# Patient Record
Sex: Female | Born: 1970
Health system: Southern US, Community
[De-identification: ages and names within clinical notes are randomized; demographics above are authoritative.]

## PROBLEM LIST (undated history)

## (undated) DIAGNOSIS — Z8049 Family history of malignant neoplasm of other genital organs: Secondary | ICD-10-CM

## (undated) DIAGNOSIS — M47819 Spondylosis without myelopathy or radiculopathy, site unspecified: Secondary | ICD-10-CM

## (undated) DIAGNOSIS — Z803 Family history of malignant neoplasm of breast: Secondary | ICD-10-CM

## (undated) DIAGNOSIS — G4700 Insomnia, unspecified: Secondary | ICD-10-CM

## (undated) DIAGNOSIS — F32A Depression, unspecified: Secondary | ICD-10-CM

## (undated) DIAGNOSIS — G44209 Tension-type headache, unspecified, not intractable: Secondary | ICD-10-CM

## (undated) DIAGNOSIS — F329 Major depressive disorder, single episode, unspecified: Secondary | ICD-10-CM

## (undated) DIAGNOSIS — K219 Gastro-esophageal reflux disease without esophagitis: Secondary | ICD-10-CM

## (undated) DIAGNOSIS — G5690 Unspecified mononeuropathy of unspecified upper limb: Secondary | ICD-10-CM

## (undated) DIAGNOSIS — F419 Anxiety disorder, unspecified: Secondary | ICD-10-CM

## (undated) DIAGNOSIS — Z95828 Presence of other vascular implants and grafts: Secondary | ICD-10-CM

## (undated) DIAGNOSIS — Z8719 Personal history of other diseases of the digestive system: Secondary | ICD-10-CM

## (undated) DIAGNOSIS — D649 Anemia, unspecified: Secondary | ICD-10-CM

## (undated) DIAGNOSIS — K589 Irritable bowel syndrome without diarrhea: Secondary | ICD-10-CM

## (undated) DIAGNOSIS — M722 Plantar fascial fibromatosis: Secondary | ICD-10-CM

## (undated) DIAGNOSIS — E785 Hyperlipidemia, unspecified: Secondary | ICD-10-CM

## (undated) DIAGNOSIS — C569 Malignant neoplasm of unspecified ovary: Secondary | ICD-10-CM

## (undated) DIAGNOSIS — K5900 Constipation, unspecified: Secondary | ICD-10-CM

## (undated) DIAGNOSIS — G43909 Migraine, unspecified, not intractable, without status migrainosus: Secondary | ICD-10-CM

## (undated) DIAGNOSIS — Z87442 Personal history of urinary calculi: Secondary | ICD-10-CM

## (undated) HISTORY — DX: Anxiety disorder, unspecified: F41.9

## (undated) HISTORY — DX: Plantar fascial fibromatosis: M72.2

## (undated) HISTORY — DX: Family history of malignant neoplasm of other genital organs: Z80.49

## (undated) HISTORY — DX: Irritable bowel syndrome, unspecified: K58.9

## (undated) HISTORY — PX: TUBAL LIGATION: SHX77

## (undated) HISTORY — DX: Insomnia, unspecified: G47.00

## (undated) HISTORY — DX: Major depressive disorder, single episode, unspecified: F32.9

## (undated) HISTORY — PX: ABDOMINAL HYSTERECTOMY: SHX81

## (undated) HISTORY — DX: Family history of malignant neoplasm of breast: Z80.3

## (undated) HISTORY — DX: Hyperlipidemia, unspecified: E78.5

## (undated) HISTORY — DX: Depression, unspecified: F32.A

## (undated) HISTORY — PX: OTHER SURGICAL HISTORY: SHX169

## (undated) HISTORY — PX: ESOPHAGOGASTRODUODENOSCOPY: SHX5428

## (undated) HISTORY — DX: Spondylosis without myelopathy or radiculopathy, site unspecified: M47.819

## (undated) HISTORY — DX: Tension-type headache, unspecified, not intractable: G44.209

## (undated) HISTORY — DX: Migraine, unspecified, not intractable, without status migrainosus: G43.909

---
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
## 1998-04-21 ENCOUNTER — Other Ambulatory Visit: Admission: RE | Admit: 1998-04-21 | Discharge: 1998-04-21 | Payer: Self-pay | Admitting: Obstetrics and Gynecology

## 2003-12-01 ENCOUNTER — Ambulatory Visit (HOSPITAL_COMMUNITY): Admission: RE | Admit: 2003-12-01 | Discharge: 2003-12-01 | Payer: Self-pay | Admitting: Obstetrics & Gynecology

## 2005-01-21 ENCOUNTER — Emergency Department (HOSPITAL_COMMUNITY): Admission: EM | Admit: 2005-01-21 | Discharge: 2005-01-21 | Payer: Self-pay | Admitting: Emergency Medicine

## 2006-01-14 HISTORY — PX: CHOLECYSTECTOMY: SHX55

## 2007-12-04 ENCOUNTER — Other Ambulatory Visit: Admission: RE | Admit: 2007-12-04 | Discharge: 2007-12-04 | Payer: Self-pay | Admitting: Obstetrics & Gynecology

## 2009-01-23 ENCOUNTER — Other Ambulatory Visit: Admission: RE | Admit: 2009-01-23 | Discharge: 2009-01-23 | Payer: Self-pay | Admitting: Obstetrics & Gynecology

## 2009-10-30 ENCOUNTER — Ambulatory Visit (HOSPITAL_COMMUNITY): Admission: RE | Admit: 2009-10-30 | Discharge: 2009-10-30 | Payer: Self-pay | Admitting: Obstetrics & Gynecology

## 2010-04-02 DIAGNOSIS — R072 Precordial pain: Secondary | ICD-10-CM

## 2010-06-01 NOTE — Op Note (Signed)
Michelle Holt, Michelle Holt                  ACCOUNT NO.:  [id number]   MEDICAL RECORD NO.:  [id number]          PATIENT TYPE:  AMB   LOCATION:  DAY                           FACILITY:  [facility]   PHYSICIAN:  Lazaro Arms, M.D.   DATE OF BIRTH:  1970/10/28   DATE OF PROCEDURE:  12/01/2003  DATE OF DISCHARGE:                                 OPERATIVE REPORT   PREOPERATIVE DIAGNOSES:  1.  Menometrorrhagia.  2.  Dysmenorrhea.   POSTOPERATIVE DIAGNOSES:  1.  Menometrorrhagia.  2.  Dysmenorrhea.   PROCEDURE:  Hysteroscopy, dilatation and curettage, with endometrial  ablation.   SURGEON:  Lazaro Arms, M.D.   ANESTHESIA:  Laryngeal mask airway.   ESTIMATED BLOOD LOSS:  None.   FINDINGS:  The patient had had prolonged heavy vaginal bleeding for some  time, she responded nicely to Megace, and also painful cramping.  She is  admitted for hysteroscopy, D&C, and endometrial ablation.  At the time of  ablation, she had no abnormal findings.   DESCRIPTION OF OPERATION:  The patient was taken to the operating room and  placed in the supine position, where she underwent a laryngeal mask airway,  placed in the dorsal lithotomy position, and prepped and draped in the usual  sterile fashion.  A Graves speculum was placed.  The cervix was grasped with  a single-tooth tenaculum.  Marcaine 0.5% with 1:200,000 epinephrine was  injected as a paracervical block on either side.  The uterus sounded to 8  cm.  The cervix was dilated serially to allow passage of the hysteroscope.  Hysteroscopy was performed and normal findings were seen.  A vigorous  uterine curettage was performed, specimen was sent to the lab.  Balloon  endometrial ablation was performed.  It required 17 mL of D5W heated to 87  degrees Celsius for a total therapy time of 9 minutes 9 seconds.  All fluid  was recovered at the end of the procedure.  The patient tolerated the  procedure well.  She experienced minimal blood loss, taken to  recovery in  good, stable condition.  All counts were correct.  She received Ancef and  Toradol prophylactically.     Luth  LHE/MEDQ  D:  12/01/2003  T:  12/01/2003  Job:  [id number]

## 2010-06-01 NOTE — H&P (Signed)
NAME:  Michelle Holt                  ACCOUNT NO.:  [id number]   MEDICAL RECORD NO.:  [id number]          PATIENT TYPE:  AMB   LOCATION:  DAY                           FACILITY:  [facility]   PHYSICIAN:  Lazaro Arms, M.D.   DATE OF BIRTH:  28-Jun-1970   DATE OF ADMISSION:  DATE OF DISCHARGE:  LH                                HISTORY & PHYSICAL   HISTORY OF PRESENT ILLNESS:  Michelle Holt is a 40 year old white female, gravida 4,  para 2, AB 2 with last menstrual period August 21, 2003.  She has been on  Megace since that time for suppression of her menses.  The patient presented  initially to our office in early September complaining of a painful  menstrual period and heavy bleeding with clots, significantly disrupting her  life.  As a result. she had been placed on Megace at my request prior to  that time, and she responded nicely to that, being basically amenorrheic.  The patient is status post a tubal and wants definitive therapy for this  problem.   PAST MEDICAL HISTORY:  Negative.   PAST SURGICAL HISTORY:  Tubal ligation in 2002.   ALLERGIES:  CODEINE AND PENICILLIN.   PAST OB HISTORY:  She is a gravida 4, para 2, AB 2.   MEDICATIONS:  Megace.   PHYSICAL EXAMINATION:  HEENT:  Unremarkable.  Thyroid is normal.  LUNGS:  Clear.  HEART:  Regular rate and rhythm without murmurs, rubs or gallops.  BREASTS:  Without masses, discharge or skin changes.  ABDOMEN:  Benign. No hepatosplenomegaly or masses.  GU:  She has normal external genitalia.  The vagina is without discharge.  Cervix is parous without lesions.  The uterus is normal size, shape and  contour.  The ovaries are normal and nontender.   IMPRESSION:  1.  Hypermenorrhea.  2.  Dysmenorrhea.   PLAN:  The patient is admitted for hysterotomy, D&C and endometrial  ablation.  She understands the risks, benefits, indications and alternatives  and will proceed.     Luth   LHE/MEDQ  D:  11/30/2003  T:  11/30/2003  Job:  [id number]   cc:    [facility]  Fax: [phone]

## 2010-09-11 ENCOUNTER — Other Ambulatory Visit (HOSPITAL_COMMUNITY)
Admission: RE | Admit: 2010-09-11 | Discharge: 2010-09-11 | Disposition: A | Discharge status: Home / Self Care | Payer: BC Managed Care – PPO | Source: Ambulatory Visit | Attending: Obstetrics & Gynecology | Admitting: Obstetrics & Gynecology

## 2010-09-11 ENCOUNTER — Other Ambulatory Visit: Payer: Self-pay | Admitting: Obstetrics & Gynecology

## 2010-09-11 DIAGNOSIS — Z01419 Encounter for gynecological examination (general) (routine) without abnormal findings: Secondary | ICD-10-CM | POA: Insufficient documentation

## 2010-11-15 ENCOUNTER — Other Ambulatory Visit: Payer: Self-pay | Admitting: Obstetrics & Gynecology

## 2010-11-15 DIAGNOSIS — Z139 Encounter for screening, unspecified: Secondary | ICD-10-CM

## 2010-11-26 ENCOUNTER — Ambulatory Visit (HOSPITAL_COMMUNITY)
Admission: RE | Admit: 2010-11-26 | Discharge: 2010-11-26 | Disposition: A | Discharge status: Home / Self Care | Payer: BC Managed Care – PPO | Source: Ambulatory Visit | Attending: Obstetrics & Gynecology | Admitting: Obstetrics & Gynecology

## 2010-11-26 DIAGNOSIS — Z1231 Encounter for screening mammogram for malignant neoplasm of breast: Secondary | ICD-10-CM | POA: Insufficient documentation

## 2010-11-26 DIAGNOSIS — Z139 Encounter for screening, unspecified: Secondary | ICD-10-CM

## 2011-10-23 DIAGNOSIS — M47817 Spondylosis without myelopathy or radiculopathy, lumbosacral region: Secondary | ICD-10-CM | POA: Insufficient documentation

## 2011-10-23 DIAGNOSIS — G43019 Migraine without aura, intractable, without status migrainosus: Secondary | ICD-10-CM | POA: Insufficient documentation

## 2011-11-06 ENCOUNTER — Other Ambulatory Visit: Payer: Self-pay | Admitting: Neurology

## 2011-11-06 DIAGNOSIS — M47817 Spondylosis without myelopathy or radiculopathy, lumbosacral region: Secondary | ICD-10-CM

## 2011-11-11 ENCOUNTER — Ambulatory Visit
Admission: RE | Admit: 2011-11-11 | Discharge: 2011-11-11 | Disposition: A | Discharge status: Home / Self Care | Payer: BC Managed Care – PPO | Source: Ambulatory Visit | Attending: Neurology | Admitting: Neurology

## 2011-11-11 DIAGNOSIS — M47817 Spondylosis without myelopathy or radiculopathy, lumbosacral region: Secondary | ICD-10-CM

## 2011-11-11 MED ORDER — IOHEXOL 180 MG/ML  SOLN
1.0000 mL | Freq: Once | INTRAMUSCULAR | Status: AC | PRN
  Administered 2011-11-11: 1 mL via INTRA_ARTICULAR

## 2011-12-19 ENCOUNTER — Other Ambulatory Visit: Payer: Self-pay | Admitting: Obstetrics & Gynecology

## 2011-12-19 DIAGNOSIS — Z139 Encounter for screening, unspecified: Secondary | ICD-10-CM

## 2011-12-23 ENCOUNTER — Ambulatory Visit (HOSPITAL_COMMUNITY)
Admission: RE | Admit: 2011-12-23 | Discharge: 2011-12-23 | Disposition: A | Discharge status: Home / Self Care | Payer: BC Managed Care – PPO | Source: Ambulatory Visit | Attending: Obstetrics & Gynecology | Admitting: Obstetrics & Gynecology

## 2011-12-23 ENCOUNTER — Other Ambulatory Visit: Payer: Self-pay | Admitting: Obstetrics & Gynecology

## 2011-12-23 ENCOUNTER — Other Ambulatory Visit (HOSPITAL_COMMUNITY)
Admission: RE | Admit: 2011-12-23 | Discharge: 2011-12-23 | Disposition: A | Discharge status: Home / Self Care | Payer: BC Managed Care – PPO | Source: Ambulatory Visit | Attending: Obstetrics & Gynecology | Admitting: Obstetrics & Gynecology

## 2011-12-23 DIAGNOSIS — Z139 Encounter for screening, unspecified: Secondary | ICD-10-CM

## 2011-12-23 DIAGNOSIS — R922 Inconclusive mammogram: Secondary | ICD-10-CM | POA: Insufficient documentation

## 2011-12-23 DIAGNOSIS — Z01419 Encounter for gynecological examination (general) (routine) without abnormal findings: Secondary | ICD-10-CM | POA: Insufficient documentation

## 2011-12-25 ENCOUNTER — Other Ambulatory Visit: Payer: Self-pay | Admitting: Obstetrics & Gynecology

## 2011-12-25 DIAGNOSIS — R928 Other abnormal and inconclusive findings on diagnostic imaging of breast: Secondary | ICD-10-CM

## 2012-01-07 ENCOUNTER — Ambulatory Visit (INDEPENDENT_AMBULATORY_CARE_PROVIDER_SITE_OTHER): Payer: BC Managed Care – PPO | Admitting: Urology

## 2012-01-07 DIAGNOSIS — N281 Cyst of kidney, acquired: Secondary | ICD-10-CM

## 2012-01-29 ENCOUNTER — Ambulatory Visit (HOSPITAL_COMMUNITY)
Admission: RE | Admit: 2012-01-29 | Discharge: 2012-01-29 | Disposition: A | Discharge status: Home / Self Care | Payer: BC Managed Care – PPO | Source: Ambulatory Visit | Attending: Obstetrics & Gynecology | Admitting: Obstetrics & Gynecology

## 2012-01-29 ENCOUNTER — Other Ambulatory Visit (HOSPITAL_COMMUNITY): Payer: Self-pay | Admitting: Obstetrics & Gynecology

## 2012-01-29 DIAGNOSIS — R928 Other abnormal and inconclusive findings on diagnostic imaging of breast: Secondary | ICD-10-CM

## 2012-04-30 ENCOUNTER — Other Ambulatory Visit: Payer: Self-pay | Admitting: Neurology

## 2012-05-01 NOTE — Telephone Encounter (Signed)
Refill WID (Dr Anne Hahn out of office)

## 2012-05-07 ENCOUNTER — Encounter: Payer: Self-pay | Admitting: Neurology

## 2012-05-07 DIAGNOSIS — G43019 Migraine without aura, intractable, without status migrainosus: Secondary | ICD-10-CM

## 2012-05-07 DIAGNOSIS — M47817 Spondylosis without myelopathy or radiculopathy, lumbosacral region: Secondary | ICD-10-CM

## 2012-05-08 ENCOUNTER — Encounter: Payer: Self-pay | Admitting: Neurology

## 2012-05-08 ENCOUNTER — Ambulatory Visit (INDEPENDENT_AMBULATORY_CARE_PROVIDER_SITE_OTHER): Payer: Self-pay | Admitting: Neurology

## 2012-05-08 VITALS — BP 104/80 | HR 68 | Ht 65.5 in | Wt 162.0 lb

## 2012-05-08 DIAGNOSIS — G43019 Migraine without aura, intractable, without status migrainosus: Secondary | ICD-10-CM

## 2012-05-08 DIAGNOSIS — M47817 Spondylosis without myelopathy or radiculopathy, lumbosacral region: Secondary | ICD-10-CM

## 2012-05-08 MED ORDER — VERAPAMIL HCL ER 120 MG PO TBCR: 120.0000 mg | EXTENDED_RELEASE_TABLET | Freq: Every day | ORAL | Status: DC

## 2012-05-08 MED ORDER — DIAZEPAM 5 MG PO TABS: 5.0000 mg | ORAL_TABLET | Freq: Every evening | ORAL | Status: DC | PRN

## 2012-05-08 MED ORDER — SUMATRIPTAN 20 MG/ACT NA SOLN: 1.0000 | NASAL | Status: DC | PRN

## 2012-05-08 MED ORDER — OXYCODONE-ACETAMINOPHEN 7.5-325 MG PO TABS: 1.0000 | ORAL_TABLET | Freq: Four times a day (QID) | ORAL | Status: DC | PRN

## 2012-05-08 NOTE — Progress Notes (Signed)
Reason for visit: Migraine  Michelle Holt is an 42 y.o. female  History of present illness:  [name] is a 42 year old right-handed white female with a history of migraine headaches. The patient has been on nortriptyline, currently taking 60 mg at night. The patient indicates that she is unable to tolerate higher doses. The patient is having a lot of blurred vision and dry mouth from the medication, but she indicates that the nortriptyline helps her sleep at night, and it has reduced the frequency of her headache. The patient is having on average about 5 headaches a month, but the headaches may make her feel bad for 2 days. The patient is not missing much work at this time because of the headache. The patient had evidence of L5-S1 facet joint arthritis, worse on the left, and she received an injection into the facet joint in the fall of 2013 with good improvement in her back pain. The patient is still working long hours, and this is adding stress to her life. The patient returns for an evaluation.  Past Medical History  Diagnosis Date  . Migraine   . Muscle tension headache   . Anxiety and depression   . Dyslipidemia   . Irritable bowel syndrome   . Insomnia   . Arthritis of facet joints at multiple vertebral levels     L5-S1    Past Surgical History  Procedure Laterality Date  . Cholecystectomy    . Uterine ablation    . Tubal ligation Bilateral     Family History  Problem Relation Age of Onset  . Hypertension Mother   . Obesity Mother   . Peripheral vascular disease Father   . Cancer - Ovarian Sister   . COPD Brother   . Osteoporosis Brother     Social history:  reports that she has been smoking Cigarettes.  She has a 8.5 pack-year smoking history. She has never used smokeless tobacco. She reports that  drinks alcohol. She reports that she does not use illicit drugs.  Allergies:  Allergies  Allergen Reactions  . Zithromax (Azithromycin)   . Actifed Cold-Allergy  (Chlorpheniramine-Phenyleph Er) Rash  . Amoxicillin Rash  . Codeine Hives  . Erythromycin Rash  . Penicillins Rash  . Sudafed (Pseudoephedrine Hcl) Rash    Medications:  Current Outpatient Prescriptions on File Prior to Visit  Medication Sig Dispense Refill  . nortriptyline (PAMELOR) 25 MG capsule Take 25 mg by mouth 2 (two) times daily. TAKE TWO (2) TABLETS AT BEDTIME      . promethazine (PHENERGAN) 25 MG tablet Take 25 mg by mouth every 6 (six) hours as needed for nausea.      . SUMAtriptan (IMITREX) 100 MG tablet Take 100 mg by mouth 2 (two) times daily as needed for migraine.      . traMADol (ULTRAM) 50 MG tablet Take 50 mg by mouth every 6 (six) hours as needed for pain.       No current facility-administered medications on file prior to visit.    ROS:  Out of a complete 14 system review of symptoms, the patient complains only of the following symptoms, and all other reviewed systems are negative.  Weight gain Snoring Constipation Increased thirst, dry mouth  Blood pressure 104/80, pulse 68, height 5' 5.5" (1.664 m), weight 162 lb (73.483 kg).  Physical Exam  General: The patient is alert and cooperative at the time of the examination. The patient is minimally obese.  Skin: No significant peripheral edema  is noted.   Neurologic Exam  Cranial nerves: Facial symmetry is present. Speech is normal, no aphasia or dysarthria is noted. Extraocular movements are full. Visual fields are full.  Motor: The patient has good strength in all 4 extremities.  Coordination: The patient has good finger-nose-finger and heel-to-shin bilaterally.  Gait and station: The patient has a normal gait. Tandem gait is normal. Romberg is negative. No drift is seen.  Reflexes: Deep tendon reflexes are symmetric.   Assessment/Plan:  1. Migraine headache  2. Low back pain, L5-S1 facet joint arthritis, left greater than right  The patient is improving some with the headache, but the  headache remains a problem. The patient indicates that often times she will throw up the Imitrex tablet during the headache. The patient will be switched to the Imitrex nasal spray. In the past, she could not afford the injectable Imitrex. The patient in the past has not responded well to Botox, and she has been on multiple medications such as propranolol without benefit. The patient will be placed on verapamil taking 120 mg daily. The patient will be given the Imitrex nasal spray, and she will followup in 4 or 5 months. The patient will contact our office if there are tolerance issues with the verapamil.  Marlan Palau MD 05/11/2012 8:43 AM  [facility] [address] [place], [place] [address]  Phone [phone] Fax [phone]

## 2012-07-02 ENCOUNTER — Other Ambulatory Visit: Payer: Self-pay

## 2012-07-02 MED ORDER — VERAPAMIL HCL ER 120 MG PO TBCR: 120.0000 mg | EXTENDED_RELEASE_TABLET | Freq: Every day | ORAL | Status: DC

## 2012-07-06 ENCOUNTER — Telehealth: Payer: Self-pay

## 2012-07-06 NOTE — Telephone Encounter (Signed)
I returned this patients call and got her voice mail. I requested that patient call back and we will look at scheduling her for another appointment.

## 2012-07-06 NOTE — Telephone Encounter (Signed)
Message copied by [facility] on Mon Jul 06, 2012  4:22 PM ------      Message from: Eugenie Birks      Created: Fri Jul 03, 2012  8:29 AM      Contact: patient       Pt states she needs another cortizone shot in her back because she can barely walk and the pain is radiating all the way down to her foot. ------

## 2012-07-10 ENCOUNTER — Telehealth: Payer: Self-pay

## 2012-07-10 DIAGNOSIS — M545 Low back pain, unspecified: Secondary | ICD-10-CM

## 2012-07-10 NOTE — Telephone Encounter (Signed)
This is a patient of Dr. Anne Hahn. He referred her for Cortisone injections last year. She found that they were very effective. After her last visit with Dr. Anne Hahn where she said she was feeling good, the pain began again. She is requesting a new referral be made as she has now been in significant pain for the last two months. Thank you.

## 2012-07-10 NOTE — Telephone Encounter (Signed)
I will reorder the injection on the left L4-5 facet joint.

## 2012-07-14 ENCOUNTER — Other Ambulatory Visit: Payer: Self-pay | Admitting: Neurology

## 2012-07-14 DIAGNOSIS — M545 Low back pain, unspecified: Secondary | ICD-10-CM

## 2012-07-15 ENCOUNTER — Other Ambulatory Visit: Payer: Self-pay | Admitting: Obstetrics & Gynecology

## 2012-07-15 DIAGNOSIS — Z09 Encounter for follow-up examination after completed treatment for conditions other than malignant neoplasm: Secondary | ICD-10-CM

## 2012-07-29 ENCOUNTER — Ambulatory Visit (HOSPITAL_COMMUNITY)
Admission: RE | Admit: 2012-07-29 | Discharge: 2012-07-29 | Disposition: A | Discharge status: Home / Self Care | Payer: BC Managed Care – PPO | Source: Ambulatory Visit | Attending: Obstetrics & Gynecology | Admitting: Obstetrics & Gynecology

## 2012-07-29 ENCOUNTER — Ambulatory Visit
Admission: RE | Admit: 2012-07-29 | Discharge: 2012-07-29 | Disposition: A | Discharge status: Home / Self Care | Payer: BC Managed Care – PPO | Source: Ambulatory Visit | Attending: Neurology | Admitting: Neurology

## 2012-07-29 DIAGNOSIS — M545 Low back pain, unspecified: Secondary | ICD-10-CM

## 2012-07-29 DIAGNOSIS — Z09 Encounter for follow-up examination after completed treatment for conditions other than malignant neoplasm: Secondary | ICD-10-CM

## 2012-07-29 DIAGNOSIS — R928 Other abnormal and inconclusive findings on diagnostic imaging of breast: Secondary | ICD-10-CM | POA: Insufficient documentation

## 2012-07-29 MED ORDER — IOHEXOL 180 MG/ML  SOLN
1.0000 mL | Freq: Once | INTRAMUSCULAR | Status: AC | PRN
  Administered 2012-07-29: 1 mL via EPIDURAL

## 2012-07-29 MED ORDER — METHYLPREDNISOLONE ACETATE 40 MG/ML INJ SUSP (RADIOLOG: 120.0000 mg | Freq: Once | INTRAMUSCULAR | Status: DC

## 2012-07-30 DIAGNOSIS — Z0289 Encounter for other administrative examinations: Secondary | ICD-10-CM

## 2012-08-10 ENCOUNTER — Telehealth: Payer: Self-pay | Admitting: Neurology

## 2012-08-10 ENCOUNTER — Other Ambulatory Visit: Payer: Self-pay | Admitting: Neurology

## 2012-08-10 MED ORDER — OXYCODONE-ACETAMINOPHEN 7.5-325 MG PO TABS: 1.0000 | ORAL_TABLET | Freq: Four times a day (QID) | ORAL | Status: DC | PRN

## 2012-08-10 NOTE — Telephone Encounter (Signed)
The patient called and left a message to indicate that she is increasing her nortriptyline to 70 mg at night. The patient may have another refill on her oxycodone. I'll order this. I did not call the patient.

## 2012-08-10 NOTE — Telephone Encounter (Signed)
Pharmacy sent request for Ultram.  Would you like to fill?  Please advise.  Thank you.

## 2012-08-10 NOTE — Telephone Encounter (Signed)
needs to let Dr. Anne Hahn know her daily headaches have come back and she is increasing her nortriptyline to 70mg . she is also out of her percocet because she used some before she had her shot for her leg and back. please refill. please mail RX.

## 2012-08-28 ENCOUNTER — Encounter: Payer: Self-pay | Admitting: Neurology

## 2012-08-28 ENCOUNTER — Ambulatory Visit (INDEPENDENT_AMBULATORY_CARE_PROVIDER_SITE_OTHER): Payer: BC Managed Care – PPO | Admitting: Neurology

## 2012-08-28 VITALS — BP 102/69 | HR 85 | Ht 66.0 in | Wt 162.0 lb

## 2012-08-28 DIAGNOSIS — G43019 Migraine without aura, intractable, without status migrainosus: Secondary | ICD-10-CM

## 2012-08-28 DIAGNOSIS — M47817 Spondylosis without myelopathy or radiculopathy, lumbosacral region: Secondary | ICD-10-CM

## 2012-08-28 MED ORDER — NORTRIPTYLINE HCL 75 MG PO CAPS: 75.0000 mg | ORAL_CAPSULE | Freq: Every day | ORAL | Status: DC

## 2012-08-28 NOTE — Progress Notes (Signed)
Reason for visit: Headache  Michelle Holt is an 42 y.o. female  History of present illness:  Michelle Holt is a 42 year old right-handed white female with a history of migraine headache, muscle tension headache, and lumbosacral spondylosis. The patient has significant L5-S1 level facet joint arthritis that has responded in the past to injections. The patient recently was diagnosed with right-sided plantar fasciitis, and a heel spur. The patient received an injection for this, and she is doing somewhat better. The patient was placed on diclofenac for the plantar fasciitis. The patient has recently been under a lot of stress with an illness involving her mother. The patient had worsening of headaches, and the nortriptyline dose was increased to 70 mg at night. The patient has had an improvement in the daily headaches, but she continues to have occasional migraine. The patient takes Imitrex for migraine, with some benefit. The patient returns to this office for an evaluation.  Past Medical History  Diagnosis Date  . Migraine   . Muscle tension headache   . Anxiety and depression   . Dyslipidemia   . Irritable bowel syndrome   . Insomnia   . Arthritis of facet joints at multiple vertebral levels     L5-S1  . Plantar fasciitis of right foot     Past Surgical History  Procedure Laterality Date  . Cholecystectomy    . Uterine ablation    . Tubal ligation Bilateral     Family History  Problem Relation Age of Onset  . Hypertension Mother   . Obesity Mother   . Peripheral vascular disease Father   . Cancer - Ovarian Sister   . COPD Brother   . Osteoporosis Brother     Social history:  reports that she has been smoking Cigarettes.  She has a 8.5 pack-year smoking history. She has never used smokeless tobacco. She reports that  drinks alcohol. She reports that she does not use illicit drugs.  Allergies:  Allergies  Allergen Reactions  . Actifed Cold-Allergy [Chlorpheniramine-Phenyleph  Er] Rash  . Amoxicillin Rash  . Codeine Hives  . Erythromycin Rash  . Penicillins Rash  . Sudafed [Pseudoephedrine Hcl] Rash  . Zithromax [Azithromycin] Rash    Medications:  Current Outpatient Prescriptions on File Prior to Visit  Medication Sig Dispense Refill  . diazepam (VALIUM) 5 MG tablet Take 1 tablet (5 mg total) by mouth at bedtime as needed for anxiety.  30 tablet  5  . oxyCODONE-acetaminophen (PERCOCET) 7.5-325 MG per tablet Take 1 tablet by mouth every 6 (six) hours as needed for pain (Must last 28 days).  40 tablet  0  . promethazine (PHENERGAN) 25 MG tablet Take 25 mg by mouth every 6 (six) hours as needed for nausea.      . SUMAtriptan (IMITREX) 100 MG tablet Take 100 mg by mouth 2 (two) times daily as needed for migraine.      . SUMAtriptan (IMITREX) 20 MG/ACT nasal spray Place 1 spray (20 mg total) into the nose every 2 (two) hours as needed for migraine (No more than 2 sprays in a 24 hour period).  1 Inhaler  3  . traMADol (ULTRAM) 50 MG tablet TAKE 1 TABLET BY MOUTH EVERY 6 HOURS AS NEEDED **MUST LAST 28 DAYS  50 tablet  5  . verapamil (CALAN-SR) 120 MG CR tablet Take 1 tablet (120 mg total) by mouth at bedtime.  90 tablet  1   No current facility-administered medications on file prior to visit.  ROS:  Out of a complete 14 system review of symptoms, the patient complains only of the following symptoms, and all other reviewed systems are negative.  Constipation Headache Snoring  Blood pressure 102/69, pulse 85, height 5\' 6"  (1.676 m), weight 162 lb (73.483 kg).  Physical Exam  General: The patient is alert and cooperative at the time of the examination. The patient is minimally obese.  Neuromuscular: The patient has good range of movement of the low back.  Skin: No significant peripheral edema is noted.   Neurologic Exam  Cranial nerves: Facial symmetry is present. Speech is normal, no aphasia or dysarthria is noted. Extraocular movements are full.  Visual fields are full.  Motor: The patient has good strength in all 4 extremities.  Coordination: The patient has good finger-nose-finger and heel-to-shin bilaterally.  Gait and station: The patient has a normal gait. Tandem gait is normal. Romberg is negative. No drift is seen.  Reflexes: Deep tendon reflexes are symmetric.   Assessment/Plan:  One. Migraine headache  2. Muscle tension headache  3. Lumbosacral spondylosis, low back pain  4. Plantar fasciitis, right  The patient is doing better at this time. The patient will be placed on a 75 mg capsule of nortriptyline, and she will continue taking Imitrex if needed. The patient has oxycodone for pain if needed. The patient will followup through this office in 6 months. The patient has received good benefit from the steroid injections of the low back. The last injection was on 07/14/2012, injecting the left L5-S1 facet joint.  Marlan Palau MD 08/30/2012 2:33 PM  [facility] [address] [place], [place] [address]  Phone [phone] Fax [phone]

## 2012-09-20 ENCOUNTER — Other Ambulatory Visit: Payer: Self-pay | Admitting: Neurology

## 2012-09-22 ENCOUNTER — Other Ambulatory Visit: Payer: Self-pay

## 2012-09-22 MED ORDER — TRAMADOL HCL 50 MG PO TABS: 50.0000 mg | ORAL_TABLET | Freq: Four times a day (QID) | ORAL | Status: DC | PRN

## 2012-09-22 NOTE — Telephone Encounter (Signed)
Rx signed and faxed.

## 2012-09-22 NOTE — Telephone Encounter (Signed)
[facility] sent a fax saying they need a new Rx for Tramadol, as it is now a C-IV controlled substance (as of 08/31/2012).  The Rx they had on file had to be voided because it was dated prior to this change.

## 2012-09-29 ENCOUNTER — Ambulatory Visit: Payer: BC Managed Care – PPO | Admitting: Neurology

## 2012-10-08 ENCOUNTER — Other Ambulatory Visit: Payer: Self-pay | Admitting: Neurology

## 2012-10-22 ENCOUNTER — Other Ambulatory Visit: Payer: Self-pay | Admitting: Neurology

## 2012-11-23 ENCOUNTER — Telehealth: Payer: Self-pay | Admitting: Neurology

## 2012-11-27 ENCOUNTER — Other Ambulatory Visit: Payer: Self-pay

## 2012-11-27 MED ORDER — OXYCODONE-ACETAMINOPHEN 7.5-325 MG PO TABS: 1.0000 | ORAL_TABLET | Freq: Four times a day (QID) | ORAL | Status: DC | PRN

## 2012-11-27 NOTE — Telephone Encounter (Signed)
Patient requests that Rx be mailed.

## 2012-11-30 NOTE — Telephone Encounter (Signed)
I have not had access to Epic for over 2 weeks.  By viewing the chart, this Rx was processed on 11/14.

## 2012-12-01 ENCOUNTER — Other Ambulatory Visit: Payer: Self-pay | Admitting: Neurology

## 2012-12-01 MED ORDER — OXYCODONE-ACETAMINOPHEN 7.5-325 MG PO TABS: 1.0000 | ORAL_TABLET | Freq: Four times a day (QID) | ORAL | Status: DC | PRN

## 2012-12-01 NOTE — Progress Notes (Signed)
The oxycodone prescription was rewritten today. The patient never received the prescription from 11/27/2012.

## 2012-12-02 ENCOUNTER — Other Ambulatory Visit: Payer: Self-pay | Admitting: Obstetrics & Gynecology

## 2012-12-02 ENCOUNTER — Other Ambulatory Visit: Payer: Self-pay | Admitting: Neurology

## 2012-12-02 DIAGNOSIS — Z139 Encounter for screening, unspecified: Secondary | ICD-10-CM

## 2012-12-02 NOTE — Telephone Encounter (Signed)
Faxed to pharmacy

## 2012-12-02 NOTE — Telephone Encounter (Signed)
Diazepam faxed to pharmacy. 

## 2012-12-02 NOTE — Telephone Encounter (Signed)
Patient has received Rx

## 2012-12-28 ENCOUNTER — Ambulatory Visit (HOSPITAL_COMMUNITY): Payer: BC Managed Care – PPO

## 2013-01-04 ENCOUNTER — Ambulatory Visit (HOSPITAL_COMMUNITY)
Admission: RE | Admit: 2013-01-04 | Discharge: 2013-01-04 | Disposition: A | Discharge status: Home / Self Care | Payer: BC Managed Care – PPO | Source: Ambulatory Visit | Attending: Obstetrics & Gynecology | Admitting: Obstetrics & Gynecology

## 2013-01-04 DIAGNOSIS — Z139 Encounter for screening, unspecified: Secondary | ICD-10-CM

## 2013-01-04 DIAGNOSIS — Z1231 Encounter for screening mammogram for malignant neoplasm of breast: Secondary | ICD-10-CM | POA: Insufficient documentation

## 2013-01-19 ENCOUNTER — Encounter: Payer: Self-pay | Admitting: Obstetrics & Gynecology

## 2013-01-19 ENCOUNTER — Ambulatory Visit (INDEPENDENT_AMBULATORY_CARE_PROVIDER_SITE_OTHER): Payer: BC Managed Care – PPO | Admitting: Obstetrics & Gynecology

## 2013-01-19 ENCOUNTER — Other Ambulatory Visit (HOSPITAL_COMMUNITY)
Admission: RE | Admit: 2013-01-19 | Discharge: 2013-01-19 | Disposition: A | Discharge status: Home / Self Care | Payer: BC Managed Care – PPO | Source: Ambulatory Visit | Attending: Obstetrics & Gynecology | Admitting: Obstetrics & Gynecology

## 2013-01-19 VITALS — BP 102/80 | Ht 63.4 in | Wt 171.0 lb

## 2013-01-19 DIAGNOSIS — Z1151 Encounter for screening for human papillomavirus (HPV): Secondary | ICD-10-CM | POA: Insufficient documentation

## 2013-01-19 DIAGNOSIS — Z01419 Encounter for gynecological examination (general) (routine) without abnormal findings: Secondary | ICD-10-CM | POA: Insufficient documentation

## 2013-01-19 NOTE — Progress Notes (Signed)
Patient ID: Michelle Holt, female   DOB: 1970/07/16, 43 y.o.   MRN: [id number] Subjective:     Michelle Holt is a 43 y.o. female here for a routine exam.  No LMP recorded. Patient has had an ablation. No obstetric history on file. Birth Control Method:  Ablation  Menstrual Calendar(currently): amenorrheic  Current complaints: none.   Current acute medical issues:  migraines   Recent Gynecologic History No LMP recorded. Patient has had an ablation. Last Pap: 2013,  normal Last mammogram: 12/2012,  normal  Past Medical History  Diagnosis Date  . Migraine   . Muscle tension headache   . Anxiety and depression   . Dyslipidemia   . Irritable bowel syndrome   . Insomnia   . Arthritis of facet joints at multiple vertebral levels     L5-S1  . Plantar fasciitis of right foot     Past Surgical History  Procedure Laterality Date  . Cholecystectomy    . Uterine ablation    . Tubal ligation Bilateral     OB History   Grav Para Term Preterm Abortions TAB SAB Ect Mult Living                  History   Social History  . Marital Status: Divorced    Spouse Name: N/A    Number of Children: 2  . Years of Education: 2-College   Occupational History  .  [employer]   Social History Main Topics  . Smoking status: Current Every Day Smoker -- 0.50 packs/day for 17 years    Types: Cigarettes  . Smokeless tobacco: Never Used  . Alcohol Use: Yes     Comment: Drinks alcohol on occasion  . Drug Use: No  . Sexual Activity: None   Other Topics Concern  . None   Social History Narrative  . None    Family History  Problem Relation Age of Onset  . Hypertension Mother   . Obesity Mother   . Diabetes Mother   . Peripheral vascular disease Father   . Cancer - Ovarian Sister   . COPD Brother   . Osteoporosis Brother      Review of Systems  Review of Systems  Constitutional: Negative for fever, chills, weight loss, malaise/fatigue and diaphoresis.  HENT: Negative for  hearing loss, ear pain, nosebleeds, congestion, sore throat, neck pain, tinnitus and ear discharge.   Eyes: Negative for blurred vision, double vision, photophobia, pain, discharge and redness.  Respiratory: Negative for cough, hemoptysis, sputum production, shortness of breath, wheezing and stridor.   Cardiovascular: Negative for chest pain, palpitations, orthopnea, claudication, leg swelling and PND.  Gastrointestinal: negative for abdominal pain. Negative for heartburn, nausea, vomiting, diarrhea, constipation, blood in stool and melena.  Genitourinary: Negative for dysuria, urgency, frequency, hematuria and flank pain.  Musculoskeletal: Negative for myalgias, back pain, joint pain and falls.  Skin: Negative for itching and rash.  Neurological: Negative for dizziness, tingling, tremors, sensory change, speech change, focal weakness, seizures, loss of consciousness, weakness and headaches.  Endo/Heme/Allergies: Negative for environmental allergies and polydipsia. Does not bruise/bleed easily.  Psychiatric/Behavioral: Negative for depression, suicidal ideas, hallucinations, memory loss and substance abuse. The patient is not nervous/anxious and does not have insomnia.        Objective:    Physical Exam  Vitals reviewed. Constitutional: She is oriented to person, place, and time. She appears well-developed and well-nourished.  HENT:  Head: Normocephalic and atraumatic.  Right Ear: External ear normal.  Left Ear: External ear normal.  Nose: Nose normal.  Mouth/Throat: Oropharynx is clear and moist.  Eyes: Conjunctivae and EOM are normal. Pupils are equal, round, and reactive to light. Right eye exhibits no discharge. Left eye exhibits no discharge. No scleral icterus.  Neck: Normal range of motion. Neck supple. No tracheal deviation present. No thyromegaly present.  Cardiovascular: Normal rate, regular rhythm, normal heart sounds and intact distal pulses.  Exam reveals no gallop and  no friction rub.   No murmur heard. Respiratory: Effort normal and breath sounds normal. No respiratory distress. She has no wheezes. She has no rales. She exhibits no tenderness.  GI: Soft. Bowel sounds are normal. She exhibits no distension and no mass. There is no tenderness. There is no rebound and no guarding.  Genitourinary:  Breasts no masses skin changes or nipple changes bilaterally      Vulva is normal without lesions Vagina is pink moist without discharge Cervix normal in appearance and pap is done Uterus is normal size shape and contour Adnexa is negative with normal sized ovaries    Musculoskeletal: Normal range of motion. She exhibits no edema and no tenderness.  Neurological: She is alert and oriented to person, place, and time. She has normal reflexes. She displays normal reflexes. No cranial nerve deficit. She exhibits normal muscle tone. Coordination normal.  Skin: Skin is warm and dry. No rash noted. No erythema. No pallor.  Psychiatric: She has a normal mood and affect. Her behavior is normal. Judgment and thought content normal.       Assessment:    Healthy female exam.    Plan:    Follow up in: 1 year.

## 2013-02-03 DIAGNOSIS — Z0289 Encounter for other administrative examinations: Secondary | ICD-10-CM

## 2013-02-16 ENCOUNTER — Telehealth: Payer: Self-pay | Admitting: Neurology

## 2013-02-16 NOTE — Telephone Encounter (Signed)
LMVM for pt that FMLA sent to MD for signature.  Did receive fax # and attention to C Wright.

## 2013-02-16 NOTE — Telephone Encounter (Signed)
Patient calling to check on status of FMLA paperwork, and also is leaving the information that it needs to be to the attention of Gabriel Rung and faxed over to [phone].

## 2013-03-01 ENCOUNTER — Ambulatory Visit (INDEPENDENT_AMBULATORY_CARE_PROVIDER_SITE_OTHER): Payer: BC Managed Care – PPO | Admitting: Nurse Practitioner

## 2013-03-01 ENCOUNTER — Encounter (INDEPENDENT_AMBULATORY_CARE_PROVIDER_SITE_OTHER): Payer: Self-pay

## 2013-03-01 ENCOUNTER — Encounter: Payer: Self-pay | Admitting: Nurse Practitioner

## 2013-03-01 VITALS — BP 116/73 | HR 95 | Ht 66.0 in | Wt 176.0 lb

## 2013-03-01 DIAGNOSIS — G43019 Migraine without aura, intractable, without status migrainosus: Secondary | ICD-10-CM

## 2013-03-01 MED ORDER — TOPIRAMATE 25 MG PO TABS: ORAL_TABLET | ORAL | Status: DC

## 2013-03-01 MED ORDER — OXYCODONE-ACETAMINOPHEN 7.5-325 MG PO TABS: 1.0000 | ORAL_TABLET | Freq: Four times a day (QID) | ORAL | Status: DC | PRN

## 2013-03-01 MED ORDER — NORTRIPTYLINE HCL 75 MG PO CAPS: 75.0000 mg | ORAL_CAPSULE | Freq: Every day | ORAL | Status: DC

## 2013-03-01 MED ORDER — RIZATRIPTAN BENZOATE 10 MG PO TBDP: 10.0000 mg | ORAL_TABLET | ORAL | Status: DC | PRN

## 2013-03-01 NOTE — Progress Notes (Signed)
I have read the note, and I agree with the clinical assessment and plan.  Michelle Holt,Michelle Holt   

## 2013-03-01 NOTE — Patient Instructions (Addendum)
Topamax 25 mg at night for 1 week then increase by 25mg  weekly until dose is 100mg  Try Maxalt MLT acutely Continue Nortriptyline at current dose Continue Percocet prn  Set up for sleep study

## 2013-03-01 NOTE — Progress Notes (Signed)
[facility]  PATIENT: Michelle Holt DOB: Sep 17, 1970   REASON FOR VISIT: follow up for migraine   HISTORY OF PRESENT ILLNESS:Ms Michelle Holt, 43 year old female returns for follow up. She has a history of migraine. She was last seen by Dr. Jannifer Holt 08/28/12.  She says today her headaches have been worse in the last 4-5 months. Her mother died then her mother-in-law is currently in hospice and her husband just diagnosed with lung cancer. She says stress is  a big trigger for her. She also complains of weight gain and snoring with morning headaches.She is a shift Insurance underwriter. She does not feel the Imitrex works as well as it has in the past. She wants to try Maxalt melt again. She has stopped her Verapamil. She is not aware of any foods or other triggers except for changes in the weather. She returns for reevaluation   HISTORY:of migraine headache, muscle tension headache, and lumbosacral spondylosis. The patient has significant L5-S1 level facet joint arthritis that has responded in the past to injections. The patient recently was diagnosed with right-sided plantar fasciitis, and a heel spur. The patient received an injection for this, and she is doing somewhat better. The patient was placed on diclofenac for the plantar fasciitis. The patient has recently been under a lot of stress with an illness involving her mother. The patient had worsening of headaches, and the nortriptyline dose was increased to 70 mg at night. The patient has had an improvement in the daily headaches, but she continues to have occasional migraine. The patient takes Imitrex for migraine, with some benefit. The patient returns to this office for an evaluation.   REVIEW OF SYSTEMS: Full 14 system review of systems performed and notable only for those listed, all others are neg:  Constitutional: N/A  Cardiovascular: N/A  Ear/Nose/Throat: N/A  Skin: N/A  Eyes: N/A  Respiratory: N/A  Gastroitestinal: N/A    Hematology/Lymphatic: N/A  Endocrine: N/A Musculoskeletal:N/A  Allergy/Immunology: N/A  Neurological: Headache Psychiatric: Depression anxiety Sleep reports awakening during the night, snoring   ALLERGIES: Allergies  Allergen Reactions  . Actifed Cold-Allergy [Chlorpheniramine-Phenyleph Er] Rash  . Amoxicillin Rash  . Codeine Hives  . Erythromycin Rash  . Penicillins Rash  . Sudafed [Pseudoephedrine Hcl] Rash  . Zithromax [Azithromycin] Rash    HOME MEDICATIONS: Outpatient Prescriptions Prior to Visit  Medication Sig Dispense Refill  . diazepam (VALIUM) 5 MG tablet TAKE 1 TABLET BY MOUTH AT BEDTIME AS NEEDED FOR ANXIETY  30 tablet  5  . nortriptyline (PAMELOR) 75 MG capsule Take 1 capsule (75 mg total) by mouth at bedtime.  30 capsule  5  . oxyCODONE-acetaminophen (PERCOCET) 7.5-325 MG per tablet Take 1 tablet by mouth every 6 (six) hours as needed for pain (Must last 28 days).  40 tablet  0  . promethazine (PHENERGAN) 25 MG tablet Take 25 mg by mouth every 6 (six) hours as needed for nausea.      . SUMAtriptan (IMITREX) 100 MG tablet TAKE 1 TABLET TWICE A DAY IF NEEDED  9 tablet  5  . SUMAtriptan (IMITREX) 20 MG/ACT nasal spray Place 1 spray (20 mg total) into the nose every 2 (two) hours as needed for migraine (No more than 2 sprays in a 24 hour period).  1 Inhaler  3  . traMADol (ULTRAM) 50 MG tablet Take 1 tablet (50 mg total) by mouth every 6 (six) hours as needed (MUST LAST 28 DAYS).  50 tablet  5  .  verapamil (CALAN-SR) 120 MG CR tablet TAKE 1 TABLET (120 MG TOTAL) BY MOUTH AT BEDTIME.  90 tablet  1  . diclofenac (VOLTAREN) 75 MG EC tablet Take 75 mg by mouth 2 (two) times daily.       No facility-administered medications prior to visit.    PAST MEDICAL HISTORY: Past Medical History  Diagnosis Date  . Migraine   . Muscle tension headache   . Anxiety and depression   . Dyslipidemia   . Irritable bowel syndrome   . Insomnia   . Arthritis of facet joints at  multiple vertebral levels     L5-S1  . Plantar fasciitis of right foot     PAST SURGICAL HISTORY: Past Surgical History  Procedure Laterality Date  . Cholecystectomy    . Uterine ablation    . Tubal ligation Bilateral     FAMILY HISTORY: Family History  Problem Relation Age of Onset  . Hypertension Mother   . Obesity Mother   . Diabetes Mother   . Peripheral vascular disease Father   . Cancer - Ovarian Sister   . COPD Brother   . Osteoporosis Brother     SOCIAL HISTORY: History   Social History  . Marital Status: Divorced    Spouse Name: N/A    Number of Children: 2  . Years of Education: 2-College   Occupational History  .  [employer]   Social History Main Topics  . Smoking status: Current Every Day Smoker -- 0.50 packs/day for 17 years    Types: Cigarettes  . Smokeless tobacco: Never Used  . Alcohol Use: Yes     Comment: Drinks alcohol on occasion  . Drug Use: No  . Sexual Activity: Not on file   Other Topics Concern  . Not on file   Social History Narrative   Patient lives at home with her daughter.    Patient has 2 children.    Patient is Married.    Patient is right handed.    Patient has her Associates degree.           PHYSICAL EXAM  Filed Vitals:   03/01/13 0830  BP: 116/73  Pulse: 95  Height: 5\' 6"  (1.676 m)  Weight: 176 lb (79.833 kg)   Body mass index is 28.42 kg/(m^2).  Generalized: Well developed, in no acute distress  Head: normocephalic and atraumatic,. Bostic 3-4  Neck: Supple, no carotid bruits  Cardiac: Regular rate rhythm, no murmur  Musculoskeletal: No deformity   Neurological examination   Mentation: Alert oriented to time, place, history taking. Follows all commands speech and language fluent. ESS 3  Cranial nerve II-XII: Pupils were equal round reactive to light extraocular movements were full, visual field were full on confrontational test. Facial sensation and strength were normal. hearing was intact  to finger rubbing bilaterally. Uvula tongue midline. head turning and shoulder shrug were normal and symmetric.Tongue protrusion into cheek strength was normal. Motor: normal bulk and tone, full strength in the BUE, BLE, fine finger movements normal, no pronator drift. No focal weakness Sensory: normal and symmetric to light touch, pinprick, and  vibration  Coordination: finger-nose-finger, heel-to-shin bilaterally, no dysmetria Reflexes: Brachioradialis 2/2, biceps 2/2, triceps 2/2, patellar 2/2, Achilles 2/2, plantar responses were flexor bilaterally. Gait and Station: Rising up from seated position without assistance, normal stance,  moderate stride, good arm swing, smooth turning, able to perform tiptoe, and heel walking without difficulty. Tandem gait is steady  DIAGNOSTIC DATA (LABS, IMAGING, TESTING) -  ASSESSMENT AND PLAN  43 y.o. year old female  has a past medical history of Migraine; Muscle tension headache; Anxiety and depression; Insomnia; Arthritis of facet joints at multiple vertebral levels; here to followup. Her migraine triggers are predominantly stress. In addition she started snoring and waking up with headaches. She stopped her verapamil as she did not fill it is doing any good. She remains on nortriptyline at bedtime and Imitrex which she claims is no longer working for her she wants to go back to Maxalt melt.She is a shift Insurance underwriter.  Topamax 25 mg at night for 1 week then increase by 25mg  weekly until dose is 100mg  Try Maxalt MLT acutely Continue Nortriptyline at current dose Continue Percocet prn  Given a list and reviewed food triggers for migraines, she will eliminate one at a time Set up for sleep study Dennie Bible, [facility], [facility], [facility] [address] [place], [place] [address] [phone]

## 2013-03-05 ENCOUNTER — Telehealth: Payer: Self-pay | Admitting: Neurology

## 2013-03-05 DIAGNOSIS — R51 Headache: Secondary | ICD-10-CM

## 2013-03-05 DIAGNOSIS — R0683 Snoring: Secondary | ICD-10-CM

## 2013-03-05 DIAGNOSIS — R519 Headache, unspecified: Secondary | ICD-10-CM

## 2013-03-05 DIAGNOSIS — E669 Obesity, unspecified: Secondary | ICD-10-CM

## 2013-03-05 NOTE — Telephone Encounter (Signed)
This patient is referred for sleep study by nurse practitioner Cecille Rubin. The patient has a history of morning headaches, obesity, and snoring. After reviewing the sleep study referral, I entered a split night sleep study request on this patient, thanks.  Star Age, MD, PhD [facility])

## 2013-03-05 NOTE — Telephone Encounter (Signed)
Michelle Holt, refers patient for attended sleep study.  Height: 5'6  Weight: 176 lbs.  BMI: 28.42  Past Medical History:   Migraine  Muscle tension headache  Anxiety and depression  Dyslipidemia  Irritable bowel syndrome  Insomnia  Arthritis of facet joints at multiple vertebral levels  L5-S1  Plantar fasciitis of right foot    Sleep Symptoms:  Snoring with morning headaches.She is a shift Insurance underwriter.   Epworth Score: 3  Medications:  Diazepam (Tab) VALIUM 5 MG TAKE 1 TABLET BY MOUTH AT BEDTIME AS NEEDED FOR ANXIETY Nortriptyline HCl (Cap) PAMELOR 75 MG Take 1 capsule (75 mg total) by mouth at bedtime. Oxycodone-Acetaminophen (Tab) PERCOCET 7.5-325 MG Take 1 tablet by mouth every 6 (six) hours as needed for pain (Must last 28 days). Promethazine HCl (Tab) PHENERGAN 25 MG Take 25 mg by mouth every 6 (six) hours as needed for nausea. Rizatriptan Benzoate (Tablet Dispersible) MAXALT-MLT 10 MG Take 1 tablet (10 mg total) by mouth as needed for migraine. May repeat in 2 hours if needed SUMAtriptan Succinate (Tab) IMITREX 100 MG TAKE 1 TABLET TWICE A DAY IF NEEDED Topiramate (Tab) TOPAMAX 25 MG 1 po at bedtime for 1 week then increase by 25 mg weekly until dose is 100mg  TraMADol HCl (Tab) ULTRAM 50 MG Take 1 tablet (50 mg total) by mouth every 6 (six) hours as needed (MUST LAST 28 DAYS).    Insurance:  Proofreader and Plan: 43 y.o. year old female has a past medical history of Migraine; Muscle tension headache; Anxiety and depression; Insomnia; Arthritis of facet joints at multiple vertebral levels; here to followup. Her migraine triggers are predominantly stress. In addition she started snoring and waking up with headaches. She stopped her verapamil as she did not fill it is doing any good. She remains on nortriptyline at bedtime and Imitrex which she claims is no longer working for her she wants to go back to Maxalt melt.She is a shift Insurance underwriter.  Topamax 25 mg at night for 1  week then increase by 25mg  weekly until dose is 100mg   Try Maxalt MLT acutely  Continue Nortriptyline at current dose  Continue Percocet prn  Given a list and reviewed food triggers for migraines, she will eliminate one at a time  Set up for sleep study   Please review patient information and submit instructions for scheduling and orders for sleep technologist.  Thank you!

## 2013-03-16 ENCOUNTER — Other Ambulatory Visit: Payer: Self-pay | Admitting: Neurology

## 2013-03-31 ENCOUNTER — Other Ambulatory Visit: Payer: Self-pay | Admitting: Neurology

## 2013-03-31 NOTE — Telephone Encounter (Signed)
Rx signed and faxed.

## 2013-04-15 ENCOUNTER — Other Ambulatory Visit: Payer: Self-pay | Admitting: Oncology

## 2013-04-22 ENCOUNTER — Other Ambulatory Visit: Payer: Self-pay

## 2013-04-22 MED ORDER — TOPIRAMATE 25 MG PO TABS: ORAL_TABLET | ORAL | Status: DC

## 2013-04-22 NOTE — Telephone Encounter (Signed)
Pharmacy requests 90 day Rx  

## 2013-05-26 ENCOUNTER — Encounter: Payer: Self-pay | Admitting: Nurse Practitioner

## 2013-06-09 ENCOUNTER — Telehealth: Payer: Self-pay | Admitting: Nurse Practitioner

## 2013-06-09 ENCOUNTER — Other Ambulatory Visit: Payer: Self-pay | Admitting: Neurology

## 2013-06-09 MED ORDER — NORTRIPTYLINE HCL 50 MG PO CAPS: 50.0000 mg | ORAL_CAPSULE | Freq: Every day | ORAL | Status: DC

## 2013-06-09 MED ORDER — OXYCODONE-ACETAMINOPHEN 7.5-325 MG PO TABS: 1.0000 | ORAL_TABLET | Freq: Four times a day (QID) | ORAL | Status: DC | PRN

## 2013-06-09 NOTE — Telephone Encounter (Signed)
I called patient. The patient is having daily headaches over the last month. She has been placed on Topamax which has not helped. Nortriptyline in the past has helped. We will go up on the nortriptyline taking 100 mg at night. The patient will taper off of the Topamax about 25 mg every 2 weeks until she stopped the medication.  The patient has remarried, her name now is Michelle Holt. Her new address is [address] [place], [place] [address].

## 2013-06-09 NOTE — Telephone Encounter (Signed)
Patient called and stated she has had a headache everyday for an entire month.  Could she increase her medication nortriptyline (PAMELOR) 75 MG capsule.  Please call and advise

## 2013-06-09 NOTE — Telephone Encounter (Signed)
Called pt to inform her that her Rx was ready to be picked up at the front desk and if she has any other problems, questions or concerns to call the office. Pt verbalized understanding. °

## 2013-06-09 NOTE — Telephone Encounter (Signed)
Patient indicates she has had a headache every day for one month.  She would like to know if the Nortriptyline dose could be increased.  Please advise.  Thank you.

## 2013-06-11 ENCOUNTER — Telehealth: Payer: Self-pay | Admitting: Neurology

## 2013-06-11 MED ORDER — NORTRIPTYLINE HCL 50 MG PO CAPS: 100.0000 mg | ORAL_CAPSULE | Freq: Every day | ORAL | Status: DC

## 2013-06-11 NOTE — Telephone Encounter (Signed)
Returned patient's call and explained Pamelor was written for 50 mg. Dr. Jannifer Franklin told patient per discharge instructions 100 mg to be written. I explained to patient it was written for 50 mg and she could take 2. She has 5 additional refills. Patient verbalizes understanding.

## 2013-06-11 NOTE — Telephone Encounter (Signed)
Patient called to let Dr. Jannifer Franklin know that the increased dosage of nortriptyline was supposed to be 100 mg but when she picked it up at the pharmacy it was written for 50 mg. She wanted to make someone aware and make the correction. Please call to advise.

## 2013-06-11 NOTE — Telephone Encounter (Signed)
Per last phone note:  Kathrynn Ducking, MD at 06/09/2013 2:48 PM     Status: Signed        I called patient. The patient is having daily headaches over the last month. She has been placed on Topamax which has not helped. Nortriptyline in the past has helped. We will go up on the nortriptyline taking 100 mg at night. The patient will taper off of the Topamax about 25 mg every 2 weeks until she stopped the medication.   Rx has been updated and resent.  I called the pharmacy, spoke with Merrilee Seashore.  He is aware of the current dose and said they will save the Rx on file until patient needs it (since she just picked up the Rx).  I called the patient back as well, advised her when a refill is needed, the 100mg  nightly dose is saved on her file at the pharmacy.  She verbalized understanding.

## 2013-06-27 ENCOUNTER — Other Ambulatory Visit: Payer: Self-pay | Admitting: Neurology

## 2013-06-28 NOTE — Telephone Encounter (Signed)
Rx signed and faxed.

## 2013-06-29 ENCOUNTER — Encounter (INDEPENDENT_AMBULATORY_CARE_PROVIDER_SITE_OTHER): Payer: Self-pay | Admitting: *Deleted

## 2013-07-01 ENCOUNTER — Telehealth: Payer: Self-pay | Admitting: Neurology

## 2013-07-01 ENCOUNTER — Ambulatory Visit: Payer: BC Managed Care – PPO | Admitting: Nurse Practitioner

## 2013-07-01 NOTE — Telephone Encounter (Signed)
Patient requesting an note from Dr. Jannifer Franklin, stating that she has contacted our office and migraine medication has been changed to nortriptyline (PAMELOR) 50 MG capsule for FMLA.  Patient requesting note be faxed to [facility] @ [phone]

## 2013-07-02 ENCOUNTER — Encounter: Payer: Self-pay | Admitting: Neurology

## 2013-07-02 NOTE — Telephone Encounter (Signed)
Patient requesting a note from Dr. Jannifer Franklin, stating that she has contacted our office and migraine medication has been changed to nortriptyline (PAMELOR) 50 MG capsule for FMLA. Patient requesting note be faxed to [facility] @ [phone]

## 2013-07-02 NOTE — Telephone Encounter (Signed)
I will write a note. Not exactly sure why she needs this.

## 2013-07-15 ENCOUNTER — Telehealth: Payer: Self-pay | Admitting: Neurology

## 2013-07-15 NOTE — Telephone Encounter (Signed)
Patient needs an new letter reflecting that she was affected from 6/8-6/19 due to the medication change and that she was having more headaches to [facility] Right- fax [phone].

## 2013-07-19 NOTE — Telephone Encounter (Signed)
I called the patient. I already dictated one letter to [facility]. I need to know what information she needs. I will call her tomorrow.

## 2013-07-19 NOTE — Telephone Encounter (Signed)
Last office visit 2/16 with C.Martin. She is requesting a letter be written and faxed to [facility]. She reports being affected with migraine and medication change from 6/8-6/19. See 5/29 phone note.

## 2013-07-20 ENCOUNTER — Encounter: Payer: Self-pay | Admitting: Neurology

## 2013-07-20 NOTE — Telephone Encounter (Signed)
I called the patient again, left a message again, will call back later. 

## 2013-07-20 NOTE — Telephone Encounter (Signed)
The patient did call back, I have generated a letter for her.

## 2013-08-02 ENCOUNTER — Ambulatory Visit (INDEPENDENT_AMBULATORY_CARE_PROVIDER_SITE_OTHER): Payer: BC Managed Care – PPO | Admitting: Internal Medicine

## 2013-08-05 DIAGNOSIS — Z0289 Encounter for other administrative examinations: Secondary | ICD-10-CM

## 2013-08-10 ENCOUNTER — Encounter (INDEPENDENT_AMBULATORY_CARE_PROVIDER_SITE_OTHER): Payer: Self-pay | Admitting: *Deleted

## 2013-08-10 ENCOUNTER — Other Ambulatory Visit (INDEPENDENT_AMBULATORY_CARE_PROVIDER_SITE_OTHER): Payer: Self-pay | Admitting: *Deleted

## 2013-08-10 ENCOUNTER — Ambulatory Visit (INDEPENDENT_AMBULATORY_CARE_PROVIDER_SITE_OTHER): Payer: BC Managed Care – PPO | Admitting: Internal Medicine

## 2013-08-10 ENCOUNTER — Encounter (INDEPENDENT_AMBULATORY_CARE_PROVIDER_SITE_OTHER): Payer: Self-pay | Admitting: Internal Medicine

## 2013-08-10 VITALS — BP 106/64 | HR 76 | Temp 98.0°F | Ht 66.0 in | Wt 170.4 lb

## 2013-08-10 DIAGNOSIS — K625 Hemorrhage of anus and rectum: Secondary | ICD-10-CM | POA: Insufficient documentation

## 2013-08-10 DIAGNOSIS — K5900 Constipation, unspecified: Secondary | ICD-10-CM

## 2013-08-10 NOTE — Telephone Encounter (Signed)
This encounter was created in error - please disregard.

## 2013-08-10 NOTE — Progress Notes (Signed)
Subjective:     Patient ID: Michelle Holt, female   DOB: [birth year], 43 y.o.   MRN: [id number]  HPI Referred to our office by Dr. Woody Seller for abdominal pain/colonoscopy. She has had symptoms for 5 months. She has been under a lot of stress with her husband who has lung cancer. She lost her mother last year from end stage renal disease. Mother broke her ankle and never recuperated.  She has a BM every 7-10 days. Normally she had a BM every 5 days.  She says she sometimes sees blood when she wipes and in the stool.  She drinks Laso tea for her constipation.  She also says she see white "slime" in her stool. She tells me sometimes her stools are hard and sometimes they are soft. Sometimes she will abdominal pain with her BMs.  Her last BM was this am and she did see some blood.  06/21/2013 H and H 14.0 and 42.2, MCV 102  Appetite is good. She has gained weight since she quit smoking. No abdominal distention. Acid reflux is worse since gaining weight. She does state that it is controlled with the Nexium.  No family hx of colon cancer.   Review of Systems Past Medical History  Diagnosis Date  . Migraine   . Muscle tension headache   . Anxiety and depression   . Dyslipidemia   . Irritable bowel syndrome   . Insomnia   . Arthritis of facet joints at multiple vertebral levels     L5-S1  . Plantar fasciitis of right foot     Past Surgical History  Procedure Laterality Date  . Uterine ablation    . Tubal ligation Bilateral   . Cholecystectomy  2008    Allergies  Allergen Reactions  . Actifed Cold-Allergy [Chlorpheniramine-Phenyleph Er] Rash  . Amoxicillin Rash  . Codeine Hives  . Erythromycin Rash  . Penicillins Rash  . Sudafed [Pseudoephedrine Hcl] Rash  . Zithromax [Azithromycin] Rash    Current Outpatient Prescriptions on File Prior to Visit  Medication Sig Dispense Refill  . diazepam (VALIUM) 5 MG tablet TAKE 1 TABLET BY MOUTH AT BEDTIME AS NEEDED FOR ANXIETY  30 tablet  5  .  nortriptyline (PAMELOR) 50 MG capsule Take 2 capsules (100 mg total) by mouth at bedtime.  60 capsule  5  . oxyCODONE-acetaminophen (PERCOCET) 7.5-325 MG per tablet Take 1 tablet by mouth every 6 (six) hours as needed for pain (Must last 28 days).  40 tablet  0  . promethazine (PHENERGAN) 25 MG tablet TAKE 1 TABLET EVERY 6 HOURS IF NEEDED  30 tablet  6  . rizatriptan (MAXALT-MLT) 10 MG disintegrating tablet Take 1 tablet (10 mg total) by mouth as needed for migraine. May repeat in 2 hours if needed  10 tablet  4  . SUMAtriptan (IMITREX) 100 MG tablet TAKE 1 TABLET TWICE A DAY IF NEEDED  9 tablet  5  . traMADol (ULTRAM) 50 MG tablet TAKE 1 TABLET BY MOUTH EVERY 6 HOURS AS NEEDED.MUST LAST 28 DAYS  50 tablet  5   No current facility-administered medications on file prior to visit.        Objective:   Physical Exam  Filed Vitals:   08/10/13 1624  BP: 106/64  Pulse: 76  Temp: 98 F (36.7 C)  Height: 5\' 6"  (1.676 m)  Weight: 170 lb 6.4 oz (77.293 kg)  Alert and oriented. Skin warm and dry. Oral mucosa is moist.   . Sclera anicteric, conjunctivae  is pink. Thyroid not enlarged. No cervical lymphadenopathy. Lungs clear. Heart regular rate and rhythm.  Abdomen is soft. Bowel sounds are positive. No hepatomegaly. No abdominal masses felt. No tenderness.  No edema to lower extremities. Stool brown and guaiac negative.       Assessment:    Rectal bleeding. Colonic neoplasm needs to be ruled out.  Rectal bleeding possibly from her constipation.    Constipation Plan:     Samples of Linzess 118mcg x 3 given to patient. . Needs colonoscopy.

## 2013-08-10 NOTE — Patient Instructions (Signed)
Colonoscopy.  The risks and benefits such as perforation, bleeding, and infection were reviewed with the patient and is agreeable. 

## 2013-08-13 ENCOUNTER — Encounter (HOSPITAL_COMMUNITY)
Admission: RE | Disposition: A | Discharge status: Home / Self Care | Payer: Self-pay | Source: Ambulatory Visit | Attending: Internal Medicine

## 2013-08-13 ENCOUNTER — Ambulatory Visit (HOSPITAL_COMMUNITY)
Admission: RE | Admit: 2013-08-13 | Discharge: 2013-08-13 | Disposition: A | Discharge status: Home / Self Care | Payer: BC Managed Care – PPO | Source: Ambulatory Visit | Attending: Internal Medicine | Admitting: Internal Medicine

## 2013-08-13 ENCOUNTER — Encounter (HOSPITAL_COMMUNITY): Payer: Self-pay | Admitting: *Deleted

## 2013-08-13 DIAGNOSIS — K5900 Constipation, unspecified: Secondary | ICD-10-CM

## 2013-08-13 DIAGNOSIS — Z87891 Personal history of nicotine dependence: Secondary | ICD-10-CM | POA: Insufficient documentation

## 2013-08-13 DIAGNOSIS — K625 Hemorrhage of anus and rectum: Secondary | ICD-10-CM

## 2013-08-13 DIAGNOSIS — K649 Unspecified hemorrhoids: Secondary | ICD-10-CM | POA: Insufficient documentation

## 2013-08-13 DIAGNOSIS — F411 Generalized anxiety disorder: Secondary | ICD-10-CM | POA: Insufficient documentation

## 2013-08-13 DIAGNOSIS — R109 Unspecified abdominal pain: Secondary | ICD-10-CM | POA: Insufficient documentation

## 2013-08-13 DIAGNOSIS — F329 Major depressive disorder, single episode, unspecified: Secondary | ICD-10-CM | POA: Insufficient documentation

## 2013-08-13 DIAGNOSIS — K921 Melena: Secondary | ICD-10-CM

## 2013-08-13 DIAGNOSIS — K6389 Other specified diseases of intestine: Secondary | ICD-10-CM

## 2013-08-13 DIAGNOSIS — F3289 Other specified depressive episodes: Secondary | ICD-10-CM | POA: Insufficient documentation

## 2013-08-13 DIAGNOSIS — K644 Residual hemorrhoidal skin tags: Secondary | ICD-10-CM

## 2013-08-13 DIAGNOSIS — Z79899 Other long term (current) drug therapy: Secondary | ICD-10-CM | POA: Insufficient documentation

## 2013-08-13 HISTORY — PX: COLONOSCOPY: SHX5424

## 2013-08-13 HISTORY — DX: Constipation, unspecified: K59.00

## 2013-08-13 SURGERY — COLONOSCOPY
Anesthesia: Moderate Sedation

## 2013-08-13 MED ORDER — PSYLLIUM 28 % PO PACK: 1.0000 | PACK | Freq: Every day | ORAL | Status: DC

## 2013-08-13 MED ORDER — STERILE WATER FOR IRRIGATION IR SOLN
Status: DC | PRN
Start: 2013-08-13 — End: 2013-08-13
  Administered 2013-08-13: 14:00:00

## 2013-08-13 MED ORDER — MIDAZOLAM HCL 5 MG/5ML IJ SOLN
INTRAMUSCULAR | Status: AC
  Filled 2013-08-13: qty 5

## 2013-08-13 MED ORDER — MIDAZOLAM HCL 5 MG/5ML IJ SOLN
INTRAMUSCULAR | Status: DC | PRN
  Administered 2013-08-13 (×2): 2 mg via INTRAVENOUS
  Administered 2013-08-13: 1 mg via INTRAVENOUS
  Administered 2013-08-13: 2 mg via INTRAVENOUS
  Administered 2013-08-13: 3 mg via INTRAVENOUS
  Administered 2013-08-13: 2 mg via INTRAVENOUS

## 2013-08-13 MED ORDER — MEPERIDINE HCL 50 MG/ML IJ SOLN
INTRAMUSCULAR | Status: AC
  Filled 2013-08-13: qty 1

## 2013-08-13 MED ORDER — SODIUM CHLORIDE 0.9 % IV SOLN
INTRAVENOUS | Status: DC
  Administered 2013-08-13: 13:00:00 via INTRAVENOUS

## 2013-08-13 MED ORDER — DOCUSATE SODIUM 100 MG PO CAPS: 200.0000 mg | ORAL_CAPSULE | Freq: Every day | ORAL | Status: DC

## 2013-08-13 MED ORDER — MIDAZOLAM HCL 5 MG/5ML IJ SOLN
INTRAMUSCULAR | Status: AC
  Filled 2013-08-13: qty 10

## 2013-08-13 MED ORDER — MEPERIDINE HCL 50 MG/ML IJ SOLN
INTRAMUSCULAR | Status: DC | PRN
  Administered 2013-08-13 (×2): 25 mg via INTRAVENOUS

## 2013-08-13 NOTE — Discharge Instructions (Signed)
Resume usual medications and high fiber diet. Metamucil 4 g or one packet by mouth daily at bedtime. Colace 200 mg by mouth daily at bedtime. No driving for 24 hours.  Colonoscopy, Care After These instructions give you information on caring for yourself after your procedure. Your doctor may also give you more specific instructions. Call your doctor if you have any problems or questions after your procedure. HOME CARE  Do not drive for 24 hours.  Do not sign important papers or use machinery for 24 hours.  You may shower.  You may go back to your usual activities, but go slower for the first 24 hours.  Take rest breaks often during the first 24 hours.  Walk around or use warm packs on your belly (abdomen) if you have belly cramping or gas.  Drink enough fluids to keep your pee (urine) clear or pale yellow.  Resume your normal diet. Avoid heavy or fried foods.  Avoid drinking alcohol for 24 hours or as told by your doctor.  Only take medicines as told by your doctor. If a tissue sample (biopsy) was taken during the procedure:   Do not take aspirin or blood thinners for 7 days, or as told by your doctor.  Do not drink alcohol for 7 days, or as told by your doctor.  Eat soft foods for the first 24 hours. GET HELP IF: You still have a small amount of blood in your poop (stool) 2-3 days after the procedure. GET HELP RIGHT AWAY IF:  You have more than a small amount of blood in your poop.  You see clumps of tissue (blood clots) in your poop.  Your belly is puffy (swollen).  You feel sick to your stomach (nauseous) or throw up (vomit).  You have a fever.  You have belly pain that gets worse and medicine does not help. MAKE SURE YOU:  Understand these instructions.  Will watch your condition.  Will get help right away if you are not doing well or get worse. Document Released: 02/02/2010 Document Revised: 01/05/2013 Document Reviewed: 09/07/2012 [facility] Patient  Information 2015 [place], [place]. This information is not intended to replace advice given to you by your health care provider. Make sure you discuss any questions you have with your health care provider. High-Fiber Diet Fiber is found in fruits, vegetables, and grains. A high-fiber diet encourages the addition of more whole grains, legumes, fruits, and vegetables in your diet. The recommended amount of fiber for adult males is 38 g per day. For adult females, it is 25 g per day. Pregnant and lactating women should get 28 g of fiber per day. If you have a digestive or bowel problem, ask your caregiver for advice before adding high-fiber foods to your diet. Eat a variety of high-fiber foods instead of only a select few type of foods.  PURPOSE  To increase stool bulk.  To make bowel movements more regular to prevent constipation.  To lower cholesterol.  To prevent overeating. WHEN IS THIS DIET USED?  It may be used if you have constipation and hemorrhoids.  It may be used if you have uncomplicated diverticulosis (intestine condition) and irritable bowel syndrome.  It may be used if you need help with weight management.  It may be used if you want to add it to your diet as a protective measure against atherosclerosis, diabetes, and cancer. SOURCES OF FIBER  Whole-grain breads and cereals.  Fruits, such as apples, oranges, bananas, berries, prunes, and pears.  Vegetables, such as green peas, carrots, sweet potatoes, beets, broccoli, cabbage, spinach, and artichokes.  Legumes, such split peas, soy, lentils.  Almonds. FIBER CONTENT IN FOODS Starches and Grains / Dietary Fiber (g)  Cheerios, 1 cup / 3 g  Corn Flakes cereal, 1 cup / 0.7 g  Rice crispy treat cereal, 1 cup / 0.3 g  Instant oatmeal (cooked),  cup / 2 g  Frosted wheat cereal, 1 cup / 5.1 g  Brown, long-grain rice (cooked), 1 cup / 3.5 g  White, long-grain rice (cooked), 1 cup / 0.6 g  Enriched macaroni (cooked),  1 cup / 2.5 g Legumes / Dietary Fiber (g)  Baked beans (canned, plain, or vegetarian),  cup / 5.2 g  Kidney beans (canned),  cup / 6.8 g  Pinto beans (cooked),  cup / 5.5 g Breads and Crackers / Dietary Fiber (g)  Plain or honey graham crackers, 2 squares / 0.7 g  Saltine crackers, 3 squares / 0.3 g  Plain, salted pretzels, 10 pieces / 1.8 g  Whole-wheat bread, 1 slice / 1.9 g  White bread, 1 slice / 0.7 g  Raisin bread, 1 slice / 1.2 g  Plain bagel, 3 oz / 2 g  Flour tortilla, 1 oz / 0.9 g  Corn tortilla, 1 small / 1.5 g  Hamburger or hotdog bun, 1 small / 0.9 g Fruits / Dietary Fiber (g)  Apple with skin, 1 medium / 4.4 g  Sweetened applesauce,  cup / 1.5 g  Banana,  medium / 1.5 g  Grapes, 10 grapes / 0.4 g  Orange, 1 small / 2.3 g  Raisin, 1.5 oz / 1.6 g  Melon, 1 cup / 1.4 g Vegetables / Dietary Fiber (g)  Green beans (canned),  cup / 1.3 g  Carrots (cooked),  cup / 2.3 g  Broccoli (cooked),  cup / 2.8 g  Peas (cooked),  cup / 4.4 g  Mashed potatoes,  cup / 1.6 g  Lettuce, 1 cup / 0.5 g  Corn (canned),  cup / 1.6 g  Tomato,  cup / 1.1 g Document Released: 12/31/2004 Document Revised: 07/02/2011 Document Reviewed: 04/04/2011 [employer] Patient Information 2015 [place], [place]. This information is not intended to replace advice given to you by your health care provider. Make sure you discuss any questions you have with your health care provider.

## 2013-08-13 NOTE — Op Note (Signed)
COLONOSCOPY PROCEDURE REPORT  PATIENT:  Michelle Holt  MR#:  [id number] Birthdate:  [birth year], 44 y.o., female Endoscopist:  Dr. Rogene Houston, MD Referred By:  Dr. Dr, [name], MD Procedure Date: 08/13/2013  Procedure:   Colonoscopy  Indications:  Patient is a 43 year old Caucasian female who presents with worsening constipation and hematochezia. Patient states of people have been diagnosed with colon carcinoma at her place of employment.  Informed Consent:  The procedure and risks were reviewed with the patient and informed consent was obtained.  Medications:  Demerol 50 mg IV Versed 12 mg IV  Description of procedure:  After a digital rectal exam was performed, that colonoscope was advanced from the anus through the rectum and colon to the area of the cecum, ileocecal valve and appendiceal orifice. The cecum was deeply intubated. These structures were well-seen and photographed for the record. From the level of the cecum and ileocecal valve, the scope was slowly and cautiously withdrawn. The mucosal surfaces were carefully surveyed utilizing scope tip to flexion to facilitate fold flattening as needed. The scope was pulled down into the rectum where a thorough exam including retroflexion was performed.  Findings:   Prep satisfactory. Normal mucosa of cecum, ascending colon, hepatic flexure, transverse colon, splenic flexure, descending and sigmoid colon. Normal rectal mucosa. Small anal papillae and hemorrhoids below the dentate line.   Therapeutic/Diagnostic Maneuvers Performed:   None  Complications:  None  Cecal Withdrawal Time:  9 minutes  Impression:  Normal colonoscopy except external hemorrhoids and small anal papillae.  Recommendations:  Standard instructions given. High fiber diet. Metamucil 4 g or one packet by mouth daily at bedtime. Colace 20 mg by mouth each bedtime.   Saba Neuman U  08/13/2013 2:07 PM  CC: Dr. [name]., MD & Dr. Rayne Du ref.  provider found

## 2013-08-13 NOTE — H&P (Signed)
Michelle Holt is an 43 y.o. female.   Chief Complaint: Patient is here for colonoscopy. HPI: Patient is a 43 year old Caucasian female with history of intermittent right-sided abdominal pain felt to be due to the bowel syndrome. She also has history of constipation. For the last few months she has noted she's going to the bathroom every 7-10 days she is to once every 5 days. She also notices blood with her bowel movements. She has noted small to moderate amount of bright red blood per rectum. She denies anorexia or weight loss. She states trial he been diagnosed with cancer where she works. 5 of these or colon cancers.  Past Medical History  Diagnosis Date  . Migraine   . Muscle tension headache   . Anxiety and depression   . Dyslipidemia   . Irritable bowel syndrome   . Insomnia   . Arthritis of facet joints at multiple vertebral levels     L5-S1  . Plantar fasciitis of right foot   . Constipation     Past Surgical History  Procedure Laterality Date  . Uterine ablation    . Tubal ligation Bilateral   . Cholecystectomy  2008  . Esophagogastroduodenoscopy      Family History  Problem Relation Age of Onset  . Hypertension Mother   . Obesity Mother   . Diabetes Mother   . Peripheral vascular disease Father   . COPD Brother   . Osteoporosis Brother   . Colon cancer Neg Hx    Social History:  reports that she has quit smoking. Her smoking use included Cigarettes. She has a 8.5 pack-year smoking history. She has never used smokeless tobacco. She reports that she drinks alcohol. She reports that she does not use illicit drugs.  Allergies:  Allergies  Allergen Reactions  . Actifed Cold-Allergy [Chlorpheniramine-Phenyleph Er] Rash  . Amoxicillin Rash  . Codeine Hives  . Erythromycin Rash  . Penicillins Rash  . Sudafed [Pseudoephedrine Hcl] Rash  . Zithromax [Azithromycin] Rash    Medications Prior to Admission  Medication Sig Dispense Refill  . Ascorbic Acid (VITAMIN C)  100 MG tablet Take by mouth daily.      Marland Kitchen esomeprazole (NEXIUM) 40 MG capsule Take 40 mg by mouth daily at 12 noon.      . nortriptyline (PAMELOR) 50 MG capsule Take 2 capsules (100 mg total) by mouth at bedtime.  60 capsule  5  . oxyCODONE-acetaminophen (PERCOCET) 7.5-325 MG per tablet Take 1 tablet by mouth every 6 (six) hours as needed for pain (Must last 28 days).  40 tablet  0  . SUMAtriptan (IMITREX) 100 MG tablet TAKE 1 TABLET TWICE A DAY IF NEEDED  9 tablet  5  . diazepam (VALIUM) 5 MG tablet TAKE 1 TABLET BY MOUTH AT BEDTIME AS NEEDED FOR ANXIETY  30 tablet  5  . promethazine (PHENERGAN) 25 MG tablet TAKE 1 TABLET EVERY 6 HOURS IF NEEDED  30 tablet  6  . rizatriptan (MAXALT-MLT) 10 MG disintegrating tablet Take 1 tablet (10 mg total) by mouth as needed for migraine. May repeat in 2 hours if needed  10 tablet  4  . traMADol (ULTRAM) 50 MG tablet TAKE 1 TABLET BY MOUTH EVERY 6 HOURS AS NEEDED.MUST LAST 28 DAYS  50 tablet  5    No results found for this or any previous visit (from the past 48 hour(s)). No results found.  ROS  Blood pressure 120/82, pulse 94, temperature 98.2 F (36.8 C), temperature source  Oral, resp. rate 18, height 5\' 6"  (1.676 m), weight 170 lb (77.111 kg), SpO2 100.00%. Physical Exam  Constitutional: She appears well-developed and well-nourished.  HENT:  Mouth/Throat: Oropharynx is clear and moist.  Eyes: Conjunctivae are normal. No scleral icterus.  Neck: No thyromegaly present.  Cardiovascular: Normal rate, regular rhythm and normal heart sounds.   No murmur heard. Respiratory: Effort normal and breath sounds normal.  GI: Soft. She exhibits no distension and no mass. There is no tenderness.  Musculoskeletal: She exhibits no edema.  Lymphadenopathy:    She has no cervical adenopathy.  Neurological: She is alert.  Skin: Skin is warm and dry.     Assessment/Plan Change in bowel habits with worsening constipation. Hematochezia. Diagnostic  colonoscopy.  REHMAN,NAJEEB U 08/13/2013, 1:30 PM

## 2013-08-16 ENCOUNTER — Telehealth: Payer: Self-pay | Admitting: Neurology

## 2013-08-16 NOTE — Telephone Encounter (Signed)
Patient calling to state that Dr. Jannifer Franklin needs to fill out questions 6 and 7 in detail on her FMLA paperwork, please return call to patient and advise.

## 2013-08-19 NOTE — Telephone Encounter (Signed)
Spoke to patient and edited FMLA to follow previous forms, and patient's history.  I have faxed to [phone] and received confirmation.

## 2013-09-06 ENCOUNTER — Other Ambulatory Visit: Payer: Self-pay | Admitting: Nurse Practitioner

## 2013-09-06 MED ORDER — OXYCODONE-ACETAMINOPHEN 7.5-325 MG PO TABS: 1.0000 | ORAL_TABLET | Freq: Four times a day (QID) | ORAL | Status: DC | PRN

## 2013-09-06 NOTE — Telephone Encounter (Signed)
Patient requesting Rx refill of oxyCODONE-acetaminophen (PERCOCET) 7.5-325 MG per tablet.  Please call when ready for pick up

## 2013-09-06 NOTE — Telephone Encounter (Signed)
Request forwarded to provider for approval  

## 2013-09-07 NOTE — Telephone Encounter (Signed)
Called pt to inform her that her Rx was ready to be picked up at the front desk and if she has any other problems, questions or concerns to call the office. Pt verbalized understanding. °

## 2013-09-13 ENCOUNTER — Ambulatory Visit: Payer: BC Managed Care – PPO | Admitting: Nurse Practitioner

## 2013-09-21 ENCOUNTER — Telehealth (INDEPENDENT_AMBULATORY_CARE_PROVIDER_SITE_OTHER): Payer: Self-pay | Admitting: *Deleted

## 2013-09-21 DIAGNOSIS — K5900 Constipation, unspecified: Secondary | ICD-10-CM

## 2013-09-21 MED ORDER — LINACLOTIDE 145 MCG PO CAPS: 145.0000 ug | ORAL_CAPSULE | Freq: Every day | ORAL | Status: DC

## 2013-09-21 NOTE — Telephone Encounter (Signed)
Dr. Laural Golden told Michelle Holt to take stool softeners to help her go to the bathroom when she had her TCS.  They are not working but started back on the Linzess samples that Terri gave her. Would like to know if it would be okay for her to take the [place] and if so, please send a Rx to her pharmacy for them. The return phone number is [phone].

## 2013-10-20 ENCOUNTER — Ambulatory Visit (INDEPENDENT_AMBULATORY_CARE_PROVIDER_SITE_OTHER): Payer: BC Managed Care – PPO | Admitting: Nurse Practitioner

## 2013-10-20 ENCOUNTER — Encounter: Payer: Self-pay | Admitting: Nurse Practitioner

## 2013-10-20 ENCOUNTER — Encounter (INDEPENDENT_AMBULATORY_CARE_PROVIDER_SITE_OTHER): Payer: Self-pay

## 2013-10-20 VITALS — BP 115/67 | HR 97 | Temp 98.1°F | Ht 66.0 in | Wt 180.0 lb

## 2013-10-20 DIAGNOSIS — M47817 Spondylosis without myelopathy or radiculopathy, lumbosacral region: Secondary | ICD-10-CM

## 2013-10-20 DIAGNOSIS — G43019 Migraine without aura, intractable, without status migrainosus: Secondary | ICD-10-CM

## 2013-10-20 MED ORDER — NORTRIPTYLINE HCL 50 MG PO CAPS: 100.0000 mg | ORAL_CAPSULE | Freq: Every day | ORAL | Status: DC

## 2013-10-20 MED ORDER — OXYCODONE-ACETAMINOPHEN 7.5-325 MG PO TABS: 1.0000 | ORAL_TABLET | Freq: Four times a day (QID) | ORAL | Status: DC | PRN

## 2013-10-20 NOTE — Progress Notes (Signed)
[facility]  PATIENT: DEEM MARMOL DOB: 1970-09-08   REASON FOR VISIT: Followup for migraines    HISTORY OF PRESENT ILLNESS:Ms Leinbach,  43 year old female returns for follow up. She has a history of migraine. She was last seen 03/01/13. She says today her headaches have been worse in the last few months. Stress is a trigger for headaches.  Her mother died then her mother-in-law is currently in hospice and her husband just diagnosed with lung cancer with mets to the brain.   She also complains of weight gain and snoring with morning headaches.She is a shift Insurance underwriter. She was asked to get a sleep study at her last visit however she has put that off. She says her husband says she quits breathing frequently at night. She is using both  Imitrex and  Maxalt melt acutely. She has stopped her Verapamil. She is not aware of any foods or other triggers except for changes in the weather. She also complains of more problems with low back pain. She has had facet joint injections in the past the last being 2 years ago. She returns for reevaluation   HISTORY:of migraine headache, muscle tension headache, and lumbosacral spondylosis. The patient has significant L5-S1 level facet joint arthritis that has responded in the past to injections. The patient recently was diagnosed with right-sided plantar fasciitis, and a heel spur. The patient received an injection for this, and she is doing somewhat better. The patient was placed on diclofenac for the plantar fasciitis. The patient has recently been under a lot of stress with an illness involving her mother. The patient had worsening of headaches, and the nortriptyline dose was increased to 70 mg at night. The patient has had an improvement in the daily headaches, but she continues to have occasional migraine. The patient takes Imitrex for migraine, with some benefit. The patient returns to this office for an evaluation.      REVIEW OF SYSTEMS:  Full 14 system review of systems performed and notable only for those listed, all others are neg:  Constitutional: N/A  Cardiovascular: N/A  Ear/Nose/Throat: N/A  Skin: N/A  Eyes: N/A  Respiratory: N/A  Gastroitestinal: N/A  Hematology/Lymphatic: N/A  Endocrine: N/A Musculoskeletal:N/A  Allergy/Immunology: N/A  Neurological: Headache  Psychiatric: Depression anxiety Sleep : Snoring, daytime sleepiness   ALLERGIES: Allergies  Allergen Reactions  . Actifed Cold-Allergy [Chlorpheniramine-Phenyleph Er] Rash  . Amoxicillin Rash  . Codeine Hives  . Erythromycin Rash  . Penicillins Rash  . Sudafed [Pseudoephedrine Hcl] Rash  . Zithromax [Azithromycin] Rash    HOME MEDICATIONS: Outpatient Prescriptions Prior to Visit  Medication Sig Dispense Refill  . Ascorbic Acid (VITAMIN C) 100 MG tablet Take by mouth daily.      . diazepam (VALIUM) 5 MG tablet TAKE 1 TABLET BY MOUTH AT BEDTIME AS NEEDED FOR ANXIETY  30 tablet  5  . esomeprazole (NEXIUM) 40 MG capsule Take 40 mg by mouth daily at 12 noon.      . Linaclotide (LINZESS) 145 MCG CAPS capsule Take 1 capsule (145 mcg total) by mouth daily.  30 capsule  2  . nortriptyline (PAMELOR) 50 MG capsule Take 2 capsules (100 mg total) by mouth at bedtime.  60 capsule  5  . oxyCODONE-acetaminophen (PERCOCET) 7.5-325 MG per tablet Take 1 tablet by mouth every 6 (six) hours as needed for pain (Must last 28 days).  40 tablet  0  . promethazine (PHENERGAN) 25 MG tablet TAKE 1 TABLET EVERY 6  HOURS IF NEEDED  30 tablet  6  . rizatriptan (MAXALT-MLT) 10 MG disintegrating tablet Take 1 tablet (10 mg total) by mouth as needed for migraine. May repeat in 2 hours if needed  10 tablet  4  . SUMAtriptan (IMITREX) 100 MG tablet TAKE 1 TABLET TWICE A DAY IF NEEDED  9 tablet  5  . traMADol (ULTRAM) 50 MG tablet TAKE 1 TABLET BY MOUTH EVERY 6 HOURS AS NEEDED.MUST LAST 28 DAYS  50 tablet  5  . docusate sodium (COLACE) 100 MG capsule Take 2 capsules (200 mg total)  by mouth daily.  10 capsule  0  . psyllium (METAMUCIL SMOOTH TEXTURE) 28 % packet Take 1 packet by mouth at bedtime.       No facility-administered medications prior to visit.    PAST MEDICAL HISTORY: Past Medical History  Diagnosis Date  . Migraine   . Muscle tension headache   . Anxiety and depression   . Dyslipidemia   . Irritable bowel syndrome   . Insomnia   . Arthritis of facet joints at multiple vertebral levels     L5-S1  . Plantar fasciitis of right foot   . Constipation     PAST SURGICAL HISTORY: Past Surgical History  Procedure Laterality Date  . Uterine ablation    . Tubal ligation Bilateral   . Cholecystectomy  2008  . Esophagogastroduodenoscopy    . Colonoscopy N/A 08/13/2013    Procedure: COLONOSCOPY;  Surgeon: Rogene Houston, MD;  Location: [facility];  Service: Endoscopy;  Laterality: N/A;  230-moved to 145 Ann to notify pt    FAMILY HISTORY: Family History  Problem Relation Age of Onset  . Hypertension Mother   . Obesity Mother   . Diabetes Mother   . Peripheral vascular disease Father   . COPD Brother   . Osteoporosis Brother   . Colon cancer Neg Hx     SOCIAL HISTORY: History   Social History  . Marital Status: Married    Spouse Name: N/A    Number of Children: 2  . Years of Education: 2-College   Occupational History  .  [employer]   Social History Main Topics  . Smoking status: Current Every Day Smoker -- 0.50 packs/day for 17 years    Types: Cigarettes  . Smokeless tobacco: Never Used     Comment: resume smoking 3-4 daily  . Alcohol Use: Yes     Comment: Drinks alcohol on occasion  . Drug Use: No  . Sexual Activity: Not on file   Other Topics Concern  . Not on file   Social History Narrative   Patient lives at home with her daughter.    Patient has 2 children.    Patient is Married.    Patient is right handed.    Patient has her Associates degree.           PHYSICAL EXAM  Filed Vitals:   10/20/13 1523    BP: 115/67  Pulse: 97  Temp: 98.1 F (36.7 C)  TempSrc: Oral  Height: 5\' 6"  (1.676 m)  Weight: 180 lb (81.647 kg)   Body mass index is 29.07 kg/(m^2). Generalized: Well developed, in no acute distress  Head: normocephalic and atraumatic,. Chambers 3-4  Neck: Supple, no carotid bruits  Cardiac: Regular rate rhythm, no murmur  Musculoskeletal: No deformity  Neurological examination  Mentation: Alert oriented to time, place, history taking. Follows all commands speech and language fluent.  Cranial nerve II-XII: Pupils  were equal round reactive to light extraocular movements were full, visual field were full on confrontational test. Facial sensation and strength were normal. hearing was intact to finger rubbing bilaterally. Uvula tongue midline. head turning and shoulder shrug were normal and symmetric.Tongue protrusion into cheek strength was normal.  Motor: normal bulk and tone, full strength in the BUE, BLE, fine finger movements normal, no pronator drift. No focal weakness  Sensory: normal and symmetric to light touch, pinprick, and vibration  Coordination: finger-nose-finger, heel-to-shin bilaterally, no dysmetria  Reflexes: Brachioradialis 2/2, biceps 2/2, triceps 2/2, patellar 2/2, Achilles 2/2, plantar responses were flexor bilaterally.  Gait and Station: Rising up from seated position without assistance, normal stance, moderate stride, good arm swing, smooth turning, able to perform tiptoe, and heel walking without difficulty. Tandem gait is steady   DIAGNOSTIC DATA (LABS, IMAGING, TESTING) -   ASSESSMENT AND PLAN  43 y.o. year old female  has a past medical history of Migraine; Muscle tension headache; Anxiety and depression; Arthritis of facet joints at multiple vertebral levels;  here to followup. She is currently on nortriptyline 100 mg at night. She takes Imitrex and Maxalt acutely, alternating these drugs. She also takes Percocet. She never had her sleep study previously  ordered  Please stop by sleep lab and schedule study that is already  been ordered Continue Pamelor at current dose will refill Continue Percocet at current dose will refill Followup in 6 to 8 months Will need repeat MRI of lumbar spine before injections are done, pt wants to get sleep study out of the way first Dennie Bible, [facility], [facility], [facility] [address], [place], [place] [address] [phone]

## 2013-10-20 NOTE — Patient Instructions (Addendum)
Please stop by sleep lab and schedule study that is already  been ordered Continue Pamelor at current dose will refill Continue Percocet at current dose will refill Followup in 6 to 8 months Will need repeat MRI of lumbar spine before injections are done

## 2013-10-20 NOTE — Progress Notes (Signed)
I have read the note, and I agree with the clinical assessment and plan.  WILLIS,CHARLES KEITH   

## 2013-11-02 ENCOUNTER — Other Ambulatory Visit: Payer: Self-pay | Admitting: Neurology

## 2013-11-03 NOTE — Telephone Encounter (Signed)
Rx signed and faxed.

## 2013-11-22 ENCOUNTER — Telehealth: Payer: Self-pay | Admitting: Neurology

## 2013-11-22 NOTE — Telephone Encounter (Signed)
Patient calling to request a call back in order to explain her paperwork for her employer before she brings it in for [place] to fill out, please return call and advise.

## 2013-11-24 NOTE — Telephone Encounter (Signed)
Spoke to patient and she relayed that she already has FMLA for headaches from our office, but her employer is suggesting we complete another form for [employer] Act.  This would give her additional time that she needs for her husband's illness.  I asked her to bring it in and we would check with the physician before completing form.

## 2013-11-24 NOTE — Telephone Encounter (Signed)
We can fill out this form.

## 2013-11-25 DIAGNOSIS — Z0289 Encounter for other administrative examinations: Secondary | ICD-10-CM

## 2013-12-02 ENCOUNTER — Other Ambulatory Visit: Payer: Self-pay | Admitting: Obstetrics & Gynecology

## 2013-12-02 DIAGNOSIS — Z1231 Encounter for screening mammogram for malignant neoplasm of breast: Secondary | ICD-10-CM

## 2013-12-02 NOTE — Telephone Encounter (Signed)
I completed the patients FMLA form and placed it in NP's in-box to be signed.

## 2013-12-03 ENCOUNTER — Ambulatory Visit (INDEPENDENT_AMBULATORY_CARE_PROVIDER_SITE_OTHER): Payer: BC Managed Care – PPO | Admitting: Neurology

## 2013-12-03 VITALS — BP 134/92

## 2013-12-03 DIAGNOSIS — G4761 Periodic limb movement disorder: Secondary | ICD-10-CM

## 2013-12-03 DIAGNOSIS — G4733 Obstructive sleep apnea (adult) (pediatric): Secondary | ICD-10-CM

## 2013-12-03 DIAGNOSIS — F518 Other sleep disorders not due to a substance or known physiological condition: Secondary | ICD-10-CM

## 2013-12-03 DIAGNOSIS — G479 Sleep disorder, unspecified: Secondary | ICD-10-CM

## 2013-12-03 NOTE — Telephone Encounter (Signed)
Forms completed and signed and sent to MR for processing.

## 2013-12-04 NOTE — Sleep Study (Signed)
Please see the scanned sleep study interpretation located in the Procedure tab within the Chart Review section. 

## 2013-12-06 ENCOUNTER — Telehealth: Payer: Self-pay | Admitting: *Deleted

## 2013-12-06 NOTE — Telephone Encounter (Signed)
Form,[employer] received completed from [place] and [place] faxed 12/06/13.

## 2013-12-08 ENCOUNTER — Telehealth: Payer: Self-pay | Admitting: Neurology

## 2013-12-08 NOTE — Telephone Encounter (Signed)
Please call and notify the patient that the recent sleep study did not show any significant obstructive sleep apnea. Please inform patient that she can follow up with Dr. Jannifer Franklin or Cecille Rubin, NP as planned and we will send a copy of the sleep study report to Dr. Jannifer Franklin and pt's PCP and patient as well for her records.  Once you have spoken to patient, you can close this encounter.   Thanks,  Star Age, MD, PhD [facility])

## 2013-12-13 ENCOUNTER — Encounter: Payer: Self-pay | Admitting: Neurology

## 2013-12-13 NOTE — Telephone Encounter (Signed)
Patient was contacted and provided the results of her study that did not show OSA.  Patient instructed to follow up with Dr. Jannifer Franklin and or Cecille Rubin, NP.  The patient gave verbal permission to mail a copy of her test results.

## 2013-12-14 ENCOUNTER — Other Ambulatory Visit: Payer: Self-pay | Admitting: Neurology

## 2014-01-05 ENCOUNTER — Ambulatory Visit (HOSPITAL_COMMUNITY): Payer: BC Managed Care – PPO

## 2014-01-11 ENCOUNTER — Encounter (INDEPENDENT_AMBULATORY_CARE_PROVIDER_SITE_OTHER): Payer: Self-pay

## 2014-01-12 ENCOUNTER — Ambulatory Visit (HOSPITAL_COMMUNITY): Payer: BC Managed Care – PPO

## 2014-01-17 ENCOUNTER — Other Ambulatory Visit: Payer: Self-pay | Admitting: Nurse Practitioner

## 2014-01-17 ENCOUNTER — Other Ambulatory Visit: Payer: Self-pay | Admitting: Neurology

## 2014-01-17 MED ORDER — OXYCODONE-ACETAMINOPHEN 7.5-325 MG PO TABS: 1.0000 | ORAL_TABLET | Freq: Four times a day (QID) | ORAL | Status: DC | PRN

## 2014-01-17 NOTE — Telephone Encounter (Signed)
Patient requesting Rx refill for oxyCODONE-acetaminophen (PERCOCET) 7.5-325 MG per tablet.  Please call when ready for pick up.

## 2014-01-17 NOTE — Telephone Encounter (Signed)
Request entered, forwarded to provider for approval.  

## 2014-01-17 NOTE — Telephone Encounter (Signed)
I called the patient to let them know their Rx for Percocet was ready for pickup. Patient was instructed to bring Photo ID.

## 2014-01-17 NOTE — Telephone Encounter (Signed)
Rx signed and faxed.

## 2014-01-18 ENCOUNTER — Telehealth: Payer: Self-pay | Admitting: Nurse Practitioner

## 2014-01-18 NOTE — Telephone Encounter (Signed)
Patient stated spouse Zenya Hickam will pick up medication.  Informed patient spouse needs to bring his ID in order to pick up Rx.  Patient verbalized understanding.

## 2014-01-31 ENCOUNTER — Ambulatory Visit (HOSPITAL_COMMUNITY)
Admission: RE | Admit: 2014-01-31 | Discharge: 2014-01-31 | Disposition: A | Discharge status: Home / Self Care | Payer: Self-pay | Source: Ambulatory Visit | Attending: Obstetrics & Gynecology | Admitting: Obstetrics & Gynecology

## 2014-01-31 DIAGNOSIS — Z1231 Encounter for screening mammogram for malignant neoplasm of breast: Secondary | ICD-10-CM | POA: Insufficient documentation

## 2014-02-10 ENCOUNTER — Telehealth: Payer: Self-pay | Admitting: *Deleted

## 2014-02-10 MED ORDER — PREDNISONE 10 MG PO TABS: ORAL_TABLET | ORAL | Status: DC

## 2014-02-10 NOTE — Telephone Encounter (Signed)
Patient is calling stating that she has had a migraine since Sunday 03-09-14. Patient states that this happens twice a year and wanted to let someone know. Patient has an appt scheduled in April to see CM. Please advise

## 2014-02-10 NOTE — Telephone Encounter (Signed)
I called patient had to leave a message on voicemail. I will call in Prednisone 6 day dose pack since she has had a migraine for 5 days to see if we can break the headache. Call back for questions.

## 2014-02-15 ENCOUNTER — Telehealth: Payer: Self-pay | Admitting: *Deleted

## 2014-02-15 NOTE — Telephone Encounter (Signed)
Form,FMLA [employer] to [employer] to be completed 02-15-14.

## 2014-02-16 DIAGNOSIS — Z0289 Encounter for other administrative examinations: Secondary | ICD-10-CM

## 2014-02-21 ENCOUNTER — Telehealth: Payer: Self-pay | Admitting: *Deleted

## 2014-02-21 NOTE — Telephone Encounter (Signed)
Forms given back to Medical Records-Donna.

## 2014-02-21 NOTE — Telephone Encounter (Signed)
Given to NP-CM today.

## 2014-02-21 NOTE — Telephone Encounter (Signed)
Form,[employer] received,completed by [place] S[employer] and [place] faxed 02-21-14.

## 2014-03-18 ENCOUNTER — Telehealth: Payer: Self-pay | Admitting: Nurse Practitioner

## 2014-03-18 MED ORDER — PREDNISONE 10 MG PO TABS: ORAL_TABLET | ORAL | Status: DC

## 2014-03-18 NOTE — Telephone Encounter (Signed)
Please let patient know I called in the prednisone however this is the second time in 2 months. She needs to come to the office to evaluate the effectiveness of her current therapy. Please have her make an appointment.

## 2014-03-18 NOTE — Telephone Encounter (Signed)
Spoke to patient. Gave info per CM's previous note. She was appreciative. She has an appt on 05/02/14. She believes the change in weather is causing her migraines. Confirmed appt.

## 2014-03-18 NOTE — Telephone Encounter (Signed)
Pt is calling stating she needs for Michelle Holt to call in a Rx for predniSONE (DELTASONE) 10 MG tablet for her migraines.  She had a migraine for 4 days.  She thinks it's due to the weather. [facility] uses [facility] in [place].  Please call and advise.

## 2014-03-28 ENCOUNTER — Other Ambulatory Visit: Payer: Self-pay | Admitting: Nurse Practitioner

## 2014-03-28 ENCOUNTER — Telehealth: Payer: Self-pay

## 2014-03-28 MED ORDER — OXYCODONE-ACETAMINOPHEN 7.5-325 MG PO TABS: 1.0000 | ORAL_TABLET | Freq: Four times a day (QID) | ORAL | Status: DC | PRN

## 2014-03-28 NOTE — Telephone Encounter (Signed)
Request entered, forwarded to provider for approval.  

## 2014-03-28 NOTE — Telephone Encounter (Signed)
Patient requesting Rx refill for oxyCODONE-acetaminophen (PERCOCET) 7.5-325 MG per tablet.  Please call when ready for pick up.

## 2014-03-28 NOTE — Telephone Encounter (Signed)
Called patient and informed Rx ready for pick up at front desk. Patient verbalized understanding.  

## 2014-04-21 ENCOUNTER — Ambulatory Visit: Payer: BC Managed Care – PPO | Admitting: Nurse Practitioner

## 2014-05-02 ENCOUNTER — Ambulatory Visit (INDEPENDENT_AMBULATORY_CARE_PROVIDER_SITE_OTHER): Payer: BLUE CROSS/BLUE SHIELD | Admitting: Nurse Practitioner

## 2014-05-02 ENCOUNTER — Encounter: Payer: Self-pay | Admitting: Nurse Practitioner

## 2014-05-02 VITALS — BP 136/79 | HR 77 | Ht 66.0 in | Wt 186.0 lb

## 2014-05-02 DIAGNOSIS — M47817 Spondylosis without myelopathy or radiculopathy, lumbosacral region: Secondary | ICD-10-CM

## 2014-05-02 DIAGNOSIS — F411 Generalized anxiety disorder: Secondary | ICD-10-CM | POA: Insufficient documentation

## 2014-05-02 DIAGNOSIS — G43019 Migraine without aura, intractable, without status migrainosus: Secondary | ICD-10-CM

## 2014-05-02 MED ORDER — TRAMADOL HCL 50 MG PO TABS: ORAL_TABLET | ORAL | Status: DC

## 2014-05-02 MED ORDER — OXYCODONE-ACETAMINOPHEN 7.5-325 MG PO TABS: 1.0000 | ORAL_TABLET | Freq: Four times a day (QID) | ORAL | Status: DC | PRN

## 2014-05-02 NOTE — Patient Instructions (Addendum)
Decrease nortriptyline by 25 mg at night every week until medication is discontinued Start Topamax 25 mg at bedtime for 1 week then increase by 25 mg weekly until dose is 100 mg call when at full dose and let me know how headaches are doing. Made aware of the most common side effects of Topamax to include numbness tingling weight loss, kidney stone and importance of drinking plenty of water with this medication. Continue Maxalt and Imitrex acutely After you are  on the Topamax full dose keep a record of your headaches, time of day, stressors, etc  Follow-up in 4 months

## 2014-05-02 NOTE — Progress Notes (Signed)
[facility]  PATIENT: Michelle Holt DOB: 01-30-1970   REASON FOR VISIT: Follow-up for migraine, anxiety disorder , back pain HISTORY FROM: Patient    HISTORY OF PRESENT ILLNESS:Michelle Holt, 44 year old female returns for follow up. She has a history of migraine. She was last seen 10/20/13.  She says today her headaches have been worse in the last few months. She had a prednisone Dosepak in January and March. She was asked to come in to get a change in her therapy. Stress is a trigger for headaches. Her husband  diagnosed with lung cancer with mets to the brain. She also complains of weight gain and snoring with morning headaches.She is a shift Insurance underwriter. She does not have obstructive sleep apnea according to the sleep study.   She is using both Imitrex and Maxalt melt acutely. She has stopped her Verapamil. She is not aware of any foods or other triggers except for changes in the weather. She also complains of more problems with low back pain. She has had facet joint injections in the past the last being 2 years ago. She has generalized anxiety disorder for which Valium has been beneficial. She returns for reevaluation she needs refills on all of her medications. HISTORY:of migraine headache, muscle tension headache, and lumbosacral spondylosis. The patient has significant L5-S1 level facet joint arthritis that has responded in the past to injections. The patient recently was diagnosed with right-sided plantar fasciitis, and a heel spur. The patient received an injection for this, and she is doing somewhat better. The patient was placed on diclofenac for the plantar fasciitis. The patient has recently been under a lot of stress with an illness involving her mother. The patient had worsening of headaches, and the nortriptyline dose was increased to 70 mg at night. The patient has had an improvement in the daily headaches, but she continues to have occasional migraine. The patient  takes Imitrex for migraine, with some benefit. The patient returns to this office for an evaluation.    REVIEW OF SYSTEMS: Full 14 system review of systems performed and notable only for those listed, all others are neg:  Constitutional: Weight gain Cardiovascular: neg Ear/Nose/Throat: neg  Skin: neg Eyes: Blurred vision Respiratory: neg Gastroitestinal: Constipation Hematology/Lymphatic: neg  Endocrine: neg Musculoskeletal:neg Allergy/Immunology: neg Neurological: Migraine headaches  Psychiatric: Depression and anxiety Sleep : neg   ALLERGIES: Allergies  Allergen Reactions  . Actifed Cold-Allergy [Chlorpheniramine-Phenyleph Er] Rash  . Amoxicillin Rash  . Codeine Hives  . Erythromycin Rash  . Penicillins Rash  . Sudafed [Pseudoephedrine Hcl] Rash  . Zithromax [Azithromycin] Rash    HOME MEDICATIONS: Outpatient Prescriptions Prior to Visit  Medication Sig Dispense Refill  . Ascorbic Acid (VITAMIN C) 100 MG tablet Take by mouth daily.    . diazepam (VALIUM) 5 MG tablet TAKE 1 TABLET BY MOUTH AT BEDTIME AS NEEDED FOR ANXIETY 30 tablet 5  . esomeprazole (NEXIUM) 40 MG capsule Take 40 mg by mouth daily at 12 noon.    . nortriptyline (PAMELOR) 50 MG capsule Take 2 capsules (100 mg total) by mouth at bedtime. 60 capsule 5  . oxyCODONE-acetaminophen (PERCOCET) 7.5-325 MG per tablet Take 1 tablet by mouth every 6 (six) hours as needed for pain (Must last 28 days). 40 tablet 0  . promethazine (PHENERGAN) 25 MG tablet TAKE 1 TABLET EVERY 6 HOURS IF NEEDED 30 tablet 6  . rizatriptan (MAXALT-MLT) 10 MG disintegrating tablet Take 1 tablet (10 mg total) by mouth as needed  for migraine. May repeat in 2 hours if needed 10 tablet 4  . SUMAtriptan (IMITREX) 100 MG tablet TAKE 1 TABLET TWICE A DAY IF NEEDED 9 tablet 6  . traMADol (ULTRAM) 50 MG tablet TAKE 1 TABLET BY MOUTH EVERY 6 HOURS AS NEEDED **MUST LAST 28 DAYS** 50 tablet 5  . Linaclotide (LINZESS) 145 MCG CAPS capsule Take 1 capsule  (145 mcg total) by mouth daily. (Patient not taking: Reported on 05/02/2014) 30 capsule 2  . predniSONE (DELTASONE) 10 MG tablet 6 day dose pack take as directed (Patient not taking: Reported on 05/02/2014) 21 tablet 0   No facility-administered medications prior to visit.    PAST MEDICAL HISTORY: Past Medical History  Diagnosis Date  . Migraine   . Muscle tension headache   . Anxiety and depression   . Dyslipidemia   . Irritable bowel syndrome   . Insomnia   . Arthritis of facet joints at multiple vertebral levels     L5-S1  . Plantar fasciitis of right foot   . Constipation     PAST SURGICAL HISTORY: Past Surgical History  Procedure Laterality Date  . Uterine ablation    . Tubal ligation Bilateral   . Cholecystectomy  2008  . Esophagogastroduodenoscopy    . Colonoscopy N/A 08/13/2013    Procedure: COLONOSCOPY;  Surgeon: Rogene Houston, MD;  [facility]: [facility];  Service: Endoscopy;  Laterality: N/A;  230-moved to 145 Ann to notify pt    FAMILY HISTORY: Family History  Problem Relation Age of Onset  . Hypertension Mother   . Obesity Mother   . Diabetes Mother   . Peripheral vascular disease Father   . COPD Brother   . Osteoporosis Brother   . Colon cancer Neg Hx     SOCIAL HISTORY: History   Social History  . Marital Status: Married    Spouse Name: N/A  . Number of Children: 2  . Years of Education: 2-College   Occupational History  .  [employer]   Social History Main Topics  . Smoking status: Current Every Day Smoker -- 0.50 packs/day for 17 years    Types: Cigarettes  . Smokeless tobacco: Never Used     Comment: resume smoking 3-4 daily  . Alcohol Use: Yes     Comment: Drinks alcohol on occasion  . Drug Use: No  . Sexual Activity: Not on file   Other Topics Concern  . Not on file   Social History Narrative   Patient lives at home with her daughter.    Patient has 2 children.    Patient is Married.    Patient is right handed.     Patient has her Associates degree.           PHYSICAL EXAM  Filed Vitals:   05/02/14 0950  BP: 136/79  Pulse: 77  Height: 5\' 6"  (1.676 m)  Weight: 186 lb (84.369 kg)   Body mass index is 30.04 kg/(m^2). Generalized: Well developed, in no acute distress  Head: normocephalic and atraumatic,.  Neck: Supple, no carotid bruits  Cardiac: Regular rate rhythm, no murmur  Musculoskeletal: No deformity  Neurological examination  Mentation: Alert oriented to time, place, history taking. Follows all commands speech and language fluent.  Cranial nerve II-XII: Pupils were equal round reactive to light extraocular movements were full, visual field were full on confrontational test. Facial sensation and strength were normal. hearing was intact to finger rubbing bilaterally. Uvula tongue midline. head turning and shoulder  shrug were normal and symmetric.Tongue protrusion into cheek strength was normal.  Motor: normal bulk and tone, full strength in the BUE, BLE, fine finger movements normal, no pronator drift. No focal weakness  Sensory: normal and symmetric to light touch, pinprick, and vibration  Coordination: finger-nose-finger, heel-to-shin bilaterally, no dysmetria  Reflexes: Brachioradialis 2/2, biceps 2/2, triceps 2/2, patellar 2/2, Achilles 2/2, plantar responses were flexor bilaterally.  Gait and Station: Rising up from seated position without assistance, normal stance, moderate stride, good arm swing, smooth turning, able to perform tiptoe, and heel walking without difficulty. Tandem gait is steady    DIAGNOSTIC DATA (LABS, IMAGING, TESTING) - ASSESSMENT AND PLAN  44 y.o. year old female  has a past medical history of Migraine; Muscle tension headache; Anxiety and depression; Dyslipidemia; Irritable bowel syndrome; Insomnia; Arthritis of facet joints at multiple vertebral levels;. here to follow-up.  Decrease nortriptyline by 25 mg at night every week until medication is  discontinued Start Topamax 25 mg at bedtime for 1 week then increase by 25 mg weekly until dose is 100 mg Continue Maxalt and Imitrex acutely After your on the Topamax full dose keep a record of your headaches Continue Percocet for back pain Rx to patient Follow-up in 4-6 months Dennie Bible, [facility], [facility], [facility] [address] [place], [place] [address] [phone]

## 2014-05-04 ENCOUNTER — Telehealth: Payer: Self-pay | Admitting: Nurse Practitioner

## 2014-05-04 MED ORDER — NORTRIPTYLINE HCL 25 MG PO CAPS: ORAL_CAPSULE | ORAL | Status: DC

## 2014-05-04 MED ORDER — TOPIRAMATE 25 MG PO TABS: ORAL_TABLET | ORAL | Status: DC

## 2014-05-04 NOTE — Telephone Encounter (Signed)
Last OV note says: Decrease nortriptyline by 25 mg at night every week until medication is discontinued Start Topamax 25 mg at bedtime for 1 week then increase by 25 mg weekly until dose is 100 mg Rx's have been sent.  I called back to advise.  Got no answer.  Mailbox was full.  Unable to leave message.

## 2014-05-04 NOTE — Telephone Encounter (Signed)
Patient stated [facility] in [facility] hadn't received Rx's for topiramate (TOPAMAX) 25 MG tablet and decreased dosage for nortriptyline (PAMELOR) 25 MG capsule.  Please call and advise.

## 2014-05-27 ENCOUNTER — Other Ambulatory Visit: Payer: Self-pay

## 2014-05-27 MED ORDER — TOPIRAMATE 25 MG PO TABS: ORAL_TABLET | ORAL | Status: DC

## 2014-05-27 NOTE — Telephone Encounter (Signed)
Pharmacy requests 90 day Rx  

## 2014-06-27 ENCOUNTER — Other Ambulatory Visit: Payer: Self-pay | Admitting: Nurse Practitioner

## 2014-06-27 ENCOUNTER — Telehealth: Payer: Self-pay

## 2014-06-27 MED ORDER — OXYCODONE-ACETAMINOPHEN 7.5-325 MG PO TABS: 1.0000 | ORAL_TABLET | Freq: Four times a day (QID) | ORAL | Status: DC | PRN

## 2014-06-27 NOTE — Telephone Encounter (Signed)
Rx ready for pick up. 

## 2014-06-27 NOTE — Telephone Encounter (Signed)
Patient called and requested a refill on Rx. oxyCODONE-acetaminophen (PERCOCET) 7.5-325 MG per tablet. Advised patient it would be ready for pick up within 24 hours.

## 2014-06-27 NOTE — Telephone Encounter (Signed)
Request entered, forwarded to provider for approval.  

## 2014-07-25 ENCOUNTER — Other Ambulatory Visit: Payer: Self-pay | Admitting: Neurology

## 2014-07-26 NOTE — Telephone Encounter (Signed)
Originally prescribed at [place] on 04.25

## 2014-07-26 NOTE — Telephone Encounter (Signed)
Rx signed and faxed.

## 2014-07-28 ENCOUNTER — Other Ambulatory Visit: Payer: Self-pay | Admitting: Neurology

## 2014-08-30 ENCOUNTER — Other Ambulatory Visit: Payer: Self-pay | Admitting: Neurology

## 2014-08-30 MED ORDER — OXYCODONE-ACETAMINOPHEN 7.5-325 MG PO TABS: 1.0000 | ORAL_TABLET | Freq: Four times a day (QID) | ORAL | Status: DC | PRN

## 2014-08-30 NOTE — Telephone Encounter (Signed)
Request entered, forwarded to provider for review.  

## 2014-08-30 NOTE — Telephone Encounter (Signed)
Pt called to rs appt. Appt rs'd with NP to 9/6. Pt needs to get refill on oxyCODONE-acetaminophen (PERCOCET) 7.5-325 MG per tablet

## 2014-08-31 ENCOUNTER — Telehealth: Payer: Self-pay

## 2014-08-31 NOTE — Telephone Encounter (Signed)
Rx ready for pick up. 

## 2014-09-01 ENCOUNTER — Ambulatory Visit: Payer: BLUE CROSS/BLUE SHIELD | Admitting: Nurse Practitioner

## 2014-09-20 ENCOUNTER — Encounter: Payer: Self-pay | Admitting: Nurse Practitioner

## 2014-09-20 ENCOUNTER — Ambulatory Visit (INDEPENDENT_AMBULATORY_CARE_PROVIDER_SITE_OTHER): Payer: BLUE CROSS/BLUE SHIELD | Admitting: Nurse Practitioner

## 2014-09-20 VITALS — BP 120/70 | HR 78 | Ht 66.0 in | Wt 174.4 lb

## 2014-09-20 DIAGNOSIS — M47817 Spondylosis without myelopathy or radiculopathy, lumbosacral region: Secondary | ICD-10-CM | POA: Diagnosis not present

## 2014-09-20 DIAGNOSIS — G43019 Migraine without aura, intractable, without status migrainosus: Secondary | ICD-10-CM | POA: Diagnosis not present

## 2014-09-20 DIAGNOSIS — F411 Generalized anxiety disorder: Secondary | ICD-10-CM | POA: Diagnosis not present

## 2014-09-20 MED ORDER — TIZANIDINE HCL 4 MG PO TABS: 4.0000 mg | ORAL_TABLET | Freq: Every day | ORAL | Status: DC

## 2014-09-20 NOTE — Progress Notes (Signed)
[facility]  PATIENT: Michelle Holt DOB: [birth year]   REASON FOR VISIT: Follow-up for migraine, back pain, neck and shoulder pain HISTORY FROM: Patient    HISTORY OF PRESENT ILLNESS:Michelle Holt, 44 year old female returns for follow up. She has a history of migraine. She was last seen 05/02/2014 . She says today her headaches have been worse in the last few months. She had nausea and diarrhea on the Topamax which she was placed on at her last visit. She currently is at 50 mg daily at this is not beneficial for her headaches.  Stress is a trigger for headaches. Her husband diagnosed with lung cancer with mets to the brain. She also complains of weight gain and snoring with morning headaches.She currently is not working .She does not have obstructive sleep apnea according to the sleep study. She is using both Imitrex and Maxalt melt acutely. She has stopped her Verapamil. She is not aware of any foods or other triggers except for changes in the weather. She also complains of more problems with low back pain. She has had facet joint injections in the past the last being 2.5 years ago. She has generalized anxiety disorder for which Valium has been beneficial. She returns for reevaluation. HISTORY:of migraine headache, muscle tension headache, and lumbosacral spondylosis. The patient has significant L5-S1 level facet joint arthritis that has responded in the past to injections. The patient recently was diagnosed with right-sided plantar fasciitis, and a heel spur. The patient received an injection for this, and she is doing somewhat better. The patient was placed on diclofenac for the plantar fasciitis. The patient has recently been under a lot of stress with an illness involving her mother. The patient had worsening of headaches, and the nortriptyline dose was increased to 70 mg at night. The patient has had an improvement in the daily headaches, but she continues to have  occasional migraine. The patient takes Imitrex for migraine, with some benefit. The patient returns to this office for an evaluation.    REVIEW OF SYSTEMS: Full 14 system review of systems performed and notable only for those listed, all others are neg:  Constitutional: neg  Cardiovascular: neg Ear/Nose/Throat: neg  Skin: neg Eyes: Blurred vision Respiratory: neg Gastroitestinal: Diarrhea  Hematology/Lymphatic: neg  Endocrine: neg Musculoskeletal:neg Allergy/Immunology: neg Neurological: Headache Psychiatric: neg Sleep : Daytime sleepiness, snoring, sleep study negative   ALLERGIES: Allergies  Allergen Reactions  . Actifed Cold-Allergy [Chlorpheniramine-Phenyleph Er] Rash  . Amoxicillin Rash  . Codeine Hives  . Erythromycin Rash  . Penicillins Rash  . Sudafed [Pseudoephedrine Hcl] Rash  . Zithromax [Azithromycin] Rash    HOME MEDICATIONS: Outpatient Prescriptions Prior to Visit  Medication Sig Dispense Refill  . Ascorbic Acid (VITAMIN C) 100 MG tablet Take by mouth daily.    . diazepam (VALIUM) 5 MG tablet TAKE 1 TABLET BY MOUTH AT BEDTIME AS NEEDED FOR ANXIETY 30 tablet 5  . esomeprazole (NEXIUM) 40 MG capsule Take 40 mg by mouth daily at 12 noon.    Marland Kitchen oxyCODONE-acetaminophen (PERCOCET) 7.5-325 MG per tablet Take 1 tablet by mouth every 6 (six) hours as needed. 40 tablet 0  . promethazine (PHENERGAN) 25 MG tablet TAKE 1 TABLET BY MOUTH EVERY 6 HOURS AS NEEDED 30 tablet 6  . rizatriptan (MAXALT-MLT) 10 MG disintegrating tablet Take 1 tablet (10 mg total) by mouth as needed for migraine. May repeat in 2 hours if needed 10 tablet 4  . SUMAtriptan (IMITREX) 100 MG tablet TAKE 1  TABLET TWICE A DAY IF NEEDED 9 tablet 6  . topiramate (TOPAMAX) 25 MG tablet 1 po bedtime for 1 week then increase by 25mg  every week at bedtime Until dose is 100mg . (Patient taking differently: Take 50 mg by mouth daily. Taper by 25 mg weekly until discontinued) 360 tablet 0  . traMADol (ULTRAM) 50 MG  tablet TAKE 1 TABLET BY MOUTH EVERY 6 HOURS AS NEEDED **MUST LAST 28 DAYS** 50 tablet 5  . Linaclotide (LINZESS) 145 MCG CAPS capsule Take 1 capsule (145 mcg total) by mouth daily. (Patient not taking: Reported on 05/02/2014) 30 capsule 2  . nortriptyline (PAMELOR) 25 MG capsule 3 po bedtime for 1 week then 2 po bedtime for 1 week then 1 po bedtime for 1 week then discontinue (Patient not taking: Reported on 09/20/2014) 42 capsule 0   No facility-administered medications prior to visit.    PAST MEDICAL HISTORY: Past Medical History  Diagnosis Date  . Migraine   . Muscle tension headache   . Anxiety and depression   . Dyslipidemia   . Irritable bowel syndrome   . Insomnia   . Arthritis of facet joints at multiple vertebral levels     L5-S1  . Plantar fasciitis of right foot   . Constipation     PAST SURGICAL HISTORY: Past Surgical History  Procedure Laterality Date  . Uterine ablation    . Tubal ligation Bilateral   . Cholecystectomy  2008  . Esophagogastroduodenoscopy    . Colonoscopy N/A 08/13/2013    Procedure: COLONOSCOPY;  Surgeon: Rogene Houston, MD;  Location: [facility];  Service: Endoscopy;  Laterality: N/A;  230-moved to 145 Ann to notify pt    FAMILY HISTORY: Family History  Problem Relation Age of Onset  . Hypertension Mother   . Obesity Mother   . Diabetes Mother   . Peripheral vascular disease Father   . COPD Brother   . Osteoporosis Brother   . Colon cancer Neg Hx     SOCIAL HISTORY: Social History   Social History  . Marital Status: Married    Spouse Name: N/A  . Number of Children: 2  . Years of Education: 2-College   Occupational History  .  [employer]   Social History Main Topics  . Smoking status: Current Every Day Smoker -- 0.50 packs/day for 17 years    Types: Cigarettes  . Smokeless tobacco: Never Used     Comment: resume smoking 3-4 daily  . Alcohol Use: Yes     Comment: Drinks alcohol on occasion  . Drug Use: No  . Sexual  Activity: Not on file   Other Topics Concern  . Not on file   Social History Narrative   Patient lives at home with her daughter.    Patient has 2 children.    Patient is Married.    Patient is right handed.    Patient has her Associates degree.           PHYSICAL EXAM  Filed Vitals:   09/20/14 0928  BP: 120/70  Pulse: 78  Height: 5\' 6"  (1.676 m)  Weight: 174 lb 6.4 oz (79.107 kg)   Body mass index is 28.16 kg/(m^2). Generalized: Well developed, obese female in no acute distress  Head: normocephalic and atraumatic,.  Neck: Supple, no carotid bruits  Cardiac: Regular rate rhythm, no murmur  Musculoskeletal: No deformity  Neurological examination  Mentation: Alert oriented to time, place, history taking. Follows all commands speech and language  fluent.  Cranial nerve II-XII: Pupils were equal round reactive to light extraocular movements were full, visual field were full on confrontational test. Facial sensation and strength were normal. hearing was intact to finger rubbing bilaterally. Uvula tongue midline. head turning and shoulder shrug were normal and symmetric.Tongue protrusion into cheek strength was normal.  Motor: normal bulk and tone, full strength in the BUE, BLE, fine finger movements normal, no pronator drift. No focal weakness  Sensory: normal and symmetric to light touch, pinprick, and vibration  Coordination: finger-nose-finger, heel-to-shin bilaterally, no dysmetria  Reflexes: Brachioradialis 2/2, biceps 2/2, triceps 2/2, patellar 2/2, Achilles 2/2, plantar responses were flexor bilaterally.  Gait and Station: Rising up from seated position without assistance, normal stance, moderate stride, good arm swing, smooth turning, able to perform tiptoe, and heel walking without difficulty. Tandem gait is steady    DIAGNOSTIC DATA (LABS, IMAGING, TESTING) - ASSESSMENT AND PLAN  44 y.o. year old female  has a past medical history of Migraine; Muscle  tension headache; Anxiety and depression; Dyslipidemia; Irritable bowel syndrome; Insomnia; Arthritis of facet joints at multiple vertebral levels;  here to follow-up for her migraines and her back pain. Her migraines have not responded to Topamax. She also complains of occasional neck and shoulder pain  Taper Topamax by 25 mg weekly until discontinued Begin tizanidine 4 mg at at bedtime daily Given neck exercises to perform 2-3 times a day Continue Maxalt and Imitrex Follow-up in 4-6 months Dennie Bible, [facility], [facility], [facility] [address], [place], [place] [address] [phone]

## 2014-09-20 NOTE — Patient Instructions (Signed)
Taper Topamax 25 mg weekly until discontinued Begin tizanidine 4 mg at at bedtime daily Given neck exercises to perform 2-3 times a day Continue Maxalt and Imitrex Follow-up in 4-6 months

## 2014-09-20 NOTE — Progress Notes (Signed)
I have read the note, and I agree with the clinical assessment and plan.  WILLIS,CHARLES KEITH   

## 2014-10-03 ENCOUNTER — Other Ambulatory Visit: Payer: Self-pay

## 2014-10-03 MED ORDER — RIZATRIPTAN BENZOATE 10 MG PO TBDP: 10.0000 mg | ORAL_TABLET | ORAL | Status: DC | PRN

## 2014-10-13 ENCOUNTER — Encounter: Payer: Self-pay | Admitting: Obstetrics & Gynecology

## 2014-10-13 ENCOUNTER — Other Ambulatory Visit (HOSPITAL_COMMUNITY)
Admission: RE | Admit: 2014-10-13 | Discharge: 2014-10-13 | Disposition: A | Discharge status: Home / Self Care | Payer: BLUE CROSS/BLUE SHIELD | Source: Ambulatory Visit | Attending: Obstetrics & Gynecology | Admitting: Obstetrics & Gynecology

## 2014-10-13 ENCOUNTER — Ambulatory Visit (INDEPENDENT_AMBULATORY_CARE_PROVIDER_SITE_OTHER): Payer: BLUE CROSS/BLUE SHIELD | Admitting: Obstetrics & Gynecology

## 2014-10-13 VITALS — BP 110/80 | HR 76 | Ht 66.0 in | Wt 176.0 lb

## 2014-10-13 DIAGNOSIS — Z01411 Encounter for gynecological examination (general) (routine) with abnormal findings: Secondary | ICD-10-CM | POA: Insufficient documentation

## 2014-10-13 DIAGNOSIS — R102 Pelvic and perineal pain: Secondary | ICD-10-CM

## 2014-10-13 DIAGNOSIS — N857 Hematometra: Secondary | ICD-10-CM

## 2014-10-13 DIAGNOSIS — Z01419 Encounter for gynecological examination (general) (routine) without abnormal findings: Secondary | ICD-10-CM

## 2014-10-13 MED ORDER — KETOROLAC TROMETHAMINE 10 MG PO TABS: 10.0000 mg | ORAL_TABLET | Freq: Three times a day (TID) | ORAL | Status: DC | PRN

## 2014-10-13 NOTE — Progress Notes (Signed)
Patient ID: Michelle Holt, female   DOB: [birth year], 44 y.o.   MRN: [id number] Subjective:     Michelle Holt is a 44 y.o. female here for a routine exam.  No LMP recorded. Patient has had an ablation. No obstetric history on file. Birth Control Method:  btl+ablation Menstrual Calendar(currently): amenorrheic  Current complaints: pelvic pain cyclical stabbing.   Current acute medical issues:  none   Recent Gynecologic History No LMP recorded. Patient has had an ablation. Last Pap: years,  normal Last mammogram: 1/16,  normal  Past Medical History  Diagnosis Date  . Migraine   . Muscle tension headache   . Anxiety and depression   . Dyslipidemia   . Irritable bowel syndrome   . Insomnia   . Arthritis of facet joints at multiple vertebral levels     L5-S1  . Plantar fasciitis of right foot   . Constipation     Past Surgical History  Procedure Laterality Date  . Uterine ablation    . Tubal ligation Bilateral   . Cholecystectomy  2008  . Esophagogastroduodenoscopy    . Colonoscopy N/A 08/13/2013    Procedure: COLONOSCOPY;  Surgeon: Rogene Houston, MD;  [facility];  Service: Endoscopy;  Laterality: N/A;  230-moved to 145 Ann to notify pt    OB History    No data available      Social History   Social History  . Marital Status: Married    Spouse Name: N/A  . Number of Children: 2  . Years of Education: 2-College   Occupational History  .  [employer]   Social History Main Topics  . Smoking status: Current Every Day Smoker -- 0.50 packs/day for 17 years    Types: Cigarettes  . Smokeless tobacco: Never Used     Comment: resume smoking 3-4 daily  . Alcohol Use: Yes     Comment: Drinks alcohol on occasion  . Drug Use: No  . Sexual Activity: Not Asked   Other Topics Concern  . None   Social History Narrative   Patient lives at home with her daughter.    Patient has 2 children.    Patient is Married.    Patient is right handed.     Patient has her Associates degree.          Family History  Problem Relation Age of Onset  . Hypertension Mother   . Obesity Mother   . Diabetes Mother   . Peripheral vascular disease Father   . COPD Brother   . Osteoporosis Brother   . Colon cancer Neg Hx      Current outpatient prescriptions:  .  Ascorbic Acid (VITAMIN C) 100 MG tablet, Take by mouth daily., Disp: , Rfl:  .  diazepam (VALIUM) 5 MG tablet, TAKE 1 TABLET BY MOUTH AT BEDTIME AS NEEDED FOR ANXIETY, Disp: 30 tablet, Rfl: 5 .  esomeprazole (NEXIUM) 40 MG capsule, Take 40 mg by mouth daily at 12 noon., Disp: , Rfl:  .  oxyCODONE-acetaminophen (PERCOCET) 7.5-325 MG per tablet, Take 1 tablet by mouth every 6 (six) hours as needed., Disp: 40 tablet, Rfl: 0 .  promethazine (PHENERGAN) 25 MG tablet, TAKE 1 TABLET BY MOUTH EVERY 6 HOURS AS NEEDED, Disp: 30 tablet, Rfl: 6 .  rizatriptan (MAXALT-MLT) 10 MG disintegrating tablet, Take 1 tablet (10 mg total) by mouth as needed for migraine. May repeat in 2 hours if needed, Disp: 10 tablet, Rfl: 6 .  SUMAtriptan (IMITREX) 100 MG tablet, TAKE 1 TABLET TWICE A DAY IF NEEDED, Disp: 9 tablet, Rfl: 6 .  ketorolac (TORADOL) 10 MG tablet, Take 1 tablet (10 mg total) by mouth every 8 (eight) hours as needed., Disp: 15 tablet, Rfl: 0 .  tiZANidine (ZANAFLEX) 4 MG tablet, Take 1 tablet (4 mg total) by mouth at bedtime. (Patient not taking: Reported on 10/13/2014), Disp: 30 tablet, Rfl: 6 .  traMADol (ULTRAM) 50 MG tablet, TAKE 1 TABLET BY MOUTH EVERY 6 HOURS AS NEEDED **MUST LAST 28 DAYS** (Patient not taking: Reported on 10/13/2014), Disp: 50 tablet, Rfl: 5  Review of Systems  Review of Systems  Constitutional: Negative for fever, chills, weight loss, malaise/fatigue and diaphoresis.  HENT: Negative for hearing loss, ear pain, nosebleeds, congestion, sore throat, neck pain, tinnitus and ear discharge.   Eyes: Negative for blurred vision, double vision, photophobia, pain, discharge and  redness.  Respiratory: Negative for cough, hemoptysis, sputum production, shortness of breath, wheezing and stridor.   Cardiovascular: Negative for chest pain, palpitations, orthopnea, claudication, leg swelling and PND.  Gastrointestinal: negative for abdominal pain. Negative for heartburn, nausea, vomiting, diarrhea, constipation, blood in stool and melena.  Genitourinary: Negative for dysuria, urgency, frequency, hematuria and flank pain.  Musculoskeletal: Negative for myalgias, back pain, joint pain and falls.  Skin: Negative for itching and rash.  Neurological: Negative for dizziness, tingling, tremors, sensory change, speech change, focal weakness, seizures, loss of consciousness, weakness and headaches.  Endo/Heme/Allergies: Negative for environmental allergies and polydipsia. Does not bruise/bleed easily.  Psychiatric/Behavioral: Negative for depression, suicidal ideas, hallucinations, memory loss and substance abuse. The patient is not nervous/anxious and does not have insomnia.        Objective:  Blood pressure 110/80, pulse 76, height 5\' 6"  (1.676 m), weight 176 lb (79.833 kg).   Physical Exam  Vitals reviewed. Constitutional: She is oriented to person, place, and time. She appears well-developed and well-nourished.  HENT:  Head: Normocephalic and atraumatic.        Right Ear: External ear normal.  Left Ear: External ear normal.  Nose: Nose normal.  Mouth/Throat: Oropharynx is clear and moist.  Eyes: Conjunctivae and EOM are normal. Pupils are equal, round, and reactive to light. Right eye exhibits no discharge. Left eye exhibits no discharge. No scleral icterus.  Neck: Normal range of motion. Neck supple. No tracheal deviation present. No thyromegaly present.  Cardiovascular: Normal rate, regular rhythm, normal heart sounds and intact distal pulses.  Exam reveals no gallop and no friction rub.   No murmur heard. Respiratory: Effort normal and breath sounds normal. No  respiratory distress. She has no wheezes. She has no rales. She exhibits no tenderness.  GI: Soft. Bowel sounds are normal. She exhibits no distension and no mass. There is no tenderness. There is no rebound and no guarding.  Genitourinary:  Breasts no masses skin changes or nipple changes bilaterally      Vulva is normal without lesions Vagina is pink moist without discharge Cervix normal in appearance and pap is done Uterus is normal size shape and contour Adnexa is negative with normal sized ovaries   Musculoskeletal: Normal range of motion. She exhibits no edema and no tenderness.  Neurological: She is alert and oriented to person, place, and time. She has normal reflexes. She displays normal reflexes. No cranial nerve deficit. She exhibits normal muscle tone. Coordination normal.  Skin: Skin is warm and dry. No rash noted. No erythema. No pallor.  Psychiatric: She has a normal  mood and affect. Her behavior is normal. Judgment and thought content normal.       Assessment:    Healthy female exam.   cyclical pain Plan:    Mammogram ordered. Follow up in: 2 weeks.    Sonogram to evalute for hematometra Orders Placed This Encounter  Procedures  . US Pelvis Complete    Standing Status: Future     Number of Occurrences:      Standing Expiration Date: 12/13/2015    Order Specific Question:  Reason for Exam (SYMPTOM  OR DIAGNOSIS REQUIRED)    Answer:  s/p ablation with severe cramping.  evaluate for hematometra    Order Specific Question:  Preferred imaging location?    Answer:  Internal  . US Transvaginal Non-OB    Standing Status: Future     Number of Occurrences:      Standing Expiration Date: 12/13/2015    Order Specific Question:  Reason for Exam (SYMPTOM  OR DIAGNOSIS REQUIRED)    Answer:  s/p ablation with severe cramping.  evaluate for hematometra    Order Specific Question:  Preferred imaging location?    Answer:  Internal   Meds ordered this encounter  Medications   . ketorolac (TORADOL) 10 MG tablet    Sig: Take 1 tablet (10 mg total) by mouth every 8 (eight) hours as needed.    Dispense:  15 tablet    Refill:  0

## 2014-10-14 LAB — CYTOLOGY - PAP

## 2014-10-25 ENCOUNTER — Ambulatory Visit (INDEPENDENT_AMBULATORY_CARE_PROVIDER_SITE_OTHER): Payer: BLUE CROSS/BLUE SHIELD | Admitting: Obstetrics & Gynecology

## 2014-10-25 ENCOUNTER — Encounter: Payer: Self-pay | Admitting: Obstetrics & Gynecology

## 2014-10-25 ENCOUNTER — Ambulatory Visit (INDEPENDENT_AMBULATORY_CARE_PROVIDER_SITE_OTHER): Payer: BLUE CROSS/BLUE SHIELD

## 2014-10-25 VITALS — BP 100/80 | HR 80 | Wt 177.4 lb

## 2014-10-25 DIAGNOSIS — R102 Pelvic and perineal pain: Secondary | ICD-10-CM | POA: Diagnosis not present

## 2014-10-25 DIAGNOSIS — N857 Hematometra: Secondary | ICD-10-CM

## 2014-10-25 NOTE — Progress Notes (Signed)
Patient ID: Michelle Holt, female   DOB: 1970/05/30, 44 y.o.   MRN: [id number] US Transvaginal Non-ob  10/25/2014   GYNECOLOGIC SONOGRAM   Michelle Holt is a 44 y.o. s/p ablation for a pelvic sonogram for severe  cramping.  Uterus                      7.06 x 5.3 x 4 cm, heterogenous anteverted  uterus  Endometrium          13 mm, asymmetrical, thickened endometrium 64mm w/  two cysts within endometrium #1) 8 x 4 x 6 mm,(#2) 8 x 3 x 7 mm  Right ovary             2.2 x 2 x 1.5 cm, wnl  Left ovary                2.8 x 2.1 x 2.33 cm, wnl    Technician Comments:   PELVIC US TA/TV: heterogenous anteverted uterus,thickened endometrium 30mm  w/ two cysts within endometrium #1) 8 x 4 x 6 mm,(#2) 8 x 3 x 7 mm,normal  ov's bilat (mobile),10.8 x 8 x 61mm nabothian cyst,no free fluid seen    [employer] 10/25/2014 10:58 AM  Clinical Impression and recommendations:  I have reviewed the sonogram results above, combined with the patient's  current clinical course, below are my impressions and any appropriate  recommendations for management based on the sonographic findings.  Sonogram essentially normal, the endometrium is consistent with post  ablation changes with some very small cystic areas, all less than 10 mm The endometrium itself is quite thin again consistent with ablation, the  width is including the sub basilar cystic changes Uterus and ovaries are normal   Michelle,LUTHER H 10/25/2014 11:05 AM    US Pelvis Complete  10/25/2014   GYNECOLOGIC SONOGRAM   Michelle Holt is a 44 y.o. s/p ablation for a pelvic sonogram for severe  cramping.  Uterus                      7.06 x 5.3 x 4 cm, heterogenous anteverted  uterus  Endometrium          13 mm, asymmetrical, thickened endometrium 98mm w/  two cysts within endometrium #1) 8 x 4 x 6 mm,(#2) 8 x 3 x 7 mm  Right ovary             2.2 x 2 x 1.5 cm, wnl  Left ovary                2.8 x 2.1 x 2.33 cm, wnl    Technician Comments:   PELVIC US TA/TV: heterogenous anteverted  uterus,thickened endometrium 75mm  w/ two cysts within endometrium #1) 8 x 4 x 6 mm,(#2) 8 x 3 x 7 mm,normal  ov's bilat (mobile),10.8 x 8 x 79mm nabothian cyst,no free fluid seen    [employer] 10/25/2014 10:58 AM  Clinical Impression and recommendations:  I have reviewed the sonogram results above, combined with the patient's  current clinical course, below are my impressions and any appropriate  recommendations for management based on the sonographic findings.  Sonogram essentially normal, the endometrium is consistent with post  ablation changes with some very small cystic areas, all less than 10 mm The endometrium itself is quite thin again consistent with ablation, the  width is including the sub basilar cystic changes Uterus and ovaries are normal  Florian Buff 10/25/2014 11:05 AM     Reviewed sonogram with patient including viewing images  No anatomical abnormalities to explain pain, changes consistent with endometrial abalation  Did respond to toradol, has taken 2 Will monitor and if pain and or bleeding intensifies changes will re contact me  Til then continue toradol as needed  Follow up prn     Face to face time:  10 minutes  Greater than 50% of the visit time was spent in counseling and coordination of care with the patient.  The summary and outline of the counseling and care coordination is summarized in the note above.   All questions were answered.

## 2014-10-25 NOTE — Progress Notes (Signed)
PELVIC US TA/TV: heterogenous anteverted uterus,thickened endometrium 30mm w/ two cysts within endometrium #1) 8 x 4 x 6 mm,(#2) 8 x 3 x 7 mm,normal ov's bilat (mobile),10.8 x 8 x 66mm nabothian cyst,no free fluid seen

## 2014-11-14 ENCOUNTER — Telehealth: Payer: Self-pay

## 2014-11-14 ENCOUNTER — Telehealth: Payer: Self-pay | Admitting: Nurse Practitioner

## 2014-11-14 ENCOUNTER — Other Ambulatory Visit: Payer: Self-pay

## 2014-11-14 MED ORDER — OXYCODONE-ACETAMINOPHEN 7.5-325 MG PO TABS: 1.0000 | ORAL_TABLET | Freq: Four times a day (QID) | ORAL | Status: DC | PRN

## 2014-11-14 NOTE — Telephone Encounter (Signed)
Patient is calling to order her written Rx oxycodone-acetaminophen 7.5-325 mg per tablet.  Thanks!

## 2014-11-14 NOTE — Telephone Encounter (Signed)
Rx ready for pick up. 

## 2014-11-14 NOTE — Telephone Encounter (Signed)
Request has been entered, forwarded to provider for approval.  

## 2014-12-12 ENCOUNTER — Telehealth: Payer: Self-pay | Admitting: *Deleted

## 2014-12-12 ENCOUNTER — Telehealth: Payer: Self-pay | Admitting: Obstetrics & Gynecology

## 2014-12-12 MED ORDER — KETOROLAC TROMETHAMINE 10 MG PO TABS: 10.0000 mg | ORAL_TABLET | Freq: Three times a day (TID) | ORAL | Status: DC | PRN

## 2014-12-12 NOTE — Telephone Encounter (Signed)
Pt requesting a refill on the Toradol.

## 2015-01-05 ENCOUNTER — Other Ambulatory Visit: Payer: Self-pay | Admitting: Nurse Practitioner

## 2015-01-05 ENCOUNTER — Other Ambulatory Visit: Payer: Self-pay | Admitting: Neurology

## 2015-01-10 ENCOUNTER — Ambulatory Visit (INDEPENDENT_AMBULATORY_CARE_PROVIDER_SITE_OTHER): Payer: BLUE CROSS/BLUE SHIELD | Admitting: Nurse Practitioner

## 2015-01-10 ENCOUNTER — Encounter: Payer: Self-pay | Admitting: Nurse Practitioner

## 2015-01-10 VITALS — BP 102/64 | HR 68 | Ht 66.0 in | Wt 178.8 lb

## 2015-01-10 DIAGNOSIS — F411 Generalized anxiety disorder: Secondary | ICD-10-CM

## 2015-01-10 DIAGNOSIS — G43019 Migraine without aura, intractable, without status migrainosus: Secondary | ICD-10-CM

## 2015-01-10 MED ORDER — OXYCODONE-ACETAMINOPHEN 7.5-325 MG PO TABS: 1.0000 | ORAL_TABLET | Freq: Four times a day (QID) | ORAL | Status: DC | PRN

## 2015-01-10 MED ORDER — DIAZEPAM 5 MG PO TABS: ORAL_TABLET | ORAL | Status: DC

## 2015-01-10 NOTE — Patient Instructions (Signed)
Continue Imitrex and Maxalt acutely Continue tramadol as needed Percocet prescription given to patient Continue Valium when necessary at bedtime

## 2015-01-10 NOTE — Progress Notes (Signed)
[facility]  PATIENT: Michelle Holt DOB: 02/01/1970   REASON FOR VISIT: follow up for migraine, generalized anxiety disorder,back pain HISTORY FROM:patient    HISTORY OF PRESENT ILLNESS:Michelle Holt, 44 year old female returns for follow up. She has a history of migraine. She was last seen 09/30/14.  She says today her headaches have been better the last few months. She has been on multiple preventives in the past most recently tizanidine which she took for a month and found no real benefit.  She had nausea and diarrhea on the Topamax. Stress is a trigger for headaches. Her husband is diagnosed with lung cancer with mets to the brain. She also complains of weight gain and snoring with morning headaches.She currently is not working .She does not have obstructive sleep apnea according to the sleep study. She is using both Imitrex and Maxalt melt acutely. She has stopped her Verapamil. She is not aware of any foods or other triggers except for changes in the weather. She has been on nortriptyline in the past and had significant weight gain. She also has  low back pain. She has had facet joint injections in the past the last being 2.5 years ago. She has generalized anxiety disorder for which Valium has been beneficial. She returns for reevaluation. HISTORY:of migraine headache, muscle tension headache, and lumbosacral spondylosis. The patient has significant L5-S1 level facet joint arthritis that has responded in the past to injections. The patient recently was diagnosed with right-sided plantar fasciitis, and a heel spur. The patient received an injection for this, and she is doing somewhat better. The patient was placed on diclofenac for the plantar fasciitis. The patient has recently been under a lot of stress with an illness involving her mother. The patient had worsening of headaches, and the nortriptyline dose was increased to 70 mg at night. The patient has had an  improvement in the daily headaches, but she continues to have occasional migraine. The patient takes Imitrex for migraine, with some benefit. The patient returns to this office for an evaluation.    REVIEW OF SYSTEMS: Full 14 system review of systems performed and notable only for those listed, all others are neg:  Constitutional: neg  Cardiovascular: neg Ear/Nose/Throat: neg  Skin: neg Eyes: neg Respiratory: neg Gastroitestinal: neg  Hematology/Lymphatic: neg  Endocrine: neg Musculoskeletal:neg Allergy/Immunology: neg Neurological: headaches Psychiatric: neg Sleep :  snoring   ALLERGIES: Allergies  Allergen Reactions  . Actifed Cold-Allergy [Chlorpheniramine-Phenyleph Er] Rash  . Amoxicillin Rash  . Codeine Hives  . Erythromycin Rash  . Penicillins Rash  . Sudafed [Pseudoephedrine Hcl] Rash  . Zithromax [Azithromycin] Rash    HOME MEDICATIONS: Outpatient Prescriptions Prior to Visit  Medication Sig Dispense Refill  . diazepam (VALIUM) 5 MG tablet TAKE 1 TABLET BY MOUTH AT BEDTIME AS NEEDED FOR ANXIETY 30 tablet 5  . oxyCODONE-acetaminophen (PERCOCET) 7.5-325 MG tablet Take 1 tablet by mouth every 6 (six) hours as needed. 40 tablet 0  . promethazine (PHENERGAN) 25 MG tablet TAKE 1 TABLET BY MOUTH EVERY 6 HOURS AS NEEDED 30 tablet 6  . rizatriptan (MAXALT-MLT) 10 MG disintegrating tablet Take 1 tablet (10 mg total) by mouth as needed for migraine. May repeat in 2 hours if needed 10 tablet 6  . SUMAtriptan (IMITREX) 100 MG tablet TAKE ONE TABLET BY MOUTH TWICE DAILY AS NEEDED. 9 tablet 6  . traMADol (ULTRAM) 50 MG tablet TAKE ONE TABLET BY MOUTH EVERY 6 HOURS AS NEEDED. MUST LAST 28 DAYS. [place]  tablet 5  . Ascorbic Acid (VITAMIN C) 100 MG tablet Take by mouth daily. Reported on 01/10/2015    . esomeprazole (NEXIUM) 40 MG capsule Take 40 mg by mouth daily at 12 noon. Reported on 01/10/2015    . ketorolac (TORADOL) 10 MG tablet Take 1 tablet (10 mg total) by mouth every 8 (eight)  hours as needed. (Patient not taking: Reported on 01/10/2015) 15 tablet 0  . tiZANidine (ZANAFLEX) 4 MG tablet Take 1 tablet (4 mg total) by mouth at bedtime. (Patient not taking: Reported on 01/10/2015) 30 tablet 6   No facility-administered medications prior to visit.    PAST MEDICAL HISTORY: Past Medical History  Diagnosis Date  . Migraine   . Muscle tension headache   . Anxiety and depression   . Dyslipidemia   . Irritable bowel syndrome   . Insomnia   . Arthritis of facet joints at multiple vertebral levels (HCC)     L5-S1  . Plantar fasciitis of right foot   . Constipation     PAST SURGICAL HISTORY: Past Surgical History  Procedure Laterality Date  . Uterine ablation    . Tubal ligation Bilateral   . Cholecystectomy  2008  . Esophagogastroduodenoscopy    . Colonoscopy N/A 08/13/2013    Procedure: COLONOSCOPY;  Surgeon: Rogene Houston, MD;  Location: [facility];  Service: Endoscopy;  Laterality: N/A;  230-moved to 145 Ann to notify pt    FAMILY HISTORY: Family History  Problem Relation Age of Onset  . Hypertension Mother   . Obesity Mother   . Diabetes Mother   . Peripheral vascular disease Father   . COPD Brother   . Osteoporosis Brother   . Colon cancer Neg Hx     SOCIAL HISTORY: Social History   Social History  . Marital Status: Married    Spouse Name: N/A  . Number of Children: 2  . Years of Education: 2-College   Occupational History  .  [employer]   Social History Main Topics  . Smoking status: Former Smoker -- 0.50 packs/day for 17 years    Types: Cigarettes    Quit date: 12/11/2014  . Smokeless tobacco: Never Used     Comment: resume smoking 3-4 daily  . Alcohol Use: 0.0 oz/week    0 Standard drinks or equivalent per week     Comment: Drinks alcohol on occasion  . Drug Use: No  . Sexual Activity: Not on file   Other Topics Concern  . Not on file   Social History Narrative   Patient lives at home with her daughter.     Patient has 2 children.    Patient is Married.    Patient is right handed.    Patient has her Associates degree.           PHYSICAL EXAM  Filed Vitals:   01/10/15 0753  Height: 5\' 6"  (1.676 m)  Weight: 178 lb 12.8 oz (81.103 kg)   Body mass index is 28.87 kg/(m^2). Generalized: Well developed, obese female in no acute distress  Head: normocephalic and atraumatic,.  Neck: Supple, no carotid bruits  Cardiac: Regular rate rhythm, no murmur  Musculoskeletal: No deformity  Neurological examination  Mentation: Alert oriented to time, place, history taking. Follows all commands speech and language fluent.  Cranial nerve II-XII: Pupils were equal round reactive to light extraocular movements were full, visual field were full on confrontational test. Facial sensation and strength were normal. hearing was intact to finger  rubbing bilaterally. Uvula tongue midline. head turning and shoulder shrug were normal and symmetric.Tongue protrusion into cheek strength was normal.  Motor: normal bulk and tone, full strength in the BUE, BLE, fine finger movements normal, no pronator drift. No focal weakness  Sensory: normal and symmetric to light touch, pinprick, and vibration  Coordination: finger-nose-finger, heel-to-shin bilaterally, no dysmetria  Reflexes: Brachioradialis 2/2, biceps 2/2, triceps 2/2, patellar 2/2, Achilles 2/2, plantar responses were flexor bilaterally.  Gait and Station: Rising up from seated position without assistance, normal stance, moderate stride, good arm swing, smooth turning, able to perform tiptoe, and heel walking without difficulty. Tandem gait is steady   DIAGNOSTIC DATA (LABS, IMAGING, TESTING) - ASSESSMENT AND PLAN 44 y.o. year old female has a past medical history of Migraine; Muscle tension headache; Anxiety and depression; Dyslipidemia; Irritable bowel syndrome; Insomnia; Arthritis of facet joints at multiple vertebral levels; here to follow-up for  her migraines and her back pain. Her migraines have not responded to Topamax or to tizanidine, however she states her headaches are better because her husband who has brain cancer is actually doing better recently. The patient is a current patient of Dr. Jannifer Franklin  who is out of the office today . This note is sent to the work in doctor.     Continue Imitrex and Maxalt acutely Continue tramadol as needed Percocet prescription given to patient Continue Valium when necessary at bedtime RX to patient F/U in 6 months Vst time 25 min face to face Dennie Bible, [facility], [facility], APRN  [facility] [address] [place], [place] [address] [phone]

## 2015-01-12 NOTE — Progress Notes (Signed)
I reviewed note and agree with plan.   Penni Bombard, MD [id number], [id number] PM Certified in Neurology, Neurophysiology and Neuroimaging  [facility] [address], [place], [place] [address] [phone]

## 2015-01-23 ENCOUNTER — Ambulatory Visit: Payer: BLUE CROSS/BLUE SHIELD | Admitting: Nurse Practitioner

## 2015-03-31 ENCOUNTER — Other Ambulatory Visit: Payer: Self-pay | Admitting: Obstetrics & Gynecology

## 2015-03-31 DIAGNOSIS — Z1231 Encounter for screening mammogram for malignant neoplasm of breast: Secondary | ICD-10-CM

## 2015-04-06 ENCOUNTER — Ambulatory Visit (HOSPITAL_COMMUNITY)
Admission: RE | Admit: 2015-04-06 | Discharge: 2015-04-06 | Disposition: A | Discharge status: Home / Self Care | Payer: BLUE CROSS/BLUE SHIELD | Source: Ambulatory Visit | Attending: Obstetrics & Gynecology | Admitting: Obstetrics & Gynecology

## 2015-04-06 DIAGNOSIS — Z1231 Encounter for screening mammogram for malignant neoplasm of breast: Secondary | ICD-10-CM | POA: Diagnosis not present

## 2015-04-25 ENCOUNTER — Telehealth: Payer: Self-pay | Admitting: *Deleted

## 2015-04-25 ENCOUNTER — Other Ambulatory Visit: Payer: Self-pay | Admitting: Nurse Practitioner

## 2015-04-25 ENCOUNTER — Encounter: Payer: Self-pay | Admitting: *Deleted

## 2015-04-25 MED ORDER — OXYCODONE-ACETAMINOPHEN 7.5-325 MG PO TABS: 1.0000 | ORAL_TABLET | Freq: Four times a day (QID) | ORAL | Status: DC | PRN

## 2015-04-25 NOTE — Telephone Encounter (Signed)
Dr Jannifer Franklin signed printed rx oxycodone-acetaminophen.  Called and LVM on home number informing pt rx ready to be picked up in office. Gave [facility] office hours. Placed rx up front for pick up.

## 2015-04-25 NOTE — Telephone Encounter (Signed)
Patient is calling to order a written Rx oxycodone-acetaminophen 7.5-325 mg tablets.  Thanks! °

## 2015-04-25 NOTE — Progress Notes (Signed)
Error

## 2015-07-04 ENCOUNTER — Encounter: Payer: Self-pay | Admitting: Nurse Practitioner

## 2015-07-04 ENCOUNTER — Ambulatory Visit (INDEPENDENT_AMBULATORY_CARE_PROVIDER_SITE_OTHER): Payer: BLUE CROSS/BLUE SHIELD | Admitting: Nurse Practitioner

## 2015-07-04 VITALS — BP 115/75 | HR 85 | Ht 66.0 in | Wt 174.0 lb

## 2015-07-04 DIAGNOSIS — F411 Generalized anxiety disorder: Secondary | ICD-10-CM

## 2015-07-04 DIAGNOSIS — G43019 Migraine without aura, intractable, without status migrainosus: Secondary | ICD-10-CM | POA: Diagnosis not present

## 2015-07-04 MED ORDER — DIAZEPAM 5 MG PO TABS: ORAL_TABLET | ORAL | Status: DC

## 2015-07-04 MED ORDER — SUMATRIPTAN SUCCINATE 100 MG PO TABS: ORAL_TABLET | ORAL | Status: DC

## 2015-07-04 MED ORDER — OXYCODONE-ACETAMINOPHEN 7.5-325 MG PO TABS: 1.0000 | ORAL_TABLET | Freq: Four times a day (QID) | ORAL | Status: DC | PRN

## 2015-07-04 NOTE — Patient Instructions (Signed)
Continue Imitrex and Maxalt acutely Continue tramadol as needed Try Zonegran 100mg  2 at hs for migraine prevention Percocet prescription given to patient Continue Valium when necessary at bedtime  F/U in 6 months

## 2015-07-04 NOTE — Progress Notes (Signed)
[facility]  PATIENT: Michelle Holt DOB: [birth year]   REASON FOR VISIT: follow up for migraine, generalized anxiety disorder, back pain HISTORY FROM:patient    HISTORY OF PRESENT ILLNESS:UPDATE 06/20/2017CMMs. Michelle Holt, 45 year old female returns for follow-up. She has a history of migraines.Her triggers are stress and weather changes. She has been on multiple preventives in the past to include topamax nortriptyline and verapamil and tizanidine. She also has generalized anxiety disorder which she takes Valium. 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 Her husband who has brain cancer is actually doing better at present. She uses Percocet for back pain. Recent labs at primary care showed low B12 level otherwise normal. She returns for reevaluation   UPDATE 01/10/15 Michelle Holt, 45 year old female returns for follow up. She has a history of migraine. She was last seen 09/30/14. She says today her headaches have been better the last few months. She has been on multiple preventives in the past most recently tizanidine which she took for a month and found no real benefit. She had nausea and diarrhea on the Topamax. Stress is a trigger for headaches. Her husband is diagnosed with lung cancer with mets to the brain. She also complains of weight gain and snoring with morning headaches.She currently is not working .She does not have obstructive sleep apnea according to the sleep study. She is using both Imitrex and Maxalt melt acutely. She has stopped her Verapamil. She is not aware of any foods or other triggers except for changes in the weather. She has been on nortriptyline in the past and had significant weight gain. She also has low back pain. She has had facet joint injections in the past the last being 2.5 years ago. She has generalized anxiety disorder for which Valium has been  beneficial. She returns for reevaluation. HISTORY:of migraine headache, muscle tension headache, and lumbosacral spondylosis. The patient has significant L5-S1 level facet joint arthritis that has responded in the past to injections. The patient recently was diagnosed with right-sided plantar fasciitis, and a heel spur. The patient received an injection for this, and she is doing somewhat better. The patient was placed on diclofenac for the plantar fasciitis. The patient has recently been under a lot of stress with an illness involving her mother. The patient had worsening of headaches, and the nortriptyline dose was increased to 70 mg at night. The patient has had an improvement in the daily headaches, but she continues to have occasional migraine. The patient takes Imitrex for migraine, with some benefit. The patient returns to this office for an evaluation.    REVIEW OF SYSTEMS: Full 14 system review of systems performed and notable only for those listed, all others are neg:  Constitutional: fatigue  Cardiovascular: neg Ear/Nose/Throat: neg  Skin: neg Eyes: neg Respiratory: neg Gastroitestinal: neg  Hematology/Lymphatic: neg  Endocrine: neg Musculoskeletal:neg Allergy/Immunology: neg Neurological: headache Psychiatric: neg Sleep : frequent waking  ALLERGIES: Allergies  Allergen Reactions  . Actifed Cold-Allergy [Chlorpheniramine-Phenyleph Er] Rash  . Amoxicillin Rash  . Codeine Hives  . Erythromycin Rash  . Penicillins Rash  . Sudafed [Pseudoephedrine Hcl] Rash  . Zithromax [Azithromycin] Rash    HOME MEDICATIONS: Outpatient Prescriptions Prior to Visit  Medication Sig Dispense Refill  . diazepam (VALIUM) 5 MG tablet TAKE 1 TABLET BY MOUTH AT BEDTIME AS NEEDED FOR ANXIETY 30 tablet 5  . oxyCODONE-acetaminophen (PERCOCET) 7.5-325 MG tablet Take 1 tablet  by mouth every 6 (six) hours as needed. 40 tablet 0  . promethazine (PHENERGAN) 25 MG tablet TAKE 1 TABLET BY MOUTH EVERY 6  HOURS AS NEEDED 30 tablet 6  . rizatriptan (MAXALT-MLT) 10 MG disintegrating tablet Take 1 tablet (10 mg total) by mouth as needed for migraine. May repeat in 2 hours if needed 10 tablet 6  . SUMAtriptan (IMITREX) 100 MG tablet TAKE ONE TABLET BY MOUTH TWICE DAILY AS NEEDED. 9 tablet 6  . traMADol (ULTRAM) 50 MG tablet TAKE ONE TABLET BY MOUTH EVERY 6 HOURS AS NEEDED. MUST LAST 28 DAYS. 50 tablet 5  . ketorolac (TORADOL) 10 MG tablet Take 10 mg by mouth every 8 (eight) hours as needed. Reported on 07/04/2015  0   No facility-administered medications prior to visit.    PAST MEDICAL HISTORY: Past Medical History  Diagnosis Date  . Migraine   . Muscle tension headache   . Anxiety and depression   . Dyslipidemia   . Irritable bowel syndrome   . Insomnia   . Arthritis of facet joints at multiple vertebral levels (HCC)     L5-S1  . Plantar fasciitis of right foot   . Constipation     PAST SURGICAL HISTORY: Past Surgical History  Procedure Laterality Date  . Uterine ablation    . Tubal ligation Bilateral   . Cholecystectomy  2008  . Esophagogastroduodenoscopy    . Colonoscopy N/A 08/13/2013    Procedure: COLONOSCOPY;  Surgeon: Rogene Houston, MD;  [facility];  Service: Endoscopy;  Laterality: N/A;  230-moved to 145 Ann to notify pt    FAMILY HISTORY: Family History  Problem Relation Age of Onset  . Hypertension Mother   . Obesity Mother   . Diabetes Mother   . Peripheral vascular disease Father   . COPD Brother   . Osteoporosis Brother   . Colon cancer Neg Hx     SOCIAL HISTORY: Social History   Social History  . Marital Status: Married    Spouse Name: N/A  . Number of Children: 2  . Years of Education: 2-College   Occupational History  .  [employer]   Social History Main Topics  . Smoking status: Former Smoker -- 0.50 packs/day for 17 years    Types: Cigarettes    Quit date: 12/11/2014  . Smokeless tobacco: Never Used     Comment: resume  smoking 3-4 daily  . Alcohol Use: 0.6 oz/week    0 Standard drinks or equivalent, 1 Glasses of wine per week     Comment: Drinks alcohol on occasion  . Drug Use: No  . Sexual Activity: Not on file   Other Topics Concern  . Not on file   Social History Narrative   Patient lives at home with her daughter.    Patient has 2 children.    Patient is Married.    Patient is right handed.    Patient has her Associates degree.           PHYSICAL EXAM  Filed Vitals:   07/04/15 0904  BP: 115/75  Pulse: 85  Height: 5\' 6"  (1.676 m)  Weight: 174 lb (78.926 kg)   Body mass index is 28.1 kg/(m^2). Generalized: Well developed, obese female in no acute distress  Head: normocephalic and atraumatic,.  Neck: Supple, no carotid bruits  Cardiac: Regular rate rhythm, no murmur  Musculoskeletal: No deformity  Neurological examination  Mentation: Alert oriented to time, place, history taking. Follows all  commands speech and language fluent.  Cranial nerve II-XII: Pupils were equal round reactive to light extraocular movements were full, visual field were full on confrontational test. Facial sensation and strength were normal. hearing was intact to finger rubbing bilaterally. Uvula tongue midline. head turning and shoulder shrug were normal and symmetric.Tongue protrusion into cheek strength was normal.  Motor: normal bulk and tone, full strength in the BUE, BLE, fine finger movements normal, no pronator drift. No focal weakness  Sensory: normal and symmetric to light touch, pinprick, and vibration  Coordination: finger-nose-finger, heel-to-shin bilaterally, no dysmetria  Reflexes: Brachioradialis 2/2, biceps 2/2, triceps 2/2, patellar 2/2, Achilles 2/2, plantar responses were flexor bilaterally.  Gait and Station: Rising up from seated position without assistance, normal stance, moderate stride, good arm swing, smooth turning, able to perform tiptoe, and heel walking without difficulty.  Tandem gait is steady  DIAGNOSTIC DATA (LABS, IMAGING, TESTING) - ASSESSMENT AND PLAN 45 y.o. year old female has a past medical history of Migraine; Muscle tension headache; Anxiety and depression; Dyslipidemia; Irritable bowel syndrome; Insomnia; Arthritis of facet joints at multiple vertebral levels; here to follow-up for her migraines and her back pain. Her migraines have not responded to Topamax or to tizanidine, however she states her headaches are better because her husband who has brain cancer is actually doing better recently.    Continue Imitrex and Maxalt acutely Continue tramadol as needed Try Zonegran 100mg  2 at hs for migraine prevention Percocet prescription given to patient reviewed [place] narcotic registry Continue Valium when necessary at bedtime  F/U in 6 months  Dennie Bible, [facility], [facility], [facility] [address], [place], [place] [address] [phone]

## 2015-07-11 ENCOUNTER — Ambulatory Visit: Payer: BLUE CROSS/BLUE SHIELD | Admitting: Nurse Practitioner

## 2015-08-07 ENCOUNTER — Encounter: Payer: Self-pay | Admitting: Cardiology

## 2015-08-07 ENCOUNTER — Encounter: Payer: Self-pay | Admitting: *Deleted

## 2015-08-07 ENCOUNTER — Ambulatory Visit (INDEPENDENT_AMBULATORY_CARE_PROVIDER_SITE_OTHER): Payer: BLUE CROSS/BLUE SHIELD | Admitting: Cardiology

## 2015-08-07 VITALS — BP 122/79 | HR 69 | Ht 66.0 in | Wt 175.4 lb

## 2015-08-07 DIAGNOSIS — Z720 Tobacco use: Secondary | ICD-10-CM | POA: Diagnosis not present

## 2015-08-07 DIAGNOSIS — E785 Hyperlipidemia, unspecified: Secondary | ICD-10-CM | POA: Diagnosis not present

## 2015-08-07 DIAGNOSIS — R9431 Abnormal electrocardiogram [ECG] [EKG]: Secondary | ICD-10-CM

## 2015-08-07 DIAGNOSIS — R072 Precordial pain: Secondary | ICD-10-CM

## 2015-08-07 NOTE — Patient Instructions (Signed)
Medication Instructions:   Your physician recommends that you continue on your current medications as directed. Please refer to the Current Medication list given to you today.  Labwork: NONE  Testing/Procedures: Your physician has requested that you have a stress echocardiogram. For further information please visit [email or web address]. Please follow instruction sheet as given.  Follow-Up: We will call you with your results.  Any Other Special Instructions Will Be Listed Below (If Applicable).  If you need a refill on your cardiac medications before your next appointment, please call your pharmacy.

## 2015-08-07 NOTE — Progress Notes (Signed)
Cardiology Office Note  Date: 08/07/2015   ID: BRANA LUCHINI, DOB March 27, 1970, MRN [id number]  PCP: [name], MD  Consulting Cardiologist: Rozann Lesches, MD   Chief Complaint  Patient presents with  . Chest Pain  . Fatigue    History of Present Illness: Michelle Holt is a 45 y.o. female referred for cardiology consultation by Dr. Woody Seller.I reviewed her records. She reports a 4 month history of intermittent chest discomfort, describes a sharp pain in the left side of her chest, sometimes radiates up into the neck. This has awaken her from sleep sometimes, may occur with exertion but not consistently. She also reports indigestion symptoms, was taking Nexium regularly but not at this time. In addition she has felt fatigued with activity, reports stress at work.  She reportedly had a normal nuclear stress test at [facility] back in 2012 per review of Dr. Woody Seller chart records, I do not have the report. Patient recalls having this done.  I reviewed her medications which are outlined below. He does not report any new medications. Has frequent migraine headaches.  Past Medical History:  Diagnosis Date  . Anxiety and depression   . Arthritis of facet joints at multiple vertebral levels (HCC)    L5-S1  . Constipation   . Dyslipidemia   . Insomnia   . Irritable bowel syndrome   . Migraine   . Muscle tension headache   . Plantar fasciitis of right foot     Past Surgical History:  Procedure Laterality Date  . CHOLECYSTECTOMY  2008  . COLONOSCOPY N/A 08/13/2013   Procedure: COLONOSCOPY;  Surgeon: Rogene Houston, MD;  [facility];  Service: Endoscopy;  Laterality: N/A;  230-moved to 145 Ann to notify pt  . ESOPHAGOGASTRODUODENOSCOPY    . TUBAL LIGATION Bilateral   . UTERINE ABLATION      Current Outpatient Prescriptions  Medication Sig Dispense Refill  . diazepam (VALIUM) 5 MG tablet TAKE 1 TABLET BY MOUTH AT BEDTIME AS NEEDED FOR ANXIETY 30 tablet 5  .  oxyCODONE-acetaminophen (PERCOCET) 7.5-325 MG tablet Take 1 tablet by mouth every 6 (six) hours as needed. 40 tablet 0  . promethazine (PHENERGAN) 25 MG tablet TAKE 1 TABLET BY MOUTH EVERY 6 HOURS AS NEEDED 30 tablet 6  . rizatriptan (MAXALT-MLT) 10 MG disintegrating tablet Take 1 tablet (10 mg total) by mouth as needed for migraine. May repeat in 2 hours if needed 10 tablet 6  . SUMAtriptan (IMITREX) 100 MG tablet TAKE ONE TABLET BY MOUTH PRN UP to TWICE DAILY AS NEEDED. 9 tablet 6  . traMADol (ULTRAM) 50 MG tablet TAKE ONE TABLET BY MOUTH EVERY 6 HOURS AS NEEDED. MUST LAST 28 DAYS. 50 tablet 5  . vitamin B-12 (CYANOCOBALAMIN) 1000 MCG tablet Take 1,000 mcg by mouth daily.     No current facility-administered medications for this visit.    Allergies:  Actifed cold-allergy [chlorpheniramine-phenyleph er]; Amoxicillin; Codeine; Erythromycin; Penicillins; Sudafed [pseudoephedrine hcl]; and Zithromax [azithromycin]   Social History: The patient  reports that she has been smoking Cigarettes.  She has a 8.50 pack-year smoking history. She has never used smokeless tobacco. She reports that she drinks about 0.6 oz of alcohol per week . She reports that she does not use drugs.   Family History: The patient's family history includes COPD in her brother; Diabetes in her mother; Hypertension in her mother; Obesity in her mother; Osteoporosis in her brother; Peripheral vascular disease in her father.   ROS:  Please see the history of present illness. Otherwise, complete review of systems is positive for frequent headaches.  All other systems are reviewed and negative.   Physical Exam: VS:  BP 122/79   Pulse 69   Ht 5\' 6"  (1.676 m)   Wt 175 lb 6.4 oz (79.6 kg)   SpO2 98%   BMI 28.31 kg/m , BMI Body mass index is 28.31 kg/m.  Wt Readings from Last 3 Encounters:  08/07/15 175 lb 6.4 oz (79.6 kg)  07/04/15 174 lb (78.9 kg)  01/10/15 178 lb 12.8 oz (81.1 kg)    General: Overweight woman, appears  comfortable at rest. HEENT: Conjunctiva and lids normal, oropharynx clear. Neck: Supple, no elevated JVP or carotid bruits, no thyromegaly. Lungs: Clear to auscultation, nonlabored breathing at rest. Cardiac: Regular rate and rhythm, no S3 or significant systolic murmur, no pericardial rub. Abdomen: Soft, nontender, bowel sounds present, no guarding or rebound. Extremities: No pitting edema, distal pulses 2+. Skin: Warm and dry. Musculoskeletal: No kyphosis. Neuropsychiatric: Alert and oriented x3, affect grossly appropriate.  ECG: I personally reviewed the tracing from 07/20/2015 which showed sinus rhythm with decreased R wave progression.  Recent Labwork:  June 2015: Cholesterol 235, triglycerides 100, HDL 89, LDL 126, hemoglobin 14, platelets 345, BUN 12, creatinine 0.7, potassium 5.1, AST 44, ALT 87, TSH 0.77  Other Studies Reviewed Today:  Echocardiogram 07/24/2015 [facility]): LVEF [id number], normal diastolic function, trace mitral regurgitation, trace tricuspid regurgitation, no pericardial effusion.  Assessment and Plan:  1. Precordial pain, typical and atypical features from the perspective of ischemia. Reports recurring symptoms over the last 4 months. No family history of premature CAD. She does have a history of tobacco use. ECG shows decreased anterior R-wave progression, although echocardiography shows normal LVEF without focal wall motion abnormalities or valvular abnormalities. Plan is to obtain an exercise echocardiogram for ischemic evaluation, last assessment was in 2012.  2. History of intermittent tobacco use. Smoking cessation discussed.  3. Recurring migraine headaches. She uses Imitrex. No obvious worsening chest pain symptoms when she does this.  4. Hyperlipidemia by history, LDL 126 and HDL 89 in June 2015.  Current medicines were reviewed with the patient today.   Orders Placed This Encounter  Procedures  . ECHOCARDIOGRAM STRESS TEST     Disposition: Call with results.  Signed, Satira Sark, MD, [facility] 08/07/2015 10:39 AM    [facility] [place], [place], [place] [address] Phone: [phone]; Fax: [phone]

## 2015-08-17 ENCOUNTER — Inpatient Hospital Stay (HOSPITAL_COMMUNITY): Admission: RE | Admit: 2015-08-17 | Payer: BLUE CROSS/BLUE SHIELD | Source: Ambulatory Visit

## 2015-08-17 ENCOUNTER — Ambulatory Visit (HOSPITAL_COMMUNITY)
Admission: RE | Admit: 2015-08-17 | Discharge: 2015-08-17 | Disposition: A | Discharge status: Home / Self Care | Payer: BLUE CROSS/BLUE SHIELD | Source: Ambulatory Visit | Attending: Cardiology | Admitting: Cardiology

## 2015-08-17 ENCOUNTER — Telehealth: Payer: Self-pay | Admitting: *Deleted

## 2015-08-17 DIAGNOSIS — R072 Precordial pain: Secondary | ICD-10-CM | POA: Diagnosis not present

## 2015-08-17 DIAGNOSIS — R079 Chest pain, unspecified: Secondary | ICD-10-CM | POA: Diagnosis present

## 2015-08-17 LAB — ECHOCARDIOGRAM STRESS TEST
Estimated workload: 9.7 METS
Exercise duration (min): 9 min
Exercise duration (sec): 6 s
MPHR: 175 {beats}/min
Peak HR: 155 {beats}/min
Percent HR: 88 %
RPE: 17
Rest HR: 71 {beats}/min

## 2015-08-17 NOTE — Progress Notes (Signed)
*  PRELIMINARY RESULTS* Echocardiogram Echocardiogram Stress Test has been performed.  Samuel Germany 08/17/2015, 10:18 AM

## 2015-08-17 NOTE — Telephone Encounter (Signed)
Patient informed and copy sent to PCP. 

## 2015-08-17 NOTE — Telephone Encounter (Signed)
-----   Message from Satira Sark, MD sent at 08/17/2015 12:54 PM EDT ----- Results reviewed. Please let her know that the stress test was normal and overall reassuring. No further cardiac testing is anticipated at this time. A copy of this test should be forwarded to [name], MD.

## 2015-10-16 ENCOUNTER — Other Ambulatory Visit: Payer: Self-pay | Admitting: Nurse Practitioner

## 2015-10-16 MED ORDER — OXYCODONE-ACETAMINOPHEN 7.5-325 MG PO TABS: 1.0000 | ORAL_TABLET | Freq: Four times a day (QID) | ORAL | 0 refills | Status: DC | PRN

## 2015-10-16 NOTE — Telephone Encounter (Signed)
Patient called to request refill of oxyCODONE-acetaminophen (PERCOCET) 7.5-325 MG tablet °

## 2015-10-17 NOTE — Telephone Encounter (Signed)
Rx printed, signed, up front for pick-up. 

## 2015-10-18 ENCOUNTER — Other Ambulatory Visit: Payer: Self-pay | Admitting: Nurse Practitioner

## 2015-10-20 ENCOUNTER — Other Ambulatory Visit: Payer: Self-pay | Admitting: *Deleted

## 2015-10-20 MED ORDER — TRAMADOL HCL 50 MG PO TABS: ORAL_TABLET | ORAL | 5 refills | Status: DC

## 2015-10-20 MED ORDER — PROMETHAZINE HCL 25 MG PO TABS: 25.0000 mg | ORAL_TABLET | Freq: Four times a day (QID) | ORAL | 6 refills | Status: DC | PRN

## 2015-10-20 NOTE — Telephone Encounter (Signed)
Fax confirmation received [facility]  [phone].  Tramadol.

## 2016-01-10 ENCOUNTER — Ambulatory Visit (INDEPENDENT_AMBULATORY_CARE_PROVIDER_SITE_OTHER): Payer: BLUE CROSS/BLUE SHIELD | Admitting: Nurse Practitioner

## 2016-01-10 ENCOUNTER — Encounter: Payer: Self-pay | Admitting: Nurse Practitioner

## 2016-01-10 ENCOUNTER — Telehealth: Payer: Self-pay | Admitting: *Deleted

## 2016-01-10 VITALS — BP 92/54 | HR 78 | Resp 18 | Ht 66.0 in | Wt 181.0 lb

## 2016-01-10 DIAGNOSIS — F411 Generalized anxiety disorder: Secondary | ICD-10-CM | POA: Diagnosis not present

## 2016-01-10 DIAGNOSIS — G43019 Migraine without aura, intractable, without status migrainosus: Secondary | ICD-10-CM

## 2016-01-10 DIAGNOSIS — M47817 Spondylosis without myelopathy or radiculopathy, lumbosacral region: Secondary | ICD-10-CM | POA: Diagnosis not present

## 2016-01-10 MED ORDER — DIAZEPAM 5 MG PO TABS: ORAL_TABLET | ORAL | 5 refills | Status: DC

## 2016-01-10 MED ORDER — ZONISAMIDE 100 MG PO CAPS: 200.0000 mg | ORAL_CAPSULE | Freq: Every day | ORAL | 6 refills | Status: DC

## 2016-01-10 MED ORDER — OXYCODONE-ACETAMINOPHEN 7.5-325 MG PO TABS: 1.0000 | ORAL_TABLET | Freq: Four times a day (QID) | ORAL | 0 refills | Status: DC | PRN

## 2016-01-10 MED ORDER — SUMATRIPTAN SUCCINATE 100 MG PO TABS: ORAL_TABLET | ORAL | 6 refills | Status: DC

## 2016-01-10 NOTE — Telephone Encounter (Signed)
I called pt and she has not been to any other prescribers for her valium.  I relayed that the controlled substance registry shows a [place] prescriber.  I then called [facility] and spoke to [facility] pharmacist.  She stated that the last prescription was 07-04-15 with 6 refills by CM/NP.  Pt has received the 5th refill 12/30/15, and the 6th (last refill the prescription would be expired.  The registry is correct in when pt has received.  Amy stated that the [facility] [id number] [place] # would be a student's (and that they had CM at the [address] address). She updated the information (with Dennie Bible, and repeated her current [facility] and [place] neurology address).

## 2016-01-10 NOTE — Progress Notes (Signed)
[facility]  PATIENT: Michelle Holt DOB: 1970/12/20   REASON FOR VISIT: follow up for migraine, generalized anxiety disorder, back pain HISTORY FROM:patient    HISTORY OF PRESENT ILLNESS:UPDATE 12/27/2017CMMs. Holt, 45 year old female returns for follow-up. She has a history of migraines.Her triggers are stress and weather changes. She has been on multiple preventives in the past to include topamax nortriptyline and verapamil and tizanidine. She also has generalized anxiety disorder which she takes Valium. She uses Imitrex and Maxalt acutely She has found employment locally in [place] in [place] control at [facility]. Her husband who has brain cancer is actually doing better at present. She uses Percocet for back pain. Labs are done at PCP. She returns for reevaluation   UPDATE 01/10/15 CMMs Michelle Holt, 45 year old female returns for follow up. She has a history of migraine. She was last seen 09/30/14. She says today her headaches have been better the last few months. She has been on multiple preventives in the past most recently tizanidine which she took for a month and found no real benefit. She had nausea and diarrhea on the Topamax. Stress is a trigger for headaches. Her husband is diagnosed with lung cancer with mets to the brain. She also complains of weight gain and snoring with morning headaches.She currently is not working .She does not have obstructive sleep apnea according to the sleep study. She is using both Imitrex and Maxalt melt acutely. She has stopped her Verapamil. She is not aware of any foods or other triggers except for changes in the weather. She has been on nortriptyline in the past and had significant weight gain. She also has low back pain. She has had facet joint injections in the past the last being 2.5 years ago. She has generalized anxiety disorder for which Valium has been beneficial. She returns for reevaluation. HISTORY:of migraine  headache, muscle tension headache, and lumbosacral spondylosis. The patient has significant L5-S1 level facet joint arthritis that has responded in the past to injections. The patient recently was diagnosed with right-sided plantar fasciitis, and a heel spur. The patient received an injection for this, and she is doing somewhat better. The patient was placed on diclofenac for the plantar fasciitis. The patient has recently been under a lot of stress with an illness involving her mother. The patient had worsening of headaches, and the nortriptyline dose was increased to 70 mg at night. The patient has had an improvement in the daily headaches, but she continues to have occasional migraine. The patient takes Imitrex for migraine, with some benefit. The patient returns to this office for an evaluation.    REVIEW OF SYSTEMS: Full 14 system review of systems performed and notable only for those listed, all others are neg:  Constitutional: fatigue  Cardiovascular: neg Ear/Nose/Throat: neg  Skin: neg Eyes: neg Respiratory: neg Gastroitestinal: neg  Hematology/Lymphatic: neg  Endocrine: neg Musculoskeletal:neg Allergy/Immunology: neg Neurological: headache Psychiatric: neg Sleep : frequent waking  ALLERGIES: Allergies  Allergen Reactions  . Actifed Cold-Allergy [Chlorpheniramine-Phenyleph Er] Rash  . Amoxicillin Rash  . Codeine Hives  . Erythromycin Rash  . Penicillins Rash  . Sudafed [Pseudoephedrine Hcl] Rash  . Zithromax [Azithromycin] Rash    HOME MEDICATIONS: Outpatient Medications Prior to Visit  Medication Sig Dispense Refill  . diazepam (VALIUM) 5 MG tablet TAKE 1 TABLET BY MOUTH AT BEDTIME AS NEEDED FOR ANXIETY 30 tablet 5  . oxyCODONE-acetaminophen (PERCOCET) 7.5-325 MG tablet Take 1 tablet by mouth every 6 (six) hours as needed. [place]  tablet 0  . promethazine (PHENERGAN) 25 MG tablet Take 1 tablet (25 mg total) by mouth every 6 (six) hours as needed. 30 tablet 6  . rizatriptan  (MAXALT-MLT) 10 MG disintegrating tablet TAKE ONE TABLET BY MOUTH AS NEEDED FOR MIGRAINE, MAY REPEAT IN TWO HOURS IF NEEDED 10 tablet 5  . SUMAtriptan (IMITREX) 100 MG tablet TAKE ONE TABLET BY MOUTH PRN UP to TWICE DAILY AS NEEDED. 9 tablet 6  . traMADol (ULTRAM) 50 MG tablet TAKE ONE TABLET BY MOUTH EVERY 6 HOURS AS NEEDED. MUST LAST 28 DAYS. 50 tablet 5  . vitamin B-12 (CYANOCOBALAMIN) 1000 MCG tablet Take 1,000 mcg by mouth daily.     No facility-administered medications prior to visit.     PAST MEDICAL HISTORY: Past Medical History:  Diagnosis Date  . Anxiety and depression   . Arthritis of facet joints at multiple vertebral levels (HCC)    L5-S1  . Constipation   . Dyslipidemia   . Insomnia   . Irritable bowel syndrome   . Migraine   . Muscle tension headache   . Plantar fasciitis of right foot     PAST SURGICAL HISTORY: Past Surgical History:  Procedure Laterality Date  . CHOLECYSTECTOMY  2008  . COLONOSCOPY N/A 08/13/2013   Procedure: COLONOSCOPY;  Surgeon: Rogene Houston, MD;  [facility];  Service: Endoscopy;  Laterality: N/A;  230-moved to 145 Ann to notify pt  . ESOPHAGOGASTRODUODENOSCOPY    . TUBAL LIGATION Bilateral   . UTERINE ABLATION      FAMILY HISTORY: Family History  Problem Relation Age of Onset  . Hypertension Mother   . Obesity Mother   . Diabetes Mother   . Peripheral vascular disease Father   . COPD Brother   . Osteoporosis Brother   . Colon cancer Neg Hx     SOCIAL HISTORY: Social History   Social History  . Marital status: Married    Spouse name: N/A  . Number of children: 2  . Years of education: 2-College   Occupational History  .  [employer]   Social History Main Topics  . Smoking status: Current Every Day Smoker    Packs/day: 0.50    Years: 17.00    Types: Cigarettes    Last attempt to quit: 12/11/2014  . Smokeless tobacco: Never Used     Comment: started back September 2015   . Alcohol use 0.6 oz/week     1 Glasses of wine per week     Comment: Drinks alcohol on occasion  . Drug use: No  . Sexual activity: Not on file   Other Topics Concern  . Not on file   Social History Narrative   Patient lives at home with her daughter.    Patient has 2 children.    Patient is Married.    Patient is right handed.    Patient has her Associates degree.           PHYSICAL EXAM  Vitals:   01/10/16 0755  BP: (!) 92/54  Pulse: 78  Resp: 18  Weight: 181 lb (82.1 kg)  Height: 5\' 6"  (1.676 m)   Body mass index is 29.21 kg/m. Generalized: Well developed, obese female in no acute distress  Head: normocephalic and atraumatic,.  Neck: Supple, no carotid bruits  Cardiac: Regular rate rhythm, no murmur  Musculoskeletal: No deformity  Neurological examination  Mentation: Alert oriented to time, place, history taking. Follows all commands speech and language fluent.  Cranial nerve II-XII: Pupils were equal round reactive to light extraocular movements were full, visual field were full on confrontational test. Facial sensation and strength were normal. hearing was intact to finger rubbing bilaterally. Uvula tongue midline. head turning and shoulder shrug were normal and symmetric.Tongue protrusion into cheek strength was normal.  Motor: normal bulk and tone, full strength in the BUE, BLE, fine finger movements normal, no pronator drift. No focal weakness  Sensory: normal and symmetric to light touch, pinprick, and vibration  Coordination: finger-nose-finger, heel-to-shin bilaterally, no dysmetria  Reflexes: Brachioradialis 2/2, biceps 2/2, triceps 2/2, patellar 2/2, Achilles 2/2, plantar responses were flexor bilaterally.  Gait and Station: Rising up from seated position without assistance, normal stance, moderate stride, good arm swing, smooth turning, able to perform tiptoe, and heel walking without difficulty. Tandem gait is steady  DIAGNOSTIC DATA (LABS, IMAGING,  TESTING) - ASSESSMENT AND PLAN 45 y.o. year old female has a past medical history of Migraine; Muscle tension headache; Anxiety and depression; Dyslipidemia; Irritable bowel syndrome; Insomnia; Arthritis of facet joints at multiple vertebral levels; here to follow-up for her migraines and her back pain. Her migraines have not responded to Topamax or to tizanidine, however she states her headaches are better because her husband who has brain cancer is actually doing better recently.  The patient is a current patient of Dr. Jannifer Franklin  who is out of the office today . This note is sent to the work in doctor.     Continue Imitrex and Maxalt acutely Continue tramadol as needed Try Zonegran 100mg  2 at hs for migraine prevention Percocet prescription given to patient reviewed [place] narcotic registry Continue Valium when necessary at bedtime prescribed by [facility] hosp F/U in 6 months  Dennie Bible, [facility], [facility], [facility] [address] [place], [place] [address] [phone]

## 2016-01-10 NOTE — Patient Instructions (Signed)
Continue Imitrex and Maxalt acutely Continue tramadol as needed Try Zonegran 100mg  2 at hs for migraine prevention Percocet prescription given to patient  Continue Valium when necessary at bedtime  F/U in 6 months

## 2016-01-10 NOTE — Telephone Encounter (Signed)
Yes you already have it

## 2016-01-10 NOTE — Telephone Encounter (Signed)
Received fax confirmation for valium to [facility].  I spoke with pt and let her know, she verbalized understanding.

## 2016-02-04 NOTE — Progress Notes (Signed)
  Personally participated in and made any corrections needed to history, physical, neuro exam,assessment and plan as stated above and agree with plan.   Amrit Erck, MD 

## 2016-05-13 ENCOUNTER — Other Ambulatory Visit: Payer: Self-pay | Admitting: Obstetrics & Gynecology

## 2016-05-13 DIAGNOSIS — Z1231 Encounter for screening mammogram for malignant neoplasm of breast: Secondary | ICD-10-CM

## 2016-05-14 ENCOUNTER — Other Ambulatory Visit: Payer: Self-pay | Admitting: *Deleted

## 2016-05-14 MED ORDER — TRAMADOL HCL 50 MG PO TABS: ORAL_TABLET | ORAL | 5 refills | Status: DC

## 2016-05-14 NOTE — Telephone Encounter (Signed)
Pt has appt 07-15-16

## 2016-05-15 ENCOUNTER — Ambulatory Visit (HOSPITAL_COMMUNITY)
Admission: RE | Admit: 2016-05-15 | Discharge: 2016-05-15 | Disposition: A | Discharge status: Home / Self Care | Payer: BLUE CROSS/BLUE SHIELD | Source: Ambulatory Visit | Attending: Obstetrics & Gynecology | Admitting: Obstetrics & Gynecology

## 2016-05-15 DIAGNOSIS — Z1231 Encounter for screening mammogram for malignant neoplasm of breast: Secondary | ICD-10-CM | POA: Diagnosis not present

## 2016-05-15 NOTE — Telephone Encounter (Signed)
Received fax confirmation ultram [phone]. sy

## 2016-07-15 ENCOUNTER — Encounter (INDEPENDENT_AMBULATORY_CARE_PROVIDER_SITE_OTHER): Payer: Self-pay

## 2016-07-15 ENCOUNTER — Encounter: Payer: Self-pay | Admitting: Nurse Practitioner

## 2016-07-15 ENCOUNTER — Ambulatory Visit (INDEPENDENT_AMBULATORY_CARE_PROVIDER_SITE_OTHER): Payer: BLUE CROSS/BLUE SHIELD | Admitting: Nurse Practitioner

## 2016-07-15 VITALS — BP 117/60 | HR 88 | Ht 66.0 in | Wt 171.6 lb

## 2016-07-15 DIAGNOSIS — F411 Generalized anxiety disorder: Secondary | ICD-10-CM

## 2016-07-15 DIAGNOSIS — G43019 Migraine without aura, intractable, without status migrainosus: Secondary | ICD-10-CM

## 2016-07-15 MED ORDER — DIAZEPAM 5 MG PO TABS: ORAL_TABLET | ORAL | 5 refills | Status: DC

## 2016-07-15 MED ORDER — SUMATRIPTAN SUCCINATE 100 MG PO TABS: ORAL_TABLET | ORAL | 6 refills | Status: DC

## 2016-07-15 MED ORDER — OXYCODONE-ACETAMINOPHEN 7.5-325 MG PO TABS: 1.0000 | ORAL_TABLET | Freq: Four times a day (QID) | ORAL | 0 refills | Status: DC | PRN

## 2016-07-15 MED ORDER — RIZATRIPTAN BENZOATE 10 MG PO TBDP: ORAL_TABLET | ORAL | 5 refills | Status: DC

## 2016-07-15 NOTE — Patient Instructions (Signed)
Continue Imitrex and Maxalt acutely Continue tramadol as needed Percocet prescription given to patient Continue Valium when necessary at bedtime  F/U in 6 months

## 2016-07-15 NOTE — Progress Notes (Signed)
I have read the note, and I agree with the clinical assessment and plan.  Kendrick Haapala KEITH   

## 2016-07-15 NOTE — Progress Notes (Signed)
[facility]  PATIENT: Michelle Holt DOB: [birth year]   REASON FOR VISIT: follow up for migraine, generalized anxiety disorder, back pain HISTORY FROM:patient    HISTORY OF PRESENT ILLNESS:UPDATE 07/15/2016 CM Michelle Holt, 46 year old female returns for follow-up with history of migraine headaches generalized anxiety disorder and back pain. She is having about 3-6 Headaches per month. She was tried on Zonegran at her last visit as a preventive but she had memory loss on the drug she claims. She uses Imitrex and Maxalt acutely with relief. She continues to work full-time. Her husband has brain cancer and has been given 3-6 months at most. She has a generalized anxiety disorder which she takes Valium. Her routine labs are followed by Michelle Holt. She uses Percocet for back pain. She only uses 40 pills in 6 months. She returns for reevaluation   UPDATE 12/27/2017CMMs. Holt, 46 year old female returns for follow-up. She has a history of migraines.Her triggers are stress and weather changes. She has been on multiple preventives in the past to include topamax nortriptyline and verapamil and tizanidine. She also has generalized anxiety disorder which she takes Valium. She uses Imitrex and Maxalt acutely She has found employment locally in [place] in [place] control at [employer]. Her husband who has brain cancer is actually doing better at present. She uses Percocet for back pain. Labs are done at PCP. She returns for reevaluation   UPDATE 01/10/15 CMMs Michelle Holt, 46 year old female returns for follow up. She has a history of migraine. She was last seen 09/30/14. She says today her headaches have been better the last few months. She has been on multiple preventives in the past most recently tizanidine which she took for a month and found no real benefit. She had nausea and diarrhea on the Topamax. Stress is a trigger for headaches. Her husband is diagnosed with lung cancer with  mets to the brain. She also complains of weight gain and snoring with morning headaches.She currently is not working .She does not have obstructive sleep apnea according to the sleep study. She is using both Imitrex and Maxalt melt acutely. She has stopped her Verapamil. She is not aware of any foods or other triggers except for changes in the weather. She has been on nortriptyline in the past and had significant weight gain. She also has low back pain. She has had facet joint injections in the past the last being 2.5 years ago. She has generalized anxiety disorder for which Valium has been beneficial. She returns for reevaluation. HISTORY:of migraine headache, muscle tension headache, and lumbosacral spondylosis. The patient has significant L5-S1 level facet joint arthritis that has responded in the past to injections. The patient recently was diagnosed with right-sided plantar fasciitis, and a heel spur. The patient received an injection for this, and she is doing somewhat better. The patient was placed on diclofenac for the plantar fasciitis. The patient has recently been under a lot of stress with an illness involving her mother. The patient had worsening of headaches, and the nortriptyline dose was increased to 70 mg at night. The patient has had an improvement in the daily headaches, but she continues to have occasional migraine. The patient takes Imitrex for migraine, with some benefit. The patient returns to this office for an evaluation.    REVIEW OF SYSTEMS: Full 14 system review of systems performed and notable only for those listed, all others are neg:  Constitutional: fatigue  Cardiovascular: neg Ear/Nose/Throat: neg  Skin: neg Eyes: neg Respiratory:  neg Gastroitestinal: neg  Hematology/Lymphatic: neg  Endocrine: neg Musculoskeletal: Back pain, migraine headaches Allergy/Immunology: neg Neurological: headache Psychiatric: Anxiety disorder Sleep : frequent  waking  ALLERGIES: Allergies  Allergen Reactions  . Actifed Cold-Allergy [Chlorpheniramine-Phenyleph Er] Rash  . Amoxicillin Rash  . Codeine Hives  . Erythromycin Rash  . Penicillins Rash  . Sudafed [Pseudoephedrine Hcl] Rash    HOME MEDICATIONS: Outpatient Medications Prior to Visit  Medication Sig Dispense Refill  . diazepam (VALIUM) 5 MG tablet TAKE 1 TABLET BY MOUTH AT BEDTIME AS NEEDED FOR ANXIETY 30 tablet 5  . levofloxacin (LEVAQUIN) 750 MG tablet     . oxyCODONE-acetaminophen (PERCOCET) 7.5-325 MG tablet Take 1 tablet by mouth every 6 (six) hours as needed. 40 tablet 0  . predniSONE (STERAPRED UNI-PAK 21 TAB) 5 MG (21) TBPK tablet     . promethazine (PHENERGAN) 25 MG tablet Take 1 tablet (25 mg total) by mouth every 6 (six) hours as needed. 30 tablet 6  . rizatriptan (MAXALT-MLT) 10 MG disintegrating tablet TAKE ONE TABLET BY MOUTH AS NEEDED FOR MIGRAINE, MAY REPEAT IN TWO HOURS IF NEEDED 10 tablet 5  . SUMAtriptan (IMITREX) 100 MG tablet TAKE ONE TABLET BY MOUTH PRN UP to TWICE DAILY AS NEEDED. 9 tablet 6  . traMADol (ULTRAM) 50 MG tablet TAKE ONE TABLET BY MOUTH EVERY 6 HOURS AS NEEDED. MUST LAST 28 DAYS. 50 tablet 5  . zonisamide (ZONEGRAN) 100 MG capsule Take 2 capsules (200 mg total) by mouth at bedtime. 60 capsule 6   No facility-administered medications prior to visit.     PAST MEDICAL HISTORY: Past Medical History:  Diagnosis Date  . Anxiety and depression   . Arthritis of facet joints at multiple vertebral levels (HCC)    L5-S1  . Constipation   . Dyslipidemia   . Insomnia   . Irritable bowel syndrome   . Migraine   . Muscle tension headache   . Plantar fasciitis of right foot     PAST SURGICAL HISTORY: Past Surgical History:  Procedure Laterality Date  . CHOLECYSTECTOMY  2008  . COLONOSCOPY N/A 08/13/2013   Procedure: COLONOSCOPY;  Surgeon: Michelle Houston, MD;  [facility];  Service: Endoscopy;  Laterality: N/A;  230-moved to 145 Ann to  notify pt  . ESOPHAGOGASTRODUODENOSCOPY    . TUBAL LIGATION Bilateral   . UTERINE ABLATION      FAMILY HISTORY: Family History  Problem Relation Age of Onset  . Hypertension Mother   . Obesity Mother   . Diabetes Mother   . Peripheral vascular disease Father   . COPD Brother   . Osteoporosis Brother   . Colon cancer Neg Hx     SOCIAL HISTORY: Social History   Social History  . Marital status: Married    Spouse name: N/A  . Number of children: 2  . Years of education: 2-College   Occupational History  .  [employer]   Social History Main Topics  . Smoking status: Current Every Day Smoker    Packs/day: 0.50    Years: 17.00    Types: Cigarettes    Last attempt to quit: 12/11/2014  . Smokeless tobacco: Never Used     Comment: started back September 2015   . Alcohol use 0.6 oz/week    1 Glasses of wine per week     Comment: Drinks alcohol on occasion  . Drug use: No  . Sexual activity: Not on file   Other Topics Concern  .  Not on file   Social History Narrative   Patient lives at home with her daughter.    Patient has 2 children.    Patient is Married.    Patient is right handed.    Patient has her Associates degree.           PHYSICAL EXAM  Vitals:   07/15/16 0732  BP: 117/60  Pulse: 88  Weight: 171 lb 9.6 oz (77.8 kg)  Height: 5\' 6"  (1.676 m)   Body mass index is 27.7 kg/m. Generalized: Well developed, obese female in no acute distress  Head: normocephalic and atraumatic,.  Neck: Supple, Musculoskeletal: No deformity  Neurological examination  Mentation: Alert oriented to time, place, history taking. Follows all commands speech and language fluent.  Cranial nerve II-XII: Pupils were equal round reactive to light extraocular movements were full, visual field were full on confrontational test. Facial sensation and strength were normal. hearing was intact to finger rubbing bilaterally. Uvula tongue midline. head turning and shoulder  shrug were normal and symmetric.Tongue protrusion into cheek strength was normal.  Motor: normal bulk and tone, full strength in the BUE, BLE, fine finger movements normal, no pronator drift. No focal weakness  Sensory: normal and symmetric to light touch,  Coordination: finger-nose-finger, heel-to-shin bilaterally, no dysmetria  Reflexes: Brachioradialis 2/2, biceps 2/2, triceps 2/2, patellar 2/2, Achilles 2/2, plantar responses were flexor bilaterally.  Gait and Station: Rising up from seated position without assistance, normal stance, moderate stride, good arm swing, smooth turning, able to perform tiptoe, and heel walking without difficulty. Tandem gait is steady  DIAGNOSTIC DATA (LABS, IMAGING, TESTING) - ASSESSMENT AND PLAN 46 y.o. year old female has a past medical history of Migraine; Muscle tension headache; Anxiety and depression; Dyslipidemia; Irritable bowel syndrome; Insomnia; Arthritis of facet joints at multiple vertebral levels; here to follow-up for her migraines and her back pain. Her migraines have not responded to Topamax or to tizanidine or Zonegran, , however she states her headaches are fairly well controlled at present she does not wish to go on another  preventive medication.    Continue Imitrex and Maxalt acutely Continue tramadol as needed Percocet prescription given to patient reviewed [place] narcotic registry  Continue Valium when necessary at bedtime  F/U in 6 months  Dennie Bible, [facility], [facility], [facility] [address], [place], [place] [address] [phone]

## 2016-08-12 ENCOUNTER — Telehealth: Payer: Self-pay | Admitting: *Deleted

## 2016-08-12 NOTE — Telephone Encounter (Signed)
Attempted to reach [facility] re: fax received for refill on Diazepam. Patient received refill x 6 months, [facility] [place] on [facility]/15/16. Will call [facility] later today.

## 2016-08-12 NOTE — Telephone Encounter (Signed)
Called [facility], spoke with Rich, pharmacist and advised him this office gave patient Diazepam refill x 6 months on 07/15/16, and the [facility] [place]. Pharmacist stated he would find out what patient "was doing". He verbalized appreciation for call.

## 2016-09-03 ENCOUNTER — Other Ambulatory Visit: Payer: Self-pay | Admitting: Neurology

## 2016-09-03 MED ORDER — OXYCODONE-ACETAMINOPHEN 7.5-325 MG PO TABS: 1.0000 | ORAL_TABLET | Freq: Four times a day (QID) | ORAL | 0 refills | Status: DC | PRN

## 2016-09-03 NOTE — Telephone Encounter (Signed)
LMVM for pt that is ready for p/u at front desk.

## 2016-09-03 NOTE — Telephone Encounter (Signed)
LMVM for pt that prescription ready for pick up, placed upfront.

## 2016-09-03 NOTE — Addendum Note (Signed)
Addended by: Brandon Melnick on: 09/03/2016 01:53 PM   Modules accepted: Orders

## 2016-09-03 NOTE — Telephone Encounter (Signed)
Patient called office requesting refill for oxyCODONE-acetaminophen (PERCOCET) 7.5-325 MG tablet.

## 2016-09-03 NOTE — Telephone Encounter (Signed)
Pt last seen in 07/2016 by CM/NP.  Has upcoming appt with Dr. Jannifer Franklin in 01/2017.

## 2016-09-19 ENCOUNTER — Telehealth: Payer: Self-pay | Admitting: *Deleted

## 2016-09-19 NOTE — Telephone Encounter (Signed)
Spoke with San Jetty, pharmacist at [facility] and advised her this RN spoke to Denice Paradise, pharmacist on 08/12/16 regarding refill request for Diazepam. Advised him that [facility] for 6 month's supply on 07/15/16. Cloyde Reams stated she would make a note, verbalized understanding, appreciation of call.

## 2016-09-23 ENCOUNTER — Other Ambulatory Visit: Payer: Self-pay | Admitting: Nurse Practitioner

## 2016-09-23 MED ORDER — OXYCODONE-ACETAMINOPHEN 7.5-325 MG PO TABS: 1.0000 | ORAL_TABLET | Freq: Four times a day (QID) | ORAL | 0 refills | Status: DC | PRN

## 2016-09-23 MED ORDER — DIAZEPAM 5 MG PO TABS: ORAL_TABLET | ORAL | 5 refills | Status: DC

## 2016-09-23 NOTE — Telephone Encounter (Signed)
Diazepam refill Rx faxed to [facility], [place], [place].

## 2016-09-23 NOTE — Telephone Encounter (Addendum)
Called [facility] [place] and spoke with Tania who stated they filled diazepam on July 14 and Aug 29, 2014. They have not filled or gotten any prescriptions for patient this year. The diazepam Rx was  transferred to  [facility] drug store, to [employer] following the 08/29/14 refill. Will route patient's request to NP.

## 2016-09-23 NOTE — Telephone Encounter (Signed)
RX on your desk.

## 2016-09-23 NOTE — Telephone Encounter (Signed)
Corin[facility] [phone] called in to advise the pt is requesting RX be sent to [facility]. Corrine said it cannot be transferred as it is a controlled drug. Please send

## 2016-12-26 ENCOUNTER — Telehealth: Payer: Self-pay | Admitting: Neurology

## 2016-12-26 MED ORDER — OXYCODONE-ACETAMINOPHEN 7.5-325 MG PO TABS: 1.0000 | ORAL_TABLET | Freq: Four times a day (QID) | ORAL | 0 refills | Status: DC | PRN

## 2016-12-26 NOTE — Telephone Encounter (Signed)
Pt request refill for oxyCODONE-acetaminophen (PERCOCET) 7.5-325 MG tablet . Pt is aware the clinic closes at noon tomorrow

## 2016-12-26 NOTE — Telephone Encounter (Signed)
The prescription for the oxycodone will be refilled.

## 2016-12-26 NOTE — Telephone Encounter (Signed)
Placed printed/signed rx up front for patient pick up.  

## 2017-01-26 ENCOUNTER — Telehealth: Payer: Self-pay | Admitting: *Deleted

## 2017-01-26 NOTE — Telephone Encounter (Signed)
Called pt. R/s appt for 1/14 at 8am to same day but later time at 12pm, check in 1130am d/t weather. Pt verbalized understanding and appreciation for call.

## 2017-01-27 ENCOUNTER — Ambulatory Visit: Payer: BLUE CROSS/BLUE SHIELD | Admitting: Neurology

## 2017-01-27 ENCOUNTER — Ambulatory Visit (INDEPENDENT_AMBULATORY_CARE_PROVIDER_SITE_OTHER): Payer: BLUE CROSS/BLUE SHIELD | Admitting: Neurology

## 2017-01-27 ENCOUNTER — Encounter: Payer: Self-pay | Admitting: Neurology

## 2017-01-27 VITALS — BP 117/74 | HR 82 | Ht 66.0 in | Wt 166.5 lb

## 2017-01-27 DIAGNOSIS — G43019 Migraine without aura, intractable, without status migrainosus: Secondary | ICD-10-CM | POA: Diagnosis not present

## 2017-01-27 MED ORDER — SUMATRIPTAN SUCCINATE 100 MG PO TABS: ORAL_TABLET | ORAL | 6 refills | Status: DC

## 2017-01-27 MED ORDER — OXYCODONE-ACETAMINOPHEN 7.5-325 MG PO TABS: 1.0000 | ORAL_TABLET | Freq: Four times a day (QID) | ORAL | 0 refills | Status: DC | PRN

## 2017-01-27 MED ORDER — RIZATRIPTAN BENZOATE 10 MG PO TBDP: ORAL_TABLET | ORAL | 5 refills | Status: DC

## 2017-01-27 MED ORDER — FREMANEZUMAB-VFRM 225 MG/1.5ML ~~LOC~~ SOSY: 225.0000 mg | PREFILLED_SYRINGE | SUBCUTANEOUS | 3 refills | Status: DC

## 2017-01-27 NOTE — Patient Instructions (Signed)
   We will try Ajovy for the headache.

## 2017-01-27 NOTE — Progress Notes (Signed)
Reason for visit: Migraine headache  Michelle Holt is an 47 y.o. female  History of present illness:  Michelle Holt is a 47 year old right-handed white female with a history of intractable migraine headache.  The patient has been under a lot of stress recently as her husband passed away on 2016-12-02.  The patient is in grief counseling currently.  She is not working at this time, she plans on going back to work in February 2019.  The patient indicates that her headache frequency has increased, the patient is having 12-15 headache days a month.  Six to eight of these headache days are severe and are incapacitating to her.  The patient takes Maxalt and Imitrex for her headache if needed.  The patient has been on a multitude of medications in the past for migraine without benefit or without tolerance.  She has been on Topamax, Zonegran, propranolol, nortriptyline, verapamil, and she has had Botox injections without benefit.  She returns to this office for an evaluation.  She indicates that weather changes and strong odors may bring on headache.   Past Medical History:  Diagnosis Date  . Anxiety and depression   . Arthritis of facet joints at multiple vertebral levels (HCC)    L5-S1  . Constipation   . Dyslipidemia   . Insomnia   . Irritable bowel syndrome   . Migraine   . Muscle tension headache   . Plantar fasciitis of right foot     Past Surgical History:  Procedure Laterality Date  . CHOLECYSTECTOMY  2008  . COLONOSCOPY N/A 08/13/2013   Procedure: COLONOSCOPY;  Surgeon: Rogene Houston, MD;  [facility];  Service: Endoscopy;  Laterality: N/A;  230-moved to 145 Ann to notify pt  . ESOPHAGOGASTRODUODENOSCOPY    . TUBAL LIGATION Bilateral   . UTERINE ABLATION      Family History  Problem Relation Age of Onset  . Hypertension Mother   . Obesity Mother   . Diabetes Mother   . Peripheral vascular disease Father   . COPD Brother   . Osteoporosis Brother   .  Colon cancer Neg Hx     Social history:  reports that she has been smoking cigarettes.  She has a 8.50 pack-year smoking history. she has never used smokeless tobacco. She reports that she drinks about 0.6 oz of alcohol per week. She reports that she does not use drugs.    Allergies  Allergen Reactions  . Actifed Cold-Allergy [Chlorpheniramine-Phenyleph Er] Rash  . Amoxicillin Rash  . Codeine Hives  . Erythromycin Rash  . Penicillins Rash  . Sudafed [Pseudoephedrine Hcl] Rash    Medications:  Prior to Admission medications   Medication Sig Start Date End Date Taking? Authorizing Provider  diazepam (VALIUM) 5 MG tablet TAKE 1 TABLET BY MOUTH AT BEDTIME AS NEEDED FOR ANXIETY 09/23/16  Yes Dennie Bible, NP  oxyCODONE-acetaminophen (PERCOCET) 7.5-325 MG tablet Take 1 tablet by mouth every 6 (six) hours as needed. 12/26/16  Yes Kathrynn Ducking, MD  promethazine (PHENERGAN) 25 MG tablet Take 1 tablet (25 mg total) by mouth every 6 (six) hours as needed. 10/20/15  Yes Dennie Bible, NP  rizatriptan (MAXALT-MLT) 10 MG disintegrating tablet TAKE ONE TABLET BY MOUTH AS NEEDED FOR MIGRAINE, MAY REPEAT IN TWO HOURS IF NEEDED 07/15/16  Yes Dennie Bible, NP  SUMAtriptan (IMITREX) 100 MG tablet TAKE ONE TABLET BY MOUTH PRN UP to TWICE DAILY AS NEEDED. 07/15/16  Yes  Dennie Bible, NP  traMADol (ULTRAM) 50 MG tablet TAKE ONE TABLET BY MOUTH EVERY 6 HOURS AS NEEDED. MUST LAST 28 DAYS. 05/14/16  Yes Dennie Bible, NP    ROS:  Out of a complete 14 system review of symptoms, the patient complains only of the following symptoms, and all other reviewed systems are negative.  Headache Frequent waking Agitation, confusion, decreased concentration, depression, anxiety  Blood pressure 117/74, pulse 82, height 5\' 6"  (1.676 m), weight 166 lb 8 oz (75.5 kg).  Physical Exam  General: The patient is alert and cooperative at the time of the examination.  Skin: No  significant peripheral edema is noted.   Neurologic Exam  Mental status: The patient is alert and oriented x 3 at the time of the examination. The patient has apparent normal recent and remote memory, with an apparently normal attention span and concentration ability.   Cranial nerves: Facial symmetry is present. Speech is normal, no aphasia or dysarthria is noted. Extraocular movements are full. Visual fields are full.  Motor: The patient has good strength in all 4 extremities.  Sensory examination: Soft touch sensation is symmetric on the face, arms, and legs.  Coordination: The patient has good finger-nose-finger and heel-to-shin bilaterally.  Gait and station: The patient has a normal gait. Tandem gait is normal. Romberg is negative. No drift is seen.  Reflexes: Deep tendon reflexes are symmetric.   Assessment/Plan:  1.  Intractable migraine headache  2.  Anxiety disorder  The patient is having increased headache associated with the stress of the loss of her husband.  The patient has not responded well to various medications for migraine in the past.  She will be given a trial on Ajovy.  The patient will follow-up in 6 months, sooner if needed.  She will call for any dose adjustments of her medications.  A prescription was given for her oxycodone, and for Imitrex and Maxalt.  She takes diazepam for anxiety.  Jill Alexanders MD 01/27/2017 12:12 PM  [facility] [address] [place],  [address]  Phone [phone] Fax [phone]

## 2017-01-28 ENCOUNTER — Telehealth: Payer: Self-pay | Admitting: *Deleted

## 2017-01-28 NOTE — Telephone Encounter (Signed)
Submitted PA Ajovy to covermymeds. Key: [id number]. Waiting on determination from insurance.

## 2017-01-29 MED ORDER — ERENUMAB-AOOE 70 MG/ML ~~LOC~~ SOAJ: 140.0000 mg | SUBCUTANEOUS | 3 refills | Status: DC

## 2017-01-29 NOTE — Telephone Encounter (Signed)
Michelle Holt is not covered through insurance, we will switch to Aimovig.  I called the patient.  We will switch to Aimovig, not use Ajovy.

## 2017-01-29 NOTE — Addendum Note (Signed)
Addended by: Kathrynn Ducking on: 01/29/2017 08:24 AM   Modules accepted: Orders

## 2017-01-29 NOTE — Telephone Encounter (Signed)
Received fax notification from [facility] that Ajovy is not part of patient's prescription drug plan. It is a non preferred drug.  Coverage alternatives (with a prior auth) may include: Aimovig and Emgality.

## 2017-01-29 NOTE — Telephone Encounter (Signed)
Submitted PA Aimovig on covermymeds. Key: [id number]. Waiting on determination.

## 2017-01-31 NOTE — Telephone Encounter (Signed)
Received fax notification from [place], [place] Aimovig autoinjector (2 pack) approved effective 01/16/17-01/13/2098. Contract #: [id number], Case ID: [id number].  Pharmacy help desk #: [phone].   Faxed notice of approval to [employer]	[facility] at [phone]. Received fax confirmation.

## 2017-02-11 ENCOUNTER — Encounter: Payer: Self-pay | Admitting: Neurology

## 2017-02-25 ENCOUNTER — Telehealth: Payer: Self-pay | Admitting: *Deleted

## 2017-02-25 MED ORDER — DIAZEPAM 5 MG PO TABS: ORAL_TABLET | ORAL | 5 refills | Status: DC

## 2017-02-25 NOTE — Telephone Encounter (Signed)
Faxed printed/signed rx diazepam to [facility] at [phone]. Received fax confirmation.

## 2017-02-25 NOTE — Telephone Encounter (Signed)
Sign prescription for diazepam.

## 2017-02-25 NOTE — Telephone Encounter (Signed)
A prescription was given for diazepam today.

## 2017-03-26 ENCOUNTER — Other Ambulatory Visit: Payer: Self-pay | Admitting: Neurology

## 2017-03-26 MED ORDER — OXYCODONE-ACETAMINOPHEN 7.5-325 MG PO TABS: 1.0000 | ORAL_TABLET | Freq: Four times a day (QID) | ORAL | 0 refills | Status: DC | PRN

## 2017-03-26 NOTE — Telephone Encounter (Signed)
The oxycodone will be refilled, the [facility] registry was checked.

## 2017-03-26 NOTE — Telephone Encounter (Signed)
Called pt. Advised Dr. Jannifer Franklin refilled and can now send directly to pharmacy. No need to pick up printed rx. She verbalized understanding and appreciation for call.

## 2017-03-26 NOTE — Telephone Encounter (Signed)
Pt request refill for oxyCODONE-acetaminophen (PERCOCET) 7.5-325 MG tablet

## 2017-05-27 ENCOUNTER — Telehealth: Payer: Self-pay | Admitting: Neurology

## 2017-05-27 MED ORDER — ERENUMAB-AOOE 140 MG/ML ~~LOC~~ SOAJ: 140.0000 mg | SUBCUTANEOUS | 5 refills | Status: DC

## 2017-05-27 MED ORDER — OXYCODONE-ACETAMINOPHEN 7.5-325 MG PO TABS: 1.0000 | ORAL_TABLET | Freq: Four times a day (QID) | ORAL | 0 refills | Status: DC | PRN

## 2017-05-27 NOTE — Telephone Encounter (Signed)
I called Michelle Holt back. She stated Aimovig no longer comes in 2 pack of 70mg  to equal 140mg  dose. Requested new rx sent for 140mg  (1pen/month) to update her script on file.

## 2017-05-27 NOTE — Telephone Encounter (Signed)
Michelle Holt with [facility] has questions about dosage of Erenumab-aooe (AIMOVIG 140 DOSE) 70 MG/ML SOAJ.

## 2017-05-27 NOTE — Telephone Encounter (Signed)
The [facility] registry was checked, the prescription for oxycodone will be sent in.

## 2017-05-27 NOTE — Addendum Note (Signed)
Addended by: Kathrynn Ducking on: 05/27/2017 01:31 PM   Modules accepted: Orders

## 2017-05-27 NOTE — Telephone Encounter (Signed)
I sent in a prescription for the 140 mg Aimovig syringe.

## 2017-05-27 NOTE — Telephone Encounter (Signed)
Patient requesting refill of oxyCODONE-acetaminophen (PERCOCET) 7.5-325 MG tablet sent to [facility] in [place].

## 2017-05-27 NOTE — Addendum Note (Signed)
Addended by: Kathrynn Ducking on: 05/27/2017 01:28 PM   Modules accepted: Orders

## 2017-05-28 ENCOUNTER — Telehealth: Payer: Self-pay | Admitting: *Deleted

## 2017-05-28 ENCOUNTER — Encounter: Payer: Self-pay | Admitting: *Deleted

## 2017-05-28 MED ORDER — DIVALPROEX SODIUM 500 MG PO DR TAB: 500.0000 mg | DELAYED_RELEASE_TABLET | Freq: Two times a day (BID) | ORAL | 3 refills | Status: DC

## 2017-05-28 NOTE — Telephone Encounter (Signed)
The patient has not gotten any benefit from [place], she has been on Botox before, she has been tried on a multitude of other medications, we will give a trial on Depakote to see if this is helpful.  If this does not help, we may try Seroquel.  The patient will start on 500 mg of Depakote for 2 weeks and then go to 1 twice daily.

## 2017-05-28 NOTE — Telephone Encounter (Signed)
I called pt. Advised rx aimovig switched to 140mg  dose (1 pen/month) since they do not carry pack with 2-70mg  pens anymore and requiring another PA. Pt does not feel Aimovig helping with migraines. Migraines have increased since starting it. She states she has been under a lot of stress lately. Her husband passed 11/2016 and father recently passed 03/2017. She had a migraine today which she took imitrex and it improved.  She also was concerned after taking aimovig. She would get a red bullseye where she would do inj that was about 2 inch. She noted some swelling also. This would last the rest of the day and clear up by the next day. She is wondering what next steps should be. Advised I will message Dr. Jannifer Franklin to advise before moving forward with PA w/ her insurance. We will call her back.   Her follow up r/s from 08/22/17 to 08/11/17 for her 6 month follow up. Her original f/u on 07/28/17 was cx since Dr. Jannifer Franklin out of office then.

## 2017-05-29 NOTE — Telephone Encounter (Signed)
I called [facility] at [phone] and d/c'd rx Aimovig and advised therapy changed. They updated pt records. Nothing further needed.

## 2017-06-23 ENCOUNTER — Other Ambulatory Visit: Payer: Self-pay | Admitting: Obstetrics & Gynecology

## 2017-06-23 DIAGNOSIS — Z1231 Encounter for screening mammogram for malignant neoplasm of breast: Secondary | ICD-10-CM

## 2017-06-25 ENCOUNTER — Ambulatory Visit (HOSPITAL_COMMUNITY): Payer: BLUE CROSS/BLUE SHIELD

## 2017-07-09 ENCOUNTER — Ambulatory Visit (HOSPITAL_COMMUNITY)
Admission: RE | Admit: 2017-07-09 | Discharge: 2017-07-09 | Disposition: A | Discharge status: Home / Self Care | Payer: BLUE CROSS/BLUE SHIELD | Source: Ambulatory Visit | Attending: Obstetrics & Gynecology | Admitting: Obstetrics & Gynecology

## 2017-07-09 DIAGNOSIS — Z1231 Encounter for screening mammogram for malignant neoplasm of breast: Secondary | ICD-10-CM | POA: Diagnosis present

## 2017-07-28 ENCOUNTER — Ambulatory Visit: Payer: BLUE CROSS/BLUE SHIELD | Admitting: Neurology

## 2017-07-31 ENCOUNTER — Other Ambulatory Visit: Payer: Self-pay | Admitting: Nurse Practitioner

## 2017-08-11 ENCOUNTER — Ambulatory Visit (INDEPENDENT_AMBULATORY_CARE_PROVIDER_SITE_OTHER): Payer: BLUE CROSS/BLUE SHIELD | Admitting: Neurology

## 2017-08-11 ENCOUNTER — Encounter: Payer: Self-pay | Admitting: Neurology

## 2017-08-11 VITALS — BP 92/58 | HR 80 | Ht 66.0 in | Wt 168.0 lb

## 2017-08-11 DIAGNOSIS — G43019 Migraine without aura, intractable, without status migrainosus: Secondary | ICD-10-CM | POA: Diagnosis not present

## 2017-08-11 MED ORDER — OXYCODONE-ACETAMINOPHEN 7.5-325 MG PO TABS: 1.0000 | ORAL_TABLET | Freq: Four times a day (QID) | ORAL | 0 refills | Status: DC | PRN

## 2017-08-11 MED ORDER — VENLAFAXINE HCL ER 37.5 MG PO CP24
ORAL_CAPSULE | ORAL | 2 refills | Status: DC
Start: 2017-08-11 — End: 2018-02-11

## 2017-08-11 MED ORDER — RIZATRIPTAN BENZOATE 10 MG PO TBDP: ORAL_TABLET | ORAL | 5 refills | Status: DC

## 2017-08-11 MED ORDER — IBUPROFEN 600 MG PO TABS: 600.0000 mg | ORAL_TABLET | Freq: Four times a day (QID) | ORAL | 3 refills | Status: DC | PRN

## 2017-08-11 NOTE — Progress Notes (Signed)
Reason for visit: Migraine headache  Michelle Holt is an 47 y.o. female  History of present illness:  Ms. Maiorino is a 47 year old right-handed white female with a history of intractable migraine headaches.  The patient continues to have problems with anxiety and depression.  She lost her husband in the fall 04/10/2016, her father died in 04-10-17.  The patient has missed quite a bit of work because of this.  She is currently seeing a therapist.  She is on Depakote taking 500 mg twice daily for her migraine, she tolerates this fairly well.  She has had recent blood work done through her primary care physician that included a CBC and a liver profile.  The patient was told that these studies were unremarkable.  The patient does have a history of a fatty liver.  The patient uses both Maxalt and Imitrex.  She takes Maxalt if she gets a headache during the day and Imitrex if she wakes up with a headache.  The patient has been given a trial on Aimovig without benefit.  She has been on Botox in the past as well.  She recently has been treated for a rash of some sort on the palms of her hands.  The patient is on average missing about 2 days of work a month.  She will have 1-2 headaches a week.  She has noted that 600 mg ibuprofen tablets are helpful.  She occasionally may take an oxycodone tablet for her headache.  She returns for an evaluation.  Past Medical History:  Diagnosis Date  . Anxiety and depression   . Arthritis of facet joints at multiple vertebral levels    L5-S1  . Constipation   . Dyslipidemia   . Insomnia   . Irritable bowel syndrome   . Migraine   . Muscle tension headache   . Plantar fasciitis of right foot     Past Surgical History:  Procedure Laterality Date  . CHOLECYSTECTOMY  2006/04/11  . COLONOSCOPY N/A 08/13/2013   Procedure: COLONOSCOPY;  Surgeon: Rogene Houston, MD;  [facility];  Service: Endoscopy;  Laterality: N/A;  230-moved to 145 Ann to notify pt  .  ESOPHAGOGASTRODUODENOSCOPY    . TUBAL LIGATION Bilateral   . UTERINE ABLATION      Family History  Problem Relation Age of Onset  . Hypertension Mother   . Obesity Mother   . Diabetes Mother   . Peripheral vascular disease Father   . COPD Brother   . Osteoporosis Brother   . Colon cancer Neg Hx     Social history:  reports that she has been smoking cigarettes.  She has a 8.50 pack-year smoking history. She has never used smokeless tobacco. She reports that she drinks about 0.6 oz of alcohol per week. She reports that she does not use drugs.    Allergies  Allergen Reactions  . Nortriptyline Other (See Comments)    Significant weight gain  . Topamax [Topiramate] Diarrhea    nausea  . Actifed Cold-Allergy [Chlorpheniramine-Phenyleph Er] Rash  . Amoxicillin Rash  . Codeine Hives  . Erythromycin Rash  . Penicillins Rash  . Sudafed [Pseudoephedrine Hcl] Rash    Medications:  Prior to Admission medications   Medication Sig Start Date End Date Taking? Authorizing Provider  diazepam (VALIUM) 5 MG tablet TAKE 1 TABLET BY MOUTH AT BEDTIME AS NEEDED FOR ANXIETY 02/25/17  Yes Kathrynn Ducking, MD  divalproex (DEPAKOTE) 500 MG DR tablet Take 1  tablet (500 mg total) by mouth 2 (two) times daily. 05/28/17  Yes Kathrynn Ducking, MD  oxyCODONE-acetaminophen (PERCOCET) 7.5-325 MG tablet Take 1 tablet by mouth every 6 (six) hours as needed. 08/11/17  Yes Kathrynn Ducking, MD  promethazine (PHENERGAN) 25 MG tablet TAKE ONE TABLET BY MOUTH EVERY 6 HOURS AS NEEDED. 07/31/17  Yes Kathrynn Ducking, MD  rizatriptan (MAXALT-MLT) 10 MG disintegrating tablet TAKE ONE TABLET BY MOUTH AS NEEDED FOR MIGRAINE, MAY REPEAT IN TWO HOURS IF NEEDED 08/11/17  Yes Kathrynn Ducking, MD  SUMAtriptan (IMITREX) 100 MG tablet TAKE ONE TABLET BY MOUTH PRN UP to TWICE DAILY AS NEEDED. 01/27/17  Yes Kathrynn Ducking, MD  venlafaxine XR (EFFEXOR XR) 37.5 MG 24 hr capsule One tablet daily for 1 week, then take 2 a day  08/11/17   Kathrynn Ducking, MD    ROS:  Out of a complete 14 system review of symptoms, the patient complains only of the following symptoms, and all other reviewed systems are negative.  Fatigue Memory loss, headache Agitation, decreased concentration, depression, anxiety  Blood pressure (!) 92/58, pulse 80, height 5\' 6"  (1.676 m), weight 168 lb (76.2 kg).  Physical Exam  General: The patient is alert and cooperative at the time of the examination.  The patient is minimally obese.  Skin: No significant peripheral edema is noted.   Neurologic Exam  Mental status: The patient is alert and oriented x 3 at the time of the examination. The patient has apparent normal recent and remote memory, with an apparently normal attention span and concentration ability.   Cranial nerves: Facial symmetry is present. Speech is normal, no aphasia or dysarthria is noted. Extraocular movements are full. Visual fields are full.  Motor: The patient has good strength in all 4 extremities.  Sensory examination: Soft touch sensation is symmetric on the face, arms, and legs.  Coordination: The patient has good finger-nose-finger and heel-to-shin bilaterally.  Gait and station: The patient has a normal gait. Tandem gait is normal. Romberg is negative. No drift is seen.  Reflexes: Deep tendon reflexes are symmetric.   Assessment/Plan:  1.  Intractable migraine headache  2.  Anxiety and depression  The patient is having a lot of difficulty managing stress in her life.  She is seeing a therapist which is good.  The patient may benefit from an antidepressant medication, SSRI antidepressants in the past have worsened depression.  The patient will be started on Effexor hopefully help treat her headache as well as depression.  She will be worked up to a 75 mg dosing daily, she will call for any dose adjustments.  A prescription will be sent in for ibuprofen taking 600 mg if needed.  A prescription was  given for oxycodone and for her Maxalt.  She will follow-up in 6 months.  Jill Alexanders MD 08/11/2017 7:42 AM  [facility] [address] [place], [place] [address]  Phone [phone] Fax [phone]

## 2017-08-11 NOTE — Patient Instructions (Signed)
We will start Effexor 37.5 mg daily for 1 week, then start 75 mg daily.

## 2017-08-22 ENCOUNTER — Ambulatory Visit: Payer: BLUE CROSS/BLUE SHIELD | Admitting: Neurology

## 2017-08-30 ENCOUNTER — Other Ambulatory Visit: Payer: Self-pay | Admitting: Neurology

## 2017-10-06 ENCOUNTER — Other Ambulatory Visit: Payer: Self-pay | Admitting: Neurology

## 2017-10-06 MED ORDER — OXYCODONE-ACETAMINOPHEN 7.5-325 MG PO TABS: 1.0000 | ORAL_TABLET | Freq: Four times a day (QID) | ORAL | 0 refills | Status: DC | PRN

## 2017-10-06 NOTE — Telephone Encounter (Signed)
Pt has called for a refill on her oxyCODONE-acetaminophen (PERCOCET) 7.5-325 MG tablet please send to  [facility], [place], [place] [phone] (Phone) [phone] (Fax)

## 2017-10-06 NOTE — Addendum Note (Signed)
Addended by: Hope Pigeon on: 10/06/2017 09:08 AM   Modules accepted: Orders

## 2017-12-02 ENCOUNTER — Telehealth: Payer: Self-pay | Admitting: Neurology

## 2017-12-02 MED ORDER — OXYCODONE-ACETAMINOPHEN 7.5-325 MG PO TABS: 1.0000 | ORAL_TABLET | Freq: Four times a day (QID) | ORAL | 0 refills | Status: DC | PRN

## 2017-12-02 NOTE — Telephone Encounter (Signed)
Pts requesting refills for oxyCODONE-acetaminophen (PERCOCET) 7.5-325 MG tablet sent to [facility]

## 2017-12-02 NOTE — Telephone Encounter (Signed)
The [facility] registry was checked, the prescription for oxycodone will be refilled.

## 2018-01-05 ENCOUNTER — Other Ambulatory Visit: Payer: Self-pay | Admitting: Neurology

## 2018-01-05 MED ORDER — OXYCODONE-ACETAMINOPHEN 7.5-325 MG PO TABS: 1.0000 | ORAL_TABLET | Freq: Four times a day (QID) | ORAL | 0 refills | Status: DC | PRN

## 2018-01-05 NOTE — Telephone Encounter (Signed)
Drug registry verified. Last refill was 12/02/2017 # 40 for a 10 day supply given by our office. Last ov 08/11/2017 and next o/v is 02/11/2017

## 2018-01-05 NOTE — Telephone Encounter (Signed)
Pt requesting refills for oxyCODONE-acetaminophen (PERCOCET) 7.5-325 MG tablet sent to [facility]

## 2018-02-11 ENCOUNTER — Ambulatory Visit (INDEPENDENT_AMBULATORY_CARE_PROVIDER_SITE_OTHER): Payer: Self-pay | Admitting: Neurology

## 2018-02-11 ENCOUNTER — Other Ambulatory Visit: Payer: Self-pay

## 2018-02-11 ENCOUNTER — Encounter: Payer: Self-pay | Admitting: Neurology

## 2018-02-11 VITALS — BP 118/56 | HR 80 | Ht 66.0 in | Wt 164.0 lb

## 2018-02-11 DIAGNOSIS — G43019 Migraine without aura, intractable, without status migrainosus: Secondary | ICD-10-CM

## 2018-02-11 MED ORDER — PROMETHAZINE HCL 25 MG PO TABS: 25.0000 mg | ORAL_TABLET | Freq: Four times a day (QID) | ORAL | 5 refills | Status: DC | PRN

## 2018-02-11 MED ORDER — SUMATRIPTAN SUCCINATE 100 MG PO TABS: ORAL_TABLET | ORAL | 5 refills | Status: DC

## 2018-02-11 MED ORDER — OXYCODONE-ACETAMINOPHEN 7.5-325 MG PO TABS: 1.0000 | ORAL_TABLET | Freq: Four times a day (QID) | ORAL | 0 refills | Status: DC | PRN

## 2018-02-11 MED ORDER — DIAZEPAM 5 MG PO TABS: ORAL_TABLET | ORAL | 5 refills | Status: DC

## 2018-02-11 NOTE — Progress Notes (Signed)
Reason for visit: Migraine headache  Michelle Holt is an 48 y.o. female  History of present illness:  Michelle Holt is a 48 year old right-handed white female with a history of intractable migraine headache.  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  She has in the past not tolerated Botox due to increased headaches and facial swelling.  The patient did not gain benefit from [place].  She was recently placed on Effexor, this did not help, she has gone off of her Depakote.  The reduced stress from her work has improved her headache frequency.  The patient has had some left hip discomfort with weightbearing over the last 18 months, the pain radiates into the left groin area.  She feels better with sitting or lying down.  The patient is using Imitrex and the Phenergan if needed.  She returns to this office for an evaluation.  Past Medical History:  Diagnosis Date  . Anxiety and depression   . Arthritis of facet joints at multiple vertebral levels    L5-S1  . Constipation   . Dyslipidemia   . Insomnia   . Irritable bowel syndrome   . Migraine   . Muscle tension headache   . Plantar fasciitis of right foot     Past Surgical History:  Procedure Laterality Date  . CHOLECYSTECTOMY  2008  . COLONOSCOPY N/A 08/13/2013   Procedure: COLONOSCOPY;  Surgeon: Rogene Houston, MD;  [facility];  Service: Endoscopy;  Laterality: N/A;  230-moved to 145 Ann to notify pt  . ESOPHAGOGASTRODUODENOSCOPY    . TUBAL LIGATION Bilateral   . UTERINE ABLATION      Family History  Problem Relation Age of Onset  . Hypertension Mother   . Obesity Mother   . Diabetes Mother   . Peripheral vascular disease Father   . COPD Brother   . Osteoporosis Brother   . Colon cancer Neg Hx     Social history:  reports that she has been smoking cigarettes. She has a  8.50 pack-year smoking history. She has never used smokeless tobacco. She reports current alcohol use of about 1.0 standard drinks of alcohol per week. She reports that she does not use drugs.    Allergies  Allergen Reactions  . Nortriptyline Other (See Comments)    Significant weight gain  . Topamax [Topiramate] Diarrhea    nausea  . Actifed Cold-Allergy [Chlorpheniramine-Phenyleph Er] Rash  . Amoxicillin Rash  . Codeine Hives  . Erythromycin Rash  . Penicillins Rash  . Sudafed [Pseudoephedrine Hcl] Rash    Medications:  Prior to Admission medications   Medication Sig Start Date End Date Taking? Authorizing Provider  diazepam (VALIUM) 5 MG tablet TAKE ONE TABLET BY MOUTH AT BEDTIME AS NEEDED FOR ANXIETY 09/01/17  Yes Kathrynn Ducking, MD  ibuprofen (ADVIL,MOTRIN) 600 MG tablet Take 1 tablet (600 mg total) by mouth every 6 (six) hours as needed. 08/11/17  Yes Kathrynn Ducking, MD  oxyCODONE-acetaminophen (PERCOCET) 7.5-325 MG tablet Take 1 tablet by mouth every 6 (six) hours as needed. 01/05/18  Yes Marcial Pacas, MD  promethazine (PHENERGAN) 25 MG tablet TAKE ONE TABLET BY MOUTH EVERY 6 HOURS AS NEEDED. 07/31/17  Yes Kathrynn Ducking, MD  SUMAtriptan (IMITREX) 100 MG tablet TAKE ONE TABLET BY MOUTH PRN UP to TWICE  DAILY AS NEEDED. 01/27/17  Yes Kathrynn Ducking, MD    ROS:  Out of a complete 14 system review of symptoms, the patient complains only of the following symptoms, and all other reviewed systems are negative.  Snoring  Blood pressure (!) 118/56, pulse 80, height 5\' 6"  (1.676 m), weight 164 lb (74.4 kg).  Physical Exam  General: The patient is alert and cooperative at the time of the examination.  Neuromuscular: The patient has significant pain in the left hip with internal rotation of the femur, not with external rotation.  Skin: No significant peripheral edema is noted.   Neurologic Exam  Mental status: The patient is alert and oriented x 3 at the time of the  examination. The patient has apparent normal recent and remote memory, with an apparently normal attention span and concentration ability.   Cranial nerves: Facial symmetry is present. Speech is normal, no aphasia or dysarthria is noted. Extraocular movements are full. Visual fields are full.  Motor: The patient has good strength in all 4 extremities.  Sensory examination: Soft touch sensation is symmetric on the face, arms, and legs.  Coordination: The patient has good finger-nose-finger and heel-to-shin bilaterally.  Gait and station: The patient has a normal gait. Tandem gait is normal. Romberg is negative. No drift is seen.  Reflexes: Deep tendon reflexes are symmetric.   Assessment/Plan:  1.  Intractable migraine headache  2.  Left hip pain, likely degenerative arthritis  The patient currently is not on any daily preventative medication.  Prescriptions were given for her oxycodone, diazepam, Phenergan, and Imitrex.  She will follow-up in 6 months.  Jill Alexanders MD 02/11/2018 8:00 AM  [facility] [address] [place], [place] [address]  Phone [phone] Fax [phone]

## 2018-03-16 ENCOUNTER — Other Ambulatory Visit: Payer: Self-pay | Admitting: Neurology

## 2018-04-28 ENCOUNTER — Telehealth: Payer: Self-pay | Admitting: Neurology

## 2018-04-28 NOTE — Telephone Encounter (Signed)
Pt called in and requested her oxyCODONE-acetaminophen (PERCOCET) 7.5-325 MG tablet to be refilled and sent to [facility], [place]

## 2018-04-29 MED ORDER — OXYCODONE-ACETAMINOPHEN 7.5-325 MG PO TABS: 1.0000 | ORAL_TABLET | Freq: Four times a day (QID) | ORAL | 0 refills | Status: DC | PRN

## 2018-04-29 NOTE — Telephone Encounter (Signed)
[name] drug registry verified.  Last refill was 02/11/18 # 40 for a 10 day supply provided by Dr. Jannifer Franklin. Patient has been compliant with her f/u's.

## 2018-04-29 NOTE — Telephone Encounter (Signed)
The oxycodone prescription will be refilled.

## 2018-04-29 NOTE — Addendum Note (Signed)
Addended by: Kathrynn Ducking on: 04/29/2018 09:08 AM   Modules accepted: Orders

## 2018-05-16 ENCOUNTER — Other Ambulatory Visit: Payer: Self-pay | Admitting: Neurology

## 2018-07-06 ENCOUNTER — Other Ambulatory Visit: Payer: Self-pay

## 2018-07-06 ENCOUNTER — Emergency Department: Payer: Self-pay

## 2018-07-06 ENCOUNTER — Telehealth: Payer: Self-pay | Admitting: Neurology

## 2018-07-06 ENCOUNTER — Inpatient Hospital Stay (HOSPITAL_COMMUNITY)
Admission: EM | Admit: 2018-07-06 | Discharge: 2018-07-16 | DRG: 166 | Disposition: A | Discharge status: Home Health | Payer: Medicaid Other | Attending: Internal Medicine | Admitting: Internal Medicine

## 2018-07-06 ENCOUNTER — Encounter (HOSPITAL_COMMUNITY): Payer: Self-pay

## 2018-07-06 DIAGNOSIS — J918 Pleural effusion in other conditions classified elsewhere: Secondary | ICD-10-CM | POA: Diagnosis present

## 2018-07-06 DIAGNOSIS — G43909 Migraine, unspecified, not intractable, without status migrainosus: Secondary | ICD-10-CM | POA: Diagnosis present

## 2018-07-06 DIAGNOSIS — M549 Dorsalgia, unspecified: Secondary | ICD-10-CM | POA: Diagnosis present

## 2018-07-06 DIAGNOSIS — R079 Chest pain, unspecified: Secondary | ICD-10-CM

## 2018-07-06 DIAGNOSIS — J90 Pleural effusion, not elsewhere classified: Secondary | ICD-10-CM

## 2018-07-06 DIAGNOSIS — Z881 Allergy status to other antibiotic agents status: Secondary | ICD-10-CM

## 2018-07-06 DIAGNOSIS — J942 Hemothorax: Secondary | ICD-10-CM | POA: Diagnosis not present

## 2018-07-06 DIAGNOSIS — Z720 Tobacco use: Secondary | ICD-10-CM | POA: Diagnosis present

## 2018-07-06 DIAGNOSIS — R0602 Shortness of breath: Secondary | ICD-10-CM

## 2018-07-06 DIAGNOSIS — G8929 Other chronic pain: Secondary | ICD-10-CM | POA: Diagnosis present

## 2018-07-06 DIAGNOSIS — J9819 Other pulmonary collapse: Principal | ICD-10-CM | POA: Diagnosis present

## 2018-07-06 DIAGNOSIS — Z9689 Presence of other specified functional implants: Secondary | ICD-10-CM

## 2018-07-06 DIAGNOSIS — F411 Generalized anxiety disorder: Secondary | ICD-10-CM | POA: Diagnosis present

## 2018-07-06 DIAGNOSIS — Z9889 Other specified postprocedural states: Secondary | ICD-10-CM

## 2018-07-06 DIAGNOSIS — J939 Pneumothorax, unspecified: Secondary | ICD-10-CM

## 2018-07-06 DIAGNOSIS — C569 Malignant neoplasm of unspecified ovary: Secondary | ICD-10-CM | POA: Diagnosis present

## 2018-07-06 DIAGNOSIS — F17210 Nicotine dependence, cigarettes, uncomplicated: Secondary | ICD-10-CM | POA: Diagnosis present

## 2018-07-06 DIAGNOSIS — I959 Hypotension, unspecified: Secondary | ICD-10-CM | POA: Diagnosis not present

## 2018-07-06 DIAGNOSIS — J9601 Acute respiratory failure with hypoxia: Secondary | ICD-10-CM | POA: Diagnosis present

## 2018-07-06 DIAGNOSIS — I878 Other specified disorders of veins: Secondary | ICD-10-CM

## 2018-07-06 DIAGNOSIS — Z885 Allergy status to narcotic agent status: Secondary | ICD-10-CM

## 2018-07-06 DIAGNOSIS — Z4682 Encounter for fitting and adjustment of non-vascular catheter: Secondary | ICD-10-CM

## 2018-07-06 DIAGNOSIS — Z880 Allergy status to penicillin: Secondary | ICD-10-CM

## 2018-07-06 DIAGNOSIS — Z20828 Contact with and (suspected) exposure to other viral communicable diseases: Secondary | ICD-10-CM | POA: Diagnosis present

## 2018-07-06 DIAGNOSIS — Z888 Allergy status to other drugs, medicaments and biological substances status: Secondary | ICD-10-CM

## 2018-07-06 LAB — CBC
HCT: 42 % (ref 36.0–46.0)
Hemoglobin: 13.5 g/dL (ref 12.0–15.0)
MCH: 32.5 pg (ref 26.0–34.0)
MCHC: 32.1 g/dL (ref 30.0–36.0)
MCV: 101.2 fL — ABNORMAL HIGH (ref 80.0–100.0)
Platelets: 524 10*3/uL — ABNORMAL HIGH (ref 150–400)
RBC: 4.15 MIL/uL (ref 3.87–5.11)
RDW: 12.4 % (ref 11.5–15.5)
WBC: 15.6 10*3/uL — ABNORMAL HIGH (ref 4.0–10.5)
nRBC: 0 % (ref 0.0–0.2)

## 2018-07-06 LAB — BASIC METABOLIC PANEL
Anion gap: 12 (ref 5–15)
BUN: 11 mg/dL (ref 6–20)
CO2: 22 mmol/L (ref 22–32)
Calcium: 9 mg/dL (ref 8.9–10.3)
Chloride: 102 mmol/L (ref 98–111)
Creatinine, Ser: 0.75 mg/dL (ref 0.44–1.00)
GFR calc Af Amer: >60 mL/min (ref >60)
GFR calc non Af Amer: >60 mL/min (ref >60)
Glucose, Bld: 142 mg/dL — ABNORMAL HIGH (ref 70–99)
Potassium: 4.4 mmol/L (ref 3.5–5.1)
Sodium: 136 mmol/L (ref 135–145)

## 2018-07-06 LAB — SARS CORONAVIRUS 2 BY RT PCR (HOSPITAL ORDER, PERFORMED IN ~~LOC~~ HOSPITAL LAB): SARS Coronavirus 2: NEGATIVE

## 2018-07-06 LAB — TROPONIN I: Troponin I: <0.03 ng/mL (ref <0.03)

## 2018-07-06 MED ORDER — DM-GUAIFENESIN ER 30-600 MG PO TB12
1.0000 | ORAL_TABLET | Freq: Two times a day (BID) | ORAL | Status: DC | PRN
  Administered 2018-07-07 – 2018-07-08 (×2): 1 via ORAL
  Filled 2018-07-06 (×2): qty 1

## 2018-07-06 MED ORDER — DIPHENHYDRAMINE HCL 25 MG PO CAPS
25.0000 mg | ORAL_CAPSULE | Freq: Three times a day (TID) | ORAL | Status: DC | PRN
  Filled 2018-07-06: qty 1

## 2018-07-06 MED ORDER — ALBUTEROL SULFATE (2.5 MG/3ML) 0.083% IN NEBU: 5.0000 mg | INHALATION_SOLUTION | Freq: Once | RESPIRATORY_TRACT | Status: DC

## 2018-07-06 MED ORDER — HYDROMORPHONE HCL 1 MG/ML IJ SOLN
0.5000 mg | Freq: Once | INTRAMUSCULAR | Status: AC
  Administered 2018-07-06: 0.5 mg via INTRAVENOUS
  Filled 2018-07-06: qty 1

## 2018-07-06 MED ORDER — VANCOMYCIN HCL IN DEXTROSE 1-5 GM/200ML-% IV SOLN
1000.0000 mg | Freq: Two times a day (BID) | INTRAVENOUS | Status: DC
  Administered 2018-07-07 – 2018-07-09 (×7): 1000 mg via INTRAVENOUS
  Filled 2018-07-06 (×8): qty 200

## 2018-07-06 MED ORDER — OXYCODONE-ACETAMINOPHEN 7.5-325 MG PO TABS: 1.0000 | ORAL_TABLET | Freq: Four times a day (QID) | ORAL | 0 refills | Status: DC | PRN

## 2018-07-06 MED ORDER — MORPHINE SULFATE (PF) 4 MG/ML IV SOLN
4.0000 mg | Freq: Once | INTRAVENOUS | Status: DC
  Filled 2018-07-06: qty 1

## 2018-07-06 MED ORDER — OXYCODONE-ACETAMINOPHEN 5-325 MG PO TABS
1.0000 | ORAL_TABLET | ORAL | Status: DC | PRN
  Administered 2018-07-07 – 2018-07-13 (×32): 1 via ORAL
  Filled 2018-07-06 (×33): qty 1

## 2018-07-06 MED ORDER — ONDANSETRON HCL 4 MG/2ML IJ SOLN
4.0000 mg | Freq: Four times a day (QID) | INTRAMUSCULAR | Status: DC | PRN
  Administered 2018-07-08 – 2018-07-13 (×2): 4 mg via INTRAVENOUS
  Filled 2018-07-06: qty 2

## 2018-07-06 MED ORDER — ONDANSETRON HCL 4 MG/2ML IJ SOLN
4.0000 mg | Freq: Once | INTRAMUSCULAR | Status: AC
  Administered 2018-07-06: 4 mg via INTRAVENOUS
  Filled 2018-07-06: qty 2

## 2018-07-06 MED ORDER — SODIUM CHLORIDE 0.9 % IV SOLN
1.0000 g | Freq: Three times a day (TID) | INTRAVENOUS | Status: DC
  Administered 2018-07-07 (×2): 1 g via INTRAVENOUS
  Filled 2018-07-06 (×4): qty 1

## 2018-07-06 MED ORDER — ACETAMINOPHEN 325 MG PO TABS
650.0000 mg | ORAL_TABLET | Freq: Four times a day (QID) | ORAL | Status: DC | PRN
  Administered 2018-07-08: 650 mg via ORAL
  Filled 2018-07-06 (×2): qty 2

## 2018-07-06 MED ORDER — ACETAMINOPHEN 650 MG RE SUPP: 650.0000 mg | Freq: Four times a day (QID) | RECTAL | Status: DC | PRN

## 2018-07-06 MED ORDER — IPRATROPIUM BROMIDE HFA 17 MCG/ACT IN AERS: 2.0000 | INHALATION_SPRAY | RESPIRATORY_TRACT | Status: DC | PRN

## 2018-07-06 MED ORDER — ONDANSETRON HCL 4 MG PO TABS
4.0000 mg | ORAL_TABLET | Freq: Four times a day (QID) | ORAL | Status: DC | PRN
  Administered 2018-07-08: 4 mg via ORAL
  Filled 2018-07-06: qty 1

## 2018-07-06 MED ORDER — UMECLIDINIUM BROMIDE 62.5 MCG/INH IN AEPB
1.0000 | INHALATION_SPRAY | Freq: Every day | RESPIRATORY_TRACT | Status: DC
  Administered 2018-07-07 – 2018-07-13 (×7): 1 via RESPIRATORY_TRACT
  Filled 2018-07-06: qty 7

## 2018-07-06 NOTE — ED Triage Notes (Signed)
Pt states she inhaled some chemicals while cleaning the bathroom approximately one week ago. Pt states she began to feel SOB so she went to the doctor's office and was then diagnosed with a pleural effusion and possible pneumothorax. States she became increasingly SOB today so went to the doctor's office and they did a CT and told her to come to the ED.

## 2018-07-06 NOTE — ED Notes (Signed)
ED TO INPATIENT HANDOFF REPORT  ED Nurse Name and Phone #: William Hamburger [phone]  S Name/Age/Gender Michelle Holt 48 y.o. female Room/Bed: 057C/057C  Code Status   Code Status: Full Code  Home/SNF/Other Home Patient oriented to: situation Is this baseline? Yes   Triage Complete: Triage complete  Chief Complaint SOB  Triage Note Pt states she inhaled some chemicals while cleaning the bathroom approximately one week ago. Pt states she began to feel SOB so she went to the doctor's office and was then diagnosed with a pleural effusion and possible pneumothorax. States she became increasingly SOB today so went to the doctor's office and they did a CT and told her to come to the ED.    Allergies Allergies  Allergen Reactions  . Morphine And Related Itching  . Nortriptyline Other (See Comments)    Significant weight gain  . Topamax [Topiramate] Diarrhea    nausea  . Actifed Cold-Allergy [Chlorpheniramine-Phenyleph Er] Rash  . Amoxicillin Rash  . Codeine Hives  . Erythromycin Rash  . Penicillins Rash  . Sudafed [Pseudoephedrine Hcl] Rash    Level of Care/Admitting Diagnosis ED Disposition    ED Disposition Condition Comment   Admit  Hospital Area: [facility] [[id number]]  Level of Care: Progressive [102]  I expect the patient will be discharged within 24 hours: No (not a candidate for 5C-Observation unit)  Covid Evaluation: Screening Protocol (No Symptoms)  Diagnosis: Pleural effusion [454098]  Admitting Physician: Ivor Costa [[id number]]  Attending Physician: Ivor Costa [[id number]]  PT Class (Do Not Modify): Observation [104]  PT Acc Code (Do Not Modify): Observation [10022]       B Medical/Surgery History Past Medical History:  Diagnosis Date  . Anxiety and depression   . Arthritis of facet joints at multiple vertebral levels    L5-S1  . Constipation   . Dyslipidemia   . Insomnia   . Irritable bowel syndrome   . Migraine   . Muscle tension headache   .  Plantar fasciitis of right foot    Past Surgical History:  Procedure Laterality Date  . CHOLECYSTECTOMY  2008  . COLONOSCOPY N/A 08/13/2013   Procedure: COLONOSCOPY;  Surgeon: Rogene Houston, MD;  [facility];  Service: Endoscopy;  Laterality: N/A;  230-moved to 145 Ann to notify pt  . ESOPHAGOGASTRODUODENOSCOPY    . TUBAL LIGATION Bilateral   . UTERINE ABLATION       A IV Location/Drains/Wounds Patient Lines/Drains/Airways Status   Active Line/Drains/Airways    Name:   Placement date:   Placement time:   Site:   Days:   Peripheral IV 07/06/18 Right Hand   07/06/18    2117    Hand   less than 1          Intake/Output Last 24 hours No intake or output data in the 24 hours ending 07/06/18 2220  Labs/Imaging Results for orders placed or performed during the hospital encounter of 07/06/18 (from the past 48 hour(s))  CBC     Status: Abnormal   Collection Time: 07/06/18  5:43 PM  Result Value Ref Range   WBC 15.6 (H) 4.0 - 10.5 K/uL   RBC 4.15 3.87 - 5.11 MIL/uL   Hemoglobin 13.5 12.0 - 15.0 g/dL   HCT 42.0 36.0 - 46.0 %   MCV 101.2 (H) 80.0 - 100.0 fL   MCH 32.5 26.0 - 34.0 pg   MCHC 32.1 30.0 - 36.0 g/dL   RDW 12.4 11.5 - 15.5 %  Platelets 524 (H) 150 - 400 K/uL   nRBC 0.0 0.0 - 0.2 %    Comment: Performed at [facility], [place] [address] [place], [place] [address]  Basic metabolic panel     Status: Abnormal   Collection Time: 07/06/18  5:43 PM  Result Value Ref Range   Sodium 136 135 - 145 mmol/L   Potassium 4.4 3.5 - 5.1 mmol/L   Chloride 102 98 - 111 mmol/L   CO2 22 22 - 32 mmol/L   Glucose, Bld 142 (H) 70 - 99 mg/dL   BUN 11 6 - 20 mg/dL   Creatinine, Ser 0.75 0.44 - 1.00 mg/dL   Calcium 9.0 8.9 - 10.3 mg/dL   GFR calc non Af Amer >60 >60 mL/min   GFR calc Af Amer >60 >60 mL/min   Anion gap 12 5 - 15    Comment: Performed at [facility], [place] [address], [place], [place] [address]  Troponin I - ONCE - STAT     Status: None    Collection Time: 07/06/18  5:43 PM  Result Value Ref Range   Troponin I <0.03 <0.03 ng/mL    Comment: Performed at [facility], [place] [address], [place], [place] [address]  SARS Coronavirus 2 (CEPHEID- Performed in [facility] hospital lab), Hosp Order     Status: None   Collection Time: 07/06/18  8:37 PM   Specimen: Nasopharyngeal Swab  Result Value Ref Range   SARS Coronavirus 2 NEGATIVE NEGATIVE    Comment: (NOTE) If result is NEGATIVE SARS-CoV-2 target nucleic acids are NOT DETECTED. The SARS-CoV-2 RNA is generally detectable in upper and lower  respiratory specimens during the acute phase of infection. The lowest  concentration of SARS-CoV-2 viral copies this assay can detect is 250  copies / mL. A negative result does not preclude SARS-CoV-2 infection  and should not be used as the sole basis for treatment or other  patient management decisions.  A negative result may occur with  improper specimen collection / handling, submission of specimen other  than nasopharyngeal swab, presence of viral mutation(s) within the  areas targeted by this assay, and inadequate number of viral copies  (<250 copies / mL). A negative result must be combined with clinical  observations, patient history, and epidemiological information. If result is POSITIVE SARS-CoV-2 target nucleic acids are DETECTED. The SARS-CoV-2 RNA is generally detectable in upper and lower  respiratory specimens dur ing the acute phase of infection.  Positive  results are indicative of active infection with SARS-CoV-2.  Clinical  correlation with patient history and other diagnostic information is  necessary to determine patient infection status.  Positive results do  not rule out bacterial infection or co-infection with other viruses. If result is PRESUMPTIVE POSTIVE SARS-CoV-2 nucleic acids MAY BE PRESENT.   A presumptive positive result was obtained on the submitted specimen  and confirmed on repeat testing.   While 2019 novel coronavirus  (SARS-CoV-2) nucleic acids may be present in the submitted sample  additional confirmatory testing may be necessary for epidemiological  and / or clinical management purposes  to differentiate between  SARS-CoV-2 and other Sarbecovirus currently known to infect humans.  If clinically indicated additional testing with an alternate test  methodology [phone]) is advised. The SARS-CoV-2 RNA is generally  detectable in upper and lower respiratory sp ecimens during the acute  phase of infection. The expected result is Negative. Fact Sheet for Patients:  StrictlyIdeas.no Fact Sheet for  Healthcare Providers: BankingDealers.co.za This test is not yet approved or cleared by the [place] and has been authorized for detection and/or diagnosis of SARS-CoV-2 by FDA under an Emergency Use Authorization (EUA).  This EUA will remain in effect (meaning this test can be used) for the duration of the COVID-19 declaration under Section 564(b)(1) of the Act, 21 U.S.C. section 360bbb-3(b)(1), unless the authorization is terminated or revoked sooner. Performed at [facility], [place] [address], [place], [place] [address]    No results found.  Pending Labs Unresulted Labs (From admission, onward)    Start     Ordered   07/07/18 1027  Basic metabolic panel  Tomorrow morning,   R     07/06/18 2208   07/07/18 0500  CBC  Tomorrow morning,   R     07/06/18 2208   07/06/18 2209  Brain natriuretic peptide  ONCE - STAT,   STAT     07/06/18 2208   07/06/18 2207  HIV antibody (Routine Testing)  Once,   STAT     07/06/18 2208   07/06/18 2206  hCG, quantitative, pregnancy  Once,   STAT     07/06/18 2205   07/06/18 2204  Culture, blood (Routine X 2) w Reflex to ID Panel  BLOOD CULTURE X 2,   R (with STAT occurrences)    Comments: Please obtain prior to antibiotic administration.    07/06/18 2203   07/06/18 2204   Protime-INR  ONCE - STAT,   STAT     07/06/18 2203   07/06/18 2204  APTT  ONCE - STAT,   STAT     07/06/18 2203          Vitals/Pain Today's Vitals   07/06/18 2203 07/06/18 2204 07/06/18 2219 07/06/18 2219  BP:  (!) 108/56 108/74   Pulse:  76 (!) 51   Resp:  (!) 22 18   Temp:      TempSrc:      SpO2:  98% 98%   Weight:    74.8 kg  Height:    5\' 6"  (1.676 m)  PainSc: 4        Isolation Precautions Droplet and Contact precautions  Medications Medications  dextromethorphan-guaiFENesin (MUCINEX DM) 30-600 MG per 12 hr tablet 1 tablet (has no administration in time range)  umeclidinium bromide (INCRUSE ELLIPTA) 62.5 MCG/INH 1 puff (has no administration in time range)  acetaminophen (TYLENOL) tablet 650 mg (has no administration in time range)    Or  acetaminophen (TYLENOL) suppository 650 mg (has no administration in time range)  ondansetron (ZOFRAN) tablet 4 mg (has no administration in time range)    Or  ondansetron (ZOFRAN) injection 4 mg (has no administration in time range)  oxyCODONE-acetaminophen (PERCOCET/ROXICET) 5-325 MG per tablet 1 tablet (has no administration in time range)  diphenhydrAMINE (BENADRYL) capsule 25 mg (has no administration in time range)  aztreonam (AZACTAM) 1 g in sodium chloride 0.9 % 100 mL IVPB (has no administration in time range)  ondansetron (ZOFRAN) injection 4 mg (4 mg Intravenous Given 07/06/18 2128)  HYDROmorphone (DILAUDID) injection 0.5 mg (0.5 mg Intravenous Given 07/06/18 2128)    Mobility walks Low fall risk   Focused Assessments Pulmonary Assessment Handoff:  Lung sounds: Bilateral Breath Sounds: Clear L Breath Sounds: Clear R Breath Sounds: Clear O2 Device: Nasal Cannula O2 Flow Rate (L/min): 3 L/min      R Recommendations: See Admitting Provider Note  Report given to:   Additional Notes:  Call if you have more questions.

## 2018-07-06 NOTE — ED Notes (Signed)
Per Xray they would like the provider to see patient first to determine if he would like new imaging since she has had imaging else where.

## 2018-07-06 NOTE — Addendum Note (Signed)
Addended by: Kathrynn Ducking on: 07/06/2018 10:46 AM   Modules accepted: Orders

## 2018-07-06 NOTE — ED Provider Notes (Signed)
[facility] DEPARTMENT Provider Note   CSN: [id number] Arrival date & time: 07/06/18  1723     History   Chief Complaint Chief Complaint  Patient presents with  . Shortness of Breath    HPI Michelle Holt is a 48 y.o. female.     The history is provided by the patient and medical records.  Shortness of Breath Severity:  Moderate Onset quality:  Gradual Duration:  8 days Timing:  Constant Progression:  Worsening Chronicity:  New Context: activity and URI   Context: not known allergens and not smoke exposure   Relieved by:  Nothing Worsened by:  Activity, coughing, deep breathing, exertion, movement and stress Ineffective treatments:  Sitting up, rest, oxygen, lying down and position changes Associated symptoms: chest pain, cough and PND   Associated symptoms: no abdominal pain, no diaphoresis, no headaches, no rash, no syncope, no vomiting and no wheezing   Risk factors: tobacco use   Risk factors: no hx of PE/DVT and no obesity     Past Medical History:  Diagnosis Date  . Anxiety and depression   . Arthritis of facet joints at multiple vertebral levels    L5-S1  . Constipation   . Dyslipidemia   . Insomnia   . Irritable bowel syndrome   . Migraine   . Muscle tension headache   . Plantar fasciitis of right foot     Patient Active Problem List   Diagnosis Date Noted  . Generalized anxiety disorder 05/02/2014  . Unspecified constipation 08/10/2013  . Rectal bleeding 08/10/2013  . Lumbosacral spondylosis without myelopathy 10/23/2011  . Intractable migraine without aura 10/23/2011    Past Surgical History:  Procedure Laterality Date  . CHOLECYSTECTOMY  2008  . COLONOSCOPY N/A 08/13/2013   Procedure: COLONOSCOPY;  Surgeon: Rogene Houston, MD;  Location[facility];  Service: Endoscopy;  Laterality: N/A;  230-moved to 145 Ann to notify pt  . ESOPHAGOGASTRODUODENOSCOPY    . TUBAL LIGATION Bilateral   . UTERINE ABLATION       OB History   No obstetric history on file.      Home Medications    Prior to Admission medications   Medication Sig Start Date End Date Taking? Authorizing Provider  diazepam (VALIUM) 5 MG tablet TAKE ONE TABLET BY MOUTH AT BEDTIME AS NEEDED FOR ANXIETY 02/11/18   Kathrynn Ducking, MD  ibuprofen (ADVIL) 600 MG tablet TAKE ONE TABLET BY MOUTH EVERY 6 HOURS AS NEEDED. 05/18/18   Kathrynn Ducking, MD  oxyCODONE-acetaminophen (PERCOCET) 7.5-325 MG tablet Take 1 tablet by mouth every 6 (six) hours as needed. 07/06/18   Kathrynn Ducking, MD  promethazine (PHENERGAN) 25 MG tablet Take 1 tablet (25 mg total) by mouth every 6 (six) hours as needed. 02/11/18   Kathrynn Ducking, MD  SUMAtriptan (IMITREX) 100 MG tablet TAKE ONE TABLET BY MOUTH PRN UP to TWICE DAILY AS NEEDED. 02/11/18   Kathrynn Ducking, MD    Family History Family History  Problem Relation Age of Onset  . Hypertension Mother   . Obesity Mother   . Diabetes Mother   . Peripheral vascular disease Father   . COPD Brother   . Osteoporosis Brother   . Colon cancer Neg Hx     Social History Social History   Tobacco Use  . Smoking status: Current Every Day Smoker    Packs/day: 0.50    Years: 17.00    Pack years: 8.50  Types: Cigarettes    Last attempt to quit: 12/11/2014    Years since quitting: 3.5  . Smokeless tobacco: Never Used  . Tobacco comment: started back September 2015   Substance Use Topics  . Alcohol use: Yes    Alcohol/week: 1.0 standard drinks    Types: 1 Glasses of wine per week    Comment: Drinks alcohol on occasion  . Drug use: No     Allergies   Nortriptyline, Topamax [topiramate], Actifed cold-allergy [chlorpheniramine-phenyleph er], Amoxicillin, Codeine, Erythromycin, Penicillins, and Sudafed [pseudoephedrine hcl]   Review of Systems Review of Systems  Constitutional: Negative for diaphoresis.  Respiratory: Positive for cough and shortness of breath. Negative for wheezing.    Cardiovascular: Positive for chest pain and PND. Negative for syncope.  Gastrointestinal: Negative for abdominal pain and vomiting.  Skin: Negative for rash.  Neurological: Negative for headaches.  All other systems reviewed and are negative.    Physical Exam Updated Vital Signs BP 138/75 (BP Location: Left Arm)   Pulse 95   Temp 98.3 F (36.8 C) (Oral)   Resp 18   SpO2 91%   Physical Exam Vitals signs and nursing note reviewed.  Constitutional:      Appearance: She is well-developed. She is ill-appearing.  HENT:     Head: Normocephalic and atraumatic.  Eyes:     Extraocular Movements: Extraocular movements intact.     Conjunctiva/sclera: Conjunctivae normal.     Pupils: Pupils are equal, round, and reactive to light.  Neck:     Musculoskeletal: Neck supple.     Vascular: No JVD.  Cardiovascular:     Rate and Rhythm: Normal rate.  Pulmonary:     Effort: Tachypnea and respiratory distress present.     Breath sounds: Examination of the left-upper field reveals decreased breath sounds and rales. Examination of the left-middle field reveals decreased breath sounds and rales. Examination of the left-lower field reveals decreased breath sounds and rales. Decreased breath sounds and rales present.     Comments: O2 sat 93% on 3 L nasal cannula Abdominal:     Palpations: Abdomen is soft.     Tenderness: There is no abdominal tenderness.  Musculoskeletal:     Right lower leg: No edema.     Left lower leg: No edema.  Skin:    General: Skin is warm and dry.     Capillary Refill: Capillary refill takes less than 2 seconds.     Findings: No rash.  Neurological:     General: No focal deficit present.     Mental Status: She is alert and oriented to person, place, and time.      ED Treatments / Results  Labs (all labs ordered are listed, but only abnormal results are displayed) Labs Reviewed  CBC - Abnormal; Notable for the following components:      Result Value   WBC 15.6  (*)    MCV 101.2 (*)    Platelets 524 (*)    All other components within normal limits  BASIC METABOLIC PANEL - Abnormal; Notable for the following components:   Glucose, Bld 142 (*)    All other components within normal limits  TROPONIN I    EKG    Radiology No results found.  Procedures Procedures (including critical care time)  Medications Ordered in ED Medications - No data to display   Initial Impression / Assessment and Plan / ED Course  I have reviewed the triage vital signs and the nursing notes.  Pertinent  labs & imaging results that were available during my care of the patient were reviewed by me and considered in my medical decision making (see chart for details).         Medical Decision Making: Michelle Holt is a 48 y.o. female who presented to the ED today with shortness of breath.  Reviewed and confirmed nursing documentation for past medical history, family history, social history.  On my initial exam, the pt was ill-appearing, tachypneic, on 3 L nasal cannula, not tachycardic, not hypotensive, alert and interactive, GCS 15. Seen at outside hospital, plain film and CT concerning for new large pleural effusion.   Read from outside hospital says "very large, simple appearing left pleural effusion with mediastinal shift to the right and inversion of the left hemidiaphragm.  The left lung remains completely collapsed."  Etiology of new large pleural effusion unclear, could be secondary to neoplastic disease, new onset heart failure, patient does have a history of smoking, no recent history of trauma, less likely hemothorax, no infectious review of systems concerning for empyema, afebrile  All radiology and laboratory studies reviewed independently and with my attending physician, agree with reading provided by radiologist unless otherwise noted.  Upon reassessing patient, patient was calm, no new complaints, stable on 3 L nasal cannula Based on the above  findings, I believe patient requires admission. Patient admitted. The above care was discussed with and agreed upon by my attending physician. Emergency Department Medication Summary:  Medications  ondansetron (ZOFRAN) injection 4 mg (has no administration in time range)  HYDROmorphone (DILAUDID) injection 0.5 mg (has no administration in time range)        Final Clinical Impressions(s) / ED Diagnoses   Final diagnoses:  None    ED Discharge Orders    None       Lonzo Candy, MD 07/06/18 2113    Merrily Pew, MD 07/07/18 [phone]

## 2018-07-06 NOTE — ED Notes (Signed)
Called 2W to notify bed assignment for pt,.

## 2018-07-06 NOTE — Telephone Encounter (Signed)
The oxycodone prescription will be refilled.

## 2018-07-06 NOTE — H&P (Signed)
History and Physical    Michelle Holt UTM:[id number] DOB: 06/11/1970 DOA: 07/06/2018  Referring MD/NP/PA:   PCP: [name], MD   Patient coming from:  The patient is coming from home.  At baseline, pt is independent for most of ADL.        Chief Complaint: SOB  HPI: Michelle Holt is a 48 y.o. female with medical history significant of depression with anxiety, insomnia, IBS, migraine headache, tobacco abuse, who presents with shortness of breath.  Pt states she inhaled some chemicals while cleaning the bathroom approximately one week ago. Pt states she began to feel SOB. She went to the doctor's office and was then diagnosed with a pleural effusion and possible pneumothorax. States she became increasingly SOB today. She was seen by PCP again and had CT. It "Read from outside hospital says "very large, simple appearing left pleural effusion with mediastinal shift to the right and inversion of the left hemidiaphragm.  The left lung remains completely collapsed."    Patient has mild dry cough, no fever or chills.  Denies chest pain.  No runny nose or sore throat.  She states that she has chronic back pain.  No nausea vomiting, diarrhea, abdominal pain, symptoms of UTI or unilateral weakness. Patient ahs oxygen desaturation to 91% on room air, which improved to 98% 3 L nasal cannula oxygen in ED.  ED Course: pt was found to have pending Covid19 test, WBC 15.6, negative troponin, electrolytes renal function okay, pending the repeated CXR. Pt is placed on SDU for obs. CT surgeon, Dr. Prescott Gum was consulted by EDP.   Review of Systems:   General: no fevers, chills, no body weight gain,  has fatigue HEENT: no blurry vision, hearing changes or sore throat Respiratory: has dyspnea, coughing, no wheezing CV: no chest pain, no palpitations GI: no nausea, vomiting, abdominal pain, diarrhea, constipation GU: no dysuria, burning on urination, increased urinary frequency, hematuria  Ext: no leg  edema Neuro: no unilateral weakness, numbness, or tingling, no vision change or hearing loss Skin: no rash, no skin tear. MSK: No muscle spasm, no deformity, no limitation of range of movement in spin Heme: No easy bruising.  Travel history: No recent long distant travel.  Allergy:  Allergies  Allergen Reactions  . Morphine And Related Itching  . Nortriptyline Other (See Comments)    Significant weight gain  . Topamax [Topiramate] Diarrhea    nausea  . Actifed Cold-Allergy [Chlorpheniramine-Phenyleph Er] Rash  . Amoxicillin Rash  . Codeine Hives  . Erythromycin Rash  . Penicillins Rash  . Sudafed [Pseudoephedrine Hcl] Rash    Past Medical History:  Diagnosis Date  . Anxiety and depression   . Arthritis of facet joints at multiple vertebral levels    L5-S1  . Constipation   . Dyslipidemia   . Insomnia   . Irritable bowel syndrome   . Migraine   . Muscle tension headache   . Plantar fasciitis of right foot     Past Surgical History:  Procedure Laterality Date  . CHOLECYSTECTOMY  2008  . COLONOSCOPY N/A 08/13/2013   Procedure: COLONOSCOPY;  Surgeon: Rogene Houston, MD;  Location: [facility];  Service: Endoscopy;  Laterality: N/A;  230-moved to 145 Ann to notify pt  . ESOPHAGOGASTRODUODENOSCOPY    . TUBAL LIGATION Bilateral   . UTERINE ABLATION      Social History:  reports that she has been smoking cigarettes. She has a 8.50 pack-year smoking history. She has  never used smokeless tobacco. She reports current alcohol use of about 1.0 standard drinks of alcohol per week. She reports that she does not use drugs.  Family History:  Family History  Problem Relation Age of Onset  . Hypertension Mother   . Obesity Mother   . Diabetes Mother   . Peripheral vascular disease Father   . COPD Brother   . Osteoporosis Brother   . Colon cancer Neg Hx      Prior to Admission medications   Medication Sig Start Date End Date Taking? Authorizing Provider  diazepam (VALIUM)  5 MG tablet TAKE ONE TABLET BY MOUTH AT BEDTIME AS NEEDED FOR ANXIETY 02/11/18   Kathrynn Ducking, MD  ibuprofen (ADVIL) 600 MG tablet TAKE ONE TABLET BY MOUTH EVERY 6 HOURS AS NEEDED. 05/18/18   Kathrynn Ducking, MD  oxyCODONE-acetaminophen (PERCOCET) 7.5-325 MG tablet Take 1 tablet by mouth every 6 (six) hours as needed. 07/06/18   Kathrynn Ducking, MD  promethazine (PHENERGAN) 25 MG tablet Take 1 tablet (25 mg total) by mouth every 6 (six) hours as needed. 02/11/18   Kathrynn Ducking, MD  SUMAtriptan (IMITREX) 100 MG tablet TAKE ONE TABLET BY MOUTH PRN UP to TWICE DAILY AS NEEDED. 02/11/18   Kathrynn Ducking, MD    Physical Exam: Vitals:   07/06/18 2219 07/06/18 2343 07/06/18 2349 07/07/18 0327  BP:   102/86 98/61  Pulse:   77 75  Resp:   (!) 21 18  Temp:   (!) 97.5 F (36.4 C) (!) 97.5 F (36.4 C)  TempSrc:   Oral Oral  SpO2:   99% 98%  Weight: 74.8 kg 76.6 kg    Height: 5\' 6"  (1.676 m) 5\' 6"  (1.676 m)     General: Not in acute distress HEENT:       Eyes: PERRL, EOMI, no scleral icterus.       ENT: No discharge from the ears and nose, no pharynx injection, no tonsillar enlargement.        Neck: No JVD, no bruit, no mass felt. Heme: No neck lymph node enlargement. Cardiac: S1/S2, RRR, No murmurs, No gallops or rubs. Respiratory: decreased air movement on the left side. No rales, wheezing, rhonchi or rubs. GI: Soft, nondistended, nontender, no rebound pain, no organomegaly, BS present. GU: No hematuria Ext: No pitting leg edema bilaterally. 2+DP/PT pulse bilaterally. Musculoskeletal: No joint deformities, No joint redness or warmth, no limitation of ROM in spin. Skin: No rashes.  Neuro: Alert, oriented X3, cranial nerves II-XII grossly intact, moves all extremities normally. Psych: Patient is not psychotic, no suicidal or hemocidal ideation.  Labs on Admission: I have personally reviewed following labs and imaging studies  CBC: Recent Labs  Lab 07/06/18 1743 07/07/18 0010   WBC 15.6* 17.9*  HGB 13.5 13.9  HCT 42.0 43.1  MCV 101.2* 100.7*  PLT 524* 188*   Basic Metabolic Panel: Recent Labs  Lab 07/06/18 1743 07/07/18 0010  NA 136 137  K 4.4 4.3  CL 102 100  CO2 22 25  GLUCOSE 142* 103*  BUN 11 10  CREATININE 0.75 0.72  CALCIUM 9.0 9.1   GFR: Estimated Creatinine Clearance: 89.9 mL/min (by C-G formula based on SCr of 0.72 mg/dL). Liver Function Tests: Recent Labs  Lab 07/07/18 0010  AST 47*  ALT 81*  ALKPHOS 140*  BILITOT 0.6  PROT 7.0  6.9  ALBUMIN 3.3*  3.3*   No results for input(s): LIPASE, AMYLASE in the last 168 hours.  No results for input(s): AMMONIA in the last 168 hours. Coagulation Profile: Recent Labs  Lab 07/07/18 0010  INR 1.0   Cardiac Enzymes: Recent Labs  Lab 07/06/18 1743  TROPONINI <0.03   BNP (last 3 results) No results for input(s): PROBNP in the last 8760 hours. HbA1C: No results for input(s): HGBA1C in the last 72 hours. CBG: No results for input(s): GLUCAP in the last 168 hours. Lipid Profile: No results for input(s): CHOL, HDL, LDLCALC, TRIG, CHOLHDL, LDLDIRECT in the last 72 hours. Thyroid Function Tests: No results for input(s): TSH, T4TOTAL, FREET4, T3FREE, THYROIDAB in the last 72 hours. Anemia Panel: No results for input(s): VITAMINB12, FOLATE, FERRITIN, TIBC, IRON, RETICCTPCT in the last 72 hours. Urine analysis: No results found for: COLORURINE, APPEARANCEUR, LABSPEC, PHURINE, GLUCOSEU, HGBUR, BILIRUBINUR, KETONESUR, PROTEINUR, UROBILINOGEN, NITRITE, LEUKOCYTESUR Sepsis Labs: @LABRCNTIP (procalcitonin:4,lacticidven:4) ) Recent Results (from the past 240 hour(s))  SARS Coronavirus 2 (CEPHEID- Performed in [facility] hospital lab), Hosp Order     Status: None   Collection Time: 07/06/18  8:37 PM   Specimen: Nasopharyngeal Swab  Result Value Ref Range Status   SARS Coronavirus 2 NEGATIVE NEGATIVE Final    Comment: (NOTE) If result is NEGATIVE SARS-CoV-2 target nucleic acids are NOT  DETECTED. The SARS-CoV-2 RNA is generally detectable in upper and lower  respiratory specimens during the acute phase of infection. The lowest  concentration of SARS-CoV-2 viral copies this assay can detect is 250  copies / mL. A negative result does not preclude SARS-CoV-2 infection  and should not be used as the sole basis for treatment or other  patient management decisions.  A negative result may occur with  improper specimen collection / handling, submission of specimen other  than nasopharyngeal swab, presence of viral mutation(s) within the  areas targeted by this assay, and inadequate number of viral copies  (<250 copies / mL). A negative result must be combined with clinical  observations, patient history, and epidemiological information. If result is POSITIVE SARS-CoV-2 target nucleic acids are DETECTED. The SARS-CoV-2 RNA is generally detectable in upper and lower  respiratory specimens dur ing the acute phase of infection.  Positive  results are indicative of active infection with SARS-CoV-2.  Clinical  correlation with patient history and other diagnostic information is  necessary to determine patient infection status.  Positive results do  not rule out bacterial infection or co-infection with other viruses. If result is PRESUMPTIVE POSTIVE SARS-CoV-2 nucleic acids MAY BE PRESENT.   A presumptive positive result was obtained on the submitted specimen  and confirmed on repeat testing.  While 2019 novel coronavirus  (SARS-CoV-2) nucleic acids may be present in the submitted sample  additional confirmatory testing may be necessary for epidemiological  and / or clinical management purposes  to differentiate between  SARS-CoV-2 and other Sarbecovirus currently known to infect humans.  If clinically indicated additional testing with an alternate test  methodology [phone]) is advised. The SARS-CoV-2 RNA is generally  detectable in upper and lower respiratory sp ecimens during  the acute  phase of infection. The expected result is Negative. Fact Sheet for Patients:  StrictlyIdeas.no Fact Sheet for Healthcare Providers: BankingDealers.co.za This test is not yet approved or cleared by the [place] FDA and has been authorized for detection and/or diagnosis of SARS-CoV-2 by FDA under an Emergency Use Authorization (EUA).  This EUA will remain in effect (meaning this test can be used) for the duration of the COVID-19 declaration under Section 564(b)(1) of the Act, 21 U.S.C.  section 360bbb-3(b)(1), unless the authorization is terminated or revoked sooner. Performed at [facility], [place] [address], [place], [place] [address]   MRSA PCR Screening     Status: None   Collection Time: 07/07/18  1:42 AM   Specimen: Nasal Mucosa; Nasopharyngeal  Result Value Ref Range Status   MRSA by PCR NEGATIVE NEGATIVE Final    Comment:        The GeneXpert MRSA Assay (FDA approved for NASAL specimens only), is one component of a comprehensive MRSA colonization surveillance program. It is not intended to diagnose MRSA infection nor to guide or monitor treatment for MRSA infections. Performed at [facility], [place] [address], [place], [place] [address]      Radiological Exams on Admission: No results found.   EKG: Independently reviewed.  Sinus rhythm, QTC 423, low voltage. Assessment/Plan Principal Problem:   Pleural effusion on left Active Problems:   Generalized anxiety disorder   Migraine   Acute respiratory failure with hypoxia (HCC)   Tobacco abuse   Acute respiratory failure with hypoxia due to large pleural effusion on left: CT showed "very large, simple appearing left pleural effusion with mediastinal shift to the right and inversion of the left hemidiaphragm.  The left lung remains completely collapsed."  Etiology is not clear.  Differential diagnosis include malignancy, CHF and pleurisy. Patient  denies chest pain which is not consistent with pleurisy. No fever, but has leukocytosis with WBC 15.6.  Will start broad antibiotics. CT surgeon, Dr. Prescott Gum was consulted by EDP for possible thoracentesis.  -will place on stepdown bed for observation - Nasal cannula oxygen to maintain oxygen saturation above 93% -As needed Atrovent inhaler for shortness of breath and Mucinex for cough - vancomycin and Aztrenam - blood culture -pleural fluid test analysis orders were put in -check INR/PTT - check BNP -f/u Ct surgeon's recommendations  Generalized anxiety disorder: -continue home prn valium   Migraine: -prn Imitrex  Tobacco abuse: pt states that she has not smoked for about 1 week since she has shortness of breath.  She states she does not need nicotine patch. -Did counseling about importance of quitting smoking    DVT ppx: SCD Code Status: Full code Family Communication: None at bed side.     Disposition Plan:  Anticipate discharge back to previous home environment Consults called: CT surgeon, Dr. Prescott Gum was consult by EDP Admission status: SDU/obs  Date of Service 07/07/2018    [facility]   If 7PM-7AM, please contact night-coverage [email or web address] Password [facility] 07/07/2018, 4:23 AM

## 2018-07-06 NOTE — Progress Notes (Signed)
Pharmacy Antibiotic Note  Michelle Holt is a 48 y.o. female admitted on 07/06/2018 with pleural effusion with leukocytosis.  Pharmacy has been consulted for Vancomycin and Aztreonam dosing.   Vancomycin 1000 mg IV Q 12 hrs. Goal AUC 400-550. Expected AUC: 490 SCr used: 0.75   Plan: Aztreonam 1 gram IV q 8 hours Vancomycin 1 gram IV q12hr Monitor renal function, C&S and vanc levels as needed   Temp (24hrs), Avg:98.3 F (36.8 C), Min:98.3 F (36.8 C), Max:98.3 F (36.8 C)  Recent Labs  Lab 07/06/18 1743  WBC 15.6*  CREATININE 0.75    CrCl cannot be calculated (Unknown ideal weight.).    Allergies  Allergen Reactions  . Morphine And Related Itching  . Nortriptyline Other (See Comments)    Significant weight gain  . Topamax [Topiramate] Diarrhea    nausea  . Actifed Cold-Allergy [Chlorpheniramine-Phenyleph Er] Rash  . Amoxicillin Rash  . Codeine Hives  . Erythromycin Rash  . Penicillins Rash  . Sudafed [Pseudoephedrine Hcl] Rash    Antimicrobials this admission: Vanc 6/22 >>  Aztreonam 6/22 >>   Thank you for allowing pharmacy to be a part of this patient's care.  Alanda Slim, PharmD, [facility] Clinical Pharmacist Please see AMION for all Pharmacists' Contact Phone Numbers 07/06/2018, 10:25 PM

## 2018-07-06 NOTE — Telephone Encounter (Signed)
Pt has called for a refill on her oxyCODONE-acetaminophen (PERCOCET) 7.5-325 MG tablet  [facility]

## 2018-07-06 NOTE — Telephone Encounter (Signed)
I checked the drug registry and the last refill for Oxycodone was 04/29/18 # 40 provided by Dr. Jannifer Franklin.

## 2018-07-07 ENCOUNTER — Observation Stay (HOSPITAL_COMMUNITY): Payer: Medicaid Other

## 2018-07-07 ENCOUNTER — Inpatient Hospital Stay (HOSPITAL_COMMUNITY): Payer: Medicaid Other

## 2018-07-07 ENCOUNTER — Encounter (HOSPITAL_COMMUNITY): Payer: Self-pay | Admitting: Physician Assistant

## 2018-07-07 DIAGNOSIS — I959 Hypotension, unspecified: Secondary | ICD-10-CM | POA: Diagnosis not present

## 2018-07-07 DIAGNOSIS — J90 Pleural effusion, not elsewhere classified: Secondary | ICD-10-CM

## 2018-07-07 DIAGNOSIS — C569 Malignant neoplasm of unspecified ovary: Secondary | ICD-10-CM | POA: Diagnosis present

## 2018-07-07 DIAGNOSIS — Z885 Allergy status to narcotic agent status: Secondary | ICD-10-CM | POA: Diagnosis not present

## 2018-07-07 DIAGNOSIS — Z880 Allergy status to penicillin: Secondary | ICD-10-CM | POA: Diagnosis not present

## 2018-07-07 DIAGNOSIS — R0602 Shortness of breath: Secondary | ICD-10-CM | POA: Diagnosis present

## 2018-07-07 DIAGNOSIS — J918 Pleural effusion in other conditions classified elsewhere: Secondary | ICD-10-CM | POA: Diagnosis present

## 2018-07-07 DIAGNOSIS — Z888 Allergy status to other drugs, medicaments and biological substances status: Secondary | ICD-10-CM | POA: Diagnosis not present

## 2018-07-07 DIAGNOSIS — J910 Malignant pleural effusion: Secondary | ICD-10-CM | POA: Diagnosis not present

## 2018-07-07 DIAGNOSIS — M549 Dorsalgia, unspecified: Secondary | ICD-10-CM | POA: Diagnosis present

## 2018-07-07 DIAGNOSIS — J9819 Other pulmonary collapse: Secondary | ICD-10-CM | POA: Diagnosis present

## 2018-07-07 DIAGNOSIS — Z9689 Presence of other specified functional implants: Secondary | ICD-10-CM | POA: Diagnosis not present

## 2018-07-07 DIAGNOSIS — F17210 Nicotine dependence, cigarettes, uncomplicated: Secondary | ICD-10-CM | POA: Diagnosis present

## 2018-07-07 DIAGNOSIS — J942 Hemothorax: Secondary | ICD-10-CM | POA: Diagnosis not present

## 2018-07-07 DIAGNOSIS — F411 Generalized anxiety disorder: Secondary | ICD-10-CM | POA: Diagnosis present

## 2018-07-07 DIAGNOSIS — Z881 Allergy status to other antibiotic agents status: Secondary | ICD-10-CM | POA: Diagnosis not present

## 2018-07-07 DIAGNOSIS — G43909 Migraine, unspecified, not intractable, without status migrainosus: Secondary | ICD-10-CM | POA: Diagnosis present

## 2018-07-07 DIAGNOSIS — G8929 Other chronic pain: Secondary | ICD-10-CM | POA: Diagnosis present

## 2018-07-07 DIAGNOSIS — J9601 Acute respiratory failure with hypoxia: Secondary | ICD-10-CM | POA: Diagnosis present

## 2018-07-07 DIAGNOSIS — Z20828 Contact with and (suspected) exposure to other viral communicable diseases: Secondary | ICD-10-CM | POA: Diagnosis present

## 2018-07-07 HISTORY — PX: IR THORACENTESIS ASP PLEURAL SPACE W/IMG GUIDE: IMG5380

## 2018-07-07 LAB — BODY FLUID CELL COUNT WITH DIFFERENTIAL
Eos, Fluid: 1 %
Lymphs, Fluid: 89 %
Monocyte-Macrophage-Serous Fluid: 2 % — ABNORMAL LOW (ref 50–90)
Neutrophil Count, Fluid: 8 % (ref 0–25)
Total Nucleated Cell Count, Fluid: 970 cu mm (ref 0–1000)

## 2018-07-07 LAB — EXPECTORATED SPUTUM ASSESSMENT W GRAM STAIN, RFLX TO RESP C

## 2018-07-07 LAB — HCG, QUANTITATIVE, PREGNANCY: hCG, Beta Chain, Quant, S: 6 m[IU]/mL — ABNORMAL HIGH (ref <5)

## 2018-07-07 LAB — AMYLASE, PLEURAL OR PERITONEAL FLUID: Amylase, Fluid: 42 U/L

## 2018-07-07 LAB — GLUCOSE, PLEURAL OR PERITONEAL FLUID: Glucose, Fluid: 46 mg/dL

## 2018-07-07 LAB — ALBUMIN, PLEURAL OR PERITONEAL FLUID: Albumin, Fluid: 2.2 g/dL

## 2018-07-07 LAB — CBC
HCT: 43.1 % (ref 36.0–46.0)
Hemoglobin: 13.9 g/dL (ref 12.0–15.0)
MCH: 32.5 pg (ref 26.0–34.0)
MCHC: 32.3 g/dL (ref 30.0–36.0)
MCV: 100.7 fL — ABNORMAL HIGH (ref 80.0–100.0)
Platelets: 498 10*3/uL — ABNORMAL HIGH (ref 150–400)
RBC: 4.28 MIL/uL (ref 3.87–5.11)
RDW: 12.5 % (ref 11.5–15.5)
WBC: 17.9 10*3/uL — ABNORMAL HIGH (ref 4.0–10.5)
nRBC: 0 % (ref 0.0–0.2)

## 2018-07-07 LAB — BASIC METABOLIC PANEL
Anion gap: 12 (ref 5–15)
BUN: 10 mg/dL (ref 6–20)
CO2: 25 mmol/L (ref 22–32)
Calcium: 9.1 mg/dL (ref 8.9–10.3)
Chloride: 100 mmol/L (ref 98–111)
Creatinine, Ser: 0.72 mg/dL (ref 0.44–1.00)
GFR calc Af Amer: >60 mL/min (ref >60)
GFR calc non Af Amer: >60 mL/min (ref >60)
Glucose, Bld: 103 mg/dL — ABNORMAL HIGH (ref 70–99)
Potassium: 4.3 mmol/L (ref 3.5–5.1)
Sodium: 137 mmol/L (ref 135–145)

## 2018-07-07 LAB — HEPATIC FUNCTION PANEL
ALT: 81 U/L — ABNORMAL HIGH (ref 0–44)
AST: 47 U/L — ABNORMAL HIGH (ref 15–41)
Albumin: 3.3 g/dL — ABNORMAL LOW (ref 3.5–5.0)
Alkaline Phosphatase: 140 U/L — ABNORMAL HIGH (ref 38–126)
Bilirubin, Direct: 0.2 mg/dL (ref 0.0–0.2)
Indirect Bilirubin: 0.4 mg/dL (ref 0.3–0.9)
Total Bilirubin: 0.6 mg/dL (ref 0.3–1.2)
Total Protein: 6.9 g/dL (ref 6.5–8.1)

## 2018-07-07 LAB — GLUCOSE, CAPILLARY: Glucose-Capillary: 86 mg/dL (ref 70–99)

## 2018-07-07 LAB — LACTATE DEHYDROGENASE, PLEURAL OR PERITONEAL FLUID: LD, Fluid: 1381 U/L — ABNORMAL HIGH (ref 3–23)

## 2018-07-07 LAB — GRAM STAIN

## 2018-07-07 LAB — APTT: aPTT: 30 seconds (ref 24–36)

## 2018-07-07 LAB — HIV ANTIBODY (ROUTINE TESTING W REFLEX): HIV Screen 4th Generation wRfx: NONREACTIVE

## 2018-07-07 LAB — PROTEIN, TOTAL: Total Protein: 7 g/dL (ref 6.5–8.1)

## 2018-07-07 LAB — PROTIME-INR
INR: 1 (ref 0.8–1.2)
Prothrombin Time: 13.1 seconds (ref 11.4–15.2)

## 2018-07-07 LAB — LACTATE DEHYDROGENASE: LDH: 193 U/L — ABNORMAL HIGH (ref 98–192)

## 2018-07-07 LAB — PROTEIN, PLEURAL OR PERITONEAL FLUID: Total protein, fluid: 4.1 g/dL

## 2018-07-07 LAB — ALBUMIN: Albumin: 3.3 g/dL — ABNORMAL LOW (ref 3.5–5.0)

## 2018-07-07 LAB — BRAIN NATRIURETIC PEPTIDE: B Natriuretic Peptide: 47.4 pg/mL (ref 0.0–100.0)

## 2018-07-07 LAB — MRSA PCR SCREENING: MRSA by PCR: NEGATIVE

## 2018-07-07 MED ORDER — LIDOCAINE HCL 1 % IJ SOLN
INTRAMUSCULAR | Status: AC
  Filled 2018-07-07: qty 20

## 2018-07-07 MED ORDER — NITROGLYCERIN 0.4 MG SL SUBL
SUBLINGUAL_TABLET | SUBLINGUAL | Status: AC
  Filled 2018-07-07: qty 3

## 2018-07-07 MED ORDER — SODIUM CHLORIDE 0.9 % IV SOLN
2.0000 g | Freq: Two times a day (BID) | INTRAVENOUS | Status: DC
  Administered 2018-07-07 – 2018-07-09 (×6): 2 g via INTRAVENOUS
  Filled 2018-07-07 (×8): qty 2

## 2018-07-07 MED ORDER — SUMATRIPTAN SUCCINATE 50 MG PO TABS
50.0000 mg | ORAL_TABLET | ORAL | Status: DC | PRN
  Filled 2018-07-07: qty 1

## 2018-07-07 MED ORDER — POLYETHYLENE GLYCOL 3350 17 G PO PACK
17.0000 g | PACK | Freq: Every day | ORAL | Status: DC
  Administered 2018-07-07 – 2018-07-10 (×4): 17 g via ORAL
  Filled 2018-07-07 (×4): qty 1

## 2018-07-07 MED ORDER — DIAZEPAM 2 MG PO TABS
1.0000 mg | ORAL_TABLET | Freq: Three times a day (TID) | ORAL | Status: DC | PRN
  Administered 2018-07-07 – 2018-07-13 (×7): 1 mg via ORAL
  Filled 2018-07-07 (×8): qty 1

## 2018-07-07 MED ORDER — NITROGLYCERIN 0.4 MG SL SUBL: 0.4000 mg | SUBLINGUAL_TABLET | SUBLINGUAL | Status: DC | PRN

## 2018-07-07 NOTE — Significant Event (Addendum)
Rapid Response Event Note  Overview: Time Called: 2230 Arrival Time: 2235 Event Type: Cardiac(CP 9/10)  Initial Focused Assessment: Called by nursing staff regarding pt with intense CP. Upon arrival, Michelle Holt is having 9/10 substernal CP with radiation to her left neck. No nausea or SOB. 12 lead EKG is being performed. BBS Clear. Palpable pulses x4. Skin warm, pink and dry. SL NTG not given due to BP.  HR 88 SR, 89/63 (70), RR 17 with Sats 97% on 2LNC. CP resolved without any intervention. PCXR ordered due to Left thoracentesis earlier today. Possible pleuritic pain.   Interventions: -STAT 12 lead EKG (SR, no acute ST elevation)  Plan of Care (if not transferred): -Monitor for further CP -Notify primary svc of events and further orders  Event Summary: Name of Physician Notified: Clance Boll NP at 2250    at    Outcome: Stayed in room and stabalized     Madelynn Done

## 2018-07-07 NOTE — Progress Notes (Signed)
Pharmacy Antibiotic Note  Michelle Holt is a 48 y.o. female admitted on 07/06/2018 with pleural effusion with leukocytosis.  Pharmacy has been consulted for Vancomycin and now to transition Azactam to Cefepime.   The patient is noted to have a low-severity allergy of rash listed to penicillins. Discussed with MD and will trial cephalosporins.   Plan: - D/c Azactam - Start Cefepime 2g IV every 12 hours - Continue Vancomycin 1g IV every 8 hours - Will continue to follow renal function, culture results, LOT, and antibiotic de-escalation plans   Height: 5\' 6"  (167.6 cm) Weight: 168 lb 14 oz (76.6 kg) IBW/kg (Calculated) : 59.3  Temp (24hrs), Avg:97.8 F (36.6 C), Min:97.5 F (36.4 C), Max:98.3 F (36.8 C)  Recent Labs  Lab 07/06/18 1743 07/07/18 0010  WBC 15.6* 17.9*  CREATININE 0.75 0.72    Estimated Creatinine Clearance: 89.9 mL/min (by C-G formula based on SCr of 0.72 mg/dL).    Allergies  Allergen Reactions  . Morphine And Related Itching  . Nortriptyline Other (See Comments)    Significant weight gain  . Topamax [Topiramate] Diarrhea    nausea  . Actifed Cold-Allergy [Chlorpheniramine-Phenyleph Er] Rash  . Amoxicillin Rash  . Codeine Hives  . Erythromycin Rash  . Penicillins Rash  . Sudafed [Pseudoephedrine Hcl] Rash    Vanc 6/23 >> Azactam 6/23 >> 6/23 Cefepime 6/23 >>  6/22 COVID >> neg 6/23 BCx >> 6/23 Pleural fluid >> 6/23 MRSA PCR >>  Thank you for allowing pharmacy to be a part of this patient's care.  Alycia Rossetti, PharmD, BCPS Clinical Pharmacist Clinical phone for 07/07/2018: [phone] 07/07/2018 11:02 AM   **Pharmacist phone directory can now be found on [email or web address] ([email or web address]).  Listed under [place].

## 2018-07-07 NOTE — Progress Notes (Signed)
Daily Nursing Note  Introduced self to patient who vocalized being in pain during the morning, percocet given which aided in pain relief. Seen by CT Surgery in AM. Thoracentesis at [facility] for which 2L of fluid were removed. Patient endorses improvement in breathing post procedure.  Remains on vancomycin and cefepime. Mobilized in afternoon. All patient needs met throughout the course of the day.   DC Plan: Potential discharge tomorrow if remains stable.

## 2018-07-07 NOTE — Progress Notes (Signed)
PROGRESS NOTE    Michelle Holt  YQI:[id number] DOB: 1970-07-12 DOA: 07/06/2018 PCP: [name], MD  Outpatient Specialists:   Brief Narrative:  As per he H&P "Michelle Holt is a 48 y.o. female with medical history significant of depression with anxiety, insomnia, IBS, migraine headache, tobacco abuse, who presents with shortness of breath.  Pt states she inhaled some chemicals while cleaning the bathroom approximately one week ago. Pt states she began to feel SOB. She went to the doctor's office and was then diagnosed with a pleural effusion and possible pneumothorax. States she became increasingly SOB today. She was seen by PCP again and had CT. It "Read from outside hospital says "very large, simple appearing left pleural effusion with mediastinal shift to the right and inversion of the left hemidiaphragm.The left lung remains completely collapsed."    Patient has mild dry cough, no fever or chills.  Denies chest pain.  No runny nose or sore throat.  She states that she has chronic back pain.  No nausea vomiting, diarrhea, abdominal pain, symptoms of UTI or unilateral weakness. Patient ahs oxygen desaturation to 91% on room air, which improved to 98% 3 L nasal cannula oxygen in ED.  ED Course: pt was found to have pending Covid19 test, WBC 15.6, negative troponin, electrolytes renal function okay, pending the repeated CXR. Pt is placed on SDU for obs. CT surgeon, Dr. Prescott Gum was consulted by EDP".  07/07/2018: Patient underwent left-sided thoracentesis by the interventional radiology team.  Thoracentesis yielded 2 L of golden yellow, slightly blood-tinged fluid.  Postthoracentesis chest x-ray is noted.  Will start patient on incentive spirometry and flutter valve.  Will repeat chest x-ray tomorrow.  Patient may need repeat thoracentesis.  Will follow pleural fluid analysis.  Patient feels a lot better after the thoracentesis.  No fever or chills.  No headache or neck pain.  Reported an  episode of thick yellowish sputum earlier today.  Will culture sputum if not already done.  Patient was seen alongside patient's nurse.  Assessment & Plan:   Principal Problem:   Pleural effusion on left Active Problems:   Generalized anxiety disorder   Migraine   Acute respiratory failure with hypoxia (HCC)   Tobacco abuse  Acute respiratory failure with hypoxia due to large pleural effusion on left:  CT showed "very large, simple appearing left pleural effusion with mediastinal shift to the right and inversion of the left hemidiaphragm.The left lung remains completely collapsed."  Etiology is not clear.  Differential diagnosis include malignancy, CHF and pleurisy. Patient denies chest pain which is not consistent with pleurisy. No fever, but has leukocytosis with WBC 15.6.  Will start broad antibiotics. CT surgeon, Dr. Prescott Gum was consulted by EDP for possible thoracentesis.  -will place on stepdown bed for observation - Nasal cannula oxygen to maintain oxygen saturation above 93% -As needed Atrovent inhaler for shortness of breath and Mucinex for cough - vancomycin and Aztrenam - blood culture -pleural fluid test analysis orders were put in -check INR/PTT - check BNP -f/u Ct surgeon's recommendations 07/07/2018: See above documentation.  Patient has undergone left-sided thoracentesis.  Patient seems to be improving.  Will follow pleural fluid analysis.  Further management will depend on hospital course.  Generalized anxiety disorder: -continue home prn valium   Migraine: -prn Imitrex  Tobacco abuse: pt states that she has not smoked for about 1 week since she has shortness of breath.  She states she does not need nicotine patch. -Did  counseling about importance of quitting smoking  DVT ppx: SCD Code Status: Full code Family Communication: .     Disposition Plan:   Home Consults called: CT surgeon, Dr. Prescott Gum was consult by EDP.  Functional radiology team consulted  for thoracentesis.  Procedures:   Left-sided thoracentesis.  Antimicrobials:   IV vancomycin  IV aztreonam has been discontinued.  IV cefepime started.   Subjective: No shortness of breath. No chest pain. No fever or chills. Patient reports an episode of thick yellowish sputum.  Objective: Vitals:   07/06/18 2343 07/06/18 2349 07/07/18 0327 07/07/18 0722  BP:  102/86 98/61 105/70  Pulse:  77 75 79  Resp:  (!) 21 18 20   Temp:  (!) 97.5 F (36.4 C) (!) 97.5 F (36.4 C) 97.8 F (36.6 C)  TempSrc:  Oral Oral Oral  SpO2:  99% 98% 96%  Weight: 76.6 kg     Height: 5\' 6"  (1.676 m)       Intake/Output Summary (Last 24 hours) at 07/07/2018 0940 Last data filed at 07/07/2018 0900 Gross per 24 hour  Intake 661.97 ml  Output -  Net 661.97 ml   Filed Weights   07/06/18 2219 07/06/18 2343  Weight: 74.8 kg 76.6 kg    Examination:  General exam: Appears calm and comfortable  Respiratory system: Decreased air entry left lung field. Cardiovascular system: S1 & S2. No pedal edema. Gastrointestinal system: Abdomen is nondistended, soft and nontender. No organomegaly or masses felt. Normal bowel sounds heard. Central nervous system: Alert and oriented. No focal neurological deficits. Extremities: No leg edema.  Data Reviewed: I have personally reviewed following labs and imaging studies  CBC: Recent Labs  Lab 07/06/18 1743 07/07/18 0010  WBC 15.6* 17.9*  HGB 13.5 13.9  HCT 42.0 43.1  MCV 101.2* 100.7*  PLT 524* 952*   Basic Metabolic Panel: Recent Labs  Lab 07/06/18 1743 07/07/18 0010  NA 136 137  K 4.4 4.3  CL 102 100  CO2 22 25  GLUCOSE 142* 103*  BUN 11 10  CREATININE 0.75 0.72  CALCIUM 9.0 9.1   GFR: Estimated Creatinine Clearance: 89.9 mL/min (by C-G formula based on SCr of 0.72 mg/dL). Liver Function Tests: Recent Labs  Lab 07/07/18 0010  AST 47*  ALT 81*  ALKPHOS 140*  BILITOT 0.6  PROT 7.0  6.9  ALBUMIN 3.3*  3.3*   No results for  input(s): LIPASE, AMYLASE in the last 168 hours. No results for input(s): AMMONIA in the last 168 hours. Coagulation Profile: Recent Labs  Lab 07/07/18 0010  INR 1.0   Cardiac Enzymes: Recent Labs  Lab 07/06/18 1743  TROPONINI <0.03   BNP (last 3 results) No results for input(s): PROBNP in the last 8760 hours. HbA1C: No results for input(s): HGBA1C in the last 72 hours. CBG: Recent Labs  Lab 07/07/18 0722  GLUCAP 86   Lipid Profile: No results for input(s): CHOL, HDL, LDLCALC, TRIG, CHOLHDL, LDLDIRECT in the last 72 hours. Thyroid Function Tests: No results for input(s): TSH, T4TOTAL, FREET4, T3FREE, THYROIDAB in the last 72 hours. Anemia Panel: No results for input(s): VITAMINB12, FOLATE, FERRITIN, TIBC, IRON, RETICCTPCT in the last 72 hours. Urine analysis: No results found for: COLORURINE, APPEARANCEUR, LABSPEC, PHURINE, GLUCOSEU, HGBUR, BILIRUBINUR, KETONESUR, PROTEINUR, UROBILINOGEN, NITRITE, LEUKOCYTESUR Sepsis Labs: @LABRCNTIP (procalcitonin:4,lacticidven:4)  ) Recent Results (from the past 240 hour(s))  SARS Coronavirus 2 (CEPHEID- Performed in [facility] hospital lab), Hosp Order     Status: None   Collection Time: 07/06/18  8:37 PM   Specimen: Nasopharyngeal Swab  Result Value Ref Range Status   SARS Coronavirus 2 NEGATIVE NEGATIVE Final    Comment: (NOTE) If result is NEGATIVE SARS-CoV-2 target nucleic acids are NOT DETECTED. The SARS-CoV-2 RNA is generally detectable in upper and lower  respiratory specimens during the acute phase of infection. The lowest  concentration of SARS-CoV-2 viral copies this assay can detect is 250  copies / mL. A negative result does not preclude SARS-CoV-2 infection  and should not be used as the sole basis for treatment or other  patient management decisions.  A negative result may occur with  improper specimen collection / handling, submission of specimen other  than nasopharyngeal swab, presence of viral mutation(s)  within the  areas targeted by this assay, and inadequate number of viral copies  (<250 copies / mL). A negative result must be combined with clinical  observations, patient history, and epidemiological information. If result is POSITIVE SARS-CoV-2 target nucleic acids are DETECTED. The SARS-CoV-2 RNA is generally detectable in upper and lower  respiratory specimens dur ing the acute phase of infection.  Positive  results are indicative of active infection with SARS-CoV-2.  Clinical  correlation with patient history and other diagnostic information is  necessary to determine patient infection status.  Positive results do  not rule out bacterial infection or co-infection with other viruses. If result is PRESUMPTIVE POSTIVE SARS-CoV-2 nucleic acids MAY BE PRESENT.   A presumptive positive result was obtained on the submitted specimen  and confirmed on repeat testing.  While 2019 novel coronavirus  (SARS-CoV-2) nucleic acids may be present in the submitted sample  additional confirmatory testing may be necessary for epidemiological  and / or clinical management purposes  to differentiate between  SARS-CoV-2 and other Sarbecovirus currently known to infect humans.  If clinically indicated additional testing with an alternate test  methodology [phone]) is advised. The SARS-CoV-2 RNA is generally  detectable in upper and lower respiratory sp ecimens during the acute  phase of infection. The expected result is Negative. Fact Sheet for Patients:  StrictlyIdeas.no Fact Sheet for Healthcare Providers: BankingDealers.co.za This test is not yet approved or cleared by the [place] FDA and has been authorized for detection and/or diagnosis of SARS-CoV-2 by FDA under an Emergency Use Authorization (EUA).  This EUA will remain in effect (meaning this test can be used) for the duration of the COVID-19 declaration under Section 564(b)(1) of the Act,  21 U.S.C. section 360bbb-3(b)(1), unless the authorization is terminated or revoked sooner. Performed at [facility], [place] [address], [place], [place] [address]   MRSA PCR Screening     Status: None   Collection Time: 07/07/18  1:42 AM   Specimen: Nasal Mucosa; Nasopharyngeal  Result Value Ref Range Status   MRSA by PCR NEGATIVE NEGATIVE Final    Comment:        The GeneXpert MRSA Assay (FDA approved for NASAL specimens only), is one component of a comprehensive MRSA colonization surveillance program. It is not intended to diagnose MRSA infection nor to guide or monitor treatment for MRSA infections. Performed at [facility], [place] [address], [place], [place] [address]          Radiology Studies: No results found.      Scheduled Meds: . lidocaine      . umeclidinium bromide  1 puff Inhalation Daily   Continuous Infusions: . aztreonam 1 g (07/07/18 0646)  . vancomycin 200 mL/hr at 07/07/18 0900  LOS: 0 days    Time spent: 84 Minutes    Michelle Allan, MD  [facility] Pager #: [phone] 7PM-7AM contact night coverage as above

## 2018-07-07 NOTE — Consult Note (Addendum)
[address]       [place] [address]             [phone]        Michelle Holt [facility] Medical Record #[id number] Date of Birth: 1970-05-22  Referring: Michelle Holt: Michelle Chroman, MD Primary Cardiologist:Michelle Holt.  Chief Complaint:    Chief Complaint  Patient presents with  . Shortness of Breath    History of Present Illness:      Michelle Holt is a 48 yo white female with history of migraine headaches and nicotine abuse.  She developed symptoms of pain in her back, worse on the right side, shortness of breath, and cough.  She states this developed on Monday last week and progressed.  CXR was obtained by her PCP and showed a pleural effusion.  Further workup with CT scan at outpatient hospital showed a large left sided pleural effusion with complete collapse of the left lung.  She was instructed to report the Emergency Department at which time she re-iterated the above mentioned symptoms.  She denied fevers, nausea, and other respiratory symptoms.  Oxygen saturations were 91 on RA and improved with oxygen. She was admitted for observation and CT surgery consult was requested.  She currently states she is uncomfortable with back pain and shortness of breath.  She has a mild cough.   Current Activity/ Functional Status: Patient is independent with mobility/ambulation, transfers, ADL's, IADL's.   Zubrod Score: At the time of surgery this patient's most appropriate activity status/level should be described as: []     0    Normal activity, Michelle symptoms []     1    Restricted in physical strenuous activity but ambulatory, able to do out light work []     2    Ambulatory and capable of self Holt, unable to do work activities, up and about                 more than 50%  Of the time                            []     3    Only limited self Holt, in bed greater than 50% of waking hours []     4    Completely disabled, Michelle self Holt, confined  to bed or chair []     5    Moribund  Past Medical History:  Diagnosis Date  . Anxiety and depression   . Arthritis of facet joints at multiple vertebral levels    L5-S1  . Constipation   . Dyslipidemia   . Insomnia   . Irritable bowel syndrome   . Migraine   . Muscle tension headache   . Plantar fasciitis of right foot     Past Surgical History:  Procedure Laterality Date  . CHOLECYSTECTOMY  2008  . COLONOSCOPY N/A 08/13/2013   Procedure: COLONOSCOPY;  Surgeon: Rogene Houston, MD;  Location: [facility];  Service: Endoscopy;  Laterality: N/A;  230-moved to 145 Ann to notify pt  . ESOPHAGOGASTRODUODENOSCOPY    . TUBAL LIGATION Bilateral   . UTERINE ABLATION      Social History   Tobacco Use  Smoking Status Current Every Day Smoker  . Packs/day: 0.50  . Years: 17.00  . Pack years: 8.50  . Types: Cigarettes  . Last attempt to quit: 12/11/2014  .  Years since quitting: 3.5  Smokeless Tobacco Never Used  Tobacco Comment   started back September 2015     Social History   Substance and Sexual Activity  Alcohol Use Yes  . Alcohol/week: 1.0 standard drinks  . Types: 1 Glasses of wine per week   Comment: Drinks alcohol on occasion     Allergies  Allergen Reactions  . Morphine And Related Itching  . Nortriptyline Other (See Comments)    Significant weight gain  . Topamax [Topiramate] Diarrhea    nausea  . Actifed Cold-Allergy [Chlorpheniramine-Phenyleph Er] Rash  . Amoxicillin Rash  . Codeine Hives  . Erythromycin Rash  . Penicillins Rash  . Sudafed [Pseudoephedrine Hcl] Rash    Current Facility-Administered Medications  Medication Dose Route Frequency Provider Last Rate Last Dose  . lidocaine (XYLOCAINE) 1 % (with pres) injection           . acetaminophen (TYLENOL) tablet 650 mg  650 mg Oral Q6H PRN Ivor Costa, MD       Or  . acetaminophen (TYLENOL) suppository 650 mg  650 mg Rectal Q6H PRN Ivor Costa, MD      . aztreonam (AZACTAM) 1 g in sodium chloride  0.9 % 100 mL IVPB  1 g Intravenous Q8H Laqueta Linden A, RPH 200 mL/hr at 07/07/18 0646 1 g at 07/07/18 0646  . dextromethorphan-guaiFENesin (MUCINEX DM) 30-600 MG per 12 hr tablet 1 tablet  1 tablet Oral BID PRN Ivor Costa, MD   1 tablet at 07/07/18 [phone]  . diazepam (VALIUM) tablet 1 mg  1 mg Oral Q8H PRN Ivor Costa, MD   1 mg at 07/07/18 0443  . diphenhydrAMINE (BENADRYL) capsule 25 mg  25 mg Oral Q8H PRN Ivor Costa, MD      . ondansetron [facility]) tablet 4 mg  4 mg Oral Q6H PRN Ivor Costa, MD       Or  . ondansetron (ZOFRAN) injection 4 mg  4 mg Intravenous Q6H PRN Ivor Costa, MD      . oxyCODONE-acetaminophen (PERCOCET/ROXICET) 5-325 MG per tablet 1 tablet  1 tablet Oral Q4H PRN Ivor Costa, MD   1 tablet at 07/07/18 [phone]  . SUMAtriptan (IMITREX) tablet 50 mg  50 mg Oral Q2H PRN Ivor Costa, MD      . umeclidinium bromide (INCRUSE ELLIPTA) 62.5 MCG/INH 1 puff  1 puff Inhalation Daily Mesner, Jason, MD   1 puff at 07/07/18 0824  . vancomycin (VANCOCIN) IVPB 1000 mg/200 mL premix  1,000 mg Intravenous Q12H Corinda Gubler, RPH 200 mL/hr at 07/07/18 0847 1,000 mg at 07/07/18 0847    Medications Prior to Admission  Medication Sig Dispense Refill Last Dose  . diazepam (VALIUM) 5 MG tablet TAKE ONE TABLET BY MOUTH AT BEDTIME AS NEEDED FOR ANXIETY 30 tablet 5   . ibuprofen (ADVIL) 600 MG tablet TAKE ONE TABLET BY MOUTH EVERY 6 HOURS AS NEEDED. 60 tablet 3   . oxyCODONE-acetaminophen (PERCOCET) 7.5-325 MG tablet Take 1 tablet by mouth every 6 (six) hours as needed. 40 tablet 0   . promethazine (PHENERGAN) 25 MG tablet Take 1 tablet (25 mg total) by mouth every 6 (six) hours as needed. 30 tablet 5   . SUMAtriptan (IMITREX) 100 MG tablet TAKE ONE TABLET BY MOUTH PRN UP to TWICE DAILY AS NEEDED. 9 tablet 5     Family History  Problem Relation Age of Onset  . Hypertension Mother   . Obesity Mother   . Diabetes Mother   .  Peripheral vascular disease Father   . COPD Brother   . Osteoporosis  Brother   . Colon cancer Neg Hx     Review of Systems:   ROS Constitutional: negative for chills, fevers and night sweats Respiratory: positive for shortness of breath Cardiovascular: positive for dyspnea and fatigue Gastrointestinal: negative for abdominal pain, diarrhea, nausea and vomiting Musculoskeletal:positive for chronic back pain Neurological: negative     Cardiac Review of Systems: Y or  [    ]= Michelle  Chest Pain [    ]  Resting SOB [  Y ] Exertional SOB  [Y  ]  Orthopnea [  ]   Pedal Edema [   ]    Palpitations [  ] Syncope  [  ]   Presyncope [   ]  General Review of Systems: [Y] = yes [  ]=Michelle Constitional: recent weight change Aqua.Slicker  ]; anorexia [  ]; fatigue [  ]; nausea [  ]; night sweats [ N ]; fever Aqua.Slicker  ]; or chills [  ]                                                               Dental: Last Dentist visit:   Eye : blurred vision [  ]; diplopia [   ]; vision changes [  ];  Amaurosis fugax[  ]; Resp: cough [ Y ];  wheezing[  ];  hemoptysis[  ]; shortness of breath[ Y ]; paroxysmal nocturnal dyspnea[  ]; dyspnea on exertion[  ]; or orthopnea[  ];  GI:  gallstones[  ], vomiting[N  ];  dysphagia[  ]; melena[  ];  hematochezia [  ]; heartburn[  ];   Hx of  Colonoscopy[  ]; GU: kidney stones [  ]; hematuria[  ];   dysuria [  ];  nocturia[  ];  history of     obstruction [  ]; urinary frequency [  ]             Skin: rash, swelling[  ];, hair loss[  ];  peripheral edema[  ];  or itching[  ]; Musculosketetal: myalgias[  ];  joint swelling[  ];  joint erythema[  ];  joint pain[  ];  back pain[Y  ];  Heme/Lymph: bruising[  ];  bleeding[  ];  anemia[  ];  Neuro: TIA[  ];  Headaches[Y, migraine  ];  stroke[  ];  vertigo[  ];  seizures[  ];   paresthesias[  ];  difficulty walking[  ];    Physical Exam: BP 105/70 (BP Location: Right Arm)   Pulse 79   Temp 97.8 F (36.6 C) (Oral)   Resp 20   Ht 5\' 6"  (1.676 m)   Wt 76.6 kg   SpO2 96%   BMI 27.26 kg/m    General appearance:  alert, cooperative and Michelle distress Head: Normocephalic, without obvious abnormality, atraumatic Lymph nodes: Cervical, supraclavicular, and axillary nodes normal. Resp: diminished breath sounds on left Cardio: regular rate and rhythm GI: soft, non-tender; bowel sounds normal; Michelle masses,  Michelle organomegaly Neurologic: Grossly normal  Diagnostic Studies & Laboratory data:     Recent Radiology Findings:   Michelle results found.   I have independently reviewed the above radiologic studies and discussed with  the patient   Recent Lab Findings: Lab Results  Component Value Date   WBC 17.9 (H) 07/07/2018   HGB 13.9 07/07/2018   HCT 43.1 07/07/2018   PLT 498 (H) 07/07/2018   GLUCOSE 103 (H) 07/07/2018   ALT 81 (H) 07/07/2018   AST 47 (H) 07/07/2018   NA 137 07/07/2018   K 4.3 07/07/2018   CL 100 07/07/2018   CREATININE 0.72 07/07/2018   BUN 10 07/07/2018   CO2 25 07/07/2018   INR 1.0 07/07/2018    Assessment / Plan:      1. Large Left side Pleural effusion- IR consult placed for drainage, fluid will need sent for cytology, if turbid cultures 2. Repeat CT scan tomorrow or Wednesday after lung re-expands to re-evaluate for possible source of effusion  I  spent 55 minutes counseling the patient face to face.   Ellwood Handler, PA-C  07/07/2018 9:10 AM  Patient examined, recent chest CT images and post- L thoracentesis CXR personally reviewed.Cytology of pleural fluid pending.Will review CXR tomorrow, if there is evidence of significant  residual fluid then patient will need either VATS or Pleurx  Catheter to manage.  patient examined and medical record reviewed,agree with above note. Tharon Aquas Trigt III 07/07/2018

## 2018-07-07 NOTE — Procedures (Signed)
PROCEDURE SUMMARY:  Successful image-guided left thoracentesis. Yielded 2.0 liters of golden yellow, slightly blood tinged fluid. Patient tolerated procedure well. EBL: Zero No immediate complications.  Specimen was sent for labs. Post procedure CXR shows no pneumothorax.  Please see imaging section of Epic for full dictation.  Joaquim Nam PA-C 07/07/2018 10:16 AM

## 2018-07-08 ENCOUNTER — Encounter (HOSPITAL_COMMUNITY): Payer: Self-pay | Admitting: Interventional Radiology

## 2018-07-08 ENCOUNTER — Inpatient Hospital Stay (HOSPITAL_COMMUNITY): Payer: Medicaid Other

## 2018-07-08 DIAGNOSIS — J90 Pleural effusion, not elsewhere classified: Secondary | ICD-10-CM

## 2018-07-08 HISTORY — PX: IR PERC PLEURAL DRAIN W/INDWELL CATH W/IMG GUIDE: IMG5383

## 2018-07-08 LAB — RENAL FUNCTION PANEL
Albumin: 2.3 g/dL — ABNORMAL LOW (ref 3.5–5.0)
Anion gap: 10 (ref 5–15)
BUN: 9 mg/dL (ref 6–20)
CO2: 25 mmol/L (ref 22–32)
Calcium: 8.3 mg/dL — ABNORMAL LOW (ref 8.9–10.3)
Chloride: 103 mmol/L (ref 98–111)
Creatinine, Ser: 0.64 mg/dL (ref 0.44–1.00)
GFR calc Af Amer: >60 mL/min (ref >60)
GFR calc non Af Amer: >60 mL/min (ref >60)
Glucose, Bld: 92 mg/dL (ref 70–99)
Phosphorus: 3.6 mg/dL (ref 2.5–4.6)
Potassium: 3.7 mmol/L (ref 3.5–5.1)
Sodium: 138 mmol/L (ref 135–145)

## 2018-07-08 LAB — CBC WITH DIFFERENTIAL/PLATELET
Abs Immature Granulocytes: 0.1 10*3/uL — ABNORMAL HIGH (ref 0.00–0.07)
Basophils Absolute: 0 10*3/uL (ref 0.0–0.1)
Basophils Relative: 0 %
Eosinophils Absolute: 0.3 10*3/uL (ref 0.0–0.5)
Eosinophils Relative: 3 %
HCT: 39.1 % (ref 36.0–46.0)
Hemoglobin: 12.7 g/dL (ref 12.0–15.0)
Immature Granulocytes: 1 %
Lymphocytes Relative: 13 %
Lymphs Abs: 1.3 10*3/uL (ref 0.7–4.0)
MCH: 32.4 pg (ref 26.0–34.0)
MCHC: 32.5 g/dL (ref 30.0–36.0)
MCV: 99.7 fL (ref 80.0–100.0)
Monocytes Absolute: 1 10*3/uL (ref 0.1–1.0)
Monocytes Relative: 10 %
Neutro Abs: 7.5 10*3/uL (ref 1.7–7.7)
Neutrophils Relative %: 73 %
Platelets: 437 10*3/uL — ABNORMAL HIGH (ref 150–400)
RBC: 3.92 MIL/uL (ref 3.87–5.11)
RDW: 12.3 % (ref 11.5–15.5)
WBC: 10.4 10*3/uL (ref 4.0–10.5)
nRBC: 0 % (ref 0.0–0.2)

## 2018-07-08 LAB — GLUCOSE, CAPILLARY: Glucose-Capillary: 85 mg/dL (ref 70–99)

## 2018-07-08 LAB — TRIGLYCERIDES, BODY FLUIDS: Triglycerides, Fluid: 61 mg/dL

## 2018-07-08 LAB — PROCALCITONIN: Procalcitonin: <0.1 ng/mL

## 2018-07-08 LAB — PH, BODY FLUID: pH, Body Fluid: 7.7

## 2018-07-08 LAB — MAGNESIUM: Magnesium: 1.9 mg/dL (ref 1.7–2.4)

## 2018-07-08 LAB — TSH: TSH: 1.492 u[IU]/mL (ref 0.350–4.500)

## 2018-07-08 MED ORDER — LIDOCAINE HCL 1 % IJ SOLN
INTRAMUSCULAR | Status: AC
  Filled 2018-07-08: qty 20

## 2018-07-08 MED ORDER — MIDAZOLAM HCL 2 MG/2ML IJ SOLN
INTRAMUSCULAR | Status: AC | PRN
  Administered 2018-07-08 (×4): 1 mg via INTRAVENOUS

## 2018-07-08 MED ORDER — LIDOCAINE HCL (PF) 1 % IJ SOLN
INTRAMUSCULAR | Status: AC | PRN
  Administered 2018-07-08: 10 mL

## 2018-07-08 MED ORDER — LACTATED RINGERS IV BOLUS
500.0000 mL | Freq: Once | INTRAVENOUS | Status: AC
  Administered 2018-07-08: 500 mL via INTRAVENOUS

## 2018-07-08 MED ORDER — MIDAZOLAM HCL 2 MG/2ML IJ SOLN
INTRAMUSCULAR | Status: AC
  Filled 2018-07-08: qty 2

## 2018-07-08 MED ORDER — SODIUM CHLORIDE 0.9 % IV SOLN
INTRAVENOUS | Status: AC | PRN
  Administered 2018-07-08: 10 mL/h via INTRAVENOUS

## 2018-07-08 MED ORDER — TRAMADOL HCL 50 MG PO TABS
50.0000 mg | ORAL_TABLET | Freq: Four times a day (QID) | ORAL | Status: DC | PRN
  Administered 2018-07-08: 50 mg via ORAL
  Filled 2018-07-08: qty 1

## 2018-07-08 MED ORDER — FENTANYL CITRATE (PF) 100 MCG/2ML IJ SOLN
INTRAMUSCULAR | Status: AC | PRN
  Administered 2018-07-08: 50 ug via INTRAVENOUS
  Administered 2018-07-08 (×2): 25 ug via INTRAVENOUS

## 2018-07-08 MED ORDER — SODIUM CHLORIDE 0.9 % IV BOLUS
500.0000 mL | Freq: Once | INTRAVENOUS | Status: AC
  Administered 2018-07-08: 500 mL via INTRAVENOUS

## 2018-07-08 MED ORDER — FENTANYL CITRATE (PF) 100 MCG/2ML IJ SOLN
INTRAMUSCULAR | Status: AC
  Filled 2018-07-08: qty 2

## 2018-07-08 MED ORDER — FENTANYL CITRATE (PF) 100 MCG/2ML IJ SOLN
25.0000 ug | Freq: Once | INTRAMUSCULAR | Status: AC
  Administered 2018-07-08: 25 ug via INTRAVENOUS
  Filled 2018-07-08: qty 2

## 2018-07-08 MED ORDER — SODIUM CHLORIDE 0.9% FLUSH
5.0000 mL | Freq: Three times a day (TID) | INTRAVENOUS | Status: DC
  Administered 2018-07-09 – 2018-07-12 (×11): 5 mL

## 2018-07-08 NOTE — Progress Notes (Signed)
Patient at this time down in IR to have pigtail placed per RN

## 2018-07-08 NOTE — Sedation Documentation (Signed)
Dr Vernard Gambles will place PICC prior to chest tube. Write will begin administering sedation medications at this time. Team aware.

## 2018-07-08 NOTE — Procedures (Signed)
  Procedure: LEFT chest tube placement under [place] and fluoro 66f EBL:   minimal Complications:  none immediate  See full dictation in [facility].  Dillard Cannon MD Main # [phone] Pager  [phone]

## 2018-07-08 NOTE — Progress Notes (Addendum)
      [address]       ,[place] [address]             [phone]           Subjective: Patient had chest pain, mostly left sided last evening (likely etiology pleuritic as has large left pleural effusion).  Objective: Vital signs in last 24 hours: Temp:  [98.2 F (36.8 C)-98.7 F (37.1 C)] 98.2 F (36.8 C) (06/23 2300) Pulse Rate:  [78-90] 90 (06/23 2300) Cardiac Rhythm: Normal sinus rhythm (06/23 2153) Resp:  [14-19] 18 (06/23 2300) BP: (85-103)/(57-63) 85/63 (06/23 2300) SpO2:  [95 %-97 %] 97 % (06/23 2300)      Intake/Output from previous day: 06/23 0701 - 06/24 0700 In: 482 [P.O.:420; IV Piggyback:62] Out: -    Physical Exam:  Cardiovascular: RRR Pulmonary: Clear to auscultation on the right and very diminished breath sounds on the left Extremities: SCDs in place    Lab Results: CBC: Recent Labs    07/07/18 0010 07/08/18 0533  WBC 17.9* 10.4  HGB 13.9 12.7  HCT 43.1 39.1  PLT 498* 437*   BMET:  Recent Labs    07/07/18 0010 07/08/18 0533  NA 137 138  K 4.3 3.7  CL 100 103  CO2 25 25  GLUCOSE 103* 92  BUN 10 9  CREATININE 0.72 0.64  CALCIUM 9.1 8.3*    PT/INR:  Recent Labs    07/07/18 0010  LABPROT 13.1  INR 1.0   ABG:  INR: Will add last result for INR, ABG once components are confirmed Will add last 4 CBG results once components are confirmed  Assessment/Plan:  1. CV - SR in the 80's 2.  Pulmonary - S/p left thoracentesis with 2 liters of fluid removed yesterday. On 3 liters of oxygen via [place]. CXR this am appears to show persistent large left pleural effusion. As discussed with Dr. Prescott Gum, will ask IR to place pigtail drainage catheter. Await cytology results of fluid.  3. May benefit from a few doses of Toradol to help with pleuritic pain  Donielle M ZimmermanPA-C 07/08/2018,7:23 AM [phone]  Will ask for IR pigtail catheter- if that doesnot adequately drain the residual effusion she will need VATs   patient examined and medical record reviewed,agree with above note. Tharon Aquas Trigt III 07/08/2018

## 2018-07-08 NOTE — Progress Notes (Signed)
PROGRESS NOTE    [name]  EHU:[id number] DOB: [birth year] DOA: 07/06/2018 PCP: [name], MD  Outpatient Specialists:   Brief Narrative:  As per he H&P "Michelle Holt is a 48 y.o. female with medical history significant of depression with anxiety, insomnia, IBS, migraine headache, tobacco abuse, who presents with shortness of breath.  Pt states she inhaled some chemicals while cleaning the bathroom approximately one week ago. Pt states she began to feel SOB. She went to the doctor's office and was then diagnosed with a pleural effusion and possible pneumothorax. States she became increasingly SOB today. She was seen by PCP again and had CT. It "Read from outside hospital says "very large, simple appearing left pleural effusion with mediastinal shift to the right and inversion of the left hemidiaphragm.The left lung remains completely collapsed."    Patient has mild dry cough, no fever or chills.  Denies chest pain.  No runny nose or sore throat.  She states that she has chronic back pain.  No nausea vomiting, diarrhea, abdominal pain, symptoms of UTI or unilateral weakness. Patient ahs oxygen desaturation to 91% on room air, which improved to 98% 3 L nasal cannula oxygen in ED.  ED Course: pt was found to have pending Covid19 test, WBC 15.6, negative troponin, electrolytes renal function okay, pending the repeated CXR. Pt is placed on SDU for obs. CT surgeon, Dr. Prescott Gum was consulted by EDP".  07/07/2018: Patient underwent left-sided thoracentesis by the interventional radiology team.  Thoracentesis yielded 2 L of golden yellow, slightly blood-tinged fluid.  Postthoracentesis chest x-ray is noted.  Will start patient on incentive spirometry and flutter valve.  Will repeat chest x-ray tomorrow.  Patient may need repeat thoracentesis.  Will follow pleural fluid analysis.  Patient feels a lot better after the thoracentesis.  No fever or chills.  No headache or neck pain.  Reported an  episode of thick yellowish sputum earlier today.  Will culture sputum if not already done.  Patient was seen alongside patient's nurse.  07/08/2018: Patient seen.  Pleural fluid analysis is not completed, but exudative.  Pigtail catheter is planned as per cardiothoracic team, and if indicated, VATS.  Pulmonary input is appreciated.  Assessment & Plan:   Principal Problem:   Pleural effusion on left Active Problems:   Generalized anxiety disorder   Migraine   Acute respiratory failure with hypoxia (HCC)   Tobacco abuse   Pleural effusion  Acute respiratory failure with hypoxia due to large pleural effusion on left:  CT showed "very large, simple appearing left pleural effusion with mediastinal shift to the right and inversion of the left hemidiaphragm.The left lung remains completely collapsed."  Etiology is not clear.  Differential diagnosis include malignancy, CHF and pleurisy. Patient denies chest pain which is not consistent with pleurisy. No fever, but has leukocytosis with WBC 15.6.  Will start broad antibiotics. CT surgeon, Dr. Prescott Gum was consulted by EDP for possible thoracentesis.  -will place on stepdown bed for observation - Nasal cannula oxygen to maintain oxygen saturation above 93% -As needed Atrovent inhaler for shortness of breath and Mucinex for cough - vancomycin and Aztrenam - blood culture -pleural fluid test analysis orders were put in -check INR/PTT - check BNP -f/u Ct surgeon's recommendations 07/07/2018: See above documentation.  Patient has undergone left-sided thoracentesis.  Patient seems to be improving.  Will follow pleural fluid analysis.  Further management will depend on hospital course. 07/08/2018: See above.  For pigtail catheter placement.  Follow pleural fluid analysis.  Generalized anxiety disorder: -continue home prn valium   Migraine: -prn Imitrex  Tobacco abuse: pt states that she has not smoked for about 1 week since she has shortness  of breath.  She states she does not need nicotine patch. -Did counseling about importance of quitting smoking  DVT ppx: SCD Code Status: Full code Family Communication: .     Disposition Plan:   Home Consults called: CT surgeon, Dr. Prescott Gum was consult by EDP.  Functional radiology team consulted for thoracentesis.  Procedures:   Left-sided thoracentesis.  Antimicrobials:   IV vancomycin  IV aztreonam has been discontinued.  IV cefepime started.   Subjective: Reports shortness of breath. No chest pain. No fever or chills. Patient reports an episode of thick yellowish sputum.  Objective: Vitals:   07/08/18 0746 07/08/18 0808 07/08/18 1154 07/08/18 1200  BP: (!) 98/54  (!) 88/62 (!) 82/62  Pulse: 85  81 82  Resp: 15  18 (!) 24  Temp: (!) 97.5 F (36.4 C)  98.2 F (36.8 C)   TempSrc: Oral  Oral   SpO2: 93% 96% 91% 93%  Weight:      Height:        Intake/Output Summary (Last 24 hours) at 07/08/2018 1213 Last data filed at 07/07/2018 1300 Gross per 24 hour  Intake 360 ml  Output --  Net 360 ml   Filed Weights   07/06/18 2219 07/06/18 2343  Weight: 74.8 kg 76.6 kg    Examination:  General exam: Appears calm and comfortable  Respiratory system: Decreased air entry left lung field. Cardiovascular system: S1 & S2. No pedal edema. Gastrointestinal system: Abdomen is nondistended, soft and nontender. No organomegaly or masses felt. Normal bowel sounds heard. Central nervous system: Alert and oriented. No focal neurological deficits. Extremities: No leg edema.  Data Reviewed: I have personally reviewed following labs and imaging studies  CBC: Recent Labs  Lab 07/06/18 1743 07/07/18 0010 07/08/18 0533  WBC 15.6* 17.9* 10.4  NEUTROABS  --   --  7.5  HGB 13.5 13.9 12.7  HCT 42.0 43.1 39.1  MCV 101.2* 100.7* 99.7  PLT 524* 498* 354*   Basic Metabolic Panel: Recent Labs  Lab 07/06/18 1743 07/07/18 0010 07/08/18 0533  NA 136 137 138  K 4.4 4.3 3.7   CL 102 100 103  CO2 22 25 25   GLUCOSE 142* 103* 92  BUN 11 10 9   CREATININE 0.75 0.72 0.64  CALCIUM 9.0 9.1 8.3*  MG  --   --  1.9  PHOS  --   --  3.6   GFR: Estimated Creatinine Clearance: 89.9 mL/min (by C-G formula based on SCr of 0.64 mg/dL). Liver Function Tests: Recent Labs  Lab 07/07/18 0010 07/08/18 0533  AST 47*  --   ALT 81*  --   ALKPHOS 140*  --   BILITOT 0.6  --   PROT 7.0   6.9  --   ALBUMIN 3.3*   3.3* 2.3*   No results for input(s): LIPASE, AMYLASE in the last 168 hours. No results for input(s): AMMONIA in the last 168 hours. Coagulation Profile: Recent Labs  Lab 07/07/18 0010  INR 1.0   Cardiac Enzymes: Recent Labs  Lab 07/06/18 1743  TROPONINI <0.03   BNP (last 3 results) No results for input(s): PROBNP in the last 8760 hours. HbA1C: No results for input(s): HGBA1C in the last 72 hours. CBG: Recent Labs  Lab 07/07/18 0722 07/08/18 0745  GLUCAP  86 85   Lipid Profile: No results for input(s): CHOL, HDL, LDLCALC, TRIG, CHOLHDL, LDLDIRECT in the last 72 hours. Thyroid Function Tests: No results for input(s): TSH, T4TOTAL, FREET4, T3FREE, THYROIDAB in the last 72 hours. Anemia Panel: No results for input(s): VITAMINB12, FOLATE, FERRITIN, TIBC, IRON, RETICCTPCT in the last 72 hours. Urine analysis: No results found for: COLORURINE, APPEARANCEUR, LABSPEC, PHURINE, GLUCOSEU, HGBUR, BILIRUBINUR, KETONESUR, PROTEINUR, UROBILINOGEN, NITRITE, LEUKOCYTESUR Sepsis Labs: @LABRCNTIP (procalcitonin:4,lacticidven:4)  ) Recent Results (from the past 240 hour(s))  SARS Coronavirus 2 (CEPHEID- Performed in [place] hospital lab), Hosp Order     Status: None   Collection Time: 07/06/18  8:37 PM   Specimen: Nasopharyngeal Swab  Result Value Ref Range Status   SARS Coronavirus 2 NEGATIVE NEGATIVE Final    Comment: (NOTE) If result is NEGATIVE SARS-CoV-2 target nucleic acids are NOT DETECTED. The SARS-CoV-2 RNA is generally detectable in upper and  lower  respiratory specimens during the acute phase of infection. The lowest  concentration of SARS-CoV-2 viral copies this assay can detect is 250  copies / mL. A negative result does not preclude SARS-CoV-2 infection  and should not be used as the sole basis for treatment or other  patient management decisions.  A negative result may occur with  improper specimen collection / handling, submission of specimen other  than nasopharyngeal swab, presence of viral mutation(s) within the  areas targeted by this assay, and inadequate number of viral copies  (<250 copies / mL). A negative result must be combined with clinical  observations, patient history, and epidemiological information. If result is POSITIVE SARS-CoV-2 target nucleic acids are DETECTED. The SARS-CoV-2 RNA is generally detectable in upper and lower  respiratory specimens dur ing the acute phase of infection.  Positive  results are indicative of active infection with SARS-CoV-2.  Clinical  correlation with patient history and other diagnostic information is  necessary to determine patient infection status.  Positive results do  not rule out bacterial infection or co-infection with other viruses. If result is PRESUMPTIVE POSTIVE SARS-CoV-2 nucleic acids MAY BE PRESENT.   A presumptive positive result was obtained on the submitted specimen  and confirmed on repeat testing.  While 2019 novel coronavirus  (SARS-CoV-2) nucleic acids may be present in the submitted sample  additional confirmatory testing may be necessary for epidemiological  and / or clinical management purposes  to differentiate between  SARS-CoV-2 and other Sarbecovirus currently known to infect humans.  If clinically indicated additional testing with an alternate test  methodology [phone]) is advised. The SARS-CoV-2 RNA is generally  detectable in upper and lower respiratory sp ecimens during the acute  phase of infection. The expected result is  Negative. Fact Sheet for Patients:  StrictlyIdeas.no Fact Sheet for Healthcare Providers: BankingDealers.co.za This test is not yet approved or cleared by the [place] FDA and has been authorized for detection and/or diagnosis of SARS-CoV-2 by FDA under an Emergency Use Authorization (EUA).  This EUA will remain in effect (meaning this test can be used) for the duration of the COVID-19 declaration under Section 564(b)(1) of the Act, 21 U.S.C. section 360bbb-3(b)(1), unless the authorization is terminated or revoked sooner. Performed at [facility], [place] [address], [place], [place] [address]   MRSA PCR Screening     Status: None   Collection Time: 07/07/18  1:42 AM   Specimen: Nasal Mucosa; Nasopharyngeal  Result Value Ref Range Status   MRSA by PCR NEGATIVE NEGATIVE Final    Comment:  The GeneXpert MRSA Assay (FDA approved for NASAL specimens only), is one component of a comprehensive MRSA colonization surveillance program. It is not intended to diagnose MRSA infection nor to guide or monitor treatment for MRSA infections. Performed at [facility], [place] [address], [place], [place] [address]   Culture, body fluid-bottle     Status: None (Preliminary result)   Collection Time: 07/07/18 10:10 AM   Specimen: Pleura  Result Value Ref Range Status   Specimen Description PLEURAL LEFT  Final   Special Requests NONE  Final   Culture   Final    NO GROWTH < 24 HOURS Performed at [facility], [place] [address], [place], [place] [address]    Report Status PENDING  Incomplete  Gram stain     Status: None   Collection Time: 07/07/18 10:10 AM   Specimen: Pleura  Result Value Ref Range Status   Specimen Description PLEURAL LEFT  Final   Special Requests NONE  Final   Gram Stain   Final    ABUNDANT WBC PRESENT,BOTH PMN AND MONONUCLEAR NO ORGANISMS SEEN Performed at [facility], [address]  [place], [place] [address]    Report Status 07/07/2018 FINAL  Final  Expectorated sputum assessment w rflx to resp cult     Status: None   Collection Time: 07/07/18  5:55 PM   Specimen: Expectorated Sputum  Result Value Ref Range Status   Specimen Description EXPECTORATED SPUTUM  Final   Special Requests NONE  Final   Sputum evaluation   Final    THIS SPECIMEN IS ACCEPTABLE FOR SPUTUM CULTURE Performed at [facility], [place] [address], [place], [place] [address]    Report Status 07/07/2018 FINAL  Final  Culture, respiratory     Status: None (Preliminary result)   Collection Time: 07/07/18  5:55 PM  Result Value Ref Range Status   Specimen Description EXPECTORATED SPUTUM  Final   Special Requests NONE Reflexed from [id number]  Final   Gram Stain   Final    ABUNDANT WBC PRESENT, PREDOMINANTLY PMN FEW GRAM POSITIVE COCCI    Culture   Final    TOO YOUNG TO READ Performed at [facility], [place] [address] [place], [place] [address]    Report Status PENDING  Incomplete         Radiology Studies: Dg Chest 1 View  Result Date: 07/07/2018 CLINICAL DATA:  Post thoracentesis on the left. EXAM: CHEST  1 VIEW COMPARISON:  Radiographs 07/03/2018 and 06/24/2016.  CT 07/06/2018. FINDINGS: There has been partial re-expansion of the left lung following thoracentesis. There is still a sizable left pleural effusion with near complete whiteout of the left hemithorax. No residual mediastinal shift or pneumothorax identified. The right lung is clear. IMPRESSION: No pneumothorax following thoracentesis on the left. Residual sizable left pleural effusion and left lung collapse. Electronically Signed   By: Richardean Sale M.D.   On: 07/07/2018 10:31   Dg Chest 2 View  Result Date: 07/08/2018 CLINICAL DATA:  Thoracentesis, left-sided chest pain EXAM: CHEST - 2 VIEW COMPARISON:  07/07/2018 FINDINGS: No significant change in a large left pleural effusion, with near-total consolidation of the left lung.  There is no acute appearing airspace opacity of the aerated portion of the left pulmonary apex. No significant pneumothorax. The right lung is normally aerated. The cardiac silhouette is predominantly obscured. Disc degenerative disease of the thoracic spine IMPRESSION: No significant change in a large left pleural effusion,  with near-total consolidation of the left lung. There is no acute appearing airspace opacity of the aerated portion of the left pulmonary apex. No significant pneumothorax. The right lung is normally aerated. Electronically Signed   By: Eddie Candle M.D.   On: 07/08/2018 09:37   Dg Chest Port 1 View  Result Date: 07/07/2018 CLINICAL DATA:  Left-sided pleural effusion status post thoracentesis EXAM: PORTABLE CHEST 1 VIEW COMPARISON:  July 07, 2018 FINDINGS: There is a persistent large left-sided pleural effusion. There is no pneumothorax. The heart is difficult to fully evaluate on this exam. The right lung field is mostly clear. There is some mild volume overload. IMPRESSION: Interval decrease in size of the previously demonstrated left-sided pleural effusion. There is a persistent large left-sided pleural effusion without evidence of a pneumothorax. Electronically Signed   By: Constance Holster M.D.   On: 07/07/2018 23:33   Ir Thoracentesis Asp Pleural Space W/img Guide  Result Date: 07/07/2018 INDICATION: Patient with history of migraines, tobacco abuse and new onset dyspnea with large left pleural effusion of uncertain etiology. Request for diagnostic and therapeutic left side thoracentesis today in IR. EXAM: ULTRASOUND GUIDED LEFT THORACENTESIS MEDICATIONS: 13 mL 1% lidocaine COMPLICATIONS: None immediate. PROCEDURE: An ultrasound guided thoracentesis was thoroughly discussed with the patient and questions answered. The benefits, risks, alternatives and complications were also discussed. The patient understands and wishes to proceed with the procedure. Written consent was obtained.  Ultrasound was performed to localize and mark an adequate pocket of fluid in the left chest. The area was then prepped and draped in the normal sterile fashion. 1% Lidocaine was used for local anesthesia. Under ultrasound guidance a 6 Fr Safe-T-Centesis catheter was introduced. Thoracentesis was performed. The catheter was removed and a dressing applied. FINDINGS: A total of approximately 2.0 L of golden yellow, blood tinged fluid was removed. Samples were sent to the laboratory as requested by the clinical team. IMPRESSION: Successful ultrasound guided left thoracentesis yielding 2.0 L of pleural fluid. Read by Candiss Norse, PA-C Electronically Signed   By: Sandi Mariscal M.D.   On: 07/07/2018 10:36        Scheduled Meds:  polyethylene glycol  17 g Oral Daily   umeclidinium bromide  1 puff Inhalation Daily   Continuous Infusions:  ceFEPime (MAXIPIME) IV 2 g (07/08/18 0941)   vancomycin 1,000 mg (07/08/18 1028)     LOS: 1 day    Time spent: 25 Minutes    Dana Allan, MD  [facility] Pager #: [phone] 7PM-7AM contact night coverage as above

## 2018-07-08 NOTE — Procedures (Signed)
  Procedure: R arm DL PICC   EBL:   minimal Complications:  none immediate  See full dictation in [facility].  Dillard Cannon MD Main # [phone] Pager  [phone]

## 2018-07-08 NOTE — Consult Note (Signed)
Name: Michelle Holt MRN: [id number] DOB: May 27, 1970    ADMISSION DATE:  07/06/2018 CONSULTATION DATE: 07/08/2018  REFERRING MD : [facility]  CHIEF COMPLAINT: Shortness of breath chest pain  BRIEF PATIENT DESCRIPTION: 48 year old female no acute distress  SIGNIFICANT EVENTS  07/07/2018 left thoracentesis with 2 L of golden fluid obtained. Pleural fluid shows an LDH of 1.38 total protein of 4.1 and amylase of 42 and albumin of 2.2.  Blood LDH is 193 blood total protein is 69 but albumin is 2.3.  Cytology and culture data is pending  STUDIES:  622 CT of the chest shows large left pleural effusion with mediastinal shift to the right and inversion of the left hemidiaphragm.  Nonspecific patchy groundglass opacities in the right lung without thoracic adenopathy   HISTORY OF PRESENT ILLNESS:   48 year old female is been a smoker since age 39 with approximately half pack per day and notes approximately 1 week of increasing shortness of breath.  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  She had increasing shortness of breath raising pain on the right and sought medical attention and was found to have a large left pleural effusion.  07/07/2018 she underwent thoracentesis with 2 L removed with sizable pleural effusion remaining.  She had a cardiovascular thoracic surgery evaluation suggested possible pigtail catheter and/or VATS.  The readings of the pleural fluid analysis consistent with exudative pleural effusion.  She denies fevers chills sweats or purulent sputum.  She has developed a cough that with clear fluid.  Denies exposure to COVID patients and recover tested on 07/06/2018 was negative. Pulmonary critical care asked to evaluate on 07/08/2018.  PAST MEDICAL HISTORY :   has a past medical history of Anxiety and depression, Arthritis of facet joints at multiple  vertebral levels, Constipation, Dyslipidemia, Insomnia, Irritable bowel syndrome, Migraine, Muscle tension headache, and Plantar fasciitis of right foot.  has a past surgical history that includes UTERINE ABLATION; Tubal ligation (Bilateral); Cholecystectomy (2008); Esophagogastroduodenoscopy; Colonoscopy (N/A, 08/13/2013); and IR THORACENTESIS ASP PLEURAL SPACE W/IMG GUIDE (07/07/2018). Prior to Admission medications   Medication Sig Start Date End Date Taking? Authorizing Provider  diazepam (VALIUM) 5 MG tablet TAKE ONE TABLET BY MOUTH AT BEDTIME AS NEEDED FOR ANXIETY Patient taking differently: Take 5 mg by mouth at bedtime as needed for anxiety.  02/11/18  Yes Kathrynn Ducking, MD  ibuprofen (ADVIL) 600 MG tablet TAKE ONE TABLET BY MOUTH EVERY 6 HOURS AS NEEDED. Patient taking differently: Take 600 mg by mouth every 6 (six) hours as needed for headache.  05/18/18  Yes Kathrynn Ducking, MD  oxyCODONE-acetaminophen (PERCOCET) 7.5-325 MG tablet Take 1 tablet by mouth every 6 (six) hours as needed. Patient taking differently: Take 1 tablet by mouth every 6 (six) hours as needed (migraine).  07/06/18  Yes Kathrynn Ducking, MD  predniSONE (DELTASONE) 20 MG tablet Take 10-40 mg by mouth See admin instructions. TAKE TWO (2) TABLETS BY MOUTH EVERY MORNING FOR 5 DAYS, THEN ONE TABLET EVERY MORNING FOR FOUR DAYS, THEN ONE-HALF TABLET DAILY FOR FOUR DAYS, THEN STOP. 07/03/18  Yes [provider]  promethazine (PHENERGAN) 25 MG tablet Take 1 tablet (25 mg total) by mouth every 6 (six) hours as needed. Patient taking differently: Take 25 mg by mouth every 6 (six) hours as needed for nausea or vomiting.  02/11/18  Yes Kathrynn Ducking, MD  SUMAtriptan (IMITREX) 100 MG tablet TAKE ONE TABLET BY MOUTH PRN UP to TWICE DAILY AS NEEDED. Patient taking differently: Take 100 mg by mouth 2 (two) times daily as needed for migraine.  02/11/18  Yes Kathrynn Ducking, MD  traMADol (ULTRAM) 50 MG tablet Take 50 mg by  mouth every 8 (eight) hours as needed for pain. 07/03/18  Yes [provider]   Allergies  Allergen Reactions   Morphine And Related Itching   Nortriptyline Other (See Comments)    Significant weight gain   Topamax [Topiramate] Diarrhea    nausea   Actifed Cold-Allergy [Chlorpheniramine-Phenyleph Er] Rash   Amoxicillin Rash    Did it involve swelling of the face/tongue/throat, SOB, or low BP? Unknown Did it involve sudden or severe rash/hives, skin peeling, or any reaction on the inside of your mouth or nose? Unknown Did you need to seek medical attention at a hospital or doctor's office? Unknown When did it last happen? teenager If all above answers are "NO", may proceed with cephalosporin use.    Codeine Hives   Erythromycin Rash   Penicillins Rash    Did it involve swelling of the face/tongue/throat, SOB, or low BP? Unknown Did it involve sudden or severe rash/hives, skin peeling, or any reaction on the inside of your mouth or nose? Unknown Did you need to seek medical attention at a hospital or doctor's office? Unknown When did it last happen? teenager If all above answers are "NO", may proceed with cephalosporin use.    Sudafed [Pseudoephedrine Hcl] Rash    FAMILY HISTORY:  family history includes COPD in her brother; Diabetes in her mother; Hypertension in her mother; Obesity in her mother; Osteoporosis in her brother; Peripheral vascular disease in her father. SOCIAL HISTORY:  reports that she has been smoking cigarettes. She has a 8.50 pack-year smoking history. She has never used smokeless tobacco. She reports current alcohol use of about 1.0 standard drinks of alcohol per week. She reports that she does not use drugs.  REVIEW OF SYSTEMS:   10 point review of system taken, please see HPI for positives and negatives.  SUBJECTIVE:  No acute distress at rest.  Shortness of breath is improved status post thoracentesis.  Complains of chest pain  that is improved with thoracentesis. VITAL SIGNS: Temp:  [97.5 F (36.4 C)-98.7 F (37.1 C)] 97.5 F (36.4 C) (06/24 0746) Pulse Rate:  [78-90] 85 (06/24 0746) Resp:  [14-19] 15 (06/24 0746) BP: (85-103)/(54-63) 98/54 (06/24 0746) SpO2:  [93 %-97 %] 96 % (06/24 0808)  PHYSICAL EXAMINATION: General: Well-nourished well-developed female no acute distress Neuro: Fully intact.  Is all extremities alert and orientated HEENT: No JVD or lymphadenopathy is appreciated Cardiovascular: Sounds irregular regular rate and rhythm Lungs: Decreased breath sounds on left dull to percussion two thirds of the way down.  Band-Aid from thoracentesis on left noted Abdomen: Soft nontender positive bowel sounds Musculoskeletal: Intact no abnormalities Skin: Warm dry well tanned  Recent Labs  Lab 07/06/18 1743 07/07/18 0010 07/08/18 0533  NA 136 137 138  K 4.4 4.3 3.7  CL 102 100 103  CO2 22 25 25   BUN 11 10 9   CREATININE 0.75 0.72 0.64  GLUCOSE 142* 103* 92   Recent Labs  Lab 07/06/18 1743 07/07/18 0010 07/08/18 0533  HGB 13.5 13.9 12.7  HCT 42.0 43.1 39.1  WBC 15.6* 17.9* 10.4  PLT 524* 498* 437*   Dg Chest 1 View  Result Date:  07/07/2018 CLINICAL DATA:  Post thoracentesis on the left. EXAM: CHEST  1 VIEW COMPARISON:  Radiographs 07/03/2018 and 06/24/2016.  CT 07/06/2018. FINDINGS: There has been partial re-expansion of the left lung following thoracentesis. There is still a sizable left pleural effusion with near complete whiteout of the left hemithorax. No residual mediastinal shift or pneumothorax identified. The right lung is clear. IMPRESSION: No pneumothorax following thoracentesis on the left. Residual sizable left pleural effusion and left lung collapse. Electronically Signed   By: Richardean Sale M.D.   On: 07/07/2018 10:31   Dg Chest 2 View  Result Date: 07/08/2018 CLINICAL DATA:  Thoracentesis, left-sided chest pain EXAM: CHEST - 2 VIEW COMPARISON:  07/07/2018 FINDINGS: No  significant change in a large left pleural effusion, with near-total consolidation of the left lung. There is no acute appearing airspace opacity of the aerated portion of the left pulmonary apex. No significant pneumothorax. The right lung is normally aerated. The cardiac silhouette is predominantly obscured. Disc degenerative disease of the thoracic spine IMPRESSION: No significant change in a large left pleural effusion, with near-total consolidation of the left lung. There is no acute appearing airspace opacity of the aerated portion of the left pulmonary apex. No significant pneumothorax. The right lung is normally aerated. Electronically Signed   By: Eddie Candle M.D.   On: 07/08/2018 09:37   Dg Chest Port 1 View  Result Date: 07/07/2018 CLINICAL DATA:  Left-sided pleural effusion status post thoracentesis EXAM: PORTABLE CHEST 1 VIEW COMPARISON:  July 07, 2018 FINDINGS: There is a persistent large left-sided pleural effusion. There is no pneumothorax. The heart is difficult to fully evaluate on this exam. The right lung field is mostly clear. There is some mild volume overload. IMPRESSION: Interval decrease in size of the previously demonstrated left-sided pleural effusion. There is a persistent large left-sided pleural effusion without evidence of a pneumothorax. Electronically Signed   By: Constance Holster M.D.   On: 07/07/2018 23:33   Ir Thoracentesis Asp Pleural Space W/img Guide  Result Date: 07/07/2018 INDICATION: Patient with history of migraines, tobacco abuse and new onset dyspnea with large left pleural effusion of uncertain etiology. Request for diagnostic and therapeutic left side thoracentesis today in IR. EXAM: ULTRASOUND GUIDED LEFT THORACENTESIS MEDICATIONS: 13 mL 1% lidocaine COMPLICATIONS: None immediate. PROCEDURE: An ultrasound guided thoracentesis was thoroughly discussed with the patient and questions answered. The benefits, risks, alternatives and complications were also  discussed. The patient understands and wishes to proceed with the procedure. Written consent was obtained. Ultrasound was performed to localize and mark an adequate pocket of fluid in the left chest. The area was then prepped and draped in the normal sterile fashion. 1% Lidocaine was used for local anesthesia. Under ultrasound guidance a 6 Fr Safe-T-Centesis catheter was introduced. Thoracentesis was performed. The catheter was removed and a dressing applied. FINDINGS: A total of approximately 2.0 L of golden yellow, blood tinged fluid was removed. Samples were sent to the laboratory as requested by the clinical team. IMPRESSION: Successful ultrasound guided left thoracentesis yielding 2.0 L of pleural fluid. Read by Candiss Norse, PA-C Electronically Signed   By: Sandi Mariscal M.D.   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  Without fevers chills sweats or purulent sputum production.  No exposure to infected personnel.  Status post thoracentesis on the left 2 L removed on 07/07/2018 significant pleural fluid remaining performed per interventional radiology.  Status post CVTS evaluation the possible need for VATS again on 07/07/2018.  Further discussion for possible pigtail placement per interventional radiology. Pulmonary consulted on 07/08/2018 for further evaluation and treatment of pleural effusion.      PLAN:  Pleural fluid appears to be exudative in nature.  Cytology is pending.  Culture data is pending. Further evaluation once cytology is available and culture data. Agree with possible pigtail catheter placement and/or video-assisted thoracic surgery. Tobacco cessation   All other  issues per primary team.   Richardson Landry Fina Heizer ACNP Maryanna Shape PCCM Pager [phone] till 1 pm If no answer page [phone] 07/08/2018, 10:38 AM

## 2018-07-08 NOTE — Consult Note (Signed)
Chief Complaint: Patient was seen in consultation today for left pigtail chest tube placement Chief Complaint  Patient presents with   Shortness of Breath   at the request of Dr Darcey Nora   Supervising Physician: Corrie Mckusick  Patient Status: [facility] - In-pt  History of Present Illness: Michelle Holt is a 48 y.o. female   +smoker Back pain and SOB -- worsening x 1 week PCP had CXR performed and revealed Left pleural effusion  CT IMPRESSION: 1. No definitive signs of thoracic malignancy are identified. However, there is a very large, simple-appearing left pleural effusion with mediastinal shift to the right and inversion of the left hemidiaphragm. The left lung remains completely collapsed. A central obstructing neoplasm cannot be completely excluded. Recommend therapeutic thoracentesis and cytologic analysis of the pleural fluid. 2. Nonspecific patchy ground-glass opacities within the right lung, likely inflammatory/infectious. 3. No thoracic adenopathy.  Left thoracentesis performed yesterday : 2L yellow fluid  CXR today:  IMPRESSION: No significant change in a large left pleural effusion, with near-total consolidation of the left lung. There is no acute appearing airspace opacity of the aerated portion of the left pulmonary apex. No significant pneumothorax. The right lung is normally aerated.  Dr Darcey Nora requesting pigtail catheter chest tube placement Approved per Dr Earleen Newport Scheduled for today    Past Medical History:  Diagnosis Date   Anxiety and depression    Arthritis of facet joints at multiple vertebral levels    L5-S1   Constipation    Dyslipidemia    Insomnia    Irritable bowel syndrome    Migraine    Muscle tension headache    Plantar fasciitis of right foot     Past Surgical History:  Procedure Laterality Date   CHOLECYSTECTOMY  2008   COLONOSCOPY N/A 08/13/2013   Procedure: COLONOSCOPY;  Surgeon: Rogene Houston, MD;   Location: [facility] SUITE;  Service: Endoscopy;  Laterality: N/A;  230-moved to 145 Ann to notify pt   ESOPHAGOGASTRODUODENOSCOPY     IR THORACENTESIS ASP PLEURAL SPACE W/IMG GUIDE  07/07/2018   TUBAL LIGATION Bilateral    UTERINE ABLATION      Allergies: Morphine and related, Nortriptyline, Topamax [topiramate], Actifed cold-allergy [chlorpheniramine-phenyleph er], Amoxicillin, Codeine, Erythromycin, Penicillins, and Sudafed [pseudoephedrine hcl]  Medications: Prior to Admission medications   Medication Sig Start Date End Date Taking? Authorizing Provider  diazepam (VALIUM) 5 MG tablet TAKE ONE TABLET BY MOUTH AT BEDTIME AS NEEDED FOR ANXIETY Patient taking differently: Take 5 mg by mouth at bedtime as needed for anxiety.  02/11/18  Yes Kathrynn Ducking, MD  ibuprofen (ADVIL) 600 MG tablet TAKE ONE TABLET BY MOUTH EVERY 6 HOURS AS NEEDED. Patient taking differently: Take 600 mg by mouth every 6 (six) hours as needed for headache.  05/18/18  Yes Kathrynn Ducking, MD  oxyCODONE-acetaminophen (PERCOCET) 7.5-325 MG tablet Take 1 tablet by mouth every 6 (six) hours as needed. Patient taking differently: Take 1 tablet by mouth every 6 (six) hours as needed (migraine).  07/06/18  Yes Kathrynn Ducking, MD  predniSONE (DELTASONE) 20 MG tablet Take 10-40 mg by mouth See admin instructions. TAKE TWO (2) TABLETS BY MOUTH EVERY MORNING FOR 5 DAYS, THEN ONE TABLET EVERY MORNING FOR FOUR DAYS, THEN ONE-HALF TABLET DAILY FOR FOUR DAYS, THEN STOP. 07/03/18  Yes [provider]  promethazine (PHENERGAN) 25 MG tablet Take 1 tablet (25 mg total) by mouth every 6 (six) hours as needed. Patient taking differently: Take 25 mg by  mouth every 6 (six) hours as needed for nausea or vomiting.  02/11/18  Yes Kathrynn Ducking, MD  SUMAtriptan (IMITREX) 100 MG tablet TAKE ONE TABLET BY MOUTH PRN UP to TWICE DAILY AS NEEDED. Patient taking differently: Take 100 mg by mouth 2 (two) times daily as needed for migraine.   02/11/18  Yes Kathrynn Ducking, MD  traMADol (ULTRAM) 50 MG tablet Take 50 mg by mouth every 8 (eight) hours as needed for pain. 07/03/18  Yes [provider]     Family History  Problem Relation Age of Onset   Hypertension Mother    Obesity Mother    Diabetes Mother    Peripheral vascular disease Father    COPD Brother    Osteoporosis Brother    Colon cancer Neg Hx     Social History   Socioeconomic History   Marital status: Widowed    Spouse name: Not on file   Number of children: 2   Years of education: 2-College   Highest education level: Not on file  Occupational History    Employer: [employer]  Social Needs   Financial resource strain: Not on file   Food insecurity    Worry: Not on file    Inability: Not on file   Transportation needs    Medical: Not on file    Non-medical: Not on file  Tobacco Use   Smoking status: Current Every Day Smoker    Packs/day: 0.50    Years: 17.00    Pack years: 8.50    Types: Cigarettes    Last attempt to quit: 12/11/2014    Years since quitting: 3.5   Smokeless tobacco: Never Used   Tobacco comment: started back September 2015   Substance and Sexual Activity   Alcohol use: Yes    Alcohol/week: 1.0 standard drinks    Types: 1 Glasses of wine per week    Comment: Drinks alcohol on occasion   Drug use: No   Sexual activity: Not on file  Lifestyle   Physical activity    Days per week: Not on file    Minutes per session: Not on file   Stress: Not on file  Relationships   Social connections    Talks on phone: Not on file    Gets together: Not on file    Attends religious service: Not on file    Active member of club or organization: Not on file    Attends meetings of clubs or organizations: Not on file    Relationship status: Not on file  Other Topics Concern   Not on file  Social History Narrative   Patient lives at home with her daughter.    Patient has 2 children.    Patient is  Married.    Patient is right handed.    Patient has her Associates degree.       Review of Systems: A 12 point ROS discussed and pertinent positives are indicated in the HPI above.  All other systems are negative.  Review of Systems  Constitutional: Positive for activity change and fatigue. Negative for fever.  Respiratory: Positive for cough and shortness of breath.   Musculoskeletal: Positive for back pain.  Neurological: Positive for weakness.  Psychiatric/Behavioral: Negative for behavioral problems and confusion.    Vital Signs: BP (!) 98/54 (BP Location: Right Arm)    Pulse 85    Temp (!) 97.5 F (36.4 C) (Oral)    Resp 15    Ht  5\' 6"  (1.676 m)    Wt 168 lb 14 oz (76.6 kg)    SpO2 96%    BMI 27.26 kg/m   Physical Exam Vitals signs reviewed.  Cardiovascular:     Rate and Rhythm: Normal rate and regular rhythm.  Pulmonary:     Breath sounds: Examination of the left-upper field reveals decreased breath sounds and rales. Examination of the left-middle field reveals decreased breath sounds and rales. Examination of the left-lower field reveals decreased breath sounds and rales. Decreased breath sounds and rales present. No wheezing.  Abdominal:     Palpations: Abdomen is soft.  Musculoskeletal: Normal range of motion.  Skin:    General: Skin is warm and dry.  Neurological:     Mental Status: She is alert and oriented to person, place, and time.  Psychiatric:        Behavior: Behavior normal.     Imaging: Dg Chest 1 View  Result Date: 07/07/2018 CLINICAL DATA:  Post thoracentesis on the left. EXAM: CHEST  1 VIEW COMPARISON:  Radiographs 07/03/2018 and 06/24/2016.  CT 07/06/2018. FINDINGS: There has been partial re-expansion of the left lung following thoracentesis. There is still a sizable left pleural effusion with near complete whiteout of the left hemithorax. No residual mediastinal shift or pneumothorax identified. The right lung is clear. IMPRESSION: No pneumothorax  following thoracentesis on the left. Residual sizable left pleural effusion and left lung collapse. Electronically Signed   By: Richardean Sale M.D.   On: 07/07/2018 10:31   Dg Chest 2 View  Result Date: 07/08/2018 CLINICAL DATA:  Thoracentesis, left-sided chest pain EXAM: CHEST - 2 VIEW COMPARISON:  07/07/2018 FINDINGS: No significant change in a large left pleural effusion, with near-total consolidation of the left lung. There is no acute appearing airspace opacity of the aerated portion of the left pulmonary apex. No significant pneumothorax. The right lung is normally aerated. The cardiac silhouette is predominantly obscured. Disc degenerative disease of the thoracic spine IMPRESSION: No significant change in a large left pleural effusion, with near-total consolidation of the left lung. There is no acute appearing airspace opacity of the aerated portion of the left pulmonary apex. No significant pneumothorax. The right lung is normally aerated. Electronically Signed   By: Eddie Candle M.D.   On: 07/08/2018 09:37   Dg Chest Port 1 View  Result Date: 07/07/2018 CLINICAL DATA:  Left-sided pleural effusion status post thoracentesis EXAM: PORTABLE CHEST 1 VIEW COMPARISON:  July 07, 2018 FINDINGS: There is a persistent large left-sided pleural effusion. There is no pneumothorax. The heart is difficult to fully evaluate on this exam. The right lung field is mostly clear. There is some mild volume overload. IMPRESSION: Interval decrease in size of the previously demonstrated left-sided pleural effusion. There is a persistent large left-sided pleural effusion without evidence of a pneumothorax. Electronically Signed   By: Constance Holster M.D.   On: 07/07/2018 23:33   Ir Thoracentesis Asp Pleural Space W/img Guide  Result Date: 07/07/2018 INDICATION: Patient with history of migraines, tobacco abuse and new onset dyspnea with large left pleural effusion of uncertain etiology. Request for diagnostic and  therapeutic left side thoracentesis today in IR. EXAM: ULTRASOUND GUIDED LEFT THORACENTESIS MEDICATIONS: 13 mL 1% lidocaine COMPLICATIONS: None immediate. PROCEDURE: An ultrasound guided thoracentesis was thoroughly discussed with the patient and questions answered. The benefits, risks, alternatives and complications were also discussed. The patient understands and wishes to proceed with the procedure. Written consent was obtained. Ultrasound was performed to localize  and mark an adequate pocket of fluid in the left chest. The area was then prepped and draped in the normal sterile fashion. 1% Lidocaine was used for local anesthesia. Under ultrasound guidance a 6 Fr Safe-T-Centesis catheter was introduced. Thoracentesis was performed. The catheter was removed and a dressing applied. FINDINGS: A total of approximately 2.0 L of golden yellow, blood tinged fluid was removed. Samples were sent to the laboratory as requested by the clinical team. IMPRESSION: Successful ultrasound guided left thoracentesis yielding 2.0 L of pleural fluid. Read by Candiss Norse, PA-C Electronically Signed   By: Sandi Mariscal M.D.   On: 07/07/2018 10:36    Labs:  CBC: Recent Labs    07/06/18 1743 07/07/18 0010 07/08/18 0533  WBC 15.6* 17.9* 10.4  HGB 13.5 13.9 12.7  HCT 42.0 43.1 39.1  PLT 524* 498* 437*    COAGS: Recent Labs    07/07/18 0010  INR 1.0  APTT 30    BMP: Recent Labs    07/06/18 1743 07/07/18 0010 07/08/18 0533  NA 136 137 138  K 4.4 4.3 3.7  CL 102 100 103  CO2 22 25 25   GLUCOSE 142* 103* 92  BUN 11 10 9   CALCIUM 9.0 9.1 8.3*  CREATININE 0.75 0.72 0.64  GFRNONAA >60 >60 >60  GFRAA >60 >60 >60    LIVER FUNCTION TESTS: Recent Labs    07/07/18 0010 07/08/18 0533  BILITOT 0.6  --   AST 47*  --   ALT 81*  --   ALKPHOS 140*  --   PROT 7.0   6.9  --   ALBUMIN 3.3*   3.3* 2.3*    TUMOR MARKERS: No results for input(s): AFPTM, CEA, CA199, CHROMGRNA in the last 8760  hours.  Assessment and Plan:  Left persistent pleural effusion For chest tube drain placement in IR today Followed by TCTS Risks and benefits discussed with the patient including bleeding, infection, damage to adjacent structures, pneumothorax, and sepsis.  All of the patient's questions were answered, patient is agreeable to proceed. Consent signed and in chart.   Thank you for this interesting consult.  I greatly enjoyed meeting Michelle Holt and look forward to participating in their care.  A copy of this report was sent to the requesting provider on this date.  Electronically Signed: Lavonia Drafts, PA-C 07/08/2018, 10:04 AM   I spent a total of 40 Minutes    in face to face in clinical consultation, greater than 50% of which was counseling/coordinating care for left chest tube drain

## 2018-07-09 ENCOUNTER — Inpatient Hospital Stay (HOSPITAL_COMMUNITY): Payer: Medicaid Other

## 2018-07-09 LAB — RHEUMATOID FACTOR: Rheumatoid fact SerPl-aCnc: <10 IU/mL (ref 0.0–13.9)

## 2018-07-09 LAB — CHOLESTEROL, BODY FLUID: Cholesterol, Fluid: 126 mg/dL

## 2018-07-09 LAB — ACID FAST SMEAR (AFB, MYCOBACTERIA): Acid Fast Smear: NEGATIVE

## 2018-07-09 LAB — GLUCOSE, CAPILLARY: Glucose-Capillary: 98 mg/dL (ref 70–99)

## 2018-07-09 LAB — ANTINUCLEAR ANTIBODIES, IFA: ANA Ab, IFA: NEGATIVE

## 2018-07-09 MED ORDER — IOHEXOL 300 MG/ML  SOLN
75.0000 mL | Freq: Once | INTRAMUSCULAR | Status: AC | PRN
  Administered 2018-07-09: 75 mL via INTRAVENOUS

## 2018-07-09 MED ORDER — SODIUM CHLORIDE 0.9 % IV BOLUS
500.0000 mL | Freq: Once | INTRAVENOUS | Status: AC
  Administered 2018-07-09: 500 mL via INTRAVENOUS

## 2018-07-09 NOTE — Progress Notes (Signed)
Referring Physician(s): Dr. Prescott Gum  Supervising Physician: Markus Daft  Patient Status:  [facility] - In-pt  Chief Complaint: Follow up left pigtail chest tube placed 6/24 by Dr. Vernard Gambles  Subjective:  Patient sitting up in bed - reports severe pain at insertion site, states "it felt like I had labor pains all night." She states that her breathing as been pretty good. She states tube was clamped when she returned to the floor because of a large volume output. Per chart patient experienced hypotension requiring fluid bolus and chest tube was clamped to avoid further volume loss. She was seen by [facility] this morning who recommended removing clamp - tube unclamped during exam today. Patient states that she is nervous that she will need surgery and is hopeful that Dr. Prescott Gum will come by later to discuss this further.   Allergies: Morphine and related, Nortriptyline, Topamax [topiramate], Actifed cold-allergy [chlorpheniramine-phenyleph er], Amoxicillin, Codeine, Erythromycin, Penicillins, and Sudafed [pseudoephedrine hcl]  Medications: Prior to Admission medications   Medication Sig Start Date End Date Taking? Authorizing Provider  diazepam (VALIUM) 5 MG tablet TAKE ONE TABLET BY MOUTH AT BEDTIME AS NEEDED FOR ANXIETY Patient taking differently: Take 5 mg by mouth at bedtime as needed for anxiety.  02/11/18  Yes Kathrynn Ducking, MD  ibuprofen (ADVIL) 600 MG tablet TAKE ONE TABLET BY MOUTH EVERY 6 HOURS AS NEEDED. Patient taking differently: Take 600 mg by mouth every 6 (six) hours as needed for headache.  05/18/18  Yes Kathrynn Ducking, MD  oxyCODONE-acetaminophen (PERCOCET) 7.5-325 MG tablet Take 1 tablet by mouth every 6 (six) hours as needed. Patient taking differently: Take 1 tablet by mouth every 6 (six) hours as needed (migraine).  07/06/18  Yes Kathrynn Ducking, MD  predniSONE (DELTASONE) 20 MG tablet Take 10-40 mg by mouth See admin instructions. TAKE TWO (2) TABLETS BY MOUTH EVERY  MORNING FOR 5 DAYS, THEN ONE TABLET EVERY MORNING FOR FOUR DAYS, THEN ONE-HALF TABLET DAILY FOR FOUR DAYS, THEN STOP. 07/03/18  Yes [provider]  promethazine (PHENERGAN) 25 MG tablet Take 1 tablet (25 mg total) by mouth every 6 (six) hours as needed. Patient taking differently: Take 25 mg by mouth every 6 (six) hours as needed for nausea or vomiting.  02/11/18  Yes Kathrynn Ducking, MD  SUMAtriptan (IMITREX) 100 MG tablet TAKE ONE TABLET BY MOUTH PRN UP to TWICE DAILY AS NEEDED. Patient taking differently: Take 100 mg by mouth 2 (two) times daily as needed for migraine.  02/11/18  Yes Kathrynn Ducking, MD  traMADol (ULTRAM) 50 MG tablet Take 50 mg by mouth every 8 (eight) hours as needed for pain. 07/03/18  Yes [provider]     Vital Signs: BP 91/62 (BP Location: Left Arm)    Pulse 79    Temp 98 F (36.7 C) (Oral)    Resp (!) 21    Ht 5\' 6"  (1.676 m)    Wt 168 lb 14 oz (76.6 kg)    SpO2 96%    BMI 27.26 kg/m   Physical Exam Vitals signs and nursing note reviewed.  Constitutional:      General: She is not in acute distress. Cardiovascular:     Rate and Rhythm: Normal rate.  Pulmonary:     Effort: Pulmonary effort is normal.     Comments: Diminished breath sounds left lower, all other areas clear to auscultation. (+) left sided pigtail chest tube to pleurvac with ~160 cc serosanguineous output present.  Insertion site dressed appropriately clean, dry, intact without bleeding or discharge. Moderate pain on palpation.  Skin:    General: Skin is warm and dry.  Neurological:     Mental Status: She is alert. Mental status is at baseline.     Imaging: Dg Chest 1 View  Result Date: 07/07/2018 CLINICAL DATA:  Post thoracentesis on the left. EXAM: CHEST  1 VIEW COMPARISON:  Radiographs 07/03/2018 and 06/24/2016.  CT 07/06/2018. FINDINGS: There has been partial re-expansion of the left lung following thoracentesis. There is still a sizable left pleural effusion with near  complete whiteout of the left hemithorax. No residual mediastinal shift or pneumothorax identified. The right lung is clear. IMPRESSION: No pneumothorax following thoracentesis on the left. Residual sizable left pleural effusion and left lung collapse. Electronically Signed   By: Richardean Sale M.D.   On: 07/07/2018 10:31   Dg Chest 2 View  Result Date: 07/08/2018 CLINICAL DATA:  Thoracentesis, left-sided chest pain EXAM: CHEST - 2 VIEW COMPARISON:  07/07/2018 FINDINGS: No significant change in a large left pleural effusion, with near-total consolidation of the left lung. There is no acute appearing airspace opacity of the aerated portion of the left pulmonary apex. No significant pneumothorax. The right lung is normally aerated. The cardiac silhouette is predominantly obscured. Disc degenerative disease of the thoracic spine IMPRESSION: No significant change in a large left pleural effusion, with near-total consolidation of the left lung. There is no acute appearing airspace opacity of the aerated portion of the left pulmonary apex. No significant pneumothorax. The right lung is normally aerated. Electronically Signed   By: Eddie Candle M.D.   On: 07/08/2018 09:37   Dg Chest Port 1 View  Result Date: 07/09/2018 CLINICAL DATA:  Left pleural effusion.  Chest tube in place. EXAM: PORTABLE CHEST 1 VIEW COMPARISON:  PA and lateral chest 07/08/2018. FINDINGS: New right PICC is identified with its tip projecting at the superior cavoatrial junction. The patient also has a new pigtail catheter in the left chest. Small to moderate left pleural effusion is decreased compared to the prior examination. No pneumothorax. Left basilar airspace disease persists. Small focus of discoid atelectasis right mid lung noted. Heart size is upper normal. IMPRESSION: Decreased left pleural effusion with a new chest tube in place. Negative for pneumothorax. Electronically Signed   By: Inge Rise M.D.   On: 07/09/2018 09:14    Dg Chest Port 1 View  Result Date: 07/07/2018 CLINICAL DATA:  Left-sided pleural effusion status post thoracentesis EXAM: PORTABLE CHEST 1 VIEW COMPARISON:  July 07, 2018 FINDINGS: There is a persistent large left-sided pleural effusion. There is no pneumothorax. The heart is difficult to fully evaluate on this exam. The right lung field is mostly clear. There is some mild volume overload. IMPRESSION: Interval decrease in size of the previously demonstrated left-sided pleural effusion. There is a persistent large left-sided pleural effusion without evidence of a pneumothorax. Electronically Signed   By: Constance Holster M.D.   On: 07/07/2018 23:33   Ir Picc Placement Right >5 Yrs Inc Img Guide  Result Date: 07/08/2018 CLINICAL DATA:  Large left pleural effusion, poor IV access EXAM: PICC PLACEMENT WITH ULTRASOUND AND FLUOROSCOPY FLUOROSCOPY TIME:  18 seconds; 3 mGy TECHNIQUE: After written informed consent was obtained, patient was placed in the supine position on angiographic table. Patency of the right brachial vein was confirmed with ultrasound with image documentation. An appropriate skin site was determined. Skin site was marked. Region was prepped using maximum barrier  technique including cap and mask, sterile gown, sterile gloves, large sterile sheet, and Chlorhexidine as cutaneous antisepsis. The region was infiltrated locally with 1% lidocaine. Under real-time ultrasound guidance, the right brachial vein was accessed with a 21 gauge micropuncture needle; the needle tip within the vein was confirmed with ultrasound image documentation. Needle exchanged over a 018 guidewire for a peel-away sheath, through which a 5-French double-lumen power injectable PICC trimmed to 38cm was advanced, positioned with its tip near the cavoatrial junction. Spot chest radiograph confirms appropriate catheter position. Catheter was flushed per protocol and secured externally. The patient tolerated procedure well.  COMPLICATIONS: COMPLICATIONS none IMPRESSION: 1. Technically successful five [place] double lumen power injectable PICC placement Electronically Signed   By: Lucrezia Europe M.D.   On: 07/08/2018 16:40   Ir Thoracentesis Asp Pleural Space W/img Guide  Result Date: 07/07/2018 INDICATION: Patient with history of migraines, tobacco abuse and new onset dyspnea with large left pleural effusion of uncertain etiology. Request for diagnostic and therapeutic left side thoracentesis today in IR. EXAM: ULTRASOUND GUIDED LEFT THORACENTESIS MEDICATIONS: 13 mL 1% lidocaine COMPLICATIONS: None immediate. PROCEDURE: An ultrasound guided thoracentesis was thoroughly discussed with the patient and questions answered. The benefits, risks, alternatives and complications were also discussed. The patient understands and wishes to proceed with the procedure. Written consent was obtained. Ultrasound was performed to localize and mark an adequate pocket of fluid in the left chest. The area was then prepped and draped in the normal sterile fashion. 1% Lidocaine was used for local anesthesia. Under ultrasound guidance a 6 Fr Safe-T-Centesis catheter was introduced. Thoracentesis was performed. The catheter was removed and a dressing applied. FINDINGS: A total of approximately 2.0 L of golden yellow, blood tinged fluid was removed. Samples were sent to the laboratory as requested by the clinical team. IMPRESSION: Successful ultrasound guided left thoracentesis yielding 2.0 L of pleural fluid. Read by Candiss Norse, PA-C Electronically Signed   By: Sandi Mariscal M.D.   On: 07/07/2018 10:36   Ir Perc Pleural Drain W/[place] Cath W/img Guide  Result Date: 07/08/2018 CLINICAL DATA:  Large left pleural effusion EXAM: LEFT CHEST TUBE PLACEMENT UNDER ULTRASOUND AND FLUOROSCOPIC GUIDANCE ANESTHESIA/SEDATION: Intravenous Fentanyl 180mcg and Versed 4mg  were administered as conscious sedation during continuous monitoring of the patient's level of  consciousness and physiological / cardiorespiratory status by the radiology RN, with a total moderate sedation time of 49 minutes. PROCEDURE: The procedure, risks, benefits, and alternatives were explained to the patient. Questions regarding the procedure were encouraged and answered. The patient understands and consents to the procedure. appropriate skin entry site was determined with ultrasound and marked. The operative field was prepped with chlorhexidinein a sterile fashion, and a sterile drape was applied covering the operative field. A sterile gown and sterile gloves were used for the procedure. Local anesthesia was provided with 1% Lidocaine. Under ultrasound guidance, a 19 gauge percutaneous entry needle advanced into the left pleural space from a lateral approach. Blood-tinged fluid could be aspirated. Amplatz guidewire advanced towards the apex under fluoroscopy. Tract dilated to facilitate placement of 14 French pigtail catheter, directed towards the apex. Catheter secured externally with 0 Prolene suture and placed to Pleur-evac. 500 mL returned spontaneously into the canister without suction. Site was covered with a sterile dressing the patient returned to her room in good condition. COMPLICATIONS: None immediate FINDINGS: Large left pleural effusion was noted. 14 French pigtail catheter placed as above. IMPRESSION: 1. Technically successful placement of left 14 French pigtail chest drain,  placed to Pleur-evac water-seal. Electronically Signed   By: Lucrezia Europe M.D.   On: 07/08/2018 16:42    Labs:  CBC: Recent Labs    07/06/18 1743 07/07/18 0010 07/08/18 0533  WBC 15.6* 17.9* 10.4  HGB 13.5 13.9 12.7  HCT 42.0 43.1 39.1  PLT 524* 498* 437*    COAGS: Recent Labs    07/07/18 0010  INR 1.0  APTT 30    BMP: Recent Labs    07/06/18 1743 07/07/18 0010 07/08/18 0533  NA 136 137 138  K 4.4 4.3 3.7  CL 102 100 103  CO2 22 25 25   GLUCOSE 142* 103* 92  BUN 11 10 9   CALCIUM 9.0  9.1 8.3*  CREATININE 0.75 0.72 0.64  GFRNONAA >60 >60 >60  GFRAA >60 >60 >60    LIVER FUNCTION TESTS: Recent Labs    07/07/18 0010 07/08/18 0533  BILITOT 0.6  --   AST 47*  --   ALT 81*  --   ALKPHOS 140*  --   PROT 7.0   6.9  --   ALBUMIN 3.3*   3.3* 2.3*    Assessment and Plan:  48 y/o F s/p left pigtail chest tube placement 6/24 for recurrent pleural effusion. Patient previously underwent 2 L thoracentesis in IR on 6/23 with significant pleural fluid remaining following procedure. Labs show effusion is exudative in nature. [facility] is following closely, considering VATS and has ordered CT chest with contrast which shows small complex left hydropneumothorax, small loculated pleural component posteriorly.   Per [facility] note from this morning still considering VATS based on results of CT scan today. Continue current management. IR will continue to follow along.   Please call with questions or concerns.    Electronically Signed: Joaquim Nam, PA-C 07/09/2018, 10:55 AM   I spent a total of 15 Minutes at the the patient's bedside AND on the patient's hospital floor or unit, greater than 50% of which was counseling/coordinating care for left chest tube follow up.

## 2018-07-09 NOTE — Progress Notes (Signed)
Name: Michelle Holt MRN: [id number] DOB: Jun 12, 1970    ADMISSION DATE:  07/06/2018 CONSULTATION DATE: 07/08/2018  REFERRING MD : [facility]  CHIEF COMPLAINT: Shortness of breath chest pain  BRIEF PATIENT DESCRIPTION: 48 year old female no acute distress  SIGNIFICANT EVENTS  07/07/2018 left thoracentesis with 2 L of golden fluid obtained. Pleural fluid shows an LDH of 1381 total protein of 4.1 and amylase of 42 and albumin of 2.2.  Blood LDH is 193 blood total protein is 69 but albumin is 2.3.  Cytology and culture data is pending 07/08/2018 placement of chest tube per interventional radiology  STUDIES:  622 CT of the chest shows large left pleural effusion with mediastinal shift to the right and inversion of the left hemidiaphragm.  Nonspecific patchy groundglass opacities in the right lung without thoracic adenopathy   HISTORY OF PRESENT ILLNESS:   48 year old female is been a smoker since age 89 with approximately half pack per day and notes approximately 1 week of increasing shortness of breath.  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  She had increasing shortness of breath raising pain on the right and sought medical attention and was found to have a large left pleural effusion.  07/07/2018 she underwent thoracentesis with 2 L removed with sizable pleural effusion remaining.  She had a cardiovascular thoracic surgery evaluation suggested possible pigtail catheter and/or VATS.  The readings of the pleural fluid analysis consistent with exudative pleural effusion.  She denies fevers chills sweats or purulent sputum.  She has developed a cough that with clear fluid.  Denies exposure to COVID patients and recover tested on 07/06/2018 was negative. Pulmonary critical care asked to evaluate on 07/08/2018.   SUBJECTIVE:  Reports continued pain in his  insertion site of left chest tube. VITAL SIGNS: Temp:  [95.4 F (35.2 C)-98.4 F (36.9 C)] 98 F (36.7 C) (06/25 0759) Pulse Rate:  [54-102] 79 (06/25 0759) Resp:  [14-28] 21 (06/25 0759) BP: (82-125)/(30-71) 91/62 (06/25 0759) SpO2:  [91 %-98 %] 96 % (06/25 0759)  PHYSICAL EXAMINATION: General: Well-nourished well-developed female who complains of left pleural chest pain HEENT: No JVD or lymphadenopathy is appreciated Neuro: Dull effect somewhat lethargic but no overt deficiencies CV: s1s2 rrr, no m/r/g PULM: even/non-labored, lungs bilaterally diminished in the left chest.  Left chest tube currently clamped. HQ:PRFF, non-tender, bsx4 active  Extremities: warm/dry, negative edema  Skin: no rashes or lesions   Recent Labs  Lab 07/06/18 1743 07/07/18 0010 07/08/18 0533  NA 136 137 138  K 4.4 4.3 3.7  CL 102 100 103  CO2 22 25 25   BUN 11 10 9   CREATININE 0.75 0.72 0.64  GLUCOSE 142* 103* 92   Recent Labs  Lab 07/06/18 1743 07/07/18 0010 07/08/18 0533  HGB 13.5 13.9 12.7  HCT 42.0 43.1 39.1  WBC 15.6* 17.9* 10.4  PLT 524* 498* 437*   Dg Chest 1 View  Result Date: 07/07/2018 CLINICAL DATA:  Post thoracentesis on the left. EXAM: CHEST  1 VIEW COMPARISON:  Radiographs 07/03/2018 and 06/24/2016.  CT 07/06/2018. FINDINGS: There has been partial re-expansion of the left lung following thoracentesis. There is still a sizable left pleural effusion with near complete whiteout of the left hemithorax. No residual mediastinal shift or pneumothorax identified. The right lung is clear. IMPRESSION: No pneumothorax following thoracentesis on the left.  Residual sizable left pleural effusion and left lung collapse. Electronically Signed   By: Richardean Sale M.D.   On: 07/07/2018 10:31   Dg Chest 2 View  Result Date: 07/08/2018 CLINICAL DATA:  Thoracentesis, left-sided chest pain EXAM: CHEST - 2 VIEW COMPARISON:  07/07/2018 FINDINGS: No significant change in a large left pleural  effusion, with near-total consolidation of the left lung. There is no acute appearing airspace opacity of the aerated portion of the left pulmonary apex. No significant pneumothorax. The right lung is normally aerated. The cardiac silhouette is predominantly obscured. Disc degenerative disease of the thoracic spine IMPRESSION: No significant change in a large left pleural effusion, with near-total consolidation of the left lung. There is no acute appearing airspace opacity of the aerated portion of the left pulmonary apex. No significant pneumothorax. The right lung is normally aerated. Electronically Signed   By: Eddie Candle M.D.   On: 07/08/2018 09:37   Dg Chest Port 1 View  Result Date: 07/07/2018 CLINICAL DATA:  Left-sided pleural effusion status post thoracentesis EXAM: PORTABLE CHEST 1 VIEW COMPARISON:  July 07, 2018 FINDINGS: There is a persistent large left-sided pleural effusion. There is no pneumothorax. The heart is difficult to fully evaluate on this exam. The right lung field is mostly clear. There is some mild volume overload. IMPRESSION: Interval decrease in size of the previously demonstrated left-sided pleural effusion. There is a persistent large left-sided pleural effusion without evidence of a pneumothorax. Electronically Signed   By: Constance Holster M.D.   On: 07/07/2018 23:33   Ir Picc Placement Right <5 [place] Img Guide  Result Date: 07/08/2018 CLINICAL DATA:  Large left pleural effusion, poor IV access EXAM: PICC PLACEMENT WITH ULTRASOUND AND FLUOROSCOPY FLUOROSCOPY TIME:  18 seconds; 3 mGy TECHNIQUE: After written informed consent was obtained, patient was placed in the supine position on angiographic table. Patency of the right brachial vein was confirmed with ultrasound with image documentation. An appropriate skin site was determined. Skin site was marked. Region was prepped using maximum barrier technique including cap and mask, sterile gown, sterile gloves, large sterile  sheet, and Chlorhexidine as cutaneous antisepsis. The region was infiltrated locally with 1% lidocaine. Under real-time ultrasound guidance, the right brachial vein was accessed with a 21 gauge micropuncture needle; the needle tip within the vein was confirmed with ultrasound image documentation. Needle exchanged over a 018 guidewire for a peel-away sheath, through which a 5-French double-lumen power injectable PICC trimmed to 38cm was advanced, positioned with its tip near the cavoatrial junction. Spot chest radiograph confirms appropriate catheter position. Catheter was flushed per protocol and secured externally. The patient tolerated procedure well. COMPLICATIONS: COMPLICATIONS none IMPRESSION: 1. Technically successful five [place] double lumen power injectable PICC placement Electronically Signed   By: Lucrezia Europe M.D.   On: 07/08/2018 16:40   Ir Thoracentesis Asp Pleural Space W/img Guide  Result Date: 07/07/2018 INDICATION: Patient with history of migraines, tobacco abuse and new onset dyspnea with large left pleural effusion of uncertain etiology. Request for diagnostic and therapeutic left side thoracentesis today in IR. EXAM: ULTRASOUND GUIDED LEFT THORACENTESIS MEDICATIONS: 13 mL 1% lidocaine COMPLICATIONS: None immediate. PROCEDURE: An ultrasound guided thoracentesis was thoroughly discussed with the patient and questions answered. The benefits, risks, alternatives and complications were also discussed. The patient understands and wishes to proceed with the procedure. Written consent was obtained. Ultrasound was performed to localize and mark an adequate pocket of fluid in the left chest. The area was then prepped and  draped in the normal sterile fashion. 1% Lidocaine was used for local anesthesia. Under ultrasound guidance a 6 Fr Safe-T-Centesis catheter was introduced. Thoracentesis was performed. The catheter was removed and a dressing applied. FINDINGS: A total of approximately 2.0 L of golden  yellow, blood tinged fluid was removed. Samples were sent to the laboratory as requested by the clinical team. IMPRESSION: Successful ultrasound guided left thoracentesis yielding 2.0 L of pleural fluid. Read by Candiss Norse, PA-C Electronically Signed   By: Sandi Mariscal M.D.   On: 07/07/2018 10:36   Ir Perc Pleural Drain W/[place] Cath W/img Guide  Result Date: 07/08/2018 CLINICAL DATA:  Large left pleural effusion EXAM: LEFT CHEST TUBE PLACEMENT UNDER ULTRASOUND AND FLUOROSCOPIC GUIDANCE ANESTHESIA/SEDATION: Intravenous Fentanyl 142mcg and Versed 4mg  were administered as conscious sedation during continuous monitoring of the patient's level of consciousness and physiological / cardiorespiratory status by the radiology RN, with a total moderate sedation time of 49 minutes. PROCEDURE: The procedure, risks, benefits, and alternatives were explained to the patient. Questions regarding the procedure were encouraged and answered. The patient understands and consents to the procedure. appropriate skin entry site was determined with ultrasound and marked. The operative field was prepped with chlorhexidinein a sterile fashion, and a sterile drape was applied covering the operative field. A sterile gown and sterile gloves were used for the procedure. Local anesthesia was provided with 1% Lidocaine. Under ultrasound guidance, a 19 gauge percutaneous entry needle advanced into the left pleural space from a lateral approach. Blood-tinged fluid could be aspirated. Amplatz guidewire advanced towards the apex under fluoroscopy. Tract dilated to facilitate placement of 14 French pigtail catheter, directed towards the apex. Catheter secured externally with 0 Prolene suture and placed to Pleur-evac. 500 mL returned spontaneously into the canister without suction. Site was covered with a sterile dressing the patient returned to her room in good condition. COMPLICATIONS: None immediate FINDINGS: Large left pleural effusion was  noted. 14 French pigtail catheter placed as above. IMPRESSION: 1. Technically successful placement of left 14 French pigtail chest drain, placed to Pleur-evac water-seal. Electronically Signed   By: Lucrezia Europe M.D.   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  Without fevers chills sweats or purulent sputum production.  No exposure to infected personnel.  Status post thoracentesis on the left 2 L removed on 07/07/2018 significant pleural fluid remaining performed per interventional radiology.  Status post CVTS evaluation the possible need for VATS again on 07/07/2018.  Further discussion for possible pigtail placement per interventional radiology. Pulmonary consulted on 07/08/2018 for further evaluation and treatment of pleural effusion. 07/08/2018 placement of left pigtail catheter per interventional radiology was additional 500 cc of pleural fluid removed. 07/08/2018 hypotension post placement of chest tube chest tube clamped normal saline 500 cc given. 07/09/2018 chest tube remains clamped no documentation of drainage is been placed in chart at this time concerning chest tube drainage. 07/09/2018 portable chest x-ray reveals pigtail catheter in place decreased in pleural effusion but with significant pleural effusion left remains.     PLAN:  Continue to leave chest tube clamped at this time on 07/09/2018 500 cc of normal saline bolus x1 07/09/2018 Await results of cytology  Discussion with cardiovascular thoracic surgeon team concerning management of chest tube. Strong suspicion of underlying process creating diffusion she will need follow-up CT of the chest once drainage of effusion is complete  Pain management.   All other issues per primary team.   Richardson Landry Minor ACNP [facility] PCCM Pager [phone] till 1 pm If no answer page [phone] 07/09/2018, 8:06 AM

## 2018-07-09 NOTE — Progress Notes (Signed)
PROGRESS NOTE    TECKLA CHRISTIANSEN  KDX:[id number] DOB: 03-Jan-1971 DOA: 07/06/2018 PCP: [name], MD  Outpatient Specialists:   Brief Narrative:  As per he H&P "SACHIKO METHOT is a 48 y.o. female with medical history significant of depression with anxiety, insomnia, IBS, migraine headache, tobacco abuse, who presents with shortness of breath.  Pt states she inhaled some chemicals while cleaning the bathroom approximately one week ago. Pt states she began to feel SOB. She went to the doctor's office and was then diagnosed with a pleural effusion and possible pneumothorax. States she became increasingly SOB today. She was seen by PCP again and had CT. It "Read from outside hospital says "very large, simple appearing left pleural effusion with mediastinal shift to the right and inversion of the left hemidiaphragm.The left lung remains completely collapsed."    Patient has mild dry cough, no fever or chills.  Denies chest pain.  No runny nose or sore throat.  She states that she has chronic back pain.  No nausea vomiting, diarrhea, abdominal pain, symptoms of UTI or unilateral weakness. Patient ahs oxygen desaturation to 91% on room air, which improved to 98% 3 L nasal cannula oxygen in ED.  ED Course: pt was found to have pending Covid19 test, WBC 15.6, negative troponin, electrolytes renal function okay, pending the repeated CXR. Pt is placed on SDU for obs. CT surgeon, Dr. Prescott Gum was consulted by EDP".  07/07/2018: Patient underwent left-sided thoracentesis by the interventional radiology team.  Thoracentesis yielded 2 L of golden yellow, slightly blood-tinged fluid.  Postthoracentesis chest x-ray is noted.  Will start patient on incentive spirometry and flutter valve.  Will repeat chest x-ray tomorrow.  Patient may need repeat thoracentesis.  Will follow pleural fluid analysis.  Patient feels a lot better after the thoracentesis.  No fever or chills.  No headache or neck pain.  Reported an  episode of thick yellowish sputum earlier today.  Will culture sputum if not already done.  Patient was seen alongside patient's nurse.  07/09/2018: Patient seen.  Pleural fluid analysis is exudative.  Pigtail catheter has been placed.  Chest tube was clamped yesterday as patient was hypotensive, but responded to volume resuscitation.  Currently, the clamp has been released.  Cardiothoracic and pulmonary teams input is highly appreciated.  Cardiothoracic team is still considering possibility of VATS if pleural effusion persist.  No fever or chills.  Shortness of breath has improved significantly.    Assessment & Plan:   Principal Problem:   Pleural effusion on left Active Problems:   Generalized anxiety disorder   Migraine   Acute respiratory failure with hypoxia (HCC)   Tobacco abuse   Pleural effusion  Acute respiratory failure with hypoxia due to large pleural effusion on left:  CT showed "very large, simple appearing left pleural effusion with mediastinal shift to the right and inversion of the left hemidiaphragm.The left lung remains completely collapsed."  Etiology is not clear.  Differential diagnosis include malignancy, CHF and pleurisy. Patient denies chest pain which is not consistent with pleurisy. No fever, but has leukocytosis with WBC 15.6.  Will start broad antibiotics. CT surgeon, Dr. Prescott Gum was consulted by EDP for possible thoracentesis.  -will place on stepdown bed for observation - Nasal cannula oxygen to maintain oxygen saturation above 93% -As needed Atrovent inhaler for shortness of breath and Mucinex for cough - vancomycin and Aztrenam - blood culture -pleural fluid test analysis orders were put in -check INR/PTT - check  BNP -f/u Ct surgeon's recommendations 07/07/2018: See above documentation.  Patient has undergone left-sided thoracentesis.  Patient seems to be improving.  Will follow pleural fluid analysis.  Further management will depend on hospital  course. 07/08/2018: Pigtail catheter placed. 07/09/2018: Patient seen.  Shortness of breath has improved.  Blood pressure is more stable.  Further management will depend on hospital course.  Generalized anxiety disorder: -continue home prn valium   Migraine: -prn Imitrex  Tobacco abuse:  Counseled.    DVT ppx: SCD Code Status: Full code Family Communication: .     Disposition Plan:   Home Consults called: CT surgeon, Dr. Prescott Gum was consult by EDP.  Functional radiology team consulted for thoracentesis.  Procedures:   Left-sided thoracentesis.  Antimicrobials:   IV vancomycin  IV aztreonam has been discontinued.  IV cefepime started.   Subjective: Shortness of breath has improved significantly. Reports pain around where a pigtail catheter was placed. No fever or chills.  Objective: Vitals:   07/09/18 0529 07/09/18 0608 07/09/18 0759 07/09/18 0818  BP: 96/61 97/63 91/62    Pulse: 88 81 79   Resp: (!) 28 15 (!) 21   Temp:   98 F (36.7 C)   TempSrc:   Oral   SpO2: 95% 95% 96% 96%  Weight:      Height:        Intake/Output Summary (Last 24 hours) at 07/09/2018 1005 Last data filed at 07/08/2018 1800 Gross per 24 hour  Intake 303.05 ml  Output 1800 ml  Net -1496.95 ml   Filed Weights   07/06/18 2219 07/06/18 2343  Weight: 74.8 kg 76.6 kg    Examination:  General exam: Appears calm and comfortable  Respiratory system: Decreased air entry left lung field. Cardiovascular system: S1 & S2. No pedal edema. Gastrointestinal system: Abdomen is nondistended, soft and nontender. No organomegaly or masses felt. Normal bowel sounds heard. Central nervous system: Alert and oriented. No focal neurological deficits. Extremities: No leg edema.  Data Reviewed: I have personally reviewed following labs and imaging studies  CBC: Recent Labs  Lab 07/06/18 1743 07/07/18 0010 07/08/18 0533  WBC 15.6* 17.9* 10.4  NEUTROABS  --   --  7.5  HGB 13.5 13.9 12.7   HCT 42.0 43.1 39.1  MCV 101.2* 100.7* 99.7  PLT 524* 498* 353*   Basic Metabolic Panel: Recent Labs  Lab 07/06/18 1743 07/07/18 0010 07/08/18 0533  NA 136 137 138  K 4.4 4.3 3.7  CL 102 100 103  CO2 22 25 25   GLUCOSE 142* 103* 92  BUN 11 10 9   CREATININE 0.75 0.72 0.64  CALCIUM 9.0 9.1 8.3*  MG  --   --  1.9  PHOS  --   --  3.6   GFR: Estimated Creatinine Clearance: 89.9 mL/min (by C-G formula based on SCr of 0.64 mg/dL). Liver Function Tests: Recent Labs  Lab 07/07/18 0010 07/08/18 0533  AST 47*  --   ALT 81*  --   ALKPHOS 140*  --   BILITOT 0.6  --   PROT 7.0   6.9  --   ALBUMIN 3.3*   3.3* 2.3*   No results for input(s): LIPASE, AMYLASE in the last 168 hours. No results for input(s): AMMONIA in the last 168 hours. Coagulation Profile: Recent Labs  Lab 07/07/18 0010  INR 1.0   Cardiac Enzymes: Recent Labs  Lab 07/06/18 1743  TROPONINI <0.03   BNP (last 3 results) No results for input(s): PROBNP in the last  8760 hours. HbA1C: No results for input(s): HGBA1C in the last 72 hours. CBG: Recent Labs  Lab 07/07/18 0722 07/08/18 0745 07/09/18 0758  GLUCAP 86 85 98   Lipid Profile: No results for input(s): CHOL, HDL, LDLCALC, TRIG, CHOLHDL, LDLDIRECT in the last 72 hours. Thyroid Function Tests: Recent Labs    07/08/18 1830  TSH 1.492   Anemia Panel: No results for input(s): VITAMINB12, FOLATE, FERRITIN, TIBC, IRON, RETICCTPCT in the last 72 hours. Urine analysis: No results found for: COLORURINE, APPEARANCEUR, LABSPEC, PHURINE, GLUCOSEU, HGBUR, BILIRUBINUR, KETONESUR, PROTEINUR, UROBILINOGEN, NITRITE, LEUKOCYTESUR Sepsis Labs: @LABRCNTIP (procalcitonin:4,lacticidven:4)  ) Recent Results (from the past 240 hour(s))  SARS Coronavirus 2 (CEPHEID- Performed in [facility] hospital lab), Hosp Order     Status: None   Collection Time: 07/06/18  8:37 PM   Specimen: Nasopharyngeal Swab  Result Value Ref Range Status   SARS Coronavirus 2 NEGATIVE  NEGATIVE Final    Comment: (NOTE) If result is NEGATIVE SARS-CoV-2 target nucleic acids are NOT DETECTED. The SARS-CoV-2 RNA is generally detectable in upper and lower  respiratory specimens during the acute phase of infection. The lowest  concentration of SARS-CoV-2 viral copies this assay can detect is 250  copies / mL. A negative result does not preclude SARS-CoV-2 infection  and should not be used as the sole basis for treatment or other  patient management decisions.  A negative result may occur with  improper specimen collection / handling, submission of specimen other  than nasopharyngeal swab, presence of viral mutation(s) within the  areas targeted by this assay, and inadequate number of viral copies  (<250 copies / mL). A negative result must be combined with clinical  observations, patient history, and epidemiological information. If result is POSITIVE SARS-CoV-2 target nucleic acids are DETECTED. The SARS-CoV-2 RNA is generally detectable in upper and lower  respiratory specimens dur ing the acute phase of infection.  Positive  results are indicative of active infection with SARS-CoV-2.  Clinical  correlation with patient history and other diagnostic information is  necessary to determine patient infection status.  Positive results do  not rule out bacterial infection or co-infection with other viruses. If result is PRESUMPTIVE POSTIVE SARS-CoV-2 nucleic acids MAY BE PRESENT.   A presumptive positive result was obtained on the submitted specimen  and confirmed on repeat testing.  While 2019 novel coronavirus  (SARS-CoV-2) nucleic acids may be present in the submitted sample  additional confirmatory testing may be necessary for epidemiological  and / or clinical management purposes  to differentiate between  SARS-CoV-2 and other Sarbecovirus currently known to infect humans.  If clinically indicated additional testing with an alternate test  methodology [phone]) is  advised. The SARS-CoV-2 RNA is generally  detectable in upper and lower respiratory sp ecimens during the acute  phase of infection. The expected result is Negative. Fact Sheet for Patients:  StrictlyIdeas.no Fact Sheet for Healthcare Providers: BankingDealers.co.za This test is not yet approved or cleared by the [place] FDA and has been authorized for detection and/or diagnosis of SARS-CoV-2 by FDA under an Emergency Use Authorization (EUA).  This EUA will remain in effect (meaning this test can be used) for the duration of the COVID-19 declaration under Section 564(b)(1) of the Act, 21 U.S.C. section 360bbb-3(b)(1), unless the authorization is terminated or revoked sooner. Performed at [facility], [place] [address], [place], [place] [address]   Culture, blood (Routine X 2) w Reflex to ID Panel     Status: None (Preliminary  result)   Collection Time: 07/07/18 12:05 AM   Specimen: BLOOD  Result Value Ref Range Status   Specimen Description BLOOD RIGHT ARM  Final   Special Requests   Final    BOTTLES DRAWN AEROBIC ONLY Blood Culture adequate volume   Culture   Final    NO GROWTH 2 DAYS Performed at [facility], [address], [place], [place] [address]    Report Status PENDING  Incomplete  Culture, blood (Routine X 2) w Reflex to ID Panel     Status: None (Preliminary result)   Collection Time: 07/07/18 12:10 AM   Specimen: BLOOD  Result Value Ref Range Status   Specimen Description BLOOD RIGHT ARM  Final   Special Requests   Final    BOTTLES DRAWN AEROBIC ONLY Blood Culture adequate volume   Culture   Final    NO GROWTH 2 DAYS Performed at [facility], [place] [address] [place], [place] [address]    Report Status PENDING  Incomplete  MRSA PCR Screening     Status: None   Collection Time: 07/07/18  1:42 AM   Specimen: Nasal Mucosa; Nasopharyngeal  Result Value Ref Range Status   MRSA by PCR NEGATIVE  NEGATIVE Final    Comment:        The GeneXpert MRSA Assay (FDA approved for NASAL specimens only), is one component of a comprehensive MRSA colonization surveillance program. It is not intended to diagnose MRSA infection nor to guide or monitor treatment for MRSA infections. Performed at [facility], [place] [address], [place], [place] [address]   Culture, body fluid-bottle     Status: None (Preliminary result)   Collection Time: 07/07/18 10:10 AM   Specimen: Pleura  Result Value Ref Range Status   Specimen Description PLEURAL LEFT  Final   Special Requests NONE  Final   Culture   Final    NO GROWTH 2 DAYS Performed at [place] [address], [place], [place] [address]    Report Status PENDING  Incomplete  Gram stain     Status: None   Collection Time: 07/07/18 10:10 AM   Specimen: Pleura  Result Value Ref Range Status   Specimen Description PLEURAL LEFT  Final   Special Requests NONE  Final   Gram Stain   Final    ABUNDANT WBC PRESENT,BOTH PMN AND MONONUCLEAR NO ORGANISMS SEEN Performed at [facility], [address], [place], [place] [address]    Report Status 07/07/2018 FINAL  Final  Expectorated sputum assessment w rflx to resp cult     Status: None   Collection Time: 07/07/18  5:55 PM   Specimen: Expectorated Sputum  Result Value Ref Range Status   Specimen Description EXPECTORATED SPUTUM  Final   Special Requests NONE  Final   Sputum evaluation   Final    THIS SPECIMEN IS ACCEPTABLE FOR SPUTUM CULTURE Performed at [facility], [place] [address] [place], [place] [address]    Report Status 07/07/2018 FINAL  Final  Culture, respiratory     Status: None (Preliminary result)   Collection Time: 07/07/18  5:55 PM  Result Value Ref Range Status   Specimen Description EXPECTORATED SPUTUM  Final   Special Requests NONE Reflexed from [id number]  Final   Gram Stain   Final    ABUNDANT WBC PRESENT, PREDOMINANTLY PMN FEW GRAM POSITIVE COCCI     Culture   Final    CULTURE REINCUBATED  FOR BETTER GROWTH Performed at [facility], [place] [address] [place], [place] [address]    Report Status PENDING  Incomplete         Radiology Studies: Dg Chest 1 View  Result Date: 07/07/2018 CLINICAL DATA:  Post thoracentesis on the left. EXAM: CHEST  1 VIEW COMPARISON:  Radiographs 07/03/2018 and 06/24/2016.  CT 07/06/2018. FINDINGS: There has been partial re-expansion of the left lung following thoracentesis. There is still a sizable left pleural effusion with near complete whiteout of the left hemithorax. No residual mediastinal shift or pneumothorax identified. The right lung is clear. IMPRESSION: No pneumothorax following thoracentesis on the left. Residual sizable left pleural effusion and left lung collapse. Electronically Signed   By: Richardean Sale M.D.   On: 07/07/2018 10:31   Dg Chest 2 View  Result Date: 07/08/2018 CLINICAL DATA:  Thoracentesis, left-sided chest pain EXAM: CHEST - 2 VIEW COMPARISON:  07/07/2018 FINDINGS: No significant change in a large left pleural effusion, with near-total consolidation of the left lung. There is no acute appearing airspace opacity of the aerated portion of the left pulmonary apex. No significant pneumothorax. The right lung is normally aerated. The cardiac silhouette is predominantly obscured. Disc degenerative disease of the thoracic spine IMPRESSION: No significant change in a large left pleural effusion, with near-total consolidation of the left lung. There is no acute appearing airspace opacity of the aerated portion of the left pulmonary apex. No significant pneumothorax. The right lung is normally aerated. Electronically Signed   By: Eddie Candle M.D.   On: 07/08/2018 09:37   Dg Chest Port 1 View  Result Date: 07/09/2018 CLINICAL DATA:  Left pleural effusion.  Chest tube in place. EXAM: PORTABLE CHEST 1 VIEW COMPARISON:  PA and lateral chest 07/08/2018. FINDINGS: New right PICC is identified  with its tip projecting at the superior cavoatrial junction. The patient also has a new pigtail catheter in the left chest. Small to moderate left pleural effusion is decreased compared to the prior examination. No pneumothorax. Left basilar airspace disease persists. Small focus of discoid atelectasis right mid lung noted. Heart size is upper normal. IMPRESSION: Decreased left pleural effusion with a new chest tube in place. Negative for pneumothorax. Electronically Signed   By: Inge Rise M.D.   On: 07/09/2018 09:14   Dg Chest Port 1 View  Result Date: 07/07/2018 CLINICAL DATA:  Left-sided pleural effusion status post thoracentesis EXAM: PORTABLE CHEST 1 VIEW COMPARISON:  July 07, 2018 FINDINGS: There is a persistent large left-sided pleural effusion. There is no pneumothorax. The heart is difficult to fully evaluate on this exam. The right lung field is mostly clear. There is some mild volume overload. IMPRESSION: Interval decrease in size of the previously demonstrated left-sided pleural effusion. There is a persistent large left-sided pleural effusion without evidence of a pneumothorax. Electronically Signed   By: Constance Holster M.D.   On: 07/07/2018 23:33   Ir Picc Placement Right >5 Yrs Inc Img Guide  Result Date: 07/08/2018 CLINICAL DATA:  Large left pleural effusion, poor IV access EXAM: PICC PLACEMENT WITH ULTRASOUND AND FLUOROSCOPY FLUOROSCOPY TIME:  18 seconds; 3 mGy TECHNIQUE: After written informed consent was obtained, patient was placed in the supine position on angiographic table. Patency of the right brachial vein was confirmed with ultrasound with image documentation. An appropriate skin site was determined. Skin site was marked. Region was prepped using maximum barrier technique including cap and mask, sterile gown, sterile gloves, large sterile sheet, and Chlorhexidine  as cutaneous antisepsis. The region was infiltrated locally with 1% lidocaine. Under real-time ultrasound  guidance, the right brachial vein was accessed with a 21 gauge micropuncture needle; the needle tip within the vein was confirmed with ultrasound image documentation. Needle exchanged over a 018 guidewire for a peel-away sheath, through which a 5-French double-lumen power injectable PICC trimmed to 38cm was advanced, positioned with its tip near the cavoatrial junction. Spot chest radiograph confirms appropriate catheter position. Catheter was flushed per protocol and secured externally. The patient tolerated procedure well. COMPLICATIONS: COMPLICATIONS none IMPRESSION: 1. Technically successful five [place] double lumen power injectable PICC placement Electronically Signed   By: Lucrezia Europe M.D.   On: 07/08/2018 16:40   Ir Thoracentesis Asp Pleural Space W/img Guide  Result Date: 07/07/2018 INDICATION: Patient with history of migraines, tobacco abuse and new onset dyspnea with large left pleural effusion of uncertain etiology. Request for diagnostic and therapeutic left side thoracentesis today in IR. EXAM: ULTRASOUND GUIDED LEFT THORACENTESIS MEDICATIONS: 13 mL 1% lidocaine COMPLICATIONS: None immediate. PROCEDURE: An ultrasound guided thoracentesis was thoroughly discussed with the patient and questions answered. The benefits, risks, alternatives and complications were also discussed. The patient understands and wishes to proceed with the procedure. Written consent was obtained. Ultrasound was performed to localize and mark an adequate pocket of fluid in the left chest. The area was then prepped and draped in the normal sterile fashion. 1% Lidocaine was used for local anesthesia. Under ultrasound guidance a 6 Fr Safe-T-Centesis catheter was introduced. Thoracentesis was performed. The catheter was removed and a dressing applied. FINDINGS: A total of approximately 2.0 L of golden yellow, blood tinged fluid was removed. Samples were sent to the laboratory as requested by the clinical team. IMPRESSION: Successful  ultrasound guided left thoracentesis yielding 2.0 L of pleural fluid. Read by Candiss Norse, PA-C Electronically Signed   By: Sandi Mariscal M.D.   On: 07/07/2018 10:36   Ir Perc Pleural Drain W/[place] Cath W/img Guide  Result Date: 07/08/2018 CLINICAL DATA:  Large left pleural effusion EXAM: LEFT CHEST TUBE PLACEMENT UNDER ULTRASOUND AND FLUOROSCOPIC GUIDANCE ANESTHESIA/SEDATION: Intravenous Fentanyl 158mcg and Versed 4mg  were administered as conscious sedation during continuous monitoring of the patient's level of consciousness and physiological / cardiorespiratory status by the radiology RN, with a total moderate sedation time of 49 minutes. PROCEDURE: The procedure, risks, benefits, and alternatives were explained to the patient. Questions regarding the procedure were encouraged and answered. The patient understands and consents to the procedure. appropriate skin entry site was determined with ultrasound and marked. The operative field was prepped with chlorhexidinein a sterile fashion, and a sterile drape was applied covering the operative field. A sterile gown and sterile gloves were used for the procedure. Local anesthesia was provided with 1% Lidocaine. Under ultrasound guidance, a 19 gauge percutaneous entry needle advanced into the left pleural space from a lateral approach. Blood-tinged fluid could be aspirated. Amplatz guidewire advanced towards the apex under fluoroscopy. Tract dilated to facilitate placement of 14 French pigtail catheter, directed towards the apex. Catheter secured externally with 0 Prolene suture and placed to Pleur-evac. 500 mL returned spontaneously into the canister without suction. Site was covered with a sterile dressing the patient returned to her room in good condition. COMPLICATIONS: None immediate FINDINGS: Large left pleural effusion was noted. 14 French pigtail catheter placed as above. IMPRESSION: 1. Technically successful placement of left 14 French pigtail chest  drain, placed to Pleur-evac water-seal. Electronically Signed   By: Eden Emms.D.  On: 07/08/2018 16:42        Scheduled Meds:  polyethylene glycol  17 g Oral Daily   sodium chloride flush  5 mL Intracatheter Q8H   umeclidinium bromide  1 puff Inhalation Daily   Continuous Infusions:  ceFEPime (MAXIPIME) IV 2 g (07/09/18 0805)   sodium chloride     vancomycin 1,000 mg (07/08/18 2207)     LOS: 2 days    Time spent: 25 Minutes    Dana Allan, MD  [facility] Pager #: [phone] 7PM-7AM contact night coverage as above

## 2018-07-09 NOTE — Progress Notes (Addendum)
      [address]       ,[place] [address]             [phone]      Subjective:  Patient continues to have pain.  She feels like she is breathing a little better.  Objective: Vital signs in last 24 hours: Temp:  [95.4 F (35.2 C)-98.4 F (36.9 C)] 98.4 F (36.9 C) (06/24 2316) Pulse Rate:  [54-102] 81 (06/25 9147) Cardiac Rhythm: Normal sinus rhythm;Bundle branch block (06/25 0700) Resp:  [14-28] 15 (06/25 0608) BP: (82-125)/(30-71) 97/63 (06/25 8295) SpO2:  [91 %-98 %] 95 % (06/25 6213)  Intake/Output from previous day: 06/24 0701 - 06/25 0700 In: 303.1 [P.O.:20; IV Piggyback:283.1] Out: 1800 [Chest Tube:1800]  General appearance: alert, cooperative and no distress Heart: regular rate and rhythm Lungs: diminished breath sounds left base Abdomen: soft, non-tender; bowel sounds normal; no masses,  no organomegaly Wound: clean and dry, no air leak  Lab Results: Recent Labs    07/07/18 0010 07/08/18 0533  WBC 17.9* 10.4  HGB 13.9 12.7  HCT 43.1 39.1  PLT 498* 437*   BMET:  Recent Labs    07/07/18 0010 07/08/18 0533  NA 137 138  K 4.3 3.7  CL 100 103  CO2 25 25  GLUCOSE 103* 92  BUN 10 9  CREATININE 0.72 0.64  CALCIUM 9.1 8.3*    PT/INR:  Recent Labs    07/07/18 0010  LABPROT 13.1  INR 1.0   ABG No results found for: PHART, HCO3, TCO2, ACIDBASEDEF, O2SAT CBG (last 3)  Recent Labs    07/07/18 0722 07/08/18 0745  GLUCAP 86 85   Assessment/Plan:  1. Left Pleural Effusion- S/P Thoracentesis, S/P Chest tube placement, evidently dumped another 2L, 2. Cytology remains pending 3. Dispo- spoke with Dr. Prescott Gum, will unclamp chest tube, CT of chest ordered today, if fluid doesn't resolve and CT scan doesn't identify source, may require VATS to adequately drain fluid, care per primary   LOS: 2 days    Ellwood Handler 07/09/2018  Although the pigtail catheter has drained significant amount of pleural fluid, repeat CT scan of  the chest shows a significant loculated basilar collection of pleural fluid.  The left lung has expanded significantly and there is no evidence of mass, adenopathy, or pleural nodular disease.  Cytology of pleural fluid still remains pending-probably inflammatory.  If the current pigtail catheter is not able to drain most of the loculated fluid then the patient will be scheduled for left VATS on Monday.  I discussed this plan with the patient and she understands.  She is having some pain from just the pigtail catheter so hopefully VATS can be avoided.  We will follow over the next 3 days before making decision on VATS. Keep the pleural catheter to suction.  patient examined and medical record reviewed,agree with above note. Tharon Aquas Trigt III 07/09/2018

## 2018-07-10 ENCOUNTER — Inpatient Hospital Stay (HOSPITAL_COMMUNITY): Payer: Medicaid Other

## 2018-07-10 LAB — CBC WITH DIFFERENTIAL/PLATELET
Abs Immature Granulocytes: 0.14 10*3/uL — ABNORMAL HIGH (ref 0.00–0.07)
Basophils Absolute: 0 10*3/uL (ref 0.0–0.1)
Basophils Relative: 0 %
Eosinophils Absolute: 0.4 10*3/uL (ref 0.0–0.5)
Eosinophils Relative: 3 %
HCT: 40.5 % (ref 36.0–46.0)
Hemoglobin: 13.3 g/dL (ref 12.0–15.0)
Immature Granulocytes: 1 %
Lymphocytes Relative: 14 %
Lymphs Abs: 1.6 10*3/uL (ref 0.7–4.0)
MCH: 32.3 pg (ref 26.0–34.0)
MCHC: 32.8 g/dL (ref 30.0–36.0)
MCV: 98.3 fL (ref 80.0–100.0)
Monocytes Absolute: 1.3 10*3/uL — ABNORMAL HIGH (ref 0.1–1.0)
Monocytes Relative: 11 %
Neutro Abs: 7.8 10*3/uL — ABNORMAL HIGH (ref 1.7–7.7)
Neutrophils Relative %: 71 %
Platelets: 364 10*3/uL (ref 150–400)
RBC: 4.12 MIL/uL (ref 3.87–5.11)
RDW: 12 % (ref 11.5–15.5)
WBC: 11.3 10*3/uL — ABNORMAL HIGH (ref 4.0–10.5)
nRBC: 0 % (ref 0.0–0.2)

## 2018-07-10 LAB — BASIC METABOLIC PANEL
Anion gap: 9 (ref 5–15)
BUN: 5 mg/dL — ABNORMAL LOW (ref 6–20)
CO2: 26 mmol/L (ref 22–32)
Calcium: 8.5 mg/dL — ABNORMAL LOW (ref 8.9–10.3)
Chloride: 103 mmol/L (ref 98–111)
Creatinine, Ser: 0.55 mg/dL (ref 0.44–1.00)
GFR calc Af Amer: >60 mL/min (ref >60)
GFR calc non Af Amer: >60 mL/min (ref >60)
Glucose, Bld: 102 mg/dL — ABNORMAL HIGH (ref 70–99)
Potassium: 3.9 mmol/L (ref 3.5–5.1)
Sodium: 138 mmol/L (ref 135–145)

## 2018-07-10 LAB — CULTURE, RESPIRATORY W GRAM STAIN: Culture: NORMAL

## 2018-07-10 LAB — CYCLIC CITRUL PEPTIDE ANTIBODY, IGG/IGA: CCP Antibodies IgG/IgA: 4 units (ref 0–19)

## 2018-07-10 LAB — GLUCOSE, CAPILLARY: Glucose-Capillary: 136 mg/dL — ABNORMAL HIGH (ref 70–99)

## 2018-07-10 LAB — MAGNESIUM: Magnesium: 1.9 mg/dL (ref 1.7–2.4)

## 2018-07-10 LAB — PHOSPHORUS: Phosphorus: 3.5 mg/dL (ref 2.5–4.6)

## 2018-07-10 MED ORDER — CEFDINIR 300 MG PO CAPS
300.0000 mg | ORAL_CAPSULE | Freq: Two times a day (BID) | ORAL | Status: DC
  Administered 2018-07-10 – 2018-07-13 (×7): 300 mg via ORAL
  Filled 2018-07-10 (×8): qty 1

## 2018-07-10 MED ORDER — SENNA 8.6 MG PO TABS
1.0000 | ORAL_TABLET | Freq: Every day | ORAL | Status: DC
  Administered 2018-07-10 – 2018-07-12 (×2): 8.6 mg via ORAL
  Filled 2018-07-10 (×3): qty 1

## 2018-07-10 MED ORDER — IOHEXOL 300 MG/ML  SOLN
100.0000 mL | Freq: Once | INTRAMUSCULAR | Status: AC | PRN
  Administered 2018-07-10: 100 mL via INTRAVENOUS

## 2018-07-10 NOTE — Progress Notes (Signed)
Patient Transferred to 2C16 report called to RN.  Jorge Amparo, Tivis Ringer, RN

## 2018-07-10 NOTE — Progress Notes (Addendum)
      [address]       [place] [address]             [phone]       Procedure(s) (LRB): VIDEO ASSISTED THORACOSCOPY (Left) DRAINAGE OF LOCULATED PLEURAL EFFUSION (Left)  Subjective:  Up in chair this morning.  States she is going to try and walk some today.  Breathing is a little better.  Objective: Vital signs in last 24 hours: Temp:  [98 F (36.7 C)-98.5 F (36.9 C)] 98.4 F (36.9 C) (06/26 0530) Pulse Rate:  [79-95] 86 (06/25 2310) Cardiac Rhythm: Normal sinus rhythm (06/25 1900) Resp:  [16-23] 16 (06/26 0530) BP: (91-99)/(40-62) 91/52 (06/26 0530) SpO2:  [92 %-96 %] 94 % (06/26 0743)  Intake/Output from previous day: 06/25 0701 - 06/26 0700 In: 850 [P.O.:840; I.V.:10] Out: 1295 [Urine:1250; Chest Tube:45]  General appearance: alert, cooperative and no distress Heart: regular rate and rhythm Lungs: diminished breath sounds on left Abdomen: soft, non-tender; bowel sounds normal; no masses,  no organomegaly Extremities: extremities normal, atraumatic, no cyanosis or edema Wound: clean and dry  Lab Results: Recent Labs    07/08/18 0533 07/10/18 0445  WBC 10.4 11.3*  HGB 12.7 13.3  HCT 39.1 40.5  PLT 437* 364   BMET:  Recent Labs    07/08/18 0533 07/10/18 0445  NA 138 138  K 3.7 3.9  CL 103 103  CO2 25 26  GLUCOSE 92 102*  BUN 9 5*  CREATININE 0.64 0.55  CALCIUM 8.3* 8.5*    PT/INR: No results for input(s): LABPROT, INR in the last 72 hours. ABG No results found for: PHART, HCO3, TCO2, ACIDBASEDEF, O2SAT CBG (last 3)  Recent Labs    07/08/18 0745 07/09/18 0758 07/10/18 0726  GLUCAP 85 98 136*    Assessment/Plan: S/P Procedure(s) (LRB): VIDEO ASSISTED THORACOSCOPY (Left) DRAINAGE OF LOCULATED PLEURAL EFFUSION (Left)  1. Left Pleural Effusion- pigtail catheter in place, 150 cc output yesterday, no air leak... cytology did not show malignant cells 2. 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 CT of abdomen-pelvis ordered. With large malignant effusion patient would probably benefit from VATS, talc insufflation  pleurodesis and Pleurx catheter placement which will be planned for Monday.  Results of cytopathology and surgical plan discussed with patient. Will probably need GYN evaluation next week after further tissue  is obtained at surgery.

## 2018-07-10 NOTE — Progress Notes (Signed)
PROGRESS NOTE    VERA FURNISS  CWC:[id number] DOB: 04-Aug-1970 DOA: 07/06/2018 PCP: [name], MD  Outpatient Specialists:   Brief Narrative:  Patient is a 48 year old Caucasian female with past medical history significant for depression, anxiety, insomnia, IBS, migraine headache and tobacco abuse.  Patient was admitted with shortness of breath.  Work-up revealed massive left-sided pleural effusion with compressive atelectasis and shift of the mediastinum to the right side.  Patient underwent thoracentesis by the interventional radiology team, and has had pigtail catheter placed by the cardiothoracic surgery team.  Pleural fluid analysis is exudative.  No malignant cells noted.  No constitutional symptoms of infection.  Patient was initially put on antibiotics, but now being de-escalated.  Input from cardiothoracic surgery team and pulmonary team is highly appreciated.  CT of the chest will be repeated tomorrow, and if pleural effusion persists, cardiothoracic team is considering VATS.  As of breath has improved significantly with drainage of the effusion.  Assessment & Plan:   Principal Problem:   Pleural effusion on left Active Problems:   Generalized anxiety disorder   Migraine   Acute respiratory failure with hypoxia (HCC)   Tobacco abuse   Pleural effusion  Acute respiratory failure with hypoxia due to large pleural effusion on left:  CT showed "very large, simple appearing left pleural effusion with mediastinal shift to the right and inversion of the left hemidiaphragm.The left lung remains completely collapsed."  Etiology is not clear.  Differential diagnosis include malignancy, CHF and pleurisy. Patient denies chest pain which is not consistent with pleurisy. No fever, but has leukocytosis with WBC 15.6.  Will start broad antibiotics. CT surgeon, Dr. Prescott Gum was consulted by EDP for possible thoracentesis. -pleural fluid test analysis  result is noted.   -CT chest tomorrow  morning. -Possible VATS on Monday if pleural effusion persists. -Patient has pigtail catheter in situ.  Generalized anxiety disorder: -continue home prn valium   Migraine: -prn Imitrex  Tobacco abuse:  Counseled.    DVT ppx: SCD Code Status: Full code Family Communication: .     Disposition Plan:   Home Consults called: CT surgeon, Dr. Prescott Gum and pulmonary team.   Procedures:   Left-sided thoracentesis.  Pigtail catheter placement  Antimicrobials:   IV vancomycin discontinued 07/10/2018  IV aztreonam has been discontinued.  IV cefepime discontinued 07/10/2018.   Omnicef started 07/10/2018   Subjective: Shortness of breath has improved significantly. No fever or chills.  Objective: Vitals:   07/10/18 0530 07/10/18 0743 07/10/18 0804 07/10/18 1219  BP: (!) 91/52  (!) 102/51 (!) 104/45  Pulse:   (!) 103 96  Resp: 16  19 16   Temp: 98.4 F (36.9 C)  98.3 F (36.8 C) 98.4 F (36.9 C)  TempSrc:   Oral   SpO2: 92% 94% 96% 97%  Weight:      Height:        Intake/Output Summary (Last 24 hours) at 07/10/2018 1315 Last data filed at 07/10/2018 0525 Gross per 24 hour  Intake 250 ml  Output 495 ml  Net -245 ml   Filed Weights   07/06/18 2219 07/06/18 2343  Weight: 74.8 kg 76.6 kg    Examination:  General exam: Appears calm and comfortable  Respiratory system: Decreased air entry left lung field. Cardiovascular system: S1 & S2. No pedal edema. Gastrointestinal system: Abdomen is nondistended, soft and nontender. No organomegaly or masses felt. Normal bowel sounds heard. Central nervous system: Alert and oriented. No focal neurological deficits. Extremities:  No leg edema.  Data Reviewed: I have personally reviewed following labs and imaging studies  CBC: Recent Labs  Lab 07/06/18 1743 07/07/18 0010 07/08/18 0533 07/10/18 0445  WBC 15.6* 17.9* 10.4 11.3*  NEUTROABS  --   --  7.5 7.8*  HGB 13.5 13.9 12.7 13.3  HCT 42.0 43.1 39.1 40.5  MCV  101.2* 100.7* 99.7 98.3  PLT 524* 498* 437* 638   Basic Metabolic Panel: Recent Labs  Lab 07/06/18 1743 07/07/18 0010 07/08/18 0533 07/10/18 0445  NA 136 137 138 138  K 4.4 4.3 3.7 3.9  CL 102 100 103 103  CO2 22 25 25 26   GLUCOSE 142* 103* 92 102*  BUN 11 10 9  5*  CREATININE 0.75 0.72 0.64 0.55  CALCIUM 9.0 9.1 8.3* 8.5*  MG  --   --  1.9 1.9  PHOS  --   --  3.6 3.5   GFR: Estimated Creatinine Clearance: 89.9 mL/min (by C-G formula based on SCr of 0.55 mg/dL). Liver Function Tests: Recent Labs  Lab 07/07/18 0010 07/08/18 0533  AST 47*  --   ALT 81*  --   ALKPHOS 140*  --   BILITOT 0.6  --   PROT 7.0   6.9  --   ALBUMIN 3.3*   3.3* 2.3*   No results for input(s): LIPASE, AMYLASE in the last 168 hours. No results for input(s): AMMONIA in the last 168 hours. Coagulation Profile: Recent Labs  Lab 07/07/18 0010  INR 1.0   Cardiac Enzymes: Recent Labs  Lab 07/06/18 1743  TROPONINI <0.03   BNP (last 3 results) No results for input(s): PROBNP in the last 8760 hours. HbA1C: No results for input(s): HGBA1C in the last 72 hours. CBG: Recent Labs  Lab 07/07/18 0722 07/08/18 0745 07/09/18 0758 07/10/18 0726  GLUCAP 86 85 98 136*   Lipid Profile: No results for input(s): CHOL, HDL, LDLCALC, TRIG, CHOLHDL, LDLDIRECT in the last 72 hours. Thyroid Function Tests: Recent Labs    07/08/18 1830  TSH 1.492   Anemia Panel: No results for input(s): VITAMINB12, FOLATE, FERRITIN, TIBC, IRON, RETICCTPCT in the last 72 hours. Urine analysis: No results found for: COLORURINE, APPEARANCEUR, LABSPEC, PHURINE, GLUCOSEU, HGBUR, BILIRUBINUR, KETONESUR, PROTEINUR, UROBILINOGEN, NITRITE, LEUKOCYTESUR Sepsis Labs: @LABRCNTIP (procalcitonin:4,lacticidven:4)  ) Recent Results (from the past 240 hour(s))  SARS Coronavirus 2 (CEPHEID- Performed in [place] hospital lab), Hosp Order     Status: None   Collection Time: 07/06/18  8:37 PM   Specimen: Nasopharyngeal Swab    Result Value Ref Range Status   SARS Coronavirus 2 NEGATIVE NEGATIVE Final    Comment: (NOTE) If result is NEGATIVE SARS-CoV-2 target nucleic acids are NOT DETECTED. The SARS-CoV-2 RNA is generally detectable in upper and lower  respiratory specimens during the acute phase of infection. The lowest  concentration of SARS-CoV-2 viral copies this assay can detect is 250  copies / mL. A negative result does not preclude SARS-CoV-2 infection  and should not be used as the sole basis for treatment or other  patient management decisions.  A negative result may occur with  improper specimen collection / handling, submission of specimen other  than nasopharyngeal swab, presence of viral mutation(s) within the  areas targeted by this assay, and inadequate number of viral copies  (<250 copies / mL). A negative result must be combined with clinical  observations, patient history, and epidemiological information. If result is POSITIVE SARS-CoV-2 target nucleic acids are DETECTED. The SARS-CoV-2 RNA is generally detectable in  upper and lower  respiratory specimens dur ing the acute phase of infection.  Positive  results are indicative of active infection with SARS-CoV-2.  Clinical  correlation with patient history and other diagnostic information is  necessary to determine patient infection status.  Positive results do  not rule out bacterial infection or co-infection with other viruses. If result is PRESUMPTIVE POSTIVE SARS-CoV-2 nucleic acids MAY BE PRESENT.   A presumptive positive result was obtained on the submitted specimen  and confirmed on repeat testing.  While 2019 novel coronavirus  (SARS-CoV-2) nucleic acids may be present in the submitted sample  additional confirmatory testing may be necessary for epidemiological  and / or clinical management purposes  to differentiate between  SARS-CoV-2 and other Sarbecovirus currently known to infect humans.  If clinically indicated additional  testing with an alternate test  methodology [phone]) is advised. The SARS-CoV-2 RNA is generally  detectable in upper and lower respiratory sp ecimens during the acute  phase of infection. The expected result is Negative. Fact Sheet for Patients:  StrictlyIdeas.no Fact Sheet for Healthcare Providers: BankingDealers.co.za This test is not yet approved or cleared by the [place] FDA and has been authorized for detection and/or diagnosis of SARS-CoV-2 by FDA under an Emergency Use Authorization (EUA).  This EUA will remain in effect (meaning this test can be used) for the duration of the COVID-19 declaration under Section 564(b)(1) of the Act, 21 U.S.C. section 360bbb-3(b)(1), unless the authorization is terminated or revoked sooner. Performed at [facility], [place] [address], [place], [place] [address]   Culture, blood (Routine X 2) w Reflex to ID Panel     Status: None (Preliminary result)   Collection Time: 07/07/18 12:05 AM   Specimen: BLOOD  Result Value Ref Range Status   Specimen Description BLOOD RIGHT ARM  Final   Special Requests   Final    BOTTLES DRAWN AEROBIC ONLY Blood Culture adequate volume   Culture   Final    NO GROWTH 3 DAYS Performed at [facility], [address] [place], [place] [address]    Report Status PENDING  Incomplete  Culture, blood (Routine X 2) w Reflex to ID Panel     Status: None (Preliminary result)   Collection Time: 07/07/18 12:10 AM   Specimen: BLOOD  Result Value Ref Range Status   Specimen Description BLOOD RIGHT ARM  Final   Special Requests   Final    BOTTLES DRAWN AEROBIC ONLY Blood Culture adequate volume   Culture   Final    NO GROWTH 3 DAYS Performed at [facility], [place] [address], [place], [place] [address]    Report Status PENDING  Incomplete  MRSA PCR Screening     Status: None   Collection Time: 07/07/18  1:42 AM   Specimen: Nasal Mucosa; Nasopharyngeal    Result Value Ref Range Status   MRSA by PCR NEGATIVE NEGATIVE Final    Comment:        The GeneXpert MRSA Assay (FDA approved for NASAL specimens only), is one component of a comprehensive MRSA colonization surveillance program. It is not intended to diagnose MRSA infection nor to guide or monitor treatment for MRSA infections. Performed at [facility], [place] [address], [place], [place] [address]   Fungus Culture With Stain     Status: None (Preliminary result)   Collection Time: 07/07/18 10:10 AM   Specimen: Pleural Fluid  Result Value Ref Range Status   Fungus  Stain Final report  Final    Comment: (NOTE) Performed At: [facility] [place], [place] [id number] Rush Farmer MD OZ:[id number]    Fungus (Mycology) Culture PENDING  Incomplete   Fungal Source PLEURAL  Final    Comment: LEFT Performed at [facility], [place] [address], [place], [place] [address]   Culture, body fluid-bottle     Status: None (Preliminary result)   Collection Time: 07/07/18 10:10 AM   Specimen: Pleura  Result Value Ref Range Status   Specimen Description PLEURAL LEFT  Final   Special Requests NONE  Final   Culture   Final    NO GROWTH 3 DAYS Performed at [place] [address] [place], [place] [address]    Report Status PENDING  Incomplete  Gram stain     Status: None   Collection Time: 07/07/18 10:10 AM   Specimen: Pleura  Result Value Ref Range Status   Specimen Description PLEURAL LEFT  Final   Special Requests NONE  Final   Gram Stain   Final    ABUNDANT WBC PRESENT,BOTH PMN AND MONONUCLEAR NO ORGANISMS SEEN Performed at [facility], [address] [place], [place] [address]    Report Status 07/07/2018 FINAL  Final  Acid Fast Smear (AFB)     Status: None   Collection Time: 07/07/18 10:10 AM   Specimen: Pleural Fluid  Result Value Ref Range Status   AFB Specimen Processing Comment  Final    Comment: Tissue Grinding and  Digestion/Decontamination   Acid Fast Smear Negative  Final    Comment: (NOTE) Performed At: [facility] [address] [place], [place] [id number] Rush Farmer MD YT:[id number]    Source (AFB) PLEURAL  Final    Comment: Performed at [facility], [place] [address] [place], [place] [address]  Fungus Culture Result     Status: None   Collection Time: 07/07/18 10:10 AM  Result Value Ref Range Status   Result 1 Comment  Final    Comment: (NOTE) KOH/Calcofluor preparation:  no fungus observed. Performed At: [facility] [place], [place] [id number] Rush Farmer MD CB:[id number]   Expectorated sputum assessment w rflx to resp cult     Status: None   Collection Time: 07/07/18  5:55 PM   Specimen: Expectorated Sputum  Result Value Ref Range Status   Specimen Description EXPECTORATED SPUTUM  Final   Special Requests NONE  Final   Sputum evaluation   Final    THIS SPECIMEN IS ACCEPTABLE FOR SPUTUM CULTURE Performed at [facility], [address], [place], [place] [address]    Report Status 07/07/2018 FINAL  Final  Culture, respiratory     Status: None   Collection Time: 07/07/18  5:55 PM  Result Value Ref Range Status   Specimen Description EXPECTORATED SPUTUM  Final   Special Requests NONE Reflexed from [id number]  Final   Gram Stain   Final    ABUNDANT WBC PRESENT, PREDOMINANTLY PMN FEW GRAM POSITIVE COCCI    Culture   Final    Consistent with normal respiratory flora. Performed at [facility], [place] [address], [place], [place] [address]    Report Status 07/10/2018 FINAL  Final         Radiology Studies: Ct Chest W Contrast  Result Date: 07/09/2018 CLINICAL DATA:  Large, reaccumulating left pleural effusion EXAM: CT CHEST WITH CONTRAST TECHNIQUE: Multidetector CT imaging of the chest was  performed during intravenous contrast administration. CONTRAST:  69mL OMNIPAQUE IOHEXOL 300 MG/ML  SOLN COMPARISON:  Chest radiograph,  07/09/2018 FINDINGS: Cardiovascular: No significant vascular findings. Normal heart size. No pericardial effusion. Mediastinum/Nodes: There are prominent although not pathologically enlarged mediastinal lymph nodes. Moderate hiatal hernia. Thyroid gland, trachea, and esophagus demonstrate no significant findings. Lungs/Pleura: There is a moderate, loculated left hydropneumothorax with a small air component and moderate fluid component. The largest loculated component is located posteriorly. There is a pigtail drainage catheter about the lateral pleural space. There is a small right pleural effusion with associated atelectasis or consolidation and a subpleural consolidation of the superior segment right lower lobe (series 4, image 56). Upper Abdomen: No acute abnormality. Musculoskeletal: No chest wall mass or suspicious bone lesions identified. IMPRESSION: 1. There is a moderate, loculated left hydropneumothorax with a small air component and moderate fluid component. The largest loculated component is located posteriorly. There is a pigtail drainage catheter about the lateral pleural space. There is no obvious etiology, such as obvious mass or pleural disease. 2. There is a small right pleural effusion with associated atelectasis or consolidation and a subpleural consolidation of the superior segment right lower lobe (series 4, image 56), of uncertain significance, possibly infectious or inflammatory. Electronically Signed   By: Eddie Candle M.D.   On: 07/09/2018 11:31   Dg Chest Port 1 View  Result Date: 07/10/2018 CLINICAL DATA:  Pleural effusion EXAM: PORTABLE CHEST 1 VIEW COMPARISON:  Chest CT and chest radiograph July 09, 2018 FINDINGS: Chest tube position is unchanged. No pneumothorax is demonstrable by radiography. Central catheter tip is in the superior vena cava. Moderate pleural effusion persists on the left with compressive atelectasis in the left lower lobe. No new opacity evident on either side.  Heart is upper normal in size, stable, with pulmonary vascularity within normal limits. No adenopathy. No bone lesions. IMPRESSION: Persistent left pleural effusion with left base compressive atelectasis. No new opacity evident. Stable cardiac silhouette. Tube and catheter positions unchanged without demonstrable pneumothorax by radiography. Electronically Signed   By: Lowella Grip III M.D.   On: 07/10/2018 09:33   Dg Chest Port 1 View  Result Date: 07/09/2018 CLINICAL DATA:  Left pleural effusion.  Chest tube in place. EXAM: PORTABLE CHEST 1 VIEW COMPARISON:  PA and lateral chest 07/08/2018. FINDINGS: New right PICC is identified with its tip projecting at the superior cavoatrial junction. The patient also has a new pigtail catheter in the left chest. Small to moderate left pleural effusion is decreased compared to the prior examination. No pneumothorax. Left basilar airspace disease persists. Small focus of discoid atelectasis right mid lung noted. Heart size is upper normal. IMPRESSION: Decreased left pleural effusion with a new chest tube in place. Negative for pneumothorax. Electronically Signed   By: Inge Rise M.D.   On: 07/09/2018 09:14   Ir Picc Placement Right >5 Yrs Inc Img Guide  Result Date: 07/08/2018 CLINICAL DATA:  Large left pleural effusion, poor IV access EXAM: PICC PLACEMENT WITH ULTRASOUND AND FLUOROSCOPY FLUOROSCOPY TIME:  18 seconds; 3 mGy TECHNIQUE: After written informed consent was obtained, patient was placed in the supine position on angiographic table. Patency of the right brachial vein was confirmed with ultrasound with image documentation. An appropriate skin site was determined. Skin site was marked. Region was prepped using maximum barrier technique including cap and mask, sterile gown, sterile gloves, large sterile sheet, and Chlorhexidine as cutaneous antisepsis. The region was infiltrated locally with 1% lidocaine. Under real-time  ultrasound guidance, the right  brachial vein was accessed with a 21 gauge micropuncture needle; the needle tip within the vein was confirmed with ultrasound image documentation. Needle exchanged over a 018 guidewire for a peel-away sheath, through which a 5-French double-lumen power injectable PICC trimmed to 38cm was advanced, positioned with its tip near the cavoatrial junction. Spot chest radiograph confirms appropriate catheter position. Catheter was flushed per protocol and secured externally. The patient tolerated procedure well. COMPLICATIONS: COMPLICATIONS none IMPRESSION: 1. Technically successful five [place] double lumen power injectable PICC placement Electronically Signed   By: Lucrezia Europe M.D.   On: 07/08/2018 16:40   Ir Perc Pleural Drain W/[place] Cath W/img Guide  Result Date: 07/08/2018 CLINICAL DATA:  Large left pleural effusion EXAM: LEFT CHEST TUBE PLACEMENT UNDER ULTRASOUND AND FLUOROSCOPIC GUIDANCE ANESTHESIA/SEDATION: Intravenous Fentanyl 124mcg and Versed 4mg  were administered as conscious sedation during continuous monitoring of the patient's level of consciousness and physiological / cardiorespiratory status by the radiology RN, with a total moderate sedation time of 49 minutes. PROCEDURE: The procedure, risks, benefits, and alternatives were explained to the patient. Questions regarding the procedure were encouraged and answered. The patient understands and consents to the procedure. appropriate skin entry site was determined with ultrasound and marked. The operative field was prepped with chlorhexidinein a sterile fashion, and a sterile drape was applied covering the operative field. A sterile gown and sterile gloves were used for the procedure. Local anesthesia was provided with 1% Lidocaine. Under ultrasound guidance, a 19 gauge percutaneous entry needle advanced into the left pleural space from a lateral approach. Blood-tinged fluid could be aspirated. Amplatz guidewire advanced towards the apex under  fluoroscopy. Tract dilated to facilitate placement of 14 French pigtail catheter, directed towards the apex. Catheter secured externally with 0 Prolene suture and placed to Pleur-evac. 500 mL returned spontaneously into the canister without suction. Site was covered with a sterile dressing the patient returned to her room in good condition. COMPLICATIONS: None immediate FINDINGS: Large left pleural effusion was noted. 14 French pigtail catheter placed as above. IMPRESSION: 1. Technically successful placement of left 14 French pigtail chest drain, placed to Pleur-evac water-seal. Electronically Signed   By: Lucrezia Europe M.D.   On: 07/08/2018 16:42        Scheduled Meds:  cefdinir  300 mg Oral Q12H   polyethylene glycol  17 g Oral Daily   senna  1 tablet Oral Daily   sodium chloride flush  5 mL Intracatheter Q8H   umeclidinium bromide  1 puff Inhalation Daily   Continuous Infusions:    LOS: 3 days    Time spent: 31 Minutes    Dana Allan, MD  [facility] Pager #: [phone] 7PM-7AM contact night coverage as above

## 2018-07-11 ENCOUNTER — Inpatient Hospital Stay (HOSPITAL_COMMUNITY): Payer: Medicaid Other

## 2018-07-11 DIAGNOSIS — I878 Other specified disorders of veins: Secondary | ICD-10-CM

## 2018-07-11 DIAGNOSIS — Z9889 Other specified postprocedural states: Secondary | ICD-10-CM

## 2018-07-11 DIAGNOSIS — Z9689 Presence of other specified functional implants: Secondary | ICD-10-CM

## 2018-07-11 LAB — GLUCOSE, CAPILLARY: Glucose-Capillary: 150 mg/dL — ABNORMAL HIGH (ref 70–99)

## 2018-07-11 MED ORDER — ALUM & MAG HYDROXIDE-SIMETH 200-200-20 MG/5ML PO SUSP
30.0000 mL | ORAL | Status: DC | PRN
  Administered 2018-07-11 – 2018-07-12 (×4): 30 mL via ORAL
  Filled 2018-07-11 (×4): qty 30

## 2018-07-11 MED ORDER — POLYETHYLENE GLYCOL 3350 17 G PO PACK
34.0000 g | PACK | Freq: Every day | ORAL | Status: DC
  Administered 2018-07-11: 34 g via ORAL
  Filled 2018-07-11: qty 2

## 2018-07-11 NOTE — Progress Notes (Signed)
PROGRESS NOTE  Michelle Holt YHC:[id number] DOB: 1970-12-23 DOA: 07/06/2018 PCP: [name], MD   LOS: 4 days   Brief Narrative / Interim history: 48 year old female with history of depression, anxiety, IBS, headaches, tobacco use was admitted to the hospital with shortness of breath.  Work-up revealed left-sided pleural effusion with compressive atelectasis and mediastinal shift.  She underwent thoracentesis by IR and pigtail catheter placement.  Cardiothoracic surgery following.  Subjective: Complains of soreness at the site of the chest tube but otherwise she is feeling well.  Still has mild shortness of breath.  Denies any chest pain, no abdominal pain, no nausea or vomiting  Assessment & Plan: Principal Problem:   Pleural effusion on left Active Problems:   Generalized anxiety disorder   Migraine   Acute respiratory failure with hypoxia (HCC)   Tobacco abuse   Pleural effusion   Principal Problem Acute hypoxic respiratory failure due to large left pleural effusion, concern for malignant effusion -CT chest on 6/25 showed a loculated left hydropneumothorax -Initial pathology of the fluid analysis was mentioned to be without evidence of malignancy however cardiothoracic note dated 6/26 mentioned that pathology called because there appears to be malignant cells, adenocarcinoma -Currently has a chest tube, further management per cardiothoracic surgery.  It seems like she may undergo VATS on Monday -CT abdomen pelvis on 6/26 with ovarian findings concern for GYN primary malignancy -She was initially on broad-spectrum antibiotics but now narrowed to cefdinir for 5 days  Active Problems Generalized anxiety disorder -Continue home Valium  Migraine headaches -PRN Imitrex  Tobacco abuse -We will need to quit   Scheduled Meds:  cefdinir  300 mg Oral Q12H   polyethylene glycol  34 g Oral Daily   senna  1 tablet Oral Daily   sodium chloride flush  5 mL Intracatheter Q8H     umeclidinium bromide  1 puff Inhalation Daily   Continuous Infusions: PRN Meds:.acetaminophen **OR** acetaminophen, dextromethorphan-guaiFENesin, diazepam, diphenhydrAMINE, nitroGLYCERIN, ondansetron **OR** ondansetron (ZOFRAN) IV, oxyCODONE-acetaminophen, SUMAtriptan  DVT prophylaxis: SCDs Code Status: Full code Family Communication: d/w patient Disposition Plan: home when ready   Consultants:   Thoracic surgery   Procedures:   Pigtail catheter placement   Antimicrobials:  Cefdinir   Objective: Vitals:   07/10/18 2321 07/11/18 0418 07/11/18 0720 07/11/18 0754  BP: (!) 85/48 (!) 97/45 (!) 97/48   Pulse: 88 84  100  Resp: 18 14  (!) 22  Temp: 98.7 F (37.1 C) 98.4 F (36.9 C) 98 F (36.7 C)   TempSrc: Oral Oral Oral   SpO2: 95% 92%  94%  Weight:      Height:        Intake/Output Summary (Last 24 hours) at 07/11/2018 1000 Last data filed at 07/11/2018 0720 Gross per 24 hour  Intake 480 ml  Output 300 ml  Net 180 ml   Filed Weights   07/06/18 2219 07/06/18 2343 07/10/18 2135  Weight: 74.8 kg 76.6 kg 74.8 kg    Examination:  Constitutional: NAD Eyes: PERRL, lids and conjunctivae normal ENMT: Mucous membranes are moist. No oropharyngeal exudates Neck: normal, supple, no masses, no thyromegaly Respiratory: Diminished breath sounds at the left lung base, no wheezing or crackles, shallow respirations Cardiovascular: Regular rate and rhythm, no murmurs / rubs / gallops. No LE edema.  Abdomen: no tenderness. Bowel sounds positive.  Musculoskeletal: no clubbing / cyanosis. No joint deformity upper and lower extremities. No contractures. Normal muscle tone.  Skin: no rashes, lesions, ulcers. No induration Neurologic:  CN 2-12 grossly intact. Strength 5/5 in all 4.  Psychiatric: Normal judgment and insight. Alert and oriented x 3. Normal mood.    Data Reviewed: I have independently reviewed following labs and imaging studies   CBC: Recent Labs  Lab  2018/07/28 1743 07/07/18 0010 07/08/18 0533 07/10/18 0445  WBC 15.6* 17.9* 10.4 11.3*  NEUTROABS  --   --  7.5 7.8*  HGB 13.5 13.9 12.7 13.3  HCT 42.0 43.1 39.1 40.5  MCV 101.2* 100.7* 99.7 98.3  PLT 524* 498* 437* 378   Basic Metabolic Panel: Recent Labs  Lab July 28, 2018 1743 07/07/18 0010 07/08/18 0533 07/10/18 0445  NA 136 137 138 138  K 4.4 4.3 3.7 3.9  CL 102 100 103 103  CO2 22 25 25 26   GLUCOSE 142* 103* 92 102*  BUN 11 10 9  5*  CREATININE 0.75 0.72 0.64 0.55  CALCIUM 9.0 9.1 8.3* 8.5*  MG  --   --  1.9 1.9  PHOS  --   --  3.6 3.5   GFR: Estimated Creatinine Clearance: 88.9 mL/min (by C-G formula based on SCr of 0.55 mg/dL). Liver Function Tests: Recent Labs  Lab 07/07/18 0010 07/08/18 0533  AST 47*  --   ALT 81*  --   ALKPHOS 140*  --   BILITOT 0.6  --   PROT 7.0   6.9  --   ALBUMIN 3.3*   3.3* 2.3*   No results for input(s): LIPASE, AMYLASE in the last 168 hours. No results for input(s): AMMONIA in the last 168 hours. Coagulation Profile: Recent Labs  Lab 07/07/18 0010  INR 1.0   Cardiac Enzymes: Recent Labs  Lab 07/28/18 1743  TROPONINI <0.03   BNP (last 3 results) No results for input(s): PROBNP in the last 8760 hours. HbA1C: No results for input(s): HGBA1C in the last 72 hours. CBG: Recent Labs  Lab 07/07/18 0722 07/08/18 0745 07/09/18 0758 07/10/18 0726 07/11/18 0800  GLUCAP 86 85 98 136* 150*   Lipid Profile: No results for input(s): CHOL, HDL, LDLCALC, TRIG, CHOLHDL, LDLDIRECT in the last 72 hours. Thyroid Function Tests: Recent Labs    07/08/18 1830  TSH 1.492   Anemia Panel: No results for input(s): VITAMINB12, FOLATE, FERRITIN, TIBC, IRON, RETICCTPCT in the last 72 hours. Urine analysis: No results found for: COLORURINE, APPEARANCEUR, LABSPEC, PHURINE, GLUCOSEU, HGBUR, BILIRUBINUR, KETONESUR, PROTEINUR, UROBILINOGEN, NITRITE, LEUKOCYTESUR Sepsis Labs: Invalid input(s): PROCALCITONIN, LACTICIDVEN  Recent Results (from  the past 240 hour(s))  SARS Coronavirus 2 (CEPHEID- Performed in [place] hospital lab), Hosp Order     Status: None   Collection Time: 07-28-2018  8:37 PM   Specimen: Nasopharyngeal Swab  Result Value Ref Range Status   SARS Coronavirus 2 NEGATIVE NEGATIVE Final    Comment: (NOTE) If result is NEGATIVE SARS-CoV-2 target nucleic acids are NOT DETECTED. The SARS-CoV-2 RNA is generally detectable in upper and lower  respiratory specimens during the acute phase of infection. The lowest  concentration of SARS-CoV-2 viral copies this assay can detect is 250  copies / mL. A negative result does not preclude SARS-CoV-2 infection  and should not be used as the sole basis for treatment or other  patient management decisions.  A negative result may occur with  improper specimen collection / handling, submission of specimen other  than nasopharyngeal swab, presence of viral mutation(s) within the  areas targeted by this assay, and inadequate number of viral copies  (<250 copies / mL). A negative result must be combined  with clinical  observations, patient history, and epidemiological information. If result is POSITIVE SARS-CoV-2 target nucleic acids are DETECTED. The SARS-CoV-2 RNA is generally detectable in upper and lower  respiratory specimens dur ing the acute phase of infection.  Positive  results are indicative of active infection with SARS-CoV-2.  Clinical  correlation with patient history and other diagnostic information is  necessary to determine patient infection status.  Positive results do  not rule out bacterial infection or co-infection with other viruses. If result is PRESUMPTIVE POSTIVE SARS-CoV-2 nucleic acids MAY BE PRESENT.   A presumptive positive result was obtained on the submitted specimen  and confirmed on repeat testing.  While 2019 novel coronavirus  (SARS-CoV-2) nucleic acids may be present in the submitted sample  additional confirmatory testing may be necessary  for epidemiological  and / or clinical management purposes  to differentiate between  SARS-CoV-2 and other Sarbecovirus currently known to infect humans.  If clinically indicated additional testing with an alternate test  methodology [phone]) is advised. The SARS-CoV-2 RNA is generally  detectable in upper and lower respiratory sp ecimens during the acute  phase of infection. The expected result is Negative. Fact Sheet for Patients:  StrictlyIdeas.no Fact Sheet for Healthcare Providers: BankingDealers.co.za This test is not yet approved or cleared by the [place] FDA and has been authorized for detection and/or diagnosis of SARS-CoV-2 by FDA under an Emergency Use Authorization (EUA).  This EUA will remain in effect (meaning this test can be used) for the duration of the COVID-19 declaration under Section 564(b)(1) of the Act, 21 U.S.C. section 360bbb-3(b)(1), unless the authorization is terminated or revoked sooner. Performed at [facility], [place] [address] [place], [place] [address]   Culture, blood (Routine X 2) w Reflex to ID Panel     Status: None (Preliminary result)   Collection Time: 07/07/18 12:05 AM   Specimen: BLOOD  Result Value Ref Range Status   Specimen Description BLOOD RIGHT ARM  Final   Special Requests   Final    BOTTLES DRAWN AEROBIC ONLY Blood Culture adequate volume   Culture   Final    NO GROWTH 4 DAYS Performed at [facility], [place] [address] [place], [place] [address]    Report Status PENDING  Incomplete  Culture, blood (Routine X 2) w Reflex to ID Panel     Status: None (Preliminary result)   Collection Time: 07/07/18 12:10 AM   Specimen: BLOOD  Result Value Ref Range Status   Specimen Description BLOOD RIGHT ARM  Final   Special Requests   Final    BOTTLES DRAWN AEROBIC ONLY Blood Culture adequate volume   Culture   Final    NO GROWTH 4 DAYS Performed at [facility],  [place] [address] [place], [place] [address]    Report Status PENDING  Incomplete  MRSA PCR Screening     Status: None   Collection Time: 07/07/18  1:42 AM   Specimen: Nasal Mucosa; Nasopharyngeal  Result Value Ref Range Status   MRSA by PCR NEGATIVE NEGATIVE Final    Comment:        The GeneXpert MRSA Assay (FDA approved for NASAL specimens only), is one component of a comprehensive MRSA colonization surveillance program. It is not intended to diagnose MRSA infection nor to guide or monitor treatment for MRSA infections. Performed at [facility], [place] [address], [place], [place] [address]   Fungus Culture With Stain  Status: None (Preliminary result)   Collection Time: 07/07/18 10:10 AM   Specimen: Pleural Fluid  Result Value Ref Range Status   Fungus Stain Final report  Final    Comment: (NOTE) Performed At: [facility] [place], [place] [id number] Rush [place] MD NO:[id number]    Fungus (Mycology) Culture PENDING  Incomplete   Fungal Source PLEURAL  Final    Comment: LEFT Performed at [facility], [place] [address] [place], [place] [address]   Culture, body fluid-bottle     Status: None (Preliminary result)   Collection Time: 07/07/18 10:10 AM   Specimen: Pleura  Result Value Ref Range Status   Specimen Description PLEURAL LEFT  Final   Special Requests NONE  Final   Culture   Final    NO GROWTH 4 DAYS Performed at [place] [address] [place], [place] [address]    Report Status PENDING  Incomplete  Gram stain     Status: None   Collection Time: 07/07/18 10:10 AM   Specimen: Pleura  Result Value Ref Range Status   Specimen Description PLEURAL LEFT  Final   Special Requests NONE  Final   Gram Stain   Final    ABUNDANT WBC PRESENT,BOTH PMN AND MONONUCLEAR NO ORGANISMS SEEN Performed at [facility], [address] [place], [place] [address]    Report Status 07/07/2018 FINAL  Final  Acid Fast Smear (AFB)      Status: None   Collection Time: 07/07/18 10:10 AM   Specimen: Pleural Fluid  Result Value Ref Range Status   AFB Specimen Processing Comment  Final    Comment: Tissue Grinding and Digestion/Decontamination   Acid Fast Smear Negative  Final    Comment: (NOTE) Performed At: [facility] [address] [place], [place] [id number] Rush Farmer MD LE:[id number]    Source (AFB) PLEURAL  Final    Comment: Performed at [facility], [place] [address] [place], [place] [address]  Fungus Culture Result     Status: None   Collection Time: 07/07/18 10:10 AM  Result Value Ref Range Status   Result 1 Comment  Final    Comment: (NOTE) KOH/Calcofluor preparation:  no fungus observed. Performed At: [facility] [place], [place] [id number] Rush Farmer MD ZL:[id number]   Expectorated sputum assessment w rflx to resp cult     Status: None   Collection Time: 07/07/18  5:55 PM   Specimen: Expectorated Sputum  Result Value Ref Range Status   Specimen Description EXPECTORATED SPUTUM  Final   Special Requests NONE  Final   Sputum evaluation   Final    THIS SPECIMEN IS ACCEPTABLE FOR SPUTUM CULTURE Performed at [facility], [address], [place], [place] [address]    Report Status 07/07/2018 FINAL  Final  Culture, respiratory     Status: None   Collection Time: 07/07/18  5:55 PM  Result Value Ref Range Status   Specimen Description EXPECTORATED SPUTUM  Final   Special Requests NONE Reflexed from [id number]  Final   Gram Stain   Final    ABUNDANT WBC PRESENT, PREDOMINANTLY PMN FEW GRAM POSITIVE COCCI    Culture   Final    Consistent with normal respiratory flora. Performed at [facility], [place] [address], [place], [place] [address]    Report Status 07/10/2018 FINAL  Final      Radiology Studies: Ct Abdomen Pelvis W Contrast  Result  Date: 07/10/2018 CLINICAL DATA:  Large left pleural effusion, suspect gyn primary malignancy EXAM: CT  ABDOMEN AND PELVIS WITH CONTRAST TECHNIQUE: Multidetector CT imaging of the abdomen and pelvis was performed using the standard protocol following bolus administration of intravenous contrast. CONTRAST:  117mL OMNIPAQUE IOHEXOL 300 MG/ML  SOLN COMPARISON:  CT chest, 07/09/2018, CT abdomen pelvis, 05/10/2011 FINDINGS: Lower chest: Loculated left-sided pleural effusion with left-sided pleural drainage catheter in position. Small right pleural effusion. Hepatobiliary: No focal liver abnormality is seen. Status post cholecystectomy. No biliary dilatation. Pancreas: Unremarkable. No pancreatic ductal dilatation or surrounding inflammatory changes. Spleen: Normal in size without significant abnormality. Adrenals/Urinary Tract: Adrenal glands are unremarkable. Kidneys are normal, without renal calculi, solid lesion, or hydronephrosis. Bladder is unremarkable. Stomach/Bowel: Stomach is within normal limits. Appendix appears normal. No evidence of bowel wall thickening, distention, or inflammatory changes. Large burden of stool in the colon. Vascular/Lymphatic: No significant vascular findings are present. No enlarged abdominal or pelvic lymph nodes. Reproductive: The bilateral ovaries are enlarged by heterogeneous appearing cystic lesions, measuring 5.3 x 4.2 cm on the right (series 4, image 72) and 4.5 x 3.2 cm on the left (series 4, image 75). Other: No abdominal wall hernia or abnormality. Trace ascites. There is some suggestion of omental and peritoneal nodularity (e.g. Series 4, image 43). Musculoskeletal: No acute or significant osseous findings. IMPRESSION: 1. The bilateral ovaries are enlarged by heterogeneous appearing cystic lesions, measuring 5.3 x 4.2 cm on the right (series 4, image 72) and 4.5 x 3.2 cm on the left (series 4, image 75). Consider dedicated pelvic ultrasound and/or pelvic MRI to further evaluate for solid components given high suspicion for GYN primary malignancy. 2. No other evidence of mass and  no lymphadenopathy in the abdomen or pelvis. 3. Trace ascites. There is some suggestion of omental and peritoneal nodularity (e.g. Series 4, image 8), concerning for peritoneal metastatic disease. 4. Loculated left-sided pleural effusion with left-sided pleural drainage catheter in position. Small right pleural effusion. Electronically Signed   By: Eddie Candle M.D.   On: 07/10/2018 21:14   Dg Chest Port 1 View  Result Date: 07/11/2018 CLINICAL DATA:  47 year old female with a history of left-sided pleural effusion. EXAM: PORTABLE CHEST 1 VIEW COMPARISON:  07/10/2018, 07/09/2018, CT 07/09/2010 FINDINGS: Cardiomediastinal silhouette unchanged with the left heart border partially obscured by lung/pleural disease. Unchanged left thoracostomy tube. Contour of the medial aspect of the left upper lobe is visualized at the apex, the so-called "luftsichel sign", new from the comparison and representing partial collapse of the left upper lobe. Right lung relatively well aerated. No new confluent airspace disease Unchanged right upper extremity PICC. IMPRESSION: Unchanged left-sided thoracostomy tube, with hydropneumothorax. The fluid component is relatively unchanged at the left lung base, with new left upper lobe collapse and small gas component at the apex. Opacity at the left lung base likely a combination of pleural fluid/loculated pleural fluid and associated atelectasis/consolidation. Unchanged right upper extremity PICC. Electronically Signed   By: Corrie Mckusick D.O.   On: 07/11/2018 08:53   Dg Chest Port 1 View  Result Date: 07/10/2018 CLINICAL DATA:  Pleural effusion EXAM: PORTABLE CHEST 1 VIEW COMPARISON:  Chest CT and chest radiograph July 09, 2018 FINDINGS: Chest tube position is unchanged. No pneumothorax is demonstrable by radiography. Central catheter tip is in the superior vena cava. Moderate pleural effusion persists on the left with compressive atelectasis in the left lower lobe. No new opacity  evident on either side. Heart is upper normal  in size, stable, with pulmonary vascularity within normal limits. No adenopathy. No bone lesions. IMPRESSION: Persistent left pleural effusion with left base compressive atelectasis. No new opacity evident. Stable cardiac silhouette. Tube and catheter positions unchanged without demonstrable pneumothorax by radiography. Electronically Signed   By: Lowella Grip III M.D.   On: 07/10/2018 09:33    Marzetta Board, MD, PhD [facility]  Contact via  [email or web address]  [place] P: [phone] F: [phone]

## 2018-07-11 NOTE — Plan of Care (Signed)

## 2018-07-11 NOTE — Plan of Care (Signed)
  Problem: Education: Goal: Knowledge of General Education information will improve Description: Including pain rating scale, medication(s)/side effects and non-pharmacologic comfort measures 07/11/2018 0941 by Neil Crouch, RN Outcome: Progressing 07/11/2018 0941 by Neil Crouch, RN Outcome: Progressing   Problem: Health Behavior/Discharge Planning: Goal: Ability to manage health-related needs will improve 07/11/2018 0941 by Neil Crouch, RN Outcome: Progressing 07/11/2018 0941 by Neil Crouch, RN Outcome: Progressing   Problem: Clinical Measurements: Goal: Ability to maintain clinical measurements within normal limits will improve 07/11/2018 0941 by Neil Crouch, RN Outcome: Progressing 07/11/2018 0941 by Neil Crouch, RN Outcome: Progressing Goal: Respiratory complications will improve 07/11/2018 0941 by Neil Crouch, RN Outcome: Progressing 07/11/2018 0941 by Neil Crouch, RN Outcome: Progressing

## 2018-07-12 LAB — URINALYSIS, ROUTINE W REFLEX MICROSCOPIC
Bacteria, UA: NONE SEEN
Bilirubin Urine: NEGATIVE
Glucose, UA: NEGATIVE mg/dL
Ketones, ur: NEGATIVE mg/dL
Leukocytes,Ua: NEGATIVE
Nitrite: NEGATIVE
Protein, ur: NEGATIVE mg/dL
Specific Gravity, Urine: 1.01 (ref 1.005–1.030)
pH: 8 (ref 5.0–8.0)

## 2018-07-12 LAB — CULTURE, BLOOD (ROUTINE X 2)
Culture: NO GROWTH
Culture: NO GROWTH
Special Requests: ADEQUATE
Special Requests: ADEQUATE

## 2018-07-12 LAB — CBC
HCT: 37.2 % (ref 36.0–46.0)
HCT: 38.4 % (ref 36.0–46.0)
Hemoglobin: 12.2 g/dL (ref 12.0–15.0)
Hemoglobin: 12.8 g/dL (ref 12.0–15.0)
MCH: 32.5 pg (ref 26.0–34.0)
MCH: 32.8 pg (ref 26.0–34.0)
MCHC: 32.8 g/dL (ref 30.0–36.0)
MCHC: 33.3 g/dL (ref 30.0–36.0)
MCV: 99.2 fL (ref 80.0–100.0)
MCV: 98.5 fL (ref 80.0–100.0)
Platelets: 355 10*3/uL (ref 150–400)
Platelets: 347 10*3/uL (ref 150–400)
RBC: 3.75 MIL/uL — ABNORMAL LOW (ref 3.87–5.11)
RBC: 3.9 MIL/uL (ref 3.87–5.11)
RDW: 12.3 % (ref 11.5–15.5)
RDW: 12.3 % (ref 11.5–15.5)
WBC: 10.2 10*3/uL (ref 4.0–10.5)
WBC: 11.5 10*3/uL — ABNORMAL HIGH (ref 4.0–10.5)
nRBC: 0 % (ref 0.0–0.2)
nRBC: 0 % (ref 0.0–0.2)

## 2018-07-12 LAB — BASIC METABOLIC PANEL
Anion gap: 10 (ref 5–15)
BUN: <5 mg/dL — ABNORMAL LOW (ref 6–20)
CO2: 28 mmol/L (ref 22–32)
Calcium: 8.5 mg/dL — ABNORMAL LOW (ref 8.9–10.3)
Chloride: 100 mmol/L (ref 98–111)
Creatinine, Ser: 0.57 mg/dL (ref 0.44–1.00)
GFR calc Af Amer: >60 mL/min (ref >60)
GFR calc non Af Amer: >60 mL/min (ref >60)
Glucose, Bld: 104 mg/dL — ABNORMAL HIGH (ref 70–99)
Potassium: 4 mmol/L (ref 3.5–5.1)
Sodium: 138 mmol/L (ref 135–145)

## 2018-07-12 LAB — COMPREHENSIVE METABOLIC PANEL
ALT: 141 U/L — ABNORMAL HIGH (ref 0–44)
AST: 57 U/L — ABNORMAL HIGH (ref 15–41)
Albumin: 2.2 g/dL — ABNORMAL LOW (ref 3.5–5.0)
Alkaline Phosphatase: 322 U/L — ABNORMAL HIGH (ref 38–126)
Anion gap: 10 (ref 5–15)
BUN: <5 mg/dL — ABNORMAL LOW (ref 6–20)
CO2: 26 mmol/L (ref 22–32)
Calcium: 8.6 mg/dL — ABNORMAL LOW (ref 8.9–10.3)
Chloride: 101 mmol/L (ref 98–111)
Creatinine, Ser: 0.59 mg/dL (ref 0.44–1.00)
GFR calc Af Amer: >60 mL/min (ref >60)
GFR calc non Af Amer: >60 mL/min (ref >60)
Glucose, Bld: 147 mg/dL — ABNORMAL HIGH (ref 70–99)
Potassium: 3.7 mmol/L (ref 3.5–5.1)
Sodium: 137 mmol/L (ref 135–145)
Total Bilirubin: 0.6 mg/dL (ref 0.3–1.2)
Total Protein: 5.8 g/dL — ABNORMAL LOW (ref 6.5–8.1)

## 2018-07-12 LAB — CULTURE, BODY FLUID W GRAM STAIN -BOTTLE: Culture: NO GROWTH

## 2018-07-12 LAB — PROTIME-INR
INR: 1.1 (ref 0.8–1.2)
Prothrombin Time: 13.7 seconds (ref 11.4–15.2)

## 2018-07-12 LAB — PREPARE RBC (CROSSMATCH)

## 2018-07-12 LAB — APTT: aPTT: 28 seconds (ref 24–36)

## 2018-07-12 LAB — ABO/RH: ABO/RH(D): O POS

## 2018-07-12 MED ORDER — VANCOMYCIN HCL IN DEXTROSE 1-5 GM/200ML-% IV SOLN
1000.0000 mg | INTRAVENOUS | Status: AC
  Administered 2018-07-13: 1000 mg via INTRAVENOUS

## 2018-07-12 NOTE — Progress Notes (Signed)
PROGRESS NOTE  Michelle Holt SWH:[id number] DOB: 04-26-1970 DOA: 07/06/2018 PCP: [name], MD   LOS: 5 days   Brief Narrative / Interim history: 48 year old female with history of depression, anxiety, IBS, headaches, tobacco use was admitted to the hospital with shortness of breath.  Work-up revealed left-sided pleural effusion with compressive atelectasis and mediastinal shift.  She underwent thoracentesis by IR and pigtail catheter placement.  Cardiothoracic surgery following.  Subjective: She denies any shortness of breath this morning, denies any chest pain, no abdominal pain, nausea or vomiting.  She still has a cough which is slightly productive of yellow sputum.  Assessment & Plan: Principal Problem:   Pleural effusion on left Active Problems:   Generalized anxiety disorder   Migraine   Acute respiratory failure with hypoxia (HCC)   Tobacco abuse   Pleural effusion   Principal Problem Acute hypoxic respiratory failure due to large left pleural effusion, concern for malignant effusion -CT chest on 6/25 showed a loculated left hydropneumothorax -Initial pathology of the fluid analysis was mentioned to be without evidence of malignancy however cardiothoracic note dated 6/26 mentioned that pathology called because there appears to be malignant cells, adenocarcinoma -Continue chest tube, she appears to be on the schedule for VATS for tomorrow -CT abdomen pelvis on 6/26 with ovarian findings concern for GYN primary malignancy -She was initially on broad-spectrum antibiotics but now narrowed to cefdinir for 5 days -Discussed at length with patient regarding malignant cells in the pleural fluid as well as concern for GYN malignancy.  She is unfortunately familiar with the process as her husband just died of lung cancer.  Obtain Ca1 25 today  Active Problems Generalized anxiety disorder -Continue home Valium.  She also appears depressed today  Migraine headaches -PRN  Imitrex  Tobacco abuse -We will need to quit   Scheduled Meds:  cefdinir  300 mg Oral Q12H   polyethylene glycol  34 g Oral Daily   senna  1 tablet Oral Daily   sodium chloride flush  5 mL Intracatheter Q8H   umeclidinium bromide  1 puff Inhalation Daily   Continuous Infusions: PRN Meds:.acetaminophen **OR** acetaminophen, alum & mag hydroxide-simeth, dextromethorphan-guaiFENesin, diazepam, diphenhydrAMINE, nitroGLYCERIN, ondansetron **OR** ondansetron (ZOFRAN) IV, oxyCODONE-acetaminophen, SUMAtriptan  DVT prophylaxis: SCDs Code Status: Full code Family Communication: d/w patient Disposition Plan: home when ready   Consultants:   Thoracic surgery   Procedures:   Pigtail catheter placement   Antimicrobials:  Cefdinir   Objective: Vitals:   07/11/18 2000 07/11/18 2347 07/12/18 0600 07/12/18 0735  BP: (!) 108/51 (!) 106/39    Pulse: 97 93 76 (!) 101  Resp: (!) 22 16 17 19   Temp: 98.9 F (37.2 C) 98.6 F (37 C)    TempSrc: Oral Oral    SpO2: 91% 91% 92% 93%  Weight:      Height:        Intake/Output Summary (Last 24 hours) at 07/12/2018 0743 Last data filed at 07/12/2018 0700 Gross per 24 hour  Intake 240 ml  Output 300 ml  Net -60 ml   Filed Weights   07/06/18 2219 07/06/18 2343 07/10/18 2135  Weight: 74.8 kg 76.6 kg 74.8 kg    Examination:  Constitutional: No distress Eyes: No scleral icterus ENMT: Moist membranes Neck: normal, supple, no masses, no thyromegaly Respiratory: Diminished breath sounds at the left lung base, no wheezing, no crackles, good air movement on the right Cardiovascular: Regular rate and rhythm, no murmur.  No peripheral edema Abdomen: Soft, nontender,  nondistended, positive bowel sounds Musculoskeletal: no clubbing / cyanosis.  Skin: No rashes seen Neurologic: No focal deficits, equal strength, ambulatory Psychiatric: Normal judgment and insight. Alert and oriented x 3. Normal mood.    Data Reviewed: I have  independently reviewed following labs and imaging studies   CBC: Recent Labs  Lab 07/06/18 1743 07/07/18 0010 07/08/18 0533 07/10/18 0445  WBC 15.6* 17.9* 10.4 11.3*  NEUTROABS  --   --  7.5 7.8*  HGB 13.5 13.9 12.7 13.3  HCT 42.0 43.1 39.1 40.5  MCV 101.2* 100.7* 99.7 98.3  PLT 524* 498* 437* 469   Basic Metabolic Panel: Recent Labs  Lab 07/06/18 1743 07/07/18 0010 07/08/18 0533 07/10/18 0445  NA 136 137 138 138  K 4.4 4.3 3.7 3.9  CL 102 100 103 103  CO2 22 25 25 26   GLUCOSE 142* 103* 92 102*  BUN 11 10 9  5*  CREATININE 0.75 0.72 0.64 0.55  CALCIUM 9.0 9.1 8.3* 8.5*  MG  --   --  1.9 1.9  PHOS  --   --  3.6 3.5   GFR: Estimated Creatinine Clearance: 88.9 mL/min (by C-G formula based on SCr of 0.55 mg/dL). Liver Function Tests: Recent Labs  Lab 07/07/18 0010 07/08/18 0533  AST 47*  --   ALT 81*  --   ALKPHOS 140*  --   BILITOT 0.6  --   PROT 7.0   6.9  --   ALBUMIN 3.3*   3.3* 2.3*   No results for input(s): LIPASE, AMYLASE in the last 168 hours. No results for input(s): AMMONIA in the last 168 hours. Coagulation Profile: Recent Labs  Lab 07/07/18 0010  INR 1.0   Cardiac Enzymes: Recent Labs  Lab 07/06/18 1743  TROPONINI <0.03   BNP (last 3 results) No results for input(s): PROBNP in the last 8760 hours. HbA1C: No results for input(s): HGBA1C in the last 72 hours. CBG: Recent Labs  Lab 07/07/18 0722 07/08/18 0745 07/09/18 0758 07/10/18 0726 07/11/18 0800  GLUCAP 86 85 98 136* 150*   Lipid Profile: No results for input(s): CHOL, HDL, LDLCALC, TRIG, CHOLHDL, LDLDIRECT in the last 72 hours. Thyroid Function Tests: No results for input(s): TSH, T4TOTAL, FREET4, T3FREE, THYROIDAB in the last 72 hours. Anemia Panel: No results for input(s): VITAMINB12, FOLATE, FERRITIN, TIBC, IRON, RETICCTPCT in the last 72 hours. Urine analysis: No results found for: COLORURINE, APPEARANCEUR, LABSPEC, PHURINE, GLUCOSEU, HGBUR, BILIRUBINUR, KETONESUR,  PROTEINUR, UROBILINOGEN, NITRITE, LEUKOCYTESUR Sepsis Labs: Invalid input(s): PROCALCITONIN, LACTICIDVEN  Recent Results (from the past 240 hour(s))  SARS Coronavirus 2 (CEPHEID- Performed in [place] hospital lab), Hosp Order     Status: None   Collection Time: 07/06/18  8:37 PM   Specimen: Nasopharyngeal Swab  Result Value Ref Range Status   SARS Coronavirus 2 NEGATIVE NEGATIVE Final    Comment: (NOTE) If result is NEGATIVE SARS-CoV-2 target nucleic acids are NOT DETECTED. The SARS-CoV-2 RNA is generally detectable in upper and lower  respiratory specimens during the acute phase of infection. The lowest  concentration of SARS-CoV-2 viral copies this assay can detect is 250  copies / mL. A negative result does not preclude SARS-CoV-2 infection  and should not be used as the sole basis for treatment or other  patient management decisions.  A negative result may occur with  improper specimen collection / handling, submission of specimen other  than nasopharyngeal swab, presence of viral mutation(s) within the  areas targeted by this assay, and inadequate number of  viral copies  (<250 copies / mL). A negative result must be combined with clinical  observations, patient history, and epidemiological information. If result is POSITIVE SARS-CoV-2 target nucleic acids are DETECTED. The SARS-CoV-2 RNA is generally detectable in upper and lower  respiratory specimens dur ing the acute phase of infection.  Positive  results are indicative of active infection with SARS-CoV-2.  Clinical  correlation with patient history and other diagnostic information is  necessary to determine patient infection status.  Positive results do  not rule out bacterial infection or co-infection with other viruses. If result is PRESUMPTIVE POSTIVE SARS-CoV-2 nucleic acids MAY BE PRESENT.   A presumptive positive result was obtained on the submitted specimen  and confirmed on repeat testing.  While 2019 novel  coronavirus  (SARS-CoV-2) nucleic acids may be present in the submitted sample  additional confirmatory testing may be necessary for epidemiological  and / or clinical management purposes  to differentiate between  SARS-CoV-2 and other Sarbecovirus currently known to infect humans.  If clinically indicated additional testing with an alternate test  methodology [phone]) is advised. The SARS-CoV-2 RNA is generally  detectable in upper and lower respiratory sp ecimens during the acute  phase of infection. The expected result is Negative. Fact Sheet for Patients:  StrictlyIdeas.no Fact Sheet for Healthcare Providers: BankingDealers.co.za This test is not yet approved or cleared by the [place] FDA and has been authorized for detection and/or diagnosis of SARS-CoV-2 by FDA under an Emergency Use Authorization (EUA).  This EUA will remain in effect (meaning this test can be used) for the duration of the COVID-19 declaration under Section 564(b)(1) of the Act, 21 U.S.C. section 360bbb-3(b)(1), unless the authorization is terminated or revoked sooner. Performed at [facility], [place] [address], [place], [place] [address]   Culture, blood (Routine X 2) w Reflex to ID Panel     Status: None (Preliminary result)   Collection Time: 07/07/18 12:05 AM   Specimen: BLOOD  Result Value Ref Range Status   Specimen Description BLOOD RIGHT ARM  Final   Special Requests   Final    BOTTLES DRAWN AEROBIC ONLY Blood Culture adequate volume   Culture   Final    NO GROWTH 4 DAYS Performed at [facility], [place] [address], [place], [place] [address]    Report Status PENDING  Incomplete  Culture, blood (Routine X 2) w Reflex to ID Panel     Status: None (Preliminary result)   Collection Time: 07/07/18 12:10 AM   Specimen: BLOOD  Result Value Ref Range Status   Specimen Description BLOOD RIGHT ARM  Final   Special Requests   Final    BOTTLES  DRAWN AEROBIC ONLY Blood Culture adequate volume   Culture   Final    NO GROWTH 4 DAYS Performed at [facility], [place] [address] [place], [place] [address]    Report Status PENDING  Incomplete  MRSA PCR Screening     Status: None   Collection Time: 07/07/18  1:42 AM   Specimen: Nasal Mucosa; Nasopharyngeal  Result Value Ref Range Status   MRSA by PCR NEGATIVE NEGATIVE Final    Comment:        The GeneXpert MRSA Assay (FDA approved for NASAL specimens only), is one component of a comprehensive MRSA colonization surveillance program. It is not intended to diagnose MRSA infection nor to guide or monitor treatment for MRSA infections. Performed at [facility], [place] [address],  [place], [place] [address]   Fungus Culture With Stain     Status: None (Preliminary result)   Collection Time: 07/07/18 10:10 AM   Specimen: Pleural Fluid  Result Value Ref Range Status   Fungus Stain Final report  Final    Comment: (NOTE) Performed At: [facility] [place], [place] [id number] Rush Farmer MD [id number]    Fungus (Mycology) Culture PENDING  Incomplete   Fungal Source PLEURAL  Final    Comment: LEFT Performed at [facility], [place] [address] [place], [place] [address]   Culture, body fluid-bottle     Status: None (Preliminary result)   Collection Time: 07/07/18 10:10 AM   Specimen: Pleura  Result Value Ref Range Status   Specimen Description PLEURAL LEFT  Final   Special Requests NONE  Final   Culture   Final    NO GROWTH 4 DAYS Performed at [place] [address] [place]  [address]    Report Status PENDING  Incomplete  Gram stain     Status: None   Collection Time: 07/07/18 10:10 AM   Specimen: Pleura  Result Value Ref Range Status   Specimen Description PLEURAL LEFT  Final   Special Requests NONE  Final   Gram Stain   Final    ABUNDANT WBC PRESENT,BOTH PMN AND MONONUCLEAR NO ORGANISMS SEEN Performed at  [facility], [address] [place], [place] [address]    Report Status 07/07/2018 FINAL  Final  Acid Fast Smear (AFB)     Status: None   Collection Time: 07/07/18 10:10 AM   Specimen: Pleural Fluid  Result Value Ref Range Status   AFB Specimen Processing Comment  Final    Comment: Tissue Grinding and Digestion/Decontamination   Acid Fast Smear Negative  Final    Comment: (NOTE) Performed At: [facility] [address] [place], [place] [id number] Rush Farmer MD QI:[id number]    Source (AFB) PLEURAL  Final    Comment: Performed at [facility], [place] [address] [place], [place] [address]  Fungus Culture Result     Status: None   Collection Time: 07/07/18 10:10 AM  Result Value Ref Range Status   Result 1 Comment  Final    Comment: (NOTE) KOH/Calcofluor preparation:  no fungus observed. Performed At: [facility] [place], [place] [id number] Rush Farmer MD [id number]   Expectorated sputum assessment w rflx to resp cult     Status: None   Collection Time: 07/07/18  5:55 PM   Specimen: Expectorated Sputum  Result Value Ref Range Status   Specimen Description EXPECTORATED SPUTUM  Final   Special Requests NONE  Final   Sputum evaluation   Final    THIS SPECIMEN IS ACCEPTABLE FOR SPUTUM CULTURE Performed at [facility], [address] [place], [place] [address]    Report Status 07/07/2018 FINAL  Final  Culture, respiratory     Status: None   Collection Time: 07/07/18  5:55 PM  Result Value Ref Range Status   Specimen Description EXPECTORATED SPUTUM  Final   Special Requests NONE Reflexed from [id number]  Final   Gram Stain   Final    ABUNDANT WBC PRESENT, PREDOMINANTLY PMN FEW GRAM POSITIVE COCCI    Culture   Final    Consistent with normal respiratory flora. Performed at [facility], [place] [address], [place],  [address]    Report Status 07/10/2018 FINAL  Final  Radiology Studies: Ct Abdomen  Pelvis W Contrast  Result Date: 07/10/2018 CLINICAL DATA:  Large left pleural effusion, suspect gyn primary malignancy EXAM: CT ABDOMEN AND PELVIS WITH CONTRAST TECHNIQUE: Multidetector CT imaging of the abdomen and pelvis was performed using the standard protocol following bolus administration of intravenous contrast. CONTRAST:  163mL OMNIPAQUE IOHEXOL 300 MG/ML  SOLN COMPARISON:  CT chest, 07/09/2018, CT abdomen pelvis, 05/10/2011 FINDINGS: Lower chest: Loculated left-sided pleural effusion with left-sided pleural drainage catheter in position. Small right pleural effusion. Hepatobiliary: No focal liver abnormality is seen. Status post cholecystectomy. No biliary dilatation. Pancreas: Unremarkable. No pancreatic ductal dilatation or surrounding inflammatory changes. Spleen: Normal in size without significant abnormality. Adrenals/Urinary Tract: Adrenal glands are unremarkable. Kidneys are normal, without renal calculi, solid lesion, or hydronephrosis. Bladder is unremarkable. Stomach/Bowel: Stomach is within normal limits. Appendix appears normal. No evidence of bowel wall thickening, distention, or inflammatory changes. Large burden of stool in the colon. Vascular/Lymphatic: No significant vascular findings are present. No enlarged abdominal or pelvic lymph nodes. Reproductive: The bilateral ovaries are enlarged by heterogeneous appearing cystic lesions, measuring 5.3 x 4.2 cm on the right (series 4, image 72) and 4.5 x 3.2 cm on the left (series 4, image 75). Other: No abdominal wall hernia or abnormality. Trace ascites. There is some suggestion of omental and peritoneal nodularity (e.g. Series 4, image 43). Musculoskeletal: No acute or significant osseous findings. IMPRESSION: 1. The bilateral ovaries are enlarged by heterogeneous appearing cystic lesions, measuring 5.3 x 4.2 cm on the right (series 4, image 72) and 4.5 x 3.2 cm on the left (series 4, image 75). Consider dedicated pelvic ultrasound and/or  pelvic MRI to further evaluate for solid components given high suspicion for GYN primary malignancy. 2. No other evidence of mass and no lymphadenopathy in the abdomen or pelvis. 3. Trace ascites. There is some suggestion of omental and peritoneal nodularity (e.g. Series 4, image 66), concerning for peritoneal metastatic disease. 4. Loculated left-sided pleural effusion with left-sided pleural drainage catheter in position. Small right pleural effusion. Electronically Signed   By: Eddie Candle M.D.   On: 07/10/2018 21:14   Dg Chest Port 1 View  Result Date: 07/11/2018 CLINICAL DATA:  48 year old female with a history of left-sided pleural effusion. EXAM: PORTABLE CHEST 1 VIEW COMPARISON:  07/10/2018, 07/09/2018, CT 07/09/2010 FINDINGS: Cardiomediastinal silhouette unchanged with the left heart border partially obscured by lung/pleural disease. Unchanged left thoracostomy tube. Contour of the medial aspect of the left upper lobe is visualized at the apex, the so-called "luftsichel sign", new from the comparison and representing partial collapse of the left upper lobe. Right lung relatively well aerated. No new confluent airspace disease Unchanged right upper extremity PICC. IMPRESSION: Unchanged left-sided thoracostomy tube, with hydropneumothorax. The fluid component is relatively unchanged at the left lung base, with new left upper lobe collapse and small gas component at the apex. Opacity at the left lung base likely a combination of pleural fluid/loculated pleural fluid and associated atelectasis/consolidation. Unchanged right upper extremity PICC. Electronically Signed   By: Corrie Mckusick D.O.   On: 07/11/2018 08:53    Marzetta Board, MD, PhD [facility]  Contact via  [email or web address]  [place] P: [phone] F: [phone]

## 2018-07-12 NOTE — Progress Notes (Signed)
Patient refused ABG 

## 2018-07-12 NOTE — Progress Notes (Signed)
Patient questioned need for ABG. Paged ordering Dr. No call back. Patient does not want to do ABG until she knows why she needs it.

## 2018-07-12 NOTE — Progress Notes (Addendum)
Procedure(s) (LRB): VIDEO ASSISTED THORACOSCOPY (Left) DRAINAGE OF LOCULATED PLEURAL EFFUSION (Left) Subjective: Pathology report on pleural fluid and results of abdominal pelvic CT scan discussed with patient. The large left pleural effusion she had on presentation is probably due to ovarian malignancy. Current pigtail catheter still draining 100 cc/day There is persistent loculated pleural fluid which will need VATS for drainage at which time talc basis will be performed to help reduce risk of recurrent malignant effusion.  Also I discussed placing a left Pleurx catheter with the patient for inability to control long-term problems with the malignant effusion if it does not respond to therapy.  Objective: Vital signs in last 24 hours: Temp:  [98 F (36.7 C)-98.9 F (37.2 C)] 98.3 F (36.8 C) (06/28 1133) Pulse Rate:  [76-107] 95 (06/28 1200) Cardiac Rhythm: Normal sinus rhythm (06/28 0800) Resp:  [15-22] 17 (06/28 1200) BP: (96-108)/(39-56) 96/46 (06/28 1133) SpO2:  [91 %-98 %] 98 % (06/28 1200)  Hemodynamic parameters for last 24 hours:  Stable Intake/Output from previous day: 06/27 0701 - 06/28 0700 In: 480 [P.O.:480] Out: 600 [Urine:600] Intake/Output this shift: Total I/O In: 240 [P.O.:240] Out: 300 [Urine:300]  Exam Left pigtail catheter in place with minimal drainage Breath sounds improved on left side Heart rate regular without murmur Abdomen nontender  Lab Results: Recent Labs    07/12/18 0709 07/12/18 1219  WBC 10.2 11.5*  HGB 12.2 12.8  HCT 37.2 38.4  PLT 355 347   BMET:  Recent Labs    07/12/18 0709 07/12/18 1219  NA 138 137  K 4.0 3.7  CL 100 101  CO2 28 26  GLUCOSE 104* 147*  BUN <5* <5*  CREATININE 0.57 0.59  CALCIUM 8.5* 8.6*    PT/INR:  Recent Labs    07/12/18 1219  LABPROT 13.7  INR 1.1   ABG No results found for: PHART, HCO3, TCO2, ACIDBASEDEF, O2SAT CBG (last 3)  Recent Labs    07/10/18 0726 07/11/18 0800  GLUCAP 136*  150*    Assessment/Plan: S/P Procedure(s) (LRB): VIDEO ASSISTED THORACOSCOPY (Left) DRAINAGE OF LOCULATED PLEURAL EFFUSION (Left) Plan left VATS tomorrow for drainage of loculated effusion biopsy and talc pleurodesis with possible Pleurx catheter placement.  Patient understands that indications or surgery alternatives and risks and agrees to proceed.   LOS: 5 days    Tharon Aquas Trigt III 07/12/2018

## 2018-07-12 NOTE — Progress Notes (Signed)
Pt ambulated in a hallway almost 250 ft, pt is aware of procedure tomorrow, consent been taken for procedure.lab all drawn as ordered via her PICC line. Blood consent taken, Blood bank  has 2 unit of blood  ready for surgery tomorrow. Pt is taking pain medicine around the clock for her left side chest tube insertion site. Will continue to monitor the patient  Palma Holter, RN

## 2018-07-13 ENCOUNTER — Encounter (HOSPITAL_COMMUNITY): Payer: Self-pay | Admitting: Anesthesiology

## 2018-07-13 ENCOUNTER — Inpatient Hospital Stay (HOSPITAL_COMMUNITY): Payer: Medicaid Other

## 2018-07-13 ENCOUNTER — Encounter (HOSPITAL_COMMUNITY)
Admission: EM | Disposition: A | Discharge status: Home Health | Payer: Self-pay | Source: Home / Self Care | Attending: Internal Medicine

## 2018-07-13 ENCOUNTER — Inpatient Hospital Stay (HOSPITAL_COMMUNITY): Payer: Medicaid Other | Admitting: Certified Registered"

## 2018-07-13 DIAGNOSIS — J910 Malignant pleural effusion: Secondary | ICD-10-CM

## 2018-07-13 HISTORY — PX: PLEURAL EFFUSION DRAINAGE: SHX5099

## 2018-07-13 HISTORY — PX: VIDEO ASSISTED THORACOSCOPY: SHX5073

## 2018-07-13 HISTORY — PX: TALC PLEURODESIS: SHX2506

## 2018-07-13 LAB — CA 125: Cancer Antigen (CA) 125: 933 U/mL — ABNORMAL HIGH (ref 0.0–38.1)

## 2018-07-13 LAB — GLUCOSE, CAPILLARY
Glucose-Capillary: 118 mg/dL — ABNORMAL HIGH (ref 70–99)
Glucose-Capillary: 214 mg/dL — ABNORMAL HIGH (ref 70–99)
Glucose-Capillary: 86 mg/dL (ref 70–99)

## 2018-07-13 LAB — BASIC METABOLIC PANEL
Anion gap: 7 (ref 5–15)
BUN: 6 mg/dL (ref 6–20)
CO2: 26 mmol/L (ref 22–32)
Calcium: 8.2 mg/dL — ABNORMAL LOW (ref 8.9–10.3)
Chloride: 104 mmol/L (ref 98–111)
Creatinine, Ser: 0.48 mg/dL (ref 0.44–1.00)
GFR calc Af Amer: >60 mL/min (ref >60)
GFR calc non Af Amer: >60 mL/min (ref >60)
Glucose, Bld: 102 mg/dL — ABNORMAL HIGH (ref 70–99)
Potassium: 4 mmol/L (ref 3.5–5.1)
Sodium: 137 mmol/L (ref 135–145)

## 2018-07-13 LAB — CBC
HCT: 35.4 % — ABNORMAL LOW (ref 36.0–46.0)
Hemoglobin: 11.4 g/dL — ABNORMAL LOW (ref 12.0–15.0)
MCH: 32.3 pg (ref 26.0–34.0)
MCHC: 32.2 g/dL (ref 30.0–36.0)
MCV: 100.3 fL — ABNORMAL HIGH (ref 80.0–100.0)
Platelets: 333 10*3/uL (ref 150–400)
RBC: 3.53 MIL/uL — ABNORMAL LOW (ref 3.87–5.11)
RDW: 12.5 % (ref 11.5–15.5)
WBC: 9.5 10*3/uL (ref 4.0–10.5)
nRBC: 0 % (ref 0.0–0.2)

## 2018-07-13 LAB — MISC LABCORP TEST (SEND OUT): Labcorp test code: 9985

## 2018-07-13 LAB — PROTIME-INR
INR: 1 (ref 0.8–1.2)
Prothrombin Time: 12.9 seconds (ref 11.4–15.2)

## 2018-07-13 SURGERY — VIDEO ASSISTED THORACOSCOPY
Anesthesia: General | Site: Chest | Laterality: Left

## 2018-07-13 MED ORDER — ROCURONIUM BROMIDE 10 MG/ML (PF) SYRINGE
PREFILLED_SYRINGE | INTRAVENOUS | Status: DC | PRN
  Administered 2018-07-13: 50 mg via INTRAVENOUS
  Administered 2018-07-13: 10 mg via INTRAVENOUS

## 2018-07-13 MED ORDER — BUPIVACAINE HCL (PF) 0.5 % IJ SOLN
INTRAMUSCULAR | Status: DC | PRN
  Administered 2018-07-13: 10 mL

## 2018-07-13 MED ORDER — TALC (STERITALC) POWDER FOR INTRAPLEURAL USE
INTRAPLEURAL | Status: AC
  Filled 2018-07-13: qty 8

## 2018-07-13 MED ORDER — BUPIVACAINE HCL (PF) 0.5 % IJ SOLN
INTRAMUSCULAR | Status: AC
  Filled 2018-07-13: qty 10

## 2018-07-13 MED ORDER — OXYCODONE-ACETAMINOPHEN 5-325 MG PO TABS
1.0000 | ORAL_TABLET | ORAL | Status: DC | PRN
  Administered 2018-07-13: 1 via ORAL
  Administered 2018-07-13: 2 via ORAL
  Filled 2018-07-13: qty 2

## 2018-07-13 MED ORDER — ROCURONIUM BROMIDE 10 MG/ML (PF) SYRINGE
PREFILLED_SYRINGE | INTRAVENOUS | Status: AC
  Filled 2018-07-13: qty 10

## 2018-07-13 MED ORDER — BUPIVACAINE 0.5 % ON-Q PUMP SINGLE CATH 400 ML
400.0000 mL | INJECTION | Status: DC
  Filled 2018-07-13: qty 400

## 2018-07-13 MED ORDER — ACETAMINOPHEN 10 MG/ML IV SOLN: 1000.0000 mg | Freq: Once | INTRAVENOUS | Status: DC | PRN

## 2018-07-13 MED ORDER — TRAMADOL HCL 50 MG PO TABS: 50.0000 mg | ORAL_TABLET | Freq: Four times a day (QID) | ORAL | Status: DC | PRN

## 2018-07-13 MED ORDER — HYDROMORPHONE HCL 1 MG/ML IJ SOLN
INTRAMUSCULAR | Status: AC
  Filled 2018-07-13: qty 1

## 2018-07-13 MED ORDER — LACTATED RINGERS IV SOLN
INTRAVENOUS | Status: DC | PRN
  Administered 2018-07-13: 16:00:00 via INTRAVENOUS

## 2018-07-13 MED ORDER — VANCOMYCIN HCL IN DEXTROSE 1-5 GM/200ML-% IV SOLN
INTRAVENOUS | Status: AC
  Filled 2018-07-13: qty 200

## 2018-07-13 MED ORDER — MIDAZOLAM HCL 2 MG/2ML IJ SOLN
INTRAMUSCULAR | Status: AC
  Filled 2018-07-13: qty 2

## 2018-07-13 MED ORDER — DIPHENHYDRAMINE HCL 50 MG/ML IJ SOLN: 12.5000 mg | Freq: Four times a day (QID) | INTRAMUSCULAR | Status: DC | PRN

## 2018-07-13 MED ORDER — FENTANYL 40 MCG/ML IV SOLN
INTRAVENOUS | Status: DC
  Administered 2018-07-13: 1000 ug via INTRAVENOUS
  Administered 2018-07-14: 225 ug via INTRAVENOUS
  Administered 2018-07-14: 150 ug via INTRAVENOUS
  Administered 2018-07-14 (×2): 165 ug via INTRAVENOUS
  Administered 2018-07-14: 1000 ug via INTRAVENOUS
  Administered 2018-07-14: 225 ug via INTRAVENOUS
  Administered 2018-07-14: 300 ug via INTRAVENOUS
  Administered 2018-07-15: 120 ug via INTRAVENOUS
  Administered 2018-07-15: 195 ug via INTRAVENOUS
  Filled 2018-07-13: qty 25
  Filled 2018-07-13: qty 1000

## 2018-07-13 MED ORDER — KETOROLAC TROMETHAMINE 30 MG/ML IJ SOLN
INTRAMUSCULAR | Status: DC | PRN
  Administered 2018-07-13: 30 mg via INTRAVENOUS

## 2018-07-13 MED ORDER — BUPIVACAINE 0.25 % ON-Q PUMP SINGLE CATH 400 ML: INJECTION | Status: DC | PRN

## 2018-07-13 MED ORDER — ALBUMIN HUMAN 5 % IV SOLN
INTRAVENOUS | Status: AC
  Filled 2018-07-13: qty 250

## 2018-07-13 MED ORDER — KETOROLAC TROMETHAMINE 30 MG/ML IJ SOLN
INTRAMUSCULAR | Status: AC
  Filled 2018-07-13: qty 1

## 2018-07-13 MED ORDER — ACETAMINOPHEN 160 MG/5ML PO SOLN: 1000.0000 mg | Freq: Four times a day (QID) | ORAL | Status: DC

## 2018-07-13 MED ORDER — LACTATED RINGERS IV SOLN
INTRAVENOUS | Status: DC
  Administered 2018-07-13: 15:00:00 via INTRAVENOUS

## 2018-07-13 MED ORDER — FENTANYL CITRATE (PF) 100 MCG/2ML IJ SOLN
INTRAMUSCULAR | Status: AC
  Filled 2018-07-13: qty 2

## 2018-07-13 MED ORDER — BISACODYL 5 MG PO TBEC
10.0000 mg | DELAYED_RELEASE_TABLET | Freq: Every day | ORAL | Status: DC
  Administered 2018-07-13 – 2018-07-16 (×3): 10 mg via ORAL
  Filled 2018-07-13 (×3): qty 2

## 2018-07-13 MED ORDER — ONDANSETRON HCL 4 MG/2ML IJ SOLN
4.0000 mg | Freq: Four times a day (QID) | INTRAMUSCULAR | Status: DC | PRN
  Administered 2018-07-14 – 2018-07-16 (×2): 4 mg via INTRAVENOUS
  Filled 2018-07-13 (×2): qty 2

## 2018-07-13 MED ORDER — PROPOFOL 10 MG/ML IV BOLUS
INTRAVENOUS | Status: DC | PRN
  Administered 2018-07-13: 140 mg via INTRAVENOUS

## 2018-07-13 MED ORDER — ONDANSETRON HCL 4 MG/2ML IJ SOLN: 4.0000 mg | Freq: Four times a day (QID) | INTRAMUSCULAR | Status: DC | PRN

## 2018-07-13 MED ORDER — TALC 5 G PL SUSR
INTRAPLEURAL | Status: DC | PRN
  Administered 2018-07-13: 4 g via INTRAPLEURAL

## 2018-07-13 MED ORDER — BUPIVACAINE 0.5 % ON-Q PUMP SINGLE CATH 400 ML
INJECTION | Status: AC | PRN
  Administered 2018-07-13: 400 mL

## 2018-07-13 MED ORDER — NALOXONE HCL 0.4 MG/ML IJ SOLN
0.4000 mg | INTRAMUSCULAR | Status: DC | PRN
  Filled 2018-07-13: qty 1

## 2018-07-13 MED ORDER — BUPIVACAINE ON-Q PAIN PUMP (FOR ORDER SET NO CHG)
INJECTION | Status: DC
  Filled 2018-07-13: qty 1

## 2018-07-13 MED ORDER — DEXAMETHASONE SODIUM PHOSPHATE 10 MG/ML IJ SOLN
INTRAMUSCULAR | Status: DC | PRN
  Administered 2018-07-13: 5 mg via INTRAVENOUS

## 2018-07-13 MED ORDER — SODIUM CHLORIDE 0.9% FLUSH: 9.0000 mL | INTRAVENOUS | Status: DC | PRN

## 2018-07-13 MED ORDER — MEPERIDINE HCL 25 MG/ML IJ SOLN: 6.2500 mg | INTRAMUSCULAR | Status: DC | PRN

## 2018-07-13 MED ORDER — KETOROLAC TROMETHAMINE 15 MG/ML IJ SOLN
15.0000 mg | Freq: Three times a day (TID) | INTRAMUSCULAR | Status: AC
  Administered 2018-07-13 – 2018-07-15 (×5): 15 mg via INTRAVENOUS
  Filled 2018-07-13 (×5): qty 1

## 2018-07-13 MED ORDER — FENTANYL CITRATE (PF) 250 MCG/5ML IJ SOLN
INTRAMUSCULAR | Status: DC | PRN
  Administered 2018-07-13: 50 ug via INTRAVENOUS
  Administered 2018-07-13: 150 ug via INTRAVENOUS
  Administered 2018-07-13 (×6): 50 ug via INTRAVENOUS
  Administered 2018-07-13: 100 ug via INTRAVENOUS
  Administered 2018-07-13 (×2): 50 ug via INTRAVENOUS

## 2018-07-13 MED ORDER — SUMATRIPTAN SUCCINATE 50 MG PO TABS
50.0000 mg | ORAL_TABLET | ORAL | Status: DC | PRN
  Filled 2018-07-13: qty 1

## 2018-07-13 MED ORDER — ENOXAPARIN SODIUM 40 MG/0.4ML ~~LOC~~ SOLN
40.0000 mg | Freq: Every day | SUBCUTANEOUS | Status: DC
  Administered 2018-07-14 – 2018-07-15 (×2): 40 mg via SUBCUTANEOUS
  Filled 2018-07-13 (×2): qty 0.4

## 2018-07-13 MED ORDER — FENTANYL CITRATE (PF) 250 MCG/5ML IJ SOLN
INTRAMUSCULAR | Status: AC
  Filled 2018-07-13: qty 5

## 2018-07-13 MED ORDER — DEXTROSE-NACL 5-0.45 % IV SOLN
INTRAVENOUS | Status: DC
  Administered 2018-07-13 – 2018-07-14 (×3): via INTRAVENOUS

## 2018-07-13 MED ORDER — DIPHENHYDRAMINE HCL 12.5 MG/5ML PO ELIX: 12.5000 mg | ORAL_SOLUTION | Freq: Four times a day (QID) | ORAL | Status: DC | PRN

## 2018-07-13 MED ORDER — ACETAMINOPHEN 325 MG PO TABS: 325.0000 mg | ORAL_TABLET | Freq: Once | ORAL | Status: DC | PRN

## 2018-07-13 MED ORDER — SENNOSIDES-DOCUSATE SODIUM 8.6-50 MG PO TABS
1.0000 | ORAL_TABLET | Freq: Every day | ORAL | Status: DC
  Administered 2018-07-13 – 2018-07-15 (×3): 1 via ORAL
  Filled 2018-07-13 (×3): qty 1

## 2018-07-13 MED ORDER — ONDANSETRON HCL 4 MG/2ML IJ SOLN
INTRAMUSCULAR | Status: AC
  Filled 2018-07-13: qty 2

## 2018-07-13 MED ORDER — ACETAMINOPHEN 500 MG PO TABS
1000.0000 mg | ORAL_TABLET | Freq: Four times a day (QID) | ORAL | Status: DC
  Administered 2018-07-14 (×2): 1000 mg via ORAL
  Filled 2018-07-13 (×2): qty 2

## 2018-07-13 MED ORDER — HYDROMORPHONE HCL 1 MG/ML IJ SOLN
0.2500 mg | INTRAMUSCULAR | Status: DC | PRN
  Administered 2018-07-13: 0.5 mg via INTRAVENOUS

## 2018-07-13 MED ORDER — MIDAZOLAM HCL 2 MG/2ML IJ SOLN
INTRAMUSCULAR | Status: DC | PRN
  Administered 2018-07-13: 2 mg via INTRAVENOUS

## 2018-07-13 MED ORDER — SUGAMMADEX SODIUM 200 MG/2ML IV SOLN
INTRAVENOUS | Status: DC | PRN
  Administered 2018-07-13: 200 mg via INTRAVENOUS

## 2018-07-13 MED ORDER — DEXAMETHASONE SODIUM PHOSPHATE 10 MG/ML IJ SOLN
INTRAMUSCULAR | Status: AC
  Filled 2018-07-13: qty 1

## 2018-07-13 MED ORDER — CALCIUM CHLORIDE 10 % IV SOLN
INTRAVENOUS | Status: AC
  Filled 2018-07-13: qty 10

## 2018-07-13 MED ORDER — ACETAMINOPHEN 160 MG/5ML PO SOLN: 325.0000 mg | Freq: Once | ORAL | Status: DC | PRN

## 2018-07-13 MED ORDER — PROTAMINE SULFATE 10 MG/ML IV SOLN
INTRAVENOUS | Status: AC
  Filled 2018-07-13: qty 15

## 2018-07-13 MED ORDER — LIDOCAINE 2% (20 MG/ML) 5 ML SYRINGE
INTRAMUSCULAR | Status: DC | PRN
  Administered 2018-07-13: 60 mg via INTRAVENOUS

## 2018-07-13 MED ORDER — PROMETHAZINE HCL 25 MG/ML IJ SOLN: 6.2500 mg | INTRAMUSCULAR | Status: DC | PRN

## 2018-07-13 MED ORDER — VANCOMYCIN HCL IN DEXTROSE 1-5 GM/200ML-% IV SOLN
1000.0000 mg | Freq: Two times a day (BID) | INTRAVENOUS | Status: AC
  Administered 2018-07-14 (×2): 1000 mg via INTRAVENOUS
  Filled 2018-07-13: qty 200

## 2018-07-13 MED ORDER — FENTANYL CITRATE (PF) 100 MCG/2ML IJ SOLN
25.0000 ug | INTRAMUSCULAR | Status: DC | PRN
  Administered 2018-07-13 – 2018-07-15 (×4): 25 ug via INTRAVENOUS
  Filled 2018-07-13 (×4): qty 2

## 2018-07-13 MED ORDER — ALBUMIN HUMAN 5 % IV SOLN
12.5000 g | Freq: Once | INTRAVENOUS | Status: AC
  Administered 2018-07-13: 12.5 g via INTRAVENOUS

## 2018-07-13 MED ORDER — INSULIN ASPART 100 UNIT/ML ~~LOC~~ SOLN
0.0000 [IU] | SUBCUTANEOUS | Status: DC
  Administered 2018-07-14: 4 [IU] via SUBCUTANEOUS
  Administered 2018-07-14 (×2): 2 [IU] via SUBCUTANEOUS
  Administered 2018-07-14: 4 [IU] via SUBCUTANEOUS

## 2018-07-13 MED ORDER — LACTATED RINGERS IV SOLN: INTRAVENOUS | Status: DC

## 2018-07-13 SURGICAL SUPPLY — 75 items
ADH SKN CLS APL DERMABOND .7 (GAUZE/BANDAGES/DRESSINGS)
ADH SKN CLS LQ APL DERMABOND (GAUZE/BANDAGES/DRESSINGS) ×2
BAG DECANTER FOR FLEXI CONT (MISCELLANEOUS) IMPLANT
BLADE SURG 11 STRL SS (BLADE) ×4 IMPLANT
CANISTER SUCT 3000ML PPV (MISCELLANEOUS) ×4 IMPLANT
CATH KIT ON Q 5IN SLV (PAIN MANAGEMENT) IMPLANT
CATH KIT ON-Q SILVERSOAK 5 (CATHETERS) IMPLANT
CATH KIT ON-Q SILVERSOAK 5IN (CATHETERS) ×4 IMPLANT
CATH ROBINSON RED A/P 22FR (CATHETERS) IMPLANT
CATH THORACIC 28FR (CATHETERS) ×2 IMPLANT
CATH THORACIC 36FR (CATHETERS) IMPLANT
CATH THORACIC 36FR RT ANG (CATHETERS) IMPLANT
CONT SPEC 4OZ CLIKSEAL STRL BL (MISCELLANEOUS) ×8 IMPLANT
COVER SURGICAL LIGHT HANDLE (MISCELLANEOUS) ×8 IMPLANT
COVER WAND RF STERILE (DRAPES) ×4 IMPLANT
DERMABOND ADHESIVE PROPEN (GAUZE/BANDAGES/DRESSINGS) ×2
DERMABOND ADVANCED (GAUZE/BANDAGES/DRESSINGS)
DERMABOND ADVANCED .7 DNX12 (GAUZE/BANDAGES/DRESSINGS) IMPLANT
DERMABOND ADVANCED .7 DNX6 (GAUZE/BANDAGES/DRESSINGS) IMPLANT
DRAPE LAPAROSCOPIC ABDOMINAL (DRAPES) ×4 IMPLANT
DRAPE SLUSH/WARMER DISC (DRAPES) ×4 IMPLANT
ELECT REM PT RETURN 9FT ADLT (ELECTROSURGICAL) ×4
ELECTRODE REM PT RTRN 9FT ADLT (ELECTROSURGICAL) ×2 IMPLANT
GAUZE SPONGE 4X4 12PLY STRL (GAUZE/BANDAGES/DRESSINGS) ×4 IMPLANT
GAUZE SPONGE 4X4 12PLY STRL LF (GAUZE/BANDAGES/DRESSINGS) ×2 IMPLANT
GLOVE BIO SURGEON STRL SZ7.5 (GLOVE) ×10 IMPLANT
GLOVE BIOGEL M STRL SZ7.5 (GLOVE) ×4 IMPLANT
GLOVE BIOGEL PI IND STRL 7.5 (GLOVE) IMPLANT
GLOVE BIOGEL PI INDICATOR 7.5 (GLOVE) ×2
GOWN STRL REUS W/ TWL LRG LVL3 (GOWN DISPOSABLE) ×6 IMPLANT
GOWN STRL REUS W/TWL LRG LVL3 (GOWN DISPOSABLE) ×12
KIT BASIN OR (CUSTOM PROCEDURE TRAY) ×4 IMPLANT
KIT PLEURX DRAIN CATH 15.5FR (DRAIN) ×2 IMPLANT
KIT SUCTION CATH 14FR (SUCTIONS) ×4 IMPLANT
KIT TURNOVER KIT B (KITS) ×4 IMPLANT
NS IRRIG 1000ML POUR BTL (IV SOLUTION) ×8 IMPLANT
PACK CHEST (CUSTOM PROCEDURE TRAY) ×4 IMPLANT
PAD ARMBOARD 7.5X6 YLW CONV (MISCELLANEOUS) ×8 IMPLANT
SEALANT SURG COSEAL 4ML (VASCULAR PRODUCTS) IMPLANT
SOLUTION ANTI FOG 6CC (MISCELLANEOUS) ×4 IMPLANT
SPONGE TONSIL TAPE 1 RFD (DISPOSABLE) ×4 IMPLANT
SUT CHROMIC 3 0 SH 27 (SUTURE) IMPLANT
SUT ETHILON 3 0 FSL (SUTURE) ×4 IMPLANT
SUT ETHILON 3 0 PS 1 (SUTURE) IMPLANT
SUT PROLENE 3 0 SH DA (SUTURE) IMPLANT
SUT PROLENE 4 0 RB 1 (SUTURE)
SUT PROLENE 4-0 RB1 .5 CRCL 36 (SUTURE) IMPLANT
SUT SILK  1 MH (SUTURE) ×4
SUT SILK 1 MH (SUTURE) ×4 IMPLANT
SUT SILK 2 0 SH (SUTURE) ×2 IMPLANT
SUT SILK 2 0SH CR/8 30 (SUTURE) IMPLANT
SUT SILK 3 0SH CR/8 30 (SUTURE) IMPLANT
SUT VIC AB 1 CTX 18 (SUTURE) ×2 IMPLANT
SUT VIC AB 2 TP1 27 (SUTURE) ×2 IMPLANT
SUT VIC AB 2-0 CT1 27 (SUTURE) ×4
SUT VIC AB 2-0 CT1 TAPERPNT 27 (SUTURE) IMPLANT
SUT VIC AB 2-0 CT2 18 VCP726D (SUTURE) IMPLANT
SUT VIC AB 2-0 CTX 36 (SUTURE) ×2 IMPLANT
SUT VIC AB 3-0 SH 18 (SUTURE) ×2 IMPLANT
SUT VIC AB 3-0 X1 27 (SUTURE) ×2 IMPLANT
SUT VICRYL 0 UR6 27IN ABS (SUTURE) IMPLANT
SUT VICRYL 2 TP 1 (SUTURE) ×2 IMPLANT
SWAB COLLECTION DEVICE MRSA (MISCELLANEOUS) ×2 IMPLANT
SWAB CULTURE ESWAB REG 1ML (MISCELLANEOUS) IMPLANT
SYSTEM SAHARA CHEST DRAIN ATS (WOUND CARE) ×4 IMPLANT
SYSTEM SAHARA CHEST DRAIN RE-I (WOUND CARE) ×2 IMPLANT
TAPE CLOTH SURG 4X10 WHT LF (GAUZE/BANDAGES/DRESSINGS) ×2 IMPLANT
TIP APPLICATOR SPRAY EXTEND 16 (VASCULAR PRODUCTS) IMPLANT
TOWEL GREEN STERILE (TOWEL DISPOSABLE) ×4 IMPLANT
TOWEL GREEN STERILE FF (TOWEL DISPOSABLE) ×4 IMPLANT
TRAP SPECIMEN MUCOUS 40CC (MISCELLANEOUS) ×2 IMPLANT
TRAY FOLEY MTR SLVR 16FR STAT (SET/KITS/TRAYS/PACK) ×4 IMPLANT
TROCAR BLADELESS 5MM (ENDOMECHANICALS) ×2 IMPLANT
TUNNELER SHEATH ON-Q 11GX8 DSP (PAIN MANAGEMENT) IMPLANT
WATER STERILE IRR 1000ML POUR (IV SOLUTION) ×8 IMPLANT

## 2018-07-13 NOTE — Anesthesia Preprocedure Evaluation (Addendum)
Anesthesia Evaluation  Patient identified by MRN, date of birth, ID band Patient awake    Reviewed: Allergy & Precautions, NPO status , Patient's Chart, lab work & pertinent test results  Airway Mallampati: II  TM Distance: >3 FB Neck ROM: Full    Dental  (+) Teeth Intact, Dental Advisory Given   Pulmonary Current Smoker,     + decreased breath sounds      Cardiovascular negative cardio ROS   Rhythm:Regular Rate:Normal     Neuro/Psych  Headaches, Anxiety Depression    GI/Hepatic negative GI ROS, Neg liver ROS,   Endo/Other  negative endocrine ROS  Renal/GU negative Renal ROS     Musculoskeletal  (+) Arthritis ,   Abdominal Normal abdominal exam  (+)   Peds  Hematology negative hematology ROS (+)   Anesthesia Other Findings   Reproductive/Obstetrics                            Lab Results  Component Value Date   WBC 9.5 07/13/2018   HGB 11.4 (L) 07/13/2018   HCT 35.4 (L) 07/13/2018   MCV 100.3 (H) 07/13/2018   PLT 333 07/13/2018   Lab Results  Component Value Date   CREATININE 0.48 07/13/2018   BUN 6 07/13/2018   NA 137 07/13/2018   K 4.0 07/13/2018   CL 104 07/13/2018   CO2 26 07/13/2018   Lab Results  Component Value Date   INR 1.0 07/13/2018   INR 1.1 07/12/2018   INR 1.0 07/07/2018     Anesthesia Physical Anesthesia Plan  ASA: III  Anesthesia Plan: General   Post-op Pain Management:    Induction: Intravenous  PONV Risk Score and Plan: 3 and Ondansetron, Dexamethasone and Midazolam  Airway Management Planned: Double Lumen EBT  Additional Equipment: Arterial line  Intra-op Plan:   Post-operative Plan: Extubation in OR  Informed Consent: I have reviewed the patients History and Physical, chart, labs and discussed the procedure including the risks, benefits and alternatives for the proposed anesthesia with the patient or authorized representative who has  indicated his/her understanding and acceptance.     Dental advisory given  Plan Discussed with: CRNA  Anesthesia Plan Comments: (COVID-19 Labs  No results for input(s): DDIMER, FERRITIN, LDH, CRP in the last 72 hours.  Lab Results      Component                Value               Date                      SARSCOV2NAA              NEGATIVE            07/06/2018            )       Anesthesia Quick Evaluation

## 2018-07-13 NOTE — Anesthesia Procedure Notes (Signed)
Procedure Name: Intubation Date/Time: 07/13/2018 3:36 PM Performed by: Elayne Snare, CRNA Pre-anesthesia Checklist: Patient identified, Emergency Drugs available, Suction available and Patient being monitored Patient Re-evaluated:Patient Re-evaluated prior to induction Oxygen Delivery Method: Circle System Utilized Preoxygenation: Pre-oxygenation with 100% oxygen Induction Type: IV induction Laryngoscope Size: Mac and 3 Grade View: Grade I Endobronchial tube: Left, Double lumen EBT, EBT position confirmed by auscultation and EBT position confirmed by fiberoptic bronchoscope and 37 Fr Number of attempts: 1 Airway Equipment and Method: Stylet Placement Confirmation: ETT inserted through vocal cords under direct vision,  positive ETCO2 and breath sounds checked- equal and bilateral Tube secured with: Tape Dental Injury: Teeth and Oropharynx as per pre-operative assessment

## 2018-07-13 NOTE — Brief Op Note (Addendum)
07/06/2018 - 07/13/2018  5:16 PM  PATIENT:  Michelle Holt  48 y.o. female  PRE-OPERATIVE DIAGNOSIS:  LOCULATED EFFUSION  POST-OPERATIVE DIAGNOSIS:  LOCULATED EFFUSION  PROCEDURE:  Procedure(s): VIDEO ASSISTED THORACOSCOPY (Left) DRAINAGE OF LOCULATED PLEURAL EFFUSION (Left) Talc Pleuradesis (Left)  SURGEON:  Surgeon(s) and Role:    Ivin Poot, MD - Primary  PHYSICIAN ASSISTANT:  Nicholes Rough, PA-C   ANESTHESIA:   general  EBL:  50 mL   BLOOD ADMINISTERED:none  DRAINS: STRAIGHT CHEST TUBE AND A PLEURX DRAIN   LOCAL MEDICATIONS USED:  BUPIVICAINE  and OTHER ON-Q  SPECIMEN:  Source of Specimen:  pleural peel  DISPOSITION OF SPECIMEN:  PATHOLOGY  COUNTS:  YES  DICTATION: .Dragon Dictation  PLAN OF CARE: Admit to inpatient   PATIENT DISPOSITION:  PACU - hemodynamically stable.   Delay start of Pharmacological VTE agent (>24hrs) due to surgical blood loss or risk of bleeding: yes

## 2018-07-13 NOTE — Progress Notes (Signed)
Pt via bed to OR

## 2018-07-13 NOTE — Anesthesia Procedure Notes (Signed)
Arterial Line Insertion Start/End6/29/2020 2:30 PM Performed by: Leim Fabry T, CRNA, CRNA  Lidocaine 1% used for infiltration Right, radial was placed Catheter size: 20 G Hand hygiene performed  and maximum sterile barriers used  Allen's test indicative of satisfactory collateral circulation Attempts: 2 (attempt x1 by A. Holtzman, attempt x2 by K. Kathlen Mody, CRNA) Procedure performed without using ultrasound guided technique. Following insertion, dressing applied and Biopatch. Post procedure assessment: normal  Patient tolerated the procedure well with no immediate complications.

## 2018-07-13 NOTE — Progress Notes (Signed)
PROGRESS NOTE  Michelle Holt ZOX:[id number] DOB: Sep 01, 1970 DOA: 07/06/2018 PCP: [name], MD   LOS: 6 days   Brief Narrative / Interim history: 48 year old female with history of depression, anxiety, IBS, headaches, tobacco use was admitted to the hospital with shortness of breath.  Work-up revealed left-sided pleural effusion with compressive atelectasis and mediastinal shift.  She underwent thoracentesis by IR and pigtail catheter placement.  Cardiothoracic surgery following.  Subjective: Mild shortness of breath but not significant, awaiting surgery.  Denies any chest pain.  Assessment & Plan: Principal Problem:   Pleural effusion on left Active Problems:   Generalized anxiety disorder   Migraine   Acute respiratory failure with hypoxia (HCC)   Tobacco abuse   Pleural effusion   Principal Problem Acute hypoxic respiratory failure due to large left pleural effusion, concern for malignant effusion -CT chest on 6/25 showed a loculated left hydropneumothorax -Initial pathology of the fluid analysis was mentioned to be without evidence of malignancy however cardiothoracic note dated 6/26 mentioned that pathology called because there appears to be malignant cells, adenocarcinoma -Continue chest tube, she appears to be on the schedule for VATS for tomorrow -CT abdomen pelvis on 6/26 with ovarian findings concern for GYN primary malignancy -She was initially on broad-spectrum antibiotics but now narrowed to cefdinir for 5 days -Discussed at length with patient regarding malignant cells in the pleural fluid as well as concern for GYN malignancy.  She is unfortunately familiar with the process as her husband just died of lung cancer.  Ca1 25 pending -Plan for VATS today  Active Problems Generalized anxiety disorder -Continue Valium  Migraine headaches -PRN Imitrex  Tobacco abuse -We will need to quit   Scheduled Meds: . cefdinir  300 mg Oral Q12H  . polyethylene glycol   34 g Oral Daily  . senna  1 tablet Oral Daily  . sodium chloride flush  5 mL Intracatheter Q8H  . umeclidinium bromide  1 puff Inhalation Daily   Continuous Infusions: . vancomycin     PRN Meds:.acetaminophen **OR** acetaminophen, alum & mag hydroxide-simeth, dextromethorphan-guaiFENesin, diazepam, diphenhydrAMINE, nitroGLYCERIN, ondansetron **OR** ondansetron (ZOFRAN) IV, oxyCODONE-acetaminophen, SUMAtriptan  DVT prophylaxis: SCDs Code Status: Full code Family Communication: d/w patient Disposition Plan: home when ready   Consultants:   Thoracic surgery   Procedures:   Pigtail catheter placement   Antimicrobials:  Cefdinir   Objective: Vitals:   07/12/18 2028 07/12/18 2300 07/13/18 0300 07/13/18 0747  BP:  (!) 93/53 (!) 91/49 (!) 94/56  Pulse:  87 80 76  Resp:  14 15 15   Temp: 98.5 F (36.9 C) 98.2 F (36.8 C) 98.3 F (36.8 C) 98.3 F (36.8 C)  TempSrc: Oral Oral Oral Oral  SpO2: 95% 96% 93% 93%  Weight:      Height:        Intake/Output Summary (Last 24 hours) at 07/13/2018 1118 Last data filed at 07/12/2018 1600 Gross per 24 hour  Intake 480 ml  Output 600 ml  Net -120 ml   Filed Weights   07/06/18 2219 07/06/18 2343 07/10/18 2135  Weight: 74.8 kg 76.6 kg 74.8 kg    Examination:  Constitutional: NAD Respiratory: Diminished breath sounds at the left lung base but otherwise clear Cardiovascular: RRR  Data Reviewed: I have independently reviewed following labs and imaging studies   CBC: Recent Labs  Lab 07/08/18 0533 07/10/18 0445 07/12/18 0709 07/12/18 1219 07/13/18 0441  WBC 10.4 11.3* 10.2 11.5* 9.5  NEUTROABS 7.5 7.8*  --   --   --  HGB 12.7 13.3 12.2 12.8 11.4*  HCT 39.1 40.5 37.2 38.4 35.4*  MCV 99.7 98.3 99.2 98.5 100.3*  PLT 437* 364 355 347 401   Basic Metabolic Panel: Recent Labs  Lab 07/08/18 0533 07/10/18 0445 07/12/18 0709 07/12/18 1219 07/13/18 0441  NA 138 138 138 137 137  K 3.7 3.9 4.0 3.7 4.0  CL 103 103 100 101  104  CO2 25 26 28 26 26   GLUCOSE 92 102* 104* 147* 102*  BUN 9 5* <5* <5* 6  CREATININE 0.64 0.55 0.57 0.59 0.48  CALCIUM 8.3* 8.5* 8.5* 8.6* 8.2*  MG 1.9 1.9  --   --   --   PHOS 3.6 3.5  --   --   --    GFR: Estimated Creatinine Clearance: 88.9 mL/min (by C-G formula based on SCr of 0.48 mg/dL). Liver Function Tests: Recent Labs  Lab 07/07/18 0010 07/08/18 0533 07/12/18 1219  AST 47*  --  57*  ALT 81*  --  141*  ALKPHOS 140*  --  322*  BILITOT 0.6  --  0.6  PROT 7.0  6.9  --  5.8*  ALBUMIN 3.3*  3.3* 2.3* 2.2*   No results for input(s): LIPASE, AMYLASE in the last 168 hours. No results for input(s): AMMONIA in the last 168 hours. Coagulation Profile: Recent Labs  Lab 07/07/18 0010 07/12/18 1219 07/13/18 0441  INR 1.0 1.1 1.0   Cardiac Enzymes: Recent Labs  Lab 07/06/18 1743  TROPONINI <0.03   BNP (last 3 results) No results for input(s): PROBNP in the last 8760 hours. HbA1C: No results for input(s): HGBA1C in the last 72 hours. CBG: Recent Labs  Lab 07/08/18 0745 07/09/18 0758 07/10/18 0726 07/11/18 0800 07/13/18 0630  GLUCAP 85 98 136* 150* 86   Lipid Profile: No results for input(s): CHOL, HDL, LDLCALC, TRIG, CHOLHDL, LDLDIRECT in the last 72 hours. Thyroid Function Tests: No results for input(s): TSH, T4TOTAL, FREET4, T3FREE, THYROIDAB in the last 72 hours. Anemia Panel: No results for input(s): VITAMINB12, FOLATE, FERRITIN, TIBC, IRON, RETICCTPCT in the last 72 hours. Urine analysis:    Component Value Date/Time   COLORURINE YELLOW 07/12/2018 [place] 07/12/2018 1548   LABSPEC 1.010 07/12/2018 1548   PHURINE 8.0 07/12/2018 1548   GLUCOSEU NEGATIVE 07/12/2018 1548   HGBUR SMALL (A) 07/12/2018 1548   BILIRUBINUR NEGATIVE 07/12/2018 1548   KETONESUR NEGATIVE 07/12/2018 1548   PROTEINUR NEGATIVE 07/12/2018 1548   NITRITE NEGATIVE 07/12/2018 1548   LEUKOCYTESUR NEGATIVE 07/12/2018 1548   Sepsis Labs: Invalid input(s):  PROCALCITONIN, LACTICIDVEN  Recent Results (from the past 240 hour(s))  SARS Coronavirus 2 (CEPHEID- Performed in [place] hospital lab), Hosp Order     Status: None   Collection Time: 07/06/18  8:37 PM   Specimen: Nasopharyngeal Swab  Result Value Ref Range Status   SARS Coronavirus 2 NEGATIVE NEGATIVE Final    Comment: (NOTE) If result is NEGATIVE SARS-CoV-2 target nucleic acids are NOT DETECTED. The SARS-CoV-2 RNA is generally detectable in upper and lower  respiratory specimens during the acute phase of infection. The lowest  concentration of SARS-CoV-2 viral copies this assay can detect is 250  copies / mL. A negative result does not preclude SARS-CoV-2 infection  and should not be used as the sole basis for treatment or other  patient management decisions.  A negative result may occur with  improper specimen collection / handling, submission of specimen other  than nasopharyngeal swab, presence of viral mutation(s)  within the  areas targeted by this assay, and inadequate number of viral copies  (<250 copies / mL). A negative result must be combined with clinical  observations, patient history, and epidemiological information. If result is POSITIVE SARS-CoV-2 target nucleic acids are DETECTED. The SARS-CoV-2 RNA is generally detectable in upper and lower  respiratory specimens dur ing the acute phase of infection.  Positive  results are indicative of active infection with SARS-CoV-2.  Clinical  correlation with patient history and other diagnostic information is  necessary to determine patient infection status.  Positive results do  not rule out bacterial infection or co-infection with other viruses. If result is PRESUMPTIVE POSTIVE SARS-CoV-2 nucleic acids MAY BE PRESENT.   A presumptive positive result was obtained on the submitted specimen  and confirmed on repeat testing.  While 2019 novel coronavirus  (SARS-CoV-2) nucleic acids may be present in the submitted sample   additional confirmatory testing may be necessary for epidemiological  and / or clinical management purposes  to differentiate between  SARS-CoV-2 and other Sarbecovirus currently known to infect humans.  If clinically indicated additional testing with an alternate test  methodology [phone]) is advised. The SARS-CoV-2 RNA is generally  detectable in upper and lower respiratory sp ecimens during the acute  phase of infection. The expected result is Negative. Fact Sheet for Patients:  StrictlyIdeas.no Fact Sheet for Healthcare Providers: BankingDealers.co.za This test is not yet approved or cleared by the [place] FDA and has been authorized for detection and/or diagnosis of SARS-CoV-2 by FDA under an Emergency Use Authorization (EUA).  This EUA will remain in effect (meaning this test can be used) for the duration of the COVID-19 declaration under Section 564(b)(1) of the Act, 21 U.S.C. section 360bbb-3(b)(1), unless the authorization is terminated or revoked sooner. Performed at [facility], [place] [address] [place], [place] [address]   Culture, blood (Routine X 2) w Reflex to ID Panel     Status: None   Collection Time: 07/07/18 12:05 AM   Specimen: BLOOD  Result Value Ref Range Status   Specimen Description BLOOD RIGHT ARM  Final   Special Requests   Final    BOTTLES DRAWN AEROBIC ONLY Blood Culture adequate volume   Culture   Final    NO GROWTH 5 DAYS Performed at [facility], [place] [address] [place], [place] [address]    Report Status 07/12/2018 FINAL  Final  Culture, blood (Routine X 2) w Reflex to ID Panel     Status: None   Collection Time: 07/07/18 12:10 AM   Specimen: BLOOD  Result Value Ref Range Status   Specimen Description BLOOD RIGHT ARM  Final   Special Requests   Final    BOTTLES DRAWN AEROBIC ONLY Blood Culture adequate volume   Culture   Final    NO GROWTH 5 DAYS Performed at [facility], [place] [address] [place], [place] [address]    Report Status 07/12/2018 FINAL  Final  MRSA PCR Screening     Status: None   Collection Time: 07/07/18  1:42 AM   Specimen: Nasal Mucosa; Nasopharyngeal  Result Value Ref Range Status   MRSA by PCR NEGATIVE NEGATIVE Final    Comment:        The GeneXpert MRSA Assay (FDA approved for NASAL specimens only), is one component of a comprehensive MRSA colonization surveillance program. It is not intended to diagnose MRSA infection nor to guide or monitor treatment for MRSA infections.  Performed at [facility], [place] [address] [place], [place] [address]   Fungus Culture With Stain     Status: None (Preliminary result)   Collection Time: 07/07/18 10:10 AM   Specimen: Pleural Fluid  Result Value Ref Range Status   Fungus Stain Final report  Final    Comment: (NOTE) Performed At: [facility] [place], [place] [id number] Rush Farmer MD YM:[id number]    Fungus (Mycology) Culture PENDING  Incomplete   Fungal Source PLEURAL  Final    Comment: LEFT Performed at [facility], [place] [address], [place], [place] [address]   Culture, body fluid-bottle     Status: None   Collection Time: 07/07/18 10:10 AM   Specimen: Pleura  Result Value Ref Range Status   Specimen Description PLEURAL LEFT  Final   Special Requests NONE  Final   Culture   Final    NO GROWTH 5 DAYS Performed at [place] [address] [place], [place] [address]    Report Status 07/12/2018 FINAL  Final  Gram stain     Status: None   Collection Time: 07/07/18 10:10 AM   Specimen: Pleura  Result Value Ref Range Status   Specimen Description PLEURAL LEFT  Final   Special Requests NONE  Final   Gram Stain   Final    ABUNDANT WBC PRESENT,BOTH PMN AND MONONUCLEAR NO ORGANISMS SEEN Performed at [facility], [place] [address], [place], [place] [address]    Report Status 07/07/2018 FINAL  Final  Acid Fast Smear (AFB)      Status: None   Collection Time: 07/07/18 10:10 AM   Specimen: Pleural Fluid  Result Value Ref Range Status   AFB Specimen Processing Comment  Final    Comment: Tissue Grinding and Digestion/Decontamination   Acid Fast Smear Negative  Final    Comment: (NOTE) Performed At: [facility] [address] [place], [place] [id number] Rush Farmer MD [id number]    Source (AFB) PLEURAL  Final    Comment: Performed at [facility], [place] [address] [place], [place] [address]  Fungus Culture Result     Status: None   Collection Time: 07/07/18 10:10 AM  Result Value Ref Range Status   Result 1 Comment  Final    Comment: (NOTE) KOH/Calcofluor preparation:  no fungus observed. Performed At: [facility] [place], [place] [id number] Rush Farmer MD YO:[id number]   Expectorated sputum assessment w rflx to resp cult     Status: None   Collection Time: 07/07/18  5:55 PM   Specimen: Expectorated Sputum  Result Value Ref Range Status   Specimen Description EXPECTORATED SPUTUM  Final   Special Requests NONE  Final   Sputum evaluation   Final    THIS SPECIMEN IS ACCEPTABLE FOR SPUTUM CULTURE Performed at [facility], [address], [place], [place] [address]    Report Status 07/07/2018 FINAL  Final  Culture, respiratory     Status: None   Collection Time: 07/07/18  5:55 PM  Result Value Ref Range Status   Specimen Description EXPECTORATED SPUTUM  Final   Special Requests NONE Reflexed from [id number]  Final   Gram Stain   Final    ABUNDANT WBC PRESENT, PREDOMINANTLY PMN FEW GRAM POSITIVE COCCI    Culture   Final    Consistent with normal respiratory flora. Performed at [facility], [place] [address], [place], [place] [address]  Report Status 07/10/2018 FINAL  Final      Radiology Studies: No results found.  Marzetta Board, MD, PhD [facility]  Contact via  [email or web address]  [place] P: [phone] F:  [phone]

## 2018-07-13 NOTE — Progress Notes (Signed)
Pre Procedure note for inpatients:   Michelle Holt has been scheduled for Procedure(s): VIDEO ASSISTED THORACOSCOPY (Left) DRAINAGE OF LOCULATED PLEURAL EFFUSION (Left) today. The various methods of treatment have been discussed with the patient. After consideration of the risks, benefits and treatment options the patient has consented to the planned procedure.   The patient has been seen and labs reviewed. There are no changes in the patient's condition to prevent proceeding with the planned procedure today.  Recent labs:  Lab Results  Component Value Date   WBC 9.5 07/13/2018   HGB 11.4 (L) 07/13/2018   HCT 35.4 (L) 07/13/2018   PLT 333 07/13/2018   GLUCOSE 102 (H) 07/13/2018   ALT 141 (H) 07/12/2018   AST 57 (H) 07/12/2018   NA 137 07/13/2018   K 4.0 07/13/2018   CL 104 07/13/2018   CREATININE 0.48 07/13/2018   BUN 6 07/13/2018   CO2 26 07/13/2018   TSH 1.492 07/08/2018   INR 1.0 07/13/2018    Len Childs, MD 07/13/2018 2:42 PM

## 2018-07-13 NOTE — Transfer of Care (Signed)
Immediate Anesthesia Transfer of Care Note  Patient: Michelle Holt  Procedure(s) Performed: VIDEO ASSISTED THORACOSCOPY (Left Chest) DRAINAGE OF LOCULATED PLEURAL EFFUSION (Left ) Talc Pleuradesis (Left Chest)  Patient Location: PACU  Anesthesia Type:General  Level of Consciousness: awake, alert  and patient cooperative  Airway & Oxygen Therapy: Patient Spontanous Breathing  Post-op Assessment: Report given to RN and Post -op Vital signs reviewed and stable  Post vital signs: Reviewed and stable  Last Vitals:  Vitals Value Taken Time  BP 102/61 07/13/18 1726  Temp    Pulse 100 07/13/18 1729  Resp 15 07/13/18 1729  SpO2 94 % 07/13/18 1729  Vitals shown include unvalidated device data.  Last Pain:  Vitals:   07/13/18 1203  TempSrc: Oral  PainSc:       Patients Stated Pain Goal: 0 (01/23/01 4961)  Complications: No apparent anesthesia complications

## 2018-07-14 ENCOUNTER — Inpatient Hospital Stay (HOSPITAL_COMMUNITY): Payer: Medicaid Other

## 2018-07-14 ENCOUNTER — Encounter (HOSPITAL_COMMUNITY): Payer: Self-pay | Admitting: Cardiothoracic Surgery

## 2018-07-14 LAB — TYPE AND SCREEN
ABO/RH(D): O POS
Antibody Screen: NEGATIVE
Unit division: 0
Unit division: 0

## 2018-07-14 LAB — POCT I-STAT 7, (LYTES, BLD GAS, ICA,H+H)
Acid-Base Excess: 2 mmol/L (ref 0.0–2.0)
Bicarbonate: 26.9 mmol/L (ref 20.0–28.0)
Calcium, Ion: 1.29 mmol/L (ref 1.15–1.40)
HCT: 36 % (ref 36.0–46.0)
Hemoglobin: 12.2 g/dL (ref 12.0–15.0)
O2 Saturation: 94 %
Potassium: 4.3 mmol/L (ref 3.5–5.1)
Sodium: 138 mmol/L (ref 135–145)
TCO2: 28 mmol/L (ref 22–32)
pCO2 arterial: 43.4 mmHg (ref 32.0–48.0)
pH, Arterial: 7.4 (ref 7.350–7.450)
pO2, Arterial: 70 mmHg — ABNORMAL LOW (ref 83.0–108.0)

## 2018-07-14 LAB — BASIC METABOLIC PANEL
Anion gap: 10 (ref 5–15)
BUN: <5 mg/dL — ABNORMAL LOW (ref 6–20)
CO2: 25 mmol/L (ref 22–32)
Calcium: 9.2 mg/dL (ref 8.9–10.3)
Chloride: 104 mmol/L (ref 98–111)
Creatinine, Ser: 0.58 mg/dL (ref 0.44–1.00)
GFR calc Af Amer: >60 mL/min (ref >60)
GFR calc non Af Amer: >60 mL/min (ref >60)
Glucose, Bld: 156 mg/dL — ABNORMAL HIGH (ref 70–99)
Potassium: 4.2 mmol/L (ref 3.5–5.1)
Sodium: 139 mmol/L (ref 135–145)

## 2018-07-14 LAB — CBC
HCT: 35.9 % — ABNORMAL LOW (ref 36.0–46.0)
Hemoglobin: 11.8 g/dL — ABNORMAL LOW (ref 12.0–15.0)
MCH: 32.4 pg (ref 26.0–34.0)
MCHC: 32.9 g/dL (ref 30.0–36.0)
MCV: 98.6 fL (ref 80.0–100.0)
Platelets: 397 10*3/uL (ref 150–400)
RBC: 3.64 MIL/uL — ABNORMAL LOW (ref 3.87–5.11)
RDW: 11.9 % (ref 11.5–15.5)
WBC: 18 10*3/uL — ABNORMAL HIGH (ref 4.0–10.5)
nRBC: 0 % (ref 0.0–0.2)

## 2018-07-14 LAB — GLUCOSE, CAPILLARY
Glucose-Capillary: 118 mg/dL — ABNORMAL HIGH (ref 70–99)
Glucose-Capillary: 124 mg/dL — ABNORMAL HIGH (ref 70–99)
Glucose-Capillary: 141 mg/dL — ABNORMAL HIGH (ref 70–99)
Glucose-Capillary: 144 mg/dL — ABNORMAL HIGH (ref 70–99)
Glucose-Capillary: 169 mg/dL — ABNORMAL HIGH (ref 70–99)
Glucose-Capillary: 180 mg/dL — ABNORMAL HIGH (ref 70–99)

## 2018-07-14 LAB — BPAM RBC
Blood Product Expiration Date: 202008012359
Blood Product Expiration Date: 202008012359
Unit Type and Rh: 5100
Unit Type and Rh: 5100

## 2018-07-14 MED ORDER — OXYCODONE-ACETAMINOPHEN 5-325 MG PO TABS
1.0000 | ORAL_TABLET | ORAL | Status: DC | PRN
  Administered 2018-07-14 – 2018-07-15 (×7): 2 via ORAL
  Filled 2018-07-14 (×7): qty 2

## 2018-07-14 MED ORDER — INSULIN ASPART 100 UNIT/ML ~~LOC~~ SOLN
0.0000 [IU] | Freq: Three times a day (TID) | SUBCUTANEOUS | Status: DC
  Administered 2018-07-14 – 2018-07-15 (×2): 2 [IU] via SUBCUTANEOUS

## 2018-07-14 MED ORDER — INSULIN ASPART 100 UNIT/ML ~~LOC~~ SOLN: 0.0000 [IU] | SUBCUTANEOUS | Status: DC

## 2018-07-14 MED ORDER — ACETAMINOPHEN 325 MG PO TABS
650.0000 mg | ORAL_TABLET | Freq: Four times a day (QID) | ORAL | Status: DC | PRN
  Administered 2018-07-14 – 2018-07-15 (×2): 650 mg via ORAL
  Filled 2018-07-14 (×2): qty 2

## 2018-07-14 MED ORDER — ALUM & MAG HYDROXIDE-SIMETH 200-200-20 MG/5ML PO SUSP
15.0000 mL | ORAL | Status: DC | PRN
  Administered 2018-07-14 – 2018-07-16 (×5): 15 mL via ORAL
  Filled 2018-07-14 (×5): qty 30

## 2018-07-14 NOTE — Progress Notes (Signed)
PROGRESS NOTE  Michelle Holt CMK:[id number] DOB: [birth year] DOA: 07/06/2018 PCP: [name], MD   LOS: 7 days   Brief Narrative / Interim history: 48 year old female with history of depression, anxiety, IBS, headaches, tobacco use was admitted to the hospital with shortness of breath.  Work-up revealed left-sided pleural effusion with compressive atelectasis and mediastinal shift.  She underwent thoracentesis by IR and pigtail catheter placement.  Cardiothoracic surgery following and she is status post VATS on 6/29  Subjective: Underwent VATS yesterday, her postop pain was severe but now much better.  She denies any shortness of breath.  She denies any chest pain.  No abdominal pain, no nausea or vomiting  Assessment & Plan: Principal Problem:   Pleural effusion on left Active Problems:   Generalized anxiety disorder   Migraine   Acute respiratory failure with hypoxia (HCC)   Tobacco abuse   Pleural effusion   Principal Problem Acute hypoxic respiratory failure due to large left pleural effusion, concern for malignant effusion -CT chest on 6/25 showed a loculated left hydropneumothorax -Fluid pathology shows malignant cells concerning for adenocarcinoma, possibly GYN source.  Ca 125 elevated.  Surgical pathology was sent from VATS and pending -Chest tube to be removed tomorrow per thoracic surgery and potentially she will go home on Thursday with a Pleurx -Patient asking to discuss with oncology however at this point without clear-cut pathology and not sure if she is best served by GYN oncology versus regular oncology, once pathology is back --even if preliminary--will call oncology.  I told patient that they may not see her in the hospital and may instead expedite an outpatient visit -She was initially on broad-spectrum antibiotics but now narrowed to cefdinir for 5 days  Active Problems Generalized anxiety disorder -Continue Valium.  She recently lost her husband to lung  cancer  Migraine headaches -PRN Imitrex  Tobacco abuse -will need to quit   Scheduled Meds:  bisacodyl  10 mg Oral Daily   enoxaparin (LOVENOX) injection  40 mg Subcutaneous Daily   fentaNYL   Intravenous Q4H   insulin aspart  0-24 Units Subcutaneous Q4H   ketorolac  15 mg Intravenous Q8H   senna-docusate  1 tablet Oral QHS   Continuous Infusions:  bupivacaine 0.5 % ON-Q pump SINGLE CATH 400 mL     bupivacaine ON-Q pain pump     dextrose 5 % and 0.45% NaCl 10 mL/hr at 07/14/18 0949   vancomycin 200 mL/hr at 07/14/18 0600   PRN Meds:.acetaminophen, fentaNYL (SUBLIMAZE) injection, naloxone **AND** sodium chloride flush, ondansetron (ZOFRAN) IV, oxyCODONE-acetaminophen, SUMAtriptan, traMADol  DVT prophylaxis: SCDs Code Status: Full code Family Communication: d/w patient Disposition Plan: home when ready   Consultants:   Thoracic surgery   Procedures:   Pigtail catheter placement   Antimicrobials:  Cefdinir   Objective: Vitals:   07/14/18 0600 07/14/18 0700 07/14/18 0756 07/14/18 0800  BP: (!) 102/50 (!) 99/58    Pulse: 79 69    Resp: 16 11  18   Temp:   98 F (36.7 C)   TempSrc:   Oral   SpO2: 96% 96%  95%  Weight: 74.7 kg     Height:        Intake/Output Summary (Last 24 hours) at 07/14/2018 1124 Last data filed at 07/14/2018 0900 Gross per 24 hour  Intake 1181.67 ml  Output 2450 ml  Net -1268.33 ml   Filed Weights   07/06/18 2343 07/10/18 2135 07/14/18 0600  Weight: 76.6 kg 74.8 kg 74.7 kg  Examination: Constitutional: NAD, calm, comfortable Eyes: PERRL, lids and conjunctivae normal ENMT: Mucous membranes are moist.  Neck: normal, supple Respiratory: Diminished on the left lung field, no wheezing, no crackles.  Normal respiratory effort Cardiovascular: Regular rate and rhythm, no murmurs / rubs / gallops. No LE edema. 2+ pedal pulses.  Abdomen: no tenderness. Bowel sounds positive.  Skin: no rashes, lesions, ulcers. No  induration Neurologic: non focal   Data Reviewed: I have independently reviewed following labs and imaging studies   CBC: Recent Labs  Lab 07/08/18 0533 07/10/18 0445 07/12/18 0709 07/12/18 1219 07/13/18 0441 07/14/18 0424 07/14/18 0438  WBC 10.4 11.3* 10.2 11.5* 9.5 18.0*  --   NEUTROABS 7.5 7.8*  --   --   --   --   --   HGB 12.7 13.3 12.2 12.8 11.4* 11.8* 12.2  HCT 39.1 40.5 37.2 38.4 35.4* 35.9* 36.0  MCV 99.7 98.3 99.2 98.5 100.3* 98.6  --   PLT 437* 364 355 347 333 397  --    Basic Metabolic Panel: Recent Labs  Lab 07/08/18 0533 07/10/18 0445 07/12/18 0709 07/12/18 1219 07/13/18 0441 07/14/18 0424 07/14/18 0438  NA 138 138 138 137 137 139 138  K 3.7 3.9 4.0 3.7 4.0 4.2 4.3  CL 103 103 100 101 104 104  --   CO2 25 26 28 26 26 25   --   GLUCOSE 92 102* 104* 147* 102* 156*  --   BUN 9 5* <5* <5* 6 <5*  --   CREATININE 0.64 0.55 0.57 0.59 0.48 0.58  --   CALCIUM 8.3* 8.5* 8.5* 8.6* 8.2* 9.2  --   MG 1.9 1.9  --   --   --   --   --   PHOS 3.6 3.5  --   --   --   --   --    GFR: Estimated Creatinine Clearance: 88.9 mL/min (by C-G formula based on SCr of 0.58 mg/dL). Liver Function Tests: Recent Labs  Lab 07/08/18 0533 07/12/18 1219  AST  --  57*  ALT  --  141*  ALKPHOS  --  322*  BILITOT  --  0.6  PROT  --  5.8*  ALBUMIN 2.3* 2.2*   No results for input(s): LIPASE, AMYLASE in the last 168 hours. No results for input(s): AMMONIA in the last 168 hours. Coagulation Profile: Recent Labs  Lab 07/12/18 1219 07/13/18 0441  INR 1.1 1.0   Cardiac Enzymes: No results for input(s): CKTOTAL, CKMB, CKMBINDEX, TROPONINI in the last 168 hours. BNP (last 3 results) No results for input(s): PROBNP in the last 8760 hours. HbA1C: No results for input(s): HGBA1C in the last 72 hours. CBG: Recent Labs  Lab 07/13/18 2052 07/13/18 2335 07/14/18 0006 07/14/18 0437 07/14/18 0746  GLUCAP 118* 214* 180* 169* 141*   Lipid Profile: No results for input(s): CHOL,  HDL, LDLCALC, TRIG, CHOLHDL, LDLDIRECT in the last 72 hours. Thyroid Function Tests: No results for input(s): TSH, T4TOTAL, FREET4, T3FREE, THYROIDAB in the last 72 hours. Anemia Panel: No results for input(s): VITAMINB12, FOLATE, FERRITIN, TIBC, IRON, RETICCTPCT in the last 72 hours. Urine analysis:    Component Value Date/Time   COLORURINE YELLOW 07/12/2018 [place] 07/12/2018 1548   LABSPEC 1.010 07/12/2018 1548   PHURINE 8.0 07/12/2018 1548   GLUCOSEU NEGATIVE 07/12/2018 1548   HGBUR SMALL (A) 07/12/2018 1548   BILIRUBINUR NEGATIVE 07/12/2018 [place] 07/12/2018 [place] 07/12/2018 1548  NITRITE NEGATIVE 07/12/2018 [place] 07/12/2018 1548   Sepsis Labs: Invalid input(s): PROCALCITONIN, LACTICIDVEN  Recent Results (from the past 240 hour(s))  SARS Coronavirus 2 (CEPHEID- Performed in [place] hospital lab), Hosp Order     Status: None   Collection Time: 07/06/18  8:37 PM   Specimen: Nasopharyngeal Swab  Result Value Ref Range Status   SARS Coronavirus 2 NEGATIVE NEGATIVE Final    Comment: (NOTE) If result is NEGATIVE SARS-CoV-2 target nucleic acids are NOT DETECTED. The SARS-CoV-2 RNA is generally detectable in upper and lower  respiratory specimens during the acute phase of infection. The lowest  concentration of SARS-CoV-2 viral copies this assay can detect is 250  copies / mL. A negative result does not preclude SARS-CoV-2 infection  and should not be used as the sole basis for treatment or other  patient management decisions.  A negative result may occur with  improper specimen collection / handling, submission of specimen other  than nasopharyngeal swab, presence of viral mutation(s) within the  areas targeted by this assay, and inadequate number of viral copies  (<250 copies / mL). A negative result must be combined with clinical  observations, patient history, and epidemiological  information. If result is POSITIVE SARS-CoV-2 target nucleic acids are DETECTED. The SARS-CoV-2 RNA is generally detectable in upper and lower  respiratory specimens dur ing the acute phase of infection.  Positive  results are indicative of active infection with SARS-CoV-2.  Clinical  correlation with patient history and other diagnostic information is  necessary to determine patient infection status.  Positive results do  not rule out bacterial infection or co-infection with other viruses. If result is PRESUMPTIVE POSTIVE SARS-CoV-2 nucleic acids MAY BE PRESENT.   A presumptive positive result was obtained on the submitted specimen  and confirmed on repeat testing.  While 2019 novel coronavirus  (SARS-CoV-2) nucleic acids may be present in the submitted sample  additional confirmatory testing may be necessary for epidemiological  and / or clinical management purposes  to differentiate between  SARS-CoV-2 and other Sarbecovirus currently known to infect humans.  If clinically indicated additional testing with an alternate test  methodology [phone]) is advised. The SARS-CoV-2 RNA is generally  detectable in upper and lower respiratory sp ecimens during the acute  phase of infection. The expected result is Negative. Fact Sheet for Patients:  StrictlyIdeas.no Fact Sheet for Healthcare Providers: BankingDealers.co.za This test is not yet approved or cleared by the [place] FDA and has been authorized for detection and/or diagnosis of SARS-CoV-2 by FDA under an Emergency Use Authorization (EUA).  This EUA will remain in effect (meaning this test can be used) for the duration of the COVID-19 declaration under Section 564(b)(1) of the Act, 21 U.S.C. section 360bbb-3(b)(1), unless the authorization is terminated or revoked sooner. Performed at [facility], [place] [address], [place], [place] [address]   Culture, blood (Routine X 2)  w Reflex to ID Panel     Status: None   Collection Time: 07/07/18 12:05 AM   Specimen: BLOOD  Result Value Ref Range Status   Specimen Description BLOOD RIGHT ARM  Final   Special Requests   Final    BOTTLES DRAWN AEROBIC ONLY Blood Culture adequate volume   Culture   Final    NO GROWTH 5 DAYS Performed at [facility], [place] [address] [place],  [address]    Report Status 07/12/2018 FINAL  Final  Culture, blood (Routine X 2)  w Reflex to ID Panel     Status: None   Collection Time: 07/07/18 12:10 AM   Specimen: BLOOD  Result Value Ref Range Status   Specimen Description BLOOD RIGHT ARM  Final   Special Requests   Final    BOTTLES DRAWN AEROBIC ONLY Blood Culture adequate volume   Culture   Final    NO GROWTH 5 DAYS Performed at [facility], [address] [place], [place] [address]    Report Status 07/12/2018 FINAL  Final  MRSA PCR Screening     Status: None   Collection Time: 07/07/18  1:42 AM   Specimen: Nasal Mucosa; Nasopharyngeal  Result Value Ref Range Status   MRSA by PCR NEGATIVE NEGATIVE Final    Comment:        The GeneXpert MRSA Assay (FDA approved for NASAL specimens only), is one component of a comprehensive MRSA colonization surveillance program. It is not intended to diagnose MRSA infection nor to guide or monitor treatment for MRSA infections. Performed at [facility], [place] [address], [place], [place] [address]   Fungus Culture With Stain     Status: None (Preliminary result)   Collection Time: 07/07/18 10:10 AM   Specimen: Pleural Fluid  Result Value Ref Range Status   Fungus Stain Final report  Final    Comment: (NOTE) Performed At: [facility] [place], [place] [id number] Rush Farmer MD YB:[id number]    Fungus (Mycology) Culture PENDING  Incomplete   Fungal Source PLEURAL  Final    Comment: LEFT Performed at [facility], [place] [address] [place], [place] [address]   Culture, body  fluid-bottle     Status: None   Collection Time: 07/07/18 10:10 AM   Specimen: Pleura  Result Value Ref Range Status   Specimen Description PLEURAL LEFT  Final   Special Requests NONE  Final   Culture   Final    NO GROWTH 5 DAYS Performed at [place] [address], [place], [place] [address]    Report Status 07/12/2018 FINAL  Final  Gram stain     Status: None   Collection Time: 07/07/18 10:10 AM   Specimen: Pleura  Result Value Ref Range Status   Specimen Description PLEURAL LEFT  Final   Special Requests NONE  Final   Gram Stain   Final    ABUNDANT WBC PRESENT,BOTH PMN AND MONONUCLEAR NO ORGANISMS SEEN Performed at [facility], [place] [address] [place], [place] [address]    Report Status 07/07/2018 FINAL  Final  Acid Fast Smear (AFB)     Status: None   Collection Time: 07/07/18 10:10 AM   Specimen: Pleural Fluid  Result Value Ref Range Status   AFB Specimen Processing Comment  Final    Comment: Tissue Grinding and Digestion/Decontamination   Acid Fast Smear Negative  Final    Comment: (NOTE) Performed At: [facility] [address] [place], [place] [id number] Rush Farmer MD MI:[id number]    Source (AFB) PLEURAL  Final    Comment: Performed at [facility], [place] [address], [place], [place] [address]  Fungus Culture Result     Status: None   Collection Time: 07/07/18 10:10 AM  Result Value Ref Range Status   Result 1 Comment  Final    Comment: (NOTE) KOH/Calcofluor preparation:  no fungus observed. Performed At: [facility] [place], [place] [id number] Rush Farmer MD XI:[id number]  Expectorated sputum assessment w rflx to resp cult     Status: None   Collection Time: 07/07/18  5:55 PM   Specimen: Expectorated Sputum  Result Value Ref Range Status   Specimen Description EXPECTORATED SPUTUM  Final   Special Requests NONE  Final   Sputum evaluation   Final    THIS SPECIMEN IS ACCEPTABLE FOR SPUTUM  CULTURE Performed at [facility], [address], [place], [place] [address]    Report Status 07/07/2018 FINAL  Final  Culture, respiratory     Status: None   Collection Time: 07/07/18  5:55 PM  Result Value Ref Range Status   Specimen Description EXPECTORATED SPUTUM  Final   Special Requests NONE Reflexed from [id number]  Final   Gram Stain   Final    ABUNDANT WBC PRESENT, PREDOMINANTLY PMN FEW GRAM POSITIVE COCCI    Culture   Final    Consistent with normal respiratory flora. Performed at [facility], [place] [address] [place], [place] [address]    Report Status 07/10/2018 FINAL  Final      Radiology Studies: Dg Chest Port 1 View  Result Date: 07/14/2018 CLINICAL DATA:  Follow-up pleural effusion EXAM: PORTABLE CHEST 1 VIEW COMPARISON:  07/13/2010 FINDINGS: Cardiac shadow is stable. Two chest tubes are again identified on the left. No definitive pneumothorax is identified. Mild haziness is noted at the left base consistent with small residual effusion. Left retrocardiac atelectasis is again noted. Right-sided PICC line is seen in satisfactory position. IMPRESSION: Stable chest tubes on the left. No pneumothorax is noted. Persistent basilar density is noted consistent with atelectasis and small effusion. Electronically Signed   By: Inez Catalina M.D.   On: 07/14/2018 07:06   Dg Chest Port 1 View  Result Date: 07/13/2018 CLINICAL DATA:  Status post left-sided VATS. EXAM: PORTABLE CHEST 1 VIEW COMPARISON:  July 13, 2018 FINDINGS: The patient has been extubated. The right-sided PICC line is well position. Two left-sided chest tubes are in place. There is no large pneumothorax. Subcutaneous gas is noted along the patient's left flank. A dense retrocardiac opacity is noted. There are small bilateral pleural effusions. No right-sided pneumothorax. No acute osseous abnormality. IMPRESSION: 1. Status post extubation.  Remaining lines and tubes as above. 2. Persistent dense retrocardiac  opacity, stable from prior study. 3. Subcutaneous gas along the patient's left flank, slightly increased from prior study. Electronically Signed   By: Constance Holster M.D.   On: 07/13/2018 21:45   Dg Chest Port 1 View  Addendum Date: 07/13/2018   ADDENDUM REPORT: 07/13/2018 18:14 ADDENDUM: The patient's RN reports that the patient has been extubated at this time. Electronically Signed   By: Genevie Ann M.D.   On: 07/13/2018 18:14   Result Date: 07/13/2018 CLINICAL DATA:  48 year old female status post left VATS today for loculated effusion. Postoperative day zero. EXAM: PORTABLE CHEST 1 VIEW COMPARISON:  07/11/2018 and earlier. FINDINGS: Portable AP supine view at 1801 hours. Two left chest tubes are in place. Both tubes course toward the medial left apex. No pneumothorax identified. Endotracheal tube in place with evidence of a left mainstem bronchus intubation. Stable right IJ PICC line. Lower lung volumes. Stable cardiac size and mediastinal contours. Regressed but not fully resolved dense left lung base opacity. Negative visible bowel gas pattern. Stable cholecystectomy clips. IMPRESSION: 1. Left mainstem bronchus intubation suspected. Withdrawal the ET tube 2 cm and repeat a portable chest. 2. Two left chest tubes in place. No pneumothorax  identified. 3. Lower lung volumes.  Mildly improved left lung base ventilation. Electronically Signed: By: Genevie Ann M.D. On: 07/13/2018 18:01    Marzetta Board, MD, PhD [facility]  Contact via  [email or web address]  [place] P: [phone] F: [phone]

## 2018-07-14 NOTE — Progress Notes (Signed)
Patient ID: Michelle Holt, female   DOB: [birth year], 48 y.o.   MRN: [id number] TCTS Evening Rounds:  Hemodynamically stable in sinus rhythm.  sats 96% 2L  Chest tube output low.

## 2018-07-14 NOTE — Progress Notes (Addendum)
TCTS DAILY ICU PROGRESS NOTE                   [place]            [place] [address]          [phone]   1 Day Post-Op Procedure(s) (LRB): VIDEO ASSISTED THORACOSCOPY (Left) DRAINAGE OF LOCULATED PLEURAL EFFUSION (Left) Talc Pleuradesis (Left)  Total Length of Stay:  LOS: 7 days   Subjective:  Up in chair.  Pain well controlled, denies nausea.  Uses percocet at home for pain relief  Objective: Vital signs in last 24 hours: Temp:  [97 F (36.1 C)-98.2 F (36.8 C)] 98 F (36.7 C) (06/30 0756) Pulse Rate:  [69-107] 69 (06/30 0700) Cardiac Rhythm: Normal sinus rhythm (06/30 0730) Resp:  [11-24] 11 (06/30 0700) BP: (98-122)/(44-67) 99/58 (06/30 0700) SpO2:  [86 %-99 %] 96 % (06/30 0700) Arterial Line BP: (103-134)/(60-96) 103/60 (06/30 0700) FiO2 (%):  [0 %] 0 % (06/30 0421) Weight:  [74.7 kg] 74.7 kg (06/30 0600)  Filed Weights   07/06/18 2343 07/10/18 2135 07/14/18 0600  Weight: 76.6 kg 74.8 kg 74.7 kg    Weight change:    Intake/Output from previous day: 06/29 0701 - 06/30 0700 In: 1171.7 [I.V.:1108.2; IV Piggyback:63.5] Out: 2015 [Urine:1845; Blood:50; Chest Tube:120]  Current Meds: Scheduled Meds: . acetaminophen  1,000 mg Oral Q6H   Or  . acetaminophen (TYLENOL) oral liquid 160 mg/5 mL  1,000 mg Oral Q6H  . bisacodyl  10 mg Oral Daily  . enoxaparin (LOVENOX) injection  40 mg Subcutaneous Daily  . fentaNYL   Intravenous Q4H  . insulin aspart  0-24 Units Subcutaneous Q4H  . ketorolac  15 mg Intravenous Q8H  . senna-docusate  1 tablet Oral QHS   Continuous Infusions: . bupivacaine 0.5 % ON-Q pump SINGLE CATH 400 mL    . bupivacaine ON-Q pain pump    . dextrose 5 % and 0.45% NaCl 100 mL/hr at 07/14/18 0600  . vancomycin 200 mL/hr at 07/14/18 0600   PRN Meds:.fentaNYL (SUBLIMAZE) injection, naloxone **AND** sodium chloride flush, ondansetron (ZOFRAN) IV, SUMAtriptan, traMADol  General appearance: alert, cooperative and no distress  Heart: regular rate and rhythm Lungs: diminished breath sounds on left Abdomen: soft, non-tender; bowel sounds normal; no masses,  no organomegaly Extremities: extremities normal, atraumatic, no cyanosis or edema Wound: clean and ddry  Lab Results: CBC: Recent Labs    07/13/18 0441 07/14/18 0424 07/14/18 0438  WBC 9.5 18.0*  --   HGB 11.4* 11.8* 12.2  HCT 35.4* 35.9* 36.0  PLT 333 397  --    BMET:  Recent Labs    07/13/18 0441 07/14/18 0424 07/14/18 0438  NA 137 139 138  K 4.0 4.2 4.3  CL 104 104  --   CO2 26 25  --   GLUCOSE 102* 156*  --   BUN 6 <5*  --   CREATININE 0.48 0.58  --   CALCIUM 8.2* 9.2  --     CMET: Lab Results  Component Value Date   WBC 18.0 (H) 07/14/2018   HGB 12.2 07/14/2018   HCT 36.0 07/14/2018   PLT 397 07/14/2018   GLUCOSE 156 (H) 07/14/2018   ALT 141 (H) 07/12/2018   AST 57 (H) 07/12/2018   NA 138 07/14/2018   K 4.3 07/14/2018   CL 104 07/14/2018   CREATININE 0.58 07/14/2018   BUN <5 (L) 07/14/2018   CO2 25 07/14/2018  TSH 1.492 07/08/2018   INR 1.0 07/13/2018      PT/INR:  Recent Labs    07/13/18 0441  LABPROT 12.9  INR 1.0   Radiology: Dg Chest Port 1 View  Result Date: 07/14/2018 CLINICAL DATA:  Follow-up pleural effusion EXAM: PORTABLE CHEST 1 VIEW COMPARISON:  07/13/2010 FINDINGS: Cardiac shadow is stable. Two chest tubes are again identified on the left. No definitive pneumothorax is identified. Mild haziness is noted at the left base consistent with small residual effusion. Left retrocardiac atelectasis is again noted. Right-sided PICC line is seen in satisfactory position. IMPRESSION: Stable chest tubes on the left. No pneumothorax is noted. Persistent basilar density is noted consistent with atelectasis and small effusion. Electronically Signed   By: Inez Catalina M.D.   On: 07/14/2018 07:06   Dg Chest Port 1 View  Result Date: 07/13/2018 CLINICAL DATA:  Status post left-sided VATS. EXAM: PORTABLE CHEST 1 VIEW  COMPARISON:  July 13, 2018 FINDINGS: The patient has been extubated. The right-sided PICC line is well position. Two left-sided chest tubes are in place. There is no large pneumothorax. Subcutaneous gas is noted along the patient's left flank. A dense retrocardiac opacity is noted. There are small bilateral pleural effusions. No right-sided pneumothorax. No acute osseous abnormality. IMPRESSION: 1. Status post extubation.  Remaining lines and tubes as above. 2. Persistent dense retrocardiac opacity, stable from prior study. 3. Subcutaneous gas along the patient's left flank, slightly increased from prior study. Electronically Signed   By: Constance Holster M.D.   On: 07/13/2018 21:45   Dg Chest Port 1 View  Addendum Date: 07/13/2018   ADDENDUM REPORT: 07/13/2018 18:14 ADDENDUM: The patient's RN reports that the patient has been extubated at this time. Electronically Signed   By: Genevie Ann M.D.   On: 07/13/2018 18:14   Result Date: 07/13/2018 CLINICAL DATA:  48 year old female status post left VATS today for loculated effusion. Postoperative day zero. EXAM: PORTABLE CHEST 1 VIEW COMPARISON:  07/11/2018 and earlier. FINDINGS: Portable AP supine view at 1801 hours. Two left chest tubes are in place. Both tubes course toward the medial left apex. No pneumothorax identified. Endotracheal tube in place with evidence of a left mainstem bronchus intubation. Stable right IJ PICC line. Lower lung volumes. Stable cardiac size and mediastinal contours. Regressed but not fully resolved dense left lung base opacity. Negative visible bowel gas pattern. Stable cholecystectomy clips. IMPRESSION: 1. Left mainstem bronchus intubation suspected. Withdrawal the ET tube 2 cm and repeat a portable chest. 2. Two left chest tubes in place. No pneumothorax identified. 3. Lower lung volumes.  Mildly improved left lung base ventilation. Electronically Signed: By: Genevie Ann M.D. On: 07/13/2018 18:01     Assessment/Plan: S/P Procedure(s)  (LRB): VIDEO ASSISTED THORACOSCOPY (Left) DRAINAGE OF LOCULATED PLEURAL EFFUSION (Left) Talc Pleuradesis (Left)  1. CV- hemodynamically stable, BP remains on the low side 2. Pulm- Chest tube with 120 cc output since surgery, no air leak will place on water seal, likely d/c CT tomorrow and start drainage of Pleurx catheter 3. D/C Arterial line 4. Lovenox for DVT prophylaxis 5. D/C foley per patient request 6. IV fluids to KVO 7. Dispo- patient stable, chest tube to water seal, repeat CXR in AM, H/H orders placed for pleurx, transfer to 2c     Erin Barrett 07/14/2018 8:01 AM   Chest tube to water seal and prob DC tomorrow Ready for tx to 2 C Path on pleural nodules pending patient examined and medical record reviewed,agree  with above note. Tharon Aquas Trigt III 07/14/2018

## 2018-07-14 NOTE — Progress Notes (Signed)
Ms. Drone transferred to [phone] via wheelchair on tele monitoring with all belongings. Pt's VSS and tolerated transfer well. Pt assisted to new bed and received by RN and NT.

## 2018-07-14 NOTE — Op Note (Signed)
NAME: Michelle Holt, Michelle Holt MEDICAL RECORD [id number] ACCOUNT [id number] DATE OF BIRTH:[birth year] FACILITY: [facility] LOCATION: [facility] PHYSICIAN:Dallan Schonberg VAN TRIGT III, MD  OPERATIVE REPORT  DATE OF PROCEDURE:  07/13/2018  OPERATION: 1.  Left VATS (video-assisted thoracoscopic surgery) for drainage of loculated pleural effusion. 2.  Talc pleurodesis for malignant pleural effusion. 3.  Placement of PleurX catheter for management of malignant pleural effusion. 4.  Placement of On-Q analgesia catheter system.  SURGEON:  Ivin Poot, MD  ASSISTANT:  Shaaron Adler PA-C.  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  ANESTHESIA:  General.  DESCRIPTION OF PROCEDURE:  After informed consent and proper site marking were performed in preop holding and all questions regarding the procedure were addressed, the patient was brought to the operating room.  General anesthesia was induced.  She  remained stable.  A double lumen endotracheal technique was used.  The patient was rolled left side up, and the previously placed pigtail catheter was removed.  The left chest was prepped and draped as a sterile field.  A proper time-out was performed.   A small incision was made and the VATS camera was inserted.  The pleural space was obliterated with adhesions and visibility was poor.  A small incision was made in the 4th interspace without spreading the ribs.  The pleural space was entered and a  loculated pleural effusion was drained.  The surface of the pericardium and pleura had some nodular densities and these were excised and sent for pathology.  Five grams of talc was insufflated into the pleural space through a red rubber Robinson  catheter.  Next, the PleurX catheter was placed into the pleural space and brought out through  separate incision and secured.  A chest tube,  28-French was then placed and secured to the skin.  The lung was then reexpanded under direct vision.  The ribs  were reapproximated with a single pericostal suture.  The muscle layer was closed with interrupted layers using Vicryl.  The subcutaneous and skin layers were closed with running Vicryl.  The chest tube was connected to an underwater seal Pleur-Evac  system.  An On-Q catheter was placed in the chest wall muscle between the chest tube and the main incision and secured to the skin and connected to a Marcaine reservoir of 0.5% Marcaine.  The patient was then turned on her back, extubated.   A chest x-ray in the operating room showed the drains in good position and no pneumothorax.  AN/NUANCE  D:07/13/2018 T:07/14/2018 JOB:[id number]

## 2018-07-14 NOTE — Anesthesia Postprocedure Evaluation (Signed)
Anesthesia Post Note  Patient: AMBERA FEDELE  Procedure(s) Performed: VIDEO ASSISTED THORACOSCOPY (Left Chest) DRAINAGE OF LOCULATED PLEURAL EFFUSION (Left ) Talc Pleuradesis (Left Chest)     Patient location during evaluation: PACU Anesthesia Type: General Level of consciousness: awake and alert Pain management: pain level controlled Vital Signs Assessment: post-procedure vital signs reviewed and stable Respiratory status: spontaneous breathing, nonlabored ventilation, respiratory function stable and patient connected to nasal cannula oxygen Cardiovascular status: blood pressure returned to baseline and stable Postop Assessment: no apparent nausea or vomiting Anesthetic complications: no    Last Vitals:  Vitals:   07/14/18 1600 07/14/18 1620  BP: (!) 116/55   Pulse: 91   Resp: 17   Temp:  36.7 C  SpO2: 97%     Last Pain:  Vitals:   07/14/18 1728  TempSrc:   PainSc: [place]

## 2018-07-15 ENCOUNTER — Inpatient Hospital Stay (HOSPITAL_COMMUNITY): Payer: Medicaid Other

## 2018-07-15 LAB — CBC
HCT: 33.7 % — ABNORMAL LOW (ref 36.0–46.0)
Hemoglobin: 10.6 g/dL — ABNORMAL LOW (ref 12.0–15.0)
MCH: 31.9 pg (ref 26.0–34.0)
MCHC: 31.5 g/dL (ref 30.0–36.0)
MCV: 101.5 fL — ABNORMAL HIGH (ref 80.0–100.0)
Platelets: 386 10*3/uL (ref 150–400)
RBC: 3.32 MIL/uL — ABNORMAL LOW (ref 3.87–5.11)
RDW: 12.3 % (ref 11.5–15.5)
WBC: 15.7 10*3/uL — ABNORMAL HIGH (ref 4.0–10.5)
nRBC: 0 % (ref 0.0–0.2)

## 2018-07-15 LAB — COMPREHENSIVE METABOLIC PANEL
ALT: 53 U/L — ABNORMAL HIGH (ref 0–44)
AST: 19 U/L (ref 15–41)
Albumin: 2.2 g/dL — ABNORMAL LOW (ref 3.5–5.0)
Alkaline Phosphatase: 203 U/L — ABNORMAL HIGH (ref 38–126)
Anion gap: 10 (ref 5–15)
BUN: 12 mg/dL (ref 6–20)
CO2: 27 mmol/L (ref 22–32)
Calcium: 8.8 mg/dL — ABNORMAL LOW (ref 8.9–10.3)
Chloride: 101 mmol/L (ref 98–111)
Creatinine, Ser: 0.66 mg/dL (ref 0.44–1.00)
GFR calc Af Amer: >60 mL/min (ref >60)
GFR calc non Af Amer: >60 mL/min (ref >60)
Glucose, Bld: 119 mg/dL — ABNORMAL HIGH (ref 70–99)
Potassium: 4.5 mmol/L (ref 3.5–5.1)
Sodium: 138 mmol/L (ref 135–145)
Total Bilirubin: 0.5 mg/dL (ref 0.3–1.2)
Total Protein: 5.5 g/dL — ABNORMAL LOW (ref 6.5–8.1)

## 2018-07-15 LAB — GLUCOSE, CAPILLARY
Glucose-Capillary: 88 mg/dL (ref 70–99)
Glucose-Capillary: 127 mg/dL — ABNORMAL HIGH (ref 70–99)
Glucose-Capillary: 73 mg/dL (ref 70–99)

## 2018-07-15 MED ORDER — OXYCODONE HCL 5 MG PO TABS
5.0000 mg | ORAL_TABLET | ORAL | Status: DC | PRN
  Administered 2018-07-15 – 2018-07-16 (×3): 10 mg via ORAL
  Administered 2018-07-16: 08:00:00 5 mg via ORAL
  Administered 2018-07-16 (×2): 10 mg via ORAL
  Filled 2018-07-15 (×6): qty 2

## 2018-07-15 MED ORDER — DIAZEPAM 2 MG PO TABS
1.0000 mg | ORAL_TABLET | Freq: Three times a day (TID) | ORAL | Status: DC | PRN
  Administered 2018-07-15 (×2): 1 mg via ORAL
  Filled 2018-07-15 (×2): qty 1

## 2018-07-15 NOTE — Progress Notes (Addendum)
      [address]       [place] [address]             [phone]      2 Days Post-Op Procedure(s) (LRB): VIDEO ASSISTED THORACOSCOPY (Left) DRAINAGE OF LOCULATED PLEURAL EFFUSION (Left) Talc Pleuradesis (Left)   Subjective:  Patient has cough.  She states her chest tube is making a strange sound.  Objective: Vital signs in last 24 hours: Temp:  [97.5 F (36.4 C)-98.1 F (36.7 C)] 97.8 F (36.6 C) (07/01 0300) Pulse Rate:  [72-91] 76 (07/01 0300) Cardiac Rhythm: Normal sinus rhythm (07/01 0400) Resp:  [15-22] 17 (07/01 0406) BP: (92-116)/(52-67) 95/58 (07/01 0300) SpO2:  [95 %-99 %] 98 % (07/01 0406)  Intake/Output from previous day: 06/30 0701 - 07/01 0700 In: 883.7 [P.O.:595; I.V.:88.7; IV Piggyback:200] Out: 760 [Urine:750; Chest Tube:10]  General appearance: alert, cooperative and no distress Heart: regular rate and rhythm Lungs: diminished breath sounds left base Abdomen: soft, non-tender; bowel sounds normal; no masses,  no organomegaly Extremities: extremities normal, atraumatic, no cyanosis or edema Wound: clean and dry  Lab Results: Recent Labs    07/14/18 0424 07/14/18 0438 07/15/18 0424  WBC 18.0*  --  15.7*  HGB 11.8* 12.2 10.6*  HCT 35.9* 36.0 33.7*  PLT 397  --  386   BMET:  Recent Labs    07/14/18 0424 07/14/18 0438 07/15/18 0424  NA 139 138 138  K 4.2 4.3 4.5  CL 104  --  101  CO2 25  --  27  GLUCOSE 156*  --  119*  BUN <5*  --  12  CREATININE 0.58  --  0.66  CALCIUM 9.2  --  8.8*    PT/INR:  Recent Labs    07/13/18 0441  LABPROT 12.9  INR 1.0   ABG    Component Value Date/Time   PHART 7.400 07/14/2018 0438   HCO3 26.9 07/14/2018 0438   TCO2 28 07/14/2018 0438   O2SAT 94.0 07/14/2018 0438   CBG (last 3)  Recent Labs    07/14/18 1617 07/14/18 2113 07/15/18 0620  GLUCAP 118* 124* 88    Assessment/Plan: S/P Procedure(s) (LRB): VIDEO ASSISTED THORACOSCOPY (Left) DRAINAGE OF LOCULATED PLEURAL  EFFUSION (Left) Talc Pleuradesis (Left)  1. Chest tube- no air leak, there is chatter from chest tube with cough- will d/c chest tube today, repeat CXR in AM 2. Pulm- left sided pleural effusion, Pleur-x catheter in place will drain as needed 3. D/C PCA 4. Pathology remains pending 5. Dispo- patient stable, d/c chest tube today, will start draining pleurx in AM.. if paitent remains clinically stable, she can possibly be d/c tomorrow... patient is requesting referral to [facility] for Oncology   LOS: 8 days    Ellwood Handler 07/15/2018  Path shows no pleural tumor- solid tumor probably intra-peritoneal Will need HHN to assist with Pleurx care Leave sutures in and remove in office in 10 days with cxr patient examined and medical record reviewed,agree with above note. Tharon Aquas Trigt III 07/15/2018

## 2018-07-15 NOTE — Progress Notes (Signed)
PROGRESS NOTE    Michelle Holt  GQB:[id number] DOB: 09-Jul-1970 DOA: 07/06/2018 PCP: [name], MD    Brief Narrative:  48 year old female with history of depression, anxiety, IBS, headaches, tobacco use was admitted to the hospital with shortness of breath.  Work-up revealed left-sided pleural effusion with compressive atelectasis and mediastinal shift.  She underwent thoracentesis by IR and pigtail catheter placement.  Cardiothoracic surgery following and she is status post VATS on 6/29  Assessment & Plan:   Principal Problem:   Pleural effusion on left Active Problems:   Generalized anxiety disorder   Migraine   Acute respiratory failure with hypoxia (HCC)   Tobacco abuse   Pleural effusion  Acute hypoxic respiratory failure due to large left pleural effusion, concern for malignant effusion -CT chest on 6/25 showed a loculated left hydropneumothorax -Fluid pathology shows malignant cells concerning for adenocarcinoma, possibly GYN source.  Ca 125 elevated.  Surgical pathology was sent from VATS, remains pending -Chest tube removed per thoracic surgery with repeat CXR in the AM. Possibility for d/c home in 24hrs per Surgery -Have discussed case with Oncology who will also follow up on final pathology and refer pt to appropriate oncology specialty -She was initially on broad-spectrum antibiotics but now narrowed to cefdinir for 5 days  Generalized anxiety disorder -Continue Valium as needed -Appears anxious  Migraine headaches -PRN Imitrex as tolerated  Tobacco abuse -Cessation to be done at bedside  DVT prophylaxis: Lovenox subQ Code Status: Full Family Communication: Pt in room, family not at bedside Disposition Plan: Possible d/c in 24hrs  Consultants:   CT surgery  Discussed case with Oncology  Procedures:   Pigtail cath placement   Antimicrobials: Anti-infectives (From admission, onward)   Start     Dose/Rate Route Frequency Ordered Stop   07/14/18 0400  vancomycin (VANCOCIN) IVPB 1000 mg/200 mL premix     1,000 mg 200 mL/hr over 60 Minutes Intravenous Every 12 hours 07/13/18 2037 07/14/18 1743   07/13/18 1300  vancomycin (VANCOCIN) IVPB 1000 mg/200 mL premix     1,000 mg 200 mL/hr over 60 Minutes Intravenous To Surgery 07/12/18 0842 07/13/18 1700   07/10/18 1000  cefdinir (OMNICEF) capsule 300 mg  Status:  Discontinued     300 mg Oral Every 12 hours 07/10/18 0805 07/13/18 2037   07/07/18 1400  ceFEPIme (MAXIPIME) 2 g in sodium chloride 0.9 % 100 mL IVPB  Status:  Discontinued     2 g 200 mL/hr over 30 Minutes Intravenous 2 times daily 07/07/18 1104 07/10/18 0805   07/06/18 2230  aztreonam (AZACTAM) 1 g in sodium chloride 0.9 % 100 mL IVPB  Status:  Discontinued     1 g 200 mL/hr over 30 Minutes Intravenous Every 8 hours 07/06/18 2215 07/07/18 1058   07/06/18 2230  vancomycin (VANCOCIN) IVPB 1000 mg/200 mL premix  Status:  Discontinued     1,000 mg 200 mL/hr over 60 Minutes Intravenous Every 12 hours 07/06/18 2223 07/10/18 0805       Subjective: Anxious about likely cancer diagnosis  Objective: Vitals:   07/15/18 0300 07/15/18 0406 07/15/18 0823 07/15/18 1029  BP: (!) 95/58  (!) 94/54 (!) 102/43  Pulse: 76  81 84  Resp: 15 17 19 18   Temp: 97.8 F (36.6 C)  98.7 F (37.1 C) 97.7 F (36.5 C)  TempSrc: Oral  Oral Oral  SpO2: 98% 98% 100% 92%  Weight:      Height:        Intake/Output  Summary (Last 24 hours) at 07/15/2018 1435 Last data filed at 07/15/2018 0824 Gross per 24 hour  Intake 1113.68 ml  Output -  Net 1113.68 ml   Filed Weights   07/06/18 2343 07/10/18 2135 07/14/18 0600  Weight: 76.6 kg 74.8 kg 74.7 kg    Examination:  General exam: Appears calm and comfortable  Respiratory system: Clear to auscultation. Respiratory effort normal. Cardiovascular system: S1 & S2 heard, RRR Gastrointestinal system: Abdomen is nondistended, soft and nontender. No organomegaly or masses felt. Normal bowel sounds  heard. Central nervous system: Alert and oriented. No focal neurological deficits. Extremities: Symmetric 5 x 5 power. Skin: No rashes, lesions Psychiatry: Judgement and insight appear normal. Appears anxious  Data Reviewed: I have personally reviewed following labs and imaging studies  CBC: Recent Labs  Lab 07/10/18 0445 07/12/18 0709 07/12/18 1219 07/13/18 0441 07/14/18 0424 07/14/18 0438 07/15/18 0424  WBC 11.3* 10.2 11.5* 9.5 18.0*  --  15.7*  NEUTROABS 7.8*  --   --   --   --   --   --   HGB 13.3 12.2 12.8 11.4* 11.8* 12.2 10.6*  HCT 40.5 37.2 38.4 35.4* 35.9* 36.0 33.7*  MCV 98.3 99.2 98.5 100.3* 98.6  --  101.5*  PLT 364 355 347 333 397  --  161   Basic Metabolic Panel: Recent Labs  Lab 07/10/18 0445 07/12/18 0709 07/12/18 1219 07/13/18 0441 07/14/18 0424 07/14/18 0438 07/15/18 0424  NA 138 138 137 137 139 138 138  K 3.9 4.0 3.7 4.0 4.2 4.3 4.5  CL 103 100 101 104 104  --  101  CO2 26 28 26 26 25   --  27  GLUCOSE 102* 104* 147* 102* 156*  --  119*  BUN 5* <5* <5* 6 <5*  --  12  CREATININE 0.55 0.57 0.59 0.48 0.58  --  0.66  CALCIUM 8.5* 8.5* 8.6* 8.2* 9.2  --  8.8*  MG 1.9  --   --   --   --   --   --   PHOS 3.5  --   --   --   --   --   --    GFR: Estimated Creatinine Clearance: 88.9 mL/min (by C-G formula based on SCr of 0.66 mg/dL). Liver Function Tests: Recent Labs  Lab 07/12/18 1219 07/15/18 0424  AST 57* 19  ALT 141* 53*  ALKPHOS 322* 203*  BILITOT 0.6 0.5  PROT 5.8* 5.5*  ALBUMIN 2.2* 2.2*   No results for input(s): LIPASE, AMYLASE in the last 168 hours. No results for input(s): AMMONIA in the last 168 hours. Coagulation Profile: Recent Labs  Lab 07/12/18 1219 07/13/18 0441  INR 1.1 1.0   Cardiac Enzymes: No results for input(s): CKTOTAL, CKMB, CKMBINDEX, TROPONINI in the last 168 hours. BNP (last 3 results) No results for input(s): PROBNP in the last 8760 hours. HbA1C: No results for input(s): HGBA1C in the last 72 hours. CBG:  Recent Labs  Lab 07/14/18 0746 07/14/18 1203 07/14/18 1617 07/14/18 2113 07/15/18 0620  GLUCAP 141* 144* 118* 124* 88   Lipid Profile: No results for input(s): CHOL, HDL, LDLCALC, TRIG, CHOLHDL, LDLDIRECT in the last 72 hours. Thyroid Function Tests: No results for input(s): TSH, T4TOTAL, FREET4, T3FREE, THYROIDAB in the last 72 hours. Anemia Panel: No results for input(s): VITAMINB12, FOLATE, FERRITIN, TIBC, IRON, RETICCTPCT in the last 72 hours. Sepsis Labs: Recent Labs  Lab 07/08/18 1908  PROCALCITON <0.10    Recent Results (from the past  240 hour(s))  SARS Coronavirus 2 (CEPHEID- Performed in [facility] hospital lab), Hosp Order     Status: None   Collection Time: 07/06/18  8:37 PM   Specimen: Nasopharyngeal Swab  Result Value Ref Range Status   SARS Coronavirus 2 NEGATIVE NEGATIVE Final    Comment: (NOTE) If result is NEGATIVE SARS-CoV-2 target nucleic acids are NOT DETECTED. The SARS-CoV-2 RNA is generally detectable in upper and lower  respiratory specimens during the acute phase of infection. The lowest  concentration of SARS-CoV-2 viral copies this assay can detect is 250  copies / mL. A negative result does not preclude SARS-CoV-2 infection  and should not be used as the sole basis for treatment or other  patient management decisions.  A negative result may occur with  improper specimen collection / handling, submission of specimen other  than nasopharyngeal swab, presence of viral mutation(s) within the  areas targeted by this assay, and inadequate number of viral copies  (<250 copies / mL). A negative result must be combined with clinical  observations, patient history, and epidemiological information. If result is POSITIVE SARS-CoV-2 target nucleic acids are DETECTED. The SARS-CoV-2 RNA is generally detectable in upper and lower  respiratory specimens dur ing the acute phase of infection.  Positive  results are indicative of active infection with  SARS-CoV-2.  Clinical  correlation with patient history and other diagnostic information is  necessary to determine patient infection status.  Positive results do  not rule out bacterial infection or co-infection with other viruses. If result is PRESUMPTIVE POSTIVE SARS-CoV-2 nucleic acids MAY BE PRESENT.   A presumptive positive result was obtained on the submitted specimen  and confirmed on repeat testing.  While 2019 novel coronavirus  (SARS-CoV-2) nucleic acids may be present in the submitted sample  additional confirmatory testing may be necessary for epidemiological  and / or clinical management purposes  to differentiate between  SARS-CoV-2 and other Sarbecovirus currently known to infect humans.  If clinically indicated additional testing with an alternate test  methodology [phone]) is advised. The SARS-CoV-2 RNA is generally  detectable in upper and lower respiratory sp ecimens during the acute  phase of infection. The expected result is Negative. Fact Sheet for Patients:  StrictlyIdeas.no Fact Sheet for Healthcare Providers: BankingDealers.co.za This test is not yet approved or cleared by the [place] FDA and has been authorized for detection and/or diagnosis of SARS-CoV-2 by FDA under an Emergency Use Authorization (EUA).  This EUA will remain in effect (meaning this test can be used) for the duration of the COVID-19 declaration under Section 564(b)(1) of the Act, 21 U.S.C. section 360bbb-3(b)(1), unless the authorization is terminated or revoked sooner. Performed at [facility], [place] [address], [place], [place] [address]   Culture, blood (Routine X 2) w Reflex to ID Panel     Status: None   Collection Time: 07/07/18 12:05 AM   Specimen: BLOOD  Result Value Ref Range Status   Specimen Description BLOOD RIGHT ARM  Final   Special Requests   Final    BOTTLES DRAWN AEROBIC ONLY Blood Culture adequate volume    Culture   Final    NO GROWTH 5 DAYS Performed at [facility], [place] [address] [place], [place] [address]    Report Status 07/12/2018 FINAL  Final  Culture, blood (Routine X 2) w Reflex to ID Panel     Status: None   Collection Time: 07/07/18 12:10 AM   Specimen: BLOOD  Result  Value Ref Range Status   Specimen Description BLOOD RIGHT ARM  Final   Special Requests   Final    BOTTLES DRAWN AEROBIC ONLY Blood Culture adequate volume   Culture   Final    NO GROWTH 5 DAYS Performed at [facility], [address], [place], [place] [address]    Report Status 07/12/2018 FINAL  Final  MRSA PCR Screening     Status: None   Collection Time: 07/07/18  1:42 AM   Specimen: Nasal Mucosa; Nasopharyngeal  Result Value Ref Range Status   MRSA by PCR NEGATIVE NEGATIVE Final    Comment:        The GeneXpert MRSA Assay (FDA approved for NASAL specimens only), is one component of a comprehensive MRSA colonization surveillance program. It is not intended to diagnose MRSA infection nor to guide or monitor treatment for MRSA infections. Performed at [facility], [place] [address], [place], [place] [address]   Fungus Culture With Stain     Status: None (Preliminary result)   Collection Time: 07/07/18 10:10 AM   Specimen: Nasal Mucosa; Pleural Fluid  Result Value Ref Range Status   Fungus Stain Final report  Final    Comment: (NOTE) Performed At: [facility] [place], [place] [id number] Rush Farmer MD ZP:[id number]    Fungus (Mycology) Culture PENDING  Incomplete   Fungal Source PLEURAL  Final    Comment: LEFT Performed at [facility], [place] [address], [place], [place] [address]   Culture, body fluid-bottle     Status: None   Collection Time: 07/07/18 10:10 AM   Specimen: Pleura  Result Value Ref Range Status   Specimen Description PLEURAL LEFT  Final   Special Requests NONE  Final   Culture   Final    NO GROWTH 5 DAYS Performed at [place] [address], [place], [place] [address]    Report Status 07/12/2018 FINAL  Final  Gram stain     Status: None   Collection Time: 07/07/18 10:10 AM   Specimen: Pleura  Result Value Ref Range Status   Specimen Description PLEURAL LEFT  Final   Special Requests NONE  Final   Gram Stain   Final    ABUNDANT WBC PRESENT,BOTH PMN AND MONONUCLEAR NO ORGANISMS SEEN Performed at [facility], [place] [address], [place], [place] [address]    Report Status 07/07/2018 FINAL  Final  Acid Fast Smear (AFB)     Status: None   Collection Time: 07/07/18 10:10 AM   Specimen: Nasal Mucosa; Pleural Fluid  Result Value Ref Range Status   AFB Specimen Processing Comment  Final    Comment: Tissue Grinding and Digestion/Decontamination   Acid Fast Smear Negative  Final    Comment: (NOTE) Performed At: [facility] [address] [place], [place] [id number] Rush Farmer MD EO:[id number]    Source (AFB) PLEURAL  Final    Comment: Performed at [facility], [place] [address] [place], [place] [address]  Fungus Culture Result     Status: None   Collection Time: 07/07/18 10:10 AM  Result Value Ref Range Status   Result 1 Comment  Final    Comment: (NOTE) KOH/Calcofluor preparation:  no fungus observed. Performed At: [facility] [place], [place] [id number] Rush Farmer MD [id number]   Expectorated sputum assessment w rflx to resp cult     Status: None   Collection Time: 07/07/18  5:55 PM   Specimen: Expectorated Sputum  Result Value Ref Range Status   Specimen Description EXPECTORATED SPUTUM  Final   Special Requests NONE  Final   Sputum evaluation   Final    THIS SPECIMEN IS ACCEPTABLE FOR SPUTUM CULTURE Performed at [facility], [address], [place], [place] [address]    Report Status 07/07/2018 FINAL  Final  Culture, respiratory     Status: None   Collection Time: 07/07/18  5:55 PM  Result Value Ref Range Status    Specimen Description EXPECTORATED SPUTUM  Final   Special Requests NONE Reflexed from [id number]  Final   Gram Stain   Final    ABUNDANT WBC PRESENT, PREDOMINANTLY PMN FEW GRAM POSITIVE COCCI    Culture   Final    Consistent with normal respiratory flora. Performed at [facility], [place] [address], [place], [place] [address]    Report Status 07/10/2018 FINAL  Final     Radiology Studies: Dg Chest Port 1 View  Result Date: 07/15/2018 CLINICAL DATA:  Chest 2 EXAM: PORTABLE CHEST 1 VIEW COMPARISON:  Portable exam 1448 hours compared to 07/14/2018 FINDINGS: Pair of LEFT thoracostomy tubes are again identified. RIGHT arm PICC line tip projects over SVC. Upper normal heart size. Mediastinal contours and pulmonary vascularity normal. Minimal pleural effusion at RIGHT base. Tiny LEFT apex pneumothorax. Decreased atelectasis LEFT lower lobe. Remaining lungs clear. IMPRESSION: Minimal LEFT pleural effusion and tiny LEFT pneumothorax despite thoracostomy tubes. Decreased atelectasis LEFT lower lobe. Electronically Signed   By: Lavonia Dana M.D.   On: 07/15/2018 08:50   Dg Chest Port 1 View  Result Date: 07/14/2018 CLINICAL DATA:  Follow-up pleural effusion EXAM: PORTABLE CHEST 1 VIEW COMPARISON:  07/13/2010 FINDINGS: Cardiac shadow is stable. Two chest tubes are again identified on the left. No definitive pneumothorax is identified. Mild haziness is noted at the left base consistent with small residual effusion. Left retrocardiac atelectasis is again noted. Right-sided PICC line is seen in satisfactory position. IMPRESSION: Stable chest tubes on the left. No pneumothorax is noted. Persistent basilar density is noted consistent with atelectasis and small effusion. Electronically Signed   By: Inez Catalina M.D.   On: 07/14/2018 07:06   Dg Chest Port 1 View  Result Date: 07/13/2018 CLINICAL DATA:  Status post left-sided VATS. EXAM: PORTABLE CHEST 1 VIEW COMPARISON:  July 13, 2018 FINDINGS: The patient has  been extubated. The right-sided PICC line is well position. Two left-sided chest tubes are in place. There is no large pneumothorax. Subcutaneous gas is noted along the patient's left flank. A dense retrocardiac opacity is noted. There are small bilateral pleural effusions. No right-sided pneumothorax. No acute osseous abnormality. IMPRESSION: 1. Status post extubation.  Remaining lines and tubes as above. 2. Persistent dense retrocardiac opacity, stable from prior study. 3. Subcutaneous gas along the patient's left flank, slightly increased from prior study. Electronically Signed   By: Constance Holster M.D.   On: 07/13/2018 21:45   Dg Chest Port 1 View  Addendum Date: 07/13/2018   ADDENDUM REPORT: 07/13/2018 18:14 ADDENDUM: The patient's RN reports that the patient has been extubated at this time. Electronically Signed   By: Genevie Ann M.D.   On: 07/13/2018 18:14   Result Date: 07/13/2018 CLINICAL DATA:  48 year old female status post left VATS today for loculated effusion. Postoperative day zero. EXAM: PORTABLE CHEST 1 VIEW COMPARISON:  07/11/2018 and earlier. FINDINGS: Portable AP supine view at 1801 hours. Two left chest tubes  are in place. Both tubes course toward the medial left apex. No pneumothorax identified. Endotracheal tube in place with evidence of a left mainstem bronchus intubation. Stable right IJ PICC line. Lower lung volumes. Stable cardiac size and mediastinal contours. Regressed but not fully resolved dense left lung base opacity. Negative visible bowel gas pattern. Stable cholecystectomy clips. IMPRESSION: 1. Left mainstem bronchus intubation suspected. Withdrawal the ET tube 2 cm and repeat a portable chest. 2. Two left chest tubes in place. No pneumothorax identified. 3. Lower lung volumes.  Mildly improved left lung base ventilation. Electronically Signed: By: Genevie Ann M.D. On: 07/13/2018 18:01    Scheduled Meds: . bisacodyl  10 mg Oral Daily  . enoxaparin (LOVENOX) injection  40 mg  Subcutaneous Daily  . insulin aspart  0-24 Units Subcutaneous TID AC & HS  . senna-docusate  1 tablet Oral QHS   Continuous Infusions: . bupivacaine 0.5 % ON-Q pump SINGLE CATH 400 mL    . bupivacaine ON-Q pain pump       LOS: 8 days   Marylu Lund, MD [facility] Pager On Amion  If 7PM-7AM, please contact night-coverage 07/15/2018, 2:35 PM

## 2018-07-15 NOTE — Progress Notes (Signed)
Patient is quite tearful this morning.  States everything is hitting her.  She anticipates biopsy results possibly today.  States she is worried about telling her children about her diagnosis/prognosis.  Patient asks for her PRN Valium.  Order discontinued 06/29.  Dr. Wyline Copas made aware of patient request.  Await orders.

## 2018-07-15 NOTE — Progress Notes (Signed)
Left lateral CT discontinued per protocol/order.  Patient tolerated well.  Site C/D/I w/gauze and medipore tape for security.  Patient pre-medicated w/PRN IVP Fentanyl.  Will continue to monitor closely.  Resps easy. Sats 98% on RA.

## 2018-07-16 ENCOUNTER — Inpatient Hospital Stay (HOSPITAL_COMMUNITY): Payer: Medicaid Other

## 2018-07-16 ENCOUNTER — Telehealth: Payer: Self-pay

## 2018-07-16 LAB — CBC
HCT: 31.8 % — ABNORMAL LOW (ref 36.0–46.0)
Hemoglobin: 10.1 g/dL — ABNORMAL LOW (ref 12.0–15.0)
MCH: 32.3 pg (ref 26.0–34.0)
MCHC: 31.8 g/dL (ref 30.0–36.0)
MCV: 101.6 fL — ABNORMAL HIGH (ref 80.0–100.0)
Platelets: 377 10*3/uL (ref 150–400)
RBC: 3.13 MIL/uL — ABNORMAL LOW (ref 3.87–5.11)
RDW: 12.6 % (ref 11.5–15.5)
WBC: 11.3 10*3/uL — ABNORMAL HIGH (ref 4.0–10.5)
nRBC: 0 % (ref 0.0–0.2)

## 2018-07-16 LAB — BASIC METABOLIC PANEL
Anion gap: 9 (ref 5–15)
BUN: 8 mg/dL (ref 6–20)
CO2: 28 mmol/L (ref 22–32)
Calcium: 8.6 mg/dL — ABNORMAL LOW (ref 8.9–10.3)
Chloride: 100 mmol/L (ref 98–111)
Creatinine, Ser: 0.69 mg/dL (ref 0.44–1.00)
GFR calc Af Amer: >60 mL/min (ref >60)
GFR calc non Af Amer: >60 mL/min (ref >60)
Glucose, Bld: 90 mg/dL (ref 70–99)
Potassium: 4.1 mmol/L (ref 3.5–5.1)
Sodium: 137 mmol/L (ref 135–145)

## 2018-07-16 LAB — GLUCOSE, CAPILLARY
Glucose-Capillary: 68 mg/dL — ABNORMAL LOW (ref 70–99)
Glucose-Capillary: 67 mg/dL — ABNORMAL LOW (ref 70–99)

## 2018-07-16 MED ORDER — PROMETHAZINE HCL 25 MG PO TABS: 25.0000 mg | ORAL_TABLET | Freq: Four times a day (QID) | ORAL | 0 refills | Status: DC | PRN

## 2018-07-16 MED ORDER — BENZONATATE 100 MG PO CAPS: 100.0000 mg | ORAL_CAPSULE | Freq: Three times a day (TID) | ORAL | 0 refills | Status: DC | PRN

## 2018-07-16 MED ORDER — PROMETHAZINE HCL 25 MG PO TABS
25.0000 mg | ORAL_TABLET | Freq: Four times a day (QID) | ORAL | Status: DC | PRN
  Administered 2018-07-16: 16:00:00 25 mg via ORAL
  Filled 2018-07-16: qty 1

## 2018-07-16 MED ORDER — SENNOSIDES-DOCUSATE SODIUM 8.6-50 MG PO TABS: 1.0000 | ORAL_TABLET | Freq: Every day | ORAL | 0 refills | Status: AC

## 2018-07-16 MED ORDER — DOCUSATE SODIUM 100 MG PO CAPS: 100.0000 mg | ORAL_CAPSULE | Freq: Two times a day (BID) | ORAL | 2 refills | Status: AC

## 2018-07-16 MED ORDER — OXYCODONE HCL 5 MG PO TABS: 5.0000 mg | ORAL_TABLET | ORAL | 0 refills | Status: DC | PRN

## 2018-07-16 MED FILL — PROMETHAZINE 25 MG TABLET: 25 | 8 days supply | Qty: 30 | Fill #0

## 2018-07-16 MED FILL — DOK 100 MG CAPS: 100 | 30 days supply | Qty: 60 | Fill #0

## 2018-07-16 MED FILL — BENZONATATE 100 MG CAP: 100 | 10 days supply | Qty: 30 | Fill #0

## 2018-07-16 MED FILL — SENNA S 8.6-50 MG TABS: 8.6-50 | 30 days supply | Qty: 30 | Fill #0

## 2018-07-16 MED FILL — oxyCODONE HCL 5 MG TABS: 5 | 3 days supply | Qty: 20 | Fill #0

## 2018-07-16 NOTE — Progress Notes (Signed)
      [address]       [place] [address]             [phone]      3 Days Post-Op Procedure(s) (LRB): VIDEO ASSISTED THORACOSCOPY (Left) DRAINAGE OF LOCULATED PLEURAL EFFUSION (Left) Talc Pleuradesis (Left)   Subjective:  No new complaints.  Continues to have pain along left side.  Hoping to get answers soon regarding condition.  Objective: Vital signs in last 24 hours: Temp:  [97.6 F (36.4 C)-98.7 F (37.1 C)] 98.6 F (37 C) (07/02 0409) Pulse Rate:  [81-89] 86 (07/01 2319) Cardiac Rhythm: Normal sinus rhythm (07/02 0409) Resp:  [15-24] 15 (07/02 0409) BP: (94-107)/(43-59) 99/52 (07/02 0409) SpO2:  [92 %-100 %] 92 % (07/02 0409)  Intake/Output from previous day: 07/01 0701 - 07/02 0700 In: 840 [P.O.:840] Out: 1 [Stool:1]  General appearance: alert, cooperative and no distress Heart: regular rate and rhythm Lungs: diminished breath sounds left base Abdomen: soft, non-tender; bowel sounds normal; no masses,  no organomegaly Extremities: extremities normal, atraumatic, no cyanosis or edema Wound: clean and dry  Lab Results: Recent Labs    07/15/18 0424 07/16/18 0425  WBC 15.7* 11.3*  HGB 10.6* 10.1*  HCT 33.7* 31.8*  PLT 386 377   BMET:  Recent Labs    07/15/18 0424 07/16/18 0425  NA 138 137  K 4.5 4.1  CL 101 100  CO2 27 28  GLUCOSE 119* 90  BUN 12 8  CREATININE 0.66 0.69  CALCIUM 8.8* 8.6*    PT/INR: No results for input(s): LABPROT, INR in the last 72 hours. ABG    Component Value Date/Time   PHART 7.400 07/14/2018 0438   HCO3 26.9 07/14/2018 0438   TCO2 28 07/14/2018 0438   O2SAT 94.0 07/14/2018 0438   CBG (last 3)  Recent Labs    07/15/18 1718 07/15/18 2118 07/16/18 0630  GLUCAP 127* 73 68*    Assessment/Plan: S/P Procedure(s) (LRB): VIDEO ASSISTED THORACOSCOPY (Left) DRAINAGE OF LOCULATED PLEURAL EFFUSION (Left) Talc Pleuradesis (Left)  1. CV- hemodynamically stable 2. Pulm- CXR without pneumothorax  post chest tube removal... I personally drained Pleur-x with removal of about 5 cc of blood tinged fluid 3. Dispo- patient okay to d/c home from our standpoint, follow up appointment has been made, instructed patient on starting Pleurx catheter drainage again on Monday   LOS: 9 days    Ellwood Handler 07/16/2018

## 2018-07-16 NOTE — TOC Initial Note (Addendum)
Transition of Care [facility]) - Initial/Assessment Note    Patient Details  Name: Michelle Holt MRN: [id number] Date of Birth: Dec 14, 1970  [facility] CM/SW Contact:    Maryclare Labrador, RN Phone Number: 07/16/2018, 9:42 AM  Clinical Narrative:     PTA independent from home, recently lost husband to [place].  Pt confirms she doesn't have current insurance but she does however have a PCP that she wants to remain with.  Pt will need HHRN for plurex at discharge.  CM contacted agency providing charity this week [facility] and provided referral.  [facility] does not provide plurex services but will locate an agency that will - [facility] to follow back up with CM             Update:  [facility] informed CM that no [facility] agency will accept pt.  CM reached out to [facility] for solution regarding [place] as pt is unable to get to [facility] office on regular basis for drain care and drainage.  CVTS to provide plurex packet to ensure pt continues to get supplies post discharge.  CM will continue to follow   [facility] will accept pt with LOG for 3 visits only starting Monday 7/6 - LOG completed and sent to [place].  Pt will be sent home with 19 drainage catheters (bedside nurse to ensure) - CSW completed Pleurx form and faxed out to [facility].  Attending made aware that pt will only receive 3 visits by RN next week/will discharge home with on 3 RN visits- attending group to reach out to pt and will handle drainage care from that point forward.  Pt denied needing any other assistance Expected Discharge Plan: [place]     Patient Goals and CMS Choice Patient states their goals for this hospitalization and ongoing recovery are:: Pt states she is ready to get home to deal with this new dx, pt is eager to see oncologist      Expected Discharge Plan and Services Expected Discharge Plan: [place]                                              Prior Living  Arrangements/Services   Lives with:: Self                   Activities of Daily Living Home Assistive Devices/Equipment: None ADL Screening (condition at time of admission) Patient's cognitive ability adequate to safely complete daily activities?: Yes Is the patient deaf or have difficulty hearing?: No Does the patient have difficulty seeing, even when wearing glasses/contacts?: No Does the patient have difficulty concentrating, remembering, or making decisions?: No Patient able to express need for assistance with ADLs?: Yes Does the patient have difficulty dressing or bathing?: No Independently performs ADLs?: Yes (appropriate for developmental age) Does the patient have difficulty walking or climbing stairs?: No Weakness of Legs: None Weakness of Arms/Hands: None  Permission Sought/Granted                  Emotional Assessment              Admission diagnosis:  Pleural effusion [J90] Patient Active Problem List   Diagnosis Date Noted  . Pleural effusion 07/07/2018  . Migraine 07/06/2018  . Pleural effusion on left 07/06/2018  . Acute respiratory failure with hypoxia (  [place]) 07/06/2018  . Tobacco abuse 07/06/2018  . Generalized anxiety disorder 05/02/2014  . Unspecified constipation 08/10/2013  . Rectal bleeding 08/10/2013  . Lumbosacral spondylosis without myelopathy 10/23/2011  . Intractable migraine without aura 10/23/2011   PCP:  [name], MD Pharmacy:   [place], [place] - [address] [place] [address] Phone: [phone] Fax: [phone]     Social Determinants of Health (SDOH) Interventions    Readmission Risk Interventions No flowsheet data found.

## 2018-07-16 NOTE — Progress Notes (Signed)
Discharge instructions reviewed and patient questions were answered.  Assited with reinforcing left sided chest dressing.  Assisted with getting dressed and via wheelchair to her daughter's waiting care

## 2018-07-16 NOTE — Telephone Encounter (Signed)
-----   Message from Heath Lark, MD sent at 07/16/2018 10:21 AM EDT ----- Regarding: new GYN patient I can see her Monday at 1145 am, 45 mins

## 2018-07-16 NOTE — Telephone Encounter (Signed)
Called and given below message. She verbalized understanding. She is still in the hospital appt details given to nurse.

## 2018-07-16 NOTE — Progress Notes (Signed)
Chart reviewed; B Macklyn Glandon RN,MHA,BSN  Advanced Care Supervisor [phone] 

## 2018-07-16 NOTE — Discharge Summary (Signed)
Physician Discharge Summary  Michelle Holt RCV:[id number] DOB: 1970-02-03 DOA: 07/06/2018  PCP: [name], MD  Admit date: 07/06/2018 Discharge date: 07/16/2018  Admitted From: Home Disposition:  Home  Recommendations for Outpatient Follow-up:  1. Follow up with PCP in 2-3 weeks 2. Follow up with Oncology as scheduled 3. Follow up with CT Surgery as scheduled  Discharge Condition:Stable CODE STATUS:Full Diet recommendation: Regular   Brief/Interim Summary: 48 year old female with history of depression, anxiety, IBS, headaches, tobacco use was admitted to the hospital with shortness of breath. Work-up revealed left-sided pleural effusion with compressive atelectasis and mediastinal shift. She underwent thoracentesis by IR and pigtail catheter placement. Cardiothoracic surgery following and she is status post VATS on 6/29   Discharge Diagnoses:  Principal Problem:   Pleural effusion on left Active Problems:   Generalized anxiety disorder   Migraine   Acute respiratory failure with hypoxia (HCC)   Tobacco abuse   Pleural effusion  Acute hypoxic respiratory failure due to large left pleural effusion, concern for malignant effusion -CT chest on 6/25 showed a loculated left hydropneumothorax -Fluid pathology shows malignant cells concerning for adenocarcinoma, possibly GYN source. Ca 125 elevated. Surgical pathology was sent from VATS, reviewed, and is neg for malignancy -Chest tube removed per thoracic surgery -Have discussed case with Oncology who will arrange close follow up -She was initially on broad-spectrum antibiotics but now narrowed to cefdinir for 5 days  Generalized anxiety disorder -Continue with Valium as needed -stable  Migraine headaches -PRN Imitrex continued as tolerated  Tobacco abuse -recommend cessation   Discharge Instructions   Allergies as of 07/16/2018      Reactions   Morphine And Related Itching   Nortriptyline Other (See  Comments)   Significant weight gain   Topamax [topiramate] Diarrhea   nausea   Actifed Cold-allergy [chlorpheniramine-phenyleph Er] Rash   Amoxicillin Rash   Did it involve swelling of the face/tongue/throat, SOB, or low BP? Unknown Did it involve sudden or severe rash/hives, skin peeling, or any reaction on the inside of your mouth or nose? Unknown Did you need to seek medical attention at a hospital or doctor's office? Unknown When did it last happen? teenager If all above answers are "NO", may proceed with cephalosporin use.   Codeine Hives   Erythromycin Rash   Penicillins Rash   Did it involve swelling of the face/tongue/throat, SOB, or low BP? Unknown Did it involve sudden or severe rash/hives, skin peeling, or any reaction on the inside of your mouth or nose? Unknown Did you need to seek medical attention at a hospital or doctor's office? Unknown When did it last happen? teenager If all above answers are "NO", may proceed with cephalosporin use.   Sudafed [pseudoephedrine Hcl] Rash      Medication List    STOP taking these medications   oxyCODONE-acetaminophen 7.5-325 MG tablet Commonly known as: PERCOCET   predniSONE 20 MG tablet Commonly known as: DELTASONE   promethazine 25 MG tablet Commonly known as: PHENERGAN     TAKE these medications   benzonatate 100 MG capsule Commonly known as: Tessalon Perles Take 1 capsule (100 mg total) by mouth 3 (three) times daily as needed for cough.   diazepam 5 MG tablet Commonly known as: VALIUM TAKE ONE TABLET BY MOUTH AT BEDTIME AS NEEDED FOR ANXIETY What changed:   how much to take  how to take this  when to take this  reasons to take this  additional instructions   docusate sodium 100  MG capsule Commonly known as: Colace Take 1 capsule (100 mg total) by mouth 2 (two) times daily.   ibuprofen 600 MG tablet Commonly known as: ADVIL TAKE ONE TABLET BY MOUTH EVERY 6 HOURS AS NEEDED. What changed:  reasons to take this   oxyCODONE 5 MG immediate release tablet Commonly known as: Oxy IR/ROXICODONE Take 1-2 tablets (5-10 mg total) by mouth every 4 (four) hours as needed for severe pain.   senna-docusate 8.6-50 MG tablet Commonly known as: Senokot-S Take 1 tablet by mouth at bedtime.   SUMAtriptan 100 MG tablet Commonly known as: IMITREX TAKE ONE TABLET BY MOUTH PRN UP to TWICE DAILY AS NEEDED. What changed:   how much to take  how to take this  when to take this  reasons to take this  additional instructions   traMADol 50 MG tablet Commonly known as: ULTRAM Take 50 mg by mouth every 8 (eight) hours as needed for pain.      Follow-up Information    Ivin Poot, MD Follow up on 07/29/2018.   Specialty: Cardiothoracic Surgery Why: Appointment is at 11:15, please get CXR at 10:45 at [facility] located on first floor of our office building Contact information: [address] [place] [address] [phone]          Allergies  Allergen Reactions  . Morphine And Related Itching  . Nortriptyline Other (See Comments)    Significant weight gain  . Topamax [Topiramate] Diarrhea    nausea  . Actifed Cold-Allergy [Chlorpheniramine-Phenyleph Er] Rash  . Amoxicillin Rash    Did it involve swelling of the face/tongue/throat, SOB, or low BP? Unknown Did it involve sudden or severe rash/hives, skin peeling, or any reaction on the inside of your mouth or nose? Unknown Did you need to seek medical attention at a hospital or doctor's office? Unknown When did it last happen? teenager If all above answers are "NO", may proceed with cephalosporin use.   . Codeine Hives  . Erythromycin Rash  . Penicillins Rash    Did it involve swelling of the face/tongue/throat, SOB, or low BP? Unknown Did it involve sudden or severe rash/hives, skin peeling, or any reaction on the inside of your mouth or nose? Unknown Did you need to seek medical  attention at a hospital or doctor's office? Unknown When did it last happen? teenager If all above answers are "NO", may proceed with cephalosporin use.   [name] [Pseudoephedrine Hcl] Rash    Consultations:  CT surgery  Oncology  Procedures/Studies: Dg Chest 1 View  Result Date: 07/07/2018 CLINICAL DATA:  Post thoracentesis on the left. EXAM: CHEST  1 VIEW COMPARISON:  Radiographs 07/03/2018 and 06/24/2016.  CT 07/06/2018. FINDINGS: There has been partial re-expansion of the left lung following thoracentesis. There is still a sizable left pleural effusion with near complete whiteout of the left hemithorax. No residual mediastinal shift or pneumothorax identified. The right lung is clear. IMPRESSION: No pneumothorax following thoracentesis on the left. Residual sizable left pleural effusion and left lung collapse. Electronically Signed   By: Richardean Sale M.D.   On: 07/07/2018 10:31   Dg Chest 2 View  Result Date: 07/16/2018 CLINICAL DATA:  Follow-up chest tube removal EXAM: CHEST - 2 VIEW COMPARISON:  07/15/2018 FINDINGS: Cardiac shadow is stable. Right-sided PICC line is again seen and stable. One of the 2 left-sided chest tubes has been removed in the interval. No pneumothorax is identified. Persistent left basilar atelectasis and effusion is seen.  IMPRESSION: Interval chest tube removal on the left without recurrent pneumothorax. Stable atelectatic changes and effusion in the left base. Electronically Signed   By: Inez Catalina M.D.   On: 07/16/2018 08:29   Dg Chest 2 View  Result Date: 07/08/2018 CLINICAL DATA:  Thoracentesis, left-sided chest pain EXAM: CHEST - 2 VIEW COMPARISON:  07/07/2018 FINDINGS: No significant change in a large left pleural effusion, with near-total consolidation of the left lung. There is no acute appearing airspace opacity of the aerated portion of the left pulmonary apex. No significant pneumothorax. The right lung is normally aerated. The cardiac  silhouette is predominantly obscured. Disc degenerative disease of the thoracic spine IMPRESSION: No significant change in a large left pleural effusion, with near-total consolidation of the left lung. There is no acute appearing airspace opacity of the aerated portion of the left pulmonary apex. No significant pneumothorax. The right lung is normally aerated. Electronically Signed   By: Eddie Candle M.D.   On: 07/08/2018 09:37   Ct Chest W Contrast  Result Date: 07/09/2018 CLINICAL DATA:  Large, reaccumulating left pleural effusion EXAM: CT CHEST WITH CONTRAST TECHNIQUE: Multidetector CT imaging of the chest was performed during intravenous contrast administration. CONTRAST:  71mL OMNIPAQUE IOHEXOL 300 MG/ML  SOLN COMPARISON:  Chest radiograph, 07/09/2018 FINDINGS: Cardiovascular: No significant vascular findings. Normal heart size. No pericardial effusion. Mediastinum/Nodes: There are prominent although not pathologically enlarged mediastinal lymph nodes. Moderate hiatal hernia. Thyroid gland, trachea, and esophagus demonstrate no significant findings. Lungs/Pleura: There is a moderate, loculated left hydropneumothorax with a small air component and moderate fluid component. The largest loculated component is located posteriorly. There is a pigtail drainage catheter about the lateral pleural space. There is a small right pleural effusion with associated atelectasis or consolidation and a subpleural consolidation of the superior segment right lower lobe (series 4, image 56). Upper Abdomen: No acute abnormality. Musculoskeletal: No chest wall mass or suspicious bone lesions identified. IMPRESSION: 1. There is a moderate, loculated left hydropneumothorax with a small air component and moderate fluid component. The largest loculated component is located posteriorly. There is a pigtail drainage catheter about the lateral pleural space. There is no obvious etiology, such as obvious mass or pleural disease. 2. There  is a small right pleural effusion with associated atelectasis or consolidation and a subpleural consolidation of the superior segment right lower lobe (series 4, image 56), of uncertain significance, possibly infectious or inflammatory. Electronically Signed   By: Eddie Candle M.D.   On: 07/09/2018 11:31   Ct Abdomen Pelvis W Contrast  Result Date: 07/10/2018 CLINICAL DATA:  Large left pleural effusion, suspect gyn primary malignancy EXAM: CT ABDOMEN AND PELVIS WITH CONTRAST TECHNIQUE: Multidetector CT imaging of the abdomen and pelvis was performed using the standard protocol following bolus administration of intravenous contrast. CONTRAST:  196mL OMNIPAQUE IOHEXOL 300 MG/ML  SOLN COMPARISON:  CT chest, 07/09/2018, CT abdomen pelvis, 05/10/2011 FINDINGS: Lower chest: Loculated left-sided pleural effusion with left-sided pleural drainage catheter in position. Small right pleural effusion. Hepatobiliary: No focal liver abnormality is seen. Status post cholecystectomy. No biliary dilatation. Pancreas: Unremarkable. No pancreatic ductal dilatation or surrounding inflammatory changes. Spleen: Normal in size without significant abnormality. Adrenals/Urinary Tract: Adrenal glands are unremarkable. Kidneys are normal, without renal calculi, solid lesion, or hydronephrosis. Bladder is unremarkable. Stomach/Bowel: Stomach is within normal limits. Appendix appears normal. No evidence of bowel wall thickening, distention, or inflammatory changes. Large burden of stool in the colon. Vascular/Lymphatic: No significant vascular findings are present. No  enlarged abdominal or pelvic lymph nodes. Reproductive: The bilateral ovaries are enlarged by heterogeneous appearing cystic lesions, measuring 5.3 x 4.2 cm on the right (series 4, image 72) and 4.5 x 3.2 cm on the left (series 4, image 75). Other: No abdominal wall hernia or abnormality. Trace ascites. There is some suggestion of omental and peritoneal nodularity (e.g. Series  4, image 43). Musculoskeletal: No acute or significant osseous findings. IMPRESSION: 1. The bilateral ovaries are enlarged by heterogeneous appearing cystic lesions, measuring 5.3 x 4.2 cm on the right (series 4, image 72) and 4.5 x 3.2 cm on the left (series 4, image 75). Consider dedicated pelvic ultrasound and/or pelvic MRI to further evaluate for solid components given high suspicion for GYN primary malignancy. 2. No other evidence of mass and no lymphadenopathy in the abdomen or pelvis. 3. Trace ascites. There is some suggestion of omental and peritoneal nodularity (e.g. Series 4, image 70), concerning for peritoneal metastatic disease. 4. Loculated left-sided pleural effusion with left-sided pleural drainage catheter in position. Small right pleural effusion. Electronically Signed   By: Eddie Candle M.D.   On: 07/10/2018 21:14   Dg Chest Port 1 View  Result Date: 07/15/2018 CLINICAL DATA:  Chest 2 EXAM: PORTABLE CHEST 1 VIEW COMPARISON:  Portable exam 6734 hours compared to 07/14/2018 FINDINGS: Pair of LEFT thoracostomy tubes are again identified. RIGHT arm PICC line tip projects over SVC. Upper normal heart size. Mediastinal contours and pulmonary vascularity normal. Minimal pleural effusion at RIGHT base. Tiny LEFT apex pneumothorax. Decreased atelectasis LEFT lower lobe. Remaining lungs clear. IMPRESSION: Minimal LEFT pleural effusion and tiny LEFT pneumothorax despite thoracostomy tubes. Decreased atelectasis LEFT lower lobe. Electronically Signed   By: Lavonia Dana M.D.   On: 07/15/2018 08:50   Dg Chest Port 1 View  Result Date: 07/14/2018 CLINICAL DATA:  Follow-up pleural effusion EXAM: PORTABLE CHEST 1 VIEW COMPARISON:  07/13/2010 FINDINGS: Cardiac shadow is stable. Two chest tubes are again identified on the left. No definitive pneumothorax is identified. Mild haziness is noted at the left base consistent with small residual effusion. Left retrocardiac atelectasis is again noted. Right-sided PICC  line is seen in satisfactory position. IMPRESSION: Stable chest tubes on the left. No pneumothorax is noted. Persistent basilar density is noted consistent with atelectasis and small effusion. Electronically Signed   By: Inez Catalina M.D.   On: 07/14/2018 07:06   Dg Chest Port 1 View  Result Date: 07/13/2018 CLINICAL DATA:  Status post left-sided VATS. EXAM: PORTABLE CHEST 1 VIEW COMPARISON:  July 13, 2018 FINDINGS: The patient has been extubated. The right-sided PICC line is well position. Two left-sided chest tubes are in place. There is no large pneumothorax. Subcutaneous gas is noted along the patient's left flank. A dense retrocardiac opacity is noted. There are small bilateral pleural effusions. No right-sided pneumothorax. No acute osseous abnormality. IMPRESSION: 1. Status post extubation.  Remaining lines and tubes as above. 2. Persistent dense retrocardiac opacity, stable from prior study. 3. Subcutaneous gas along the patient's left flank, slightly increased from prior study. Electronically Signed   By: Constance Holster M.D.   On: 07/13/2018 21:45   Dg Chest Port 1 View  Addendum Date: 07/13/2018   ADDENDUM REPORT: 07/13/2018 18:14 ADDENDUM: The patient's RN reports that the patient has been extubated at this time. Electronically Signed   By: Genevie Ann M.D.   On: 07/13/2018 18:14   Result Date: 07/13/2018 CLINICAL DATA:  48 year old female status post left VATS today for loculated effusion.  Postoperative day zero. EXAM: PORTABLE CHEST 1 VIEW COMPARISON:  07/11/2018 and earlier. FINDINGS: Portable AP supine view at 1801 hours. Two left chest tubes are in place. Both tubes course toward the medial left apex. No pneumothorax identified. Endotracheal tube in place with evidence of a left mainstem bronchus intubation. Stable right IJ PICC line. Lower lung volumes. Stable cardiac size and mediastinal contours. Regressed but not fully resolved dense left lung base opacity. Negative visible bowel gas  pattern. Stable cholecystectomy clips. IMPRESSION: 1. Left mainstem bronchus intubation suspected. Withdrawal the ET tube 2 cm and repeat a portable chest. 2. Two left chest tubes in place. No pneumothorax identified. 3. Lower lung volumes.  Mildly improved left lung base ventilation. Electronically Signed: By: Genevie Ann M.D. On: 07/13/2018 18:01   Dg Chest Port 1 View  Result Date: 07/11/2018 CLINICAL DATA:  48 year old female with a history of left-sided pleural effusion. EXAM: PORTABLE CHEST 1 VIEW COMPARISON:  07/10/2018, 07/09/2018, CT 07/09/2010 FINDINGS: Cardiomediastinal silhouette unchanged with the left heart border partially obscured by lung/pleural disease. Unchanged left thoracostomy tube. Contour of the medial aspect of the left upper lobe is visualized at the apex, the so-called "luftsichel sign", new from the comparison and representing partial collapse of the left upper lobe. Right lung relatively well aerated. No new confluent airspace disease Unchanged right upper extremity PICC. IMPRESSION: Unchanged left-sided thoracostomy tube, with hydropneumothorax. The fluid component is relatively unchanged at the left lung base, with new left upper lobe collapse and small gas component at the apex. Opacity at the left lung base likely a combination of pleural fluid/loculated pleural fluid and associated atelectasis/consolidation. Unchanged right upper extremity PICC. Electronically Signed   By: Corrie Mckusick D.O.   On: 07/11/2018 08:53   Dg Chest Port 1 View  Result Date: 07/10/2018 CLINICAL DATA:  Pleural effusion EXAM: PORTABLE CHEST 1 VIEW COMPARISON:  Chest CT and chest radiograph July 09, 2018 FINDINGS: Chest tube position is unchanged. No pneumothorax is demonstrable by radiography. Central catheter tip is in the superior vena cava. Moderate pleural effusion persists on the left with compressive atelectasis in the left lower lobe. No new opacity evident on either side. Heart is upper normal in  size, stable, with pulmonary vascularity within normal limits. No adenopathy. No bone lesions. IMPRESSION: Persistent left pleural effusion with left base compressive atelectasis. No new opacity evident. Stable cardiac silhouette. Tube and catheter positions unchanged without demonstrable pneumothorax by radiography. Electronically Signed   By: Lowella Grip III M.D.   On: 07/10/2018 09:33   Dg Chest Port 1 View  Result Date: 07/09/2018 CLINICAL DATA:  Left pleural effusion.  Chest tube in place. EXAM: PORTABLE CHEST 1 VIEW COMPARISON:  PA and lateral chest 07/08/2018. FINDINGS: New right PICC is identified with its tip projecting at the superior cavoatrial junction. The patient also has a new pigtail catheter in the left chest. Small to moderate left pleural effusion is decreased compared to the prior examination. No pneumothorax. Left basilar airspace disease persists. Small focus of discoid atelectasis right mid lung noted. Heart size is upper normal. IMPRESSION: Decreased left pleural effusion with a new chest tube in place. Negative for pneumothorax. Electronically Signed   By: Inge Rise M.D.   On: 07/09/2018 09:14   Dg Chest Port 1 View  Result Date: 07/07/2018 CLINICAL DATA:  Left-sided pleural effusion status post thoracentesis EXAM: PORTABLE CHEST 1 VIEW COMPARISON:  July 07, 2018 FINDINGS: There is a persistent large left-sided pleural effusion. There is no  pneumothorax. The heart is difficult to fully evaluate on this exam. The right lung field is mostly clear. There is some mild volume overload. IMPRESSION: Interval decrease in size of the previously demonstrated left-sided pleural effusion. There is a persistent large left-sided pleural effusion without evidence of a pneumothorax. Electronically Signed   By: Constance Holster M.D.   On: 07/07/2018 23:33   Ir Picc Placement Right >5 Yrs Inc Img Guide  Result Date: 07/08/2018 CLINICAL DATA:  Large left pleural effusion, poor IV  access EXAM: PICC PLACEMENT WITH ULTRASOUND AND FLUOROSCOPY FLUOROSCOPY TIME:  18 seconds; 3 mGy TECHNIQUE: After written informed consent was obtained, patient was placed in the supine position on angiographic table. Patency of the right brachial vein was confirmed with ultrasound with image documentation. An appropriate skin site was determined. Skin site was marked. Region was prepped using maximum barrier technique including cap and mask, sterile gown, sterile gloves, large sterile sheet, and Chlorhexidine as cutaneous antisepsis. The region was infiltrated locally with 1% lidocaine. Under real-time ultrasound guidance, the right brachial vein was accessed with a 21 gauge micropuncture needle; the needle tip within the vein was confirmed with ultrasound image documentation. Needle exchanged over a 018 guidewire for a peel-away sheath, through which a 5-French double-lumen power injectable PICC trimmed to 38cm was advanced, positioned with its tip near the cavoatrial junction. Spot chest radiograph confirms appropriate catheter position. Catheter was flushed per protocol and secured externally. The patient tolerated procedure well. COMPLICATIONS: COMPLICATIONS none IMPRESSION: 1. Technically successful five [place] double lumen power injectable PICC placement Electronically Signed   By: Lucrezia Europe M.D.   On: 07/08/2018 16:40   Ir Thoracentesis Asp Pleural Space W/img Guide  Result Date: 07/07/2018 INDICATION: Patient with history of migraines, tobacco abuse and new onset dyspnea with large left pleural effusion of uncertain etiology. Request for diagnostic and therapeutic left side thoracentesis today in IR. EXAM: ULTRASOUND GUIDED LEFT THORACENTESIS MEDICATIONS: 13 mL 1% lidocaine COMPLICATIONS: None immediate. PROCEDURE: An ultrasound guided thoracentesis was thoroughly discussed with the patient and questions answered. The benefits, risks, alternatives and complications were also discussed. The patient  understands and wishes to proceed with the procedure. Written consent was obtained. Ultrasound was performed to localize and mark an adequate pocket of fluid in the left chest. The area was then prepped and draped in the normal sterile fashion. 1% Lidocaine was used for local anesthesia. Under ultrasound guidance a 6 Fr Safe-T-Centesis catheter was introduced. Thoracentesis was performed. The catheter was removed and a dressing applied. FINDINGS: A total of approximately 2.0 L of golden yellow, blood tinged fluid was removed. Samples were sent to the laboratory as requested by the clinical team. IMPRESSION: Successful ultrasound guided left thoracentesis yielding 2.0 L of pleural fluid. Read by Candiss Norse, PA-C Electronically Signed   By: Sandi Mariscal M.D.   On: 07/07/2018 10:36   Ir Perc Pleural Drain W/[place] Cath W/img Guide  Result Date: 07/08/2018 CLINICAL DATA:  Large left pleural effusion EXAM: LEFT CHEST TUBE PLACEMENT UNDER ULTRASOUND AND FLUOROSCOPIC GUIDANCE ANESTHESIA/SEDATION: Intravenous Fentanyl 166mcg and Versed 4mg  were administered as conscious sedation during continuous monitoring of the patient's level of consciousness and physiological / cardiorespiratory status by the radiology RN, with a total moderate sedation time of 49 minutes. PROCEDURE: The procedure, risks, benefits, and alternatives were explained to the patient. Questions regarding the procedure were encouraged and answered. The patient understands and consents to the procedure. appropriate skin entry site was determined with ultrasound and marked. The  operative field was prepped with chlorhexidinein a sterile fashion, and a sterile drape was applied covering the operative field. A sterile gown and sterile gloves were used for the procedure. Local anesthesia was provided with 1% Lidocaine. Under ultrasound guidance, a 19 gauge percutaneous entry needle advanced into the left pleural space from a lateral approach.  Blood-tinged fluid could be aspirated. Amplatz guidewire advanced towards the apex under fluoroscopy. Tract dilated to facilitate placement of 14 French pigtail catheter, directed towards the apex. Catheter secured externally with 0 Prolene suture and placed to Pleur-evac. 500 mL returned spontaneously into the canister without suction. Site was covered with a sterile dressing the patient returned to her room in good condition. COMPLICATIONS: None immediate FINDINGS: Large left pleural effusion was noted. 14 French pigtail catheter placed as above. IMPRESSION: 1. Technically successful placement of left 14 French pigtail chest drain, placed to Pleur-evac water-seal. Electronically Signed   By: Lucrezia Europe M.D.   On: 07/08/2018 16:42     Subjective: Eager to go home  Discharge Exam: Vitals:   07/16/18 0409 07/16/18 0805  BP: (!) 99/52 (!) 99/56  Pulse:    Resp: 15 19  Temp: 98.6 F (37 C) 98.2 F (36.8 C)  SpO2: 92%    Vitals:   07/15/18 2319 07/16/18 0404 07/16/18 0409 07/16/18 0805  BP: (!) 97/47  (!) 99/52 (!) 99/56  Pulse: 86     Resp: 16 16 15 19   Temp: 97.6 F (36.4 C)  98.6 F (37 C) 98.2 F (36.8 C)  TempSrc: Oral  Oral Oral  SpO2: 94% 92% 92%   Weight:      Height:        General: Pt is alert, awake, not in acute distress Cardiovascular: RRR, S1/S2 +, no rubs, no gallops Respiratory: CTA bilaterally, no wheezing, no rhonchi Abdominal: Soft, NT, ND, bowel sounds + Extremities: no edema, no cyanosis   The results of significant diagnostics from this hospitalization (including imaging, microbiology, ancillary and laboratory) are listed below for reference.     Microbiology: Recent Results (from the past 240 hour(s))  SARS Coronavirus 2 (CEPHEID- Performed in [place] hospital lab), Hosp Order     Status: None   Collection Time: 07/06/18  8:37 PM   Specimen: Nasopharyngeal Swab  Result Value Ref Range Status   SARS Coronavirus 2 NEGATIVE NEGATIVE Final     Comment: (NOTE) If result is NEGATIVE SARS-CoV-2 target nucleic acids are NOT DETECTED. The SARS-CoV-2 RNA is generally detectable in upper and lower  respiratory specimens during the acute phase of infection. The lowest  concentration of SARS-CoV-2 viral copies this assay can detect is 250  copies / mL. A negative result does not preclude SARS-CoV-2 infection  and should not be used as the sole basis for treatment or other  patient management decisions.  A negative result may occur with  improper specimen collection / handling, submission of specimen other  than nasopharyngeal swab, presence of viral mutation(s) within the  areas targeted by this assay, and inadequate number of viral copies  (<250 copies / mL). A negative result must be combined with clinical  observations, patient history, and epidemiological information. If result is POSITIVE SARS-CoV-2 target nucleic acids are DETECTED. The SARS-CoV-2 RNA is generally detectable in upper and lower  respiratory specimens dur ing the acute phase of infection.  Positive  results are indicative of active infection with SARS-CoV-2.  Clinical  correlation with patient history and other diagnostic information is  necessary to  determine patient infection status.  Positive results do  not rule out bacterial infection or co-infection with other viruses. If result is PRESUMPTIVE POSTIVE SARS-CoV-2 nucleic acids MAY BE PRESENT.   A presumptive positive result was obtained on the submitted specimen  and confirmed on repeat testing.  While 2019 novel coronavirus  (SARS-CoV-2) nucleic acids may be present in the submitted sample  additional confirmatory testing may be necessary for epidemiological  and / or clinical management purposes  to differentiate between  SARS-CoV-2 and other Sarbecovirus currently known to infect humans.  If clinically indicated additional testing with an alternate test  methodology [phone]) is advised. The SARS-CoV-2  RNA is generally  detectable in upper and lower respiratory sp ecimens during the acute  phase of infection. The expected result is Negative. Fact Sheet for Patients:  StrictlyIdeas.no Fact Sheet for Healthcare Providers: BankingDealers.co.za This test is not yet approved or cleared by the [place] FDA and has been authorized for detection and/or diagnosis of SARS-CoV-2 by FDA under an Emergency Use Authorization (EUA).  This EUA will remain in effect (meaning this test can be used) for the duration of the COVID-19 declaration under Section 564(b)(1) of the Act, 21 U.S.C. section 360bbb-3(b)(1), unless the authorization is terminated or revoked sooner. Performed at [facility], [place] [address] [place], [place] [address]   Culture, blood (Routine X 2) w Reflex to ID Panel     Status: None   Collection Time: 07/07/18 12:05 AM   Specimen: BLOOD  Result Value Ref Range Status   Specimen Description BLOOD RIGHT ARM  Final   Special Requests   Final    BOTTLES DRAWN AEROBIC ONLY Blood Culture adequate volume   Culture   Final    NO GROWTH 5 DAYS Performed at [facility], [place] [address], [place], [place] [address]    Report Status 07/12/2018 FINAL  Final  Culture, blood (Routine X 2) w Reflex to ID Panel     Status: None   Collection Time: 07/07/18 12:10 AM   Specimen: BLOOD  Result Value Ref Range Status   Specimen Description BLOOD RIGHT ARM  Final   Special Requests   Final    BOTTLES DRAWN AEROBIC ONLY Blood Culture adequate volume   Culture   Final    NO GROWTH 5 DAYS Performed at [facility], [place] [address], [place], [place] [address]    Report Status 07/12/2018 FINAL  Final  MRSA PCR Screening     Status: None   Collection Time: 07/07/18  1:42 AM   Specimen: Nasal Mucosa; Nasopharyngeal  Result Value Ref Range Status   MRSA by PCR NEGATIVE NEGATIVE Final    Comment:        The GeneXpert MRSA Assay  (FDA approved for NASAL specimens only), is one component of a comprehensive MRSA colonization surveillance program. It is not intended to diagnose MRSA infection nor to guide or monitor treatment for MRSA infections. Performed at [facility], [place] [address] [place], [place] [address]   Fungus Culture With Stain     Status: None (Preliminary result)   Collection Time: 07/07/18 10:10 AM   Specimen: Nasal Mucosa; Pleural Fluid  Result Value Ref Range Status   Fungus Stain Final report  Final    Comment: (NOTE) Performed At: [facility] [place], [place] [id number] Rush Farmer MD JM:[id number]    Fungus (Mycology) Culture PENDING  Incomplete   Fungal Source PLEURAL  Final    Comment: LEFT Performed at [facility], [place] [address], [place], [place] [address]   Culture, body fluid-bottle     Status: None   Collection Time: 07/07/18 10:10 AM   Specimen: Pleura  Result Value Ref Range Status   Specimen Description PLEURAL LEFT  Final   Special Requests NONE  Final   Culture   Final    NO GROWTH 5 DAYS Performed at [place] [address], [place], [place] [address]    Report Status 07/12/2018 FINAL  Final  Gram stain     Status: None   Collection Time: 07/07/18 10:10 AM   Specimen: Pleura  Result Value Ref Range Status   Specimen Description PLEURAL LEFT  Final   Special Requests NONE  Final   Gram Stain   Final    ABUNDANT WBC PRESENT,BOTH PMN AND MONONUCLEAR NO ORGANISMS SEEN Performed at [facility], [place] [address] [place], [place] [address]    Report Status 07/07/2018 FINAL  Final  Acid Fast Smear (AFB)     Status: None   Collection Time: 07/07/18 10:10 AM   Specimen: Nasal Mucosa; Pleural Fluid  Result Value Ref Range Status   AFB Specimen Processing Comment  Final    Comment: Tissue Grinding and Digestion/Decontamination   Acid Fast Smear Negative  Final    Comment: (NOTE) Performed At: [facility] [address] [place], [place] [id number] Rush Farmer MD [id number]    Source (AFB) PLEURAL  Final    Comment: Performed at [facility], [place] [address], [place], [place] [address]  Fungus Culture Result     Status: None   Collection Time: 07/07/18 10:10 AM  Result Value Ref Range Status   Result 1 Comment  Final    Comment: (NOTE) KOH/Calcofluor preparation:  no fungus observed. Performed At: [facility] [place], [place] [id number] Rush Farmer MD DX:[id number]   Expectorated sputum assessment w rflx to resp cult     Status: None   Collection Time: 07/07/18  5:55 PM   Specimen: Expectorated Sputum  Result Value Ref Range Status   Specimen Description EXPECTORATED SPUTUM  Final   Special Requests NONE  Final   Sputum evaluation   Final    THIS SPECIMEN IS ACCEPTABLE FOR SPUTUM CULTURE Performed at [facility], [address] [place], [place] [address]    Report Status 07/07/2018 FINAL  Final  Culture, respiratory     Status: None   Collection Time: 07/07/18  5:55 PM  Result Value Ref Range Status   Specimen Description EXPECTORATED SPUTUM  Final   Special Requests NONE Reflexed from [id number]  Final   Gram Stain   Final    ABUNDANT WBC PRESENT, PREDOMINANTLY PMN FEW GRAM POSITIVE COCCI    Culture   Final    Consistent with normal respiratory flora. Performed at [facility], [place] [address] [place],  [address]    Report Status 07/10/2018 FINAL  Final     Labs: BNP (last 3 results) Recent Labs    07/07/18 0010  BNP 76.5   Basic Metabolic Panel: Recent Labs  Lab 07/10/18 0445  07/12/18 1219 07/13/18 0441 07/14/18 0424 07/14/18 0438 07/15/18 0424 07/16/18 0425  NA 138   < > 137 137 139 138 138 137  K 3.9   < > 3.7 4.0 4.2 4.3 4.5 4.1  CL 103   < > 101 104  104  --  101 100  CO2 26   < > 26 26 25   --  27 28  GLUCOSE 102*   < > 147* 102* 156*  --  119* 90  BUN 5*   < > <5* 6 <5*  --  12 8   CREATININE 0.55   < > 0.59 0.48 0.58  --  0.66 0.69  CALCIUM 8.5*   < > 8.6* 8.2* 9.2  --  8.8* 8.6*  MG 1.9  --   --   --   --   --   --   --   PHOS 3.5  --   --   --   --   --   --   --    < > = values in this interval not displayed.   Liver Function Tests: Recent Labs  Lab 07/12/18 1219 07/15/18 0424  AST 57* 19  ALT 141* 53*  ALKPHOS 322* 203*  BILITOT 0.6 0.5  PROT 5.8* 5.5*  ALBUMIN 2.2* 2.2*   No results for input(s): LIPASE, AMYLASE in the last 168 hours. No results for input(s): AMMONIA in the last 168 hours. CBC: Recent Labs  Lab 07/10/18 0445  07/12/18 1219 07/13/18 0441 07/14/18 0424 07/14/18 0438 07/15/18 0424 07/16/18 0425  WBC 11.3*   < > 11.5* 9.5 18.0*  --  15.7* 11.3*  NEUTROABS 7.8*  --   --   --   --   --   --   --   HGB 13.3   < > 12.8 11.4* 11.8* 12.2 10.6* 10.1*  HCT 40.5   < > 38.4 35.4* 35.9* 36.0 33.7* 31.8*  MCV 98.3   < > 98.5 100.3* 98.6  --  101.5* 101.6*  PLT 364   < > 347 333 397  --  386 377   < > = values in this interval not displayed.   Cardiac Enzymes: No results for input(s): CKTOTAL, CKMB, CKMBINDEX, TROPONINI in the last 168 hours. BNP: Invalid input(s): POCBNP CBG: Recent Labs  Lab 07/14/18 2113 07/15/18 0620 07/15/18 1718 07/15/18 2118 07/16/18 0630  GLUCAP 124* 88 127* 73 68*   D-Dimer No results for input(s): DDIMER in the last 72 hours. Hgb A1c No results for input(s): HGBA1C in the last 72 hours. Lipid Profile No results for input(s): CHOL, HDL, LDLCALC, TRIG, CHOLHDL, LDLDIRECT in the last 72 hours. Thyroid function studies No results for input(s): TSH, T4TOTAL, T3FREE, THYROIDAB in the last 72 hours.  Invalid input(s): FREET3 Anemia work up No results for input(s): VITAMINB12, FOLATE, FERRITIN, TIBC, IRON, RETICCTPCT in the last 72 hours. Urinalysis    Component Value Date/Time   COLORURINE YELLOW 07/12/2018 [place] 07/12/2018 1548   LABSPEC 1.010 07/12/2018 1548   PHURINE 8.0  07/12/2018 1548   GLUCOSEU NEGATIVE 07/12/2018 1548   HGBUR SMALL (A) 07/12/2018 1548   BILIRUBINUR NEGATIVE 07/12/2018 1548   KETONESUR NEGATIVE 07/12/2018 1548   PROTEINUR NEGATIVE 07/12/2018 1548   NITRITE NEGATIVE 07/12/2018 1548   LEUKOCYTESUR NEGATIVE 07/12/2018 1548   Sepsis Labs Invalid input(s): PROCALCITONIN,  WBC,  LACTICIDVEN Microbiology Recent Results (from the past 240 hour(s))  SARS Coronavirus 2 (CEPHEID- Performed in [place] hospital lab), Hosp Order     Status: None   Collection Time: 07/06/18  8:37 PM   Specimen: Nasopharyngeal Swab  Result Value Ref Range Status   SARS Coronavirus 2 NEGATIVE NEGATIVE Final    Comment: (NOTE) If result is NEGATIVE SARS-CoV-2  target nucleic acids are NOT DETECTED. The SARS-CoV-2 RNA is generally detectable in upper and lower  respiratory specimens during the acute phase of infection. The lowest  concentration of SARS-CoV-2 viral copies this assay can detect is 250  copies / mL. A negative result does not preclude SARS-CoV-2 infection  and should not be used as the sole basis for treatment or other  patient management decisions.  A negative result may occur with  improper specimen collection / handling, submission of specimen other  than nasopharyngeal swab, presence of viral mutation(s) within the  areas targeted by this assay, and inadequate number of viral copies  (<250 copies / mL). A negative result must be combined with clinical  observations, patient history, and epidemiological information. If result is POSITIVE SARS-CoV-2 target nucleic acids are DETECTED. The SARS-CoV-2 RNA is generally detectable in upper and lower  respiratory specimens dur ing the acute phase of infection.  Positive  results are indicative of active infection with SARS-CoV-2.  Clinical  correlation with patient history and other diagnostic information is  necessary to determine patient infection status.  Positive results do  not rule out  bacterial infection or co-infection with other viruses. If result is PRESUMPTIVE POSTIVE SARS-CoV-2 nucleic acids MAY BE PRESENT.   A presumptive positive result was obtained on the submitted specimen  and confirmed on repeat testing.  While 2019 novel coronavirus  (SARS-CoV-2) nucleic acids may be present in the submitted sample  additional confirmatory testing may be necessary for epidemiological  and / or clinical management purposes  to differentiate between  SARS-CoV-2 and other Sarbecovirus currently known to infect humans.  If clinically indicated additional testing with an alternate test  methodology [phone]) is advised. The SARS-CoV-2 RNA is generally  detectable in upper and lower respiratory sp ecimens during the acute  phase of infection. The expected result is Negative. Fact Sheet for Patients:  StrictlyIdeas.no Fact Sheet for Healthcare Providers: BankingDealers.co.za This test is not yet approved or cleared by the [place] FDA and has been authorized for detection and/or diagnosis of SARS-CoV-2 by FDA under an Emergency Use Authorization (EUA).  This EUA will remain in effect (meaning this test can be used) for the duration of the COVID-19 declaration under Section 564(b)(1) of the Act, 21 U.S.C. section 360bbb-3(b)(1), unless the authorization is terminated or revoked sooner. Performed at [facility], [place] [address], [place], [place] [address]   Culture, blood (Routine X 2) w Reflex to ID Panel     Status: None   Collection Time: 07/07/18 12:05 AM   Specimen: BLOOD  Result Value Ref Range Status   Specimen Description BLOOD RIGHT ARM  Final   Special Requests   Final    BOTTLES DRAWN AEROBIC ONLY Blood Culture adequate volume   Culture   Final    NO GROWTH 5 DAYS Performed at [facility], [place] [address] [place], [place] [address]    Report Status 07/12/2018 FINAL  Final  Culture, blood (Routine  X 2) w Reflex to ID Panel     Status: None   Collection Time: 07/07/18 12:10 AM   Specimen: BLOOD  Result Value Ref Range Status   Specimen Description BLOOD RIGHT ARM  Final   Special Requests   Final    BOTTLES DRAWN AEROBIC ONLY Blood Culture adequate volume   Culture   Final    NO GROWTH 5 DAYS Performed at [facility], [place] [address], [place], [place] [address]  Report Status 07/12/2018 FINAL  Final  MRSA PCR Screening     Status: None   Collection Time: 07/07/18  1:42 AM   Specimen: Nasal Mucosa; Nasopharyngeal  Result Value Ref Range Status   MRSA by PCR NEGATIVE NEGATIVE Final    Comment:        The GeneXpert MRSA Assay (FDA approved for NASAL specimens only), is one component of a comprehensive MRSA colonization surveillance program. It is not intended to diagnose MRSA infection nor to guide or monitor treatment for MRSA infections. Performed at [facility], [place] [address] [place], [place] [address]   Fungus Culture With Stain     Status: None (Preliminary result)   Collection Time: 07/07/18 10:10 AM   Specimen: Nasal Mucosa; Pleural Fluid  Result Value Ref Range Status   Fungus Stain Final report  Final    Comment: (NOTE) Performed At: [facility] [place], [place] [id number] Rush Farmer MD [id number]    Fungus (Mycology) Culture PENDING  Incomplete   Fungal Source PLEURAL  Final    Comment: LEFT Performed at [facility], [place] [address] [place], [place] [address]   Culture, body fluid-bottle     Status: None   Collection Time: 07/07/18 10:10 AM   Specimen: Pleura  Result Value Ref Range Status   Specimen Description PLEURAL LEFT  Final   Special Requests NONE  Final   Culture   Final    NO GROWTH 5 DAYS Performed at [place] [address], [place], [place] [address]    Report Status 07/12/2018 FINAL  Final  Gram stain     Status: None   Collection Time: 07/07/18 10:10 AM   Specimen:  Pleura  Result Value Ref Range Status   Specimen Description PLEURAL LEFT  Final   Special Requests NONE  Final   Gram Stain   Final    ABUNDANT WBC PRESENT,BOTH PMN AND MONONUCLEAR NO ORGANISMS SEEN Performed at [facility], [place] [address] [place], [place] [address]    Report Status 07/07/2018 FINAL  Final  Acid Fast Smear (AFB)     Status: None   Collection Time: 07/07/18 10:10 AM   Specimen: Nasal Mucosa; Pleural Fluid  Result Value Ref Range Status   AFB Specimen Processing Comment  Final    Comment: Tissue Grinding and Digestion/Decontamination   Acid Fast Smear Negative  Final    Comment: (NOTE) Performed At: [facility] [address] [place], [place] [id number] Rush Farmer MD [id number]    Source (AFB) PLEURAL  Final    Comment: Performed at [facility], [place] [address], [place],  [address]  Fungus Culture Result     Status: None   Collection Time: 07/07/18 10:10 AM  Result Value Ref Range Status   Result 1 Comment  Final    Comment: (NOTE) KOH/Calcofluor preparation:  no fungus observed. Performed At: [facility] [place], [place] [id number] Rush Farmer MD OI:[id number]   Expectorated sputum assessment w rflx to resp cult     Status: None   Collection Time: 07/07/18  5:55 PM   Specimen: Expectorated Sputum  Result Value Ref Range Status   Specimen Description EXPECTORATED SPUTUM  Final   Special Requests NONE  Final   Sputum evaluation   Final    THIS SPECIMEN IS ACCEPTABLE FOR SPUTUM CULTURE Performed at [facility], [address] [place],  [address]  Report Status 07/07/2018 FINAL  Final  Culture, respiratory     Status: None   Collection Time: 07/07/18  5:55 PM  Result Value Ref Range Status   Specimen Description EXPECTORATED SPUTUM  Final   Special Requests NONE Reflexed from [id number]  Final   Gram Stain   Final    ABUNDANT WBC PRESENT, PREDOMINANTLY PMN FEW GRAM POSITIVE  COCCI    Culture   Final    Consistent with normal respiratory flora. Performed at [facility], [place] [address] [place], [place] [address]    Report Status 07/10/2018 FINAL  Final   Time spent: 30 min  SIGNED:   Marylu Lund, MD  [facility] 07/16/2018, 9:54 AM  If 7PM-7AM, please contact night-coverage

## 2018-07-16 NOTE — Discharge Instructions (Signed)
Pleur-x catheter Care  Please drain MWF, record output    Video-Assisted Thoracic Surgery, Care After This sheet gives you information about how to care for yourself after your procedure. Your health care provider may also give you more specific instructions. If you have problems or questions, contact your health care provider. What can I expect after the procedure? After the procedure, it is common to have:  Some pain and soreness in your chest.  Pain when breathing in (inhaling) and coughing.  Constipation.  Fatigue.  Difficulty sleeping. Follow these instructions at home: Preventing pneumonia  Take deep breaths or do breathing exercises as instructed by your health care provider. Doing this helps prevent lung infection (pneumonia).  Cough frequently. Coughing may cause discomfort, but it is important to clear mucus (phlegm) and expand your lungs. If it hurts to cough, hold a pillow against your chest or place the palms of both hands on top of the incision (use splinting) when you cough. This may help relieve discomfort.  If you were given an incentive spirometer, use it as directed. An incentive spirometer is a tool that measures how well you are filling your lungs with each breath.  Participate in pulmonary rehabilitation as directed by your health care provider. This is a program that combines education, exercise, and support from a team of specialists. The goal is to help you heal and get back to your normal activities as soon as possible. Medicines  Take over-the-counter or prescription medicines only as told by your health care provider.  If you have pain, take pain-relieving medicine before your pain becomes severe. This is important because if your pain is under control, you will be able to breathe and cough more comfortably.  If you were prescribed an antibiotic medicine, take it as told by your health care provider. Do not stop taking the antibiotic even if you start  to feel better. Activity  Ask your health care provider what activities are safe for you.  Avoid activities that use your chest muscles for at least 3-4 weeks.  Do not lift anything that is heavier than 10 lb (4.5 kg), or the limit that your health care provider tells you, until he or she says that it is safe. Incision care  Follow instructions from your health care provider about how to take care of your incision(s). Make sure you: ? Wash your hands with soap and water before you change your bandage (dressing). If soap and water are not available, use hand sanitizer. ? Change your dressing as told by your health care provider. ? Leave stitches (sutures), skin glue, or adhesive strips in place. These skin closures may need to stay in place for 2 weeks or longer. If adhesive strip edges start to loosen and curl up, you may trim the loose edges. Do not remove adhesive strips completely unless your health care provider tells you to do that.  Keep your dressing dry until it has been removed.  Check your incision area every day for signs of infection. Check for: ? Redness, swelling, or pain. ? Fluid or blood. ? Warmth. ? Pus or a bad smell. Bathing  Do not take baths, swim, or use a hot tub until your health care provider approves. You may take showers.  After your dressing has been removed, use soap and water to gently wash your incision area. Do not use anything else to clean your incision(s) unless your health care provider tells you to do this. Driving   Do not  drive until your health care provider approves.  Do not drive or use heavy machinery while taking prescription pain medicine. Eating and drinking  Eat a healthy, balanced diet as instructed by your health care provider. A healthy diet includes plenty of fresh fruits and vegetables, whole grains, and low-fat (lean) proteins.  Limit foods that are high in fat and processed sugars, such as fried and sweet foods.  Drink  enough fluid to keep your urine clear or pale yellow. General instructions   To prevent or treat constipation while you are taking prescription pain medicine, your health care provider may recommend that you: ? Take over-the-counter or prescription medicines. ? Eat foods that are high in fiber, such as beans, fresh fruits and vegetables, and whole grains.  Do not use any products that contain nicotine or tobacco, such as cigarettes and e-cigarettes. If you need help quitting, ask your health care provider.  Avoid secondhand smoke.  Wear compression stockings as told by your health care provider. These stockings help to prevent blood clots and reduce swelling in your legs.  If you have a chest tube, care for it as instructed by your health care provider. Do not travel by airplane during the 2 weeks after your chest tube is removed, or until your health care provider says that this is safe.  Keep all follow-up visits as told by your health care provider. This is important. Contact a health care provider if:  You have redness, swelling, or pain around an incision.  You have fluid or blood coming from an incision.  Your incision area feels warm to the touch.  You have pus or a bad smell coming from an incision.  You have a fever or chills.  You have nausea or vomiting.  You have pain that does not get better with medicine. Get help right away if:  You have chest pain.  Your heart is fluttering or beating rapidly.  You develop a rash.  You have shortness of breath or trouble breathing.  You are confused.  You have trouble speaking.  You feel weak, light-headed, or dizzy.  You faint. Summary  To help prevent lung infection (pneumonia), take deep breaths or do breathing exercises as instructed by your health care provider.  Cough frequently to clear mucus (phlegm) and expand your lungs. If it hurts to cough, hold a pillow against your chest or place the palms of both  hands on top of the incision (use splinting) when you cough.  If you have pain, take pain-relieving medicine before your pain becomes severe. This is important because if your pain is under control, you will be able to breathe and cough more comfortably.  Ask your health care provider what activities are safe for you. This information is not intended to replace advice given to you by your health care provider. Make sure you discuss any questions you have with your health care provider. Document Released: 04/27/2012 Document Revised: 12/13/2016 Document Reviewed: 12/11/2015 Elsevier Patient Education  2020 [employer].

## 2018-07-18 ENCOUNTER — Other Ambulatory Visit: Payer: Self-pay

## 2018-07-18 ENCOUNTER — Encounter (HOSPITAL_COMMUNITY): Payer: Self-pay | Admitting: Emergency Medicine

## 2018-07-18 ENCOUNTER — Emergency Department (HOSPITAL_COMMUNITY): Payer: Medicaid Other

## 2018-07-18 ENCOUNTER — Inpatient Hospital Stay (HOSPITAL_COMMUNITY)
Admission: EM | Admit: 2018-07-18 | Discharge: 2018-07-21 | DRG: 391 | Disposition: A | Discharge status: Home / Self Care | Payer: Medicaid Other | Attending: Internal Medicine | Admitting: Internal Medicine

## 2018-07-18 DIAGNOSIS — K2970 Gastritis, unspecified, without bleeding: Secondary | ICD-10-CM | POA: Diagnosis present

## 2018-07-18 DIAGNOSIS — K210 Gastro-esophageal reflux disease with esophagitis: Secondary | ICD-10-CM | POA: Diagnosis present

## 2018-07-18 DIAGNOSIS — F17210 Nicotine dependence, cigarettes, uncomplicated: Secondary | ICD-10-CM | POA: Diagnosis present

## 2018-07-18 DIAGNOSIS — K222 Esophageal obstruction: Secondary | ICD-10-CM | POA: Diagnosis not present

## 2018-07-18 DIAGNOSIS — C801 Malignant (primary) neoplasm, unspecified: Secondary | ICD-10-CM | POA: Diagnosis not present

## 2018-07-18 DIAGNOSIS — K922 Gastrointestinal hemorrhage, unspecified: Secondary | ICD-10-CM | POA: Diagnosis present

## 2018-07-18 DIAGNOSIS — D62 Acute posthemorrhagic anemia: Secondary | ICD-10-CM | POA: Diagnosis present

## 2018-07-18 DIAGNOSIS — C561 Malignant neoplasm of right ovary: Secondary | ICD-10-CM | POA: Diagnosis present

## 2018-07-18 DIAGNOSIS — E785 Hyperlipidemia, unspecified: Secondary | ICD-10-CM | POA: Diagnosis present

## 2018-07-18 DIAGNOSIS — J90 Pleural effusion, not elsewhere classified: Secondary | ICD-10-CM | POA: Diagnosis present

## 2018-07-18 DIAGNOSIS — K259 Gastric ulcer, unspecified as acute or chronic, without hemorrhage or perforation: Secondary | ICD-10-CM | POA: Diagnosis present

## 2018-07-18 DIAGNOSIS — Z9049 Acquired absence of other specified parts of digestive tract: Secondary | ICD-10-CM | POA: Diagnosis not present

## 2018-07-18 DIAGNOSIS — J189 Pneumonia, unspecified organism: Secondary | ICD-10-CM

## 2018-07-18 DIAGNOSIS — D649 Anemia, unspecified: Secondary | ICD-10-CM

## 2018-07-18 DIAGNOSIS — K269 Duodenal ulcer, unspecified as acute or chronic, without hemorrhage or perforation: Secondary | ICD-10-CM | POA: Diagnosis present

## 2018-07-18 DIAGNOSIS — Z1159 Encounter for screening for other viral diseases: Secondary | ICD-10-CM

## 2018-07-18 DIAGNOSIS — K449 Diaphragmatic hernia without obstruction or gangrene: Secondary | ICD-10-CM | POA: Diagnosis present

## 2018-07-18 DIAGNOSIS — Z79899 Other long term (current) drug therapy: Secondary | ICD-10-CM

## 2018-07-18 DIAGNOSIS — C562 Malignant neoplasm of left ovary: Secondary | ICD-10-CM | POA: Diagnosis present

## 2018-07-18 DIAGNOSIS — K228 Other specified diseases of esophagus: Secondary | ICD-10-CM | POA: Diagnosis present

## 2018-07-18 DIAGNOSIS — K920 Hematemesis: Secondary | ICD-10-CM | POA: Diagnosis present

## 2018-07-18 DIAGNOSIS — Z791 Long term (current) use of non-steroidal anti-inflammatories (NSAID): Secondary | ICD-10-CM | POA: Diagnosis not present

## 2018-07-18 DIAGNOSIS — C563 Malignant neoplasm of bilateral ovaries: Secondary | ICD-10-CM | POA: Diagnosis present

## 2018-07-18 DIAGNOSIS — K921 Melena: Secondary | ICD-10-CM | POA: Diagnosis not present

## 2018-07-18 DIAGNOSIS — Y95 Nosocomial condition: Secondary | ICD-10-CM | POA: Diagnosis present

## 2018-07-18 DIAGNOSIS — D539 Nutritional anemia, unspecified: Secondary | ICD-10-CM | POA: Diagnosis present

## 2018-07-18 LAB — URINALYSIS, ROUTINE W REFLEX MICROSCOPIC
Bilirubin Urine: NEGATIVE
Glucose, UA: NEGATIVE mg/dL
Hgb urine dipstick: NEGATIVE
Ketones, ur: NEGATIVE mg/dL
Leukocytes,Ua: NEGATIVE
Nitrite: NEGATIVE
Protein, ur: NEGATIVE mg/dL
Specific Gravity, Urine: 1.011 (ref 1.005–1.030)
pH: 5 (ref 5.0–8.0)

## 2018-07-18 LAB — CBC WITH DIFFERENTIAL/PLATELET
Abs Immature Granulocytes: 0.93 10*3/uL — ABNORMAL HIGH (ref 0.00–0.07)
Basophils Absolute: 0 10*3/uL (ref 0.0–0.1)
Basophils Relative: 0 %
Eosinophils Absolute: 0.2 10*3/uL (ref 0.0–0.5)
Eosinophils Relative: 1 %
HCT: 12.8 % — ABNORMAL LOW (ref 36.0–46.0)
Hemoglobin: 4.1 g/dL — CL (ref 12.0–15.0)
Immature Granulocytes: 3 %
Lymphocytes Relative: 8 %
Lymphs Abs: 2.2 10*3/uL (ref 0.7–4.0)
MCH: 32.8 pg (ref 26.0–34.0)
MCHC: 32 g/dL (ref 30.0–36.0)
MCV: 102.4 fL — ABNORMAL HIGH (ref 80.0–100.0)
Monocytes Absolute: 2.1 10*3/uL — ABNORMAL HIGH (ref 0.1–1.0)
Monocytes Relative: 7 %
Neutro Abs: 23.3 10*3/uL — ABNORMAL HIGH (ref 1.7–7.7)
Neutrophils Relative %: 81 %
Platelets: 520 10*3/uL — ABNORMAL HIGH (ref 150–400)
RBC: 1.25 MIL/uL — ABNORMAL LOW (ref 3.87–5.11)
RDW: 13.2 % (ref 11.5–15.5)
WBC: 28.7 10*3/uL — ABNORMAL HIGH (ref 4.0–10.5)
nRBC: 0.2 % (ref 0.0–0.2)

## 2018-07-18 LAB — COMPREHENSIVE METABOLIC PANEL
ALT: 48 U/L — ABNORMAL HIGH (ref 0–44)
AST: 32 U/L (ref 15–41)
Albumin: 2.6 g/dL — ABNORMAL LOW (ref 3.5–5.0)
Alkaline Phosphatase: 242 U/L — ABNORMAL HIGH (ref 38–126)
Anion gap: 14 (ref 5–15)
BUN: 25 mg/dL — ABNORMAL HIGH (ref 6–20)
CO2: 27 mmol/L (ref 22–32)
Calcium: 8.3 mg/dL — ABNORMAL LOW (ref 8.9–10.3)
Chloride: 92 mmol/L — ABNORMAL LOW (ref 98–111)
Creatinine, Ser: 0.91 mg/dL (ref 0.44–1.00)
GFR calc Af Amer: >60 mL/min (ref >60)
GFR calc non Af Amer: >60 mL/min (ref >60)
Glucose, Bld: 103 mg/dL — ABNORMAL HIGH (ref 70–99)
Potassium: 3.9 mmol/L (ref 3.5–5.1)
Sodium: 133 mmol/L — ABNORMAL LOW (ref 135–145)
Total Bilirubin: 0.7 mg/dL (ref 0.3–1.2)
Total Protein: 6 g/dL — ABNORMAL LOW (ref 6.5–8.1)

## 2018-07-18 LAB — POC URINE PREG, ED: Preg Test, Ur: NEGATIVE

## 2018-07-18 LAB — LACTIC ACID, PLASMA
Lactic Acid, Venous: 1.2 mmol/L (ref 0.5–1.9)
Lactic Acid, Venous: 0.8 mmol/L (ref 0.5–1.9)

## 2018-07-18 LAB — LACTATE DEHYDROGENASE: LDH: 277 U/L — ABNORMAL HIGH (ref 98–192)

## 2018-07-18 LAB — PREPARE RBC (CROSSMATCH)

## 2018-07-18 LAB — CBG MONITORING, ED: Glucose-Capillary: 99 mg/dL (ref 70–99)

## 2018-07-18 LAB — PROTIME-INR
INR: 1.1 (ref 0.8–1.2)
Prothrombin Time: 14.3 seconds (ref 11.4–15.2)

## 2018-07-18 LAB — ABO/RH: ABO/RH(D): O POS

## 2018-07-18 LAB — POC OCCULT BLOOD, ED: Fecal Occult Bld: NEGATIVE

## 2018-07-18 LAB — SARS CORONAVIRUS 2 BY RT PCR (HOSPITAL ORDER, PERFORMED IN ~~LOC~~ HOSPITAL LAB): SARS Coronavirus 2: NEGATIVE

## 2018-07-18 MED ORDER — DIAZEPAM 5 MG PO TABS
5.0000 mg | ORAL_TABLET | Freq: Every evening | ORAL | Status: DC | PRN
  Administered 2018-07-19 – 2018-07-20 (×2): 5 mg via ORAL
  Filled 2018-07-18 (×2): qty 1

## 2018-07-18 MED ORDER — SODIUM CHLORIDE 0.9 % IV SOLN: 10.0000 mL/h | Freq: Once | INTRAVENOUS | Status: DC

## 2018-07-18 MED ORDER — SODIUM CHLORIDE 0.9% IV SOLUTION: Freq: Once | INTRAVENOUS | Status: DC

## 2018-07-18 MED ORDER — DOCUSATE SODIUM 100 MG PO CAPS
100.0000 mg | ORAL_CAPSULE | Freq: Two times a day (BID) | ORAL | Status: DC
  Administered 2018-07-19 – 2018-07-21 (×5): 100 mg via ORAL
  Filled 2018-07-18 (×6): qty 1

## 2018-07-18 MED ORDER — VANCOMYCIN HCL IN DEXTROSE 1-5 GM/200ML-% IV SOLN
1000.0000 mg | Freq: Two times a day (BID) | INTRAVENOUS | Status: DC
  Administered 2018-07-19 – 2018-07-20 (×3): 1000 mg via INTRAVENOUS
  Filled 2018-07-18 (×4): qty 200

## 2018-07-18 MED ORDER — PANTOPRAZOLE SODIUM 40 MG IV SOLR
40.0000 mg | Freq: Two times a day (BID) | INTRAVENOUS | Status: DC
  Administered 2018-07-18 – 2018-07-20 (×5): 40 mg via INTRAVENOUS
  Filled 2018-07-18 (×6): qty 40

## 2018-07-18 MED ORDER — LEVOFLOXACIN IN D5W 750 MG/150ML IV SOLN
750.0000 mg | INTRAVENOUS | Status: DC
  Administered 2018-07-19: 750 mg via INTRAVENOUS
  Filled 2018-07-18: qty 150

## 2018-07-18 MED ORDER — VANCOMYCIN HCL 10 G IV SOLR
INTRAVENOUS | Status: AC
  Filled 2018-07-18: qty 1500

## 2018-07-18 MED ORDER — SODIUM CHLORIDE 0.9 % IV SOLN
1.0000 g | INTRAVENOUS | Status: DC
  Administered 2018-07-18: 1 g via INTRAVENOUS
  Filled 2018-07-18: qty 10

## 2018-07-18 MED ORDER — SODIUM CHLORIDE 0.9% IV SOLUTION
Freq: Once | INTRAVENOUS | Status: AC
  Administered 2018-07-18: 16:00:00 via INTRAVENOUS

## 2018-07-18 MED ORDER — OXYCODONE HCL 5 MG PO TABS
5.0000 mg | ORAL_TABLET | ORAL | Status: DC | PRN
  Administered 2018-07-18: 5 mg via ORAL
  Administered 2018-07-18 – 2018-07-21 (×14): 10 mg via ORAL
  Filled 2018-07-18 (×15): qty 2

## 2018-07-18 MED ORDER — PROMETHAZINE HCL 12.5 MG PO TABS
12.5000 mg | ORAL_TABLET | Freq: Once | ORAL | Status: AC
  Administered 2018-07-18: 12.5 mg via ORAL
  Filled 2018-07-18: qty 1

## 2018-07-18 MED ORDER — SODIUM CHLORIDE 0.9 % IV SOLN
2.0000 g | Freq: Once | INTRAVENOUS | Status: AC
  Administered 2018-07-18: 2 g via INTRAVENOUS
  Filled 2018-07-18: qty 2

## 2018-07-18 MED ORDER — SODIUM CHLORIDE 0.9% IV SOLUTION
Freq: Once | INTRAVENOUS | Status: AC
  Administered 2018-07-18: 17:00:00 via INTRAVENOUS

## 2018-07-18 MED ORDER — PROMETHAZINE HCL 12.5 MG PO TABS
25.0000 mg | ORAL_TABLET | Freq: Four times a day (QID) | ORAL | Status: DC | PRN
  Administered 2018-07-18: 25 mg via ORAL
  Filled 2018-07-18: qty 2

## 2018-07-18 MED ORDER — SENNOSIDES-DOCUSATE SODIUM 8.6-50 MG PO TABS
1.0000 | ORAL_TABLET | Freq: Every day | ORAL | Status: DC
  Administered 2018-07-19 – 2018-07-20 (×2): 1 via ORAL
  Filled 2018-07-18 (×2): qty 1

## 2018-07-18 MED ORDER — SODIUM CHLORIDE 0.9 % IV SOLN
500.0000 mg | INTRAVENOUS | Status: DC
  Administered 2018-07-18: 500 mg via INTRAVENOUS
  Filled 2018-07-18: qty 500

## 2018-07-18 MED ORDER — VANCOMYCIN HCL 1.5 G IV SOLR
1500.0000 mg | Freq: Once | INTRAVENOUS | Status: AC
  Administered 2018-07-18: 1500 mg via INTRAVENOUS
  Filled 2018-07-18: qty 1500

## 2018-07-18 MED ORDER — OXYCODONE-ACETAMINOPHEN 5-325 MG PO TABS
2.0000 | ORAL_TABLET | Freq: Once | ORAL | Status: AC
  Administered 2018-07-18: 2 via ORAL
  Filled 2018-07-18: qty 2

## 2018-07-18 NOTE — H&P (Signed)
History and Physical  TOVA VATER JOI:[id number] DOB: 15-Jan-1970 DOA: 07/18/2018  Referring physician: Marcene Brawn, PA-C, ED provider PCP: [name], MD  Outpatient Specialists:   Patient Coming From: home  Chief Complaint: weak, lightheaded, SOB  HPI: Michelle Holt is a 48 y.o. female with a history of anxiety depression, and insomnia.  Patient recently hospitalized due to collapsed lung with large pleural effusion on the left side.  She was admitted to the hospital and had a thoracentesis, with L VATS procedure for loculated pleural effusion.  At the time, she was being treated for pneumonia as well.  The patient began to have some abdominal pain on the day of discharge (7/2) and had a large black stool prior to leaving the hospital.  Upon leaving, the patient felt very weak tired and had difficulty getting into the house upon getting home.  Her symptoms continued to get worse over the next couple of days.  She reported another couple of small black stools but none today.  Her symptoms are worsening.  No palliating or provoking factors.  She came to the emergency department for evaluation.  Emergency Department Course: Open 4.1 (down from 10.1 on day of discharge).  White count 28.7 which is increased from 11.3.  X-ray shows moderate pleural effusion on the left side with underlying consolidation.  COVID negative.  Lactic acid normal.  Crossmatched for 2 units and transfused.  Started on aztreonam and vancomycin.  Blood cultures obtained.  Review of Systems:   Pt denies any fevers, chills, nausea, vomiting, diarrhea, constipation, abdominal pain, shortness of breath, dyspnea on exertion, orthopnea, cough, wheezing, palpitations, headache, vision changes, lightheadedness, dizziness, rectal bleeding.  Review of systems are otherwise negative  Past Medical History:  Diagnosis Date  . Anxiety and depression   . Arthritis of facet joints at multiple vertebral levels    L5-S1  .  Constipation   . Dyslipidemia   . Insomnia   . Irritable bowel syndrome   . Migraine   . Muscle tension headache   . Plantar fasciitis of right foot    Past Surgical History:  Procedure Laterality Date  . CHOLECYSTECTOMY  2008  . COLONOSCOPY N/A 08/13/2013   Procedure: COLONOSCOPY;  Surgeon: Rogene Houston, MD;  [facility];  Service: Endoscopy;  Laterality: N/A;  230-moved to 145 Ann to notify pt  . ESOPHAGOGASTRODUODENOSCOPY    . IR PERC PLEURAL DRAIN W/INDWELL CATH W/IMG GUIDE  07/08/2018  . IR THORACENTESIS ASP PLEURAL SPACE W/IMG GUIDE  07/07/2018  . PLEURAL EFFUSION DRAINAGE Left 07/13/2018   Procedure: DRAINAGE OF LOCULATED PLEURAL EFFUSION;  Surgeon: Ivin Poot, MD;  Location: [facility];  Service: Thoracic;  Laterality: Left;  . TALC PLEURODESIS Left 07/13/2018   Procedure: Talc Pleuradesis;  Surgeon: Prescott Gum, Collier Salina, MD;  Location: [facility];  Service: Thoracic;  Laterality: Left;  . TUBAL LIGATION Bilateral   . UTERINE ABLATION    . VIDEO ASSISTED THORACOSCOPY Left 07/13/2018   Procedure: VIDEO ASSISTED THORACOSCOPY;  Surgeon: Ivin Poot, MD;  Location: [facility];  Service: Thoracic;  Laterality: Left;   Social History:  reports that she has been smoking cigarettes. She has a 8.50 pack-year smoking history. She has never used smokeless tobacco. She reports current alcohol use of about 1.0 standard drinks of alcohol per week. She reports that she does not use drugs. Patient lives at home  Allergies  Allergen Reactions  . Morphine And Related Itching  . Nortriptyline  Other (See Comments)    Significant weight gain  . Topamax [Topiramate] Diarrhea    nausea  . Actifed Cold-Allergy [Chlorpheniramine-Phenyleph Er] Rash  . Amoxicillin Rash    Did it involve swelling of the face/tongue/throat, SOB, or low BP? Unknown Did it involve sudden or severe rash/hives, skin peeling, or any reaction on the inside of your mouth or nose? Unknown Did you need to seek medical  attention at a hospital or doctor's office? Unknown When did it last happen? teenager If all above answers are "NO", may proceed with cephalosporin use.   . Codeine Hives  . Erythromycin Rash  . Penicillins Rash    Did it involve swelling of the face/tongue/throat, SOB, or low BP? Unknown Did it involve sudden or severe rash/hives, skin peeling, or any reaction on the inside of your mouth or nose? Unknown Did you need to seek medical attention at a hospital or doctor's office? Unknown When did it last happen? teenager If all above answers are "NO", may proceed with cephalosporin use.   Michelle Holt [Pseudoephedrine Hcl] Rash    Family History  Problem Relation Age of Onset  . Hypertension Mother   . Obesity Mother   . Diabetes Mother   . Peripheral vascular disease Father   . COPD Brother   . Osteoporosis Brother   . Colon cancer Neg Hx       Prior to Admission medications   Medication Sig Start Date End Date Taking? Authorizing Provider  benzonatate (TESSALON PERLES) 100 MG capsule Take 1 capsule (100 mg total) by mouth 3 (three) times daily as needed for cough. 07/16/18 07/16/19 Yes Donne Hazel, MD  diazepam (VALIUM) 5 MG tablet TAKE ONE TABLET BY MOUTH AT BEDTIME AS NEEDED FOR ANXIETY Patient taking differently: Take 5 mg by mouth at bedtime as needed for anxiety.  02/11/18  Yes Kathrynn Ducking, MD  docusate sodium (COLACE) 100 MG capsule Take 1 capsule (100 mg total) by mouth 2 (two) times daily. 07/16/18 10/14/18 Yes Donne Hazel, MD  ibuprofen (ADVIL) 600 MG tablet TAKE ONE TABLET BY MOUTH EVERY 6 HOURS AS NEEDED. Patient taking differently: Take 600 mg by mouth every 6 (six) hours as needed for headache.  05/18/18  Yes Kathrynn Ducking, MD  oxyCODONE (OXY IR/ROXICODONE) 5 MG immediate release tablet Take 1-2 tablets (5-10 mg total) by mouth every 4 (four) hours as needed for severe pain. 07/16/18  Yes Donne Hazel, MD  promethazine (PHENERGAN) 25 MG tablet Take 1  tablet (25 mg total) by mouth every 6 (six) hours as needed for nausea, vomiting or refractory nausea / vomiting. 07/16/18  Yes Donne Hazel, MD  senna-docusate (SENOKOT-S) 8.6-50 MG tablet Take 1 tablet by mouth at bedtime. 07/16/18 08/15/18 Yes Donne Hazel, MD  SUMAtriptan (IMITREX) 100 MG tablet TAKE ONE TABLET BY MOUTH PRN UP to TWICE DAILY AS NEEDED. Patient taking differently: Take 100 mg by mouth 2 (two) times daily as needed for migraine.  02/11/18  Yes Kathrynn Ducking, MD  traMADol (ULTRAM) 50 MG tablet Take 50 mg by mouth every 8 (eight) hours as needed for pain. 07/03/18  Yes [provider]    Physical Exam: BP 100/60   Pulse 85   Temp 97.9 F (36.6 C) (Oral)   Resp 13   Ht 5\' 6"  (1.676 m)   Wt 75.8 kg   SpO2 97%   BMI 26.95 kg/m   . General: Middle-aged Caucasian female. Awake and alert and  oriented x3. No acute cardiopulmonary distress.  Marland Kitchen HEENT: Normocephalic atraumatic.  Right and left ears normal in appearance.  Pupils equal, round, reactive to light. Extraocular muscles are intact. Sclerae anicteric and noninjected.  Dry mucosal membranes.  Pale conjunctiva.  No mucosal lesions.  . Neck: Neck supple without lymphadenopathy. No carotid bruits. No masses palpated.  . Cardiovascular: Regular rate with normal S1-S2 sounds. No murmurs, rubs, gallops auscultated. No JVD.  Marland Kitchen Respiratory: Diminished breath sounds on left.  Possible Rales on the left base. No accessory muscle use. . Abdomen: Soft, nontender, nondistended. Active bowel sounds. No masses or hepatosplenomegaly  . Skin: No rashes, lesions, or ulcerations.  Dry, warm to touch. 2+ dorsalis pedis and radial pulses. . Musculoskeletal: No calf or leg pain. All major joints not erythematous nontender.  No upper or lower joint deformation.  Good ROM.  No contractures  . Psychiatric: Intact judgment and insight. Pleasant and cooperative. . Neurologic: No focal neurological deficits. Strength is 5/5 and symmetric in  upper and lower extremities.  Cranial nerves II through XII are grossly intact.           Labs on Admission: I have personally reviewed following labs and imaging studies  CBC: Recent Labs  Lab 07/13/18 0441 07/14/18 0424 07/14/18 0438 07/15/18 0424 07/16/18 0425 07/18/18 1425  WBC 9.5 18.0*  --  15.7* 11.3* 28.7*  NEUTROABS  --   --   --   --   --  23.3*  HGB 11.4* 11.8* 12.2 10.6* 10.1* 4.1*  HCT 35.4* 35.9* 36.0 33.7* 31.8* 12.8*  MCV 100.3* 98.6  --  101.5* 101.6* 102.4*  PLT 333 397  --  386 377 470*   Basic Metabolic Panel: Recent Labs  Lab 07/13/18 0441 07/14/18 0424 07/14/18 0438 07/15/18 0424 07/16/18 0425 07/18/18 1425  NA 137 139 138 138 137 133*  K 4.0 4.2 4.3 4.5 4.1 3.9  CL 104 104  --  101 100 92*  CO2 26 25  --  27 28 27   GLUCOSE 102* 156*  --  119* 90 103*  BUN 6 <5*  --  12 8 25*  CREATININE 0.48 0.58  --  0.66 0.69 0.91  CALCIUM 8.2* 9.2  --  8.8* 8.6* 8.3*   GFR: Estimated Creatinine Clearance: 78.7 mL/min (by C-G formula based on SCr of 0.91 mg/dL). Liver Function Tests: Recent Labs  Lab 07/12/18 1219 07/15/18 0424 07/18/18 1425  AST 57* 19 32  ALT 141* 53* 48*  ALKPHOS 322* 203* 242*  BILITOT 0.6 0.5 0.7  PROT 5.8* 5.5* 6.0*  ALBUMIN 2.2* 2.2* 2.6*   No results for input(s): LIPASE, AMYLASE in the last 168 hours. No results for input(s): AMMONIA in the last 168 hours. Coagulation Profile: Recent Labs  Lab 07/12/18 1219 07/13/18 0441 07/18/18 1425  INR 1.1 1.0 1.1   Cardiac Enzymes: No results for input(s): CKTOTAL, CKMB, CKMBINDEX, TROPONINI in the last 168 hours. BNP (last 3 results) No results for input(s): PROBNP in the last 8760 hours. HbA1C: No results for input(s): HGBA1C in the last 72 hours. CBG: Recent Labs  Lab 07/15/18 1718 07/15/18 2118 07/16/18 0630 07/16/18 1107 07/18/18 1553  GLUCAP 127* 73 68* 67* 99   Lipid Profile: No results for input(s): CHOL, HDL, LDLCALC, TRIG, CHOLHDL, LDLDIRECT in the last  72 hours. Thyroid Function Tests: No results for input(s): TSH, T4TOTAL, FREET4, T3FREE, THYROIDAB in the last 72 hours. Anemia Panel: No results for input(s): VITAMINB12, FOLATE, FERRITIN, TIBC, IRON, RETICCTPCT  in the last 72 hours. Urine analysis:    Component Value Date/Time   COLORURINE YELLOW 07/18/2018 1803   APPEARANCEUR CLEAR 07/18/2018 1803   LABSPEC 1.011 07/18/2018 1803   PHURINE 5.0 07/18/2018 1803   GLUCOSEU NEGATIVE 07/18/2018 1803   HGBUR NEGATIVE 07/18/2018 1803   BILIRUBINUR NEGATIVE 07/18/2018 1803   KETONESUR NEGATIVE 07/18/2018 1803   PROTEINUR NEGATIVE 07/18/2018 1803   NITRITE NEGATIVE 07/18/2018 1803   LEUKOCYTESUR NEGATIVE 07/18/2018 1803   Sepsis Labs: @LABRCNTIP (procalcitonin:4,lacticidven:4) ) Recent Results (from the past 240 hour(s))  Culture, blood (Routine x 2)     Status: None (Preliminary result)   Collection Time: 07/18/18  2:25 PM   Specimen: BLOOD RIGHT HAND  Result Value Ref Range Status   Specimen Description   Final    BLOOD RIGHT HAND BOTTLES DRAWN AEROBIC AND ANAEROBIC   Special Requests   Final    Blood Culture adequate volume Performed at [facility], [address] [place], [place] [address]    Culture PENDING  Incomplete   Report Status PENDING  Incomplete  Culture, blood (Routine x 2)     Status: None (Preliminary result)   Collection Time: 07/18/18  2:38 PM   Specimen: Left Antecubital; Blood  Result Value Ref Range Status   Specimen Description   Final    LEFT ANTECUBITAL BOTTLES DRAWN AEROBIC AND ANAEROBIC   Special Requests   Final    Blood Culture results may not be optimal due to an excessive volume of blood received in culture bottles Performed at [facility], [address], [place], [place] [address]    Culture PENDING  Incomplete   Report Status PENDING  Incomplete  SARS Coronavirus 2 (CEPHEID - Performed in [facility] hospital lab), Hosp Order     Status: None   Collection Time: 07/18/18  5:02 PM   Specimen:  Nasopharyngeal Swab  Result Value Ref Range Status   SARS Coronavirus 2 NEGATIVE NEGATIVE Final    Comment: (NOTE) If result is NEGATIVE SARS-CoV-2 target nucleic acids are NOT DETECTED. The SARS-CoV-2 RNA is generally detectable in upper and lower  respiratory specimens during the acute phase of infection. The lowest  concentration of SARS-CoV-2 viral copies this assay can detect is 250  copies / mL. A negative result does not preclude SARS-CoV-2 infection  and should not be used as the sole basis for treatment or other  patient management decisions.  A negative result may occur with  improper specimen collection / handling, submission of specimen other  than nasopharyngeal swab, presence of viral mutation(s) within the  areas targeted by this assay, and inadequate number of viral copies  (<250 copies / mL). A negative result must be combined with clinical  observations, patient history, and epidemiological information. If result is POSITIVE SARS-CoV-2 target nucleic acids are DETECTED. The SARS-CoV-2 RNA is generally detectable in upper and lower  respiratory specimens dur ing the acute phase of infection.  Positive  results are indicative of active infection with SARS-CoV-2.  Clinical  correlation with patient history and other diagnostic information is  necessary to determine patient infection status.  Positive results do  not rule out bacterial infection or co-infection with other viruses. If result is PRESUMPTIVE POSTIVE SARS-CoV-2 nucleic acids MAY BE PRESENT.   A presumptive positive result was obtained on the submitted specimen  and confirmed on repeat testing.  While 2019 novel coronavirus  (SARS-CoV-2) nucleic acids may be present in the submitted sample  additional confirmatory testing may be necessary  for epidemiological  and / or clinical management purposes  to differentiate between  SARS-CoV-2 and other Sarbecovirus currently known to infect humans.  If clinically  indicated additional testing with an alternate test  methodology [phone]) is advised. The SARS-CoV-2 RNA is generally  detectable in upper and lower respiratory sp ecimens during the acute  phase of infection. The expected result is Negative. Fact Sheet for Patients:  StrictlyIdeas.no Fact Sheet for Healthcare Providers: BankingDealers.co.za This test is not yet approved or cleared by the [place] FDA and has been authorized for detection and/or diagnosis of SARS-CoV-2 by FDA under an Emergency Use Authorization (EUA).  This EUA will remain in effect (meaning this test can be used) for the duration of the COVID-19 declaration under Section 564(b)(1) of the Act, 21 U.S.C. section 360bbb-3(b)(1), unless the authorization is terminated or revoked sooner. Performed at [facility], [address] [place], [place] [address]      Radiological Exams on Admission: Dg Chest 2 View  Result Date: 07/18/2018 CLINICAL DATA:  Patient with generalized weakness and fever. History of ovarian carcinoma. EXAM: CHEST - 2 VIEW COMPARISON:  Chest radiograph 07/16/2018 FINDINGS: Stable enlarged cardiac and mediastinal contours. Left chest tube remains in position. Persistent moderate left pleural effusion. Similar-appearing patchy consolidation to the left mid and upper lung. No definite pneumothorax. IMPRESSION: Left chest tube remains in position. No definite pneumothorax. Persistent moderate left pleural effusion with underlying consolidation. Electronically Signed   By: Lovey Newcomer M.D.   On: 07/18/2018 15:03    Assessment/Plan: Principal Problem:   Symptomatic anemia Active Problems:   Pleural effusion on left   Upper GI bleeding   Adenocarcinoma (HCC)   HCAP (healthcare-associated pneumonia)    This patient was discussed with the ED physician, including pertinent vitals, physical exam findings, labs, and imaging.  We also discussed care given by  the ED provider.  1. Symptomatic anemia a. Admit to stepdown b. Due to report of large melanotic stool, this is likely due to upper GI bleed. c. Although hemolytic anemia can be associated with ovarian cancer, patient's LDH is only 200, which is a little lower than what would be expected for hemolytic anemia.  Additionally, the patient's bilirubin is normal, which we would expect to be elevated if she were having hemolytic anemia. d. We will transfuse a total of 4 units of blood with 1 unit of FFP e. Check CBC following transfusion 2. Upper GI bleed a. Protonix b. Consult GI 3. Adenocarcinoma a. Likely ovarian in etiology based on abdominal and pelvis CT -patient has enlarged ovaries with cystic lesions. b. Add Ca125 blood work.   c. Patient ready has an appointment with Dr. Alvy Bimler (medical oncology) d. She will also need a work-up for ovarian cancer, including pelvic ultrasound or MRI. e. I have notified her primary OB/GYN 4. H CAP a. Will screen for MRSA b. Continue vancomycin and will change aztreonam to Levaquin c. Blood cultures d. Sputum cultures 5. Pleural effusion on the left a. Pleurx catheter in place  DVT prophylaxis: CDs as patient currently bleeding Consultants: GI Code Status: Full code Family Communication: None Disposition Plan: Patient to return home following improvement of anemia   Truett Mainland, DO

## 2018-07-18 NOTE — ED Triage Notes (Signed)
Pt brought in by ems for generalized weakness and fever. She was recently diagnosed with pleural effusion with pigtail placement in left lung for drainage. States she was recently dx with ovarian cancer and is scheduled for workup related to this.

## 2018-07-18 NOTE — ED Notes (Signed)
Patient transported to X-ray 

## 2018-07-18 NOTE — ED Provider Notes (Signed)
[facility] EMERGENCY DEPARTMENT Provider Note   CSN: [id number] Arrival date & time: 07/18/18  1406     History   Chief Complaint Chief Complaint  Patient presents with  . Fever    HPI Michelle Holt is a 48 y.o. female.     Patient is a 48 year old female who presents to the emergency department by EMS because of generalized weakness and fever.  The patient states that approximately 2 weeks ago she began having problems with shortness of breath she was seen by her primary physician and on x-ray there was noted a pleural effusion.  She went to have a CT scan and was found to have collapsed lung and fluid present.  She was evaluated by cardiothoracic surgery and IR.  She underwent video-assisted thoracostomy.  She was discharged from the hospital on Thursday, July 2.  The patient states that upon her arrival home she felt sick.  She had weakness of her legs.  She reports diarrhea most of the day on both July 2 and July 3.  On July 3 she had a temperature of 99.7.  Today she has a temperature of 100.3 at home.  She used ibuprofen and then came to the emergency department for additional evaluation because of her weakness, and her fever.  The patient says that she has been nauseated but has not been vomiting.  She says that the diarrhea has looked black and tarry at times.  She has not had any new shortness of breath, but states she just feels tired.  She says that the pain seems to be getting worse.  She presents now for assistance with this issue.  The history is provided by the patient.  Fever Associated symptoms: diarrhea and nausea   Associated symptoms: no chest pain, no confusion and no dysuria     Past Medical History:  Diagnosis Date  . Anxiety and depression   . Arthritis of facet joints at multiple vertebral levels    L5-S1  . Constipation   . Dyslipidemia   . Insomnia   . Irritable bowel syndrome   . Migraine   . Muscle tension headache   . Plantar fasciitis of right  foot     Patient Active Problem List   Diagnosis Date Noted  . Pleural effusion 07/07/2018  . Migraine 07/06/2018  . Pleural effusion on left 07/06/2018  . Acute respiratory failure with hypoxia ([place]) 07/06/2018  . Tobacco abuse 07/06/2018  . Generalized anxiety disorder 05/02/2014  . Unspecified constipation 08/10/2013  . Rectal bleeding 08/10/2013  . Lumbosacral spondylosis without myelopathy 10/23/2011  . Intractable migraine without aura 10/23/2011    Past Surgical History:  Procedure Laterality Date  . CHOLECYSTECTOMY  2008  . COLONOSCOPY N/A 08/13/2013   Procedure: COLONOSCOPY;  Surgeon: Rogene Houston, MD;  [facility]: [facility];  Service: Endoscopy;  Laterality: N/A;  230-moved to 145 Ann to notify pt  . ESOPHAGOGASTRODUODENOSCOPY    . IR PERC PLEURAL DRAIN W/INDWELL CATH W/IMG GUIDE  07/08/2018  . IR THORACENTESIS ASP PLEURAL SPACE W/IMG GUIDE  07/07/2018  . PLEURAL EFFUSION DRAINAGE Left 07/13/2018   Procedure: DRAINAGE OF LOCULATED PLEURAL EFFUSION;  Surgeon: Ivin Poot, MD;  Location: [place];  Service: Thoracic;  Laterality: Left;  . TALC PLEURODESIS Left 07/13/2018   Procedure: Talc Pleuradesis;  Surgeon: Prescott Gum, Collier Salina, MD;  Location: [place];  Service: Thoracic;  Laterality: Left;  . TUBAL LIGATION Bilateral   . UTERINE ABLATION    . VIDEO  ASSISTED THORACOSCOPY Left 07/13/2018   Procedure: VIDEO ASSISTED THORACOSCOPY;  Surgeon: Ivin Poot, MD;  Location: [facility] OR;  Service: Thoracic;  Laterality: Left;     OB History   No obstetric history on file.      Home Medications    Prior to Admission medications   Medication Sig Start Date End Date Taking? Authorizing Provider  benzonatate (TESSALON PERLES) 100 MG capsule Take 1 capsule (100 mg total) by mouth 3 (three) times daily as needed for cough. 07/16/18 07/16/19  Donne Hazel, MD  diazepam (VALIUM) 5 MG tablet TAKE ONE TABLET BY MOUTH AT BEDTIME AS NEEDED FOR ANXIETY Patient taking differently: Take 5  mg by mouth at bedtime as needed for anxiety.  02/11/18   Kathrynn Ducking, MD  docusate sodium (COLACE) 100 MG capsule Take 1 capsule (100 mg total) by mouth 2 (two) times daily. 07/16/18 10/14/18  Donne Hazel, MD  ibuprofen (ADVIL) 600 MG tablet TAKE ONE TABLET BY MOUTH EVERY 6 HOURS AS NEEDED. Patient taking differently: Take 600 mg by mouth every 6 (six) hours as needed for headache.  05/18/18   Kathrynn Ducking, MD  oxyCODONE (OXY IR/ROXICODONE) 5 MG immediate release tablet Take 1-2 tablets (5-10 mg total) by mouth every 4 (four) hours as needed for severe pain. 07/16/18   Donne Hazel, MD  promethazine (PHENERGAN) 25 MG tablet Take 1 tablet (25 mg total) by mouth every 6 (six) hours as needed for nausea, vomiting or refractory nausea / vomiting. 07/16/18   Donne Hazel, MD  senna-docusate (SENOKOT-S) 8.6-50 MG tablet Take 1 tablet by mouth at bedtime. 07/16/18 08/15/18  Donne Hazel, MD  SUMAtriptan (IMITREX) 100 MG tablet TAKE ONE TABLET BY MOUTH PRN UP to TWICE DAILY AS NEEDED. Patient taking differently: Take 100 mg by mouth 2 (two) times daily as needed for migraine.  02/11/18   Kathrynn Ducking, MD  traMADol (ULTRAM) 50 MG tablet Take 50 mg by mouth every 8 (eight) hours as needed for pain. 07/03/18   [provider]    Family History Family History  Problem Relation Age of Onset  . Hypertension Mother   . Obesity Mother   . Diabetes Mother   . Peripheral vascular disease Father   . COPD Brother   . Osteoporosis Brother   . Colon cancer Neg Hx     Social History Social History   Tobacco Use  . Smoking status: Current Every Day Smoker    Packs/day: 0.50    Years: 17.00    Pack years: 8.50    Types: Cigarettes    Last attempt to quit: 12/11/2014    Years since quitting: 3.6  . Smokeless tobacco: Never Used  Substance Use Topics  . Alcohol use: Yes    Alcohol/week: 1.0 standard drinks    Types: 1 Glasses of wine per week    Comment: Drinks alcohol on occasion   . Drug use: No     Allergies   Morphine and related, Nortriptyline, Topamax [topiramate], Actifed cold-allergy [chlorpheniramine-phenyleph er], Amoxicillin, Codeine, Erythromycin, Penicillins, and Sudafed [pseudoephedrine hcl]   Review of Systems Review of Systems  Constitutional: Positive for activity change, appetite change, fatigue and fever.       All ROS Neg except as noted in HPI  HENT: Negative for nosebleeds.   Eyes: Negative for photophobia and discharge.  Respiratory: Positive for shortness of breath.   Cardiovascular: Negative for chest pain and palpitations.  Gastrointestinal: Positive for diarrhea  and nausea. Negative for abdominal pain and blood in stool.  Genitourinary: Negative for dysuria, frequency and hematuria.  Musculoskeletal: Negative for arthralgias, back pain and neck pain.  Skin: Negative.   Neurological: Positive for weakness. Negative for dizziness, seizures and speech difficulty.  Psychiatric/Behavioral: Negative for confusion and hallucinations.     Physical Exam Updated Vital Signs BP (!) 108/58   Pulse (!) 112   Temp 99.1 F (37.3 C) (Oral)   Resp 20   Ht 5\' 6"  (1.676 m)   Wt 75.8 kg   SpO2 100%   BMI 26.95 kg/m   Physical Exam Vitals signs and nursing note reviewed.  Constitutional:      Appearance: She is well-developed. She is not toxic-appearing.  HENT:     Head: Normocephalic.     Right Ear: Tympanic membrane and external ear normal.     Left Ear: Tympanic membrane and external ear normal.     Mouth/Throat:     Comments: The soft palate area is pale Eyes:     General: Lids are normal.     Pupils: Pupils are equal, round, and reactive to light.     Comments: The bulbar conjunctiva is pale.  Neck:     Musculoskeletal: Normal range of motion and neck supple.     Vascular: No carotid bruit.  Cardiovascular:     Rate and Rhythm: Regular rhythm. Tachycardia present.     Pulses: Normal pulses.     Heart sounds: Normal heart  sounds. No friction rub.  Pulmonary:     Effort: No respiratory distress.     Comments: Patient has a chest tube in place on the left.  There are decreased breath sounds on the left.  Patient has pain with attempting to take a deep breath left greater than right.  Increase effort of breathing. The sutured areas from the video-assisted thoracotomy are clean without redness or drainage.  There is no redness or drainage at the chest tube site. Chest:     Chest wall: Tenderness present.  Abdominal:     General: Bowel sounds are normal.     Palpations: Abdomen is soft.     Tenderness: There is no abdominal tenderness. There is no guarding.  Musculoskeletal: Normal range of motion.     Right lower leg: No edema.     Left lower leg: No edema.  Lymphadenopathy:     Head:     Right side of head: No submandibular adenopathy.     Left side of head: No submandibular adenopathy.     Cervical: No cervical adenopathy.  Skin:    General: Skin is warm and dry.  Neurological:     Mental Status: She is alert and oriented to person, place, and time.     Cranial Nerves: No cranial nerve deficit.     Sensory: No sensory deficit.  Psychiatric:        Speech: Speech normal.      ED Treatments / Results  Labs (all labs ordered are listed, but only abnormal results are displayed) Labs Reviewed  COMPREHENSIVE METABOLIC PANEL - Abnormal; Notable for the following components:      Result Value   Sodium 133 (*)    Chloride 92 (*)    Glucose, Bld 103 (*)    BUN 25 (*)    Calcium 8.3 (*)    Total Protein 6.0 (*)    Albumin 2.6 (*)    ALT 48 (*)    Alkaline Phosphatase 242 (*)  All other components within normal limits  CBC WITH DIFFERENTIAL/PLATELET - Abnormal; Notable for the following components:   WBC 28.7 (*)    RBC 1.25 (*)    Hemoglobin 4.1 (*)    HCT 12.8 (*)    MCV 102.4 (*)    Platelets 520 (*)    Neutro Abs 23.3 (*)    Monocytes Absolute 2.1 (*)    Abs Immature Granulocytes 0.93  (*)    All other components within normal limits  CULTURE, BLOOD (ROUTINE X 2)  CULTURE, BLOOD (ROUTINE X 2)  LACTIC ACID, PLASMA  PROTIME-INR  LACTIC ACID, PLASMA  URINALYSIS, ROUTINE W REFLEX MICROSCOPIC  POC URINE PREG, ED  POC OCCULT BLOOD, ED  TYPE AND SCREEN  ABO/RH  ANTIBODY SCREEN  SAMPLE TO BLOOD BANK  PREPARE RBC (CROSSMATCH)    EKG None  Radiology Dg Chest 2 View  Result Date: 07/18/2018 CLINICAL DATA:  Patient with generalized weakness and fever. History of ovarian carcinoma. EXAM: CHEST - 2 VIEW COMPARISON:  Chest radiograph 07/16/2018 FINDINGS: Stable enlarged cardiac and mediastinal contours. Left chest tube remains in position. Persistent moderate left pleural effusion. Similar-appearing patchy consolidation to the left mid and upper lung. No definite pneumothorax. IMPRESSION: Left chest tube remains in position. No definite pneumothorax. Persistent moderate left pleural effusion with underlying consolidation. Electronically Signed   By: Lovey Newcomer M.D.   On: 07/18/2018 15:03    Procedures Procedures (including critical care time) CRITICAL CARE Performed by: Lily Kocher Total critical care time: **30* minutes Critical care time was exclusive of separately billable procedures and treating other patients. Critical care was necessary to treat or prevent imminent or life-threatening deterioration. Critical care was time spent personally by me on the following activities: development of treatment plan with patient and/or surrogate as well as nursing, discussions with consultants, evaluation of patient's response to treatment, examination of patient, obtaining history from patient or surrogate, ordering and performing treatments and interventions, ordering and review of laboratory studies, ordering and review of radiographic studies, pulse oximetry and re-evaluation of patient's condition.  Medications Ordered in ED Medications  0.9 %  sodium chloride infusion  (Manually program via Guardrails IV Fluids) (has no administration in time range)  aztreonam (AZACTAM) 2 g in sodium chloride 0.9 % 100 mL IVPB (has no administration in time range)     Initial Impression / Assessment and Plan / ED Course  I have reviewed the triage vital signs and the nursing notes.  Pertinent labs & imaging results that were available during my care of the patient were reviewed by me and considered in my medical decision making (see chart for details).          Final Clinical Impressions(s) / ED Diagnoses MDM  Temperature on admission is 99.1, heart rate is 120, respiratory rate is 22 both elevated.  Pulse oximetry initially was 88% on room air.  Patient was placed on 2 L of oxygen by nasal cannula. Pt awake and alert but ill appearing.  Received a critical result that the hemoglobin today is down to 4.1, the hematocrit is low at 12.8, the platelets are also low at 520. The white blood cell count is 28,700, there is a shift to the left with bands present. Case discussed with Dr Thurnell Garbe.  Blood transfusion requested.  Stool for occult blood to be obtained.  Chest x-ray shows a persistent moderate left pleural effusion.  There is patchy consolidation to the left mid and upper lobe that is similar to  what was seen on the July 2 film.  Vancomycin and Azactam ordered.  Case discussed with triad hospitalist.  Patient to be admitted to the hospital.     Final diagnoses:  None    ED Discharge Orders    None       Lily Kocher, PA-C 07/18/18 [place], [place], DO 07/20/18 [phone]

## 2018-07-18 NOTE — ED Notes (Signed)
Date and time results received: 07/18/18 1455 (use smartphrase ".now" to insert current time)  Test: HEMO Critical Value: 4.1  Name of Provider Notified: bryant  Orders Received? Or Actions Taken?: see chart

## 2018-07-18 NOTE — Progress Notes (Addendum)
Pharmacy Antibiotic Note  Michelle Holt is a 48 y.o. female admitted on 07/18/2018 with pneumonia.  Pharmacy has been consulted for Vancomycin dosing.  Plan: Vancomycin 1000 mg IV every 12 hours.  Goal trough 15-20 mcg/mL.  Levaquin 750 mg IV every 24 hours. Monitor labs, c/s, and patient improvement.   Height: 5\' 6"  (167.6 cm) Weight: 167 lb (75.8 kg) IBW/kg (Calculated) : 59.3  Temp (24hrs), Avg:98.8 F (37.1 C), Min:97.9 F (36.6 C), Max:99.8 F (37.7 C)  Recent Labs  Lab 07/13/18 0441 07/14/18 0424 07/15/18 0424 07/16/18 0425 07/18/18 1425 07/18/18 1539  WBC 9.5 18.0* 15.7* 11.3* 28.7*  --   CREATININE 0.48 0.58 0.66 0.69 0.91  --   LATICACIDVEN  --   --   --   --  1.2 0.8    Estimated Creatinine Clearance: 78.7 mL/min (by C-G formula based on SCr of 0.91 mg/dL).    Allergies  Allergen Reactions  . Morphine And Related Itching  . Nortriptyline Other (See Comments)    Significant weight gain  . Topamax [Topiramate] Diarrhea    nausea  . Actifed Cold-Allergy [Chlorpheniramine-Phenyleph Er] Rash  . Amoxicillin Rash    Did it involve swelling of the face/tongue/throat, SOB, or low BP? Unknown Did it involve sudden or severe rash/hives, skin peeling, or any reaction on the inside of your mouth or nose? Unknown Did you need to seek medical attention at a hospital or doctor's office? Unknown When did it last happen? teenager If all above answers are "NO", may proceed with cephalosporin use.   . Codeine Hives  . Erythromycin Rash  . Penicillins Rash    Did it involve swelling of the face/tongue/throat, SOB, or low BP? Unknown Did it involve sudden or severe rash/hives, skin peeling, or any reaction on the inside of your mouth or nose? Unknown Did you need to seek medical attention at a hospital or doctor's office? Unknown When did it last happen? teenager If all above answers are "NO", may proceed with cephalosporin use.   Michelle Holt [Pseudoephedrine  Hcl] Rash    Antimicrobials this admission: Rocephin 7/4 >>7/4 Vanco 7/4 >>  Levaquin 7/5 >> Azithromycin 7/4 >> 7/4  Dose adjustments this admission: N/A  Microbiology results: 7/4 BCx: ngtd 7//4 UCx: pending  7/4 MRSA PCR: pending  Thank you for allowing pharmacy to be a part of this patient's care.  Michelle Holt 07/18/2018 8:41 PM

## 2018-07-19 DIAGNOSIS — D649 Anemia, unspecified: Secondary | ICD-10-CM

## 2018-07-19 LAB — PREPARE FRESH FROZEN PLASMA: Unit division: 0

## 2018-07-19 LAB — BASIC METABOLIC PANEL
Anion gap: 11 (ref 5–15)
BUN: 14 mg/dL (ref 6–20)
CO2: 26 mmol/L (ref 22–32)
Calcium: 8.1 mg/dL — ABNORMAL LOW (ref 8.9–10.3)
Chloride: 97 mmol/L — ABNORMAL LOW (ref 98–111)
Creatinine, Ser: 0.67 mg/dL (ref 0.44–1.00)
GFR calc Af Amer: >60 mL/min (ref >60)
GFR calc non Af Amer: >60 mL/min (ref >60)
Glucose, Bld: 88 mg/dL (ref 70–99)
Potassium: 3.9 mmol/L (ref 3.5–5.1)
Sodium: 134 mmol/L — ABNORMAL LOW (ref 135–145)

## 2018-07-19 LAB — CBC
HCT: 29.8 % — ABNORMAL LOW (ref 36.0–46.0)
Hemoglobin: 9.9 g/dL — ABNORMAL LOW (ref 12.0–15.0)
MCH: 30.5 pg (ref 26.0–34.0)
MCHC: 33.2 g/dL (ref 30.0–36.0)
MCV: 91.7 fL (ref 80.0–100.0)
Platelets: 357 10*3/uL (ref 150–400)
RBC: 3.25 MIL/uL — ABNORMAL LOW (ref 3.87–5.11)
RDW: 15.7 % — ABNORMAL HIGH (ref 11.5–15.5)
WBC: 18.7 10*3/uL — ABNORMAL HIGH (ref 4.0–10.5)
nRBC: 1.1 % — ABNORMAL HIGH (ref 0.0–0.2)

## 2018-07-19 LAB — BPAM FFP
Blood Product Expiration Date: 202007092359
ISSUE DATE / TIME: 202007041829
Unit Type and Rh: 5100

## 2018-07-19 LAB — PROCALCITONIN: Procalcitonin: 0.14 ng/mL

## 2018-07-19 MED ORDER — HYDROMORPHONE HCL 1 MG/ML IJ SOLN
0.5000 mg | Freq: Once | INTRAMUSCULAR | Status: AC
  Administered 2018-07-19: 0.5 mg via INTRAVENOUS
  Filled 2018-07-19: qty 0.5

## 2018-07-19 NOTE — Consult Note (Signed)
Referring Provider: No ref. provider found Primary Care Physician:  [name], MD Primary Gastroenterologist:  Dr.Rehman  Reason for Consultation: Profound anemia; melena; new diagnosis ovarian cancer   HPI: Michelle Holt 48 year old lady recently diagnosed with likely metastatic ovarian cancer with malignant loculated pleural effusion/pneumonia (cytology adenocarcinoma-likely ovarian etiology).  Recent VATS, thoracentesis and talc pleurodesis with indwelling pleural drainage catheter markedly abnormal ovaries on recent CT. On 7/2/ 2020, the day of discharge in [place], she had a large black tarry bowel movement and progressive weakness.  She presented to our facility yesterday -  was found to have an H&H of 4 and 12.  Status post 4 units of packed RBCs; this morning H&H 9.9/29.8.  Admission white count 28,400; 18,700 this morning.  Alkaline phosphatase 242, ALT 48, total bilirubin 0.7.  On Levaquin and vancomycin, PPI therapy.  Takes OTC NSAIDs on a regular basis for migraine headaches. She has not had any hematochezia or hematemesis. She reports a history of peptic ulcer disease found at EGD in 2008. Colonoscopy for rectal bleeding in 2015 demonstrated only hemorrhoids.   Patient states at baseline she has reflux symptoms almost every day -  this is been a chronic symptom.  She takes a variety of OTC agents on demand.  Also, notes esophageal dysphagia to solids for months. Recently has had vague diffuse abdominal pain.  GB out. CT of the abdomen and pelvis 6/26 demonstrated a loculated pleural effusion, trace ascites and markedly abnormal ovaries bilaterally.  Liver appeared normal.  She was given a regular diet today and has consumed breakfast and lunch.     Past Medical History:  Diagnosis Date  . Anxiety and depression   . Arthritis of facet joints at multiple vertebral levels    L5-S1  . Constipation   . Dyslipidemia   . Insomnia   . Irritable bowel syndrome   .  Migraine   . Muscle tension headache   . Plantar fasciitis of right foot     Past Surgical History:  Procedure Laterality Date  . CHOLECYSTECTOMY  2008  . COLONOSCOPY N/A 08/13/2013   Procedure: COLONOSCOPY;  Surgeon: Rogene Houston, MD;  [facility];  Service: Endoscopy;  Laterality: N/A;  230-moved to 145 Ann to notify pt  . ESOPHAGOGASTRODUODENOSCOPY    . IR PERC PLEURAL DRAIN W/INDWELL CATH W/IMG GUIDE  07/08/2018  . IR THORACENTESIS ASP PLEURAL SPACE W/IMG GUIDE  07/07/2018  . PLEURAL EFFUSION DRAINAGE Left 07/13/2018   Procedure: DRAINAGE OF LOCULATED PLEURAL EFFUSION;  Surgeon: Ivin Poot, MD;  [facility];  Service: Thoracic;  Laterality: Left;  . TALC PLEURODESIS Left 07/13/2018   Procedure: Talc Pleuradesis;  Surgeon: Prescott Gum, Collier Salina, MD;  Location: [facility];  Service: Thoracic;  Laterality: Left;  . TUBAL LIGATION Bilateral   . UTERINE ABLATION    . VIDEO ASSISTED THORACOSCOPY Left 07/13/2018   Procedure: VIDEO ASSISTED THORACOSCOPY;  Surgeon: Ivin Poot, MD;  Location: [facility];  Service: Thoracic;  Laterality: Left;    Prior to Admission medications   Medication Sig Start Date End Date Taking? Authorizing Provider  benzonatate (TESSALON PERLES) 100 MG capsule Take 1 capsule (100 mg total) by mouth 3 (three) times daily as needed for cough. 07/16/18 07/16/19 Yes Donne Hazel, MD  diazepam (VALIUM) 5 MG tablet TAKE ONE TABLET BY MOUTH AT BEDTIME AS NEEDED FOR ANXIETY Patient taking differently: Take 5 mg by mouth at bedtime as needed for anxiety.  02/11/18  Yes  Kathrynn Ducking, MD  docusate sodium (COLACE) 100 MG capsule Take 1 capsule (100 mg total) by mouth 2 (two) times daily. 07/16/18 10/14/18 Yes Donne Hazel, MD  ibuprofen (ADVIL) 600 MG tablet TAKE ONE TABLET BY MOUTH EVERY 6 HOURS AS NEEDED. Patient taking differently: Take 600 mg by mouth every 6 (six) hours as needed for headache.  05/18/18  Yes Kathrynn Ducking, MD  oxyCODONE (OXY IR/ROXICODONE) 5  MG immediate release tablet Take 1-2 tablets (5-10 mg total) by mouth every 4 (four) hours as needed for severe pain. 07/16/18  Yes Donne Hazel, MD  promethazine (PHENERGAN) 25 MG tablet Take 1 tablet (25 mg total) by mouth every 6 (six) hours as needed for nausea, vomiting or refractory nausea / vomiting. 07/16/18  Yes Donne Hazel, MD  senna-docusate (SENOKOT-S) 8.6-50 MG tablet Take 1 tablet by mouth at bedtime. 07/16/18 08/15/18 Yes Donne Hazel, MD  SUMAtriptan (IMITREX) 100 MG tablet TAKE ONE TABLET BY MOUTH PRN UP to TWICE DAILY AS NEEDED. Patient taking differently: Take 100 mg by mouth 2 (two) times daily as needed for migraine.  02/11/18  Yes Kathrynn Ducking, MD  traMADol (ULTRAM) 50 MG tablet Take 50 mg by mouth every 8 (eight) hours as needed for pain. 07/03/18  Yes [provider]    Current Facility-Administered Medications  Medication Dose Route Frequency Provider Last Rate Last Dose  . 0.9 %  sodium chloride infusion (Manually program via Guardrails IV Fluids)   Intravenous Once Truett Mainland, DO      . 0.9 %  sodium chloride infusion  10 mL/hr Intravenous Once Stinson, Jacob J, DO      . diazepam (VALIUM) tablet 5 mg  5 mg Oral QHS PRN Truett Mainland, DO      . docusate sodium (COLACE) capsule 100 mg  100 mg Oral BID Truett Mainland, DO   100 mg at 07/19/18 7846  . levofloxacin (LEVAQUIN) IVPB 750 mg  750 mg Intravenous Q24H Stinson, Jacob J, DO      . oxyCODONE (Oxy IR/ROXICODONE) immediate release tablet 5-10 mg  5-10 mg Oral Q4H PRN Truett Mainland, DO   10 mg at 07/19/18 9629  . pantoprazole (PROTONIX) injection 40 mg  40 mg Intravenous Q12H Truett Mainland, DO   40 mg at 07/19/18 5284  . promethazine (PHENERGAN) tablet 25 mg  25 mg Oral Q6H PRN Truett Mainland, DO   25 mg at 07/18/18 2047  . senna-docusate (Senokot-S) tablet 1 tablet  1 tablet Oral QHS Stinson, Jacob J, DO      . vancomycin (VANCOCIN) IVPB 1000 mg/200 mL premix  1,000 mg Intravenous Q12H  Truett Mainland, DO 200 mL/hr at 07/19/18 0606 1,000 mg at 07/19/18 0606    Allergies as of 07/18/2018 - Review Complete 07/18/2018  Allergen Reaction Noted  . Morphine and related Itching 07/06/2018  . Nortriptyline Other (See Comments) 07/31/2017  . Topamax [topiramate] Diarrhea 07/31/2017  . Actifed cold-allergy [chlorpheniramine-phenyleph er] Rash 11/11/2011  . Amoxicillin Rash 11/11/2011  . Codeine Hives 11/11/2011  . Erythromycin Rash 11/11/2011  . Penicillins Rash 11/11/2011  . Sudafed [pseudoephedrine hcl] Rash 11/11/2011    Family History  Problem Relation Age of Onset  . Hypertension Mother   . Obesity Mother   . Diabetes Mother   . Peripheral vascular disease Father   . COPD Brother   . Osteoporosis Brother   . Colon cancer Neg Hx  Social History   Socioeconomic History  . Marital status: Widowed    Spouse name: Not on file  . Number of children: 2  . Years of education: 2-College  . Highest education level: Not on file  Occupational History    Employer: [employer]  Social Needs  . Financial resource strain: Not on file  . Food insecurity    Worry: Not on file    Inability: Not on file  . Transportation needs    Medical: Not on file    Non-medical: Not on file  Tobacco Use  . Smoking status: Current Every Day Smoker    Packs/day: 0.50    Years: 17.00    Pack years: 8.50    Types: Cigarettes    Last attempt to quit: 12/11/2014    Years since quitting: 3.6  . Smokeless tobacco: Never Used  Substance and Sexual Activity  . Alcohol use: Yes    Alcohol/week: 1.0 standard drinks    Types: 1 Glasses of wine per week    Comment: Drinks alcohol on occasion  . Drug use: No  . Sexual activity: Not on file  Lifestyle  . Physical activity    Days per week: Not on file    Minutes per session: Not on file  . Stress: Not on file  Relationships  . Social Herbalist on phone: Not on file    Gets together: Not on file    Attends  religious service: Not on file    Active member of club or organization: Not on file    Attends meetings of clubs or organizations: Not on file    Relationship status: Not on file  . Intimate partner violence    Fear of current or ex partner: Not on file    Emotionally abused: Not on file    Physically abused: Not on file    Forced sexual activity: Not on file  Other Topics Concern  . Not on file  Social History Narrative   Patient lives at home with her daughter.    Patient has 2 children.    Patient is Married.    Patient is right handed.    Patient has her Associates degree.       Review of Systems:  As in history of present illness  Physical Exam: Vital signs in last 24 hours: Temp:  [97.9 F (36.6 C)-99.8 F (37.7 C)] 99.4 F (37.4 C) (07/05 1115) Pulse Rate:  [83-120] 91 (07/05 1115) Resp:  [11-25] 17 (07/05 1115) BP: (91-117)/(46-66) 110/63 (07/05 0800) SpO2:  [88 %-100 %] 94 % (07/05 1115) FiO2 (%):  [28 %] 28 % (07/05 0312) Weight:  [75.8 kg] 75.8 kg (07/04 1409)   General:   Chronically ill-appearing lady who is otherwise Michelle Holt cooperative and conversant Eyes:  Sclera clear, no icterus.   Conjunctiva pink. Mouth:  No deformity or lesions, dentition normal. Heart:  Regular rate and rhythm; no murmurs, clicks, rubs,  or gallops. Abdomen: Bulky dressing left upper abdomen lateral costal margin securing pleural drainage catheter Abdomen is full.  Positive bowel sounds.  Mild diffuse tenderness.  Intake/Output from previous day: 07/04 0701 - 07/05 0700 In: 4988.1 [I.V.:10.2; Blood:2291.5; IV Piggyback:2686.5] Out: 1 [Urine:1] Intake/Output this shift: No intake/output data recorded.  Lab Results: Recent Labs    07/18/18 1425 07/19/18 0824  WBC 28.7* 18.7*  HGB 4.1* 9.9*  HCT 12.8* 29.8*  PLT 520* 357   BMET Recent Labs    07/18/18  1425 07/19/18 0824  NA 133* 134*  K 3.9 3.9  CL 92* 97*  CO2 27 26  GLUCOSE 103* 88  BUN 25* 14  CREATININE  0.91 0.67  CALCIUM 8.3* 8.1*   LFT Recent Labs    07/18/18 1425  PROT 6.0*  ALBUMIN 2.6*  AST 32  ALT 48*  ALKPHOS 242*  BILITOT 0.7   PT/INR Recent Labs    07/18/18 1425  LABPROT 14.3  INR 1.1    Studies/Results: Dg Chest 2 View  Result Date: 07/18/2018 CLINICAL DATA:  Patient with generalized weakness and fever. History of ovarian carcinoma. EXAM: CHEST - 2 VIEW COMPARISON:  Chest radiograph 07/16/2018 FINDINGS: Stable enlarged cardiac and mediastinal contours. Left chest tube remains in position. Persistent moderate left pleural effusion. Similar-appearing patchy consolidation to the left mid and upper lung. No definite pneumothorax. IMPRESSION: Left chest tube remains in position. No definite pneumothorax. Persistent moderate left pleural effusion with underlying consolidation. Electronically Signed   By: Lovey Newcomer M.D.   On: 07/18/2018 15:03    Impression: Michelle Holt, unfortunate 48 year old lady who appears to have a new diagnosis of metastatic ovarian cancer complicated by loculated left pleural effusion/pneumonia requiring VATS, thoracentesis, pleurodesis and indwelling drainage catheter placement. Now admitted with profound anemia, history of melena/Hemoccult positive stool.  History of peptic ulcer disease, NSAID use and daily GERD and dysphagia symptoms.  She needs evaluation of her upper GI tract via EGD.   She may have multiple upper GI tract issues including reflux esophagitis with partial obstruction secondary to stricture, ring, etc.  NSAID gastropathy including peptic ulcer disease and/ or metastatic foci may also be a contributing factor. Bump in LFTs nonspecific at this time.  I have offered the patient an EGD.  However, this will have to be postponed until tomorrow because she is not n.p.o.  Given pulmonary compromise and polypharmacy, this would be best done with the assistance of anesthesia.  I am making arrangements for her primary GI physician, Dr. Laural Golden, to  perform.  The risks, benefits, limitations, alternatives and imponderables have been reviewed with the patient. Potential for esophageal dilation, biopsy, etc. have also been reviewed.  Questions have been answered.  Patient is agreeable.  Continue PPI empirically.  Further recommendations to follow.            Notice:  This dictation was prepared with Dragon dictation along with smaller phrase technology. Any transcriptional errors that result from this process are unintentional and may not be corrected upon review.

## 2018-07-19 NOTE — Progress Notes (Signed)
PROGRESS NOTE    Michelle Holt  ZOX:[id number] DOB: 1970/07/29 DOA: 07/18/2018 PCP: [name], MD   Brief Narrative:  Per HPI: Michelle Holt is a 48 y.o. female with a history of anxiety depression, and insomnia.  Patient recently hospitalized due to collapsed lung with large pleural effusion on the left side.  She was admitted to the hospital and had a thoracentesis, with L VATS procedure for loculated pleural effusion.  At the time, she was being treated for pneumonia as well.  The patient began to have some abdominal pain on the day of discharge (7/2) and had a large black stool prior to leaving the hospital.  Upon leaving, the patient felt very weak tired and had difficulty getting into the house upon getting home.  Her symptoms continued to get worse over the next couple of days.  She reported another couple of small black stools but none today.  Her symptoms are worsening.  No palliating or provoking factors.  She came to the emergency department for evaluation.  Patient was admitted with symptomatic anemia with hemoglobin 4.1 and has received 4 units of PRBCs and a unit of FFP.  This was thought to be secondary to upper GI bleed and she has been started on PPI IV twice daily and GI has been consulted for evaluation.  She is also noted to have likely ovarian adenocarcinoma which will need further work-up outpatient.  There is also suspected H CAP with her recent admission to her left lung where she has pleural effusion and Pleurx catheter.  Assessment & Plan:   Principal Problem:   Symptomatic anemia Active Problems:   Pleural effusion on left   Upper GI bleeding   Adenocarcinoma (HCC)   HCAP (healthcare-associated pneumonia)   Symptomatic, severe anemia likely secondary to acute GI bleed -Continue PPI IV twice daily -Recheck hemoglobin this a.m. pending after 4 unit PRBC transfusion and 1 unit FFP -Transfuse if hemoglobin less than 7 -GI consultation for endoscopy  appreciated -We will consider transfer to telemetry if vital signs remained stable -Keep n.p.o. for now with diet advancement per GI  H CAP, left-sided with pleural effusion and Pleurx catheter present -Continue vancomycin and Levaquin -COVID testing negative -Monitor repeat CBC -Procalcitonin pending -Sputum cultures and blood cultures pending -MRSA pending -We will try to obtain culture of pleural fluid for further analysis  Likely ovarian adenocarcinoma with cystic lesions to ovaries -Ca1 25 pending -Patient has appointment in the outpatient setting with Dr. Alvy Bimler medical oncology -She will need further work-up in the outpatient setting to include pelvic ultrasound or MRI and primary OB/GYN notified   DVT prophylaxis: SCDs Code Status: Full Family Communication: None currently at bedside Disposition Plan: Admit for GI work-up of acute GI bleed.  Manage symptomatic anemia.  Treatment of H CAP.   Consultants:   GI  Procedures:   None  Antimicrobials:  Anti-infectives (From admission, onward)   Start     Dose/Rate Route Frequency Ordered Stop   07/19/18 2100  levofloxacin (LEVAQUIN) IVPB 750 mg     750 mg 100 mL/hr over 90 Minutes Intravenous Every 24 hours 07/18/18 2247 07/24/18 2059   07/19/18 0600  vancomycin (VANCOCIN) IVPB 1000 mg/200 mL premix     1,000 mg 200 mL/hr over 60 Minutes Intravenous Every 12 hours 07/18/18 1552     07/18/18 2100  azithromycin (ZITHROMAX) 500 mg in sodium chloride 0.9 % 250 mL IVPB  Status:  Discontinued     500 mg  250 mL/hr over 60 Minutes Intravenous Every 24 hours 07/18/18 2031 07/18/18 2247   07/18/18 2100  cefTRIAXone (ROCEPHIN) 1 g in sodium chloride 0.9 % 100 mL IVPB  Status:  Discontinued     1 g 200 mL/hr over 30 Minutes Intravenous Every 24 hours 07/18/18 2031 07/18/18 2247   07/18/18 1600  vancomycin (VANCOCIN) 1,500 mg in sodium chloride 0.9 % 500 mL IVPB     1,500 mg 250 mL/hr over 120 Minutes Intravenous  Once 07/18/18  1548 07/18/18 1924   07/18/18 1530  aztreonam (AZACTAM) 2 g in sodium chloride 0.9 % 100 mL IVPB     2 g 200 mL/hr over 30 Minutes Intravenous  Once 07/18/18 1527 07/18/18 1645        Subjective: Patient seen and evaluated today with no new acute complaints or concerns. No acute concerns or events noted overnight.  She is feeling very tired.  No further bowel movements noted overnight.  She has received 4 units of PRBCs and 1 unit FFP with repeat hemoglobin pending.  She did have one episode of nausea, but no vomiting.  Objective: Vitals:   07/19/18 0400 07/19/18 0500 07/19/18 0537 07/19/18 0600  BP: 92/60 103/66  109/62  Pulse: 87 98  89  Resp: 17 19  18   Temp:   99.5 F (37.5 C)   TempSrc:   Oral   SpO2: 94% 95%  94%  Weight:      Height:        Intake/Output Summary (Last 24 hours) at 07/19/2018 0701 Last data filed at 07/19/2018 0313 Gross per 24 hour  Intake 4788.13 ml  Output 1 ml  Net 4787.13 ml   Filed Weights   07/18/18 1409  Weight: 75.8 kg    Examination:  General exam: Appears calm and comfortable, appears tired Respiratory system: Clear to auscultation. Respiratory effort normal.  Currently on 2 L nasal cannula. Cardiovascular system: S1 & S2 heard, RRR. No JVD, murmurs, rubs, gallops or clicks. No pedal edema. Gastrointestinal system: Abdomen is nondistended, soft and nontender. No organomegaly or masses felt. Normal bowel sounds heard. Central nervous system: Alert and oriented. No focal neurological deficits. Extremities: Symmetric 5 x 5 power. Skin: No rashes, lesions or ulcers Psychiatry: Judgement and insight appear normal. Mood & affect appropriate.     Data Reviewed: I have personally reviewed following labs and imaging studies  CBC: Recent Labs  Lab 07/13/18 0441 07/14/18 0424 07/14/18 0438 07/15/18 0424 07/16/18 0425 07/18/18 1425  WBC 9.5 18.0*  --  15.7* 11.3* 28.7*  NEUTROABS  --   --   --   --   --  23.3*  HGB 11.4* 11.8* 12.2  10.6* 10.1* 4.1*  HCT 35.4* 35.9* 36.0 33.7* 31.8* 12.8*  MCV 100.3* 98.6  --  101.5* 101.6* 102.4*  PLT 333 397  --  386 377 629*   Basic Metabolic Panel: Recent Labs  Lab 07/13/18 0441 07/14/18 0424 07/14/18 0438 07/15/18 0424 07/16/18 0425 07/18/18 1425  NA 137 139 138 138 137 133*  K 4.0 4.2 4.3 4.5 4.1 3.9  CL 104 104  --  101 100 92*  CO2 26 25  --  27 28 27   GLUCOSE 102* 156*  --  119* 90 103*  BUN 6 <5*  --  12 8 25*  CREATININE 0.48 0.58  --  0.66 0.69 0.91  CALCIUM 8.2* 9.2  --  8.8* 8.6* 8.3*   GFR: Estimated Creatinine Clearance: 78.7 mL/min (by C-G formula  based on SCr of 0.91 mg/dL). Liver Function Tests: Recent Labs  Lab 07/12/18 1219 07/15/18 0424 07/18/18 1425  AST 57* 19 32  ALT 141* 53* 48*  ALKPHOS 322* 203* 242*  BILITOT 0.6 0.5 0.7  PROT 5.8* 5.5* 6.0*  ALBUMIN 2.2* 2.2* 2.6*   No results for input(s): LIPASE, AMYLASE in the last 168 hours. No results for input(s): AMMONIA in the last 168 hours. Coagulation Profile: Recent Labs  Lab 07/12/18 1219 07/13/18 0441 07/18/18 1425  INR 1.1 1.0 1.1   Cardiac Enzymes: No results for input(s): CKTOTAL, CKMB, CKMBINDEX, TROPONINI in the last 168 hours. BNP (last 3 results) No results for input(s): PROBNP in the last 8760 hours. HbA1C: No results for input(s): HGBA1C in the last 72 hours. CBG: Recent Labs  Lab 07/15/18 1718 07/15/18 2118 07/16/18 0630 07/16/18 1107 07/18/18 1553  GLUCAP 127* 73 68* 67* 99   Lipid Profile: No results for input(s): CHOL, HDL, LDLCALC, TRIG, CHOLHDL, LDLDIRECT in the last 72 hours. Thyroid Function Tests: No results for input(s): TSH, T4TOTAL, FREET4, T3FREE, THYROIDAB in the last 72 hours. Anemia Panel: No results for input(s): VITAMINB12, FOLATE, FERRITIN, TIBC, IRON, RETICCTPCT in the last 72 hours. Sepsis Labs: Recent Labs  Lab 07/18/18 1425 07/18/18 1539  LATICACIDVEN 1.2 0.8    Recent Results (from the past 240 hour(s))  Culture, blood  (Routine x 2)     Status: None (Preliminary result)   Collection Time: 07/18/18  2:25 PM   Specimen: BLOOD RIGHT HAND  Result Value Ref Range Status   Specimen Description   Final    BLOOD RIGHT HAND BOTTLES DRAWN AEROBIC AND ANAEROBIC   Special Requests Blood Culture adequate volume  Final   Culture   Final    NO GROWTH < 24 HOURS Performed at [facility], [address], [place], [place] [address]    Report Status PENDING  Incomplete  Culture, blood (Routine x 2)     Status: None (Preliminary result)   Collection Time: 07/18/18  2:38 PM   Specimen: Left Antecubital; Blood  Result Value Ref Range Status   Specimen Description   Final    LEFT ANTECUBITAL BOTTLES DRAWN AEROBIC AND ANAEROBIC   Special Requests   Final    Blood Culture results may not be optimal due to an excessive volume of blood received in culture bottles   Culture   Final    NO GROWTH < 24 HOURS Performed at [facility], [address] [place], [place] [address]    Report Status PENDING  Incomplete  SARS Coronavirus 2 (CEPHEID - Performed in [facility] hospital lab), Hosp Order     Status: None   Collection Time: 07/18/18  5:02 PM   Specimen: Nasopharyngeal Swab  Result Value Ref Range Status   SARS Coronavirus 2 NEGATIVE NEGATIVE Final    Comment: (NOTE) If result is NEGATIVE SARS-CoV-2 target nucleic acids are NOT DETECTED. The SARS-CoV-2 RNA is generally detectable in upper and lower  respiratory specimens during the acute phase of infection. The lowest  concentration of SARS-CoV-2 viral copies this assay can detect is 250  copies / mL. A negative result does not preclude SARS-CoV-2 infection  and should not be used as the sole basis for treatment or other  patient management decisions.  A negative result may occur with  improper specimen collection / handling, submission of specimen other  than nasopharyngeal swab, presence of viral mutation(s) within the  areas targeted by this assay, and inadequate  number of viral copies  (<250 copies / mL). A negative result must be combined with clinical  observations, patient history, and epidemiological information. If result is POSITIVE SARS-CoV-2 target nucleic acids are DETECTED. The SARS-CoV-2 RNA is generally detectable in upper and lower  respiratory specimens dur ing the acute phase of infection.  Positive  results are indicative of active infection with SARS-CoV-2.  Clinical  correlation with patient history and other diagnostic information is  necessary to determine patient infection status.  Positive results do  not rule out bacterial infection or co-infection with other viruses. If result is PRESUMPTIVE POSTIVE SARS-CoV-2 nucleic acids MAY BE PRESENT.   A presumptive positive result was obtained on the submitted specimen  and confirmed on repeat testing.  While 2019 novel coronavirus  (SARS-CoV-2) nucleic acids may be present in the submitted sample  additional confirmatory testing may be necessary for epidemiological  and / or clinical management purposes  to differentiate between  SARS-CoV-2 and other Sarbecovirus currently known to infect humans.  If clinically indicated additional testing with an alternate test  methodology [phone]) is advised. The SARS-CoV-2 RNA is generally  detectable in upper and lower respiratory sp ecimens during the acute  phase of infection. The expected result is Negative. Fact Sheet for Patients:  StrictlyIdeas.no Fact Sheet for Healthcare Providers: BankingDealers.co.za This test is not yet approved or cleared by the [place] FDA and has been authorized for detection and/or diagnosis of SARS-CoV-2 by FDA under an Emergency Use Authorization (EUA).  This EUA will remain in effect (meaning this test can be used) for the duration of the COVID-19 declaration under Section 564(b)(1) of the Act, 21 U.S.C. section 360bbb-3(b)(1), unless the  authorization is terminated or revoked sooner. Performed at [facility], [address] [place], [place] [address]   Urine Culture     Status: None (Preliminary result)   Collection Time: 07/18/18  6:03 PM   Specimen: Urine, Clean Catch  Result Value Ref Range Status   Specimen Description   Final    URINE, CLEAN CATCH Performed at [facility], [address], [place], [place] [address]    Special Requests   Final    NONE Performed at [facility], [place] [address], [place], [place] [address]    Culture PENDING  Incomplete   Report Status PENDING  Incomplete         Radiology Studies: Dg Chest 2 View  Result Date: 07/18/2018 CLINICAL DATA:  Patient with generalized weakness and fever. History of ovarian carcinoma. EXAM: CHEST - 2 VIEW COMPARISON:  Chest radiograph 07/16/2018 FINDINGS: Stable enlarged cardiac and mediastinal contours. Left chest tube remains in position. Persistent moderate left pleural effusion. Similar-appearing patchy consolidation to the left mid and upper lung. No definite pneumothorax. IMPRESSION: Left chest tube remains in position. No definite pneumothorax. Persistent moderate left pleural effusion with underlying consolidation. Electronically Signed   By: Lovey Newcomer M.D.   On: 07/18/2018 15:03        Scheduled Meds: . sodium chloride   Intravenous Once  . docusate sodium  100 mg Oral BID  . pantoprazole (PROTONIX) IV  40 mg Intravenous Q12H  . senna-docusate  1 tablet Oral QHS   Continuous Infusions: . sodium chloride    . levofloxacin (LEVAQUIN) IV    . vancomycin 1,000 mg (07/19/18 0606)     LOS: 1 day    Time spent: 30 minutes    Celine Dishman Darleen Crocker, DO [facility] Pager [phone]  If  7PM-7AM, please contact night-coverage [email or web address] Password [email or web address] 07/19/2018, 7:01 AM

## 2018-07-20 ENCOUNTER — Encounter (HOSPITAL_COMMUNITY)
Admission: EM | Disposition: A | Discharge status: Home / Self Care | Payer: Self-pay | Source: Home / Self Care | Attending: Internal Medicine

## 2018-07-20 ENCOUNTER — Encounter (HOSPITAL_COMMUNITY): Payer: Self-pay

## 2018-07-20 ENCOUNTER — Inpatient Hospital Stay (HOSPITAL_COMMUNITY): Payer: Medicaid Other | Admitting: Anesthesiology

## 2018-07-20 ENCOUNTER — Other Ambulatory Visit: Payer: Self-pay

## 2018-07-20 ENCOUNTER — Encounter: Payer: Self-pay | Admitting: Hematology and Oncology

## 2018-07-20 ENCOUNTER — Ambulatory Visit: Payer: Self-pay | Admitting: Hematology and Oncology

## 2018-07-20 DIAGNOSIS — K259 Gastric ulcer, unspecified as acute or chronic, without hemorrhage or perforation: Secondary | ICD-10-CM

## 2018-07-20 DIAGNOSIS — K222 Esophageal obstruction: Secondary | ICD-10-CM

## 2018-07-20 DIAGNOSIS — K269 Duodenal ulcer, unspecified as acute or chronic, without hemorrhage or perforation: Secondary | ICD-10-CM

## 2018-07-20 DIAGNOSIS — K210 Gastro-esophageal reflux disease with esophagitis: Secondary | ICD-10-CM

## 2018-07-20 DIAGNOSIS — K2970 Gastritis, unspecified, without bleeding: Secondary | ICD-10-CM

## 2018-07-20 DIAGNOSIS — D649 Anemia, unspecified: Secondary | ICD-10-CM

## 2018-07-20 DIAGNOSIS — K921 Melena: Secondary | ICD-10-CM

## 2018-07-20 DIAGNOSIS — K449 Diaphragmatic hernia without obstruction or gangrene: Secondary | ICD-10-CM

## 2018-07-20 HISTORY — PX: ESOPHAGOGASTRODUODENOSCOPY (EGD) WITH PROPOFOL: SHX5813

## 2018-07-20 LAB — CBC
HCT: 32.2 % — ABNORMAL LOW (ref 36.0–46.0)
Hemoglobin: 10.3 g/dL — ABNORMAL LOW (ref 12.0–15.0)
MCH: 30.5 pg (ref 26.0–34.0)
MCHC: 32 g/dL (ref 30.0–36.0)
MCV: 95.3 fL (ref 80.0–100.0)
Platelets: 346 10*3/uL (ref 150–400)
RBC: 3.38 MIL/uL — ABNORMAL LOW (ref 3.87–5.11)
RDW: 16 % — ABNORMAL HIGH (ref 11.5–15.5)
WBC: 16.3 10*3/uL — ABNORMAL HIGH (ref 4.0–10.5)
nRBC: 0.1 % (ref 0.0–0.2)

## 2018-07-20 LAB — TYPE AND SCREEN
ABO/RH(D): O POS
Antibody Screen: NEGATIVE
Unit division: 0
Unit division: 0
Unit division: 0
Unit division: 0

## 2018-07-20 LAB — BASIC METABOLIC PANEL
Anion gap: 11 (ref 5–15)
BUN: 8 mg/dL (ref 6–20)
CO2: 28 mmol/L (ref 22–32)
Calcium: 8.5 mg/dL — ABNORMAL LOW (ref 8.9–10.3)
Chloride: 95 mmol/L — ABNORMAL LOW (ref 98–111)
Creatinine, Ser: 0.65 mg/dL (ref 0.44–1.00)
GFR calc Af Amer: >60 mL/min (ref >60)
GFR calc non Af Amer: >60 mL/min (ref >60)
Glucose, Bld: 91 mg/dL (ref 70–99)
Potassium: 4 mmol/L (ref 3.5–5.1)
Sodium: 134 mmol/L — ABNORMAL LOW (ref 135–145)

## 2018-07-20 LAB — BPAM RBC
Blood Product Expiration Date: 202007172359
Blood Product Expiration Date: 202007282359
Blood Product Expiration Date: 202008052359
Blood Product Expiration Date: 202008052359
ISSUE DATE / TIME: 202007041556
ISSUE DATE / TIME: 202007041724
ISSUE DATE / TIME: 202007042348
ISSUE DATE / TIME: 202007050307
Unit Type and Rh: 5100
Unit Type and Rh: 5100
Unit Type and Rh: 5100
Unit Type and Rh: 9500

## 2018-07-20 LAB — PROCALCITONIN: Procalcitonin: <0.1 ng/mL

## 2018-07-20 LAB — URINE CULTURE

## 2018-07-20 LAB — CA 125: Cancer Antigen (CA) 125: 581 U/mL — ABNORMAL HIGH (ref 0.0–38.1)

## 2018-07-20 SURGERY — ESOPHAGOGASTRODUODENOSCOPY (EGD) WITH PROPOFOL
Anesthesia: General

## 2018-07-20 MED ORDER — SODIUM CHLORIDE 0.9 % IV SOLN: INTRAVENOUS | Status: DC

## 2018-07-20 MED ORDER — KETAMINE HCL 50 MG/5ML IJ SOSY
PREFILLED_SYRINGE | INTRAMUSCULAR | Status: AC
  Filled 2018-07-20: qty 5

## 2018-07-20 MED ORDER — LEVOFLOXACIN 750 MG PO TABS
750.0000 mg | ORAL_TABLET | Freq: Every day | ORAL | Status: DC
  Administered 2018-07-21: 750 mg via ORAL
  Filled 2018-07-20: qty 1

## 2018-07-20 MED ORDER — HYDROMORPHONE HCL 1 MG/ML IJ SOLN
0.5000 mg | Freq: Once | INTRAMUSCULAR | Status: AC
  Administered 2018-07-20: 0.5 mg via INTRAVENOUS
  Filled 2018-07-20: qty 0.5

## 2018-07-20 MED ORDER — PROMETHAZINE HCL 25 MG/ML IJ SOLN: 6.2500 mg | INTRAMUSCULAR | Status: DC | PRN

## 2018-07-20 MED ORDER — PROPOFOL 10 MG/ML IV BOLUS
INTRAVENOUS | Status: DC | PRN
  Administered 2018-07-20: 20 mg via INTRAVENOUS

## 2018-07-20 MED ORDER — KETAMINE HCL 10 MG/ML IJ SOLN
INTRAMUSCULAR | Status: DC | PRN
  Administered 2018-07-20 (×2): 10 mg via INTRAVENOUS

## 2018-07-20 MED ORDER — LACTATED RINGERS IV SOLN
INTRAVENOUS | Status: DC
  Administered 2018-07-20: 08:00:00 via INTRAVENOUS

## 2018-07-20 MED ORDER — PROPOFOL 500 MG/50ML IV EMUL
INTRAVENOUS | Status: DC | PRN
  Administered 2018-07-20: 150 ug/kg/min via INTRAVENOUS

## 2018-07-20 NOTE — Op Note (Addendum)
[facility] Patient Name: Michelle Holt Procedure Date: 07/20/2018 7:56 AM MRN: [id number] Date of Birth: 1970/06/23 Attending MD: Hildred Laser , MD CSN: [id number] Age: 48 Admit Type: Inpatient Procedure:                Upper GI endoscopy Indications:              Melena Providers:                Hildred Laser, MD, Charlsie Quest. Theda Sers RN, RN, Hinton Rao, RN Referring MD:             Heath Lark, DO Medicines:                Propofol per Anesthesia Complications:            No immediate complications. Estimated Blood Loss:     Estimated blood loss: none. Procedure:                Pre-Anesthesia Assessment:                           - Prior to the procedure, a History and Physical                            was performed, and patient medications and                            allergies were reviewed. The patient's tolerance of                            previous anesthesia was also reviewed. The risks                            and benefits of the procedure and the sedation                            options and risks were discussed with the patient.                            All questions were answered, and informed consent                            was obtained. Prior Anticoagulants: The patient has                            taken no previous anticoagulant or antiplatelet                            agents except for NSAID medication. ASA Grade                            Assessment: IV - A patient with severe systemic  disease that is a constant threat to life. After                            reviewing the risks and benefits, the patient was                            deemed in satisfactory condition to undergo the                            procedure.                           After obtaining informed consent, the endoscope was                            passed under direct vision. Throughout the      procedure, the patient's blood pressure, pulse, and                            oxygen saturations were monitored continuously. The                            [id number]-H190 ([id number]) scope was introduced through the                            mouth, and advanced to the second part of duodenum.                            The upper GI endoscopy was accomplished without                            difficulty. The patient tolerated the procedure                            well. Scope In: 8:23:07 AM Scope Out: 8:30:53 AM Total Procedure Duration: 0 hours 7 minutes 46 seconds  Findings:      The proximal esophagus and mid esophagus were normal.      Multiple mild rings were found in the distal esophagus and at the       gastroesophageal junction.      LA Grade C (one or more mucosal breaks continuous between tops of 2 or       more mucosal folds, less than 75% circumference) esophagitis with no       bleeding was found 25 to 30 cm from the incisors.      The Z-line was regular and was found 30 cm from the incisors.      A 5 cm hiatal hernia was present.      One non-bleeding cratered gastric ulcer with no stigmata of bleeding was       found in the prepyloric region of the stomach. The lesion was 3 mm in       largest dimension.      Scattered mild inflammation characterized by erosions and erythema was       found in the gastric antrum.      A few erosions without bleeding were found  in the duodenal bulb.      The second portion of the duodenum was normal. Impression:               - Normal proximal esophagus and mid esophagus.                           - Mild distal esophageal rings; dilation not                            performed because of esophagitis.                           - LA Grade C reflux esophagitis.                           - Z-line regular, 30 cm from the incisors.                           - 5 cm hiatal hernia.                           - Non-bleeding gastric ulcer with no  stigmata of                            bleeding.                           - Gastritis.                           - Duodenal erosions without bleeding.                           - Normal second portion of the duodenum.                           - No specimens collected. Moderate Sedation:      Per Anesthesia Care Recommendation:           - Return patient to hospital ward for ongoing care.                           - Mechanical soft diet today.                           - Continue present medications.                           - No aspirin, ibuprofen, naproxen, or other                            non-steroidal anti-inflammatory drugs.                           - H. Pylori serology.                           - Repeat upper endoscopy in  8 weeks. Procedure Code(s):        --- Professional ---                           [id number], Esophagogastroduodenoscopy, flexible,                            transoral; diagnostic, including collection of                            specimen(s) by brushing or washing, when performed                            (separate procedure) Diagnosis Code(s):        --- Professional ---                           K22.2, Esophageal obstruction                           K21.0, Gastro-esophageal reflux disease with                            esophagitis                           K44.9, Diaphragmatic hernia without obstruction or                            gangrene                           K25.9, Gastric ulcer, unspecified as acute or                            chronic, without hemorrhage or perforation                           K29.70, Gastritis, unspecified, without bleeding                           K26.9, Duodenal ulcer, unspecified as acute or                            chronic, without hemorrhage or perforation                           K92.1, Melena (includes Hematochezia) CPT copyright 2019 American [employer] Association. All rights reserved. The codes documented in  this report are preliminary and upon coder review may  be revised to meet current compliance requirements. Hildred Laser, MD Hildred Laser, MD 07/20/2018 8:51:55 AM This report has been signed electronically. Number of Addenda: 0

## 2018-07-20 NOTE — Progress Notes (Signed)
PROGRESS NOTE    Michelle Holt  CHE:[id number] DOB: [birth year] DOA: 07/18/2018 PCP: [name], MD   Brief Narrative:  Per HPI: Michelle Pellicano Thorntonis a 48 y.o.femalewith a history of anxiety depression, and insomnia. Patient recently hospitalized due to collapsed lung with large pleural effusion on the left side. She was admitted to the hospital and had a thoracentesis, with L VATS procedure for loculated pleural effusion. At the time, she was being treated for pneumonia as well. The patient began to have some abdominal pain on the day of discharge (7/2) and had a large black stool prior to leaving the hospital. Upon leaving, the patient felt very weak tired and had difficulty getting into the house upon getting home. Her symptoms continued to get worse over the next couple of days. She reported another couple of small black stools but none today. Her symptoms are worsening. No palliating or provoking factors. She came to the emergency department for evaluation.  Patient was admitted with symptomatic anemia with hemoglobin 4.1 and has received 4 units of PRBCs and a unit of FFP.  This was thought to be secondary to upper GI bleed and she has been started on PPI IV twice daily and GI has been consulted for evaluation.  She is also noted to have likely ovarian adenocarcinoma which will need further work-up outpatient.  There is also suspected H CAP with her recent admission to her left lung where she has pleural effusion and Pleurx catheter.   Assessment & Plan:   Principal Problem:   Symptomatic anemia Active Problems:   Pleural effusion on left   Upper GI bleeding   Ovarian cancer, bilateral (HCC)   HCAP (healthcare-associated pneumonia)  Symptomatic, severe anemia likely secondary to acute GI bleed from ulcerative esophagitis -Continue PPI IV twice daily -Recheck CBC in a.m. which has remained stable -Transfuse if hemoglobin less than 7 -GI consultation appreciated and  patient started on diet   H CAP, left-sided with pleural effusion and Pleurx catheter present -Discontinue IV vancomycin and Levaquin and start oral Levaquin -COVID testing negative -Monitor repeat CBC -Procalcitonin noted to be low -Sputum cultures and blood cultures pending with no growth in last 2 days -MRSA pending -We will try to obtain culture of pleural fluid for further analysis, unfortunately this was collected last night and discarded  Likely ovarian adenocarcinoma with cystic lesions to ovaries -Ca1 25 pending -Discussed with Dr. Alvy Bimler and it was determined that since patient lives in need and it would be more convenient for her to follow-up with Dr. Delton Coombes at [facility] and this will be scheduled for her. -She will need further work-up in the outpatient setting to include pelvic ultrasound or MRI and primary OB/GYN notified on admission, Dr. Elonda Husky   DVT prophylaxis: SCDs Code Status: Full Family Communication: None currently at bedside Disposition Plan:  Continue to follow GI bleed and appreciate GI recommendations.  Anticipate discharge in the next 24 to 48 hours once stable.  Start oral Levaquin   Consultants:   GI  Procedures:   None  Antimicrobials:  Anti-infectives (From admission, onward)   Start     Dose/Rate Route Frequency Ordered Stop   07/19/18 2100  levofloxacin (LEVAQUIN) IVPB 750 mg     750 mg 100 mL/hr over 90 Minutes Intravenous Every 24 hours 07/18/18 2247 07/24/18 2059   07/19/18 0600  vancomycin (VANCOCIN) IVPB 1000 mg/200 mL premix     1,000 mg 200 mL/hr over 60 Minutes Intravenous Every 12 hours  07/18/18 1552     07/18/18 2100  azithromycin (ZITHROMAX) 500 mg in sodium chloride 0.9 % 250 mL IVPB  Status:  Discontinued     500 mg 250 mL/hr over 60 Minutes Intravenous Every 24 hours 07/18/18 2031 07/18/18 2247   07/18/18 2100  cefTRIAXone (ROCEPHIN) 1 g in sodium chloride 0.9 % 100 mL IVPB  Status:  Discontinued     1 g 200  mL/hr over 30 Minutes Intravenous Every 24 hours 07/18/18 2031 07/18/18 2247   07/18/18 1600  vancomycin (VANCOCIN) 1,500 mg in sodium chloride 0.9 % 500 mL IVPB     1,500 mg 250 mL/hr over 120 Minutes Intravenous  Once 07/18/18 1548 07/18/18 1924   07/18/18 1530  aztreonam (AZACTAM) 2 g in sodium chloride 0.9 % 100 mL IVPB     2 g 200 mL/hr over 30 Minutes Intravenous  Once 07/18/18 1527 07/18/18 1645       Subjective: Patient seen and evaluated today with no new acute complaints or concerns. No acute concerns or events noted overnight.  She is feeling somewhat somnolent after her EGD this morning and denies any new complaints.  Objective: Vitals:   07/20/18 0900 07/20/18 0915 07/20/18 1037 07/20/18 1432  BP: (!) 100/49 99/67 (!) 100/54 (!) 100/52  Pulse: 87 92 87 93  Resp: 15 17 19 18   Temp:    99.5 F (37.5 C)  TempSrc:    Oral  SpO2: 94% 95% (!) 86% 90%  Weight:      Height:        Intake/Output Summary (Last 24 hours) at 07/20/2018 1437 Last data filed at 07/20/2018 0933 Gross per 24 hour  Intake 320 ml  Output --  Net 320 ml   Filed Weights   07/18/18 1409  Weight: 75.8 kg    Examination:  General exam: Appears calm and comfortable, somnolent Respiratory system: Clear to auscultation. Respiratory effort normal. Cardiovascular system: S1 & S2 heard, RRR. No JVD, murmurs, rubs, gallops or clicks. No pedal edema. Gastrointestinal system: Abdomen is nondistended, soft and nontender. No organomegaly or masses felt. Normal bowel sounds heard. Central nervous system: Alert and oriented. No focal neurological deficits. Extremities: Symmetric 5 x 5 power. Skin: No rashes, lesions or ulcers Psychiatry: Judgement and insight appear normal. Mood & affect appropriate.     Data Reviewed: I have personally reviewed following labs and imaging studies  CBC: Recent Labs  Lab 07/15/18 0424 07/16/18 0425 07/18/18 1425 07/19/18 0824 07/20/18 0529  WBC 15.7* 11.3* 28.7*  18.7* 16.3*  NEUTROABS  --   --  23.3*  --   --   HGB 10.6* 10.1* 4.1* 9.9* 10.3*  HCT 33.7* 31.8* 12.8* 29.8* 32.2*  MCV 101.5* 101.6* 102.4* 91.7 95.3  PLT 386 377 520* 357 272   Basic Metabolic Panel: Recent Labs  Lab 07/15/18 0424 07/16/18 0425 07/18/18 1425 07/19/18 0824 07/20/18 0529  NA 138 137 133* 134* 134*  K 4.5 4.1 3.9 3.9 4.0  CL 101 100 92* 97* 95*  CO2 27 28 27 26 28   GLUCOSE 119* 90 103* 88 91  BUN 12 8 25* 14 8  CREATININE 0.66 0.69 0.91 0.67 0.65  CALCIUM 8.8* 8.6* 8.3* 8.1* 8.5*   GFR: Estimated Creatinine Clearance: 89.5 mL/min (by C-G formula based on SCr of 0.65 mg/dL). Liver Function Tests: Recent Labs  Lab 07/15/18 0424 07/18/18 1425  AST 19 32  ALT 53* 48*  ALKPHOS 203* 242*  BILITOT 0.5 0.7  PROT 5.5* 6.0*  ALBUMIN 2.2* 2.6*   No results for input(s): LIPASE, AMYLASE in the last 168 hours. No results for input(s): AMMONIA in the last 168 hours. Coagulation Profile: Recent Labs  Lab 07/18/18 1425  INR 1.1   Cardiac Enzymes: No results for input(s): CKTOTAL, CKMB, CKMBINDEX, TROPONINI in the last 168 hours. BNP (last 3 results) No results for input(s): PROBNP in the last 8760 hours. HbA1C: No results for input(s): HGBA1C in the last 72 hours. CBG: Recent Labs  Lab 07/15/18 1718 07/15/18 2118 07/16/18 0630 07/16/18 1107 07/18/18 1553  GLUCAP 127* 73 68* 67* 99   Lipid Profile: No results for input(s): CHOL, HDL, LDLCALC, TRIG, CHOLHDL, LDLDIRECT in the last 72 hours. Thyroid Function Tests: No results for input(s): TSH, T4TOTAL, FREET4, T3FREE, THYROIDAB in the last 72 hours. Anemia Panel: No results for input(s): VITAMINB12, FOLATE, FERRITIN, TIBC, IRON, RETICCTPCT in the last 72 hours. Sepsis Labs: Recent Labs  Lab 07/18/18 1425 07/18/18 1539 07/19/18 0824 07/20/18 0529  PROCALCITON  --   --  0.14 <0.10  LATICACIDVEN 1.2 0.8  --   --     Recent Results (from the past 240 hour(s))  Culture, blood (Routine x 2)      Status: None (Preliminary result)   Collection Time: 07/18/18  2:25 PM   Specimen: BLOOD RIGHT HAND  Result Value Ref Range Status   Specimen Description   Final    BLOOD RIGHT HAND BOTTLES DRAWN AEROBIC AND ANAEROBIC   Special Requests Blood Culture adequate volume  Final   Culture   Final    NO GROWTH 2 DAYS Performed at [facility], [address] [place], [place] [address]    Report Status PENDING  Incomplete  Culture, blood (Routine x 2)     Status: None (Preliminary result)   Collection Time: 07/18/18  2:38 PM   Specimen: Left Antecubital; Blood  Result Value Ref Range Status   Specimen Description   Final    LEFT ANTECUBITAL BOTTLES DRAWN AEROBIC AND ANAEROBIC   Special Requests   Final    Blood Culture results may not be optimal due to an excessive volume of blood received in culture bottles   Culture   Final    NO GROWTH 2 DAYS Performed at [facility], [place], [address] [place], [place] [address]    Report Status PENDING  Incomplete  SARS Coronavirus 2 (CEPHEID - Performed in [place] hospital lab), Hosp Order     Status: None   Collection Time: 07/18/18  5:02 PM   Specimen: Nasopharyngeal Swab  Result Value Ref Range Status   SARS Coronavirus 2 NEGATIVE NEGATIVE Final    Comment: (NOTE) If result is NEGATIVE SARS-CoV-2 target nucleic acids are NOT DETECTED. The SARS-CoV-2 RNA is generally detectable in upper and lower  respiratory specimens during the acute phase of infection. The lowest  concentration of SARS-CoV-2 viral copies this assay can detect is 250  copies / mL. A negative result does not preclude SARS-CoV-2 infection  and should not be used as the sole basis for treatment or other  patient management decisions.  A negative result may occur with  improper specimen collection / handling, submission of specimen other  than nasopharyngeal swab, presence of viral mutation(s) within the  areas targeted by this assay, and inadequate number of viral copies    (<250 copies / mL). A negative result must be combined with clinical  observations, patient history, and epidemiological information. If result is POSITIVE SARS-CoV-2 target nucleic acids are  DETECTED. The SARS-CoV-2 RNA is generally detectable in upper and lower  respiratory specimens dur ing the acute phase of infection.  Positive  results are indicative of active infection with SARS-CoV-2.  Clinical  correlation with patient history and other diagnostic information is  necessary to determine patient infection status.  Positive results do  not rule out bacterial infection or co-infection with other viruses. If result is PRESUMPTIVE POSTIVE SARS-CoV-2 nucleic acids MAY BE PRESENT.   A presumptive positive result was obtained on the submitted specimen  and confirmed on repeat testing.  While 2019 novel coronavirus  (SARS-CoV-2) nucleic acids may be present in the submitted sample  additional confirmatory testing may be necessary for epidemiological  and / or clinical management purposes  to differentiate between  SARS-CoV-2 and other Sarbecovirus currently known to infect humans.  If clinically indicated additional testing with an alternate test  methodology [phone]) is advised. The SARS-CoV-2 RNA is generally  detectable in upper and lower respiratory sp ecimens during the acute  phase of infection. The expected result is Negative. Fact Sheet for Patients:  StrictlyIdeas.no Fact Sheet for Healthcare Providers: BankingDealers.co.za This test is not yet approved or cleared by the [place] FDA and has been authorized for detection and/or diagnosis of SARS-CoV-2 by FDA under an Emergency Use Authorization (EUA).  This EUA will remain in effect (meaning this test can be used) for the duration of the COVID-19 declaration under Section 564(b)(1) of the Act, 21 U.S.C. section 360bbb-3(b)(1), unless the authorization is terminated  or revoked sooner. Performed at [facility], [address] [place], [place] [address]   Urine Culture     Status: None   Collection Time: 07/18/18  6:03 PM   Specimen: Urine, Clean Catch  Result Value Ref Range Status   Specimen Description   Final    URINE, CLEAN CATCH Performed at [facility], [address], [place], [place] [address]    Special Requests   Final    NONE Performed at [facility], [place] [address], [place],  [address]    Culture   Final    Multiple bacterial morphotypes present, none predominant. Suggest appropriate recollection if clinically indicated.   Report Status 07/20/2018 FINAL  Final         Radiology Studies: Dg Chest 2 View  Result Date: 07/18/2018 CLINICAL DATA:  Patient with generalized weakness and fever. History of ovarian carcinoma. EXAM: CHEST - 2 VIEW COMPARISON:  Chest radiograph 07/16/2018 FINDINGS: Stable enlarged cardiac and mediastinal contours. Left chest tube remains in position. Persistent moderate left pleural effusion. Similar-appearing patchy consolidation to the left mid and upper lung. No definite pneumothorax. IMPRESSION: Left chest tube remains in position. No definite pneumothorax. Persistent moderate left pleural effusion with underlying consolidation. Electronically Signed   By: Lovey Newcomer M.D.   On: 07/18/2018 15:03        Scheduled Meds:  sodium chloride   Intravenous Once   docusate sodium  100 mg Oral BID   pantoprazole (PROTONIX) IV  40 mg Intravenous Q12H   senna-docusate  1 tablet Oral QHS   Continuous Infusions:  sodium chloride     levofloxacin (LEVAQUIN) IV 750 mg (07/19/18 2246)   vancomycin 1,000 mg (07/20/18 0545)     LOS: 2 days    Time spent: 30 minutes    Lener Ventresca Darleen Crocker, DO [facility] Pager [phone]  If 7PM-7AM, please contact night-coverage [email or web address] Password [email or web address] 07/20/2018, 2:37 PM

## 2018-07-20 NOTE — Progress Notes (Signed)
  Subjective:  Patient states she has not had a bowel movement in 24 hours.  She complains of epigastric pain that she also has been experiencing intermittent dysphagia to solids for the last few weeks.  She has had few episodes of food impaction.  No history of hematemesis or rectal bleeding.  She also complains of left shoulder pain but denies shortness of breath.  Objective: Blood pressure 105/61, pulse 83, temperature 98.8 F (37.1 C), temperature source Oral, resp. rate 16, height _0  (1.676 m), weight 75.8 kg, SpO2 90 %. Patient is alert and in no acute distress. She does not appear to be pale. She has left pleural drainage catheter in place. Breath sounds are vesicular bilaterally. Cardiac exam with regular rhythm normal S1 and S2. Abdomen is full but soft with mild midepigastric tenderness.  No organomegaly or masses.  Labs/studies Results:  CBC Latest Ref Rng & Units 07/20/2018 07/19/2018 07/18/2018  WBC 4.0 - 10.5 K/uL 16.3(H) 18.7(H) 28.7(H)  Hemoglobin 12.0 - 15.0 g/dL 10.3(L) 9.9(L) 4.1(LL)  Hematocrit 36.0 - 46.0 % 32.2(L) 29.8(L) 12.8(L)  Platelets 150 - 400 K/uL 346 357 520(H)    CMP Latest Ref Rng & Units 07/20/2018 07/19/2018 07/18/2018  Glucose 70 - 99 mg/dL 91 88 103(H)  BUN 6 - 20 mg/dL 8 14 25(H)  Creatinine 0.44 - 1.00 mg/dL 0.65 0.67 0.91  Sodium 135 - 145 mmol/L 134(L) 134(L) 133(L)  Potassium 3.5 - 5.1 mmol/L 4.0 3.9 3.9  Chloride 98 - 111 mmol/L 95(L) 97(L) 92(L)  CO2 22 - 32 mmol/L _1 Calcium 8.9 - 10.3 mg/dL 8.5(L) 8.1(L) 8.3(L)  Total Protein 6.5 - 8.1 g/dL - - 6.0(L)  Total Bilirubin 0.3 - 1.2 mg/dL - - 0.7  Alkaline Phos 38 - 126 U/L - - 242(H)  AST 15 - 41 U/L - - 32  ALT 0 - 44 U/L - - 48(H)    Hepatic Function Latest Ref Rng & Units 07/18/2018 07/15/2018 07/12/2018  Total Protein 6.5 - 8.1 g/dL 6.0(L) 5.5(L) 5.8(L)  Albumin 3.5 - 5.0 g/dL 2.6(L) 2.2(L) 2.2(L)  AST 15 - 41 U/L 32 19 57(H)  ALT 0 - 44 U/L 48(H) 53(H) 141(H)  Alk Phosphatase 38 - 126  U/L 242(H) 203(H) 322(H)  Total Bilirubin 0.3 - 1.2 mg/dL 0.7 0.5 0.6  Bilirubin, Direct 0.0 - 0.2 mg/dL - - -     Assessment:  #1.  Four unit upper GI bleed.  Patient presented with melena epigastric pain and hemoglobin of 4 g.  She has received 4 units of PRBCs.  Hemoglobin is up to 1.3 g.  She also has dysphagia.  Differential diagnosis includes peptic ulcer disease or reflux esophagitis.  Unless there is obvious contraindication esophageal dilation would be undertaken if indicated.  #2.  Anemia secondary to GI bleed.  #3.  Bilateral pleural effusions felt to be due to metastatic disease.  Patient is status post recent VATS and talc pleurodesis on the left side and still has pleural drainage catheter.   Plan:  Esophagogastroduodenoscopy with therapeutic intervention under MAC. Patient's questions answered.  She is agreeable to proceed with procedure.

## 2018-07-20 NOTE — Anesthesia Preprocedure Evaluation (Signed)
Anesthesia Evaluation  Patient identified by MRN, date of birth, ID band Patient awake    Reviewed: Allergy & Precautions, NPO status , Patient's Chart, lab work & pertinent test results  Airway Mallampati: II  TM Distance: >3 FB Neck ROM: Full    Dental no notable dental hx. (+) Teeth Intact   Pulmonary pneumonia, resolved, Current Smoker,    Pulmonary exam normal breath sounds clear to auscultation       Cardiovascular Exercise Tolerance: Good negative cardio ROS Normal cardiovascular examI Rhythm:Regular Rate:Normal     Neuro/Psych  Headaches, PSYCHIATRIC DISORDERS Anxiety Depression    GI/Hepatic negative GI ROS, Neg liver ROS, Probable UGI bleed -here for EGD   Endo/Other  negative endocrine ROS  Renal/GU negative Renal ROS  negative genitourinary   Musculoskeletal  (+) Arthritis , Osteoarthritis,    Abdominal   Peds negative pediatric ROS (+)  Hematology negative hematology ROS (+) anemia , Received 4 units - last ~10/30  Has L sided Chest Pigtail cath -states drained yesterday -Malignant pleural eff ?-S/P VATS -07/13/18   Anesthesia Other Findings   Reproductive/Obstetrics negative OB ROS                             Anesthesia Physical Anesthesia Plan  ASA: IV  Anesthesia Plan: General   Post-op Pain Management:    Induction: Intravenous  PONV Risk Score and Plan: 2 and Ondansetron, Treatment may vary due to age or medical condition, TIVA and Propofol infusion  Airway Management Planned: Nasal Cannula and Simple Face Mask  Additional Equipment:   Intra-op Plan:   Post-operative Plan: Extubation in OR  Informed Consent: I have reviewed the patients History and Physical, chart, labs and discussed the procedure including the risks, benefits and alternatives for the proposed anesthesia with the patient or authorized representative who has indicated his/her understanding  and acceptance.     Dental advisory given  Plan Discussed with: CRNA  Anesthesia Plan Comments: (Plan Full PPE Plan GA as tolerated -poss GETA D/w pt unlikely postop ventilation -voiced understanding WTP)        Anesthesia Quick Evaluation

## 2018-07-20 NOTE — Anesthesia Procedure Notes (Addendum)
Procedure Name: General with mask airway Performed by: Andree Elk Rik Wadel A, CRNA Pre-anesthesia Checklist: Patient identified, Emergency Drugs available, Suction available, Timeout performed and Patient being monitored Patient Re-evaluated:Patient Re-evaluated prior to induction Oxygen Delivery Method: Non-rebreather mask

## 2018-07-20 NOTE — Anesthesia Postprocedure Evaluation (Signed)
Anesthesia Post Note  Patient: Michelle Holt  Procedure(s) Performed: ESOPHAGOGASTRODUODENOSCOPY (EGD) WITH PROPOFOL (N/A )  Patient location during evaluation: PACU Anesthesia Type: General Level of consciousness: awake, oriented and patient cooperative Pain management: pain level controlled Vital Signs Assessment: post-procedure vital signs reviewed and stable Respiratory status: spontaneous breathing Cardiovascular status: stable Postop Assessment: no apparent nausea or vomiting Anesthetic complications: no     Last Vitals:  Vitals:   07/20/18 0747 07/20/18 0839  BP: 105/61   Pulse: 83   Resp: 16   Temp: 37.1 C (P) 37.2 C  SpO2: 90% (P) 95%    Last Pain:  Vitals:   07/20/18 0817  TempSrc:   PainSc: 6                  ADAMS, AMY A

## 2018-07-20 NOTE — Progress Notes (Signed)
Patient c/o severe abdominal/mid sternal chest pain earlier in shift.  Attempted to drain pleur-vac.  No drainage returned. Pain still c/o pain.  Pain medication given and heat packs applied. Will continue to monitor patient.

## 2018-07-20 NOTE — Progress Notes (Signed)
Endoscopy staff present to transport patient for EGD.

## 2018-07-20 NOTE — Progress Notes (Signed)
Brief EGD note  Grade C esophagitis; long linear erosions involving distal 5 cm of esophageal mucosa. Noncritical distal esophageal rings including one at GE junction. 5 cm size sliding hiatal hernia. Erosive gastroduodenitis. 3 mm deep prepyloric ulcer with clean base.  Esophagus not dilated because of esophagitis.

## 2018-07-20 NOTE — Transfer of Care (Signed)
Immediate Anesthesia Transfer of Care Note  Patient: Michelle Holt  Procedure(s) Performed: ESOPHAGOGASTRODUODENOSCOPY (EGD) WITH PROPOFOL (N/A )  Patient Location: PACU  Anesthesia Type:General  Level of Consciousness: awake, oriented and patient cooperative  Airway & Oxygen Therapy: Patient Spontanous Breathing and Patient connected to nasal cannula oxygen  Post-op Assessment: Report given to RN and Post -op Vital signs reviewed and stable  Post vital signs: Reviewed and stable  Last Vitals:  Vitals Value Taken Time  BP 93/60 07/20/18 0839  Temp    Pulse 94 07/20/18 0842  Resp 14 07/20/18 0842  SpO2 95 % 07/20/18 0842  Vitals shown include unvalidated device data.  Last Pain:  Vitals:   07/20/18 0817  TempSrc:   PainSc: 6       Patients Stated Pain Goal: 0 (16/10/96 0454)  Complications: No apparent anesthesia complications

## 2018-07-21 LAB — HIV ANTIBODY (ROUTINE TESTING W REFLEX): HIV Screen 4th Generation wRfx: NONREACTIVE

## 2018-07-21 LAB — BASIC METABOLIC PANEL
Anion gap: 10 (ref 5–15)
BUN: 8 mg/dL (ref 6–20)
CO2: 26 mmol/L (ref 22–32)
Calcium: 8.3 mg/dL — ABNORMAL LOW (ref 8.9–10.3)
Chloride: 99 mmol/L (ref 98–111)
Creatinine, Ser: 0.65 mg/dL (ref 0.44–1.00)
GFR calc Af Amer: >60 mL/min (ref >60)
GFR calc non Af Amer: >60 mL/min (ref >60)
Glucose, Bld: 116 mg/dL — ABNORMAL HIGH (ref 70–99)
Potassium: 3.6 mmol/L (ref 3.5–5.1)
Sodium: 135 mmol/L (ref 135–145)

## 2018-07-21 LAB — H. PYLORI ANTIBODY, IGG: H Pylori IgG: 0.3 Index Value (ref 0.00–0.79)

## 2018-07-21 LAB — CBC
HCT: 32.3 % — ABNORMAL LOW (ref 36.0–46.0)
Hemoglobin: 10.4 g/dL — ABNORMAL LOW (ref 12.0–15.0)
MCH: 31 pg (ref 26.0–34.0)
MCHC: 32.2 g/dL (ref 30.0–36.0)
MCV: 96.1 fL (ref 80.0–100.0)
Platelets: 312 10*3/uL (ref 150–400)
RBC: 3.36 MIL/uL — ABNORMAL LOW (ref 3.87–5.11)
RDW: 15.8 % — ABNORMAL HIGH (ref 11.5–15.5)
WBC: 14.1 10*3/uL — ABNORMAL HIGH (ref 4.0–10.5)
nRBC: 0 % (ref 0.0–0.2)

## 2018-07-21 MED ORDER — PANTOPRAZOLE SODIUM 40 MG PO TBEC: 40.0000 mg | DELAYED_RELEASE_TABLET | Freq: Two times a day (BID) | ORAL | 2 refills | Status: DC

## 2018-07-21 MED ORDER — PANTOPRAZOLE SODIUM 40 MG PO TBEC: 40.0000 mg | DELAYED_RELEASE_TABLET | Freq: Two times a day (BID) | ORAL | Status: DC

## 2018-07-21 MED ORDER — OXYCODONE HCL 5 MG PO TABS: 5.0000 mg | ORAL_TABLET | ORAL | 0 refills | Status: DC | PRN

## 2018-07-21 MED ORDER — LEVOFLOXACIN 750 MG PO TABS: 750.0000 mg | ORAL_TABLET | Freq: Every day | ORAL | 0 refills | Status: DC

## 2018-07-21 NOTE — Progress Notes (Signed)
Patient taken to outpatient entrance at discharge via wheelchair.  Discharged to home POV with daughter.

## 2018-07-21 NOTE — Clinical Social Work Note (Signed)
Patient's RN, [place], provided with a box of drain supplies for patient to take home.  Patient advised that she would be given the supplies. [facility] aware of discharge.    Parminder Cupples, Clydene Pugh, LCSW

## 2018-07-21 NOTE — Progress Notes (Signed)
Subjective:  Patient feels better.  She is having less epigastric pain.  She has not had a bowel movement in over 24 hours.  She is passing flatus.  She is still having some difficulty swallowing but she did not have prolonged episode.  She continues to complain of pain at left shoulder and left pectoral region.  Pain is more pronounced when she takes a deep breath.  Objective: Blood pressure (!) 104/59, pulse 88, temperature 98.7 F (37.1 C), temperature source Oral, resp. rate 15, height '5\' 6"'  (1.676 m), weight 75.8 kg, SpO2 92 %. Patient is alert and in no acute distress. Conjunctiva is pink. Sclera is nonicteric Oropharyngeal mucosa is normal. No neck masses or thyromegaly noted. He has left pleural catheter in place. Cardiac exam with regular rhythm normal S1 and S2. No murmur or gallop noted. Lungs are clear to auscultation. Abdomen.  Bowel sounds are normal.  She has mild midepigastric tenderness. No LE edema or clubbing noted.  Labs/studies Results:  CBC Latest Ref Rng & Units 07/21/2018 07/20/2018 07/19/2018  WBC 4.0 - 10.5 K/uL 14.1(H) 16.3(H) 18.7(H)  Hemoglobin 12.0 - 15.0 g/dL 10.4(L) 10.3(L) 9.9(L)  Hematocrit 36.0 - 46.0 % 32.3(L) 32.2(L) 29.8(L)  Platelets 150 - 400 K/uL 312 346 357    CMP Latest Ref Rng & Units 07/21/2018 07/20/2018 07/19/2018  Glucose 70 - 99 mg/dL 116(H) 91 88  BUN 6 - 20 mg/dL '8 8 14  ' Creatinine 0.44 - 1.00 mg/dL 0.65 0.65 0.67  Sodium 135 - 145 mmol/L 135 134(L) 134(L)  Potassium 3.5 - 5.1 mmol/L 3.6 4.0 3.9  Chloride 98 - 111 mmol/L 99 95(L) 97(L)  CO2 22 - 32 mmol/L '26 28 26  ' Calcium 8.9 - 10.3 mg/dL 8.3(L) 8.5(L) 8.1(L)  Total Protein 6.5 - 8.1 g/dL - - -  Total Bilirubin 0.3 - 1.2 mg/dL - - -  Alkaline Phos 38 - 126 U/L - - -  AST 15 - 41 U/L - - -  ALT 0 - 44 U/L - - -    Hepatic Function Latest Ref Rng & Units 07/18/2018 07/15/2018 07/12/2018  Total Protein 6.5 - 8.1 g/dL 6.0(L) 5.5(L) 5.8(L)  Albumin 3.5 - 5.0 g/dL 2.6(L) 2.2(L) 2.2(L)  AST 15  - 41 U/L 32 19 57(H)  ALT 0 - 44 U/L 48(H) 53(H) 141(H)  Alk Phosphatase 38 - 126 U/L 242(H) 203(H) 322(H)  Total Bilirubin 0.3 - 1.2 mg/dL 0.7 0.5 0.6  Bilirubin, Direct 0.0 - 0.2 mg/dL - - -    H pylori serology pending.  Assessment:  #1.  Upper GI bleed secondary to grade C reflux esophagitis.  She has received 4 units of PRBCs.  She underwent EGD yesterday and did not require therapeutic intervention.  No evidence of recurrent bleed.  #2.  Peptic ulcer disease.  EGD yesterday revealed 3 mm prepyloric ulcer with clean base as well as gastroduodenitis without stigmata of bleeding.  H. pylori serology is pending.  #3.  Anemia.  She presented with profound anemia with hemoglobin of 4 g.  He has received 4 units of PRBCs and hemoglobin is 10.4 g.  #4.  Facial dysphagia secondary to esophagitis and distal esophageal rings.  Esophageal dilation deferred due to ongoing inflammation.  #5.  Left shoulder and chest pain appears to be recent chemical pleurodesis and indwelling pleural catheter.  #6.  Bilateral ovarian masses concerning for malignancy.  Oncology and gynecology consultations are pending.  Patient is hoping to be seen by Dr. Delton Coombes during this  hospitalization.   Recommendations:  Change pantoprazole to oral route at 40 mg p.o. twice daily. Patient advised not to use OTC NSAIDs. She will need to remain on mechanical soft diet until her esophagus can be dilated. We will plan EGD with dilation and out 6 weeks.

## 2018-07-21 NOTE — Discharge Summary (Signed)
Physician Discharge Summary  Michelle Holt KDT:[id number] DOB: 1971/01/12 DOA: 07/18/2018  PCP: Michelle Chroman, MD  Admit date: 07/18/2018  Discharge date: 07/21/2018  Admitted From:Home  Disposition:  Home  Recommendations for Outpatient Follow-up:  1. Follow up with PCP in 1-2 weeks 2. Patient given refill on oxycodone IR as prescribed below for 20 tablets and will need further refills per CT surgery and/or PCP 3. Follow-up with Dr. Delton Holt as noted on 7/8 at 3:35 PM for assessment of bilateral ovarian masses concerning for malignancy 4. Continue Protonix 40 mg twice daily as prescribed and avoid NSAIDs 5. Finish course of Levaquin with 1 more day prescribed 6. Follow-up with GI MichelleRehman in 6 weeks for EGD with dilation  Home Health: Yes with RN  Equipment/Devices: None  Discharge Condition: Stable  CODE STATUS: Full  Diet recommendation: Heart Healthy  Brief/Interim Summary: Per HPI: Michelle Holt Thorntonis a 48 y.o.femalewith a history of anxiety depression, and insomnia. Patient recently hospitalized due to collapsed lung with large pleural effusion on the left side. She was admitted to the hospital and had a thoracentesis, with L VATS procedure for loculated pleural effusion. At the time, she was being treated for pneumonia as well. The patient began to have some abdominal pain on the day of discharge (7/2) and had a large black stool prior to leaving the hospital. Upon leaving, the patient felt very weak tired and had difficulty getting into the house upon getting home. Her symptoms continued to get worse over the next couple of days. She reported another couple of small black stools but none today. Her symptoms are worsening. No palliating or provoking factors. She came to the emergency department for evaluation.  Patient was admitted with symptomatic anemia with hemoglobin 4.1 and has received 4 units of PRBCs and a unit of FFP.  Her hemoglobin and hematocrit have  stabilized and she has had no further symptomatology related to this.  She underwent upper endoscopy on 7/6 with noted upper GI bleed that was likely secondary to grade C reflux esophagitis.  She was also noted to have peptic ulcer disease with H. pylori serology pending.  Additionally, she has prepyloric ulcer as well as gastroduodenitis and there was no noted stigmata of bleeding at that time.  She will remain on Protonix twice daily and follow-up with GI in the next 6 weeks for repeat EGD with dilation at that time.  She is to avoid NSAIDs.  She is also noted to have bilateral ovarian masses that are very concerning for malignancy and Ca1 25 is elevated.  Have spoken with Dr. Delton Holt who will follow up with this patient as noted above.  Patient will also reschedule her appointment with Michelle Holt for evaluation of her Pleurx catheter.  She continues to have some pain in this area related to the catheter as well as recent pleurodesis for which she will be given some refills on her oxycodone.  During her stay, she was noted to have possible pneumonia to this area which was difficult to assess given her fluid accumulation there.  She was started on Levaquin with improvement in her leukocytosis and will require 1 more day to finish the course of treatment.  Discharge Diagnoses:  Principal Problem:   Symptomatic anemia Active Problems:   Pleural effusion on left   Upper GI bleeding   Ovarian cancer, bilateral ([place])   HCAP (healthcare-associated pneumonia)  Principal discharge diagnosis: Acute symptomatic blood loss anemia secondary to upper GI bleed related to  reflux esophagitis.  Discharge Instructions  Discharge Instructions    Diet - low sodium heart healthy   Complete by: As directed    Increase activity slowly   Complete by: As directed      Allergies as of 07/21/2018      Reactions   Morphine And Related Itching   Nortriptyline Other (See Comments)   Significant weight gain    Topamax [topiramate] Diarrhea   nausea   Actifed Cold-allergy [chlorpheniramine-phenyleph Er] Rash   Amoxicillin Rash   Did it involve swelling of the face/tongue/throat, SOB, or low BP? Unknown Did it involve sudden or severe rash/hives, skin peeling, or any reaction on the inside of your mouth or nose? Unknown Did you need to seek medical attention at a hospital or doctor's office? Unknown When did it last happen? teenager If all above answers are "NO", may proceed with cephalosporin use.   Codeine Hives   Erythromycin Rash   Penicillins Rash   Did it involve swelling of the face/tongue/throat, SOB, or low BP? Unknown Did it involve sudden or severe rash/hives, skin peeling, or any reaction on the inside of your mouth or nose? Unknown Did you need to seek medical attention at a hospital or doctor's office? Unknown When did it last happen? teenager If all above answers are "NO", may proceed with cephalosporin use.   Sudafed [pseudoephedrine Hcl] Rash      Medication List    STOP taking these medications   ibuprofen 600 MG tablet Commonly known as: ADVIL     TAKE these medications   benzonatate 100 MG capsule Commonly known as: Tessalon Perles Take 1 capsule (100 mg total) by mouth 3 (three) times daily as needed for cough.   diazepam 5 MG tablet Commonly known as: VALIUM TAKE ONE TABLET BY MOUTH AT BEDTIME AS NEEDED FOR ANXIETY What changed:   how much to take  how to take this  when to take this  reasons to take this  additional instructions   docusate sodium 100 MG capsule Commonly known as: Colace Take 1 capsule (100 mg total) by mouth 2 (two) times daily.   levofloxacin 750 MG tablet Commonly known as: LEVAQUIN Take 1 tablet (750 mg total) by mouth daily for 1 day. Start taking on: July 22, 2018   oxyCODONE 5 MG immediate release tablet Commonly known as: Oxy IR/ROXICODONE Take 1-2 tablets (5-10 mg total) by mouth every 4 (four) hours as  needed for severe pain or breakthrough pain. What changed: reasons to take this   pantoprazole 40 MG tablet Commonly known as: PROTONIX Take 1 tablet (40 mg total) by mouth 2 (two) times daily before a meal.   promethazine 25 MG tablet Commonly known as: PHENERGAN Take 1 tablet (25 mg total) by mouth every 6 (six) hours as needed for nausea, vomiting or refractory nausea / vomiting.   senna-docusate 8.6-50 MG tablet Commonly known as: Senokot-S Take 1 tablet by mouth at bedtime.   SUMAtriptan 100 MG tablet Commonly known as: IMITREX TAKE ONE TABLET BY MOUTH PRN UP to TWICE DAILY AS NEEDED. What changed:   how much to take  how to take this  when to take this  reasons to take this  additional instructions   traMADol 50 MG tablet Commonly known as: ULTRAM Take 50 mg by mouth every 8 (eight) hours as needed for pain.      Follow-up Information    Derek Jack, MD On 07/22/2018.   Specialty: Hematology Why: at 3:30  pm Contact information: [address] [place] [address] [phone]        Michelle Chroman, MD Follow up in 1 week(s).   Specialty: Internal Medicine Contact information: [place] [address] [phone]          Allergies  Allergen Reactions  . Morphine And Related Itching  . Nortriptyline Other (See Comments)    Significant weight gain  . Topamax [Topiramate] Diarrhea    nausea  . Actifed Cold-Allergy [Chlorpheniramine-Phenyleph Er] Rash  . Amoxicillin Rash    Did it involve swelling of the face/tongue/throat, SOB, or low BP? Unknown Did it involve sudden or severe rash/hives, skin peeling, or any reaction on the inside of your mouth or nose? Unknown Did you need to seek medical attention at a hospital or doctor's office? Unknown When did it last happen? teenager If all above answers are "NO", may proceed with cephalosporin use.   . Codeine Hives  . Erythromycin Rash  . Penicillins Rash    Did it involve  swelling of the face/tongue/throat, SOB, or low BP? Unknown Did it involve sudden or severe rash/hives, skin peeling, or any reaction on the inside of your mouth or nose? Unknown Did you need to seek medical attention at a hospital or doctor's office? Unknown When did it last happen? teenager If all above answers are "NO", may proceed with cephalosporin use.   [name] [Pseudoephedrine Hcl] Rash    Consultations:  GI   Procedures/Studies: Dg Chest 1 View  Result Date: 07/07/2018 CLINICAL DATA:  Post thoracentesis on the left. EXAM: CHEST  1 VIEW COMPARISON:  Radiographs 07/03/2018 and 06/24/2016.  CT 07/06/2018. FINDINGS: There has been partial re-expansion of the left lung following thoracentesis. There is still a sizable left pleural effusion with near complete whiteout of the left hemithorax. No residual mediastinal shift or pneumothorax identified. The right lung is clear. IMPRESSION: No pneumothorax following thoracentesis on the left. Residual sizable left pleural effusion and left lung collapse. Electronically Signed   By: Richardean Sale M.D.   On: 07/07/2018 10:31   Dg Chest 2 View  Result Date: 07/18/2018 CLINICAL DATA:  Patient with generalized weakness and fever. History of ovarian carcinoma. EXAM: CHEST - 2 VIEW COMPARISON:  Chest radiograph 07/16/2018 FINDINGS: Stable enlarged cardiac and mediastinal contours. Left chest tube remains in position. Persistent moderate left pleural effusion. Similar-appearing patchy consolidation to the left mid and upper lung. No definite pneumothorax. IMPRESSION: Left chest tube remains in position. No definite pneumothorax. Persistent moderate left pleural effusion with underlying consolidation. Electronically Signed   By: Lovey Newcomer M.D.   On: 07/18/2018 15:03   Dg Chest 2 View  Result Date: 07/16/2018 CLINICAL DATA:  Follow-up chest tube removal EXAM: CHEST - 2 VIEW COMPARISON:  07/15/2018 FINDINGS: Cardiac shadow is stable. Right-sided  PICC line is again seen and stable. One of the 2 left-sided chest tubes has been removed in the interval. No pneumothorax is identified. Persistent left basilar atelectasis and effusion is seen. IMPRESSION: Interval chest tube removal on the left without recurrent pneumothorax. Stable atelectatic changes and effusion in the left base. Electronically Signed   By: Inez Catalina M.D.   On: 07/16/2018 08:29   Dg Chest 2 View  Result Date: 07/08/2018 CLINICAL DATA:  Thoracentesis, left-sided chest pain EXAM: CHEST - 2 VIEW COMPARISON:  07/07/2018 FINDINGS: No significant change in a large left pleural effusion, with near-total consolidation of the left lung. There is no acute appearing airspace  opacity of the aerated portion of the left pulmonary apex. No significant pneumothorax. The right lung is normally aerated. The cardiac silhouette is predominantly obscured. Disc degenerative disease of the thoracic spine IMPRESSION: No significant change in a large left pleural effusion, with near-total consolidation of the left lung. There is no acute appearing airspace opacity of the aerated portion of the left pulmonary apex. No significant pneumothorax. The right lung is normally aerated. Electronically Signed   By: Eddie Candle M.D.   On: 07/08/2018 09:37   Ct Chest W Contrast  Result Date: 07/09/2018 CLINICAL DATA:  Large, reaccumulating left pleural effusion EXAM: CT CHEST WITH CONTRAST TECHNIQUE: Multidetector CT imaging of the chest was performed during intravenous contrast administration. CONTRAST:  79mL OMNIPAQUE IOHEXOL 300 MG/ML  SOLN COMPARISON:  Chest radiograph, 07/09/2018 FINDINGS: Cardiovascular: No significant vascular findings. Normal heart size. No pericardial effusion. Mediastinum/Nodes: There are prominent although not pathologically enlarged mediastinal lymph nodes. Moderate hiatal hernia. Thyroid gland, trachea, and esophagus demonstrate no significant findings. Lungs/Pleura: There is a moderate,  loculated left hydropneumothorax with a small air component and moderate fluid component. The largest loculated component is located posteriorly. There is a pigtail drainage catheter about the lateral pleural space. There is a small right pleural effusion with associated atelectasis or consolidation and a subpleural consolidation of the superior segment right lower lobe (series 4, image 56). Upper Abdomen: No acute abnormality. Musculoskeletal: No chest wall mass or suspicious bone lesions identified. IMPRESSION: 1. There is a moderate, loculated left hydropneumothorax with a small air component and moderate fluid component. The largest loculated component is located posteriorly. There is a pigtail drainage catheter about the lateral pleural space. There is no obvious etiology, such as obvious mass or pleural disease. 2. There is a small right pleural effusion with associated atelectasis or consolidation and a subpleural consolidation of the superior segment right lower lobe (series 4, image 56), of uncertain significance, possibly infectious or inflammatory. Electronically Signed   By: Eddie Candle M.D.   On: 07/09/2018 11:31   Ct Abdomen Pelvis W Contrast  Result Date: 07/10/2018 CLINICAL DATA:  Large left pleural effusion, suspect gyn primary malignancy EXAM: CT ABDOMEN AND PELVIS WITH CONTRAST TECHNIQUE: Multidetector CT imaging of the abdomen and pelvis was performed using the standard protocol following bolus administration of intravenous contrast. CONTRAST:  141mL OMNIPAQUE IOHEXOL 300 MG/ML  SOLN COMPARISON:  CT chest, 07/09/2018, CT abdomen pelvis, 05/10/2011 FINDINGS: Lower chest: Loculated left-sided pleural effusion with left-sided pleural drainage catheter in position. Small right pleural effusion. Hepatobiliary: No focal liver abnormality is seen. Status post cholecystectomy. No biliary dilatation. Pancreas: Unremarkable. No pancreatic ductal dilatation or surrounding inflammatory changes. Spleen:  Normal in size without significant abnormality. Adrenals/Urinary Tract: Adrenal glands are unremarkable. Kidneys are normal, without renal calculi, solid lesion, or hydronephrosis. Bladder is unremarkable. Stomach/Bowel: Stomach is within normal limits. Appendix appears normal. No evidence of bowel wall thickening, distention, or inflammatory changes. Large burden of stool in the colon. Vascular/Lymphatic: No significant vascular findings are present. No enlarged abdominal or pelvic lymph nodes. Reproductive: The bilateral ovaries are enlarged by heterogeneous appearing cystic lesions, measuring 5.3 x 4.2 cm on the right (series 4, image 72) and 4.5 x 3.2 cm on the left (series 4, image 75). Other: No abdominal wall hernia or abnormality. Trace ascites. There is some suggestion of omental and peritoneal nodularity (e.g. Series 4, image 43). Musculoskeletal: No acute or significant osseous findings. IMPRESSION: 1. The bilateral ovaries are enlarged by heterogeneous appearing cystic  lesions, measuring 5.3 x 4.2 cm on the right (series 4, image 72) and 4.5 x 3.2 cm on the left (series 4, image 75). Consider dedicated pelvic ultrasound and/or pelvic MRI to further evaluate for solid components given high suspicion for GYN primary malignancy. 2. No other evidence of mass and no lymphadenopathy in the abdomen or pelvis. 3. Trace ascites. There is some suggestion of omental and peritoneal nodularity (e.g. Series 4, image 43), concerning for peritoneal metastatic disease. 4. Loculated left-sided pleural effusion with left-sided pleural drainage catheter in position. Small right pleural effusion. Electronically Signed   By: Eddie Candle M.D.   On: 07/10/2018 21:14   Dg Chest Port 1 View  Result Date: 07/15/2018 CLINICAL DATA:  Chest 2 EXAM: PORTABLE CHEST 1 VIEW COMPARISON:  Portable exam 1610 hours compared to 07/14/2018 FINDINGS: Pair of LEFT thoracostomy tubes are again identified. RIGHT arm PICC line tip projects over  SVC. Upper normal heart size. Mediastinal contours and pulmonary vascularity normal. Minimal pleural effusion at RIGHT base. Tiny LEFT apex pneumothorax. Decreased atelectasis LEFT lower lobe. Remaining lungs clear. IMPRESSION: Minimal LEFT pleural effusion and tiny LEFT pneumothorax despite thoracostomy tubes. Decreased atelectasis LEFT lower lobe. Electronically Signed   By: Lavonia Dana M.D.   On: 07/15/2018 08:50   Dg Chest Port 1 View  Result Date: 07/14/2018 CLINICAL DATA:  Follow-up pleural effusion EXAM: PORTABLE CHEST 1 VIEW COMPARISON:  07/13/2010 FINDINGS: Cardiac shadow is stable. Two chest tubes are again identified on the left. No definitive pneumothorax is identified. Mild haziness is noted at the left base consistent with small residual effusion. Left retrocardiac atelectasis is again noted. Right-sided PICC line is seen in satisfactory position. IMPRESSION: Stable chest tubes on the left. No pneumothorax is noted. Persistent basilar density is noted consistent with atelectasis and small effusion. Electronically Signed   By: Inez Catalina M.D.   On: 07/14/2018 07:06   Dg Chest Port 1 View  Result Date: 07/13/2018 CLINICAL DATA:  Status post left-sided VATS. EXAM: PORTABLE CHEST 1 VIEW COMPARISON:  July 13, 2018 FINDINGS: The patient has been extubated. The right-sided PICC line is well position. Two left-sided chest tubes are in place. There is no large pneumothorax. Subcutaneous gas is noted along the patient's left flank. A dense retrocardiac opacity is noted. There are small bilateral pleural effusions. No right-sided pneumothorax. No acute osseous abnormality. IMPRESSION: 1. Status post extubation.  Remaining lines and tubes as above. 2. Persistent dense retrocardiac opacity, stable from prior study. 3. Subcutaneous gas along the patient's left flank, slightly increased from prior study. Electronically Signed   By: Constance Holster M.D.   On: 07/13/2018 21:45   Dg Chest Port 1  View  Addendum Date: 07/13/2018   ADDENDUM REPORT: 07/13/2018 18:14 ADDENDUM: The patient's RN reports that the patient has been extubated at this time. Electronically Signed   By: Genevie Ann M.D.   On: 07/13/2018 18:14   Result Date: 07/13/2018 CLINICAL DATA:  48 year old female status post left VATS today for loculated effusion. Postoperative day zero. EXAM: PORTABLE CHEST 1 VIEW COMPARISON:  07/11/2018 and earlier. FINDINGS: Portable AP supine view at 1801 hours. Two left chest tubes are in place. Both tubes course toward the medial left apex. No pneumothorax identified. Endotracheal tube in place with evidence of a left mainstem bronchus intubation. Stable right IJ PICC line. Lower lung volumes. Stable cardiac size and mediastinal contours. Regressed but not fully resolved dense left lung base opacity. Negative visible bowel gas pattern. Stable cholecystectomy  clips. IMPRESSION: 1. Left mainstem bronchus intubation suspected. Withdrawal the ET tube 2 cm and repeat a portable chest. 2. Two left chest tubes in place. No pneumothorax identified. 3. Lower lung volumes.  Mildly improved left lung base ventilation. Electronically Signed: By: Genevie Ann M.D. On: 07/13/2018 18:01   Dg Chest Port 1 View  Result Date: 07/11/2018 CLINICAL DATA:  48 year old female with a history of left-sided pleural effusion. EXAM: PORTABLE CHEST 1 VIEW COMPARISON:  07/10/2018, 07/09/2018, CT 07/09/2010 FINDINGS: Cardiomediastinal silhouette unchanged with the left heart border partially obscured by lung/pleural disease. Unchanged left thoracostomy tube. Contour of the medial aspect of the left upper lobe is visualized at the apex, the so-called "luftsichel sign", new from the comparison and representing partial collapse of the left upper lobe. Right lung relatively well aerated. No new confluent airspace disease Unchanged right upper extremity PICC. IMPRESSION: Unchanged left-sided thoracostomy tube, with hydropneumothorax. The fluid  component is relatively unchanged at the left lung base, with new left upper lobe collapse and small gas component at the apex. Opacity at the left lung base likely a combination of pleural fluid/loculated pleural fluid and associated atelectasis/consolidation. Unchanged right upper extremity PICC. Electronically Signed   By: Corrie Mckusick D.O.   On: 07/11/2018 08:53   Dg Chest Port 1 View  Result Date: 07/10/2018 CLINICAL DATA:  Pleural effusion EXAM: PORTABLE CHEST 1 VIEW COMPARISON:  Chest CT and chest radiograph July 09, 2018 FINDINGS: Chest tube position is unchanged. No pneumothorax is demonstrable by radiography. Central catheter tip is in the superior vena cava. Moderate pleural effusion persists on the left with compressive atelectasis in the left lower lobe. No new opacity evident on either side. Heart is upper normal in size, stable, with pulmonary vascularity within normal limits. No adenopathy. No bone lesions. IMPRESSION: Persistent left pleural effusion with left base compressive atelectasis. No new opacity evident. Stable cardiac silhouette. Tube and catheter positions unchanged without demonstrable pneumothorax by radiography. Electronically Signed   By: Lowella Grip III M.D.   On: 07/10/2018 09:33   Dg Chest Port 1 View  Result Date: 07/09/2018 CLINICAL DATA:  Left pleural effusion.  Chest tube in place. EXAM: PORTABLE CHEST 1 VIEW COMPARISON:  PA and lateral chest 07/08/2018. FINDINGS: New right PICC is identified with its tip projecting at the superior cavoatrial junction. The patient also has a new pigtail catheter in the left chest. Small to moderate left pleural effusion is decreased compared to the prior examination. No pneumothorax. Left basilar airspace disease persists. Small focus of discoid atelectasis right mid lung noted. Heart size is upper normal. IMPRESSION: Decreased left pleural effusion with a new chest tube in place. Negative for pneumothorax. Electronically Signed    By: Inge Rise M.D.   On: 07/09/2018 09:14   Dg Chest Port 1 View  Result Date: 07/07/2018 CLINICAL DATA:  Left-sided pleural effusion status post thoracentesis EXAM: PORTABLE CHEST 1 VIEW COMPARISON:  July 07, 2018 FINDINGS: There is a persistent large left-sided pleural effusion. There is no pneumothorax. The heart is difficult to fully evaluate on this exam. The right lung field is mostly clear. There is some mild volume overload. IMPRESSION: Interval decrease in size of the previously demonstrated left-sided pleural effusion. There is a persistent large left-sided pleural effusion without evidence of a pneumothorax. Electronically Signed   By: Constance Holster M.D.   On: 07/07/2018 23:33   Ir Picc Placement Right >5 Yrs Inc Img Guide  Result Date: 07/08/2018 CLINICAL DATA:  Large  left pleural effusion, poor IV access EXAM: PICC PLACEMENT WITH ULTRASOUND AND FLUOROSCOPY FLUOROSCOPY TIME:  18 seconds; 3 mGy TECHNIQUE: After written informed consent was obtained, patient was placed in the supine position on angiographic table. Patency of the right brachial vein was confirmed with ultrasound with image documentation. An appropriate skin site was determined. Skin site was marked. Region was prepped using maximum barrier technique including cap and mask, sterile gown, sterile gloves, large sterile sheet, and Chlorhexidine as cutaneous antisepsis. The region was infiltrated locally with 1% lidocaine. Under real-time ultrasound guidance, the right brachial vein was accessed with a 21 gauge micropuncture needle; the needle tip within the vein was confirmed with ultrasound image documentation. Needle exchanged over a 018 guidewire for a peel-away sheath, through which a 5-French double-lumen power injectable PICC trimmed to 38cm was advanced, positioned with its tip near the cavoatrial junction. Spot chest radiograph confirms appropriate catheter position. Catheter was flushed per protocol and secured  externally. The patient tolerated procedure well. COMPLICATIONS: COMPLICATIONS none IMPRESSION: 1. Technically successful five [place] double lumen power injectable PICC placement Electronically Signed   By: Lucrezia Europe M.D.   On: 07/08/2018 16:40   Ir Thoracentesis Asp Pleural Space W/img Guide  Result Date: 07/07/2018 INDICATION: Patient with history of migraines, tobacco abuse and new onset dyspnea with large left pleural effusion of uncertain etiology. Request for diagnostic and therapeutic left side thoracentesis today in IR. EXAM: ULTRASOUND GUIDED LEFT THORACENTESIS MEDICATIONS: 13 mL 1% lidocaine COMPLICATIONS: None immediate. PROCEDURE: An ultrasound guided thoracentesis was thoroughly discussed with the patient and questions answered. The benefits, risks, alternatives and complications were also discussed. The patient understands and wishes to proceed with the procedure. Written consent was obtained. Ultrasound was performed to localize and mark an adequate pocket of fluid in the left chest. The area was then prepped and draped in the normal sterile fashion. 1% Lidocaine was used for local anesthesia. Under ultrasound guidance a 6 Fr Safe-T-Centesis catheter was introduced. Thoracentesis was performed. The catheter was removed and a dressing applied. FINDINGS: A total of approximately 2.0 L of golden yellow, blood tinged fluid was removed. Samples were sent to the laboratory as requested by the clinical team. IMPRESSION: Successful ultrasound guided left thoracentesis yielding 2.0 L of pleural fluid. Read by Candiss Norse, PA-C Electronically Signed   By: Sandi Mariscal M.D.   On: 07/07/2018 10:36   Ir Perc Pleural Drain W/indwell Cath W/img Guide  Result Date: 07/08/2018 CLINICAL DATA:  Large left pleural effusion EXAM: LEFT CHEST TUBE PLACEMENT UNDER ULTRASOUND AND FLUOROSCOPIC GUIDANCE ANESTHESIA/SEDATION: Intravenous Fentanyl 1108mcg and Versed 4mg  were administered as conscious sedation during  continuous monitoring of the patient's level of consciousness and physiological / cardiorespiratory status by the radiology RN, with a total moderate sedation time of 49 minutes. PROCEDURE: The procedure, risks, benefits, and alternatives were explained to the patient. Questions regarding the procedure were encouraged and answered. The patient understands and consents to the procedure. appropriate skin entry site was determined with ultrasound and marked. The operative field was prepped with chlorhexidinein a sterile fashion, and a sterile drape was applied covering the operative field. A sterile gown and sterile gloves were used for the procedure. Local anesthesia was provided with 1% Lidocaine. Under ultrasound guidance, a 19 gauge percutaneous entry needle advanced into the left pleural space from a lateral approach. Blood-tinged fluid could be aspirated. Amplatz guidewire advanced towards the apex under fluoroscopy. Tract dilated to facilitate placement of 14 French pigtail catheter, directed towards the  apex. Catheter secured externally with 0 Prolene suture and placed to Pleur-evac. 500 mL returned spontaneously into the canister without suction. Site was covered with a sterile dressing the patient returned to her room in good condition. COMPLICATIONS: None immediate FINDINGS: Large left pleural effusion was noted. 14 French pigtail catheter placed as above. IMPRESSION: 1. Technically successful placement of left 14 French pigtail chest drain, placed to Pleur-evac water-seal. Electronically Signed   By: Lucrezia Europe M.D.   On: 07/08/2018 16:42     Discharge Exam: Vitals:   07/20/18 2124 07/21/18 0652  BP: 106/61 (!) 104/59  Pulse: 97 88  Resp: 15 15  Temp: (!) 100.7 F (38.2 C) 98.7 F (37.1 C)  SpO2: 90% 92%   Vitals:   07/20/18 1037 07/20/18 1432 07/20/18 2124 07/21/18 0652  BP: (!) 100/54 (!) 100/52 106/61 (!) 104/59  Pulse: 87 93 97 88  Resp: 19 18 15 15   Temp:  99.5 F (37.5 C) (!)  100.7 F (38.2 C) 98.7 F (37.1 C)  TempSrc:  Oral Oral Oral  SpO2: (!) 86% 90% 90% 92%  Weight:      Height:        General: Pt is alert, awake, not in acute distress Cardiovascular: RRR, S1/S2 +, no rubs, no gallops Respiratory: CTA bilaterally, no wheezing, no rhonchi Abdominal: Soft, NT, ND, bowel sounds + Extremities: no edema, no cyanosis    The results of significant diagnostics from this hospitalization (including imaging, microbiology, ancillary and laboratory) are listed below for reference.     Microbiology: Recent Results (from the past 240 hour(s))  Culture, blood (Routine x 2)     Status: None (Preliminary result)   Collection Time: 07/18/18  2:25 PM   Specimen: BLOOD RIGHT HAND  Result Value Ref Range Status   Specimen Description   Final    BLOOD RIGHT HAND BOTTLES DRAWN AEROBIC AND ANAEROBIC   Special Requests Blood Culture adequate volume  Final   Culture   Final    NO GROWTH 3 DAYS Performed at [facility], [address], [place], [place] [address]    Report Status PENDING  Incomplete  Culture, blood (Routine x 2)     Status: None (Preliminary result)   Collection Time: 07/18/18  2:38 PM   Specimen: Left Antecubital; Blood  Result Value Ref Range Status   Specimen Description   Final    LEFT ANTECUBITAL BOTTLES DRAWN AEROBIC AND ANAEROBIC   Special Requests   Final    Blood Culture results may not be optimal due to an excessive volume of blood received in culture bottles   Culture   Final    NO GROWTH 3 DAYS Performed at [facility], [address], [place], [place] [address]    Report Status PENDING  Incomplete  SARS Coronavirus 2 (CEPHEID - Performed in [place] hospital lab), Hosp Order     Status: None   Collection Time: 07/18/18  5:02 PM   Specimen: Nasopharyngeal Swab  Result Value Ref Range Status   SARS Coronavirus 2 NEGATIVE NEGATIVE Final    Comment: (NOTE) If result is NEGATIVE SARS-CoV-2 target nucleic acids are NOT  DETECTED. The SARS-CoV-2 RNA is generally detectable in upper and lower  respiratory specimens during the acute phase of infection. The lowest  concentration of SARS-CoV-2 viral copies this assay can detect is 250  copies / mL. A negative result does not preclude SARS-CoV-2 infection  and should not be used as the sole basis for treatment  or other  patient management decisions.  A negative result may occur with  improper specimen collection / handling, submission of specimen other  than nasopharyngeal swab, presence of viral mutation(s) within the  areas targeted by this assay, and inadequate number of viral copies  (<250 copies / mL). A negative result must be combined with clinical  observations, patient history, and epidemiological information. If result is POSITIVE SARS-CoV-2 target nucleic acids are DETECTED. The SARS-CoV-2 RNA is generally detectable in upper and lower  respiratory specimens dur ing the acute phase of infection.  Positive  results are indicative of active infection with SARS-CoV-2.  Clinical  correlation with patient history and other diagnostic information is  necessary to determine patient infection status.  Positive results do  not rule out bacterial infection or co-infection with other viruses. If result is PRESUMPTIVE POSTIVE SARS-CoV-2 nucleic acids MAY BE PRESENT.   A presumptive positive result was obtained on the submitted specimen  and confirmed on repeat testing.  While 2019 novel coronavirus  (SARS-CoV-2) nucleic acids may be present in the submitted sample  additional confirmatory testing may be necessary for epidemiological  and / or clinical management purposes  to differentiate between  SARS-CoV-2 and other Sarbecovirus currently known to infect humans.  If clinically indicated additional testing with an alternate test  methodology [phone]) is advised. The SARS-CoV-2 RNA is generally  detectable in upper and lower respiratory sp ecimens during  the acute  phase of infection. The expected result is Negative. Fact Sheet for Patients:  StrictlyIdeas.no Fact Sheet for Healthcare Providers: BankingDealers.co.za This test is not yet approved or cleared by the [place] FDA and has been authorized for detection and/or diagnosis of SARS-CoV-2 by FDA under an Emergency Use Authorization (EUA).  This EUA will remain in effect (meaning this test can be used) for the duration of the COVID-19 declaration under Section 564(b)(1) of the Act, 21 U.S.C. section 360bbb-3(b)(1), unless the authorization is terminated or revoked sooner. Performed at [facility], [address], [place], [place] [address]   Urine Culture     Status: None   Collection Time: 07/18/18  6:03 PM   Specimen: Urine, Clean Catch  Result Value Ref Range Status   Specimen Description   Final    URINE, CLEAN CATCH Performed at [facility], [address] [place], [place] [address]    Special Requests   Final    NONE Performed at [facility], [place] [address] [place], [place] [address]    Culture   Final    Multiple bacterial morphotypes present, none predominant. Suggest appropriate recollection if clinically indicated.   Report Status 07/20/2018 FINAL  Final     Labs: BNP (last 3 results) Recent Labs    07/07/18 0010  BNP 75.6   Basic Metabolic Panel: Recent Labs  Lab 07/16/18 0425 07/18/18 1425 07/19/18 0824 07/20/18 0529 07/21/18 0434  NA 137 133* 134* 134* 135  K 4.1 3.9 3.9 4.0 3.6  CL 100 92* 97* 95* 99  CO2 28 27 26 28 26   GLUCOSE 90 103* 88 91 116*  BUN 8 25* 14 8 8   CREATININE 0.69 0.91 0.67 0.65 0.65  CALCIUM 8.6* 8.3* 8.1* 8.5* 8.3*   Liver Function Tests: Recent Labs  Lab 07/15/18 0424 07/18/18 1425  AST 19 32  ALT 53* 48*  ALKPHOS 203* 242*  BILITOT 0.5 0.7  PROT 5.5* 6.0*  ALBUMIN 2.2* 2.6*   No results for input(s): LIPASE, AMYLASE in the last  168 hours. No results  for input(s): AMMONIA in the last 168 hours. CBC: Recent Labs  Lab 07/16/18 0425 07/18/18 1425 07/19/18 0824 07/20/18 0529 07/21/18 0434  WBC 11.3* 28.7* 18.7* 16.3* 14.1*  NEUTROABS  --  23.3*  --   --   --   HGB 10.1* 4.1* 9.9* 10.3* 10.4*  HCT 31.8* 12.8* 29.8* 32.2* 32.3*  MCV 101.6* 102.4* 91.7 95.3 96.1  PLT 377 520* 357 346 312   Cardiac Enzymes: No results for input(s): CKTOTAL, CKMB, CKMBINDEX, TROPONINI in the last 168 hours. BNP: Invalid input(s): POCBNP CBG: Recent Labs  Lab 07/15/18 1718 07/15/18 2118 07/16/18 0630 07/16/18 1107 07/18/18 1553  GLUCAP 127* 73 68* 67* 99   D-Dimer No results for input(s): DDIMER in the last 72 hours. Hgb A1c No results for input(s): HGBA1C in the last 72 hours. Lipid Profile No results for input(s): CHOL, HDL, LDLCALC, TRIG, CHOLHDL, LDLDIRECT in the last 72 hours. Thyroid function studies No results for input(s): TSH, T4TOTAL, T3FREE, THYROIDAB in the last 72 hours.  Invalid input(s): FREET3 Anemia work up No results for input(s): VITAMINB12, FOLATE, FERRITIN, TIBC, IRON, RETICCTPCT in the last 72 hours. Urinalysis    Component Value Date/Time   COLORURINE YELLOW 07/18/2018 1803   APPEARANCEUR CLEAR 07/18/2018 1803   LABSPEC 1.011 07/18/2018 1803   PHURINE 5.0 07/18/2018 1803   GLUCOSEU NEGATIVE 07/18/2018 1803   HGBUR NEGATIVE 07/18/2018 1803   BILIRUBINUR NEGATIVE 07/18/2018 1803   KETONESUR NEGATIVE 07/18/2018 1803   PROTEINUR NEGATIVE 07/18/2018 1803   NITRITE NEGATIVE 07/18/2018 1803   LEUKOCYTESUR NEGATIVE 07/18/2018 1803   Sepsis Labs Invalid input(s): PROCALCITONIN,  WBC,  LACTICIDVEN Microbiology Recent Results (from the past 240 hour(s))  Culture, blood (Routine x 2)     Status: None (Preliminary result)   Collection Time: 07/18/18  2:25 PM   Specimen: BLOOD RIGHT HAND  Result Value Ref Range Status   Specimen Description   Final    BLOOD RIGHT HAND BOTTLES DRAWN AEROBIC AND ANAEROBIC   Special  Requests Blood Culture adequate volume  Final   Culture   Final    NO GROWTH 3 DAYS Performed at [facility], [address], [place], [place] [address]    Report Status PENDING  Incomplete  Culture, blood (Routine x 2)     Status: None (Preliminary result)   Collection Time: 07/18/18  2:38 PM   Specimen: Left Antecubital; Blood  Result Value Ref Range Status   Specimen Description   Final    LEFT ANTECUBITAL BOTTLES DRAWN AEROBIC AND ANAEROBIC   Special Requests   Final    Blood Culture results may not be optimal due to an excessive volume of blood received in culture bottles   Culture   Final    NO GROWTH 3 DAYS Performed at [facility], [address] [place], [place] [address]    Report Status PENDING  Incomplete  SARS Coronavirus 2 (CEPHEID - Performed in [place] hospital lab), Hosp Order     Status: None   Collection Time: 07/18/18  5:02 PM   Specimen: Nasopharyngeal Swab  Result Value Ref Range Status   SARS Coronavirus 2 NEGATIVE NEGATIVE Final    Comment: (NOTE) If result is NEGATIVE SARS-CoV-2 target nucleic acids are NOT DETECTED. The SARS-CoV-2 RNA is generally detectable in upper and lower  respiratory specimens during the acute phase of infection. The lowest  concentration of SARS-CoV-2 viral copies this assay can detect is 250  copies / mL. A  negative result does not preclude SARS-CoV-2 infection  and should not be used as the sole basis for treatment or other  patient management decisions.  A negative result may occur with  improper specimen collection / handling, submission of specimen other  than nasopharyngeal swab, presence of viral mutation(s) within the  areas targeted by this assay, and inadequate number of viral copies  (<250 copies / mL). A negative result must be combined with clinical  observations, patient history, and epidemiological information. If result is POSITIVE SARS-CoV-2 target nucleic acids are DETECTED. The SARS-CoV-2 RNA is  generally detectable in upper and lower  respiratory specimens dur ing the acute phase of infection.  Positive  results are indicative of active infection with SARS-CoV-2.  Clinical  correlation with patient history and other diagnostic information is  necessary to determine patient infection status.  Positive results do  not rule out bacterial infection or co-infection with other viruses. If result is PRESUMPTIVE POSTIVE SARS-CoV-2 nucleic acids MAY BE PRESENT.   A presumptive positive result was obtained on the submitted specimen  and confirmed on repeat testing.  While 2019 novel coronavirus  (SARS-CoV-2) nucleic acids may be present in the submitted sample  additional confirmatory testing may be necessary for epidemiological  and / or clinical management purposes  to differentiate between  SARS-CoV-2 and other Sarbecovirus currently known to infect humans.  If clinically indicated additional testing with an alternate test  methodology [id number]) is advised. The SARS-CoV-2 RNA is generally  detectable in upper and lower respiratory sp ecimens during the acute  phase of infection. The expected result is Negative. Fact Sheet for Patients:  StrictlyIdeas.no Fact Sheet for Healthcare Providers: BankingDealers.co.za This test is not yet approved or cleared by the [place] FDA and has been authorized for detection and/or diagnosis of SARS-CoV-2 by FDA under an Emergency Use Authorization (EUA).  This EUA will remain in effect (meaning this test can be used) for the duration of the COVID-19 declaration under Section 564(b)(1) of the Act, 21 U.S.C. section 360bbb-3(b)(1), unless the authorization is terminated or revoked sooner. Performed at [facility], [address] [place], [place] [address]   Urine Culture     Status: None   Collection Time: 07/18/18  6:03 PM   Specimen: Urine, Clean Catch  Result Value Ref Range Status    Specimen Description   Final    URINE, CLEAN CATCH Performed at [facility], [address], [place], [place] [address]    Special Requests   Final    NONE Performed at [facility], [place] [address] [place], [place] [address]    Culture   Final    Multiple bacterial morphotypes present, none predominant. Suggest appropriate recollection if clinically indicated.   Report Status 07/20/2018 FINAL  Final     Time coordinating discharge: 35 minutes  SIGNED:   Rodena Goldmann, DO [facility] 07/21/2018, 1:43 PM  If 7PM-7AM, please contact night-coverage [email or web address] Password [email or web address]

## 2018-07-21 NOTE — Plan of Care (Signed)
  Problem: Education: Goal: Knowledge of General Education information will improve Description: Including pain rating scale, medication(s)/side effects and non-pharmacologic comfort measures Outcome: Progressing   Problem: Health Behavior/Discharge Planning: Goal: Ability to manage health-related needs will improve Outcome: Progressing   Problem: Clinical Measurements: Goal: Ability to maintain clinical measurements within normal limits will improve Outcome: Progressing Goal: Will remain free from infection Outcome: Progressing Goal: Diagnostic test results will improve Outcome: Progressing Goal: Respiratory complications will improve Outcome: Progressing Goal: Cardiovascular complication will be avoided Outcome: Progressing   Problem: Activity: Goal: Risk for activity intolerance will decrease Outcome: Progressing   Problem: Nutrition: Goal: Adequate nutrition will be maintained Outcome: Progressing   Problem: Coping: Goal: Level of anxiety will decrease Outcome: Progressing   Problem: Elimination: Goal: Will not experience complications related to bowel motility Outcome: Progressing Goal: Will not experience complications related to urinary retention Outcome: Progressing   Problem: Pain Managment: Goal: General experience of comfort will improve Outcome: Progressing   Problem: Safety: Goal: Ability to remain free from injury will improve Outcome: Progressing   Problem: Skin Integrity: Goal: Risk for impaired skin integrity will decrease Outcome: Progressing   Problem: Bowel/Gastric: Goal: Will show no signs and symptoms of gastrointestinal bleeding Outcome: Progressing   Problem: Fluid Volume: Goal: Will show no signs and symptoms of excessive bleeding Outcome: Progressing   Problem: Clinical Measurements: Goal: Complications related to the disease process, condition or treatment will be avoided or minimized Outcome: Progressing

## 2018-07-21 NOTE — Evaluation (Signed)
Physical Therapy Evaluation Patient Details Name: Michelle Holt MRN: [id number] DOB: 1971-01-05 Today's Date: 07/21/2018   History of Present Illness  Michelle Holt is a 48 y.o. female with a history of anxiety depression, and insomnia.  Patient recently hospitalized due to collapsed lung with large pleural effusion on the left side.  She was admitted to the hospital and had a thoracentesis, with L VATS procedure for loculated pleural effusion.  At the time, she was being treated for pneumonia as well.  The patient began to have some abdominal pain on the day of discharge (7/2) and had a large black stool prior to leaving the hospital.  Upon leaving, the patient felt very weak tired and had difficulty getting into the house upon getting home.  Her symptoms continued to get worse over the next couple of days.  She reported another couple of small black stools but none today.  Her symptoms are worsening.  No palliating or provoking factors.  She came to the emergency department for evaluation.    Clinical Impression  Patient functioning near baseline for functional mobility and gait, demonstrates good return for getting into/out of bed, transferring to commode in bathroom, walking in hallway and on stairs in stairwell without assistance or loss of balance.  Patient limited mostly due to left sided pain/discomfort and mild fatigue, states she can and will do BLE ROM exercises on her own at home and does not want HHPT.  Plan:  Patient discharged from physical therapy to care of nursing for ambulation daily as tolerated for length of stay.     Follow Up Recommendations No PT follow up    Equipment Recommendations  None recommended by PT    Recommendations for Other Services       Precautions / Restrictions Precautions Precautions: None Restrictions Weight Bearing Restrictions: No      Mobility  Bed Mobility Overal bed mobility: Independent                Transfers Overall  transfer level: Independent                  Ambulation/Gait Ambulation/Gait assistance: Modified independent (Device/Increase time) Gait Distance (Feet): 250 Feet Assistive device: None Gait Pattern/deviations: WFL(Within Functional Limits) Gait velocity: decreased   General Gait Details: slightly labored cadence without loss of balance  Stairs Stairs: Yes Stairs assistance: Modified independent (Device/Increase time) Stair Management: Step to pattern;One rail Left Number of Stairs: 6 General stair comments: demonstrates good return for going up/down stairs without loss of balance using 1 siderail  Wheelchair Mobility    Modified Rankin (Stroke Patients Only)       Balance Overall balance assessment: No apparent balance deficits (not formally assessed)                                           Pertinent Vitals/Pain Pain Assessment: 0-10 Pain Score: 6  Pain Location: 5-6/10 left flank Pain Descriptors / Indicators: Sore Pain Intervention(s): Limited activity within patient's tolerance;Monitored during session    Home Living Family/patient expects to be discharged to:: Private residence   Available Help at Discharge: Family;Available PRN/intermittently Type of Home: Apartment Home Access: Stairs to enter Entrance Stairs-Rails: Right Entrance Stairs-Number of Steps: 4 Home Layout: One level Home Equipment: Bedside commode      Prior Function Level of Independence: Independent  Comments: Hydrographic surveyor, drives     Journalist, newspaper        Extremity/Trunk Assessment   Upper Extremity Assessment Upper Extremity Assessment: Overall WFL for tasks assessed    Lower Extremity Assessment Lower Extremity Assessment: Overall WFL for tasks assessed    Cervical / Trunk Assessment Cervical / Trunk Assessment: Normal  Communication   Communication: No difficulties  Cognition Arousal/Alertness: Awake/alert Behavior During  Therapy: WFL for tasks assessed/performed Overall Cognitive Status: Within Functional Limits for tasks assessed                                        General Comments      Exercises     Assessment/Plan    PT Assessment Patent does not need any further PT services  PT Problem List         PT Treatment Interventions      PT Goals (Current goals can be found in the Care Plan section)  Acute Rehab PT Goals Patient Stated Goal: Return home with family to assist PT Goal Formulation: With patient Time For Goal Achievement: 07/21/18 Potential to Achieve Goals: Good    Frequency     Barriers to discharge        Co-evaluation               AM-PAC PT "6 Clicks" Mobility  Outcome Measure Help needed turning from your back to your side while in a flat bed without using bedrails?: None Help needed moving from lying on your back to sitting on the side of a flat bed without using bedrails?: None Help needed moving to and from a bed to a chair (including a wheelchair)?: None Help needed standing up from a chair using your arms (e.g., wheelchair or bedside chair)?: None Help needed to walk in hospital room?: None Help needed climbing 3-5 steps with a railing? : None 6 Click Score: 24    End of Session   Activity Tolerance: Patient tolerated treatment well;Patient limited by fatigue Patient left: in chair;with call bell/phone within reach Nurse Communication: Mobility status PT Visit Diagnosis: Unsteadiness on feet (R26.81);Other abnormalities of gait and mobility (R26.89);Muscle weakness (generalized) (M62.81)    Time: 0981-1914 PT Time Calculation (min) (ACUTE ONLY): 23 min   Charges:   PT Evaluation $PT Eval Moderate Complexity: 1 Mod PT Treatments $Gait Training: 23-37 mins        10:16 AM, 07/21/18 Lonell Grandchild, MPT Physical Therapist with [facility] [phone] office [phone] mobile phone

## 2018-07-22 ENCOUNTER — Encounter (HOSPITAL_COMMUNITY): Payer: Self-pay | Admitting: *Deleted

## 2018-07-22 ENCOUNTER — Encounter (HOSPITAL_COMMUNITY): Payer: Self-pay | Admitting: Surgery

## 2018-07-22 ENCOUNTER — Other Ambulatory Visit: Payer: Self-pay

## 2018-07-22 ENCOUNTER — Encounter (HOSPITAL_COMMUNITY): Payer: Self-pay | Admitting: Hematology

## 2018-07-22 ENCOUNTER — Inpatient Hospital Stay (HOSPITAL_COMMUNITY): Payer: Medicaid Other | Attending: Hematology | Admitting: Hematology

## 2018-07-22 VITALS — BP 96/44 | HR 96 | Temp 97.1°F | Resp 20 | Wt 163.7 lb

## 2018-07-22 DIAGNOSIS — C563 Malignant neoplasm of bilateral ovaries: Secondary | ICD-10-CM

## 2018-07-22 DIAGNOSIS — R630 Anorexia: Secondary | ICD-10-CM

## 2018-07-22 DIAGNOSIS — D735 Infarction of spleen: Secondary | ICD-10-CM | POA: Diagnosis not present

## 2018-07-22 DIAGNOSIS — J90 Pleural effusion, not elsewhere classified: Secondary | ICD-10-CM | POA: Diagnosis not present

## 2018-07-22 DIAGNOSIS — K279 Peptic ulcer, site unspecified, unspecified as acute or chronic, without hemorrhage or perforation: Secondary | ICD-10-CM | POA: Diagnosis not present

## 2018-07-22 DIAGNOSIS — R0609 Other forms of dyspnea: Secondary | ICD-10-CM | POA: Diagnosis not present

## 2018-07-22 DIAGNOSIS — C561 Malignant neoplasm of right ovary: Secondary | ICD-10-CM | POA: Insufficient documentation

## 2018-07-22 DIAGNOSIS — Z720 Tobacco use: Secondary | ICD-10-CM | POA: Diagnosis not present

## 2018-07-22 DIAGNOSIS — R110 Nausea: Secondary | ICD-10-CM

## 2018-07-22 DIAGNOSIS — C562 Malignant neoplasm of left ovary: Secondary | ICD-10-CM | POA: Diagnosis present

## 2018-07-22 DIAGNOSIS — K5900 Constipation, unspecified: Secondary | ICD-10-CM

## 2018-07-22 DIAGNOSIS — N838 Other noninflammatory disorders of ovary, fallopian tube and broad ligament: Secondary | ICD-10-CM | POA: Diagnosis not present

## 2018-07-22 DIAGNOSIS — R079 Chest pain, unspecified: Secondary | ICD-10-CM | POA: Diagnosis not present

## 2018-07-22 DIAGNOSIS — Z803 Family history of malignant neoplasm of breast: Secondary | ICD-10-CM | POA: Diagnosis not present

## 2018-07-22 MED ORDER — OXYCODONE HCL 10 MG PO TABS: 10.0000 mg | ORAL_TABLET | ORAL | 0 refills | Status: DC | PRN

## 2018-07-22 NOTE — Patient Instructions (Addendum)
[place] at [facility] Discharge Instructions  You were seen today by Dr. Delton Coombes. He went over your history, family history and how you've been feeling lately.  He will send you to a GYN Oncologist. He will schedule a PET scan as well as a genetic consult. He will see you back after your scan for follow up.   Thank you for choosing [place] at [facility] to provide your oncology and hematology care.  To afford each patient quality time with our provider, please arrive at least 15 minutes before your scheduled appointment time.   If you have a lab appointment with the [facility] please come in thru the  Main Entrance and check in at the main information desk  You need to re-schedule your appointment should you arrive 10 or more minutes late.  We strive to give you quality time with our providers, and arriving late affects you and other patients whose appointments are after yours.  Also, if you no show three or more times for appointments you may be dismissed from the clinic at the providers discretion.     Again, thank you for choosing [facility].  Our hope is that these requests will decrease the amount of time that you wait before being seen by our physicians.       _____________________________________________________________  Should you have questions after your visit to [facility], please contact our office at [phone] between the hours of 8:00 a.m. and 4:30 p.m.  Voicemails left after 4:00 p.m. will not be returned until the following business day.  For prescription refill requests, have your pharmacy contact our office and allow 72 hours.    Cancer Center Support Programs:   > Cancer Support Group  2nd Tuesday of the month 1pm-2pm, Journey Room

## 2018-07-22 NOTE — Assessment & Plan Note (Addendum)
1.  Presumed to ovarian cancer, clinical stage IVa: - She developed shortness of breath on exertion in mid June.  She was seen by PMD Dr. Woody Seller who ordered a chest x-ray followed by CT scan done at [facility]. -She was admitted to [facility] from 07/06/2018 through 07/16/2018 for left pleural effusion.  She underwent left VATS on 07/13/2018 with talc pleurodesis and placement of Pleurx catheter. - Cytology of the left pleural fluid on 07/07/2018 shows malignant cells consistent with adenocarcinoma.  The cells are positive for CK7, p53, WT 1, PAX 8, ER (weak) and EMA but negative for CK20, TTF-1, Gata 3, CDX 2 and D2-40.  Overall phenotype was consistent with gynecological primary.  Left pleural biopsy on 07/13/2018 shows fibrinous pleuritis, negative for malignancy. - She was admitted to [facility] with severe tiredness, found to have a hemoglobin of 4.1 and received 4 units of PRBC and 1 unit of FFP on 07/18/2018.  She was discharged on 07/21/2018. -EGD on 07/20/2018 showed grade C reflux esophagitis, peptic ulcer disease and prepyloric ulcer with no active bleeding. - CTAP on 07/10/2018 shows bilateral ovarian masses, trace ascites, some nodularity in the omentum and peritoneum.  No adenopathy was seen.  Ca1 25 was elevated at 581. - I have recommended GYN oncology evaluation.  We will make a referral to Dr. Denman George. -Patient is adamant that she is not going to have any chemotherapy.  This is based on her husband's experience with metastatic lung cancer. -I have recommended germline mutation testing.  Only other family members with cancer was maternal aunt who died of breast cancer.  2.  Left chest wall pain: - She is currently taking 2 tablets of oxycodone every 4 hours which is helping. -I have given a prescription for oxycodone 10 mg every 4 hours as needed.

## 2018-07-22 NOTE — Progress Notes (Signed)
Oncology Navigator Note:  Patient was referred to our office after inpatient admission.  I, unfortunately, will not be able to meet with patient during the visit today.  I called patient to introduce myself and provide information in how I will be involved with her care.  I provided information on her first visit and what to expect.  Patient expresses concerns over finances as she doesn't have insurance coverage. I explained to her that we will not turn her away for lack of insurance.  I offered to her our financial advocates information and she accepted.   I made sure patient was aware of appointment time and directions to the cancer center.  My phone number was given so that she can call me with any questions or concerns.  She voices appreciation and understanding.

## 2018-07-22 NOTE — Progress Notes (Signed)
[facility] CONSULT NOTE  Patient Care Team: [name], MD as PCP - General (Internal Medicine)  CHIEF COMPLAINTS/PURPOSE OF CONSULTATION:  Ovarian cancer.  HISTORY OF PRESENTING ILLNESS:  Michelle Holt 48 y.o. female is seen for further work-up and management of ovarian cancer.  She started developing shortness of breath on exertion in mid June.  She was evaluated by Dr. Woody Seller with a chest x-ray.  This showed left pleural effusion.  She was sent to [facility] for a CT scan which showed abnormalities.  She was then directed to [facility] where she was admitted from 07/06/2018 through 07/16/2018.  She had a left thoracentesis on 07/07/2018 which showed adenocarcinoma consistent with gynecological primary.  She underwent left VATS on 07/13/2018 with left pleural biopsy, talc pleurodesis and placement of Pleurx catheter.  After she was discharged from the hospital, she was feeling very weak.  She came to [facility] and was admitted from 07/18/2018 through 07/21/2018 with a hemoglobin of 4.1 and received 4 units of PRBC and 1 unit of FFP.  She underwent EGD on 07/20/2018 which showed grade C reflux esophagitis, peptic ulcer disease and prepyloric ulcer.  She denies any abdominal pains.  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  Prior to that she worked at [employer].  She is a current active smoker, smoked half pack per day for 30 years.  She lives at home by herself, son lives with her 2 days a week.  Daughter lives down the street.  Family history significant for maternal aunt who died of breast cancer.  Patient denies any fevers, night sweats or weight loss.  She reports decreasing appetite and decreased energy levels.  She does have shortness of breath on exertion.  She also reported constipation and occasional nausea.  She is currently taking oxycodone 5 mg 2 tablets every 4 hours for her left chest wall pain.  Pleurx catheter will reportedly be drained  tomorrow by her home health nurse.  MEDICAL HISTORY:  Past Medical History:  Diagnosis Date  . Anxiety and depression   . Arthritis of facet joints at multiple vertebral levels    L5-S1  . Constipation   . Dyslipidemia   . Insomnia   . Irritable bowel syndrome   . Migraine   . Muscle tension headache   . Plantar fasciitis of right foot     SURGICAL HISTORY: Past Surgical History:  Procedure Laterality Date  . CHOLECYSTECTOMY  2008  . COLONOSCOPY N/A 08/13/2013   Procedure: COLONOSCOPY;  Surgeon: Rogene Houston, MD;  [facility];  Service: Endoscopy;  Laterality: N/A;  230-moved to 145 Ann to notify pt  . ESOPHAGOGASTRODUODENOSCOPY    . IR PERC PLEURAL DRAIN W/INDWELL CATH W/IMG GUIDE  07/08/2018  . IR THORACENTESIS ASP PLEURAL SPACE W/IMG GUIDE  07/07/2018  . PLEURAL EFFUSION DRAINAGE Left 07/13/2018   Procedure: DRAINAGE OF LOCULATED PLEURAL EFFUSION;  Surgeon: Ivin Poot, MD;  Location: [place];  Service: Thoracic;  Laterality: Left;  . TALC PLEURODESIS Left 07/13/2018   Procedure: Talc Pleuradesis;  Surgeon: Prescott Gum, Collier Salina, MD;  Location: [place];  Service: Thoracic;  Laterality: Left;  . TUBAL LIGATION Bilateral   . UTERINE ABLATION    . VIDEO ASSISTED THORACOSCOPY Left 07/13/2018   Procedure: VIDEO ASSISTED THORACOSCOPY;  Surgeon: Ivin Poot, MD;  Location: [place];  Service: Thoracic;  Laterality: Left;    SOCIAL HISTORY: Social History  Socioeconomic History  . Marital status: Widowed    Spouse name: Not on file  . Number of children: 2  . Years of education: 2-College  . Highest education level: Not on file  Occupational History    Employer: [employer]  . Financial resource strain: Not hard at all  . Food insecurity    Worry: Never true    Inability: Never true  . Transportation needs    Medical: No    Non-medical: No  Tobacco Use  . Smoking status: Former Smoker    Packs/day: 0.50    Years: 17.00    Pack years: 8.50     Types: Cigarettes    Quit date: 06/22/2018    Years since quitting: 0.0  . Smokeless tobacco: Never Used  Substance and Sexual Activity  . Alcohol use: Yes    Alcohol/week: 1.0 standard drinks    Types: 1 Glasses of wine per week    Comment: Drinks alcohol on occasion  . Drug use: No  . Sexual activity: Not on file  Lifestyle  . Physical activity    Days per week: 0 days    Minutes per session: 0 min  . Stress: Very much  Relationships  . Social connections    Talks on phone: More than three times a week    Gets together: More than three times a week    Attends religious service: 1 to 4 times per year    Active member of club or organization: No    Attends meetings of clubs or organizations: Never    Relationship status: Widowed  . Intimate partner violence    Fear of current or ex partner: No    Emotionally abused: No    Physically abused: No    Forced sexual activity: No  Other Topics Concern  . Not on file  Social History Narrative   Patient lives at home with her daughter.    Patient has 2 children.    Patient is widowed.    Patient is right handed.    Patient has her Associates degree.       FAMILY HISTORY: Family History  Problem Relation Age of Onset  . Hypertension Mother   . Obesity Mother   . Diabetes Mother   . Kidney disease Mother   . Peripheral vascular disease Father   . Atrial fibrillation Father   . COPD Brother   . Osteoporosis Brother   . Crohn's disease Sister   . Cancer Sister   . Colon cancer Neg Hx     ALLERGIES:  is allergic to morphine and related; nortriptyline; topamax [topiramate]; actifed cold-allergy [chlorpheniramine-phenyleph er]; amoxicillin; codeine; erythromycin; penicillins; and sudafed [pseudoephedrine hcl].  MEDICATIONS:  Current Outpatient Medications  Medication Sig Dispense Refill  . benzonatate (TESSALON PERLES) 100 MG capsule Take 1 capsule (100 mg total) by mouth 3 (three) times daily as needed for cough. 30  capsule 0  . diazepam (VALIUM) 5 MG tablet TAKE ONE TABLET BY MOUTH AT BEDTIME AS NEEDED FOR ANXIETY (Patient taking differently: Take 5 mg by mouth at bedtime as needed for anxiety. ) 30 tablet 5  . docusate sodium (COLACE) 100 MG capsule Take 1 capsule (100 mg total) by mouth 2 (two) times daily. 60 capsule 2  . oxyCODONE 10 MG TABS Take 1 tablet (10 mg total) by mouth every 4 (four) hours as needed for severe pain or breakthrough pain. 84 tablet 0  . pantoprazole (PROTONIX) 40 MG tablet Take 1  tablet (40 mg total) by mouth 2 (two) times daily before a meal. 60 tablet 2  . promethazine (PHENERGAN) 25 MG tablet Take 1 tablet (25 mg total) by mouth every 6 (six) hours as needed for nausea, vomiting or refractory nausea / vomiting. 30 tablet 0  . senna-docusate (SENOKOT-S) 8.6-50 MG tablet Take 1 tablet by mouth at bedtime. 30 tablet 0  . SUMAtriptan (IMITREX) 100 MG tablet TAKE ONE TABLET BY MOUTH PRN UP to TWICE DAILY AS NEEDED. (Patient taking differently: Take 100 mg by mouth 2 (two) times daily as needed for migraine. ) 9 tablet 5  . traMADol (ULTRAM) 50 MG tablet Take 50 mg by mouth every 8 (eight) hours as needed for pain.     No current facility-administered medications for this visit.     REVIEW OF SYSTEMS:   Constitutional: Denies fevers, chills or abnormal night sweats Eyes: Denies blurriness of vision, double vision or watery eyes Ears, nose, mouth, throat, and face: Denies mucositis or sore throat Respiratory: Denies cough, dyspnea or wheezes Cardiovascular: Denies palpitation, chest discomfort or lower extremity swelling Gastrointestinal:  Denies nausea, heartburn or change in bowel habits Skin: Denies abnormal skin rashes Lymphatics: Denies new lymphadenopathy or easy bruising Neurological:Denies numbness, tingling or new weaknesses Behavioral/Psych: Mood is stable, no new changes  All other systems were reviewed with the patient and are negative.  PHYSICAL EXAMINATION: ECOG  PERFORMANCE STATUS: 1 - Symptomatic but completely ambulatory  Vitals:   07/22/18 1540  BP: (!) 96/44  Pulse: 96  Resp: 20  Temp: (!) 97.1 F (36.2 C)  SpO2: 98%   Filed Weights   07/22/18 1540  Weight: 163 lb 11.2 oz (74.3 kg)    GENERAL:alert, no distress and comfortable SKIN: skin color, texture, turgor are normal, no rashes or significant lesions EYES: normal, conjunctiva are pink and non-injected, sclera clear OROPHARYNX:no exudate, no erythema and lips, buccal mucosa, and tongue normal  NECK: supple, thyroid normal size, non-tender, without nodularity LYMPH:  no palpable lymphadenopathy in the cervical, axillary or inguinal LUNGS: Decreased breath sounds at the left lung base.  Right lung is clear.  Left Pleurx catheter present. HEART: regular rate & rhythm and no murmurs and no lower extremity edema ABDOMEN:abdomen soft, non-tender and normal bowel sounds Musculoskeletal:no cyanosis of digits and no clubbing  PSYCH: alert & oriented x 3 with fluent speech NEURO: no focal motor/sensory deficits  LABORATORY DATA:  I have reviewed the data as listed Lab Results  Component Value Date   WBC 14.1 (H) 07/21/2018   HGB 10.4 (L) 07/21/2018   HCT 32.3 (L) 07/21/2018   MCV 96.1 07/21/2018   PLT 312 07/21/2018     Chemistry      Component Value Date/Time   NA 135 07/21/2018 0434   K 3.6 07/21/2018 0434   CL 99 07/21/2018 0434   CO2 26 07/21/2018 0434   BUN 8 07/21/2018 0434   CREATININE 0.65 07/21/2018 0434      Component Value Date/Time   CALCIUM 8.3 (L) 07/21/2018 0434   ALKPHOS 242 (H) 07/18/2018 1425   AST 32 07/18/2018 1425   ALT 48 (H) 07/18/2018 1425   BILITOT 0.7 07/18/2018 1425       RADIOGRAPHIC STUDIES: I have personally reviewed the radiological images as listed and agreed with the findings in the report. Dg Chest 1 View  Result Date: 07/07/2018 CLINICAL DATA:  Post thoracentesis on the left. EXAM: CHEST  1 VIEW COMPARISON:  Radiographs 07/03/2018  and 06/24/2016.  CT 07/06/2018. FINDINGS: There has been partial re-expansion of the left lung following thoracentesis. There is still a sizable left pleural effusion with near complete whiteout of the left hemithorax. No residual mediastinal shift or pneumothorax identified. The right lung is clear. IMPRESSION: No pneumothorax following thoracentesis on the left. Residual sizable left pleural effusion and left lung collapse. Electronically Signed   By: Richardean Sale M.D.   On: 07/07/2018 10:31   Dg Chest 2 View  Result Date: 07/18/2018 CLINICAL DATA:  Patient with generalized weakness and fever. History of ovarian carcinoma. EXAM: CHEST - 2 VIEW COMPARISON:  Chest radiograph 07/16/2018 FINDINGS: Stable enlarged cardiac and mediastinal contours. Left chest tube remains in position. Persistent moderate left pleural effusion. Similar-appearing patchy consolidation to the left mid and upper lung. No definite pneumothorax. IMPRESSION: Left chest tube remains in position. No definite pneumothorax. Persistent moderate left pleural effusion with underlying consolidation. Electronically Signed   By: Lovey Newcomer M.D.   On: 07/18/2018 15:03   Dg Chest 2 View  Result Date: 07/16/2018 CLINICAL DATA:  Follow-up chest tube removal EXAM: CHEST - 2 VIEW COMPARISON:  07/15/2018 FINDINGS: Cardiac shadow is stable. Right-sided PICC line is again seen and stable. One of the 2 left-sided chest tubes has been removed in the interval. No pneumothorax is identified. Persistent left basilar atelectasis and effusion is seen. IMPRESSION: Interval chest tube removal on the left without recurrent pneumothorax. Stable atelectatic changes and effusion in the left base. Electronically Signed   By: Inez Catalina M.D.   On: 07/16/2018 08:29   Dg Chest 2 View  Result Date: 07/08/2018 CLINICAL DATA:  Thoracentesis, left-sided chest pain EXAM: CHEST - 2 VIEW COMPARISON:  07/07/2018 FINDINGS: No significant change in a large left pleural  effusion, with near-total consolidation of the left lung. There is no acute appearing airspace opacity of the aerated portion of the left pulmonary apex. No significant pneumothorax. The right lung is normally aerated. The cardiac silhouette is predominantly obscured. Disc degenerative disease of the thoracic spine IMPRESSION: No significant change in a large left pleural effusion, with near-total consolidation of the left lung. There is no acute appearing airspace opacity of the aerated portion of the left pulmonary apex. No significant pneumothorax. The right lung is normally aerated. Electronically Signed   By: Eddie Candle M.D.   On: 07/08/2018 09:37   Ct Chest W Contrast  Result Date: 07/09/2018 CLINICAL DATA:  Large, reaccumulating left pleural effusion EXAM: CT CHEST WITH CONTRAST TECHNIQUE: Multidetector CT imaging of the chest was performed during intravenous contrast administration. CONTRAST:  45m OMNIPAQUE IOHEXOL 300 MG/ML  SOLN COMPARISON:  Chest radiograph, 07/09/2018 FINDINGS: Cardiovascular: No significant vascular findings. Normal heart size. No pericardial effusion. Mediastinum/Nodes: There are prominent although not pathologically enlarged mediastinal lymph nodes. Moderate hiatal hernia. Thyroid gland, trachea, and esophagus demonstrate no significant findings. Lungs/Pleura: There is a moderate, loculated left hydropneumothorax with a small air component and moderate fluid component. The largest loculated component is located posteriorly. There is a pigtail drainage catheter about the lateral pleural space. There is a small right pleural effusion with associated atelectasis or consolidation and a subpleural consolidation of the superior segment right lower lobe (series 4, image 56). Upper Abdomen: No acute abnormality. Musculoskeletal: No chest wall mass or suspicious bone lesions identified. IMPRESSION: 1. There is a moderate, loculated left hydropneumothorax with a small air component and  moderate fluid component. The largest loculated component is located posteriorly. There is a pigtail drainage catheter about the lateral  pleural space. There is no obvious etiology, such as obvious mass or pleural disease. 2. There is a small right pleural effusion with associated atelectasis or consolidation and a subpleural consolidation of the superior segment right lower lobe (series 4, image 56), of uncertain significance, possibly infectious or inflammatory. Electronically Signed   By: Eddie Candle M.D.   On: 07/09/2018 11:31   Ct Abdomen Pelvis W Contrast  Result Date: 07/10/2018 CLINICAL DATA:  Large left pleural effusion, suspect gyn primary malignancy EXAM: CT ABDOMEN AND PELVIS WITH CONTRAST TECHNIQUE: Multidetector CT imaging of the abdomen and pelvis was performed using the standard protocol following bolus administration of intravenous contrast. CONTRAST:  174m OMNIPAQUE IOHEXOL 300 MG/ML  SOLN COMPARISON:  CT chest, 07/09/2018, CT abdomen pelvis, 05/10/2011 FINDINGS: Lower chest: Loculated left-sided pleural effusion with left-sided pleural drainage catheter in position. Small right pleural effusion. Hepatobiliary: No focal liver abnormality is seen. Status post cholecystectomy. No biliary dilatation. Pancreas: Unremarkable. No pancreatic ductal dilatation or surrounding inflammatory changes. Spleen: Normal in size without significant abnormality. Adrenals/Urinary Tract: Adrenal glands are unremarkable. Kidneys are normal, without renal calculi, solid lesion, or hydronephrosis. Bladder is unremarkable. Stomach/Bowel: Stomach is within normal limits. Appendix appears normal. No evidence of bowel wall thickening, distention, or inflammatory changes. Large burden of stool in the colon. Vascular/Lymphatic: No significant vascular findings are present. No enlarged abdominal or pelvic lymph nodes. Reproductive: The bilateral ovaries are enlarged by heterogeneous appearing cystic lesions, measuring 5.3  x 4.2 cm on the right (series 4, image 72) and 4.5 x 3.2 cm on the left (series 4, image 75). Other: No abdominal wall hernia or abnormality. Trace ascites. There is some suggestion of omental and peritoneal nodularity (e.g. Series 4, image 43). Musculoskeletal: No acute or significant osseous findings. IMPRESSION: 1. The bilateral ovaries are enlarged by heterogeneous appearing cystic lesions, measuring 5.3 x 4.2 cm on the right (series 4, image 72) and 4.5 x 3.2 cm on the left (series 4, image 75). Consider dedicated pelvic ultrasound and/or pelvic MRI to further evaluate for solid components given high suspicion for GYN primary malignancy. 2. No other evidence of mass and no lymphadenopathy in the abdomen or pelvis. 3. Trace ascites. There is some suggestion of omental and peritoneal nodularity (e.g. Series 4, image 475, concerning for peritoneal metastatic disease. 4. Loculated left-sided pleural effusion with left-sided pleural drainage catheter in position. Small right pleural effusion. Electronically Signed   By: AEddie CandleM.D.   On: 07/10/2018 21:14   Dg Chest Port 1 View  Result Date: 07/15/2018 CLINICAL DATA:  Chest 2 EXAM: PORTABLE CHEST 1 VIEW COMPARISON:  Portable exam [id number] compared to 07/14/2018 FINDINGS: Pair of LEFT thoracostomy tubes are again identified. RIGHT arm PICC line tip projects over SVC. Upper normal heart size. Mediastinal contours and pulmonary vascularity normal. Minimal pleural effusion at RIGHT base. Tiny LEFT apex pneumothorax. Decreased atelectasis LEFT lower lobe. Remaining lungs clear. IMPRESSION: Minimal LEFT pleural effusion and tiny LEFT pneumothorax despite thoracostomy tubes. Decreased atelectasis LEFT lower lobe. Electronically Signed   By: MLavonia DanaM.D.   On: 07/15/2018 08:50   Dg Chest Port 1 View  Result Date: 07/14/2018 CLINICAL DATA:  Follow-up pleural effusion EXAM: PORTABLE CHEST 1 VIEW COMPARISON:  07/13/2010 FINDINGS: Cardiac shadow is stable. Two  chest tubes are again identified on the left. No definitive pneumothorax is identified. Mild haziness is noted at the left base consistent with small residual effusion. Left retrocardiac atelectasis is again noted. Right-sided PICC line is  seen in satisfactory position. IMPRESSION: Stable chest tubes on the left. No pneumothorax is noted. Persistent basilar density is noted consistent with atelectasis and small effusion. Electronically Signed   By: Inez Catalina M.D.   On: 07/14/2018 07:06   Dg Chest Port 1 View  Result Date: 07/13/2018 CLINICAL DATA:  Status post left-sided VATS. EXAM: PORTABLE CHEST 1 VIEW COMPARISON:  July 13, 2018 FINDINGS: The patient has been extubated. The right-sided PICC line is well position. Two left-sided chest tubes are in place. There is no large pneumothorax. Subcutaneous gas is noted along the patient's left flank. A dense retrocardiac opacity is noted. There are small bilateral pleural effusions. No right-sided pneumothorax. No acute osseous abnormality. IMPRESSION: 1. Status post extubation.  Remaining lines and tubes as above. 2. Persistent dense retrocardiac opacity, stable from prior study. 3. Subcutaneous gas along the patient's left flank, slightly increased from prior study. Electronically Signed   By: Constance Holster M.D.   On: 07/13/2018 21:45   Dg Chest Port 1 View  Addendum Date: 07/13/2018   ADDENDUM REPORT: 07/13/2018 18:14 ADDENDUM: The patient's RN reports that the patient has been extubated at this time. Electronically Signed   By: Genevie Ann M.D.   On: 07/13/2018 18:14   Result Date: 07/13/2018 CLINICAL DATA:  48 year old female status post left VATS today for loculated effusion. Postoperative day zero. EXAM: PORTABLE CHEST 1 VIEW COMPARISON:  07/11/2018 and earlier. FINDINGS: Portable AP supine view at 1801 hours. Two left chest tubes are in place. Both tubes course toward the medial left apex. No pneumothorax identified. Endotracheal tube in place with  evidence of a left mainstem bronchus intubation. Stable right IJ PICC line. Lower lung volumes. Stable cardiac size and mediastinal contours. Regressed but not fully resolved dense left lung base opacity. Negative visible bowel gas pattern. Stable cholecystectomy clips. IMPRESSION: 1. Left mainstem bronchus intubation suspected. Withdrawal the ET tube 2 cm and repeat a portable chest. 2. Two left chest tubes in place. No pneumothorax identified. 3. Lower lung volumes.  Mildly improved left lung base ventilation. Electronically Signed: By: Genevie Ann M.D. On: 07/13/2018 18:01   Dg Chest Port 1 View  Result Date: 07/11/2018 CLINICAL DATA:  48 year old female with a history of left-sided pleural effusion. EXAM: PORTABLE CHEST 1 VIEW COMPARISON:  07/10/2018, 07/09/2018, CT 07/09/2010 FINDINGS: Cardiomediastinal silhouette unchanged with the left heart border partially obscured by lung/pleural disease. Unchanged left thoracostomy tube. Contour of the medial aspect of the left upper lobe is visualized at the apex, the so-called "luftsichel sign", new from the comparison and representing partial collapse of the left upper lobe. Right lung relatively well aerated. No new confluent airspace disease Unchanged right upper extremity PICC. IMPRESSION: Unchanged left-sided thoracostomy tube, with hydropneumothorax. The fluid component is relatively unchanged at the left lung base, with new left upper lobe collapse and small gas component at the apex. Opacity at the left lung base likely a combination of pleural fluid/loculated pleural fluid and associated atelectasis/consolidation. Unchanged right upper extremity PICC. Electronically Signed   By: Corrie Mckusick D.O.   On: 07/11/2018 08:53   Dg Chest Port 1 View  Result Date: 07/10/2018 CLINICAL DATA:  Pleural effusion EXAM: PORTABLE CHEST 1 VIEW COMPARISON:  Chest CT and chest radiograph July 09, 2018 FINDINGS: Chest tube position is unchanged. No pneumothorax is demonstrable  by radiography. Central catheter tip is in the superior vena cava. Moderate pleural effusion persists on the left with compressive atelectasis in the left lower lobe. No  new opacity evident on either side. Heart is upper normal in size, stable, with pulmonary vascularity within normal limits. No adenopathy. No bone lesions. IMPRESSION: Persistent left pleural effusion with left base compressive atelectasis. No new opacity evident. Stable cardiac silhouette. Tube and catheter positions unchanged without demonstrable pneumothorax by radiography. Electronically Signed   By: Lowella Grip III M.D.   On: 07/10/2018 09:33   Dg Chest Port 1 View  Result Date: 07/09/2018 CLINICAL DATA:  Left pleural effusion.  Chest tube in place. EXAM: PORTABLE CHEST 1 VIEW COMPARISON:  PA and lateral chest 07/08/2018. FINDINGS: New right PICC is identified with its tip projecting at the superior cavoatrial junction. The patient also has a new pigtail catheter in the left chest. Small to moderate left pleural effusion is decreased compared to the prior examination. No pneumothorax. Left basilar airspace disease persists. Small focus of discoid atelectasis right mid lung noted. Heart size is upper normal. IMPRESSION: Decreased left pleural effusion with a new chest tube in place. Negative for pneumothorax. Electronically Signed   By: Inge Rise M.D.   On: 07/09/2018 09:14   Dg Chest Port 1 View  Result Date: 07/07/2018 CLINICAL DATA:  Left-sided pleural effusion status post thoracentesis EXAM: PORTABLE CHEST 1 VIEW COMPARISON:  July 07, 2018 FINDINGS: There is a persistent large left-sided pleural effusion. There is no pneumothorax. The heart is difficult to fully evaluate on this exam. The right lung field is mostly clear. There is some mild volume overload. IMPRESSION: Interval decrease in size of the previously demonstrated left-sided pleural effusion. There is a persistent large left-sided pleural effusion without  evidence of a pneumothorax. Electronically Signed   By: Constance Holster M.D.   On: 07/07/2018 23:33   Ir Picc Placement Right >5 Yrs Inc Img Guide  Result Date: 07/08/2018 CLINICAL DATA:  Large left pleural effusion, poor IV access EXAM: PICC PLACEMENT WITH ULTRASOUND AND FLUOROSCOPY FLUOROSCOPY TIME:  18 seconds; 3 mGy TECHNIQUE: After written informed consent was obtained, patient was placed in the supine position on angiographic table. Patency of the right brachial vein was confirmed with ultrasound with image documentation. An appropriate skin site was determined. Skin site was marked. Region was prepped using maximum barrier technique including cap and mask, sterile gown, sterile gloves, large sterile sheet, and Chlorhexidine as cutaneous antisepsis. The region was infiltrated locally with 1% lidocaine. Under real-time ultrasound guidance, the right brachial vein was accessed with a 21 gauge micropuncture needle; the needle tip within the vein was confirmed with ultrasound image documentation. Needle exchanged over a 018 guidewire for a peel-away sheath, through which a 5-French double-lumen power injectable PICC trimmed to 38cm was advanced, positioned with its tip near the cavoatrial junction. Spot chest radiograph confirms appropriate catheter position. Catheter was flushed per protocol and secured externally. The patient tolerated procedure well. COMPLICATIONS: COMPLICATIONS none IMPRESSION: 1. Technically successful five [place] double lumen power injectable PICC placement Electronically Signed   By: Lucrezia Europe M.D.   On: 07/08/2018 16:40   Ir Thoracentesis Asp Pleural Space W/img Guide  Result Date: 07/07/2018 INDICATION: Patient with history of migraines, tobacco abuse and new onset dyspnea with large left pleural effusion of uncertain etiology. Request for diagnostic and therapeutic left side thoracentesis today in IR. EXAM: ULTRASOUND GUIDED LEFT THORACENTESIS MEDICATIONS: 13 mL 1% lidocaine  COMPLICATIONS: None immediate. PROCEDURE: An ultrasound guided thoracentesis was thoroughly discussed with the patient and questions answered. The benefits, risks, alternatives and complications were also discussed. The patient understands and wishes  to proceed with the procedure. Written consent was obtained. Ultrasound was performed to localize and mark an adequate pocket of fluid in the left chest. The area was then prepped and draped in the normal sterile fashion. 1% Lidocaine was used for local anesthesia. Under ultrasound guidance a 6 Fr Safe-T-Centesis catheter was introduced. Thoracentesis was performed. The catheter was removed and a dressing applied. FINDINGS: A total of approximately 2.0 L of golden yellow, blood tinged fluid was removed. Samples were sent to the laboratory as requested by the clinical team. IMPRESSION: Successful ultrasound guided left thoracentesis yielding 2.0 L of pleural fluid. Read by Candiss Norse, PA-C Electronically Signed   By: Sandi Mariscal M.D.   On: 07/07/2018 10:36   Ir Perc Pleural Drain W/indwell Cath W/img Guide  Result Date: 07/08/2018 CLINICAL DATA:  Large left pleural effusion EXAM: LEFT CHEST TUBE PLACEMENT UNDER ULTRASOUND AND FLUOROSCOPIC GUIDANCE ANESTHESIA/SEDATION: Intravenous Fentanyl 139mg and Versed 466mwere administered as conscious sedation during continuous monitoring of the patient's level of consciousness and physiological / cardiorespiratory status by the radiology RN, with a total moderate sedation time of 49 minutes. PROCEDURE: The procedure, risks, benefits, and alternatives were explained to the patient. Questions regarding the procedure were encouraged and answered. The patient understands and consents to the procedure. appropriate skin entry site was determined with ultrasound and marked. The operative field was prepped with chlorhexidinein a sterile fashion, and a sterile drape was applied covering the operative field. A sterile gown and  sterile gloves were used for the procedure. Local anesthesia was provided with 1% Lidocaine. Under ultrasound guidance, a 19 gauge percutaneous entry needle advanced into the left pleural space from a lateral approach. Blood-tinged fluid could be aspirated. Amplatz guidewire advanced towards the apex under fluoroscopy. Tract dilated to facilitate placement of 14 French pigtail catheter, directed towards the apex. Catheter secured externally with 0 Prolene suture and placed to Pleur-evac. 500 mL returned spontaneously into the canister without suction. Site was covered with a sterile dressing the patient returned to her room in good condition. COMPLICATIONS: None immediate FINDINGS: Large left pleural effusion was noted. 14 French pigtail catheter placed as above. IMPRESSION: 1. Technically successful placement of left 14 French pigtail chest drain, placed to Pleur-evac water-seal. Electronically Signed   By: D Lucrezia Europe.D.   On: 07/08/2018 16:42    ASSESSMENT & PLAN:  Ovarian cancer, bilateral ([place].  Presumed to ovarian cancer, clinical stage IVa: - She developed shortness of breath on exertion in mid June.  She was seen by PMD Dr. VyWoody Sellerho ordered a chest x-ray followed by CT [facility] for left pleural effusion.  She underwent left VATS on 07/13/2018 with talc pleurodesis and placement of Pleurx catheter. - Cytology of the left pleural fluid on 07/07/2018 shows malignant cells consistent with adenocarcinoma.  The cells are positive for CK7, p53, WT 1, PAX 8, ER (weak) and EMA but negative for CK20, TTF-1, Gata 3, CDX 2 and D2-40.  Overall phenotype was consistent with gynecological primary.  Left pleural biopsy on 07/13/2018 shows fibrinous pleuritis, negative for malignancy. - [facility] found to have a hemoglobin of 4.1 and received 4 units of PRBC and 1 unit of  FFP on 07/18/2018.  She was discharged on 07/21/2018. -EGD on 07/20/2018 showed grade C reflux esophagitis, peptic ulcer disease and prepyloric ulcer with no active bleeding. - CTAP  on 07/10/2018 shows bilateral ovarian masses, trace ascites, some nodularity in the omentum and peritoneum.  No adenopathy was seen.  Ca1 25 was elevated at 581. - I have recommended GYN oncology evaluation.  We will make a referral to Dr. Denman George. -Patient is adamant that she is not going to have any chemotherapy.  This is based on her husband's experience with metastatic lung cancer. -I have recommended germline mutation testing.  Only other family members with cancer was maternal aunt who died of breast cancer.  2.  Left chest wall pain: - She is currently taking 2 tablets of oxycodone every 4 hours which is helping. -I have given a prescription for oxycodone 10 mg every 4 hours as needed.  Orders Placed This Encounter  Procedures  . NM PET Image Initial (PI) Skull Base To Thigh    Standing Status:   Future    Standing Expiration Date:   07/22/2019    Order Specific Question:   ** REASON FOR EXAM (FREE TEXT)    Answer:   ovarian cancer    Order Specific Question:   If indicated for the ordered procedure, I authorize the administration of a radiopharmaceutical per Radiology protocol    Answer:   Yes    Order Specific Question:   Is the patient pregnant?    Answer:   No    Order Specific Question:   Preferred imaging location?    Answer:   [facility]    Order Specific Question:   Radiology Contrast Protocol - do NOT remove file path    Answer:   \\[email or web address]    All questions were answered. The patient knows to call the clinic with any problems, questions or concerns.      Derek Jack, MD 07/22/2018 6:36 PM

## 2018-07-23 ENCOUNTER — Telehealth (HOSPITAL_COMMUNITY): Payer: Self-pay | Admitting: Hematology

## 2018-07-23 ENCOUNTER — Encounter (HOSPITAL_COMMUNITY): Payer: Self-pay | Admitting: Internal Medicine

## 2018-07-23 DIAGNOSIS — Z48813 Encounter for surgical aftercare following surgery on the respiratory system: Secondary | ICD-10-CM | POA: Diagnosis not present

## 2018-07-23 LAB — CULTURE, BLOOD (ROUTINE X 2)
Culture: NO GROWTH
Culture: NO GROWTH
Special Requests: ADEQUATE

## 2018-07-23 NOTE — Telephone Encounter (Signed)
pc to pt to discuss financial app, Alight found and etc. Pt stated she just got out of the hospital and is tired etc. Asked if she would like to talk on her next visit. She stated she would.

## 2018-07-25 ENCOUNTER — Other Ambulatory Visit: Payer: Self-pay | Admitting: Neurology

## 2018-07-27 NOTE — Telephone Encounter (Signed)
[facility] Verified LR: 06-16-2018  Qty:30 Pending appointment: 7-29-2020Butler Denmark, NP)

## 2018-07-29 ENCOUNTER — Ambulatory Visit: Payer: Self-pay | Admitting: Cardiothoracic Surgery

## 2018-07-29 ENCOUNTER — Other Ambulatory Visit (HOSPITAL_COMMUNITY): Payer: Self-pay | Admitting: Emergency Medicine

## 2018-07-29 DIAGNOSIS — C561 Malignant neoplasm of right ovary: Secondary | ICD-10-CM

## 2018-07-29 DIAGNOSIS — C563 Malignant neoplasm of bilateral ovaries: Secondary | ICD-10-CM

## 2018-07-30 ENCOUNTER — Other Ambulatory Visit (HOSPITAL_COMMUNITY): Payer: Self-pay

## 2018-07-30 ENCOUNTER — Inpatient Hospital Stay (HOSPITAL_BASED_OUTPATIENT_CLINIC_OR_DEPARTMENT_OTHER): Payer: Medicaid Other | Admitting: Genetic Counselor

## 2018-07-30 ENCOUNTER — Encounter (HOSPITAL_COMMUNITY): Payer: Self-pay | Admitting: Genetic Counselor

## 2018-07-30 ENCOUNTER — Other Ambulatory Visit: Payer: Self-pay

## 2018-07-30 DIAGNOSIS — C563 Malignant neoplasm of bilateral ovaries: Secondary | ICD-10-CM

## 2018-07-30 DIAGNOSIS — Z8049 Family history of malignant neoplasm of other genital organs: Secondary | ICD-10-CM | POA: Insufficient documentation

## 2018-07-30 DIAGNOSIS — Z803 Family history of malignant neoplasm of breast: Secondary | ICD-10-CM | POA: Diagnosis not present

## 2018-07-30 DIAGNOSIS — C561 Malignant neoplasm of right ovary: Secondary | ICD-10-CM

## 2018-07-30 DIAGNOSIS — C562 Malignant neoplasm of left ovary: Secondary | ICD-10-CM | POA: Diagnosis not present

## 2018-07-30 NOTE — Progress Notes (Signed)
REFERRING PROVIDER: Derek Jack, MD [address] [place],   [address]  PRIMARY PROVIDER:  [name], MD  PRIMARY REASON FOR VISIT:  1. Ovarian cancer, bilateral (Michelle Holt)   2. Family history of uterine cancer   3. Family history of breast cancer      HISTORY OF PRESENT ILLNESS:   Michelle Holt, a 48 y.o. female, was seen for a [facility] cancer genetics consultation at the request of Dr. Delton Coombes due to a personal and family history of cancer.  Michelle Holt presents to clinic today to discuss the possibility of a hereditary predisposition to cancer, genetic testing, and to further clarify her future cancer risks, as well as potential cancer risks for family members.   In June 2020, at the age of 24, Michelle Holt was diagnosed with bilateral cancer of the ovary.  The treatment plan includes a PET scan next week, and she will talk with Dr. Raliegh Ip about her plan after that.     CANCER HISTORY:  Oncology History  Ovarian cancer, bilateral ([place])  07/07/2018 Pathology Results   PLEURAL FLUID, LEFT (SPECIMEN 1 OF 1, COLLECTED 07/07/18): - MALIGNANT CELLS CONSISTENT WITH ADENOCARCINOMA - SEE COMMENT  Source Pleural Fluid, (Specimen 1 of 1, collected on 07/07/2018) Gross Specimen: Received is/are 1000cc of bloody red fluid with tissue. (TC:tc) Prepared: # Smears: 0 # Concentration Technique Slides (i.e. ThinPrep): 1 # Cell Block: 1 Conventional Additional Studies: Two Hematology slides labeled [id number] Comment The malignant cells are positive for cytokeratin 7, p53, WT-1, Pax-8, Moc31, ER (weak) and EMA but negative for cytokeratin 20, TTF-1, GATA-3, CDX-2 and D2-40. Overall, the phenotype is consistent with a gynecologic primary; clinical correlation recommended.   07/07/2018 Procedure   Successful ultrasound guided left thoracentesis yielding 2.0 L of pleural fluid   07/08/2018 Procedure   1. Technically successful placement of left 14 French pigtail chest drain, placed to  Pleur-evac water-seal.   07/08/2018 Procedure   1. Technically successful five [place] double lumen power injectable PICC placement   07/09/2018 Imaging   Ct chest 1. There is a moderate, loculated left hydropneumothorax with a small air component and moderate fluid component. The largest loculated component is located posteriorly. There is a pigtail drainage catheter about the lateral pleural space. There is no obvious etiology, such as obvious mass or pleural disease.   2. There is a small right pleural effusion with associated atelectasis or consolidation and a subpleural consolidation of the superior segment right lower lobe (series 4, image 56), of uncertain significance, possibly infectious or inflammatory   07/10/2018 Imaging   Ct abdomen and pelvis: 1. The bilateral ovaries are enlarged by heterogeneous appearing cystic lesions, measuring 5.3 x 4.2 cm on the right (series 4, image 72) and 4.5 x 3.2 cm on the left (series 4, image 75). Consider dedicated pelvic ultrasound and/or pelvic MRI to further evaluate for solid components given high suspicion for GYN primary malignancy.   2. No other evidence of mass and no lymphadenopathy in the abdomen or pelvis.   3. Trace ascites. There is some suggestion of omental and peritoneal nodularity (e.g. Series 4, image 80), concerning for peritoneal metastatic disease.    4. Loculated left-sided pleural effusion with left-sided pleural drainage catheter in position. Small right pleural effusion   07/13/2018 Surgery   OPERATION: 1.  Left VATS (video-assisted thoracoscopic surgery) for drainage of loculated pleural effusion. 2.  Talc pleurodesis for malignant pleural effusion. 3.  Placement of PleurX catheter for management of malignant  pleural effusion. 4.  Placement of On-Q analgesia catheter system.    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   POSTOPERATIVE DIAGNOSIS:   Large malignant left pleural effusion, probable adenocarcinoma of the ovary by cytology.   07/13/2018 Pathology Results   Pleura, peel, Left Pleural - FIBRO-FIBRINOUS PLEURITIS - NEGATIVE FOR MALIGNANCY   07/18/2018 Initial Diagnosis   Ovarian cancer, bilateral ([place])   07/20/2018 Procedure   EGD impression: Normal proximal esophagus and mid esophagus. Mild distal esophageal rings; dilation not performed because of esophagitis. LA Grade C reflux esophagitis. Z-line regular, 30 cm from the incisors. 5 cm hiatal hernia. Non-bleeding gastric ulcer with no stigmata of bleeding. Gastritis. Duodenal erosions without bleeding. Normal second portion of the duodenum. No specimens collected.      RISK FACTORS:  Menarche was at age 62.  First live birth at age 56.  Ovaries intact: yes.  Hysterectomy: no.  Menopausal status: perimenopausal.  HRT use: 0 years. Colonoscopy: yes; normal. Mammogram within the last year: yes. Number of breast biopsies: 0. Up to date with pelvic exams: no. Any excessive radiation exposure in the past: no  Past Medical History:  Diagnosis Date   Anxiety and depression    Arthritis of facet joints at multiple vertebral levels    L5-S1   Constipation    Dyslipidemia    Family history of breast cancer    Family history of uterine cancer    Insomnia    Irritable bowel syndrome    Migraine    Muscle tension headache    Plantar fasciitis of right foot     Past Surgical History:  Procedure Laterality Date   CHOLECYSTECTOMY  2008   COLONOSCOPY N/A 08/13/2013   Procedure: COLONOSCOPY;  Surgeon: Rogene Houston, MD;  [facility];  Service: Endoscopy;  Laterality: N/A;  230-moved to 145 Ann to notify pt   ESOPHAGOGASTRODUODENOSCOPY     ESOPHAGOGASTRODUODENOSCOPY (EGD) WITH PROPOFOL N/A 07/20/2018   Procedure: ESOPHAGOGASTRODUODENOSCOPY (EGD) WITH PROPOFOL;  Surgeon: Rogene Houston, MD;  [facility];  Service: Endoscopy;   Laterality: N/A;  Possible esophageal dilation.   IR PERC PLEURAL DRAIN W/INDWELL CATH W/IMG GUIDE  07/08/2018   IR THORACENTESIS ASP PLEURAL SPACE W/IMG GUIDE  07/07/2018   PLEURAL EFFUSION DRAINAGE Left 07/13/2018   Procedure: DRAINAGE OF LOCULATED PLEURAL EFFUSION;  Surgeon: Ivin Poot, MD;  Location: [place];  Service: Thoracic;  Laterality: Left;   TALC PLEURODESIS Left 07/13/2018   Procedure: Talc Pleuradesis;  Surgeon: Prescott Gum, Collier Salina, MD;  Location: [place];  Service: Thoracic;  Laterality: Left;   TUBAL LIGATION Bilateral    UTERINE ABLATION     VIDEO ASSISTED THORACOSCOPY Left 07/13/2018   Procedure: VIDEO ASSISTED THORACOSCOPY;  Surgeon: Ivin Poot, MD;  Location: [facility] OR;  Service: Thoracic;  Laterality: Left;    Social History   Socioeconomic History   Marital status: Widowed    Spouse name: Not on file   Number of children: 2   Years of education: 2-College   Highest education level: Not on file  Occupational History    Employer: [employer]  Social Needs   Financial resource strain: Not hard at all   Food insecurity    Worry: Never true    Inability: Never true   Transportation needs    Medical: No    Non-medical: No  Tobacco Use   Smoking status: Former Smoker    Packs/day: 0.50  Years: 17.00    Pack years: 8.50    Types: Cigarettes    Quit date: 06/22/2018    Years since quitting: 0.1   Smokeless tobacco: Never Used  Substance and Sexual Activity   Alcohol use: Yes    Alcohol/week: 1.0 standard drinks    Types: 1 Glasses of wine per week    Comment: Drinks alcohol on occasion   Drug use: No   Sexual activity: Not on file  Lifestyle   Physical activity    Days per week: 0 days    Minutes per session: 0 min   Stress: Very much  Relationships   Social connections    Talks on phone: More than three times a week    Gets together: More than three times a week    Attends religious service: 1 to 4 times per year    Active member of  club or organization: No    Attends meetings of clubs or organizations: Never    Relationship status: Widowed  Other Topics Concern   Not on file  Social History Narrative   Patient lives at home with her daughter.    Patient has 2 children.    Patient is widowed.    Patient is right handed.    Patient has her Associates degree.        FAMILY HISTORY:  We obtained a detailed, 4-generation family history.  Significant diagnoses are listed below: Family History  Problem Relation Age of Onset   Hypertension Mother    Obesity Mother    Diabetes Mother    Kidney disease Mother    Peripheral vascular disease Father    Atrial fibrillation Father    COPD Brother    Osteoporosis Brother    Crohn's disease Sister    Uterine cancer Sister 33       maternal half sister   Breast cancer Maternal Aunt 32   Colon cancer Neg Hx     The patient has a son and daughter who are cancer free. She has two full brothers and one full sister who are cancer free.  She has three paternal half sisters who are cancer free.  Additionally, there are four maternal half sisters and a maternal half brother, one sister had uterine cancer at 24.  The patient's parents are deceased.  The patient's mother died of renal failure at 26.  She had two brothers and a sister.  Her sister had breast cancer around age 83.  The maternal grandparents are deceased.  The patient's father died of congestive heart failure at 40.  The paternal grandparents are deceased from non cancer related issues.  Michelle Holt is unaware of previous family history of genetic testing for hereditary cancer risks. Patient's maternal ancestors are of Caucasian and [place] descent, and paternal ancestors are of Caucasian descent. There is no reported Ashkenazi Jewish ancestry. There is no known consanguinity.   GENETIC COUNSELING ASSESSMENT: Michelle Holt is a 48 y.o. female with a personal and family history of cancer which is  somewhat suggestive of a hereditary cancer panel and predisposition to cancer. We, therefore, discussed and recommended the following at today's visit.   DISCUSSION: We discussed that 15 - 20% of ovarian cancer is hereditary, with most cases associated with BRCA mutations.  There are other genes that can be associated with hereditary ovarian cancer syndromes.  We discussed that testing is beneficial for several reasons including knowing how to follow individuals after completing their treatment, identifying whether potential  treatment options such as PARP inhibitors would be beneficial, and understand if other family members could be at risk for cancer and allow them to undergo genetic testing.   We discussed that we can test for germline and somatic mutations, and typically do this at the same time.  It is unknown when she will have surgery, and therefore we can offer germline testing through [facility] and if she undergoes surgery in the future can send it to [facility], or we can wait until after surgery and send both at the same time.  Somatic testing will also help determine if systemic therapy is needed.  We will wait until next week to see what her treatment plan will be in order to determine how to proceed.  We reviewed the characteristics, features and inheritance patterns of hereditary cancer syndromes. We also discussed genetic testing, including the appropriate family members to test, the process of testing, insurance coverage and turn-around-time for results. We discussed the implications of a negative, positive and/or variant of uncertain significant result. We recommended Michelle Holt pursue genetic testing for the TumorNext-HRD with CancerNext gene panel. The CancerNext gene panel offered by [employer] includes sequencing and rearrangement analysis for the following 34 genes:   APC, ATM, BARD1, BMPR1A, BRCA1, BRCA2, BRIP1, CDH1, CDK4, CDKN2A, CHEK2, DICER1, HOXB13, EPCAM, GREM1, MLH1,  MRE11A, MSH2, MSH6, MUTYH, NBN, NF1, PALB2, PMS2, POLD1, POLE, PTEN, RAD50, RAD51C, RAD51D, SMAD4, SMARCA4, STK11, and TP53.    Based on Michelle Holt's family history of cancer, she meets medical criteria for genetic testing. Despite that she meets criteria, she may still have an out of pocket cost. We discussed that if her out of pocket cost for testing is over $100, the laboratory will call and confirm whether she wants to proceed with testing.  If the out of pocket cost of testing is less than $100 she will be billed by the genetic testing laboratory.   PLAN: Michelle Holt decided to wait until after next week when her PET scan will be completed.  Once complete, she will have more information on her treatment plan.  Michelle Holt did not wish to pursue genetic testing at today's visit.    Lastly, we encouraged Michelle Holt to remain in contact with cancer genetics annually so that we can continuously update the family history and inform her of any changes in cancer genetics and testing that may be of benefit for this family.   Michelle Holt's questions were answered to her satisfaction today. Our contact information was provided should additional questions or concerns arise. Thank you for the referral and allowing [place] to share in the care of your patient.   Harbor Paster P. Florene Glen, [place], [facility] Certified Genetic Counselor [email or web address] phone: [phone]  The patient was seen for a total of 45 minutes in face-to-face genetic counseling.  This patient was discussed with Drs. Magrinat, Lindi Adie and/or Burr Medico who agrees with the above.    _______________________________________________________________________ For Office Staff:  Number of people involved in session: 1 Was an Intern/ student involved with case: no

## 2018-07-31 ENCOUNTER — Emergency Department (HOSPITAL_COMMUNITY): Payer: Medicaid Other

## 2018-07-31 ENCOUNTER — Encounter (HOSPITAL_COMMUNITY): Payer: Self-pay | Admitting: *Deleted

## 2018-07-31 ENCOUNTER — Inpatient Hospital Stay (HOSPITAL_COMMUNITY)
Admission: EM | Admit: 2018-07-31 | Discharge: 2018-08-04 | DRG: 981 | Disposition: A | Discharge status: Home Health | Payer: Medicaid Other | Attending: Student | Admitting: Student

## 2018-07-31 ENCOUNTER — Other Ambulatory Visit: Payer: Self-pay

## 2018-07-31 ENCOUNTER — Ambulatory Visit (HOSPITAL_COMMUNITY)
Admission: RE | Admit: 2018-07-31 | Discharge: 2018-07-31 | Disposition: A | Discharge status: Home / Self Care | Payer: Medicaid Other | Source: Ambulatory Visit | Attending: Hematology | Admitting: Hematology

## 2018-07-31 DIAGNOSIS — F4320 Adjustment disorder, unspecified: Secondary | ICD-10-CM | POA: Diagnosis not present

## 2018-07-31 DIAGNOSIS — Z23 Encounter for immunization: Secondary | ICD-10-CM

## 2018-07-31 DIAGNOSIS — G479 Sleep disorder, unspecified: Secondary | ICD-10-CM | POA: Diagnosis present

## 2018-07-31 DIAGNOSIS — Z803 Family history of malignant neoplasm of breast: Secondary | ICD-10-CM

## 2018-07-31 DIAGNOSIS — G8912 Acute post-thoracotomy pain: Secondary | ICD-10-CM | POA: Diagnosis not present

## 2018-07-31 DIAGNOSIS — Z1159 Encounter for screening for other viral diseases: Secondary | ICD-10-CM

## 2018-07-31 DIAGNOSIS — Z720 Tobacco use: Secondary | ICD-10-CM

## 2018-07-31 DIAGNOSIS — K5900 Constipation, unspecified: Secondary | ICD-10-CM | POA: Diagnosis present

## 2018-07-31 DIAGNOSIS — Z888 Allergy status to other drugs, medicaments and biological substances status: Secondary | ICD-10-CM

## 2018-07-31 DIAGNOSIS — Z79891 Long term (current) use of opiate analgesic: Secondary | ICD-10-CM

## 2018-07-31 DIAGNOSIS — K264 Chronic or unspecified duodenal ulcer with hemorrhage: Secondary | ICD-10-CM | POA: Diagnosis present

## 2018-07-31 DIAGNOSIS — K254 Chronic or unspecified gastric ulcer with hemorrhage: Secondary | ICD-10-CM | POA: Diagnosis present

## 2018-07-31 DIAGNOSIS — F419 Anxiety disorder, unspecified: Secondary | ICD-10-CM | POA: Diagnosis not present

## 2018-07-31 DIAGNOSIS — D62 Acute posthemorrhagic anemia: Secondary | ICD-10-CM | POA: Diagnosis present

## 2018-07-31 DIAGNOSIS — E785 Hyperlipidemia, unspecified: Secondary | ICD-10-CM | POA: Diagnosis present

## 2018-07-31 DIAGNOSIS — Z79899 Other long term (current) drug therapy: Secondary | ICD-10-CM | POA: Diagnosis not present

## 2018-07-31 DIAGNOSIS — K649 Unspecified hemorrhoids: Secondary | ICD-10-CM | POA: Diagnosis present

## 2018-07-31 DIAGNOSIS — S3609XA Other injury of spleen, initial encounter: Principal | ICD-10-CM | POA: Diagnosis present

## 2018-07-31 DIAGNOSIS — C561 Malignant neoplasm of right ovary: Secondary | ICD-10-CM | POA: Diagnosis not present

## 2018-07-31 DIAGNOSIS — Z880 Allergy status to penicillin: Secondary | ICD-10-CM

## 2018-07-31 DIAGNOSIS — K589 Irritable bowel syndrome without diarrhea: Secondary | ICD-10-CM | POA: Diagnosis present

## 2018-07-31 DIAGNOSIS — Z833 Family history of diabetes mellitus: Secondary | ICD-10-CM

## 2018-07-31 DIAGNOSIS — S36039D Unspecified laceration of spleen, subsequent encounter: Secondary | ICD-10-CM | POA: Diagnosis not present

## 2018-07-31 DIAGNOSIS — Q8901 Asplenia (congenital): Secondary | ICD-10-CM | POA: Diagnosis not present

## 2018-07-31 DIAGNOSIS — F4322 Adjustment disorder with anxiety: Secondary | ICD-10-CM | POA: Diagnosis present

## 2018-07-31 DIAGNOSIS — I454 Nonspecific intraventricular block: Secondary | ICD-10-CM | POA: Diagnosis not present

## 2018-07-31 DIAGNOSIS — S36039A Unspecified laceration of spleen, initial encounter: Secondary | ICD-10-CM | POA: Diagnosis not present

## 2018-07-31 DIAGNOSIS — F418 Other specified anxiety disorders: Secondary | ICD-10-CM | POA: Diagnosis present

## 2018-07-31 DIAGNOSIS — C563 Malignant neoplasm of bilateral ovaries: Secondary | ICD-10-CM | POA: Diagnosis present

## 2018-07-31 DIAGNOSIS — G43909 Migraine, unspecified, not intractable, without status migrainosus: Secondary | ICD-10-CM | POA: Diagnosis present

## 2018-07-31 DIAGNOSIS — Z9689 Presence of other specified functional implants: Secondary | ICD-10-CM

## 2018-07-31 DIAGNOSIS — Z8262 Family history of osteoporosis: Secondary | ICD-10-CM

## 2018-07-31 DIAGNOSIS — Z841 Family history of disorders of kidney and ureter: Secondary | ICD-10-CM

## 2018-07-31 DIAGNOSIS — Z9049 Acquired absence of other specified parts of digestive tract: Secondary | ICD-10-CM

## 2018-07-31 DIAGNOSIS — K210 Gastro-esophageal reflux disease with esophagitis: Secondary | ICD-10-CM | POA: Diagnosis present

## 2018-07-31 DIAGNOSIS — G4700 Insomnia, unspecified: Secondary | ICD-10-CM | POA: Diagnosis present

## 2018-07-31 DIAGNOSIS — R079 Chest pain, unspecified: Secondary | ICD-10-CM | POA: Diagnosis present

## 2018-07-31 DIAGNOSIS — J910 Malignant pleural effusion: Secondary | ICD-10-CM | POA: Diagnosis not present

## 2018-07-31 DIAGNOSIS — Z8249 Family history of ischemic heart disease and other diseases of the circulatory system: Secondary | ICD-10-CM

## 2018-07-31 DIAGNOSIS — C799 Secondary malignant neoplasm of unspecified site: Secondary | ICD-10-CM

## 2018-07-31 DIAGNOSIS — Z87891 Personal history of nicotine dependence: Secondary | ICD-10-CM | POA: Diagnosis not present

## 2018-07-31 DIAGNOSIS — C786 Secondary malignant neoplasm of retroperitoneum and peritoneum: Secondary | ICD-10-CM | POA: Diagnosis present

## 2018-07-31 DIAGNOSIS — C562 Malignant neoplasm of left ovary: Secondary | ICD-10-CM

## 2018-07-31 DIAGNOSIS — Z8711 Personal history of peptic ulcer disease: Secondary | ICD-10-CM

## 2018-07-31 DIAGNOSIS — Z885 Allergy status to narcotic agent status: Secondary | ICD-10-CM

## 2018-07-31 DIAGNOSIS — Z8049 Family history of malignant neoplasm of other genital organs: Secondary | ICD-10-CM

## 2018-07-31 DIAGNOSIS — Z825 Family history of asthma and other chronic lower respiratory diseases: Secondary | ICD-10-CM

## 2018-07-31 HISTORY — DX: Malignant neoplasm of unspecified ovary: C56.9

## 2018-07-31 LAB — COMPREHENSIVE METABOLIC PANEL
ALT: 27 U/L (ref 0–44)
AST: 25 U/L (ref 15–41)
Albumin: 3.2 g/dL — ABNORMAL LOW (ref 3.5–5.0)
Alkaline Phosphatase: 138 U/L — ABNORMAL HIGH (ref 38–126)
Anion gap: 10 (ref 5–15)
BUN: 9 mg/dL (ref 6–20)
CO2: 25 mmol/L (ref 22–32)
Calcium: 9 mg/dL (ref 8.9–10.3)
Chloride: 101 mmol/L (ref 98–111)
Creatinine, Ser: 0.63 mg/dL (ref 0.44–1.00)
GFR calc Af Amer: >60 mL/min (ref >60)
GFR calc non Af Amer: >60 mL/min (ref >60)
Glucose, Bld: 95 mg/dL (ref 70–99)
Potassium: 3.8 mmol/L (ref 3.5–5.1)
Sodium: 136 mmol/L (ref 135–145)
Total Bilirubin: 0.9 mg/dL (ref 0.3–1.2)
Total Protein: 7.1 g/dL (ref 6.5–8.1)

## 2018-07-31 LAB — CBC WITH DIFFERENTIAL/PLATELET
Abs Immature Granulocytes: 0.05 10*3/uL (ref 0.00–0.07)
Basophils Absolute: 0.1 10*3/uL (ref 0.0–0.1)
Basophils Relative: 1 %
Eosinophils Absolute: 0.4 10*3/uL (ref 0.0–0.5)
Eosinophils Relative: 5 %
HCT: 38.5 % (ref 36.0–46.0)
Hemoglobin: 12 g/dL (ref 12.0–15.0)
Immature Granulocytes: 1 %
Lymphocytes Relative: 18 %
Lymphs Abs: 1.3 10*3/uL (ref 0.7–4.0)
MCH: 29.7 pg (ref 26.0–34.0)
MCHC: 31.2 g/dL (ref 30.0–36.0)
MCV: 95.3 fL (ref 80.0–100.0)
Monocytes Absolute: 0.7 10*3/uL (ref 0.1–1.0)
Monocytes Relative: 10 %
Neutro Abs: 4.7 10*3/uL (ref 1.7–7.7)
Neutrophils Relative %: 65 %
Platelets: 699 10*3/uL — ABNORMAL HIGH (ref 150–400)
RBC: 4.04 MIL/uL (ref 3.87–5.11)
RDW: 14.4 % (ref 11.5–15.5)
WBC: 7.2 10*3/uL (ref 4.0–10.5)
nRBC: 0 % (ref 0.0–0.2)

## 2018-07-31 LAB — GLUCOSE, CAPILLARY: Glucose-Capillary: 102 mg/dL — ABNORMAL HIGH (ref 70–99)

## 2018-07-31 LAB — TYPE AND SCREEN
ABO/RH(D): O POS
Antibody Screen: NEGATIVE

## 2018-07-31 LAB — PROTIME-INR
INR: 1.1 (ref 0.8–1.2)
Prothrombin Time: 13.7 seconds (ref 11.4–15.2)

## 2018-07-31 LAB — SARS CORONAVIRUS 2 BY RT PCR (HOSPITAL ORDER, PERFORMED IN ~~LOC~~ HOSPITAL LAB): SARS Coronavirus 2: NEGATIVE

## 2018-07-31 MED ORDER — ONDANSETRON HCL 4 MG/2ML IJ SOLN
4.0000 mg | Freq: Four times a day (QID) | INTRAMUSCULAR | Status: DC | PRN
  Administered 2018-08-01: 4 mg via INTRAVENOUS
  Filled 2018-07-31: qty 2

## 2018-07-31 MED ORDER — FLUDEOXYGLUCOSE F - 18 (FDG) INJECTION
8.1600 | Freq: Once | INTRAVENOUS | Status: AC | PRN
  Administered 2018-07-31: 8.16 via INTRAVENOUS

## 2018-07-31 MED ORDER — DIAZEPAM 5 MG PO TABS
5.0000 mg | ORAL_TABLET | Freq: Every evening | ORAL | Status: DC | PRN
  Administered 2018-08-02 – 2018-08-03 (×3): 5 mg via ORAL
  Filled 2018-07-31 (×4): qty 1

## 2018-07-31 MED ORDER — IOHEXOL 350 MG/ML SOLN
100.0000 mL | Freq: Once | INTRAVENOUS | Status: AC | PRN
  Administered 2018-07-31: 100 mL via INTRAVENOUS

## 2018-07-31 MED ORDER — DOCUSATE SODIUM 100 MG PO CAPS
100.0000 mg | ORAL_CAPSULE | Freq: Two times a day (BID) | ORAL | Status: DC
  Administered 2018-08-01 – 2018-08-04 (×4): 100 mg via ORAL
  Filled 2018-07-31 (×5): qty 1

## 2018-07-31 MED ORDER — SODIUM CHLORIDE 0.9 % IV SOLN
INTRAVENOUS | Status: AC
  Administered 2018-07-31 (×2): via INTRAVENOUS

## 2018-07-31 MED ORDER — OXYCODONE HCL 5 MG PO TABS
10.0000 mg | ORAL_TABLET | ORAL | Status: DC | PRN
  Administered 2018-07-31 – 2018-08-04 (×19): 10 mg via ORAL
  Filled 2018-07-31 (×19): qty 2

## 2018-07-31 MED ORDER — SODIUM CHLORIDE 0.9 % IV SOLN: INTRAVENOUS | Status: DC

## 2018-07-31 MED ORDER — PANTOPRAZOLE SODIUM 40 MG PO TBEC
40.0000 mg | DELAYED_RELEASE_TABLET | Freq: Two times a day (BID) | ORAL | Status: DC
  Administered 2018-08-01 – 2018-08-04 (×8): 40 mg via ORAL
  Filled 2018-07-31 (×8): qty 1

## 2018-07-31 MED ORDER — FENTANYL CITRATE (PF) 100 MCG/2ML IJ SOLN
50.0000 ug | INTRAMUSCULAR | Status: AC | PRN
  Administered 2018-07-31 (×2): 50 ug via INTRAVENOUS
  Filled 2018-07-31 (×2): qty 2

## 2018-07-31 MED ORDER — SENNOSIDES-DOCUSATE SODIUM 8.6-50 MG PO TABS
1.0000 | ORAL_TABLET | Freq: Every day | ORAL | Status: DC
  Administered 2018-08-02: 1 via ORAL
  Filled 2018-07-31 (×2): qty 1

## 2018-07-31 MED ORDER — SODIUM CHLORIDE 0.9 % IV SOLN
INTRAVENOUS | Status: DC
  Administered 2018-07-31: 16:00:00 via INTRAVENOUS

## 2018-07-31 NOTE — Progress Notes (Signed)
Received report from RN at [facility] [place] on pt coming to [id number]. Lajoyce Corners, RN

## 2018-07-31 NOTE — ED Triage Notes (Signed)
Patient sent over by her oncologist for possible internal bleeding as seen by a "PET" scan per patient.  Patient complains of left side pain.  Patient recently diagnosed with pleural effusion.

## 2018-07-31 NOTE — ED Provider Notes (Signed)
[facility] EMERGENCY DEPARTMENT Provider Note   CSN: [id number] Arrival date & time: 07/31/18  1417     History   Chief Complaint Chief Complaint  Patient presents with   Abnormal Lab    hitory of pleural effusion    HPI Michelle Holt is a 48 y.o. female.     HPI  Pt was seen at 1515. Per pt, c/o gradual onset and persistence of constant left sided lower chest "pains" for the past 2 to 3 weeks. Pt states her pains began after she was admitted to the hospital several weeks ago and a Pleurx catheter was placed for recurrent pleural effusions. Pt states she needed a blood transfusion that admission for Hgb 4. Pt states her pain has remained constant since onset several weeks ago. Pt states she was evaluated by her Onc MD with f/u PET scan, where a small hematoma under her left diaphragm/above spleen was noted. Pt was then sent to the ED for further evaluation. Pt denies abd pain, no back pain, no N/V/D, no fevers, no rash, no palpitations, no SOB/cough, no injury.      Past Medical History:  Diagnosis Date   Anxiety and depression    Arthritis of facet joints at multiple vertebral levels    L5-S1   Constipation    Dyslipidemia    Family history of breast cancer    Family history of uterine cancer    Insomnia    Irritable bowel syndrome    Migraine    Muscle tension headache    Plantar fasciitis of right foot     Patient Active Problem List   Diagnosis Date Noted   Family history of uterine cancer    Family history of breast cancer    Symptomatic anemia 07/18/2018   Upper GI bleeding 07/18/2018   Ovarian cancer, bilateral ([place]) 07/18/2018   HCAP (healthcare-associated pneumonia) 07/18/2018   Pleural effusion 07/07/2018   Migraine 07/06/2018   Pleural effusion on left 07/06/2018   Tobacco abuse 07/06/2018   Generalized anxiety disorder 05/02/2014   Unspecified constipation 08/10/2013   Rectal bleeding 08/10/2013   Lumbosacral  spondylosis without myelopathy 10/23/2011   Intractable migraine without aura 10/23/2011    Past Surgical History:  Procedure Laterality Date   CHOLECYSTECTOMY  2008   COLONOSCOPY N/A 08/13/2013   Procedure: COLONOSCOPY;  Surgeon: Rogene Houston, MD;  [facility];  Service: Endoscopy;  Laterality: N/A;  230-moved to 145 Ann to notify pt   ESOPHAGOGASTRODUODENOSCOPY     ESOPHAGOGASTRODUODENOSCOPY (EGD) WITH PROPOFOL N/A 07/20/2018   Procedure: ESOPHAGOGASTRODUODENOSCOPY (EGD) WITH PROPOFOL;  Surgeon: Rogene Houston, MD;  Location: [facility];  Service: Endoscopy;  Laterality: N/A;  Possible esophageal dilation.   IR PERC PLEURAL DRAIN W/INDWELL CATH W/IMG GUIDE  07/08/2018   IR THORACENTESIS ASP PLEURAL SPACE W/IMG GUIDE  07/07/2018   PLEURAL EFFUSION DRAINAGE Left 07/13/2018   Procedure: DRAINAGE OF LOCULATED PLEURAL EFFUSION;  Surgeon: Ivin Poot, MD;  Location: [place];  Service: Thoracic;  Laterality: Left;   TALC PLEURODESIS Left 07/13/2018   Procedure: Talc Pleuradesis;  Surgeon: Prescott Gum, Collier Salina, MD;  Location: [place];  Service: Thoracic;  Laterality: Left;   TUBAL LIGATION Bilateral    UTERINE ABLATION     VIDEO ASSISTED THORACOSCOPY Left 07/13/2018   Procedure: VIDEO ASSISTED THORACOSCOPY;  Surgeon: Ivin Poot, MD;  Location: [place];  Service: Thoracic;  Laterality: Left;     OB History   No obstetric history on  file.      Home Medications    Prior to Admission medications   Medication Sig Start Date End Date Taking? Authorizing Provider  benzonatate (TESSALON PERLES) 100 MG capsule Take 1 capsule (100 mg total) by mouth 3 (three) times daily as needed for cough. 07/16/18 07/16/19  Donne Hazel, MD  diazepam (VALIUM) 5 MG tablet TAKE ONE TABLET BY MOUTH AT BEDTIME AS NEEDED FOR ANXIETY 07/27/18   Kathrynn Ducking, MD  docusate sodium (COLACE) 100 MG capsule Take 1 capsule (100 mg total) by mouth 2 (two) times daily. 07/16/18 10/14/18  Donne Hazel, MD  oxyCODONE 10 MG TABS Take 1 tablet (10 mg total) by mouth every 4 (four) hours as needed for severe pain or breakthrough pain. 07/22/18   Derek Jack, MD  pantoprazole (PROTONIX) 40 MG tablet Take 1 tablet (40 mg total) by mouth 2 (two) times daily before a meal. 07/21/18 08/20/18  Manuella Ghazi, Pratik D, DO  promethazine (PHENERGAN) 25 MG tablet Take 1 tablet (25 mg total) by mouth every 6 (six) hours as needed for nausea, vomiting or refractory nausea / vomiting. 07/16/18   Donne Hazel, MD  senna-docusate (SENOKOT-S) 8.6-50 MG tablet Take 1 tablet by mouth at bedtime. 07/16/18 08/15/18  Donne Hazel, MD  SUMAtriptan (IMITREX) 100 MG tablet TAKE ONE TABLET BY MOUTH PRN UP to TWICE DAILY AS NEEDED. Patient taking differently: Take 100 mg by mouth 2 (two) times daily as needed for migraine.  02/11/18   Kathrynn Ducking, MD  traMADol (ULTRAM) 50 MG tablet Take 50 mg by mouth every 8 (eight) hours as needed for pain. 07/03/18   [provider]    Family History Family History  Problem Relation Age of Onset   Hypertension Mother    Obesity Mother    Diabetes Mother    Kidney disease Mother    Peripheral vascular disease Father    Atrial fibrillation Father    COPD Brother    Osteoporosis Brother    Crohn's disease Sister    Uterine cancer Sister 41       maternal half sister   Breast cancer Maternal Aunt 44   Colon cancer Neg Hx     Social History Social History   Tobacco Use   Smoking status: Former Smoker    Packs/day: 0.50    Years: 17.00    Pack years: 8.50    Types: Cigarettes    Quit date: 06/22/2018    Years since quitting: 0.1   Smokeless tobacco: Never Used  Substance Use Topics   Alcohol use: Yes    Alcohol/week: 1.0 standard drinks    Types: 1 Glasses of wine per week    Comment: Drinks alcohol on occasion   Drug use: No     Allergies   Morphine and related, Nortriptyline, Topamax [topiramate], Actifed cold-allergy  [chlorpheniramine-phenyleph er], Amoxicillin, Codeine, Erythromycin, Penicillins, and Sudafed [pseudoephedrine hcl]   Review of Systems Review of Systems ROS: Statement: All systems negative except as marked or noted in the HPI; Constitutional: Negative for fever and chills. ; ; Eyes: Negative for eye pain, redness and discharge. ; ; ENMT: Negative for ear pain, hoarseness, nasal congestion, sinus pressure and sore throat. ; ; Cardiovascular: +ongoing left lower chest pain. Negative for palpitations, diaphoresis, dyspnea and peripheral edema. ; ; Respiratory: Negative for cough, wheezing and stridor. ; ; Gastrointestinal: Negative for nausea, vomiting, diarrhea, abdominal pain, blood in stool, hematemesis, jaundice and rectal bleeding. . ; ;  Genitourinary: Negative for dysuria, flank pain and hematuria. ; ; Musculoskeletal: Negative for back pain and neck pain. Negative for swelling and trauma.; ; Skin: Negative for pruritus, rash, abrasions, blisters, bruising and skin lesion.; ; Neuro: Negative for headache, lightheadedness and neck stiffness. Negative for weakness, altered level of consciousness, altered mental status, extremity weakness, paresthesias, involuntary movement, seizure and syncope.       Physical Exam Updated Vital Signs BP (!) 110/46    Pulse 83    Temp (!) 97.4 F (36.3 C) (Oral)    Resp 19    Ht 5\' 6"  (1.676 m)    Wt 72.6 kg    SpO2 94%    BMI 25.82 kg/m    Patient Vitals for the past 24 hrs:  BP Temp Temp src Pulse Resp SpO2 Height Weight  07/31/18 1830 115/86 -- -- 91 15 94 % -- --  07/31/18 1800 (!) 104/44 -- -- 86 20 92 % -- --  07/31/18 1700 (!) 104/48 -- -- 85 (!) 24 92 % -- --  07/31/18 1630 112/67 -- -- 88 (!) 26 97 % -- --  07/31/18 1600 108/60 -- -- 84 18 93 % -- --  07/31/18 1530 (!) 110/46 -- -- 83 19 94 % -- --  07/31/18 1504 105/65 -- -- 82 17 94 % -- --  07/31/18 1429 -- -- -- -- -- -- 5\' 6"  (1.676 m) 72.6 kg  07/31/18 1428 127/68 (!) 97.4 F (36.3 C) Oral  88 15 96 % -- --     Physical Exam 1520: Physical examination:  Nursing notes reviewed; Vital signs and O2 SAT reviewed;  Constitutional: Well developed, Well nourished, Well hydrated, In no acute distress; Head:  Normocephalic, atraumatic; Eyes: EOMI, PERRL, No scleral icterus; ENMT: Mouth and pharynx normal, Mucous membranes moist; Neck: Supple, Full range of motion, No lymphadenopathy; Cardiovascular: Regular rate and rhythm, No gallop; Respiratory: Breath sounds clear & equal bilaterally, No wheezes.  Speaking full sentences with ease, Normal respiratory effort/excursion; Chest: +DSD left chest wall intact. No rash. Nontender, Movement normal; Abdomen: Soft, Nontender, Nondistended, Normal bowel sounds; Genitourinary: No CVA tenderness; Extremities: Peripheral pulses normal, No tenderness, No edema, No calf edema or asymmetry.; Neuro: AA&Ox3, Major CN grossly intact.  Speech clear. No gross focal motor or sensory deficits in extremities.; Skin: Color normal, Warm, Dry.    ED Treatments / Results  Labs (all labs ordered are listed, but only abnormal results are displayed)   EKG None  Radiology   Procedures Procedures (including critical care time)  Medications Ordered in ED Medications  0.9 %  sodium chloride infusion ( Intravenous Rate/Dose Verify 07/31/18 1920)  fentaNYL (SUBLIMAZE) injection 50 mcg (50 mcg Intravenous Given 07/31/18 1730)  0.9 %  sodium chloride infusion (has no administration in time range)  iohexol (OMNIPAQUE) 350 MG/ML injection 100 mL (100 mLs Intravenous Contrast Given 07/31/18 1711)     Initial Impression / Assessment and Plan / ED Course  I have reviewed the triage vital signs and the nursing notes.  Pertinent labs & imaging results that were available during my care of the patient were reviewed by me and considered in my medical decision making (see chart for details).    MDM Reviewed: nursing note, previous chart and vitals Reviewed previous: labs  and x-ray Interpretation: labs, x-ray and CT scan Total time providing critical care: 30-74 minutes. This excludes time spent performing separately reportable procedures and services. Consults: General Surgery, IR MD, Admitting MD.   CRITICAL  CARE Performed by: Francine Graven Total critical care time: 45 minutes Critical care time was exclusive of separately billable procedures and treating other patients. Critical care was necessary to treat or prevent imminent or life-threatening deterioration. Critical care was time spent personally by me on the following activities: development of treatment plan with patient and/or surrogate as well as nursing, discussions with consultants, evaluation of patient's response to treatment, examination of patient, obtaining history from patient or surrogate, ordering and performing treatments and interventions, ordering and review of laboratory studies, ordering and review of radiographic studies, pulse oximetry and re-evaluation of patient's condition.   Results for orders placed or performed during the hospital encounter of 07/31/18  Protime-INR  Result Value Ref Range   Prothrombin Time 13.7 11.4 - 15.2 seconds   INR 1.1 0.8 - 1.2  CBC with Differential/Platelet  Result Value Ref Range   WBC 7.2 4.0 - 10.5 K/uL   RBC 4.04 3.87 - 5.11 MIL/uL   Hemoglobin 12.0 12.0 - 15.0 g/dL   HCT 38.5 36.0 - 46.0 %   MCV 95.3 80.0 - 100.0 fL   MCH 29.7 26.0 - 34.0 pg   MCHC 31.2 30.0 - 36.0 g/dL   RDW 14.4 11.5 - 15.5 %   Platelets 699 (H) 150 - 400 K/uL   nRBC 0.0 0.0 - 0.2 %   Neutrophils Relative % 65 %   Neutro Abs 4.7 1.7 - 7.7 K/uL   Lymphocytes Relative 18 %   Lymphs Abs 1.3 0.7 - 4.0 K/uL   Monocytes Relative 10 %   Monocytes Absolute 0.7 0.1 - 1.0 K/uL   Eosinophils Relative 5 %   Eosinophils Absolute 0.4 0.0 - 0.5 K/uL   Basophils Relative 1 %   Basophils Absolute 0.1 0.0 - 0.1 K/uL   Immature Granulocytes 1 %   Abs Immature Granulocytes 0.05  0.00 - 0.07 K/uL  Comprehensive metabolic panel  Result Value Ref Range   Sodium 136 135 - 145 mmol/L   Potassium 3.8 3.5 - 5.1 mmol/L   Chloride 101 98 - 111 mmol/L   CO2 25 22 - 32 mmol/L   Glucose, Bld 95 70 - 99 mg/dL   BUN 9 6 - 20 mg/dL   Creatinine, Ser 0.63 0.44 - 1.00 mg/dL   Calcium 9.0 8.9 - 10.3 mg/dL   Total Protein 7.1 6.5 - 8.1 g/dL   Albumin 3.2 (L) 3.5 - 5.0 g/dL   AST 25 15 - 41 U/L   ALT 27 0 - 44 U/L   Alkaline Phosphatase 138 (H) 38 - 126 U/L   Total Bilirubin 0.9 0.3 - 1.2 mg/dL   GFR calc non Af Amer >60 >60 mL/min   GFR calc Af Amer >60 >60 mL/min   Anion gap 10 5 - 15  Type and screen  Result Value Ref Range   ABO/RH(D) O POS    Antibody Screen NEG    Sample Expiration      08/03/2018,2359 Performed at [facility], [address] [place], [place] [address]    Nm Pet Image Initial (pi) Skull Base To Thigh Result Date: 07/31/2018 CLINICAL DATA:  Initial treatment strategy for ovarian carcinoma. EXAM: NUCLEAR MEDICINE PET SKULL BASE TO THIGH TECHNIQUE: 8.16 mCi F-18 FDG was injected intravenously. Full-ring PET imaging was performed from the skull base to thigh after the radiotracer. CT data was obtained and used for attenuation correction and anatomic localization. Fasting blood glucose: 102 mg/dl COMPARISON:  CT 07/10/2018 FINDINGS: Mediastinal blood pool activity:  SUV max 2.94 Liver activity: SUV max 4 NECK: No hypermetabolic lymph nodes in the neck. Incidental CT findings: none CHEST: Rind of hypermetabolic activity along the medial mediastinum pleural surface of the LEFT lung consistent with talc pleurodesis. There is loculated lung at the LEFT lung base with a rim of hypermetabolic activity also related to pleurodesis. No hypermetabolic mediastinal lymph nodes. Mild metabolic activity associated the mid esophagus (image 71) Incidental CT findings: No suspicious pulmonary nodules. PleurX catheter in place. ABDOMEN/PELVIS: There is hypermetabolic tissue  associated with the enlarged ovaries. The hypermetabolic portion corresponds to the thickened soft tissue component along the walls of the cystic ovarian masses. The RIGHT ovarian mass measures 5.8 Cm with SUV max equal 6.7 along the soft tissue wall thickening portion. The cystic portion is non metabolic. The LEFT ovary is smaller measuring 4.3 cm with hypermetabolic tissue medially similar to the RIGHT. There is a rim of hypermetabolic activity along the inferior margin of the RIGHT hepatic lobe which corresponds to subtle peritoneal thickening. This metabolic activity is above background liver activity with SUV max equal 5.0 compared to 3.0 in the background liver. A similar hypermetabolic peritoneal thickening in the LEFT ventral peritoneal surface with SUV max equal 3.4 on image 112. Mild hypermetabolic nodularity in the greater omentum with SUV max equal 3.0 on image 113. Incidental CT findings: There is a large splenic hematoma superior to the spleen confined superiorly by the LEFT hemidiaphragm. This large round hematoma measures 10.5 x 11.3 by 9.0 cm. Within the hematoma is bounded inferiorly by the spleen superiorly by the diaphragm as seen on coronal image 67/126 and PET-CT image 131. SKELETON: No focal hypermetabolic activity to suggest skeletal metastasis. Incidental CT findings: none IMPRESSION: 1. Hypermetabolic tissue associated with cystic masses of the LEFT RIGHT ovary consistent with ovarian carcinoma. 2. Evidence of carcinoma metastasis with rind of hypermetabolic tissue along the inferior margin of liver RIGHT hepatic lobe as well as on along the ventral LEFT peritoneal space and greater omentum. 3. Hypermetabolic tissue in the LEFT pleural space is related to talc pleurodesis. Cannot exclude underlying malignancy. 4. Incidental finding of a large hematoma in LEFT upper quadrant bound superiorly by the LEFT hemidiaphragm and inferiorly by the spleen. Recommend CTA of the abdomen to evaluate for  persistent hemorrhage from the spleen and surgical consultation. Findings conveyed toSREEDHAR KATRAGADDA on 07/31/2018  at13:20. Electronically Signed   By: Suzy Bouchard M.D.   On: 07/31/2018 13:31    Ct Angio Chest Pe W And/or Wo Contrast Result Date: 07/31/2018 CLINICAL DATA:  Operative complication of a Pleurx catheter placement. EXAM: CT ANGIOGRAPHY CHEST, ABDOMEN AND PELVIS TECHNIQUE: Multidetector CT imaging through the chest, abdomen and pelvis was performed using the standard protocol during bolus administration of intravenous contrast. Multiplanar reconstructed images and MIPs were obtained and reviewed to evaluate the vascular anatomy. CONTRAST:  138mL OMNIPAQUE IOHEXOL 350 MG/ML SOLN COMPARISON:  Same day PET-CT FINDINGS: CTA CHEST FINDINGS Cardiovascular: Preferential opacification of the thoracic aorta. Evaluation of the aortic root limited due to cardiac pulsation artifact. No convincing evidence of thoracic aortic aneurysm or dissection. Normal heart size. No pericardial effusion. Mediastinum/Nodes: Several reactive but not pathologically enlarged mediastinal and left hilar nodes are present, likely reactive to the adjacent inflammatory features given absence of uptake on PET-CT. Similarly a subcentimeter hypoattenuating nodule in the left lobe thyroid without uptake on comparison PET images is likely benign. There is nonspecific thickening of the mid esophagus and a moderate hiatal hernia. The airways  are diffusely thickened with scattered secretions. There is extension of a large perisplenic hematoma through the diaphragmatic hiatus and into the lower posterior mediastinum. Lungs/Pleura: Hypoventilatory changes are present along with dependent atelectasis posteriorly. Nodular, rind-like pleural thickening throughout the left hemithorax and within the fissures is similar to PET-CT. No suspicious nodules. A small, loculated left pleural effusion is again seen. Left Pleurx catheter in place  terminating along the left mediastinal border directed apically. Musculoskeletal: No chest wall mass or suspicious bone lesions identified. Review of the MIP images confirms the above findings. CTA ABDOMEN AND PELVIS FINDINGS VASCULAR Aorta: The aorta is normal caliber. No intramural hematoma, dissection flap or other luminal abnormality of the aorta is seen. No periaortic stranding or hemorrhage. Minimal atheromatous plaque near the aortic bifurcation. No aneurysm or ectasia. Celiac: Patent without evidence of aneurysm, dissection, vasculitis or significant stenosis. SMA: Patent without evidence of aneurysm, dissection, vasculitis or significant stenosis. Renals: Both renal arteries are patent without evidence of aneurysm, dissection, vasculitis, fibromuscular dysplasia or significant stenosis. IMA: Patent without evidence of aneurysm, dissection, vasculitis or significant stenosis. Inflow: Patent without evidence of aneurysm, dissection, vasculitis or significant stenosis. Veins: No obvious venous abnormality within the limitations of this arterial phase study. Review of the MIP images confirms the above findings. NON-VASCULAR Hepatobiliary: No focal liver abnormality or hepatic injury is seen. Small amount of perihepatic hemorrhage likely tracking from the site of splenic injury. Patient is post cholecystectomy. Slight prominence of the biliary tree likely related to reservoir effect. No calcified intraductal gallstones. Pancreas: Unremarkable. No pancreatic ductal dilatation or surrounding inflammatory changes. Spleen: There is a 3.9 cm splenic laceration with a faint central hyperattenuating focus which remains stable on precontrast images from PET-CT as well as post-contrast venous phase and delayed images. There is surrounding mixed attenuation hemorrhage. This capsular hemorrhage partially protrudes into the diaphragmatic hiatus (axial series 12, image 7). Extracapsular hemorrhage is present as well,  partially decompressing into the lesser sac along the greater curvature of the stomach. Additional hemoperitoneum is present in the right perihepatic space, both pericolic gutters and layering within the pelvis. Adrenals/Urinary Tract: No adrenal hemorrhage or renal injury identified. Bladder is unremarkable. Stomach/Bowel: Moderate hiatal hernia. Fluid collection along the greater curvature of the Lymphatic: No enlarged abdominopelvic lymph nodes. Reproductive: Bilateral thick-walled cystic lesions within both enlarged ovaries compatible with patient's known ovarian neoplasms. Anteverted uterus. Other: Hemoperitoneum, as detailed splenic section above. No free gas. Extensive omental caking and thick walled enhancement of the anterior peritoneal surfaces. Musculoskeletal: Diffuse body wall edema. No acute osseous abnormality or suspicious osseous lesion. Postsurgical changes from PleurX catheter placement with stranding in the left anterior flank. Catheter enters at the anterior left sixth interspace. Review of the MIP images confirms the above findings. IMPRESSION: 3.9 cm splenic laceration with central hyperattenuation concerning for contained vascular injury with capsular hematoma extending over greater than 80% of the splenic surface and with intraperitoneal extension of hemorrhage. Findings are compatible with a AAST grade V splenic injury. Urgent surgical consultation is warranted. PleurX catheter in the left 6th interspace, anterior axillary line and terminates along the mediastinal border directed apically. Redemonstration of the complex thick-walled cystic lesions in both adnexa, compatible with known ovarian neoplasm. Extensive omental caking is present as well as thickening along the anterior liver. Rind like thickening of the pleural surfaces within the left hemithorax and associated volume loss with trace effusion. Could reflect postprocedural changes from prior pleurodesis though and malignancy is not  excluded. These results were  called by telephone at the time of interpretation on 07/31/2018 at 6:15 pm to Dr. Francine Graven, who verbally acknowledged these results. Electronically Signed   By: Lovena Le M.D.   On: 07/31/2018 18:15    Dg Chest Port 1 View Result Date: 07/31/2018 CLINICAL DATA:  PleurX, effusion, suspected intra-abdominal hemorrhage on PET-CT EXAM: PORTABLE CHEST 1 VIEW COMPARISON:  Same day PET-CT, chest radiograph, 07/18/2018 FINDINGS: No significant change in chest radiograph with a moderate left pleural effusion and associated atelectasis or consolidation, with a tunneled left-sided chest catheter in position about the posterior pleural space. The right lung is normally aerated. Cardiomegaly. IMPRESSION: No significant change in chest radiograph with a moderate left pleural effusion and associated atelectasis or consolidation, with a tunneled left-sided chest catheter in position about the posterior pleural space. The right lung is normally aerated. Cardiomegaly. Electronically Signed   By: Eddie Candle M.D.   On: 07/31/2018 15:37    Ct Angio Abd/pel W And/or Wo Contrast Result Date: 07/31/2018 CLINICAL DATA:  Operative complication of a Pleurx catheter placement. EXAM: CT ANGIOGRAPHY CHEST, ABDOMEN AND PELVIS TECHNIQUE: Multidetector CT imaging through the chest, abdomen and pelvis was performed using the standard protocol during bolus administration of intravenous contrast. Multiplanar reconstructed images and MIPs were obtained and reviewed to evaluate the vascular anatomy. CONTRAST:  133mL OMNIPAQUE IOHEXOL 350 MG/ML SOLN COMPARISON:  Same day PET-CT FINDINGS: CTA CHEST FINDINGS Cardiovascular: Preferential opacification of the thoracic aorta. Evaluation of the aortic root limited due to cardiac pulsation artifact. No convincing evidence of thoracic aortic aneurysm or dissection. Normal heart size. No pericardial effusion. Mediastinum/Nodes: Several reactive but not  pathologically enlarged mediastinal and left hilar nodes are present, likely reactive to the adjacent inflammatory features given absence of uptake on PET-CT. Similarly a subcentimeter hypoattenuating nodule in the left lobe thyroid without uptake on comparison PET images is likely benign. There is nonspecific thickening of the mid esophagus and a moderate hiatal hernia. The airways are diffusely thickened with scattered secretions. There is extension of a large perisplenic hematoma through the diaphragmatic hiatus and into the lower posterior mediastinum. Lungs/Pleura: Hypoventilatory changes are present along with dependent atelectasis posteriorly. Nodular, rind-like pleural thickening throughout the left hemithorax and within the fissures is similar to PET-CT. No suspicious nodules. A small, loculated left pleural effusion is again seen. Left Pleurx catheter in place terminating along the left mediastinal border directed apically. Musculoskeletal: No chest wall mass or suspicious bone lesions identified. Review of the MIP images confirms the above findings. CTA ABDOMEN AND PELVIS FINDINGS VASCULAR Aorta: The aorta is normal caliber. No intramural hematoma, dissection flap or other luminal abnormality of the aorta is seen. No periaortic stranding or hemorrhage. Minimal atheromatous plaque near the aortic bifurcation. No aneurysm or ectasia. Celiac: Patent without evidence of aneurysm, dissection, vasculitis or significant stenosis. SMA: Patent without evidence of aneurysm, dissection, vasculitis or significant stenosis. Renals: Both renal arteries are patent without evidence of aneurysm, dissection, vasculitis, fibromuscular dysplasia or significant stenosis. IMA: Patent without evidence of aneurysm, dissection, vasculitis or significant stenosis. Inflow: Patent without evidence of aneurysm, dissection, vasculitis or significant stenosis. Veins: No obvious venous abnormality within the limitations of this arterial  phase study. Review of the MIP images confirms the above findings. NON-VASCULAR Hepatobiliary: No focal liver abnormality or hepatic injury is seen. Small amount of perihepatic hemorrhage likely tracking from the site of splenic injury. Patient is post cholecystectomy. Slight prominence of the biliary tree likely related to reservoir effect. No calcified intraductal  gallstones. Pancreas: Unremarkable. No pancreatic ductal dilatation or surrounding inflammatory changes. Spleen: There is a 3.9 cm splenic laceration with a faint central hyperattenuating focus which remains stable on precontrast images from PET-CT as well as post-contrast venous phase and delayed images. There is surrounding mixed attenuation hemorrhage. This capsular hemorrhage partially protrudes into the diaphragmatic hiatus (axial series 12, image 7). Extracapsular hemorrhage is present as well, partially decompressing into the lesser sac along the greater curvature of the stomach. Additional hemoperitoneum is present in the right perihepatic space, both pericolic gutters and layering within the pelvis. Adrenals/Urinary Tract: No adrenal hemorrhage or renal injury identified. Bladder is unremarkable. Stomach/Bowel: Moderate hiatal hernia. Fluid collection along the greater curvature of the Lymphatic: No enlarged abdominopelvic lymph nodes. Reproductive: Bilateral thick-walled cystic lesions within both enlarged ovaries compatible with patient's known ovarian neoplasms. Anteverted uterus. Other: Hemoperitoneum, as detailed splenic section above. No free gas. Extensive omental caking and thick walled enhancement of the anterior peritoneal surfaces. Musculoskeletal: Diffuse body wall edema. No acute osseous abnormality or suspicious osseous lesion. Postsurgical changes from PleurX catheter placement with stranding in the left anterior flank. Catheter enters at the anterior left sixth interspace. Review of the MIP images confirms the above findings.  IMPRESSION: 3.9 cm splenic laceration with central hyperattenuation concerning for contained vascular injury with capsular hematoma extending over greater than 80% of the splenic surface and with intraperitoneal extension of hemorrhage. Findings are compatible with a AAST grade V splenic injury. Urgent surgical consultation is warranted. PleurX catheter in the left 6th interspace, anterior axillary line and terminates along the mediastinal border directed apically. Redemonstration of the complex thick-walled cystic lesions in both adnexa, compatible with known ovarian neoplasm. Extensive omental caking is present as well as thickening along the anterior liver. Rind like thickening of the pleural surfaces within the left hemithorax and associated volume loss with trace effusion. Could reflect postprocedural changes from prior pleurodesis though and malignancy is not excluded. These results were called by telephone at the time of interpretation on 07/31/2018 at 6:15 pm to Dr. Francine Graven, who verbally acknowledged these results. Electronically Signed   By: Lovena Le M.D.   On: 07/31/2018 18:15    SHELLY SPENSER was evaluated in Emergency Department on 07/31/2018 for the symptoms described in the history of present illness. She was evaluated in the context of the global COVID-19 pandemic, which necessitated consideration that the patient might be at risk for infection with the SARS-CoV-2 virus that causes COVID-19. Institutional protocols and algorithms that pertain to the evaluation of patients at risk for COVID-19 are in a state of rapid change based on information released by regulatory bodies including the CDC and federal and state organizations. These policies and algorithms were followed during the patient's care in the ED.    1815:  H/H stable. BP per baseline. Pt remains comfortable with IV pain meds. CT as above. T/C returned from [facility] Dr. Dema Severin, case discussed, including:  HPI,  pertinent PM/SHx, VS/PE, dx testing, ED course and treatment:  States no acute surgical issue at this time, agreeable to consult, pt will also need IR consult, admit to [facility] ICU.   1830:  T/C returned from [facility] IR Dr. Vernard Gambles, case discussed, including:  HPI, pertinent PM/SHx, VS/PE, dx testing, ED course and treatment:  States he will view with CTA images, no acute surgical issue at this time, agreeable to consult, requests to admit to [facility] ICU.  1855:  T/C returned from [place] Dr. Lynetta Mare,  case discussed, including:  HPI, pertinent PM/SHx, VS/PE, dx testing, ED course and treatment:  States pt does not need admit to ICU at this time, but will need progressive care unit monitoring, requests to admit to [facility] service.   1900:  T/C returned from [facility] Dr. Maudie Mercury, case discussed, including:  HPI, pertinent PM/SHx, VS/PE, dx testing, ED course and treatment, as well as d/w [facility], IR MD, and PCCM MD:  Agreeable to facilitate transfer/admit to [facility].     Final Clinical Impressions(s) / ED Diagnoses   Final diagnoses:  None    ED Discharge Orders    None       Francine Graven, DO 08/02/18 2018

## 2018-07-31 NOTE — ED Notes (Signed)
Patient ambulated to restroom with minimal assistance. ?

## 2018-07-31 NOTE — Progress Notes (Addendum)
Pt arrived via [facility] from [place] to room 4East-27. Pt ambulatory and able to stand for weight. CHG performed and telemetry applied. Pt has PleurX catheter taped to left chest below breast. Pt has low BP (82/69 with MAP (75)). She says this is normal for her. Will continue to monitor her pressures. Pt oriented to room. Bed in lowest position and call bell within reach. Will continue to monitor. Lajoyce Corners, RN

## 2018-07-31 NOTE — H&P (Addendum)
TRH H&P    Patient Demographics:    Michelle Holt, is a 48 y.o. female  MRN: [id number]  DOB - 1971-01-03  Admit Date - 07/31/2018  Referring MD/NP/PA: Francine Graven  Outpatient Primary MD for the patient is [name], MD Tharon Aquas Trigt - CT surgery Patient coming from: home  Chief complaint- splenic laceration   HPI:    Michelle Holt  is a 48 y.o. female, 48 y.o. female with a history of anxiety depression, and insomnia. Pt admitted on 07/06/2018 for dyspnea and found to have left sided pleural effusion with compressive atelectasis and mediastinal shift and underwent IR thoracentesis and pigtail cathter placement 07/08/2018 and then subsequently VATS on 07/13/2018 w PleurX catheter.  Pt admitted on 07/18/2018 for symptomatic anemia with Hgb 4. EGD 07/20/2018-> multiple distal esophageal rings and a non-bleeding cratering gastric ulcer near the prepyloric area. Pt received transfusion and Hgb improved to 10.4 upon discharge.  Pt apparently had PET scan for evaluation of bilateral ovarian mass (CA 125 [id number]) and sent to ER for splenic laceration.  Hgb 12.0  .  Pt notes has had LUQ pain but this has not increased.  Pt notes nausea. Slight constipation.  Denies fever, chills, cp, palp, sob, emesis, diarrhea, brbpr, black stool, dysuria, hematuria.   In ED.  T 97.4, P 88 R 15, Bp 127/68  Pox 96% on RA Wt 72.6 kg  Wbc 7.2, Hgb 12.0, Plt 699 Na 136, K 3.8, Bun 9, Creatinine 0.63 Ast 25, Alt 27 Alk phos 138 (high), T. Bili 0.9 Alb 3.2  INR 1.1   ED spoke with Dr. Dema Severin from surgery who recommended IR consultation and admission.  ED spoke with critical care who recommended medicine admission to a progressive bed for now.   ED also spoke with IR Arne Cleveland) -> no urgent intervention tonight, unless decompensating.   Pt will be admitted for splenic laceration w stable Hgb.    Review of systems:    In  addition to the HPI above,  No Fever-chills, No Headache, No changes with Vision or hearing, No problems swallowing food or Liquids, No Chest pain, Cough or Shortness of Breath, No Blood in stool or Urine, No dysuria, No new skin rashes or bruises, No new joints pains-aches,  No new weakness, tingling, numbness in any extremity, No recent weight gain or loss, No polyuria, polydypsia or polyphagia, No significant Mental Stressors.  All other systems reviewed and are negative.    Past History of the following :    Past Medical History:  Diagnosis Date   Anxiety and depression    Arthritis of facet joints at multiple vertebral levels    L5-S1   Constipation    Dyslipidemia    Family history of breast cancer    Family history of uterine cancer    Insomnia    Irritable bowel syndrome    Migraine    Muscle tension headache    Ovarian carcinoma (HCC)    Plantar fasciitis of right foot  Past Surgical History:  Procedure Laterality Date   CHOLECYSTECTOMY  2008   COLONOSCOPY N/A 08/13/2013   Procedure: COLONOSCOPY;  Surgeon: Rogene Houston, MD;  [facility];  Service: Endoscopy;  Laterality: N/A;  230-moved to 145 Ann to notify pt   ESOPHAGOGASTRODUODENOSCOPY     ESOPHAGOGASTRODUODENOSCOPY (EGD) WITH PROPOFOL N/A 07/20/2018   Procedure: ESOPHAGOGASTRODUODENOSCOPY (EGD) WITH PROPOFOL;  Surgeon: Rogene Houston, MD;  [facility];  Service: Endoscopy;  Laterality: N/A;  Possible esophageal dilation.   IR PERC PLEURAL DRAIN W/INDWELL CATH W/IMG GUIDE  07/08/2018   IR THORACENTESIS ASP PLEURAL SPACE W/IMG GUIDE  07/07/2018   PLEURAL EFFUSION DRAINAGE Left 07/13/2018   Procedure: DRAINAGE OF LOCULATED PLEURAL EFFUSION;  Surgeon: Ivin Poot, MD;  Location: [place];  Service: Thoracic;  Laterality: Left;   TALC PLEURODESIS Left 07/13/2018   Procedure: Talc Pleuradesis;  Surgeon: Prescott Gum, Collier Salina, MD;  Location: [facility] OR;  Service: Thoracic;   Laterality: Left;   TUBAL LIGATION Bilateral    UTERINE ABLATION     VIDEO ASSISTED THORACOSCOPY Left 07/13/2018   Procedure: VIDEO ASSISTED THORACOSCOPY;  Surgeon: Ivin Poot, MD;  Location: [facility] OR;  Service: Thoracic;  Laterality: Left;      Social History:      Social History   Tobacco Use   Smoking status: Former Smoker    Packs/day: 0.50    Years: 17.00    Pack years: 8.50    Types: Cigarettes    Quit date: 06/22/2018    Years since quitting: 0.1   Smokeless tobacco: Never Used  Substance Use Topics   Alcohol use: Yes    Alcohol/week: 1.0 standard drinks    Types: 1 Glasses of wine per week    Comment: Drinks alcohol on occasion       Family History :     Family History  Problem Relation Age of Onset   Hypertension Mother    Obesity Mother    Diabetes Mother    Kidney disease Mother    Peripheral vascular disease Father    Atrial fibrillation Father    COPD Brother    Osteoporosis Brother    Crohn's disease Sister    Uterine cancer Sister 72       maternal half sister   Breast cancer Maternal Aunt 1   Colon cancer Neg Hx       Home Medications:   Prior to Admission medications   Medication Sig Start Date End Date Taking? Authorizing Provider  diazepam (VALIUM) 5 MG tablet TAKE ONE TABLET BY MOUTH AT BEDTIME AS NEEDED FOR ANXIETY 07/27/18  Yes Kathrynn Ducking, MD  docusate sodium (COLACE) 100 MG capsule Take 1 capsule (100 mg total) by mouth 2 (two) times daily. 07/16/18 10/14/18 Yes Donne Hazel, MD  oxyCODONE 10 MG TABS Take 1 tablet (10 mg total) by mouth every 4 (four) hours as needed for severe pain or breakthrough pain. 07/22/18  Yes Derek Jack, MD  pantoprazole (PROTONIX) 40 MG tablet Take 1 tablet (40 mg total) by mouth 2 (two) times daily before a meal. 07/21/18 08/20/18 Yes Shah, Pratik D, DO  promethazine (PHENERGAN) 25 MG tablet Take 1 tablet (25 mg total) by mouth every 6 (six) hours as needed for nausea, vomiting  or refractory nausea / vomiting. 07/16/18  Yes Donne Hazel, MD  senna-docusate (SENOKOT-S) 8.6-50 MG tablet Take 1 tablet by mouth at bedtime. 07/16/18 08/15/18 Yes Donne Hazel, MD  SUMAtriptan Dellis Filbert)  100 MG tablet TAKE ONE TABLET BY MOUTH PRN UP to TWICE DAILY AS NEEDED. Patient taking differently: Take 100 mg by mouth 2 (two) times daily as needed for migraine.  02/11/18  Yes Kathrynn Ducking, MD  benzonatate (TESSALON PERLES) 100 MG capsule Take 1 capsule (100 mg total) by mouth 3 (three) times daily as needed for cough. Patient not taking: Reported on 07/31/2018 07/16/18 07/16/19  Donne Hazel, MD     Allergies:     Allergies  Allergen Reactions   Morphine And Related Itching   Nortriptyline Other (See Comments)    Significant weight gain   Topamax [Topiramate] Diarrhea    nausea   Actifed Cold-Allergy [Chlorpheniramine-Phenyleph Er] Rash   Amoxicillin Rash    Did it involve swelling of the face/tongue/throat, SOB, or low BP? Unknown Did it involve sudden or severe rash/hives, skin peeling, or any reaction on the inside of your mouth or nose? Unknown Did you need to seek medical attention at a hospital or doctor's office? Unknown When did it last happen? teenager If all above answers are "NO", may proceed with cephalosporin use.    Codeine Hives   Erythromycin Rash   Penicillins Rash    Did it involve swelling of the face/tongue/throat, SOB, or low BP? Unknown Did it involve sudden or severe rash/hives, skin peeling, or any reaction on the inside of your mouth or nose? Unknown Did you need to seek medical attention at a hospital or doctor's office? Unknown When did it last happen? teenager If all above answers are "NO", may proceed with cephalosporin use.    Sudafed [Pseudoephedrine Hcl] Rash     Physical Exam:   Vitals  Blood pressure (!) 118/58, pulse 86, temperature (!) 97.4 F (36.3 C), temperature source Oral, resp. rate (!) 22, height '5\' 6"'   (1.676 m), weight 72.6 kg, SpO2 91 %.  1.  General: axox3  2. Psychiatric: euthymic  3. Neurologic: cn2-12 intact, reflexes 2+ symmetric, diffuse with no clonus, motor 5/5 in alll 4ext  4. HEENMT:  Anicteric, pupils 1.19m symmetric, direct, consensual , near intact Neck: no jvd, no bruit,   5. Respiratory : CTAB  6. Cardiovascular : rrr s1, s2, no m/g/r  7. Gastrointestinal:  Abd: soft, nt, nd, +bs,  Bandage over the VATS area LUQ  8. Skin:  Ext: no c/c/e,  No rash   9.Musculoskeletal:  Good ROM,   No adenopathy     Data Review:    CBC Recent Labs  Lab 07/31/18 1500  WBC 7.2  HGB 12.0  HCT 38.5  PLT 699*  MCV 95.3  MCH 29.7  MCHC 31.2  RDW 14.4  LYMPHSABS 1.3  MONOABS 0.7  EOSABS 0.4  BASOSABS 0.1   ------------------------------------------------------------------------------------------------------------------  Results for orders placed or performed during the hospital encounter of 07/31/18 (from the past 48 hour(s))  Type and screen     Status: None   Collection Time: 07/31/18  3:00 PM  Result Value Ref Range   ABO/RH(D) O POS    Antibody Screen NEG    Sample Expiration      08/03/2018,2359 Performed at [facility] [address] [place] [id number]  Protime-INR     Status: None   Collection Time: 07/31/18  3:00 PM  Result Value Ref Range   Prothrombin Time 13.7 11.4 - 15.2 seconds   INR 1.1 0.8 - 1.2    Comment: (NOTE) INR goal varies based on device and disease states. Performed at [facility], [address], [place], [place] [address]   CBC with Differential/Platelet     Status: Abnormal   Collection Time: 07/31/18  3:00 PM  Result Value Ref Range   WBC 7.2 4.0 - 10.5 K/uL   RBC 4.04 3.87 - 5.11 MIL/uL   Hemoglobin 12.0 12.0 - 15.0 g/dL   HCT 38.5 36.0 - 46.0 %   MCV 95.3 80.0 - 100.0 fL   MCH 29.7 26.0 - 34.0 pg   MCHC 31.2 30.0 - 36.0 g/dL   RDW 14.4 11.5 - 15.5 %   Platelets 699 (H) 150 - 400 K/uL   nRBC 0.0 0.0 -  0.2 %   Neutrophils Relative % 65 %   Neutro Abs 4.7 1.7 - 7.7 K/uL   Lymphocytes Relative 18 %   Lymphs Abs 1.3 0.7 - 4.0 K/uL   Monocytes Relative 10 %   Monocytes Absolute 0.7 0.1 - 1.0 K/uL   Eosinophils Relative 5 %   Eosinophils Absolute 0.4 0.0 - 0.5 K/uL   Basophils Relative 1 %   Basophils Absolute 0.1 0.0 - 0.1 K/uL   Immature Granulocytes 1 %   Abs Immature Granulocytes 0.05 0.00 - 0.07 K/uL    Comment: Performed at [facility], [address] [place], [place] [address]  Comprehensive metabolic panel     Status: Abnormal   Collection Time: 07/31/18  3:00 PM  Result Value Ref Range   Sodium 136 135 - 145 mmol/L   Potassium 3.8 3.5 - 5.1 mmol/L   Chloride 101 98 - 111 mmol/L   CO2 25 22 - 32 mmol/L   Glucose, Bld 95 70 - 99 mg/dL   BUN 9 6 - 20 mg/dL   Creatinine, Ser 0.63 0.44 - 1.00 mg/dL   Calcium 9.0 8.9 - 10.3 mg/dL   Total Protein 7.1 6.5 - 8.1 g/dL   Albumin 3.2 (L) 3.5 - 5.0 g/dL   AST 25 15 - 41 U/L   ALT 27 0 - 44 U/L   Alkaline Phosphatase 138 (H) 38 - 126 U/L   Total Bilirubin 0.9 0.3 - 1.2 mg/dL   GFR calc non Af Amer >60 >60 mL/min   GFR calc Af Amer >60 >60 mL/min   Anion gap 10 5 - 15    Comment: Performed at [facility], [address], [place], [place] [address]    Chemistries  Recent Labs  Lab 07/31/18 1500  NA 136  K 3.8  CL 101  CO2 25  GLUCOSE 95  BUN 9  CREATININE 0.63  CALCIUM 9.0  AST 25  ALT 27  ALKPHOS 138*  BILITOT 0.9   ------------------------------------------------------------------------------------------------------------------  ------------------------------------------------------------------------------------------------------------------ GFR: Estimated Creatinine Clearance: 87.7 mL/min (by C-G formula based on SCr of 0.63 mg/dL). Liver Function Tests: Recent Labs  Lab 07/31/18 1500  AST 25  ALT 27  ALKPHOS 138*  BILITOT 0.9  PROT 7.1  ALBUMIN 3.2*   No results for input(s): LIPASE, AMYLASE in the  last 168 hours. No results for input(s): AMMONIA in the last 168 hours. Coagulation Profile: Recent Labs  Lab 07/31/18 1500  INR 1.1   Cardiac Enzymes: No results for input(s): CKTOTAL, CKMB, CKMBINDEX, TROPONINI in the last 168 hours. BNP (last 3 results) No results for input(s): PROBNP in the last 8760 hours. HbA1C: No results for input(s): HGBA1C in the last 72 hours. CBG: Recent Labs  Lab 07/31/18 0849  GLUCAP 102*   Lipid Profile: No results for input(s): CHOL, HDL, LDLCALC, TRIG, CHOLHDL, LDLDIRECT  in the last 72 hours. Thyroid Function Tests: No results for input(s): TSH, T4TOTAL, FREET4, T3FREE, THYROIDAB in the last 72 hours. Anemia Panel: No results for input(s): VITAMINB12, FOLATE, FERRITIN, TIBC, IRON, RETICCTPCT in the last 72 hours.  --------------------------------------------------------------------------------------------------------------- Urine analysis:    Component Value Date/Time   COLORURINE YELLOW 07/18/2018 1803   APPEARANCEUR CLEAR 07/18/2018 1803   LABSPEC 1.011 07/18/2018 1803   PHURINE 5.0 07/18/2018 1803   GLUCOSEU NEGATIVE 07/18/2018 1803   HGBUR NEGATIVE 07/18/2018 1803   BILIRUBINUR NEGATIVE 07/18/2018 1803   KETONESUR NEGATIVE 07/18/2018 1803   PROTEINUR NEGATIVE 07/18/2018 1803   NITRITE NEGATIVE 07/18/2018 1803   LEUKOCYTESUR NEGATIVE 07/18/2018 1803      Imaging Results:    Ct Angio Chest Pe W And/or Wo Contrast  Result Date: 07/31/2018 CLINICAL DATA:  Operative complication of a Pleurx catheter placement. EXAM: CT ANGIOGRAPHY CHEST, ABDOMEN AND PELVIS TECHNIQUE: Multidetector CT imaging through the chest, abdomen and pelvis was performed using the standard protocol during bolus administration of intravenous contrast. Multiplanar reconstructed images and MIPs were obtained and reviewed to evaluate the vascular anatomy. CONTRAST:  143m OMNIPAQUE IOHEXOL 350 MG/ML SOLN COMPARISON:  Same day PET-CT FINDINGS: CTA CHEST FINDINGS  Cardiovascular: Preferential opacification of the thoracic aorta. Evaluation of the aortic root limited due to cardiac pulsation artifact. No convincing evidence of thoracic aortic aneurysm or dissection. Normal heart size. No pericardial effusion. Mediastinum/Nodes: Several reactive but not pathologically enlarged mediastinal and left hilar nodes are present, likely reactive to the adjacent inflammatory features given absence of uptake on PET-CT. Similarly a subcentimeter hypoattenuating nodule in the left lobe thyroid without uptake on comparison PET images is likely benign. There is nonspecific thickening of the mid esophagus and a moderate hiatal hernia. The airways are diffusely thickened with scattered secretions. There is extension of a large perisplenic hematoma through the diaphragmatic hiatus and into the lower posterior mediastinum. Lungs/Pleura: Hypoventilatory changes are present along with dependent atelectasis posteriorly. Nodular, rind-like pleural thickening throughout the left hemithorax and within the fissures is similar to PET-CT. No suspicious nodules. A small, loculated left pleural effusion is again seen. Left Pleurx catheter in place terminating along the left mediastinal border directed apically. Musculoskeletal: No chest wall mass or suspicious bone lesions identified. Review of the MIP images confirms the above findings. CTA ABDOMEN AND PELVIS FINDINGS VASCULAR Aorta: The aorta is normal caliber. No intramural hematoma, dissection flap or other luminal abnormality of the aorta is seen. No periaortic stranding or hemorrhage. Minimal atheromatous plaque near the aortic bifurcation. No aneurysm or ectasia. Celiac: Patent without evidence of aneurysm, dissection, vasculitis or significant stenosis. SMA: Patent without evidence of aneurysm, dissection, vasculitis or significant stenosis. Renals: Both renal arteries are patent without evidence of aneurysm, dissection, vasculitis, fibromuscular  dysplasia or significant stenosis. IMA: Patent without evidence of aneurysm, dissection, vasculitis or significant stenosis. Inflow: Patent without evidence of aneurysm, dissection, vasculitis or significant stenosis. Veins: No obvious venous abnormality within the limitations of this arterial phase study. Review of the MIP images confirms the above findings. NON-VASCULAR Hepatobiliary: No focal liver abnormality or hepatic injury is seen. Small amount of perihepatic hemorrhage likely tracking from the site of splenic injury. Patient is post cholecystectomy. Slight prominence of the biliary tree likely related to reservoir effect. No calcified intraductal gallstones. Pancreas: Unremarkable. No pancreatic ductal dilatation or surrounding inflammatory changes. Spleen: There is a 3.9 cm splenic laceration with a faint central hyperattenuating focus which remains stable on precontrast images from PET-CT as well as  post-contrast venous phase and delayed images. There is surrounding mixed attenuation hemorrhage. This capsular hemorrhage partially protrudes into the diaphragmatic hiatus (axial series 12, image 7). Extracapsular hemorrhage is present as well, partially decompressing into the lesser sac along the greater curvature of the stomach. Additional hemoperitoneum is present in the right perihepatic space, both pericolic gutters and layering within the pelvis. Adrenals/Urinary Tract: No adrenal hemorrhage or renal injury identified. Bladder is unremarkable. Stomach/Bowel: Moderate hiatal hernia. Fluid collection along the greater curvature of the Lymphatic: No enlarged abdominopelvic lymph nodes. Reproductive: Bilateral thick-walled cystic lesions within both enlarged ovaries compatible with patient's known ovarian neoplasms. Anteverted uterus. Other: Hemoperitoneum, as detailed splenic section above. No free gas. Extensive omental caking and thick walled enhancement of the anterior peritoneal surfaces.  Musculoskeletal: Diffuse body wall edema. No acute osseous abnormality or suspicious osseous lesion. Postsurgical changes from PleurX catheter placement with stranding in the left anterior flank. Catheter enters at the anterior left sixth interspace. Review of the MIP images confirms the above findings. IMPRESSION: 3.9 cm splenic laceration with central hyperattenuation concerning for contained vascular injury with capsular hematoma extending over greater than 80% of the splenic surface and with intraperitoneal extension of hemorrhage. Findings are compatible with a AAST grade V splenic injury. Urgent surgical consultation is warranted. PleurX catheter in the left 6th interspace, anterior axillary line and terminates along the mediastinal border directed apically. Redemonstration of the complex thick-walled cystic lesions in both adnexa, compatible with known ovarian neoplasm. Extensive omental caking is present as well as thickening along the anterior liver. Rind like thickening of the pleural surfaces within the left hemithorax and associated volume loss with trace effusion. Could reflect postprocedural changes from prior pleurodesis though and malignancy is not excluded. These results were called by telephone at the time of interpretation on 07/31/2018 at 6:15 pm to Dr. Francine Graven, who verbally acknowledged these results. Electronically Signed   By: Lovena Le M.D.   On: 07/31/2018 18:15   Nm Pet Image Initial (pi) Skull Base To Thigh  Result Date: 07/31/2018 CLINICAL DATA:  Initial treatment strategy for ovarian carcinoma. EXAM: NUCLEAR MEDICINE PET SKULL BASE TO THIGH TECHNIQUE: 8.16 mCi F-18 FDG was injected intravenously. Full-ring PET imaging was performed from the skull base to thigh after the radiotracer. CT data was obtained and used for attenuation correction and anatomic localization. Fasting blood glucose: 102 mg/dl COMPARISON:  CT 07/10/2018 FINDINGS: Mediastinal blood pool activity: SUV  max 2.94 Liver activity: SUV max 4 NECK: No hypermetabolic lymph nodes in the neck. Incidental CT findings: none CHEST: Rind of hypermetabolic activity along the medial mediastinum pleural surface of the LEFT lung consistent with talc pleurodesis. There is loculated lung at the LEFT lung base with a rim of hypermetabolic activity also related to pleurodesis. No hypermetabolic mediastinal lymph nodes. Mild metabolic activity associated the mid esophagus (image 71) Incidental CT findings: No suspicious pulmonary nodules. PleurX catheter in place. ABDOMEN/PELVIS: There is hypermetabolic tissue associated with the enlarged ovaries. The hypermetabolic portion corresponds to the thickened soft tissue component along the walls of the cystic ovarian masses. The RIGHT ovarian mass measures 5.8 Cm with SUV max equal 6.7 along the soft tissue wall thickening portion. The cystic portion is non metabolic. The LEFT ovary is smaller measuring 4.3 cm with hypermetabolic tissue medially similar to the RIGHT. There is a rim of hypermetabolic activity along the inferior margin of the RIGHT hepatic lobe which corresponds to subtle peritoneal thickening. This metabolic activity is above background liver activity  with SUV max equal 5.0 compared to 3.0 in the background liver. A similar hypermetabolic peritoneal thickening in the LEFT ventral peritoneal surface with SUV max equal 3.4 on image 112. Mild hypermetabolic nodularity in the greater omentum with SUV max equal 3.0 on image 113. Incidental CT findings: There is a large splenic hematoma superior to the spleen confined superiorly by the LEFT hemidiaphragm. This large round hematoma measures 10.5 x 11.3 by 9.0 cm. Within the hematoma is bounded inferiorly by the spleen superiorly by the diaphragm as seen on coronal image 67/126 and PET-CT image 131. SKELETON: No focal hypermetabolic activity to suggest skeletal metastasis. Incidental CT findings: none IMPRESSION: 1. Hypermetabolic  tissue associated with cystic masses of the LEFT RIGHT ovary consistent with ovarian carcinoma. 2. Evidence of carcinoma metastasis with rind of hypermetabolic tissue along the inferior margin of liver RIGHT hepatic lobe as well as on along the ventral LEFT peritoneal space and greater omentum. 3. Hypermetabolic tissue in the LEFT pleural space is related to talc pleurodesis. Cannot exclude underlying malignancy. 4. Incidental finding of a large hematoma in LEFT upper quadrant bound superiorly by the LEFT hemidiaphragm and inferiorly by the spleen. Recommend CTA of the abdomen to evaluate for persistent hemorrhage from the spleen and surgical consultation. Findings conveyed toSREEDHAR KATRAGADDA on 07/31/2018  at13:20. Electronically Signed   By: Suzy Bouchard M.D.   On: 07/31/2018 13:31   Dg Chest Port 1 View  Result Date: 07/31/2018 CLINICAL DATA:  PleurX, effusion, suspected intra-abdominal hemorrhage on PET-CT EXAM: PORTABLE CHEST 1 VIEW COMPARISON:  Same day PET-CT, chest radiograph, 07/18/2018 FINDINGS: No significant change in chest radiograph with a moderate left pleural effusion and associated atelectasis or consolidation, with a tunneled left-sided chest catheter in position about the posterior pleural space. The right lung is normally aerated. Cardiomegaly. IMPRESSION: No significant change in chest radiograph with a moderate left pleural effusion and associated atelectasis or consolidation, with a tunneled left-sided chest catheter in position about the posterior pleural space. The right lung is normally aerated. Cardiomegaly. Electronically Signed   By: Eddie Candle M.D.   On: 07/31/2018 15:37   Ct Angio Abd/pel W And/or Wo Contrast  Result Date: 07/31/2018 CLINICAL DATA:  Operative complication of a Pleurx catheter placement. EXAM: CT ANGIOGRAPHY CHEST, ABDOMEN AND PELVIS TECHNIQUE: Multidetector CT imaging through the chest, abdomen and pelvis was performed using the standard protocol during  bolus administration of intravenous contrast. Multiplanar reconstructed images and MIPs were obtained and reviewed to evaluate the vascular anatomy. CONTRAST:  154m OMNIPAQUE IOHEXOL 350 MG/ML SOLN COMPARISON:  Same day PET-CT FINDINGS: CTA CHEST FINDINGS Cardiovascular: Preferential opacification of the thoracic aorta. Evaluation of the aortic root limited due to cardiac pulsation artifact. No convincing evidence of thoracic aortic aneurysm or dissection. Normal heart size. No pericardial effusion. Mediastinum/Nodes: Several reactive but not pathologically enlarged mediastinal and left hilar nodes are present, likely reactive to the adjacent inflammatory features given absence of uptake on PET-CT. Similarly a subcentimeter hypoattenuating nodule in the left lobe thyroid without uptake on comparison PET images is likely benign. There is nonspecific thickening of the mid esophagus and a moderate hiatal hernia. The airways are diffusely thickened with scattered secretions. There is extension of a large perisplenic hematoma through the diaphragmatic hiatus and into the lower posterior mediastinum. Lungs/Pleura: Hypoventilatory changes are present along with dependent atelectasis posteriorly. Nodular, rind-like pleural thickening throughout the left hemithorax and within the fissures is similar to PET-CT. No suspicious nodules. A small, loculated left pleural effusion  is again seen. Left Pleurx catheter in place terminating along the left mediastinal border directed apically. Musculoskeletal: No chest wall mass or suspicious bone lesions identified. Review of the MIP images confirms the above findings. CTA ABDOMEN AND PELVIS FINDINGS VASCULAR Aorta: The aorta is normal caliber. No intramural hematoma, dissection flap or other luminal abnormality of the aorta is seen. No periaortic stranding or hemorrhage. Minimal atheromatous plaque near the aortic bifurcation. No aneurysm or ectasia. Celiac: Patent without evidence of  aneurysm, dissection, vasculitis or significant stenosis. SMA: Patent without evidence of aneurysm, dissection, vasculitis or significant stenosis. Renals: Both renal arteries are patent without evidence of aneurysm, dissection, vasculitis, fibromuscular dysplasia or significant stenosis. IMA: Patent without evidence of aneurysm, dissection, vasculitis or significant stenosis. Inflow: Patent without evidence of aneurysm, dissection, vasculitis or significant stenosis. Veins: No obvious venous abnormality within the limitations of this arterial phase study. Review of the MIP images confirms the above findings. NON-VASCULAR Hepatobiliary: No focal liver abnormality or hepatic injury is seen. Small amount of perihepatic hemorrhage likely tracking from the site of splenic injury. Patient is post cholecystectomy. Slight prominence of the biliary tree likely related to reservoir effect. No calcified intraductal gallstones. Pancreas: Unremarkable. No pancreatic ductal dilatation or surrounding inflammatory changes. Spleen: There is a 3.9 cm splenic laceration with a faint central hyperattenuating focus which remains stable on precontrast images from PET-CT as well as post-contrast venous phase and delayed images. There is surrounding mixed attenuation hemorrhage. This capsular hemorrhage partially protrudes into the diaphragmatic hiatus (axial series 12, image 7). Extracapsular hemorrhage is present as well, partially decompressing into the lesser sac along the greater curvature of the stomach. Additional hemoperitoneum is present in the right perihepatic space, both pericolic gutters and layering within the pelvis. Adrenals/Urinary Tract: No adrenal hemorrhage or renal injury identified. Bladder is unremarkable. Stomach/Bowel: Moderate hiatal hernia. Fluid collection along the greater curvature of the Lymphatic: No enlarged abdominopelvic lymph nodes. Reproductive: Bilateral thick-walled cystic lesions within both  enlarged ovaries compatible with patient's known ovarian neoplasms. Anteverted uterus. Other: Hemoperitoneum, as detailed splenic section above. No free gas. Extensive omental caking and thick walled enhancement of the anterior peritoneal surfaces. Musculoskeletal: Diffuse body wall edema. No acute osseous abnormality or suspicious osseous lesion. Postsurgical changes from PleurX catheter placement with stranding in the left anterior flank. Catheter enters at the anterior left sixth interspace. Review of the MIP images confirms the above findings. IMPRESSION: 3.9 cm splenic laceration with central hyperattenuation concerning for contained vascular injury with capsular hematoma extending over greater than 80% of the splenic surface and with intraperitoneal extension of hemorrhage. Findings are compatible with a AAST grade V splenic injury. Urgent surgical consultation is warranted. PleurX catheter in the left 6th interspace, anterior axillary line and terminates along the mediastinal border directed apically. Redemonstration of the complex thick-walled cystic lesions in both adnexa, compatible with known ovarian neoplasm. Extensive omental caking is present as well as thickening along the anterior liver. Rind like thickening of the pleural surfaces within the left hemithorax and associated volume loss with trace effusion. Could reflect postprocedural changes from prior pleurodesis though and malignancy is not excluded. These results were called by telephone at the time of interpretation on 07/31/2018 at 6:15 pm to Dr. Francine Graven, who verbally acknowledged these results. Electronically Signed   By: Lovena Le M.D.   On: 07/31/2018 18:15     Assessment & Plan:    Principal Problem:   Splenic laceration Active Problems:   Tobacco abuse  Ovarian cancer, bilateral ([place])  Splenic laceration NPO after [facility] Surgery consult by ED, appreciate input IR consult ordered in computer as recommended by  surgery Type and screen Check cbc at 11pm and 5 am in morning  Nausea zofran 25m iv q6h prn   Gerd Cont PPI  Abnormal liver function (elevation in Alk phos) Likely secondary to ovarian cancer Check cmp in am  H/o VATS Cont oxycodone prn   Ovarian cancer (presumed) Please f/u with [facility]  Tobacco use Pt states she has quit, congratulated her.   Anxiety Cont Valium 567mpo qhs, prn  Migraines Hold imitrex for now    DVT Prophylaxis-  - SCDs  AM Labs Ordered, also please review Full Orders  Family Communication: Admission, patients condition and plan of care including tests being ordered have been discussed with the patient  who indicate understanding and agree with the plan and Code Status.  Code Status:  FULL CODE, [facility] message with [facility]:  Inpatient: Based on patients clinical presentation and evaluation of above clinical data, I have made determination that patient meets Inpatient criteria at this time.  Pt has high risk of clinical decline from splenic laceration. Will require close monitoring and possible IR intervention or surgical intervention.   Time spent in minutes : 70 minutes   JaJani Gravel.D on 07/31/2018 at 7:33 PM

## 2018-07-31 NOTE — ED Provider Notes (Signed)
MSE was initiated and I personally evaluated the patient and placed orders (if any) at  2:54 PM on July 31, 2018.  The patient appears stable so that the remainder of the MSE may be completed by another provider.  The patient is a very pleasant 48 year old female who unfortunately has recently been diagnosed with metastatic ovarian cancer, she had recurrent pleural effusions that ultimately required talc pleurodesis and a Pleurx catheter, she presented recently with a hemoglobin of 4 and required a transfusion, on a follow-up PET scan she was seen to have a possible small hematoma underneath the left diaphragm above the spleen.  The oncologist has called me and requested a more broad work-up including a CT angiogram of the abdomen and pelvis to look for ongoing bleeding and to see if there is worsening anemia.  These test have been ordered, the patient is stable for further evaluation by the oncoming physician who will be here shortly.  It was recommended that if there was nothing significantly changed that she could continue her work-up as an outpatient.   Noemi Chapel, MD 07/31/18 1455

## 2018-08-01 ENCOUNTER — Inpatient Hospital Stay (HOSPITAL_COMMUNITY): Payer: Medicaid Other

## 2018-08-01 ENCOUNTER — Encounter (HOSPITAL_COMMUNITY): Payer: Self-pay | Admitting: Interventional Radiology

## 2018-08-01 DIAGNOSIS — F419 Anxiety disorder, unspecified: Secondary | ICD-10-CM

## 2018-08-01 HISTORY — PX: IR ANGIOGRAM SELECTIVE EACH ADDITIONAL VESSEL: IMG667

## 2018-08-01 HISTORY — PX: IR US GUIDE VASC ACCESS RIGHT: IMG2390

## 2018-08-01 HISTORY — PX: IR EMBO ART  VEN HEMORR LYMPH EXTRAV  INC GUIDE ROADMAPPING: IMG5450

## 2018-08-01 HISTORY — PX: IR ANGIOGRAM VISCERAL SELECTIVE: IMG657

## 2018-08-01 LAB — CBC WITH DIFFERENTIAL/PLATELET
Abs Immature Granulocytes: 0.04 10*3/uL (ref 0.00–0.07)
Basophils Absolute: 0.1 10*3/uL (ref 0.0–0.1)
Basophils Relative: 1 %
Eosinophils Absolute: 0.3 10*3/uL (ref 0.0–0.5)
Eosinophils Relative: 3 %
HCT: 36.7 % (ref 36.0–46.0)
Hemoglobin: 11.6 g/dL — ABNORMAL LOW (ref 12.0–15.0)
Immature Granulocytes: 1 %
Lymphocytes Relative: 19 %
Lymphs Abs: 1.7 10*3/uL (ref 0.7–4.0)
MCH: 30.1 pg (ref 26.0–34.0)
MCHC: 31.6 g/dL (ref 30.0–36.0)
MCV: 95.3 fL (ref 80.0–100.0)
Monocytes Absolute: 1 10*3/uL (ref 0.1–1.0)
Monocytes Relative: 11 %
Neutro Abs: 5.8 10*3/uL (ref 1.7–7.7)
Neutrophils Relative %: 65 %
Platelets: 667 10*3/uL — ABNORMAL HIGH (ref 150–400)
RBC: 3.85 MIL/uL — ABNORMAL LOW (ref 3.87–5.11)
RDW: 14.3 % (ref 11.5–15.5)
WBC: 8.8 10*3/uL (ref 4.0–10.5)
nRBC: 0 % (ref 0.0–0.2)

## 2018-08-01 LAB — TYPE AND SCREEN
ABO/RH(D): O POS
Antibody Screen: NEGATIVE

## 2018-08-01 MED ORDER — IOHEXOL 300 MG/ML  SOLN
100.0000 mL | Freq: Once | INTRAMUSCULAR | Status: AC | PRN
  Administered 2018-08-01: 40 mL via INTRA_ARTERIAL

## 2018-08-01 MED ORDER — ACETAMINOPHEN 325 MG PO TABS
650.0000 mg | ORAL_TABLET | Freq: Four times a day (QID) | ORAL | Status: DC | PRN
  Administered 2018-08-01 – 2018-08-04 (×2): 650 mg via ORAL
  Filled 2018-08-01 (×2): qty 2

## 2018-08-01 MED ORDER — LIDOCAINE HCL 1 % IJ SOLN
INTRAMUSCULAR | Status: AC | PRN
  Administered 2018-08-01: 10 mL

## 2018-08-01 MED ORDER — HYDROCORTISONE (PERIANAL) 2.5 % EX CREA
TOPICAL_CREAM | Freq: Two times a day (BID) | CUTANEOUS | Status: DC
  Administered 2018-08-01 – 2018-08-04 (×7): via RECTAL
  Filled 2018-08-01: qty 28.35

## 2018-08-01 MED ORDER — HYDROMORPHONE HCL 1 MG/ML IJ SOLN
0.5000 mg | INTRAMUSCULAR | Status: DC | PRN
  Administered 2018-08-01: 0.5 mg via INTRAVENOUS
  Filled 2018-08-01: qty 1

## 2018-08-01 MED ORDER — FENTANYL CITRATE (PF) 100 MCG/2ML IJ SOLN
INTRAMUSCULAR | Status: AC
  Filled 2018-08-01: qty 2

## 2018-08-01 MED ORDER — POLYETHYLENE GLYCOL 3350 17 G PO PACK
17.0000 g | PACK | Freq: Two times a day (BID) | ORAL | Status: DC
  Administered 2018-08-01 – 2018-08-04 (×5): 17 g via ORAL
  Filled 2018-08-01 (×6): qty 1

## 2018-08-01 MED ORDER — MIDAZOLAM HCL 2 MG/2ML IJ SOLN
INTRAMUSCULAR | Status: AC | PRN
  Administered 2018-08-01 (×3): 1 mg via INTRAVENOUS

## 2018-08-01 MED ORDER — LIDOCAINE HCL 1 % IJ SOLN
INTRAMUSCULAR | Status: AC
  Filled 2018-08-01: qty 20

## 2018-08-01 MED ORDER — FENTANYL CITRATE (PF) 100 MCG/2ML IJ SOLN
INTRAMUSCULAR | Status: AC | PRN
  Administered 2018-08-01: 50 ug via INTRAVENOUS
  Administered 2018-08-01 (×2): 25 ug via INTRAVENOUS

## 2018-08-01 MED ORDER — MIDAZOLAM HCL 2 MG/2ML IJ SOLN
INTRAMUSCULAR | Status: AC
  Filled 2018-08-01: qty 4

## 2018-08-01 NOTE — Sedation Documentation (Signed)
Gauze/tegaderm bandage applied to R fem art puncture. Groin level 0, 3+RDP, 3+RPT.

## 2018-08-01 NOTE — Consult Note (Addendum)
Chief Complaint: Patient was seen in consultation today for  Chief Complaint  Patient presents with   Abnormal Lab    hitory of pleural effusion   at the request of Annye English MD  Referring Physician(s): Annye English MD  Supervising Physician: Arne Cleveland  Patient Status: [facility] - In-pt  History of Present Illness: Michelle Holt is a 48 y.o. female recently diagnosed with met ovarian carcinoma, presented initially with dyspnea, now s/p L pleurx chest drain placement 6/26  CT A/P 6.26 showed: 1. bilateral ovaries are enlarged by heterogeneous appearing cystic lesions, measuring 5.3 x 4.2 cm on the right (series 4, image 72) and 4.5 x 3.2 cm on the left (series 4, image 75). Consider dedicated pelvic ultrasound and/or pelvic MRI to further evaluate for solid components given high suspicion for GYN primary malignancy.  2. No other evidence of mass and no lymphadenopathy in the abdomen or pelvis.  3. Trace ascites. There is some suggestion of omental and peritoneal nodularity (e.g. Series 4, image 56), concerning for peritoneal metastatic disease.  4. Loculated left-sided pleural effusion with left-sided pleural drainage catheter in position. Small right pleural effusion.  Underwent  thoracoscopy/ pleurodesis for effusion 07/13/2018, had post procedure pain . EGD 07/20/2018 showed  LA Grade C reflux esophagitis,- Non-bleeding gastric ulcer with no stigmata of bleeding, - Gastritis, Duodenal erosions without bleeding. Cpntinued LUQ pain and anemia, with response to transfusion.  Underwent diagnostic CT PET for staging of ovarian CA yesterday: 1. Hypermetabolic tissue associated with cystic masses of the LEFT RIGHT ovary consistent with ovarian carcinoma. 2. Evidence of carcinoma metastasis with rind of hypermetabolic tissue along the inferior margin of liver RIGHT hepatic lobe as well as on along the ventral LEFT peritoneal space and greater omentum. 3.  Hypermetabolic tissue in the LEFT pleural space is related to talc pleurodesis. Cannot exclude underlying malignancy. 4. Incidental finding of a large hematoma in LEFT upper quadrant bound superiorly by the LEFT hemidiaphragm and inferiorly by the spleen. Recommend CTA of the abdomen to evaluate for persistent hemorrhage from the spleen and surgical consultation.    Then underwent CTA C/A/P today: 3.9 cm splenic laceration with central hyperattenuation concerning for contained vascular injury with capsular hematoma extending over greater than 80% of the splenic surface and with intraperitoneal extension of hemorrhage. Findings are compatible with a AAST grade V splenic injury. Urgent surgical consultation is warranted.  PleurX catheter in the left 6th interspace, anterior axillary line and terminates along the mediastinal border directed apically.  Redemonstration of the complex thick-walled cystic lesions in both adnexa, compatible with known ovarian neoplasm. Extensive omental caking is present as well as thickening along the anterior liver.  Rind like thickening of the pleural surfaces within the left hemithorax and associated volume loss with trace effusion. Could reflect postprocedural changes from prior pleurodesis though and malignancy is not excluded.   She was sent to [facility] [facility] whom requested transfer to [facility]  She denies any changes in her abdominal pain in the last week. Reports stable discomfort since her last hospitalization. Denies weakness/fatigue currently. Denies n/v. H/H stable.   Past Medical History:  Diagnosis Date   Anxiety and depression    Arthritis of facet joints at multiple vertebral levels    L5-S1   Constipation    Dyslipidemia    Family history of breast cancer    Family history of uterine cancer    Insomnia    Irritable bowel syndrome  Migraine    Muscle tension headache    Ovarian carcinoma (HCC)    Plantar fasciitis  of right foot     Past Surgical History:  Procedure Laterality Date   CHOLECYSTECTOMY  2008   COLONOSCOPY N/A 08/13/2013   Procedure: COLONOSCOPY;  Surgeon: Rogene Houston, MD;  [facility];  Service: Endoscopy;  Laterality: N/A;  230-moved to 145 Ann to notify pt   ESOPHAGOGASTRODUODENOSCOPY     ESOPHAGOGASTRODUODENOSCOPY (EGD) WITH PROPOFOL N/A 07/20/2018   Procedure: ESOPHAGOGASTRODUODENOSCOPY (EGD) WITH PROPOFOL;  Surgeon: Rogene Houston, MD;  Location[facility];  Service: Endoscopy;  Laterality: N/A;  Possible esophageal dilation.   IR PERC PLEURAL DRAIN W/INDWELL CATH W/IMG GUIDE  07/08/2018   IR THORACENTESIS ASP PLEURAL SPACE W/IMG GUIDE  07/07/2018   PLEURAL EFFUSION DRAINAGE Left 07/13/2018   Procedure: DRAINAGE OF LOCULATED PLEURAL EFFUSION;  Surgeon: Ivin Poot, MD;  Location: [place];  Service: Thoracic;  Laterality: Left;   TALC PLEURODESIS Left 07/13/2018   Procedure: Talc Pleuradesis;  Surgeon: Prescott Gum, Collier Salina, MD;  Location: [place];  Service: Thoracic;  Laterality: Left;   TUBAL LIGATION Bilateral    UTERINE ABLATION     VIDEO ASSISTED THORACOSCOPY Left 07/13/2018   Procedure: VIDEO ASSISTED THORACOSCOPY;  Surgeon: Ivin Poot, MD;  Location: [place];  Service: Thoracic;  Laterality: Left;    Allergies: Morphine and related, Nortriptyline, Topamax [topiramate], Actifed cold-allergy [chlorpheniramine-phenyleph er], Amoxicillin, Codeine, Erythromycin, Penicillins, and Sudafed [pseudoephedrine hcl]  Medications: Prior to Admission medications   Medication Sig Start Date End Date Taking? Authorizing Provider  diazepam (VALIUM) 5 MG tablet TAKE ONE TABLET BY MOUTH AT BEDTIME AS NEEDED FOR ANXIETY 07/27/18  Yes Kathrynn Ducking, MD  docusate sodium (COLACE) 100 MG capsule Take 1 capsule (100 mg total) by mouth 2 (two) times daily. 07/16/18 10/14/18 Yes Donne Hazel, MD  oxyCODONE 10 MG TABS Take 1 tablet (10 mg total) by mouth every 4 (four)  hours as needed for severe pain or breakthrough pain. 07/22/18  Yes Derek Jack, MD  pantoprazole (PROTONIX) 40 MG tablet Take 1 tablet (40 mg total) by mouth 2 (two) times daily before a meal. 07/21/18 08/20/18 Yes Shah, Pratik D, DO  promethazine (PHENERGAN) 25 MG tablet Take 1 tablet (25 mg total) by mouth every 6 (six) hours as needed for nausea, vomiting or refractory nausea / vomiting. 07/16/18  Yes Donne Hazel, MD  senna-docusate (SENOKOT-S) 8.6-50 MG tablet Take 1 tablet by mouth at bedtime. 07/16/18 08/15/18 Yes Donne Hazel, MD  SUMAtriptan (IMITREX) 100 MG tablet TAKE ONE TABLET BY MOUTH PRN UP to TWICE DAILY AS NEEDED. Patient taking differently: Take 100 mg by mouth 2 (two) times daily as needed for migraine.  02/11/18  Yes Kathrynn Ducking, MD  benzonatate (TESSALON PERLES) 100 MG capsule Take 1 capsule (100 mg total) by mouth 3 (three) times daily as needed for cough. Patient not taking: Reported on 07/31/2018 07/16/18 07/16/19  Donne Hazel, MD     Family History  Problem Relation Age of Onset   Hypertension Mother    Obesity Mother    Diabetes Mother    Kidney disease Mother    Peripheral vascular disease Father    Atrial fibrillation Father    COPD Brother    Osteoporosis Brother    Crohn's disease Sister    Uterine cancer Sister 15       maternal half sister   Breast cancer Maternal Aunt  50   Colon cancer Neg Hx     Social History   Socioeconomic History   Marital status: Widowed    Spouse name: Not on file   Number of children: 2   Years of education: 2-College   Highest education level: Not on file  Occupational History    Employer: [employer]  Social Needs   Financial resource strain: Not hard at all   Food insecurity    Worry: Never true    Inability: Never true   Transportation needs    Medical: No    Non-medical: No  Tobacco Use   Smoking status: Former Smoker    Packs/day: 0.50    Years: 17.00    Pack years: 8.50    Types:  Cigarettes    Quit date: 06/22/2018    Years since quitting: 0.1   Smokeless tobacco: Never Used  Substance and Sexual Activity   Alcohol use: Yes    Alcohol/week: 1.0 standard drinks    Types: 1 Glasses of wine per week    Comment: Drinks alcohol on occasion   Drug use: No   Sexual activity: Not on file  Lifestyle   Physical activity    Days per week: 0 days    Minutes per session: 0 min   Stress: Very much  Relationships   Social connections    Talks on phone: More than three times a week    Gets together: More than three times a week    Attends religious service: 1 to 4 times per year    Active member of club or organization: No    Attends meetings of clubs or organizations: Never    Relationship status: Widowed  Other Topics Concern   Not on file  Social History Narrative   Patient lives at home with her daughter.    Patient has 2 children.    Patient is widowed.    Patient is right handed.    Patient has her Associates degree.       ECOG Status: 1 - Symptomatic but completely ambulatory  Review of Systems: A 12 point ROS discussed and pertinent positives are indicated in the HPI above.  All other systems are negative.  Review of Systems  Vital Signs: BP (!) 82/69 (BP Location: Right Arm)    Pulse 89    Temp 98.6 F (37 C) (Oral)    Resp 19    Ht _0  (1.676 m)    Wt 72.5 kg    SpO2 95%    BMI 25.81 kg/m   Physical Exam Constitutional: Oriented to person, place, and time. Well-developed and well-nourished. No distress.  Last Weight  Most recent update: 07/31/2018 10:51 PM   Weight  72.5 kg (159 lb 14.4 oz)           HENT:  Head: Normocephalic and atraumatic.  Eyes: Conjunctivae and EOM are normal. Right eye exhibits no discharge. Left eye exhibits no discharge. No scleral icterus.  Neck: No JVD present.  Pulmonary/Chest: Effort normal. No stridor. No respiratory distress. Left pleurx catheter.  Abdomen: soft, non distended Neurological:  alert  and oriented to person, place, and time.  Skin: Skin is warm and dry.  not diaphoretic.  Psychiatric:   normal mood and affect.   behavior is normal. Judgment and thought content normal.   Imaging: Dg Chest 1 View  Result Date: 07/07/2018 CLINICAL DATA:  Post thoracentesis on the left. EXAM: CHEST  1 VIEW COMPARISON:  Radiographs 07/03/2018 and 06/24/2016.  CT 07/06/2018. FINDINGS: There has been partial re-expansion of the left lung following thoracentesis. There is still a sizable left pleural effusion with near complete whiteout of the left hemithorax. No residual mediastinal shift or pneumothorax identified. The right lung is clear. IMPRESSION: No pneumothorax following thoracentesis on the left. Residual sizable left pleural effusion and left lung collapse. Electronically Signed   By: Richardean Sale M.D.   On: 07/07/2018 10:31   Dg Chest 2 View  Result Date: 07/18/2018 CLINICAL DATA:  Patient with generalized weakness and fever. History of ovarian carcinoma. EXAM: CHEST - 2 VIEW COMPARISON:  Chest radiograph 07/16/2018 FINDINGS: Stable enlarged cardiac and mediastinal contours. Left chest tube remains in position. Persistent moderate left pleural effusion. Similar-appearing patchy consolidation to the left mid and upper lung. No definite pneumothorax. IMPRESSION: Left chest tube remains in position. No definite pneumothorax. Persistent moderate left pleural effusion with underlying consolidation. Electronically Signed   By: Lovey Newcomer M.D.   On: 07/18/2018 15:03   Dg Chest 2 View  Result Date: 07/16/2018 CLINICAL DATA:  Follow-up chest tube removal EXAM: CHEST - 2 VIEW COMPARISON:  07/15/2018 FINDINGS: Cardiac shadow is stable. Right-sided PICC line is again seen and stable. One of the 2 left-sided chest tubes has been removed in the interval. No pneumothorax is identified. Persistent left basilar atelectasis and effusion is seen. IMPRESSION: Interval chest tube removal on the left without  recurrent pneumothorax. Stable atelectatic changes and effusion in the left base. Electronically Signed   By: Inez Catalina M.D.   On: 07/16/2018 08:29   Dg Chest 2 View  Result Date: 07/08/2018 CLINICAL DATA:  Thoracentesis, left-sided chest pain EXAM: CHEST - 2 VIEW COMPARISON:  07/07/2018 FINDINGS: No significant change in a large left pleural effusion, with near-total consolidation of the left lung. There is no acute appearing airspace opacity of the aerated portion of the left pulmonary apex. No significant pneumothorax. The right lung is normally aerated. The cardiac silhouette is predominantly obscured. Disc degenerative disease of the thoracic spine IMPRESSION: No significant change in a large left pleural effusion, with near-total consolidation of the left lung. There is no acute appearing airspace opacity of the aerated portion of the left pulmonary apex. No significant pneumothorax. The right lung is normally aerated. Electronically Signed   By: Eddie Candle M.D.   On: 07/08/2018 09:37   Ct Chest W Contrast  Result Date: 07/09/2018 CLINICAL DATA:  Large, reaccumulating left pleural effusion EXAM: CT CHEST WITH CONTRAST TECHNIQUE: Multidetector CT imaging of the chest was performed during intravenous contrast administration. CONTRAST:  84m OMNIPAQUE IOHEXOL 300 MG/ML  SOLN COMPARISON:  Chest radiograph, 07/09/2018 FINDINGS: Cardiovascular: No significant vascular findings. Normal heart size. No pericardial effusion. Mediastinum/Nodes: There are prominent although not pathologically enlarged mediastinal lymph nodes. Moderate hiatal hernia. Thyroid gland, trachea, and esophagus demonstrate no significant findings. Lungs/Pleura: There is a moderate, loculated left hydropneumothorax with a small air component and moderate fluid component. The largest loculated component is located posteriorly. There is a pigtail drainage catheter about the lateral pleural space. There is a small right pleural effusion  with associated atelectasis or consolidation and a subpleural consolidation of the superior segment right lower lobe (series 4, image 56). Upper Abdomen: No acute abnormality. Musculoskeletal: No chest wall mass or suspicious bone lesions identified. IMPRESSION: 1. There is a moderate, loculated left hydropneumothorax with a small air component and moderate fluid component. The largest loculated component is located posteriorly. There is a pigtail drainage catheter about the lateral  pleural space. There is no obvious etiology, such as obvious mass or pleural disease. 2. There is a small right pleural effusion with associated atelectasis or consolidation and a subpleural consolidation of the superior segment right lower lobe (series 4, image 56), of uncertain significance, possibly infectious or inflammatory. Electronically Signed   By: Eddie Candle M.D.   On: 07/09/2018 11:31   Ct Angio Chest Pe W And/or Wo Contrast  Result Date: 07/31/2018 CLINICAL DATA:  Operative complication of a Pleurx catheter placement. EXAM: CT ANGIOGRAPHY CHEST, ABDOMEN AND PELVIS TECHNIQUE: Multidetector CT imaging through the chest, abdomen and pelvis was performed using the standard protocol during bolus administration of intravenous contrast. Multiplanar reconstructed images and MIPs were obtained and reviewed to evaluate the vascular anatomy. CONTRAST:  140m OMNIPAQUE IOHEXOL 350 MG/ML SOLN COMPARISON:  Same day PET-CT FINDINGS: CTA CHEST FINDINGS Cardiovascular: Preferential opacification of the thoracic aorta. Evaluation of the aortic root limited due to cardiac pulsation artifact. No convincing evidence of thoracic aortic aneurysm or dissection. Normal heart size. No pericardial effusion. Mediastinum/Nodes: Several reactive but not pathologically enlarged mediastinal and left hilar nodes are present, likely reactive to the adjacent inflammatory features given absence of uptake on PET-CT. Similarly a subcentimeter  hypoattenuating nodule in the left lobe thyroid without uptake on comparison PET images is likely benign. There is nonspecific thickening of the mid esophagus and a moderate hiatal hernia. The airways are diffusely thickened with scattered secretions. There is extension of a large perisplenic hematoma through the diaphragmatic hiatus and into the lower posterior mediastinum. Lungs/Pleura: Hypoventilatory changes are present along with dependent atelectasis posteriorly. Nodular, rind-like pleural thickening throughout the left hemithorax and within the fissures is similar to PET-CT. No suspicious nodules. A small, loculated left pleural effusion is again seen. Left Pleurx catheter in place terminating along the left mediastinal border directed apically. Musculoskeletal: No chest wall mass or suspicious bone lesions identified. Review of the MIP images confirms the above findings. CTA ABDOMEN AND PELVIS FINDINGS VASCULAR Aorta: The aorta is normal caliber. No intramural hematoma, dissection flap or other luminal abnormality of the aorta is seen. No periaortic stranding or hemorrhage. Minimal atheromatous plaque near the aortic bifurcation. No aneurysm or ectasia. Celiac: Patent without evidence of aneurysm, dissection, vasculitis or significant stenosis. SMA: Patent without evidence of aneurysm, dissection, vasculitis or significant stenosis. Renals: Both renal arteries are patent without evidence of aneurysm, dissection, vasculitis, fibromuscular dysplasia or significant stenosis. IMA: Patent without evidence of aneurysm, dissection, vasculitis or significant stenosis. Inflow: Patent without evidence of aneurysm, dissection, vasculitis or significant stenosis. Veins: No obvious venous abnormality within the limitations of this arterial phase study. Review of the MIP images confirms the above findings. NON-VASCULAR Hepatobiliary: No focal liver abnormality or hepatic injury is seen. Small amount of perihepatic  hemorrhage likely tracking from the site of splenic injury. Patient is post cholecystectomy. Slight prominence of the biliary tree likely related to reservoir effect. No calcified intraductal gallstones. Pancreas: Unremarkable. No pancreatic ductal dilatation or surrounding inflammatory changes. Spleen: There is a 3.9 cm splenic laceration with a faint central hyperattenuating focus which remains stable on precontrast images from PET-CT as well as post-contrast venous phase and delayed images. There is surrounding mixed attenuation hemorrhage. This capsular hemorrhage partially protrudes into the diaphragmatic hiatus (axial series 12, image 7). Extracapsular hemorrhage is present as well, partially decompressing into the lesser sac along the greater curvature of the stomach. Additional hemoperitoneum is present in the right perihepatic space, both pericolic gutters and layering within  the pelvis. Adrenals/Urinary Tract: No adrenal hemorrhage or renal injury identified. Bladder is unremarkable. Stomach/Bowel: Moderate hiatal hernia. Fluid collection along the greater curvature of the Lymphatic: No enlarged abdominopelvic lymph nodes. Reproductive: Bilateral thick-walled cystic lesions within both enlarged ovaries compatible with patient's known ovarian neoplasms. Anteverted uterus. Other: Hemoperitoneum, as detailed splenic section above. No free gas. Extensive omental caking and thick walled enhancement of the anterior peritoneal surfaces. Musculoskeletal: Diffuse body wall edema. No acute osseous abnormality or suspicious osseous lesion. Postsurgical changes from PleurX catheter placement with stranding in the left anterior flank. Catheter enters at the anterior left sixth interspace. Review of the MIP images confirms the above findings. IMPRESSION: 3.9 cm splenic laceration with central hyperattenuation concerning for contained vascular injury with capsular hematoma extending over greater than 80% of the splenic  surface and with intraperitoneal extension of hemorrhage. Findings are compatible with a AAST grade V splenic injury. Urgent surgical consultation is warranted. PleurX catheter in the left 6th interspace, anterior axillary line and terminates along the mediastinal border directed apically. Redemonstration of the complex thick-walled cystic lesions in both adnexa, compatible with known ovarian neoplasm. Extensive omental caking is present as well as thickening along the anterior liver. Rind like thickening of the pleural surfaces within the left hemithorax and associated volume loss with trace effusion. Could reflect postprocedural changes from prior pleurodesis though and malignancy is not excluded. These results were called by telephone at the time of interpretation on 07/31/2018 at 6:15 pm to Dr. Francine Graven, who verbally acknowledged these results. Electronically Signed   By: Lovena Le M.D.   On: 07/31/2018 18:15   Ct Abdomen Pelvis W Contrast  Result Date: 07/10/2018 CLINICAL DATA:  Large left pleural effusion, suspect gyn primary malignancy EXAM: CT ABDOMEN AND PELVIS WITH CONTRAST TECHNIQUE: Multidetector CT imaging of the abdomen and pelvis was performed using the standard protocol following bolus administration of intravenous contrast. CONTRAST:  164m OMNIPAQUE IOHEXOL 300 MG/ML  SOLN COMPARISON:  CT chest, 07/09/2018, CT abdomen pelvis, 05/10/2011 FINDINGS: Lower chest: Loculated left-sided pleural effusion with left-sided pleural drainage catheter in position. Small right pleural effusion. Hepatobiliary: No focal liver abnormality is seen. Status post cholecystectomy. No biliary dilatation. Pancreas: Unremarkable. No pancreatic ductal dilatation or surrounding inflammatory changes. Spleen: Normal in size without significant abnormality. Adrenals/Urinary Tract: Adrenal glands are unremarkable. Kidneys are normal, without renal calculi, solid lesion, or hydronephrosis. Bladder is unremarkable.  Stomach/Bowel: Stomach is within normal limits. Appendix appears normal. No evidence of bowel wall thickening, distention, or inflammatory changes. Large burden of stool in the colon. Vascular/Lymphatic: No significant vascular findings are present. No enlarged abdominal or pelvic lymph nodes. Reproductive: The bilateral ovaries are enlarged by heterogeneous appearing cystic lesions, measuring 5.3 x 4.2 cm on the right (series 4, image 72) and 4.5 x 3.2 cm on the left (series 4, image 75). Other: No abdominal wall hernia or abnormality. Trace ascites. There is some suggestion of omental and peritoneal nodularity (e.g. Series 4, image 43). Musculoskeletal: No acute or significant osseous findings. IMPRESSION: 1. The bilateral ovaries are enlarged by heterogeneous appearing cystic lesions, measuring 5.3 x 4.2 cm on the right (series 4, image 72) and 4.5 x 3.2 cm on the left (series 4, image 75). Consider dedicated pelvic ultrasound and/or pelvic MRI to further evaluate for solid components given high suspicion for GYN primary malignancy. 2. No other evidence of mass and no lymphadenopathy in the abdomen or pelvis. 3. Trace ascites. There is some suggestion of omental and peritoneal nodularity (  e.g. Series 4, image 58), concerning for peritoneal metastatic disease. 4. Loculated left-sided pleural effusion with left-sided pleural drainage catheter in position. Small right pleural effusion. Electronically Signed   By: Eddie Candle M.D.   On: 07/10/2018 21:14   Nm Pet Image Initial (pi) Skull Base To Thigh  Result Date: 07/31/2018 CLINICAL DATA:  Initial treatment strategy for ovarian carcinoma. EXAM: NUCLEAR MEDICINE PET SKULL BASE TO THIGH TECHNIQUE: 8.16 mCi F-18 FDG was injected intravenously. Full-ring PET imaging was performed from the skull base to thigh after the radiotracer. CT data was obtained and used for attenuation correction and anatomic localization. Fasting blood glucose: 102 mg/dl COMPARISON:  CT  07/10/2018 FINDINGS: Mediastinal blood pool activity: SUV max 2.94 Liver activity: SUV max 4 NECK: No hypermetabolic lymph nodes in the neck. Incidental CT findings: none CHEST: Rind of hypermetabolic activity along the medial mediastinum pleural surface of the LEFT lung consistent with talc pleurodesis. There is loculated lung at the LEFT lung base with a rim of hypermetabolic activity also related to pleurodesis. No hypermetabolic mediastinal lymph nodes. Mild metabolic activity associated the mid esophagus (image 71) Incidental CT findings: No suspicious pulmonary nodules. PleurX catheter in place. ABDOMEN/PELVIS: There is hypermetabolic tissue associated with the enlarged ovaries. The hypermetabolic portion corresponds to the thickened soft tissue component along the walls of the cystic ovarian masses. The RIGHT ovarian mass measures 5.8 Cm with SUV max equal 6.7 along the soft tissue wall thickening portion. The cystic portion is non metabolic. The LEFT ovary is smaller measuring 4.3 cm with hypermetabolic tissue medially similar to the RIGHT. There is a rim of hypermetabolic activity along the inferior margin of the RIGHT hepatic lobe which corresponds to subtle peritoneal thickening. This metabolic activity is above background liver activity with SUV max equal 5.0 compared to 3.0 in the background liver. A similar hypermetabolic peritoneal thickening in the LEFT ventral peritoneal surface with SUV max equal 3.4 on image 112. Mild hypermetabolic nodularity in the greater omentum with SUV max equal 3.0 on image 113. Incidental CT findings: There is a large splenic hematoma superior to the spleen confined superiorly by the LEFT hemidiaphragm. This large round hematoma measures 10.5 x 11.3 by 9.0 cm. Within the hematoma is bounded inferiorly by the spleen superiorly by the diaphragm as seen on coronal image 67/126 and PET-CT image 131. SKELETON: No focal hypermetabolic activity to suggest skeletal metastasis.  Incidental CT findings: none IMPRESSION: 1. Hypermetabolic tissue associated with cystic masses of the LEFT RIGHT ovary consistent with ovarian carcinoma. 2. Evidence of carcinoma metastasis with rind of hypermetabolic tissue along the inferior margin of liver RIGHT hepatic lobe as well as on along the ventral LEFT peritoneal space and greater omentum. 3. Hypermetabolic tissue in the LEFT pleural space is related to talc pleurodesis. Cannot exclude underlying malignancy. 4. Incidental finding of a large hematoma in LEFT upper quadrant bound superiorly by the LEFT hemidiaphragm and inferiorly by the spleen. Recommend CTA of the abdomen to evaluate for persistent hemorrhage from the spleen and surgical consultation. Findings conveyed toSREEDHAR KATRAGADDA on 07/31/2018  at13:20. Electronically Signed   By: Suzy Bouchard M.D.   On: 07/31/2018 13:31   Dg Chest Port 1 View  Result Date: 07/31/2018 CLINICAL DATA:  PleurX, effusion, suspected intra-abdominal hemorrhage on PET-CT EXAM: PORTABLE CHEST 1 VIEW COMPARISON:  Same day PET-CT, chest radiograph, 07/18/2018 FINDINGS: No significant change in chest radiograph with a moderate left pleural effusion and associated atelectasis or consolidation, with a tunneled left-sided chest catheter  in position about the posterior pleural space. The right lung is normally aerated. Cardiomegaly. IMPRESSION: No significant change in chest radiograph with a moderate left pleural effusion and associated atelectasis or consolidation, with a tunneled left-sided chest catheter in position about the posterior pleural space. The right lung is normally aerated. Cardiomegaly. Electronically Signed   By: Eddie Candle M.D.   On: 07/31/2018 15:37   Dg Chest Port 1 View  Result Date: 07/15/2018 CLINICAL DATA:  Chest 2 EXAM: PORTABLE CHEST 1 VIEW COMPARISON:  Portable exam 9166 hours compared to 07/14/2018 FINDINGS: Pair of LEFT thoracostomy tubes are again identified. RIGHT arm PICC line  tip projects over SVC. Upper normal heart size. Mediastinal contours and pulmonary vascularity normal. Minimal pleural effusion at RIGHT base. Tiny LEFT apex pneumothorax. Decreased atelectasis LEFT lower lobe. Remaining lungs clear. IMPRESSION: Minimal LEFT pleural effusion and tiny LEFT pneumothorax despite thoracostomy tubes. Decreased atelectasis LEFT lower lobe. Electronically Signed   By: Lavonia Dana M.D.   On: 07/15/2018 08:50   Dg Chest Port 1 View  Result Date: 07/14/2018 CLINICAL DATA:  Follow-up pleural effusion EXAM: PORTABLE CHEST 1 VIEW COMPARISON:  07/13/2010 FINDINGS: Cardiac shadow is stable. Two chest tubes are again identified on the left. No definitive pneumothorax is identified. Mild haziness is noted at the left base consistent with small residual effusion. Left retrocardiac atelectasis is again noted. Right-sided PICC line is seen in satisfactory position. IMPRESSION: Stable chest tubes on the left. No pneumothorax is noted. Persistent basilar density is noted consistent with atelectasis and small effusion. Electronically Signed   By: Inez Catalina M.D.   On: 07/14/2018 07:06   Dg Chest Port 1 View  Result Date: 07/13/2018 CLINICAL DATA:  Status post left-sided VATS. EXAM: PORTABLE CHEST 1 VIEW COMPARISON:  July 13, 2018 FINDINGS: The patient has been extubated. The right-sided PICC line is well position. Two left-sided chest tubes are in place. There is no large pneumothorax. Subcutaneous gas is noted along the patient's left flank. A dense retrocardiac opacity is noted. There are small bilateral pleural effusions. No right-sided pneumothorax. No acute osseous abnormality. IMPRESSION: 1. Status post extubation.  Remaining lines and tubes as above. 2. Persistent dense retrocardiac opacity, stable from prior study. 3. Subcutaneous gas along the patient's left flank, slightly increased from prior study. Electronically Signed   By: Constance Holster M.D.   On: 07/13/2018 21:45   Dg  Chest Port 1 View  Addendum Date: 07/13/2018   ADDENDUM REPORT: 07/13/2018 18:14 ADDENDUM: The patient's RN reports that the patient has been extubated at this time. Electronically Signed   By: Genevie Ann M.D.   On: 07/13/2018 18:14   Result Date: 07/13/2018 CLINICAL DATA:  48 year old female status post left VATS today for loculated effusion. Postoperative day zero. EXAM: PORTABLE CHEST 1 VIEW COMPARISON:  07/11/2018 and earlier. FINDINGS: Portable AP supine view at 1801 hours. Two left chest tubes are in place. Both tubes course toward the medial left apex. No pneumothorax identified. Endotracheal tube in place with evidence of a left mainstem bronchus intubation. Stable right IJ PICC line. Lower lung volumes. Stable cardiac size and mediastinal contours. Regressed but not fully resolved dense left lung base opacity. Negative visible bowel gas pattern. Stable cholecystectomy clips. IMPRESSION: 1. Left mainstem bronchus intubation suspected. Withdrawal the ET tube 2 cm and repeat a portable chest. 2. Two left chest tubes in place. No pneumothorax identified. 3. Lower lung volumes.  Mildly improved left lung base ventilation. Electronically Signed: By: Lemmie Evens  [place] Crane M.D. On: 07/13/2018 18:01   Dg Chest Port 1 View  Result Date: 07/11/2018 CLINICAL DATA:  48 year old female with a history of left-sided pleural effusion. EXAM: PORTABLE CHEST 1 VIEW COMPARISON:  07/10/2018, 07/09/2018, CT 07/09/2010 FINDINGS: Cardiomediastinal silhouette unchanged with the left heart border partially obscured by lung/pleural disease. Unchanged left thoracostomy tube. Contour of the medial aspect of the left upper lobe is visualized at the apex, the so-called "luftsichel sign", new from the comparison and representing partial collapse of the left upper lobe. Right lung relatively well aerated. No new confluent airspace disease Unchanged right upper extremity PICC. IMPRESSION: Unchanged left-sided thoracostomy tube, with  hydropneumothorax. The fluid component is relatively unchanged at the left lung base, with new left upper lobe collapse and small gas component at the apex. Opacity at the left lung base likely a combination of pleural fluid/loculated pleural fluid and associated atelectasis/consolidation. Unchanged right upper extremity PICC. Electronically Signed   By: Corrie Mckusick D.O.   On: 07/11/2018 08:53   Dg Chest Port 1 View  Result Date: 07/10/2018 CLINICAL DATA:  Pleural effusion EXAM: PORTABLE CHEST 1 VIEW COMPARISON:  Chest CT and chest radiograph July 09, 2018 FINDINGS: Chest tube position is unchanged. No pneumothorax is demonstrable by radiography. Central catheter tip is in the superior vena cava. Moderate pleural effusion persists on the left with compressive atelectasis in the left lower lobe. No new opacity evident on either side. Heart is upper normal in size, stable, with pulmonary vascularity within normal limits. No adenopathy. No bone lesions. IMPRESSION: Persistent left pleural effusion with left base compressive atelectasis. No new opacity evident. Stable cardiac silhouette. Tube and catheter positions unchanged without demonstrable pneumothorax by radiography. Electronically Signed   By: Lowella Grip III M.D.   On: 07/10/2018 09:33   Dg Chest Port 1 View  Result Date: 07/09/2018 CLINICAL DATA:  Left pleural effusion.  Chest tube in place. EXAM: PORTABLE CHEST 1 VIEW COMPARISON:  PA and lateral chest 07/08/2018. FINDINGS: New right PICC is identified with its tip projecting at the superior cavoatrial junction. The patient also has a new pigtail catheter in the left chest. Small to moderate left pleural effusion is decreased compared to the prior examination. No pneumothorax. Left basilar airspace disease persists. Small focus of discoid atelectasis right mid lung noted. Heart size is upper normal. IMPRESSION: Decreased left pleural effusion with a new chest tube in place. Negative for  pneumothorax. Electronically Signed   By: Inge Rise M.D.   On: 07/09/2018 09:14   Dg Chest Port 1 View  Result Date: 07/07/2018 CLINICAL DATA:  Left-sided pleural effusion status post thoracentesis EXAM: PORTABLE CHEST 1 VIEW COMPARISON:  July 07, 2018 FINDINGS: There is a persistent large left-sided pleural effusion. There is no pneumothorax. The heart is difficult to fully evaluate on this exam. The right lung field is mostly clear. There is some mild volume overload. IMPRESSION: Interval decrease in size of the previously demonstrated left-sided pleural effusion. There is a persistent large left-sided pleural effusion without evidence of a pneumothorax. Electronically Signed   By: Constance Holster M.D.   On: 07/07/2018 23:33   Ir Picc Placement Right >5 Yrs Inc Img Guide  Result Date: 07/08/2018 CLINICAL DATA:  Large left pleural effusion, poor IV access EXAM: PICC PLACEMENT WITH ULTRASOUND AND FLUOROSCOPY FLUOROSCOPY TIME:  18 seconds; 3 mGy TECHNIQUE: After written informed consent was obtained, patient was placed in the supine position on angiographic table. Patency of the right brachial vein was confirmed  with ultrasound with image documentation. An appropriate skin site was determined. Skin site was marked. Region was prepped using maximum barrier technique including cap and mask, sterile gown, sterile gloves, large sterile sheet, and Chlorhexidine as cutaneous antisepsis. The region was infiltrated locally with 1% lidocaine. Under real-time ultrasound guidance, the right brachial vein was accessed with a 21 gauge micropuncture needle; the needle tip within the vein was confirmed with ultrasound image documentation. Needle exchanged over a 018 guidewire for a peel-away sheath, through which a 5-French double-lumen power injectable PICC trimmed to 38cm was advanced, positioned with its tip near the cavoatrial junction. Spot chest radiograph confirms appropriate catheter position. Catheter was  flushed per protocol and secured externally. The patient tolerated procedure well. COMPLICATIONS: COMPLICATIONS none IMPRESSION: 1. Technically successful five [place] double lumen power injectable PICC placement Electronically Signed   By: Lucrezia Europe M.D.   On: 07/08/2018 16:40   Ct Angio Abd/pel W And/or Wo Contrast  Result Date: 07/31/2018 CLINICAL DATA:  Operative complication of a Pleurx catheter placement. EXAM: CT ANGIOGRAPHY CHEST, ABDOMEN AND PELVIS TECHNIQUE: Multidetector CT imaging through the chest, abdomen and pelvis was performed using the standard protocol during bolus administration of intravenous contrast. Multiplanar reconstructed images and MIPs were obtained and reviewed to evaluate the vascular anatomy. CONTRAST:  15m OMNIPAQUE IOHEXOL 350 MG/ML SOLN COMPARISON:  Same day PET-CT FINDINGS: CTA CHEST FINDINGS Cardiovascular: Preferential opacification of the thoracic aorta. Evaluation of the aortic root limited due to cardiac pulsation artifact. No convincing evidence of thoracic aortic aneurysm or dissection. Normal heart size. No pericardial effusion. Mediastinum/Nodes: Several reactive but not pathologically enlarged mediastinal and left hilar nodes are present, likely reactive to the adjacent inflammatory features given absence of uptake on PET-CT. Similarly a subcentimeter hypoattenuating nodule in the left lobe thyroid without uptake on comparison PET images is likely benign. There is nonspecific thickening of the mid esophagus and a moderate hiatal hernia. The airways are diffusely thickened with scattered secretions. There is extension of a large perisplenic hematoma through the diaphragmatic hiatus and into the lower posterior mediastinum. Lungs/Pleura: Hypoventilatory changes are present along with dependent atelectasis posteriorly. Nodular, rind-like pleural thickening throughout the left hemithorax and within the fissures is similar to PET-CT. No suspicious nodules. A small,  loculated left pleural effusion is again seen. Left Pleurx catheter in place terminating along the left mediastinal border directed apically. Musculoskeletal: No chest wall mass or suspicious bone lesions identified. Review of the MIP images confirms the above findings. CTA ABDOMEN AND PELVIS FINDINGS VASCULAR Aorta: The aorta is normal caliber. No intramural hematoma, dissection flap or other luminal abnormality of the aorta is seen. No periaortic stranding or hemorrhage. Minimal atheromatous plaque near the aortic bifurcation. No aneurysm or ectasia. Celiac: Patent without evidence of aneurysm, dissection, vasculitis or significant stenosis. SMA: Patent without evidence of aneurysm, dissection, vasculitis or significant stenosis. Renals: Both renal arteries are patent without evidence of aneurysm, dissection, vasculitis, fibromuscular dysplasia or significant stenosis. IMA: Patent without evidence of aneurysm, dissection, vasculitis or significant stenosis. Inflow: Patent without evidence of aneurysm, dissection, vasculitis or significant stenosis. Veins: No obvious venous abnormality within the limitations of this arterial phase study. Review of the MIP images confirms the above findings. NON-VASCULAR Hepatobiliary: No focal liver abnormality or hepatic injury is seen. Small amount of perihepatic hemorrhage likely tracking from the site of splenic injury. Patient is post cholecystectomy. Slight prominence of the biliary tree likely related to reservoir effect. No calcified intraductal gallstones. Pancreas: Unremarkable. No pancreatic ductal  dilatation or surrounding inflammatory changes. Spleen: There is a 3.9 cm splenic laceration with a faint central hyperattenuating focus which remains stable on precontrast images from PET-CT as well as post-contrast venous phase and delayed images. There is surrounding mixed attenuation hemorrhage. This capsular hemorrhage partially protrudes into the diaphragmatic hiatus  (axial series 12, image 7). Extracapsular hemorrhage is present as well, partially decompressing into the lesser sac along the greater curvature of the stomach. Additional hemoperitoneum is present in the right perihepatic space, both pericolic gutters and layering within the pelvis. Adrenals/Urinary Tract: No adrenal hemorrhage or renal injury identified. Bladder is unremarkable. Stomach/Bowel: Moderate hiatal hernia. Fluid collection along the greater curvature of the Lymphatic: No enlarged abdominopelvic lymph nodes. Reproductive: Bilateral thick-walled cystic lesions within both enlarged ovaries compatible with patient's known ovarian neoplasms. Anteverted uterus. Other: Hemoperitoneum, as detailed splenic section above. No free gas. Extensive omental caking and thick walled enhancement of the anterior peritoneal surfaces. Musculoskeletal: Diffuse body wall edema. No acute osseous abnormality or suspicious osseous lesion. Postsurgical changes from PleurX catheter placement with stranding in the left anterior flank. Catheter enters at the anterior left sixth interspace. Review of the MIP images confirms the above findings. IMPRESSION: 3.9 cm splenic laceration with central hyperattenuation concerning for contained vascular injury with capsular hematoma extending over greater than 80% of the splenic surface and with intraperitoneal extension of hemorrhage. Findings are compatible with a AAST grade V splenic injury. Urgent surgical consultation is warranted. PleurX catheter in the left 6th interspace, anterior axillary line and terminates along the mediastinal border directed apically. Redemonstration of the complex thick-walled cystic lesions in both adnexa, compatible with known ovarian neoplasm. Extensive omental caking is present as well as thickening along the anterior liver. Rind like thickening of the pleural surfaces within the left hemithorax and associated volume loss with trace effusion. Could reflect  postprocedural changes from prior pleurodesis though and malignancy is not excluded. These results were called by telephone at the time of interpretation on 07/31/2018 at 6:15 pm to Dr. Francine Graven, who verbally acknowledged these results. Electronically Signed   By: Lovena Le M.D.   On: 07/31/2018 18:15   Ir Thoracentesis Asp Pleural Space W/img Guide  Result Date: 07/07/2018 INDICATION: Patient with history of migraines, tobacco abuse and new onset dyspnea with large left pleural effusion of uncertain etiology. Request for diagnostic and therapeutic left side thoracentesis today in IR. EXAM: ULTRASOUND GUIDED LEFT THORACENTESIS MEDICATIONS: 13 mL 1% lidocaine COMPLICATIONS: None immediate. PROCEDURE: An ultrasound guided thoracentesis was thoroughly discussed with the patient and questions answered. The benefits, risks, alternatives and complications were also discussed. The patient understands and wishes to proceed with the procedure. Written consent was obtained. Ultrasound was performed to localize and mark an adequate pocket of fluid in the left chest. The area was then prepped and draped in the normal sterile fashion. 1% Lidocaine was used for local anesthesia. Under ultrasound guidance a 6 Fr Safe-T-Centesis catheter was introduced. Thoracentesis was performed. The catheter was removed and a dressing applied. FINDINGS: A total of approximately 2.0 L of golden yellow, blood tinged fluid was removed. Samples were sent to the laboratory as requested by the clinical team. IMPRESSION: Successful ultrasound guided left thoracentesis yielding 2.0 L of pleural fluid. Read by Candiss Norse, PA-C Electronically Signed   By: Sandi Mariscal M.D.   On: 07/07/2018 10:36   Ir Perc Pleural Drain W/indwell Cath W/img Guide  Result Date: 07/08/2018 CLINICAL DATA:  Large left pleural effusion EXAM: LEFT CHEST  TUBE PLACEMENT UNDER ULTRASOUND AND FLUOROSCOPIC GUIDANCE ANESTHESIA/SEDATION: Intravenous Fentanyl  153mg and Versed 468mwere administered as conscious sedation during continuous monitoring of the patient's level of consciousness and physiological / cardiorespiratory status by the radiology RN, with a total moderate sedation time of 49 minutes. PROCEDURE: The procedure, risks, benefits, and alternatives were explained to the patient. Questions regarding the procedure were encouraged and answered. The patient understands and consents to the procedure. appropriate skin entry site was determined with ultrasound and marked. The operative field was prepped with chlorhexidinein a sterile fashion, and a sterile drape was applied covering the operative field. A sterile gown and sterile gloves were used for the procedure. Local anesthesia was provided with 1% Lidocaine. Under ultrasound guidance, a 19 gauge percutaneous entry needle advanced into the left pleural space from a lateral approach. Blood-tinged fluid could be aspirated. Amplatz guidewire advanced towards the apex under fluoroscopy. Tract dilated to facilitate placement of 14 French pigtail catheter, directed towards the apex. Catheter secured externally with 0 Prolene suture and placed to Pleur-evac. 500 mL returned spontaneously into the canister without suction. Site was covered with a sterile dressing the patient returned to her room in good condition. COMPLICATIONS: None immediate FINDINGS: Large left pleural effusion was noted. 14 French pigtail catheter placed as above. IMPRESSION: 1. Technically successful placement of left 14 French pigtail chest drain, placed to Pleur-evac water-seal. Electronically Signed   By: D Lucrezia Europe.D.   On: 07/08/2018 16:42    Labs:  CBC: Recent Labs    07/19/18 0824 07/20/18 0529 07/21/18 0434 07/31/18 1500  WBC 18.7* 16.3* 14.1* 7.2  HGB 9.9* 10.3* 10.4* 12.0  HCT 29.8* 32.2* 32.3* 38.5  PLT 357 346 312 699*    COAGS: Recent Labs    07/07/18 0010 07/12/18 1219 07/13/18 0441 07/18/18 1425  07/31/18 1500  INR 1.0 1.1 1.0 1.1 1.1  APTT 30 28  --   --   --     BMP: Recent Labs    07/19/18 0824 07/20/18 0529 07/21/18 0434 07/31/18 1500  NA 134* 134* 135 136  K 3.9 4.0 3.6 3.8  CL 97* 95* 99 101  CO2 _0 GLUCOSE 88 91 116* 95  BUN _1 CALCIUM 8.1* 8.5* 8.3* 9.0  CREATININE 0.67 0.65 0.65 0.63  GFRNONAA >60 >60 >60 >60  GFRAA >60 >60 >60 >60    LIVER FUNCTION TESTS: Recent Labs    07/12/18 1219 07/15/18 0424 07/18/18 1425 07/31/18 1500  BILITOT 0.6 0.5 0.7 0.9  AST 57* 19 32 25  ALT 141* 53* 48* 27  ALKPHOS 322* 203* 242* 138*  PROT 5.8* 5.5* 6.0* 7.1  ALBUMIN 2.2* 2.2* 2.6* 3.2*    TUMOR MARKERS: No results for input(s): AFPTM, CEA, CA199, CHROMGRNA in the last 8760 hours.  Assessment and Plan:  My impression is that this [age]emale with met ovarian carcinoma has a large splenic hematoma currently without active extravasation, of uncertain etiology. Discussed with gen surg and patient the various treatment pathways including  --Watchful waiting with emergent angio/embo if rebleed, with potential for life threatening blood loss --surgical splenectomy with attendant risks of major surgical procedure --urgent splenic embo to pre-empt additional bleeding, with risk of post embo syndrome or infection of infarcted spleen We have elected to proceed with splenic embo urgently. Procedures, technique, possible risks/complications including but not limited to bleeding, infection, organ damage, death discussed with patient who consents to proceed.  Thank you for  this interesting consult.  I greatly enjoyed meeting KERRINGTON SOVA and look forward to participating in their care.  A copy of this report was sent to the requesting provider on this date.  Electronically Signed: Rickard Rhymes, MD 08/01/2018, 1:14 AM   I spent a total of 40 Minutes    in face to face in clinical consultation, greater than 50% of which was counseling/coordinating  care for splenic laceration/hemorrhage.

## 2018-08-01 NOTE — Progress Notes (Signed)
PROGRESS NOTE    JANAYAH ZAVADA  BJS:[id number] DOB: Oct 30, 1970 DOA: 07/31/2018 PCP: [name], MD    Brief Narrative:   48 year old female who presented with a splenic laceration.  She does have significant past medical history for anxiety, depression and insomnia.  Likely advanced stage ovarian cancer including peritoneal carcinomatosis, and possible malignant effusion.   Patient had recent hospitalizations for pleural effusion, symptomatic anemia with peptic ulcer disease requiring PRBC transfusions.  She underwent outpatient work-up with a PET scan for bilateral ovarian masses, she was found to have a splenic laceration.  On her initial physical examination blood pressure 118/58, pulse rate 86, temperature 97.4, respirate 22, her lungs are clear to auscultation bilaterally, heart S1-S2 present and rhythmic, the abdomen was soft nontender, no lower extremity edema.  Sodium 136, potassium 3.8, chloride 101, bicarb 25, glucose 95, BUN 9, creatinine 0.63, white count 7.2, hemoglobin 12.0, hematocrit 38.5, platelets 699.  SARS COVID-19 negative.  Her chest radiograph had a moderate left pleural effusion, pleural catheter in place.    CT chest and abdomen with 3.9 cm splenic laceration with central hypoattenuation concerning for continued vascular injury with capsular hematoma extending over greater than 80% of the splenic surface with intraperitoneal extension of hemorrhage consistent with grade 5 splenic injury.   Patient was admitted to the hospital working diagnosis of splenic injury grade 5, with large splenic hematoma.  Assessment & Plan:   Principal Problem:   Splenic laceration Active Problems:   Tobacco abuse   Ovarian cancer, bilateral ([place])   1. Splenic injury grade 5 and large splenic hematoma.  Patient is sp spleen coil embolization, her Hgb and Hct has remained stable at 11.6 and 36.7. Will continue close monitoring cell count, follow surgery and IR recommendations.    2. Left pleural effusion. Sp chest tube. No further drainage, patient has scheduled appointment next week with CT surgery. To consider inpatient consultation.   3. Constipation. Will add bid miralax and rectal hydrocortisone for hemorrhoids.   4. Anxiety. Continue as needed diazepam.  5. Suspected ovarian cancer. Will need to continue outpatient workup.      DVT prophylaxis: scd  Code Status:  full Family Communication: no family at the bedside  Disposition Plan/ discharge barriers: pending clinical improvement.   Patient hemodynamic stable, will transfer to medical telemetry   Body mass index is 25.8 kg/m. Malnutrition Type:      Malnutrition Characteristics:      Nutrition Interventions:     RN Pressure Injury Documentation:     Consultants:     Procedures:   Spleen, coil embolization   Antimicrobials:       Subjective: Patient is having left lower abdominal pain, moderate in intensity, associated with constipation, no nausea or vomiting, no radiation, no improving factors. Her chest tube has been not draining.   Objective: Vitals:   08/01/18 0430 08/01/18 0500 08/01/18 0515 08/01/18 0800  BP: 103/63 (!) 103/59    Pulse: 81 79  94  Resp: 15 15    Temp:   98.7 F (37.1 C) 98.7 F (37.1 C)  TempSrc:   Oral Oral  SpO2: 93% 92%  94%  Weight:      Height:        Intake/Output Summary (Last 24 hours) at 08/01/2018 1120 Last data filed at 08/01/2018 0645 Gross per 24 hour  Intake 2143.85 ml  Output 300 ml  Net 1843.85 ml   Filed Weights   07/31/18 1429 07/31/18 2250 08/01/18 0353  Weight: 72.6 kg 72.5 kg 72.5 kg    Examination:   General: deconditioned  Neurology: Awake and alert, non focal  E ENT: no pallor, no icterus, oral mucosa moist Cardiovascular: No JVD. S1-S2 present, rhythmic, no gallops, rubs, or murmurs. No lower extremity edema. Pulmonary: positive breath sounds bilaterally, adequate air movement, no wheezing, rhonchi or  rales. Left chest tube in place.  Gastrointestinal. Abdomen with no organomegaly, non tender, no rebound or guarding. Mild distention. Mild tender to deep palpation at the lower quadrants. Skin. No rashes Musculoskeletal: no joint deformities     Data Reviewed: I have personally reviewed following labs and imaging studies  CBC: Recent Labs  Lab 07/31/18 1500  WBC 7.2  NEUTROABS 4.7  HGB 12.0  HCT 38.5  MCV 95.3  PLT 203*   Basic Metabolic Panel: Recent Labs  Lab 07/31/18 1500  NA 136  K 3.8  CL 101  CO2 25  GLUCOSE 95  BUN 9  CREATININE 0.63  CALCIUM 9.0   GFR: Estimated Creatinine Clearance: 87.7 mL/min (by C-G formula based on SCr of 0.63 mg/dL). Liver Function Tests: Recent Labs  Lab 07/31/18 1500  AST 25  ALT 27  ALKPHOS 138*  BILITOT 0.9  PROT 7.1  ALBUMIN 3.2*   No results for input(s): LIPASE, AMYLASE in the last 168 hours. No results for input(s): AMMONIA in the last 168 hours. Coagulation Profile: Recent Labs  Lab 07/31/18 1500  INR 1.1   Cardiac Enzymes: No results for input(s): CKTOTAL, CKMB, CKMBINDEX, TROPONINI in the last 168 hours. BNP (last 3 results) No results for input(s): PROBNP in the last 8760 hours. HbA1C: No results for input(s): HGBA1C in the last 72 hours. CBG: Recent Labs  Lab 07/31/18 0849  GLUCAP 102*   Lipid Profile: No results for input(s): CHOL, HDL, LDLCALC, TRIG, CHOLHDL, LDLDIRECT in the last 72 hours. Thyroid Function Tests: No results for input(s): TSH, T4TOTAL, FREET4, T3FREE, THYROIDAB in the last 72 hours. Anemia Panel: No results for input(s): VITAMINB12, FOLATE, FERRITIN, TIBC, IRON, RETICCTPCT in the last 72 hours.    Radiology Studies: I have reviewed all of the imaging during this hospital visit personally     Scheduled Meds: . docusate sodium  100 mg Oral BID  . lidocaine      . pantoprazole  40 mg Oral BID AC  . senna-docusate  1 tablet Oral QHS   Continuous Infusions: . sodium  chloride 20 mL/hr at 07/31/18 2048  . sodium chloride       LOS: 1 day        Laquanna Veazey Gerome Apley, MD

## 2018-08-01 NOTE — Progress Notes (Addendum)
Received report from IR on patient in room [facility]. Pt has puncture in R groin that is level 0. R femoral sheath pulled at 0218. Pt will need 4 hours bedrest post procedure. Splenic embolization was performed and pt VSS. Will continue to monitor. Lajoyce Corners, RN

## 2018-08-01 NOTE — Progress Notes (Signed)
Just received call from Radiology that they are coming to get pt in [facility] for splenic embolization procedure. Pt has been NPO since midnight but was on clear liquids before then. Pt A&O and able to sign a consent form. Will continue to monitor. Lajoyce Corners, RN

## 2018-08-01 NOTE — Consult Note (Signed)
CC: Consult by Dr. Maudie Mercury for large splenic lac/hematoma  HPI: Michelle Holt is an 48 y.o. female with hx of HLD and newly diagnosed metastatic ovarian cancer - she is currently in the workup phase of her diagnosis. She was admitted to [facility] 6/22 with workup for dyspnea and was found to have large left sided pleural effusion with compressive atelectasis and mediastinal shift and underwent IR thoracentesis and pigtail cathter placement 07/08/2018 and then subsequently VATS on 07/13/2018 with PleurX catheter placed, pleurodesis - noted to have pleural surfaces coated in nodularity and loculated effusion; path on pleural peel did return benign. Since her procedures she notes LUQ pain. She was admitted 07/18/2018 with significant anemia - hgb 4 and sxs related to this. She improved with transfusion. She underwent eval with GI - EGD as she also had ?bloody stool - found to have tiny gastric ulcer in prepyloric area.   CT A/P 6.26 showed: 1. bilateral ovaries are enlarged by heterogeneous appearing cystic lesions, measuring 5.3 x 4.2 cm on the right (series 4, image 72) and 4.5 x 3.2 cm on the left (series 4, image 75). Consider dedicated pelvic ultrasound and/or pelvic MRI to further evaluate for solid components given high suspicion for GYN primary malignancy.  2. No other evidence of mass and no lymphadenopathy in the abdomen or pelvis.  3. Trace ascites. There is some suggestion of omental and peritoneal nodularity (e.g. Series 4, image 43), concerning for peritoneal metastatic disease.  4. Loculated left-sided pleural effusion with left-sided pleural drainage catheter in position. Small right pleural effusion.  Underwent diagnostic CT PET for staging of ovarian CA yesterday: 1. Hypermetabolic tissue associated with cystic masses of the LEFT RIGHT ovary consistent with ovarian carcinoma. 2. Evidence of carcinoma metastasis with rind of hypermetabolic tissue along the inferior margin of liver  RIGHT hepatic lobe as well as on along the ventral LEFT peritoneal space and greater omentum. 3. Hypermetabolic tissue in the LEFT pleural space is related to talc pleurodesis. Cannot exclude underlying malignancy. 4. Incidental finding of a large hematoma in LEFT upper quadrant bound superiorly by the LEFT hemidiaphragm and inferiorly by the spleen. Recommend CTA of the abdomen to evaluate for persistent hemorrhage from the spleen and surgical consultation.  Then underwent CTA C/A/P today: 3.9 cm splenic laceration with central hyperattenuation concerning for contained vascular injury with capsular hematoma extending over greater than 80% of the splenic surface and with intraperitoneal extension of hemorrhage. Findings are compatible with a AAST grade V splenic injury. Urgent surgical consultation is warranted.  PleurX catheter in the left 6th interspace, anterior axillary line and terminates along the mediastinal border directed apically.  Redemonstration of the complex thick-walled cystic lesions in both adnexa, compatible with known ovarian neoplasm. Extensive omental caking is present as well as thickening along the anterior liver.  Rind like thickening of the pleural surfaces within the left hemithorax and associated volume loss with trace effusion. Could reflect postprocedural changes from prior pleurodesis though and malignancy is not excluded.   She was sent to [facility] ED whom requested transfer to [facility]  She denies any changes in her abdominal pain in the last week. Reports stable discomfort since her last hospitalization. Denies weakness/fatigue currently. Denies n/v.   Past Medical History:  Diagnosis Date   Anxiety and depression    Arthritis of facet joints at multiple vertebral levels    L5-S1   Constipation    Dyslipidemia    Family history of breast cancer  Family history of uterine cancer    Insomnia    Irritable bowel syndrome    Migraine      Muscle tension headache    Ovarian carcinoma (HCC)    Plantar fasciitis of right foot     Past Surgical History:  Procedure Laterality Date   CHOLECYSTECTOMY  2008   COLONOSCOPY N/A 08/13/2013   Procedure: COLONOSCOPY;  Surgeon: Rogene Houston, MD;  [facility];  Service: Endoscopy;  Laterality: N/A;  230-moved to 145 Ann to notify pt   ESOPHAGOGASTRODUODENOSCOPY     ESOPHAGOGASTRODUODENOSCOPY (EGD) WITH PROPOFOL N/A 07/20/2018   Procedure: ESOPHAGOGASTRODUODENOSCOPY (EGD) WITH PROPOFOL;  Surgeon: Rogene Houston, MD;  [facility];  Service: Endoscopy;  Laterality: N/A;  Possible esophageal dilation.   IR PERC PLEURAL DRAIN W/INDWELL CATH W/IMG GUIDE  07/08/2018   IR THORACENTESIS ASP PLEURAL SPACE W/IMG GUIDE  07/07/2018   PLEURAL EFFUSION DRAINAGE Left 07/13/2018   Procedure: DRAINAGE OF LOCULATED PLEURAL EFFUSION;  Surgeon: Ivin Poot, MD;  Location: [place];  Service: Thoracic;  Laterality: Left;   TALC PLEURODESIS Left 07/13/2018   Procedure: Talc Pleuradesis;  Surgeon: Prescott Gum, Collier Salina, MD;  Location: [facility] OR;  Service: Thoracic;  Laterality: Left;   TUBAL LIGATION Bilateral    UTERINE ABLATION     VIDEO ASSISTED THORACOSCOPY Left 07/13/2018   Procedure: VIDEO ASSISTED THORACOSCOPY;  Surgeon: Ivin Poot, MD;  Location: [facility] OR;  Service: Thoracic;  Laterality: Left;    Family History  Problem Relation Age of Onset   Hypertension Mother    Obesity Mother    Diabetes Mother    Kidney disease Mother    Peripheral vascular disease Father    Atrial fibrillation Father    COPD Brother    Osteoporosis Brother    Crohn's disease Sister    Uterine cancer Sister 17       maternal half sister   Breast cancer Maternal Aunt 3   Colon cancer Neg Hx     Social:  reports that she quit smoking about 5 weeks ago. Her smoking use included cigarettes. She has a 8.50 pack-year smoking history. She has never used smokeless tobacco. She  reports current alcohol use of about 1.0 standard drinks of alcohol per week. She reports that she does not use drugs.  Allergies:  Allergies  Allergen Reactions   Morphine And Related Itching   Nortriptyline Other (See Comments)    Significant weight gain   Topamax [Topiramate] Diarrhea    nausea   Actifed Cold-Allergy [Chlorpheniramine-Phenyleph Er] Rash   Amoxicillin Rash    Did it involve swelling of the face/tongue/throat, SOB, or low BP? Unknown Did it involve sudden or severe rash/hives, skin peeling, or any reaction on the inside of your mouth or nose? Unknown Did you need to seek medical attention at a hospital or doctor's office? Unknown When did it last happen? teenager If all above answers are "NO", may proceed with cephalosporin use.    Codeine Hives   Erythromycin Rash   Penicillins Rash    Did it involve swelling of the face/tongue/throat, SOB, or low BP? Unknown Did it involve sudden or severe rash/hives, skin peeling, or any reaction on the inside of your mouth or nose? Unknown Did you need to seek medical attention at a hospital or doctor's office? Unknown When did it last happen? teenager If all above answers are "NO", may proceed with cephalosporin use.    Sudafed [Pseudoephedrine Hcl] Rash  Medications: I have reviewed the patient's current medications.  Results for orders placed or performed during the hospital encounter of 07/31/18 (from the past 48 hour(s))  Type and screen     Status: None   Collection Time: 07/31/18  3:00 PM  Result Value Ref Range   ABO/RH(D) O POS    Antibody Screen NEG    Sample Expiration      08/03/2018,2359 Performed at [facility], [address] [place], [place] [address]   Protime-INR     Status: None   Collection Time: 07/31/18  3:00 PM  Result Value Ref Range   Prothrombin Time 13.7 11.4 - 15.2 seconds   INR 1.1 0.8 - 1.2    Comment: (NOTE) INR goal varies based on device and disease  states. Performed at [facility], [address], [place], [place] [address]   CBC with Differential/Platelet     Status: Abnormal   Collection Time: 07/31/18  3:00 PM  Result Value Ref Range   WBC 7.2 4.0 - 10.5 K/uL   RBC 4.04 3.87 - 5.11 MIL/uL   Hemoglobin 12.0 12.0 - 15.0 g/dL   HCT 38.5 36.0 - 46.0 %   MCV 95.3 80.0 - 100.0 fL   MCH 29.7 26.0 - 34.0 pg   MCHC 31.2 30.0 - 36.0 g/dL   RDW 14.4 11.5 - 15.5 %   Platelets 699 (H) 150 - 400 K/uL   nRBC 0.0 0.0 - 0.2 %   Neutrophils Relative % 65 %   Neutro Abs 4.7 1.7 - 7.7 K/uL   Lymphocytes Relative 18 %   Lymphs Abs 1.3 0.7 - 4.0 K/uL   Monocytes Relative 10 %   Monocytes Absolute 0.7 0.1 - 1.0 K/uL   Eosinophils Relative 5 %   Eosinophils Absolute 0.4 0.0 - 0.5 K/uL   Basophils Relative 1 %   Basophils Absolute 0.1 0.0 - 0.1 K/uL   Immature Granulocytes 1 %   Abs Immature Granulocytes 0.05 0.00 - 0.07 K/uL    Comment: Performed at [facility], [address] [place], [place] [address]  Comprehensive metabolic panel     Status: Abnormal   Collection Time: 07/31/18  3:00 PM  Result Value Ref Range   Sodium 136 135 - 145 mmol/L   Potassium 3.8 3.5 - 5.1 mmol/L   Chloride 101 98 - 111 mmol/L   CO2 25 22 - 32 mmol/L   Glucose, Bld 95 70 - 99 mg/dL   BUN 9 6 - 20 mg/dL   Creatinine, Ser 0.63 0.44 - 1.00 mg/dL   Calcium 9.0 8.9 - 10.3 mg/dL   Total Protein 7.1 6.5 - 8.1 g/dL   Albumin 3.2 (L) 3.5 - 5.0 g/dL   AST 25 15 - 41 U/L   ALT 27 0 - 44 U/L   Alkaline Phosphatase 138 (H) 38 - 126 U/L   Total Bilirubin 0.9 0.3 - 1.2 mg/dL   GFR calc non Af Amer >60 >60 mL/min   GFR calc Af Amer >60 >60 mL/min   Anion gap 10 5 - 15    Comment: Performed at [facility], [address], [place], [place] [address]  SARS Coronavirus 2 (CEPHEID - Performed in [facility] hospital lab), Hosp Order     Status: None   Collection Time: 07/31/18  6:45 PM   Specimen: Nasopharyngeal Swab  Result Value Ref Range   SARS Coronavirus 2  NEGATIVE NEGATIVE    Comment: (NOTE) If result is NEGATIVE SARS-CoV-2 target nucleic  acids are NOT DETECTED. The SARS-CoV-2 RNA is generally detectable in upper and lower  respiratory specimens during the acute phase of infection. The lowest  concentration of SARS-CoV-2 viral copies this assay can detect is 250  copies / mL. A negative result does not preclude SARS-CoV-2 infection  and should not be used as the sole basis for treatment or other  patient management decisions.  A negative result may occur with  improper specimen collection / handling, submission of specimen other  than nasopharyngeal swab, presence of viral mutation(s) within the  areas targeted by this assay, and inadequate number of viral copies  (<250 copies / mL). A negative result must be combined with clinical  observations, patient history, and epidemiological information. If result is POSITIVE SARS-CoV-2 target nucleic acids are DETECTED. The SARS-CoV-2 RNA is generally detectable in upper and lower  respiratory specimens dur ing the acute phase of infection.  Positive  results are indicative of active infection with SARS-CoV-2.  Clinical  correlation with patient history and other diagnostic information is  necessary to determine patient infection status.  Positive results do  not rule out bacterial infection or co-infection with other viruses. If result is PRESUMPTIVE POSTIVE SARS-CoV-2 nucleic acids MAY BE PRESENT.   A presumptive positive result was obtained on the submitted specimen  and confirmed on repeat testing.  While 2019 novel coronavirus  (SARS-CoV-2) nucleic acids may be present in the submitted sample  additional confirmatory testing may be necessary for epidemiological  and / or clinical management purposes  to differentiate between  SARS-CoV-2 and other Sarbecovirus currently known to infect humans.  If clinically indicated additional testing with an alternate test  methodology [phone]) is  advised. The SARS-CoV-2 RNA is generally  detectable in upper and lower respiratory sp ecimens during the acute  phase of infection. The expected result is Negative. Fact Sheet for Patients:  StrictlyIdeas.no Fact Sheet for Healthcare Providers: BankingDealers.co.za This test is not yet approved or cleared by the [place] FDA and has been authorized for detection and/or diagnosis of SARS-CoV-2 by FDA under an Emergency Use Authorization (EUA).  This EUA will remain in effect (meaning this test can be used) for the duration of the COVID-19 declaration under Section 564(b)(1) of the Act, 21 U.S.C. section 360bbb-3(b)(1), unless the authorization is terminated or revoked sooner. Performed at [facility], [address], [place], [place] [address]     Ct Angio Chest Pe W And/or Wo Contrast  Result Date: 07/31/2018 CLINICAL DATA:  Operative complication of a Pleurx catheter placement. EXAM: CT ANGIOGRAPHY CHEST, ABDOMEN AND PELVIS TECHNIQUE: Multidetector CT imaging through the chest, abdomen and pelvis was performed using the standard protocol during bolus administration of intravenous contrast. Multiplanar reconstructed images and MIPs were obtained and reviewed to evaluate the vascular anatomy. CONTRAST:  188mL OMNIPAQUE IOHEXOL 350 MG/ML SOLN COMPARISON:  Same day PET-CT FINDINGS: CTA CHEST FINDINGS Cardiovascular: Preferential opacification of the thoracic aorta. Evaluation of the aortic root limited due to cardiac pulsation artifact. No convincing evidence of thoracic aortic aneurysm or dissection. Normal heart size. No pericardial effusion. Mediastinum/Nodes: Several reactive but not pathologically enlarged mediastinal and left hilar nodes are present, likely reactive to the adjacent inflammatory features given absence of uptake on PET-CT. Similarly a subcentimeter hypoattenuating nodule in the left lobe thyroid without uptake on comparison  PET images is likely benign. There is nonspecific thickening of the mid esophagus and a moderate hiatal hernia. The airways are diffusely thickened with scattered secretions. There is extension  of a large perisplenic hematoma through the diaphragmatic hiatus and into the lower posterior mediastinum. Lungs/Pleura: Hypoventilatory changes are present along with dependent atelectasis posteriorly. Nodular, rind-like pleural thickening throughout the left hemithorax and within the fissures is similar to PET-CT. No suspicious nodules. A small, loculated left pleural effusion is again seen. Left Pleurx catheter in place terminating along the left mediastinal border directed apically. Musculoskeletal: No chest wall mass or suspicious bone lesions identified. Review of the MIP images confirms the above findings. CTA ABDOMEN AND PELVIS FINDINGS VASCULAR Aorta: The aorta is normal caliber. No intramural hematoma, dissection flap or other luminal abnormality of the aorta is seen. No periaortic stranding or hemorrhage. Minimal atheromatous plaque near the aortic bifurcation. No aneurysm or ectasia. Celiac: Patent without evidence of aneurysm, dissection, vasculitis or significant stenosis. SMA: Patent without evidence of aneurysm, dissection, vasculitis or significant stenosis. Renals: Both renal arteries are patent without evidence of aneurysm, dissection, vasculitis, fibromuscular dysplasia or significant stenosis. IMA: Patent without evidence of aneurysm, dissection, vasculitis or significant stenosis. Inflow: Patent without evidence of aneurysm, dissection, vasculitis or significant stenosis. Veins: No obvious venous abnormality within the limitations of this arterial phase study. Review of the MIP images confirms the above findings. NON-VASCULAR Hepatobiliary: No focal liver abnormality or hepatic injury is seen. Small amount of perihepatic hemorrhage likely tracking from the site of splenic injury. Patient is post  cholecystectomy. Slight prominence of the biliary tree likely related to reservoir effect. No calcified intraductal gallstones. Pancreas: Unremarkable. No pancreatic ductal dilatation or surrounding inflammatory changes. Spleen: There is a 3.9 cm splenic laceration with a faint central hyperattenuating focus which remains stable on precontrast images from PET-CT as well as post-contrast venous phase and delayed images. There is surrounding mixed attenuation hemorrhage. This capsular hemorrhage partially protrudes into the diaphragmatic hiatus (axial series 12, image 7). Extracapsular hemorrhage is present as well, partially decompressing into the lesser sac along the greater curvature of the stomach. Additional hemoperitoneum is present in the right perihepatic space, both pericolic gutters and layering within the pelvis. Adrenals/Urinary Tract: No adrenal hemorrhage or renal injury identified. Bladder is unremarkable. Stomach/Bowel: Moderate hiatal hernia. Fluid collection along the greater curvature of the Lymphatic: No enlarged abdominopelvic lymph nodes. Reproductive: Bilateral thick-walled cystic lesions within both enlarged ovaries compatible with patient's known ovarian neoplasms. Anteverted uterus. Other: Hemoperitoneum, as detailed splenic section above. No free gas. Extensive omental caking and thick walled enhancement of the anterior peritoneal surfaces. Musculoskeletal: Diffuse body wall edema. No acute osseous abnormality or suspicious osseous lesion. Postsurgical changes from PleurX catheter placement with stranding in the left anterior flank. Catheter enters at the anterior left sixth interspace. Review of the MIP images confirms the above findings. IMPRESSION: 3.9 cm splenic laceration with central hyperattenuation concerning for contained vascular injury with capsular hematoma extending over greater than 80% of the splenic surface and with intraperitoneal extension of hemorrhage. Findings are  compatible with a AAST grade V splenic injury. Urgent surgical consultation is warranted. PleurX catheter in the left 6th interspace, anterior axillary line and terminates along the mediastinal border directed apically. Redemonstration of the complex thick-walled cystic lesions in both adnexa, compatible with known ovarian neoplasm. Extensive omental caking is present as well as thickening along the anterior liver. Rind like thickening of the pleural surfaces within the left hemithorax and associated volume loss with trace effusion. Could reflect postprocedural changes from prior pleurodesis though and malignancy is not excluded. These results were called by telephone at the time of interpretation on 07/31/2018  at 6:15 pm to Dr. Francine Graven, who verbally acknowledged these results. Electronically Signed   By: Lovena Le M.D.   On: 07/31/2018 18:15   Nm Pet Image Initial (pi) Skull Base To Thigh  Result Date: 07/31/2018 CLINICAL DATA:  Initial treatment strategy for ovarian carcinoma. EXAM: NUCLEAR MEDICINE PET SKULL BASE TO THIGH TECHNIQUE: 8.16 mCi F-18 FDG was injected intravenously. Full-ring PET imaging was performed from the skull base to thigh after the radiotracer. CT data was obtained and used for attenuation correction and anatomic localization. Fasting blood glucose: 102 mg/dl COMPARISON:  CT 07/10/2018 FINDINGS: Mediastinal blood pool activity: SUV max 2.94 Liver activity: SUV max 4 NECK: No hypermetabolic lymph nodes in the neck. Incidental CT findings: none CHEST: Rind of hypermetabolic activity along the medial mediastinum pleural surface of the LEFT lung consistent with talc pleurodesis. There is loculated lung at the LEFT lung base with a rim of hypermetabolic activity also related to pleurodesis. No hypermetabolic mediastinal lymph nodes. Mild metabolic activity associated the mid esophagus (image 71) Incidental CT findings: No suspicious pulmonary nodules. PleurX catheter in place.  ABDOMEN/PELVIS: There is hypermetabolic tissue associated with the enlarged ovaries. The hypermetabolic portion corresponds to the thickened soft tissue component along the walls of the cystic ovarian masses. The RIGHT ovarian mass measures 5.8 Cm with SUV max equal 6.7 along the soft tissue wall thickening portion. The cystic portion is non metabolic. The LEFT ovary is smaller measuring 4.3 cm with hypermetabolic tissue medially similar to the RIGHT. There is a rim of hypermetabolic activity along the inferior margin of the RIGHT hepatic lobe which corresponds to subtle peritoneal thickening. This metabolic activity is above background liver activity with SUV max equal 5.0 compared to 3.0 in the background liver. A similar hypermetabolic peritoneal thickening in the LEFT ventral peritoneal surface with SUV max equal 3.4 on image 112. Mild hypermetabolic nodularity in the greater omentum with SUV max equal 3.0 on image 113. Incidental CT findings: There is a large splenic hematoma superior to the spleen confined superiorly by the LEFT hemidiaphragm. This large round hematoma measures 10.5 x 11.3 by 9.0 cm. Within the hematoma is bounded inferiorly by the spleen superiorly by the diaphragm as seen on coronal image 67/126 and PET-CT image 131. SKELETON: No focal hypermetabolic activity to suggest skeletal metastasis. Incidental CT findings: none IMPRESSION: 1. Hypermetabolic tissue associated with cystic masses of the LEFT RIGHT ovary consistent with ovarian carcinoma. 2. Evidence of carcinoma metastasis with rind of hypermetabolic tissue along the inferior margin of liver RIGHT hepatic lobe as well as on along the ventral LEFT peritoneal space and greater omentum. 3. Hypermetabolic tissue in the LEFT pleural space is related to talc pleurodesis. Cannot exclude underlying malignancy. 4. Incidental finding of a large hematoma in LEFT upper quadrant bound superiorly by the LEFT hemidiaphragm and inferiorly by the  spleen. Recommend CTA of the abdomen to evaluate for persistent hemorrhage from the spleen and surgical consultation. Findings conveyed toSREEDHAR Holt on 07/31/2018  at13:20. Electronically Signed   By: Suzy Bouchard M.D.   On: 07/31/2018 13:31   Dg Chest Port 1 View  Result Date: 07/31/2018 CLINICAL DATA:  PleurX, effusion, suspected intra-abdominal hemorrhage on PET-CT EXAM: PORTABLE CHEST 1 VIEW COMPARISON:  Same day PET-CT, chest radiograph, 07/18/2018 FINDINGS: No significant change in chest radiograph with a moderate left pleural effusion and associated atelectasis or consolidation, with a tunneled left-sided chest catheter in position about the posterior pleural space. The right lung is normally aerated. Cardiomegaly.  IMPRESSION: No significant change in chest radiograph with a moderate left pleural effusion and associated atelectasis or consolidation, with a tunneled left-sided chest catheter in position about the posterior pleural space. The right lung is normally aerated. Cardiomegaly. Electronically Signed   By: Eddie Candle M.D.   On: 07/31/2018 15:37   Ct Angio Abd/pel W And/or Wo Contrast  Result Date: 07/31/2018 CLINICAL DATA:  Operative complication of a Pleurx catheter placement. EXAM: CT ANGIOGRAPHY CHEST, ABDOMEN AND PELVIS TECHNIQUE: Multidetector CT imaging through the chest, abdomen and pelvis was performed using the standard protocol during bolus administration of intravenous contrast. Multiplanar reconstructed images and MIPs were obtained and reviewed to evaluate the vascular anatomy. CONTRAST:  135mL OMNIPAQUE IOHEXOL 350 MG/ML SOLN COMPARISON:  Same day PET-CT FINDINGS: CTA CHEST FINDINGS Cardiovascular: Preferential opacification of the thoracic aorta. Evaluation of the aortic root limited due to cardiac pulsation artifact. No convincing evidence of thoracic aortic aneurysm or dissection. Normal heart size. No pericardial effusion. Mediastinum/Nodes: Several reactive but  not pathologically enlarged mediastinal and left hilar nodes are present, likely reactive to the adjacent inflammatory features given absence of uptake on PET-CT. Similarly a subcentimeter hypoattenuating nodule in the left lobe thyroid without uptake on comparison PET images is likely benign. There is nonspecific thickening of the mid esophagus and a moderate hiatal hernia. The airways are diffusely thickened with scattered secretions. There is extension of a large perisplenic hematoma through the diaphragmatic hiatus and into the lower posterior mediastinum. Lungs/Pleura: Hypoventilatory changes are present along with dependent atelectasis posteriorly. Nodular, rind-like pleural thickening throughout the left hemithorax and within the fissures is similar to PET-CT. No suspicious nodules. A small, loculated left pleural effusion is again seen. Left Pleurx catheter in place terminating along the left mediastinal border directed apically. Musculoskeletal: No chest wall mass or suspicious bone lesions identified. Review of the MIP images confirms the above findings. CTA ABDOMEN AND PELVIS FINDINGS VASCULAR Aorta: The aorta is normal caliber. No intramural hematoma, dissection flap or other luminal abnormality of the aorta is seen. No periaortic stranding or hemorrhage. Minimal atheromatous plaque near the aortic bifurcation. No aneurysm or ectasia. Celiac: Patent without evidence of aneurysm, dissection, vasculitis or significant stenosis. SMA: Patent without evidence of aneurysm, dissection, vasculitis or significant stenosis. Renals: Both renal arteries are patent without evidence of aneurysm, dissection, vasculitis, fibromuscular dysplasia or significant stenosis. IMA: Patent without evidence of aneurysm, dissection, vasculitis or significant stenosis. Inflow: Patent without evidence of aneurysm, dissection, vasculitis or significant stenosis. Veins: No obvious venous abnormality within the limitations of this  arterial phase study. Review of the MIP images confirms the above findings. NON-VASCULAR Hepatobiliary: No focal liver abnormality or hepatic injury is seen. Small amount of perihepatic hemorrhage likely tracking from the site of splenic injury. Patient is post cholecystectomy. Slight prominence of the biliary tree likely related to reservoir effect. No calcified intraductal gallstones. Pancreas: Unremarkable. No pancreatic ductal dilatation or surrounding inflammatory changes. Spleen: There is a 3.9 cm splenic laceration with a faint central hyperattenuating focus which remains stable on precontrast images from PET-CT as well as post-contrast venous phase and delayed images. There is surrounding mixed attenuation hemorrhage. This capsular hemorrhage partially protrudes into the diaphragmatic hiatus (axial series 12, image 7). Extracapsular hemorrhage is present as well, partially decompressing into the lesser sac along the greater curvature of the stomach. Additional hemoperitoneum is present in the right perihepatic space, both pericolic gutters and layering within the pelvis. Adrenals/Urinary Tract: No adrenal hemorrhage or renal injury identified. Bladder  is unremarkable. Stomach/Bowel: Moderate hiatal hernia. Fluid collection along the greater curvature of the Lymphatic: No enlarged abdominopelvic lymph nodes. Reproductive: Bilateral thick-walled cystic lesions within both enlarged ovaries compatible with patient's known ovarian neoplasms. Anteverted uterus. Other: Hemoperitoneum, as detailed splenic section above. No free gas. Extensive omental caking and thick walled enhancement of the anterior peritoneal surfaces. Musculoskeletal: Diffuse body wall edema. No acute osseous abnormality or suspicious osseous lesion. Postsurgical changes from PleurX catheter placement with stranding in the left anterior flank. Catheter enters at the anterior left sixth interspace. Review of the MIP images confirms the above  findings. IMPRESSION: 3.9 cm splenic laceration with central hyperattenuation concerning for contained vascular injury with capsular hematoma extending over greater than 80% of the splenic surface and with intraperitoneal extension of hemorrhage. Findings are compatible with a AAST grade V splenic injury. Urgent surgical consultation is warranted. PleurX catheter in the left 6th interspace, anterior axillary line and terminates along the mediastinal border directed apically. Redemonstration of the complex thick-walled cystic lesions in both adnexa, compatible with known ovarian neoplasm. Extensive omental caking is present as well as thickening along the anterior liver. Rind like thickening of the pleural surfaces within the left hemithorax and associated volume loss with trace effusion. Could reflect postprocedural changes from prior pleurodesis though and malignancy is not excluded. These results were called by telephone at the time of interpretation on 07/31/2018 at 6:15 pm to Dr. Francine Graven, who verbally acknowledged these results. Electronically Signed   By: Lovena Le M.D.   On: 07/31/2018 18:15    ROS - all of the below systems have been reviewed with the patient and positives are indicated with bold text General: chills, fever or night sweats Eyes: blurry vision or double vision ENT: epistaxis or sore throat Allergy/Immunology: itchy/watery eyes or nasal congestion Hematologic/Lymphatic: bleeding problems, blood clots or swollen lymph nodes Endocrine: temperature intolerance or unexpected weight changes Breast: new or changing breast lumps or nipple discharge Resp: cough, shortness of breath, or wheezing CV: chest pain or dyspnea on exertion GI: as per HPI GU: dysuria, trouble voiding, or hematuria MSK: joint pain or joint stiffness Neuro: TIA or stroke symptoms Derm: pruritus and skin lesion changes Psych: anxiety and depression  PE Blood pressure (!) 82/69, pulse 89, temperature  98.6 F (37 C), temperature source Oral, resp. rate (!) 23, height 5\' 6"  (1.676 m), weight 72.5 kg, SpO2 95 %. Constitutional: NAD; conversant; wearing surgical mask Eyes: Moist conjunctiva; no lid lag; anicteric; PERRL Neck: Trachea midline; no thyromegaly Lungs: Normal respiratory effort; no tactile fremitus CV: RRR; no palpable thrills; no pitting edema GI: Abd soft, focally ttp in LUQ; nondistended; no rebound/guarding; no right sided tenderness MSK: No deformities; no clubbing/cyanosis Psychiatric: Appropriate affect; alert and oriented x3 Lymphatic: No palpable cervical or axillary lymphadenopathy  Results for orders placed or performed during the hospital encounter of 07/31/18 (from the past 48 hour(s))  Type and screen     Status: None   Collection Time: 07/31/18  3:00 PM  Result Value Ref Range   ABO/RH(D) O POS    Antibody Screen NEG    Sample Expiration      08/03/2018,2359 Performed at [facility], [address], [place], [place] [address]   Protime-INR     Status: None   Collection Time: 07/31/18  3:00 PM  Result Value Ref Range   Prothrombin Time 13.7 11.4 - 15.2 seconds   INR 1.1 0.8 - 1.2    Comment: (NOTE) INR goal varies  based on device and disease states. Performed at [facility], [address], [place], [place] [address]   CBC with Differential/Platelet     Status: Abnormal   Collection Time: 07/31/18  3:00 PM  Result Value Ref Range   WBC 7.2 4.0 - 10.5 K/uL   RBC 4.04 3.87 - 5.11 MIL/uL   Hemoglobin 12.0 12.0 - 15.0 g/dL   HCT 38.5 36.0 - 46.0 %   MCV 95.3 80.0 - 100.0 fL   MCH 29.7 26.0 - 34.0 pg   MCHC 31.2 30.0 - 36.0 g/dL   RDW 14.4 11.5 - 15.5 %   Platelets 699 (H) 150 - 400 K/uL   nRBC 0.0 0.0 - 0.2 %   Neutrophils Relative % 65 %   Neutro Abs 4.7 1.7 - 7.7 K/uL   Lymphocytes Relative 18 %   Lymphs Abs 1.3 0.7 - 4.0 K/uL   Monocytes Relative 10 %   Monocytes Absolute 0.7 0.1 - 1.0 K/uL   Eosinophils Relative 5 %   Eosinophils Absolute  0.4 0.0 - 0.5 K/uL   Basophils Relative 1 %   Basophils Absolute 0.1 0.0 - 0.1 K/uL   Immature Granulocytes 1 %   Abs Immature Granulocytes 0.05 0.00 - 0.07 K/uL    Comment: Performed at [facility], [address], [place], [place] [address]  Comprehensive metabolic panel     Status: Abnormal   Collection Time: 07/31/18  3:00 PM  Result Value Ref Range   Sodium 136 135 - 145 mmol/L   Potassium 3.8 3.5 - 5.1 mmol/L   Chloride 101 98 - 111 mmol/L   CO2 25 22 - 32 mmol/L   Glucose, Bld 95 70 - 99 mg/dL   BUN 9 6 - 20 mg/dL   Creatinine, Ser 0.63 0.44 - 1.00 mg/dL   Calcium 9.0 8.9 - 10.3 mg/dL   Total Protein 7.1 6.5 - 8.1 g/dL   Albumin 3.2 (L) 3.5 - 5.0 g/dL   AST 25 15 - 41 U/L   ALT 27 0 - 44 U/L   Alkaline Phosphatase 138 (H) 38 - 126 U/L   Total Bilirubin 0.9 0.3 - 1.2 mg/dL   GFR calc non Af Amer >60 >60 mL/min   GFR calc Af Amer >60 >60 mL/min   Anion gap 10 5 - 15    Comment: Performed at [facility], [address], [place], [place] [address]  SARS Coronavirus 2 (CEPHEID - Performed in [place] hospital lab), Hosp Order     Status: None   Collection Time: 07/31/18  6:45 PM   Specimen: Nasopharyngeal Swab  Result Value Ref Range   SARS Coronavirus 2 NEGATIVE NEGATIVE    Comment: (NOTE) If result is NEGATIVE SARS-CoV-2 target nucleic acids are NOT DETECTED. The SARS-CoV-2 RNA is generally detectable in upper and lower  respiratory specimens during the acute phase of infection. The lowest  concentration of SARS-CoV-2 viral copies this assay can detect is 250  copies / mL. A negative result does not preclude SARS-CoV-2 infection  and should not be used as the sole basis for treatment or other  patient management decisions.  A negative result may occur with  improper specimen collection / handling, submission of specimen other  than nasopharyngeal swab, presence of viral mutation(s) within the  areas targeted by this assay, and inadequate number of viral copies   (<250 copies / mL). A negative result must be combined with clinical  observations, patient history, and epidemiological information. If result  is POSITIVE SARS-CoV-2 target nucleic acids are DETECTED. The SARS-CoV-2 RNA is generally detectable in upper and lower  respiratory specimens dur ing the acute phase of infection.  Positive  results are indicative of active infection with SARS-CoV-2.  Clinical  correlation with patient history and other diagnostic information is  necessary to determine patient infection status.  Positive results do  not rule out bacterial infection or co-infection with other viruses. If result is PRESUMPTIVE POSTIVE SARS-CoV-2 nucleic acids MAY BE PRESENT.   A presumptive positive result was obtained on the submitted specimen  and confirmed on repeat testing.  While 2019 novel coronavirus  (SARS-CoV-2) nucleic acids may be present in the submitted sample  additional confirmatory testing may be necessary for epidemiological  and / or clinical management purposes  to differentiate between  SARS-CoV-2 and other Sarbecovirus currently known to infect humans.  If clinically indicated additional testing with an alternate test  methodology [phone]) is advised. The SARS-CoV-2 RNA is generally  detectable in upper and lower respiratory sp ecimens during the acute  phase of infection. The expected result is Negative. Fact Sheet for Patients:  StrictlyIdeas.no Fact Sheet for Healthcare Providers: BankingDealers.co.za This test is not yet approved or cleared by the [place] FDA and has been authorized for detection and/or diagnosis of SARS-CoV-2 by FDA under an Emergency Use Authorization (EUA).  This EUA will remain in effect (meaning this test can be used) for the duration of the COVID-19 declaration under Section 564(b)(1) of the Act, 21 U.S.C. section 360bbb-3(b)(1), unless the authorization is terminated  or revoked sooner. Performed at [facility], [address] [place], [place] [address]     Ct Angio Chest Pe W And/or Wo Contrast  Result Date: 07/31/2018 CLINICAL DATA:  Operative complication of a Pleurx catheter placement. EXAM: CT ANGIOGRAPHY CHEST, ABDOMEN AND PELVIS TECHNIQUE: Multidetector CT imaging through the chest, abdomen and pelvis was performed using the standard protocol during bolus administration of intravenous contrast. Multiplanar reconstructed images and MIPs were obtained and reviewed to evaluate the vascular anatomy. CONTRAST:  113mL OMNIPAQUE IOHEXOL 350 MG/ML SOLN COMPARISON:  Same day PET-CT FINDINGS: CTA CHEST FINDINGS Cardiovascular: Preferential opacification of the thoracic aorta. Evaluation of the aortic root limited due to cardiac pulsation artifact. No convincing evidence of thoracic aortic aneurysm or dissection. Normal heart size. No pericardial effusion. Mediastinum/Nodes: Several reactive but not pathologically enlarged mediastinal and left hilar nodes are present, likely reactive to the adjacent inflammatory features given absence of uptake on PET-CT. Similarly a subcentimeter hypoattenuating nodule in the left lobe thyroid without uptake on comparison PET images is likely benign. There is nonspecific thickening of the mid esophagus and a moderate hiatal hernia. The airways are diffusely thickened with scattered secretions. There is extension of a large perisplenic hematoma through the diaphragmatic hiatus and into the lower posterior mediastinum. Lungs/Pleura: Hypoventilatory changes are present along with dependent atelectasis posteriorly. Nodular, rind-like pleural thickening throughout the left hemithorax and within the fissures is similar to PET-CT. No suspicious nodules. A small, loculated left pleural effusion is again seen. Left Pleurx catheter in place terminating along the left mediastinal border directed apically. Musculoskeletal: No chest wall mass or  suspicious bone lesions identified. Review of the MIP images confirms the above findings. CTA ABDOMEN AND PELVIS FINDINGS VASCULAR Aorta: The aorta is normal caliber. No intramural hematoma, dissection flap or other luminal abnormality of the aorta is seen. No periaortic stranding or hemorrhage. Minimal atheromatous plaque near the aortic bifurcation. No aneurysm or ectasia. Celiac:  Patent without evidence of aneurysm, dissection, vasculitis or significant stenosis. SMA: Patent without evidence of aneurysm, dissection, vasculitis or significant stenosis. Renals: Both renal arteries are patent without evidence of aneurysm, dissection, vasculitis, fibromuscular dysplasia or significant stenosis. IMA: Patent without evidence of aneurysm, dissection, vasculitis or significant stenosis. Inflow: Patent without evidence of aneurysm, dissection, vasculitis or significant stenosis. Veins: No obvious venous abnormality within the limitations of this arterial phase study. Review of the MIP images confirms the above findings. NON-VASCULAR Hepatobiliary: No focal liver abnormality or hepatic injury is seen. Small amount of perihepatic hemorrhage likely tracking from the site of splenic injury. Patient is post cholecystectomy. Slight prominence of the biliary tree likely related to reservoir effect. No calcified intraductal gallstones. Pancreas: Unremarkable. No pancreatic ductal dilatation or surrounding inflammatory changes. Spleen: There is a 3.9 cm splenic laceration with a faint central hyperattenuating focus which remains stable on precontrast images from PET-CT as well as post-contrast venous phase and delayed images. There is surrounding mixed attenuation hemorrhage. This capsular hemorrhage partially protrudes into the diaphragmatic hiatus (axial series 12, image 7). Extracapsular hemorrhage is present as well, partially decompressing into the lesser sac along the greater curvature of the stomach. Additional  hemoperitoneum is present in the right perihepatic space, both pericolic gutters and layering within the pelvis. Adrenals/Urinary Tract: No adrenal hemorrhage or renal injury identified. Bladder is unremarkable. Stomach/Bowel: Moderate hiatal hernia. Fluid collection along the greater curvature of the Lymphatic: No enlarged abdominopelvic lymph nodes. Reproductive: Bilateral thick-walled cystic lesions within both enlarged ovaries compatible with patient's known ovarian neoplasms. Anteverted uterus. Other: Hemoperitoneum, as detailed splenic section above. No free gas. Extensive omental caking and thick walled enhancement of the anterior peritoneal surfaces. Musculoskeletal: Diffuse body wall edema. No acute osseous abnormality or suspicious osseous lesion. Postsurgical changes from PleurX catheter placement with stranding in the left anterior flank. Catheter enters at the anterior left sixth interspace. Review of the MIP images confirms the above findings. IMPRESSION: 3.9 cm splenic laceration with central hyperattenuation concerning for contained vascular injury with capsular hematoma extending over greater than 80% of the splenic surface and with intraperitoneal extension of hemorrhage. Findings are compatible with a AAST grade V splenic injury. Urgent surgical consultation is warranted. PleurX catheter in the left 6th interspace, anterior axillary line and terminates along the mediastinal border directed apically. Redemonstration of the complex thick-walled cystic lesions in both adnexa, compatible with known ovarian neoplasm. Extensive omental caking is present as well as thickening along the anterior liver. Rind like thickening of the pleural surfaces within the left hemithorax and associated volume loss with trace effusion. Could reflect postprocedural changes from prior pleurodesis though and malignancy is not excluded. These results were called by telephone at the time of interpretation on 07/31/2018 at 6:15  pm to Dr. Francine Graven, who verbally acknowledged these results. Electronically Signed   By: Lovena Le M.D.   On: 07/31/2018 18:15   Nm Pet Image Initial (pi) Skull Base To Thigh  Result Date: 07/31/2018 CLINICAL DATA:  Initial treatment strategy for ovarian carcinoma. EXAM: NUCLEAR MEDICINE PET SKULL BASE TO THIGH TECHNIQUE: 8.16 mCi F-18 FDG was injected intravenously. Full-ring PET imaging was performed from the skull base to thigh after the radiotracer. CT data was obtained and used for attenuation correction and anatomic localization. Fasting blood glucose: 102 mg/dl COMPARISON:  CT 07/10/2018 FINDINGS: Mediastinal blood pool activity: SUV max 2.94 Liver activity: SUV max 4 NECK: No hypermetabolic lymph nodes in the neck. Incidental CT findings: none CHEST: Ronney Asters  of hypermetabolic activity along the medial mediastinum pleural surface of the LEFT lung consistent with talc pleurodesis. There is loculated lung at the LEFT lung base with a rim of hypermetabolic activity also related to pleurodesis. No hypermetabolic mediastinal lymph nodes. Mild metabolic activity associated the mid esophagus (image 71) Incidental CT findings: No suspicious pulmonary nodules. PleurX catheter in place. ABDOMEN/PELVIS: There is hypermetabolic tissue associated with the enlarged ovaries. The hypermetabolic portion corresponds to the thickened soft tissue component along the walls of the cystic ovarian masses. The RIGHT ovarian mass measures 5.8 Cm with SUV max equal 6.7 along the soft tissue wall thickening portion. The cystic portion is non metabolic. The LEFT ovary is smaller measuring 4.3 cm with hypermetabolic tissue medially similar to the RIGHT. There is a rim of hypermetabolic activity along the inferior margin of the RIGHT hepatic lobe which corresponds to subtle peritoneal thickening. This metabolic activity is above background liver activity with SUV max equal 5.0 compared to 3.0 in the background liver. A similar  hypermetabolic peritoneal thickening in the LEFT ventral peritoneal surface with SUV max equal 3.4 on image 112. Mild hypermetabolic nodularity in the greater omentum with SUV max equal 3.0 on image 113. Incidental CT findings: There is a large splenic hematoma superior to the spleen confined superiorly by the LEFT hemidiaphragm. This large round hematoma measures 10.5 x 11.3 by 9.0 cm. Within the hematoma is bounded inferiorly by the spleen superiorly by the diaphragm as seen on coronal image 67/126 and PET-CT image 131. SKELETON: No focal hypermetabolic activity to suggest skeletal metastasis. Incidental CT findings: none IMPRESSION: 1. Hypermetabolic tissue associated with cystic masses of the LEFT RIGHT ovary consistent with ovarian carcinoma. 2. Evidence of carcinoma metastasis with rind of hypermetabolic tissue along the inferior margin of liver RIGHT hepatic lobe as well as on along the ventral LEFT peritoneal space and greater omentum. 3. Hypermetabolic tissue in the LEFT pleural space is related to talc pleurodesis. Cannot exclude underlying malignancy. 4. Incidental finding of a large hematoma in LEFT upper quadrant bound superiorly by the LEFT hemidiaphragm and inferiorly by the spleen. Recommend CTA of the abdomen to evaluate for persistent hemorrhage from the spleen and surgical consultation. Findings conveyed toSREEDHAR Holt on 07/31/2018  at13:20. Electronically Signed   By: Suzy Bouchard M.D.   On: 07/31/2018 13:31   Dg Chest Port 1 View  Result Date: 07/31/2018 CLINICAL DATA:  PleurX, effusion, suspected intra-abdominal hemorrhage on PET-CT EXAM: PORTABLE CHEST 1 VIEW COMPARISON:  Same day PET-CT, chest radiograph, 07/18/2018 FINDINGS: No significant change in chest radiograph with a moderate left pleural effusion and associated atelectasis or consolidation, with a tunneled left-sided chest catheter in position about the posterior pleural space. The right lung is normally aerated.  Cardiomegaly. IMPRESSION: No significant change in chest radiograph with a moderate left pleural effusion and associated atelectasis or consolidation, with a tunneled left-sided chest catheter in position about the posterior pleural space. The right lung is normally aerated. Cardiomegaly. Electronically Signed   By: Eddie Candle M.D.   On: 07/31/2018 15:37   Ct Angio Abd/pel W And/or Wo Contrast  Result Date: 07/31/2018 CLINICAL DATA:  Operative complication of a Pleurx catheter placement. EXAM: CT ANGIOGRAPHY CHEST, ABDOMEN AND PELVIS TECHNIQUE: Multidetector CT imaging through the chest, abdomen and pelvis was performed using the standard protocol during bolus administration of intravenous contrast. Multiplanar reconstructed images and MIPs were obtained and reviewed to evaluate the vascular anatomy. CONTRAST:  162mL OMNIPAQUE IOHEXOL 350 MG/ML SOLN COMPARISON:  Same day PET-CT FINDINGS: CTA CHEST FINDINGS Cardiovascular: Preferential opacification of the thoracic aorta. Evaluation of the aortic root limited due to cardiac pulsation artifact. No convincing evidence of thoracic aortic aneurysm or dissection. Normal heart size. No pericardial effusion. Mediastinum/Nodes: Several reactive but not pathologically enlarged mediastinal and left hilar nodes are present, likely reactive to the adjacent inflammatory features given absence of uptake on PET-CT. Similarly a subcentimeter hypoattenuating nodule in the left lobe thyroid without uptake on comparison PET images is likely benign. There is nonspecific thickening of the mid esophagus and a moderate hiatal hernia. The airways are diffusely thickened with scattered secretions. There is extension of a large perisplenic hematoma through the diaphragmatic hiatus and into the lower posterior mediastinum. Lungs/Pleura: Hypoventilatory changes are present along with dependent atelectasis posteriorly. Nodular, rind-like pleural thickening throughout the left hemithorax  and within the fissures is similar to PET-CT. No suspicious nodules. A small, loculated left pleural effusion is again seen. Left Pleurx catheter in place terminating along the left mediastinal border directed apically. Musculoskeletal: No chest wall mass or suspicious bone lesions identified. Review of the MIP images confirms the above findings. CTA ABDOMEN AND PELVIS FINDINGS VASCULAR Aorta: The aorta is normal caliber. No intramural hematoma, dissection flap or other luminal abnormality of the aorta is seen. No periaortic stranding or hemorrhage. Minimal atheromatous plaque near the aortic bifurcation. No aneurysm or ectasia. Celiac: Patent without evidence of aneurysm, dissection, vasculitis or significant stenosis. SMA: Patent without evidence of aneurysm, dissection, vasculitis or significant stenosis. Renals: Both renal arteries are patent without evidence of aneurysm, dissection, vasculitis, fibromuscular dysplasia or significant stenosis. IMA: Patent without evidence of aneurysm, dissection, vasculitis or significant stenosis. Inflow: Patent without evidence of aneurysm, dissection, vasculitis or significant stenosis. Veins: No obvious venous abnormality within the limitations of this arterial phase study. Review of the MIP images confirms the above findings. NON-VASCULAR Hepatobiliary: No focal liver abnormality or hepatic injury is seen. Small amount of perihepatic hemorrhage likely tracking from the site of splenic injury. Patient is post cholecystectomy. Slight prominence of the biliary tree likely related to reservoir effect. No calcified intraductal gallstones. Pancreas: Unremarkable. No pancreatic ductal dilatation or surrounding inflammatory changes. Spleen: There is a 3.9 cm splenic laceration with a faint central hyperattenuating focus which remains stable on precontrast images from PET-CT as well as post-contrast venous phase and delayed images. There is surrounding mixed attenuation hemorrhage.  This capsular hemorrhage partially protrudes into the diaphragmatic hiatus (axial series 12, image 7). Extracapsular hemorrhage is present as well, partially decompressing into the lesser sac along the greater curvature of the stomach. Additional hemoperitoneum is present in the right perihepatic space, both pericolic gutters and layering within the pelvis. Adrenals/Urinary Tract: No adrenal hemorrhage or renal injury identified. Bladder is unremarkable. Stomach/Bowel: Moderate hiatal hernia. Fluid collection along the greater curvature of the Lymphatic: No enlarged abdominopelvic lymph nodes. Reproductive: Bilateral thick-walled cystic lesions within both enlarged ovaries compatible with patient's known ovarian neoplasms. Anteverted uterus. Other: Hemoperitoneum, as detailed splenic section above. No free gas. Extensive omental caking and thick walled enhancement of the anterior peritoneal surfaces. Musculoskeletal: Diffuse body wall edema. No acute osseous abnormality or suspicious osseous lesion. Postsurgical changes from PleurX catheter placement with stranding in the left anterior flank. Catheter enters at the anterior left sixth interspace. Review of the MIP images confirms the above findings. IMPRESSION: 3.9 cm splenic laceration with central hyperattenuation concerning for contained vascular injury with capsular hematoma extending over greater than 80% of the splenic surface and  with intraperitoneal extension of hemorrhage. Findings are compatible with a AAST grade V splenic injury. Urgent surgical consultation is warranted. PleurX catheter in the left 6th interspace, anterior axillary line and terminates along the mediastinal border directed apically. Redemonstration of the complex thick-walled cystic lesions in both adnexa, compatible with known ovarian neoplasm. Extensive omental caking is present as well as thickening along the anterior liver. Rind like thickening of the pleural surfaces within the left  hemithorax and associated volume loss with trace effusion. Could reflect postprocedural changes from prior pleurodesis though and malignancy is not excluded. These results were called by telephone at the time of interpretation on 07/31/2018 at 6:15 pm to Dr. Francine Graven, who verbally acknowledged these results. Electronically Signed   By: Lovena Le M.D.   On: 07/31/2018 18:15   A/P: CORIANNE BUCCELLATO is an 48 y.o. 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 Vernard Gambles whom is coming to see -We discussed potential for asplenia following this, need for vaccinations and potential for OPSI although quite uncommon following vaccination.  Sharon Mt. Dema Severin, M.D. [facility]

## 2018-08-01 NOTE — Progress Notes (Signed)
Subjective/Chief Complaint: Complains of abd pain. No vomiting   Objective: Vital signs in last 24 hours: Temp:  [97.4 F (36.3 C)-98.7 F (37.1 C)] 98.7 F (37.1 C) (07/18 0800) Pulse Rate:  [78-100] 94 (07/18 0800) Resp:  [13-26] 15 (07/18 0500) BP: (82-128)/(44-86) 103/59 (07/18 0500) SpO2:  [90 %-100 %] 94 % (07/18 0800) Weight:  [72.5 kg-72.6 kg] 72.5 kg (07/18 0353) Last BM Date: 07/30/18  Intake/Output from previous day: 07/17 0701 - 07/18 0700 In: 2143.9 [P.O.:717; I.V.:1426.9] Out: 300 [Urine:300] Intake/Output this shift: No intake/output data recorded.  General appearance: alert and cooperative Resp: clear to auscultation bilaterally Cardio: regular rate and rhythm GI: soft, moderate left sided tenderness. no peritonitis  Lab Results:  Recent Labs    07/31/18 1500  WBC 7.2  HGB 12.0  HCT 38.5  PLT 699*   BMET Recent Labs    07/31/18 1500  NA 136  K 3.8  CL 101  CO2 25  GLUCOSE 95  BUN 9  CREATININE 0.63  CALCIUM 9.0   PT/INR Recent Labs    07/31/18 1500  LABPROT 13.7  INR 1.1   ABG No results for input(s): PHART, HCO3 in the last 72 hours.  Invalid input(s): PCO2, PO2  Studies/Results: Ct Angio Chest Pe W And/or Wo Contrast  Result Date: 07/31/2018 CLINICAL DATA:  Operative complication of a Pleurx catheter placement. EXAM: CT ANGIOGRAPHY CHEST, ABDOMEN AND PELVIS TECHNIQUE: Multidetector CT imaging through the chest, abdomen and pelvis was performed using the standard protocol during bolus administration of intravenous contrast. Multiplanar reconstructed images and MIPs were obtained and reviewed to evaluate the vascular anatomy. CONTRAST:  174mL OMNIPAQUE IOHEXOL 350 MG/ML SOLN COMPARISON:  Same day PET-CT FINDINGS: CTA CHEST FINDINGS Cardiovascular: Preferential opacification of the thoracic aorta. Evaluation of the aortic root limited due to cardiac pulsation artifact. No convincing evidence of thoracic aortic aneurysm or  dissection. Normal heart size. No pericardial effusion. Mediastinum/Nodes: Several reactive but not pathologically enlarged mediastinal and left hilar nodes are present, likely reactive to the adjacent inflammatory features given absence of uptake on PET-CT. Similarly a subcentimeter hypoattenuating nodule in the left lobe thyroid without uptake on comparison PET images is likely benign. There is nonspecific thickening of the mid esophagus and a moderate hiatal hernia. The airways are diffusely thickened with scattered secretions. There is extension of a large perisplenic hematoma through the diaphragmatic hiatus and into the lower posterior mediastinum. Lungs/Pleura: Hypoventilatory changes are present along with dependent atelectasis posteriorly. Nodular, rind-like pleural thickening throughout the left hemithorax and within the fissures is similar to PET-CT. No suspicious nodules. A small, loculated left pleural effusion is again seen. Left Pleurx catheter in place terminating along the left mediastinal border directed apically. Musculoskeletal: No chest wall mass or suspicious bone lesions identified. Review of the MIP images confirms the above findings. CTA ABDOMEN AND PELVIS FINDINGS VASCULAR Aorta: The aorta is normal caliber. No intramural hematoma, dissection flap or other luminal abnormality of the aorta is seen. No periaortic stranding or hemorrhage. Minimal atheromatous plaque near the aortic bifurcation. No aneurysm or ectasia. Celiac: Patent without evidence of aneurysm, dissection, vasculitis or significant stenosis. SMA: Patent without evidence of aneurysm, dissection, vasculitis or significant stenosis. Renals: Both renal arteries are patent without evidence of aneurysm, dissection, vasculitis, fibromuscular dysplasia or significant stenosis. IMA: Patent without evidence of aneurysm, dissection, vasculitis or significant stenosis. Inflow: Patent without evidence of aneurysm, dissection, vasculitis  or significant stenosis. Veins: No obvious venous abnormality within the limitations of this  arterial phase study. Review of the MIP images confirms the above findings. NON-VASCULAR Hepatobiliary: No focal liver abnormality or hepatic injury is seen. Small amount of perihepatic hemorrhage likely tracking from the site of splenic injury. Patient is post cholecystectomy. Slight prominence of the biliary tree likely related to reservoir effect. No calcified intraductal gallstones. Pancreas: Unremarkable. No pancreatic ductal dilatation or surrounding inflammatory changes. Spleen: There is a 3.9 cm splenic laceration with a faint central hyperattenuating focus which remains stable on precontrast images from PET-CT as well as post-contrast venous phase and delayed images. There is surrounding mixed attenuation hemorrhage. This capsular hemorrhage partially protrudes into the diaphragmatic hiatus (axial series 12, image 7). Extracapsular hemorrhage is present as well, partially decompressing into the lesser sac along the greater curvature of the stomach. Additional hemoperitoneum is present in the right perihepatic space, both pericolic gutters and layering within the pelvis. Adrenals/Urinary Tract: No adrenal hemorrhage or renal injury identified. Bladder is unremarkable. Stomach/Bowel: Moderate hiatal hernia. Fluid collection along the greater curvature of the Lymphatic: No enlarged abdominopelvic lymph nodes. Reproductive: Bilateral thick-walled cystic lesions within both enlarged ovaries compatible with patient's known ovarian neoplasms. Anteverted uterus. Other: Hemoperitoneum, as detailed splenic section above. No free gas. Extensive omental caking and thick walled enhancement of the anterior peritoneal surfaces. Musculoskeletal: Diffuse body wall edema. No acute osseous abnormality or suspicious osseous lesion. Postsurgical changes from PleurX catheter placement with stranding in the left anterior flank. Catheter  enters at the anterior left sixth interspace. Review of the MIP images confirms the above findings. IMPRESSION: 3.9 cm splenic laceration with central hyperattenuation concerning for contained vascular injury with capsular hematoma extending over greater than 80% of the splenic surface and with intraperitoneal extension of hemorrhage. Findings are compatible with a AAST grade V splenic injury. Urgent surgical consultation is warranted. PleurX catheter in the left 6th interspace, anterior axillary line and terminates along the mediastinal border directed apically. Redemonstration of the complex thick-walled cystic lesions in both adnexa, compatible with known ovarian neoplasm. Extensive omental caking is present as well as thickening along the anterior liver. Rind like thickening of the pleural surfaces within the left hemithorax and associated volume loss with trace effusion. Could reflect postprocedural changes from prior pleurodesis though and malignancy is not excluded. These results were called by telephone at the time of interpretation on 07/31/2018 at 6:15 pm to Dr. Francine Graven, who verbally acknowledged these results. Electronically Signed   By: Lovena Le M.D.   On: 07/31/2018 18:15   Ir Angiogram Visceral Selective  Result Date: 08/01/2018 INDICATION: Splenic rupture with large subcapsular hematoma. Prophylactic embolization requested. EXAM: ULTRASOUND GUIDANCE FOR VASCULAR ACCESS SELECTIVE VISCERAL ARTERIOGRAM COIL EMBOLIZATION SPLENIC ARTERY MEDICATIONS: none indicated ANESTHESIA/SEDATION: Intravenous Fentanyl 123mcg and Versed 3mg  were administered as conscious sedation during continuous monitoring of the patient's level of consciousness and physiological / cardiorespiratory status by the radiology RN, with a total moderate sedation time of 29 minutes. CONTRAST:  40 mL Omnipaque 300 IA PROCEDURE: Informed consent was obtained from the patient following explanation of the procedure, risks,  benefits and alternatives. The patient understands, agrees and consents for the procedure. All questions were addressed. A time out was performed prior to the initiation of the procedure. Maximal barrier sterile technique utilized including caps, mask, sterile gowns, sterile gloves, large sterile drape, hand hygiene, and chlorhexidine prep. Patency of right common femoral artery was confirmed with ultrasound and documentation stored. Skin entry site infiltrated with 1% lidocaine. Under real-time ultrasound guidance, the vessel was accessed  with a 21-gauge micropuncture needle, exchanged over a 018 guidewire for a transitional dilator, through which a 035 guidewire was advanced. Over this, a 5 [place] vascular sheath placed. Through this, a 5 French C2 catheter advanced in the celiac axis selected. Celiac arteriography was performed. Tortuous widely patent splenic artery identified. Using an angled Glidewire, the catheter was advanced into the mid splenic artery. Confirmatory selective arteriography was performed. There is distortion of splenic intraparenchymal branches secondary to large subcapsular hematoma. No active extravasation. Through the angiographic catheter, a Renegade microcatheter was advanced into the distal splenic artery. After confirmatory arteriography, the vessel was occluded with two 8 mm interlock coils. Follow-up arteriography demonstrates stasis of flow in the main splenic artery. Catheters removed. After confirmatory femoral arteriogram, sheath removed and hemostasis achieved using Exoseal device. The patient tolerated the procedure well. FLUOROSCOPY TIME:  7 minutes 18 seconds; 614 mGy COMPLICATIONS: None immediate. IMPRESSION: 1. No overt active extravasation from splenic artery branches. 2. Technically successful coil embolization of  splenic artery. Electronically Signed   By: Lucrezia Europe M.D.   On: 08/01/2018 08:04   Ir Angiogram Selective Each Additional Vessel  Result Date:  08/01/2018 INDICATION: Splenic rupture with large subcapsular hematoma. Prophylactic embolization requested. EXAM: ULTRASOUND GUIDANCE FOR VASCULAR ACCESS SELECTIVE VISCERAL ARTERIOGRAM COIL EMBOLIZATION SPLENIC ARTERY MEDICATIONS: none indicated ANESTHESIA/SEDATION: Intravenous Fentanyl 138mcg and Versed 3mg  were administered as conscious sedation during continuous monitoring of the patient's level of consciousness and physiological / cardiorespiratory status by the radiology RN, with a total moderate sedation time of 29 minutes. CONTRAST:  40 mL Omnipaque 300 IA PROCEDURE: Informed consent was obtained from the patient following explanation of the procedure, risks, benefits and alternatives. The patient understands, agrees and consents for the procedure. All questions were addressed. A time out was performed prior to the initiation of the procedure. Maximal barrier sterile technique utilized including caps, mask, sterile gowns, sterile gloves, large sterile drape, hand hygiene, and chlorhexidine prep. Patency of right common femoral artery was confirmed with ultrasound and documentation stored. Skin entry site infiltrated with 1% lidocaine. Under real-time ultrasound guidance, the vessel was accessed with a 21-gauge micropuncture needle, exchanged over a 018 guidewire for a transitional dilator, through which a 035 guidewire was advanced. Over this, a 5 [place] vascular sheath placed. Through this, a 5 French C2 catheter advanced in the celiac axis selected. Celiac arteriography was performed. Tortuous widely patent splenic artery identified. Using an angled Glidewire, the catheter was advanced into the mid splenic artery. Confirmatory selective arteriography was performed. There is distortion of splenic intraparenchymal branches secondary to large subcapsular hematoma. No active extravasation. Through the angiographic catheter, a Renegade microcatheter was advanced into the distal splenic artery. After  confirmatory arteriography, the vessel was occluded with two 8 mm interlock coils. Follow-up arteriography demonstrates stasis of flow in the main splenic artery. Catheters removed. After confirmatory femoral arteriogram, sheath removed and hemostasis achieved using Exoseal device. The patient tolerated the procedure well. FLUOROSCOPY TIME:  7 minutes 18 seconds; 431 mGy COMPLICATIONS: None immediate. IMPRESSION: 1. No overt active extravasation from splenic artery branches. 2. Technically successful coil embolization of  splenic artery. Electronically Signed   By: Lucrezia Europe M.D.   On: 08/01/2018 08:04   Nm Pet Image Initial (pi) Skull Base To Thigh  Result Date: 07/31/2018 CLINICAL DATA:  Initial treatment strategy for ovarian carcinoma. EXAM: NUCLEAR MEDICINE PET SKULL BASE TO THIGH TECHNIQUE: 8.16 mCi F-18 FDG was injected intravenously. Full-ring PET imaging was performed from the skull  base to thigh after the radiotracer. CT data was obtained and used for attenuation correction and anatomic localization. Fasting blood glucose: 102 mg/dl COMPARISON:  CT 07/10/2018 FINDINGS: Mediastinal blood pool activity: SUV max 2.94 Liver activity: SUV max 4 NECK: No hypermetabolic lymph nodes in the neck. Incidental CT findings: none CHEST: Rind of hypermetabolic activity along the medial mediastinum pleural surface of the LEFT lung consistent with talc pleurodesis. There is loculated lung at the LEFT lung base with a rim of hypermetabolic activity also related to pleurodesis. No hypermetabolic mediastinal lymph nodes. Mild metabolic activity associated the mid esophagus (image 71) Incidental CT findings: No suspicious pulmonary nodules. PleurX catheter in place. ABDOMEN/PELVIS: There is hypermetabolic tissue associated with the enlarged ovaries. The hypermetabolic portion corresponds to the thickened soft tissue component along the walls of the cystic ovarian masses. The RIGHT ovarian mass measures 5.8 Cm with SUV max  equal 6.7 along the soft tissue wall thickening portion. The cystic portion is non metabolic. The LEFT ovary is smaller measuring 4.3 cm with hypermetabolic tissue medially similar to the RIGHT. There is a rim of hypermetabolic activity along the inferior margin of the RIGHT hepatic lobe which corresponds to subtle peritoneal thickening. This metabolic activity is above background liver activity with SUV max equal 5.0 compared to 3.0 in the background liver. A similar hypermetabolic peritoneal thickening in the LEFT ventral peritoneal surface with SUV max equal 3.4 on image 112. Mild hypermetabolic nodularity in the greater omentum with SUV max equal 3.0 on image 113. Incidental CT findings: There is a large splenic hematoma superior to the spleen confined superiorly by the LEFT hemidiaphragm. This large round hematoma measures 10.5 x 11.3 by 9.0 cm. Within the hematoma is bounded inferiorly by the spleen superiorly by the diaphragm as seen on coronal image 67/126 and PET-CT image 131. SKELETON: No focal hypermetabolic activity to suggest skeletal metastasis. Incidental CT findings: none IMPRESSION: 1. Hypermetabolic tissue associated with cystic masses of the LEFT RIGHT ovary consistent with ovarian carcinoma. 2. Evidence of carcinoma metastasis with rind of hypermetabolic tissue along the inferior margin of liver RIGHT hepatic lobe as well as on along the ventral LEFT peritoneal space and greater omentum. 3. Hypermetabolic tissue in the LEFT pleural space is related to talc pleurodesis. Cannot exclude underlying malignancy. 4. Incidental finding of a large hematoma in LEFT upper quadrant bound superiorly by the LEFT hemidiaphragm and inferiorly by the spleen. Recommend CTA of the abdomen to evaluate for persistent hemorrhage from the spleen and surgical consultation. Findings conveyed toSREEDHAR KATRAGADDA on 07/31/2018  at13:20. Electronically Signed   By: Suzy Bouchard M.D.   On: 07/31/2018 13:31   Ir US  Guide Vasc Access Right  Result Date: 08/01/2018 INDICATION: Splenic rupture with large subcapsular hematoma. Prophylactic embolization requested. EXAM: ULTRASOUND GUIDANCE FOR VASCULAR ACCESS SELECTIVE VISCERAL ARTERIOGRAM COIL EMBOLIZATION SPLENIC ARTERY MEDICATIONS: none indicated ANESTHESIA/SEDATION: Intravenous Fentanyl 131mcg and Versed 3mg  were administered as conscious sedation during continuous monitoring of the patient's level of consciousness and physiological / cardiorespiratory status by the radiology RN, with a total moderate sedation time of 29 minutes. CONTRAST:  40 mL Omnipaque 300 IA PROCEDURE: Informed consent was obtained from the patient following explanation of the procedure, risks, benefits and alternatives. The patient understands, agrees and consents for the procedure. All questions were addressed. A time out was performed prior to the initiation of the procedure. Maximal barrier sterile technique utilized including caps, mask, sterile gowns, sterile gloves, large sterile drape, hand hygiene, and chlorhexidine prep.  Patency of right common femoral artery was confirmed with ultrasound and documentation stored. Skin entry site infiltrated with 1% lidocaine. Under real-time ultrasound guidance, the vessel was accessed with a 21-gauge micropuncture needle, exchanged over a 018 guidewire for a transitional dilator, through which a 035 guidewire was advanced. Over this, a 5 [place] vascular sheath placed. Through this, a 5 French C2 catheter advanced in the celiac axis selected. Celiac arteriography was performed. Tortuous widely patent splenic artery identified. Using an angled Glidewire, the catheter was advanced into the mid splenic artery. Confirmatory selective arteriography was performed. There is distortion of splenic intraparenchymal branches secondary to large subcapsular hematoma. No active extravasation. Through the angiographic catheter, a Renegade microcatheter was advanced into the  distal splenic artery. After confirmatory arteriography, the vessel was occluded with two 8 mm interlock coils. Follow-up arteriography demonstrates stasis of flow in the main splenic artery. Catheters removed. After confirmatory femoral arteriogram, sheath removed and hemostasis achieved using Exoseal device. The patient tolerated the procedure well. FLUOROSCOPY TIME:  7 minutes 18 seconds; 517 mGy COMPLICATIONS: None immediate. IMPRESSION: 1. No overt active extravasation from splenic artery branches. 2. Technically successful coil embolization of  splenic artery. Electronically Signed   By: Lucrezia Europe M.D.   On: 08/01/2018 08:04   Dg Chest Port 1 View  Result Date: 07/31/2018 CLINICAL DATA:  PleurX, effusion, suspected intra-abdominal hemorrhage on PET-CT EXAM: PORTABLE CHEST 1 VIEW COMPARISON:  Same day PET-CT, chest radiograph, 07/18/2018 FINDINGS: No significant change in chest radiograph with a moderate left pleural effusion and associated atelectasis or consolidation, with a tunneled left-sided chest catheter in position about the posterior pleural space. The right lung is normally aerated. Cardiomegaly. IMPRESSION: No significant change in chest radiograph with a moderate left pleural effusion and associated atelectasis or consolidation, with a tunneled left-sided chest catheter in position about the posterior pleural space. The right lung is normally aerated. Cardiomegaly. Electronically Signed   By: Eddie Candle M.D.   On: 07/31/2018 15:37   [place] Guide Roadmapping  Result Date: 08/01/2018 INDICATION: Splenic rupture with large subcapsular hematoma. Prophylactic embolization requested. EXAM: ULTRASOUND GUIDANCE FOR VASCULAR ACCESS SELECTIVE VISCERAL ARTERIOGRAM COIL EMBOLIZATION SPLENIC ARTERY MEDICATIONS: none indicated ANESTHESIA/SEDATION: Intravenous Fentanyl 175mcg and Versed 3mg  were administered as conscious sedation during continuous monitoring of the  patient's level of consciousness and physiological / cardiorespiratory status by the radiology RN, with a total moderate sedation time of 29 minutes. CONTRAST:  40 mL Omnipaque 300 IA PROCEDURE: Informed consent was obtained from the patient following explanation of the procedure, risks, benefits and alternatives. The patient understands, agrees and consents for the procedure. All questions were addressed. A time out was performed prior to the initiation of the procedure. Maximal barrier sterile technique utilized including caps, mask, sterile gowns, sterile gloves, large sterile drape, hand hygiene, and chlorhexidine prep. Patency of right common femoral artery was confirmed with ultrasound and documentation stored. Skin entry site infiltrated with 1% lidocaine. Under real-time ultrasound guidance, the vessel was accessed with a 21-gauge micropuncture needle, exchanged over a 018 guidewire for a transitional dilator, through which a 035 guidewire was advanced. Over this, a 5 [place] vascular sheath placed. Through this, a 5 French C2 catheter advanced in the celiac axis selected. Celiac arteriography was performed. Tortuous widely patent splenic artery identified. Using an angled Glidewire, the catheter was advanced into the mid splenic artery. Confirmatory selective arteriography was performed. There is distortion of splenic intraparenchymal branches secondary  to large subcapsular hematoma. No active extravasation. Through the angiographic catheter, a Renegade microcatheter was advanced into the distal splenic artery. After confirmatory arteriography, the vessel was occluded with two 8 mm interlock coils. Follow-up arteriography demonstrates stasis of flow in the main splenic artery. Catheters removed. After confirmatory femoral arteriogram, sheath removed and hemostasis achieved using Exoseal device. The patient tolerated the procedure well. FLUOROSCOPY TIME:  7 minutes 18 seconds; 627 mGy COMPLICATIONS: None  immediate. IMPRESSION: 1. No overt active extravasation from splenic artery branches. 2. Technically successful coil embolization of  splenic artery. Electronically Signed   By: Lucrezia Europe M.D.   On: 08/01/2018 08:04   Ct Angio Abd/pel W And/or Wo Contrast  Result Date: 07/31/2018 CLINICAL DATA:  Operative complication of a Pleurx catheter placement. EXAM: CT ANGIOGRAPHY CHEST, ABDOMEN AND PELVIS TECHNIQUE: Multidetector CT imaging through the chest, abdomen and pelvis was performed using the standard protocol during bolus administration of intravenous contrast. Multiplanar reconstructed images and MIPs were obtained and reviewed to evaluate the vascular anatomy. CONTRAST:  137mL OMNIPAQUE IOHEXOL 350 MG/ML SOLN COMPARISON:  Same day PET-CT FINDINGS: CTA CHEST FINDINGS Cardiovascular: Preferential opacification of the thoracic aorta. Evaluation of the aortic root limited due to cardiac pulsation artifact. No convincing evidence of thoracic aortic aneurysm or dissection. Normal heart size. No pericardial effusion. Mediastinum/Nodes: Several reactive but not pathologically enlarged mediastinal and left hilar nodes are present, likely reactive to the adjacent inflammatory features given absence of uptake on PET-CT. Similarly a subcentimeter hypoattenuating nodule in the left lobe thyroid without uptake on comparison PET images is likely benign. There is nonspecific thickening of the mid esophagus and a moderate hiatal hernia. The airways are diffusely thickened with scattered secretions. There is extension of a large perisplenic hematoma through the diaphragmatic hiatus and into the lower posterior mediastinum. Lungs/Pleura: Hypoventilatory changes are present along with dependent atelectasis posteriorly. Nodular, rind-like pleural thickening throughout the left hemithorax and within the fissures is similar to PET-CT. No suspicious nodules. A small, loculated left pleural effusion is again seen. Left Pleurx  catheter in place terminating along the left mediastinal border directed apically. Musculoskeletal: No chest wall mass or suspicious bone lesions identified. Review of the MIP images confirms the above findings. CTA ABDOMEN AND PELVIS FINDINGS VASCULAR Aorta: The aorta is normal caliber. No intramural hematoma, dissection flap or other luminal abnormality of the aorta is seen. No periaortic stranding or hemorrhage. Minimal atheromatous plaque near the aortic bifurcation. No aneurysm or ectasia. Celiac: Patent without evidence of aneurysm, dissection, vasculitis or significant stenosis. SMA: Patent without evidence of aneurysm, dissection, vasculitis or significant stenosis. Renals: Both renal arteries are patent without evidence of aneurysm, dissection, vasculitis, fibromuscular dysplasia or significant stenosis. IMA: Patent without evidence of aneurysm, dissection, vasculitis or significant stenosis. Inflow: Patent without evidence of aneurysm, dissection, vasculitis or significant stenosis. Veins: No obvious venous abnormality within the limitations of this arterial phase study. Review of the MIP images confirms the above findings. NON-VASCULAR Hepatobiliary: No focal liver abnormality or hepatic injury is seen. Small amount of perihepatic hemorrhage likely tracking from the site of splenic injury. Patient is post cholecystectomy. Slight prominence of the biliary tree likely related to reservoir effect. No calcified intraductal gallstones. Pancreas: Unremarkable. No pancreatic ductal dilatation or surrounding inflammatory changes. Spleen: There is a 3.9 cm splenic laceration with a faint central hyperattenuating focus which remains stable on precontrast images from PET-CT as well as post-contrast venous phase and delayed images. There is surrounding mixed attenuation hemorrhage. This capsular  hemorrhage partially protrudes into the diaphragmatic hiatus (axial series 12, image 7). Extracapsular hemorrhage is  present as well, partially decompressing into the lesser sac along the greater curvature of the stomach. Additional hemoperitoneum is present in the right perihepatic space, both pericolic gutters and layering within the pelvis. Adrenals/Urinary Tract: No adrenal hemorrhage or renal injury identified. Bladder is unremarkable. Stomach/Bowel: Moderate hiatal hernia. Fluid collection along the greater curvature of the Lymphatic: No enlarged abdominopelvic lymph nodes. Reproductive: Bilateral thick-walled cystic lesions within both enlarged ovaries compatible with patient's known ovarian neoplasms. Anteverted uterus. Other: Hemoperitoneum, as detailed splenic section above. No free gas. Extensive omental caking and thick walled enhancement of the anterior peritoneal surfaces. Musculoskeletal: Diffuse body wall edema. No acute osseous abnormality or suspicious osseous lesion. Postsurgical changes from PleurX catheter placement with stranding in the left anterior flank. Catheter enters at the anterior left sixth interspace. Review of the MIP images confirms the above findings. IMPRESSION: 3.9 cm splenic laceration with central hyperattenuation concerning for contained vascular injury with capsular hematoma extending over greater than 80% of the splenic surface and with intraperitoneal extension of hemorrhage. Findings are compatible with a AAST grade V splenic injury. Urgent surgical consultation is warranted. PleurX catheter in the left 6th interspace, anterior axillary line and terminates along the mediastinal border directed apically. Redemonstration of the complex thick-walled cystic lesions in both adnexa, compatible with known ovarian neoplasm. Extensive omental caking is present as well as thickening along the anterior liver. Rind like thickening of the pleural surfaces within the left hemithorax and associated volume loss with trace effusion. Could reflect postprocedural changes from prior pleurodesis though and  malignancy is not excluded. These results were called by telephone at the time of interpretation on 07/31/2018 at 6:15 pm to Dr. Francine Graven, who verbally acknowledged these results. Electronically Signed   By: Lovena Le M.D.   On: 07/31/2018 18:15    Anti-infectives: Anti-infectives (From admission, onward)   None      Assessment/Plan: s/p * No surgery found * Check serial hg. embolization of spleen last night. Hemodynamically stable Continue w/u for metastatic ovarian cancer.  Will follow  LOS: 1 day    Michelle Holt 08/01/2018

## 2018-08-01 NOTE — Sedation Documentation (Signed)
5Fr sheath removed from R femoral artery by Armando Reichert, RT-R. Hemostasis achieved using Exoseal closure device.

## 2018-08-01 NOTE — Procedures (Signed)
  Procedure: Splenic arteriogram and coil embolization   EBL:   minimal Complications:  none immediate  See full dictation in [facility].  Dillard Cannon MD Main # [phone] Pager  [phone]

## 2018-08-01 NOTE — Progress Notes (Signed)
Referring Physician(s): Dr Deland Pretty  Supervising Physician: Arne Cleveland  Patient Status:  [facility] - In-pt  Chief Complaint:  7/18 early am Procedure: Splenic arteriogram and coil embolization    Subjective:  Resting Soreness at left PleurX site Otherwise good Has ambulated in room Urinated well  Allergies: Morphine and related, Nortriptyline, Topamax [topiramate], Actifed cold-allergy [chlorpheniramine-phenyleph er], Amoxicillin, Codeine, Erythromycin, Penicillins, and Sudafed [pseudoephedrine hcl]  Medications: Prior to Admission medications   Medication Sig Start Date End Date Taking? Authorizing Provider  diazepam (VALIUM) 5 MG tablet TAKE ONE TABLET BY MOUTH AT BEDTIME AS NEEDED FOR ANXIETY 07/27/18  Yes Kathrynn Ducking, MD  docusate sodium (COLACE) 100 MG capsule Take 1 capsule (100 mg total) by mouth 2 (two) times daily. 07/16/18 10/14/18 Yes Donne Hazel, MD  oxyCODONE 10 MG TABS Take 1 tablet (10 mg total) by mouth every 4 (four) hours as needed for severe pain or breakthrough pain. 07/22/18  Yes Derek Jack, MD  pantoprazole (PROTONIX) 40 MG tablet Take 1 tablet (40 mg total) by mouth 2 (two) times daily before a meal. 07/21/18 08/20/18 Yes Shah, Pratik D, DO  promethazine (PHENERGAN) 25 MG tablet Take 1 tablet (25 mg total) by mouth every 6 (six) hours as needed for nausea, vomiting or refractory nausea / vomiting. 07/16/18  Yes Donne Hazel, MD  senna-docusate (SENOKOT-S) 8.6-50 MG tablet Take 1 tablet by mouth at bedtime. 07/16/18 08/15/18 Yes Donne Hazel, MD  SUMAtriptan (IMITREX) 100 MG tablet TAKE ONE TABLET BY MOUTH PRN UP to TWICE DAILY AS NEEDED. Patient taking differently: Take 100 mg by mouth 2 (two) times daily as needed for migraine.  02/11/18  Yes Kathrynn Ducking, MD  benzonatate (TESSALON PERLES) 100 MG capsule Take 1 capsule (100 mg total) by mouth 3 (three) times daily as needed for cough. Patient not taking: Reported on 07/31/2018 07/16/18 07/16/19   Donne Hazel, MD     Vital Signs: BP (!) 103/59 (BP Location: Left Arm)    Pulse 94    Temp 98.7 F (37.1 C) (Oral)    Resp 15    Ht 5\' 6"  (1.676 m)    Wt 159 lb 13.3 oz (72.5 kg)    SpO2 94%    BMI 25.80 kg/m   Physical Exam Skin:    General: Skin is warm and dry.     Comments: Right groin site is clean and dry NT no bleeding Rt foot 2+ pulses     Imaging: Ct Angio Chest Pe W And/or Wo Contrast  Result Date: 07/31/2018 CLINICAL DATA:  Operative complication of a Pleurx catheter placement. EXAM: CT ANGIOGRAPHY CHEST, ABDOMEN AND PELVIS TECHNIQUE: Multidetector CT imaging through the chest, abdomen and pelvis was performed using the standard protocol during bolus administration of intravenous contrast. Multiplanar reconstructed images and MIPs were obtained and reviewed to evaluate the vascular anatomy. CONTRAST:  170mL OMNIPAQUE IOHEXOL 350 MG/ML SOLN COMPARISON:  Same day PET-CT FINDINGS: CTA CHEST FINDINGS Cardiovascular: Preferential opacification of the thoracic aorta. Evaluation of the aortic root limited due to cardiac pulsation artifact. No convincing evidence of thoracic aortic aneurysm or dissection. Normal heart size. No pericardial effusion. Mediastinum/Nodes: Several reactive but not pathologically enlarged mediastinal and left hilar nodes are present, likely reactive to the adjacent inflammatory features given absence of uptake on PET-CT. Similarly a subcentimeter hypoattenuating nodule in the left lobe thyroid without uptake on comparison PET images is likely benign. There is nonspecific thickening of the mid esophagus and  a moderate hiatal hernia. The airways are diffusely thickened with scattered secretions. There is extension of a large perisplenic hematoma through the diaphragmatic hiatus and into the lower posterior mediastinum. Lungs/Pleura: Hypoventilatory changes are present along with dependent atelectasis posteriorly. Nodular, rind-like pleural thickening throughout  the left hemithorax and within the fissures is similar to PET-CT. No suspicious nodules. A small, loculated left pleural effusion is again seen. Left Pleurx catheter in place terminating along the left mediastinal border directed apically. Musculoskeletal: No chest wall mass or suspicious bone lesions identified. Review of the MIP images confirms the above findings. CTA ABDOMEN AND PELVIS FINDINGS VASCULAR Aorta: The aorta is normal caliber. No intramural hematoma, dissection flap or other luminal abnormality of the aorta is seen. No periaortic stranding or hemorrhage. Minimal atheromatous plaque near the aortic bifurcation. No aneurysm or ectasia. Celiac: Patent without evidence of aneurysm, dissection, vasculitis or significant stenosis. SMA: Patent without evidence of aneurysm, dissection, vasculitis or significant stenosis. Renals: Both renal arteries are patent without evidence of aneurysm, dissection, vasculitis, fibromuscular dysplasia or significant stenosis. IMA: Patent without evidence of aneurysm, dissection, vasculitis or significant stenosis. Inflow: Patent without evidence of aneurysm, dissection, vasculitis or significant stenosis. Veins: No obvious venous abnormality within the limitations of this arterial phase study. Review of the MIP images confirms the above findings. NON-VASCULAR Hepatobiliary: No focal liver abnormality or hepatic injury is seen. Small amount of perihepatic hemorrhage likely tracking from the site of splenic injury. Patient is post cholecystectomy. Slight prominence of the biliary tree likely related to reservoir effect. No calcified intraductal gallstones. Pancreas: Unremarkable. No pancreatic ductal dilatation or surrounding inflammatory changes. Spleen: There is a 3.9 cm splenic laceration with a faint central hyperattenuating focus which remains stable on precontrast images from PET-CT as well as post-contrast venous phase and delayed images. There is surrounding mixed  attenuation hemorrhage. This capsular hemorrhage partially protrudes into the diaphragmatic hiatus (axial series 12, image 7). Extracapsular hemorrhage is present as well, partially decompressing into the lesser sac along the greater curvature of the stomach. Additional hemoperitoneum is present in the right perihepatic space, both pericolic gutters and layering within the pelvis. Adrenals/Urinary Tract: No adrenal hemorrhage or renal injury identified. Bladder is unremarkable. Stomach/Bowel: Moderate hiatal hernia. Fluid collection along the greater curvature of the Lymphatic: No enlarged abdominopelvic lymph nodes. Reproductive: Bilateral thick-walled cystic lesions within both enlarged ovaries compatible with patient's known ovarian neoplasms. Anteverted uterus. Other: Hemoperitoneum, as detailed splenic section above. No free gas. Extensive omental caking and thick walled enhancement of the anterior peritoneal surfaces. Musculoskeletal: Diffuse body wall edema. No acute osseous abnormality or suspicious osseous lesion. Postsurgical changes from PleurX catheter placement with stranding in the left anterior flank. Catheter enters at the anterior left sixth interspace. Review of the MIP images confirms the above findings. IMPRESSION: 3.9 cm splenic laceration with central hyperattenuation concerning for contained vascular injury with capsular hematoma extending over greater than 80% of the splenic surface and with intraperitoneal extension of hemorrhage. Findings are compatible with a AAST grade V splenic injury. Urgent surgical consultation is warranted. PleurX catheter in the left 6th interspace, anterior axillary line and terminates along the mediastinal border directed apically. Redemonstration of the complex thick-walled cystic lesions in both adnexa, compatible with known ovarian neoplasm. Extensive omental caking is present as well as thickening along the anterior liver. Rind like thickening of the pleural  surfaces within the left hemithorax and associated volume loss with trace effusion. Could reflect postprocedural changes from prior pleurodesis though and malignancy  is not excluded. These results were called by telephone at the time of interpretation on 07/31/2018 at 6:15 pm to Dr. Francine Graven, who verbally acknowledged these results. Electronically Signed   By: Lovena Le M.D.   On: 07/31/2018 18:15   Ir Angiogram Visceral Selective  Result Date: 08/01/2018 INDICATION: Splenic rupture with large subcapsular hematoma. Prophylactic embolization requested. EXAM: ULTRASOUND GUIDANCE FOR VASCULAR ACCESS SELECTIVE VISCERAL ARTERIOGRAM COIL EMBOLIZATION SPLENIC ARTERY MEDICATIONS: none indicated ANESTHESIA/SEDATION: Intravenous Fentanyl 120mcg and Versed 3mg  were administered as conscious sedation during continuous monitoring of the patient's level of consciousness and physiological / cardiorespiratory status by the radiology RN, with a total moderate sedation time of 29 minutes. CONTRAST:  40 mL Omnipaque 300 IA PROCEDURE: Informed consent was obtained from the patient following explanation of the procedure, risks, benefits and alternatives. The patient understands, agrees and consents for the procedure. All questions were addressed. A time out was performed prior to the initiation of the procedure. Maximal barrier sterile technique utilized including caps, mask, sterile gowns, sterile gloves, large sterile drape, hand hygiene, and chlorhexidine prep. Patency of right common femoral artery was confirmed with ultrasound and documentation stored. Skin entry site infiltrated with 1% lidocaine. Under real-time ultrasound guidance, the vessel was accessed with a 21-gauge micropuncture needle, exchanged over a 018 guidewire for a transitional dilator, through which a 035 guidewire was advanced. Over this, a 5 [place] vascular sheath placed. Through this, a 5 French C2 catheter advanced in the celiac axis selected.  Celiac arteriography was performed. Tortuous widely patent splenic artery identified. Using an angled Glidewire, the catheter was advanced into the mid splenic artery. Confirmatory selective arteriography was performed. There is distortion of splenic intraparenchymal branches secondary to large subcapsular hematoma. No active extravasation. Through the angiographic catheter, a Renegade microcatheter was advanced into the distal splenic artery. After confirmatory arteriography, the vessel was occluded with two 8 mm interlock coils. Follow-up arteriography demonstrates stasis of flow in the main splenic artery. Catheters removed. After confirmatory femoral arteriogram, sheath removed and hemostasis achieved using Exoseal device. The patient tolerated the procedure well. FLUOROSCOPY TIME:  7 minutes 18 seconds; 361 mGy COMPLICATIONS: None immediate. IMPRESSION: 1. No overt active extravasation from splenic artery branches. 2. Technically successful coil embolization of  splenic artery. Electronically Signed   By: Lucrezia Europe M.D.   On: 08/01/2018 08:04   Ir Angiogram Selective Each Additional Vessel  Result Date: 08/01/2018 INDICATION: Splenic rupture with large subcapsular hematoma. Prophylactic embolization requested. EXAM: ULTRASOUND GUIDANCE FOR VASCULAR ACCESS SELECTIVE VISCERAL ARTERIOGRAM COIL EMBOLIZATION SPLENIC ARTERY MEDICATIONS: none indicated ANESTHESIA/SEDATION: Intravenous Fentanyl 132mcg and Versed 3mg  were administered as conscious sedation during continuous monitoring of the patient's level of consciousness and physiological / cardiorespiratory status by the radiology RN, with a total moderate sedation time of 29 minutes. CONTRAST:  40 mL Omnipaque 300 IA PROCEDURE: Informed consent was obtained from the patient following explanation of the procedure, risks, benefits and alternatives. The patient understands, agrees and consents for the procedure. All questions were addressed. A time out was  performed prior to the initiation of the procedure. Maximal barrier sterile technique utilized including caps, mask, sterile gowns, sterile gloves, large sterile drape, hand hygiene, and chlorhexidine prep. Patency of right common femoral artery was confirmed with ultrasound and documentation stored. Skin entry site infiltrated with 1% lidocaine. Under real-time ultrasound guidance, the vessel was accessed with a 21-gauge micropuncture needle, exchanged over a 018 guidewire for a transitional dilator, through which a 035 guidewire was advanced. Over  this, a 5 [place] vascular sheath placed. Through this, a 5 French C2 catheter advanced in the celiac axis selected. Celiac arteriography was performed. Tortuous widely patent splenic artery identified. Using an angled Glidewire, the catheter was advanced into the mid splenic artery. Confirmatory selective arteriography was performed. There is distortion of splenic intraparenchymal branches secondary to large subcapsular hematoma. No active extravasation. Through the angiographic catheter, a Renegade microcatheter was advanced into the distal splenic artery. After confirmatory arteriography, the vessel was occluded with two 8 mm interlock coils. Follow-up arteriography demonstrates stasis of flow in the main splenic artery. Catheters removed. After confirmatory femoral arteriogram, sheath removed and hemostasis achieved using Exoseal device. The patient tolerated the procedure well. FLUOROSCOPY TIME:  7 minutes 18 seconds; 166 mGy COMPLICATIONS: None immediate. IMPRESSION: 1. No overt active extravasation from splenic artery branches. 2. Technically successful coil embolization of  splenic artery. Electronically Signed   By: Lucrezia Europe M.D.   On: 08/01/2018 08:04   Nm Pet Image Initial (pi) Skull Base To Thigh  Result Date: 07/31/2018 CLINICAL DATA:  Initial treatment strategy for ovarian carcinoma. EXAM: NUCLEAR MEDICINE PET SKULL BASE TO THIGH TECHNIQUE: 8.16 mCi  F-18 FDG was injected intravenously. Full-ring PET imaging was performed from the skull base to thigh after the radiotracer. CT data was obtained and used for attenuation correction and anatomic localization. Fasting blood glucose: 102 mg/dl COMPARISON:  CT 07/10/2018 FINDINGS: Mediastinal blood pool activity: SUV max 2.94 Liver activity: SUV max 4 NECK: No hypermetabolic lymph nodes in the neck. Incidental CT findings: none CHEST: Rind of hypermetabolic activity along the medial mediastinum pleural surface of the LEFT lung consistent with talc pleurodesis. There is loculated lung at the LEFT lung base with a rim of hypermetabolic activity also related to pleurodesis. No hypermetabolic mediastinal lymph nodes. Mild metabolic activity associated the mid esophagus (image 71) Incidental CT findings: No suspicious pulmonary nodules. PleurX catheter in place. ABDOMEN/PELVIS: There is hypermetabolic tissue associated with the enlarged ovaries. The hypermetabolic portion corresponds to the thickened soft tissue component along the walls of the cystic ovarian masses. The RIGHT ovarian mass measures 5.8 Cm with SUV max equal 6.7 along the soft tissue wall thickening portion. The cystic portion is non metabolic. The LEFT ovary is smaller measuring 4.3 cm with hypermetabolic tissue medially similar to the RIGHT. There is a rim of hypermetabolic activity along the inferior margin of the RIGHT hepatic lobe which corresponds to subtle peritoneal thickening. This metabolic activity is above background liver activity with SUV max equal 5.0 compared to 3.0 in the background liver. A similar hypermetabolic peritoneal thickening in the LEFT ventral peritoneal surface with SUV max equal 3.4 on image 112. Mild hypermetabolic nodularity in the greater omentum with SUV max equal 3.0 on image 113. Incidental CT findings: There is a large splenic hematoma superior to the spleen confined superiorly by the LEFT hemidiaphragm. This large round  hematoma measures 10.5 x 11.3 by 9.0 cm. Within the hematoma is bounded inferiorly by the spleen superiorly by the diaphragm as seen on coronal image 67/126 and PET-CT image 131. SKELETON: No focal hypermetabolic activity to suggest skeletal metastasis. Incidental CT findings: none IMPRESSION: 1. Hypermetabolic tissue associated with cystic masses of the LEFT RIGHT ovary consistent with ovarian carcinoma. 2. Evidence of carcinoma metastasis with rind of hypermetabolic tissue along the inferior margin of liver RIGHT hepatic lobe as well as on along the ventral LEFT peritoneal space and greater omentum. 3. Hypermetabolic tissue in the LEFT pleural space is  related to talc pleurodesis. Cannot exclude underlying malignancy. 4. Incidental finding of a large hematoma in LEFT upper quadrant bound superiorly by the LEFT hemidiaphragm and inferiorly by the spleen. Recommend CTA of the abdomen to evaluate for persistent hemorrhage from the spleen and surgical consultation. Findings conveyed toSREEDHAR KATRAGADDA on 07/31/2018  at13:20. Electronically Signed   By: Suzy Bouchard M.D.   On: 07/31/2018 13:31   Ir US Guide Vasc Access Right  Result Date: 08/01/2018 INDICATION: Splenic rupture with large subcapsular hematoma. Prophylactic embolization requested. EXAM: ULTRASOUND GUIDANCE FOR VASCULAR ACCESS SELECTIVE VISCERAL ARTERIOGRAM COIL EMBOLIZATION SPLENIC ARTERY MEDICATIONS: none indicated ANESTHESIA/SEDATION: Intravenous Fentanyl 154mcg and Versed 3mg  were administered as conscious sedation during continuous monitoring of the patient's level of consciousness and physiological / cardiorespiratory status by the radiology RN, with a total moderate sedation time of 29 minutes. CONTRAST:  40 mL Omnipaque 300 IA PROCEDURE: Informed consent was obtained from the patient following explanation of the procedure, risks, benefits and alternatives. The patient understands, agrees and consents for the procedure. All questions were  addressed. A time out was performed prior to the initiation of the procedure. Maximal barrier sterile technique utilized including caps, mask, sterile gowns, sterile gloves, large sterile drape, hand hygiene, and chlorhexidine prep. Patency of right common femoral artery was confirmed with ultrasound and documentation stored. Skin entry site infiltrated with 1% lidocaine. Under real-time ultrasound guidance, the vessel was accessed with a 21-gauge micropuncture needle, exchanged over a 018 guidewire for a transitional dilator, through which a 035 guidewire was advanced. Over this, a 5 [place] vascular sheath placed. Through this, a 5 French C2 catheter advanced in the celiac axis selected. Celiac arteriography was performed. Tortuous widely patent splenic artery identified. Using an angled Glidewire, the catheter was advanced into the mid splenic artery. Confirmatory selective arteriography was performed. There is distortion of splenic intraparenchymal branches secondary to large subcapsular hematoma. No active extravasation. Through the angiographic catheter, a Renegade microcatheter was advanced into the distal splenic artery. After confirmatory arteriography, the vessel was occluded with two 8 mm interlock coils. Follow-up arteriography demonstrates stasis of flow in the main splenic artery. Catheters removed. After confirmatory femoral arteriogram, sheath removed and hemostasis achieved using Exoseal device. The patient tolerated the procedure well. FLUOROSCOPY TIME:  7 minutes 18 seconds; 161 mGy COMPLICATIONS: None immediate. IMPRESSION: 1. No overt active extravasation from splenic artery branches. 2. Technically successful coil embolization of  splenic artery. Electronically Signed   By: Lucrezia Europe M.D.   On: 08/01/2018 08:04   Dg Chest Port 1 View  Result Date: 07/31/2018 CLINICAL DATA:  PleurX, effusion, suspected intra-abdominal hemorrhage on PET-CT EXAM: PORTABLE CHEST 1 VIEW COMPARISON:  Same day  PET-CT, chest radiograph, 07/18/2018 FINDINGS: No significant change in chest radiograph with a moderate left pleural effusion and associated atelectasis or consolidation, with a tunneled left-sided chest catheter in position about the posterior pleural space. The right lung is normally aerated. Cardiomegaly. IMPRESSION: No significant change in chest radiograph with a moderate left pleural effusion and associated atelectasis or consolidation, with a tunneled left-sided chest catheter in position about the posterior pleural space. The right lung is normally aerated. Cardiomegaly. Electronically Signed   By: Eddie Candle M.D.   On: 07/31/2018 15:37   [place] Guide Roadmapping  Result Date: 08/01/2018 INDICATION: Splenic rupture with large subcapsular hematoma. Prophylactic embolization requested. EXAM: ULTRASOUND GUIDANCE FOR VASCULAR ACCESS SELECTIVE VISCERAL ARTERIOGRAM COIL EMBOLIZATION SPLENIC ARTERY MEDICATIONS: none indicated  ANESTHESIA/SEDATION: Intravenous Fentanyl 153mcg and Versed 3mg  were administered as conscious sedation during continuous monitoring of the patient's level of consciousness and physiological / cardiorespiratory status by the radiology RN, with a total moderate sedation time of 29 minutes. CONTRAST:  40 mL Omnipaque 300 IA PROCEDURE: Informed consent was obtained from the patient following explanation of the procedure, risks, benefits and alternatives. The patient understands, agrees and consents for the procedure. All questions were addressed. A time out was performed prior to the initiation of the procedure. Maximal barrier sterile technique utilized including caps, mask, sterile gowns, sterile gloves, large sterile drape, hand hygiene, and chlorhexidine prep. Patency of right common femoral artery was confirmed with ultrasound and documentation stored. Skin entry site infiltrated with 1% lidocaine. Under real-time ultrasound guidance, the vessel was  accessed with a 21-gauge micropuncture needle, exchanged over a 018 guidewire for a transitional dilator, through which a 035 guidewire was advanced. Over this, a 5 [place] vascular sheath placed. Through this, a 5 French C2 catheter advanced in the celiac axis selected. Celiac arteriography was performed. Tortuous widely patent splenic artery identified. Using an angled Glidewire, the catheter was advanced into the mid splenic artery. Confirmatory selective arteriography was performed. There is distortion of splenic intraparenchymal branches secondary to large subcapsular hematoma. No active extravasation. Through the angiographic catheter, a Renegade microcatheter was advanced into the distal splenic artery. After confirmatory arteriography, the vessel was occluded with two 8 mm interlock coils. Follow-up arteriography demonstrates stasis of flow in the main splenic artery. Catheters removed. After confirmatory femoral arteriogram, sheath removed and hemostasis achieved using Exoseal device. The patient tolerated the procedure well. FLUOROSCOPY TIME:  7 minutes 18 seconds; 417 mGy COMPLICATIONS: None immediate. IMPRESSION: 1. No overt active extravasation from splenic artery branches. 2. Technically successful coil embolization of  splenic artery. Electronically Signed   By: Lucrezia Europe M.D.   On: 08/01/2018 08:04   Ct Angio Abd/pel W And/or Wo Contrast  Result Date: 07/31/2018 CLINICAL DATA:  Operative complication of a Pleurx catheter placement. EXAM: CT ANGIOGRAPHY CHEST, ABDOMEN AND PELVIS TECHNIQUE: Multidetector CT imaging through the chest, abdomen and pelvis was performed using the standard protocol during bolus administration of intravenous contrast. Multiplanar reconstructed images and MIPs were obtained and reviewed to evaluate the vascular anatomy. CONTRAST:  133mL OMNIPAQUE IOHEXOL 350 MG/ML SOLN COMPARISON:  Same day PET-CT FINDINGS: CTA CHEST FINDINGS Cardiovascular: Preferential opacification of  the thoracic aorta. Evaluation of the aortic root limited due to cardiac pulsation artifact. No convincing evidence of thoracic aortic aneurysm or dissection. Normal heart size. No pericardial effusion. Mediastinum/Nodes: Several reactive but not pathologically enlarged mediastinal and left hilar nodes are present, likely reactive to the adjacent inflammatory features given absence of uptake on PET-CT. Similarly a subcentimeter hypoattenuating nodule in the left lobe thyroid without uptake on comparison PET images is likely benign. There is nonspecific thickening of the mid esophagus and a moderate hiatal hernia. The airways are diffusely thickened with scattered secretions. There is extension of a large perisplenic hematoma through the diaphragmatic hiatus and into the lower posterior mediastinum. Lungs/Pleura: Hypoventilatory changes are present along with dependent atelectasis posteriorly. Nodular, rind-like pleural thickening throughout the left hemithorax and within the fissures is similar to PET-CT. No suspicious nodules. A small, loculated left pleural effusion is again seen. Left Pleurx catheter in place terminating along the left mediastinal border directed apically. Musculoskeletal: No chest wall mass or suspicious bone lesions identified. Review of the MIP images confirms the above findings. CTA ABDOMEN AND  PELVIS FINDINGS VASCULAR Aorta: The aorta is normal caliber. No intramural hematoma, dissection flap or other luminal abnormality of the aorta is seen. No periaortic stranding or hemorrhage. Minimal atheromatous plaque near the aortic bifurcation. No aneurysm or ectasia. Celiac: Patent without evidence of aneurysm, dissection, vasculitis or significant stenosis. SMA: Patent without evidence of aneurysm, dissection, vasculitis or significant stenosis. Renals: Both renal arteries are patent without evidence of aneurysm, dissection, vasculitis, fibromuscular dysplasia or significant stenosis. IMA: Patent  without evidence of aneurysm, dissection, vasculitis or significant stenosis. Inflow: Patent without evidence of aneurysm, dissection, vasculitis or significant stenosis. Veins: No obvious venous abnormality within the limitations of this arterial phase study. Review of the MIP images confirms the above findings. NON-VASCULAR Hepatobiliary: No focal liver abnormality or hepatic injury is seen. Small amount of perihepatic hemorrhage likely tracking from the site of splenic injury. Patient is post cholecystectomy. Slight prominence of the biliary tree likely related to reservoir effect. No calcified intraductal gallstones. Pancreas: Unremarkable. No pancreatic ductal dilatation or surrounding inflammatory changes. Spleen: There is a 3.9 cm splenic laceration with a faint central hyperattenuating focus which remains stable on precontrast images from PET-CT as well as post-contrast venous phase and delayed images. There is surrounding mixed attenuation hemorrhage. This capsular hemorrhage partially protrudes into the diaphragmatic hiatus (axial series 12, image 7). Extracapsular hemorrhage is present as well, partially decompressing into the lesser sac along the greater curvature of the stomach. Additional hemoperitoneum is present in the right perihepatic space, both pericolic gutters and layering within the pelvis. Adrenals/Urinary Tract: No adrenal hemorrhage or renal injury identified. Bladder is unremarkable. Stomach/Bowel: Moderate hiatal hernia. Fluid collection along the greater curvature of the Lymphatic: No enlarged abdominopelvic lymph nodes. Reproductive: Bilateral thick-walled cystic lesions within both enlarged ovaries compatible with patient's known ovarian neoplasms. Anteverted uterus. Other: Hemoperitoneum, as detailed splenic section above. No free gas. Extensive omental caking and thick walled enhancement of the anterior peritoneal surfaces. Musculoskeletal: Diffuse body wall edema. No acute osseous  abnormality or suspicious osseous lesion. Postsurgical changes from PleurX catheter placement with stranding in the left anterior flank. Catheter enters at the anterior left sixth interspace. Review of the MIP images confirms the above findings. IMPRESSION: 3.9 cm splenic laceration with central hyperattenuation concerning for contained vascular injury with capsular hematoma extending over greater than 80% of the splenic surface and with intraperitoneal extension of hemorrhage. Findings are compatible with a AAST grade V splenic injury. Urgent surgical consultation is warranted. PleurX catheter in the left 6th interspace, anterior axillary line and terminates along the mediastinal border directed apically. Redemonstration of the complex thick-walled cystic lesions in both adnexa, compatible with known ovarian neoplasm. Extensive omental caking is present as well as thickening along the anterior liver. Rind like thickening of the pleural surfaces within the left hemithorax and associated volume loss with trace effusion. Could reflect postprocedural changes from prior pleurodesis though and malignancy is not excluded. These results were called by telephone at the time of interpretation on 07/31/2018 at 6:15 pm to Dr. Francine Graven, who verbally acknowledged these results. Electronically Signed   By: Lovena Le M.D.   On: 07/31/2018 18:15    Labs:  CBC: Recent Labs    07/19/18 0824 07/20/18 0529 07/21/18 0434 07/31/18 1500  WBC 18.7* 16.3* 14.1* 7.2  HGB 9.9* 10.3* 10.4* 12.0  HCT 29.8* 32.2* 32.3* 38.5  PLT 357 346 312 699*    COAGS: Recent Labs    07/07/18 0010 07/12/18 1219 07/13/18 0441 07/18/18 1425 07/31/18 1500  INR 1.0 1.1 1.0 1.1 1.1  APTT 30 28  --   --   --     BMP: Recent Labs    07/19/18 0824 07/20/18 0529 07/21/18 0434 07/31/18 1500  NA 134* 134* 135 136  K 3.9 4.0 3.6 3.8  CL 97* 95* 99 101  CO2 26 28 26 25   GLUCOSE 88 91 116* 95  BUN 14 8 8 9   CALCIUM 8.1*  8.5* 8.3* 9.0  CREATININE 0.67 0.65 0.65 0.63  GFRNONAA >60 >60 >60 >60  GFRAA >60 >60 >60 >60    LIVER FUNCTION TESTS: Recent Labs    07/12/18 1219 07/15/18 0424 07/18/18 1425 07/31/18 1500  BILITOT 0.6 0.5 0.7 0.9  AST 57* 19 32 25  ALT 141* 53* 48* 27  ALKPHOS 322* 203* 242* 138*  PROT 5.8* 5.5* 6.0* 7.1  ALBUMIN 2.2* 2.2* 2.6* 3.2*    Assessment and Plan:  Splenic embolization in IR 7/18 early am Doing well Will follow few days  Electronically Signed: Lavonia Drafts, PA-C 08/01/2018, 9:28 AM   I spent a total of 15 Minutes at the the patient's bedside AND on the patient's hospital floor or unit, greater than 50% of which was counseling/coordinating care for splenic artery embolization

## 2018-08-01 NOTE — Progress Notes (Signed)
Pt ambulated to the bathroom after bedrest completed. R groin still level 0-soft but sore. Pt tolerated well. Denies numbness or tingling in BLE. Will continue to monitor. Lajoyce Corners, RN

## 2018-08-01 NOTE — Progress Notes (Signed)
Pt returned from procedure in IR. Pt vital signs assessed and stable. R groin is soft without evidence of hematoma- level 0. Pt denies pain and is drowsy d/t medications given during procedure. Pulses +3 BLE. Pt moving all extremities equally. Will continue to monitor. Lajoyce Corners, RN

## 2018-08-02 LAB — CBC WITH DIFFERENTIAL/PLATELET
Abs Immature Granulocytes: 0.06 10*3/uL (ref 0.00–0.07)
Basophils Absolute: 0.1 10*3/uL (ref 0.0–0.1)
Basophils Relative: 1 %
Eosinophils Absolute: 0.2 10*3/uL (ref 0.0–0.5)
Eosinophils Relative: 2 %
HCT: 38.8 % (ref 36.0–46.0)
Hemoglobin: 12.6 g/dL (ref 12.0–15.0)
Immature Granulocytes: 1 %
Lymphocytes Relative: 14 %
Lymphs Abs: 1.4 10*3/uL (ref 0.7–4.0)
MCH: 30.4 pg (ref 26.0–34.0)
MCHC: 32.5 g/dL (ref 30.0–36.0)
MCV: 93.7 fL (ref 80.0–100.0)
Monocytes Absolute: 1.2 10*3/uL — ABNORMAL HIGH (ref 0.1–1.0)
Monocytes Relative: 12 %
Neutro Abs: 7.1 10*3/uL (ref 1.7–7.7)
Neutrophils Relative %: 70 %
Platelets: 384 10*3/uL (ref 150–400)
RBC: 4.14 MIL/uL (ref 3.87–5.11)
RDW: 14.1 % (ref 11.5–15.5)
WBC: 10 10*3/uL (ref 4.0–10.5)
nRBC: 0 % (ref 0.0–0.2)

## 2018-08-02 LAB — RENAL FUNCTION PANEL
Albumin: 2.7 g/dL — ABNORMAL LOW (ref 3.5–5.0)
Anion gap: 12 (ref 5–15)
BUN: <5 mg/dL — ABNORMAL LOW (ref 6–20)
CO2: 22 mmol/L (ref 22–32)
Calcium: 9.1 mg/dL (ref 8.9–10.3)
Chloride: 100 mmol/L (ref 98–111)
Creatinine, Ser: 0.75 mg/dL (ref 0.44–1.00)
GFR calc Af Amer: >60 mL/min (ref >60)
GFR calc non Af Amer: >60 mL/min (ref >60)
Glucose, Bld: 97 mg/dL (ref 70–99)
Phosphorus: 4.3 mg/dL (ref 2.5–4.6)
Potassium: 4.8 mmol/L (ref 3.5–5.1)
Sodium: 134 mmol/L — ABNORMAL LOW (ref 135–145)

## 2018-08-02 NOTE — Progress Notes (Signed)
Subjective/Chief Complaint: Complains of stable left upper quadrant abd pain - unchanged for last 2 weeks - comes/goes. No vomiting   Objective: Vital signs in last 24 hours: Temp:  [98.8 F (37.1 C)-99.7 F (37.6 C)] 98.8 F (37.1 C) (07/19 0446) Pulse Rate:  [85-86] 85 (07/19 0446) Resp:  [15-28] 15 (07/19 0446) BP: (97-110)/(50-69) 97/50 (07/19 0446) SpO2:  [90 %-98 %] 94 % (07/19 0446) Weight:  [72.4 kg] 72.4 kg (07/19 0449) Last BM Date: 07/30/18  Intake/Output from previous day: 07/18 0701 - 07/19 0700 In: 360 [P.O.:360] Out: -  Intake/Output this shift: No intake/output data recorded.  General appearance: alert and cooperative Resp: clear to auscultation bilaterally Cardio: regular rate and rhythm GI: soft, moderate left sided tenderness. No rebound, no guarding  Lab Results:  Recent Labs    07/31/18 1500 08/01/18 1212  WBC 7.2 8.8  HGB 12.0 11.6*  HCT 38.5 36.7  PLT 699* 667*   BMET Recent Labs    07/31/18 1500  NA 136  K 3.8  CL 101  CO2 25  GLUCOSE 95  BUN 9  CREATININE 0.63  CALCIUM 9.0   PT/INR Recent Labs    07/31/18 1500  LABPROT 13.7  INR 1.1   ABG No results for input(s): PHART, HCO3 in the last 72 hours.  Invalid input(s): PCO2, PO2  Studies/Results: Ct Angio Chest Pe W And/or Wo Contrast  Result Date: 07/31/2018 CLINICAL DATA:  Operative complication of a Pleurx catheter placement. EXAM: CT ANGIOGRAPHY CHEST, ABDOMEN AND PELVIS TECHNIQUE: Multidetector CT imaging through the chest, abdomen and pelvis was performed using the standard protocol during bolus administration of intravenous contrast. Multiplanar reconstructed images and MIPs were obtained and reviewed to evaluate the vascular anatomy. CONTRAST:  134mL OMNIPAQUE IOHEXOL 350 MG/ML SOLN COMPARISON:  Same day PET-CT FINDINGS: CTA CHEST FINDINGS Cardiovascular: Preferential opacification of the thoracic aorta. Evaluation of the aortic root limited due to cardiac pulsation  artifact. No convincing evidence of thoracic aortic aneurysm or dissection. Normal heart size. No pericardial effusion. Mediastinum/Nodes: Several reactive but not pathologically enlarged mediastinal and left hilar nodes are present, likely reactive to the adjacent inflammatory features given absence of uptake on PET-CT. Similarly a subcentimeter hypoattenuating nodule in the left lobe thyroid without uptake on comparison PET images is likely benign. There is nonspecific thickening of the mid esophagus and a moderate hiatal hernia. The airways are diffusely thickened with scattered secretions. There is extension of a large perisplenic hematoma through the diaphragmatic hiatus and into the lower posterior mediastinum. Lungs/Pleura: Hypoventilatory changes are present along with dependent atelectasis posteriorly. Nodular, rind-like pleural thickening throughout the left hemithorax and within the fissures is similar to PET-CT. No suspicious nodules. A small, loculated left pleural effusion is again seen. Left Pleurx catheter in place terminating along the left mediastinal border directed apically. Musculoskeletal: No chest wall mass or suspicious bone lesions identified. Review of the MIP images confirms the above findings. CTA ABDOMEN AND PELVIS FINDINGS VASCULAR Aorta: The aorta is normal caliber. No intramural hematoma, dissection flap or other luminal abnormality of the aorta is seen. No periaortic stranding or hemorrhage. Minimal atheromatous plaque near the aortic bifurcation. No aneurysm or ectasia. Celiac: Patent without evidence of aneurysm, dissection, vasculitis or significant stenosis. SMA: Patent without evidence of aneurysm, dissection, vasculitis or significant stenosis. Renals: Both renal arteries are patent without evidence of aneurysm, dissection, vasculitis, fibromuscular dysplasia or significant stenosis. IMA: Patent without evidence of aneurysm, dissection, vasculitis or significant stenosis.  Inflow: Patent without  evidence of aneurysm, dissection, vasculitis or significant stenosis. Veins: No obvious venous abnormality within the limitations of this arterial phase study. Review of the MIP images confirms the above findings. NON-VASCULAR Hepatobiliary: No focal liver abnormality or hepatic injury is seen. Small amount of perihepatic hemorrhage likely tracking from the site of splenic injury. Patient is post cholecystectomy. Slight prominence of the biliary tree likely related to reservoir effect. No calcified intraductal gallstones. Pancreas: Unremarkable. No pancreatic ductal dilatation or surrounding inflammatory changes. Spleen: There is a 3.9 cm splenic laceration with a faint central hyperattenuating focus which remains stable on precontrast images from PET-CT as well as post-contrast venous phase and delayed images. There is surrounding mixed attenuation hemorrhage. This capsular hemorrhage partially protrudes into the diaphragmatic hiatus (axial series 12, image 7). Extracapsular hemorrhage is present as well, partially decompressing into the lesser sac along the greater curvature of the stomach. Additional hemoperitoneum is present in the right perihepatic space, both pericolic gutters and layering within the pelvis. Adrenals/Urinary Tract: No adrenal hemorrhage or renal injury identified. Bladder is unremarkable. Stomach/Bowel: Moderate hiatal hernia. Fluid collection along the greater curvature of the Lymphatic: No enlarged abdominopelvic lymph nodes. Reproductive: Bilateral thick-walled cystic lesions within both enlarged ovaries compatible with patient's known ovarian neoplasms. Anteverted uterus. Other: Hemoperitoneum, as detailed splenic section above. No free gas. Extensive omental caking and thick walled enhancement of the anterior peritoneal surfaces. Musculoskeletal: Diffuse body wall edema. No acute osseous abnormality or suspicious osseous lesion. Postsurgical changes from PleurX  catheter placement with stranding in the left anterior flank. Catheter enters at the anterior left sixth interspace. Review of the MIP images confirms the above findings. IMPRESSION: 3.9 cm splenic laceration with central hyperattenuation concerning for contained vascular injury with capsular hematoma extending over greater than 80% of the splenic surface and with intraperitoneal extension of hemorrhage. Findings are compatible with a AAST grade V splenic injury. Urgent surgical consultation is warranted. PleurX catheter in the left 6th interspace, anterior axillary line and terminates along the mediastinal border directed apically. Redemonstration of the complex thick-walled cystic lesions in both adnexa, compatible with known ovarian neoplasm. Extensive omental caking is present as well as thickening along the anterior liver. Rind like thickening of the pleural surfaces within the left hemithorax and associated volume loss with trace effusion. Could reflect postprocedural changes from prior pleurodesis though and malignancy is not excluded. These results were called by telephone at the time of interpretation on 07/31/2018 at 6:15 pm to Dr. Francine Graven, who verbally acknowledged these results. Electronically Signed   By: Lovena Le M.D.   On: 07/31/2018 18:15   Ir Angiogram Visceral Selective  Result Date: 08/01/2018 INDICATION: Splenic rupture with large subcapsular hematoma. Prophylactic embolization requested. EXAM: ULTRASOUND GUIDANCE FOR VASCULAR ACCESS SELECTIVE VISCERAL ARTERIOGRAM COIL EMBOLIZATION SPLENIC ARTERY MEDICATIONS: none indicated ANESTHESIA/SEDATION: Intravenous Fentanyl 126mcg and Versed 3mg  were administered as conscious sedation during continuous monitoring of the patient's level of consciousness and physiological / cardiorespiratory status by the radiology RN, with a total moderate sedation time of 29 minutes. CONTRAST:  40 mL Omnipaque 300 IA PROCEDURE: Informed consent was  obtained from the patient following explanation of the procedure, risks, benefits and alternatives. The patient understands, agrees and consents for the procedure. All questions were addressed. A time out was performed prior to the initiation of the procedure. Maximal barrier sterile technique utilized including caps, mask, sterile gowns, sterile gloves, large sterile drape, hand hygiene, and chlorhexidine prep. Patency of right common femoral artery was confirmed with ultrasound  and documentation stored. Skin entry site infiltrated with 1% lidocaine. Under real-time ultrasound guidance, the vessel was accessed with a 21-gauge micropuncture needle, exchanged over a 018 guidewire for a transitional dilator, through which a 035 guidewire was advanced. Over this, a 5 [place] vascular sheath placed. Through this, a 5 French C2 catheter advanced in the celiac axis selected. Celiac arteriography was performed. Tortuous widely patent splenic artery identified. Using an angled Glidewire, the catheter was advanced into the mid splenic artery. Confirmatory selective arteriography was performed. There is distortion of splenic intraparenchymal branches secondary to large subcapsular hematoma. No active extravasation. Through the angiographic catheter, a Renegade microcatheter was advanced into the distal splenic artery. After confirmatory arteriography, the vessel was occluded with two 8 mm interlock coils. Follow-up arteriography demonstrates stasis of flow in the main splenic artery. Catheters removed. After confirmatory femoral arteriogram, sheath removed and hemostasis achieved using Exoseal device. The patient tolerated the procedure well. FLUOROSCOPY TIME:  7 minutes 18 seconds; 397 mGy COMPLICATIONS: None immediate. IMPRESSION: 1. No overt active extravasation from splenic artery branches. 2. Technically successful coil embolization of  splenic artery. Electronically Signed   By: Lucrezia Europe M.D.   On: 08/01/2018 08:04    Ir Angiogram Selective Each Additional Vessel  Result Date: 08/01/2018 INDICATION: Splenic rupture with large subcapsular hematoma. Prophylactic embolization requested. EXAM: ULTRASOUND GUIDANCE FOR VASCULAR ACCESS SELECTIVE VISCERAL ARTERIOGRAM COIL EMBOLIZATION SPLENIC ARTERY MEDICATIONS: none indicated ANESTHESIA/SEDATION: Intravenous Fentanyl 163mcg and Versed 3mg  were administered as conscious sedation during continuous monitoring of the patient's level of consciousness and physiological / cardiorespiratory status by the radiology RN, with a total moderate sedation time of 29 minutes. CONTRAST:  40 mL Omnipaque 300 IA PROCEDURE: Informed consent was obtained from the patient following explanation of the procedure, risks, benefits and alternatives. The patient understands, agrees and consents for the procedure. All questions were addressed. A time out was performed prior to the initiation of the procedure. Maximal barrier sterile technique utilized including caps, mask, sterile gowns, sterile gloves, large sterile drape, hand hygiene, and chlorhexidine prep. Patency of right common femoral artery was confirmed with ultrasound and documentation stored. Skin entry site infiltrated with 1% lidocaine. Under real-time ultrasound guidance, the vessel was accessed with a 21-gauge micropuncture needle, exchanged over a 018 guidewire for a transitional dilator, through which a 035 guidewire was advanced. Over this, a 5 [place] vascular sheath placed. Through this, a 5 French C2 catheter advanced in the celiac axis selected. Celiac arteriography was performed. Tortuous widely patent splenic artery identified. Using an angled Glidewire, the catheter was advanced into the mid splenic artery. Confirmatory selective arteriography was performed. There is distortion of splenic intraparenchymal branches secondary to large subcapsular hematoma. No active extravasation. Through the angiographic catheter, a Renegade  microcatheter was advanced into the distal splenic artery. After confirmatory arteriography, the vessel was occluded with two 8 mm interlock coils. Follow-up arteriography demonstrates stasis of flow in the main splenic artery. Catheters removed. After confirmatory femoral arteriogram, sheath removed and hemostasis achieved using Exoseal device. The patient tolerated the procedure well. FLUOROSCOPY TIME:  7 minutes 18 seconds; 673 mGy COMPLICATIONS: None immediate. IMPRESSION: 1. No overt active extravasation from splenic artery branches. 2. Technically successful coil embolization of  splenic artery. Electronically Signed   By: Lucrezia Europe M.D.   On: 08/01/2018 08:04   Nm Pet Image Initial (pi) Skull Base To Thigh  Result Date: 07/31/2018 CLINICAL DATA:  Initial treatment strategy for ovarian carcinoma. EXAM: NUCLEAR MEDICINE PET SKULL BASE  TO THIGH TECHNIQUE: 8.16 mCi F-18 FDG was injected intravenously. Full-ring PET imaging was performed from the skull base to thigh after the radiotracer. CT data was obtained and used for attenuation correction and anatomic localization. Fasting blood glucose: 102 mg/dl COMPARISON:  CT 07/10/2018 FINDINGS: Mediastinal blood pool activity: SUV max 2.94 Liver activity: SUV max 4 NECK: No hypermetabolic lymph nodes in the neck. Incidental CT findings: none CHEST: Rind of hypermetabolic activity along the medial mediastinum pleural surface of the LEFT lung consistent with talc pleurodesis. There is loculated lung at the LEFT lung base with a rim of hypermetabolic activity also related to pleurodesis. No hypermetabolic mediastinal lymph nodes. Mild metabolic activity associated the mid esophagus (image 71) Incidental CT findings: No suspicious pulmonary nodules. PleurX catheter in place. ABDOMEN/PELVIS: There is hypermetabolic tissue associated with the enlarged ovaries. The hypermetabolic portion corresponds to the thickened soft tissue component along the walls of the cystic  ovarian masses. The RIGHT ovarian mass measures 5.8 Cm with SUV max equal 6.7 along the soft tissue wall thickening portion. The cystic portion is non metabolic. The LEFT ovary is smaller measuring 4.3 cm with hypermetabolic tissue medially similar to the RIGHT. There is a rim of hypermetabolic activity along the inferior margin of the RIGHT hepatic lobe which corresponds to subtle peritoneal thickening. This metabolic activity is above background liver activity with SUV max equal 5.0 compared to 3.0 in the background liver. A similar hypermetabolic peritoneal thickening in the LEFT ventral peritoneal surface with SUV max equal 3.4 on image 112. Mild hypermetabolic nodularity in the greater omentum with SUV max equal 3.0 on image 113. Incidental CT findings: There is a large splenic hematoma superior to the spleen confined superiorly by the LEFT hemidiaphragm. This large round hematoma measures 10.5 x 11.3 by 9.0 cm. Within the hematoma is bounded inferiorly by the spleen superiorly by the diaphragm as seen on coronal image 67/126 and PET-CT image 131. SKELETON: No focal hypermetabolic activity to suggest skeletal metastasis. Incidental CT findings: none IMPRESSION: 1. Hypermetabolic tissue associated with cystic masses of the LEFT RIGHT ovary consistent with ovarian carcinoma. 2. Evidence of carcinoma metastasis with rind of hypermetabolic tissue along the inferior margin of liver RIGHT hepatic lobe as well as on along the ventral LEFT peritoneal space and greater omentum. 3. Hypermetabolic tissue in the LEFT pleural space is related to talc pleurodesis. Cannot exclude underlying malignancy. 4. Incidental finding of a large hematoma in LEFT upper quadrant bound superiorly by the LEFT hemidiaphragm and inferiorly by the spleen. Recommend CTA of the abdomen to evaluate for persistent hemorrhage from the spleen and surgical consultation. Findings conveyed toSREEDHAR KATRAGADDA on 07/31/2018  at13:20. Electronically  Signed   By: Suzy Bouchard M.D.   On: 07/31/2018 13:31   Ir US Guide Vasc Access Right  Result Date: 08/01/2018 INDICATION: Splenic rupture with large subcapsular hematoma. Prophylactic embolization requested. EXAM: ULTRASOUND GUIDANCE FOR VASCULAR ACCESS SELECTIVE VISCERAL ARTERIOGRAM COIL EMBOLIZATION SPLENIC ARTERY MEDICATIONS: none indicated ANESTHESIA/SEDATION: Intravenous Fentanyl 122mcg and Versed 3mg  were administered as conscious sedation during continuous monitoring of the patient's level of consciousness and physiological / cardiorespiratory status by the radiology RN, with a total moderate sedation time of 29 minutes. CONTRAST:  40 mL Omnipaque 300 IA PROCEDURE: Informed consent was obtained from the patient following explanation of the procedure, risks, benefits and alternatives. The patient understands, agrees and consents for the procedure. All questions were addressed. A time out was performed prior to the initiation of the procedure. Maximal barrier  sterile technique utilized including caps, mask, sterile gowns, sterile gloves, large sterile drape, hand hygiene, and chlorhexidine prep. Patency of right common femoral artery was confirmed with ultrasound and documentation stored. Skin entry site infiltrated with 1% lidocaine. Under real-time ultrasound guidance, the vessel was accessed with a 21-gauge micropuncture needle, exchanged over a 018 guidewire for a transitional dilator, through which a 035 guidewire was advanced. Over this, a 5 [place] vascular sheath placed. Through this, a 5 French C2 catheter advanced in the celiac axis selected. Celiac arteriography was performed. Tortuous widely patent splenic artery identified. Using an angled Glidewire, the catheter was advanced into the mid splenic artery. Confirmatory selective arteriography was performed. There is distortion of splenic intraparenchymal branches secondary to large subcapsular hematoma. No active extravasation. Through the  angiographic catheter, a Renegade microcatheter was advanced into the distal splenic artery. After confirmatory arteriography, the vessel was occluded with two 8 mm interlock coils. Follow-up arteriography demonstrates stasis of flow in the main splenic artery. Catheters removed. After confirmatory femoral arteriogram, sheath removed and hemostasis achieved using Exoseal device. The patient tolerated the procedure well. FLUOROSCOPY TIME:  7 minutes 18 seconds; 779 mGy COMPLICATIONS: None immediate. IMPRESSION: 1. No overt active extravasation from splenic artery branches. 2. Technically successful coil embolization of  splenic artery. Electronically Signed   By: Lucrezia Europe M.D.   On: 08/01/2018 08:04   Dg Chest Port 1 View  Result Date: 07/31/2018 CLINICAL DATA:  PleurX, effusion, suspected intra-abdominal hemorrhage on PET-CT EXAM: PORTABLE CHEST 1 VIEW COMPARISON:  Same day PET-CT, chest radiograph, 07/18/2018 FINDINGS: No significant change in chest radiograph with a moderate left pleural effusion and associated atelectasis or consolidation, with a tunneled left-sided chest catheter in position about the posterior pleural space. The right lung is normally aerated. Cardiomegaly. IMPRESSION: No significant change in chest radiograph with a moderate left pleural effusion and associated atelectasis or consolidation, with a tunneled left-sided chest catheter in position about the posterior pleural space. The right lung is normally aerated. Cardiomegaly. Electronically Signed   By: Eddie Candle M.D.   On: 07/31/2018 15:37   [place] Guide Roadmapping  Result Date: 08/01/2018 INDICATION: Splenic rupture with large subcapsular hematoma. Prophylactic embolization requested. EXAM: ULTRASOUND GUIDANCE FOR VASCULAR ACCESS SELECTIVE VISCERAL ARTERIOGRAM COIL EMBOLIZATION SPLENIC ARTERY MEDICATIONS: none indicated ANESTHESIA/SEDATION: Intravenous Fentanyl 145mcg and Versed 3mg  were  administered as conscious sedation during continuous monitoring of the patient's level of consciousness and physiological / cardiorespiratory status by the radiology RN, with a total moderate sedation time of 29 minutes. CONTRAST:  40 mL Omnipaque 300 IA PROCEDURE: Informed consent was obtained from the patient following explanation of the procedure, risks, benefits and alternatives. The patient understands, agrees and consents for the procedure. All questions were addressed. A time out was performed prior to the initiation of the procedure. Maximal barrier sterile technique utilized including caps, mask, sterile gowns, sterile gloves, large sterile drape, hand hygiene, and chlorhexidine prep. Patency of right common femoral artery was confirmed with ultrasound and documentation stored. Skin entry site infiltrated with 1% lidocaine. Under real-time ultrasound guidance, the vessel was accessed with a 21-gauge micropuncture needle, exchanged over a 018 guidewire for a transitional dilator, through which a 035 guidewire was advanced. Over this, a 5 [place] vascular sheath placed. Through this, a 5 French C2 catheter advanced in the celiac axis selected. Celiac arteriography was performed. Tortuous widely patent splenic artery identified. Using an angled Glidewire, the catheter was advanced  into the mid splenic artery. Confirmatory selective arteriography was performed. There is distortion of splenic intraparenchymal branches secondary to large subcapsular hematoma. No active extravasation. Through the angiographic catheter, a Renegade microcatheter was advanced into the distal splenic artery. After confirmatory arteriography, the vessel was occluded with two 8 mm interlock coils. Follow-up arteriography demonstrates stasis of flow in the main splenic artery. Catheters removed. After confirmatory femoral arteriogram, sheath removed and hemostasis achieved using Exoseal device. The patient tolerated the procedure well.  FLUOROSCOPY TIME:  7 minutes 18 seconds; 101 mGy COMPLICATIONS: None immediate. IMPRESSION: 1. No overt active extravasation from splenic artery branches. 2. Technically successful coil embolization of  splenic artery. Electronically Signed   By: Lucrezia Europe M.D.   On: 08/01/2018 08:04   Ct Angio Abd/pel W And/or Wo Contrast  Result Date: 07/31/2018 CLINICAL DATA:  Operative complication of a Pleurx catheter placement. EXAM: CT ANGIOGRAPHY CHEST, ABDOMEN AND PELVIS TECHNIQUE: Multidetector CT imaging through the chest, abdomen and pelvis was performed using the standard protocol during bolus administration of intravenous contrast. Multiplanar reconstructed images and MIPs were obtained and reviewed to evaluate the vascular anatomy. CONTRAST:  169mL OMNIPAQUE IOHEXOL 350 MG/ML SOLN COMPARISON:  Same day PET-CT FINDINGS: CTA CHEST FINDINGS Cardiovascular: Preferential opacification of the thoracic aorta. Evaluation of the aortic root limited due to cardiac pulsation artifact. No convincing evidence of thoracic aortic aneurysm or dissection. Normal heart size. No pericardial effusion. Mediastinum/Nodes: Several reactive but not pathologically enlarged mediastinal and left hilar nodes are present, likely reactive to the adjacent inflammatory features given absence of uptake on PET-CT. Similarly a subcentimeter hypoattenuating nodule in the left lobe thyroid without uptake on comparison PET images is likely benign. There is nonspecific thickening of the mid esophagus and a moderate hiatal hernia. The airways are diffusely thickened with scattered secretions. There is extension of a large perisplenic hematoma through the diaphragmatic hiatus and into the lower posterior mediastinum. Lungs/Pleura: Hypoventilatory changes are present along with dependent atelectasis posteriorly. Nodular, rind-like pleural thickening throughout the left hemithorax and within the fissures is similar to PET-CT. No suspicious nodules. A  small, loculated left pleural effusion is again seen. Left Pleurx catheter in place terminating along the left mediastinal border directed apically. Musculoskeletal: No chest wall mass or suspicious bone lesions identified. Review of the MIP images confirms the above findings. CTA ABDOMEN AND PELVIS FINDINGS VASCULAR Aorta: The aorta is normal caliber. No intramural hematoma, dissection flap or other luminal abnormality of the aorta is seen. No periaortic stranding or hemorrhage. Minimal atheromatous plaque near the aortic bifurcation. No aneurysm or ectasia. Celiac: Patent without evidence of aneurysm, dissection, vasculitis or significant stenosis. SMA: Patent without evidence of aneurysm, dissection, vasculitis or significant stenosis. Renals: Both renal arteries are patent without evidence of aneurysm, dissection, vasculitis, fibromuscular dysplasia or significant stenosis. IMA: Patent without evidence of aneurysm, dissection, vasculitis or significant stenosis. Inflow: Patent without evidence of aneurysm, dissection, vasculitis or significant stenosis. Veins: No obvious venous abnormality within the limitations of this arterial phase study. Review of the MIP images confirms the above findings. NON-VASCULAR Hepatobiliary: No focal liver abnormality or hepatic injury is seen. Small amount of perihepatic hemorrhage likely tracking from the site of splenic injury. Patient is post cholecystectomy. Slight prominence of the biliary tree likely related to reservoir effect. No calcified intraductal gallstones. Pancreas: Unremarkable. No pancreatic ductal dilatation or surrounding inflammatory changes. Spleen: There is a 3.9 cm splenic laceration with a faint central hyperattenuating focus which remains stable on precontrast images from  PET-CT as well as post-contrast venous phase and delayed images. There is surrounding mixed attenuation hemorrhage. This capsular hemorrhage partially protrudes into the diaphragmatic  hiatus (axial series 12, image 7). Extracapsular hemorrhage is present as well, partially decompressing into the lesser sac along the greater curvature of the stomach. Additional hemoperitoneum is present in the right perihepatic space, both pericolic gutters and layering within the pelvis. Adrenals/Urinary Tract: No adrenal hemorrhage or renal injury identified. Bladder is unremarkable. Stomach/Bowel: Moderate hiatal hernia. Fluid collection along the greater curvature of the Lymphatic: No enlarged abdominopelvic lymph nodes. Reproductive: Bilateral thick-walled cystic lesions within both enlarged ovaries compatible with patient's known ovarian neoplasms. Anteverted uterus. Other: Hemoperitoneum, as detailed splenic section above. No free gas. Extensive omental caking and thick walled enhancement of the anterior peritoneal surfaces. Musculoskeletal: Diffuse body wall edema. No acute osseous abnormality or suspicious osseous lesion. Postsurgical changes from PleurX catheter placement with stranding in the left anterior flank. Catheter enters at the anterior left sixth interspace. Review of the MIP images confirms the above findings. IMPRESSION: 3.9 cm splenic laceration with central hyperattenuation concerning for contained vascular injury with capsular hematoma extending over greater than 80% of the splenic surface and with intraperitoneal extension of hemorrhage. Findings are compatible with a AAST grade V splenic injury. Urgent surgical consultation is warranted. PleurX catheter in the left 6th interspace, anterior axillary line and terminates along the mediastinal border directed apically. Redemonstration of the complex thick-walled cystic lesions in both adnexa, compatible with known ovarian neoplasm. Extensive omental caking is present as well as thickening along the anterior liver. Rind like thickening of the pleural surfaces within the left hemithorax and associated volume loss with trace effusion. Could  reflect postprocedural changes from prior pleurodesis though and malignancy is not excluded. These results were called by telephone at the time of interpretation on 07/31/2018 at 6:15 pm to Dr. Francine Graven, who verbally acknowledged these results. Electronically Signed   By: Lovena Le M.D.   On: 07/31/2018 18:15    Anti-infectives: Anti-infectives (From admission, onward)   None      Assessment/Plan: s/p * No surgery found * Embolization 7/18. Hemodynamically stable, hgb stable Will need post-splenectomy vaccines prior to discharge Continue w/u for metastatic ovarian cancer Will follow - would not discharge her yet  LOS: 2 days   Ileana Roup 08/02/2018

## 2018-08-02 NOTE — Evaluation (Addendum)
Physical Therapy Evaluation Patient Details Name: Michelle Holt MRN: [id number] DOB: December 09, 1970 Today's Date: 08/02/2018   History of Present Illness  48 y.o. female with a history of anxiety depression, and insomnia. Pt admitted on 07/06/2018 for dyspnea and found to have left sided pleural effusion with compressive atelectasis and mediastinal shift and underwent IR thoracentesis and pigtail cathter placement 07/08/2018 and then subsequently VATS on 07/13/2018 w PleurX catheter.  Pt admitted on 07/18/2018 for symptomatic anemia with Hgb 4. EGD 07/20/2018-> multiple distal esophageal rings and a non-bleeding cratering gastric ulcer near the prepyloric area. Pt received transfusion and Hgb improved to 10.4 upon discharge.  Pt apparently had PET scan for evaluation of bilateral ovarian mass (CA 125 581) and sent to ER for splenic laceration.  Hgb 12.0  .  Pt notes has had LUQ pain but this has not increased. Admitted for splenic laceration.   Clinical Impression  PTA pt was ambulating in home without AD, and was independent in ADLs. Pt friends and family were assisting with iADLs. Pt is currently limited in safe mobility by L UQ pain and decreased strength and endurance. Pt is independent for bed mobility, mod I for tranfers and supervision for ambulation without RW. Pt is not quite at her baseline level of function so PT will continue to follow acutely. Pt will not need any further PT services or equipment at d/c.    Follow Up Recommendations No PT follow up    Equipment Recommendations  None recommended by PT    Recommendations for Other Services       Precautions / Restrictions Precautions Precautions: None Restrictions Weight Bearing Restrictions: No      Mobility  Bed Mobility Overal bed mobility: Independent                Transfers Overall transfer level: Needs assistance Equipment used: None Transfers: Sit to/from Stand Sit to Stand: Modified independent (Device/Increase  time)         General transfer comment: increased effort, holding L flank  Ambulation/Gait Ambulation/Gait assistance: Supervision Gait Distance (Feet): 600 Feet Assistive device: None Gait Pattern/deviations: WFL(Within Functional Limits) Gait velocity: decreased Gait velocity interpretation: <1.8 ft/sec, indicate of risk for recurrent falls General Gait Details: increased effort as ambulation progression with 2/4 DoE         Balance Overall balance assessment: Mild deficits observed, not formally tested                                           Pertinent Vitals/Pain Pain Assessment: Faces Faces Pain Scale: Hurts little more Pain Location: left LQ/flank Pain Descriptors / Indicators: Sore;Aching Pain Intervention(s): Limited activity within patient's tolerance;Monitored during session;Repositioned    Home Living Family/patient expects to be discharged to:: Private residence Living Arrangements: Spouse/significant other Available Help at Discharge: Family;Available PRN/intermittently Type of Home: Apartment Home Access: Stairs to enter Entrance Stairs-Rails: Right Entrance Stairs-Number of Steps: 4 Home Layout: One level Home Equipment: Bedside commode      Prior Function Level of Independence: Independent         Comments: community ambulator      Journalist, newspaper        Extremity/Trunk Assessment   Upper Extremity Assessment Upper Extremity Assessment: Overall WFL for tasks assessed    Lower Extremity Assessment Lower Extremity Assessment: Overall WFL for tasks assessed    Cervical / Trunk Assessment  Cervical / Trunk Assessment: Normal  Communication   Communication: No difficulties  Cognition Arousal/Alertness: Awake/alert Behavior During Therapy: WFL for tasks assessed/performed Overall Cognitive Status: Within Functional Limits for tasks assessed                                        General Comments  General comments (skin integrity, edema, etc.): Pt with pleurX catheter in L lung and wants it removed before she discharges from hospital         Assessment/Plan    PT Assessment Patient needs continued PT services  PT Problem List Decreased activity tolerance;Pain       PT Treatment Interventions Gait training;Functional mobility training;Therapeutic activities;Therapeutic exercise;Balance training;Patient/family education    PT Goals (Current goals can be found in the Care Plan section)  Acute Rehab PT Goals Patient Stated Goal: go home without catheter PT Goal Formulation: With patient Time For Goal Achievement: 08/16/18 Potential to Achieve Goals: Good    Frequency Min 3X/week    AM-PAC PT "6 Clicks" Mobility  Outcome Measure Help needed turning from your back to your side while in a flat bed without using bedrails?: None Help needed moving from lying on your back to sitting on the side of a flat bed without using bedrails?: None Help needed moving to and from a bed to a chair (including a wheelchair)?: None Help needed standing up from a chair using your arms (e.g., wheelchair or bedside chair)?: None Help needed to walk in hospital room?: None Help needed climbing 3-5 steps with a railing? : A Little 6 Click Score: 23    End of Session Equipment Utilized During Treatment: Gait belt Activity Tolerance: Patient tolerated treatment well;Patient limited by fatigue Patient left: in bed;with call bell/phone within reach Nurse Communication: Mobility status PT Visit Diagnosis: Unsteadiness on feet (R26.81);Other abnormalities of gait and mobility (R26.89);Difficulty in walking, not elsewhere classified (R26.2)    Time: 7482-7078 PT Time Calculation (min) (ACUTE ONLY): 17 min   Charges:   PT Evaluation $PT Eval Moderate Complexity: 1 Mod          Aadarsh Cozort B. Migdalia Dk PT, DPT Acute Rehabilitation Services Pager [phone] Office [phone]   [place] 08/02/2018, 4:49 PM

## 2018-08-02 NOTE — Progress Notes (Signed)
PROGRESS NOTE    Michelle Holt  QMV:[id number] DOB: Dec 27, 1970 DOA: 07/31/2018 PCP: [name], MD    Brief Narrative:  Patient is a 48 year old Caucasian female with history of recent malignant left pleural effusion revealing adenocarcinoma of gynecological origin, status post VATS, pleurodesis and chest tube placement.  Patient has also been found to have significant anemia, with hemoglobin of around 4 g/dL, duodenal ulcer and esophagitis.  Patient was admitted with splenic laceration.  Patient is currently being worked up for advanced stage ovarian cancer including peritoneal carcinomatosis, and possible malignant effusion.  Apparently, patient underwent outpatient work-up with a PET scan for bilateral ovarian masses, she was found to have a splenic laceration.  On her initial physical examination blood pressure 118/58, pulse rate 86, temperature 97.4, respirate 22, her lungs are clear to auscultation bilaterally, heart S1-S2 present and rhythmic, the abdomen was soft nontender, no lower extremity edema.  Sodium 136, potassium 3.8, chloride 101, bicarb 25, glucose 95, BUN 9, creatinine 0.63, white count 7.2, hemoglobin 12.0, hematocrit 38.5, platelets 699.  SARS COVID-19 negative.  Her chest radiograph had a moderate left pleural effusion, pleural catheter in place.    CT chest and abdomen revealed 3.9 cm splenic laceration with central hypoattenuation concerning for continued vascular injury with capsular hematoma extending over greater than 80% of the splenic surface with intraperitoneal extension of hemorrhage consistent with grade 5 splenic injury. Patient was admitted to the hospital working diagnosis of splenic injury grade 5, with large splenic hematoma.  Patient underwent splenic arteriogram and coil embolization on 08/01/2018.  08/02/2018: Patient seen.  No new complaints.  Patient was the left Pleurx catheter out.  Kindly liaise with the cardiothoracic team in the morning.  Abdominal pain  has improved significantly.  Hemoglobin is stable.  Assessment & Plan:   Principal Problem:   Splenic laceration Active Problems:   Tobacco abuse   Ovarian cancer, bilateral (HCC)   1. Splenic injury grade 5 and large splenic hematoma: Patient is status post splenic arteriogram and coil embolization.   Hemoglobin is stable.   Input from general surgery and interventional radiology team is appreciated.   Likely, patient will eventually become asplenic.   Consider immunization for asplenia as per protocol.   Further management will depend on hospital course.    2. Left pleural effusion:  Status post chest tube placement.   No further drainage from the chest tube as per patient. Consider cardiothoracic inpatient consultation in a.m.   3. Constipation. Will add bid miralax and rectal hydrocortisone for hemorrhoids.   4. Anxiety. Continue as needed diazepam.  5. Suspected ovarian cancer. Will need to continue outpatient workup.   DVT prophylaxis: scd Code Status:  full Family Communication:  Disposition Plan/ discharge barriers: This will depend on hospital course.   Body mass index is 25.76 kg/m. Malnutrition Type:  Consultants:   General surgery  Interventional radiology  Consider cardiothoracic team consultation in a.m. (08/03/2018)  Procedures:   Spleen, coil embolization   Antimicrobials:   None   Subjective: Abdominal pain has improved. No fever or chills No shortness of breath No chest pain  Objective: Vitals:   08/01/18 1953 08/02/18 0446 08/02/18 0449 08/02/18 1238  BP: (!) 105/59 (!) 97/50    Pulse:  85    Resp: 17 15    Temp: 99.7 F (37.6 C) 98.8 F (37.1 C)  98.7 F (37.1 C)  TempSrc: Oral Oral  Oral  SpO2: 90% 94%    Weight:  72.4 kg   Height:        Intake/Output Summary (Last 24 hours) at 08/02/2018 1306 Last data filed at 08/02/2018 0840 Gross per 24 hour  Intake 240 ml  Output 1800 ml  Net -1560 ml   Filed Weights    07/31/18 2250 08/01/18 0353 08/02/18 0449  Weight: 72.5 kg 72.5 kg 72.4 kg    Examination:   General: Not in any distress. Neurology: Awake and alert, patient moves all extremities. E ENT: Mild pallor.  No jaundice. Cardiovascular: S1-S2. Pulmonary: Clear to auscultation.  Left chest tube in place.  Gastrointestinal. Abdomen soft, with very minimal discomfort left abdominal area.  Organs are difficult to assess.   Musculoskeletal: No leg edema.    Data Reviewed: I have personally reviewed following labs and imaging studies  CBC: Recent Labs  Lab 07/31/18 1500 08/01/18 1212 08/02/18 1019  WBC 7.2 8.8 10.0  NEUTROABS 4.7 5.8 7.1  HGB 12.0 11.6* 12.6  HCT 38.5 36.7 38.8  MCV 95.3 95.3 93.7  PLT 699* 667* 979   Basic Metabolic Panel: Recent Labs  Lab 07/31/18 1500 08/02/18 1019  NA 136 134*  K 3.8 4.8  CL 101 100  CO2 25 22  GLUCOSE 95 97  BUN 9 <5*  CREATININE 0.63 0.75  CALCIUM 9.0 9.1  PHOS  --  4.3   GFR: Estimated Creatinine Clearance: 87.6 mL/min (by C-G formula based on SCr of 0.75 mg/dL). Liver Function Tests: Recent Labs  Lab 07/31/18 1500 08/02/18 1019  AST 25  --   ALT 27  --   ALKPHOS 138*  --   BILITOT 0.9  --   PROT 7.1  --   ALBUMIN 3.2* 2.7*   No results for input(s): LIPASE, AMYLASE in the last 168 hours. No results for input(s): AMMONIA in the last 168 hours. Coagulation Profile: Recent Labs  Lab 07/31/18 1500  INR 1.1   Cardiac Enzymes: No results for input(s): CKTOTAL, CKMB, CKMBINDEX, TROPONINI in the last 168 hours. BNP (last 3 results) No results for input(s): PROBNP in the last 8760 hours. HbA1C: No results for input(s): HGBA1C in the last 72 hours. CBG: Recent Labs  Lab 07/31/18 0849  GLUCAP 102*   Lipid Profile: No results for input(s): CHOL, HDL, LDLCALC, TRIG, CHOLHDL, LDLDIRECT in the last 72 hours. Thyroid Function Tests: No results for input(s): TSH, T4TOTAL, FREET4, T3FREE, THYROIDAB in the last 72 hours.  Anemia Panel: No results for input(s): VITAMINB12, FOLATE, FERRITIN, TIBC, IRON, RETICCTPCT in the last 72 hours.    Radiology Studies: I have reviewed all of the imaging during this hospital visit personally     Scheduled Meds: . docusate sodium  100 mg Oral BID  . hydrocortisone   Rectal BID  . pantoprazole  40 mg Oral BID AC  . polyethylene glycol  17 g Oral BID  . senna-docusate  1 tablet Oral QHS   Continuous Infusions:    LOS: 2 days    Bonnell Public, MD

## 2018-08-03 ENCOUNTER — Ambulatory Visit (HOSPITAL_COMMUNITY): Payer: Self-pay

## 2018-08-03 ENCOUNTER — Encounter (HOSPITAL_COMMUNITY): Payer: Self-pay | Admitting: *Deleted

## 2018-08-03 ENCOUNTER — Ambulatory Visit (HOSPITAL_COMMUNITY): Payer: Self-pay | Admitting: Hematology

## 2018-08-03 DIAGNOSIS — J910 Malignant pleural effusion: Secondary | ICD-10-CM

## 2018-08-03 DIAGNOSIS — S36039D Unspecified laceration of spleen, subsequent encounter: Secondary | ICD-10-CM

## 2018-08-03 DIAGNOSIS — D62 Acute posthemorrhagic anemia: Secondary | ICD-10-CM

## 2018-08-03 DIAGNOSIS — F4320 Adjustment disorder, unspecified: Secondary | ICD-10-CM

## 2018-08-03 MED ORDER — PNEUMOCOCCAL 13-VAL CONJ VACC IM SUSP
0.5000 mL | INTRAMUSCULAR | Status: AC
  Administered 2018-08-04: 0.5 mL via INTRAMUSCULAR
  Filled 2018-08-03: qty 0.5

## 2018-08-03 MED ORDER — MENINGOCOCCAL A C Y&W-135 OLIG IM SOLR
0.5000 mL | Freq: Once | INTRAMUSCULAR | Status: AC
  Administered 2018-08-04: 0.5 mL via INTRAMUSCULAR
  Filled 2018-08-03: qty 0.5

## 2018-08-03 MED ORDER — HAEMOPHILUS B POLYSAC CONJ VAC 10 MCG IJ SOLR
0.5000 mL | Freq: Once | INTRAMUSCULAR | Status: AC
  Administered 2018-08-04: 0.5 mL via INTRAMUSCULAR
  Filled 2018-08-03: qty 0.5

## 2018-08-03 MED ORDER — GABAPENTIN 300 MG PO CAPS
300.0000 mg | ORAL_CAPSULE | Freq: Three times a day (TID) | ORAL | Status: DC
  Administered 2018-08-03 – 2018-08-04 (×5): 300 mg via ORAL
  Filled 2018-08-03 (×5): qty 1

## 2018-08-03 NOTE — Progress Notes (Signed)
  Subjective: Blood pressure stable, hemoglobin stable Patient complaining of postthoracotomy pain radiating under left breast line-will start on oral Neurontin Last chest x-ray shows evidence of left pleural effusion- drainage of left Pleurx catheter ordered. We will continue to follow the patient in the office for left Pleurx catheter, postthoracotomy pain.  Appointment set up later this week.  Objective: Vital signs in last 24 hours: Temp:  [98 F (36.7 C)-98.8 F (37.1 C)] 98 F (36.7 C) (07/20 0629) Pulse Rate:  [81-95] 81 (07/20 0629) Cardiac Rhythm: Normal sinus rhythm;Bundle branch block (07/20 0726) Resp:  [12-17] 12 (07/20 0629) BP: (94-114)/(53-60) 114/60 (07/20 0629) SpO2:  [89 %-94 %] 89 % (07/20 0629) Weight:  [71.9 kg] 71.9 kg (07/20 0629)  Hemodynamic parameters for last 24 hours:    Intake/Output from previous day: 07/19 0701 - 07/20 0700 In: 480 [P.O.:480] Out: 2400 [Urine:2400] Intake/Output this shift: No intake/output data recorded.  Left VATS incision clean and dry Pleurx catheter dressing intact Breath sounds slightly diminished on the left side Tolerating regular diet without nausea  Lab Results: Recent Labs    08/01/18 1212 08/02/18 1019  WBC 8.8 10.0  HGB 11.6* 12.6  HCT 36.7 38.8  PLT 667* 384   BMET:  Recent Labs    07/31/18 1500 08/02/18 1019  NA 136 134*  K 3.8 4.8  CL 101 100  CO2 25 22  GLUCOSE 95 97  BUN 9 <5*  CREATININE 0.63 0.75  CALCIUM 9.0 9.1    PT/INR:  Recent Labs    07/31/18 1500  LABPROT 13.7  INR 1.1   ABG    Component Value Date/Time   PHART 7.400 07/14/2018 0438   HCO3 26.9 07/14/2018 0438   TCO2 28 07/14/2018 0438   O2SAT 94.0 07/14/2018 0438   CBG (last 3)  No results for input(s): GLUCAP in the last 72 hours.  Assessment/Plan: S/P Left VATS, biopsy, placement of Pleurx catheter Drain Pleurx catheter today while in hospital Office follow-up for thoracic surgical issues is been  arranged. Discharge on oral Neurontin 300 mg p.o. 3 times daily for postthoracotomy pain.    LOS: 3 days    Michelle Holt 08/03/2018

## 2018-08-03 NOTE — Progress Notes (Signed)
Patient called me today from her inpatient room.  She was told that she had some bleeding that was noted on her PET scan but she was not given all the results.  She was supposed to come today for those results but due to admission it was cancelled.  I reviewed the written impression with her. I explained that Dr. Delton Coombes would go over the results in more detail with her and present treatment options to her.  We will follow her admission and will reschedule her once she is discharged.  She verbalizes understanding.

## 2018-08-03 NOTE — Progress Notes (Signed)
PROGRESS NOTE  Michelle Holt NGE:[id number] DOB: Jul 11, 1970   PCP: [name], MD  Patient is from: Home  DOA: 07/31/2018 LOS: 3  Brief Narrative / Interim history: 48 year old female with history of malignant left pleural effusion from adenocarcinoma of gynecologic origin s/p VATS, pleurodesis and chest tube placement presenting with anemia with hemoglobin to 4 g and found to have splenic laceration as noted on CT chest/abdomen.  She underwent splenic arteriogram and coil embolization on 08/01/2018. She received about 5 units of packed red blood cell with appropriate response.  Hemoglobin remained stable.  Subjective: No major events overnight of this morning.  Complains about difficulty sleeping and pain over left upper quadrant.  Likes to have the chest tube and stitch removed.  Denies shortness of breath, nausea, vomiting.  Last bowel movement yesterday.  Denies GU symptoms.  Objective: Vitals:   08/02/18 1238 08/02/18 2024 08/03/18 0629 08/03/18 1256  BP:  (!) 94/53 114/60 (!) 96/55  Pulse:  95 81   Resp:  17 12 19   Temp: 98.7 F (37.1 C) 98.8 F (37.1 C) 98 F (36.7 C) 98.6 F (37 C)  TempSrc: Oral Oral Oral Oral  SpO2:  94% (!) 89% 92%  Weight:   71.9 kg   Height:        Intake/Output Summary (Last 24 hours) at 08/03/2018 1335 Last data filed at 08/03/2018 1001 Gross per 24 hour  Intake 180 ml  Output 601 ml  Net -421 ml   Filed Weights   08/01/18 0353 08/02/18 0449 08/03/18 0629  Weight: 72.5 kg 72.4 kg 71.9 kg    Examination:  GENERAL: No acute distress.  Appears well.  HEENT: MMM.  Vision and hearing grossly intact.  NECK: Supple.  No apparent JVD.  RESP:  No IWOB. Good air movement bilaterally. CVS:  RRR. Heart sounds normal.  ABD/GI/GU: Bowel sounds present. Soft.  Tenderness to palpation over LUQ.  Chest tube over the left chest laterally. MSK/EXT:  Moves extremities. No apparent deformity or edema.  SKIN: no apparent skin lesion or wound NEURO:  Awake, alert and oriented appropriately.  No gross deficit.  PSYCH: Flat affect.  I have personally reviewed the following labs and images:  Radiology Studies: No results found.  Microbiology: Recent Results (from the past 240 hour(s))  SARS Coronavirus 2 (CEPHEID - Performed in [place] hospital lab), Hosp Order     Status: None   Collection Time: 07/31/18  6:45 PM   Specimen: Nasopharyngeal Swab  Result Value Ref Range Status   SARS Coronavirus 2 NEGATIVE NEGATIVE Final    Comment: (NOTE) If result is NEGATIVE SARS-CoV-2 target nucleic acids are NOT DETECTED. The SARS-CoV-2 RNA is generally detectable in upper and lower  respiratory specimens during the acute phase of infection. The lowest  concentration of SARS-CoV-2 viral copies this assay can detect is 250  copies / mL. A negative result does not preclude SARS-CoV-2 infection  and should not be used as the sole basis for treatment or other  patient management decisions.  A negative result may occur with  improper specimen collection / handling, submission of specimen other  than nasopharyngeal swab, presence of viral mutation(s) within the  areas targeted by this assay, and inadequate number of viral copies  (<250 copies / mL). A negative result must be combined with clinical  observations, patient history, and epidemiological information. If result is POSITIVE SARS-CoV-2 target nucleic acids are DETECTED. The SARS-CoV-2 RNA is generally detectable in upper and lower  respiratory specimens dur ing the acute phase of infection.  Positive  results are indicative of active infection with SARS-CoV-2.  Clinical  correlation with patient history and other diagnostic information is  necessary to determine patient infection status.  Positive results do  not rule out bacterial infection or co-infection with other viruses. If result is PRESUMPTIVE POSTIVE SARS-CoV-2 nucleic acids MAY BE PRESENT.   A presumptive positive result  was obtained on the submitted specimen  and confirmed on repeat testing.  While 2019 novel coronavirus  (SARS-CoV-2) nucleic acids may be present in the submitted sample  additional confirmatory testing may be necessary for epidemiological  and / or clinical management purposes  to differentiate between  SARS-CoV-2 and other Sarbecovirus currently known to infect humans.  If clinically indicated additional testing with an alternate test  methodology [phone]) is advised. The SARS-CoV-2 RNA is generally  detectable in upper and lower respiratory sp ecimens during the acute  phase of infection. The expected result is Negative. Fact Sheet for Patients:  StrictlyIdeas.no Fact Sheet for Healthcare Providers: BankingDealers.co.za This test is not yet approved or cleared by the [place] FDA and has been authorized for detection and/or diagnosis of SARS-CoV-2 by FDA under an Emergency Use Authorization (EUA).  This EUA will remain in effect (meaning this test can be used) for the duration of the COVID-19 declaration under Section 564(b)(1) of the Act, 21 U.S.C. section 360bbb-3(b)(1), unless the authorization is terminated or revoked sooner. Performed at [facility], [address] [place], [place] [address]     Sepsis Labs: Invalid input(s): PROCALCITONIN, LACTICIDVEN  Urine analysis:    Component Value Date/Time   COLORURINE YELLOW 07/18/2018 1803   APPEARANCEUR CLEAR 07/18/2018 1803   LABSPEC 1.011 07/18/2018 1803   PHURINE 5.0 07/18/2018 1803   GLUCOSEU NEGATIVE 07/18/2018 1803   HGBUR NEGATIVE 07/18/2018 1803   BILIRUBINUR NEGATIVE 07/18/2018 1803   KETONESUR NEGATIVE 07/18/2018 1803   PROTEINUR NEGATIVE 07/18/2018 1803   NITRITE NEGATIVE 07/18/2018 1803   LEUKOCYTESUR NEGATIVE 07/18/2018 1803    Anemia Panel: No results for input(s): VITAMINB12, FOLATE, FERRITIN, TIBC, IRON, RETICCTPCT in the last 72 hours.  Thyroid  Function Tests: No results for input(s): TSH, T4TOTAL, FREET4, T3FREE, THYROIDAB in the last 72 hours.  Lipid Profile: No results for input(s): CHOL, HDL, LDLCALC, TRIG, CHOLHDL, LDLDIRECT in the last 72 hours.  CBG: Recent Labs  Lab 07/31/18 0849  GLUCAP 102*    HbA1C: No results for input(s): HGBA1C in the last 72 hours.  BNP (last 3 results): No results for input(s): PROBNP in the last 8760 hours.  Cardiac Enzymes: No results for input(s): CKTOTAL, CKMB, CKMBINDEX, TROPONINI in the last 168 hours.  Coagulation Profile: Recent Labs  Lab 07/31/18 1500  INR 1.1    Liver Function Tests: Recent Labs  Lab 07/31/18 1500 08/02/18 1019  AST 25  --   ALT 27  --   ALKPHOS 138*  --   BILITOT 0.9  --   PROT 7.1  --   ALBUMIN 3.2* 2.7*   No results for input(s): LIPASE, AMYLASE in the last 168 hours. No results for input(s): AMMONIA in the last 168 hours.  Basic Metabolic Panel: Recent Labs  Lab 07/31/18 1500 08/02/18 1019  NA 136 134*  K 3.8 4.8  CL 101 100  CO2 25 22  GLUCOSE 95 97  BUN 9 <5*  CREATININE 0.63 0.75  CALCIUM 9.0 9.1  PHOS  --  4.3   GFR: Estimated Creatinine Clearance:  87.3 mL/min (by C-G formula based on SCr of 0.75 mg/dL).  CBC: Recent Labs  Lab 07/31/18 1500 08/01/18 1212 08/02/18 1019  WBC 7.2 8.8 10.0  NEUTROABS 4.7 5.8 7.1  HGB 12.0 11.6* 12.6  HCT 38.5 36.7 38.8  MCV 95.3 95.3 93.7  PLT 699* 667* 384    Procedures:  Splenic arteriogram and coil embolization on 7/18  Microbiology summarized: COVID-19 negative.  Assessment & Plan: Splenic laceration with large splenic hematoma -Status post splenic arteriogram and coil embolization on 08/01/2018. -H&H stable -Gave appropriate immunization for asplenia -Needs follow-up immunization after 8 weeks  Acute blood loss anemia due to splenic laceration/hematoma -Hemoglobin 4 g on admission.  Received 5 units of blood with appropriate response.  Hemoglobin stable now.  -Supplemental iron  Left malignant pleural effusion/adenocarcinoma of gynecologic origin -Appreciate CTS input -Pleural cath placement today -Outpatient follow-up with CTS and oncology.  Constipation -Continue bowel regimen  Anxiety/adjustment disorder -Continue home Valium  DVT prophylaxis: SCD Code Status: Full code Family Communication: Patient and/or RN. Available if any question.  Disposition Plan: Anticipate discharge home in the next 24 to 48 hours after pleural cath placement. Consultants: General surgery (signed off), CTS   Antimicrobials: Anti-infectives (From admission, onward)   None      Sch Meds:  Scheduled Meds: . docusate sodium  100 mg Oral BID  . gabapentin  300 mg Oral TID  . [START ON 08/04/2018] haemophilus B polysaccharide conjugate vaccine  0.5 mL Intramuscular Once  . hydrocortisone   Rectal BID  . [START ON 08/04/2018] meningococcal oligosaccharide  0.5 mL Intramuscular Once  . pantoprazole  40 mg Oral BID AC  . [START ON 08/04/2018] pneumococcal 13-valent conjugate vaccine  0.5 mL Intramuscular Tomorrow-1000  . polyethylene glycol  17 g Oral BID  . senna-docusate  1 tablet Oral QHS   Continuous Infusions: PRN Meds:.acetaminophen, diazepam, HYDROmorphone (DILAUDID) injection, ondansetron (ZOFRAN) IV, oxyCODONE   Lilley Hubble T. [place]  If 7PM-7AM, please contact night-coverage [email or web address] Password [email or web address] 08/03/2018, 1:35 PM

## 2018-08-03 NOTE — Progress Notes (Signed)
Drained pleurex catheter per order. 90ml returned. Pt states that the catheter has not draining anything for a while now. Redressed site.  Clyde Canterbury, RN

## 2018-08-03 NOTE — Plan of Care (Signed)

## 2018-08-03 NOTE — Progress Notes (Signed)
Patient ID: Michelle Holt, female   DOB: 1970/12/02, 48 y.o.   MRN: [id number]       Subjective: Mostly complains of pain from her pleur-X catheter.  Not a great appetite yet.    Objective: Vital signs in last 24 hours: Temp:  [98 F (36.7 C)-98.8 F (37.1 C)] 98 F (36.7 C) (07/20 0629) Pulse Rate:  [81-95] 81 (07/20 0629) Resp:  [12-17] 12 (07/20 0629) BP: (94-114)/(53-60) 114/60 (07/20 0629) SpO2:  [89 %-94 %] 89 % (07/20 0629) Weight:  [71.9 kg] 71.9 kg (07/20 0629) Last BM Date: 08/02/18  Intake/Output from previous day: 07/19 0701 - 07/20 0700 In: 480 [P.O.:480] Out: 2400 [Urine:2400] Intake/Output this shift: No intake/output data recorded.  PE: Heart: regular Lungs: CTAB, pleur-x catheter in place in left chest Abd: soft, appropriately tender in LUQ and some around drain, +BS, ND  Lab Results:  Recent Labs    08/01/18 1212 08/02/18 1019  WBC 8.8 10.0  HGB 11.6* 12.6  HCT 36.7 38.8  PLT 667* 384   BMET Recent Labs    07/31/18 1500 08/02/18 1019  NA 136 134*  K 3.8 4.8  CL 101 100  CO2 25 22  GLUCOSE 95 97  BUN 9 <5*  CREATININE 0.63 0.75  CALCIUM 9.0 9.1   PT/INR Recent Labs    07/31/18 1500  LABPROT 13.7  INR 1.1   CMP     Component Value Date/Time   NA 134 (L) 08/02/2018 1019   K 4.8 08/02/2018 1019   CL 100 08/02/2018 1019   CO2 22 08/02/2018 1019   GLUCOSE 97 08/02/2018 1019   BUN <5 (L) 08/02/2018 1019   CREATININE 0.75 08/02/2018 1019   CALCIUM 9.1 08/02/2018 1019   PROT 7.1 07/31/2018 1500   ALBUMIN 2.7 (L) 08/02/2018 1019   AST 25 07/31/2018 1500   ALT 27 07/31/2018 1500   ALKPHOS 138 (H) 07/31/2018 1500   BILITOT 0.9 07/31/2018 1500   GFRNONAA >60 08/02/2018 1019   GFRAA >60 08/02/2018 1019   Lipase  No results found for: LIPASE     Studies/Results: No results found.  Anti-infectives: Anti-infectives (From admission, onward)   None       Assessment/Plan  Ovarian cancer S/p pleurX catheter placement  by Dr. Darcey Nora  Splenic hematoma, s/p splenic embo by IR -hgb stable around 12 today -pain as expected -patient will need splenic vaccines prior to discharge home -no further surgical needs or interventions -patient would like to see TCTS to discuss removal of her pleurX catheter as it is causing her pain.  Will defer to medical team.  We will sign off.  FEN - regular diet VTE - on hold due to anemia ID - none   LOS: 3 days    Henreitta Cea , [facility] Surgery 08/03/2018, 8:27 AM Pager: [phone]

## 2018-08-03 NOTE — Progress Notes (Signed)
Nursing staff went in to give AM meds, pt resting in bed tearful. Pt encouraged to express emotions. Pt spoke about recent medical diagnosis. Pt given time to voice concerns. Pt wants to be left alone at this time, wants to get pleurx cath and remove suture at time. Sign placed on door to see nursing staff before entering per pt's request.

## 2018-08-04 ENCOUNTER — Inpatient Hospital Stay (HOSPITAL_COMMUNITY): Payer: Medicaid Other

## 2018-08-04 ENCOUNTER — Other Ambulatory Visit: Payer: Self-pay

## 2018-08-04 ENCOUNTER — Encounter: Payer: Self-pay | Admitting: *Deleted

## 2018-08-04 MED ORDER — GABAPENTIN 300 MG PO CAPS: 300.0000 mg | ORAL_CAPSULE | Freq: Three times a day (TID) | ORAL | 0 refills | Status: DC

## 2018-08-04 MED ORDER — LORAZEPAM 1 MG PO TABS
1.0000 mg | ORAL_TABLET | Freq: Once | ORAL | Status: AC
  Administered 2018-08-04: 1 mg via ORAL
  Filled 2018-08-04: qty 1

## 2018-08-04 MED FILL — GABAPENTIN 300 MG CAPSULE: 300 | 30 days supply | Qty: 90 | Fill #0

## 2018-08-04 NOTE — TOC Transition Note (Signed)
Transition of Care [facility]) - CM/SW Discharge Note Marvetta Gibbons RN, BSN Transitions of Care Unit 4E- RN Case Manager [phone]   Patient Details  Name: Michelle Holt MRN: [id number] Date of Birth: 10/01/1970  [facility]) CM/SW Contact:  Dawayne Patricia, RN Phone Number: 08/04/2018, 12:52 PM   Clinical Narrative:    Pt stable for transition home, pt has pleurX cath that was in place PTA. On last discharge pt was set up with [facility] for [facility] needs regarding pleurX cath - resumption order placed for f/u if needed. Pt reports that she wants cath removed and is to see Dr. Darcey Nora 7/21 regarding cath removal. Spoke with Butch Penny at [facility] regarding [place] visit if cath remains they will f/u with pt for any further needs.    Final next level of care: [place] Barriers to Discharge: No Barriers Identified   Patient Goals and CMS Choice  Home      Discharge Placement  Home with [facility]                     Discharge Plan and Services                DME Arranged: N/A DME Agency: NA       HH Arranged: RN [place] Agency: [place] ([place]) Date [place]: 08/04/18 Time [place]: 1252 Representative spoke with at [place]: Janae Sauce  Social Determinants of Health (SDOH) Interventions     Readmission Risk Interventions Readmission Risk Prevention Plan 08/04/2018  Transportation Screening Complete  PCP or Specialist Appt within 5-7 Days Complete  Home Care Screening Complete  Medication Review (RN CM) Complete  Some recent data might be hidden

## 2018-08-04 NOTE — Plan of Care (Signed)
  Problem: Clinical Measurements: Goal: Will remain free from infection Outcome: Progressing   Problem: Activity: Goal: Risk for activity intolerance will decrease Outcome: Progressing   Problem: Health Behavior/Discharge Planning: Goal: Ability to manage health-related needs will improve Outcome: Not Progressing   Problem: Clinical Measurements: Goal: Diagnostic test results will improve Outcome: Not Progressing

## 2018-08-04 NOTE — Discharge Summary (Signed)
Physician Discharge Summary  DAMA [name] DOB: [birth year] DOA: 07/31/2018  PCP: Michelle Chroman, MD  Admit date: 07/31/2018 Discharge date: 08/04/2018  Admitted From: Home Disposition: Home  Recommendations for Outpatient Follow-up:  1. Follow up with PCP in 1-2 weeks 2. Please obtain CBC/BMP/Mag at follow up 3. Please follow up on the following pending results: None  Home Health: RN Equipment/Devices: None  Discharge Condition: Stable CODE STATUS: Full code  Hospital Course: 48 year old female with history of malignant left pleural effusion from adenocarcinoma of gynecologic origin s/p VATS, pleurodesis and chest tube placement presenting with anemia with hemoglobin to 4 g and found to have splenic laceration as noted on CT chest/abdomen.  She underwent splenic arteriogram and coil embolization on 08/01/2018. She received about 5 units of packed red blood cell with appropriate response.  Hemoglobin remained stable. Received appropriate vaccinations for asplenia.  Patient with left-sided pleural effusion with Pleurx cath in place.  Reportedly not draining.  Pleural effusion stable on chest x-ray.  Attempt was made to drain the Pleurx cath but no return.  Evaluated by her cardiothoracic surgeon.  Gabapentin added and outpatient follow-up recommended.   Patient was evaluated by physical therapy, and no need was identified.  See individual problems below for more.  Discharge Diagnoses:  Splenic laceration with large splenic hematoma -Status post splenic arteriogram and coil embolization on 08/01/2018. -H&H stable -Gave appropriate immunization for asplenia -Needs follow-up immunization after 8 weeks  Acute blood loss anemia due to splenic laceration/hematoma -Hemoglobin 4 g on admission.  Received 5 units of blood with appropriate response.  Hemoglobin stable now. -Supplemental iron  Left malignant pleural effusion/adenocarcinoma of gynecologic  origin -Appreciate CTS input-gabapentin and outpatient follow-up -No return when RN attempted to drain Pleurx cath -Outpatient follow-up with CTS and oncology.  Constipation -Continue bowel regimen  Anxiety/adjustment disorder -Continue home Valium  Discharge Instructions  Discharge Instructions    Call MD for:  difficulty breathing, headache or visual disturbances   Complete by: As directed    Call MD for:  extreme fatigue   Complete by: As directed    Call MD for:  persistant dizziness or light-headedness   Complete by: As directed    Call MD for:  persistant nausea and vomiting   Complete by: As directed    Call MD for:  redness, tenderness, or signs of infection (pain, swelling, redness, odor or green/yellow discharge around incision site)   Complete by: As directed    Call MD for:  severe uncontrolled pain   Complete by: As directed    Call MD for:  temperature >100.4   Complete by: As directed    Diet general   Complete by: As directed    Discharge instructions   Complete by: As directed    It has been a pleasure taking care of you! You were hospitalized with splenic laceration/bleeding that was treated with embolization.  Your bleeding has stopped.  You hemoglobin remained stable.  Please avoid any extraneous physical activity or contact sport until you are cleared by your primary care doctor.  Regards to your Pleurx cath, please follow-up with your cardiothoracic surgeon.  Please review your new medication list and the directions before you take your medications. Please call your primary care office as soon as possible to schedule hospital follow-up visit in 1 to 2 weeks.  Take care,   Increase activity slowly   Complete by: As directed      Allergies as of 08/04/2018  Reactions   Morphine And Related Itching   Nortriptyline Other (See Comments)   Significant weight gain   Topamax [topiramate] Diarrhea   nausea   Actifed Cold-allergy  [chlorpheniramine-phenyleph Er] Rash   Amoxicillin Rash   Did it involve swelling of the face/tongue/throat, SOB, or low BP? Unknown Did it involve sudden or severe rash/hives, skin peeling, or any reaction on the inside of your mouth or nose? Unknown Did you need to seek medical attention at a hospital or doctor's office? Unknown When did it last happen? teenager If all above answers are "NO", may proceed with cephalosporin use.   Codeine Hives   Erythromycin Rash   Penicillins Rash   Did it involve swelling of the face/tongue/throat, SOB, or low BP? Unknown Did it involve sudden or severe rash/hives, skin peeling, or any reaction on the inside of your mouth or nose? Unknown Did you need to seek medical attention at a hospital or doctor's office? Unknown When did it last happen? teenager If all above answers are "NO", may proceed with cephalosporin use.   Sudafed [pseudoephedrine Hcl] Rash      Medication List    TAKE these medications   benzonatate 100 MG capsule Commonly known as: Tessalon Perles Take 1 capsule (100 mg total) by mouth 3 (three) times daily as needed for cough.   diazepam 5 MG tablet Commonly known as: VALIUM TAKE ONE TABLET BY MOUTH AT BEDTIME AS NEEDED FOR ANXIETY   docusate sodium 100 MG capsule Commonly known as: Colace Take 1 capsule (100 mg total) by mouth 2 (two) times daily.   gabapentin 300 MG capsule Commonly known as: NEURONTIN Take 1 capsule (300 mg total) by mouth 3 (three) times daily.   Oxycodone HCl 10 MG Tabs Take 1 tablet (10 mg total) by mouth every 4 (four) hours as needed for severe pain or breakthrough pain.   pantoprazole 40 MG tablet Commonly known as: PROTONIX Take 1 tablet (40 mg total) by mouth 2 (two) times daily before a meal.   promethazine 25 MG tablet Commonly known as: PHENERGAN Take 1 tablet (25 mg total) by mouth every 6 (six) hours as needed for nausea, vomiting or refractory nausea / vomiting.     senna-docusate 8.6-50 MG tablet Commonly known as: Senokot-S Take 1 tablet by mouth at bedtime.   SUMAtriptan 100 MG tablet Commonly known as: IMITREX TAKE ONE TABLET BY MOUTH PRN UP to TWICE DAILY AS NEEDED. What changed:   how much to take  how to take this  when to take this  reasons to take this  additional instructions      Follow-up Information    Vyas, Dhruv B, MD. Schedule an appointment as soon as possible for a visit in 2 week(s).   Specialty: Internal Medicine Contact information: [address] [place] [address] [phone]           Consultations:  General surgery  IR  Cardiothoracic surgery  Procedures/Studies:  2D Echo: None  Dg Chest 1 View  Result Date: 07/07/2018 CLINICAL DATA:  Post thoracentesis on the left. EXAM: CHEST  1 VIEW COMPARISON:  Radiographs 07/03/2018 and 06/24/2016.  CT 07/06/2018. FINDINGS: There has been partial re-expansion of the left lung following thoracentesis. There is still a sizable left pleural effusion with near complete whiteout of the left hemithorax. No residual mediastinal shift or pneumothorax identified. The right lung is clear. IMPRESSION: No pneumothorax following thoracentesis on the left. Residual sizable left pleural effusion and left lung collapse.  Electronically Signed   By: Richardean Sale M.D.   On: 07/07/2018 10:31   Dg Chest 2 View  Result Date: 08/04/2018 CLINICAL DATA:  Chest tube in place EXAM: CHEST - 2 VIEW COMPARISON:  Chest x-ray dated 07/31/2018. FINDINGS: LEFT-sided chest tube is stable in position with tip directed towards the medial aspects of the LEFT lung apex. Moderate-sized LEFT pleural effusion. No pneumothorax seen. RIGHT lung is clear. Heart size and mediastinal contours are grossly stable. No acute appearing osseous abnormality. IMPRESSION: Stable chest tube position. Moderate-sized LEFT pleural effusion. No pneumothorax seen. Electronically Signed   By: Franki Cabot M.D.   On:  08/04/2018 10:13   Dg Chest 2 View  Result Date: 07/18/2018 CLINICAL DATA:  Patient with generalized weakness and fever. History of ovarian carcinoma. EXAM: CHEST - 2 VIEW COMPARISON:  Chest radiograph 07/16/2018 FINDINGS: Stable enlarged cardiac and mediastinal contours. Left chest tube remains in position. Persistent moderate left pleural effusion. Similar-appearing patchy consolidation to the left mid and upper lung. No definite pneumothorax. IMPRESSION: Left chest tube remains in position. No definite pneumothorax. Persistent moderate left pleural effusion with underlying consolidation. Electronically Signed   By: Lovey Newcomer M.D.   On: 07/18/2018 15:03   Dg Chest 2 View  Result Date: 07/16/2018 CLINICAL DATA:  Follow-up chest tube removal EXAM: CHEST - 2 VIEW COMPARISON:  07/15/2018 FINDINGS: Cardiac shadow is stable. Right-sided PICC line is again seen and stable. One of the 2 left-sided chest tubes has been removed in the interval. No pneumothorax is identified. Persistent left basilar atelectasis and effusion is seen. IMPRESSION: Interval chest tube removal on the left without recurrent pneumothorax. Stable atelectatic changes and effusion in the left base. Electronically Signed   By: Inez Catalina M.D.   On: 07/16/2018 08:29   Dg Chest 2 View  Result Date: 07/08/2018 CLINICAL DATA:  Thoracentesis, left-sided chest pain EXAM: CHEST - 2 VIEW COMPARISON:  07/07/2018 FINDINGS: No significant change in a large left pleural effusion, with near-total consolidation of the left lung. There is no acute appearing airspace opacity of the aerated portion of the left pulmonary apex. No significant pneumothorax. The right lung is normally aerated. The cardiac silhouette is predominantly obscured. Disc degenerative disease of the thoracic spine IMPRESSION: No significant change in a large left pleural effusion, with near-total consolidation of the left lung. There is no acute appearing airspace opacity of the  aerated portion of the left pulmonary apex. No significant pneumothorax. The right lung is normally aerated. Electronically Signed   By: Eddie Candle M.D.   On: 07/08/2018 09:37   Ct Chest W Contrast  Result Date: 07/09/2018 CLINICAL DATA:  Large, reaccumulating left pleural effusion EXAM: CT CHEST WITH CONTRAST TECHNIQUE: Multidetector CT imaging of the chest was performed during intravenous contrast administration. CONTRAST:  19mL OMNIPAQUE IOHEXOL 300 MG/ML  SOLN COMPARISON:  Chest radiograph, 07/09/2018 FINDINGS: Cardiovascular: No significant vascular findings. Normal heart size. No pericardial effusion. Mediastinum/Nodes: There are prominent although not pathologically enlarged mediastinal lymph nodes. Moderate hiatal hernia. Thyroid gland, trachea, and esophagus demonstrate no significant findings. Lungs/Pleura: There is a moderate, loculated left hydropneumothorax with a small air component and moderate fluid component. The largest loculated component is located posteriorly. There is a pigtail drainage catheter about the lateral pleural space. There is a small right pleural effusion with associated atelectasis or consolidation and a subpleural consolidation of the superior segment right lower lobe (series 4, image 56). Upper Abdomen: No acute abnormality. Musculoskeletal: No chest wall mass  or suspicious bone lesions identified. IMPRESSION: 1. There is a moderate, loculated left hydropneumothorax with a small air component and moderate fluid component. The largest loculated component is located posteriorly. There is a pigtail drainage catheter about the lateral pleural space. There is no obvious etiology, such as obvious mass or pleural disease. 2. There is a small right pleural effusion with associated atelectasis or consolidation and a subpleural consolidation of the superior segment right lower lobe (series 4, image 56), of uncertain significance, possibly infectious or inflammatory. Electronically  Signed   By: Eddie Candle M.D.   On: 07/09/2018 11:31   Ct Angio Chest Pe W And/or Wo Contrast  Result Date: 07/31/2018 CLINICAL DATA:  Operative complication of a Pleurx catheter placement. EXAM: CT ANGIOGRAPHY CHEST, ABDOMEN AND PELVIS TECHNIQUE: Multidetector CT imaging through the chest, abdomen and pelvis was performed using the standard protocol during bolus administration of intravenous contrast. Multiplanar reconstructed images and MIPs were obtained and reviewed to evaluate the vascular anatomy. CONTRAST:  1107mL OMNIPAQUE IOHEXOL 350 MG/ML SOLN COMPARISON:  Same day PET-CT FINDINGS: CTA CHEST FINDINGS Cardiovascular: Preferential opacification of the thoracic aorta. Evaluation of the aortic root limited due to cardiac pulsation artifact. No convincing evidence of thoracic aortic aneurysm or dissection. Normal heart size. No pericardial effusion. Mediastinum/Nodes: Several reactive but not pathologically enlarged mediastinal and left hilar nodes are present, likely reactive to the adjacent inflammatory features given absence of uptake on PET-CT. Similarly a subcentimeter hypoattenuating nodule in the left lobe thyroid without uptake on comparison PET images is likely benign. There is nonspecific thickening of the mid esophagus and a moderate hiatal hernia. The airways are diffusely thickened with scattered secretions. There is extension of a large perisplenic hematoma through the diaphragmatic hiatus and into the lower posterior mediastinum. Lungs/Pleura: Hypoventilatory changes are present along with dependent atelectasis posteriorly. Nodular, rind-like pleural thickening throughout the left hemithorax and within the fissures is similar to PET-CT. No suspicious nodules. A small, loculated left pleural effusion is again seen. Left Pleurx catheter in place terminating along the left mediastinal border directed apically. Musculoskeletal: No chest wall mass or suspicious bone lesions identified. Review of  the MIP images confirms the above findings. CTA ABDOMEN AND PELVIS FINDINGS VASCULAR Aorta: The aorta is normal caliber. No intramural hematoma, dissection flap or other luminal abnormality of the aorta is seen. No periaortic stranding or hemorrhage. Minimal atheromatous plaque near the aortic bifurcation. No aneurysm or ectasia. Celiac: Patent without evidence of aneurysm, dissection, vasculitis or significant stenosis. SMA: Patent without evidence of aneurysm, dissection, vasculitis or significant stenosis. Renals: Both renal arteries are patent without evidence of aneurysm, dissection, vasculitis, fibromuscular dysplasia or significant stenosis. IMA: Patent without evidence of aneurysm, dissection, vasculitis or significant stenosis. Inflow: Patent without evidence of aneurysm, dissection, vasculitis or significant stenosis. Veins: No obvious venous abnormality within the limitations of this arterial phase study. Review of the MIP images confirms the above findings. NON-VASCULAR Hepatobiliary: No focal liver abnormality or hepatic injury is seen. Small amount of perihepatic hemorrhage likely tracking from the site of splenic injury. Patient is post cholecystectomy. Slight prominence of the biliary tree likely related to reservoir effect. No calcified intraductal gallstones. Pancreas: Unremarkable. No pancreatic ductal dilatation or surrounding inflammatory changes. Spleen: There is a 3.9 cm splenic laceration with a faint central hyperattenuating focus which remains stable on precontrast images from PET-CT as well as post-contrast venous phase and delayed images. There is surrounding mixed attenuation hemorrhage. This capsular hemorrhage partially protrudes into the diaphragmatic hiatus (  axial series 12, image 7). Extracapsular hemorrhage is present as well, partially decompressing into the lesser sac along the greater curvature of the stomach. Additional hemoperitoneum is present in the right perihepatic space,  both pericolic gutters and layering within the pelvis. Adrenals/Urinary Tract: No adrenal hemorrhage or renal injury identified. Bladder is unremarkable. Stomach/Bowel: Moderate hiatal hernia. Fluid collection along the greater curvature of the Lymphatic: No enlarged abdominopelvic lymph nodes. Reproductive: Bilateral thick-walled cystic lesions within both enlarged ovaries compatible with patient's known ovarian neoplasms. Anteverted uterus. Other: Hemoperitoneum, as detailed splenic section above. No free gas. Extensive omental caking and thick walled enhancement of the anterior peritoneal surfaces. Musculoskeletal: Diffuse body wall edema. No acute osseous abnormality or suspicious osseous lesion. Postsurgical changes from PleurX catheter placement with stranding in the left anterior flank. Catheter enters at the anterior left sixth interspace. Review of the MIP images confirms the above findings. IMPRESSION: 3.9 cm splenic laceration with central hyperattenuation concerning for contained vascular injury with capsular hematoma extending over greater than 80% of the splenic surface and with intraperitoneal extension of hemorrhage. Findings are compatible with a AAST grade V splenic injury. Urgent surgical consultation is warranted. PleurX catheter in the left 6th interspace, anterior axillary line and terminates along the mediastinal border directed apically. Redemonstration of the complex thick-walled cystic lesions in both adnexa, compatible with known ovarian neoplasm. Extensive omental caking is present as well as thickening along the anterior liver. Rind like thickening of the pleural surfaces within the left hemithorax and associated volume loss with trace effusion. Could reflect postprocedural changes from prior pleurodesis though and malignancy is not excluded. These results were called by telephone at the time of interpretation on 07/31/2018 at 6:15 pm to Dr. Francine Graven, who verbally acknowledged  these results. Electronically Signed   By: Lovena Le M.D.   On: 07/31/2018 18:15   Ct Abdomen Pelvis W Contrast  Result Date: 07/10/2018 CLINICAL DATA:  Large left pleural effusion, suspect gyn primary malignancy EXAM: CT ABDOMEN AND PELVIS WITH CONTRAST TECHNIQUE: Multidetector CT imaging of the abdomen and pelvis was performed using the standard protocol following bolus administration of intravenous contrast. CONTRAST:  134mL OMNIPAQUE IOHEXOL 300 MG/ML  SOLN COMPARISON:  CT chest, 07/09/2018, CT abdomen pelvis, 05/10/2011 FINDINGS: Lower chest: Loculated left-sided pleural effusion with left-sided pleural drainage catheter in position. Small right pleural effusion. Hepatobiliary: No focal liver abnormality is seen. Status post cholecystectomy. No biliary dilatation. Pancreas: Unremarkable. No pancreatic ductal dilatation or surrounding inflammatory changes. Spleen: Normal in size without significant abnormality. Adrenals/Urinary Tract: Adrenal glands are unremarkable. Kidneys are normal, without renal calculi, solid lesion, or hydronephrosis. Bladder is unremarkable. Stomach/Bowel: Stomach is within normal limits. Appendix appears normal. No evidence of bowel wall thickening, distention, or inflammatory changes. Large burden of stool in the colon. Vascular/Lymphatic: No significant vascular findings are present. No enlarged abdominal or pelvic lymph nodes. Reproductive: The bilateral ovaries are enlarged by heterogeneous appearing cystic lesions, measuring 5.3 x 4.2 cm on the right (series 4, image 72) and 4.5 x 3.2 cm on the left (series 4, image 75). Other: No abdominal wall hernia or abnormality. Trace ascites. There is some suggestion of omental and peritoneal nodularity (e.g. Series 4, image 43). Musculoskeletal: No acute or significant osseous findings. IMPRESSION: 1. The bilateral ovaries are enlarged by heterogeneous appearing cystic lesions, measuring 5.3 x 4.2 cm on the right (series 4, image 72)  and 4.5 x 3.2 cm on the left (series 4, image 75). Consider dedicated pelvic ultrasound and/or pelvic  MRI to further evaluate for solid components given high suspicion for GYN primary malignancy. 2. No other evidence of mass and no lymphadenopathy in the abdomen or pelvis. 3. Trace ascites. There is some suggestion of omental and peritoneal nodularity (e.g. Series 4, image 47), concerning for peritoneal metastatic disease. 4. Loculated left-sided pleural effusion with left-sided pleural drainage catheter in position. Small right pleural effusion. Electronically Signed   By: Eddie Candle M.D.   On: 07/10/2018 21:14   Ir Angiogram Visceral Selective  Result Date: 08/01/2018 INDICATION: Splenic rupture with large subcapsular hematoma. Prophylactic embolization requested. EXAM: ULTRASOUND GUIDANCE FOR VASCULAR ACCESS SELECTIVE VISCERAL ARTERIOGRAM COIL EMBOLIZATION SPLENIC ARTERY MEDICATIONS: none indicated ANESTHESIA/SEDATION: Intravenous Fentanyl 116mcg and Versed 3mg  were administered as conscious sedation during continuous monitoring of the patient's level of consciousness and physiological / cardiorespiratory status by the radiology RN, with a total moderate sedation time of 29 minutes. CONTRAST:  40 mL Omnipaque 300 IA PROCEDURE: Informed consent was obtained from the patient following explanation of the procedure, risks, benefits and alternatives. The patient understands, agrees and consents for the procedure. All questions were addressed. A time out was performed prior to the initiation of the procedure. Maximal barrier sterile technique utilized including caps, mask, sterile gowns, sterile gloves, large sterile drape, hand hygiene, and chlorhexidine prep. Patency of right common femoral artery was confirmed with ultrasound and documentation stored. Skin entry site infiltrated with 1% lidocaine. Under real-time ultrasound guidance, the vessel was accessed with a 21-gauge micropuncture needle, exchanged over  a 018 guidewire for a transitional dilator, through which a 035 guidewire was advanced. Over this, a 5 [place] vascular sheath placed. Through this, a 5 French C2 catheter advanced in the celiac axis selected. Celiac arteriography was performed. Tortuous widely patent splenic artery identified. Using an angled Glidewire, the catheter was advanced into the mid splenic artery. Confirmatory selective arteriography was performed. There is distortion of splenic intraparenchymal branches secondary to large subcapsular hematoma. No active extravasation. Through the angiographic catheter, a Renegade microcatheter was advanced into the distal splenic artery. After confirmatory arteriography, the vessel was occluded with two 8 mm interlock coils. Follow-up arteriography demonstrates stasis of flow in the main splenic artery. Catheters removed. After confirmatory femoral arteriogram, sheath removed and hemostasis achieved using Exoseal device. The patient tolerated the procedure well. FLUOROSCOPY TIME:  7 minutes 18 seconds; 540 mGy COMPLICATIONS: None immediate. IMPRESSION: 1. No overt active extravasation from splenic artery branches. 2. Technically successful coil embolization of  splenic artery. Electronically Signed   By: Lucrezia Europe M.D.   On: 08/01/2018 08:04   Ir Angiogram Selective Each Additional Vessel  Result Date: 08/01/2018 INDICATION: Splenic rupture with large subcapsular hematoma. Prophylactic embolization requested. EXAM: ULTRASOUND GUIDANCE FOR VASCULAR ACCESS SELECTIVE VISCERAL ARTERIOGRAM COIL EMBOLIZATION SPLENIC ARTERY MEDICATIONS: none indicated ANESTHESIA/SEDATION: Intravenous Fentanyl 18mcg and Versed 3mg  were administered as conscious sedation during continuous monitoring of the patient's level of consciousness and physiological / cardiorespiratory status by the radiology RN, with a total moderate sedation time of 29 minutes. CONTRAST:  40 mL Omnipaque 300 IA PROCEDURE: Informed consent was  obtained from the patient following explanation of the procedure, risks, benefits and alternatives. The patient understands, agrees and consents for the procedure. All questions were addressed. A time out was performed prior to the initiation of the procedure. Maximal barrier sterile technique utilized including caps, mask, sterile gowns, sterile gloves, large sterile drape, hand hygiene, and chlorhexidine prep. Patency of right common femoral artery was confirmed with ultrasound and documentation  stored. Skin entry site infiltrated with 1% lidocaine. Under real-time ultrasound guidance, the vessel was accessed with a 21-gauge micropuncture needle, exchanged over a 018 guidewire for a transitional dilator, through which a 035 guidewire was advanced. Over this, a 5 [place] vascular sheath placed. Through this, a 5 French C2 catheter advanced in the celiac axis selected. Celiac arteriography was performed. Tortuous widely patent splenic artery identified. Using an angled Glidewire, the catheter was advanced into the mid splenic artery. Confirmatory selective arteriography was performed. There is distortion of splenic intraparenchymal branches secondary to large subcapsular hematoma. No active extravasation. Through the angiographic catheter, a Renegade microcatheter was advanced into the distal splenic artery. After confirmatory arteriography, the vessel was occluded with two 8 mm interlock coils. Follow-up arteriography demonstrates stasis of flow in the main splenic artery. Catheters removed. After confirmatory femoral arteriogram, sheath removed and hemostasis achieved using Exoseal device. The patient tolerated the procedure well. FLUOROSCOPY TIME:  7 minutes 18 seconds; 676 mGy COMPLICATIONS: None immediate. IMPRESSION: 1. No overt active extravasation from splenic artery branches. 2. Technically successful coil embolization of  splenic artery. Electronically Signed   By: Lucrezia Europe M.D.   On: 08/01/2018 08:04    Nm Pet Image Initial (pi) Skull Base To Thigh  Result Date: 07/31/2018 CLINICAL DATA:  Initial treatment strategy for ovarian carcinoma. EXAM: NUCLEAR MEDICINE PET SKULL BASE TO THIGH TECHNIQUE: 8.16 mCi F-18 FDG was injected intravenously. Full-ring PET imaging was performed from the skull base to thigh after the radiotracer. CT data was obtained and used for attenuation correction and anatomic localization. Fasting blood glucose: 102 mg/dl COMPARISON:  CT 07/10/2018 FINDINGS: Mediastinal blood pool activity: SUV max 2.94 Liver activity: SUV max 4 NECK: No hypermetabolic lymph nodes in the neck. Incidental CT findings: none CHEST: Rind of hypermetabolic activity along the medial mediastinum pleural surface of the LEFT lung consistent with talc pleurodesis. There is loculated lung at the LEFT lung base with a rim of hypermetabolic activity also related to pleurodesis. No hypermetabolic mediastinal lymph nodes. Mild metabolic activity associated the mid esophagus (image 71) Incidental CT findings: No suspicious pulmonary nodules. PleurX catheter in place. ABDOMEN/PELVIS: There is hypermetabolic tissue associated with the enlarged ovaries. The hypermetabolic portion corresponds to the thickened soft tissue component along the walls of the cystic ovarian masses. The RIGHT ovarian mass measures 5.8 Cm with SUV max equal 6.7 along the soft tissue wall thickening portion. The cystic portion is non metabolic. The LEFT ovary is smaller measuring 4.3 cm with hypermetabolic tissue medially similar to the RIGHT. There is a rim of hypermetabolic activity along the inferior margin of the RIGHT hepatic lobe which corresponds to subtle peritoneal thickening. This metabolic activity is above background liver activity with SUV max equal 5.0 compared to 3.0 in the background liver. A similar hypermetabolic peritoneal thickening in the LEFT ventral peritoneal surface with SUV max equal 3.4 on image 112. Mild hypermetabolic  nodularity in the greater omentum with SUV max equal 3.0 on image 113. Incidental CT findings: There is a large splenic hematoma superior to the spleen confined superiorly by the LEFT hemidiaphragm. This large round hematoma measures 10.5 x 11.3 by 9.0 cm. Within the hematoma is bounded inferiorly by the spleen superiorly by the diaphragm as seen on coronal image 67/126 and PET-CT image 131. SKELETON: No focal hypermetabolic activity to suggest skeletal metastasis. Incidental CT findings: none IMPRESSION: 1. Hypermetabolic tissue associated with cystic masses of the LEFT RIGHT ovary consistent with ovarian carcinoma. 2. Evidence of  carcinoma metastasis with rind of hypermetabolic tissue along the inferior margin of liver RIGHT hepatic lobe as well as on along the ventral LEFT peritoneal space and greater omentum. 3. Hypermetabolic tissue in the LEFT pleural space is related to talc pleurodesis. Cannot exclude underlying malignancy. 4. Incidental finding of a large hematoma in LEFT upper quadrant bound superiorly by the LEFT hemidiaphragm and inferiorly by the spleen. Recommend CTA of the abdomen to evaluate for persistent hemorrhage from the spleen and surgical consultation. Findings conveyed toSREEDHAR KATRAGADDA on 07/31/2018  at13:20. Electronically Signed   By: Suzy Bouchard M.D.   On: 07/31/2018 13:31   Ir US Guide Vasc Access Right  Result Date: 08/01/2018 INDICATION: Splenic rupture with large subcapsular hematoma. Prophylactic embolization requested. EXAM: ULTRASOUND GUIDANCE FOR VASCULAR ACCESS SELECTIVE VISCERAL ARTERIOGRAM COIL EMBOLIZATION SPLENIC ARTERY MEDICATIONS: none indicated ANESTHESIA/SEDATION: Intravenous Fentanyl 133mcg and Versed 3mg  were administered as conscious sedation during continuous monitoring of the patient's level of consciousness and physiological / cardiorespiratory status by the radiology RN, with a total moderate sedation time of 29 minutes. CONTRAST:  40 mL Omnipaque 300  IA PROCEDURE: Informed consent was obtained from the patient following explanation of the procedure, risks, benefits and alternatives. The patient understands, agrees and consents for the procedure. All questions were addressed. A time out was performed prior to the initiation of the procedure. Maximal barrier sterile technique utilized including caps, mask, sterile gowns, sterile gloves, large sterile drape, hand hygiene, and chlorhexidine prep. Patency of right common femoral artery was confirmed with ultrasound and documentation stored. Skin entry site infiltrated with 1% lidocaine. Under real-time ultrasound guidance, the vessel was accessed with a 21-gauge micropuncture needle, exchanged over a 018 guidewire for a transitional dilator, through which a 035 guidewire was advanced. Over this, a 5 [place] vascular sheath placed. Through this, a 5 French C2 catheter advanced in the celiac axis selected. Celiac arteriography was performed. Tortuous widely patent splenic artery identified. Using an angled Glidewire, the catheter was advanced into the mid splenic artery. Confirmatory selective arteriography was performed. There is distortion of splenic intraparenchymal branches secondary to large subcapsular hematoma. No active extravasation. Through the angiographic catheter, a Renegade microcatheter was advanced into the distal splenic artery. After confirmatory arteriography, the vessel was occluded with two 8 mm interlock coils. Follow-up arteriography demonstrates stasis of flow in the main splenic artery. Catheters removed. After confirmatory femoral arteriogram, sheath removed and hemostasis achieved using Exoseal device. The patient tolerated the procedure well. FLUOROSCOPY TIME:  7 minutes 18 seconds; 010 mGy COMPLICATIONS: None immediate. IMPRESSION: 1. No overt active extravasation from splenic artery branches. 2. Technically successful coil embolization of  splenic artery. Electronically Signed   By: Lucrezia Europe M.D.   On: 08/01/2018 08:04   Dg Chest Port 1 View  Result Date: 07/31/2018 CLINICAL DATA:  PleurX, effusion, suspected intra-abdominal hemorrhage on PET-CT EXAM: PORTABLE CHEST 1 VIEW COMPARISON:  Same day PET-CT, chest radiograph, 07/18/2018 FINDINGS: No significant change in chest radiograph with a moderate left pleural effusion and associated atelectasis or consolidation, with a tunneled left-sided chest catheter in position about the posterior pleural space. The right lung is normally aerated. Cardiomegaly. IMPRESSION: No significant change in chest radiograph with a moderate left pleural effusion and associated atelectasis or consolidation, with a tunneled left-sided chest catheter in position about the posterior pleural space. The right lung is normally aerated. Cardiomegaly. Electronically Signed   By: Eddie Candle M.D.   On: 07/31/2018 15:37   Dg Chest Port 1  View  Result Date: 07/15/2018 CLINICAL DATA:  Chest 2 EXAM: PORTABLE CHEST 1 VIEW COMPARISON:  Portable exam 4010 hours compared to 07/14/2018 FINDINGS: Pair of LEFT thoracostomy tubes are again identified. RIGHT arm PICC line tip projects over SVC. Upper normal heart size. Mediastinal contours and pulmonary vascularity normal. Minimal pleural effusion at RIGHT base. Tiny LEFT apex pneumothorax. Decreased atelectasis LEFT lower lobe. Remaining lungs clear. IMPRESSION: Minimal LEFT pleural effusion and tiny LEFT pneumothorax despite thoracostomy tubes. Decreased atelectasis LEFT lower lobe. Electronically Signed   By: Lavonia Dana M.D.   On: 07/15/2018 08:50   Dg Chest Port 1 View  Result Date: 07/14/2018 CLINICAL DATA:  Follow-up pleural effusion EXAM: PORTABLE CHEST 1 VIEW COMPARISON:  07/13/2010 FINDINGS: Cardiac shadow is stable. Two chest tubes are again identified on the left. No definitive pneumothorax is identified. Mild haziness is noted at the left base consistent with small residual effusion. Left retrocardiac atelectasis is  again noted. Right-sided PICC line is seen in satisfactory position. IMPRESSION: Stable chest tubes on the left. No pneumothorax is noted. Persistent basilar density is noted consistent with atelectasis and small effusion. Electronically Signed   By: Inez Catalina M.D.   On: 07/14/2018 07:06   Dg Chest Port 1 View  Result Date: 07/13/2018 CLINICAL DATA:  Status post left-sided VATS. EXAM: PORTABLE CHEST 1 VIEW COMPARISON:  July 13, 2018 FINDINGS: The patient has been extubated. The right-sided PICC line is well position. Two left-sided chest tubes are in place. There is no large pneumothorax. Subcutaneous gas is noted along the patient's left flank. A dense retrocardiac opacity is noted. There are small bilateral pleural effusions. No right-sided pneumothorax. No acute osseous abnormality. IMPRESSION: 1. Status post extubation.  Remaining lines and tubes as above. 2. Persistent dense retrocardiac opacity, stable from prior study. 3. Subcutaneous gas along the patient's left flank, slightly increased from prior study. Electronically Signed   By: Constance Holster M.D.   On: 07/13/2018 21:45   Dg Chest Port 1 View  Addendum Date: 07/13/2018   ADDENDUM REPORT: 07/13/2018 18:14 ADDENDUM: The patient's RN reports that the patient has been extubated at this time. Electronically Signed   By: Genevie Ann M.D.   On: 07/13/2018 18:14   Result Date: 07/13/2018 CLINICAL DATA:  48 year old female status post left VATS today for loculated effusion. Postoperative day zero. EXAM: PORTABLE CHEST 1 VIEW COMPARISON:  07/11/2018 and earlier. FINDINGS: Portable AP supine view at 1801 hours. Two left chest tubes are in place. Both tubes course toward the medial left apex. No pneumothorax identified. Endotracheal tube in place with evidence of a left mainstem bronchus intubation. Stable right IJ PICC line. Lower lung volumes. Stable cardiac size and mediastinal contours. Regressed but not fully resolved dense left lung base opacity.  Negative visible bowel gas pattern. Stable cholecystectomy clips. IMPRESSION: 1. Left mainstem bronchus intubation suspected. Withdrawal the ET tube 2 cm and repeat a portable chest. 2. Two left chest tubes in place. No pneumothorax identified. 3. Lower lung volumes.  Mildly improved left lung base ventilation. Electronically Signed: By: Genevie Ann M.D. On: 07/13/2018 18:01   Dg Chest Port 1 View  Result Date: 07/11/2018 CLINICAL DATA:  48 year old female with a history of left-sided pleural effusion. EXAM: PORTABLE CHEST 1 VIEW COMPARISON:  07/10/2018, 07/09/2018, CT 07/09/2010 FINDINGS: Cardiomediastinal silhouette unchanged with the left heart border partially obscured by lung/pleural disease. Unchanged left thoracostomy tube. Contour of the medial aspect of the left upper lobe is visualized at the apex,  the so-called "luftsichel sign", new from the comparison and representing partial collapse of the left upper lobe. Right lung relatively well aerated. No new confluent airspace disease Unchanged right upper extremity PICC. IMPRESSION: Unchanged left-sided thoracostomy tube, with hydropneumothorax. The fluid component is relatively unchanged at the left lung base, with new left upper lobe collapse and small gas component at the apex. Opacity at the left lung base likely a combination of pleural fluid/loculated pleural fluid and associated atelectasis/consolidation. Unchanged right upper extremity PICC. Electronically Signed   By: Corrie Mckusick D.O.   On: 07/11/2018 08:53   Dg Chest Port 1 View  Result Date: 07/10/2018 CLINICAL DATA:  Pleural effusion EXAM: PORTABLE CHEST 1 VIEW COMPARISON:  Chest CT and chest radiograph July 09, 2018 FINDINGS: Chest tube position is unchanged. No pneumothorax is demonstrable by radiography. Central catheter tip is in the superior vena cava. Moderate pleural effusion persists on the left with compressive atelectasis in the left lower lobe. No new opacity evident on either  side. Heart is upper normal in size, stable, with pulmonary vascularity within normal limits. No adenopathy. No bone lesions. IMPRESSION: Persistent left pleural effusion with left base compressive atelectasis. No new opacity evident. Stable cardiac silhouette. Tube and catheter positions unchanged without demonstrable pneumothorax by radiography. Electronically Signed   By: Lowella Grip III M.D.   On: 07/10/2018 09:33   Dg Chest Port 1 View  Result Date: 07/09/2018 CLINICAL DATA:  Left pleural effusion.  Chest tube in place. EXAM: PORTABLE CHEST 1 VIEW COMPARISON:  PA and lateral chest 07/08/2018. FINDINGS: New right PICC is identified with its tip projecting at the superior cavoatrial junction. The patient also has a new pigtail catheter in the left chest. Small to moderate left pleural effusion is decreased compared to the prior examination. No pneumothorax. Left basilar airspace disease persists. Small focus of discoid atelectasis right mid lung noted. Heart size is upper normal. IMPRESSION: Decreased left pleural effusion with a new chest tube in place. Negative for pneumothorax. Electronically Signed   By: Inge Rise M.D.   On: 07/09/2018 09:14   Dg Chest Port 1 View  Result Date: 07/07/2018 CLINICAL DATA:  Left-sided pleural effusion status post thoracentesis EXAM: PORTABLE CHEST 1 VIEW COMPARISON:  July 07, 2018 FINDINGS: There is a persistent large left-sided pleural effusion. There is no pneumothorax. The heart is difficult to fully evaluate on this exam. The right lung field is mostly clear. There is some mild volume overload. IMPRESSION: Interval decrease in size of the previously demonstrated left-sided pleural effusion. There is a persistent large left-sided pleural effusion without evidence of a pneumothorax. Electronically Signed   By: Constance Holster M.D.   On: 07/07/2018 23:33   Dierks Guide Roadmapping  Result Date:  08/01/2018 INDICATION: Splenic rupture with large subcapsular hematoma. Prophylactic embolization requested. EXAM: ULTRASOUND GUIDANCE FOR VASCULAR ACCESS SELECTIVE VISCERAL ARTERIOGRAM COIL EMBOLIZATION SPLENIC ARTERY MEDICATIONS: none indicated ANESTHESIA/SEDATION: Intravenous Fentanyl 173mcg and Versed 3mg  were administered as conscious sedation during continuous monitoring of the patient's level of consciousness and physiological / cardiorespiratory status by the radiology RN, with a total moderate sedation time of 29 minutes. CONTRAST:  40 mL Omnipaque 300 IA PROCEDURE: Informed consent was obtained from the patient following explanation of the procedure, risks, benefits and alternatives. The patient understands, agrees and consents for the procedure. All questions were addressed. A time out was performed prior to the initiation of the procedure. Maximal barrier sterile technique  utilized including caps, mask, sterile gowns, sterile gloves, large sterile drape, hand hygiene, and chlorhexidine prep. Patency of right common femoral artery was confirmed with ultrasound and documentation stored. Skin entry site infiltrated with 1% lidocaine. Under real-time ultrasound guidance, the vessel was accessed with a 21-gauge micropuncture needle, exchanged over a 018 guidewire for a transitional dilator, through which a 035 guidewire was advanced. Over this, a 5 [place] vascular sheath placed. Through this, a 5 French C2 catheter advanced in the celiac axis selected. Celiac arteriography was performed. Tortuous widely patent splenic artery identified. Using an angled Glidewire, the catheter was advanced into the mid splenic artery. Confirmatory selective arteriography was performed. There is distortion of splenic intraparenchymal branches secondary to large subcapsular hematoma. No active extravasation. Through the angiographic catheter, a Renegade microcatheter was advanced into the distal splenic artery. After  confirmatory arteriography, the vessel was occluded with two 8 mm interlock coils. Follow-up arteriography demonstrates stasis of flow in the main splenic artery. Catheters removed. After confirmatory femoral arteriogram, sheath removed and hemostasis achieved using Exoseal device. The patient tolerated the procedure well. FLUOROSCOPY TIME:  7 minutes 18 seconds; 989 mGy COMPLICATIONS: None immediate. IMPRESSION: 1. No overt active extravasation from splenic artery branches. 2. Technically successful coil embolization of  splenic artery. Electronically Signed   By: Lucrezia Europe M.D.   On: 08/01/2018 08:04   Ir Picc Placement Right >5 Yrs Inc Img Guide  Result Date: 07/08/2018 CLINICAL DATA:  Large left pleural effusion, poor IV access EXAM: PICC PLACEMENT WITH ULTRASOUND AND FLUOROSCOPY FLUOROSCOPY TIME:  18 seconds; 3 mGy TECHNIQUE: After written informed consent was obtained, patient was placed in the supine position on angiographic table. Patency of the right brachial vein was confirmed with ultrasound with image documentation. An appropriate skin site was determined. Skin site was marked. Region was prepped using maximum barrier technique including cap and mask, sterile gown, sterile gloves, large sterile sheet, and Chlorhexidine as cutaneous antisepsis. The region was infiltrated locally with 1% lidocaine. Under real-time ultrasound guidance, the right brachial vein was accessed with a 21 gauge micropuncture needle; the needle tip within the vein was confirmed with ultrasound image documentation. Needle exchanged over a 018 guidewire for a peel-away sheath, through which a 5-French double-lumen power injectable PICC trimmed to 38cm was advanced, positioned with its tip near the cavoatrial junction. Spot chest radiograph confirms appropriate catheter position. Catheter was flushed per protocol and secured externally. The patient tolerated procedure well. COMPLICATIONS: COMPLICATIONS none IMPRESSION: 1.  Technically successful five [place] double lumen power injectable PICC placement Electronically Signed   By: Lucrezia Europe M.D.   On: 07/08/2018 16:40   Ct Angio Abd/pel W And/or Wo Contrast  Result Date: 07/31/2018 CLINICAL DATA:  Operative complication of a Pleurx catheter placement. EXAM: CT ANGIOGRAPHY CHEST, ABDOMEN AND PELVIS TECHNIQUE: Multidetector CT imaging through the chest, abdomen and pelvis was performed using the standard protocol during bolus administration of intravenous contrast. Multiplanar reconstructed images and MIPs were obtained and reviewed to evaluate the vascular anatomy. CONTRAST:  168mL OMNIPAQUE IOHEXOL 350 MG/ML SOLN COMPARISON:  Same day PET-CT FINDINGS: CTA CHEST FINDINGS Cardiovascular: Preferential opacification of the thoracic aorta. Evaluation of the aortic root limited due to cardiac pulsation artifact. No convincing evidence of thoracic aortic aneurysm or dissection. Normal heart size. No pericardial effusion. Mediastinum/Nodes: Several reactive but not pathologically enlarged mediastinal and left hilar nodes are present, likely reactive to the adjacent inflammatory features given absence of uptake on PET-CT. Similarly a subcentimeter hypoattenuating nodule  in the left lobe thyroid without uptake on comparison PET images is likely benign. There is nonspecific thickening of the mid esophagus and a moderate hiatal hernia. The airways are diffusely thickened with scattered secretions. There is extension of a large perisplenic hematoma through the diaphragmatic hiatus and into the lower posterior mediastinum. Lungs/Pleura: Hypoventilatory changes are present along with dependent atelectasis posteriorly. Nodular, rind-like pleural thickening throughout the left hemithorax and within the fissures is similar to PET-CT. No suspicious nodules. A small, loculated left pleural effusion is again seen. Left Pleurx catheter in place terminating along the left mediastinal border directed  apically. Musculoskeletal: No chest wall mass or suspicious bone lesions identified. Review of the MIP images confirms the above findings. CTA ABDOMEN AND PELVIS FINDINGS VASCULAR Aorta: The aorta is normal caliber. No intramural hematoma, dissection flap or other luminal abnormality of the aorta is seen. No periaortic stranding or hemorrhage. Minimal atheromatous plaque near the aortic bifurcation. No aneurysm or ectasia. Celiac: Patent without evidence of aneurysm, dissection, vasculitis or significant stenosis. SMA: Patent without evidence of aneurysm, dissection, vasculitis or significant stenosis. Renals: Both renal arteries are patent without evidence of aneurysm, dissection, vasculitis, fibromuscular dysplasia or significant stenosis. IMA: Patent without evidence of aneurysm, dissection, vasculitis or significant stenosis. Inflow: Patent without evidence of aneurysm, dissection, vasculitis or significant stenosis. Veins: No obvious venous abnormality within the limitations of this arterial phase study. Review of the MIP images confirms the above findings. NON-VASCULAR Hepatobiliary: No focal liver abnormality or hepatic injury is seen. Small amount of perihepatic hemorrhage likely tracking from the site of splenic injury. Patient is post cholecystectomy. Slight prominence of the biliary tree likely related to reservoir effect. No calcified intraductal gallstones. Pancreas: Unremarkable. No pancreatic ductal dilatation or surrounding inflammatory changes. Spleen: There is a 3.9 cm splenic laceration with a faint central hyperattenuating focus which remains stable on precontrast images from PET-CT as well as post-contrast venous phase and delayed images. There is surrounding mixed attenuation hemorrhage. This capsular hemorrhage partially protrudes into the diaphragmatic hiatus (axial series 12, image 7). Extracapsular hemorrhage is present as well, partially decompressing into the lesser sac along the greater  curvature of the stomach. Additional hemoperitoneum is present in the right perihepatic space, both pericolic gutters and layering within the pelvis. Adrenals/Urinary Tract: No adrenal hemorrhage or renal injury identified. Bladder is unremarkable. Stomach/Bowel: Moderate hiatal hernia. Fluid collection along the greater curvature of the Lymphatic: No enlarged abdominopelvic lymph nodes. Reproductive: Bilateral thick-walled cystic lesions within both enlarged ovaries compatible with patient's known ovarian neoplasms. Anteverted uterus. Other: Hemoperitoneum, as detailed splenic section above. No free gas. Extensive omental caking and thick walled enhancement of the anterior peritoneal surfaces. Musculoskeletal: Diffuse body wall edema. No acute osseous abnormality or suspicious osseous lesion. Postsurgical changes from PleurX catheter placement with stranding in the left anterior flank. Catheter enters at the anterior left sixth interspace. Review of the MIP images confirms the above findings. IMPRESSION: 3.9 cm splenic laceration with central hyperattenuation concerning for contained vascular injury with capsular hematoma extending over greater than 80% of the splenic surface and with intraperitoneal extension of hemorrhage. Findings are compatible with a AAST grade V splenic injury. Urgent surgical consultation is warranted. PleurX catheter in the left 6th interspace, anterior axillary line and terminates along the mediastinal border directed apically. Redemonstration of the complex thick-walled cystic lesions in both adnexa, compatible with known ovarian neoplasm. Extensive omental caking is present as well as thickening along the anterior liver. Rind like thickening of the pleural  surfaces within the left hemithorax and associated volume loss with trace effusion. Could reflect postprocedural changes from prior pleurodesis though and malignancy is not excluded. These results were called by telephone at the time  of interpretation on 07/31/2018 at 6:15 pm to Dr. Francine Graven, who verbally acknowledged these results. Electronically Signed   By: Lovena Le M.D.   On: 07/31/2018 18:15   Ir Thoracentesis Asp Pleural Space W/img Guide  Result Date: 07/07/2018 INDICATION: Patient with history of migraines, tobacco abuse and new onset dyspnea with large left pleural effusion of uncertain etiology. Request for diagnostic and therapeutic left side thoracentesis today in IR. EXAM: ULTRASOUND GUIDED LEFT THORACENTESIS MEDICATIONS: 13 mL 1% lidocaine COMPLICATIONS: None immediate. PROCEDURE: An ultrasound guided thoracentesis was thoroughly discussed with the patient and questions answered. The benefits, risks, alternatives and complications were also discussed. The patient understands and wishes to proceed with the procedure. Written consent was obtained. Ultrasound was performed to localize and mark an adequate pocket of fluid in the left chest. The area was then prepped and draped in the normal sterile fashion. 1% Lidocaine was used for local anesthesia. Under ultrasound guidance a 6 Fr Safe-T-Centesis catheter was introduced. Thoracentesis was performed. The catheter was removed and a dressing applied. FINDINGS: A total of approximately 2.0 L of golden yellow, blood tinged fluid was removed. Samples were sent to the laboratory as requested by the clinical team. IMPRESSION: Successful ultrasound guided left thoracentesis yielding 2.0 L of pleural fluid. Read by Candiss Norse, PA-C Electronically Signed   By: Sandi Mariscal M.D.   On: 07/07/2018 10:36   Ir Perc Pleural Drain W/indwell Cath W/img Guide  Result Date: 07/08/2018 CLINICAL DATA:  Large left pleural effusion EXAM: LEFT CHEST TUBE PLACEMENT UNDER ULTRASOUND AND FLUOROSCOPIC GUIDANCE ANESTHESIA/SEDATION: Intravenous Fentanyl 13mcg and Versed 4mg  were administered as conscious sedation during continuous monitoring of the patient's level of consciousness and  physiological / cardiorespiratory status by the radiology RN, with a total moderate sedation time of 49 minutes. PROCEDURE: The procedure, risks, benefits, and alternatives were explained to the patient. Questions regarding the procedure were encouraged and answered. The patient understands and consents to the procedure. appropriate skin entry site was determined with ultrasound and marked. The operative field was prepped with chlorhexidinein a sterile fashion, and a sterile drape was applied covering the operative field. A sterile gown and sterile gloves were used for the procedure. Local anesthesia was provided with 1% Lidocaine. Under ultrasound guidance, a 19 gauge percutaneous entry needle advanced into the left pleural space from a lateral approach. Blood-tinged fluid could be aspirated. Amplatz guidewire advanced towards the apex under fluoroscopy. Tract dilated to facilitate placement of 14 French pigtail catheter, directed towards the apex. Catheter secured externally with 0 Prolene suture and placed to Pleur-evac. 500 mL returned spontaneously into the canister without suction. Site was covered with a sterile dressing the patient returned to her room in good condition. COMPLICATIONS: None immediate FINDINGS: Large left pleural effusion was noted. 14 French pigtail catheter placed as above. IMPRESSION: 1. Technically successful placement of left 14 French pigtail chest drain, placed to Pleur-evac water-seal. Electronically Signed   By: Lucrezia Europe M.D.   On: 07/08/2018 16:42     Subjective: No major events overnight of this morning.  Has good sleep overnight.  Frustrated about going home with a Pleurx cath in her left chest.     Discharge Exam: Vitals:   08/04/18 0453 08/04/18 0829  BP: (!) 99/59 (!) 98/56  Pulse:  91   Resp: 14 20  Temp: 98.5 F (36.9 C)   SpO2: 92% 95%    GENERAL: No acute distress.  Appears well.  HEENT: MMM.  Vision and hearing grossly intact.  NECK: Supple.  No JVD.    LUNGS:  No IWOB. Good air movement bilaterally. HEART:  RRR. Heart sounds normal.  ABD: Bowel sounds present. Soft.  Tenderness to palpation of RUQ and adjacent chest wall around Pleurx cath. MSK/EXT:  Moves all extremities. No apparent deformity. No edema bilaterally.  SKIN: no apparent skin lesion or wound NEURO: Awake, alert and oriented appropriately.  No gross deficit.  PSYCH: Calm. Normal affect.    The results of significant diagnostics from this hospitalization (including imaging, microbiology, ancillary and laboratory) are listed below for reference.     Microbiology: Recent Results (from the past 240 hour(s))  SARS Coronavirus 2 (CEPHEID - Performed in [place] hospital lab), Hosp Order     Status: None   Collection Time: 07/31/18  6:45 PM   Specimen: Nasopharyngeal Swab  Result Value Ref Range Status   SARS Coronavirus 2 NEGATIVE NEGATIVE Final    Comment: (NOTE) If result is NEGATIVE SARS-CoV-2 target nucleic acids are NOT DETECTED. The SARS-CoV-2 RNA is generally detectable in upper and lower  respiratory specimens during the acute phase of infection. The lowest  concentration of SARS-CoV-2 viral copies this assay can detect is 250  copies / mL. A negative result does not preclude SARS-CoV-2 infection  and should not be used as the sole basis for treatment or other  patient management decisions.  A negative result may occur with  improper specimen collection / handling, submission of specimen other  than nasopharyngeal swab, presence of viral mutation(s) within the  areas targeted by this assay, and inadequate number of viral copies  (<250 copies / mL). A negative result must be combined with clinical  observations, patient history, and epidemiological information. If result is POSITIVE SARS-CoV-2 target nucleic acids are DETECTED. The SARS-CoV-2 RNA is generally detectable in upper and lower  respiratory specimens dur ing the acute phase of infection.  Positive   results are indicative of active infection with SARS-CoV-2.  Clinical  correlation with patient history and other diagnostic information is  necessary to determine patient infection status.  Positive results do  not rule out bacterial infection or co-infection with other viruses. If result is PRESUMPTIVE POSTIVE SARS-CoV-2 nucleic acids MAY BE PRESENT.   A presumptive positive result was obtained on the submitted specimen  and confirmed on repeat testing.  While 2019 novel coronavirus  (SARS-CoV-2) nucleic acids may be present in the submitted sample  additional confirmatory testing may be necessary for epidemiological  and / or clinical management purposes  to differentiate between  SARS-CoV-2 and other Sarbecovirus currently known to infect humans.  If clinically indicated additional testing with an alternate test  methodology [phone]) is advised. The SARS-CoV-2 RNA is generally  detectable in upper and lower respiratory sp ecimens during the acute  phase of infection. The expected result is Negative. Fact Sheet for Patients:  StrictlyIdeas.no Fact Sheet for Healthcare Providers: BankingDealers.co.za This test is not yet approved or cleared by the [place] FDA and has been authorized for detection and/or diagnosis of SARS-CoV-2 by FDA under an Emergency Use Authorization (EUA).  This EUA will remain in effect (meaning this test can be used) for the duration of the COVID-19 declaration under Section 564(b)(1) of the Act, 21 U.S.C. section 360bbb-3(b)(1), unless the authorization is  terminated or revoked sooner. Performed at [facility], [address], [place], [place] [address]      Labs: BNP (last 3 results) Recent Labs    07/07/18 0010  BNP 38.8   Basic Metabolic Panel: Recent Labs  Lab 07/31/18 1500 08/02/18 1019  NA 136 134*  K 3.8 4.8  CL 101 100  CO2 25 22  GLUCOSE 95 97  BUN 9 <5*  CREATININE 0.63 0.75   CALCIUM 9.0 9.1  PHOS  --  4.3   Liver Function Tests: Recent Labs  Lab 07/31/18 1500 08/02/18 1019  AST 25  --   ALT 27  --   ALKPHOS 138*  --   BILITOT 0.9  --   PROT 7.1  --   ALBUMIN 3.2* 2.7*   No results for input(s): LIPASE, AMYLASE in the last 168 hours. No results for input(s): AMMONIA in the last 168 hours. CBC: Recent Labs  Lab 07/31/18 1500 08/01/18 1212 08/02/18 1019  WBC 7.2 8.8 10.0  NEUTROABS 4.7 5.8 7.1  HGB 12.0 11.6* 12.6  HCT 38.5 36.7 38.8  MCV 95.3 95.3 93.7  PLT 699* 667* 384   Cardiac Enzymes: No results for input(s): CKTOTAL, CKMB, CKMBINDEX, TROPONINI in the last 168 hours. BNP: Invalid input(s): POCBNP CBG: Recent Labs  Lab 07/31/18 0849  GLUCAP 102*   D-Dimer No results for input(s): DDIMER in the last 72 hours. Hgb A1c No results for input(s): HGBA1C in the last 72 hours. Lipid Profile No results for input(s): CHOL, HDL, LDLCALC, TRIG, CHOLHDL, LDLDIRECT in the last 72 hours. Thyroid function studies No results for input(s): TSH, T4TOTAL, T3FREE, THYROIDAB in the last 72 hours.  Invalid input(s): FREET3 Anemia work up No results for input(s): VITAMINB12, FOLATE, FERRITIN, TIBC, IRON, RETICCTPCT in the last 72 hours. Urinalysis    Component Value Date/Time   COLORURINE YELLOW 07/18/2018 1803   APPEARANCEUR CLEAR 07/18/2018 1803   LABSPEC 1.011 07/18/2018 1803   PHURINE 5.0 07/18/2018 1803   GLUCOSEU NEGATIVE 07/18/2018 1803   HGBUR NEGATIVE 07/18/2018 1803   BILIRUBINUR NEGATIVE 07/18/2018 1803   KETONESUR NEGATIVE 07/18/2018 1803   PROTEINUR NEGATIVE 07/18/2018 1803   NITRITE NEGATIVE 07/18/2018 1803   LEUKOCYTESUR NEGATIVE 07/18/2018 1803   Sepsis Labs Invalid input(s): PROCALCITONIN,  WBC,  LACTICIDVEN   Time coordinating discharge: 35 minutes  SIGNED:  Mercy Riding, MD  [facility] 08/04/2018, 10:27 AM  If 7PM-7AM, please contact night-coverage [email or web address] Password [email or web address]

## 2018-08-04 NOTE — Progress Notes (Signed)
Michelle Holt was discharged today from [facility] hospital with her L pleurX catheter intact.  Dr. Prescott Gum has ordered her to be seen in our office in two weeks for follow up with a CXR.  Also, he wants [facility] to only drain the catheter on  Mondays.  I called the [place] office and relayed this message to the case manager this afternoon.  She noted understanding.  I said we would see her in 2 weeks.

## 2018-08-04 NOTE — Progress Notes (Signed)
Patient given discharge instructions medication list and prescriptions from [facility]. Follow up appointments given. Dr. Prescott Gum stated directly to patient that patients appointment with his office would be changed an they would call her at home. Patient verbalized understanding. Written on discharge paper work as a reminder. IV and tele were dcd. Will discharge home as ordered. Transported to exit with wheel chair and nursing staff. Taryne Kiger, [name] RN

## 2018-08-04 NOTE — Progress Notes (Signed)
Pleurex catheter drained per order. 31mL returned. Catheter redressed.  Clyde Canterbury, RN

## 2018-08-04 NOTE — Progress Notes (Signed)
Patient very tearful this AM and requesting medication for anxiety. Patient also requesting to shower. Dr. Cyndia Skeeters made aware through [facility] system. Will monitor patient. [name], [name] rN

## 2018-08-04 NOTE — Progress Notes (Signed)
Physical Therapy Treatment Patient Details Name: Michelle Holt MRN: [id number] DOB: 23-Jun-1970 Today's Date: 08/04/2018    History of Present Illness 48 y.o. female with a history of anxiety depression, and insomnia. Pt admitted on 07/06/2018 for dyspnea and found to have left sided pleural effusion with compressive atelectasis and mediastinal shift and underwent IR thoracentesis and pigtail cathter placement 07/08/2018 and then subsequently VATS on 07/13/2018 w PleurX catheter.  Pt admitted on 07/18/2018 for symptomatic anemia with Hgb 4. EGD 07/20/2018-> multiple distal esophageal rings and a non-bleeding cratering gastric ulcer near the prepyloric area. Pt received transfusion and Hgb improved to 10.4 upon discharge.  Pt apparently had PET scan for evaluation of bilateral ovarian mass (CA [phone]) and sent to ER for splenic laceration.  Hgb 12.0  .  Pt notes has had LUQ pain but this has not increased. Admitted for splenic laceration.     PT Comments    Pt doing well.  Up walking - focus on safety, posture and decreasing abdominal strain with activity.  Talked about how to safely increase activity level at home.  All PT questions answered. Will follow while on acute.   Follow Up Recommendations  No PT follow up     Equipment Recommendations  None recommended by PT    Recommendations for Other Services       Precautions / Restrictions Precautions Precautions: None Precaution Comments: pleurx dressed by nursing prior to my visit - intact Restrictions Weight Bearing Restrictions: No    Mobility  Bed Mobility Overal bed mobility: Needs Assistance Bed Mobility: Supine to Sit     Supine to sit: Supervision     General bed mobility comments: pt given cues for technque - to decrease abdominal stress.  we talked about her adding a rail at home to help with up and down as she heals. she feels like this straining has limited her healing in the past  Transfers Overall transfer level: Needs  assistance Equipment used: None   Sit to Stand: Supervision         General transfer comment: increased effort, holding L side  Ambulation/Gait Ambulation/Gait assistance: Supervision Gait Distance (Feet): 300 Feet Assistive device: None Gait Pattern/deviations: WFL(Within Functional Limits)     General Gait Details: talked to pt about posture - standing erect.  we talked about ways to avoid straining when walking and doing ADLs at home   Stairs             Wheelchair Mobility    Modified Rankin (Stroke Patients Only)       Balance                                            Cognition Arousal/Alertness: Awake/alert Behavior During Therapy: WFL for tasks assessed/performed Overall Cognitive Status: Within Functional Limits for tasks assessed                                 General Comments: Focus on education of reducing abdominal strain - esp with bed mobitiy and discussing household ADLs/housekeeping.  pt educated on avoiding straining      Exercises      General Comments General comments (skin integrity, edema, etc.): pt irritated as she is going home with catheter and has to come back tomorrow to get removed.  focus  on reducing strain of abdomen for optimal healing      Pertinent Vitals/Pain Pain Assessment: 0-10 Pain Score: 6  Pain Location: left abdomen Pain Descriptors / Indicators: Sore;Aching;Guarding Pain Intervention(s): Limited activity within patient's tolerance;Repositioned;Monitored during session    Home Living                      Prior Function            PT Goals (current goals can now be found in the care plan section) Progress towards PT goals: Progressing toward goals    Frequency    Min 3X/week      PT Plan Current plan remains appropriate    Co-evaluation              AM-PAC PT "6 Clicks" Mobility   Outcome Measure  Help needed turning from your back to your side  while in a flat bed without using bedrails?: A Little Help needed moving from lying on your back to sitting on the side of a flat bed without using bedrails?: A Little Help needed moving to and from a bed to a chair (including a wheelchair)?: A Little Help needed standing up from a chair using your arms (e.g., wheelchair or bedside chair)?: A Little Help needed to walk in hospital room?: A Little Help needed climbing 3-5 steps with a railing? : A Little 6 Click Score: 18    End of Session   Activity Tolerance: Patient tolerated treatment well Patient left: in bed Nurse Communication: Mobility status PT Visit Diagnosis: Unsteadiness on feet (R26.81);Difficulty in walking, not elsewhere classified (R26.2);Pain     Time: 1130-1150 PT Time Calculation (min) (ACUTE ONLY): 20 min  Charges:  $Gait Training: 8-22 mins                     08/04/2018   Rande Lawman, PT    Loyal Buba 08/04/2018, 12:51 PM

## 2018-08-05 ENCOUNTER — Ambulatory Visit: Payer: Self-pay | Admitting: Cardiothoracic Surgery

## 2018-08-05 LAB — FUNGUS CULTURE WITH STAIN

## 2018-08-05 LAB — FUNGAL ORGANISM REFLEX

## 2018-08-05 LAB — FUNGUS CULTURE RESULT

## 2018-08-07 ENCOUNTER — Telehealth: Payer: Self-pay

## 2018-08-07 NOTE — Telephone Encounter (Signed)
Verdis Frederickson, RN with [facility] [phone] contacted the office requesting verbal orders for frequency of PleurX drainage.  Advised Dr. Prescott Gum requesting drainage scheduled of once weekly on Monday's.  She acknowledged receipt.  Will await faxed hard copy for physician signature.

## 2018-08-11 ENCOUNTER — Other Ambulatory Visit: Payer: Self-pay

## 2018-08-11 ENCOUNTER — Inpatient Hospital Stay (HOSPITAL_BASED_OUTPATIENT_CLINIC_OR_DEPARTMENT_OTHER): Payer: Medicaid Other | Admitting: Hematology

## 2018-08-11 ENCOUNTER — Encounter (HOSPITAL_COMMUNITY): Payer: Self-pay | Admitting: Hematology

## 2018-08-11 DIAGNOSIS — K5900 Constipation, unspecified: Secondary | ICD-10-CM | POA: Diagnosis not present

## 2018-08-11 DIAGNOSIS — C562 Malignant neoplasm of left ovary: Secondary | ICD-10-CM

## 2018-08-11 DIAGNOSIS — R5383 Other fatigue: Secondary | ICD-10-CM

## 2018-08-11 DIAGNOSIS — R0789 Other chest pain: Secondary | ICD-10-CM | POA: Diagnosis not present

## 2018-08-11 DIAGNOSIS — R0609 Other forms of dyspnea: Secondary | ICD-10-CM

## 2018-08-11 DIAGNOSIS — D735 Infarction of spleen: Secondary | ICD-10-CM

## 2018-08-11 DIAGNOSIS — C563 Malignant neoplasm of bilateral ovaries: Secondary | ICD-10-CM

## 2018-08-11 DIAGNOSIS — J90 Pleural effusion, not elsewhere classified: Secondary | ICD-10-CM | POA: Diagnosis not present

## 2018-08-11 DIAGNOSIS — R079 Chest pain, unspecified: Secondary | ICD-10-CM | POA: Diagnosis not present

## 2018-08-11 DIAGNOSIS — Z720 Tobacco use: Secondary | ICD-10-CM | POA: Diagnosis not present

## 2018-08-11 DIAGNOSIS — R110 Nausea: Secondary | ICD-10-CM

## 2018-08-11 DIAGNOSIS — C561 Malignant neoplasm of right ovary: Secondary | ICD-10-CM | POA: Diagnosis not present

## 2018-08-11 DIAGNOSIS — Z803 Family history of malignant neoplasm of breast: Secondary | ICD-10-CM | POA: Diagnosis not present

## 2018-08-11 DIAGNOSIS — Z87891 Personal history of nicotine dependence: Secondary | ICD-10-CM

## 2018-08-11 MED ORDER — OXYCODONE HCL 10 MG PO TABS: 10.0000 mg | ORAL_TABLET | ORAL | 0 refills | Status: DC | PRN

## 2018-08-11 NOTE — Progress Notes (Signed)
PATIENT: Michelle Holt DOB: October 13, 1970  REASON FOR VISIT: follow up HISTORY FROM: patient  HISTORY OF PRESENT ILLNESS: Today 08/12/18   Michelle Holt is a 48 year old female with history of intractable migraine headache.  In the past she has tried Botox, Aimovig, Effexor, Depakote, nortriptyline, Topamax.  She remains on oxycodone, diazepam, Phenergan, and Imitrex for headaches.  As of recent, she has been diagnosed with stage IV ovarian cancer.  She has decided to undergo chemotherapy.  She has been in the hospital for several weeks earlier this month, with hemoglobin of 4, she had Pleurx catheter for pleural effusion consistent with adenocarcinoma, and she had an incidental splenic hematoma that was embolized.  She is likely going to need a hysterectomy.  She says she has continued to have migraines.  She says on average she may have 7-10 headaches a month.  When she feels a headache coming on, she will take Imitrex and that is helpful to abate the headache.  She no longer has health insurance.  Her oncologist recently prescribed oxycodone 10 mg for pain.  In the past, Dr. Jannifer Franklin has prescribed Percocet 7.5/325 mg for migraines.  Our office is prescribing diazepam for anxiety.  She is no longer working, prior to her recent cancer diagnosis she had just started working at [employer].  She is currently trying to get disability.  She presents today for follow-up unaccompanied.   HISTORY 02/11/2018 Dr. Jannifer Franklin: Michelle Holt is a 48 year old right-handed white female with a history of intractable migraine headache.  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  She has in the past not tolerated Botox due to increased headaches and facial swelling.  The patient did not gain benefit from [place].  She was recently placed on Effexor, this did not help, she has gone off of her  Depakote.  The reduced stress from her work has improved her headache frequency.  The patient has had some left hip discomfort with weightbearing over the last 18 months, the pain radiates into the left groin area.  She feels better with sitting or lying down.  The patient is using Imitrex and the Phenergan if needed.  She returns to this office for an evaluation.  REVIEW OF SYSTEMS: Out of a complete 14 system review of symptoms, the patient complains only of the following symptoms, and all other reviewed systems are negative.  Headache  ALLERGIES: Allergies  Allergen Reactions  . Morphine And Related Itching  . Nortriptyline Other (See Comments)    Significant weight gain  . Topamax [Topiramate] Diarrhea    nausea  . Actifed Cold-Allergy [Chlorpheniramine-Phenyleph Er] Rash  . Amoxicillin Rash    Did it involve swelling of the face/tongue/throat, SOB, or low BP? Unknown Did it involve sudden or severe rash/hives, skin peeling, or any reaction on the inside of your mouth or nose? Unknown Did you need to seek medical attention at a hospital or doctor's office? Unknown When did it last happen? teenager If all above answers are "NO", may proceed with cephalosporin use.   . Codeine Hives  . Erythromycin Rash  . Penicillins Rash    Did it involve swelling of the face/tongue/throat, SOB, or low BP? Unknown Did it involve sudden or severe rash/hives, skin peeling, or any reaction on the inside of your mouth or nose? Unknown Did  you need to seek medical attention at a hospital or doctor's office? Unknown When did it last happen? teenager If all above answers are "NO", may proceed with cephalosporin use.   [name] [Pseudoephedrine Hcl] Rash    HOME MEDICATIONS: Outpatient Medications Prior to Visit  Medication Sig Dispense Refill  . benzonatate (TESSALON PERLES) 100 MG capsule Take 1 capsule (100 mg total) by mouth 3 (three) times daily as needed for cough. 30 capsule 0  .  diazepam (VALIUM) 5 MG tablet TAKE ONE TABLET BY MOUTH AT BEDTIME AS NEEDED FOR ANXIETY 30 tablet 5  . docusate sodium (COLACE) 100 MG capsule Take 1 capsule (100 mg total) by mouth 2 (two) times daily. 60 capsule 2  . gabapentin (NEURONTIN) 300 MG capsule Take 1 capsule (300 mg total) by mouth 3 (three) times daily. 90 capsule 0  . Oxycodone HCl 10 MG TABS Take 1 tablet (10 mg total) by mouth every 4 (four) hours as needed. 84 tablet 0  . pantoprazole (PROTONIX) 40 MG tablet Take 1 tablet (40 mg total) by mouth 2 (two) times daily before a meal. 60 tablet 2  . polyethylene glycol (MIRALAX / GLYCOLAX) 17 g packet Take 17 g by mouth as needed.    . promethazine (PHENERGAN) 25 MG tablet Take 1 tablet (25 mg total) by mouth every 6 (six) hours as needed for nausea, vomiting or refractory nausea / vomiting. 30 tablet 0  . senna-docusate (SENOKOT-S) 8.6-50 MG tablet Take 1 tablet by mouth at bedtime. 30 tablet 0  . SUMAtriptan (IMITREX) 100 MG tablet TAKE ONE TABLET BY MOUTH PRN UP to TWICE DAILY AS NEEDED. (Patient taking differently: Take 100 mg by mouth 2 (two) times daily as needed for migraine. ) 9 tablet 5   No facility-administered medications prior to visit.     PAST MEDICAL HISTORY: Past Medical History:  Diagnosis Date  . Anxiety and depression   . Arthritis of facet joints at multiple vertebral levels    L5-S1  . Constipation   . Dyslipidemia   . Family history of breast cancer   . Family history of uterine cancer   . Insomnia   . Irritable bowel syndrome   . Migraine   . Muscle tension headache   . Ovarian carcinoma ([place])   . Plantar fasciitis of right foot     PAST SURGICAL HISTORY: Past Surgical History:  Procedure Laterality Date  . CHOLECYSTECTOMY  2008  . COLONOSCOPY N/A 08/13/2013   Procedure: COLONOSCOPY;  Surgeon: Rogene Houston, MD;  [facility];  Service: Endoscopy;  Laterality: N/A;  230-moved to 145 Ann to notify pt  . ESOPHAGOGASTRODUODENOSCOPY     . ESOPHAGOGASTRODUODENOSCOPY (EGD) WITH PROPOFOL N/A 07/20/2018   Procedure: ESOPHAGOGASTRODUODENOSCOPY (EGD) WITH PROPOFOL;  Surgeon: Rogene Houston, MD;  [facility];  Service: Endoscopy;  Laterality: N/A;  Possible esophageal dilation.  . IR ANGIOGRAM SELECTIVE EACH ADDITIONAL VESSEL  08/01/2018  . IR ANGIOGRAM VISCERAL SELECTIVE  08/01/2018  . IR EMBO ART  VEN HEMORR LYMPH EXTRAV  INC GUIDE ROADMAPPING  08/01/2018  . IR PERC PLEURAL DRAIN W/INDWELL CATH W/IMG GUIDE  07/08/2018  . IR THORACENTESIS ASP PLEURAL SPACE W/IMG GUIDE  07/07/2018  . IR US GUIDE VASC ACCESS RIGHT  08/01/2018  . PLEURAL EFFUSION DRAINAGE Left 07/13/2018   Procedure: DRAINAGE OF LOCULATED PLEURAL EFFUSION;  Surgeon: Ivin Poot, MD;  Location: [facility];  Service: Thoracic;  Laterality: Left;  . TALC PLEURODESIS Left 07/13/2018  Procedure: [place] Pleuradesis;  Surgeon: Prescott Gum, Collier Salina, MD;  Location: [facility];  Service: Thoracic;  Laterality: Left;  . TUBAL LIGATION Bilateral   . UTERINE ABLATION    . VIDEO ASSISTED THORACOSCOPY Left 07/13/2018   Procedure: VIDEO ASSISTED THORACOSCOPY;  Surgeon: Ivin Poot, MD;  Location: [facility];  Service: Thoracic;  Laterality: Left;    FAMILY HISTORY: Family History  Problem Relation Age of Onset  . Hypertension Mother   . Obesity Mother   . Diabetes Mother   . Kidney disease Mother   . Peripheral vascular disease Father   . Atrial fibrillation Father   . COPD Brother   . Osteoporosis Brother   . Crohn's disease Sister   . Uterine cancer Sister 73       maternal half sister  . Breast cancer Maternal Aunt 13  . Colon cancer Neg Hx     SOCIAL HISTORY: Social History   Socioeconomic History  . Marital status: Widowed    Spouse name: Not on file  . Number of children: 2  . Years of education: 2-College  . Highest education level: Not on file  Occupational History    Employer: [employer]  . Financial resource strain: Not hard at all  . Food  insecurity    Worry: Never true    Inability: Never true  . Transportation needs    Medical: No    Non-medical: No  Tobacco Use  . Smoking status: Former Smoker    Packs/day: 0.50    Years: 17.00    Pack years: 8.50    Types: Cigarettes    Quit date: 06/22/2018    Years since quitting: 0.1  . Smokeless tobacco: Never Used  Substance and Sexual Activity  . Alcohol use: Yes    Alcohol/week: 1.0 standard drinks    Types: 1 Glasses of wine per week    Comment: Drinks alcohol on occasion  . Drug use: No  . Sexual activity: Not on file  Lifestyle  . Physical activity    Days per week: 0 days    Minutes per session: 0 min  . Stress: Very much  Relationships  . Social connections    Talks on phone: More than three times a week    Gets together: More than three times a week    Attends religious service: 1 to 4 times per year    Active member of club or organization: No    Attends meetings of clubs or organizations: Never    Relationship status: Widowed  . Intimate partner violence    Fear of current or ex partner: No    Emotionally abused: No    Physically abused: No    Forced sexual activity: No  Other Topics Concern  . Not on file  Social History Narrative   Patient lives at home with her daughter.    Patient has 2 children.    Patient is widowed.    Patient is right handed.    Patient has her Associates degree.       PHYSICAL EXAM  Vitals:   08/12/18 0745  BP: 110/68  Pulse: 90  Temp: 98.2 F (36.8 C)  Weight: 160 lb 6.4 oz (72.8 kg)  Height: 5\' 6"  (1.676 m)   Body mass index is 25.89 kg/m.  Generalized: Well developed, in no acute distress   Neurological examination  Mentation: Alert oriented to time, place, history taking. Follows all commands speech and language fluent Cranial nerve II-XII:  Pupils were equal round reactive to light. Extraocular movements were full, visual field were full on confrontational test. Facial sensation and strength were  normal. Head turning and shoulder shrug  were normal and symmetric. Motor: The motor testing reveals 5 over 5 strength of all 4 extremities. Good symmetric motor tone is noted throughout.  Sensory: Sensory testing is intact to soft touch on all 4 extremities. No evidence of extinction is noted.  Coordination: Cerebellar testing reveals good finger-nose-finger and heel-to-shin bilaterally.  Gait and station: Gait is normal. Tandem gait is normal. Romberg is negative. No drift is seen.  Reflexes: Deep tendon reflexes are symmetric and normal bilaterally.   DIAGNOSTIC DATA (LABS, IMAGING, TESTING) - I reviewed patient records, labs, notes, testing and imaging myself where available.  Lab Results  Component Value Date   WBC 10.0 08/02/2018   HGB 12.6 08/02/2018   HCT 38.8 08/02/2018   MCV 93.7 08/02/2018   PLT 384 08/02/2018      Component Value Date/Time   NA 134 (L) 08/02/2018 1019   K 4.8 08/02/2018 1019   CL 100 08/02/2018 1019   CO2 22 08/02/2018 1019   GLUCOSE 97 08/02/2018 1019   BUN <5 (L) 08/02/2018 1019   CREATININE 0.75 08/02/2018 1019   CALCIUM 9.1 08/02/2018 1019   PROT 7.1 07/31/2018 1500   ALBUMIN 2.7 (L) 08/02/2018 1019   AST 25 07/31/2018 1500   ALT 27 07/31/2018 1500   ALKPHOS 138 (H) 07/31/2018 1500   BILITOT 0.9 07/31/2018 1500   GFRNONAA >60 08/02/2018 1019   GFRAA >60 08/02/2018 1019   No results found for: CHOL, HDL, LDLCALC, LDLDIRECT, TRIG, CHOLHDL No results found for: HGBA1C No results found for: VITAMINB12 Lab Results  Component Value Date   TSH 1.492 07/08/2018      ASSESSMENT AND PLAN 48 y.o. year old female  has a past medical history of Anxiety and depression, Arthritis of facet joints at multiple vertebral levels, Constipation, Dyslipidemia, Family history of breast cancer, Family history of uterine cancer, Insomnia, Irritable bowel syndrome, Migraine, Muscle tension headache, Ovarian carcinoma ([place]), and Plantar fasciitis of right foot.  here with:  1.  Migraine headache  Unfortunately, she has recently been diagnosed with stage IV ovarian cancer. She will be undergoing chemotherapy in the near future. She has continued to have her typical migraine headaches, she reports she has about 7-10 a month.  She will take Imitrex with good relief.  Her oncologist has recently prescribed oxycodone 10 mg for pain.  She does have a Pleurx catheter at current.  In the past Dr. Jannifer Franklin has prescribed Percocet 7.5/325 mg for headaches.  In the past she has tried Botox, Aimovig, Effexor, Depakote, nortriptyline, Topamax, verapamil, and propanolol.  She does not have insurance at this time, she is pending disability.  We are rather limited on options for headache prevention, but in the future we may try Ajovy or Emgality.  Also, we may try baclofen for prevention.  She will follow-up in 6 months or sooner if needed.  I advised her that if her symptoms worsen or she if she develops any new symptoms she should let us know. I have sent a refill of Imitrex.   I spent 15 minutes with the patient. 50% of this time was spent discussing her plan of care.    Butler Denmark, AGNP-C, DNP 08/12/2018, 8:20 AM [facility] [address], [place], [place] [address] [phone]

## 2018-08-11 NOTE — Patient Instructions (Addendum)
[place] at [facility] Discharge Instructions  You were seen today by Dr. Delton Coombes. He went over your recent scan results. He will refer you to a GYN oncologist in [place] for further evaluation.  He will see you back in 3 weeks for follow up.   Thank you for choosing [facility] at [facility] to provide your oncology and hematology care.  To afford each patient quality time with our provider, please arrive at least 15 minutes before your scheduled appointment time.   If you have a lab appointment with the [facility] please come in thru the  Main Entrance and check in at the main information desk  You need to re-schedule your appointment should you arrive 10 or more minutes late.  We strive to give you quality time with our providers, and arriving late affects you and other patients whose appointments are after yours.  Also, if you no show three or more times for appointments you may be dismissed from the clinic at the providers discretion.     Again, thank you for choosing [facility].  Our hope is that these requests will decrease the amount of time that you wait before being seen by our physicians.       _____________________________________________________________  Should you have questions after your visit to [facility], please contact our office at [phone] between the hours of 8:00 a.m. and 4:30 p.m.  Voicemails left after 4:00 p.m. will not be returned until the following business day.  For prescription refill requests, have your pharmacy contact our office and allow 72 hours.    Cancer Center Support Programs:   > Cancer Support Group  2nd Tuesday of the month 1pm-2pm, Journey Room

## 2018-08-11 NOTE — Progress Notes (Signed)
I met with patient and her daughter [place] during the visit with Dr. Delton Coombes. I was able to assist getting her in to see Dr. Denman George next week.  She will see her and then come back to Dr. Delton Coombes to talk about her decision on chemotherapy.  At this time patient wants to get all her options before making a decision.  She has my contact information and will call me should she have any questions or concerns.  All questions were answer to her satisfaction.

## 2018-08-11 NOTE — Progress Notes (Signed)
Michelle Holt, [place] [address]   CLINIC:  Medical Oncology/Hematology  PCP:  [name], MD [place] [address] [phone]   REASON FOR VISIT:  Follow-up for ovarian cancer, splenic hematoma.   BRIEF ONCOLOGIC HISTORY:  Oncology History  Ovarian cancer, bilateral ([place])  07/07/2018 Pathology Results   PLEURAL FLUID, LEFT (SPECIMEN 1 OF 1, COLLECTED 07/07/18): - MALIGNANT CELLS CONSISTENT WITH ADENOCARCINOMA - SEE COMMENT  Source Pleural Fluid, (Specimen 1 of 1, collected on 07/07/2018) Gross Specimen: Received is/are 1000cc of bloody red fluid with tissue. (TC:tc) Prepared: # Smears: 0 # Concentration Technique Slides (i.e. ThinPrep): 1 # Cell Block: 1 Conventional Additional Studies: Two Hematology slides labeled [id number] Comment The malignant cells are positive for cytokeratin 7, p53, WT-1, Pax-8, Moc31, ER (weak) and EMA but negative for cytokeratin 20, TTF-1, GATA-3, CDX-2 and D2-40. Overall, the phenotype is consistent with a gynecologic primary; clinical correlation recommended.   07/07/2018 Procedure   Successful ultrasound guided left thoracentesis yielding 2.0 L of pleural fluid   07/08/2018 Procedure   1. Technically successful placement of left 14 French pigtail chest drain, placed to Pleur-evac water-seal.   07/08/2018 Procedure   1. Technically successful five [place] double lumen power injectable PICC placement   07/09/2018 Imaging   Ct chest 1. There is a moderate, loculated left hydropneumothorax with a small air component and moderate fluid component. The largest loculated component is located posteriorly. There is a pigtail drainage catheter about the lateral pleural space. There is no obvious etiology, such as obvious mass or pleural disease.   2. There is a small right pleural effusion with associated atelectasis or consolidation and a subpleural consolidation of the superior segment right lower lobe (series  4, image 56), of uncertain significance, possibly infectious or inflammatory   07/10/2018 Imaging   Ct abdomen and pelvis: 1. The bilateral ovaries are enlarged by heterogeneous appearing cystic lesions, measuring 5.3 x 4.2 cm on the right (series 4, image 72) and 4.5 x 3.2 cm on the left (series 4, image 75). Consider dedicated pelvic ultrasound and/or pelvic MRI to further evaluate for solid components given high suspicion for GYN primary malignancy.   2. No other evidence of mass and no lymphadenopathy in the abdomen or pelvis.   3. Trace ascites. There is some suggestion of omental and peritoneal nodularity (e.g. Series 4, image 40), concerning for peritoneal metastatic disease.    4. Loculated left-sided pleural effusion with left-sided pleural drainage catheter in position. Small right pleural effusion   07/13/2018 Surgery   OPERATION: 1.  Left VATS (video-assisted thoracoscopic surgery) for drainage of loculated pleural effusion. 2.  Talc pleurodesis for malignant pleural effusion. 3.  Placement of PleurX catheter for management of malignant pleural effusion. 4.  Placement of On-Q analgesia catheter system.    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   07/13/2018 Pathology Results   Pleura, peel, Left Pleural - FIBRO-FIBRINOUS PLEURITIS - NEGATIVE FOR MALIGNANCY   07/18/2018 Initial Diagnosis   Ovarian cancer, bilateral ([place])   07/20/2018 Procedure   EGD impression: Normal proximal esophagus and mid esophagus. Mild distal esophageal rings; dilation not performed because of esophagitis. LA Grade C reflux esophagitis. Z-line regular, 30 cm from the incisors. 5 cm hiatal hernia. Non-bleeding gastric ulcer with no stigmata of bleeding. Gastritis.  Duodenal erosions without bleeding. Normal  second portion of the duodenum. No specimens collected.      CANCER STAGING: Cancer Staging No matching staging information was found for the patient.   INTERVAL HISTORY:  Michelle Holt 48 y.o. female seen for follow-up of her ovarian cancer and splenic hematoma.  She was admitted to [facility] from 07/31/2018 through 08/04/2018 with splenic hematoma.  Coil obliteration of the splenic artery was done on 08/01/2018.  She still continuing to have left chest wall pain.  She has a Pleurx catheter which is not putting out any fluid.  She is taking oxycodone 10 mg every 4 hours and requests refill for it.  Shortness of breath on exertion is stable.  Fatigue is also stable.  She does report some nausea after eating.  Appetite is 50%.  Energy levels are low.  Pain is reported as 7/10.  No fevers or chills reported.    REVIEW OF SYSTEMS:  Review of Systems  Constitutional: Positive for fatigue.  Respiratory: Positive for shortness of breath.   Cardiovascular: Positive for chest pain.  Gastrointestinal: Positive for nausea.  All other systems reviewed and are negative.    PAST MEDICAL/SURGICAL HISTORY:  Past Medical History:  Diagnosis Date  . Anxiety and depression   . Arthritis of facet joints at multiple vertebral levels    L5-S1  . Constipation   . Dyslipidemia   . Family history of breast cancer   . Family history of uterine cancer   . Insomnia   . Irritable bowel syndrome   . Migraine   . Muscle tension headache   . Ovarian carcinoma ([place])   . Plantar fasciitis of right foot    Past Surgical History:  Procedure Laterality Date  . CHOLECYSTECTOMY  2008  . COLONOSCOPY N/A 08/13/2013   Procedure: COLONOSCOPY;  Surgeon: Rogene Houston, MD;  [facility];  Service: Endoscopy;  Laterality: N/A;  230-moved to 145 Ann to notify pt  . ESOPHAGOGASTRODUODENOSCOPY    . ESOPHAGOGASTRODUODENOSCOPY (EGD) WITH PROPOFOL N/A 07/20/2018   Procedure: ESOPHAGOGASTRODUODENOSCOPY (EGD)  WITH PROPOFOL;  Surgeon: Rogene Houston, MD;  [facility];  Service: Endoscopy;  Laterality: N/A;  Possible esophageal dilation.  . IR ANGIOGRAM SELECTIVE EACH ADDITIONAL VESSEL  08/01/2018  . IR ANGIOGRAM VISCERAL SELECTIVE  08/01/2018  . IR EMBO ART  VEN HEMORR LYMPH EXTRAV  INC GUIDE ROADMAPPING  08/01/2018  . IR PERC PLEURAL DRAIN W/INDWELL CATH W/IMG GUIDE  07/08/2018  . IR THORACENTESIS ASP PLEURAL SPACE W/IMG GUIDE  07/07/2018  . IR US GUIDE VASC ACCESS RIGHT  08/01/2018  . PLEURAL EFFUSION DRAINAGE Left 07/13/2018   Procedure: DRAINAGE OF LOCULATED PLEURAL EFFUSION;  Surgeon: Ivin Poot, MD;  Location: [place];  Service: Thoracic;  Laterality: Left;  . TALC PLEURODESIS Left 07/13/2018   Procedure: Talc Pleuradesis;  Surgeon: Prescott Gum, Collier Salina, MD;  Location: [place];  Service: Thoracic;  Laterality: Left;  . TUBAL LIGATION Bilateral   . UTERINE ABLATION    . VIDEO ASSISTED THORACOSCOPY Left 07/13/2018   Procedure: VIDEO ASSISTED THORACOSCOPY;  Surgeon: Ivin Poot, MD;  Location: [facility] OR;  Service: Thoracic;  Laterality: Left;     SOCIAL HISTORY:  Social History   Socioeconomic History  . Marital status: Widowed    Spouse name: Not on file  . Number of children: 2  . Years of education: 2-College  . Highest education level: Not on file  Occupational History    Employer: [employer]  Social Needs  . Financial resource strain: Not hard at all  . Food insecurity    Worry: Never true    Inability: Never true  . Transportation needs    Medical: No    Non-medical: No  Tobacco Use  . Smoking status: Former Smoker    Packs/day: 0.50    Years: 17.00    Pack years: 8.50    Types: Cigarettes    Quit date: 06/22/2018    Years since quitting: 0.1  . Smokeless tobacco: Never Used  Substance and Sexual Activity  . Alcohol use: Yes    Alcohol/week: 1.0 standard drinks    Types: 1 Glasses of wine per week    Comment: Drinks alcohol on occasion  . Drug use: No  . Sexual  activity: Not on file  Lifestyle  . Physical activity    Days per week: 0 days    Minutes per session: 0 min  . Stress: Very much  Relationships  . Social connections    Talks on phone: More than three times a week    Gets together: More than three times a week    Attends religious service: 1 to 4 times per year    Active member of club or organization: No    Attends meetings of clubs or organizations: Never    Relationship status: Widowed  . Intimate partner violence    Fear of current or ex partner: No    Emotionally abused: No    Physically abused: No    Forced sexual activity: No  Other Topics Concern  . Not on file  Social History Narrative   Patient lives at home with her daughter.    Patient has 2 children.    Patient is widowed.    Patient is right handed.    Patient has her Associates degree.       FAMILY HISTORY:  Family History  Problem Relation Age of Onset  . Hypertension Mother   . Obesity Mother   . Diabetes Mother   . Kidney disease Mother   . Peripheral vascular disease Father   . Atrial fibrillation Father   . COPD Brother   . Osteoporosis Brother   . Crohn's disease Sister   . Uterine cancer Sister 69       maternal half sister  . Breast cancer Maternal Aunt 69  . Colon cancer Neg Hx     CURRENT MEDICATIONS:  Outpatient Encounter Medications as of 08/11/2018  Medication Sig  . diazepam (VALIUM) 5 MG tablet TAKE ONE TABLET BY MOUTH AT BEDTIME AS NEEDED FOR ANXIETY  . docusate sodium (COLACE) 100 MG capsule Take 1 capsule (100 mg total) by mouth 2 (two) times daily.  Marland Kitchen gabapentin (NEURONTIN) 300 MG capsule Take 1 capsule (300 mg total) by mouth 3 (three) times daily.  . Oxycodone HCl 10 MG TABS Take 1 tablet (10 mg total) by mouth every 4 (four) hours as needed.  . pantoprazole (PROTONIX) 40 MG tablet Take 1 tablet (40 mg total) by mouth 2 (two) times daily before a meal.  . polyethylene glycol (MIRALAX / GLYCOLAX) 17 g packet Take 17 g by mouth  as needed.  . promethazine (PHENERGAN) 25 MG tablet Take 1 tablet (25 mg total) by mouth every 6 (six) hours as needed for nausea, vomiting or refractory nausea / vomiting.  . SUMAtriptan (IMITREX) 100 MG tablet TAKE ONE TABLET BY MOUTH PRN UP to TWICE DAILY AS NEEDED. (Patient taking differently: Take 100 mg by  mouth 2 (two) times daily as needed for migraine. )  . [DISCONTINUED] oxyCODONE 10 MG TABS Take 1 tablet (10 mg total) by mouth every 4 (four) hours as needed for severe pain or breakthrough pain.  . benzonatate (TESSALON PERLES) 100 MG capsule Take 1 capsule (100 mg total) by mouth 3 (three) times daily as needed for cough. (Patient not taking: Reported on 07/31/2018)  . senna-docusate (SENOKOT-S) 8.6-50 MG tablet Take 1 tablet by mouth at bedtime. (Patient not taking: Reported on 08/11/2018)   No facility-administered encounter medications on file as of 08/11/2018.     ALLERGIES:  Allergies  Allergen Reactions  . Morphine And Related Itching  . Nortriptyline Other (See Comments)    Significant weight gain  . Topamax [Topiramate] Diarrhea    nausea  . Actifed Cold-Allergy [Chlorpheniramine-Phenyleph Er] Rash  . Amoxicillin Rash    Did it involve swelling of the face/tongue/throat, SOB, or low BP? Unknown Did it involve sudden or severe rash/hives, skin peeling, or any reaction on the inside of your mouth or nose? Unknown Did you need to seek medical attention at a hospital or doctor's office? Unknown When did it last happen? teenager If all above answers are "NO", may proceed with cephalosporin use.   . Codeine Hives  . Erythromycin Rash  . Penicillins Rash    Did it involve swelling of the face/tongue/throat, SOB, or low BP? Unknown Did it involve sudden or severe rash/hives, skin peeling, or any reaction on the inside of your mouth or nose? Unknown Did you need to seek medical attention at a hospital or doctor's office? Unknown When did it last happen? teenager If  all above answers are "NO", may proceed with cephalosporin use.   [name] [Pseudoephedrine Hcl] Rash     PHYSICAL EXAM:  ECOG Performance status: 1  Vitals:   08/11/18 0949  BP: (!) 115/53  Pulse: (!) 104  Resp: 18  Temp: (!) 97.3 F (36.3 C)  SpO2: 100%   Filed Weights   08/11/18 0949  Weight: 158 lb (71.7 kg)    Physical Exam Vitals signs reviewed.  Constitutional:      Appearance: Normal appearance.  Cardiovascular:     Rate and Rhythm: Normal rate and regular rhythm.     Heart sounds: Normal heart sounds.  Pulmonary:     Effort: Pulmonary effort is normal.     Breath sounds: Normal breath sounds.     Comments: Decreased breath sounds at left lung base.  Left Pleurx catheter present. Abdominal:     General: There is no distension.     Palpations: Abdomen is soft. There is no mass.  Skin:    General: Skin is warm.  Neurological:     General: No focal deficit present.     Mental Status: She is alert and oriented to person, place, and time.  Psychiatric:        Mood and Affect: Mood normal.        Behavior: Behavior normal.      LABORATORY DATA:  I have reviewed the labs as listed.  CBC    Component Value Date/Time   WBC 10.0 08/02/2018 1019   RBC 4.14 08/02/2018 1019   HGB 12.6 08/02/2018 1019   HCT 38.8 08/02/2018 1019   PLT 384 08/02/2018 1019   MCV 93.7 08/02/2018 1019   MCH 30.4 08/02/2018 1019   MCHC 32.5 08/02/2018 1019   RDW 14.1 08/02/2018 1019   LYMPHSABS 1.4 08/02/2018 1019   MONOABS 1.2 (  H) 08/02/2018 1019   EOSABS 0.2 08/02/2018 1019   BASOSABS 0.1 08/02/2018 1019   CMP Latest Ref Rng & Units 08/02/2018 07/31/2018 07/21/2018  Glucose 70 - 99 mg/dL 97 95 116(H)  BUN 6 - 20 mg/dL <5(L) 9 8  Creatinine 0.44 - 1.00 mg/dL 0.75 0.63 0.65  Sodium 135 - 145 mmol/L 134(L) 136 135  Potassium 3.5 - 5.1 mmol/L 4.8 3.8 3.6  Chloride 98 - 111 mmol/L 100 101 99  CO2 22 - 32 mmol/L '22 25 26  '$ Calcium 8.9 - 10.3 mg/dL 9.1 9.0 8.3(L)  Total Protein  6.5 - 8.1 g/dL - 7.1 -  Total Bilirubin 0.3 - 1.2 mg/dL - 0.9 -  Alkaline Phos 38 - 126 U/L - 138(H) -  AST 15 - 41 U/L - 25 -  ALT 0 - 44 U/L - 27 -       DIAGNOSTIC IMAGING:  I have independently reviewed the scans and discussed with the patient.   I have reviewed Venita Lick LPN's note and agree with the documentation.  I personally performed a face-to-face visit, made revisions and my assessment and plan is as follows.    ASSESSMENT & PLAN:   Ovarian cancer, bilateral ([place]) 1.  Ovarian cancer, clinical stage IVa: - Presentation with shortness of breath on exertion in mid June, chest x-ray showed left pleural effusion. -Admitted to [facility] from 07/06/2018 through 07/16/2018, underwent left VATS, talc pleurodesis and placement of Pleurx catheter on 07/13/2018. - Cytology of the left pleural fluid consistent with adenocarcinoma.  Overall phenotype consistent with gynecological primary.  Left pleural biopsy shows fibrinous pleuritis. - Admitted to hospital with severe tiredness, found to have hemoglobin 4.1, received 4 units PRBC on 07/18/2018, discharged on 07/21/2018. -EGD on 07/20/2018 shows grade C reflux esophagitis, peptic ulcer disease, prepyloric ulcer with no active bleeding. - I reviewed PET scan results with the patient and her daughter in detail. -I discussed the normal treatment plan of her ovarian cancer with neoadjuvant chemotherapy followed by scanning after 3 cycles.  She was reluctant to consider chemotherapy at last visit.  But now she is slightly more receptive. -I will make a referral to Dr. Denman George. -I have also recommended germline mutation testing.  Maternal aunt had breast cancer. - When she is agreeable to chemotherapy, we will consider port placement.  2.  Left chest wall pain: -She has Pleurx catheter which is not putting out any fluid.  She will follow-up with Dr. Darcey Nora for Pleurx catheter removal. -She is taking oxycodone 10 mg every 4 hours as needed  which is controlling the pain.  Will send refill.  3.  Splenic hematoma: - This was picked up on the PET scan as incidental finding. -She was admitted to [facility] from 7/17 through 08/04/2018.  She underwent coil embolization of splenic artery on 08/01/2018.  Hemoglobin has been stable.   Total time spent is 40 minutes with more than 50% of the time spent face-to-face discussing scan results, treatment plan, counseling and coordination of care.  Orders placed this encounter:  No orders of the defined types were placed in this encounter.     Derek Jack, MD [place] [phone]  \

## 2018-08-11 NOTE — Assessment & Plan Note (Signed)
1.  Ovarian cancer, clinical stage IVa: - Presentation with shortness of breath on exertion in mid June, chest x-ray showed left pleural effusion. -Admitted to [facility] from 07/06/2018 through 07/16/2018, underwent left VATS, talc pleurodesis and placement of Pleurx catheter on 07/13/2018. - Cytology of the left pleural fluid consistent with adenocarcinoma.  Overall phenotype consistent with gynecological primary.  Left pleural biopsy shows fibrinous pleuritis. - Admitted to hospital with severe tiredness, found to have hemoglobin 4.1, received 4 units PRBC on 07/18/2018, discharged on 07/21/2018. -EGD on 07/20/2018 shows grade C reflux esophagitis, peptic ulcer disease, prepyloric ulcer with no active bleeding. - I reviewed PET scan results with the patient and her daughter in detail. -I discussed the normal treatment plan of her ovarian cancer with neoadjuvant chemotherapy followed by scanning after 3 cycles.  She was reluctant to consider chemotherapy at last visit.  But now she is slightly more receptive. -I will make a referral to Dr. Denman George. -I have also recommended germline mutation testing.  Maternal aunt had breast cancer. - When she is agreeable to chemotherapy, we will consider port placement.  2.  Left chest wall pain: -She has Pleurx catheter which is not putting out any fluid.  She will follow-up with Dr. Darcey Nora for Pleurx catheter removal. -She is taking oxycodone 10 mg every 4 hours as needed which is controlling the pain.  Will send refill.  3.  Splenic hematoma: - This was picked up on the PET scan as incidental finding. -She was admitted to [facility] from 7/17 through 08/04/2018.  She underwent coil embolization of splenic artery on 08/01/2018.  Hemoglobin has been stable.

## 2018-08-12 ENCOUNTER — Ambulatory Visit (INDEPENDENT_AMBULATORY_CARE_PROVIDER_SITE_OTHER): Payer: Medicaid Other | Admitting: Neurology

## 2018-08-12 ENCOUNTER — Encounter: Payer: Self-pay | Admitting: Neurology

## 2018-08-12 ENCOUNTER — Other Ambulatory Visit: Payer: Self-pay

## 2018-08-12 ENCOUNTER — Inpatient Hospital Stay (HOSPITAL_COMMUNITY): Payer: Medicaid Other

## 2018-08-12 VITALS — BP 110/68 | HR 90 | Temp 98.2°F | Ht 66.0 in | Wt 160.4 lb

## 2018-08-12 DIAGNOSIS — G43019 Migraine without aura, intractable, without status migrainosus: Secondary | ICD-10-CM

## 2018-08-12 MED ORDER — SUMATRIPTAN SUCCINATE 100 MG PO TABS: ORAL_TABLET | ORAL | 5 refills | Status: DC

## 2018-08-12 NOTE — Progress Notes (Signed)
I have read the note, and I agree with the clinical assessment and plan.  Charles K Willis   

## 2018-08-12 NOTE — Patient Instructions (Signed)
It was wonderful to meet you today. I wish you the best as you navigate the next few months. Please continue taking Imitrex as needed. Let me know when you establish insurance if you would like to try anything else.

## 2018-08-14 ENCOUNTER — Emergency Department (HOSPITAL_COMMUNITY): Payer: Medicaid Other

## 2018-08-14 ENCOUNTER — Emergency Department (HOSPITAL_COMMUNITY)
Admission: EM | Admit: 2018-08-14 | Discharge: 2018-08-14 | Disposition: A | Discharge status: Home / Self Care | Payer: Medicaid Other | Attending: Emergency Medicine | Admitting: Emergency Medicine

## 2018-08-14 ENCOUNTER — Other Ambulatory Visit: Payer: Self-pay

## 2018-08-14 ENCOUNTER — Telehealth (HOSPITAL_COMMUNITY): Payer: Self-pay | Admitting: *Deleted

## 2018-08-14 ENCOUNTER — Encounter (HOSPITAL_COMMUNITY): Payer: Self-pay | Admitting: *Deleted

## 2018-08-14 ENCOUNTER — Other Ambulatory Visit (HOSPITAL_COMMUNITY): Payer: Self-pay | Admitting: Nurse Practitioner

## 2018-08-14 DIAGNOSIS — C562 Malignant neoplasm of left ovary: Secondary | ICD-10-CM | POA: Insufficient documentation

## 2018-08-14 DIAGNOSIS — R102 Pelvic and perineal pain: Secondary | ICD-10-CM | POA: Diagnosis not present

## 2018-08-14 DIAGNOSIS — Z79899 Other long term (current) drug therapy: Secondary | ICD-10-CM | POA: Insufficient documentation

## 2018-08-14 DIAGNOSIS — Z87891 Personal history of nicotine dependence: Secondary | ICD-10-CM | POA: Insufficient documentation

## 2018-08-14 DIAGNOSIS — R1032 Left lower quadrant pain: Secondary | ICD-10-CM | POA: Diagnosis present

## 2018-08-14 LAB — URINALYSIS, ROUTINE W REFLEX MICROSCOPIC
Bilirubin Urine: NEGATIVE
Glucose, UA: NEGATIVE mg/dL
Ketones, ur: NEGATIVE mg/dL
Leukocytes,Ua: NEGATIVE
Nitrite: NEGATIVE
Protein, ur: NEGATIVE mg/dL
Specific Gravity, Urine: 1.008 (ref 1.005–1.030)
pH: 6 (ref 5.0–8.0)

## 2018-08-14 LAB — CBC WITH DIFFERENTIAL/PLATELET
Abs Immature Granulocytes: 0.05 10*3/uL (ref 0.00–0.07)
Basophils Absolute: 0.1 10*3/uL (ref 0.0–0.1)
Basophils Relative: 1 %
Eosinophils Absolute: 0.4 10*3/uL (ref 0.0–0.5)
Eosinophils Relative: 4 %
HCT: 44.8 % (ref 36.0–46.0)
Hemoglobin: 13.7 g/dL (ref 12.0–15.0)
Immature Granulocytes: 1 %
Lymphocytes Relative: 24 %
Lymphs Abs: 2.3 10*3/uL (ref 0.7–4.0)
MCH: 29 pg (ref 26.0–34.0)
MCHC: 30.6 g/dL (ref 30.0–36.0)
MCV: 94.9 fL (ref 80.0–100.0)
Monocytes Absolute: 0.9 10*3/uL (ref 0.1–1.0)
Monocytes Relative: 10 %
Neutro Abs: 5.7 10*3/uL (ref 1.7–7.7)
Neutrophils Relative %: 60 %
Platelets: 581 10*3/uL — ABNORMAL HIGH (ref 150–400)
RBC: 4.72 MIL/uL (ref 3.87–5.11)
RDW: 14.5 % (ref 11.5–15.5)
WBC: 9.3 10*3/uL (ref 4.0–10.5)
nRBC: 0 % (ref 0.0–0.2)

## 2018-08-14 LAB — COMPREHENSIVE METABOLIC PANEL
ALT: 27 U/L (ref 0–44)
AST: 36 U/L (ref 15–41)
Albumin: 3.2 g/dL — ABNORMAL LOW (ref 3.5–5.0)
Alkaline Phosphatase: 214 U/L — ABNORMAL HIGH (ref 38–126)
Anion gap: 9 (ref 5–15)
BUN: 8 mg/dL (ref 6–20)
CO2: 28 mmol/L (ref 22–32)
Calcium: 9.4 mg/dL (ref 8.9–10.3)
Chloride: 102 mmol/L (ref 98–111)
Creatinine, Ser: 0.7 mg/dL (ref 0.44–1.00)
GFR calc Af Amer: >60 mL/min (ref >60)
GFR calc non Af Amer: >60 mL/min (ref >60)
Glucose, Bld: 107 mg/dL — ABNORMAL HIGH (ref 70–99)
Potassium: 4.2 mmol/L (ref 3.5–5.1)
Sodium: 139 mmol/L (ref 135–145)
Total Bilirubin: 0.6 mg/dL (ref 0.3–1.2)
Total Protein: 7 g/dL (ref 6.5–8.1)

## 2018-08-14 LAB — POC URINE PREG, ED: Preg Test, Ur: NEGATIVE

## 2018-08-14 LAB — LIPASE, BLOOD: Lipase: 20 U/L (ref 11–51)

## 2018-08-14 MED ORDER — HYDROMORPHONE HCL 4 MG PO TABS: 2.0000 mg | ORAL_TABLET | ORAL | 0 refills | Status: DC | PRN

## 2018-08-14 MED ORDER — ONDANSETRON HCL 4 MG/2ML IJ SOLN
4.0000 mg | Freq: Once | INTRAMUSCULAR | Status: AC
  Administered 2018-08-14: 4 mg via INTRAVENOUS
  Filled 2018-08-14: qty 2

## 2018-08-14 MED ORDER — HYDROMORPHONE HCL 1 MG/ML IJ SOLN
1.0000 mg | Freq: Once | INTRAMUSCULAR | Status: AC
  Administered 2018-08-14: 1 mg via INTRAVENOUS
  Filled 2018-08-14: qty 1

## 2018-08-14 MED ORDER — HYDROMORPHONE HCL 1 MG/ML IJ SOLN
1.0000 mg | Freq: Once | INTRAMUSCULAR | Status: AC
  Administered 2018-08-14: 17:00:00 1 mg via INTRAVENOUS
  Filled 2018-08-14: qty 1

## 2018-08-14 NOTE — Discharge Instructions (Addendum)
Your labs and ultrasound are reassuring today - there appears to be no worsening of the ovarian tumor in size and no rupture.  You may take the dilaudid tablets in place of the oxycodone for better pain relief. Keep your appointment on Monday as planned, but get rechecked for any worsened symptoms should they occur over the weekend.  Do not drive within 4 hours of taking dilaudid as this will make you drowsy.

## 2018-08-14 NOTE — Telephone Encounter (Signed)
Received call from pt approximately 1100 - she states she had onset yesterday of new LLQ pain.  Reports pain is in her left ovary.  Describes this pain is sharp, and that it is severe when she stands or tries to walk, and that the pain is actually inhibiting her ability to ambulate.  States, "whenever I stand or try to walk it feels like something is ripping inside of me".  Notified K. Milinda Hirschfeld, NP.  She advises that pt should report to the ED for further evaluation and work-up as there could be multiple things, some serious, causing her pain.  Pt notified of this and agrees to go to [facility] ED for evaluation.

## 2018-08-14 NOTE — ED Triage Notes (Signed)
Pain in left lower quadrant, recently diagnosed with ovarian cancer, advised by cancer center to come in for evaluation of ovary

## 2018-08-14 NOTE — ED Provider Notes (Signed)
[facility] EMERGENCY DEPARTMENT Provider Note   CSN: [id number] Arrival date & time: 08/14/18  1238    History   Chief Complaint Chief Complaint  Patient presents with  . Abdominal Pain    HPI Michelle Holt is a 48 y.o. female with a history of anxiety and depression, dyslipidemia, IBS and recently diagnosed metastatic ovarian cancer presenting for evaluation of left pelvic pain which developed yesterday.  She states she was feeling well and perhaps overdid housework when she started to feel discomfort in her left lower pelvic region which has become severe over the course of yesterday evening.  She describes a sharp tearing sensation with movement which improves at rest but is constantly present.  She does take oxycodone which improves her pain but not completely since this new symptom began.  She denies fevers or chills, she has had nausea without emesis, denies bowel or bladder changes.  She contacted the cancer center this morning who advised she come here imaging to rule out ovarian rupture which can occur at this advanced stage.     The history is provided by the patient.  Abdominal Pain Associated symptoms: nausea   Associated symptoms: no chest pain, no chills, no fever, no hematuria, no shortness of breath, no sore throat and no vomiting     Past Medical History:  Diagnosis Date  . Anxiety and depression   . Arthritis of facet joints at multiple vertebral levels    L5-S1  . Constipation   . Dyslipidemia   . Family history of breast cancer   . Family history of uterine cancer   . Insomnia   . Irritable bowel syndrome   . Migraine   . Muscle tension headache   . Ovarian carcinoma ([place])   . Plantar fasciitis of right foot     Patient Active Problem List   Diagnosis Date Noted  . Splenic laceration 07/31/2018  . Family history of uterine cancer   . Family history of breast cancer   . Symptomatic anemia 07/18/2018  . Upper GI bleeding 07/18/2018  . Ovarian  cancer, bilateral ([place]) 07/18/2018  . HCAP (healthcare-associated pneumonia) 07/18/2018  . Pleural effusion 07/07/2018  . Migraine 07/06/2018  . Pleural effusion on left 07/06/2018  . Tobacco abuse 07/06/2018  . Generalized anxiety disorder 05/02/2014  . Unspecified constipation 08/10/2013  . Rectal bleeding 08/10/2013  . Lumbosacral spondylosis without myelopathy 10/23/2011  . Intractable migraine without aura 10/23/2011    Past Surgical History:  Procedure Laterality Date  . CHOLECYSTECTOMY  2008  . COLONOSCOPY N/A 08/13/2013   Procedure: COLONOSCOPY;  Surgeon: Rogene Houston, MD;  Location: [facility];  Service: Endoscopy;  Laterality: N/A;  230-moved to 145 Ann to notify pt  . ESOPHAGOGASTRODUODENOSCOPY    . ESOPHAGOGASTRODUODENOSCOPY (EGD) WITH PROPOFOL N/A 07/20/2018   Procedure: ESOPHAGOGASTRODUODENOSCOPY (EGD) WITH PROPOFOL;  Surgeon: Rogene Houston, MD;  Location: [facility];  Service: Endoscopy;  Laterality: N/A;  Possible esophageal dilation.  . IR ANGIOGRAM SELECTIVE EACH ADDITIONAL VESSEL  08/01/2018  . IR ANGIOGRAM VISCERAL SELECTIVE  08/01/2018  . IR EMBO ART  VEN HEMORR LYMPH EXTRAV  INC GUIDE ROADMAPPING  08/01/2018  . IR PERC PLEURAL DRAIN W/INDWELL CATH W/IMG GUIDE  07/08/2018  . IR THORACENTESIS ASP PLEURAL SPACE W/IMG GUIDE  07/07/2018  . IR US GUIDE VASC ACCESS RIGHT  08/01/2018  . PLEURAL EFFUSION DRAINAGE Left 07/13/2018   Procedure: DRAINAGE OF LOCULATED PLEURAL EFFUSION;  Surgeon: Ivin Poot, MD;  Location: [facility]  OR;  Service: Thoracic;  Laterality: Left;  . TALC PLEURODESIS Left 07/13/2018   Procedure: [place] Pleuradesis;  Surgeon: Prescott Gum, Collier Salina, MD;  Location: [place];  Service: Thoracic;  Laterality: Left;  . TUBAL LIGATION Bilateral   . UTERINE ABLATION    . VIDEO ASSISTED THORACOSCOPY Left 07/13/2018   Procedure: VIDEO ASSISTED THORACOSCOPY;  Surgeon: Ivin Poot, MD;  Location: [place];  Service: Thoracic;  Laterality: Left;     OB History   No  obstetric history on file.      Home Medications    Prior to Admission medications   Medication Sig Start Date End Date Taking? Authorizing Provider  diazepam (VALIUM) 5 MG tablet TAKE ONE TABLET BY MOUTH AT BEDTIME AS NEEDED FOR ANXIETY 07/27/18  Yes Kathrynn Ducking, MD  docusate sodium (COLACE) 100 MG capsule Take 1 capsule (100 mg total) by mouth 2 (two) times daily. 07/16/18 10/14/18 Yes Donne Hazel, MD  gabapentin (NEURONTIN) 300 MG capsule Take 1 capsule (300 mg total) by mouth 3 (three) times daily. 08/04/18  Yes Mercy Riding, MD  Oxycodone HCl 10 MG TABS Take 1 tablet (10 mg total) by mouth every 4 (four) hours as needed. 08/11/18  Yes Derek Jack, MD  pantoprazole (PROTONIX) 40 MG tablet Take 1 tablet (40 mg total) by mouth 2 (two) times daily before a meal. 07/21/18 08/20/18 Yes Shah, Pratik D, DO  polyethylene glycol (MIRALAX / GLYCOLAX) 17 g packet Take 17 g by mouth as needed.   Yes [provider]  promethazine (PHENERGAN) 25 MG tablet Take 1 tablet (25 mg total) by mouth every 6 (six) hours as needed for nausea, vomiting or refractory nausea / vomiting. 07/16/18  Yes Donne Hazel, MD  senna-docusate (SENOKOT-S) 8.6-50 MG tablet Take 1 tablet by mouth at bedtime. 07/16/18 08/15/18 Yes Donne Hazel, MD  SUMAtriptan (IMITREX) 100 MG tablet TAKE ONE TABLET BY MOUTH PRN UP to TWICE DAILY AS NEEDED. 08/12/18  Yes Suzzanne Cloud, NP  benzonatate (TESSALON PERLES) 100 MG capsule Take 1 capsule (100 mg total) by mouth 3 (three) times daily as needed for cough. Patient not taking: Reported on 08/14/2018 07/16/18 07/16/19  Donne Hazel, MD  HYDROmorphone (DILAUDID) 4 MG tablet Take 0.5-1 tablets (2-4 mg total) by mouth every 4 (four) hours as needed for severe pain. 08/14/18   Evalee Jefferson, PA-C    Family History Family History  Problem Relation Age of Onset  . Hypertension Mother   . Obesity Mother   . Diabetes Mother   . Kidney disease Mother   . Peripheral vascular  disease Father   . Atrial fibrillation Father   . COPD Brother   . Osteoporosis Brother   . Crohn's disease Sister   . Uterine cancer Sister 86       maternal half sister  . Breast cancer Maternal Aunt 24  . Colon cancer Neg Hx     Social History Social History   Tobacco Use  . Smoking status: Former Smoker    Packs/day: 0.50    Years: 17.00    Pack years: 8.50    Types: Cigarettes    Quit date: 06/22/2018    Years since quitting: 0.1  . Smokeless tobacco: Never Used  Substance Use Topics  . Alcohol use: Yes    Alcohol/week: 1.0 standard drinks    Types: 1 Glasses of wine per week    Comment: Drinks alcohol on occasion  . Drug use: No  Allergies   Morphine and related, Nortriptyline, Topamax [topiramate], Actifed cold-allergy [chlorpheniramine-phenyleph er], Amoxicillin, Codeine, Erythromycin, Penicillins, and Sudafed [pseudoephedrine hcl]   Review of Systems Review of Systems  Constitutional: Negative for chills and fever.  HENT: Negative for congestion and sore throat.   Eyes: Negative.   Respiratory: Negative for chest tightness and shortness of breath.   Cardiovascular: Negative for chest pain.  Gastrointestinal: Positive for nausea. Negative for abdominal pain and vomiting.  Genitourinary: Positive for pelvic pain. Negative for flank pain, frequency and hematuria.  Musculoskeletal: Negative for arthralgias, joint swelling and neck pain.  Skin: Negative.  Negative for rash and wound.  Neurological: Negative for dizziness, weakness, light-headedness, numbness and headaches.  Psychiatric/Behavioral: Negative.      Physical Exam Updated Vital Signs BP 107/85 (BP Location: Left Arm)   Pulse 88   Temp 98.1 F (36.7 C) (Oral)   Resp 20   Ht 5\' 6"  (1.676 m)   Wt 71.7 kg   SpO2 91%   BMI 25.50 kg/m   Physical Exam   ED Treatments / Results  Labs (all labs ordered are listed, but only abnormal results are displayed) Labs Reviewed  COMPREHENSIVE  METABOLIC PANEL - Abnormal; Notable for the following components:      Result Value   Glucose, Bld 107 (*)    Albumin 3.2 (*)    Alkaline Phosphatase 214 (*)    All other components within normal limits  CBC WITH DIFFERENTIAL/PLATELET - Abnormal; Notable for the following components:   Platelets 581 (*)    All other components within normal limits  URINALYSIS, ROUTINE W REFLEX MICROSCOPIC - Abnormal; Notable for the following components:   Hgb urine dipstick SMALL (*)    Bacteria, UA RARE (*)    All other components within normal limits  LIPASE, BLOOD  POC URINE PREG, ED    EKG None  Radiology US Pelvic Complete W Transvaginal And Torsion R/o  Result Date: 08/14/2018 CLINICAL DATA:  Left lower quadrant abdominal pain. Ovarian cancer. Recent PleurX catheter placement with resulting splenic laceration and splenic artery embolization. EXAM: TRANSABDOMINAL AND TRANSVAGINAL ULTRASOUND OF PELVIS DOPPLER ULTRASOUND OF OVARIES TECHNIQUE: Both transabdominal and transvaginal ultrasound examinations of the pelvis were performed. Transabdominal technique was performed for global imaging of the pelvis including uterus, ovaries, adnexal regions, and pelvic cul-de-sac. It was necessary to proceed with endovaginal exam following the transabdominal exam to visualize the endometrium and ovaries to better advantage. Color and duplex Doppler ultrasound was utilized to evaluate blood flow to the ovaries. COMPARISON:  PET/CT and abdominal CTA 07/31/2018. pelvic ultrasound 10/25/2014. FINDINGS: Uterus Measurements: 6.4 x 2.8 x 4.4 cm = volume: 40.6 mL. No myometrial abnormalities are identified. There are several small cervical nabothian cysts. Endometrium Thickness: 2 mm. There is a complex subendometrial cystic lesion posteriorly measuring 7 mm maximally. Right ovary Measurements: 5.3 x 4.7 x 4.8 cm = volume: 62.7 mL. Mildly complex cystic area measuring up to 4.2 x 3.9 x 4.1 cm. A small amount of surrounding  soft tissue with vascularity. Left ovary Measurements: 6.2 x 4.3 x 5.8 cm = volume: 79.9 mL. There is a large complex solid and cystic mass with associated vascularity. Pulsed Doppler evaluation of both ovaries demonstrates normal low-resistance arterial and venous waveforms. Other findings Small amount of free pelvic fluid. IMPRESSION: 1. Similar appearance of complex bilateral adnexal masses compared with previous imaging of 07/31/2018 and consistent with known ovarian cancer. 2. No evidence of ovarian torsion. 3. No significant uterine findings.  Small  subendometrial cyst. Electronically Signed   By: Richardean Sale M.D.   On: 08/14/2018 16:03    Procedures Procedures (including critical care time)  Medications Ordered in ED Medications  HYDROmorphone (DILAUDID) injection 1 mg (1 mg Intravenous Given 08/14/18 1501)  ondansetron (ZOFRAN) injection 4 mg (4 mg Intravenous Given 08/14/18 1501)  HYDROmorphone (DILAUDID) injection 1 mg (1 mg Intravenous Given 08/14/18 1714)     Initial Impression / Assessment and Plan / ED Course  I have reviewed the triage vital signs and the nursing notes.  Pertinent labs & imaging results that were available during my care of the patient were reviewed by me and considered in my medical decision making (see chart for details).        Pt with known metastatic ovarian cancer with increased LLQ pelvic pain.  [place] and labs reassuring with no signs of ovarian rupture or torsion.  Labs stable including hemoglobin.  WBC count normal range.  She was prescribed dilaudid po in place of her oxycodone, caution re sedation.  She has appt with gyn/onc at [facility] in 3 days.  Return precaution in the interim discussed.    Final Clinical Impressions(s) / ED Diagnoses   Final diagnoses:  Pelvic pain  Malignant neoplasm of left ovary [facility])    ED Discharge Orders         Ordered    HYDROmorphone (DILAUDID) 4 MG tablet  Every 4 hours PRN     08/14/18 1645           Evalee Jefferson, PA-C 08/14/18 1722    Maudie Flakes, MD 08/16/18 [phone]

## 2018-08-17 ENCOUNTER — Encounter: Payer: Self-pay | Admitting: Gynecologic Oncology

## 2018-08-17 ENCOUNTER — Encounter: Payer: Self-pay | Admitting: Oncology

## 2018-08-17 ENCOUNTER — Other Ambulatory Visit: Payer: Self-pay

## 2018-08-17 ENCOUNTER — Telehealth: Payer: Self-pay | Admitting: Oncology

## 2018-08-17 ENCOUNTER — Inpatient Hospital Stay: Payer: Medicaid Other | Attending: Gynecologic Oncology | Admitting: Gynecologic Oncology

## 2018-08-17 VITALS — BP 119/67 | HR 100 | Temp 97.7°F | Resp 18 | Ht 66.0 in | Wt 160.1 lb

## 2018-08-17 DIAGNOSIS — E785 Hyperlipidemia, unspecified: Secondary | ICD-10-CM | POA: Insufficient documentation

## 2018-08-17 DIAGNOSIS — C562 Malignant neoplasm of left ovary: Secondary | ICD-10-CM | POA: Diagnosis present

## 2018-08-17 DIAGNOSIS — Z803 Family history of malignant neoplasm of breast: Secondary | ICD-10-CM | POA: Insufficient documentation

## 2018-08-17 DIAGNOSIS — F419 Anxiety disorder, unspecified: Secondary | ICD-10-CM | POA: Insufficient documentation

## 2018-08-17 DIAGNOSIS — C782 Secondary malignant neoplasm of pleura: Secondary | ICD-10-CM | POA: Insufficient documentation

## 2018-08-17 DIAGNOSIS — M1990 Unspecified osteoarthritis, unspecified site: Secondary | ICD-10-CM | POA: Diagnosis not present

## 2018-08-17 DIAGNOSIS — F329 Major depressive disorder, single episode, unspecified: Secondary | ICD-10-CM | POA: Diagnosis not present

## 2018-08-17 DIAGNOSIS — K589 Irritable bowel syndrome without diarrhea: Secondary | ICD-10-CM | POA: Diagnosis not present

## 2018-08-17 DIAGNOSIS — C563 Malignant neoplasm of bilateral ovaries: Secondary | ICD-10-CM

## 2018-08-17 DIAGNOSIS — C561 Malignant neoplasm of right ovary: Secondary | ICD-10-CM | POA: Insufficient documentation

## 2018-08-17 NOTE — Progress Notes (Signed)
Met with Michelle Holt after her appointment with Dr. Denman George.  Discussed plan going forward per Dr. Denman George for baseline CT, 3 cycles of chemo followed by repeat imaging and possible debulking surgery followed by 3 more cycles of chemo.  She was given the [place] folder and was encouraged to call with any questions.

## 2018-08-17 NOTE — Progress Notes (Signed)
Consult Note: Gyn-Onc  Consult was requested by Dr. Delton Coombes for the evaluation of Michelle Holt 48 y.o. female  CC:  Chief Complaint  Patient presents with  . Ovarian cancer, bilateral [facility])    Assessment/Plan:  Michelle Holt  is a 48 y.o.  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 BRCA status pending.  I performed a history, physical examination, and personally reviewed the patient's imaging films including the CT scan from 07/10/18.  I discussed that I believe she likely has stage IIIC ovarian cancer. I discussed that the treatment approach for this disease is typically combination of cytoreductive surgery and chemotherapy. I discussed that sequencing of this can be either with upfront debulking followed by adjuvant chemotherapy sequentially or neoadjuvant chemotherapy followed by an interval cytoreductive attempt, then additional chemotherapy. This latter approach is associated with a reduced perioperative morbidity at the time of surgery. I discussed that the goal of optimal sequencing is to optimise the likelihood that cytoreductive effect can be optimal to less than 1 cm of residual disease, and would not induce morbidity for the patient that would result in a delay of adjuvant chemotherapy. I discussed that it is an individual decision process that takes into account individual patient health, and preference factors, in addition to the apparent tumor distribution on imaging. I discussed that the overall survival observed in patients is equivalent for both approaches provided that there is an optimal cytoreductive effort at the time of surgery (regardless of the timing of that surgery).  I am recommending neoadjuvant chemotherapy followed by interval cytoreduction as she has stage IV disease and there has been a delay in commencing therapy due to complications of thoracentesis.  We will obtain pretreatment baseline CT scans of the chest abdomen pelvis as there is  been significant interval delay since her prior scans.  I recommend 3 cycles of carboplatin paclitaxel chemotherapy q. 21 days.  Following the third dose we will obtain repeat CT scan of the chest abdomen and pelvis.  If there is been significant response and surgical debulking is appropriate with anticipated minimal morbidity or delay in restarting chemotherapy, we would take an intervening break for surgical cytoreduction.  I explained to the patient that following interval start cytoreductive surgery she would require at least an additional 3 cycles of chemotherapy.  She discussed with me the contemplation of no therapy as she is recently buried her husband from cancer.  I explained to her that while her disease is very extensive, typically we observe 80 to 90% response rates (complete response rates) 2 primary therapy for epithelial ovarian cancer.  I explained that despite these promising early responses, approximately three quarters of patients recur, and at that time treatment is typically palliative.  I explained that if she harbors a BRCA gene mutation, her prognosis may be even more favorable than the statistics suggest.  I explained given her young age and the high likelihood of a complete clinical response, I recommend aggressive therapy with carboplatin paclitaxel and surgery.  However, if she recurs, that might be a time in which she elects for a more palliative approach and avoid subsequent chemotherapy.  After considering her options she is agreeable to proceeding with treatment.  We will ensure that she has timely initiation of treatment.  I will see her back after cycle 3.  She is having repeat CT imaging as it has been >6 weeks since her initial CT imaging with no intervening therapy. This will serve as  baseline comparison.   HPI: Michelle Holt is a 48 year old P2 who is seen in consultation at the request of Dr.Katragadda for evaluation of stage IV ovarian cancer.  The patient's  history began in June 2020 when she experienced symptoms of shortness of breath.  She was seen and evaluated with a chest x-ray which showed bilateral pleural effusions and she underwent thoracentesis on July 07, 2018.  This revealed pleural fluid which was positive for adenocarcinoma favor GYN primary due to cytokeratin 7+ p53 positive WT 1+ PAX 8+ and weak ER positive.  CK20 was negative.  Post drainage she developed back pain.  A CT scan of the chest on July 09, 2018 which showed a moderate loculated left hydropneumothorax with a small air component and moderate fluid component.  The largest loculated component was located posteriorly.  There is a pigtail drainage catheter in the lateral space.  No obvious etiology was appreciated such as obvious mass or pleural disease.  There was also a small right pleural effusion with associated atelectasis.  It was of uncertain significance.  Due to the epigastric pain that she was continuing to experience, she underwent a CT scan of the abdomen and pelvis on July 10, 2018.  This revealed bilateral ovaries enlarged by heterogeneous appearing cystic lesions measuring 5.3 x 4.2 cm on the right, and 4.5 x 3.2 cm on the left.  No other masses, lymphadenopathy was seen.  There was trace ascites.  There was some suggestion of omental and peritoneal nodularity concerning for peritoneal metastatic disease.  She underwent VATS procedure and left pleural peeling.  This showed fibrinous pleuritis negative for malignancy.  This was performed on July 13, 2018.  CA 125 on 07/19/18 was elevated at 581.   An EGD was performed on July 20, 2018.  This showed a nonbleeding gastric ulcer, duodenal erosions without bleeding, and a normal esophagus.  It was not felt to be the cause of a worsening anemia that was diagnosed at that time.  A PET on 07/31/18 showed hypermetabolic tissue associated with cystic masses of the LEFT and RIGHT ovary consistent with ovarian carcinoma. Evidence of  carcinoma metastasis with rind of hypermetabolic tissue along the inferior margin of liver RIGHT hepatic lobe as well as on along the ventral LEFT peritoneal space and greater omentum. Hypermetabolic tissue in the LEFT pleural space is related to talc pleurodesis. Cannot exclude underlying malignancy. Incidental finding of a large hematoma in LEFT upper quadrant bound superiorly by the LEFT hemidiaphragm and inferiorly by the spleen.   She had persistent left upper quadrant and epigastric discomfort and was readmitted.  She was eventually diagnosed with a splenic laceration thought to be secondary to her thoracentesis or VATS procedure.  She underwent splenic artery embolization in July 2020.  Since that time she has had worsening left upper quadrant pains.  Of note a thoracentesis had been placed in June on the left.  It is remained in place since that time but stopped draining fluid in late July 2020.  She met with Dr.Katragadda who counseled her regarding the options of neoadjuvant chemotherapy followed by interval cytoreduction and additional chemotherapy.  The patient was initially cautious about undergoing aggressive chemotherapy as her husband died in 2016-04-13 from advanced malignancy and she was concerned about quality of life diminution.  Her past medical history is scanty as she has had infrequent medical care.  She does report a history of hypercholesterolemia.  She had 2 prior vaginal births.  She denies uterine procedures  other than an endometrial ablation for benign disease in 2005 and has been amenorrheic since that time.  She has had a cholecystectomy.  She had no other abdominal surgeries.  Her family history is significant for her being 1 of 12 children.  She has a maternal aunt who has a history of breast cancer, and a sister who was diagnosed with uterine cancer at age 35, she was overweight, it was a stage I disease.  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  She is not walking  while receiving treatment for her cancer.  Current Meds:  Outpatient Encounter Medications as of 08/17/2018  Medication Sig  . benzonatate (TESSALON PERLES) 100 MG capsule Take 1 capsule (100 mg total) by mouth 3 (three) times daily as needed for cough.  . diazepam (VALIUM) 5 MG tablet TAKE ONE TABLET BY MOUTH AT BEDTIME AS NEEDED FOR ANXIETY  . docusate sodium (COLACE) 100 MG capsule Take 1 capsule (100 mg total) by mouth 2 (two) times daily.  Marland Kitchen gabapentin (NEURONTIN) 300 MG capsule Take 1 capsule (300 mg total) by mouth 3 (three) times daily.  Marland Kitchen HYDROmorphone (DILAUDID) 4 MG tablet Take 0.5-1 tablets (2-4 mg total) by mouth every 4 (four) hours as needed for severe pain.  . pantoprazole (PROTONIX) 40 MG tablet Take 1 tablet (40 mg total) by mouth 2 (two) times daily before a meal.  . polyethylene glycol (MIRALAX / GLYCOLAX) 17 g packet Take 17 g by mouth as needed.  . promethazine (PHENERGAN) 25 MG tablet Take 1 tablet (25 mg total) by mouth every 6 (six) hours as needed for nausea, vomiting or refractory nausea / vomiting.  . SUMAtriptan (IMITREX) 100 MG tablet TAKE ONE TABLET BY MOUTH PRN UP to TWICE DAILY AS NEEDED.  Marland Kitchen Oxycodone HCl 10 MG TABS Take 1 tablet (10 mg total) by mouth every 4 (four) hours as needed. (Patient not taking: Reported on 08/17/2018)   No facility-administered encounter medications on file as of 08/17/2018.     Allergy:  Allergies  Allergen Reactions  . Morphine And Related Itching  . Nortriptyline Other (See Comments)    Significant weight gain  . Topamax [Topiramate] Diarrhea    nausea  . Actifed Cold-Allergy [Chlorpheniramine-Phenyleph Er] Rash  . Amoxicillin Rash    Did it involve swelling of the face/tongue/throat, SOB, or low BP? Unknown Did it involve sudden or severe rash/hives, skin peeling, or any reaction on the inside of your mouth or nose? Unknown Did you need to seek medical attention at a hospital or doctor's office? Unknown When did it last happen?  teenager If all above answers are "NO", may proceed with cephalosporin use.   . Codeine Hives  . Erythromycin Rash  . Penicillins Rash    Did it involve swelling of the face/tongue/throat, SOB, or low BP? Unknown Did it involve sudden or severe rash/hives, skin peeling, or any reaction on the inside of your mouth or nose? Unknown Did you need to seek medical attention at a hospital or doctor's office? Unknown When did it last happen? teenager If all above answers are "NO", may proceed with cephalosporin use.   [name] [Pseudoephedrine Hcl] Rash    Social Hx:   Social History   Socioeconomic History  . Marital status: Widowed    Spouse name: Not on file  . Number of children: 2  . Years of education: 2-College  . Highest education level: Not on file  Occupational History    Employer: [employer]  Social Needs  . Financial resource strain: Not hard at all  . Food insecurity    Worry: Never true    Inability: Never true  . Transportation needs    Medical: No    Non-medical: No  Tobacco Use  . Smoking status: Former Smoker    Packs/day: 0.50    Years: 17.00    Pack years: 8.50    Types: Cigarettes    Quit date: 06/22/2018    Years since quitting: 0.1  . Smokeless tobacco: Never Used  Substance and Sexual Activity  . Alcohol use: Yes    Alcohol/week: 1.0 standard drinks    Types: 1 Glasses of wine per week    Comment: Drinks alcohol on occasion  . Drug use: No  . Sexual activity: Not on file  Lifestyle  . Physical activity    Days per week: 0 days    Minutes per session: 0 min  . Stress: Very much  Relationships  . Social connections    Talks on phone: More than three times a week    Gets together: More than three times a week    Attends religious service: 1 to 4 times per year    Active member of club or organization: No    Attends meetings of clubs or organizations: Never    Relationship status: Widowed  . Intimate partner violence    Fear of  current or ex partner: No    Emotionally abused: No    Physically abused: No    Forced sexual activity: No  Other Topics Concern  . Not on file  Social History Narrative   Patient lives at home with her daughter.    Patient has 2 children.    Patient is widowed.    Patient is right handed.    Patient has her Associates degree.       Past Surgical Hx:  Past Surgical History:  Procedure Laterality Date  . CHOLECYSTECTOMY  2008  . COLONOSCOPY N/A 08/13/2013   Procedure: COLONOSCOPY;  Surgeon: Rogene Houston, MD;  [facility];  Service: Endoscopy;  Laterality: N/A;  230-moved to 145 Ann to notify pt  . ESOPHAGOGASTRODUODENOSCOPY    . ESOPHAGOGASTRODUODENOSCOPY (EGD) WITH PROPOFOL N/A 07/20/2018   Procedure: ESOPHAGOGASTRODUODENOSCOPY (EGD) WITH PROPOFOL;  Surgeon: Rogene Houston, MD;  [facility];  Service: Endoscopy;  Laterality: N/A;  Possible esophageal dilation.  . IR ANGIOGRAM SELECTIVE EACH ADDITIONAL VESSEL  08/01/2018  . IR ANGIOGRAM VISCERAL SELECTIVE  08/01/2018  . IR EMBO ART  VEN HEMORR LYMPH EXTRAV  INC GUIDE ROADMAPPING  08/01/2018  . IR PERC PLEURAL DRAIN W/INDWELL CATH W/IMG GUIDE  07/08/2018  . IR THORACENTESIS ASP PLEURAL SPACE W/IMG GUIDE  07/07/2018  . IR US GUIDE VASC ACCESS RIGHT  08/01/2018  . PLEURAL EFFUSION DRAINAGE Left 07/13/2018   Procedure: DRAINAGE OF LOCULATED PLEURAL EFFUSION;  Surgeon: Ivin Poot, MD;  Location: [place];  Service: Thoracic;  Laterality: Left;  . TALC PLEURODESIS Left 07/13/2018   Procedure: Talc Pleuradesis;  Surgeon: Prescott Gum, Collier Salina, MD;  Location: [place];  Service: Thoracic;  Laterality: Left;  . TUBAL LIGATION Bilateral   . UTERINE ABLATION    . VIDEO ASSISTED THORACOSCOPY Left 07/13/2018   Procedure: VIDEO ASSISTED THORACOSCOPY;  Surgeon: Ivin Poot, MD;  Location: [place];  Service: Thoracic;  Laterality: Left;    Past Medical Hx:  Past Medical History:  Diagnosis Date  . Anxiety and depression   .  Arthritis of facet joints at multiple vertebral levels    L5-S1  . Constipation   . Dyslipidemia   . Family history of breast cancer   . Family history of uterine cancer   . Insomnia   . Irritable bowel syndrome   . Migraine   . Muscle tension headache   . Ovarian carcinoma ([place])   . Plantar fasciitis of right foot     Past Gynecological History:  See HPI No LMP recorded. Patient has had an ablation.  Family Hx:  Family History  Problem Relation Age of Onset  . Hypertension Mother   . Obesity Mother   . Diabetes Mother   . Kidney disease Mother   . Peripheral vascular disease Father   . Atrial fibrillation Father   . COPD Brother   . Osteoporosis Brother   . Crohn's disease Sister   . Uterine cancer Sister 46       maternal half sister  . Breast cancer Maternal Aunt 17  . Colon cancer Neg Hx     Review of Systems:  Constitutional  Feels well,    ENT Normal appearing ears and nares bilaterally Skin/Breast  No rash, sores, jaundice, itching, dryness Cardiovascular  No chest pain, shortness of breath, or edema  Pulmonary  No cough or wheeze.  Gastro Intestinal  No nausea, vomitting, or diarrhoea. No bright red blood per rectum, no abdominal pain, change in bowel movement, or constipation.  Genito Urinary  No frequency, urgency, dysuria, no abnormal bleeding Musculo Skeletal  No myalgia, arthralgia, joint swelling or pain  Neurologic  No weakness, numbness, change in gait,  Psychology  No depression, anxiety, insomnia.   Vitals:  Blood pressure 119/67, pulse 100, temperature 97.7 F (36.5 C), temperature source Oral, resp. rate 18, height '5\' 6"'  (1.676 m), weight 160 lb 1.6 oz (72.6 kg), SpO2 96 %.  Physical Exam: WD in NAD Neck  Supple NROM, without any enlargements.  Lymph Node Survey No cervical supraclavicular or inguinal adenopathy Cardiovascular  Pulse normal rate, regularity and rhythm. S1 and S2 normal.  Lungs  Clear to auscultation bilateraly,  without wheezes/crackles/rhonchi. Good air movement.  Skin  No rash/lesions/breakdown  Psychiatry  Alert and oriented to person, place, and time  Abdomen  Normoactive bowel sounds, abdomen soft, non-tender and non without evidence of hernia.  Back No CVA tenderness Genito Urinary  Vulva/vagina: Normal external female genitalia.  No lesions. No discharge or bleeding.  Bladder/urethra:  No lesions or masses, well supported bladder  Vagina: normal  Cervix: Normal appearing, no lesions.  Uterus:  Small, mobile, no parametrial involvement or nodularity.  Adnexa: no palpable masses. Rectal  Good tone, no masses no cul de sac nodularity.  Extremities  No bilateral cyanosis, clubbing or edema.   Thereasa Solo, MD  08/17/2018, 9:57 AM

## 2018-08-17 NOTE — Telephone Encounter (Signed)
Called Michelle Holt and advised her that she can drink the water based contrast before her CT scan on 08/21/18.  She will just need to arrive at 12:15.  She verbalized understanding and agreement.

## 2018-08-17 NOTE — Patient Instructions (Signed)
Plan to have a CT of the chest, abdomen, and pelvis as a baseline before starting chemo. The recommendation is for three cycles of chemotherapy followed by imaging with consideration for surgery at that time if there has been a significant response from therapy.  Please call for any questions or concerns.

## 2018-08-18 ENCOUNTER — Other Ambulatory Visit (HOSPITAL_COMMUNITY): Payer: Self-pay | Admitting: *Deleted

## 2018-08-18 DIAGNOSIS — C561 Malignant neoplasm of right ovary: Secondary | ICD-10-CM

## 2018-08-18 DIAGNOSIS — C563 Malignant neoplasm of bilateral ovaries: Secondary | ICD-10-CM

## 2018-08-18 MED ORDER — HYDROMORPHONE HCL 4 MG PO TABS: 4.0000 mg | ORAL_TABLET | ORAL | 0 refills | Status: DC | PRN

## 2018-08-18 NOTE — Progress Notes (Signed)
Orders placed for IR port a cath placement.

## 2018-08-19 ENCOUNTER — Ambulatory Visit (INDEPENDENT_AMBULATORY_CARE_PROVIDER_SITE_OTHER): Payer: Self-pay | Admitting: Cardiothoracic Surgery

## 2018-08-19 ENCOUNTER — Encounter (HOSPITAL_COMMUNITY): Payer: Self-pay | Admitting: Hematology

## 2018-08-19 ENCOUNTER — Encounter: Payer: Self-pay | Admitting: Cardiothoracic Surgery

## 2018-08-19 ENCOUNTER — Inpatient Hospital Stay (HOSPITAL_COMMUNITY): Payer: Medicaid Other | Attending: Hematology | Admitting: Hematology

## 2018-08-19 ENCOUNTER — Other Ambulatory Visit: Payer: Self-pay | Admitting: Cardiothoracic Surgery

## 2018-08-19 ENCOUNTER — Other Ambulatory Visit: Payer: Self-pay | Admitting: *Deleted

## 2018-08-19 ENCOUNTER — Ambulatory Visit
Admission: RE | Admit: 2018-08-19 | Discharge: 2018-08-19 | Disposition: A | Discharge status: Home / Self Care | Payer: Self-pay | Source: Ambulatory Visit | Attending: Cardiothoracic Surgery | Admitting: Cardiothoracic Surgery

## 2018-08-19 ENCOUNTER — Other Ambulatory Visit: Payer: Self-pay | Admitting: Radiology

## 2018-08-19 ENCOUNTER — Other Ambulatory Visit: Payer: Self-pay

## 2018-08-19 VITALS — BP 124/77 | HR 96 | Temp 97.6°F | Resp 20 | Ht 66.0 in | Wt 160.0 lb

## 2018-08-19 DIAGNOSIS — C562 Malignant neoplasm of left ovary: Secondary | ICD-10-CM | POA: Insufficient documentation

## 2018-08-19 DIAGNOSIS — C561 Malignant neoplasm of right ovary: Secondary | ICD-10-CM | POA: Insufficient documentation

## 2018-08-19 DIAGNOSIS — Z09 Encounter for follow-up examination after completed treatment for conditions other than malignant neoplasm: Secondary | ICD-10-CM

## 2018-08-19 DIAGNOSIS — J90 Pleural effusion, not elsewhere classified: Secondary | ICD-10-CM

## 2018-08-19 DIAGNOSIS — R197 Diarrhea, unspecified: Secondary | ICD-10-CM | POA: Diagnosis not present

## 2018-08-19 DIAGNOSIS — Z79899 Other long term (current) drug therapy: Secondary | ICD-10-CM | POA: Insufficient documentation

## 2018-08-19 DIAGNOSIS — F418 Other specified anxiety disorders: Secondary | ICD-10-CM | POA: Diagnosis not present

## 2018-08-19 DIAGNOSIS — Z5111 Encounter for antineoplastic chemotherapy: Secondary | ICD-10-CM | POA: Insufficient documentation

## 2018-08-19 DIAGNOSIS — R0789 Other chest pain: Secondary | ICD-10-CM | POA: Diagnosis not present

## 2018-08-19 DIAGNOSIS — R110 Nausea: Secondary | ICD-10-CM | POA: Insufficient documentation

## 2018-08-19 DIAGNOSIS — Z803 Family history of malignant neoplasm of breast: Secondary | ICD-10-CM | POA: Insufficient documentation

## 2018-08-19 DIAGNOSIS — C563 Malignant neoplasm of bilateral ovaries: Secondary | ICD-10-CM

## 2018-08-19 DIAGNOSIS — Z87891 Personal history of nicotine dependence: Secondary | ICD-10-CM | POA: Insufficient documentation

## 2018-08-19 DIAGNOSIS — Z8049 Family history of malignant neoplasm of other genital organs: Secondary | ICD-10-CM | POA: Diagnosis not present

## 2018-08-19 NOTE — Progress Notes (Signed)
START ON PATHWAY REGIMEN - Ovarian     A cycle is every 21 days:     Paclitaxel      Carboplatin   **Always confirm dose/schedule in your pharmacy ordering system**  Patient Characteristics: Preoperative or Nonsurgical Candidate (Clinical Staging), Newly Diagnosed, Neoadjuvant Therapy followed by Surgery Therapeutic Status: Preoperative or Nonsurgical Candidate (Clinical Staging) BRCA Mutation Status: Awaiting Test Results AJCC T Category: cTX AJCC 8 Stage Grouping: IVA AJCC N Category: cNX AJCC M Category: pM1a Therapy Plan: Neoadjuvant Therapy followed by Surgery Intent of Therapy: Non-Curative / Palliative Intent, Discussed with Patient 

## 2018-08-19 NOTE — Patient Instructions (Addendum)
[place] at [facility] Discharge Instructions  You were seen today by Dr. Delton Coombes. He talked with you about starting treatment next week.  The plan is to have 3 cycles of treatment and then another scan.  If the response is good then you will go back to see Dr. Denman George for surgery and will come back for 3 more cycles of chemo.  The chemotherapy medications he talked to you about were Carboplatin and Taxol.  You will get your port placed tomorrow and then we will start chemo on Monday.     Thank you for choosing [place] at [facility] to provide your oncology and hematology care.  To afford each patient quality time with our provider, please arrive at least 15 minutes before your scheduled appointment time.   If you have a lab appointment with the Branch please come in thru the  Main Entrance and check in at the main information desk  You need to re-schedule your appointment should you arrive 10 or more minutes late.  We strive to give you quality time with our providers, and arriving late affects you and other patients whose appointments are after yours.  Also, if you no show three or more times for appointments you may be dismissed from the clinic at the providers discretion.     Again, thank you for choosing [facility].  Our hope is that these requests will decrease the amount of time that you wait before being seen by our physicians.       _____________________________________________________________  Should you have questions after your visit to [facility], please contact our office at [phone] between the hours of 8:00 a.m. and 4:30 p.m.  Voicemails left after 4:00 p.m. will not be returned until the following business day.  For prescription refill requests, have your pharmacy contact our office and allow 72 hours.    Cancer Center Support Programs:   > Cancer Support Group  2nd Tuesday of the month  1pm-2pm, Journey Room

## 2018-08-19 NOTE — Assessment & Plan Note (Signed)
1.  Clinical stage IVa ovarian cancer, with positive cytology of left pleural effusion: - Cytology of the left pleural fluid consistent with adenocarcinoma, likely gynecological primary. - PET scan on 07/31/2018 shows hypermetabolic tissue associated with cystic masses of the left and right ovary consistent with ovarian carcinoma.  Uptake in the left pleural space present. - She met with Dr. Denman George, who recommended neoadjuvant chemotherapy. - She is scheduled for CT abdomen and pelvis on 08/21/2018.  She is scheduled for port placement tomorrow. -We talked about neoadjuvant chemotherapy with carboplatin and paclitaxel every 3 weeks for 3 cycles followed by rescanning for possible debulking surgery.  She will receive another 3 cycles after debulking surgery. -We talked about various side effects including but not limited to allergic reactions, myelosuppression, peripheral neuropathy, life-threatening infections among others. - She will come back next Monday for initiation of chemotherapy.  2.  Left chest wall pain: - She has pain at the site of Pleurx catheter.  She is following up with Dr. Darcey Nora today for possible removal. -She is taking Dilaudid 4 mg every 4-5 hours during the daytime which is helping with the pain.  We have sent a refill.  3.  Splenic hematoma: -This was incidental finding on PET scan.  She underwent coil embolization of splenic artery on 08/01/2018.  Hemoglobin has been stable.

## 2018-08-19 NOTE — Progress Notes (Signed)
PCP is [name], MD Referring Provider is Derek Jack, MD  Chief Complaint  Patient presents with  . Pleural Effusion    f/u with CXR s/ p Lt VATS, for drainage of loculated pleural effusion, currently draining once a week, average amounts less than 20cc's    HPI: Patient returns for routine follow-up 1 month after left VATS for large left pleural effusion which was found to be secondary to ovarian cancer.  12 pleurodesis and a Pleurx catheter placed at the same time.  For the past 2 weeks there is been scant drainage from the catheter.  Her chest x-ray is stable with a mild loculated basilar pleural effusion.  She is not short of breath and her oxygen saturation room air is 94%.  The patient be scheduled for removal of the left Pleurx catheter as an outpatient at the [facility] short stay tomorrow.  I discussed the procedure of left Pleurx catheter removal including the use of local anesthesia, the incision, and the suture closure of the exit site.  She agrees to proceed.   Past Medical History:  Diagnosis Date  . Anxiety and depression   . Arthritis of facet joints at multiple vertebral levels    L5-S1  . Constipation   . Dyslipidemia   . Family history of breast cancer   . Family history of uterine cancer   . Insomnia   . Irritable bowel syndrome   . Migraine   . Muscle tension headache   . Ovarian carcinoma ([place])   . Plantar fasciitis of right foot     Past Surgical History:  Procedure Laterality Date  . CHOLECYSTECTOMY  2008  . COLONOSCOPY N/A 08/13/2013   Procedure: COLONOSCOPY;  Surgeon: Rogene Houston, MD;  [facility];  Service: Endoscopy;  Laterality: N/A;  230-moved to 145 Ann to notify pt  . ESOPHAGOGASTRODUODENOSCOPY    . ESOPHAGOGASTRODUODENOSCOPY (EGD) WITH PROPOFOL N/A 07/20/2018   Procedure: ESOPHAGOGASTRODUODENOSCOPY (EGD) WITH PROPOFOL;  Surgeon: Rogene Houston, MD;  [facility];  Service: Endoscopy;  Laterality: N/A;  Possible  esophageal dilation.  . IR ANGIOGRAM SELECTIVE EACH ADDITIONAL VESSEL  08/01/2018  . IR ANGIOGRAM VISCERAL SELECTIVE  08/01/2018  . IR EMBO ART  VEN HEMORR LYMPH EXTRAV  INC GUIDE ROADMAPPING  08/01/2018  . IR PERC PLEURAL DRAIN W/INDWELL CATH W/IMG GUIDE  07/08/2018  . IR THORACENTESIS ASP PLEURAL SPACE W/IMG GUIDE  07/07/2018  . IR US GUIDE VASC ACCESS RIGHT  08/01/2018  . PLEURAL EFFUSION DRAINAGE Left 07/13/2018   Procedure: DRAINAGE OF LOCULATED PLEURAL EFFUSION;  Surgeon: Ivin Poot, MD;  Location: [place];  Service: Thoracic;  Laterality: Left;  . TALC PLEURODESIS Left 07/13/2018   Procedure: Talc Pleuradesis;  Surgeon: Prescott Gum, Collier Salina, MD;  Location: [place];  Service: Thoracic;  Laterality: Left;  . TUBAL LIGATION Bilateral   . UTERINE ABLATION    . VIDEO ASSISTED THORACOSCOPY Left 07/13/2018   Procedure: VIDEO ASSISTED THORACOSCOPY;  Surgeon: Ivin Poot, MD;  Location: [facility] OR;  Service: Thoracic;  Laterality: Left;    Family History  Problem Relation Age of Onset  . Hypertension Mother   . Obesity Mother   . Diabetes Mother   . Kidney disease Mother   . Peripheral vascular disease Father   . Atrial fibrillation Father   . COPD Brother   . Osteoporosis Brother   . Crohn's disease Sister   . Uterine cancer Sister 44       maternal  half sister  . Breast cancer Maternal Aunt 60  . Colon cancer Neg Hx     Social History Social History   Tobacco Use  . Smoking status: Former Smoker    Packs/day: 0.50    Years: 17.00    Pack years: 8.50    Types: Cigarettes    Quit date: 06/22/2018    Years since quitting: 0.1  . Smokeless tobacco: Never Used  Substance Use Topics  . Alcohol use: Yes    Alcohol/week: 1.0 standard drinks    Types: 1 Glasses of wine per week    Comment: Drinks alcohol on occasion  . Drug use: No    Current Outpatient Medications  Medication Sig Dispense Refill  . diazepam (VALIUM) 5 MG tablet TAKE ONE TABLET BY MOUTH AT BEDTIME AS NEEDED FOR  ANXIETY 30 tablet 5  . docusate sodium (COLACE) 100 MG capsule Take 1 capsule (100 mg total) by mouth 2 (two) times daily. 60 capsule 2  . gabapentin (NEURONTIN) 300 MG capsule Take 1 capsule (300 mg total) by mouth 3 (three) times daily. 90 capsule 0  . HYDROmorphone (DILAUDID) 4 MG tablet Take 1 tablet (4 mg total) by mouth every 4 (four) hours as needed for severe pain. 84 tablet 0  . pantoprazole (PROTONIX) 40 MG tablet Take 1 tablet (40 mg total) by mouth 2 (two) times daily before a meal. 60 tablet 2  . polyethylene glycol (MIRALAX / GLYCOLAX) 17 g packet Take 17 g by mouth as needed.    . promethazine (PHENERGAN) 25 MG tablet Take 1 tablet (25 mg total) by mouth every 6 (six) hours as needed for nausea, vomiting or refractory nausea / vomiting. 30 tablet 0  . SUMAtriptan (IMITREX) 100 MG tablet TAKE ONE TABLET BY MOUTH PRN UP to TWICE DAILY AS NEEDED. 9 tablet 5  . benzonatate (TESSALON PERLES) 100 MG capsule Take 1 capsule (100 mg total) by mouth 3 (three) times daily as needed for cough. (Patient not taking: Reported on 08/19/2018) 30 capsule 0  . Oxycodone HCl 10 MG TABS Take 1 tablet (10 mg total) by mouth every 4 (four) hours as needed. (Patient not taking: Reported on 08/19/2018) 84 tablet 0   No current facility-administered medications for this visit.     Allergies  Allergen Reactions  . Morphine And Related Itching  . Nortriptyline Other (See Comments)    Significant weight gain  . Topamax [Topiramate] Diarrhea    nausea  . Actifed Cold-Allergy [Chlorpheniramine-Phenyleph Er] Rash  . Amoxicillin Rash    Did it involve swelling of the face/tongue/throat, SOB, or low BP? Unknown Did it involve sudden or severe rash/hives, skin peeling, or any reaction on the inside of your mouth or nose? Unknown Did you need to seek medical attention at a hospital or doctor's office? Unknown When did it last happen? teenager If all above answers are "NO", may proceed with cephalosporin  use.   . Codeine Hives  . Erythromycin Rash  . Penicillins Rash    Did it involve swelling of the face/tongue/throat, SOB, or low BP? Unknown Did it involve sudden or severe rash/hives, skin peeling, or any reaction on the inside of your mouth or nose? Unknown Did you need to seek medical attention at a hospital or doctor's office? Unknown When did it last happen? teenager If all above answers are "NO", may proceed with cephalosporin use.   [name] [Pseudoephedrine Hcl] Rash    Review of Systems  Patient is taking Neurontin for  her post left VATS neuropathic pain. The patient is taking Dilaudid for abdominal pain from her ovarian cancer Patient is not taking any blood thinners. BP 124/77   Pulse 96   Temp 97.6 F (36.4 C) (Skin)   Resp 20   Ht 5\' 6"  (1.676 m)   Wt 160 lb (72.6 kg)   SpO2 93% Comment: RA  BMI 25.82 kg/m  Physical Exam Patient comfortable without shortness of breath Slightly diminished breath sounds left base Left VATS incision well-healed Left Pleurx catheter dressing dry and intact No peripheral edema Heart rhythm regular without murmur  Diagnostic Tests: Chest x-ray today personally reviewed showing the catheter in good position but with a loculated small effusion and pleural thickening  Impression: Scant drainage from Pleurx catheter which is not providing any benefit to the patient.  We will plan on scheduling removal of the left catheter: Outpatient area treatment room tomorrow under local anesthesia.  Plan: Patient will be in the short stay area to get a Port-A-Cath placed by interventional radiology the left Pleurx catheter will be removed in the short stay treatment room after the Port-A-Cath procedure is completed.   Len Childs, MD [facility] [phone]

## 2018-08-19 NOTE — Progress Notes (Signed)
[name], [place] [address]   CLINIC:  Medical Oncology/Hematology  PCP:  [name], MD [place] [address] [phone]   REASON FOR VISIT:  Follow-up for ovarian cancer, splenic hematoma.   BRIEF ONCOLOGIC HISTORY:  Oncology History  Ovarian cancer, bilateral ([place])  07/07/2018 Pathology Results   PLEURAL FLUID, LEFT (SPECIMEN 1 OF 1, COLLECTED 07/07/18): - MALIGNANT CELLS CONSISTENT WITH ADENOCARCINOMA - SEE COMMENT  Source Pleural Fluid, (Specimen 1 of 1, collected on 07/07/2018) Gross Specimen: Received is/are 1000cc of bloody red fluid with tissue. (TC:tc) Prepared: # Smears: 0 # Concentration Technique Slides (i.e. ThinPrep): 1 # Cell Block: 1 Conventional Additional Studies: Two Hematology slides labeled [id number] Comment The malignant cells are positive for cytokeratin 7, p53, WT-1, Pax-8, Moc31, ER (weak) and EMA but negative for cytokeratin 20, TTF-1, GATA-3, CDX-2 and D2-40. Overall, the phenotype is consistent with a gynecologic primary; clinical correlation recommended.   07/07/2018 Procedure   Successful ultrasound guided left thoracentesis yielding 2.0 L of pleural fluid   07/08/2018 Procedure   1. Technically successful placement of left 14 French pigtail chest drain, placed to Pleur-evac water-seal.   07/08/2018 Procedure   1. Technically successful five [place] double lumen power injectable PICC placement   07/09/2018 Imaging   Ct chest 1. There is a moderate, loculated left hydropneumothorax with a small air component and moderate fluid component. The largest loculated component is located posteriorly. There is a pigtail drainage catheter about the lateral pleural space. There is no obvious etiology, such as obvious mass or pleural disease.   2. There is a small right pleural effusion with associated atelectasis or consolidation and a subpleural consolidation of the superior segment right lower lobe (series  4, image 56), of uncertain significance, possibly infectious or inflammatory   07/10/2018 Imaging   Ct abdomen and pelvis: 1. The bilateral ovaries are enlarged by heterogeneous appearing cystic lesions, measuring 5.3 x 4.2 cm on the right (series 4, image 72) and 4.5 x 3.2 cm on the left (series 4, image 75). Consider dedicated pelvic ultrasound and/or pelvic MRI to further evaluate for solid components given high suspicion for GYN primary malignancy.   2. No other evidence of mass and no lymphadenopathy in the abdomen or pelvis.   3. Trace ascites. There is some suggestion of omental and peritoneal nodularity (e.g. Series 4, image 40), concerning for peritoneal metastatic disease.    4. Loculated left-sided pleural effusion with left-sided pleural drainage catheter in position. Small right pleural effusion   07/13/2018 Surgery   OPERATION: 1.  Left VATS (video-assisted thoracoscopic surgery) for drainage of loculated pleural effusion. 2.  Talc pleurodesis for malignant pleural effusion. 3.  Placement of PleurX catheter for management of malignant pleural effusion. 4.  Placement of On-Q analgesia catheter system.    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   07/13/2018 Pathology Results   Pleura, peel, Left Pleural - FIBRO-FIBRINOUS PLEURITIS - NEGATIVE FOR MALIGNANCY   07/18/2018 Initial Diagnosis   Ovarian cancer, bilateral ([place])   07/20/2018 Procedure   EGD impression: Normal proximal esophagus and mid esophagus. Mild distal esophageal rings; dilation not performed because of esophagitis. LA Grade C reflux esophagitis. Z-line regular, 30 cm from the incisors. 5 cm hiatal hernia. Non-bleeding gastric ulcer with no stigmata of bleeding. Gastritis.  Duodenal erosions without bleeding. Normal  second portion of the duodenum. No specimens collected.   08/24/2018 -  Chemotherapy   The patient had palonosetron (ALOXI) injection 0.25 mg, 0.25 mg, Intravenous,  Once, 0 of 6 cycles CARBOplatin (PARAPLATIN) in sodium chloride 0.9 % 100 mL chemo infusion, , Intravenous,  Once, 0 of 6 cycles PACLitaxel (TAXOL) 324 mg in sodium chloride 0.9 % 500 mL chemo infusion (> '80mg'$ /m2), 175 mg/m2, Intravenous,  Once, 0 of 6 cycles fosaprepitant (EMEND) 150 mg, dexamethasone (DECADRON) 12 mg in sodium chloride 0.9 % 145 mL IVPB, , Intravenous,  Once, 0 of 6 cycles  for chemotherapy treatment.       CANCER STAGING: Cancer Staging No matching staging information was found for the patient.   INTERVAL HISTORY:  Ms. Veazie 48 y.o. female seen for follow-up of her advanced ovarian cancer.  She reports that her pain is better controlled with Dilaudid 4 mg every 4-5 hours during the daytime.  She had to wake up one time in the last week to take the pain medication.  Occasional nausea associated with it.  Appetite has been good.  Energy levels are 50%.  Pain is rated as 5 out of 10.  She has an appointment to see Dr. Darcey Nora later today.  Denies any fevers or chills.  No vomiting, diarrhea or constipation reported.  Denies any headaches or vision changes.   REVIEW OF SYSTEMS:  Review of Systems  Constitutional: Negative for fatigue.  Respiratory: Negative for shortness of breath.   Cardiovascular: Positive for chest pain.  Gastrointestinal: Positive for nausea.  All other systems reviewed and are negative.    PAST MEDICAL/SURGICAL HISTORY:  Past Medical History:  Diagnosis Date  . Anxiety and depression   . Arthritis of facet joints at multiple vertebral levels    L5-S1  . Constipation   . Dyslipidemia   . Family history of breast cancer   . Family history of uterine cancer   . Insomnia   . Irritable bowel syndrome   . Migraine   . Muscle tension headache   . Ovarian carcinoma ([place])   .  Plantar fasciitis of right foot    Past Surgical History:  Procedure Laterality Date  . CHOLECYSTECTOMY  2008  . COLONOSCOPY N/A 08/13/2013   Procedure: COLONOSCOPY;  Surgeon: Rogene Houston, MD;  [facility];  Service: Endoscopy;  Laterality: N/A;  230-moved to 145 Ann to notify pt  . ESOPHAGOGASTRODUODENOSCOPY    . ESOPHAGOGASTRODUODENOSCOPY (EGD) WITH PROPOFOL N/A 07/20/2018   Procedure: ESOPHAGOGASTRODUODENOSCOPY (EGD) WITH PROPOFOL;  Surgeon: Rogene Houston, MD;  [facility];  Service: Endoscopy;  Laterality: N/A;  Possible esophageal dilation.  . IR ANGIOGRAM SELECTIVE EACH ADDITIONAL VESSEL  08/01/2018  . IR ANGIOGRAM VISCERAL SELECTIVE  08/01/2018  . IR EMBO ART  VEN HEMORR LYMPH EXTRAV  INC GUIDE ROADMAPPING  08/01/2018  . IR PERC PLEURAL DRAIN W/INDWELL CATH W/IMG GUIDE  07/08/2018  . IR THORACENTESIS ASP PLEURAL SPACE W/IMG GUIDE  07/07/2018  . IR US GUIDE VASC ACCESS RIGHT  08/01/2018  . PLEURAL EFFUSION DRAINAGE Left 07/13/2018   Procedure: DRAINAGE OF LOCULATED PLEURAL EFFUSION;  Surgeon: Ivin Poot, MD;  Location: [place];  Service: Thoracic;  Laterality: Left;  . TALC PLEURODESIS Left 07/13/2018   Procedure: Talc Pleuradesis;  Surgeon: Prescott Gum, Collier Salina, MD;  Location: [place];  Service: Thoracic;  Laterality: Left;  . TUBAL LIGATION Bilateral   . UTERINE ABLATION    .  VIDEO ASSISTED THORACOSCOPY Left 07/13/2018   Procedure: VIDEO ASSISTED THORACOSCOPY;  Surgeon: Ivin Poot, MD;  Location: [facility] OR;  Service: Thoracic;  Laterality: Left;     SOCIAL HISTORY:  Social History   Socioeconomic History  . Marital status: Widowed    Spouse name: Not on file  . Number of children: 2  . Years of education: 2-College  . Highest education level: Not on file  Occupational History    Employer: [employer]  . Financial resource strain: Not hard at all  . Food insecurity    Worry: Never true    Inability: Never true  . Transportation needs     Medical: No    Non-medical: No  Tobacco Use  . Smoking status: Former Smoker    Packs/day: 0.50    Years: 17.00    Pack years: 8.50    Types: Cigarettes    Quit date: 06/22/2018    Years since quitting: 0.1  . Smokeless tobacco: Never Used  Substance and Sexual Activity  . Alcohol use: Yes    Alcohol/week: 1.0 standard drinks    Types: 1 Glasses of wine per week    Comment: Drinks alcohol on occasion  . Drug use: No  . Sexual activity: Not on file  Lifestyle  . Physical activity    Days per week: 0 days    Minutes per session: 0 min  . Stress: Very much  Relationships  . Social connections    Talks on phone: More than three times a week    Gets together: More than three times a week    Attends religious service: 1 to 4 times per year    Active member of club or organization: No    Attends meetings of clubs or organizations: Never    Relationship status: Widowed  . Intimate partner violence    Fear of current or ex partner: No    Emotionally abused: No    Physically abused: No    Forced sexual activity: No  Other Topics Concern  . Not on file  Social History Narrative   Patient lives at home with her daughter.    Patient has 2 children.    Patient is widowed.    Patient is right handed.    Patient has her Associates degree.       FAMILY HISTORY:  Family History  Problem Relation Age of Onset  . Hypertension Mother   . Obesity Mother   . Diabetes Mother   . Kidney disease Mother   . Peripheral vascular disease Father   . Atrial fibrillation Father   . COPD Brother   . Osteoporosis Brother   . Crohn's disease Sister   . Uterine cancer Sister 82       maternal half sister  . Breast cancer Maternal Aunt 46  . Colon cancer Neg Hx     CURRENT MEDICATIONS:  Outpatient Encounter Medications as of 08/19/2018  Medication Sig  . benzonatate (TESSALON PERLES) 100 MG capsule Take 1 capsule (100 mg total) by mouth 3 (three) times daily as needed for cough.  .  diazepam (VALIUM) 5 MG tablet TAKE ONE TABLET BY MOUTH AT BEDTIME AS NEEDED FOR ANXIETY  . docusate sodium (COLACE) 100 MG capsule Take 1 capsule (100 mg total) by mouth 2 (two) times daily.  Marland Kitchen gabapentin (NEURONTIN) 300 MG capsule Take 1 capsule (300 mg total) by mouth 3 (three) times daily.  Marland Kitchen HYDROmorphone (DILAUDID) 4 MG tablet Take 1  tablet (4 mg total) by mouth every 4 (four) hours as needed for severe pain.  . pantoprazole (PROTONIX) 40 MG tablet Take 1 tablet (40 mg total) by mouth 2 (two) times daily before a meal.  . polyethylene glycol (MIRALAX / GLYCOLAX) 17 g packet Take 17 g by mouth as needed.  . promethazine (PHENERGAN) 25 MG tablet Take 1 tablet (25 mg total) by mouth every 6 (six) hours as needed for nausea, vomiting or refractory nausea / vomiting.  . SUMAtriptan (IMITREX) 100 MG tablet TAKE ONE TABLET BY MOUTH PRN UP to TWICE DAILY AS NEEDED.  Marland Kitchen Oxycodone HCl 10 MG TABS Take 1 tablet (10 mg total) by mouth every 4 (four) hours as needed. (Patient not taking: Reported on 08/19/2018)   No facility-administered encounter medications on file as of 08/19/2018.     ALLERGIES:  Allergies  Allergen Reactions  . Morphine And Related Itching  . Nortriptyline Other (See Comments)    Significant weight gain  . Topamax [Topiramate] Diarrhea    nausea  . Actifed Cold-Allergy [Chlorpheniramine-Phenyleph Er] Rash  . Amoxicillin Rash    Did it involve swelling of the face/tongue/throat, SOB, or low BP? Unknown Did it involve sudden or severe rash/hives, skin peeling, or any reaction on the inside of your mouth or nose? Unknown Did you need to seek medical attention at a hospital or doctor's office? Unknown When did it last happen? teenager If all above answers are "NO", may proceed with cephalosporin use.   . Codeine Hives  . Erythromycin Rash  . Penicillins Rash    Did it involve swelling of the face/tongue/throat, SOB, or low BP? Unknown Did it involve sudden or severe  rash/hives, skin peeling, or any reaction on the inside of your mouth or nose? Unknown Did you need to seek medical attention at a hospital or doctor's office? Unknown When did it last happen? teenager If all above answers are "NO", may proceed with cephalosporin use.   [name] [Pseudoephedrine Hcl] Rash     PHYSICAL EXAM:  ECOG Performance status: 1  Vitals:   08/19/18 1059  BP: 113/78  Pulse: (!) 103  Resp: 14  Temp: (!) 97.1 F (36.2 C)  SpO2: 98%   Filed Weights   08/19/18 1059  Weight: 160 lb 6.4 oz (72.8 kg)    Physical Exam Vitals signs reviewed.  Constitutional:      Appearance: Normal appearance.  Cardiovascular:     Rate and Rhythm: Normal rate and regular rhythm.     Heart sounds: Normal heart sounds.  Pulmonary:     Effort: Pulmonary effort is normal.     Breath sounds: Normal breath sounds.     Comments: Decreased breath sounds at left lung base.  Left Pleurx catheter present. Abdominal:     General: There is no distension.     Palpations: Abdomen is soft. There is no mass.  Skin:    General: Skin is warm.  Neurological:     General: No focal deficit present.     Mental Status: She is alert and oriented to person, place, and time.  Psychiatric:        Mood and Affect: Mood normal.        Behavior: Behavior normal.      LABORATORY DATA:  I have reviewed the labs as listed.  CBC    Component Value Date/Time   WBC 9.3 08/14/2018 1429   RBC 4.72 08/14/2018 1429   HGB 13.7 08/14/2018 1429   HCT  44.8 08/14/2018 1429   PLT 581 (H) 08/14/2018 1429   MCV 94.9 08/14/2018 1429   MCH 29.0 08/14/2018 1429   MCHC 30.6 08/14/2018 1429   RDW 14.5 08/14/2018 1429   LYMPHSABS 2.3 08/14/2018 1429   MONOABS 0.9 08/14/2018 1429   EOSABS 0.4 08/14/2018 1429   BASOSABS 0.1 08/14/2018 1429   CMP Latest Ref Rng & Units 08/14/2018 08/02/2018 07/31/2018  Glucose 70 - 99 mg/dL 107(H) 97 95  BUN 6 - 20 mg/dL 8 <5(L) 9  Creatinine 0.44 - 1.00 mg/dL 0.70  0.75 0.63  Sodium 135 - 145 mmol/L 139 134(L) 136  Potassium 3.5 - 5.1 mmol/L 4.2 4.8 3.8  Chloride 98 - 111 mmol/L 102 100 101  CO2 22 - 32 mmol/L '28 22 25  '$ Calcium 8.9 - 10.3 mg/dL 9.4 9.1 9.0  Total Protein 6.5 - 8.1 g/dL 7.0 - 7.1  Total Bilirubin 0.3 - 1.2 mg/dL 0.6 - 0.9  Alkaline Phos 38 - 126 U/L 214(H) - 138(H)  AST 15 - 41 U/L 36 - 25  ALT 0 - 44 U/L 27 - 27       DIAGNOSTIC IMAGING:  I have independently reviewed the scans and discussed with the patient.   I have reviewed Venita Lick LPN's note and agree with the documentation.  I personally performed a face-to-face visit, made revisions and my assessment and plan is as follows.    ASSESSMENT & PLAN:   Ovarian cancer, bilateral ([place]) 1.  Clinical stage IVa ovarian cancer, with positive cytology of left pleural effusion: - Cytology of the left pleural fluid consistent with adenocarcinoma, likely gynecological primary. - PET scan on 07/31/2018 shows hypermetabolic tissue associated with cystic masses of the left and right ovary consistent with ovarian carcinoma.  Uptake in the left pleural space present. - She met with Dr. Denman George, who recommended neoadjuvant chemotherapy. - She is scheduled for CT abdomen and pelvis on 08/21/2018.  She is scheduled for port placement tomorrow. -We talked about neoadjuvant chemotherapy with carboplatin and paclitaxel every 3 weeks for 3 cycles followed by rescanning for possible debulking surgery.  She will receive another 3 cycles after debulking surgery. -We talked about various side effects including but not limited to allergic reactions, myelosuppression, peripheral neuropathy, life-threatening infections among others. - She will come back next Monday for initiation of chemotherapy.  2.  Left chest wall pain: - She has pain at the site of Pleurx catheter.  She is following up with Dr. Darcey Nora today for possible removal. -She is taking Dilaudid 4 mg every 4-5 hours during the daytime  which is helping with the pain.  We have sent a refill.  3.  Splenic hematoma: -This was incidental finding on PET scan.  She underwent coil embolization of splenic artery on 08/01/2018.  Hemoglobin has been stable.   Total time spent is 40 minutes with more than 50% of the time spent face-to-face discussing treatment plan, counseling and coordination of care.  Orders placed this encounter:  No orders of the defined types were placed in this encounter.     Derek Jack, MD [place] [phone]  \

## 2018-08-20 ENCOUNTER — Encounter (HOSPITAL_COMMUNITY)
Admission: RE | Disposition: A | Discharge status: Home / Self Care | Payer: Self-pay | Source: Home / Self Care | Attending: Cardiothoracic Surgery

## 2018-08-20 ENCOUNTER — Encounter: Payer: Self-pay | Admitting: Genetic Counselor

## 2018-08-20 ENCOUNTER — Ambulatory Visit (HOSPITAL_COMMUNITY)
Admission: RE | Admit: 2018-08-20 | Discharge: 2018-08-20 | Disposition: A | Discharge status: Home / Self Care | Payer: Medicaid Other | Source: Ambulatory Visit | Attending: Hematology | Admitting: Hematology

## 2018-08-20 ENCOUNTER — Ambulatory Visit (HOSPITAL_COMMUNITY)
Admission: RE | Admit: 2018-08-20 | Discharge: 2018-08-20 | Disposition: A | Discharge status: Home / Self Care | Payer: Medicaid Other | Attending: Cardiothoracic Surgery | Admitting: Cardiothoracic Surgery

## 2018-08-20 ENCOUNTER — Ambulatory Visit (HOSPITAL_COMMUNITY): Payer: Medicaid Other | Admitting: Certified Registered Nurse Anesthetist

## 2018-08-20 ENCOUNTER — Ambulatory Visit (HOSPITAL_COMMUNITY): Payer: Self-pay | Admitting: Hematology

## 2018-08-20 ENCOUNTER — Encounter (HOSPITAL_COMMUNITY): Payer: Self-pay | Admitting: *Deleted

## 2018-08-20 ENCOUNTER — Telehealth: Payer: Self-pay | Admitting: Genetic Counselor

## 2018-08-20 ENCOUNTER — Encounter (HOSPITAL_COMMUNITY): Payer: Self-pay

## 2018-08-20 DIAGNOSIS — M47817 Spondylosis without myelopathy or radiculopathy, lumbosacral region: Secondary | ICD-10-CM | POA: Diagnosis not present

## 2018-08-20 DIAGNOSIS — Z808 Family history of malignant neoplasm of other organs or systems: Secondary | ICD-10-CM | POA: Insufficient documentation

## 2018-08-20 DIAGNOSIS — Z95828 Presence of other vascular implants and grafts: Secondary | ICD-10-CM

## 2018-08-20 DIAGNOSIS — Z803 Family history of malignant neoplasm of breast: Secondary | ICD-10-CM | POA: Insufficient documentation

## 2018-08-20 DIAGNOSIS — Z87891 Personal history of nicotine dependence: Secondary | ICD-10-CM | POA: Insufficient documentation

## 2018-08-20 DIAGNOSIS — J918 Pleural effusion in other conditions classified elsewhere: Secondary | ICD-10-CM | POA: Diagnosis not present

## 2018-08-20 DIAGNOSIS — J90 Pleural effusion, not elsewhere classified: Secondary | ICD-10-CM

## 2018-08-20 DIAGNOSIS — F419 Anxiety disorder, unspecified: Secondary | ICD-10-CM | POA: Insufficient documentation

## 2018-08-20 DIAGNOSIS — J910 Malignant pleural effusion: Secondary | ICD-10-CM

## 2018-08-20 DIAGNOSIS — Z4589 Encounter for adjustment and management of other implanted devices: Secondary | ICD-10-CM | POA: Insufficient documentation

## 2018-08-20 DIAGNOSIS — G43909 Migraine, unspecified, not intractable, without status migrainosus: Secondary | ICD-10-CM | POA: Insufficient documentation

## 2018-08-20 DIAGNOSIS — E785 Hyperlipidemia, unspecified: Secondary | ICD-10-CM | POA: Insufficient documentation

## 2018-08-20 DIAGNOSIS — F329 Major depressive disorder, single episode, unspecified: Secondary | ICD-10-CM | POA: Insufficient documentation

## 2018-08-20 DIAGNOSIS — C563 Malignant neoplasm of bilateral ovaries: Secondary | ICD-10-CM

## 2018-08-20 DIAGNOSIS — Z79899 Other long term (current) drug therapy: Secondary | ICD-10-CM | POA: Insufficient documentation

## 2018-08-20 DIAGNOSIS — K589 Irritable bowel syndrome without diarrhea: Secondary | ICD-10-CM | POA: Diagnosis not present

## 2018-08-20 DIAGNOSIS — C569 Malignant neoplasm of unspecified ovary: Secondary | ICD-10-CM | POA: Insufficient documentation

## 2018-08-20 DIAGNOSIS — Z885 Allergy status to narcotic agent status: Secondary | ICD-10-CM | POA: Insufficient documentation

## 2018-08-20 DIAGNOSIS — C562 Malignant neoplasm of left ovary: Secondary | ICD-10-CM | POA: Insufficient documentation

## 2018-08-20 DIAGNOSIS — C561 Malignant neoplasm of right ovary: Secondary | ICD-10-CM | POA: Insufficient documentation

## 2018-08-20 DIAGNOSIS — Z881 Allergy status to other antibiotic agents status: Secondary | ICD-10-CM | POA: Insufficient documentation

## 2018-08-20 DIAGNOSIS — Z880 Allergy status to penicillin: Secondary | ICD-10-CM | POA: Insufficient documentation

## 2018-08-20 DIAGNOSIS — Z1379 Encounter for other screening for genetic and chromosomal anomalies: Secondary | ICD-10-CM | POA: Insufficient documentation

## 2018-08-20 HISTORY — PX: REMOVAL OF PLEURAL DRAINAGE CATHETER: SHX5080

## 2018-08-20 HISTORY — DX: Presence of other vascular implants and grafts: Z95.828

## 2018-08-20 HISTORY — PX: IR IMAGING GUIDED PORT INSERTION: IMG5740

## 2018-08-20 LAB — BASIC METABOLIC PANEL
Anion gap: 11 (ref 5–15)
BUN: 7 mg/dL (ref 6–20)
CO2: 25 mmol/L (ref 22–32)
Calcium: 9.4 mg/dL (ref 8.9–10.3)
Chloride: 101 mmol/L (ref 98–111)
Creatinine, Ser: 0.66 mg/dL (ref 0.44–1.00)
GFR calc Af Amer: >60 mL/min (ref >60)
GFR calc non Af Amer: >60 mL/min (ref >60)
Glucose, Bld: 94 mg/dL (ref 70–99)
Potassium: 4.1 mmol/L (ref 3.5–5.1)
Sodium: 137 mmol/L (ref 135–145)

## 2018-08-20 LAB — CBC
HCT: 41.9 % (ref 36.0–46.0)
Hemoglobin: 13.1 g/dL (ref 12.0–15.0)
MCH: 30 pg (ref 26.0–34.0)
MCHC: 31.3 g/dL (ref 30.0–36.0)
MCV: 95.9 fL (ref 80.0–100.0)
Platelets: 585 10*3/uL — ABNORMAL HIGH (ref 150–400)
RBC: 4.37 MIL/uL (ref 3.87–5.11)
RDW: 14.9 % (ref 11.5–15.5)
WBC: 9.7 10*3/uL (ref 4.0–10.5)
nRBC: 0 % (ref 0.0–0.2)

## 2018-08-20 LAB — PROTIME-INR
INR: 1.1 (ref 0.8–1.2)
Prothrombin Time: 13.6 seconds (ref 11.4–15.2)

## 2018-08-20 LAB — ACID FAST CULTURE WITH REFLEXED SENSITIVITIES (MYCOBACTERIA): Acid Fast Culture: NEGATIVE

## 2018-08-20 LAB — POCT PREGNANCY, URINE: Preg Test, Ur: NEGATIVE

## 2018-08-20 SURGERY — REMOVAL, CLOSED DRAINAGE CATHETER SYSTEM, PLEURAL
Anesthesia: Monitor Anesthesia Care | Laterality: Left

## 2018-08-20 SURGERY — REMOVAL, CLOSED DRAINAGE CATHETER SYSTEM, PLEURAL
Anesthesia: General | Site: Chest | Laterality: Left

## 2018-08-20 MED ORDER — LIDOCAINE HCL (CARDIAC) PF 100 MG/5ML IV SOSY
PREFILLED_SYRINGE | INTRAVENOUS | Status: DC | PRN
  Administered 2018-08-20: 60 mg via INTRATRACHEAL

## 2018-08-20 MED ORDER — OXYCODONE HCL 5 MG/5ML PO SOLN: 5.0000 mg | Freq: Once | ORAL | Status: DC | PRN

## 2018-08-20 MED ORDER — SODIUM CHLORIDE 0.9 % IV SOLN: 250.0000 mL | INTRAVENOUS | Status: DC | PRN

## 2018-08-20 MED ORDER — VANCOMYCIN HCL IN DEXTROSE 1-5 GM/200ML-% IV SOLN
INTRAVENOUS | Status: AC
  Filled 2018-08-20: qty 200

## 2018-08-20 MED ORDER — DEXAMETHASONE SODIUM PHOSPHATE 10 MG/ML IJ SOLN
INTRAMUSCULAR | Status: DC | PRN
  Administered 2018-08-20: 10 mg via INTRAVENOUS

## 2018-08-20 MED ORDER — FENTANYL CITRATE (PF) 100 MCG/2ML IJ SOLN
INTRAMUSCULAR | Status: AC
  Filled 2018-08-20: qty 2

## 2018-08-20 MED ORDER — MIDAZOLAM HCL 2 MG/2ML IJ SOLN
INTRAMUSCULAR | Status: AC
  Filled 2018-08-20: qty 2

## 2018-08-20 MED ORDER — PROPOFOL 10 MG/ML IV BOLUS
INTRAVENOUS | Status: DC | PRN
  Administered 2018-08-20: 200 mg via INTRAVENOUS

## 2018-08-20 MED ORDER — LACTATED RINGERS IV SOLN
INTRAVENOUS | Status: DC | PRN
  Administered 2018-08-20: 15:00:00 via INTRAVENOUS

## 2018-08-20 MED ORDER — VANCOMYCIN HCL IN DEXTROSE 1-5 GM/200ML-% IV SOLN
INTRAVENOUS | Status: AC
  Administered 2018-08-20: 1000 mg via INTRAVENOUS
  Filled 2018-08-20: qty 200

## 2018-08-20 MED ORDER — ONDANSETRON HCL 4 MG/2ML IJ SOLN
INTRAMUSCULAR | Status: DC | PRN
  Administered 2018-08-20: 4 mg via INTRAVENOUS

## 2018-08-20 MED ORDER — PHENYLEPHRINE 40 MCG/ML (10ML) SYRINGE FOR IV PUSH (FOR BLOOD PRESSURE SUPPORT)
PREFILLED_SYRINGE | INTRAVENOUS | Status: AC
  Filled 2018-08-20: qty 10

## 2018-08-20 MED ORDER — LIDOCAINE HCL 1 % IJ SOLN
INTRAMUSCULAR | Status: AC
  Filled 2018-08-20: qty 20

## 2018-08-20 MED ORDER — ACETAMINOPHEN 500 MG PO TABS: 1000.0000 mg | ORAL_TABLET | Freq: Once | ORAL | Status: DC | PRN

## 2018-08-20 MED ORDER — FENTANYL CITRATE (PF) 250 MCG/5ML IJ SOLN
INTRAMUSCULAR | Status: AC
  Filled 2018-08-20: qty 5

## 2018-08-20 MED ORDER — OXYCODONE HCL 5 MG PO TABS: 5.0000 mg | ORAL_TABLET | Freq: Once | ORAL | Status: DC | PRN

## 2018-08-20 MED ORDER — FENTANYL CITRATE (PF) 100 MCG/2ML IJ SOLN
INTRAMUSCULAR | Status: DC | PRN
  Administered 2018-08-20: 50 ug via INTRAVENOUS

## 2018-08-20 MED ORDER — KETOROLAC TROMETHAMINE 15 MG/ML IJ SOLN
15.0000 mg | Freq: Four times a day (QID) | INTRAMUSCULAR | Status: DC
  Administered 2018-08-20: 16:00:00 15 mg via INTRAVENOUS

## 2018-08-20 MED ORDER — LIDOCAINE HCL (PF) 1 % IJ SOLN
INTRAMUSCULAR | Status: AC
  Filled 2018-08-20: qty 30

## 2018-08-20 MED ORDER — ACETAMINOPHEN 325 MG PO TABS: 650.0000 mg | ORAL_TABLET | ORAL | Status: DC | PRN

## 2018-08-20 MED ORDER — SODIUM CHLORIDE 0.9 % IV SOLN: INTRAVENOUS | Status: DC

## 2018-08-20 MED ORDER — HEPARIN SOD (PORK) LOCK FLUSH 100 UNIT/ML IV SOLN
INTRAVENOUS | Status: DC | PRN
  Administered 2018-08-20: 500 [IU] via INTRAVENOUS

## 2018-08-20 MED ORDER — VANCOMYCIN HCL IN DEXTROSE 1-5 GM/200ML-% IV SOLN
1000.0000 mg | INTRAVENOUS | Status: AC
  Administered 2018-08-20: 1000 mg via INTRAVENOUS

## 2018-08-20 MED ORDER — 0.9 % SODIUM CHLORIDE (POUR BTL) OPTIME
TOPICAL | Status: DC | PRN
  Administered 2018-08-20: 1000 mL

## 2018-08-20 MED ORDER — MIDAZOLAM HCL 2 MG/2ML IJ SOLN
INTRAMUSCULAR | Status: AC | PRN
  Administered 2018-08-20 (×2): 1 mg via INTRAVENOUS

## 2018-08-20 MED ORDER — ACETAMINOPHEN 10 MG/ML IV SOLN
1000.0000 mg | Freq: Once | INTRAVENOUS | Status: DC | PRN
  Administered 2018-08-20: 1000 mg via INTRAVENOUS

## 2018-08-20 MED ORDER — FENTANYL CITRATE (PF) 100 MCG/2ML IJ SOLN
INTRAMUSCULAR | Status: AC | PRN
  Administered 2018-08-20 (×2): 50 ug via INTRAVENOUS

## 2018-08-20 MED ORDER — LIDOCAINE-PRILOCAINE 2.5-2.5 % EX CREA: TOPICAL_CREAM | CUTANEOUS | 3 refills | Status: DC

## 2018-08-20 MED ORDER — MIDAZOLAM HCL 5 MG/5ML IJ SOLN
INTRAMUSCULAR | Status: DC | PRN
  Administered 2018-08-20: 2 mg via INTRAVENOUS

## 2018-08-20 MED ORDER — ACETAMINOPHEN 650 MG RE SUPP: 650.0000 mg | RECTAL | Status: DC | PRN

## 2018-08-20 MED ORDER — ACETAMINOPHEN 160 MG/5ML PO SOLN: 1000.0000 mg | Freq: Once | ORAL | Status: DC | PRN

## 2018-08-20 MED ORDER — DEXAMETHASONE SODIUM PHOSPHATE 10 MG/ML IJ SOLN
INTRAMUSCULAR | Status: AC
  Filled 2018-08-20: qty 1

## 2018-08-20 MED ORDER — LIDOCAINE HCL 1 % IJ SOLN
INTRAMUSCULAR | Status: DC | PRN
  Administered 2018-08-20: 30 mL

## 2018-08-20 MED ORDER — ACETAMINOPHEN 10 MG/ML IV SOLN
INTRAVENOUS | Status: AC
  Filled 2018-08-20: qty 100

## 2018-08-20 MED ORDER — PROCHLORPERAZINE MALEATE 10 MG PO TABS: 10.0000 mg | ORAL_TABLET | Freq: Four times a day (QID) | ORAL | 1 refills | Status: DC | PRN

## 2018-08-20 MED ORDER — KETOROLAC TROMETHAMINE 15 MG/ML IJ SOLN
INTRAMUSCULAR | Status: AC
  Filled 2018-08-20: qty 1

## 2018-08-20 MED ORDER — HEPARIN SOD (PORK) LOCK FLUSH 100 UNIT/ML IV SOLN
INTRAVENOUS | Status: AC
  Filled 2018-08-20: qty 5

## 2018-08-20 MED ORDER — SODIUM CHLORIDE 0.9% FLUSH: 3.0000 mL | INTRAVENOUS | Status: DC | PRN

## 2018-08-20 MED ORDER — ONDANSETRON HCL 4 MG/2ML IJ SOLN
INTRAMUSCULAR | Status: AC
  Filled 2018-08-20: qty 2

## 2018-08-20 MED ORDER — LIDOCAINE HCL (PF) 1 % IJ SOLN
INTRAMUSCULAR | Status: DC | PRN
  Administered 2018-08-20: 10 mL

## 2018-08-20 MED ORDER — SODIUM CHLORIDE 0.9% FLUSH: 3.0000 mL | Freq: Two times a day (BID) | INTRAVENOUS | Status: DC

## 2018-08-20 MED ORDER — FENTANYL CITRATE (PF) 100 MCG/2ML IJ SOLN
25.0000 ug | INTRAMUSCULAR | Status: DC | PRN
  Administered 2018-08-20 (×2): 50 ug via INTRAVENOUS

## 2018-08-20 MED ORDER — FENTANYL CITRATE (PF) 100 MCG/2ML IJ SOLN: 25.0000 ug | INTRAMUSCULAR | Status: DC | PRN

## 2018-08-20 SURGICAL SUPPLY — 35 items
ADH SKN CLS APL DERMABOND .7 (GAUZE/BANDAGES/DRESSINGS) ×1
CANISTER SUCT 3000ML PPV (MISCELLANEOUS) ×3 IMPLANT
COVER SURGICAL LIGHT HANDLE (MISCELLANEOUS) ×3 IMPLANT
COVER WAND RF STERILE (DRAPES) ×3 IMPLANT
DERMABOND ADVANCED (GAUZE/BANDAGES/DRESSINGS) ×2
DERMABOND ADVANCED .7 DNX12 (GAUZE/BANDAGES/DRESSINGS) ×1 IMPLANT
DRAPE C-ARM 42X72 X-RAY (DRAPES) ×1 IMPLANT
DRAPE LAPAROSCOPIC ABDOMINAL (DRAPES) ×3 IMPLANT
DRSG EMULSION OIL 3X3 NADH (GAUZE/BANDAGES/DRESSINGS) ×2 IMPLANT
DRSG TEGADERM 2-3/8X2-3/4 SM (GAUZE/BANDAGES/DRESSINGS) ×2 IMPLANT
ELECT REM PT RETURN 9FT ADLT (ELECTROSURGICAL) ×3
ELECTRODE REM PT RTRN 9FT ADLT (ELECTROSURGICAL) ×1 IMPLANT
GAUZE SPONGE 2X2 8PLY STRL LF (GAUZE/BANDAGES/DRESSINGS) IMPLANT
GLOVE BIO SURGEON STRL SZ7.5 (GLOVE) ×4 IMPLANT
GOWN STRL REUS W/ TWL LRG LVL3 (GOWN DISPOSABLE) ×2 IMPLANT
GOWN STRL REUS W/TWL LRG LVL3 (GOWN DISPOSABLE) ×6
KIT BASIN OR (CUSTOM PROCEDURE TRAY) ×3 IMPLANT
KIT PLEURX DRAIN CATH 1000ML (MISCELLANEOUS) ×1 IMPLANT
KIT PLEURX DRAIN CATH 15.5FR (DRAIN) ×1 IMPLANT
KIT TURNOVER KIT B (KITS) ×3 IMPLANT
NDL HYPO 25GX1X1/2 BEV (NEEDLE) ×1 IMPLANT
NEEDLE HYPO 25GX1X1/2 BEV (NEEDLE) ×3 IMPLANT
NS IRRIG 1000ML POUR BTL (IV SOLUTION) ×3 IMPLANT
PACK GENERAL/GYN (CUSTOM PROCEDURE TRAY) ×3 IMPLANT
PAD ARMBOARD 7.5X6 YLW CONV (MISCELLANEOUS) ×2 IMPLANT
SET DRAINAGE LINE (MISCELLANEOUS) IMPLANT
SPONGE GAUZE 2X2 STER 10/PKG (GAUZE/BANDAGES/DRESSINGS) ×2
SUT ETHILON 3 0 PS 1 (SUTURE) ×3 IMPLANT
SUT SILK 2 0 SH (SUTURE) ×1 IMPLANT
SUT VIC AB 3-0 SH 8-18 (SUTURE) ×3 IMPLANT
SYR CONTROL 10ML LL (SYRINGE) ×3 IMPLANT
TOWEL GREEN STERILE (TOWEL DISPOSABLE) ×3 IMPLANT
TOWEL GREEN STERILE FF (TOWEL DISPOSABLE) ×3 IMPLANT
VALVE REPLACEMENT CAP (MISCELLANEOUS) IMPLANT
WATER STERILE IRR 1000ML POUR (IV SOLUTION) ×3 IMPLANT

## 2018-08-20 NOTE — Anesthesia Preprocedure Evaluation (Signed)
Anesthesia Evaluation  Patient identified by MRN, date of birth, ID band Patient awake    Reviewed: Allergy & Precautions, NPO status , Patient's Chart, lab work & pertinent test results  History of Anesthesia Complications Negative for: history of anesthetic complications  Airway Mallampati: II  TM Distance: >3 FB Neck ROM: Full    Dental  (+) Teeth Intact, Dental Advisory Given   Pulmonary pneumonia, resolved, Current Smoker and Patient abstained from smoking., former smoker,    breath sounds clear to auscultation       Cardiovascular Exercise Tolerance: Good negative cardio ROS  I Rhythm:Regular Rate:Normal     Neuro/Psych  Headaches, PSYCHIATRIC DISORDERS Anxiety Depression    GI/Hepatic negative GI ROS, Neg liver ROS, Probable UGI bleed -here for EGD   Endo/Other  negative endocrine ROS  Renal/GU negative Renal ROS  negative genitourinary   Musculoskeletal  (+) Arthritis , Osteoarthritis,    Abdominal   Peds negative pediatric ROS (+)  Hematology negative hematology ROS (+) anemia , Received 4 units - last ~10/30  Has L sided Chest Pigtail cath -states drained yesterday -Malignant pleural eff ?-S/P VATS -07/13/18   Anesthesia Other Findings   Reproductive/Obstetrics negative OB ROS                             Anesthesia Physical Anesthesia Plan  ASA: III  Anesthesia Plan: General   Post-op Pain Management:    Induction: Intravenous  PONV Risk Score and Plan: 3 and Treatment may vary due to age or medical condition, Ondansetron and Dexamethasone  Airway Management Planned: LMA  Additional Equipment: None  Intra-op Plan:   Post-operative Plan: Extubation in OR  Informed Consent: I have reviewed the patients History and Physical, chart, labs and discussed the procedure including the risks, benefits and alternatives for the proposed anesthesia with the patient or  authorized representative who has indicated his/her understanding and acceptance.     Dental advisory given  Plan Discussed with: CRNA and Surgeon  Anesthesia Plan Comments: (Patient is post sedation from IR procedure. Awake and oriented. Discussed anesthesia risks and benefits with patient and her sister (via phone) Danford Bad [phone])        Anesthesia Quick Evaluation

## 2018-08-20 NOTE — Anesthesia Procedure Notes (Signed)
Procedure Name: Intubation Date/Time: 08/20/2018 2:56 PM Performed by: Inda Coke, CRNA Pre-anesthesia Checklist: Patient identified, Emergency Drugs available, Suction available and Patient being monitored Patient Re-evaluated:Patient Re-evaluated prior to induction Oxygen Delivery Method: Circle System Utilized Preoxygenation: Pre-oxygenation with 100% oxygen Induction Type: IV induction Ventilation: Mask ventilation without difficulty LMA: LMA inserted LMA Size: 4.0 Tube type: Oral Number of attempts: 1 Airway Equipment and Method: Stylet and Oral airway Placement Confirmation: positive ETCO2 and breath sounds checked- equal and bilateral Tube secured with: Tape Dental Injury: Teeth and Oropharynx as per pre-operative assessment

## 2018-08-20 NOTE — H&P (Signed)
Referring Physician(s): Katragadda,Sreedhar  Supervising Physician: Corrie Mckusick  Patient Status:  [facility] OP  Chief Complaint:  "I'm getting a port a cath"  Subjective Patient familiar to IR service from left thoracentesis on 07/07/2018, PICC placement on 07/08/2018, left chest drain placement on 07/08/2018, splenic artery coil embolization on 08/01/2018 following splenic rupture and subcapsular hematoma.  She has a history of stage IV ovarian cancer.  She is status post left VATS with left Pleurx catheter placement and talc pleurodesis on 07/13/2018 by Dr. Darcey Nora.  She presents again today for Port-A-Cath placement for chemotherapy.  She was scheduled to undergo left Pleurx catheter removal today as well by TCTS , however patient could not tolerate procedure under local anesthetic and is tentatively scheduled for removal in OR after port placement today.  She denies fever, worsening dyspnea, cough, back pain, vomiting or bleeding.  She does have a headache, pain at left Pleurx catheter entry site left upper abdominal region and intermittent nausea.  Past Medical History:  Diagnosis Date  . Anxiety and depression   . Arthritis of facet joints at multiple vertebral levels    L5-S1  . Constipation   . Dyslipidemia   . Family history of breast cancer   . Family history of uterine cancer   . Insomnia   . Irritable bowel syndrome   . Migraine   . Muscle tension headache   . Ovarian carcinoma ([place])   . Plantar fasciitis of right foot   . Port-A-Cath in place 08/20/2018   Past Surgical History:  Procedure Laterality Date  . CHOLECYSTECTOMY  2008  . COLONOSCOPY N/A 08/13/2013   Procedure: COLONOSCOPY;  Surgeon: Rogene Houston, MD;  [facility];  Service: Endoscopy;  Laterality: N/A;  230-moved to 145 Ann to notify pt  . ESOPHAGOGASTRODUODENOSCOPY    . ESOPHAGOGASTRODUODENOSCOPY (EGD) WITH PROPOFOL N/A 07/20/2018   Procedure: ESOPHAGOGASTRODUODENOSCOPY (EGD) WITH PROPOFOL;   Surgeon: Rogene Houston, MD;  [facility];  Service: Endoscopy;  Laterality: N/A;  Possible esophageal dilation.  . IR ANGIOGRAM SELECTIVE EACH ADDITIONAL VESSEL  08/01/2018  . IR ANGIOGRAM VISCERAL SELECTIVE  08/01/2018  . IR EMBO ART  VEN HEMORR LYMPH EXTRAV  INC GUIDE ROADMAPPING  08/01/2018  . IR PERC PLEURAL DRAIN W/INDWELL CATH W/IMG GUIDE  07/08/2018  . IR THORACENTESIS ASP PLEURAL SPACE W/IMG GUIDE  07/07/2018  . IR US GUIDE VASC ACCESS RIGHT  08/01/2018  . PLEURAL EFFUSION DRAINAGE Left 07/13/2018   Procedure: DRAINAGE OF LOCULATED PLEURAL EFFUSION;  Surgeon: Ivin Poot, MD;  Location: [place];  Service: Thoracic;  Laterality: Left;  . TALC PLEURODESIS Left 07/13/2018   Procedure: Talc Pleuradesis;  Surgeon: Prescott Gum, Collier Salina, MD;  Location: [place];  Service: Thoracic;  Laterality: Left;  . TUBAL LIGATION Bilateral   . UTERINE ABLATION    . VIDEO ASSISTED THORACOSCOPY Left 07/13/2018   Procedure: VIDEO ASSISTED THORACOSCOPY;  Surgeon: Ivin Poot, MD;  Location: [place];  Service: Thoracic;  Laterality: Left;      Allergies: Morphine and related, Nortriptyline, Topamax [topiramate], Actifed cold-allergy [chlorpheniramine-phenyleph er], Amoxicillin, Codeine, Erythromycin, Penicillins, and Sudafed [pseudoephedrine hcl]  Medications: Prior to Admission medications   Medication Sig Start Date End Date Taking? Authorizing Provider  benzonatate (TESSALON PERLES) 100 MG capsule Take 1 capsule (100 mg total) by mouth 3 (three) times daily as needed for cough. Patient not taking: Reported on 08/19/2018 07/16/18 07/16/19  Donne Hazel, MD  CARBOPLATIN IV Inject into the vein  every 21 ( twenty-one) days. 08/24/18   [provider]  diazepam (VALIUM) 5 MG tablet TAKE ONE TABLET BY MOUTH AT BEDTIME AS NEEDED FOR ANXIETY 07/27/18   Kathrynn Ducking, MD  docusate sodium (COLACE) 100 MG capsule Take 1 capsule (100 mg total) by mouth 2 (two) times daily. 07/16/18 10/14/18  Donne Hazel, MD  gabapentin (NEURONTIN) 300 MG capsule Take 1 capsule (300 mg total) by mouth 3 (three) times daily. 08/04/18   Mercy Riding, MD  HYDROmorphone (DILAUDID) 4 MG tablet Take 1 tablet (4 mg total) by mouth every 4 (four) hours as needed for severe pain. 08/18/18   Derek Jack, MD  lidocaine-prilocaine (EMLA) cream Apply small amount to port a cath site and cover with plastic wrap one hour prior to chemotherapy appointments 08/20/18   Derek Jack, MD  Oxycodone HCl 10 MG TABS Take 1 tablet (10 mg total) by mouth every 4 (four) hours as needed. Patient not taking: Reported on 08/19/2018 08/11/18   Derek Jack, MD  PACLITAXEL IV Inject into the vein every 21 ( twenty-one) days. 08/24/18   [provider]  pantoprazole (PROTONIX) 40 MG tablet Take 1 tablet (40 mg total) by mouth 2 (two) times daily before a meal. 07/21/18 08/20/18  Manuella Ghazi, Pratik D, DO  polyethylene glycol (MIRALAX / GLYCOLAX) 17 g packet Take 17 g by mouth as needed.    [provider]  prochlorperazine (COMPAZINE) 10 MG tablet Take 1 tablet (10 mg total) by mouth every 6 (six) hours as needed (Nausea or vomiting). 08/20/18   Derek Jack, MD  promethazine (PHENERGAN) 25 MG tablet Take 1 tablet (25 mg total) by mouth every 6 (six) hours as needed for nausea, vomiting or refractory nausea / vomiting. 07/16/18   Donne Hazel, MD  SUMAtriptan (IMITREX) 100 MG tablet TAKE ONE TABLET BY MOUTH PRN UP to TWICE DAILY AS NEEDED. 08/12/18   Suzzanne Cloud, NP     Vital Signs: Blood pressure 114/63, heart rate 84, respirations 18, O2 sat 95% room air, temp 98.5   Physical Exam awake, alert.  Chest with diminished breath sounds left base, right clear.  Heart with regular rate/ rhythm.  Abdomen soft, positive bowel sounds, some tenderness at left Pleurx catheter entry site left upper abdominal region.  Clean bandage in place; no lower extremity edema.  Imaging: Dg Chest 2 View  Result Date:  08/19/2018 CLINICAL DATA:  Left pleural effusion EXAM: CHEST - 2 VIEW COMPARISON:  08/04/2018 FINDINGS: No significant change in a moderate left-sided pleural effusion and associated atelectasis or consolidation with tunneled pleural catheter. The right lung is normally aerated. The heart is predominantly obscured by effusion. Disc degenerative disease of the thoracic spine. IMPRESSION: No significant change in a moderate left-sided pleural effusion and associated atelectasis or consolidation with tunneled pleural catheter. The right lung is normally aerated. The heart is predominantly obscured by effusion. Disc degenerative disease of the thoracic spine. Electronically Signed   By: Eddie Candle M.D.   On: 08/19/2018 12:56    Labs:  CBC: Recent Labs    08/01/18 1212 08/02/18 1019 08/14/18 1429 08/20/18 1022  WBC 8.8 10.0 9.3 9.7  HGB 11.6* 12.6 13.7 13.1  HCT 36.7 38.8 44.8 41.9  PLT 667* 384 581* 585*    COAGS: Recent Labs    07/07/18 0010 07/12/18 1219 07/13/18 0441 07/18/18 1425 07/31/18 1500 08/20/18 1022  INR 1.0 1.1 1.0 1.1 1.1 1.1  APTT 30 28  --   --   --   --  BMP: Recent Labs    07/31/18 1500 08/02/18 1019 08/14/18 1429 08/20/18 1022  NA 136 134* 139 137  K 3.8 4.8 4.2 4.1  CL 101 100 102 101  CO2 25 22 28 25   GLUCOSE 95 97 107* 94  BUN 9 <5* 8 7  CALCIUM 9.0 9.1 9.4 9.4  CREATININE 0.63 0.75 0.70 0.66  GFRNONAA >60 >60 >60 >60  GFRAA >60 >60 >60 >60    LIVER FUNCTION TESTS: Recent Labs    07/15/18 0424 07/18/18 1425 07/31/18 1500 08/02/18 1019 08/14/18 1429  BILITOT 0.5 0.7 0.9  --  0.6  AST 19 32 25  --  36  ALT 53* 48* 27  --  27  ALKPHOS 203* 242* 138*  --  214*  PROT 5.5* 6.0* 7.1  --  7.0  ALBUMIN 2.2* 2.6* 3.2* 2.7* 3.2*    Assessment and Plan: Pt with history of stage IV ovarian cancer.  Also with prior history of splenic artery coil embolization by IR on 08/01/2018 following splenic rupture and subcapsular hematoma.  She is status  post left VATS with left Pleurx catheter placement and talc pleurodesis on 07/13/2018 by Dr. Darcey Nora.  She presents  today for Port-A-Cath placement for chemotherapy.  She was scheduled to undergo left Pleurx catheter removal today as well by TCTS , however patient could not tolerate procedure under local anesthetic and is tentatively scheduled for removal in OR after port placement today. Risks and benefits of image guided port-a-catheter placement was discussed with the patient including, but not limited to bleeding, infection, pneumothorax, or fibrin sheath development and need for additional procedures.  All of the patient's questions were answered, patient is agreeable to proceed. Consent signed and in chart.     Electronically Signed: D. Rowe Robert, PA-C 08/20/2018, 11:58 AM   I spent a total of 25 minutes at the the patient's bedside AND on the patient's hospital floor or unit, greater than 50% of which was counseling/coordinating care for Port-A-Cath placement

## 2018-08-20 NOTE — Op Note (Signed)
  CT surgery procedure note  Procedure-attempted removal of left Pleurx catheter Preop diagnosis history of ovarian cancer with malignant left pleural effusion Surgeon Dahlia Byes MD  Anesthesia local 1% lidocaine  Procedure- After informed consent was documented in the short stay treatment room and proper site Mark placed the left chest was prepped and draped around the Pleurx catheter as a sterile field.  A proper timeout was performed.  Lidocaine 1% 10 mL was infiltrated into the exit site of the Pleurx catheter after the suture had been removed.  The subcutaneous tunnel was freed from the Silastic catheter and tension was applied.  The patient at this point had severe pain and was unable to hold still and the procedure was stopped.  The patient be scheduled to have the Pleurx catheter removed under general anesthesia in the operating room hopefully later today.  She understands this plan and agrees.

## 2018-08-20 NOTE — H&P (Signed)
PCP is [name], MD Referring Provider is No ref. provider found  No chief complaint on file.   HPI: Patient returns for routine follow-up 1 month after left VATS for large left pleural effusion which was found to be secondary to ovarian cancer.  Talc  pleurodesis and a  LeftPleurx catheter placed at the same time.  For the past 2 weeks there is been scant drainage from the catheter.  Her chest x-ray is stable with a mild loculated basilar pleural effusion.  She is not short of breath and her oxygen saturation room air is 94%.  The patient was scheduled for removal of the left Pleurx catheter as an outpatient at the [facility] short stay tomorrow.  I discussed the procedure of left Pleurx catheter removal including the use of local anesthesia, the incision, and the suture closure of the exit site.  She agrees to proceed.   The attempt to remove the Pleurx catheter in the short stay treatment room was unsuccessful because of patient pain and movement.  For safety and patient comfort the patient will be later scheduled for Pleurx catheter removal in the operating room under MAC anesthesia.  She will remain n.p.o.  The catheter was covered with a sterile dressing.  Blood loss was minimal. Past Medical History:  Diagnosis Date  . Anxiety and depression   . Arthritis of facet joints at multiple vertebral levels    L5-S1  . Constipation   . Dyslipidemia   . Family history of breast cancer   . Family history of uterine cancer   . Insomnia   . Irritable bowel syndrome   . Migraine   . Muscle tension headache   . Ovarian carcinoma ([place])   . Plantar fasciitis of right foot   . Port-A-Cath in place 08/20/2018    Past Surgical History:  Procedure Laterality Date  . CHOLECYSTECTOMY  2008  . COLONOSCOPY N/A 08/13/2013   Procedure: COLONOSCOPY;  Surgeon: Rogene Houston, MD;  [facility];  Service: Endoscopy;  Laterality: N/A;  230-moved to 145 Ann to notify pt  .  ESOPHAGOGASTRODUODENOSCOPY    . ESOPHAGOGASTRODUODENOSCOPY (EGD) WITH PROPOFOL N/A 07/20/2018   Procedure: ESOPHAGOGASTRODUODENOSCOPY (EGD) WITH PROPOFOL;  Surgeon: Rogene Houston, MD;  [facility];  Service: Endoscopy;  Laterality: N/A;  Possible esophageal dilation.  . IR ANGIOGRAM SELECTIVE EACH ADDITIONAL VESSEL  08/01/2018  . IR ANGIOGRAM VISCERAL SELECTIVE  08/01/2018  . IR EMBO ART  VEN HEMORR LYMPH EXTRAV  INC GUIDE ROADMAPPING  08/01/2018  . IR PERC PLEURAL DRAIN W/INDWELL CATH W/IMG GUIDE  07/08/2018  . IR THORACENTESIS ASP PLEURAL SPACE W/IMG GUIDE  07/07/2018  . IR US GUIDE VASC ACCESS RIGHT  08/01/2018  . PLEURAL EFFUSION DRAINAGE Left 07/13/2018   Procedure: DRAINAGE OF LOCULATED PLEURAL EFFUSION;  Surgeon: Ivin Poot, MD;  Location: [place];  Service: Thoracic;  Laterality: Left;  . TALC PLEURODESIS Left 07/13/2018   Procedure: Talc Pleuradesis;  Surgeon: Prescott Gum, Collier Salina, MD;  Location: [place];  Service: Thoracic;  Laterality: Left;  . TUBAL LIGATION Bilateral   . UTERINE ABLATION    . VIDEO ASSISTED THORACOSCOPY Left 07/13/2018   Procedure: VIDEO ASSISTED THORACOSCOPY;  Surgeon: Ivin Poot, MD;  Location: [facility] OR;  Service: Thoracic;  Laterality: Left;    Family History  Problem Relation Age of Onset  . Hypertension Mother   . Obesity Mother   . Diabetes Mother   . Kidney disease Mother   .  Peripheral vascular disease Father   . Atrial fibrillation Father   . COPD Brother   . Osteoporosis Brother   . Crohn's disease Sister   . Uterine cancer Sister 36       maternal half sister  . Breast cancer Maternal Aunt 41  . Colon cancer Neg Hx     Social History Social History   Tobacco Use  . Smoking status: Former Smoker    Packs/day: 0.50    Years: 17.00    Pack years: 8.50    Types: Cigarettes    Quit date: 06/22/2018    Years since quitting: 0.1  . Smokeless tobacco: Never Used  Substance Use Topics  . Alcohol use: Yes    Alcohol/week: 1.0  standard drinks    Types: 1 Glasses of wine per week    Comment: Drinks alcohol on occasion  . Drug use: No    Current Facility-Administered Medications  Medication Dose Route Frequency Provider Last Rate Last Dose  . 0.9 %  sodium chloride infusion   Intravenous Continuous Rourk, Cristopher Estimable, MD      . 0.9 %  sodium chloride infusion   Intravenous Continuous Monia Sabal, PA-C      . lidocaine (PF) (XYLOCAINE) 1 % injection           . vancomycin (VANCOCIN) IVPB 1000 mg/200 mL premix  1,000 mg Intravenous to Edward Jolly, PA-C        Allergies  Allergen Reactions  . Morphine And Related Itching  . Nortriptyline Other (See Comments)    Significant weight gain  . Topamax [Topiramate] Diarrhea    nausea  . Actifed Cold-Allergy [Chlorpheniramine-Phenyleph Er] Rash  . Amoxicillin Rash    Did it involve swelling of the face/tongue/throat, SOB, or low BP? Unknown Did it involve sudden or severe rash/hives, skin peeling, or any reaction on the inside of your mouth or nose? Unknown Did you need to seek medical attention at a hospital or doctor's office? Unknown When did it last happen? teenager If all above answers are "NO", may proceed with cephalosporin use.   . Codeine Hives  . Erythromycin Rash  . Penicillins Rash    Did it involve swelling of the face/tongue/throat, SOB, or low BP? Unknown Did it involve sudden or severe rash/hives, skin peeling, or any reaction on the inside of your mouth or nose? Unknown Did you need to seek medical attention at a hospital or doctor's office? Unknown When did it last happen? teenager If all above answers are "NO", may proceed with cephalosporin use.   [name] [Pseudoephedrine Hcl] Rash    Review of Systems  Patient is taking Neurontin for her post left VATS neuropathic pain. The patient is taking Dilaudid for abdominal pain from her ovarian cancer Patient is not taking any blood thinners. BP 114/63   Pulse 86   Temp  98.5 F (36.9 C)   Resp 18   SpO2 95%  Physical Exam Patient comfortable without shortness of breath Slightly diminished breath sounds left base Left VATS incision well-healed Left Pleurx catheter dressing dry and intact No peripheral edema Heart rhythm regular without murmur  Diagnostic Tests: Chest x-ray today personally reviewed showing the catheter in good position but with a loculated small effusion and pleural thickening  Impression: Scant drainage from Pleurx catheter which is not providing any benefit to the patient.  We will plan on scheduling removal of the left catheter: Outpatient area treatment room tomorrow under local anesthesia.  Plan: Patient  will be in the short stay area to get a  Port-A-Cath placed by interventional radiology.  Following that procedure the patient be taken to the operating room for removal of left Pleurx catheter under MAC anesthesia. Len Childs, MD [facility] [phone]

## 2018-08-20 NOTE — Op Note (Signed)
Procedure-removal of left Pleurx catheter Surgeon-Peter Vantrigt MD Anesthesia-General Preoperative diagnosis- previously placed Pleurx catheter for malignant left pleural effusion and history of ovarian cancer  Procedure  After informed consent was documented in the preop holding and proper site of surgery marked and all questions addressed the patient was brought to the operating room.  She is placed on the operating table.  General anesthesia was induced.  She remained stable.  The left chest and Pleurx catheter site were prepped and draped as a sterile field.  A proper timeout was performed.  1% lidocaine was infiltrated in the area of the catheter exit incision. The incision was extended 1 cm. The cuff on the catheter was dissected from the subcutaneous fat which freed up the catheter to allow its removal in its entirety.  Hemostasis was achieved.  The incision was closed in layers with interrupted 3-0 Vicryl for the subcutaneous layer and interrupted 3-0 nylon for the skin.  A sterile dressing was applied.  The patient reversed from anesthesia and returned to recovery room speak breathing spontaneously in stable condition.  Signed Dahlia Byes MD

## 2018-08-20 NOTE — Telephone Encounter (Signed)
LM on VM that results are back and to please call.  Also mentioned that I will be out of the office starting 8/7 and to please call [phone] and ask for another GC to give results.

## 2018-08-20 NOTE — Brief Op Note (Signed)
08/20/2018  3:21 PM  PATIENT:  Michelle Holt  48 y.o. female  PRE-OPERATIVE DIAGNOSIS:  RESOLVED LEFT EFFUSION  POST-OPERATIVE DIAGNOSIS:  RESOLVED LEFT EFFUSION  PROCEDURE:  Procedure(s): REMOVAL OF PLEURAL DRAINAGE CATHETER (Left)  SURGEON:  Surgeon(s) and Role:    Ivin Poot, MD - Primary  PHYSICIAN ASSISTANT:   ASSISTANTS: none   ANESTHESIA:   general  EBL:  1 mL   BLOOD ADMINISTERED:none  DRAINS: none   LOCAL MEDICATIONS USED:  LIDOCAINE  and Amount: 5 ml  SPECIMEN:  No Specimen  DISPOSITION OF SPECIMEN:  N/A  COUNTS:  YES  TOURNIQUET:  * No tourniquets in log *  DICTATION: .Dragon Dictation  PLAN OF CARE: home from PACU  PATIENT DISPOSITION:  PACU - hemodynamically stable.   Delay start of Pharmacological VTE agent (>24hrs) due to surgical blood loss or risk of bleeding: yes

## 2018-08-20 NOTE — Progress Notes (Signed)
MD unsuccessful at removing pleurix drainage catheter in short stay. Plans to take patient to OR this afternoon to remove tube under anesthesia. Site and tube intact. VSS

## 2018-08-20 NOTE — Patient Instructions (Signed)
[facility] Chemotherapy Teaching    You have been diagnosed with metastatic (Stage IV) ovarian cancer.  We will treat you every 21 days with a combination of the chemotherapy drugs paclitaxel (Taxol) and carboplatin.  The intent of treatment is palliative, meaning the purpose is to shrink and control your cancer and prevent it from spreading further.  Treatment may also help alleviate any symptoms you are having as a result of your cancer.  You will see the doctor regularly throughout treatment.  We monitor your lab work prior to every treatment. The doctor monitors your response to treatment by the way you are feeling, your blood work, and scans periodically.  There will be wait times while you are here for treatment.  It will take about 30 minutes to 1 hour for your lab work to result.  Then there will be wait times while pharmacy mixes your medications.   Medications you will receive in the clinic prior to your chemotherapy medications:  Aloxi:  ALOXI is used in adults to help prevent the nausea and vomiting that happens with certain anti-cancer medicines (chemotherapy).  Aloxi is a long acting medication, and will remain in your system for 24-36 hours.   Emend:  This is an anti-nausea medication that is used with Aloxi to help prevent nausea and vomiting caused by chemotherapy.  Dexamethasone:  This is a steroid given prior to chemotherapy to help prevent allergic reactions; it may also help prevent and control nausea and diarrhea.   Pepcid:  This medication is a histamine blocker that helps prevent and allergic reaction to your chemotherapy.   Benadryl:  This is a histamine blocker (different from the Pepcid) that helps prevent allergic/infusion reactions to your chemotherapy. This medication may cause dizziness/drowsiness.    Paclitaxel (Taxol)  About This Drug Paclitaxel is a drug used to treat cancer. It is given in the vein (IV).  This will take 1 hour to infuse.  This  first infusion will take longer to infuse because it is increased slowly to monitor for reactions.  The nurse will be in the room with you for the first 15 minutes of the first infusion.  Possible Side Effects  . Hair loss. Hair loss is often temporary, although with certain medicine, hair loss can sometimes be permanent. Hair loss may happen suddenly or gradually. If you lose hair, you may lose it from your head, face, armpits, pubic area, chest, and/or legs. You may also notice your hair getting thin.  . Swelling of your legs, ankles and/or feet (edema)  . Flushing  . Nausea and throwing up (vomiting)  . Loose bowel movements (diarrhea)  . Bone marrow depression. This is a decrease in the number of white blood cells, red blood cells, and platelets. This may raise your risk of infection, make you tired and weak (fatigue), and raise your risk of bleeding.  . Effects on the nerves are called peripheral neuropathy. You may feel numbness, tingling, or pain in your hands and feet. It may be hard for you to button your clothes, open jars, or walk as usual. The effect on the nerves may get worse with more doses of the drug. These effects get better in some people after the drug is stopped but it does not get better in all people.  . Changes in your liver function  . Bone, joint and muscle pain  . Abnormal EKG  . Allergic reaction: Allergic reactions, including anaphylaxis are rare but may happen in some  patients. Signs of allergic reaction to this drug may be swelling of the face, feeling like your tongue or throat are swelling, trouble breathing, rash, itching, fever, chills, feeling dizzy, and/or feeling that your heart is beating in a fast or not normal way. If this happens, do not take another dose of this drug. You should get urgent medical treatment.  . Infection  . Changes in your kidney function.  Note: Each of the side effects above was reported in 20% or greater of patients treated  with paclitaxel. Not all possible side effects are included above.  Warnings and Precautions  . Severe allergic reactions  . Severe bone marrow depression  Treating Side Effects  . To help with hair loss, wash with a mild shampoo and avoid washing your hair every day.  . Avoid rubbing your scalp, instead, pat your hair or scalp dry  . Avoid coloring your hair  . Limit your use of hair spray, electric curlers, blow dryers, and curling irons.  . If you are interested in getting a wig, talk to your nurse. You can also call the [place] at [phone] to find out information about the "Look Good, Feel Better" program close to where you live. It is a free program where women getting chemotherapy can learn about wigs, turbans and scarves as well as makeup techniques and skin and nail care.  . Ask your doctor or nurse about medicines that are available to help stop or lessen diarrhea and/or nausea.  . To help with nausea and vomiting, eat small, frequent meals instead of three large meals a day. Choose foods and drinks that are at room temperature. Ask your nurse or doctor about other helpful tips and medicine that is available to help or stop lessen these symptoms.  . If you get diarrhea, eat low-fiber foods that are high in protein and calories and avoid foods that can irritate your digestive tracts or lead to cramping. Ask your nurse or doctor about medicine that can lessen or stop your diarrhea.  . Mouth care is very important. Your mouth care should consist of routine, gentle cleaning of your teeth or dentures and rinsing your mouth with a mixture of 1/2 teaspoon of salt in 8 ounces of water or  teaspoon of baking soda in 8 ounces of water. This should be done at least after each meal and at bedtime.  . If you have mouth sores, avoid mouthwash that has alcohol. Also avoid alcohol and smoking because they can bother your mouth and throat.  . Drink plenty of fluids (a  minimum of eight glasses per day is recommended).  . Take your temperature as your doctor or nurse tells you, and whenever you feel like you may have a fever.  . Talk to your doctor or nurse about precautions you can take to avoid infections and bleeding.  . Be careful when cooking, walking, and handling sharp objects and hot liquids.  Food and Drug Interactions  . There are no known interactions of paclitaxel with food.  . This drug may interact with other medicines. Tell your doctor and pharmacist about all the medicines and dietary supplements (vitamins, minerals, herbs and others) that you are taking at this time.  . The safety and use of dietary supplements and alternative diets are often not known. Using these might affect your cancer or interfere with your treatment. Until more is known, you should not use dietary supplements or alternative diets without your cancer doctor's help.  When to Call the Doctor  Call your doctor or nurse if you have any of the following symptoms and/or any new or unusual symptoms:  . Fever of 100.5 F (38 C) or above  . Chills  . Redness, pain, warmth, or swelling at the IV site during the infusion  . Signs of allergic reaction: swelling of the face, feeling like your tongue or throat are swelling, trouble breathing, rash, itching, fever, chills, feeling dizzy, and/or feeling that your heart is beating in a fast or not normal way  . Feeling that your heart is beating in a fast or not normal way (palpitations)  . Weight gain of 5 pounds in one week (fluid retention)  . Decreased urine or very dark urine  . Signs of liver problems: dark urine, pale bowel movements, bad stomach pain, feeling very tired and weak, unusual  itching, or yellowing of the eyes or skin  . Heavy menstrual period that lasts longer than normal  . Easy bruising or bleeding  . Nausea that stops you from eating or drinking, and/or that is not relieved by prescribed  medicines.  . Loose bowel movements (diarrhea) more than 4 times a day or diarrhea with weakness or lightheadedness  . Pain in your mouth or throat that makes it hard to eat or drink  . Lasting loss of appetite or rapid weight loss of five pounds in a week  . Signs of peripheral neuropathy: numbness, tingling, or decreased feeling in fingers or toes; trouble walking or changes in the way you walk; or feeling clumsy when buttoning clothes, opening jars, or other routine activities  . Joint and muscle pain that is not relieved by prescribed medicines  . Extreme fatigue that interferes with normal activities  . While you are getting this drug, please tell your nurse right away if you have any pain, redness, or swelling at the site of the IV infusion.  . If you think you are pregnant.  Reproduction Warnings  . Pregnancy warning: This drug may have harmful effects on the unborn child, it is recommended that effective methods of birth control should be used during your cancer treatment. Let your doctor know right away if you think you may be pregnant.  . Breast feeding warning: Women should not breast feed during treatment because this drug could enter the breastmilk and cause harm to a breast feeding baby.   Carboplatin (Paraplatin, CBDCA)  About This Drug Carboplatin is used to treat cancer. It is given in the vein through your port a cath.  It will take 30 minutes to infuse. You will receive this medication on Days 1-4 (Monday-Thursday) of each cycle.    Possible Side Effects . Bone marrow suppression. This is a decrease in the number of white blood cells, red blood cells, and platelets. This may raise your risk of infection, make you tired and weak (fatigue), and raise your risk of bleeding. . Nausea and vomiting (throwing up) . Weakness . Changes in your liver function . Changes in your kidney function . Electrolyte changes . Pain  Note: Each of the side effects above was  reported in 20% or greater of patients treated with carboplatin. Not all possible side effects are included above.  Warnings and Precautions . Severe bone marrow suppression . Allergic reactions, including anaphylaxis are rare but may happen in some patients. Signs of allergic reaction to this drug may be swelling of the face, feeling like your tongue or throat are swelling, trouble breathing, rash,  itching, fever, chills, feeling dizzy, and/or feeling that your heart is beating in a fast or not normal way. If this happens, do not take another dose of this drug. You should get urgent medical treatment. . Severe nausea and vomiting . Effects on the nerves are called peripheral neuropathy. This risk is increased if you are over the age of 58 or if you have received other medicine with risk of peripheral neuropathy. You may feel numbness, tingling, or pain in your hands and feet. It may be hard for you to button your clothes, open jars, or walk as usual. The effect on the nerves may get worse with more doses of the drug. These effects get better in some people after the drug is stopped but it does not get better in all people. Marland Kitchen Blurred vision, loss of vision or other changes in eyesight . Decreased hearing . Skin and tissue irritation including redness, pain, warmth, or swelling at the IV site if the drug leaks out of the vein and into nearby tissue. . Severe changes in your kidney function, which can cause kidney failure . Severe changes in your liver function, which can cause liver failure  Note: Some of the side effects above are very rare. If you have concerns and/or questions, please discuss them with your medical team.  Important Information . This drug may be present in the saliva, tears, sweat, urine, stool, vomit, semen, and vaginal secretions. Talk to your doctor and/or your nurse about the necessary precautions to take during this time.  Treating Side Effects . Manage  tiredness by pacing your activities for the day. . Be sure to include periods of rest between energy-draining activities. . To decrease the risk of infection, wash your hands regularly. . Avoid close contact with people who have a cold, the flu, or other infections. . Take your temperature as your doctor or nurse tells you, and whenever you feel like you may have a fever. . To help decrease the risk of bleeding, use a soft toothbrush. Check with your nurse before using dental floss. . Be very careful when using knives or tools. . Use an electric shaver instead of a razor. . Drink plenty of fluids (a minimum of eight glasses per day is recommended). . If you throw up or have loose bowel movements, you should drink more fluids so that you do not become dehydrated (lack of water in the body from losing too much fluid). . To help with nausea and vomiting, eat small, frequent meals instead of three large meals a day. Choose foods and drinks that are at room temperature. Ask your nurse or doctor about other helpful tips and medicine that is available to help stop or lessen these symptoms. . If you have numbness and tingling in your hands and feet, be careful when cooking, walking, and handling sharp objects and hot liquids. Marland Kitchen Keeping your pain under control is important to your well-being. Please tell your doctor or nurse if you are experiencing pain.  Food and Drug Interactions . There are no known interactions of carboplatin with food. . This drug may interact with other medicines. Tell your doctor and pharmacist about all the prescription and over-the-counter medicines and dietary supplements (vitamins, minerals, herbs and others) that you are taking at this time. Also, check with your doctor or pharmacist before starting any new prescription or over-the-counter medicines, or dietary supplements to make sure that there are no interactions.  When to Call the Doctor Call your doctor  or nurse  if you have any of these symptoms and/or any new or unusual symptoms: . Fever of 100.4 F (38 C) or higher . Chills . Tiredness that interferes with your daily activities . Feeling dizzy or lightheaded . Easy bleeding or bruising . Nausea that stops you from eating or drinking and/or is not relieved by prescribed medicines . Throwing up more than 3 times a day . Blurred vision or other changes in eyesight . Decrease in hearing or ringing in the ear . Signs of allergic reaction: swelling of the face, feeling like your tongue or throat are swelling, trouble breathing, rash, itching, fever, chills, feeling dizzy, and/or feeling that your heart is beating in a fast or not normal way. If this happens, call 911 for emergency care. . While you are getting this drug, please tell your nurse right away if you have any pain, redness, or swelling at the site of the IV infusion . Signs of possible liver problems: dark urine, pale bowel movements, bad stomach pain, feeling very tired and weak, unusual itching, or yellowing of the eyes or skin . Decreased urine, or very dark urine . Numbness, tingling, or pain in your hands and feet . Pain that does not go away or is not relieved by prescribed medicine . If you think you may be pregnant  Reproduction Warnings . Pregnancy warning: This drug may have harmful effects on the unborn baby. Women of child bearing potential should use effective methods of birth control during your cancer treatment. Let your doctor know right away if you think you may be pregnant. . Breastfeeding warning: It is not known if this drug passes into breast milk. For this reason, women should not breastfeed during treatment because this drug could enter the breast milk and cause harm to a breastfeeding baby. . Fertility warning: Human fertility studies have not been done with this drug. Talk with your doctor or nurse if you plan to have children. Ask for information on sperm or  egg banking.   SELF CARE ACTIVITIES WHILE ON CHEMOTHERAPY:  Hydration Increase your fluid intake 48 hours prior to treatment and drink at least 8 to 12 cups (64 ounces) of water/decaffeinated beverages per day after treatment. You can still have your cup of coffee or soda but these beverages do not count as part of your 8 to 12 cups that you need to drink daily. No alcohol intake.  Medications Continue taking your normal prescription medication as prescribed.  If you start any new herbal or new supplements please let us know first to make sure it is safe.  Mouth Care Have teeth cleaned professionally before starting treatment. Keep dentures and partial plates clean. Use soft toothbrush and do not use mouthwashes that contain alcohol. Biotene is a good mouthwash that is available at most pharmacies or may be ordered by calling [phone]. Use warm salt water gargles (1 teaspoon salt per 1 quart warm water) before and after meals and at bedtime. If you need dental work, please let the doctor know before you go for your appointment so that we can coordinate the best possible time for you in regards to your chemo regimen. You need to also let your dentist know that you are actively taking chemo. We may need to do labs prior to your dental appointment.  Skin Care Always use sunscreen that has not expired and with SPF (Sun Protection Factor) of 50 or higher. Wear hats to protect your head from the sun. Remember  to use sunscreen on your hands, ears, face, & feet.  Use good moisturizing lotions such as udder cream, eucerin, or even Vaseline. Some chemotherapies can cause dry skin, color changes in your skin and nails.    . Avoid long, hot showers or baths. . Use gentle, fragrance-free soaps and laundry detergent. . Use moisturizers, preferably creams or ointments rather than lotions because the thicker consistency is better at preventing skin dehydration. Apply the cream or ointment within 15 minutes  of showering. Reapply moisturizer at night, and moisturize your hands every time after you wash them.  Hair Loss (if your doctor says your hair will fall out)  . If your doctor says that your hair is likely to fall out, decide before you begin chemo whether you want to wear a wig. You may want to shop before treatment to match your hair color. . Hats, turbans, and scarves can also camouflage hair loss, although some people prefer to leave their heads uncovered. If you go bare-headed outdoors, be sure to use sunscreen on your scalp. . Cut your hair short. It eases the inconvenience of shedding lots of hair, but it also can reduce the emotional impact of watching your hair fall out. . Don't perm or color your hair during chemotherapy. Those chemical treatments are already damaging to hair and can enhance hair loss. Once your chemo treatments are done and your hair has grown back, it's OK to resume dyeing or perming hair.  With chemotherapy, hair loss is almost always temporary. But when it grows back, it may be a different color or texture. In older adults who still had hair color before chemotherapy, the new growth may be completely gray.  Often, new hair is very fine and soft.  Infection Prevention Please wash your hands for at least 30 seconds using warm soapy water. Handwashing is the #1 way to prevent the spread of germs. Stay away from sick people or people who are getting over a cold. If you develop respiratory systems such as green/yellow mucus production or productive cough or persistent cough let us know and we will see if you need an antibiotic. It is a good idea to keep a pair of gloves on when going into grocery stores/[employer] to decrease your risk of coming into contact with germs on the carts, etc. Carry alcohol hand gel with you at all times and use it frequently if out in public. If your temperature reaches 100.5 or higher please call the clinic and let us know.  If it is after hours or on  the weekend please go to the ER if your temperature is over 100.5.  Please have your own personal thermometer at home to use.    Sex and bodily fluids If you are going to have sex, a condom must be used to protect the person that isn't taking chemotherapy. Chemo can decrease your libido (sex drive). For a few days after chemotherapy, chemotherapy can be excreted through your bodily fluids.  When using the toilet please close the lid and flush the toilet twice.  Do this for a few day after you have had chemotherapy.   Effects of chemotherapy on your sex life Some changes are simple and won't last long. They won't affect your sex life permanently.  Sometimes you may feel: . too tired . not strong enough to be very active . sick or sore  . not in the mood . anxious or low Your anxiety might not seem related to sex. For  example, you may be worried about the cancer and how your treatment is going. Or you may be worried about money, or about how you family are coping with your illness. These things can cause stress, which can affect your interest in sex. It's important to talk to your partner about how you feel. Remember - the changes to your sex life don't usually last long. There's usually no medical reason to stop having sex during chemo. The drugs won't have any long term physical effects on your performance or enjoyment of sex. Cancer can't be passed on to your partner during sex  Contraception It's important to use reliable contraception during treatment. Avoid getting pregnant while you or your partner are having chemotherapy. This is because the drugs may harm the baby. Sometimes chemotherapy drugs can leave a man or woman infertile.  This means you would not be able to have children in the future. You might want to talk to someone about permanent infertility. It can be very difficult to learn that you may no longer be able to have children. Some people find counselling helpful. There might be  ways to preserve your fertility, although this is easier for men than for women. You may want to speak to a fertility expert. You can talk about sperm banking or harvesting your eggs. You can also ask about other fertility options, such as donor eggs. If you have or have had breast cancer, your doctor might advise you not to take the contraceptive pill. This is because the hormones in it might affect the cancer.  It is not known for sure whether or not chemotherapy drugs can be passed on through semen or secretions from the vagina. Because of this some doctors advise people to use a barrier method if you have sex during treatment. This applies to vaginal, anal or oral sex. Generally, doctors advise a barrier method only for the time you are actually having the treatment and for about a week after your treatment. Advice like this can be worrying, but this does not mean that you have to avoid being intimate with your partner. You can still have close contact with your partner and continue to enjoy sex.  Animals If you have cats or birds we just ask that you not change the litter or change the cage.  Please have someone else do this for you while you are on chemotherapy.   Food Safety During and After Cancer Treatment Food safety is important for people both during and after cancer treatment. Cancer and cancer treatments, such as chemotherapy, radiation therapy, and stem cell/bone marrow transplantation, often weaken the immune system. This makes it harder for your body to protect itself from foodborne illness, also called food poisoning. Foodborne illness is caused by eating food that contains harmful bacteria, parasites, or viruses.  Foods to avoid Some foods have a higher risk of becoming tainted with bacteria. These include: Marland Kitchen Unwashed fresh fruit and vegetables, especially leafy vegetables that can hide dirt and other contaminants . Raw sprouts, such as alfalfa sprouts . Raw or undercooked beef,  especially ground beef, or other raw or undercooked meat and poultry . Fatty, fried, or spicy foods immediately before or after treatment.  These can sit heavy on your stomach and make you feel nauseous. . Raw or undercooked shellfish, such as oysters. . Sushi and sashimi, which often contain raw fish.  . Unpasteurized beverages, such as unpasteurized fruit juices, raw milk, raw yogurt, or cider . Undercooked eggs, such as  soft boiled, over easy, and poached; raw, unpasteurized eggs; or foods made with raw egg, such as homemade raw cookie dough and homemade mayonnaise  Simple steps for food safety  Shop smart. . Do not buy food stored or displayed in an unclean area. . Do not buy bruised or damaged fruits or vegetables. . Do not buy cans that have cracks, dents, or bulges. . Pick up foods that can spoil at the end of your shopping trip and store them in a cooler on the way home.  Prepare and clean up foods carefully. . Rinse all fresh fruits and vegetables under running water, and dry them with a clean towel or paper towel. . Clean the top of cans before opening them. . After preparing food, wash your hands for 20 seconds with hot water and soap. Pay special attention to areas between fingers and under nails. . Clean your utensils and dishes with hot water and soap. Marland Kitchen Disinfect your kitchen and cutting boards using 1 teaspoon of liquid, unscented bleach mixed into 1 quart of water.    Dispose of old food. . Eat canned and packaged food before its expiration date (the "use by" or "best before" date). . Consume refrigerated leftovers within 3 to 4 days. After that time, throw out the food. Even if the food does not smell or look spoiled, it still may be unsafe. Some bacteria, such as Listeria, can grow even on foods stored in the refrigerator if they are kept for too long.  Take precautions when eating out. . At restaurants, avoid buffets and salad bars where food sits out for a long time  and comes in contact with many people. Food can become contaminated when someone with a virus, often a norovirus, or another "bug" handles it. . Put any leftover food in a "to-go" container yourself, rather than having the server do it. And, refrigerate leftovers as soon as you get home. . Choose restaurants that are clean and that are willing to prepare your food as you order it cooked.   MEDICATIONS:                                                                                                                                                                     Compazine/Prochlorperazine 10mg  tablet. Take 1 tablet every 6 hours as needed for nausea/vomiting. (This can make you sleepy)         EMLA cream. Apply a quarter size amount to port site 1 hour prior to chemo. Do not rub in. Cover with plastic wrap.      Diarrhea Sheet   If you are having loose stools/diarrhea, please purchase Imodium and begin taking as outlined:  At the first sign of poorly formed or loose stools  you should begin taking Imodium (loperamide) 2 mg capsules.  Take two tablets (4mg ) followed by one tablet (2mg ) every 2 hours - DO NOT EXCEED 8 tablets in 24 hours.  If it is bedtime and you are having loose stools, take 2 tablets at bedtime, then 2 tablets every 4 hours until morning.   Always call the [facility] if you are having loose stools/diarrhea that you can't get under control.  Loose stools/diarrhea leads to dehydration (loss of water) in your body.  We have other options of trying to get the loose stools/diarrhea to stop but you must let us know!   Constipation Sheet  Colace - 100 mg capsules - take 2 capsules daily.  If this doesn't help then you can increase to 2 capsules twice daily.  Please call if the above does not work for you.   Do not go more than 2 days without a bowel movement.  It is very important that you do not become constipated.  It will make you feel sick to your stomach (nausea) and  can cause abdominal pain and vomiting.   Nausea Sheet   Compazine/Prochlorperazine 10mg  tablet. Take 1 tablet every 6 hours as needed for nausea/vomiting. (This can make you sleepy)  If you are having persistent nausea (nausea that does not stop) please call the [facility] and let us know the amount of nausea that you are experiencing.  If you begin to vomit, you need to call the [facility] and if it is the weekend and you have vomited more than one time and can't get it to stop-go to the Emergency Room.  Persistent nausea/vomiting can lead to dehydration (loss of fluid in your body) and will make you feel terrible.   Ice chips, sips of clear liquids, foods that are @ room temperature, crackers, and toast tend to be better tolerated.    SYMPTOMS TO REPORT AS SOON AS POSSIBLE AFTER TREATMENT:   FEVER GREATER THAN 100.5 F  CHILLS WITH OR WITHOUT FEVER  NAUSEA AND VOMITING THAT IS NOT CONTROLLED WITH YOUR NAUSEA MEDICATION  UNUSUAL SHORTNESS OF BREATH  UNUSUAL BRUISING OR BLEEDING  TENDERNESS IN MOUTH AND THROAT WITH OR WITHOUT PRESENCE OF ULCERS  URINARY PROBLEMS  BOWEL PROBLEMS  UNUSUAL RASH      Wear comfortable clothing and clothing appropriate for easy access to any Portacath or PICC line. Let us know if there is anything that we can do to make your therapy better!    What to do if you need assistance after hours or on the weekends: CALL [phone].  HOLD on the line, do not hang up.  You will hear multiple messages but at the end you will be connected with a nurse triage line.  They will contact the doctor if necessary.  Most of the time they will be able to assist you.  Do not call the hospital operator.      I have been informed and understand all of the instructions given to me and have received a copy. I have been instructed to call the clinic [phone] or my family physician as soon as possible for continued medical care, if indicated. I do not have  any more questions at this time but understand that I may call the [facility] or the Patient Navigator at [phone] during office hours should I have questions or need assistance in obtaining follow-up care.

## 2018-08-20 NOTE — Transfer of Care (Signed)
Immediate Anesthesia Transfer of Care Note  Patient: Michelle Holt  Procedure(s) Performed: REMOVAL OF PLEURAL DRAINAGE CATHETER (Left Chest)  Patient Location: PACU  Anesthesia Type:General  Level of Consciousness: awake and alert   Airway & Oxygen Therapy: Patient Spontanous Breathing  Post-op Assessment: Report given to RN and Post -op Vital signs reviewed and stable  Post vital signs: Reviewed and stable  Last Vitals:  Vitals Value Taken Time  BP 107/53 08/20/18 1524  Temp    Pulse 86 08/20/18 1527  Resp 14 08/20/18 1527  SpO2 94 % 08/20/18 1527  Vitals shown include unvalidated device data.  Last Pain:  Vitals:   08/20/18 1425  PainSc: 0-No pain      Patients Stated Pain Goal: 3 (61/95/09 [id number])  Complications: No apparent anesthesia complications

## 2018-08-20 NOTE — Procedures (Signed)
Interventional Radiology Procedure Note  Procedure: Placement of a right IJ approach single lumen PowerPort.  Tip is positioned at the superior cavoatrial junction and catheter is ready for immediate use.  Complications: None Recommendations:  - Ok to shower tomorrow - Do not submerge for 7 days - Routine line care   Signed,  Cinzia Devos S. Loden Laurent, DO   

## 2018-08-20 NOTE — Discharge Instructions (Signed)
No driving until tomorrow Leave dry dressing in place for 24 hours then you may shower Office will call for follow-up appointment for suture removal

## 2018-08-21 ENCOUNTER — Ambulatory Visit (HOSPITAL_COMMUNITY)
Admission: RE | Admit: 2018-08-21 | Discharge: 2018-08-21 | Disposition: A | Discharge status: Home / Self Care | Payer: Medicaid Other | Source: Ambulatory Visit | Attending: Gynecologic Oncology | Admitting: Gynecologic Oncology

## 2018-08-21 ENCOUNTER — Inpatient Hospital Stay (HOSPITAL_COMMUNITY): Payer: Medicaid Other

## 2018-08-21 ENCOUNTER — Other Ambulatory Visit: Payer: Self-pay

## 2018-08-21 ENCOUNTER — Encounter (HOSPITAL_COMMUNITY): Payer: Self-pay | Admitting: Cardiothoracic Surgery

## 2018-08-21 DIAGNOSIS — C562 Malignant neoplasm of left ovary: Secondary | ICD-10-CM | POA: Diagnosis present

## 2018-08-21 DIAGNOSIS — Z95828 Presence of other vascular implants and grafts: Secondary | ICD-10-CM

## 2018-08-21 DIAGNOSIS — C563 Malignant neoplasm of bilateral ovaries: Secondary | ICD-10-CM

## 2018-08-21 DIAGNOSIS — C782 Secondary malignant neoplasm of pleura: Secondary | ICD-10-CM | POA: Insufficient documentation

## 2018-08-21 DIAGNOSIS — C561 Malignant neoplasm of right ovary: Secondary | ICD-10-CM | POA: Diagnosis present

## 2018-08-21 MED ORDER — IOHEXOL 300 MG/ML  SOLN
30.0000 mL | Freq: Once | INTRAMUSCULAR | Status: AC | PRN
  Administered 2018-08-21: 30 mL via ORAL

## 2018-08-21 MED ORDER — IOHEXOL 300 MG/ML  SOLN
100.0000 mL | Freq: Once | INTRAMUSCULAR | Status: AC | PRN
  Administered 2018-08-21: 100 mL via INTRAVENOUS

## 2018-08-21 NOTE — Progress Notes (Signed)
Nutrition Assessment   Reason for Assessment:   Starting chemotherapy for stage IV ovarian cancer   ASSESSMENT:  48 year old female with stage IV ovarian cancer with left pleural effusion.  Past medical history of IBS.  Planning to start neoadjuvant chemotherapy.    Met with patient and significant other today in clinic.  Patient reports that she is hungry especially today as unable to eat until after CT scan this afternoon.  Patient reports that she is hungry generally but is only able to eat small volume at a time.  Reports that she drinks a smoothie (fruit chia seeds or hemp, spinach and green tea or water) for breakfast or eats yogurt.  Lunch is banana sandwich or tuna and supper is meat and vegetables.  Does drink a [employer] shake with 25 g of protein (not daily).  Reports that she has a bowel movement daily with the help of stool softners, miralax.      Nutrition Focused Physical Exam: deferred   Medications: colace, miralax, compazine   Labs: reviewed   Anthropometrics:   Height: 66 inches Weight: 159 lb 13.3 oz 8/6 UBW: stable weight BMI: 25  Stable weight   Estimated Energy Needs  Kcals: 2100-2500 calories Protein: 105-125 g Fluid: 2.5 L   NUTRITION DIAGNOSIS: Inadequate oral intake related to cancer as evidenced by pt reporting eating few bites then full.    INTERVENTION:  Discussed importance of good nutrition during treatment.  Encouraged small frequent meals. Discussed importance of having regular bowel movement Made suggestions of protein sources to add to smoothie as lacking protein currently.   Contact information provided  MONITORING, EVALUATION, GOAL: Patient will consume adequate calories and protein to maintain lean muscle mass during treamtent   Next Visit: phone f/u August 28  Anshi Jalloh B. Zenia Resides, [place], [place] Registered Dietitian [phone] (pager)

## 2018-08-21 NOTE — Progress Notes (Signed)
Chemotherapy education packet given and discussed with pt and family in detail.  Discussed diagnosis and staging, and tx regimen.  Reviewed chemotherapy medications and side effects, as well as pre-medications.  Instructed on how to manage side effects at home, and when to call the clinic.  Importance of fever/chills discussed with pt and family. Discussed precautions to implement at home after receiving tx, as well as self care strategies. Phone numbers provided for clinic during regular working hours, also how to reach the clinic after hours and on weekends. Pt and family provided the opportunity to ask questions - all questions answered to pt's and family satisfaction.  

## 2018-08-24 ENCOUNTER — Encounter (HOSPITAL_COMMUNITY): Payer: Self-pay | Admitting: Hematology

## 2018-08-24 ENCOUNTER — Other Ambulatory Visit: Payer: Self-pay

## 2018-08-24 ENCOUNTER — Encounter (HOSPITAL_COMMUNITY): Payer: Self-pay

## 2018-08-24 ENCOUNTER — Inpatient Hospital Stay (HOSPITAL_COMMUNITY): Payer: Medicaid Other

## 2018-08-24 ENCOUNTER — Inpatient Hospital Stay (HOSPITAL_BASED_OUTPATIENT_CLINIC_OR_DEPARTMENT_OTHER): Payer: Medicaid Other | Admitting: Hematology

## 2018-08-24 VITALS — BP 99/55 | HR 84 | Temp 97.3°F | Resp 18

## 2018-08-24 DIAGNOSIS — C563 Malignant neoplasm of bilateral ovaries: Secondary | ICD-10-CM

## 2018-08-24 DIAGNOSIS — C561 Malignant neoplasm of right ovary: Secondary | ICD-10-CM

## 2018-08-24 DIAGNOSIS — C562 Malignant neoplasm of left ovary: Secondary | ICD-10-CM | POA: Diagnosis not present

## 2018-08-24 DIAGNOSIS — Z95828 Presence of other vascular implants and grafts: Secondary | ICD-10-CM

## 2018-08-24 DIAGNOSIS — Z5111 Encounter for antineoplastic chemotherapy: Secondary | ICD-10-CM | POA: Diagnosis not present

## 2018-08-24 LAB — COMPREHENSIVE METABOLIC PANEL
ALT: 23 U/L (ref 0–44)
AST: 33 U/L (ref 15–41)
Albumin: 3 g/dL — ABNORMAL LOW (ref 3.5–5.0)
Alkaline Phosphatase: 139 U/L — ABNORMAL HIGH (ref 38–126)
Anion gap: 11 (ref 5–15)
BUN: 8 mg/dL (ref 6–20)
CO2: 25 mmol/L (ref 22–32)
Calcium: 9.1 mg/dL (ref 8.9–10.3)
Chloride: 102 mmol/L (ref 98–111)
Creatinine, Ser: 0.69 mg/dL (ref 0.44–1.00)
GFR calc Af Amer: >60 mL/min (ref >60)
GFR calc non Af Amer: >60 mL/min (ref >60)
Glucose, Bld: 150 mg/dL — ABNORMAL HIGH (ref 70–99)
Potassium: 3.5 mmol/L (ref 3.5–5.1)
Sodium: 138 mmol/L (ref 135–145)
Total Bilirubin: 0.4 mg/dL (ref 0.3–1.2)
Total Protein: 6.4 g/dL — ABNORMAL LOW (ref 6.5–8.1)

## 2018-08-24 LAB — CBC WITH DIFFERENTIAL/PLATELET
Abs Immature Granulocytes: 0.07 10*3/uL (ref 0.00–0.07)
Basophils Absolute: 0.1 10*3/uL (ref 0.0–0.1)
Basophils Relative: 1 %
Eosinophils Absolute: 0.7 10*3/uL — ABNORMAL HIGH (ref 0.0–0.5)
Eosinophils Relative: 7 %
HCT: 42.7 % (ref 36.0–46.0)
Hemoglobin: 13.1 g/dL (ref 12.0–15.0)
Immature Granulocytes: 1 %
Lymphocytes Relative: 23 %
Lymphs Abs: 2.2 10*3/uL (ref 0.7–4.0)
MCH: 29.4 pg (ref 26.0–34.0)
MCHC: 30.7 g/dL (ref 30.0–36.0)
MCV: 95.7 fL (ref 80.0–100.0)
Monocytes Absolute: 1 10*3/uL (ref 0.1–1.0)
Monocytes Relative: 11 %
Neutro Abs: 5.3 10*3/uL (ref 1.7–7.7)
Neutrophils Relative %: 57 %
Platelets: 457 10*3/uL — ABNORMAL HIGH (ref 150–400)
RBC: 4.46 MIL/uL (ref 3.87–5.11)
RDW: 15.3 % (ref 11.5–15.5)
WBC: 9.3 10*3/uL (ref 4.0–10.5)
nRBC: 0 % (ref 0.0–0.2)

## 2018-08-24 MED ORDER — SODIUM CHLORIDE 0.9 % IV SOLN
Freq: Once | INTRAVENOUS | Status: AC
  Administered 2018-08-24: 10:00:00 via INTRAVENOUS
  Filled 2018-08-24: qty 5

## 2018-08-24 MED ORDER — PALONOSETRON HCL INJECTION 0.25 MG/5ML
INTRAVENOUS | Status: AC
  Filled 2018-08-24: qty 5

## 2018-08-24 MED ORDER — ACETAMINOPHEN 325 MG PO TABS
ORAL_TABLET | ORAL | Status: AC
  Filled 2018-08-24: qty 2

## 2018-08-24 MED ORDER — FAMOTIDINE IN NACL 20-0.9 MG/50ML-% IV SOLN
INTRAVENOUS | Status: AC
  Filled 2018-08-24: qty 50

## 2018-08-24 MED ORDER — HEPARIN SOD (PORK) LOCK FLUSH 100 UNIT/ML IV SOLN
500.0000 [IU] | Freq: Once | INTRAVENOUS | Status: AC | PRN
  Administered 2018-08-24: 500 [IU]

## 2018-08-24 MED ORDER — FAMOTIDINE IN NACL 20-0.9 MG/50ML-% IV SOLN
20.0000 mg | Freq: Once | INTRAVENOUS | Status: AC
  Administered 2018-08-24: 20 mg via INTRAVENOUS

## 2018-08-24 MED ORDER — SODIUM CHLORIDE 0.9 % IV SOLN
742.8000 mg | Freq: Once | INTRAVENOUS | Status: AC
  Administered 2018-08-24: 740 mg via INTRAVENOUS
  Filled 2018-08-24: qty 74

## 2018-08-24 MED ORDER — PROMETHAZINE HCL 25 MG/ML IJ SOLN
25.0000 mg | Freq: Once | INTRAMUSCULAR | Status: AC
  Administered 2018-08-24: 25 mg via INTRAVENOUS

## 2018-08-24 MED ORDER — PALONOSETRON HCL INJECTION 0.25 MG/5ML
0.2500 mg | Freq: Once | INTRAVENOUS | Status: AC
  Administered 2018-08-24: 0.25 mg via INTRAVENOUS

## 2018-08-24 MED ORDER — PROMETHAZINE HCL 25 MG/ML IJ SOLN
INTRAMUSCULAR | Status: AC
  Filled 2018-08-24: qty 1

## 2018-08-24 MED ORDER — SODIUM CHLORIDE 0.9 % IV SOLN
175.0000 mg/m2 | Freq: Once | INTRAVENOUS | Status: AC
  Administered 2018-08-24: 324 mg via INTRAVENOUS
  Filled 2018-08-24: qty 54

## 2018-08-24 MED ORDER — SODIUM CHLORIDE 0.9 % IV SOLN
Freq: Once | INTRAVENOUS | Status: AC
  Administered 2018-08-24: 09:00:00 via INTRAVENOUS

## 2018-08-24 MED ORDER — DIPHENHYDRAMINE HCL 50 MG/ML IJ SOLN
50.0000 mg | Freq: Once | INTRAMUSCULAR | Status: AC
  Administered 2018-08-24: 50 mg via INTRAVENOUS

## 2018-08-24 MED ORDER — SODIUM CHLORIDE 0.9% FLUSH
10.0000 mL | INTRAVENOUS | Status: DC | PRN
  Administered 2018-08-24: 10 mL
  Filled 2018-08-24: qty 10

## 2018-08-24 MED ORDER — DIPHENHYDRAMINE HCL 50 MG/ML IJ SOLN
INTRAMUSCULAR | Status: AC
  Filled 2018-08-24: qty 1

## 2018-08-24 NOTE — Patient Instructions (Signed)
[facility] Discharge Instructions for Patients Receiving Chemotherapy  Today you received the following chemotherapy agents   To help prevent nausea and vomiting after your treatment, we encourage you to take your nausea medication   If you develop nausea and vomiting that is not controlled by your nausea medication, call the clinic.   BELOW ARE SYMPTOMS THAT SHOULD BE REPORTED IMMEDIATELY:  *FEVER GREATER THAN 100.5 F  *CHILLS WITH OR WITHOUT FEVER  NAUSEA AND VOMITING THAT IS NOT CONTROLLED WITH YOUR NAUSEA MEDICATION  *UNUSUAL SHORTNESS OF BREATH  *UNUSUAL BRUISING OR BLEEDING  TENDERNESS IN MOUTH AND THROAT WITH OR WITHOUT PRESENCE OF ULCERS  *URINARY PROBLEMS  *BOWEL PROBLEMS  UNUSUAL RASH Items with * indicate a potential emergency and should be followed up as soon as possible.  Feel free to call the clinic should you have any questions or concerns. The clinic phone number is [phone].  Please show the CHEMO ALERT CARD at check-in to the Emergency Department and triage nurse.   

## 2018-08-24 NOTE — Progress Notes (Signed)
Labs reviewed with MD today at office visit. Proceed with day one of treatment today.    1515-patient complaining of some nausea, MD aware. Will give phenergan per orders. Patient states that nausea is better.   Treatment given per orders. Patient tolerated it well without problems. Vitals stable and discharged home from clinic via wheelchair. Follow up as scheduled.

## 2018-08-24 NOTE — Assessment & Plan Note (Signed)
1.  Clinical stage IVa ovarian cancer, positive cytology of left pleural effusion: - Cytology of the left pleural fluid consistent with adenocarcinoma, likely gynecological primary. -PET scan on 07/31/2018 shows hypermetabolic tissue associated with cystic masses of the left and right ovary consistent with ovarian carcinoma.  Uptake in the left pleural space present. -She met with Dr. Denman George who recommended neoadjuvant chemotherapy. - CT CAP on 08/21/2018 shows bilateral solid cystic ovarian masses.  Scattered peritoneal and omental disease present.  Small left pleural effusion with stable talc pleurodesis on the left side.  Stable large intrasplenic hematoma. - We reviewed her blood work. - We talked extensively about chemotherapy regimen containing carboplatin and paclitaxel.  We talked the side effects in detail. -Plan is to do 3 cycles of chemotherapy followed by restaging CT scans.  We will also monitor Ca1 25 levels. - She may proceed with cycle 1 today.  We will see her back in 1 week for follow-up. -We will also follow-up on her germline mutation testing.  2.  Left chest wall pain: - She had left Pleurx catheter discontinued on 08/20/2018. -She is taking Dilaudid 4 mg every 4-5 hours which is helping with the pain.  3.  Splenic hematoma: - Her hemoglobin has been staying stable since she underwent coil embolization of splenic artery on 08/01/2018. - CT CAP on 08/21/2018 showed stable hematoma.

## 2018-08-24 NOTE — Progress Notes (Signed)
Michelle Holt, [address] [id number]   CLINIC:  Medical Oncology/Hematology  PCP:  [name], MD [place] [address] [phone]   REASON FOR VISIT:  Follow-up for ovarian cancer, splenic hematoma.   BRIEF ONCOLOGIC HISTORY:  Oncology History  Ovarian cancer, bilateral ([place])  07/07/2018 Pathology Results   PLEURAL FLUID, LEFT (SPECIMEN 1 OF 1, COLLECTED 07/07/18): - MALIGNANT CELLS CONSISTENT WITH ADENOCARCINOMA - SEE COMMENT  Source Pleural Fluid, (Specimen 1 of 1, collected on 07/07/2018) Gross Specimen: Received is/are 1000cc of bloody red fluid with tissue. (TC:tc) Prepared: # Smears: 0 # Concentration Technique Slides (i.e. ThinPrep): 1 # Cell Block: 1 Conventional Additional Studies: Two Hematology slides labeled [id number] Comment The malignant cells are positive for cytokeratin 7, p53, WT-1, Pax-8, Moc31, ER (weak) and EMA but negative for cytokeratin 20, TTF-1, GATA-3, CDX-2 and D2-40. Overall, the phenotype is consistent with a gynecologic primary; clinical correlation recommended.   07/07/2018 Procedure   Successful ultrasound guided left thoracentesis yielding 2.0 L of pleural fluid   07/08/2018 Procedure   1. Technically successful placement of left 14 French pigtail chest drain, placed to Pleur-evac water-seal.   07/08/2018 Procedure   1. Technically successful five [place] double lumen power injectable PICC placement   07/09/2018 Imaging   Ct chest 1. There is a moderate, loculated left hydropneumothorax with a small air component and moderate fluid component. The largest loculated component is located posteriorly. There is a pigtail drainage catheter about the lateral pleural space. There is no obvious etiology, such as obvious mass or pleural disease.   2. There is a small right pleural effusion with associated atelectasis or consolidation and a subpleural consolidation of the superior segment right lower lobe (series  4, image 56), of uncertain significance, possibly infectious or inflammatory   07/10/2018 Imaging   Ct abdomen and pelvis: 1. The bilateral ovaries are enlarged by heterogeneous appearing cystic lesions, measuring 5.3 x 4.2 cm on the right (series 4, image 72) and 4.5 x 3.2 cm on the left (series 4, image 75). Consider dedicated pelvic ultrasound and/or pelvic MRI to further evaluate for solid components given high suspicion for GYN primary malignancy.   2. No other evidence of mass and no lymphadenopathy in the abdomen or pelvis.   3. Trace ascites. There is some suggestion of omental and peritoneal nodularity (e.g. Series 4, image 40), concerning for peritoneal metastatic disease.    4. Loculated left-sided pleural effusion with left-sided pleural drainage catheter in position. Small right pleural effusion   07/13/2018 Surgery   OPERATION: 1.  Left VATS (video-assisted thoracoscopic surgery) for drainage of loculated pleural effusion. 2.  Talc pleurodesis for malignant pleural effusion. 3.  Placement of PleurX catheter for management of malignant pleural effusion. 4.  Placement of On-Q analgesia catheter system.    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   07/13/2018 Pathology Results   Pleura, peel, Left Pleural - FIBRO-FIBRINOUS PLEURITIS - NEGATIVE FOR MALIGNANCY   07/18/2018 Initial Diagnosis   Ovarian cancer, bilateral ([place])   07/20/2018 Procedure   EGD impression: Normal proximal esophagus and mid esophagus. Mild distal esophageal rings; dilation not performed because of esophagitis. LA Grade C reflux esophagitis. Z-line regular, 30 cm from the incisors. 5 cm hiatal hernia. Non-bleeding gastric ulcer with no stigmata of bleeding. Gastritis.  Duodenal erosions without bleeding. Normal  second portion of the duodenum. No specimens collected.   08/19/2018 Genetic Testing   Negative genetic testing on the common hereditary cancer panel.  The Common Hereditary Gene Panel offered by [facility] includes sequencing and/or deletion duplication testing of the following 48 genes: APC, ATM, AXIN2, BARD1, BMPR1A, BRCA1, BRCA2, BRIP1, CDH1, CDK4, CDKN2A (p14ARF), CDKN2A (p16INK4a), CHEK2, CTNNA1, DICER1, EPCAM (Deletion/duplication testing only), GREM1 (promoter region deletion/duplication testing only), KIT, MEN1, MLH1, MSH2, MSH3, MSH6, MUTYH, NBN, NF1, NHTL1, PALB2, PDGFRA, PMS2, POLD1, POLE, PTEN, RAD50, RAD51C, RAD51D, RNF43, SDHB, SDHC, SDHD, SMAD4, SMARCA4. STK11, TP53, TSC1, TSC2, and VHL.  The following genes were evaluated for sequence changes only: SDHA and HOXB13 c.251G>A variant only. The report date is August 19, 2018.   08/24/2018 -  Chemotherapy   The patient had palonosetron (ALOXI) injection 0.25 mg, 0.25 mg, Intravenous,  Once, 1 of 6 cycles CARBOplatin (PARAPLATIN) 740 mg in sodium chloride 0.9 % 250 mL chemo infusion, 740 mg (100 % of original dose 742.8 mg), Intravenous,  Once, 1 of 6 cycles Dose modification:   (original dose 742.8 mg, Cycle 1) PACLitaxel (TAXOL) 324 mg in sodium chloride 0.9 % 500 mL chemo infusion (> '80mg'$ /m2), 175 mg/m2 = 324 mg, Intravenous,  Once, 1 of 6 cycles fosaprepitant (EMEND) 150 mg, dexamethasone (DECADRON) 12 mg in sodium chloride 0.9 % 145 mL IVPB, , Intravenous,  Once, 1 of 6 cycles  for chemotherapy treatment.       CANCER STAGING: Cancer Staging No matching staging information was found for the patient.   INTERVAL HISTORY:  Michelle Holt 48 y.o. female seen for follow-up of her advanced ovarian cancer.  She is continuing to take Dilaudid 4 mg every 4-5 hours as needed for left chest wall pain.  She had left Pleurx catheter removed on 08/20/2018.  She also had a port placement by interventional radiology.  Appetite levels are 75%.  Energy  levels are also 75%.  Pain is noted as 2 out of 10 on the left chest wall.  Denies any fevers or chills.  Denies any nausea, vomiting or diarrhea.  No bleeding per rectum or melena.  She had experienced bilateral shoulder pains on Friday night which have improved.  She is accompanied by her daughter today and is eager to proceed with her first treatment.   REVIEW OF SYSTEMS:  Review of Systems  Cardiovascular: Positive for chest pain.  All other systems reviewed and are negative.    PAST MEDICAL/SURGICAL HISTORY:  Past Medical History:  Diagnosis Date  . Anxiety and depression   . Arthritis of facet joints at multiple vertebral levels    L5-S1  . Constipation   . Dyslipidemia   . Family history of breast cancer   . Family history of uterine cancer   . Insomnia   . Irritable bowel syndrome   . Migraine   . Muscle tension headache   . Ovarian carcinoma ([place])   . Plantar fasciitis of right foot   . Port-A-Cath in place 08/20/2018   Past Surgical History:  Procedure Laterality Date  . CHOLECYSTECTOMY  2008  . COLONOSCOPY N/A 08/13/2013   Procedure: COLONOSCOPY;  Surgeon: Rogene Houston, MD;  [facility];  Service: Endoscopy;  Laterality: N/A;  230-moved to 145 Ann to notify pt  . ESOPHAGOGASTRODUODENOSCOPY    . ESOPHAGOGASTRODUODENOSCOPY (EGD) WITH PROPOFOL N/A 07/20/2018   Procedure: ESOPHAGOGASTRODUODENOSCOPY (EGD) WITH PROPOFOL;  Surgeon: Rogene Houston, MD;  [facility];  Service: Endoscopy;  Laterality: N/A;  Possible esophageal dilation.  . IR ANGIOGRAM SELECTIVE EACH ADDITIONAL VESSEL  08/01/2018  . IR ANGIOGRAM VISCERAL SELECTIVE  08/01/2018  . IR EMBO ART  VEN HEMORR LYMPH EXTRAV  INC GUIDE ROADMAPPING  08/01/2018  . IR IMAGING GUIDED PORT INSERTION  08/20/2018  . IR PERC PLEURAL DRAIN W/INDWELL CATH W/IMG GUIDE  07/08/2018  . IR THORACENTESIS ASP PLEURAL SPACE W/IMG GUIDE  07/07/2018  . IR US GUIDE VASC ACCESS RIGHT  08/01/2018  . PLEURAL EFFUSION  DRAINAGE Left 07/13/2018   Procedure: DRAINAGE OF LOCULATED PLEURAL EFFUSION;  Surgeon: Ivin Poot, MD;  Location: [facility];  Service: Thoracic;  Laterality: Left;  . REMOVAL OF PLEURAL DRAINAGE CATHETER Left 08/20/2018   Procedure: REMOVAL OF PLEURAL DRAINAGE CATHETER;  Surgeon: Ivin Poot, MD;  Location: [facility];  Service: Thoracic;  Laterality: Left;  . REMOVAL OF PLEURAL DRAINAGE CATHETER Left 08/20/2018   Procedure: REMOVAL OF PLEURAL DRAINAGE CATHETER;  Surgeon: Ivin Poot, MD;  Location: [facility];  Service: Thoracic;  Laterality: Left;  . TALC PLEURODESIS Left 07/13/2018   Procedure: Talc Pleuradesis;  Surgeon: Prescott Gum, Collier Salina, MD;  Location: [facility];  Service: Thoracic;  Laterality: Left;  . TUBAL LIGATION Bilateral   . UTERINE ABLATION    . VIDEO ASSISTED THORACOSCOPY Left 07/13/2018   Procedure: VIDEO ASSISTED THORACOSCOPY;  Surgeon: Ivin Poot, MD;  Location: [facility] OR;  Service: Thoracic;  Laterality: Left;     SOCIAL HISTORY:  Social History   Socioeconomic History  . Marital status: Widowed    Spouse name: Not on file  . Number of children: 2  . Years of education: 2-College  . Highest education level: Not on file  Occupational History    Employer: [employer]  . Financial resource strain: Not hard at all  . Food insecurity    Worry: Never true    Inability: Never true  . Transportation needs    Medical: No    Non-medical: No  Tobacco Use  . Smoking status: Former Smoker    Packs/day: 0.50    Years: 17.00    Pack years: 8.50    Types: Cigarettes    Quit date: 06/22/2018    Years since quitting: 0.1  . Smokeless tobacco: Never Used  Substance and Sexual Activity  . Alcohol use: Yes    Alcohol/week: 1.0 standard drinks    Types: 1 Glasses of wine per week    Comment: Drinks alcohol on occasion  . Drug use: No  . Sexual activity: Not on file  Lifestyle  . Physical activity    Days per week: 0 days    Minutes per session: 0 min  . Stress:  Very much  Relationships  . Social connections    Talks on phone: More than three times a week    Gets together: More than three times a week    Attends religious service: 1 to 4 times per year    Active member of club or organization: No    Attends meetings of clubs or organizations: Never    Relationship status: Widowed  . Intimate partner violence    Fear of current or ex partner: No    Emotionally abused: No    Physically abused: No    Forced sexual activity: No  Other Topics Concern  . Not on file  Social History Narrative   Patient lives at home with her daughter.    Patient has 2  children.    Patient is widowed.    Patient is right handed.    Patient has her Associates degree.       FAMILY HISTORY:  Family History  Problem Relation Age of Onset  . Hypertension Mother   . Obesity Mother   . Diabetes Mother   . Kidney disease Mother   . Peripheral vascular disease Father   . Atrial fibrillation Father   . COPD Brother   . Osteoporosis Brother   . Crohn's disease Sister   . Uterine cancer Sister 40       maternal half sister  . Breast cancer Maternal Aunt 46  . Colon cancer Neg Hx     CURRENT MEDICATIONS:  Outpatient Encounter Medications as of 08/24/2018  Medication Sig  . benzonatate (TESSALON PERLES) 100 MG capsule Take 1 capsule (100 mg total) by mouth 3 (three) times daily as needed for cough. (Patient not taking: Reported on 08/19/2018)  . CARBOPLATIN IV Inject into the vein every 21 ( twenty-one) days.  . diazepam (VALIUM) 5 MG tablet TAKE ONE TABLET BY MOUTH AT BEDTIME AS NEEDED FOR ANXIETY  . docusate sodium (COLACE) 100 MG capsule Take 1 capsule (100 mg total) by mouth 2 (two) times daily.  Marland Kitchen gabapentin (NEURONTIN) 300 MG capsule Take 1 capsule (300 mg total) by mouth 3 (three) times daily.  Marland Kitchen HYDROmorphone (DILAUDID) 4 MG tablet Take 1 tablet (4 mg total) by mouth every 4 (four) hours as needed for severe pain.  Marland Kitchen lidocaine-prilocaine (EMLA) cream Apply  small amount to port a cath site and cover with plastic wrap one hour prior to chemotherapy appointments  . Oxycodone HCl 10 MG TABS Take 1 tablet (10 mg total) by mouth every 4 (four) hours as needed. (Patient not taking: Reported on 08/19/2018)  . PACLITAXEL IV Inject into the vein every 21 ( twenty-one) days.  . pantoprazole (PROTONIX) 40 MG tablet Take 1 tablet (40 mg total) by mouth 2 (two) times daily before a meal.  . polyethylene glycol (MIRALAX / GLYCOLAX) 17 g packet Take 17 g by mouth as needed.  . prochlorperazine (COMPAZINE) 10 MG tablet Take 1 tablet (10 mg total) by mouth every 6 (six) hours as needed (Nausea or vomiting).  . promethazine (PHENERGAN) 25 MG tablet Take 1 tablet (25 mg total) by mouth every 6 (six) hours as needed for nausea, vomiting or refractory nausea / vomiting.  . SUMAtriptan (IMITREX) 100 MG tablet TAKE ONE TABLET BY MOUTH PRN UP to TWICE DAILY AS NEEDED.   No facility-administered encounter medications on file as of 08/24/2018.     ALLERGIES:  Allergies  Allergen Reactions  . Morphine And Related Itching  . Nortriptyline Other (See Comments)    Significant weight gain  . Topamax [Topiramate] Diarrhea    nausea  . Actifed Cold-Allergy [Chlorpheniramine-Phenyleph Er] Rash  . Amoxicillin Rash    Did it involve swelling of the face/tongue/throat, SOB, or low BP? Unknown Did it involve sudden or severe rash/hives, skin peeling, or any reaction on the inside of your mouth or nose? Unknown Did you need to seek medical attention at a hospital or doctor's office? Unknown When did it last happen? teenager If all above answers are "NO", may proceed with cephalosporin use.   . Codeine Hives  . Erythromycin Rash  . Penicillins Rash    Did it involve swelling of the face/tongue/throat, SOB, or low BP? Unknown Did it involve sudden or severe rash/hives, skin peeling, or any reaction on  the inside of your mouth or nose? Unknown Did you need to seek medical  attention at a hospital or doctor's office? Unknown When did it last happen? teenager If all above answers are "NO", may proceed with cephalosporin use.   [name] [Pseudoephedrine Hcl] Rash     PHYSICAL EXAM:  ECOG Performance status: 1  Vitals:   08/24/18 0811 08/24/18 0814  BP:  106/67  Pulse:  (!) 103  Resp: 18 18  Temp: 97.9 F (36.6 C) 97.9 F (36.6 C)  SpO2:  97%   Filed Weights   08/24/18 0811 08/24/18 0814  Weight: 160 lb 8 oz (72.8 kg) 160 lb 8 oz (72.8 kg)    Physical Exam Vitals signs reviewed.  Constitutional:      Appearance: Normal appearance.  Cardiovascular:     Rate and Rhythm: Normal rate and regular rhythm.     Heart sounds: Normal heart sounds.  Pulmonary:     Effort: Pulmonary effort is normal.     Breath sounds: Normal breath sounds.     Comments: Decreased breath sounds at left lung base.  Left Pleurx catheter present. Abdominal:     General: There is no distension.     Palpations: Abdomen is soft. There is no mass.  Skin:    General: Skin is warm.  Neurological:     General: No focal deficit present.     Mental Status: She is alert and oriented to person, place, and time.  Psychiatric:        Mood and Affect: Mood normal.        Behavior: Behavior normal.      LABORATORY DATA:  I have reviewed the labs as listed.  CBC    Component Value Date/Time   WBC 9.3 08/24/2018 0823   RBC 4.46 08/24/2018 0823   HGB 13.1 08/24/2018 0823   HCT 42.7 08/24/2018 0823   PLT 457 (H) 08/24/2018 0823   MCV 95.7 08/24/2018 0823   MCH 29.4 08/24/2018 0823   MCHC 30.7 08/24/2018 0823   RDW 15.3 08/24/2018 0823   LYMPHSABS 2.2 08/24/2018 0823   MONOABS 1.0 08/24/2018 0823   EOSABS 0.7 (H) 08/24/2018 0823   BASOSABS 0.1 08/24/2018 0823   CMP Latest Ref Rng & Units 08/24/2018 08/20/2018 08/14/2018  Glucose 70 - 99 mg/dL 150(H) 94 107(H)  BUN 6 - 20 mg/dL '8 7 8  '$ Creatinine 0.44 - 1.00 mg/dL 0.69 0.66 0.70  Sodium 135 - 145 mmol/L 138 137 139   Potassium 3.5 - 5.1 mmol/L 3.5 4.1 4.2  Chloride 98 - 111 mmol/L 102 101 102  CO2 22 - 32 mmol/L '25 25 28  '$ Calcium 8.9 - 10.3 mg/dL 9.1 9.4 9.4  Total Protein 6.5 - 8.1 g/dL 6.4(L) - 7.0  Total Bilirubin 0.3 - 1.2 mg/dL 0.4 - 0.6  Alkaline Phos 38 - 126 U/L 139(H) - 214(H)  AST 15 - 41 U/L 33 - 36  ALT 0 - 44 U/L 23 - 27       DIAGNOSTIC IMAGING:  I have independently reviewed the scans and discussed with the patient.   I have reviewed Venita Lick LPN's note and agree with the documentation.  I personally performed a face-to-face visit, made revisions and my assessment and plan is as follows.    ASSESSMENT & PLAN:   Ovarian cancer, bilateral ([place]) 1.  Clinical stage IVa ovarian cancer, positive cytology of left pleural effusion: - Cytology of the left pleural fluid consistent with adenocarcinoma, likely gynecological  primary. -PET scan on 07/31/2018 shows hypermetabolic tissue associated with cystic masses of the left and right ovary consistent with ovarian carcinoma.  Uptake in the left pleural space present. -She met with Dr. Denman George who recommended neoadjuvant chemotherapy. - CT CAP on 08/21/2018 shows bilateral solid cystic ovarian masses.  Scattered peritoneal and omental disease present.  Small left pleural effusion with stable talc pleurodesis on the left side.  Stable large intrasplenic hematoma. - We reviewed her blood work. - We talked extensively about chemotherapy regimen containing carboplatin and paclitaxel.  We talked the side effects in detail. -Plan is to do 3 cycles of chemotherapy followed by restaging CT scans.  We will also monitor Ca1 25 levels. - She may proceed with cycle 1 today.  We will see her back in 1 week for follow-up. -We will also follow-up on her germline mutation testing.  2.  Left chest wall pain: - She had left Pleurx catheter discontinued on 08/20/2018. -She is taking Dilaudid 4 mg every 4-5 hours which is helping with the pain.  3.  Splenic  hematoma: - Her hemoglobin has been staying stable since she underwent coil embolization of splenic artery on 08/01/2018. - CT CAP on 08/21/2018 showed stable hematoma.   Total time spent is 40 minutes with more than 50% of the time spent face-to-face discussing treatment plan, counseling and coordination of care.  Orders placed this encounter:  No orders of the defined types were placed in this encounter.     Derek Jack, MD [place] [phone]  \

## 2018-08-24 NOTE — Patient Instructions (Addendum)
[facility] at [facility] Discharge Instructions  You were seen today by Dr. Katragadda. He went over your recent lab results. He will see you back in 1 week for labs and follow up.   Thank you for choosing [facility] at [facility] to provide your oncology and hematology care.  To afford each patient quality time with our provider, please arrive at least 15 minutes before your scheduled appointment time.   If you have a lab appointment with the Cancer Center please come in thru the  Main Entrance and check in at the main information desk  You need to re-schedule your appointment should you arrive 10 or more minutes late.  We strive to give you quality time with our providers, and arriving late affects you and other patients whose appointments are after yours.  Also, if you no show three or more times for appointments you may be dismissed from the clinic at the providers discretion.     Again, thank you for choosing [facility].  Our hope is that these requests will decrease the amount of time that you wait before being seen by our physicians.       _____________________________________________________________  Should you have questions after your visit to  Cancer Center, please contact our office at [phone] between the hours of 8:00 a.m. and 4:30 p.m.  Voicemails left after 4:00 p.m. will not be returned until the following business day.  For prescription refill requests, have your pharmacy contact our office and allow 72 hours.    Cancer Center Support Programs:   > Cancer Support Group  2nd Tuesday of the month 1pm-2pm, Journey Room    

## 2018-08-25 ENCOUNTER — Ambulatory Visit: Payer: Self-pay | Admitting: General Surgery

## 2018-08-25 ENCOUNTER — Ambulatory Visit (HOSPITAL_COMMUNITY): Payer: Self-pay | Admitting: Hematology

## 2018-08-25 ENCOUNTER — Telehealth (HOSPITAL_COMMUNITY): Payer: Self-pay

## 2018-08-25 ENCOUNTER — Inpatient Hospital Stay (HOSPITAL_COMMUNITY): Payer: Medicaid Other | Admitting: General Practice

## 2018-08-25 ENCOUNTER — Encounter: Payer: Self-pay | Admitting: General Practice

## 2018-08-25 LAB — CA 125: Cancer Antigen (CA) 125: 1778 U/mL — ABNORMAL HIGH (ref 0.0–38.1)

## 2018-08-25 NOTE — Telephone Encounter (Signed)
24 hour call back- spoke with patient, states she is having some nausea but is taking her phenergan. She stated she is feeling pretty good today.
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
## 2018-08-26 NOTE — Anesthesia Postprocedure Evaluation (Signed)
Anesthesia Post Note  Patient: Michelle Holt  Procedure(s) Performed: REMOVAL OF PLEURAL DRAINAGE CATHETER (Left Chest)     Patient location during evaluation: PACU Anesthesia Type: General Level of consciousness: awake and alert Pain management: pain level controlled Vital Signs Assessment: post-procedure vital signs reviewed and stable Respiratory status: spontaneous breathing, nonlabored ventilation, respiratory function stable and patient connected to nasal cannula oxygen Cardiovascular status: blood pressure returned to baseline and stable Postop Assessment: no apparent nausea or vomiting Anesthetic complications: no    Last Vitals:  Vitals:   08/20/18 1620 08/20/18 1626  BP:  105/67  Pulse: 85 79  Resp: 16 15  Temp: 36.8 C   SpO2:  92%    Last Pain:  Vitals:   08/20/18 1626  PainSc: 5                  Elisabetta Mishra

## 2018-08-31 ENCOUNTER — Inpatient Hospital Stay (HOSPITAL_BASED_OUTPATIENT_CLINIC_OR_DEPARTMENT_OTHER): Payer: Medicaid Other | Admitting: Hematology

## 2018-08-31 ENCOUNTER — Inpatient Hospital Stay (HOSPITAL_COMMUNITY): Payer: Medicaid Other

## 2018-08-31 ENCOUNTER — Ambulatory Visit (INDEPENDENT_AMBULATORY_CARE_PROVIDER_SITE_OTHER): Payer: Self-pay

## 2018-08-31 ENCOUNTER — Encounter (HOSPITAL_COMMUNITY): Payer: Self-pay | Admitting: Hematology

## 2018-08-31 ENCOUNTER — Other Ambulatory Visit: Payer: Self-pay

## 2018-08-31 VITALS — BP 112/70 | HR 89 | Temp 97.7°F | Resp 18

## 2018-08-31 VITALS — BP 126/88 | HR 111 | Temp 97.5°F | Resp 18 | Wt 150.1 lb

## 2018-08-31 DIAGNOSIS — C561 Malignant neoplasm of right ovary: Secondary | ICD-10-CM

## 2018-08-31 DIAGNOSIS — Z4802 Encounter for removal of sutures: Secondary | ICD-10-CM

## 2018-08-31 DIAGNOSIS — C562 Malignant neoplasm of left ovary: Secondary | ICD-10-CM

## 2018-08-31 DIAGNOSIS — C563 Malignant neoplasm of bilateral ovaries: Secondary | ICD-10-CM

## 2018-08-31 DIAGNOSIS — K30 Functional dyspepsia: Secondary | ICD-10-CM

## 2018-08-31 DIAGNOSIS — Z5111 Encounter for antineoplastic chemotherapy: Secondary | ICD-10-CM | POA: Diagnosis not present

## 2018-08-31 LAB — CBC WITH DIFFERENTIAL/PLATELET
Abs Immature Granulocytes: 0.04 10*3/uL (ref 0.00–0.07)
Basophils Absolute: 0.1 10*3/uL (ref 0.0–0.1)
Basophils Relative: 1 %
Eosinophils Absolute: 0.3 10*3/uL (ref 0.0–0.5)
Eosinophils Relative: 5 %
HCT: 47.4 % — ABNORMAL HIGH (ref 36.0–46.0)
Hemoglobin: 15.5 g/dL — ABNORMAL HIGH (ref 12.0–15.0)
Immature Granulocytes: 1 %
Lymphocytes Relative: 35 %
Lymphs Abs: 1.8 10*3/uL (ref 0.7–4.0)
MCH: 29.8 pg (ref 26.0–34.0)
MCHC: 32.7 g/dL (ref 30.0–36.0)
MCV: 91.2 fL (ref 80.0–100.0)
Monocytes Absolute: 0.2 10*3/uL (ref 0.1–1.0)
Monocytes Relative: 4 %
Neutro Abs: 2.9 10*3/uL (ref 1.7–7.7)
Neutrophils Relative %: 54 %
Platelets: 320 10*3/uL (ref 150–400)
RBC: 5.2 MIL/uL — ABNORMAL HIGH (ref 3.87–5.11)
RDW: 14.8 % (ref 11.5–15.5)
WBC: 5.3 10*3/uL (ref 4.0–10.5)
nRBC: 0 % (ref 0.0–0.2)

## 2018-08-31 LAB — COMPREHENSIVE METABOLIC PANEL
ALT: 34 U/L (ref 0–44)
AST: 33 U/L (ref 15–41)
Albumin: 4.3 g/dL (ref 3.5–5.0)
Alkaline Phosphatase: 106 U/L (ref 38–126)
Anion gap: 12 (ref 5–15)
BUN: 14 mg/dL (ref 6–20)
CO2: 25 mmol/L (ref 22–32)
Calcium: 10 mg/dL (ref 8.9–10.3)
Chloride: 99 mmol/L (ref 98–111)
Creatinine, Ser: 0.66 mg/dL (ref 0.44–1.00)
GFR calc Af Amer: >60 mL/min (ref >60)
GFR calc non Af Amer: >60 mL/min (ref >60)
Glucose, Bld: 113 mg/dL — ABNORMAL HIGH (ref 70–99)
Potassium: 4.2 mmol/L (ref 3.5–5.1)
Sodium: 136 mmol/L (ref 135–145)
Total Bilirubin: 0.9 mg/dL (ref 0.3–1.2)
Total Protein: 8.1 g/dL (ref 6.5–8.1)

## 2018-08-31 MED ORDER — FAMOTIDINE IN NACL 20-0.9 MG/50ML-% IV SOLN
20.0000 mg | Freq: Once | INTRAVENOUS | Status: AC
  Administered 2018-08-31: 20 mg via INTRAVENOUS
  Filled 2018-08-31: qty 50

## 2018-08-31 MED ORDER — ONDANSETRON 8 MG PO TBDP: 8.0000 mg | ORAL_TABLET | Freq: Three times a day (TID) | ORAL | 2 refills | Status: DC | PRN

## 2018-08-31 MED ORDER — DEXAMETHASONE 4 MG PO TABS: 4.0000 mg | ORAL_TABLET | Freq: Every day | ORAL | 1 refills | Status: DC

## 2018-08-31 MED ORDER — HEPARIN SOD (PORK) LOCK FLUSH 100 UNIT/ML IV SOLN
500.0000 [IU] | Freq: Once | INTRAVENOUS | Status: AC
  Administered 2018-08-31: 500 [IU] via INTRAVENOUS

## 2018-08-31 MED ORDER — SODIUM CHLORIDE 0.9% FLUSH
10.0000 mL | Freq: Once | INTRAVENOUS | Status: AC
  Administered 2018-08-31: 10 mL via INTRAVENOUS

## 2018-08-31 MED ORDER — SODIUM CHLORIDE 0.9 % IV SOLN
Freq: Once | INTRAVENOUS | Status: AC
  Administered 2018-08-31: 12:00:00 via INTRAVENOUS
  Filled 2018-08-31: qty 4

## 2018-08-31 MED ORDER — DICYCLOMINE HCL 10 MG PO CAPS: 10.0000 mg | ORAL_CAPSULE | Freq: Three times a day (TID) | ORAL | 2 refills | Status: DC

## 2018-08-31 MED ORDER — SODIUM CHLORIDE 0.9 % IV SOLN
Freq: Once | INTRAVENOUS | Status: AC
  Administered 2018-08-31: 11:00:00 via INTRAVENOUS

## 2018-08-31 MED ORDER — SODIUM CHLORIDE 0.9 % IV SOLN
Freq: Once | INTRAVENOUS | Status: AC
  Administered 2018-08-31: 12:00:00 via INTRAVENOUS
  Filled 2018-08-31: qty 10

## 2018-08-31 NOTE — Progress Notes (Signed)
Patient to treatment area for hydration and nausea medications.  No s/s of distress noted. Patient complaints of nausea over the weekend.  Reviewed nausea medications and gave numbers to call if the patient needed [place] any other time during the week.  Patient and caregiver verbalized understanding.    1230-patient complains of indigestion.  Pepcid 20mg  IV verbal order Dr. Delton Coombes.    1300-patient denied indigestion.  No s/s of distress noted with family at side.   Patient tolerated hydration with no complaints voiced.  Port site clean and dry with good blood return noted.  Dressing changed for biodisc to be applied for tomorrows hydration.  Dressing clean and dry.  Patient stated she felt better and left ambulatory with VSs and no s/s of distress noted.

## 2018-08-31 NOTE — Progress Notes (Signed)
Patient arrived for nurse visit to remove suture/staples post- procedure removal of PlureX catheter 08/20/2018 with Dr.Van Trigt.  Suture/staples removed with no signs/ symptoms of infection noted.  Patient tolerated procedure well.  Patient/ family instructed to keep the incision sites clean and dry.  Patient/ family acknowledged instructions given.

## 2018-08-31 NOTE — Progress Notes (Signed)
08/31/18  Received orders for 8/17 and 8/18 for:  IV fliuids of NS 1 liter + Potassium chloride 20 meq IV + Magnesium sulfate 2 gm IV over 2 hours x 1 each day.  Plus:  Zofran 8 mg IVPB + Dexamethasone 10 mg IVPB x 1 each day.  Thank you for allowing me to participate in Michelle Holt's care.  T.O. Dr Beckey Downing LPN/Lenore Moyano Ronnald Ramp, PharmD

## 2018-08-31 NOTE — Assessment & Plan Note (Signed)
1.  Clinical stage IVa ovarian cancer, positive cytology of left pleural effusion: - CT CAP on 08/21/2018 shows bilateral solid cystic ovarian masses.  Scattered peritoneal and omental disease present.  Small left pleural effusion with stable talc pleurodesis on the left side.  Stable large intrasplenic hematoma. - She met with Dr. Denman George, 3 cycles of neoadjuvant chemotherapy followed by restaging CT scans was recommended. -We will follow-up on the results of germline mutation testing. - Cycle 1 of carboplatin and paclitaxel on 08/24/2018. - She reported nausea 2 to 3 days after chemotherapy.  Compazine did not help.  Phenergan also did not work.  She denied any vomiting.  She reported decreased appetite.  She lost 10 pounds. -She also developed diarrhea with stomach cramps on 08/29/2018.  She had diarrhea more than 5 times per day.  She was taking Imodium 1 tablet every 6 hours. - She will be given IV fluids with electrolytes.  We will start her on Zofran ODT 8 mg every 8 hours and dexamethasone in the mornings for nausea.  She will be also started on Bentyl for abdominal cramping. -We will set her up for fluids tomorrow along with Zofran. -I will reevaluate her in 1 week.  2.  Left chest wall pain: - Left Pleurx catheter was discontinued on 08/20/2018. -She is taking Dilaudid 4 mg every 6 hours as needed for pain.  3.  Splenic hematoma: -: Ligation of splenic artery on 08/01/2018.  CT CAP on 08/21/2018 showed stable hematoma. -Hemoglobin today stable.

## 2018-08-31 NOTE — Progress Notes (Signed)
[name], [place] [address]   CLINIC:  Medical Oncology/Hematology  PCP:  [name], MD [place] [address] [phone]   REASON FOR VISIT:  Follow-up for ovarian cancer, splenic hematoma.   BRIEF ONCOLOGIC HISTORY:  Oncology History  Ovarian cancer, bilateral ([place])  07/07/2018 Pathology Results   PLEURAL FLUID, LEFT (SPECIMEN 1 OF 1, COLLECTED 07/07/18): - MALIGNANT CELLS CONSISTENT WITH ADENOCARCINOMA - SEE COMMENT  Source Pleural Fluid, (Specimen 1 of 1, collected on 07/07/2018) Gross Specimen: Received is/are 1000cc of bloody red fluid with tissue. (TC:tc) Prepared: # Smears: 0 # Concentration Technique Slides (i.e. ThinPrep): 1 # Cell Block: 1 Conventional Additional Studies: Two Hematology slides labeled [id number] Comment The malignant cells are positive for cytokeratin 7, p53, WT-1, Pax-8, Moc31, ER (weak) and EMA but negative for cytokeratin 20, TTF-1, GATA-3, CDX-2 and D2-40. Overall, the phenotype is consistent with a gynecologic primary; clinical correlation recommended.   07/07/2018 Procedure   Successful ultrasound guided left thoracentesis yielding 2.0 L of pleural fluid   07/08/2018 Procedure   1. Technically successful placement of left 14 French pigtail chest drain, placed to Pleur-evac water-seal.   07/08/2018 Procedure   1. Technically successful five [place] double lumen power injectable PICC placement   07/09/2018 Imaging   Ct chest 1. There is a moderate, loculated left hydropneumothorax with a small air component and moderate fluid component. The largest loculated component is located posteriorly. There is a pigtail drainage catheter about the lateral pleural space. There is no obvious etiology, such as obvious mass or pleural disease.   2. There is a small right pleural effusion with associated atelectasis or consolidation and a subpleural consolidation of the superior segment right lower lobe (series  4, image 56), of uncertain significance, possibly infectious or inflammatory   07/10/2018 Imaging   Ct abdomen and pelvis: 1. The bilateral ovaries are enlarged by heterogeneous appearing cystic lesions, measuring 5.3 x 4.2 cm on the right (series 4, image 72) and 4.5 x 3.2 cm on the left (series 4, image 75). Consider dedicated pelvic ultrasound and/or pelvic MRI to further evaluate for solid components given high suspicion for GYN primary malignancy.   2. No other evidence of mass and no lymphadenopathy in the abdomen or pelvis.   3. Trace ascites. There is some suggestion of omental and peritoneal nodularity (e.g. Series 4, image 40), concerning for peritoneal metastatic disease.    4. Loculated left-sided pleural effusion with left-sided pleural drainage catheter in position. Small right pleural effusion   07/13/2018 Surgery   OPERATION: 1.  Left VATS (video-assisted thoracoscopic surgery) for drainage of loculated pleural effusion. 2.  Talc pleurodesis for malignant pleural effusion. 3.  Placement of PleurX catheter for management of malignant pleural effusion. 4.  Placement of On-Q analgesia catheter system.    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   07/13/2018 Pathology Results   Pleura, peel, Left Pleural - FIBRO-FIBRINOUS PLEURITIS - NEGATIVE FOR MALIGNANCY   07/18/2018 Initial Diagnosis   Ovarian cancer, bilateral ([place])   07/20/2018 Procedure   EGD impression: Normal proximal esophagus and mid esophagus. Mild distal esophageal rings; dilation not performed because of esophagitis. LA Grade C reflux esophagitis. Z-line regular, 30 cm from the incisors. 5 cm hiatal hernia. Non-bleeding gastric ulcer with no stigmata of bleeding. Gastritis.  Duodenal erosions without bleeding. Normal  second portion of the duodenum. No specimens collected.   08/19/2018 Genetic Testing   Negative genetic testing on the common hereditary cancer panel.  The Common Hereditary Gene Panel offered by [facility] includes sequencing and/or deletion duplication testing of the following 48 genes: APC, ATM, AXIN2, BARD1, BMPR1A, BRCA1, BRCA2, BRIP1, CDH1, CDK4, CDKN2A (p14ARF), CDKN2A (p16INK4a), CHEK2, CTNNA1, DICER1, EPCAM (Deletion/duplication testing only), GREM1 (promoter region deletion/duplication testing only), KIT, MEN1, MLH1, MSH2, MSH3, MSH6, MUTYH, NBN, NF1, NHTL1, PALB2, PDGFRA, PMS2, POLD1, POLE, PTEN, RAD50, RAD51C, RAD51D, RNF43, SDHB, SDHC, SDHD, SMAD4, SMARCA4. STK11, TP53, TSC1, TSC2, and VHL.  The following genes were evaluated for sequence changes only: SDHA and HOXB13 c.251G>A variant only. The report date is August 19, 2018.   08/24/2018 -  Chemotherapy   The patient had palonosetron (ALOXI) injection 0.25 mg, 0.25 mg, Intravenous,  Once, 1 of 6 cycles Administration: 0.25 mg (08/24/2018) CARBOplatin (PARAPLATIN) 740 mg in sodium chloride 0.9 % 250 mL chemo infusion, 740 mg (100 % of original dose 742.8 mg), Intravenous,  Once, 1 of 6 cycles Dose modification:   (original dose 742.8 mg, Cycle 1) Administration: 740 mg (08/24/2018) PACLitaxel (TAXOL) 324 mg in sodium chloride 0.9 % 500 mL chemo infusion (> '80mg'$ /m2), 175 mg/m2 = 324 mg, Intravenous,  Once, 1 of 6 cycles Administration: 324 mg (08/24/2018) fosaprepitant (EMEND) 150 mg, dexamethasone (DECADRON) 12 mg in sodium chloride 0.9 % 145 mL IVPB, , Intravenous,  Once, 1 of 6 cycles Administration:  (08/24/2018)  for chemotherapy treatment.       CANCER STAGING: Cancer Staging No matching staging information was found for the patient.   INTERVAL HISTORY:  Ms. Fulgham 48 y.o. female seen for follow-up of advanced ovarian cancer.  She was given chemotherapy on 08/24/2018.  2 to 3 days later she developed nausea which gradually  worsened.  She denied any vomiting.  However she was not able to eat any.  She lost 10 pounds.  She also developed abdominal cramping and diarrhea on 08/29/2018.  Diarrhea was watery with more than 5 stools per day.  She was taking Imodium right ear 1 tablet every 6 hours as needed.  She took Compazine and Phenergan for nausea which did not help.  Denied any tingling or numbness in the extremities.  Left chest wall pain is stable.  Cough and shortness of breath on exertion is present.   REVIEW OF SYSTEMS:  Review of Systems  Respiratory: Positive for cough and shortness of breath.   Cardiovascular: Positive for chest pain.  Gastrointestinal: Positive for diarrhea and nausea.  All other systems reviewed and are negative.    PAST MEDICAL/SURGICAL HISTORY:  Past Medical History:  Diagnosis Date  . Anxiety and depression   . Arthritis of facet joints at multiple vertebral levels    L5-S1  . Constipation   . Dyslipidemia   . Family history of breast cancer   . Family history of uterine cancer   . Insomnia   . Irritable bowel syndrome   . Migraine   . Muscle tension headache   . Ovarian carcinoma ([place])   . Plantar fasciitis of right foot   . Port-A-Cath in place 08/20/2018   Past Surgical History:  Procedure Laterality Date  . CHOLECYSTECTOMY  2008  . COLONOSCOPY N/A 08/13/2013   Procedure: COLONOSCOPY;  Surgeon: Rogene Houston, MD;  [facility];  Service: Endoscopy;  Laterality: N/A;  230-moved to 145 Ann to notify pt  .  ESOPHAGOGASTRODUODENOSCOPY    . ESOPHAGOGASTRODUODENOSCOPY (EGD) WITH PROPOFOL N/A 07/20/2018   Procedure: ESOPHAGOGASTRODUODENOSCOPY (EGD) WITH PROPOFOL;  Surgeon: Rogene Houston, MD;  [facility];  Service: Endoscopy;  Laterality: N/A;  Possible esophageal dilation.  . IR ANGIOGRAM SELECTIVE EACH ADDITIONAL VESSEL  08/01/2018  . IR ANGIOGRAM VISCERAL SELECTIVE  08/01/2018  . IR EMBO ART  VEN HEMORR LYMPH EXTRAV  INC GUIDE ROADMAPPING  08/01/2018   . IR IMAGING GUIDED PORT INSERTION  08/20/2018  . IR PERC PLEURAL DRAIN W/INDWELL CATH W/IMG GUIDE  07/08/2018  . IR THORACENTESIS ASP PLEURAL SPACE W/IMG GUIDE  07/07/2018  . IR US GUIDE VASC ACCESS RIGHT  08/01/2018  . PLEURAL EFFUSION DRAINAGE Left 07/13/2018   Procedure: DRAINAGE OF LOCULATED PLEURAL EFFUSION;  Surgeon: Ivin Poot, MD;  Location: [facility];  Service: Thoracic;  Laterality: Left;  . REMOVAL OF PLEURAL DRAINAGE CATHETER Left 08/20/2018   Procedure: REMOVAL OF PLEURAL DRAINAGE CATHETER;  Surgeon: Ivin Poot, MD;  Location: [facility];  Service: Thoracic;  Laterality: Left;  . REMOVAL OF PLEURAL DRAINAGE CATHETER Left 08/20/2018   Procedure: REMOVAL OF PLEURAL DRAINAGE CATHETER;  Surgeon: Ivin Poot, MD;  Location: [facility];  Service: Thoracic;  Laterality: Left;  . TALC PLEURODESIS Left 07/13/2018   Procedure: Talc Pleuradesis;  Surgeon: Prescott Gum, Collier Salina, MD;  Location: [facility];  Service: Thoracic;  Laterality: Left;  . TUBAL LIGATION Bilateral   . UTERINE ABLATION    . VIDEO ASSISTED THORACOSCOPY Left 07/13/2018   Procedure: VIDEO ASSISTED THORACOSCOPY;  Surgeon: Ivin Poot, MD;  Location: [facility] OR;  Service: Thoracic;  Laterality: Left;     SOCIAL HISTORY:  Social History   Socioeconomic History  . Marital status: Widowed    Spouse name: Not on file  . Number of children: 2  . Years of education: 2-College  . Highest education level: Not on file  Occupational History    Employer: [employer]  . Financial resource strain: Not hard at all  . Food insecurity    Worry: Never true    Inability: Never true  . Transportation needs    Medical: No    Non-medical: No  Tobacco Use  . Smoking status: Former Smoker    Packs/day: 0.50    Years: 17.00    Pack years: 8.50    Types: Cigarettes    Quit date: 06/22/2018    Years since quitting: 0.1  . Smokeless tobacco: Never Used  Substance and Sexual Activity  . Alcohol use: Yes    Alcohol/week: 1.0 standard  drinks    Types: 1 Glasses of wine per week    Comment: Drinks alcohol on occasion  . Drug use: No  . Sexual activity: Not on file  Lifestyle  . Physical activity    Days per week: 0 days    Minutes per session: 0 min  . Stress: Very much  Relationships  . Social connections    Talks on phone: More than three times a week    Gets together: More than three times a week    Attends religious service: 1 to 4 times per year    Active member of club or organization: No    Attends meetings of clubs or organizations: Never    Relationship status: Widowed  . Intimate partner violence    Fear of current or ex partner: No    Emotionally abused: No    Physically abused: No    Forced sexual  activity: No  Other Topics Concern  . Not on file  Social History Narrative   Patient lives at home with her daughter.    Patient has 2 children.    Patient is widowed.    Patient is right handed.    Patient has her Associates degree.       FAMILY HISTORY:  Family History  Problem Relation Age of Onset  . Hypertension Mother   . Obesity Mother   . Diabetes Mother   . Kidney disease Mother   . Peripheral vascular disease Father   . Atrial fibrillation Father   . COPD Brother   . Osteoporosis Brother   . Crohn's disease Sister   . Uterine cancer Sister 35       maternal half sister  . Breast cancer Maternal Aunt 72  . Colon cancer Neg Hx     CURRENT MEDICATIONS:  Outpatient Encounter Medications as of 08/31/2018  Medication Sig  . CARBOPLATIN IV Inject into the vein every 21 ( twenty-one) days.  . diazepam (VALIUM) 5 MG tablet TAKE ONE TABLET BY MOUTH AT BEDTIME AS NEEDED FOR ANXIETY  . docusate sodium (COLACE) 100 MG capsule Take 1 capsule (100 mg total) by mouth 2 (two) times daily.  Marland Kitchen gabapentin (NEURONTIN) 300 MG capsule Take 1 capsule (300 mg total) by mouth 3 (three) times daily.  Marland Kitchen HYDROmorphone (DILAUDID) 4 MG tablet Take 1 tablet (4 mg total) by mouth every 4 (four) hours as  needed for severe pain.  Marland Kitchen lidocaine-prilocaine (EMLA) cream Apply small amount to port a cath site and cover with plastic wrap one hour prior to chemotherapy appointments  . PACLITAXEL IV Inject into the vein every 21 ( twenty-one) days.  . polyethylene glycol (MIRALAX / GLYCOLAX) 17 g packet Take 17 g by mouth as needed.  . prochlorperazine (COMPAZINE) 10 MG tablet Take 1 tablet (10 mg total) by mouth every 6 (six) hours as needed (Nausea or vomiting).  . promethazine (PHENERGAN) 25 MG tablet Take 1 tablet (25 mg total) by mouth every 6 (six) hours as needed for nausea, vomiting or refractory nausea / vomiting.  . SUMAtriptan (IMITREX) 100 MG tablet TAKE ONE TABLET BY MOUTH PRN UP to TWICE DAILY AS NEEDED.  . benzonatate (TESSALON PERLES) 100 MG capsule Take 1 capsule (100 mg total) by mouth 3 (three) times daily as needed for cough.  . dexamethasone (DECADRON) 4 MG tablet Take 1 tablet (4 mg total) by mouth daily.  Marland Kitchen dicyclomine (BENTYL) 10 MG capsule Take 1 capsule (10 mg total) by mouth 4 (four) times daily -  before meals and at bedtime.  . ondansetron (ZOFRAN ODT) 8 MG disintegrating tablet Take 1 tablet (8 mg total) by mouth every 8 (eight) hours as needed for nausea or vomiting.  . Oxycodone HCl 10 MG TABS Take 1 tablet (10 mg total) by mouth every 4 (four) hours as needed.  . pantoprazole (PROTONIX) 40 MG tablet Take 1 tablet (40 mg total) by mouth 2 (two) times daily before a meal.   No facility-administered encounter medications on file as of 08/31/2018.     ALLERGIES:  Allergies  Allergen Reactions  . Morphine And Related Itching  . Nortriptyline Other (See Comments)    Significant weight gain  . Topamax [Topiramate] Diarrhea    nausea  . Actifed Cold-Allergy [Chlorpheniramine-Phenyleph Er] Rash  . Amoxicillin Rash    Did it involve swelling of the face/tongue/throat, SOB, or low BP? Unknown Did it involve sudden or severe  rash/hives, skin peeling, or any reaction on the  inside of your mouth or nose? Unknown Did you need to seek medical attention at a hospital or doctor's office? Unknown When did it last happen? teenager If all above answers are "NO", may proceed with cephalosporin use.   . Codeine Hives  . Erythromycin Rash  . Penicillins Rash    Did it involve swelling of the face/tongue/throat, SOB, or low BP? Unknown Did it involve sudden or severe rash/hives, skin peeling, or any reaction on the inside of your mouth or nose? Unknown Did you need to seek medical attention at a hospital or doctor's office? Unknown When did it last happen? teenager If all above answers are "NO", may proceed with cephalosporin use.   [name] [Pseudoephedrine Hcl] Rash     PHYSICAL EXAM:  ECOG Performance status: 1  Vitals:   08/31/18 1017  BP: 126/88  Pulse: (!) 111  Resp: 18  Temp: (!) 97.5 F (36.4 C)  SpO2: 99%   Filed Weights   08/31/18 1017  Weight: 150 lb 1.6 oz (68.1 kg)    Physical Exam Vitals signs reviewed.  Constitutional:      Appearance: Normal appearance.  Cardiovascular:     Rate and Rhythm: Normal rate and regular rhythm.     Heart sounds: Normal heart sounds.  Pulmonary:     Effort: Pulmonary effort is normal.     Breath sounds: Normal breath sounds.     Comments: Decreased breath sounds at left lung base.  Left Pleurx catheter present. Abdominal:     General: There is no distension.     Palpations: Abdomen is soft. There is no mass.  Skin:    General: Skin is warm.  Neurological:     General: No focal deficit present.     Mental Status: She is alert and oriented to person, place, and time.  Psychiatric:        Mood and Affect: Mood normal.        Behavior: Behavior normal.      LABORATORY DATA:  I have reviewed the labs as listed.  CBC    Component Value Date/Time   WBC 5.3 08/31/2018 0950   RBC 5.20 (H) 08/31/2018 0950   HGB 15.5 (H) 08/31/2018 0950   HCT 47.4 (H) 08/31/2018 0950   PLT 320  08/31/2018 0950   MCV 91.2 08/31/2018 0950   MCH 29.8 08/31/2018 0950   MCHC 32.7 08/31/2018 0950   RDW 14.8 08/31/2018 0950   LYMPHSABS 1.8 08/31/2018 0950   MONOABS 0.2 08/31/2018 0950   EOSABS 0.3 08/31/2018 0950   BASOSABS 0.1 08/31/2018 0950   CMP Latest Ref Rng & Units 08/31/2018 08/24/2018 08/20/2018  Glucose 70 - 99 mg/dL 113(H) 150(H) 94  BUN 6 - 20 mg/dL '14 8 7  '$ Creatinine 0.44 - 1.00 mg/dL 0.66 0.69 0.66  Sodium 135 - 145 mmol/L 136 138 137  Potassium 3.5 - 5.1 mmol/L 4.2 3.5 4.1  Chloride 98 - 111 mmol/L 99 102 101  CO2 22 - 32 mmol/L '25 25 25  '$ Calcium 8.9 - 10.3 mg/dL 10.0 9.1 9.4  Total Protein 6.5 - 8.1 g/dL 8.1 6.4(L) -  Total Bilirubin 0.3 - 1.2 mg/dL 0.9 0.4 -  Alkaline Phos 38 - 126 U/L 106 139(H) -  AST 15 - 41 U/L 33 33 -  ALT 0 - 44 U/L 34 23 -       DIAGNOSTIC IMAGING:  I have independently reviewed the scans and discussed  with the patient.   I have reviewed Venita Lick LPN's note and agree with the documentation.  I personally performed a face-to-face visit, made revisions and my assessment and plan is as follows.    ASSESSMENT & PLAN:   Ovarian cancer, bilateral ([place]) 1.  Clinical stage IVa ovarian cancer, positive cytology of left pleural effusion: - CT CAP on 08/21/2018 shows bilateral solid cystic ovarian masses.  Scattered peritoneal and omental disease present.  Small left pleural effusion with stable talc pleurodesis on the left side.  Stable large intrasplenic hematoma. - She met with Dr. Denman George, 3 cycles of neoadjuvant chemotherapy followed by restaging CT scans was recommended. -We will follow-up on the results of germline mutation testing. - Cycle 1 of carboplatin and paclitaxel on 08/24/2018. - She reported nausea 2 to 3 days after chemotherapy.  Compazine did not help.  Phenergan also did not work.  She denied any vomiting.  She reported decreased appetite.  She lost 10 pounds. -She also developed diarrhea with stomach cramps on 08/29/2018.   She had diarrhea more than 5 times per day.  She was taking Imodium 1 tablet every 6 hours. - She will be given IV fluids with electrolytes.  We will start her on Zofran ODT 8 mg every 8 hours and dexamethasone in the mornings for nausea.  She will be also started on Bentyl for abdominal cramping. -We will set her up for fluids tomorrow along with Zofran. -I will reevaluate her in 1 week.  2.  Left chest wall pain: - Left Pleurx catheter was discontinued on 08/20/2018. -She is taking Dilaudid 4 mg every 6 hours as needed for pain.  3.  Splenic hematoma: -: Ligation of splenic artery on 08/01/2018.  CT CAP on 08/21/2018 showed stable hematoma. -Hemoglobin today stable.   Total time spent is 25 minutes with more than 50% of the time spent face-to-face discussing treatment plan, counseling and coordination of care.  Orders placed this encounter:  No orders of the defined types were placed in this encounter.     Derek Jack, MD [place] [phone]  \

## 2018-08-31 NOTE — Patient Instructions (Addendum)
[facility] at [facility] Discharge Instructions  You were seen today by Dr. Katragadda. He went over your recent lab results. He will see you back in 1 week for labs and follow up.   Thank you for choosing [facility] at [facility] to provide your oncology and hematology care.  To afford each patient quality time with our provider, please arrive at least 15 minutes before your scheduled appointment time.   If you have a lab appointment with the Cancer Center please come in thru the  Main Entrance and check in at the main information desk  You need to re-schedule your appointment should you arrive 10 or more minutes late.  We strive to give you quality time with our providers, and arriving late affects you and other patients whose appointments are after yours.  Also, if you no show three or more times for appointments you may be dismissed from the clinic at the providers discretion.     Again, thank you for choosing [facility].  Our hope is that these requests will decrease the amount of time that you wait before being seen by our physicians.       _____________________________________________________________  Should you have questions after your visit to [facility], please contact our office at [phone] between the hours of 8:00 a.m. and 4:30 p.m.  Voicemails left after 4:00 p.m. will not be returned until the following business day.  For prescription refill requests, have your pharmacy contact our office and allow 72 hours.    Cancer Center Support Programs:   > Cancer Support Group  2nd Tuesday of the month 1pm-2pm, Journey Room    

## 2018-09-01 ENCOUNTER — Telehealth: Payer: Self-pay | Admitting: Genetic Counselor

## 2018-09-01 ENCOUNTER — Encounter (HOSPITAL_COMMUNITY): Payer: Self-pay

## 2018-09-01 ENCOUNTER — Other Ambulatory Visit (HOSPITAL_COMMUNITY): Payer: Self-pay | Admitting: Obstetrics & Gynecology

## 2018-09-01 ENCOUNTER — Inpatient Hospital Stay (HOSPITAL_COMMUNITY): Payer: Medicaid Other

## 2018-09-01 VITALS — BP 112/72 | HR 89 | Temp 97.4°F | Resp 18 | Wt 152.8 lb

## 2018-09-01 DIAGNOSIS — C563 Malignant neoplasm of bilateral ovaries: Secondary | ICD-10-CM

## 2018-09-01 DIAGNOSIS — Z5111 Encounter for antineoplastic chemotherapy: Secondary | ICD-10-CM | POA: Diagnosis not present

## 2018-09-01 DIAGNOSIS — C561 Malignant neoplasm of right ovary: Secondary | ICD-10-CM

## 2018-09-01 DIAGNOSIS — Z1231 Encounter for screening mammogram for malignant neoplasm of breast: Secondary | ICD-10-CM

## 2018-09-01 MED ORDER — SODIUM CHLORIDE 0.9% FLUSH
10.0000 mL | Freq: Once | INTRAVENOUS | Status: AC | PRN
  Administered 2018-09-01: 10 mL

## 2018-09-01 MED ORDER — SODIUM CHLORIDE 0.9 % IV SOLN
Freq: Once | INTRAVENOUS | Status: AC
  Administered 2018-09-01: 09:00:00 via INTRAVENOUS
  Filled 2018-09-01: qty 10

## 2018-09-01 MED ORDER — HEPARIN SOD (PORK) LOCK FLUSH 100 UNIT/ML IV SOLN
500.0000 [IU] | Freq: Once | INTRAVENOUS | Status: AC | PRN
  Administered 2018-09-01: 500 [IU]

## 2018-09-01 NOTE — Telephone Encounter (Signed)
Revealed negative genetic testing.  Discussed that we do not know why she has ovarian cancer or why there is cancer in the family. It could be due to a different gene that we are not testing, or maybe our current technology may not be able to pick something up.  It will be important for her to keep in contact with genetics to keep up with whether additional testing may be needed.      

## 2018-09-01 NOTE — Progress Notes (Signed)
Michelle Holt tolerated IV hydration with magnesium and potassium well without complaints or incident. Pt discharged self ambulatory in satisfactory condition accompanied by a friend

## 2018-09-01 NOTE — Patient Instructions (Signed)
[facility] at [facility] Discharge Instructions  Received IV hydration with magnesium and potassium today. Follow-up as scheduled. Call clinic for any questions or concerns   Thank you for choosing [facility] at [facility] to provide your oncology and hematology care.  To afford each patient quality time with our provider, please arrive at least 15 minutes before your scheduled appointment time.   If you have a lab appointment with the Cancer Center please come in thru the  Main Entrance and check in at the main information desk  You need to re-schedule your appointment should you arrive 10 or more minutes late.  We strive to give you quality time with our providers, and arriving late affects you and other patients whose appointments are after yours.  Also, if you no show three or more times for appointments you may be dismissed from the clinic at the providers discretion.     Again, thank you for choosing [facility].  Our hope is that these requests will decrease the amount of time that you wait before being seen by our physicians.       _____________________________________________________________  Should you have questions after your visit to [facility], please contact our office at [phone] between the hours of 8:00 a.m. and 4:30 p.m.  Voicemails left after 4:00 p.m. will not be returned until the following business day.  For prescription refill requests, have your pharmacy contact our office and allow 72 hours.    Cancer Center Support Programs:   > Cancer Support Group  2nd Tuesday of the month 1pm-2pm, Journey Room   

## 2018-09-02 ENCOUNTER — Ambulatory Visit: Payer: Self-pay | Admitting: Cardiothoracic Surgery

## 2018-09-04 ENCOUNTER — Telehealth (HOSPITAL_COMMUNITY): Payer: Self-pay | Admitting: Hematology

## 2018-09-04 ENCOUNTER — Encounter (HOSPITAL_COMMUNITY): Payer: Self-pay

## 2018-09-04 NOTE — Telephone Encounter (Signed)
Contacted pt and advised her to bring in [place] on 8/25. Pt will also bring in a letter of support from her sister.

## 2018-09-08 ENCOUNTER — Inpatient Hospital Stay (HOSPITAL_COMMUNITY): Payer: Medicaid Other

## 2018-09-08 ENCOUNTER — Inpatient Hospital Stay (HOSPITAL_BASED_OUTPATIENT_CLINIC_OR_DEPARTMENT_OTHER): Payer: Medicaid Other | Admitting: Hematology

## 2018-09-08 ENCOUNTER — Encounter (HOSPITAL_COMMUNITY): Payer: Self-pay | Admitting: Hematology

## 2018-09-08 ENCOUNTER — Telehealth (HOSPITAL_COMMUNITY): Payer: Self-pay

## 2018-09-08 ENCOUNTER — Other Ambulatory Visit: Payer: Self-pay

## 2018-09-08 ENCOUNTER — Telehealth (HOSPITAL_COMMUNITY): Payer: Self-pay | Admitting: Hematology

## 2018-09-08 DIAGNOSIS — C562 Malignant neoplasm of left ovary: Secondary | ICD-10-CM | POA: Diagnosis not present

## 2018-09-08 DIAGNOSIS — C561 Malignant neoplasm of right ovary: Secondary | ICD-10-CM | POA: Diagnosis not present

## 2018-09-08 DIAGNOSIS — C563 Malignant neoplasm of bilateral ovaries: Secondary | ICD-10-CM

## 2018-09-08 DIAGNOSIS — Z5111 Encounter for antineoplastic chemotherapy: Secondary | ICD-10-CM | POA: Diagnosis not present

## 2018-09-08 LAB — CBC WITH DIFFERENTIAL/PLATELET
Abs Immature Granulocytes: 0.03 10*3/uL (ref 0.00–0.07)
Basophils Absolute: 0 10*3/uL (ref 0.0–0.1)
Basophils Relative: 1 %
Eosinophils Absolute: 0.1 10*3/uL (ref 0.0–0.5)
Eosinophils Relative: 1 %
HCT: 44.7 % (ref 36.0–46.0)
Hemoglobin: 14 g/dL (ref 12.0–15.0)
Immature Granulocytes: 1 %
Lymphocytes Relative: 46 %
Lymphs Abs: 2.3 10*3/uL (ref 0.7–4.0)
MCH: 29.9 pg (ref 26.0–34.0)
MCHC: 31.3 g/dL (ref 30.0–36.0)
MCV: 95.3 fL (ref 80.0–100.0)
Monocytes Absolute: 0.9 10*3/uL (ref 0.1–1.0)
Monocytes Relative: 19 %
Neutro Abs: 1.5 10*3/uL — ABNORMAL LOW (ref 1.7–7.7)
Neutrophils Relative %: 32 %
Platelets: 244 10*3/uL (ref 150–400)
RBC: 4.69 MIL/uL (ref 3.87–5.11)
RDW: 17 % — ABNORMAL HIGH (ref 11.5–15.5)
WBC: 4.8 10*3/uL (ref 4.0–10.5)
nRBC: 0 % (ref 0.0–0.2)

## 2018-09-08 LAB — COMPREHENSIVE METABOLIC PANEL
ALT: 25 U/L (ref 0–44)
AST: 21 U/L (ref 15–41)
Albumin: 3.9 g/dL (ref 3.5–5.0)
Alkaline Phosphatase: 77 U/L (ref 38–126)
Anion gap: 8 (ref 5–15)
BUN: 14 mg/dL (ref 6–20)
CO2: 29 mmol/L (ref 22–32)
Calcium: 9.4 mg/dL (ref 8.9–10.3)
Chloride: 100 mmol/L (ref 98–111)
Creatinine, Ser: 0.72 mg/dL (ref 0.44–1.00)
GFR calc Af Amer: >60 mL/min (ref >60)
GFR calc non Af Amer: >60 mL/min (ref >60)
Glucose, Bld: 99 mg/dL (ref 70–99)
Potassium: 3.9 mmol/L (ref 3.5–5.1)
Sodium: 137 mmol/L (ref 135–145)
Total Bilirubin: 0.4 mg/dL (ref 0.3–1.2)
Total Protein: 7 g/dL (ref 6.5–8.1)

## 2018-09-08 MED ORDER — OXYCODONE HCL 10 MG PO TABS: 10.0000 mg | ORAL_TABLET | Freq: Four times a day (QID) | ORAL | 0 refills | Status: DC | PRN

## 2018-09-08 NOTE — Patient Instructions (Addendum)
[facility] at [facility] Discharge Instructions  You were seen today by Dr. Katragadda. He went over your recent lab results. He will see you back in 1 week for labs and follow up.   Thank you for choosing [facility] at [facility] to provide your oncology and hematology care.  To afford each patient quality time with our provider, please arrive at least 15 minutes before your scheduled appointment time.   If you have a lab appointment with the Cancer Center please come in thru the  Main Entrance and check in at the main information desk  You need to re-schedule your appointment should you arrive 10 or more minutes late.  We strive to give you quality time with our providers, and arriving late affects you and other patients whose appointments are after yours.  Also, if you no show three or more times for appointments you may be dismissed from the clinic at the providers discretion.     Again, thank you for choosing [facility].  Our hope is that these requests will decrease the amount of time that you wait before being seen by our physicians.       _____________________________________________________________  Should you have questions after your visit to [facility], please contact our office at [phone] between the hours of 8:00 a.m. and 4:30 p.m.  Voicemails left after 4:00 p.m. will not be returned until the following business day.  For prescription refill requests, have your pharmacy contact our office and allow 72 hours.    Cancer Center Support Programs:   > Cancer Support Group  2nd Tuesday of the month 1pm-2pm, Journey Room    

## 2018-09-08 NOTE — Telephone Encounter (Signed)
Pt bought in Letter of support and [employer]. Pt also stated her disability claim is pending.

## 2018-09-08 NOTE — Assessment & Plan Note (Addendum)
1.  Clinical stage IVa ovarian cancer, positive cytology of left pleural effusion: -CT CAP on 08/21/2018 shows bilateral solid cystic ovarian masses.  Scattered peritoneal and omental disease present.  Small left pleural effusion with stable talc pleurodesis on the left side.  Stable large intrasplenic hematoma. -She met with Dr. Sherrell Puller, 3 cycles of neoadjuvant chemotherapy followed by restaging CT scans was recommended. - Germline mutation testing was sent but not resulted yet. -Cycle 1 of carboplatin and paclitaxel on 08/24/2018. - She had felt much better during the last week after treatment.  She did have problems with nausea, diarrhea, stomach cramps during the first week after treatment. -We reviewed her blood work which is within normal limits.  She will be reevaluated next week for treatment.  2.  Left chest wall pain: -Left Pleurx catheter was discontinued on 08/20/2018. - She was taking Dilaudid 4 mg every 6 hours which is not helping that much.  We will switch her medicine to oxycodone 10 mg every 6 hours.  Patient took this in the past with help.  3.  Nausea/vomiting: - She is taking Zofran ODT 8 mg every 8 hours.  She is also taking dexamethasone 4 mg in the morning as needed. -She is taking Bentyl as needed for abdominal cramping.  4.  Splenic hematoma: -Ligation of the splenic artery on 08/01/2018.  CT CAP on 08/21/2018 showed stable hematoma. -Hemoglobin today stable.

## 2018-09-08 NOTE — Telephone Encounter (Signed)
Contacted Dr. Lucianne Lei Trigt's office regarding returning patient's Plurex catheters.  Left message for the nurse to call clinic back.

## 2018-09-08 NOTE — Progress Notes (Signed)
Michelle Holt,  [id number]   CLINIC:  Medical Oncology/Hematology  PCP:  Michelle Chroman, MD [place] [address] [phone]   REASON FOR VISIT:  Follow-up for ovarian cancer, splenic hematoma.   BRIEF ONCOLOGIC HISTORY:  Oncology History  Ovarian cancer, bilateral ([place])  07/07/2018 Pathology Results   PLEURAL FLUID, LEFT (SPECIMEN 1 OF 1, COLLECTED 07/07/18): - MALIGNANT CELLS CONSISTENT WITH ADENOCARCINOMA - SEE COMMENT  Source Pleural Fluid, (Specimen 1 of 1, collected on 07/07/2018) Gross Specimen: Received is/are 1000cc of bloody red fluid with tissue. (TC:tc) Prepared: # Smears: 0 # Concentration Technique Slides (i.e. ThinPrep): 1 # Cell Block: 1 Conventional Additional Studies: Two Hematology slides labeled [id number] Comment The malignant cells are positive for cytokeratin 7, p53, WT-1, Pax-8, Moc31, ER (weak) and EMA but negative for cytokeratin 20, TTF-1, GATA-3, CDX-2 and D2-40. Overall, the phenotype is consistent with a gynecologic primary; clinical correlation recommended.   07/07/2018 Procedure   Successful ultrasound guided left thoracentesis yielding 2.0 L of pleural fluid   07/08/2018 Procedure   1. Technically successful placement of left 14 French pigtail chest drain, placed to Pleur-evac water-seal.   07/08/2018 Procedure   1. Technically successful five [place] double lumen power injectable PICC placement   07/09/2018 Imaging   Ct chest 1. There is a moderate, loculated left hydropneumothorax with a small air component and moderate fluid component. The largest loculated component is located posteriorly. There is a pigtail drainage catheter about the lateral pleural space. There is no obvious etiology, such as obvious mass or pleural disease.   2. There is a small right pleural effusion with associated atelectasis or consolidation and a subpleural consolidation of the superior segment right lower lobe (series  4, image 56), of uncertain significance, possibly infectious or inflammatory   07/10/2018 Imaging   Ct abdomen and pelvis: 1. The bilateral ovaries are enlarged by heterogeneous appearing cystic lesions, measuring 5.3 x 4.2 cm on the right (series 4, image 72) and 4.5 x 3.2 cm on the left (series 4, image 75). Consider dedicated pelvic ultrasound and/or pelvic MRI to further evaluate for solid components given high suspicion for GYN primary malignancy.   2. No other evidence of mass and no lymphadenopathy in the abdomen or pelvis.   3. Trace ascites. There is some suggestion of omental and peritoneal nodularity (e.g. Series 4, image 41), concerning for peritoneal metastatic disease.    4. Loculated left-sided pleural effusion with left-sided pleural drainage catheter in position. Small right pleural effusion   07/13/2018 Surgery   OPERATION: 1.  Left VATS (video-assisted thoracoscopic surgery) for drainage of loculated pleural effusion. 2.  Talc pleurodesis for malignant pleural effusion. 3.  Placement of PleurX catheter for management of malignant pleural effusion. 4.  Placement of On-Q analgesia catheter system.    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   07/13/2018 Pathology Results   Pleura, peel, Left Pleural - FIBRO-FIBRINOUS PLEURITIS - NEGATIVE FOR MALIGNANCY   07/18/2018 Initial Diagnosis   Ovarian cancer, bilateral ([place])   07/20/2018 Procedure   EGD impression: Normal proximal esophagus and mid esophagus. Mild distal esophageal rings; dilation not performed because of esophagitis. LA Grade C reflux esophagitis. Z-line regular, 30 cm from the incisors. 5 cm hiatal hernia. Non-bleeding gastric ulcer with no stigmata of bleeding. Gastritis.  Duodenal erosions without bleeding. Normal  second portion of the duodenum. No specimens collected.   08/19/2018 Genetic Testing   Negative genetic testing on the common hereditary cancer panel.  The Common Hereditary Gene Panel offered by [facility] includes sequencing and/or deletion duplication testing of the following 48 genes: APC, ATM, AXIN2, BARD1, BMPR1A, BRCA1, BRCA2, BRIP1, CDH1, CDK4, CDKN2A (p14ARF), CDKN2A (p16INK4a), CHEK2, CTNNA1, DICER1, EPCAM (Deletion/duplication testing only), GREM1 (promoter region deletion/duplication testing only), KIT, MEN1, MLH1, MSH2, MSH3, MSH6, MUTYH, NBN, NF1, NHTL1, PALB2, PDGFRA, PMS2, POLD1, POLE, PTEN, RAD50, RAD51C, RAD51D, RNF43, SDHB, SDHC, SDHD, SMAD4, SMARCA4. STK11, TP53, TSC1, TSC2, and VHL.  The following genes were evaluated for sequence changes only: SDHA and HOXB13 c.251G>A variant only. The report date is August 19, 2018.   08/24/2018 -  Chemotherapy   The patient had palonosetron (ALOXI) injection 0.25 mg, 0.25 mg, Intravenous,  Once, 1 of 6 cycles Administration: 0.25 mg (08/24/2018) CARBOplatin (PARAPLATIN) 740 mg in sodium chloride 0.9 % 250 mL chemo infusion, 740 mg (100 % of original dose 742.8 mg), Intravenous,  Once, 1 of 6 cycles Dose modification:   (original dose 742.8 mg, Cycle 1) Administration: 740 mg (08/24/2018) PACLitaxel (TAXOL) 324 mg in sodium chloride 0.9 % 500 mL chemo infusion (> 76m/m2), 175 mg/m2 = 324 mg, Intravenous,  Once, 1 of 6 cycles Administration: 324 mg (08/24/2018) fosaprepitant (EMEND) 150 mg, dexamethasone (DECADRON) 12 mg in sodium chloride 0.9 % 145 mL IVPB, , Intravenous,  Once, 1 of 6 cycles Administration:  (08/24/2018)  for chemotherapy treatment.       CANCER STAGING: Cancer Staging No matching staging information was found for the patient.   INTERVAL HISTORY:  [name]. female seen for follow-up of ovarian cancer.  Last cycle of chemotherapy was on 08/24/2018.  Last week has been mostly uneventful although she had some nausea  after eating.  Denied any vomiting.  She reported some constipation associated with Zofran.  She also reported some shortness of breath with activity.  Left-sided chest pain is not so well controlled with Dilaudid.  She requests to go back on oxycodone which she used previously.  Activity 75%.  Energy levels are 50%.  No headaches reported.  REVIEW OF SYSTEMS:  Review of Systems  Respiratory: Positive for shortness of breath.   Gastrointestinal: Positive for constipation and nausea.  All other systems reviewed and are negative.    PAST MEDICAL/SURGICAL HISTORY:  Past Medical History:  Diagnosis Date  . Anxiety and depression   . Arthritis of facet joints at multiple vertebral levels    L5-S1  . Constipation   . Dyslipidemia   . Family history of breast cancer   . Family history of uterine cancer   . Insomnia   . Irritable bowel syndrome   . Migraine   . Muscle tension headache   . [place]ian [place] ([place]   . Plantar fasciitis of right foot   . Port-A-Cath in place 08/20/2018   Past Surgical History:  Procedure Laterality Date  . CHOLECYSTECTOMY  2008  . COLONOSCOPY N/A 08/13/2013   Procedure: COLONOSCOPY;  Surgeon: NRogene Houston MD;  [facility];  Service: Endoscopy;  Laterality: N/A;  230-moved to 145 Ann to notify pt  . ESOPHAGOGASTRODUODENOSCOPY    . ESOPHAGOGASTRODUODENOSCOPY (EGD) WITH PROPOFOL N/A 07/20/2018   Procedure: ESOPHAGOGASTRODUODENOSCOPY (EGD) WITH PROPOFOL;  Surgeon: RRogene Houston MD;  [facility];  Service: Endoscopy;  Laterality: N/A;  Possible esophageal dilation.  . IR  ANGIOGRAM SELECTIVE EACH ADDITIONAL VESSEL  08/01/2018  . IR ANGIOGRAM VISCERAL SELECTIVE  08/01/2018  . IR EMBO ART  VEN HEMORR LYMPH EXTRAV  INC GUIDE ROADMAPPING  08/01/2018  . IR IMAGING GUIDED PORT INSERTION  08/20/2018  . IR PERC PLEURAL DRAIN W/INDWELL CATH W/IMG GUIDE  07/08/2018  . IR THORACENTESIS ASP PLEURAL SPACE W/IMG GUIDE  07/07/2018  . IR US GUIDE VASC  ACCESS RIGHT  08/01/2018  . PLEURAL EFFUSION DRAINAGE Left 07/13/2018   Procedure: DRAINAGE OF LOCULATED PLEURAL EFFUSION;  Surgeon: Ivin Poot, MD;  Location: [facility];  Service: Thoracic;  Laterality: Left;  . REMOVAL OF PLEURAL DRAINAGE CATHETER Left 08/20/2018   Procedure: REMOVAL OF PLEURAL DRAINAGE CATHETER;  Surgeon: Ivin Poot, MD;  Location: [facility];  Service: Thoracic;  Laterality: Left;  . REMOVAL OF PLEURAL DRAINAGE CATHETER Left 08/20/2018   Procedure: REMOVAL OF PLEURAL DRAINAGE CATHETER;  Surgeon: Ivin Poot, MD;  Location: [facility];  Service: Thoracic;  Laterality: Left;  . TALC PLEURODESIS Left 07/13/2018   Procedure: Talc Pleuradesis;  Surgeon: Prescott Gum, Collier Salina, MD;  Location: [facility];  Service: Thoracic;  Laterality: Left;  . TUBAL LIGATION Bilateral   . UTERINE ABLATION    . VIDEO ASSISTED THORACOSCOPY Left 07/13/2018   Procedure: VIDEO ASSISTED THORACOSCOPY;  Surgeon: Ivin Poot, MD;  Location: [facility];  Service: Thoracic;  Laterality: Left;     SOCIAL HISTORY:  Social History   Socioeconomic History  . Marital status: Widowed    Spouse name: Not on file  . Number of children: 2  . Years of education: 2-College  . Highest education level: Not on file  Occupational History    Employer: [employer]  . Financial resource strain: Not hard at all  . Food insecurity    Worry: Never true    Inability: Never true  . Transportation needs    Medical: No    Non-medical: No  Tobacco Use  . Smoking status: Former Smoker    Packs/day: 0.50    Years: 17.00    Pack years: 8.50    Types: Cigarettes    Quit date: 06/22/2018    Years since quitting: 0.2  . Smokeless tobacco: Never Used  Substance and Sexual Activity  . Alcohol use: Yes    Alcohol/week: 1.0 standard drinks    Types: 1 Glasses of wine per week    Comment: Drinks alcohol on occasion  . Drug use: No  . Sexual activity: Not on file  Lifestyle  . Physical activity    Days per week: 0 days     Minutes per session: 0 min  . Stress: Very much  Relationships  . Social connections    Talks on phone: More than three times a week    Gets together: More than three times a week    Attends religious service: 1 to 4 times per year    Active member of club or organization: No    Attends meetings of clubs or organizations: Never    Relationship status: Widowed  . Intimate partner violence    Fear of current or ex partner: No    Emotionally abused: No    Physically abused: No    Forced sexual activity: No  Other Topics Concern  . Not on file  Social History Narrative   Patient lives at home with her daughter.    Patient has 2 children.    Patient is widowed.    Patient is  right handed.    Patient has her Associates degree.       FAMILY HISTORY:  Family History  Problem Relation Age of Onset  . Hypertension Mother   . Obesity Mother   . Diabetes Mother   . Kidney disease Mother   . Peripheral vascular disease Father   . Atrial fibrillation Father   . COPD Brother   . Osteoporosis Brother   . Crohn's disease Sister   . Uterine cancer Sister 75       maternal half sister  . Breast cancer Maternal Aunt 49  . Colon cancer Neg Hx     CURRENT MEDICATIONS:  Outpatient Encounter Medications as of 09/08/2018  Medication Sig  . benzonatate (TESSALON PERLES) 100 MG capsule Take 1 capsule (100 mg total) by mouth 3 (three) times daily as needed for cough.  . CARBOPLATIN IV Inject into the vein every 21 ( twenty-one) days.  Marland Kitchen dexamethasone (DECADRON) 4 MG tablet Take 1 tablet (4 mg total) by mouth daily.  . diazepam (VALIUM) 5 MG tablet TAKE ONE TABLET BY MOUTH AT BEDTIME AS NEEDED FOR ANXIETY  . dicyclomine (BENTYL) 10 MG capsule Take 1 capsule (10 mg total) by mouth 4 (four) times daily -  before meals and at bedtime.  . docusate sodium (COLACE) 100 MG capsule Take 1 capsule (100 mg total) by mouth 2 (two) times daily.  Marland Kitchen gabapentin (NEURONTIN) 300 MG capsule Take 1 capsule  (300 mg total) by mouth 3 (three) times daily.  Marland Kitchen HYDROmorphone (DILAUDID) 4 MG tablet Take 1 tablet (4 mg total) by mouth every 4 (four) hours as needed for severe pain.  Marland Kitchen ibuprofen (ADVIL) 600 MG tablet Take 600 mg by mouth every 6 (six) hours as needed.  . lidocaine-prilocaine (EMLA) cream Apply small amount to port a cath site and cover with plastic wrap one hour prior to chemotherapy appointments  . ondansetron (ZOFRAN ODT) 8 MG disintegrating tablet Take 1 tablet (8 mg total) by mouth every 8 (eight) hours as needed for nausea or vomiting.  Marland Kitchen PACLITAXEL IV Inject into the vein every 21 ( twenty-one) days.  . polyethylene glycol (MIRALAX / GLYCOLAX) 17 g packet Take 17 g by mouth as needed.  . prochlorperazine (COMPAZINE) 10 MG tablet Take 1 tablet (10 mg total) by mouth every 6 (six) hours as needed (Nausea or vomiting).  . promethazine (PHENERGAN) 25 MG tablet Take 1 tablet (25 mg total) by mouth every 6 (six) hours as needed for nausea, vomiting or refractory nausea / vomiting.  . SUMAtriptan (IMITREX) 100 MG tablet TAKE ONE TABLET BY MOUTH PRN UP to TWICE DAILY AS NEEDED.  . [DISCONTINUED] Oxycodone HCl 10 MG TABS Take 1 tablet (10 mg total) by mouth every 4 (four) hours as needed.  . Oxycodone HCl 10 MG TABS Take 1 tablet (10 mg total) by mouth every 6 (six) hours as needed.  . pantoprazole (PROTONIX) 40 MG tablet Take 1 tablet (40 mg total) by mouth 2 (two) times daily before a meal.   No facility-administered encounter medications on file as of 09/08/2018.     ALLERGIES:  Allergies  Allergen Reactions  . Morphine And Related Itching  . Nortriptyline Other (See Comments)    Significant weight gain  . Topamax [Topiramate] Diarrhea    nausea  . Actifed Cold-Allergy [Chlorpheniramine-Phenyleph Er] Rash  . Amoxicillin Rash    Did it involve swelling of the face/tongue/throat, SOB, or low BP? Unknown Did it involve sudden or severe rash/hives, skin  peeling, or any reaction on the  inside of your mouth or nose? Unknown Did you need to seek medical attention at a hospital or doctor's office? Unknown When did it last happen? teenager If all above answers are "NO", may proceed with cephalosporin use.   . Codeine Hives  . Erythromycin Rash  . Penicillins Rash    Did it involve swelling of the face/tongue/throat, SOB, or low BP? Unknown Did it involve sudden or severe rash/hives, skin peeling, or any reaction on the inside of your mouth or nose? Unknown Did you need to seek medical attention at a hospital or doctor's office? Unknown When did it last happen? teenager If all above answers are "NO", may proceed with cephalosporin use.   [name] [Pseudoephedrine Hcl] Rash     PHYSICAL EXAM:  ECOG Performance status: 1  Vitals:   09/08/18 1027  BP: 123/74  Pulse: 77  Resp: 18  Temp: (!) 97.3 F (36.3 C)  SpO2: 99%   Filed Weights   09/08/18 1027  Weight: 158 lb 11.2 oz (72 kg)    Physical Exam Vitals signs reviewed.  Constitutional:      Appearance: Normal appearance.  Cardiovascular:     Rate and Rhythm: Normal rate and regular rhythm.     Heart sounds: Normal heart sounds.  Pulmonary:     Effort: Pulmonary effort is normal.     Breath sounds: Normal breath sounds.     Comments: Decreased breath sounds at left lung base.  Left Pleurx catheter present. Abdominal:     General: There is no distension.     Palpations: Abdomen is soft. There is no mass.  Skin:    General: Skin is warm.  Neurological:     General: No focal deficit present.     Mental Status: She is alert and oriented to person, place, and time.  Psychiatric:        Mood and Affect: Mood normal.        Behavior: Behavior normal.      LABORATORY DATA:  I have reviewed the labs as listed.  CBC    Component Value Date/Time   WBC 4.8 09/08/2018 0825   RBC 4.69 09/08/2018 0825   HGB 14.0 09/08/2018 0825   HCT 44.7 09/08/2018 0825   PLT 244 09/08/2018 0825   MCV  95.3 09/08/2018 0825   MCH 29.9 09/08/2018 0825   MCHC 31.3 09/08/2018 0825   RDW 17.0 (H) 09/08/2018 0825   LYMPHSABS 2.3 09/08/2018 0825   MONOABS 0.9 09/08/2018 0825   EOSABS 0.1 09/08/2018 0825   BASOSABS 0.0 09/08/2018 0825   CMP Latest Ref Rng & Units 09/08/2018 08/31/2018 08/24/2018  Glucose 70 - 99 mg/dL 99 113(H) 150(H)  BUN 6 - 20 mg/dL _0 Creatinine 0.44 - 1.00 mg/dL 0.72 0.66 0.69  Sodium 135 - 145 mmol/L 137 136 138  Potassium 3.5 - 5.1 mmol/L 3.9 4.2 3.5  Chloride 98 - 111 mmol/L 100 99 102  CO2 22 - 32 mmol/L _1 Calcium 8.9 - 10.3 mg/dL 9.4 10.0 9.1  Total Protein 6.5 - 8.1 g/dL 7.0 8.1 6.4(L)  Total Bilirubin 0.3 - 1.2 mg/dL 0.4 0.9 0.4  Alkaline Phos 38 - 126 U/L 77 106 139(H)  AST 15 - 41 U/L 21 33 33  ALT 0 - 44 U/L 25 34 23       DIAGNOSTIC IMAGING:  I have independently reviewed the scans and discussed with the patient.  I have reviewed Venita Lick LPN's note and agree with the documentation.  I personally performed a face-to-face visit, made revisions and my assessment and plan is as follows.    ASSESSMENT & PLAN:   Ovarian cancer, bilateral ([place]) 1.  Clinical stage IVa ovarian cancer, positive cytology of left pleural effusion: -CT CAP on 08/21/2018 shows bilateral solid cystic ovarian masses.  Scattered peritoneal and omental disease present.  Small left pleural effusion with stable talc pleurodesis on the left side.  Stable large intrasplenic hematoma. -She met with Dr. Sherrell Puller, 3 cycles of neoadjuvant chemotherapy followed by restaging CT scans was recommended. - Germline mutation testing was sent but not resulted yet. -Cycle 1 of carboplatin and paclitaxel on 08/24/2018. - She had felt much better during the last week after treatment.  She did have problems with nausea, diarrhea, stomach cramps during the first week after treatment. -We reviewed her blood work which is within normal limits.  She will be reevaluated next week for  treatment.  2.  Left chest wall pain: -Left Pleurx catheter was discontinued on 08/20/2018. - She was taking Dilaudid 4 mg every 6 hours which is not helping that much.  We will switch her medicine to oxycodone 10 mg every 6 hours.  Patient took this in the past with help.  3.  Nausea/vomiting: - She is taking Zofran ODT 8 mg every 8 hours.  She is also taking dexamethasone 4 mg in the morning as needed. -She is taking Bentyl as needed for abdominal cramping.  4.  Splenic hematoma: -Ligation of the splenic artery on 08/01/2018.  CT CAP on 08/21/2018 showed stable hematoma. -Hemoglobin today stable.   Total time spent is 25 minutes with more than 50% of the time spent face-to-face discussing treatment plan, counseling and coordination of care.  Orders placed this encounter:  No orders of the defined types were placed in this encounter.     Derek Jack, MD [place] [phone]  \

## 2018-09-11 ENCOUNTER — Ambulatory Visit (HOSPITAL_COMMUNITY): Payer: Self-pay

## 2018-09-11 NOTE — Progress Notes (Signed)
Nutrition Follow-up:  Patient with stage IV ovarian cancer with left pleural effusion.  Patient undergoing neoadjuvant chemotherapy.    Spoke with patient via phone for nutrition follow-up.  Patient reports that for about 3 days following first treatment could not eat or drink water without having diarrhea and nausea.  Patient reports that she is feeling much better after getting fluids and currently on steriods.  "I am eating everything in the house."  Eating potato salad during visit.  Reports that she is eating vegetables, chicken and drinking [employer] shake.    Medications: reviewed  Labs: reviewed  Anthropometrics:   Weight decreased to 150 lb but now back up to 158 lb (starting weight)  NUTRITION DIAGNOSIS: Inadequate oral intake stable   INTERVENTION:  Discussed briefly foods to eat during episodes of nausea and diarrhea Encouraged good sources of protein     MONITORING, EVALUATION, GOAL: Patient will consume adequate calorie and protein to maintain lean muscle mass during treatment   NEXT VISIT: phone f/u Sept 18  Eidan Muellner B. Zenia Resides, [place], [place] Registered Dietitian [phone] (pager)

## 2018-09-14 ENCOUNTER — Inpatient Hospital Stay (HOSPITAL_COMMUNITY): Payer: Medicaid Other

## 2018-09-14 ENCOUNTER — Telehealth (HOSPITAL_COMMUNITY): Payer: Self-pay | Admitting: Hematology

## 2018-09-14 ENCOUNTER — Other Ambulatory Visit: Payer: Self-pay

## 2018-09-14 ENCOUNTER — Encounter (HOSPITAL_COMMUNITY): Payer: Self-pay | Admitting: Hematology

## 2018-09-14 ENCOUNTER — Inpatient Hospital Stay (HOSPITAL_BASED_OUTPATIENT_CLINIC_OR_DEPARTMENT_OTHER): Payer: Medicaid Other | Admitting: Hematology

## 2018-09-14 VITALS — BP 111/83 | HR 97 | Temp 97.7°F | Resp 16 | Wt 158.0 lb

## 2018-09-14 VITALS — BP 101/57 | HR 83 | Temp 97.1°F | Resp 18

## 2018-09-14 DIAGNOSIS — C561 Malignant neoplasm of right ovary: Secondary | ICD-10-CM | POA: Diagnosis not present

## 2018-09-14 DIAGNOSIS — Z5111 Encounter for antineoplastic chemotherapy: Secondary | ICD-10-CM | POA: Diagnosis not present

## 2018-09-14 DIAGNOSIS — C562 Malignant neoplasm of left ovary: Secondary | ICD-10-CM | POA: Diagnosis not present

## 2018-09-14 DIAGNOSIS — Z95828 Presence of other vascular implants and grafts: Secondary | ICD-10-CM

## 2018-09-14 DIAGNOSIS — C563 Malignant neoplasm of bilateral ovaries: Secondary | ICD-10-CM

## 2018-09-14 DIAGNOSIS — K5900 Constipation, unspecified: Secondary | ICD-10-CM

## 2018-09-14 LAB — COMPREHENSIVE METABOLIC PANEL
ALT: 193 U/L — ABNORMAL HIGH (ref 0–44)
AST: 113 U/L — ABNORMAL HIGH (ref 15–41)
Albumin: 3.8 g/dL (ref 3.5–5.0)
Alkaline Phosphatase: 180 U/L — ABNORMAL HIGH (ref 38–126)
Anion gap: 8 (ref 5–15)
BUN: 14 mg/dL (ref 6–20)
CO2: 25 mmol/L (ref 22–32)
Calcium: 9.5 mg/dL (ref 8.9–10.3)
Chloride: 101 mmol/L (ref 98–111)
Creatinine, Ser: 0.57 mg/dL (ref 0.44–1.00)
GFR calc Af Amer: >60 mL/min (ref >60)
GFR calc non Af Amer: >60 mL/min (ref >60)
Glucose, Bld: 111 mg/dL — ABNORMAL HIGH (ref 70–99)
Potassium: 4.1 mmol/L (ref 3.5–5.1)
Sodium: 134 mmol/L — ABNORMAL LOW (ref 135–145)
Total Bilirubin: 0.7 mg/dL (ref 0.3–1.2)
Total Protein: 7.4 g/dL (ref 6.5–8.1)

## 2018-09-14 LAB — CBC WITH DIFFERENTIAL/PLATELET
Abs Immature Granulocytes: 0.07 10*3/uL (ref 0.00–0.07)
Basophils Absolute: 0.1 10*3/uL (ref 0.0–0.1)
Basophils Relative: 1 %
Eosinophils Absolute: 0.1 10*3/uL (ref 0.0–0.5)
Eosinophils Relative: 1 %
HCT: 45.3 % (ref 36.0–46.0)
Hemoglobin: 14.3 g/dL (ref 12.0–15.0)
Immature Granulocytes: 1 %
Lymphocytes Relative: 25 %
Lymphs Abs: 2.2 10*3/uL (ref 0.7–4.0)
MCH: 30.1 pg (ref 26.0–34.0)
MCHC: 31.6 g/dL (ref 30.0–36.0)
MCV: 95.4 fL (ref 80.0–100.0)
Monocytes Absolute: 0.8 10*3/uL (ref 0.1–1.0)
Monocytes Relative: 9 %
Neutro Abs: 5.6 10*3/uL (ref 1.7–7.7)
Neutrophils Relative %: 63 %
Platelets: 223 10*3/uL (ref 150–400)
RBC: 4.75 MIL/uL (ref 3.87–5.11)
RDW: 18.6 % — ABNORMAL HIGH (ref 11.5–15.5)
WBC: 8.8 10*3/uL (ref 4.0–10.5)
nRBC: 0 % (ref 0.0–0.2)

## 2018-09-14 MED ORDER — HEPARIN SOD (PORK) LOCK FLUSH 100 UNIT/ML IV SOLN
500.0000 [IU] | Freq: Once | INTRAVENOUS | Status: AC | PRN
  Administered 2018-09-14: 500 [IU]

## 2018-09-14 MED ORDER — SODIUM CHLORIDE 0.9% FLUSH
10.0000 mL | INTRAVENOUS | Status: DC | PRN
  Administered 2018-09-14: 10 mL
  Filled 2018-09-14: qty 10

## 2018-09-14 MED ORDER — SODIUM CHLORIDE 0.9 % IV SOLN
Freq: Once | INTRAVENOUS | Status: AC
  Administered 2018-09-14: 09:00:00 via INTRAVENOUS

## 2018-09-14 MED ORDER — SODIUM CHLORIDE 0.9 % IV SOLN
175.0000 mg/m2 | Freq: Once | INTRAVENOUS | Status: AC
  Administered 2018-09-14: 324 mg via INTRAVENOUS
  Filled 2018-09-14: qty 54

## 2018-09-14 MED ORDER — PALONOSETRON HCL INJECTION 0.25 MG/5ML
0.2500 mg | Freq: Once | INTRAVENOUS | Status: AC
  Administered 2018-09-14: 0.25 mg via INTRAVENOUS
  Filled 2018-09-14: qty 5

## 2018-09-14 MED ORDER — DIPHENHYDRAMINE HCL 50 MG/ML IJ SOLN
50.0000 mg | Freq: Once | INTRAMUSCULAR | Status: AC
  Administered 2018-09-14: 50 mg via INTRAVENOUS
  Filled 2018-09-14: qty 1

## 2018-09-14 MED ORDER — SODIUM CHLORIDE 0.9 % IV SOLN
Freq: Once | INTRAVENOUS | Status: AC
  Administered 2018-09-14: 10:00:00 via INTRAVENOUS
  Filled 2018-09-14: qty 5

## 2018-09-14 MED ORDER — SODIUM CHLORIDE 0.9 % IV SOLN
736.8000 mg | Freq: Once | INTRAVENOUS | Status: AC
  Administered 2018-09-14: 740 mg via INTRAVENOUS
  Filled 2018-09-14: qty 74

## 2018-09-14 MED ORDER — FAMOTIDINE IN NACL 20-0.9 MG/50ML-% IV SOLN
20.0000 mg | Freq: Once | INTRAVENOUS | Status: AC
  Administered 2018-09-14: 20 mg via INTRAVENOUS
  Filled 2018-09-14: qty 50

## 2018-09-14 MED ORDER — POLYETHYLENE GLYCOL 1000 POWD: 17.0000 g | Freq: Every day | 1 refills | Status: DC

## 2018-09-14 NOTE — Progress Notes (Signed)
09/14/18  Confirmed OK to treat with LFT's - AST 113 and ALT 193 with current doses of Taxol and Carboplatin.  T.O. Dr Beckey Downing LPN/Akemi Overholser Ronnald Ramp, PharmD

## 2018-09-14 NOTE — Patient Instructions (Signed)
[facility] at [facility] Discharge Instructions  You were seen today by Dr. Katragadda. He went over your recent lab results. He will see you back in 3 weeks for labs and follow up.   Thank you for choosing [facility] at [facility] to provide your oncology and hematology care.  To afford each patient quality time with our provider, please arrive at least 15 minutes before your scheduled appointment time.   If you have a lab appointment with the Cancer Center please come in thru the  Main Entrance and check in at the main information desk  You need to re-schedule your appointment should you arrive 10 or more minutes late.  We strive to give you quality time with our providers, and arriving late affects you and other patients whose appointments are after yours.  Also, if you no show three or more times for appointments you may be dismissed from the clinic at the providers discretion.     Again, thank you for choosing [facility].  Our hope is that these requests will decrease the amount of time that you wait before being seen by our physicians.       _____________________________________________________________  Should you have questions after your visit to [facility], please contact our office at [phone] between the hours of 8:00 a.m. and 4:30 p.m.  Voicemails left after 4:00 p.m. will not be returned until the following business day.  For prescription refill requests, have your pharmacy contact our office and allow 72 hours.    Cancer Center Support Programs:   > Cancer Support Group  2nd Tuesday of the month 1pm-2pm, Journey Room    

## 2018-09-14 NOTE — Progress Notes (Signed)
Michelle Holt, Michelle Holt [id number]   CLINIC:  Medical Oncology/Hematology  PCP:  Michelle Chroman, MD [place] [address] [phone]   REASON FOR VISIT:  Follow-up for ovarian cancer, splenic hematoma.   BRIEF ONCOLOGIC HISTORY:  Oncology History  Ovarian cancer, bilateral ([place])  07/07/2018 Pathology Results   PLEURAL FLUID, LEFT (SPECIMEN 1 OF 1, COLLECTED 07/07/18): - MALIGNANT CELLS CONSISTENT WITH ADENOCARCINOMA - SEE COMMENT  Source Pleural Fluid, (Specimen 1 of 1, collected on 07/07/2018) Gross Specimen: Received is/are 1000cc of bloody red fluid with tissue. (TC:tc) Prepared: # Smears: 0 # Concentration Technique Slides (i.e. ThinPrep): 1 # Cell Block: 1 Conventional Additional Studies: Two Hematology slides labeled [id number] Comment The malignant cells are positive for cytokeratin 7, p53, WT-1, Pax-8, Moc31, ER (weak) and EMA but negative for cytokeratin 20, TTF-1, GATA-3, CDX-2 and D2-40. Overall, the phenotype is consistent with a gynecologic primary; clinical correlation recommended.   07/07/2018 Procedure   Successful ultrasound guided left thoracentesis yielding 2.0 L of pleural fluid   07/08/2018 Procedure   1. Technically successful placement of left 14 French pigtail chest drain, placed to Pleur-evac water-seal.   07/08/2018 Procedure   1. Technically successful five [place] double lumen power injectable PICC placement   07/09/2018 Imaging   Ct chest 1. There is a moderate, loculated left hydropneumothorax with a small air component and moderate fluid component. The largest loculated component is located posteriorly. There is a pigtail drainage catheter about the lateral pleural space. There is no obvious etiology, such as obvious mass or pleural disease.   2. There is a small right pleural effusion with associated atelectasis or consolidation and a subpleural consolidation of the superior segment right lower lobe (series  4, image 56), of uncertain significance, possibly infectious or inflammatory   07/10/2018 Imaging   Ct abdomen and pelvis: 1. The bilateral ovaries are enlarged by heterogeneous appearing cystic lesions, measuring 5.3 x 4.2 cm on the right (series 4, image 72) and 4.5 x 3.2 cm on the left (series 4, image 75). Consider dedicated pelvic ultrasound and/or pelvic MRI to further evaluate for solid components given high suspicion for GYN primary malignancy.   2. No other evidence of mass and no lymphadenopathy in the abdomen or pelvis.   3. Trace ascites. There is some suggestion of omental and peritoneal nodularity (e.g. Series 4, image 40), concerning for peritoneal metastatic disease.    4. Loculated left-sided pleural effusion with left-sided pleural drainage catheter in position. Small right pleural effusion   07/13/2018 Surgery   OPERATION: 1.  Left VATS (video-assisted thoracoscopic surgery) for drainage of loculated pleural effusion. 2.  Talc pleurodesis for malignant pleural effusion. 3.  Placement of PleurX catheter for management of malignant pleural effusion. 4.  Placement of On-Q analgesia catheter system.    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   07/13/2018 Pathology Results   Pleura, peel, Left Pleural - FIBRO-FIBRINOUS PLEURITIS - NEGATIVE FOR MALIGNANCY   07/18/2018 Initial Diagnosis   Ovarian cancer, bilateral ([place])   07/20/2018 Procedure   EGD impression: Normal proximal esophagus and mid esophagus. Mild distal esophageal rings; dilation not performed because of esophagitis. LA Grade C reflux esophagitis. Z-line regular, 30 cm from the incisors. 5 cm hiatal hernia. Non-bleeding gastric ulcer with no stigmata of bleeding. Gastritis.  Duodenal erosions without bleeding. Normal  second portion of the duodenum. No specimens collected.   08/19/2018 Genetic Testing   Negative genetic testing on the common hereditary cancer panel.  The Common Hereditary Gene Panel offered by [facility] includes sequencing and/or deletion duplication testing of the following 48 genes: APC, ATM, AXIN2, BARD1, BMPR1A, BRCA1, BRCA2, BRIP1, CDH1, CDK4, CDKN2A (p14ARF), CDKN2A (p16INK4a), CHEK2, CTNNA1, DICER1, EPCAM (Deletion/duplication testing only), GREM1 (promoter region deletion/duplication testing only), KIT, MEN1, MLH1, MSH2, MSH3, MSH6, MUTYH, NBN, NF1, NHTL1, PALB2, PDGFRA, PMS2, POLD1, POLE, PTEN, RAD50, RAD51C, RAD51D, RNF43, SDHB, SDHC, SDHD, SMAD4, SMARCA4. STK11, TP53, TSC1, TSC2, and VHL.  The following genes were evaluated for sequence changes only: SDHA and HOXB13 c.251G>A variant only. The report date is August 19, 2018.   08/24/2018 -  Chemotherapy   The patient had palonosetron (ALOXI) injection 0.25 mg, 0.25 mg, Intravenous,  Once, 2 of 6 cycles Administration: 0.25 mg (08/24/2018), 0.25 mg (09/14/2018) CARBOplatin (PARAPLATIN) 740 mg in sodium chloride 0.9 % 250 mL chemo infusion, 740 mg (100 % of original dose 742.8 mg), Intravenous,  Once, 2 of 6 cycles Dose modification:   (original dose 742.8 mg, Cycle 1),   (original dose 736.8 mg, Cycle 2) Administration: 740 mg (08/24/2018), 740 mg (09/14/2018) PACLitaxel (TAXOL) 324 mg in sodium chloride 0.9 % 500 mL chemo infusion (> '80mg'$ /m2), 175 mg/m2 = 324 mg, Intravenous,  Once, 2 of 6 cycles Administration: 324 mg (08/24/2018), 324 mg (09/14/2018) fosaprepitant (EMEND) 150 mg, dexamethasone (DECADRON) 12 mg in sodium chloride 0.9 % 145 mL IVPB, , Intravenous,  Once, 2 of 6 cycles Administration:  (08/24/2018),  (09/14/2018)  for chemotherapy treatment.       CANCER STAGING: Cancer Staging No matching staging information was found for the patient.   INTERVAL HISTORY:  [name] 48 y.o. female seen for follow-up of ovarian cancer.   She received cycle 1 3 weeks ago.  She tolerated it very well.  She did have nausea yesterday.  Zofran helps.  She has some constipation and requests refill for MiraLAX.  Appetite is 100%.  Energy level 75%.  Pain in the upper abdomen reported as 6 out of 10.  She is taking oxycodone for the left chest wall pain which is helping.  Denies any fevers or infections.  REVIEW OF SYSTEMS:  Review of Systems  Gastrointestinal: Positive for constipation and nausea.  Neurological: Positive for headaches.  All other systems reviewed and are negative.    PAST MEDICAL/SURGICAL HISTORY:  Past Medical History:  Diagnosis Date  . Anxiety and depression   . Arthritis of facet joints at multiple vertebral levels    L5-S1  . Constipation   . Dyslipidemia   . Family history of breast cancer   . Family history of uterine cancer   . Insomnia   . Irritable bowel syndrome   . Migraine   . Muscle tension headache   . Ovarian carcinoma ([place])   . Plantar fasciitis of right foot   . Port-A-Cath in place 08/20/2018   Past Surgical History:  Procedure Laterality Date  . CHOLECYSTECTOMY  2008  . COLONOSCOPY N/A 08/13/2013   Procedure: COLONOSCOPY;  Surgeon: Rogene Houston, MD;  [facility];  Service: Endoscopy;  Laterality: N/A;  230-moved to 145 Ann to notify pt  . ESOPHAGOGASTRODUODENOSCOPY    . ESOPHAGOGASTRODUODENOSCOPY (EGD) WITH PROPOFOL N/A 07/20/2018   Procedure: ESOPHAGOGASTRODUODENOSCOPY (EGD) WITH PROPOFOL;  Surgeon: Rogene Houston, MD;  [facility];  Service: Endoscopy;  Laterality: N/A;  Possible esophageal dilation.  . IR ANGIOGRAM SELECTIVE EACH ADDITIONAL VESSEL  08/01/2018  . IR ANGIOGRAM VISCERAL SELECTIVE  08/01/2018  . IR EMBO ART  VEN HEMORR LYMPH EXTRAV  INC GUIDE ROADMAPPING  08/01/2018  . IR IMAGING GUIDED PORT INSERTION  08/20/2018  . IR PERC PLEURAL DRAIN W/INDWELL CATH W/IMG GUIDE  07/08/2018  . IR THORACENTESIS ASP PLEURAL SPACE W/IMG GUIDE  07/07/2018  . IR US  GUIDE VASC ACCESS RIGHT  08/01/2018  . PLEURAL EFFUSION DRAINAGE Left 07/13/2018   Procedure: DRAINAGE OF LOCULATED PLEURAL EFFUSION;  Surgeon: Ivin Poot, MD;  Location: [place];  Service: Thoracic;  Laterality: Left;  . REMOVAL OF PLEURAL DRAINAGE CATHETER Left 08/20/2018   Procedure: REMOVAL OF PLEURAL DRAINAGE CATHETER;  Surgeon: Ivin Poot, MD;  Location: [place];  Service: Thoracic;  Laterality: Left;  . REMOVAL OF PLEURAL DRAINAGE CATHETER Left 08/20/2018   Procedure: REMOVAL OF PLEURAL DRAINAGE CATHETER;  Surgeon: Ivin Poot, MD;  Location: [place];  Service: Thoracic;  Laterality: Left;  . TALC PLEURODESIS Left 07/13/2018   Procedure: Talc Pleuradesis;  Surgeon: Prescott Gum, Collier Salina, MD;  Location: [place];  Service: Thoracic;  Laterality: Left;  . TUBAL LIGATION Bilateral   . UTERINE ABLATION    . VIDEO ASSISTED THORACOSCOPY Left 07/13/2018   Procedure: VIDEO ASSISTED THORACOSCOPY;  Surgeon: Ivin Poot, MD;  Location: [facility] OR;  Service: Thoracic;  Laterality: Left;     SOCIAL HISTORY:  Social History   Socioeconomic History  . Marital status: Widowed    Spouse name: Not on file  . Number of children: 2  . Years of education: 2-College  . Highest education level: Not on file  Occupational History    Employer: [employer]  . Financial resource strain: Not hard at all  . Food insecurity    Worry: Never true    Inability: Never true  . Transportation needs    Medical: No    Non-medical: No  Tobacco Use  . Smoking status: Former Smoker    Packs/day: 0.50    Years: 17.00    Pack years: 8.50    Types: Cigarettes    Quit date: 06/22/2018    Years since quitting: 0.2  . Smokeless tobacco: Never Used  Substance and Sexual Activity  . Alcohol use: Yes    Alcohol/week: 1.0 standard drinks    Types: 1 Glasses of wine per week    Comment: Drinks alcohol on occasion  . Drug use: No  . Sexual activity: Not on file  Lifestyle  . Physical activity    Days per  week: 0 days    Minutes per session: 0 min  . Stress: Very much  Relationships  . Social connections    Talks on phone: More than three times a week    Gets together: More than three times a week    Attends religious service: 1 to 4 times per year    Active member of club or organization: No    Attends meetings of clubs or organizations: Never    Relationship status: Widowed  . Intimate partner violence    Fear of current or ex partner: No    Emotionally abused: No    Physically abused: No    Forced sexual activity: No  Other Topics Concern  . Not on file  Social History Narrative   Patient lives at home with her daughter.    Patient has 2 children.  Patient is widowed.    Patient is right handed.    Patient has her Associates degree.       FAMILY HISTORY:  Family History  Problem Relation Age of Onset  . Hypertension Mother   . Obesity Mother   . Diabetes Mother   . Kidney disease Mother   . Peripheral vascular disease Father   . Atrial fibrillation Father   . COPD Brother   . Osteoporosis Brother   . Crohn's disease Sister   . Uterine cancer Sister 17       maternal half sister  . Breast cancer Maternal Aunt 3  . Colon cancer Neg Hx     CURRENT MEDICATIONS:  Outpatient Encounter Medications as of 09/14/2018  Medication Sig  . CARBOPLATIN IV Inject into the vein every 21 ( twenty-one) days.  Marland Kitchen dexamethasone (DECADRON) 4 MG tablet Take 1 tablet (4 mg total) by mouth daily.  . diazepam (VALIUM) 5 MG tablet TAKE ONE TABLET BY MOUTH AT BEDTIME AS NEEDED FOR ANXIETY  . dicyclomine (BENTYL) 10 MG capsule Take 1 capsule (10 mg total) by mouth 4 (four) times daily -  before meals and at bedtime.  . docusate sodium (COLACE) 100 MG capsule Take 1 capsule (100 mg total) by mouth 2 (two) times daily.  Marland Kitchen lidocaine-prilocaine (EMLA) cream Apply small amount to port a cath site and cover with plastic wrap one hour prior to chemotherapy appointments  . ondansetron (ZOFRAN  ODT) 8 MG disintegrating tablet Take 1 tablet (8 mg total) by mouth every 8 (eight) hours as needed for nausea or vomiting.  . Oxycodone HCl 10 MG TABS Take 1 tablet (10 mg total) by mouth every 6 (six) hours as needed.  Marland Kitchen PACLITAXEL IV Inject into the vein every 21 ( twenty-one) days.  . polyethylene glycol (MIRALAX / GLYCOLAX) 17 g packet Take 17 g by mouth as needed.  . SUMAtriptan (IMITREX) 100 MG tablet TAKE ONE TABLET BY MOUTH PRN UP to TWICE DAILY AS NEEDED.  . benzonatate (TESSALON PERLES) 100 MG capsule Take 1 capsule (100 mg total) by mouth 3 (three) times daily as needed for cough. (Patient not taking: Reported on 09/14/2018)  . gabapentin (NEURONTIN) 300 MG capsule Take 1 capsule (300 mg total) by mouth 3 (three) times daily. (Patient not taking: Reported on 09/14/2018)  . HYDROmorphone (DILAUDID) 4 MG tablet Take 1 tablet (4 mg total) by mouth every 4 (four) hours as needed for severe pain. (Patient not taking: Reported on 09/14/2018)  . ibuprofen (ADVIL) 600 MG tablet Take 600 mg by mouth every 6 (six) hours as needed.  . pantoprazole (PROTONIX) 40 MG tablet Take 1 tablet (40 mg total) by mouth 2 (two) times daily before a meal.  . Polyethylene Glycol 1000 POWD Take 17 g by mouth daily.  . prochlorperazine (COMPAZINE) 10 MG tablet Take 1 tablet (10 mg total) by mouth every 6 (six) hours as needed (Nausea or vomiting). (Patient not taking: Reported on 09/14/2018)  . promethazine (PHENERGAN) 25 MG tablet Take 1 tablet (25 mg total) by mouth every 6 (six) hours as needed for nausea, vomiting or refractory nausea / vomiting. (Patient not taking: Reported on 09/14/2018)   No facility-administered encounter medications on file as of 09/14/2018.     ALLERGIES:  Allergies  Allergen Reactions  . Morphine And Related Itching  . Nortriptyline Other (See Comments)    Significant weight gain  . Topamax [Topiramate] Diarrhea    nausea  . Actifed Cold-Allergy [Chlorpheniramine-Phenyleph Er]  Rash   . Amoxicillin Rash    Did it involve swelling of the face/tongue/throat, SOB, or low BP? Unknown Did it involve sudden or severe rash/hives, skin peeling, or any reaction on the inside of your mouth or nose? Unknown Did you need to seek medical attention at a hospital or doctor's office? Unknown When did it last happen? teenager If all above answers are "NO", may proceed with cephalosporin use.   . Codeine Hives  . Erythromycin Rash  . Penicillins Rash    Did it involve swelling of the face/tongue/throat, SOB, or low BP? Unknown Did it involve sudden or severe rash/hives, skin peeling, or any reaction on the inside of your mouth or nose? Unknown Did you need to seek medical attention at a hospital or doctor's office? Unknown When did it last happen? teenager If all above answers are "NO", may proceed with cephalosporin use.   [name] [Pseudoephedrine Hcl] Rash     PHYSICAL EXAM:  ECOG Performance status: 1  Vitals:   09/14/18 0826  BP: 111/83  Pulse: 97  Resp: 16  Temp: 97.7 F (36.5 C)  SpO2: 100%   Filed Weights   09/14/18 0826  Weight: 158 lb (71.7 kg)    Physical Exam Vitals signs reviewed.  Constitutional:      Appearance: Normal appearance.  Cardiovascular:     Rate and Rhythm: Normal rate and regular rhythm.     Heart sounds: Normal heart sounds.  Pulmonary:     Effort: Pulmonary effort is normal.     Breath sounds: Normal breath sounds.     Comments: Decreased breath sounds at left lung base.  Left Pleurx catheter present. Abdominal:     General: There is no distension.     Palpations: Abdomen is soft. There is no mass.  Skin:    General: Skin is warm.  Neurological:     General: No focal deficit present.     Mental Status: She is alert and oriented to person, place, and time.  Psychiatric:        Mood and Affect: Mood normal.        Behavior: Behavior normal.      LABORATORY DATA:  I have reviewed the labs as listed.  CBC     Component Value Date/Time   WBC 8.8 09/14/2018 0829   RBC 4.75 09/14/2018 0829   HGB 14.3 09/14/2018 0829   HCT 45.3 09/14/2018 0829   PLT 223 09/14/2018 0829   MCV 95.4 09/14/2018 0829   MCH 30.1 09/14/2018 0829   MCHC 31.6 09/14/2018 0829   RDW 18.6 (H) 09/14/2018 0829   LYMPHSABS 2.2 09/14/2018 0829   MONOABS 0.8 09/14/2018 0829   EOSABS 0.1 09/14/2018 0829   BASOSABS 0.1 09/14/2018 0829   CMP Latest Ref Rng & Units 09/14/2018 09/08/2018 08/31/2018  Glucose 70 - 99 mg/dL 111(H) 99 113(H)  BUN 6 - 20 mg/dL '14 14 14  '$ Creatinine 0.44 - 1.00 mg/dL 0.57 0.72 0.66  Sodium 135 - 145 mmol/L 134(L) 137 136  Potassium 3.5 - 5.1 mmol/L 4.1 3.9 4.2  Chloride 98 - 111 mmol/L 101 100 99  CO2 22 - 32 mmol/L '25 29 25  '$ Calcium 8.9 - 10.3 mg/dL 9.5 9.4 10.0  Total Protein 6.5 - 8.1 g/dL 7.4 7.0 8.1  Total Bilirubin 0.3 - 1.2 mg/dL 0.7 0.4 0.9  Alkaline Phos 38 - 126 U/L 180(H) 77 106  AST 15 - 41 U/L 113(H) 21 33  ALT 0 - 44 U/L  193(H) 25 34       DIAGNOSTIC IMAGING:  I have independently reviewed the scans and discussed with the patient.   I have reviewed Venita Lick LPN's note and agree with the documentation.  I personally performed a face-to-face visit, made revisions and my assessment and plan is as follows.    ASSESSMENT & PLAN:   Ovarian cancer, bilateral ([place]) 1.  Clinical stage IVa ovarian cancer, positive cytology of left pleural effusion: - Cycle 1 of carboplatin and paclitaxel on 08/24/2018. -Germline mutation testing is negative. - She had one episode of nausea yesterday.  Otherwise she tolerated first cycle very well. - We reviewed her blood work today.  She will proceed with cycle 2 without any dose changes. - Her LFTs including AST and ALT are elevated.  She reported some epigastric discomfort.  She does not have a gallbladder.  We will follow them next week. -Otherwise she will see [place] back in 3 weeks for follow-up.  2.  Left chest wall pain: -Left Pleurx  catheter discontinued on 08/20/2018. - She is taking oxycodone 10 mg every 6 hours as needed.  3.  Nausea/vomiting: -She takes Zofran ODT 8 mg every 8 hours as needed. -He also takes dexamethasone 4 mg for 1 week after each treatment.  4.  Splenic hematoma: -Ligation of the splenic artery on 08/01/2018.  CT CAP on 08/21/2018 showed stable hematoma. -Hemoglobin is stable.   Total time spent is 25 minutes with more than 50% of the time spent face-to-face discussing treatment plan, counseling and coordination of care.  Orders placed this encounter:  No orders of the defined types were placed in this encounter.     Derek Jack, MD [place] [phone]  \

## 2018-09-14 NOTE — Assessment & Plan Note (Signed)
1.  Clinical stage IVa ovarian cancer, positive cytology of left pleural effusion: - Cycle 1 of carboplatin and paclitaxel on 08/24/2018. -Germline mutation testing is negative. - She had one episode of nausea yesterday.  Otherwise she tolerated first cycle very well. - We reviewed her blood work today.  She will proceed with cycle 2 without any dose changes. - Her LFTs including AST and ALT are elevated.  She reported some epigastric discomfort.  She does not have a gallbladder.  We will follow them next week. -Otherwise she will see [place] back in 3 weeks for follow-up.  2.  Left chest wall pain: -Left Pleurx catheter discontinued on 08/20/2018. - She is taking oxycodone 10 mg every 6 hours as needed.  3.  Nausea/vomiting: -She takes Zofran ODT 8 mg every 8 hours as needed. -He also takes dexamethasone 4 mg for 1 week after each treatment.  4.  Splenic hematoma: -Ligation of the splenic artery on 08/01/2018.  CT CAP on 08/21/2018 showed stable hematoma. -Hemoglobin is stable.

## 2018-09-14 NOTE — Progress Notes (Signed)
Labs reviewed with MD today at office visit, proceed with treatment.   Treatment given per orders. Patient tolerated it well without problems. Vitals stable and discharged home from clinic ambulatory. Follow up as scheduled.

## 2018-09-14 NOTE — Telephone Encounter (Signed)
PT APPROVED FOR [employer] GRANT IN THE AMT OF $ 1000.00  EFFT DATE 09/08/18

## 2018-09-14 NOTE — Patient Instructions (Signed)
[facility] Discharge Instructions for Patients Receiving Chemotherapy  Today you received the following chemotherapy agents   To help prevent nausea and vomiting after your treatment, we encourage you to take your nausea medication   If you develop nausea and vomiting that is not controlled by your nausea medication, call the clinic.   BELOW ARE SYMPTOMS THAT SHOULD BE REPORTED IMMEDIATELY:  *FEVER GREATER THAN 100.5 F  *CHILLS WITH OR WITHOUT FEVER  NAUSEA AND VOMITING THAT IS NOT CONTROLLED WITH YOUR NAUSEA MEDICATION  *UNUSUAL SHORTNESS OF BREATH  *UNUSUAL BRUISING OR BLEEDING  TENDERNESS IN MOUTH AND THROAT WITH OR WITHOUT PRESENCE OF ULCERS  *URINARY PROBLEMS  *BOWEL PROBLEMS  UNUSUAL RASH Items with * indicate a potential emergency and should be followed up as soon as possible.  Feel free to call the clinic should you have any questions or concerns. The clinic phone number is [phone].  Please show the CHEMO ALERT CARD at check-in to the Emergency Department and triage nurse.   

## 2018-09-17 ENCOUNTER — Telehealth: Payer: Self-pay | Admitting: Oncology

## 2018-09-17 NOTE — Telephone Encounter (Signed)
Michelle Holt with appointment to see Dr. Denman George on 10/19/18 at 2:30.  She verbalized understanding and agreement.

## 2018-09-18 ENCOUNTER — Encounter: Payer: Self-pay | Admitting: General Practice

## 2018-09-18 ENCOUNTER — Telehealth (HOSPITAL_COMMUNITY): Payer: Self-pay | Admitting: *Deleted

## 2018-09-18 ENCOUNTER — Other Ambulatory Visit (HOSPITAL_COMMUNITY): Payer: Self-pay | Admitting: Hematology

## 2018-09-18 DIAGNOSIS — Z515 Encounter for palliative care: Secondary | ICD-10-CM | POA: Insufficient documentation

## 2018-09-18 MED ORDER — HYDROMORPHONE HCL 2 MG PO TABS: 2.0000 mg | ORAL_TABLET | ORAL | 0 refills | Status: DC | PRN

## 2018-09-18 NOTE — Progress Notes (Signed)
[place] CSW Progress Notes  Call from [facility], patients application for [place] was filed on 08/17/18.  Call from [facility] [phone]), they are working w [place] on patient's Medicaid application, this is in process.  Edwyna Shell, LCSW Clinical Social Worker Phone:  [phone]

## 2018-09-18 NOTE — Progress Notes (Signed)
[facility] CSW Progress Notes  Request from genetics counselor Roma Kayser to determine status of patient's Medicaid and disability applications.  Left VMs left for Tito Dine at [employer] and for [employer] (agency assisting w [place] application).    Edwyna Shell, LCSW Clinical Social Worker Phone:  [phone]

## 2018-09-18 NOTE — Telephone Encounter (Signed)
Pt called the clinic reporting severe, diffuse abdominal cramping - per pt it feels like "really bad menstrual cramps".  She states she is taking her Bentyl 10 mg qid and oxycodone 10 mg every 4 hours (prescribed for every 6 hours) with little relief.  States she has also been sleeping in the fetal position with a heating pad to try to help ease the pain.  She denies any N/V/D or constipation. She was on Dilaudid for pain at one point but says she told Dr. Delton Coombes she didn't think she needed that much pain medication so she was switched back to oxycodone and is not currently taking the Dilaudid. The above was discussed with Krystal Eaton, NP.  She will call in a prescription for Dilaudid 4 mg every 4 hours as needed.  Per NP, pt may alternate between oxycodone 10 mg and Dilaudid 4 mg every two hours.  NP also advises that if pt's pain is not significantly better 2 hours after taking her first Dilaudid dose that she should report to the ED for further evaluation and work up.  Pt notified of all of the above - she verbalizes understanding of how to take the pain medication and agrees to report to the ED if her pain is not improved after 2 hours of her first dose of Dilaudid.

## 2018-09-22 ENCOUNTER — Other Ambulatory Visit: Payer: Self-pay

## 2018-09-22 ENCOUNTER — Other Ambulatory Visit (HOSPITAL_COMMUNITY): Payer: Self-pay | Admitting: Hematology

## 2018-09-22 ENCOUNTER — Inpatient Hospital Stay (HOSPITAL_COMMUNITY): Payer: Medicaid Other | Attending: Hematology

## 2018-09-22 ENCOUNTER — Other Ambulatory Visit (HOSPITAL_COMMUNITY): Payer: Self-pay | Admitting: *Deleted

## 2018-09-22 ENCOUNTER — Encounter (HOSPITAL_COMMUNITY): Payer: Self-pay | Admitting: *Deleted

## 2018-09-22 DIAGNOSIS — Z87891 Personal history of nicotine dependence: Secondary | ICD-10-CM | POA: Insufficient documentation

## 2018-09-22 DIAGNOSIS — Z8049 Family history of malignant neoplasm of other genital organs: Secondary | ICD-10-CM | POA: Diagnosis not present

## 2018-09-22 DIAGNOSIS — C561 Malignant neoplasm of right ovary: Secondary | ICD-10-CM | POA: Insufficient documentation

## 2018-09-22 DIAGNOSIS — C563 Malignant neoplasm of bilateral ovaries: Secondary | ICD-10-CM

## 2018-09-22 DIAGNOSIS — Z803 Family history of malignant neoplasm of breast: Secondary | ICD-10-CM | POA: Insufficient documentation

## 2018-09-22 DIAGNOSIS — C562 Malignant neoplasm of left ovary: Secondary | ICD-10-CM | POA: Diagnosis present

## 2018-09-22 DIAGNOSIS — F419 Anxiety disorder, unspecified: Secondary | ICD-10-CM | POA: Insufficient documentation

## 2018-09-22 DIAGNOSIS — E785 Hyperlipidemia, unspecified: Secondary | ICD-10-CM | POA: Diagnosis not present

## 2018-09-22 DIAGNOSIS — D735 Infarction of spleen: Secondary | ICD-10-CM | POA: Diagnosis not present

## 2018-09-22 DIAGNOSIS — Z79899 Other long term (current) drug therapy: Secondary | ICD-10-CM | POA: Insufficient documentation

## 2018-09-22 DIAGNOSIS — Z5111 Encounter for antineoplastic chemotherapy: Secondary | ICD-10-CM | POA: Diagnosis not present

## 2018-09-22 LAB — CBC WITH DIFFERENTIAL/PLATELET
Abs Immature Granulocytes: 0.01 10*3/uL (ref 0.00–0.07)
Basophils Absolute: 0 10*3/uL (ref 0.0–0.1)
Basophils Relative: 1 %
Eosinophils Absolute: 0 10*3/uL (ref 0.0–0.5)
Eosinophils Relative: 1 %
HCT: 44 % (ref 36.0–46.0)
Hemoglobin: 13.8 g/dL (ref 12.0–15.0)
Immature Granulocytes: 0 %
Lymphocytes Relative: 53 %
Lymphs Abs: 2.1 10*3/uL (ref 0.7–4.0)
MCH: 30.4 pg (ref 26.0–34.0)
MCHC: 31.4 g/dL (ref 30.0–36.0)
MCV: 96.9 fL (ref 80.0–100.0)
Monocytes Absolute: 0.2 10*3/uL (ref 0.1–1.0)
Monocytes Relative: 5 %
Neutro Abs: 1.6 10*3/uL — ABNORMAL LOW (ref 1.7–7.7)
Neutrophils Relative %: 40 %
Platelets: 158 10*3/uL (ref 150–400)
RBC: 4.54 MIL/uL (ref 3.87–5.11)
RDW: 18.3 % — ABNORMAL HIGH (ref 11.5–15.5)
WBC: 3.9 10*3/uL — ABNORMAL LOW (ref 4.0–10.5)
nRBC: 0 % (ref 0.0–0.2)

## 2018-09-22 LAB — COMPREHENSIVE METABOLIC PANEL
ALT: 59 U/L — ABNORMAL HIGH (ref 0–44)
AST: 34 U/L (ref 15–41)
Albumin: 3.9 g/dL (ref 3.5–5.0)
Alkaline Phosphatase: 105 U/L (ref 38–126)
Anion gap: 11 (ref 5–15)
BUN: 17 mg/dL (ref 6–20)
CO2: 27 mmol/L (ref 22–32)
Calcium: 9.3 mg/dL (ref 8.9–10.3)
Chloride: 96 mmol/L — ABNORMAL LOW (ref 98–111)
Creatinine, Ser: 0.65 mg/dL (ref 0.44–1.00)
GFR calc Af Amer: >60 mL/min (ref >60)
GFR calc non Af Amer: >60 mL/min (ref >60)
Glucose, Bld: 91 mg/dL (ref 70–99)
Potassium: 4.4 mmol/L (ref 3.5–5.1)
Sodium: 134 mmol/L — ABNORMAL LOW (ref 135–145)
Total Bilirubin: 0.8 mg/dL (ref 0.3–1.2)
Total Protein: 7.1 g/dL (ref 6.5–8.1)

## 2018-09-22 NOTE — Progress Notes (Signed)
Patient called the clinic reporting pain control with the dilaudid and the oxycodone that was called in late last week.  She reports till having some abdominal cramping but nothing as severe.  She is requesting refills on both of the pain medications.  She also requested that we refill her valium and maybe increase it to twice daily instead of once daily QHS.  I talked to Harriet Pho, NP and she doesn't want to increase her valium at this time. She wants to get and keep the patient's pain under control and then will reassess her anxiety at her next visit.    I called and updated the patient on the above

## 2018-09-23 DIAGNOSIS — Z736 Limitation of activities due to disability: Secondary | ICD-10-CM

## 2018-09-23 MED ORDER — HYDROMORPHONE HCL 2 MG PO TABS: 2.0000 mg | ORAL_TABLET | ORAL | 0 refills | Status: DC | PRN

## 2018-09-23 MED ORDER — DIAZEPAM 5 MG PO TABS: 5.0000 mg | ORAL_TABLET | Freq: Every day | ORAL | 5 refills | Status: DC

## 2018-09-24 ENCOUNTER — Other Ambulatory Visit (HOSPITAL_COMMUNITY): Payer: Self-pay | Admitting: *Deleted

## 2018-09-24 ENCOUNTER — Other Ambulatory Visit (HOSPITAL_COMMUNITY): Payer: Self-pay | Admitting: Hematology

## 2018-09-24 DIAGNOSIS — C561 Malignant neoplasm of right ovary: Secondary | ICD-10-CM

## 2018-09-24 DIAGNOSIS — C563 Malignant neoplasm of bilateral ovaries: Secondary | ICD-10-CM

## 2018-10-02 ENCOUNTER — Ambulatory Visit (HOSPITAL_COMMUNITY): Payer: Medicaid Other

## 2018-10-02 NOTE — Progress Notes (Signed)
Nutrition Follow-up:  Patient with stage IV ovarian cancer with left pleural effusion.  Patient under going neoadjuvant chemotherapy.  Planning to meet with surgeon on 10/5 for consultation.   Spoke with patient via phone.  Patient reports that she is eating well.  Reports that abdominal pain, cramping is better.  She is eating good sources of protein (chicken, beef, fish, eggs, protein shake, diary products).  Reports regular bowel movement, no diarrhea except for 1 episode this cycle.    Denies any nutrition impact symptoms.   Medications: bentyl, dilaudid, zofran  Labs: reviewed  Anthropometrics:   /weight 158 lb on 8/31 stable.    Patient confirms stable weight.    NUTRITION DIAGNOSIS: Inadequate oral intake stable   INTERVENTION:  Encouraged continued good nutrition especially good sources of protein.  Patient has contact information.    MONITORING, EVALUATION, GOAL: Patient will consume adequate calories and protein to maintain lean muscle mass during treatment.   NEXT VISIT: phone f/u October 16   Michelle Holt, [place], [place] Registered Dietitian [phone] (pager)

## 2018-10-05 ENCOUNTER — Encounter (HOSPITAL_COMMUNITY): Payer: Self-pay | Admitting: Hematology

## 2018-10-05 ENCOUNTER — Other Ambulatory Visit (HOSPITAL_COMMUNITY): Payer: Self-pay

## 2018-10-05 ENCOUNTER — Inpatient Hospital Stay (HOSPITAL_COMMUNITY): Payer: Medicaid Other

## 2018-10-05 ENCOUNTER — Inpatient Hospital Stay (HOSPITAL_BASED_OUTPATIENT_CLINIC_OR_DEPARTMENT_OTHER): Payer: Medicaid Other | Admitting: Hematology

## 2018-10-05 ENCOUNTER — Other Ambulatory Visit: Payer: Self-pay

## 2018-10-05 VITALS — BP 100/64 | Resp 86

## 2018-10-05 VITALS — BP 117/75 | HR 101 | Temp 97.5°F | Resp 18 | Wt 163.8 lb

## 2018-10-05 DIAGNOSIS — Z95828 Presence of other vascular implants and grafts: Secondary | ICD-10-CM

## 2018-10-05 DIAGNOSIS — C561 Malignant neoplasm of right ovary: Secondary | ICD-10-CM

## 2018-10-05 DIAGNOSIS — C563 Malignant neoplasm of bilateral ovaries: Secondary | ICD-10-CM

## 2018-10-05 DIAGNOSIS — C562 Malignant neoplasm of left ovary: Secondary | ICD-10-CM | POA: Diagnosis not present

## 2018-10-05 DIAGNOSIS — Z5111 Encounter for antineoplastic chemotherapy: Secondary | ICD-10-CM | POA: Diagnosis not present

## 2018-10-05 LAB — CBC WITH DIFFERENTIAL/PLATELET
Abs Immature Granulocytes: 0.02 10*3/uL (ref 0.00–0.07)
Basophils Absolute: 0 10*3/uL (ref 0.0–0.1)
Basophils Relative: 1 %
Eosinophils Absolute: 0 10*3/uL (ref 0.0–0.5)
Eosinophils Relative: 0 %
HCT: 42.2 % (ref 36.0–46.0)
Hemoglobin: 13.5 g/dL (ref 12.0–15.0)
Immature Granulocytes: 0 %
Lymphocytes Relative: 40 %
Lymphs Abs: 2.4 10*3/uL (ref 0.7–4.0)
MCH: 31.4 pg (ref 26.0–34.0)
MCHC: 32 g/dL (ref 30.0–36.0)
MCV: 98.1 fL (ref 80.0–100.0)
Monocytes Absolute: 0.6 10*3/uL (ref 0.1–1.0)
Monocytes Relative: 10 %
Neutro Abs: 2.9 10*3/uL (ref 1.7–7.7)
Neutrophils Relative %: 49 %
Platelets: 322 10*3/uL (ref 150–400)
RBC: 4.3 MIL/uL (ref 3.87–5.11)
RDW: 20 % — ABNORMAL HIGH (ref 11.5–15.5)
WBC: 5.9 10*3/uL (ref 4.0–10.5)
nRBC: 0 % (ref 0.0–0.2)

## 2018-10-05 LAB — COMPREHENSIVE METABOLIC PANEL
ALT: 24 U/L (ref 0–44)
AST: 18 U/L (ref 15–41)
Albumin: 3.7 g/dL (ref 3.5–5.0)
Alkaline Phosphatase: 122 U/L (ref 38–126)
Anion gap: 9 (ref 5–15)
BUN: 11 mg/dL (ref 6–20)
CO2: 25 mmol/L (ref 22–32)
Calcium: 9.2 mg/dL (ref 8.9–10.3)
Chloride: 105 mmol/L (ref 98–111)
Creatinine, Ser: 0.53 mg/dL (ref 0.44–1.00)
GFR calc Af Amer: >60 mL/min (ref >60)
GFR calc non Af Amer: >60 mL/min (ref >60)
Glucose, Bld: 112 mg/dL — ABNORMAL HIGH (ref 70–99)
Potassium: 3.7 mmol/L (ref 3.5–5.1)
Sodium: 139 mmol/L (ref 135–145)
Total Bilirubin: 0.8 mg/dL (ref 0.3–1.2)
Total Protein: 7.2 g/dL (ref 6.5–8.1)

## 2018-10-05 MED ORDER — HEPARIN SOD (PORK) LOCK FLUSH 100 UNIT/ML IV SOLN
500.0000 [IU] | Freq: Once | INTRAVENOUS | Status: AC | PRN
  Administered 2018-10-05: 15:00:00 500 [IU]

## 2018-10-05 MED ORDER — PALONOSETRON HCL INJECTION 0.25 MG/5ML
0.2500 mg | Freq: Once | INTRAVENOUS | Status: AC
  Administered 2018-10-05: 0.25 mg via INTRAVENOUS
  Filled 2018-10-05: qty 5

## 2018-10-05 MED ORDER — OXYCODONE HCL 10 MG PO TABS: 10.0000 mg | ORAL_TABLET | ORAL | 0 refills | Status: DC | PRN

## 2018-10-05 MED ORDER — SODIUM CHLORIDE 0.9 % IV SOLN
736.8000 mg | Freq: Once | INTRAVENOUS | Status: AC
  Administered 2018-10-05: 740 mg via INTRAVENOUS
  Filled 2018-10-05: qty 74

## 2018-10-05 MED ORDER — SODIUM CHLORIDE 0.9 % IV SOLN
Freq: Once | INTRAVENOUS | Status: AC
  Administered 2018-10-05: 10:00:00 via INTRAVENOUS
  Filled 2018-10-05: qty 5

## 2018-10-05 MED ORDER — SODIUM CHLORIDE 0.9% FLUSH
10.0000 mL | INTRAVENOUS | Status: DC | PRN
  Administered 2018-10-05: 08:00:00 10 mL
  Filled 2018-10-05: qty 10

## 2018-10-05 MED ORDER — DIPHENHYDRAMINE HCL 50 MG/ML IJ SOLN
50.0000 mg | Freq: Once | INTRAMUSCULAR | Status: AC
  Administered 2018-10-05: 50 mg via INTRAVENOUS
  Filled 2018-10-05: qty 1

## 2018-10-05 MED ORDER — FAMOTIDINE IN NACL 20-0.9 MG/50ML-% IV SOLN
20.0000 mg | Freq: Once | INTRAVENOUS | Status: AC
  Administered 2018-10-05: 20 mg via INTRAVENOUS
  Filled 2018-10-05: qty 50

## 2018-10-05 MED ORDER — SODIUM CHLORIDE 0.9 % IV SOLN
175.0000 mg/m2 | Freq: Once | INTRAVENOUS | Status: AC
  Administered 2018-10-05: 11:00:00 324 mg via INTRAVENOUS
  Filled 2018-10-05: qty 54

## 2018-10-05 MED ORDER — SODIUM CHLORIDE 0.9 % IV SOLN
Freq: Once | INTRAVENOUS | Status: AC
  Administered 2018-10-05: 10:00:00 via INTRAVENOUS

## 2018-10-05 NOTE — Progress Notes (Signed)
Labs reviewed with MD today. Proceed with treatment today per MD.   Treatment given per orders. Patient tolerated it well without problems. Vitals stable and discharged home from clinic ambulatory. Follow up as scheduled.  

## 2018-10-05 NOTE — Patient Instructions (Addendum)
[facility] at [facility] Discharge Instructions  You were seen today by Dr. Katragadda. He went over your recent lab results. He will see you back in 3 weeks for labs and follow up.   Thank you for choosing [facility] at [facility] to provide your oncology and hematology care.  To afford each patient quality time with our provider, please arrive at least 15 minutes before your scheduled appointment time.   If you have a lab appointment with the Cancer Center please come in thru the  Main Entrance and check in at the main information desk  You need to re-schedule your appointment should you arrive 10 or more minutes late.  We strive to give you quality time with our providers, and arriving late affects you and other patients whose appointments are after yours.  Also, if you no show three or more times for appointments you may be dismissed from the clinic at the providers discretion.     Again, thank you for choosing [facility].  Our hope is that these requests will decrease the amount of time that you wait before being seen by our physicians.       _____________________________________________________________  Should you have questions after your visit to [facility], please contact our office at [phone] between the hours of 8:00 a.m. and 4:30 p.m.  Voicemails left after 4:00 p.m. will not be returned until the following business day.  For prescription refill requests, have your pharmacy contact our office and allow 72 hours.    Cancer Center Support Programs:   > Cancer Support Group  2nd Tuesday of the month 1pm-2pm, Journey Room    

## 2018-10-05 NOTE — Assessment & Plan Note (Signed)
1.  Clinical stage IVa ovarian cancer, positive cytology of left pleural effusion: -2 cycles of carboplatin and paclitaxel on 08/24/2018 and 09/14/2018. - Germline mutation testing is negative. - She has tolerated her cycle 2 fairly well.  She does report some abdominal cramping mainly in the lower abdomen.  She is taking Bentyl as needed. - We reviewed her blood work.  She will proceed with cycle 3 at the same dose levels. -I plan to repeat CT CAP prior to next visit in 3 weeks.  She has an appointment to see Dr. Denman George in the second week of October.  2.  Left chest wall and abdominal pain: - She is taking oxycodone 10 mg every 6 hours.  She is also taking Dilaudid.  We will discontinue Dilaudid.  She will take oxycodone 10 mg every 4 hours as needed.  I sent a prescription.  3.  Nausea/vomiting: - She is taking Zofran ODT 8 mg every 8 hours.  She also takes dexamethasone 4 mg for 1 week after each treatment.  4.  Splenic hematoma: -Ligation of splenic artery on 08/01/2018.  CT CAP on 08/21/2018 showed stable hematoma. -Hemoglobin has been stable.

## 2018-10-05 NOTE — Progress Notes (Signed)
Michelle Holt, [place] [address]   CLINIC:  Medical Oncology/Hematology  PCP:  [name], MD [place] [address] [phone]   REASON FOR VISIT:  Follow-up for ovarian cancer, splenic hematoma.   BRIEF ONCOLOGIC HISTORY:  Oncology History  Ovarian cancer, bilateral ([place])  07/07/2018 Pathology Results   PLEURAL FLUID, LEFT (SPECIMEN 1 OF 1, COLLECTED 07/07/18): - MALIGNANT CELLS CONSISTENT WITH ADENOCARCINOMA - SEE COMMENT  Source Pleural Fluid, (Specimen 1 of 1, collected on 07/07/2018) Gross Specimen: Received is/are 1000cc of bloody red fluid with tissue. (TC:tc) Prepared: # Smears: 0 # Concentration Technique Slides (i.e. ThinPrep): 1 # Cell Block: 1 Conventional Additional Studies: Two Hematology slides labeled [id number] Comment The malignant cells are positive for cytokeratin 7, p53, WT-1, Pax-8, Moc31, ER (weak) and EMA but negative for cytokeratin 20, TTF-1, GATA-3, CDX-2 and D2-40. Overall, the phenotype is consistent with a gynecologic primary; clinical correlation recommended.   07/07/2018 Procedure   Successful ultrasound guided left thoracentesis yielding 2.0 L of pleural fluid   07/08/2018 Procedure   1. Technically successful placement of left 14 French pigtail chest drain, placed to Pleur-evac water-seal.   07/08/2018 Procedure   1. Technically successful five [place] double lumen power injectable PICC placement   07/09/2018 Imaging   Ct chest 1. There is a moderate, loculated left hydropneumothorax with a small air component and moderate fluid component. The largest loculated component is located posteriorly. There is a pigtail drainage catheter about the lateral pleural space. There is no obvious etiology, such as obvious mass or pleural disease.   2. There is a small right pleural effusion with associated atelectasis or consolidation and a subpleural consolidation of the superior segment right lower lobe (series  4, image 56), of uncertain significance, possibly infectious or inflammatory   07/10/2018 Imaging   Ct abdomen and pelvis: 1. The bilateral ovaries are enlarged by heterogeneous appearing cystic lesions, measuring 5.3 x 4.2 cm on the right (series 4, image 72) and 4.5 x 3.2 cm on the left (series 4, image 75). Consider dedicated pelvic ultrasound and/or pelvic MRI to further evaluate for solid components given high suspicion for GYN primary malignancy.   2. No other evidence of mass and no lymphadenopathy in the abdomen or pelvis.   3. Trace ascites. There is some suggestion of omental and peritoneal nodularity (e.g. Series 4, image 81), concerning for peritoneal metastatic disease.    4. Loculated left-sided pleural effusion with left-sided pleural drainage catheter in position. Small right pleural effusion   07/13/2018 Surgery   OPERATION: 1.  Left VATS (video-assisted thoracoscopic surgery) for drainage of loculated pleural effusion. 2.  Talc pleurodesis for malignant pleural effusion. 3.  Placement of PleurX catheter for management of malignant pleural effusion. 4.  Placement of On-Q analgesia catheter system.    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   07/13/2018 Pathology Results   Pleura, peel, Left Pleural - FIBRO-FIBRINOUS PLEURITIS - NEGATIVE FOR MALIGNANCY   07/18/2018 Initial Diagnosis   Ovarian cancer, bilateral ([place])   07/20/2018 Procedure   EGD impression: Normal proximal esophagus and mid esophagus. Mild distal esophageal rings; dilation not performed because of esophagitis. LA Grade C reflux esophagitis. Z-line regular, 30 cm from the incisors. 5 cm hiatal hernia. Non-bleeding gastric ulcer with no stigmata of bleeding. Gastritis.  Duodenal erosions without bleeding. Normal  second portion of the duodenum. No specimens collected.   08/19/2018 Genetic Testing   Negative genetic testing on the common hereditary cancer panel.  The Common Hereditary Gene Panel offered by [facility] includes sequencing and/or deletion duplication testing of the following 48 genes: APC, ATM, AXIN2, BARD1, BMPR1A, BRCA1, BRCA2, BRIP1, CDH1, CDK4, CDKN2A (p14ARF), CDKN2A (p16INK4a), CHEK2, CTNNA1, DICER1, EPCAM (Deletion/duplication testing only), GREM1 (promoter region deletion/duplication testing only), KIT, MEN1, MLH1, MSH2, MSH3, MSH6, MUTYH, NBN, NF1, NHTL1, PALB2, PDGFRA, PMS2, POLD1, POLE, PTEN, RAD50, RAD51C, RAD51D, RNF43, SDHB, SDHC, SDHD, SMAD4, SMARCA4. STK11, TP53, TSC1, TSC2, and VHL.  The following genes were evaluated for sequence changes only: SDHA and HOXB13 c.251G>A variant only. The report date is August 19, 2018.   08/24/2018 -  Chemotherapy   The patient had palonosetron (ALOXI) injection 0.25 mg, 0.25 mg, Intravenous,  Once, 3 of 6 cycles Administration: 0.25 mg (08/24/2018), 0.25 mg (09/14/2018), 0.25 mg (10/05/2018) CARBOplatin (PARAPLATIN) 740 mg in sodium chloride 0.9 % 250 mL chemo infusion, 740 mg (100 % of original dose 742.8 mg), Intravenous,  Once, 3 of 6 cycles Dose modification:   (original dose 742.8 mg, Cycle 1),   (original dose 736.8 mg, Cycle 2),   (original dose 736.8 mg, Cycle 3) Administration: 740 mg (08/24/2018), 740 mg (09/14/2018), 740 mg (10/05/2018) PACLitaxel (TAXOL) 324 mg in sodium chloride 0.9 % 500 mL chemo infusion (> '80mg'$ /m2), 175 mg/m2 = 324 mg, Intravenous,  Once, 3 of 6 cycles Administration: 324 mg (08/24/2018), 324 mg (09/14/2018), 324 mg (10/05/2018) fosaprepitant (EMEND) 150 mg, dexamethasone (DECADRON) 12 mg in sodium chloride 0.9 % 145 mL IVPB, , Intravenous,  Once, 3 of 6 cycles Administration:  (08/24/2018),  (09/14/2018),  (10/05/2018)  for chemotherapy treatment.       CANCER STAGING: Cancer Staging No matching staging information was found  for the patient.   INTERVAL HISTORY:  Michelle Holt 48 y.o. female seen for cycle 3 of chemotherapy for ovarian cancer.  She tolerated cycle 2 reasonably well.  She did have occasional nausea which was helped with Zofran.  She did report abdominal cramping, particularly in the lower abdomen.  This is not associated with eating or bowel movements.  She has been taking Dilaudid 2-3 times a day.  She thinks is not effectively controlling it.  She also takes oxycodone 10 mg every 6 hours.  Denies any excessive drowsiness.  Appetite is 75%.  Energy levels are 50%.  REVIEW OF SYSTEMS:  Review of Systems  Gastrointestinal: Positive for abdominal pain and nausea.  Neurological: Positive for headaches.  Psychiatric/Behavioral: Positive for sleep disturbance.  All other systems reviewed and are negative.    PAST MEDICAL/SURGICAL HISTORY:  Past Medical History:  Diagnosis Date   Anxiety and depression    Arthritis of facet joints at multiple vertebral levels    L5-S1   Constipation    Dyslipidemia    Family history of breast cancer    Family history of uterine cancer    Insomnia    Irritable bowel syndrome    Migraine    Muscle tension headache    Ovarian carcinoma (HCC)    Plantar fasciitis of right foot    Port-A-Cath in place 08/20/2018   Past Surgical History:  Procedure Laterality Date   CHOLECYSTECTOMY  2008   COLONOSCOPY N/A 08/13/2013   Procedure: COLONOSCOPY;  Surgeon: Rogene Houston, MD;  [facility];  Service: Endoscopy;  Laterality: N/A;  230-moved to 145 Ann  to notify pt   ESOPHAGOGASTRODUODENOSCOPY     ESOPHAGOGASTRODUODENOSCOPY (EGD) WITH PROPOFOL N/A 07/20/2018   Procedure: ESOPHAGOGASTRODUODENOSCOPY (EGD) WITH PROPOFOL;  Surgeon: Rogene Houston, MD;  [facility];  Service: Endoscopy;  Laterality: N/A;  Possible esophageal dilation.   IR ANGIOGRAM SELECTIVE EACH ADDITIONAL VESSEL  08/01/2018   IR ANGIOGRAM VISCERAL SELECTIVE   08/01/2018   IR EMBO ART  VEN HEMORR LYMPH EXTRAV  INC GUIDE ROADMAPPING  08/01/2018   IR IMAGING GUIDED PORT INSERTION  08/20/2018   IR PERC PLEURAL DRAIN W/INDWELL CATH W/IMG GUIDE  07/08/2018   IR THORACENTESIS ASP PLEURAL SPACE W/IMG GUIDE  07/07/2018   IR US GUIDE VASC ACCESS RIGHT  08/01/2018   PLEURAL EFFUSION DRAINAGE Left 07/13/2018   Procedure: DRAINAGE OF LOCULATED PLEURAL EFFUSION;  Surgeon: Ivin Poot, MD;  Location: [place];  Service: Thoracic;  Laterality: Left;   REMOVAL OF PLEURAL DRAINAGE CATHETER Left 08/20/2018   Procedure: REMOVAL OF PLEURAL DRAINAGE CATHETER;  Surgeon: Ivin Poot, MD;  Location: [facility];  Service: Thoracic;  Laterality: Left;   REMOVAL OF PLEURAL DRAINAGE CATHETER Left 08/20/2018   Procedure: REMOVAL OF PLEURAL DRAINAGE CATHETER;  Surgeon: Ivin Poot, MD;  Location: [facility];  Service: Thoracic;  Laterality: Left;   TALC PLEURODESIS Left 07/13/2018   Procedure: Talc Pleuradesis;  Surgeon: Prescott Gum, Collier Salina, MD;  Location: [facility];  Service: Thoracic;  Laterality: Left;   TUBAL LIGATION Bilateral    UTERINE ABLATION     VIDEO ASSISTED THORACOSCOPY Left 07/13/2018   Procedure: VIDEO ASSISTED THORACOSCOPY;  Surgeon: Ivin Poot, MD;  Location: [place];  Service: Thoracic;  Laterality: Left;     SOCIAL HISTORY:  Social History   Socioeconomic History   Marital status: Widowed    Spouse name: Not on file   Number of children: 2   Years of education: 2-College   Highest education level: Not on file  Occupational History    Employer: [employer]  Social Needs   Financial resource strain: Not hard at all   Food insecurity    Worry: Never true    Inability: Never true   Transportation needs    Medical: No    Non-medical: No  Tobacco Use   Smoking status: Former Smoker    Packs/day: 0.50    Years: 17.00    Pack years: 8.50    Types: Cigarettes    Quit date: 06/22/2018    Years since quitting: 0.2   Smokeless tobacco: Never Used    Substance and Sexual Activity   Alcohol use: Yes    Alcohol/week: 1.0 standard drinks    Types: 1 Glasses of wine per week    Comment: Drinks alcohol on occasion   Drug use: No   Sexual activity: Not on file  Lifestyle   Physical activity    Days per week: 0 days    Minutes per session: 0 min   Stress: Very much  Relationships   Social connections    Talks on phone: More than three times a week    Gets together: More than three times a week    Attends religious service: 1 to 4 times per year    Active member of club or organization: No    Attends meetings of clubs or organizations: Never    Relationship status: Widowed   Intimate partner violence    Fear of current or ex partner: No    Emotionally abused: No    Physically abused:  No    Forced sexual activity: No  Other Topics Concern   Not on file  Social History Narrative   Patient lives at home with her daughter.    Patient has 2 children.    Patient is widowed.    Patient is right handed.    Patient has her Associates degree.       FAMILY HISTORY:  Family History  Problem Relation Age of Onset   Hypertension Mother    Obesity Mother    Diabetes Mother    Kidney disease Mother    Peripheral vascular disease Father    Atrial fibrillation Father    COPD Brother    Osteoporosis Brother    Crohn's disease Sister    Uterine cancer Sister 10       maternal half sister   Breast cancer Maternal Aunt 56   Colon cancer Neg Hx     CURRENT MEDICATIONS:  Outpatient Encounter Medications as of 10/05/2018  Medication Sig   benzonatate (TESSALON PERLES) 100 MG capsule Take 1 capsule (100 mg total) by mouth 3 (three) times daily as needed for cough. (Patient not taking: Reported on 09/14/2018)   CARBOPLATIN IV Inject into the vein every 21 ( twenty-one) days.   dexamethasone (DECADRON) 4 MG tablet TAKE ONE TABLET BY MOUTH DAILY.   diazepam (VALIUM) 5 MG tablet Take 1 tablet (5 mg total) by mouth at  bedtime.   dicyclomine (BENTYL) 10 MG capsule TAKE ONE CAPSULE BY MOUTH FOUR TIMES DAILY. TAKE BEFORE MEALS AND AT BEDTIME.   docusate sodium (COLACE) 100 MG capsule Take 1 capsule (100 mg total) by mouth 2 (two) times daily.   gabapentin (NEURONTIN) 300 MG capsule Take 1 capsule (300 mg total) by mouth 3 (three) times daily. (Patient not taking: Reported on 09/14/2018)   ibuprofen (ADVIL) 600 MG tablet Take 600 mg by mouth every 6 (six) hours as needed.   lidocaine-prilocaine (EMLA) cream Apply small amount to port a cath site and cover with plastic wrap one hour prior to chemotherapy appointments   ondansetron (ZOFRAN ODT) 8 MG disintegrating tablet Take 1 tablet (8 mg total) by mouth every 8 (eight) hours as needed for nausea or vomiting.   Oxycodone HCl 10 MG TABS Take 1 tablet (10 mg total) by mouth every 4 (four) hours as needed.   PACLITAXEL IV Inject into the vein every 21 ( twenty-one) days.   pantoprazole (PROTONIX) 40 MG tablet Take 1 tablet (40 mg total) by mouth 2 (two) times daily before a meal.   polyethylene glycol (MIRALAX / GLYCOLAX) 17 g packet Take 17 g by mouth as needed.   Polyethylene Glycol 1000 POWD Take 17 g by mouth daily.   prochlorperazine (COMPAZINE) 10 MG tablet Take 1 tablet (10 mg total) by mouth every 6 (six) hours as needed (Nausea or vomiting). (Patient not taking: Reported on 09/14/2018)   promethazine (PHENERGAN) 25 MG tablet Take 1 tablet (25 mg total) by mouth every 6 (six) hours as needed for nausea, vomiting or refractory nausea / vomiting. (Patient not taking: Reported on 09/14/2018)   SUMAtriptan (IMITREX) 100 MG tablet TAKE ONE TABLET BY MOUTH PRN UP to TWICE DAILY AS NEEDED.   [DISCONTINUED] HYDROmorphone (DILAUDID) 2 MG tablet Take 1 tablet (2 mg total) by mouth every 4 (four) hours as needed for severe pain.   [DISCONTINUED] HYDROmorphone (DILAUDID) 2 MG tablet TAKE ONE TABLET BY MOUTH EVERY 4 HOURS AS NEEDED FOR SEVERE PAIN.    [DISCONTINUED] HYDROmorphone (DILAUDID) 4  MG tablet Take 1 tablet (4 mg total) by mouth every 4 (four) hours as needed for severe pain. (Patient not taking: Reported on 09/14/2018)   [DISCONTINUED] Oxycodone HCl 10 MG TABS TAKE ONE TABLET BY MOUTH EVERY 6 HOURS AS NEEDED   [DISCONTINUED] polyethylene glycol powder (GLYCOLAX/MIRALAX) 17 GM/SCOOP powder TAKE 17 GRAMS (1 CAPFUL) IN 8 OZ OF FLUID ONCE DAILY   No facility-administered encounter medications on file as of 10/05/2018.     ALLERGIES:  Allergies  Allergen Reactions   Morphine And Related Itching   Nortriptyline Other (See Comments)    Significant weight gain   Topamax [Topiramate] Diarrhea    nausea   Actifed Cold-Allergy [Chlorpheniramine-Phenyleph Er] Rash   Amoxicillin Rash    Did it involve swelling of the face/tongue/throat, SOB, or low BP? Unknown Did it involve sudden or severe rash/hives, skin peeling, or any reaction on the inside of your mouth or nose? Unknown Did you need to seek medical attention at a hospital or doctor's office? Unknown When did it last happen? teenager If all above answers are "NO", may proceed with cephalosporin use.    Codeine Hives   Erythromycin Rash   Penicillins Rash    Did it involve swelling of the face/tongue/throat, SOB, or low BP? Unknown Did it involve sudden or severe rash/hives, skin peeling, or any reaction on the inside of your mouth or nose? Unknown Did you need to seek medical attention at a hospital or doctor's office? Unknown When did it last happen? teenager If all above answers are "NO", may proceed with cephalosporin use.    Sudafed [Pseudoephedrine Hcl] Rash     PHYSICAL EXAM:  ECOG Performance status: 1  Vitals:   10/05/18 0815  BP: 117/75  Pulse: (!) 101  Resp: 18  Temp: (!) 97.5 F (36.4 C)  SpO2: 100%   Filed Weights   10/05/18 0815  Weight: 163 lb 12.8 oz (74.3 kg)    Physical Exam Vitals signs reviewed.  Constitutional:       Appearance: Normal appearance.  Cardiovascular:     Rate and Rhythm: Normal rate and regular rhythm.     Heart sounds: Normal heart sounds.  Pulmonary:     Effort: Pulmonary effort is normal.     Breath sounds: Normal breath sounds.     Comments: Decreased breath sounds at left lung base.  Left Pleurx catheter present. Abdominal:     General: There is no distension.     Palpations: Abdomen is soft. There is no mass.  Skin:    General: Skin is warm.  Neurological:     General: No focal deficit present.     Mental Status: She is alert and oriented to person, place, and time.  Psychiatric:        Mood and Affect: Mood normal.        Behavior: Behavior normal.      LABORATORY DATA:  I have reviewed the labs as listed.  CBC    Component Value Date/Time   WBC 5.9 10/05/2018 0833   RBC 4.30 10/05/2018 0833   HGB 13.5 10/05/2018 0833   HCT 42.2 10/05/2018 0833   PLT 322 10/05/2018 0833   MCV 98.1 10/05/2018 0833   MCH 31.4 10/05/2018 0833   MCHC 32.0 10/05/2018 0833   RDW 20.0 (H) 10/05/2018 0833   LYMPHSABS 2.4 10/05/2018 0833   MONOABS 0.6 10/05/2018 0833   EOSABS 0.0 10/05/2018 0833   BASOSABS 0.0 10/05/2018 0833   CMP Latest Ref Rng &  Units 10/05/2018 09/22/2018 09/14/2018  Glucose 70 - 99 mg/dL 112(H) 91 111(H)  BUN 6 - 20 mg/dL '11 17 14  '$ Creatinine 0.44 - 1.00 mg/dL 0.53 0.65 0.57  Sodium 135 - 145 mmol/L 139 134(L) 134(L)  Potassium 3.5 - 5.1 mmol/L 3.7 4.4 4.1  Chloride 98 - 111 mmol/L 105 96(L) 101  CO2 22 - 32 mmol/L '25 27 25  '$ Calcium 8.9 - 10.3 mg/dL 9.2 9.3 9.5  Total Protein 6.5 - 8.1 g/dL 7.2 7.1 7.4  Total Bilirubin 0.3 - 1.2 mg/dL 0.8 0.8 0.7  Alkaline Phos 38 - 126 U/L 122 105 180(H)  AST 15 - 41 U/L 18 34 113(H)  ALT 0 - 44 U/L 24 59(H) 193(H)       DIAGNOSTIC IMAGING:  I have independently reviewed the scans and discussed with the patient.   I have reviewed Venita Lick LPN's note and agree with the documentation.  I personally performed a  face-to-face visit, made revisions and my assessment and plan is as follows.    ASSESSMENT & PLAN:   Ovarian cancer, bilateral ([place]) 1.  Clinical stage IVa ovarian cancer, positive cytology of left pleural effusion: -2 cycles of carboplatin and paclitaxel on 08/24/2018 and 09/14/2018. - Germline mutation testing is negative. - She has tolerated her cycle 2 fairly well.  She does report some abdominal cramping mainly in the lower abdomen.  She is taking Bentyl as needed. - We reviewed her blood work.  She will proceed with cycle 3 at the same dose levels. -I plan to repeat CT CAP prior to next visit in 3 weeks.  She has an appointment to see Dr. Denman George in the second week of October.  2.  Left chest wall and abdominal pain: - She is taking oxycodone 10 mg every 6 hours.  She is also taking Dilaudid.  We will discontinue Dilaudid.  She will take oxycodone 10 mg every 4 hours as needed.  I sent a prescription.  3.  Nausea/vomiting: - She is taking Zofran ODT 8 mg every 8 hours.  She also takes dexamethasone 4 mg for 1 week after each treatment.  4.  Splenic hematoma: -Ligation of splenic artery on 08/01/2018.  CT CAP on 08/21/2018 showed stable hematoma. -Hemoglobin has been stable.   Total time spent is 25 minutes with more than 50% of the time spent face-to-face discussing treatment plan, counseling and coordination of care.  Orders placed this encounter:  Orders Placed This Encounter  Procedures   CBC with Differential/Platelet   Comprehensive metabolic panel   CA 725      Maelie Chriswell, MD [place] [phone]  \

## 2018-10-05 NOTE — Patient Instructions (Signed)
[facility] Discharge Instructions for Patients Receiving Chemotherapy  Today you received the following chemotherapy agents   To help prevent nausea and vomiting after your treatment, we encourage you to take your nausea medication   If you develop nausea and vomiting that is not controlled by your nausea medication, call the clinic.   BELOW ARE SYMPTOMS THAT SHOULD BE REPORTED IMMEDIATELY:  *FEVER GREATER THAN 100.5 F  *CHILLS WITH OR WITHOUT FEVER  NAUSEA AND VOMITING THAT IS NOT CONTROLLED WITH YOUR NAUSEA MEDICATION  *UNUSUAL SHORTNESS OF BREATH  *UNUSUAL BRUISING OR BLEEDING  TENDERNESS IN MOUTH AND THROAT WITH OR WITHOUT PRESENCE OF ULCERS  *URINARY PROBLEMS  *BOWEL PROBLEMS  UNUSUAL RASH Items with * indicate a potential emergency and should be followed up as soon as possible.  Feel free to call the clinic should you have any questions or concerns. The clinic phone number is [phone].  Please show the CHEMO ALERT CARD at check-in to the Emergency Department and triage nurse.   

## 2018-10-13 ENCOUNTER — Other Ambulatory Visit: Payer: Self-pay

## 2018-10-13 ENCOUNTER — Ambulatory Visit (HOSPITAL_COMMUNITY)
Admission: RE | Admit: 2018-10-13 | Discharge: 2018-10-13 | Disposition: A | Discharge status: Home / Self Care | Payer: Medicaid Other | Source: Ambulatory Visit | Attending: Hematology | Admitting: Hematology

## 2018-10-13 DIAGNOSIS — C562 Malignant neoplasm of left ovary: Secondary | ICD-10-CM | POA: Insufficient documentation

## 2018-10-13 DIAGNOSIS — C561 Malignant neoplasm of right ovary: Secondary | ICD-10-CM | POA: Insufficient documentation

## 2018-10-13 DIAGNOSIS — C563 Malignant neoplasm of bilateral ovaries: Secondary | ICD-10-CM

## 2018-10-13 MED ORDER — IOHEXOL 300 MG/ML  SOLN
100.0000 mL | Freq: Once | INTRAMUSCULAR | Status: AC | PRN
  Administered 2018-10-13: 100 mL via INTRAVENOUS

## 2018-10-13 MED ORDER — IOHEXOL 300 MG/ML  SOLN
30.0000 mL | Freq: Once | INTRAMUSCULAR | Status: AC | PRN
  Administered 2018-10-13: 30 mL via ORAL

## 2018-10-19 ENCOUNTER — Telehealth: Payer: Self-pay | Admitting: *Deleted

## 2018-10-19 ENCOUNTER — Ambulatory Visit: Payer: Self-pay | Admitting: Gynecologic Oncology

## 2018-10-19 ENCOUNTER — Inpatient Hospital Stay: Payer: Medicaid Other | Admitting: Gynecologic Oncology

## 2018-10-19 NOTE — Telephone Encounter (Signed)
Received a message to cancel the appt for the patient, due to her son being in the hospital. Appt canceled

## 2018-10-20 ENCOUNTER — Other Ambulatory Visit (HOSPITAL_COMMUNITY): Payer: Self-pay | Admitting: *Deleted

## 2018-10-20 DIAGNOSIS — C561 Malignant neoplasm of right ovary: Secondary | ICD-10-CM

## 2018-10-20 DIAGNOSIS — C563 Malignant neoplasm of bilateral ovaries: Secondary | ICD-10-CM

## 2018-10-20 MED ORDER — OXYCODONE HCL 10 MG PO TABS: 10.0000 mg | ORAL_TABLET | ORAL | 0 refills | Status: DC | PRN

## 2018-10-26 ENCOUNTER — Other Ambulatory Visit (HOSPITAL_COMMUNITY): Payer: Medicaid Other

## 2018-10-26 ENCOUNTER — Ambulatory Visit (HOSPITAL_COMMUNITY): Payer: Medicaid Other | Admitting: Hematology

## 2018-10-26 ENCOUNTER — Ambulatory Visit (HOSPITAL_COMMUNITY): Payer: Medicaid Other

## 2018-10-29 ENCOUNTER — Other Ambulatory Visit (HOSPITAL_COMMUNITY): Payer: Self-pay | Admitting: *Deleted

## 2018-10-29 MED ORDER — PANTOPRAZOLE SODIUM 40 MG PO TBEC: 40.0000 mg | DELAYED_RELEASE_TABLET | Freq: Two times a day (BID) | ORAL | 2 refills | Status: DC

## 2018-10-30 ENCOUNTER — Encounter (HOSPITAL_COMMUNITY): Payer: Medicaid Other

## 2018-11-04 ENCOUNTER — Other Ambulatory Visit (HOSPITAL_COMMUNITY): Payer: Self-pay | Admitting: *Deleted

## 2018-11-04 DIAGNOSIS — C561 Malignant neoplasm of right ovary: Secondary | ICD-10-CM

## 2018-11-04 DIAGNOSIS — C563 Malignant neoplasm of bilateral ovaries: Secondary | ICD-10-CM

## 2018-11-04 MED ORDER — OXYCODONE HCL 10 MG PO TABS: 10.0000 mg | ORAL_TABLET | ORAL | 0 refills | Status: DC | PRN

## 2018-11-06 ENCOUNTER — Inpatient Hospital Stay (HOSPITAL_COMMUNITY): Payer: Medicaid Other

## 2018-11-06 ENCOUNTER — Other Ambulatory Visit: Payer: Self-pay

## 2018-11-06 ENCOUNTER — Inpatient Hospital Stay (HOSPITAL_BASED_OUTPATIENT_CLINIC_OR_DEPARTMENT_OTHER): Payer: Medicaid Other | Admitting: Hematology

## 2018-11-06 ENCOUNTER — Inpatient Hospital Stay (HOSPITAL_COMMUNITY): Payer: Medicaid Other | Attending: Hematology

## 2018-11-06 VITALS — BP 109/65 | HR 87 | Temp 97.1°F | Resp 18 | Wt 166.0 lb

## 2018-11-06 DIAGNOSIS — T451X5A Adverse effect of antineoplastic and immunosuppressive drugs, initial encounter: Secondary | ICD-10-CM | POA: Diagnosis not present

## 2018-11-06 DIAGNOSIS — D735 Infarction of spleen: Secondary | ICD-10-CM | POA: Insufficient documentation

## 2018-11-06 DIAGNOSIS — C563 Malignant neoplasm of bilateral ovaries: Secondary | ICD-10-CM

## 2018-11-06 DIAGNOSIS — C561 Malignant neoplasm of right ovary: Secondary | ICD-10-CM

## 2018-11-06 DIAGNOSIS — Z87891 Personal history of nicotine dependence: Secondary | ICD-10-CM | POA: Insufficient documentation

## 2018-11-06 DIAGNOSIS — Z79899 Other long term (current) drug therapy: Secondary | ICD-10-CM | POA: Insufficient documentation

## 2018-11-06 DIAGNOSIS — Z8049 Family history of malignant neoplasm of other genital organs: Secondary | ICD-10-CM | POA: Diagnosis not present

## 2018-11-06 DIAGNOSIS — Z803 Family history of malignant neoplasm of breast: Secondary | ICD-10-CM | POA: Diagnosis not present

## 2018-11-06 DIAGNOSIS — Z9221 Personal history of antineoplastic chemotherapy: Secondary | ICD-10-CM | POA: Diagnosis not present

## 2018-11-06 DIAGNOSIS — Z791 Long term (current) use of non-steroidal anti-inflammatories (NSAID): Secondary | ICD-10-CM | POA: Diagnosis not present

## 2018-11-06 DIAGNOSIS — G629 Polyneuropathy, unspecified: Secondary | ICD-10-CM | POA: Diagnosis not present

## 2018-11-06 DIAGNOSIS — C562 Malignant neoplasm of left ovary: Secondary | ICD-10-CM | POA: Insufficient documentation

## 2018-11-06 DIAGNOSIS — I10 Essential (primary) hypertension: Secondary | ICD-10-CM | POA: Insufficient documentation

## 2018-11-06 DIAGNOSIS — M545 Low back pain: Secondary | ICD-10-CM | POA: Diagnosis not present

## 2018-11-06 LAB — CBC WITH DIFFERENTIAL/PLATELET
Abs Immature Granulocytes: 0.01 10*3/uL (ref 0.00–0.07)
Basophils Absolute: 0 10*3/uL (ref 0.0–0.1)
Basophils Relative: 0 %
Eosinophils Absolute: 0.3 10*3/uL (ref 0.0–0.5)
Eosinophils Relative: 5 %
HCT: 36.6 % (ref 36.0–46.0)
Hemoglobin: 11.9 g/dL — ABNORMAL LOW (ref 12.0–15.0)
Immature Granulocytes: 0 %
Lymphocytes Relative: 31 %
Lymphs Abs: 1.9 10*3/uL (ref 0.7–4.0)
MCH: 34.1 pg — ABNORMAL HIGH (ref 26.0–34.0)
MCHC: 32.5 g/dL (ref 30.0–36.0)
MCV: 104.9 fL — ABNORMAL HIGH (ref 80.0–100.0)
Monocytes Absolute: 0.7 10*3/uL (ref 0.1–1.0)
Monocytes Relative: 11 %
Neutro Abs: 3.3 10*3/uL (ref 1.7–7.7)
Neutrophils Relative %: 53 %
Platelets: 326 10*3/uL (ref 150–400)
RBC: 3.49 MIL/uL — ABNORMAL LOW (ref 3.87–5.11)
RDW: 23 % — ABNORMAL HIGH (ref 11.5–15.5)
WBC: 6.2 10*3/uL (ref 4.0–10.5)
nRBC: 0 % (ref 0.0–0.2)

## 2018-11-06 LAB — COMPREHENSIVE METABOLIC PANEL
ALT: 17 U/L (ref 0–44)
AST: 21 U/L (ref 15–41)
Albumin: 3.7 g/dL (ref 3.5–5.0)
Alkaline Phosphatase: 84 U/L (ref 38–126)
Anion gap: 9 (ref 5–15)
BUN: 11 mg/dL (ref 6–20)
CO2: 24 mmol/L (ref 22–32)
Calcium: 9.4 mg/dL (ref 8.9–10.3)
Chloride: 105 mmol/L (ref 98–111)
Creatinine, Ser: 0.58 mg/dL (ref 0.44–1.00)
GFR calc Af Amer: >60 mL/min (ref >60)
GFR calc non Af Amer: >60 mL/min (ref >60)
Glucose, Bld: 101 mg/dL — ABNORMAL HIGH (ref 70–99)
Potassium: 4 mmol/L (ref 3.5–5.1)
Sodium: 138 mmol/L (ref 135–145)
Total Bilirubin: 1 mg/dL (ref 0.3–1.2)
Total Protein: 6.6 g/dL (ref 6.5–8.1)

## 2018-11-06 NOTE — Progress Notes (Signed)
Michelle Holt presented for Portacath access and flush. Portacath located right chest wall accessed with  H 20 needle. Good blood return present. Portacath flushed with 31ml NS and 500U/77ml Heparin and needle removed intact. Procedure without incident. Patient tolerated procedure well.

## 2018-11-06 NOTE — Patient Instructions (Signed)
[facility] at [facility] Discharge Instructions  You were seen today by Dr. Katragadda. He went over your recent lab results. He will see you back in 3 weeks for labs, treatment and follow up.   Thank you for choosing [facility] at [facility] to provide your oncology and hematology care.  To afford each patient quality time with our provider, please arrive at least 15 minutes before your scheduled appointment time.   If you have a lab appointment with the Cancer Center please come in thru the  Main Entrance and check in at the main information desk  You need to re-schedule your appointment should you arrive 10 or more minutes late.  We strive to give you quality time with our providers, and arriving late affects you and other patients whose appointments are after yours.  Also, if you no show three or more times for appointments you may be dismissed from the clinic at the providers discretion.     Again, thank you for choosing [facility].  Our hope is that these requests will decrease the amount of time that you wait before being seen by our physicians.       _____________________________________________________________  Should you have questions after your visit to  Cancer Center, please contact our office at [phone] between the hours of 8:00 a.m. and 4:30 p.m.  Voicemails left after 4:00 p.m. will not be returned until the following business day.  For prescription refill requests, have your pharmacy contact our office and allow 72 hours.    Cancer Center Support Programs:   > Cancer Support Group  2nd Tuesday of the month 1pm-2pm, Journey Room    

## 2018-11-06 NOTE — Progress Notes (Signed)
Nutrition Follow-up:  Patient with stage IV ovarian cancer with left pleural effusion.  Patient has completed 3 cycles of chemotherapy.    Met with patient prior to meeting with MD today in clinic.  Patient reports that she is doing good.  Reports appetite is good. Continues to eat good sources of protein and drinks protein shake.  Denies nutrition impact symptoms at this time.      Medications: dexamethasone, bentyl, zofran, protonix, phenergan, miralax  Labs: no back yet at time of documentation  Anthropometrics:   Weight 166 lb increased from 158 lb on 8/31  Starting weight 158 lb   NUTRITION DIAGNOSIS: Inadequate oral intake stable   INTERVENTION:  Encouraged continued good sources of protein and well balanced diet.  Patient instructed to call RD if appetite changes and/or has weight loss.  Patient has contact information    MONITORING, EVALUATION, GOAL: Patient will consume adequate calories and protein to maintain lean muscle mass during treatment   NEXT VISIT: no follow-up at this time as appetite is good, weight has increased.  Patient to contact RD if something changes  Sayre Mazor B. Zenia Resides, [place], [place] Registered Dietitian [phone] (pager)

## 2018-11-07 LAB — CA 125: Cancer Antigen (CA) 125: 144 U/mL — ABNORMAL HIGH (ref 0.0–38.1)

## 2018-11-07 NOTE — Progress Notes (Signed)
[facility] [address], [place] [address]   CLINIC:  Medical Oncology/Hematology  PCP:  Michelle Chroman, MD [place] [phone]   REASON FOR VISIT:  Follow-up for ovarian cancer, splenic hematoma.   BRIEF ONCOLOGIC HISTORY:  Oncology History  Ovarian cancer, bilateral ([place])  07/07/2018 Pathology Results   PLEURAL FLUID, LEFT (SPECIMEN 1 OF 1, COLLECTED 07/07/18): - MALIGNANT CELLS CONSISTENT WITH ADENOCARCINOMA - SEE COMMENT  Source Pleural Fluid, (Specimen 1 of 1, collected on 07/07/2018) Gross Specimen: Received is/are 1000cc of bloody red fluid with tissue. (TC:tc) Prepared: # Smears: 0 # Concentration Technique Slides (i.e. ThinPrep): 1 # Cell Block: 1 Conventional Additional Studies: Two Hematology slides labeled [id number] Comment The malignant cells are positive for cytokeratin 7, p53, WT-1, Pax-8, Moc31, ER (weak) and EMA but negative for cytokeratin 20, TTF-1, GATA-3, CDX-2 and D2-40. Overall, the phenotype is consistent with a gynecologic primary; clinical correlation recommended.   07/07/2018 Procedure   Successful ultrasound guided left thoracentesis yielding 2.0 L of pleural fluid   07/08/2018 Procedure   1. Technically successful placement of left 14 French pigtail chest drain, placed to Pleur-evac water-seal.   07/08/2018 Procedure   1. Technically successful five [place] double lumen power injectable PICC placement   07/09/2018 Imaging   Ct chest 1. There is a moderate, loculated left hydropneumothorax with a small air component and moderate fluid component. The largest loculated component is located posteriorly. There is a pigtail drainage catheter about the lateral pleural space. There is no obvious etiology, such as obvious mass or pleural disease.   2. There is a small right pleural effusion with associated atelectasis or consolidation and a subpleural consolidation of the superior segment right lower lobe (series  4, image 56), of uncertain significance, possibly infectious or inflammatory   07/10/2018 Imaging   Ct abdomen and pelvis: 1. The bilateral ovaries are enlarged by heterogeneous appearing cystic lesions, measuring 5.3 x 4.2 cm on the right (series 4, image 72) and 4.5 x 3.2 cm on the left (series 4, image 75). Consider dedicated pelvic ultrasound and/or pelvic MRI to further evaluate for solid components given high suspicion for GYN primary malignancy.   2. No other evidence of mass and no lymphadenopathy in the abdomen or pelvis.   3. Trace ascites. There is some suggestion of omental and peritoneal nodularity (e.g. Series 4, image 28), concerning for peritoneal metastatic disease.    4. Loculated left-sided pleural effusion with left-sided pleural drainage catheter in position. Small right pleural effusion   07/13/2018 Surgery   OPERATION: 1.  Left VATS (video-assisted thoracoscopic surgery) for drainage of loculated pleural effusion. 2.  Talc pleurodesis for malignant pleural effusion. 3.  Placement of PleurX catheter for management of malignant pleural effusion. 4.  Placement of On-Q analgesia catheter system.    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   07/13/2018 Pathology Results   Pleura, peel, Left Pleural - FIBRO-FIBRINOUS PLEURITIS - NEGATIVE FOR MALIGNANCY   07/18/2018 Initial Diagnosis   Ovarian cancer, bilateral ([place])   07/20/2018 Procedure   EGD impression: Normal proximal esophagus and mid esophagus. Mild distal esophageal rings; dilation not performed because of esophagitis. LA Grade C reflux esophagitis. Z-line regular, 30 cm from the incisors. 5 cm hiatal hernia. Non-bleeding gastric ulcer with no stigmata of bleeding. Gastritis.  Duodenal erosions without bleeding. Normal  second portion of the duodenum. No specimens collected.   08/19/2018 Genetic Testing   Negative genetic testing on the common hereditary cancer panel.  The Common Hereditary Gene Panel offered by [facility] includes sequencing and/or deletion duplication testing of the following 48 genes: APC, ATM, AXIN2, BARD1, BMPR1A, BRCA1, BRCA2, BRIP1, CDH1, CDK4, CDKN2A (p14ARF), CDKN2A (p16INK4a), CHEK2, CTNNA1, DICER1, EPCAM (Deletion/duplication testing only), GREM1 (promoter region deletion/duplication testing only), KIT, MEN1, MLH1, MSH2, MSH3, MSH6, MUTYH, NBN, NF1, NHTL1, PALB2, PDGFRA, PMS2, POLD1, POLE, PTEN, RAD50, RAD51C, RAD51D, RNF43, SDHB, SDHC, SDHD, SMAD4, SMARCA4. STK11, TP53, TSC1, TSC2, and VHL.  The following genes were evaluated for sequence changes only: SDHA and HOXB13 c.251G>A variant only. The report date is August 19, 2018.   08/24/2018 -  Chemotherapy   The patient had palonosetron (ALOXI) injection 0.25 mg, 0.25 mg, Intravenous,  Once, 3 of 6 cycles Administration: 0.25 mg (08/24/2018), 0.25 mg (09/14/2018), 0.25 mg (10/05/2018) CARBOplatin (PARAPLATIN) 740 mg in sodium chloride 0.9 % 250 mL chemo infusion, 740 mg (100 % of original dose 742.8 mg), Intravenous,  Once, 3 of 6 cycles Dose modification:   (original dose 742.8 mg, Cycle 1),   (original dose 736.8 mg, Cycle 2),   (original dose 736.8 mg, Cycle 3) Administration: 740 mg (08/24/2018), 740 mg (09/14/2018), 740 mg (10/05/2018) PACLitaxel (TAXOL) 324 mg in sodium chloride 0.9 % 500 mL chemo infusion (> 73m/m2), 175 mg/m2 = 324 mg, Intravenous,  Once, 3 of 6 cycles Administration: 324 mg (08/24/2018), 324 mg (09/14/2018), 324 mg (10/05/2018) fosaprepitant (EMEND) 150 mg, dexamethasone (DECADRON) 12 mg in sodium chloride 0.9 % 145 mL IVPB, , Intravenous,  Once, 3 of 6 cycles Administration:  (08/24/2018),  (09/14/2018),  (10/05/2018)  for chemotherapy treatment.       CANCER STAGING: Cancer Staging No matching staging information was found  for the patient.   INTERVAL HISTORY:  [name].[id number]. female seen for follow-up of ovarian cancer.  She received cycle 3 on 10/05/2018.  She had CT scan done on 10/13/2018.  She missed appointment with Michelle Holt  She reports that her [facility] had a fatal [facility] and [facility]  She is having to go back and forth to visit him.  She reported low back pain and left lateral rib pain which is well controlled with oxycodone.  She is taking 10 mg tablets 4 to 5/day.  She reports some numbness in the hands at nighttime.  No pins-and-needles sensation.  Denies any nausea, vomiting, diarrhea or constipation.  No abdominal pain reported.  REVIEW OF SYSTEMS:  Review of Systems  Constitutional: Positive for fatigue.  Musculoskeletal: Positive for back pain.  Psychiatric/Behavioral: Positive for sleep disturbance.  All other systems reviewed and are negative.    PAST MEDICAL/SURGICAL HISTORY:  Past Medical History:  Diagnosis Date  . Anxiety and depression   . Arthritis of facet joints at multiple vertebral levels    L5-S1  . Constipation   . Dyslipidemia   . Family history of breast cancer   . Family history of uterine cancer   . Insomnia   . Irritable bowel syndrome   . Migraine   . Muscle tension headache   . [place]varian carcinoma ([place]   . Plantar fasciitis of right foot   . Port-A-Cath in place 08/20/2018   Past Surgical History:  Procedure Laterality Date  . CHOLECYSTECTOMY  2008  . COLONOSCOPY N/A 08/13/2013   Procedure: COLONOSCOPY;  Surgeon: NBernadene Person  Gloriann Loan, MD;  [facility];  Service: Endoscopy;  Laterality: N/A;  230-moved to 145 Ann to notify pt  . ESOPHAGOGASTRODUODENOSCOPY    . ESOPHAGOGASTRODUODENOSCOPY (EGD) WITH PROPOFOL N/A 07/20/2018   Procedure: ESOPHAGOGASTRODUODENOSCOPY (EGD) WITH PROPOFOL;  Surgeon: Rogene Houston, MD;  [facility];  Service: Endoscopy;  Laterality: N/A;  Possible esophageal dilation.  . IR ANGIOGRAM  SELECTIVE EACH ADDITIONAL VESSEL  08/01/2018  . IR ANGIOGRAM VISCERAL SELECTIVE  08/01/2018  . IR EMBO ART  VEN HEMORR LYMPH EXTRAV  INC GUIDE ROADMAPPING  08/01/2018  . IR IMAGING GUIDED PORT INSERTION  08/20/2018  . IR PERC PLEURAL DRAIN W/INDWELL CATH W/IMG GUIDE  07/08/2018  . IR THORACENTESIS ASP PLEURAL SPACE W/IMG GUIDE  07/07/2018  . IR US GUIDE VASC ACCESS RIGHT  08/01/2018  . PLEURAL EFFUSION DRAINAGE Left 07/13/2018   Procedure: DRAINAGE OF LOCULATED PLEURAL EFFUSION;  Surgeon: Ivin Poot, MD;  Location: [facility];  Service: Thoracic;  Laterality: Left;  . REMOVAL OF PLEURAL DRAINAGE CATHETER Left 08/20/2018   Procedure: REMOVAL OF PLEURAL DRAINAGE CATHETER;  Surgeon: Ivin Poot, MD;  Location: [facility];  Service: Thoracic;  Laterality: Left;  . REMOVAL OF PLEURAL DRAINAGE CATHETER Left 08/20/2018   Procedure: REMOVAL OF PLEURAL DRAINAGE CATHETER;  Surgeon: Ivin Poot, MD;  Location: [facility];  Service: Thoracic;  Laterality: Left;  . TALC PLEURODESIS Left 07/13/2018   Procedure: Talc Pleuradesis;  Surgeon: Prescott Gum, Collier Salina, MD;  Location: [facility];  Service: Thoracic;  Laterality: Left;  . TUBAL LIGATION Bilateral   . UTERINE ABLATION    . VIDEO ASSISTED THORACOSCOPY Left 07/13/2018   Procedure: VIDEO ASSISTED THORACOSCOPY;  Surgeon: Ivin Poot, MD;  Location: [facility] OR;  Service: Thoracic;  Laterality: Left;     SOCIAL HISTORY:  Social History   Socioeconomic History  . Marital status: Widowed    Spouse name: Not on file  . Number of children: 2  . Years of education: 2-College  . Highest education level: Not on file  Occupational History    Employer: [employer]  . Financial resource strain: Not hard at all  . Food insecurity    Worry: Never true    Inability: Never true  . Transportation needs    Medical: No    Non-medical: No  Tobacco Use  . Smoking status: Former Smoker    Packs/day: 0.50    Years: 17.00    Pack years: 8.50    Types: Cigarettes    Quit  date: 06/22/2018    Years since quitting: 0.3  . Smokeless tobacco: Never Used  Substance and Sexual Activity  . Alcohol use: Yes    Alcohol/week: 1.0 standard drinks    Types: 1 Glasses of wine per week    Comment: Drinks alcohol on occasion  . Drug use: No  . Sexual activity: Not on file  Lifestyle  . Physical activity    Days per week: 0 days    Minutes per session: 0 min  . Stress: Very much  Relationships  . Social connections    Talks on phone: More than three times a week    Gets together: More than three times a week    Attends religious service: 1 to 4 times per year    Active member of club or organization: No    Attends meetings of clubs or organizations: Never    Relationship status: Widowed  . Intimate partner violence  Fear of current or ex partner: No    Emotionally abused: No    Physically abused: No    Forced sexual activity: No  Other Topics Concern  . Not on file  Social History Narrative   Patient lives at home with her daughter.    Patient has 2 children.    Patient is widowed.    Patient is right handed.    Patient has her Associates degree.       FAMILY HISTORY:  Family History  Problem Relation Age of Onset  . Hypertension Mother   . Obesity Mother   . Diabetes Mother   . Kidney disease Mother   . Peripheral vascular disease Father   . Atrial fibrillation Father   . COPD Brother   . Osteoporosis Brother   . Crohn's disease Sister   . Uterine cancer Sister 30       maternal half sister  . Breast cancer Maternal Aunt 82  . Colon cancer Neg Hx     CURRENT MEDICATIONS:  Outpatient Encounter Medications as of 11/06/2018  Medication Sig  . CARBOPLATIN IV Inject into the vein every 21 ( twenty-one) days.  . diazepam (VALIUM) 5 MG tablet Take 1 tablet (5 mg total) by mouth at bedtime.  . Oxycodone HCl 10 MG TABS Take 1 tablet (10 mg total) by mouth every 4 (four) hours as needed.  Marland Kitchen PACLITAXEL IV Inject into the vein every 21 (  twenty-one) days.  . pantoprazole (PROTONIX) 40 MG tablet Take 1 tablet (40 mg total) by mouth 2 (two) times daily before a meal.  . dexamethasone (DECADRON) 4 MG tablet TAKE ONE TABLET BY MOUTH DAILY. (Patient not taking: Reported on 11/06/2018)  . dicyclomine (BENTYL) 10 MG capsule TAKE ONE CAPSULE BY MOUTH FOUR TIMES DAILY. TAKE BEFORE MEALS AND AT BEDTIME. (Patient not taking: Reported on 11/06/2018)  . lidocaine-prilocaine (EMLA) cream Apply small amount to port a cath site and cover with plastic wrap one hour prior to chemotherapy appointments (Patient not taking: Reported on 11/06/2018)  . ondansetron (ZOFRAN ODT) 8 MG disintegrating tablet Take 1 tablet (8 mg total) by mouth every 8 (eight) hours as needed for nausea or vomiting. (Patient not taking: Reported on 11/06/2018)  . polyethylene glycol (MIRALAX / GLYCOLAX) 17 g packet Take 17 g by mouth as needed.  . promethazine (PHENERGAN) 25 MG tablet Take 1 tablet (25 mg total) by mouth every 6 (six) hours as needed for nausea, vomiting or refractory nausea / vomiting. (Patient not taking: Reported on 09/14/2018)  . SUMAtriptan (IMITREX) 100 MG tablet TAKE ONE TABLET BY MOUTH PRN UP to TWICE DAILY AS NEEDED. (Patient not taking: Reported on 11/06/2018)  . [DISCONTINUED] benzonatate (TESSALON PERLES) 100 MG capsule Take 1 capsule (100 mg total) by mouth 3 (three) times daily as needed for cough. (Patient not taking: Reported on 09/14/2018)  . [DISCONTINUED] gabapentin (NEURONTIN) 300 MG capsule Take 1 capsule (300 mg total) by mouth 3 (three) times daily. (Patient not taking: Reported on 09/14/2018)  . [DISCONTINUED] ibuprofen (ADVIL) 600 MG tablet Take 600 mg by mouth every 6 (six) hours as needed.  . [DISCONTINUED] Polyethylene Glycol 1000 POWD Take 17 g by mouth daily.  . [DISCONTINUED] prochlorperazine (COMPAZINE) 10 MG tablet Take 1 tablet (10 mg total) by mouth every 6 (six) hours as needed (Nausea or vomiting). (Patient not taking: Reported on  09/14/2018)   No facility-administered encounter medications on file as of 11/06/2018.     ALLERGIES:  Allergies  Allergen Reactions  . Morphine And Related Itching  . Nortriptyline Other (See Comments)    Significant weight gain  . Topamax [Topiramate] Diarrhea    nausea  . Actifed Cold-Allergy [Chlorpheniramine-Phenyleph Er] Rash  . Amoxicillin Rash    Did it involve swelling of the face/tongue/throat, SOB, or low BP? Unknown Did it involve sudden or severe rash/hives, skin peeling, or any reaction on the inside of your mouth or nose? Unknown Did you need to seek medical attention at a hospital or doctor's office? Unknown When did it last happen? teenager If all above answers are "NO", may proceed with cephalosporin use.   . Codeine Hives  . Erythromycin Rash  . Penicillins Rash    Did it involve swelling of the face/tongue/throat, SOB, or low BP? Unknown Did it involve sudden or severe rash/hives, skin peeling, or any reaction on the inside of your mouth or nose? Unknown Did you need to seek medical attention at a hospital or doctor's office? Unknown When did it last happen? teenager If all above answers are "NO", may proceed with cephalosporin use.   [name] [Pseudoephedrine Hcl] Rash     PHYSICAL EXAM:  ECOG Performance status: 1  Vitals:   11/06/18 0836  BP: 109/65  Pulse: 87  Resp: 18  Temp: (!) 97.1 F (36.2 C)  SpO2: 99%   Filed Weights   11/06/18 0836  Weight: 166 lb (75.3 kg)    Physical Exam Vitals signs reviewed.  Constitutional:      Appearance: Normal appearance.  Cardiovascular:     Rate and Rhythm: Normal rate and regular rhythm.     Heart sounds: Normal heart sounds.  Pulmonary:     Effort: Pulmonary effort is normal.     Breath sounds: Normal breath sounds.  Abdominal:     General: There is no distension.     Palpations: Abdomen is soft. There is no mass.  Skin:    General: Skin is warm.  Neurological:     General: No  focal deficit present.     Mental Status: She is alert and oriented to person, place, and time.  Psychiatric:        Mood and Affect: Mood normal.        Behavior: Behavior normal.      LABORATORY DATA:  I have reviewed the labs as listed.  CBC    Component Value Date/Time   WBC 6.2 11/06/2018 0825   RBC 3.49 (L) 11/06/2018 0825   HGB 11.9 (L) 11/06/2018 0825   HCT 36.6 11/06/2018 0825   PLT 326 11/06/2018 0825   MCV 104.9 (H) 11/06/2018 0825   MCH 34.1 (H) 11/06/2018 0825   MCHC 32.5 11/06/2018 0825   RDW 23.0 (H) 11/06/2018 0825   LYMPHSABS 1.9 11/06/2018 0825   MONOABS 0.7 11/06/2018 0825   EOSABS 0.3 11/06/2018 0825   BASOSABS 0.0 11/06/2018 0825   CMP Latest Ref Rng & Units 11/06/2018 10/05/2018 09/22/2018  Glucose 70 - 99 mg/dL 101(H) 112(H) 91  BUN 6 - 20 mg/dL _0 Creatinine 0.44 - 1.00 mg/dL 0.58 0.53 0.65  Sodium 135 - 145 mmol/L 138 139 134(L)  Potassium 3.5 - 5.1 mmol/L 4.0 3.7 4.4  Chloride 98 - 111 mmol/L 105 105 96(L)  CO2 22 - 32 mmol/L _1 Calcium 8.9 - 10.3 mg/dL 9.4 9.2 9.3  Total Protein 6.5 - 8.1 g/dL 6.6 7.2 7.1  Total Bilirubin 0.3 - 1.2 mg/dL 1.0 0.8 0.8  Alkaline  Phos 38 - 126 U/L 84 122 105  AST 15 - 41 U/L 21 18 34  ALT 0 - 44 U/L 17 24 59(H)       DIAGNOSTIC IMAGING:  I have independently reviewed the scans and discussed with the patient.   I have reviewed Venita Lick LPN's note and agree with the documentation.  I personally performed a face-to-face visit, made revisions and my assessment and plan is as follows.    ASSESSMENT & PLAN:   Ovarian cancer, bilateral ([place]) 1.  Clinical stage IVa ovarian cancer, positive cytology of left pleural effusion: -3 cycles of carboplatin and paclitaxel from 08/24/2018 through 10/05/2018. -Germline mutation testing is negative. -Ca1 25 improved to 144 from 1778. -We reviewed results of the CT CAP dated 10/13/2018 which showed decrease in size of bilateral ovarian masses.  Complete  resolution of left pleural effusion.  Significant improvement in the peritoneal and omental disease. -She missed her appointment with Dr. Denman Holt as her son had fatal accident and is recovering. -She wants to hold off on any therapy at this time.  I have asked her to follow-up with Dr. Denman Holt as soon as possible. -We will see her back in 3 weeks for follow-up.  2.  Left lateral chest wall and lower back pain: -She is taking oxycodone 10 mg, 45 tablets daily.  This is well controlled.  3.  Splenic hematoma: -Ligation of splenic artery on 08/01/2018.  Subsequent scan showed stable hematoma.  Hemoglobin has been stable.  4.  Peripheral neuropathy: -Chemotherapy-induced.  She has numbness in the hands at nighttime.  Requires no medical intervention.   Total time spent is 25 minutes with more than 50% of the time spent face-to-face discussing scan results, counseling and coordination of care.  Orders placed this encounter:  Orders Placed This Encounter  Procedures  . CBC with Differential/Platelet  . Comprehensive metabolic panel      Derek Jack, MD [place] [phone]  \

## 2018-11-07 NOTE — Assessment & Plan Note (Signed)
1.  Clinical stage IVa ovarian cancer, positive cytology of left pleural effusion: -3 cycles of carboplatin and paclitaxel from 08/24/2018 through 10/05/2018. -Germline mutation testing is negative. -Ca1 25 improved to 144 from 1778. -We reviewed results of the CT CAP dated 10/13/2018 which showed decrease in size of bilateral ovarian masses.  Complete resolution of left pleural effusion.  Significant improvement in the peritoneal and omental disease. -She missed her appointment with Dr. Denman George as her son had fatal accident and is recovering. -She wants to hold off on any therapy at this time.  I have asked her to follow-up with Dr. Denman George as soon as possible. -We will see her back in 3 weeks for follow-up.  2.  Left lateral chest wall and lower back pain: -She is taking oxycodone 10 mg, 45 tablets daily.  This is well controlled.  3.  Splenic hematoma: -Ligation of splenic artery on 08/01/2018.  Subsequent scan showed stable hematoma.  Hemoglobin has been stable.  4.  Peripheral neuropathy: -Chemotherapy-induced.  She has numbness in the hands at nighttime.  Requires no medical intervention.

## 2018-11-09 ENCOUNTER — Telehealth: Payer: Self-pay | Admitting: *Deleted

## 2018-11-09 NOTE — Telephone Encounter (Signed)
Patient called and rescheduled her appt from October

## 2018-11-13 ENCOUNTER — Telehealth: Payer: Self-pay | Admitting: Oncology

## 2018-11-13 NOTE — Telephone Encounter (Signed)
Called Michelle Holt and discussed that she will be seeing Dr. Denman George on 11/18/18 to talk about having more cycles of chemotherapy before surgery.  Michelle Holt said that she is not able to have any treatment now because her son is in the hospital (step down unit).  He is getting ready to move to rehab soon and she should be able to resume treatment then. She has an appointment to see Dr. Delton Coombes and for infusion on 11/30/18.

## 2018-11-17 ENCOUNTER — Telehealth (HOSPITAL_COMMUNITY): Payer: Self-pay

## 2018-11-17 ENCOUNTER — Other Ambulatory Visit (HOSPITAL_COMMUNITY): Payer: Self-pay | Admitting: Nurse Practitioner

## 2018-11-17 DIAGNOSIS — C563 Malignant neoplasm of bilateral ovaries: Secondary | ICD-10-CM

## 2018-11-17 DIAGNOSIS — C561 Malignant neoplasm of right ovary: Secondary | ICD-10-CM

## 2018-11-17 MED ORDER — OXYCODONE HCL 10 MG PO TABS: 10.0000 mg | ORAL_TABLET | ORAL | 0 refills | Status: DC | PRN

## 2018-11-17 NOTE — Telephone Encounter (Signed)
1024 Pt called for refill on her Oxycodone 10 mg every 4 hours. RLockamy NP notified and script sent in for pt to [facility]. Pt will also call us tomorrow to give [place] updated information on surgery dates after appt with Dr. Denman George tomorrow

## 2018-11-18 ENCOUNTER — Encounter: Payer: Self-pay | Admitting: Gynecologic Oncology

## 2018-11-18 ENCOUNTER — Other Ambulatory Visit: Payer: Self-pay

## 2018-11-18 ENCOUNTER — Inpatient Hospital Stay: Payer: Medicaid Other | Attending: Gynecologic Oncology | Admitting: Gynecologic Oncology

## 2018-11-18 ENCOUNTER — Telehealth: Payer: Self-pay | Admitting: Oncology

## 2018-11-18 VITALS — BP 117/61 | HR 77 | Temp 98.2°F | Resp 17 | Ht 66.0 in | Wt 166.0 lb

## 2018-11-18 DIAGNOSIS — Z79899 Other long term (current) drug therapy: Secondary | ICD-10-CM | POA: Diagnosis not present

## 2018-11-18 DIAGNOSIS — M722 Plantar fascial fibromatosis: Secondary | ICD-10-CM | POA: Insufficient documentation

## 2018-11-18 DIAGNOSIS — M1990 Unspecified osteoarthritis, unspecified site: Secondary | ICD-10-CM | POA: Insufficient documentation

## 2018-11-18 DIAGNOSIS — E7800 Pure hypercholesterolemia, unspecified: Secondary | ICD-10-CM | POA: Diagnosis not present

## 2018-11-18 DIAGNOSIS — Z90722 Acquired absence of ovaries, bilateral: Secondary | ICD-10-CM | POA: Diagnosis not present

## 2018-11-18 DIAGNOSIS — C562 Malignant neoplasm of left ovary: Secondary | ICD-10-CM | POA: Insufficient documentation

## 2018-11-18 DIAGNOSIS — C563 Malignant neoplasm of bilateral ovaries: Secondary | ICD-10-CM

## 2018-11-18 DIAGNOSIS — K589 Irritable bowel syndrome without diarrhea: Secondary | ICD-10-CM | POA: Insufficient documentation

## 2018-11-18 DIAGNOSIS — Z803 Family history of malignant neoplasm of breast: Secondary | ICD-10-CM | POA: Diagnosis not present

## 2018-11-18 DIAGNOSIS — J910 Malignant pleural effusion: Secondary | ICD-10-CM | POA: Diagnosis not present

## 2018-11-18 DIAGNOSIS — G4700 Insomnia, unspecified: Secondary | ICD-10-CM | POA: Insufficient documentation

## 2018-11-18 DIAGNOSIS — Z87891 Personal history of nicotine dependence: Secondary | ICD-10-CM | POA: Diagnosis not present

## 2018-11-18 DIAGNOSIS — C561 Malignant neoplasm of right ovary: Secondary | ICD-10-CM | POA: Insufficient documentation

## 2018-11-18 DIAGNOSIS — Z90710 Acquired absence of both cervix and uterus: Secondary | ICD-10-CM | POA: Diagnosis not present

## 2018-11-18 DIAGNOSIS — Z9221 Personal history of antineoplastic chemotherapy: Secondary | ICD-10-CM | POA: Insufficient documentation

## 2018-11-18 DIAGNOSIS — Z8049 Family history of malignant neoplasm of other genital organs: Secondary | ICD-10-CM | POA: Diagnosis not present

## 2018-11-18 NOTE — Telephone Encounter (Signed)
Michelle Mo, RN with [facility] to reschedule appointments on 11/30/18 to later that week due to it being the anniversary of her husband's death.  [name] said she will have Amy, Caroline call the patient.  Notified [name] and she verbalized understanding.

## 2018-11-18 NOTE — Patient Instructions (Addendum)
Preparing for your Surgery  Plan for surgery on December 29, 2018 with Dr. Everitt Amber at [facility] will be scheduled for a robotic assisted total laparoscopic hysterectomy, bilateral salpingo-oophorectomy, omentectomy, debulking.   Pre-operative Testing -You will receive a phone call from presurgical testing at [facility] if you have not received a call already to arrange for a pre-operative testing appointment before your surgery.  This appointment normally occurs one to two weeks before your scheduled surgery.   -Bring your insurance card, copy of an advanced directive if applicable, medication list  -At that visit, you will be asked to sign a consent for a possible blood transfusion in case a transfusion becomes necessary during surgery.  The need for a blood transfusion is rare but having consent is a necessary part of your care.     -You should not be taking blood thinners or aspirin at least ten days prior to surgery unless instructed by your surgeon.  -Do not take supplements such as fish oil (omega 3), red yeast rice, tumeric before your surgery.  Day Before Surgery at [facility] will be asked to take in a light diet the day before surgery.  Avoid carbonated beverages.  You will be advised to have nothing to eat or drink after midnight the evening before.    Eat a light diet the day before surgery.  Examples including soups, broths, toast, yogurt, mashed potatoes.  Things to avoid include carbonated beverages (fizzy beverages), raw fruits and raw vegetables, or beans.   If your bowels are filled with gas, your surgeon will have difficulty visualizing your pelvic organs which increases your surgical risks.  Your role in recovery Your role is to become active as soon as directed by your doctor, while still giving yourself time to heal.  Rest when you feel tired. You will be asked to do the following in order to speed your recovery:  - Cough and breathe deeply.  This helps toclear and expand your lungs and can prevent pneumonia.  - Do mild physical activity. Walking or moving your legs help your circulation and body functions return to normal. A staff member will help you when you try to walk and will provide you with simple exercises. Do not try to get up or walk alone the first time. - Actively manage your pain. Managing your pain lets you move in comfort. We will ask you to rate your pain on a scale of zero to 10. It is your responsibility to tell your doctor or nurse where and how much you hurt so your pain can be treated.  Special Considerations -If you are diabetic, you may be placed on insulin after surgery to have closer control over your blood sugars to promote healing and recovery.  This does not mean that you will be discharged on insulin.  If applicable, your oral antidiabetics will be resumed when you are tolerating a solid diet.  -Your final pathology results from surgery should be available around one week after surgery and the results will be relayed to you when available.  -Dr. Lahoma Crocker is the surgeon that assists your GYN Oncologist with surgery.  If you end up staying the night, the next day after your surgery you will either see Dr. Denman George or Dr. Lahoma Crocker.  -FMLA forms can be faxed to [phone] and please allow 5-7 business days for completion.  Pain Management After Surgery -Make sure that you have Tylenol at home to use on a  regular basis after surgery for pain control.   -Review the attached handout on narcotic use and their risks and side effects.   Bowel Regimen -You will need to take Sennakot-S to take nightly to prevent constipation especially if you are taking the narcotic pain medication intermittently.  It is important to prevent constipation and drink adequate amounts of liquids.  Blood Transfusion Information WHAT IS A BLOOD TRANSFUSION? A transfusion is the replacement of blood or some of its  parts. Blood is made up of multiple cells which provide different functions.  Red blood cells carry oxygen and are used for blood loss replacement.  White blood cells fight against infection.  Platelets control bleeding.  Plasma helps clot blood.  Other blood products are available for specialized needs, such as hemophilia or other clotting disorders. BEFORE THE TRANSFUSION  Who gives blood for transfusions?   You may be able to donate blood to be used at a later date on yourself (autologous donation).  Relatives can be asked to donate blood. This is generally not any safer than if you have received blood from a stranger. The same precautions are taken to ensure safety when a relative's blood is donated.  Healthy volunteers who are fully evaluated to make sure their blood is safe. This is blood bank blood. Transfusion therapy is the safest it has ever been in the practice of medicine. Before blood is taken from a donor, a complete history is taken to make sure that person has no history of diseases nor engages in risky social behavior (examples are intravenous drug use or sexual activity with multiple partners). The donor's travel history is screened to minimize risk of transmitting infections, such as malaria. The donated blood is tested for signs of infectious diseases, such as HIV and hepatitis. The blood is then tested to be sure it is compatible with you in order to minimize the chance of a transfusion reaction. If you or a relative donates blood, this is often done in anticipation of surgery and is not appropriate for emergency situations. It takes many days to process the donated blood. RISKS AND COMPLICATIONS Although transfusion therapy is very safe and saves many lives, the main dangers of transfusion include:   Getting an infectious disease.  Developing a transfusion reaction. This is an allergic reaction to something in the blood you were given. Every precaution is taken to  prevent this. The decision to have a blood transfusion has been considered carefully by your caregiver before blood is given. Blood is not given unless the benefits outweigh the risks.  AFTER SURGERY INSTRUCTIONS 11/18/2018  Return to work: 4-6 weeks if applicable  Activity: 1. Be up and out of the bed during the day.  Take a nap if needed.  You may walk up steps but be careful and use the hand rail.  Stair climbing will tire you more than you think, you may need to stop part way and rest.   2. No lifting or straining for 6 weeks.  3. No driving for 1 week(s).  Do not drive if you are taking narcotic pain medicine.  4. Shower daily.  Use soap and water on your incision and pat dry; don't rub.  No tub baths until cleared by your surgeon.   5. No sexual activity and nothing in the vagina for 8 weeks.  6. You may experience a small amount of clear drainage from your incisions, which is normal.  If the drainage persists or increases, please call the  office.  7. You may experience vaginal spotting after surgery or around the 6-8 week mark from surgery when the stitches at the top of the vagina begin to dissolve.  The spotting is normal but if you experience heavy bleeding, call our office.  8. Take Tylenol or ibuprofen first for pain and only use narcotic pain medication for severe pain not relieved by the Tylenol or Ibuprofen.  Monitor your Tylenol intake to a max of 4,000 mg.  Diet: 1. Low sodium Heart Healthy Diet is recommended.  2. It is safe to use a laxative, such as Miralax or Colace, if you have difficulty moving your bowels. You can take Sennakot at bedtime every evening to keep bowel movements regular and to prevent constipation.    Wound Care: 1. Keep clean and dry.  Shower daily.  Reasons to call the Doctor:  Fever - Oral temperature greater than 100.4 degrees Fahrenheit  Foul-smelling vaginal discharge  Difficulty urinating  Nausea and vomiting  Increased pain at  the site of the incision that is unrelieved with pain medicine.  Difficulty breathing with or without chest pain  New calf pain especially if only on one side  Sudden, continuing increased vaginal bleeding with or without clots.   Contacts: For questions or concerns you should contact:  Dr. Everitt Amber at [phone]  Joylene John, NP at [phone]  After Hours: call [phone] and have the GYN Oncologist paged/contacted  Enoxaparin injection What is this medicine? ENOXAPARIN (ee nox a PA rin) is used after knee, hip, or abdominal surgeries to prevent blood clotting. It is also used to treat existing blood clots in the lungs or in the veins. This medicine may be used for other purposes; ask your health care provider or pharmacist if you have questions. COMMON BRAND NAME(S): Lovenox What should I tell my health care provider before I take this medicine? They need to know if you have any of these conditions:  bleeding disorders, hemorrhage, or hemophilia  infection of the heart or heart valves  kidney or liver disease  previous stroke  prosthetic heart valve  recent surgery or delivery of a baby  ulcer in the stomach or intestine, diverticulitis, or other bowel disease  an unusual or allergic reaction to enoxaparin, heparin, pork or pork products, other medicines, foods, dyes, or preservatives  pregnant or trying to get pregnant  breast-feeding How should I use this medicine? This medicine is for injection under the skin. It is usually given by a health-care professional. You or a family member may be trained on how to give the injections. If you are to give yourself injections, make sure you understand how to use the syringe, measure the dose if necessary, and give the injection. To avoid bruising, do not rub the site where this medicine has been injected. Do not take your medicine more often than directed. Do not stop taking except on the advice of your doctor or  health care professional. Make sure you receive a puncture-resistant container to dispose of the needles and syringes once you have finished with them. Do not reuse these items. Return the container to your doctor or health care professional for proper disposal. Talk to your pediatrician regarding the use of this medicine in children. Special care may be needed. Overdosage: If you think you have taken too much of this medicine contact a poison control center or emergency room at once. NOTE: This medicine is only for you. Do not share this medicine with others. What if  I miss a dose? If you miss a dose, take it as soon as you can. If it is almost time for your next dose, take only that dose. Do not take double or extra doses. What may interact with this medicine?  aspirin and aspirin-like medicines  certain medicines that treat or prevent blood clots  dipyridamole  NSAIDs, medicines for pain and inflammation, like ibuprofen or naproxen This list may not describe all possible interactions. Give your health care provider a list of all the medicines, herbs, non-prescription drugs, or dietary supplements you use. Also tell them if you smoke, drink alcohol, or use illegal drugs. Some items may interact with your medicine. What should I watch for while using this medicine? Visit your healthcare professional for regular checks on your progress. You may need blood work done while you are taking this medicine. Your condition will be monitored carefully while you are receiving this medicine. It is important not to miss any appointments. If you are going to need surgery or other procedure, tell your healthcare professional that you are using this medicine. Using this medicine for a long time may weaken your bones and increase the risk of bone fractures. Avoid sports and activities that might cause injury while you are using this medicine. Severe falls or injuries can cause unseen bleeding. Be careful when  using sharp tools or knives. Consider using an Copy. Take special care brushing or flossing your teeth. Report any injuries, bruising, or red spots on the skin to your healthcare professional. Wear a medical ID bracelet or chain. Carry a card that describes your disease and details of your medicine and dosage times. What side effects may I notice from receiving this medicine? Side effects that you should report to your doctor or health care professional as soon as possible:  allergic reactions like skin rash, itching or hives, swelling of the face, lips, or tongue  bone pain  signs and symptoms of bleeding such as bloody or black, tarry stools; red or dark-brown urine; spitting up blood or brown material that looks like coffee grounds; red spots on the skin; unusual bruising or bleeding from the eye, gums, or nose  signs and symptoms of a blood clot such as chest pain; shortness of breath; pain, swelling, or warmth in the leg  signs and symptoms of a stroke such as changes in vision; confusion; trouble speaking or understanding; severe headaches; sudden numbness or weakness of the face, arm or leg; trouble walking; dizziness; loss of coordination Side effects that usually do not require medical attention (report to your doctor or health care professional if they continue or are bothersome):  hair loss  pain, redness, or irritation at site where injected This list may not describe all possible side effects. Call your doctor for medical advice about side effects. You may report side effects to FDA at 1-800-FDA-1088. Where should I keep my medicine? Keep out of the reach of children. Store at room temperature between 15 and 30 degrees C (59 and 86 degrees F). Do not freeze. If your injections have been specially prepared, you may need to store them in the refrigerator. Ask your pharmacist. Throw away any unused medicine after the expiration date. NOTE: This sheet is a summary. It may  not cover all possible information. If you have questions about this medicine, talk to your doctor, pharmacist, or health care provider.  2020 [employer] (2016-12-26 11:25:34)

## 2018-11-19 ENCOUNTER — Encounter: Payer: Self-pay | Admitting: Gynecologic Oncology

## 2018-11-19 MED ORDER — ENOXAPARIN SODIUM 40 MG/0.4ML ~~LOC~~ SOLN: 40.0000 mg | SUBCUTANEOUS | 0 refills | Status: DC

## 2018-11-19 NOTE — Addendum Note (Signed)
Addended by: Joylene John D on: 11/19/2018 04:31 PM   Modules accepted: Orders

## 2018-11-19 NOTE — Progress Notes (Signed)
Follow-up Note: Gyn-Onc  Consult was requested by Dr. Delton Coombes for the evaluation of Michelle Holt 48 y.o. female  CC:  Chief Complaint  Patient presents with  . Ovarian cancer, bilateral [facility])    Assessment/Plan:  Michelle Holt  is a 48 y.o.  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 BRCA negative (germline).   Status post 3 cycles of neoadjuvant carboplatin paclitaxel.  Significant treatment delays due to personal issues mid therapy.  I had extensive discussion with Michelle Holt about her prognosis and ideal treatment course.  Her priority is to be able to be available for her son in this next 3 to 4-week.  While he is transitioning from the ICU to rehab.  She did not tolerate chemotherapy particularly well and is worried about side effects, however I explained to her that with more significant delays, we are effectively foregoing an opportunity for cure or enduring remission.  Therefore she is agreeable to having 1 additional cycle of chemotherapy at this time.  Following that we will repeat a CT scan and plan on a surgery in mid December for interval debulking with robotic assisted total hysterectomy BSO omentectomy possible laparotomy.  Following that she will require 3 additional cycles of carboplatin paclitaxel.  I explained the surgery to her including the potential complications and the anticipated recovery.  Based on my exam and review of her last CT scan is a high probability this will be up to be accomplished a minimally invasive route which will optimize her return to both assisting with her son's rehab as well as resuming adjuvant chemotherapy.  HPI: Michelle Holt is a 48 year old P2 who is seen in consultation at the request of Dr.Katragadda for evaluation of stage IV ovarian cancer.  The patient's history began in June 2020 when she experienced symptoms of shortness of breath.  She was seen and evaluated with a chest x-ray which showed bilateral  pleural effusions and she underwent thoracentesis on July 07, 2018.  This revealed pleural fluid which was positive for adenocarcinoma favor GYN primary due to cytokeratin 7+ p53 positive WT 1+ PAX 8+ and weak ER positive.  CK20 was negative.  Post drainage she developed back pain.  A CT scan of the chest on July 09, 2018 which showed a moderate loculated left hydropneumothorax with a small air component and moderate fluid component.  The largest loculated component was located posteriorly.  There is a pigtail drainage catheter in the lateral space.  No obvious etiology was appreciated such as obvious mass or pleural disease.  There was also a small right pleural effusion with associated atelectasis.  It was of uncertain significance.  Due to the epigastric pain that she was continuing to experience, she underwent a CT scan of the abdomen and pelvis on July 10, 2018.  This revealed bilateral ovaries enlarged by heterogeneous appearing cystic lesions measuring 5.3 x 4.2 cm on the right, and 4.5 x 3.2 cm on the left.  No other masses, lymphadenopathy was seen.  There was trace ascites.  There was some suggestion of omental and peritoneal nodularity concerning for peritoneal metastatic disease.  She underwent VATS procedure and left pleural peeling.  This showed fibrinous pleuritis negative for malignancy.  This was performed on July 13, 2018.  CA 125 on 07/19/18 was elevated at 581.   An EGD was performed on July 20, 2018.  This showed a nonbleeding gastric ulcer, duodenal erosions without bleeding, and a normal esophagus.  It  was not felt to be the cause of a worsening anemia that was diagnosed at that time.  A PET on 07/31/18 showed hypermetabolic tissue associated with cystic masses of the LEFT and RIGHT ovary consistent with ovarian carcinoma. Evidence of carcinoma metastasis with rind of hypermetabolic tissue along the inferior margin of liver RIGHT hepatic lobe as well as on along the ventral LEFT  peritoneal space and greater omentum. Hypermetabolic tissue in the LEFT pleural space is related to talc pleurodesis. Cannot exclude underlying malignancy. Incidental finding of a large hematoma in LEFT upper quadrant bound superiorly by the LEFT hemidiaphragm and inferiorly by the spleen.   She had persistent left upper quadrant and epigastric discomfort and was readmitted.  She was eventually diagnosed with a splenic laceration thought to be secondary to her thoracentesis or VATS procedure.  She underwent splenic artery embolization in July 2020.  Since that time she has had worsening left upper quadrant pains.  Of note a thoracentesis had been placed in June on the left.  It is remained in place since that time but stopped draining fluid in late July 2020.  She met with Dr.Katragadda who counseled her regarding the options of neoadjuvant chemotherapy followed by interval cytoreduction and additional chemotherapy.  The patient was initially cautious about undergoing aggressive chemotherapy as her husband died in 2016-03-15 from advanced malignancy and she was concerned about quality of life diminution.  Her past medical history is scanty as she has had infrequent medical care.  She does report a history of hypercholesterolemia.  She had 2 prior vaginal births.  She denies uterine procedures other than an endometrial ablation for benign disease in Mar 16, 2003 and has been amenorrheic since that time.  She has had a cholecystectomy.  She had no other abdominal surgeries.  Her family history is significant for her being 1 of 12 children.  She has a maternal aunt who has a history of breast cancer, and a sister who was diagnosed with uterine cancer at age 69, she was overweight, it was a stage I disease.  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  She is not walking while receiving treatment for her cancer.  Interval Hx:   CA 125 on 08/24/18 prior to commencing treatment was [birth year].   She commenced neoadjuvant  chemotherapy on 08/24/18. She received 3 cycles with cycle 3 on 10/05/18. She found chemotherapy very difficult with nausea and profound fatigue. Immediately following cycle 3, her son was involved in an ATV accident and suffered a severe brain injury and was in the ICU for 2 months. During that time the patient cancelled her planned interval debulking date and elected for a hold on chemotherapy so that she could be available to travel regularly to [place] to be with him.  CT scan on 10/13/18 following cycle 3 showed near complete interval resolution of a previously seen left pleural effusion, now trace, with residual pleural thickening. Interval decrease in size of complex cystic lesions of the  ovaries, a right-sided lesion measuring 4.6 x 3.6 cm, previously 5.8 x 4.5 cm when measured similarly and a left-sided lesion measuring 4.6 x 3.8 cm,previously 4.7 x 4.7 cm when measured similarly.There is very faint residual peritoneal and omental thickening and stranding, which is improved when compared to baseline PET-CT examination dated 07/31/2018.  CA 125 on 11/06/18 was 144.   Genetic testing was negative for germline BRCA mutations.   Current Meds:  Outpatient Encounter Medications as of 11/18/2018  Medication Sig  . dexamethasone (  DECADRON) 4 MG tablet TAKE ONE TABLET BY MOUTH DAILY.  . diazepam (VALIUM) 5 MG tablet Take 1 tablet (5 mg total) by mouth at bedtime.  . dicyclomine (BENTYL) 10 MG capsule TAKE ONE CAPSULE BY MOUTH FOUR TIMES DAILY. TAKE BEFORE MEALS AND AT BEDTIME.  Marland Kitchen lidocaine-prilocaine (EMLA) cream Apply small amount to port a cath site and cover with plastic wrap one hour prior to chemotherapy appointments  . ondansetron (ZOFRAN ODT) 8 MG disintegrating tablet Take 1 tablet (8 mg total) by mouth every 8 (eight) hours as needed for nausea or vomiting.  . Oxycodone HCl 10 MG TABS Take 1 tablet (10 mg total) by mouth every 4 (four) hours as needed.  . pantoprazole (PROTONIX) 40 MG tablet  Take 1 tablet (40 mg total) by mouth 2 (two) times daily before a meal.  . polyethylene glycol (MIRALAX / GLYCOLAX) 17 g packet Take 17 g by mouth as needed.  . promethazine (PHENERGAN) 25 MG tablet Take 1 tablet (25 mg total) by mouth every 6 (six) hours as needed for nausea, vomiting or refractory nausea / vomiting.  . SUMAtriptan (IMITREX) 100 MG tablet TAKE ONE TABLET BY MOUTH PRN UP to TWICE DAILY AS NEEDED.  Marland Kitchen CARBOPLATIN IV Inject into the vein every 21 ( twenty-one) days.  Marland Kitchen PACLITAXEL IV Inject into the vein every 21 ( twenty-one) days.   No facility-administered encounter medications on file as of 11/18/2018.     Allergy:  Allergies  Allergen Reactions  . Morphine And Related Itching  . Nortriptyline Other (See Comments)    Significant weight gain  . Topamax [Topiramate] Diarrhea    nausea  . Actifed Cold-Allergy [Chlorpheniramine-Phenyleph Er] Rash  . Amoxicillin Rash    Did it involve swelling of the face/tongue/throat, SOB, or low BP? Unknown Did it involve sudden or severe rash/hives, skin peeling, or any reaction on the inside of your mouth or nose? Unknown Did you need to seek medical attention at a hospital or doctor's office? Unknown When did it last happen? teenager If all above answers are "NO", may proceed with cephalosporin use.   . Codeine Hives  . Erythromycin Rash  . Penicillins Rash    Did it involve swelling of the face/tongue/throat, SOB, or low BP? Unknown Did it involve sudden or severe rash/hives, skin peeling, or any reaction on the inside of your mouth or nose? Unknown Did you need to seek medical attention at a hospital or doctor's office? Unknown When did it last happen? teenager If all above answers are "NO", may proceed with cephalosporin use.   Michelle Holt [Pseudoephedrine Hcl] Rash    Social Hx:   Social History   Socioeconomic History  . Marital status: Widowed    Spouse name: Not on file  . Number of children: 2  . Years  of education: 2-College  . Highest education level: Not on file  Occupational History    Employer: [employer]  . Financial resource strain: Not hard at all  . Food insecurity    Worry: Never true    Inability: Never true  . Transportation needs    Medical: No    Non-medical: No  Tobacco Use  . Smoking status: Former Smoker    Packs/day: 0.50    Years: 17.00    Pack years: 8.50    Types: Cigarettes    Quit date: 06/22/2018    Years since quitting: 0.4  . Smokeless tobacco: Never Used  Substance and Sexual Activity  .  Alcohol use: Yes    Alcohol/week: 1.0 standard drinks    Types: 1 Glasses of wine per week    Comment: Drinks alcohol on occasion  . Drug use: No  . Sexual activity: Not on file  Lifestyle  . Physical activity    Days per week: 0 days    Minutes per session: 0 min  . Stress: Very much  Relationships  . Social connections    Talks on phone: More than three times a week    Gets together: More than three times a week    Attends religious service: 1 to 4 times per year    Active member of club or organization: No    Attends meetings of clubs or organizations: Never    Relationship status: Widowed  . Intimate partner violence    Fear of current or ex partner: No    Emotionally abused: No    Physically abused: No    Forced sexual activity: No  Other Topics Concern  . Not on file  Social History Narrative   Patient lives at home with her daughter.    Patient has 2 children.    Patient is widowed.    Patient is right handed.    Patient has her Associates degree.       Past Surgical Hx:  Past Surgical History:  Procedure Laterality Date  . CHOLECYSTECTOMY  2008  . COLONOSCOPY N/A 08/13/2013   Procedure: COLONOSCOPY;  Surgeon: Rogene Houston, MD;  [facility];  Service: Endoscopy;  Laterality: N/A;  230-moved to 145 Ann to notify pt  . ESOPHAGOGASTRODUODENOSCOPY    . ESOPHAGOGASTRODUODENOSCOPY (EGD) WITH PROPOFOL N/A 07/20/2018    Procedure: ESOPHAGOGASTRODUODENOSCOPY (EGD) WITH PROPOFOL;  Surgeon: Rogene Houston, MD;  [facility];  Service: Endoscopy;  Laterality: N/A;  Possible esophageal dilation.  . IR ANGIOGRAM SELECTIVE EACH ADDITIONAL VESSEL  08/01/2018  . IR ANGIOGRAM VISCERAL SELECTIVE  08/01/2018  . IR EMBO ART  VEN HEMORR LYMPH EXTRAV  INC GUIDE ROADMAPPING  08/01/2018  . IR IMAGING GUIDED PORT INSERTION  08/20/2018  . IR PERC PLEURAL DRAIN W/INDWELL CATH W/IMG GUIDE  07/08/2018  . IR THORACENTESIS ASP PLEURAL SPACE W/IMG GUIDE  07/07/2018  . IR US GUIDE VASC ACCESS RIGHT  08/01/2018  . PLEURAL EFFUSION DRAINAGE Left 07/13/2018   Procedure: DRAINAGE OF LOCULATED PLEURAL EFFUSION;  Surgeon: Ivin Poot, MD;  Location: [place];  Service: Thoracic;  Laterality: Left;  . REMOVAL OF PLEURAL DRAINAGE CATHETER Left 08/20/2018   Procedure: REMOVAL OF PLEURAL DRAINAGE CATHETER;  Surgeon: Ivin Poot, MD;  Location: [place];  Service: Thoracic;  Laterality: Left;  . REMOVAL OF PLEURAL DRAINAGE CATHETER Left 08/20/2018   Procedure: REMOVAL OF PLEURAL DRAINAGE CATHETER;  Surgeon: Ivin Poot, MD;  Location: [place];  Service: Thoracic;  Laterality: Left;  . TALC PLEURODESIS Left 07/13/2018   Procedure: Talc Pleuradesis;  Surgeon: Prescott Gum, Collier Salina, MD;  Location: [place];  Service: Thoracic;  Laterality: Left;  . TUBAL LIGATION Bilateral   . UTERINE ABLATION    . VIDEO ASSISTED THORACOSCOPY Left 07/13/2018   Procedure: VIDEO ASSISTED THORACOSCOPY;  Surgeon: Ivin Poot, MD;  Location: [place];  Service: Thoracic;  Laterality: Left;    Past Medical Hx:  Past Medical History:  Diagnosis Date  . Anxiety and depression   . Arthritis of facet joints at multiple vertebral levels    L5-S1  . Constipation   . Dyslipidemia   .  Family history of breast cancer   . Family history of uterine cancer   . Insomnia   . Irritable bowel syndrome   . Migraine   . Muscle tension headache   . Ovarian carcinoma ([place])   .  Plantar fasciitis of right foot   . Port-A-Cath in place 08/20/2018    Past Gynecological History:  See HPI No LMP recorded. Patient has had an ablation.  Family Hx:  Family History  Problem Relation Age of Onset  . Hypertension Mother   . Obesity Mother   . Diabetes Mother   . Kidney disease Mother   . Peripheral vascular disease Father   . Atrial fibrillation Father   . COPD Brother   . Osteoporosis Brother   . Crohn's disease Sister   . Uterine cancer Sister 14       maternal half sister  . Breast cancer Maternal Aunt 71  . Colon cancer Neg Hx     Review of Systems:  Constitutional  Feels well,    ENT Normal appearing ears and nares bilaterally Skin/Breast  No rash, sores, jaundice, itching, dryness Cardiovascular  No chest pain, shortness of breath, or edema  Pulmonary  No cough or wheeze.  Gastro Intestinal  No nausea, vomitting, or diarrhoea. No bright red blood per rectum, no abdominal pain, change in bowel movement, or constipation.  Genito Urinary  No frequency, urgency, dysuria, no abnormal bleeding Musculo Skeletal  No myalgia, arthralgia, joint swelling or pain  Neurologic  No weakness, numbness, change in gait,  Psychology  No depression, anxiety, insomnia.   Vitals:  Blood pressure 117/61, pulse 77, temperature 98.2 F (36.8 C), temperature source Temporal, resp. rate 17, height 5' 6" (1.676 m), weight 166 lb (75.3 kg), SpO2 100 %.  Physical Exam: WD in NAD Neck  Supple NROM, without any enlargements.  Lymph Node Survey No cervical supraclavicular or inguinal adenopathy Cardiovascular  Pulse normal rate, regularity and rhythm. S1 and S2 normal.  Lungs  Clear to auscultation bilateraly, without wheezes/crackles/rhonchi. Good air movement.  Skin  No rash/lesions/breakdown  Psychiatry  Alert and oriented to person, place, and time  Abdomen  Normoactive bowel sounds, abdomen soft, non-tender and non without evidence of hernia.  Back No CVA  tenderness Genito Urinary  Vulva/vagina: Normal external female genitalia.  No lesions. No discharge or bleeding.  Bladder/urethra:  No lesions or masses, well supported bladder  Vagina: normal  Cervix: Normal appearing, no lesions.  Uterus:  Small, mobile, no parametrial involvement or nodularity.  Adnexa: no palpable masses. Rectal  Good tone, no masses no cul de sac nodularity.  Extremities  No bilateral cyanosis, clubbing or edema.   Thereasa Solo, MD  11/19/2018, 7:18 AM

## 2018-11-20 ENCOUNTER — Other Ambulatory Visit: Payer: Self-pay | Admitting: Gynecologic Oncology

## 2018-11-20 DIAGNOSIS — C563 Malignant neoplasm of bilateral ovaries: Secondary | ICD-10-CM

## 2018-11-20 DIAGNOSIS — C561 Malignant neoplasm of right ovary: Secondary | ICD-10-CM

## 2018-11-23 ENCOUNTER — Other Ambulatory Visit (HOSPITAL_COMMUNITY): Payer: Self-pay | Admitting: *Deleted

## 2018-11-23 MED ORDER — SENNA 8.6 MG PO TABS: 1.0000 | ORAL_TABLET | Freq: Every day | ORAL | 0 refills | Status: DC | PRN

## 2018-11-23 NOTE — Progress Notes (Signed)
Rx Senokot daily prn for constipation.

## 2018-11-30 ENCOUNTER — Other Ambulatory Visit (HOSPITAL_COMMUNITY): Payer: Self-pay | Admitting: Hematology

## 2018-11-30 ENCOUNTER — Telehealth (HOSPITAL_COMMUNITY): Payer: Self-pay | Admitting: Hematology

## 2018-11-30 ENCOUNTER — Other Ambulatory Visit (HOSPITAL_COMMUNITY): Payer: Medicaid Other

## 2018-11-30 ENCOUNTER — Ambulatory Visit (HOSPITAL_COMMUNITY): Payer: Medicaid Other | Admitting: Hematology

## 2018-11-30 ENCOUNTER — Ambulatory Visit (HOSPITAL_COMMUNITY): Payer: Medicaid Other

## 2018-11-30 DIAGNOSIS — C561 Malignant neoplasm of right ovary: Secondary | ICD-10-CM

## 2018-11-30 DIAGNOSIS — C563 Malignant neoplasm of bilateral ovaries: Secondary | ICD-10-CM

## 2018-12-01 ENCOUNTER — Other Ambulatory Visit: Payer: Self-pay

## 2018-12-01 ENCOUNTER — Encounter (HOSPITAL_COMMUNITY): Payer: Self-pay

## 2018-12-01 ENCOUNTER — Encounter (HOSPITAL_COMMUNITY): Payer: Self-pay | Admitting: Hematology

## 2018-12-01 ENCOUNTER — Inpatient Hospital Stay (HOSPITAL_COMMUNITY): Payer: Medicaid Other | Attending: Hematology | Admitting: Hematology

## 2018-12-01 ENCOUNTER — Inpatient Hospital Stay (HOSPITAL_COMMUNITY): Payer: Medicaid Other

## 2018-12-01 VITALS — BP 110/57 | HR 83 | Temp 96.8°F | Resp 18

## 2018-12-01 DIAGNOSIS — Z5111 Encounter for antineoplastic chemotherapy: Secondary | ICD-10-CM | POA: Diagnosis present

## 2018-12-01 DIAGNOSIS — Z8249 Family history of ischemic heart disease and other diseases of the circulatory system: Secondary | ICD-10-CM | POA: Insufficient documentation

## 2018-12-01 DIAGNOSIS — Z87891 Personal history of nicotine dependence: Secondary | ICD-10-CM | POA: Insufficient documentation

## 2018-12-01 DIAGNOSIS — C563 Malignant neoplasm of bilateral ovaries: Secondary | ICD-10-CM

## 2018-12-01 DIAGNOSIS — G620 Drug-induced polyneuropathy: Secondary | ICD-10-CM | POA: Diagnosis not present

## 2018-12-01 DIAGNOSIS — Z833 Family history of diabetes mellitus: Secondary | ICD-10-CM | POA: Diagnosis not present

## 2018-12-01 DIAGNOSIS — T451X5A Adverse effect of antineoplastic and immunosuppressive drugs, initial encounter: Secondary | ICD-10-CM | POA: Diagnosis not present

## 2018-12-01 DIAGNOSIS — K581 Irritable bowel syndrome with constipation: Secondary | ICD-10-CM | POA: Diagnosis not present

## 2018-12-01 DIAGNOSIS — Z8049 Family history of malignant neoplasm of other genital organs: Secondary | ICD-10-CM | POA: Diagnosis not present

## 2018-12-01 DIAGNOSIS — Z79899 Other long term (current) drug therapy: Secondary | ICD-10-CM | POA: Insufficient documentation

## 2018-12-01 DIAGNOSIS — C561 Malignant neoplasm of right ovary: Secondary | ICD-10-CM | POA: Diagnosis not present

## 2018-12-01 DIAGNOSIS — F418 Other specified anxiety disorders: Secondary | ICD-10-CM | POA: Insufficient documentation

## 2018-12-01 DIAGNOSIS — R109 Unspecified abdominal pain: Secondary | ICD-10-CM | POA: Insufficient documentation

## 2018-12-01 DIAGNOSIS — C562 Malignant neoplasm of left ovary: Secondary | ICD-10-CM | POA: Diagnosis not present

## 2018-12-01 DIAGNOSIS — Z803 Family history of malignant neoplasm of breast: Secondary | ICD-10-CM | POA: Diagnosis not present

## 2018-12-01 DIAGNOSIS — Z95828 Presence of other vascular implants and grafts: Secondary | ICD-10-CM

## 2018-12-01 LAB — CBC WITH DIFFERENTIAL/PLATELET
Abs Immature Granulocytes: 0.03 10*3/uL (ref 0.00–0.07)
Basophils Absolute: 0 10*3/uL (ref 0.0–0.1)
Basophils Relative: 1 %
Eosinophils Absolute: 0.3 10*3/uL (ref 0.0–0.5)
Eosinophils Relative: 4 %
HCT: 40.9 % (ref 36.0–46.0)
Hemoglobin: 13.4 g/dL (ref 12.0–15.0)
Immature Granulocytes: 1 %
Lymphocytes Relative: 29 %
Lymphs Abs: 1.8 10*3/uL (ref 0.7–4.0)
MCH: 36.2 pg — ABNORMAL HIGH (ref 26.0–34.0)
MCHC: 32.8 g/dL (ref 30.0–36.0)
MCV: 110.5 fL — ABNORMAL HIGH (ref 80.0–100.0)
Monocytes Absolute: 0.6 10*3/uL (ref 0.1–1.0)
Monocytes Relative: 10 %
Neutro Abs: 3.4 10*3/uL (ref 1.7–7.7)
Neutrophils Relative %: 55 %
Platelets: 385 10*3/uL (ref 150–400)
RBC: 3.7 MIL/uL — ABNORMAL LOW (ref 3.87–5.11)
RDW: 15.9 % — ABNORMAL HIGH (ref 11.5–15.5)
WBC: 6.2 10*3/uL (ref 4.0–10.5)
nRBC: 0 % (ref 0.0–0.2)

## 2018-12-01 LAB — COMPREHENSIVE METABOLIC PANEL
ALT: 26 U/L (ref 0–44)
AST: 30 U/L (ref 15–41)
Albumin: 3.8 g/dL (ref 3.5–5.0)
Alkaline Phosphatase: 91 U/L (ref 38–126)
Anion gap: 9 (ref 5–15)
BUN: 17 mg/dL (ref 6–20)
CO2: 25 mmol/L (ref 22–32)
Calcium: 9.5 mg/dL (ref 8.9–10.3)
Chloride: 104 mmol/L (ref 98–111)
Creatinine, Ser: 0.58 mg/dL (ref 0.44–1.00)
GFR calc Af Amer: >60 mL/min (ref >60)
GFR calc non Af Amer: >60 mL/min (ref >60)
Glucose, Bld: 104 mg/dL — ABNORMAL HIGH (ref 70–99)
Potassium: 3.5 mmol/L (ref 3.5–5.1)
Sodium: 138 mmol/L (ref 135–145)
Total Bilirubin: 0.7 mg/dL (ref 0.3–1.2)
Total Protein: 6.7 g/dL (ref 6.5–8.1)

## 2018-12-01 MED ORDER — DICYCLOMINE HCL 10 MG PO CAPS: 10.0000 mg | ORAL_CAPSULE | Freq: Three times a day (TID) | ORAL | 2 refills | Status: DC

## 2018-12-01 MED ORDER — SODIUM CHLORIDE 0.9 % IV SOLN
736.8000 mg | Freq: Once | INTRAVENOUS | Status: AC
  Administered 2018-12-01: 740 mg via INTRAVENOUS
  Filled 2018-12-01: qty 74

## 2018-12-01 MED ORDER — SODIUM CHLORIDE 0.9 % IV SOLN
Freq: Once | INTRAVENOUS | Status: AC
  Administered 2018-12-01: 10:00:00 via INTRAVENOUS

## 2018-12-01 MED ORDER — FAMOTIDINE IN NACL 20-0.9 MG/50ML-% IV SOLN
20.0000 mg | Freq: Once | INTRAVENOUS | Status: AC
  Administered 2018-12-01: 20 mg via INTRAVENOUS
  Filled 2018-12-01: qty 50

## 2018-12-01 MED ORDER — PALONOSETRON HCL INJECTION 0.25 MG/5ML
0.2500 mg | Freq: Once | INTRAVENOUS | Status: AC
  Administered 2018-12-01: 0.25 mg via INTRAVENOUS
  Filled 2018-12-01: qty 5

## 2018-12-01 MED ORDER — SODIUM CHLORIDE 0.9 % IV SOLN
Freq: Once | INTRAVENOUS | Status: AC
  Administered 2018-12-01: 10:00:00 via INTRAVENOUS
  Filled 2018-12-01: qty 5

## 2018-12-01 MED ORDER — DIPHENHYDRAMINE HCL 50 MG/ML IJ SOLN
50.0000 mg | Freq: Once | INTRAMUSCULAR | Status: AC
  Administered 2018-12-01: 50 mg via INTRAVENOUS
  Filled 2018-12-01: qty 1

## 2018-12-01 MED ORDER — HEPARIN SOD (PORK) LOCK FLUSH 100 UNIT/ML IV SOLN
500.0000 [IU] | Freq: Once | INTRAVENOUS | Status: AC | PRN
  Administered 2018-12-01: 500 [IU]

## 2018-12-01 MED ORDER — SODIUM CHLORIDE 0.9 % IV SOLN
175.0000 mg/m2 | Freq: Once | INTRAVENOUS | Status: AC
  Administered 2018-12-01: 324 mg via INTRAVENOUS
  Filled 2018-12-01: qty 54

## 2018-12-01 MED ORDER — SODIUM CHLORIDE 0.9% FLUSH
10.0000 mL | INTRAVENOUS | Status: DC | PRN
  Administered 2018-12-01: 10 mL
  Filled 2018-12-01: qty 10

## 2018-12-01 NOTE — Patient Instructions (Signed)
[facility] at [facility] Discharge Instructions  You were seen today by Dr. Katragadda. He went over your recent lab results. He will see you back in 3 weeks for labs and follow up.   Thank you for choosing [facility] at [facility] to provide your oncology and hematology care.  To afford each patient quality time with our provider, please arrive at least 15 minutes before your scheduled appointment time.   If you have a lab appointment with the Cancer Center please come in thru the  Main Entrance and check in at the main information desk  You need to re-schedule your appointment should you arrive 10 or more minutes late.  We strive to give you quality time with our providers, and arriving late affects you and other patients whose appointments are after yours.  Also, if you no show three or more times for appointments you may be dismissed from the clinic at the providers discretion.     Again, thank you for choosing [facility].  Our hope is that these requests will decrease the amount of time that you wait before being seen by our physicians.       _____________________________________________________________  Should you have questions after your visit to [facility], please contact our office at [phone] between the hours of 8:00 a.m. and 4:30 p.m.  Voicemails left after 4:00 p.m. will not be returned until the following business day.  For prescription refill requests, have your pharmacy contact our office and allow 72 hours.    Cancer Center Support Programs:   > Cancer Support Group  2nd Tuesday of the month 1pm-2pm, Journey Room    

## 2018-12-01 NOTE — Assessment & Plan Note (Signed)
1.  Clinical stage IVa ovarian cancer, positive cytology of left pleural effusion: -3 cycles of carboplatin and paclitaxel from 08/24/2018 through 10/05/2018. -Germline mutation testing is negative. -Ca1 25 improved to 144 from 1778. -CT CAP on 10/13/2018 showed decrease in size of bilateral ovarian masses.  Complete resolution of left pleural effusion.  Significant improvement in the peritoneal and omental disease. -Further treatments were delayed secondary to her son involved in a fatal accident. -She met with Dr. Denman George on 11/28/2018.  She was told to proceed with 1 more cycle of chemotherapy followed by CT scan and surgical debulking in mid December.  She will have 3 more cycles after surgery. -Today she is feeling well.  We reviewed her labs.  We will proceed with cycle 4 of chemotherapy. -We will see her back in 3 weeks for follow-up.  We will schedule for CT scans 1 day prior to next visit.  2.  Left lateral chest wall and abdominal pain: -She is taking oxycodone 10 mg, 4 to 5 tablets daily.  3.  Splenic hematoma: -Ligation of splenic artery on 08/01/2018.  Subsequent scan showed stable hematoma.  Hemoglobin has been stable.  4.  Peripheral neuropathy: -She has mild numbness in the hands at nighttime, chemotherapy-induced.  Requires no medical intervention.

## 2018-12-01 NOTE — Patient Instructions (Signed)
[facility] Discharge Instructions for Patients Receiving Chemotherapy  Today you received the following chemotherapy agents   To help prevent nausea and vomiting after your treatment, we encourage you to take your nausea medication   If you develop nausea and vomiting that is not controlled by your nausea medication, call the clinic.   BELOW ARE SYMPTOMS THAT SHOULD BE REPORTED IMMEDIATELY:  *FEVER GREATER THAN 100.5 F  *CHILLS WITH OR WITHOUT FEVER  NAUSEA AND VOMITING THAT IS NOT CONTROLLED WITH YOUR NAUSEA MEDICATION  *UNUSUAL SHORTNESS OF BREATH  *UNUSUAL BRUISING OR BLEEDING  TENDERNESS IN MOUTH AND THROAT WITH OR WITHOUT PRESENCE OF ULCERS  *URINARY PROBLEMS  *BOWEL PROBLEMS  UNUSUAL RASH Items with * indicate a potential emergency and should be followed up as soon as possible.  Feel free to call the clinic should you have any questions or concerns. The clinic phone number is [phone].  Please show the CHEMO ALERT CARD at check-in to the Emergency Department and triage nurse.   

## 2018-12-01 NOTE — Progress Notes (Signed)
Michelle Holt, Michelle Holt [id number]   CLINIC:  Medical Oncology/Hematology  PCP:  Michelle Chroman, MD [place] [address] [phone]   REASON FOR VISIT:  Follow-up for ovarian cancer, splenic hematoma.   BRIEF ONCOLOGIC HISTORY:  Oncology History  Ovarian cancer, bilateral ([place])  07/07/2018 Pathology Results   PLEURAL FLUID, LEFT (SPECIMEN 1 OF 1, COLLECTED 07/07/18): - MALIGNANT CELLS CONSISTENT WITH ADENOCARCINOMA - SEE COMMENT  Source Pleural Fluid, (Specimen 1 of 1, collected on 07/07/2018) Gross Specimen: Received is/are 1000cc of bloody red fluid with tissue. (TC:tc) Prepared: # Smears: 0 # Concentration Technique Slides (i.e. ThinPrep): 1 # Cell Block: 1 Conventional Additional Studies: Two Hematology slides labeled [id number] Comment The malignant cells are positive for cytokeratin 7, p53, WT-1, Pax-8, Moc31, ER (weak) and EMA but negative for cytokeratin 20, TTF-1, GATA-3, CDX-2 and D2-40. Overall, the phenotype is consistent with a gynecologic primary; clinical correlation recommended.   07/07/2018 Procedure   Successful ultrasound guided left thoracentesis yielding 2.0 L of pleural fluid   07/08/2018 Procedure   1. Technically successful placement of left 14 French pigtail chest drain, placed to Pleur-evac water-seal.   07/08/2018 Procedure   1. Technically successful five [place] double lumen power injectable PICC placement   07/09/2018 Imaging   Ct chest 1. There is a moderate, loculated left hydropneumothorax with a small air component and moderate fluid component. The largest loculated component is located posteriorly. There is a pigtail drainage catheter about the lateral pleural space. There is no obvious etiology, such as obvious mass or pleural disease.   2. There is a small right pleural effusion with associated atelectasis or consolidation and a subpleural consolidation of the superior segment right lower lobe (series  4, image 56), of uncertain significance, possibly infectious or inflammatory   07/10/2018 Imaging   Ct abdomen and pelvis: 1. The bilateral ovaries are enlarged by heterogeneous appearing cystic lesions, measuring 5.3 x 4.2 cm on the right (series 4, image 72) and 4.5 x 3.2 cm on the left (series 4, image 75). Consider dedicated pelvic ultrasound and/or pelvic MRI to further evaluate for solid components given high suspicion for GYN primary malignancy.   2. No other evidence of mass and no lymphadenopathy in the abdomen or pelvis.   3. Trace ascites. There is some suggestion of omental and peritoneal nodularity (e.g. Series 4, image 40), concerning for peritoneal metastatic disease.    4. Loculated left-sided pleural effusion with left-sided pleural drainage catheter in position. Small right pleural effusion   07/13/2018 Surgery   OPERATION: 1.  Left VATS (video-assisted thoracoscopic surgery) for drainage of loculated pleural effusion. 2.  Talc pleurodesis for malignant pleural effusion. 3.  Placement of PleurX catheter for management of malignant pleural effusion. 4.  Placement of On-Q analgesia catheter system.    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   07/13/2018 Pathology Results   Pleura, peel, Left Pleural - FIBRO-FIBRINOUS PLEURITIS - NEGATIVE FOR MALIGNANCY   07/18/2018 Initial Diagnosis   Ovarian cancer, bilateral ([place])   07/20/2018 Procedure   EGD impression: Normal proximal esophagus and mid esophagus. Mild distal esophageal rings; dilation not performed because of esophagitis. LA Grade C reflux esophagitis. Z-line regular, 30 cm from the incisors. 5 cm hiatal hernia. Non-bleeding gastric ulcer with no stigmata of bleeding. Gastritis.  Duodenal erosions without bleeding. Normal  second portion of the duodenum. No specimens collected.   08/19/2018 Genetic Testing   Negative genetic testing on the common hereditary cancer panel.  The Common Hereditary Gene Panel offered by [facility] includes sequencing and/or deletion duplication testing of the following 48 genes: APC, ATM, AXIN2, BARD1, BMPR1A, BRCA1, BRCA2, BRIP1, CDH1, CDK4, CDKN2A (p14ARF), CDKN2A (p16INK4a), CHEK2, CTNNA1, DICER1, EPCAM (Deletion/duplication testing only), GREM1 (promoter region deletion/duplication testing only), KIT, MEN1, MLH1, MSH2, MSH3, MSH6, MUTYH, NBN, NF1, NHTL1, PALB2, PDGFRA, PMS2, POLD1, POLE, PTEN, RAD50, RAD51C, RAD51D, RNF43, SDHB, SDHC, SDHD, SMAD4, SMARCA4. STK11, TP53, TSC1, TSC2, and VHL.  The following genes were evaluated for sequence changes only: SDHA and HOXB13 c.251G>A variant only. The report date is August 19, 2018.   08/24/2018 -  Chemotherapy   The patient had palonosetron (ALOXI) injection 0.25 mg, 0.25 mg, Intravenous,  Once, 4 of 6 cycles Administration: 0.25 mg (08/24/2018), 0.25 mg (09/14/2018), 0.25 mg (10/05/2018), 0.25 mg (12/01/2018) CARBOplatin (PARAPLATIN) 740 mg in sodium chloride 0.9 % 250 mL chemo infusion, 740 mg (100 % of original dose 742.8 mg), Intravenous,  Once, 4 of 6 cycles Dose modification:   (original dose 742.8 mg, Cycle 1),   (original dose 736.8 mg, Cycle 2),   (original dose 736.8 mg, Cycle 3),   (original dose 736.8 mg, Cycle 4) Administration: 740 mg (08/24/2018), 740 mg (09/14/2018), 740 mg (10/05/2018), 740 mg (12/01/2018) PACLitaxel (TAXOL) 324 mg in sodium chloride 0.9 % 500 mL chemo infusion (> 47m/m2), 175 mg/m2 = 324 mg, Intravenous,  Once, 4 of 6 cycles Administration: 324 mg (08/24/2018), 324 mg (09/14/2018), 324 mg (10/05/2018), 324 mg (12/01/2018) fosaprepitant (EMEND) 150 mg, dexamethasone (DECADRON) 12 mg in sodium chloride 0.9 % 145 mL IVPB, , Intravenous,  Once, 4 of 6 cycles Administration:  (08/24/2018),  (09/14/2018),  (10/05/2018),  (12/01/2018)   for chemotherapy treatment.       CANCER STAGING: Cancer Staging No matching staging information was found for the patient.   INTERVAL HISTORY:  [name]89y.o. female seen for follow-up of ovarian cancer.  Her chemotherapy was held in the past few months secondary to her son involved in motor vehicle accident.  She is busy attending to him at this time.  She was evaluated by Dr. RDenman Georgeon the 14th of this month.  She was recommended to proceed with 1 more cycle of chemotherapy prior to restaging CT scan and debulking surgery mid December.  She reports some numbness in the hands at nighttime which is stable.  She has no difficulty picking up things or dropping things.  She is able to tie her shoes.  Appetite is 75%.  Energy levels are 25%.  Aching pain in the left upper quadrant and abdomen which is controlled with pain medication.  REVIEW OF SYSTEMS:  Review of Systems  Neurological: Positive for numbness.  All other systems reviewed and are negative.    PAST MEDICAL/SURGICAL HISTORY:  Past Medical History:  Diagnosis Date  . Anxiety and depression   . Arthritis of facet joints at multiple vertebral levels    L5-S1  . Constipation   . Dyslipidemia   . Family history of breast cancer   . Family history of uterine cancer   . Insomnia   . Irritable bowel syndrome   . Migraine   . Muscle tension headache   . Ovarian carcinoma ([place]   . Plantar fasciitis of right foot   . Port-A-Cath in place 08/20/2018   Past  Surgical History:  Procedure Laterality Date  . CHOLECYSTECTOMY  2008  . COLONOSCOPY N/A 08/13/2013   Procedure: COLONOSCOPY;  Surgeon: Rogene Houston, MD;  [facility];  Service: Endoscopy;  Laterality: N/A;  230-moved to 145 Ann to notify pt  . ESOPHAGOGASTRODUODENOSCOPY    . ESOPHAGOGASTRODUODENOSCOPY (EGD) WITH PROPOFOL N/A 07/20/2018   Procedure: ESOPHAGOGASTRODUODENOSCOPY (EGD) WITH PROPOFOL;  Surgeon: Rogene Houston, MD;  [facility];   Service: Endoscopy;  Laterality: N/A;  Possible esophageal dilation.  . IR ANGIOGRAM SELECTIVE EACH ADDITIONAL VESSEL  08/01/2018  . IR ANGIOGRAM VISCERAL SELECTIVE  08/01/2018  . IR EMBO ART  VEN HEMORR LYMPH EXTRAV  INC GUIDE ROADMAPPING  08/01/2018  . IR IMAGING GUIDED PORT INSERTION  08/20/2018  . IR PERC PLEURAL DRAIN W/INDWELL CATH W/IMG GUIDE  07/08/2018  . IR THORACENTESIS ASP PLEURAL SPACE W/IMG GUIDE  07/07/2018  . IR US GUIDE VASC ACCESS RIGHT  08/01/2018  . PLEURAL EFFUSION DRAINAGE Left 07/13/2018   Procedure: DRAINAGE OF LOCULATED PLEURAL EFFUSION;  Surgeon: Ivin Poot, MD;  Location: [place];  Service: Thoracic;  Laterality: Left;  . REMOVAL OF PLEURAL DRAINAGE CATHETER Left 08/20/2018   Procedure: REMOVAL OF PLEURAL DRAINAGE CATHETER;  Surgeon: Ivin Poot, MD;  Location: [place];  Service: Thoracic;  Laterality: Left;  . REMOVAL OF PLEURAL DRAINAGE CATHETER Left 08/20/2018   Procedure: REMOVAL OF PLEURAL DRAINAGE CATHETER;  Surgeon: Ivin Poot, MD;  Location: [place];  Service: Thoracic;  Laterality: Left;  . TALC PLEURODESIS Left 07/13/2018   Procedure: Talc Pleuradesis;  Surgeon: Prescott Gum, Collier [place], MD;  Location: [facility];  Service: Thoracic;  Laterality: Left;  . TUBAL LIGATION Bilateral   . UTERINE ABLATION    . VIDEO ASSISTED THORACOSCOPY Left 07/13/2018   Procedure: VIDEO ASSISTED THORACOSCOPY;  Surgeon: Ivin Poot, MD;  Location[facility];  Service: Thoracic;  Laterality: Left;     SOCIAL HISTORY:  Social History   Socioeconomic History  . Marital status: Widowed    Spouse name: Not on file  . Number of children: 2  . Years of education: 2-College  . Highest education level: Not on file  Occupational History    Employer: [employer]  . Financial resource strain: Not hard at all  . Food insecurity    Worry: Never true    Inability: Never true  . Transportation needs    Medical: No    Non-medical: No  Tobacco Use  . Smoking status: Former Smoker     Packs/day: 0.50    Years: 17.00    Pack years: 8.50    Types: Cigarettes    Quit date: 06/22/2018    Years since quitting: 0.4  . Smokeless tobacco: Never Used  Substance and Sexual Activity  . Alcohol use: Yes    Alcohol/week: 1.0 standard drinks    Types: 1 Glasses of wine per week    Comment: Drinks alcohol on occasion  . Drug use: No  . Sexual activity: Not on file  Lifestyle  . Physical activity    Days per week: 0 days    Minutes per session: 0 min  . Stress: Very much  Relationships  . Social connections    Talks on phone: More than three times a week    Gets together: More than three times a week    Attends religious service: 1 to 4 times per year    Active member of club or organization: No  Attends meetings of clubs or organizations: Never    Relationship status: Widowed  . Intimate partner violence    Fear of current or ex partner: No    Emotionally abused: No    Physically abused: No    Forced sexual activity: No  Other Topics Concern  . Not on file  Social History Narrative   Patient lives at home with her daughter.    Patient has 2 children.    Patient is widowed.    Patient is right handed.    Patient has her Associates degree.       FAMILY HISTORY:  Family History  Problem Relation Age of Onset  . Hypertension Mother   . Obesity Mother   . Diabetes Mother   . Kidney disease Mother   . Peripheral vascular disease Father   . Atrial fibrillation Father   . COPD Brother   . Osteoporosis Brother   . Crohn's disease Sister   . Uterine cancer Sister 66       maternal half sister  . Breast cancer Maternal Aunt 84  . Colon cancer Neg Hx     CURRENT MEDICATIONS:  Outpatient Encounter Medications as of 12/01/2018  Medication Sig  . CARBOPLATIN IV Inject into the vein every 21 ( twenty-one) days.  Marland Kitchen dexamethasone (DECADRON) 4 MG tablet TAKE ONE TABLET BY MOUTH DAILY.  . diazepam (VALIUM) 5 MG tablet Take 1 tablet (5 mg total) by mouth at bedtime.   . dicyclomine (BENTYL) 10 MG capsule Take 1 capsule (10 mg total) by mouth 4 (four) times daily -  before meals and at bedtime.  Derrill Memo ON 12/30/2018] enoxaparin (LOVENOX) 40 MG/0.4ML injection Inject 0.4 mLs (40 mg total) into the skin daily for 14 doses. For AFTER SURGERY only (Patient not taking: Reported on 12/01/2018)  . lidocaine-prilocaine (EMLA) cream Apply small amount to port a cath site and cover with plastic wrap one hour prior to chemotherapy appointments  . ondansetron (ZOFRAN-ODT) 8 MG disintegrating tablet TAKE ONE TABLET BY MOUTH EVERY 8 HOURS AS NEEDED FOR NAUSEA AND VOMITING.  Marland Kitchen Oxycodone HCl 10 MG TABS Take 1 tablet (10 mg total) by mouth every 4 (four) hours as needed.  Marland Kitchen PACLITAXEL IV Inject into the vein every 21 ( twenty-one) days.  . pantoprazole (PROTONIX) 40 MG tablet Take 1 tablet (40 mg total) by mouth 2 (two) times daily before a meal.  . polyethylene glycol (MIRALAX / GLYCOLAX) 17 g packet Take 17 g by mouth as needed.  . promethazine (PHENERGAN) 25 MG tablet Take 1 tablet (25 mg total) by mouth every 6 (six) hours as needed for nausea, vomiting or refractory nausea / vomiting. (Patient not taking: Reported on 12/01/2018)  . senna (SENOKOT) 8.6 MG TABS tablet Take 1 tablet (8.6 mg total) by mouth daily as needed for mild constipation or moderate constipation.  . SUMAtriptan (IMITREX) 100 MG tablet TAKE ONE TABLET BY MOUTH PRN UP to TWICE DAILY AS NEEDED.  . [DISCONTINUED] dicyclomine (BENTYL) 10 MG capsule TAKE ONE CAPSULE BY MOUTH FOUR TIMES DAILY. TAKE BEFORE MEALS AND AT BEDTIME.   No facility-administered encounter medications on file as of 12/01/2018.     ALLERGIES:  Allergies  Allergen Reactions  . Morphine And Related Itching  . Nortriptyline Other (See Comments)    Significant weight gain  . Topamax [Topiramate] Diarrhea    nausea  . Actifed Cold-Allergy [Chlorpheniramine-Phenyleph Er] Rash  . Amoxicillin Rash    Did it involve swelling of the  face/tongue/throat, SOB, or low BP? Unknown Did it involve sudden or severe rash/hives, skin peeling, or any reaction on the inside of your mouth or nose? Unknown Did you need to seek medical attention at a hospital or doctor's office? Unknown When did it last happen? teenager If all above answers are "NO", may proceed with cephalosporin use.   . Codeine Hives  . Erythromycin Rash  . Penicillins Rash    Did it involve swelling of the face/tongue/throat, SOB, or low BP? Unknown Did it involve sudden or severe rash/hives, skin peeling, or any reaction on the inside of your mouth or nose? Unknown Did you need to seek medical attention at a hospital or doctor's office? Unknown When did it last happen? teenager If all above answers are "NO", may proceed with cephalosporin use.   [name] [Pseudoephedrine Hcl] Rash     PHYSICAL EXAM:  ECOG Performance status: 1  Vitals:   12/01/18 0815  BP: (!) 113/53  Pulse: 87  Resp: 16  Temp: (!) 97 F (36.1 C)  SpO2: 100%   Filed Weights   12/01/18 0815  Weight: 164 lb (74.4 kg)    Physical Exam Vitals signs reviewed.  Constitutional:      Appearance: Normal appearance.  Cardiovascular:     Rate and Rhythm: Normal rate and regular rhythm.     Heart sounds: Normal heart sounds.  Pulmonary:     Effort: Pulmonary effort is normal.     Breath sounds: Normal breath sounds.  Abdominal:     General: There is no distension.     Palpations: Abdomen is soft. There is no mass.  Skin:    General: Skin is warm.  Neurological:     General: No focal deficit present.     Mental Status: She is alert and oriented to person, place, and time.  Psychiatric:        Mood and Affect: Mood normal.        Behavior: Behavior normal.      LABORATORY DATA:  I have reviewed the labs as listed.  CBC    Component Value Date/Time   WBC 6.2 12/01/2018 0819   RBC 3.70 (L) 12/01/2018 0819   HGB 13.4 12/01/2018 0819   HCT 40.9 12/01/2018  0819   PLT 385 12/01/2018 0819   MCV 110.5 (H) 12/01/2018 0819   MCH 36.2 (H) 12/01/2018 0819   MCHC 32.8 12/01/2018 0819   RDW 15.9 (H) 12/01/2018 0819   LYMPHSABS 1.8 12/01/2018 0819   MONOABS 0.6 12/01/2018 0819   EOSABS 0.3 12/01/2018 0819   BASOSABS 0.0 12/01/2018 0819   CMP Latest Ref Rng & Units 12/01/2018 11/06/2018 10/05/2018  Glucose 70 - 99 mg/dL 104(H) 101(H) 112(H)  BUN 6 - 20 mg/dL _0 Creatinine 0.44 - 1.00 mg/dL 0.58 0.58 0.53  Sodium 135 - 145 mmol/L 138 138 139  Potassium 3.5 - 5.1 mmol/L 3.5 4.0 3.7  Chloride 98 - 111 mmol/L 104 105 105  CO2 22 - 32 mmol/L _1 Calcium 8.9 - 10.3 mg/dL 9.5 9.4 9.2  Total Protein 6.5 - 8.1 g/dL 6.7 6.6 7.2  Total Bilirubin 0.3 - 1.2 mg/dL 0.7 1.0 0.8  Alkaline Phos 38 - 126 U/L 91 84 122  AST 15 - 41 U/L _2 ALT 0 - 44 U/L _3 DIAGNOSTIC IMAGING:  I have independently reviewed the scans and discussed with the patient.   I  have reviewed Venita Lick LPN's note and agree with the documentation.  I personally performed a face-to-face visit, made revisions and my assessment and plan is as follows.    ASSESSMENT & PLAN:   Ovarian cancer, bilateral ([place]) 1.  Clinical stage IVa ovarian cancer, positive cytology of left pleural effusion: -3 cycles of carboplatin and paclitaxel from 08/24/2018 through 10/05/2018. -Germline mutation testing is negative. -Ca1 25 improved to 144 from 1778. -CT CAP on 10/13/2018 showed decrease in size of bilateral ovarian masses.  Complete resolution of left pleural effusion.  Significant improvement in the peritoneal and omental disease. -Further treatments were delayed secondary to her son involved in a fatal accident. -She met with Dr. Denman George on 11/28/2018.  She was told to proceed with 1 more cycle of chemotherapy followed by CT scan and surgical debulking in mid December.  She will have 3 more cycles after surgery. -Today she is feeling well.  We reviewed her labs.   We will proceed with cycle 4 of chemotherapy. -We will see her back in 3 weeks for follow-up.  We will schedule for CT scans 1 day prior to next visit.  2.  Left lateral chest wall and abdominal pain: -She is taking oxycodone 10 mg, 4 to 5 tablets daily.  3.  Splenic hematoma: -Ligation of splenic artery on 08/01/2018.  Subsequent scan showed stable hematoma.  Hemoglobin has been stable.  4.  Peripheral neuropathy: -She has mild numbness in the hands at nighttime, chemotherapy-induced.  Requires no medical intervention.   Total time spent is 25 minutes with more than 50% of the time spent face-to-face discussing scan results, counseling and coordination of care.  Orders placed this encounter:  Orders Placed This Encounter  Procedures  . CBC with Differential/Platelet  . Comprehensive metabolic panel  . CA [place], MD [place] [phone]  \

## 2018-12-01 NOTE — Progress Notes (Signed)
Labs reviewed with MD today. Proceed as planned.   Treatment given per orders. Patient tolerated it well without problems. Vitals stable and discharged home from clinic ambulatory. Follow up as scheduled.

## 2018-12-14 ENCOUNTER — Other Ambulatory Visit (HOSPITAL_COMMUNITY): Payer: Self-pay | Admitting: *Deleted

## 2018-12-14 DIAGNOSIS — C561 Malignant neoplasm of right ovary: Secondary | ICD-10-CM

## 2018-12-14 DIAGNOSIS — C563 Malignant neoplasm of bilateral ovaries: Secondary | ICD-10-CM

## 2018-12-14 MED ORDER — OXYCODONE HCL 10 MG PO TABS: 10.0000 mg | ORAL_TABLET | ORAL | 0 refills | Status: DC | PRN

## 2018-12-21 ENCOUNTER — Ambulatory Visit (HOSPITAL_COMMUNITY)
Admission: RE | Admit: 2018-12-21 | Discharge: 2018-12-21 | Disposition: A | Discharge status: Home / Self Care | Payer: Medicaid Other | Source: Ambulatory Visit | Attending: Gynecologic Oncology | Admitting: Gynecologic Oncology

## 2018-12-21 ENCOUNTER — Other Ambulatory Visit: Payer: Self-pay

## 2018-12-21 DIAGNOSIS — C561 Malignant neoplasm of right ovary: Secondary | ICD-10-CM | POA: Insufficient documentation

## 2018-12-21 DIAGNOSIS — C563 Malignant neoplasm of bilateral ovaries: Secondary | ICD-10-CM

## 2018-12-21 DIAGNOSIS — C562 Malignant neoplasm of left ovary: Secondary | ICD-10-CM | POA: Diagnosis present

## 2018-12-21 MED ORDER — IOHEXOL 9 MG/ML PO SOLN
ORAL | Status: AC
  Filled 2018-12-21: qty 1000

## 2018-12-21 MED ORDER — IOHEXOL 300 MG/ML  SOLN
100.0000 mL | Freq: Once | INTRAMUSCULAR | Status: AC | PRN
  Administered 2018-12-21: 100 mL via INTRAVENOUS

## 2018-12-22 ENCOUNTER — Other Ambulatory Visit (HOSPITAL_COMMUNITY): Payer: Medicaid Other

## 2018-12-22 ENCOUNTER — Ambulatory Visit (HOSPITAL_COMMUNITY): Payer: Medicaid Other

## 2018-12-22 ENCOUNTER — Ambulatory Visit (HOSPITAL_COMMUNITY): Payer: Medicaid Other | Admitting: Hematology

## 2018-12-23 ENCOUNTER — Telehealth: Payer: Self-pay | Admitting: Oncology

## 2018-12-23 ENCOUNTER — Inpatient Hospital Stay (HOSPITAL_COMMUNITY): Payer: Medicaid Other

## 2018-12-23 ENCOUNTER — Other Ambulatory Visit: Payer: Self-pay

## 2018-12-23 ENCOUNTER — Encounter (HOSPITAL_COMMUNITY): Payer: Self-pay | Admitting: Hematology

## 2018-12-23 ENCOUNTER — Inpatient Hospital Stay (HOSPITAL_COMMUNITY): Payer: Medicaid Other | Attending: Hematology | Admitting: Hematology

## 2018-12-23 ENCOUNTER — Telehealth: Payer: Self-pay | Admitting: *Deleted

## 2018-12-23 VITALS — BP 125/68 | HR 88 | Temp 97.5°F | Resp 18 | Wt 167.0 lb

## 2018-12-23 DIAGNOSIS — G620 Drug-induced polyneuropathy: Secondary | ICD-10-CM | POA: Diagnosis not present

## 2018-12-23 DIAGNOSIS — Z9221 Personal history of antineoplastic chemotherapy: Secondary | ICD-10-CM | POA: Insufficient documentation

## 2018-12-23 DIAGNOSIS — R079 Chest pain, unspecified: Secondary | ICD-10-CM | POA: Insufficient documentation

## 2018-12-23 DIAGNOSIS — T451X5A Adverse effect of antineoplastic and immunosuppressive drugs, initial encounter: Secondary | ICD-10-CM

## 2018-12-23 DIAGNOSIS — C563 Malignant neoplasm of bilateral ovaries: Secondary | ICD-10-CM

## 2018-12-23 DIAGNOSIS — C561 Malignant neoplasm of right ovary: Secondary | ICD-10-CM | POA: Diagnosis present

## 2018-12-23 DIAGNOSIS — C562 Malignant neoplasm of left ovary: Secondary | ICD-10-CM | POA: Insufficient documentation

## 2018-12-23 DIAGNOSIS — R109 Unspecified abdominal pain: Secondary | ICD-10-CM | POA: Insufficient documentation

## 2018-12-23 DIAGNOSIS — G629 Polyneuropathy, unspecified: Secondary | ICD-10-CM | POA: Diagnosis not present

## 2018-12-23 DIAGNOSIS — Z87891 Personal history of nicotine dependence: Secondary | ICD-10-CM | POA: Diagnosis not present

## 2018-12-23 DIAGNOSIS — Z79899 Other long term (current) drug therapy: Secondary | ICD-10-CM | POA: Insufficient documentation

## 2018-12-23 LAB — COMPREHENSIVE METABOLIC PANEL
ALT: 78 U/L — ABNORMAL HIGH (ref 0–44)
AST: 52 U/L — ABNORMAL HIGH (ref 15–41)
Albumin: 3.9 g/dL (ref 3.5–5.0)
Alkaline Phosphatase: 152 U/L — ABNORMAL HIGH (ref 38–126)
Anion gap: 11 (ref 5–15)
BUN: 14 mg/dL (ref 6–20)
CO2: 27 mmol/L (ref 22–32)
Calcium: 9.9 mg/dL (ref 8.9–10.3)
Chloride: 101 mmol/L (ref 98–111)
Creatinine, Ser: 0.6 mg/dL (ref 0.44–1.00)
GFR calc Af Amer: >60 mL/min (ref >60)
GFR calc non Af Amer: >60 mL/min (ref >60)
Glucose, Bld: 115 mg/dL — ABNORMAL HIGH (ref 70–99)
Potassium: 4.4 mmol/L (ref 3.5–5.1)
Sodium: 139 mmol/L (ref 135–145)
Total Bilirubin: 0.7 mg/dL (ref 0.3–1.2)
Total Protein: 7 g/dL (ref 6.5–8.1)

## 2018-12-23 LAB — CBC WITH DIFFERENTIAL/PLATELET
Abs Immature Granulocytes: 0.01 10*3/uL (ref 0.00–0.07)
Basophils Absolute: 0 10*3/uL (ref 0.0–0.1)
Basophils Relative: 1 %
Eosinophils Absolute: 0.1 10*3/uL (ref 0.0–0.5)
Eosinophils Relative: 1 %
HCT: 40.7 % (ref 36.0–46.0)
Hemoglobin: 13.5 g/dL (ref 12.0–15.0)
Immature Granulocytes: 0 %
Lymphocytes Relative: 34 %
Lymphs Abs: 1.8 10*3/uL (ref 0.7–4.0)
MCH: 37.9 pg — ABNORMAL HIGH (ref 26.0–34.0)
MCHC: 33.2 g/dL (ref 30.0–36.0)
MCV: 114.3 fL — ABNORMAL HIGH (ref 80.0–100.0)
Monocytes Absolute: 0.5 10*3/uL (ref 0.1–1.0)
Monocytes Relative: 10 %
Neutro Abs: 2.9 10*3/uL (ref 1.7–7.7)
Neutrophils Relative %: 54 %
Platelets: 239 10*3/uL (ref 150–400)
RBC: 3.56 MIL/uL — ABNORMAL LOW (ref 3.87–5.11)
RDW: 13.2 % (ref 11.5–15.5)
WBC: 5.3 10*3/uL (ref 4.0–10.5)
nRBC: 0 % (ref 0.0–0.2)

## 2018-12-23 MED ORDER — GABAPENTIN 300 MG PO CAPS: 300.0000 mg | ORAL_CAPSULE | Freq: Three times a day (TID) | ORAL | 0 refills | Status: DC

## 2018-12-23 NOTE — Telephone Encounter (Signed)
Michelle Holt asked if she can have Covid testing after her pre op appointment tomorrow.  She is not able to have testing on Friday.  Advised her that I will check with pre op testing and call her back.  She also asked about post op pain medication.  She is currently taking oxycodone 10 mg q 4 hours prn.  She said she has been using it for about 6 months and is concerned it won't help for post op pain.  She has also started taking gabapentin for nerve pain.  Advised her that I will notify Joylene John, NP.

## 2018-12-23 NOTE — Patient Instructions (Addendum)
[place] at [facility] Discharge Instructions  You were seen today by Dr. Delton Coombes. He went over your recent lab results. He will send in a new prescription for gabapentin 300mg . He will see you back in 5 weeks for labs and follow up.   Thank you for choosing [place] at [facility] to provide your oncology and hematology care.  To afford each patient quality time with our provider, please arrive at least 15 minutes before your scheduled appointment time.   If you have a lab appointment with the [place] please come in thru the  Main Entrance and check in at the main information desk  You need to re-schedule your appointment should you arrive 10 or more minutes late.  We strive to give you quality time with our providers, and arriving late affects you and other patients whose appointments are after yours.  Also, if you no show three or more times for appointments you may be dismissed from the clinic at the providers discretion.     Again, thank you for choosing [facility].  Our hope is that these requests will decrease the amount of time that you wait before being seen by our physicians.       _____________________________________________________________  Should you have questions after your visit to [facility], please contact our office at [phone] between the hours of 8:00 a.m. and 4:30 p.m.  Voicemails left after 4:00 p.m. will not be returned until the following business day.  For prescription refill requests, have your pharmacy contact our office and allow 72 hours.    Cancer Center Support Programs:   > Cancer Support Group  2nd Tuesday of the month 1pm-2pm, Journey Room

## 2018-12-23 NOTE — Progress Notes (Signed)
[place], [place] [address]   CLINIC:  Medical Oncology/Hematology  PCP:  [name], MD [place] [address] [phone]   REASON FOR VISIT:  Follow-up for ovarian cancer, splenic hematoma.   BRIEF ONCOLOGIC HISTORY:  Oncology History  Ovarian cancer, bilateral ([place])  07/07/2018 Pathology Results   PLEURAL FLUID, LEFT (SPECIMEN 1 OF 1, COLLECTED 07/07/18): - MALIGNANT CELLS CONSISTENT WITH ADENOCARCINOMA - SEE COMMENT  Source Pleural Fluid, (Specimen 1 of 1, collected on 07/07/2018) Gross Specimen: Received is/are 1000cc of bloody red fluid with tissue. (TC:tc) Prepared: # Smears: 0 # Concentration Technique Slides (i.e. ThinPrep): 1 # Cell Block: 1 Conventional Additional Studies: Two Hematology slides labeled [id number] Comment The malignant cells are positive for cytokeratin 7, p53, WT-1, Pax-8, Moc31, ER (weak) and EMA but negative for cytokeratin 20, TTF-1, GATA-3, CDX-2 and D2-40. Overall, the phenotype is consistent with a gynecologic primary; clinical correlation recommended.   07/07/2018 Procedure   Successful ultrasound guided left thoracentesis yielding 2.0 L of pleural fluid   07/08/2018 Procedure   1. Technically successful placement of left 14 French pigtail chest drain, placed to Pleur-evac water-seal.   07/08/2018 Procedure   1. Technically successful five [place] double lumen power injectable PICC placement   07/09/2018 Imaging   Ct chest 1. There is a moderate, loculated left hydropneumothorax with a small air component and moderate fluid component. The largest loculated component is located posteriorly. There is a pigtail drainage catheter about the lateral pleural space. There is no obvious etiology, such as obvious mass or pleural disease.   2. There is a small right pleural effusion with associated atelectasis or consolidation and a subpleural consolidation of the superior segment right lower lobe (series  4, image 56), of uncertain significance, possibly infectious or inflammatory   07/10/2018 Imaging   Ct abdomen and pelvis: 1. The bilateral ovaries are enlarged by heterogeneous appearing cystic lesions, measuring 5.3 x 4.2 cm on the right (series 4, image 72) and 4.5 x 3.2 cm on the left (series 4, image 75). Consider dedicated pelvic ultrasound and/or pelvic MRI to further evaluate for solid components given high suspicion for GYN primary malignancy.   2. No other evidence of mass and no lymphadenopathy in the abdomen or pelvis.   3. Trace ascites. There is some suggestion of omental and peritoneal nodularity (e.g. Series 4, image 81), concerning for peritoneal metastatic disease.    4. Loculated left-sided pleural effusion with left-sided pleural drainage catheter in position. Small right pleural effusion   07/13/2018 Surgery   OPERATION: 1.  Left VATS (video-assisted thoracoscopic surgery) for drainage of loculated pleural effusion. 2.  Talc pleurodesis for malignant pleural effusion. 3.  Placement of PleurX catheter for management of malignant pleural effusion. 4.  Placement of On-Q analgesia catheter system.    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   07/13/2018 Pathology Results   Pleura, peel, Left Pleural - FIBRO-FIBRINOUS PLEURITIS - NEGATIVE FOR MALIGNANCY   07/18/2018 Initial Diagnosis   Ovarian cancer, bilateral ([place])   07/20/2018 Procedure   EGD impression: Normal proximal esophagus and mid esophagus. Mild distal esophageal rings; dilation not performed because of esophagitis. LA Grade C reflux esophagitis. Z-line regular, 30 cm from the incisors. 5 cm hiatal hernia. Non-bleeding gastric ulcer with no stigmata of bleeding. Gastritis.  Duodenal erosions without bleeding. Normal  second portion of the duodenum. No specimens collected.   08/19/2018 Genetic Testing   Negative genetic testing on the common hereditary cancer panel.  The Common Hereditary Gene Panel offered by [facility] includes sequencing and/or deletion duplication testing of the following 48 genes: APC, ATM, AXIN2, BARD1, BMPR1A, BRCA1, BRCA2, BRIP1, CDH1, CDK4, CDKN2A (p14ARF), CDKN2A (p16INK4a), CHEK2, CTNNA1, DICER1, EPCAM (Deletion/duplication testing only), GREM1 (promoter region deletion/duplication testing only), KIT, MEN1, MLH1, MSH2, MSH3, MSH6, MUTYH, NBN, NF1, NHTL1, PALB2, PDGFRA, PMS2, POLD1, POLE, PTEN, RAD50, RAD51C, RAD51D, RNF43, SDHB, SDHC, SDHD, SMAD4, SMARCA4. STK11, TP53, TSC1, TSC2, and VHL.  The following genes were evaluated for sequence changes only: SDHA and HOXB13 c.251G>A variant only. The report date is August 19, 2018.   08/24/2018 -  Chemotherapy   The patient had palonosetron (ALOXI) injection 0.25 mg, 0.25 mg, Intravenous,  Once, 4 of 6 cycles Administration: 0.25 mg (08/24/2018), 0.25 mg (09/14/2018), 0.25 mg (10/05/2018), 0.25 mg (12/01/2018) CARBOplatin (PARAPLATIN) 740 mg in sodium chloride 0.9 % 250 mL chemo infusion, 740 mg (100 % of original dose 742.8 mg), Intravenous,  Once, 4 of 6 cycles Dose modification:   (original dose 742.8 mg, Cycle 1),   (original dose 736.8 mg, Cycle 2),   (original dose 736.8 mg, Cycle 3),   (original dose 736.8 mg, Cycle 4) Administration: 740 mg (08/24/2018), 740 mg (09/14/2018), 740 mg (10/05/2018), 740 mg (12/01/2018) PACLitaxel (TAXOL) 324 mg in sodium chloride 0.9 % 500 mL chemo infusion (> 13m/m2), 175 mg/m2 = 324 mg, Intravenous,  Once, 4 of 6 cycles Administration: 324 mg (08/24/2018), 324 mg (09/14/2018), 324 mg (10/05/2018), 324 mg (12/01/2018) fosaprepitant (EMEND) 150 mg, dexamethasone (DECADRON) 12 mg in sodium chloride 0.9 % 145 mL IVPB, , Intravenous,  Once, 4 of 6 cycles Administration:  (08/24/2018),  (09/14/2018),  (10/05/2018),   (12/01/2018)  for chemotherapy treatment.       CANCER STAGING: Cancer Staging No matching staging information was found for the patient.   INTERVAL HISTORY:  Ms. [id number]. female seen for follow-up of ovarian cancer.  She has been treatment free since November 2020.  She had CT of the abdomen and pelvis done.  She reported hurting in the bottom of the feet in the last few weeks.  Her symptoms also hurt.  Appetite is 100%.  Energy levels are 50%.  Also reported pain all over the body.  REVIEW OF SYSTEMS:  Review of Systems  Neurological: Positive for numbness.  All other systems reviewed and are negative.    PAST MEDICAL/SURGICAL HISTORY:  Past Medical History:  Diagnosis Date   Anxiety and depression    Arthritis of facet joints at multiple vertebral levels    L5-S1   Constipation    Dyslipidemia    Family history of breast cancer    Family history of uterine cancer    History of kidney stones    Insomnia    Irritable bowel syndrome    Migraine    Muscle tension headache    Neuropathy of finger    Ovarian carcinoma (HCC)    Plantar fasciitis of right foot    Port-A-Cath in place 08/20/2018   Past Surgical History:  Procedure Laterality Date   CHOLECYSTECTOMY  2008   COLONOSCOPY N/A 08/13/2013   Procedure: COLONOSCOPY;  Surgeon: NRogene Houston MD;  [facility];  Service: Endoscopy;  Laterality: N/A;  230-moved to 145 Ann to notify pt   ESOPHAGOGASTRODUODENOSCOPY  ESOPHAGOGASTRODUODENOSCOPY (EGD) WITH PROPOFOL N/A 07/20/2018   Procedure: ESOPHAGOGASTRODUODENOSCOPY (EGD) WITH PROPOFOL;  Surgeon: Rogene Houston, MD;  [facility];  Service: Endoscopy;  Laterality: N/A;  Possible esophageal dilation.   IR ANGIOGRAM SELECTIVE EACH ADDITIONAL VESSEL  08/01/2018   IR ANGIOGRAM VISCERAL SELECTIVE  08/01/2018   IR EMBO ART  VEN HEMORR LYMPH EXTRAV  INC GUIDE ROADMAPPING  08/01/2018   IR IMAGING GUIDED PORT INSERTION   08/20/2018   IR PERC PLEURAL DRAIN W/INDWELL CATH W/IMG GUIDE  07/08/2018   IR THORACENTESIS ASP PLEURAL SPACE W/IMG GUIDE  07/07/2018   IR US GUIDE VASC ACCESS RIGHT  08/01/2018   PLEURAL EFFUSION DRAINAGE Left 07/13/2018   Procedure: DRAINAGE OF LOCULATED PLEURAL EFFUSION;  Surgeon: Ivin Poot, MD;  Location: [facility];  Service: Thoracic;  Laterality: Left;   REMOVAL OF PLEURAL DRAINAGE CATHETER Left 08/20/2018   Procedure: REMOVAL OF PLEURAL DRAINAGE CATHETER;  Surgeon: Ivin Poot, MD;  Location: [facility];  Service: Thoracic;  Laterality: Left;   REMOVAL OF PLEURAL DRAINAGE CATHETER Left 08/20/2018   Procedure: REMOVAL OF PLEURAL DRAINAGE CATHETER;  Surgeon: Ivin Poot, MD;  Location: [place] [facility];  Service: Thoracic;  Laterality: Left;   TALC PLEURODESIS Left 07/13/2018   Procedure: Talc Pleuradesis;  Surgeon: Prescott Gum, Collier Salina, MD;  Location: [facility];  Service: Thoracic;  Laterality: Left;   TUBAL LIGATION Bilateral    UTERINE ABLATION     VIDEO ASSISTED THORACOSCOPY Left 07/13/2018   Procedure: VIDEO ASSISTED THORACOSCOPY;  Surgeon: Ivin Poot, MD;  Location: [facility];  Service: Thoracic;  Laterality: Left;     SOCIAL HISTORY:  Social History   Socioeconomic History   Marital status: Widowed    Spouse name: Not on file   Number of children: 2   Years of education: 2-College   Highest education level: Not on file  Occupational History    Employer: [employer]  Tobacco Use   Smoking status: Former Smoker    Packs/day: 0.50    Years: 17.00    Pack years: 8.50    Types: Cigarettes    Quit date: 06/22/2018    Years since quitting: 0.5   Smokeless tobacco: Never Used  Substance and Sexual Activity   Alcohol use: Yes    Alcohol/week: 1.0 standard drinks    Types: 1 Glasses of wine per week    Comment: Drinks alcohol on occasion   Drug use: No   Sexual activity: Not on file  Other Topics Concern   Not on file  Social History Narrative   Patient lives at home with  her daughter.    Patient has 2 children.    Patient is widowed.    Patient is right handed.    Patient has her Associates degree.      Social Determinants of Health   Financial Resource Strain: Low Risk    Difficulty of Paying Living Expenses: Not hard at all  Food Insecurity: No Food Insecurity   Worried About Charity fundraiser in the Last Year: Never true   [place] in the Last Year: Never true  Transportation Needs: No Transportation Needs   Lack of Transportation (Medical): No   Lack of Transportation (Non-Medical): No  Physical Activity: Inactive   Days of Exercise per Week: 0 days   Minutes of Exercise per Session: 0 min  Stress: Stress Concern Present   Feeling of Stress : Very much  Social Connections: Somewhat Isolated   Frequency  of Communication with Friends and Family: More than three times a week   Frequency of Social Gatherings with Friends and Family: More than three times a week   Attends Religious Services: 1 to 4 times per year   Active Member of [employer] or Organizations: No   Attends Archivist Meetings: Never   Marital Status: Widowed  Human resources officer Violence: Not At Risk   Fear of Current or Ex-Partner: No   Emotionally Abused: No   Physically Abused: No   Sexually Abused: No    FAMILY HISTORY:  Family History  Problem Relation Age of Onset   Hypertension Mother    Obesity Mother    Diabetes Mother    Kidney disease Mother    Peripheral vascular disease Father    Atrial fibrillation Father    COPD Brother    Osteoporosis Brother    Crohn's disease Sister    Uterine cancer Sister 32       maternal half sister   Breast cancer Maternal Aunt 50   Colon cancer Neg Hx     CURRENT MEDICATIONS:  Outpatient Encounter Medications as of 12/23/2018  Medication Sig Note   Ascorbic Acid (VITAMIN C) 1000 MG tablet Take 1,000 mg by mouth every other day.    bisacodyl (DULCOLAX) 5 MG EC tablet Take 5 mg by  mouth 2 (two) times daily.    CARBOPLATIN IV Inject into the vein every 21 ( twenty-one) days.    Cholecalciferol (VITAMIN D) 50 MCG (2000 UT) CAPS Take 2,000 Units by mouth daily.    diazepam (VALIUM) 5 MG tablet Take 1 tablet (5 mg total) by mouth at bedtime.    magnesium oxide (MAG-OX) 400 MG tablet Take 400 mg by mouth every other day.    PACLITAXEL IV Inject into the vein every 21 ( twenty-one) days.    senna (SENOKOT) 8.6 MG TABS tablet Take 1 tablet (8.6 mg total) by mouth daily as needed for mild constipation or moderate constipation. (Patient taking differently: Take 1 tablet by mouth 2 (two) times daily. )    SUMAtriptan (IMITREX) 100 MG tablet TAKE ONE TABLET BY MOUTH PRN UP to TWICE DAILY AS NEEDED. (Patient taking differently: Take 100 mg by mouth every 2 (two) hours as needed for migraine. TAKE ONE TABLET BY MOUTH PRN UP to TWICE DAILY AS NEEDED.)    [DISCONTINUED] Oxycodone HCl 10 MG TABS Take 1 tablet (10 mg total) by mouth every 4 (four) hours as needed.    [DISCONTINUED] pantoprazole (PROTONIX) 40 MG tablet Take 1 tablet (40 mg total) by mouth 2 (two) times daily before a meal.    acetaminophen (TYLENOL) 500 MG tablet Take 1,000 mg by mouth every 6 (six) hours as needed for moderate pain.    dexamethasone (DECADRON) 4 MG tablet TAKE ONE TABLET BY MOUTH DAILY. (Patient not taking: No sig reported)    dicyclomine (BENTYL) 10 MG capsule Take 1 capsule (10 mg total) by mouth 4 (four) times daily -  before meals and at bedtime. (Patient not taking: Reported on 12/23/2018) 12/22/2018: Typically during chemo   enoxaparin (LOVENOX) 40 MG/0.4ML injection Inject 0.4 mLs (40 mg total) into the skin daily for 14 doses. For AFTER SURGERY only (Patient not taking: Reported on 12/23/2018) 12/22/2018: For use after procedure   gabapentin (NEURONTIN) 300 MG capsule Take 1 capsule (300 mg total) by mouth 3 (three) times daily. (Patient not taking: Reported on 12/24/2018) 12/29/2018: Stopped  in August   lidocaine-prilocaine (EMLA) cream Apply  small amount to port a cath site and cover with plastic wrap one hour prior to chemotherapy appointments (Patient not taking: Reported on 12/23/2018)    ondansetron (ZOFRAN-ODT) 8 MG disintegrating tablet TAKE ONE TABLET BY MOUTH EVERY 8 HOURS AS NEEDED FOR NAUSEA AND VOMITING. (Patient not taking: No sig reported)    polyethylene glycol (MIRALAX / GLYCOLAX) 17 g packet Take 17 g by mouth daily as needed for moderate constipation.     [DISCONTINUED] promethazine (PHENERGAN) 25 MG tablet Take 1 tablet (25 mg total) by mouth every 6 (six) hours as needed for nausea, vomiting or refractory nausea / vomiting. (Patient not taking: Reported on 12/01/2018)    No facility-administered encounter medications on file as of 12/23/2018.    ALLERGIES:  Allergies  Allergen Reactions   Morphine And Related Itching   Nortriptyline Other (See Comments)    Significant weight gain   Topamax [Topiramate] Diarrhea and Nausea Only   Actifed Cold-Allergy [Chlorpheniramine-Phenyleph Er] Rash   Amoxicillin Rash    Did it involve swelling of the face/tongue/throat, SOB, or low BP? Unknown Did it involve sudden or severe rash/hives, skin peeling, or any reaction on the inside of your mouth or nose? Unknown Did you need to seek medical attention at a hospital or doctor's office? Unknown When did it last happen? teenager If all above answers are "NO", may proceed with cephalosporin use.    Codeine Hives   Erythromycin Rash   Penicillins Rash    Did it involve swelling of the face/tongue/throat, SOB, or low BP? Unknown Did it involve sudden or severe rash/hives, skin peeling, or any reaction on the inside of your mouth or nose? Unknown Did you need to seek medical attention at a hospital or doctor's office? Unknown When did it last happen? teenager If all above answers are "NO", may proceed with cephalosporin use.    Sudafed  [Pseudoephedrine Hcl] Rash     PHYSICAL EXAM:  ECOG Performance status: 1  Vitals:   12/23/18 1048  BP: 125/68  Pulse: 88  Resp: 18  Temp: (!) 97.5 F (36.4 C)  SpO2: 99%   Filed Weights   12/23/18 1048  Weight: 167 lb (75.8 kg)    Physical Exam Vitals signs reviewed.  Constitutional:      Appearance: Normal appearance.  Cardiovascular:     Rate and Rhythm: Normal rate and regular rhythm.     Heart sounds: Normal heart sounds.  Pulmonary:     Effort: Pulmonary effort is normal.     Breath sounds: Normal breath sounds.  Abdominal:     General: There is no distension.     Palpations: Abdomen is soft. There is no mass.  Skin:    General: Skin is warm.  Neurological:     General: No focal deficit present.     Mental Status: She is alert and oriented to person, place, and time.  Psychiatric:        Mood and Affect: Mood normal.        Behavior: Behavior normal.      LABORATORY DATA:  I have reviewed the labs as listed.  CBC    Component Value Date/Time   WBC 5.3 12/23/2018 1004   RBC 3.56 (L) 12/23/2018 1004   HGB 13.5 12/23/2018 1004   HCT 40.7 12/23/2018 1004   PLT 239 12/23/2018 1004   MCV 114.3 (H) 12/23/2018 1004   MCH 37.9 (H) 12/23/2018 1004   MCHC 33.2 12/23/2018 1004   RDW 13.2 12/23/2018 1004  LYMPHSABS 1.8 12/23/2018 1004   MONOABS 0.5 12/23/2018 1004   EOSABS 0.1 12/23/2018 1004   BASOSABS 0.0 12/23/2018 1004   CMP Latest Ref Rng & Units 12/23/2018 12/01/2018 11/06/2018  Glucose 70 - 99 mg/dL 115(H) 104(H) 101(H)  BUN 6 - 20 mg/dL _0 Creatinine 0.44 - 1.00 mg/dL 0.60 0.58 0.58  Sodium 135 - 145 mmol/L 139 138 138  Potassium 3.5 - 5.1 mmol/L 4.4 3.5 4.0  Chloride 98 - 111 mmol/L 101 104 105  CO2 22 - 32 mmol/L _1 Calcium 8.9 - 10.3 mg/dL 9.9 9.5 9.4  Total Protein 6.5 - 8.1 g/dL 7.0 6.7 6.6  Total Bilirubin 0.3 - 1.2 mg/dL 0.7 0.7 1.0  Alkaline Phos 38 - 126 U/L 152(H) 91 84  AST 15 - 41 U/L 52(H) 30 21  ALT 0 - 44 U/L  78(H) 26 17       DIAGNOSTIC IMAGING:  I have independently reviewed the scans and discussed with the patient.   I have reviewed Venita Lick LPN's note and agree with the documentation.  I personally performed a face-to-face visit, made revisions and my assessment and plan is as follows.    ASSESSMENT & PLAN:   Ovarian cancer, bilateral ([place]) 1.  Clinical stage IVa ovarian cancer, positive cytology of left pleural effusion: -4 cycles of carboplatin and paclitaxel from 08/24/2018 through 12/01/2018. -CT CAP on 10/13/2018 showed decrease in size of bilateral ovarian masses. -We reviewed results of CT scan of the abdomen and pelvis from 12/21/2018 which showed continuous decrease with no new areas. -Germline mutation testing is negative. -We have reviewed labs today.  She will proceed with debulking surgery with Dr. Denman George on 12/29/2018. -We will see her back after surgery to discuss pathology and further plan.  2.  Left lateral chest wall and abdominal pain: -She is taking oxycodone 10 mg, 4 to 5 tablets daily.  3.  Splenic hematoma: -Ligation of splenic artery on 08/01/2018.  Subsequent scan showed stable hematoma.  Hemoglobin has been stable.  4.  Peripheral neuropathy: -She reported numbness in the feet which are hurting in the last few weeks.  Her shins also ache very badly. -We will start her on gabapentin 300 mg 3 times a day.   Total time spent is 25 minutes with more than 50% of the time spent face-to-face discussing scan results, counseling and coordination of care.  Orders placed this encounter:  No orders of the defined types were placed in this encounter.     Derek Jack, MD [place] [phone]  \

## 2018-12-23 NOTE — Telephone Encounter (Signed)
Pt notified of her CT results and that Dr. Denman George would like to procede with surgery as planned. Pt is agreeable with plan of care.

## 2018-12-23 NOTE — Patient Instructions (Signed)
DUE TO COVID-19 ONLY ONE VISITOR IS ALLOWED TO COME WITH YOU AND STAY IN THE WAITING ROOM ONLY DURING PRE OP AND PROCEDURE DAY OF SURGERY. THE 1 VISITOR MAY VISIT WITH YOU AFTER SURGERY IN YOUR PRIVATE ROOM DURING VISITING HOURS ONLY!  YOU NEED TO HAVE A COVID 19 TEST ON_12/11_____ @_______ , THIS TEST MUST BE DONE BEFORE SURGERY, COME  [name] , [id number].  ([place]) ONCE YOUR COVID TEST IS COMPLETED, PLEASE BEGIN THE QUARANTINE INSTRUCTIONS AS OUTLINED IN YOUR HANDOUT.                Michelle Holt    Your procedure is scheduled on: 12/29/18   Report to [facility] Main  Entrance   Report to Short Stay at 5:30AM     Call this number if you have problems the morning of surgery [phone]    Remember: Do not eat food or drink liquids :After Midnight.   BRUSH YOUR TEETH MORNING OF SURGERY AND RINSE YOUR MOUTH OUT, NO CHEWING GUM CANDY OR MINTS.     Take these medicines the morning of surgery with A SIP OF WATER:  Protonix                                 You may not have any metal on your body including hair pins and              piercings  Do not wear jewelry, make-up, lotions, powders or perfumes, deodorant             Do not wear nail polish on your fingernails.  Do not shave  48 hours prior to surgery.     Do not bring valuables to the hospital. Michelle Holt.  Contacts, dentures or bridgework may not be worn into surgery.      Patients discharged the day of surgery will not be allowed to drive home.   IF YOU ARE HAVING SURGERY AND GOING HOME THE SAME DAY, YOU MUST HAVE AN ADULT TO DRIVE YOU HOME AND BE WITH YOU FOR 24 HOURS.   YOU MAY GO HOME BY TAXI OR UBER OR ORTHERWISE, BUT AN ADULT MUST ACCOMPANY YOU HOME AND STAY WITH YOU FOR 24 HOURS.  Name and phone number of your driver:  Special Instructions: N/A              Please read over the following fact sheets you were  given: _____________________________________________________________________             [facility] - Preparing for Surgery  Before surgery, you can play an important role.   Because skin is not sterile, your skin needs to be as free of germs as possible.   You can reduce the number of germs on your skin by washing with CHG (chlorahexidine gluconate) soap before surgery.   CHG is an antiseptic cleaner which kills germs and bonds with the skin to continue killing germs even after washing. Please DO NOT use if you have an allergy to CHG or antibacterial soaps.   If your skin becomes reddened/irritated stop using the CHG and inform your nurse when you arrive at Short Stay. Do not shave (including legs and underarms) for at least 48 hours prior to the first CHG shower.  Please follow these instructions carefully:  1.  Shower with CHG Soap the night before surgery and the  morning of Surgery.  2.  If you choose to wash your hair, wash your hair first as usual with your  normal  shampoo.  3.  After you shampoo, rinse your hair and body thoroughly to remove the  shampoo.                                        4.  Use CHG as you would any other liquid soap.  You can apply chg directly  to the skin and wash                       Gently with a scrungie or clean washcloth.  5.  Apply the CHG Soap to your body ONLY FROM THE NECK DOWN.   Do not use on face/ open                           Wound or open sores. Avoid contact with eyes, ears mouth and genitals (private parts).                       Wash face,  Genitals (private parts) with your normal soap.             6.  Wash thoroughly, paying special attention to the area where your surgery  will be performed.  7.  Thoroughly rinse your body with warm water from the neck down.  8.  DO NOT shower/wash with your normal soap after using and rinsing off  the CHG Soap.             9.  Pat yourself dry with a clean towel.            10.  Wear clean  pajamas.            11.  Place clean sheets on your bed the night of your first shower and do not  sleep with pets. Day of Surgery : Do not apply any lotions/deodorants the morning of surgery.  Please wear clean clothes to the hospital/surgery center.  FAILURE TO FOLLOW THESE INSTRUCTIONS MAY RESULT IN THE CANCELLATION OF YOUR SURGERY PATIENT SIGNATURE_________________________________  NURSE SIGNATURE__________________________________  ________________________________________________________________________   Michelle Holt  An incentive spirometer is a tool that can help keep your lungs clear and active. This tool measures how well you are filling your lungs with each breath. Taking long deep breaths may help reverse or decrease the chance of developing breathing (pulmonary) problems (especially infection) following:  A long period of time when you are unable to move or be active. BEFORE THE PROCEDURE   If the spirometer includes an indicator to show your best effort, your nurse or respiratory therapist will set it to a desired goal.  If possible, sit up straight or lean slightly forward. Try not to slouch.  Hold the incentive spirometer in an upright position. INSTRUCTIONS FOR USE  1. Sit on the edge of your bed if possible, or sit up as far as you can in bed or on a chair. 2. Hold the incentive spirometer in an upright position. 3. Breathe out normally. 4. Place the mouthpiece in your mouth and seal your lips tightly around it. 5. Breathe in slowly and  as deeply as possible, raising the piston or the ball toward the top of the column. 6. Hold your breath for 3-5 seconds or for as long as possible. Allow the piston or ball to fall to the bottom of the column. 7. Remove the mouthpiece from your mouth and breathe out normally. 8. Rest for a few seconds and repeat Steps 1 through 7 at least 10 times every 1-2 hours when you are awake. Take your time and take a few normal breaths  between deep breaths. 9. The spirometer may include an indicator to show your best effort. Use the indicator as a goal to work toward during each repetition. 10. After each set of 10 deep breaths, practice coughing to be sure your lungs are clear. If you have an incision (the cut made at the time of surgery), support your incision when coughing by placing a pillow or rolled up towels firmly against it. Once you are able to get out of bed, walk around indoors and cough well. You may stop using the incentive spirometer when instructed by your caregiver.  RISKS AND COMPLICATIONS  Take your time so you do not get dizzy or light-headed.  If you are in pain, you may need to take or ask for pain medication before doing incentive spirometry. It is harder to take a deep breath if you are having pain. AFTER USE  Rest and breathe slowly and easily.  It can be helpful to keep track of a log of your progress. Your caregiver can provide you with a simple table to help with this. If you are using the spirometer at home, follow these instructions: [place] IF:   You are having difficultly using the spirometer.  You have trouble using the spirometer as often as instructed.  Your pain medication is not giving enough relief while using the spirometer.  You develop fever of 100.5 F (38.1 C) or higher. SEEK IMMEDIATE MEDICAL CARE IF:   You cough up bloody sputum that had not been present before.  You develop fever of 102 F (38.9 C) or greater.  You develop worsening pain at or near the incision site. MAKE SURE YOU:   Understand these instructions.  Will watch your condition.  Will get help right away if you are not doing well or get worse. Document Released: 05/13/2006 Document Revised: 03/25/2011 Document Reviewed: 07/14/2006 [employer] Patient Information 2014 [employer], [place].   ________________________________________________________________________  WHAT IS A BLOOD TRANSFUSION?  Blood Transfusion Information  A transfusion is the replacement of blood or some of its parts. Blood is made up of multiple cells which provide different functions.  Red blood cells carry oxygen and are used for blood loss replacement.  White blood cells fight against infection.  Platelets control bleeding.  Plasma helps clot blood.  Other blood products are available for specialized needs, such as hemophilia or other clotting disorders. BEFORE THE TRANSFUSION  Who gives blood for transfusions?   Healthy volunteers who are fully evaluated to make sure their blood is safe. This is blood bank blood. Transfusion therapy is the safest it has ever been in the practice of medicine. Before blood is taken from a donor, a complete history is taken to make sure that person has no history of diseases nor engages in risky social behavior (examples are intravenous drug use or sexual activity with multiple partners). The donor's travel history is screened to minimize risk of transmitting infections, such as malaria. The donated blood is tested for signs of  infectious diseases, such as HIV and hepatitis. The blood is then tested to be sure it is compatible with you in order to minimize the chance of a transfusion reaction. If you or a relative donates blood, this is often done in anticipation of surgery and is not appropriate for emergency situations. It takes many days to process the donated blood. RISKS AND COMPLICATIONS Although transfusion therapy is very safe and saves many lives, the main dangers of transfusion include:   Getting an infectious disease.  Developing a transfusion reaction. This is an allergic reaction to something in the blood you were given. Every precaution is taken to prevent this. The decision to have a blood transfusion has been considered carefully by your caregiver before blood is given. Blood is not given unless the benefits outweigh the risks. AFTER THE TRANSFUSION  Right  after receiving a blood transfusion, you will usually feel much better and more energetic. This is especially true if your red blood cells have gotten low (anemic). The transfusion raises the level of the red blood cells which carry oxygen, and this usually causes an energy increase.  The nurse administering the transfusion will monitor you carefully for complications. HOME CARE INSTRUCTIONS  No special instructions are needed after a transfusion. You may find your energy is better. Speak with your caregiver about any limitations on activity for underlying diseases you may have. SEEK MEDICAL CARE IF:   Your condition is not improving after your transfusion.  You develop redness or irritation at the intravenous (IV) site. SEEK IMMEDIATE MEDICAL CARE IF:  Any of the following symptoms occur over the next 12 hours:  Shaking chills.  You have a temperature by mouth above 102 F (38.9 C), not controlled by medicine.  Chest, back, or muscle pain.  People around you feel you are not acting correctly or are confused.  Shortness of breath or difficulty breathing.  Dizziness and fainting.  You get a rash or develop hives.  You have a decrease in urine output.  Your urine turns a dark color or changes to pink, red, or brown. Any of the following symptoms occur over the next 10 days:  You have a temperature by mouth above 102 F (38.9 C), not controlled by medicine.  Shortness of breath.  Weakness after normal activity.  The white part of the eye turns yellow (jaundice).  You have a decrease in the amount of urine or are urinating less often.  Your urine turns a dark color or changes to pink, red, or brown. Document Released: 12/29/1999 Document Revised: 03/25/2011 Document Reviewed: 08/17/2007 [facility] Patient Information 2014 [place], [place].  _______________________________________________________________________

## 2018-12-23 NOTE — Telephone Encounter (Signed)
Left a message for Michelle Holt advising her that Covid testing will need to be on Friday, 12/25/18 which is 3 days before her surgery.  Also that the pre op testing nurse will discuss this with her tomorrow and that she can schedule it at [facility] if that is easier.

## 2018-12-24 ENCOUNTER — Encounter (HOSPITAL_COMMUNITY): Payer: Self-pay

## 2018-12-24 ENCOUNTER — Encounter (HOSPITAL_COMMUNITY)
Admission: RE | Admit: 2018-12-24 | Discharge: 2018-12-24 | Disposition: A | Discharge status: Home / Self Care | Payer: Medicaid Other | Source: Ambulatory Visit | Attending: Gynecologic Oncology | Admitting: Gynecologic Oncology

## 2018-12-24 ENCOUNTER — Other Ambulatory Visit: Payer: Self-pay

## 2018-12-24 DIAGNOSIS — C562 Malignant neoplasm of left ovary: Secondary | ICD-10-CM | POA: Insufficient documentation

## 2018-12-24 DIAGNOSIS — Z01818 Encounter for other preprocedural examination: Secondary | ICD-10-CM | POA: Insufficient documentation

## 2018-12-24 DIAGNOSIS — C561 Malignant neoplasm of right ovary: Secondary | ICD-10-CM | POA: Diagnosis not present

## 2018-12-24 HISTORY — DX: Personal history of urinary calculi: Z87.442

## 2018-12-24 HISTORY — DX: Unspecified mononeuropathy of unspecified upper limb: G56.90

## 2018-12-24 LAB — URINALYSIS, ROUTINE W REFLEX MICROSCOPIC
Bilirubin Urine: NEGATIVE
Glucose, UA: NEGATIVE mg/dL
Hgb urine dipstick: NEGATIVE
Ketones, ur: NEGATIVE mg/dL
Leukocytes,Ua: NEGATIVE
Nitrite: NEGATIVE
Protein, ur: NEGATIVE mg/dL
Specific Gravity, Urine: 1.005 (ref 1.005–1.030)
pH: 6 (ref 5.0–8.0)

## 2018-12-24 LAB — CA 125: Cancer Antigen (CA) 125: 72.4 U/mL — ABNORMAL HIGH (ref 0.0–38.1)

## 2018-12-24 NOTE — Telephone Encounter (Signed)
Called Ketia back and advised her that Joylene John, NP will address her post op pain medication before she goes home from her surgery.  She verbalized understanding and agreement.

## 2018-12-24 NOTE — Progress Notes (Signed)
PCP - Dr. Francesco Runner Cardiologist - no  Chest x-ray - 10/13/18 EKG - 07/08/18 Stress Test - 2017 ECHO - 2017 Cardiac Cath - no  Sleep Study - yes. Neg. results CPAP -   Fasting Blood Sugar - NA Checks Blood Sugar _____ times a day  Blood Thinner Instructions:NA Aspirin Instructions: Last Dose:  Anesthesia review:   Patient denies shortness of breath, fever, cough and chest pain at PAT appointment yes  Patient verbalized understanding of instructions that were given to them at the PAT appointment. Patient was also instructed that they will need to review over the PAT instructions again at home before surgery. yes

## 2018-12-25 ENCOUNTER — Other Ambulatory Visit (HOSPITAL_COMMUNITY)
Admission: RE | Admit: 2018-12-25 | Discharge: 2018-12-25 | Disposition: A | Discharge status: Home / Self Care | Payer: Medicaid Other | Source: Ambulatory Visit | Attending: Gynecologic Oncology | Admitting: Gynecologic Oncology

## 2018-12-25 DIAGNOSIS — Z20828 Contact with and (suspected) exposure to other viral communicable diseases: Secondary | ICD-10-CM | POA: Diagnosis not present

## 2018-12-25 DIAGNOSIS — Z01812 Encounter for preprocedural laboratory examination: Secondary | ICD-10-CM | POA: Diagnosis present

## 2018-12-25 LAB — SARS CORONAVIRUS 2 (TAT 6-24 HRS): SARS Coronavirus 2: NEGATIVE

## 2018-12-28 ENCOUNTER — Telehealth: Payer: Self-pay

## 2018-12-28 NOTE — Anesthesia Preprocedure Evaluation (Addendum)
Anesthesia Evaluation  Patient identified by MRN, date of birth, ID band Patient awake    Reviewed: Allergy & Precautions, NPO status , Patient's Chart, lab work & pertinent test results  Airway Mallampati: II  TM Distance: >3 FB Neck ROM: Full    Dental no notable dental hx.    Pulmonary former smoker,  Left pleural effusion   Pulmonary exam normal breath sounds clear to auscultation       Cardiovascular negative cardio ROS Normal cardiovascular exam Rhythm:Regular Rate:Normal  ECG: NSR, rate 91   Neuro/Psych  Headaches, PSYCHIATRIC DISORDERS Anxiety Depression    GI/Hepatic Neg liver ROS, Irritable bowel syndrome   Endo/Other  On chemotherapy  Renal/GU negative Renal ROS     Musculoskeletal negative musculoskeletal ROS (+)   Abdominal   Peds  Hematology HLD   Anesthesia Other Findings OVARIAN CANCER  Reproductive/Obstetrics                            Anesthesia Physical Anesthesia Plan  ASA: III  Anesthesia Plan: General   Post-op Pain Management:    Induction: Intravenous  PONV Risk Score and Plan: 4 or greater and Scopolamine patch - Pre-op, Midazolam, Dexamethasone, Ondansetron and Treatment may vary due to age or medical condition  Airway Management Planned: Oral ETT  Additional Equipment:   Intra-op Plan:   Post-operative Plan: Extubation in OR  Informed Consent: I have reviewed the patients History and Physical, chart, labs and discussed the procedure including the risks, benefits and alternatives for the proposed anesthesia with the patient or authorized representative who has indicated his/her understanding and acceptance.     Dental advisory given  Plan Discussed with: CRNA  Anesthesia Plan Comments:        Anesthesia Quick Evaluation

## 2018-12-28 NOTE — Telephone Encounter (Signed)
Pt stated that she has no questions regarding pre op instructions.

## 2018-12-29 ENCOUNTER — Ambulatory Visit (HOSPITAL_COMMUNITY): Payer: Medicaid Other | Admitting: Physician Assistant

## 2018-12-29 ENCOUNTER — Encounter (HOSPITAL_COMMUNITY): Payer: Self-pay | Admitting: Gynecologic Oncology

## 2018-12-29 ENCOUNTER — Ambulatory Visit (HOSPITAL_COMMUNITY): Payer: Medicaid Other | Admitting: Anesthesiology

## 2018-12-29 ENCOUNTER — Encounter (HOSPITAL_COMMUNITY)
Admission: RE | Disposition: A | Discharge status: Home / Self Care | Payer: Self-pay | Source: Home / Self Care | Attending: Gynecologic Oncology

## 2018-12-29 ENCOUNTER — Ambulatory Visit (HOSPITAL_COMMUNITY)
Admission: RE | Admit: 2018-12-29 | Discharge: 2018-12-29 | Disposition: A | Discharge status: Home / Self Care | Payer: Medicaid Other | Attending: Gynecologic Oncology | Admitting: Gynecologic Oncology

## 2018-12-29 DIAGNOSIS — C786 Secondary malignant neoplasm of retroperitoneum and peritoneum: Secondary | ICD-10-CM

## 2018-12-29 DIAGNOSIS — C782 Secondary malignant neoplasm of pleura: Secondary | ICD-10-CM | POA: Diagnosis not present

## 2018-12-29 DIAGNOSIS — C561 Malignant neoplasm of right ovary: Secondary | ICD-10-CM

## 2018-12-29 DIAGNOSIS — Z803 Family history of malignant neoplasm of breast: Secondary | ICD-10-CM | POA: Diagnosis not present

## 2018-12-29 DIAGNOSIS — F419 Anxiety disorder, unspecified: Secondary | ICD-10-CM | POA: Insufficient documentation

## 2018-12-29 DIAGNOSIS — Z79899 Other long term (current) drug therapy: Secondary | ICD-10-CM | POA: Insufficient documentation

## 2018-12-29 DIAGNOSIS — C563 Malignant neoplasm of bilateral ovaries: Secondary | ICD-10-CM | POA: Diagnosis present

## 2018-12-29 DIAGNOSIS — Z880 Allergy status to penicillin: Secondary | ICD-10-CM | POA: Insufficient documentation

## 2018-12-29 DIAGNOSIS — Z87891 Personal history of nicotine dependence: Secondary | ICD-10-CM | POA: Insufficient documentation

## 2018-12-29 DIAGNOSIS — Z888 Allergy status to other drugs, medicaments and biological substances status: Secondary | ICD-10-CM | POA: Insufficient documentation

## 2018-12-29 DIAGNOSIS — Z881 Allergy status to other antibiotic agents status: Secondary | ICD-10-CM | POA: Diagnosis not present

## 2018-12-29 DIAGNOSIS — C7982 Secondary malignant neoplasm of genital organs: Secondary | ICD-10-CM

## 2018-12-29 DIAGNOSIS — Z808 Family history of malignant neoplasm of other organs or systems: Secondary | ICD-10-CM | POA: Diagnosis not present

## 2018-12-29 DIAGNOSIS — C562 Malignant neoplasm of left ovary: Secondary | ICD-10-CM | POA: Diagnosis not present

## 2018-12-29 DIAGNOSIS — Z885 Allergy status to narcotic agent status: Secondary | ICD-10-CM | POA: Diagnosis not present

## 2018-12-29 DIAGNOSIS — C5701 Malignant neoplasm of right fallopian tube: Secondary | ICD-10-CM | POA: Diagnosis not present

## 2018-12-29 DIAGNOSIS — G43909 Migraine, unspecified, not intractable, without status migrainosus: Secondary | ICD-10-CM | POA: Diagnosis not present

## 2018-12-29 DIAGNOSIS — C569 Malignant neoplasm of unspecified ovary: Secondary | ICD-10-CM | POA: Diagnosis present

## 2018-12-29 DIAGNOSIS — F329 Major depressive disorder, single episode, unspecified: Secondary | ICD-10-CM | POA: Diagnosis not present

## 2018-12-29 DIAGNOSIS — Z9221 Personal history of antineoplastic chemotherapy: Secondary | ICD-10-CM | POA: Diagnosis not present

## 2018-12-29 LAB — TYPE AND SCREEN
ABO/RH(D): O POS
Antibody Screen: NEGATIVE

## 2018-12-29 LAB — PREGNANCY, URINE: Preg Test, Ur: NEGATIVE

## 2018-12-29 LAB — ABO/RH: ABO/RH(D): O POS

## 2018-12-29 SURGERY — XI ROBOTIC ASSISTED TOTAL HYSTERECTOMY BILATERAL SALPINGO OOPHORECTOMY WITH OMENTECTOMY AND DEBULKING
Anesthesia: General

## 2018-12-29 MED ORDER — KETOROLAC TROMETHAMINE 30 MG/ML IJ SOLN
INTRAMUSCULAR | Status: AC
  Administered 2018-12-29: 30 mg via INTRAVENOUS
  Filled 2018-12-29: qty 1

## 2018-12-29 MED ORDER — OXYCODONE HCL 5 MG PO TABS
ORAL_TABLET | ORAL | Status: AC
  Administered 2018-12-29: 10 mg via ORAL
  Filled 2018-12-29: qty 2

## 2018-12-29 MED ORDER — MIDAZOLAM HCL 2 MG/2ML IJ SOLN
INTRAMUSCULAR | Status: AC
  Filled 2018-12-29: qty 2

## 2018-12-29 MED ORDER — ENOXAPARIN SODIUM 40 MG/0.4ML ~~LOC~~ SOLN
40.0000 mg | SUBCUTANEOUS | Status: AC
  Administered 2018-12-29: 40 mg via SUBCUTANEOUS
  Filled 2018-12-29: qty 0.4

## 2018-12-29 MED ORDER — LIDOCAINE HCL 2 % IJ SOLN
INTRAMUSCULAR | Status: AC
  Filled 2018-12-29: qty 20

## 2018-12-29 MED ORDER — PHENYLEPHRINE 40 MCG/ML (10ML) SYRINGE FOR IV PUSH (FOR BLOOD PRESSURE SUPPORT)
PREFILLED_SYRINGE | INTRAVENOUS | Status: DC | PRN
  Administered 2018-12-29: 40 ug via INTRAVENOUS
  Administered 2018-12-29: 120 ug via INTRAVENOUS
  Administered 2018-12-29: 80 ug via INTRAVENOUS

## 2018-12-29 MED ORDER — LACTATED RINGERS IR SOLN
Status: DC | PRN
  Administered 2018-12-29: 1000 mL

## 2018-12-29 MED ORDER — ACETAMINOPHEN 500 MG PO TABS
1000.0000 mg | ORAL_TABLET | ORAL | Status: AC
  Administered 2018-12-29: 1000 mg via ORAL
  Filled 2018-12-29: qty 2

## 2018-12-29 MED ORDER — ONDANSETRON HCL 4 MG/2ML IJ SOLN
INTRAMUSCULAR | Status: DC | PRN
  Administered 2018-12-29: 4 mg via INTRAVENOUS

## 2018-12-29 MED ORDER — PROPOFOL 10 MG/ML IV BOLUS
INTRAVENOUS | Status: DC | PRN
  Administered 2018-12-29: 100 mg via INTRAVENOUS
  Administered 2018-12-29: 50 mg via INTRAVENOUS

## 2018-12-29 MED ORDER — DEXAMETHASONE SODIUM PHOSPHATE 4 MG/ML IJ SOLN: 4.0000 mg | INTRAMUSCULAR | Status: DC

## 2018-12-29 MED ORDER — PROPOFOL 10 MG/ML IV BOLUS
INTRAVENOUS | Status: AC
  Filled 2018-12-29: qty 20

## 2018-12-29 MED ORDER — SODIUM CHLORIDE 0.9% FLUSH: 3.0000 mL | INTRAVENOUS | Status: DC | PRN

## 2018-12-29 MED ORDER — HYDROMORPHONE HCL 1 MG/ML IJ SOLN
INTRAMUSCULAR | Status: AC
  Filled 2018-12-29: qty 1

## 2018-12-29 MED ORDER — ACETAMINOPHEN 325 MG PO TABS: 650.0000 mg | ORAL_TABLET | ORAL | Status: DC | PRN

## 2018-12-29 MED ORDER — PROMETHAZINE HCL 25 MG/ML IJ SOLN
INTRAMUSCULAR | Status: AC
  Filled 2018-12-29: qty 1

## 2018-12-29 MED ORDER — ROCURONIUM BROMIDE 10 MG/ML (PF) SYRINGE
PREFILLED_SYRINGE | INTRAVENOUS | Status: DC | PRN
  Administered 2018-12-29: 20 mg via INTRAVENOUS
  Administered 2018-12-29: 60 mg via INTRAVENOUS

## 2018-12-29 MED ORDER — ONDANSETRON HCL 4 MG/2ML IJ SOLN
INTRAMUSCULAR | Status: AC
  Filled 2018-12-29: qty 2

## 2018-12-29 MED ORDER — LIDOCAINE IN D5W 4-5 MG/ML-% IV SOLN
INTRAVENOUS | Status: DC | PRN
  Administered 2018-12-29: 1 ug/kg/min via INTRAVENOUS

## 2018-12-29 MED ORDER — PROMETHAZINE HCL 25 MG/ML IJ SOLN
6.2500 mg | INTRAMUSCULAR | Status: DC | PRN
  Administered 2018-12-29: 12.5 mg via INTRAVENOUS

## 2018-12-29 MED ORDER — DEXAMETHASONE SODIUM PHOSPHATE 10 MG/ML IJ SOLN
INTRAMUSCULAR | Status: AC
  Filled 2018-12-29: qty 1

## 2018-12-29 MED ORDER — SUGAMMADEX SODIUM 200 MG/2ML IV SOLN
INTRAVENOUS | Status: DC | PRN
  Administered 2018-12-29: 200 mg via INTRAVENOUS

## 2018-12-29 MED ORDER — DEXAMETHASONE SODIUM PHOSPHATE 10 MG/ML IJ SOLN
INTRAMUSCULAR | Status: DC | PRN
  Administered 2018-12-29: 8 mg via INTRAVENOUS

## 2018-12-29 MED ORDER — CELECOXIB 200 MG PO CAPS
400.0000 mg | ORAL_CAPSULE | ORAL | Status: AC
  Administered 2018-12-29: 400 mg via ORAL
  Filled 2018-12-29: qty 2

## 2018-12-29 MED ORDER — BUPIVACAINE HCL (PF) 0.25 % IJ SOLN
INTRAMUSCULAR | Status: AC
  Filled 2018-12-29: qty 30

## 2018-12-29 MED ORDER — LACTATED RINGERS IV SOLN: INTRAVENOUS | Status: DC

## 2018-12-29 MED ORDER — FENTANYL CITRATE (PF) 100 MCG/2ML IJ SOLN
INTRAMUSCULAR | Status: AC
  Filled 2018-12-29: qty 2

## 2018-12-29 MED ORDER — MIDAZOLAM HCL 5 MG/5ML IJ SOLN
INTRAMUSCULAR | Status: DC | PRN
  Administered 2018-12-29: 2 mg via INTRAVENOUS

## 2018-12-29 MED ORDER — ACETAMINOPHEN 650 MG RE SUPP
650.0000 mg | RECTAL | Status: DC | PRN
  Filled 2018-12-29: qty 1

## 2018-12-29 MED ORDER — SCOPOLAMINE 1 MG/3DAYS TD PT72
1.0000 | MEDICATED_PATCH | TRANSDERMAL | Status: DC
  Administered 2018-12-29: 1.5 mg via TRANSDERMAL
  Filled 2018-12-29: qty 1

## 2018-12-29 MED ORDER — KETOROLAC TROMETHAMINE 30 MG/ML IJ SOLN: 30.0000 mg | Freq: Once | INTRAMUSCULAR | Status: AC | PRN

## 2018-12-29 MED ORDER — OXYCODONE HCL 5 MG PO TABS: 5.0000 mg | ORAL_TABLET | ORAL | 0 refills | Status: DC | PRN

## 2018-12-29 MED ORDER — OXYCODONE HCL 5 MG PO TABS: 5.0000 mg | ORAL_TABLET | ORAL | Status: DC | PRN

## 2018-12-29 MED ORDER — KETAMINE HCL 10 MG/ML IJ SOLN
INTRAMUSCULAR | Status: DC | PRN
  Administered 2018-12-29: 20 mg via INTRAVENOUS
  Administered 2018-12-29: 30 mg via INTRAVENOUS

## 2018-12-29 MED ORDER — FENTANYL CITRATE (PF) 100 MCG/2ML IJ SOLN
INTRAMUSCULAR | Status: DC | PRN
  Administered 2018-12-29: 100 ug via INTRAVENOUS

## 2018-12-29 MED ORDER — STERILE WATER FOR IRRIGATION IR SOLN
Status: DC | PRN
  Administered 2018-12-29: 1000 mL

## 2018-12-29 MED ORDER — SODIUM CHLORIDE 0.9 % IV SOLN: 250.0000 mL | INTRAVENOUS | Status: DC | PRN

## 2018-12-29 MED ORDER — CLINDAMYCIN PHOSPHATE 900 MG/50ML IV SOLN
900.0000 mg | INTRAVENOUS | Status: AC
  Administered 2018-12-29: 900 mg via INTRAVENOUS
  Filled 2018-12-29: qty 50

## 2018-12-29 MED ORDER — GABAPENTIN 300 MG PO CAPS
300.0000 mg | ORAL_CAPSULE | ORAL | Status: AC
  Administered 2018-12-29: 300 mg via ORAL
  Filled 2018-12-29: qty 1

## 2018-12-29 MED ORDER — BUPIVACAINE HCL 0.25 % IJ SOLN
INTRAMUSCULAR | Status: DC | PRN
  Administered 2018-12-29: 20 mL

## 2018-12-29 MED ORDER — HYDROMORPHONE HCL 1 MG/ML IJ SOLN
0.2500 mg | INTRAMUSCULAR | Status: DC | PRN
  Administered 2018-12-29 (×3): 0.5 mg via INTRAVENOUS

## 2018-12-29 MED ORDER — LIDOCAINE 2% (20 MG/ML) 5 ML SYRINGE
INTRAMUSCULAR | Status: AC
  Filled 2018-12-29: qty 5

## 2018-12-29 MED ORDER — KETAMINE HCL 10 MG/ML IJ SOLN
INTRAMUSCULAR | Status: AC
  Filled 2018-12-29: qty 1

## 2018-12-29 MED ORDER — SODIUM CHLORIDE 0.9% FLUSH: 3.0000 mL | Freq: Two times a day (BID) | INTRAVENOUS | Status: DC

## 2018-12-29 MED ORDER — CIPROFLOXACIN IN D5W 400 MG/200ML IV SOLN
400.0000 mg | INTRAVENOUS | Status: AC
  Administered 2018-12-29: 400 mg via INTRAVENOUS
  Filled 2018-12-29: qty 200

## 2018-12-29 MED ORDER — MORPHINE SULFATE (PF) 4 MG/ML IV SOLN: 2.0000 mg | INTRAVENOUS | Status: DC | PRN

## 2018-12-29 MED ORDER — LIDOCAINE 2% (20 MG/ML) 5 ML SYRINGE
INTRAMUSCULAR | Status: DC | PRN
  Administered 2018-12-29: 60 mg via INTRAVENOUS

## 2018-12-29 MED ORDER — ROCURONIUM BROMIDE 10 MG/ML (PF) SYRINGE
PREFILLED_SYRINGE | INTRAVENOUS | Status: AC
  Filled 2018-12-29: qty 10

## 2018-12-29 SURGICAL SUPPLY — 70 items
ADH SKN CLS APL DERMABOND .7 (GAUZE/BANDAGES/DRESSINGS) ×1
AGENT HMST KT MTR STRL THRMB (HEMOSTASIS)
APL ESCP 34 STRL LF DISP (HEMOSTASIS)
APPLICATOR SURGIFLO ENDO (HEMOSTASIS) IMPLANT
BAG LAPAROSCOPIC 12 15 PORT 16 (BASKET) IMPLANT
BAG RETRIEVAL 12/15 (BASKET) ×2
BAG SPEC RTRVL LRG 6X4 10 (ENDOMECHANICALS)
BLADE SURG SZ10 CARB STEEL (BLADE) IMPLANT
COVER BACK TABLE 60X90IN (DRAPES) ×2 IMPLANT
COVER TIP SHEARS 8 DVNC (MISCELLANEOUS) ×1 IMPLANT
COVER TIP SHEARS 8MM DA VINCI (MISCELLANEOUS) ×1
COVER WAND RF STERILE (DRAPES) IMPLANT
DECANTER SPIKE VIAL GLASS SM (MISCELLANEOUS) IMPLANT
DERMABOND ADVANCED (GAUZE/BANDAGES/DRESSINGS) ×1
DERMABOND ADVANCED .7 DNX12 (GAUZE/BANDAGES/DRESSINGS) ×1 IMPLANT
DRAPE ARM DVNC X/XI (DISPOSABLE) ×4 IMPLANT
DRAPE COLUMN DVNC XI (DISPOSABLE) ×1 IMPLANT
DRAPE DA VINCI XI ARM (DISPOSABLE) ×4
DRAPE DA VINCI XI COLUMN (DISPOSABLE) ×1
DRAPE SHEET LG 3/4 BI-LAMINATE (DRAPES) ×2 IMPLANT
DRAPE SURG IRRIG POUCH 19X23 (DRAPES) ×2 IMPLANT
DRSG OPSITE POSTOP 4X6 (GAUZE/BANDAGES/DRESSINGS) IMPLANT
DRSG OPSITE POSTOP 4X8 (GAUZE/BANDAGES/DRESSINGS) IMPLANT
ELECT REM PT RETURN 15FT ADLT (MISCELLANEOUS) ×2 IMPLANT
GLOVE BIO SURGEON STRL SZ 6 (GLOVE) ×8 IMPLANT
GLOVE BIO SURGEON STRL SZ 6.5 (GLOVE) ×2 IMPLANT
GOWN STRL REUS W/ TWL LRG LVL3 (GOWN DISPOSABLE) ×4 IMPLANT
GOWN STRL REUS W/TWL LRG LVL3 (GOWN DISPOSABLE) ×8
HOLDER FOLEY CATH W/STRAP (MISCELLANEOUS) ×1 IMPLANT
IRRIG SUCT STRYKERFLOW 2 WTIP (MISCELLANEOUS) ×2
IRRIGATION SUCT STRKRFLW 2 WTP (MISCELLANEOUS) ×1 IMPLANT
KIT PROCEDURE DA VINCI SI (MISCELLANEOUS)
KIT PROCEDURE DVNC SI (MISCELLANEOUS) IMPLANT
KIT TURNOVER KIT A (KITS) ×1 IMPLANT
MANIPULATOR UTERINE 4.5 ZUMI (MISCELLANEOUS) ×2 IMPLANT
NDL SPNL 18GX3.5 QUINCKE PK (NEEDLE) IMPLANT
NEEDLE HYPO 22GX1.5 SAFETY (NEEDLE) ×1 IMPLANT
NEEDLE SPNL 18GX3.5 QUINCKE PK (NEEDLE) IMPLANT
OBTURATOR OPTICAL STANDARD 8MM (TROCAR) ×1
OBTURATOR OPTICAL STND 8 DVNC (TROCAR) ×1
OBTURATOR OPTICALSTD 8 DVNC (TROCAR) ×1 IMPLANT
PACK ROBOT GYN CUSTOM WL (TRAY / TRAY PROCEDURE) ×2 IMPLANT
PAD POSITIONING PINK XL (MISCELLANEOUS) ×2 IMPLANT
PENCIL SMOKE EVACUATOR (MISCELLANEOUS) IMPLANT
PORT ACCESS TROCAR AIRSEAL 12 (TROCAR) ×1 IMPLANT
PORT ACCESS TROCAR AIRSEAL 5M (TROCAR) ×1
POUCH SPECIMEN RETRIEVAL 10MM (ENDOMECHANICALS) IMPLANT
SCRUB CHG 4% DYNA-HEX 4OZ (MISCELLANEOUS) ×2 IMPLANT
SEAL CANN UNIV 5-8 DVNC XI (MISCELLANEOUS) ×3 IMPLANT
SEAL XI 5MM-8MM UNIVERSAL (MISCELLANEOUS) ×4
SEALER VESSEL DA VINCI XI (MISCELLANEOUS) ×1
SEALER VESSEL EXT DVNC XI (MISCELLANEOUS) IMPLANT
SET TRI-LUMEN FLTR TB AIRSEAL (TUBING) ×2 IMPLANT
SPONGE LAP 18X18 X RAY DECT (DISPOSABLE) IMPLANT
SURGIFLO W/THROMBIN 8M KIT (HEMOSTASIS) IMPLANT
SUT MNCRL AB 4-0 PS2 18 (SUTURE) IMPLANT
SUT PDS AB 1 TP1 96 (SUTURE) IMPLANT
SUT VIC AB 0 CT1 27 (SUTURE)
SUT VIC AB 0 CT1 27XBRD ANTBC (SUTURE) IMPLANT
SUT VIC AB 2-0 CT1 27 (SUTURE)
SUT VIC AB 2-0 CT1 TAPERPNT 27 (SUTURE) IMPLANT
SUT VICRYL 4-0 PS2 18IN ABS (SUTURE) ×4 IMPLANT
SYR 10ML LL (SYRINGE) IMPLANT
TOWEL OR NON WOVEN STRL DISP B (DISPOSABLE) ×2 IMPLANT
TRAP SPECIMEN MUCOUS 40CC (MISCELLANEOUS) IMPLANT
TRAY FOLEY MTR SLVR 16FR STAT (SET/KITS/TRAYS/PACK) ×2 IMPLANT
TROCAR XCEL NON-BLD 5MMX100MML (ENDOMECHANICALS) IMPLANT
UNDERPAD 30X36 HEAVY ABSORB (UNDERPADS AND DIAPERS) ×2 IMPLANT
WATER STERILE IRR 1000ML POUR (IV SOLUTION) ×2 IMPLANT
YANKAUER SUCT BULB TIP 10FT TU (MISCELLANEOUS) IMPLANT

## 2018-12-29 NOTE — H&P (Signed)
H&P Note: Gyn-Onc  Consult was initially requested by Dr. Delton Coombes for the evaluation of Michelle Holt 48 y.o. female  CC:  Stage IV ovarian cancer.  Assessment/Plan:  Michelle Holt  is a 48 y.o.  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 BRCA negative (germline).   Status post 3 cycles of neoadjuvant carboplatin paclitaxel.  Significant treatment delays due to personal issues mid therapy.  We plan on a surgery for interval debulking with robotic assisted total hysterectomy BSO omentectomy possible laparotomy.  Following that she will require 3 additional cycles of carboplatin paclitaxel.  I explained the surgery to her including the potential complications and the anticipated recovery.  Based on my exam and review of her last CT scan is a high probability this will be up to be accomplished a minimally invasive route which will optimize her return to both assisting with her son's rehab as well as resuming adjuvant chemotherapy.  HPI: Michelle Holt is a 48 year old P2 who is seen in consultation at the request of Dr.Katragadda for evaluation of stage IV ovarian cancer.  The patient's history began in June 2020 when she experienced symptoms of shortness of breath.  She was seen and evaluated with a chest x-ray which showed bilateral pleural effusions and she underwent thoracentesis on July 07, 2018.  This revealed pleural fluid which was positive for adenocarcinoma favor GYN primary due to cytokeratin 7+ p53 positive WT 1+ PAX 8+ and weak ER positive.  CK20 was negative.  Post drainage she developed back pain.  A CT scan of the chest on July 09, 2018 which showed a moderate loculated left hydropneumothorax with a small air component and moderate fluid component.  The largest loculated component was located posteriorly.  There is a pigtail drainage catheter in the lateral space.  No obvious etiology was appreciated such as obvious mass or pleural disease.  There was  also a small right pleural effusion with associated atelectasis.  It was of uncertain significance.  Due to the epigastric pain that she was continuing to experience, she underwent a CT scan of the abdomen and pelvis on July 10, 2018.  This revealed bilateral ovaries enlarged by heterogeneous appearing cystic lesions measuring 5.3 x 4.2 cm on the right, and 4.5 x 3.2 cm on the left.  No other masses, lymphadenopathy was seen.  There was trace ascites.  There was some suggestion of omental and peritoneal nodularity concerning for peritoneal metastatic disease.  She underwent VATS procedure and left pleural peeling.  This showed fibrinous pleuritis negative for malignancy.  This was performed on July 13, 2018.  CA 125 on 07/19/18 was elevated at 581.   An EGD was performed on July 20, 2018.  This showed a nonbleeding gastric ulcer, duodenal erosions without bleeding, and a normal esophagus.  It was not felt to be the cause of a worsening anemia that was diagnosed at that time.  A PET on 07/31/18 showed hypermetabolic tissue associated with cystic masses of the LEFT and RIGHT ovary consistent with ovarian carcinoma. Evidence of carcinoma metastasis with rind of hypermetabolic tissue along the inferior margin of liver RIGHT hepatic lobe as well as on along the ventral LEFT peritoneal space and greater omentum. Hypermetabolic tissue in the LEFT pleural space is related to talc pleurodesis. Cannot exclude underlying malignancy. Incidental finding of a large hematoma in LEFT upper quadrant bound superiorly by the LEFT hemidiaphragm and inferiorly by the spleen.   She had persistent left upper  quadrant and epigastric discomfort and was readmitted.  She was eventually diagnosed with a splenic laceration thought to be secondary to her thoracentesis or VATS procedure.  She underwent splenic artery embolization in July 2020.  Since that time she has had worsening left upper quadrant pains.  Of note a thoracentesis had  been placed in June on the left.  It is remained in place since that time but stopped draining fluid in late July 2020.  She met with Dr.Katragadda who counseled her regarding the options of neoadjuvant chemotherapy followed by interval cytoreduction and additional chemotherapy.  The patient was initially cautious about undergoing aggressive chemotherapy as her husband died in 16-Mar-2016 from advanced malignancy and she was concerned about quality of life diminution.  Her past medical history is scanty as she has had infrequent medical care.  She does report a history of hypercholesterolemia.  She had 2 prior vaginal births.  She denies uterine procedures other than an endometrial ablation for benign disease in 17-Mar-2003 and has been amenorrheic since that time.  She has had a cholecystectomy.  She had no other abdominal surgeries.  Her family history is significant for her being 1 of 12 children.  She has a maternal aunt who has a history of breast cancer, and a sister who was diagnosed with uterine cancer at age 26, she was overweight, it was a stage I disease.  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  She is not walking while receiving treatment for her cancer.  CA 125 on 08/24/18 prior to commencing treatment was 1776-03-16.   She commenced neoadjuvant chemotherapy on 08/24/18. She received 3 cycles with cycle 3 on 10/05/18. She found chemotherapy very difficult with nausea and profound fatigue. Immediately following cycle 3, her son was involved in an ATV accident and suffered a severe brain injury and was in the ICU for 2 months. During that time the patient cancelled her planned interval debulking date and elected for a hold on chemotherapy so that she could be available to travel regularly to [place] to be with him.  CT scan on 10/13/18 following cycle 3 showed near complete interval resolution of a previously seen left pleural effusion, now trace, with residual pleural thickening. Interval decrease in  size of complex cystic lesions of the  ovaries, a right-sided lesion measuring 4.6 x 3.6 cm, previously 5.8 x 4.5 cm when measured similarly and a left-sided lesion measuring 4.6 x 3.8 cm,previously 4.7 x 4.7 cm when measured similarly.There is very faint residual peritoneal and omental thickening and stranding, which is improved when compared to baseline PET-CT examination dated 07/31/2018.  CA 125 on 11/06/18 was 144.   Genetic testing was negative for germline BRCA mutations.   Interval Hx:  She was seen in November and due to treatment delays, it was determined that she should have another cycle of neoadjuvant chemotherapy (cycle 4) followed by repeat imaging and labs then interval debulking if she was continuing to show response.  On 12/21/18 CT scan showed continued interval decrease in size of bilateral complex ovarian lesions, right ovarian lesion measuring 3.2 x 2.2 cm, previously 4.6 x 3.6 cm, left ovarian lesion measuring 4.2 x 3.4 cm, previously 4.6 x 3.8 cm. Findings are consistent with ongoing treatment response. There remains essentially no residual metastatic peritoneal or omental thickening/nodularity. No evidence of new metastatic disease in the abdomen or pelvis. No change in residual appearance of a previous splenic hemorrhage, with oral material in the distal splenic artery  On 12/22/18 CA 125 was decreased to 72  Current Meds:  Outpatient Encounter Medications as of 11/18/2018  Medication Sig  . dexamethasone (DECADRON) 4 MG tablet TAKE ONE TABLET BY MOUTH DAILY.  . diazepam (VALIUM) 5 MG tablet Take 1 tablet (5 mg total) by mouth at bedtime.  . dicyclomine (BENTYL) 10 MG capsule TAKE ONE CAPSULE BY MOUTH FOUR TIMES DAILY. TAKE BEFORE MEALS AND AT BEDTIME.  Marland Kitchen lidocaine-prilocaine (EMLA) cream Apply small amount to port a cath site and cover with plastic wrap one hour prior to chemotherapy appointments  . ondansetron (ZOFRAN ODT) 8 MG disintegrating tablet Take 1 tablet (8 mg  total) by mouth every 8 (eight) hours as needed for nausea or vomiting.  . Oxycodone HCl 10 MG TABS Take 1 tablet (10 mg total) by mouth every 4 (four) hours as needed.  . pantoprazole (PROTONIX) 40 MG tablet Take 1 tablet (40 mg total) by mouth 2 (two) times daily before a meal.  . polyethylene glycol (MIRALAX / GLYCOLAX) 17 g packet Take 17 g by mouth as needed.  . promethazine (PHENERGAN) 25 MG tablet Take 1 tablet (25 mg total) by mouth every 6 (six) hours as needed for nausea, vomiting or refractory nausea / vomiting.  . SUMAtriptan (IMITREX) 100 MG tablet TAKE ONE TABLET BY MOUTH PRN UP to TWICE DAILY AS NEEDED.  Marland Kitchen CARBOPLATIN IV Inject into the vein every 21 ( twenty-one) days.  Marland Kitchen PACLITAXEL IV Inject into the vein every 21 ( twenty-one) days.   No facility-administered encounter medications on file as of 11/18/2018.     Allergy:  Allergies  Allergen Reactions  . Morphine And Related Itching  . Nortriptyline Other (See Comments)    Significant weight gain  . Topamax [Topiramate] Diarrhea and Nausea Only  . Actifed Cold-Allergy [Chlorpheniramine-Phenyleph Er] Rash  . Amoxicillin Rash    Did it involve swelling of the face/tongue/throat, SOB, or low BP? Unknown Did it involve sudden or severe rash/hives, skin peeling, or any reaction on the inside of your mouth or nose? Unknown Did you need to seek medical attention at a hospital or doctor's office? Unknown When did it last happen? teenager If all above answers are "NO", may proceed with cephalosporin use.   . Codeine Hives  . Erythromycin Rash  . Penicillins Rash    Did it involve swelling of the face/tongue/throat, SOB, or low BP? Unknown Did it involve sudden or severe rash/hives, skin peeling, or any reaction on the inside of your mouth or nose? Unknown Did you need to seek medical attention at a hospital or doctor's office? Unknown When did it last happen? teenager If all above answers are "NO", may proceed with  cephalosporin use.   [name] [Pseudoephedrine Hcl] Rash    Social Hx:   Social History   Socioeconomic History  . Marital status: Widowed    Spouse name: Not on file  . Number of children: 2  . Years of education: 2-College  . Highest education level: Not on file  Occupational History    Employer: [employer]  Tobacco Use  . Smoking status: Former Smoker    Packs/day: 0.50    Years: 17.00    Pack years: 8.50    Types: Cigarettes    Quit date: 06/22/2018    Years since quitting: 0.5  . Smokeless tobacco: Never Used  Substance and Sexual Activity  . Alcohol use: Yes    Alcohol/week: 1.0 standard drinks    Types: 1 Glasses of wine per  week    Comment: Drinks alcohol on occasion  . Drug use: No  . Sexual activity: Not on file  Other Topics Concern  . Not on file  Social History Narrative   Patient lives at home with her daughter.    Patient has 2 children.    Patient is widowed.    Patient is right handed.    Patient has her Associates degree.      Social Determinants of Health   Financial Resource Strain: Low Risk   . Difficulty of Paying Living Expenses: Not hard at all  Food Insecurity: No Food Insecurity  . Worried About Charity fundraiser in the Last Year: Never true  . Ran Out of Food in the Last Year: Never true  Transportation Needs: No Transportation Needs  . Lack of Transportation (Medical): No  . Lack of Transportation (Non-Medical): No  Physical Activity: Inactive  . Days of Exercise per Week: 0 days  . Minutes of Exercise per Session: 0 min  Stress: Stress Concern Present  . Feeling of Stress : Very much  Social Connections: Somewhat Isolated  . Frequency of Communication with Friends and Family: More than three times a week  . Frequency of Social Gatherings with Friends and Family: More than three times a week  . Attends Religious Services: 1 to 4 times per year  . Active Member of Clubs or Organizations: No  . Attends Archivist Meetings:  Never  . Marital Status: Widowed  Intimate Partner Violence: Not At Risk  . Fear of Current or Ex-Partner: No  . Emotionally Abused: No  . Physically Abused: No  . Sexually Abused: No    Past Surgical Hx:  Past Surgical History:  Procedure Laterality Date  . CHOLECYSTECTOMY  2008  . COLONOSCOPY N/A 08/13/2013   Procedure: COLONOSCOPY;  Surgeon: Rogene Houston, MD;  [facility];  Service: Endoscopy;  Laterality: N/A;  230-moved to 145 Ann to notify pt  . ESOPHAGOGASTRODUODENOSCOPY    . ESOPHAGOGASTRODUODENOSCOPY (EGD) WITH PROPOFOL N/A 07/20/2018   Procedure: ESOPHAGOGASTRODUODENOSCOPY (EGD) WITH PROPOFOL;  Surgeon: Rogene Houston, MD;  [facility];  Service: Endoscopy;  Laterality: N/A;  Possible esophageal dilation.  . IR ANGIOGRAM SELECTIVE EACH ADDITIONAL VESSEL  08/01/2018  . IR ANGIOGRAM VISCERAL SELECTIVE  08/01/2018  . IR EMBO ART  VEN HEMORR LYMPH EXTRAV  INC GUIDE ROADMAPPING  08/01/2018  . IR IMAGING GUIDED PORT INSERTION  08/20/2018  . IR PERC PLEURAL DRAIN W/INDWELL CATH W/IMG GUIDE  07/08/2018  . IR THORACENTESIS ASP PLEURAL SPACE W/IMG GUIDE  07/07/2018  . IR US GUIDE VASC ACCESS RIGHT  08/01/2018  . PLEURAL EFFUSION DRAINAGE Left 07/13/2018   Procedure: DRAINAGE OF LOCULATED PLEURAL EFFUSION;  Surgeon: Ivin Poot, MD;  Location: [facility];  Service: Thoracic;  Laterality: Left;  . REMOVAL OF PLEURAL DRAINAGE CATHETER Left 08/20/2018   Procedure: REMOVAL OF PLEURAL DRAINAGE CATHETER;  Surgeon: Ivin Poot, MD;  Location: [facility];  Service: Thoracic;  Laterality: Left;  . REMOVAL OF PLEURAL DRAINAGE CATHETER Left 08/20/2018   Procedure: REMOVAL OF PLEURAL DRAINAGE CATHETER;  Surgeon: Ivin Poot, MD;  Location: [facility];  Service: Thoracic;  Laterality: Left;  . TALC PLEURODESIS Left 07/13/2018   Procedure: Talc Pleuradesis;  Surgeon: Prescott Gum, Collier Salina, MD;  Location: [facility];  Service: Thoracic;  Laterality: Left;  . TUBAL LIGATION Bilateral   . UTERINE  ABLATION    . VIDEO ASSISTED THORACOSCOPY Left 07/13/2018  Procedure: VIDEO ASSISTED THORACOSCOPY;  Surgeon: Prescott Gum, Collier Salina, MD;  Location: [facility];  Service: Thoracic;  Laterality: Left;    Past Medical Hx:  Past Medical History:  Diagnosis Date  . Anxiety and depression   . Arthritis of facet joints at multiple vertebral levels    L5-S1  . Constipation   . Dyslipidemia   . Family history of breast cancer   . Family history of uterine cancer   . History of kidney stones   . Insomnia   . Irritable bowel syndrome   . Migraine   . Muscle tension headache   . Neuropathy of finger   . Ovarian carcinoma ([place])   . Plantar fasciitis of right foot   . Port-A-Cath in place 08/20/2018    Past Gynecological History:  See HPI No LMP recorded. Patient has had an ablation.  Family Hx:  Family History  Problem Relation Age of Onset  . Hypertension Mother   . Obesity Mother   . Diabetes Mother   . Kidney disease Mother   . Peripheral vascular disease Father   . Atrial fibrillation Father   . COPD Brother   . Osteoporosis Brother   . Crohn's disease Sister   . Uterine cancer Sister 10       maternal half sister  . Breast cancer Maternal Aunt 62  . Colon cancer Neg Hx     Review of Systems:  Constitutional  Feels well,    ENT Normal appearing ears and nares bilaterally Skin/Breast  No rash, sores, jaundice, itching, dryness Cardiovascular  No chest pain, shortness of breath, or edema  Pulmonary  No cough or wheeze.  Gastro Intestinal  No nausea, vomitting, or diarrhoea. No bright red blood per rectum, no abdominal pain, change in bowel movement, or constipation.  Genito Urinary  No frequency, urgency, dysuria, no abnormal bleeding Musculo Skeletal  No myalgia, arthralgia, joint swelling or pain  Neurologic  No weakness, numbness, change in gait,  Psychology  No depression, anxiety, insomnia.   Vitals:  Height '5\' 6"'  (1.676 m), weight 167 lb (75.8 kg).  Physical  Exam: WD in NAD Neck  Supple NROM, without any enlargements.  Lymph Node Survey No cervical supraclavicular or inguinal adenopathy Cardiovascular  Pulse normal rate, regularity and rhythm. S1 and S2 normal.  Lungs  Clear to auscultation bilateraly, without wheezes/crackles/rhonchi. Good air movement.  Skin  No rash/lesions/breakdown  Psychiatry  Alert and oriented to person, place, and time  Abdomen  Normoactive bowel sounds, abdomen soft, non-tender and non without evidence of hernia.  Back No CVA tenderness Genito Urinary  Vulva/vagina: Normal external female genitalia.  No lesions. No discharge or bleeding.  Bladder/urethra:  No lesions or masses, well supported bladder  Vagina: normal  Cervix: Normal appearing, no lesions.  Uterus:  Small, mobile, no parametrial involvement or nodularity.  Adnexa: no palpable masses. Rectal  Good tone, no masses no cul de sac nodularity.  Extremities  No bilateral cyanosis, clubbing or edema.   Thereasa Solo, MD  12/29/2018, 6:37 AM

## 2018-12-29 NOTE — Op Note (Signed)
OPERATIVE NOTE 12/29/18  Surgeon: Donaciano Eva   Assistants: Dr Lahoma Crocker (an MD assistant was necessary for tissue manipulation, management of robotic instrumentation, retraction and positioning due to the complexity of the case and hospital policies).   Anesthesia: General endotracheal anesthesia  ASA Class: 3   Pre-operative Diagnosis: stage IV ovarian cancer, s/p neoadjuvant chemotherapy  Post-operative Diagnosis: same  Operation: Robotic-assisted laparoscopic total hysterectomy with bilateral salpingoophorectomy, omentectomy, radical tumor debulking   Surgeon: Donaciano Eva  Assistant Surgeon: Lahoma Crocker MD  Anesthesia: GET  Urine Output: 150cc  Operative Findings:  : bilateral 4cm ovarian cystic masses densely adherent to the sigmoid colon posteriorally and to the ovarian fossa peritoneum. Omentum with some nodularity in the infracolic omentum, but normal gastrocolic omentum.  Liver adherent to diaphragm consistent with tumor scaring.  No gross residual tumor at the completion of the procedure.   Estimated Blood Loss:  20cc      Total IV Fluids: 1,000 ml         Specimens: uterus with bilateral tubes and ovaries and omentum         Complications:  None; patient tolerated the procedure well.         Disposition: PACU - hemodynamically stable.  Procedure Details  The patient was seen in the Holding Room. The risks, benefits, complications, treatment options, and expected outcomes were discussed with the patient.  The patient concurred with the proposed plan, giving informed consent.  The site of surgery properly noted/marked. The patient was identified as Michelle Holt and the procedure verified as a Robotic-assisted hysterectomy with bilateral salpingo oophorectomy. A Time Out was held and the above information confirmed.  After induction of anesthesia, the patient was draped and prepped in the usual sterile manner. Pt was placed in  supine position after anesthesia and draped and prepped in the usual sterile manner. The abdominal drape was placed after the CholoraPrep had been allowed to dry for 3 minutes.  Her arms were tucked to her side with all appropriate precautions.  The shoulders were stabilized with padded shoulder blocks applied to the acromium processes.  The patient was placed in the semi-lithotomy position in [place].  The perineum was prepped with Betadine. The patient was then prepped. Foley catheter was placed.  A sterile speculum was placed in the vagina.  The cervix was grasped with a single-tooth tenaculum and dilated with Kennon Rounds dilators.  The ZUMI uterine manipulator with a medium colpotomizer ring was placed without difficulty.  A pneum occluder balloon was placed over the manipulator.  OG tube placement was confirmed and to suction.   Next, a 5 mm skin incision was made 5cm below the right subcostal margin in the midclavicular line. The right side was chosen for entry due to her history of splenic rupture and hematoma.  After entry and insufflation, a 59mm port was placed in the left upper quadrant under direct visualization in an atraumatic fashion. The 5 mm Optiview port and scope was used for direct entry.  Opening pressure was under 10 mm CO2.  The abdomen was insufflated and the findings were noted as above.   At this point and all points during the procedure, the patient's intra-abdominal pressure did not exceed 15 mmHg. Next, a 10 mm skin incision was made in the umbilicus and a right and left port was placed about 10 cm lateral to the robot port on the right and left side.  A fourth arm was placed in the left  lower quadrant 2 cm above and superior and medial to the anterior superior iliac spine.  All ports were placed under direct visualization.  The patient was placed in steep Trendelenburg.  Bowel was folded away into the upper abdomen.  The robot was docked in the normal manner.  The hysterectomy was  started after the round ligament on the right side was incised and the retroperitoneum was entered and the pararectal space was developed.  The ureter was noted to be on the medial leaf of the broad ligament.  The peritoneum above the ureter was incised and stretched and the infundibulopelvic ligament was skeletonized, cauterized and cut.  The posterior peritoneum was taken down to the level of the KOH ring.  The anterior peritoneum was also taken down.  The bladder flap was created to the level of the KOH ring.  The uterine artery on the right side was skeletonized, cauterized and cut in the normal manner.  A similar procedure was performed on the left.  The colpotomy was made and the uterus, cervix, bilateral ovaries and tubes were amputated and delivered through the vagina.  Pedicles were inspected and excellent hemostasis was achieved.    The robotic camera was rotated and the omentectomy was then performed. The omentum was delivered into the mid abdomen and elevated. The parietal peritonum that attaches the omentum to the transverse colon was incised facilitating separation of the mid portion of the omentum from the transverse colon. The right sided omentum with its vascular pedicles was then skeletonized in its attachments to the right transverse colon. These vascular pedicles were bipolar sealed and transected. Observation of the colonic wall occurred throughout. The dissection then moved progressively along the colon the the left splenic flexure of the transverse colon. The bipolar sealing forceps and the scissors were used to skeletonize vascular omental pedicles, seal them and transect them until the entire infracolic omentum had been freed from its transverse colonic attachments to the splenic flexure. The omentum was then placed in an endocatch bag and retrieved from the vagina.  The colpotomy at the vaginal cuff was closed with Vicryl on a CT1 needle in a running manner.  Irrigation was used and  excellent hemostasis was achieved.  At this point in the procedure was completed.  Robotic instruments were removed under direct visulaization.  The robot was undocked. The 10 mm ports were closed with Vicryl on a UR-5 needle and the fascia was closed with 0 Vicryl on a UR-5 needle.  The skin was closed with 4-0 Vicryl in a subcuticular manner.  Dermabond was applied.  Sponge, lap and needle counts correct x 2.  The patient was taken to the recovery room in stable condition.  The vagina was swabbed with  minimal bleeding noted.   All instrument and needle counts were correct x  3.   The patient was transferred to the recovery room in a stable condition.  Donaciano Eva, MD

## 2018-12-29 NOTE — Anesthesia Postprocedure Evaluation (Signed)
Anesthesia Post Note  Patient: RINNAH BEILSTEIN  Procedure(s) Performed: XI ROBOTIC ASSISTED TOTAL HYSTERECTOMY BILATERAL SALPINGO OOPHORECTOMY WITH OMENTECTOMY AND DEBULKING (N/A )     Patient location during evaluation: PACU Anesthesia Type: General Level of consciousness: awake and alert Pain management: pain level controlled Vital Signs Assessment: post-procedure vital signs reviewed and stable Respiratory status: spontaneous breathing, nonlabored ventilation, respiratory function stable and patient connected to nasal cannula oxygen Cardiovascular status: blood pressure returned to baseline and stable Postop Assessment: no apparent nausea or vomiting Anesthetic complications: no    Last Vitals:  Vitals:   12/29/18 1045 12/29/18 1100  BP: (!) 141/81   Pulse: 84 75  Resp: 18 11  Temp:  36.9 C  SpO2: 99% 100%    Last Pain:  Vitals:   12/29/18 1325  TempSrc:   PainSc: 6                  Afifa Truax P Shaquille Janes

## 2018-12-29 NOTE — Transfer of Care (Signed)
Immediate Anesthesia Transfer of Care Note  Patient: Michelle Holt  Procedure(s) Performed: Procedure(s): XI ROBOTIC ASSISTED TOTAL HYSTERECTOMY BILATERAL SALPINGO OOPHORECTOMY WITH OMENTECTOMY AND DEBULKING (N/A)  Patient Location: PACU  Anesthesia Type:General  Level of Consciousness: Alert, Awake, Oriented  Airway & Oxygen Therapy: Patient Spontanous Breathing  Post-op Assessment: Report given to RN  Post vital signs: Reviewed and stable  Last Vitals:  Vitals:   12/29/18 0605  BP: (!) 109/52  Pulse: 87  Resp: 16  Temp: 37 C  SpO2: 123XX123    Complications: No apparent anesthesia complications

## 2018-12-29 NOTE — Anesthesia Procedure Notes (Signed)
Procedure Name: Intubation Date/Time: 12/29/2018 7:49 AM Performed by: Gerald Leitz, CRNA Pre-anesthesia Checklist: Patient identified, Patient being monitored, Timeout performed, Emergency Drugs available and Suction available Patient Re-evaluated:Patient Re-evaluated prior to induction Oxygen Delivery Method: Circle system utilized Preoxygenation: Pre-oxygenation with 100% oxygen Induction Type: IV induction Ventilation: Mask ventilation without difficulty Laryngoscope Size: Mac and 3 Grade View: Grade I Tube type: Oral Tube size: 7.0 mm Number of attempts: 1 Placement Confirmation: ETT inserted through vocal cords under direct vision,  positive ETCO2 and breath sounds checked- equal and bilateral Secured at: 21 cm Tube secured with: Tape Dental Injury: Teeth and Oropharynx as per pre-operative assessment

## 2018-12-29 NOTE — Discharge Instructions (Signed)
12/29/2018  Return to work: 4 weeks  Activity: 1. Be up and out of the bed during the day.  Take a nap if needed.  You may walk up steps but be careful and use the hand rail.  Stair climbing will tire you more than you think, you may need to stop part way and rest.   2. No lifting or straining for 4 weeks.  3. No driving for 1 weeks.  Do Not drive if you are taking narcotic pain medicine.  4. Shower daily.  Use soap and water on your incision and pat dry; don't rub.   5. No sexual activity and nothing in the vagina for 8 weeks.  Medications:  - Take ibuprofen and tylenol first line for pain control. Take these regularly (every 6 hours) to decrease the build up of pain.  - If necessary, for severe pain not relieved by ibuprofen, take percocet.  - While taking percocet you should take sennakot every night to reduce the likelihood of constipation. If this causes diarrhea, stop its use.  Diet: 1. Low sodium Heart Healthy Diet is recommended.  2. It is safe to use a laxative if you have difficulty moving your bowels.   Wound Care: 1. Keep clean and dry.  Shower daily.  Reasons to call the Doctor:   Fever - Oral temperature greater than 100.4 degrees Fahrenheit  Foul-smelling vaginal discharge  Difficulty urinating  Nausea and vomiting  Increased pain at the site of the incision that is unrelieved with pain medicine.  Difficulty breathing with or without chest pain  New calf pain especially if only on one side  Sudden, continuing increased vaginal bleeding with or without clots.   Follow-up: 1. See Everitt Amber in 3 weeks. You will then start chemotherapy soon after that visit.  Contacts: For questions or concerns you should contact:  Dr. Everitt Amber at [phone] After hours and on week-ends call [phone] and ask to speak to the physician on call for Gynecologic Oncology

## 2018-12-30 ENCOUNTER — Telehealth: Payer: Self-pay | Admitting: Oncology

## 2018-12-30 ENCOUNTER — Telehealth: Payer: Self-pay

## 2018-12-30 LAB — SURGICAL PATHOLOGY

## 2018-12-30 NOTE — Telephone Encounter (Signed)
Michelle Holt called and said she was not able to pick up the oxycodone 5 mg prescription yesterday because the pharmacist at [facility] needs clarification that she can take the 5 mg oxycodone with the 10 mg oxycodone.  Called [facility] and spoke to the pharmacist.  Notified him that patient has the 10 mg oxycodone for chronic pain and was given the 5 mg oxycodone to take with the 10 mg for post op pain per Michelle John, NP.   Called Michelle Holt and notified her that she should be able to pick up the prescription now.

## 2018-12-30 NOTE — Telephone Encounter (Signed)
I spoke with MS Hauer this am.  She is doing well.  She is voiding without difficulty.   She is eating with no problems. She is passing gas but has not moved her bowels yet.  She is taking Senokot-s.  She c/o abd cramping.  I recommend using a heating pad and alternate tylenol with the oxycodone. She states she is unable to take ibuprofen.  She is afebrile and there is no drainage from the incisions.  I told her to call if she has any questions or concerns.  She verbalized understanding.

## 2019-01-01 ENCOUNTER — Other Ambulatory Visit (HOSPITAL_COMMUNITY): Payer: Self-pay | Admitting: Hematology

## 2019-01-04 ENCOUNTER — Encounter (HOSPITAL_COMMUNITY): Payer: Self-pay | Admitting: *Deleted

## 2019-01-04 NOTE — Progress Notes (Signed)
Patient called reporting feeling dizzy, loopy, not answering questions appropriately and some nausea with taking the gabapentin.  She is requesting to decrease the dose.  Per Reynolds Bowl, NP it is okay for patient to try taking just one 300 mg gabapentin at bedtime.  She was advised to call back to the clinic and let us know if it does not help her.  She verbalizes understanding.

## 2019-01-11 ENCOUNTER — Other Ambulatory Visit (HOSPITAL_COMMUNITY): Payer: Self-pay | Admitting: *Deleted

## 2019-01-11 DIAGNOSIS — C563 Malignant neoplasm of bilateral ovaries: Secondary | ICD-10-CM

## 2019-01-11 DIAGNOSIS — C561 Malignant neoplasm of right ovary: Secondary | ICD-10-CM

## 2019-01-11 MED ORDER — OXYCODONE HCL 10 MG PO TABS: 10.0000 mg | ORAL_TABLET | ORAL | 0 refills | Status: DC | PRN

## 2019-01-17 ENCOUNTER — Encounter (HOSPITAL_COMMUNITY): Payer: Self-pay | Admitting: Hematology

## 2019-01-17 NOTE — Assessment & Plan Note (Addendum)
1.  Clinical stage IVa ovarian cancer, positive cytology of left pleural effusion: -4 cycles of carboplatin and paclitaxel from 08/24/2018 through 12/01/2018. -CT CAP on 10/13/2018 showed decrease in size of bilateral ovarian masses. -We reviewed results of CT scan of the abdomen and pelvis from 12/21/2018 which showed continuous decrease with no new areas. -Germline mutation testing is negative. -We have reviewed labs today.  She will proceed with debulking surgery with Dr. Denman George on 12/29/2018. -We will see her back after surgery to discuss pathology and further plan.  2.  Left lateral chest wall and abdominal pain: -She is taking oxycodone 10 mg, 4 to 5 tablets daily.  3.  Splenic hematoma: -Ligation of splenic artery on 08/01/2018.  Subsequent scan showed stable hematoma.  Hemoglobin has been stable.  4.  Peripheral neuropathy: -She reported numbness in the feet which are hurting in the last few weeks.  Her shins also ache very badly. -We will start her on gabapentin 300 mg 3 times a day.

## 2019-01-18 ENCOUNTER — Inpatient Hospital Stay (HOSPITAL_BASED_OUTPATIENT_CLINIC_OR_DEPARTMENT_OTHER): Payer: Medicaid Other | Admitting: Hematology

## 2019-01-18 ENCOUNTER — Inpatient Hospital Stay (HOSPITAL_COMMUNITY): Payer: Medicaid Other | Attending: Hematology

## 2019-01-18 ENCOUNTER — Encounter (HOSPITAL_COMMUNITY): Payer: Self-pay | Admitting: Hematology

## 2019-01-18 ENCOUNTER — Other Ambulatory Visit: Payer: Self-pay

## 2019-01-18 VITALS — BP 99/48 | HR 81 | Temp 97.7°F | Resp 18 | Wt 165.9 lb

## 2019-01-18 DIAGNOSIS — Z803 Family history of malignant neoplasm of breast: Secondary | ICD-10-CM | POA: Insufficient documentation

## 2019-01-18 DIAGNOSIS — Z8349 Family history of other endocrine, nutritional and metabolic diseases: Secondary | ICD-10-CM | POA: Insufficient documentation

## 2019-01-18 DIAGNOSIS — C561 Malignant neoplasm of right ovary: Secondary | ICD-10-CM | POA: Insufficient documentation

## 2019-01-18 DIAGNOSIS — R0781 Pleurodynia: Secondary | ICD-10-CM | POA: Diagnosis not present

## 2019-01-18 DIAGNOSIS — Z8249 Family history of ischemic heart disease and other diseases of the circulatory system: Secondary | ICD-10-CM | POA: Diagnosis not present

## 2019-01-18 DIAGNOSIS — Z79899 Other long term (current) drug therapy: Secondary | ICD-10-CM | POA: Insufficient documentation

## 2019-01-18 DIAGNOSIS — R410 Disorientation, unspecified: Secondary | ICD-10-CM | POA: Diagnosis not present

## 2019-01-18 DIAGNOSIS — E785 Hyperlipidemia, unspecified: Secondary | ICD-10-CM | POA: Insufficient documentation

## 2019-01-18 DIAGNOSIS — Z8262 Family history of osteoporosis: Secondary | ICD-10-CM | POA: Diagnosis not present

## 2019-01-18 DIAGNOSIS — Z9079 Acquired absence of other genital organ(s): Secondary | ICD-10-CM | POA: Diagnosis not present

## 2019-01-18 DIAGNOSIS — C562 Malignant neoplasm of left ovary: Secondary | ICD-10-CM

## 2019-01-18 DIAGNOSIS — G629 Polyneuropathy, unspecified: Secondary | ICD-10-CM | POA: Insufficient documentation

## 2019-01-18 DIAGNOSIS — Z8049 Family history of malignant neoplasm of other genital organs: Secondary | ICD-10-CM | POA: Diagnosis not present

## 2019-01-18 DIAGNOSIS — Z833 Family history of diabetes mellitus: Secondary | ICD-10-CM | POA: Diagnosis not present

## 2019-01-18 DIAGNOSIS — D735 Infarction of spleen: Secondary | ICD-10-CM | POA: Insufficient documentation

## 2019-01-18 DIAGNOSIS — F419 Anxiety disorder, unspecified: Secondary | ICD-10-CM | POA: Diagnosis not present

## 2019-01-18 DIAGNOSIS — Z90722 Acquired absence of ovaries, bilateral: Secondary | ICD-10-CM | POA: Insufficient documentation

## 2019-01-18 DIAGNOSIS — Z5111 Encounter for antineoplastic chemotherapy: Secondary | ICD-10-CM | POA: Diagnosis not present

## 2019-01-18 DIAGNOSIS — Z7952 Long term (current) use of systemic steroids: Secondary | ICD-10-CM | POA: Diagnosis not present

## 2019-01-18 DIAGNOSIS — J910 Malignant pleural effusion: Secondary | ICD-10-CM | POA: Insufficient documentation

## 2019-01-18 DIAGNOSIS — Z87891 Personal history of nicotine dependence: Secondary | ICD-10-CM | POA: Diagnosis not present

## 2019-01-18 DIAGNOSIS — C563 Malignant neoplasm of bilateral ovaries: Secondary | ICD-10-CM

## 2019-01-18 DIAGNOSIS — Z90710 Acquired absence of both cervix and uterus: Secondary | ICD-10-CM | POA: Diagnosis not present

## 2019-01-18 LAB — COMPREHENSIVE METABOLIC PANEL
ALT: 25 U/L (ref 0–44)
AST: 25 U/L (ref 15–41)
Albumin: 4 g/dL (ref 3.5–5.0)
Alkaline Phosphatase: 98 U/L (ref 38–126)
Anion gap: 10 (ref 5–15)
BUN: 15 mg/dL (ref 6–20)
CO2: 26 mmol/L (ref 22–32)
Calcium: 9.8 mg/dL (ref 8.9–10.3)
Chloride: 101 mmol/L (ref 98–111)
Creatinine, Ser: 0.64 mg/dL (ref 0.44–1.00)
GFR calc Af Amer: >60 mL/min (ref >60)
GFR calc non Af Amer: >60 mL/min (ref >60)
Glucose, Bld: 99 mg/dL (ref 70–99)
Potassium: 4 mmol/L (ref 3.5–5.1)
Sodium: 137 mmol/L (ref 135–145)
Total Bilirubin: 0.7 mg/dL (ref 0.3–1.2)
Total Protein: 6.9 g/dL (ref 6.5–8.1)

## 2019-01-18 LAB — CBC WITH DIFFERENTIAL/PLATELET
Abs Immature Granulocytes: 0.01 10*3/uL (ref 0.00–0.07)
Basophils Absolute: 0 10*3/uL (ref 0.0–0.1)
Basophils Relative: 1 %
Eosinophils Absolute: 0.5 10*3/uL (ref 0.0–0.5)
Eosinophils Relative: 6 %
HCT: 40.6 % (ref 36.0–46.0)
Hemoglobin: 13.6 g/dL (ref 12.0–15.0)
Immature Granulocytes: 0 %
Lymphocytes Relative: 30 %
Lymphs Abs: 2.5 10*3/uL (ref 0.7–4.0)
MCH: 37.9 pg — ABNORMAL HIGH (ref 26.0–34.0)
MCHC: 33.5 g/dL (ref 30.0–36.0)
MCV: 113.1 fL — ABNORMAL HIGH (ref 80.0–100.0)
Monocytes Absolute: 0.7 10*3/uL (ref 0.1–1.0)
Monocytes Relative: 9 %
Neutro Abs: 4.4 10*3/uL (ref 1.7–7.7)
Neutrophils Relative %: 54 %
Platelets: 373 10*3/uL (ref 150–400)
RBC: 3.59 MIL/uL — ABNORMAL LOW (ref 3.87–5.11)
RDW: 11.9 % (ref 11.5–15.5)
WBC: 8.2 10*3/uL (ref 4.0–10.5)
nRBC: 0 % (ref 0.0–0.2)

## 2019-01-18 NOTE — Patient Instructions (Addendum)
[place] at [facility] Discharge Instructions  You were seen today by Dr. Delton Coombes. He went over your recent lab results. He is going to get you scheduled for a CT. He will see you back in 1 week for labs, treatment and follow up.   Thank you for choosing [place] at [facility] to provide your oncology and hematology care.  To afford each patient quality time with our provider, please arrive at least 15 minutes before your scheduled appointment time.   If you have a lab appointment with the [facility] please come in thru the  Main Entrance and check in at the main information desk  You need to re-schedule your appointment should you arrive 10 or more minutes late.  We strive to give you quality time with our providers, and arriving late affects you and other patients whose appointments are after yours.  Also, if you no show three or more times for appointments you may be dismissed from the clinic at the providers discretion.     Again, thank you for choosing [facility].  Our hope is that these requests will decrease the amount of time that you wait before being seen by our physicians.       _____________________________________________________________  Should you have questions after your visit to [facility], please contact our office at [phone] between the hours of 8:00 a.m. and 4:30 p.m.  Voicemails left after 4:00 p.m. will not be returned until the following business day.  For prescription refill requests, have your pharmacy contact our office and allow 72 hours.    Cancer Center Support Programs:   > Cancer Support Group  2nd Tuesday of the month 1pm-2pm, Journey Room

## 2019-01-18 NOTE — Progress Notes (Signed)
[name], [place] [address]   CLINIC:  Medical Oncology/Hematology  PCP:  [name], MD [place] [address] [phone]   REASON FOR VISIT:  Follow-up for ovarian cancer, splenic hematoma.   BRIEF ONCOLOGIC HISTORY:  Oncology History  Ovarian cancer, bilateral ([place])  07/07/2018 Pathology Results   PLEURAL FLUID, LEFT (SPECIMEN 1 OF 1, COLLECTED 07/07/18): - MALIGNANT CELLS CONSISTENT WITH ADENOCARCINOMA - SEE COMMENT  Source Pleural Fluid, (Specimen 1 of 1, collected on 07/07/2018) Gross Specimen: Received is/are 1000cc of bloody red fluid with tissue. (TC:tc) Prepared: # Smears: 0 # Concentration Technique Slides (i.e. ThinPrep): 1 # Cell Block: 1 Conventional Additional Studies: Two Hematology slides labeled [id number] Comment The malignant cells are positive for cytokeratin 7, p53, WT-1, Pax-8, Moc31, ER (weak) and EMA but negative for cytokeratin 20, TTF-1, GATA-3, CDX-2 and D2-40. Overall, the phenotype is consistent with a gynecologic primary; clinical correlation recommended.   07/07/2018 Procedure   Successful ultrasound guided left thoracentesis yielding 2.0 L of pleural fluid   07/08/2018 Procedure   1. Technically successful placement of left 14 French pigtail chest drain, placed to Pleur-evac water-seal.   07/08/2018 Procedure   1. Technically successful five [place] double lumen power injectable PICC placement   07/09/2018 Imaging   Ct chest 1. There is a moderate, loculated left hydropneumothorax with a small air component and moderate fluid component. The largest loculated component is located posteriorly. There is a pigtail drainage catheter about the lateral pleural space. There is no obvious etiology, such as obvious mass or pleural disease.   2. There is a small right pleural effusion with associated atelectasis or consolidation and a subpleural consolidation of the superior segment right lower lobe (series  4, image 56), of uncertain significance, possibly infectious or inflammatory   07/10/2018 Imaging   Ct abdomen and pelvis: 1. The bilateral ovaries are enlarged by heterogeneous appearing cystic lesions, measuring 5.3 x 4.2 cm on the right (series 4, image 72) and 4.5 x 3.2 cm on the left (series 4, image 75). Consider dedicated pelvic ultrasound and/or pelvic MRI to further evaluate for solid components given high suspicion for GYN primary malignancy.   2. No other evidence of mass and no lymphadenopathy in the abdomen or pelvis.   3. Trace ascites. There is some suggestion of omental and peritoneal nodularity (e.g. Series 4, image 40), concerning for peritoneal metastatic disease.    4. Loculated left-sided pleural effusion with left-sided pleural drainage catheter in position. Small right pleural effusion   07/13/2018 Surgery   OPERATION: 1.  Left VATS (video-assisted thoracoscopic surgery) for drainage of loculated pleural effusion. 2.  Talc pleurodesis for malignant pleural effusion. 3.  Placement of PleurX catheter for management of malignant pleural effusion. 4.  Placement of On-Q analgesia catheter system.    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   07/13/2018 Pathology Results   Pleura, peel, Left Pleural - FIBRO-FIBRINOUS PLEURITIS - NEGATIVE FOR MALIGNANCY   07/18/2018 Initial Diagnosis   Ovarian cancer, bilateral ([place])   07/20/2018 Procedure   EGD impression: Normal proximal esophagus and mid esophagus. Mild distal esophageal rings; dilation not performed because of esophagitis. LA Grade C reflux esophagitis. Z-line regular, 30 cm from the incisors. 5 cm hiatal hernia. Non-bleeding gastric ulcer with no stigmata of bleeding. Gastritis.  Duodenal erosions without bleeding. Normal  second portion of the duodenum. No specimens collected.   08/19/2018 Genetic Testing   Negative genetic testing on the common hereditary cancer panel.  The Common Hereditary Gene Panel offered by [facility] includes sequencing and/or deletion duplication testing of the following 48 genes: APC, ATM, AXIN2, BARD1, BMPR1A, BRCA1, BRCA2, BRIP1, CDH1, CDK4, CDKN2A (p14ARF), CDKN2A (p16INK4a), CHEK2, CTNNA1, DICER1, EPCAM (Deletion/duplication testing only), GREM1 (promoter region deletion/duplication testing only), KIT, MEN1, MLH1, MSH2, MSH3, MSH6, MUTYH, NBN, NF1, NHTL1, PALB2, PDGFRA, PMS2, POLD1, POLE, PTEN, RAD50, RAD51C, RAD51D, RNF43, SDHB, SDHC, SDHD, SMAD4, SMARCA4. STK11, TP53, TSC1, TSC2, and VHL.  The following genes were evaluated for sequence changes only: SDHA and HOXB13 c.251G>A variant only. The report date is August 19, 2018.   08/24/2018 -  Chemotherapy   The patient had palonosetron (ALOXI) injection 0.25 mg, 0.25 mg, Intravenous,  Once, 4 of 6 cycles Administration: 0.25 mg (08/24/2018), 0.25 mg (09/14/2018), 0.25 mg (10/05/2018), 0.25 mg (12/01/2018) CARBOplatin (PARAPLATIN) 740 mg in sodium chloride 0.9 % 250 mL chemo infusion, 740 mg (100 % of original dose 742.8 mg), Intravenous,  Once, 4 of 6 cycles Dose modification:   (original dose 742.8 mg, Cycle 1),   (original dose 736.8 mg, Cycle 2),   (original dose 736.8 mg, Cycle 3),   (original dose 736.8 mg, Cycle 4) Administration: 740 mg (08/24/2018), 740 mg (09/14/2018), 740 mg (10/05/2018), 740 mg (12/01/2018) PACLitaxel (TAXOL) 324 mg in sodium chloride 0.9 % 500 mL chemo infusion (> '80mg'$ /m2), 175 mg/m2 = 324 mg, Intravenous,  Once, 4 of 6 cycles Administration: 324 mg (08/24/2018), 324 mg (09/14/2018), 324 mg (10/05/2018), 324 mg (12/01/2018) fosaprepitant (EMEND) 150 mg, dexamethasone (DECADRON) 12 mg in sodium chloride 0.9 % 145 mL IVPB, , Intravenous,  Once, 4 of 6 cycles Administration:  (08/24/2018),  (09/14/2018),  (10/05/2018),   (12/01/2018)  for chemotherapy treatment.       CANCER STAGING: Cancer Staging No matching staging information was found for the patient.   INTERVAL HISTORY:  Ms. Mcdade 49 y.o. female seen for follow-up of ovarian cancer.  She underwent radical debulking surgery on 12/29/2018.  Abdominal pain has improved.  She reportedly stopped Lovenox last Tuesday.  She is fairly mobile.  She reported pleuritic pain in the left lower chest posteriorly started 2 days ago.  She also reports pains in all her joints particularly in the lower extremities.  She is taking oxycodone 10 mg 5-6 times a day.  She reports his gabapentin 3 times a day made her loopy.  REVIEW OF SYSTEMS:  Review of Systems  Cardiovascular: Positive for chest pain.  Neurological: Positive for numbness.  All other systems reviewed and are negative.    PAST MEDICAL/SURGICAL HISTORY:  Past Medical History:  Diagnosis Date  . Anxiety and depression   . Arthritis of facet joints at multiple vertebral levels    L5-S1  . Constipation   . Dyslipidemia   . Family history of breast cancer   . Family history of uterine cancer   . History of kidney stones   . Insomnia   . Irritable bowel syndrome   . Migraine   . Muscle tension headache   . Neuropathy of finger   . Ovarian carcinoma ([place])   . Plantar fasciitis of right foot   . Port-A-Cath in place 08/20/2018   Past Surgical History:  Procedure Laterality Date  . CHOLECYSTECTOMY  2008  . COLONOSCOPY N/A 08/13/2013   Procedure: COLONOSCOPY;  Surgeon: Rogene Houston,  MD;  [facility];  Service: Endoscopy;  Laterality: N/A;  230-moved to 145 Ann to notify pt  . ESOPHAGOGASTRODUODENOSCOPY    . ESOPHAGOGASTRODUODENOSCOPY (EGD) WITH PROPOFOL N/A 07/20/2018   Procedure: ESOPHAGOGASTRODUODENOSCOPY (EGD) WITH PROPOFOL;  Surgeon: Rogene Houston, MD;  [facility];  Service: Endoscopy;  Laterality: N/A;  Possible esophageal dilation.  . IR ANGIOGRAM SELECTIVE  EACH ADDITIONAL VESSEL  08/01/2018  . IR ANGIOGRAM VISCERAL SELECTIVE  08/01/2018  . IR EMBO ART  VEN HEMORR LYMPH EXTRAV  INC GUIDE ROADMAPPING  08/01/2018  . IR IMAGING GUIDED PORT INSERTION  08/20/2018  . IR PERC PLEURAL DRAIN W/INDWELL CATH W/IMG GUIDE  07/08/2018  . IR THORACENTESIS ASP PLEURAL SPACE W/IMG GUIDE  07/07/2018  . IR US GUIDE VASC ACCESS RIGHT  08/01/2018  . PLEURAL EFFUSION DRAINAGE Left 07/13/2018   Procedure: DRAINAGE OF LOCULATED PLEURAL EFFUSION;  Surgeon: Ivin Poot, MD;  Location: [facility];  Service: Thoracic;  Laterality: Left;  . REMOVAL OF PLEURAL DRAINAGE CATHETER Left 08/20/2018   Procedure: REMOVAL OF PLEURAL DRAINAGE CATHETER;  Surgeon: Ivin Poot, MD;  Location: [facility];  Service: Thoracic;  Laterality: Left;  . REMOVAL OF PLEURAL DRAINAGE CATHETER Left 08/20/2018   Procedure: REMOVAL OF PLEURAL DRAINAGE CATHETER;  Surgeon: Ivin Poot, MD;  Location: [facility];  Service: Thoracic;  Laterality: Left;  . TALC PLEURODESIS Left 07/13/2018   Procedure: Talc Pleuradesis;  Surgeon: Prescott Gum, Collier Salina, MD;  Location: [facility];  Service: Thoracic;  Laterality: Left;  . TUBAL LIGATION Bilateral   . UTERINE ABLATION    . VIDEO ASSISTED THORACOSCOPY Left 07/13/2018   Procedure: VIDEO ASSISTED THORACOSCOPY;  Surgeon: Ivin Poot, MD;  Location: [facility];  Service: Thoracic;  Laterality: Left;     SOCIAL HISTORY:  Social History   Socioeconomic History  . Marital status: Widowed    Spouse name: Not on file  . Number of children: 2  . Years of education: 2-College  . Highest education level: Not on file  Occupational History    Employer: [employer]  Tobacco Use  . Smoking status: Former Smoker    Packs/day: 0.50    Years: 17.00    Pack years: 8.50    Types: Cigarettes    Quit date: 06/22/2018    Years since quitting: 0.5  . Smokeless tobacco: Never Used  Substance and Sexual Activity  . Alcohol use: Yes    Alcohol/week: 1.0 standard drinks    Types: 1 Glasses of wine  per week    Comment: Drinks alcohol on occasion  . Drug use: No  . Sexual activity: Not on file  Other Topics Concern  . Not on file  Social History Narrative   Patient lives at home with her daughter.    Patient has 2 children.    Patient is widowed.    Patient is right handed.    Patient has her Associates degree.      Social Determinants of Health   Financial Resource Strain: Low Risk   . Difficulty of Paying Living Expenses: Not hard at all  Food Insecurity: No Food Insecurity  . Worried About Charity fundraiser in the Last Year: Never true  . Ran Out of Food in the Last Year: Never true  Transportation Needs: No Transportation Needs  . Lack of Transportation (Medical): No  . Lack of Transportation (Non-Medical): No  Physical Activity: Inactive  . Days of Exercise per Week: 0 days  . Minutes  of Exercise per Session: 0 min  Stress: Stress Concern Present  . Feeling of Stress : Very much  Social Connections: Somewhat Isolated  . Frequency of Communication with Friends and Family: More than three times a week  . Frequency of Social Gatherings with Friends and Family: More than three times a week  . Attends Religious Services: 1 to 4 times per year  . Active Member of Clubs or Organizations: No  . Attends Archivist Meetings: Never  . Marital Status: Widowed  Intimate Partner Violence: Not At Risk  . Fear of Current or Ex-Partner: No  . Emotionally Abused: No  . Physically Abused: No  . Sexually Abused: No    FAMILY HISTORY:  Family History  Problem Relation Age of Onset  . Hypertension Mother   . Obesity Mother   . Diabetes Mother   . Kidney disease Mother   . Peripheral vascular disease Father   . Atrial fibrillation Father   . COPD Brother   . Osteoporosis Brother   . Crohn's disease Sister   . Uterine cancer Sister 14       maternal half sister  . Breast cancer Maternal Aunt 83  . Colon cancer Neg Hx     CURRENT MEDICATIONS:  Outpatient  Encounter Medications as of 01/18/2019  Medication Sig Note  . Ascorbic Acid (VITAMIN C) 1000 MG tablet Take 1,000 mg by mouth every other day.   . bisacodyl (DULCOLAX) 5 MG EC tablet Take 5 mg by mouth 2 (two) times daily.   Marland Kitchen CARBOPLATIN IV Inject into the vein every 21 ( twenty-one) days.   . Cholecalciferol (VITAMIN D) 50 MCG (2000 UT) CAPS Take 2,000 Units by mouth daily.   . diazepam (VALIUM) 5 MG tablet Take 1 tablet (5 mg total) by mouth at bedtime.   . gabapentin (NEURONTIN) 300 MG capsule Take 300 mg by mouth every evening.   . magnesium oxide (MAG-OX) 400 MG tablet Take 400 mg by mouth every other day.   . Oxycodone HCl 10 MG TABS Take 1 tablet (10 mg total) by mouth every 4 (four) hours as needed.   Marland Kitchen PACLITAXEL IV Inject into the vein every 21 ( twenty-one) days.   . pantoprazole (PROTONIX) 40 MG tablet TAKE ONE TABLET BY MOUTH TWICE DAILY. TAKE BEFORE A MEAL.   Marland Kitchen senna (SENOKOT) 8.6 MG TABS tablet Take 1 tablet (8.6 mg total) by mouth daily as needed for mild constipation or moderate constipation. (Patient taking differently: Take 1 tablet by mouth 2 (two) times daily. )   . [DISCONTINUED] gabapentin (NEURONTIN) 100 MG capsule Take 100 mg by mouth 3 (three) times daily.   Marland Kitchen acetaminophen (TYLENOL) 500 MG tablet Take 1,000 mg by mouth every 6 (six) hours as needed for moderate pain.   Marland Kitchen dexamethasone (DECADRON) 4 MG tablet TAKE ONE TABLET BY MOUTH DAILY. (Patient not taking: No sig reported)   . dicyclomine (BENTYL) 10 MG capsule Take 1 capsule (10 mg total) by mouth 4 (four) times daily -  before meals and at bedtime. (Patient not taking: Reported on 12/23/2018) 12/22/2018: Typically during chemo  . lidocaine-prilocaine (EMLA) cream Apply small amount to port a cath site and cover with plastic wrap one hour prior to chemotherapy appointments (Patient not taking: Reported on 12/23/2018)   . ondansetron (ZOFRAN-ODT) 8 MG disintegrating tablet TAKE ONE TABLET BY MOUTH EVERY 8 HOURS AS NEEDED  FOR NAUSEA AND VOMITING. (Patient not taking: No sig reported)   . polyethylene glycol (  MIRALAX / GLYCOLAX) 17 g packet Take 17 g by mouth daily as needed for moderate constipation.    . SUMAtriptan (IMITREX) 100 MG tablet TAKE ONE TABLET BY MOUTH PRN UP to TWICE DAILY AS NEEDED. (Patient not taking: Reported on 01/18/2019)   . [DISCONTINUED] enoxaparin (LOVENOX) 40 MG/0.4ML injection Inject 0.4 mLs (40 mg total) into the skin daily for 14 doses. For AFTER SURGERY only (Patient not taking: Reported on 12/23/2018) 12/22/2018: For use after procedure  . [DISCONTINUED] gabapentin (NEURONTIN) 300 MG capsule Take 1 capsule (300 mg total) by mouth 3 (three) times daily. (Patient not taking: Reported on 12/24/2018) 12/29/2018: Stopped in August  . [DISCONTINUED] oxyCODONE (OXY IR/ROXICODONE) 5 MG immediate release tablet Take 1 tablet (5 mg total) by mouth every 4 (four) hours as needed for severe pain.    No facility-administered encounter medications on file as of 01/18/2019.    ALLERGIES:  Allergies  Allergen Reactions  . Morphine And Related Itching  . Nortriptyline Other (See Comments)    Significant weight gain  . Topamax [Topiramate] Diarrhea and Nausea Only  . Actifed Cold-Allergy [Chlorpheniramine-Phenyleph Er] Rash  . Amoxicillin Rash    Did it involve swelling of the face/tongue/throat, SOB, or low BP? Unknown Did it involve sudden or severe rash/hives, skin peeling, or any reaction on the inside of your mouth or nose? Unknown Did you need to seek medical attention at a hospital or doctor's office? Unknown When did it last happen? teenager If all above answers are "NO", may proceed with cephalosporin use.   . Codeine Hives  . Erythromycin Rash  . Penicillins Rash    Did it involve swelling of the face/tongue/throat, SOB, or low BP? Unknown Did it involve sudden or severe rash/hives, skin peeling, or any reaction on the inside of your mouth or nose? Unknown Did you need to seek  medical attention at a hospital or doctor's office? Unknown When did it last happen? teenager If all above answers are "NO", may proceed with cephalosporin use.   [name] [Pseudoephedrine Hcl] Rash     PHYSICAL EXAM:  ECOG Performance status: 1  Vitals:   01/18/19 1147  BP: (!) 99/48  Pulse: 81  Resp: 18  Temp: 97.7 F (36.5 C)  SpO2: 99%   Filed Weights   01/18/19 1147  Weight: 165 lb 14.4 oz (75.3 kg)    Physical Exam Vitals reviewed.  Constitutional:      Appearance: Normal appearance.  Cardiovascular:     Rate and Rhythm: Normal rate and regular rhythm.     Heart sounds: Normal heart sounds.  Pulmonary:     Effort: Pulmonary effort is normal.     Breath sounds: Normal breath sounds.  Abdominal:     General: There is no distension.     Palpations: Abdomen is soft. There is no mass.  Skin:    General: Skin is warm.  Neurological:     General: No focal deficit present.     Mental Status: She is alert and oriented to person, place, and time.  Psychiatric:        Mood and Affect: Mood normal.        Behavior: Behavior normal.    Left posterior chest wall tenderness present.  LABORATORY DATA:  I have reviewed the labs as listed.  CBC    Component Value Date/Time   WBC 8.2 01/18/2019 1046   RBC 3.59 (L) 01/18/2019 1046   HGB 13.6 01/18/2019 1046   HCT 40.6 01/18/2019 1046  PLT 373 01/18/2019 1046   MCV 113.1 (H) 01/18/2019 1046   MCH 37.9 (H) 01/18/2019 1046   MCHC 33.5 01/18/2019 1046   RDW 11.9 01/18/2019 1046   LYMPHSABS 2.5 01/18/2019 1046   MONOABS 0.7 01/18/2019 1046   EOSABS 0.5 01/18/2019 1046   BASOSABS 0.0 01/18/2019 1046   CMP Latest Ref Rng & Units 01/18/2019 12/23/2018 12/01/2018  Glucose 70 - 99 mg/dL 99 115(H) 104(H)  BUN 6 - 20 mg/dL '15 14 17  '$ Creatinine 0.44 - 1.00 mg/dL 0.64 0.60 0.58  Sodium 135 - 145 mmol/L 137 139 138  Potassium 3.5 - 5.1 mmol/L 4.0 4.4 3.5  Chloride 98 - 111 mmol/L 101 101 104  CO2 22 - 32 mmol/L '26  27 25  '$ Calcium 8.9 - 10.3 mg/dL 9.8 9.9 9.5  Total Protein 6.5 - 8.1 g/dL 6.9 7.0 6.7  Total Bilirubin 0.3 - 1.2 mg/dL 0.7 0.7 0.7  Alkaline Phos 38 - 126 U/L 98 152(H) 91  AST 15 - 41 U/L 25 52(H) 30  ALT 0 - 44 U/L 25 78(H) 26       DIAGNOSTIC IMAGING:  I have independently reviewed the scans and discussed with the patient.   I have reviewed Venita Lick LPN's note and agree with the documentation.  I personally performed a face-to-face visit, made revisions and my assessment and plan is as follows.    ASSESSMENT & PLAN:   Ovarian cancer, bilateral ([place]) 1.  Clinical stage IVa ovarian cancer, positive cytology of left pleural effusion: -4 cycles of carboplatin and paclitaxel from 08/24/2018 through 12/01/2018. -CT CAP on 10/13/2018 showed decrease in size of bilateral ovarian masses. -We reviewed results of CT scan of the abdomen and pelvis from 12/21/2018 which showed continuous decrease with no new areas. -Germline mutation testing is negative. -She underwent robotic assisted laparoscopic total abdominal hysterectomy and bilateral salpingo-oophorectomy and omentectomy on 12/29/2018. -We reviewed pathology which showed high-grade serous carcinoma, P T3a P NX. -She was recommended to have 3 more cycles of chemotherapy by Dr. Denman George. -She reported left posterior chest wall pain started 2 days ago.  Pain is worse on deep inspiration.  There is also tender area present with no palpable mass.  She used the Lovenox prophylaxis until a week ago. -I have recommended a CT chest PE protocol to be done stat.  We have reached out to her insurance but could not get immediate authorization.  I have recommended her to be seen in the ER.  However patient wanted to visit her son in [place].  She was instructed to go to the ER if the pain worsens. -We will tentatively start her chemotherapy next week.  I have also recommended foundation 1 testing for somatic mutations on her pathology.  2.  Left  lateral chest wall and abdominal pain: -She also reports pains in all joints particularly in the lower extremities. -She is taking oxycodone 10 mg 5 to 6 tablets daily on most days.  3.  Splenic hematoma: -Ligation of splenic artery on 08/01/2018.  Subsequent scan showed stable hematoma.  Hemoglobin has been stable.  4.  Peripheral neuropathy: -She has numbness in the feet with hurting.  I started her on gabapentin 300 mg 3 times a day at last visit. -She reported episodes of confusion.  We will cut back on gabapentin to 300 mg at bedtime.   Orders placed this encounter:  Orders Placed This Encounter  Procedures  . CT ANGIO CHEST PE W OR WO CONTRAST  Derek Jack, MD [place] [phone]  \

## 2019-01-18 NOTE — Assessment & Plan Note (Addendum)
1.  Clinical stage IVa ovarian cancer, positive cytology of left pleural effusion: -4 cycles of carboplatin and paclitaxel from 08/24/2018 through 12/01/2018. -CT CAP on 10/13/2018 showed decrease in size of bilateral ovarian masses. -We reviewed results of CT scan of the abdomen and pelvis from 12/21/2018 which showed continuous decrease with no new areas. -Germline mutation testing is negative. -She underwent robotic assisted laparoscopic total abdominal hysterectomy and bilateral salpingo-oophorectomy and omentectomy on 12/29/2018. -I reviewed pathology reports independently which showed high-grade serous carcinoma, P T3a P NX. -She was recommended to have 3 more cycles of chemotherapy by Dr. Denman George. -She reported left posterior chest wall pain started 2 days ago.  Pain is worse on deep inspiration.  There is also tender area present with no palpable mass.  She used the Lovenox prophylaxis until a week ago. -I have recommended a CT chest PE protocol to be done stat.  We have reached out to her insurance but could not get immediate authorization.  I have recommended her to be seen in the ER.  However patient wanted to visit her son in [place].  She was instructed to go to the ER if the pain worsens. -We will tentatively start her chemotherapy next week.  I have also recommended foundation 1 testing for somatic mutations on her pathology.  2.  Left lateral chest wall and abdominal pain: -She also reports pains in all joints particularly in the lower extremities. -She is taking oxycodone 10 mg 5 to 6 tablets daily on most days.  3.  Splenic hematoma: -Ligation of splenic artery on 08/01/2018.  Subsequent scan showed stable hematoma.  Hemoglobin has been stable.  4.  Peripheral neuropathy: -She has numbness in the feet with hurting.  I started her on gabapentin 300 mg 3 times a day at last visit. -She reported episodes of confusion.  We will cut back on gabapentin to 300 mg at bedtime.

## 2019-01-19 ENCOUNTER — Other Ambulatory Visit (HOSPITAL_COMMUNITY): Payer: Self-pay | Admitting: Nurse Practitioner

## 2019-01-20 ENCOUNTER — Telehealth: Payer: Self-pay | Admitting: Hematology

## 2019-01-20 ENCOUNTER — Encounter (HOSPITAL_COMMUNITY): Payer: Self-pay | Admitting: *Deleted

## 2019-01-20 ENCOUNTER — Ambulatory Visit (HOSPITAL_COMMUNITY): Payer: Medicaid Other

## 2019-01-20 NOTE — Progress Notes (Signed)
I emailed Michelle Holt and Michelle Holt in pathology and ordered foundation one on accession # [id number] Stage IVa Dx: C56.1, C56.2.

## 2019-01-21 ENCOUNTER — Encounter: Payer: Self-pay | Admitting: Gynecologic Oncology

## 2019-01-21 ENCOUNTER — Other Ambulatory Visit: Payer: Self-pay

## 2019-01-21 ENCOUNTER — Inpatient Hospital Stay: Payer: Medicaid Other | Attending: Gynecologic Oncology | Admitting: Gynecologic Oncology

## 2019-01-21 ENCOUNTER — Ambulatory Visit (HOSPITAL_COMMUNITY): Admission: RE | Admit: 2019-01-21 | Payer: Medicaid Other | Source: Ambulatory Visit

## 2019-01-21 VITALS — BP 105/74 | HR 93 | Temp 97.9°F | Resp 16 | Ht 66.0 in | Wt 163.8 lb

## 2019-01-21 DIAGNOSIS — F419 Anxiety disorder, unspecified: Secondary | ICD-10-CM | POA: Diagnosis not present

## 2019-01-21 DIAGNOSIS — C562 Malignant neoplasm of left ovary: Secondary | ICD-10-CM

## 2019-01-21 DIAGNOSIS — Z90722 Acquired absence of ovaries, bilateral: Secondary | ICD-10-CM | POA: Diagnosis not present

## 2019-01-21 DIAGNOSIS — C561 Malignant neoplasm of right ovary: Secondary | ICD-10-CM | POA: Diagnosis not present

## 2019-01-21 DIAGNOSIS — E7800 Pure hypercholesterolemia, unspecified: Secondary | ICD-10-CM | POA: Insufficient documentation

## 2019-01-21 DIAGNOSIS — Z90710 Acquired absence of both cervix and uterus: Secondary | ICD-10-CM | POA: Diagnosis not present

## 2019-01-21 DIAGNOSIS — Z9221 Personal history of antineoplastic chemotherapy: Secondary | ICD-10-CM | POA: Diagnosis not present

## 2019-01-21 DIAGNOSIS — C563 Malignant neoplasm of bilateral ovaries: Secondary | ICD-10-CM

## 2019-01-21 DIAGNOSIS — Z9049 Acquired absence of other specified parts of digestive tract: Secondary | ICD-10-CM | POA: Diagnosis not present

## 2019-01-21 DIAGNOSIS — F329 Major depressive disorder, single episode, unspecified: Secondary | ICD-10-CM | POA: Diagnosis not present

## 2019-01-21 DIAGNOSIS — Z7189 Other specified counseling: Secondary | ICD-10-CM

## 2019-01-21 DIAGNOSIS — Z79899 Other long term (current) drug therapy: Secondary | ICD-10-CM | POA: Insufficient documentation

## 2019-01-21 NOTE — Progress Notes (Signed)
Follow-up Note: Gyn-Onc  Consult was requested by Dr. Delton Coombes for the evaluation of Michelle Holt 49 y.o. female  CC:  Chief Complaint  Patient presents with  . Ovarian cancer, bilateral [facility])    Assessment/Plan:  [name]  is a 49 y.o.  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  BRCA negative (germline).   Status post 3 cycles of neoadjuvant carboplatin paclitaxel.  Significant treatment delays due to personal issues mid therapy. S/p optimal debulking via robotic approach on 12/29/18.  I am recommending that she resume chemotherapy with 3 cycles of carb/taxol. She will then be eligible for ongoing 3 monthly surveillance visits.  She has surgical menopause symptoms - for this I recommend either estrogen replacement transdermal therapy (increased risk for VTE) or venlafaxine treatment.   She will see me again after she is completed therapy  HPI: [name] is a 49 year old P2 who is seen in consultation at the request of Dr.Katragadda for evaluation of stage IV ovarian cancer.  The patient's history began in June 2020 when she experienced symptoms of shortness of breath.  She was seen and evaluated with a chest x-ray which showed bilateral pleural effusions and she underwent thoracentesis on July 07, 2018.  This revealed pleural fluid which was positive for adenocarcinoma favor GYN primary due to cytokeratin 7+ p53 positive WT 1+ PAX 8+ and weak ER positive.  CK20 was negative.  Post drainage she developed back pain.  A CT scan of the chest on July 09, 2018 which showed a moderate loculated left hydropneumothorax with a small air component and moderate fluid component.  The largest loculated component was located posteriorly.  There is a pigtail drainage catheter in the lateral space.  No obvious etiology was appreciated such as obvious mass or pleural disease.  There was also a small right pleural effusion with associated atelectasis.  It was of  uncertain significance.  Due to the epigastric pain that she was continuing to experience, she underwent a CT scan of the abdomen and pelvis on July 10, 2018.  This revealed bilateral ovaries enlarged by heterogeneous appearing cystic lesions measuring 5.3 x 4.2 cm on the right, and 4.5 x 3.2 cm on the left.  No other masses, lymphadenopathy was seen.  There was trace ascites.  There was some suggestion of omental and peritoneal nodularity concerning for peritoneal metastatic disease.  She underwent VATS procedure and left pleural peeling.  This showed fibrinous pleuritis negative for malignancy.  This was performed on July 13, 2018.  CA 125 on 07/19/18 was elevated at 581.   An EGD was performed on July 20, 2018.  This showed a nonbleeding gastric ulcer, duodenal erosions without bleeding, and a normal esophagus.  It was not felt to be the cause of a worsening anemia that was diagnosed at that time.  A PET on 07/31/18 showed hypermetabolic tissue associated with cystic masses of the LEFT and RIGHT ovary consistent with ovarian carcinoma. Evidence of carcinoma metastasis with rind of hypermetabolic tissue along the inferior margin of liver RIGHT hepatic lobe as well as on along the ventral LEFT peritoneal space and greater omentum. Hypermetabolic tissue in the LEFT pleural space is related to talc pleurodesis. Cannot exclude underlying malignancy. Incidental finding of a large hematoma in LEFT upper quadrant bound superiorly by the LEFT hemidiaphragm and inferiorly by the spleen.   She had persistent left upper quadrant and epigastric discomfort and was readmitted.  She was eventually diagnosed with  a splenic laceration thought to be secondary to her thoracentesis or VATS procedure.  She underwent splenic artery embolization in July 2020.  Since that time she has had worsening left upper quadrant pains.  Of note a thoracentesis had been placed in June on the left.  It is remained in place since that time  but stopped draining fluid in late July 2020.  She met with Dr.Katragadda who counseled her regarding the options of neoadjuvant chemotherapy followed by interval cytoreduction and additional chemotherapy.  The patient was initially cautious about undergoing aggressive chemotherapy as her husband died in 03-18-16 from advanced malignancy and she was concerned about quality of life diminution.  Her past medical history is scanty as she has had infrequent medical care.  She does report a history of hypercholesterolemia.  She had 2 prior vaginal births.  She denies uterine procedures other than an endometrial ablation for benign disease in 2003/03/19 and has been amenorrheic since that time.  She has had a cholecystectomy.  She had no other abdominal surgeries.  Her family history is significant for her being 1 of 12 children.  She has a maternal aunt who has a history of breast cancer, and a sister who was diagnosed with uterine cancer at age 55, she was overweight, it was a stage I disease.  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  She is not walking while receiving treatment for her cancer.  CA 125 on 08/24/18 prior to commencing treatment was [birth year].   She commenced neoadjuvant chemotherapy on 08/24/18. She received 3 cycles with cycle 3 on 10/05/18. She found chemotherapy very difficult with nausea and profound fatigue. Immediately following cycle 3, her son was involved in an ATV accident and suffered a severe brain injury and was in the ICU for 2 months. During that time the patient cancelled her planned interval debulking date and elected for a hold on chemotherapy so that she could be available to travel regularly to [place] to be with him.  CT scan on 10/13/18 following cycle 3 showed near complete interval resolution of a previously seen left pleural effusion, now trace, with residual pleural thickening. Interval decrease in size of complex cystic lesions of the  ovaries, a right-sided lesion  measuring 4.6 x 3.6 cm, previously 5.8 x 4.5 cm when measured similarly and a left-sided lesion measuring 4.6 x 3.8 cm,previously 4.7 x 4.7 cm when measured similarly.There is very faint residual peritoneal and omental thickening and stranding, which is improved when compared to baseline PET-CT examination dated 07/31/2018.  CA 125 on 11/06/18 was 144.   Genetic testing was negative for germline BRCA mutations.    Interval Hx:   On December 29, 2018 she underwent robotic assisted laparoscopic total hysterectomy with BSO omentectomy and radical tumor debulking for interval debulking for ovarian cancer. Intraoperative findings were significant for bilateral 4 cm ovarian cystic masses densely adherent to the sigmoid colon posteriorly into the ovarian fossa peritoneum.  Omentum with some nodularity in the infracolic omentum but normal gastrocolic omentum.  Liver adherent to the diaphragm consistent with tumor scarring.  No gross residual tumor at the completion of the procedure. Surgery was uncomplicated.  Final pathology revealed high-grade serous carcinoma of the ovaries (unable to determine site of origin) metastatic involvement in the omentum.  Stage IV based on prior clinical information from time of diagnosis.  Since surgery she has done well with no specific complaints other than the new onset of left thoracic pain that is somewhat improved  over 48-hour period.  She is waiting for a CT scan to better evaluate this.  She reports significant hot flash symptoms consistent with surgical menopause that are quality of life limiting as she cannot sleep.  Current Meds:  Outpatient Encounter Medications as of 01/21/2019  Medication Sig  . acetaminophen (TYLENOL) 500 MG tablet Take 1,000 mg by mouth every 6 (six) hours as needed for moderate pain.  . Ascorbic Acid (VITAMIN C) 1000 MG tablet Take 1,000 mg by mouth every other day.  . bisacodyl (DULCOLAX) 5 MG EC tablet Take 5 mg by mouth 2 (two) times  daily.  Marland Kitchen CARBOPLATIN IV Inject into the vein every 21 ( twenty-one) days.  . Cholecalciferol (VITAMIN D) 50 MCG (2000 UT) CAPS Take 2,000 Units by mouth daily.  Marland Kitchen dexamethasone (DECADRON) 4 MG tablet TAKE ONE TABLET BY MOUTH DAILY.  . diazepam (VALIUM) 5 MG tablet Take 1 tablet (5 mg total) by mouth at bedtime.  . dicyclomine (BENTYL) 10 MG capsule Take 1 capsule (10 mg total) by mouth 4 (four) times daily -  before meals and at bedtime.  . gabapentin (NEURONTIN) 300 MG capsule Take 300 mg by mouth every evening.  . lidocaine-prilocaine (EMLA) cream Apply small amount to port a cath site and cover with plastic wrap one hour prior to chemotherapy appointments  . magnesium oxide (MAG-OX) 400 MG tablet Take 400 mg by mouth every other day.  . ondansetron (ZOFRAN-ODT) 8 MG disintegrating tablet TAKE ONE TABLET BY MOUTH EVERY 8 HOURS AS NEEDED FOR NAUSEA AND VOMITING.  Marland Kitchen Oxycodone HCl 10 MG TABS Take 1 tablet (10 mg total) by mouth every 4 (four) hours as needed.  Marland Kitchen PACLITAXEL IV Inject into the vein every 21 ( twenty-one) days.  . pantoprazole (PROTONIX) 40 MG tablet TAKE ONE TABLET BY MOUTH TWICE DAILY. TAKE BEFORE A MEAL.  Marland Kitchen polyethylene glycol (MIRALAX / GLYCOLAX) 17 g packet Take 17 g by mouth daily as needed for moderate constipation.   . senna (SENOKOT) 8.6 MG TABS tablet Take 1 tablet (8.6 mg total) by mouth daily as needed for mild constipation or moderate constipation. (Patient taking differently: Take 1 tablet by mouth 2 (two) times daily. )  . SUMAtriptan (IMITREX) 100 MG tablet TAKE ONE TABLET BY MOUTH PRN UP to TWICE DAILY AS NEEDED.   No facility-administered encounter medications on file as of 01/21/2019.    Allergy:  Allergies  Allergen Reactions  . Morphine And Related Itching  . Nortriptyline Other (See Comments)    Significant weight gain  . Topamax [Topiramate] Diarrhea and Nausea Only  . Actifed Cold-Allergy [Chlorpheniramine-Phenyleph Er] Rash  . Amoxicillin Rash    Did  it involve swelling of the face/tongue/throat, SOB, or low BP? Unknown Did it involve sudden or severe rash/hives, skin peeling, or any reaction on the inside of your mouth or nose? Unknown Did you need to seek medical attention at a hospital or doctor's office? Unknown When did it last happen? teenager If all above answers are "NO", may proceed with cephalosporin use.   . Codeine Hives  . Erythromycin Rash  . Penicillins Rash    Did it involve swelling of the face/tongue/throat, SOB, or low BP? Unknown Did it involve sudden or severe rash/hives, skin peeling, or any reaction on the inside of your mouth or nose? Unknown Did you need to seek medical attention at a hospital or doctor's office? Unknown When did it last happen? teenager If all above answers are "NO", may proceed with  cephalosporin use.   [name] [Pseudoephedrine Hcl] Rash    Social Hx:   Social History   Socioeconomic History  . Marital status: Widowed    Spouse name: Not on file  . Number of children: 2  . Years of education: 2-College  . Highest education level: Not on file  Occupational History    Employer: [employer]  Tobacco Use  . Smoking status: Former Smoker    Packs/day: 0.50    Years: 17.00    Pack years: 8.50    Types: Cigarettes    Quit date: 06/22/2018    Years since quitting: 0.5  . Smokeless tobacco: Never Used  Substance and Sexual Activity  . Alcohol use: Yes    Alcohol/week: 1.0 standard drinks    Types: 1 Glasses of wine per week    Comment: Drinks alcohol on occasion  . Drug use: No  . Sexual activity: Not on file  Other Topics Concern  . Not on file  Social History Narrative   Patient lives at home with her daughter.    Patient has 2 children.    Patient is widowed.    Patient is right handed.    Patient has her Associates degree.      Social Determinants of Health   Financial Resource Strain: Low Risk   . Difficulty of Paying Living Expenses: Not hard at all  Food  Insecurity: No Food Insecurity  . Worried About Charity fundraiser in the Last Year: Never true  . Ran Out of Food in the Last Year: Never true  Transportation Needs: No Transportation Needs  . Lack of Transportation (Medical): No  . Lack of Transportation (Non-Medical): No  Physical Activity: Inactive  . Days of Exercise per Week: 0 days  . Minutes of Exercise per Session: 0 min  Stress: Stress Concern Present  . Feeling of Stress : Very much  Social Connections: Somewhat Isolated  . Frequency of Communication with Friends and Family: More than three times a week  . Frequency of Social Gatherings with Friends and Family: More than three times a week  . Attends Religious Services: 1 to 4 times per year  . Active Member of Clubs or Organizations: No  . Attends Archivist Meetings: Never  . Marital Status: Widowed  Intimate Partner Violence: Not At Risk  . Fear of Current or Ex-Partner: No  . Emotionally Abused: No  . Physically Abused: No  . Sexually Abused: No    Past Surgical Hx:  Past Surgical History:  Procedure Laterality Date  . CHOLECYSTECTOMY  2008  . COLONOSCOPY N/A 08/13/2013   Procedure: COLONOSCOPY;  Surgeon: Rogene Houston, MD;  [facility];  Service: Endoscopy;  Laterality: N/A;  230-moved to 145 Ann to notify pt  . ESOPHAGOGASTRODUODENOSCOPY    . ESOPHAGOGASTRODUODENOSCOPY (EGD) WITH PROPOFOL N/A 07/20/2018   Procedure: ESOPHAGOGASTRODUODENOSCOPY (EGD) WITH PROPOFOL;  Surgeon: Rogene Houston, MD;  [facility];  Service: Endoscopy;  Laterality: N/A;  Possible esophageal dilation.  . IR ANGIOGRAM SELECTIVE EACH ADDITIONAL VESSEL  08/01/2018  . IR ANGIOGRAM VISCERAL SELECTIVE  08/01/2018  . IR EMBO ART  VEN HEMORR LYMPH EXTRAV  INC GUIDE ROADMAPPING  08/01/2018  . IR IMAGING GUIDED PORT INSERTION  08/20/2018  . IR PERC PLEURAL DRAIN W/INDWELL CATH W/IMG GUIDE  07/08/2018  . IR THORACENTESIS ASP PLEURAL SPACE W/IMG GUIDE  07/07/2018  . IR  US GUIDE VASC ACCESS RIGHT  08/01/2018  . PLEURAL EFFUSION DRAINAGE  Left 07/13/2018   Procedure: DRAINAGE OF LOCULATED PLEURAL EFFUSION;  Surgeon: Ivin Poot, MD;  Location: [place];  Service: Thoracic;  Laterality: Left;  . REMOVAL OF PLEURAL DRAINAGE CATHETER Left 08/20/2018   Procedure: REMOVAL OF PLEURAL DRAINAGE CATHETER;  Surgeon: Ivin Poot, MD;  Location: [place];  Service: Thoracic;  Laterality: Left;  . REMOVAL OF PLEURAL DRAINAGE CATHETER Left 08/20/2018   Procedure: REMOVAL OF PLEURAL DRAINAGE CATHETER;  Surgeon: Ivin Poot, MD;  Location: [place];  Service: Thoracic;  Laterality: Left;  . TALC PLEURODESIS Left 07/13/2018   Procedure: Talc Pleuradesis;  Surgeon: Prescott Gum, Collier Salina, MD;  Location: [place];  Service: Thoracic;  Laterality: Left;  . TUBAL LIGATION Bilateral   . UTERINE ABLATION    . VIDEO ASSISTED THORACOSCOPY Left 07/13/2018   Procedure: VIDEO ASSISTED THORACOSCOPY;  Surgeon: Ivin Poot, MD;  Location: [place];  Service: Thoracic;  Laterality: Left;    Past Medical Hx:  Past Medical History:  Diagnosis Date  . Anxiety and depression   . Arthritis of facet joints at multiple vertebral levels    L5-S1  . Constipation   . Dyslipidemia   . Family history of breast cancer   . Family history of uterine cancer   . History of kidney stones   . Insomnia   . Irritable bowel syndrome   . Migraine   . Muscle tension headache   . Neuropathy of finger   . Ovarian carcinoma ([place])   . Plantar fasciitis of right foot   . Port-A-Cath in place 08/20/2018    Past Gynecological History:  See HPI No LMP recorded. Patient has had an ablation.  Family Hx:  Family History  Problem Relation Age of Onset  . Hypertension Mother   . Obesity Mother   . Diabetes Mother   . Kidney disease Mother   . Peripheral vascular disease Father   . Atrial fibrillation Father   . COPD Brother   . Osteoporosis Brother   . Crohn's disease Sister   . Uterine cancer Sister 40        maternal half sister  . Breast cancer Maternal Aunt 73  . Colon cancer Neg Hx     Review of Systems:  Constitutional  Feels well,    ENT Normal appearing ears and nares bilaterally Skin/Breast  No rash, sores, jaundice, itching, dryness Cardiovascular  No chest pain, shortness of breath, or edema  Pulmonary  No cough or wheeze.  Gastro Intestinal  No nausea, vomitting, or diarrhoea. No bright red blood per rectum, no abdominal pain, change in bowel movement, or constipation.  Genito Urinary  No frequency, urgency, dysuria, no abnormal bleeding Musculo Skeletal  No myalgia, arthralgia, joint swelling or pain  Neurologic  No weakness, numbness, change in gait,  Psychology  No depression, anxiety, insomnia.   Vitals:  Blood pressure 105/74, pulse 93, temperature 97.9 F (36.6 C), temperature source Temporal, resp. rate 16, height _0  (1.676 m), weight 163 lb 12.8 oz (74.3 kg), SpO2 100 %.  Physical Exam: WD in NAD Neck  Supple NROM, without any enlargements.  Lymph Node Survey No cervical supraclavicular or inguinal adenopathy Cardiovascular  Pulse normal rate, regularity and rhythm. S1 and S2 normal.  Lungs  Clear to auscultation bilateraly, without wheezes/crackles/rhonchi. Good air movement.  Skin  No rash/lesions/breakdown  Psychiatry  Alert and oriented to person, place, and time  Abdomen  Normoactive bowel sounds, abdomen soft, non-tender and non without evidence of hernia. Well  healed incisions.  Back No CVA tenderness Genito Urinary  Vaginal cuff: in tact. No masses or lesions Rectal  Good tone, no masses no cul de sac nodularity.  Extremities  No bilateral cyanosis, clubbing or edema.   30 minutes of direct face to face counseling time was spent with the patient. This included discussion about prognosis, therapy recommendations and postoperative side effects and are beyond the scope of routine postoperative care.   Thereasa Solo, MD  01/21/2019, 4:23  PM

## 2019-01-21 NOTE — Patient Instructions (Signed)
Dr Denman George recommends either estrogen or venlafaxine for your hot flashes. She will discuss with Dr Raliegh Ip.  Dr Denman George recommends resuming chemotherapy for 3 cycles.  After chemotherapy Dr Denman George will see you back every 3 months for checkups.

## 2019-01-22 ENCOUNTER — Ambulatory Visit (HOSPITAL_COMMUNITY): Payer: Medicaid Other

## 2019-01-26 ENCOUNTER — Ambulatory Visit (HOSPITAL_COMMUNITY): Payer: Medicaid Other

## 2019-01-27 ENCOUNTER — Inpatient Hospital Stay (HOSPITAL_COMMUNITY): Payer: Medicaid Other

## 2019-01-27 ENCOUNTER — Encounter (HOSPITAL_COMMUNITY): Payer: Self-pay | Admitting: Hematology

## 2019-01-27 ENCOUNTER — Other Ambulatory Visit: Payer: Self-pay

## 2019-01-27 ENCOUNTER — Inpatient Hospital Stay (HOSPITAL_BASED_OUTPATIENT_CLINIC_OR_DEPARTMENT_OTHER): Payer: Medicaid Other | Admitting: Hematology

## 2019-01-27 VITALS — BP 100/44 | HR 80 | Temp 97.0°F | Resp 18 | Wt 162.8 lb

## 2019-01-27 VITALS — BP 103/60 | HR 74 | Temp 97.1°F | Resp 18

## 2019-01-27 DIAGNOSIS — C562 Malignant neoplasm of left ovary: Secondary | ICD-10-CM

## 2019-01-27 DIAGNOSIS — C561 Malignant neoplasm of right ovary: Secondary | ICD-10-CM

## 2019-01-27 DIAGNOSIS — C563 Malignant neoplasm of bilateral ovaries: Secondary | ICD-10-CM

## 2019-01-27 DIAGNOSIS — Z95828 Presence of other vascular implants and grafts: Secondary | ICD-10-CM

## 2019-01-27 DIAGNOSIS — Z5111 Encounter for antineoplastic chemotherapy: Secondary | ICD-10-CM | POA: Diagnosis not present

## 2019-01-27 DIAGNOSIS — R232 Flushing: Secondary | ICD-10-CM | POA: Diagnosis not present

## 2019-01-27 LAB — CBC WITH DIFFERENTIAL/PLATELET
Abs Immature Granulocytes: 0.02 10*3/uL (ref 0.00–0.07)
Basophils Absolute: 0 10*3/uL (ref 0.0–0.1)
Basophils Relative: 1 %
Eosinophils Absolute: 0.5 10*3/uL (ref 0.0–0.5)
Eosinophils Relative: 7 %
HCT: 40.7 % (ref 36.0–46.0)
Hemoglobin: 13.5 g/dL (ref 12.0–15.0)
Immature Granulocytes: 0 %
Lymphocytes Relative: 28 %
Lymphs Abs: 2.1 10*3/uL (ref 0.7–4.0)
MCH: 37.2 pg — ABNORMAL HIGH (ref 26.0–34.0)
MCHC: 33.2 g/dL (ref 30.0–36.0)
MCV: 112.1 fL — ABNORMAL HIGH (ref 80.0–100.0)
Monocytes Absolute: 0.7 10*3/uL (ref 0.1–1.0)
Monocytes Relative: 9 %
Neutro Abs: 4.1 10*3/uL (ref 1.7–7.7)
Neutrophils Relative %: 55 %
Platelets: 340 10*3/uL (ref 150–400)
RBC: 3.63 MIL/uL — ABNORMAL LOW (ref 3.87–5.11)
RDW: 11.7 % (ref 11.5–15.5)
WBC: 7.4 10*3/uL (ref 4.0–10.5)
nRBC: 0 % (ref 0.0–0.2)

## 2019-01-27 LAB — COMPREHENSIVE METABOLIC PANEL
ALT: 25 U/L (ref 0–44)
AST: 27 U/L (ref 15–41)
Albumin: 3.7 g/dL (ref 3.5–5.0)
Alkaline Phosphatase: 89 U/L (ref 38–126)
Anion gap: 10 (ref 5–15)
BUN: 13 mg/dL (ref 6–20)
CO2: 27 mmol/L (ref 22–32)
Calcium: 9.7 mg/dL (ref 8.9–10.3)
Chloride: 103 mmol/L (ref 98–111)
Creatinine, Ser: 0.59 mg/dL (ref 0.44–1.00)
GFR calc Af Amer: >60 mL/min (ref >60)
GFR calc non Af Amer: >60 mL/min (ref >60)
Glucose, Bld: 105 mg/dL — ABNORMAL HIGH (ref 70–99)
Potassium: 3.7 mmol/L (ref 3.5–5.1)
Sodium: 140 mmol/L (ref 135–145)
Total Bilirubin: 0.7 mg/dL (ref 0.3–1.2)
Total Protein: 6.6 g/dL (ref 6.5–8.1)

## 2019-01-27 MED ORDER — HEPARIN SOD (PORK) LOCK FLUSH 100 UNIT/ML IV SOLN
500.0000 [IU] | Freq: Once | INTRAVENOUS | Status: AC | PRN
  Administered 2019-01-27: 500 [IU]

## 2019-01-27 MED ORDER — DIPHENHYDRAMINE HCL 50 MG/ML IJ SOLN
50.0000 mg | Freq: Once | INTRAMUSCULAR | Status: AC
  Administered 2019-01-27: 50 mg via INTRAVENOUS

## 2019-01-27 MED ORDER — FAMOTIDINE IN NACL 20-0.9 MG/50ML-% IV SOLN
20.0000 mg | Freq: Once | INTRAVENOUS | Status: AC
  Administered 2019-01-27: 20 mg via INTRAVENOUS

## 2019-01-27 MED ORDER — FAMOTIDINE IN NACL 20-0.9 MG/50ML-% IV SOLN
INTRAVENOUS | Status: AC
  Filled 2019-01-27: qty 50

## 2019-01-27 MED ORDER — SODIUM CHLORIDE 0.9 % IV SOLN: Freq: Once | INTRAVENOUS | Status: AC

## 2019-01-27 MED ORDER — VENLAFAXINE HCL ER 37.5 MG PO CP24: 37.5000 mg | ORAL_CAPSULE | Freq: Every day | ORAL | 0 refills | Status: DC

## 2019-01-27 MED ORDER — PALONOSETRON HCL INJECTION 0.25 MG/5ML
0.2500 mg | Freq: Once | INTRAVENOUS | Status: AC
  Administered 2019-01-27: 0.25 mg via INTRAVENOUS

## 2019-01-27 MED ORDER — SODIUM CHLORIDE 0.9 % IV SOLN
Freq: Once | INTRAVENOUS | Status: AC
  Filled 2019-01-27: qty 5

## 2019-01-27 MED ORDER — PALONOSETRON HCL INJECTION 0.25 MG/5ML
INTRAVENOUS | Status: AC
  Filled 2019-01-27: qty 5

## 2019-01-27 MED ORDER — DIPHENHYDRAMINE HCL 50 MG/ML IJ SOLN
INTRAMUSCULAR | Status: AC
  Filled 2019-01-27: qty 1

## 2019-01-27 MED ORDER — SODIUM CHLORIDE 0.9 % IV SOLN
175.0000 mg/m2 | Freq: Once | INTRAVENOUS | Status: AC
  Administered 2019-01-27: 324 mg via INTRAVENOUS
  Filled 2019-01-27: qty 54

## 2019-01-27 MED ORDER — SODIUM CHLORIDE 0.9 % IV SOLN
736.8000 mg | Freq: Once | INTRAVENOUS | Status: AC
  Administered 2019-01-27: 15:00:00 740 mg via INTRAVENOUS
  Filled 2019-01-27: qty 74

## 2019-01-27 MED ORDER — SODIUM CHLORIDE 0.9% FLUSH
10.0000 mL | INTRAVENOUS | Status: DC | PRN
  Administered 2019-01-27: 10 mL

## 2019-01-27 NOTE — Assessment & Plan Note (Addendum)
1.  Clinical stage IVa ovarian cancer, positive cytology of left pleural effusion: -4 cycles of carboplatin and paclitaxel from 08/24/2018 through 12/01/2018. -CT CAP on 10/13/2018 showed decrease in size of bilateral ovarian masses. -CTAP from 12/21/2018 independently reviewed by me showed continuous decrease with no new areas. -Germline mutation testing is negative. -Robotic assisted laparoscopic total abdominal hysterectomy and bilateral salpingo-oophorectomy and omentectomy on 12/29/2018, pathology showed high-grade serous carcinoma, [id number]. -She met with Dr. Denman George on 01/21/2019 and was recommended 3 more cycles of chemotherapy followed by surveillance visits every 3 months. -We have sent foundation 1 testing. -I have reviewed her labs today.  I have talked to her about chemotherapy and side effects. -She may proceed with her treatment today.  We will reevaluate her in 3 weeks.  2.  Left lateral chest wall pain: -She is taking oxycodone 10 mg 4 to 5 tablets daily which is helping.  3.  Hot flashes: -She reported hot flashes worsening day and night.  She is getting hot flashes every 1-2 hours. -We talked about starting her on venlafaxine at 37.5 mg for 1 week and titrate the dose to 75 mg daily.  We talked about side effects in detail.  I have sent a prescription.  4.  Peripheral neuropathy: -She will continue gabapentin 300 mg at bedtime.  If it does not help, will increase it to 600 mg at bedtime. -She tried 3 times a day which caused excessive drowsiness.

## 2019-01-27 NOTE — Progress Notes (Signed)
Michelle Holt, [place] [address]   CLINIC:  Medical Oncology/Hematology  PCP:  [name], MD [place] [address] [phone]   REASON FOR VISIT:  Follow-up for ovarian cancer, splenic hematoma.   BRIEF ONCOLOGIC HISTORY:  Oncology History  Ovarian cancer, bilateral ([place])  07/07/2018 Pathology Results   PLEURAL FLUID, LEFT (SPECIMEN 1 OF 1, COLLECTED 07/07/18): - MALIGNANT CELLS CONSISTENT WITH ADENOCARCINOMA - SEE COMMENT  Source Pleural Fluid, (Specimen 1 of 1, collected on 07/07/2018) Gross Specimen: Received is/are 1000cc of bloody red fluid with tissue. (TC:tc) Prepared: # Smears: 0 # Concentration Technique Slides (i.e. ThinPrep): 1 # Cell Block: 1 Conventional Additional Studies: Two Hematology slides labeled [id number] Comment The malignant cells are positive for cytokeratin 7, p53, WT-1, Pax-8, Moc31, ER (weak) and EMA but negative for cytokeratin 20, TTF-1, GATA-3, CDX-2 and D2-40. Overall, the phenotype is consistent with a gynecologic primary; clinical correlation recommended.   07/07/2018 Procedure   Successful ultrasound guided left thoracentesis yielding 2.0 L of pleural fluid   07/08/2018 Procedure   1. Technically successful placement of left 14 French pigtail chest drain, placed to Pleur-evac water-seal.   07/08/2018 Procedure   1. Technically successful five [place] double lumen power injectable PICC placement   07/09/2018 Imaging   Ct chest 1. There is a moderate, loculated left hydropneumothorax with a small air component and moderate fluid component. The largest loculated component is located posteriorly. There is a pigtail drainage catheter about the lateral pleural space. There is no obvious etiology, such as obvious mass or pleural disease.   2. There is a small right pleural effusion with associated atelectasis or consolidation and a subpleural consolidation of the superior segment right lower lobe (series  4, image 56), of uncertain significance, possibly infectious or inflammatory   07/10/2018 Imaging   Ct abdomen and pelvis: 1. The bilateral ovaries are enlarged by heterogeneous appearing cystic lesions, measuring 5.3 x 4.2 cm on the right (series 4, image 72) and 4.5 x 3.2 cm on the left (series 4, image 75). Consider dedicated pelvic ultrasound and/or pelvic MRI to further evaluate for solid components given high suspicion for GYN primary malignancy.   2. No other evidence of mass and no lymphadenopathy in the abdomen or pelvis.   3. Trace ascites. There is some suggestion of omental and peritoneal nodularity (e.g. Series 4, image 40), concerning for peritoneal metastatic disease.    4. Loculated left-sided pleural effusion with left-sided pleural drainage catheter in position. Small right pleural effusion   07/13/2018 Surgery   OPERATION: 1.  Left VATS (video-assisted thoracoscopic surgery) for drainage of loculated pleural effusion. 2.  Talc pleurodesis for malignant pleural effusion. 3.  Placement of PleurX catheter for management of malignant pleural effusion. 4.  Placement of On-Q analgesia catheter system.    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   07/13/2018 Pathology Results   Pleura, peel, Left Pleural - FIBRO-FIBRINOUS PLEURITIS - NEGATIVE FOR MALIGNANCY   07/18/2018 Initial Diagnosis   Ovarian cancer, bilateral ([place])   07/20/2018 Procedure   EGD impression: Normal proximal esophagus and mid esophagus. Mild distal esophageal rings; dilation not performed because of esophagitis. LA Grade C reflux esophagitis. Z-line regular, 30 cm from the incisors. 5 cm hiatal hernia. Non-bleeding gastric ulcer with no stigmata of bleeding. Gastritis.  Duodenal erosions without bleeding. Normal  second portion of the duodenum. No specimens collected.   08/19/2018 Genetic Testing   Negative genetic testing on the common hereditary cancer panel.  The Common Hereditary Gene Panel offered by [facility] includes sequencing and/or deletion duplication testing of the following 48 genes: APC, ATM, AXIN2, BARD1, BMPR1A, BRCA1, BRCA2, BRIP1, CDH1, CDK4, CDKN2A (p14ARF), CDKN2A (p16INK4a), CHEK2, CTNNA1, DICER1, EPCAM (Deletion/duplication testing only), GREM1 (promoter region deletion/duplication testing only), KIT, MEN1, MLH1, MSH2, MSH3, MSH6, MUTYH, NBN, NF1, NHTL1, PALB2, PDGFRA, PMS2, POLD1, POLE, PTEN, RAD50, RAD51C, RAD51D, RNF43, SDHB, SDHC, SDHD, SMAD4, SMARCA4. STK11, TP53, TSC1, TSC2, and VHL.  The following genes were evaluated for sequence changes only: SDHA and HOXB13 c.251G>A variant only. The report date is August 19, 2018.   08/24/2018 -  Chemotherapy   The patient had palonosetron (ALOXI) injection 0.25 mg, 0.25 mg, Intravenous,  Once, 4 of 6 cycles Administration: 0.25 mg (08/24/2018), 0.25 mg (09/14/2018), 0.25 mg (10/05/2018), 0.25 mg (12/01/2018) CARBOplatin (PARAPLATIN) 740 mg in sodium chloride 0.9 % 250 mL chemo infusion, 740 mg (100 % of original dose 742.8 mg), Intravenous,  Once, 4 of 6 cycles Dose modification:   (original dose 742.8 mg, Cycle 1),   (original dose 736.8 mg, Cycle 2),   (original dose 736.8 mg, Cycle 3),   (original dose 736.8 mg, Cycle 4) Administration: 740 mg (08/24/2018), 740 mg (09/14/2018), 740 mg (10/05/2018), 740 mg (12/01/2018) PACLitaxel (TAXOL) 324 mg in sodium chloride 0.9 % 500 mL chemo infusion (> 36m/m2), 175 mg/m2 = 324 mg, Intravenous,  Once, 4 of 6 cycles Administration: 324 mg (08/24/2018), 324 mg (09/14/2018), 324 mg (10/05/2018), 324 mg (12/01/2018) fosaprepitant (EMEND) 150 mg, dexamethasone (DECADRON) 12 mg in sodium chloride 0.9 % 145 mL IVPB, , Intravenous,  Once, 4 of 6 cycles Administration:  (08/24/2018),  (09/14/2018),  (10/05/2018),   (12/01/2018)  for chemotherapy treatment.       CANCER STAGING: Cancer Staging No matching staging information was found for the patient.   INTERVAL HISTORY:  [name]89y.o. female seen for follow-up and initiation of chemotherapy with carboplatin and paclitaxel.  Appetite is 100%.  Energy levels are 50%.  Reports pain all over the body 6 out of 10.  Pain is predominantly in the left lateral chest wall.  Numbness in the hands and feet has been stable.  Reports hot flashes day and night, every 1-2 hours.  REVIEW OF SYSTEMS:  Review of Systems  Cardiovascular: Positive for chest pain.  Neurological: Positive for numbness.  All other systems reviewed and are negative.    PAST MEDICAL/SURGICAL HISTORY:  Past Medical History:  Diagnosis Date  . Anxiety and depression   . Arthritis of facet joints at multiple vertebral levels    L5-S1  . Constipation   . Dyslipidemia   . Family history of breast cancer   . Family history of uterine cancer   . History of kidney stones   . Insomnia   . Irritable bowel syndrome   . Migraine   . Muscle tension headache   . Neuropathy of finger   . [place]varian carcinoma ([place]   . Plantar fasciitis of right foot   . Port-A-Cath in place 08/20/2018   Past Surgical History:  Procedure Laterality Date  . CHOLECYSTECTOMY  2008  . COLONOSCOPY N/A 08/13/2013   Procedure: COLONOSCOPY;  Surgeon: NRogene Houston MD;  [facility];  Service: Endoscopy;  Laterality: N/A;  230-moved to 145 Ann to notify pt  .  ESOPHAGOGASTRODUODENOSCOPY    . ESOPHAGOGASTRODUODENOSCOPY (EGD) WITH PROPOFOL N/A 07/20/2018   Procedure: ESOPHAGOGASTRODUODENOSCOPY (EGD) WITH PROPOFOL;  Surgeon: Rogene Houston, MD;  [facility];  Service: Endoscopy;  Laterality: N/A;  Possible esophageal dilation.  . IR ANGIOGRAM SELECTIVE EACH ADDITIONAL VESSEL  08/01/2018  . IR ANGIOGRAM VISCERAL SELECTIVE  08/01/2018  . IR EMBO ART  VEN HEMORR LYMPH EXTRAV  INC GUIDE  ROADMAPPING  08/01/2018  . IR IMAGING GUIDED PORT INSERTION  08/20/2018  . IR PERC PLEURAL DRAIN W/INDWELL CATH W/IMG GUIDE  07/08/2018  . IR THORACENTESIS ASP PLEURAL SPACE W/IMG GUIDE  07/07/2018  . IR US GUIDE VASC ACCESS RIGHT  08/01/2018  . PLEURAL EFFUSION DRAINAGE Left 07/13/2018   Procedure: DRAINAGE OF LOCULATED PLEURAL EFFUSION;  Surgeon: Ivin Poot, MD;  Location: [place];  Service: Thoracic;  Laterality: Left;  . REMOVAL OF PLEURAL DRAINAGE CATHETER Left 08/20/2018   Procedure: REMOVAL OF PLEURAL DRAINAGE CATHETER;  Surgeon: Ivin Poot, MD;  Location: [place];  Service: Thoracic;  Laterality: Left;  . REMOVAL OF PLEURAL DRAINAGE CATHETER Left 08/20/2018   Procedure: REMOVAL OF PLEURAL DRAINAGE CATHETER;  Surgeon: Ivin Poot, MD;  Location: [place];  Service: Thoracic;  Laterality: Left;  . TALC PLEURODESIS Left 07/13/2018   Procedure: Talc Pleuradesis;  Surgeon: Prescott Gum, Collier Salina, MD;  Location: [place];  Service: Thoracic;  Laterality: Left;  . TUBAL LIGATION Bilateral   . UTERINE ABLATION    . VIDEO ASSISTED THORACOSCOPY Left 07/13/2018   Procedure: VIDEO ASSISTED THORACOSCOPY;  Surgeon: Ivin Poot, MD;  [facility];  Service: Thoracic;  Laterality: Left;     SOCIAL HISTORY:  Social History   Socioeconomic History  . Marital status: Widowed    Spouse name: Not on file  . Number of children: 2  . Years of education: 2-College  . Highest education level: Not on file  Occupational History    Employer: [employer]  Tobacco Use  . Smoking status: Former Smoker    Packs/day: 0.50    Years: 17.00    Pack years: 8.50    Types: Cigarettes    Quit date: 06/22/2018    Years since quitting: 0.6  . Smokeless tobacco: Never Used  Substance and Sexual Activity  . Alcohol use: Yes    Alcohol/week: 1.0 standard drinks    Types: 1 Glasses of wine per week    Comment: Drinks alcohol on occasion  . Drug use: No  . Sexual activity: Not on file  Other Topics Concern  . Not on  file  Social History Narrative   Patient lives at home with her daughter.    Patient has 2 children.    Patient is widowed.    Patient is right handed.    Patient has her Associates degree.      Social Determinants of Health   Financial Resource Strain: Low Risk   . Difficulty of Paying Living Expenses: Not hard at all  Food Insecurity: No Food Insecurity  . Worried About Charity fundraiser in the Last Year: Never true  . Ran Out of Food in the Last Year: Never true  Transportation Needs: No Transportation Needs  . Lack of Transportation (Medical): No  . Lack of Transportation (Non-Medical): No  Physical Activity: Inactive  . Days of Exercise per Week: 0 days  . Minutes of Exercise per Session: 0 min  Stress: Stress Concern Present  . Feeling of Stress : Very much  Social Connections:  Somewhat Isolated  . Frequency of Communication with Friends and Family: More than three times a week  . Frequency of Social Gatherings with Friends and Family: More than three times a week  . Attends Religious Services: 1 to 4 times per year  . Active Member of Clubs or Organizations: No  . Attends Archivist Meetings: Never  . Marital Status: Widowed  Intimate Partner Violence: Not At Risk  . Fear of Current or Ex-Partner: No  . Emotionally Abused: No  . Physically Abused: No  . Sexually Abused: No    FAMILY HISTORY:  Family History  Problem Relation Age of Onset  . Hypertension Mother   . Obesity Mother   . Diabetes Mother   . Kidney disease Mother   . Peripheral vascular disease Father   . Atrial fibrillation Father   . COPD Brother   . Osteoporosis Brother   . Crohn's disease Sister   . Uterine cancer Sister 69       maternal half sister  . Breast cancer Maternal Aunt 29  . Colon cancer Neg Hx     CURRENT MEDICATIONS:  Outpatient Encounter Medications as of 01/27/2019  Medication Sig Note  . Ascorbic Acid (VITAMIN C) 1000 MG tablet Take 1,000 mg by mouth every  other day.   . bisacodyl (DULCOLAX) 5 MG EC tablet Take 5 mg by mouth 2 (two) times daily.   Marland Kitchen CARBOPLATIN IV Inject into the vein every 21 ( twenty-one) days.   . Cholecalciferol (VITAMIN D) 50 MCG (2000 UT) CAPS Take 2,000 Units by mouth daily.   Marland Kitchen dexamethasone (DECADRON) 4 MG tablet TAKE ONE TABLET BY MOUTH DAILY.   . diazepam (VALIUM) 5 MG tablet Take 1 tablet (5 mg total) by mouth at bedtime.   . gabapentin (NEURONTIN) 300 MG capsule Take 300 mg by mouth every evening.   . magnesium oxide (MAG-OX) 400 MG tablet Take 400 mg by mouth every other day.   . Oxycodone HCl 10 MG TABS Take 1 tablet (10 mg total) by mouth every 4 (four) hours as needed.   Marland Kitchen PACLITAXEL IV Inject into the vein every 21 ( twenty-one) days.   . pantoprazole (PROTONIX) 40 MG tablet TAKE ONE TABLET BY MOUTH TWICE DAILY. TAKE BEFORE A MEAL.   Marland Kitchen senna (SENOKOT) 8.6 MG TABS tablet Take 1 tablet (8.6 mg total) by mouth daily as needed for mild constipation or moderate constipation. (Patient taking differently: Take 1 tablet by mouth 2 (two) times daily. )   . acetaminophen (TYLENOL) 500 MG tablet Take 1,000 mg by mouth every 6 (six) hours as needed for moderate pain.   Marland Kitchen dicyclomine (BENTYL) 10 MG capsule Take 1 capsule (10 mg total) by mouth 4 (four) times daily -  before meals and at bedtime. (Patient not taking: Reported on 01/27/2019) 12/22/2018: Typically during chemo  . lidocaine-prilocaine (EMLA) cream Apply small amount to port a cath site and cover with plastic wrap one hour prior to chemotherapy appointments (Patient not taking: Reported on 01/27/2019)   . ondansetron (ZOFRAN-ODT) 8 MG disintegrating tablet TAKE ONE TABLET BY MOUTH EVERY 8 HOURS AS NEEDED FOR NAUSEA AND VOMITING. (Patient not taking: Reported on 01/27/2019)   . polyethylene glycol (MIRALAX / GLYCOLAX) 17 g packet Take 17 g by mouth daily as needed for moderate constipation.    . SUMAtriptan (IMITREX) 100 MG tablet TAKE ONE TABLET BY MOUTH PRN UP to TWICE  DAILY AS NEEDED. (Patient not taking: Reported on 01/27/2019)  No facility-administered encounter medications on file as of 01/27/2019.    ALLERGIES:  Allergies  Allergen Reactions  . Morphine And Related Itching  . Nortriptyline Other (See Comments)    Significant weight gain  . Topamax [Topiramate] Diarrhea and Nausea Only  . Actifed Cold-Allergy [Chlorpheniramine-Phenyleph Er] Rash  . Amoxicillin Rash    Did it involve swelling of the face/tongue/throat, SOB, or low BP? Unknown Did it involve sudden or severe rash/hives, skin peeling, or any reaction on the inside of your mouth or nose? Unknown Did you need to seek medical attention at a hospital or doctor's office? Unknown When did it last happen? teenager If all above answers are "NO", may proceed with cephalosporin use.   . Codeine Hives  . Erythromycin Rash  . Penicillins Rash    Did it involve swelling of the face/tongue/throat, SOB, or low BP? Unknown Did it involve sudden or severe rash/hives, skin peeling, or any reaction on the inside of your mouth or nose? Unknown Did you need to seek medical attention at a hospital or doctor's office? Unknown When did it last happen? teenager If all above answers are "NO", may proceed with cephalosporin use.   Michelle Holt [Pseudoephedrine Hcl] Rash     PHYSICAL EXAM:  ECOG Performance status: 1  Vitals:   01/27/19 0758 01/27/19 0813  BP: (!) 100/44 (!) 100/44  Pulse: 80 80  Resp: 18 18  Temp: (!) 97 F (36.1 C) (!) 97 F (36.1 C)  SpO2: 100% 100%   Filed Weights   01/27/19 0813  Weight: 162 lb 12.8 oz (73.8 kg)    Physical Exam Vitals reviewed.  Constitutional:      Appearance: Normal appearance.  Cardiovascular:     Rate and Rhythm: Normal rate and regular rhythm.     Heart sounds: Normal heart sounds.  Pulmonary:     Effort: Pulmonary effort is normal.     Breath sounds: Normal breath sounds.  Abdominal:     General: There is no distension.      Palpations: Abdomen is soft. There is no mass.  Skin:    General: Skin is warm.  Neurological:     General: No focal deficit present.     Mental Status: She is alert and oriented to person, place, and time.  Psychiatric:        Mood and Affect: Mood normal.        Behavior: Behavior normal.    Left posterior chest wall tenderness present.  LABORATORY DATA:  I have reviewed the labs as listed.  CBC    Component Value Date/Time   WBC 8.2 01/18/2019 1046   RBC 3.59 (L) 01/18/2019 1046   HGB 13.6 01/18/2019 1046   HCT 40.6 01/18/2019 1046   PLT 373 01/18/2019 1046   MCV 113.1 (H) 01/18/2019 1046   MCH 37.9 (H) 01/18/2019 1046   MCHC 33.5 01/18/2019 1046   RDW 11.9 01/18/2019 1046   LYMPHSABS 2.5 01/18/2019 1046   MONOABS 0.7 01/18/2019 1046   EOSABS 0.5 01/18/2019 1046   BASOSABS 0.0 01/18/2019 1046   CMP Latest Ref Rng & Units 01/18/2019 12/23/2018 12/01/2018  Glucose 70 - 99 mg/dL 99 115(H) 104(H)  BUN 6 - 20 mg/dL _0 Creatinine 0.44 - 1.00 mg/dL 0.64 0.60 0.58  Sodium 135 - 145 mmol/L 137 139 138  Potassium 3.5 - 5.1 mmol/L 4.0 4.4 3.5  Chloride 98 - 111 mmol/L 101 101 104  CO2 22 - 32 mmol/L 26 27  25  Calcium 8.9 - 10.3 mg/dL 9.8 9.9 9.5  Total Protein 6.5 - 8.1 g/dL 6.9 7.0 6.7  Total Bilirubin 0.3 - 1.2 mg/dL 0.7 0.7 0.7  Alkaline Phos 38 - 126 U/L 98 152(H) 91  AST 15 - 41 U/L 25 52(H) 30  ALT 0 - 44 U/L 25 78(H) 26       DIAGNOSTIC IMAGING:  I have independently reviewed the scans and discussed with the patient.   I have reviewed Venita Lick LPN's note and agree with the documentation.  I personally performed a face-to-face visit, made revisions and my assessment and plan is as follows.    ASSESSMENT & PLAN:   Ovarian cancer, bilateral ([place]) 1.  Clinical stage IVa ovarian cancer, positive cytology of left pleural effusion: -4 cycles of carboplatin and paclitaxel from 08/24/2018 through 12/01/2018. -CT CAP on 10/13/2018 showed decrease in size of  bilateral ovarian masses. -CTAP from 12/21/2018 independently reviewed by me showed continuous decrease with no new areas. -Germline mutation testing is negative. -Robotic assisted laparoscopic total abdominal hysterectomy and bilateral salpingo-oophorectomy and omentectomy on 12/29/2018, pathology showed high-grade serous carcinoma, [id number]. -She met with Dr. Denman George on 01/21/2019 and was recommended 3 more cycles of chemotherapy followed by surveillance visits every 3 months. -We have sent foundation 1 testing.  2.  Left lateral chest wall and abdominal pain: -She also reports pains in all joints particularly in the lower extremities. -She is taking oxycodone 10 mg 5 to 6 tablets daily on most days.  3.  Splenic hematoma: -Ligation of splenic artery on 08/01/2018.  Subsequent scan showed stable hematoma.  Hemoglobin has been stable.  4.  Peripheral neuropathy: -She has numbness in the feet with hurting.  I started her on gabapentin 300 mg 3 times a day at last visit. -She reported episodes of confusion.  We will cut back on gabapentin to 300 mg at bedtime.   Orders placed this encounter:  No orders of the defined types were placed in this encounter.     Derek Jack, MD [place] [phone]  \

## 2019-01-27 NOTE — Progress Notes (Signed)
Patient presents today for treatment and follow up visit with Dr. Delton Coombes. Labs within parameters for treatment. Vital signs stable. Patient has complaints of pain that she rates a 6/10. Patient denies any significant changes since her last visit.

## 2019-01-27 NOTE — Patient Instructions (Addendum)
[place] at [facility] Discharge Instructions  You were seen today by Dr. Delton Coombes. He went over your recent lab results. You will have treatment today. He will send in a new prescription for Effexor to help with the hot flashes. He will see you back in 3 weeks for labs, treatment and follow up.   Thank you for choosing [place] at [facility] to provide your oncology and hematology care.  To afford each patient quality time with our provider, please arrive at least 15 minutes before your scheduled appointment time.   If you have a lab appointment with the [facility] please come in thru the  Main Entrance and check in at the main information desk  You need to re-schedule your appointment should you arrive 10 or more minutes late.  We strive to give you quality time with our providers, and arriving late affects you and other patients whose appointments are after yours.  Also, if you no show three or more times for appointments you may be dismissed from the clinic at the providers discretion.     Again, thank you for choosing [facility].  Our hope is that these requests will decrease the amount of time that you wait before being seen by our physicians.       _____________________________________________________________  Should you have questions after your visit to [facility], please contact our office at [phone] between the hours of 8:00 a.m. and 4:30 p.m.  Voicemails left after 4:00 p.m. will not be returned until the following business day.  For prescription refill requests, have your pharmacy contact our office and allow 72 hours.    Cancer Center Support Programs:   > Cancer Support Group  2nd Tuesday of the month 1pm-2pm, Journey Room

## 2019-01-27 NOTE — Patient Instructions (Signed)
[facility] Discharge Instructions for Patients Receiving Chemotherapy  Today you received the following chemotherapy agents   To help prevent nausea and vomiting after your treatment, we encourage you to take your nausea medication   If you develop nausea and vomiting that is not controlled by your nausea medication, call the clinic.   BELOW ARE SYMPTOMS THAT SHOULD BE REPORTED IMMEDIATELY:  *FEVER GREATER THAN 100.5 F  *CHILLS WITH OR WITHOUT FEVER  NAUSEA AND VOMITING THAT IS NOT CONTROLLED WITH YOUR NAUSEA MEDICATION  *UNUSUAL SHORTNESS OF BREATH  *UNUSUAL BRUISING OR BLEEDING  TENDERNESS IN MOUTH AND THROAT WITH OR WITHOUT PRESENCE OF ULCERS  *URINARY PROBLEMS  *BOWEL PROBLEMS  UNUSUAL RASH Items with * indicate a potential emergency and should be followed up as soon as possible.  Feel free to call the clinic should you have any questions or concerns. The clinic phone number is [phone].  Please show the CHEMO ALERT CARD at check-in to the Emergency Department and triage nurse.   

## 2019-01-27 NOTE — Progress Notes (Signed)
Message received form ATravis LPN . Proceed with treatment. Labs reviewed by Dr. Delton Coombes.   Treatment given today per MD orders. Tolerated infusion without adverse affects. Vital signs stable. No complaints at this time. Discharged from clinic ambulatory. F/U with [facility] as scheduled.

## 2019-01-27 NOTE — Progress Notes (Signed)
Patient assessed and labs reviewed by Dr. Katragadda. Pt is good for Tx.   

## 2019-01-28 ENCOUNTER — Ambulatory Visit (HOSPITAL_COMMUNITY): Payer: Medicaid Other

## 2019-02-02 ENCOUNTER — Encounter (HOSPITAL_COMMUNITY): Payer: Self-pay | Admitting: Hematology

## 2019-02-04 ENCOUNTER — Ambulatory Visit (HOSPITAL_COMMUNITY): Payer: Medicaid Other

## 2019-02-08 ENCOUNTER — Other Ambulatory Visit (HOSPITAL_COMMUNITY): Payer: Self-pay | Admitting: *Deleted

## 2019-02-08 DIAGNOSIS — C561 Malignant neoplasm of right ovary: Secondary | ICD-10-CM

## 2019-02-08 DIAGNOSIS — C563 Malignant neoplasm of bilateral ovaries: Secondary | ICD-10-CM

## 2019-02-08 MED ORDER — OXYCODONE HCL 10 MG PO TABS: 10.0000 mg | ORAL_TABLET | ORAL | 0 refills | Status: DC | PRN

## 2019-02-12 ENCOUNTER — Telehealth: Payer: Self-pay | Admitting: *Deleted

## 2019-02-12 NOTE — Telephone Encounter (Signed)
Per request from [facility] [place] fax last office note and medicine list

## 2019-02-15 ENCOUNTER — Ambulatory Visit: Payer: MEDICAID | Admitting: Neurology

## 2019-02-15 ENCOUNTER — Other Ambulatory Visit (HOSPITAL_COMMUNITY): Payer: Self-pay | Admitting: *Deleted

## 2019-02-15 DIAGNOSIS — C561 Malignant neoplasm of right ovary: Secondary | ICD-10-CM

## 2019-02-15 DIAGNOSIS — C563 Malignant neoplasm of bilateral ovaries: Secondary | ICD-10-CM

## 2019-02-15 NOTE — Progress Notes (Signed)
Patient called today and she advised that she was exposed to [place].  She denies any symptoms and the exposure patient was asymptomatic.  She is scheduled for treatment this week on Wednesday.  Dr. Delton Coombes wants patient tested for COVID on Wednesday and move her treatment out to Monday. Patient is aware of new appointment dates and times.

## 2019-02-17 ENCOUNTER — Other Ambulatory Visit (HOSPITAL_COMMUNITY)
Admission: RE | Admit: 2019-02-17 | Discharge: 2019-02-17 | Disposition: A | Discharge status: Home / Self Care | Payer: Medicaid Other | Source: Ambulatory Visit | Attending: Hematology | Admitting: Hematology

## 2019-02-17 ENCOUNTER — Ambulatory Visit (HOSPITAL_COMMUNITY): Payer: Medicaid Other

## 2019-02-17 ENCOUNTER — Other Ambulatory Visit (HOSPITAL_COMMUNITY): Payer: Medicaid Other

## 2019-02-17 ENCOUNTER — Other Ambulatory Visit: Payer: Self-pay

## 2019-02-17 ENCOUNTER — Ambulatory Visit (HOSPITAL_COMMUNITY): Payer: Medicaid Other | Admitting: Hematology

## 2019-02-17 DIAGNOSIS — Z20822 Contact with and (suspected) exposure to covid-19: Secondary | ICD-10-CM | POA: Diagnosis not present

## 2019-02-17 DIAGNOSIS — Z01812 Encounter for preprocedural laboratory examination: Secondary | ICD-10-CM | POA: Diagnosis not present

## 2019-02-17 LAB — SARS CORONAVIRUS 2 (TAT 6-24 HRS): SARS Coronavirus 2: NEGATIVE

## 2019-02-22 ENCOUNTER — Other Ambulatory Visit: Payer: Self-pay

## 2019-02-22 ENCOUNTER — Inpatient Hospital Stay (HOSPITAL_BASED_OUTPATIENT_CLINIC_OR_DEPARTMENT_OTHER): Payer: Medicaid Other | Admitting: Hematology

## 2019-02-22 ENCOUNTER — Inpatient Hospital Stay (HOSPITAL_COMMUNITY): Payer: Medicaid Other | Attending: Hematology

## 2019-02-22 ENCOUNTER — Encounter (HOSPITAL_COMMUNITY): Payer: Self-pay | Admitting: Hematology

## 2019-02-22 ENCOUNTER — Inpatient Hospital Stay (HOSPITAL_COMMUNITY): Payer: Medicaid Other

## 2019-02-22 VITALS — BP 99/55 | HR 81 | Temp 97.3°F | Resp 18

## 2019-02-22 DIAGNOSIS — Z7952 Long term (current) use of systemic steroids: Secondary | ICD-10-CM | POA: Insufficient documentation

## 2019-02-22 DIAGNOSIS — Z5111 Encounter for antineoplastic chemotherapy: Secondary | ICD-10-CM | POA: Insufficient documentation

## 2019-02-22 DIAGNOSIS — C562 Malignant neoplasm of left ovary: Secondary | ICD-10-CM

## 2019-02-22 DIAGNOSIS — Z8049 Family history of malignant neoplasm of other genital organs: Secondary | ICD-10-CM | POA: Diagnosis not present

## 2019-02-22 DIAGNOSIS — C561 Malignant neoplasm of right ovary: Secondary | ICD-10-CM | POA: Insufficient documentation

## 2019-02-22 DIAGNOSIS — F329 Major depressive disorder, single episode, unspecified: Secondary | ICD-10-CM | POA: Diagnosis not present

## 2019-02-22 DIAGNOSIS — G629 Polyneuropathy, unspecified: Secondary | ICD-10-CM | POA: Insufficient documentation

## 2019-02-22 DIAGNOSIS — N951 Menopausal and female climacteric states: Secondary | ICD-10-CM | POA: Insufficient documentation

## 2019-02-22 DIAGNOSIS — Z803 Family history of malignant neoplasm of breast: Secondary | ICD-10-CM | POA: Diagnosis not present

## 2019-02-22 DIAGNOSIS — F419 Anxiety disorder, unspecified: Secondary | ICD-10-CM | POA: Diagnosis not present

## 2019-02-22 DIAGNOSIS — E785 Hyperlipidemia, unspecified: Secondary | ICD-10-CM | POA: Diagnosis not present

## 2019-02-22 DIAGNOSIS — Z8262 Family history of osteoporosis: Secondary | ICD-10-CM | POA: Insufficient documentation

## 2019-02-22 DIAGNOSIS — Z95828 Presence of other vascular implants and grafts: Secondary | ICD-10-CM

## 2019-02-22 DIAGNOSIS — Z79899 Other long term (current) drug therapy: Secondary | ICD-10-CM | POA: Diagnosis not present

## 2019-02-22 DIAGNOSIS — Z87891 Personal history of nicotine dependence: Secondary | ICD-10-CM | POA: Insufficient documentation

## 2019-02-22 DIAGNOSIS — Z90722 Acquired absence of ovaries, bilateral: Secondary | ICD-10-CM | POA: Diagnosis not present

## 2019-02-22 DIAGNOSIS — Z8349 Family history of other endocrine, nutritional and metabolic diseases: Secondary | ICD-10-CM | POA: Insufficient documentation

## 2019-02-22 DIAGNOSIS — C563 Malignant neoplasm of bilateral ovaries: Secondary | ICD-10-CM

## 2019-02-22 DIAGNOSIS — Z9079 Acquired absence of other genital organ(s): Secondary | ICD-10-CM | POA: Diagnosis not present

## 2019-02-22 DIAGNOSIS — Z8249 Family history of ischemic heart disease and other diseases of the circulatory system: Secondary | ICD-10-CM | POA: Diagnosis not present

## 2019-02-22 DIAGNOSIS — J910 Malignant pleural effusion: Secondary | ICD-10-CM | POA: Insufficient documentation

## 2019-02-22 DIAGNOSIS — Z90710 Acquired absence of both cervix and uterus: Secondary | ICD-10-CM | POA: Insufficient documentation

## 2019-02-22 DIAGNOSIS — Z833 Family history of diabetes mellitus: Secondary | ICD-10-CM | POA: Diagnosis not present

## 2019-02-22 LAB — CBC WITH DIFFERENTIAL/PLATELET
Abs Immature Granulocytes: 0.02 10*3/uL (ref 0.00–0.07)
Basophils Absolute: 0 10*3/uL (ref 0.0–0.1)
Basophils Relative: 1 %
Eosinophils Absolute: 0.2 10*3/uL (ref 0.0–0.5)
Eosinophils Relative: 3 %
HCT: 39.5 % (ref 36.0–46.0)
Hemoglobin: 13.1 g/dL (ref 12.0–15.0)
Immature Granulocytes: 0 %
Lymphocytes Relative: 32 %
Lymphs Abs: 1.9 10*3/uL (ref 0.7–4.0)
MCH: 37 pg — ABNORMAL HIGH (ref 26.0–34.0)
MCHC: 33.2 g/dL (ref 30.0–36.0)
MCV: 111.6 fL — ABNORMAL HIGH (ref 80.0–100.0)
Monocytes Absolute: 0.5 10*3/uL (ref 0.1–1.0)
Monocytes Relative: 9 %
Neutro Abs: 3.3 10*3/uL (ref 1.7–7.7)
Neutrophils Relative %: 55 %
Platelets: 319 10*3/uL (ref 150–400)
RBC: 3.54 MIL/uL — ABNORMAL LOW (ref 3.87–5.11)
RDW: 12.9 % (ref 11.5–15.5)
WBC: 6 10*3/uL (ref 4.0–10.5)
nRBC: 0 % (ref 0.0–0.2)

## 2019-02-22 LAB — COMPREHENSIVE METABOLIC PANEL
ALT: 27 U/L (ref 0–44)
AST: 21 U/L (ref 15–41)
Albumin: 4 g/dL (ref 3.5–5.0)
Alkaline Phosphatase: 101 U/L (ref 38–126)
Anion gap: 11 (ref 5–15)
BUN: 19 mg/dL (ref 6–20)
CO2: 26 mmol/L (ref 22–32)
Calcium: 9.6 mg/dL (ref 8.9–10.3)
Chloride: 103 mmol/L (ref 98–111)
Creatinine, Ser: 0.65 mg/dL (ref 0.44–1.00)
GFR calc Af Amer: >60 mL/min (ref >60)
GFR calc non Af Amer: >60 mL/min (ref >60)
Glucose, Bld: 105 mg/dL — ABNORMAL HIGH (ref 70–99)
Potassium: 4.6 mmol/L (ref 3.5–5.1)
Sodium: 140 mmol/L (ref 135–145)
Total Bilirubin: 0.8 mg/dL (ref 0.3–1.2)
Total Protein: 6.8 g/dL (ref 6.5–8.1)

## 2019-02-22 LAB — MAGNESIUM: Magnesium: 1.8 mg/dL (ref 1.7–2.4)

## 2019-02-22 MED ORDER — ACETAMINOPHEN 325 MG PO TABS
ORAL_TABLET | ORAL | Status: AC
  Filled 2019-02-22: qty 2

## 2019-02-22 MED ORDER — ALTEPLASE 2 MG IJ SOLR
2.0000 mg | Freq: Once | INTRAMUSCULAR | Status: AC
  Administered 2019-02-22: 2 mg

## 2019-02-22 MED ORDER — SODIUM CHLORIDE 0.9 % IV SOLN
Freq: Once | INTRAVENOUS | Status: AC
  Filled 2019-02-22: qty 5

## 2019-02-22 MED ORDER — HEPARIN SOD (PORK) LOCK FLUSH 100 UNIT/ML IV SOLN
500.0000 [IU] | Freq: Once | INTRAVENOUS | Status: AC | PRN
  Administered 2019-02-22: 500 [IU]

## 2019-02-22 MED ORDER — ACETAMINOPHEN 325 MG PO TABS
650.0000 mg | ORAL_TABLET | Freq: Once | ORAL | Status: AC
  Administered 2019-02-22: 650 mg via ORAL

## 2019-02-22 MED ORDER — PALONOSETRON HCL INJECTION 0.25 MG/5ML
0.2500 mg | Freq: Once | INTRAVENOUS | Status: AC
  Administered 2019-02-22: 0.25 mg via INTRAVENOUS
  Filled 2019-02-22: qty 5

## 2019-02-22 MED ORDER — FAMOTIDINE IN NACL 20-0.9 MG/50ML-% IV SOLN
20.0000 mg | Freq: Once | INTRAVENOUS | Status: AC
  Administered 2019-02-22: 20 mg via INTRAVENOUS
  Filled 2019-02-22: qty 50

## 2019-02-22 MED ORDER — STERILE WATER FOR INJECTION IJ SOLN
INTRAMUSCULAR | Status: AC
  Filled 2019-02-22: qty 10

## 2019-02-22 MED ORDER — SODIUM CHLORIDE 0.9 % IV SOLN
736.8000 mg | Freq: Once | INTRAVENOUS | Status: AC
  Administered 2019-02-22: 740 mg via INTRAVENOUS
  Filled 2019-02-22: qty 74

## 2019-02-22 MED ORDER — SODIUM CHLORIDE 0.9 % IV SOLN
175.0000 mg/m2 | Freq: Once | INTRAVENOUS | Status: AC
  Administered 2019-02-22: 324 mg via INTRAVENOUS
  Filled 2019-02-22: qty 54

## 2019-02-22 MED ORDER — SODIUM CHLORIDE 0.9 % IV SOLN: Freq: Once | INTRAVENOUS | Status: AC

## 2019-02-22 MED ORDER — ALTEPLASE 2 MG IJ SOLR
INTRAMUSCULAR | Status: AC
  Filled 2019-02-22: qty 2

## 2019-02-22 MED ORDER — SODIUM CHLORIDE 0.9% FLUSH
10.0000 mL | INTRAVENOUS | Status: DC | PRN
  Administered 2019-02-22: 10 mL

## 2019-02-22 MED ORDER — DIPHENHYDRAMINE HCL 50 MG/ML IJ SOLN
50.0000 mg | Freq: Once | INTRAMUSCULAR | Status: AC
  Administered 2019-02-22: 50 mg via INTRAVENOUS
  Filled 2019-02-22: qty 1

## 2019-02-22 NOTE — Progress Notes (Signed)
[place], [place] [address]   CLINIC:  Medical Oncology/Hematology  PCP:  [name], MD [place] [address] [phone]   REASON FOR VISIT:  Follow-up of high-grade serous ovarian cancer.   BRIEF ONCOLOGIC HISTORY:  Oncology History  Ovarian cancer, bilateral ([place])  07/07/2018 Pathology Results   PLEURAL FLUID, LEFT (SPECIMEN 1 OF 1, COLLECTED 07/07/18): - MALIGNANT CELLS CONSISTENT WITH ADENOCARCINOMA - SEE COMMENT  Source Pleural Fluid, (Specimen 1 of 1, collected on 07/07/2018) Gross Specimen: Received is/are 1000cc of bloody red fluid with tissue. (TC:tc) Prepared: # Smears: 0 # Concentration Technique Slides (i.e. ThinPrep): 1 # Cell Block: 1 Conventional Additional Studies: Two Hematology slides labeled [id number] Comment The malignant cells are positive for cytokeratin 7, p53, WT-1, Pax-8, Moc31, ER (weak) and EMA but negative for cytokeratin 20, TTF-1, GATA-3, CDX-2 and D2-40. Overall, the phenotype is consistent with a gynecologic primary; clinical correlation recommended.   07/07/2018 Procedure   Successful ultrasound guided left thoracentesis yielding 2.0 L of pleural fluid   07/08/2018 Procedure   1. Technically successful placement of left 14 French pigtail chest drain, placed to Pleur-evac water-seal.   07/08/2018 Procedure   1. Technically successful five [place] double lumen power injectable PICC placement   07/09/2018 Imaging   Ct chest 1. There is a moderate, loculated left hydropneumothorax with a small air component and moderate fluid component. The largest loculated component is located posteriorly. There is a pigtail drainage catheter about the lateral pleural space. There is no obvious etiology, such as obvious mass or pleural disease.   2. There is a small right pleural effusion with associated atelectasis or consolidation and a subpleural consolidation of the superior segment right lower lobe (series  4, image 56), of uncertain significance, possibly infectious or inflammatory   07/10/2018 Imaging   Ct abdomen and pelvis: 1. The bilateral ovaries are enlarged by heterogeneous appearing cystic lesions, measuring 5.3 x 4.2 cm on the right (series 4, image 72) and 4.5 x 3.2 cm on the left (series 4, image 75). Consider dedicated pelvic ultrasound and/or pelvic MRI to further evaluate for solid components given high suspicion for GYN primary malignancy.   2. No other evidence of mass and no lymphadenopathy in the abdomen or pelvis.   3. Trace ascites. There is some suggestion of omental and peritoneal nodularity (e.g. Series 4, image 2), concerning for peritoneal metastatic disease.    4. Loculated left-sided pleural effusion with left-sided pleural drainage catheter in position. Small right pleural effusion   07/13/2018 Surgery   OPERATION: 1.  Left VATS (video-assisted thoracoscopic surgery) for drainage of loculated pleural effusion. 2.  Talc pleurodesis for malignant pleural effusion. 3.  Placement of PleurX catheter for management of malignant pleural effusion. 4.  Placement of On-Q analgesia catheter system.    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   07/13/2018 Pathology Results   Pleura, peel, Left Pleural - FIBRO-FIBRINOUS PLEURITIS - NEGATIVE FOR MALIGNANCY   07/18/2018 Initial Diagnosis   Ovarian cancer, bilateral ([place])   07/20/2018 Procedure   EGD impression: Normal proximal esophagus and mid esophagus. Mild distal esophageal rings; dilation not performed because of esophagitis. LA Grade C reflux esophagitis. Z-line regular, 30 cm from the incisors. 5 cm hiatal hernia. Non-bleeding gastric ulcer with no stigmata of bleeding. Gastritis.  Duodenal erosions without bleeding. Normal  second portion of the duodenum. No specimens collected.   08/19/2018 Genetic Testing   Negative genetic testing on the common hereditary cancer panel.  The Common Hereditary Gene Panel offered by [facility] includes sequencing and/or deletion duplication testing of the following 48 genes: APC, ATM, AXIN2, BARD1, BMPR1A, BRCA1, BRCA2, BRIP1, CDH1, CDK4, CDKN2A (p14ARF), CDKN2A (p16INK4a), CHEK2, CTNNA1, DICER1, EPCAM (Deletion/duplication testing only), GREM1 (promoter region deletion/duplication testing only), KIT, MEN1, MLH1, MSH2, MSH3, MSH6, MUTYH, NBN, NF1, NHTL1, PALB2, PDGFRA, PMS2, POLD1, POLE, PTEN, RAD50, RAD51C, RAD51D, RNF43, SDHB, SDHC, SDHD, SMAD4, SMARCA4. STK11, TP53, TSC1, TSC2, and VHL.  The following genes were evaluated for sequence changes only: SDHA and HOXB13 c.251G>A variant only. The report date is August 19, 2018.   08/24/2018 -  Chemotherapy   The patient had palonosetron (ALOXI) injection 0.25 mg, 0.25 mg, Intravenous,  Once, 6 of 7 cycles Administration: 0.25 mg (08/24/2018), 0.25 mg (09/14/2018), 0.25 mg (10/05/2018), 0.25 mg (12/01/2018), 0.25 mg (01/27/2019) CARBOplatin (PARAPLATIN) 740 mg in sodium chloride 0.9 % 250 mL chemo infusion, 740 mg (100 % of original dose 742.8 mg), Intravenous,  Once, 6 of 7 cycles Dose modification:   (original dose 742.8 mg, Cycle 1),   (original dose 736.8 mg, Cycle 2),   (original dose 736.8 mg, Cycle 3),   (original dose 736.8 mg, Cycle 4),   (original dose 736.8 mg, Cycle 5),   (original dose 736.8 mg, Cycle 6) Administration: 740 mg (08/24/2018), 740 mg (09/14/2018), 740 mg (10/05/2018), 740 mg (12/01/2018), 740 mg (01/27/2019) PACLitaxel (TAXOL) 324 mg in sodium chloride 0.9 % 500 mL chemo infusion (> 30m/m2), 175 mg/m2 = 324 mg, Intravenous,  Once, 6 of 7 cycles Administration: 324 mg (08/24/2018), 324 mg (09/14/2018), 324 mg (10/05/2018), 324 mg (12/01/2018), 324 mg (01/27/2019) fosaprepitant (EMEND) 150 mg, dexamethasone (DECADRON) 12 mg in sodium  chloride 0.9 % 145 mL IVPB, , Intravenous,  Once, 6 of 7 cycles Administration:  (08/24/2018),  (09/14/2018),  (10/05/2018),  (12/01/2018),  (01/27/2019)  for chemotherapy treatment.       CANCER STAGING: Cancer Staging No matching staging information was found for the patient.   INTERVAL HISTORY:  Ms. [id number]. female seen for follow-up and toxicity assessment prior to next cycle of carboplatin and paclitaxel.  After last treatment, she felt tired for 7 to 10 days.  She reports numbness in the medial 3 fingers of both hands predominantly at nighttime.  She is taking gabapentin.  Her pain is well controlled with 4 tablets of oxycodone daily.  Appetite is 100%.  Energy levels are 75%.  REVIEW OF SYSTEMS:  Review of Systems  Neurological: Positive for numbness.  Psychiatric/Behavioral: Positive for sleep disturbance.  All other systems reviewed and are negative.    PAST MEDICAL/SURGICAL HISTORY:  Past Medical History:  Diagnosis Date  . Anxiety and depression   . Arthritis of facet joints at multiple vertebral levels    L5-S1  . Constipation   . Dyslipidemia   . Family history of breast cancer   . Family history of uterine cancer   . History of kidney stones   . Insomnia   . Irritable bowel syndrome   . Migraine   . Muscle tension headache   . Neuropathy of finger   . Ovarian carcinoma ([place]   . Plantar fasciitis of right foot   . Port-A-Cath in place 08/20/2018   Past Surgical History:  Procedure Laterality Date  . CHOLECYSTECTOMY  2008  . COLONOSCOPY  N/A 08/13/2013   Procedure: COLONOSCOPY;  Surgeon: Rogene Houston, MD;  [facility];  Service: Endoscopy;  Laterality: N/A;  230-moved to 145 Ann to notify pt  . ESOPHAGOGASTRODUODENOSCOPY    . ESOPHAGOGASTRODUODENOSCOPY (EGD) WITH PROPOFOL N/A 07/20/2018   Procedure: ESOPHAGOGASTRODUODENOSCOPY (EGD) WITH PROPOFOL;  Surgeon: Rogene Houston, MD;  [facility];  Service: Endoscopy;  Laterality: N/A;   Possible esophageal dilation.  . IR ANGIOGRAM SELECTIVE EACH ADDITIONAL VESSEL  08/01/2018  . IR ANGIOGRAM VISCERAL SELECTIVE  08/01/2018  . IR EMBO ART  VEN HEMORR LYMPH EXTRAV  INC GUIDE ROADMAPPING  08/01/2018  . IR IMAGING GUIDED PORT INSERTION  08/20/2018  . IR PERC PLEURAL DRAIN W/INDWELL CATH W/IMG GUIDE  07/08/2018  . IR THORACENTESIS ASP PLEURAL SPACE W/IMG GUIDE  07/07/2018  . IR US GUIDE VASC ACCESS RIGHT  08/01/2018  . PLEURAL EFFUSION DRAINAGE Left 07/13/2018   Procedure: DRAINAGE OF LOCULATED PLEURAL EFFUSION;  Surgeon: Ivin Poot, MD;  Location: [facility];  Service: Thoracic;  Laterality: Left;  . REMOVAL OF PLEURAL DRAINAGE CATHETER Left 08/20/2018   Procedure: REMOVAL OF PLEURAL DRAINAGE CATHETER;  Surgeon: Ivin Poot, MD;  Location: [facility];  Service: Thoracic;  Laterality: Left;  . REMOVAL OF PLEURAL DRAINAGE CATHETER Left 08/20/2018   Procedure: REMOVAL OF PLEURAL DRAINAGE CATHETER;  Surgeon: Ivin Poot, MD;  Location: [facility];  Service: Thoracic;  Laterality: Left;  . TALC PLEURODESIS Left 07/13/2018   Procedure: Talc Pleuradesis;  Surgeon: Prescott Gum, Collier Salina, MD;  Location: [facility];  Service: Thoracic;  Laterality: Left;  . TUBAL LIGATION Bilateral   . UTERINE ABLATION    . VIDEO ASSISTED THORACOSCOPY Left 07/13/2018   Procedure: VIDEO ASSISTED THORACOSCOPY;  Surgeon: Ivin Poot, MD;  Location[facility];  Service: Thoracic;  Laterality: Left;     SOCIAL HISTORY:  Social History   Socioeconomic History  . Marital status: Widowed    Spouse name: Not on file  . Number of children: 2  . Years of education: 2-College  . Highest education level: Not on file  Occupational History    Employer: [employer]  Tobacco Use  . Smoking status: Former Smoker    Packs/day: 0.50    Years: 17.00    Pack years: 8.50    Types: Cigarettes    Quit date: 06/22/2018    Years since quitting: 0.6  . Smokeless tobacco: Never Used  Substance and Sexual Activity  . Alcohol use: Yes     Alcohol/week: 1.0 standard drinks    Types: 1 Glasses of wine per week    Comment: Drinks alcohol on occasion  . Drug use: No  . Sexual activity: Not on file  Other Topics Concern  . Not on file  Social History Narrative   Patient lives at home with her daughter.    Patient has 2 children.    Patient is widowed.    Patient is right handed.    Patient has her Associates degree.      Social Determinants of Health   Financial Resource Strain: Low Risk   . Difficulty of Paying Living Expenses: Not hard at all  Food Insecurity: No Food Insecurity  . Worried About Charity fundraiser in the Last Year: Never true  . Ran Out of Food in the Last Year: Never true  Transportation Needs: No Transportation Needs  . Lack of Transportation (Medical): No  . Lack of Transportation (Non-Medical): No  Physical Activity: Inactive  .  Days of Exercise per Week: 0 days  . Minutes of Exercise per Session: 0 min  Stress: Stress Concern Present  . Feeling of Stress : Very much  Social Connections: Somewhat Isolated  . Frequency of Communication with Friends and Family: More than three times a week  . Frequency of Social Gatherings with Friends and Family: More than three times a week  . Attends Religious Services: 1 to 4 times per year  . Active Member of Clubs or Organizations: No  . Attends Archivist Meetings: Never  . Marital Status: Widowed  Intimate Partner Violence: Not At Risk  . Fear of Current or Ex-Partner: No  . Emotionally Abused: No  . Physically Abused: No  . Sexually Abused: No    FAMILY HISTORY:  Family History  Problem Relation Age of Onset  . Hypertension Mother   . Obesity Mother   . Diabetes Mother   . Kidney disease Mother   . Peripheral vascular disease Father   . Atrial fibrillation Father   . COPD Brother   . Osteoporosis Brother   . Crohn's disease Sister   . Uterine cancer Sister 70       maternal half sister  . Breast cancer Maternal Aunt 63  .  Colon cancer Neg Hx     CURRENT MEDICATIONS:  Outpatient Encounter Medications as of 02/22/2019  Medication Sig Note  . Ascorbic Acid (VITAMIN C) 1000 MG tablet Take 1,000 mg by mouth every other day.   . bisacodyl (DULCOLAX) 5 MG EC tablet Take 5 mg by mouth 2 (two) times daily.   Marland Kitchen CARBOPLATIN IV Inject into the vein every 21 ( twenty-one) days.   . Cholecalciferol (VITAMIN D) 50 MCG (2000 UT) CAPS Take 2,000 Units by mouth daily.   . diazepam (VALIUM) 5 MG tablet Take 1 tablet (5 mg total) by mouth at bedtime.   . gabapentin (NEURONTIN) 300 MG capsule Take 300 mg by mouth every evening.   . magnesium oxide (MAG-OX) 400 MG tablet Take 400 mg by mouth every other day.   Marland Kitchen PACLITAXEL IV Inject into the vein every 21 ( twenty-one) days.   . pantoprazole (PROTONIX) 40 MG tablet TAKE ONE TABLET BY MOUTH TWICE DAILY. TAKE BEFORE A MEAL.   Marland Kitchen senna (SENOKOT) 8.6 MG TABS tablet Take 1 tablet (8.6 mg total) by mouth daily as needed for mild constipation or moderate constipation. (Patient taking differently: Take 1 tablet by mouth 2 (two) times daily. )   . venlafaxine XR (EFFEXOR-XR) 37.5 MG 24 hr capsule Take 1 capsule (37.5 mg total) by mouth daily with breakfast. Take 1 capsule daily for for 7 days.  Take 2 capsules daily after 1 week.   Marland Kitchen acetaminophen (TYLENOL) 500 MG tablet Take 1,000 mg by mouth every 6 (six) hours as needed for moderate pain.   Marland Kitchen dexamethasone (DECADRON) 4 MG tablet TAKE ONE TABLET BY MOUTH DAILY. (Patient not taking: Reported on 02/22/2019)   . dicyclomine (BENTYL) 10 MG capsule Take 1 capsule (10 mg total) by mouth 4 (four) times daily -  before meals and at bedtime. (Patient not taking: Reported on 01/27/2019) 12/22/2018: Typically during chemo  . lidocaine-prilocaine (EMLA) cream Apply small amount to port a cath site and cover with plastic wrap one hour prior to chemotherapy appointments (Patient not taking: Reported on 01/27/2019)   . ondansetron (ZOFRAN-ODT) 8 MG disintegrating  tablet TAKE ONE TABLET BY MOUTH EVERY 8 HOURS AS NEEDED FOR NAUSEA AND VOMITING. (Patient not taking:  Reported on 01/27/2019)   . Oxycodone HCl 10 MG TABS Take 1 tablet (10 mg total) by mouth every 4 (four) hours as needed. (Patient not taking: Reported on 02/22/2019)   . polyethylene glycol (MIRALAX / GLYCOLAX) 17 g packet Take 17 g by mouth daily as needed for moderate constipation.    . SUMAtriptan (IMITREX) 100 MG tablet TAKE ONE TABLET BY MOUTH PRN UP to TWICE DAILY AS NEEDED. (Patient not taking: Reported on 01/27/2019)    No facility-administered encounter medications on file as of 02/22/2019.    ALLERGIES:  Allergies  Allergen Reactions  . Morphine And Related Itching  . Nortriptyline Other (See Comments)    Significant weight gain  . Topamax [Topiramate] Diarrhea and Nausea Only  . Actifed Cold-Allergy [Chlorpheniramine-Phenyleph Er] Rash  . Amoxicillin Rash    Did it involve swelling of the face/tongue/throat, SOB, or low BP? Unknown Did it involve sudden or severe rash/hives, skin peeling, or any reaction on the inside of your mouth or nose? Unknown Did you need to seek medical attention at a hospital or doctor's office? Unknown When did it last happen? teenager If all above answers are "NO", may proceed with cephalosporin use.   . Codeine Hives  . Erythromycin Rash  . Penicillins Rash    Did it involve swelling of the face/tongue/throat, SOB, or low BP? Unknown Did it involve sudden or severe rash/hives, skin peeling, or any reaction on the inside of your mouth or nose? Unknown Did you need to seek medical attention at a hospital or doctor's office? Unknown When did it last happen? teenager If all above answers are "NO", may proceed with cephalosporin use.   [name] [Pseudoephedrine Hcl] Rash     PHYSICAL EXAM:  ECOG Performance status: 1  Vitals:   02/22/19 0810  BP: (!) 105/54  Pulse: 88  Resp: 19  Temp: (!) 97.3 F (36.3 C)  SpO2: 97%   Filed  Weights   02/22/19 0810  Weight: 163 lb 6.4 oz (74.1 kg)    Physical Exam Vitals reviewed.  Constitutional:      Appearance: Normal appearance.  Cardiovascular:     Rate and Rhythm: Normal rate and regular rhythm.     Heart sounds: Normal heart sounds.  Pulmonary:     Effort: Pulmonary effort is normal.     Breath sounds: Normal breath sounds.  Abdominal:     General: There is no distension.     Palpations: Abdomen is soft. There is no mass.  Skin:    General: Skin is warm.  Neurological:     General: No focal deficit present.     Mental Status: She is alert and oriented to person, place, and time.  Psychiatric:        Mood and Affect: Mood normal.        Behavior: Behavior normal.      LABORATORY DATA:  I have reviewed the labs as listed.  CBC    Component Value Date/Time   WBC 6.0 02/22/2019 0759   RBC 3.54 (L) 02/22/2019 0759   HGB 13.1 02/22/2019 0759   HCT 39.5 02/22/2019 0759   PLT 319 02/22/2019 0759   MCV 111.6 (H) 02/22/2019 0759   MCH 37.0 (H) 02/22/2019 0759   MCHC 33.2 02/22/2019 0759   RDW 12.9 02/22/2019 0759   LYMPHSABS 1.9 02/22/2019 0759   MONOABS 0.5 02/22/2019 0759   EOSABS 0.2 02/22/2019 0759   BASOSABS 0.0 02/22/2019 0759   CMP Latest Ref Rng &  Units 02/22/2019 01/27/2019 01/18/2019  Glucose 70 - 99 mg/dL 105(H) 105(H) 99  BUN 6 - 20 mg/dL _0 Creatinine 0.44 - 1.00 mg/dL 0.65 0.59 0.64  Sodium 135 - 145 mmol/L 140 140 137  Potassium 3.5 - 5.1 mmol/L 4.6 3.7 4.0  Chloride 98 - 111 mmol/L 103 103 101  CO2 22 - 32 mmol/L _1 Calcium 8.9 - 10.3 mg/dL 9.6 9.7 9.8  Total Protein 6.5 - 8.1 g/dL 6.8 6.6 6.9  Total Bilirubin 0.3 - 1.2 mg/dL 0.8 0.7 0.7  Alkaline Phos 38 - 126 U/L 101 89 98  AST 15 - 41 U/L _2 ALT 0 - 44 U/L _3 DIAGNOSTIC IMAGING:  I have independently reviewed the scans and discussed with the patient.   I have reviewed Venita Lick LPN's note and agree with the documentation.  I personally  performed a face-to-face visit, made revisions and my assessment and plan is as follows.    ASSESSMENT & PLAN:   Ovarian cancer, bilateral ([place]) 1.  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 -CTAP on 12/21/2018 reviewed by me showed continuous decrease in the tumor with no new areas. -Germline mutation testing is negative. -Robotic assisted laparoscopic TAH, BSO, omentectomy on 12/29/2018, pathology showing high-grade serous carcinoma, PT3P NX. -She was recommended 3 more cycles of adjuvant chemotherapy followed by surveillance every 3 months. -Cycle 5 of chemotherapy on 01/27/2019.  She felt tired for 7 to 10 days after last treatment. -We have reviewed her labs including white count and liver enzymes which are in the normal range.  She will proceed with her cycle 6 today.  She will come back in 3 weeks for follow-up. -Her port is not giving blood return, however it flushes very easily.  We will place Cathflo in the port.  2.  Left lateral chest wall pain and posterior rib pain: -She is taking oxycodone 10 mg 4 times daily.  Occasionally she needs it 5 times a day.  3.  Peripheral neuropathy: -She will continue gabapentin 300 mg at bedtime.  She could not tolerate 3 times a day dose because of drowsiness.  4.  Hot flashes: -She will continue venlafaxine 75 mg daily.   Orders placed this encounter:  No orders of the defined types were placed in this encounter.     Derek Jack, MD [place] [phone]  \

## 2019-02-22 NOTE — Progress Notes (Signed)
Patient has been assessed, vital signs and labs have been reviewed by Dr. Katragadda. ANC, Creatinine, LFTs, and Platelets are within treatment parameters per Dr. Katragadda. The patient is good to proceed with treatment at this time.  

## 2019-02-22 NOTE — Patient Instructions (Addendum)
[facility] at [facility] Discharge Instructions  You were seen today by Dr. Katragadda. He went over your recent lab results. He will see you back in 3 weeks for labs, treatment and follow up.   Thank you for choosing [facility] at [facility] to provide your oncology and hematology care.  To afford each patient quality time with our provider, please arrive at least 15 minutes before your scheduled appointment time.   If you have a lab appointment with the Cancer Center please come in thru the  Main Entrance and check in at the main information desk  You need to re-schedule your appointment should you arrive 10 or more minutes late.  We strive to give you quality time with our providers, and arriving late affects you and other patients whose appointments are after yours.  Also, if you no show three or more times for appointments you may be dismissed from the clinic at the providers discretion.     Again, thank you for choosing [facility].  Our hope is that these requests will decrease the amount of time that you wait before being seen by our physicians.       _____________________________________________________________  Should you have questions after your visit to [facility], please contact our office at [phone] between the hours of 8:00 a.m. and 4:30 p.m.  Voicemails left after 4:00 p.m. will not be returned until the following business day.  For prescription refill requests, have your pharmacy contact our office and allow 72 hours.    Cancer Center Support Programs:   > Cancer Support Group  2nd Tuesday of the month 1pm-2pm, Journey Room    

## 2019-02-22 NOTE — Assessment & Plan Note (Signed)
1.  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 -CTAP on 12/21/2018 reviewed by me showed continuous decrease in the tumor with no new areas. -Germline mutation testing is negative. -Robotic assisted laparoscopic TAH, BSO, omentectomy on 12/29/2018, pathology showing high-grade serous carcinoma, PT3P NX. -She was recommended 3 more cycles of adjuvant chemotherapy followed by surveillance every 3 months. -Cycle 5 of chemotherapy on 01/27/2019.  She felt tired for 7 to 10 days after last treatment. -We have reviewed her labs including white count and liver enzymes which are in the normal range.  She will proceed with her cycle 6 today.  She will come back in 3 weeks for follow-up. -Her port is not giving blood return, however it flushes very easily.  We will place Cathflo in the port.  2.  Left lateral chest wall pain and posterior rib pain: -She is taking oxycodone 10 mg 4 times daily.  Occasionally she needs it 5 times a day.  3.  Peripheral neuropathy: -She will continue gabapentin 300 mg at bedtime.  She could not tolerate 3 times a day dose because of drowsiness.  4.  Hot flashes: -She will continue venlafaxine 75 mg daily.

## 2019-02-22 NOTE — Progress Notes (Signed)
Peripheral lab work this morning.

## 2019-02-22 NOTE — Progress Notes (Signed)
To treatment room for oncology follow up and chemotherapy.  Patient stated tingling in three fingers bilaterally and on the balls of her feet.  Able to pick up change and use zippers and open jar lids.  Denied falls or stumbles.  Generalized body aches and fatigue.  No s/s of distress noted.    Alteplase at 0841 for port.  Patient stated she can taste the saline flushes and denied pain. No bruising or swelling noted with port.  Dr. Delton Coombes aware.    No blood return noted with port after alteplase.  Dr. Delton Coombes notified.  Recheck in one hour verbal order Dr. Delton Coombes.   0950-blood return noted with port.  Waste of 10 ml before starting normal saline KVO.    Patient complains of headache like a migraine.  Requesting tylenol.  Ok to give tylenol 650 mg verbal order Dr. Delton Coombes.  Pain rated an 8 on scale.    Patient tolerated chemotherapy with no complaints voiced.  Port site clean and dry with no bruising or swelling noted at site.  Good blood return noted before and after administration of chemotherapy.  Band aid applied.  Patient left ambulatory with VSS and no s/s of distress noted.

## 2019-03-01 ENCOUNTER — Ambulatory Visit (HOSPITAL_COMMUNITY): Payer: Medicaid Other | Admitting: Hematology

## 2019-03-01 ENCOUNTER — Other Ambulatory Visit (HOSPITAL_COMMUNITY): Payer: Medicaid Other

## 2019-03-03 ENCOUNTER — Other Ambulatory Visit (HOSPITAL_COMMUNITY): Payer: Self-pay | Admitting: Hematology

## 2019-03-03 DIAGNOSIS — R232 Flushing: Secondary | ICD-10-CM

## 2019-03-15 ENCOUNTER — Other Ambulatory Visit: Payer: Self-pay

## 2019-03-15 ENCOUNTER — Inpatient Hospital Stay (HOSPITAL_COMMUNITY): Payer: Medicaid Other | Attending: Hematology

## 2019-03-15 ENCOUNTER — Encounter (HOSPITAL_COMMUNITY): Payer: Self-pay | Admitting: Hematology

## 2019-03-15 ENCOUNTER — Inpatient Hospital Stay (HOSPITAL_BASED_OUTPATIENT_CLINIC_OR_DEPARTMENT_OTHER): Payer: Medicaid Other | Admitting: Hematology

## 2019-03-15 ENCOUNTER — Other Ambulatory Visit (HOSPITAL_COMMUNITY): Payer: Self-pay | Admitting: Hematology

## 2019-03-15 ENCOUNTER — Inpatient Hospital Stay (HOSPITAL_COMMUNITY): Payer: Medicaid Other

## 2019-03-15 VITALS — BP 109/60 | HR 82 | Temp 97.2°F | Resp 18

## 2019-03-15 VITALS — BP 113/68 | HR 86 | Temp 97.1°F | Resp 17 | Wt 172.2 lb

## 2019-03-15 DIAGNOSIS — M545 Low back pain: Secondary | ICD-10-CM | POA: Diagnosis not present

## 2019-03-15 DIAGNOSIS — Z79899 Other long term (current) drug therapy: Secondary | ICD-10-CM | POA: Diagnosis not present

## 2019-03-15 DIAGNOSIS — Z8049 Family history of malignant neoplasm of other genital organs: Secondary | ICD-10-CM | POA: Insufficient documentation

## 2019-03-15 DIAGNOSIS — F419 Anxiety disorder, unspecified: Secondary | ICD-10-CM | POA: Insufficient documentation

## 2019-03-15 DIAGNOSIS — E785 Hyperlipidemia, unspecified: Secondary | ICD-10-CM | POA: Diagnosis not present

## 2019-03-15 DIAGNOSIS — C561 Malignant neoplasm of right ovary: Secondary | ICD-10-CM | POA: Diagnosis present

## 2019-03-15 DIAGNOSIS — Z95828 Presence of other vascular implants and grafts: Secondary | ICD-10-CM

## 2019-03-15 DIAGNOSIS — Z833 Family history of diabetes mellitus: Secondary | ICD-10-CM | POA: Insufficient documentation

## 2019-03-15 DIAGNOSIS — G629 Polyneuropathy, unspecified: Secondary | ICD-10-CM | POA: Insufficient documentation

## 2019-03-15 DIAGNOSIS — C563 Malignant neoplasm of bilateral ovaries: Secondary | ICD-10-CM

## 2019-03-15 DIAGNOSIS — Z803 Family history of malignant neoplasm of breast: Secondary | ICD-10-CM | POA: Diagnosis not present

## 2019-03-15 DIAGNOSIS — R232 Flushing: Secondary | ICD-10-CM

## 2019-03-15 DIAGNOSIS — Z90710 Acquired absence of both cervix and uterus: Secondary | ICD-10-CM | POA: Insufficient documentation

## 2019-03-15 DIAGNOSIS — Z5111 Encounter for antineoplastic chemotherapy: Secondary | ICD-10-CM | POA: Insufficient documentation

## 2019-03-15 DIAGNOSIS — C562 Malignant neoplasm of left ovary: Secondary | ICD-10-CM | POA: Insufficient documentation

## 2019-03-15 DIAGNOSIS — Z8262 Family history of osteoporosis: Secondary | ICD-10-CM | POA: Insufficient documentation

## 2019-03-15 DIAGNOSIS — F329 Major depressive disorder, single episode, unspecified: Secondary | ICD-10-CM | POA: Insufficient documentation

## 2019-03-15 DIAGNOSIS — N951 Menopausal and female climacteric states: Secondary | ICD-10-CM | POA: Insufficient documentation

## 2019-03-15 DIAGNOSIS — Z90722 Acquired absence of ovaries, bilateral: Secondary | ICD-10-CM | POA: Insufficient documentation

## 2019-03-15 DIAGNOSIS — Z8249 Family history of ischemic heart disease and other diseases of the circulatory system: Secondary | ICD-10-CM | POA: Insufficient documentation

## 2019-03-15 DIAGNOSIS — R1012 Left upper quadrant pain: Secondary | ICD-10-CM | POA: Diagnosis not present

## 2019-03-15 DIAGNOSIS — Z7952 Long term (current) use of systemic steroids: Secondary | ICD-10-CM | POA: Diagnosis not present

## 2019-03-15 DIAGNOSIS — Z9079 Acquired absence of other genital organ(s): Secondary | ICD-10-CM | POA: Insufficient documentation

## 2019-03-15 DIAGNOSIS — Z8349 Family history of other endocrine, nutritional and metabolic diseases: Secondary | ICD-10-CM | POA: Diagnosis not present

## 2019-03-15 DIAGNOSIS — Z87891 Personal history of nicotine dependence: Secondary | ICD-10-CM | POA: Insufficient documentation

## 2019-03-15 LAB — CBC WITH DIFFERENTIAL/PLATELET
Abs Immature Granulocytes: 0.03 10*3/uL (ref 0.00–0.07)
Basophils Absolute: 0 10*3/uL (ref 0.0–0.1)
Basophils Relative: 0 %
Eosinophils Absolute: 0.1 10*3/uL (ref 0.0–0.5)
Eosinophils Relative: 1 %
HCT: 35.9 % — ABNORMAL LOW (ref 36.0–46.0)
Hemoglobin: 11.6 g/dL — ABNORMAL LOW (ref 12.0–15.0)
Immature Granulocytes: 0 %
Lymphocytes Relative: 30 %
Lymphs Abs: 2.2 10*3/uL (ref 0.7–4.0)
MCH: 37.1 pg — ABNORMAL HIGH (ref 26.0–34.0)
MCHC: 32.3 g/dL (ref 30.0–36.0)
MCV: 114.7 fL — ABNORMAL HIGH (ref 80.0–100.0)
Monocytes Absolute: 0.5 10*3/uL (ref 0.1–1.0)
Monocytes Relative: 7 %
Neutro Abs: 4.4 10*3/uL (ref 1.7–7.7)
Neutrophils Relative %: 62 %
Platelets: 148 10*3/uL — ABNORMAL LOW (ref 150–400)
RBC: 3.13 MIL/uL — ABNORMAL LOW (ref 3.87–5.11)
RDW: 14.1 % (ref 11.5–15.5)
WBC: 7.2 10*3/uL (ref 4.0–10.5)
nRBC: 0 % (ref 0.0–0.2)

## 2019-03-15 LAB — COMPREHENSIVE METABOLIC PANEL
ALT: 25 U/L (ref 0–44)
AST: 27 U/L (ref 15–41)
Albumin: 3.6 g/dL (ref 3.5–5.0)
Alkaline Phosphatase: 97 U/L (ref 38–126)
Anion gap: 9 (ref 5–15)
BUN: 13 mg/dL (ref 6–20)
CO2: 23 mmol/L (ref 22–32)
Calcium: 9 mg/dL (ref 8.9–10.3)
Chloride: 102 mmol/L (ref 98–111)
Creatinine, Ser: 0.65 mg/dL (ref 0.44–1.00)
GFR calc Af Amer: >60 mL/min (ref >60)
GFR calc non Af Amer: >60 mL/min (ref >60)
Glucose, Bld: 134 mg/dL — ABNORMAL HIGH (ref 70–99)
Potassium: 4 mmol/L (ref 3.5–5.1)
Sodium: 134 mmol/L — ABNORMAL LOW (ref 135–145)
Total Bilirubin: 0.4 mg/dL (ref 0.3–1.2)
Total Protein: 6.8 g/dL (ref 6.5–8.1)

## 2019-03-15 MED ORDER — SODIUM CHLORIDE 0.9 % IV SOLN
175.0000 mg/m2 | Freq: Once | INTRAVENOUS | Status: AC
  Administered 2019-03-15: 324 mg via INTRAVENOUS
  Filled 2019-03-15: qty 54

## 2019-03-15 MED ORDER — SODIUM CHLORIDE 0.9 % IV SOLN
Freq: Once | INTRAVENOUS | Status: AC
  Filled 2019-03-15: qty 5

## 2019-03-15 MED ORDER — PALONOSETRON HCL INJECTION 0.25 MG/5ML
0.2500 mg | Freq: Once | INTRAVENOUS | Status: AC
  Administered 2019-03-15: 0.25 mg via INTRAVENOUS
  Filled 2019-03-15: qty 5

## 2019-03-15 MED ORDER — SODIUM CHLORIDE 0.9% FLUSH
10.0000 mL | INTRAVENOUS | Status: DC | PRN
  Administered 2019-03-15: 10 mL

## 2019-03-15 MED ORDER — SODIUM CHLORIDE 0.9 % IV SOLN
736.8000 mg | Freq: Once | INTRAVENOUS | Status: AC
  Administered 2019-03-15: 740 mg via INTRAVENOUS
  Filled 2019-03-15: qty 74

## 2019-03-15 MED ORDER — FAMOTIDINE IN NACL 20-0.9 MG/50ML-% IV SOLN
20.0000 mg | Freq: Once | INTRAVENOUS | Status: AC
  Administered 2019-03-15: 20 mg via INTRAVENOUS
  Filled 2019-03-15: qty 50

## 2019-03-15 MED ORDER — VENLAFAXINE HCL ER 150 MG PO CP24: 150.0000 mg | ORAL_CAPSULE | Freq: Every day | ORAL | 3 refills | Status: DC

## 2019-03-15 MED ORDER — SODIUM CHLORIDE 0.9 % IV SOLN: Freq: Once | INTRAVENOUS | Status: AC

## 2019-03-15 MED ORDER — HEPARIN SOD (PORK) LOCK FLUSH 100 UNIT/ML IV SOLN
500.0000 [IU] | Freq: Once | INTRAVENOUS | Status: AC | PRN
  Administered 2019-03-15: 500 [IU]

## 2019-03-15 MED ORDER — DIPHENHYDRAMINE HCL 50 MG/ML IJ SOLN
50.0000 mg | Freq: Once | INTRAMUSCULAR | Status: AC
  Administered 2019-03-15: 50 mg via INTRAVENOUS
  Filled 2019-03-15: qty 1

## 2019-03-15 MED ORDER — OXYCODONE HCL 10 MG PO TABS: 10.0000 mg | ORAL_TABLET | ORAL | 0 refills | Status: DC | PRN

## 2019-03-15 NOTE — Progress Notes (Signed)
Michelle Holt, [place] [address]   CLINIC:  Medical Oncology/Hematology  PCP:  [name], MD [place] [address] [phone]   REASON FOR VISIT:  Follow-up of high-grade serous ovarian cancer.   BRIEF ONCOLOGIC HISTORY:  Oncology History  Ovarian cancer, bilateral ([place])  07/07/2018 Pathology Results   PLEURAL FLUID, LEFT (SPECIMEN 1 OF 1, COLLECTED 07/07/18): - MALIGNANT CELLS CONSISTENT WITH ADENOCARCINOMA - SEE COMMENT  Source Pleural Fluid, (Specimen 1 of 1, collected on 07/07/2018) Gross Specimen: Received is/are 1000cc of bloody red fluid with tissue. (TC:tc) Prepared: # Smears: 0 # Concentration Technique Slides (i.e. ThinPrep): 1 # Cell Block: 1 Conventional Additional Studies: Two Hematology slides labeled [id number] Comment The malignant cells are positive for cytokeratin 7, p53, WT-1, Pax-8, Moc31, ER (weak) and EMA but negative for cytokeratin 20, TTF-1, GATA-3, CDX-2 and D2-40. Overall, the phenotype is consistent with a gynecologic primary; clinical correlation recommended.   07/07/2018 Procedure   Successful ultrasound guided left thoracentesis yielding 2.0 L of pleural fluid   07/08/2018 Procedure   1. Technically successful placement of left 14 French pigtail chest drain, placed to Pleur-evac water-seal.   07/08/2018 Procedure   1. Technically successful five [place] double lumen power injectable PICC placement   07/09/2018 Imaging   Ct chest 1. There is a moderate, loculated left hydropneumothorax with a small air component and moderate fluid component. The largest loculated component is located posteriorly. There is a pigtail drainage catheter about the lateral pleural space. There is no obvious etiology, such as obvious mass or pleural disease.   2. There is a small right pleural effusion with associated atelectasis or consolidation and a subpleural consolidation of the superior segment right lower lobe (series  4, image 56), of uncertain significance, possibly infectious or inflammatory   07/10/2018 Imaging   Ct abdomen and pelvis: 1. The bilateral ovaries are enlarged by heterogeneous appearing cystic lesions, measuring 5.3 x 4.2 cm on the right (series 4, image 72) and 4.5 x 3.2 cm on the left (series 4, image 75). Consider dedicated pelvic ultrasound and/or pelvic MRI to further evaluate for solid components given high suspicion for GYN primary malignancy.   2. No other evidence of mass and no lymphadenopathy in the abdomen or pelvis.   3. Trace ascites. There is some suggestion of omental and peritoneal nodularity (e.g. Series 4, image 37), concerning for peritoneal metastatic disease.    4. Loculated left-sided pleural effusion with left-sided pleural drainage catheter in position. Small right pleural effusion   07/13/2018 Surgery   OPERATION: 1.  Left VATS (video-assisted thoracoscopic surgery) for drainage of loculated pleural effusion. 2.  Talc pleurodesis for malignant pleural effusion. 3.  Placement of PleurX catheter for management of malignant pleural effusion. 4.  Placement of On-Q analgesia catheter system.    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   07/13/2018 Pathology Results   Pleura, peel, Left Pleural - FIBRO-FIBRINOUS PLEURITIS - NEGATIVE FOR MALIGNANCY   07/18/2018 Initial Diagnosis   Ovarian cancer, bilateral ([place])   07/20/2018 Procedure   EGD impression: Normal proximal esophagus and mid esophagus. Mild distal esophageal rings; dilation not performed because of esophagitis. LA Grade C reflux esophagitis. Z-line regular, 30 cm from the incisors. 5 cm hiatal hernia. Non-bleeding gastric ulcer with no stigmata of bleeding. Gastritis.  Duodenal erosions without bleeding. Normal  second portion of the duodenum. No specimens collected.   08/19/2018 Genetic Testing   Negative genetic testing on the common hereditary cancer panel.  The Common Hereditary Gene Panel offered by [facility] includes sequencing and/or deletion duplication testing of the following 48 genes: APC, ATM, AXIN2, BARD1, BMPR1A, BRCA1, BRCA2, BRIP1, CDH1, CDK4, CDKN2A (p14ARF), CDKN2A (p16INK4a), CHEK2, CTNNA1, DICER1, EPCAM (Deletion/duplication testing only), GREM1 (promoter region deletion/duplication testing only), KIT, MEN1, MLH1, MSH2, MSH3, MSH6, MUTYH, NBN, NF1, NHTL1, PALB2, PDGFRA, PMS2, POLD1, POLE, PTEN, RAD50, RAD51C, RAD51D, RNF43, SDHB, SDHC, SDHD, SMAD4, SMARCA4. STK11, TP53, TSC1, TSC2, and VHL.  The following genes were evaluated for sequence changes only: SDHA and HOXB13 c.251G>A variant only. The report date is August 19, 2018.   08/24/2018 -  Chemotherapy   The patient had palonosetron (ALOXI) injection 0.25 mg, 0.25 mg, Intravenous,  Once, 7 of 7 cycles Administration: 0.25 mg (08/24/2018), 0.25 mg (09/14/2018), 0.25 mg (10/05/2018), 0.25 mg (12/01/2018), 0.25 mg (01/27/2019), 0.25 mg (02/22/2019) CARBOplatin (PARAPLATIN) 740 mg in sodium chloride 0.9 % 250 mL chemo infusion, 740 mg (100 % of original dose 742.8 mg), Intravenous,  Once, 7 of 7 cycles Dose modification:   (original dose 742.8 mg, Cycle 1),   (original dose 736.8 mg, Cycle 2),   (original dose 736.8 mg, Cycle 3),   (original dose 736.8 mg, Cycle 4),   (original dose 736.8 mg, Cycle 5),   (original dose 736.8 mg, Cycle 6),   (original dose 736.8 mg, Cycle 7) Administration: 740 mg (08/24/2018), 740 mg (09/14/2018), 740 mg (10/05/2018), 740 mg (12/01/2018), 740 mg (01/27/2019), 740 mg (02/22/2019) PACLitaxel (TAXOL) 324 mg in sodium chloride 0.9 % 500 mL chemo infusion (> '80mg'$ /m2), 175 mg/m2 = 324 mg, Intravenous,  Once, 7 of 7 cycles Administration: 324 mg (08/24/2018), 324 mg (09/14/2018), 324 mg (10/05/2018), 324 mg (12/01/2018), 324 mg  (01/27/2019), 324 mg (02/22/2019) fosaprepitant (EMEND) 150 mg, dexamethasone (DECADRON) 12 mg in sodium chloride 0.9 % 145 mL IVPB, , Intravenous,  Once, 7 of 7 cycles Administration:  (08/24/2018),  (09/14/2018),  (10/05/2018),  (12/01/2018),  (01/27/2019),  (02/22/2019)  for chemotherapy treatment.       CANCER STAGING: Cancer Staging No matching staging information was found for the patient.   INTERVAL HISTORY:  Michelle Holt 49 y.o. female seen for follow-up and toxicity assessment prior to last cycle of chemotherapy.  She has experienced extreme tiredness for 9 days after last treatment.  She also felt like bee sting-like sensation in the legs which also lasted the whole 9 days.  She reported that she requires pain medication to 5 to 6 tablets a day in the first week after treatment.  After that he usually requires about 4 pills/day.  She requests refill.  She is continuing to have hot flashes which are not completely controlled with Effexor 75 mg daily.  REVIEW OF SYSTEMS:  Review of Systems  Endocrine: Positive for hot flashes.  Neurological: Positive for numbness.  Psychiatric/Behavioral: Positive for sleep disturbance. The patient is nervous/anxious.   All other systems reviewed and are negative.    PAST MEDICAL/SURGICAL HISTORY:  Past Medical History:  Diagnosis Date  . Anxiety and depression   . Arthritis of facet joints at multiple vertebral levels    L5-S1  . Constipation   . Dyslipidemia   . Family history of breast cancer   . Family history of uterine cancer   . History of kidney stones   . Insomnia   .  Irritable bowel syndrome   . Migraine   . Muscle tension headache   . Neuropathy of finger   . Ovarian carcinoma ([place])   . Plantar fasciitis of right foot   . Port-A-Cath in place 08/20/2018   Past Surgical History:  Procedure Laterality Date  . CHOLECYSTECTOMY  2008  . COLONOSCOPY N/A 08/13/2013   Procedure: COLONOSCOPY;  Surgeon: Rogene Houston, MD;  [facility];  Service: Endoscopy;  Laterality: N/A;  230-moved to 145 Ann to notify pt  . ESOPHAGOGASTRODUODENOSCOPY    . ESOPHAGOGASTRODUODENOSCOPY (EGD) WITH PROPOFOL N/A 07/20/2018   Procedure: ESOPHAGOGASTRODUODENOSCOPY (EGD) WITH PROPOFOL;  Surgeon: Rogene Houston, MD;  [facility];  Service: Endoscopy;  Laterality: N/A;  Possible esophageal dilation.  . IR ANGIOGRAM SELECTIVE EACH ADDITIONAL VESSEL  08/01/2018  . IR ANGIOGRAM VISCERAL SELECTIVE  08/01/2018  . IR EMBO ART  VEN HEMORR LYMPH EXTRAV  INC GUIDE ROADMAPPING  08/01/2018  . IR IMAGING GUIDED PORT INSERTION  08/20/2018  . IR PERC PLEURAL DRAIN W/INDWELL CATH W/IMG GUIDE  07/08/2018  . IR THORACENTESIS ASP PLEURAL SPACE W/IMG GUIDE  07/07/2018  . IR US GUIDE VASC ACCESS RIGHT  08/01/2018  . PLEURAL EFFUSION DRAINAGE Left 07/13/2018   Procedure: DRAINAGE OF LOCULATED PLEURAL EFFUSION;  Surgeon: Ivin Poot, MD;  Location: [facility];  Service: Thoracic;  Laterality: Left;  . REMOVAL OF PLEURAL DRAINAGE CATHETER Left 08/20/2018   Procedure: REMOVAL OF PLEURAL DRAINAGE CATHETER;  Surgeon: Ivin Poot, MD;  Location: [facility];  Service: Thoracic;  Laterality: Left;  . REMOVAL OF PLEURAL DRAINAGE CATHETER Left 08/20/2018   Procedure: REMOVAL OF PLEURAL DRAINAGE CATHETER;  Surgeon: Ivin Poot, MD;  Location: [place];  Service: Thoracic;  Laterality: Left;  . TALC PLEURODESIS Left 07/13/2018   Procedure: Talc Pleuradesis;  Surgeon: Prescott Gum, Collier Salina, MD;  Location: [facility];  Service: Thoracic;  Laterality: Left;  . TUBAL LIGATION Bilateral   . UTERINE ABLATION    . VIDEO ASSISTED THORACOSCOPY Left 07/13/2018   Procedure: VIDEO ASSISTED THORACOSCOPY;  Surgeon: Ivin Poot, MD;  Location: [facility] OR;  Service: Thoracic;  Laterality: Left;     SOCIAL HISTORY:  Social History   Socioeconomic History  . Marital status: Widowed    Spouse name: Not on file  . Number of children: 2  . Years of education: 2-College  . Highest education  level: Not on file  Occupational History    Employer: [employer]  Tobacco Use  . Smoking status: Former Smoker    Packs/day: 0.50    Years: 17.00    Pack years: 8.50    Types: Cigarettes    Quit date: 06/22/2018    Years since quitting: 0.7  . Smokeless tobacco: Never Used  Substance and Sexual Activity  . Alcohol use: Yes    Alcohol/week: 1.0 standard drinks    Types: 1 Glasses of wine per week    Comment: Drinks alcohol on occasion  . Drug use: No  . Sexual activity: Not on file  Other Topics Concern  . Not on file  Social History Narrative   Patient lives at home with her daughter.    Patient has 2 children.    Patient is widowed.    Patient is right handed.    Patient has her Associates degree.      Social Determinants of Health   Financial Resource Strain: Low Risk   . Difficulty of Paying Living Expenses: Not hard at all  Food Insecurity: No Food Insecurity  . Worried About Charity fundraiser in the Last Year: Never true  . Ran Out of Food in the Last Year: Never true  Transportation Needs: No Transportation Needs  . Lack of Transportation (Medical): No  . Lack of Transportation (Non-Medical): No  Physical Activity: Inactive  . Days of Exercise per Week: 0 days  . Minutes of Exercise per Session: 0 min  Stress: Stress Concern Present  . Feeling of Stress : Very much  Social Connections: Somewhat Isolated  . Frequency of Communication with Friends and Family: More than three times a week  . Frequency of Social Gatherings with Friends and Family: More than three times a week  . Attends Religious Services: 1 to 4 times per year  . Active Member of Clubs or Organizations: No  . Attends Archivist Meetings: Never  . Marital Status: Widowed  Intimate Partner Violence: Not At Risk  . Fear of Current or Ex-Partner: No  . Emotionally Abused: No  . Physically Abused: No  . Sexually Abused: No    FAMILY HISTORY:  Family History  Problem Relation Age of  Onset  . Hypertension Mother   . Obesity Mother   . Diabetes Mother   . Kidney disease Mother   . Peripheral vascular disease Father   . Atrial fibrillation Father   . COPD Brother   . Osteoporosis Brother   . Crohn's disease Sister   . Uterine cancer Sister 48       maternal half sister  . Breast cancer Maternal Aunt 78  . Colon cancer Neg Hx     CURRENT MEDICATIONS:  Outpatient Encounter Medications as of 03/15/2019  Medication Sig Note  . Ascorbic Acid (VITAMIN C) 1000 MG tablet Take 1,000 mg by mouth every other day.   . bisacodyl (DULCOLAX) 5 MG EC tablet Take 5 mg by mouth 2 (two) times daily.   Marland Kitchen CARBOPLATIN IV Inject into the vein every 21 ( twenty-one) days.   . Cholecalciferol (VITAMIN D) 50 MCG (2000 UT) CAPS Take 2,000 Units by mouth daily.   . diazepam (VALIUM) 5 MG tablet Take 1 tablet (5 mg total) by mouth at bedtime.   . gabapentin (NEURONTIN) 300 MG capsule Take 300 mg by mouth every evening.   . magnesium oxide (MAG-OX) 400 MG tablet Take 400 mg by mouth every other day.   Marland Kitchen PACLITAXEL IV Inject into the vein every 21 ( twenty-one) days.   . pantoprazole (PROTONIX) 40 MG tablet TAKE ONE TABLET BY MOUTH TWICE DAILY. TAKE BEFORE A MEAL.   Marland Kitchen venlafaxine XR (EFFEXOR-XR) 150 MG 24 hr capsule Take 1 capsule (150 mg total) by mouth daily with breakfast.   . [DISCONTINUED] venlafaxine XR (EFFEXOR-XR) 75 MG 24 hr capsule Take 1 capsule (75 mg total) by mouth daily with breakfast.   . acetaminophen (TYLENOL) 500 MG tablet Take 1,000 mg by mouth every 6 (six) hours as needed for moderate pain.   Marland Kitchen dexamethasone (DECADRON) 4 MG tablet TAKE ONE TABLET BY MOUTH DAILY. (Patient not taking: Reported on 02/22/2019)   . dicyclomine (BENTYL) 10 MG capsule Take 1 capsule (10 mg total) by mouth 4 (four) times daily -  before meals and at bedtime. (Patient not taking: Reported on 01/27/2019) 12/22/2018: Typically during chemo  . lidocaine-prilocaine (EMLA) cream Apply small amount to port a  cath site and cover with plastic wrap one hour prior to chemotherapy appointments (Patient not taking: Reported on 01/27/2019)   .  ondansetron (ZOFRAN-ODT) 8 MG disintegrating tablet TAKE ONE TABLET BY MOUTH EVERY 8 HOURS AS NEEDED FOR NAUSEA AND VOMITING. (Patient not taking: Reported on 01/27/2019)   . Oxycodone HCl 10 MG TABS Take 1 tablet (10 mg total) by mouth every 4 (four) hours as needed.   . polyethylene glycol (MIRALAX / GLYCOLAX) 17 g packet Take 17 g by mouth daily as needed for moderate constipation.    . senna (SENOKOT) 8.6 MG TABS tablet Take 1 tablet (8.6 mg total) by mouth daily as needed for mild constipation or moderate constipation. (Patient not taking: Reported on 03/15/2019)   . SUMAtriptan (IMITREX) 100 MG tablet TAKE ONE TABLET BY MOUTH PRN UP to TWICE DAILY AS NEEDED. (Patient not taking: Reported on 01/27/2019)   . [DISCONTINUED] Oxycodone HCl 10 MG TABS Take 1 tablet (10 mg total) by mouth every 4 (four) hours as needed. (Patient not taking: Reported on 02/22/2019)    No facility-administered encounter medications on file as of 03/15/2019.    ALLERGIES:  Allergies  Allergen Reactions  . Morphine And Related Itching  . Nortriptyline Other (See Comments)    Significant weight gain  . Topamax [Topiramate] Diarrhea and Nausea Only  . Actifed Cold-Allergy [Chlorpheniramine-Phenyleph Er] Rash  . Amoxicillin Rash    Did it involve swelling of the face/tongue/throat, SOB, or low BP? Unknown Did it involve sudden or severe rash/hives, skin peeling, or any reaction on the inside of your mouth or nose? Unknown Did you need to seek medical attention at a hospital or doctor's office? Unknown When did it last happen? teenager If all above answers are "NO", may proceed with cephalosporin use.   . Codeine Hives  . Erythromycin Rash  . Penicillins Rash    Did it involve swelling of the face/tongue/throat, SOB, or low BP? Unknown Did it involve sudden or severe rash/hives, skin  peeling, or any reaction on the inside of your mouth or nose? Unknown Did you need to seek medical attention at a hospital or doctor's office? Unknown When did it last happen? teenager If all above answers are "NO", may proceed with cephalosporin use.   [name] [Pseudoephedrine Hcl] Rash     PHYSICAL EXAM:  ECOG Performance status: 1  Vitals:   03/15/19 0824  BP: 113/68  Pulse: 86  Resp: 17  Temp: (!) 97.1 F (36.2 C)  SpO2: 99%   Filed Weights   03/15/19 0824  Weight: 172 lb 3.2 oz (78.1 kg)    Physical Exam Vitals reviewed.  Constitutional:      Appearance: Normal appearance.  Cardiovascular:     Rate and Rhythm: Normal rate and regular rhythm.     Heart sounds: Normal heart sounds.  Pulmonary:     Effort: Pulmonary effort is normal.     Breath sounds: Normal breath sounds.  Abdominal:     General: There is no distension.     Palpations: Abdomen is soft. There is no mass.  Skin:    General: Skin is warm.  Neurological:     General: No focal deficit present.     Mental Status: She is alert and oriented to person, place, and time.  Psychiatric:        Mood and Affect: Mood normal.        Behavior: Behavior normal.      LABORATORY DATA:  I have reviewed the labs as listed.  CBC    Component Value Date/Time   WBC 7.2 03/15/2019 0822   RBC 3.13 (L)  03/15/2019 0822   HGB 11.6 (L) 03/15/2019 0822   HCT 35.9 (L) 03/15/2019 0822   PLT 148 (L) 03/15/2019 0822   MCV 114.7 (H) 03/15/2019 0822   MCH 37.1 (H) 03/15/2019 0822   MCHC 32.3 03/15/2019 0822   RDW 14.1 03/15/2019 0822   LYMPHSABS 2.2 03/15/2019 0822   MONOABS 0.5 03/15/2019 0822   EOSABS 0.1 03/15/2019 0822   BASOSABS 0.0 03/15/2019 0822   CMP Latest Ref Rng & Units 03/15/2019 02/22/2019 01/27/2019  Glucose 70 - 99 mg/dL 134(H) 105(H) 105(H)  BUN 6 - 20 mg/dL _0 Creatinine 0.44 - 1.00 mg/dL 0.65 0.65 0.59  Sodium 135 - 145 mmol/L 134(L) 140 140  Potassium 3.5 - 5.1 mmol/L 4.0 4.6 3.7   Chloride 98 - 111 mmol/L 102 103 103  CO2 22 - 32 mmol/L _1 Calcium 8.9 - 10.3 mg/dL 9.0 9.6 9.7  Total Protein 6.5 - 8.1 g/dL 6.8 6.8 6.6  Total Bilirubin 0.3 - 1.2 mg/dL 0.4 0.8 0.7  Alkaline Phos 38 - 126 U/L 97 101 89  AST 15 - 41 U/L _2 ALT 0 - 44 U/L _3 DIAGNOSTIC IMAGING:  I have independently reviewed the scans and discussed with patient.   I have reviewed Venita Lick LPN's note and agree with the documentation.  I personally performed a face-to-face visit, made revisions and my assessment and plan is as follows.    ASSESSMENT & PLAN:   Ovarian cancer, bilateral ([place]) 1.  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 -Germline mutation testing is negative. -Last CT abdomen and pelvis on 12/21/2018 showed continuous decrease in the tumor with no new areas. -She underwent robotic assisted laparoscopic TAH, BSO, omentectomy on 12/29/2018, pathology showing high-grade serous carcinoma, PT3P NX. -Last cycle of chemotherapy was on 02/22/2019.  She felt tired for 9 days. -I have reviewed her labs today.  White 27.2, platelet count is 148.  Creatinine is normal.  LFTs are normal. -She may proceed with her last cycle of chemotherapy today. -I will reevaluate her in 1 month.  I plan to repeat CT of the abdomen and pelvis and CA-125 level prior to next visit.  2.  Low back pain: -She is taking oxycodone up to 6 tablets during the first week after treatment.  After that she will take 4 times a day. -She also has occasional epigastric and left upper quadrant pain.  3.  Peripheral neuropathy: -She is taking gabapentin 300 mg at bedtime.  She could not tolerate 3 times a day dose due to drowsiness. -She has experienced some pins-and-needles sensation in the legs which lasted about 9 days after last cycle of chemotherapy.  4.  Hot  flashes: -She is taking venlafaxine 75 mg daily. -She is still having some hot flashes.  I will increase venlafaxine to 150 mg daily.   Orders placed this encounter:  Orders Placed This Encounter  Procedures  . CT Abdomen Pelvis W Contrast  . CBC with Differential/Platelet  . Comprehensive metabolic panel  . Magnesium  . CA [place], MD [place] [phone]  \

## 2019-03-15 NOTE — Patient Instructions (Signed)
[facility] Discharge Instructions for Patients Receiving Chemotherapy  Today you received the following chemotherapy agents   To help prevent nausea and vomiting after your treatment, we encourage you to take your nausea medication   If you develop nausea and vomiting that is not controlled by your nausea medication, call the clinic.   BELOW ARE SYMPTOMS THAT SHOULD BE REPORTED IMMEDIATELY:  *FEVER GREATER THAN 100.5 F  *CHILLS WITH OR WITHOUT FEVER  NAUSEA AND VOMITING THAT IS NOT CONTROLLED WITH YOUR NAUSEA MEDICATION  *UNUSUAL SHORTNESS OF BREATH  *UNUSUAL BRUISING OR BLEEDING  TENDERNESS IN MOUTH AND THROAT WITH OR WITHOUT PRESENCE OF ULCERS  *URINARY PROBLEMS  *BOWEL PROBLEMS  UNUSUAL RASH Items with * indicate a potential emergency and should be followed up as soon as possible.  Feel free to call the clinic should you have any questions or concerns. The clinic phone number is [phone].  Please show the CHEMO ALERT CARD at check-in to the Emergency Department and triage nurse.   

## 2019-03-15 NOTE — Progress Notes (Signed)
Labs reviewed at office visit today. Proceed with treatment as planned.   Treatment given per orders. Patient tolerated it well without problems. Vitals stable and discharged home from clinic ambulatory. Follow up as scheduled.

## 2019-03-15 NOTE — Progress Notes (Signed)
Patient has been assessed, vital signs and labs have been reviewed by Dr. Katragadda. ANC, Creatinine, LFTs, and Platelets are within treatment parameters per Dr. Katragadda. The patient is good to proceed with treatment at this time.  

## 2019-03-15 NOTE — Patient Instructions (Addendum)
[place] at [facility] Discharge Instructions  You were seen today by Dr. Delton Coombes. He went over your recent lab results. He will see you back in 1 month for labs and follow up.  START taking 2 tablets of Effexor daily.  Thank you for choosing [place] at [facility] to provide your oncology and hematology care.  To afford each patient quality time with our provider, please arrive at least 15 minutes before your scheduled appointment time.   If you have a lab appointment with the [facility] please come in thru the  Main Entrance and check in at the main information desk  You need to re-schedule your appointment should you arrive 10 or more minutes late.  We strive to give you quality time with our providers, and arriving late affects you and other patients whose appointments are after yours.  Also, if you no show three or more times for appointments you may be dismissed from the clinic at the providers discretion.     Again, thank you for choosing [facility].  Our hope is that these requests will decrease the amount of time that you wait before being seen by our physicians.       _____________________________________________________________  Should you have questions after your visit to [facility], please contact our office at [phone] between the hours of 8:00 a.m. and 4:30 p.m.  Voicemails left after 4:00 p.m. will not be returned until the following business day.  For prescription refill requests, have your pharmacy contact our office and allow 72 hours.    Cancer Center Support Programs:   > Cancer Support Group  2nd Tuesday of the month 1pm-2pm, Journey Room

## 2019-03-15 NOTE — Assessment & Plan Note (Signed)
1.  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 -Germline mutation testing is negative. -Last CT abdomen and pelvis on 12/21/2018 showed continuous decrease in the tumor with no new areas. -She underwent robotic assisted laparoscopic TAH, BSO, omentectomy on 12/29/2018, pathology showing high-grade serous carcinoma, PT3P NX. -Last cycle of chemotherapy was on 02/22/2019.  She felt tired for 9 days. -I have reviewed her labs today.  White 27.2, platelet count is 148.  Creatinine is normal.  LFTs are normal. -She may proceed with her last cycle of chemotherapy today. -I will reevaluate her in 1 month.  I plan to repeat CT of the abdomen and pelvis and CA-125 level prior to next visit.  2.  Low back pain: -She is taking oxycodone up to 6 tablets during the first week after treatment.  After that she will take 4 times a day. -She also has occasional epigastric and left upper quadrant pain.  3.  Peripheral neuropathy: -She is taking gabapentin 300 mg at bedtime.  She could not tolerate 3 times a day dose due to drowsiness. -She has experienced some pins-and-needles sensation in the legs which lasted about 9 days after last cycle of chemotherapy.  4.  Hot flashes: -She is taking venlafaxine 75 mg daily. -She is still having some hot flashes.  I will increase venlafaxine to 150 mg daily.

## 2019-03-16 MED ORDER — ALPRAZOLAM 0.5 MG PO TABS: 0.5000 mg | ORAL_TABLET | Freq: Every evening | ORAL | 0 refills | Status: DC | PRN

## 2019-03-16 NOTE — Addendum Note (Signed)
Addended by: Derek Jack on: 03/16/2019 07:56 AM   Modules accepted: Orders

## 2019-03-24 ENCOUNTER — Other Ambulatory Visit (HOSPITAL_COMMUNITY): Payer: Self-pay | Admitting: Hematology

## 2019-03-26 ENCOUNTER — Other Ambulatory Visit (HOSPITAL_COMMUNITY): Payer: Self-pay | Admitting: Hematology

## 2019-04-08 ENCOUNTER — Other Ambulatory Visit (HOSPITAL_COMMUNITY): Payer: Self-pay | Admitting: *Deleted

## 2019-04-08 ENCOUNTER — Other Ambulatory Visit (HOSPITAL_COMMUNITY): Payer: Self-pay | Admitting: Hematology

## 2019-04-08 DIAGNOSIS — C563 Malignant neoplasm of bilateral ovaries: Secondary | ICD-10-CM

## 2019-04-08 DIAGNOSIS — C561 Malignant neoplasm of right ovary: Secondary | ICD-10-CM

## 2019-04-08 MED ORDER — OXYCODONE HCL 10 MG PO TABS: 10.0000 mg | ORAL_TABLET | ORAL | 0 refills | Status: DC | PRN

## 2019-04-12 ENCOUNTER — Other Ambulatory Visit (HOSPITAL_COMMUNITY): Payer: Self-pay | Admitting: *Deleted

## 2019-04-12 DIAGNOSIS — C563 Malignant neoplasm of bilateral ovaries: Secondary | ICD-10-CM

## 2019-04-12 DIAGNOSIS — C561 Malignant neoplasm of right ovary: Secondary | ICD-10-CM

## 2019-04-13 ENCOUNTER — Inpatient Hospital Stay (HOSPITAL_COMMUNITY): Payer: Medicaid Other

## 2019-04-13 ENCOUNTER — Other Ambulatory Visit: Payer: Self-pay

## 2019-04-13 ENCOUNTER — Ambulatory Visit (HOSPITAL_COMMUNITY)
Admission: RE | Admit: 2019-04-13 | Discharge: 2019-04-13 | Disposition: A | Discharge status: Home / Self Care | Payer: Medicaid Other | Source: Ambulatory Visit | Attending: Hematology | Admitting: Hematology

## 2019-04-13 DIAGNOSIS — C561 Malignant neoplasm of right ovary: Secondary | ICD-10-CM | POA: Insufficient documentation

## 2019-04-13 DIAGNOSIS — C563 Malignant neoplasm of bilateral ovaries: Secondary | ICD-10-CM

## 2019-04-13 DIAGNOSIS — C562 Malignant neoplasm of left ovary: Secondary | ICD-10-CM | POA: Diagnosis present

## 2019-04-13 DIAGNOSIS — Z5111 Encounter for antineoplastic chemotherapy: Secondary | ICD-10-CM | POA: Diagnosis not present

## 2019-04-13 LAB — CBC WITH DIFFERENTIAL/PLATELET
Abs Immature Granulocytes: 0.01 10*3/uL (ref 0.00–0.07)
Basophils Absolute: 0 10*3/uL (ref 0.0–0.1)
Basophils Relative: 0 %
Eosinophils Absolute: 0.1 10*3/uL (ref 0.0–0.5)
Eosinophils Relative: 2 %
HCT: 32.6 % — ABNORMAL LOW (ref 36.0–46.0)
Hemoglobin: 10.6 g/dL — ABNORMAL LOW (ref 12.0–15.0)
Immature Granulocytes: 0 %
Lymphocytes Relative: 27 %
Lymphs Abs: 1.7 10*3/uL (ref 0.7–4.0)
MCH: 38.8 pg — ABNORMAL HIGH (ref 26.0–34.0)
MCHC: 32.5 g/dL (ref 30.0–36.0)
MCV: 119.4 fL — ABNORMAL HIGH (ref 80.0–100.0)
Monocytes Absolute: 0.5 10*3/uL (ref 0.1–1.0)
Monocytes Relative: 8 %
Neutro Abs: 4.1 10*3/uL (ref 1.7–7.7)
Neutrophils Relative %: 63 %
Platelets: 258 10*3/uL (ref 150–400)
RBC: 2.73 MIL/uL — ABNORMAL LOW (ref 3.87–5.11)
RDW: 18.1 % — ABNORMAL HIGH (ref 11.5–15.5)
WBC: 6.4 10*3/uL (ref 4.0–10.5)
nRBC: 0 % (ref 0.0–0.2)

## 2019-04-13 LAB — COMPREHENSIVE METABOLIC PANEL
ALT: 25 U/L (ref 0–44)
AST: 24 U/L (ref 15–41)
Albumin: 4 g/dL (ref 3.5–5.0)
Alkaline Phosphatase: 79 U/L (ref 38–126)
Anion gap: 9 (ref 5–15)
BUN: 11 mg/dL (ref 6–20)
CO2: 26 mmol/L (ref 22–32)
Calcium: 9.5 mg/dL (ref 8.9–10.3)
Chloride: 102 mmol/L (ref 98–111)
Creatinine, Ser: 0.63 mg/dL (ref 0.44–1.00)
GFR calc Af Amer: >60 mL/min (ref >60)
GFR calc non Af Amer: >60 mL/min (ref >60)
Glucose, Bld: 103 mg/dL — ABNORMAL HIGH (ref 70–99)
Potassium: 4 mmol/L (ref 3.5–5.1)
Sodium: 137 mmol/L (ref 135–145)
Total Bilirubin: 0.6 mg/dL (ref 0.3–1.2)
Total Protein: 6.7 g/dL (ref 6.5–8.1)

## 2019-04-13 LAB — MAGNESIUM: Magnesium: 1.6 mg/dL — ABNORMAL LOW (ref 1.7–2.4)

## 2019-04-13 MED ORDER — HEPARIN SOD (PORK) LOCK FLUSH 100 UNIT/ML IV SOLN
INTRAVENOUS | Status: AC
  Administered 2019-04-13: 500 [IU]
  Filled 2019-04-13: qty 5

## 2019-04-13 MED ORDER — IOHEXOL 300 MG/ML  SOLN
100.0000 mL | Freq: Once | INTRAMUSCULAR | Status: AC | PRN
  Administered 2019-04-13: 100 mL via INTRAVENOUS

## 2019-04-13 MED ORDER — IOHEXOL 9 MG/ML PO SOLN
ORAL | Status: AC
  Filled 2019-04-13: qty 1000

## 2019-04-14 LAB — CA 125: Cancer Antigen (CA) 125: 7.8 U/mL (ref 0.0–38.1)

## 2019-04-15 ENCOUNTER — Ambulatory Visit (HOSPITAL_COMMUNITY): Payer: Medicaid Other

## 2019-04-15 ENCOUNTER — Other Ambulatory Visit (HOSPITAL_COMMUNITY): Payer: Medicaid Other

## 2019-04-16 ENCOUNTER — Other Ambulatory Visit (HOSPITAL_COMMUNITY): Payer: Self-pay | Admitting: Hematology

## 2019-04-19 ENCOUNTER — Encounter (HOSPITAL_COMMUNITY): Payer: Self-pay | Admitting: Hematology

## 2019-04-19 ENCOUNTER — Inpatient Hospital Stay (HOSPITAL_COMMUNITY): Payer: Medicaid Other | Attending: Hematology | Admitting: Hematology

## 2019-04-19 VITALS — BP 115/50 | HR 80 | Temp 97.3°F | Resp 16 | Wt 166.0 lb

## 2019-04-19 DIAGNOSIS — F329 Major depressive disorder, single episode, unspecified: Secondary | ICD-10-CM | POA: Insufficient documentation

## 2019-04-19 DIAGNOSIS — Z79899 Other long term (current) drug therapy: Secondary | ICD-10-CM | POA: Insufficient documentation

## 2019-04-19 DIAGNOSIS — Z87891 Personal history of nicotine dependence: Secondary | ICD-10-CM | POA: Insufficient documentation

## 2019-04-19 DIAGNOSIS — Z833 Family history of diabetes mellitus: Secondary | ICD-10-CM | POA: Diagnosis not present

## 2019-04-19 DIAGNOSIS — Z90710 Acquired absence of both cervix and uterus: Secondary | ICD-10-CM | POA: Diagnosis not present

## 2019-04-19 DIAGNOSIS — C562 Malignant neoplasm of left ovary: Secondary | ICD-10-CM | POA: Insufficient documentation

## 2019-04-19 DIAGNOSIS — E785 Hyperlipidemia, unspecified: Secondary | ICD-10-CM | POA: Insufficient documentation

## 2019-04-19 DIAGNOSIS — Z7952 Long term (current) use of systemic steroids: Secondary | ICD-10-CM | POA: Diagnosis not present

## 2019-04-19 DIAGNOSIS — R1012 Left upper quadrant pain: Secondary | ICD-10-CM | POA: Insufficient documentation

## 2019-04-19 DIAGNOSIS — Z8262 Family history of osteoporosis: Secondary | ICD-10-CM | POA: Diagnosis not present

## 2019-04-19 DIAGNOSIS — F419 Anxiety disorder, unspecified: Secondary | ICD-10-CM | POA: Diagnosis not present

## 2019-04-19 DIAGNOSIS — Z9221 Personal history of antineoplastic chemotherapy: Secondary | ICD-10-CM | POA: Diagnosis not present

## 2019-04-19 DIAGNOSIS — Z9079 Acquired absence of other genital organ(s): Secondary | ICD-10-CM | POA: Insufficient documentation

## 2019-04-19 DIAGNOSIS — Z803 Family history of malignant neoplasm of breast: Secondary | ICD-10-CM | POA: Insufficient documentation

## 2019-04-19 DIAGNOSIS — Z8049 Family history of malignant neoplasm of other genital organs: Secondary | ICD-10-CM | POA: Diagnosis not present

## 2019-04-19 DIAGNOSIS — C561 Malignant neoplasm of right ovary: Secondary | ICD-10-CM | POA: Insufficient documentation

## 2019-04-19 DIAGNOSIS — G629 Polyneuropathy, unspecified: Secondary | ICD-10-CM | POA: Diagnosis not present

## 2019-04-19 DIAGNOSIS — Z8249 Family history of ischemic heart disease and other diseases of the circulatory system: Secondary | ICD-10-CM | POA: Diagnosis not present

## 2019-04-19 DIAGNOSIS — C563 Malignant neoplasm of bilateral ovaries: Secondary | ICD-10-CM

## 2019-04-19 DIAGNOSIS — Z90722 Acquired absence of ovaries, bilateral: Secondary | ICD-10-CM | POA: Diagnosis not present

## 2019-04-19 DIAGNOSIS — Z8349 Family history of other endocrine, nutritional and metabolic diseases: Secondary | ICD-10-CM | POA: Insufficient documentation

## 2019-04-19 NOTE — Assessment & Plan Note (Addendum)
1.  Clinical stage IVa high-grade serous ovarian cancer, positive cytology of left pleural effusion: -4 cycles of carboplatin and paclitaxel from 08/24/2018 through 12/01/2018 -Robotic assisted laparoscopic TAH and BSO and omentectomy on 12/29/2018, pathology showing high-grade serous carcinoma, PT3P NX -Germline mutation testing was negative. -3 more cycles of chemotherapy completed on 03/15/2019. -We reviewed CT abdomen and pelvis dated 04/13/2019.  This showed no findings of active malignancy.  28% reduction in volume of the presumed chronic hematoma/chronic fluid collection in the spleen. -CA-125 level was normal. -We discussed the surveillance plan.  I plan to see her back in 2 months for follow-up.  We'll repeat CA-125 level at that time.  I plan to repeat CT scan in 3 to 4 months.  2.  Generalized pain/left upper quadrant pain: -She is taking oxycodone up to 6 tablets/day which is helping control the pain. -She had slight worsening of pain as she moved into a new house.  3.  Peripheral neuropathy: -She is taking gabapentin 300 mg at bedtime.  She could not tolerate 3 times a day dose due to drowsiness.  4.  Hot flashes: -She is taking venlafaxine 150 mg daily.

## 2019-04-19 NOTE — Patient Instructions (Addendum)
[facility] at [facility] Discharge Instructions  You were seen today by Dr. Katragadda. He went over your recent lab and scan results. He will see you back in 2 months for labs and follow up.   Thank you for choosing  [facility] Center at [facility] to provide your oncology and hematology care.  To afford each patient quality time with our provider, please arrive at least 15 minutes before your scheduled appointment time.   If you have a lab appointment with the Cancer Center please come in thru the  Main Entrance and check in at the main information desk  You need to re-schedule your appointment should you arrive 10 or more minutes late.  We strive to give you quality time with our providers, and arriving late affects you and other patients whose appointments are after yours.  Also, if you no show three or more times for appointments you may be dismissed from the clinic at the providers discretion.     Again, thank you for choosing [facility].  Our hope is that these requests will decrease the amount of time that you wait before being seen by our physicians.       _____________________________________________________________  Should you have questions after your visit to [facility], please contact our office at [phone] between the hours of 8:00 a.m. and 4:30 p.m.  Voicemails left after 4:00 p.m. will not be returned until the following business day.  For prescription refill requests, have your pharmacy contact our office and allow 72 hours.    Cancer Center Support Programs:   > Cancer Support Group  2nd Tuesday of the month 1pm-2pm, Journey Room    

## 2019-04-19 NOTE — Progress Notes (Signed)
[place] [place], [place] [address]   CLINIC:  Medical Oncology/Hematology  PCP:  [name], MD [place] [address] [phone]   REASON FOR VISIT:  Follow-up of high-grade serous ovarian cancer.   BRIEF ONCOLOGIC HISTORY:  Oncology History  Ovarian cancer, bilateral ([place])  07/07/2018 Pathology Results   PLEURAL FLUID, LEFT (SPECIMEN 1 OF 1, COLLECTED 07/07/18): - MALIGNANT CELLS CONSISTENT WITH ADENOCARCINOMA - SEE COMMENT  Source Pleural Fluid, (Specimen 1 of 1, collected on 07/07/2018) Gross Specimen: Received is/are 1000cc of bloody red fluid with tissue. (TC:tc) Prepared: # Smears: 0 # Concentration Technique Slides (i.e. ThinPrep): 1 # Cell Block: 1 Conventional Additional Studies: Two Hematology slides labeled [id number] Comment The malignant cells are positive for cytokeratin 7, p53, WT-1, Pax-8, Moc31, ER (weak) and EMA but negative for cytokeratin 20, TTF-1, GATA-3, CDX-2 and D2-40. Overall, the phenotype is consistent with a gynecologic primary; clinical correlation recommended.   07/07/2018 Procedure   Successful ultrasound guided left thoracentesis yielding 2.0 L of pleural fluid   07/08/2018 Procedure   1. Technically successful placement of left 14 French pigtail chest drain, placed to Pleur-evac water-seal.   07/08/2018 Procedure   1. Technically successful five [place] double lumen power injectable PICC placement   07/09/2018 Imaging   Ct chest 1. There is a moderate, loculated left hydropneumothorax with a small air component and moderate fluid component. The largest loculated component is located posteriorly. There is a pigtail drainage catheter about the lateral pleural space. There is no obvious etiology, such as obvious mass or pleural disease.   2. There is a small right pleural effusion with associated atelectasis or consolidation and a subpleural consolidation of the superior segment right lower lobe (series  4, image 56), of uncertain significance, possibly infectious or inflammatory   07/10/2018 Imaging   Ct abdomen and pelvis: 1. The bilateral ovaries are enlarged by heterogeneous appearing cystic lesions, measuring 5.3 x 4.2 cm on the right (series 4, image 72) and 4.5 x 3.2 cm on the left (series 4, image 75). Consider dedicated pelvic ultrasound and/or pelvic MRI to further evaluate for solid components given high suspicion for GYN primary malignancy.   2. No other evidence of mass and no lymphadenopathy in the abdomen or pelvis.   3. Trace ascites. There is some suggestion of omental and peritoneal nodularity (e.g. Series 4, image 2), concerning for peritoneal metastatic disease.    4. Loculated left-sided pleural effusion with left-sided pleural drainage catheter in position. Small right pleural effusion   07/13/2018 Surgery   OPERATION: 1.  Left VATS (video-assisted thoracoscopic surgery) for drainage of loculated pleural effusion. 2.  Talc pleurodesis for malignant pleural effusion. 3.  Placement of PleurX catheter for management of malignant pleural effusion. 4.  Placement of On-Q analgesia catheter system.    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   07/13/2018 Pathology Results   Pleura, peel, Left Pleural - FIBRO-FIBRINOUS PLEURITIS - NEGATIVE FOR MALIGNANCY   07/18/2018 Initial Diagnosis   Ovarian cancer, bilateral ([place])   07/20/2018 Procedure   EGD impression: Normal proximal esophagus and mid esophagus. Mild distal esophageal rings; dilation not performed because of esophagitis. LA Grade C reflux esophagitis. Z-line regular, 30 cm from the incisors. 5 cm hiatal hernia. Non-bleeding gastric ulcer with no stigmata of bleeding. Gastritis.  Duodenal erosions without bleeding. Normal  second portion of the duodenum. No specimens collected.   08/19/2018 Genetic Testing   Negative genetic testing on the common hereditary cancer panel.  The Common Hereditary Gene Panel offered by [facility] includes sequencing and/or deletion duplication testing of the following 48 genes: APC, ATM, AXIN2, BARD1, BMPR1A, BRCA1, BRCA2, BRIP1, CDH1, CDK4, CDKN2A (p14ARF), CDKN2A (p16INK4a), CHEK2, CTNNA1, DICER1, EPCAM (Deletion/duplication testing only), GREM1 (promoter region deletion/duplication testing only), KIT, MEN1, MLH1, MSH2, MSH3, MSH6, MUTYH, NBN, NF1, NHTL1, PALB2, PDGFRA, PMS2, POLD1, POLE, PTEN, RAD50, RAD51C, RAD51D, RNF43, SDHB, SDHC, SDHD, SMAD4, SMARCA4. STK11, TP53, TSC1, TSC2, and VHL.  The following genes were evaluated for sequence changes only: SDHA and HOXB13 c.251G>A variant only. The report date is August 19, 2018.   08/24/2018 -  Chemotherapy   The patient had palonosetron (ALOXI) injection 0.25 mg, 0.25 mg, Intravenous,  Once, 7 of 7 cycles Administration: 0.25 mg (08/24/2018), 0.25 mg (09/14/2018), 0.25 mg (10/05/2018), 0.25 mg (12/01/2018), 0.25 mg (01/27/2019), 0.25 mg (02/22/2019), 0.25 mg (03/15/2019) CARBOplatin (PARAPLATIN) 740 mg in sodium chloride 0.9 % 250 mL chemo infusion, 740 mg (100 % of original dose 742.8 mg), Intravenous,  Once, 7 of 7 cycles Dose modification:   (original dose 742.8 mg, Cycle 1),   (original dose 736.8 mg, Cycle 2),   (original dose 736.8 mg, Cycle 3),   (original dose 736.8 mg, Cycle 4),   (original dose 736.8 mg, Cycle 5),   (original dose 736.8 mg, Cycle 6),   (original dose 736.8 mg, Cycle 7) Administration: 740 mg (08/24/2018), 740 mg (09/14/2018), 740 mg (10/05/2018), 740 mg (12/01/2018), 740 mg (01/27/2019), 740 mg (02/22/2019), 740 mg (03/15/2019) PACLitaxel (TAXOL) 324 mg in sodium chloride 0.9 % 500 mL chemo infusion (> '80mg'$ /m2), 175 mg/m2 = 324 mg, Intravenous,  Once, 7 of 7 cycles Administration: 324 mg (08/24/2018), 324 mg (09/14/2018), 324 mg  (10/05/2018), 324 mg (12/01/2018), 324 mg (01/27/2019), 324 mg (02/22/2019), 324 mg (03/15/2019) fosaprepitant (EMEND) 150 mg, dexamethasone (DECADRON) 12 mg in sodium chloride 0.9 % 145 mL IVPB, , Intravenous,  Once, 7 of 7 cycles Administration:  (08/24/2018),  (09/14/2018),  (10/05/2018),  (12/01/2018),  (01/27/2019),  (02/22/2019),  (03/15/2019)  for chemotherapy treatment.       CANCER STAGING: Cancer Staging No matching staging information was found for the patient.   INTERVAL HISTORY:  Ms. Beining 49 y.o. female seen for follow-up of ovarian cancer.  She has completed last cycle of chemotherapy on 03/15/2019.  She reports improvement in her energy levels.  Appetite is 100%.  Energy levels are 75%.  Reports soreness all over the body.  Also has some pain in the left upper quadrant.  Numbness in the hands has been stable.  REVIEW OF SYSTEMS:  Review of Systems  Endocrine: Positive for hot flashes.  Neurological: Positive for numbness.  All other systems reviewed and are negative.    PAST MEDICAL/SURGICAL HISTORY:  Past Medical History:  Diagnosis Date  . Anxiety and depression   . Arthritis of facet joints at multiple vertebral levels    L5-S1  . Constipation   . Dyslipidemia   . Family history of breast cancer   . Family history of uterine cancer   . History of kidney stones   . Insomnia   . Irritable bowel syndrome   . Migraine   . Muscle tension headache   . Neuropathy of finger   . Ovarian carcinoma ([place])   . Plantar fasciitis of right foot   .  Port-A-Cath in place 08/20/2018   Past Surgical History:  Procedure Laterality Date  . CHOLECYSTECTOMY  2008  . COLONOSCOPY N/A 08/13/2013   Procedure: COLONOSCOPY;  Surgeon: Rogene Houston, MD;  [facility];  Service: Endoscopy;  Laterality: N/A;  230-moved to 145 Ann to notify pt  . ESOPHAGOGASTRODUODENOSCOPY    . ESOPHAGOGASTRODUODENOSCOPY (EGD) WITH PROPOFOL N/A 07/20/2018   Procedure: ESOPHAGOGASTRODUODENOSCOPY (EGD)  WITH PROPOFOL;  Surgeon: Rogene Houston, MD;  [facility];  Service: Endoscopy;  Laterality: N/A;  Possible esophageal dilation.  . IR ANGIOGRAM SELECTIVE EACH ADDITIONAL VESSEL  08/01/2018  . IR ANGIOGRAM VISCERAL SELECTIVE  08/01/2018  . IR EMBO ART  VEN HEMORR LYMPH EXTRAV  INC GUIDE ROADMAPPING  08/01/2018  . IR IMAGING GUIDED PORT INSERTION  08/20/2018  . IR PERC PLEURAL DRAIN W/INDWELL CATH W/IMG GUIDE  07/08/2018  . IR THORACENTESIS ASP PLEURAL SPACE W/IMG GUIDE  07/07/2018  . IR US GUIDE VASC ACCESS RIGHT  08/01/2018  . PLEURAL EFFUSION DRAINAGE Left 07/13/2018   Procedure: DRAINAGE OF LOCULATED PLEURAL EFFUSION;  Surgeon: Ivin Poot, MD;  Location: [facility];  Service: Thoracic;  Laterality: Left;  . REMOVAL OF PLEURAL DRAINAGE CATHETER Left 08/20/2018   Procedure: REMOVAL OF PLEURAL DRAINAGE CATHETER;  Surgeon: Ivin Poot, MD;  Location: [facility];  Service: Thoracic;  Laterality: Left;  . REMOVAL OF PLEURAL DRAINAGE CATHETER Left 08/20/2018   Procedure: REMOVAL OF PLEURAL DRAINAGE CATHETER;  Surgeon: Ivin Poot, MD;  Location: [facility];  Service: Thoracic;  Laterality: Left;  . TALC PLEURODESIS Left 07/13/2018   Procedure: Talc Pleuradesis;  Surgeon: Prescott Gum, Collier Salina, MD;  Location: [facility];  Service: Thoracic;  Laterality: Left;  . TUBAL LIGATION Bilateral   . UTERINE ABLATION    . VIDEO ASSISTED THORACOSCOPY Left 07/13/2018   Procedure: VIDEO ASSISTED THORACOSCOPY;  Surgeon: Ivin Poot, MD;  Location: [facility] OR;  Service: Thoracic;  Laterality: Left;     SOCIAL HISTORY:  Social History   Socioeconomic History  . Marital status: Widowed    Spouse name: Not on file  . Number of children: 2  . Years of education: 2-College  . Highest education level: Not on file  Occupational History    Employer: [employer]  Tobacco Use  . Smoking status: Former Smoker    Packs/day: 0.50    Years: 17.00    Pack years: 8.50    Types: Cigarettes    Quit date: 06/22/2018    Years since  quitting: 0.8  . Smokeless tobacco: Never Used  Substance and Sexual Activity  . Alcohol use: Yes    Alcohol/week: 1.0 standard drinks    Types: 1 Glasses of wine per week    Comment: Drinks alcohol on occasion  . Drug use: No  . Sexual activity: Not on file  Other Topics Concern  . Not on file  Social History Narrative   Patient lives at home with her daughter.    Patient has 2 children.    Patient is widowed.    Patient is right handed.    Patient has her Associates degree.      Social Determinants of Health   Financial Resource Strain: Low Risk   . Difficulty of Paying Living Expenses: Not hard at all  Food Insecurity: No Food Insecurity  . Worried About Charity fundraiser in the Last Year: Never true  . Ran Out of Food in the Last Year: Never true  Transportation Needs: No  Transportation Needs  . Lack of Transportation (Medical): No  . Lack of Transportation (Non-Medical): No  Physical Activity: Inactive  . Days of Exercise per Week: 0 days  . Minutes of Exercise per Session: 0 min  Stress: Stress Concern Present  . Feeling of Stress : Very much  Social Connections: Somewhat Isolated  . Frequency of Communication with Friends and Family: More than three times a week  . Frequency of Social Gatherings with Friends and Family: More than three times a week  . Attends Religious Services: 1 to 4 times per year  . Active Member of Clubs or Organizations: No  . Attends Archivist Meetings: Never  . Marital Status: Widowed  Intimate Partner Violence: Not At Risk  . Fear of Current or Ex-Partner: No  . Emotionally Abused: No  . Physically Abused: No  . Sexually Abused: No    FAMILY HISTORY:  Family History  Problem Relation Age of Onset  . Hypertension Mother   . Obesity Mother   . Diabetes Mother   . Kidney disease Mother   . Peripheral vascular disease Father   . Atrial fibrillation Father   . COPD Brother   . Osteoporosis Brother   . Crohn's disease  Sister   . Uterine cancer Sister 42       maternal half sister  . Breast cancer Maternal Aunt 12  . Colon cancer Neg Hx     CURRENT MEDICATIONS:  Outpatient Encounter Medications as of 04/19/2019  Medication Sig Note  . Ascorbic Acid (VITAMIN C) 1000 MG tablet Take 1,000 mg by mouth every other day.   . bisacodyl (DULCOLAX) 5 MG EC tablet Take 5 mg by mouth 2 (two) times daily.   . Cholecalciferol (VITAMIN D) 50 MCG (2000 UT) CAPS Take 2,000 Units by mouth daily.   Marland Kitchen dexamethasone (DECADRON) 4 MG tablet TAKE ONE TABLET BY MOUTH DAILY.   Marland Kitchen dicyclomine (BENTYL) 10 MG capsule Take 1 capsule (10 mg total) by mouth 4 (four) times daily -  before meals and at bedtime. 12/22/2018: Typically during chemo  . gabapentin (NEURONTIN) 300 MG capsule TAKE ONE CAPSULE BY MOUTH THREE TIMES DAILY.   . magnesium oxide (MAG-OX) 400 MG tablet Take 400 mg by mouth every other day.   . pantoprazole (PROTONIX) 40 MG tablet TAKE ONE TABLET BY MOUTH TWICE DAILY. TAKE BEFORE A MEAL.   Marland Kitchen venlafaxine XR (EFFEXOR-XR) 150 MG 24 hr capsule Take 1 capsule (150 mg total) by mouth daily with breakfast.   . acetaminophen (TYLENOL) 500 MG tablet Take 1,000 mg by mouth every 6 (six) hours as needed for moderate pain.   Marland Kitchen ALPRAZolam (XANAX) 0.5 MG tablet TAKE ONE TABLET BY MOUTH AT BEDTIME AS NEEDED FOR ANXIETY. (Patient not taking: Reported on 04/19/2019)   . CARBOPLATIN IV Inject into the vein every 21 ( twenty-one) days.   Marland Kitchen lidocaine-prilocaine (EMLA) cream Apply small amount to port a cath site and cover with plastic wrap one hour prior to chemotherapy appointments (Patient not taking: Reported on 01/27/2019)   . ondansetron (ZOFRAN-ODT) 8 MG disintegrating tablet TAKE ONE TABLET BY MOUTH EVERY 8 HOURS AS NEEDED FOR NAUSEA AND VOMITING. (Patient not taking: Reported on 01/27/2019)   . Oxycodone HCl 10 MG TABS Take 1 tablet (10 mg total) by mouth every 4 (four) hours as needed. (Patient not taking: Reported on 04/19/2019)   .  PACLITAXEL IV Inject into the vein every 21 ( twenty-one) days.   . polyethylene glycol (MIRALAX /  GLYCOLAX) 17 g packet Take 17 g by mouth daily as needed for moderate constipation.    . senna (SENOKOT) 8.6 MG TABS tablet Take 1 tablet (8.6 mg total) by mouth daily as needed for mild constipation or moderate constipation. (Patient not taking: Reported on 03/15/2019)   . SUMAtriptan (IMITREX) 100 MG tablet TAKE ONE TABLET BY MOUTH PRN UP to TWICE DAILY AS NEEDED. (Patient not taking: Reported on 01/27/2019)    No facility-administered encounter medications on file as of 04/19/2019.    ALLERGIES:  Allergies  Allergen Reactions  . Morphine And Related Itching  . Nortriptyline Other (See Comments)    Significant weight gain  . Topamax [Topiramate] Diarrhea and Nausea Only  . Actifed Cold-Allergy [Chlorpheniramine-Phenyleph Er] Rash  . Amoxicillin Rash    Did it involve swelling of the face/tongue/throat, SOB, or low BP? Unknown Did it involve sudden or severe rash/hives, skin peeling, or any reaction on the inside of your mouth or nose? Unknown Did you need to seek medical attention at a hospital or doctor's office? Unknown When did it last happen? teenager If all above answers are "NO", may proceed with cephalosporin use.   . Codeine Hives  . Erythromycin Rash  . Penicillins Rash    Did it involve swelling of the face/tongue/throat, SOB, or low BP? Unknown Did it involve sudden or severe rash/hives, skin peeling, or any reaction on the inside of your mouth or nose? Unknown Did you need to seek medical attention at a hospital or doctor's office? Unknown When did it last happen? teenager If all above answers are "NO", may proceed with cephalosporin use.   [name] [Pseudoephedrine Hcl] Rash     PHYSICAL EXAM:  ECOG Performance status: 1  Vitals:   04/19/19 1124  BP: (!) 115/50  Pulse: 80  Resp: 16  Temp: (!) 97.3 F (36.3 C)  SpO2: 98%   Filed Weights   04/19/19  1124  Weight: 166 lb (75.3 kg)    Physical Exam Vitals reviewed.  Constitutional:      Appearance: Normal appearance.  Cardiovascular:     Rate and Rhythm: Normal rate and regular rhythm.     Heart sounds: Normal heart sounds.  Pulmonary:     Effort: Pulmonary effort is normal.     Breath sounds: Normal breath sounds.  Abdominal:     General: There is no distension.     Palpations: Abdomen is soft. There is no mass.  Skin:    General: Skin is warm.  Neurological:     General: No focal deficit present.     Mental Status: She is alert and oriented to person, place, and time.  Psychiatric:        Mood and Affect: Mood normal.        Behavior: Behavior normal.      LABORATORY DATA:  I have reviewed the labs as listed.  CBC    Component Value Date/Time   WBC 6.4 04/13/2019 1011   RBC 2.73 (L) 04/13/2019 1011   HGB 10.6 (L) 04/13/2019 1011   HCT 32.6 (L) 04/13/2019 1011   PLT 258 04/13/2019 1011   MCV 119.4 (H) 04/13/2019 1011   MCH 38.8 (H) 04/13/2019 1011   MCHC 32.5 04/13/2019 1011   RDW 18.1 (H) 04/13/2019 1011   LYMPHSABS 1.7 04/13/2019 1011   MONOABS 0.5 04/13/2019 1011   EOSABS 0.1 04/13/2019 1011   BASOSABS 0.0 04/13/2019 1011   CMP Latest Ref Rng & Units 04/13/2019 03/15/2019 02/22/2019  Glucose 70 - 99 mg/dL 103(H) 134(H) 105(H)  BUN 6 - 20 mg/dL '11 13 19  '$ Creatinine 0.44 - 1.00 mg/dL 0.63 0.65 0.65  Sodium 135 - 145 mmol/L 137 134(L) 140  Potassium 3.5 - 5.1 mmol/L 4.0 4.0 4.6  Chloride 98 - 111 mmol/L 102 102 103  CO2 22 - 32 mmol/L '26 23 26  '$ Calcium 8.9 - 10.3 mg/dL 9.5 9.0 9.6  Total Protein 6.5 - 8.1 g/dL 6.7 6.8 6.8  Total Bilirubin 0.3 - 1.2 mg/dL 0.6 0.4 0.8  Alkaline Phos 38 - 126 U/L 79 97 101  AST 15 - 41 U/L '24 27 21  '$ ALT 0 - 44 U/L '25 25 27       '$ DIAGNOSTIC IMAGING:  I have independently reviewed the scans and discussed with patient.   I have reviewed Venita Lick LPN's note and agree with the documentation.  I personally performed a  face-to-face visit, made revisions and my assessment and plan is as follows.    ASSESSMENT & PLAN:   Ovarian cancer, bilateral ([place]) 1.  Clinical stage IVa high-grade serous ovarian cancer, positive cytology of left pleural effusion: -4 cycles of carboplatin and paclitaxel from 08/24/2018 through 12/01/2018 -Robotic assisted laparoscopic TAH and BSO and omentectomy on 12/29/2018, pathology showing high-grade serous carcinoma, PT3P NX -Germline mutation testing was negative. -3 more cycles of chemotherapy completed on 03/15/2019. -We reviewed CT abdomen and pelvis dated 04/13/2019.  This showed no findings of active malignancy.  28% reduction in volume of the presumed chronic hematoma/chronic fluid collection in the spleen. -CA-125 level was normal. -We discussed the surveillance plan.  I plan to see her back in 2 months for follow-up.  We'll repeat CA-125 level at that time.  I plan to repeat CT scan in 3 to 4 months.  2.  Generalized pain/left upper quadrant pain: -She is taking oxycodone up to 6 tablets/day which is helping control the pain. -She had slight worsening of pain as she moved into a new house.  3.  Peripheral neuropathy: -She is taking gabapentin 300 mg at bedtime.  She could not tolerate 3 times a day dose due to drowsiness.  4.  Hot flashes: -She is taking venlafaxine 150 mg daily.   Orders placed this encounter:  Orders Placed This Encounter  Procedures  . CBC with Differential  . Comprehensive metabolic panel  . Magnesium  . CA [place], MD [place] [phone]  \

## 2019-04-30 ENCOUNTER — Other Ambulatory Visit: Payer: Self-pay | Admitting: Neurology

## 2019-05-07 ENCOUNTER — Other Ambulatory Visit (HOSPITAL_COMMUNITY): Payer: Self-pay | Admitting: *Deleted

## 2019-05-07 DIAGNOSIS — C563 Malignant neoplasm of bilateral ovaries: Secondary | ICD-10-CM

## 2019-05-07 DIAGNOSIS — C561 Malignant neoplasm of right ovary: Secondary | ICD-10-CM

## 2019-05-07 MED ORDER — OXYCODONE HCL 10 MG PO TABS: 10.0000 mg | ORAL_TABLET | ORAL | 0 refills | Status: DC | PRN

## 2019-05-26 ENCOUNTER — Encounter (HOSPITAL_COMMUNITY): Payer: Self-pay | Admitting: *Deleted

## 2019-05-26 NOTE — Progress Notes (Signed)
Patient called yesterday and said her PCP deferred to Med/Onc to make the decision. She is having pain that she has had since 2018 and put off due to her husbands death and then her cancer, however, the pain is like sciatic pain but runs down the front of her groin, thigh to knee.    Also, she has always taken valiums and for whatever reason she was switched to Xanax to see if it would help her. She is reporting excessive "zombie" like feeling the next day, she is even quartering the pills and it still makes her have that hang over feeling. She wants to know if she can switch back to her valiums?   I have talked with Dr. Delton Coombes about the above and he wants to refer her to neuro for evaluation of leg pain.  He gave orders to switch her back to Valiums.    I have notified patient and she verbalizes understanding.

## 2019-05-28 ENCOUNTER — Other Ambulatory Visit (HOSPITAL_COMMUNITY): Payer: Self-pay | Admitting: *Deleted

## 2019-05-29 MED ORDER — DIAZEPAM 5 MG PO TABS: 5.0000 mg | ORAL_TABLET | Freq: Every day | ORAL | 5 refills | Status: DC

## 2019-06-03 ENCOUNTER — Other Ambulatory Visit (HOSPITAL_COMMUNITY): Payer: Self-pay | Admitting: *Deleted

## 2019-06-03 DIAGNOSIS — C563 Malignant neoplasm of bilateral ovaries: Secondary | ICD-10-CM

## 2019-06-03 MED ORDER — OXYCODONE HCL 10 MG PO TABS: 10.0000 mg | ORAL_TABLET | ORAL | 0 refills | Status: DC | PRN

## 2019-06-05 ENCOUNTER — Other Ambulatory Visit (HOSPITAL_COMMUNITY): Payer: Self-pay | Admitting: Hematology

## 2019-06-16 ENCOUNTER — Inpatient Hospital Stay (HOSPITAL_COMMUNITY): Payer: Medicaid Other | Attending: Hematology

## 2019-06-16 ENCOUNTER — Encounter (HOSPITAL_COMMUNITY): Payer: Self-pay

## 2019-06-16 ENCOUNTER — Other Ambulatory Visit: Payer: Self-pay

## 2019-06-16 DIAGNOSIS — Z90710 Acquired absence of both cervix and uterus: Secondary | ICD-10-CM | POA: Insufficient documentation

## 2019-06-16 DIAGNOSIS — G629 Polyneuropathy, unspecified: Secondary | ICD-10-CM | POA: Diagnosis not present

## 2019-06-16 DIAGNOSIS — Z9079 Acquired absence of other genital organ(s): Secondary | ICD-10-CM | POA: Diagnosis not present

## 2019-06-16 DIAGNOSIS — Z87891 Personal history of nicotine dependence: Secondary | ICD-10-CM | POA: Diagnosis not present

## 2019-06-16 DIAGNOSIS — C563 Malignant neoplasm of bilateral ovaries: Secondary | ICD-10-CM

## 2019-06-16 DIAGNOSIS — Z9221 Personal history of antineoplastic chemotherapy: Secondary | ICD-10-CM | POA: Insufficient documentation

## 2019-06-16 DIAGNOSIS — Z452 Encounter for adjustment and management of vascular access device: Secondary | ICD-10-CM | POA: Insufficient documentation

## 2019-06-16 DIAGNOSIS — Z79899 Other long term (current) drug therapy: Secondary | ICD-10-CM | POA: Insufficient documentation

## 2019-06-16 DIAGNOSIS — N951 Menopausal and female climacteric states: Secondary | ICD-10-CM | POA: Insufficient documentation

## 2019-06-16 DIAGNOSIS — C562 Malignant neoplasm of left ovary: Secondary | ICD-10-CM | POA: Diagnosis present

## 2019-06-16 DIAGNOSIS — Z90722 Acquired absence of ovaries, bilateral: Secondary | ICD-10-CM | POA: Diagnosis not present

## 2019-06-16 DIAGNOSIS — C561 Malignant neoplasm of right ovary: Secondary | ICD-10-CM | POA: Insufficient documentation

## 2019-06-16 DIAGNOSIS — R1012 Left upper quadrant pain: Secondary | ICD-10-CM | POA: Insufficient documentation

## 2019-06-16 LAB — CBC WITH DIFFERENTIAL/PLATELET
Abs Immature Granulocytes: 0.03 10*3/uL (ref 0.00–0.07)
Basophils Absolute: 0 10*3/uL (ref 0.0–0.1)
Basophils Relative: 0 %
Eosinophils Absolute: 0.2 10*3/uL (ref 0.0–0.5)
Eosinophils Relative: 2 %
HCT: 38.7 % (ref 36.0–46.0)
Hemoglobin: 12.9 g/dL (ref 12.0–15.0)
Immature Granulocytes: 0 %
Lymphocytes Relative: 25 %
Lymphs Abs: 2.1 10*3/uL (ref 0.7–4.0)
MCH: 38.3 pg — ABNORMAL HIGH (ref 26.0–34.0)
MCHC: 33.3 g/dL (ref 30.0–36.0)
MCV: 114.8 fL — ABNORMAL HIGH (ref 80.0–100.0)
Monocytes Absolute: 0.5 10*3/uL (ref 0.1–1.0)
Monocytes Relative: 6 %
Neutro Abs: 5.4 10*3/uL (ref 1.7–7.7)
Neutrophils Relative %: 67 %
Platelets: 333 10*3/uL (ref 150–400)
RBC: 3.37 MIL/uL — ABNORMAL LOW (ref 3.87–5.11)
RDW: 11.7 % (ref 11.5–15.5)
WBC: 8.2 10*3/uL (ref 4.0–10.5)
nRBC: 0 % (ref 0.0–0.2)

## 2019-06-16 LAB — COMPREHENSIVE METABOLIC PANEL
ALT: 25 U/L (ref 0–44)
AST: 22 U/L (ref 15–41)
Albumin: 4 g/dL (ref 3.5–5.0)
Alkaline Phosphatase: 79 U/L (ref 38–126)
Anion gap: 9 (ref 5–15)
BUN: 13 mg/dL (ref 6–20)
CO2: 27 mmol/L (ref 22–32)
Calcium: 9.7 mg/dL (ref 8.9–10.3)
Chloride: 100 mmol/L (ref 98–111)
Creatinine, Ser: 0.56 mg/dL (ref 0.44–1.00)
GFR calc Af Amer: >60 mL/min (ref >60)
GFR calc non Af Amer: >60 mL/min (ref >60)
Glucose, Bld: 101 mg/dL — ABNORMAL HIGH (ref 70–99)
Potassium: 3.8 mmol/L (ref 3.5–5.1)
Sodium: 136 mmol/L (ref 135–145)
Total Bilirubin: 0.6 mg/dL (ref 0.3–1.2)
Total Protein: 7.1 g/dL (ref 6.5–8.1)

## 2019-06-16 LAB — MAGNESIUM: Magnesium: 1.7 mg/dL (ref 1.7–2.4)

## 2019-06-16 MED ORDER — HEPARIN SOD (PORK) LOCK FLUSH 100 UNIT/ML IV SOLN
500.0000 [IU] | Freq: Once | INTRAVENOUS | Status: AC
  Administered 2019-06-16: 500 [IU] via INTRAVENOUS

## 2019-06-16 MED ORDER — SODIUM CHLORIDE 0.9% FLUSH
10.0000 mL | Freq: Once | INTRAVENOUS | Status: AC
  Administered 2019-06-16: 10 mL via INTRAVENOUS

## 2019-06-16 NOTE — Progress Notes (Signed)
Patients port flushed without difficulty.  Good blood return noted with no bruising or swelling noted at site.  Band aid applied.  VSS with discharge and left ambulatory with no s/s of distress noted.  

## 2019-06-17 LAB — CA 125: Cancer Antigen (CA) 125: 5.9 U/mL (ref 0.0–38.1)

## 2019-06-23 ENCOUNTER — Inpatient Hospital Stay (HOSPITAL_BASED_OUTPATIENT_CLINIC_OR_DEPARTMENT_OTHER): Payer: Medicaid Other | Admitting: Hematology

## 2019-06-23 VITALS — BP 108/55 | HR 87 | Temp 98.1°F | Resp 18 | Wt 166.3 lb

## 2019-06-23 DIAGNOSIS — C563 Malignant neoplasm of bilateral ovaries: Secondary | ICD-10-CM

## 2019-06-23 DIAGNOSIS — Z452 Encounter for adjustment and management of vascular access device: Secondary | ICD-10-CM | POA: Diagnosis not present

## 2019-06-23 DIAGNOSIS — C561 Malignant neoplasm of right ovary: Secondary | ICD-10-CM | POA: Diagnosis not present

## 2019-06-23 DIAGNOSIS — C562 Malignant neoplasm of left ovary: Secondary | ICD-10-CM

## 2019-06-23 NOTE — Progress Notes (Signed)
[place], [place] [address]   CLINIC:  Medical Oncology/Hematology  PCP:  [name], MD [address] / [place] [address] [phone]   REASON FOR VISIT:  Follow-up for high-grade serous ovarian cancer.  PRIOR THERAPY:  1. Aloxi from 08/24/2018 through 03/15/2019  CURRENT THERAPY: Carboplatin & paclitaxel  BRIEF ONCOLOGIC HISTORY:  Oncology History  Ovarian cancer, bilateral ([place])  07/07/2018 Pathology Results   PLEURAL FLUID, LEFT (SPECIMEN 1 OF 1, COLLECTED 07/07/18): - MALIGNANT CELLS CONSISTENT WITH ADENOCARCINOMA - SEE COMMENT  Source Pleural Fluid, (Specimen 1 of 1, collected on 07/07/2018) Gross Specimen: Received is/are 1000cc of bloody red fluid with tissue. (TC:tc) Prepared: # Smears: 0 # Concentration Technique Slides (i.e. ThinPrep): 1 # Cell Block: 1 Conventional Additional Studies: Two Hematology slides labeled [id number] Comment The malignant cells are positive for cytokeratin 7, p53, WT-1, Pax-8, Moc31, ER (weak) and EMA but negative for cytokeratin 20, TTF-1, GATA-3, CDX-2 and D2-40. Overall, the phenotype is consistent with a gynecologic primary; clinical correlation recommended.   07/07/2018 Procedure   Successful ultrasound guided left thoracentesis yielding 2.0 L of pleural fluid   07/08/2018 Procedure   1. Technically successful placement of left 14 French pigtail chest drain, placed to Pleur-evac water-seal.   07/08/2018 Procedure   1. Technically successful five [place] double lumen power injectable PICC placement   07/09/2018 Imaging   Ct chest 1. There is a moderate, loculated left hydropneumothorax with a small air component and moderate fluid component. The largest loculated component is located posteriorly. There is a pigtail drainage catheter about the lateral pleural space. There is no obvious etiology, such as obvious mass or pleural disease.   2. There is a small right pleural effusion with associated  atelectasis or consolidation and a subpleural consolidation of the superior segment right lower lobe (series 4, image 56), of uncertain significance, possibly infectious or inflammatory   07/10/2018 Imaging   Ct abdomen and pelvis: 1. The bilateral ovaries are enlarged by heterogeneous appearing cystic lesions, measuring 5.3 x 4.2 cm on the right (series 4, image 72) and 4.5 x 3.2 cm on the left (series 4, image 75). Consider dedicated pelvic ultrasound and/or pelvic MRI to further evaluate for solid components given high suspicion for GYN primary malignancy.   2. No other evidence of mass and no lymphadenopathy in the abdomen or pelvis.   3. Trace ascites. There is some suggestion of omental and peritoneal nodularity (e.g. Series 4, image 100), concerning for peritoneal metastatic disease.    4. Loculated left-sided pleural effusion with left-sided pleural drainage catheter in position. Small right pleural effusion   07/13/2018 Surgery   OPERATION: 1.  Left VATS (video-assisted thoracoscopic surgery) for drainage of loculated pleural effusion. 2.  Talc pleurodesis for malignant pleural effusion. 3.  Placement of PleurX catheter for management of malignant pleural effusion. 4.  Placement of On-Q analgesia catheter system.    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   07/13/2018 Pathology Results   Pleura, peel, Left Pleural - FIBRO-FIBRINOUS PLEURITIS - NEGATIVE FOR MALIGNANCY   07/18/2018 Initial Diagnosis   Ovarian cancer, bilateral ([place])   07/20/2018 Procedure   EGD impression: Normal proximal esophagus and mid esophagus. Mild distal esophageal rings; dilation not performed because of esophagitis. LA Grade C reflux esophagitis. Z-line regular, 30 cm  from the incisors. 5 cm hiatal  hernia. Non-bleeding gastric ulcer with no stigmata of bleeding. Gastritis. Duodenal erosions without bleeding. Normal second portion of the duodenum. No specimens collected.   08/19/2018 Genetic Testing   Negative genetic testing on the common hereditary cancer panel.  The Common Hereditary Gene Panel offered by [facility] includes sequencing and/or deletion duplication testing of the following 48 genes: APC, ATM, AXIN2, BARD1, BMPR1A, BRCA1, BRCA2, BRIP1, CDH1, CDK4, CDKN2A (p14ARF), CDKN2A (p16INK4a), CHEK2, CTNNA1, DICER1, EPCAM (Deletion/duplication testing only), GREM1 (promoter region deletion/duplication testing only), KIT, MEN1, MLH1, MSH2, MSH3, MSH6, MUTYH, NBN, NF1, NHTL1, PALB2, PDGFRA, PMS2, POLD1, POLE, PTEN, RAD50, RAD51C, RAD51D, RNF43, SDHB, SDHC, SDHD, SMAD4, SMARCA4. STK11, TP53, TSC1, TSC2, and VHL.  The following genes were evaluated for sequence changes only: SDHA and HOXB13 c.251G>A variant only. The report date is August 19, 2018.   08/24/2018 -  Chemotherapy   The patient had palonosetron (ALOXI) injection 0.25 mg, 0.25 mg, Intravenous,  Once, 7 of 7 cycles Administration: 0.25 mg (08/24/2018), 0.25 mg (09/14/2018), 0.25 mg (10/05/2018), 0.25 mg (12/01/2018), 0.25 mg (01/27/2019), 0.25 mg (02/22/2019), 0.25 mg (03/15/2019) CARBOplatin (PARAPLATIN) 740 mg in sodium chloride 0.9 % 250 mL chemo infusion, 740 mg (100 % of original dose 742.8 mg), Intravenous,  Once, 7 of 7 cycles Dose modification:   (original dose 742.8 mg, Cycle 1),   (original dose 736.8 mg, Cycle 2),   (original dose 736.8 mg, Cycle 3),   (original dose 736.8 mg, Cycle 4),   (original dose 736.8 mg, Cycle 5),   (original dose 736.8 mg, Cycle 6),   (original dose 736.8 mg, Cycle 7) Administration: 740 mg (08/24/2018), 740 mg (09/14/2018), 740 mg (10/05/2018), 740 mg (12/01/2018), 740 mg (01/27/2019), 740 mg (02/22/2019), 740 mg (03/15/2019) PACLitaxel (TAXOL) 324 mg in sodium chloride 0.9 % 500 mL chemo infusion (> 74m/m2), 175  mg/m2 = 324 mg, Intravenous,  Once, 7 of 7 cycles Administration: 324 mg (08/24/2018), 324 mg (09/14/2018), 324 mg (10/05/2018), 324 mg (12/01/2018), 324 mg (01/27/2019), 324 mg (02/22/2019), 324 mg (03/15/2019) fosaprepitant (EMEND) 150 mg, dexamethasone (DECADRON) 12 mg in sodium chloride 0.9 % 145 mL IVPB, , Intravenous,  Once, 7 of 7 cycles Administration:  (08/24/2018),  (09/14/2018),  (10/05/2018),  (12/01/2018),  (01/27/2019),  (02/22/2019),  (03/15/2019)  for chemotherapy treatment.      CANCER STAGING: Cancer Staging No matching staging information was found for the patient.  INTERVAL HISTORY:  Michelle Holt a 49y.o. female, returns for routine follow-up of her high-grade serous ovarian cancer..Michelle Maleswas last seen on 04/19/2019.  Today Michelle Holt reports feeling well, except for 8/10 pain in her L hip and leg. Michelle Holt takes Aleve x5 tablets and oxycodone for the leg pain which helps just a little. Michelle Holt will be seeing a neurologist on 07/20/2019. Michelle Holt has a little abdominal pain, which Michelle Holt attributes to her hernia.   REVIEW OF SYSTEMS:  Review of Systems  Constitutional: Positive for fatigue (mild). Negative for appetite change.  Gastrointestinal: Positive for abdominal pain.  Musculoskeletal: Positive for arthralgias (8/10 L hip and leg pain).  All other systems reviewed and are negative.   PAST MEDICAL/SURGICAL HISTORY:  Past Medical History:  Diagnosis Date  . Anxiety and depression   . Arthritis of facet joints at multiple vertebral levels    L5-S1  . Constipation   . Dyslipidemia   . Family history of breast cancer   . Family history of uterine cancer   . History of kidney stones   .  Insomnia   . Irritable bowel syndrome   . Migraine   . Muscle tension headache   . Neuropathy of finger   . Ovarian carcinoma ([place])   . Plantar fasciitis of right foot   . Port-A-Cath in place 08/20/2018   Past Surgical History:  Procedure Laterality Date  . CHOLECYSTECTOMY  2008  . COLONOSCOPY N/A  08/13/2013   Procedure: COLONOSCOPY;  Surgeon: Rogene Houston, MD;  [facility];  Service: Endoscopy;  Laterality: N/A;  230-moved to 145 Ann to notify pt  . ESOPHAGOGASTRODUODENOSCOPY    . ESOPHAGOGASTRODUODENOSCOPY (EGD) WITH PROPOFOL N/A 07/20/2018   Procedure: ESOPHAGOGASTRODUODENOSCOPY (EGD) WITH PROPOFOL;  Surgeon: Rogene Houston, MD;  [facility];  Service: Endoscopy;  Laterality: N/A;  Possible esophageal dilation.  . IR ANGIOGRAM SELECTIVE EACH ADDITIONAL VESSEL  08/01/2018  . IR ANGIOGRAM VISCERAL SELECTIVE  08/01/2018  . IR EMBO ART  VEN HEMORR LYMPH EXTRAV  INC GUIDE ROADMAPPING  08/01/2018  . IR IMAGING GUIDED PORT INSERTION  08/20/2018  . IR PERC PLEURAL DRAIN W/INDWELL CATH W/IMG GUIDE  07/08/2018  . IR THORACENTESIS ASP PLEURAL SPACE W/IMG GUIDE  07/07/2018  . IR US GUIDE VASC ACCESS RIGHT  08/01/2018  . PLEURAL EFFUSION DRAINAGE Left 07/13/2018   Procedure: DRAINAGE OF LOCULATED PLEURAL EFFUSION;  Surgeon: Ivin Poot, MD;  Location: [facility];  Service: Thoracic;  Laterality: Left;  . REMOVAL OF PLEURAL DRAINAGE CATHETER Left 08/20/2018   Procedure: REMOVAL OF PLEURAL DRAINAGE CATHETER;  Surgeon: Ivin Poot, MD;  Location: [facility];  Service: Thoracic;  Laterality: Left;  . REMOVAL OF PLEURAL DRAINAGE CATHETER Left 08/20/2018   Procedure: REMOVAL OF PLEURAL DRAINAGE CATHETER;  Surgeon: Ivin Poot, MD;  Location: [facility];  Service: Thoracic;  Laterality: Left;  . TALC PLEURODESIS Left 07/13/2018   Procedure: Talc Pleuradesis;  Surgeon: Prescott Gum, Collier Salina, MD;  Location: [facility];  Service: Thoracic;  Laterality: Left;  . TUBAL LIGATION Bilateral   . UTERINE ABLATION    . VIDEO ASSISTED THORACOSCOPY Left 07/13/2018   Procedure: VIDEO ASSISTED THORACOSCOPY;  Surgeon: Ivin Poot, MD;  [facility];  Service: Thoracic;  Laterality: Left;    SOCIAL HISTORY:  Social History   Socioeconomic History  . Marital status: Widowed    Spouse name: Not on file    . Number of children: 2  . Years of education: 2-College  . Highest education level: Not on file  Occupational History    Employer: [employer]  Tobacco Use  . Smoking status: Former Smoker    Packs/day: 0.50    Years: 17.00    Pack years: 8.50    Types: Cigarettes    Quit date: 06/22/2018    Years since quitting: 1.0  . Smokeless tobacco: Never Used  Substance and Sexual Activity  . Alcohol use: Yes    Alcohol/week: 1.0 standard drinks    Types: 1 Glasses of wine per week    Comment: Drinks alcohol on occasion  . Drug use: No  . Sexual activity: Not on file  Other Topics Concern  . Not on file  Social History Narrative   Patient lives at home with her daughter.    Patient has 2 children.    Patient is widowed.    Patient is right handed.    Patient has her Associates degree.      Social Determinants of Health   Financial Resource Strain: Low Risk   . Difficulty of Paying Living Expenses:  Not hard at all  Food Insecurity: No Food Insecurity  . Worried About Charity fundraiser in the Last Year: Never true  . Ran Out of Food in the Last Year: Never true  Transportation Needs: No Transportation Needs  . Lack of Transportation (Medical): No  . Lack of Transportation (Non-Medical): No  Physical Activity: Inactive  . Days of Exercise per Week: 0 days  . Minutes of Exercise per Session: 0 min  Stress: Stress Concern Present  . Feeling of Stress : Very much  Social Connections: Somewhat Isolated  . Frequency of Communication with Friends and Family: More than three times a week  . Frequency of Social Gatherings with Friends and Family: More than three times a week  . Attends Religious Services: 1 to 4 times per year  . Active Member of Clubs or Organizations: No  . Attends Archivist Meetings: Never  . Marital Status: Widowed  Intimate Partner Violence: Not At Risk  . Fear of Current or Ex-Partner: No  . Emotionally Abused: No  . Physically Abused: No  .  Sexually Abused: No    FAMILY HISTORY:  Family History  Problem Relation Age of Onset  . Hypertension Mother   . Obesity Mother   . Diabetes Mother   . Kidney disease Mother   . Peripheral vascular disease Father   . Atrial fibrillation Father   . COPD Brother   . Osteoporosis Brother   . Crohn's disease Sister   . Uterine cancer Sister 77       maternal half sister  . Breast cancer Maternal Aunt 58  . Colon cancer Neg Hx     CURRENT MEDICATIONS:  Current Outpatient Medications  Medication Sig Dispense Refill  . acetaminophen (TYLENOL) 500 MG tablet Take 1,000 mg by mouth every 6 (six) hours as needed for moderate pain.    . Ascorbic Acid (VITAMIN C) 1000 MG tablet Take 1,000 mg by mouth every other day.    . bisacodyl (DULCOLAX) 5 MG EC tablet Take 5 mg by mouth 2 (two) times daily.    Marland Kitchen CARBOPLATIN IV Inject into the vein every 21 ( twenty-one) days.    . Cholecalciferol (VITAMIN D) 50 MCG (2000 UT) CAPS Take 2,000 Units by mouth daily.    Marland Kitchen dexamethasone (DECADRON) 4 MG tablet TAKE ONE TABLET BY MOUTH DAILY. 30 tablet 1  . diazepam (VALIUM) 5 MG tablet Take 1 tablet (5 mg total) by mouth at bedtime. 30 tablet 5  . dicyclomine (BENTYL) 10 MG capsule Take 1 capsule (10 mg total) by mouth 4 (four) times daily -  before meals and at bedtime. 60 capsule 2  . gabapentin (NEURONTIN) 300 MG capsule TAKE ONE CAPSULE BY MOUTH THREE TIMES DAILY. 90 capsule 6  . lidocaine-prilocaine (EMLA) cream Apply small amount to port a cath site and cover with plastic wrap one hour prior to chemotherapy appointments 30 g 3  . magnesium oxide (MAG-OX) 400 MG tablet Take 400 mg by mouth every other day.    . ondansetron (ZOFRAN-ODT) 8 MG disintegrating tablet TAKE ONE TABLET BY MOUTH EVERY 8 HOURS AS NEEDED FOR NAUSEA AND VOMITING. (Patient not taking: Reported on 01/27/2019) 30 tablet 2  . Oxycodone HCl 10 MG TABS Take 1 tablet (10 mg total) by mouth every 4 (four) hours as needed. 168 tablet 0  .  PACLITAXEL IV Inject into the vein every 21 ( twenty-one) days.    . pantoprazole (PROTONIX) 40 MG tablet TAKE ONE  TABLET BY MOUTH TWICE DAILY. TAKE BEFORE A MEAL. 60 tablet 2  . polyethylene glycol (MIRALAX / GLYCOLAX) 17 g packet Take 17 g by mouth daily as needed for moderate constipation.     . senna (SENOKOT) 8.6 MG TABS tablet Take 1 tablet (8.6 mg total) by mouth daily as needed for mild constipation or moderate constipation. 120 tablet 0  . SUMAtriptan (IMITREX) 100 MG tablet TAKE ONE TABLET BY MOUTH PRN UP to TWICE DAILY AS NEEDED. 9 tablet 5  . venlafaxine XR (EFFEXOR-XR) 150 MG 24 hr capsule Take 1 capsule (150 mg total) by mouth daily with breakfast. 30 capsule 3   No current facility-administered medications for this visit.    ALLERGIES:  Allergies  Allergen Reactions  . Morphine And Related Itching  . Nortriptyline Other (See Comments)    Significant weight gain  . Topamax [Topiramate] Diarrhea and Nausea Only  . Actifed Cold-Allergy [Chlorpheniramine-Phenyleph Er] Rash  . Amoxicillin Rash    Did it involve swelling of the face/tongue/throat, SOB, or low BP? Unknown Did it involve sudden or severe rash/hives, skin peeling, or any reaction on the inside of your mouth or nose? Unknown Did you need to seek medical attention at a hospital or doctor's office? Unknown When did it last happen? teenager If all above answers are "NO", may proceed with cephalosporin use.   . Codeine Hives  . Erythromycin Rash  . Penicillins Rash    Did it involve swelling of the face/tongue/throat, SOB, or low BP? Unknown Did it involve sudden or severe rash/hives, skin peeling, or any reaction on the inside of your mouth or nose? Unknown Did you need to seek medical attention at a hospital or doctor's office? Unknown When did it last happen? teenager If all above answers are "NO", may proceed with cephalosporin use.   [name] [Pseudoephedrine Hcl] Rash    PHYSICAL EXAM:   Performance status (ECOG): 1 - Symptomatic but completely ambulatory  Vitals:   06/23/19 1146  BP: (!) 108/55  Pulse: 87  Resp: 18  Temp: 98.1 F (36.7 C)  SpO2: 98%   Wt Readings from Last 3 Encounters:  06/23/19 166 lb 4.8 oz (75.4 kg)  04/19/19 166 lb (75.3 kg)  03/15/19 172 lb 3.2 oz (78.1 kg)   Physical Exam Vitals reviewed.  Constitutional:      Appearance: Normal appearance.  Cardiovascular:     Rate and Rhythm: Normal rate and regular rhythm.     Pulses: Normal pulses.     Heart sounds: Normal heart sounds.  Pulmonary:     Effort: Pulmonary effort is normal.     Breath sounds: Normal breath sounds.  Abdominal:     Palpations: Abdomen is soft. There is no mass.     Tenderness: There is no abdominal tenderness.  Musculoskeletal:     Right lower leg: No edema.     Left lower leg: No edema.  Neurological:     General: No focal deficit present.     Mental Status: Michelle Holt is alert and oriented to person, place, and time.  Psychiatric:        Mood and Affect: Mood normal.        Behavior: Behavior normal.      LABORATORY DATA:  I have reviewed the labs as listed.  CBC Latest Ref Rng & Units 06/16/2019 04/13/2019 03/15/2019  WBC 4.0 - 10.5 K/uL 8.2 6.4 7.2  Hemoglobin 12.0 - 15.0 g/dL 12.9 10.6(L) 11.6(L)  Hematocrit 36.0 - 46.0 %  38.7 32.6(L) 35.9(L)  Platelets 150 - 400 K/uL 333 258 148(L)   CMP Latest Ref Rng & Units 06/16/2019 04/13/2019 03/15/2019  Glucose 70 - 99 mg/dL 101(H) 103(H) 134(H)  BUN 6 - 20 mg/dL _0 Creatinine 0.44 - 1.00 mg/dL 0.56 0.63 0.65  Sodium 135 - 145 mmol/L 136 137 134(L)  Potassium 3.5 - 5.1 mmol/L 3.8 4.0 4.0  Chloride 98 - 111 mmol/L 100 102 102  CO2 22 - 32 mmol/L _1 Calcium 8.9 - 10.3 mg/dL 9.7 9.5 9.0  Total Protein 6.5 - 8.1 g/dL 7.1 6.7 6.8  Total Bilirubin 0.3 - 1.2 mg/dL 0.6 0.6 0.4  Alkaline Phos 38 - 126 U/L 79 79 97  AST 15 - 41 U/L _2 ALT 0 - 44 U/L _3 DIAGNOSTIC IMAGING:  I have  independently reviewed the scans and discussed with the patient.   ASSESSMENT: 1.  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 -Robotic assisted laparoscopic TAH and BSO and omentectomy on 12/24/2018, pathology showing high-grade serous carcinoma, PT3P NX. -Germline mutation testing was negative. -3 more cycles of adjuvant chemotherapy completed on 03/15/2019. -CTAP on 04/13/2019 showed no findings of active malignancy.  28% reduction in the volume of presumed chronic hematoma/chronic fluid collection splaying the upper margin of the spleen.  Large type III hiatal hernia.   PLAN:  1.  Clinical stage IVa high-grade serous ovarian cancer, positive cytology of left pleural effusion: -I have reviewed her labs which did not show any evidence of abnormalities.  Tumor marker is within normal limits. -I will see her back in 2 months with labs and CT scan.  2.  Generalized pain/left upper quadrant pain: -Continue oxycodone, Michelle Holt is requiring up to 5 tablets/day.  Overall this pain has improved in the left upper quadrant.  However Michelle Holt is having left hip pain likely from sciatica and is having to take more pain medication.  Michelle Holt has follow-up with neurology.  3.  Peripheral neuropathy: -Continue gabapentin 300 mg at bedtime.  4.  Hot flashes: -Continue venlafaxine 150 mg daily.    Orders placed this encounter:  No orders of the defined types were placed in this encounter.    Derek Jack, MD [place] [phone]   I, Milinda Antis, am acting as a scribe for Dr. Sanda Linger.  I, Derek Jack MD, have reviewed the above documentation for accuracy and completeness, and I agree with the above.

## 2019-06-23 NOTE — Patient Instructions (Addendum)
[place] at [facility] Discharge Instructions  You were seen today by Dr. Delton Coombes. He went over your recent results and scans. Please follow up with your neurologist. You will be scheduled for a CT scan of your abdomen. Dr. Delton Coombes will see you back in 2 months for labs and follow up.   Thank you for choosing [place] at [facility] to provide your oncology and hematology care.  To afford each patient quality time with our provider, please arrive at least 15 minutes before your scheduled appointment time.   If you have a lab appointment with the [facility] please come in thru the Main Entrance and check in at the main information desk  You need to re-schedule your appointment should you arrive 10 or more minutes late.  We strive to give you quality time with our providers, and arriving late affects you and other patients whose appointments are after yours.  Also, if you no show three or more times for appointments you may be dismissed from the clinic at the providers discretion.     Again, thank you for choosing [facility].  Our hope is that these requests will decrease the amount of time that you wait before being seen by our physicians.       _____________________________________________________________  Should you have questions after your visit to [facility], please contact our office at [phone] between the hours of 8:00 a.m. and 4:30 p.m.  Voicemails left after 4:00 p.m. will not be returned until the following business day.  For prescription refill requests, have your pharmacy contact our office and allow 72 hours.    Cancer Center Support Programs:   > Cancer Support Group  2nd Tuesday of the month 1pm-2pm, Journey Room

## 2019-07-01 ENCOUNTER — Other Ambulatory Visit (HOSPITAL_COMMUNITY): Payer: Self-pay | Admitting: *Deleted

## 2019-07-01 DIAGNOSIS — C563 Malignant neoplasm of bilateral ovaries: Secondary | ICD-10-CM

## 2019-07-01 MED ORDER — GABAPENTIN 100 MG PO CAPS: 200.0000 mg | ORAL_CAPSULE | Freq: Every day | ORAL | 2 refills | Status: DC

## 2019-07-01 MED ORDER — OXYCODONE HCL 10 MG PO TABS: 10.0000 mg | ORAL_TABLET | ORAL | 0 refills | Status: DC | PRN

## 2019-07-01 NOTE — Telephone Encounter (Signed)
Patient reports that she has been taking gabapentin 200mg  only at bedtime.  She had an old prescription for 100mg .  She wants a refill only on the 100mg  and she is taking 2 capsules at bedtime.  Refill request sent to Dr. Delton Coombes.

## 2019-07-02 ENCOUNTER — Other Ambulatory Visit (HOSPITAL_COMMUNITY): Payer: Self-pay | Admitting: Hematology

## 2019-07-02 DIAGNOSIS — R232 Flushing: Secondary | ICD-10-CM

## 2019-07-06 ENCOUNTER — Other Ambulatory Visit (HOSPITAL_COMMUNITY): Payer: Self-pay | Admitting: *Deleted

## 2019-07-06 MED ORDER — TRIAMCINOLONE ACETONIDE 0.1 % EX CREA: 1.0000 "application " | TOPICAL_CREAM | Freq: Two times a day (BID) | CUTANEOUS | 2 refills | Status: DC

## 2019-07-06 NOTE — Telephone Encounter (Signed)
Patient called clinic reporting that her neuropathy in hands and feet was extreme.  She does not want to take 300mg  gabapentin QHS because it makes her groggy the next day. She is currently taking 200 mg QHS.  She reports that she also has rash on her hands that is burning.  I spoke with Francene Finders, NP and she wants patient to try spacing out her dose 100 mg three times daily and see if that helps her neuropathy.  We also sent refills in to her pharmacy for her triamcinolone cream to put on her hands.    Patient verbalizes understanding.

## 2019-07-20 ENCOUNTER — Encounter: Payer: Self-pay | Admitting: Neurology

## 2019-07-20 ENCOUNTER — Ambulatory Visit: Payer: Medicaid Other | Admitting: Neurology

## 2019-07-20 VITALS — BP 118/72 | HR 65 | Ht 66.0 in | Wt 166.0 lb

## 2019-07-20 DIAGNOSIS — G43019 Migraine without aura, intractable, without status migrainosus: Secondary | ICD-10-CM

## 2019-07-20 DIAGNOSIS — M25552 Pain in left hip: Secondary | ICD-10-CM

## 2019-07-20 NOTE — Progress Notes (Signed)
Reason for visit: Left hip pain  Referring physician: Dr. Merdis Delay is a 49 y.o. female  History of present illness:  Ms. Logsdon is a 49 year old right handed white female with a history of migraine headaches and a history of stage IV ovarian cancer.  The patient returns to the office today with a new issue at this time.  She claims that since May 2018 she has had left hip discomfort that involves the groin area and goes down to the knee on the anterior thigh that is worse with weightbearing, better with sitting or lying down.  Occasionally, she will have some hip problems at night while sleeping.  The patient was placed on gabapentin but could not tolerate a dose greater than 100 mg at night and she went off the medication.  The patient is having increasing problems with pain, she will limp with her walking if she is upright for longer periods of time.  The patient denies any problems controlling the bowels or the bladder.  Bending does increase pain to some degree.  She denies any numbness in her legs or true weakness but it hurts to flex at the hip significantly.  She does have some numbness in the hands and feet following chemotherapy.  The left hip pain predated the diagnosis of ovarian cancer by several years.  She is sent to this office for further evaluation.  Past Medical History:  Diagnosis Date  . Anxiety and depression   . Arthritis of facet joints at multiple vertebral levels    L5-S1  . Constipation   . Dyslipidemia   . Family history of breast cancer   . Family history of uterine cancer   . History of kidney stones   . Insomnia   . Irritable bowel syndrome   . Migraine   . Muscle tension headache   . Neuropathy of finger   . Ovarian carcinoma ([place])   . Plantar fasciitis of right foot   . Port-A-Cath in place 08/20/2018    Past Surgical History:  Procedure Laterality Date  . CHOLECYSTECTOMY  2008  . COLONOSCOPY N/A 08/13/2013   Procedure: COLONOSCOPY;   Surgeon: Rogene Houston, MD;  [facility];  Service: Endoscopy;  Laterality: N/A;  230-moved to 145 Ann to notify pt  . ESOPHAGOGASTRODUODENOSCOPY    . ESOPHAGOGASTRODUODENOSCOPY (EGD) WITH PROPOFOL N/A 07/20/2018   Procedure: ESOPHAGOGASTRODUODENOSCOPY (EGD) WITH PROPOFOL;  Surgeon: Rogene Houston, MD;  [facility];  Service: Endoscopy;  Laterality: N/A;  Possible esophageal dilation.  . IR ANGIOGRAM SELECTIVE EACH ADDITIONAL VESSEL  08/01/2018  . IR ANGIOGRAM VISCERAL SELECTIVE  08/01/2018  . IR EMBO ART  VEN HEMORR LYMPH EXTRAV  INC GUIDE ROADMAPPING  08/01/2018  . IR IMAGING GUIDED PORT INSERTION  08/20/2018  . IR PERC PLEURAL DRAIN W/INDWELL CATH W/IMG GUIDE  07/08/2018  . IR THORACENTESIS ASP PLEURAL SPACE W/IMG GUIDE  07/07/2018  . IR US GUIDE VASC ACCESS RIGHT  08/01/2018  . PLEURAL EFFUSION DRAINAGE Left 07/13/2018   Procedure: DRAINAGE OF LOCULATED PLEURAL EFFUSION;  Surgeon: Ivin Poot, MD;  Location: [place];  Service: Thoracic;  Laterality: Left;  . REMOVAL OF PLEURAL DRAINAGE CATHETER Left 08/20/2018   Procedure: REMOVAL OF PLEURAL DRAINAGE CATHETER;  Surgeon: Ivin Poot, MD;  Location: [place];  Service: Thoracic;  Laterality: Left;  . REMOVAL OF PLEURAL DRAINAGE CATHETER Left 08/20/2018   Procedure: REMOVAL OF PLEURAL DRAINAGE CATHETER;  Surgeon: Ivin Poot,  MD;  Location: [place];  Service: Thoracic;  Laterality: Left;  . TALC PLEURODESIS Left 07/13/2018   Procedure: Talc Pleuradesis;  Surgeon: Prescott Gum, Collier Salina, MD;  Location: [facility];  Service: Thoracic;  Laterality: Left;  . TUBAL LIGATION Bilateral   . UTERINE ABLATION    . VIDEO ASSISTED THORACOSCOPY Left 07/13/2018   Procedure: VIDEO ASSISTED THORACOSCOPY;  Surgeon: Ivin Poot, MD;  Location: [facility] OR;  Service: Thoracic;  Laterality: Left;    Family History  Problem Relation Age of Onset  . Hypertension Mother   . Obesity Mother   . Diabetes Mother   . Kidney disease Mother   . Peripheral  vascular disease Father   . Atrial fibrillation Father   . COPD Brother   . Osteoporosis Brother   . Crohn's disease Sister   . Uterine cancer Sister 32       maternal half sister  . Breast cancer Maternal Aunt 98  . Colon cancer Neg Hx     Social history:  reports that she quit smoking about 12 months ago. Her smoking use included cigarettes. She has a 8.50 pack-year smoking history. She has never used smokeless tobacco. She reports current alcohol use of about 1.0 standard drink of alcohol per week. She reports that she does not use drugs.  Medications:  Prior to Admission medications   Medication Sig Start Date End Date Taking? Authorizing Provider  acetaminophen (TYLENOL) 500 MG tablet Take 1,000 mg by mouth every 6 (six) hours as needed for moderate pain.   Yes [provider]  Ascorbic Acid (VITAMIN C) 1000 MG tablet Take 1,000 mg by mouth every other day.   Yes [provider]  bisacodyl (DULCOLAX) 5 MG EC tablet Take 5 mg by mouth 2 (two) times daily.   Yes [provider]  Cholecalciferol (VITAMIN D) 50 MCG (2000 UT) CAPS Take 2,000 Units by mouth daily.   Yes [provider]  dexamethasone (DECADRON) 4 MG tablet TAKE ONE TABLET BY MOUTH DAILY. Patient taking differently: as needed.  09/24/18  Yes Lockamy, Randi L, NP-C  diazepam (VALIUM) 5 MG tablet Take 1 tablet (5 mg total) by mouth at bedtime. 05/29/19  Yes Derek Jack, MD  dicyclomine (BENTYL) 10 MG capsule Take 1 capsule (10 mg total) by mouth 4 (four) times daily -  before meals and at bedtime. 12/01/18  Yes Derek Jack, MD  lidocaine-prilocaine (EMLA) cream Apply small amount to port a cath site and cover with plastic wrap one hour prior to chemotherapy appointments 08/20/18  Yes Derek Jack, MD  magnesium oxide (MAG-OX) 400 MG tablet Take 400 mg by mouth every other day.   Yes [provider]  naproxen sodium (ALEVE) 220 MG tablet Take 400 mg by mouth  daily as needed.   Yes [provider]  ondansetron (ZOFRAN-ODT) 8 MG disintegrating tablet TAKE ONE TABLET BY MOUTH EVERY 8 HOURS AS NEEDED FOR NAUSEA AND VOMITING. 11/30/18  Yes Derek Jack, MD  Oxycodone HCl 10 MG TABS Take 1 tablet (10 mg total) by mouth every 4 (four) hours as needed. 07/01/19  Yes Derek Jack, MD  pantoprazole (PROTONIX) 40 MG tablet TAKE ONE TABLET BY MOUTH TWICE DAILY BEFORE A MEAL. 07/02/19  Yes Lockamy, Randi L, NP-C  polyethylene glycol (MIRALAX / GLYCOLAX) 17 g packet Take 17 g by mouth daily as needed for moderate constipation.    Yes [provider]  senna (SENOKOT) 8.6 MG TABS tablet Take 1 tablet (8.6 mg total)  by mouth daily as needed for mild constipation or moderate constipation. 11/23/18  Yes Derek Jack, MD  SUMAtriptan (IMITREX) 100 MG tablet TAKE ONE TABLET BY MOUTH PRN UP to TWICE DAILY AS NEEDED. 08/12/18  Yes Suzzanne Cloud, NP  triamcinolone cream (KENALOG) 0.1 % Apply 1 application topically 2 (two) times daily. 07/06/19  Yes Lockamy, Randi L, NP-C  venlafaxine XR (EFFEXOR-XR) 150 MG 24 hr capsule TAKE ONE CAPSULE BY MOUTH DAILY WITH BREAKFAST Patient taking differently: at bedtime.  07/02/19  Yes Lockamy, Randi L, NP-C      Allergies  Allergen Reactions  . Morphine And Related Itching  . Nortriptyline Other (See Comments)    Significant weight gain  . Topamax [Topiramate] Diarrhea and Nausea Only  . Xanax [Alprazolam]   . Actifed Cold-Allergy [Chlorpheniramine-Phenyleph Er] Rash  . Amoxicillin Rash    Did it involve swelling of the face/tongue/throat, SOB, or low BP? Unknown Did it involve sudden or severe rash/hives, skin peeling, or any reaction on the inside of your mouth or nose? Unknown Did you need to seek medical attention at a hospital or doctor's office? Unknown When did it last happen? teenager If all above answers are "NO", may proceed with cephalosporin use.   . Codeine Hives  .  Erythromycin Rash  . Penicillins Rash    Did it involve swelling of the face/tongue/throat, SOB, or low BP? Unknown Did it involve sudden or severe rash/hives, skin peeling, or any reaction on the inside of your mouth or nose? Unknown Did you need to seek medical attention at a hospital or doctor's office? Unknown When did it last happen? teenager If all above answers are "NO", may proceed with cephalosporin use.   [name] [Pseudoephedrine Hcl] Rash    ROS:  Out of a complete 14 system review of symptoms, the patient complains only of the following symptoms, and all other reviewed systems are negative.  Left hip pain Numbness in the feet  Blood pressure 118/72, pulse 65, height 5\' 6"  (1.676 m), weight 166 lb (75.3 kg).  Physical Exam  General: The patient is alert and cooperative at the time of the examination.  Eyes: Pupils are equal, round, and reactive to light. Discs are flat bilaterally.  Neck: The neck is supple, no carotid bruits are noted.  Respiratory: The respiratory examination is clear.  Cardiovascular: The cardiovascular examination reveals a regular rate and rhythm, no obvious murmurs or rubs are noted.  Neuromuscular: Internal and external rotation of the left hip elicits severe pain, not present on the right.  Skin: Extremities are without significant edema.  Neurologic Exam  Mental status: The patient is alert and oriented x 3 at the time of the examination. The patient has apparent normal recent and remote memory, with an apparently normal attention span and concentration ability.  Cranial nerves: Facial symmetry is present. There is good sensation of the face to pinprick and soft touch bilaterally. The strength of the facial muscles and the muscles to head turning and shoulder shrug are normal bilaterally. Speech is well enunciated, no aphasia or dysarthria is noted. Extraocular movements are full. Visual fields are full. The tongue is midline, and  the patient has symmetric elevation of the soft palate. No obvious hearing deficits are noted.  Motor: The motor testing reveals 5 over 5 strength of all 4 extremities. Good symmetric motor tone is noted throughout.  Sensory: Sensory testing is intact to pinprick, soft touch, vibration sensation, and position sense on all 4 extremities. No  evidence of extinction is noted.  Coordination: Cerebellar testing reveals good finger-nose-finger and heel-to-shin bilaterally.  Gait and station: Gait is associated with a slight limping on the left leg. Tandem gait is normal. Romberg is negative. No drift is seen.  Reflexes: Deep tendon reflexes are symmetric and normal bilaterally, with exception of depression of the ankle jerk reflexes bilaterally. Toes are downgoing bilaterally.   Assessment/Plan:  1.  Chronic left hip pain  2.  Ovarian cancer  3.  Migraine headache  The patient is doing somewhat better with her migraine, she is having only 1-3 headaches a month.  The patient has had a chronic issue with left hip pain that likely is related to intrinsic left hip disease.  I do not believe that the patient has a radiculopathy.  The patient will be referred for orthopedic evaluation.  Jill Alexanders MD 07/20/2019 10:45 AM  [facility] [address] [place], [place] [address]  Phone [phone] Fax [phone]

## 2019-07-27 ENCOUNTER — Ambulatory Visit: Payer: Self-pay

## 2019-07-27 ENCOUNTER — Ambulatory Visit (INDEPENDENT_AMBULATORY_CARE_PROVIDER_SITE_OTHER): Payer: Medicaid Other | Admitting: Orthopaedic Surgery

## 2019-07-27 ENCOUNTER — Encounter: Payer: Self-pay | Admitting: Orthopaedic Surgery

## 2019-07-27 ENCOUNTER — Ambulatory Visit (INDEPENDENT_AMBULATORY_CARE_PROVIDER_SITE_OTHER): Payer: Medicaid Other

## 2019-07-27 VITALS — Ht 63.5 in | Wt 164.0 lb

## 2019-07-27 DIAGNOSIS — M25552 Pain in left hip: Secondary | ICD-10-CM

## 2019-07-27 NOTE — Progress Notes (Signed)
Office Visit Note   Patient: Michelle Holt           Date of Birth: 1970/12/11           MRN: [id number] Visit Date: 07/27/2019              Requested by: Kathrynn Ducking, MD [address] [place],  [place] [address] PCP: [name], MD   Assessment & Plan: Visit Diagnoses:  1. Pain in left hip     Plan: Impression is left hip moderately severe DJD.  Based on our discussion of treatment options patient would like to try cortisone injection today.  She will continue to take over-the-counter NSAIDs as needed.  Follow-up as needed.  Follow-Up Instructions: Return if symptoms worsen or fail to improve.   Orders:  Orders Placed This Encounter  Procedures  . XR HIP UNILAT W OR W/O PELVIS 2-3 VIEWS LEFT  . US Guided Needle Placement - No Linked Charges   No orders of the defined types were placed in this encounter.     Procedures: No procedures performed   Clinical Data: No additional findings.   Subjective: Chief Complaint  Patient presents with  . Left Hip - Pain    Michelle Holt is a 49 year old female here for evaluation of chronic left hip and groin pain for 3 years.  Pain wakes her up at night.  She has trouble squatting on the floor.  She denies any radicular symptoms.  She is currently in remission from ovarian cancer.  She had a CT scan of her chest abdomen pelvis in March.   Review of Systems  Constitutional: Negative.   HENT: Negative.   Eyes: Negative.   Respiratory: Negative.   Cardiovascular: Negative.   Endocrine: Negative.   Musculoskeletal: Negative.   Neurological: Negative.   Hematological: Negative.   Psychiatric/Behavioral: Negative.   All other systems reviewed and are negative.    Objective: Vital Signs: Ht 5' 3.5" (1.613 m)   Wt 164 lb (74.4 kg)   BMI 28.60 kg/m   Physical Exam Vitals and nursing note reviewed.  Constitutional:      Appearance: She is well-developed.  HENT:     Head: Normocephalic and atraumatic.    Pulmonary:     Effort: Pulmonary effort is normal.  Abdominal:     Palpations: Abdomen is soft.  Musculoskeletal:     Cervical back: Neck supple.  Skin:    General: Skin is warm.     Capillary Refill: Capillary refill takes less than 2 seconds.  Neurological:     Mental Status: She is alert and oriented to person, place, and time.  Psychiatric:        Behavior: Behavior normal.        Thought Content: Thought content normal.        Judgment: Judgment normal.     Ortho Exam Left hip shows a painful internal rotation.  Positive Stinchfield.  Negative straight leg.  Greater trochanter is minimally tender. Specialty Comments:  No specialty comments available.  Imaging: XR HIP UNILAT W OR W/O PELVIS 2-3 VIEWS LEFT  Result Date: 07/27/2019 Moderately severe left hip DJD    PMFS History: Patient Active Problem List   Diagnosis Date Noted  . Palliative care patient 09/18/2018  . Port-A-Cath in place 08/20/2018  . Genetic testing 08/20/2018  . 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 Splenic laceration 07/31/2018  . Family history of uterine cancer   .  Family history of breast cancer   . Symptomatic anemia 07/18/2018  . Upper GI bleeding 07/18/2018  . Ovarian cancer, bilateral ([place]) 07/18/2018  . HCAP (healthcare-associated pneumonia) 07/18/2018  . Pleural effusion 07/07/2018  . Migraine 07/06/2018  . Pleural effusion on left 07/06/2018  . Tobacco abuse 07/06/2018  . Generalized anxiety disorder 05/02/2014  . Unspecified constipation 08/10/2013  . Rectal bleeding 08/10/2013  . Lumbosacral spondylosis without myelopathy 10/23/2011  . Intractable migraine without aura 10/23/2011   Past Medical History:  Diagnosis Date  . Anxiety and depression   . Arthritis of facet joints at multiple vertebral levels    L5-S1  . Constipation   . Dyslipidemia   . Family history of breast cancer   . Family history of uterine cancer   . History of kidney  stones   . Insomnia   . Irritable bowel syndrome   . Migraine   . Muscle tension headache   . Neuropathy of finger   . Ovarian carcinoma ([place])   . Plantar fasciitis of right foot   . Port-A-Cath in place 08/20/2018    Family History  Problem Relation Age of Onset  . Hypertension Mother   . Obesity Mother   . Diabetes Mother   . Kidney disease Mother   . Peripheral vascular disease Father   . Atrial fibrillation Father   . COPD Brother   . Osteoporosis Brother   . Crohn's disease Sister   . Uterine cancer Sister 26       maternal half sister  . Breast cancer Maternal Aunt 10  . Colon cancer Neg Hx     Past Surgical History:  Procedure Laterality Date  . CHOLECYSTECTOMY  2008  . COLONOSCOPY N/A 08/13/2013   Procedure: COLONOSCOPY;  Surgeon: Rogene Houston, MD;  [facility];  Service: Endoscopy;  Laterality: N/A;  230-moved to 145 Ann to notify pt  . ESOPHAGOGASTRODUODENOSCOPY    . ESOPHAGOGASTRODUODENOSCOPY (EGD) WITH PROPOFOL N/A 07/20/2018   Procedure: ESOPHAGOGASTRODUODENOSCOPY (EGD) WITH PROPOFOL;  Surgeon: Rogene Houston, MD;  [facility];  Service: Endoscopy;  Laterality: N/A;  Possible esophageal dilation.  . IR ANGIOGRAM SELECTIVE EACH ADDITIONAL VESSEL  08/01/2018  . IR ANGIOGRAM VISCERAL SELECTIVE  08/01/2018  . IR EMBO ART  VEN HEMORR LYMPH EXTRAV  INC GUIDE ROADMAPPING  08/01/2018  . IR IMAGING GUIDED PORT INSERTION  08/20/2018  . IR PERC PLEURAL DRAIN W/INDWELL CATH W/IMG GUIDE  07/08/2018  . IR THORACENTESIS ASP PLEURAL SPACE W/IMG GUIDE  07/07/2018  . IR US GUIDE VASC ACCESS RIGHT  08/01/2018  . PLEURAL EFFUSION DRAINAGE Left 07/13/2018   Procedure: DRAINAGE OF LOCULATED PLEURAL EFFUSION;  Surgeon: Ivin Poot, MD;  Location: [place];  Service: Thoracic;  Laterality: Left;  . REMOVAL OF PLEURAL DRAINAGE CATHETER Left 08/20/2018   Procedure: REMOVAL OF PLEURAL DRAINAGE CATHETER;  Surgeon: Ivin Poot, MD;  Location: [place];  Service: Thoracic;   Laterality: Left;  . REMOVAL OF PLEURAL DRAINAGE CATHETER Left 08/20/2018   Procedure: REMOVAL OF PLEURAL DRAINAGE CATHETER;  Surgeon: Ivin Poot, MD;  Location: [place];  Service: Thoracic;  Laterality: Left;  . TALC PLEURODESIS Left 07/13/2018   Procedure: Talc Pleuradesis;  Surgeon: Prescott Gum, Collier Salina, MD;  Location: [place];  Service: Thoracic;  Laterality: Left;  . TUBAL LIGATION Bilateral   . UTERINE ABLATION    . VIDEO ASSISTED THORACOSCOPY Left 07/13/2018   Procedure: VIDEO ASSISTED THORACOSCOPY;  Surgeon: Ivin Poot, MD;  [facility]: [facility];  Service: Thoracic;  Laterality: Left;   Social History   Occupational History    Employer: [employer]  Tobacco Use  . Smoking status: Former Smoker    Packs/day: 0.50    Years: 17.00    Pack years: 8.50    Types: Cigarettes    Quit date: 06/22/2018    Years since quitting: 1.0  . Smokeless tobacco: Never Used  Vaping Use  . Vaping Use: Never used  Substance and Sexual Activity  . Alcohol use: Yes    Alcohol/week: 1.0 standard drink    Types: 1 Glasses of wine per week    Comment: Drinks alcohol on occasion  . Drug use: No  . Sexual activity: Not on file

## 2019-07-27 NOTE — Progress Notes (Signed)
Subjective: Patient is here for ultrasound-guided intra-articular left hip injection.     Objective:  Pain with passive IR.  Procedure: Ultrasound-guided left hip injection: After sterile prep with Betadine, injected 8 cc 1% lidocaine without epinephrine and 40 mg methylprednisolone using a 22-gauge spinal needle, passing the needle through the iliofemoral ligament into the femoral head/neck junction.  Injectate seen filling joint capsule.  Good immediate relief.

## 2019-07-30 ENCOUNTER — Other Ambulatory Visit (HOSPITAL_COMMUNITY): Payer: Self-pay | Admitting: Hematology

## 2019-07-30 ENCOUNTER — Other Ambulatory Visit (HOSPITAL_COMMUNITY): Payer: Self-pay | Admitting: *Deleted

## 2019-07-30 DIAGNOSIS — C563 Malignant neoplasm of bilateral ovaries: Secondary | ICD-10-CM

## 2019-08-02 ENCOUNTER — Other Ambulatory Visit (HOSPITAL_COMMUNITY): Payer: Self-pay | Admitting: *Deleted

## 2019-08-02 DIAGNOSIS — C563 Malignant neoplasm of bilateral ovaries: Secondary | ICD-10-CM

## 2019-08-02 MED ORDER — OXYCODONE HCL 10 MG PO TABS: 10.0000 mg | ORAL_TABLET | ORAL | 0 refills | Status: DC | PRN

## 2019-08-25 ENCOUNTER — Other Ambulatory Visit (HOSPITAL_COMMUNITY): Payer: Self-pay

## 2019-08-25 DIAGNOSIS — C563 Malignant neoplasm of bilateral ovaries: Secondary | ICD-10-CM

## 2019-08-25 MED ORDER — OXYCODONE HCL 10 MG PO TABS: 10.0000 mg | ORAL_TABLET | ORAL | 0 refills | Status: DC | PRN

## 2019-09-05 ENCOUNTER — Ambulatory Visit
Admission: EM | Admit: 2019-09-05 | Discharge: 2019-09-05 | Disposition: A | Discharge status: Home / Self Care | Payer: Medicaid Other | Attending: Family Medicine | Admitting: Family Medicine

## 2019-09-05 ENCOUNTER — Other Ambulatory Visit: Payer: Self-pay

## 2019-09-05 DIAGNOSIS — R21 Rash and other nonspecific skin eruption: Secondary | ICD-10-CM

## 2019-09-05 DIAGNOSIS — B029 Zoster without complications: Secondary | ICD-10-CM

## 2019-09-05 MED ORDER — VALACYCLOVIR HCL 1 G PO TABS: 1000.0000 mg | ORAL_TABLET | Freq: Three times a day (TID) | ORAL | 0 refills | Status: DC

## 2019-09-05 MED ORDER — PREDNISONE 10 MG (21) PO TBPK: ORAL_TABLET | Freq: Every day | ORAL | 0 refills | Status: AC

## 2019-09-05 NOTE — Discharge Instructions (Addendum)
I have sent in valtrex for you three times daily for 7 days  I have sent in a prednisone taper for you to take for 6 days. 6 tablets on day one, 5 tablets on day two, 4 tablets on day three, 3 tablets on day four, 2 tablets on day five, and 1 tablet on day six.  Follow up as needed

## 2019-09-05 NOTE — ED Triage Notes (Signed)
Pt presents with rash to left buttock that is painfull for past week

## 2019-09-07 ENCOUNTER — Ambulatory Visit: Payer: Medicaid Other | Admitting: Orthopaedic Surgery

## 2019-09-08 NOTE — ED Provider Notes (Signed)
[place]   [id number] 09/05/19 Arrival Time: 0240  CC: RASH  SUBJECTIVE:  Michelle Holt is a 49 y.o. female who presents with a skin complaint that began about 4 days ago. Reports that the area is red, itchy, and had blisters. Reports that the rash is located to the right buttock.  Denies precipitating event or trauma.  Denies changes in soaps, detergents, close contacts with similar rash, known trigger or environmental trigger, allergy. Denies medications change or starting a new medication recently. Has not tried OTC medications for this.  There are no aggravating or alleviating factors. Denies similar symptoms. Denies ever having shingles. Has not had shingles vaccine. Denies fever, chills, nausea, vomiting, erythema, swelling, discharge, oral lesions, SOB, chest pain, abdominal pain, changes in bowel or bladder function.    ROS: As per HPI.  All other pertinent ROS negative.     Past Medical History:  Diagnosis Date   Anxiety and depression    Arthritis of facet joints at multiple vertebral levels    L5-S1   Constipation    Dyslipidemia    Family history of breast cancer    Family history of uterine cancer    History of kidney stones    Insomnia    Irritable bowel syndrome    Migraine    Muscle tension headache    Neuropathy of finger    Ovarian carcinoma (HCC)    Plantar fasciitis of right foot    Port-A-Cath in place 08/20/2018   Past Surgical History:  Procedure Laterality Date   CHOLECYSTECTOMY  2008   COLONOSCOPY N/A 08/13/2013   Procedure: COLONOSCOPY;  Surgeon: Rogene Houston, MD;  [facility];  Service: Endoscopy;  Laterality: N/A;  230-moved to 145 Ann to notify pt   ESOPHAGOGASTRODUODENOSCOPY     ESOPHAGOGASTRODUODENOSCOPY (EGD) WITH PROPOFOL N/A 07/20/2018   Procedure: ESOPHAGOGASTRODUODENOSCOPY (EGD) WITH PROPOFOL;  Surgeon: Rogene Houston, MD;  [facility];  Service: Endoscopy;  Laterality: N/A;  Possible  esophageal dilation.   IR ANGIOGRAM SELECTIVE EACH ADDITIONAL VESSEL  08/01/2018   IR ANGIOGRAM VISCERAL SELECTIVE  08/01/2018   IR EMBO ART  VEN HEMORR LYMPH EXTRAV  INC GUIDE ROADMAPPING  08/01/2018   IR IMAGING GUIDED PORT INSERTION  08/20/2018   IR PERC PLEURAL DRAIN W/INDWELL CATH W/IMG GUIDE  07/08/2018   IR THORACENTESIS ASP PLEURAL SPACE W/IMG GUIDE  07/07/2018   IR US GUIDE VASC ACCESS RIGHT  08/01/2018   PLEURAL EFFUSION DRAINAGE Left 07/13/2018   Procedure: DRAINAGE OF LOCULATED PLEURAL EFFUSION;  Surgeon: Ivin Poot, MD;  Location: [place];  Service: Thoracic;  Laterality: Left;   REMOVAL OF PLEURAL DRAINAGE CATHETER Left 08/20/2018   Procedure: REMOVAL OF PLEURAL DRAINAGE CATHETER;  Surgeon: Ivin Poot, MD;  Location: [place];  Service: Thoracic;  Laterality: Left;   REMOVAL OF PLEURAL DRAINAGE CATHETER Left 08/20/2018   Procedure: REMOVAL OF PLEURAL DRAINAGE CATHETER;  Surgeon: Ivin Poot, MD;  Location: [place];  Service: Thoracic;  Laterality: Left;   TALC PLEURODESIS Left 07/13/2018   Procedure: Talc Pleuradesis;  Surgeon: Prescott Gum, Collier Salina, MD;  Location: [place];  Service: Thoracic;  Laterality: Left;   TUBAL LIGATION Bilateral    UTERINE ABLATION     VIDEO ASSISTED THORACOSCOPY Left 07/13/2018   Procedure: VIDEO ASSISTED THORACOSCOPY;  Surgeon: Ivin Poot, MD;  Location: [place];  Service: Thoracic;  Laterality: Left;   Allergies  Allergen Reactions   Morphine And Related Itching  Nortriptyline Other (See Comments)    Significant weight gain   Topamax [Topiramate] Diarrhea and Nausea Only   Xanax [Alprazolam]    Actifed Cold-Allergy [Chlorpheniramine-Phenyleph Er] Rash   Amoxicillin Rash    Did it involve swelling of the face/tongue/throat, SOB, or low BP? Unknown Did it involve sudden or severe rash/hives, skin peeling, or any reaction on the inside of your mouth or nose? Unknown Did you need to seek medical attention at a hospital or doctor's  office? Unknown When did it last happen? teenager If all above answers are "NO", may proceed with cephalosporin use.    Codeine Hives   Erythromycin Rash   Penicillins Rash    Did it involve swelling of the face/tongue/throat, SOB, or low BP? Unknown Did it involve sudden or severe rash/hives, skin peeling, or any reaction on the inside of your mouth or nose? Unknown Did you need to seek medical attention at a hospital or doctor's office? Unknown When did it last happen? teenager If all above answers are "NO", may proceed with cephalosporin use.    Sudafed [Pseudoephedrine Hcl] Rash   No current facility-administered medications on file prior to encounter.   Current Outpatient Medications on File Prior to Encounter  Medication Sig Dispense Refill   acetaminophen (TYLENOL) 500 MG tablet Take 1,000 mg by mouth every 6 (six) hours as needed for moderate pain.     Ascorbic Acid (VITAMIN C) 1000 MG tablet Take 1,000 mg by mouth every other day.     bisacodyl (DULCOLAX) 5 MG EC tablet Take 5 mg by mouth 2 (two) times daily.     Cholecalciferol (VITAMIN D) 50 MCG (2000 UT) CAPS Take 2,000 Units by mouth daily.     dexamethasone (DECADRON) 4 MG tablet TAKE ONE TABLET BY MOUTH DAILY. (Patient taking differently: as needed. ) 30 tablet 1   diazepam (VALIUM) 5 MG tablet Take 1 tablet (5 mg total) by mouth at bedtime. 30 tablet 5   dicyclomine (BENTYL) 10 MG capsule Take 1 capsule (10 mg total) by mouth 4 (four) times daily -  before meals and at bedtime. 60 capsule 2   lidocaine-prilocaine (EMLA) cream Apply small amount to port a cath site and cover with plastic wrap one hour prior to chemotherapy appointments 30 g 3   magnesium oxide (MAG-OX) 400 MG tablet Take 400 mg by mouth every other day.     naproxen sodium (ALEVE) 220 MG tablet Take 400 mg by mouth daily as needed.     ondansetron (ZOFRAN-ODT) 8 MG disintegrating tablet TAKE ONE TABLET BY MOUTH EVERY 8 HOURS AS  NEEDED FOR NAUSEA AND VOMITING. 30 tablet 2   Oxycodone HCl 10 MG TABS Take 1 tablet (10 mg total) by mouth every 4 (four) hours as needed. 168 tablet 0   pantoprazole (PROTONIX) 40 MG tablet TAKE ONE TABLET BY MOUTH TWICE DAILY BEFORE A MEAL. 60 tablet 2   polyethylene glycol (MIRALAX / GLYCOLAX) 17 g packet Take 17 g by mouth daily as needed for moderate constipation.      senna (SENOKOT) 8.6 MG TABS tablet Take 1 tablet (8.6 mg total) by mouth daily as needed for mild constipation or moderate constipation. 120 tablet 0   SUMAtriptan (IMITREX) 100 MG tablet TAKE ONE TABLET BY MOUTH PRN UP to TWICE DAILY AS NEEDED. 9 tablet 5   triamcinolone cream (KENALOG) 0.1 % Apply 1 application topically 2 (two) times daily. 15 g 2   venlafaxine XR (EFFEXOR-XR) 150 MG 24 hr capsule  TAKE ONE CAPSULE BY MOUTH DAILY WITH BREAKFAST (Patient taking differently: at bedtime. ) 30 capsule 3   Social History   Socioeconomic History   Marital status: Widowed    Spouse name: Not on file   Number of children: 2   Years of education: 2-College   Highest education level: Not on file  Occupational History    Employer: [employer]  Tobacco Use   Smoking status: Former Smoker    Packs/day: 0.50    Years: 17.00    Pack years: 8.50    Types: Cigarettes    Quit date: 06/22/2018    Years since quitting: 1.2   Smokeless tobacco: Never Used  Vaping Use   Vaping Use: Never used  Substance and Sexual Activity   Alcohol use: Yes    Alcohol/week: 1.0 standard drink    Types: 1 Glasses of wine per week    Comment: Drinks alcohol on occasion   Drug use: No   Sexual activity: Not on file  Other Topics Concern   Not on file  Social History Narrative   Patient lives at home with her daughter.    Patient has 2 children.    Patient is widowed.    Patient is right handed.    Patient has her Associates degree.      Social Determinants of Health   Financial Resource Strain:    Difficulty of Paying  Living Expenses: Not on file  Food Insecurity:    Worried About Charity fundraiser in the Last Year: Not on file   [employer] of Food in the Last Year: Not on file  Transportation Needs:    Lack of Transportation (Medical): Not on file   Lack of Transportation (Non-Medical): Not on file  Physical Activity:    Days of Exercise per Week: Not on file   Minutes of Exercise per Session: Not on file  Stress:    Feeling of Stress : Not on file  Social Connections:    Frequency of Communication with Friends and Family: Not on file   Frequency of Social Gatherings with Friends and Family: Not on file   Attends Religious Services: Not on file   Active Member of Clubs or Organizations: Not on file   Attends Archivist Meetings: Not on file   Marital Status: Not on file  Intimate Partner Violence:    Fear of Current or Ex-Partner: Not on file   Emotionally Abused: Not on file   Physically Abused: Not on file   Sexually Abused: Not on file   Family History  Problem Relation Age of Onset   Hypertension Mother    Obesity Mother    Diabetes Mother    Kidney disease Mother    Peripheral vascular disease Father    Atrial fibrillation Father    COPD Brother    Osteoporosis Brother    Crohn's disease Sister    Uterine cancer Sister 4       maternal half sister   Breast cancer Maternal Aunt 50   Colon cancer Neg Hx     OBJECTIVE: Vitals:   09/05/19 1417  BP: 113/76  Pulse: 81  Resp: 20  Temp: 98.5 F (36.9 C)  SpO2: 96%    General appearance: alert; no distress Head: NCAT Lungs: clear to auscultation bilaterally Heart: regular rate and rhythm.  Radial pulse 2+ bilaterally Extremities: no edema Skin: warm and dry; erythematous vesicular rash to the right buttock, appears to follow dermatome, consistent with shingles  Psychological: alert and cooperative; normal mood and affect  ASSESSMENT & PLAN:  1. Herpes zoster without complication   2.  Rash and nonspecific skin eruption     Meds ordered this encounter  Medications   valACYclovir (VALTREX) 1000 MG tablet    Sig: Take 1 tablet (1,000 mg total) by mouth 3 (three) times daily.    Dispense:  21 tablet    Refill:  0    Order Specific Question:   Supervising Provider    Answer:   Chase Picket [[id number]]   predniSONE (STERAPRED UNI-PAK 21 TAB) 10 MG (21) TBPK tablet    Sig: Take by mouth daily for 6 days. Take 6 tablets on day 1, 5 tablets on day 2, 4 tablets on day 3, 3 tablets on day 4, 2 tablets on day 5, 1 tablet on day 6    Dispense:  21 tablet    Refill:  0    Order Specific Question:   Supervising Provider    Answer:   Chase Picket [[id number]]   Prescribed valacyclovir Prescribed prednisone taper Take as prescribed and to completion Avoid hot showers/ baths Moisturize skin daily  Follow up with PCP if symptoms persists Return or go to the ER if you have any new or worsening symptoms such as fever, chills, nausea, vomiting, redness, swelling, discharge, if symptoms do not improve with medications  Reviewed expectations re: course of current medical issues. Questions answered. Outlined signs and symptoms indicating need for more acute intervention. Patient verbalized understanding. After Visit Summary given.   Faustino Congress, NP 09/08/19 1057

## 2019-09-22 ENCOUNTER — Other Ambulatory Visit (HOSPITAL_COMMUNITY): Payer: Self-pay

## 2019-09-22 DIAGNOSIS — C563 Malignant neoplasm of bilateral ovaries: Secondary | ICD-10-CM

## 2019-09-22 MED ORDER — OXYCODONE HCL 10 MG PO TABS: 10.0000 mg | ORAL_TABLET | ORAL | 0 refills | Status: DC | PRN

## 2019-09-23 ENCOUNTER — Other Ambulatory Visit (HOSPITAL_COMMUNITY): Payer: Medicaid Other

## 2019-09-23 ENCOUNTER — Ambulatory Visit (HOSPITAL_COMMUNITY): Payer: Medicaid Other

## 2019-09-27 ENCOUNTER — Ambulatory Visit (HOSPITAL_COMMUNITY): Payer: Medicaid Other | Admitting: Hematology

## 2019-09-30 ENCOUNTER — Other Ambulatory Visit (HOSPITAL_COMMUNITY): Payer: Self-pay | Admitting: Nurse Practitioner

## 2019-10-07 ENCOUNTER — Encounter (HOSPITAL_COMMUNITY): Payer: Self-pay

## 2019-10-07 ENCOUNTER — Other Ambulatory Visit: Payer: Self-pay

## 2019-10-07 ENCOUNTER — Inpatient Hospital Stay (HOSPITAL_COMMUNITY): Payer: Medicaid Other | Attending: Hematology

## 2019-10-07 DIAGNOSIS — Z8049 Family history of malignant neoplasm of other genital organs: Secondary | ICD-10-CM | POA: Insufficient documentation

## 2019-10-07 DIAGNOSIS — Z79899 Other long term (current) drug therapy: Secondary | ICD-10-CM | POA: Insufficient documentation

## 2019-10-07 DIAGNOSIS — Z9079 Acquired absence of other genital organ(s): Secondary | ICD-10-CM | POA: Insufficient documentation

## 2019-10-07 DIAGNOSIS — Z8543 Personal history of malignant neoplasm of ovary: Secondary | ICD-10-CM | POA: Diagnosis present

## 2019-10-07 DIAGNOSIS — Z90710 Acquired absence of both cervix and uterus: Secondary | ICD-10-CM | POA: Diagnosis not present

## 2019-10-07 DIAGNOSIS — Z90722 Acquired absence of ovaries, bilateral: Secondary | ICD-10-CM | POA: Insufficient documentation

## 2019-10-07 DIAGNOSIS — Z9221 Personal history of antineoplastic chemotherapy: Secondary | ICD-10-CM | POA: Diagnosis not present

## 2019-10-07 DIAGNOSIS — Z87891 Personal history of nicotine dependence: Secondary | ICD-10-CM | POA: Diagnosis not present

## 2019-10-07 DIAGNOSIS — F418 Other specified anxiety disorders: Secondary | ICD-10-CM | POA: Diagnosis not present

## 2019-10-07 DIAGNOSIS — Z803 Family history of malignant neoplasm of breast: Secondary | ICD-10-CM | POA: Diagnosis not present

## 2019-10-07 DIAGNOSIS — C563 Malignant neoplasm of bilateral ovaries: Secondary | ICD-10-CM

## 2019-10-07 DIAGNOSIS — G629 Polyneuropathy, unspecified: Secondary | ICD-10-CM | POA: Insufficient documentation

## 2019-10-07 LAB — COMPREHENSIVE METABOLIC PANEL
ALT: 18 U/L (ref 0–44)
AST: 17 U/L (ref 15–41)
Albumin: 3.9 g/dL (ref 3.5–5.0)
Alkaline Phosphatase: 70 U/L (ref 38–126)
Anion gap: 7 (ref 5–15)
BUN: 16 mg/dL (ref 6–20)
CO2: 27 mmol/L (ref 22–32)
Calcium: 9.4 mg/dL (ref 8.9–10.3)
Chloride: 104 mmol/L (ref 98–111)
Creatinine, Ser: 0.56 mg/dL (ref 0.44–1.00)
GFR calc Af Amer: >60 mL/min (ref >60)
GFR calc non Af Amer: >60 mL/min (ref >60)
Glucose, Bld: 107 mg/dL — ABNORMAL HIGH (ref 70–99)
Potassium: 3.9 mmol/L (ref 3.5–5.1)
Sodium: 138 mmol/L (ref 135–145)
Total Bilirubin: 0.6 mg/dL (ref 0.3–1.2)
Total Protein: 6.8 g/dL (ref 6.5–8.1)

## 2019-10-07 LAB — CBC WITH DIFFERENTIAL/PLATELET
Abs Immature Granulocytes: 0.02 10*3/uL (ref 0.00–0.07)
Basophils Absolute: 0 10*3/uL (ref 0.0–0.1)
Basophils Relative: 0 %
Eosinophils Absolute: 0.2 10*3/uL (ref 0.0–0.5)
Eosinophils Relative: 3 %
HCT: 39.2 % (ref 36.0–46.0)
Hemoglobin: 12.9 g/dL (ref 12.0–15.0)
Immature Granulocytes: 0 %
Lymphocytes Relative: 28 %
Lymphs Abs: 1.9 10*3/uL (ref 0.7–4.0)
MCH: 35.9 pg — ABNORMAL HIGH (ref 26.0–34.0)
MCHC: 32.9 g/dL (ref 30.0–36.0)
MCV: 109.2 fL — ABNORMAL HIGH (ref 80.0–100.0)
Monocytes Absolute: 0.5 10*3/uL (ref 0.1–1.0)
Monocytes Relative: 7 %
Neutro Abs: 4.2 10*3/uL (ref 1.7–7.7)
Neutrophils Relative %: 62 %
Platelets: 347 10*3/uL (ref 150–400)
RBC: 3.59 MIL/uL — ABNORMAL LOW (ref 3.87–5.11)
RDW: 12.7 % (ref 11.5–15.5)
WBC: 6.7 10*3/uL (ref 4.0–10.5)
nRBC: 0 % (ref 0.0–0.2)

## 2019-10-07 LAB — LIPID PANEL
Cholesterol: 255 mg/dL — ABNORMAL HIGH (ref 0–200)
HDL: 79 mg/dL (ref >40)
LDL Cholesterol: 156 mg/dL — ABNORMAL HIGH (ref 0–99)
Total CHOL/HDL Ratio: 3.2 RATIO
Triglycerides: 102 mg/dL (ref <150)
VLDL: 20 mg/dL (ref 0–40)

## 2019-10-07 MED ORDER — HEPARIN SOD (PORK) LOCK FLUSH 100 UNIT/ML IV SOLN
500.0000 [IU] | Freq: Once | INTRAVENOUS | Status: AC
  Administered 2019-10-07: 500 [IU] via INTRAVENOUS

## 2019-10-07 MED ORDER — SODIUM CHLORIDE 0.9% FLUSH
20.0000 mL | INTRAVENOUS | Status: DC | PRN
  Administered 2019-10-07: 20 mL via INTRAVENOUS

## 2019-10-07 NOTE — Patient Instructions (Signed)
[facility] at [facility] Discharge Instructions  Labs drawn from portacath today. Follow-up as scheduled   Thank you for choosing  [facility] Center at [facility] to provide your oncology and hematology care.  To afford each patient quality time with our provider, please arrive at least 15 minutes before your scheduled appointment time.   If you have a lab appointment with the Cancer Center please come in thru the Main Entrance and check in at the main information desk.  You need to re-schedule your appointment should you arrive 10 or more minutes late.  We strive to give you quality time with our providers, and arriving late affects you and other patients whose appointments are after yours.  Also, if you no show three or more times for appointments you may be dismissed from the clinic at the providers discretion.     Again, thank you for choosing [facility].  Our hope is that these requests will decrease the amount of time that you wait before being seen by our physicians.       _____________________________________________________________  Should you have questions after your visit to [facility], please contact our office at [phone] and follow the prompts.  Our office hours are 8:00 a.m. and 4:30 p.m. Monday - Friday.  Please note that voicemails left after 4:00 p.m. may not be returned until the following business day.  We are closed weekends and major holidays.  You do have access to a nurse 24-7, just call the main number to the clinic [phone] and do not press any options, hold on the line and a nurse will answer the phone.    For prescription refill requests, have your pharmacy contact our office and allow 72 hours.    Due to Covid, you will need to wear a mask upon entering the hospital. If you do not have a mask, a mask will be given to you at the Main Entrance upon arrival. For doctor visits, patients may have  1 support person age 18 or older with them. For treatment visits, patients can not have anyone with them due to social distancing guidelines and our immunocompromised population.     

## 2019-10-07 NOTE — Progress Notes (Signed)
Michelle Holt tolerated portacath lab draw with flush well without complaints or incident. Port accessed with 20 gauge needle with blood drawn for labs ordered then flushed easiy per protocol and de-accessed. VSS Pt discharged self ambulatory in satisfactory condition

## 2019-10-08 LAB — RHEUMATOID FACTOR: Rheumatoid fact SerPl-aCnc: <10 IU/mL (ref 0.0–13.9)

## 2019-10-08 LAB — CA 125: Cancer Antigen (CA) 125: 5 U/mL (ref 0.0–38.1)

## 2019-10-13 ENCOUNTER — Other Ambulatory Visit (HOSPITAL_COMMUNITY): Payer: Self-pay | Admitting: Obstetrics & Gynecology

## 2019-10-13 ENCOUNTER — Ambulatory Visit (HOSPITAL_COMMUNITY)
Admission: RE | Admit: 2019-10-13 | Discharge: 2019-10-13 | Disposition: A | Discharge status: Home / Self Care | Payer: Medicaid Other | Source: Ambulatory Visit | Attending: Hematology | Admitting: Hematology

## 2019-10-13 ENCOUNTER — Other Ambulatory Visit: Payer: Self-pay

## 2019-10-13 DIAGNOSIS — C562 Malignant neoplasm of left ovary: Secondary | ICD-10-CM | POA: Insufficient documentation

## 2019-10-13 DIAGNOSIS — C561 Malignant neoplasm of right ovary: Secondary | ICD-10-CM | POA: Diagnosis not present

## 2019-10-13 DIAGNOSIS — Z1231 Encounter for screening mammogram for malignant neoplasm of breast: Secondary | ICD-10-CM

## 2019-10-13 DIAGNOSIS — C563 Malignant neoplasm of bilateral ovaries: Secondary | ICD-10-CM

## 2019-10-13 MED ORDER — IOHEXOL 300 MG/ML  SOLN
100.0000 mL | Freq: Once | INTRAMUSCULAR | Status: AC | PRN
  Administered 2019-10-13: 100 mL via INTRAVENOUS

## 2019-10-18 ENCOUNTER — Inpatient Hospital Stay (HOSPITAL_COMMUNITY): Payer: Medicaid Other | Attending: Hematology | Admitting: Hematology

## 2019-10-18 ENCOUNTER — Other Ambulatory Visit: Payer: Self-pay

## 2019-10-18 VITALS — BP 104/69 | HR 78 | Temp 97.1°F | Resp 17 | Wt 173.4 lb

## 2019-10-18 DIAGNOSIS — N951 Menopausal and female climacteric states: Secondary | ICD-10-CM | POA: Diagnosis not present

## 2019-10-18 DIAGNOSIS — J449 Chronic obstructive pulmonary disease, unspecified: Secondary | ICD-10-CM | POA: Insufficient documentation

## 2019-10-18 DIAGNOSIS — I4891 Unspecified atrial fibrillation: Secondary | ICD-10-CM | POA: Insufficient documentation

## 2019-10-18 DIAGNOSIS — Z8049 Family history of malignant neoplasm of other genital organs: Secondary | ICD-10-CM | POA: Diagnosis not present

## 2019-10-18 DIAGNOSIS — Z8249 Family history of ischemic heart disease and other diseases of the circulatory system: Secondary | ICD-10-CM | POA: Diagnosis not present

## 2019-10-18 DIAGNOSIS — Z90722 Acquired absence of ovaries, bilateral: Secondary | ICD-10-CM | POA: Diagnosis not present

## 2019-10-18 DIAGNOSIS — Z9079 Acquired absence of other genital organ(s): Secondary | ICD-10-CM | POA: Insufficient documentation

## 2019-10-18 DIAGNOSIS — Z7901 Long term (current) use of anticoagulants: Secondary | ICD-10-CM | POA: Insufficient documentation

## 2019-10-18 DIAGNOSIS — Z803 Family history of malignant neoplasm of breast: Secondary | ICD-10-CM | POA: Insufficient documentation

## 2019-10-18 DIAGNOSIS — Z79899 Other long term (current) drug therapy: Secondary | ICD-10-CM | POA: Insufficient documentation

## 2019-10-18 DIAGNOSIS — I10 Essential (primary) hypertension: Secondary | ICD-10-CM | POA: Insufficient documentation

## 2019-10-18 DIAGNOSIS — Z8543 Personal history of malignant neoplasm of ovary: Secondary | ICD-10-CM | POA: Insufficient documentation

## 2019-10-18 DIAGNOSIS — R1012 Left upper quadrant pain: Secondary | ICD-10-CM | POA: Insufficient documentation

## 2019-10-18 DIAGNOSIS — C563 Malignant neoplasm of bilateral ovaries: Secondary | ICD-10-CM

## 2019-10-18 DIAGNOSIS — G629 Polyneuropathy, unspecified: Secondary | ICD-10-CM | POA: Insufficient documentation

## 2019-10-18 DIAGNOSIS — Z90710 Acquired absence of both cervix and uterus: Secondary | ICD-10-CM | POA: Diagnosis not present

## 2019-10-18 DIAGNOSIS — Z833 Family history of diabetes mellitus: Secondary | ICD-10-CM | POA: Diagnosis not present

## 2019-10-18 NOTE — Patient Instructions (Addendum)
[place] at [facility] Discharge Instructions  You were seen today by Dr. Delton Coombes. He went over your recent results and scans. Continue taking gabapentin for the burning sensation in your hands. Dr. Delton Coombes will see you back in 3 months for labs and follow up.   Thank you for choosing [place] at [facility] to provide your oncology and hematology care.  To afford each patient quality time with our provider, please arrive at least 15 minutes before your scheduled appointment time.   If you have a lab appointment with the [facility] please come in thru the Main Entrance and check in at the main information desk  You need to re-schedule your appointment should you arrive 10 or more minutes late.  We strive to give you quality time with our providers, and arriving late affects you and other patients whose appointments are after yours.  Also, if you no show three or more times for appointments you may be dismissed from the clinic at the providers discretion.     Again, thank you for choosing [facility].  Our hope is that these requests will decrease the amount of time that you wait before being seen by our physicians.       _____________________________________________________________  Should you have questions after your visit to [facility], please contact our office at [phone] between the hours of 8:00 a.m. and 4:30 p.m.  Voicemails left after 4:00 p.m. will not be returned until the following business day.  For prescription refill requests, have your pharmacy contact our office and allow 72 hours.    Cancer Center Support Programs:   > Cancer Support Group  2nd Tuesday of the month 1pm-2pm, Journey Room

## 2019-10-18 NOTE — Progress Notes (Signed)
Michelle Holt, [place] [address]   CLINIC:  Medical Oncology/Hematology  PCP:  [name], MD [address] / [place] [address] [phone]   REASON FOR VISIT:  Follow-up for high-grade serous ovarian cancer  PRIOR THERAPY:  1. Carboplatin and paclitaxel x 7 cycles from 08/24/2018 to 03/15/2019. 2. Laparoscopic TAH & BSO & omenectomy on 12/24/2018.  NGS Results: Not done  CURRENT THERAPY: Observation  BRIEF ONCOLOGIC HISTORY:  Oncology History  Ovarian cancer, bilateral  07/07/2018 Pathology Results   PLEURAL FLUID, LEFT (SPECIMEN 1 OF 1, COLLECTED 07/07/18): - MALIGNANT CELLS CONSISTENT WITH ADENOCARCINOMA - SEE COMMENT  Source Pleural Fluid, (Specimen 1 of 1, collected on 07/07/2018) Gross Specimen: Received is/are 1000cc of bloody red fluid with tissue. (TC:tc) Prepared: # Smears: 0 # Concentration Technique Slides (i.e. ThinPrep): 1 # Cell Block: 1 Conventional Additional Studies: Two Hematology slides labeled [id number] Comment The malignant cells are positive for cytokeratin 7, p53, WT-1, Pax-8, Moc31, ER (weak) and EMA but negative for cytokeratin 20, TTF-1, GATA-3, CDX-2 and D2-40. Overall, the phenotype is consistent with a gynecologic primary; clinical correlation recommended.   07/07/2018 Procedure   Successful ultrasound guided left thoracentesis yielding 2.0 L of pleural fluid   07/08/2018 Procedure   1. Technically successful placement of left 14 French pigtail chest drain, placed to Pleur-evac water-seal.   07/08/2018 Procedure   1. Technically successful five [place] double lumen power injectable PICC placement   07/09/2018 Imaging   Ct chest 1. There is a moderate, loculated left hydropneumothorax with a small air component and moderate fluid component. The largest loculated component is located posteriorly. There is a pigtail drainage catheter about the lateral pleural space. There is no obvious etiology, such as obvious  mass or pleural disease.   2. There is a small right pleural effusion with associated atelectasis or consolidation and a subpleural consolidation of the superior segment right lower lobe (series 4, image 56), of uncertain significance, possibly infectious or inflammatory   07/10/2018 Imaging   Ct abdomen and pelvis: 1. The bilateral ovaries are enlarged by heterogeneous appearing cystic lesions, measuring 5.3 x 4.2 cm on the right (series 4, image 72) and 4.5 x 3.2 cm on the left (series 4, image 75). Consider dedicated pelvic ultrasound and/or pelvic MRI to further evaluate for solid components given high suspicion for GYN primary malignancy.   2. No other evidence of mass and no lymphadenopathy in the abdomen or pelvis.   3. Trace ascites. There is some suggestion of omental and peritoneal nodularity (e.g. Series 4, image 82), concerning for peritoneal metastatic disease.    4. Loculated left-sided pleural effusion with left-sided pleural drainage catheter in position. Small right pleural effusion   07/13/2018 Surgery   OPERATION: 1.  Left VATS (video-assisted thoracoscopic surgery) for drainage of loculated pleural effusion. 2.  Talc pleurodesis for malignant pleural effusion. 3.  Placement of PleurX catheter for management of malignant pleural effusion. 4.  Placement of On-Q analgesia catheter system.    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   07/13/2018 Pathology Results   Pleura, peel, Left Pleural - FIBRO-FIBRINOUS PLEURITIS - NEGATIVE FOR MALIGNANCY   07/18/2018 Initial Diagnosis   Ovarian cancer, bilateral ([place])   07/20/2018 Procedure   EGD impression: Normal proximal esophagus and mid esophagus. Mild distal esophageal  rings; dilation not performed because of esophagitis. LA Grade C  reflux esophagitis. Z-line regular, 30 cm from the incisors. 5 cm hiatal hernia. Non-bleeding gastric ulcer with no stigmata of bleeding. Gastritis. Duodenal erosions without bleeding. Normal second portion of the duodenum. No specimens collected.   08/19/2018 Genetic Testing   Negative genetic testing on the common hereditary cancer panel.  The Common Hereditary Gene Panel offered by [facility] includes sequencing and/or deletion duplication testing of the following 48 genes: APC, ATM, AXIN2, BARD1, BMPR1A, BRCA1, BRCA2, BRIP1, CDH1, CDK4, CDKN2A (p14ARF), CDKN2A (p16INK4a), CHEK2, CTNNA1, DICER1, EPCAM (Deletion/duplication testing only), GREM1 (promoter region deletion/duplication testing only), KIT, MEN1, MLH1, MSH2, MSH3, MSH6, MUTYH, NBN, NF1, NHTL1, PALB2, PDGFRA, PMS2, POLD1, POLE, PTEN, RAD50, RAD51C, RAD51D, RNF43, SDHB, SDHC, SDHD, SMAD4, SMARCA4. STK11, TP53, TSC1, TSC2, and VHL.  The following genes were evaluated for sequence changes only: SDHA and HOXB13 c.251G>A variant only. The report date is August 19, 2018.   08/24/2018 -  Chemotherapy   The patient had palonosetron (ALOXI) injection 0.25 mg, 0.25 mg, Intravenous,  Once, 7 of 7 cycles Administration: 0.25 mg (08/24/2018), 0.25 mg (09/14/2018), 0.25 mg (10/05/2018), 0.25 mg (12/01/2018), 0.25 mg (01/27/2019), 0.25 mg (02/22/2019), 0.25 mg (03/15/2019) CARBOplatin (PARAPLATIN) 740 mg in sodium chloride 0.9 % 250 mL chemo infusion, 740 mg (100 % of original dose 742.8 mg), Intravenous,  Once, 7 of 7 cycles Dose modification:   (original dose 742.8 mg, Cycle 1),   (original dose 736.8 mg, Cycle 2),   (original dose 736.8 mg, Cycle 3),   (original dose 736.8 mg, Cycle 4),   (original dose 736.8 mg, Cycle 5),   (original dose 736.8 mg, Cycle 6),   (original dose 736.8 mg, Cycle 7) Administration: 740 mg (08/24/2018), 740 mg (09/14/2018), 740 mg (10/05/2018), 740 mg (12/01/2018), 740 mg (01/27/2019), 740 mg (02/22/2019), 740 mg (03/15/2019) PACLitaxel  (TAXOL) 324 mg in sodium chloride 0.9 % 500 mL chemo infusion (> 26m/m2), 175 mg/m2 = 324 mg, Intravenous,  Once, 7 of 7 cycles Administration: 324 mg (08/24/2018), 324 mg (09/14/2018), 324 mg (10/05/2018), 324 mg (12/01/2018), 324 mg (01/27/2019), 324 mg (02/22/2019), 324 mg (03/15/2019) fosaprepitant (EMEND) 150 mg, dexamethasone (DECADRON) 12 mg in sodium chloride 0.9 % 145 mL IVPB, , Intravenous,  Once, 7 of 7 cycles Administration:  (08/24/2018),  (09/14/2018),  (10/05/2018),  (12/01/2018),  (01/27/2019),  (02/22/2019),  (03/15/2019)  for chemotherapy treatment.      CANCER STAGING: Cancer Staging No matching staging information was found for the patient.  INTERVAL HISTORY:  [name] a 49y.o. female, returns for routine follow-up of her high-grade serous ovarian cancer. A[place]aminawas last seen on 06/23/2019.  Today she reports feeling well. She denies having abdominal pain, though she gets occasional abdominal cramping in certain positions. She continues having left hip pain. She continues taking oxycodone every 4 hours. She has numbness and burning in her hands when she sleeps; she stopped taking gabapentin. She continues having hot flashes, though they are better controlled with Effexor.   REVIEW OF SYSTEMS:  Review of Systems  Constitutional: Positive for fatigue (80%). Negative for appetite change.  Gastrointestinal: Negative for abdominal pain.  Endocrine: Positive for hot flashes (improving).  Musculoskeletal: Positive for arthralgias (8/10 L hip pain).  Neurological: Positive for numbness (& burning in hands at night).    PAST MEDICAL/SURGICAL HISTORY:  Past Medical History:  Diagnosis Date  . Anxiety and depression   . Arthritis of facet joints at multiple vertebral levels  L5-S1  . Constipation   . Dyslipidemia   . Family history of breast cancer   . Family history of uterine cancer   . History of kidney stones   . Insomnia   . Irritable bowel syndrome   . Migraine     . Muscle tension headache   . Neuropathy of finger   . Ovarian carcinoma ([place])   . Plantar fasciitis of right foot   . Port-A-Cath in place 08/20/2018   Past Surgical History:  Procedure Laterality Date  . CHOLECYSTECTOMY  2008  . COLONOSCOPY N/A 08/13/2013   Procedure: COLONOSCOPY;  Surgeon: Rogene Houston, MD;  [facility];  Service: Endoscopy;  Laterality: N/A;  230-moved to 145 Ann to notify pt  . ESOPHAGOGASTRODUODENOSCOPY    . ESOPHAGOGASTRODUODENOSCOPY (EGD) WITH PROPOFOL N/A 07/20/2018   Procedure: ESOPHAGOGASTRODUODENOSCOPY (EGD) WITH PROPOFOL;  Surgeon: Rogene Houston, MD;  [facility];  Service: Endoscopy;  Laterality: N/A;  Possible esophageal dilation.  . IR ANGIOGRAM SELECTIVE EACH ADDITIONAL VESSEL  08/01/2018  . IR ANGIOGRAM VISCERAL SELECTIVE  08/01/2018  . IR EMBO ART  VEN HEMORR LYMPH EXTRAV  INC GUIDE ROADMAPPING  08/01/2018  . IR IMAGING GUIDED PORT INSERTION  08/20/2018  . IR PERC PLEURAL DRAIN W/INDWELL CATH W/IMG GUIDE  07/08/2018  . IR THORACENTESIS ASP PLEURAL SPACE W/IMG GUIDE  07/07/2018  . IR US GUIDE VASC ACCESS RIGHT  08/01/2018  . PLEURAL EFFUSION DRAINAGE Left 07/13/2018   Procedure: DRAINAGE OF LOCULATED PLEURAL EFFUSION;  Surgeon: Ivin Poot, MD;  Location: [facility];  Service: Thoracic;  Laterality: Left;  . REMOVAL OF PLEURAL DRAINAGE CATHETER Left 08/20/2018   Procedure: REMOVAL OF PLEURAL DRAINAGE CATHETER;  Surgeon: Ivin Poot, MD;  Location: [facility];  Service: Thoracic;  Laterality: Left;  . REMOVAL OF PLEURAL DRAINAGE CATHETER Left 08/20/2018   Procedure: REMOVAL OF PLEURAL DRAINAGE CATHETER;  Surgeon: Ivin Poot, MD;  Location: [facility];  Service: Thoracic;  Laterality: Left;  . TALC PLEURODESIS Left 07/13/2018   Procedure: Talc Pleuradesis;  Surgeon: Prescott Gum, Collier Salina, MD;  Location: [facility];  Service: Thoracic;  Laterality: Left;  . TUBAL LIGATION Bilateral   . UTERINE ABLATION    . VIDEO ASSISTED THORACOSCOPY Left 07/13/2018    Procedure: VIDEO ASSISTED THORACOSCOPY;  Surgeon: Ivin Poot, MD;  Location: [facility] OR;  Service: Thoracic;  Laterality: Left;    SOCIAL HISTORY:  Social History   Socioeconomic History  . Marital status: Widowed    Spouse name: Not on file  . Number of children: 2  . Years of education: 2-College  . Highest education level: Not on file  Occupational History    Employer: [employer]  Tobacco Use  . Smoking status: Former Smoker    Packs/day: 0.50    Years: 17.00    Pack years: 8.50    Types: Cigarettes    Quit date: 06/22/2018    Years since quitting: 1.3  . Smokeless tobacco: Never Used  Vaping Use  . Vaping Use: Never used  Substance and Sexual Activity  . Alcohol use: Yes    Alcohol/week: 1.0 standard drink    Types: 1 Glasses of wine per week    Comment: Drinks alcohol on occasion  . Drug use: No  . Sexual activity: Not on file  Other Topics Concern  . Not on file  Social History Narrative   Patient lives at home with her daughter.    Patient has 2 children.  Patient is widowed.    Patient is right handed.    Patient has her Associates degree.      Social Determinants of Health   Financial Resource Strain:   . Difficulty of Paying Living Expenses: Not on file  Food Insecurity:   . Worried About Charity fundraiser in the Last Year: Not on file  . Ran Out of Food in the Last Year: Not on file  Transportation Needs:   . Lack of Transportation (Medical): Not on file  . Lack of Transportation (Non-Medical): Not on file  Physical Activity:   . Days of Exercise per Week: Not on file  . Minutes of Exercise per Session: Not on file  Stress:   . Feeling of Stress : Not on file  Social Connections:   . Frequency of Communication with Friends and Family: Not on file  . Frequency of Social Gatherings with Friends and Family: Not on file  . Attends Religious Services: Not on file  . Active Member of Clubs or Organizations: Not on file  . Attends Theatre manager Meetings: Not on file  . Marital Status: Not on file  Intimate Partner Violence:   . Fear of Current or Ex-Partner: Not on file  . Emotionally Abused: Not on file  . Physically Abused: Not on file  . Sexually Abused: Not on file    FAMILY HISTORY:  Family History  Problem Relation Age of Onset  . Hypertension Mother   . Obesity Mother   . Diabetes Mother   . Kidney disease Mother   . Peripheral vascular disease Father   . Atrial fibrillation Father   . COPD Brother   . Osteoporosis Brother   . Crohn's disease Sister   . Uterine cancer Sister 70       maternal half sister  . Breast cancer Maternal Aunt 75  . Colon cancer Neg Hx     CURRENT MEDICATIONS:  Current Outpatient Medications  Medication Sig Dispense Refill  . acetaminophen (TYLENOL) 500 MG tablet Take 1,000 mg by mouth every 6 (six) hours as needed for moderate pain.    . Ascorbic Acid (VITAMIN C) 1000 MG tablet Take 1,000 mg by mouth every other day.    . bisacodyl (DULCOLAX) 5 MG EC tablet Take 5 mg by mouth 2 (two) times daily.    . Cholecalciferol (VITAMIN D) 50 MCG (2000 UT) CAPS Take 2,000 Units by mouth daily.    Marland Kitchen dexamethasone (DECADRON) 4 MG tablet TAKE ONE TABLET BY MOUTH DAILY. (Patient taking differently: as needed. ) 30 tablet 1  . diazepam (VALIUM) 5 MG tablet Take 1 tablet (5 mg total) by mouth at bedtime. 30 tablet 5  . dicyclomine (BENTYL) 10 MG capsule Take 1 capsule (10 mg total) by mouth 4 (four) times daily -  before meals and at bedtime. 60 capsule 2  . lidocaine-prilocaine (EMLA) cream Apply small amount to port a cath site and cover with plastic wrap one hour prior to chemotherapy appointments 30 g 3  . magnesium oxide (MAG-OX) 400 MG tablet Take 400 mg by mouth every other day.    . naproxen sodium (ALEVE) 220 MG tablet Take 400 mg by mouth daily as needed.    . Oxycodone HCl 10 MG TABS Take 1 tablet (10 mg total) by mouth every 4 (four) hours as needed. 168 tablet 0  .  pantoprazole (PROTONIX) 40 MG tablet TAKE ONE TABLET BY MOUTH TWICE DAILY BEFORE A MEAL 60 tablet 2  .  polyethylene glycol (MIRALAX / GLYCOLAX) 17 g packet Take 17 g by mouth daily as needed for moderate constipation.     . senna (SENOKOT) 8.6 MG TABS tablet Take 1 tablet (8.6 mg total) by mouth daily as needed for mild constipation or moderate constipation. 120 tablet 0  . SUMAtriptan (IMITREX) 100 MG tablet TAKE ONE TABLET BY MOUTH PRN UP to TWICE DAILY AS NEEDED. 9 tablet 5  . triamcinolone cream (KENALOG) 0.1 % Apply 1 application topically 2 (two) times daily. 15 g 2  . valACYclovir (VALTREX) 1000 MG tablet Take 1 tablet (1,000 mg total) by mouth 3 (three) times daily. 21 tablet 0  . venlafaxine XR (EFFEXOR-XR) 150 MG 24 hr capsule TAKE ONE CAPSULE BY MOUTH DAILY WITH BREAKFAST (Patient taking differently: at bedtime. ) 30 capsule 3  . ondansetron (ZOFRAN-ODT) 8 MG disintegrating tablet TAKE ONE TABLET BY MOUTH EVERY 8 HOURS AS NEEDED FOR NAUSEA AND VOMITING. (Patient not taking: Reported on 10/18/2019) 30 tablet 2   No current facility-administered medications for this visit.    ALLERGIES:  Allergies  Allergen Reactions  . Morphine And Related Itching  . Nortriptyline Other (See Comments)    Significant weight gain  . Topamax [Topiramate] Diarrhea and Nausea Only  . Xanax [Alprazolam]   . Actifed Cold-Allergy [Chlorpheniramine-Phenyleph Er] Rash  . Amoxicillin Rash    Did it involve swelling of the face/tongue/throat, SOB, or low BP? Unknown Did it involve sudden or severe rash/hives, skin peeling, or any reaction on the inside of your mouth or nose? Unknown Did you need to seek medical attention at a hospital or doctor's office? Unknown When did it last happen? teenager If all above answers are "NO", may proceed with cephalosporin use.   . Codeine Hives  . Erythromycin Rash  . Penicillins Rash    Did it involve swelling of the face/tongue/throat, SOB, or low BP?  Unknown Did it involve sudden or severe rash/hives, skin peeling, or any reaction on the inside of your mouth or nose? Unknown Did you need to seek medical attention at a hospital or doctor's office? Unknown When did it last happen? teenager If all above answers are "NO", may proceed with cephalosporin use.   Michelle Holt [Pseudoephedrine Hcl] Rash    PHYSICAL EXAM:  Performance status (ECOG): 1 - Symptomatic but completely ambulatory  Vitals:   10/18/19 1134  BP: 104/69  Pulse: 78  Resp: 17  Temp: (!) 97.1 F (36.2 C)  SpO2: 98%   Wt Readings from Last 3 Encounters:  10/18/19 173 lb 6.4 oz (78.7 kg)  10/07/19 171 lb 15.3 oz (78 kg)  07/27/19 164 lb (74.4 kg)   Physical Exam Vitals reviewed.  Constitutional:      Appearance: Normal appearance. She is obese.  Cardiovascular:     Rate and Rhythm: Normal rate and regular rhythm.     Pulses: Normal pulses.     Heart sounds: Normal heart sounds.  Pulmonary:     Effort: Pulmonary effort is normal.     Breath sounds: Normal breath sounds.  Abdominal:     Palpations: Abdomen is soft.     Tenderness: There is abdominal tenderness in the epigastric area.  Neurological:     General: No focal deficit present.     Mental Status: She is alert and oriented to person, place, and time.  Psychiatric:        Mood and Affect: Mood normal.        Behavior: Behavior normal.  LABORATORY DATA:  I have reviewed the labs as listed.  CBC Latest Ref Rng & Units 10/07/2019 06/16/2019 04/13/2019  WBC 4.0 - 10.5 K/uL 6.7 8.2 6.4  Hemoglobin 12.0 - 15.0 g/dL 12.9 12.9 10.6(L)  Hematocrit 36 - 46 % 39.2 38.7 32.6(L)  Platelets 150 - 400 K/uL 347 333 258   CMP Latest Ref Rng & Units 10/07/2019 06/16/2019 04/13/2019  Glucose 70 - 99 mg/dL 107(H) 101(H) 103(H)  BUN 6 - 20 mg/dL '16 13 11  ' Creatinine 0.44 - 1.00 mg/dL 0.56 0.56 0.63  Sodium 135 - 145 mmol/L 138 136 137  Potassium 3.5 - 5.1 mmol/L 3.9 3.8 4.0  Chloride 98 - 111 mmol/L 104 100  102  CO2 22 - 32 mmol/L '27 27 26  ' Calcium 8.9 - 10.3 mg/dL 9.4 9.7 9.5  Total Protein 6.5 - 8.1 g/dL 6.8 7.1 6.7  Total Bilirubin 0.3 - 1.2 mg/dL 0.6 0.6 0.6  Alkaline Phos 38 - 126 U/L 70 79 79  AST 15 - 41 U/L '17 22 24  ' ALT 0 - 44 U/L '18 25 25   ' CA-125 5.0 10/07/2019  CA-125 5.9 06/16/2019  CA-125 7.8 04/13/2019    DIAGNOSTIC IMAGING:  I have independently reviewed the scans and discussed with the patient. CT Abdomen Pelvis W Contrast  Result Date: 10/13/2019 CLINICAL DATA:  Stage IV ovarian cancer diagnosed 07/18/2018. Status post chemotherapy. Splenic laceration also in July of 2020. Hysterectomy and bilateral oophorectomy. Cholecystectomy. Irritable bowel syndrome. EXAM: CT ABDOMEN AND PELVIS WITH CONTRAST TECHNIQUE: Multidetector CT imaging of the abdomen and pelvis was performed using the standard protocol following bolus administration of intravenous contrast. CONTRAST:  173m OMNIPAQUE IOHEXOL 300 MG/ML  SOLN COMPARISON:  04/13/2019 FINDINGS: Lower chest: Clear lung bases. Normal heart size without pericardial or pleural effusion. Incompletely imaged. Moderate hiatal hernia central line in the right atrium. The herniated stomach is underdistended, but appears thick walled. Hepatobiliary: Normal liver. Cholecystectomy, without biliary ductal dilatation. Pancreas: Normal, without mass or ductal dilatation. Presumed splenic artery embolization coils in the region the pancreatic tail. Spleen: Fluid density along the superior aspect of the spleen measures 7.8 cm maximally on 19/2 today versus 8.4 cm at the same level on the prior. Slightly less well-defined today. Subtle foci of developing peripheral complexity/calcification. Adrenals/Urinary Tract: Normal adrenal glands. Too small to characterize interpolar left renal lesion is similar at 9 mm. Normal right kidney. No hydronephrosis. Normal urinary bladder. Stomach/Bowel: Normal distal stomach. Normal terminal ileum and appendix. Colonic stool  burden suggests constipation. Normal small bowel. Vascular/Lymphatic: Aortic atherosclerosis. No abdominopelvic adenopathy. Reproductive: Hysterectomy.  No adnexal mass. Other: No significant free fluid. Mild pelvic floor laxity. No evidence of omental or peritoneal disease. Tiny fat containing bilateral inguinal hernias. Musculoskeletal: Slowly enlarging left hip effusion, including on 86/2. Underlying moderate left hip joint space narrowing and osteophyte formation. IMPRESSION: 1. Status post hysterectomy and bilateral oophorectomy. No findings of recurrent or metastatic disease. 2. Evolution of slightly decreased perisplenic fluid collection, likely chronic hematoma. 3. Moderate hiatal hernia. Apparent wall thickening within the herniated stomach is similar and most likely due to underdistention. Gastritis could look similar. 4.  Possible constipation. 5. Enlarging left hip joint effusion, most likely secondary to osteoarthritis. Electronically Signed   By: KAbigail MiyamotoM.D.   On: 10/13/2019 14:38     ASSESSMENT:  1. 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 -Robotic assisted laparoscopic TAH  and BSO and omentectomy on 12/24/2018, pathology showing high-grade serous carcinoma, PT3P NX. -Germline mutation testing was negative. -3 more cycles of adjuvant chemotherapy completed on 03/15/2019. -CTAP on 04/13/2019 showed no findings of active malignancy.  28% reduction in the volume of presumed chronic hematoma/chronic fluid collection splaying the upper margin of the spleen.  Large type III hiatal hernia. -CTAP on 10/13/2019 shows no findings of recurrence or metastatic disease.   PLAN:  1. Clinical stage IVa high-grade serous ovarian cancer, positive cytology of left pleural effusion: -We reviewed CT scan findings. -We reviewed labs which showed normal CA-125. -Recommend  follow-up in 3 months with repeat tumor marker and labs.  2. Generalized pain/left upper quadrant pain: -Continue oxycodone as needed.  3. Peripheral neuropathy: -Continue 300 mg gabapentin at bedtime.  4. Hot flashes: -These are well controlled with venlafaxine 150 mg daily.   Orders placed this encounter:  No orders of the defined types were placed in this encounter.    Derek Jack, MD [place] [phone]   I, Milinda Antis, am acting as a scribe for Dr. Sanda Linger.  I, Derek Jack MD, have reviewed the above documentation for accuracy and completeness, and I agree with the above.

## 2019-10-20 ENCOUNTER — Ambulatory Visit (HOSPITAL_COMMUNITY): Payer: Medicaid Other

## 2019-10-21 ENCOUNTER — Other Ambulatory Visit (HOSPITAL_COMMUNITY): Payer: Self-pay

## 2019-10-21 DIAGNOSIS — C563 Malignant neoplasm of bilateral ovaries: Secondary | ICD-10-CM

## 2019-10-21 MED ORDER — OXYCODONE HCL 10 MG PO TABS: 10.0000 mg | ORAL_TABLET | ORAL | 0 refills | Status: DC | PRN

## 2019-11-01 ENCOUNTER — Other Ambulatory Visit (HOSPITAL_COMMUNITY): Payer: Self-pay | Admitting: Hematology

## 2019-11-18 ENCOUNTER — Other Ambulatory Visit (HOSPITAL_COMMUNITY): Payer: Self-pay

## 2019-11-18 DIAGNOSIS — C563 Malignant neoplasm of bilateral ovaries: Secondary | ICD-10-CM

## 2019-11-18 MED ORDER — OXYCODONE HCL 10 MG PO TABS: 10.0000 mg | ORAL_TABLET | ORAL | 0 refills | Status: DC | PRN

## 2019-11-18 MED ORDER — DIAZEPAM 5 MG PO TABS
5.0000 mg | ORAL_TABLET | Freq: Every day | ORAL | 5 refills | Status: DC
Start: 2019-11-18 — End: 2020-05-11

## 2019-12-13 ENCOUNTER — Other Ambulatory Visit (HOSPITAL_COMMUNITY): Payer: Self-pay | Admitting: Surgery

## 2019-12-13 DIAGNOSIS — R232 Flushing: Secondary | ICD-10-CM

## 2019-12-13 MED ORDER — VENLAFAXINE HCL ER 150 MG PO CP24: ORAL_CAPSULE | ORAL | 6 refills | Status: DC

## 2019-12-16 ENCOUNTER — Other Ambulatory Visit (HOSPITAL_COMMUNITY): Payer: Self-pay

## 2019-12-16 DIAGNOSIS — C563 Malignant neoplasm of bilateral ovaries: Secondary | ICD-10-CM

## 2019-12-16 MED ORDER — OXYCODONE HCL 10 MG PO TABS: 10.0000 mg | ORAL_TABLET | ORAL | 0 refills | Status: DC | PRN

## 2020-01-03 ENCOUNTER — Other Ambulatory Visit (HOSPITAL_COMMUNITY): Payer: Self-pay

## 2020-01-03 MED ORDER — PANTOPRAZOLE SODIUM 40 MG PO TBEC: DELAYED_RELEASE_TABLET | ORAL | 2 refills | Status: DC

## 2020-01-12 ENCOUNTER — Other Ambulatory Visit (HOSPITAL_COMMUNITY): Payer: Self-pay

## 2020-01-12 DIAGNOSIS — C563 Malignant neoplasm of bilateral ovaries: Secondary | ICD-10-CM

## 2020-01-12 MED ORDER — OXYCODONE HCL 10 MG PO TABS: 10.0000 mg | ORAL_TABLET | ORAL | 0 refills | Status: DC | PRN

## 2020-01-26 ENCOUNTER — Other Ambulatory Visit: Payer: Self-pay

## 2020-01-26 ENCOUNTER — Inpatient Hospital Stay (HOSPITAL_COMMUNITY): Payer: Medicaid Other | Attending: Hematology | Admitting: Hematology

## 2020-01-26 ENCOUNTER — Inpatient Hospital Stay (HOSPITAL_COMMUNITY): Payer: Medicaid Other

## 2020-01-26 VITALS — BP 130/80 | HR 85 | Temp 97.2°F | Resp 18 | Wt 174.8 lb

## 2020-01-26 DIAGNOSIS — C563 Malignant neoplasm of bilateral ovaries: Secondary | ICD-10-CM | POA: Diagnosis not present

## 2020-01-26 DIAGNOSIS — F418 Other specified anxiety disorders: Secondary | ICD-10-CM | POA: Diagnosis not present

## 2020-01-26 DIAGNOSIS — Z8249 Family history of ischemic heart disease and other diseases of the circulatory system: Secondary | ICD-10-CM | POA: Diagnosis not present

## 2020-01-26 DIAGNOSIS — Z90722 Acquired absence of ovaries, bilateral: Secondary | ICD-10-CM | POA: Insufficient documentation

## 2020-01-26 DIAGNOSIS — Z8543 Personal history of malignant neoplasm of ovary: Secondary | ICD-10-CM | POA: Diagnosis not present

## 2020-01-26 DIAGNOSIS — G629 Polyneuropathy, unspecified: Secondary | ICD-10-CM | POA: Diagnosis not present

## 2020-01-26 DIAGNOSIS — K589 Irritable bowel syndrome without diarrhea: Secondary | ICD-10-CM | POA: Insufficient documentation

## 2020-01-26 DIAGNOSIS — Z9221 Personal history of antineoplastic chemotherapy: Secondary | ICD-10-CM | POA: Diagnosis not present

## 2020-01-26 DIAGNOSIS — Z803 Family history of malignant neoplasm of breast: Secondary | ICD-10-CM | POA: Insufficient documentation

## 2020-01-26 DIAGNOSIS — Z79899 Other long term (current) drug therapy: Secondary | ICD-10-CM | POA: Insufficient documentation

## 2020-01-26 DIAGNOSIS — R1012 Left upper quadrant pain: Secondary | ICD-10-CM | POA: Insufficient documentation

## 2020-01-26 DIAGNOSIS — Z87891 Personal history of nicotine dependence: Secondary | ICD-10-CM | POA: Insufficient documentation

## 2020-01-26 DIAGNOSIS — N951 Menopausal and female climacteric states: Secondary | ICD-10-CM | POA: Diagnosis not present

## 2020-01-26 DIAGNOSIS — Z8049 Family history of malignant neoplasm of other genital organs: Secondary | ICD-10-CM | POA: Diagnosis not present

## 2020-01-26 DIAGNOSIS — Z9079 Acquired absence of other genital organ(s): Secondary | ICD-10-CM | POA: Diagnosis not present

## 2020-01-26 DIAGNOSIS — Z90710 Acquired absence of both cervix and uterus: Secondary | ICD-10-CM | POA: Diagnosis not present

## 2020-01-26 DIAGNOSIS — M25461 Effusion, right knee: Secondary | ICD-10-CM | POA: Diagnosis not present

## 2020-01-26 DIAGNOSIS — Z833 Family history of diabetes mellitus: Secondary | ICD-10-CM | POA: Insufficient documentation

## 2020-01-26 LAB — COMPREHENSIVE METABOLIC PANEL
ALT: 16 U/L (ref 0–44)
AST: 19 U/L (ref 15–41)
Albumin: 3.9 g/dL (ref 3.5–5.0)
Alkaline Phosphatase: 59 U/L (ref 38–126)
Anion gap: 11 (ref 5–15)
BUN: 19 mg/dL (ref 6–20)
CO2: 26 mmol/L (ref 22–32)
Calcium: 9.1 mg/dL (ref 8.9–10.3)
Chloride: 100 mmol/L (ref 98–111)
Creatinine, Ser: 0.71 mg/dL (ref 0.44–1.00)
GFR, Estimated: >60 mL/min (ref >60)
Glucose, Bld: 127 mg/dL — ABNORMAL HIGH (ref 70–99)
Potassium: 3.8 mmol/L (ref 3.5–5.1)
Sodium: 137 mmol/L (ref 135–145)
Total Bilirubin: 0.6 mg/dL (ref 0.3–1.2)
Total Protein: 6.9 g/dL (ref 6.5–8.1)

## 2020-01-26 LAB — CBC WITH DIFFERENTIAL/PLATELET
Abs Immature Granulocytes: 0.05 10*3/uL (ref 0.00–0.07)
Basophils Absolute: 0 10*3/uL (ref 0.0–0.1)
Basophils Relative: 0 %
Eosinophils Absolute: 0 10*3/uL (ref 0.0–0.5)
Eosinophils Relative: 0 %
HCT: 35 % — ABNORMAL LOW (ref 36.0–46.0)
Hemoglobin: 11.2 g/dL — ABNORMAL LOW (ref 12.0–15.0)
Immature Granulocytes: 0 %
Lymphocytes Relative: 8 %
Lymphs Abs: 1 10*3/uL (ref 0.7–4.0)
MCH: 34.8 pg — ABNORMAL HIGH (ref 26.0–34.0)
MCHC: 32 g/dL (ref 30.0–36.0)
MCV: 108.7 fL — ABNORMAL HIGH (ref 80.0–100.0)
Monocytes Absolute: 0.2 10*3/uL (ref 0.1–1.0)
Monocytes Relative: 1 %
Neutro Abs: 10.9 10*3/uL — ABNORMAL HIGH (ref 1.7–7.7)
Neutrophils Relative %: 91 %
Platelets: 455 10*3/uL — ABNORMAL HIGH (ref 150–400)
RBC: 3.22 MIL/uL — ABNORMAL LOW (ref 3.87–5.11)
RDW: 13.2 % (ref 11.5–15.5)
WBC: 12.2 10*3/uL — ABNORMAL HIGH (ref 4.0–10.5)
nRBC: 0 % (ref 0.0–0.2)

## 2020-01-26 LAB — MAGNESIUM: Magnesium: 1.9 mg/dL (ref 1.7–2.4)

## 2020-01-26 NOTE — Patient Instructions (Signed)
[facility] at [facility] °Discharge Instructions ° °You were seen today by Dr. Katragadda. He went over your recent results. You will be scheduled for a CT scan of your abdomen before your next visit. Dr. Katragadda will see you back in 3 months for labs and follow up. ° ° °Thank you for choosing [facility] at [facility] to provide your oncology and hematology care.  To afford each patient quality time with our provider, please arrive at least 15 minutes before your scheduled appointment time.  ° °If you have a lab appointment with the Cancer Center please come in thru the Main Entrance and check in at the main information desk ° °You need to re-schedule your appointment should you arrive 10 or more minutes late.  We strive to give you quality time with our providers, and arriving late affects you and other patients whose appointments are after yours.  Also, if you no show three or more times for appointments you may be dismissed from the clinic at the providers discretion.     °Again, thank you for choosing [facility].  Our hope is that these requests will decrease the amount of time that you wait before being seen by our physicians.       °_____________________________________________________________ ° °Should you have questions after your visit to [facility], please contact our office at [phone] between the hours of 8:00 a.m. and 4:30 p.m.  Voicemails left after 4:00 p.m. will not be returned until the following business day.  For prescription refill requests, have your pharmacy contact our office and allow 72 hours.   ° °Cancer Center Support Programs:  ° °> Cancer Support Group  °2nd Tuesday of the month 1pm-2pm, Journey Room  ° ° °

## 2020-01-26 NOTE — Progress Notes (Signed)
Patients port flushed without difficulty.  Good blood return noted with no bruising or swelling noted at site.  Band aid applied.  VSS with discharge and left in satisfactory condition with no s/s of distress noted.   

## 2020-01-26 NOTE — Progress Notes (Signed)
Michelle Holt, [place] [address]   CLINIC:  Medical Oncology/Hematology  PCP:  [name], MD [address] / [place] [address] [phone]   REASON FOR VISIT:  Follow-up for high-grade serous ovarian cancer  PRIOR THERAPY:  1. Carboplatin and paclitaxel x 7 cycles from 08/24/2018 to 03/15/2019. 2. Laparoscopic TAH & BSO & omenectomy on 12/24/2018.  NGS Results: Not done  CURRENT THERAPY: Observation  BRIEF ONCOLOGIC HISTORY:  Oncology History  Ovarian cancer, bilateral  07/07/2018 Pathology Results   PLEURAL FLUID, LEFT (SPECIMEN 1 OF 1, COLLECTED 07/07/18): - MALIGNANT CELLS CONSISTENT WITH ADENOCARCINOMA - SEE COMMENT  Source Pleural Fluid, (Specimen 1 of 1, collected on 07/07/2018) Gross Specimen: Received is/are 1000cc of bloody red fluid with tissue. (TC:tc) Prepared: # Smears: 0 # Concentration Technique Slides (i.e. ThinPrep): 1 # Cell Block: 1 Conventional Additional Studies: Two Hematology slides labeled [id number] Comment The malignant cells are positive for cytokeratin 7, p53, WT-1, Pax-8, Moc31, ER (weak) and EMA but negative for cytokeratin 20, TTF-1, GATA-3, CDX-2 and D2-40. Overall, the phenotype is consistent with a gynecologic primary; clinical correlation recommended.   07/07/2018 Procedure   Successful ultrasound guided left thoracentesis yielding 2.0 L of pleural fluid   07/08/2018 Procedure   1. Technically successful placement of left 14 French pigtail chest drain, placed to Pleur-evac water-seal.   07/08/2018 Procedure   1. Technically successful five [place] double lumen power injectable PICC placement   07/09/2018 Imaging   Ct chest 1. There is a moderate, loculated left hydropneumothorax with a small air component and moderate fluid component. The largest loculated component is located posteriorly. There is a pigtail drainage catheter about the lateral pleural space. There is no obvious etiology, such as obvious  mass or pleural disease.   2. There is a small right pleural effusion with associated atelectasis or consolidation and a subpleural consolidation of the superior segment right lower lobe (series 4, image 56), of uncertain significance, possibly infectious or inflammatory   07/10/2018 Imaging   Ct abdomen and pelvis: 1. The bilateral ovaries are enlarged by heterogeneous appearing cystic lesions, measuring 5.3 x 4.2 cm on the right (series 4, image 72) and 4.5 x 3.2 cm on the left (series 4, image 75). Consider dedicated pelvic ultrasound and/or pelvic MRI to further evaluate for solid components given high suspicion for GYN primary malignancy.   2. No other evidence of mass and no lymphadenopathy in the abdomen or pelvis.   3. Trace ascites. There is some suggestion of omental and peritoneal nodularity (e.g. Series 4, image 82), concerning for peritoneal metastatic disease.    4. Loculated left-sided pleural effusion with left-sided pleural drainage catheter in position. Small right pleural effusion   07/13/2018 Surgery   OPERATION: 1.  Left VATS (video-assisted thoracoscopic surgery) for drainage of loculated pleural effusion. 2.  Talc pleurodesis for malignant pleural effusion. 3.  Placement of PleurX catheter for management of malignant pleural effusion. 4.  Placement of On-Q analgesia catheter system.    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   07/13/2018 Pathology Results   Pleura, peel, Left Pleural - FIBRO-FIBRINOUS PLEURITIS - NEGATIVE FOR MALIGNANCY   07/18/2018 Initial Diagnosis   Ovarian cancer, bilateral ([place])   07/20/2018 Procedure   EGD impression: Normal proximal esophagus and mid esophagus. Mild distal esophageal  rings; dilation not performed because of esophagitis. LA Grade C  reflux esophagitis. Z-line regular, 30 cm from the incisors. 5 cm hiatal hernia. Non-bleeding gastric ulcer with no stigmata of bleeding. Gastritis. Duodenal erosions without bleeding. Normal second portion of the duodenum. No specimens collected.   08/19/2018 Genetic Testing   Negative genetic testing on the common hereditary cancer panel.  The Common Hereditary Gene Panel offered by [facility] includes sequencing and/or deletion duplication testing of the following 48 genes: APC, ATM, AXIN2, BARD1, BMPR1A, BRCA1, BRCA2, BRIP1, CDH1, CDK4, CDKN2A (p14ARF), CDKN2A (p16INK4a), CHEK2, CTNNA1, DICER1, EPCAM (Deletion/duplication testing only), GREM1 (promoter region deletion/duplication testing only), KIT, MEN1, MLH1, MSH2, MSH3, MSH6, MUTYH, NBN, NF1, NHTL1, PALB2, PDGFRA, PMS2, POLD1, POLE, PTEN, RAD50, RAD51C, RAD51D, RNF43, SDHB, SDHC, SDHD, SMAD4, SMARCA4. STK11, TP53, TSC1, TSC2, and VHL.  The following genes were evaluated for sequence changes only: SDHA and HOXB13 c.251G>A variant only. The report date is August 19, 2018.   08/24/2018 -  Chemotherapy   The patient had palonosetron (ALOXI) injection 0.25 mg, 0.25 mg, Intravenous,  Once, 7 of 7 cycles Administration: 0.25 mg (08/24/2018), 0.25 mg (09/14/2018), 0.25 mg (10/05/2018), 0.25 mg (12/01/2018), 0.25 mg (01/27/2019), 0.25 mg (02/22/2019), 0.25 mg (03/15/2019) CARBOplatin (PARAPLATIN) 740 mg in sodium chloride 0.9 % 250 mL chemo infusion, 740 mg (100 % of original dose 742.8 mg), Intravenous,  Once, 7 of 7 cycles Dose modification:   (original dose 742.8 mg, Cycle 1),   (original dose 736.8 mg, Cycle 2),   (original dose 736.8 mg, Cycle 3),   (original dose 736.8 mg, Cycle 4),   (original dose 736.8 mg, Cycle 5),   (original dose 736.8 mg, Cycle 6),   (original dose 736.8 mg, Cycle 7) Administration: 740 mg (08/24/2018), 740 mg (09/14/2018), 740 mg (10/05/2018), 740 mg (12/01/2018), 740 mg (01/27/2019), 740 mg (02/22/2019), 740 mg (03/15/2019) PACLitaxel  (TAXOL) 324 mg in sodium chloride 0.9 % 500 mL chemo infusion (> $RemoveBef'80mg'tvCFYVglea$ /m2), 175 mg/m2 = 324 mg, Intravenous,  Once, 7 of 7 cycles Administration: 324 mg (08/24/2018), 324 mg (09/14/2018), 324 mg (10/05/2018), 324 mg (12/01/2018), 324 mg (01/27/2019), 324 mg (02/22/2019), 324 mg (03/15/2019) fosaprepitant (EMEND) 150 mg, dexamethasone (DECADRON) 12 mg in sodium chloride 0.9 % 145 mL IVPB, , Intravenous,  Once, 7 of 7 cycles Administration:  (08/24/2018),  (09/14/2018),  (10/05/2018),  (12/01/2018),  (01/27/2019),  (02/22/2019),  (03/15/2019)  for chemotherapy treatment.      CANCER STAGING: Cancer Staging No matching staging information was found for the patient.  INTERVAL HISTORY:  [name], a 50 y.o. female, returns for routine follow-up of her high-grade serous ovarian cancer. [place] was last seen on 10/18/2019.   Today she reports feeling okay. She complains of having right knee swelling which hurts and interferes with her mobility to the point that she is not able to get up in the mornings; she also has left hip pain and lower back pain. She reports having 1 major fall and buckles often when she walks. She tried to see her ortho surgeon (Dr. Erlinda Hong) to have the swelling and pain addressed, but is only able to see them in 3 weeks; her last Medrol injection into her left hip was in 07/27/2019. She is taking prednisone 10 mg, Advil Arthritis 400 mg BID, and 2 tablets of oxycodone PRN. She continues taking Effexor for her hot flashes and 100 mg gabapentin at bedtime due to drowsiness; she continues having numbness in her hands at night. She also gets abdominal pain  when she turns her torso in an abnormal position or arches back. She reports that she started experiencing what feel like menstrual cramps for the past 2 months, but denies vaginal bleeding.  She is taking care of her son who had a TBI after an ATV accident.   REVIEW OF SYSTEMS:  Review of Systems  Constitutional: Positive for fatigue (depleted).  Negative for appetite change.  Cardiovascular: Positive for leg swelling (knee swelling).  Gastrointestinal: Positive for abdominal pain (in abdominal position; menstrual-like cramping) and nausea.  Endocrine: Positive for hot flashes.  Genitourinary: Negative for vaginal bleeding.   Musculoskeletal: Positive for arthralgias (10/10 L knee, R hip pain) and back pain.  Neurological: Positive for numbness (in hands at bedtime).  All other systems reviewed and are negative.   PAST MEDICAL/SURGICAL HISTORY:  Past Medical History:  Diagnosis Date  . Anxiety and depression   . Arthritis of facet joints at multiple vertebral levels    L5-S1  . Constipation   . Dyslipidemia   . Family history of breast cancer   . Family history of uterine cancer   . History of kidney stones   . Insomnia   . Irritable bowel syndrome   . Migraine   . Muscle tension headache   . Neuropathy of finger   . Ovarian carcinoma ([place])   . Plantar fasciitis of right foot   . Port-A-Cath in place 08/20/2018   Past Surgical History:  Procedure Laterality Date  . CHOLECYSTECTOMY  2008  . COLONOSCOPY N/A 08/13/2013   Procedure: COLONOSCOPY;  Surgeon: Rogene Houston, MD;  [facility];  Service: Endoscopy;  Laterality: N/A;  230-moved to 145 Ann to notify pt  . ESOPHAGOGASTRODUODENOSCOPY    . ESOPHAGOGASTRODUODENOSCOPY (EGD) WITH PROPOFOL N/A 07/20/2018   Procedure: ESOPHAGOGASTRODUODENOSCOPY (EGD) WITH PROPOFOL;  Surgeon: Rogene Houston, MD;  [facility];  Service: Endoscopy;  Laterality: N/A;  Possible esophageal dilation.  . IR ANGIOGRAM SELECTIVE EACH ADDITIONAL VESSEL  08/01/2018  . IR ANGIOGRAM VISCERAL SELECTIVE  08/01/2018  . IR EMBO ART  VEN HEMORR LYMPH EXTRAV  INC GUIDE ROADMAPPING  08/01/2018  . IR IMAGING GUIDED PORT INSERTION  08/20/2018  . IR PERC PLEURAL DRAIN W/INDWELL CATH W/IMG GUIDE  07/08/2018  . IR THORACENTESIS ASP PLEURAL SPACE W/IMG GUIDE  07/07/2018  . IR US GUIDE VASC ACCESS  RIGHT  08/01/2018  . PLEURAL EFFUSION DRAINAGE Left 07/13/2018   Procedure: DRAINAGE OF LOCULATED PLEURAL EFFUSION;  Surgeon: Ivin Poot, MD;  Location: [facility];  Service: Thoracic;  Laterality: Left;  . REMOVAL OF PLEURAL DRAINAGE CATHETER Left 08/20/2018   Procedure: REMOVAL OF PLEURAL DRAINAGE CATHETER;  Surgeon: Ivin Poot, MD;  Location: [facility];  Service: Thoracic;  Laterality: Left;  . REMOVAL OF PLEURAL DRAINAGE CATHETER Left 08/20/2018   Procedure: REMOVAL OF PLEURAL DRAINAGE CATHETER;  Surgeon: Ivin Poot, MD;  Location: [facility];  Service: Thoracic;  Laterality: Left;  . TALC PLEURODESIS Left 07/13/2018   Procedure: Talc Pleuradesis;  Surgeon: Prescott Gum, Collier Salina, MD;  Location: [facility];  Service: Thoracic;  Laterality: Left;  . TUBAL LIGATION Bilateral   . UTERINE ABLATION    . VIDEO ASSISTED THORACOSCOPY Left 07/13/2018   Procedure: VIDEO ASSISTED THORACOSCOPY;  Surgeon: Ivin Poot, MD;  Location[facility];  Service: Thoracic;  Laterality: Left;    SOCIAL HISTORY:  Social History   Socioeconomic History  . Marital status: Widowed    Spouse name: Not on file  .  Number of children: 2  . Years of education: 2-College  . Highest education level: Not on file  Occupational History    Employer: [employer]  Tobacco Use  . Smoking status: Former Smoker    Packs/day: 0.50    Years: 17.00    Pack years: 8.50    Types: Cigarettes    Quit date: 06/22/2018    Years since quitting: 1.5  . Smokeless tobacco: Never Used  Vaping Use  . Vaping Use: Never used  Substance and Sexual Activity  . Alcohol use: Yes    Alcohol/week: 1.0 standard drink    Types: 1 Glasses of wine per week    Comment: Drinks alcohol on occasion  . Drug use: No  . Sexual activity: Not on file  Other Topics Concern  . Not on file  Social History Narrative   Patient lives at home with her daughter.    Patient has 2 children.    Patient is widowed.    Patient is right handed.    Patient has her Associates  degree.      Social Determinants of Health   Financial Resource Strain: Not on file  Food Insecurity: Not on file  Transportation Needs: Not on file  Physical Activity: Not on file  Stress: Not on file  Social Connections: Not on file  Intimate Partner Violence: Not on file    FAMILY HISTORY:  Family History  Problem Relation Age of Onset  . Hypertension Mother   . Obesity Mother   . Diabetes Mother   . Kidney disease Mother   . Peripheral vascular disease Father   . Atrial fibrillation Father   . COPD Brother   . Osteoporosis Brother   . Crohn's disease Sister   . Uterine cancer Sister 59       maternal half sister  . Breast cancer Maternal Aunt 63  . Colon cancer Neg Hx     CURRENT MEDICATIONS:  Current Outpatient Medications  Medication Sig Dispense Refill  . Ascorbic Acid (VITAMIN C) 1000 MG tablet Take 1,000 mg by mouth every other day.    . bisacodyl (DULCOLAX) 5 MG EC tablet Take 5 mg by mouth 2 (two) times daily.    . Cholecalciferol (VITAMIN D) 50 MCG (2000 UT) CAPS Take 2,000 Units by mouth daily.    Marland Kitchen dexamethasone (DECADRON) 4 MG tablet TAKE ONE TABLET BY MOUTH DAILY. (Patient taking differently: as needed.) 30 tablet 1  . diazepam (VALIUM) 5 MG tablet Take 1 tablet (5 mg total) by mouth at bedtime. 30 tablet 5  . dicyclomine (BENTYL) 10 MG capsule Take 1 capsule (10 mg total) by mouth 4 (four) times daily -  before meals and at bedtime. 60 capsule 2  . lidocaine-prilocaine (EMLA) cream Apply small amount to port a cath site and cover with plastic wrap one hour prior to chemotherapy appointments 30 g 3  . magnesium oxide (MAG-OX) 400 MG tablet Take 400 mg by mouth every other day.    . naproxen sodium (ALEVE) 220 MG tablet Take 400 mg by mouth daily as needed.    . ondansetron (ZOFRAN-ODT) 8 MG disintegrating tablet TAKE ONE TABLET BY MOUTH EVERY 8 HOURS AS NEEDED FOR NAUSEA AND VOMITING. 30 tablet 2  . Oxycodone HCl 10 MG TABS Take 1 tablet (10 mg total) by  mouth every 4 (four) hours as needed. 168 tablet 0  . pantoprazole (PROTONIX) 40 MG tablet TAKE ONE TABLET BY MOUTH TWICE DAILY BEFORE A MEAL 60 tablet  2  . predniSONE (DELTASONE) 10 MG tablet Take 10 mg by mouth 2 (two) times daily.    Marland Kitchen senna (SENOKOT) 8.6 MG TABS tablet Take 1 tablet (8.6 mg total) by mouth daily as needed for mild constipation or moderate constipation. 120 tablet 0  . SUMAtriptan (IMITREX) 100 MG tablet TAKE ONE TABLET BY MOUTH PRN UP to TWICE DAILY AS NEEDED. 9 tablet 5  . triamcinolone cream (KENALOG) 0.1 % Apply 1 application topically 2 (two) times daily. 15 g 2  . valACYclovir (VALTREX) 1000 MG tablet Take 1 tablet (1,000 mg total) by mouth 3 (three) times daily. 21 tablet 0  . venlafaxine XR (EFFEXOR-XR) 150 MG 24 hr capsule TAKE ONE CAPSULE BY MOUTH DAILY WITH BREAKFAST 30 capsule 6  . acetaminophen (TYLENOL) 500 MG tablet Take 1,000 mg by mouth every 6 (six) hours as needed for moderate pain. (Patient not taking: Reported on 01/26/2020)    . polyethylene glycol (MIRALAX / GLYCOLAX) 17 g packet Take 17 g by mouth daily as needed for moderate constipation.  (Patient not taking: Reported on 01/26/2020)    . PROAIR HFA 108 (90 Base) MCG/ACT inhaler SMARTSIG:2 Puff(s) Via Inhaler 4 Times Daily PRN (Patient not taking: Reported on 01/26/2020)     No current facility-administered medications for this visit.    ALLERGIES:  Allergies  Allergen Reactions  . Morphine And Related Itching  . Nortriptyline Other (See Comments)    Significant weight gain  . Topamax [Topiramate] Diarrhea and Nausea Only  . Xanax [Alprazolam]   . Actifed Cold-Allergy [Chlorpheniramine-Phenyleph Er] Rash  . Amoxicillin Rash    Did it involve swelling of the face/tongue/throat, SOB, or low BP? Unknown Did it involve sudden or severe rash/hives, skin peeling, or any reaction on the inside of your mouth or nose? Unknown Did you need to seek medical attention at a hospital or doctor's office?  Unknown When did it last happen? teenager If all above answers are "NO", may proceed with cephalosporin use.   . Codeine Hives  . Erythromycin Rash  . Penicillins Rash    Did it involve swelling of the face/tongue/throat, SOB, or low BP? Unknown Did it involve sudden or severe rash/hives, skin peeling, or any reaction on the inside of your mouth or nose? Unknown Did you need to seek medical attention at a hospital or doctor's office? Unknown When did it last happen? teenager If all above answers are "NO", may proceed with cephalosporin use.   [name] [Pseudoephedrine Hcl] Rash    PHYSICAL EXAM:  Performance status (ECOG): 1 - Symptomatic but completely ambulatory  Vitals:   01/26/20 1048  BP: 130/80  Pulse: 85  Resp: 18  Temp: (!) 97.2 F (36.2 C)  SpO2: 97%   Wt Readings from Last 3 Encounters:  01/26/20 174 lb 12.8 oz (79.3 kg)  10/18/19 173 lb 6.4 oz (78.7 kg)  10/07/19 171 lb 15.3 oz (78 kg)   Physical Exam Vitals reviewed.  Constitutional:      Appearance: Normal appearance. She is obese.  Cardiovascular:     Rate and Rhythm: Normal rate and regular rhythm.     Pulses: Normal pulses.     Heart sounds: Normal heart sounds.  Pulmonary:     Effort: Pulmonary effort is normal.     Breath sounds: Normal breath sounds.  Abdominal:     Palpations: Abdomen is soft. There is no hepatomegaly, splenomegaly or mass.     Tenderness: There is no abdominal tenderness.  Hernia: No hernia is present.  Musculoskeletal:     Right knee: Effusion present.     Left knee: No effusion.     Right lower leg: No edema.     Left lower leg: No edema.  Neurological:     General: No focal deficit present.     Mental Status: She is alert and oriented to person, place, and time.  Psychiatric:        Mood and Affect: Mood normal.        Behavior: Behavior normal.      LABORATORY DATA:  I have reviewed the labs as listed.  CBC Latest Ref Rng & Units 01/26/2020  10/07/2019 06/16/2019  WBC 4.0 - 10.5 K/uL 12.2(H) 6.7 8.2  Hemoglobin 12.0 - 15.0 g/dL 11.2(L) 12.9 12.9  Hematocrit 36.0 - 46.0 % 35.0(L) 39.2 38.7  Platelets 150 - 400 K/uL 455(H) 347 333   CMP Latest Ref Rng & Units 10/07/2019 06/16/2019 04/13/2019  Glucose 70 - 99 mg/dL 107(H) 101(H) 103(H)  BUN 6 - 20 mg/dL $Remove'16 13 11  'SqYGdab$ Creatinine 0.44 - 1.00 mg/dL 0.56 0.56 0.63  Sodium 135 - 145 mmol/L 138 136 137  Potassium 3.5 - 5.1 mmol/L 3.9 3.8 4.0  Chloride 98 - 111 mmol/L 104 100 102  CO2 22 - 32 mmol/L $RemoveB'27 27 26  'faHTROeE$ Calcium 8.9 - 10.3 mg/dL 9.4 9.7 9.5  Total Protein 6.5 - 8.1 g/dL 6.8 7.1 6.7  Total Bilirubin 0.3 - 1.2 mg/dL 0.6 0.6 0.6  Alkaline Phos 38 - 126 U/L 70 79 79  AST 15 - 41 U/L $Remo'17 22 24  'xgSRE$ ALT 0 - 44 U/L $Remo'18 25 25    'OOhAM$ DIAGNOSTIC IMAGING:  I have independently reviewed the scans and discussed with the patient. No results found.   ASSESSMENT:  1. 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 -Robotic assisted laparoscopic TAH and BSO and omentectomy on 12/24/2018, pathology showing high-grade serous carcinoma, PT3P NX. -Germline mutation testing was negative. -3 more cycles of adjuvant chemotherapy completed on 03/15/2019. -CTAP on 04/13/2019 showed no findings of active malignancy. 28% reduction in the volume of presumed chronic hematoma/chronic fluid collection splaying the upper margin of the spleen. Large type III hiatal hernia. -CTAP on 10/13/2019 shows no findings of recurrence or metastatic disease.   PLAN:  1. Clinical stage IVa high-grade serous ovarian cancer, positive cytology of left pleural effusion: -She does not report any major abdominal pains.  She did report some cramping occasionally which started in the last few days. - Reviewed labs from 01/26/2020.  LFTs are normal.  Electrolytes are normal. - White count is elevated at 12.2, likely from  prednisone.  CA125 level is pending. - RTC 3 months with labs and CT scan of the abdomen and pelvis.  2. Generalized pain/left upper quadrant pain: -Continue oxycodone as needed.  3. Peripheral neuropathy: As she has tingling and numbness in the hands at nighttime. -She is only able to tolerate 100 mg of gabapentin at bedtime as it causes drowsiness.  4. Hot flashes: -Continue Effexor 150 mg daily.  5.  Left hip and right knee arthritic pains: - She reports worsening pains in her right knee and left hip.  There is slight effusion in the right knee. - She will follow-up with Dr. Erlinda Hong. - She is currently taking oxycodone, ibuprofen and prednisone.   Orders placed this encounter:  No orders of the defined  types were placed in this encounter.    Derek Jack, MD [place] [phone]   I, Milinda Antis, am acting as a scribe for Dr. Sanda Linger.  I, Derek Jack MD, have reviewed the above documentation for accuracy and completeness, and I agree with the above.

## 2020-01-27 LAB — CA 125: Cancer Antigen (CA) 125: 6 U/mL (ref 0.0–38.1)

## 2020-02-04 ENCOUNTER — Encounter (HOSPITAL_COMMUNITY): Payer: Self-pay | Admitting: *Deleted

## 2020-02-04 NOTE — Progress Notes (Signed)
Patient notified that her tumor marker labs are normal.  She was told that her WBC is slightly elevated but more than likely due to her prednisone use.    She verbalizes understanding.

## 2020-02-09 ENCOUNTER — Encounter: Payer: Self-pay | Admitting: Orthopaedic Surgery

## 2020-02-09 ENCOUNTER — Other Ambulatory Visit (HOSPITAL_COMMUNITY): Payer: Self-pay

## 2020-02-09 ENCOUNTER — Ambulatory Visit (INDEPENDENT_AMBULATORY_CARE_PROVIDER_SITE_OTHER): Payer: Medicaid Other

## 2020-02-09 ENCOUNTER — Other Ambulatory Visit: Payer: Self-pay

## 2020-02-09 ENCOUNTER — Ambulatory Visit (INDEPENDENT_AMBULATORY_CARE_PROVIDER_SITE_OTHER): Payer: Medicaid Other | Admitting: Orthopaedic Surgery

## 2020-02-09 VITALS — BP 125/86 | HR 96 | Ht 66.0 in | Wt 174.0 lb

## 2020-02-09 DIAGNOSIS — G8929 Other chronic pain: Secondary | ICD-10-CM | POA: Diagnosis not present

## 2020-02-09 DIAGNOSIS — M25561 Pain in right knee: Secondary | ICD-10-CM | POA: Insufficient documentation

## 2020-02-09 DIAGNOSIS — M25552 Pain in left hip: Secondary | ICD-10-CM | POA: Diagnosis not present

## 2020-02-09 DIAGNOSIS — M1612 Unilateral primary osteoarthritis, left hip: Secondary | ICD-10-CM | POA: Insufficient documentation

## 2020-02-09 DIAGNOSIS — C563 Malignant neoplasm of bilateral ovaries: Secondary | ICD-10-CM

## 2020-02-09 MED ORDER — OXYCODONE HCL 10 MG PO TABS: 10.0000 mg | ORAL_TABLET | ORAL | 0 refills | Status: DC | PRN

## 2020-02-09 MED ORDER — BUPIVACAINE HCL 0.5 % IJ SOLN
2.0000 mL | INTRAMUSCULAR | Status: AC | PRN
  Administered 2020-02-09: 2 mL via INTRA_ARTICULAR

## 2020-02-09 MED ORDER — LIDOCAINE HCL 1 % IJ SOLN
2.0000 mL | INTRAMUSCULAR | Status: AC | PRN
  Administered 2020-02-09: 2 mL

## 2020-02-09 NOTE — Progress Notes (Signed)
Office Visit Note   Patient: Michelle Holt           Date of Birth: [birth year]           MRN: [id number] Visit Date: 02/09/2020              Requested by: Ignatius Specking, MD [address] [place],  [place] [address] PCP: Ignatius Specking, MD   Assessment & Plan: Visit Diagnoses:  1. Acute pain of right knee   2. Pain in left hip   3. Unilateral primary osteoarthritis, left hip   4. Chronic pain of right knee     Plan: osteoarthritis left hip with poor response to prior injection. Patient wishes to try another as she is not ready for THR. Will schedule at [facility]. Also experiencing right knee pain with films noting OA-will inject with betamethasone and monitor response.  Follow-Up Instructions: Return if symptoms worsen or fail to improve.   Orders:  Orders Placed This Encounter  Procedures  . Large Joint Inj: R knee  . XR KNEE 3 VIEW RIGHT  . DL FLUORO GUIDED NEEDLE PLC ASPIRATION / INJECTTION/LOC   No orders of the defined types were placed in this encounter.     Procedures: Large Joint Inj: R knee on 02/09/2020 9:59 AM Indications: pain and diagnostic evaluation Details: 25 G 1.5 in needle, anteromedial approach  Arthrogram: No  Medications: 2 mL lidocaine 1 %; 2 mL bupivacaine 0.5 %  2 mL betamethasone (12 mg) ejected into right knee Procedure, treatment alternatives, risks and benefits explained, specific risks discussed. Consent was given by the patient. Immediately prior to procedure a time out was called to verify the correct patient, procedure, equipment, support staff and site/side marked as required. Patient was prepped and draped in the usual sterile fashion.       Clinical Data: No additional findings.   Subjective: Chief Complaint  Patient presents with  . Right Knee - Pain  . Left Hip - Pain  Patient presents with ongoing chronic left hip pain and acute pain of her right knee. She states that her left hip pain continues to get worse. She had an  ultrasound guided left hip injection on 07/27/2019 by Dr. Prince Rome that provided no relief.  She is also now having acute right knee pain x one month. She denies any injury. She is taking Prednisone, Oxycodone and Aleve which have helped with swelling, but not with pain.    HPI  Review of Systems   Objective: Vital Signs: BP 125/86   Pulse 96   Ht 5\' 6"  (1.676 m)   Wt 174 lb (78.9 kg)   LMP  (LMP Unknown)   BMI 28.08 kg/m   Physical Exam Constitutional:      Appearance: She is well-developed and well-nourished.  HENT:     Mouth/Throat:     Mouth: Oropharynx is clear and moist.  Eyes:     Extraocular Movements: EOM normal.     Pupils: Pupils are equal, round, and reactive to light.  Pulmonary:     Effort: Pulmonary effort is normal.  Skin:    General: Skin is warm and dry.  Neurological:     Mental Status: She is alert and oriented to person, place, and time.  Psychiatric:        Mood and Affect: Mood and affect normal.        Behavior: Behavior normal.     Ortho Exam pain with IR and ER.  Has a slight limp with weightbearing.  Prior films demonstrated significant osteoarthritis.  Right knee with medial joint pain.  Full extension and flexed over 100 degrees.  No instability.  Might have a small effusion.  Does have large knees.  Full extension actively no distal edema or popliteal pain  Specialty Comments:  No specialty comments available.  Imaging: XR KNEE 3 VIEW RIGHT  Result Date: 02/09/2020 Films of the right knee were obtained in 3 projections standing.  Normal alignment of the knee.  There is very minimal osteophyte formation in the lateral compartment with some subchondral sclerosis.  No ectopic calcification or acute change.  There are some degenerative changes about the patellofemoral joint as well with osteophytes but the patella tracks in the midline and the joint spaces are well-maintained.  Films are consistent with osteoarthritis    PMFS History: Patient  Active Problem List   Diagnosis Date Noted  . Unilateral primary osteoarthritis, left hip 02/09/2020  . Pain in right knee 02/09/2020  . Palliative care patient 09/18/2018  . Port-A-Cath in place 08/20/2018  . Genetic testing 08/20/2018  . 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 Splenic laceration 07/31/2018  . Family history of uterine cancer   . Family history of breast cancer   . Symptomatic anemia 07/18/2018  . Upper GI bleeding 07/18/2018  . Ovarian cancer, bilateral 07/18/2018  . HCAP (healthcare-associated pneumonia) 07/18/2018  . Pleural effusion 07/07/2018  . Migraine 07/06/2018  . Pleural effusion on left 07/06/2018  . Tobacco abuse 07/06/2018  . Generalized anxiety disorder 05/02/2014  . Unspecified constipation 08/10/2013  . Rectal bleeding 08/10/2013  . Lumbosacral spondylosis without myelopathy 10/23/2011  . Intractable migraine without aura 10/23/2011   Past Medical History:  Diagnosis Date  . Anxiety and depression   . Arthritis of facet joints at multiple vertebral levels    L5-S1  . Constipation   . Dyslipidemia   . Family history of breast cancer   . Family history of uterine cancer   . History of kidney stones   . Insomnia   . Irritable bowel syndrome   . Migraine   . Muscle tension headache   . Neuropathy of finger   . Ovarian carcinoma ([place])   . Plantar fasciitis of right foot   . Port-A-Cath in place 08/20/2018    Family History  Problem Relation Age of Onset  . Hypertension Mother   . Obesity Mother   . Diabetes Mother   . Kidney disease Mother   . Peripheral vascular disease Father   . Atrial fibrillation Father   . COPD Brother   . Osteoporosis Brother   . Crohn's disease Sister   . Uterine cancer Sister 56       maternal half sister  . Breast cancer Maternal Aunt 64  . Colon cancer Neg Hx     Past Surgical History:  Procedure Laterality Date  . CHOLECYSTECTOMY  2008  . COLONOSCOPY N/A 08/13/2013    Procedure: COLONOSCOPY;  Surgeon: Rogene Houston, MD;  [facility];  Service: Endoscopy;  Laterality: N/A;  230-moved to 145 Ann to notify pt  . ESOPHAGOGASTRODUODENOSCOPY    . ESOPHAGOGASTRODUODENOSCOPY (EGD) WITH PROPOFOL N/A 07/20/2018   Procedure: ESOPHAGOGASTRODUODENOSCOPY (EGD) WITH PROPOFOL;  Surgeon: Rogene Houston, MD;  [facility];  Service: Endoscopy;  Laterality: N/A;  Possible esophageal dilation.  . IR ANGIOGRAM SELECTIVE EACH ADDITIONAL VESSEL  08/01/2018  . IR ANGIOGRAM VISCERAL SELECTIVE  08/01/2018  .  IR EMBO ART  VEN HEMORR LYMPH EXTRAV  INC GUIDE ROADMAPPING  08/01/2018  . IR IMAGING GUIDED PORT INSERTION  08/20/2018  . IR PERC PLEURAL DRAIN W/INDWELL CATH W/IMG GUIDE  07/08/2018  . IR THORACENTESIS ASP PLEURAL SPACE W/IMG GUIDE  07/07/2018  . IR US GUIDE VASC ACCESS RIGHT  08/01/2018  . PLEURAL EFFUSION DRAINAGE Left 07/13/2018   Procedure: DRAINAGE OF LOCULATED PLEURAL EFFUSION;  Surgeon: Ivin Poot, MD;  Location: [facility];  Service: Thoracic;  Laterality: Left;  . REMOVAL OF PLEURAL DRAINAGE CATHETER Left 08/20/2018   Procedure: REMOVAL OF PLEURAL DRAINAGE CATHETER;  Surgeon: Ivin Poot, MD;  Location: [facility];  Service: Thoracic;  Laterality: Left;  . REMOVAL OF PLEURAL DRAINAGE CATHETER Left 08/20/2018   Procedure: REMOVAL OF PLEURAL DRAINAGE CATHETER;  Surgeon: Ivin Poot, MD;  Location: [place];  Service: Thoracic;  Laterality: Left;  . TALC PLEURODESIS Left 07/13/2018   Procedure: Talc Pleuradesis;  Surgeon: Prescott Gum, Collier Salina, MD;  Location: [facility];  Service: Thoracic;  Laterality: Left;  . TUBAL LIGATION Bilateral   . UTERINE ABLATION    . VIDEO ASSISTED THORACOSCOPY Left 07/13/2018   Procedure: VIDEO ASSISTED THORACOSCOPY;  Surgeon: Ivin Poot, MD;  Location: [facility];  Service: Thoracic;  Laterality: Left;   Social History   Occupational History    Employer: [employer]  Tobacco Use  . Smoking status: Former Smoker    Packs/day: 0.50     Years: 17.00    Pack years: 8.50    Types: Cigarettes    Quit date: 06/22/2018    Years since quitting: 1.6  . Smokeless tobacco: Never Used  Vaping Use  . Vaping Use: Never used  Substance and Sexual Activity  . Alcohol use: Yes    Alcohol/week: 1.0 standard drink    Types: 1 Glasses of wine per week    Comment: Drinks alcohol on occasion  . Drug use: No  . Sexual activity: Not on file

## 2020-02-10 ENCOUNTER — Telehealth: Payer: Self-pay | Admitting: Radiology

## 2020-02-10 NOTE — Telephone Encounter (Signed)
Dr Erlinda Hong would be performing her hip replacement-I would defer to him re pain meds

## 2020-02-10 NOTE — Telephone Encounter (Signed)
Received call from [place] with [facility] in regards to patient and pain management. She has been being treated for bilateral ovarian cancer since mid 2020 and Dr. Delton Coombes has been managing the pain she has from cancer with Oxycodone 10mg . Instructions have been for no more than 4 tablets/day.  Up until this point, Sion has been able to get by with only 2 tablets, 3 at most, per day.  She called the [facility] this morning stating that she is having to sometimes take 2 tablets at a time now due to her orthopedic pain. Since this pain is separate from the cancer pain that she has, Dr. Delton Coombes would like to know if you could manage her pain from here forward.  The plan would be that he would resume pain management once patient's orthopedic conditions are under control, if patient continues to have pain in regards to cancer. They feel this is acute pain from [place] and that she is not one who wants to take pain medication. She expressed to them that she is the primary caregiver for her son who was in a tragic accident last year and he will be undergoing surgery in March. Kushi plans to hopefully have her hip replaced in April or May.    I did explain to Diane that Dr. Durward Fortes does not usually treat chronic pain, however, I would be glad to get this message to you to see what you could offer patient. She was not sure if there was something that you had in mind that would better help [place].  Please advise. If you would like to discuss with Dr. Vickey Huger, you can reach him by calling Diane at [phone].

## 2020-02-10 NOTE — Telephone Encounter (Signed)
Please see below. Could you call Dr. Delton Coombes or advise?

## 2020-02-10 NOTE — Telephone Encounter (Signed)
Sorry to hear that her hip is causing her more problems which is probably an indication that she needs to have surgery sooner than later.  In terms of the chronic pain management I definitely did not feel comfortable prescribing the amounts of oxycodone that she requires to keep her pain controlled.  We will prescribe her pain medications in addition to what she takes at baseline after surgery for up to 6 weeks if indicated.  Thank you.

## 2020-02-11 ENCOUNTER — Encounter (HOSPITAL_COMMUNITY): Payer: Self-pay

## 2020-02-11 ENCOUNTER — Ambulatory Visit (HOSPITAL_COMMUNITY)
Admission: RE | Admit: 2020-02-11 | Discharge: 2020-02-11 | Disposition: A | Discharge status: Home / Self Care | Payer: Medicaid Other | Source: Ambulatory Visit | Attending: Orthopaedic Surgery | Admitting: Orthopaedic Surgery

## 2020-02-11 ENCOUNTER — Other Ambulatory Visit: Payer: Self-pay

## 2020-02-11 DIAGNOSIS — M25552 Pain in left hip: Secondary | ICD-10-CM | POA: Insufficient documentation

## 2020-02-11 MED ORDER — IOHEXOL 180 MG/ML  SOLN
20.0000 mL | Freq: Once | INTRAMUSCULAR | Status: AC | PRN
  Administered 2020-02-11: 5 mL via INTRA_ARTICULAR

## 2020-02-11 MED ORDER — BUPIVACAINE HCL (PF) 0.5 % IJ SOLN: 10.0000 mL | Freq: Once | INTRAMUSCULAR | Status: DC

## 2020-02-11 MED ORDER — LIDOCAINE HCL (PF) 1 % IJ SOLN
INTRAMUSCULAR | Status: AC
  Administered 2020-02-11: 3 mL
  Filled 2020-02-11: qty 5

## 2020-02-11 MED ORDER — POVIDONE-IODINE 10 % EX SOLN
CUTANEOUS | Status: AC
  Administered 2020-02-11: 1
  Filled 2020-02-11: qty 15

## 2020-02-11 MED ORDER — BUPIVACAINE HCL (PF) 0.5 % IJ SOLN
INTRAMUSCULAR | Status: AC
  Administered 2020-02-11: 4 mL
  Filled 2020-02-11: qty 30

## 2020-02-11 MED ORDER — TRIAMCINOLONE ACETONIDE 40 MG/ML IJ SUSP
INTRAMUSCULAR | Status: AC
  Administered 2020-02-11: 40 mg
  Filled 2020-02-11: qty 1

## 2020-02-11 NOTE — Telephone Encounter (Signed)
[place], Carlon Grier Mitts, RT [place] from [place] called. I relayed the messages and they will initiate a Pain Management Referral

## 2020-02-11 NOTE — Procedures (Signed)
Preprocedure Dx: Chronic LEFT hip pain Postprocedure Dx: Chronic LEFT hip pain Procedure  Fluoroscopically guided LEFT hip joint injection, therapeutic Radiologist:  Dubuc Papas Anesthesia:  3 ml of 1% lidocaine Injectate:  4 ml  Of 0.5%marcaine, 40 mg kenalog Fluoro time:  0 minutes 6 seconds EBL:   None Complications: None

## 2020-03-06 ENCOUNTER — Other Ambulatory Visit (HOSPITAL_COMMUNITY): Payer: Self-pay

## 2020-03-06 DIAGNOSIS — C563 Malignant neoplasm of bilateral ovaries: Secondary | ICD-10-CM

## 2020-03-06 MED ORDER — OXYCODONE HCL 10 MG PO TABS: 10.0000 mg | ORAL_TABLET | ORAL | 0 refills | Status: DC | PRN

## 2020-03-08 ENCOUNTER — Encounter: Payer: Self-pay | Admitting: Orthopaedic Surgery

## 2020-03-08 ENCOUNTER — Ambulatory Visit (INDEPENDENT_AMBULATORY_CARE_PROVIDER_SITE_OTHER): Payer: Medicaid Other | Admitting: Orthopaedic Surgery

## 2020-03-08 ENCOUNTER — Ambulatory Visit (INDEPENDENT_AMBULATORY_CARE_PROVIDER_SITE_OTHER): Payer: Medicaid Other

## 2020-03-08 DIAGNOSIS — M1612 Unilateral primary osteoarthritis, left hip: Secondary | ICD-10-CM | POA: Diagnosis not present

## 2020-03-08 MED ORDER — METHOCARBAMOL 500 MG PO TABS: 500.0000 mg | ORAL_TABLET | Freq: Two times a day (BID) | ORAL | 0 refills | Status: DC | PRN

## 2020-03-08 MED ORDER — PREDNISONE 10 MG (21) PO TBPK: ORAL_TABLET | ORAL | 0 refills | Status: DC

## 2020-03-08 NOTE — Progress Notes (Addendum)
Office Visit Note   Patient: Michelle Holt           Date of Birth: 1970-08-17           MRN: [id number] Visit Date: 03/08/2020              Requested by: [name], MD [place],  [place] [address] PCP: [name], MD   Assessment & Plan: Visit Diagnoses:  1. Unilateral primary osteoarthritis, left hip     Plan: Impression is left hip advanced degenerative joint disease.  We did discuss various treatment options to include repeat cortisone injection in the near future, total hip replacement and oral medications.  She has tried unsuccessfully to alleviate her pain with the home exercise program.  She is unable to perform ADLs at this point without severe pain.  She is at risk for falling due to the hip pain.  She notes that her son has had a TBI and is scheduled for surgery to replace half of the school this coming March.  She will need to wait until June before proceeding with surgery.  In the meantime, I have agreed to call in a steroid pack and muscle relaxer.  She may also call Dr. Junius Roads for repeat cortisone injection as long it has been 3 months from previous injection.  She is also aware that she will need to wait approximately 2 months after injection before proceeding with surgery.  She will follow up with [place] this coming spring to further discuss surgical intervention.  Follow-Up Instructions: Return if symptoms worsen or fail to improve.   Orders:  Orders Placed This Encounter  Procedures  . XR HIP UNILAT W OR W/O PELVIS 2-3 VIEWS LEFT   Meds ordered this encounter  Medications  . predniSONE (STERAPRED UNI-PAK 21 TAB) 10 MG (21) TBPK tablet    Sig: Take as directed    Dispense:  21 tablet    Refill:  0  . methocarbamol (ROBAXIN) 500 MG tablet    Sig: Take 1 tablet (500 mg total) by mouth 2 (two) times daily as needed.    Dispense:  20 tablet    Refill:  0      Procedures: No procedures performed   Clinical Data: No additional  findings.   Subjective: Chief Complaint  Patient presents with  . Left Hip - Pain    HPI patient is a pleasant 50 year old female who comes in today with recurrent left hip pain.  She does have a history of advanced degenerative joint disease to the left hip.  She was seen in our office this past summer where she was referred to Dr. Junius Roads for ultrasound-guided cortisone injection.  She had great relief but this only lasted for a day.  Her symptoms returned when she was seen at [facility] this past January where repeat cortisone injection was performed.  She had significant relief but only lasted for a few weeks.  The pain she is currently having is primarily to the groin and anterior thigh but does radiate down the entire leg.  Worse with hip flexion as well as with ambulation.  She is on chronic oxycodone for stage IV ovarian cancer which is not helping her hip pain.  She denies any numbness, tingling or burning.  Review of Systems as detailed in HPI.  All others reviewed and are negative.   Objective: Vital Signs: LMP  (LMP Unknown)   Physical Exam well-developed well-nourished female no acute distress.  Alert oriented x3.  Ortho Exam left hip exam shows a markedly positive FADIR logroll.  Positive straight leg raise.  She is neurovascularly intact distally.  Specialty Comments:  No specialty comments available.  Imaging: XR HIP UNILAT W OR W/O PELVIS 2-3 VIEWS LEFT  Result Date: 03/08/2020 Advanced degenerative changes to the left hip    PMFS History: Patient Active Problem List   Diagnosis Date Noted  . Unilateral primary osteoarthritis, left hip 02/09/2020  . Pain in right knee 02/09/2020  . Palliative care patient 09/18/2018  . Port-A-Cath in place 08/20/2018  . Genetic testing 08/20/2018  . 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 Splenic laceration 07/31/2018  . Family history of uterine cancer   . Family history of breast cancer   .  Symptomatic anemia 07/18/2018  . Upper GI bleeding 07/18/2018  . Ovarian cancer, bilateral 07/18/2018  . HCAP (healthcare-associated pneumonia) 07/18/2018  . Pleural effusion 07/07/2018  . Migraine 07/06/2018  . Pleural effusion on left 07/06/2018  . Tobacco abuse 07/06/2018  . Generalized anxiety disorder 05/02/2014  . Unspecified constipation 08/10/2013  . Rectal bleeding 08/10/2013  . Lumbosacral spondylosis without myelopathy 10/23/2011  . Intractable migraine without aura 10/23/2011   Past Medical History:  Diagnosis Date  . Anxiety and depression   . Arthritis of facet joints at multiple vertebral levels    L5-S1  . Constipation   . Dyslipidemia   . Family history of breast cancer   . Family history of uterine cancer   . History of kidney stones   . Insomnia   . Irritable bowel syndrome   . Migraine   . Muscle tension headache   . Neuropathy of finger   . Ovarian carcinoma ([place])   . Plantar fasciitis of right foot   . Port-A-Cath in place 08/20/2018    Family History  Problem Relation Age of Onset  . Hypertension Mother   . Obesity Mother   . Diabetes Mother   . Kidney disease Mother   . Peripheral vascular disease Father   . Atrial fibrillation Father   . COPD Brother   . Osteoporosis Brother   . Crohn's disease Sister   . Uterine cancer Sister 68       maternal half sister  . Breast cancer Maternal Aunt 38  . Colon cancer Neg Hx     Past Surgical History:  Procedure Laterality Date  . CHOLECYSTECTOMY  2008  . COLONOSCOPY N/A 08/13/2013   Procedure: COLONOSCOPY;  Surgeon: Rogene Houston, MD;  [facility];  Service: Endoscopy;  Laterality: N/A;  230-moved to 145 Ann to notify pt  . ESOPHAGOGASTRODUODENOSCOPY    . ESOPHAGOGASTRODUODENOSCOPY (EGD) WITH PROPOFOL N/A 07/20/2018   Procedure: ESOPHAGOGASTRODUODENOSCOPY (EGD) WITH PROPOFOL;  Surgeon: Rogene Houston, MD;  [facility];  Service: Endoscopy;  Laterality: N/A;  Possible  esophageal dilation.  . IR ANGIOGRAM SELECTIVE EACH ADDITIONAL VESSEL  08/01/2018  . IR ANGIOGRAM VISCERAL SELECTIVE  08/01/2018  . IR EMBO ART  VEN HEMORR LYMPH EXTRAV  INC GUIDE ROADMAPPING  08/01/2018  . IR IMAGING GUIDED PORT INSERTION  08/20/2018  . IR PERC PLEURAL DRAIN W/INDWELL CATH W/IMG GUIDE  07/08/2018  . IR THORACENTESIS ASP PLEURAL SPACE W/IMG GUIDE  07/07/2018  . IR US GUIDE VASC ACCESS RIGHT  08/01/2018  . PLEURAL EFFUSION DRAINAGE Left 07/13/2018   Procedure: DRAINAGE OF LOCULATED PLEURAL EFFUSION;  Surgeon: Ivin Poot, MD;  Location: [facility];  Service: Thoracic;  Laterality: Left;  . REMOVAL OF PLEURAL DRAINAGE CATHETER Left 08/20/2018   Procedure: REMOVAL OF PLEURAL DRAINAGE CATHETER;  Surgeon: Ivin Poot, MD;  Location: [facility];  Service: Thoracic;  Laterality: Left;  . REMOVAL OF PLEURAL DRAINAGE CATHETER Left 08/20/2018   Procedure: REMOVAL OF PLEURAL DRAINAGE CATHETER;  Surgeon: Ivin Poot, MD;  Location: [facility];  Service: Thoracic;  Laterality: Left;  . TALC PLEURODESIS Left 07/13/2018   Procedure: Talc Pleuradesis;  Surgeon: Prescott Gum, Collier Salina, MD;  Location: [facility];  Service: Thoracic;  Laterality: Left;  . TUBAL LIGATION Bilateral   . UTERINE ABLATION    . VIDEO ASSISTED THORACOSCOPY Left 07/13/2018   Procedure: VIDEO ASSISTED THORACOSCOPY;  Surgeon: Ivin Poot, MD;  [facility];  Service: Thoracic;  Laterality: Left;   Social History   Occupational History    Employer: [employer]  Tobacco Use  . Smoking status: Former Smoker    Packs/day: 0.50    Years: 17.00    Pack years: 8.50    Types: Cigarettes    Quit date: 06/22/2018    Years since quitting: 1.7  . Smokeless tobacco: Never Used  Vaping Use  . Vaping Use: Never used  Substance and Sexual Activity  . Alcohol use: Yes    Alcohol/week: 1.0 standard drink    Types: 1 Glasses of wine per week    Comment: Drinks alcohol on occasion  . Drug use: No  . Sexual activity: Not on file

## 2020-03-13 NOTE — Addendum Note (Signed)
Encounter addended by: Annie Paras on: 03/13/2020 11:50 AM  Actions taken: Imaging Exam ended, Charge Capture section accepted

## 2020-03-14 ENCOUNTER — Telehealth: Payer: Self-pay | Admitting: Orthopaedic Surgery

## 2020-03-14 NOTE — Telephone Encounter (Signed)
Patient called requesting a call back from [place]. Patient states she has medication problems. Please call patient at [phone].

## 2020-03-15 ENCOUNTER — Other Ambulatory Visit (HOSPITAL_COMMUNITY): Payer: Self-pay

## 2020-03-15 ENCOUNTER — Telehealth: Payer: Self-pay | Admitting: Orthopaedic Surgery

## 2020-03-15 MED ORDER — LIDOCAINE 5 % EX PTCH: 1.0000 | MEDICATED_PATCH | CUTANEOUS | 0 refills | Status: DC

## 2020-03-15 NOTE — Telephone Encounter (Signed)
See other message

## 2020-03-15 NOTE — Telephone Encounter (Signed)
Takes Robaxin and is not helping. States she has pain in Knee down into ankle and is numb. States it hurts to WB.  Takes Oxycodone 10mg - for other issues.     Would like another muscle relaxer sent into pharm.    Pharmacy [facility] in [place].

## 2020-03-15 NOTE — Telephone Encounter (Signed)
Patient called requesting a call back. Patient called yesterday and has medical questions. Please call patient at [phone].

## 2020-03-16 ENCOUNTER — Other Ambulatory Visit: Payer: Self-pay | Admitting: Physician Assistant

## 2020-03-16 MED ORDER — TIZANIDINE HCL 4 MG PO TABS: 4.0000 mg | ORAL_TABLET | Freq: Two times a day (BID) | ORAL | 0 refills | Status: DC | PRN

## 2020-03-16 NOTE — Telephone Encounter (Signed)
Called patient no answer  LMOM with Details. See message below.

## 2020-03-16 NOTE — Telephone Encounter (Signed)
Sent in a different muscle relaxer.  May also refer to dr hilts for repeat cortisone injection once it has been 3 months from last inj

## 2020-03-17 NOTE — Telephone Encounter (Signed)
Sent mychart msg.

## 2020-03-27 ENCOUNTER — Other Ambulatory Visit: Payer: Self-pay | Admitting: Physician Assistant

## 2020-03-27 MED ORDER — TIZANIDINE HCL 4 MG PO TABS: 4.0000 mg | ORAL_TABLET | Freq: Three times a day (TID) | ORAL | 0 refills | Status: DC | PRN

## 2020-03-27 MED ORDER — METHOCARBAMOL 500 MG PO TABS: 500.0000 mg | ORAL_TABLET | Freq: Three times a day (TID) | ORAL | 0 refills | Status: DC | PRN

## 2020-03-27 NOTE — Telephone Encounter (Signed)
I sent in

## 2020-03-27 NOTE — Telephone Encounter (Signed)
Sent in.  Make sure she knows not to also pick up the robaxin

## 2020-04-05 ENCOUNTER — Other Ambulatory Visit (HOSPITAL_COMMUNITY): Payer: Self-pay

## 2020-04-05 DIAGNOSIS — C563 Malignant neoplasm of bilateral ovaries: Secondary | ICD-10-CM

## 2020-04-05 MED ORDER — OXYCODONE HCL 10 MG PO TABS: 10.0000 mg | ORAL_TABLET | ORAL | 0 refills | Status: DC | PRN

## 2020-04-12 ENCOUNTER — Other Ambulatory Visit: Payer: Self-pay | Admitting: Physician Assistant

## 2020-04-12 MED ORDER — PREDNISONE 5 MG (21) PO TBPK: ORAL_TABLET | ORAL | 0 refills | Status: DC

## 2020-04-12 NOTE — Telephone Encounter (Signed)
Sent in

## 2020-04-13 NOTE — Telephone Encounter (Signed)
Hi Debbie.  I spoke to Michelle Holt tonight and she would like to have surgery as soon as possible so I looked at the calendar and told her that may second may be a possibility but I wanted to check with you first.  When you get a chance can you reach out to her to schedule surgery please.  Thank you.  1.  Left hip DJD M16.12 2.  Left THA 27130 3.  Depuy 4.  Spinal 5.  [facility] 6.  23 hr obs

## 2020-04-14 NOTE — Telephone Encounter (Signed)
Michelle Holt, she changed her mind and she would like to do it on April 11th if it's possible.  Thank you.

## 2020-04-19 NOTE — Progress Notes (Signed)
Surgical Instructions    Your procedure is scheduled on 04/24/20.  Report to [facility] Main Entrance "A" at 8:00 A.M., then check in with the Admitting office.  Call this number if you have problems the morning of surgery:  [phone]   If you have any questions prior to your surgery date call [phone]: Open Monday-Friday 8am-4pm    Remember:  Do not eat after midnight the night before your surgery  You may drink clear liquids until 7:00 the morning of your surgery.   Clear liquids allowed are: Water, Non-Citrus Juices (without pulp), Carbonated Beverages, Clear Tea, Black Coffee Only, and Gatorade  Please complete your PRE-SURGERY ENSURE that was provided to you by 7:00 the morning of surgery.  Please, if able, drink it in one setting. DO NOT SIP.    Take these medicines the morning of surgery with A SIP OF WATER: pantoprazole (PROTONIX) ondansetron (ZOFRAN-ODT) - as needed Oxycodone - as needed SUMAtriptan (IMITREX) - as needed tiZANidine (ZANAFLEX) - as needed   As of today, STOP taking any Aspirin (unless otherwise instructed by your surgeon) Aleve, Naproxen, Ibuprofen, Motrin, Advil, Goody's, BC's, all herbal medications, fish oil, and all vitamins.                     Do not wear jewelry, make up, or nail polish            Do not wear lotions, powders, perfumes or deodorant.            Do not shave 48 hours prior to surgery.              Do not bring valuables to the hospital.            [facility] is not responsible for any belongings or valuables.  Do NOT Smoke (Tobacco/Vaping) or drink Alcohol 24 hours prior to your procedure If you use a CPAP at night, you may bring all equipment for your overnight stay.   Contacts, glasses, dentures or bridgework may not be worn into surgery, please bring cases for these belongings   For patients admitted to the hospital, discharge time will be determined by your treatment team.   Patients discharged the day of surgery will  not be allowed to drive home, and someone needs to stay with them for 24 hours.    Special instructions:   [place]- Preparing For Surgery  Before surgery, you can play an important role. Because skin is not sterile, your skin needs to be as free of germs as possible. You can reduce the number of germs on your skin by washing with CHG (chlorahexidine gluconate) Soap before surgery.  CHG is an antiseptic cleaner which kills germs and bonds with the skin to continue killing germs even after washing.    Oral Hygiene is also important to reduce your risk of infection.  Remember - BRUSH YOUR TEETH THE MORNING OF SURGERY WITH YOUR REGULAR TOOTHPASTE  Please do not use if you have an allergy to CHG or antibacterial soaps. If your skin becomes reddened/irritated stop using the CHG.  Do not shave (including legs and underarms) for at least 48 hours prior to first CHG shower. It is OK to shave your face.  Please follow these instructions carefully.   1. Shower the NIGHT BEFORE SURGERY and the MORNING OF SURGERY  2. If you chose to wash your hair, wash your hair first as usual with your normal shampoo.  3. After you shampoo,  rinse your hair and body thoroughly to remove the shampoo.  4. Wash Face and genitals (private parts) with your normal soap.   5.  Shower the NIGHT BEFORE SURGERY and the MORNING OF SURGERY with CHG Soap.   6. Use CHG Soap as you would any other liquid soap. You can apply CHG directly to the skin and wash gently with a scrungie or a clean washcloth.   7. Apply the CHG Soap to your body ONLY FROM THE NECK DOWN.  Do not use on open wounds or open sores. Avoid contact with your eyes, ears, mouth and genitals (private parts). Wash Face and genitals (private parts)  with your normal soap.   8. Wash thoroughly, paying special attention to the area where your surgery will be performed.  9. Thoroughly rinse your body with warm water from the neck down.  10. DO NOT shower/wash  with your normal soap after using and rinsing off the CHG Soap.  11. Pat yourself dry with a CLEAN TOWEL.  12. Wear CLEAN PAJAMAS to bed the night before surgery  13. Place CLEAN SHEETS on your bed the night before your surgery  14. DO NOT SLEEP WITH PETS.   Day of Surgery: Wear Clean/Comfortable clothing the morning of surgery Do not apply any deodorants/lotions.   Remember to brush your teeth WITH YOUR REGULAR TOOTHPASTE.   Please read over the following fact sheets that you were given.

## 2020-04-20 ENCOUNTER — Encounter (HOSPITAL_COMMUNITY): Payer: Self-pay | Admitting: Physician Assistant

## 2020-04-20 ENCOUNTER — Encounter (HOSPITAL_COMMUNITY): Payer: Self-pay

## 2020-04-20 ENCOUNTER — Encounter (HOSPITAL_COMMUNITY): Payer: Self-pay | Admitting: Anesthesiology

## 2020-04-20 ENCOUNTER — Other Ambulatory Visit: Payer: Self-pay

## 2020-04-20 ENCOUNTER — Telehealth: Payer: Self-pay | Admitting: Orthopaedic Surgery

## 2020-04-20 ENCOUNTER — Other Ambulatory Visit (HOSPITAL_COMMUNITY): Payer: Medicaid Other

## 2020-04-20 ENCOUNTER — Other Ambulatory Visit: Payer: Self-pay | Admitting: Physician Assistant

## 2020-04-20 ENCOUNTER — Encounter (HOSPITAL_COMMUNITY)
Admission: RE | Admit: 2020-04-20 | Discharge: 2020-04-20 | Disposition: A | Discharge status: Home / Self Care | Payer: Medicaid Other | Source: Ambulatory Visit | Attending: Orthopaedic Surgery | Admitting: Orthopaedic Surgery

## 2020-04-20 ENCOUNTER — Encounter (HOSPITAL_COMMUNITY)
Admission: RE | Admit: 2020-04-20 | Discharge: 2020-04-20 | Disposition: A | Discharge status: Home / Self Care | Payer: Medicaid Other | Source: Ambulatory Visit | Attending: Physician Assistant | Admitting: Physician Assistant

## 2020-04-20 DIAGNOSIS — Z20822 Contact with and (suspected) exposure to covid-19: Secondary | ICD-10-CM | POA: Diagnosis not present

## 2020-04-20 DIAGNOSIS — M1612 Unilateral primary osteoarthritis, left hip: Secondary | ICD-10-CM

## 2020-04-20 DIAGNOSIS — Z01818 Encounter for other preprocedural examination: Secondary | ICD-10-CM | POA: Insufficient documentation

## 2020-04-20 LAB — CBC WITH DIFFERENTIAL/PLATELET
Abs Immature Granulocytes: 0.2 10*3/uL — ABNORMAL HIGH (ref 0.00–0.07)
Basophils Absolute: 0.1 10*3/uL (ref 0.0–0.1)
Basophils Relative: 1 %
Eosinophils Absolute: 0.3 10*3/uL (ref 0.0–0.5)
Eosinophils Relative: 2 %
HCT: 24.7 % — ABNORMAL LOW (ref 36.0–46.0)
Hemoglobin: 6.9 g/dL — CL (ref 12.0–15.0)
Immature Granulocytes: 1 %
Lymphocytes Relative: 21 %
Lymphs Abs: 3 10*3/uL (ref 0.7–4.0)
MCH: 24 pg — ABNORMAL LOW (ref 26.0–34.0)
MCHC: 27.9 g/dL — ABNORMAL LOW (ref 30.0–36.0)
MCV: 86.1 fL (ref 80.0–100.0)
Monocytes Absolute: 1.1 10*3/uL — ABNORMAL HIGH (ref 0.1–1.0)
Monocytes Relative: 8 %
Neutro Abs: 9.6 10*3/uL — ABNORMAL HIGH (ref 1.7–7.7)
Neutrophils Relative %: 67 %
Platelets: 611 10*3/uL — ABNORMAL HIGH (ref 150–400)
RBC: 2.87 MIL/uL — ABNORMAL LOW (ref 3.87–5.11)
RDW: 17.5 % — ABNORMAL HIGH (ref 11.5–15.5)
WBC: 14.3 10*3/uL — ABNORMAL HIGH (ref 4.0–10.5)
nRBC: 0.2 % (ref 0.0–0.2)

## 2020-04-20 LAB — URINALYSIS, ROUTINE W REFLEX MICROSCOPIC
Bilirubin Urine: NEGATIVE
Glucose, UA: NEGATIVE mg/dL
Hgb urine dipstick: NEGATIVE
Ketones, ur: 5 mg/dL — AB
Leukocytes,Ua: NEGATIVE
Nitrite: NEGATIVE
Protein, ur: 30 mg/dL — AB
Specific Gravity, Urine: 1.027 (ref 1.005–1.030)
pH: 5 (ref 5.0–8.0)

## 2020-04-20 LAB — APTT: aPTT: 27 seconds (ref 24–36)

## 2020-04-20 LAB — TYPE AND SCREEN
ABO/RH(D): O POS
Antibody Screen: NEGATIVE

## 2020-04-20 LAB — COMPREHENSIVE METABOLIC PANEL
ALT: 18 U/L (ref 0–44)
AST: 24 U/L (ref 15–41)
Albumin: 3.6 g/dL (ref 3.5–5.0)
Alkaline Phosphatase: 96 U/L (ref 38–126)
Anion gap: 9 (ref 5–15)
BUN: 18 mg/dL (ref 6–20)
CO2: 26 mmol/L (ref 22–32)
Calcium: 9.6 mg/dL (ref 8.9–10.3)
Chloride: 101 mmol/L (ref 98–111)
Creatinine, Ser: 0.84 mg/dL (ref 0.44–1.00)
GFR, Estimated: >60 mL/min (ref >60)
Glucose, Bld: 90 mg/dL (ref 70–99)
Potassium: 3.8 mmol/L (ref 3.5–5.1)
Sodium: 136 mmol/L (ref 135–145)
Total Bilirubin: 0.5 mg/dL (ref 0.3–1.2)
Total Protein: 6.5 g/dL (ref 6.5–8.1)

## 2020-04-20 LAB — SURGICAL PCR SCREEN
MRSA, PCR: NEGATIVE
Staphylococcus aureus: NEGATIVE

## 2020-04-20 LAB — PROTIME-INR
INR: 1 (ref 0.8–1.2)
Prothrombin Time: 12.7 seconds (ref 11.4–15.2)

## 2020-04-20 LAB — SARS CORONAVIRUS 2 (TAT 6-24 HRS): SARS Coronavirus 2: NEGATIVE

## 2020-04-20 MED ORDER — METHOCARBAMOL 500 MG PO TABS: 500.0000 mg | ORAL_TABLET | Freq: Two times a day (BID) | ORAL | 0 refills | Status: DC | PRN

## 2020-04-20 MED ORDER — OXYCODONE-ACETAMINOPHEN 7.5-325 MG PO TABS: ORAL_TABLET | ORAL | 0 refills | Status: DC

## 2020-04-20 MED ORDER — DOCUSATE SODIUM 100 MG PO CAPS: 100.0000 mg | ORAL_CAPSULE | Freq: Every day | ORAL | 2 refills | Status: AC | PRN

## 2020-04-20 MED ORDER — RIVAROXABAN 10 MG PO TABS: 10.0000 mg | ORAL_TABLET | Freq: Every day | ORAL | 0 refills | Status: DC

## 2020-04-20 MED ORDER — ONDANSETRON HCL 4 MG PO TABS: 4.0000 mg | ORAL_TABLET | Freq: Three times a day (TID) | ORAL | 0 refills | Status: DC | PRN

## 2020-04-20 NOTE — Telephone Encounter (Signed)
Matt from [employer] called requesting a call back concerning patient's oxycodone dosage. Please call Matt at [phone].

## 2020-04-20 NOTE — Progress Notes (Addendum)
Anesthesia Chart Review:  I spoke with the patient at her preadmission testing appointment regarding her report of recent symptoms of fatigue.  Specifically, she reports fatigue and sensation of racing heart when taking a shower.  She does state that showering is currently quite difficult due to severe hip pain and deconditioning from lack of activity.  She also reports that she feels similarly fatigued after ambulating around her house with her walker and reports a "heaviness" in her arms.  These symptoms resolve quickly with rest.  She states for the past 2 weeks she has essentially been immobilized by the severe pain in her hip.  She denies any chest pain, nausea/vomiting.  She does have some DOE, but feels it is from deconditioning and weight gain. She did undergo cardiac work-up in 2017 for atypical chest pain.  Stress echocardiogram 08/17/2015 was normal, no echocardiographic evidence for stress-induced ischemia.  No further cardiac work-up was recommended. On my exam she is in a wheelchair due to hip pain.  She is in no acute distress, breathing is unlabored.  She is in some discomfort due to her hip.  Other than hip pain she reports that she is feeling well, no other complaints.  Auscultation reveals heart with regular rate and rhythm, lungs CTAB.  EKG shows normal sinus rhythm, rate 80.  Discussed with patient that her symptoms are likely explained by her severe hip pain and progressive deconditioning due to lack of activity.  She does understand that if she develops any chest pain or other cardiopulmonary complaints she is to contact her PCP and/or seek urgent medical attention.    Addendum 04/20/20 @ 14:42: Pt's preop labs resulted with critical low for Hgb, 6.9, which is down from 11.2 on 01/26/20. Likely contributing to pt's recent fatigue. This result was immediately called to Dr. Phoebe Sharps office.  EKG 04/20/2020: Normal sinus rhythm.  Rate 80.  Stress echo 08/17/2015: - Stress ECG conclusions: The  stress ECG was negative for ischemia.  [facility] scoring: exercise time of 7 min; maximum ST deviation of 0  mm; no angina; resulting score is 7. This score predicts a low  risk of cardiac events.  - Staged echo: There was no echocardiographic evidence for  stress-induced ischemia.    Wynonia Musty [facility] Short Stay Center/Anesthesiology Phone [phone] 04/20/2020 1:01 PM

## 2020-04-20 NOTE — Telephone Encounter (Signed)
Yes correct.  Thanks.

## 2020-04-20 NOTE — Telephone Encounter (Signed)
Actually surgery for Monday is going to be cancelled.  She can get her normal oxycodone

## 2020-04-20 NOTE — Telephone Encounter (Signed)
So do not fill Oxycodone that was prescribed today. Only fill Oxycodone from 3/23 from Dr. Derek Jack correct?

## 2020-04-20 NOTE — Progress Notes (Signed)
CRITICAL VALUE STICKER  CRITICAL VALUE: hbg 6.9  RECEIVER (on-site recipient of call): Bobbie Stack, RN, short stay.  Message relayed to this RN  DATE & TIME NOTIFIED: 04-20-20 @1421   MESSENGER (representative from lab):   MD NOTIFIED: Baldwin Crown with Anesthesia notified  TIME OF NOTIFICATION:1430  RESPONSE: Jeneen Rinks will call Dr. Phoebe Sharps office

## 2020-04-20 NOTE — Telephone Encounter (Signed)
Looks like Michelle Jack, MD prescribed Oxycodone on 04/05/2020. Today Lindsey prescribed Oxycodone. Patient will be having surgery Monday. Okay to fill today's Rx?

## 2020-04-20 NOTE — Progress Notes (Signed)
PCP: Dr Woody Seller Cardiologist: Denies  EKG: Today CXR: Today ECHO:08/17/2015 Stress Test: Denies Cardiac Cath: Denies  Covid tested during appt, aware to quarantine until surgery  ERAS Ensure provided  Patient denies fever, cough, and chest pain at PAT appointment.  Pt reports SOB, fast heart rate, bilateral arm "heaviness" for the past two days.  Reports these symptoms only occur during ambulation with walker in the home or when showering.  Reports 8/10 pain in hip the majority of the time.  Anesthesia made aware.  Baldwin Crown to come see pt.  Patient verbalized understanding of instructions provided today at the PAT appointment.  Patient asked to review instructions at home and day of surgery.

## 2020-04-20 NOTE — Telephone Encounter (Signed)
Called Matt to advise to cx rx for oxy prescribed by  [employer]

## 2020-04-24 ENCOUNTER — Other Ambulatory Visit (HOSPITAL_COMMUNITY): Payer: Medicaid Other

## 2020-04-24 ENCOUNTER — Encounter (HOSPITAL_COMMUNITY): Payer: Self-pay

## 2020-04-24 ENCOUNTER — Encounter (HOSPITAL_COMMUNITY): Admission: RE | Payer: Self-pay | Source: Home / Self Care

## 2020-04-24 ENCOUNTER — Other Ambulatory Visit: Payer: Self-pay

## 2020-04-24 ENCOUNTER — Ambulatory Visit (HOSPITAL_COMMUNITY): Admission: RE | Admit: 2020-04-24 | Payer: Medicaid Other | Source: Home / Self Care | Admitting: Orthopaedic Surgery

## 2020-04-24 ENCOUNTER — Inpatient Hospital Stay (HOSPITAL_COMMUNITY): Payer: Medicaid Other | Attending: Hematology

## 2020-04-24 VITALS — BP 113/75 | HR 104 | Temp 97.2°F | Resp 19

## 2020-04-24 DIAGNOSIS — D649 Anemia, unspecified: Secondary | ICD-10-CM | POA: Insufficient documentation

## 2020-04-24 DIAGNOSIS — Z452 Encounter for adjustment and management of vascular access device: Secondary | ICD-10-CM | POA: Insufficient documentation

## 2020-04-24 DIAGNOSIS — Z8543 Personal history of malignant neoplasm of ovary: Secondary | ICD-10-CM | POA: Diagnosis present

## 2020-04-24 DIAGNOSIS — C563 Malignant neoplasm of bilateral ovaries: Secondary | ICD-10-CM

## 2020-04-24 LAB — VITAMIN B12: Vitamin B-12: 267 pg/mL (ref 180–914)

## 2020-04-24 LAB — CBC WITH DIFFERENTIAL/PLATELET
Abs Immature Granulocytes: 0.04 10*3/uL (ref 0.00–0.07)
Basophils Absolute: 0.1 10*3/uL (ref 0.0–0.1)
Basophils Relative: 1 %
Eosinophils Absolute: 0.2 10*3/uL (ref 0.0–0.5)
Eosinophils Relative: 2 %
HCT: 24.6 % — ABNORMAL LOW (ref 36.0–46.0)
Hemoglobin: 6.9 g/dL — CL (ref 12.0–15.0)
Immature Granulocytes: 1 %
Lymphocytes Relative: 21 %
Lymphs Abs: 1.7 10*3/uL (ref 0.7–4.0)
MCH: 23.5 pg — ABNORMAL LOW (ref 26.0–34.0)
MCHC: 28 g/dL — ABNORMAL LOW (ref 30.0–36.0)
MCV: 84 fL (ref 80.0–100.0)
Monocytes Absolute: 0.8 10*3/uL (ref 0.1–1.0)
Monocytes Relative: 10 %
Neutro Abs: 5.4 10*3/uL (ref 1.7–7.7)
Neutrophils Relative %: 65 %
Platelets: 483 10*3/uL — ABNORMAL HIGH (ref 150–400)
RBC: 2.93 MIL/uL — ABNORMAL LOW (ref 3.87–5.11)
RDW: 17.5 % — ABNORMAL HIGH (ref 11.5–15.5)
WBC: 8.1 10*3/uL (ref 4.0–10.5)
nRBC: 0.4 % — ABNORMAL HIGH (ref 0.0–0.2)

## 2020-04-24 LAB — COMPREHENSIVE METABOLIC PANEL
ALT: 22 U/L (ref 0–44)
AST: 22 U/L (ref 15–41)
Albumin: 3.6 g/dL (ref 3.5–5.0)
Alkaline Phosphatase: 113 U/L (ref 38–126)
Anion gap: 10 (ref 5–15)
BUN: 14 mg/dL (ref 6–20)
CO2: 27 mmol/L (ref 22–32)
Calcium: 9.4 mg/dL (ref 8.9–10.3)
Chloride: 101 mmol/L (ref 98–111)
Creatinine, Ser: 0.81 mg/dL (ref 0.44–1.00)
GFR, Estimated: >60 mL/min (ref >60)
Glucose, Bld: 101 mg/dL — ABNORMAL HIGH (ref 70–99)
Potassium: 4 mmol/L (ref 3.5–5.1)
Sodium: 138 mmol/L (ref 135–145)
Total Bilirubin: 0.5 mg/dL (ref 0.3–1.2)
Total Protein: 7.1 g/dL (ref 6.5–8.1)

## 2020-04-24 LAB — FOLATE: Folate: 27.8 ng/mL (ref >5.9)

## 2020-04-24 LAB — IRON AND TIBC
Iron: 9 ug/dL — ABNORMAL LOW (ref 28–170)
Saturation Ratios: 1 % — ABNORMAL LOW (ref 10.4–31.8)
TIBC: 644 ug/dL — ABNORMAL HIGH (ref 250–450)
UIBC: 635 ug/dL

## 2020-04-24 LAB — FERRITIN: Ferritin: 10 ng/mL — ABNORMAL LOW (ref 11–307)

## 2020-04-24 LAB — PREPARE RBC (CROSSMATCH)

## 2020-04-24 SURGERY — ARTHROPLASTY, HIP, TOTAL, ANTERIOR APPROACH
Anesthesia: Spinal | Site: Hip | Laterality: Left

## 2020-04-24 MED ORDER — SODIUM CHLORIDE 0.9% FLUSH
10.0000 mL | Freq: Once | INTRAVENOUS | Status: AC
  Administered 2020-04-24: 10 mL via INTRAVENOUS

## 2020-04-24 MED ORDER — HEPARIN SOD (PORK) LOCK FLUSH 100 UNIT/ML IV SOLN
500.0000 [IU] | Freq: Once | INTRAVENOUS | Status: AC
  Administered 2020-04-24: 500 [IU] via INTRAVENOUS

## 2020-04-24 NOTE — Addendum Note (Signed)
Addended by: Benjiman Core D on: 04/24/2020 01:50 PM   Modules accepted: Orders

## 2020-04-24 NOTE — Addendum Note (Signed)
Addended by: Benjiman Core D on: 04/24/2020 12:30 PM   Modules accepted: Orders, SmartSet

## 2020-04-24 NOTE — Progress Notes (Signed)
Patients port flushed without difficulty.  No blood return noted, but patient had no pain with flushing and no bruising or swelling noted at site.  Band aid applied.  VSS with discharge and left in a wheelchair with no s/s of distress noted.

## 2020-04-25 ENCOUNTER — Inpatient Hospital Stay (HOSPITAL_BASED_OUTPATIENT_CLINIC_OR_DEPARTMENT_OTHER): Payer: Medicaid Other

## 2020-04-25 ENCOUNTER — Encounter (HOSPITAL_COMMUNITY): Payer: Self-pay

## 2020-04-25 VITALS — BP 109/50 | HR 87 | Temp 97.5°F | Resp 18

## 2020-04-25 DIAGNOSIS — Z452 Encounter for adjustment and management of vascular access device: Secondary | ICD-10-CM | POA: Diagnosis not present

## 2020-04-25 DIAGNOSIS — C563 Malignant neoplasm of bilateral ovaries: Secondary | ICD-10-CM

## 2020-04-25 DIAGNOSIS — D649 Anemia, unspecified: Secondary | ICD-10-CM

## 2020-04-25 LAB — CA 125: Cancer Antigen (CA) 125: 7.4 U/mL (ref 0.0–38.1)

## 2020-04-25 MED ORDER — SODIUM CHLORIDE 0.9 % IV SOLN: Freq: Once | INTRAVENOUS | Status: AC

## 2020-04-25 MED ORDER — SODIUM CHLORIDE 0.9% FLUSH
10.0000 mL | INTRAVENOUS | Status: AC | PRN
  Administered 2020-04-25: 10 mL

## 2020-04-25 MED ORDER — SODIUM CHLORIDE 0.9% FLUSH
10.0000 mL | Freq: Once | INTRAVENOUS | Status: AC
  Administered 2020-04-25: 10 mL via INTRAVENOUS

## 2020-04-25 MED ORDER — DIPHENHYDRAMINE HCL 25 MG PO CAPS
25.0000 mg | ORAL_CAPSULE | Freq: Once | ORAL | Status: AC
  Administered 2020-04-25: 25 mg via ORAL

## 2020-04-25 MED ORDER — HEPARIN SOD (PORK) LOCK FLUSH 100 UNIT/ML IV SOLN
500.0000 [IU] | Freq: Once | INTRAVENOUS | Status: AC
Start: 2020-04-25 — End: 2020-04-25
  Administered 2020-04-25: 500 [IU] via INTRAVENOUS

## 2020-04-25 MED ORDER — FAMOTIDINE 20 MG PO TABS
20.0000 mg | ORAL_TABLET | Freq: Once | ORAL | Status: AC
  Administered 2020-04-25: 20 mg via ORAL
  Filled 2020-04-25: qty 1

## 2020-04-25 MED ORDER — SODIUM CHLORIDE 0.9 % IV SOLN
510.0000 mg | Freq: Once | INTRAVENOUS | Status: AC
  Administered 2020-04-25: 510 mg via INTRAVENOUS
  Filled 2020-04-25: qty 510

## 2020-04-25 MED ORDER — DIPHENHYDRAMINE HCL 25 MG PO CAPS
ORAL_CAPSULE | ORAL | Status: AC
  Filled 2020-04-25: qty 1

## 2020-04-25 MED ORDER — SODIUM CHLORIDE 0.9% IV SOLUTION
250.0000 mL | Freq: Once | INTRAVENOUS | Status: AC
  Administered 2020-04-25: 250 mL via INTRAVENOUS

## 2020-04-25 NOTE — Progress Notes (Signed)
Patient took own Tylenol from home for today's blood transfusion.

## 2020-04-25 NOTE — Patient Instructions (Addendum)
Ferumoxytol injection What is this medicine? FERUMOXYTOL is an iron complex. Iron is used to make healthy red blood cells, which carry oxygen and nutrients throughout the body. This medicine is used to treat iron deficiency anemia. This medicine may be used for other purposes; ask your health care provider or pharmacist if you have questions. COMMON BRAND NAME(S): Feraheme What should I tell my health care provider before I take this medicine? They need to know if you have any of these conditions:  anemia not caused by low iron levels  high levels of iron in the blood  magnetic resonance imaging (MRI) test scheduled  an unusual or allergic reaction to iron, other medicines, foods, dyes, or preservatives  pregnant or trying to get pregnant  breast-feeding How should I use this medicine? This medicine is for injection into a vein. It is given by a health care professional in a hospital or clinic setting. Talk to your pediatrician regarding the use of this medicine in children. Special care may be needed. Overdosage: If you think you have taken too much of this medicine contact a poison control center or emergency room at once. NOTE: This medicine is only for you. Do not share this medicine with others. What if I miss a dose? It is important not to miss your dose. Call your doctor or health care professional if you are unable to keep an appointment. What may interact with this medicine? This medicine may interact with the following medications:  other iron products This list may not describe all possible interactions. Give your health care provider a list of all the medicines, herbs, non-prescription drugs, or dietary supplements you use. Also tell them if you smoke, drink alcohol, or use illegal drugs. Some items may interact with your medicine. What should I watch for while using this medicine? Visit your doctor or healthcare professional regularly. Tell your doctor or healthcare  professional if your symptoms do not start to get better or if they get worse. You may need blood work done while you are taking this medicine. You may need to follow a special diet. Talk to your doctor. Foods that contain iron include: whole grains/cereals, dried fruits, beans, or peas, leafy green vegetables, and organ meats (liver, kidney). What side effects may I notice from receiving this medicine? Side effects that you should report to your doctor or health care professional as soon as possible:  allergic reactions like skin rash, itching or hives, swelling of the face, lips, or tongue  breathing problems  changes in blood pressure  feeling faint or lightheaded, falls  fever or chills  flushing, sweating, or hot feelings  swelling of the ankles or feet Side effects that usually do not require medical attention (report to your doctor or health care professional if they continue or are bothersome):  diarrhea  headache  nausea, vomiting  stomach pain This list may not describe all possible side effects. Call your doctor for medical advice about side effects. You may report side effects to FDA at 1-800-FDA-1088. Where should I keep my medicine? This drug is given in a hospital or clinic and will not be stored at home. NOTE: This sheet is a summary. It may not cover all possible information. If you have questions about this medicine, talk to your doctor, pharmacist, or health care provider.  2021 Elsevier/Gold Standard (2016-02-19 20:21:10) [place] at [facility]  Discharge Instructions:  You received 1 unit of blood and an iron infusion today, Fecal  occult cards (x3) were given. Return as scheduled. _______________________________________________________________  Thank you for choosing [place] at [facility] to provide your oncology and hematology care.  To afford each patient quality time with our providers, please arrive  at least 15 minutes before your scheduled appointment.  You need to re-schedule your appointment if you arrive 10 or more minutes late.  We strive to give you quality time with our providers, and arriving late affects you and other patients whose appointments are after yours.  Also, if you no show three or more times for appointments you may be dismissed from the clinic.  Again, thank you for choosing [place] at [facility] hope is that these requests will allow you access to exceptional care and in a timely manner. _______________________________________________________________  If you have questions after your visit, please contact our office at [phone] between the hours of 8:30 a.m. and 5:00 p.m. Voicemails left after 4:30 p.m. will not be returned until the following business day. _______________________________________________________________  For prescription refill requests, have your pharmacy contact our office. _______________________________________________________________  Recommendations made by the consultant and any test results will be sent to your referring physician. _______________________________________________________________

## 2020-04-25 NOTE — Progress Notes (Signed)
Patient tolerated blood transfusion with no complaints. Blood consent signed. Port flushed with good blood return noted.   Patient tolerated iron infusion with no complaints voiced. Port site clean and dry with good blood return noted before and after infusion. Band aid applied. VSS with discharge and left in satisfactory condition with no s/s of distress noted.   Fecal Occult cards x3 given to patient to take home, reviewed instructions, all questions verbalized and answered.

## 2020-04-26 LAB — TYPE AND SCREEN
ABO/RH(D): O POS
Antibody Screen: NEGATIVE
Unit division: 0

## 2020-04-26 LAB — BPAM RBC
Blood Product Expiration Date: 202205102359
ISSUE DATE / TIME: 202204121000
Unit Type and Rh: 5100

## 2020-04-28 ENCOUNTER — Ambulatory Visit (HOSPITAL_COMMUNITY): Payer: Medicaid Other

## 2020-05-02 ENCOUNTER — Other Ambulatory Visit (HOSPITAL_COMMUNITY): Payer: Self-pay | Admitting: *Deleted

## 2020-05-02 ENCOUNTER — Inpatient Hospital Stay (HOSPITAL_COMMUNITY): Payer: Medicaid Other

## 2020-05-02 ENCOUNTER — Other Ambulatory Visit (HOSPITAL_COMMUNITY): Payer: Self-pay | Admitting: Surgery

## 2020-05-02 ENCOUNTER — Other Ambulatory Visit: Payer: Self-pay

## 2020-05-02 ENCOUNTER — Encounter (HOSPITAL_COMMUNITY): Payer: Self-pay

## 2020-05-02 VITALS — BP 121/64 | HR 74 | Temp 96.6°F | Resp 16

## 2020-05-02 DIAGNOSIS — C563 Malignant neoplasm of bilateral ovaries: Secondary | ICD-10-CM

## 2020-05-02 DIAGNOSIS — Z95828 Presence of other vascular implants and grafts: Secondary | ICD-10-CM

## 2020-05-02 DIAGNOSIS — D649 Anemia, unspecified: Secondary | ICD-10-CM

## 2020-05-02 DIAGNOSIS — Z452 Encounter for adjustment and management of vascular access device: Secondary | ICD-10-CM | POA: Diagnosis not present

## 2020-05-02 LAB — OCCULT BLOOD X 1 CARD TO LAB, STOOL
Fecal Occult Bld: NEGATIVE
Fecal Occult Bld: NEGATIVE
Fecal Occult Bld: NEGATIVE

## 2020-05-02 MED ORDER — HEPARIN SOD (PORK) LOCK FLUSH 100 UNIT/ML IV SOLN
500.0000 [IU] | Freq: Once | INTRAVENOUS | Status: AC | PRN
  Administered 2020-05-02: 500 [IU]

## 2020-05-02 MED ORDER — SODIUM CHLORIDE 0.9 % IV SOLN: Freq: Once | INTRAVENOUS | Status: AC

## 2020-05-02 MED ORDER — OXYCODONE HCL 10 MG PO TABS: 10.0000 mg | ORAL_TABLET | ORAL | 0 refills | Status: DC | PRN

## 2020-05-02 MED ORDER — SODIUM CHLORIDE 0.9 % IV SOLN
510.0000 mg | Freq: Once | INTRAVENOUS | Status: AC
  Administered 2020-05-02: 510 mg via INTRAVENOUS
  Filled 2020-05-02: qty 510

## 2020-05-02 MED ORDER — SODIUM CHLORIDE 0.9% FLUSH
10.0000 mL | Freq: Once | INTRAVENOUS | Status: AC | PRN
  Administered 2020-05-02: 10 mL

## 2020-05-02 MED ORDER — FAMOTIDINE 20 MG PO TABS
20.0000 mg | ORAL_TABLET | Freq: Once | ORAL | Status: AC
  Administered 2020-05-02: 20 mg via ORAL
  Filled 2020-05-02: qty 1

## 2020-05-02 MED ORDER — LIDOCAINE-PRILOCAINE 2.5-2.5 % EX CREA: TOPICAL_CREAM | CUTANEOUS | 3 refills | Status: DC

## 2020-05-02 MED ORDER — ACETAMINOPHEN 325 MG PO TABS: 650.0000 mg | ORAL_TABLET | Freq: Once | ORAL | Status: DC

## 2020-05-02 MED ORDER — LORATADINE 10 MG PO TABS
10.0000 mg | ORAL_TABLET | Freq: Once | ORAL | Status: AC
  Administered 2020-05-02: 10 mg via ORAL
  Filled 2020-05-02: qty 1

## 2020-05-02 NOTE — Progress Notes (Signed)
Patient took own tylenol from home before today's iron infusion.  Patient tolerated iron infusion with no complaints voiced. Peripheral IV site clean and dry with good blood return noted before and after infusion. Band aid applied. VSS with discharge and left in satisfactory condition with no s/s of distress noted.

## 2020-05-02 NOTE — Patient Instructions (Signed)
[place] at [facility]  Discharge Instructions:  You received your iron infusion today, return as scheduled. _______________________________________________________________  Thank you for choosing [place] at [facility] to provide your oncology and hematology care.  To afford each patient quality time with our providers, please arrive at least 15 minutes before your scheduled appointment.  You need to re-schedule your appointment if you arrive 10 or more minutes late.  We strive to give you quality time with our providers, and arriving late affects you and other patients whose appointments are after yours.  Also, if you no show three or more times for appointments you may be dismissed from the clinic.  Again, thank you for choosing [place] at [place] hope is that these requests will allow you access to exceptional care and in a timely manner. _______________________________________________________________  If you have questions after your visit, please contact our office at [phone] between the hours of 8:30 a.m. and 5:00 p.m. Voicemails left after 4:30 p.m. will not be returned until the following business day. _______________________________________________________________  For prescription refill requests, have your pharmacy contact our office. _______________________________________________________________  Recommendations made by the consultant and any test results will be sent to your referring physician. _______________________________________________________________

## 2020-05-03 ENCOUNTER — Telehealth: Payer: Self-pay | Admitting: Orthopaedic Surgery

## 2020-05-03 ENCOUNTER — Ambulatory Visit (HOSPITAL_COMMUNITY): Payer: Medicaid Other | Admitting: Hematology

## 2020-05-03 NOTE — Telephone Encounter (Signed)
Is she is supposed to get follow-up CBC to check hemoglobin post infusion?

## 2020-05-03 NOTE — Telephone Encounter (Signed)
Patient would like to move forward with left total hip surgery ASAP.  Surgery was scheduled April 11th but was cancelled due to 6.9 hemoglobin.  Patient has been to see Dr. Debe Coder  and had 2 infusions of iron and 1 pint of blood.  She states they did not schedule her for any follow up appointments.  Please advise regarding lab work.  Ok to schedule?  We are holding a date of 06-05-20 for the total hip.      [phone]

## 2020-05-03 NOTE — Telephone Encounter (Signed)
Please advise 

## 2020-05-04 ENCOUNTER — Other Ambulatory Visit (HOSPITAL_COMMUNITY): Payer: Self-pay | Admitting: *Deleted

## 2020-05-08 NOTE — Telephone Encounter (Signed)
Request for surgical clearance sent to Dr. Delton Coombes on 05-08-20.

## 2020-05-09 ENCOUNTER — Inpatient Hospital Stay: Payer: Medicaid Other | Admitting: Orthopaedic Surgery

## 2020-05-11 ENCOUNTER — Encounter: Payer: Self-pay | Admitting: Orthopaedic Surgery

## 2020-05-11 ENCOUNTER — Other Ambulatory Visit (HOSPITAL_COMMUNITY): Payer: Self-pay | Admitting: Hematology

## 2020-05-16 ENCOUNTER — Other Ambulatory Visit (HOSPITAL_COMMUNITY): Payer: Self-pay

## 2020-05-16 DIAGNOSIS — C563 Malignant neoplasm of bilateral ovaries: Secondary | ICD-10-CM

## 2020-05-17 ENCOUNTER — Other Ambulatory Visit: Payer: Self-pay

## 2020-05-17 ENCOUNTER — Inpatient Hospital Stay (HOSPITAL_COMMUNITY): Payer: Medicaid Other | Attending: Hematology

## 2020-05-17 DIAGNOSIS — D649 Anemia, unspecified: Secondary | ICD-10-CM | POA: Insufficient documentation

## 2020-05-17 DIAGNOSIS — Z79899 Other long term (current) drug therapy: Secondary | ICD-10-CM | POA: Diagnosis not present

## 2020-05-17 DIAGNOSIS — Z8543 Personal history of malignant neoplasm of ovary: Secondary | ICD-10-CM | POA: Insufficient documentation

## 2020-05-17 DIAGNOSIS — Z87891 Personal history of nicotine dependence: Secondary | ICD-10-CM | POA: Diagnosis not present

## 2020-05-17 DIAGNOSIS — C563 Malignant neoplasm of bilateral ovaries: Secondary | ICD-10-CM

## 2020-05-17 LAB — COMPREHENSIVE METABOLIC PANEL
ALT: 16 U/L (ref 0–44)
AST: 17 U/L (ref 15–41)
Albumin: 3.6 g/dL (ref 3.5–5.0)
Alkaline Phosphatase: 96 U/L (ref 38–126)
Anion gap: 7 (ref 5–15)
BUN: 11 mg/dL (ref 6–20)
CO2: 29 mmol/L (ref 22–32)
Calcium: 10 mg/dL (ref 8.9–10.3)
Chloride: 102 mmol/L (ref 98–111)
Creatinine, Ser: 0.67 mg/dL (ref 0.44–1.00)
GFR, Estimated: >60 mL/min (ref >60)
Glucose, Bld: 91 mg/dL (ref 70–99)
Potassium: 3.9 mmol/L (ref 3.5–5.1)
Sodium: 138 mmol/L (ref 135–145)
Total Bilirubin: 0.4 mg/dL (ref 0.3–1.2)
Total Protein: 6.9 g/dL (ref 6.5–8.1)

## 2020-05-17 LAB — CBC WITH DIFFERENTIAL/PLATELET
Abs Immature Granulocytes: 0.02 10*3/uL (ref 0.00–0.07)
Basophils Absolute: 0 10*3/uL (ref 0.0–0.1)
Basophils Relative: 1 %
Eosinophils Absolute: 0.1 10*3/uL (ref 0.0–0.5)
Eosinophils Relative: 2 %
HCT: 40.9 % (ref 36.0–46.0)
Hemoglobin: 12.4 g/dL (ref 12.0–15.0)
Immature Granulocytes: 0 %
Lymphocytes Relative: 28 %
Lymphs Abs: 1.7 10*3/uL (ref 0.7–4.0)
MCH: 28.8 pg (ref 26.0–34.0)
MCHC: 30.3 g/dL (ref 30.0–36.0)
MCV: 94.9 fL (ref 80.0–100.0)
Monocytes Absolute: 0.5 10*3/uL (ref 0.1–1.0)
Monocytes Relative: 8 %
Neutro Abs: 3.9 10*3/uL (ref 1.7–7.7)
Neutrophils Relative %: 61 %
Platelets: 370 10*3/uL (ref 150–400)
RBC: 4.31 MIL/uL (ref 3.87–5.11)
RDW: 26.5 % — ABNORMAL HIGH (ref 11.5–15.5)
WBC: 6.3 10*3/uL (ref 4.0–10.5)
nRBC: 0 % (ref 0.0–0.2)

## 2020-05-17 LAB — IRON AND TIBC
Iron: 91 ug/dL (ref 28–170)
Saturation Ratios: 26 % (ref 10.4–31.8)
TIBC: 356 ug/dL (ref 250–450)
UIBC: 265 ug/dL

## 2020-05-17 LAB — FERRITIN: Ferritin: 178 ng/mL (ref 11–307)

## 2020-05-18 ENCOUNTER — Other Ambulatory Visit (HOSPITAL_COMMUNITY): Payer: Self-pay | Admitting: Medical

## 2020-05-19 ENCOUNTER — Other Ambulatory Visit: Payer: Self-pay

## 2020-05-20 ENCOUNTER — Encounter (HOSPITAL_COMMUNITY): Payer: Self-pay

## 2020-05-22 ENCOUNTER — Telehealth (HOSPITAL_COMMUNITY): Payer: Self-pay | Admitting: *Deleted

## 2020-05-22 NOTE — Telephone Encounter (Signed)
Pls advise.  

## 2020-05-29 ENCOUNTER — Other Ambulatory Visit (HOSPITAL_COMMUNITY): Payer: Self-pay

## 2020-05-29 ENCOUNTER — Encounter (HOSPITAL_COMMUNITY): Payer: Self-pay

## 2020-05-29 DIAGNOSIS — C563 Malignant neoplasm of bilateral ovaries: Secondary | ICD-10-CM

## 2020-05-29 MED ORDER — OXYCODONE HCL 10 MG PO TABS: 10.0000 mg | ORAL_TABLET | ORAL | 0 refills | Status: DC | PRN

## 2020-05-30 ENCOUNTER — Other Ambulatory Visit (HOSPITAL_COMMUNITY): Payer: Self-pay

## 2020-05-30 NOTE — Telephone Encounter (Signed)
I will be glad to call him if you need me to

## 2020-05-31 ENCOUNTER — Other Ambulatory Visit: Payer: Self-pay | Admitting: Physician Assistant

## 2020-05-31 ENCOUNTER — Telehealth: Payer: Self-pay | Admitting: Physician Assistant

## 2020-05-31 NOTE — Telephone Encounter (Signed)
Can you call patient and see if she still has all post-op meds that were called in last month when originally scheduled for surgery?

## 2020-05-31 NOTE — Telephone Encounter (Signed)
Ok, thnx

## 2020-05-31 NOTE — Progress Notes (Signed)
Surgical Instructions    Your procedure is scheduled on 06/05/20.  Report to [facility] Main Entrance "A" at 1:00 P.M., then check in with the Admitting office.  Call this number if you have problems the morning of surgery:  [phone]   If you have any questions prior to your surgery date call [phone]: Open Monday-Friday 8am-4pm    Remember:  Do not eat after midnight the night before your surgery  You may drink clear liquids until 12:00 PM the morning of your surgery.   Clear liquids allowed are: Water, Non-Citrus Juices (without pulp), Carbonated Beverages, Clear Tea, Black Coffee Only, and Gatorade  Patient Instructions  . The night before surgery:  o No food after midnight. ONLY clear liquids after midnight  . The day of surgery (if you do NOT have diabetes):  o Drink ONE (1) Pre-Surgery Clear Ensure by 12:00 PM the morning of surgery. Drink in one sitting. Do not sip.  o This drink was given to you during your hospital  pre-op appointment visit. o Nothing else to drink after completing the  Pre-Surgery Clear Ensure.     Take these medicines the morning of surgery with A SIP OF WATER  albuterol (VENTOLIN HFA) if needed for wheezing or shortness of breath ondansetron (ZOFRAN) as needed for nausea and vomiting Oxycodone if needed pantoprazole (PROTONIX)  SUMAtriptan (IMITREX) if needed for migraines   As of today, STOP taking any Aspirin (unless otherwise instructed by your surgeon) Aleve, Naproxen, Ibuprofen, Motrin, Advil, Goody's, BC's, all herbal medications, fish oil, and all vitamins.                     Do not wear jewelry, make up, or nail polish            Do not wear lotions, powders, perfumes, or deodorant.            Do not shave 48 hours prior to surgery.              Do not bring valuables to the hospital.            [facility] is not responsible for any belongings or valuables.  Do NOT Smoke (Tobacco/Vaping) or drink Alcohol 24 hours prior to  your procedure If you use a CPAP at night, you may bring all equipment for your overnight stay.   Contacts, glasses, dentures or bridgework may not be worn into surgery, please bring cases for these belongings   For patients admitted to the hospital, discharge time will be determined by your treatment team.   Patients discharged the day of surgery will not be allowed to drive home, and someone needs to stay with them for 24 hours.    Special instructions:   [place]- Preparing For Surgery  Before surgery, you can play an important role. Because skin is not sterile, your skin needs to be as free of germs as possible. You can reduce the number of germs on your skin by washing with CHG (chlorahexidine gluconate) Soap before surgery.  CHG is an antiseptic cleaner which kills germs and bonds with the skin to continue killing germs even after washing.    Oral Hygiene is also important to reduce your risk of infection.  Remember - BRUSH YOUR TEETH THE MORNING OF SURGERY WITH YOUR REGULAR TOOTHPASTE  Please do not use if you have an allergy to CHG or antibacterial soaps. If your skin becomes reddened/irritated stop using the CHG.  Do not  shave (including legs and underarms) for at least 48 hours prior to first CHG shower. It is OK to shave your face.  Please follow these instructions carefully.   1. Shower the NIGHT BEFORE SURGERY and the MORNING OF SURGERY  2. If you chose to wash your hair, wash your hair first as usual with your normal shampoo.  3. After you shampoo, rinse your hair and body thoroughly to remove the shampoo.  4. Wash Face and genitals (private parts) with your normal soap.   5.  Shower the NIGHT BEFORE SURGERY and the MORNING OF SURGERY with CHG Soap.   6. Use CHG Soap as you would any other liquid soap. You can apply CHG directly to the skin and wash gently with a scrungie or a clean washcloth.   7. Apply the CHG Soap to your body ONLY FROM THE NECK DOWN.  Do not  use on open wounds or open sores. Avoid contact with your eyes, ears, mouth and genitals (private parts). Wash Face and genitals (private parts)  with your normal soap.   8. Wash thoroughly, paying special attention to the area where your surgery will be performed.  9. Thoroughly rinse your body with warm water from the neck down.  10. DO NOT shower/wash with your normal soap after using and rinsing off the CHG Soap.  11. Pat yourself dry with a CLEAN TOWEL.  12. Wear CLEAN PAJAMAS to bed the night before surgery  13. Place CLEAN SHEETS on your bed the night before your surgery  14. DO NOT SLEEP WITH PETS.   Day of Surgery: Take a shower with CHG soap. Wear Clean/Comfortable clothing the morning of surgery Do not apply any deodorants/lotions.   Remember to brush your teeth WITH YOUR REGULAR TOOTHPASTE.   Please read over the following fact sheets that you were given.

## 2020-05-31 NOTE — Telephone Encounter (Signed)
Called patient . She states she does have meds.

## 2020-06-01 ENCOUNTER — Encounter (HOSPITAL_COMMUNITY): Payer: Self-pay

## 2020-06-01 ENCOUNTER — Other Ambulatory Visit: Payer: Self-pay

## 2020-06-01 ENCOUNTER — Encounter (HOSPITAL_COMMUNITY)
Admission: RE | Admit: 2020-06-01 | Discharge: 2020-06-01 | Disposition: A | Discharge status: Home / Self Care | Payer: Medicaid Other | Source: Ambulatory Visit | Attending: Orthopaedic Surgery | Admitting: Orthopaedic Surgery

## 2020-06-01 DIAGNOSIS — Z20822 Contact with and (suspected) exposure to covid-19: Secondary | ICD-10-CM | POA: Diagnosis not present

## 2020-06-01 DIAGNOSIS — Z01818 Encounter for other preprocedural examination: Secondary | ICD-10-CM | POA: Insufficient documentation

## 2020-06-01 HISTORY — DX: Personal history of other diseases of the digestive system: Z87.19

## 2020-06-01 HISTORY — DX: Anemia, unspecified: D64.9

## 2020-06-01 HISTORY — DX: Gastro-esophageal reflux disease without esophagitis: K21.9

## 2020-06-01 LAB — APTT: aPTT: 30 seconds (ref 24–36)

## 2020-06-01 LAB — URINALYSIS, ROUTINE W REFLEX MICROSCOPIC
Bacteria, UA: NONE SEEN
Bilirubin Urine: NEGATIVE
Glucose, UA: NEGATIVE mg/dL
Ketones, ur: NEGATIVE mg/dL
Leukocytes,Ua: NEGATIVE
Nitrite: NEGATIVE
Protein, ur: NEGATIVE mg/dL
Specific Gravity, Urine: 1.005 (ref 1.005–1.030)
pH: 6 (ref 5.0–8.0)

## 2020-06-01 LAB — COMPREHENSIVE METABOLIC PANEL
ALT: 15 U/L (ref 0–44)
AST: 27 U/L (ref 15–41)
Albumin: 3.7 g/dL (ref 3.5–5.0)
Alkaline Phosphatase: 94 U/L (ref 38–126)
Anion gap: 7 (ref 5–15)
BUN: 9 mg/dL (ref 6–20)
CO2: 27 mmol/L (ref 22–32)
Calcium: 9.7 mg/dL (ref 8.9–10.3)
Chloride: 103 mmol/L (ref 98–111)
Creatinine, Ser: 0.66 mg/dL (ref 0.44–1.00)
GFR, Estimated: >60 mL/min (ref >60)
Glucose, Bld: 82 mg/dL (ref 70–99)
Potassium: 3.9 mmol/L (ref 3.5–5.1)
Sodium: 137 mmol/L (ref 135–145)
Total Bilirubin: 0.5 mg/dL (ref 0.3–1.2)
Total Protein: 6.9 g/dL (ref 6.5–8.1)

## 2020-06-01 LAB — CBC WITH DIFFERENTIAL/PLATELET
Abs Immature Granulocytes: 0.02 10*3/uL (ref 0.00–0.07)
Basophils Absolute: 0.1 10*3/uL (ref 0.0–0.1)
Basophils Relative: 1 %
Eosinophils Absolute: 0.2 10*3/uL (ref 0.0–0.5)
Eosinophils Relative: 3 %
HCT: 43.6 % (ref 36.0–46.0)
Hemoglobin: 13.7 g/dL (ref 12.0–15.0)
Immature Granulocytes: 0 %
Lymphocytes Relative: 36 %
Lymphs Abs: 2.7 10*3/uL (ref 0.7–4.0)
MCH: 29.8 pg (ref 26.0–34.0)
MCHC: 31.4 g/dL (ref 30.0–36.0)
MCV: 95 fL (ref 80.0–100.0)
Monocytes Absolute: 0.6 10*3/uL (ref 0.1–1.0)
Monocytes Relative: 8 %
Neutro Abs: 4 10*3/uL (ref 1.7–7.7)
Neutrophils Relative %: 52 %
Platelets: 405 10*3/uL — ABNORMAL HIGH (ref 150–400)
RBC: 4.59 MIL/uL (ref 3.87–5.11)
RDW: 25.1 % — ABNORMAL HIGH (ref 11.5–15.5)
Smear Review: NORMAL
WBC: 7.6 10*3/uL (ref 4.0–10.5)
nRBC: 0 % (ref 0.0–0.2)

## 2020-06-01 LAB — SURGICAL PCR SCREEN
MRSA, PCR: NEGATIVE
Staphylococcus aureus: NEGATIVE

## 2020-06-01 LAB — SARS CORONAVIRUS 2 (TAT 6-24 HRS): SARS Coronavirus 2: NEGATIVE

## 2020-06-01 LAB — PROTIME-INR
INR: 1 (ref 0.8–1.2)
Prothrombin Time: 12.8 seconds (ref 11.4–15.2)

## 2020-06-01 NOTE — Progress Notes (Signed)
PCP - Rennis Petty Vyas Cardiologist - denies Neurologist: Lenor Coffin at [facility] neurologist Oncologist: Derek Jack   PPM/ICD - denies   Chest x-ray - n/a EKG - 06/01/20 Stress Test - 08/17/15 ECHO - 07/24/15 Cardiac Cath - denies  Sleep Study - 02/07/2013 CPAP - no, found to not have sleep apnea   No diabetes  As of today, STOP taking any Aspirin (unless otherwise instructed by your surgeon) Aleve, Naproxen, Ibuprofen, Motrin, Advil, Goody's, BC's, all herbal medications, fish oil, and all vitamins.  ERAS Protcol -yes PRE-SURGERY Ensure or G2- ensure given  COVID TEST- 06/01/20 in PAT   Anesthesia review: no  Patient denies shortness of breath, fever, cough and chest pain at PAT appointment   All instructions explained to the patient, with a verbal understanding of the material. Patient agrees to go over the instructions while at home for a better understanding. Patient also instructed to self quarantine after being tested for COVID-19. The opportunity to ask questions was provided.

## 2020-06-02 MED ORDER — TRANEXAMIC ACID 1000 MG/10ML IV SOLN
2000.0000 mg | INTRAVENOUS | Status: AC
  Administered 2020-06-05: 2000 mg via TOPICAL
  Filled 2020-06-02: qty 20

## 2020-06-02 NOTE — Telephone Encounter (Signed)
Refill request

## 2020-06-04 NOTE — Progress Notes (Signed)
Michelle Holt instructed on surgery change to 1222. Instructed  To arrive at 1022. Instructed clear liquids until 0922 and eras ensure at 0922. Pt stated she had nickel allergy. Dr. Erlinda Hong made aware . Stated not using components with nickel in surgery

## 2020-06-05 ENCOUNTER — Encounter (HOSPITAL_COMMUNITY): Payer: Self-pay | Admitting: Orthopaedic Surgery

## 2020-06-05 ENCOUNTER — Ambulatory Visit (HOSPITAL_COMMUNITY): Payer: Medicaid Other | Admitting: Anesthesiology

## 2020-06-05 ENCOUNTER — Ambulatory Visit (HOSPITAL_COMMUNITY): Payer: Medicaid Other

## 2020-06-05 ENCOUNTER — Observation Stay (HOSPITAL_COMMUNITY): Payer: Medicaid Other

## 2020-06-05 ENCOUNTER — Observation Stay (HOSPITAL_COMMUNITY)
Admission: RE | Admit: 2020-06-05 | Discharge: 2020-06-06 | Disposition: A | Discharge status: Home / Self Care | Payer: Medicaid Other | Attending: Orthopaedic Surgery | Admitting: Orthopaedic Surgery

## 2020-06-05 ENCOUNTER — Other Ambulatory Visit: Payer: Self-pay | Admitting: Physician Assistant

## 2020-06-05 ENCOUNTER — Other Ambulatory Visit: Payer: Self-pay

## 2020-06-05 ENCOUNTER — Encounter (HOSPITAL_COMMUNITY)
Admission: RE | Disposition: A | Discharge status: Home / Self Care | Payer: Self-pay | Source: Home / Self Care | Attending: Orthopaedic Surgery

## 2020-06-05 DIAGNOSIS — Z96642 Presence of left artificial hip joint: Secondary | ICD-10-CM

## 2020-06-05 DIAGNOSIS — Z87891 Personal history of nicotine dependence: Secondary | ICD-10-CM | POA: Diagnosis not present

## 2020-06-05 DIAGNOSIS — Z79899 Other long term (current) drug therapy: Secondary | ICD-10-CM | POA: Insufficient documentation

## 2020-06-05 DIAGNOSIS — M1612 Unilateral primary osteoarthritis, left hip: Principal | ICD-10-CM | POA: Insufficient documentation

## 2020-06-05 DIAGNOSIS — Z96649 Presence of unspecified artificial hip joint: Secondary | ICD-10-CM

## 2020-06-05 DIAGNOSIS — Z419 Encounter for procedure for purposes other than remedying health state, unspecified: Secondary | ICD-10-CM

## 2020-06-05 HISTORY — PX: TOTAL HIP ARTHROPLASTY: SHX124

## 2020-06-05 LAB — TYPE AND SCREEN
ABO/RH(D): O POS
Antibody Screen: NEGATIVE

## 2020-06-05 SURGERY — ARTHROPLASTY, HIP, TOTAL, ANTERIOR APPROACH
Anesthesia: Spinal | Site: Hip | Laterality: Left

## 2020-06-05 MED ORDER — METOCLOPRAMIDE HCL 5 MG PO TABS: 5.0000 mg | ORAL_TABLET | Freq: Three times a day (TID) | ORAL | Status: DC | PRN

## 2020-06-05 MED ORDER — LACTATED RINGERS IV SOLN: INTRAVENOUS | Status: DC

## 2020-06-05 MED ORDER — TRANEXAMIC ACID-NACL 1000-0.7 MG/100ML-% IV SOLN
1000.0000 mg | Freq: Once | INTRAVENOUS | Status: AC
Start: 2020-06-05 — End: 2020-06-05
  Administered 2020-06-05: 1000 mg via INTRAVENOUS
  Filled 2020-06-05: qty 100

## 2020-06-05 MED ORDER — DEXAMETHASONE SODIUM PHOSPHATE 10 MG/ML IJ SOLN
INTRAMUSCULAR | Status: AC
  Filled 2020-06-05: qty 1

## 2020-06-05 MED ORDER — CEFAZOLIN SODIUM-DEXTROSE 2-4 GM/100ML-% IV SOLN
2.0000 g | INTRAVENOUS | Status: AC
  Administered 2020-06-05: 2 g via INTRAVENOUS
  Filled 2020-06-05: qty 100

## 2020-06-05 MED ORDER — ONDANSETRON HCL 4 MG PO TABS: 4.0000 mg | ORAL_TABLET | Freq: Four times a day (QID) | ORAL | Status: DC | PRN

## 2020-06-05 MED ORDER — METHOCARBAMOL 500 MG PO TABS
500.0000 mg | ORAL_TABLET | Freq: Four times a day (QID) | ORAL | Status: DC | PRN
  Administered 2020-06-05 – 2020-06-06 (×3): 500 mg via ORAL
  Filled 2020-06-05 (×2): qty 1

## 2020-06-05 MED ORDER — FENTANYL CITRATE (PF) 100 MCG/2ML IJ SOLN
25.0000 ug | INTRAMUSCULAR | Status: DC | PRN
  Administered 2020-06-05 (×2): 50 ug via INTRAVENOUS

## 2020-06-05 MED ORDER — ACETAMINOPHEN 500 MG PO TABS
1000.0000 mg | ORAL_TABLET | Freq: Four times a day (QID) | ORAL | Status: DC
  Administered 2020-06-05 – 2020-06-06 (×3): 1000 mg via ORAL
  Filled 2020-06-05 (×3): qty 2

## 2020-06-05 MED ORDER — CEFAZOLIN SODIUM-DEXTROSE 2-4 GM/100ML-% IV SOLN
2.0000 g | Freq: Four times a day (QID) | INTRAVENOUS | Status: AC
  Administered 2020-06-05 (×2): 2 g via INTRAVENOUS
  Filled 2020-06-05 (×2): qty 100

## 2020-06-05 MED ORDER — FENTANYL CITRATE (PF) 100 MCG/2ML IJ SOLN
INTRAMUSCULAR | Status: DC | PRN
  Administered 2020-06-05: 100 ug via INTRAVENOUS

## 2020-06-05 MED ORDER — VANCOMYCIN HCL 1000 MG IV SOLR
INTRAVENOUS | Status: AC
  Filled 2020-06-05: qty 1000

## 2020-06-05 MED ORDER — OXYCODONE HCL ER 10 MG PO T12A
10.0000 mg | EXTENDED_RELEASE_TABLET | Freq: Two times a day (BID) | ORAL | Status: DC
  Administered 2020-06-05 – 2020-06-06 (×2): 10 mg via ORAL
  Filled 2020-06-05 (×2): qty 1

## 2020-06-05 MED ORDER — ONDANSETRON HCL 4 MG/2ML IJ SOLN: 4.0000 mg | Freq: Four times a day (QID) | INTRAMUSCULAR | Status: DC | PRN

## 2020-06-05 MED ORDER — ONDANSETRON HCL 4 MG/2ML IJ SOLN
INTRAMUSCULAR | Status: AC
  Filled 2020-06-05: qty 2

## 2020-06-05 MED ORDER — POVIDONE-IODINE 10 % EX SWAB
2.0000 "application " | Freq: Once | CUTANEOUS | Status: AC
  Administered 2020-06-05: 2 via TOPICAL

## 2020-06-05 MED ORDER — BUPIVACAINE IN DEXTROSE 0.75-8.25 % IT SOLN
INTRATHECAL | Status: DC | PRN
  Administered 2020-06-05: 1.8 mL via INTRATHECAL

## 2020-06-05 MED ORDER — ORAL CARE MOUTH RINSE: 15.0000 mL | Freq: Once | OROMUCOSAL | Status: AC

## 2020-06-05 MED ORDER — VENLAFAXINE HCL ER 75 MG PO CP24
150.0000 mg | ORAL_CAPSULE | Freq: Every day | ORAL | Status: DC
  Administered 2020-06-05: 150 mg via ORAL
  Filled 2020-06-05: qty 2

## 2020-06-05 MED ORDER — ACETAMINOPHEN 500 MG PO TABS
1000.0000 mg | ORAL_TABLET | Freq: Once | ORAL | Status: AC
  Administered 2020-06-05: 1000 mg via ORAL
  Filled 2020-06-05: qty 2

## 2020-06-05 MED ORDER — DIPHENHYDRAMINE HCL 12.5 MG/5ML PO ELIX
25.0000 mg | ORAL_SOLUTION | ORAL | Status: DC | PRN
  Filled 2020-06-05: qty 10

## 2020-06-05 MED ORDER — DEXAMETHASONE SODIUM PHOSPHATE 10 MG/ML IJ SOLN: 10.0000 mg | Freq: Once | INTRAMUSCULAR | Status: DC

## 2020-06-05 MED ORDER — OXYCODONE HCL 5 MG PO TABS
10.0000 mg | ORAL_TABLET | ORAL | Status: DC | PRN
  Administered 2020-06-05 – 2020-06-06 (×3): 15 mg via ORAL
  Filled 2020-06-05 (×3): qty 3

## 2020-06-05 MED ORDER — BUPIVACAINE-MELOXICAM ER 400-12 MG/14ML IJ SOLN
INTRAMUSCULAR | Status: AC
  Filled 2020-06-05: qty 1

## 2020-06-05 MED ORDER — MIDAZOLAM HCL 2 MG/2ML IJ SOLN
INTRAMUSCULAR | Status: AC
  Filled 2020-06-05: qty 2

## 2020-06-05 MED ORDER — METHOCARBAMOL 1000 MG/10ML IJ SOLN
500.0000 mg | Freq: Four times a day (QID) | INTRAVENOUS | Status: DC | PRN
  Filled 2020-06-05: qty 5

## 2020-06-05 MED ORDER — IRRISEPT - 450ML BOTTLE WITH 0.05% CHG IN STERILE WATER, USP 99.95% OPTIME
TOPICAL | Status: DC | PRN
  Administered 2020-06-05: 450 mL via TOPICAL

## 2020-06-05 MED ORDER — PROPOFOL 500 MG/50ML IV EMUL
INTRAVENOUS | Status: DC | PRN
  Administered 2020-06-05: 75 ug/kg/min via INTRAVENOUS

## 2020-06-05 MED ORDER — PHENYLEPHRINE HCL-NACL 10-0.9 MG/250ML-% IV SOLN
INTRAVENOUS | Status: DC | PRN
  Administered 2020-06-05: 25 ug/min via INTRAVENOUS

## 2020-06-05 MED ORDER — OXYCODONE HCL 5 MG PO TABS: 5.0000 mg | ORAL_TABLET | ORAL | Status: DC | PRN

## 2020-06-05 MED ORDER — SODIUM CHLORIDE 0.9 % IV SOLN: INTRAVENOUS | Status: DC

## 2020-06-05 MED ORDER — ASPIRIN 81 MG PO CHEW
81.0000 mg | CHEWABLE_TABLET | Freq: Two times a day (BID) | ORAL | Status: DC
  Administered 2020-06-05 – 2020-06-06 (×2): 81 mg via ORAL
  Filled 2020-06-05 (×2): qty 1

## 2020-06-05 MED ORDER — DEXAMETHASONE SODIUM PHOSPHATE 10 MG/ML IJ SOLN
INTRAMUSCULAR | Status: DC | PRN
  Administered 2020-06-05: 10 mg via INTRAVENOUS

## 2020-06-05 MED ORDER — PROPOFOL 10 MG/ML IV BOLUS
INTRAVENOUS | Status: DC | PRN
  Administered 2020-06-05: 30 mg via INTRAVENOUS
  Administered 2020-06-05 (×2): 20 mg via INTRAVENOUS

## 2020-06-05 MED ORDER — METOCLOPRAMIDE HCL 5 MG/ML IJ SOLN: 5.0000 mg | Freq: Three times a day (TID) | INTRAMUSCULAR | Status: DC | PRN

## 2020-06-05 MED ORDER — CHLORHEXIDINE GLUCONATE 0.12 % MT SOLN
15.0000 mL | Freq: Once | OROMUCOSAL | Status: AC
  Administered 2020-06-05: 15 mL via OROMUCOSAL
  Filled 2020-06-05: qty 15

## 2020-06-05 MED ORDER — GABAPENTIN 100 MG PO CAPS: 100.0000 mg | ORAL_CAPSULE | Freq: Every evening | ORAL | Status: DC | PRN

## 2020-06-05 MED ORDER — MENTHOL 3 MG MT LOZG: 1.0000 | LOZENGE | OROMUCOSAL | Status: DC | PRN

## 2020-06-05 MED ORDER — LIDOCAINE 2% (20 MG/ML) 5 ML SYRINGE
INTRAMUSCULAR | Status: DC | PRN
  Administered 2020-06-05: 100 mg via INTRAVENOUS

## 2020-06-05 MED ORDER — SORBITOL 70 % SOLN
30.0000 mL | Freq: Every day | Status: DC | PRN
  Filled 2020-06-05: qty 30

## 2020-06-05 MED ORDER — FENTANYL CITRATE (PF) 250 MCG/5ML IJ SOLN
INTRAMUSCULAR | Status: AC
  Filled 2020-06-05: qty 5

## 2020-06-05 MED ORDER — DOCUSATE SODIUM 100 MG PO CAPS
100.0000 mg | ORAL_CAPSULE | Freq: Two times a day (BID) | ORAL | Status: DC
  Administered 2020-06-05 – 2020-06-06 (×2): 100 mg via ORAL
  Filled 2020-06-05 (×2): qty 1

## 2020-06-05 MED ORDER — SODIUM CHLORIDE 0.9 % IR SOLN
Status: DC | PRN
  Administered 2020-06-05: 1000 mL

## 2020-06-05 MED ORDER — METHOCARBAMOL 500 MG PO TABS
ORAL_TABLET | ORAL | Status: AC
  Filled 2020-06-05: qty 1

## 2020-06-05 MED ORDER — ASPIRIN EC 81 MG PO TBEC: 81.0000 mg | DELAYED_RELEASE_TABLET | Freq: Two times a day (BID) | ORAL | 0 refills | Status: DC

## 2020-06-05 MED ORDER — FENTANYL CITRATE (PF) 100 MCG/2ML IJ SOLN
INTRAMUSCULAR | Status: AC
  Filled 2020-06-05: qty 2

## 2020-06-05 MED ORDER — ALUM & MAG HYDROXIDE-SIMETH 200-200-20 MG/5ML PO SUSP: 30.0000 mL | ORAL | Status: DC | PRN

## 2020-06-05 MED ORDER — POLYETHYLENE GLYCOL 3350 17 G PO PACK
17.0000 g | PACK | Freq: Every day | ORAL | Status: DC
  Administered 2020-06-05 – 2020-06-06 (×2): 17 g via ORAL
  Filled 2020-06-05: qty 1

## 2020-06-05 MED ORDER — MAGNESIUM CITRATE PO SOLN: 1.0000 | Freq: Once | ORAL | Status: DC | PRN

## 2020-06-05 MED ORDER — ONDANSETRON HCL 4 MG/2ML IJ SOLN
INTRAMUSCULAR | Status: DC | PRN
  Administered 2020-06-05: 4 mg via INTRAVENOUS

## 2020-06-05 MED ORDER — ACETAMINOPHEN 325 MG PO TABS: 325.0000 mg | ORAL_TABLET | Freq: Four times a day (QID) | ORAL | Status: DC | PRN

## 2020-06-05 MED ORDER — PHENOL 1.4 % MT LIQD: 1.0000 | OROMUCOSAL | Status: DC | PRN

## 2020-06-05 MED ORDER — BUPIVACAINE-MELOXICAM ER 400-12 MG/14ML IJ SOLN
INTRAMUSCULAR | Status: DC | PRN
  Administered 2020-06-05: 400 mg

## 2020-06-05 MED ORDER — TRANEXAMIC ACID-NACL 1000-0.7 MG/100ML-% IV SOLN
1000.0000 mg | INTRAVENOUS | Status: AC
  Administered 2020-06-05: 1000 mg via INTRAVENOUS
  Filled 2020-06-05: qty 100

## 2020-06-05 MED ORDER — VANCOMYCIN HCL 1 G IV SOLR
INTRAVENOUS | Status: DC | PRN
  Administered 2020-06-05: 1000 mg via TOPICAL

## 2020-06-05 MED ORDER — MIDAZOLAM HCL 2 MG/2ML IJ SOLN
INTRAMUSCULAR | Status: DC | PRN
  Administered 2020-06-05: 2 mg via INTRAVENOUS

## 2020-06-05 MED ORDER — HYDROMORPHONE HCL 1 MG/ML IJ SOLN
0.5000 mg | INTRAMUSCULAR | Status: DC | PRN
Start: 2020-06-05 — End: 2020-06-06
  Administered 2020-06-05: 1 mg via INTRAVENOUS
  Filled 2020-06-05: qty 1

## 2020-06-05 MED ORDER — SUMATRIPTAN SUCCINATE 100 MG PO TABS
100.0000 mg | ORAL_TABLET | ORAL | Status: DC | PRN
  Filled 2020-06-05: qty 1

## 2020-06-05 MED ORDER — 0.9 % SODIUM CHLORIDE (POUR BTL) OPTIME
TOPICAL | Status: DC | PRN
  Administered 2020-06-05: 1000 mL

## 2020-06-05 SURGICAL SUPPLY — 61 items
BAG DECANTER FOR FLEXI CONT (MISCELLANEOUS) ×2 IMPLANT
CELLS DAT CNTRL 66122 CELL SVR (MISCELLANEOUS) IMPLANT
COVER PERINEAL POST (MISCELLANEOUS) ×2 IMPLANT
COVER SURGICAL LIGHT HANDLE (MISCELLANEOUS) ×2 IMPLANT
COVER WAND RF STERILE (DRAPES) ×2 IMPLANT
DRAPE C-ARM 42X72 X-RAY (DRAPES) ×2 IMPLANT
DRAPE POUCH INSTRU U-SHP 10X18 (DRAPES) ×2 IMPLANT
DRAPE STERI IOBAN 125X83 (DRAPES) ×2 IMPLANT
DRAPE U-SHAPE 47X51 STRL (DRAPES) ×4 IMPLANT
DRSG AQUACEL AG ADV 3.5X10 (GAUZE/BANDAGES/DRESSINGS) ×2 IMPLANT
DURAPREP 26ML APPLICATOR (WOUND CARE) ×4 IMPLANT
ELECT BLADE 4.0 EZ CLEAN MEGAD (MISCELLANEOUS) ×2 IMPLANT
ELECT REM PT RETURN 9FT ADLT (ELECTROSURGICAL) ×2 IMPLANT
ELECTRODE BLDE 4.0 EZ CLN MEGD (MISCELLANEOUS) ×1 IMPLANT
ELECTRODE REM PT RTRN 9FT ADLT (ELECTROSURGICAL) ×1 IMPLANT
GLOVE ECLIPSE 7.0 STRL STRAW (GLOVE) ×4 IMPLANT
GLOVE SKINSENSE NS SZ7.5 (GLOVE) ×1
GLOVE SKINSENSE STRL SZ7.5 (GLOVE) ×1 IMPLANT
GLOVE SURG SYN 7.5  E (GLOVE) ×8
GLOVE SURG SYN 7.5 E (GLOVE) ×4 IMPLANT
GLOVE SURG SYN 7.5 PF PI (GLOVE) ×4 IMPLANT
GLOVE SURG UNDER POLY LF SZ7 (GLOVE) ×2 IMPLANT
GOWN STRL REIN XL XLG (GOWN DISPOSABLE) ×2 IMPLANT
GOWN STRL REUS W/ TWL LRG LVL3 (GOWN DISPOSABLE) IMPLANT
GOWN STRL REUS W/ TWL XL LVL3 (GOWN DISPOSABLE) ×1 IMPLANT
GOWN STRL REUS W/TWL LRG LVL3 (GOWN DISPOSABLE)
GOWN STRL REUS W/TWL XL LVL3 (GOWN DISPOSABLE) ×2
HANDPIECE INTERPULSE COAX TIP (DISPOSABLE) ×2
HEAD CERAMIC 36 PLUS 8.5 12 14 (Hips) ×1 IMPLANT
HOOD PEEL AWAY FLYTE STAYCOOL (MISCELLANEOUS) ×4 IMPLANT
IV NS IRRIG 3000ML ARTHROMATIC (IV SOLUTION) ×2 IMPLANT
JET LAVAGE IRRISEPT WOUND (IRRIGATION / IRRIGATOR) ×2 IMPLANT
KIT BASIN OR (CUSTOM PROCEDURE TRAY) ×2 IMPLANT
LAVAGE JET IRRISEPT WOUND (IRRIGATION / IRRIGATOR) ×1 IMPLANT
LINER NEUTRAL 52X36MM PLUS 4 (Liner) ×1 IMPLANT
MARKER SKIN DUAL TIP RULER LAB (MISCELLANEOUS) ×2 IMPLANT
NDL SPNL 18GX3.5 QUINCKE PK (NEEDLE) ×1 IMPLANT
NEEDLE SPNL 18GX3.5 QUINCKE PK (NEEDLE) ×2 IMPLANT
PACK TOTAL JOINT (CUSTOM PROCEDURE TRAY) ×2 IMPLANT
PACK UNIVERSAL I (CUSTOM PROCEDURE TRAY) ×2 IMPLANT
PIN SECTOR W/GRIP ACE CUP 52MM (Hips) ×1 IMPLANT
RETRACTOR WND ALEXIS 18 MED (MISCELLANEOUS) IMPLANT
RTRCTR WOUND ALEXIS 18CM MED (MISCELLANEOUS) IMPLANT
SAW OSC TIP CART 19.5X105X1.3 (SAW) ×2 IMPLANT
SCREW 6.5MMX25MM (Screw) ×1 IMPLANT
SET HNDPC FAN SPRY TIP SCT (DISPOSABLE) ×1 IMPLANT
STAPLER VISISTAT 35W (STAPLE) IMPLANT
STEM FEM SZ3 STD ACTIS (Stem) ×1 IMPLANT
SUT ETHIBOND 2 V 37 (SUTURE) ×2 IMPLANT
SUT VIC AB 0 CT1 27 (SUTURE) ×2
SUT VIC AB 0 CT1 27XBRD ANBCTR (SUTURE) ×1 IMPLANT
SUT VIC AB 1 CTX 36 (SUTURE) ×2
SUT VIC AB 1 CTX36XBRD ANBCTR (SUTURE) ×1 IMPLANT
SUT VIC AB 2-0 CT1 27 (SUTURE) ×4
SUT VIC AB 2-0 CT1 TAPERPNT 27 (SUTURE) ×2 IMPLANT
SYR 50ML LL SCALE MARK (SYRINGE) ×2 IMPLANT
TOWEL GREEN STERILE (TOWEL DISPOSABLE) ×2 IMPLANT
TRAY CATH 16FR W/PLASTIC CATH (SET/KITS/TRAYS/PACK) IMPLANT
TRAY FOLEY W/BAG SLVR 16FR (SET/KITS/TRAYS/PACK) ×2
TRAY FOLEY W/BAG SLVR 16FR ST (SET/KITS/TRAYS/PACK) ×1 IMPLANT
YANKAUER SUCT BULB TIP NO VENT (SUCTIONS) ×2 IMPLANT

## 2020-06-05 NOTE — Transfer of Care (Signed)
Immediate Anesthesia Transfer of Care Note  Patient: ALFRETTA PINCH  Procedure(s) Performed: LEFT TOTAL HIP ARTHROPLASTY ANTERIOR APPROACH (Left Hip)  Patient Location: PACU  Anesthesia Type:Spinal  Level of Consciousness: awake, alert  and oriented  Airway & Oxygen Therapy: Patient Spontanous Breathing and Patient connected to face mask oxygen  Post-op Assessment: Report given to RN and Post -op Vital signs reviewed and stable  Post vital signs: Reviewed and stable  Last Vitals:  Vitals Value Taken Time  BP 118/68 06/05/20 1445  Temp 36.1 C 06/05/20 1430  Pulse 67 06/05/20 1451  Resp 13 06/05/20 1451  SpO2 94 % 06/05/20 1451  Vitals shown include unvalidated device data.  Last Pain:  Vitals:   06/05/20 1430  TempSrc:   PainSc: 0-No pain      Patients Stated Pain Goal: 4 (40/97/35 3299)  Complications: No complications documented.

## 2020-06-05 NOTE — Discharge Instructions (Signed)
INSTRUCTIONS AFTER JOINT REPLACEMENT   o Remove items at home which could result in a fall. This includes throw rugs or furniture in walking pathways o ICE to the affected joint every three hours while awake for 30 minutes at a time, for at least the first 3-5 days, and then as needed for pain and swelling.  Continue to use ice for pain and swelling. You may notice swelling that will progress down to the foot and ankle.  This is normal after surgery.  Elevate your leg when you are not up walking on it.   o Continue to use the breathing machine you got in the hospital (incentive spirometer) which will help keep your temperature down.  It is common for your temperature to cycle up and down following surgery, especially at night when you are not up moving around and exerting yourself.  The breathing machine keeps your lungs expanded and your temperature down.   DIET:  As you were doing prior to hospitalization, we recommend a well-balanced diet.  DRESSING / WOUND CARE / SHOWERING  Keep the surgical dressing until follow up.  The dressing is water proof, so you can shower without any extra covering.  IF THE DRESSING FALLS OFF or the wound gets wet inside, change the dressing with sterile gauze.  Please use good hand washing techniques before changing the dressing.  Do not use any lotions or creams on the incision until instructed by your surgeon.    ACTIVITY  o Increase activity slowly as tolerated, but follow the weight bearing instructions below.   o No driving for 6 weeks or until further direction given by your physician.  You cannot drive while taking narcotics.  o No lifting or carrying greater than 10 lbs. until further directed by your surgeon. o Avoid periods of inactivity such as sitting longer than an hour when not asleep. This helps prevent blood clots.  o You may return to work once you are authorized by your doctor.     WEIGHT BEARING   Weight bearing as tolerated with assist  device (walker, cane, etc) as directed, use it as long as suggested by your surgeon or therapist, typically at least 4-6 weeks.   EXERCISES  Results after joint replacement surgery are often greatly improved when you follow the exercise, range of motion and muscle strengthening exercises prescribed by your doctor. Safety measures are also important to protect the joint from further injury. Any time any of these exercises cause you to have increased pain or swelling, decrease what you are doing until you are comfortable again and then slowly increase them. If you have problems or questions, call your caregiver or physical therapist for advice.   Rehabilitation is important following a joint replacement. After just a few days of immobilization, the muscles of the leg can become weakened and shrink (atrophy).  These exercises are designed to build up the tone and strength of the thigh and leg muscles and to improve motion. Often times heat used for twenty to thirty minutes before working out will loosen up your tissues and help with improving the range of motion but do not use heat for the first two weeks following surgery (sometimes heat can increase post-operative swelling).   These exercises can be done on a training (exercise) mat, on the floor, on a table or on a bed. Use whatever works the best and is most comfortable for you.    Use music or television while you are exercising so that   the exercises are a pleasant break in your day. This will make your life better with the exercises acting as a break in your routine that you can look forward to.   Perform all exercises about fifteen times, three times per day or as directed.  You should exercise both the operative leg and the other leg as well.  Exercises include:   . Quad Sets - Tighten up the muscle on the front of the thigh (Quad) and hold for 5-10 seconds.   . Straight Leg Raises - With your knee straight (if you were given a brace, keep it on),  lift the leg to 60 degrees, hold for 3 seconds, and slowly lower the leg.  Perform this exercise against resistance later as your leg gets stronger.  . Leg Slides: Lying on your back, slowly slide your foot toward your buttocks, bending your knee up off the floor (only go as far as is comfortable). Then slowly slide your foot back down until your leg is flat on the floor again.  . Angel Wings: Lying on your back spread your legs to the side as far apart as you can without causing discomfort.  . Hamstring Strength:  Lying on your back, push your heel against the floor with your leg straight by tightening up the muscles of your buttocks.  Repeat, but this time bend your knee to a comfortable angle, and push your heel against the floor.  You may put a pillow under the heel to make it more comfortable if necessary.   A rehabilitation program following joint replacement surgery can speed recovery and prevent re-injury in the future due to weakened muscles. Contact your doctor or a physical therapist for more information on knee rehabilitation.    CONSTIPATION  Constipation is defined medically as fewer than three stools per week and severe constipation as less than one stool per week.  Even if you have a regular bowel pattern at home, your normal regimen is likely to be disrupted due to multiple reasons following surgery.  Combination of anesthesia, postoperative narcotics, change in appetite and fluid intake all can affect your bowels.   YOU MUST use at least one of the following options; they are listed in order of increasing strength to get the job done.  They are all available over the counter, and you may need to use some, POSSIBLY even all of these options:    Drink plenty of fluids (prune juice may be helpful) and high fiber foods Colace 100 mg by mouth twice a day  Senokot for constipation as directed and as needed Dulcolax (bisacodyl), take with full glass of water  Miralax (polyethylene glycol)  once or twice a day as needed.  If you have tried all these things and are unable to have a bowel movement in the first 3-4 days after surgery call either your surgeon or your primary doctor.    If you experience loose stools or diarrhea, hold the medications until you stool forms back up.  If your symptoms do not get better within 1 week or if they get worse, check with your doctor.  If you experience "the worst abdominal pain ever" or develop nausea or vomiting, please contact the office immediately for further recommendations for treatment.   ITCHING:  If you experience itching with your medications, try taking only a single pain pill, or even half a pain pill at a time.  You can also use Benadryl over the counter for itching or also to   help with sleep.   [name] HOSE STOCKINGS:  Use stockings on both legs until for at least 2 weeks or as directed by physician office. They may be removed at night for sleeping.  MEDICATIONS:  See your medication summary on the "After Visit Summary" that nursing will review with you.  You may have some home medications which will be placed on hold until you complete the course of blood thinner medication.  It is important for you to complete the blood thinner medication as prescribed.  PRECAUTIONS:  If you experience chest pain or shortness of breath - call 911 immediately for transfer to the hospital emergency department.   If you develop a fever greater that 101 F, purulent drainage from wound, increased redness or drainage from wound, foul odor from the wound/dressing, or calf pain - CONTACT YOUR SURGEON.                                                   FOLLOW-UP APPOINTMENTS:  If you do not already have a post-op appointment, please call the office for an appointment to be seen by your surgeon.  Guidelines for how soon to be seen are listed in your "After Visit Summary", but are typically between 1-4 weeks after surgery.  OTHER INSTRUCTIONS:   Knee  Replacement:  Do not place pillow under knee, focus on keeping the knee straight while resting. CPM instructions: 0-90 degrees, 2 hours in the morning, 2 hours in the afternoon, and 2 hours in the evening. Place foam block, curve side up under heel at all times except when in CPM or when walking.  DO NOT modify, tear, cut, or change the foam block in any way.  POST-OPERATIVE OPIOID TAPER INSTRUCTIONS: . It is important to wean off of your opioid medication as soon as possible. If you do not need pain medication after your surgery it is ok to stop day one. . Opioids include: o Codeine, Hydrocodone(Norco, Vicodin), Oxycodone(Percocet, oxycontin) and hydromorphone amongst others.  . Long term and even short term use of opiods can cause: o Increased pain response o Dependence o Constipation o Depression o Respiratory depression o And more.  . Withdrawal symptoms can include o Flu like symptoms o Nausea, vomiting o And more . Techniques to manage these symptoms o Hydrate well o Eat regular healthy meals o Stay active o Use relaxation techniques(deep breathing, meditating, yoga) . Do Not substitute Alcohol to help with tapering . If you have been on opioids for less than two weeks and do not have pain than it is ok to stop all together.  . Plan to wean off of opioids o This plan should start within one week post op of your joint replacement. o Maintain the same interval or time between taking each dose and first decrease the dose.  o Cut the total daily intake of opioids by one tablet each day o Next start to increase the time between doses. o The last dose that should be eliminated is the evening dose.     MAKE SURE YOU:  . Understand these instructions.  . Get help right away if you are not doing well or get worse.    Thank you for letting us be a part of your medical care team.  It is a privilege we respect greatly.  We hope these instructions will   help you stay on track for a fast  and full recovery!       

## 2020-06-05 NOTE — Anesthesia Procedure Notes (Signed)
Spinal  Patient location during procedure: OR Start time: 06/05/2020 12:30 PM End time: 06/05/2020 12:34 PM Reason for block: surgical anesthesia Staffing Performed: anesthesiologist  Anesthesiologist: Freddrick March, MD Preanesthetic Checklist Completed: patient identified, IV checked, risks and benefits discussed, surgical consent, monitors and equipment checked, pre-op evaluation and timeout performed Spinal Block Patient position: sitting Prep: DuraPrep and site prepped and draped Patient monitoring: cardiac monitor, continuous pulse ox and blood pressure Approach: midline Location: L3-4 Injection technique: single-shot Needle Needle type: Pencan  Needle gauge: 24 G Needle length: 9 cm Assessment Sensory level: T6 Events: CSF return Additional Notes Functioning IV was confirmed and monitors were applied. Sterile prep and drape, including hand hygiene and sterile gloves were used. The patient was positioned and the spine was prepped. The skin was anesthetized with lidocaine.  Free flow of clear CSF was obtained prior to injecting local anesthetic into the CSF.  The spinal needle aspirated freely following injection.  The needle was carefully withdrawn.  The patient tolerated the procedure well.

## 2020-06-05 NOTE — H&P (Signed)
PREOPERATIVE H&P  Chief Complaint: left hip degenerative joing disease  HPI: Michelle Holt is a 50 y.o. female who presents for surgical treatment of left hip degenerative joing disease.  She denies any changes in medical history.  Past Medical History:  Diagnosis Date  . Anemia   . Anxiety and depression   . Arthritis of facet joints at multiple vertebral levels    L5-S1  . Constipation   . Dyslipidemia   . Family history of breast cancer   . Family history of uterine cancer   . GERD (gastroesophageal reflux disease)   . History of hiatal hernia   . History of kidney stones   . Insomnia   . Irritable bowel syndrome   . Migraine   . Muscle tension headache   . Neuropathy of finger   . Ovarian carcinoma (HCC)    ovarian  . Plantar fasciitis of right foot   . Port-A-Cath in place 08/20/2018   Past Surgical History:  Procedure Laterality Date  . ABDOMINAL HYSTERECTOMY    . CHOLECYSTECTOMY  2008  . COLONOSCOPY N/A 08/13/2013   Procedure: COLONOSCOPY;  Surgeon: Rogene Houston, MD;  [facility];  Service: Endoscopy;  Laterality: N/A;  230-moved to 145 Ann to notify pt  . ESOPHAGOGASTRODUODENOSCOPY    . ESOPHAGOGASTRODUODENOSCOPY (EGD) WITH PROPOFOL N/A 07/20/2018   Procedure: ESOPHAGOGASTRODUODENOSCOPY (EGD) WITH PROPOFOL;  Surgeon: Rogene Houston, MD;  [facility]: [facility];  Service: Endoscopy;  Laterality: N/A;  Possible esophageal dilation.  . IR ANGIOGRAM SELECTIVE EACH ADDITIONAL VESSEL  08/01/2018  . IR ANGIOGRAM VISCERAL SELECTIVE  08/01/2018  . IR EMBO ART  VEN HEMORR LYMPH EXTRAV  INC GUIDE ROADMAPPING  08/01/2018  . IR IMAGING GUIDED PORT INSERTION  08/20/2018  . IR PERC PLEURAL DRAIN W/INDWELL CATH W/IMG GUIDE  07/08/2018  . IR THORACENTESIS ASP PLEURAL SPACE W/IMG GUIDE  07/07/2018  . IR US GUIDE VASC ACCESS RIGHT  08/01/2018  . PLEURAL EFFUSION DRAINAGE Left 07/13/2018   Procedure: DRAINAGE OF LOCULATED PLEURAL EFFUSION;  Surgeon: Ivin Poot,  MD;  Location: [place];  Service: Thoracic;  Laterality: Left;  . REMOVAL OF PLEURAL DRAINAGE CATHETER Left 08/20/2018   Procedure: REMOVAL OF PLEURAL DRAINAGE CATHETER;  Surgeon: Ivin Poot, MD;  Location: [place];  Service: Thoracic;  Laterality: Left;  . REMOVAL OF PLEURAL DRAINAGE CATHETER Left 08/20/2018   Procedure: REMOVAL OF PLEURAL DRAINAGE CATHETER;  Surgeon: Ivin Poot, MD;  Location: [place];  Service: Thoracic;  Laterality: Left;  . TALC PLEURODESIS Left 07/13/2018   Procedure: Talc Pleuradesis;  Surgeon: Prescott Gum, Collier Salina, MD;  Location: [place];  Service: Thoracic;  Laterality: Left;  . TUBAL LIGATION Bilateral   . UTERINE ABLATION    . VIDEO ASSISTED THORACOSCOPY Left 07/13/2018   Procedure: VIDEO ASSISTED THORACOSCOPY;  Surgeon: Ivin Poot, MD;  Location: [place];  Service: Thoracic;  Laterality: Left;   Social History   Socioeconomic History  . Marital status: Widowed    Spouse name: Not on file  . Number of children: 2  . Years of education: 2-College  . Highest education level: Not on file  Occupational History    Employer: [employer]  Tobacco Use  . Smoking status: Former Smoker    Packs/day: 0.50    Years: 17.00    Pack years: 8.50    Types: Cigarettes    Quit date: 06/22/2018    Years since quitting: 1.9  . Smokeless tobacco: Never  Used  Vaping Use  . Vaping Use: Never used  Substance and Sexual Activity  . Alcohol use: Yes    Alcohol/week: 1.0 standard drink    Types: 1 Glasses of wine per week    Comment: Drinks alcohol on occasion  . Drug use: No  . Sexual activity: Not on file  Other Topics Concern  . Not on file  Social History Narrative   Patient lives at home with her daughter.    Patient has 2 children.    Patient is widowed.    Patient is right handed.    Patient has her Associates degree.      Social Determinants of Health   Financial Resource Strain: Not on file  Food Insecurity: Not on file  Transportation Needs: Not on file   Physical Activity: Not on file  Stress: Not on file  Social Connections: Not on file   Family History  Problem Relation Age of Onset  . Hypertension Mother   . Obesity Mother   . Diabetes Mother   . Kidney disease Mother   . Peripheral vascular disease Father   . Atrial fibrillation Father   . COPD Brother   . Osteoporosis Brother   . Crohn's disease Sister   . Uterine cancer Sister 106       maternal half sister  . Breast cancer Maternal Aunt 53  . Colon cancer Neg Hx    Allergies  Allergen Reactions  . Morphine And Related Itching  . Nortriptyline Other (See Comments)    Significant weight gain  . Topamax [Topiramate] Diarrhea and Nausea Only  . Xanax [Alprazolam]     Can't wake up   . Actifed Cold-Allergy [Chlorpheniramine-Phenyleph Er] Rash  . Amoxicillin Rash    Did it involve swelling of the face/tongue/throat, SOB, or low BP? Unknown Did it involve sudden or severe rash/hives, skin peeling, or any reaction on the inside of your mouth or nose? Unknown Did you need to seek medical attention at a hospital or doctor's office? Unknown When did it last happen? teenager If all above answers are "NO", may proceed with cephalosporin use.   . Codeine Hives  . Erythromycin Rash  . Penicillins Rash    Did it involve swelling of the face/tongue/throat, SOB, or low BP? Unknown Did it involve sudden or severe rash/hives, skin peeling, or any reaction on the inside of your mouth or nose? Unknown Did you need to seek medical attention at a hospital or doctor's office? Unknown When did it last happen? teenager If all above answers are "NO", may proceed with cephalosporin use.   [name] [Pseudoephedrine Hcl] Rash   Prior to Admission medications   Medication Sig Start Date End Date Taking? Authorizing Provider  Ascorbic Acid (VITAMIN C) 1000 MG tablet Take 2,000 mg by mouth daily.   Yes [provider]  B Complex-C (B-COMPLEX WITH VITAMIN C) tablet Take 1  tablet by mouth daily.   Yes [provider]  Cholecalciferol (VITAMIN D) 50 MCG (2000 UT) CAPS Take 2,000 Units by mouth daily.   Yes [provider]  diazepam (VALIUM) 5 MG tablet TAKE ONE TABLET BY MOUTH AT BEDTIME Patient taking differently: Take 5 mg by mouth at bedtime. 05/11/20  Yes Derek Jack, MD  docusate sodium (COLACE) 100 MG capsule Take 100 mg by mouth 2 (two) times daily.   Yes [provider]  gabapentin (NEURONTIN) 100 MG capsule Take 100 mg by mouth at bedtime as needed (pain). 03/08/20  Yes [provider]  lidocaine-prilocaine (EMLA) cream Apply small amount to port a cath site and cover with plastic wrap one hour prior to chemotherapy appointments 05/02/20  Yes Derek Jack, MD  Magnesium 500 MG TABS Take 500 mg by mouth in the morning.   Yes [provider]  naproxen sodium (ALEVE) 220 MG tablet Take 440 mg by mouth in the morning and at bedtime.   Yes [provider]  Oxycodone HCl 10 MG TABS Take 1 tablet (10 mg total) by mouth every 4 (four) hours as needed. 05/29/20  Yes Derek Jack, MD  pantoprazole (PROTONIX) 40 MG tablet TAKE ONE TABLET BY MOUTH TWICE DAILY BEFORE A MEAL 06/02/20  Yes Derek Jack, MD  sennosides-docusate sodium (SENOKOT-S) 8.6-50 MG tablet Take 1 tablet by mouth in the morning and at bedtime.   Yes [provider]  venlafaxine XR (EFFEXOR-XR) 150 MG 24 hr capsule TAKE ONE CAPSULE BY MOUTH DAILY WITH BREAKFAST Patient taking differently: Take 150 mg by mouth at bedtime. 12/13/19  Yes Derek Jack, MD  albuterol (VENTOLIN HFA) 108 (90 Base) MCG/ACT inhaler Inhale 2 puffs into the lungs every 6 (six) hours as needed for wheezing or shortness of breath.    [provider]  docusate sodium (COLACE) 100 MG capsule Take 1 capsule (100 mg total) by mouth daily as needed. To be taken after surgery Patient not taking: No sig reported 04/20/20 04/20/21   Aundra Dubin, PA-C  lidocaine (LIDODERM) 5 % Place 1 patch onto the skin daily. Remove & Discard patch within 12 hours or as directed by MD Patient not taking: Reported on 05/24/2020 03/15/20   Derek Jack, MD  methocarbamol (ROBAXIN) 500 MG tablet Take 1 tablet (500 mg total) by mouth 2 (two) times daily as needed. To be taken after surgery Patient not taking: Reported on 05/24/2020 04/20/20   Aundra Dubin, PA-C  ondansetron (ZOFRAN) 4 MG tablet Take 1 tablet (4 mg total) by mouth every 8 (eight) hours as needed for nausea or vomiting. 04/20/20   Aundra Dubin, PA-C  ondansetron (ZOFRAN-ODT) 8 MG disintegrating tablet TAKE ONE TABLET BY MOUTH EVERY 8 HOURS AS NEEDED FOR NAUSEA AND VOMITING. Patient taking differently: Take 8 mg by mouth every 8 (eight) hours as needed for nausea or vomiting. 11/30/18   Derek Jack, MD  oxyCODONE-acetaminophen (PERCOCET) 7.5-325 MG tablet Take 1-2 tabs every 6 hours prn pain.  To be started as needed after surgery Patient not taking: Reported on 05/24/2020 04/20/20   Aundra Dubin, PA-C  rivaroxaban (XARELTO) 10 MG TABS tablet Take 1 tablet (10 mg total) by mouth daily. To be taken after surgery Patient not taking: Reported on 05/24/2020 04/20/20   Aundra Dubin, PA-C  SUMAtriptan (IMITREX) 100 MG tablet TAKE ONE TABLET BY MOUTH PRN UP to TWICE DAILY AS NEEDED. Patient taking differently: Take 100 mg by mouth every 2 (two) hours as needed for migraine. 08/12/18   Suzzanne Cloud, NP  tiZANidine (ZANAFLEX) 4 MG tablet Take 1 tablet (4 mg total) by mouth 3 (three) times daily as needed for muscle spasms. Patient not taking: Reported on 05/24/2020 03/27/20   Aundra Dubin, PA-C  triamcinolone cream (KENALOG) 0.1 % Apply 1 application topically 2 (two) times daily. Patient taking differently: Apply 1 application topically 2 (two) times daily as needed (rash). 07/06/19   Lockamy, Randi L, NP-C     Positive ROS: All other systems have been reviewed  and were otherwise negative with  the exception of those mentioned in the HPI and as above.  Physical Exam: General: Alert, no acute distress Cardiovascular: No pedal edema Respiratory: No cyanosis, no use of accessory musculature GI: abdomen soft Skin: No lesions in the area of chief complaint Neurologic: Sensation intact distally Psychiatric: Patient is competent for consent with normal mood and affect Lymphatic: no lymphedema  MUSCULOSKELETAL: exam stable  Assessment: left hip degenerative joing disease  Plan: Plan for Procedure(s): LEFT TOTAL HIP ARTHROPLASTY ANTERIOR APPROACH  The risks benefits and alternatives were discussed with the patient including but not limited to the risks of nonoperative treatment, versus surgical intervention including infection, bleeding, nerve injury,  blood clots, cardiopulmonary complications, morbidity, mortality, among others, and they were willing to proceed.   Preoperative templating of the joint replacement has been completed, documented, and submitted to the Operating Room personnel in order to optimize intra-operative equipment management.   Eduard Roux, MD 06/05/2020 11:26 AM

## 2020-06-05 NOTE — Evaluation (Signed)
Physical Therapy Evaluation Patient Details Name: Michelle Holt MRN: [id number] DOB: 1970-02-01 Today's Date: 06/05/2020   History of Present Illness  Pt adm 5/23 for Lt THR direct anterior approach. PMH - ovarian CA, arthritis, anxiety, depression, VATS, splenic laceration  Clinical Impression  Pt doing well s/p lt THR. Pt motivated to return to independence and expect she will make excellent progress. Pt plans to return home with family.     Follow Up Recommendations Follow surgeon's recommendation for DC plan and follow-up therapies;Supervision - Intermittent    Equipment Recommendations  None recommended by PT    Recommendations for Other Services       Precautions / Restrictions Precautions Precautions: None Restrictions Weight Bearing Restrictions: Yes LLE Weight Bearing: Weight bearing as tolerated      Mobility  Bed Mobility Overal bed mobility: Needs Assistance Bed Mobility: Supine to Sit     Supine to sit: Supervision     General bed mobility comments: Pt using UE's to assist bringing LLE off of bed    Transfers Overall transfer level: Needs assistance Equipment used: Rolling walker (2 wheeled) Transfers: Sit to/from Stand Sit to Stand: Supervision         General transfer comment: Assist for safety and lines  Ambulation/Gait Ambulation/Gait assistance: Supervision Gait Distance (Feet): 150 Feet Assistive device: Rolling walker (2 wheeled) Gait Pattern/deviations: Step-through pattern;Decreased stance time - left;Antalgic Gait velocity: decr Gait velocity interpretation: 1.31 - 2.62 ft/sec, indicative of limited community ambulator General Gait Details: Pt with steady gait with walker  Stairs            Wheelchair Mobility    Modified Rankin (Stroke Patients Only)       Balance Overall balance assessment: No apparent balance deficits (not formally assessed)                                           Pertinent  Vitals/Pain Pain Assessment: 0-10 Pain Score: 5  Pain Location: lt hip Pain Descriptors / Indicators: Throbbing Pain Intervention(s): Limited activity within patient's tolerance;Premedicated before session    [place] expects to be discharged to:: Private residence Living Arrangements: Children (Pt is caregiver for son who has TBI. Daughter can care for son as well as be available for pt) Available Help at Discharge: Family;Available 24 hours/day Type of Home: House Home Access: Ramped entrance     Home Layout: One level Home Equipment: Walker - 2 wheels;Walker - 4 wheels      Prior Function Level of Independence: Independent with assistive device(s)         Comments: Has been using walker due to hip pain     Hand Dominance        Extremity/Trunk Assessment   Upper Extremity Assessment Upper Extremity Assessment: Overall WFL for tasks assessed    Lower Extremity Assessment Lower Extremity Assessment: LLE deficits/detail LLE Deficits / Details: Knee/ankle >3/5. Hip limited by pain. Good quad set       Communication   Communication: No difficulties  Cognition Arousal/Alertness: Awake/alert Behavior During Therapy: WFL for tasks assessed/performed Overall Cognitive Status: Within Functional Limits for tasks assessed                                        General Comments  Exercises Total Joint Exercises Ankle Circles/Pumps: AROM;Both;15 reps;Seated Quad Sets: AROM;Left;5 reps;Seated Long Arc Quad: AROM;Both;10 reps;Seated   Assessment/Plan    PT Assessment Patient needs continued PT services  PT Problem Holt Decreased strength;Decreased mobility;Pain       PT Treatment Interventions DME instruction;Gait training;Stair training;Functional mobility training;Therapeutic activities;Therapeutic exercise;Patient/family education    PT Goals (Current goals can be found in the Care Plan section)  Acute Rehab PT  Goals Patient Stated Goal: return home PT Goal Formulation: With patient Time For Goal Achievement: 06/09/20 Potential to Achieve Goals: Good    Frequency 7X/week   Barriers to discharge        Co-evaluation               AM-PAC PT "6 Clicks" Mobility  Outcome Measure Help needed turning from your back to your side while in a flat bed without using bedrails?: A Little Help needed moving from lying on your back to sitting on the side of a flat bed without using bedrails?: A Little Help needed moving to and from a bed to a chair (including a wheelchair)?: A Little Help needed standing up from a chair using your arms (e.g., wheelchair or bedside chair)?: A Little Help needed to walk in hospital room?: A Little Help needed climbing 3-5 steps with a railing? : A Little 6 Click Score: 18    End of Session Equipment Utilized During Treatment: Gait belt Activity Tolerance: Patient tolerated treatment well Patient left: in chair;with call bell/phone within reach Nurse Communication: Mobility status PT Visit Diagnosis: Other abnormalities of gait and mobility (R26.89);Pain Pain - Right/Left: Left Pain - part of body: Hip    Time: 1731-1758 PT Time Calculation (min) (ACUTE ONLY): 27 min   Charges:   PT Evaluation $PT Eval Low Complexity: 1 Low PT Treatments $Gait Training: 8-22 mins        [place] Pager [phone] Office [place] 06/05/2020, 6:08 PM

## 2020-06-05 NOTE — Op Note (Signed)
LEFT TOTAL HIP ARTHROPLASTY ANTERIOR APPROACH  Procedure Note Michelle Holt   [id number]  Pre-op Diagnosis: left hip degenerative joint disease, AVN     Post-op Diagnosis: same   Operative Procedures  1. Total hip replacement; Left hip; uncemented [id number]   Surgeon: Frankey Shown, M.D.  Assist: Madalyn Rob, PA-C   Anesthesia: spinal  Prosthesis: Depuy Acetabulum: Pinnacle 52 mm Femur: Actis 3 STD Head: 36 mm size: +8.5 Liner: +4 Bearing Type: ceramic/poly  Total Hip Arthroplasty (Anterior Approach) Op Note:  After informed consent was obtained and the operative extremity marked in the holding area, the patient was brought back to the operating room and placed supine on the HANA table. Next, the operative extremity was prepped and draped in normal sterile fashion. Surgical timeout occurred verifying patient identification, surgical site, surgical procedure and administration of antibiotics.  A modified anterior Smith-Peterson approach to the hip was performed, using the interval between tensor fascia lata and sartorius.  Dissection was carried bluntly down onto the anterior hip capsule. The lateral femoral circumflex vessels were identified and coagulated. A capsulotomy was performed and the capsular flaps tagged for later repair.  The neck osteotomy was performed. The femoral head was removed which showed erosion of the femoral head consistent with AVN, the acetabular rim was cleared of soft tissue and attention was turned to reaming the acetabulum.  Sequential reaming was performed under fluoroscopic guidance. We reamed to a size 51 mm, and then impacted the acetabular shell. A 25 mm cancellous screw was placed through the shell for added fixation.  The liner was then placed after irrigation and attention turned to the femur.  After placing the femoral hook, the leg was taken to externally rotated, extended and adducted position taking care to perform soft tissue releases to  allow for adequate mobilization of the femur. Soft tissue was cleared from the shoulder of the greater trochanter and the hook elevator used to improve exposure of the proximal femur. Sequential broaching performed up to a size 3. Trial neck and head were placed. The leg was brought back up to neutral and the construct reduced.  Antibiotic irrigation was placed in the surgical wound and kept for at least 1 minute.  The position and sizing of components, offset and leg lengths were checked using fluoroscopy. Stability of the construct was checked in extension and external rotation without any subluxation or impingement of prosthesis. We dislocated the prosthesis, dropped the leg back into position, removed trial components, and irrigated copiously. The final stem and head was then placed, the leg brought back up, the system reduced and fluoroscopy used to verify positioning.  We irrigated, obtained hemostasis and closed the capsule using #2 ethibond suture.  One gram of vancomycin powder was placed in the surgical bed.   One gram of topical tranexamic acid was injected into the joint.  The fascia was closed with #1 vicryl plus, the deep fat layer was closed with 0 vicryl, the subcutaneous layers closed with 2.0 Vicryl Plus and the skin closed with 2.0 nylon and dermabond. A sterile dressing was applied. The patient was awakened in the operating room and taken to recovery in stable condition.  All sponge, needle, and instrument counts were correct at the end of the case.   Tawanna Cooler, my PA, was a medical necessity for opening, closing, limb positioning, retracting, exposing, and overall facilitation and timely completion of the surgery.  Position: supine  Complications: see description of procedure.  Time Out: performed  Drains/Packing: none  Estimated blood loss: see anesthesia record  Returned to Recovery Room: in good condition.   Antibiotics: yes   Mechanical VTE (DVT) Prophylaxis:  sequential compression devices, [name] thigh-high  Chemical VTE (DVT) Prophylaxis: aspirin   Fluid Replacement: see anesthesia record  Specimens Removed: 1 to pathology   Sponge and Instrument Count Correct? yes   PACU: portable radiograph - low AP   Plan/RTC: Return in 2 weeks for staple removal. Weight Bearing/Load Lower Extremity: full  Hip precautions: none Suture Removal: 2 weeks   N. Eduard Roux, MD [facility] 1:53 PM   Implant Name Type Inv. Item Serial No. Manufacturer Lot No. LRB No. Used Action  PIN SECTOR W/GRIP ACE CUP 52MM - [id number] Hips PIN SECTOR W/GRIP ACE CUP 52MM  [employer] [id number] Left 1 Implanted  LINER NEUTRAL 52X36MM PLUS 4 - [id number] Liner LINER NEUTRAL 52X36MM PLUS 4  [employer] [id number] Left 1 Implanted  HEAD CERAMIC 36 PLUS 8.5 12 14  - [id number] Hips HEAD CERAMIC 36 PLUS 8.5 12 14   [employer] [id number] Left 1 Implanted  SCREW 6.5MMX25MM - [id number] Screw SCREW 6.5MMX25MM  [employer] [id number] Left 1 Implanted  STEM FEM SZ3 STD ACTIS - [id number] Stem STEM FEM SZ3 STD ACTIS  [employer] [id number] Left 1 Implanted

## 2020-06-05 NOTE — Anesthesia Preprocedure Evaluation (Addendum)
Anesthesia Evaluation  Patient identified by MRN, date of birth, ID band Patient awake    Reviewed: Allergy & Precautions, NPO status , Patient's Chart, lab work & pertinent test results  Airway Mallampati: I  TM Distance: >3 FB Neck ROM: Full    Dental no notable dental hx. (+) Teeth Intact, Dental Advisory Given   Pulmonary neg pulmonary ROS, former smoker,    Pulmonary exam normal breath sounds clear to auscultation       Cardiovascular Normal cardiovascular exam Rhythm:Regular Rate:Normal  HLD   EKG 04/20/2020: Normal sinus rhythm.  Rate 80.  Stress echo 08/17/2015: - Stress ECG conclusions: The stress ECG was negative for ischemia.  [facility] scoring: exercise time of 7 min; maximum ST deviation of 0  mm; no angina; resulting score is 7. This score predicts a low  risk of cardiac events.  - Staged echo: There was no echocardiographic evidence for  stress-induced ischemia.    Neuro/Psych  Headaches, PSYCHIATRIC DISORDERS Anxiety Depression    GI/Hepatic Neg liver ROS, hiatal hernia, GERD  Controlled and Medicated,  Endo/Other  negative endocrine ROS  Renal/GU negative Renal ROS  negative genitourinary   Musculoskeletal  (+) Arthritis ,   Abdominal   Peds  Hematology negative hematology ROS (+)   Anesthesia Other Findings   Reproductive/Obstetrics                            Anesthesia Physical Anesthesia Plan  ASA: II  Anesthesia Plan: Spinal   Post-op Pain Management:    Induction:   PONV Risk Score and Plan: 2 and Treatment may vary due to age or medical condition, Midazolam, Propofol infusion, Ondansetron and Dexamethasone  Airway Management Planned: Natural Airway  Additional Equipment:   Intra-op Plan:   Post-operative Plan:   Informed Consent: I have reviewed the patients History and Physical, chart, labs and discussed the procedure including the risks, benefits  and alternatives for the proposed anesthesia with the patient or authorized representative who has indicated his/her understanding and acceptance.     Dental advisory given  Plan Discussed with: CRNA  Anesthesia Plan Comments:         Anesthesia Quick Evaluation

## 2020-06-06 ENCOUNTER — Other Ambulatory Visit: Payer: Self-pay | Admitting: Physician Assistant

## 2020-06-06 ENCOUNTER — Encounter: Payer: Self-pay | Admitting: Orthopaedic Surgery

## 2020-06-06 DIAGNOSIS — M1612 Unilateral primary osteoarthritis, left hip: Secondary | ICD-10-CM | POA: Diagnosis not present

## 2020-06-06 LAB — CBC
HCT: 37.2 % (ref 36.0–46.0)
Hemoglobin: 11.7 g/dL — ABNORMAL LOW (ref 12.0–15.0)
MCH: 29.5 pg (ref 26.0–34.0)
MCHC: 31.5 g/dL (ref 30.0–36.0)
MCV: 93.9 fL (ref 80.0–100.0)
Platelets: 387 10*3/uL (ref 150–400)
RBC: 3.96 MIL/uL (ref 3.87–5.11)
RDW: 24 % — ABNORMAL HIGH (ref 11.5–15.5)
WBC: 15.1 10*3/uL — ABNORMAL HIGH (ref 4.0–10.5)
nRBC: 0 % (ref 0.0–0.2)

## 2020-06-06 LAB — BASIC METABOLIC PANEL
Anion gap: 7 (ref 5–15)
BUN: 8 mg/dL (ref 6–20)
CO2: 25 mmol/L (ref 22–32)
Calcium: 9.2 mg/dL (ref 8.9–10.3)
Chloride: 105 mmol/L (ref 98–111)
Creatinine, Ser: 0.64 mg/dL (ref 0.44–1.00)
GFR, Estimated: >60 mL/min (ref >60)
Glucose, Bld: 135 mg/dL — ABNORMAL HIGH (ref 70–99)
Potassium: 3.9 mmol/L (ref 3.5–5.1)
Sodium: 137 mmol/L (ref 135–145)

## 2020-06-06 MED ORDER — TIZANIDINE HCL 4 MG PO TABS: 4.0000 mg | ORAL_TABLET | Freq: Three times a day (TID) | ORAL | 0 refills | Status: DC | PRN

## 2020-06-06 MED ORDER — DEXAMETHASONE 4 MG PO TABS
10.0000 mg | ORAL_TABLET | Freq: Once | ORAL | Status: AC
  Administered 2020-06-06: 10 mg via ORAL
  Filled 2020-06-06: qty 3

## 2020-06-06 NOTE — Progress Notes (Signed)
Patient is discharged from room 3C07 at this time. Alert and in stable condition. IV site d/c'd and instructions read to patient with understanding verbalized and all questions answered. Left unit via wheelchair with all belongings at side. 

## 2020-06-06 NOTE — Anesthesia Postprocedure Evaluation (Signed)
Anesthesia Post Note  Patient: Michelle Holt  Procedure(s) Performed: LEFT TOTAL HIP ARTHROPLASTY ANTERIOR APPROACH (Left Hip)     Patient location during evaluation: PACU Anesthesia Type: Spinal Level of consciousness: oriented and awake and alert Pain management: pain level controlled Vital Signs Assessment: post-procedure vital signs reviewed and stable Respiratory status: spontaneous breathing, respiratory function stable and patient connected to nasal cannula oxygen Cardiovascular status: blood pressure returned to baseline and stable Postop Assessment: no headache, no backache and no apparent nausea or vomiting Anesthetic complications: no   No complications documented.  Last Vitals:  Vitals:   06/06/20 0409 06/06/20 0724  BP: 106/60 (!) 92/58  Pulse: 72 83  Resp: 20 16  Temp: 36.9 C 36.9 C  SpO2: 94% 96%    Last Pain:  Vitals:   06/06/20 0724  TempSrc: Oral  PainSc:                  Loyalty Brashier L Ifrah Vest

## 2020-06-06 NOTE — Discharge Summary (Signed)
Patient ID: Michelle Holt MRN: [id number] DOB/AGE: [birth year] 50 y.o.  Admit date: 06/05/2020 Discharge date: 06/06/2020  Admission Diagnoses:  Principal Problem:   Primary osteoarthritis of left hip Active Problems:   Status post total replacement of left hip   Discharge Diagnoses:  Same  Past Medical History:  Diagnosis Date  . Anemia   . Anxiety and depression   . Arthritis of facet joints at multiple vertebral levels    L5-S1  . Constipation   . Dyslipidemia   . Family history of breast cancer   . Family history of uterine cancer   . GERD (gastroesophageal reflux disease)   . History of hiatal hernia   . History of kidney stones   . Insomnia   . Irritable bowel syndrome   . Migraine   . Muscle tension headache   . Neuropathy of finger   . Ovarian carcinoma (HCC)    ovarian  . Plantar fasciitis of right foot   . Port-A-Cath in place 08/20/2018    Surgeries: Procedure(s): LEFT TOTAL HIP ARTHROPLASTY ANTERIOR APPROACH on 06/05/2020   Consultants:   Discharged Condition: Improved  Hospital Course: Michelle Holt is an 50 y.o. female who was admitted 06/05/2020 for operative treatment ofPrimary osteoarthritis of left hip. Patient has severe unremitting pain that affects sleep, daily activities, and work/hobbies. After pre-op clearance the patient was taken to the operating room on 06/05/2020 and underwent  Procedure(s): LEFT TOTAL HIP ARTHROPLASTY ANTERIOR APPROACH.    Patient was given perioperative antibiotics:  Anti-infectives (From admission, onward)   Start     Dose/Rate Route Frequency Ordered Stop   06/05/20 1830  ceFAZolin (ANCEF) IVPB 2g/100 mL premix        2 g 200 mL/hr over 30 Minutes Intravenous Every 6 hours 06/05/20 1607 06/06/20 0740   06/05/20 1305  vancomycin (VANCOCIN) powder  Status:  Discontinued          As needed 06/05/20 1305 06/05/20 1424   06/05/20 1030  ceFAZolin (ANCEF) IVPB 2g/100 mL premix        2 g 200 mL/hr over 30 Minutes  Intravenous On call to O.R. 06/05/20 1020 06/05/20 1315       Patient was given sequential compression devices, early ambulation, and chemoprophylaxis to prevent DVT.  Patient benefited maximally from hospital stay and there were no complications.    Recent vital signs:  Patient Vitals for the past 24 hrs:  BP Temp Temp src Pulse Resp SpO2 Height Weight  06/06/20 0724 (!) 92/58 98.4 F (36.9 C) Oral 83 16 96 % -- --  06/06/20 0409 106/60 98.5 F (36.9 C) Oral 72 20 94 % -- --  06/05/20 2300 100/61 98.7 F (37.1 C) Oral 72 20 96 % -- --  06/05/20 1948 (!) 114/56 98.1 F (36.7 C) Oral 83 20 95 % -- --  06/05/20 1613 116/76 98.1 F (36.7 C) Oral 69 20 98 % -- --  06/05/20 1613 116/76 98.1 F (36.7 C) Oral 68 20 98 % -- --  06/05/20 1600 104/61 98 F (36.7 C) -- 69 14 94 % -- --  06/05/20 1530 109/62 98 F (36.7 C) -- 68 13 95 % -- --  06/05/20 1515 113/74 -- -- 73 14 98 % -- --  06/05/20 1500 97/85 -- -- 71 15 96 % -- --  06/05/20 1445 118/68 -- -- 70 16 100 % -- --  06/05/20 1430 (!) 107/95 (!) 97 F (36.1 C) -- 67 15 100 % -- --  06/05/20 1013 (!) 108/59 97.9 F (36.6 C) Oral 83 17 96 % 5\' 6"  (1.676 m) 81.6 kg     Recent laboratory studies:  Recent Labs    06/06/20 0503  WBC 15.1*  HGB 11.7*  HCT 37.2  PLT 387  NA 137  K 3.9  CL 105  CO2 25  BUN 8  CREATININE 0.64  GLUCOSE 135*  CALCIUM 9.2     Discharge Medications:   Allergies as of 06/06/2020      Reactions   Morphine And Related Itching   Nickel Itching   Nortriptyline Other (See Comments)   Significant weight gain   Topamax [topiramate] Diarrhea, Nausea Only   Xanax [alprazolam]    Can't wake up    Actifed Cold-allergy [chlorpheniramine-phenyleph Er] Rash   Amoxicillin Rash   Did it involve swelling of the face/tongue/throat, SOB, or low BP? Unknown Did it involve sudden or severe rash/hives, skin peeling, or any reaction on the inside of your mouth or nose? Unknown Did you need to seek medical  attention at a hospital or doctor's office? Unknown When did it last happen? teenager If all above answers are "NO", may proceed with cephalosporin use.   Codeine Hives   Erythromycin Rash   Penicillins Rash   Did it involve swelling of the face/tongue/throat, SOB, or low BP? Unknown Did it involve sudden or severe rash/hives, skin peeling, or any reaction on the inside of your mouth or nose? Unknown Did you need to seek medical attention at a hospital or doctor's office? Unknown When did it last happen? teenager If all above answers are "NO", may proceed with cephalosporin use.   Sudafed [pseudoephedrine Hcl] Rash      Medication List    STOP taking these medications   naproxen sodium 220 MG tablet Commonly known as: ALEVE   ondansetron 8 MG disintegrating tablet Commonly known as: ZOFRAN-ODT   Oxycodone HCl 10 MG Tabs     TAKE these medications   albuterol 108 (90 Base) MCG/ACT inhaler Commonly known as: VENTOLIN HFA Inhale 2 puffs into the lungs every 6 (six) hours as needed for wheezing or shortness of breath.   aspirin EC 81 MG tablet Take 1 tablet (81 mg total) by mouth 2 (two) times daily.   B-complex with vitamin C tablet Take 1 tablet by mouth daily.   diazepam 5 MG tablet Commonly known as: VALIUM TAKE ONE TABLET BY MOUTH AT BEDTIME   docusate sodium 100 MG capsule Commonly known as: Colace Take 1 capsule (100 mg total) by mouth daily as needed. To be taken after surgery What changed: Another medication with the same name was removed. Continue taking this medication, and follow the directions you see here.   gabapentin 100 MG capsule Commonly known as: NEURONTIN Take 100 mg by mouth at bedtime as needed (pain).   lidocaine 5 % Commonly known as: LIDODERM Place 1 patch onto the skin daily. Remove & Discard patch within 12 hours or as directed by MD   lidocaine-prilocaine cream Commonly known as: EMLA Apply small amount to port a cath site  and cover with plastic wrap one hour prior to chemotherapy appointments   Magnesium 500 MG Tabs Take 500 mg by mouth in the morning.   ondansetron 4 MG tablet Commonly known as: Zofran Take 1 tablet (4 mg total) by mouth every 8 (eight) hours as needed for nausea or vomiting.   oxyCODONE-acetaminophen 7.5-325 MG tablet Commonly known as: Percocet Take 1-2 tabs every 6  hours prn pain.  To be started as needed after surgery   pantoprazole 40 MG tablet Commonly known as: PROTONIX TAKE ONE TABLET BY MOUTH TWICE DAILY BEFORE A MEAL   sennosides-docusate sodium 8.6-50 MG tablet Commonly known as: SENOKOT-S Take 1 tablet by mouth in the morning and at bedtime.   SUMAtriptan 100 MG tablet Commonly known as: IMITREX TAKE ONE TABLET BY MOUTH PRN UP to TWICE DAILY AS NEEDED. What changed:   how much to take  how to take this  when to take this  reasons to take this  additional instructions   tiZANidine 4 MG tablet Commonly known as: Zanaflex Take 1 tablet (4 mg total) by mouth 3 (three) times daily as needed for muscle spasms.   triamcinolone cream 0.1 % Commonly known as: KENALOG Apply 1 application topically 2 (two) times daily. What changed:   when to take this  reasons to take this   venlafaxine XR 150 MG 24 hr capsule Commonly known as: EFFEXOR-XR TAKE ONE CAPSULE BY MOUTH DAILY WITH BREAKFAST What changed:   how much to take  how to take this  when to take this  additional instructions   vitamin C 1000 MG tablet Take 2,000 mg by mouth daily.   Vitamin D 50 MCG (2000 UT) Caps Take 2,000 Units by mouth daily.            Durable Medical Equipment  (From admission, onward)         Start     Ordered   06/05/20 1608  DME Walker rolling  Once       Question:  Patient needs a walker to treat with the following condition  Answer:  History of hip replacement   06/05/20 1607   06/05/20 1608  DME 3 n 1  Once        06/05/20 1607   06/05/20 1608  DME  Bedside commode  Once       Question:  Patient needs a bedside commode to treat with the following condition  Answer:  History of hip replacement   06/05/20 1607          Diagnostic Studies: DG Pelvis Portable  Result Date: 06/05/2020 CLINICAL DATA:  Status post left hip replacement. EXAM: PORTABLE PELVIS 1-2 VIEWS COMPARISON:  Radiograph 03/08/2020 FINDINGS: Left hip arthroplasty in expected alignment. No periprosthetic lucency or fracture. Left lateral acetabular fracture fragment was present on preoperative exam. Recent postsurgical change includes air and edema in the soft tissues. IMPRESSION: Left hip arthroplasty without immediate postoperative complication. Electronically Signed   By: Keith Rake M.D.   On: 06/05/2020 15:01   DG C-Arm 1-60 Min  Result Date: 06/05/2020 CLINICAL DATA:  Surgery, elective. Additional history provided: Left anterior hip. EXAM: OPERATIVE left HIP (WITH PELVIS IF PERFORMED) 6 VIEWS TECHNIQUE: Fluoroscopic spot image(s) were submitted for interpretation post-operatively. COMPARISON:  Radiographs of the left hip 03/08/2020. FINDINGS: Six intraoperative fluoroscopic images of the left hip are submitted. On the provided images, there are findings of interval left total hip arthroplasty. The femoral and acetabular components appear well seated. IMPRESSION: Six intraoperative fluoroscopic images of the left hip from left total hip arthroplasty, as described. Electronically Signed   By: Kellie Simmering DO   On: 06/05/2020 14:05   DG HIP OPERATIVE UNILAT WITH PELVIS LEFT  Result Date: 06/05/2020 CLINICAL DATA:  Surgery, elective. Additional history provided: Left anterior hip. EXAM: OPERATIVE left HIP (WITH PELVIS IF PERFORMED) 6 VIEWS TECHNIQUE: Fluoroscopic spot image(s) were  submitted for interpretation post-operatively. COMPARISON:  Radiographs of the left hip 03/08/2020. FINDINGS: Six intraoperative fluoroscopic images of the left hip are submitted. On the provided  images, there are findings of interval left total hip arthroplasty. The femoral and acetabular components appear well seated. IMPRESSION: Six intraoperative fluoroscopic images of the left hip from left total hip arthroplasty, as described. Electronically Signed   By: Kellie Simmering DO   On: 06/05/2020 14:05    Disposition: Discharge disposition: 01-Home or Self Care          Follow-up Information    Leandrew Koyanagi, MD. Schedule an appointment as soon as possible for a visit in 2 weeks.   Specialty: Orthopedic Surgery Contact information: [address] [place] [address] [phone]                Signed: Aundra Dubin 06/06/2020, 7:55 AM

## 2020-06-06 NOTE — Progress Notes (Signed)
Subjective: 1 Day Post-Op Procedure(s) (LRB): LEFT TOTAL HIP ARTHROPLASTY ANTERIOR APPROACH (Left) Patient reports pain as mild.    Objective: Vital signs in last 24 hours: Temp:  [97 F (36.1 C)-98.7 F (37.1 C)] 98.4 F (36.9 C) (05/24 0724) Pulse Rate:  [67-83] 83 (05/24 0724) Resp:  [13-20] 16 (05/24 0724) BP: (92-118)/(56-95) 92/58 (05/24 0724) SpO2:  [94 %-100 %] 96 % (05/24 0724) Weight:  [81.6 kg] 81.6 kg (05/23 1013)  Intake/Output from previous day: 05/23 0701 - 05/24 0700 In: 2060 [P.O.:460; I.V.:1200; IV Piggyback:400] Out: 1150 [Urine:900; Blood:250] Intake/Output this shift: No intake/output data recorded.  Recent Labs    06/06/20 0503  HGB 11.7*   Recent Labs    06/06/20 0503  WBC 15.1*  RBC 3.96  HCT 37.2  PLT 387   Recent Labs    06/06/20 0503  NA 137  K 3.9  CL 105  CO2 25  BUN 8  CREATININE 0.64  GLUCOSE 135*  CALCIUM 9.2   No results for input(s): LABPT, INR in the last 72 hours.  Neurologically intact Neurovascular intact Sensation intact distally Intact pulses distally Dorsiflexion/Plantar flexion intact Incision: dressing C/D/I No cellulitis present Compartment soft   Assessment/Plan: 1 Day Post-Op Procedure(s) (LRB): LEFT TOTAL HIP ARTHROPLASTY ANTERIOR APPROACH (Left) Advance diet Up with therapy D/C IV fluids Discharge home with home health after first PT session WBAT LLE ABLA- mild and stable       Michelle Holt 06/06/2020, 7:53 AM

## 2020-06-06 NOTE — Progress Notes (Signed)
Physical Therapy Treatment Patient Details Name: Michelle Holt MRN: [id number] DOB: 08/01/1970 Today's Date: 06/06/2020    History of Present Illness Pt adm 5/23 for Lt THR direct anterior approach. PMH - ovarian CA, arthritis, anxiety, depression, VATS, splenic laceration    PT Comments    Pt is making good progress with mobility, ambulating up to ~200 ft with UE support and supervision this date without LOB. Pt is capable of performing household mobility and entering/exiting her home safely with supervision at this time. Educated pt on THA exercises for d/c home, with pt performing several reps of each to facilitate learning. Educated pt on car transfers. Will continue to follow acutely. Current recommendations remain appropriate.     Follow Up Recommendations  Follow surgeon's recommendation for DC plan and follow-up therapies;Supervision - Intermittent     Equipment Recommendations  None recommended by PT    Recommendations for Other Services       Precautions / Restrictions Precautions Precautions: None Restrictions Weight Bearing Restrictions: Yes LLE Weight Bearing: Weight bearing as tolerated    Mobility  Bed Mobility Overal bed mobility: Needs Assistance Bed Mobility: Supine to Sit;Sit to Supine     Supine to sit: Supervision Sit to supine: Supervision   General bed mobility comments: Pt using UE's to assist bringing L leg off of/on bed. Extra time due to pain.    Transfers Overall transfer level: Needs assistance Equipment used: Rolling walker (2 wheeled) Transfers: Sit to/from Stand Sit to Stand: Supervision         General transfer comment: Supervision for safety, extra time due to pain, no overt LOB.  Ambulation/Gait Ambulation/Gait assistance: Supervision Gait Distance (Feet): 200 Feet Assistive device: Rolling walker (2 wheeled) Gait Pattern/deviations: Step-through pattern;Decreased stance time - left;Antalgic;Decreased dorsiflexion -  left;Trunk flexed Gait velocity: decr Gait velocity interpretation: 1.31 - 2.62 ft/sec, indicative of limited community ambulator General Gait Details: Pt with slight trunk flexion, cuing to correct. Pt with decreased L ankle dorsiflexion and eversion with stance, needing cues to get foot flat with heel-to-toe sequence to discourage possible ankle inversion injury, success noted. Cued pt to decrease L hip internal rotation with gait also. Supervision for safety, no overt LOB.   Stairs             Wheelchair Mobility    Modified Rankin (Stroke Patients Only)       Balance Overall balance assessment: No apparent balance deficits (not formally assessed)                                          Cognition Arousal/Alertness: Awake/alert Behavior During Therapy: WFL for tasks assessed/performed Overall Cognitive Status: Within Functional Limits for tasks assessed                                        Exercises Total Joint Exercises Ankle Circles/Pumps: AROM;Left;Other reps (comment);Supine (3 reps) Quad Sets: AROM;Left;Other reps (comment);Supine (4 reps) Heel Slides: Left;Other reps (comment);Supine (3 reps) Hip ABduction/ADduction: Left;Other reps (comment);Supine;Standing (3 reps in supine and in standing with RW) Long Arc Quad: Seated;Left;Other reps (comment) (3 reps) Knee Flexion: Left;Other reps (comment);Standing (2 reps with RW) Marching in Standing: Left;Other reps (comment);Standing (2 reps with RW) Standing Hip Extension: Left;Other reps (comment);Standing (2 reps with RW)    General Comments  General comments (skin integrity, edema, etc.): Educated pt on car transfers. Educated pt and provided handout on THA exercises.      Pertinent Vitals/Pain Pain Assessment: Faces Faces Pain Scale: Hurts even more Pain Location: lt hip Pain Descriptors / Indicators: Discomfort;Grimacing;Guarding;Operative site guarding Pain  Intervention(s): Limited activity within patient's tolerance;Monitored during session;Repositioned    Home Living                      Prior Function            PT Goals (current goals can now be found in the care plan section) Acute Rehab PT Goals Patient Stated Goal: to improve and go home PT Goal Formulation: With patient Time For Goal Achievement: 06/09/20 Potential to Achieve Goals: Good Progress towards PT goals: Progressing toward goals    Frequency    7X/week      PT Plan Current plan remains appropriate    Co-evaluation              AM-PAC PT "6 Clicks" Mobility   Outcome Measure  Help needed turning from your back to your side while in a flat bed without using bedrails?: A Little Help needed moving from lying on your back to sitting on the side of a flat bed without using bedrails?: A Little Help needed moving to and from a bed to a chair (including a wheelchair)?: A Little Help needed standing up from a chair using your arms (e.g., wheelchair or bedside chair)?: A Little Help needed to walk in hospital room?: A Little Help needed climbing 3-5 steps with a railing? : A Little 6 Click Score: 18    End of Session   Activity Tolerance: Patient tolerated treatment well Patient left: in chair;with call bell/phone within reach Nurse Communication: Mobility status PT Visit Diagnosis: Other abnormalities of gait and mobility (R26.89);Pain;Difficulty in walking, not elsewhere classified (R26.2) Pain - Right/Left: Left Pain - part of body: Hip     Time: 4627-0350 PT Time Calculation (min) (ACUTE ONLY): 23 min  Charges:  $Gait Training: 8-22 mins $Therapeutic Exercise: 8-22 mins                     Moishe Spice, PT, DPT Acute Rehabilitation Services  Pager: [phone] Office: [place] 06/06/2020, 9:10 AM

## 2020-06-08 ENCOUNTER — Encounter (HOSPITAL_COMMUNITY): Payer: Self-pay | Admitting: Orthopaedic Surgery

## 2020-06-14 ENCOUNTER — Other Ambulatory Visit (HOSPITAL_COMMUNITY): Payer: Self-pay | Admitting: Hematology

## 2020-06-14 DIAGNOSIS — R232 Flushing: Secondary | ICD-10-CM

## 2020-06-20 ENCOUNTER — Ambulatory Visit (INDEPENDENT_AMBULATORY_CARE_PROVIDER_SITE_OTHER): Payer: Medicaid Other | Admitting: Orthopaedic Surgery

## 2020-06-20 DIAGNOSIS — Z96642 Presence of left artificial hip joint: Secondary | ICD-10-CM

## 2020-06-20 NOTE — Progress Notes (Signed)
Post-Op Visit Note   Patient: Michelle Holt           Date of Birth: 1970/08/18           MRN: [id number] Visit Date: 06/20/2020 PCP: [name], MD   Assessment & Plan:  Chief Complaint:  Chief Complaint  Patient presents with  . Left Hip - Routine Post Op, Pain   Visit Diagnoses:  1. Status post total replacement of left hip     Plan:   Mateja is 2-week status post left total hip replacement.  Doing well overall.  She only had 1 session of home health PT.  She is currently walking with a cane and she has been doing home exercises.  She has been progressing well.  No real complaints.  Left hip and thigh show mild swelling.  No seroma.  No signs of infection or drainage.  Range of motion is decent without pain.  Sutures removed and Steri-Strips applied.  Wound care instructions reviewed.  Implant card was provided today.  She already has a handicap placard.  Continue aspirin for DVT prophylaxis.  Follow-up in 4 weeks with standing [facility] pelvis x-rays.  Follow-Up Instructions: Return in about 4 weeks (around 07/18/2020).   Orders:  No orders of the defined types were placed in this encounter.  No orders of the defined types were placed in this encounter.   Imaging: No results found.  PMFS History: Patient Active Problem List   Diagnosis Date Noted  . Status post total replacement of left hip 06/05/2020  . Primary osteoarthritis of left hip 02/09/2020  . Pain in right knee 02/09/2020  . Palliative care patient 09/18/2018  . Port-A-Cath in place 08/20/2018  . Genetic testing 08/20/2018  . 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[facility]) 08/17/2018  . Splenic laceration 07/31/2018  . Family history of uterine cancer   . Family history of breast cancer   . Symptomatic anemia 07/18/2018  . Upper GI bleeding 07/18/2018  . Ovarian cancer, bilateral 07/18/2018  . HCAP (healthcare-associated pneumonia) 07/18/2018  . Pleural effusion 07/07/2018  . Migraine  07/06/2018  . Pleural effusion on left 07/06/2018  . Tobacco abuse 07/06/2018  . Generalized anxiety disorder 05/02/2014  . Unspecified constipation 08/10/2013  . Rectal bleeding 08/10/2013  . Lumbosacral spondylosis without myelopathy 10/23/2011  . Intractable migraine without aura 10/23/2011   Past Medical History:  Diagnosis Date  . Anemia   . Anxiety and depression   . Arthritis of facet joints at multiple vertebral levels    L5-S1  . Constipation   . Dyslipidemia   . Family history of breast cancer   . Family history of uterine cancer   . GERD (gastroesophageal reflux disease)   . History of hiatal hernia   . History of kidney stones   . Insomnia   . Irritable bowel syndrome   . Migraine   . Muscle tension headache   . Neuropathy of finger   . Ovarian carcinoma (HCC)    ovarian  . Plantar fasciitis of right foot   . Port-A-Cath in place 08/20/2018    Family History  Problem Relation Age of Onset  . Hypertension Mother   . Obesity Mother   . Diabetes Mother   . Kidney disease Mother   . Peripheral vascular disease Father   . Atrial fibrillation Father   . COPD Brother   . Osteoporosis Brother   . Crohn's disease Sister   . Uterine cancer Sister 74  maternal half sister  . Breast cancer Maternal Aunt 70  . Colon cancer Neg Hx     Past Surgical History:  Procedure Laterality Date  . ABDOMINAL HYSTERECTOMY    . CHOLECYSTECTOMY  2008  . COLONOSCOPY N/A 08/13/2013   Procedure: COLONOSCOPY;  Surgeon: Rogene Houston, MD;  [facility];  Service: Endoscopy;  Laterality: N/A;  230-moved to 145 Ann to notify pt  . ESOPHAGOGASTRODUODENOSCOPY    . ESOPHAGOGASTRODUODENOSCOPY (EGD) WITH PROPOFOL N/A 07/20/2018   Procedure: ESOPHAGOGASTRODUODENOSCOPY (EGD) WITH PROPOFOL;  Surgeon: Rogene Houston, MD;  [facility];  Service: Endoscopy;  Laterality: N/A;  Possible esophageal dilation.  . IR ANGIOGRAM SELECTIVE EACH ADDITIONAL VESSEL  08/01/2018  .  IR ANGIOGRAM VISCERAL SELECTIVE  08/01/2018  . IR EMBO ART  VEN HEMORR LYMPH EXTRAV  INC GUIDE ROADMAPPING  08/01/2018  . IR IMAGING GUIDED PORT INSERTION  08/20/2018  . IR PERC PLEURAL DRAIN W/INDWELL CATH W/IMG GUIDE  07/08/2018  . IR THORACENTESIS ASP PLEURAL SPACE W/IMG GUIDE  07/07/2018  . IR US GUIDE VASC ACCESS RIGHT  08/01/2018  . PLEURAL EFFUSION DRAINAGE Left 07/13/2018   Procedure: DRAINAGE OF LOCULATED PLEURAL EFFUSION;  Surgeon: Ivin Poot, MD;  Location: [place];  Service: Thoracic;  Laterality: Left;  . REMOVAL OF PLEURAL DRAINAGE CATHETER Left 08/20/2018   Procedure: REMOVAL OF PLEURAL DRAINAGE CATHETER;  Surgeon: Ivin Poot, MD;  Location: [facility];  Service: Thoracic;  Laterality: Left;  . REMOVAL OF PLEURAL DRAINAGE CATHETER Left 08/20/2018   Procedure: REMOVAL OF PLEURAL DRAINAGE CATHETER;  Surgeon: Ivin Poot, MD;  Location: [place];  Service: Thoracic;  Laterality: Left;  . TALC PLEURODESIS Left 07/13/2018   Procedure: Talc Pleuradesis;  Surgeon: Prescott Gum, Collier Salina, MD;  Location: [facility];  Service: Thoracic;  Laterality: Left;  . TOTAL HIP ARTHROPLASTY Left 06/05/2020   Procedure: LEFT TOTAL HIP ARTHROPLASTY ANTERIOR APPROACH;  Surgeon: Leandrew Koyanagi, MD;  Location: [place];  Service: Orthopedics;  Laterality: Left;  3-C  . TUBAL LIGATION Bilateral   . UTERINE ABLATION    . VIDEO ASSISTED THORACOSCOPY Left 07/13/2018   Procedure: VIDEO ASSISTED THORACOSCOPY;  Surgeon: Ivin Poot, MD;  Location: [facility];  Service: Thoracic;  Laterality: Left;   Social History   Occupational History    Employer: [employer]  Tobacco Use  . Smoking status: Former Smoker    Packs/day: 0.50    Years: 17.00    Pack years: 8.50    Types: Cigarettes    Quit date: 06/22/2018    Years since quitting: 1.9  . Smokeless tobacco: Never Used  Vaping Use  . Vaping Use: Never used  Substance and Sexual Activity  . Alcohol use: Yes    Alcohol/week: 1.0 standard drink    Types: 1 Glasses of wine per  week    Comment: Drinks alcohol on occasion  . Drug use: No  . Sexual activity: Not on file

## 2020-06-26 ENCOUNTER — Other Ambulatory Visit (HOSPITAL_COMMUNITY): Payer: Self-pay

## 2020-06-26 ENCOUNTER — Encounter: Payer: Self-pay | Admitting: Orthopaedic Surgery

## 2020-06-26 DIAGNOSIS — C563 Malignant neoplasm of bilateral ovaries: Secondary | ICD-10-CM

## 2020-06-26 MED ORDER — OXYCODONE HCL 10 MG PO TABS: 10.0000 mg | ORAL_TABLET | ORAL | 0 refills | Status: DC | PRN

## 2020-06-27 ENCOUNTER — Other Ambulatory Visit (HOSPITAL_COMMUNITY): Payer: Self-pay | Admitting: *Deleted

## 2020-06-28 ENCOUNTER — Other Ambulatory Visit (HOSPITAL_COMMUNITY): Payer: Self-pay | Admitting: *Deleted

## 2020-06-28 MED ORDER — TRIAMCINOLONE ACETONIDE 0.1 % EX CREA: 1.0000 "application " | TOPICAL_CREAM | Freq: Two times a day (BID) | CUTANEOUS | 11 refills | Status: DC | PRN

## 2020-07-03 ENCOUNTER — Encounter: Payer: Self-pay | Admitting: Orthopaedic Surgery

## 2020-07-07 ENCOUNTER — Other Ambulatory Visit: Payer: Self-pay

## 2020-07-07 ENCOUNTER — Inpatient Hospital Stay (HOSPITAL_COMMUNITY): Payer: Medicaid Other | Attending: Hematology

## 2020-07-07 DIAGNOSIS — Z452 Encounter for adjustment and management of vascular access device: Secondary | ICD-10-CM | POA: Diagnosis present

## 2020-07-07 DIAGNOSIS — D649 Anemia, unspecified: Secondary | ICD-10-CM | POA: Diagnosis present

## 2020-07-07 DIAGNOSIS — Z8543 Personal history of malignant neoplasm of ovary: Secondary | ICD-10-CM | POA: Insufficient documentation

## 2020-07-07 DIAGNOSIS — C563 Malignant neoplasm of bilateral ovaries: Secondary | ICD-10-CM

## 2020-07-07 DIAGNOSIS — Z9221 Personal history of antineoplastic chemotherapy: Secondary | ICD-10-CM | POA: Diagnosis not present

## 2020-07-07 LAB — COMPREHENSIVE METABOLIC PANEL
ALT: 20 U/L (ref 0–44)
AST: 28 U/L (ref 15–41)
Albumin: 3.9 g/dL (ref 3.5–5.0)
Alkaline Phosphatase: 130 U/L — ABNORMAL HIGH (ref 38–126)
Anion gap: 8 (ref 5–15)
BUN: 12 mg/dL (ref 6–20)
CO2: 28 mmol/L (ref 22–32)
Calcium: 9.4 mg/dL (ref 8.9–10.3)
Chloride: 103 mmol/L (ref 98–111)
Creatinine, Ser: 0.61 mg/dL (ref 0.44–1.00)
GFR, Estimated: >60 mL/min (ref >60)
Glucose, Bld: 91 mg/dL (ref 70–99)
Potassium: 3.7 mmol/L (ref 3.5–5.1)
Sodium: 139 mmol/L (ref 135–145)
Total Bilirubin: 0.5 mg/dL (ref 0.3–1.2)
Total Protein: 6.9 g/dL (ref 6.5–8.1)

## 2020-07-07 LAB — CBC WITH DIFFERENTIAL/PLATELET
Abs Immature Granulocytes: 0.03 10*3/uL (ref 0.00–0.07)
Basophils Absolute: 0 10*3/uL (ref 0.0–0.1)
Basophils Relative: 1 %
Eosinophils Absolute: 0.5 10*3/uL (ref 0.0–0.5)
Eosinophils Relative: 7 %
HCT: 39.9 % (ref 36.0–46.0)
Hemoglobin: 12.7 g/dL (ref 12.0–15.0)
Immature Granulocytes: 0 %
Lymphocytes Relative: 33 %
Lymphs Abs: 2.4 10*3/uL (ref 0.7–4.0)
MCH: 32.5 pg (ref 26.0–34.0)
MCHC: 31.8 g/dL (ref 30.0–36.0)
MCV: 102 fL — ABNORMAL HIGH (ref 80.0–100.0)
Monocytes Absolute: 0.5 10*3/uL (ref 0.1–1.0)
Monocytes Relative: 6 %
Neutro Abs: 3.7 10*3/uL (ref 1.7–7.7)
Neutrophils Relative %: 53 %
Platelets: 432 10*3/uL — ABNORMAL HIGH (ref 150–400)
RBC: 3.91 MIL/uL (ref 3.87–5.11)
RDW: 19.9 % — ABNORMAL HIGH (ref 11.5–15.5)
WBC: 7.2 10*3/uL (ref 4.0–10.5)
nRBC: 0 % (ref 0.0–0.2)

## 2020-07-07 LAB — MAGNESIUM: Magnesium: 1.8 mg/dL (ref 1.7–2.4)

## 2020-07-07 MED ORDER — SODIUM CHLORIDE 0.9% FLUSH
10.0000 mL | Freq: Once | INTRAVENOUS | Status: AC
  Administered 2020-07-07: 10 mL via INTRAVENOUS

## 2020-07-07 MED ORDER — HEPARIN SOD (PORK) LOCK FLUSH 100 UNIT/ML IV SOLN
500.0000 [IU] | Freq: Once | INTRAVENOUS | Status: AC
  Administered 2020-07-07: 500 [IU] via INTRAVENOUS

## 2020-07-07 NOTE — Progress Notes (Signed)
Patients port flushed without difficulty.  No blood return noted with no bruising or swelling noted at site.  Band aid applied.  Lab work drawn peripherally.  Patient stable during access and blood draw.  VSS with discharge and left in satisfactory condition with no s/s of distress noted.

## 2020-07-07 NOTE — Patient Instructions (Signed)
[facility]  Discharge Instructions: Thank you for choosing [facility] to provide your oncology and hematology care.  If you have a lab appointment with the [facility], please come in thru the Main Entrance and check in at the main information desk.  Wear comfortable clothing and clothing appropriate for easy access to any Portacath or PICC line.   We strive to give you quality time with your provider. You may need to reschedule your appointment if you arrive late (15 or more minutes).  Arriving late affects you and other patients whose appointments are after yours.  Also, if you miss three or more appointments without notifying the office, you may be dismissed from the clinic at the provider's discretion.      For prescription refill requests, have your pharmacy contact our office and allow 72 hours for refills to be completed.    Today you received PORT flush lab draw      To help prevent nausea and vomiting after your treatment, we encourage you to take your nausea medication as directed.  BELOW ARE SYMPTOMS THAT SHOULD BE REPORTED IMMEDIATELY: *FEVER GREATER THAN 100.4 F (38 C) OR HIGHER *CHILLS OR SWEATING *NAUSEA AND VOMITING THAT IS NOT CONTROLLED WITH YOUR NAUSEA MEDICATION *UNUSUAL SHORTNESS OF BREATH *UNUSUAL BRUISING OR BLEEDING *URINARY PROBLEMS (pain or burning when urinating, or frequent urination) *BOWEL PROBLEMS (unusual diarrhea, constipation, pain near the anus) TENDERNESS IN MOUTH AND THROAT WITH OR WITHOUT PRESENCE OF ULCERS (sore throat, sores in mouth, or a toothache) UNUSUAL RASH, SWELLING OR PAIN  UNUSUAL VAGINAL DISCHARGE OR ITCHING   Items with * indicate a potential emergency and should be followed up as soon as possible or go to the Emergency Department if any problems should occur.  Please show the CHEMOTHERAPY ALERT CARD or IMMUNOTHERAPY ALERT CARD at check-in to the Emergency Department and triage nurse.  Should you have  questions after your visit or need to cancel or reschedule your appointment, please contact [facility] [phone]  and follow the prompts.  Office hours are 8:00 a.m. to 4:30 p.m. Monday - Friday. Please note that voicemails left after 4:00 p.m. may not be returned until the following business day.  We are closed weekends and major holidays. You have access to a nurse at all times for urgent questions. Please call the main number to the clinic [phone] and follow the prompts.  For any non-urgent questions, you may also contact your provider using MyChart. We now offer e-Visits for anyone 4 and older to request care online for non-urgent symptoms. For details visit [email or web address].   Also download the MyChart app! Go to the app store, search "MyChart", open the app, select [facility], and log in with your MyChart username and password.  Due to Covid, a mask is required upon entering the hospital/clinic. If you do not have a mask, one will be given to you upon arrival. For doctor visits, patients may have 1 support person aged 60 or older with them. For treatment visits, patients cannot have anyone with them due to current Covid guidelines and our immunocompromised population.

## 2020-07-08 LAB — CA 125: Cancer Antigen (CA) 125: 12.6 U/mL (ref 0.0–38.1)

## 2020-07-10 ENCOUNTER — Ambulatory Visit (HOSPITAL_COMMUNITY)
Admission: RE | Admit: 2020-07-10 | Discharge: 2020-07-10 | Disposition: A | Discharge status: Home / Self Care | Payer: Medicaid Other | Source: Ambulatory Visit | Attending: Hematology | Admitting: Hematology

## 2020-07-10 ENCOUNTER — Other Ambulatory Visit: Payer: Self-pay

## 2020-07-10 DIAGNOSIS — C563 Malignant neoplasm of bilateral ovaries: Secondary | ICD-10-CM | POA: Insufficient documentation

## 2020-07-10 MED ORDER — IOHEXOL 350 MG/ML SOLN: 100.0000 mL | Freq: Once | INTRAVENOUS | Status: DC | PRN

## 2020-07-10 MED ORDER — IOHEXOL 300 MG/ML  SOLN
100.0000 mL | Freq: Once | INTRAMUSCULAR | Status: AC | PRN
  Administered 2020-07-10: 100 mL via INTRAVENOUS

## 2020-07-12 ENCOUNTER — Encounter (HOSPITAL_COMMUNITY): Payer: Self-pay | Admitting: Hematology and Oncology

## 2020-07-12 ENCOUNTER — Inpatient Hospital Stay (HOSPITAL_BASED_OUTPATIENT_CLINIC_OR_DEPARTMENT_OTHER): Payer: Medicaid Other | Admitting: Hematology and Oncology

## 2020-07-12 ENCOUNTER — Other Ambulatory Visit: Payer: Self-pay

## 2020-07-12 DIAGNOSIS — Z452 Encounter for adjustment and management of vascular access device: Secondary | ICD-10-CM | POA: Diagnosis not present

## 2020-07-12 DIAGNOSIS — G8918 Other acute postprocedural pain: Secondary | ICD-10-CM | POA: Diagnosis not present

## 2020-07-12 DIAGNOSIS — D7389 Other diseases of spleen: Secondary | ICD-10-CM

## 2020-07-12 DIAGNOSIS — C563 Malignant neoplasm of bilateral ovaries: Secondary | ICD-10-CM

## 2020-07-12 NOTE — Assessment & Plan Note (Signed)
I have reviewed her blood work and CT imaging with the patient She has no signs of cancer recurrence She will return in 3 months for further follow-up

## 2020-07-12 NOTE — Assessment & Plan Note (Signed)
Her hip pain is likely due to postop hematoma I would defer to her orthopedic surgeon for further evaluation and management

## 2020-07-12 NOTE — Progress Notes (Signed)
[facility] FOLLOW-UP progress notes  Patient Care Team: [name], MD as PCP - General (Internal Medicine) Donetta Potts, RN as Oncology Nurse Navigator (Oncology) Derek Jack, MD as Medical Oncologist (Oncology)  CHIEF COMPLAINTS/PURPOSE OF VISIT:  Ovarian cancer, for further evaluation  HISTORY OF PRESENTING ILLNESS:  Michelle Holt 50 y.o. female was seen because her primary oncologist is not available She had completed chemotherapy last March She returns to review test results She had hip surgery last month.  Initially, she is doing well but then over the last 1 to 2 weeks, have increasing pain and difficulties with walking From the cancer standpoint, she denies abdominal pain, nausea or changes in bowel habits  I reviewed the patient's records extensive and collaborated the history with the patient. Summary of her history is as follows: Oncology History  Ovarian cancer, bilateral  07/07/2018 Pathology Results   PLEURAL FLUID, LEFT (SPECIMEN 1 OF 1, COLLECTED 07/07/18): - MALIGNANT CELLS CONSISTENT WITH ADENOCARCINOMA - SEE COMMENT  Source Pleural Fluid, (Specimen 1 of 1, collected on 07/07/2018) Gross Specimen: Received is/are 1000cc of bloody red fluid with tissue. (TC:tc) Prepared: # Smears: 0 # Concentration Technique Slides (i.e. ThinPrep): 1 # Cell Block: 1 Conventional Additional Studies: Two Hematology slides labeled [id number] Comment The malignant cells are positive for cytokeratin 7, p53, WT-1, Pax-8, Moc31, ER (weak) and EMA but negative for cytokeratin 20, TTF-1, GATA-3, CDX-2 and D2-40. Overall, the phenotype is consistent with a gynecologic primary; clinical correlation recommended.   07/07/2018 Procedure   Successful ultrasound guided left thoracentesis yielding 2.0 L of pleural fluid   07/08/2018 Procedure   1. Technically successful placement of left 14 French pigtail chest drain, placed to Pleur-evac water-seal.   07/08/2018  Procedure   1. Technically successful five [place] double lumen power injectable PICC placement   07/09/2018 Imaging   Ct chest 1. There is a moderate, loculated left hydropneumothorax with a small air component and moderate fluid component. The largest loculated component is located posteriorly. There is a pigtail drainage catheter about the lateral pleural space. There is no obvious etiology, such as obvious mass or pleural disease.   2. There is a small right pleural effusion with associated atelectasis or consolidation and a subpleural consolidation of the superior segment right lower lobe (series 4, image 56), of uncertain significance, possibly infectious or inflammatory   07/10/2018 Imaging   Ct abdomen and pelvis: 1. The bilateral ovaries are enlarged by heterogeneous appearing cystic lesions, measuring 5.3 x 4.2 cm on the right (series 4, image 72) and 4.5 x 3.2 cm on the left (series 4, image 75). Consider dedicated pelvic ultrasound and/or pelvic MRI to further evaluate for solid components given high suspicion for GYN primary malignancy.   2. No other evidence of mass and no lymphadenopathy in the abdomen or pelvis.   3. Trace ascites. There is some suggestion of omental and peritoneal nodularity (e.g. Series 4, image 55), concerning for peritoneal metastatic disease.    4. Loculated left-sided pleural effusion with left-sided pleural drainage catheter in position. Small right pleural effusion   07/13/2018 Surgery   OPERATION: 1.  Left VATS (video-assisted thoracoscopic surgery) for drainage of loculated pleural effusion. 2.  Talc pleurodesis for malignant pleural effusion. 3.  Placement of PleurX catheter for management of malignant pleural effusion. 4.  Placement of On-Q analgesia catheter system.    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   POSTOPERATIVE  DIAGNOSIS:  Large malignant left pleural effusion,  probable adenocarcinoma of the ovary by cytology.   07/13/2018 Pathology Results   Pleura, peel, Left Pleural - FIBRO-FIBRINOUS PLEURITIS - NEGATIVE FOR MALIGNANCY   07/18/2018 Initial Diagnosis   Ovarian cancer, bilateral (HCC)   07/20/2018 Procedure   EGD impression: Normal proximal esophagus and mid esophagus. Mild distal esophageal rings; dilation not performed because of esophagitis. LA Grade C reflux esophagitis. Z-line regular, 30 cm from the incisors. 5 cm hiatal hernia. Non-bleeding gastric ulcer with no stigmata of bleeding. Gastritis. Duodenal erosions without bleeding. Normal second portion of the duodenum. No specimens collected.   08/19/2018 Genetic Testing   Negative genetic testing on the common hereditary cancer panel.  The Common Hereditary Gene Panel offered by [facility] includes sequencing and/or deletion duplication testing of the following 48 genes: APC, ATM, AXIN2, BARD1, BMPR1A, BRCA1, BRCA2, BRIP1, CDH1, CDK4, CDKN2A (p14ARF), CDKN2A (p16INK4a), CHEK2, CTNNA1, DICER1, EPCAM (Deletion/duplication testing only), GREM1 (promoter region deletion/duplication testing only), KIT, MEN1, MLH1, MSH2, MSH3, MSH6, MUTYH, NBN, NF1, NHTL1, PALB2, PDGFRA, PMS2, POLD1, POLE, PTEN, RAD50, RAD51C, RAD51D, RNF43, SDHB, SDHC, SDHD, SMAD4, SMARCA4. STK11, TP53, TSC1, TSC2, and VHL.  The following genes were evaluated for sequence changes only: SDHA and HOXB13 c.251G>A variant only. The report date is August 19, 2018.   08/24/2018 -  Chemotherapy   The patient had palonosetron (ALOXI) injection 0.25 mg, 0.25 mg, Intravenous,  Once, 7 of 7 cycles Administration: 0.25 mg (08/24/2018), 0.25 mg (09/14/2018), 0.25 mg (10/05/2018), 0.25 mg (12/01/2018), 0.25 mg (01/27/2019), 0.25 mg (02/22/2019), 0.25 mg (03/15/2019) CARBOplatin (PARAPLATIN) 740 mg in sodium chloride 0.9 % 250 mL chemo infusion, 740 mg (100 % of original dose 742.8 mg), Intravenous,  Once, 7 of 7 cycles Dose modification:   (original dose  742.8 mg, Cycle 1),   (original dose 736.8 mg, Cycle 2),   (original dose 736.8 mg, Cycle 3),   (original dose 736.8 mg, Cycle 4),   (original dose 736.8 mg, Cycle 5),   (original dose 736.8 mg, Cycle 6),   (original dose 736.8 mg, Cycle 7) Administration: 740 mg (08/24/2018), 740 mg (09/14/2018), 740 mg (10/05/2018), 740 mg (12/01/2018), 740 mg (01/27/2019), 740 mg (02/22/2019), 740 mg (03/15/2019) PACLitaxel (TAXOL) 324 mg in sodium chloride 0.9 % 500 mL chemo infusion (> 80mg/m2), 175 mg/m2 = 324 mg, Intravenous,  Once, 7 of 7 cycles Administration: 324 mg (08/24/2018), 324 mg (09/14/2018), 324 mg (10/05/2018), 324 mg (12/01/2018), 324 mg (01/27/2019), 324 mg (02/22/2019), 324 mg (03/15/2019) fosaprepitant (EMEND) 150 mg, dexamethasone (DECADRON) 12 mg in sodium chloride 0.9 % 145 mL IVPB, , Intravenous,  Once, 7 of 7 cycles Administration:  (08/24/2018),  (09/14/2018),  (10/05/2018),  (12/01/2018),  (01/27/2019),  (02/22/2019),  (03/15/2019)   for chemotherapy treatment.       MEDICAL HISTORY:  Past Medical History:  Diagnosis Date   Anemia    Anxiety and depression    Arthritis of facet joints at multiple vertebral levels    L5-S1   Constipation    Dyslipidemia    Family history of breast cancer    Family history of uterine cancer    GERD (gastroesophageal reflux disease)    History of hiatal hernia    History of kidney stones    Insomnia    Irritable bowel syndrome    Migraine    Muscle tension headache    Neuropathy of finger    Ovarian carcinoma (HCC)    ovarian   Plantar fasciitis of right foot      Port-A-Cath in place 08/20/2018    SURGICAL HISTORY: Past Surgical History:  Procedure Laterality Date   ABDOMINAL HYSTERECTOMY     CHOLECYSTECTOMY  2008   COLONOSCOPY N/A 08/13/2013   Procedure: COLONOSCOPY;  Surgeon: Najeeb U Rehman, MD;  Location: [facility];  Service: Endoscopy;  Laterality: N/A;  230-moved to 145 Ann to notify pt   ESOPHAGOGASTRODUODENOSCOPY     ESOPHAGOGASTRODUODENOSCOPY  (EGD) WITH PROPOFOL N/A 07/20/2018   Procedure: ESOPHAGOGASTRODUODENOSCOPY (EGD) WITH PROPOFOL;  Surgeon: Rehman, Najeeb U, MD;  Location: [facility];  Service: Endoscopy;  Laterality: N/A;  Possible esophageal dilation.   IR ANGIOGRAM SELECTIVE EACH ADDITIONAL VESSEL  08/01/2018   IR ANGIOGRAM VISCERAL SELECTIVE  08/01/2018   IR EMBO ART  VEN HEMORR LYMPH EXTRAV  INC GUIDE ROADMAPPING  08/01/2018   IR IMAGING GUIDED PORT INSERTION  08/20/2018   IR PERC PLEURAL DRAIN W/INDWELL CATH W/IMG GUIDE  07/08/2018   IR THORACENTESIS ASP PLEURAL SPACE W/IMG GUIDE  07/07/2018   IR US GUIDE VASC ACCESS RIGHT  08/01/2018   PLEURAL EFFUSION DRAINAGE Left 07/13/2018   Procedure: DRAINAGE OF LOCULATED PLEURAL EFFUSION;  Surgeon: Van Trigt, Peter, MD;  Location: [facility];  Service: Thoracic;  Laterality: Left;   REMOVAL OF PLEURAL DRAINAGE CATHETER Left 08/20/2018   Procedure: REMOVAL OF PLEURAL DRAINAGE CATHETER;  Surgeon: Van Trigt, Peter, MD;  Location: [facility];  Service: Thoracic;  Laterality: Left;   REMOVAL OF PLEURAL DRAINAGE CATHETER Left 08/20/2018   Procedure: REMOVAL OF PLEURAL DRAINAGE CATHETER;  Surgeon: Van Trigt, Peter, MD;  Location: [facility];  Service: Thoracic;  Laterality: Left;   TALC PLEURODESIS Left 07/13/2018   Procedure: Talc Pleuradesis;  Surgeon: Van Trigt, Peter, MD;  Location: [facility];  Service: Thoracic;  Laterality: Left;   TOTAL HIP ARTHROPLASTY Left 06/05/2020   Procedure: LEFT TOTAL HIP ARTHROPLASTY ANTERIOR APPROACH;  Surgeon: Xu, Naiping M, MD;  Location: [facility];  Service: Orthopedics;  Laterality: Left;  3-C   TUBAL LIGATION Bilateral    UTERINE ABLATION     VIDEO ASSISTED THORACOSCOPY Left 07/13/2018   Procedure: VIDEO ASSISTED THORACOSCOPY;  Surgeon: Van Trigt, Peter, MD;  Location: [facility];  Service: Thoracic;  Laterality: Left;    SOCIAL HISTORY: Social History   Socioeconomic History   Marital status: Widowed    Spouse name: Not on file   Number of children: 2   Years of education:  2-College   Highest education level: Not on file  Occupational History    Employer: [employer]  Tobacco Use   Smoking status: Former    Packs/day: 0.50    Years: 17.00    Pack years: 8.50    Types: Cigarettes    Quit date: 06/22/2018    Years since quitting: 2.0   Smokeless tobacco: Never  Vaping Use   Vaping Use: Never used  Substance and Sexual Activity   Alcohol use: Yes    Alcohol/week: 1.0 standard drink    Types: 1 Glasses of wine per week    Comment: Drinks alcohol on occasion   Drug use: No   Sexual activity: Not on file  Other Topics Concern   Not on file  Social History Narrative   Patient lives at home with her daughter.    Patient has 2 children.    Patient is widowed.    Patient is right handed.    Patient has her Associates degree.      Social Determinants of Health   Financial Resource Strain: Not on   file  Food Insecurity: Not on file  Transportation Needs: Not on file  Physical Activity: Not on file  Stress: Not on file  Social Connections: Not on file  Intimate Partner Violence: Not on file    FAMILY HISTORY: Family History  Problem Relation Age of Onset   Hypertension Mother    Obesity Mother    Diabetes Mother    Kidney disease Mother    Peripheral vascular disease Father    Atrial fibrillation Father    COPD Brother    Osteoporosis Brother    Crohn's disease Sister    Uterine cancer Sister 61       maternal half sister   Breast cancer Maternal Aunt 50   Colon cancer Neg Hx     ALLERGIES:  is allergic to morphine and related, nickel, nortriptyline, topamax [topiramate], xanax [alprazolam], actifed cold-allergy [chlorpheniramine-phenyleph er], amoxicillin, codeine, erythromycin, penicillins, and sudafed [pseudoephedrine hcl].  MEDICATIONS:  Current Outpatient Medications  Medication Sig Dispense Refill   albuterol (VENTOLIN HFA) 108 (90 Base) MCG/ACT inhaler Inhale 2 puffs into the lungs every 6 (six) hours as needed for wheezing or  shortness of breath.     Ascorbic Acid (VITAMIN C) 1000 MG tablet Take 2,000 mg by mouth daily.     aspirin EC 81 MG tablet Take 1 tablet (81 mg total) by mouth 2 (two) times daily. 84 tablet 0   B Complex-C (B-COMPLEX WITH VITAMIN C) tablet Take 1 tablet by mouth daily.     Cholecalciferol (VITAMIN D) 50 MCG (2000 UT) CAPS Take 2,000 Units by mouth daily.     diazepam (VALIUM) 5 MG tablet TAKE ONE TABLET BY MOUTH AT BEDTIME (Patient taking differently: Take 5 mg by mouth at bedtime.) 30 tablet 5   gabapentin (NEURONTIN) 100 MG capsule Take 200 mg by mouth at bedtime as needed (pain).     lidocaine (LIDODERM) 5 % Place 1 patch onto the skin daily. Remove & Discard patch within 12 hours or as directed by MD 30 patch 0   lidocaine-prilocaine (EMLA) cream Apply small amount to port a cath site and cover with plastic wrap one hour prior to chemotherapy appointments 30 g 3   Magnesium 500 MG TABS Take 500 mg by mouth in the morning.     ondansetron (ZOFRAN) 4 MG tablet Take 1 tablet (4 mg total) by mouth every 8 (eight) hours as needed for nausea or vomiting. 40 tablet 0   Oxycodone HCl 10 MG TABS Take 1 tablet (10 mg total) by mouth every 4 (four) hours as needed. 168 tablet 0   oxyCODONE-acetaminophen (PERCOCET) 7.5-325 MG tablet Take 1-2 tabs every 6 hours prn pain.  To be started as needed after surgery (Patient taking differently: Take 1-2 tabs every 6 hours prn pain.  To be started as needed after surgery) 40 tablet 0   pantoprazole (PROTONIX) 40 MG tablet TAKE ONE TABLET BY MOUTH TWICE DAILY BEFORE A MEAL 60 tablet 2   sennosides-docusate sodium (SENOKOT-S) 8.6-50 MG tablet Take 1 tablet by mouth in the morning and at bedtime.     tiZANidine (ZANAFLEX) 4 MG tablet Take 1 tablet (4 mg total) by mouth 3 (three) times daily as needed for muscle spasms. 60 tablet 0   venlafaxine XR (EFFEXOR-XR) 150 MG 24 hr capsule TAKE ONE CAPSULE BY MOUTH DAILY WITH BREAKFAST 30 capsule 6   docusate sodium (COLACE)  100 MG capsule Take 1 capsule (100 mg total) by mouth daily as needed. To be taken   after surgery (Patient not taking: Reported on 07/12/2020) 30 capsule 2   SUMAtriptan (IMITREX) 100 MG tablet TAKE ONE TABLET BY MOUTH PRN UP to TWICE DAILY AS NEEDED. (Patient not taking: Reported on 07/12/2020) 9 tablet 5   triamcinolone cream (KENALOG) 0.1 % Apply 1 application topically 2 (two) times daily as needed (rash). (Patient not taking: Reported on 07/12/2020) 80 g 11   No current facility-administered medications for this visit.    REVIEW OF SYSTEMS:   Constitutional: Denies fevers, chills or abnormal night sweats Eyes: Denies blurriness of vision, double vision or watery eyes Ears, nose, mouth, throat, and face: Denies mucositis or sore throat Respiratory: Denies cough, dyspnea or wheezes Cardiovascular: Denies palpitation, chest discomfort or lower extremity swelling Gastrointestinal:  Denies nausea, heartburn or change in bowel habits Skin: Denies abnormal skin rashes Lymphatics: Denies new lymphadenopathy or easy bruising Neurological:Denies numbness, tingling or new weaknesses Behavioral/Psych: Mood is stable, no new changes  All other systems were reviewed with the patient and are negative.  PHYSICAL EXAMINATION: ECOG PERFORMANCE STATUS: 1 - Symptomatic but completely ambulatory GENERAL:alert, no distress and comfortable PSYCH: alert & oriented x 3 with fluent speech NEURO: no focal motor/sensory deficits  LABORATORY DATA:  I have reviewed the data as listed Lab Results  Component Value Date   WBC 7.2 07/07/2020   HGB 12.7 07/07/2020   HCT 39.9 07/07/2020   MCV 102.0 (H) 07/07/2020   PLT 432 (H) 07/07/2020   Recent Labs    10/07/19 1038 01/26/20 1051 05/17/20 1056 06/01/20 1430 06/06/20 0503 07/07/20 0757  NA 138   < > 138 137 137 139  K 3.9   < > 3.9 3.9 3.9 3.7  CL 104   < > 102 103 105 103  CO2 27   < > 29 27 25 28  GLUCOSE 107*   < > 91 82 135* 91  BUN 16   < > 11 9  8 12  CREATININE 0.56   < > 0.67 0.66 0.64 0.61  CALCIUM 9.4   < > 10.0 9.7 9.2 9.4  GFRNONAA >60   < > >60 >60 >60 >60  GFRAA >60  --   --   --   --   --   PROT 6.8   < > 6.9 6.9  --  6.9  ALBUMIN 3.9   < > 3.6 3.7  --  3.9  AST 17   < > 17 27  --  28  ALT 18   < > 16 15  --  20  ALKPHOS 70   < > 96 94  --  130*  BILITOT 0.6   < > 0.4 0.5  --  0.5   < > = values in this interval not displayed.    RADIOGRAPHIC STUDIES: I have reviewed imaging studies with the patient I have personally reviewed the radiological images as listed and agreed with the findings in the report. CT Abdomen Pelvis W Contrast  Result Date: 07/10/2020 CLINICAL DATA:  Ovarian cancer surveillance, status post total hysterectomy. EXAM: CT ABDOMEN AND PELVIS WITH CONTRAST TECHNIQUE: Multidetector CT imaging of the abdomen and pelvis was performed using the standard protocol following bolus administration of intravenous contrast. CONTRAST:  100mL OMNIPAQUE IOHEXOL 300 MG/ML  SOLN COMPARISON:  Multiple priors including CT October 13, 2019 and hip radiograph Jun 05, 2020 FINDINGS: Lower chest: No acute abnormality.  Moderate hiatal hernia. Hepatobiliary: No suspicious hepatic lesion. Gallbladder surgically absent. Mild prominence of the biliary tree,   likely reservoir effect post cholecystectomy. Pancreas: Within normal limits. Probable splenic artery aneurysm coils in the region the pancreatic tail. Spleen: Decreased size of the fluid density along the superior aspect of the spleen measuring 7.1 cm in maximum axial dimension on image 16/2 previously 7.8 cm. Adrenals/Urinary Tract: Bilateral adrenal glands are unremarkable. No hydronephrosis. Unchanged size of the hypodense 9 mm interpolar renal lesion which is too small to accurately characterize on image 34/2. Right kidneys unremarkable. Urinary bladder is grossly unremarkable for degree of distension. Stomach/Bowel: Moderate hiatal hernia. No pathologic dilation of small bowel.  The appendix and terminal ileum are grossly unremarkable. Radiopaque enteric contrast traverses the hepatic flexure. No suspicious colonic wall thickening or mass like lesions. Vascular/Lymphatic: Aortic atherosclerosis without aneurysmal dilation. Reproductive: Prior hysterectomy, no enhancing soft tissue nodularity along the vaginal cuff. No suspicious adnexal lesions. Other: Tiny fat containing bilateral inguinal hernias. Musculoskeletal: Postsurgical change of left total hip arthroplasty with a 14.7 x 4.2 x 2.1 cm fluid collection in the subcutaneous soft tissues overlying the hip subjacent to the incision site on image 79/2 and 123/5. Multilevel degenerative changes spine. No aggressive lytic or blastic lesion of bone. IMPRESSION: 1. Status post hysterectomy and bilateral salpingo oophorectomy. No findings of local recurrence or metastatic disease in the abdomen or pelvis. 2. Postsurgical change of interval left total hip arthroplasty with a 14.7 X 4.2 x 2.1 cm fluid collection in the subcutaneous soft tissues overlying the hip subjacent to the incision site, which may represent a postoperative hematoma or seroma. 3. Decreased size of the evolving perisplenic fluid collection, likely representing a chronic hematoma. 4. Moderate hiatal hernia. 5.  Aortic Atherosclerosis (ICD10-I70.0). Electronically Signed   By: Jeffrey  Waltz MD   On: 07/10/2020 16:56    ASSESSMENT & PLAN:  Ovarian cancer, bilateral (HCC) I have reviewed her blood work and CT imaging with the patient She has no signs of cancer recurrence She will return in 3 months for further follow-up  Post-op pain Her hip pain is likely due to postop hematoma I would defer to her orthopedic surgeon for further evaluation and management  Splenic lesion To splenic lesion is smaller I reassured the patient I recommend future follow-up 6 months to a year  No orders of the defined types were placed in this encounter.   All questions were  answered. The patient knows to call the clinic with any problems, questions or concerns. The total time spent in the appointment was 20 minutes encounter with patients including review of chart and various tests results, discussions about plan of care and coordination of care plan   Ni Gorsuch, MD 07/12/2020 12:55 PM    

## 2020-07-12 NOTE — Assessment & Plan Note (Signed)
To splenic lesion is smaller I reassured the patient I recommend future follow-up 6 months to a year

## 2020-07-14 ENCOUNTER — Other Ambulatory Visit (HOSPITAL_COMMUNITY): Payer: Self-pay

## 2020-07-14 DIAGNOSIS — C563 Malignant neoplasm of bilateral ovaries: Secondary | ICD-10-CM

## 2020-07-18 ENCOUNTER — Other Ambulatory Visit (HOSPITAL_COMMUNITY): Payer: Self-pay | Admitting: Hematology

## 2020-07-23 IMAGING — DX PORTABLE CHEST - 1 VIEW
1 series · 1 of 1 positions shown · non-contrast
Comparison: July 07, 2018

CLINICAL DATA: Left-sided pleural effusion status post
thoracentesis

EXAM:
PORTABLE CHEST 1 VIEW

[chest ap]
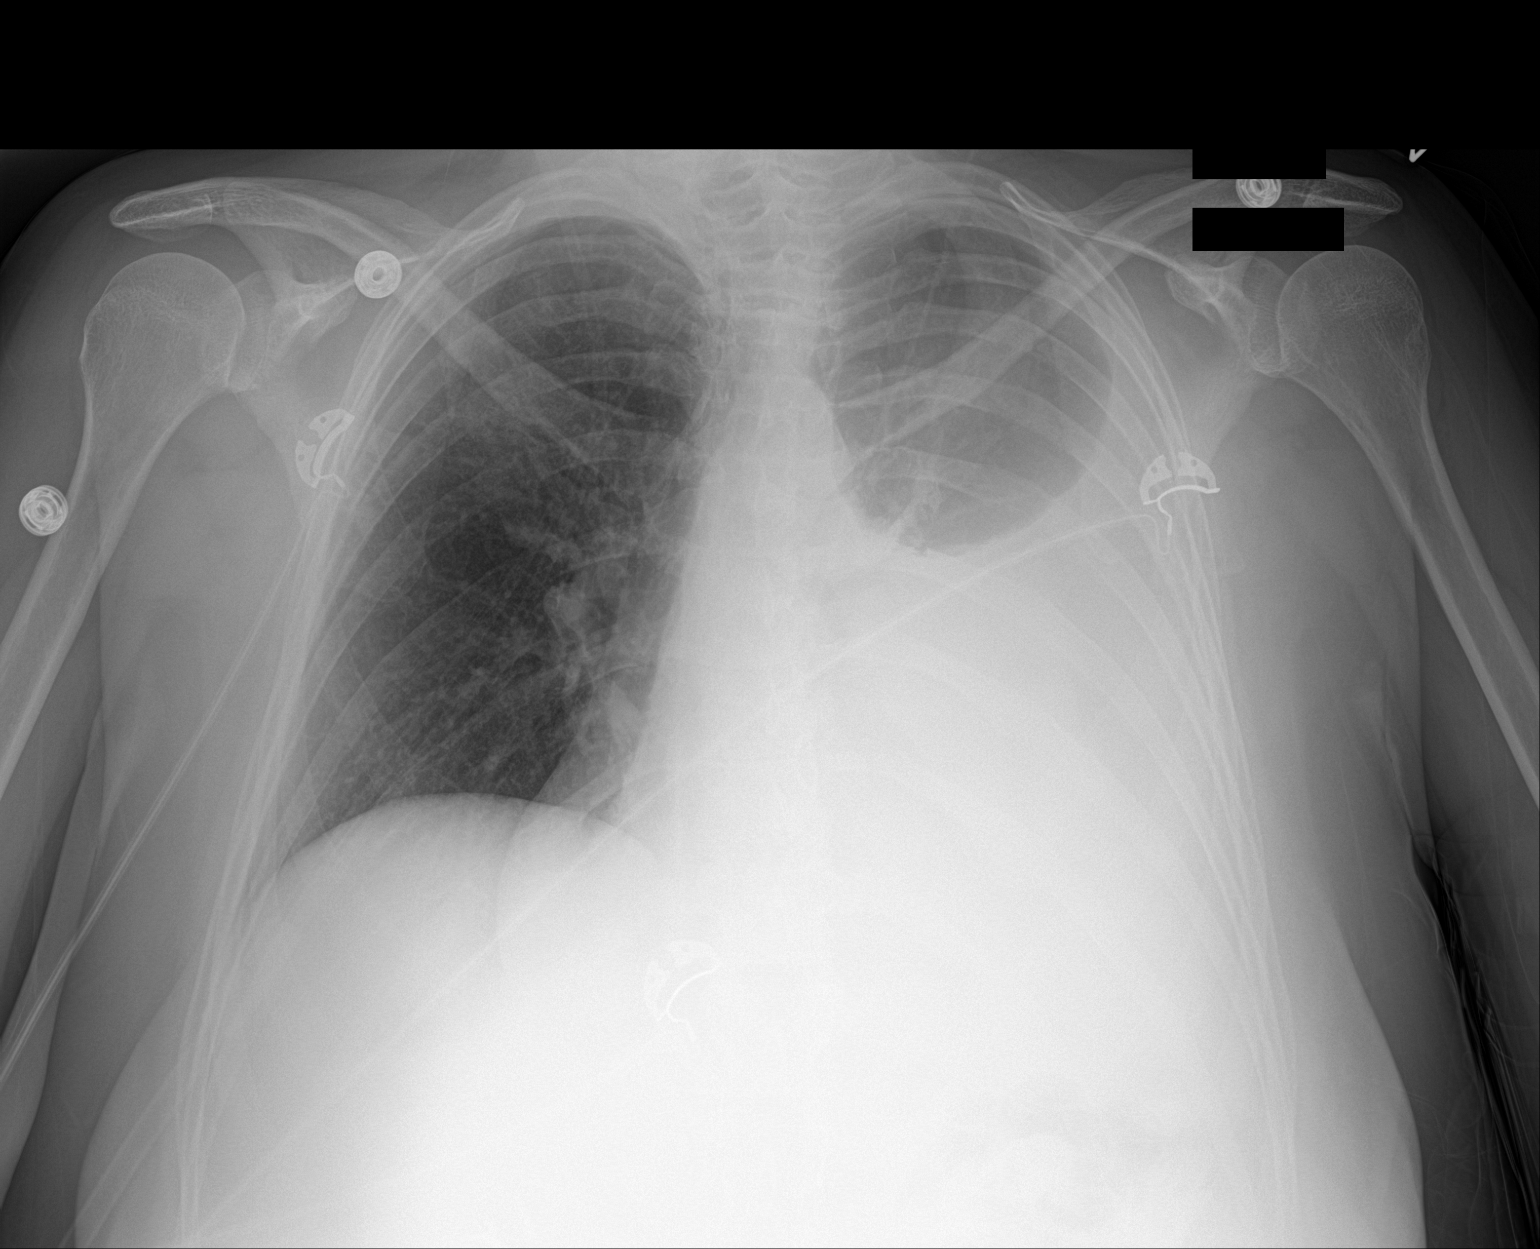

[1 of 1 positions shown; findings below may reference images not displayed]

FINDINGS: There is a persistent large left-sided pleural effusion. There is no
pneumothorax. The heart is difficult to fully evaluate on this exam.
The right lung field is mostly clear. There is some mild volume
overload.
IMPRESSION: Interval decrease in size of the previously demonstrated left-sided
pleural effusion. There is a persistent large left-sided pleural
effusion without evidence of a pneumothorax.

## 2020-07-23 IMAGING — DX CHEST  1 VIEW
1 series · 1 of 1 positions shown · non-contrast
Comparison: Radiographs 07/03/2018 and 06/24/2016.  CT 07/06/2018.

CLINICAL DATA: Post thoracentesis on the left.

EXAM:
CHEST  1 VIEW

[x chest ap]
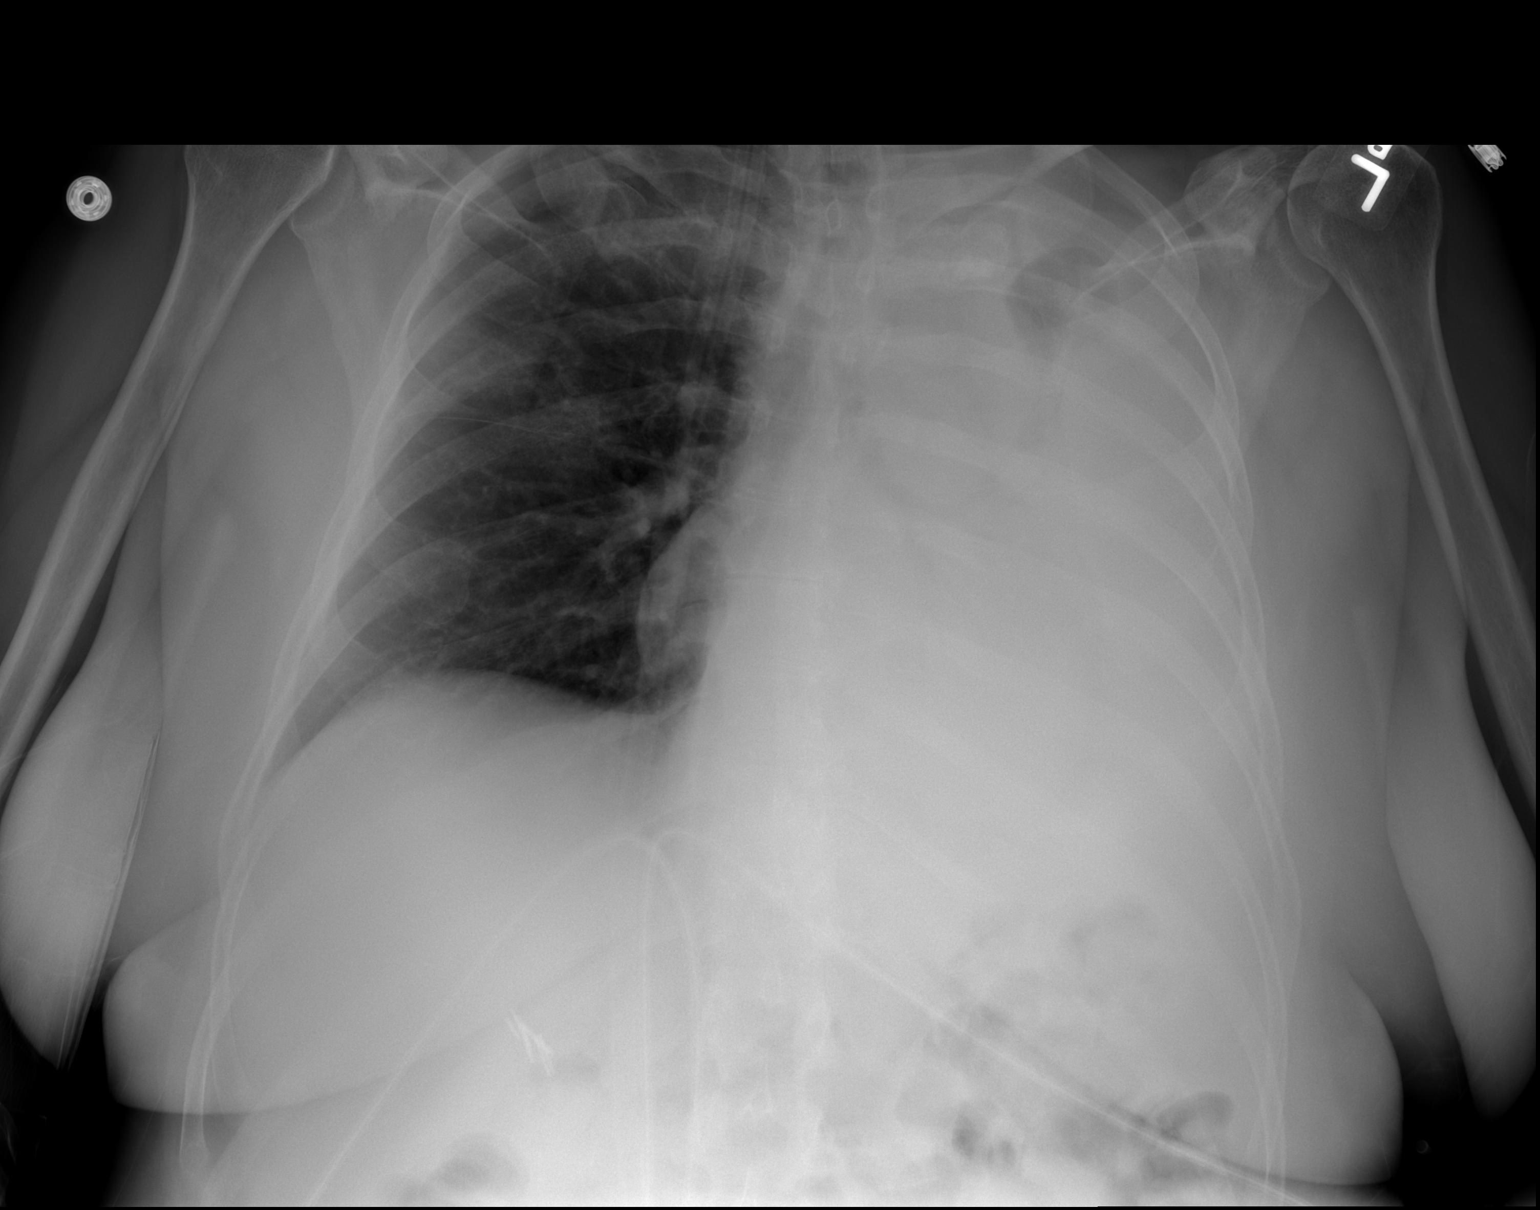

[1 of 1 positions shown; findings below may reference images not displayed]

FINDINGS: There has been partial re-expansion of the left lung following
thoracentesis. There is still a sizable left pleural effusion with
near complete whiteout of the left hemithorax. No residual
mediastinal shift or pneumothorax identified. The right lung is
clear.
IMPRESSION: No pneumothorax following thoracentesis on the left. Residual
sizable left pleural effusion and left lung collapse.

## 2020-07-23 IMAGING — US IR THORACENTESIS ASP PLEURAL SPACE W/IMG GUIDE
1 series · 2 of 2 positions shown · non-contrast
Comparison: none

INDICATION: Patient with history of migraines, tobacco abuse and new onset
dyspnea with large left pleural effusion of uncertain etiology.
Request for diagnostic and therapeutic left side thoracentesis today
in IR.

[Series 1: ir (id) (id)/(id)/(id) ir · 2 of 2 slices shown]
[im 1/2]
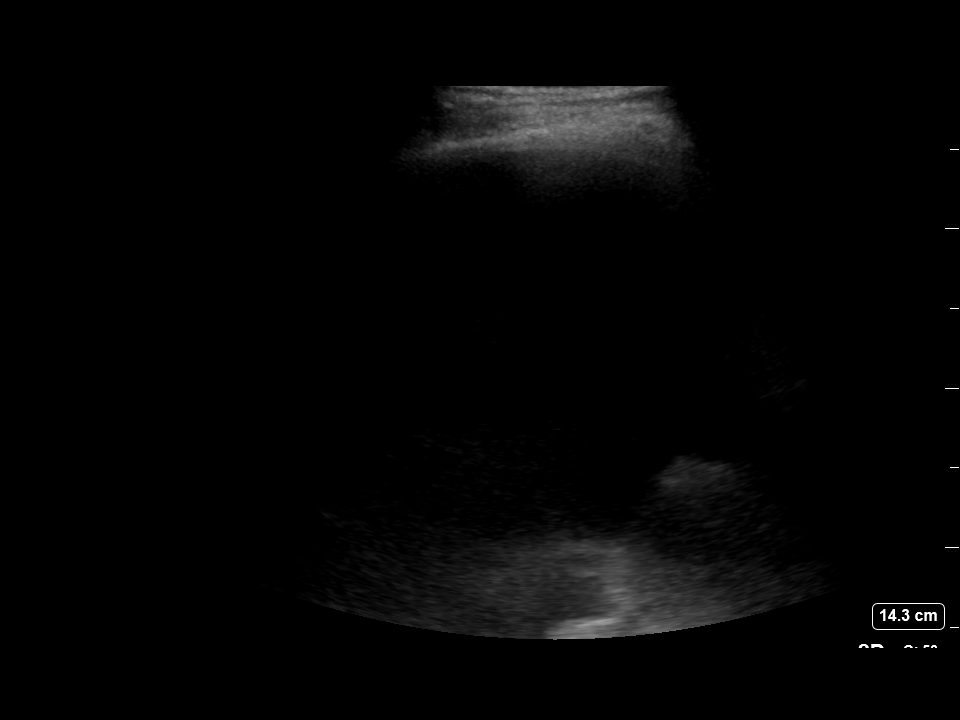
[im 2/2]
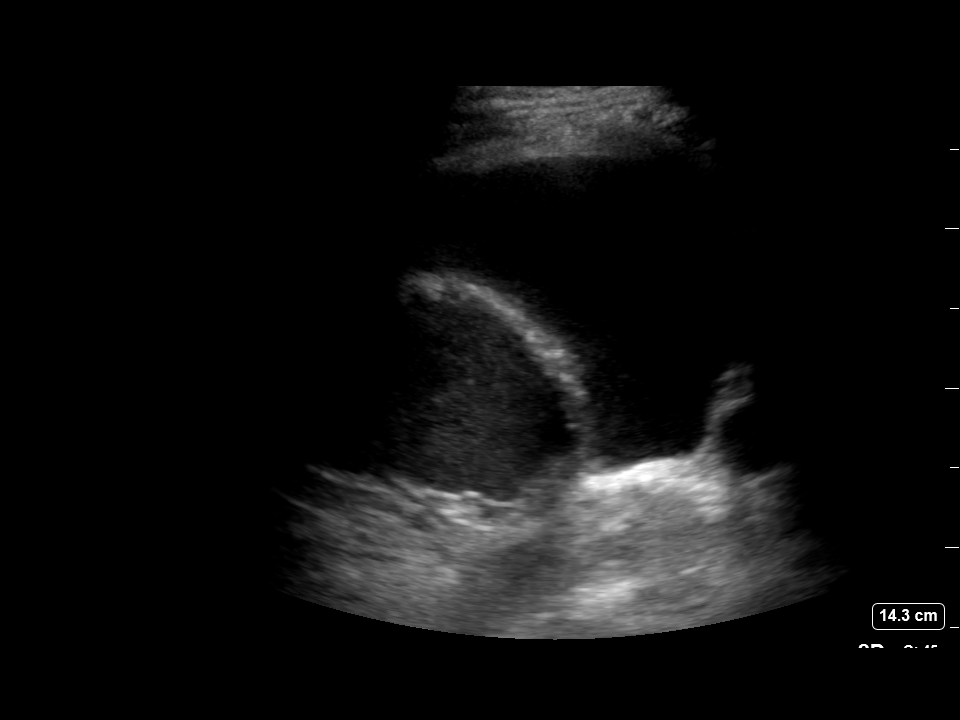

[2 of 2 positions shown; findings below may reference images not displayed]

EXAM:
ULTRASOUND GUIDED LEFT THORACENTESIS

MEDICATIONS:
13 mL 1% lidocaine

COMPLICATIONS:
None immediate.

PROCEDURE:
An ultrasound guided thoracentesis was thoroughly discussed with the
patient and questions answered. The benefits, risks, alternatives
and complications were also discussed. The patient understands and
wishes to proceed with the procedure. Written consent was obtained.

Ultrasound was performed to localize and mark an adequate pocket of
fluid in the left chest. The area was then prepped and draped in the
normal sterile fashion. 1% Lidocaine was used for local anesthesia.
Under ultrasound guidance a 6 Fr Safe-T-Centesis catheter was
introduced. Thoracentesis was performed. The catheter was removed
and a dressing applied.
FINDINGS: A total of approximately 2.0 L of golden yellow, blood tinged fluid
was removed. Samples were sent to the laboratory as requested by the
clinical team.
IMPRESSION: Successful ultrasound guided left thoracentesis yielding 2.0 L of
pleural fluid.

Read by Mulliez, Cukrowy

## 2020-07-24 ENCOUNTER — Other Ambulatory Visit (HOSPITAL_COMMUNITY): Payer: Self-pay

## 2020-07-24 DIAGNOSIS — C563 Malignant neoplasm of bilateral ovaries: Secondary | ICD-10-CM

## 2020-07-24 IMAGING — XA IR PICC <5YO
1 series · 1 of 1 positions shown · non-contrast
Comparison: none

CLINICAL DATA: Large left pleural effusion, poor IV access

EXAM:
PICC PLACEMENT WITH ULTRASOUND AND FLUOROSCOPY
FLUOROSCOPY TIME:  18 seconds; 3 mGy
TECHNIQUE: After written informed consent was obtained, patient was placed in
the supine position on angiographic table. Patency of the right
brachial vein was confirmed with ultrasound with image
documentation. An appropriate skin site was determined. Skin site
was marked. Region was prepped using maximum barrier technique
including cap and mask, sterile gown, sterile gloves, large sterile
sheet, and Chlorhexidine as cutaneous antisepsis. The region was
infiltrated locally with 1% lidocaine. Under real-time ultrasound
guidance, the right brachial vein was accessed with a 21 gauge
micropuncture needle; the needle tip within the vein was confirmed
with ultrasound image documentation. Needle exchanged over a 018
guidewire for a peel-away sheath, through which a 5-French
double-lumen power injectable PICC trimmed to 38cm was advanced,
positioned with its tip near the cavoatrial junction. Spot chest
radiograph confirms appropriate catheter position. Catheter was
flushed per protocol and secured externally. The patient tolerated
procedure well.
COMPLICATIONS:
COMPLICATIONS
none

[Series 1: fl angio · 1 of 1 slices shown]
[im 1/1]
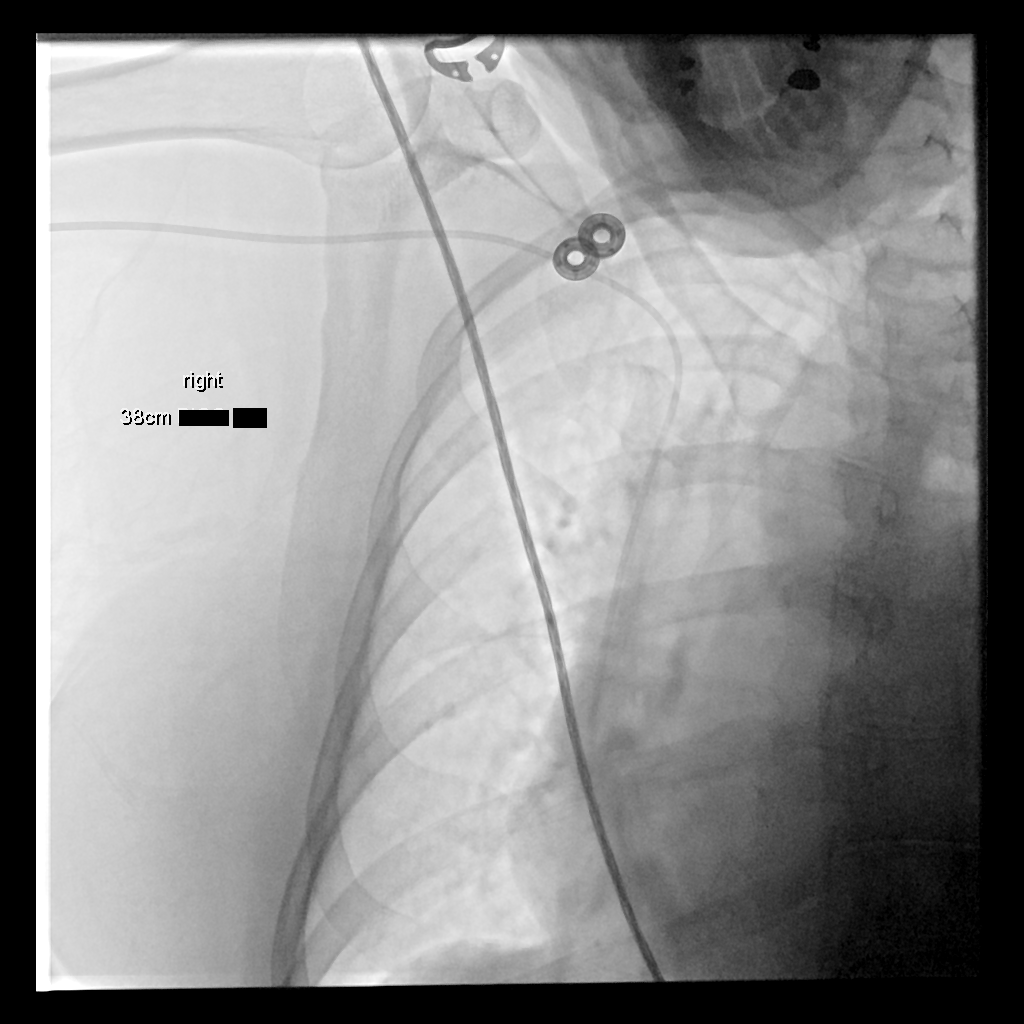

[1 of 1 positions shown; findings below may reference images not displayed]

IMPRESSION: 1. Technically successful five French double lumen power injectable
PICC placement

## 2020-07-24 MED ORDER — OXYCODONE HCL 10 MG PO TABS: 10.0000 mg | ORAL_TABLET | ORAL | 0 refills | Status: DC | PRN

## 2020-07-25 ENCOUNTER — Ambulatory Visit (INDEPENDENT_AMBULATORY_CARE_PROVIDER_SITE_OTHER): Payer: Medicaid Other | Admitting: Orthopaedic Surgery

## 2020-07-25 ENCOUNTER — Ambulatory Visit (INDEPENDENT_AMBULATORY_CARE_PROVIDER_SITE_OTHER): Payer: Medicaid Other

## 2020-07-25 ENCOUNTER — Other Ambulatory Visit: Payer: Self-pay

## 2020-07-25 DIAGNOSIS — Z96642 Presence of left artificial hip joint: Secondary | ICD-10-CM

## 2020-07-25 MED ORDER — TIZANIDINE HCL 4 MG PO TABS: 4.0000 mg | ORAL_TABLET | Freq: Three times a day (TID) | ORAL | 0 refills | Status: DC | PRN

## 2020-07-25 NOTE — Progress Notes (Signed)
Post-Op Visit Note   Patient: Michelle Holt           Date of Birth: [birth year]           MRN: [id number] Visit Date: 07/25/2020 PCP: [name], MD   Assessment & Plan:  Chief Complaint:  Chief Complaint  Patient presents with   Left Hip - Follow-up   Visit Diagnoses:  1. Status post total replacement of left hip     Plan: Patient is a pleasant 50 year old female who comes in today 6 weeks out left anterior total hip replacement 06/05/2020.  She was doing well the first week of surgery but then began having increased pain throughout the left hip including the groin and anterior thigh.  This is gradually worsened over time.  No fevers or chills.  She had a CT scan which showed a small seroma.  She has not been to any formal physical therapy.  Examination of her left hip reveals a fully healed surgical scar without complication.  No evidence of seroma.  No periincisional tenderness.  No pain with logroll or FADIR.  She does have a fair amount of pain and weakness with resisted hip flexion.  She is neurovascular intact distally.  At this point, believe her symptoms are primarily coming from her hip flexors and her lack of strength.  I have discussed starting her in formal physical therapy for which she is agreeable to.  She will follow-up with [place] in 6 weeks time for recheck.  I refilled her muscle relaxer.  Dental prophylaxis reinforced.  Call with any concerns or questions in the meantime.  Follow-Up Instructions: Return in about 6 weeks (around 09/05/2020).   Orders:  Orders Placed This Encounter  Procedures   XR Pelvis 1-2 Views   Ambulatory referral to Physical Therapy   Meds ordered this encounter  Medications   tiZANidine (ZANAFLEX) 4 MG tablet    Sig: Take 1 tablet (4 mg total) by mouth 3 (three) times daily as needed for muscle spasms.    Dispense:  60 tablet    Refill:  0    Imaging: XR Pelvis 1-2 Views  Result Date: 07/25/2020 Well-seated prosthesis without  complication   PMFS History: Patient Active Problem List   Diagnosis Date Noted   Post-op pain 07/12/2020   Splenic lesion 07/12/2020   Status post total replacement of left hip 06/05/2020   Primary osteoarthritis of left hip 02/09/2020   Pain in right knee 02/09/2020   Palliative care patient 09/18/2018   Port-A-Cath in place 08/20/2018   Genetic testing 08/20/2018   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[facility]) 08/17/2018   Splenic laceration 07/31/2018   Family history of uterine cancer    Family history of breast cancer    Symptomatic anemia 07/18/2018   Upper GI bleeding 07/18/2018   Ovarian cancer, bilateral 07/18/2018   HCAP (healthcare-associated pneumonia) 07/18/2018   Pleural effusion 07/07/2018   Migraine 07/06/2018   Pleural effusion on left 07/06/2018   Tobacco abuse 07/06/2018   Generalized anxiety disorder 05/02/2014   Unspecified constipation 08/10/2013   Rectal bleeding 08/10/2013   Lumbosacral spondylosis without myelopathy 10/23/2011   Intractable migraine without aura 10/23/2011   Past Medical History:  Diagnosis Date   Anemia    Anxiety and depression    Arthritis of facet joints at multiple vertebral levels    L5-S1   Constipation    Dyslipidemia    Family history of breast cancer    Family history  of uterine cancer    GERD (gastroesophageal reflux disease)    History of hiatal hernia    History of kidney stones    Insomnia    Irritable bowel syndrome    Migraine    Muscle tension headache    Neuropathy of finger    Ovarian carcinoma (HCC)    ovarian   Plantar fasciitis of right foot    Port-A-Cath in place 08/20/2018    Family History  Problem Relation Age of Onset   Hypertension Mother    Obesity Mother    Diabetes Mother    Kidney disease Mother    Peripheral vascular disease Father    Atrial fibrillation Father    COPD Brother    Osteoporosis Brother    Crohn's disease Sister    Uterine cancer Sister 47       maternal  half sister   Breast cancer Maternal Aunt 42   Colon cancer Neg Hx     Past Surgical History:  Procedure Laterality Date   ABDOMINAL HYSTERECTOMY     CHOLECYSTECTOMY  2008   COLONOSCOPY N/A 08/13/2013   Procedure: COLONOSCOPY;  Surgeon: Rogene Houston, MD;  [facility];  Service: Endoscopy;  Laterality: N/A;  230-moved to 145 Ann to notify pt   ESOPHAGOGASTRODUODENOSCOPY     ESOPHAGOGASTRODUODENOSCOPY (EGD) WITH PROPOFOL N/A 07/20/2018   Procedure: ESOPHAGOGASTRODUODENOSCOPY (EGD) WITH PROPOFOL;  Surgeon: Rogene Houston, MD;  [facility];  Service: Endoscopy;  Laterality: N/A;  Possible esophageal dilation.   IR ANGIOGRAM SELECTIVE EACH ADDITIONAL VESSEL  08/01/2018   IR ANGIOGRAM VISCERAL SELECTIVE  08/01/2018   IR EMBO ART  VEN HEMORR LYMPH EXTRAV  INC GUIDE ROADMAPPING  08/01/2018   IR IMAGING GUIDED PORT INSERTION  08/20/2018   IR PERC PLEURAL DRAIN W/INDWELL CATH W/IMG GUIDE  07/08/2018   IR THORACENTESIS ASP PLEURAL SPACE W/IMG GUIDE  07/07/2018   IR US GUIDE VASC ACCESS RIGHT  08/01/2018   PLEURAL EFFUSION DRAINAGE Left 07/13/2018   Procedure: DRAINAGE OF LOCULATED PLEURAL EFFUSION;  Surgeon: Ivin Poot, MD;  Location: [place];  Service: Thoracic;  Laterality: Left;   REMOVAL OF PLEURAL DRAINAGE CATHETER Left 08/20/2018   Procedure: REMOVAL OF PLEURAL DRAINAGE CATHETER;  Surgeon: Ivin Poot, MD;  Location: [place];  Service: Thoracic;  Laterality: Left;   REMOVAL OF PLEURAL DRAINAGE CATHETER Left 08/20/2018   Procedure: REMOVAL OF PLEURAL DRAINAGE CATHETER;  Surgeon: Ivin Poot, MD;  Location: [place];  Service: Thoracic;  Laterality: Left;   TALC PLEURODESIS Left 07/13/2018   Procedure: Talc Pleuradesis;  Surgeon: Prescott Gum, Collier Salina, MD;  Location: [place];  Service: Thoracic;  Laterality: Left;   TOTAL HIP ARTHROPLASTY Left 06/05/2020   Procedure: LEFT TOTAL HIP ARTHROPLASTY ANTERIOR APPROACH;  Surgeon: Leandrew Koyanagi, MD;  Location: [place];  Service: Orthopedics;   Laterality: Left;  3-C   TUBAL LIGATION Bilateral    UTERINE ABLATION     VIDEO ASSISTED THORACOSCOPY Left 07/13/2018   Procedure: VIDEO ASSISTED THORACOSCOPY;  Surgeon: Ivin Poot, MD;  [facility]: [facility];  Service: Thoracic;  Laterality: Left;   Social History   Occupational History    Employer: [employer]  Tobacco Use   Smoking status: Former    Packs/day: 0.50    Years: 17.00    Pack years: 8.50    Types: Cigarettes    Quit date: 06/22/2018    Years since quitting: 2.0   Smokeless tobacco: Never  Vaping Use  Vaping Use: Never used  Substance and Sexual Activity   Alcohol use: Yes    Alcohol/week: 1.0 standard drink    Types: 1 Glasses of wine per week    Comment: Drinks alcohol on occasion   Drug use: No   Sexual activity: Not on file

## 2020-07-29 IMAGING — CR PORTABLE CHEST - 1 VIEW
1 series · 1 of 1 positions shown · non-contrast
Comparison: 07/11/2018 and earlier.
COMPARISON: 07/11/2018 and earlier.

Addendum:
CLINICAL DATA: 47-year-old female status post left VATS today for
loculated effusion. Postoperative day zero.

EXAM:
PORTABLE CHEST 1 VIEW

[AP]
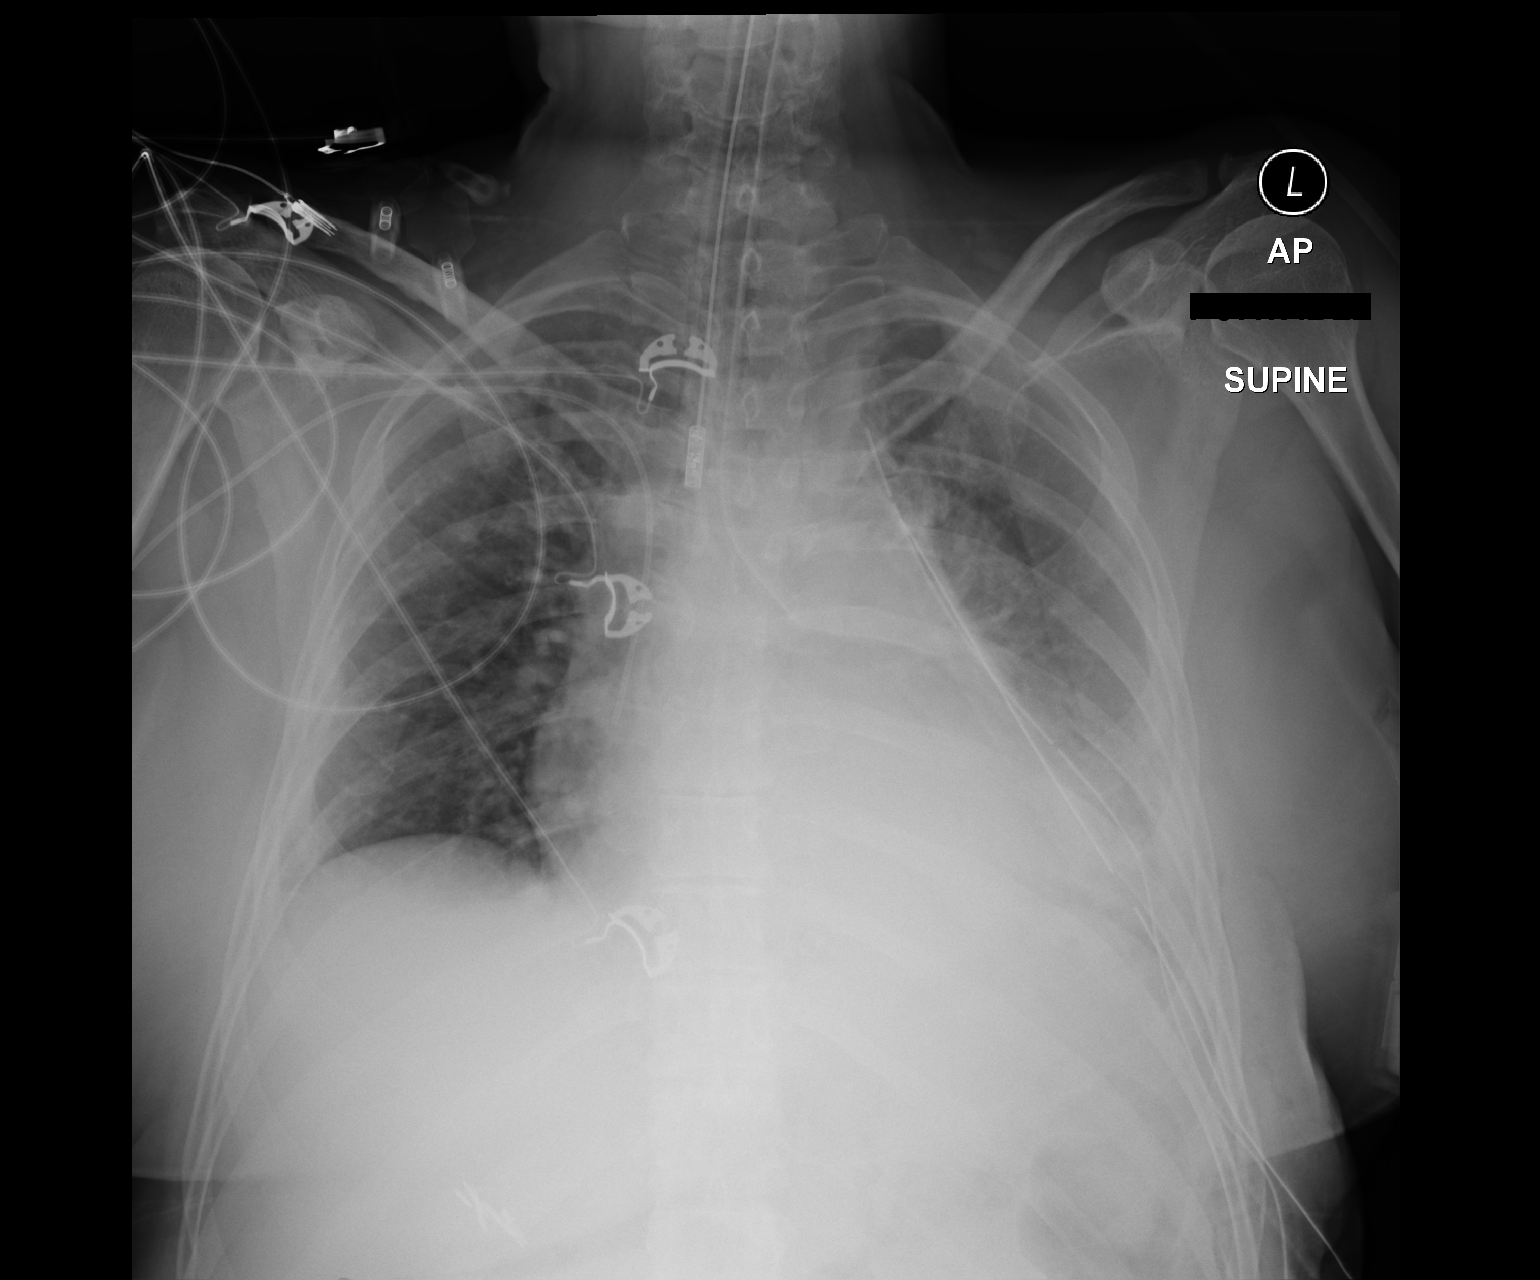

[1 of 1 positions shown; findings below may reference images not displayed]

FINDINGS: Portable AP supine view at 5955 hours. Two left chest tubes are in
place. Both tubes course toward the medial left apex. No
pneumothorax identified.

Endotracheal tube in place with evidence of a left mainstem bronchus
intubation.

Stable right IJ PICC line.

Lower lung volumes. Stable cardiac size and mediastinal contours.
Regressed but not fully resolved dense left lung base opacity.
Negative visible bowel gas pattern. Stable cholecystectomy clips.
IMPRESSION: 1. Left mainstem bronchus intubation suspected. Withdrawal the ET
tube 2 cm and repeat a portable chest.
2. Two left chest tubes in place. No pneumothorax identified.
3. Lower lung volumes.  Mildly improved left lung base ventilation.

ADDENDUM:
The patient's RN reports that the patient has been extubated at this
time.

*** End of Addendum ***
FINDINGS: Portable AP supine view at 5955 hours. Two left chest tubes are in
place. Both tubes course toward the medial left apex. No
pneumothorax identified.

Endotracheal tube in place with evidence of a left mainstem bronchus
intubation.

Stable right IJ PICC line.

Lower lung volumes. Stable cardiac size and mediastinal contours.
Regressed but not fully resolved dense left lung base opacity.
Negative visible bowel gas pattern. Stable cholecystectomy clips.
IMPRESSION: 1. Left mainstem bronchus intubation suspected. Withdrawal the ET
tube 2 cm and repeat a portable chest.
2. Two left chest tubes in place. No pneumothorax identified.
3. Lower lung volumes.  Mildly improved left lung base ventilation.

## 2020-08-10 ENCOUNTER — Other Ambulatory Visit (HOSPITAL_COMMUNITY): Payer: Self-pay | Admitting: Hematology

## 2020-08-14 ENCOUNTER — Ambulatory Visit (HOSPITAL_COMMUNITY): Payer: Medicaid Other | Attending: Physician Assistant

## 2020-08-14 ENCOUNTER — Encounter (HOSPITAL_COMMUNITY): Payer: Self-pay

## 2020-08-14 ENCOUNTER — Other Ambulatory Visit: Payer: Self-pay

## 2020-08-14 DIAGNOSIS — R2681 Unsteadiness on feet: Secondary | ICD-10-CM | POA: Diagnosis present

## 2020-08-14 DIAGNOSIS — M6281 Muscle weakness (generalized): Secondary | ICD-10-CM | POA: Diagnosis present

## 2020-08-14 DIAGNOSIS — R262 Difficulty in walking, not elsewhere classified: Secondary | ICD-10-CM | POA: Insufficient documentation

## 2020-08-14 NOTE — Therapy (Signed)
[place], [place], [address] Phone: [phone]   Fax:  [phone]  Physical Therapy Evaluation  Patient Details  Name: Michelle Holt MRN: [id number] Date of Birth: [birth year] Referring Provider (PT): Erlinda Hong   Encounter Date: 08/14/2020   PT End of Session - 08/14/20 1107     Visit Number 1    Number of Visits 12    Date for PT Re-Evaluation 09/25/20    Authorization Type [place] Medicaid [employer], auth required    Progress Note Due on Visit 10    PT Start Time 1115    PT Stop Time 1200    PT Time Calculation (min) 45 min    Equipment Utilized During Treatment Gait belt    Activity Tolerance Patient tolerated treatment well             Past Medical History:  Diagnosis Date   Anemia    Anxiety and depression    Arthritis of facet joints at multiple vertebral levels    L5-S1   Constipation    Dyslipidemia    Family history of breast cancer    Family history of uterine cancer    GERD (gastroesophageal reflux disease)    History of hiatal hernia    History of kidney stones    Insomnia    Irritable bowel syndrome    Migraine    Muscle tension headache    Neuropathy of finger    Ovarian carcinoma ([place])    ovarian   Plantar fasciitis of right foot    Port-A-Cath in place 08/20/2018    Past Surgical History:  Procedure Laterality Date   ABDOMINAL HYSTERECTOMY     CHOLECYSTECTOMY  2008   COLONOSCOPY N/A 08/13/2013   Procedure: COLONOSCOPY;  Surgeon: Rogene Houston, MD;  [facility];  Service: Endoscopy;  Laterality: N/A;  230-moved to 145 Ann to notify pt   ESOPHAGOGASTRODUODENOSCOPY     ESOPHAGOGASTRODUODENOSCOPY (EGD) WITH PROPOFOL N/A 07/20/2018   Procedure: ESOPHAGOGASTRODUODENOSCOPY (EGD) WITH PROPOFOL;  Surgeon: Rogene Houston, MD;  [facility];  Service: Endoscopy;  Laterality: N/A;  Possible esophageal dilation.   IR ANGIOGRAM SELECTIVE EACH ADDITIONAL VESSEL   08/01/2018   IR ANGIOGRAM VISCERAL SELECTIVE  08/01/2018   IR EMBO ART  VEN HEMORR LYMPH EXTRAV  INC GUIDE ROADMAPPING  08/01/2018   IR IMAGING GUIDED PORT INSERTION  08/20/2018   IR PERC PLEURAL DRAIN W/INDWELL CATH W/IMG GUIDE  07/08/2018   IR THORACENTESIS ASP PLEURAL SPACE W/IMG GUIDE  07/07/2018   IR US GUIDE VASC ACCESS RIGHT  08/01/2018   PLEURAL EFFUSION DRAINAGE Left 07/13/2018   Procedure: DRAINAGE OF LOCULATED PLEURAL EFFUSION;  Surgeon: Ivin Poot, MD;  Location: [place];  Service: Thoracic;  Laterality: Left;   REMOVAL OF PLEURAL DRAINAGE CATHETER Left 08/20/2018   Procedure: REMOVAL OF PLEURAL DRAINAGE CATHETER;  Surgeon: Ivin Poot, MD;  Location: [place];  Service: Thoracic;  Laterality: Left;   REMOVAL OF PLEURAL DRAINAGE CATHETER Left 08/20/2018   Procedure: REMOVAL OF PLEURAL DRAINAGE CATHETER;  Surgeon: Ivin Poot, MD;  Location: [place];  Service: Thoracic;  Laterality: Left;   TALC PLEURODESIS Left 07/13/2018   Procedure: Talc Pleuradesis;  Surgeon: Prescott Gum, Collier Salina, MD;  Location: [place];  Service: Thoracic;  Laterality: Left;   TOTAL HIP ARTHROPLASTY Left 06/05/2020   Procedure: LEFT TOTAL HIP ARTHROPLASTY ANTERIOR APPROACH;  Surgeon: Leandrew Koyanagi, MD;  Location: [place];  Service:  Orthopedics;  Laterality: Left;  3-C   TUBAL LIGATION Bilateral    UTERINE ABLATION     VIDEO ASSISTED THORACOSCOPY Left 07/13/2018   Procedure: VIDEO ASSISTED THORACOSCOPY;  Surgeon: Ivin Poot, MD;  Location: [place];  Service: Thoracic;  Laterality: Left;    There were no vitals filed for this visit.    Subjective Assessment - 08/14/20 1113     Subjective Left THR on 06/05/20 and unfortunately only had one PT session via [facility]. Pt notes continued weakness in LLE with difficulty lifting LE into bed. Pt notes pain in her left buttocks and knees which cause difficulty with walking/sleeping. Chief complaint is difficulty walking and increased difficulty with negotiating stairs    How long  can you walk comfortably? 5-10 minutes    Patient Stated Goals "Get my legs back"    Currently in Pain? No/denies                [facility] PT Assessment - 08/14/20 0001       Assessment   Medical Diagnosis Lt THR, anterior approach    Referring Provider (PT) Xu      Balance Screen   Has the patient fallen in the past 6 months No    Has the patient had a decrease in activity level because of a fear of falling?  No    Is the patient reluctant to leave their home because of a fear of falling?  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      ROM / Strength   AROM / PROM / Strength AROM;Strength      Strength   Strength Assessment Site Hip    Right/Left Hip Left    Left Hip Flexion 3-/5    Left Hip Extension 2+/5    Left Hip External Rotation 3-/5    Left Hip Internal Rotation 3-/5    Left Hip ABduction 3-/5    Left Hip ADduction 3/5      Bed Mobility   Bed Mobility Supine to Sit;Sit to Supine    Supine to Sit Independent    Sit to Supine Independent      Transfers   Transfers Sit to Stand    Five time sit to stand comments  19 sec      Ambulation/Gait   Ambulation/Gait Yes    Ambulation/Gait Assistance 7: Independent    Ambulation Distance (Feet) 250 Feet    Assistive device None    Gait Pattern Step-through pattern;Decreased stride length;Decreased stance time - left    Ambulation Surface Level;Indoor    Gait velocity decreased    Stairs Yes    Stairs Assistance 6: Modified independent (Device/Increase time)    Stair Management Technique Two rails;Step to pattern    Gait Comments 2MWT                        Objective measurements completed  on examination: See above findings.       [facility] Adult PT Treatment/Exercise - 08/14/20 0001       Exercises  Exercises Knee/Hip      Knee/Hip Exercises: Supine   Quad Sets Strengthening;Left;2 sets;10 reps    Hip Adduction Isometric Strengthening;2 sets;10 reps    Other Supine Knee/Hip Exercises clamshell isometric 2x10    Other Supine Knee/Hip Exercises hip flexion isometric 2x10                    PT Education - 08/14/20 1146     Education Details education on assessment findings and HEP initiation    Person(s) Educated Patient    Methods Explanation;Handout    Comprehension Verbalized understanding              PT Short Term Goals - 08/14/20 1153       PT SHORT TERM GOAL #1   Title Patient will be independent with HEP in order to improve functional outcomes.    Time 3    Period Weeks    Status New    Target Date 09/04/20      PT SHORT TERM GOAL #2   Title Patient will report at least 25% improvement in symptoms for improved quality of life.    Time 3    Period Weeks    Status New    Target Date 09/04/20      PT SHORT TERM GOAL #3   Title Manifest improved BLE strength and balance as evidenced by time of 14 sec 5xSTS    Baseline 19 sec    Time 3    Period Weeks    Status New    Target Date 09/04/20               PT Long Term Goals - 08/14/20 1154       PT LONG TERM GOAL #1   Title Demonstrate improved gait velocity as evidenced by distance of 350 ft during 2MWT    Baseline 250 ft    Time 6    Period Weeks    Status New    Target Date 09/25/20      PT LONG TERM GOAL #2   Title Demo left hip abduction strength 4/5 to improve gait stability    Baseline 3-/5 left hip abduction    Time 6    Period Weeks    Status New    Target Date 09/25/20                    Plan - 08/14/20 1147     Clinical Impression Statement Pt is 50 yo lady s/p left hip THR via anterior approach. Demonstrates pronounced LLE residual weakness  with accompanying gait/balance deficits with high risk for falls as evidenced by balance and mobility assessments coupled with decrease in gait velocity. 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    Personal Factors and Comorbidities Past/Current Experience    Examination-Activity Limitations Carry;Lift;Stand;Stairs;Squat;Locomotion Level;Transfers;Bed Mobility;Bend    Examination-Participation Restrictions Cleaning;Community Activity;Occupation;Meal Prep;Shop    Stability/Clinical Decision Making Stable/Uncomplicated    Clinical Decision Making Low    Rehab Potential Excellent    PT Frequency 2x / week    PT Duration 6 weeks    PT Treatment/Interventions ADLs/Self Care Home Management;Aquatic Therapy;Electrical Stimulation;DME Instruction;Gait training;Stair training;Functional mobility training;Therapeutic activities;Therapeutic exercise;Balance training;Patient/family education;Neuromuscular re-education;Manual techniques;Taping;Dry needling;Spinal Manipulations;Joint Manipulations    PT Next Visit Plan progress HEP to dynamic exercises    PT Home Exercise Plan  LE isometrics    Consulted and Agree with Plan of Care Patient             Patient will benefit from skilled therapeutic intervention in order to improve the following deficits and impairments:  Abnormal gait, Decreased activity tolerance, Decreased balance, Decreased mobility, Decreased endurance, Decreased strength, Difficulty walking, Improper body mechanics, Pain  Visit Diagnosis: Muscle weakness (generalized)  Difficulty in walking, not elsewhere classified  Unsteadiness on feet     Problem List Patient Active Problem List   Diagnosis Date Noted   Post-op pain 07/12/2020   Splenic lesion 07/12/2020   Status post total replacement of left hip 06/05/2020   Primary osteoarthritis of left hip  02/09/2020   Pain in right knee 02/09/2020   Palliative care patient 09/18/2018   Port-A-Cath in place 08/20/2018   Genetic testing 08/20/2018   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[place]) 08/17/2018   Splenic laceration 07/31/2018   Family history of uterine cancer    Family history of breast cancer    Symptomatic anemia 07/18/2018   Upper GI bleeding 07/18/2018   Ovarian cancer, bilateral 07/18/2018   HCAP (healthcare-associated pneumonia) 07/18/2018   Pleural effusion 07/07/2018   Migraine 07/06/2018   Pleural effusion on left 07/06/2018   Tobacco abuse 07/06/2018   Generalized anxiety disorder 05/02/2014   Unspecified constipation 08/10/2013   Rectal bleeding 08/10/2013   Lumbosacral spondylosis without myelopathy 10/23/2011   Intractable migraine without aura 10/23/2011   11:56 AM, 08/14/20 M. Sherlyn Lees, PT, DPT Physical Therapist- [place] Office Number: [phone]   [facility] [address] [place], [place], [address] Phone: [phone]   Fax:  [phone]  Name: Michelle Holt MRN: [id number] Date of Birth: [birth year]

## 2020-08-14 NOTE — Patient Instructions (Signed)
Access Code: [email or web address] URL: [email or web address] Date: 08/14/2020 Prepared by: Sherlyn Lees  Exercises Supine Quad Set - 1 x daily - 7 x weekly - 3 sets - 10 reps - 2 sec hold Hooklying Isometric Hip Abduction Adduction with Belt and Ball - 1 x daily - 7 x weekly - 3 sets - 10 reps - 2 sec hold Hooklying Isometric Hip Flexion - 1 x daily - 7 x weekly - 3 sets - 10 reps - 2 sec hold

## 2020-08-16 ENCOUNTER — Ambulatory Visit (HOSPITAL_COMMUNITY): Payer: Medicaid Other

## 2020-08-16 ENCOUNTER — Encounter (HOSPITAL_COMMUNITY): Payer: Self-pay

## 2020-08-16 ENCOUNTER — Other Ambulatory Visit: Payer: Self-pay

## 2020-08-16 DIAGNOSIS — M6281 Muscle weakness (generalized): Secondary | ICD-10-CM | POA: Diagnosis not present

## 2020-08-16 DIAGNOSIS — R2681 Unsteadiness on feet: Secondary | ICD-10-CM

## 2020-08-16 DIAGNOSIS — R262 Difficulty in walking, not elsewhere classified: Secondary | ICD-10-CM

## 2020-08-16 NOTE — Therapy (Signed)
[facility] [place], [place], [address] Phone: [phone]   Fax:  [phone]  Physical Therapy Treatment  Patient Details  Name: Michelle Holt MRN: [id number] Date of Birth: 05/02/1970 Referring Provider (PT): Erlinda Hong   Encounter Date: 08/16/2020   PT End of Session - 08/16/20 1058     Visit Number 2    Number of Visits 12    Date for PT Re-Evaluation 09/25/20    Authorization Type [place] Medicaid [employer], auth required    Progress Note Due on Visit 10    PT Start Time 1033    PT Stop Time 1115    PT Time Calculation (min) 42 min    Equipment Utilized During Treatment Gait belt    Activity Tolerance Patient tolerated treatment well    Behavior During Therapy WFL for tasks assessed/performed             Past Medical History:  Diagnosis Date   Anemia    Anxiety and depression    Arthritis of facet joints at multiple vertebral levels    L5-S1   Constipation    Dyslipidemia    Family history of breast cancer    Family history of uterine cancer    GERD (gastroesophageal reflux disease)    History of hiatal hernia    History of kidney stones    Insomnia    Irritable bowel syndrome    Migraine    Muscle tension headache    Neuropathy of finger    Ovarian carcinoma ([place])    ovarian   Plantar fasciitis of right foot    Port-A-Cath in place 08/20/2018    Past Surgical History:  Procedure Laterality Date   ABDOMINAL HYSTERECTOMY     CHOLECYSTECTOMY  2008   COLONOSCOPY N/A 08/13/2013   Procedure: COLONOSCOPY;  Surgeon: Rogene Houston, MD;  [facility];  Service: Endoscopy;  Laterality: N/A;  230-moved to 145 Ann to notify pt   ESOPHAGOGASTRODUODENOSCOPY     ESOPHAGOGASTRODUODENOSCOPY (EGD) WITH PROPOFOL N/A 07/20/2018   Procedure: ESOPHAGOGASTRODUODENOSCOPY (EGD) WITH PROPOFOL;  Surgeon: Rogene Houston, MD;  [facility];  Service: Endoscopy;  Laterality: N/A;  Possible esophageal  dilation.   IR ANGIOGRAM SELECTIVE EACH ADDITIONAL VESSEL  08/01/2018   IR ANGIOGRAM VISCERAL SELECTIVE  08/01/2018   IR EMBO ART  VEN HEMORR LYMPH EXTRAV  INC GUIDE ROADMAPPING  08/01/2018   IR IMAGING GUIDED PORT INSERTION  08/20/2018   IR PERC PLEURAL DRAIN W/INDWELL CATH W/IMG GUIDE  07/08/2018   IR THORACENTESIS ASP PLEURAL SPACE W/IMG GUIDE  07/07/2018   IR US GUIDE VASC ACCESS RIGHT  08/01/2018   PLEURAL EFFUSION DRAINAGE Left 07/13/2018   Procedure: DRAINAGE OF LOCULATED PLEURAL EFFUSION;  Surgeon: Ivin Poot, MD;  Location: [facility];  Service: Thoracic;  Laterality: Left;   REMOVAL OF PLEURAL DRAINAGE CATHETER Left 08/20/2018   Procedure: REMOVAL OF PLEURAL DRAINAGE CATHETER;  Surgeon: Ivin Poot, MD;  Location: [facility];  Service: Thoracic;  Laterality: Left;   REMOVAL OF PLEURAL DRAINAGE CATHETER Left 08/20/2018   Procedure: REMOVAL OF PLEURAL DRAINAGE CATHETER;  Surgeon: Ivin Poot, MD;  Location: [facility];  Service: Thoracic;  Laterality: Left;   TALC PLEURODESIS Left 07/13/2018   Procedure: Talc Pleuradesis;  Surgeon: Prescott Gum, Collier Salina, MD;  Location: [facility];  Service: Thoracic;  Laterality: Left;   TOTAL HIP ARTHROPLASTY Left 06/05/2020   Procedure: LEFT TOTAL HIP ARTHROPLASTY ANTERIOR APPROACH;  Surgeon:  Leandrew Koyanagi, MD;  Location: [facility];  Service: Orthopedics;  Laterality: Left;  3-C   TUBAL LIGATION Bilateral    UTERINE ABLATION     VIDEO ASSISTED THORACOSCOPY Left 07/13/2018   Procedure: VIDEO ASSISTED THORACOSCOPY;  Surgeon: Ivin Poot, MD;  Location: [facility];  Service: Thoracic;  Laterality: Left;    There were no vitals filed for this visit.   Subjective Assessment - 08/16/20 1055     Subjective Returns to therapy with increased pain in left buttock (indicates spot at PSIS and sciatic notch) which she attributes to her HEP initiation    How long can you walk comfortably? 5-10 minutes    Patient Stated Goals "Get my legs back"    Currently in Pain? Yes    Pain Score  6     Pain Location Buttocks    Pain Orientation Left    Pain Descriptors / Indicators Sharp    Pain Type Acute pain                [facility] PT Assessment - 08/16/20 0001       Assessment   Medical Diagnosis Lt THR, anterior approach    Referring Provider (PT) Xu    Next MD Visit 09/05/20                           [facility] Adult PT Treatment/Exercise - 08/16/20 0001       Knee/Hip Exercises: Supine   Quad Sets Strengthening;Left;3 sets;10 reps    Hip Adduction Isometric Strengthening;Both;3 sets;10 reps    Other Supine Knee/Hip Exercises clamshell isometric 3x10      Knee/Hip Exercises: Prone   Other Prone Exercises prone heel squeeze 3x10 2 sec hold      Manual Therapy   Manual Therapy Soft tissue mobilization    Manual therapy comments completed separate from all other interventions    Soft tissue mobilization percussion massage to left glute/piriformis coupled with contract-relax                      PT Short Term Goals - 08/14/20 1153       PT SHORT TERM GOAL #1   Title Patient will be independent with HEP in order to improve functional outcomes.    Time 3    Period Weeks    Status New    Target Date 09/04/20      PT SHORT TERM GOAL #2   Title Patient will report at least 25% improvement in symptoms for improved quality of life.    Time 3    Period Weeks    Status New    Target Date 09/04/20      PT SHORT TERM GOAL #3   Title Manifest improved BLE strength and balance as evidenced by time of 14 sec 5xSTS    Baseline 19 sec    Time 3    Period Weeks    Status New    Target Date 09/04/20               PT Long Term Goals - 08/14/20 1154       PT LONG TERM GOAL #1   Title Demonstrate improved gait velocity as evidenced by distance of 350 ft during 2MWT    Baseline 250 ft    Time 6    Period Weeks    Status New    Target Date 09/25/20  PT LONG TERM GOAL #2   Title Demo left hip abduction strength 4/5 to  improve gait stability    Baseline 3-/5 left hip abduction    Time 6    Period Weeks    Status New    Target Date 09/25/20                   Plan - 08/16/20 1110     Clinical Impression Statement Decrease in sharp pain in left buttocks by end of session via way of gentle glute isometrics. Pt education on posture/positioning to minimize pain/dysfunction to area of PSIS and piriformis. Continued sessions indicated to improve LLE strength and normalize gait pattern to increase functional activity tolerance.    Personal Factors and Comorbidities Past/Current Experience    Examination-Activity Limitations Carry;Lift;Stand;Stairs;Squat;Locomotion Level;Transfers;Bed Mobility;Bend    Examination-Participation Restrictions Cleaning;Community Activity;Occupation;Meal Prep;Shop    Stability/Clinical Decision Making Stable/Uncomplicated    Rehab Potential Excellent    PT Frequency 2x / week    PT Duration 6 weeks    PT Treatment/Interventions ADLs/Self Care Home Management;Aquatic Therapy;Electrical Stimulation;DME Instruction;Gait training;Stair training;Functional mobility training;Therapeutic activities;Therapeutic exercise;Balance training;Patient/family education;Neuromuscular re-education;Manual techniques;Taping;Dry needling;Spinal Manipulations;Joint Manipulations    PT Next Visit Plan progress HEP to dynamic exercises    PT Home Exercise Plan LE isometrics    Consulted and Agree with Plan of Care Patient             Patient will benefit from skilled therapeutic intervention in order to improve the following deficits and impairments:  Abnormal gait, Decreased activity tolerance, Decreased balance, Decreased mobility, Decreased endurance, Decreased strength, Difficulty walking, Improper body mechanics, Pain  Visit Diagnosis: Muscle weakness (generalized)  Difficulty in walking, not elsewhere classified  Unsteadiness on feet     Problem List Patient Active Problem List    Diagnosis Date Noted   Post-op pain 07/12/2020   Splenic lesion 07/12/2020   Status post total replacement of left hip 06/05/2020   Primary osteoarthritis of left hip 02/09/2020   Pain in right knee 02/09/2020   Palliative care patient 09/18/2018   Port-A-Cath in place 08/20/2018   Genetic testing 08/20/2018   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[place]) 08/17/2018   Splenic laceration 07/31/2018   Family history of uterine cancer    Family history of breast cancer    Symptomatic anemia 07/18/2018   Upper GI bleeding 07/18/2018   Ovarian cancer, bilateral 07/18/2018   HCAP (healthcare-associated pneumonia) 07/18/2018   Pleural effusion 07/07/2018   Migraine 07/06/2018   Pleural effusion on left 07/06/2018   Tobacco abuse 07/06/2018   Generalized anxiety disorder 05/02/2014   Unspecified constipation 08/10/2013   Rectal bleeding 08/10/2013   Lumbosacral spondylosis without myelopathy 10/23/2011   Intractable migraine without aura 10/23/2011   11:15 AM, 08/16/20 M. Sherlyn Lees, PT, DPT Physical Therapist- [facility] Office Number: [phone]   [facility] [address] [place], [place], [address] Phone: [phone]   Fax:  [phone]  Name: Michelle Holt MRN: [id number] Date of Birth: [birth year]

## 2020-08-21 ENCOUNTER — Other Ambulatory Visit (HOSPITAL_COMMUNITY): Payer: Self-pay

## 2020-08-21 DIAGNOSIS — C563 Malignant neoplasm of bilateral ovaries: Secondary | ICD-10-CM

## 2020-08-21 MED ORDER — OXYCODONE HCL 10 MG PO TABS: 10.0000 mg | ORAL_TABLET | ORAL | 0 refills | Status: DC | PRN

## 2020-08-22 ENCOUNTER — Ambulatory Visit (HOSPITAL_COMMUNITY): Payer: Medicaid Other | Admitting: Physical Therapy

## 2020-08-24 ENCOUNTER — Ambulatory Visit (HOSPITAL_COMMUNITY): Payer: Medicaid Other

## 2020-08-28 ENCOUNTER — Encounter (HOSPITAL_COMMUNITY): Payer: Medicaid Other

## 2020-08-30 ENCOUNTER — Ambulatory Visit (HOSPITAL_COMMUNITY): Payer: Medicaid Other

## 2020-09-04 ENCOUNTER — Ambulatory Visit (HOSPITAL_COMMUNITY): Payer: Medicaid Other

## 2020-09-05 ENCOUNTER — Ambulatory Visit: Payer: Medicaid Other | Admitting: Orthopaedic Surgery

## 2020-09-06 ENCOUNTER — Encounter (HOSPITAL_COMMUNITY): Payer: Medicaid Other | Admitting: Physical Therapy

## 2020-09-11 ENCOUNTER — Ambulatory Visit (HOSPITAL_COMMUNITY): Payer: Medicaid Other | Admitting: Physical Therapy

## 2020-09-12 ENCOUNTER — Ambulatory Visit: Payer: Medicaid Other | Admitting: Orthopaedic Surgery

## 2020-09-13 ENCOUNTER — Ambulatory Visit (HOSPITAL_COMMUNITY): Payer: Medicaid Other | Admitting: Physical Therapy

## 2020-09-13 ENCOUNTER — Telehealth (HOSPITAL_COMMUNITY): Payer: Self-pay | Admitting: Physical Therapy

## 2020-09-13 NOTE — Telephone Encounter (Signed)
Lack of tranpsortation - having car trouble and r/s for Friday with CR

## 2020-09-15 ENCOUNTER — Other Ambulatory Visit: Payer: Self-pay

## 2020-09-15 ENCOUNTER — Encounter (HOSPITAL_COMMUNITY): Payer: Self-pay | Admitting: Hematology

## 2020-09-15 ENCOUNTER — Ambulatory Visit (HOSPITAL_COMMUNITY): Payer: Medicare Other | Attending: Physician Assistant | Admitting: Physical Therapy

## 2020-09-15 ENCOUNTER — Encounter (HOSPITAL_COMMUNITY): Payer: Self-pay | Admitting: Physical Therapy

## 2020-09-15 DIAGNOSIS — R2681 Unsteadiness on feet: Secondary | ICD-10-CM

## 2020-09-15 DIAGNOSIS — M6281 Muscle weakness (generalized): Secondary | ICD-10-CM

## 2020-09-15 DIAGNOSIS — R262 Difficulty in walking, not elsewhere classified: Secondary | ICD-10-CM | POA: Diagnosis present

## 2020-09-15 NOTE — Therapy (Signed)
[facility] [place], [place], [address] Phone: [phone]   Fax:  [phone]  Physical Therapy Treatment  Patient Details  Name: Michelle Holt MRN: [id number] Date of Birth: 1970/04/17 Referring Provider (PT): Erlinda Hong   Encounter Date: 09/15/2020   PT End of Session - 09/15/20 0845     Visit Number 3    Number of Visits 12    Date for PT Re-Evaluation 09/25/20    Authorization Type [place] Medicaid [employer], auth required    Authorization - Visit Number 3    Authorization - Number of Visits 12    Progress Note Due on Visit 10    PT Start Time 0830    PT Stop Time 0910    PT Time Calculation (min) 40 min    Activity Tolerance Patient tolerated treatment well    Behavior During Therapy [facility] for tasks assessed/performed             Past Medical History:  Diagnosis Date   Anemia    Anxiety and depression    Arthritis of facet joints at multiple vertebral levels    L5-S1   Constipation    Dyslipidemia    Family history of breast cancer    Family history of uterine cancer    GERD (gastroesophageal reflux disease)    History of hiatal hernia    History of kidney stones    Insomnia    Irritable bowel syndrome    Migraine    Muscle tension headache    Neuropathy of finger    Ovarian carcinoma ([place])    ovarian   Plantar fasciitis of right foot    Port-A-Cath in place 08/20/2018    Past Surgical History:  Procedure Laterality Date   ABDOMINAL HYSTERECTOMY     CHOLECYSTECTOMY  2008   COLONOSCOPY N/A 08/13/2013   Procedure: COLONOSCOPY;  Surgeon: Rogene Houston, MD;  [facility];  Service: Endoscopy;  Laterality: N/A;  230-moved to 145 Ann to notify pt   ESOPHAGOGASTRODUODENOSCOPY     ESOPHAGOGASTRODUODENOSCOPY (EGD) WITH PROPOFOL N/A 07/20/2018   Procedure: ESOPHAGOGASTRODUODENOSCOPY (EGD) WITH PROPOFOL;  Surgeon: Rogene Houston, MD;  [facility];  Service: Endoscopy;  Laterality: N/A;   Possible esophageal dilation.   IR ANGIOGRAM SELECTIVE EACH ADDITIONAL VESSEL  08/01/2018   IR ANGIOGRAM VISCERAL SELECTIVE  08/01/2018   IR EMBO ART  VEN HEMORR LYMPH EXTRAV  INC GUIDE ROADMAPPING  08/01/2018   IR IMAGING GUIDED PORT INSERTION  08/20/2018   IR PERC PLEURAL DRAIN W/INDWELL CATH W/IMG GUIDE  07/08/2018   IR THORACENTESIS ASP PLEURAL SPACE W/IMG GUIDE  07/07/2018   IR US GUIDE VASC ACCESS RIGHT  08/01/2018   PLEURAL EFFUSION DRAINAGE Left 07/13/2018   Procedure: DRAINAGE OF LOCULATED PLEURAL EFFUSION;  Surgeon: Ivin Poot, MD;  Location: [facility];  Service: Thoracic;  Laterality: Left;   REMOVAL OF PLEURAL DRAINAGE CATHETER Left 08/20/2018   Procedure: REMOVAL OF PLEURAL DRAINAGE CATHETER;  Surgeon: Ivin Poot, MD;  Location: [facility] [place];  Service: Thoracic;  Laterality: Left;   REMOVAL OF PLEURAL DRAINAGE CATHETER Left 08/20/2018   Procedure: REMOVAL OF PLEURAL DRAINAGE CATHETER;  Surgeon: Ivin Poot, MD;  Location: [facility];  Service: Thoracic;  Laterality: Left;   TALC PLEURODESIS Left 07/13/2018   Procedure: Talc Pleuradesis;  Surgeon: Prescott Gum, Collier Salina, MD;  Location: [place];  Service: Thoracic;  Laterality: Left;   TOTAL HIP ARTHROPLASTY Left 06/05/2020   Procedure:  LEFT TOTAL HIP ARTHROPLASTY ANTERIOR APPROACH;  Surgeon: Leandrew Koyanagi, MD;  Location: [place];  Service: Orthopedics;  Laterality: Left;  3-C   TUBAL LIGATION Bilateral    UTERINE ABLATION     VIDEO ASSISTED THORACOSCOPY Left 07/13/2018   Procedure: VIDEO ASSISTED THORACOSCOPY;  Surgeon: Ivin Poot, MD;  Location: [facility];  Service: Thoracic;  Laterality: Left;    There were no vitals filed for this visit.   Subjective Assessment - 09/15/20 0831     Subjective Pt has not been to therapy due to her son having to have emergency surgery.  She is doing her exercises.    How long can you walk comfortably? 5-10 minutes    Patient Stated Goals "Get my legs back"    Currently in Pain? Yes    Pain Score 3     Pain  Location Buttocks    Pain Orientation Left    Pain Descriptors / Indicators Sharp    Pain Type Acute pain    Pain Onset More than a month ago    Pain Frequency Constant    Aggravating Factors  if she turns the wrong way    Pain Relieving Factors sitting    Effect of Pain on Daily Activities limits                               [facility] Adult PT Treatment/Exercise - 09/15/20 0001       Exercises   Exercises Knee/Hip      Knee/Hip Exercises: Standing   Heel Raises Both;10 reps    Hip Abduction Stengthening;Left;10 reps    Abduction Limitations 3#    Hip Extension Stengthening;Left;10 reps    Extension Limitations 3#    Forward Step Up Left;10 reps;Hand Hold: 2;Step Height: 4"    Functional Squat 10 reps    SLS B x 3; RT:  10'; Lt  6"    Other Standing Knee Exercises marching hold x 3 seconds one hand x 10 ; side step x 2 RT      Knee/Hip Exercises: Supine   Bridges 10 reps      Knee/Hip Exercises: Sidelying   Hip ABduction Left;10 reps                      PT Short Term Goals - 09/15/20 0853       PT SHORT TERM GOAL #1   Title Patient will be independent with HEP in order to improve functional outcomes.    Time 3    Period Weeks    Status On-going    Target Date 09/04/20      PT SHORT TERM GOAL #2   Title Patient will report at least 25% improvement in symptoms for improved quality of life.    Time 3    Period Weeks    Status On-going    Target Date 09/04/20      PT SHORT TERM GOAL #3   Title Manifest improved BLE strength and balance as evidenced by time of 14 sec 5xSTS    Baseline 19 sec    Time 3    Period Weeks    Status On-going    Target Date 09/04/20               PT Long Term Goals - 09/15/20 0854       PT LONG TERM GOAL #1   Status On-going  PT LONG TERM GOAL #2   Status On-going                   Plan - 09/15/20 0845     Clinical Impression Statement Pt has not been back for a month due  to her son being in an accident.  Continues to have weakness in Lt LE and buttock pain.  Pt HEP updated and exercises progressed to WB status.    Personal Factors and Comorbidities Past/Current Experience    Examination-Activity Limitations Carry;Lift;Stand;Stairs;Squat;Locomotion Level;Transfers;Bed Mobility;Bend    Examination-Participation Restrictions Cleaning;Community Activity;Occupation;Meal Prep;Shop    Stability/Clinical Decision Making Stable/Uncomplicated    Rehab Potential Excellent    PT Frequency 2x / week    PT Duration 6 weeks    PT Treatment/Interventions ADLs/Self Care Home Management;Aquatic Therapy;Electrical Stimulation;DME Instruction;Gait training;Stair training;Functional mobility training;Therapeutic activities;Therapeutic exercise;Balance training;Patient/family education;Neuromuscular re-education;Manual techniques;Taping;Dry needling;Spinal Manipulations;Joint Manipulations    PT Next Visit Plan progress HEP to dynamic exercises    PT Home Exercise Plan LE isometrics; 9/2:  heel raises, squats, bridge and sidelying abduction.    Consulted and Agree with Plan of Care Patient             Patient will benefit from skilled therapeutic intervention in order to improve the following deficits and impairments:  Abnormal gait, Decreased activity tolerance, Decreased balance, Decreased mobility, Decreased endurance, Decreased strength, Difficulty walking, Improper body mechanics, Pain  Visit Diagnosis: Muscle weakness (generalized)  Difficulty in walking, not elsewhere classified  Unsteadiness on feet     Problem List Patient Active Problem List   Diagnosis Date Noted   Post-op pain 07/12/2020   Splenic lesion 07/12/2020   Status post total replacement of left hip 06/05/2020   Primary osteoarthritis of left hip 02/09/2020   Pain in right knee 02/09/2020   Palliative care patient 09/18/2018   Port-A-Cath in place 08/20/2018   Genetic testing 08/20/2018    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[place]) 08/17/2018   Splenic laceration 07/31/2018   Family history of uterine cancer    Family history of breast cancer    Symptomatic anemia 07/18/2018   Upper GI bleeding 07/18/2018   Ovarian cancer, bilateral 07/18/2018   HCAP (healthcare-associated pneumonia) 07/18/2018   Pleural effusion 07/07/2018   Migraine 07/06/2018   Pleural effusion on left 07/06/2018   Tobacco abuse 07/06/2018   Generalized anxiety disorder 05/02/2014   Unspecified constipation 08/10/2013   Rectal bleeding 08/10/2013   Lumbosacral spondylosis without myelopathy 10/23/2011   Intractable migraine without aura 10/23/2011    Rayetta Humphrey, PT CLT [phone]  09/15/2020, 9:15 AM  [facility] [address] [place], [place], [address] Phone: [phone]   Fax:  [phone]  Name: Michelle Holt MRN: [id number] Date of Birth: 08-31-1970

## 2020-09-19 ENCOUNTER — Other Ambulatory Visit (HOSPITAL_COMMUNITY): Payer: Self-pay

## 2020-09-19 ENCOUNTER — Ambulatory Visit (INDEPENDENT_AMBULATORY_CARE_PROVIDER_SITE_OTHER): Payer: Medicare Other | Admitting: Orthopaedic Surgery

## 2020-09-19 ENCOUNTER — Encounter: Payer: Self-pay | Admitting: Orthopaedic Surgery

## 2020-09-19 ENCOUNTER — Other Ambulatory Visit: Payer: Self-pay

## 2020-09-19 ENCOUNTER — Other Ambulatory Visit (HOSPITAL_COMMUNITY): Payer: Self-pay | Admitting: *Deleted

## 2020-09-19 DIAGNOSIS — Z96642 Presence of left artificial hip joint: Secondary | ICD-10-CM

## 2020-09-19 DIAGNOSIS — C563 Malignant neoplasm of bilateral ovaries: Secondary | ICD-10-CM

## 2020-09-19 MED ORDER — OXYCODONE HCL 10 MG PO TABS: 10.0000 mg | ORAL_TABLET | ORAL | 0 refills | Status: DC | PRN

## 2020-09-19 MED ORDER — TIZANIDINE HCL 4 MG PO TABS: 4.0000 mg | ORAL_TABLET | Freq: Three times a day (TID) | ORAL | 0 refills | Status: DC | PRN

## 2020-09-19 NOTE — Progress Notes (Signed)
Post-Op Visit Note   Patient: Michelle Holt           Date of Birth: [birth year]           MRN: [id number] Visit Date: 09/19/2020 PCP: [name], MD   Assessment & Plan:  Chief Complaint:  Chief Complaint  Patient presents with   Left Hip - Follow-up    06/05/2020 Left THA   Visit Diagnoses:  1. Status post total replacement of left hip     Plan: Shannin is status post left total hip replacement on 06/05/2020.  Her main complaint is that she feels residual weakness in her hip muscles.  She does feel a benefit to going to physical therapy.  Requesting refill of Zanaflex to help with nighttime pain.  Left hip scar is fully healed.  No signs of infection.  No significant tenderness.  She is walking better today and her hip flexors are improving in terms of strength.  Daliah will continue to work on strengthening through physical therapy.  Zanaflex refilled.  Dental prophylaxis..  Recheck in 3 months with standing AP pelvis x-rays.  Follow-Up Instructions: Return in about 3 months (around 12/19/2020).   Orders:  No orders of the defined types were placed in this encounter.  Meds ordered this encounter  Medications   tiZANidine (ZANAFLEX) 4 MG tablet    Sig: Take 1 tablet (4 mg total) by mouth 3 (three) times daily as needed for muscle spasms.    Dispense:  60 tablet    Refill:  0    Imaging: No results found.  PMFS History: Patient Active Problem List   Diagnosis Date Noted   Post-op pain 07/12/2020   Splenic lesion 07/12/2020   Status post total replacement of left hip 06/05/2020   Primary osteoarthritis of left hip 02/09/2020   Pain in right knee 02/09/2020   Palliative care patient 09/18/2018   Port-A-Cath in place 08/20/2018   Genetic testing 08/20/2018   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[place]) 08/17/2018   Splenic laceration 07/31/2018   Family history of uterine cancer    Family history of breast cancer    Symptomatic anemia 07/18/2018   Upper  GI bleeding 07/18/2018   Ovarian cancer, bilateral 07/18/2018   HCAP (healthcare-associated pneumonia) 07/18/2018   Pleural effusion 07/07/2018   Migraine 07/06/2018   Pleural effusion on left 07/06/2018   Tobacco abuse 07/06/2018   Generalized anxiety disorder 05/02/2014   Unspecified constipation 08/10/2013   Rectal bleeding 08/10/2013   Lumbosacral spondylosis without myelopathy 10/23/2011   Intractable migraine without aura 10/23/2011   Past Medical History:  Diagnosis Date   Anemia    Anxiety and depression    Arthritis of facet joints at multiple vertebral levels    L5-S1   Constipation    Dyslipidemia    Family history of breast cancer    Family history of uterine cancer    GERD (gastroesophageal reflux disease)    History of hiatal hernia    History of kidney stones    Insomnia    Irritable bowel syndrome    Migraine    Muscle tension headache    Neuropathy of finger    Ovarian carcinoma ([facility])    ovarian   Plantar fasciitis of right foot    Port-A-Cath in place 08/20/2018    Family History  Problem Relation Age of Onset   Hypertension Mother    Obesity Mother    Diabetes Mother    Kidney disease Mother  Peripheral vascular disease Father    Atrial fibrillation Father    COPD Brother    Osteoporosis Brother    Crohn's disease Sister    Uterine cancer Sister 21       maternal half sister   Breast cancer Maternal Aunt 71   Colon cancer Neg Hx     Past Surgical History:  Procedure Laterality Date   ABDOMINAL HYSTERECTOMY     CHOLECYSTECTOMY  2008   COLONOSCOPY N/A 08/13/2013   Procedure: COLONOSCOPY;  Surgeon: Rogene Houston, MD;  [facility];  Service: Endoscopy;  Laterality: N/A;  230-moved to 145 Ann to notify pt   ESOPHAGOGASTRODUODENOSCOPY     ESOPHAGOGASTRODUODENOSCOPY (EGD) WITH PROPOFOL N/A 07/20/2018   Procedure: ESOPHAGOGASTRODUODENOSCOPY (EGD) WITH PROPOFOL;  Surgeon: Rogene Houston, MD;  [facility];  Service:  Endoscopy;  Laterality: N/A;  Possible esophageal dilation.   IR ANGIOGRAM SELECTIVE EACH ADDITIONAL VESSEL  08/01/2018   IR ANGIOGRAM VISCERAL SELECTIVE  08/01/2018   IR EMBO ART  VEN HEMORR LYMPH EXTRAV  INC GUIDE ROADMAPPING  08/01/2018   IR IMAGING GUIDED PORT INSERTION  08/20/2018   IR PERC PLEURAL DRAIN W/INDWELL CATH W/IMG GUIDE  07/08/2018   IR THORACENTESIS ASP PLEURAL SPACE W/IMG GUIDE  07/07/2018   IR US GUIDE VASC ACCESS RIGHT  08/01/2018   PLEURAL EFFUSION DRAINAGE Left 07/13/2018   Procedure: DRAINAGE OF LOCULATED PLEURAL EFFUSION;  Surgeon: Ivin Poot, MD;  Location: [place];  Service: Thoracic;  Laterality: Left;   REMOVAL OF PLEURAL DRAINAGE CATHETER Left 08/20/2018   Procedure: REMOVAL OF PLEURAL DRAINAGE CATHETER;  Surgeon: Ivin Poot, MD;  Location: [place];  Service: Thoracic;  Laterality: Left;   REMOVAL OF PLEURAL DRAINAGE CATHETER Left 08/20/2018   Procedure: REMOVAL OF PLEURAL DRAINAGE CATHETER;  Surgeon: Ivin Poot, MD;  Location: [place];  Service: Thoracic;  Laterality: Left;   TALC PLEURODESIS Left 07/13/2018   Procedure: Talc Pleuradesis;  Surgeon: Prescott Gum, Collier Salina, MD;  Location: [place];  Service: Thoracic;  Laterality: Left;   TOTAL HIP ARTHROPLASTY Left 06/05/2020   Procedure: LEFT TOTAL HIP ARTHROPLASTY ANTERIOR APPROACH;  Surgeon: Leandrew Koyanagi, MD;  Location: [place];  Service: Orthopedics;  Laterality: Left;  3-C   TUBAL LIGATION Bilateral    UTERINE ABLATION     VIDEO ASSISTED THORACOSCOPY Left 07/13/2018   Procedure: VIDEO ASSISTED THORACOSCOPY;  Surgeon: Ivin Poot, MD;  Location: [facility];  Service: Thoracic;  Laterality: Left;   Social History   Occupational History    Employer: [employer]  Tobacco Use   Smoking status: Former    Packs/day: 0.50    Years: 17.00    Pack years: 8.50    Types: Cigarettes    Quit date: 06/22/2018    Years since quitting: 2.2   Smokeless tobacco: Never  Vaping Use   Vaping Use: Never used  Substance and Sexual Activity    Alcohol use: Yes    Alcohol/week: 1.0 standard drink    Types: 1 Glasses of wine per week    Comment: Drinks alcohol on occasion   Drug use: No   Sexual activity: Not on file

## 2020-09-20 ENCOUNTER — Encounter (HOSPITAL_COMMUNITY): Payer: Medicaid Other | Admitting: Physical Therapy

## 2020-09-22 ENCOUNTER — Encounter (HOSPITAL_COMMUNITY): Payer: Self-pay | Admitting: Hematology

## 2020-09-22 ENCOUNTER — Ambulatory Visit (HOSPITAL_COMMUNITY): Payer: Medicare Other | Admitting: Physical Therapy

## 2020-09-22 ENCOUNTER — Other Ambulatory Visit: Payer: Self-pay

## 2020-09-22 DIAGNOSIS — M6281 Muscle weakness (generalized): Secondary | ICD-10-CM | POA: Diagnosis not present

## 2020-09-22 DIAGNOSIS — R2681 Unsteadiness on feet: Secondary | ICD-10-CM

## 2020-09-22 DIAGNOSIS — R262 Difficulty in walking, not elsewhere classified: Secondary | ICD-10-CM

## 2020-09-22 NOTE — Therapy (Signed)
[place], [place], [address] Phone: [phone]   Fax:  [phone]  Physical Therapy Treatment  Patient Details  Name: Michelle Holt MRN: [id number] Date of Birth: Feb 16, 1970 Referring Provider (PT): Erlinda Hong   Encounter Date: 09/22/2020   PT End of Session - 09/22/20 1245     Visit Number 4    Number of Visits 12    Date for PT Re-Evaluation 09/25/20    Authorization Type [place] [employer], auth required    Authorization - Visit Number 4    Authorization - Number of Visits 12    Progress Note Due on Visit 10    PT Start Time 1134    PT Stop Time 1219    PT Time Calculation (min) 45 min    Activity Tolerance Patient tolerated treatment well    Behavior During Therapy [facility] for tasks assessed/performed             Past Medical History:  Diagnosis Date   Anemia    Anxiety and depression    Arthritis of facet joints at multiple vertebral levels    L5-S1   Constipation    Dyslipidemia    Family history of breast cancer    Family history of uterine cancer    GERD (gastroesophageal reflux disease)    History of hiatal hernia    History of kidney stones    Insomnia    Irritable bowel syndrome    Migraine    Muscle tension headache    Neuropathy of finger    Ovarian carcinoma ([place])    ovarian   Plantar fasciitis of right foot    Port-A-Cath in place 08/20/2018    Past Surgical History:  Procedure Laterality Date   ABDOMINAL HYSTERECTOMY     CHOLECYSTECTOMY  2008   COLONOSCOPY N/A 08/13/2013   Procedure: COLONOSCOPY;  Surgeon: Rogene Houston, MD;  [facility];  Service: Endoscopy;  Laterality: N/A;  230-moved to 145 Ann to notify pt   ESOPHAGOGASTRODUODENOSCOPY     ESOPHAGOGASTRODUODENOSCOPY (EGD) WITH PROPOFOL N/A 07/20/2018   Procedure: ESOPHAGOGASTRODUODENOSCOPY (EGD) WITH PROPOFOL;  Surgeon: Rogene Houston, MD;  [facility];  Service: Endoscopy;  Laterality: N/A;   Possible esophageal dilation.   IR ANGIOGRAM SELECTIVE EACH ADDITIONAL VESSEL  08/01/2018   IR ANGIOGRAM VISCERAL SELECTIVE  08/01/2018   IR EMBO ART  VEN HEMORR LYMPH EXTRAV  INC GUIDE ROADMAPPING  08/01/2018   IR IMAGING GUIDED PORT INSERTION  08/20/2018   IR PERC PLEURAL DRAIN W/INDWELL CATH W/IMG GUIDE  07/08/2018   IR THORACENTESIS ASP PLEURAL SPACE W/IMG GUIDE  07/07/2018   IR US GUIDE VASC ACCESS RIGHT  08/01/2018   PLEURAL EFFUSION DRAINAGE Left 07/13/2018   Procedure: DRAINAGE OF LOCULATED PLEURAL EFFUSION;  Surgeon: Ivin Poot, MD;  Location: [place];  Service: Thoracic;  Laterality: Left;   REMOVAL OF PLEURAL DRAINAGE CATHETER Left 08/20/2018   Procedure: REMOVAL OF PLEURAL DRAINAGE CATHETER;  Surgeon: Ivin Poot, MD;  Location: [place];  Service: Thoracic;  Laterality: Left;   REMOVAL OF PLEURAL DRAINAGE CATHETER Left 08/20/2018   Procedure: REMOVAL OF PLEURAL DRAINAGE CATHETER;  Surgeon: Ivin Poot, MD;  Location: [place];  Service: Thoracic;  Laterality: Left;   TALC PLEURODESIS Left 07/13/2018   Procedure: Talc Pleuradesis;  Surgeon: Prescott Gum, Collier Salina, MD;  Location: [place];  Service: Thoracic;  Laterality: Left;   TOTAL HIP ARTHROPLASTY Left 06/05/2020   Procedure:  LEFT TOTAL HIP ARTHROPLASTY ANTERIOR APPROACH;  Surgeon: Leandrew Koyanagi, MD;  Location: [place];  Service: Orthopedics;  Laterality: Left;  3-C   TUBAL LIGATION Bilateral    UTERINE ABLATION     VIDEO ASSISTED THORACOSCOPY Left 07/13/2018   Procedure: VIDEO ASSISTED THORACOSCOPY;  Surgeon: Ivin Poot, MD;  Location: [place];  Service: Thoracic;  Laterality: Left;    There were no vitals filed for this visit.   Subjective Assessment - 09/22/20 1135     Subjective pt reports no pain just soreness in her hip.    Currently in Pain? Yes    Pain Score 0-No pain                               [facility] Adult PT Treatment/Exercise - 09/22/20 0001       Knee/Hip Exercises: Standing   Heel Raises 20  reps    Hip Abduction Stengthening;Left;10 reps;2 sets    Abduction Limitations 3#    Hip Extension Stengthening;Left;10 reps;2 sets    Extension Limitations 3#    Lateral Step Up Left;10 reps;Hand Hold: 1;Step Height: 4"    Forward Step Up Left;10 reps;Hand Hold: 2;Step Height: 4"    Step Down Left;10 reps;Hand Hold: 1;Step Height: 4"    Functional Squat 15 reps    SLS with Vectors 5X5" holds with 1 HHA on Lt LE    Other Standing Knee Exercises marching hold x 3 seconds one hand x 10 ; side step with squat x 2 RT                       PT Short Term Goals - 09/15/20 [id number]       PT SHORT TERM GOAL #1   Title Patient will be independent with HEP in order to improve functional outcomes.    Time 3    Period Weeks    Status On-going    Target Date 09/04/20      PT SHORT TERM GOAL #2   Title Patient will report at least 25% improvement in symptoms for improved quality of life.    Time 3    Period Weeks    Status On-going    Target Date 09/04/20      PT SHORT TERM GOAL #3   Title Manifest improved BLE strength and balance as evidenced by time of 14 sec 5xSTS    Baseline 19 sec    Time 3    Period Weeks    Status On-going    Target Date 09/04/20               PT Long Term Goals - 09/15/20 0854       PT LONG TERM GOAL #1   Status On-going      PT LONG TERM GOAL #2   Status On-going                   Plan - 09/22/20 1246     Clinical Impression Statement Pt with only soreness today, no real pain in hip.  Continued with exercise progression adding additional set to hip exercises.  Began lateral step ups as well as step downs to work on eccentric strengthening.  Vectors added with noted challenge for Lt gluteal mm.  Pt without any pain or need for rest break during session today.    Personal Factors and Comorbidities Past/Current Experience  Examination-Activity Limitations Carry;Lift;Stand;Stairs;Squat;Locomotion Level;Transfers;Bed  Mobility;Bend    Examination-Participation Restrictions Cleaning;Community Activity;Occupation;Meal Prep;Shop    Stability/Clinical Decision Making Stable/Uncomplicated    Rehab Potential Excellent    PT Frequency 2x / week    PT Duration 6 weeks    PT Treatment/Interventions ADLs/Self Care Home Management;Aquatic Therapy;Electrical Stimulation;DME Instruction;Gait training;Stair training;Functional mobility training;Therapeutic activities;Therapeutic exercise;Balance training;Patient/family education;Neuromuscular re-education;Manual techniques;Taping;Dry needling;Spinal Manipulations;Joint Manipulations    PT Next Visit Plan progress HEP to dynamic exercises    PT Home Exercise Plan LE isometrics; 9/2:  heel raises, squats, bridge and sidelying abduction.    Consulted and Agree with Plan of Care Patient             Patient will benefit from skilled therapeutic intervention in order to improve the following deficits and impairments:  Abnormal gait, Decreased activity tolerance, Decreased balance, Decreased mobility, Decreased endurance, Decreased strength, Difficulty walking, Improper body mechanics, Pain  Visit Diagnosis: Muscle weakness (generalized)  Unsteadiness on feet  Difficulty in walking, not elsewhere classified     Problem List Patient Active Problem List   Diagnosis Date Noted   Post-op pain 07/12/2020   Splenic lesion 07/12/2020   Status post total replacement of left hip 06/05/2020   Primary osteoarthritis of left hip 02/09/2020   Pain in right knee 02/09/2020   Palliative care patient 09/18/2018   Port-A-Cath in place 08/20/2018   Genetic testing 08/20/2018   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[place]) 08/17/2018   Splenic laceration 07/31/2018   Family history of uterine cancer    Family history of breast cancer    Symptomatic anemia 07/18/2018   Upper GI bleeding 07/18/2018   Ovarian cancer, bilateral 07/18/2018   HCAP (healthcare-associated  pneumonia) 07/18/2018   Pleural effusion 07/07/2018   Migraine 07/06/2018   Pleural effusion on left 07/06/2018   Tobacco abuse 07/06/2018   Generalized anxiety disorder 05/02/2014   Unspecified constipation 08/10/2013   Rectal bleeding 08/10/2013   Lumbosacral spondylosis without myelopathy 10/23/2011   Intractable migraine without aura 10/23/2011    Teena Irani, PTA 09/22/2020, 12:46 PM  [place] [address] [place], [place], [address] Phone: [phone]   Fax:  [phone]  Name: Michelle Holt MRN: [id number] Date of Birth: 08/07/1970

## 2020-09-25 ENCOUNTER — Ambulatory Visit (HOSPITAL_COMMUNITY): Payer: Medicare Other

## 2020-09-25 ENCOUNTER — Other Ambulatory Visit: Payer: Self-pay

## 2020-09-25 ENCOUNTER — Encounter (HOSPITAL_COMMUNITY): Payer: Self-pay

## 2020-09-25 DIAGNOSIS — M6281 Muscle weakness (generalized): Secondary | ICD-10-CM | POA: Diagnosis not present

## 2020-09-25 DIAGNOSIS — R262 Difficulty in walking, not elsewhere classified: Secondary | ICD-10-CM

## 2020-09-25 DIAGNOSIS — R2681 Unsteadiness on feet: Secondary | ICD-10-CM

## 2020-09-25 NOTE — Therapy (Signed)
[place], [place], [address] Phone: [phone]   Fax:  [phone]  Physical Therapy Treatment  Patient Details  Name: Michelle Holt MRN: [id number] Date of Birth: 02/02/1970 Referring Provider (PT): Erlinda Hong   Encounter Date: 09/25/2020   PT End of Session - 09/25/20 1139     Visit Number 5    Number of Visits 12    Date for PT Re-Evaluation 09/25/20    Authorization Type [place] Medicaid [employer], auth required    Authorization - Visit Number 5    Authorization - Number of Visits 12    Progress Note Due on Visit 10    PT Start Time [id number]    PT Stop Time 1225    PT Time Calculation (min) 43 min    Activity Tolerance Patient tolerated treatment well    Behavior During Therapy WFL for tasks assessed/performed             Past Medical History:  Diagnosis Date   Anemia    Anxiety and depression    Arthritis of facet joints at multiple vertebral levels    L5-S1   Constipation    Dyslipidemia    Family history of breast cancer    Family history of uterine cancer    GERD (gastroesophageal reflux disease)    History of hiatal hernia    History of kidney stones    Insomnia    Irritable bowel syndrome    Migraine    Muscle tension headache    Neuropathy of finger    Ovarian carcinoma ([place])    ovarian   Plantar fasciitis of right foot    Port-A-Cath in place 08/20/2018    Past Surgical History:  Procedure Laterality Date   ABDOMINAL HYSTERECTOMY     CHOLECYSTECTOMY  2008   COLONOSCOPY N/A 08/13/2013   Procedure: COLONOSCOPY;  Surgeon: Rogene Houston, MD;  [facility];  Service: Endoscopy;  Laterality: N/A;  230-moved to 145 Ann to notify pt   ESOPHAGOGASTRODUODENOSCOPY     ESOPHAGOGASTRODUODENOSCOPY (EGD) WITH PROPOFOL N/A 07/20/2018   Procedure: ESOPHAGOGASTRODUODENOSCOPY (EGD) WITH PROPOFOL;  Surgeon: Rogene Houston, MD;  [facility];  Service: Endoscopy;  Laterality: N/A;   Possible esophageal dilation.   IR ANGIOGRAM SELECTIVE EACH ADDITIONAL VESSEL  08/01/2018   IR ANGIOGRAM VISCERAL SELECTIVE  08/01/2018   IR EMBO ART  VEN HEMORR LYMPH EXTRAV  INC GUIDE ROADMAPPING  08/01/2018   IR IMAGING GUIDED PORT INSERTION  08/20/2018   IR PERC PLEURAL DRAIN W/INDWELL CATH W/IMG GUIDE  07/08/2018   IR THORACENTESIS ASP PLEURAL SPACE W/IMG GUIDE  07/07/2018   IR US GUIDE VASC ACCESS RIGHT  08/01/2018   PLEURAL EFFUSION DRAINAGE Left 07/13/2018   Procedure: DRAINAGE OF LOCULATED PLEURAL EFFUSION;  Surgeon: Ivin Poot, MD;  Location: [place];  Service: Thoracic;  Laterality: Left;   REMOVAL OF PLEURAL DRAINAGE CATHETER Left 08/20/2018   Procedure: REMOVAL OF PLEURAL DRAINAGE CATHETER;  Surgeon: Ivin Poot, MD;  Location: [place];  Service: Thoracic;  Laterality: Left;   REMOVAL OF PLEURAL DRAINAGE CATHETER Left 08/20/2018   Procedure: REMOVAL OF PLEURAL DRAINAGE CATHETER;  Surgeon: Ivin Poot, MD;  Location: [place];  Service: Thoracic;  Laterality: Left;   TALC PLEURODESIS Left 07/13/2018   Procedure: Talc Pleuradesis;  Surgeon: Prescott Gum, Collier Salina, MD;  Location: [place];  Service: Thoracic;  Laterality: Left;   TOTAL HIP ARTHROPLASTY Left 06/05/2020   Procedure:  LEFT TOTAL HIP ARTHROPLASTY ANTERIOR APPROACH;  Surgeon: Leandrew Koyanagi, MD;  Location: [place];  Service: Orthopedics;  Laterality: Left;  3-C   TUBAL LIGATION Bilateral    UTERINE ABLATION     VIDEO ASSISTED THORACOSCOPY Left 07/13/2018   Procedure: VIDEO ASSISTED THORACOSCOPY;  Surgeon: Ivin Poot, MD;  Location: [place];  Service: Thoracic;  Laterality: Left;    There were no vitals filed for this visit.   Subjective Assessment - 09/25/20 1139     Subjective Was sore after last session. In more pain today but doesn't think it is connected to exercises last session. Has been performing HEP.    Currently in Pain? Yes    Pain Score 4     Pain Location Buttocks    Pain Orientation Left    Pain Descriptors /  Indicators Burning;Michelle Holt Adult PT Treatment/Exercise - 09/25/20 0001       Knee/Hip Exercises: Standing   Heel Raises 20 reps    Hip Abduction Stengthening;Left;10 reps;2 sets    Abduction Limitations 3#    Hip Extension Stengthening;Left;10 reps;2 sets    Extension Limitations 3#    Lateral Step Up Left;10 reps;Hand Hold: 1;Step Height: 4"    Forward Step Up Left;10 reps;Hand Hold: 2;Step Height: 4"    Step Down Left;10 reps;Hand Hold: 1;Step Height: 4"    Functional Squat 15 reps    SLS with Vectors 5X5" holds with 1 HHA on Lt LE    Other Standing Knee Exercises side step w/ squat 2RT    Other Standing Knee Exercises Pallof press GTB semi-tandem x10 bilateral each direction                     PT Education - 09/25/20 1153     Education Details Discussed purpose and technique of skilled interventions throughout session. Discussed self soft tissue mobilzations with tennis ball to decrease pain.    Person(s) Educated Patient    Methods Explanation    Comprehension Verbalized understanding              PT Short Term Goals - 09/25/20 1140       PT SHORT TERM GOAL #1   Title Patient will be independent with HEP in order to improve functional outcomes.    Time 3    Period Weeks    Status On-going    Target Date 09/04/20      PT SHORT TERM GOAL #2   Title Patient will report at least 25% improvement in symptoms for improved quality of life.    Time 3    Period Weeks    Status On-going    Target Date 09/04/20      PT SHORT TERM GOAL #3   Title Manifest improved BLE strength and balance as evidenced by time of 14 sec 5xSTS    Baseline 19 sec    Time 3    Period Weeks    Status On-going    Target Date 09/04/20               PT Long Term Goals - 09/25/20 1140       PT LONG TERM GOAL #1   Title Demonstrate improved gait velocity as evidenced by distance of 350 ft during 2MWT    Baseline 250 ft    Time 6    Period Weeks     Status On-going  PT LONG TERM GOAL #2   Title Demo left hip abduction strength 4/5 to improve gait stability    Baseline 3-/5 left hip abduction    Time 6    Period Weeks    Status On-going                   Plan - 09/25/20 1140     Clinical Impression Statement Session focused on core and lower extremity strengthening. Patient educated on self soft tissue mobilizations with tennis ball for pain management. Patient has tennis ball and ankle weights at home and has been performing HEP. Discussed proper form of exercises for focused strengthening in hip without compensations. Added Pallof press to therapeutic exercises for core strengthening and balance challenge. Patient reports moderate muscle fatigue at end of session but no pain. Patient will continue to benefit from skilled therapy services to reduce deficits and improve functional ability.    Personal Factors and Comorbidities Past/Current Experience    Examination-Activity Limitations Carry;Lift;Stand;Stairs;Squat;Locomotion Level;Transfers;Bed Mobility;Bend    Examination-Participation Restrictions Cleaning;Community Activity;Occupation;Meal Prep;Shop    Stability/Clinical Decision Making Stable/Uncomplicated    Rehab Potential Excellent    PT Frequency 2x / week    PT Duration 6 weeks    PT Treatment/Interventions ADLs/Self Care Home Management;Aquatic Therapy;Electrical Stimulation;DME Instruction;Gait training;Stair training;Functional mobility training;Therapeutic activities;Therapeutic exercise;Balance training;Patient/family education;Neuromuscular re-education;Manual techniques;Taping;Dry needling;Spinal Manipulations;Joint Manipulations    PT Next Visit Plan progress HEP to dynamic exercises; glut and piriformis stretches for HEP next session.    PT Home Exercise Plan LE isometrics; 9/2:  heel raises, squats, bridge and sidelying abduction; 9/12 tennis ball soft tissue mobs    Consulted and Agree with Plan of  Care Patient             Patient will benefit from skilled therapeutic intervention in order to improve the following deficits and impairments:  Abnormal gait, Decreased activity tolerance, Decreased balance, Decreased mobility, Decreased endurance, Decreased strength, Difficulty walking, Improper body mechanics, Pain  Visit Diagnosis: Muscle weakness (generalized)  Difficulty in walking, not elsewhere classified  Unsteadiness on feet     Problem List Patient Active Problem List   Diagnosis Date Noted   Post-op pain 07/12/2020   Splenic lesion 07/12/2020   Status post total replacement of left hip 06/05/2020   Primary osteoarthritis of left hip 02/09/2020   Pain in right knee 02/09/2020   Palliative care patient 09/18/2018   Port-A-Cath in place 08/20/2018   Genetic testing 08/20/2018   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[place]) 08/17/2018   Splenic laceration 07/31/2018   Family history of uterine cancer    Family history of breast cancer    Symptomatic anemia 07/18/2018   Upper GI bleeding 07/18/2018   Ovarian cancer, bilateral 07/18/2018   HCAP (healthcare-associated pneumonia) 07/18/2018   Pleural effusion 07/07/2018   Migraine 07/06/2018   Pleural effusion on left 07/06/2018   Tobacco abuse 07/06/2018   Generalized anxiety disorder 05/02/2014   Unspecified constipation 08/10/2013   Rectal bleeding 08/10/2013   Lumbosacral spondylosis without myelopathy 10/23/2011   Intractable migraine without aura 10/23/2011   Michelle Hurry D. Hartnett-Rands, MS, PT Per [place] [phone]  Michelle Holt, PT 09/25/2020, 12:41 PM  [facility] [address] [place], [place], [address] Phone: [phone]   Fax:  [phone]  Name: Michelle Holt MRN: [id number] Date of Birth: 02/17/1970

## 2020-09-27 ENCOUNTER — Other Ambulatory Visit: Payer: Self-pay

## 2020-09-27 ENCOUNTER — Ambulatory Visit (HOSPITAL_COMMUNITY): Payer: Medicare Other | Admitting: Physical Therapy

## 2020-09-27 DIAGNOSIS — R262 Difficulty in walking, not elsewhere classified: Secondary | ICD-10-CM

## 2020-09-27 DIAGNOSIS — M6281 Muscle weakness (generalized): Secondary | ICD-10-CM

## 2020-09-27 DIAGNOSIS — R2681 Unsteadiness on feet: Secondary | ICD-10-CM

## 2020-09-27 NOTE — Therapy (Signed)
[facility], [place], [address] Phone: [phone]   Fax:  [phone]  Physical Therapy Treatment  Patient Details  Name: Michelle Holt MRN: [id number] Date of Birth: Feb 16, 1970 Referring Provider (PT): Erlinda Hong   Encounter Date: 09/27/2020   PT End of Session - 09/27/20 1140     Visit Number 6    Number of Visits 12    Date for PT Re-Evaluation 09/25/20    Authorization Type [place] [employer], auth required    Authorization - Visit Number 6    Authorization - Number of Visits 12    Progress Note Due on Visit 10    PT Start Time 1132    PT Stop Time 1212    PT Time Calculation (min) 40 min    Activity Tolerance Patient tolerated treatment well    Behavior During Therapy [facility] for tasks assessed/performed             Past Medical History:  Diagnosis Date   Anemia    Anxiety and depression    Arthritis of facet joints at multiple vertebral levels    L5-S1   Constipation    Dyslipidemia    Family history of breast cancer    Family history of uterine cancer    GERD (gastroesophageal reflux disease)    History of hiatal hernia    History of kidney stones    Insomnia    Irritable bowel syndrome    Migraine    Muscle tension headache    Neuropathy of finger    Ovarian carcinoma ([place])    ovarian   Plantar fasciitis of right foot    Port-A-Cath in place 08/20/2018    Past Surgical History:  Procedure Laterality Date   ABDOMINAL HYSTERECTOMY     CHOLECYSTECTOMY  2008   COLONOSCOPY N/A 08/13/2013   Procedure: COLONOSCOPY;  Surgeon: Rogene Houston, MD;  [facility];  Service: Endoscopy;  Laterality: N/A;  230-moved to 145 Ann to notify pt   ESOPHAGOGASTRODUODENOSCOPY     ESOPHAGOGASTRODUODENOSCOPY (EGD) WITH PROPOFOL N/A 07/20/2018   Procedure: ESOPHAGOGASTRODUODENOSCOPY (EGD) WITH PROPOFOL;  Surgeon: Rogene Houston, MD;  [facility];  Service: Endoscopy;  Laterality: N/A;   Possible esophageal dilation.   IR ANGIOGRAM SELECTIVE EACH ADDITIONAL VESSEL  08/01/2018   IR ANGIOGRAM VISCERAL SELECTIVE  08/01/2018   IR EMBO ART  VEN HEMORR LYMPH EXTRAV  INC GUIDE ROADMAPPING  08/01/2018   IR IMAGING GUIDED PORT INSERTION  08/20/2018   IR PERC PLEURAL DRAIN W/INDWELL CATH W/IMG GUIDE  07/08/2018   IR THORACENTESIS ASP PLEURAL SPACE W/IMG GUIDE  07/07/2018   IR US GUIDE VASC ACCESS RIGHT  08/01/2018   PLEURAL EFFUSION DRAINAGE Left 07/13/2018   Procedure: DRAINAGE OF LOCULATED PLEURAL EFFUSION;  Surgeon: Ivin Poot, MD;  Location: [facility];  Service: Thoracic;  Laterality: Left;   REMOVAL OF PLEURAL DRAINAGE CATHETER Left 08/20/2018   Procedure: REMOVAL OF PLEURAL DRAINAGE CATHETER;  Surgeon: Ivin Poot, MD;  Location: [facility];  Service: Thoracic;  Laterality: Left;   REMOVAL OF PLEURAL DRAINAGE CATHETER Left 08/20/2018   Procedure: REMOVAL OF PLEURAL DRAINAGE CATHETER;  Surgeon: Ivin Poot, MD;  Location: [facility];  Service: Thoracic;  Laterality: Left;   TALC PLEURODESIS Left 07/13/2018   Procedure: [place] Pleuradesis;  Surgeon: Prescott Gum, Collier Salina, MD;  Location: [place];  Service: Thoracic;  Laterality: Left;   TOTAL HIP ARTHROPLASTY Left 06/05/2020   Procedure:  LEFT TOTAL HIP ARTHROPLASTY ANTERIOR APPROACH;  Surgeon: Leandrew Koyanagi, MD;  Location: [facility];  Service: Orthopedics;  Laterality: Left;  3-C   TUBAL LIGATION Bilateral    UTERINE ABLATION     VIDEO ASSISTED THORACOSCOPY Left 07/13/2018   Procedure: VIDEO ASSISTED THORACOSCOPY;  Surgeon: Ivin Poot, MD;  Location: [facility];  Service: Thoracic;  Laterality: Left;    There were no vitals filed for this visit.   Subjective Assessment - 09/27/20 1131     Subjective Pt continues to be sore feels like she is working hard in therapy but she can tell a big difference.  She is still having problems coming sit to stand on soft or real low chairs.    How long can you walk comfortably? an hour was 5-10 minutes    Currently in  Pain? Yes    Pain Score 5     Pain Location Hip    Pain Orientation Left    Pain Descriptors / Indicators Sore    Pain Type Acute pain    Pain Onset More than a month ago    Pain Frequency Constant                               [facility] Adult PT Treatment/Exercise - 09/27/20 0001       Exercises   Exercises Knee/Hip      Knee/Hip Exercises: Stretches   Passive Hamstring Stretch Both;3 reps;30 seconds      Knee/Hip Exercises: Standing   Heel Raises 15 reps    Heel Raises Limitations one hand hold    Hip Abduction Stengthening;Left;20 reps    Abduction Limitations 3#    Hip Extension Stengthening;Left;20 reps    Extension Limitations 3 #    Lateral Step Up Left;10 reps    Forward Step Up Left;10 reps    Step Down Left;10 reps    Functional Squat 20 reps    Functional Squat Limitations 3# goblet squat    SLS on foam    Other Standing Knee Exercises marching x 10; side step w/ squat 2RT    Other Standing Knee Exercises Pallof press GTB semi-tandem x10 bilateral each direction                       PT Short Term Goals - 09/27/20 1208       PT SHORT TERM GOAL #1   Title Patient will be independent with HEP in order to improve functional outcomes.    Time 3    Period Weeks    Status Achieved    Target Date 09/04/20      PT SHORT TERM GOAL #2   Title Patient will report at least 25% improvement in symptoms for improved quality of life.    Time 3    Period Weeks    Status Achieved    Target Date 09/04/20      PT SHORT TERM GOAL #3   Title Manifest improved BLE strength and balance as evidenced by time of 14 sec 5xSTS    Baseline 19 sec    Time 3    Period Weeks    Status On-going    Target Date 09/04/20               PT Long Term Goals - 09/27/20 1209       PT LONG TERM GOAL #1   Title Demonstrate improved  gait velocity as evidenced by distance of 350 ft during 2MWT    Baseline 250 ft    Time 6    Period Weeks     Status On-going      PT LONG TERM GOAL #2   Title Demo left hip abduction strength 4/5 to improve gait stability    Baseline 3-/5 left hip abduction    Time 6    Period Weeks    Status On-going                   Plan - 09/27/20 1143     Clinical Impression Statement Session continued to focus on core and LE strength. Therapist did not add anything as pt continues to complain of soreness in her Lt hip; did add foam to single leg stance to challenge balance.    Personal Factors and Comorbidities Past/Current Experience    Examination-Activity Limitations Carry;Lift;Stand;Stairs;Squat;Locomotion Level;Transfers;Bed Mobility;Bend    Examination-Participation Restrictions Cleaning;Community Activity;Occupation;Meal Prep;Shop    Stability/Clinical Decision Making Stable/Uncomplicated    Clinical Decision Making Low    Rehab Potential Excellent    PT Frequency 2x / week    PT Duration 6 weeks    PT Treatment/Interventions ADLs/Self Care Home Management;Aquatic Therapy;Electrical Stimulation;DME Instruction;Gait training;Stair training;Functional mobility training;Therapeutic activities;Therapeutic exercise;Balance training;Patient/family education;Neuromuscular re-education;Manual techniques;Taping;Dry needling;Spinal Manipulations;Joint Manipulations    PT Next Visit Plan progress HEP to dynamic exercises;    PT Home Exercise Plan LE isometrics; 9/2:  heel raises, squats, bridge and sidelying abduction; 9/12 tennis ball soft tissue mobs; 9/14; sit to stand ,             Patient will benefit from skilled therapeutic intervention in order to improve the following deficits and impairments:  Abnormal gait, Decreased activity tolerance, Decreased balance, Decreased mobility, Decreased endurance, Decreased strength, Difficulty walking, Improper body mechanics, Pain  Visit Diagnosis: Muscle weakness (generalized)  Difficulty in walking, not elsewhere classified  Unsteadiness on  feet     Problem List Patient Active Problem List   Diagnosis Date Noted   Post-op pain 07/12/2020   Splenic lesion 07/12/2020   Status post total replacement of left hip 06/05/2020   Primary osteoarthritis of left hip 02/09/2020   Pain in right knee 02/09/2020   Palliative care patient 09/18/2018   Port-A-Cath in place 08/20/2018   Genetic testing 08/20/2018   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[place]) 08/17/2018   Splenic laceration 07/31/2018   Family history of uterine cancer    Family history of breast cancer    Symptomatic anemia 07/18/2018   Upper GI bleeding 07/18/2018   Ovarian cancer, bilateral 07/18/2018   HCAP (healthcare-associated pneumonia) 07/18/2018   Pleural effusion 07/07/2018   Migraine 07/06/2018   Pleural effusion on left 07/06/2018   Tobacco abuse 07/06/2018   Generalized anxiety disorder 05/02/2014   Unspecified constipation 08/10/2013   Rectal bleeding 08/10/2013   Lumbosacral spondylosis without myelopathy 10/23/2011   Intractable migraine without aura 10/23/2011   Rayetta Humphrey, PT CLT [phone] , PT 09/27/2020, 12:13 PM  [facility] [address] [place], [place], [address] Phone: [phone]   Fax:  [phone]  Name: Michelle Holt MRN: [id number] Date of Birth: 19-Feb-1970

## 2020-10-03 ENCOUNTER — Other Ambulatory Visit (HOSPITAL_COMMUNITY): Payer: Self-pay

## 2020-10-03 DIAGNOSIS — D649 Anemia, unspecified: Secondary | ICD-10-CM

## 2020-10-03 DIAGNOSIS — C563 Malignant neoplasm of bilateral ovaries: Secondary | ICD-10-CM

## 2020-10-05 ENCOUNTER — Other Ambulatory Visit: Payer: Self-pay

## 2020-10-05 ENCOUNTER — Inpatient Hospital Stay (HOSPITAL_COMMUNITY): Payer: Medicare Other | Attending: Hematology

## 2020-10-05 ENCOUNTER — Encounter (HOSPITAL_COMMUNITY): Payer: Self-pay | Admitting: *Deleted

## 2020-10-05 ENCOUNTER — Encounter (HOSPITAL_COMMUNITY): Payer: Self-pay

## 2020-10-05 DIAGNOSIS — R1012 Left upper quadrant pain: Secondary | ICD-10-CM | POA: Diagnosis not present

## 2020-10-05 DIAGNOSIS — R232 Flushing: Secondary | ICD-10-CM | POA: Insufficient documentation

## 2020-10-05 DIAGNOSIS — D649 Anemia, unspecified: Secondary | ICD-10-CM | POA: Insufficient documentation

## 2020-10-05 DIAGNOSIS — Z7982 Long term (current) use of aspirin: Secondary | ICD-10-CM | POA: Insufficient documentation

## 2020-10-05 DIAGNOSIS — K921 Melena: Secondary | ICD-10-CM | POA: Insufficient documentation

## 2020-10-05 DIAGNOSIS — Z90722 Acquired absence of ovaries, bilateral: Secondary | ICD-10-CM | POA: Diagnosis not present

## 2020-10-05 DIAGNOSIS — Z8543 Personal history of malignant neoplasm of ovary: Secondary | ICD-10-CM | POA: Insufficient documentation

## 2020-10-05 DIAGNOSIS — Z79899 Other long term (current) drug therapy: Secondary | ICD-10-CM | POA: Insufficient documentation

## 2020-10-05 DIAGNOSIS — C563 Malignant neoplasm of bilateral ovaries: Secondary | ICD-10-CM

## 2020-10-05 DIAGNOSIS — Z9079 Acquired absence of other genital organ(s): Secondary | ICD-10-CM | POA: Insufficient documentation

## 2020-10-05 DIAGNOSIS — Z90710 Acquired absence of both cervix and uterus: Secondary | ICD-10-CM | POA: Insufficient documentation

## 2020-10-05 DIAGNOSIS — G629 Polyneuropathy, unspecified: Secondary | ICD-10-CM | POA: Diagnosis not present

## 2020-10-05 DIAGNOSIS — Z9221 Personal history of antineoplastic chemotherapy: Secondary | ICD-10-CM | POA: Insufficient documentation

## 2020-10-05 LAB — COMPREHENSIVE METABOLIC PANEL
ALT: 10 U/L (ref 0–44)
AST: 13 U/L — ABNORMAL LOW (ref 15–41)
Albumin: 3.3 g/dL — ABNORMAL LOW (ref 3.5–5.0)
Alkaline Phosphatase: 88 U/L (ref 38–126)
Anion gap: 6 (ref 5–15)
BUN: 13 mg/dL (ref 6–20)
CO2: 27 mmol/L (ref 22–32)
Calcium: 8.8 mg/dL — ABNORMAL LOW (ref 8.9–10.3)
Chloride: 105 mmol/L (ref 98–111)
Creatinine, Ser: 0.55 mg/dL (ref 0.44–1.00)
GFR, Estimated: >60 mL/min (ref >60)
Glucose, Bld: 99 mg/dL (ref 70–99)
Potassium: 4.2 mmol/L (ref 3.5–5.1)
Sodium: 138 mmol/L (ref 135–145)
Total Bilirubin: 0.2 mg/dL — ABNORMAL LOW (ref 0.3–1.2)
Total Protein: 6.7 g/dL (ref 6.5–8.1)

## 2020-10-05 LAB — CBC WITH DIFFERENTIAL/PLATELET
Abs Immature Granulocytes: 0.03 10*3/uL (ref 0.00–0.07)
Basophils Absolute: 0 10*3/uL (ref 0.0–0.1)
Basophils Relative: 1 %
Eosinophils Absolute: 0.2 10*3/uL (ref 0.0–0.5)
Eosinophils Relative: 3 %
HCT: 22.4 % — ABNORMAL LOW (ref 36.0–46.0)
Hemoglobin: 6.7 g/dL — CL (ref 12.0–15.0)
Immature Granulocytes: 0 %
Lymphocytes Relative: 23 %
Lymphs Abs: 1.5 10*3/uL (ref 0.7–4.0)
MCH: 28.3 pg (ref 26.0–34.0)
MCHC: 29.9 g/dL — ABNORMAL LOW (ref 30.0–36.0)
MCV: 94.5 fL (ref 80.0–100.0)
Monocytes Absolute: 0.5 10*3/uL (ref 0.1–1.0)
Monocytes Relative: 7 %
Neutro Abs: 4.4 10*3/uL (ref 1.7–7.7)
Neutrophils Relative %: 66 %
Platelets: 520 10*3/uL — ABNORMAL HIGH (ref 150–400)
RBC: 2.37 MIL/uL — ABNORMAL LOW (ref 3.87–5.11)
RDW: 16.6 % — ABNORMAL HIGH (ref 11.5–15.5)
WBC: 6.8 10*3/uL (ref 4.0–10.5)
nRBC: 0 % (ref 0.0–0.2)

## 2020-10-05 LAB — IRON AND TIBC
Iron: 10 ug/dL — ABNORMAL LOW (ref 28–170)
Saturation Ratios: 2 % — ABNORMAL LOW (ref 10.4–31.8)
TIBC: 516 ug/dL — ABNORMAL HIGH (ref 250–450)
UIBC: 506 ug/dL

## 2020-10-05 LAB — SAMPLE TO BLOOD BANK

## 2020-10-05 LAB — FERRITIN: Ferritin: 10 ng/mL — ABNORMAL LOW (ref 11–307)

## 2020-10-05 LAB — MAGNESIUM: Magnesium: 2.2 mg/dL (ref 1.7–2.4)

## 2020-10-05 LAB — PREPARE RBC (CROSSMATCH)

## 2020-10-05 MED ORDER — SODIUM CHLORIDE 0.9% FLUSH
10.0000 mL | Freq: Once | INTRAVENOUS | Status: AC
  Administered 2020-10-05: 10 mL via INTRAVENOUS

## 2020-10-05 MED ORDER — HEPARIN SOD (PORK) LOCK FLUSH 100 UNIT/ML IV SOLN
500.0000 [IU] | Freq: Once | INTRAVENOUS | Status: AC
  Administered 2020-10-05: 500 [IU] via INTRAVENOUS

## 2020-10-05 NOTE — Patient Instructions (Signed)
[facility]  Discharge Instructions: Thank you for choosing [facility] to provide your oncology and hematology care.  If you have a lab appointment with the [facility], please come in thru the Main Entrance and check in at the main information desk.  Wear comfortable clothing and clothing appropriate for easy access to any Portacath or PICC line.   We strive to give you quality time with your provider. You may need to reschedule your appointment if you arrive late (15 or more minutes).  Arriving late affects you and other patients whose appointments are after yours.  Also, if you miss three or more appointments without notifying the office, you may be dismissed from the clinic at the provider's discretion.      For prescription refill requests, have your pharmacy contact our office and allow 72 hours for refills to be completed.    Today your port was flush and labs were drawn. 3 Occult blood cards were given to you with instructions printed. Return as scheduled.   To help prevent nausea and vomiting after your treatment, we encourage you to take your nausea medication as directed.  BELOW ARE SYMPTOMS THAT SHOULD BE REPORTED IMMEDIATELY: *FEVER GREATER THAN 100.4 F (38 C) OR HIGHER *CHILLS OR SWEATING *NAUSEA AND VOMITING THAT IS NOT CONTROLLED WITH YOUR NAUSEA MEDICATION *UNUSUAL SHORTNESS OF BREATH *UNUSUAL BRUISING OR BLEEDING *URINARY PROBLEMS (pain or burning when urinating, or frequent urination) *BOWEL PROBLEMS (unusual diarrhea, constipation, pain near the anus) TENDERNESS IN MOUTH AND THROAT WITH OR WITHOUT PRESENCE OF ULCERS (sore throat, sores in mouth, or a toothache) UNUSUAL RASH, SWELLING OR PAIN  UNUSUAL VAGINAL DISCHARGE OR ITCHING   Items with * indicate a potential emergency and should be followed up as soon as possible or go to the Emergency Department if any problems should occur.  Please show the CHEMOTHERAPY ALERT CARD or IMMUNOTHERAPY  ALERT CARD at check-in to the Emergency Department and triage nurse.  Should you have questions after your visit or need to cancel or reschedule your appointment, please contact [facility] [phone]  and follow the prompts.  Office hours are 8:00 a.m. to 4:30 p.m. Monday - Friday. Please note that voicemails left after 4:00 p.m. may not be returned until the following business day.  We are closed weekends and major holidays. You have access to a nurse at all times for urgent questions. Please call the main number to the clinic [phone] and follow the prompts.  For any non-urgent questions, you may also contact your provider using MyChart. We now offer e-Visits for anyone 14 and older to request care online for non-urgent symptoms. For details visit [email or web address].   Also download the MyChart app! Go to the app store, search "MyChart", open the app, select [facility], and log in with your MyChart username and password.  Due to Covid, a mask is required upon entering the hospital/clinic. If you do not have a mask, one will be given to you upon arrival. For doctor visits, patients may have 1 support person aged 37 or older with them. For treatment visits, patients cannot have anyone with them due to current Covid guidelines and our immunocompromised population.

## 2020-10-05 NOTE — Progress Notes (Signed)
Patient presented today for port flush and lab draw. Three occult blood samples were given to patient with printed patient instructions on how to collect sample. Patient verbalized understanding. Port flushed with good blood return noted. No bruising or swelling at site. Bandaid applied and patient discharged in satisfactory condition. VVS stable with no signs or symptoms of distressed noted.

## 2020-10-05 NOTE — Addendum Note (Signed)
Addended by: Joie Bimler on: 10/05/2020 01:09 PM   Modules accepted: Orders, SmartSet

## 2020-10-06 ENCOUNTER — Other Ambulatory Visit (HOSPITAL_COMMUNITY): Payer: Self-pay | Admitting: Physician Assistant

## 2020-10-06 ENCOUNTER — Inpatient Hospital Stay (HOSPITAL_COMMUNITY): Payer: Medicare Other

## 2020-10-06 VITALS — BP 97/41 | HR 75 | Temp 97.3°F | Resp 17 | Wt 183.4 lb

## 2020-10-06 DIAGNOSIS — D649 Anemia, unspecified: Secondary | ICD-10-CM

## 2020-10-06 DIAGNOSIS — C563 Malignant neoplasm of bilateral ovaries: Secondary | ICD-10-CM

## 2020-10-06 LAB — CA 125: Cancer Antigen (CA) 125: 36.5 U/mL (ref 0.0–38.1)

## 2020-10-06 MED ORDER — HEPARIN SOD (PORK) LOCK FLUSH 100 UNIT/ML IV SOLN
500.0000 [IU] | Freq: Every day | INTRAVENOUS | Status: AC | PRN
  Administered 2020-10-06: 500 [IU]

## 2020-10-06 MED ORDER — SODIUM CHLORIDE 0.9% IV SOLUTION
250.0000 mL | Freq: Once | INTRAVENOUS | Status: AC
  Administered 2020-10-06: 250 mL via INTRAVENOUS

## 2020-10-06 MED ORDER — LORATADINE 10 MG PO TABS: 10.0000 mg | ORAL_TABLET | Freq: Once | ORAL | Status: DC

## 2020-10-06 MED ORDER — SODIUM CHLORIDE 0.9 % IV SOLN: INTRAVENOUS | Status: DC

## 2020-10-06 MED ORDER — ACETAMINOPHEN 325 MG PO TABS
650.0000 mg | ORAL_TABLET | Freq: Once | ORAL | Status: AC
  Administered 2020-10-06: 650 mg via ORAL
  Filled 2020-10-06: qty 2

## 2020-10-06 MED ORDER — FAMOTIDINE 20 MG PO TABS
20.0000 mg | ORAL_TABLET | Freq: Once | ORAL | Status: AC
  Administered 2020-10-06: 20 mg via ORAL
  Filled 2020-10-06: qty 1

## 2020-10-06 MED ORDER — DIPHENHYDRAMINE HCL 25 MG PO CAPS
25.0000 mg | ORAL_CAPSULE | Freq: Once | ORAL | Status: AC
  Administered 2020-10-06: 25 mg via ORAL
  Filled 2020-10-06: qty 1

## 2020-10-06 MED ORDER — SODIUM CHLORIDE 0.9 % IV SOLN
510.0000 mg | Freq: Once | INTRAVENOUS | Status: AC
  Administered 2020-10-06: 510 mg via INTRAVENOUS
  Filled 2020-10-06: qty 510

## 2020-10-06 MED ORDER — SODIUM CHLORIDE 0.9% FLUSH
10.0000 mL | INTRAVENOUS | Status: AC | PRN
  Administered 2020-10-06: 10 mL

## 2020-10-06 NOTE — Patient Instructions (Signed)
[facility]  Discharge Instructions: Thank you for choosing [facility] to provide your oncology and hematology care.  If you have a lab appointment with the Cancer Center, please come in thru the Main Entrance and check in at the main information desk.  Wear comfortable clothing and clothing appropriate for easy access to any Portacath or PICC line.   We strive to give you quality time with your provider. You may need to reschedule your appointment if you arrive late (15 or more minutes).  Arriving late affects you and other patients whose appointments are after yours.  Also, if you miss three or more appointments without notifying the office, you may be dismissed from the clinic at the provider's discretion.      For prescription refill requests, have your pharmacy contact our office and allow 72 hours for refills to be completed.        To help prevent nausea and vomiting after your treatment, we encourage you to take your nausea medication as directed.  BELOW ARE SYMPTOMS THAT SHOULD BE REPORTED IMMEDIATELY: *FEVER GREATER THAN 100.4 F (38 C) OR HIGHER *CHILLS OR SWEATING *NAUSEA AND VOMITING THAT IS NOT CONTROLLED WITH YOUR NAUSEA MEDICATION *UNUSUAL SHORTNESS OF BREATH *UNUSUAL BRUISING OR BLEEDING *URINARY PROBLEMS (pain or burning when urinating, or frequent urination) *BOWEL PROBLEMS (unusual diarrhea, constipation, pain near the anus) TENDERNESS IN MOUTH AND THROAT WITH OR WITHOUT PRESENCE OF ULCERS (sore throat, sores in mouth, or a toothache) UNUSUAL RASH, SWELLING OR PAIN  UNUSUAL VAGINAL DISCHARGE OR ITCHING   Items with * indicate a potential emergency and should be followed up as soon as possible or go to the Emergency Department if any problems should occur.  Please show the CHEMOTHERAPY ALERT CARD or IMMUNOTHERAPY ALERT CARD at check-in to the Emergency Department and triage nurse.  Should you have questions after your visit or need to cancel  or reschedule your appointment, please contact [facility] [phone]  and follow the prompts.  Office hours are 8:00 a.m. to 4:30 p.m. Monday - Friday. Please note that voicemails left after 4:00 p.m. may not be returned until the following business day.  We are closed weekends and major holidays. You have access to a nurse at all times for urgent questions. Please call the main number to the clinic [phone] and follow the prompts.  For any non-urgent questions, you may also contact your provider using MyChart. We now offer e-Visits for anyone 18 and older to request care online for non-urgent symptoms. For details visit [email or web address].   Also download the MyChart app! Go to the app store, search "MyChart", open the app, select [facility], and log in with your MyChart username and password.  Due to Covid, a mask is required upon entering the hospital/clinic. If you do not have a mask, one will be given to you upon arrival. For doctor visits, patients may have 1 support person aged 18 or older with them. For treatment visits, patients cannot have anyone with them due to current Covid guidelines and our immunocompromised population.  

## 2020-10-06 NOTE — Progress Notes (Signed)
Patient presents today for blood transfusion and iron infusion.  Patient is in satisfactory condition with no new complaints voiced.  Vital signs are stable.  We will proceed with treatment per MD orders.   Blood transfusion administered first.  Patient tolerated transfusion well with no symptoms of reaction.  After a wait time and flushing, Feraheme was started.  Patient also tolerated iron infusion well with no symptoms of reaction.  Patient left ambulatory in stable condition.  Vital signs stable at discharge.  Follow up as scheduled.

## 2020-10-07 DIAGNOSIS — D649 Anemia, unspecified: Secondary | ICD-10-CM | POA: Diagnosis not present

## 2020-10-08 DIAGNOSIS — D649 Anemia, unspecified: Secondary | ICD-10-CM | POA: Diagnosis not present

## 2020-10-08 LAB — TYPE AND SCREEN
ABO/RH(D): O POS
Antibody Screen: NEGATIVE
Unit division: 0

## 2020-10-08 LAB — BPAM RBC
Blood Product Expiration Date: 202210242359
ISSUE DATE / TIME: 202209230908
Unit Type and Rh: 5100

## 2020-10-09 DIAGNOSIS — D649 Anemia, unspecified: Secondary | ICD-10-CM | POA: Diagnosis not present

## 2020-10-11 ENCOUNTER — Inpatient Hospital Stay (HOSPITAL_COMMUNITY): Payer: Medicare Other

## 2020-10-11 ENCOUNTER — Ambulatory Visit (HOSPITAL_COMMUNITY): Payer: Medicare Other | Admitting: Physical Therapy

## 2020-10-11 ENCOUNTER — Encounter (HOSPITAL_COMMUNITY): Payer: Self-pay

## 2020-10-11 ENCOUNTER — Other Ambulatory Visit: Payer: Self-pay

## 2020-10-11 DIAGNOSIS — D649 Anemia, unspecified: Secondary | ICD-10-CM | POA: Diagnosis not present

## 2020-10-11 DIAGNOSIS — C563 Malignant neoplasm of bilateral ovaries: Secondary | ICD-10-CM

## 2020-10-11 LAB — CBC WITH DIFFERENTIAL/PLATELET
Abs Immature Granulocytes: 0.05 10*3/uL (ref 0.00–0.07)
Basophils Absolute: 0.1 10*3/uL (ref 0.0–0.1)
Basophils Relative: 1 %
Eosinophils Absolute: 0.3 10*3/uL (ref 0.0–0.5)
Eosinophils Relative: 4 %
HCT: 31.7 % — ABNORMAL LOW (ref 36.0–46.0)
Hemoglobin: 9.5 g/dL — ABNORMAL LOW (ref 12.0–15.0)
Immature Granulocytes: 1 %
Lymphocytes Relative: 20 %
Lymphs Abs: 1.6 10*3/uL (ref 0.7–4.0)
MCH: 29.2 pg (ref 26.0–34.0)
MCHC: 30 g/dL (ref 30.0–36.0)
MCV: 97.5 fL (ref 80.0–100.0)
Monocytes Absolute: 0.6 10*3/uL (ref 0.1–1.0)
Monocytes Relative: 8 %
Neutro Abs: 5.4 10*3/uL (ref 1.7–7.7)
Neutrophils Relative %: 66 %
Platelets: 466 10*3/uL — ABNORMAL HIGH (ref 150–400)
RBC: 3.25 MIL/uL — ABNORMAL LOW (ref 3.87–5.11)
RDW: 21.1 % — ABNORMAL HIGH (ref 11.5–15.5)
WBC: 8 10*3/uL (ref 4.0–10.5)
nRBC: 0 % (ref 0.0–0.2)

## 2020-10-11 LAB — SAMPLE TO BLOOD BANK

## 2020-10-11 LAB — OCCULT BLOOD X 1 CARD TO LAB, STOOL
Fecal Occult Bld: POSITIVE — AB
Fecal Occult Bld: NEGATIVE
Fecal Occult Bld: POSITIVE — AB

## 2020-10-11 MED ORDER — HEPARIN SOD (PORK) LOCK FLUSH 100 UNIT/ML IV SOLN
500.0000 [IU] | Freq: Once | INTRAVENOUS | Status: AC
  Administered 2020-10-11: 500 [IU] via INTRAVENOUS

## 2020-10-11 MED ORDER — SODIUM CHLORIDE 0.9% FLUSH
10.0000 mL | Freq: Once | INTRAVENOUS | Status: AC
  Administered 2020-10-11: 10 mL via INTRAVENOUS

## 2020-10-11 NOTE — Progress Notes (Signed)
Port flushed with good blood return noted. No bruising or swelling at site. Bandaid applied and patient discharged in satisfactory condition. VVS stable with no signs or symptoms of distressed noted. 

## 2020-10-11 NOTE — Progress Notes (Signed)
Michelle Holt, [place] [address]   CLINIC:  Medical Oncology/Hematology  PCP:  [name], MD [address] / [place] [address] [phone]   REASON FOR VISIT:  Follow-up for high-grade serous ovarian cancer  PRIOR THERAPY:  1. Carboplatin and paclitaxel x 7 cycles from 08/24/2018 to 03/15/2019. 2. Laparoscopic TAH & BSO & omenectomy on 12/24/2018.  NGS Results: not done  CURRENT THERAPY: observation  BRIEF ONCOLOGIC HISTORY:  Oncology History  Ovarian cancer, bilateral  07/07/2018 Pathology Results   PLEURAL FLUID, LEFT (SPECIMEN 1 OF 1, COLLECTED 07/07/18): - MALIGNANT CELLS CONSISTENT WITH ADENOCARCINOMA - SEE COMMENT  Source Pleural Fluid, (Specimen 1 of 1, collected on 07/07/2018) Gross Specimen: Received is/are 1000cc of bloody red fluid with tissue. (TC:tc) Prepared: # Smears: 0 # Concentration Technique Slides (i.e. ThinPrep): 1 # Cell Block: 1 Conventional Additional Studies: Two Hematology slides labeled [id number] Comment The malignant cells are positive for cytokeratin 7, p53, WT-1, Pax-8, Moc31, ER (weak) and EMA but negative for cytokeratin 20, TTF-1, GATA-3, CDX-2 and D2-40. Overall, the phenotype is consistent with a gynecologic primary; clinical correlation recommended.   07/07/2018 Procedure   Successful ultrasound guided left thoracentesis yielding 2.0 L of pleural fluid   07/08/2018 Procedure   1. Technically successful placement of left 14 French pigtail chest drain, placed to Pleur-evac water-seal.   07/08/2018 Procedure   1. Technically successful five [place] double lumen power injectable PICC placement   07/09/2018 Imaging   Ct chest 1. There is a moderate, loculated left hydropneumothorax with a small air component and moderate fluid component. The largest loculated component is located posteriorly. There is a pigtail drainage catheter about the lateral pleural space. There is no obvious etiology, such as obvious  mass or pleural disease.   2. There is a small right pleural effusion with associated atelectasis or consolidation and a subpleural consolidation of the superior segment right lower lobe (series 4, image 56), of uncertain significance, possibly infectious or inflammatory   07/10/2018 Imaging   Ct abdomen and pelvis: 1. The bilateral ovaries are enlarged by heterogeneous appearing cystic lesions, measuring 5.3 x 4.2 cm on the right (series 4, image 72) and 4.5 x 3.2 cm on the left (series 4, image 75). Consider dedicated pelvic ultrasound and/or pelvic MRI to further evaluate for solid components given high suspicion for GYN primary malignancy.   2. No other evidence of mass and no lymphadenopathy in the abdomen or pelvis.   3. Trace ascites. There is some suggestion of omental and peritoneal nodularity (e.g. Series 4, image 39), concerning for peritoneal metastatic disease.    4. Loculated left-sided pleural effusion with left-sided pleural drainage catheter in position. Small right pleural effusion   07/13/2018 Surgery   OPERATION: 1.  Left VATS (video-assisted thoracoscopic surgery) for drainage of loculated pleural effusion. 2.  Talc pleurodesis for malignant pleural effusion. 3.  Placement of PleurX catheter for management of malignant pleural effusion. 4.  Placement of On-Q analgesia catheter system.    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   07/13/2018 Pathology Results   Pleura, peel, Left Pleural - FIBRO-FIBRINOUS PLEURITIS - NEGATIVE FOR MALIGNANCY   07/18/2018 Initial Diagnosis   Ovarian cancer, bilateral ([facility])   07/20/2018 Procedure   EGD impression: Normal proximal esophagus and mid esophagus. Mild distal esophageal  rings; dilation not performed because of esophagitis. LA Grade C  reflux esophagitis. Z-line regular, 30 cm from the incisors. 5 cm hiatal hernia. Non-bleeding gastric ulcer with no stigmata of bleeding. Gastritis. Duodenal erosions without bleeding. Normal second portion of the duodenum. No specimens collected.   08/19/2018 Genetic Testing   Negative genetic testing on the common hereditary cancer panel.  The Common Hereditary Gene Panel offered by [facility] includes sequencing and/or deletion duplication testing of the following 48 genes: APC, ATM, AXIN2, BARD1, BMPR1A, BRCA1, BRCA2, BRIP1, CDH1, CDK4, CDKN2A (p14ARF), CDKN2A (p16INK4a), CHEK2, CTNNA1, DICER1, EPCAM (Deletion/duplication testing only), GREM1 (promoter region deletion/duplication testing only), KIT, MEN1, MLH1, MSH2, MSH3, MSH6, MUTYH, NBN, NF1, NHTL1, PALB2, PDGFRA, PMS2, POLD1, POLE, PTEN, RAD50, RAD51C, RAD51D, RNF43, SDHB, SDHC, SDHD, SMAD4, SMARCA4. STK11, TP53, TSC1, TSC2, and VHL.  The following genes were evaluated for sequence changes only: SDHA and HOXB13 c.251G>A variant only. The report date is August 19, 2018.   08/24/2018 -  Chemotherapy   The patient had palonosetron (ALOXI) injection 0.25 mg, 0.25 mg, Intravenous,  Once, 7 of 7 cycles Administration: 0.25 mg (08/24/2018), 0.25 mg (09/14/2018), 0.25 mg (10/05/2018), 0.25 mg (12/01/2018), 0.25 mg (01/27/2019), 0.25 mg (02/22/2019), 0.25 mg (03/15/2019) CARBOplatin (PARAPLATIN) 740 mg in sodium chloride 0.9 % 250 mL chemo infusion, 740 mg (100 % of original dose 742.8 mg), Intravenous,  Once, 7 of 7 cycles Dose modification:   (original dose 742.8 mg, Cycle 1),   (original dose 736.8 mg, Cycle 2),   (original dose 736.8 mg, Cycle 3),   (original dose 736.8 mg, Cycle 4),   (original dose 736.8 mg, Cycle 5),   (original dose 736.8 mg, Cycle 6),   (original dose 736.8 mg, Cycle 7) Administration: 740 mg (08/24/2018), 740 mg (09/14/2018), 740 mg (10/05/2018), 740 mg (12/01/2018), 740 mg (01/27/2019), 740 mg (02/22/2019), 740 mg (03/15/2019) PACLitaxel  (TAXOL) 324 mg in sodium chloride 0.9 % 500 mL chemo infusion (> $RemoveBef'80mg'gxzvYsbram$ /m2), 175 mg/m2 = 324 mg, Intravenous,  Once, 7 of 7 cycles Administration: 324 mg (08/24/2018), 324 mg (09/14/2018), 324 mg (10/05/2018), 324 mg (12/01/2018), 324 mg (01/27/2019), 324 mg (02/22/2019), 324 mg (03/15/2019) fosaprepitant (EMEND) 150 mg, dexamethasone (DECADRON) 12 mg in sodium chloride 0.9 % 145 mL IVPB, , Intravenous,  Once, 7 of 7 cycles Administration:  (08/24/2018),  (09/14/2018),  (10/05/2018),  (12/01/2018),  (01/27/2019),  (02/22/2019),  (03/15/2019)   for chemotherapy treatment.       CANCER STAGING: Cancer Staging No matching staging information was found for the patient.  INTERVAL HISTORY:  [name], a 50 y.o. female, returns for routine follow-up of her high-grade serous ovarian cancer. Michelle Holt was last seen on 01/26/2020.   Today she reports feeling good. She reports severe fatigue over the past which worsened over the past 3 weeks, and she denies any current bleeding. She reports dark and black stools for the past 3 weeks. She did not feel any improvement following her last infusion of feraheme. She reports continued tingling in her hands and feet as well as pain in her legs and back. She reports occasional abdominal cramping that she reports feels like menstrual cramping.   REVIEW OF SYSTEMS:  Review of Systems  Constitutional:  Positive for fatigue (depleted). Negative for appetite change (60%).  HENT:   Negative for nosebleeds.   Respiratory:  Negative for hemoptysis.   Gastrointestinal:  Positive for abdominal pain (cramping). Negative for blood in stool.  Genitourinary:  Negative for hematuria.   Musculoskeletal:  Positive  for back pain and myalgias (6/10).  Neurological:  Positive for headaches and numbness.  Hematological:  Does not bruise/bleed easily.  Psychiatric/Behavioral:  Positive for sleep disturbance.   All other systems reviewed and are negative.  PAST MEDICAL/SURGICAL HISTORY:   Past Medical History:  Diagnosis Date   Anemia    Anxiety and depression    Arthritis of facet joints at multiple vertebral levels    L5-S1   Constipation    Dyslipidemia    Family history of breast cancer    Family history of uterine cancer    GERD (gastroesophageal reflux disease)    History of hiatal hernia    History of kidney stones    Insomnia    Irritable bowel syndrome    Migraine    Muscle tension headache    Neuropathy of finger    Ovarian carcinoma (HCC)    ovarian   Plantar fasciitis of right foot    Port-A-Cath in place 08/20/2018   Past Surgical History:  Procedure Laterality Date   ABDOMINAL HYSTERECTOMY     CHOLECYSTECTOMY  2008   COLONOSCOPY N/A 08/13/2013   Procedure: COLONOSCOPY;  Surgeon: Rogene Houston, MD;  [facility];  Service: Endoscopy;  Laterality: N/A;  230-moved to 145 Ann to notify pt   ESOPHAGOGASTRODUODENOSCOPY     ESOPHAGOGASTRODUODENOSCOPY (EGD) WITH PROPOFOL N/A 07/20/2018   Procedure: ESOPHAGOGASTRODUODENOSCOPY (EGD) WITH PROPOFOL;  Surgeon: Rogene Houston, MD;  [facility];  Service: Endoscopy;  Laterality: N/A;  Possible esophageal dilation.   IR ANGIOGRAM SELECTIVE EACH ADDITIONAL VESSEL  08/01/2018   IR ANGIOGRAM VISCERAL SELECTIVE  08/01/2018   IR EMBO ART  VEN HEMORR LYMPH EXTRAV  INC GUIDE ROADMAPPING  08/01/2018   IR IMAGING GUIDED PORT INSERTION  08/20/2018   IR PERC PLEURAL DRAIN W/INDWELL CATH W/IMG GUIDE  07/08/2018   IR THORACENTESIS ASP PLEURAL SPACE W/IMG GUIDE  07/07/2018   IR US GUIDE VASC ACCESS RIGHT  08/01/2018   PLEURAL EFFUSION DRAINAGE Left 07/13/2018   Procedure: DRAINAGE OF LOCULATED PLEURAL EFFUSION;  Surgeon: Ivin Poot, MD;  Location: [facility];  Service: Thoracic;  Laterality: Left;   REMOVAL OF PLEURAL DRAINAGE CATHETER Left 08/20/2018   Procedure: REMOVAL OF PLEURAL DRAINAGE CATHETER;  Surgeon: Ivin Poot, MD;  Location: [facility];  Service: Thoracic;  Laterality: Left;   REMOVAL OF PLEURAL  DRAINAGE CATHETER Left 08/20/2018   Procedure: REMOVAL OF PLEURAL DRAINAGE CATHETER;  Surgeon: Ivin Poot, MD;  Location: [facility];  Service: Thoracic;  Laterality: Left;   TALC PLEURODESIS Left 07/13/2018   Procedure: Talc Pleuradesis;  Surgeon: Prescott Gum, Collier Salina, MD;  Location: [facility];  Service: Thoracic;  Laterality: Left;   TOTAL HIP ARTHROPLASTY Left 06/05/2020   Procedure: LEFT TOTAL HIP ARTHROPLASTY ANTERIOR APPROACH;  Surgeon: Leandrew Koyanagi, MD;  Location: [facility];  Service: Orthopedics;  Laterality: Left;  3-C   TUBAL LIGATION Bilateral    UTERINE ABLATION     VIDEO ASSISTED THORACOSCOPY Left 07/13/2018   Procedure: VIDEO ASSISTED THORACOSCOPY;  Surgeon: Ivin Poot, MD;  Location[facility];  Service: Thoracic;  Laterality: Left;    SOCIAL HISTORY:  Social History   Socioeconomic History   Marital status: Widowed    Spouse name: Not on file   Number of children: 2   Years of education: 2-College   Highest education level: Not on file  Occupational History    Employer: [employer]  Tobacco Use   Smoking status: Former  Packs/day: 0.50    Years: 17.00    Pack years: 8.50    Types: Cigarettes    Quit date: 06/22/2018    Years since quitting: 2.3   Smokeless tobacco: Never  Vaping Use   Vaping Use: Never used  Substance and Sexual Activity   Alcohol use: Yes    Alcohol/week: 1.0 standard drink    Types: 1 Glasses of wine per week    Comment: Drinks alcohol on occasion   Drug use: No   Sexual activity: Not on file  Other Topics Concern   Not on file  Social History Narrative   Patient lives at home with her daughter.    Patient has 2 children.    Patient is widowed.    Patient is right handed.    Patient has her Associates degree.      Social Determinants of Health   Financial Resource Strain: Not on file  Food Insecurity: Not on file  Transportation Needs: Not on file  Physical Activity: Not on file  Stress: Not on file  Social Connections: Not on file  Intimate  Partner Violence: Not on file    FAMILY HISTORY:  Family History  Problem Relation Age of Onset   Hypertension Mother    Obesity Mother    Diabetes Mother    Kidney disease Mother    Peripheral vascular disease Father    Atrial fibrillation Father    COPD Brother    Osteoporosis Brother    Crohn's disease Sister    Uterine cancer Sister 75       maternal half sister   Breast cancer Maternal Aunt 33   Colon cancer Neg Hx     CURRENT MEDICATIONS:  Current Outpatient Medications  Medication Sig Dispense Refill   albuterol (VENTOLIN HFA) 108 (90 Base) MCG/ACT inhaler Inhale 2 puffs into the lungs every 6 (six) hours as needed for wheezing or shortness of breath.     Ascorbic Acid (VITAMIN C) 1000 MG tablet Take 2,000 mg by mouth daily.     aspirin EC 81 MG tablet Take 1 tablet (81 mg total) by mouth 2 (two) times daily. 84 tablet 0   B Complex-C (B-COMPLEX WITH VITAMIN C) tablet Take 1 tablet by mouth daily.     Cholecalciferol (VITAMIN D) 50 MCG (2000 UT) CAPS Take 2,000 Units by mouth daily.     diazepam (VALIUM) 5 MG tablet TAKE ONE TABLET BY MOUTH AT BEDTIME (Patient taking differently: Take 5 mg by mouth at bedtime.) 30 tablet 5   docusate sodium (COLACE) 100 MG capsule Take 1 capsule (100 mg total) by mouth daily as needed. To be taken after surgery 30 capsule 2   gabapentin (NEURONTIN) 100 MG capsule TAKE TWO (2) CAPSULES BY MOUTH AT BEDTIME 60 capsule 2   lidocaine (LIDODERM) 5 % Place 1 patch onto the skin daily. Remove & Discard patch within 12 hours or as directed by MD 30 patch 0   lidocaine-prilocaine (EMLA) cream Apply small amount to port a cath site and cover with plastic wrap one hour prior to chemotherapy appointments 30 g 3   Magnesium 500 MG TABS Take 500 mg by mouth in the morning.     ondansetron (ZOFRAN) 4 MG tablet Take 1 tablet (4 mg total) by mouth every 8 (eight) hours as needed for nausea or vomiting. 40 tablet 0   Oxycodone HCl 10 MG TABS Take 1 tablet (10  mg total) by mouth every 4 (four) hours as needed.  168 tablet 0   oxyCODONE-acetaminophen (PERCOCET) 7.5-325 MG tablet Take 1-2 tabs every 6 hours prn pain.  To be started as needed after surgery (Patient taking differently: Take 1-2 tabs every 6 hours prn pain.  To be started as needed after surgery) 40 tablet 0   pantoprazole (PROTONIX) 40 MG tablet TAKE ONE TABLET BY MOUTH TWICE DAILY BEFORE A MEAL 60 tablet 2   sennosides-docusate sodium (SENOKOT-S) 8.6-50 MG tablet Take 1 tablet by mouth in the morning and at bedtime.     SUMAtriptan (IMITREX) 100 MG tablet TAKE ONE TABLET BY MOUTH PRN UP to TWICE DAILY AS NEEDED. 9 tablet 5   tiZANidine (ZANAFLEX) 4 MG tablet Take 1 tablet (4 mg total) by mouth 3 (three) times daily as needed for muscle spasms. 60 tablet 0   triamcinolone cream (KENALOG) 0.1 % Apply 1 application topically 2 (two) times daily as needed (rash). 80 g 11   venlafaxine XR (EFFEXOR-XR) 150 MG 24 hr capsule TAKE ONE CAPSULE BY MOUTH DAILY WITH BREAKFAST 30 capsule 6   No current facility-administered medications for this visit.    ALLERGIES:  Allergies  Allergen Reactions   Morphine And Related Itching   Nickel Itching   Nortriptyline Other (See Comments)    Significant weight gain   Topamax [Topiramate] Diarrhea and Nausea Only   Xanax [Alprazolam]     Can't wake up    Actifed Cold-Allergy [Chlorpheniramine-Phenyleph Er] Rash   Amoxicillin Rash    Did it involve swelling of the face/tongue/throat, SOB, or low BP? Unknown Did it involve sudden or severe rash/hives, skin peeling, or any reaction on the inside of your mouth or nose? Unknown Did you need to seek medical attention at a hospital or doctor's office? Unknown When did it last happen? teenager       If all above answers are "NO", may proceed with cephalosporin use.    Codeine Hives   Erythromycin Rash   Penicillins Rash    Did it involve swelling of the face/tongue/throat, SOB, or low BP? Unknown Did it  involve sudden or severe rash/hives, skin peeling, or any reaction on the inside of your mouth or nose? Unknown Did you need to seek medical attention at a hospital or doctor's office? Unknown When did it last happen? teenager       If all above answers are "NO", may proceed with cephalosporin use.    Sudafed [Pseudoephedrine Hcl] Rash    PHYSICAL EXAM:  Performance status (ECOG): 1 - Symptomatic but completely ambulatory  There were no vitals filed for this visit. Wt Readings from Last 3 Encounters:  10/06/20 183 lb 6.4 oz (83.2 kg)  06/05/20 180 lb (81.6 kg)  06/01/20 180 lb (81.6 kg)   Physical Exam Vitals reviewed.  Constitutional:      Appearance: Normal appearance.  Cardiovascular:     Rate and Rhythm: Normal rate and regular rhythm.     Pulses: Normal pulses.     Heart sounds: Normal heart sounds.  Pulmonary:     Effort: Pulmonary effort is normal.     Breath sounds: Normal breath sounds.  Abdominal:     Palpations: Abdomen is soft. There is no hepatomegaly, splenomegaly or mass.     Tenderness: There is no abdominal tenderness.  Neurological:     General: No focal deficit present.     Mental Status: She is alert and oriented to person, place, and time.  Psychiatric:        Mood and Affect:  Mood normal.        Behavior: Behavior normal.     LABORATORY DATA:  I have reviewed the labs as listed.  CBC Latest Ref Rng & Units 10/11/2020 10/05/2020 07/07/2020  WBC 4.0 - 10.5 K/uL 8.0 6.8 7.2  Hemoglobin 12.0 - 15.0 g/dL 9.5(L) 6.7(LL) 12.7  Hematocrit 36.0 - 46.0 % 31.7(L) 22.4(L) 39.9  Platelets 150 - 400 K/uL 466(H) 520(H) 432(H)   CMP Latest Ref Rng & Units 10/05/2020 07/07/2020 06/06/2020  Glucose 70 - 99 mg/dL 99 91 135(H)  BUN 6 - 20 mg/dL _0 Creatinine 0.44 - 1.00 mg/dL 0.55 0.61 0.64  Sodium 135 - 145 mmol/L 138 139 137  Potassium 3.5 - 5.1 mmol/L 4.2 3.7 3.9  Chloride 98 - 111 mmol/L 105 103 105  CO2 22 - 32 mmol/L _1 Calcium 8.9 - 10.3 mg/dL  8.8(L) 9.4 9.2  Total Protein 6.5 - 8.1 g/dL 6.7 6.9 -  Total Bilirubin 0.3 - 1.2 mg/dL 0.2(L) 0.5 -  Alkaline Phos 38 - 126 U/L 88 130(H) -  AST 15 - 41 U/L 13(L) 28 -  ALT 0 - 44 U/L 10 20 -    DIAGNOSTIC IMAGING:  I have independently reviewed the scans and discussed with the patient. No results found.   ASSESSMENT:  1.  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 -Robotic assisted laparoscopic TAH and BSO and omentectomy on 12/24/2018, pathology showing high-grade serous carcinoma, PT3P NX. -Germline mutation testing was negative. -3 more cycles of adjuvant chemotherapy completed on 03/15/2019. -CTAP on 04/13/2019 showed no findings of active malignancy.  28% reduction in the volume of presumed chronic hematoma/chronic fluid collection splaying the upper margin of the spleen.  Large type III hiatal hernia. -CTAP on 10/13/2019 shows no findings of recurrence or metastatic disease.   PLAN:  1.  Clinical stage IVa high-grade serous ovarian cancer, positive cytology of left pleural effusion: - She does not report any major abdominal pains.  Left upper quadrant pain is stable. - Last CT abdomen and pelvis on 07/10/2020 with no findings of recurrence or metastatic disease. - CA125 on 10/05/2020 was 36.5, still in the normal range although trending up last 2 times.  LFTs are within normal limits. - We will plan to repeat another tumor marker in 6 to 8 weeks.  If it continues to go high, will consider imaging.   2.  Generalized pain/left upper quadrant pain: - Continue oxycodone as needed.   3.  Peripheral neuropathy: -She has tingling and numbness in the hands and feet. - Continue gabapentin at bedtime.   4.  Hot flashes: - Continue Effexor 150 mg daily.   5.  Severe normocytic anemia: - CBC on 10/05/2020 showed hemoglobin 6.7 with MCV 94.5.  Ferritin was 10  and percent saturation was 2. - She received 1 unit of blood and 1 infusion of iron on 10/05/2020. - We have done stool cards on 3 different days, 2 of which were positive for occult blood. - He also reported dark stools coinciding with onset of severe fatigue since last 3 weeks.  She reported abdominal cramping during that time.  She denies any bright red blood per rectum. - Previous EGD in July 2020 done by Dr. Laural Golden showed gastric ulcer. - We will give her 2 more infusions of Feraheme. - We will ask Dr. Laural Golden for his opinion on repeating  EGD/colonoscopy. - We will plan to repeat ferritin, iron panel and CBC in 6 weeks.   Orders placed this encounter:  No orders of the defined types were placed in this encounter.    Derek Jack, MD [place] [phone]   I, Michelle Holt, am acting as a scribe for Dr. Derek Jack.  I, Derek Jack MD, have reviewed the above documentation for accuracy and completeness, and I agree with the above.

## 2020-10-11 NOTE — Patient Instructions (Signed)
[facility]  Discharge Instructions: Thank you for choosing [facility] to provide your oncology and hematology care.  If you have a lab appointment with the Cancer Center, please come in thru the Main Entrance and check in at the main information desk.  Wear comfortable clothing and clothing appropriate for easy access to any Portacath or PICC line.   We strive to give you quality time with your provider. You may need to reschedule your appointment if you arrive late (15 or more minutes).  Arriving late affects you and other patients whose appointments are after yours.  Also, if you miss three or more appointments without notifying the office, you may be dismissed from the clinic at the provider's discretion.      For prescription refill requests, have your pharmacy contact our office and allow 72 hours for refills to be completed.    Today your port was flushed and labs were drawn, return as scheduled.   To help prevent nausea and vomiting after your treatment, we encourage you to take your nausea medication as directed.  BELOW ARE SYMPTOMS THAT SHOULD BE REPORTED IMMEDIATELY: *FEVER GREATER THAN 100.4 F (38 C) OR HIGHER *CHILLS OR SWEATING *NAUSEA AND VOMITING THAT IS NOT CONTROLLED WITH YOUR NAUSEA MEDICATION *UNUSUAL SHORTNESS OF BREATH *UNUSUAL BRUISING OR BLEEDING *URINARY PROBLEMS (pain or burning when urinating, or frequent urination) *BOWEL PROBLEMS (unusual diarrhea, constipation, pain near the anus) TENDERNESS IN MOUTH AND THROAT WITH OR WITHOUT PRESENCE OF ULCERS (sore throat, sores in mouth, or a toothache) UNUSUAL RASH, SWELLING OR PAIN  UNUSUAL VAGINAL DISCHARGE OR ITCHING   Items with * indicate a potential emergency and should be followed up as soon as possible or go to the Emergency Department if any problems should occur.  Please show the CHEMOTHERAPY ALERT CARD or IMMUNOTHERAPY ALERT CARD at check-in to the Emergency Department and triage  nurse.  Should you have questions after your visit or need to cancel or reschedule your appointment, please contact [facility] [phone]  and follow the prompts.  Office hours are 8:00 a.m. to 4:30 p.m. Monday - Friday. Please note that voicemails left after 4:00 p.m. may not be returned until the following business day.  We are closed weekends and major holidays. You have access to a nurse at all times for urgent questions. Please call the main number to the clinic [phone] and follow the prompts.  For any non-urgent questions, you may also contact your provider using MyChart. We now offer e-Visits for anyone 18 and older to request care online for non-urgent symptoms. For details visit [email or web address].   Also download the MyChart app! Go to the app store, search "MyChart", open the app, select [facility], and log in with your MyChart username and password.  Due to Covid, a mask is required upon entering the hospital/clinic. If you do not have a mask, one will be given to you upon arrival. For doctor visits, patients may have 1 support person aged 18 or older with them. For treatment visits, patients cannot have anyone with them due to current Covid guidelines and our immunocompromised population.  

## 2020-10-12 ENCOUNTER — Inpatient Hospital Stay (HOSPITAL_BASED_OUTPATIENT_CLINIC_OR_DEPARTMENT_OTHER): Payer: Medicare Other | Admitting: Hematology

## 2020-10-12 VITALS — BP 111/85 | HR 95 | Temp 98.8°F | Resp 18 | Wt 182.7 lb

## 2020-10-12 DIAGNOSIS — C563 Malignant neoplasm of bilateral ovaries: Secondary | ICD-10-CM | POA: Diagnosis not present

## 2020-10-12 DIAGNOSIS — D649 Anemia, unspecified: Secondary | ICD-10-CM | POA: Diagnosis not present

## 2020-10-12 NOTE — Patient Instructions (Addendum)
[place] [facility] Discharge Instructions  You were seen today by Dr. Delton Coombes. He went over your recent results. You will be scheduled for 2 infusions of IV Iron (Feraheme). You will be referred to Dr. Laural Golden for a colonoscopy and endoscopy. Dr. Delton Coombes will see you back in 6 weeks for labs and follow up.   Thank you for choosing [place] at [facility] to provide your oncology and hematology care.  To afford each patient quality time with our provider, please arrive at least 15 minutes before your scheduled appointment time.   If you have a lab appointment with the [facility] please come in thru the Main Entrance and check in at the main information desk  You need to re-schedule your appointment should you arrive 10 or more minutes late.  We strive to give you quality time with our providers, and arriving late affects you and other patients whose appointments are after yours.  Also, if you no show three or more times for appointments you may be dismissed from the clinic at the providers discretion.     Again, thank you for choosing [facility].  Our hope is that these requests will decrease the amount of time that you wait before being seen by our physicians.       _____________________________________________________________  Should you have questions after your visit to [facility], please contact our office at [phone] between the hours of 8:00 a.m. and 4:30 p.m.  Voicemails left after 4:00 p.m. will not be returned until the following business day.  For prescription refill requests, have your pharmacy contact our office and allow 72 hours.    Cancer Center Support Programs:   > Cancer Support Group  2nd Tuesday of the month 1pm-2pm, Journey Room

## 2020-10-16 ENCOUNTER — Encounter (INDEPENDENT_AMBULATORY_CARE_PROVIDER_SITE_OTHER): Payer: Self-pay

## 2020-10-16 ENCOUNTER — Encounter (INDEPENDENT_AMBULATORY_CARE_PROVIDER_SITE_OTHER): Payer: Self-pay | Admitting: *Deleted

## 2020-10-16 ENCOUNTER — Other Ambulatory Visit (HOSPITAL_COMMUNITY): Payer: Self-pay

## 2020-10-16 ENCOUNTER — Other Ambulatory Visit: Payer: Self-pay

## 2020-10-16 ENCOUNTER — Inpatient Hospital Stay (HOSPITAL_COMMUNITY): Payer: Medicare Other | Attending: Hematology

## 2020-10-16 ENCOUNTER — Other Ambulatory Visit (INDEPENDENT_AMBULATORY_CARE_PROVIDER_SITE_OTHER): Payer: Self-pay

## 2020-10-16 VITALS — BP 116/61 | HR 73 | Temp 96.8°F | Resp 17

## 2020-10-16 DIAGNOSIS — C563 Malignant neoplasm of bilateral ovaries: Secondary | ICD-10-CM

## 2020-10-16 DIAGNOSIS — K921 Melena: Secondary | ICD-10-CM

## 2020-10-16 DIAGNOSIS — D649 Anemia, unspecified: Secondary | ICD-10-CM | POA: Diagnosis present

## 2020-10-16 MED ORDER — OXYCODONE HCL 10 MG PO TABS: 10.0000 mg | ORAL_TABLET | ORAL | 0 refills | Status: DC | PRN

## 2020-10-16 MED ORDER — SODIUM CHLORIDE 0.9 % IV SOLN
510.0000 mg | Freq: Once | INTRAVENOUS | Status: AC
  Administered 2020-10-16: 510 mg via INTRAVENOUS
  Filled 2020-10-16: qty 17

## 2020-10-16 MED ORDER — SODIUM CHLORIDE 0.9% FLUSH
10.0000 mL | INTRAVENOUS | Status: DC | PRN
  Administered 2020-10-16: 10 mL via INTRAVENOUS

## 2020-10-16 MED ORDER — ACETAMINOPHEN 325 MG PO TABS
650.0000 mg | ORAL_TABLET | Freq: Once | ORAL | Status: AC
  Administered 2020-10-16: 650 mg via ORAL
  Filled 2020-10-16: qty 2

## 2020-10-16 MED ORDER — FAMOTIDINE 20 MG PO TABS
20.0000 mg | ORAL_TABLET | Freq: Once | ORAL | Status: AC
  Administered 2020-10-16: 20 mg via ORAL
  Filled 2020-10-16: qty 1

## 2020-10-16 MED ORDER — HEPARIN SOD (PORK) LOCK FLUSH 100 UNIT/ML IV SOLN
250.0000 [IU] | Freq: Once | INTRAVENOUS | Status: AC | PRN
  Administered 2020-10-16: 250 [IU]

## 2020-10-16 MED ORDER — LORATADINE 10 MG PO TABS
10.0000 mg | ORAL_TABLET | Freq: Once | ORAL | Status: AC
  Administered 2020-10-16: 10 mg via ORAL
  Filled 2020-10-16: qty 1

## 2020-10-16 MED ORDER — SODIUM CHLORIDE 0.9 % IV SOLN: INTRAVENOUS | Status: DC

## 2020-10-16 MED ORDER — SODIUM CHLORIDE 0.9% FLUSH
10.0000 mL | Freq: Once | INTRAVENOUS | Status: AC | PRN
  Administered 2020-10-16: 10 mL

## 2020-10-16 NOTE — Progress Notes (Signed)
Patient presents today for iron infusion.  Patient is in satisfactory condition and vital signs are stable.  We will proceed with treatment per MD orders.   Patient tolerated treatment well with no complaints voiced.  Patient left ambulatory in stable condition.  Vital signs stable at discharge.  Follow up as scheduled.

## 2020-10-16 NOTE — Patient Instructions (Signed)
[facility]  Discharge Instructions: Thank you for choosing [facility] to provide your oncology and hematology care.  If you have a lab appointment with the Cancer Center, please come in thru the Main Entrance and check in at the main information desk.  Wear comfortable clothing and clothing appropriate for easy access to any Portacath or PICC line.   We strive to give you quality time with your provider. You may need to reschedule your appointment if you arrive late (15 or more minutes).  Arriving late affects you and other patients whose appointments are after yours.  Also, if you miss three or more appointments without notifying the office, you may be dismissed from the clinic at the provider's discretion.      For prescription refill requests, have your pharmacy contact our office and allow 72 hours for refills to be completed.        To help prevent nausea and vomiting after your treatment, we encourage you to take your nausea medication as directed.  BELOW ARE SYMPTOMS THAT SHOULD BE REPORTED IMMEDIATELY: *FEVER GREATER THAN 100.4 F (38 C) OR HIGHER *CHILLS OR SWEATING *NAUSEA AND VOMITING THAT IS NOT CONTROLLED WITH YOUR NAUSEA MEDICATION *UNUSUAL SHORTNESS OF BREATH *UNUSUAL BRUISING OR BLEEDING *URINARY PROBLEMS (pain or burning when urinating, or frequent urination) *BOWEL PROBLEMS (unusual diarrhea, constipation, pain near the anus) TENDERNESS IN MOUTH AND THROAT WITH OR WITHOUT PRESENCE OF ULCERS (sore throat, sores in mouth, or a toothache) UNUSUAL RASH, SWELLING OR PAIN  UNUSUAL VAGINAL DISCHARGE OR ITCHING   Items with * indicate a potential emergency and should be followed up as soon as possible or go to the Emergency Department if any problems should occur.  Please show the CHEMOTHERAPY ALERT CARD or IMMUNOTHERAPY ALERT CARD at check-in to the Emergency Department and triage nurse.  Should you have questions after your visit or need to cancel  or reschedule your appointment, please contact [facility] [phone]  and follow the prompts.  Office hours are 8:00 a.m. to 4:30 p.m. Monday - Friday. Please note that voicemails left after 4:00 p.m. may not be returned until the following business day.  We are closed weekends and major holidays. You have access to a nurse at all times for urgent questions. Please call the main number to the clinic [phone] and follow the prompts.  For any non-urgent questions, you may also contact your provider using MyChart. We now offer e-Visits for anyone 18 and older to request care online for non-urgent symptoms. For details visit [email or web address].   Also download the MyChart app! Go to the app store, search "MyChart", open the app, select [facility], and log in with your MyChart username and password.  Due to Covid, a mask is required upon entering the hospital/clinic. If you do not have a mask, one will be given to you upon arrival. For doctor visits, patients may have 1 support person aged 18 or older with them. For treatment visits, patients cannot have anyone with them due to current Covid guidelines and our immunocompromised population.  

## 2020-10-17 ENCOUNTER — Encounter (HOSPITAL_COMMUNITY): Payer: Self-pay | Admitting: Hematology

## 2020-10-18 ENCOUNTER — Encounter (HOSPITAL_COMMUNITY): Payer: Self-pay

## 2020-10-18 ENCOUNTER — Ambulatory Visit (HOSPITAL_COMMUNITY): Admit: 2020-10-18 | Payer: Medicare Other | Admitting: Gastroenterology

## 2020-10-18 SURGERY — ESOPHAGOGASTRODUODENOSCOPY (EGD) WITH PROPOFOL
Anesthesia: Monitor Anesthesia Care

## 2020-10-19 ENCOUNTER — Other Ambulatory Visit (INDEPENDENT_AMBULATORY_CARE_PROVIDER_SITE_OTHER): Payer: Self-pay

## 2020-10-19 ENCOUNTER — Ambulatory Visit (HOSPITAL_COMMUNITY): Admission: RE | Admit: 2020-10-19 | Payer: Medicare Other | Source: Home / Self Care | Admitting: Internal Medicine

## 2020-10-19 ENCOUNTER — Encounter (HOSPITAL_COMMUNITY): Admission: RE | Payer: Self-pay | Source: Home / Self Care

## 2020-10-19 ENCOUNTER — Encounter (HOSPITAL_COMMUNITY): Payer: Self-pay | Admitting: Anesthesiology

## 2020-10-19 DIAGNOSIS — K921 Melena: Secondary | ICD-10-CM

## 2020-10-19 SURGERY — EGD (ESOPHAGOGASTRODUODENOSCOPY)
Anesthesia: Moderate Sedation

## 2020-10-20 NOTE — Patient Instructions (Signed)
20    Your procedure is scheduled on: 10/27/2020  Report to [facility] entrance at     12:15 PM.  Call this number if you have problems the morning of surgery: [phone]   Remember:   Follow instructions on letter from office regarding when to stop eating and drinking        No Smoking the day of procedure      Take these medicines the morning of surgery with A SIP OF WATER: Pantoprazole     (Zofran, Oxycodone, zanaflex,  imitrex and /or use inhaler if needed)   Do not wear jewelry, make-up or nail polish.  Do not wear lotions, powders, or perfumes. You may wear deodorant.                Do not bring valuables to the hospital.  Contacts, dentures or bridgework may not be worn into surgery.  Leave suitcase in the car. After surgery it may be brought to your room.  For patients admitted to the hospital, checkout time is 11:00 AM the day of discharge.   Patients discharged the day of surgery will not be allowed to drive home. Upper Endoscopy, Adult Upper endoscopy is a procedure to look inside the upper GI (gastrointestinal) tract. The upper GI tract is made up of: The part of the body that moves food from your mouth to your stomach (esophagus). The stomach. The first part of your small intestine (duodenum). This procedure is also called esophagogastroduodenoscopy (EGD) or gastroscopy. In this procedure, your health care provider passes a thin, flexible tube (endoscope) through your mouth and down your esophagus into your stomach. A small camera is attached to the end of the tube. Images from the camera appear on a monitor in the exam room. During this procedure, your health care provider may also remove a small piece of tissue to be sent to a lab and examined under a microscope (biopsy). Your health care provider may do an upper endoscopy to diagnose cancers of the upper GI tract. You may also have this procedure to find the cause of other conditions, such as: Stomach  pain. Heartburn. Pain or problems when swallowing. Nausea and vomiting. Stomach bleeding. Stomach ulcers. Tell a health care provider about: Any allergies you have. All medicines you are taking, including vitamins, herbs, eye drops, creams, and over-the-counter medicines. Any problems you or family members have had with anesthetic medicines. Any blood disorders you have. Any surgeries you have had. Any medical conditions you have. Whether you are pregnant or may be pregnant. What are the risks? Generally, this is a safe procedure. However, problems may occur, including: Infection. Bleeding. Allergic reactions to medicines. A tear or hole (perforation) in the esophagus, stomach, or duodenum. What happens before the procedure? Staying hydrated Follow instructions from your health care provider about hydration, which may include: Up to 4 hours before the procedure - you may continue to drink clear liquids, such as water, clear fruit juice, black coffee, and plain tea.   Medicines Ask your health care provider about: Changing or stopping your regular medicines. This is especially important if you are taking diabetes medicines or blood thinners. Taking medicines such as aspirin and ibuprofen. These medicines can thin your blood. Do not take these medicines unless your health care provider tells you to take them. Taking over-the-counter medicines, vitamins, herbs, and supplements. General instructions Plan to have someone take you home from the hospital or clinic. If you will be going home right  after the procedure, plan to have someone with you for 24 hours. Ask your health care provider what steps will be taken to help prevent infection. What happens during the procedure?  An IV will be inserted into one of your veins. You may be given one or more of the following: A medicine to help you relax (sedative). A medicine to numb the throat (local anesthetic). You will lie on your left  side on an exam table. Your health care provider will pass the endoscope through your mouth and down your esophagus. Your health care provider will use the scope to check the inside of your esophagus, stomach, and duodenum. Biopsies may be taken. The endoscope will be removed. The procedure may vary among health care providers and hospitals. What happens after the procedure? Your blood pressure, heart rate, breathing rate, and blood oxygen level will be monitored until you leave the hospital or clinic. Do not drive for 24 hours if you were given a sedative during your procedure. When your throat is no longer numb, you may be given some fluids to drink. It is up to you to get the results of your procedure. Ask your health care provider, or the department that is doing the procedure, when your results will be ready. Summary Upper endoscopy is a procedure to look inside the upper GI tract. During the procedure, an IV will be inserted into one of your veins. You may be given a medicine to help you relax. A medicine will be used to numb your throat. The endoscope will be passed through your mouth and down your esophagus. This information is not intended to replace advice given to you by your health care provider. Make sure you discuss any questions you have with your health care provider. Document Revised: 06/25/2017 Document Reviewed: 06/02/2017 Elsevier Patient Education  [place] [facility]  Please read the instructions outlined below and refer to this sheet in the next few weeks. These discharge instructions provide you with general information on caring for yourself after you leave the hospital. Your doctor may also give you specific instructions. While your treatment has been planned according to the most current medical practices available,  unavoidable complications occasionally occur. If you have any problems or questions after discharge, please call your doctor. HOME CARE INSTRUCTIONS Activity You may resume your regular activity but move at a slower pace for the next 24 hours.  Take frequent rest periods for the next 24 hours.  Walking will help expel (get rid of) the air and reduce the bloated feeling in your abdomen.  No driving for 24 hours (because of the anesthesia (medicine) used during the test).  You may shower.  Do not sign any important  legal documents or operate any machinery for 24 hours (because of the anesthesia used during the test).  Nutrition Drink plenty of fluids.  You may resume your normal diet.  Begin with a light meal and progress to your normal diet.  Avoid alcoholic beverages for 24 hours or as instructed by your caregiver.  Medications You may resume your normal medications unless your caregiver tells you otherwise. What you can expect today You may experience abdominal discomfort such as a feeling of fullness or "gas" pains.  You may experience a sore throat for 2 to 3 days. This is normal. Gargling with salt water may help this.  Follow-up Your doctor will discuss the results of your test with you. SEEK IMMEDIATE MEDICAL CARE IF: You have excessive nausea (feeling sick to your stomach) and/or vomiting.  You have severe abdominal pain and distention (swelling).  You have trouble swallowing.  You have a temperature over 100 F (37.8 C).  You have rectal bleeding or vomiting of blood.  Document Released: 08/15/2003 Document Revised: 12/20/2010 Document Reviewed: 02/25/2007

## 2020-10-23 ENCOUNTER — Other Ambulatory Visit (HOSPITAL_COMMUNITY): Payer: Self-pay | Admitting: *Deleted

## 2020-10-23 ENCOUNTER — Other Ambulatory Visit: Payer: Self-pay

## 2020-10-23 ENCOUNTER — Inpatient Hospital Stay (HOSPITAL_COMMUNITY): Payer: Medicare Other

## 2020-10-23 VITALS — BP 124/66 | HR 72 | Temp 97.0°F | Resp 18

## 2020-10-23 DIAGNOSIS — C563 Malignant neoplasm of bilateral ovaries: Secondary | ICD-10-CM

## 2020-10-23 DIAGNOSIS — D649 Anemia, unspecified: Secondary | ICD-10-CM | POA: Diagnosis not present

## 2020-10-23 MED ORDER — GABAPENTIN 100 MG PO CAPS: ORAL_CAPSULE | ORAL | 2 refills | Status: DC

## 2020-10-23 MED ORDER — FAMOTIDINE 20 MG PO TABS
20.0000 mg | ORAL_TABLET | Freq: Once | ORAL | Status: AC
  Administered 2020-10-23: 20 mg via ORAL
  Filled 2020-10-23: qty 1

## 2020-10-23 MED ORDER — HEPARIN SOD (PORK) LOCK FLUSH 100 UNIT/ML IV SOLN
500.0000 [IU] | Freq: Once | INTRAVENOUS | Status: AC | PRN
  Administered 2020-10-23: 500 [IU]

## 2020-10-23 MED ORDER — LORATADINE 10 MG PO TABS
10.0000 mg | ORAL_TABLET | Freq: Once | ORAL | Status: AC
  Administered 2020-10-23: 10 mg via ORAL
  Filled 2020-10-23: qty 1

## 2020-10-23 MED ORDER — SODIUM CHLORIDE 0.9 % IV SOLN
510.0000 mg | Freq: Once | INTRAVENOUS | Status: AC
  Administered 2020-10-23: 510 mg via INTRAVENOUS
  Filled 2020-10-23: qty 510

## 2020-10-23 MED ORDER — SODIUM CHLORIDE 0.9 % IV SOLN: INTRAVENOUS | Status: DC

## 2020-10-23 MED ORDER — SODIUM CHLORIDE 0.9% FLUSH
10.0000 mL | INTRAVENOUS | Status: AC | PRN
  Administered 2020-10-23: 10 mL via INTRAVENOUS

## 2020-10-23 NOTE — Progress Notes (Unsigned)
Pt presents today for Feraheme IV iron infusion per provider's order. Vital signs stable and pt voiced no new complaints at this time.  Feraheme given today per MD orders. Tolerated infusion without adverse affects. Vital signs stable. No complaints at this time. Discharged from clinic ambulatory in stable condition. Alert and oriented x 3. F/U with [facility] as scheduled.   

## 2020-10-23 NOTE — Patient Instructions (Signed)
[facility]  Discharge Instructions: Thank you for choosing [facility] to provide your oncology and hematology care.  If you have a lab appointment with the [facility], please come in thru the Main Entrance and check in at the main information desk.  Wear comfortable clothing and clothing appropriate for easy access to any Portacath or PICC line.   We strive to give you quality time with your provider. You may need to reschedule your appointment if you arrive late (15 or more minutes).  Arriving late affects you and other patients whose appointments are after yours.  Also, if you miss three or more appointments without notifying the office, you may be dismissed from the clinic at the provider's discretion.      For prescription refill requests, have your pharmacy contact our office and allow 72 hours for refills to be completed.    Today you received Feraheme IV iron per provider's order.     BELOW ARE SYMPTOMS THAT SHOULD BE REPORTED IMMEDIATELY: *FEVER GREATER THAN 100.4 F (38 C) OR HIGHER *CHILLS OR SWEATING *NAUSEA AND VOMITING THAT IS NOT CONTROLLED WITH YOUR NAUSEA MEDICATION *UNUSUAL SHORTNESS OF BREATH *UNUSUAL BRUISING OR BLEEDING *URINARY PROBLEMS (pain or burning when urinating, or frequent urination) *BOWEL PROBLEMS (unusual diarrhea, constipation, pain near the anus) TENDERNESS IN MOUTH AND THROAT WITH OR WITHOUT PRESENCE OF ULCERS (sore throat, sores in mouth, or a toothache) UNUSUAL RASH, SWELLING OR PAIN  UNUSUAL VAGINAL DISCHARGE OR ITCHING   Items with * indicate a potential emergency and should be followed up as soon as possible or go to the Emergency Department if any problems should occur.  Please show the CHEMOTHERAPY ALERT CARD or IMMUNOTHERAPY ALERT CARD at check-in to the Emergency Department and triage nurse.  Should you have questions after your visit or need to cancel or reschedule your appointment, please contact [facility] [phone]  and follow the prompts.  Office hours are 8:00 a.m. to 4:30 p.m. Monday - Friday. Please note that voicemails left after 4:00 p.m. may not be returned until the following business day.  We are closed weekends and major holidays. You have access to a nurse at all times for urgent questions. Please call the main number to the clinic [phone] and follow the prompts.  For any non-urgent questions, you may also contact your provider using MyChart. We now offer e-Visits for anyone 55 and older to request care online for non-urgent symptoms. For details visit [email or web address].   Also download the MyChart app! Go to the app store, search "MyChart", open the app, select [place], and log in with your MyChart username and password.  Due to Covid, a mask is required upon entering the hospital/clinic. If you do not have a mask, one will be given to you upon arrival. For doctor visits, patients may have 1 support person aged 71 or older with them. For treatment visits, patients cannot have anyone with them due to current Covid guidelines and our immunocompromised population.

## 2020-10-24 ENCOUNTER — Encounter (HOSPITAL_COMMUNITY)
Admission: RE | Admit: 2020-10-24 | Discharge: 2020-10-24 | Disposition: A | Discharge status: Home / Self Care | Payer: Medicare Other | Source: Ambulatory Visit | Attending: Gastroenterology | Admitting: Gastroenterology

## 2020-10-24 ENCOUNTER — Encounter (HOSPITAL_COMMUNITY): Payer: Self-pay

## 2020-10-24 DIAGNOSIS — K921 Melena: Secondary | ICD-10-CM

## 2020-10-27 ENCOUNTER — Ambulatory Visit (HOSPITAL_COMMUNITY): Payer: Medicare Other | Admitting: Anesthesiology

## 2020-10-27 ENCOUNTER — Encounter (HOSPITAL_COMMUNITY)
Admission: RE | Disposition: A | Discharge status: Home / Self Care | Payer: Self-pay | Source: Home / Self Care | Attending: Gastroenterology

## 2020-10-27 ENCOUNTER — Encounter (HOSPITAL_COMMUNITY): Payer: Self-pay | Admitting: Gastroenterology

## 2020-10-27 ENCOUNTER — Ambulatory Visit (HOSPITAL_COMMUNITY)
Admission: RE | Admit: 2020-10-27 | Discharge: 2020-10-27 | Disposition: A | Discharge status: Home / Self Care | Payer: Medicare Other | Attending: Gastroenterology | Admitting: Gastroenterology

## 2020-10-27 DIAGNOSIS — Z7982 Long term (current) use of aspirin: Secondary | ICD-10-CM | POA: Insufficient documentation

## 2020-10-27 DIAGNOSIS — F419 Anxiety disorder, unspecified: Secondary | ICD-10-CM | POA: Diagnosis not present

## 2020-10-27 DIAGNOSIS — Z885 Allergy status to narcotic agent status: Secondary | ICD-10-CM | POA: Diagnosis not present

## 2020-10-27 DIAGNOSIS — K2951 Unspecified chronic gastritis with bleeding: Secondary | ICD-10-CM | POA: Insufficient documentation

## 2020-10-27 DIAGNOSIS — K921 Melena: Secondary | ICD-10-CM

## 2020-10-27 DIAGNOSIS — Z96642 Presence of left artificial hip joint: Secondary | ICD-10-CM | POA: Insufficient documentation

## 2020-10-27 DIAGNOSIS — Z8379 Family history of other diseases of the digestive system: Secondary | ICD-10-CM | POA: Insufficient documentation

## 2020-10-27 DIAGNOSIS — F32A Depression, unspecified: Secondary | ICD-10-CM | POA: Diagnosis not present

## 2020-10-27 DIAGNOSIS — K259 Gastric ulcer, unspecified as acute or chronic, without hemorrhage or perforation: Secondary | ICD-10-CM | POA: Diagnosis not present

## 2020-10-27 DIAGNOSIS — Z853 Personal history of malignant neoplasm of breast: Secondary | ICD-10-CM | POA: Diagnosis not present

## 2020-10-27 DIAGNOSIS — Z8543 Personal history of malignant neoplasm of ovary: Secondary | ICD-10-CM | POA: Insufficient documentation

## 2020-10-27 DIAGNOSIS — K449 Diaphragmatic hernia without obstruction or gangrene: Secondary | ICD-10-CM | POA: Diagnosis not present

## 2020-10-27 DIAGNOSIS — Z881 Allergy status to other antibiotic agents status: Secondary | ICD-10-CM | POA: Diagnosis not present

## 2020-10-27 DIAGNOSIS — Z79899 Other long term (current) drug therapy: Secondary | ICD-10-CM | POA: Insufficient documentation

## 2020-10-27 DIAGNOSIS — K31A11 Gastric intestinal metaplasia without dysplasia, involving the antrum: Secondary | ICD-10-CM | POA: Insufficient documentation

## 2020-10-27 DIAGNOSIS — Z880 Allergy status to penicillin: Secondary | ICD-10-CM | POA: Diagnosis not present

## 2020-10-27 DIAGNOSIS — Z888 Allergy status to other drugs, medicaments and biological substances status: Secondary | ICD-10-CM | POA: Insufficient documentation

## 2020-10-27 DIAGNOSIS — K254 Chronic or unspecified gastric ulcer with hemorrhage: Secondary | ICD-10-CM | POA: Diagnosis not present

## 2020-10-27 DIAGNOSIS — K3189 Other diseases of stomach and duodenum: Secondary | ICD-10-CM | POA: Diagnosis not present

## 2020-10-27 DIAGNOSIS — G43909 Migraine, unspecified, not intractable, without status migrainosus: Secondary | ICD-10-CM | POA: Insufficient documentation

## 2020-10-27 DIAGNOSIS — D509 Iron deficiency anemia, unspecified: Secondary | ICD-10-CM | POA: Diagnosis present

## 2020-10-27 DIAGNOSIS — Z8542 Personal history of malignant neoplasm of other parts of uterus: Secondary | ICD-10-CM | POA: Diagnosis not present

## 2020-10-27 DIAGNOSIS — Z87891 Personal history of nicotine dependence: Secondary | ICD-10-CM | POA: Insufficient documentation

## 2020-10-27 DIAGNOSIS — K219 Gastro-esophageal reflux disease without esophagitis: Secondary | ICD-10-CM | POA: Diagnosis not present

## 2020-10-27 HISTORY — PX: ESOPHAGOGASTRODUODENOSCOPY (EGD) WITH PROPOFOL: SHX5813

## 2020-10-27 HISTORY — PX: BIOPSY: SHX5522

## 2020-10-27 SURGERY — ESOPHAGOGASTRODUODENOSCOPY (EGD) WITH PROPOFOL
Anesthesia: General

## 2020-10-27 MED ORDER — STERILE WATER FOR IRRIGATION IR SOLN
Status: DC | PRN
  Administered 2020-10-27: 100 mL

## 2020-10-27 MED ORDER — LIDOCAINE HCL (CARDIAC) PF 100 MG/5ML IV SOSY
PREFILLED_SYRINGE | INTRAVENOUS | Status: DC | PRN
  Administered 2020-10-27: 50 mg via INTRAVENOUS

## 2020-10-27 MED ORDER — PROPOFOL 10 MG/ML IV BOLUS
INTRAVENOUS | Status: DC | PRN
  Administered 2020-10-27: 100 mg via INTRAVENOUS
  Administered 2020-10-27 (×2): 50 mg via INTRAVENOUS

## 2020-10-27 MED ORDER — SODIUM CHLORIDE 0.9 % IV SOLN: INTRAVENOUS | Status: DC

## 2020-10-27 MED ORDER — LACTATED RINGERS IV SOLN: INTRAVENOUS | Status: DC

## 2020-10-27 MED ORDER — OMEPRAZOLE 40 MG PO CPDR
40.0000 mg | DELAYED_RELEASE_CAPSULE | Freq: Two times a day (BID) | ORAL | 3 refills | Status: DC
Start: 2020-10-27 — End: 2021-02-20

## 2020-10-27 NOTE — H&P (Signed)
Michelle Holt is an 50 y.o. female.   Chief Complaint: Melena and anemia HPI: 50 year old female with past medical history of breast cancer, uterine cancer, ovarian carcinoma, GERD, hyperlipidemia, urinary stones, anxiety, depression, migraine, who came to the hospital a for evaluation of melena.  Patient believes that for the last 2 months she has presented recurrent episode of melena almost on a daily basis.  Due to this she has felt weak but denies having any frequent abdominal pain, but she always has some discomfort in her left lower quadrant.  She was seen by Dr. Delton Coombes who found her to be anemic with a hemoglobin of 11.7, with most recent hemoglobin of 9.5.  In fact she had to have a blood transfusion as she had low value of 6.7.  She denies taking any NSAIDs or any anticoagulants.  Past Medical History:  Diagnosis Date   Anemia    Anxiety and depression    Arthritis of facet joints at multiple vertebral levels    L5-S1   Constipation    Dyslipidemia    Family history of breast cancer    Family history of uterine cancer    GERD (gastroesophageal reflux disease)    History of hiatal hernia    History of kidney stones    Insomnia    Irritable bowel syndrome    Migraine    Muscle tension headache    Neuropathy of finger    Ovarian carcinoma (HCC)    ovarian   Plantar fasciitis of right foot    Port-A-Cath in place 08/20/2018    Past Surgical History:  Procedure Laterality Date   ABDOMINAL HYSTERECTOMY     CHOLECYSTECTOMY  2008   COLONOSCOPY N/A 08/13/2013   Procedure: COLONOSCOPY;  Surgeon: Rogene Houston, MD;  [facility]: [facility];  Service: Endoscopy;  Laterality: N/A;  230-moved to 145 Ann to notify pt   ESOPHAGOGASTRODUODENOSCOPY     ESOPHAGOGASTRODUODENOSCOPY (EGD) WITH PROPOFOL N/A 07/20/2018   Procedure: ESOPHAGOGASTRODUODENOSCOPY (EGD) WITH PROPOFOL;  Surgeon: Rogene Houston, MD;  Location: [facility];  Service: Endoscopy;  Laterality: N/A;  Possible  esophageal dilation.   IR ANGIOGRAM SELECTIVE EACH ADDITIONAL VESSEL  08/01/2018   IR ANGIOGRAM VISCERAL SELECTIVE  08/01/2018   IR EMBO ART  VEN HEMORR LYMPH EXTRAV  INC GUIDE ROADMAPPING  08/01/2018   IR IMAGING GUIDED PORT INSERTION  08/20/2018   IR PERC PLEURAL DRAIN W/INDWELL CATH W/IMG GUIDE  07/08/2018   IR THORACENTESIS ASP PLEURAL SPACE W/IMG GUIDE  07/07/2018   IR US GUIDE VASC ACCESS RIGHT  08/01/2018   PLEURAL EFFUSION DRAINAGE Left 07/13/2018   Procedure: DRAINAGE OF LOCULATED PLEURAL EFFUSION;  Surgeon: Ivin Poot, MD;  Location: [place];  Service: Thoracic;  Laterality: Left;   REMOVAL OF PLEURAL DRAINAGE CATHETER Left 08/20/2018   Procedure: REMOVAL OF PLEURAL DRAINAGE CATHETER;  Surgeon: Ivin Poot, MD;  Location: [place];  Service: Thoracic;  Laterality: Left;   REMOVAL OF PLEURAL DRAINAGE CATHETER Left 08/20/2018   Procedure: REMOVAL OF PLEURAL DRAINAGE CATHETER;  Surgeon: Ivin Poot, MD;  Location: [place];  Service: Thoracic;  Laterality: Left;   TALC PLEURODESIS Left 07/13/2018   Procedure: Talc Pleuradesis;  Surgeon: Prescott Gum, Collier Salina, MD;  Location: [place];  Service: Thoracic;  Laterality: Left;   TOTAL HIP ARTHROPLASTY Left 06/05/2020   Procedure: LEFT TOTAL HIP ARTHROPLASTY ANTERIOR APPROACH;  Surgeon: Leandrew Koyanagi, MD;  Location: [place];  Service: Orthopedics;  Laterality: Left;  3-C  TUBAL LIGATION Bilateral    UTERINE ABLATION     VIDEO ASSISTED THORACOSCOPY Left 07/13/2018   Procedure: VIDEO ASSISTED THORACOSCOPY;  Surgeon: Prescott Gum, Collier Salina, MD;  Location: [facility] OR;  Service: Thoracic;  Laterality: Left;    Family History  Problem Relation Age of Onset   Hypertension Mother    Obesity Mother    Diabetes Mother    Kidney disease Mother    Peripheral vascular disease Father    Atrial fibrillation Father    COPD Brother    Osteoporosis Brother    Crohn's disease Sister    Uterine cancer Sister 15       maternal half sister   Breast cancer Maternal Aunt 78    Colon cancer Neg Hx    Social History:  reports that she quit smoking about 2 years ago. Her smoking use included cigarettes. She has a 8.50 pack-year smoking history. She has never used smokeless tobacco. She reports current alcohol use of about 1.0 standard drink per week. She reports that she does not use drugs.  Allergies:  Allergies  Allergen Reactions   Morphine And Related Itching   Nickel Itching   Nortriptyline Other (See Comments)    Significant weight gain   Topamax [Topiramate] Diarrhea and Nausea Only   Xanax [Alprazolam]     Can't wake up    Actifed Cold-Allergy [Chlorpheniramine-Phenyleph Er] Rash   Amoxicillin Rash    Did it involve swelling of the face/tongue/throat, SOB, or low BP? Unknown Did it involve sudden or severe rash/hives, skin peeling, or any reaction on the inside of your mouth or nose? Unknown Did you need to seek medical attention at a hospital or doctor's office? Unknown When did it last happen? teenager       If all above answers are "NO", may proceed with cephalosporin use.    Codeine Hives   Erythromycin Rash   Penicillins Rash    Did it involve swelling of the face/tongue/throat, SOB, or low BP? Unknown Did it involve sudden or severe rash/hives, skin peeling, or any reaction on the inside of your mouth or nose? Unknown Did you need to seek medical attention at a hospital or doctor's office? Unknown When did it last happen? teenager       If all above answers are "NO", may proceed with cephalosporin use.    Sudafed [Pseudoephedrine Hcl] Rash    Medications Prior to Admission  Medication Sig Dispense Refill   diazepam (VALIUM) 5 MG tablet TAKE ONE TABLET BY MOUTH AT BEDTIME (Patient taking differently: Take 5 mg by mouth at bedtime.) 30 tablet 5   docusate sodium (COLACE) 100 MG capsule Take 1 capsule (100 mg total) by mouth daily as needed. To be taken after surgery 30 capsule 2   gabapentin (NEURONTIN) 100 MG capsule TAKE TWO (2) CAPSULES BY  MOUTH AT BEDTIME 60 capsule 2   oxyCODONE-acetaminophen (PERCOCET) 7.5-325 MG tablet Take 1-2 tabs every 6 hours prn pain.  To be started as needed after surgery (Patient taking differently: Take 1-2 tabs every 6 hours prn pain.  To be started as needed after surgery) 40 tablet 0   pantoprazole (PROTONIX) 40 MG tablet TAKE ONE TABLET BY MOUTH TWICE DAILY BEFORE A MEAL 60 tablet 2   tiZANidine (ZANAFLEX) 4 MG tablet Take 1 tablet (4 mg total) by mouth 3 (three) times daily as needed for muscle spasms. (Patient taking differently: Take 4 mg by mouth at bedtime.) 60 tablet 0   venlafaxine XR (EFFEXOR-XR)  150 MG 24 hr capsule TAKE ONE CAPSULE BY MOUTH DAILY WITH BREAKFAST (Patient taking differently: Take 150 mg by mouth at bedtime.) 30 capsule 6   albuterol (VENTOLIN HFA) 108 (90 Base) MCG/ACT inhaler Inhale 2 puffs into the lungs every 6 (six) hours as needed for wheezing or shortness of breath.     Ascorbic Acid (VITAMIN C) 1000 MG tablet Take 2,000 mg by mouth daily.     aspirin EC 81 MG tablet Take 1 tablet (81 mg total) by mouth 2 (two) times daily. (Patient not taking: No sig reported) 84 tablet 0   B Complex-C (B-COMPLEX WITH VITAMIN C) tablet Take 1 tablet by mouth daily.     Cholecalciferol (VITAMIN D) 50 MCG (2000 UT) CAPS Take 2,000 Units by mouth daily.     lidocaine (LIDODERM) 5 % Place 1 patch onto the skin daily. Remove & Discard patch within 12 hours or as directed by MD (Patient not taking: Reported on 10/18/2020) 30 patch 0   lidocaine-prilocaine (EMLA) cream Apply small amount to port a cath site and cover with plastic wrap one hour prior to chemotherapy appointments 30 g 3   Magnesium 500 MG TABS Take 500 mg by mouth in the morning.     ondansetron (ZOFRAN) 4 MG tablet Take 1 tablet (4 mg total) by mouth every 8 (eight) hours as needed for nausea or vomiting. 40 tablet 0   Oxycodone HCl 10 MG TABS Take 1 tablet (10 mg total) by mouth every 4 (four) hours as needed. 168 tablet 0    sennosides-docusate sodium (SENOKOT-S) 8.6-50 MG tablet Take 1 tablet by mouth daily as needed for constipation.     SUMAtriptan (IMITREX) 100 MG tablet TAKE ONE TABLET BY MOUTH PRN UP to TWICE DAILY AS NEEDED. 9 tablet 5   triamcinolone cream (KENALOG) 0.1 % Apply 1 application topically 2 (two) times daily as needed (rash). 80 g 11    No results found for this or any previous visit (from the past 48 hour(s)). No results found.  Review of Systems  Constitutional: Negative.   HENT: Negative.    Eyes: Negative.   Respiratory: Negative.    Cardiovascular: Negative.   Gastrointestinal:  Positive for blood in stool.  Endocrine: Negative.   Genitourinary: Negative.   Musculoskeletal: Negative.   Skin: Negative.   Allergic/Immunologic: Negative.   Neurological: Negative.   Hematological: Negative.   Psychiatric/Behavioral: Negative.     Blood pressure (!) 115/52, pulse 71, temperature 98.4 F (36.9 C), temperature source Oral, resp. rate 16, SpO2 100 %. Physical Exam  GENERAL: The patient is AO x3, in no acute distress. HEENT: Head is normocephalic and atraumatic. EOMI are intact. Mouth is well hydrated and without lesions. NECK: Supple. No masses LUNGS: Clear to auscultation. No presence of rhonchi/wheezing/rales. Adequate chest expansion HEART: RRR, normal s1 and s2. ABDOMEN mildly tender in LLQ, no guarding, no peritoneal signs, and nondistended. BS +. No masses. EXTREMITIES: Without any cyanosis, clubbing, rash, lesions or edema. NEUROLOGIC: AOx3, no focal motor deficit. SKIN: no jaundice, no rashes  Assessment/Plan 50 year old female with past medical history of breast cancer, uterine cancer, ovarian carcinoma, GERD, hyperlipidemia, urinary stones, anxiety, depression, migraine, who came to the hospital a for evaluation of melena.  We will proceed with EGD.   Harvel Quale, MD 10/27/2020, 11:55 AM

## 2020-10-27 NOTE — Anesthesia Preprocedure Evaluation (Signed)
Anesthesia Evaluation  Patient identified by MRN, date of birth, ID band Patient awake    Reviewed: Allergy & Precautions, NPO status , Patient's Chart, lab work & pertinent test results  History of Anesthesia Complications Negative for: history of anesthetic complications  Airway Mallampati: II  TM Distance: >3 FB Neck ROM: Full    Dental  (+) Dental Advisory Given   Pulmonary shortness of breath and with exertion, pneumonia, former smoker,  Malignant neoplasm of parietal pleura   Pulmonary exam normal breath sounds clear to auscultation       Cardiovascular Exercise Tolerance: Good Normal cardiovascular exam Rhythm:Regular Rate:Normal     Neuro/Psych  Headaches, PSYCHIATRIC DISORDERS Anxiety Depression  Neuromuscular disease    GI/Hepatic Neg liver ROS, hiatal hernia, GERD  Medicated and Controlled,  Endo/Other  negative endocrine ROS  Renal/GU negative Renal ROS  Female GU complaint (ovarian cancer)     Musculoskeletal  (+) Arthritis , narcotic dependentLumbosacral spondylosis    Abdominal   Peds  Hematology  (+) anemia ,   Anesthesia Other Findings   Reproductive/Obstetrics                           Anesthesia Physical Anesthesia Plan  ASA: 3  Anesthesia Plan: General   Post-op Pain Management:    Induction: Intravenous  PONV Risk Score and Plan: TIVA  Airway Management Planned: Natural Airway and Nasal Cannula  Additional Equipment:   Intra-op Plan:   Post-operative Plan:   Informed Consent: I have reviewed the patients History and Physical, chart, labs and discussed the procedure including the risks, benefits and alternatives for the proposed anesthesia with the patient or authorized representative who has indicated his/her understanding and acceptance.     Dental advisory given  Plan Discussed with: CRNA and Surgeon  Anesthesia Plan Comments:          Anesthesia Quick Evaluation

## 2020-10-27 NOTE — Op Note (Signed)
[facility] Patient Name: Michelle Holt Procedure Date: 10/27/2020 11:53 AM MRN: [id number] Date of Birth: 1970-11-12 Attending MD: Maylon Peppers ,  CSN: [id number] Age: 50 Admit Type: Outpatient Procedure:                Upper GI endoscopy Indications:              Iron deficiency anemia, Melena Providers:                Maylon Peppers, Caprice Kluver, Casimer Bilis,                            Technician Referring MD:              Medicines:                Monitored Anesthesia Care Complications:            No immediate complications. Estimated Blood Loss:     Estimated blood loss: none. Procedure:                Pre-Anesthesia Assessment:                           - Prior to the procedure, a History and Physical                            was performed, and patient medications, allergies                            and sensitivities were reviewed. The patient's                            tolerance of previous anesthesia was reviewed.                           - The risks and benefits of the procedure and the                            sedation options and risks were discussed with the                            patient. All questions were answered and informed                            consent was obtained.                           - ASA Grade Assessment: III - A patient with severe                            systemic disease.                           After obtaining informed consent, the endoscope was                            passed under direct vision. Throughout the  procedure, the patient's blood pressure, pulse, and                            oxygen saturations were monitored continuously. The                            [id number]-H190 ([id number]) scope was introduced through the                            mouth, and advanced to the second part of duodenum.                            The upper GI endoscopy was accomplished without                             difficulty. The patient tolerated the procedure                            well. Scope In: 12:07:59 PM Scope Out: 12:13:00 PM Total Procedure Duration: 0 hours 5 minutes 1 second  Findings:      A 6 cm hiatal hernia with a few Cameron ulcers was found. The hiatal       narrowing was 38 cm from the incisors. The Z-line was 32 cm from the       incisors.      A few localized small erosions with no stigmata of recent bleeding were       found in the gastric body. Biopsies were taken with a cold forceps for       histology.      The examined duodenum was normal. Impression:               - 6 cm hiatal hernia with a few Cameron ulcers.                           - Erosive gastropathy with no stigmata of recent                            bleeding. Biopsied.                           - Normal examined duodenum. Moderate Sedation:      Per Anesthesia Care Recommendation:           - Discharge patient to home (ambulatory).                           - Resume previous diet.                           - Use Nexium (esomeprazole) 40 mg PO BID. Stop                            pantoprazole                           - Follow up in GI clinic in next available to  discuss colonoscopy +/- capsule endoscopy versus                            referral for hernia repair.                           - Await pathology results. Procedure Code(s):        --- Professional ---                           [phone], Esophagogastroduodenoscopy, flexible,                            transoral; with biopsy, single or multiple Diagnosis Code(s):        --- Professional ---                           K44.9, Diaphragmatic hernia without obstruction or                            gangrene                           K25.9, Gastric ulcer, unspecified as acute or                            chronic, without hemorrhage or perforation                           K31.89, Other diseases of stomach  and duodenum                           D50.9, Iron deficiency anemia, unspecified                           K92.1, Melena (includes Hematochezia) CPT copyright 2019 American Medical Association. All rights reserved. The codes documented in this report are preliminary and upon coder review may  be revised to meet current compliance requirements. Maylon Peppers, MD Maylon Peppers,  10/27/2020 12:20:39 PM This report has been signed electronically. Number of Addenda: 0

## 2020-10-27 NOTE — Discharge Instructions (Signed)
You are being discharged to home.  Resume your previous diet.  Take Nexium (esomeprazole) 40 mg by mouth twice a day.  Stop pantoprazole Follow up in GI clinic in next available to discuss colonoscopy +/- capsule endoscopy versus referral for hernia repair.

## 2020-10-27 NOTE — Transfer of Care (Signed)
Immediate Anesthesia Transfer of Care Note  Patient: Michelle Holt  Procedure(s) Performed: ESOPHAGOGASTRODUODENOSCOPY (EGD) WITH PROPOFOL BIOPSY  Patient Location: Short Stay  Anesthesia Type:General  Level of Consciousness: awake  Airway & Oxygen Therapy: Patient Spontanous Breathing and Patient connected to nasal cannula oxygen  Post-op Assessment: Report given to RN and Post -op Vital signs reviewed and stable  Post vital signs: Reviewed and stable  Last Vitals:  Vitals Value Taken Time  BP    Temp    Pulse    Resp    SpO2 95%     Last Pain:  Vitals:   10/27/20 1206  TempSrc:   PainSc: 0-No pain         Complications: No notable events documented.

## 2020-10-27 NOTE — Anesthesia Postprocedure Evaluation (Signed)
Anesthesia Post Note  Patient: Michelle Holt  Procedure(s) Performed: ESOPHAGOGASTRODUODENOSCOPY (EGD) WITH PROPOFOL BIOPSY  Patient location during evaluation: Phase II Anesthesia Type: General Level of consciousness: awake and alert and oriented Pain management: pain level controlled Vital Signs Assessment: post-procedure vital signs reviewed and stable Respiratory status: spontaneous breathing, nonlabored ventilation and respiratory function stable Cardiovascular status: blood pressure returned to baseline and stable Postop Assessment: no apparent nausea or vomiting Anesthetic complications: no   No notable events documented.   Last Vitals:  Vitals:   10/27/20 1031 10/27/20 1231  BP: (!) 115/52 (!) 102/47  Pulse: 71 72  Resp: 16 16  Temp: 36.9 C 36.7 C  SpO2: 100% 97%    Last Pain:  Vitals:   10/27/20 1231  TempSrc: Oral  PainSc: 0-No pain                 Kairo Laubacher C Kyung Muto

## 2020-10-30 LAB — SURGICAL PATHOLOGY

## 2020-11-01 ENCOUNTER — Encounter (HOSPITAL_COMMUNITY): Payer: Self-pay | Admitting: Gastroenterology

## 2020-11-07 ENCOUNTER — Ambulatory Visit (INDEPENDENT_AMBULATORY_CARE_PROVIDER_SITE_OTHER): Payer: Medicare Other | Admitting: Gastroenterology

## 2020-11-07 ENCOUNTER — Encounter (INDEPENDENT_AMBULATORY_CARE_PROVIDER_SITE_OTHER): Payer: Self-pay | Admitting: Gastroenterology

## 2020-11-07 ENCOUNTER — Other Ambulatory Visit: Payer: Self-pay

## 2020-11-07 VITALS — BP 117/77 | HR 91 | Temp 98.1°F | Ht 66.0 in | Wt 181.1 lb

## 2020-11-07 DIAGNOSIS — D509 Iron deficiency anemia, unspecified: Secondary | ICD-10-CM

## 2020-11-07 DIAGNOSIS — K921 Melena: Secondary | ICD-10-CM | POA: Insufficient documentation

## 2020-11-07 LAB — CBC
HCT: 39 % (ref 35.0–45.0)
Hemoglobin: 12.9 g/dL (ref 11.7–15.5)
MCH: 32.8 pg (ref 27.0–33.0)
MCHC: 33.1 g/dL (ref 32.0–36.0)
MCV: 99.2 fL (ref 80.0–100.0)
MPV: 9.6 fL (ref 7.5–12.5)
Platelets: 444 10*3/uL — ABNORMAL HIGH (ref 140–400)
RBC: 3.93 10*6/uL (ref 3.80–5.10)
RDW: 19.1 % — ABNORMAL HIGH (ref 11.0–15.0)
WBC: 7.5 10*3/uL (ref 3.8–10.8)

## 2020-11-07 NOTE — Patient Instructions (Signed)
We will check your blood count today to ensure your hemoglobin is remaining stable  We will get you scheduled for colonoscopy for further evaluation of your anemia, as discussed we may eventually need to proceed with a capsule study, if colonoscopy does not reveal source of anemia.  We will send referral to general surgery for further evaluation of hiatal hernia repair  Follow up 3 months

## 2020-11-07 NOTE — Progress Notes (Signed)
Referring Provider: Derek Jack, MD Primary Care Physician:  [name], MD Primary GI Physician: Rehman  Chief Complaint  Patient presents with   Abdominal Pain    Patient here today due to LUQ pain. She has a history of a hiatal hernia and needs this repaired. She has had three positive hemoccult stool cards. She has dizziness, fatigue,and shortness of breath. She has had three Fe infusions and a Unit of PRBC.   HPI:   Michelle Holt is a 50 y.o. female with past medical history of anemia, anxiety, depression, dyslipidemia, GERD, HH, IBS, ovarian carcinoma, uterine cancer, plantar fascitis of R foot.   Patient presenting today for follow up of IDA.   Patient has had ongoing anemia since April 2022.  She was initially found to have hgb 6.9 in April during pre op labs for a hysterectomy related to ovarian and uterine cancer. She had a blood transfusion at that time and hgb remained stable until May with slight drop to 11.7,  In September, she was found to be anemic with hgb as low as 6.7 requiring another transfusion.  Her last hgb 10/11/20 was 9.5. she had 2/3 positive occult blood cards 9/24-9/26. She has had one PRBC transfusion as well as three iron infusions since September.   She presented for endoscopic evaluation of melena 10/27/20. EGD performed at that time with presence of 6 cm hh, cameron ulcers and erosive gastropathy with no stigmata of recent bleeding. Started on omeprazole 40mg  BID, consideration of colonoscopy with possible capsule study as well as referral to gen surgery for evaluation of hiatal hernia repair was recommended.   She denies any hematochezia, does report black stools 2-3 x per week. She denies any nausea or vomiting. She sometimes has dizziness and lightheadedness at times, she had her last iron infusion about 3 weeks ago. She does report some worsening fatigue today but states she can typically tell when her hemoglobin has dropped significantly due  to how she feels and does not think it has gotten low enough to require a transfusion, based on how she feels.  She does endorse that she has some LUQ pain that is ongoing since her splenic laceration in 2020, pain occurs anytime she bends over. Notably, last CT A/P with contrast in June for oncology follow up shows chronic splenic hematoma present.   She does endorse some issues with dysphagia at times. She occasionally will have to cough the food back up. She sometimes feels like she is unable to use her reflex to swallow. She does endorse acid regurgitation if she eats late in the day, however, she tries to avoid this, and is taking omeprazole 40mg  BID.  Notably, she had significant anemia in July 2020 that was initially evaluated with EGD, later found to be related to spleen being nicked during chest tube she previously had placed.   No hematuria or nose bleeds. She does not take blood thinners   NSAID use: does not use Social WJ:XBJYNWGNFA ETOH, no drugs or tobacco Fam OZ:HYQMVH has crohn's Last Colonoscopy:7/28/15Normal colonoscopy except external hemorrhoids and small anal papillae. Last Endoscopy:- 10/27/20 6 cm hiatal hernia with a few Cameron ulcers.- Erosive gastropathy with no stigmata of recent bleeding. Neg for intestinal metaplasia or h pylori - Normal examined duodenum.  Recommendations:  Repeat EGD with gastric mapping in oct 2025  Past Medical History:  Diagnosis Date   Anemia    Anxiety and depression    Arthritis of facet joints at multiple  vertebral levels    L5-S1   Constipation    Dyslipidemia    Family history of breast cancer    Family history of uterine cancer    GERD (gastroesophageal reflux disease)    History of hiatal hernia    History of kidney stones    Insomnia    Irritable bowel syndrome    Migraine    Muscle tension headache    Neuropathy of finger    Ovarian carcinoma (HCC)    ovarian   Plantar fasciitis of right foot    Port-A-Cath in  place 08/20/2018    Past Surgical History:  Procedure Laterality Date   ABDOMINAL HYSTERECTOMY     BIOPSY  10/27/2020   Procedure: BIOPSY;  Surgeon: Harvel Quale, MD;  [facility];  Service: Gastroenterology;;   CHOLECYSTECTOMY  2008   COLONOSCOPY N/A 08/13/2013   Procedure: COLONOSCOPY;  Surgeon: Rogene Houston, MD;  [facility];  Service: Endoscopy;  Laterality: N/A;  230-moved to 145 Ann to notify pt   ESOPHAGOGASTRODUODENOSCOPY     ESOPHAGOGASTRODUODENOSCOPY (EGD) WITH PROPOFOL N/A 07/20/2018   Procedure: ESOPHAGOGASTRODUODENOSCOPY (EGD) WITH PROPOFOL;  Surgeon: Rogene Houston, MD;  [facility];  Service: Endoscopy;  Laterality: N/A;  Possible esophageal dilation.   ESOPHAGOGASTRODUODENOSCOPY (EGD) WITH PROPOFOL N/A 10/27/2020   Procedure: ESOPHAGOGASTRODUODENOSCOPY (EGD) WITH PROPOFOL;  Surgeon: Harvel Quale, MD;  [facility];  Service: Gastroenterology;  Laterality: N/A;  2:10, pt knows to arrive at 10:15   IR [place]  08/01/2018   IR ANGIOGRAM VISCERAL SELECTIVE  08/01/2018   IR EMBO ART  VEN HEMORR LYMPH EXTRAV  INC GUIDE ROADMAPPING  08/01/2018   IR IMAGING GUIDED PORT INSERTION  08/20/2018   IR PERC PLEURAL DRAIN W/INDWELL CATH W/IMG GUIDE  07/08/2018   IR THORACENTESIS ASP PLEURAL SPACE W/IMG GUIDE  07/07/2018   IR US GUIDE VASC ACCESS RIGHT  08/01/2018   PLEURAL EFFUSION DRAINAGE Left 07/13/2018   Procedure: DRAINAGE OF LOCULATED PLEURAL EFFUSION;  Surgeon: Ivin Poot, MD;  Location: [place];  Service: Thoracic;  Laterality: Left;   REMOVAL OF PLEURAL DRAINAGE CATHETER Left 08/20/2018   Procedure: REMOVAL OF PLEURAL DRAINAGE CATHETER;  Surgeon: Ivin Poot, MD;  Location: [place];  Service: Thoracic;  Laterality: Left;   REMOVAL OF PLEURAL DRAINAGE CATHETER Left 08/20/2018   Procedure: REMOVAL OF PLEURAL DRAINAGE CATHETER;  Surgeon: Ivin Poot, MD;  Location: [place];   Service: Thoracic;  Laterality: Left;   TALC PLEURODESIS Left 07/13/2018   Procedure: Talc Pleuradesis;  Surgeon: Prescott Gum, Collier Salina, MD;  Location: [place];  Service: Thoracic;  Laterality: Left;   TOTAL HIP ARTHROPLASTY Left 06/05/2020   Procedure: LEFT TOTAL HIP ARTHROPLASTY ANTERIOR APPROACH;  Surgeon: Leandrew Koyanagi, MD;  Location: [place];  Service: Orthopedics;  Laterality: Left;  3-C   TUBAL LIGATION Bilateral    UTERINE ABLATION     VIDEO ASSISTED THORACOSCOPY Left 07/13/2018   Procedure: VIDEO ASSISTED THORACOSCOPY;  Surgeon: Prescott Gum, Collier Salina, MD;  Location: [place];  Service: Thoracic;  Laterality: Left;    Current Outpatient Medications  Medication Sig Dispense Refill   albuterol (VENTOLIN HFA) 108 (90 Base) MCG/ACT inhaler Inhale 2 puffs into the lungs every 6 (six) hours as needed for wheezing or shortness of breath.     Ascorbic Acid (VITAMIN C) 1000 MG tablet Take 2,000 mg by mouth daily.     B Complex-C (B-COMPLEX WITH  VITAMIN C) tablet Take 1 tablet by mouth daily.     Cholecalciferol (VITAMIN D) 50 MCG (2000 UT) CAPS Take 2,000 Units by mouth daily.     diazepam (VALIUM) 5 MG tablet TAKE ONE TABLET BY MOUTH AT BEDTIME (Patient taking differently: Take 5 mg by mouth at bedtime.) 30 tablet 5   docusate sodium (COLACE) 100 MG capsule Take 1 capsule (100 mg total) by mouth daily as needed. To be taken after surgery 30 capsule 2   gabapentin (NEURONTIN) 100 MG capsule TAKE TWO (2) CAPSULES BY MOUTH AT BEDTIME 60 capsule 2   lidocaine-prilocaine (EMLA) cream Apply small amount to port a cath site and cover with plastic wrap one hour prior to chemotherapy appointments 30 g 3   Magnesium 500 MG TABS Take 500 mg by mouth in the morning.     omeprazole (PRILOSEC) 40 MG capsule Take 1 capsule (40 mg total) by mouth 2 (two) times daily. 180 capsule 3   ondansetron (ZOFRAN) 4 MG tablet Take 1 tablet (4 mg total) by mouth every 8 (eight) hours as needed for nausea or vomiting. 40 tablet 0    Oxycodone HCl 10 MG TABS Take 1 tablet (10 mg total) by mouth every 4 (four) hours as needed. 168 tablet 0   sennosides-docusate sodium (SENOKOT-S) 8.6-50 MG tablet Take 1 tablet by mouth daily as needed for constipation.     SUMAtriptan (IMITREX) 100 MG tablet TAKE ONE TABLET BY MOUTH PRN UP to TWICE DAILY AS NEEDED. 9 tablet 5   tiZANidine (ZANAFLEX) 4 MG tablet Take 1 tablet (4 mg total) by mouth 3 (three) times daily as needed for muscle spasms. (Patient taking differently: Take 4 mg by mouth at bedtime.) 60 tablet 0   triamcinolone cream (KENALOG) 0.1 % Apply 1 application topically 2 (two) times daily as needed (rash). 80 g 11   venlafaxine XR (EFFEXOR-XR) 150 MG 24 hr capsule TAKE ONE CAPSULE BY MOUTH DAILY WITH BREAKFAST (Patient taking differently: Take 150 mg by mouth at bedtime.) 30 capsule 6   aspirin EC 81 MG tablet Take 1 tablet (81 mg total) by mouth 2 (two) times daily. (Patient not taking: No sig reported) 84 tablet 0   No current facility-administered medications for this visit.   Facility-Administered Medications Ordered in Other Visits  Medication Dose Route Frequency Provider Last Rate Last Admin   0.9 %  sodium chloride infusion   Intravenous Continuous Derek Jack, MD   Stopped at 10/23/20 1547   sodium chloride flush (NS) 0.9 % injection 10 mL  10 mL Intravenous PRN Derek Jack, MD   10 mL at 10/23/20 1544    Allergies as of 11/07/2020 - Review Complete 11/07/2020  Allergen Reaction Noted   Morphine and related Itching 07/06/2018   Nickel Itching 06/05/2020   Nortriptyline Other (See Comments) 07/31/2017   Topamax [topiramate] Diarrhea and Nausea Only 07/31/2017   Xanax [alprazolam]  06/23/2019   Actifed cold-allergy [chlorpheniramine-phenyleph er] Rash 11/11/2011   Amoxicillin Rash 11/11/2011   Codeine Hives 11/11/2011   Erythromycin Rash 11/11/2011   Penicillins Rash 11/11/2011   Sudafed [pseudoephedrine hcl] Rash 11/11/2011    Family History   Problem Relation Age of Onset   Hypertension Mother    Obesity Mother    Diabetes Mother    Kidney disease Mother    Peripheral vascular disease Father    Atrial fibrillation Father    COPD Brother    Osteoporosis Brother    Crohn's disease Sister  Uterine cancer Sister 50       maternal half sister   Breast cancer Maternal Aunt 31   Colon cancer Neg Hx     Social History   Socioeconomic History   Marital status: Widowed    Spouse name: Not on file   Number of children: 2   Years of education: 2-College   Highest education level: Not on file  Occupational History    Employer: [employer]  Tobacco Use   Smoking status: Former    Packs/day: 0.50    Years: 17.00    Pack years: 8.50    Types: Cigarettes    Quit date: 06/22/2018    Years since quitting: 2.3   Smokeless tobacco: Never  Vaping Use   Vaping Use: Never used  Substance and Sexual Activity   Alcohol use: Yes    Alcohol/week: 1.0 standard drink    Types: 1 Glasses of wine per week    Comment: Drinks alcohol on occasion   Drug use: No   Sexual activity: Not on file  Other Topics Concern   Not on file  Social History Narrative   Patient lives at home with her daughter.    Patient has 2 children.    Patient is widowed.    Patient is right handed.    Patient has her Associates degree.      Social Determinants of Health   Financial Resource Strain: Not on file  Food Insecurity: Not on file  Transportation Needs: Not on file  Physical Activity: Not on file  Stress: Not on file  Social Connections: Not on file   Review of systems General: negative for malaise, night sweats, fever, chills, weight los Neck: Negative for lumps, goiter, pain and significant neck swelling Resp: Negative for cough, wheezing, dyspnea at rest CV: Negative for chest pain, leg swelling, palpitations, orthopnea GI: denies hematochezia, nausea, vomiting, diarrhea, constipation, dysphagia, odyonophagia, early satiety or unintentional  weight loss. +melena MSK: Negative for joint pain or swelling, back pain, and muscle pain. Derm: Negative for itching or rash Psych: Denies depression, anxiety, memory loss, confusion. No homicidal or suicidal ideation.  Heme: Negative for prolonged bleeding, bruising easily, and swollen nodes. Endocrine: Negative for cold or heat intolerance, polyuria, polydipsia and goiter. Neuro: negative for tremor, gait imbalance, syncope and seizures. The remainder of the review of systems is noncontributory.  Physical Exam: BP 117/77 (BP Location: Left Arm, Patient Position: Sitting, Cuff Size: Small)   Pulse 91   Temp 98.1 F (36.7 C) (Oral)   Ht 5\' 6"  (1.676 m)   Wt 181 lb 1.6 oz (82.1 kg)   LMP  (LMP Unknown)   BMI 29.23 kg/m  General:   Alert and oriented. No distress noted. Pleasant and cooperative.  Head:  Normocephalic and atraumatic. Eyes:  Conjuctiva clear without scleral icterus. Mouth:  Oral mucosa pink and moist. Good dentition. No lesions. Heart: Normal rate and rhythm, s1 and s2 heart sounds present.  Lungs: Clear lung sounds in all lobes. Respirations equal and unlabored. Abdomen:  +BS, soft, and non-distended. No rebound or guarding. No HSM or masses noted. TTP LUQ-reports chronic pain here since splenic laceration Derm: No palmar erythema or jaundice Msk:  Symmetrical without gross deformities. Normal posture. Extremities:  Without edema. Neurologic:  Alert and  oriented x4 Psych:  Alert and cooperative. Normal mood and affect.  Invalid input(s): 6 MONTHS   ASSESSMENT: Michelle Holt is a 50 y.o. female presenting today for follow up of ongoing anemia,  present since April 2022.  Ongoing anemia since April, requiring a blood transfusion and 3 iron infusions since september. EGD 10/14 r/t evaluation of ongoing anemia and melena EGD with presence of 6cm hiatal hernia with presence of cameron lesions without stigmata of bleeding as well as gastritis. She was started on  omeprazole 40mg  BID and further evaluation of melena/anemia recommended consideration of colonoscopy +/- capsule study, as well as referral to general surgery for possible hiatal hernia repair, as cameron lesions present could be source of her ongoing anemia. We will schedule her for colonoscopy as well as send referral to [place] surgery for evaluation of hiatal hernia repair. We will check CBC today as she is feeling more fatigued and last hgb was 9.5 at the end of September. She continues to note melena 2-3x/week.   Notably, she has chronic splenic hematoma on CT A/P in June 2022, while this is unlikely the source of her anemia, given its' chronic presence, we will have general surgery evaluate splenic hematoma as well.   PLAN:  Schedule colonoscopy 2. Referal to surgery for hh repair/splenic hematoma 3. Continue PPI bid 4.  check CBC today   Follow Up: 3 months  Affan Callow L. Alver Sorrow, MSN, APRN, AGNP-C Adult-Gerontology Nurse Practitioner [facility]

## 2020-11-08 ENCOUNTER — Other Ambulatory Visit (INDEPENDENT_AMBULATORY_CARE_PROVIDER_SITE_OTHER): Payer: Self-pay

## 2020-11-08 ENCOUNTER — Telehealth (INDEPENDENT_AMBULATORY_CARE_PROVIDER_SITE_OTHER): Payer: Self-pay

## 2020-11-08 ENCOUNTER — Encounter (INDEPENDENT_AMBULATORY_CARE_PROVIDER_SITE_OTHER): Payer: Self-pay

## 2020-11-08 DIAGNOSIS — D509 Iron deficiency anemia, unspecified: Secondary | ICD-10-CM

## 2020-11-08 MED ORDER — PEG 3350-KCL-NA BICARB-NACL 420 G PO SOLR: 4000.0000 mL | ORAL | 0 refills | Status: DC

## 2020-11-08 NOTE — Telephone Encounter (Signed)
Michelle Holt, CMA  

## 2020-11-10 NOTE — Patient Instructions (Signed)
FAJR FIFE  11/10/2020     @PREFPERIOPPHARMACY @   Your procedure is scheduled on  11/15/2020.   Report to [facility] at  1200  P.M.   Call this number if you have problems the morning of surgery:  [phone]   Remember:  Follow the diet and prep instructions given to you by the office.      Use your inhaler before you come and bring your rescue inhaler with you.    Take these medicines the morning of surgery with A SIP OF WATER                   Gabapentin, prilosec, zofran (if needed), oxycodone (if needed), imitrex (if needed).    Do not wear jewelry, make-up or nail polish.  Do not wear lotions, powders, or perfumes, or deodorant.  Do not shave 48 hours prior to surgery.  Men may shave face and neck.  Do not bring valuables to the hospital.  [facility] is not responsible for any belongings or valuables.  Contacts, dentures or bridgework may not be worn into surgery.  Leave your suitcase in the car.  After surgery it may be brought to your room.  For patients admitted to the hospital, discharge time will be determined by your treatment team.  Patients discharged the day of surgery will not be allowed to drive home and must have someone with them for 24 hours.    Special instructions:   DO NOT smoke tobacco or vape for 24 hours before your procedure.  Please read over the following fact sheets that you were given. Anesthesia Post-op Instructions and Care and Recovery After Surgery      Colonoscopy, Adult, Care After This sheet gives you information about how to care for yourself after your procedure. Your health care provider may also give you more specific instructions. If you have problems or questions, contact your health care provider. What can I expect after the procedure? After the procedure, it is common to have: A small amount of blood in your stool for 24 hours after the procedure. Some gas. Mild cramping or bloating of your  abdomen. Follow these instructions at home: Eating and drinking  Drink enough fluid to keep your urine pale yellow. Follow instructions from your health care provider about eating or drinking restrictions. Resume your normal diet as instructed by your health care provider. Avoid heavy or fried foods that are hard to digest. Activity Rest as told by your health care provider. Avoid sitting for a long time without moving. Get up to take short walks every 1-2 hours. This is important to improve blood flow and breathing. Ask for help if you feel weak or unsteady. Return to your normal activities as told by your health care provider. Ask your health care provider what activities are safe for you. Managing cramping and bloating  Try walking around when you have cramps or feel bloated. Apply heat to your abdomen as told by your health care provider. Use the heat source that your health care provider recommends, such as a moist heat pack or a heating pad. Place a towel between your skin and the heat source. Leave the heat on for 20-30 minutes. Remove the heat if your skin turns bright red. This is especially important if you are unable to feel pain, heat, or cold. You may have a greater risk of getting burned. General instructions If you were given a sedative during the  procedure, it can affect you for several hours. Do not drive or operate machinery until your health care provider says that it is safe. For the first 24 hours after the procedure: Do not sign important documents. Do not drink alcohol. Do your regular daily activities at a slower pace than normal. Eat soft foods that are easy to digest. Take over-the-counter and prescription medicines only as told by your health care provider. Keep all follow-up visits as told by your health care provider. This is important. Contact a health care provider if: You have blood in your stool 2-3 days after the procedure. Get help right away if you  have: More than a small spotting of blood in your stool. Large blood clots in your stool. Swelling of your abdomen. Nausea or vomiting. A fever. Increasing pain in your abdomen that is not relieved with medicine. Summary After the procedure, it is common to have a small amount of blood in your stool. You may also have mild cramping and bloating of your abdomen. If you were given a sedative during the procedure, it can affect you for several hours. Do not drive or operate machinery until your health care provider says that it is safe. Get help right away if you have a lot of blood in your stool, nausea or vomiting, a fever, or increased pain in your abdomen. This information is not intended to replace advice given to you by your health care provider. Make sure you discuss any questions you have with your health care provider. Document Revised: 12/25/2018 Document Reviewed: 07/27/2018 Elsevier Patient Education  [place] After This sheet gives you information about how to care for yourself after your procedure. Your health care provider may also give you more specific instructions. If you have problems or questions, contact your health care provider. What can I expect after the procedure? After the procedure, it is common to have: Tiredness. Forgetfulness about what happened after the procedure. Impaired judgment for important decisions. Nausea or vomiting. Some difficulty with balance. Follow these instructions at home: For the time period you were told by your health care provider:   Rest as needed. Do not participate in activities where you could fall or become injured. Do not drive or use machinery. Do not drink alcohol. Do not take sleeping pills or medicines that cause drowsiness. Do not make important decisions or sign legal documents. Do not take care of children on your own. Eating and drinking Follow the diet that is recommended by  your health care provider. Drink enough fluid to keep your urine pale yellow. If you vomit: Drink water, juice, or soup when you can drink without vomiting. Make sure you have little or no nausea before eating solid foods. General instructions Have a responsible adult stay with you for the time you are told. It is important to have someone help care for you until you are awake and alert. Take over-the-counter and prescription medicines only as told by your health care provider. If you have sleep apnea, surgery and certain medicines can increase your risk for breathing problems. Follow instructions from your health care provider about wearing your sleep device: Anytime you are sleeping, including during daytime naps. While taking prescription pain medicines, sleeping medicines, or medicines that make you drowsy. Avoid smoking. Keep all follow-up visits as told by your health care provider. This is important. Contact a health care provider if: You keep feeling nauseous or you keep vomiting. You feel light-headed. You  are still sleepy or having trouble with balance after 24 hours. You develop a rash. You have a fever. You have redness or swelling around the IV site. Get help right away if: You have trouble breathing. You have new-onset confusion at home. Summary For several hours after your procedure, you may feel tired. You may also be forgetful and have poor judgment. Have a responsible adult stay with you for the time you are told. It is important to have someone help care for you until you are awake and alert. Rest as told. Do not drive or operate machinery. Do not drink alcohol or take sleeping pills. Get help right away if you have trouble breathing, or if you suddenly become confused. This information is not intended to replace advice given to you by your health care provider. Make sure you discuss any questions you have with your health care provider. Document Revised: 09/16/2019  Document Reviewed: 12/03/2018 Elsevier Patient Education  2022 [employer].

## 2020-11-13 ENCOUNTER — Other Ambulatory Visit: Payer: Self-pay

## 2020-11-13 ENCOUNTER — Other Ambulatory Visit (HOSPITAL_COMMUNITY): Payer: Self-pay

## 2020-11-13 ENCOUNTER — Encounter (HOSPITAL_COMMUNITY)
Admission: RE | Admit: 2020-11-13 | Discharge: 2020-11-13 | Disposition: A | Discharge status: Home / Self Care | Payer: Medicare Other | Source: Ambulatory Visit | Attending: Internal Medicine | Admitting: Internal Medicine

## 2020-11-13 DIAGNOSIS — C563 Malignant neoplasm of bilateral ovaries: Secondary | ICD-10-CM

## 2020-11-13 MED ORDER — OXYCODONE HCL 10 MG PO TABS: 10.0000 mg | ORAL_TABLET | ORAL | 0 refills | Status: DC | PRN

## 2020-11-15 ENCOUNTER — Ambulatory Visit (HOSPITAL_COMMUNITY): Payer: Medicare Other | Admitting: Anesthesiology

## 2020-11-15 ENCOUNTER — Encounter (HOSPITAL_COMMUNITY)
Admission: RE | Disposition: A | Discharge status: Home / Self Care | Payer: Self-pay | Source: Home / Self Care | Attending: Internal Medicine

## 2020-11-15 ENCOUNTER — Ambulatory Visit (HOSPITAL_COMMUNITY)
Admission: RE | Admit: 2020-11-15 | Discharge: 2020-11-15 | Disposition: A | Discharge status: Home / Self Care | Payer: Medicare Other | Attending: Internal Medicine | Admitting: Internal Medicine

## 2020-11-15 ENCOUNTER — Encounter (HOSPITAL_COMMUNITY): Payer: Self-pay | Admitting: Internal Medicine

## 2020-11-15 ENCOUNTER — Other Ambulatory Visit: Payer: Self-pay

## 2020-11-15 DIAGNOSIS — Z8543 Personal history of malignant neoplasm of ovary: Secondary | ICD-10-CM | POA: Diagnosis not present

## 2020-11-15 DIAGNOSIS — Z87891 Personal history of nicotine dependence: Secondary | ICD-10-CM | POA: Insufficient documentation

## 2020-11-15 DIAGNOSIS — K589 Irritable bowel syndrome without diarrhea: Secondary | ICD-10-CM | POA: Insufficient documentation

## 2020-11-15 DIAGNOSIS — D500 Iron deficiency anemia secondary to blood loss (chronic): Secondary | ICD-10-CM | POA: Insufficient documentation

## 2020-11-15 DIAGNOSIS — K644 Residual hemorrhoidal skin tags: Secondary | ICD-10-CM | POA: Diagnosis not present

## 2020-11-15 DIAGNOSIS — K449 Diaphragmatic hernia without obstruction or gangrene: Secondary | ICD-10-CM | POA: Insufficient documentation

## 2020-11-15 DIAGNOSIS — D122 Benign neoplasm of ascending colon: Secondary | ICD-10-CM | POA: Diagnosis not present

## 2020-11-15 DIAGNOSIS — Z881 Allergy status to other antibiotic agents status: Secondary | ICD-10-CM | POA: Insufficient documentation

## 2020-11-15 DIAGNOSIS — Z880 Allergy status to penicillin: Secondary | ICD-10-CM | POA: Diagnosis not present

## 2020-11-15 DIAGNOSIS — K2100 Gastro-esophageal reflux disease with esophagitis, without bleeding: Secondary | ICD-10-CM | POA: Insufficient documentation

## 2020-11-15 DIAGNOSIS — E785 Hyperlipidemia, unspecified: Secondary | ICD-10-CM | POA: Insufficient documentation

## 2020-11-15 DIAGNOSIS — D120 Benign neoplasm of cecum: Secondary | ICD-10-CM | POA: Insufficient documentation

## 2020-11-15 DIAGNOSIS — Z7982 Long term (current) use of aspirin: Secondary | ICD-10-CM | POA: Diagnosis not present

## 2020-11-15 DIAGNOSIS — Z8379 Family history of other diseases of the digestive system: Secondary | ICD-10-CM | POA: Diagnosis not present

## 2020-11-15 DIAGNOSIS — K6289 Other specified diseases of anus and rectum: Secondary | ICD-10-CM | POA: Diagnosis not present

## 2020-11-15 DIAGNOSIS — Z79899 Other long term (current) drug therapy: Secondary | ICD-10-CM | POA: Diagnosis not present

## 2020-11-15 DIAGNOSIS — Z885 Allergy status to narcotic agent status: Secondary | ICD-10-CM | POA: Diagnosis not present

## 2020-11-15 DIAGNOSIS — Z8711 Personal history of peptic ulcer disease: Secondary | ICD-10-CM | POA: Diagnosis not present

## 2020-11-15 HISTORY — PX: POLYPECTOMY: SHX5525

## 2020-11-15 HISTORY — PX: COLONOSCOPY WITH PROPOFOL: SHX5780

## 2020-11-15 LAB — HM COLONOSCOPY

## 2020-11-15 SURGERY — COLONOSCOPY WITH PROPOFOL
Anesthesia: General

## 2020-11-15 MED ORDER — LACTATED RINGERS IV SOLN: INTRAVENOUS | Status: DC | PRN

## 2020-11-15 MED ORDER — PROPOFOL 10 MG/ML IV BOLUS
INTRAVENOUS | Status: DC | PRN
  Administered 2020-11-15: 100 mg via INTRAVENOUS

## 2020-11-15 MED ORDER — PROPOFOL 500 MG/50ML IV EMUL
INTRAVENOUS | Status: DC | PRN
  Administered 2020-11-15: 200 ug/kg/min via INTRAVENOUS

## 2020-11-15 NOTE — Discharge Instructions (Addendum)
No aspirin or NSAIDs for 24 hours. Resume usual medications and diet as before. No driving for 24 hours. Physician will call with biopsy results. Small bowel given capsule study to be scheduled.  Office will contact you.

## 2020-11-15 NOTE — H&P (Addendum)
Michelle Holt is an 50 y.o. female.   Chief Complaint: Patient is here for colonoscopy. HPI: Patient is 50 year old Caucasian female who has a history of ovarian carcinoma and under care of Dr. Delton Coombes.  She has a history of gastric ulcer erosive reflux esophagitis as well as moderate size sliding hiatal hernia.  She presented to Dr. Delton Coombes with melena and drop in her H&H.  Iron studies confirm iron deficiency anemia.  She underwent esophagogastroduodenoscopy about 16 days ago and she was noted to have moderate size hiatal hernia with few Lysbeth Galas lesions without stigmata of bleed.  We therefore recommended colonoscopy.  Last exam was normal in 2015. Patient denies nausea vomiting or hematemesis.  She says her stools at times is dark she denies rectal bleeding.  She has intermittent pain in left upper quadrant.  Her appetite is good and her weight has been stable.  She does not take OTC NSAIDs. She received a unit of PRBCs as well as iron infusion.  Her hemoglobin 1 week ago was 12.9. Family history is negative for colorectal carcinoma.  Past Medical History:  Diagnosis Date   Anemia    Anxiety and depression    Arthritis of facet joints at multiple vertebral levels    L5-S1   Constipation    Dyslipidemia    Family history of breast cancer    Family history of uterine cancer    GERD (gastroesophageal reflux disease)    History of hiatal hernia    History of kidney stones    Insomnia    Irritable bowel syndrome    Migraine    Muscle tension headache    Neuropathy of finger    Ovarian carcinoma (HCC)    ovarian   Plantar fasciitis of right foot    Port-A-Cath in place 08/20/2018    Past Surgical History:  Procedure Laterality Date   ABDOMINAL HYSTERECTOMY     BIOPSY  10/27/2020   Procedure: BIOPSY;  Surgeon: Harvel Quale, MD;  [facility];  Service: Gastroenterology;;   CHOLECYSTECTOMY  2008   COLONOSCOPY N/A 08/13/2013   Procedure: COLONOSCOPY;   Surgeon: Rogene Houston, MD;  [facility];  Service: Endoscopy;  Laterality: N/A;  230-moved to 145 Ann to notify pt   ESOPHAGOGASTRODUODENOSCOPY     ESOPHAGOGASTRODUODENOSCOPY (EGD) WITH PROPOFOL N/A 07/20/2018   Procedure: ESOPHAGOGASTRODUODENOSCOPY (EGD) WITH PROPOFOL;  Surgeon: Rogene Houston, MD;  [facility];  Service: Endoscopy;  Laterality: N/A;  Possible esophageal dilation.   ESOPHAGOGASTRODUODENOSCOPY (EGD) WITH PROPOFOL N/A 10/27/2020   Procedure: ESOPHAGOGASTRODUODENOSCOPY (EGD) WITH PROPOFOL;  Surgeon: Harvel Quale, MD;  [facility];  Service: Gastroenterology;  Laterality: N/A;  2:10, pt knows to arrive at 10:15   IR [place]  08/01/2018   IR ANGIOGRAM VISCERAL SELECTIVE  08/01/2018   IR EMBO ART  VEN HEMORR LYMPH EXTRAV  INC GUIDE ROADMAPPING  08/01/2018   IR IMAGING GUIDED PORT INSERTION  08/20/2018   IR PERC PLEURAL DRAIN W/INDWELL CATH W/IMG GUIDE  07/08/2018   IR THORACENTESIS ASP PLEURAL SPACE W/IMG GUIDE  07/07/2018   IR US GUIDE VASC ACCESS RIGHT  08/01/2018   PLEURAL EFFUSION DRAINAGE Left 07/13/2018   Procedure: DRAINAGE OF LOCULATED PLEURAL EFFUSION;  Surgeon: Ivin Poot, MD;  Location: [place];  Service: Thoracic;  Laterality: Left;   REMOVAL OF PLEURAL DRAINAGE CATHETER Left 08/20/2018   Procedure: REMOVAL OF PLEURAL DRAINAGE CATHETER;  Surgeon: Ivin Poot, MD;  Location: [facility];  Service: Thoracic;  Laterality: Left;   REMOVAL OF PLEURAL DRAINAGE CATHETER Left 08/20/2018   Procedure: REMOVAL OF PLEURAL DRAINAGE CATHETER;  Surgeon: Ivin Poot, MD;  Location: [place];  Service: Thoracic;  Laterality: Left;   TALC PLEURODESIS Left 07/13/2018   Procedure: Talc Pleuradesis;  Surgeon: Prescott Gum, Collier Salina, MD;  Location: [place];  Service: Thoracic;  Laterality: Left;   TOTAL HIP ARTHROPLASTY Left 06/05/2020   Procedure: LEFT TOTAL HIP ARTHROPLASTY ANTERIOR APPROACH;  Surgeon: Leandrew Koyanagi, MD;   Location: [place];  Service: Orthopedics;  Laterality: Left;  3-C   TUBAL LIGATION Bilateral    UTERINE ABLATION     VIDEO ASSISTED THORACOSCOPY Left 07/13/2018   Procedure: VIDEO ASSISTED THORACOSCOPY;  Surgeon: Prescott Gum, Collier Salina, MD;  Location: [facility];  Service: Thoracic;  Laterality: Left;    Family History  Problem Relation Age of Onset   Hypertension Mother    Obesity Mother    Diabetes Mother    Kidney disease Mother    Peripheral vascular disease Father    Atrial fibrillation Father    COPD Brother    Osteoporosis Brother    Crohn's disease Sister    Uterine cancer Sister 70       maternal half sister   Breast cancer Maternal Aunt 79   Colon cancer Neg Hx    Social History:  reports that she quit smoking about 2 years ago. Her smoking use included cigarettes. She has a 8.50 pack-year smoking history. She has never used smokeless tobacco. She reports current alcohol use of about 1.0 standard drink per week. She reports that she does not use drugs.  Allergies:  Allergies  Allergen Reactions   Morphine And Related Itching   Nickel Itching   Nortriptyline Other (See Comments)    Significant weight gain   Topamax [Topiramate] Diarrhea and Nausea Only   Xanax [Alprazolam]     Can't wake up    Actifed Cold-Allergy [Chlorpheniramine-Phenyleph Er] Rash   Amoxicillin Rash    Did it involve swelling of the face/tongue/throat, SOB, or low BP? Unknown Did it involve sudden or severe rash/hives, skin peeling, or any reaction on the inside of your mouth or nose? Unknown Did you need to seek medical attention at a hospital or doctor's office? Unknown When did it last happen? teenager       If all above answers are "NO", may proceed with cephalosporin use.    Codeine Hives   Erythromycin Rash   Penicillins Rash    Did it involve swelling of the face/tongue/throat, SOB, or low BP? Unknown Did it involve sudden or severe rash/hives, skin peeling, or any reaction on the inside of your  mouth or nose? Unknown Did you need to seek medical attention at a hospital or doctor's office? Unknown When did it last happen? teenager       If all above answers are "NO", may proceed with cephalosporin use.    Sudafed [Pseudoephedrine Hcl] Rash    Medications Prior to Admission  Medication Sig Dispense Refill   omeprazole (PRILOSEC) 40 MG capsule Take 1 capsule (40 mg total) by mouth 2 (two) times daily. 180 capsule 3   Oxycodone HCl 10 MG TABS Take 1 tablet (10 mg total) by mouth every 4 (four) hours as needed. 168 tablet 0   albuterol (VENTOLIN HFA) 108 (90 Base) MCG/ACT inhaler Inhale 2 puffs into the lungs every 6 (six) hours as needed for wheezing or shortness of breath.  Ascorbic Acid (VITAMIN C) 1000 MG tablet Take 2,000 mg by mouth daily.     aspirin EC 81 MG tablet Take 1 tablet (81 mg total) by mouth 2 (two) times daily. (Patient not taking: No sig reported) 84 tablet 0   B Complex-C (B-COMPLEX WITH VITAMIN C) tablet Take 1 tablet by mouth daily.     Cholecalciferol (VITAMIN D) 50 MCG (2000 UT) CAPS Take 2,000 Units by mouth daily.     diazepam (VALIUM) 5 MG tablet TAKE ONE TABLET BY MOUTH AT BEDTIME (Patient taking differently: Take 5 mg by mouth at bedtime.) 30 tablet 5   docusate sodium (COLACE) 100 MG capsule Take 1 capsule (100 mg total) by mouth daily as needed. To be taken after surgery 30 capsule 2   gabapentin (NEURONTIN) 100 MG capsule TAKE TWO (2) CAPSULES BY MOUTH AT BEDTIME 60 capsule 2   lidocaine-prilocaine (EMLA) cream Apply small amount to port a cath site and cover with plastic wrap one hour prior to chemotherapy appointments 30 g 3   Magnesium 500 MG TABS Take 500 mg by mouth in the morning.     ondansetron (ZOFRAN) 4 MG tablet Take 1 tablet (4 mg total) by mouth every 8 (eight) hours as needed for nausea or vomiting. 40 tablet 0   polyethylene glycol-electrolytes (TRILYTE) 420 g solution Take 4,000 mLs by mouth as directed. 4000 mL 0   sennosides-docusate  sodium (SENOKOT-S) 8.6-50 MG tablet Take 1 tablet by mouth daily as needed for constipation.     SUMAtriptan (IMITREX) 100 MG tablet TAKE ONE TABLET BY MOUTH PRN UP to TWICE DAILY AS NEEDED. 9 tablet 5   tiZANidine (ZANAFLEX) 4 MG tablet Take 1 tablet (4 mg total) by mouth 3 (three) times daily as needed for muscle spasms. (Patient taking differently: Take 4 mg by mouth at bedtime.) 60 tablet 0   triamcinolone cream (KENALOG) 0.1 % Apply 1 application topically 2 (two) times daily as needed (rash). 80 g 11   venlafaxine XR (EFFEXOR-XR) 150 MG 24 hr capsule TAKE ONE CAPSULE BY MOUTH DAILY WITH BREAKFAST (Patient taking differently: Take 150 mg by mouth at bedtime.) 30 capsule 6    No results found for this or any previous visit (from the past 48 hour(s)). No results found.  Review of Systems  Blood pressure 110/77, pulse 76, temperature 98.2 F (36.8 C), temperature source Oral, resp. rate 16, SpO2 98 %. Physical Exam HENT:     Mouth/Throat:     Mouth: Mucous membranes are moist.     Pharynx: Oropharynx is clear.  Eyes:     General: No scleral icterus.    Conjunctiva/sclera: Conjunctivae normal.  Cardiovascular:     Rate and Rhythm: Normal rate and regular rhythm.     Heart sounds: Normal heart sounds. No murmur heard. Pulmonary:     Effort: Pulmonary effort is normal.     Breath sounds: Normal breath sounds.  Abdominal:     Comments: Abdomen is symmetrical with laparoscopy scars.  Xiphisternum is tender.  Abdomen is soft with mild tenderness below left costal margin.  No organomegaly or masses.  Musculoskeletal:        General: No swelling.     Cervical back: Neck supple.  Lymphadenopathy:     Cervical: No cervical adenopathy.  Skin:    General: Skin is warm and dry.  Neurological:     Mental Status: She is alert.     Assessment/Plan  History of melena and iron deficiency anemia. EGD  did not reveal evidence of GI blood loss. Diagnostic colonoscopy.  Hildred Laser,  MD 11/15/2020, 12:37 PM

## 2020-11-15 NOTE — Anesthesia Postprocedure Evaluation (Signed)
Anesthesia Post Note  Patient: Michelle Holt  Procedure(s) Performed: COLONOSCOPY WITH PROPOFOL POLYPECTOMY  Patient location during evaluation: Phase II Anesthesia Type: General Level of consciousness: awake Pain management: pain level controlled Vital Signs Assessment: post-procedure vital signs reviewed and stable Respiratory status: spontaneous breathing and respiratory function stable Cardiovascular status: blood pressure returned to baseline and stable Postop Assessment: no headache and no apparent nausea or vomiting Anesthetic complications: no Comments: Late entry   No notable events documented.   Last Vitals:  Vitals:   11/15/20 1118 11/15/20 1320  BP: 110/77 (!) 99/47  Pulse:  81  Resp:  17  Temp:  36.5 C  SpO2:  98%    Last Pain:  Vitals:   11/15/20 1320  TempSrc: Oral  PainSc: 0-No pain                 Louann Sjogren

## 2020-11-15 NOTE — Op Note (Signed)
[facility] Patient Name: Michelle Holt Procedure Date: 11/15/2020 12:42 PM MRN: [id number] Date of Birth: 1970-06-16 Attending MD: Hildred Laser , MD CSN: [id number] Age: 50 Admit Type: Outpatient Procedure:                Colonoscopy Indications:              Iron deficiency anemia secondary to chronic blood                            loss Providers:                Hildred Laser, MD, Caprice Kluver, Nelma Rothman, Technician Referring MD:             Derek Jack, MD and Jerene Bears, MD Medicines:                Propofol per Anesthesia Complications:            No immediate complications. Estimated Blood Loss:     Estimated blood loss was minimal. Procedure:                Pre-Anesthesia Assessment:                           - Prior to the procedure, a History and Physical                            was performed, and patient medications and                            allergies were reviewed. The patient's tolerance of                            previous anesthesia was also reviewed. The risks                            and benefits of the procedure and the sedation                            options and risks were discussed with the patient.                            All questions were answered, and informed consent                            was obtained. Prior Anticoagulants: The patient has                            taken no previous anticoagulant or antiplatelet                            agents. ASA Grade Assessment: II - A patient with                            mild systemic disease. After reviewing the risks  and benefits, the patient was deemed in                            satisfactory condition to undergo the procedure.                           After obtaining informed consent, the colonoscope                            was passed under direct vision. Throughout the                            procedure, the patient's blood pressure, pulse,  and                            oxygen saturations were monitored continuously. The                            [id number]-HQ190L ([id number]) scope was introduced through                            the anus and advanced to the the terminal ileum,                            with identification of the appendiceal orifice and                            IC valve. The colonoscopy was performed without                            difficulty. The patient tolerated the procedure                            well. The quality of the bowel preparation was                            good. The terminal ileum, ileocecal valve,                            appendiceal orifice, and rectum were photographed. Scope In: 12:48:59 PM Scope Out: 1:10:47 PM Scope Withdrawal Time: 0 hours 14 minutes 42 seconds  Total Procedure Duration: 0 hours 21 minutes 48 seconds  Findings:      The perianal and digital rectal examinations were normal.      The terminal ileum appeared normal.      Two flat polyps were found in the ascending colon and cecum. The polyps       were 3 to 5 mm in size. These polyps were removed with a cold snare.       Resection and retrieval were complete. The pathology specimen was placed       into Bottle Number 1.      The exam was otherwise normal throughout the examined colon.      External hemorrhoids were found during retroflexion. The hemorrhoids       were small.  Anal papilla(e) were hypertrophied. Impression:               - The examined portion of the ileum was normal.                           - Two 3 to 5 mm polyps in the ascending colon and                            in the cecum, removed with a cold snare. Resected                            and retrieved.                           - External hemorrhoids.                           - Anal papilla(e) were hypertrophied. Moderate Sedation:      Per Anesthesia Care Recommendation:           - Patient has a contact number available for                             emergencies. The signs and symptoms of potential                            delayed complications were discussed with the                            patient. Return to normal activities tomorrow.                            Written discharge instructions were provided to the                            patient.                           - Resume previous diet today.                           - Continue present medications.                           - Await pathology results.                           - Repeat colonoscopy for surveillance based on                            pathology results.                           - To visualize the small bowel, perform video                            capsule endoscopy at  appointment to be scheduled. Procedure Code(s):        --- Professional ---                           [phone], Colonoscopy, flexible; with removal of                            tumor(s), polyp(s), or other lesion(s) by snare                            technique Diagnosis Code(s):        --- Professional ---                           K64.4, Residual hemorrhoidal skin tags                           K63.5, Polyp of colon                           K62.89, Other specified diseases of anus and rectum                           D50.0, Iron deficiency anemia secondary to blood                            loss (chronic) CPT copyright 2019 American Medical Association. All rights reserved. The codes documented in this report are preliminary and upon coder review may  be revised to meet current compliance requirements. Hildred Laser, MD Hildred Laser, MD 11/15/2020 1:19:16 PM This report has been signed electronically. Number of Addenda: 0

## 2020-11-15 NOTE — Transfer of Care (Signed)
Immediate Anesthesia Transfer of Care Note  Patient: Michelle Holt  Procedure(s) Performed: COLONOSCOPY WITH PROPOFOL POLYPECTOMY  Patient Location: PACU  Anesthesia Type:General  Level of Consciousness: awake, alert , oriented and patient cooperative  Airway & Oxygen Therapy: Patient Spontanous Breathing  Post-op Assessment: Report given to RN, Post -op Vital signs reviewed and stable and Patient moving all extremities X 4  Post vital signs: Reviewed and stable  Last Vitals:  Vitals Value Taken Time  BP    Temp    Pulse    Resp    SpO2      Last Pain:  Vitals:   11/15/20 1243  TempSrc:   PainSc: 4       Patients Stated Pain Goal: 6 (21/97/58 8325)  Complications: No notable events documented.

## 2020-11-15 NOTE — Anesthesia Preprocedure Evaluation (Signed)
Anesthesia Evaluation  Patient identified by MRN, date of birth, ID band Patient awake    Reviewed: Allergy & Precautions, H&P , NPO status , Patient's Chart, lab work & pertinent test results, reviewed documented beta blocker date and time   Airway Mallampati: II  TM Distance: >3 FB Neck ROM: full    Dental no notable dental hx. (+) Teeth Intact   Pulmonary neg pulmonary ROS, former smoker,    Pulmonary exam normal breath sounds clear to auscultation       Cardiovascular Exercise Tolerance: Good negative cardio ROS   Rhythm:regular Rate:Normal     Neuro/Psych  Headaches, PSYCHIATRIC DISORDERS Anxiety Depression  Neuromuscular disease    GI/Hepatic Neg liver ROS, hiatal hernia, GERD  Medicated,  Endo/Other  negative endocrine ROS  Renal/GU negative Renal ROS  negative genitourinary   Musculoskeletal   Abdominal   Peds  Hematology  (+) Blood dyscrasia, anemia ,   Anesthesia Other Findings   Reproductive/Obstetrics negative OB ROS                             Anesthesia Physical Anesthesia Plan  ASA: 2  Anesthesia Plan: General   Post-op Pain Management:    Induction:   PONV Risk Score and Plan: Propofol infusion  Airway Management Planned:   Additional Equipment:   Intra-op Plan:   Post-operative Plan:   Informed Consent: I have reviewed the patients History and Physical, chart, labs and discussed the procedure including the risks, benefits and alternatives for the proposed anesthesia with the patient or authorized representative who has indicated his/her understanding and acceptance.     Dental Advisory Given  Plan Discussed with: CRNA  Anesthesia Plan Comments:         Anesthesia Quick Evaluation

## 2020-11-16 ENCOUNTER — Inpatient Hospital Stay (HOSPITAL_COMMUNITY): Payer: Medicare Other | Attending: Hematology

## 2020-11-16 ENCOUNTER — Inpatient Hospital Stay (HOSPITAL_COMMUNITY): Payer: Medicare Other

## 2020-11-16 ENCOUNTER — Encounter (HOSPITAL_COMMUNITY): Payer: Self-pay

## 2020-11-16 VITALS — BP 110/74 | HR 94 | Temp 97.6°F | Resp 18

## 2020-11-16 DIAGNOSIS — Z90722 Acquired absence of ovaries, bilateral: Secondary | ICD-10-CM | POA: Insufficient documentation

## 2020-11-16 DIAGNOSIS — Z79899 Other long term (current) drug therapy: Secondary | ICD-10-CM | POA: Insufficient documentation

## 2020-11-16 DIAGNOSIS — Z95828 Presence of other vascular implants and grafts: Secondary | ICD-10-CM

## 2020-11-16 DIAGNOSIS — Z452 Encounter for adjustment and management of vascular access device: Secondary | ICD-10-CM | POA: Insufficient documentation

## 2020-11-16 DIAGNOSIS — Z9079 Acquired absence of other genital organ(s): Secondary | ICD-10-CM | POA: Insufficient documentation

## 2020-11-16 DIAGNOSIS — R1012 Left upper quadrant pain: Secondary | ICD-10-CM | POA: Insufficient documentation

## 2020-11-16 DIAGNOSIS — Z90710 Acquired absence of both cervix and uterus: Secondary | ICD-10-CM | POA: Diagnosis not present

## 2020-11-16 DIAGNOSIS — Z87891 Personal history of nicotine dependence: Secondary | ICD-10-CM | POA: Diagnosis not present

## 2020-11-16 DIAGNOSIS — R971 Elevated cancer antigen 125 [CA 125]: Secondary | ICD-10-CM | POA: Diagnosis present

## 2020-11-16 DIAGNOSIS — Z8543 Personal history of malignant neoplasm of ovary: Secondary | ICD-10-CM | POA: Insufficient documentation

## 2020-11-16 DIAGNOSIS — R232 Flushing: Secondary | ICD-10-CM | POA: Diagnosis not present

## 2020-11-16 DIAGNOSIS — G629 Polyneuropathy, unspecified: Secondary | ICD-10-CM | POA: Insufficient documentation

## 2020-11-16 DIAGNOSIS — D649 Anemia, unspecified: Secondary | ICD-10-CM | POA: Diagnosis not present

## 2020-11-16 DIAGNOSIS — C563 Malignant neoplasm of bilateral ovaries: Secondary | ICD-10-CM

## 2020-11-16 LAB — COMPREHENSIVE METABOLIC PANEL
ALT: 16 U/L (ref 0–44)
AST: 21 U/L (ref 15–41)
Albumin: 3.5 g/dL (ref 3.5–5.0)
Alkaline Phosphatase: 102 U/L (ref 38–126)
Anion gap: 7 (ref 5–15)
BUN: 8 mg/dL (ref 6–20)
CO2: 27 mmol/L (ref 22–32)
Calcium: 8.9 mg/dL (ref 8.9–10.3)
Chloride: 107 mmol/L (ref 98–111)
Creatinine, Ser: 0.7 mg/dL (ref 0.44–1.00)
GFR, Estimated: >60 mL/min (ref >60)
Glucose, Bld: 138 mg/dL — ABNORMAL HIGH (ref 70–99)
Potassium: 3.7 mmol/L (ref 3.5–5.1)
Sodium: 141 mmol/L (ref 135–145)
Total Bilirubin: 0.5 mg/dL (ref 0.3–1.2)
Total Protein: 6.1 g/dL — ABNORMAL LOW (ref 6.5–8.1)

## 2020-11-16 LAB — CBC WITH DIFFERENTIAL/PLATELET
Abs Immature Granulocytes: 0.01 10*3/uL (ref 0.00–0.07)
Basophils Absolute: 0 10*3/uL (ref 0.0–0.1)
Basophils Relative: 1 %
Eosinophils Absolute: 0.2 10*3/uL (ref 0.0–0.5)
Eosinophils Relative: 3 %
HCT: 37.9 % (ref 36.0–46.0)
Hemoglobin: 12.3 g/dL (ref 12.0–15.0)
Immature Granulocytes: 0 %
Lymphocytes Relative: 33 %
Lymphs Abs: 2.4 10*3/uL (ref 0.7–4.0)
MCH: 34.2 pg — ABNORMAL HIGH (ref 26.0–34.0)
MCHC: 32.5 g/dL (ref 30.0–36.0)
MCV: 105.3 fL — ABNORMAL HIGH (ref 80.0–100.0)
Monocytes Absolute: 0.5 10*3/uL (ref 0.1–1.0)
Monocytes Relative: 7 %
Neutro Abs: 4 10*3/uL (ref 1.7–7.7)
Neutrophils Relative %: 56 %
Platelets: 244 10*3/uL (ref 150–400)
RBC: 3.6 MIL/uL — ABNORMAL LOW (ref 3.87–5.11)
RDW: 20.2 % — ABNORMAL HIGH (ref 11.5–15.5)
WBC: 7.2 10*3/uL (ref 4.0–10.5)
nRBC: 0 % (ref 0.0–0.2)

## 2020-11-16 LAB — MAGNESIUM: Magnesium: 1.8 mg/dL (ref 1.7–2.4)

## 2020-11-16 MED ORDER — SODIUM CHLORIDE 0.9% FLUSH
10.0000 mL | Freq: Once | INTRAVENOUS | Status: AC
  Administered 2020-11-16: 10 mL via INTRAVENOUS

## 2020-11-16 MED ORDER — HEPARIN SOD (PORK) LOCK FLUSH 100 UNIT/ML IV SOLN
500.0000 [IU] | Freq: Once | INTRAVENOUS | Status: AC
  Administered 2020-11-16: 500 [IU] via INTRAVENOUS

## 2020-11-16 NOTE — Patient Instructions (Signed)
[facility]  Discharge Instructions: °Thank you for choosing [facility] to provide your oncology and hematology care.  °If you have a lab appointment with the Cancer Center, please come in thru the Main Entrance and check in at the main information desk. ° °Wear comfortable clothing and clothing appropriate for easy access to any Portacath or PICC line.  ° °We strive to give you quality time with your provider. You may need to reschedule your appointment if you arrive late (15 or more minutes).  Arriving late affects you and other patients whose appointments are after yours.  Also, if you miss three or more appointments without notifying the office, you may be dismissed from the clinic at the provider’s discretion.    °  °For prescription refill requests, have your pharmacy contact our office and allow 72 hours for refills to be completed.   ° °Today your port was flushed and labs were drawn. Return as scheduled. °  °To help prevent nausea and vomiting after your treatment, we encourage you to take your nausea medication as directed. ° °BELOW ARE SYMPTOMS THAT SHOULD BE REPORTED IMMEDIATELY: °*FEVER GREATER THAN 100.4 F (38 °C) OR HIGHER °*CHILLS OR SWEATING °*NAUSEA AND VOMITING THAT IS NOT CONTROLLED WITH YOUR NAUSEA MEDICATION °*UNUSUAL SHORTNESS OF BREATH °*UNUSUAL BRUISING OR BLEEDING °*URINARY PROBLEMS (pain or burning when urinating, or frequent urination) °*BOWEL PROBLEMS (unusual diarrhea, constipation, pain near the anus) °TENDERNESS IN MOUTH AND THROAT WITH OR WITHOUT PRESENCE OF ULCERS (sore throat, sores in mouth, or a toothache) °UNUSUAL RASH, SWELLING OR PAIN  °UNUSUAL VAGINAL DISCHARGE OR ITCHING  ° °Items with * indicate a potential emergency and should be followed up as soon as possible or go to the Emergency Department if any problems should occur. ° °Please show the CHEMOTHERAPY ALERT CARD or IMMUNOTHERAPY ALERT CARD at check-in to the Emergency Department and triage  nurse. ° °Should you have questions after your visit or need to cancel or reschedule your appointment, please contact [facility] [phone]  and follow the prompts.  Office hours are 8:00 a.m. to 4:30 p.m. Monday - Friday. Please note that voicemails left after 4:00 p.m. may not be returned until the following business day.  We are closed weekends and major holidays. You have access to a nurse at all times for urgent questions. Please call the main number to the clinic [phone] and follow the prompts. ° °For any non-urgent questions, you may also contact your provider using MyChart. We now offer e-Visits for anyone 18 and older to request care online for non-urgent symptoms. For details visit [email or web address]. °  °Also download the MyChart app! Go to the app store, search "MyChart", open the app, select [facility], and log in with your MyChart username and password. ° °Due to Covid, a mask is required upon entering the hospital/clinic. If you do not have a mask, one will be given to you upon arrival. For doctor visits, patients may have 1 support person aged 18 or older with them. For treatment visits, patients cannot have anyone with them due to current Covid guidelines and our immunocompromised population.  °

## 2020-11-17 LAB — SURGICAL PATHOLOGY

## 2020-11-17 LAB — CA 125: Cancer Antigen (CA) 125: 59.2 U/mL — ABNORMAL HIGH (ref 0.0–38.1)

## 2020-11-20 ENCOUNTER — Encounter (INDEPENDENT_AMBULATORY_CARE_PROVIDER_SITE_OTHER): Payer: Self-pay

## 2020-11-20 ENCOUNTER — Encounter (HOSPITAL_COMMUNITY): Payer: Self-pay | Admitting: Internal Medicine

## 2020-11-20 NOTE — Progress Notes (Signed)
Michelle Holt, [place] [address]   CLINIC:  Medical Oncology/Hematology  PCP:  [name], MD [address] / [place] [address] [phone]   REASON FOR VISIT:  Follow-up for high-grade serous ovarian cancer  PRIOR THERAPY:  1. Carboplatin and paclitaxel x 7 cycles from 08/24/2018 to 03/15/2019. 2. Laparoscopic TAH & BSO & omenectomy on 12/24/2018.  NGS Results: not done  CURRENT THERAPY: surveillance  BRIEF ONCOLOGIC HISTORY:  Oncology History  Ovarian cancer, bilateral ([place])  07/07/2018 Pathology Results   PLEURAL FLUID, LEFT (SPECIMEN 1 OF 1, COLLECTED 07/07/18): - MALIGNANT CELLS CONSISTENT WITH ADENOCARCINOMA - SEE COMMENT  Source Pleural Fluid, (Specimen 1 of 1, collected on 07/07/2018) Gross Specimen: Received is/are 1000cc of bloody red fluid with tissue. (TC:tc) Prepared: # Smears: 0 # Concentration Technique Slides (i.e. ThinPrep): 1 # Cell Block: 1 Conventional Additional Studies: Two Hematology slides labeled [id number] Comment The malignant cells are positive for cytokeratin 7, p53, WT-1, Pax-8, Moc31, ER (weak) and EMA but negative for cytokeratin 20, TTF-1, GATA-3, CDX-2 and D2-40. Overall, the phenotype is consistent with a gynecologic primary; clinical correlation recommended.   07/07/2018 Procedure   Successful ultrasound guided left thoracentesis yielding 2.0 L of pleural fluid   07/08/2018 Procedure   1. Technically successful placement of left 14 French pigtail chest drain, placed to Pleur-evac water-seal.   07/08/2018 Procedure   1. Technically successful five [place] double lumen power injectable PICC placement   07/09/2018 Imaging   Ct chest 1. There is a moderate, loculated left hydropneumothorax with a small air component and moderate fluid component. The largest loculated component is located posteriorly. There is a pigtail drainage catheter about the lateral pleural space. There is no obvious etiology, such as  obvious mass or pleural disease.   2. There is a small right pleural effusion with associated atelectasis or consolidation and a subpleural consolidation of the superior segment right lower lobe (series 4, image 56), of uncertain significance, possibly infectious or inflammatory   07/10/2018 Imaging   Ct abdomen and pelvis: 1. The bilateral ovaries are enlarged by heterogeneous appearing cystic lesions, measuring 5.3 x 4.2 cm on the right (series 4, image 72) and 4.5 x 3.2 cm on the left (series 4, image 75). Consider dedicated pelvic ultrasound and/or pelvic MRI to further evaluate for solid components given high suspicion for GYN primary malignancy.   2. No other evidence of mass and no lymphadenopathy in the abdomen or pelvis.   3. Trace ascites. There is some suggestion of omental and peritoneal nodularity (e.g. Series 4, image 74), concerning for peritoneal metastatic disease.    4. Loculated left-sided pleural effusion with left-sided pleural drainage catheter in position. Small right pleural effusion   07/13/2018 Surgery   OPERATION: 1.  Left VATS (video-assisted thoracoscopic surgery) for drainage of loculated pleural effusion. 2.  Talc pleurodesis for malignant pleural effusion. 3.  Placement of PleurX catheter for management of malignant pleural effusion. 4.  Placement of On-Q analgesia catheter system.    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   07/13/2018 Pathology Results   Pleura, peel, Left Pleural - FIBRO-FIBRINOUS PLEURITIS - NEGATIVE FOR MALIGNANCY   07/18/2018 Initial Diagnosis   Ovarian cancer, bilateral ([place])   07/20/2018 Procedure   EGD impression: Normal proximal esophagus and mid esophagus. Mild distal  esophageal rings; dilation not performed because of esophagitis. LA Grade  C reflux esophagitis. Z-line regular, 30 cm from the incisors. 5 cm hiatal hernia. Non-bleeding gastric ulcer with no stigmata of bleeding. Gastritis. Duodenal erosions without bleeding. Normal second portion of the duodenum. No specimens collected.   08/19/2018 Genetic Testing   Negative genetic testing on the common hereditary cancer panel.  The Common Hereditary Gene Panel offered by [facility] includes sequencing and/or deletion duplication testing of the following 48 genes: APC, ATM, AXIN2, BARD1, BMPR1A, BRCA1, BRCA2, BRIP1, CDH1, CDK4, CDKN2A (p14ARF), CDKN2A (p16INK4a), CHEK2, CTNNA1, DICER1, EPCAM (Deletion/duplication testing only), GREM1 (promoter region deletion/duplication testing only), KIT, MEN1, MLH1, MSH2, MSH3, MSH6, MUTYH, NBN, NF1, NHTL1, PALB2, PDGFRA, PMS2, POLD1, POLE, PTEN, RAD50, RAD51C, RAD51D, RNF43, SDHB, SDHC, SDHD, SMAD4, SMARCA4. STK11, TP53, TSC1, TSC2, and VHL.  The following genes were evaluated for sequence changes only: SDHA and HOXB13 c.251G>A variant only. The report date is August 19, 2018.   08/24/2018 -  Chemotherapy   The patient had palonosetron (ALOXI) injection 0.25 mg, 0.25 mg, Intravenous,  Once, 7 of 7 cycles Administration: 0.25 mg (08/24/2018), 0.25 mg (09/14/2018), 0.25 mg (10/05/2018), 0.25 mg (12/01/2018), 0.25 mg (01/27/2019), 0.25 mg (02/22/2019), 0.25 mg (03/15/2019) CARBOplatin (PARAPLATIN) 740 mg in sodium chloride 0.9 % 250 mL chemo infusion, 740 mg (100 % of original dose 742.8 mg), Intravenous,  Once, 7 of 7 cycles Dose modification:   (original dose 742.8 mg, Cycle 1),   (original dose 736.8 mg, Cycle 2),   (original dose 736.8 mg, Cycle 3),   (original dose 736.8 mg, Cycle 4),   (original dose 736.8 mg, Cycle 5),   (original dose 736.8 mg, Cycle 6),   (original dose 736.8 mg, Cycle 7) Administration: 740 mg (08/24/2018), 740 mg (09/14/2018), 740 mg (10/05/2018), 740 mg (12/01/2018), 740 mg (01/27/2019), 740 mg (02/22/2019), 740 mg (03/15/2019) PACLitaxel  (TAXOL) 324 mg in sodium chloride 0.9 % 500 mL chemo infusion (> $RemoveBef'80mg'gFAgRHWjck$ /m2), 175 mg/m2 = 324 mg, Intravenous,  Once, 7 of 7 cycles Administration: 324 mg (08/24/2018), 324 mg (09/14/2018), 324 mg (10/05/2018), 324 mg (12/01/2018), 324 mg (01/27/2019), 324 mg (02/22/2019), 324 mg (03/15/2019) fosaprepitant (EMEND) 150 mg, dexamethasone (DECADRON) 12 mg in sodium chloride 0.9 % 145 mL IVPB, , Intravenous,  Once, 7 of 7 cycles Administration:  (08/24/2018),  (09/14/2018),  (10/05/2018),  (12/01/2018),  (01/27/2019),  (02/22/2019),  (03/15/2019)   for chemotherapy treatment.       CANCER STAGING: Cancer Staging No matching staging information was found for the patient.  INTERVAL HISTORY:  Michelle Holt, a 50 y.o. female, returns for routine follow-up of her high-grade serous ovarian cancer. Wladyslawa was last seen on 10/12/2020.   Today she reports feeling good. She reports abdominal cramping, and she is taking oxycodone about 4 times a day. She reports improved energy and SOB. She denies black and dark stools.   REVIEW OF SYSTEMS:  Review of Systems  Constitutional:  Negative for appetite change (75%) and fatigue (60%).  Respiratory:  Negative for shortness of breath (improved).   Gastrointestinal:  Positive for abdominal pain (cramping) and nausea.  Musculoskeletal:  Positive for arthralgias (7/10 legs).  Neurological:  Positive for numbness (fingers of R hand).  All other systems reviewed and are negative.  PAST MEDICAL/SURGICAL HISTORY:  Past Medical History:  Diagnosis Date   Anemia    Anxiety and depression    Arthritis of facet joints at multiple vertebral levels    L5-S1   Constipation  Dyslipidemia    Family history of breast cancer    Family history of uterine cancer    GERD (gastroesophageal reflux disease)    History of hiatal hernia    History of kidney stones    Insomnia    Irritable bowel syndrome    Migraine    Muscle tension headache    Neuropathy of finger    Ovarian  carcinoma ([facility])    ovarian   Plantar fasciitis of right foot    Port-A-Cath in place 08/20/2018   Past Surgical History:  Procedure Laterality Date   ABDOMINAL HYSTERECTOMY     BIOPSY  10/27/2020   Procedure: BIOPSY;  Surgeon: Harvel Quale, MD;  [facility];  Service: Gastroenterology;;   CHOLECYSTECTOMY  2008   COLONOSCOPY N/A 08/13/2013   Procedure: COLONOSCOPY;  Surgeon: Rogene Houston, MD;  [facility];  Service: Endoscopy;  Laterality: N/A;  230-moved to 145 Ann to notify pt   COLONOSCOPY WITH PROPOFOL N/A 11/15/2020   Procedure: COLONOSCOPY WITH PROPOFOL;  Surgeon: Rogene Houston, MD;  [facility];  Service: Endoscopy;  Laterality: N/A;  1:40   ESOPHAGOGASTRODUODENOSCOPY     ESOPHAGOGASTRODUODENOSCOPY (EGD) WITH PROPOFOL N/A 07/20/2018   Procedure: ESOPHAGOGASTRODUODENOSCOPY (EGD) WITH PROPOFOL;  Surgeon: Rogene Houston, MD;  [facility];  Service: Endoscopy;  Laterality: N/A;  Possible esophageal dilation.   ESOPHAGOGASTRODUODENOSCOPY (EGD) WITH PROPOFOL N/A 10/27/2020   Procedure: ESOPHAGOGASTRODUODENOSCOPY (EGD) WITH PROPOFOL;  Surgeon: Harvel Quale, MD;  [facility];  Service: Gastroenterology;  Laterality: N/A;  2:10, pt knows to arrive at 10:15   IR [place]  08/01/2018   IR ANGIOGRAM VISCERAL SELECTIVE  08/01/2018   IR EMBO ART  VEN HEMORR LYMPH EXTRAV  INC GUIDE ROADMAPPING  08/01/2018   IR IMAGING GUIDED PORT INSERTION  08/20/2018   IR PERC PLEURAL DRAIN W/INDWELL CATH W/IMG GUIDE  07/08/2018   IR THORACENTESIS ASP PLEURAL SPACE W/IMG GUIDE  07/07/2018   IR US GUIDE VASC ACCESS RIGHT  08/01/2018   PLEURAL EFFUSION DRAINAGE Left 07/13/2018   Procedure: DRAINAGE OF LOCULATED PLEURAL EFFUSION;  Surgeon: Ivin Poot, MD;  Location: [facility];  Service: Thoracic;  Laterality: Left;   POLYPECTOMY  11/15/2020   Procedure: POLYPECTOMY;  Surgeon: Rogene Houston, MD;   [facility];  Service: Endoscopy;;   REMOVAL OF PLEURAL DRAINAGE CATHETER Left 08/20/2018   Procedure: REMOVAL OF PLEURAL DRAINAGE CATHETER;  Surgeon: Ivin Poot, MD;  Location: [facility];  Service: Thoracic;  Laterality: Left;   REMOVAL OF PLEURAL DRAINAGE CATHETER Left 08/20/2018   Procedure: REMOVAL OF PLEURAL DRAINAGE CATHETER;  Surgeon: Ivin Poot, MD;  Location: [facility];  Service: Thoracic;  Laterality: Left;   TALC PLEURODESIS Left 07/13/2018   Procedure: Talc Pleuradesis;  Surgeon: Prescott Gum, Collier Salina, MD;  Location: [facility];  Service: Thoracic;  Laterality: Left;   TOTAL HIP ARTHROPLASTY Left 06/05/2020   Procedure: LEFT TOTAL HIP ARTHROPLASTY ANTERIOR APPROACH;  Surgeon: Leandrew Koyanagi, MD;  Location: [facility];  Service: Orthopedics;  Laterality: Left;  3-C   TUBAL LIGATION Bilateral    UTERINE ABLATION     VIDEO ASSISTED THORACOSCOPY Left 07/13/2018   Procedure: VIDEO ASSISTED THORACOSCOPY;  Surgeon: Ivin Poot, MD;  Location: [facility];  Service: Thoracic;  Laterality: Left;    SOCIAL HISTORY:  Social History   Socioeconomic History   Marital status: Widowed    Spouse name:  Not on file   Number of children: 2   Years of education: 2-College   Highest education level: Not on file  Occupational History    Employer: [employer]  Tobacco Use   Smoking status: Former    Packs/day: 0.50    Years: 17.00    Pack years: 8.50    Types: Cigarettes    Quit date: 06/22/2018    Years since quitting: 2.4   Smokeless tobacco: Never  Vaping Use   Vaping Use: Never used  Substance and Sexual Activity   Alcohol use: Yes    Alcohol/week: 1.0 standard drink    Types: 1 Glasses of wine per week    Comment: Drinks alcohol on occasion   Drug use: No   Sexual activity: Not on file  Other Topics Concern   Not on file  Social History Narrative   Patient lives at home with her daughter.    Patient has 2 children.    Patient is widowed.    Patient is right handed.    Patient has her  Associates degree.      Social Determinants of Health   Financial Resource Strain: Not on file  Food Insecurity: Not on file  Transportation Needs: Not on file  Physical Activity: Not on file  Stress: Not on file  Social Connections: Not on file  Intimate Partner Violence: Not on file    FAMILY HISTORY:  Family History  Problem Relation Age of Onset   Hypertension Mother    Obesity Mother    Diabetes Mother    Kidney disease Mother    Peripheral vascular disease Father    Atrial fibrillation Father    COPD Brother    Osteoporosis Brother    Crohn's disease Sister    Uterine cancer Sister 19       maternal half sister   Breast cancer Maternal Aunt 50   Colon cancer Neg Hx     CURRENT MEDICATIONS:  Current Outpatient Medications  Medication Sig Dispense Refill   albuterol (VENTOLIN HFA) 108 (90 Base) MCG/ACT inhaler Inhale 2 puffs into the lungs every 6 (six) hours as needed for wheezing or shortness of breath.     Ascorbic Acid (VITAMIN C) 1000 MG tablet Take 2,000 mg by mouth daily.     B Complex-C (B-COMPLEX WITH VITAMIN C) tablet Take 1 tablet by mouth daily.     Cholecalciferol (VITAMIN D) 50 MCG (2000 UT) CAPS Take 2,000 Units by mouth daily.     diazepam (VALIUM) 5 MG tablet TAKE ONE TABLET BY MOUTH AT BEDTIME (Patient taking differently: Take 5 mg by mouth at bedtime.) 30 tablet 5   docusate sodium (COLACE) 100 MG capsule Take 1 capsule (100 mg total) by mouth daily as needed. To be taken after surgery 30 capsule 2   gabapentin (NEURONTIN) 100 MG capsule TAKE TWO (2) CAPSULES BY MOUTH AT BEDTIME 60 capsule 2   lidocaine-prilocaine (EMLA) cream Apply small amount to port a cath site and cover with plastic wrap one hour prior to chemotherapy appointments 30 g 3   Magnesium 500 MG TABS Take 500 mg by mouth in the morning.     omeprazole (PRILOSEC) 40 MG capsule Take 1 capsule (40 mg total) by mouth 2 (two) times daily. 180 capsule 3   ondansetron (ZOFRAN) 4 MG tablet  Take 1 tablet (4 mg total) by mouth every 8 (eight) hours as needed for nausea or vomiting. 40 tablet 0   Oxycodone HCl 10 MG TABS  Take 1 tablet (10 mg total) by mouth every 4 (four) hours as needed. 168 tablet 0   sennosides-docusate sodium (SENOKOT-S) 8.6-50 MG tablet Take 1 tablet by mouth daily as needed for constipation.     SUMAtriptan (IMITREX) 100 MG tablet TAKE ONE TABLET BY MOUTH PRN UP to TWICE DAILY AS NEEDED. 9 tablet 5   tiZANidine (ZANAFLEX) 4 MG tablet Take 1 tablet (4 mg total) by mouth 3 (three) times daily as needed for muscle spasms. (Patient taking differently: Take 4 mg by mouth at bedtime.) 60 tablet 0   triamcinolone cream (KENALOG) 0.1 % Apply 1 application topically 2 (two) times daily as needed (rash). 80 g 11   venlafaxine XR (EFFEXOR-XR) 150 MG 24 hr capsule TAKE ONE CAPSULE BY MOUTH DAILY WITH BREAKFAST (Patient taking differently: Take 150 mg by mouth at bedtime.) 30 capsule 6   No current facility-administered medications for this visit.   Facility-Administered Medications Ordered in Other Visits  Medication Dose Route Frequency Provider Last Rate Last Admin   0.9 %  sodium chloride infusion   Intravenous Continuous Derek Jack, MD   Stopped at 10/23/20 1547   sodium chloride flush (NS) 0.9 % injection 10 mL  10 mL Intravenous PRN Derek Jack, MD   10 mL at 10/23/20 1544    ALLERGIES:  Allergies  Allergen Reactions   Morphine And Related Itching   Nickel Itching   Nortriptyline Other (See Comments)    Significant weight gain   Topamax [Topiramate] Diarrhea and Nausea Only   Xanax [Alprazolam]     Can't wake up    Actifed Cold-Allergy [Chlorpheniramine-Phenyleph Er] Rash   Amoxicillin Rash    Did it involve swelling of the face/tongue/throat, SOB, or low BP? Unknown Did it involve sudden or severe rash/hives, skin peeling, or any reaction on the inside of your mouth or nose? Unknown Did you need to seek medical attention at a hospital or  doctor's office? Unknown When did it last happen? teenager       If all above answers are "NO", may proceed with cephalosporin use.    Codeine Hives   Erythromycin Rash   Penicillins Rash    Did it involve swelling of the face/tongue/throat, SOB, or low BP? Unknown Did it involve sudden or severe rash/hives, skin peeling, or any reaction on the inside of your mouth or nose? Unknown Did you need to seek medical attention at a hospital or doctor's office? Unknown When did it last happen? teenager       If all above answers are "NO", may proceed with cephalosporin use.    Sudafed [Pseudoephedrine Hcl] Rash    PHYSICAL EXAM:  Performance status (ECOG): 1 - Symptomatic but completely ambulatory  There were no vitals filed for this visit. Wt Readings from Last 3 Encounters:  11/13/20 181 lb 1.6 oz (82.1 kg)  11/07/20 181 lb 1.6 oz (82.1 kg)  10/24/20 (P) 182 lb 11.2 oz (82.9 kg)   Physical Exam Vitals reviewed.  Constitutional:      Appearance: Normal appearance.  Cardiovascular:     Rate and Rhythm: Normal rate and regular rhythm.     Pulses: Normal pulses.     Heart sounds: Normal heart sounds.  Pulmonary:     Effort: Pulmonary effort is normal.     Breath sounds: Normal breath sounds.  Abdominal:     Palpations: Abdomen is soft. There is no hepatomegaly, splenomegaly or mass.     Tenderness: There is abdominal tenderness in the  left upper quadrant.  Neurological:     General: No focal deficit present.     Mental Status: She is alert and oriented to person, place, and time.  Psychiatric:        Mood and Affect: Mood normal.        Behavior: Behavior normal.     LABORATORY DATA:  I have reviewed the labs as listed.  CBC Latest Ref Rng & Units 11/16/2020 11/07/2020 10/11/2020  WBC 4.0 - 10.5 K/uL 7.2 7.5 8.0  Hemoglobin 12.0 - 15.0 g/dL 12.3 12.9 9.5(L)  Hematocrit 36.0 - 46.0 % 37.9 39.0 31.7(L)  Platelets 150 - 400 K/uL 244 444(H) 466(H)   CMP Latest Ref Rng & Units  11/16/2020 10/05/2020 07/07/2020  Glucose 70 - 99 mg/dL 138(H) 99 91  BUN 6 - 20 mg/dL $Remove'8 13 12  'XCILutk$ Creatinine 0.44 - 1.00 mg/dL 0.70 0.55 0.61  Sodium 135 - 145 mmol/L 141 138 139  Potassium 3.5 - 5.1 mmol/L 3.7 4.2 3.7  Chloride 98 - 111 mmol/L 107 105 103  CO2 22 - 32 mmol/L $RemoveB'27 27 28  'KXlxVISj$ Calcium 8.9 - 10.3 mg/dL 8.9 8.8(L) 9.4  Total Protein 6.5 - 8.1 g/dL 6.1(L) 6.7 6.9  Total Bilirubin 0.3 - 1.2 mg/dL 0.5 0.2(L) 0.5  Alkaline Phos 38 - 126 U/L 102 88 130(H)  AST 15 - 41 U/L 21 13(L) 28  ALT 0 - 44 U/L $Remo'16 10 20    'uBwSt$ DIAGNOSTIC IMAGING:  I have independently reviewed the scans and discussed with the patient. No results found.   ASSESSMENT:  1.  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 -Robotic assisted laparoscopic TAH and BSO and omentectomy on 12/24/2018, pathology showing high-grade serous carcinoma, PT3P NX. -Germline mutation testing was negative. -3 more cycles of adjuvant chemotherapy completed on 03/15/2019. -CTAP on 04/13/2019 showed no findings of active malignancy.  28% reduction in the volume of presumed chronic hematoma/chronic fluid collection splaying the upper margin of the spleen.  Large type III hiatal hernia. -CTAP on 10/13/2019 shows no findings of recurrence or metastatic disease.   PLAN:  1.  Clinical stage IVa high-grade serous ovarian cancer, positive cytology of left pleural effusion: - She does not report any major abdominal pains.  However she has occasional cramping in the lower quadrants. - Reviewed labs from 11/16/2020.  CA125 has increased to 59.2. - Other labs indicate normal LFTs. - Recommend CT CAP with contrast and follow-up in 4 weeks.   2.  Generalized pain/left upper quadrant pain: - She has left upper quadrant pain for which she is taking oxycodone 10 mg up to 4 tablets daily.   3.  Peripheral neuropathy: - Continue  gabapentin at bedtime for numbness in the hands and feet.   4.  Hot flashes: - Continue Effexor 150 mg daily.   5.  Severe normocytic anemia: - She had last iron infusion on 10/23/2020. - Hemoglobin improved to 12.3. - She had EGD and colonoscopy done which did not show any major sources of bleeding. - She is reportedly planning to have capsule study done. - Recommend checking ferritin and iron panel in 4 weeks.   Orders placed this encounter:  No orders of the defined types were placed in this encounter.    Derek Jack, MD [place] [phone]   I, Thana Ates, am acting as a scribe for Dr. Derek Jack.  Kinnie Scales  MD, have reviewed the above documentation for accuracy and completeness, and I agree with the above.

## 2020-11-21 ENCOUNTER — Inpatient Hospital Stay (HOSPITAL_BASED_OUTPATIENT_CLINIC_OR_DEPARTMENT_OTHER): Payer: Medicare Other | Admitting: Hematology

## 2020-11-21 ENCOUNTER — Other Ambulatory Visit: Payer: Self-pay

## 2020-11-21 VITALS — BP 121/72 | HR 86 | Temp 98.3°F | Resp 18 | Wt 184.3 lb

## 2020-11-21 DIAGNOSIS — C563 Malignant neoplasm of bilateral ovaries: Secondary | ICD-10-CM

## 2020-11-21 DIAGNOSIS — Z452 Encounter for adjustment and management of vascular access device: Secondary | ICD-10-CM | POA: Diagnosis not present

## 2020-11-21 NOTE — Patient Instructions (Signed)
[place] at [facility] Discharge Instructions  You were seen and examined today by Dr. Delton Coombes. He is going to get you scheduled for CT Chest abdomen and pelvis. Please follow up as scheduled.   Thank you for choosing [place] at [facility] to provide your oncology and hematology care.  To afford each patient quality time with our provider, please arrive at least 15 minutes before your scheduled appointment time.   If you have a lab appointment with the [facility] please come in thru the Main Entrance and check in at the main information desk.  You need to re-schedule your appointment should you arrive 10 or more minutes late.  We strive to give you quality time with our providers, and arriving late affects you and other patients whose appointments are after yours.  Also, if you no show three or more times for appointments you may be dismissed from the clinic at the providers discretion.     Again, thank you for choosing [facility].  Our hope is that these requests will decrease the amount of time that you wait before being seen by our physicians.       _____________________________________________________________  Should you have questions after your visit to [facility], please contact our office at [phone] and follow the prompts.  Our office hours are 8:00 a.m. and 4:30 p.m. Monday - Friday.  Please note that voicemails left after 4:00 p.m. may not be returned until the following business day.  We are closed weekends and major holidays.  You do have access to a nurse 24-7, just call the main number to the clinic [phone] and do not press any options, hold on the line and a nurse will answer the phone.    For prescription refill requests, have your pharmacy contact our office and allow 72 hours.    Due to Covid, you will need to wear a mask upon entering the hospital. If you do not have a mask, a mask  will be given to you at the Main Entrance upon arrival. For doctor visits, patients may have 1 support person age 81 or older with them. For treatment visits, patients can not have anyone with them due to social distancing guidelines and our immunocompromised population.

## 2020-11-23 ENCOUNTER — Encounter (HOSPITAL_COMMUNITY): Payer: Self-pay

## 2020-11-24 ENCOUNTER — Encounter (INDEPENDENT_AMBULATORY_CARE_PROVIDER_SITE_OTHER): Payer: Self-pay | Admitting: *Deleted

## 2020-11-24 ENCOUNTER — Encounter (HOSPITAL_COMMUNITY)
Admission: RE | Disposition: A | Discharge status: Home / Self Care | Payer: Self-pay | Source: Ambulatory Visit | Attending: Internal Medicine

## 2020-11-24 ENCOUNTER — Ambulatory Visit (HOSPITAL_COMMUNITY)
Admission: RE | Admit: 2020-11-24 | Discharge: 2020-11-24 | Disposition: A | Discharge status: Home / Self Care | Payer: Medicare Other | Source: Ambulatory Visit | Attending: Internal Medicine | Admitting: Internal Medicine

## 2020-11-24 ENCOUNTER — Encounter (HOSPITAL_COMMUNITY): Payer: Self-pay | Admitting: Internal Medicine

## 2020-11-24 DIAGNOSIS — K449 Diaphragmatic hernia without obstruction or gangrene: Secondary | ICD-10-CM | POA: Diagnosis not present

## 2020-11-24 DIAGNOSIS — Q2739 Arteriovenous malformation, other site: Secondary | ICD-10-CM | POA: Insufficient documentation

## 2020-11-24 DIAGNOSIS — D500 Iron deficiency anemia secondary to blood loss (chronic): Secondary | ICD-10-CM | POA: Insufficient documentation

## 2020-11-24 HISTORY — PX: GIVENS CAPSULE STUDY: SHX5432

## 2020-11-24 SURGERY — IMAGING PROCEDURE, GI TRACT, INTRALUMINAL, VIA CAPSULE

## 2020-11-24 NOTE — H&P (Signed)
Michelle Holt is an 50 y.o. female.   Chief Complaint: Patient is here for small bowel given capsule study. HPI: Patient is 50 year old Caucasian female who is found to have iron deficiency anemia by Dr. Delton Coombes patient's oncologist.  She underwent esophagogastroduodenoscopy over 2 weeks ago followed by colonoscopy last week.  She was noted to have moderate size hiatal hernia with few Lysbeth Galas lesions but no bleeding was identified.  We therefore recommended small bowel given capsule study to complete her work-up.  Past Medical History:  Diagnosis Date   Anemia    Anxiety and depression    Arthritis of facet joints at multiple vertebral levels    L5-S1   Constipation    Dyslipidemia    Family history of breast cancer    Family history of uterine cancer    GERD (gastroesophageal reflux disease)    History of hiatal hernia    History of kidney stones    Insomnia    Irritable bowel syndrome    Migraine    Muscle tension headache    Neuropathy of finger    Ovarian carcinoma (HCC)    ovarian   Plantar fasciitis of right foot    Port-A-Cath in place 08/20/2018    Past Surgical History:  Procedure Laterality Date   ABDOMINAL HYSTERECTOMY     BIOPSY  10/27/2020   Procedure: BIOPSY;  Surgeon: Harvel Quale, MD;  [facility];  Service: Gastroenterology;;   CHOLECYSTECTOMY  2008   COLONOSCOPY N/A 08/13/2013   Procedure: COLONOSCOPY;  Surgeon: Rogene Houston, MD;  [facility];  Service: Endoscopy;  Laterality: N/A;  230-moved to 145 Ann to notify pt   COLONOSCOPY WITH PROPOFOL N/A 11/15/2020   Procedure: COLONOSCOPY WITH PROPOFOL;  Surgeon: Rogene Houston, MD;  [facility];  Service: Endoscopy;  Laterality: N/A;  1:40   ESOPHAGOGASTRODUODENOSCOPY     ESOPHAGOGASTRODUODENOSCOPY (EGD) WITH PROPOFOL N/A 07/20/2018   Procedure: ESOPHAGOGASTRODUODENOSCOPY (EGD) WITH PROPOFOL;  Surgeon: Rogene Houston, MD;  [facility];  Service:  Endoscopy;  Laterality: N/A;  Possible esophageal dilation.   ESOPHAGOGASTRODUODENOSCOPY (EGD) WITH PROPOFOL N/A 10/27/2020   Procedure: ESOPHAGOGASTRODUODENOSCOPY (EGD) WITH PROPOFOL;  Surgeon: Harvel Quale, MD;  [facility];  Service: Gastroenterology;  Laterality: N/A;  2:10, pt knows to arrive at 10:15   IR [place]  08/01/2018   IR ANGIOGRAM VISCERAL SELECTIVE  08/01/2018   IR EMBO ART  VEN HEMORR LYMPH EXTRAV  INC GUIDE ROADMAPPING  08/01/2018   IR IMAGING GUIDED PORT INSERTION  08/20/2018   IR PERC PLEURAL DRAIN W/INDWELL CATH W/IMG GUIDE  07/08/2018   IR THORACENTESIS ASP PLEURAL SPACE W/IMG GUIDE  07/07/2018   IR US GUIDE VASC ACCESS RIGHT  08/01/2018   PLEURAL EFFUSION DRAINAGE Left 07/13/2018   Procedure: DRAINAGE OF LOCULATED PLEURAL EFFUSION;  Surgeon: Ivin Poot, MD;  Location: [place];  Service: Thoracic;  Laterality: Left;   POLYPECTOMY  11/15/2020   Procedure: POLYPECTOMY;  Surgeon: Rogene Houston, MD;  [facility];  Service: Endoscopy;;   REMOVAL OF PLEURAL DRAINAGE CATHETER Left 08/20/2018   Procedure: REMOVAL OF PLEURAL DRAINAGE CATHETER;  Surgeon: Ivin Poot, MD;  Location: [facility];  Service: Thoracic;  Laterality: Left;   REMOVAL OF PLEURAL DRAINAGE CATHETER Left 08/20/2018   Procedure: REMOVAL OF PLEURAL DRAINAGE CATHETER;  Surgeon: Ivin Poot, MD;  Location: [place];  Service: Thoracic;  Laterality: Left;  TALC PLEURODESIS Left 07/13/2018   Procedure: Talc Pleuradesis;  Surgeon: Prescott Gum, Collier Salina, MD;  Location: [place];  Service: Thoracic;  Laterality: Left;   TOTAL HIP ARTHROPLASTY Left 06/05/2020   Procedure: LEFT TOTAL HIP ARTHROPLASTY ANTERIOR APPROACH;  Surgeon: Leandrew Koyanagi, MD;  Location: [place];  Service: Orthopedics;  Laterality: Left;  3-C   TUBAL LIGATION Bilateral    UTERINE ABLATION     VIDEO ASSISTED THORACOSCOPY Left 07/13/2018   Procedure: VIDEO ASSISTED THORACOSCOPY;  Surgeon: Prescott Gum, Collier Salina, MD;  Location: [facility];  Service: Thoracic;  Laterality: Left;    Family History  Problem Relation Age of Onset   Hypertension Mother    Obesity Mother    Diabetes Mother    Kidney disease Mother    Peripheral vascular disease Father    Atrial fibrillation Father    COPD Brother    Osteoporosis Brother    Crohn's disease Sister    Uterine cancer Sister 32       maternal half sister   Breast cancer Maternal Aunt 86   Colon cancer Neg Hx    Social History:  reports that she quit smoking about 2 years ago. Her smoking use included cigarettes. She has a 8.50 pack-year smoking history. She has never used smokeless tobacco. She reports current alcohol use of about 1.0 standard drink per week. She reports that she does not use drugs.  Allergies:  Allergies  Allergen Reactions   Morphine And Related Itching   Nickel Itching   Nortriptyline Other (See Comments)    Significant weight gain   Topamax [Topiramate] Diarrhea and Nausea Only   Xanax [Alprazolam]     Can't wake up    Actifed Cold-Allergy [Chlorpheniramine-Phenyleph Er] Rash   Amoxicillin Rash    Did it involve swelling of the face/tongue/throat, SOB, or low BP? Unknown Did it involve sudden or severe rash/hives, skin peeling, or any reaction on the inside of your mouth or nose? Unknown Did you need to seek medical attention at a hospital or doctor's office? Unknown When did it last happen? teenager       If all above answers are "NO", may proceed with cephalosporin use.    Codeine Hives   Erythromycin Rash   Penicillins Rash    Did it involve swelling of the face/tongue/throat, SOB, or low BP? Unknown Did it involve sudden or severe rash/hives, skin peeling, or any reaction on the inside of your mouth or nose? Unknown Did you need to seek medical attention at a hospital or doctor's office? Unknown When did it last happen? teenager       If all above answers are "NO", may proceed with cephalosporin use.     Sudafed [Pseudoephedrine Hcl] Rash    Medications Prior to Admission  Medication Sig Dispense Refill   albuterol (VENTOLIN HFA) 108 (90 Base) MCG/ACT inhaler Inhale 2 puffs into the lungs every 6 (six) hours as needed for wheezing or shortness of breath. (Patient not taking: Reported on 11/21/2020)     Ascorbic Acid (VITAMIN C) 1000 MG tablet Take 2,000 mg by mouth daily.     B Complex-C (B-COMPLEX WITH VITAMIN C) tablet Take 1 tablet by mouth daily.     Cholecalciferol (VITAMIN D) 50 MCG (2000 UT) CAPS Take 2,000 Units by mouth daily.     diazepam (VALIUM) 5 MG tablet TAKE ONE TABLET BY MOUTH AT BEDTIME (Patient taking differently: Take 5 mg by mouth at bedtime.) 30 tablet 5   docusate  sodium (COLACE) 100 MG capsule Take 1 capsule (100 mg total) by mouth daily as needed. To be taken after surgery (Patient not taking: Reported on 11/21/2020) 30 capsule 2   gabapentin (NEURONTIN) 100 MG capsule TAKE TWO (2) CAPSULES BY MOUTH AT BEDTIME 60 capsule 2   lidocaine-prilocaine (EMLA) cream Apply small amount to port a cath site and cover with plastic wrap one hour prior to chemotherapy appointments 30 g 3   Magnesium 500 MG TABS Take 500 mg by mouth in the morning.     omeprazole (PRILOSEC) 40 MG capsule Take 1 capsule (40 mg total) by mouth 2 (two) times daily. 180 capsule 3   ondansetron (ZOFRAN) 4 MG tablet Take 1 tablet (4 mg total) by mouth every 8 (eight) hours as needed for nausea or vomiting. (Patient not taking: Reported on 11/21/2020) 40 tablet 0   Oxycodone HCl 10 MG TABS Take 1 tablet (10 mg total) by mouth every 4 (four) hours as needed. (Patient not taking: Reported on 11/21/2020) 168 tablet 0   sennosides-docusate sodium (SENOKOT-S) 8.6-50 MG tablet Take 1 tablet by mouth daily as needed for constipation.     SUMAtriptan (IMITREX) 100 MG tablet TAKE ONE TABLET BY MOUTH PRN UP to TWICE DAILY AS NEEDED. (Patient not taking: Reported on 11/21/2020) 9 tablet 5   tiZANidine (ZANAFLEX) 4 MG tablet  Take 1 tablet (4 mg total) by mouth 3 (three) times daily as needed for muscle spasms. (Patient not taking: Reported on 11/21/2020) 60 tablet 0   triamcinolone cream (KENALOG) 0.1 % Apply 1 application topically 2 (two) times daily as needed (rash). (Patient not taking: Reported on 11/21/2020) 80 g 11   venlafaxine XR (EFFEXOR-XR) 150 MG 24 hr capsule TAKE ONE CAPSULE BY MOUTH DAILY WITH BREAKFAST (Patient taking differently: Take 150 mg by mouth at bedtime.) 30 capsule 6    No results found for this or any previous visit (from the past 48 hour(s)). No results found.  Review of Systems  There were no vitals taken for this visit. Physical Exam   Assessment/Plan  Iron deficiency anemia secondary to GI bleed. No source identified on esophagogastroduodenoscopy and colonoscopy. Small bowel given study to evaluate small bowel.  Hildred Laser, MD 11/24/2020, 5:45 PM

## 2020-11-25 ENCOUNTER — Other Ambulatory Visit (HOSPITAL_COMMUNITY): Payer: Self-pay | Admitting: Hematology

## 2020-11-27 ENCOUNTER — Telehealth (INDEPENDENT_AMBULATORY_CARE_PROVIDER_SITE_OTHER): Payer: Self-pay | Admitting: *Deleted

## 2020-11-27 ENCOUNTER — Encounter: Payer: Self-pay | Admitting: Orthopaedic Surgery

## 2020-11-27 ENCOUNTER — Encounter (HOSPITAL_COMMUNITY): Payer: Self-pay | Admitting: Hematology

## 2020-11-27 NOTE — Telephone Encounter (Signed)
Asking for results of given's capsule study. Done on 11/24/20.

## 2020-11-28 ENCOUNTER — Telehealth (INDEPENDENT_AMBULATORY_CARE_PROVIDER_SITE_OTHER): Payer: Self-pay | Admitting: Internal Medicine

## 2020-11-28 NOTE — Telephone Encounter (Signed)
Per dr Laural Golden - call pt and let her know he will call this evening. Pt notified.

## 2020-11-28 NOTE — Telephone Encounter (Signed)
Results were given capsule study reviewed with patient.  Few punctate AV malformations in duodenum without stigmata of bleeding.  No other abnormalities noted in rest of the small bowel. Patient says she had melanic stool yesterday.  She feels her hemoglobin has dropped.  She had another day where she passed burgundy blood and some clots.  Patient will go to the lab for H&H tomorrow We will also give her 3 Hemoccult cards.  She will try to do 1 tomorrow and others when she has tarry stool.

## 2020-11-29 ENCOUNTER — Encounter (HOSPITAL_COMMUNITY): Payer: Self-pay | Admitting: Internal Medicine

## 2020-11-29 ENCOUNTER — Other Ambulatory Visit (INDEPENDENT_AMBULATORY_CARE_PROVIDER_SITE_OTHER): Payer: Self-pay | Admitting: *Deleted

## 2020-11-29 DIAGNOSIS — D649 Anemia, unspecified: Secondary | ICD-10-CM

## 2020-11-29 NOTE — Telephone Encounter (Signed)
Bw orders and hemoccult x 3 placed up front for pickup.

## 2020-11-30 DIAGNOSIS — Z9889 Other specified postprocedural states: Secondary | ICD-10-CM | POA: Diagnosis not present

## 2020-11-30 DIAGNOSIS — D500 Iron deficiency anemia secondary to blood loss (chronic): Secondary | ICD-10-CM | POA: Diagnosis not present

## 2020-11-30 DIAGNOSIS — K31819 Angiodysplasia of stomach and duodenum without bleeding: Secondary | ICD-10-CM | POA: Diagnosis not present

## 2020-11-30 LAB — HEMOGLOBIN AND HEMATOCRIT, BLOOD
HCT: 38.7 % (ref 35.0–45.0)
Hemoglobin: 13 g/dL (ref 11.7–15.5)

## 2020-11-30 NOTE — Op Note (Signed)
Small Bowel [place]ens Capsule Study Procedure date: 11/24/2020   Referring Provider: Dr. Delton Coombes PCP:  Dr. [name], MD  Indication for procedure:    Patient is 50 year old Caucasian female with history of iron deficiency anemia secondary to GI bleed.  She underwent esophagogastroduodenoscopy followed by colonoscopy and no bleeding site was identified.  She is therefore undergoing small bowel given capsule study looking for source of bleeding and also to complete her work-up.    Findings:    Patient was able to swallow given capsule without any difficulty. Study is complete as capsule reached colon. Small punctate AV malformation noted in duodenum best seen on image at 00:14:46 without stigmata of bleeding. Rest of the examination was normal.  First Gastric image: 29 sec First Duodenal image: 13 min and 48 sec First Ileo-Cecal Valve image: 2 hrs and 43 min First Cecal image: 2 hrs 43 min and 26 sec Gastric Passage time: 13 min and 19 sec Small Bowel Passage time: 2 hours and 29 minutes  Summary & Recommendations:  Punctate AV malformation noted involving duodenal mucosa without stigmata of bleeding. No other abnormality noted. Findings reviewed with patient over the phone. She reports having another episode of melena. Will check H&H and Hemoccults. Further recommendations to follow.

## 2020-12-05 ENCOUNTER — Telehealth (INDEPENDENT_AMBULATORY_CARE_PROVIDER_SITE_OTHER): Payer: Self-pay

## 2020-12-05 NOTE — Telephone Encounter (Signed)
   Diagnosis: Iron Def Anemia D 50.9.   Result(s)   12/01/20 - Card 1: Negative:     12/02/20 - Card 2: Negative:   12/03/20 - Card 3: Negative:    Completed by: Donette Larry, CMA   HEMOCCULT SENSA DEVELOPER:  ORJ#:[id number] EXPIRATION DATE: 06/2022   HEMOCCULT SENSA CARD: LOT#: [id number] EXPIRATION DATE: 02/2022   CARD CONTROL RESULTS: POSITIVE: + NEGATIVE:-    ADDITIONAL COMMENTS:  Patient made aware all cards being negative.

## 2020-12-06 ENCOUNTER — Ambulatory Visit (HOSPITAL_COMMUNITY)
Admission: RE | Admit: 2020-12-06 | Discharge: 2020-12-06 | Disposition: A | Discharge status: Home / Self Care | Payer: Medicare Other | Source: Ambulatory Visit | Attending: Hematology | Admitting: Hematology

## 2020-12-06 ENCOUNTER — Other Ambulatory Visit: Payer: Self-pay

## 2020-12-06 DIAGNOSIS — C563 Malignant neoplasm of bilateral ovaries: Secondary | ICD-10-CM | POA: Diagnosis present

## 2020-12-06 MED ORDER — HEPARIN SOD (PORK) LOCK FLUSH 100 UNIT/ML IV SOLN
500.0000 [IU] | Freq: Once | INTRAVENOUS | Status: AC
  Administered 2020-12-06: 500 [IU] via INTRAVENOUS

## 2020-12-06 MED ORDER — HEPARIN SOD (PORK) LOCK FLUSH 100 UNIT/ML IV SOLN
INTRAVENOUS | Status: AC
  Filled 2020-12-06: qty 5

## 2020-12-06 MED ORDER — SODIUM CHLORIDE (PF) 0.9 % IJ SOLN
INTRAMUSCULAR | Status: AC
  Filled 2020-12-06: qty 50

## 2020-12-06 MED ORDER — IOHEXOL 350 MG/ML SOLN
80.0000 mL | Freq: Once | INTRAVENOUS | Status: AC | PRN
  Administered 2020-12-06: 80 mL via INTRAVENOUS

## 2020-12-11 ENCOUNTER — Other Ambulatory Visit (HOSPITAL_COMMUNITY): Payer: Self-pay

## 2020-12-11 ENCOUNTER — Inpatient Hospital Stay (HOSPITAL_COMMUNITY): Payer: Medicare Other

## 2020-12-11 DIAGNOSIS — C563 Malignant neoplasm of bilateral ovaries: Secondary | ICD-10-CM

## 2020-12-11 MED ORDER — OXYCODONE HCL 10 MG PO TABS: 10.0000 mg | ORAL_TABLET | ORAL | 0 refills | Status: DC | PRN

## 2020-12-11 NOTE — Telephone Encounter (Signed)
3 out of 3 Hemoccults are negative. Patient is aware of the results. She is to have blood work by Dr. Delton Coombes tomorrow. She will get Hemoccults from his office in case stools turned dark or black again.

## 2020-12-12 ENCOUNTER — Other Ambulatory Visit: Payer: Self-pay

## 2020-12-12 ENCOUNTER — Encounter (HOSPITAL_COMMUNITY): Payer: Self-pay

## 2020-12-12 ENCOUNTER — Inpatient Hospital Stay (HOSPITAL_COMMUNITY): Payer: Medicare Other

## 2020-12-12 DIAGNOSIS — Z452 Encounter for adjustment and management of vascular access device: Secondary | ICD-10-CM | POA: Diagnosis not present

## 2020-12-12 DIAGNOSIS — C563 Malignant neoplasm of bilateral ovaries: Secondary | ICD-10-CM

## 2020-12-12 LAB — CBC WITH DIFFERENTIAL/PLATELET
Abs Immature Granulocytes: 0.01 10*3/uL (ref 0.00–0.07)
Basophils Absolute: 0 10*3/uL (ref 0.0–0.1)
Basophils Relative: 1 %
Eosinophils Absolute: 0.1 10*3/uL (ref 0.0–0.5)
Eosinophils Relative: 2 %
HCT: 41.4 % (ref 36.0–46.0)
Hemoglobin: 13.5 g/dL (ref 12.0–15.0)
Immature Granulocytes: 0 %
Lymphocytes Relative: 38 %
Lymphs Abs: 2.4 10*3/uL (ref 0.7–4.0)
MCH: 34.4 pg — ABNORMAL HIGH (ref 26.0–34.0)
MCHC: 32.6 g/dL (ref 30.0–36.0)
MCV: 105.3 fL — ABNORMAL HIGH (ref 80.0–100.0)
Monocytes Absolute: 0.6 10*3/uL (ref 0.1–1.0)
Monocytes Relative: 10 %
Neutro Abs: 3.2 10*3/uL (ref 1.7–7.7)
Neutrophils Relative %: 49 %
Platelets: 327 10*3/uL (ref 150–400)
RBC: 3.93 MIL/uL (ref 3.87–5.11)
RDW: 16.9 % — ABNORMAL HIGH (ref 11.5–15.5)
WBC: 6.3 10*3/uL (ref 4.0–10.5)
nRBC: 0 % (ref 0.0–0.2)

## 2020-12-12 LAB — IRON AND TIBC
Iron: 31 ug/dL (ref 28–170)
Saturation Ratios: 9 % — ABNORMAL LOW (ref 10.4–31.8)
TIBC: 338 ug/dL (ref 250–450)
UIBC: 307 ug/dL

## 2020-12-12 LAB — COMPREHENSIVE METABOLIC PANEL
ALT: 28 U/L (ref 0–44)
AST: 34 U/L (ref 15–41)
Albumin: 3.8 g/dL (ref 3.5–5.0)
Alkaline Phosphatase: 116 U/L (ref 38–126)
Anion gap: 9 (ref 5–15)
BUN: 10 mg/dL (ref 6–20)
CO2: 26 mmol/L (ref 22–32)
Calcium: 8.9 mg/dL (ref 8.9–10.3)
Chloride: 102 mmol/L (ref 98–111)
Creatinine, Ser: 0.68 mg/dL (ref 0.44–1.00)
GFR, Estimated: >60 mL/min (ref >60)
Glucose, Bld: 115 mg/dL — ABNORMAL HIGH (ref 70–99)
Potassium: 3.4 mmol/L — ABNORMAL LOW (ref 3.5–5.1)
Sodium: 137 mmol/L (ref 135–145)
Total Bilirubin: 0.5 mg/dL (ref 0.3–1.2)
Total Protein: 7.3 g/dL (ref 6.5–8.1)

## 2020-12-12 LAB — FERRITIN: Ferritin: 68 ng/mL (ref 11–307)

## 2020-12-12 LAB — MAGNESIUM: Magnesium: 2 mg/dL (ref 1.7–2.4)

## 2020-12-12 MED ORDER — SODIUM CHLORIDE 0.9% FLUSH
10.0000 mL | INTRAVENOUS | Status: DC | PRN
  Administered 2020-12-12: 10 mL via INTRAVENOUS

## 2020-12-12 MED ORDER — HEPARIN SOD (PORK) LOCK FLUSH 100 UNIT/ML IV SOLN
500.0000 [IU] | Freq: Once | INTRAVENOUS | Status: AC
  Administered 2020-12-12: 500 [IU] via INTRAVENOUS

## 2020-12-12 NOTE — Patient Instructions (Signed)
[facility]  Discharge Instructions: Thank you for choosing [facility] to provide your oncology and hematology care.  If you have a lab appointment with the Cancer Center, please come in thru the Main Entrance and check in at the main information desk.  Wear comfortable clothing and clothing appropriate for easy access to any Portacath or PICC line.   We strive to give you quality time with your provider. You may need to reschedule your appointment if you arrive late (15 or more minutes).  Arriving late affects you and other patients whose appointments are after yours.  Also, if you miss three or more appointments without notifying the office, you may be dismissed from the clinic at the provider's discretion.      For prescription refill requests, have your pharmacy contact our office and allow 72 hours for refills to be completed.    Today you received the following chemotherapy and/or immunotherapy agents port flush with labs today      To help prevent nausea and vomiting after your treatment, we encourage you to take your nausea medication as directed.  BELOW ARE SYMPTOMS THAT SHOULD BE REPORTED IMMEDIATELY: *FEVER GREATER THAN 100.4 F (38 C) OR HIGHER *CHILLS OR SWEATING *NAUSEA AND VOMITING THAT IS NOT CONTROLLED WITH YOUR NAUSEA MEDICATION *UNUSUAL SHORTNESS OF BREATH *UNUSUAL BRUISING OR BLEEDING *URINARY PROBLEMS (pain or burning when urinating, or frequent urination) *BOWEL PROBLEMS (unusual diarrhea, constipation, pain near the anus) TENDERNESS IN MOUTH AND THROAT WITH OR WITHOUT PRESENCE OF ULCERS (sore throat, sores in mouth, or a toothache) UNUSUAL RASH, SWELLING OR PAIN  UNUSUAL VAGINAL DISCHARGE OR ITCHING   Items with * indicate a potential emergency and should be followed up as soon as possible or go to the Emergency Department if any problems should occur.  Please show the CHEMOTHERAPY ALERT CARD or IMMUNOTHERAPY ALERT CARD at check-in to the  Emergency Department and triage nurse.  Should you have questions after your visit or need to cancel or reschedule your appointment, please contact  [facility] [phone]  and follow the prompts.  Office hours are 8:00 a.m. to 4:30 p.m. Monday - Friday. Please note that voicemails left after 4:00 p.m. may not be returned until the following business day.  We are closed weekends and major holidays. You have access to a nurse at all times for urgent questions. Please call the main number to the clinic [phone] and follow the prompts.  For any non-urgent questions, you may also contact your provider using MyChart. We now offer e-Visits for anyone 18 and older to request care online for non-urgent symptoms. For details visit [email or web address].   Also download the MyChart app! Go to the app store, search "MyChart", open the app, select [facility], and log in with your MyChart username and password.  Due to Covid, a mask is required upon entering the hospital/clinic. If you do not have a mask, one will be given to you upon arrival. For doctor visits, patients may have 1 support person aged 18 or older with them. For treatment visits, patients cannot have anyone with them due to current Covid guidelines and our immunocompromised population.  

## 2020-12-12 NOTE — Progress Notes (Signed)
Michelle Holt presented for Portacath access and flush.  Portacath located right chest wall accessed with  H 20 needle.  Good blood return present. Portacath flushed with 8ml NS and 500U/72ml Heparin and needle removed intact.  Procedure tolerated well and without incident.   Stable during and after procedure.  AVS reviewed.  Discharged in stable condition ambulatory.

## 2020-12-13 LAB — CA 125: Cancer Antigen (CA) 125: 87.2 U/mL — ABNORMAL HIGH (ref 0.0–38.1)

## 2020-12-13 NOTE — Progress Notes (Signed)
Michelle Holt, [place] [address]   CLINIC:  Medical Oncology/Hematology  PCP:  [name], MD [address] / [place] [address] [phone]   REASON FOR VISIT:  Follow-up for high-grade serous ovarian cancer  PRIOR THERAPY:  1. Carboplatin and paclitaxel x 7 cycles from 08/24/2018 to 03/15/2019. 2. Laparoscopic TAH & BSO & omenectomy on 12/24/2018.  NGS Results: not done  CURRENT THERAPY: surveillance  BRIEF ONCOLOGIC HISTORY:  Oncology History  Ovarian cancer, bilateral ([place])  07/07/2018 Pathology Results   PLEURAL FLUID, LEFT (SPECIMEN 1 OF 1, COLLECTED 07/07/18): - MALIGNANT CELLS CONSISTENT WITH ADENOCARCINOMA - SEE COMMENT  Source Pleural Fluid, (Specimen 1 of 1, collected on 07/07/2018) Gross Specimen: Received is/are 1000cc of bloody red fluid with tissue. (TC:tc) Prepared: # Smears: 0 # Concentration Technique Slides (i.e. ThinPrep): 1 # Cell Block: 1 Conventional Additional Studies: Two Hematology slides labeled [id number] Comment The malignant cells are positive for cytokeratin 7, p53, WT-1, Pax-8, Moc31, ER (weak) and EMA but negative for cytokeratin 20, TTF-1, GATA-3, CDX-2 and D2-40. Overall, the phenotype is consistent with a gynecologic primary; clinical correlation recommended.   07/07/2018 Procedure   Successful ultrasound guided left thoracentesis yielding 2.0 L of pleural fluid   07/08/2018 Procedure   1. Technically successful placement of left 14 French pigtail chest drain, placed to Pleur-evac water-seal.   07/08/2018 Procedure   1. Technically successful five [place] double lumen power injectable PICC placement   07/09/2018 Imaging   Ct chest 1. There is a moderate, loculated left hydropneumothorax with a small air component and moderate fluid component. The largest loculated component is located posteriorly. There is a pigtail drainage catheter about the lateral pleural space. There is no obvious etiology, such as  obvious mass or pleural disease.   2. There is a small right pleural effusion with associated atelectasis or consolidation and a subpleural consolidation of the superior segment right lower lobe (series 4, image 56), of uncertain significance, possibly infectious or inflammatory   07/10/2018 Imaging   Ct abdomen and pelvis: 1. The bilateral ovaries are enlarged by heterogeneous appearing cystic lesions, measuring 5.3 x 4.2 cm on the right (series 4, image 72) and 4.5 x 3.2 cm on the left (series 4, image 75). Consider dedicated pelvic ultrasound and/or pelvic MRI to further evaluate for solid components given high suspicion for GYN primary malignancy.   2. No other evidence of mass and no lymphadenopathy in the abdomen or pelvis.   3. Trace ascites. There is some suggestion of omental and peritoneal nodularity (e.g. Series 4, image 74), concerning for peritoneal metastatic disease.    4. Loculated left-sided pleural effusion with left-sided pleural drainage catheter in position. Small right pleural effusion   07/13/2018 Surgery   OPERATION: 1.  Left VATS (video-assisted thoracoscopic surgery) for drainage of loculated pleural effusion. 2.  Talc pleurodesis for malignant pleural effusion. 3.  Placement of PleurX catheter for management of malignant pleural effusion. 4.  Placement of On-Q analgesia catheter system.    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   07/13/2018 Pathology Results   Pleura, peel, Left Pleural - FIBRO-FIBRINOUS PLEURITIS - NEGATIVE FOR MALIGNANCY   07/18/2018 Initial Diagnosis   Ovarian cancer, bilateral ([facility])   07/20/2018 Procedure   EGD impression: Normal proximal esophagus and mid esophagus. Mild distal  esophageal rings; dilation not performed because of esophagitis. LA Grade  C reflux esophagitis. Z-line regular, 30 cm from the incisors. 5 cm hiatal hernia. Non-bleeding gastric ulcer with no stigmata of bleeding. Gastritis. Duodenal erosions without bleeding. Normal second portion of the duodenum. No specimens collected.   08/19/2018 Genetic Testing   Negative genetic testing on the common hereditary cancer panel.  The Common Hereditary Gene Panel offered by [facility] includes sequencing and/or deletion duplication testing of the following 48 genes: APC, ATM, AXIN2, BARD1, BMPR1A, BRCA1, BRCA2, BRIP1, CDH1, CDK4, CDKN2A (p14ARF), CDKN2A (p16INK4a), CHEK2, CTNNA1, DICER1, EPCAM (Deletion/duplication testing only), GREM1 (promoter region deletion/duplication testing only), KIT, MEN1, MLH1, MSH2, MSH3, MSH6, MUTYH, NBN, NF1, NHTL1, PALB2, PDGFRA, PMS2, POLD1, POLE, PTEN, RAD50, RAD51C, RAD51D, RNF43, SDHB, SDHC, SDHD, SMAD4, SMARCA4. STK11, TP53, TSC1, TSC2, and VHL.  The following genes were evaluated for sequence changes only: SDHA and HOXB13 c.251G>A variant only. The report date is August 19, 2018.   08/24/2018 -  Chemotherapy   The patient had palonosetron (ALOXI) injection 0.25 mg, 0.25 mg, Intravenous,  Once, 7 of 7 cycles Administration: 0.25 mg (08/24/2018), 0.25 mg (09/14/2018), 0.25 mg (10/05/2018), 0.25 mg (12/01/2018), 0.25 mg (01/27/2019), 0.25 mg (02/22/2019), 0.25 mg (03/15/2019) CARBOplatin (PARAPLATIN) 740 mg in sodium chloride 0.9 % 250 mL chemo infusion, 740 mg (100 % of original dose 742.8 mg), Intravenous,  Once, 7 of 7 cycles Dose modification:   (original dose 742.8 mg, Cycle 1),   (original dose 736.8 mg, Cycle 2),   (original dose 736.8 mg, Cycle 3),   (original dose 736.8 mg, Cycle 4),   (original dose 736.8 mg, Cycle 5),   (original dose 736.8 mg, Cycle 6),   (original dose 736.8 mg, Cycle 7) Administration: 740 mg (08/24/2018), 740 mg (09/14/2018), 740 mg (10/05/2018), 740 mg (12/01/2018), 740 mg (01/27/2019), 740 mg (02/22/2019), 740 mg (03/15/2019) PACLitaxel  (TAXOL) 324 mg in sodium chloride 0.9 % 500 mL chemo infusion (> $RemoveBef'80mg'pSkdpHZLaQ$ /m2), 175 mg/m2 = 324 mg, Intravenous,  Once, 7 of 7 cycles Administration: 324 mg (08/24/2018), 324 mg (09/14/2018), 324 mg (10/05/2018), 324 mg (12/01/2018), 324 mg (01/27/2019), 324 mg (02/22/2019), 324 mg (03/15/2019) fosaprepitant (EMEND) 150 mg, dexamethasone (DECADRON) 12 mg in sodium chloride 0.9 % 145 mL IVPB, , Intravenous,  Once, 7 of 7 cycles Administration:  (08/24/2018),  (09/14/2018),  (10/05/2018),  (12/01/2018),  (01/27/2019),  (02/22/2019),  (03/15/2019)   for chemotherapy treatment.       CANCER STAGING: Cancer Staging  No matching staging information was found for the patient.  INTERVAL HISTORY:  [name], a 50 y.o. female, returns for routine follow-up of her high-grade serous ovarian cancer. Michelle Holt was last seen on 11/21/2020.   Today she reports feeling fair. She reports lower back pain, continued abdominal cramping, and sharp right sided abdominal pain which is worsened when coughing. She denies any current bleeding. She reports drenching night sweats and hot flashes starting 1 week ago, occurring every night. She reports she has had a cold over the past week.   REVIEW OF SYSTEMS:  Review of Systems  Constitutional:  Positive for fatigue (25%). Negative for appetite change (50%).  HENT:   Negative for nosebleeds.   Respiratory:  Positive for cough and shortness of breath. Negative for hemoptysis.   Gastrointestinal:  Positive for abdominal pain (8/10 R lower) and nausea. Negative for blood in stool.  Endocrine: Positive for hot flashes.  Genitourinary:  Negative for hematuria.   Musculoskeletal:  Positive for back pain (lower).  Neurological:  Positive for headaches and numbness.  Hematological:  Does not bruise/bleed easily.  Psychiatric/Behavioral:  Positive for depression and sleep disturbance.   All other systems reviewed and are negative.  PAST MEDICAL/SURGICAL HISTORY:  Past Medical History:   Diagnosis Date   Anemia    Anxiety and depression    Arthritis of facet joints at multiple vertebral levels    L5-S1   Constipation    Dyslipidemia    Family history of breast cancer    Family history of uterine cancer    GERD (gastroesophageal reflux disease)    History of hiatal hernia    History of kidney stones    Insomnia    Irritable bowel syndrome    Migraine    Muscle tension headache    Neuropathy of finger    Ovarian carcinoma ([facility])    ovarian   Plantar fasciitis of right foot    Port-A-Cath in place 08/20/2018   Past Surgical History:  Procedure Laterality Date   ABDOMINAL HYSTERECTOMY     BIOPSY  10/27/2020   Procedure: BIOPSY;  Surgeon: Harvel Quale, MD;  [facility];  Service: Gastroenterology;;   CHOLECYSTECTOMY  2008   COLONOSCOPY N/A 08/13/2013   Procedure: COLONOSCOPY;  Surgeon: Rogene Houston, MD;  [facility];  Service: Endoscopy;  Laterality: N/A;  230-moved to 145 Ann to notify pt   COLONOSCOPY WITH PROPOFOL N/A 11/15/2020   Procedure: COLONOSCOPY WITH PROPOFOL;  Surgeon: Rogene Houston, MD;  [facility];  Service: Endoscopy;  Laterality: N/A;  1:40   ESOPHAGOGASTRODUODENOSCOPY     ESOPHAGOGASTRODUODENOSCOPY (EGD) WITH PROPOFOL N/A 07/20/2018   Procedure: ESOPHAGOGASTRODUODENOSCOPY (EGD) WITH PROPOFOL;  Surgeon: Rogene Houston, MD;  [facility];  Service: Endoscopy;  Laterality: N/A;  Possible esophageal dilation.   ESOPHAGOGASTRODUODENOSCOPY (EGD) WITH PROPOFOL N/A 10/27/2020   Procedure: ESOPHAGOGASTRODUODENOSCOPY (EGD) WITH PROPOFOL;  Surgeon: Harvel Quale, MD;  [facility];  Service: Gastroenterology;  Laterality: N/A;  2:10, pt knows to arrive at 10:15   [place] 11/24/2020   Procedure: [place];  Surgeon: Rogene Houston, MD;  [facility];  Service: Endoscopy;  Laterality: N/A;  7:30   IR ANGIOGRAM SELECTIVE EACH  ADDITIONAL VESSEL  08/01/2018   IR ANGIOGRAM VISCERAL SELECTIVE  08/01/2018   IR EMBO ART  VEN HEMORR LYMPH EXTRAV  INC GUIDE ROADMAPPING  08/01/2018   IR IMAGING GUIDED PORT INSERTION  08/20/2018   IR PERC PLEURAL DRAIN W/INDWELL CATH W/IMG GUIDE  07/08/2018   IR THORACENTESIS ASP PLEURAL SPACE W/IMG GUIDE  07/07/2018   IR US GUIDE VASC ACCESS RIGHT  08/01/2018   PLEURAL EFFUSION DRAINAGE Left 07/13/2018   Procedure: DRAINAGE OF LOCULATED PLEURAL EFFUSION;  Surgeon: Ivin Poot, MD;  Location: [facility];  Service: Thoracic;  Laterality: Left;   POLYPECTOMY  11/15/2020   Procedure: POLYPECTOMY;  Surgeon: Rogene Houston, MD;  [facility];  Service: Endoscopy;;   REMOVAL OF PLEURAL DRAINAGE CATHETER Left 08/20/2018   Procedure: REMOVAL OF PLEURAL DRAINAGE CATHETER;  Surgeon: Ivin Poot, MD;  Location: [facility];  Service: Thoracic;  Laterality: Left;   REMOVAL OF PLEURAL DRAINAGE CATHETER Left 08/20/2018   Procedure: REMOVAL OF PLEURAL DRAINAGE CATHETER;  Surgeon: Ivin Poot, MD;  Location: [facility];  Service: Thoracic;  Laterality: Left;   TALC PLEURODESIS Left 07/13/2018   Procedure: Talc Pleuradesis;  Surgeon: Ivin Poot, MD;  Location:  [facility];  Service: Thoracic;  Laterality: Left;   TOTAL HIP ARTHROPLASTY Left 06/05/2020   Procedure: LEFT TOTAL HIP ARTHROPLASTY ANTERIOR APPROACH;  Surgeon: Leandrew Koyanagi, MD;  Location: [facility];  Service: Orthopedics;  Laterality: Left;  3-C   TUBAL LIGATION Bilateral    UTERINE ABLATION     VIDEO ASSISTED THORACOSCOPY Left 07/13/2018   Procedure: VIDEO ASSISTED THORACOSCOPY;  Surgeon: Ivin Poot, MD;  [facility]: [facility];  Service: Thoracic;  Laterality: Left;    SOCIAL HISTORY:  Social History   Socioeconomic History   Marital status: Widowed    Spouse name: Not on file   Number of children: 2   Years of education: 2-College   Highest education level: Not on file  Occupational History    Employer: [employer]  Tobacco Use   Smoking status:  Former    Packs/day: 0.50    Years: 17.00    Pack years: 8.50    Types: Cigarettes    Quit date: 06/22/2018    Years since quitting: 2.4   Smokeless tobacco: Never  Vaping Use   Vaping Use: Never used  Substance and Sexual Activity   Alcohol use: Yes    Alcohol/week: 1.0 standard drink    Types: 1 Glasses of wine per week    Comment: Drinks alcohol on occasion   Drug use: No   Sexual activity: Not on file  Other Topics Concern   Not on file  Social History Narrative   Patient lives at home with her daughter.    Patient has 2 children.    Patient is widowed.    Patient is right handed.    Patient has her Associates degree.      Social Determinants of Health   Financial Resource Strain: Not on file  Food Insecurity: Not on file  Transportation Needs: Not on file  Physical Activity: Not on file  Stress: Not on file  Social Connections: Not on file  Intimate Partner Violence: Not on file    FAMILY HISTORY:  Family History  Problem Relation Age of Onset   Hypertension Mother    Obesity Mother    Diabetes Mother    Kidney disease Mother    Peripheral vascular disease Father    Atrial fibrillation Father    COPD Brother    Osteoporosis Brother    Crohn's disease Sister    Uterine cancer Sister 71       maternal half sister   Breast cancer Maternal Aunt 82   Colon cancer Neg Hx     CURRENT MEDICATIONS:  Current Outpatient Medications  Medication Sig Dispense Refill   albuterol (VENTOLIN HFA) 108 (90 Base) MCG/ACT inhaler Inhale 2 puffs into the lungs every 6 (six) hours as needed for wheezing or shortness of breath. (Patient not taking: Reported on 12/12/2020)     Ascorbic Acid (VITAMIN C) 1000 MG tablet Take 2,000 mg by mouth daily.     B Complex-C (B-COMPLEX WITH VITAMIN C) tablet Take 1 tablet by mouth daily.     Cholecalciferol (VITAMIN D) 50 MCG (2000 UT) CAPS Take 2,000 Units by mouth daily.     diazepam (VALIUM) 5 MG tablet TAKE ONE TABLET BY MOUTH AT  BEDTIME 30 tablet 5   docusate sodium (COLACE) 100 MG capsule Take 1 capsule (100 mg total) by mouth daily as needed. To be taken after surgery 30 capsule 2   gabapentin (NEURONTIN) 100 MG capsule TAKE TWO (2) CAPSULES BY MOUTH AT BEDTIME 60 capsule  2   lidocaine-prilocaine (EMLA) cream Apply small amount to port a cath site and cover with plastic wrap one hour prior to chemotherapy appointments 30 g 3   Magnesium 500 MG TABS Take 500 mg by mouth in the morning.     omeprazole (PRILOSEC) 40 MG capsule Take 1 capsule (40 mg total) by mouth 2 (two) times daily. 180 capsule 3   ondansetron (ZOFRAN) 4 MG tablet Take 1 tablet (4 mg total) by mouth every 8 (eight) hours as needed for nausea or vomiting. 40 tablet 0   Oxycodone HCl 10 MG TABS Take 1 tablet (10 mg total) by mouth every 4 (four) hours as needed. 168 tablet 0   sennosides-docusate sodium (SENOKOT-S) 8.6-50 MG tablet Take 1 tablet by mouth daily as needed for constipation.     SUMAtriptan (IMITREX) 100 MG tablet TAKE ONE TABLET BY MOUTH PRN UP to TWICE DAILY AS NEEDED. 9 tablet 5   tiZANidine (ZANAFLEX) 4 MG tablet Take 1 tablet (4 mg total) by mouth 3 (three) times daily as needed for muscle spasms. 60 tablet 0   triamcinolone cream (KENALOG) 0.1 % Apply 1 application topically 2 (two) times daily as needed (rash). 80 g 11   venlafaxine XR (EFFEXOR-XR) 150 MG 24 hr capsule TAKE ONE CAPSULE BY MOUTH DAILY WITH BREAKFAST (Patient taking differently: Take 150 mg by mouth at bedtime.) 30 capsule 6   No current facility-administered medications for this visit.   Facility-Administered Medications Ordered in Other Visits  Medication Dose Route Frequency Provider Last Rate Last Admin   0.9 %  sodium chloride infusion   Intravenous Continuous Derek Jack, MD   Stopped at 10/23/20 1547   sodium chloride flush (NS) 0.9 % injection 10 mL  10 mL Intravenous PRN Derek Jack, MD   10 mL at 10/23/20 1544    ALLERGIES:  Allergies   Allergen Reactions   Morphine And Related Itching   Nickel Itching   Nortriptyline Other (See Comments)    Significant weight gain   Topamax [Topiramate] Diarrhea and Nausea Only   Xanax [Alprazolam]     Can't wake up    Actifed Cold-Allergy [Chlorpheniramine-Phenyleph Er] Rash   Amoxicillin Rash    Did it involve swelling of the face/tongue/throat, SOB, or low BP? Unknown Did it involve sudden or severe rash/hives, skin peeling, or any reaction on the inside of your mouth or nose? Unknown Did you need to seek medical attention at a hospital or doctor's office? Unknown When did it last happen? teenager       If all above answers are "NO", may proceed with cephalosporin use.    Codeine Hives   Erythromycin Rash   Penicillins Rash    Did it involve swelling of the face/tongue/throat, SOB, or low BP? Unknown Did it involve sudden or severe rash/hives, skin peeling, or any reaction on the inside of your mouth or nose? Unknown Did you need to seek medical attention at a hospital or doctor's office? Unknown When did it last happen? teenager       If all above answers are "NO", may proceed with cephalosporin use.    Sudafed [Pseudoephedrine Hcl] Rash    PHYSICAL EXAM:  Performance status (ECOG): 1 - Symptomatic but completely ambulatory  There were no vitals filed for this visit. Wt Readings from Last 3 Encounters:  11/21/20 184 lb 4.8 oz (83.6 kg)  11/13/20 181 lb 1.6 oz (82.1 kg)  11/07/20 181 lb 1.6 oz (82.1 kg)   Physical Exam  Vitals reviewed.  Constitutional:      Appearance: Normal appearance.  Cardiovascular:     Rate and Rhythm: Normal rate and regular rhythm.     Pulses: Normal pulses.     Heart sounds: Normal heart sounds.  Pulmonary:     Effort: Pulmonary effort is normal.     Breath sounds: Normal breath sounds.  Neurological:     General: No focal deficit present.     Mental Status: She is alert and oriented to person, place, and time.  Psychiatric:         Mood and Affect: Mood normal.        Behavior: Behavior normal.     LABORATORY DATA:  I have reviewed the labs as listed.  CBC Latest Ref Rng & Units 12/12/2020 11/30/2020 11/16/2020  WBC 4.0 - 10.5 K/uL 6.3 - 7.2  Hemoglobin 12.0 - 15.0 g/dL 13.5 13.0 12.3  Hematocrit 36.0 - 46.0 % 41.4 38.7 37.9  Platelets 150 - 400 K/uL 327 - 244   CMP Latest Ref Rng & Units 12/12/2020 11/16/2020 10/05/2020  Glucose 70 - 99 mg/dL 115(H) 138(H) 99  BUN 6 - 20 mg/dL _0 Creatinine 0.44 - 1.00 mg/dL 0.68 0.70 0.55  Sodium 135 - 145 mmol/L 137 141 138  Potassium 3.5 - 5.1 mmol/L 3.4(L) 3.7 4.2  Chloride 98 - 111 mmol/L 102 107 105  CO2 22 - 32 mmol/L _1 Calcium 8.9 - 10.3 mg/dL 8.9 8.9 8.8(L)  Total Protein 6.5 - 8.1 g/dL 7.3 6.1(L) 6.7  Total Bilirubin 0.3 - 1.2 mg/dL 0.5 0.5 0.2(L)  Alkaline Phos 38 - 126 U/L 116 102 88  AST 15 - 41 U/L 34 21 13(L)  ALT 0 - 44 U/L _2 DIAGNOSTIC IMAGING:  I have independently reviewed the scans and discussed with the patient. CT CHEST ABDOMEN PELVIS W CONTRAST  Result Date: 12/08/2020 CLINICAL DATA:  Ovarian cancer, assess treatment response, status post hysterectomy and chemotherapy EXAM: CT CHEST, ABDOMEN, AND PELVIS WITH CONTRAST TECHNIQUE: Multidetector CT imaging of the chest, abdomen and pelvis was performed following the standard protocol during bolus administration of intravenous contrast. CONTRAST:  33m OMNIPAQUE IOHEXOL 350 MG/ML SOLN, additional oral enteric contrast COMPARISON:  CT abdomen pelvis, 07/10/2020, CT chest, 10/13/2018 FINDINGS: CT CHEST FINDINGS Cardiovascular: Right chest port catheter. Normal heart size. No pericardial effusion. Mediastinum/Nodes: No enlarged mediastinal, hilar, or axillary lymph nodes. Thymic remnant in the anterior mediastinum. Large hiatal hernia with intrathoracic position of the gastric body and fundus. Thyroid gland, trachea, and esophagus demonstrate no significant findings. Lungs/Pleura: Lungs are  clear. Unchanged high attenuation pleural thickening about the left lung likely related to prior pleurodesis. No pleural effusion or pneumothorax. Musculoskeletal: No chest wall mass or suspicious bone lesions identified. CT ABDOMEN PELVIS FINDINGS Hepatobiliary: No focal liver abnormality is seen. Hepatic steatosis. Status post cholecystectomy. No biliary dilatation. Pancreas: Unremarkable. No pancreatic ductal dilatation or surrounding inflammatory changes. Spleen: Normal in size. Unchanged predominantly fluid attenuation collection at the superior aspect of the spleen measuring 7.2 cm (series 3, image 46). Coil material in the vicinity of the distal splenic artery. Adrenals/Urinary Tract: Adrenal glands are unremarkable. Kidneys are normal, without renal calculi, solid lesion, or hydronephrosis. Bladder is unremarkable. Stomach/Bowel: Stomach is within normal limits. Appendix appears normal. No evidence of bowel wall thickening, distention, or inflammatory changes. Vascular/Lymphatic: Aortic atherosclerosis. Newly enlarged left iliac lymph node measuring 1.3 x 1.1 cm (series 2, image 100). Reproductive:  Status post hysterectomy. There is new soft tissue and or peritoneal thickening in the low pelvis about the hysterectomy site, distal sigmoid colon, rectum, and bladder (series 2, image 103). There is no discrete mass or nodularity appreciated. Other: No abdominal wall hernia or abnormality. No abdominopelvic ascites. Musculoskeletal: No acute or significant osseous findings. Status post left hip total arthroplasty. IMPRESSION: 1. Status post hysterectomy. 2. There is new soft tissue and or peritoneal thickening in the low pelvis about the hysterectomy site, distal sigmoid colon, rectum, and bladder. There is no discrete mass or nodularity appreciated. Findings are concerning for locally recurrent/peritoneal metastatic disease. 3. Newly enlarged left iliac lymph node measuring 1.3 x 1.1 cm, concerning for nodal  metastatic disease. 4. Unchanged predominantly fluid attenuation collection at the superior aspect of the spleen measuring 7.2 cm, consistent with chronic hematoma/seroma. 5. Large hiatal hernia. 6. Hepatic steatosis. These results will be called to the ordering clinician or representative by the Radiologist Assistant, and communication documented in the PACS or [employer]. Aortic Atherosclerosis (ICD10-I70.0). Electronically Signed   By: Delanna Ahmadi M.D.   On: 12/08/2020 08:27     ASSESSMENT:  1.  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 -Robotic assisted laparoscopic TAH and BSO and omentectomy on 12/24/2018, pathology showing high-grade serous carcinoma, PT3P NX. -Germline mutation testing was negative. -3 more cycles of adjuvant chemotherapy completed on 03/15/2019. -CTAP on 04/13/2019 showed no findings of active malignancy.  28% reduction in the volume of presumed chronic hematoma/chronic fluid collection splaying the upper margin of the spleen.  Large type III hiatal hernia. -CTAP on 10/13/2019 shows no findings of recurrence or metastatic disease.    PLAN:  1.  Clinical stage IVa high-grade serous ovarian cancer, positive cytology of left pleural effusion: -She reports occasional cramping in the left lower quadrant, feels like menstrual cramping. - Reviewed labs from 12/12/2020.  CA125 has increased to 87.2 from 59 on 11/16/2020. - Reviewed CT CAP from 12/06/2020.  There is some peritoneal thickening in the lower pelvis without any discrete mass or nodularity.  There is enlarged left iliac lymph node measuring 1.3 cm.  Splenic fluid collection is unchanged.  Hepatic steatosis. - She reported night sweats for the last 1 week but she had upper respiratory infection and has been taking over-the-counter medications. - She has worsening CA125 level.  No gross  evidence of recurrence on the CT scans.  She is not symptomatic at this time other than cramping. - We have discussed the various options including watchful waiting versus systemic therapy. - I would like to get an opinion from GYN oncology.  We will make a referral to Dr. Berline Lopes. - RTC 4 to 6 weeks.   2.  Generalized pain/left upper quadrant pain: - Continue oxycodone 10 mg up to 4 to 6 tablets daily.   3.  Peripheral neuropathy: - Continue gabapentin at bedtime.   4.  Hot flashes: - Continue Effexor 150 mg daily which is controlling.   5.  Severe normocytic anemia: - She had EGD, colonoscopy and recent capsule study which did not show any evidence of for source of bleeding. - Last iron infusion on 10/23/2020. - Today ferritin is 68 and hemoglobin 13.5.  We will check it again in 4 to 6 weeks.   Orders placed this encounter:  No orders of the defined types were placed in this encounter.    Derek Jack,  MD [place] [phone]   I, Thana Ates, am acting as a scribe for Dr. Derek Jack.  I, Derek Jack MD, have reviewed the above documentation for accuracy and completeness, and I agree with the above.

## 2020-12-14 ENCOUNTER — Encounter (HOSPITAL_COMMUNITY): Payer: Self-pay | Admitting: Hematology

## 2020-12-14 ENCOUNTER — Ambulatory Visit (HOSPITAL_COMMUNITY): Payer: Medicare Other | Admitting: Hematology

## 2020-12-14 ENCOUNTER — Telehealth: Payer: Self-pay | Admitting: *Deleted

## 2020-12-14 ENCOUNTER — Other Ambulatory Visit: Payer: Self-pay

## 2020-12-14 ENCOUNTER — Inpatient Hospital Stay (HOSPITAL_COMMUNITY): Payer: Medicare Other | Attending: Hematology | Admitting: Hematology

## 2020-12-14 VITALS — BP 144/90 | HR 71 | Temp 97.9°F | Resp 18 | Ht 64.96 in | Wt 181.9 lb

## 2020-12-14 DIAGNOSIS — R61 Generalized hyperhidrosis: Secondary | ICD-10-CM | POA: Insufficient documentation

## 2020-12-14 DIAGNOSIS — G629 Polyneuropathy, unspecified: Secondary | ICD-10-CM | POA: Insufficient documentation

## 2020-12-14 DIAGNOSIS — R1012 Left upper quadrant pain: Secondary | ICD-10-CM | POA: Diagnosis not present

## 2020-12-14 DIAGNOSIS — Z79899 Other long term (current) drug therapy: Secondary | ICD-10-CM | POA: Insufficient documentation

## 2020-12-14 DIAGNOSIS — Z90722 Acquired absence of ovaries, bilateral: Secondary | ICD-10-CM | POA: Insufficient documentation

## 2020-12-14 DIAGNOSIS — D649 Anemia, unspecified: Secondary | ICD-10-CM | POA: Insufficient documentation

## 2020-12-14 DIAGNOSIS — C563 Malignant neoplasm of bilateral ovaries: Secondary | ICD-10-CM | POA: Diagnosis present

## 2020-12-14 DIAGNOSIS — Z87891 Personal history of nicotine dependence: Secondary | ICD-10-CM | POA: Diagnosis not present

## 2020-12-14 DIAGNOSIS — Z90710 Acquired absence of both cervix and uterus: Secondary | ICD-10-CM | POA: Insufficient documentation

## 2020-12-14 DIAGNOSIS — M545 Low back pain, unspecified: Secondary | ICD-10-CM | POA: Insufficient documentation

## 2020-12-14 DIAGNOSIS — Z9079 Acquired absence of other genital organ(s): Secondary | ICD-10-CM | POA: Insufficient documentation

## 2020-12-14 DIAGNOSIS — R232 Flushing: Secondary | ICD-10-CM | POA: Diagnosis not present

## 2020-12-14 DIAGNOSIS — R971 Elevated cancer antigen 125 [CA 125]: Secondary | ICD-10-CM | POA: Diagnosis not present

## 2020-12-14 MED ORDER — LEVOFLOXACIN 750 MG PO TABS: 750.0000 mg | ORAL_TABLET | Freq: Every day | ORAL | 0 refills | Status: DC

## 2020-12-14 NOTE — Patient Instructions (Signed)
[place] at [facility] Discharge Instructions  You were seen and examined today by Dr. Delton Coombes. He reviewed your most recent labs and everything looks okay except your tumor marker. He wants you to consult with Dr. Berline Lopes gynecology oncologist. Please keep follow up appointment as scheduled.   Thank you for choosing [place] at [facility] to provide your oncology and hematology care.  To afford each patient quality time with our provider, please arrive at least 15 minutes before your scheduled appointment time.   If you have a lab appointment with the [facility] please come in thru the Main Entrance and check in at the main information desk.  You need to re-schedule your appointment should you arrive 10 or more minutes late.  We strive to give you quality time with our providers, and arriving late affects you and other patients whose appointments are after yours.  Also, if you no show three or more times for appointments you may be dismissed from the clinic at the providers discretion.     Again, thank you for choosing [facility].  Our hope is that these requests will decrease the amount of time that you wait before being seen by our physicians.       _____________________________________________________________  Should you have questions after your visit to [facility], please contact our office at [phone] and follow the prompts.  Our office hours are 8:00 a.m. and 4:30 p.m. Monday - Friday.  Please note that voicemails left after 4:00 p.m. may not be returned until the following business day.  We are closed weekends and major holidays.  You do have access to a nurse 24-7, just call the main number to the clinic [phone] and do not press any options, hold on the line and a nurse will answer the phone.    For prescription refill requests, have your pharmacy contact our office and allow 72 hours.    Due  to Covid, you will need to wear a mask upon entering the hospital. If you do not have a mask, a mask will be given to you at the Main Entrance upon arrival. For doctor visits, patients may have 1 support person age 7 or older with them. For treatment visits, patients can not have anyone with them due to social distancing guidelines and our immunocompromised population.

## 2020-12-14 NOTE — Telephone Encounter (Signed)
Per Dr Tomie China office scheduled the patient to see Dr Berline Lopes on 12/28 at 3:30 pm

## 2020-12-19 ENCOUNTER — Encounter: Payer: Self-pay | Admitting: Orthopaedic Surgery

## 2020-12-19 ENCOUNTER — Telehealth (INDEPENDENT_AMBULATORY_CARE_PROVIDER_SITE_OTHER): Payer: Self-pay | Admitting: *Deleted

## 2020-12-19 ENCOUNTER — Ambulatory Visit (INDEPENDENT_AMBULATORY_CARE_PROVIDER_SITE_OTHER): Payer: Medicare Other | Admitting: Orthopaedic Surgery

## 2020-12-19 ENCOUNTER — Other Ambulatory Visit: Payer: Self-pay

## 2020-12-19 ENCOUNTER — Ambulatory Visit: Payer: Self-pay

## 2020-12-19 DIAGNOSIS — Z96642 Presence of left artificial hip joint: Secondary | ICD-10-CM | POA: Diagnosis not present

## 2020-12-19 NOTE — Progress Notes (Signed)
Post-Op Visit Note   Patient: Michelle Holt           Date of Birth: 09/09/1970           MRN: [id number] Visit Date: 12/19/2020 PCP: [name], MD   Assessment & Plan:  Chief Complaint:  Chief Complaint  Patient presents with   Left Hip - Pain   Visit Diagnoses:  1. Status post total replacement of left hip     Plan: Patient is a pleasant 50 year old female who comes in today 25-month status post left total hip replacement 06/05/2020.  She has been doing fairly well.  She still notes some fatigue to the left hip which had started to improve working with physical therapy but she notes she had to stop therapy due to drop in hemoglobin.  She is currently being worked up for this.  She also complains of slight numbness to the lateral hip.  Examination left hip reveals a well-healed surgical incision without complication.  Painless logroll.  She is neurovascular intact distally.  At this point, she will continue with a home exercise program but will resume formal physical therapy when able.  Dental prophylaxis reinforced.  Follow-up with [place] in 6 months time when she is 1 year out from surgery with [facility] pelvis x-rays.  Call with concerns or questions in meantime.  Follow-Up Instructions: Return in about 6 months (around 06/19/2021).   Orders:  Orders Placed This Encounter  Procedures   XR Pelvis 1-2 Views   No orders of the defined types were placed in this encounter.   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   Splenic laceration 07/31/2018   Family history of uterine cancer    Family history of breast cancer    Symptomatic anemia 07/18/2018   Upper GI bleeding 07/18/2018   Ovarian cancer, bilateral ([place]) 07/18/2018   HCAP (healthcare-associated pneumonia) 07/18/2018   Pleural effusion 07/07/2018   Migraine 07/06/2018   Pleural effusion on left 07/06/2018   Tobacco abuse 07/06/2018   Generalized anxiety disorder 05/02/2014   Unspecified constipation 08/10/2013   Rectal bleeding 08/10/2013   Lumbosacral spondylosis without myelopathy 10/23/2011   Intractable migraine without aura 10/23/2011   Past Medical History:  Diagnosis Date   Anemia    Anxiety and depression    Arthritis of facet joints at multiple vertebral levels    L5-S1   Constipation    Dyslipidemia    Family history of breast cancer    Family history of uterine cancer    GERD (gastroesophageal reflux disease)    History of hiatal hernia    History of kidney stones    Insomnia    Irritable bowel syndrome    Migraine    Muscle tension headache    Neuropathy of finger    Ovarian carcinoma (HCC)    ovarian   Plantar fasciitis of right foot    Port-A-Cath in place 08/20/2018    Family History  Problem Relation Age of Onset   Hypertension Mother    Obesity Mother    Diabetes Mother    Kidney disease Mother    Peripheral vascular disease Father    Atrial fibrillation Father    COPD Brother    Osteoporosis Brother    Crohn's disease Sister    Uterine cancer Sister 90       maternal half sister   Breast cancer Maternal Aunt 25   Colon cancer Neg Hx     Past Surgical History:  Procedure Laterality Date   ABDOMINAL HYSTERECTOMY     BIOPSY  10/27/2020   Procedure: BIOPSY;  Surgeon: Harvel Quale, MD;  [facility];  Service: Gastroenterology;;   CHOLECYSTECTOMY  2008   COLONOSCOPY N/A 08/13/2013   Procedure: COLONOSCOPY;   Surgeon: Rogene Houston, MD;  [facility];  Service: Endoscopy;  Laterality: N/A;  230-moved to 145 Ann to notify pt   COLONOSCOPY WITH PROPOFOL N/A 11/15/2020   Procedure: COLONOSCOPY WITH PROPOFOL;  Surgeon: Rogene Houston, MD;  [facility];  Service: Endoscopy;  Laterality: N/A;  1:40   ESOPHAGOGASTRODUODENOSCOPY     ESOPHAGOGASTRODUODENOSCOPY (EGD) WITH PROPOFOL N/A 07/20/2018   Procedure: ESOPHAGOGASTRODUODENOSCOPY (EGD) WITH PROPOFOL;  Surgeon: Rogene Houston, MD;  [facility];  Service: Endoscopy;  Laterality: N/A;  Possible esophageal dilation.   ESOPHAGOGASTRODUODENOSCOPY (EGD) WITH PROPOFOL N/A 10/27/2020   Procedure: ESOPHAGOGASTRODUODENOSCOPY (EGD) WITH PROPOFOL;  Surgeon: Harvel Quale, MD;  [facility];  Service: Gastroenterology;  Laterality: N/A;  2:10, pt knows to arrive at 10:15   [place] 11/24/2020   Procedure: [place];  Surgeon: Rogene Houston, MD;  [facility];  Service: Endoscopy;  Laterality: N/A;  7:30   IR ANGIOGRAM SELECTIVE EACH ADDITIONAL VESSEL  08/01/2018   IR ANGIOGRAM VISCERAL SELECTIVE  08/01/2018   IR EMBO ART  VEN HEMORR LYMPH EXTRAV  INC GUIDE ROADMAPPING  08/01/2018   IR IMAGING GUIDED PORT INSERTION  08/20/2018   IR PERC PLEURAL DRAIN W/INDWELL CATH W/IMG GUIDE  07/08/2018   IR THORACENTESIS ASP PLEURAL SPACE W/IMG GUIDE  07/07/2018   IR US GUIDE VASC ACCESS RIGHT  08/01/2018   PLEURAL EFFUSION DRAINAGE Left 07/13/2018   Procedure: DRAINAGE OF LOCULATED PLEURAL EFFUSION;  Surgeon: Ivin Poot, MD;  Location: [place];  Service: Thoracic;  Laterality: Left;   POLYPECTOMY  11/15/2020   Procedure: POLYPECTOMY;  Surgeon: Rogene Houston, MD;  [facility];  Service: Endoscopy;;   REMOVAL OF PLEURAL DRAINAGE CATHETER Left 08/20/2018   Procedure: REMOVAL OF PLEURAL DRAINAGE CATHETER;  Surgeon: Ivin Poot, MD;  Location: [place];  Service: Thoracic;   Laterality: Left;   REMOVAL OF PLEURAL DRAINAGE CATHETER Left 08/20/2018   Procedure: REMOVAL OF PLEURAL DRAINAGE CATHETER;  Surgeon: Ivin Poot, MD;  Location: [place];  Service: Thoracic;  Laterality: Left;   TALC PLEURODESIS Left 07/13/2018   Procedure: [place] Pleuradesis;  Surgeon: Prescott Gum, Collier Salina, MD;  Location: [place];  Service: Thoracic;  Laterality: Left;   TOTAL HIP ARTHROPLASTY Left 06/05/2020   Procedure: LEFT TOTAL HIP ARTHROPLASTY ANTERIOR APPROACH;  Surgeon: Leandrew Koyanagi, MD;  Location: [place];  Service: Orthopedics;  Laterality: Left;  3-C   TUBAL LIGATION Bilateral    UTERINE ABLATION     VIDEO ASSISTED THORACOSCOPY Left 07/13/2018   Procedure: VIDEO ASSISTED THORACOSCOPY;  Surgeon: Ivin Poot, MD;  Location: [facility]  OR;  Service: Thoracic;  Laterality: Left;   Social History   Occupational History    Employer: [employer]  Tobacco Use   Smoking status: Former    Packs/day: 0.50    Years: 17.00    Pack years: 8.50    Types: Cigarettes    Quit date: 06/22/2018    Years since quitting: 2.4   Smokeless tobacco: Never  Vaping Use   Vaping Use: Never used  Substance and Sexual Activity   Alcohol use: Yes    Alcohol/week: 1.0 standard drink    Types: 1 Glasses of wine per week    Comment: Drinks alcohol on occasion   Drug use: No   Sexual activity: Not on file

## 2020-12-19 NOTE — Telephone Encounter (Signed)
Patient was called on 11/24/20 and message was left for her to contact [facility] to schedule apt as of 12/19/20 patient has not called them

## 2021-01-09 ENCOUNTER — Other Ambulatory Visit (HOSPITAL_COMMUNITY): Payer: Self-pay

## 2021-01-09 DIAGNOSIS — C563 Malignant neoplasm of bilateral ovaries: Secondary | ICD-10-CM

## 2021-01-09 MED ORDER — OXYCODONE HCL 10 MG PO TABS: 10.0000 mg | ORAL_TABLET | ORAL | 0 refills | Status: DC | PRN

## 2021-01-09 NOTE — Progress Notes (Signed)
Gynecologic Oncology Return Clinic Visit  01/10/21  Reason for Visit: follow-up in the setting of ovarian cancer  Treatment History: Oncology History  Ovarian cancer, bilateral ([place])  07/07/2018 Pathology Results   PLEURAL FLUID, LEFT (SPECIMEN 1 OF 1, COLLECTED 07/07/18): - MALIGNANT CELLS CONSISTENT WITH ADENOCARCINOMA - SEE COMMENT  Source Pleural Fluid, (Specimen 1 of 1, collected on 07/07/2018) Gross Specimen: Received is/are 1000cc of bloody red fluid with tissue. (TC:tc) Prepared: # Smears: 0 # Concentration Technique Slides (i.e. ThinPrep): 1 # Cell Block: 1 Conventional Additional Studies: Two Hematology slides labeled [id number] Comment The malignant cells are positive for cytokeratin 7, p53, WT-1, Pax-8, Moc31, ER (weak) and EMA but negative for cytokeratin 20, TTF-1, GATA-3, CDX-2 and D2-40. Overall, the phenotype is consistent with a gynecologic primary; clinical correlation recommended.   07/07/2018 Procedure   Successful ultrasound guided left thoracentesis yielding 2.0 L of pleural fluid   07/08/2018 Procedure   1. Technically successful placement of left 14 French pigtail chest drain, placed to Pleur-evac water-seal.   07/08/2018 Procedure   1. Technically successful five [place] double lumen power injectable PICC placement   07/09/2018 Imaging   Ct chest 1. There is a moderate, loculated left hydropneumothorax with a small air component and moderate fluid component. The largest loculated component is located posteriorly. There is a pigtail drainage catheter about the lateral pleural space. There is no obvious etiology, such as obvious mass or pleural disease.   2. There is a small right pleural effusion with associated atelectasis or consolidation and a subpleural consolidation of the superior segment right lower lobe (series 4, image 56), of uncertain significance, possibly infectious or inflammatory   07/10/2018 Imaging   Ct abdomen and pelvis: 1. The bilateral  ovaries are enlarged by heterogeneous appearing cystic lesions, measuring 5.3 x 4.2 cm on the right (series 4, image 72) and 4.5 x 3.2 cm on the left (series 4, image 75). Consider dedicated pelvic ultrasound and/or pelvic MRI to further evaluate for solid components given high suspicion for GYN primary malignancy.   2. No other evidence of mass and no lymphadenopathy in the abdomen or pelvis.   3. Trace ascites. There is some suggestion of omental and peritoneal nodularity (e.g. Series 4, image 79), concerning for peritoneal metastatic disease.    4. Loculated left-sided pleural effusion with left-sided pleural drainage catheter in position. Small right pleural effusion   07/13/2018 Surgery   OPERATION: 1.  Left VATS (video-assisted thoracoscopic surgery) for drainage of loculated pleural effusion. 2.  Talc pleurodesis for malignant pleural effusion. 3.  Placement of PleurX catheter for management of malignant pleural effusion. 4.  Placement of On-Q analgesia catheter system.    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   07/13/2018 Pathology Results   Pleura, peel, Left Pleural - FIBRO-FIBRINOUS PLEURITIS - NEGATIVE FOR MALIGNANCY   07/18/2018 Initial Diagnosis   Ovarian cancer, bilateral ([place])   07/20/2018 Procedure   EGD impression: Normal proximal esophagus and mid esophagus. Mild distal esophageal rings; dilation not performed because of esophagitis. LA Grade C reflux esophagitis. Z-line regular, 30 cm from the incisors. 5 cm hiatal hernia. Non-bleeding gastric ulcer with no stigmata of bleeding. Gastritis. Duodenal erosions without bleeding. Normal second portion of the duodenum. No specimens collected.   08/19/2018 Genetic Testing   Negative genetic testing on the common hereditary cancer  panel.  The Common Hereditary Gene Panel offered by [facility] includes sequencing and/or deletion duplication testing of the following 48 genes: APC, ATM, AXIN2, BARD1, BMPR1A, BRCA1, BRCA2, BRIP1, CDH1, CDK4, CDKN2A (p14ARF), CDKN2A (p16INK4a), CHEK2, CTNNA1, DICER1, EPCAM (Deletion/duplication testing only), GREM1 (promoter region deletion/duplication testing only), KIT, MEN1, MLH1, MSH2, MSH3, MSH6, MUTYH, NBN, NF1, NHTL1, PALB2, PDGFRA, PMS2, POLD1, POLE, PTEN, RAD50, RAD51C, RAD51D, RNF43, SDHB, SDHC, SDHD, SMAD4, SMARCA4. STK11, TP53, TSC1, TSC2, and VHL.  The following genes were evaluated for sequence changes only: SDHA and HOXB13 c.251G>A variant only. The report date is August 19, 2018.   08/24/2018 -  Chemotherapy   The patient had palonosetron (ALOXI) injection 0.25 mg, 0.25 mg, Intravenous,  Once, 7 of 7 cycles Administration: 0.25 mg (08/24/2018), 0.25 mg (09/14/2018), 0.25 mg (10/05/2018), 0.25 mg (12/01/2018), 0.25 mg (01/27/2019), 0.25 mg (02/22/2019), 0.25 mg (03/15/2019) CARBOplatin (PARAPLATIN) 740 mg in sodium chloride 0.9 % 250 mL chemo infusion, 740 mg (100 % of original dose 742.8 mg), Intravenous,  Once, 7 of 7 cycles Dose modification:   (original dose 742.8 mg, Cycle 1),   (original dose 736.8 mg, Cycle 2),   (original dose 736.8 mg, Cycle 3),   (original dose 736.8 mg, Cycle 4),   (original dose 736.8 mg, Cycle 5),   (original dose 736.8 mg, Cycle 6),   (original dose 736.8 mg, Cycle 7) Administration: 740 mg (08/24/2018), 740 mg (09/14/2018), 740 mg (10/05/2018), 740 mg (12/01/2018), 740 mg (01/27/2019), 740 mg (02/22/2019), 740 mg (03/15/2019) PACLitaxel (TAXOL) 324 mg in sodium chloride 0.9 % 500 mL chemo infusion (> 3m/m2), 175 mg/m2 = 324 mg, Intravenous,  Once, 7 of 7 cycles Administration: 324 mg (08/24/2018), 324 mg (09/14/2018), 324 mg (10/05/2018), 324 mg (12/01/2018), 324 mg (01/27/2019), 324 mg (02/22/2019), 324 mg (03/15/2019) fosaprepitant (EMEND) 150 mg, dexamethasone (DECADRON) 12 mg in  sodium chloride 0.9 % 145 mL IVPB, , Intravenous,  Once, 7 of 7 cycles Administration:  (08/24/2018),  (09/14/2018),  (10/05/2018),  (12/01/2018),  (01/27/2019),  (02/22/2019),  (03/15/2019)   for chemotherapy treatment.      HRD+ on tumor testing with [facility]  Interval History: Patient reports about 2 to 3 months of multiple symptoms including pelvic cramping, decreased appetite, early satiety, abdominal bloating.  She notes regular and normal bowel and bladder function.  Denies any vaginal bleeding or discharge.  Has continued to struggle with iron deficiency anemia, most recent hemoglobin a month ago was 13.5.  Has undergone work-up including EGD, colonoscopy, small bowel Givens capsule study, and CT scan.  Past Medical/Surgical History: Past Medical History:  Diagnosis Date   Anemia    Anxiety and depression    Arthritis of facet joints at multiple vertebral levels    L5-S1   Constipation    Dyslipidemia    Family history of breast cancer    Family history of uterine cancer    GERD (gastroesophageal reflux disease)    History of hiatal hernia    History of kidney stones    Insomnia    Irritable bowel syndrome    Migraine    Muscle tension headache    Neuropathy of finger    Ovarian carcinoma ([facility])    ovarian   Plantar fasciitis of right foot    Port-A-Cath in place 08/20/2018    Past Surgical History:  Procedure Laterality Date   ABDOMINAL HYSTERECTOMY     BIOPSY  10/27/2020   Procedure: BIOPSY;  Surgeon: CHarvel Quale MD;  [facility];  Service: Gastroenterology;;  CHOLECYSTECTOMY  2008   COLONOSCOPY N/A 08/13/2013   Procedure: COLONOSCOPY;  Surgeon: Rogene Houston, MD;  [facility];  Service: Endoscopy;  Laterality: N/A;  230-moved to 145 Ann to notify pt   COLONOSCOPY WITH PROPOFOL N/A 11/15/2020   Procedure: COLONOSCOPY WITH PROPOFOL;  Surgeon: Rogene Houston, MD;  [facility];  Service: Endoscopy;  Laterality:  N/A;  1:40   ESOPHAGOGASTRODUODENOSCOPY     ESOPHAGOGASTRODUODENOSCOPY (EGD) WITH PROPOFOL N/A 07/20/2018   Procedure: ESOPHAGOGASTRODUODENOSCOPY (EGD) WITH PROPOFOL;  Surgeon: Rogene Houston, MD;  [facility];  Service: Endoscopy;  Laterality: N/A;  Possible esophageal dilation.   ESOPHAGOGASTRODUODENOSCOPY (EGD) WITH PROPOFOL N/A 10/27/2020   Procedure: ESOPHAGOGASTRODUODENOSCOPY (EGD) WITH PROPOFOL;  Surgeon: Harvel Quale, MD;  [facility];  Service: Gastroenterology;  Laterality: N/A;  2:10, pt knows to arrive at 10:15   [place] 11/24/2020   Procedure: [place];  Surgeon: Rogene Houston, MD;  [facility];  Service: Endoscopy;  Laterality: N/A;  7:30   IR ANGIOGRAM SELECTIVE EACH ADDITIONAL VESSEL  08/01/2018   IR ANGIOGRAM VISCERAL SELECTIVE  08/01/2018   IR EMBO ART  VEN HEMORR LYMPH EXTRAV  INC GUIDE ROADMAPPING  08/01/2018   IR IMAGING GUIDED PORT INSERTION  08/20/2018   IR PERC PLEURAL DRAIN W/INDWELL CATH W/IMG GUIDE  07/08/2018   IR THORACENTESIS ASP PLEURAL SPACE W/IMG GUIDE  07/07/2018   IR US GUIDE VASC ACCESS RIGHT  08/01/2018   PLEURAL EFFUSION DRAINAGE Left 07/13/2018   Procedure: DRAINAGE OF LOCULATED PLEURAL EFFUSION;  Surgeon: Ivin Poot, MD;  Location: [facility];  Service: Thoracic;  Laterality: Left;   POLYPECTOMY  11/15/2020   Procedure: POLYPECTOMY;  Surgeon: Rogene Houston, MD;  [facility];  Service: Endoscopy;;   REMOVAL OF PLEURAL DRAINAGE CATHETER Left 08/20/2018   Procedure: REMOVAL OF PLEURAL DRAINAGE CATHETER;  Surgeon: Ivin Poot, MD;  Location: [facility] [place];  Service: Thoracic;  Laterality: Left;   REMOVAL OF PLEURAL DRAINAGE CATHETER Left 08/20/2018   Procedure: REMOVAL OF PLEURAL DRAINAGE CATHETER;  Surgeon: Ivin Poot, MD;  Location: [place] [facility];  Service: Thoracic;  Laterality: Left;   TALC PLEURODESIS Left 07/13/2018   Procedure: Talc Pleuradesis;  Surgeon: Prescott Gum, Collier Salina,  MD;  Location: [place];  Service: Thoracic;  Laterality: Left;   TOTAL HIP ARTHROPLASTY Left 06/05/2020   Procedure: LEFT TOTAL HIP ARTHROPLASTY ANTERIOR APPROACH;  Surgeon: Leandrew Koyanagi, MD;  Location: [place];  Service: Orthopedics;  Laterality: Left;  3-C   TUBAL LIGATION Bilateral    UTERINE ABLATION     VIDEO ASSISTED THORACOSCOPY Left 07/13/2018   Procedure: VIDEO ASSISTED THORACOSCOPY;  Surgeon: Prescott Gum, Collier Salina, MD;  Location: [facility];  Service: Thoracic;  Laterality: Left;    Family History  Problem Relation Age of Onset   Hypertension Mother    Obesity Mother    Diabetes Mother    Kidney disease Mother    Peripheral vascular disease Father    Atrial fibrillation Father    COPD Brother    Osteoporosis Brother    Crohn's disease Sister    Uterine cancer Sister 62       maternal half sister   Breast cancer Maternal Aunt 52   Colon cancer Neg Hx     Social History   Socioeconomic History   Marital status: Widowed    Spouse name: Not on file   Number of children: 2  Years of education: 2-College   Highest education level: Not on file  Occupational History    Employer: [employer]  Tobacco Use   Smoking status: Former    Packs/day: 0.50    Years: 17.00    Pack years: 8.50    Types: Cigarettes    Quit date: 06/22/2018    Years since quitting: 2.5   Smokeless tobacco: Never  Vaping Use   Vaping Use: Never used  Substance and Sexual Activity   Alcohol use: Yes    Alcohol/week: 1.0 standard drink    Types: 1 Glasses of wine per week    Comment: Drinks alcohol on occasion   Drug use: No   Sexual activity: Not on file  Other Topics Concern   Not on file  Social History Narrative   Patient lives at home with her daughter.    Patient has 2 children.    Patient is widowed.    Patient is right handed.    Patient has her Associates degree.      Social Determinants of Health   Financial Resource Strain: Not on file  Food Insecurity: Not on file  Transportation Needs:  Not on file  Physical Activity: Not on file  Stress: Not on file  Social Connections: Not on file    Current Medications:  Current Outpatient Medications:    albuterol (VENTOLIN HFA) 108 (90 Base) MCG/ACT inhaler, Inhale 2 puffs into the lungs every 6 (six) hours as needed for wheezing or shortness of breath., Disp: , Rfl:    Ascorbic Acid (VITAMIN C) 1000 MG tablet, Take 2,000 mg by mouth daily., Disp: , Rfl:    B Complex-C (B-COMPLEX WITH VITAMIN C) tablet, Take 1 tablet by mouth daily., Disp: , Rfl:    Cholecalciferol (VITAMIN D) 50 MCG (2000 UT) CAPS, Take 2,000 Units by mouth daily., Disp: , Rfl:    diazepam (VALIUM) 5 MG tablet, TAKE ONE TABLET BY MOUTH AT BEDTIME, Disp: 30 tablet, Rfl: 5   docusate sodium (COLACE) 100 MG capsule, Take 1 capsule (100 mg total) by mouth daily as needed. To be taken after surgery, Disp: 30 capsule, Rfl: 2   gabapentin (NEURONTIN) 100 MG capsule, TAKE TWO (2) CAPSULES BY MOUTH AT BEDTIME, Disp: 60 capsule, Rfl: 2   lidocaine-prilocaine (EMLA) cream, Apply small amount to port a cath site and cover with plastic wrap one hour prior to chemotherapy appointments, Disp: 30 g, Rfl: 3   Magnesium 500 MG TABS, Take 500 mg by mouth in the morning., Disp: , Rfl:    omeprazole (PRILOSEC) 40 MG capsule, Take 1 capsule (40 mg total) by mouth 2 (two) times daily., Disp: 180 capsule, Rfl: 3   ondansetron (ZOFRAN) 4 MG tablet, Take 1 tablet (4 mg total) by mouth every 8 (eight) hours as needed for nausea or vomiting., Disp: 40 tablet, Rfl: 0   Oxycodone HCl 10 MG TABS, Take 1 tablet (10 mg total) by mouth every 4 (four) hours as needed., Disp: 168 tablet, Rfl: 0   sennosides-docusate sodium (SENOKOT-S) 8.6-50 MG tablet, Take 1 tablet by mouth daily as needed for constipation., Disp: , Rfl:    SUMAtriptan (IMITREX) 100 MG tablet, TAKE ONE TABLET BY MOUTH PRN UP to TWICE DAILY AS NEEDED., Disp: 9 tablet, Rfl: 5   tiZANidine (ZANAFLEX) 4 MG tablet, Take 1 tablet (4 mg total)  by mouth 3 (three) times daily as needed for muscle spasms., Disp: 60 tablet, Rfl: 0   venlafaxine XR (EFFEXOR-XR) 150 MG 24 hr  capsule, TAKE ONE CAPSULE BY MOUTH DAILY WITH BREAKFAST (Patient taking differently: Take 150 mg by mouth at bedtime.), Disp: 30 capsule, Rfl: 6   levofloxacin (LEVAQUIN) 750 MG tablet, Take 1 tablet (750 mg total) by mouth daily. (Patient not taking: Reported on 01/10/2021), Disp: 5 tablet, Rfl: 0   triamcinolone cream (KENALOG) 0.1 %, Apply 1 application topically 2 (two) times daily as needed (rash). (Patient not taking: Reported on 01/10/2021), Disp: 80 g, Rfl: 11 No current facility-administered medications for this visit.  Facility-Administered Medications Ordered in Other Visits:    0.9 %  sodium chloride infusion, , Intravenous, Continuous, Derek Jack, MD, Stopped at 10/23/20 1547   sodium chloride flush (NS) 0.9 % injection 10 mL, 10 mL, Intravenous, PRN, Derek Jack, MD, 10 mL at 10/23/20 1544  Review of Systems: Pertinent positives include fatigue, abdominal distention and pain, hot flashes, joint pain, back pain, muscle pain/cramps, problems with walking, numbness, decreased concentration. Denies appetite changes, fevers, chills, unexplained weight changes. Denies hearing loss, neck lumps or masses, mouth sores, ringing in ears or voice changes. Denies cough or wheezing.  Denies shortness of breath. Denies chest pain or palpitations. Denies leg swelling. Denies blood in stools, constipation, diarrhea, nausea, vomiting, or early satiety. Denies pain with intercourse, dysuria, frequency, hematuria or incontinence. Denies pelvic pain, vaginal bleeding or vaginal discharge.  . Denies itching, rash, or wounds. Denies dizziness, headaches, or seizures. Denies swollen lymph nodes or glands, denies easy bruising or bleeding. Denies anxiety, depression, confusion.  Physical Exam: BP (!) 119/57 (BP Location: Left Arm, Patient Position: Sitting)     Pulse 87    Temp (!) 97.1 F (36.2 C) (Tympanic)    Resp 18    Ht 5' 4" (1.626 m)    Wt 185 lb 6.4 oz (84.1 kg)    LMP  (LMP Unknown)    SpO2 99%    BMI 31.82 kg/m  General: Alert, oriented, no acute distress. HEENT: Normocephalic, atraumatic, sclera anicteric. Chest: Clear to auscultation bilaterally.  No wheezes or rhonchi. Cardiovascular: Regular rate and rhythm, no murmurs. Abdomen: soft, nontender.  Normoactive bowel sounds.  No masses or hepatosplenomegaly appreciated.  Well-healed incisions. Extremities: Grossly normal range of motion.  Warm, well perfused.  No edema bilaterally. Skin: No rashes or lesions noted. Lymphatics: No cervical, supraclavicular, or inguinal adenopathy. GU: Normal appearing external genitalia without erythema, excoriation, or lesions.  Speculum exam reveals mildly atrophic vaginal mucosa, no lesions or masses.  Bimanual exam reveals cuff intact, mild nodularity noted within the cul-de-sac, better appreciated on rectovaginal exam.  Laboratory & Radiologic Studies: Component Ref Range & Units 4 wk ago 1 mo ago 3 mo ago 6 mo ago 8 mo ago 11 mo ago 1 yr ago  Cancer Antigen (CA) 125 0.0 - 38.1 U/mL 87.2 High   59.2 High  CM  36.5 CM  12.6 CM  7.4 CM  6.0 CM  5.0 CM    CT C/A/P 11/25: IMPRESSION: 1. Status post hysterectomy. 2. There is new soft tissue and or peritoneal thickening in the low pelvis about the hysterectomy site, distal sigmoid colon, rectum, and bladder. There is no discrete mass or nodularity appreciated. Findings are concerning for locally recurrent/peritoneal metastatic disease. 3. Newly enlarged left iliac lymph node measuring 1.3 x 1.1 cm, concerning for nodal metastatic disease. 4. Unchanged predominantly fluid attenuation collection at the superior aspect of the spleen measuring 7.2 cm, consistent with chronic hematoma/seroma. 5. Large hiatal hernia. 6. Hepatic steatosis.  Assessment & Plan:  Michelle Holt is a 50 y.o. woman with  findings suspicious for recurrent ovarian cancer who presents for treatment discussion.  I reviewed with the patient and her best friend, who accompanied her to visit today, increasing CA-125, imaging findings, and symptoms, which I think all support recurrence of her ovarian cancer.  From a treatment standpoint, we discussed 3 possibilities.  The first would be continued close surveillance, with repeat CT scan in 3 months to evaluate interval change.  I am a little concerned that her symptoms will continue to worsen, and have encouraged her that if we elect to proceed with surveillance, that she call her medical oncologist if her symptoms become increasingly bothersome.  Second option in the setting of recurrent disease is surgery.  All the patient would be a good candidate from 1 standpoint (low CEA-125, no/minimal ascites, and long disease-free interval), I am concerned that imaging findings represent miliary or peritoneal disease and that I may not be able to resect everything.  I reviewed that the data we have regarding secondary cytoreduction shows improve survival if R0 resection can be achieved.  Otherwise, survival seems to be worsened by an incomplete resection compared to chemotherapy only.  The third option would be to proceed with reinitiation of treatment in the setting of platinum sensitive disease.  I discussed that there are multiple options from a treatment standpoint depending on which toxicities we would like to limit.  Given disease-free interval, I would recommend a platinum containing regimen.  We discussed the use of maintenance therapy either with bevacizumab or PARP inhibitor.  There has been recent publication of overall survival data from some of the trials using PARP inhibitors in the platinum sensitive recurrent setting.  While there is significant issue with the recent data (lack of some of the long-term data including crossover data, studies not powered to assess overall  survival), the FDA has rescinded approval for 1 PARP inhibitor in the non-BRCA mutated recurrence setting.  It is likely that another of the PARP inhibitors approval will be rescinded and this arena.  Olaparib still has approval and I think it may be worth having a discussion with the patient.  While she did not have a BRCA mutation, her tumor was found to be HRD positive.  After discussing these treatment options, the patient voiced that she would like to proceed with surveillance with repeat CT scan in February.  50 minutes of total time was spent for this patient encounter, including preparation, face-to-face counseling with the patient and coordination of care, and documentation of the encounter.  Jeral Pinch, MD  Division of Gynecologic Oncology  Department of Obstetrics and Gynecology  [facility]

## 2021-01-10 ENCOUNTER — Other Ambulatory Visit: Payer: Self-pay | Admitting: Orthopaedic Surgery

## 2021-01-10 ENCOUNTER — Inpatient Hospital Stay: Payer: Medicare Other | Attending: Gynecologic Oncology | Admitting: Gynecologic Oncology

## 2021-01-10 ENCOUNTER — Other Ambulatory Visit: Payer: Self-pay

## 2021-01-10 ENCOUNTER — Encounter: Payer: Self-pay | Admitting: Gynecologic Oncology

## 2021-01-10 VITALS — BP 119/57 | HR 87 | Temp 97.1°F | Resp 18 | Ht 64.0 in | Wt 185.4 lb

## 2021-01-10 DIAGNOSIS — C786 Secondary malignant neoplasm of retroperitoneum and peritoneum: Secondary | ICD-10-CM | POA: Diagnosis not present

## 2021-01-10 DIAGNOSIS — C563 Malignant neoplasm of bilateral ovaries: Secondary | ICD-10-CM

## 2021-01-10 DIAGNOSIS — K760 Fatty (change of) liver, not elsewhere classified: Secondary | ICD-10-CM | POA: Diagnosis not present

## 2021-01-10 DIAGNOSIS — R978 Other abnormal tumor markers: Secondary | ICD-10-CM

## 2021-01-10 DIAGNOSIS — K449 Diaphragmatic hernia without obstruction or gangrene: Secondary | ICD-10-CM | POA: Insufficient documentation

## 2021-01-10 DIAGNOSIS — Z90710 Acquired absence of both cervix and uterus: Secondary | ICD-10-CM | POA: Insufficient documentation

## 2021-01-10 DIAGNOSIS — R198 Other specified symptoms and signs involving the digestive system and abdomen: Secondary | ICD-10-CM

## 2021-01-10 MED ORDER — TIZANIDINE HCL 4 MG PO TABS
4.0000 mg | ORAL_TABLET | Freq: Three times a day (TID) | ORAL | 0 refills | Status: DC | PRN
Start: 2021-01-10 — End: 2021-11-12

## 2021-01-10 NOTE — Patient Instructions (Signed)
It was nice to meet you today.  I will send our discussion summary to Dr. Raliegh Ip.  Based on what we discussed, your preference is to repeat a CT scan 3 months after your last.  If there is evidence of progression of cancer, then you will consider restarting treatment at that time. I would just asked that if you become more symptomatic, and this is significantly impacting her quality of life, please reach out to Dr. Raliegh Ip to discuss starting treatment sooner.

## 2021-01-11 ENCOUNTER — Encounter (HOSPITAL_COMMUNITY): Payer: Self-pay | Admitting: Hematology

## 2021-01-16 ENCOUNTER — Encounter (HOSPITAL_COMMUNITY): Payer: Self-pay | Admitting: Hematology

## 2021-01-16 ENCOUNTER — Other Ambulatory Visit (HOSPITAL_COMMUNITY): Payer: Medicare Other

## 2021-01-19 ENCOUNTER — Other Ambulatory Visit (HOSPITAL_COMMUNITY): Payer: Medicare Other

## 2021-01-20 ENCOUNTER — Other Ambulatory Visit (HOSPITAL_COMMUNITY): Payer: Self-pay | Admitting: Hematology

## 2021-01-20 DIAGNOSIS — R232 Flushing: Secondary | ICD-10-CM

## 2021-01-22 ENCOUNTER — Other Ambulatory Visit (HOSPITAL_COMMUNITY): Payer: Commercial Managed Care - HMO

## 2021-01-22 ENCOUNTER — Encounter (HOSPITAL_COMMUNITY): Payer: Self-pay | Admitting: Hematology

## 2021-01-23 ENCOUNTER — Ambulatory Visit (HOSPITAL_COMMUNITY): Payer: Medicare Other | Admitting: Hematology

## 2021-01-27 ENCOUNTER — Other Ambulatory Visit (HOSPITAL_COMMUNITY): Payer: Self-pay | Admitting: Hematology

## 2021-01-29 ENCOUNTER — Encounter (HOSPITAL_COMMUNITY): Payer: Self-pay | Admitting: Hematology

## 2021-02-05 ENCOUNTER — Inpatient Hospital Stay (HOSPITAL_COMMUNITY): Payer: Medicare Other | Attending: Hematology

## 2021-02-05 ENCOUNTER — Other Ambulatory Visit (HOSPITAL_COMMUNITY): Payer: Self-pay

## 2021-02-05 ENCOUNTER — Encounter (HOSPITAL_COMMUNITY): Payer: Self-pay

## 2021-02-05 ENCOUNTER — Other Ambulatory Visit: Payer: Self-pay

## 2021-02-05 DIAGNOSIS — Z90710 Acquired absence of both cervix and uterus: Secondary | ICD-10-CM | POA: Diagnosis not present

## 2021-02-05 DIAGNOSIS — C563 Malignant neoplasm of bilateral ovaries: Secondary | ICD-10-CM

## 2021-02-05 DIAGNOSIS — E611 Iron deficiency: Secondary | ICD-10-CM | POA: Insufficient documentation

## 2021-02-05 DIAGNOSIS — Z9079 Acquired absence of other genital organ(s): Secondary | ICD-10-CM | POA: Diagnosis not present

## 2021-02-05 DIAGNOSIS — Z9221 Personal history of antineoplastic chemotherapy: Secondary | ICD-10-CM | POA: Diagnosis not present

## 2021-02-05 DIAGNOSIS — G629 Polyneuropathy, unspecified: Secondary | ICD-10-CM | POA: Insufficient documentation

## 2021-02-05 DIAGNOSIS — Z90722 Acquired absence of ovaries, bilateral: Secondary | ICD-10-CM | POA: Diagnosis not present

## 2021-02-05 DIAGNOSIS — R1012 Left upper quadrant pain: Secondary | ICD-10-CM | POA: Insufficient documentation

## 2021-02-05 DIAGNOSIS — Z95828 Presence of other vascular implants and grafts: Secondary | ICD-10-CM

## 2021-02-05 LAB — COMPREHENSIVE METABOLIC PANEL
ALT: 29 U/L (ref 0–44)
AST: 32 U/L (ref 15–41)
Albumin: 3.6 g/dL (ref 3.5–5.0)
Alkaline Phosphatase: 137 U/L — ABNORMAL HIGH (ref 38–126)
Anion gap: 8 (ref 5–15)
BUN: 12 mg/dL (ref 6–20)
CO2: 26 mmol/L (ref 22–32)
Calcium: 8.7 mg/dL — ABNORMAL LOW (ref 8.9–10.3)
Chloride: 99 mmol/L (ref 98–111)
Creatinine, Ser: 0.6 mg/dL (ref 0.44–1.00)
GFR, Estimated: >60 mL/min (ref >60)
Glucose, Bld: 97 mg/dL (ref 70–99)
Potassium: 3.9 mmol/L (ref 3.5–5.1)
Sodium: 133 mmol/L — ABNORMAL LOW (ref 135–145)
Total Bilirubin: 0.5 mg/dL (ref 0.3–1.2)
Total Protein: 7 g/dL (ref 6.5–8.1)

## 2021-02-05 LAB — CBC WITH DIFFERENTIAL/PLATELET
Abs Immature Granulocytes: 0.03 10*3/uL (ref 0.00–0.07)
Basophils Absolute: 0 10*3/uL (ref 0.0–0.1)
Basophils Relative: 0 %
Eosinophils Absolute: 0.3 10*3/uL (ref 0.0–0.5)
Eosinophils Relative: 2 %
HCT: 41.6 % (ref 36.0–46.0)
Hemoglobin: 13.9 g/dL (ref 12.0–15.0)
Immature Granulocytes: 0 %
Lymphocytes Relative: 20 %
Lymphs Abs: 2.3 10*3/uL (ref 0.7–4.0)
MCH: 34.8 pg — ABNORMAL HIGH (ref 26.0–34.0)
MCHC: 33.4 g/dL (ref 30.0–36.0)
MCV: 104 fL — ABNORMAL HIGH (ref 80.0–100.0)
Monocytes Absolute: 0.8 10*3/uL (ref 0.1–1.0)
Monocytes Relative: 7 %
Neutro Abs: 8 10*3/uL — ABNORMAL HIGH (ref 1.7–7.7)
Neutrophils Relative %: 71 %
Platelets: 386 10*3/uL (ref 150–400)
RBC: 4 MIL/uL (ref 3.87–5.11)
RDW: 12.6 % (ref 11.5–15.5)
WBC: 11.4 10*3/uL — ABNORMAL HIGH (ref 4.0–10.5)
nRBC: 0 % (ref 0.0–0.2)

## 2021-02-05 LAB — MAGNESIUM: Magnesium: 1.7 mg/dL (ref 1.7–2.4)

## 2021-02-05 LAB — IRON AND TIBC
Iron: 113 ug/dL (ref 28–170)
Saturation Ratios: 29 % (ref 10.4–31.8)
TIBC: 389 ug/dL (ref 250–450)
UIBC: 276 ug/dL

## 2021-02-05 LAB — FERRITIN: Ferritin: 43 ng/mL (ref 11–307)

## 2021-02-05 MED ORDER — HEPARIN SOD (PORK) LOCK FLUSH 100 UNIT/ML IV SOLN
500.0000 [IU] | Freq: Once | INTRAVENOUS | Status: AC
  Administered 2021-02-05: 500 [IU] via INTRAVENOUS

## 2021-02-05 MED ORDER — SODIUM CHLORIDE 0.9% FLUSH
10.0000 mL | Freq: Once | INTRAVENOUS | Status: AC
  Administered 2021-02-05: 10 mL via INTRAVENOUS

## 2021-02-05 MED ORDER — OXYCODONE HCL 10 MG PO TABS: 10.0000 mg | ORAL_TABLET | ORAL | 0 refills | Status: DC | PRN

## 2021-02-05 MED ORDER — ONDANSETRON HCL 4 MG PO TABS: 4.0000 mg | ORAL_TABLET | Freq: Three times a day (TID) | ORAL | 3 refills | Status: DC | PRN

## 2021-02-05 NOTE — Patient Instructions (Signed)
[facility]  Discharge Instructions: Thank you for choosing [facility] to provide your oncology and hematology care.  If you have a lab appointment with the Cancer Center, please come in thru the Main Entrance and check in at the main information desk.  Wear comfortable clothing and clothing appropriate for easy access to any Portacath or PICC line.   We strive to give you quality time with your provider. You may need to reschedule your appointment if you arrive late (15 or more minutes).  Arriving late affects you and other patients whose appointments are after yours.  Also, if you miss three or more appointments without notifying the office, you may be dismissed from the clinic at the provider's discretion.      For prescription refill requests, have your pharmacy contact our office and allow 72 hours for refills to be completed.        To help prevent nausea and vomiting after your treatment, we encourage you to take your nausea medication as directed.  BELOW ARE SYMPTOMS THAT SHOULD BE REPORTED IMMEDIATELY: *FEVER GREATER THAN 100.4 F (38 C) OR HIGHER *CHILLS OR SWEATING *NAUSEA AND VOMITING THAT IS NOT CONTROLLED WITH YOUR NAUSEA MEDICATION *UNUSUAL SHORTNESS OF BREATH *UNUSUAL BRUISING OR BLEEDING *URINARY PROBLEMS (pain or burning when urinating, or frequent urination) *BOWEL PROBLEMS (unusual diarrhea, constipation, pain near the anus) TENDERNESS IN MOUTH AND THROAT WITH OR WITHOUT PRESENCE OF ULCERS (sore throat, sores in mouth, or a toothache) UNUSUAL RASH, SWELLING OR PAIN  UNUSUAL VAGINAL DISCHARGE OR ITCHING   Items with * indicate a potential emergency and should be followed up as soon as possible or go to the Emergency Department if any problems should occur.  Please show the CHEMOTHERAPY ALERT CARD or IMMUNOTHERAPY ALERT CARD at check-in to the Emergency Department and triage nurse.  Should you have questions after your visit or need to cancel  or reschedule your appointment, please contact [facility] [phone]  and follow the prompts.  Office hours are 8:00 a.m. to 4:30 p.m. Monday - Friday. Please note that voicemails left after 4:00 p.m. may not be returned until the following business day.  We are closed weekends and major holidays. You have access to a nurse at all times for urgent questions. Please call the main number to the clinic [phone] and follow the prompts.  For any non-urgent questions, you may also contact your provider using MyChart. We now offer e-Visits for anyone 18 and older to request care online for non-urgent symptoms. For details visit [email or web address].   Also download the MyChart app! Go to the app store, search "MyChart", open the app, select [facility], and log in with your MyChart username and password.  Due to Covid, a mask is required upon entering the hospital/clinic. If you do not have a mask, one will be given to you upon arrival. For doctor visits, patients may have 1 support person aged 18 or older with them. For treatment visits, patients cannot have anyone with them due to current Covid guidelines and our immunocompromised population.  

## 2021-02-05 NOTE — Progress Notes (Signed)
Labs drawn from port for oncology follow up visit.  Patients port flushed without difficulty.  Good blood return noted with no bruising or swelling noted at site.  Band aid applied.  VSS with discharge and left in satisfactory condition with no s/s of distress noted.

## 2021-02-06 ENCOUNTER — Encounter (HOSPITAL_COMMUNITY): Payer: Self-pay | Admitting: Hematology

## 2021-02-06 ENCOUNTER — Inpatient Hospital Stay (HOSPITAL_BASED_OUTPATIENT_CLINIC_OR_DEPARTMENT_OTHER): Payer: Medicare Other | Admitting: Hematology

## 2021-02-06 VITALS — BP 139/79 | HR 91 | Temp 98.3°F | Ht 64.0 in | Wt 180.0 lb

## 2021-02-06 DIAGNOSIS — C563 Malignant neoplasm of bilateral ovaries: Secondary | ICD-10-CM | POA: Diagnosis not present

## 2021-02-06 LAB — CA 125: Cancer Antigen (CA) 125: 288 U/mL — ABNORMAL HIGH (ref 0.0–38.1)

## 2021-02-06 NOTE — Progress Notes (Signed)
DISCONTINUE ON PATHWAY REGIMEN - Ovarian ° ° °  A cycle is every 21 days: °    Paclitaxel  °    Carboplatin  ° °**Always confirm dose/schedule in your pharmacy ordering system** ° °REASON: Disease Progression °PRIOR TREATMENT: OVOS44: Carboplatin AUC=6 + Paclitaxel 175 mg/m2 q21 Days x 2-4 Cycles °TREATMENT RESPONSE: Progressive Disease (PD) ° °START ON PATHWAY REGIMEN - Ovarian ° ° °  A cycle is every 21 days: °    Paclitaxel  °    Carboplatin  ° °**Always confirm dose/schedule in your pharmacy ordering system** ° °Patient Characteristics: °Recurrent or Progressive Disease, Second Line, Platinum Sensitive and ? 6 Months Since Last Therapy, Not a Candidate for Secondary Debulking Surgery °BRCA Mutation Status: Absent °Therapeutic Status: Recurrent or Progressive Disease °Line of Therapy: Second Line ° °Intent of Therapy: °Non-Curative / Palliative Intent, Discussed with Patient °

## 2021-02-06 NOTE — Patient Instructions (Signed)
[place] at [facility] Discharge Instructions  You were seen and examined today by Dr. Delton Coombes.  Your iron is low and your tumor marker is elevated. Dr. Delton Coombes has recommended iron infusions and a repeat CT scan. Following the CT scan, it is recommended that you resume chemotherapy treatment.  It will be the same chemotherapy treatments that you previously have. You will get 4 to 6 cycles of treatment followed by maintenance therapy in the form of a pill.  Follow-up as scheduled for iron.   Thank you for choosing [place] at [facility] to provide your oncology and hematology care.  To afford each patient quality time with our provider, please arrive at least 15 minutes before your scheduled appointment time.   If you have a lab appointment with the [place] please come in thru the Main Entrance and check in at the main information desk.  You need to re-schedule your appointment should you arrive 10 or more minutes late.  We strive to give you quality time with our providers, and arriving late affects you and other patients whose appointments are after yours.  Also, if you no show three or more times for appointments you may be dismissed from the clinic at the providers discretion.     Again, thank you for choosing [facility].  Our hope is that these requests will decrease the amount of time that you wait before being seen by our physicians.       _____________________________________________________________  Should you have questions after your visit to [facility], please contact our office at [phone] and follow the prompts.  Our office hours are 8:00 a.m. and 4:30 p.m. Monday - Friday.  Please note that voicemails left after 4:00 p.m. may not be returned until the following business day.  We are closed weekends and major holidays.  You do have access to a nurse 24-7, just call the main number to  the clinic [phone] and do not press any options, hold on the line and a nurse will answer the phone.    For prescription refill requests, have your pharmacy contact our office and allow 72 hours.    Due to Covid, you will need to wear a mask upon entering the hospital. If you do not have a mask, a mask will be given to you at the Main Entrance upon arrival. For doctor visits, patients may have 1 support person age 45 or older with them. For treatment visits, patients can not have anyone with them due to social distancing guidelines and our immunocompromised population.

## 2021-02-06 NOTE — Progress Notes (Signed)
[name],  [id number]   CLINIC:  Medical Oncology/Hematology  PCP:  [name], MD [address] / [place] [address] [phone]   REASON FOR VISIT:  Follow-up for high-grade serous ovarian cancer  PRIOR THERAPY:  1. Carboplatin and paclitaxel x 7 cycles from 08/24/2018 to 03/15/2019. 2. Laparoscopic TAH & BSO & omenectomy on 12/24/2018.  NGS Results: not done  CURRENT THERAPY: surveillance  BRIEF ONCOLOGIC HISTORY:  Oncology History  Ovarian cancer, bilateral ([place])  07/07/2018 Pathology Results   PLEURAL FLUID, LEFT (SPECIMEN 1 OF 1, COLLECTED 07/07/18): - MALIGNANT CELLS CONSISTENT WITH ADENOCARCINOMA - SEE COMMENT  Source Pleural Fluid, (Specimen 1 of 1, collected on 07/07/2018) Gross Specimen: Received is/are 1000cc of bloody red fluid with tissue. (TC:tc) Prepared: # Smears: 0 # Concentration Technique Slides (i.e. ThinPrep): 1 # Cell Block: 1 Conventional Additional Studies: Two Hematology slides labeled [id number] Comment The malignant cells are positive for cytokeratin 7, p53, WT-1, Pax-8, Moc31, ER (weak) and EMA but negative for cytokeratin 20, TTF-1, GATA-3, CDX-2 and D2-40. Overall, the phenotype is consistent with a gynecologic primary; clinical correlation recommended.   07/07/2018 Procedure   Successful ultrasound guided left thoracentesis yielding 2.0 L of pleural fluid   07/08/2018 Procedure   1. Technically successful placement of left 14 French pigtail chest drain, placed to Pleur-evac water-seal.   07/08/2018 Procedure   1. Technically successful five [place] double lumen power injectable PICC placement   07/09/2018 Imaging   Ct chest 1. There is a moderate, loculated left hydropneumothorax with a small air component and moderate fluid component. The largest loculated component is located posteriorly. There is a pigtail drainage catheter about the lateral pleural space. There is no obvious etiology, such as  obvious mass or pleural disease.   2. There is a small right pleural effusion with associated atelectasis or consolidation and a subpleural consolidation of the superior segment right lower lobe (series 4, image 56), of uncertain significance, possibly infectious or inflammatory   07/10/2018 Imaging   Ct abdomen and pelvis: 1. The bilateral ovaries are enlarged by heterogeneous appearing cystic lesions, measuring 5.3 x 4.2 cm on the right (series 4, image 72) and 4.5 x 3.2 cm on the left (series 4, image 75). Consider dedicated pelvic ultrasound and/or pelvic MRI to further evaluate for solid components given high suspicion for GYN primary malignancy.   2. No other evidence of mass and no lymphadenopathy in the abdomen or pelvis.   3. Trace ascites. There is some suggestion of omental and peritoneal nodularity (e.g. Series 4, image 61), concerning for peritoneal metastatic disease.    4. Loculated left-sided pleural effusion with left-sided pleural drainage catheter in position. Small right pleural effusion   07/13/2018 Surgery   OPERATION: 1.  Left VATS (video-assisted thoracoscopic surgery) for drainage of loculated pleural effusion. 2.  Talc pleurodesis for malignant pleural effusion. 3.  Placement of PleurX catheter for management of malignant pleural effusion. 4.  Placement of On-Q analgesia catheter system.    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   07/13/2018 Pathology Results   Pleura, peel, Left Pleural - FIBRO-FIBRINOUS PLEURITIS - NEGATIVE FOR MALIGNANCY   07/18/2018 Initial Diagnosis   Ovarian cancer, bilateral ([facility])   07/20/2018 Procedure   EGD impression: Normal proximal esophagus and mid esophagus. Mild distal  esophageal rings; dilation not performed because of esophagitis. LA Grade  C reflux esophagitis. Z-line regular, 30 cm from the incisors. 5 cm hiatal hernia. Non-bleeding gastric ulcer with no stigmata of bleeding. Gastritis. Duodenal erosions without bleeding. Normal second portion of the duodenum. No specimens collected.   08/19/2018 Genetic Testing   Negative genetic testing on the common hereditary cancer panel.  The Common Hereditary Gene Panel offered by [facility] includes sequencing and/or deletion duplication testing of the following 48 genes: APC, ATM, AXIN2, BARD1, BMPR1A, BRCA1, BRCA2, BRIP1, CDH1, CDK4, CDKN2A (p14ARF), CDKN2A (p16INK4a), CHEK2, CTNNA1, DICER1, EPCAM (Deletion/duplication testing only), GREM1 (promoter region deletion/duplication testing only), KIT, MEN1, MLH1, MSH2, MSH3, MSH6, MUTYH, NBN, NF1, NHTL1, PALB2, PDGFRA, PMS2, POLD1, POLE, PTEN, RAD50, RAD51C, RAD51D, RNF43, SDHB, SDHC, SDHD, SMAD4, SMARCA4. STK11, TP53, TSC1, TSC2, and VHL.  The following genes were evaluated for sequence changes only: SDHA and HOXB13 c.251G>A variant only. The report date is August 19, 2018.   08/24/2018 -  Chemotherapy   The patient had palonosetron (ALOXI) injection 0.25 mg, 0.25 mg, Intravenous,  Once, 7 of 7 cycles Administration: 0.25 mg (08/24/2018), 0.25 mg (09/14/2018), 0.25 mg (10/05/2018), 0.25 mg (12/01/2018), 0.25 mg (01/27/2019), 0.25 mg (02/22/2019), 0.25 mg (03/15/2019) CARBOplatin (PARAPLATIN) 740 mg in sodium chloride 0.9 % 250 mL chemo infusion, 740 mg (100 % of original dose 742.8 mg), Intravenous,  Once, 7 of 7 cycles Dose modification:   (original dose 742.8 mg, Cycle 1),   (original dose 736.8 mg, Cycle 2),   (original dose 736.8 mg, Cycle 3),   (original dose 736.8 mg, Cycle 4),   (original dose 736.8 mg, Cycle 5),   (original dose 736.8 mg, Cycle 6),   (original dose 736.8 mg, Cycle 7) Administration: 740 mg (08/24/2018), 740 mg (09/14/2018), 740 mg (10/05/2018), 740 mg (12/01/2018), 740 mg (01/27/2019), 740 mg (02/22/2019), 740 mg (03/15/2019) PACLitaxel  (TAXOL) 324 mg in sodium chloride 0.9 % 500 mL chemo infusion (> 99m/m2), 175 mg/m2 = 324 mg, Intravenous,  Once, 7 of 7 cycles Administration: 324 mg (08/24/2018), 324 mg (09/14/2018), 324 mg (10/05/2018), 324 mg (12/01/2018), 324 mg (01/27/2019), 324 mg (02/22/2019), 324 mg (03/15/2019) fosaprepitant (EMEND) 150 mg, dexamethasone (DECADRON) 12 mg in sodium chloride 0.9 % 145 mL IVPB, , Intravenous,  Once, 7 of 7 cycles Administration:  (08/24/2018),  (09/14/2018),  (10/05/2018),  (12/01/2018),  (01/27/2019),  (02/22/2019),  (03/15/2019)   for chemotherapy treatment.       CANCER STAGING:  Cancer Staging  No matching staging information was found for the patient.  INTERVAL HISTORY:  [name] a 51y.o. female, returns for routine follow-up of her high-grade serous ovarian cancer. AHamdiwas last seen on 12/14/2020.   Today she reports feeling fair. She reports pain in her lower back and in the left side of her abdomen. She also reports worsening abdominal cramping throughout the day and night. She reports constant fatigue. She reports her hiatal hernia feels irritated following eating; she feels as if food "gets stuck". She reports neuropathy bilaterally in her hands. Her nausea has worsened after eating.   REVIEW OF SYSTEMS:  Review of Systems  Constitutional:  Positive for appetite change and fatigue.  HENT:   Positive for trouble swallowing.   Gastrointestinal:  Positive for abdominal pain (L side), diarrhea and nausea.  Musculoskeletal:  Positive for back pain (6/10 lower).  Neurological:  Positive for headaches and numbness (neuropathy in hands).  All other systems reviewed and are negative.  PAST MEDICAL/SURGICAL HISTORY:  Past Medical History:  Diagnosis Date   Anemia    Anxiety and depression    Arthritis of facet joints at multiple vertebral levels    L5-S1   Constipation    Dyslipidemia    Family history of breast cancer    Family history of uterine cancer    GERD  (gastroesophageal reflux disease)    History of hiatal hernia    History of kidney stones    Insomnia    Irritable bowel syndrome    Migraine    Muscle tension headache    Neuropathy of finger    Ovarian carcinoma ([facility])    ovarian   Plantar fasciitis of right foot    Port-A-Cath in place 08/20/2018   Past Surgical History:  Procedure Laterality Date   ABDOMINAL HYSTERECTOMY     BIOPSY  10/27/2020   Procedure: BIOPSY;  Surgeon: Harvel Quale, MD;  [facility];  Service: Gastroenterology;;   CHOLECYSTECTOMY  2008   COLONOSCOPY N/A 08/13/2013   Procedure: COLONOSCOPY;  Surgeon: Rogene Houston, MD;  [facility];  Service: Endoscopy;  Laterality: N/A;  230-moved to 145 Ann to notify pt   COLONOSCOPY WITH PROPOFOL N/A 11/15/2020   Procedure: COLONOSCOPY WITH PROPOFOL;  Surgeon: Rogene Houston, MD;  [facility];  Service: Endoscopy;  Laterality: N/A;  1:40   ESOPHAGOGASTRODUODENOSCOPY     ESOPHAGOGASTRODUODENOSCOPY (EGD) WITH PROPOFOL N/A 07/20/2018   Procedure: ESOPHAGOGASTRODUODENOSCOPY (EGD) WITH PROPOFOL;  Surgeon: Rogene Houston, MD;  [facility];  Service: Endoscopy;  Laterality: N/A;  Possible esophageal dilation.   ESOPHAGOGASTRODUODENOSCOPY (EGD) WITH PROPOFOL N/A 10/27/2020   Procedure: ESOPHAGOGASTRODUODENOSCOPY (EGD) WITH PROPOFOL;  Surgeon: Harvel Quale, MD;  [facility];  Service: Gastroenterology;  Laterality: N/A;  2:10, pt knows to arrive at 10:15   [place] 11/24/2020   Procedure: [place];  Surgeon: Rogene Houston, MD;  [facility];  Service: Endoscopy;  Laterality: N/A;  7:30   IR ANGIOGRAM SELECTIVE EACH ADDITIONAL VESSEL  08/01/2018   IR ANGIOGRAM VISCERAL SELECTIVE  08/01/2018   IR EMBO ART  VEN HEMORR LYMPH EXTRAV  INC GUIDE ROADMAPPING  08/01/2018   IR IMAGING GUIDED PORT INSERTION  08/20/2018   IR PERC PLEURAL DRAIN W/INDWELL CATH W/IMG GUIDE   07/08/2018   IR THORACENTESIS ASP PLEURAL SPACE W/IMG GUIDE  07/07/2018   IR US GUIDE VASC ACCESS RIGHT  08/01/2018   PLEURAL EFFUSION DRAINAGE Left 07/13/2018   Procedure: DRAINAGE OF LOCULATED PLEURAL EFFUSION;  Surgeon: Ivin Poot, MD;  Location: [facility];  Service: Thoracic;  Laterality: Left;   POLYPECTOMY  11/15/2020   Procedure: POLYPECTOMY;  Surgeon: Rogene Houston, MD;  [facility];  Service: Endoscopy;;   REMOVAL OF PLEURAL DRAINAGE CATHETER Left 08/20/2018   Procedure: REMOVAL OF PLEURAL DRAINAGE CATHETER;  Surgeon: Ivin Poot, MD;  Location: [facility];  Service: Thoracic;  Laterality: Left;   REMOVAL OF PLEURAL DRAINAGE CATHETER Left 08/20/2018   Procedure: REMOVAL OF PLEURAL DRAINAGE CATHETER;  Surgeon: Ivin Poot, MD;  Location: [facility];  Service: Thoracic;  Laterality: Left;   TALC PLEURODESIS Left 07/13/2018   Procedure: Talc Pleuradesis;  Surgeon: Prescott Gum, Collier Salina, MD;  Location: [facility];  Service: Thoracic;  Laterality: Left;   TOTAL HIP ARTHROPLASTY Left 06/05/2020   Procedure: LEFT TOTAL HIP ARTHROPLASTY ANTERIOR APPROACH;  Surgeon: Leandrew Koyanagi, MD;  Location: [facility];  Service: Orthopedics;  Laterality: Left;  3-C   TUBAL LIGATION Bilateral    UTERINE ABLATION     VIDEO ASSISTED THORACOSCOPY Left 07/13/2018   Procedure: VIDEO ASSISTED THORACOSCOPY;  Surgeon: Ivin Poot, MD;  Location: [facility];  Service: Thoracic;  Laterality: Left;    SOCIAL HISTORY:  Social History   Socioeconomic History   Marital status: Widowed    Spouse name: Not on file   Number of children: 2   Years of education: 2-College   Highest education level: Not on file  Occupational History    Employer: [employer]  Tobacco Use   Smoking status: Former    Packs/day: 0.50    Years: 17.00    Pack years: 8.50    Types: Cigarettes    Quit date: 06/22/2018    Years since quitting: 2.6   Smokeless tobacco: Never  Vaping Use   Vaping Use: Never used  Substance and Sexual Activity    Alcohol use: Yes    Alcohol/week: 1.0 standard drink    Types: 1 Glasses of wine per week    Comment: Drinks alcohol on occasion   Drug use: No   Sexual activity: Not on file  Other Topics Concern   Not on file  Social History Narrative   Patient lives at home with her daughter.    Patient has 2 children.    Patient is widowed.    Patient is right handed.    Patient has her Associates degree.      Social Determinants of Health   Financial Resource Strain: Not on file  Food Insecurity: Not on file  Transportation Needs: Not on file  Physical Activity: Not on file  Stress: Not on file  Social Connections: Not on file  Intimate Partner Violence: Not on file    FAMILY HISTORY:  Family History  Problem Relation Age of Onset   Hypertension Mother    Obesity Mother    Diabetes Mother    Kidney disease Mother    Peripheral vascular disease Father    Atrial fibrillation Father    COPD Brother    Osteoporosis Brother    Crohn's disease Sister    Uterine cancer Sister 77       maternal half sister   Breast cancer Maternal Aunt 90   Colon cancer Neg Hx     CURRENT MEDICATIONS:  Current Outpatient Medications  Medication Sig Dispense Refill   Ascorbic Acid (VITAMIN C) 1000 MG tablet Take 2,000 mg by mouth daily.     B Complex-C (B-COMPLEX WITH VITAMIN C) tablet Take 1 tablet by mouth daily.     Cholecalciferol (VITAMIN D) 50 MCG (2000 UT) CAPS Take 2,000 Units by mouth daily.     diazepam (VALIUM) 5 MG tablet TAKE ONE TABLET BY MOUTH AT BEDTIME 30 tablet 5   gabapentin (NEURONTIN) 100 MG capsule TAKE TWO (2) CAPSULES BY MOUTH AT BEDTIME 60 capsule 2   levofloxacin (LEVAQUIN) 750 MG tablet Take 1 tablet (750 mg total) by mouth daily. 5 tablet 0   lidocaine-prilocaine (EMLA) cream Apply small amount to port a cath site and cover with plastic wrap one hour prior to chemotherapy appointments 30 g 3   Magnesium 500 MG TABS Take 500 mg by mouth in the morning.     omeprazole  (PRILOSEC) 40 MG capsule Take 1 capsule (40 mg total) by mouth 2 (two) times daily. 180 capsule 3   SUMAtriptan (IMITREX) 100 MG tablet TAKE ONE TABLET BY MOUTH PRN UP to TWICE  DAILY AS NEEDED. 9 tablet 5   venlafaxine XR (EFFEXOR-XR) 150 MG 24 hr capsule TAKE ONE CAPSULE BY MOUTH DAILY WITH BREAKFAST 30 capsule 6   albuterol (VENTOLIN HFA) 108 (90 Base) MCG/ACT inhaler Inhale 2 puffs into the lungs every 6 (six) hours as needed for wheezing or shortness of breath. (Patient not taking: Reported on 02/06/2021)     docusate sodium (COLACE) 100 MG capsule Take 1 capsule (100 mg total) by mouth daily as needed. To be taken after surgery (Patient not taking: Reported on 02/06/2021) 30 capsule 2   ondansetron (ZOFRAN) 4 MG tablet Take 1 tablet (4 mg total) by mouth every 8 (eight) hours as needed for nausea or vomiting. (Patient not taking: Reported on 02/06/2021) 40 tablet 3   Oxycodone HCl 10 MG TABS Take 1 tablet (10 mg total) by mouth every 4 (four) hours as needed. (Patient not taking: Reported on 02/06/2021) 168 tablet 0   sennosides-docusate sodium (SENOKOT-S) 8.6-50 MG tablet Take 1 tablet by mouth daily as needed for constipation. (Patient not taking: Reported on 02/06/2021)     tiZANidine (ZANAFLEX) 4 MG tablet Take 1 tablet (4 mg total) by mouth 3 (three) times daily as needed for muscle spasms. (Patient not taking: Reported on 02/06/2021) 60 tablet 0   triamcinolone cream (KENALOG) 0.1 % Apply 1 application topically 2 (two) times daily as needed (rash). (Patient not taking: Reported on 02/06/2021) 80 g 11   No current facility-administered medications for this visit.   Facility-Administered Medications Ordered in Other Visits  Medication Dose Route Frequency Provider Last Rate Last Admin   0.9 %  sodium chloride infusion   Intravenous Continuous Derek Jack, MD   Stopped at 10/23/20 1547   sodium chloride flush (NS) 0.9 % injection 10 mL  10 mL Intravenous PRN Derek Jack, MD   10  mL at 10/23/20 1544    ALLERGIES:  Allergies  Allergen Reactions   Morphine And Related Itching   Nickel Itching   Nortriptyline Other (See Comments)    Significant weight gain   Topamax [Topiramate] Diarrhea and Nausea Only   Xanax [Alprazolam]     Can't wake up    Actifed Cold-Allergy [Chlorpheniramine-Phenyleph Er] Rash   Amoxicillin Rash    Did it involve swelling of the face/tongue/throat, SOB, or low BP? Unknown Did it involve sudden or severe rash/hives, skin peeling, or any reaction on the inside of your mouth or nose? Unknown Did you need to seek medical attention at a hospital or doctor's office? Unknown When did it last happen? teenager       If all above answers are "NO", may proceed with cephalosporin use.    Codeine Hives   Erythromycin Rash   Penicillins Rash    Did it involve swelling of the face/tongue/throat, SOB, or low BP? Unknown Did it involve sudden or severe rash/hives, skin peeling, or any reaction on the inside of your mouth or nose? Unknown Did you need to seek medical attention at a hospital or doctor's office? Unknown When did it last happen? teenager       If all above answers are "NO", may proceed with cephalosporin use.    Sudafed [Pseudoephedrine Hcl] Rash    PHYSICAL EXAM:  Performance status (ECOG): 1 - Symptomatic but completely ambulatory  Vitals:   02/06/21 1520  BP: 139/79  Pulse: 91  Temp: 98.3 F (36.8 C)  SpO2: 97%   Wt Readings from Last 3 Encounters:  02/06/21 180 lb (81.6 kg)  01/10/21 185 lb 6.4 oz (84.1 kg)  12/14/20 181 lb 14.1 oz (82.5 kg)   Physical Exam Vitals reviewed.  Constitutional:      Appearance: Normal appearance.  Cardiovascular:     Rate and Rhythm: Normal rate and regular rhythm.     Pulses: Normal pulses.     Heart sounds: Normal heart sounds.  Pulmonary:     Effort: Pulmonary effort is normal.     Breath sounds: Normal breath sounds.  Abdominal:     Palpations: Abdomen is soft. There is no  hepatomegaly, splenomegaly or mass.     Tenderness: There is abdominal tenderness in the left upper quadrant and left lower quadrant.  Neurological:     General: No focal deficit present.     Mental Status: She is alert and oriented to person, place, and time.  Psychiatric:        Mood and Affect: Mood normal.        Behavior: Behavior normal.     LABORATORY DATA:  I have reviewed the labs as listed.  CBC Latest Ref Rng & Units 02/05/2021 12/12/2020 11/30/2020  WBC 4.0 - 10.5 K/uL 11.4(H) 6.3 -  Hemoglobin 12.0 - 15.0 g/dL 13.9 13.5 13.0  Hematocrit 36.0 - 46.0 % 41.6 41.4 38.7  Platelets 150 - 400 K/uL 386 327 -   CMP Latest Ref Rng & Units 02/05/2021 12/12/2020 11/16/2020  Glucose 70 - 99 mg/dL 97 115(H) 138(H)  BUN 6 - 20 mg/dL _0 Creatinine 0.44 - 1.00 mg/dL 0.60 0.68 0.70  Sodium 135 - 145 mmol/L 133(L) 137 141  Potassium 3.5 - 5.1 mmol/L 3.9 3.4(L) 3.7  Chloride 98 - 111 mmol/L 99 102 107  CO2 22 - 32 mmol/L _1 Calcium 8.9 - 10.3 mg/dL 8.7(L) 8.9 8.9  Total Protein 6.5 - 8.1 g/dL 7.0 7.3 6.1(L)  Total Bilirubin 0.3 - 1.2 mg/dL 0.5 0.5 0.5  Alkaline Phos 38 - 126 U/L 137(H) 116 102  AST 15 - 41 U/L 32 34 21  ALT 0 - 44 U/L _2 DIAGNOSTIC IMAGING:  I have independently reviewed the scans and discussed with the patient. No results found.   ASSESSMENT:  1.  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 -Robotic assisted laparoscopic TAH and BSO and omentectomy on 12/24/2018, pathology showing high-grade serous carcinoma, PT3P NX. -Germline mutation testing was negative. -3 more cycles of adjuvant chemotherapy completed on 03/15/2019. -CTAP on 04/13/2019 showed no findings of active malignancy.  28% reduction in the volume of presumed chronic hematoma/chronic fluid collection splaying the upper margin of the spleen.  Large type III  hiatal hernia. -CTAP on 10/13/2019 shows no findings of recurrence or metastatic disease. - Foundation 1 shows HRD+, LOH score>16%.  MSI-stable.  MYC amplification.  T p53 mutation.   PLAN:  1.  Clinical stage IVa high-grade serous ovarian cancer, positive cytology of left pleural effusion: - CTAP from 12/06/2020 showed recurrence of her cancer. - She also reports worsening cramping mainly in the lower quadrants since last visit.  Low back pain has also been worse in the last 2 months. - She reports nausea mostly in the mornings and after eating. - She met with Dr. Berline Lopes on 01/10/2021 and discussed various options.  She was felt to be not an optimal candidate for surgery at this time.  We discussed observation versus chemotherapy  initiation.  As her symptoms are worsening, we decided to proceed with restarting carboplatin and paclitaxel.  We will consider olaparib maintenance after 6 cycles. - Recommend baseline CT AP with contrast. - We have reviewed labs from 02/05/2021.  CA125 has increased to 288 from 87 in November.  LFTs are within normal limits except elevated alk phos of 137.   2.  Lower back/left upper quadrant pain: - Continue oxycodone 10 mg up to 4 to 6 tablets/day.   3.  Peripheral neuropathy: - She reports numbness in the first 3 fingers of both hands predominantly at nighttime. - Continue gabapentin at bedtime which is helping.  4.  Iron deficiency state: - She previously had EGD, colonoscopy and capsule study which were negative. - Her ferritin is 43 and percent saturation is 29.  Ferritin is trending down even though hemoglobin is normal.  She complains of severe fatigue.  I have recommended Feraheme weekly x2.   Orders placed this encounter:  Orders Placed This Encounter  Procedures   CT Abdomen Pelvis W Contrast   CBC with Differential   Comprehensive metabolic panel   Magnesium   [place], MD [place] [phone]   I,  Thana Ates, am acting as a scribe for Dr. Derek Jack.  I, Derek Jack MD, have reviewed the above documentation for accuracy and completeness, and I agree with the above.

## 2021-02-06 NOTE — Progress Notes (Signed)
ON PATHWAY REGIMEN - Ovarian  No Change  Continue With Treatment as Ordered.  Original Decision Date/Time: 08/19/2018 12:26     A cycle is every 21 days:     Paclitaxel      Carboplatin   **Always confirm dose/schedule in your pharmacy ordering system**  Patient Characteristics: Preoperative or Nonsurgical Candidate (Clinical Staging), Newly Diagnosed, Neoadjuvant Therapy followed by Surgery Therapeutic Status: Preoperative or Nonsurgical Candidate (Clinical Staging) BRCA Mutation Status: Awaiting Test Results AJCC T Category: cTX AJCC 8 Stage Grouping: IVA AJCC N Category: cNX AJCC M Category: pM1a Therapy Plan: Neoadjuvant Therapy followed by Surgery Intent of Therapy: Non-Curative / Palliative Intent, Discussed with Patient

## 2021-02-12 ENCOUNTER — Inpatient Hospital Stay (HOSPITAL_COMMUNITY): Payer: Medicare Other

## 2021-02-12 ENCOUNTER — Other Ambulatory Visit: Payer: Self-pay

## 2021-02-12 VITALS — BP 96/65 | HR 77 | Temp 97.6°F | Resp 18 | Ht 64.0 in

## 2021-02-12 DIAGNOSIS — K921 Melena: Secondary | ICD-10-CM

## 2021-02-12 DIAGNOSIS — C563 Malignant neoplasm of bilateral ovaries: Secondary | ICD-10-CM

## 2021-02-12 MED ORDER — HEPARIN SOD (PORK) LOCK FLUSH 100 UNIT/ML IV SOLN
250.0000 [IU] | Freq: Once | INTRAVENOUS | Status: AC | PRN
  Administered 2021-02-12: 250 [IU]

## 2021-02-12 MED ORDER — FAMOTIDINE 20 MG PO TABS
20.0000 mg | ORAL_TABLET | Freq: Once | ORAL | Status: AC
  Administered 2021-02-12: 20 mg via ORAL
  Filled 2021-02-12: qty 1

## 2021-02-12 MED ORDER — SODIUM CHLORIDE 0.9 % IV SOLN: Freq: Once | INTRAVENOUS | Status: AC

## 2021-02-12 MED ORDER — SODIUM CHLORIDE 0.9% FLUSH
3.0000 mL | Freq: Once | INTRAVENOUS | Status: AC | PRN
  Administered 2021-02-12: 3 mL

## 2021-02-12 MED ORDER — LORATADINE 10 MG PO TABS
10.0000 mg | ORAL_TABLET | Freq: Once | ORAL | Status: AC
  Administered 2021-02-12: 10 mg via ORAL
  Filled 2021-02-12: qty 1

## 2021-02-12 MED ORDER — SODIUM CHLORIDE 0.9 % IV SOLN
510.0000 mg | Freq: Once | INTRAVENOUS | Status: AC
  Administered 2021-02-12: 510 mg via INTRAVENOUS
  Filled 2021-02-12: qty 510

## 2021-02-12 NOTE — Patient Instructions (Signed)
[facility]  Discharge Instructions: Thank you for choosing [facility] to provide your oncology and hematology care.  If you have a lab appointment with the Cancer Center, please come in thru the Main Entrance and check in at the main information desk.  Wear comfortable clothing and clothing appropriate for easy access to any Portacath or PICC line.   We strive to give you quality time with your provider. You may need to reschedule your appointment if you arrive late (15 or more minutes).  Arriving late affects you and other patients whose appointments are after yours.  Also, if you miss three or more appointments without notifying the office, you may be dismissed from the clinic at the provider's discretion.      For prescription refill requests, have your pharmacy contact our office and allow 72 hours for refills to be completed.        To help prevent nausea and vomiting after your treatment, we encourage you to take your nausea medication as directed.  BELOW ARE SYMPTOMS THAT SHOULD BE REPORTED IMMEDIATELY: *FEVER GREATER THAN 100.4 F (38 C) OR HIGHER *CHILLS OR SWEATING *NAUSEA AND VOMITING THAT IS NOT CONTROLLED WITH YOUR NAUSEA MEDICATION *UNUSUAL SHORTNESS OF BREATH *UNUSUAL BRUISING OR BLEEDING *URINARY PROBLEMS (pain or burning when urinating, or frequent urination) *BOWEL PROBLEMS (unusual diarrhea, constipation, pain near the anus) TENDERNESS IN MOUTH AND THROAT WITH OR WITHOUT PRESENCE OF ULCERS (sore throat, sores in mouth, or a toothache) UNUSUAL RASH, SWELLING OR PAIN  UNUSUAL VAGINAL DISCHARGE OR ITCHING   Items with * indicate a potential emergency and should be followed up as soon as possible or go to the Emergency Department if any problems should occur.  Please show the CHEMOTHERAPY ALERT CARD or IMMUNOTHERAPY ALERT CARD at check-in to the Emergency Department and triage nurse.  Should you have questions after your visit or need to cancel  or reschedule your appointment, please contact [facility] [phone]  and follow the prompts.  Office hours are 8:00 a.m. to 4:30 p.m. Monday - Friday. Please note that voicemails left after 4:00 p.m. may not be returned until the following business day.  We are closed weekends and major holidays. You have access to a nurse at all times for urgent questions. Please call the main number to the clinic [phone] and follow the prompts.  For any non-urgent questions, you may also contact your provider using MyChart. We now offer e-Visits for anyone 18 and older to request care online for non-urgent symptoms. For details visit [email or web address].   Also download the MyChart app! Go to the app store, search "MyChart", open the app, select [facility], and log in with your MyChart username and password.  Due to Covid, a mask is required upon entering the hospital/clinic. If you do not have a mask, one will be given to you upon arrival. For doctor visits, patients may have 1 support person aged 18 or older with them. For treatment visits, patients cannot have anyone with them due to current Covid guidelines and our immunocompromised population.  

## 2021-02-12 NOTE — Progress Notes (Signed)
Patient presents today for iron infusion.  Patient is in satisfactory condition with only complaints of dark stools.  MD notified.  Verbal order from Dr. Delton Coombes to give patient stool cards and have her return them as soon as possible.  No other new complaints from patient.  Vital signs are stable. We will proceed with infusion per MD orders.  Patient tolerated treatment well with no complaints voiced.  Patient left ambulatory in stable condition.  Vital signs stable at discharge.  Follow up as scheduled.

## 2021-02-15 ENCOUNTER — Encounter (HOSPITAL_COMMUNITY): Payer: Self-pay

## 2021-02-15 NOTE — Progress Notes (Signed)
VM received from patient requesting a call back. Attempted to call patient back. Unable to reach patient, VM box full at this time.

## 2021-02-19 ENCOUNTER — Other Ambulatory Visit: Payer: Self-pay

## 2021-02-19 ENCOUNTER — Encounter (HOSPITAL_COMMUNITY): Payer: Self-pay

## 2021-02-19 ENCOUNTER — Inpatient Hospital Stay (HOSPITAL_COMMUNITY): Payer: Medicare Other | Attending: Hematology

## 2021-02-19 VITALS — BP 110/69 | HR 90 | Temp 97.6°F | Resp 18

## 2021-02-19 DIAGNOSIS — K3189 Other diseases of stomach and duodenum: Secondary | ICD-10-CM | POA: Diagnosis not present

## 2021-02-19 DIAGNOSIS — Z5111 Encounter for antineoplastic chemotherapy: Secondary | ICD-10-CM | POA: Insufficient documentation

## 2021-02-19 DIAGNOSIS — R112 Nausea with vomiting, unspecified: Secondary | ICD-10-CM | POA: Insufficient documentation

## 2021-02-19 DIAGNOSIS — E611 Iron deficiency: Secondary | ICD-10-CM | POA: Insufficient documentation

## 2021-02-19 DIAGNOSIS — C563 Malignant neoplasm of bilateral ovaries: Secondary | ICD-10-CM | POA: Insufficient documentation

## 2021-02-19 DIAGNOSIS — K921 Melena: Secondary | ICD-10-CM

## 2021-02-19 DIAGNOSIS — Z9079 Acquired absence of other genital organ(s): Secondary | ICD-10-CM | POA: Insufficient documentation

## 2021-02-19 DIAGNOSIS — R0602 Shortness of breath: Secondary | ICD-10-CM | POA: Insufficient documentation

## 2021-02-19 DIAGNOSIS — R32 Unspecified urinary incontinence: Secondary | ICD-10-CM | POA: Insufficient documentation

## 2021-02-19 DIAGNOSIS — G620 Drug-induced polyneuropathy: Secondary | ICD-10-CM | POA: Insufficient documentation

## 2021-02-19 DIAGNOSIS — K5900 Constipation, unspecified: Secondary | ICD-10-CM | POA: Insufficient documentation

## 2021-02-19 DIAGNOSIS — R109 Unspecified abdominal pain: Secondary | ICD-10-CM | POA: Diagnosis not present

## 2021-02-19 DIAGNOSIS — Z79899 Other long term (current) drug therapy: Secondary | ICD-10-CM | POA: Diagnosis not present

## 2021-02-19 DIAGNOSIS — J910 Malignant pleural effusion: Secondary | ICD-10-CM | POA: Diagnosis not present

## 2021-02-19 DIAGNOSIS — Z90722 Acquired absence of ovaries, bilateral: Secondary | ICD-10-CM | POA: Diagnosis not present

## 2021-02-19 DIAGNOSIS — Z87891 Personal history of nicotine dependence: Secondary | ICD-10-CM | POA: Diagnosis not present

## 2021-02-19 DIAGNOSIS — C786 Secondary malignant neoplasm of retroperitoneum and peritoneum: Secondary | ICD-10-CM | POA: Diagnosis not present

## 2021-02-19 DIAGNOSIS — T451X5A Adverse effect of antineoplastic and immunosuppressive drugs, initial encounter: Secondary | ICD-10-CM | POA: Insufficient documentation

## 2021-02-19 DIAGNOSIS — Z90710 Acquired absence of both cervix and uterus: Secondary | ICD-10-CM | POA: Insufficient documentation

## 2021-02-19 DIAGNOSIS — R21 Rash and other nonspecific skin eruption: Secondary | ICD-10-CM | POA: Diagnosis not present

## 2021-02-19 LAB — OCCULT BLOOD X 1 CARD TO LAB, STOOL
Fecal Occult Bld: NEGATIVE
Fecal Occult Bld: NEGATIVE
Fecal Occult Bld: POSITIVE — AB

## 2021-02-19 MED ORDER — HEPARIN SOD (PORK) LOCK FLUSH 100 UNIT/ML IV SOLN
500.0000 [IU] | Freq: Once | INTRAVENOUS | Status: AC
  Administered 2021-02-19: 500 [IU] via INTRAVENOUS

## 2021-02-19 MED ORDER — SODIUM CHLORIDE 0.9 % IV SOLN: Freq: Once | INTRAVENOUS | Status: AC

## 2021-02-19 MED ORDER — SODIUM CHLORIDE 0.9% FLUSH
10.0000 mL | Freq: Once | INTRAVENOUS | Status: AC
  Administered 2021-02-19: 10 mL

## 2021-02-19 MED ORDER — LORATADINE 10 MG PO TABS
10.0000 mg | ORAL_TABLET | Freq: Once | ORAL | Status: AC
  Administered 2021-02-19: 10 mg via ORAL
  Filled 2021-02-19: qty 1

## 2021-02-19 MED ORDER — SODIUM CHLORIDE 0.9 % IV SOLN
510.0000 mg | Freq: Once | INTRAVENOUS | Status: AC
  Administered 2021-02-19: 510 mg via INTRAVENOUS
  Filled 2021-02-19: qty 510

## 2021-02-19 MED ORDER — FAMOTIDINE 20 MG PO TABS
20.0000 mg | ORAL_TABLET | Freq: Once | ORAL | Status: AC
  Administered 2021-02-19: 20 mg via ORAL
  Filled 2021-02-19: qty 1

## 2021-02-19 NOTE — Progress Notes (Signed)
Patient presents today for Feraheme infusion. Vitals signs stable. Patient has no complaints of any changes since her last visit. MAR reviewed. Patient has chronic back pain she rates an 8/10 today. Patient states she has blotching/discoloration on both arms bilateral posterior that is normal for her.   Feraheme given today per MD orders. Tolerated infusion without adverse affects. Vital signs stable. No complaints at this time. Discharged from clinic ambulatory in stable condition. Alert and oriented x 3. F/U with [facility] as scheduled.

## 2021-02-19 NOTE — Patient Instructions (Signed)
[facility]  Discharge Instructions: Thank you for choosing [facility] to provide your oncology and hematology care.  If you have a lab appointment with the [facility], please come in thru the Main Entrance and check in at the main information desk.  Wear comfortable clothing and clothing appropriate for easy access to any Portacath or PICC line.   We strive to give you quality time with your provider. You may need to reschedule your appointment if you arrive late (15 or more minutes).  Arriving late affects you and other patients whose appointments are after yours.  Also, if you miss three or more appointments without notifying the office, you may be dismissed from the clinic at the providers discretion.      For prescription refill requests, have your pharmacy contact our office and allow 72 hours for refills to be completed.    Today you received the following : Feraheme infusion       To help prevent nausea and vomiting after your treatment, we encourage you to take your nausea medication as directed.  BELOW ARE SYMPTOMS THAT SHOULD BE REPORTED IMMEDIATELY: *FEVER GREATER THAN 100.4 F (38 C) OR HIGHER *CHILLS OR SWEATING *NAUSEA AND VOMITING THAT IS NOT CONTROLLED WITH YOUR NAUSEA MEDICATION *UNUSUAL SHORTNESS OF BREATH *UNUSUAL BRUISING OR BLEEDING *URINARY PROBLEMS (pain or burning when urinating, or frequent urination) *BOWEL PROBLEMS (unusual diarrhea, constipation, pain near the anus) TENDERNESS IN MOUTH AND THROAT WITH OR WITHOUT PRESENCE OF ULCERS (sore throat, sores in mouth, or a toothache) UNUSUAL RASH, SWELLING OR PAIN  UNUSUAL VAGINAL DISCHARGE OR ITCHING   Items with * indicate a potential emergency and should be followed up as soon as possible or go to the Emergency Department if any problems should occur.  Please show the CHEMOTHERAPY ALERT CARD or IMMUNOTHERAPY ALERT CARD at check-in to the Emergency Department and triage  nurse.  Should you have questions after your visit or need to cancel or reschedule your appointment, please contact [facility] [phone]  and follow the prompts.  Office hours are 8:00 a.m. to 4:30 p.m. Monday - Friday. Please note that voicemails left after 4:00 p.m. may not be returned until the following business day.  We are closed weekends and major holidays. You have access to a nurse at all times for urgent questions. Please call the main number to the clinic [phone] and follow the prompts.  For any non-urgent questions, you may also contact your provider using MyChart. We now offer e-Visits for anyone 52 and older to request care online for non-urgent symptoms. For details visit [email or web address].   Also download the MyChart app! Go to the app store, search "MyChart", open the app, select [facility], and log in with your MyChart username and password.  Due to Covid, a mask is required upon entering the hospital/clinic. If you do not have a mask, one will be given to you upon arrival. For doctor visits, patients may have 1 support person aged 52 or older with them. For treatment visits, patients cannot have anyone with them due to current Covid guidelines and our immunocompromised population.

## 2021-02-20 ENCOUNTER — Encounter (INDEPENDENT_AMBULATORY_CARE_PROVIDER_SITE_OTHER): Payer: Self-pay | Admitting: Internal Medicine

## 2021-02-20 ENCOUNTER — Ambulatory Visit (INDEPENDENT_AMBULATORY_CARE_PROVIDER_SITE_OTHER): Payer: Medicare Other | Admitting: Internal Medicine

## 2021-02-20 ENCOUNTER — Encounter (INDEPENDENT_AMBULATORY_CARE_PROVIDER_SITE_OTHER): Payer: Self-pay

## 2021-02-20 ENCOUNTER — Other Ambulatory Visit (INDEPENDENT_AMBULATORY_CARE_PROVIDER_SITE_OTHER): Payer: Self-pay

## 2021-02-20 VITALS — BP 118/72 | HR 88 | Temp 98.3°F | Ht 64.0 in | Wt 181.0 lb

## 2021-02-20 DIAGNOSIS — D500 Iron deficiency anemia secondary to blood loss (chronic): Secondary | ICD-10-CM

## 2021-02-20 DIAGNOSIS — K219 Gastro-esophageal reflux disease without esophagitis: Secondary | ICD-10-CM | POA: Diagnosis not present

## 2021-02-20 DIAGNOSIS — R1310 Dysphagia, unspecified: Secondary | ICD-10-CM

## 2021-02-20 MED ORDER — OMEPRAZOLE 20 MG PO CPDR: 20.0000 mg | DELAYED_RELEASE_CAPSULE | Freq: Two times a day (BID) | ORAL | 5 refills | Status: DC

## 2021-02-20 NOTE — Progress Notes (Signed)
Presenting complaint;  Follow-up for iron deficiency anemia chronic GERD. Patient complains of swallowing difficulty.  Database and subjective:  Patient is 51 year old Caucasian female who is here for scheduled visit.  She has chronic GERD.  She has a history of GI bleed and anemia for which she was evaluated in October and November 2022.  She underwent esophagogastroduodenoscopy and colonoscopy followed by small bowel given capsule study. EGD in October 2022 revealed 6 cm size sliding hiatal hernia and [place] lesions and gastritis.  Gastric biopsy revealed intestinal metaplasia and gastritis.  H. pylori stains were negative. Colonoscopy on November 15, 2020 revealed 2 small polyps and external hemorrhoids.  Both of these polyps are tubular adenomas. Small bowel given capsule study on November 24, 2020 revealed single punctate AV malformation involving duodenal mucosa without stigmata of bleed. Bleeding source was felt to be [facility] lesions and hiatal hernia. Patient remains with intermittent dark stools but stools are always formed..  She had Hemoccults yesterday and only 1 out of 3 were positive.  Patient is receiving intermittent iron infusion.  She had a dose 2 weeks ago another dose of Feraheme yesterday. She says her ovarian cancer is back and she is scheduled to start chemotherapy on 03/05/2021.  Patient says heartburn well controlled with therapy.  However she has been experiencing dysphagia for the last few weeks.  She says she is dysphagia with bread and meat.  Recently she had dysphagia on 3 successive days.  She points to lower sternal area as site of bolus obstruction.  Food bolus usually passes distally within 3 to 4 minutes.   Her appetite is good and her weight has been stable.    Current Medications: Outpatient Encounter Medications as of 02/20/2021  Medication Sig   albuterol (VENTOLIN HFA) 108 (90 Base) MCG/ACT inhaler Inhale 2 puffs into the lungs every 6 (six) hours as needed  for wheezing or shortness of breath.   diazepam (VALIUM) 5 MG tablet TAKE ONE TABLET BY MOUTH AT BEDTIME   docusate sodium (COLACE) 100 MG capsule Take 1 capsule (100 mg total) by mouth daily as needed. To be taken after surgery   gabapentin (NEURONTIN) 100 MG capsule TAKE TWO (2) CAPSULES BY MOUTH AT BEDTIME   omeprazole (PRILOSEC) 40 MG capsule Take 1 capsule (40 mg total) by mouth 2 (two) times daily.   ondansetron (ZOFRAN) 4 MG tablet Take 1 tablet (4 mg total) by mouth every 8 (eight) hours as needed for nausea or vomiting.   Oxycodone HCl 10 MG TABS Take 1 tablet (10 mg total) by mouth every 4 (four) hours as needed.   sennosides-docusate sodium (SENOKOT-S) 8.6-50 MG tablet Take 1 tablet by mouth daily as needed for constipation.   SUMAtriptan (IMITREX) 100 MG tablet TAKE ONE TABLET BY MOUTH PRN UP to TWICE DAILY AS NEEDED.   tiZANidine (ZANAFLEX) 4 MG tablet Take 1 tablet (4 mg total) by mouth 3 (three) times daily as needed for muscle spasms.   triamcinolone cream (KENALOG) 0.1 % Apply 1 application topically 2 (two) times daily as needed (rash).   venlafaxine XR (EFFEXOR-XR) 150 MG 24 hr capsule TAKE ONE CAPSULE BY MOUTH DAILY WITH BREAKFAST   B Complex-C (B-COMPLEX WITH VITAMIN C) tablet Take 1 tablet by mouth daily. (Patient not taking: Reported on 02/20/2021)   Cholecalciferol (VITAMIN D) 50 MCG (2000 UT) CAPS Take 2,000 Units by mouth daily. (Patient not taking: Reported on 02/20/2021)   Magnesium 500 MG TABS Take 500 mg by mouth in the morning. (  Patient not taking: Reported on 02/20/2021)   [DISCONTINUED] Ascorbic Acid (VITAMIN C) 1000 MG tablet Take 2,000 mg by mouth daily.   [DISCONTINUED] levofloxacin (LEVAQUIN) 750 MG tablet Take 1 tablet (750 mg total) by mouth daily.   Facility-Administered Encounter Medications as of 02/20/2021  Medication   0.9 %  sodium chloride infusion   sodium chloride flush (NS) 0.9 % injection 10 mL     Objective: Blood pressure 118/72, pulse 88,  temperature 98.3 F (36.8 C), temperature source Oral, height '5\' 4"'  (1.626 m), weight 181 lb (82.1 kg). Patient is alert and in no acute distress. Conjunctiva is pink. Sclera is nonicteric Oropharyngeal mucosa is normal. No neck masses or thyromegaly noted. Cardiac exam with regular rhythm normal S1 and S2. No murmur or gallop noted. Lungs are clear to auscultation. Abdomen is symmetrical.  She has small scar in upper abdomen.  On palpation abdomen is soft and nontender with organomegaly or masses. No LE edema or clubbing noted.  Labs/studies Results:   CBC Latest Ref Rng & Units 02/05/2021 12/12/2020 11/30/2020  WBC 4.0 - 10.5 K/uL 11.4(H) 6.3 -  Hemoglobin 12.0 - 15.0 g/dL 13.9 13.5 13.0  Hematocrit 36.0 - 46.0 % 41.6 41.4 38.7  Platelets 150 - 400 K/uL 386 327 -    CMP Latest Ref Rng & Units 02/05/2021 12/12/2020 11/16/2020  Glucose 70 - 99 mg/dL 97 115(H) 138(H)  BUN 6 - 20 mg/dL '12 10 8  ' Creatinine 0.44 - 1.00 mg/dL 0.60 0.68 0.70  Sodium 135 - 145 mmol/L 133(L) 137 141  Potassium 3.5 - 5.1 mmol/L 3.9 3.4(L) 3.7  Chloride 98 - 111 mmol/L 99 102 107  CO2 22 - 32 mmol/L '26 26 27  ' Calcium 8.9 - 10.3 mg/dL 8.7(L) 8.9 8.9  Total Protein 6.5 - 8.1 g/dL 7.0 7.3 6.1(L)  Total Bilirubin 0.3 - 1.2 mg/dL 0.5 0.5 0.5  Alkaline Phos 38 - 126 U/L 137(H) 116 102  AST 15 - 41 U/L 32 34 21  ALT 0 - 44 U/L '29 28 16    ' Hepatic Function Latest Ref Rng & Units 02/05/2021 12/12/2020 11/16/2020  Total Protein 6.5 - 8.1 g/dL 7.0 7.3 6.1(L)  Albumin 3.5 - 5.0 g/dL 3.6 3.8 3.5  AST 15 - 41 U/L 32 34 21  ALT 0 - 44 U/L '29 28 16  ' Alk Phosphatase 38 - 126 U/L 137(H) 116 102  Total Bilirubin 0.3 - 1.2 mg/dL 0.5 0.5 0.5  Bilirubin, Direct 0.0 - 0.2 mg/dL - - -     Assessment:  #1.  History of iron deficiency anemia.  Recent H&H was normal.  Hemoccults from yesterday revealed 1 out of 3 positive.  She underwent EGD colonoscopy and small bowel given capsule study last year as above.  Suspect bleeding  from [facility] lesions and she has moderate to large hiatal hernia.  #2.  Chronic GERD.  She is on high-dose PPI.  She is not having heartburn at all.  Therefore I would recommend dropping PPI dose and see how she does.  #3.  Esophageal dysphagia.  Given her history suspect she has developed esophageal stricture.  She will undergo esophagogastroduodenoscopy with dilation prior to initiation of chemotherapy which is scheduled for 03/05/2021.  #4.  Recurrent ovarian cancer.  She is scheduled to start chemo in about 2 weeks.  Her GI issues certainly could get worse with chemo.  We will continue to monitor.   Plan:  Decrease omeprazole dose to 20 mg by mouth 30 minutes  before breakfast and evening meal daily.  If dose reduction does not work she could go back to taking 40 mg twice daily. Esophagogastroduodenoscopy with esophageal dilation to be performed next week. Office visit on as-needed basis.

## 2021-02-20 NOTE — Patient Instructions (Signed)
Esophagogastroduodenoscopy with esophageal dilation to be scheduled. If omeprazole at a dose of 20 mg twice a day does not control heartburn will change therapy again.

## 2021-02-21 ENCOUNTER — Other Ambulatory Visit: Payer: Self-pay

## 2021-02-21 ENCOUNTER — Ambulatory Visit (HOSPITAL_COMMUNITY)
Admission: RE | Admit: 2021-02-21 | Discharge: 2021-02-21 | Disposition: A | Discharge status: Home / Self Care | Payer: Medicare Other | Source: Ambulatory Visit | Attending: Hematology | Admitting: Hematology

## 2021-02-21 ENCOUNTER — Encounter (INDEPENDENT_AMBULATORY_CARE_PROVIDER_SITE_OTHER): Payer: Self-pay

## 2021-02-21 DIAGNOSIS — C563 Malignant neoplasm of bilateral ovaries: Secondary | ICD-10-CM | POA: Diagnosis present

## 2021-02-21 MED ORDER — IOHEXOL 300 MG/ML  SOLN
100.0000 mL | Freq: Once | INTRAMUSCULAR | Status: AC | PRN
  Administered 2021-02-21: 100 mL via INTRAVENOUS

## 2021-02-21 MED ORDER — SODIUM CHLORIDE (PF) 0.9 % IJ SOLN
INTRAMUSCULAR | Status: AC
  Filled 2021-02-21: qty 50

## 2021-02-21 MED ORDER — HEPARIN SOD (PORK) LOCK FLUSH 100 UNIT/ML IV SOLN
500.0000 [IU] | Freq: Once | INTRAVENOUS | Status: AC
  Administered 2021-02-21: 500 [IU] via INTRAVENOUS

## 2021-02-21 MED ORDER — HEPARIN SOD (PORK) LOCK FLUSH 100 UNIT/ML IV SOLN
INTRAVENOUS | Status: AC
  Filled 2021-02-21: qty 5

## 2021-02-26 ENCOUNTER — Encounter (HOSPITAL_COMMUNITY)
Admission: RE | Admit: 2021-02-26 | Discharge: 2021-02-26 | Disposition: A | Discharge status: Home / Self Care | Payer: Medicare Other | Source: Ambulatory Visit | Attending: Internal Medicine | Admitting: Internal Medicine

## 2021-02-26 ENCOUNTER — Other Ambulatory Visit: Payer: Self-pay

## 2021-02-26 ENCOUNTER — Encounter (HOSPITAL_COMMUNITY): Payer: Self-pay

## 2021-02-26 DIAGNOSIS — D509 Iron deficiency anemia, unspecified: Secondary | ICD-10-CM

## 2021-02-26 NOTE — Pre-Procedure Instructions (Signed)
Attempted pre-op phone call. Left a voicemail for her to call back.

## 2021-02-26 NOTE — Progress Notes (Signed)
Pharmacist Chemotherapy Monitoring - Initial Assessment    Anticipated start date: 03/05/21   The following has been reviewed per standard work regarding the patient's treatment regimen: The patient's diagnosis, treatment plan and drug doses, and organ/hematologic function Lab orders and baseline tests specific to treatment regimen  The treatment plan start date, drug sequencing, and pre-medications Prior authorization status  Patient's documented medication list, including drug-drug interaction screen and prescriptions for anti-emetics and supportive care specific to the treatment regimen The drug concentrations, fluid compatibility, administration routes, and timing of the medications to be used The patient's access for treatment and lifetime cumulative dose history, if applicable  The patient's medication allergies and previous infusion related reactions, if applicable   Changes made to treatment plan:  treatment plan date  Follow up needed:  N/A  Patient has received >7 doses of carboplatin in the past.   Wynona Neat, [facility], 02/26/2021  2:03 PM

## 2021-02-28 ENCOUNTER — Other Ambulatory Visit: Payer: Self-pay

## 2021-02-28 ENCOUNTER — Ambulatory Visit (HOSPITAL_BASED_OUTPATIENT_CLINIC_OR_DEPARTMENT_OTHER): Payer: Medicare Other | Admitting: Anesthesiology

## 2021-02-28 ENCOUNTER — Ambulatory Visit (HOSPITAL_COMMUNITY): Payer: Medicare Other | Admitting: Anesthesiology

## 2021-02-28 ENCOUNTER — Other Ambulatory Visit (HOSPITAL_COMMUNITY): Payer: Self-pay | Admitting: *Deleted

## 2021-02-28 ENCOUNTER — Encounter (HOSPITAL_COMMUNITY): Payer: Self-pay

## 2021-02-28 ENCOUNTER — Ambulatory Visit (HOSPITAL_COMMUNITY)
Admission: RE | Admit: 2021-02-28 | Discharge: 2021-02-28 | Disposition: A | Discharge status: Home / Self Care | Payer: Medicare Other | Source: Ambulatory Visit | Attending: Internal Medicine | Admitting: Internal Medicine

## 2021-02-28 ENCOUNTER — Encounter (HOSPITAL_COMMUNITY)
Admission: RE | Disposition: A | Discharge status: Home / Self Care | Payer: Self-pay | Source: Ambulatory Visit | Attending: Internal Medicine

## 2021-02-28 ENCOUNTER — Encounter (HOSPITAL_COMMUNITY): Payer: Self-pay | Admitting: Internal Medicine

## 2021-02-28 DIAGNOSIS — D509 Iron deficiency anemia, unspecified: Secondary | ICD-10-CM

## 2021-02-28 DIAGNOSIS — R1310 Dysphagia, unspecified: Secondary | ICD-10-CM

## 2021-02-28 DIAGNOSIS — R1012 Left upper quadrant pain: Secondary | ICD-10-CM | POA: Insufficient documentation

## 2021-02-28 DIAGNOSIS — Z87891 Personal history of nicotine dependence: Secondary | ICD-10-CM | POA: Insufficient documentation

## 2021-02-28 DIAGNOSIS — C563 Malignant neoplasm of bilateral ovaries: Secondary | ICD-10-CM

## 2021-02-28 DIAGNOSIS — K449 Diaphragmatic hernia without obstruction or gangrene: Secondary | ICD-10-CM | POA: Insufficient documentation

## 2021-02-28 DIAGNOSIS — R1314 Dysphagia, pharyngoesophageal phase: Secondary | ICD-10-CM | POA: Insufficient documentation

## 2021-02-28 DIAGNOSIS — K222 Esophageal obstruction: Secondary | ICD-10-CM | POA: Insufficient documentation

## 2021-02-28 DIAGNOSIS — R112 Nausea with vomiting, unspecified: Secondary | ICD-10-CM

## 2021-02-28 DIAGNOSIS — C569 Malignant neoplasm of unspecified ovary: Secondary | ICD-10-CM | POA: Diagnosis not present

## 2021-02-28 DIAGNOSIS — D500 Iron deficiency anemia secondary to blood loss (chronic): Secondary | ICD-10-CM

## 2021-02-28 DIAGNOSIS — K219 Gastro-esophageal reflux disease without esophagitis: Secondary | ICD-10-CM

## 2021-02-28 HISTORY — PX: ESOPHAGEAL DILATION: SHX303

## 2021-02-28 HISTORY — PX: ESOPHAGOGASTRODUODENOSCOPY (EGD) WITH PROPOFOL: SHX5813

## 2021-02-28 LAB — CBC WITH DIFFERENTIAL/PLATELET
Abs Immature Granulocytes: 0.04 10*3/uL (ref 0.00–0.07)
Basophils Absolute: 0.1 10*3/uL (ref 0.0–0.1)
Basophils Relative: 0 %
Eosinophils Absolute: 0.3 10*3/uL (ref 0.0–0.5)
Eosinophils Relative: 2 %
HCT: 39.7 % (ref 36.0–46.0)
Hemoglobin: 12.6 g/dL (ref 12.0–15.0)
Immature Granulocytes: 0 %
Lymphocytes Relative: 20 %
Lymphs Abs: 2.5 10*3/uL (ref 0.7–4.0)
MCH: 34.1 pg — ABNORMAL HIGH (ref 26.0–34.0)
MCHC: 31.7 g/dL (ref 30.0–36.0)
MCV: 107.3 fL — ABNORMAL HIGH (ref 80.0–100.0)
Monocytes Absolute: 1 10*3/uL (ref 0.1–1.0)
Monocytes Relative: 8 %
Neutro Abs: 8.5 10*3/uL — ABNORMAL HIGH (ref 1.7–7.7)
Neutrophils Relative %: 70 %
Platelets: 439 10*3/uL — ABNORMAL HIGH (ref 150–400)
RBC: 3.7 MIL/uL — ABNORMAL LOW (ref 3.87–5.11)
RDW: 14 % (ref 11.5–15.5)
WBC: 12.4 10*3/uL — ABNORMAL HIGH (ref 4.0–10.5)
nRBC: 0 % (ref 0.0–0.2)

## 2021-02-28 SURGERY — ESOPHAGOGASTRODUODENOSCOPY (EGD) WITH PROPOFOL
Anesthesia: General

## 2021-02-28 MED ORDER — METOCLOPRAMIDE HCL 10 MG PO TABS: 10.0000 mg | ORAL_TABLET | Freq: Three times a day (TID) | ORAL | 1 refills | Status: DC

## 2021-02-28 MED ORDER — OMEPRAZOLE 40 MG PO CPDR: 40.0000 mg | DELAYED_RELEASE_CAPSULE | Freq: Two times a day (BID) | ORAL | 5 refills | Status: DC

## 2021-02-28 MED ORDER — PROPOFOL 10 MG/ML IV BOLUS
INTRAVENOUS | Status: DC | PRN
  Administered 2021-02-28 (×2): 50 mg via INTRAVENOUS

## 2021-02-28 MED ORDER — LACTATED RINGERS IV SOLN: INTRAVENOUS | Status: DC

## 2021-02-28 MED ORDER — PROPOFOL 500 MG/50ML IV EMUL
INTRAVENOUS | Status: DC | PRN
  Administered 2021-02-28: 180 ug/kg/min via INTRAVENOUS

## 2021-02-28 MED ORDER — PROPOFOL 500 MG/50ML IV EMUL
INTRAVENOUS | Status: AC
  Filled 2021-02-28: qty 50

## 2021-02-28 NOTE — H&P (Signed)
Michelle Holt is an 51 y.o. female.   Chief Complaint: Patient is here for esophagogastroduodenoscopy and esophageal dilation. HPI: Patient is 51 year old Caucasian female with complicated medical history who is getaway to be treated for metastatic ovarian carcinoma.  She was seen in the office last week for few week history of solid food dysphagia.  She was therefore scheduled for EGD dilation prior to initiation of chemotherapy.  Patient complains of abdominal pain which is primarily in epigastrium and right upper quadrant.  This pain started 6 days ago.  She is having postprandial nausea and vomiting.  Did notice some blood while brushing her teeth this morning.  She has not experienced melena or rectal bleeding. She has history of iron deficiency anemia for which she has been evaluated in October and November last year. Patient does not take aspirin or anticoagulants.  Past Medical History:  Diagnosis Date   Anemia    Anxiety and depression    Arthritis of facet joints at multiple vertebral levels    L5-S1   Constipation    Dyslipidemia    Family history of breast cancer    Family history of uterine cancer    GERD (gastroesophageal reflux disease)    History of hiatal hernia    History of kidney stones    Insomnia    Irritable bowel syndrome    Migraine    Muscle tension headache    Neuropathy of finger    Ovarian carcinoma (HCC)    ovarian   Plantar fasciitis of right foot    Port-A-Cath in place 08/20/2018    Past Surgical History:  Procedure Laterality Date   ABDOMINAL HYSTERECTOMY     BIOPSY  10/27/2020   Procedure: BIOPSY;  Surgeon: Harvel Quale, MD;  [facility];  Service: Gastroenterology;;   CHOLECYSTECTOMY  2008   COLONOSCOPY N/A 08/13/2013   Procedure: COLONOSCOPY;  Surgeon: Rogene Houston, MD;  [facility];  Service: Endoscopy;  Laterality: N/A;  230-moved to 145 Ann to notify pt   COLONOSCOPY WITH PROPOFOL N/A 11/15/2020    Procedure: COLONOSCOPY WITH PROPOFOL;  Surgeon: Rogene Houston, MD;  [facility];  Service: Endoscopy;  Laterality: N/A;  1:40   ESOPHAGOGASTRODUODENOSCOPY     ESOPHAGOGASTRODUODENOSCOPY (EGD) WITH PROPOFOL N/A 07/20/2018   Procedure: ESOPHAGOGASTRODUODENOSCOPY (EGD) WITH PROPOFOL;  Surgeon: Rogene Houston, MD;  [facility];  Service: Endoscopy;  Laterality: N/A;  Possible esophageal dilation.   ESOPHAGOGASTRODUODENOSCOPY (EGD) WITH PROPOFOL N/A 10/27/2020   Procedure: ESOPHAGOGASTRODUODENOSCOPY (EGD) WITH PROPOFOL;  Surgeon: Harvel Quale, MD;  [facility];  Service: Gastroenterology;  Laterality: N/A;  2:10, pt knows to arrive at 10:15   [place] 11/24/2020   Procedure: [place];  Surgeon: Rogene Houston, MD;  [facility];  Service: Endoscopy;  Laterality: N/A;  7:30   IR ANGIOGRAM SELECTIVE EACH ADDITIONAL VESSEL  08/01/2018   IR ANGIOGRAM VISCERAL SELECTIVE  08/01/2018   IR EMBO ART  VEN HEMORR LYMPH EXTRAV  INC GUIDE ROADMAPPING  08/01/2018   IR IMAGING GUIDED PORT INSERTION  08/20/2018   IR PERC PLEURAL DRAIN W/INDWELL CATH W/IMG GUIDE  07/08/2018   IR THORACENTESIS ASP PLEURAL SPACE W/IMG GUIDE  07/07/2018   IR US GUIDE VASC ACCESS RIGHT  08/01/2018   PLEURAL EFFUSION DRAINAGE Left 07/13/2018   Procedure: DRAINAGE OF LOCULATED PLEURAL EFFUSION;  Surgeon: Ivin Poot, MD;  Location: [place];  Service: Thoracic;  Laterality: Left;   POLYPECTOMY  11/15/2020   Procedure: POLYPECTOMY;  Surgeon: Rogene Houston, MD;  [facility];  Service: Endoscopy;;   REMOVAL OF PLEURAL DRAINAGE CATHETER Left 08/20/2018   Procedure: REMOVAL OF PLEURAL DRAINAGE CATHETER;  Surgeon: Ivin Poot, MD;  Location: [place];  Service: Thoracic;  Laterality: Left;   REMOVAL OF PLEURAL DRAINAGE CATHETER Left 08/20/2018   Procedure: REMOVAL OF PLEURAL DRAINAGE CATHETER;  Surgeon: Ivin Poot, MD;  Location: [place];   Service: Thoracic;  Laterality: Left;   TALC PLEURODESIS Left 07/13/2018   Procedure: [place] Pleuradesis;  Surgeon: Prescott Gum, Collier Salina, MD;  Location: [place];  Service: Thoracic;  Laterality: Left;   TOTAL HIP ARTHROPLASTY Left 06/05/2020   Procedure: LEFT TOTAL HIP ARTHROPLASTY ANTERIOR APPROACH;  Surgeon: Leandrew Koyanagi, MD;  Location: [place];  Service: Orthopedics;  Laterality: Left;  3-C   TUBAL LIGATION Bilateral    UTERINE ABLATION     VIDEO ASSISTED THORACOSCOPY Left 07/13/2018   Procedure: VIDEO ASSISTED THORACOSCOPY;  Surgeon: Prescott Gum, Collier Salina, MD;  [facility];  Service: Thoracic;  Laterality: Left;    Family History  Problem Relation Age of Onset   Hypertension Mother    Obesity Mother    Diabetes Mother    Kidney disease Mother    Peripheral vascular disease Father    Atrial fibrillation Father    COPD Brother    Osteoporosis Brother    Crohn's disease Sister    Uterine cancer Sister 74       maternal half sister   Breast cancer Maternal Aunt 32   Colon cancer Neg Hx    Social History:  reports that she quit smoking about 2 years ago. Her smoking use included cigarettes. She has a 8.50 pack-year smoking history. She has never used smokeless tobacco. She reports that she does not currently use alcohol after a past usage of about 1.0 standard drink per week. She reports that she does not use drugs.  Allergies:  Allergies  Allergen Reactions   Morphine And Related Itching   Nickel Itching   Nortriptyline Other (See Comments)    Significant weight gain   Topamax [Topiramate] Diarrhea and Nausea Only   Xanax [Alprazolam]     Can't wake up    Actifed Cold-Allergy [Chlorpheniramine-Phenyleph Er] Rash   Amoxicillin Rash    Did it involve swelling of the face/tongue/throat, SOB, or low BP? Unknown Did it involve sudden or severe rash/hives, skin peeling, or any reaction on the inside of your mouth or nose? Unknown Did you need to seek medical attention at a hospital or  doctor's office? Unknown When did it last happen? teenager       If all above answers are "NO", may proceed with cephalosporin use.    Codeine Hives   Erythromycin Rash   Penicillins Rash    Did it involve swelling of the face/tongue/throat, SOB, or low BP? Unknown Did it involve sudden or severe rash/hives, skin peeling, or any reaction on the inside of your mouth or nose? Unknown Did you need to seek medical attention at a hospital or doctor's office? Unknown When did it last happen? teenager       If all above answers are "NO", may proceed with cephalosporin use.    Sudafed [Pseudoephedrine Hcl] Rash    Medications Prior to Admission  Medication Sig Dispense Refill   albuterol (VENTOLIN HFA) 108 (90 Base) MCG/ACT inhaler Inhale 2 puffs into the lungs every  6 (six) hours as needed for wheezing or shortness of breath.     diazepam (VALIUM) 5 MG tablet TAKE ONE TABLET BY MOUTH AT BEDTIME 30 tablet 5   docusate sodium (COLACE) 100 MG capsule Take 1 capsule (100 mg total) by mouth daily as needed. To be taken after surgery 30 capsule 2   gabapentin (NEURONTIN) 100 MG capsule TAKE TWO (2) CAPSULES BY MOUTH AT BEDTIME 60 capsule 2   lidocaine-prilocaine (EMLA) cream Apply 1 application topically as needed (pain).     Magnesium 500 MG TABS Take 500 mg by mouth in the morning.     omeprazole (PRILOSEC) 20 MG capsule Take 1 capsule (20 mg total) by mouth 2 (two) times daily before a meal. 60 capsule 5   ondansetron (ZOFRAN) 4 MG tablet Take 1 tablet (4 mg total) by mouth every 8 (eight) hours as needed for nausea or vomiting. 40 tablet 3   Oxycodone HCl 10 MG TABS Take 1 tablet (10 mg total) by mouth every 4 (four) hours as needed. 168 tablet 0   sennosides-docusate sodium (SENOKOT-S) 8.6-50 MG tablet Take 1 tablet by mouth daily as needed for constipation.     SUMAtriptan (IMITREX) 100 MG tablet TAKE ONE TABLET BY MOUTH PRN UP to TWICE DAILY AS NEEDED. 9 tablet 5   tiZANidine (ZANAFLEX) 4 MG  tablet Take 1 tablet (4 mg total) by mouth 3 (three) times daily as needed for muscle spasms. 60 tablet 0   triamcinolone cream (KENALOG) 0.1 % Apply 1 application topically 2 (two) times daily as needed (rash). 80 g 11   venlafaxine XR (EFFEXOR-XR) 150 MG 24 hr capsule TAKE ONE CAPSULE BY MOUTH DAILY WITH BREAKFAST 30 capsule 6   B Complex-C (B-COMPLEX WITH VITAMIN C) tablet Take 1 tablet by mouth daily. (Patient not taking: Reported on 02/20/2021)     Cholecalciferol (VITAMIN D) 50 MCG (2000 UT) CAPS Take 2,000 Units by mouth daily. (Patient not taking: Reported on 02/20/2021)      No results found for this or any previous visit (from the past 48 hour(s)). No results found.  Review of Systems  Pulse 83, temperature 98.6 F (37 C), temperature source Oral, resp. rate 16, height 5\' 4"  (1.626 m), weight 82.1 kg, SpO2 95 %. Physical Exam HENT:     Mouth/Throat:     Mouth: Mucous membranes are moist.     Pharynx: Oropharynx is clear.  Eyes:     General: No scleral icterus.    Conjunctiva/sclera: Conjunctivae normal.  Cardiovascular:     Rate and Rhythm: Normal rate and regular rhythm.     Heart sounds: Normal heart sounds. No murmur heard. Pulmonary:     Effort: Pulmonary effort is normal.     Breath sounds: Normal breath sounds.  Abdominal:     Comments: Abdomen is full.  Bowel sounds are normal.  On palpation abdomen is soft.  She has mild tenderness across lower half of the abdomen and mild to moderate tenderness in midepigastrium.  No guarding.  No organomegaly or masses.  Musculoskeletal:     Cervical back: Neck supple.  Lymphadenopathy:     Cervical: No cervical adenopathy.  Skin:    General: Skin is warm and dry.  Neurological:     Mental Status: She is alert.     Assessment/Plan  Esophageal dysphagia Nausea vomiting and abdominal pain. Esophagogastroduodenoscopy with esophageal dilation  Hildred Laser, MD 02/28/2021, 12:47 PM

## 2021-02-28 NOTE — Anesthesia Postprocedure Evaluation (Signed)
Anesthesia Post Note  Patient: Michelle Holt  Procedure(s) Performed: ESOPHAGOGASTRODUODENOSCOPY (EGD) WITH PROPOFOL ESOPHAGEAL DILATION  Patient location during evaluation: Phase II Anesthesia Type: General Level of consciousness: awake Pain management: pain level controlled Vital Signs Assessment: post-procedure vital signs reviewed and stable Respiratory status: spontaneous breathing and respiratory function stable Cardiovascular status: blood pressure returned to baseline and stable Postop Assessment: no headache and no apparent nausea or vomiting Anesthetic complications: no Comments: Late entry   No notable events documented.   Last Vitals:  Vitals:   02/28/21 1226 02/28/21 1317  BP:  (!) 102/43  Pulse: 83 78  Resp: 16   Temp: 37 C 37 C  SpO2: 95% 95%    Last Pain:  Vitals:   02/28/21 1317  TempSrc: Oral  PainSc: 0-No pain                 Louann Sjogren

## 2021-02-28 NOTE — Telephone Encounter (Signed)
Per Dr. Delton Coombes, increase Oxycodone to 20 mg every 4 hours as needed.  May take two of the 10 mg every four hours as needed until she needs refill.  New script sent to her pharmacy.  Patient aware and verbalized understanding.

## 2021-02-28 NOTE — Discharge Instructions (Signed)
Increase omeprazole to 40 mg by mouth 30 minutes before breakfast and evening meal daily. Metoclopramide 10 mg by mouth 30 minutes before each meal.  Stop this medication if you experience any side effects. Resume other medications as before. Full liquids today and usual diet starting tomorrow. No driving for 24 hours. If vomiting persists report to emergency room.

## 2021-02-28 NOTE — Transfer of Care (Signed)
Immediate Anesthesia Transfer of Care Note  Patient: Michelle Holt  Procedure(s) Performed: ESOPHAGOGASTRODUODENOSCOPY (EGD) WITH PROPOFOL ESOPHAGEAL DILATION  Patient Location: PACU  Anesthesia Type:General  Level of Consciousness: awake, alert  and oriented  Airway & Oxygen Therapy: Patient Spontanous Breathing  Post-op Assessment: Report given to RN, Post -op Vital signs reviewed and stable, Patient moving all extremities X 4 and Patient able to stick tongue midline  Post vital signs: Reviewed  Last Vitals:  Vitals Value Taken Time  BP 102/43   Temp 98.6   Pulse 60   Resp 15   SpO2 97     Last Pain:  Vitals:   02/28/21 1251  TempSrc:   PainSc: 5       Patients Stated Pain Goal: 6 (02/04/22 1146)  Complications: No notable events documented.

## 2021-02-28 NOTE — Anesthesia Preprocedure Evaluation (Signed)
Anesthesia Evaluation  Patient identified by MRN, date of birth, ID band Patient awake    Reviewed: Allergy & Precautions, H&P , NPO status , Patient's Chart, lab work & pertinent test results, reviewed documented beta blocker date and time   Airway Mallampati: II  TM Distance: >3 FB Neck ROM: full    Dental no notable dental hx. (+) Teeth Intact   Pulmonary neg pulmonary ROS, former smoker,    Pulmonary exam normal breath sounds clear to auscultation       Cardiovascular Exercise Tolerance: Good negative cardio ROS   Rhythm:regular Rate:Normal     Neuro/Psych  Headaches, PSYCHIATRIC DISORDERS Anxiety Depression  Neuromuscular disease    GI/Hepatic Neg liver ROS, hiatal hernia, GERD  Medicated,  Endo/Other  negative endocrine ROS  Renal/GU negative Renal ROS  negative genitourinary   Musculoskeletal   Abdominal   Peds  Hematology  (+) Blood dyscrasia, anemia ,   Anesthesia Other Findings   Reproductive/Obstetrics negative OB ROS                             Anesthesia Physical  Anesthesia Plan  ASA: 2  Anesthesia Plan: General   Post-op Pain Management:    Induction:   PONV Risk Score and Plan: Propofol infusion  Airway Management Planned:   Additional Equipment:   Intra-op Plan:   Post-operative Plan:   Informed Consent: I have reviewed the patients History and Physical, chart, labs and discussed the procedure including the risks, benefits and alternatives for the proposed anesthesia with the patient or authorized representative who has indicated his/her understanding and acceptance.     Dental Advisory Given  Plan Discussed with: CRNA  Anesthesia Plan Comments:         Anesthesia Quick Evaluation

## 2021-02-28 NOTE — Op Note (Signed)
[facility] Patient Name: Michelle Holt Procedure Date: 02/28/2021 12:30 PM MRN: [id number] Date of Birth: [birth year] Attending MD: Hildred Laser , MD CSN: [id number] Age: 51 Admit Type: Outpatient Procedure:                Upper GI endoscopy Indications:              Abdominal pain in the left upper quadrant,                            Esophageal dysphagia, Nausea with vomiting Providers:                Hildred Laser, MD, Crystal Page, Sed[place] Risa Grill, Technician Referring MD:             Derek Jack, MD                           [name] Medicines:                Propofol per Anesthesia Complications:            No immediate complications. Estimated Blood Loss:     Estimated blood loss: none. Procedure:                Pre-Anesthesia Assessment:                           - Prior to the procedure, a History and Physical                            was performed, and patient medications and                            allergies were reviewed. The patient's tolerance of                            previous anesthesia was also reviewed. The risks                            and benefits of the procedure and the sedation                            options and risks were discussed with the patient.                            All questions were answered, and informed consent                            was obtained. Prior Anticoagulants: The patient has                            taken no previous anticoagulant or antiplatelet                            agents.  ASA Grade Assessment: III - A patient with                            severe systemic disease. After reviewing the risks                            and benefits, the patient was deemed in                            satisfactory condition to undergo the procedure.                           After obtaining informed consent, the endoscope was                            passed under direct  vision. Throughout the                            procedure, the patient's blood pressure, pulse, and                            oxygen saturations were monitored continuously. The                            GIF-H190 ([id number]) scope was introduced through the                            mouth, and advanced to the second part of duodenum.                            The upper GI endoscopy was accomplished without                            difficulty. The patient tolerated the procedure                            well. Scope In: 12:57:20 PM Scope Out: 1:09:20 PM Total Procedure Duration: 0 hours 12 minutes 0 seconds  Findings:      The hypopharynx was normal.      The examined esophagus was normal.      A non-obstructing Schatzki ring was found at the gastroesophageal       junction. A TTS dilator was passed through the scope. Dilation with a       15-16.5-18 mm balloon dilator was performed to 15 mm, 16.5 mm and 18 mm.       The dilation site was examined and showed no change and no bleeding,       mucosal tear or perforation.      An 8 cm hiatal hernia was present.      The entire examined stomach was normal.      The duodenal bulb and second portion of the duodenum were normal. Impression:               - Normal hypopharynx.                           -  Normal esophagus.                           - Non-obstructing Schatzki ring. Dilated.                           - 8 cm hiatal hernia.                           - Normal stomach.                           - Normal duodenal bulb and second portion of the                            duodenum.                           - No specimens collected. Moderate Sedation:      Per Anesthesia Care Recommendation:           - Patient has a contact number available for                            emergencies. The signs and symptoms of potential                            delayed complications were discussed with the                            patient.  Return to normal activities tomorrow.                            Written discharge instructions were provided to the                            patient.                           - Full liquid diet today.                           - Continue present medications.                           - Increase omeprazole to 40 mg p.o. twice daily                           - Metoclopramide 10 mg by mouth 30 minutes before                            each meal. Procedure Code(s):        --- Professional ---                           [phone], Esophagogastroduodenoscopy, flexible,  transoral; with transendoscopic balloon dilation of                            esophagus (less than 30 mm diameter) Diagnosis Code(s):        --- Professional ---                           K22.2, Esophageal obstruction                           K44.9, Diaphragmatic hernia without obstruction or                            gangrene                           R10.12, Left upper quadrant pain                           R13.14, Dysphagia, pharyngoesophageal phase                           R11.2, Nausea with vomiting, unspecified CPT copyright 2019 American Medical Association. All rights reserved. The codes documented in this report are preliminary and upon coder review may  be revised to meet current compliance requirements. Hildred Laser, MD Hildred Laser, MD 02/28/2021 1:18:53 PM This report has been signed electronically. Number of Addenda: 0

## 2021-03-01 ENCOUNTER — Other Ambulatory Visit (HOSPITAL_COMMUNITY): Payer: Self-pay | Admitting: *Deleted

## 2021-03-01 ENCOUNTER — Encounter (HOSPITAL_COMMUNITY): Payer: Self-pay | Admitting: Hematology

## 2021-03-01 MED ORDER — OXYCODONE HCL 20 MG PO TABS: 1.0000 | ORAL_TABLET | ORAL | 0 refills | Status: DC | PRN

## 2021-03-02 ENCOUNTER — Encounter (HOSPITAL_COMMUNITY): Payer: Self-pay | Admitting: Internal Medicine

## 2021-03-05 ENCOUNTER — Encounter (HOSPITAL_COMMUNITY): Payer: Self-pay

## 2021-03-05 ENCOUNTER — Inpatient Hospital Stay (HOSPITAL_BASED_OUTPATIENT_CLINIC_OR_DEPARTMENT_OTHER): Payer: Medicare Other | Admitting: Hematology

## 2021-03-05 ENCOUNTER — Encounter (HOSPITAL_COMMUNITY): Payer: Self-pay | Admitting: Hematology

## 2021-03-05 ENCOUNTER — Other Ambulatory Visit: Payer: Self-pay

## 2021-03-05 ENCOUNTER — Inpatient Hospital Stay (HOSPITAL_COMMUNITY): Payer: Medicare Other

## 2021-03-05 VITALS — BP 100/66 | HR 95 | Temp 97.7°F | Resp 20 | Ht 63.78 in | Wt 184.0 lb

## 2021-03-05 VITALS — BP 105/52 | HR 87 | Temp 98.5°F | Resp 18

## 2021-03-05 DIAGNOSIS — C563 Malignant neoplasm of bilateral ovaries: Secondary | ICD-10-CM

## 2021-03-05 DIAGNOSIS — Z95828 Presence of other vascular implants and grafts: Secondary | ICD-10-CM

## 2021-03-05 DIAGNOSIS — Z5111 Encounter for antineoplastic chemotherapy: Secondary | ICD-10-CM | POA: Diagnosis not present

## 2021-03-05 LAB — COMPREHENSIVE METABOLIC PANEL
ALT: 21 U/L (ref 0–44)
AST: 30 U/L (ref 15–41)
Albumin: 3.2 g/dL — ABNORMAL LOW (ref 3.5–5.0)
Alkaline Phosphatase: 155 U/L — ABNORMAL HIGH (ref 38–126)
Anion gap: 9 (ref 5–15)
BUN: 12 mg/dL (ref 6–20)
CO2: 28 mmol/L (ref 22–32)
Calcium: 9 mg/dL (ref 8.9–10.3)
Chloride: 100 mmol/L (ref 98–111)
Creatinine, Ser: 0.62 mg/dL (ref 0.44–1.00)
GFR, Estimated: >60 mL/min (ref >60)
Glucose, Bld: 98 mg/dL (ref 70–99)
Potassium: 3.9 mmol/L (ref 3.5–5.1)
Sodium: 137 mmol/L (ref 135–145)
Total Bilirubin: 0.7 mg/dL (ref 0.3–1.2)
Total Protein: 6.6 g/dL (ref 6.5–8.1)

## 2021-03-05 LAB — CBC WITH DIFFERENTIAL/PLATELET
Abs Immature Granulocytes: 0.04 10*3/uL (ref 0.00–0.07)
Basophils Absolute: 0.1 10*3/uL (ref 0.0–0.1)
Basophils Relative: 1 %
Eosinophils Absolute: 0.3 10*3/uL (ref 0.0–0.5)
Eosinophils Relative: 3 %
HCT: 38.5 % (ref 36.0–46.0)
Hemoglobin: 12.7 g/dL (ref 12.0–15.0)
Immature Granulocytes: 0 %
Lymphocytes Relative: 13 %
Lymphs Abs: 1.5 10*3/uL (ref 0.7–4.0)
MCH: 35.3 pg — ABNORMAL HIGH (ref 26.0–34.0)
MCHC: 33 g/dL (ref 30.0–36.0)
MCV: 106.9 fL — ABNORMAL HIGH (ref 80.0–100.0)
Monocytes Absolute: 1.1 10*3/uL — ABNORMAL HIGH (ref 0.1–1.0)
Monocytes Relative: 10 %
Neutro Abs: 8.2 10*3/uL — ABNORMAL HIGH (ref 1.7–7.7)
Neutrophils Relative %: 73 %
Platelets: 437 10*3/uL — ABNORMAL HIGH (ref 150–400)
RBC: 3.6 MIL/uL — ABNORMAL LOW (ref 3.87–5.11)
RDW: 13.6 % (ref 11.5–15.5)
WBC: 11.2 10*3/uL — ABNORMAL HIGH (ref 4.0–10.5)
nRBC: 0 % (ref 0.0–0.2)

## 2021-03-05 LAB — MAGNESIUM: Magnesium: 1.8 mg/dL (ref 1.7–2.4)

## 2021-03-05 MED ORDER — SODIUM CHLORIDE 0.9 % IV SOLN: Freq: Once | INTRAVENOUS | Status: AC

## 2021-03-05 MED ORDER — SODIUM CHLORIDE 0.9 % IV SOLN
793.2000 mg | Freq: Once | INTRAVENOUS | Status: AC
  Administered 2021-03-05: 790 mg via INTRAVENOUS
  Filled 2021-03-05: qty 79

## 2021-03-05 MED ORDER — SODIUM CHLORIDE 0.9% FLUSH
10.0000 mL | INTRAVENOUS | Status: DC | PRN
  Administered 2021-03-05: 10 mL

## 2021-03-05 MED ORDER — SODIUM CHLORIDE 0.9 % IV SOLN
150.0000 mg | Freq: Once | INTRAVENOUS | Status: AC
  Administered 2021-03-05: 150 mg via INTRAVENOUS
  Filled 2021-03-05: qty 150

## 2021-03-05 MED ORDER — DICYCLOMINE HCL 10 MG PO CAPS
10.0000 mg | ORAL_CAPSULE | Freq: Three times a day (TID) | ORAL | 3 refills | Status: DC | PRN
Start: 2021-03-05 — End: 2022-12-15

## 2021-03-05 MED ORDER — SODIUM CHLORIDE 0.9 % IV SOLN
175.0000 mg/m2 | Freq: Once | INTRAVENOUS | Status: AC
  Administered 2021-03-05: 336 mg via INTRAVENOUS
  Filled 2021-03-05: qty 56

## 2021-03-05 MED ORDER — DEXAMETHASONE 4 MG PO TABS: 4.0000 mg | ORAL_TABLET | Freq: Every day | ORAL | 2 refills | Status: DC

## 2021-03-05 MED ORDER — PALONOSETRON HCL INJECTION 0.25 MG/5ML
0.2500 mg | Freq: Once | INTRAVENOUS | Status: AC
  Administered 2021-03-05: 0.25 mg via INTRAVENOUS
  Filled 2021-03-05: qty 5

## 2021-03-05 MED ORDER — FAMOTIDINE IN NACL 20-0.9 MG/50ML-% IV SOLN
20.0000 mg | Freq: Once | INTRAVENOUS | Status: AC
  Administered 2021-03-05: 20 mg via INTRAVENOUS
  Filled 2021-03-05: qty 50

## 2021-03-05 MED ORDER — SODIUM CHLORIDE 0.9 % IV SOLN
10.0000 mg | Freq: Once | INTRAVENOUS | Status: AC
  Administered 2021-03-05: 10 mg via INTRAVENOUS
  Filled 2021-03-05: qty 10

## 2021-03-05 MED ORDER — HEPARIN SOD (PORK) LOCK FLUSH 100 UNIT/ML IV SOLN
500.0000 [IU] | Freq: Once | INTRAVENOUS | Status: AC | PRN
  Administered 2021-03-05: 500 [IU]

## 2021-03-05 MED ORDER — DIPHENHYDRAMINE HCL 50 MG/ML IJ SOLN
50.0000 mg | Freq: Once | INTRAMUSCULAR | Status: AC
  Administered 2021-03-05: 50 mg via INTRAVENOUS
  Filled 2021-03-05: qty 1

## 2021-03-05 NOTE — Patient Instructions (Signed)
[facility]  Discharge Instructions: Thank you for choosing [facility] to provide your oncology and hematology care.  If you have a lab appointment with the Cancer Center, please come in thru the Main Entrance and check in at the main information desk.  Wear comfortable clothing and clothing appropriate for easy access to any Portacath or PICC line.   We strive to give you quality time with your provider. You may need to reschedule your appointment if you arrive late (15 or more minutes).  Arriving late affects you and other patients whose appointments are after yours.  Also, if you miss three or more appointments without notifying the office, you may be dismissed from the clinic at the provider's discretion.      For prescription refill requests, have your pharmacy contact our office and allow 72 hours for refills to be completed.    Today you received the following chemotherapy and/or immunotherapy agents Taxol/Carboplatin.       To help prevent nausea and vomiting after your treatment, we encourage you to take your nausea medication as directed.  BELOW ARE SYMPTOMS THAT SHOULD BE REPORTED IMMEDIATELY: *FEVER GREATER THAN 100.4 F (38 C) OR HIGHER *CHILLS OR SWEATING *NAUSEA AND VOMITING THAT IS NOT CONTROLLED WITH YOUR NAUSEA MEDICATION *UNUSUAL SHORTNESS OF BREATH *UNUSUAL BRUISING OR BLEEDING *URINARY PROBLEMS (pain or burning when urinating, or frequent urination) *BOWEL PROBLEMS (unusual diarrhea, constipation, pain near the anus) TENDERNESS IN MOUTH AND THROAT WITH OR WITHOUT PRESENCE OF ULCERS (sore throat, sores in mouth, or a toothache) UNUSUAL RASH, SWELLING OR PAIN  UNUSUAL VAGINAL DISCHARGE OR ITCHING   Items with * indicate a potential emergency and should be followed up as soon as possible or go to the Emergency Department if any problems should occur.  Please show the CHEMOTHERAPY ALERT CARD or IMMUNOTHERAPY ALERT CARD at check-in to the  Emergency Department and triage nurse.  Should you have questions after your visit or need to cancel or reschedule your appointment, please contact [facility] [phone]  and follow the prompts.  Office hours are 8:00 a.m. to 4:30 p.m. Monday - Friday. Please note that voicemails left after 4:00 p.m. may not be returned until the following business day.  We are closed weekends and major holidays. You have access to a nurse at all times for urgent questions. Please call the main number to the clinic [phone] and follow the prompts.  For any non-urgent questions, you may also contact your provider using MyChart. We now offer e-Visits for anyone 18 and older to request care online for non-urgent symptoms. For details visit [email or web address].   Also download the MyChart app! Go to the app store, search "MyChart", open the app, select [facility], and log in with your MyChart username and password.  Due to Covid, a mask is required upon entering the hospital/clinic. If you do not have a mask, one will be given to you upon arrival. For doctor visits, patients may have 1 support person aged 18 or older with them. For treatment visits, patients cannot have anyone with them due to current Covid guidelines and our immunocompromised population.  

## 2021-03-05 NOTE — Addendum Note (Signed)
Addended by: Derek Jack on: 03/05/2021 04:53 PM   Modules accepted: Orders

## 2021-03-05 NOTE — Patient Instructions (Signed)
[place] at [facility] Discharge Instructions   You were seen and examined today by Dr. Delton Coombes.  He reviewed the results of your lab work which is normal/stable.  We will proceed with your treatment today.  Return as scheduled for lab work, office visit, and treatment.   Thank you for choosing [place] at [facility] to provide your oncology and hematology care.  To afford each patient quality time with our provider, please arrive at least 15 minutes before your scheduled appointment time.   If you have a lab appointment with the [facility] please come in thru the Main Entrance and check in at the main information desk.  You need to re-schedule your appointment should you arrive 10 or more minutes late.  We strive to give you quality time with our providers, and arriving late affects you and other patients whose appointments are after yours.  Also, if you no show three or more times for appointments you may be dismissed from the clinic at the providers discretion.     Again, thank you for choosing [facility].  Our hope is that these requests will decrease the amount of time that you wait before being seen by our physicians.       _____________________________________________________________  Should you have questions after your visit to [facility], please contact our office at [phone] and follow the prompts.  Our office hours are 8:00 a.m. and 4:30 p.m. Monday - Friday.  Please note that voicemails left after 4:00 p.m. may not be returned until the following business day.  We are closed weekends and major holidays.  You do have access to a nurse 24-7, just call the main number to the clinic [phone] and do not press any options, hold on the line and a nurse will answer the phone.    For prescription refill requests, have your pharmacy contact our office and allow 72 hours.    Due to Covid, you  will need to wear a mask upon entering the hospital. If you do not have a mask, a mask will be given to you at the Main Entrance upon arrival. For doctor visits, patients may have 1 support person age 54 or older with them. For treatment visits, patients can not have anyone with them due to social distancing guidelines and our immunocompromised population.

## 2021-03-05 NOTE — Progress Notes (Signed)
Michelle Holt,  [id number]   CLINIC:  Medical Oncology/Hematology  PCP:  Michelle Chroman, MD [address] / [place] [address] [phone]   REASON FOR VISIT:  Follow-up for high-grade serous ovarian cancer  PRIOR THERAPY:  1. Carboplatin and paclitaxel x 7 cycles from 08/24/2018 to 03/15/2019. 2. Laparoscopic TAH & BSO & omenectomy on 12/24/2018.  NGS Results: not done  CURRENT THERAPY: surveillance  BRIEF ONCOLOGIC HISTORY:  Oncology History  Ovarian cancer, bilateral ([place])  07/07/2018 Pathology Results   PLEURAL FLUID, LEFT (SPECIMEN 1 OF 1, COLLECTED 07/07/18): - MALIGNANT CELLS CONSISTENT WITH ADENOCARCINOMA - SEE COMMENT  Source Pleural Fluid, (Specimen 1 of 1, collected on 07/07/2018) Gross Specimen: Received is/are 1000cc of bloody red fluid with tissue. (TC:tc) Prepared: # Smears: 0 # Concentration Technique Slides (i.e. ThinPrep): 1 # Cell Block: 1 Conventional Additional Studies: Two Hematology slides labeled [id number] Comment The malignant cells are positive for cytokeratin 7, p53, WT-1, Pax-8, Moc31, ER (weak) and EMA but negative for cytokeratin 20, TTF-1, GATA-3, CDX-2 and D2-40. Overall, the phenotype is consistent with a gynecologic primary; clinical correlation recommended.   07/07/2018 Procedure   Successful ultrasound guided left thoracentesis yielding 2.0 L of pleural fluid   07/08/2018 Procedure   1. Technically successful placement of left 14 French pigtail chest drain, placed to Pleur-evac water-seal.   07/08/2018 Procedure   1. Technically successful five [place] double lumen power injectable PICC placement   07/09/2018 Imaging   Ct chest 1. There is a moderate, loculated left hydropneumothorax with a small air component and moderate fluid component. The largest loculated component is located posteriorly. There is a pigtail drainage catheter about the lateral pleural space. There is no obvious etiology, such as  obvious mass or pleural disease.   2. There is a small right pleural effusion with associated atelectasis or consolidation and a subpleural consolidation of the superior segment right lower lobe (series 4, image 56), of uncertain significance, possibly infectious or inflammatory   07/10/2018 Imaging   Ct abdomen and pelvis: 1. The bilateral ovaries are enlarged by heterogeneous appearing cystic lesions, measuring 5.3 x 4.2 cm on the right (series 4, image 72) and 4.5 x 3.2 cm on the left (series 4, image 75). Consider dedicated pelvic ultrasound and/or pelvic MRI to further evaluate for solid components given high suspicion for GYN primary malignancy.   2. No other evidence of mass and no lymphadenopathy in the abdomen or pelvis.   3. Trace ascites. There is some suggestion of omental and peritoneal nodularity (e.g. Series 4, image 61), concerning for peritoneal metastatic disease.    4. Loculated left-sided pleural effusion with left-sided pleural drainage catheter in position. Small right pleural effusion   07/13/2018 Surgery   OPERATION: 1.  Left VATS (video-assisted thoracoscopic surgery) for drainage of loculated pleural effusion. 2.  Talc pleurodesis for malignant pleural effusion. 3.  Placement of PleurX catheter for management of malignant pleural effusion. 4.  Placement of On-Q analgesia catheter system.    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   07/13/2018 Pathology Results   Pleura, peel, Left Pleural - FIBRO-FIBRINOUS PLEURITIS - NEGATIVE FOR MALIGNANCY   07/18/2018 Initial Diagnosis   Ovarian cancer, bilateral ([place])   07/20/2018 Procedure   EGD impression: Normal proximal esophagus and mid esophagus. Mild distal  esophageal rings; dilation not performed because of esophagitis. LA Grade  C reflux esophagitis. Z-line regular, 30 cm from the incisors. 5 cm hiatal hernia. Non-bleeding gastric ulcer with no stigmata of bleeding. Gastritis. Duodenal erosions without bleeding. Normal second portion of the duodenum. No specimens collected.   08/19/2018 Genetic Testing   Negative genetic testing on the common hereditary cancer panel.  The Common Hereditary Gene Panel offered by [facility] includes sequencing and/or deletion duplication testing of the following 48 genes: APC, ATM, AXIN2, BARD1, BMPR1A, BRCA1, BRCA2, BRIP1, CDH1, CDK4, CDKN2A (p14ARF), CDKN2A (p16INK4a), CHEK2, CTNNA1, DICER1, EPCAM (Deletion/duplication testing only), GREM1 (promoter region deletion/duplication testing only), KIT, MEN1, MLH1, MSH2, MSH3, MSH6, MUTYH, NBN, NF1, NHTL1, PALB2, PDGFRA, PMS2, POLD1, POLE, PTEN, RAD50, RAD51C, RAD51D, RNF43, SDHB, SDHC, SDHD, SMAD4, SMARCA4. STK11, TP53, TSC1, TSC2, and VHL.  The following genes were evaluated for sequence changes only: SDHA and HOXB13 c.251G>A variant only. The report date is August 19, 2018.   08/24/2018 - 03/15/2019 Chemotherapy   Patient is on Treatment Plan : OVARIAN Carboplatin (AUC 6) / Paclitaxel (175) q21d x 6 cycles     03/05/2021 -  Chemotherapy   Patient is on Treatment Plan : OVARIAN Carboplatin (AUC 6) / Paclitaxel (175) q21d x 6 cycles       CANCER STAGING:  Cancer Staging  No matching staging information was found for the patient.  INTERVAL HISTORY:  [name], a 51 y.o. female, returns for routine follow-up and consideration for next cycle of chemotherapy. Michelle Holt was last seen on 02/06/2021.  Due for cycle #1 of Carboplatin and Taxol today.   Overall, she tells me she has been feeling pretty well. She reports continues left sided abdominal pain which has worsened starting last week. She is taking 4-5 oxycodone tablets daily. She reports bloating and abdominal distention. She reports improved energy. She also reports urinary incontinence,  nausea, and vomiting. She reports tingling at night in her hands. Her appetite is poor, and she fills up easily while eating.   Overall, she feels ready for next cycle of chemo today.    REVIEW OF SYSTEMS:  Review of Systems  Constitutional:  Positive for appetite change. Negative for fatigue.  HENT:   Positive for trouble swallowing.   Respiratory:  Positive for shortness of breath.   Gastrointestinal:  Positive for abdominal distention, abdominal pain (L side), nausea and vomiting.  Genitourinary:  Positive for bladder incontinence.   Neurological:  Positive for headaches.  All other systems reviewed and are negative.  PAST MEDICAL/SURGICAL HISTORY:  Past Medical History:  Diagnosis Date   Anemia    Anxiety and depression    Arthritis of facet joints at multiple vertebral levels    L5-S1   Constipation    Dyslipidemia    Family history of breast cancer    Family history of uterine cancer    GERD (gastroesophageal reflux disease)    History of hiatal hernia    History of kidney stones    Insomnia    Irritable bowel syndrome    Migraine    Muscle tension headache    Neuropathy of finger    Ovarian carcinoma ([facility])    ovarian   Plantar fasciitis of right foot    Port-A-Cath in place 08/20/2018   Past Surgical History:  Procedure Laterality Date   ABDOMINAL HYSTERECTOMY     BIOPSY  10/27/2020   Procedure: BIOPSY;  Surgeon: Dolores Frame, MD;  [facility];  Service: Gastroenterology;;  CHOLECYSTECTOMY  2008   COLONOSCOPY N/A 08/13/2013   Procedure: COLONOSCOPY;  Surgeon: Malissa Hippo, MD;  [facility]: [facility];  Service: Endoscopy;  Laterality: N/A;  230-moved to 145 Ann to notify pt   COLONOSCOPY WITH PROPOFOL N/A 11/15/2020   Procedure: COLONOSCOPY WITH PROPOFOL;  Surgeon: Malissa Hippo, MD;  [facility]: [facility];  Service: Endoscopy;  Laterality: N/A;  1:40   ESOPHAGEAL DILATION N/A 02/28/2021   Procedure: ESOPHAGEAL DILATION;   Surgeon: Malissa Hippo, MD;  [facility];  Service: Endoscopy;  Laterality: N/A;   ESOPHAGOGASTRODUODENOSCOPY     ESOPHAGOGASTRODUODENOSCOPY (EGD) WITH PROPOFOL N/A 07/20/2018   Procedure: ESOPHAGOGASTRODUODENOSCOPY (EGD) WITH PROPOFOL;  Surgeon: Malissa Hippo, MD;  [facility];  Service: Endoscopy;  Laterality: N/A;  Possible esophageal dilation.   ESOPHAGOGASTRODUODENOSCOPY (EGD) WITH PROPOFOL N/A 10/27/2020   Procedure: ESOPHAGOGASTRODUODENOSCOPY (EGD) WITH PROPOFOL;  Surgeon: Dolores Frame, MD;  [facility];  Service: Gastroenterology;  Laterality: N/A;  2:10, pt knows to arrive at 10:15   ESOPHAGOGASTRODUODENOSCOPY (EGD) WITH PROPOFOL N/A 02/28/2021   Procedure: ESOPHAGOGASTRODUODENOSCOPY (EGD) WITH PROPOFOL;  Surgeon: Malissa Hippo, MD;  [facility];  Service: Endoscopy;  Laterality: N/A;  200 ASA 1   GIVENS CAPSULE STUDY N/A 11/24/2020   Procedure: GIVENS CAPSULE STUDY;  Surgeon: Malissa Hippo, MD;  [facility];  Service: Endoscopy;  Laterality: N/A;  7:30   IR ANGIOGRAM SELECTIVE EACH ADDITIONAL VESSEL  08/01/2018   IR ANGIOGRAM VISCERAL SELECTIVE  08/01/2018   IR EMBO ART  VEN HEMORR LYMPH EXTRAV  INC GUIDE ROADMAPPING  08/01/2018   IR IMAGING GUIDED PORT INSERTION  08/20/2018   IR PERC PLEURAL DRAIN W/INDWELL CATH W/IMG GUIDE  07/08/2018   IR THORACENTESIS ASP PLEURAL SPACE W/IMG GUIDE  07/07/2018   IR US GUIDE VASC ACCESS RIGHT  08/01/2018   PLEURAL EFFUSION DRAINAGE Left 07/13/2018   Procedure: DRAINAGE OF LOCULATED PLEURAL EFFUSION;  Surgeon: Kerin Perna, MD;  Location[facility] OR;  Service: Thoracic;  Laterality: Left;   POLYPECTOMY  11/15/2020   Procedure: POLYPECTOMY;  Surgeon: Malissa Hippo, MD;  [facility];  Service: Endoscopy;;   REMOVAL OF PLEURAL DRAINAGE CATHETER Left 08/20/2018   Procedure: REMOVAL OF PLEURAL DRAINAGE CATHETER;  Surgeon: Kerin Perna, MD;  Location: [facility] OR;  Service:  Thoracic;  Laterality: Left;   REMOVAL OF PLEURAL DRAINAGE CATHETER Left 08/20/2018   Procedure: REMOVAL OF PLEURAL DRAINAGE CATHETER;  Surgeon: Kerin Perna, MD;  Location: [facility] OR;  Service: Thoracic;  Laterality: Left;   TALC PLEURODESIS Left 07/13/2018   Procedure: Talc Pleuradesis;  Surgeon: Donata Clay, Theron Arista, MD;  Location: [facility] OR;  Service: Thoracic;  Laterality: Left;   TOTAL HIP ARTHROPLASTY Left 06/05/2020   Procedure: LEFT TOTAL HIP ARTHROPLASTY ANTERIOR APPROACH;  Surgeon: Tarry Kos, MD;  Location: [facility] OR;  Service: Orthopedics;  Laterality: Left;  3-C   TUBAL LIGATION Bilateral    UTERINE ABLATION     VIDEO ASSISTED THORACOSCOPY Left 07/13/2018   Procedure: VIDEO ASSISTED THORACOSCOPY;  Surgeon: Kerin Perna, MD;  Location: [facility] OR;  Service: Thoracic;  Laterality: Left;    SOCIAL HISTORY:  Social History   Socioeconomic History   Marital status: Widowed    Spouse name: Not on file   Number of children: 2   Years of education: 2-College   Highest education level: Not on file  Occupational History    Employer: [employer]  Tobacco Use   Smoking status: Former    Packs/day: 0.50    Years: 17.00    Pack years: 8.50    Types: Cigarettes    Quit date: 06/22/2018    Years since quitting: 2.7   Smokeless tobacco: Never  Vaping Use   Vaping Use: Never used  Substance and Sexual Activity   Alcohol use: Not Currently    Alcohol/week: 1.0 standard drink    Types: 1 Glasses of wine per week   Drug use: No   Sexual activity: Not on file  Other Topics Concern   Not on file  Social History Narrative   Patient lives at home with her daughter.    Patient has 2 children.    Patient is widowed.    Patient is right handed.    Patient has her Associates degree.      Social Determinants of Health   Financial Resource Strain: Not on file  Food Insecurity: Not on file  Transportation Needs: Not on file  Physical Activity: Not on file  Stress: Not on file  Social Connections:  Not on file  Intimate Partner Violence: Not on file    FAMILY HISTORY:  Family History  Problem Relation Age of Onset   Hypertension Mother    Obesity Mother    Diabetes Mother    Kidney disease Mother    Peripheral vascular disease Father    Atrial fibrillation Father    COPD Brother    Osteoporosis Brother    Crohn's disease Sister    Uterine cancer Sister 57       maternal half sister   Breast cancer Maternal Aunt 78   Colon cancer Neg Hx     CURRENT MEDICATIONS:  Current Outpatient Medications  Medication Sig Dispense Refill   albuterol (VENTOLIN HFA) 108 (90 Base) MCG/ACT inhaler Inhale 2 puffs into the lungs every 6 (six) hours as needed for wheezing or shortness of breath.     B Complex-C (B-COMPLEX WITH VITAMIN C) tablet Take 1 tablet by mouth daily. (Patient not taking: Reported on 02/20/2021)     Cholecalciferol (VITAMIN D) 50 MCG (2000 UT) CAPS Take 2,000 Units by mouth daily. (Patient not taking: Reported on 02/20/2021)     diazepam (VALIUM) 5 MG tablet TAKE ONE TABLET BY MOUTH AT BEDTIME 30 tablet 5   docusate sodium (COLACE) 100 MG capsule Take 1 capsule (100 mg total) by mouth daily as needed. To be taken after surgery 30 capsule 2   gabapentin (NEURONTIN) 100 MG capsule TAKE TWO (2) CAPSULES BY MOUTH AT BEDTIME 60 capsule 2   lidocaine-prilocaine (EMLA) cream Apply 1 application topically as needed (pain).     Magnesium 500 MG TABS Take 500 mg by mouth in the morning.     metoCLOPramide (REGLAN) 10 MG tablet Take 1 tablet (10 mg total) by mouth 3 (three) times daily before meals. 90 tablet 1   omeprazole (PRILOSEC) 40 MG capsule Take 1 capsule (40 mg total) by mouth 2 (two) times daily before a meal. 60 capsule 5   ondansetron (ZOFRAN) 4 MG tablet Take 1 tablet (4 mg total) by mouth every 8 (eight) hours as needed for nausea or vomiting. 40 tablet 3   Oxycodone HCl 20 MG TABS Take 1 tablet (20 mg total) by mouth every 4 (four) hours as needed. 180 tablet 0    sennosides-docusate sodium (SENOKOT-S) 8.6-50 MG tablet Take 1 tablet by mouth daily as needed for constipation.     SUMAtriptan (  IMITREX) 100 MG tablet TAKE ONE TABLET BY MOUTH PRN UP to TWICE DAILY AS NEEDED. 9 tablet 5   tiZANidine (ZANAFLEX) 4 MG tablet Take 1 tablet (4 mg total) by mouth 3 (three) times daily as needed for muscle spasms. 60 tablet 0   triamcinolone cream (KENALOG) 0.1 % Apply 1 application topically 2 (two) times daily as needed (rash). 80 g 11   venlafaxine XR (EFFEXOR-XR) 150 MG 24 hr capsule TAKE ONE CAPSULE BY MOUTH DAILY WITH BREAKFAST 30 capsule 6   No current facility-administered medications for this visit.   Facility-Administered Medications Ordered in Other Visits  Medication Dose Route Frequency Provider Last Rate Last Admin   0.9 %  sodium chloride infusion   Intravenous Continuous Derek Jack, MD   Stopped at 10/23/20 1547   sodium chloride flush (NS) 0.9 % injection 10 mL  10 mL Intravenous PRN Derek Jack, MD   10 mL at 10/23/20 1544    ALLERGIES:  Allergies  Allergen Reactions   Morphine And Related Itching   Nickel Itching   Nortriptyline Other (See Comments)    Significant weight gain   Topamax [Topiramate] Diarrhea and Nausea Only   Xanax [Alprazolam]     Can't wake up    Actifed Cold-Allergy [Chlorpheniramine-Phenyleph Er] Rash   Amoxicillin Rash    Did it involve swelling of the face/tongue/throat, SOB, or low BP? Unknown Did it involve sudden or severe rash/hives, skin peeling, or any reaction on the inside of your mouth or nose? Unknown Did you need to seek medical attention at a hospital or doctor's office? Unknown When did it last happen? teenager       If all above answers are "NO", may proceed with cephalosporin use.    Codeine Hives   Erythromycin Rash   Penicillins Rash    Did it involve swelling of the face/tongue/throat, SOB, or low BP? Unknown Did it involve sudden or severe rash/hives, skin peeling, or any  reaction on the inside of your mouth or nose? Unknown Did you need to seek medical attention at a hospital or doctor's office? Unknown When did it last happen? teenager       If all above answers are "NO", may proceed with cephalosporin use.    Sudafed [Pseudoephedrine Hcl] Rash    PHYSICAL EXAM:  Performance status (ECOG): 1 - Symptomatic but completely ambulatory  There were no vitals filed for this visit. Wt Readings from Last 3 Encounters:  02/28/21 181 lb (82.1 kg)  02/20/21 181 lb (82.1 kg)  02/06/21 180 lb (81.6 kg)   Physical Exam Vitals reviewed.  Constitutional:      Appearance: Normal appearance.  Cardiovascular:     Rate and Rhythm: Normal rate and regular rhythm.     Pulses: Normal pulses.     Heart sounds: Normal heart sounds.  Pulmonary:     Effort: Pulmonary effort is normal.     Breath sounds: Examination of the right-lower field reveals decreased breath sounds. Decreased breath sounds present.  Abdominal:     General: There is distension.     Palpations: Abdomen is soft. There is no mass.     Tenderness: There is no abdominal tenderness.  Musculoskeletal:     Right lower leg: No edema.     Left lower leg: No edema.  Neurological:     General: No focal deficit present.     Mental Status: She is alert and oriented to person, place, and time.  Psychiatric:  Mood and Affect: Mood normal.        Behavior: Behavior normal.    LABORATORY DATA:  I have reviewed the labs as listed.  CBC Latest Ref Rng & Units 02/28/2021 02/05/2021 12/12/2020  WBC 4.0 - 10.5 K/uL 12.4(H) 11.4(H) 6.3  Hemoglobin 12.0 - 15.0 g/dL 12.6 13.9 13.5  Hematocrit 36.0 - 46.0 % 39.7 41.6 41.4  Platelets 150 - 400 K/uL 439(H) 386 327   CMP Latest Ref Rng & Units 02/05/2021 12/12/2020 11/16/2020  Glucose 70 - 99 mg/dL 97 115(H) 138(H)  BUN 6 - 20 mg/dL $Remove'12 10 8  'otzWYPX$ Creatinine 0.44 - 1.00 mg/dL 0.60 0.68 0.70  Sodium 135 - 145 mmol/L 133(L) 137 141  Potassium 3.5 - 5.1 mmol/L 3.9  3.4(L) 3.7  Chloride 98 - 111 mmol/L 99 102 107  CO2 22 - 32 mmol/L $RemoveB'26 26 27  'WkgFquHz$ Calcium 8.9 - 10.3 mg/dL 8.7(L) 8.9 8.9  Total Protein 6.5 - 8.1 g/dL 7.0 7.3 6.1(L)  Total Bilirubin 0.3 - 1.2 mg/dL 0.5 0.5 0.5  Alkaline Phos 38 - 126 U/L 137(H) 116 102  AST 15 - 41 U/L 32 34 21  ALT 0 - 44 U/L $Remo'29 28 16    'DGCgT$ DIAGNOSTIC IMAGING:  I have independently reviewed the scans and discussed with the patient. CT Abdomen Pelvis W Contrast  Result Date: 02/22/2021 CLINICAL DATA:  Ovarian cancer. Recurrence. Elevated CA 125 level. Completed therapy 2 years ago. Abdominal cramping with back pain. Blood in stools with nausea. EXAM: CT ABDOMEN AND PELVIS WITH CONTRAST TECHNIQUE: Multidetector CT imaging of the abdomen and pelvis was performed using the standard protocol following bolus administration of intravenous contrast. RADIATION DOSE REDUCTION: This exam was performed according to the departmental dose-optimization program which includes automated exposure control, adjustment of the mA and/or kV according to patient size and/or use of iterative reconstruction technique. CONTRAST:  146mL OMNIPAQUE IOHEXOL 300 MG/ML  SOLN COMPARISON:  12/06/2020 FINDINGS: Lower chest: Clear lung bases. Small right pleural effusion, new. Mild cardiomegaly. Moderate to large hiatal hernia. Trace ascites extending adjacent the herniated stomach on 11/02, new. Hepatobiliary: Normal liver. Cholecystectomy, without biliary ductal dilatation. Pancreas: Normal, without mass or ductal dilatation. Splenic artery embolization coils in the region of the pancreatic tail. Spleen: Chronic hematoma adjacent the superior aspect of the spleen measures 6.8 x 5.4 cm on 19/2 versus 7.2 x 5.6 cm on the prior exam. No splenomegaly. Adrenals/Urinary Tract: Normal adrenal glands. Interpolar left renal 1.2 cm lesion measures slightly greater than fluid density on 18/7 and is similar in size to on the prior exam. Degraded evaluation of the pelvis, secondary to  beam hardening artifact from left hip arthroplasty. The bladder is decompressed. Stomach/Bowel: Distal gas and stool. Normal colon and terminal ileum. Normal small bowel caliber. Vascular/Lymphatic: Aortic and branch vessel atherosclerosis. Aortic atherosclerosis. Left external iliac node measures 1.3 x 1.3 cm on 72/2 versus 1.3 x 1.1 cm on the prior exam. Reproductive: Hysterectomy.  No adnexal mass. Other: Progressive peritoneal metastasis. For example, a new area of left abdominal soft tissue thickening without well-defined mass on 29/2. Subtle soft tissue thickening about the serosa of the anterior cecum including on images 61 and 68 of series 2, new. Development of trace abdominopelvic ascites with increase in pelvic peritoneal thickening, including on 72/2. Trace interloop fluid and mesenteric thickening, new. No free intraperitoneal air. Musculoskeletal: Left hip arthroplasty. IMPRESSION: 1. Progressive peritoneal metastasis since 12/06/2020. No bowel obstruction or other acute complication. 2. New small right pleural effusion. 3.  Mild enlargement of an isolated left external iliac node, suspicious for progressive nodal metastasis. 4. Moderate to large hiatal hernia. 5.  Aortic Atherosclerosis (ICD10-I70.0). 6. Similar to slight decrease in size of a perisplenic hematoma. Electronically Signed   By: Abigail Miyamoto M.D.   On: 02/22/2021 11:21     ASSESSMENT:  1.  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 -Robotic assisted laparoscopic TAH and BSO and omentectomy on 12/24/2018, pathology showing high-grade serous carcinoma, PT3P NX. -Germline mutation testing was negative. -3 more cycles of adjuvant chemotherapy completed on 03/15/2019. -CTAP on 04/13/2019 showed no findings of active malignancy.  28% reduction in the volume of presumed chronic hematoma/chronic fluid collection  splaying the upper margin of the spleen.  Large type III hiatal hernia. -CTAP on 10/13/2019 shows no findings of recurrence or metastatic disease. - Foundation 1 shows HRD+, LOH score>16%.  MSI-stable.  MYC amplification.  T p53 mutation.   PLAN:  1.  Clinical stage IVa high-grade serous ovarian cancer, positive cytology of left pleural effusion: - We reviewed CT CAP from 02/21/2021 which showed progressive peritoneal metastasis compared to 12/06/2020 scan.  No bowel obstruction.  Small right pleural effusion with mild enlargement of an isolated left external iliac lymph node. - Reviewed EGD from 02/28/2021 which showed hiatal hernia and normal findings. - She reported epigastric and left upper quadrant pain worse in the last 1 week which is related to progression of her malignancy. - We reviewed labs from today which showed elevated alkaline phosphatase and rest of the LFTs are normal.  CBC was grossly within normal limits.  Last CA125 was 288 on 02/06/2020. - We will plan to start her back on carboplatin and paclitaxel today with plans to repeat tumor markers as well as CT scans after 3-4 cycles depending on tolerance. - She will be evaluated in the symptom management clinic next week for possible fluids.  I plan to see her back in 3 weeks for follow-up prior to cycle 2.   2.  Lower back/left upper quadrant pain: - She reported worsening pain in the epigastric region and left upper quadrant last week. - We have increased oxycodone to 20 mg, she is requiring 4 to 5 tablets daily.   3.  Peripheral neuropathy: -She reports tingling in the lateral 3 fingers at nighttime.  We will closely monitor as she is restarting paclitaxel.  4.  Iron deficiency state: - She received Feraheme on 02/12/2021 and 02/19/2021. - She felt better after the second infusion in terms of energy.  Hemoglobin today is 12.7.   Orders placed this encounter:  No orders of the defined types were placed in this  encounter.    Derek Jack, MD [place] [phone]   I, Thana Ates, am acting as a scribe for Dr. Derek Jack.  I, Derek Jack MD, have reviewed the above documentation for accuracy and completeness, and I agree with the above.

## 2021-03-05 NOTE — Progress Notes (Signed)
Patient presents today for treatment . C1D1 of Taxol/Carboplatin. Last treatment 03/16/2019. Vital signs within parameters for treatment. MAR reviewed and updated. Labs pending. Patient has complaints of pain in her abdomen, midsternum that radiates to the left side. Patient rates her pain 4/10. Patient took her pain medication prior to arrival.   Message received from A. Ouida Sills RN / Dr. Delton Coombes to proceed with treatment pending CMP results.   Labs within parameters for treatment today. Consent obtained and attestation 02/06/2021. Consent 03/05/2021.   Taxol/Carboplatin given today per MD orders. Tolerated infusion without adverse affects. Vital signs stable. No complaints at this time. Discharged from clinic ambulatory in stable condition. Alert and oriented x 3. F/U with [facility] as scheduled.    16:56 pm Called patient. Advised patient of provider's approval for requested Decadron 4mg  PO x 3 days after each treatment, Bentyl 10 mg PO at bedtime . Understanding verbalized.

## 2021-03-05 NOTE — Progress Notes (Signed)
Patient has been examined by Dr. Katragadda, and vital signs and labs have been reviewed. ANC, Creatinine, LFTs, hemoglobin, and platelets are within treatment parameters per M.D. - pt may proceed with treatment.    °

## 2021-03-06 ENCOUNTER — Encounter (HOSPITAL_COMMUNITY): Payer: Self-pay | Admitting: *Deleted

## 2021-03-06 ENCOUNTER — Encounter (HOSPITAL_COMMUNITY): Payer: Self-pay

## 2021-03-06 LAB — CA 125: Cancer Antigen (CA) 125: 736 U/mL — ABNORMAL HIGH (ref 0.0–38.1)

## 2021-03-06 NOTE — Progress Notes (Signed)
24 hour chemotherapy follow-up call:   I spoke with patient via telephone.  She denies having any side effects at this time. She did say she was surprised that she hasn't had any nausea.  She has medication at home and knows how to take it should she develop any nausea/vomiting.  She said that she has been a little tired but she expected that.  She denies needing anything at this time but verbalizes understanding to call the clinic should anything arise.

## 2021-03-07 ENCOUNTER — Other Ambulatory Visit (HOSPITAL_COMMUNITY): Payer: Self-pay | Admitting: *Deleted

## 2021-03-07 MED ORDER — METHYLPREDNISOLONE 4 MG PO TBPK: ORAL_TABLET | ORAL | 0 refills | Status: DC

## 2021-03-07 NOTE — Telephone Encounter (Signed)
Patient advised per my chart message that she has developed a rash following treatment, along with photos.  Was reviewed by Dr. Delton Coombes and medrol dose pack sent to pharmacy.  Patient made aware.

## 2021-03-08 ENCOUNTER — Other Ambulatory Visit (HOSPITAL_COMMUNITY): Payer: Self-pay

## 2021-03-08 ENCOUNTER — Encounter (HOSPITAL_COMMUNITY): Payer: Self-pay

## 2021-03-08 MED ORDER — LACTULOSE 10 GM/15ML PO SOLN: 10.0000 g | ORAL | 0 refills | Status: DC | PRN

## 2021-03-11 NOTE — Progress Notes (Signed)
[facility] [address], [place] [address] Phone: [phone] Fax: [place] PROGRESS NOTE   Michelle Holt [id number] 1970-06-10 51 y.o.  Michelle Holt is managed by Dr. Delton Coombes for high-grade serous ovarian cancer, stage IV with peritoneal metastases and malignant left pleural effusion.  Actively treated with chemotherapy/immunotherapy/hormonal therapy: YES  Current therapy: Carboplatin + paclitaxel  Last treated: 03/05/2021  Next scheduled appointment with provider: 03/26/2021  Subjective:  Chief Complaint: Chemotherapy follow-up  Michelle Holt is managed by Dr. Delton Coombes for high-grade serous ovarian cancer, stage IV with peritoneal metastases and malignant left pleural effusion.  She received 4 cycles of carboplatin/paclitaxel in 2020 and had TAH/BSO/omentectomy in December 2020.  She underwent 3 more cycles of adjuvant chemotherapy completed in March 2021.  She was determined to have recurrent ovarian cancer in December 2022.  She initially elected for short-term surveillance, but after CT scan in February 2023 showed disease progression, she elected to begin chemotherapy again.  She had her first cycle of carboplatin and paclitaxel on 03/05/2021.  She  reports that she  tolerated her  chemotherapy infusions fairly well apart from some mild nausea.  Her biggest side effect from her chemotherapy was constipation.  She reports that she went 4 days without a bowel movement, but after several doses of lactulose, she was able to have a good bowel movement.  She is currently taking lactulose once daily for maintenance of her bowel movements, with good results.  She  has 50% energy and 50% appetite.  Her  weight has been relatively stable with the range of 180 to 185 pounds.  She complains of ongoing fatigue, reports that she is easily wiped out.  She is eating about 50% of her normal dietary intake and supplements  this with a protein shake once or twice daily.  She drinks water "nonstop," 64+ ounces of water per day.  She had some residual neuropathy from her first round of chemotherapy, primarily located in the lateral 3 fingers and mostly at nighttime.  She reports that this is stable after her most recent chemotherapy, denies any worsening neuropathy.  She continues to receive relief from gabapentin.  She developed rash to her bilateral forearms after chemotherapy last week.  Rash is flat/not raised, nonpruritic, nonpainful.  (Reticular pattern noted on exam.)  She was given a trial of steroid treatment, but without any improvement in the rash.  She reports that she had a similar rash in 2020, prior to her diagnosis with cancer.  At that time, it was once again only on her forearms and seem to be sun sensitive.  Regarding her cancer-associated pain of her epigastric region and left upper quadrant, she continues to have intermittent cramping abdominal pain.  She has been taking Bentyl and oxycodone 20 mg (5 to 6 tablets/day), with some relief.  Pain is 10/10 at maximum down to 2/10 after medication.  She reports that her pain is "well enough" managed at this time, and she does not want any increase in her pain medications for the time being.  She has night sweats but denies any fever or chills. She continues to have mild nausea, but denies any vomiting.  No diarrhea.  No mouth sores. She denies any peripheral edema. She denies any changes in her urine output. No chest pain, shortness of breath, or dyspnea on exertion. She denies any new pain.  She denies any bruising or bleeding. She denies any B symptoms  Review of Systems:  Review of Systems  Constitutional:  Positive for diaphoresis and fatigue. Negative for activity change, appetite change, chills, fever and unexpected weight change.  HENT:  Negative for mouth sores, nosebleeds, sore throat and trouble swallowing.   Respiratory:  Negative for cough  and shortness of breath.   Cardiovascular:  Negative for chest pain, palpitations and leg swelling.  Gastrointestinal:  Positive for abdominal pain, constipation and nausea. Negative for blood in stool, diarrhea and vomiting.  Genitourinary:  Negative for dysuria and hematuria.  Skin:  Positive for rash (non-painful, non-pruritic).  Neurological:  Positive for numbness and headaches. Negative for dizziness and light-headedness.  Psychiatric/Behavioral:  Negative for dysphoric mood and sleep disturbance. The patient is not nervous/anxious.     Past Medical History, Surgical history, Social history, and Family history were reviewed as documented elsewhere in chart, and were updated as appropriate.   Assessment & Plan:    1.  Stage IV high-grade serous ovarian cancer - Managed by Dr. Delton Coombes for high-grade serous ovarian cancer, stage IV with peritoneal metastases and malignant left pleural effusion. - She received 4 cycles of carboplatin/paclitaxel in 2020 and had TAH/BSO/omentectomy in December 2020. - She underwent 3 more cycles of adjuvant chemotherapy completed in March 2021. - She was determined to have recurrent ovarian cancer in December 2022.  She initially elected for short-term surveillance, but after CT scan in February 2023 showed disease progression, she elected to begin chemotherapy again. - She had her first cycle of carboplatin and paclitaxel on 03/05/2021. - Labs today (03/12/2021) show stable CBC.  CMP notable for increased BUN 28, but normal creatinine 0.71.  LFTs increased with AST 45, ALT 77, and alk phos 139.  Magnesium 1.3. - PLAN: We will hold off on IV fluids for today, but patient will further increase her hydration at home. - Her next scheduled appoint with Dr. Delton Coombes is on 03/26/2021 for repeat labs, MD visit, and next cycle of chemotherapy   2.  Cancer-associated pain - Cancer-associated pain of her epigastric region and left upper quadrant - continues to have  intermittent cramping abdominal pain. - Pain is 10/10 at maximum down to 2/10 after medication.   - She has been taking Bentyl and oxycodone 20 mg (5 to 6 tablets/day), with some relief.  She reports that her pain is "well enough" managed at this time, and she does not want any increase in her pain medications for the time being. - PLAN: Continue oxycodone 20 mg every 4 hours as needed, as well as Bentyl. - Patient is aware that she can call the clinic if her pain is suboptimally controlled and if she feels that she requires adjustment of her pain medications. - She has been educated about risk of increased lethargy and altered mental status while on narcotic pain medications.  She knows to seek immediate medical attention should these symptoms occur.  3.  Chemo-induced neuropathy - She had some residual neuropathy from her first round of chemotherapy, primarily located in the lateral 3 fingers and mostly at nighttime.  She reports that this is stable after her most recent chemotherapy, denies any worsening neuropathy.  She continues to receive relief from gabapentin. - PLAN: Continue gabapentin.  4.  Reticular rash on bilateral forearms - She developed rash to her bilateral forearms after chemotherapy last week.  Rash is flat, reticular pattern, nonpruritic, nonpainful. - She was given a trial of oral steroid treatment, but without any improvement in the rash. - She reports that  she had a similar rash in 2020, prior to her diagnosis with cancer.  At that time, it was once again only on her forearms and seemed to be sun sensitive - PLAN: Discussed with Dr. Delton Coombes, who believes rash is likely related to paclitaxel chemotherapy. - No treatment of rash needed at this time.  We will continue to watch and wait. - If rash becomes painful or pruritic, will refer to dermatology for further evaluation and treatment suggestions. - Patient knows to seek immediate medical attention if she has any  blistering of the skin or worsening rash.  5.  Hypomagnesemia - Magnesium today was 1.3 - She was previously taking magnesium 500 mg daily, but had stopped all of her vitamins and supplements due to being on chemotherapy - PLAN: Patient instructed to take magnesium 500 mg twice daily x3 days, and thereafter to resume 500 mg daily.  6.  Constipation - She reports that she went 4 days without a bowel movement, but after several doses of lactulose, she was able to have a good bowel movement.  She is currently taking lactulose once daily for maintenance of her bowel movements, with good results. - PLAN: Continue lactulose once daily.   Objective:   Physical Exam:  LMP  (LMP Unknown)  ECOG: 2  Physical Exam Constitutional:      Appearance: Normal appearance. She is obese.     Comments: Appears uncomfortable secondary to pain  HENT:     Head: Normocephalic and atraumatic.     Mouth/Throat:     Mouth: Mucous membranes are moist.  Eyes:     Extraocular Movements: Extraocular movements intact.     Pupils: Pupils are equal, round, and reactive to light.  Cardiovascular:     Rate and Rhythm: Regular rhythm. Tachycardia present.     Pulses: Normal pulses.     Heart sounds: Normal heart sounds.     Comments: Heart rate 92 Pulmonary:     Effort: Pulmonary effort is normal.     Breath sounds: Normal breath sounds.  Abdominal:     General: Bowel sounds are normal.     Palpations: Abdomen is soft.     Tenderness: There is abdominal tenderness.  Musculoskeletal:        General: No swelling.     Right lower leg: No edema.     Left lower leg: No edema.  Lymphadenopathy:     Cervical: No cervical adenopathy.  Skin:    General: Skin is warm and dry.     Findings: Rash (Flat reticular rash of bilateral forearms) present.  Neurological:     General: No focal deficit present.     Mental Status: She is alert and oriented to person, place, and time.  Psychiatric:        Mood and Affect:  Mood normal.        Behavior: Behavior normal.    Lab Review:     Component Value Date/Time   NA 137 03/05/2021 0808   K 3.9 03/05/2021 0808   CL 100 03/05/2021 0808   CO2 28 03/05/2021 0808   GLUCOSE 98 03/05/2021 0808   BUN 12 03/05/2021 0808   CREATININE 0.62 03/05/2021 0808   CALCIUM 9.0 03/05/2021 0808   PROT 6.6 03/05/2021 0808   ALBUMIN 3.2 (L) 03/05/2021 0808   AST 30 03/05/2021 0808   ALT 21 03/05/2021 0808   ALKPHOS 155 (H) 03/05/2021 0808   BILITOT 0.7 03/05/2021 0808   GFRNONAA >60 03/05/2021 3419  GFRAA >60 10/07/2019 1038       Component Value Date/Time   WBC 11.2 (H) 03/05/2021 0808   RBC 3.60 (L) 03/05/2021 0808   HGB 12.7 03/05/2021 0808   HCT 38.5 03/05/2021 0808   PLT 437 (H) 03/05/2021 0808   MCV 106.9 (H) 03/05/2021 0808   MCH 35.3 (H) 03/05/2021 0808   MCHC 33.0 03/05/2021 0808   RDW 13.6 03/05/2021 0808   LYMPHSABS 1.5 03/05/2021 0808   MONOABS 1.1 (H) 03/05/2021 0808   EOSABS 0.3 03/05/2021 0808   BASOSABS 0.1 03/05/2021 0808   -------------------------------  Imaging from last 24 hours (if applicable):  Radiology interpretation: CT Abdomen Pelvis W Contrast  Result Date: 02/22/2021 CLINICAL DATA:  Ovarian cancer. Recurrence. Elevated CA 125 level. Completed therapy 2 years ago. Abdominal cramping with back pain. Blood in stools with nausea. EXAM: CT ABDOMEN AND PELVIS WITH CONTRAST TECHNIQUE: Multidetector CT imaging of the abdomen and pelvis was performed using the standard protocol following bolus administration of intravenous contrast. RADIATION DOSE REDUCTION: This exam was performed according to the departmental dose-optimization program which includes automated exposure control, adjustment of the mA and/or kV according to patient size and/or use of iterative reconstruction technique. CONTRAST:  165m OMNIPAQUE IOHEXOL 300 MG/ML  SOLN COMPARISON:  12/06/2020 FINDINGS: Lower chest: Clear lung bases. Small right pleural effusion, new. Mild  cardiomegaly. Moderate to large hiatal hernia. Trace ascites extending adjacent the herniated stomach on 11/02, new. Hepatobiliary: Normal liver. Cholecystectomy, without biliary ductal dilatation. Pancreas: Normal, without mass or ductal dilatation. Splenic artery embolization coils in the region of the pancreatic tail. Spleen: Chronic hematoma adjacent the superior aspect of the spleen measures 6.8 x 5.4 cm on 19/2 versus 7.2 x 5.6 cm on the prior exam. No splenomegaly. Adrenals/Urinary Tract: Normal adrenal glands. Interpolar left renal 1.2 cm lesion measures slightly greater than fluid density on 18/7 and is similar in size to on the prior exam. Degraded evaluation of the pelvis, secondary to beam hardening artifact from left hip arthroplasty. The bladder is decompressed. Stomach/Bowel: Distal gas and stool. Normal colon and terminal ileum. Normal small bowel caliber. Vascular/Lymphatic: Aortic and branch vessel atherosclerosis. Aortic atherosclerosis. Left external iliac node measures 1.3 x 1.3 cm on 72/2 versus 1.3 x 1.1 cm on the prior exam. Reproductive: Hysterectomy.  No adnexal mass. Other: Progressive peritoneal metastasis. For example, a new area of left abdominal soft tissue thickening without well-defined mass on 29/2. Subtle soft tissue thickening about the serosa of the anterior cecum including on images 61 and 68 of series 2, new. Development of trace abdominopelvic ascites with increase in pelvic peritoneal thickening, including on 72/2. Trace interloop fluid and mesenteric thickening, new. No free intraperitoneal air. Musculoskeletal: Left hip arthroplasty. IMPRESSION: 1. Progressive peritoneal metastasis since 12/06/2020. No bowel obstruction or other acute complication. 2. New small right pleural effusion. 3. Mild enlargement of an isolated left external iliac node, suspicious for progressive nodal metastasis. 4. Moderate to large hiatal hernia. 5.  Aortic Atherosclerosis (ICD10-I70.0). 6.  Similar to slight decrease in size of a perisplenic hematoma. Electronically Signed   By: KAbigail MiyamotoM.D.   On: 02/22/2021 11:21      Wrap-Up:    PLAN SUMMARY & DISPOSITION: Follow-up as scheduled on 03/26/2021, or sooner if needed based on symptoms.  All questions were answered. The patient knows to call the clinic with any problems, questions or concerns.  Medical decision making: Moderate  Time spent on visit: I spent 25 minutes counseling the patient face  to face. The total time spent in the appointment was 40 minutes and more than 50% was on counseling.   Harriett Rush, PA-C  03/12/2021 7:29 PM

## 2021-03-12 ENCOUNTER — Inpatient Hospital Stay (HOSPITAL_COMMUNITY): Payer: Medicare Other

## 2021-03-12 ENCOUNTER — Other Ambulatory Visit: Payer: Self-pay

## 2021-03-12 ENCOUNTER — Encounter (HOSPITAL_COMMUNITY): Payer: Self-pay | Admitting: Physician Assistant

## 2021-03-12 ENCOUNTER — Inpatient Hospital Stay (HOSPITAL_BASED_OUTPATIENT_CLINIC_OR_DEPARTMENT_OTHER): Payer: Medicare Other | Admitting: Physician Assistant

## 2021-03-12 DIAGNOSIS — Z5111 Encounter for antineoplastic chemotherapy: Secondary | ICD-10-CM | POA: Diagnosis not present

## 2021-03-12 DIAGNOSIS — K5903 Drug induced constipation: Secondary | ICD-10-CM

## 2021-03-12 DIAGNOSIS — C563 Malignant neoplasm of bilateral ovaries: Secondary | ICD-10-CM

## 2021-03-12 DIAGNOSIS — G620 Drug-induced polyneuropathy: Secondary | ICD-10-CM

## 2021-03-12 DIAGNOSIS — G893 Neoplasm related pain (acute) (chronic): Secondary | ICD-10-CM | POA: Diagnosis not present

## 2021-03-12 DIAGNOSIS — Z95828 Presence of other vascular implants and grafts: Secondary | ICD-10-CM

## 2021-03-12 DIAGNOSIS — C782 Secondary malignant neoplasm of pleura: Secondary | ICD-10-CM

## 2021-03-12 DIAGNOSIS — Z09 Encounter for follow-up examination after completed treatment for conditions other than malignant neoplasm: Secondary | ICD-10-CM | POA: Diagnosis not present

## 2021-03-12 DIAGNOSIS — T451X5A Adverse effect of antineoplastic and immunosuppressive drugs, initial encounter: Secondary | ICD-10-CM

## 2021-03-12 LAB — COMPREHENSIVE METABOLIC PANEL
ALT: 77 U/L — ABNORMAL HIGH (ref 0–44)
AST: 45 U/L — ABNORMAL HIGH (ref 15–41)
Albumin: 3.6 g/dL (ref 3.5–5.0)
Alkaline Phosphatase: 139 U/L — ABNORMAL HIGH (ref 38–126)
Anion gap: 7 (ref 5–15)
BUN: 28 mg/dL — ABNORMAL HIGH (ref 6–20)
CO2: 27 mmol/L (ref 22–32)
Calcium: 8.8 mg/dL — ABNORMAL LOW (ref 8.9–10.3)
Chloride: 97 mmol/L — ABNORMAL LOW (ref 98–111)
Creatinine, Ser: 0.71 mg/dL (ref 0.44–1.00)
GFR, Estimated: >60 mL/min (ref >60)
Glucose, Bld: 117 mg/dL — ABNORMAL HIGH (ref 70–99)
Potassium: 3.5 mmol/L (ref 3.5–5.1)
Sodium: 131 mmol/L — ABNORMAL LOW (ref 135–145)
Total Bilirubin: 0.3 mg/dL (ref 0.3–1.2)
Total Protein: 7.1 g/dL (ref 6.5–8.1)

## 2021-03-12 LAB — CBC WITH DIFFERENTIAL/PLATELET
Abs Immature Granulocytes: 0.09 10*3/uL — ABNORMAL HIGH (ref 0.00–0.07)
Basophils Absolute: 0.1 10*3/uL (ref 0.0–0.1)
Basophils Relative: 1 %
Eosinophils Absolute: 0.1 10*3/uL (ref 0.0–0.5)
Eosinophils Relative: 1 %
HCT: 38.1 % (ref 36.0–46.0)
Hemoglobin: 12.8 g/dL (ref 12.0–15.0)
Immature Granulocytes: 1 %
Lymphocytes Relative: 22 %
Lymphs Abs: 1.9 10*3/uL (ref 0.7–4.0)
MCH: 35.9 pg — ABNORMAL HIGH (ref 26.0–34.0)
MCHC: 33.6 g/dL (ref 30.0–36.0)
MCV: 106.7 fL — ABNORMAL HIGH (ref 80.0–100.0)
Monocytes Absolute: 0.3 10*3/uL (ref 0.1–1.0)
Monocytes Relative: 3 %
Neutro Abs: 6.3 10*3/uL (ref 1.7–7.7)
Neutrophils Relative %: 72 %
Platelets: 308 10*3/uL (ref 150–400)
RBC: 3.57 MIL/uL — ABNORMAL LOW (ref 3.87–5.11)
RDW: 13.1 % (ref 11.5–15.5)
WBC: 8.8 10*3/uL (ref 4.0–10.5)
nRBC: 0 % (ref 0.0–0.2)

## 2021-03-12 LAB — MAGNESIUM: Magnesium: 1.3 mg/dL — ABNORMAL LOW (ref 1.7–2.4)

## 2021-03-12 MED ORDER — HEPARIN SOD (PORK) LOCK FLUSH 100 UNIT/ML IV SOLN
500.0000 [IU] | Freq: Once | INTRAVENOUS | Status: AC
  Administered 2021-03-12: 500 [IU] via INTRAVENOUS

## 2021-03-12 MED ORDER — SODIUM CHLORIDE 0.9% FLUSH
10.0000 mL | INTRAVENOUS | Status: DC | PRN
  Administered 2021-03-12: 10 mL via INTRAVENOUS

## 2021-03-12 NOTE — Progress Notes (Signed)
Patients port flushed without difficulty.  Good blood return noted with no bruising or swelling noted at site.  Stable during access and blood draw.  Patient to remain accessed for possible fluids. 

## 2021-03-12 NOTE — Patient Instructions (Signed)
Michelle Holt at [facility] Discharge Instructions  You were seen today by Tarri Abernethy PA-C for your chemotherapy follow up.       FOLLOW-UP APPOINTMENT: You  are scheduled for repeat labs, office visit, and next cycle of treatment with Dr. Delton Coombes on 03/26/2021.     Thank you for choosing [place] at [facility] to provide your oncology and hematology care.  To afford each patient quality time with our provider, please arrive at least 15 minutes before your scheduled appointment time.   If you have a lab appointment with the [facility] please come in thru the Main Entrance and check in at the main information desk.  You need to re-schedule your appointment should you arrive 10 or more minutes late.  We strive to give you quality time with our providers, and arriving late affects you and other patients whose appointments are after yours.  Also, if you no show three or more times for appointments you may be dismissed from the clinic at the providers discretion.     Again, thank you for choosing [facility].  Our hope is that these requests will decrease the amount of time that you wait before being seen by our physicians.       _____________________________________________________________  Should you have questions after your visit to [facility], please contact our office at [phone] and follow the prompts.  Our office hours are 8:00 a.m. and 4:30 p.m. Monday - Friday.  Please note that voicemails left after 4:00 p.m. may not be returned until the following business day.  We are closed weekends and major holidays.  You do have access to a nurse 24-7, just call the main number to the clinic [phone] and do not press any options, hold on the line and a nurse will answer the phone.    For prescription refill requests, have your pharmacy contact our office and allow 72 hours.    Due to Covid, you will need  to wear a mask upon entering the hospital. If you do not have a mask, a mask will be given to you at the Main Entrance upon arrival. For doctor visits, patients may have 1 support person age 23 or older with them. For treatment visits, patients can not have anyone with them due to social distancing guidelines and our immunocompromised population.

## 2021-03-12 NOTE — Progress Notes (Signed)
No hydration needed today per Tarri Abernethy PA-C.

## 2021-03-20 ENCOUNTER — Other Ambulatory Visit (HOSPITAL_COMMUNITY): Payer: Self-pay | Admitting: *Deleted

## 2021-03-20 MED ORDER — LACTULOSE 10 GM/15ML PO SOLN: 10.0000 g | ORAL | 0 refills | Status: DC | PRN

## 2021-03-20 NOTE — Addendum Note (Signed)
Addendum  created 03/20/21 0858 by Orlie Dakin, CRNA  ? Intraprocedure Staff edited  ?  ?

## 2021-03-26 ENCOUNTER — Inpatient Hospital Stay (HOSPITAL_COMMUNITY): Payer: Medicare Other | Attending: Hematology | Admitting: Hematology

## 2021-03-26 ENCOUNTER — Inpatient Hospital Stay (HOSPITAL_COMMUNITY): Payer: Medicare Other

## 2021-03-26 ENCOUNTER — Other Ambulatory Visit: Payer: Self-pay

## 2021-03-26 VITALS — BP 108/62 | HR 88 | Temp 97.8°F | Resp 18

## 2021-03-26 DIAGNOSIS — Z7952 Long term (current) use of systemic steroids: Secondary | ICD-10-CM | POA: Diagnosis not present

## 2021-03-26 DIAGNOSIS — Z5111 Encounter for antineoplastic chemotherapy: Secondary | ICD-10-CM | POA: Diagnosis not present

## 2021-03-26 DIAGNOSIS — R1012 Left upper quadrant pain: Secondary | ICD-10-CM | POA: Insufficient documentation

## 2021-03-26 DIAGNOSIS — C563 Malignant neoplasm of bilateral ovaries: Secondary | ICD-10-CM

## 2021-03-26 DIAGNOSIS — Z79899 Other long term (current) drug therapy: Secondary | ICD-10-CM | POA: Insufficient documentation

## 2021-03-26 DIAGNOSIS — E611 Iron deficiency: Secondary | ICD-10-CM | POA: Diagnosis present

## 2021-03-26 DIAGNOSIS — Z87891 Personal history of nicotine dependence: Secondary | ICD-10-CM | POA: Diagnosis not present

## 2021-03-26 DIAGNOSIS — G629 Polyneuropathy, unspecified: Secondary | ICD-10-CM | POA: Insufficient documentation

## 2021-03-26 DIAGNOSIS — Z95828 Presence of other vascular implants and grafts: Secondary | ICD-10-CM

## 2021-03-26 DIAGNOSIS — D649 Anemia, unspecified: Secondary | ICD-10-CM

## 2021-03-26 LAB — CBC WITH DIFFERENTIAL/PLATELET
Abs Immature Granulocytes: 0.02 10*3/uL (ref 0.00–0.07)
Basophils Absolute: 0 10*3/uL (ref 0.0–0.1)
Basophils Relative: 1 %
Eosinophils Absolute: 0 10*3/uL (ref 0.0–0.5)
Eosinophils Relative: 1 %
HCT: 31.9 % — ABNORMAL LOW (ref 36.0–46.0)
Hemoglobin: 10.4 g/dL — ABNORMAL LOW (ref 12.0–15.0)
Immature Granulocytes: 0 %
Lymphocytes Relative: 24 %
Lymphs Abs: 1.5 10*3/uL (ref 0.7–4.0)
MCH: 35.7 pg — ABNORMAL HIGH (ref 26.0–34.0)
MCHC: 32.6 g/dL (ref 30.0–36.0)
MCV: 109.6 fL — ABNORMAL HIGH (ref 80.0–100.0)
Monocytes Absolute: 0.5 10*3/uL (ref 0.1–1.0)
Monocytes Relative: 7 %
Neutro Abs: 4.2 10*3/uL (ref 1.7–7.7)
Neutrophils Relative %: 67 %
Platelets: 206 10*3/uL (ref 150–400)
RBC: 2.91 MIL/uL — ABNORMAL LOW (ref 3.87–5.11)
RDW: 14.5 % (ref 11.5–15.5)
WBC: 6.2 10*3/uL (ref 4.0–10.5)
nRBC: 0 % (ref 0.0–0.2)

## 2021-03-26 LAB — COMPREHENSIVE METABOLIC PANEL
ALT: 28 U/L (ref 0–44)
AST: 24 U/L (ref 15–41)
Albumin: 3.2 g/dL — ABNORMAL LOW (ref 3.5–5.0)
Alkaline Phosphatase: 118 U/L (ref 38–126)
Anion gap: 9 (ref 5–15)
BUN: 16 mg/dL (ref 6–20)
CO2: 27 mmol/L (ref 22–32)
Calcium: 8.7 mg/dL — ABNORMAL LOW (ref 8.9–10.3)
Chloride: 101 mmol/L (ref 98–111)
Creatinine, Ser: 0.72 mg/dL (ref 0.44–1.00)
GFR, Estimated: >60 mL/min (ref >60)
Glucose, Bld: 134 mg/dL — ABNORMAL HIGH (ref 70–99)
Potassium: 3.5 mmol/L (ref 3.5–5.1)
Sodium: 137 mmol/L (ref 135–145)
Total Bilirubin: 0.1 mg/dL — ABNORMAL LOW (ref 0.3–1.2)
Total Protein: 6.4 g/dL — ABNORMAL LOW (ref 6.5–8.1)

## 2021-03-26 LAB — MAGNESIUM: Magnesium: 1.7 mg/dL (ref 1.7–2.4)

## 2021-03-26 MED ORDER — HEPARIN SOD (PORK) LOCK FLUSH 100 UNIT/ML IV SOLN
500.0000 [IU] | Freq: Once | INTRAVENOUS | Status: AC | PRN
  Administered 2021-03-26: 500 [IU]

## 2021-03-26 MED ORDER — SODIUM CHLORIDE 0.9 % IV SOLN
10.0000 mg | Freq: Once | INTRAVENOUS | Status: AC
  Administered 2021-03-26: 10 mg via INTRAVENOUS
  Filled 2021-03-26: qty 10

## 2021-03-26 MED ORDER — SODIUM CHLORIDE 0.9 % IV SOLN
793.2000 mg | Freq: Once | INTRAVENOUS | Status: AC
  Administered 2021-03-26: 790 mg via INTRAVENOUS
  Filled 2021-03-26: qty 79

## 2021-03-26 MED ORDER — FAMOTIDINE IN NACL 20-0.9 MG/50ML-% IV SOLN
20.0000 mg | Freq: Once | INTRAVENOUS | Status: AC
  Administered 2021-03-26: 20 mg via INTRAVENOUS
  Filled 2021-03-26: qty 50

## 2021-03-26 MED ORDER — SODIUM CHLORIDE 0.9% FLUSH
10.0000 mL | INTRAVENOUS | Status: DC | PRN
  Administered 2021-03-26: 10 mL

## 2021-03-26 MED ORDER — SODIUM CHLORIDE 0.9 % IV SOLN
175.0000 mg/m2 | Freq: Once | INTRAVENOUS | Status: AC
  Administered 2021-03-26: 336 mg via INTRAVENOUS
  Filled 2021-03-26: qty 56

## 2021-03-26 MED ORDER — SODIUM CHLORIDE 0.9 % IV SOLN
150.0000 mg | Freq: Once | INTRAVENOUS | Status: AC
  Administered 2021-03-26: 150 mg via INTRAVENOUS
  Filled 2021-03-26: qty 150

## 2021-03-26 MED ORDER — PALONOSETRON HCL INJECTION 0.25 MG/5ML
0.2500 mg | Freq: Once | INTRAVENOUS | Status: AC
  Administered 2021-03-26: 0.25 mg via INTRAVENOUS
  Filled 2021-03-26: qty 5

## 2021-03-26 MED ORDER — SODIUM CHLORIDE 0.9 % IV SOLN: Freq: Once | INTRAVENOUS | Status: AC

## 2021-03-26 MED ORDER — DIPHENHYDRAMINE HCL 50 MG/ML IJ SOLN
50.0000 mg | Freq: Once | INTRAMUSCULAR | Status: AC
  Administered 2021-03-26: 50 mg via INTRAVENOUS
  Filled 2021-03-26: qty 1

## 2021-03-26 NOTE — Progress Notes (Signed)
Patient tolerated chemotherapy with no complaints voiced. Side effects with management reviewed understanding verbalized. Port site clean and dry with no bruising or swelling noted at site. Good blood return noted before and after administration of chemotherapy. Band aid applied. Patient left in satisfactory condition with VSS and no s/s of distress noted. 

## 2021-03-26 NOTE — Progress Notes (Signed)
Patient has been examined by Dr. Katragadda, and vital signs and labs have been reviewed. ANC, Creatinine, LFTs, hemoglobin, and platelets are within treatment parameters per M.D. - pt may proceed with treatment.    °

## 2021-03-26 NOTE — Progress Notes (Signed)
Michelle Holt, [place] [address]   CLINIC:  Medical Oncology/Hematology  PCP:  [name], MD [address] / [place] [address] [phone]   REASON FOR VISIT:  Follow-up for high-grade serous ovarian cancer  PRIOR THERAPY:  1. Carboplatin and paclitaxel x 7 cycles from 08/24/2018 to 03/15/2019. 2. Laparoscopic TAH & BSO & omenectomy on 12/24/2018.  NGS Results: not done  CURRENT THERAPY: Carboplatin (AUC 6) / Paclitaxel (175) q21d x 6 cycles  BRIEF ONCOLOGIC HISTORY:  Oncology History  Ovarian cancer, bilateral (Michelle Holt)  07/07/2018 Pathology Results   PLEURAL FLUID, LEFT (SPECIMEN 1 OF 1, COLLECTED 07/07/18): - MALIGNANT CELLS CONSISTENT WITH ADENOCARCINOMA - SEE COMMENT  Source Pleural Fluid, (Specimen 1 of 1, collected on 07/07/2018) Gross Specimen: Received is/are 1000cc of bloody red fluid with tissue. (TC:tc) Prepared: # Smears: 0 # Concentration Technique Slides (i.e. ThinPrep): 1 # Cell Block: 1 Conventional Additional Studies: Two Hematology slides labeled [id number] Comment The malignant cells are positive for cytokeratin 7, p53, WT-1, Pax-8, Moc31, ER (weak) and EMA but negative for cytokeratin 20, TTF-1, GATA-3, CDX-2 and D2-40. Overall, the phenotype is consistent with a gynecologic primary; clinical correlation recommended.   07/07/2018 Procedure   Successful ultrasound guided left thoracentesis yielding 2.0 L of pleural fluid   07/08/2018 Procedure   1. Technically successful placement of left 14 French pigtail chest drain, placed to Pleur-evac water-seal.   07/08/2018 Procedure   1. Technically successful five [place] double lumen power injectable PICC placement   07/09/2018 Imaging   Ct chest 1. There is a moderate, loculated left hydropneumothorax with a small air component and moderate fluid component. The largest loculated component is located posteriorly. There is a pigtail drainage catheter about the lateral pleural  space. There is no obvious etiology, such as obvious mass or pleural disease.   2. There is a small right pleural effusion with associated atelectasis or consolidation and a subpleural consolidation of the superior segment right lower lobe (series 4, image 56), of uncertain significance, possibly infectious or inflammatory   07/10/2018 Imaging   Ct abdomen and pelvis: 1. The bilateral ovaries are enlarged by heterogeneous appearing cystic lesions, measuring 5.3 x 4.2 cm on the right (series 4, image 72) and 4.5 x 3.2 cm on the left (series 4, image 75). Consider dedicated pelvic ultrasound and/or pelvic MRI to further evaluate for solid components given high suspicion for GYN primary malignancy.   2. No other evidence of mass and no lymphadenopathy in the abdomen or pelvis.   3. Trace ascites. There is some suggestion of omental and peritoneal nodularity (e.g. Series 4, image 21), concerning for peritoneal metastatic disease.    4. Loculated left-sided pleural effusion with left-sided pleural drainage catheter in position. Small right pleural effusion   07/13/2018 Surgery   OPERATION: 1.  Left VATS (video-assisted thoracoscopic surgery) for drainage of loculated pleural effusion. 2.  Talc pleurodesis for malignant pleural effusion. 3.  Placement of PleurX catheter for management of malignant pleural effusion. 4.  Placement of On-Q analgesia catheter system.    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   07/13/2018 Pathology Results   Pleura, peel, Left Pleural - FIBRO-FIBRINOUS PLEURITIS - NEGATIVE FOR MALIGNANCY   07/18/2018 Initial Diagnosis   Ovarian cancer, bilateral ([place])   07/20/2018 Procedure   EGD  impression: Normal proximal esophagus and mid esophagus. Mild distal esophageal rings; dilation  not performed because of esophagitis. LA Grade C reflux esophagitis. Z-line regular, 30 cm from the incisors. 5 cm hiatal hernia. Non-bleeding gastric ulcer with no stigmata of bleeding. Gastritis. Duodenal erosions without bleeding. Normal second portion of the duodenum. No specimens collected.   08/19/2018 Genetic Testing   Negative genetic testing on the common hereditary cancer panel.  The Common Hereditary Gene Panel offered by [facility] includes sequencing and/or deletion duplication testing of the following 48 genes: APC, ATM, AXIN2, BARD1, BMPR1A, BRCA1, BRCA2, BRIP1, CDH1, CDK4, CDKN2A (p14ARF), CDKN2A (p16INK4a), CHEK2, CTNNA1, DICER1, EPCAM (Deletion/duplication testing only), GREM1 (promoter region deletion/duplication testing only), KIT, MEN1, MLH1, MSH2, MSH3, MSH6, MUTYH, NBN, NF1, NHTL1, PALB2, PDGFRA, PMS2, POLD1, POLE, PTEN, RAD50, RAD51C, RAD51D, RNF43, SDHB, SDHC, SDHD, SMAD4, SMARCA4. STK11, TP53, TSC1, TSC2, and VHL.  The following genes were evaluated for sequence changes only: SDHA and HOXB13 c.251G>A variant only. The report date is August 19, 2018.   08/24/2018 - 03/15/2019 Chemotherapy   Patient is on Treatment Plan : OVARIAN Carboplatin (AUC 6) / Paclitaxel (175) q21d x 6 cycles     03/05/2021 -  Chemotherapy   Patient is on Treatment Plan : OVARIAN Carboplatin (AUC 6) / Paclitaxel (175) q21d x 6 cycles       CANCER STAGING:  Cancer Staging  No matching staging information was found for the patient.  INTERVAL HISTORY:  Michelle Holt, a 51 y.o. female, returns for routine follow-up and consideration for next cycle of chemotherapy. Michelle Holt was last seen on 03/05/2021.  Due for cycle #2 of Carboplatin and Taxol today.   Overall, she tells me she has been feeling pretty well. She reports mild nausea and fatigue, and she denies vomiting. She reports tingling in her third, fourth, and fifth fingers bilaterally at night, and she denies any associated pain. She reports  constipation which was helped by Lactulose, and she is taking stool softener daily. Her appetite has been poor. She is taking 4-5 oxycodone tablets daily. She denies current bleeding and black stools. She reports a rash on her forearms upon exposure to the sun or after showering, and she first noticed this rash in May 2022. She reports abdominal cramping. She also reports SOB, even when sitting.    Overall, she feels ready for next cycle of chemo today.   REVIEW OF SYSTEMS:  Review of Systems  Constitutional:  Positive for appetite change and fatigue.  HENT:   Negative for nosebleeds.   Respiratory:  Positive for shortness of breath. Negative for hemoptysis.   Gastrointestinal:  Positive for abdominal pain (4/10), constipation and nausea. Negative for blood in stool and vomiting.  Genitourinary:  Negative for hematuria.   Neurological:  Positive for headaches and numbness.  Psychiatric/Behavioral:  Positive for sleep disturbance.   All other systems reviewed and are negative.  PAST MEDICAL/SURGICAL HISTORY:  Past Medical History:  Diagnosis Date   Anemia    Anxiety and depression    Arthritis of facet joints at multiple vertebral levels    L5-S1   Constipation    Dyslipidemia    Family history of breast cancer    Family history of uterine cancer    GERD (gastroesophageal reflux disease)    History of hiatal hernia    History of kidney stones    Insomnia    Irritable bowel syndrome    Migraine    Muscle tension headache    Neuropathy of  finger    Ovarian carcinoma ([place])    ovarian   Plantar fasciitis of right foot    Port-A-Cath in place 08/20/2018   Past Surgical History:  Procedure Laterality Date   ABDOMINAL HYSTERECTOMY     BIOPSY  10/27/2020   Procedure: BIOPSY;  Surgeon: Harvel Quale, MD;  [facility];  Service: Gastroenterology;;   CHOLECYSTECTOMY  2008   COLONOSCOPY N/A 08/13/2013   Procedure: COLONOSCOPY;  Surgeon: Rogene [place], MD;   [facility];  Service: Endoscopy;  Laterality: N/A;  230-moved to 145 Ann to notify pt   COLONOSCOPY WITH PROPOFOL N/A 11/15/2020   Procedure: COLONOSCOPY WITH PROPOFOL;  Surgeon: Rogene [place], MD;  [facility];  Service: Endoscopy;  Laterality: N/A;  1:40   ESOPHAGEAL DILATION N/A 02/28/2021   Procedure: ESOPHAGEAL DILATION;  Surgeon: Rogene [place], MD;  [facility];  Service: Endoscopy;  Laterality: N/A;   ESOPHAGOGASTRODUODENOSCOPY     ESOPHAGOGASTRODUODENOSCOPY (EGD) WITH PROPOFOL N/A 07/20/2018   Procedure: ESOPHAGOGASTRODUODENOSCOPY (EGD) WITH PROPOFOL;  Surgeon: Rogene [place], MD;  [facility];  Service: Endoscopy;  Laterality: N/A;  Possible esophageal dilation.   ESOPHAGOGASTRODUODENOSCOPY (EGD) WITH PROPOFOL N/A 10/27/2020   Procedure: ESOPHAGOGASTRODUODENOSCOPY (EGD) WITH PROPOFOL;  Surgeon: Harvel Quale, MD;  [facility];  Service: Gastroenterology;  Laterality: N/A;  2:10, pt knows to arrive at 10:15   ESOPHAGOGASTRODUODENOSCOPY (EGD) WITH PROPOFOL N/A 02/28/2021   Procedure: ESOPHAGOGASTRODUODENOSCOPY (EGD) WITH PROPOFOL;  Surgeon: Rogene [place], MD;  [facility];  Service: Endoscopy;  Laterality: N/A;  200 ASA 1   GIVENS CAPSULE STUDY N/A 11/24/2020   Procedure: GIVENS CAPSULE STUDY;  Surgeon: Rogene [place], MD;  [facility];  Service: Endoscopy;  Laterality: N/A;  7:30   IR ANGIOGRAM SELECTIVE EACH ADDITIONAL VESSEL  08/01/2018   IR ANGIOGRAM VISCERAL SELECTIVE  08/01/2018   IR EMBO ART  VEN HEMORR LYMPH EXTRAV  INC GUIDE ROADMAPPING  08/01/2018   IR IMAGING GUIDED PORT INSERTION  08/20/2018   IR PERC PLEURAL DRAIN W/INDWELL CATH W/IMG GUIDE  07/08/2018   IR THORACENTESIS ASP PLEURAL SPACE W/IMG GUIDE  07/07/2018   IR US GUIDE VASC ACCESS RIGHT  08/01/2018   PLEURAL EFFUSION DRAINAGE Left 07/13/2018   Procedure: DRAINAGE OF LOCULATED PLEURAL EFFUSION;  Surgeon: Ivin Poot,  MD;  Location: [facility];  Service: Thoracic;  Laterality: Left;   POLYPECTOMY  11/15/2020   Procedure: POLYPECTOMY;  Surgeon: Rogene [place], MD;  [facility];  Service: Endoscopy;;   REMOVAL OF PLEURAL DRAINAGE CATHETER Left 08/20/2018   Procedure: REMOVAL OF PLEURAL DRAINAGE CATHETER;  Surgeon: Ivin Poot, MD;  Location: [facility];  Service: Thoracic;  Laterality: Left;   REMOVAL OF PLEURAL DRAINAGE CATHETER Left 08/20/2018   Procedure: REMOVAL OF PLEURAL DRAINAGE CATHETER;  Surgeon: Ivin Poot, MD;  Location: [facility];  Service: Thoracic;  Laterality: Left;   TALC PLEURODESIS Left 07/13/2018   Procedure: Talc Pleuradesis;  Surgeon: Prescott Gum, Collier [place], MD;  Location: [facility];  Service: Thoracic;  Laterality: Left;   TOTAL HIP ARTHROPLASTY Left 06/05/2020   Procedure: LEFT TOTAL HIP ARTHROPLASTY ANTERIOR APPROACH;  Surgeon: Leandrew Koyanagi, MD;  Location: [facility];  Service: Orthopedics;  Laterality: Left;  3-C   TUBAL LIGATION Bilateral    UTERINE ABLATION     VIDEO ASSISTED THORACOSCOPY Left 07/13/2018   Procedure: VIDEO ASSISTED THORACOSCOPY;  Surgeon: Ivin Poot,  MD;  [facility];  Service: Thoracic;  Laterality: Left;    SOCIAL HISTORY:  Social History   Socioeconomic History   Marital status: Widowed    Spouse name: Not on file   Number of children: 2   Years of education: 2-College   Highest education level: Not on file  Occupational History    Employer: [employer]  Tobacco Use   Smoking status: Former    Packs/day: 0.50    Years: 17.00    Pack years: 8.50    Types: Cigarettes    Quit date: 06/22/2018    Years since quitting: 2.7   Smokeless tobacco: Never  Vaping Use   Vaping Use: Never used  Substance and Sexual Activity   Alcohol use: Not Currently    Alcohol/week: 1.0 standard drink    Types: 1 Glasses of wine per week   Drug use: No   Sexual activity: Not on file  Other Topics Concern   Not on file  Social History Narrative   Patient lives at home with  her daughter.    Patient has 2 children.    Patient is widowed.    Patient is right handed.    Patient has her Associates degree.      Social Determinants of Health   Financial Resource Strain: Not on file  Food Insecurity: Not on file  Transportation Needs: Not on file  Physical Activity: Not on file  Stress: Not on file  Social Connections: Not on file  Intimate Partner Violence: Not on file    FAMILY HISTORY:  Family History  Problem Relation Age of Onset   Hypertension Mother    Obesity Mother    Diabetes Mother    Kidney disease Mother    Peripheral vascular disease Father    Atrial fibrillation Father    COPD Brother    Osteoporosis Brother    Crohn's disease Sister    Uterine cancer Sister 56       maternal half sister   Breast cancer Maternal Aunt 36   Colon cancer Neg Hx     CURRENT MEDICATIONS:  Current Outpatient Medications  Medication Sig Dispense Refill   albuterol (VENTOLIN HFA) 108 (90 Base) MCG/ACT inhaler Inhale 2 puffs into the lungs every 6 (six) hours as needed for wheezing or shortness of breath.     B Complex-C (B-COMPLEX WITH VITAMIN C) tablet Take 1 tablet by mouth daily.     Cholecalciferol (VITAMIN D) 50 MCG (2000 UT) CAPS Take 2,000 Units by mouth daily.     dexamethasone (DECADRON) 4 MG tablet Take 1 tablet (4 mg total) by mouth daily. Three days after each treatment 30 tablet 2   diazepam (VALIUM) 5 MG tablet TAKE ONE TABLET BY MOUTH AT BEDTIME 30 tablet 5   dicyclomine (BENTYL) 10 MG capsule Take 1 capsule (10 mg total) by mouth 3 (three) times daily as needed for spasms. 90 capsule 3   docusate sodium (COLACE) 100 MG capsule Take 1 capsule (100 mg total) by mouth daily as needed. To be taken after surgery 30 capsule 2   gabapentin (NEURONTIN) 100 MG capsule TAKE TWO (2) CAPSULES BY MOUTH AT BEDTIME 60 capsule 2   lactulose (CHRONULAC) 10 GM/15ML solution Take 15 mLs (10 g total) by mouth as needed. 15 mls every 3 hours until bowel  movement achieved, then take daily. 236 mL 0   lidocaine-prilocaine (EMLA) cream Apply 1 application topically as needed (pain).     Magnesium 500 MG  TABS Take 500 mg by mouth in the morning.     methylPREDNISolone (MEDROL DOSEPAK) 4 MG TBPK tablet Take by mouth as directed. 21 tablet 0   metoCLOPramide (REGLAN) 10 MG tablet Take 1 tablet (10 mg total) by mouth 3 (three) times daily before meals. 90 tablet 1   omeprazole (PRILOSEC) 40 MG capsule Take 1 capsule (40 mg total) by mouth 2 (two) times daily before a meal. 60 capsule 5   ondansetron (ZOFRAN) 4 MG tablet Take 1 tablet (4 mg total) by mouth every 8 (eight) hours as needed for nausea or vomiting. 40 tablet 3   Oxycodone HCl 20 MG TABS Take 1 tablet (20 mg total) by mouth every 4 (four) hours as needed. 180 tablet 0   sennosides-docusate sodium (SENOKOT-S) 8.6-50 MG tablet Take 1 tablet by mouth daily as needed for constipation.     SUMAtriptan (IMITREX) 100 MG tablet TAKE ONE TABLET BY MOUTH PRN UP to TWICE DAILY AS NEEDED. 9 tablet 5   tiZANidine (ZANAFLEX) 4 MG tablet Take 1 tablet (4 mg total) by mouth 3 (three) times daily as needed for muscle spasms. 60 tablet 0   triamcinolone cream (KENALOG) 0.1 % Apply 1 application topically 2 (two) times daily as needed (rash). 80 g 11   venlafaxine XR (EFFEXOR-XR) 150 MG 24 hr capsule TAKE ONE CAPSULE BY MOUTH DAILY WITH BREAKFAST 30 capsule 6   No current facility-administered medications for this visit.   Facility-Administered Medications Ordered in Other Visits  Medication Dose Route Frequency Provider Last Rate Last Admin   0.9 %  sodium chloride infusion   Intravenous Continuous Derek Jack, MD   Stopped at 10/23/20 1547   sodium chloride flush (NS) 0.9 % injection 10 mL  10 mL Intravenous PRN Derek Jack, MD   10 mL at 10/23/20 1544    ALLERGIES:  Allergies  Allergen Reactions   Morphine And Related Itching   Nickel Itching   Nortriptyline Other (See Comments)     Significant weight gain   Topamax [Topiramate] Diarrhea and Nausea Only   Xanax [Alprazolam]     Can't wake up    Actifed Cold-Allergy [Chlorpheniramine-Phenyleph Er] Rash   Amoxicillin Rash    Did it involve swelling of the face/tongue/throat, SOB, or low BP? Unknown Did it involve sudden or severe rash/hives, skin peeling, or any reaction on the inside of your mouth or nose? Unknown Did you need to seek medical attention at a hospital or doctor's office? Unknown When did it last happen? teenager       If all above answers are "NO", may proceed with cephalosporin use.    Codeine Hives   Erythromycin Rash   Penicillins Rash    Did it involve swelling of the face/tongue/throat, SOB, or low BP? Unknown Did it involve sudden or severe rash/hives, skin peeling, or any reaction on the inside of your mouth or nose? Unknown Did you need to seek medical attention at a hospital or doctor's office? Unknown When did it last happen? teenager       If all above answers are "NO", may proceed with cephalosporin use.    Sudafed [Pseudoephedrine Hcl] Rash    PHYSICAL EXAM:  Performance status (ECOG): 1 - Symptomatic but completely ambulatory  There were no vitals filed for this visit. Wt Readings from Last 3 Encounters:  03/26/21 183 lb 12.8 oz (83.4 kg)  03/12/21 180 lb 3.2 oz (81.7 kg)  03/05/21 184 lb (83.5 kg)   Physical Exam Vitals  reviewed.  Constitutional:      Appearance: Normal appearance. She is obese.  Cardiovascular:     Rate and Rhythm: Normal rate and regular rhythm.     Pulses: Normal pulses.     Heart sounds: Normal heart sounds.  Pulmonary:     Effort: Pulmonary effort is normal.     Breath sounds: Normal breath sounds.  Abdominal:     Palpations: Abdomen is soft. There is no hepatomegaly, splenomegaly or mass.     Tenderness: There is abdominal tenderness.  Neurological:     General: No focal deficit present.     Mental Status: She is alert and oriented to person,  place, and time.  Psychiatric:        Mood and Affect: Mood normal.        Behavior: Behavior normal.    LABORATORY DATA:  I have reviewed the labs as listed.  CBC Latest Ref Rng & Units 03/26/2021 03/12/2021 03/05/2021  WBC 4.0 - 10.5 K/uL 6.2 8.8 11.2(H)  Hemoglobin 12.0 - 15.0 g/dL 10.4(L) 12.8 12.7  Hematocrit 36.0 - 46.0 % 31.9(L) 38.1 38.5  Platelets 150 - 400 K/uL 206 308 437(H)   CMP Latest Ref Rng & Units 03/26/2021 03/12/2021 03/05/2021  Glucose 70 - 99 mg/dL 134(H) 117(H) 98  BUN 6 - 20 mg/dL 16 28(H) 12  Creatinine 0.44 - 1.00 mg/dL 0.72 0.71 0.62  Sodium 135 - 145 mmol/L 137 131(L) 137  Potassium 3.5 - 5.1 mmol/L 3.5 3.5 3.9  Chloride 98 - 111 mmol/L 101 97(L) 100  CO2 22 - 32 mmol/L _0 Calcium 8.9 - 10.3 mg/dL 8.7(L) 8.8(L) 9.0  Total Protein 6.5 - 8.1 g/dL 6.4(L) 7.1 6.6  Total Bilirubin 0.3 - 1.2 mg/dL 0.1(L) 0.3 0.7  Alkaline Phos 38 - 126 U/L 118 139(H) 155(H)  AST 15 - 41 U/L 24 45(H) 30  ALT 0 - 44 U/L 28 77(H) 21    DIAGNOSTIC IMAGING:  I have independently reviewed the scans and discussed with the patient. No results found.   ASSESSMENT:  1.  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 -Robotic assisted laparoscopic TAH and BSO and omentectomy on 12/24/2018, pathology showing high-grade serous carcinoma, PT3P NX. -Germline mutation testing was negative. -3 more cycles of adjuvant chemotherapy completed on 03/15/2019. -CTAP on 04/13/2019 showed no findings of active malignancy.  28% reduction in the volume of presumed chronic hematoma/chronic fluid collection splaying the upper margin of the spleen.  Large type III hiatal hernia. -CTAP on 10/13/2019 shows no findings of recurrence or metastatic disease. - Foundation 1 shows HRD+, LOH score>16%.  MSI-stable.  MYC amplification.  T p53 mutation. - We reviewed CT CAP from 02/21/2021  which showed progressive peritoneal metastasis compared to 12/06/2020 scan.  No bowel obstruction.  Small right pleural effusion with mild enlargement of an isolated left external iliac lymph node. - Reviewed EGD from 02/28/2021 which showed hiatal hernia and normal findings. - She reported epigastric and left upper quadrant pain worse in the last 1 week which is related to progression of her malignancy. - Cycle 1 of carboplatin and paclitaxel started on 03/05/2021.  PLAN:  1.  Clinical stage IVa high-grade serous ovarian cancer, positive cytology of left pleural effusion: - She did not have any major GI side effects after cycle 1. - She complained of slight nausea but denied any vomiting.  She reports tiredness  during this week. - Reviewed labs from today which shows white count and platelet count was normal.  Hemoglobin 10.4, likely from myelosuppression.  CA125 was 736 on 03/05/2021.  LFTs and creatinine was normal. - Proceed with cycle 2 today.  RTC 3 weeks for follow-up.  We will plan to check ferritin and iron panel at next visit.   2.  Lower back/left upper quadrant pain: - Continue oxycodone 20 mg 4 to 5 tablets daily.  Pain is well controlled.   3.  Peripheral neuropathy: - She reports tingling in the 3, 4 and 5 digits on both hands at nighttime.  We will continue to closely monitor.  4.  Iron deficiency state: - Last Feraheme on 02/19/2021. - Hemoglobin today is 10.4, likely from chemotherapy..   Orders placed this encounter:  No orders of the defined types were placed in this encounter.    Derek Jack, MD [facility] [phone]   I, Thana Ates, am acting as a scribe for Dr. Derek Jack.  I, Derek Jack MD, have reviewed the above documentation for accuracy and completeness, and I agree with the above.

## 2021-03-26 NOTE — Progress Notes (Signed)
Patients port flushed without difficulty.  Good blood return noted with no bruising or swelling noted at site.  Stable during access and blood draw.  Patient to remain accessed for treatment. 

## 2021-03-26 NOTE — Patient Instructions (Signed)
[facility] Discharge Instructions   You were seen and examined today by Dr. Katragadda.  He reviewed the results of your lab work which is normal/stable.   We will proceed with your treatment today.   Return as scheduled in 3 weeks.    Thank you for choosing [facility] at [facility] to provide your oncology and hematology care.  To afford each patient quality time with our provider, please arrive at least 15 minutes before your scheduled appointment time.   If you have a lab appointment with the Cancer Center please come in thru the Main Entrance and check in at the main information desk.  You need to re-schedule your appointment should you arrive 10 or more minutes late.  We strive to give you quality time with our providers, and arriving late affects you and other patients whose appointments are after yours.  Also, if you no show three or more times for appointments you may be dismissed from the clinic at the providers discretion.     Again, thank you for choosing [facility].  Our hope is that these requests will decrease the amount of time that you wait before being seen by our physicians.       _____________________________________________________________  Should you have questions after your visit to [facility], please contact our office at [phone] and follow the prompts.  Our office hours are 8:00 a.m. and 4:30 p.m. Monday - Friday.  Please note that voicemails left after 4:00 p.m. may not be returned until the following business day.  We are closed weekends and major holidays.  You do have access to a nurse 24-7, just call the main number to the clinic [phone] and do not press any options, hold on the line and a nurse will answer the phone.    For prescription refill requests, have your pharmacy contact our office and allow 72 hours.    Due to Covid, you will need to wear a mask upon  entering the hospital. If you do not have a mask, a mask will be given to you at the Main Entrance upon arrival. For doctor visits, patients may have 1 support person age 18 or older with them. For treatment visits, patients can not have anyone with them due to social distancing guidelines and our immunocompromised population.      

## 2021-03-26 NOTE — Patient Instructions (Signed)
[facility]  Discharge Instructions: °Thank you for choosing [facility] to provide your oncology and hematology care.  °If you have a lab appointment with the Cancer Center, please come in thru the Main Entrance and check in at the main information desk. ° °Wear comfortable clothing and clothing appropriate for easy access to any Portacath or PICC line.  ° °We strive to give you quality time with your provider. You may need to reschedule your appointment if you arrive late (15 or more minutes).  Arriving late affects you and other patients whose appointments are after yours.  Also, if you miss three or more appointments without notifying the office, you may be dismissed from the clinic at the provider’s discretion.    °  °For prescription refill requests, have your pharmacy contact our office and allow 72 hours for refills to be completed.   ° °Today you received the following chemotherapy and/or immunotherapy agents Taxol/Carbo, return as scheduled. °  °To help prevent nausea and vomiting after your treatment, we encourage you to take your nausea medication as directed. ° °BELOW ARE SYMPTOMS THAT SHOULD BE REPORTED IMMEDIATELY: °*FEVER GREATER THAN 100.4 F (38 °C) OR HIGHER °*CHILLS OR SWEATING °*NAUSEA AND VOMITING THAT IS NOT CONTROLLED WITH YOUR NAUSEA MEDICATION °*UNUSUAL SHORTNESS OF BREATH °*UNUSUAL BRUISING OR BLEEDING °*URINARY PROBLEMS (pain or burning when urinating, or frequent urination) °*BOWEL PROBLEMS (unusual diarrhea, constipation, pain near the anus) °TENDERNESS IN MOUTH AND THROAT WITH OR WITHOUT PRESENCE OF ULCERS (sore throat, sores in mouth, or a toothache) °UNUSUAL RASH, SWELLING OR PAIN  °UNUSUAL VAGINAL DISCHARGE OR ITCHING  ° °Items with * indicate a potential emergency and should be followed up as soon as possible or go to the Emergency Department if any problems should occur. ° °Please show the CHEMOTHERAPY ALERT CARD or IMMUNOTHERAPY ALERT CARD at check-in to  the Emergency Department and triage nurse. ° °Should you have questions after your visit or need to cancel or reschedule your appointment, please contact [facility] [phone]  and follow the prompts.  Office hours are 8:00 a.m. to 4:30 p.m. Monday - Friday. Please note that voicemails left after 4:00 p.m. may not be returned until the following business day.  We are closed weekends and major holidays. You have access to a nurse at all times for urgent questions. Please call the main number to the clinic [phone] and follow the prompts. ° °For any non-urgent questions, you may also contact your provider using MyChart. We now offer e-Visits for anyone 18 and older to request care online for non-urgent symptoms. For details visit [email or web address]. °  °Also download the MyChart app! Go to the app store, search "MyChart", open the app, select [facility], and log in with your MyChart username and password. ° °Due to Covid, a mask is required upon entering the hospital/clinic. If you do not have a mask, one will be given to you upon arrival. For doctor visits, patients may have 1 support person aged 18 or older with them. For treatment visits, patients cannot have anyone with them due to current Covid guidelines and our immunocompromised population.  °

## 2021-03-27 ENCOUNTER — Other Ambulatory Visit (HOSPITAL_COMMUNITY): Payer: Self-pay

## 2021-03-27 ENCOUNTER — Encounter (HOSPITAL_COMMUNITY): Payer: Self-pay

## 2021-03-27 MED ORDER — OXYCODONE HCL 20 MG PO TABS: 1.0000 | ORAL_TABLET | ORAL | 0 refills | Status: DC | PRN

## 2021-03-27 NOTE — Progress Notes (Signed)
VM received from patient requesting a return call. I called the patient back. Patient inquired about premedications given yesterday, all questions addressed and answered to the patient's satisfaction. Patient reports itching on her way home from treatment yesterday afternoon, no rash noted. Patient took benadryl and reports resolution of itching. Dr. Delton Coombes made aware. Patient instructed to call with additional questions or concerns.  ?

## 2021-03-30 ENCOUNTER — Encounter (HOSPITAL_COMMUNITY): Payer: Self-pay

## 2021-04-02 ENCOUNTER — Encounter (HOSPITAL_COMMUNITY): Payer: Self-pay

## 2021-04-16 ENCOUNTER — Inpatient Hospital Stay (HOSPITAL_BASED_OUTPATIENT_CLINIC_OR_DEPARTMENT_OTHER): Payer: Medicare Other | Admitting: Hematology

## 2021-04-16 ENCOUNTER — Inpatient Hospital Stay (HOSPITAL_COMMUNITY): Payer: Medicare Other | Attending: Hematology

## 2021-04-16 ENCOUNTER — Inpatient Hospital Stay (HOSPITAL_COMMUNITY): Payer: Medicare Other

## 2021-04-16 VITALS — BP 113/62 | HR 86 | Temp 98.2°F | Ht 63.0 in | Wt 184.9 lb

## 2021-04-16 VITALS — BP 120/61 | HR 95 | Temp 98.0°F

## 2021-04-16 DIAGNOSIS — R32 Unspecified urinary incontinence: Secondary | ICD-10-CM | POA: Insufficient documentation

## 2021-04-16 DIAGNOSIS — Z79899 Other long term (current) drug therapy: Secondary | ICD-10-CM | POA: Insufficient documentation

## 2021-04-16 DIAGNOSIS — K5900 Constipation, unspecified: Secondary | ICD-10-CM | POA: Diagnosis not present

## 2021-04-16 DIAGNOSIS — E661 Drug-induced obesity: Secondary | ICD-10-CM | POA: Diagnosis not present

## 2021-04-16 DIAGNOSIS — Z5111 Encounter for antineoplastic chemotherapy: Secondary | ICD-10-CM | POA: Diagnosis present

## 2021-04-16 DIAGNOSIS — Z90710 Acquired absence of both cervix and uterus: Secondary | ICD-10-CM | POA: Diagnosis not present

## 2021-04-16 DIAGNOSIS — G629 Polyneuropathy, unspecified: Secondary | ICD-10-CM | POA: Insufficient documentation

## 2021-04-16 DIAGNOSIS — C563 Malignant neoplasm of bilateral ovaries: Secondary | ICD-10-CM | POA: Insufficient documentation

## 2021-04-16 DIAGNOSIS — Z90722 Acquired absence of ovaries, bilateral: Secondary | ICD-10-CM | POA: Diagnosis not present

## 2021-04-16 DIAGNOSIS — Z95828 Presence of other vascular implants and grafts: Secondary | ICD-10-CM

## 2021-04-16 DIAGNOSIS — Z87891 Personal history of nicotine dependence: Secondary | ICD-10-CM | POA: Insufficient documentation

## 2021-04-16 DIAGNOSIS — Z9079 Acquired absence of other genital organ(s): Secondary | ICD-10-CM | POA: Insufficient documentation

## 2021-04-16 DIAGNOSIS — R1012 Left upper quadrant pain: Secondary | ICD-10-CM | POA: Insufficient documentation

## 2021-04-16 DIAGNOSIS — F411 Generalized anxiety disorder: Secondary | ICD-10-CM

## 2021-04-16 DIAGNOSIS — E611 Iron deficiency: Secondary | ICD-10-CM | POA: Diagnosis present

## 2021-04-16 DIAGNOSIS — D649 Anemia, unspecified: Secondary | ICD-10-CM

## 2021-04-16 LAB — COMPREHENSIVE METABOLIC PANEL
ALT: 18 U/L (ref 0–44)
AST: 21 U/L (ref 15–41)
Albumin: 3.7 g/dL (ref 3.5–5.0)
Alkaline Phosphatase: 127 U/L — ABNORMAL HIGH (ref 38–126)
Anion gap: 9 (ref 5–15)
BUN: 14 mg/dL (ref 6–20)
CO2: 27 mmol/L (ref 22–32)
Calcium: 9.2 mg/dL (ref 8.9–10.3)
Chloride: 102 mmol/L (ref 98–111)
Creatinine, Ser: 0.55 mg/dL (ref 0.44–1.00)
GFR, Estimated: >60 mL/min (ref >60)
Glucose, Bld: 114 mg/dL — ABNORMAL HIGH (ref 70–99)
Potassium: 3.8 mmol/L (ref 3.5–5.1)
Sodium: 138 mmol/L (ref 135–145)
Total Bilirubin: 0.3 mg/dL (ref 0.3–1.2)
Total Protein: 7.1 g/dL (ref 6.5–8.1)

## 2021-04-16 LAB — CBC WITH DIFFERENTIAL/PLATELET
Abs Immature Granulocytes: 0.02 10*3/uL (ref 0.00–0.07)
Basophils Absolute: 0 10*3/uL (ref 0.0–0.1)
Basophils Relative: 0 %
Eosinophils Absolute: 0.1 10*3/uL (ref 0.0–0.5)
Eosinophils Relative: 1 %
HCT: 33.6 % — ABNORMAL LOW (ref 36.0–46.0)
Hemoglobin: 10.8 g/dL — ABNORMAL LOW (ref 12.0–15.0)
Immature Granulocytes: 0 %
Lymphocytes Relative: 27 %
Lymphs Abs: 1.8 10*3/uL (ref 0.7–4.0)
MCH: 35.8 pg — ABNORMAL HIGH (ref 26.0–34.0)
MCHC: 32.1 g/dL (ref 30.0–36.0)
MCV: 111.3 fL — ABNORMAL HIGH (ref 80.0–100.0)
Monocytes Absolute: 0.5 10*3/uL (ref 0.1–1.0)
Monocytes Relative: 7 %
Neutro Abs: 4.4 10*3/uL (ref 1.7–7.7)
Neutrophils Relative %: 65 %
Platelets: 154 10*3/uL (ref 150–400)
RBC: 3.02 MIL/uL — ABNORMAL LOW (ref 3.87–5.11)
RDW: 18.7 % — ABNORMAL HIGH (ref 11.5–15.5)
WBC: 6.8 10*3/uL (ref 4.0–10.5)
nRBC: 0 % (ref 0.0–0.2)

## 2021-04-16 LAB — FERRITIN: Ferritin: 175 ng/mL (ref 11–307)

## 2021-04-16 LAB — IRON AND TIBC
Iron: 72 ug/dL (ref 28–170)
Saturation Ratios: 20 % (ref 10.4–31.8)
TIBC: 353 ug/dL (ref 250–450)
UIBC: 281 ug/dL

## 2021-04-16 LAB — MAGNESIUM: Magnesium: 1.7 mg/dL (ref 1.7–2.4)

## 2021-04-16 MED ORDER — PALONOSETRON HCL INJECTION 0.25 MG/5ML
0.2500 mg | Freq: Once | INTRAVENOUS | Status: AC
  Administered 2021-04-16: 0.25 mg via INTRAVENOUS
  Filled 2021-04-16: qty 5

## 2021-04-16 MED ORDER — FAMOTIDINE IN NACL 20-0.9 MG/50ML-% IV SOLN
20.0000 mg | Freq: Once | INTRAVENOUS | Status: AC
  Administered 2021-04-16: 20 mg via INTRAVENOUS
  Filled 2021-04-16: qty 50

## 2021-04-16 MED ORDER — SODIUM CHLORIDE 0.9% FLUSH
10.0000 mL | INTRAVENOUS | Status: DC | PRN
  Administered 2021-04-16: 10 mL

## 2021-04-16 MED ORDER — LORAZEPAM 2 MG/ML IJ SOLN
INTRAMUSCULAR | Status: AC
  Administered 2021-04-16: 0.5 mg via INTRAVENOUS
  Filled 2021-04-16: qty 1

## 2021-04-16 MED ORDER — SODIUM CHLORIDE 0.9 % IV SOLN
10.0000 mg | Freq: Once | INTRAVENOUS | Status: AC
  Administered 2021-04-16: 10 mg via INTRAVENOUS
  Filled 2021-04-16: qty 10

## 2021-04-16 MED ORDER — SODIUM CHLORIDE 0.9 % IV SOLN
793.2000 mg | Freq: Once | INTRAVENOUS | Status: AC
  Administered 2021-04-16: 790 mg via INTRAVENOUS
  Filled 2021-04-16: qty 79

## 2021-04-16 MED ORDER — LORAZEPAM 2 MG/ML IJ SOLN: 0.5000 mg | Freq: Once | INTRAMUSCULAR | Status: AC

## 2021-04-16 MED ORDER — SODIUM CHLORIDE 0.9 % IV SOLN
175.0000 mg/m2 | Freq: Once | INTRAVENOUS | Status: AC
  Administered 2021-04-16: 336 mg via INTRAVENOUS
  Filled 2021-04-16: qty 16.7

## 2021-04-16 MED ORDER — DIPHENHYDRAMINE HCL 50 MG/ML IJ SOLN
50.0000 mg | Freq: Once | INTRAMUSCULAR | Status: AC
  Administered 2021-04-16: 50 mg via INTRAVENOUS
  Filled 2021-04-16: qty 1

## 2021-04-16 MED ORDER — SODIUM CHLORIDE 0.9 % IV SOLN: Freq: Once | INTRAVENOUS | Status: AC

## 2021-04-16 MED ORDER — LINACLOTIDE 145 MCG PO CAPS: 145.0000 ug | ORAL_CAPSULE | Freq: Every day | ORAL | 2 refills | Status: DC

## 2021-04-16 MED ORDER — HEPARIN SOD (PORK) LOCK FLUSH 100 UNIT/ML IV SOLN
500.0000 [IU] | Freq: Once | INTRAVENOUS | Status: AC | PRN
  Administered 2021-04-16: 500 [IU]

## 2021-04-16 MED ORDER — FLUCONAZOLE 100 MG PO TABS: ORAL_TABLET | ORAL | 0 refills | Status: DC

## 2021-04-16 MED ORDER — SODIUM CHLORIDE 0.9 % IV SOLN
150.0000 mg | Freq: Once | INTRAVENOUS | Status: AC
  Administered 2021-04-16: 150 mg via INTRAVENOUS
  Filled 2021-04-16: qty 150

## 2021-04-16 NOTE — Patient Instructions (Signed)
[facility]  Discharge Instructions: Thank you for choosing [facility] to provide your oncology and hematology care.  If you have a lab appointment with the Cancer Center, please come in thru the Main Entrance and check in at the main information desk.  Wear comfortable clothing and clothing appropriate for easy access to any Portacath or PICC line.   We strive to give you quality time with your provider. You may need to reschedule your appointment if you arrive late (15 or more minutes).  Arriving late affects you and other patients whose appointments are after yours.  Also, if you miss three or more appointments without notifying the office, you may be dismissed from the clinic at the provider's discretion.      For prescription refill requests, have your pharmacy contact our office and allow 72 hours for refills to be completed.    Today you received the following chemotherapy and/or immunotherapy agents Taxol/Carboplatin.       To help prevent nausea and vomiting after your treatment, we encourage you to take your nausea medication as directed.  BELOW ARE SYMPTOMS THAT SHOULD BE REPORTED IMMEDIATELY: *FEVER GREATER THAN 100.4 F (38 C) OR HIGHER *CHILLS OR SWEATING *NAUSEA AND VOMITING THAT IS NOT CONTROLLED WITH YOUR NAUSEA MEDICATION *UNUSUAL SHORTNESS OF BREATH *UNUSUAL BRUISING OR BLEEDING *URINARY PROBLEMS (pain or burning when urinating, or frequent urination) *BOWEL PROBLEMS (unusual diarrhea, constipation, pain near the anus) TENDERNESS IN MOUTH AND THROAT WITH OR WITHOUT PRESENCE OF ULCERS (sore throat, sores in mouth, or a toothache) UNUSUAL RASH, SWELLING OR PAIN  UNUSUAL VAGINAL DISCHARGE OR ITCHING   Items with * indicate a potential emergency and should be followed up as soon as possible or go to the Emergency Department if any problems should occur.  Please show the CHEMOTHERAPY ALERT CARD or IMMUNOTHERAPY ALERT CARD at check-in to the  Emergency Department and triage nurse.  Should you have questions after your visit or need to cancel or reschedule your appointment, please contact [facility] [phone]  and follow the prompts.  Office hours are 8:00 a.m. to 4:30 p.m. Monday - Friday. Please note that voicemails left after 4:00 p.m. may not be returned until the following business day.  We are closed weekends and major holidays. You have access to a nurse at all times for urgent questions. Please call the main number to the clinic [phone] and follow the prompts.  For any non-urgent questions, you may also contact your provider using MyChart. We now offer e-Visits for anyone 18 and older to request care online for non-urgent symptoms. For details visit [email or web address].   Also download the MyChart app! Go to the app store, search "MyChart", open the app, select [facility], and log in with your MyChart username and password.  Due to Covid, a mask is required upon entering the hospital/clinic. If you do not have a mask, one will be given to you upon arrival. For doctor visits, patients may have 1 support person aged 18 or older with them. For treatment visits, patients cannot have anyone with them due to current Covid guidelines and our immunocompromised population.  

## 2021-04-16 NOTE — Progress Notes (Signed)
Patient presents today for Taxol/Carbo per providers order.  Vital signs and labs within parameters for treatment.   ? ?Message received from Anastasio Champion RN/Dr. Delton Coombes patient okay for treatment. ? ?Approximately 1535 patient stated that she was hot and nauseated, cheeks flushed and diarrhea.  Dr. Delton Coombes notified and came to see the patient.  Ativan 0.5 mg and a 500 cc bolus ordered and given.   ? ?Patient currently feeling better.  Taxol/Carboplatin given today per MD orders.    Vital signs stable.  No complaints at this time.  Discharge from clinic ambulatory in stable condition.  Alert and oriented X 3.  Follow up with [facility] as scheduled.  ?

## 2021-04-16 NOTE — Progress Notes (Signed)
? ?[facility] ?[address] ?[place], [place] [address] ? ? ?CLINIC:  ?Medical Oncology/Hematology ? ?PCP:  ?[name], MD ?[place] / [place] [address] ?[phone] ? ? ?REASON FOR VISIT:  ?Follow-up for high-grade serous ovarian cancer ? ?PRIOR THERAPY:  ?1. Carboplatin and paclitaxel x 7 cycles from 08/24/2018 to 03/15/2019. ?2. Laparoscopic TAH & BSO & omenectomy on 12/24/2018. ? ?NGS Results: not done ? ?CURRENT THERAPY: Carboplatin (AUC 6) / Paclitaxel (175) q21d x 6 cycles ? ?BRIEF ONCOLOGIC HISTORY:  ?Oncology History  ?Ovarian cancer, bilateral ([place])  ?07/07/2018 Pathology Results  ? PLEURAL FLUID, LEFT (SPECIMEN 1 OF 1, COLLECTED 07/07/18): ?- MALIGNANT CELLS CONSISTENT WITH ADENOCARCINOMA ?- SEE COMMENT ? ?Source ?Pleural Fluid, (Specimen 1 of 1, collected on 07/07/2018) ?Gross ?Specimen: Received is/are 1000cc of bloody red fluid with tissue. (TC:tc) ?Prepared: ?# Smears: 0 ?# Concentration Technique Slides (i.e. ThinPrep): 1 ?# Cell Block: 1 Conventional ?Additional Studies: Two Hematology slides labeled [id number] ?Comment ?The malignant cells are positive for cytokeratin 7, p53, WT-1, Pax-8, Moc31, ER (weak) and EMA but negative for cytokeratin 20, TTF-1, GATA-3, CDX-2 and D2-40. Overall, the phenotype is consistent with a gynecologic primary; clinical correlation recommended. ?  ?07/07/2018 Procedure  ? Successful ultrasound guided left thoracentesis yielding 2.0 L of pleural fluid ?  ?07/08/2018 Procedure  ? 1. Technically successful placement of left 14 French pigtail chest drain, placed to Pleur-evac water-seal. ?  ?07/08/2018 Procedure  ? 1. Technically successful five [place] double lumen power injectable PICC placement ?  ?07/09/2018 Imaging  ? Ct chest ?1. There is a moderate, loculated left hydropneumothorax with a small air component and moderate fluid component. The largest loculated component is located posteriorly. There is a pigtail drainage catheter about the lateral pleural  space. There is no obvious etiology, such as obvious mass or pleural disease. ?  ?2. There is a small right pleural effusion with associated atelectasis or consolidation and a subpleural consolidation of the superior segment right lower lobe (series 4, image 56), of uncertain significance, possibly infectious or inflammatory ?  ?07/10/2018 Imaging  ? Ct abdomen and pelvis: ?1. The bilateral ovaries are enlarged by heterogeneous appearing cystic lesions, measuring 5.3 x 4.2 cm on the right (series 4, image 72) and 4.5 x 3.2 cm on the left (series 4, image 75). Consider dedicated pelvic ultrasound and/or pelvic MRI to further evaluate for solid components given high suspicion for GYN primary malignancy. ?  ?2. No other evidence of mass and no lymphadenopathy in the abdomen or pelvis. ?  ?3. Trace ascites. There is some suggestion of omental and peritoneal nodularity (e.g. Series 4, image 49), concerning for peritoneal metastatic disease. ?   ?4. Loculated left-sided pleural effusion with left-sided pleural drainage catheter in position. Small right pleural effusion ?  ?07/13/2018 Surgery  ? OPERATION: ?1.  Left VATS (video-assisted thoracoscopic surgery) for drainage of loculated pleural effusion. ?2.  Talc pleurodesis for malignant pleural effusion. ?3.  Placement of PleurX catheter for management of malignant pleural effusion. ?4.  Placement of On-Q analgesia catheter system. ?   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 ?  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 ?  ?07/13/2018 Pathology Results  ? Pleura, peel, Left Pleural ?- FIBRO-FIBRINOUS PLEURITIS ?- NEGATIVE FOR MALIGNANCY ?  ?07/18/2018 Initial Diagnosis  ? Ovarian cancer, bilateral ([place]) ?  ?07/20/2018 Procedure  ? EGD  impression: ?Normal proximal esophagus and mid esophagus. ?Mild distal esophageal rings; dilation  not performed because of esophagitis. ?LA Grade C reflux esophagitis. ?Z-line regular, 30 cm from the incisors. ?5 cm hiatal hernia. ?Non-bleeding gastric ulcer with no stigmata of bleeding. ?Gastritis. ?Duodenal erosions without bleeding. ?Normal second portion of the duodenum. ?No specimens collected. ?  ?08/19/2018 Genetic Testing  ? Negative genetic testing on the common hereditary cancer panel.  The Common Hereditary Gene Panel offered by [facility] includes sequencing and/or deletion duplication testing of the following 48 genes: APC, ATM, AXIN2, BARD1, BMPR1A, BRCA1, BRCA2, BRIP1, CDH1, CDK4, CDKN2A (p14ARF), CDKN2A (p16INK4a), CHEK2, CTNNA1, DICER1, EPCAM (Deletion/duplication testing only), GREM1 (promoter region deletion/duplication testing only), KIT, MEN1, MLH1, MSH2, MSH3, MSH6, MUTYH, NBN, NF1, NHTL1, PALB2, PDGFRA, PMS2, POLD1, POLE, PTEN, RAD50, RAD51C, RAD51D, RNF43, SDHB, SDHC, SDHD, SMAD4, SMARCA4. STK11, TP53, TSC1, TSC2, and VHL.  The following genes were evaluated for sequence changes only: SDHA and HOXB13 c.251G>A variant only. The report date is August 19, 2018. ?  ?08/24/2018 - 03/15/2019 Chemotherapy  ? Patient is on Treatment Plan : OVARIAN Carboplatin (AUC 6) / Paclitaxel (175) q21d x 6 cycles  ?   ?03/05/2021 -  Chemotherapy  ? Patient is on Treatment Plan : OVARIAN Carboplatin (AUC 6) / Paclitaxel (175) q21d x 6 cycles  ?   ? ? ?CANCER STAGING: ? Cancer Staging  ?No matching staging information was found for the patient. ? ?INTERVAL HISTORY:  ?[name], a 51 y.o. female, returns for routine follow-up of her high-grade serous ovarian cancer. Serrita was last seen on 03/26/2021.  ? ?Today she is due for cycle #3 of Carboplatin and Taxol. She reports feeling well. She reports constipation. Her numbness in her fingertips at night is stable. She reports trouble swallowing. She reports pain her left flank and left breast which lasts 4 days following treatment and then improved. She reported  pain in her right ear which worsened when swallowing. Her taste has changed, and she reports now "everything tastes bad". ? ?Overall, she feel ready for her next cycle of chemotherapy.  ? ?REVIEW OF SYSTEMS:  ?Review of Systems  ?Constitutional:  Negative for appetite change and fatigue.  ?HENT:   Positive for trouble swallowing.   ?Respiratory:  Positive for cough and shortness of breath.   ?Gastrointestinal:  Positive for constipation.  ?Musculoskeletal:  Positive for flank pain (7/10 L).  ?Neurological:  Positive for headaches and numbness.  ?All other systems reviewed and are negative. ? ?PAST MEDICAL/SURGICAL HISTORY:  ?Past Medical History:  ?Diagnosis Date  ? Anemia   ? Anxiety and depression   ? Arthritis of facet joints at multiple vertebral levels   ? L5-S1  ? Constipation   ? Dyslipidemia   ? Family history of breast cancer   ? Family history of uterine cancer   ? GERD (gastroesophageal reflux disease)   ? History of hiatal hernia   ? History of kidney stones   ? Insomnia   ? Irritable bowel syndrome   ? Migraine   ? Muscle tension headache   ? Neuropathy of finger   ? Ovarian carcinoma ([place])   ? ovarian  ? Plantar fasciitis of right foot   ? Port-A-Cath in place 08/20/2018  ? ?Past Surgical History:  ?Procedure Laterality Date  ? ABDOMINAL HYSTERECTOMY    ? BIOPSY  10/27/2020  ? Procedure: BIOPSY;  Surgeon: Harvel Quale, MD;  [facility]: [facility];  Service: Gastroenterology;;  ? CHOLECYSTECTOMY  2008  ?  COLONOSCOPY N/A 08/13/2013  ? Procedure: COLONOSCOPY;  Surgeon: Rogene Houston, MD;  [facility];  Service: Endoscopy;  Laterality: N/A;  230-moved to 145 Ann to notify pt  ? COLONOSCOPY WITH PROPOFOL N/A 11/15/2020  ? Procedure: COLONOSCOPY WITH PROPOFOL;  Surgeon: Rogene Houston, MD;  [facility];  Service: Endoscopy;  Laterality: N/A;  1:40  ? ESOPHAGEAL DILATION N/A 02/28/2021  ? Procedure: ESOPHAGEAL DILATION;  Surgeon: Rogene Houston, MD;  [facility];  Service: Endoscopy;  Laterality: N/A;  ? ESOPHAGOGASTRODUODENOSCOPY    ? ESOPHAGOGASTRODUODENOSCOPY (EGD) WITH PROPOFOL N/A 07/20/2018  ? Procedure: ESOPHAGOGASTRODUODENOSCOPY (EGD) WITH PROPOFOL;  Surg

## 2021-04-16 NOTE — Patient Instructions (Addendum)
[place] [facility] ?Discharge Instructions ? ? ?You were seen and examined today by Dr. Delton Coombes. ? ?He reviewed the results of your lab work which are normal/stable.  ? ?We will proceed with your treatment today. ? ?We will repeat scans prior to your next cycle.  ? ?Return as scheduled in 3 weeks.  ? ? ?Thank you for choosing [facility] to provide your oncology and hematology care.  To afford each patient quality time with our provider, please arrive at least 15 minutes before your scheduled appointment time.  ? ?If you have a lab appointment with the [facility] please come in thru the Main Entrance and check in at the main information desk. ? ?You need to re-schedule your appointment should you arrive 10 or more minutes late.  We strive to give you quality time with our providers, and arriving late affects you and other patients whose appointments are after yours.  Also, if you no show three or more times for appointments you may be dismissed from the clinic at the providers discretion.     ?Again, thank you for choosing [facility].  Our hope is that these requests will decrease the amount of time that you wait before being seen by our physicians.       ?_____________________________________________________________ ? ?Should you have questions after your visit to [facility], please contact our office at [phone] and follow the prompts.  Our office hours are 8:00 a.m. and 4:30 p.m. Monday - Friday.  Please note that voicemails left after 4:00 p.m. may not be returned until the following business day.  We are closed weekends and major holidays.  You do have access to a nurse 24-7, just call the main number to the clinic [phone] and do not press any options, hold on the line and a nurse will answer the phone.   ? ?For prescription refill requests, have your pharmacy contact our office and allow 72 hours.    ? ?Due to Covid, you will need to wear a mask upon entering the hospital. If you do not have a mask, a mask will be given to you at the Main Entrance upon arrival. For doctor visits, patients may have 1 support person age 82 or older with them. For treatment visits, patients can not have anyone with them due to social distancing guidelines and our immunocompromised population.  ? ?   ?

## 2021-04-16 NOTE — Progress Notes (Signed)
Patient has been examined by Dr. Katragadda, and vital signs and labs have been reviewed. ANC, Creatinine, LFTs, hemoglobin, and platelets are within treatment parameters per M.D. - pt may proceed with treatment.    °

## 2021-04-17 LAB — CA 125: Cancer Antigen (CA) 125: 55.2 U/mL — ABNORMAL HIGH (ref 0.0–38.1)

## 2021-04-23 ENCOUNTER — Encounter (HOSPITAL_COMMUNITY): Payer: Self-pay

## 2021-04-23 ENCOUNTER — Other Ambulatory Visit (HOSPITAL_COMMUNITY): Payer: Self-pay

## 2021-04-23 MED ORDER — LEVOFLOXACIN 750 MG PO TABS: 750.0000 mg | ORAL_TABLET | Freq: Every day | ORAL | 0 refills | Status: DC

## 2021-04-26 ENCOUNTER — Emergency Department (HOSPITAL_COMMUNITY): Payer: Medicare Other

## 2021-04-26 ENCOUNTER — Other Ambulatory Visit: Payer: Self-pay

## 2021-04-26 ENCOUNTER — Encounter (HOSPITAL_COMMUNITY): Payer: Self-pay

## 2021-04-26 ENCOUNTER — Emergency Department (HOSPITAL_COMMUNITY)
Admission: EM | Admit: 2021-04-26 | Discharge: 2021-04-26 | Disposition: A | Discharge status: Home / Self Care | Payer: Medicare Other | Attending: Emergency Medicine | Admitting: Emergency Medicine

## 2021-04-26 DIAGNOSIS — J069 Acute upper respiratory infection, unspecified: Secondary | ICD-10-CM | POA: Diagnosis not present

## 2021-04-26 DIAGNOSIS — M546 Pain in thoracic spine: Secondary | ICD-10-CM | POA: Diagnosis not present

## 2021-04-26 DIAGNOSIS — R051 Acute cough: Secondary | ICD-10-CM | POA: Diagnosis not present

## 2021-04-26 DIAGNOSIS — Z20822 Contact with and (suspected) exposure to covid-19: Secondary | ICD-10-CM | POA: Insufficient documentation

## 2021-04-26 DIAGNOSIS — D701 Agranulocytosis secondary to cancer chemotherapy: Secondary | ICD-10-CM | POA: Insufficient documentation

## 2021-04-26 DIAGNOSIS — T451X5A Adverse effect of antineoplastic and immunosuppressive drugs, initial encounter: Secondary | ICD-10-CM | POA: Diagnosis not present

## 2021-04-26 DIAGNOSIS — C7960 Secondary malignant neoplasm of unspecified ovary: Secondary | ICD-10-CM | POA: Diagnosis not present

## 2021-04-26 DIAGNOSIS — R5383 Other fatigue: Secondary | ICD-10-CM | POA: Diagnosis present

## 2021-04-26 LAB — CBC WITH DIFFERENTIAL/PLATELET
Abs Immature Granulocytes: 0.01 10*3/uL (ref 0.00–0.07)
Basophils Absolute: 0 10*3/uL (ref 0.0–0.1)
Basophils Relative: 0 %
Eosinophils Absolute: 0 10*3/uL (ref 0.0–0.5)
Eosinophils Relative: 1 %
HCT: 32.3 % — ABNORMAL LOW (ref 36.0–46.0)
Hemoglobin: 10.6 g/dL — ABNORMAL LOW (ref 12.0–15.0)
Immature Granulocytes: 0 %
Lymphocytes Relative: 74 %
Lymphs Abs: 2.3 10*3/uL (ref 0.7–4.0)
MCH: 37.1 pg — ABNORMAL HIGH (ref 26.0–34.0)
MCHC: 32.8 g/dL (ref 30.0–36.0)
MCV: 112.9 fL — ABNORMAL HIGH (ref 80.0–100.0)
Monocytes Absolute: 0.4 10*3/uL (ref 0.1–1.0)
Monocytes Relative: 14 %
Neutro Abs: 0.4 10*3/uL — CL (ref 1.7–7.7)
Neutrophils Relative %: 11 %
Platelets: 231 10*3/uL (ref 150–400)
RBC: 2.86 MIL/uL — ABNORMAL LOW (ref 3.87–5.11)
RDW: 18 % — ABNORMAL HIGH (ref 11.5–15.5)
WBC: 3.2 10*3/uL — ABNORMAL LOW (ref 4.0–10.5)
nRBC: 0.6 % — ABNORMAL HIGH (ref 0.0–0.2)

## 2021-04-26 LAB — COMPREHENSIVE METABOLIC PANEL
ALT: 40 U/L (ref 0–44)
AST: 34 U/L (ref 15–41)
Albumin: 3.8 g/dL (ref 3.5–5.0)
Alkaline Phosphatase: 93 U/L (ref 38–126)
Anion gap: 8 (ref 5–15)
BUN: 23 mg/dL — ABNORMAL HIGH (ref 6–20)
CO2: 27 mmol/L (ref 22–32)
Calcium: 8.9 mg/dL (ref 8.9–10.3)
Chloride: 101 mmol/L (ref 98–111)
Creatinine, Ser: 0.65 mg/dL (ref 0.44–1.00)
GFR, Estimated: >60 mL/min (ref >60)
Glucose, Bld: 97 mg/dL (ref 70–99)
Potassium: 3.6 mmol/L (ref 3.5–5.1)
Sodium: 136 mmol/L (ref 135–145)
Total Bilirubin: 0.4 mg/dL (ref 0.3–1.2)
Total Protein: 6.9 g/dL (ref 6.5–8.1)

## 2021-04-26 LAB — RESP PANEL BY RT-PCR (FLU A&B, COVID) ARPGX2
Influenza A by PCR: NEGATIVE
Influenza B by PCR: NEGATIVE
SARS Coronavirus 2 by RT PCR: NEGATIVE

## 2021-04-26 LAB — TROPONIN I (HIGH SENSITIVITY): Troponin I (High Sensitivity): 4 ng/L (ref <18)

## 2021-04-26 MED ORDER — SODIUM CHLORIDE 0.9 % IV BOLUS
1000.0000 mL | Freq: Once | INTRAVENOUS | Status: AC
  Administered 2021-04-26: 1000 mL via INTRAVENOUS

## 2021-04-26 MED ORDER — OXYCODONE HCL 5 MG PO TABS
20.0000 mg | ORAL_TABLET | Freq: Once | ORAL | Status: AC
  Administered 2021-04-26: 20 mg via ORAL
  Filled 2021-04-26: qty 4

## 2021-04-26 MED ORDER — ALBUTEROL SULFATE (2.5 MG/3ML) 0.083% IN NEBU
5.0000 mg | INHALATION_SOLUTION | Freq: Once | RESPIRATORY_TRACT | Status: AC
  Administered 2021-04-26: 5 mg via RESPIRATORY_TRACT
  Filled 2021-04-26: qty 6

## 2021-04-26 MED ORDER — TBO-FILGRASTIM 480 MCG/0.8ML ~~LOC~~ SOSY
480.0000 ug | PREFILLED_SYRINGE | Freq: Once | SUBCUTANEOUS | Status: AC
  Administered 2021-04-26: 480 ug via SUBCUTANEOUS
  Filled 2021-04-26: qty 0.8

## 2021-04-26 MED ORDER — ALBUTEROL SULFATE (2.5 MG/3ML) 0.083% IN NEBU
INHALATION_SOLUTION | RESPIRATORY_TRACT | Status: AC
  Filled 2021-04-26: qty 3

## 2021-04-26 NOTE — ED Triage Notes (Signed)
Patient with complaints of cold like symptoms for a week.  ?

## 2021-04-26 NOTE — Discharge Instructions (Signed)
If you develop high fever, severe cough or cough with blood, trouble breathing, severe headache, neck pain/stiffness, vomiting, or any other new/concerning symptoms then return to the ER for evaluation  

## 2021-04-26 NOTE — ED Provider Notes (Signed)
[facility] ?Provider Note ? ? ?CSN: [id number] ?Arrival date & time: 04/26/21  1050 ? ?  ? ?History ? ?Chief Complaint  ?Patient presents with  ? URI  ? ? ?Michelle Holt is a 51 y.o. female. ? ?HPI ?51 year old female with a history of metastatic ovarian cancer presents with cough and fatigue.  Her oncologist, Dr. Delton Coombes, recommended she come in for evaluation.  She had chemotherapy on 4/3 and on 4/5 she started feeling sick.  She has had cough that is nonproductive, fatigue, and now is having chest and back pain in her thoracic back from where she has been coughing.  She has chronic abdominal pain.  No fevers during this time.  Has body aches but that also is a recurrent issue.  She called her oncologist and was put on Levaquin on 4/10 but still is not feeling well.  She has been using her inhaler. Has been occasionally coughing yellow sputum. ? ?Home Medications ?Prior to Admission medications   ?Medication Sig Start Date End Date Taking? Authorizing Provider  ?levofloxacin (LEVAQUIN) 750 MG tablet Take 1 tablet (750 mg total) by mouth daily. 04/23/21   Derek Jack, MD  ?albuterol (VENTOLIN HFA) 108 (90 Base) MCG/ACT inhaler Inhale 2 puffs into the lungs every 6 (six) hours as needed for wheezing or shortness of breath.    [provider]  ?B Complex-C (B-COMPLEX WITH VITAMIN C) tablet Take 1 tablet by mouth daily.    [provider]  ?Cholecalciferol (VITAMIN D) 50 MCG (2000 UT) CAPS Take 2,000 Units by mouth daily.    [provider]  ?dexamethasone (DECADRON) 4 MG tablet Take 1 tablet (4 mg total) by mouth daily. Three days after each treatment 03/05/21   Derek Jack, MD  ?diazepam (VALIUM) 5 MG tablet TAKE ONE TABLET BY MOUTH AT BEDTIME 11/27/20   Derek Jack, MD  ?dicyclomine (BENTYL) 10 MG capsule Take 1 capsule (10 mg total) by mouth 3 (three) times daily as needed for spasms. 03/05/21   Derek Jack, MD  ?fluconazole  (DIFLUCAN) 100 MG tablet Take 2 tablets by mouth on the first day and then 1 tablet by mouth daily. ?Patient not taking: Reported on 04/26/2021 04/16/21   Derek Jack, MD  ?gabapentin (NEURONTIN) 100 MG capsule TAKE TWO (2) CAPSULES BY MOUTH AT BEDTIME 01/29/21   Derek Jack, MD  ?lactulose (CHRONULAC) 10 GM/15ML solution Take 15 mLs (10 g total) by mouth as needed. 15 mls every 3 hours until bowel movement achieved, then take daily. 03/20/21   Derek Jack, MD  ?lidocaine-prilocaine (EMLA) cream Apply 1 application topically as needed (pain).    [provider]  ?linaclotide Rolan Lipa) 145 MCG CAPS capsule Take 1 capsule (145 mcg total) by mouth daily before breakfast. 04/16/21   Derek Jack, MD  ?Magnesium 500 MG TABS Take 500 mg by mouth in the morning.    [provider]  ?methylPREDNISolone (MEDROL DOSEPAK) 4 MG TBPK tablet Take by mouth as directed. ?Patient not taking: Reported on 04/26/2021 03/07/21   Derek Jack, MD  ?metoCLOPramide (REGLAN) 10 MG tablet Take 1 tablet (10 mg total) by mouth 3 (three) times daily before meals. 02/28/21   Rogene Houston, MD  ?omeprazole (PRILOSEC) 40 MG capsule Take 1 capsule (40 mg total) by mouth 2 (two) times daily before a meal. 02/28/21   Rehman, Mechele Dawley, MD  ?ondansetron (ZOFRAN) 4 MG tablet Take 1 tablet (4 mg total) by mouth every 8 (eight) hours as needed  for nausea or vomiting. 02/05/21   Derek Jack, MD  ?Oxycodone HCl 20 MG TABS Take 1 tablet (20 mg total) by mouth every 4 (four) hours as needed. 03/27/21   Derek Jack, MD  ?sennosides-docusate sodium (SENOKOT-S) 8.6-50 MG tablet Take 1 tablet by mouth daily as needed for constipation.    [provider]  ?SUMAtriptan (IMITREX) 100 MG tablet TAKE ONE TABLET BY MOUTH PRN UP to TWICE DAILY AS NEEDED. 08/12/18   Suzzanne Cloud, NP  ?tiZANidine (ZANAFLEX) 4 MG tablet Take 1 tablet (4 mg total) by mouth 3 (three) times daily as needed for  muscle spasms. 01/10/21   Aundra Dubin, PA-C  ?triamcinolone cream (KENALOG) 0.1 % Apply 1 application topically 2 (two) times daily as needed (rash). 06/28/20   Derek Jack, MD  ?venlafaxine XR (EFFEXOR-XR) 150 MG 24 hr capsule TAKE ONE CAPSULE BY MOUTH DAILY WITH BREAKFAST 01/22/21   Derek Jack, MD  ?   ? ?Allergies    ?Morphine and related, Nickel, Nortriptyline, Topamax [topiramate], Xanax [alprazolam], Actifed cold-allergy [chlorpheniramine-phenyleph er], Amoxicillin, Codeine, Erythromycin, Penicillins, and Sudafed [pseudoephedrine hcl]   ? ?Review of Systems   ?Review of Systems  ?Constitutional:  Positive for fatigue. Negative for fever.  ?Respiratory:  Positive for cough and shortness of breath.   ?Cardiovascular:  Positive for chest pain.  ?Gastrointestinal:  Positive for abdominal pain. Negative for vomiting.  ?Neurological:  Negative for headaches.  ? ?Physical Exam ?Updated Vital Signs ?BP (!) 101/53   Pulse 88   Temp 97.9 ?F (36.6 ?C) (Oral)   Resp 14   Ht '5\' 3"'$  (1.6 m)   Wt 81.6 kg   LMP  (LMP Unknown)   SpO2 99%   BMI 31.89 kg/m?  ?Physical Exam ?Vitals and nursing note reviewed.  ?Constitutional:   ?   Appearance: She is well-developed.  ?HENT:  ?   Head: Normocephalic and atraumatic.  ?Cardiovascular:  ?   Rate and Rhythm: Normal rate and regular rhythm.  ?   Heart sounds: Normal heart sounds.  ?Pulmonary:  ?   Effort: Pulmonary effort is normal.  ?   Breath sounds: Normal breath sounds. No wheezing.  ?Abdominal:  ?   Palpations: Abdomen is soft.  ?   Tenderness: There is abdominal tenderness (mild, generalized).  ?Musculoskeletal:  ?   Thoracic back: Tenderness present.  ?     Back: ? ?   Comments: Diffuse thoracic tenderness. No rash  ?Skin: ?   General: Skin is warm and dry.  ?Neurological:  ?   Mental Status: She is alert.  ? ? ?ED Results / Procedures / Treatments   ?Labs ?(all labs ordered are listed, but only abnormal results are displayed) ?Labs Reviewed   ?COMPREHENSIVE METABOLIC PANEL - Abnormal; Notable for the following components:  ?    Result Value  ? BUN 23 (*)   ? All other components within normal limits  ?CBC WITH DIFFERENTIAL/PLATELET - Abnormal; Notable for the following components:  ? WBC 3.2 (*)   ? RBC 2.86 (*)   ? Hemoglobin 10.6 (*)   ? HCT 32.3 (*)   ? MCV 112.9 (*)   ? MCH 37.1 (*)   ? RDW 18.0 (*)   ? nRBC 0.6 (*)   ? Neutro Abs 0.4 (*)   ? All other components within normal limits  ?RESP PANEL BY RT-PCR (FLU A&B, COVID) [id number]  ?CULTURE, BLOOD (ROUTINE X 2)  ?CULTURE, BLOOD (ROUTINE X 2)  ?PATHOLOGIST SMEAR REVIEW  ?  TROPONIN I (HIGH SENSITIVITY)  ? ? ?EKG ?EKG Interpretation ? ?Date/Time:  Thursday April 26 2021 11:16:57 EDT ?Ventricular Rate:  79 ?PR Interval:  141 ?QRS Duration: 98 ?QT Interval:  366 ?QTC Calculation: 420 ?R Axis:   63 ?Text Interpretation: Sinus rhythm no acute ST/T changes Confirmed by Sherwood Gambler [phone]) on 04/26/2021 12:04:37 PM ? ?Radiology ?DG Chest 2 View ? ?Result Date: 04/26/2021 ?CLINICAL DATA:  Cough.  Ovarian cancer. EXAM: CHEST - 2 VIEW COMPARISON:  04/20/2020 FINDINGS: The lungs are clear without focal pneumonia, edema, pneumothorax or pleural effusion. The cardiopericardial silhouette is within normal limits for size. Moderate hiatal hernia. Right Port-A-Cath again noted. Telemetry leads overlie the chest. IMPRESSION: No active cardiopulmonary disease. Electronically Signed   By: Misty Stanley M.D.   On: 04/26/2021 12:14   ? ?Procedures ?Procedures  ? ? ?Medications Ordered in ED ?Medications  ?albuterol (PROVENTIL) (2.5 MG/3ML) 0.083% nebulizer solution (  Not Given 04/26/21 1315)  ?sodium chloride 0.9 % bolus 1,000 mL (0 mLs Intravenous Stopped 04/26/21 1327)  ?oxyCODONE (Oxy IR/ROXICODONE) immediate release tablet 20 mg (20 mg Oral Given 04/26/21 1228)  ?Tbo-Filgrastim (GRANIX) injection 480 mcg (480 mcg Subcutaneous Given 04/26/21 1428)  ?albuterol (PROVENTIL) (2.5 MG/3ML) 0.083% nebulizer solution 5 mg (5 mg  Nebulization Given 04/26/21 1312)  ? ? ?ED Course/ Medical Decision Making/ A&P ?  ?                        ? ?Chart reviewed.  Chest x-ray images viewed and there is no pneumonia.  She was given her chronic pain medicine for

## 2021-04-27 ENCOUNTER — Other Ambulatory Visit (HOSPITAL_COMMUNITY): Payer: Self-pay

## 2021-04-27 ENCOUNTER — Encounter (HOSPITAL_COMMUNITY): Payer: Self-pay

## 2021-04-27 LAB — PATHOLOGIST SMEAR REVIEW

## 2021-04-27 MED ORDER — OXYCODONE HCL 20 MG PO TABS: 1.0000 | ORAL_TABLET | ORAL | 0 refills | Status: DC | PRN

## 2021-04-30 ENCOUNTER — Encounter (HOSPITAL_COMMUNITY): Payer: Self-pay

## 2021-04-30 ENCOUNTER — Other Ambulatory Visit (HOSPITAL_COMMUNITY): Payer: Self-pay | Admitting: Hematology

## 2021-05-01 ENCOUNTER — Encounter (HOSPITAL_COMMUNITY): Payer: Self-pay | Admitting: Hematology

## 2021-05-01 ENCOUNTER — Telehealth (HOSPITAL_COMMUNITY): Payer: Self-pay | Admitting: *Deleted

## 2021-05-01 LAB — CULTURE, BLOOD (ROUTINE X 2)
Culture: NO GROWTH
Culture: NO GROWTH
Special Requests: ADEQUATE
Special Requests: ADEQUATE

## 2021-05-01 NOTE — Telephone Encounter (Signed)
Reached out to patient regarding ongoing symptoms, despite completing course of levaquin.  States that she is still coughing up yellow sputum, voice is hoarse, fatigue and generally does not feel well.  She is currently taking allergy medication in addition to Mucinex without relief of symptoms. Patient advised to make appointment today to see PCP and request a repeat CXR.  If she is unable to see PCP needs to go to Urgent Care.  Verbalized understanding. ?

## 2021-05-01 NOTE — Telephone Encounter (Signed)
I am going to call her and find out why she is requesting a refill on this.  It was filled on 4/10.  Hopefully she is not still having symptoms.

## 2021-05-02 ENCOUNTER — Telehealth (HOSPITAL_COMMUNITY): Payer: Self-pay | Admitting: *Deleted

## 2021-05-02 NOTE — Telephone Encounter (Signed)
Called patient to follow up on conversation from yesterday.  Respiratory symptoms have worsened since yesterday and she was difficult to understand due to hoarseness and congestion.  States that she did not feel like going to PCP yesterday, as she felt too bad.  Advised her to go to Urgent care ASAP, as she will end up in the hospital if she does not get treatment.  Verbalized understanding and states that she will go.  Will follow up. ?

## 2021-05-03 ENCOUNTER — Other Ambulatory Visit: Payer: Self-pay | Admitting: *Deleted

## 2021-05-03 DIAGNOSIS — R058 Other specified cough: Secondary | ICD-10-CM

## 2021-05-04 ENCOUNTER — Ambulatory Visit (HOSPITAL_COMMUNITY)
Admission: RE | Admit: 2021-05-04 | Discharge: 2021-05-04 | Disposition: A | Discharge status: Home / Self Care | Payer: Medicare Other | Source: Ambulatory Visit | Attending: Hematology | Admitting: Hematology

## 2021-05-04 DIAGNOSIS — C563 Malignant neoplasm of bilateral ovaries: Secondary | ICD-10-CM | POA: Diagnosis present

## 2021-05-04 DIAGNOSIS — R058 Other specified cough: Secondary | ICD-10-CM

## 2021-05-04 MED ORDER — HEPARIN SOD (PORK) LOCK FLUSH 100 UNIT/ML IV SOLN
INTRAVENOUS | Status: AC
  Filled 2021-05-04: qty 5

## 2021-05-04 MED ORDER — HEPARIN SOD (PORK) LOCK FLUSH 100 UNIT/ML IV SOLN
500.0000 [IU] | Freq: Once | INTRAVENOUS | Status: AC
  Administered 2021-05-04: 500 [IU] via INTRAVENOUS

## 2021-05-04 MED ORDER — IOHEXOL 300 MG/ML  SOLN
100.0000 mL | Freq: Once | INTRAMUSCULAR | Status: AC | PRN
  Administered 2021-05-04: 100 mL via INTRAVENOUS

## 2021-05-07 ENCOUNTER — Inpatient Hospital Stay (HOSPITAL_COMMUNITY): Payer: Medicare Other

## 2021-05-07 ENCOUNTER — Inpatient Hospital Stay (HOSPITAL_BASED_OUTPATIENT_CLINIC_OR_DEPARTMENT_OTHER): Payer: Medicare Other | Admitting: Hematology

## 2021-05-07 VITALS — BP 114/74 | HR 88 | Temp 97.8°F | Resp 18

## 2021-05-07 DIAGNOSIS — Z5111 Encounter for antineoplastic chemotherapy: Secondary | ICD-10-CM | POA: Diagnosis not present

## 2021-05-07 DIAGNOSIS — C563 Malignant neoplasm of bilateral ovaries: Secondary | ICD-10-CM

## 2021-05-07 DIAGNOSIS — Z95828 Presence of other vascular implants and grafts: Secondary | ICD-10-CM

## 2021-05-07 LAB — COMPREHENSIVE METABOLIC PANEL
ALT: 18 U/L (ref 0–44)
AST: 20 U/L (ref 15–41)
Albumin: 3.4 g/dL — ABNORMAL LOW (ref 3.5–5.0)
Alkaline Phosphatase: 108 U/L (ref 38–126)
Anion gap: 7 (ref 5–15)
BUN: 14 mg/dL (ref 6–20)
CO2: 27 mmol/L (ref 22–32)
Calcium: 9 mg/dL (ref 8.9–10.3)
Chloride: 105 mmol/L (ref 98–111)
Creatinine, Ser: 0.56 mg/dL (ref 0.44–1.00)
GFR, Estimated: >60 mL/min (ref >60)
Glucose, Bld: 142 mg/dL — ABNORMAL HIGH (ref 70–99)
Potassium: 3.7 mmol/L (ref 3.5–5.1)
Sodium: 139 mmol/L (ref 135–145)
Total Bilirubin: 0.5 mg/dL (ref 0.3–1.2)
Total Protein: 6.9 g/dL (ref 6.5–8.1)

## 2021-05-07 LAB — CBC WITH DIFFERENTIAL/PLATELET
Abs Immature Granulocytes: 0.02 10*3/uL (ref 0.00–0.07)
Basophils Absolute: 0 10*3/uL (ref 0.0–0.1)
Basophils Relative: 0 %
Eosinophils Absolute: 0 10*3/uL (ref 0.0–0.5)
Eosinophils Relative: 0 %
HCT: 32.6 % — ABNORMAL LOW (ref 36.0–46.0)
Hemoglobin: 10.7 g/dL — ABNORMAL LOW (ref 12.0–15.0)
Immature Granulocytes: 0 %
Lymphocytes Relative: 25 %
Lymphs Abs: 1.4 10*3/uL (ref 0.7–4.0)
MCH: 38.1 pg — ABNORMAL HIGH (ref 26.0–34.0)
MCHC: 32.8 g/dL (ref 30.0–36.0)
MCV: 116 fL — ABNORMAL HIGH (ref 80.0–100.0)
Monocytes Absolute: 0.3 10*3/uL (ref 0.1–1.0)
Monocytes Relative: 6 %
Neutro Abs: 3.7 10*3/uL (ref 1.7–7.7)
Neutrophils Relative %: 69 %
Platelets: 159 10*3/uL (ref 150–400)
RBC: 2.81 MIL/uL — ABNORMAL LOW (ref 3.87–5.11)
RDW: 18.5 % — ABNORMAL HIGH (ref 11.5–15.5)
WBC: 5.5 10*3/uL (ref 4.0–10.5)
nRBC: 0 % (ref 0.0–0.2)

## 2021-05-07 LAB — MAGNESIUM: Magnesium: 1.5 mg/dL — ABNORMAL LOW (ref 1.7–2.4)

## 2021-05-07 MED ORDER — DIPHENHYDRAMINE HCL 50 MG/ML IJ SOLN
50.0000 mg | Freq: Once | INTRAMUSCULAR | Status: AC
  Administered 2021-05-07: 50 mg via INTRAVENOUS
  Filled 2021-05-07: qty 1

## 2021-05-07 MED ORDER — SODIUM CHLORIDE 0.9 % IV SOLN
10.0000 mg | Freq: Once | INTRAVENOUS | Status: AC
  Administered 2021-05-07: 10 mg via INTRAVENOUS
  Filled 2021-05-07: qty 10

## 2021-05-07 MED ORDER — SODIUM CHLORIDE 0.9 % IV SOLN
661.0000 mg | Freq: Once | INTRAVENOUS | Status: AC
  Administered 2021-05-07: 660 mg via INTRAVENOUS
  Filled 2021-05-07: qty 66

## 2021-05-07 MED ORDER — SODIUM CHLORIDE 0.9 % IV SOLN
175.0000 mg/m2 | Freq: Once | INTRAVENOUS | Status: AC
  Administered 2021-05-07: 336 mg via INTRAVENOUS
  Filled 2021-05-07: qty 56

## 2021-05-07 MED ORDER — MAGNESIUM SULFATE 2 GM/50ML IV SOLN
2.0000 g | Freq: Once | INTRAVENOUS | Status: AC
Start: 2021-05-07 — End: 2021-05-07
  Administered 2021-05-07: 2 g via INTRAVENOUS
  Filled 2021-05-07: qty 50

## 2021-05-07 MED ORDER — SODIUM CHLORIDE 0.9 % IV SOLN
150.0000 mg | Freq: Once | INTRAVENOUS | Status: AC
  Administered 2021-05-07: 150 mg via INTRAVENOUS
  Filled 2021-05-07: qty 150

## 2021-05-07 MED ORDER — SODIUM CHLORIDE 0.9% FLUSH
10.0000 mL | INTRAVENOUS | Status: DC | PRN
  Administered 2021-05-07 (×2): 10 mL

## 2021-05-07 MED ORDER — LORAZEPAM 2 MG/ML IJ SOLN
0.5000 mg | Freq: Once | INTRAMUSCULAR | Status: AC
  Administered 2021-05-07: 0.5 mg via INTRAVENOUS
  Filled 2021-05-07: qty 1

## 2021-05-07 MED ORDER — HEPARIN SOD (PORK) LOCK FLUSH 100 UNIT/ML IV SOLN
500.0000 [IU] | Freq: Once | INTRAVENOUS | Status: AC | PRN
  Administered 2021-05-07: 500 [IU]

## 2021-05-07 MED ORDER — PALONOSETRON HCL INJECTION 0.25 MG/5ML
0.2500 mg | Freq: Once | INTRAVENOUS | Status: AC
  Administered 2021-05-07: 0.25 mg via INTRAVENOUS
  Filled 2021-05-07: qty 5

## 2021-05-07 MED ORDER — LINACLOTIDE 145 MCG PO CAPS
290.0000 ug | ORAL_CAPSULE | Freq: Every day | ORAL | 2 refills | Status: DC
Start: 2021-05-07 — End: 2021-05-09

## 2021-05-07 MED ORDER — FAMOTIDINE IN NACL 20-0.9 MG/50ML-% IV SOLN
20.0000 mg | Freq: Once | INTRAVENOUS | Status: AC
  Administered 2021-05-07: 20 mg via INTRAVENOUS
  Filled 2021-05-07: qty 50

## 2021-05-07 MED ORDER — SODIUM CHLORIDE 0.9 % IV SOLN: Freq: Once | INTRAVENOUS | Status: AC

## 2021-05-07 NOTE — Progress Notes (Signed)
? ?[facility] ?[address] ?[place], [place] [address] ? ? ?CLINIC:  ?Medical Oncology/Hematology ? ?PCP:  ?[name], MD ?[place] / [place] [address] ?[phone] ? ? ?REASON FOR VISIT:  ?Follow-up for high-grade serous ovarian cancer ? ?PRIOR THERAPY:  ?1. Carboplatin and paclitaxel x 7 cycles from 08/24/2018 to 03/15/2019. ?2. Laparoscopic TAH & BSO & omenectomy on 12/24/2018. ? ?NGS Results: not done ? ?CURRENT THERAPY: Carboplatin (AUC 6) / Paclitaxel (175) q21d x 6 cycles ? ?BRIEF ONCOLOGIC HISTORY:  ?Oncology History  ?Ovarian cancer, bilateral ([place])  ?07/07/2018 Pathology Results  ? PLEURAL FLUID, LEFT (SPECIMEN 1 OF 1, COLLECTED 07/07/18): ?- MALIGNANT CELLS CONSISTENT WITH ADENOCARCINOMA ?- SEE COMMENT ? ?Source ?Pleural Fluid, (Specimen 1 of 1, collected on 07/07/2018) ?Gross ?Specimen: Received is/are 1000cc of bloody red fluid with tissue. (TC:tc) ?Prepared: ?# Smears: 0 ?# Concentration Technique Slides (i.e. ThinPrep): 1 ?# Cell Block: 1 Conventional ?Additional Studies: Two Hematology slides labeled [id number] ?Comment ?The malignant cells are positive for cytokeratin 7, p53, WT-1, Pax-8, Moc31, ER (weak) and EMA but negative for cytokeratin 20, TTF-1, GATA-3, CDX-2 and D2-40. Overall, the phenotype is consistent with a gynecologic primary; clinical correlation recommended. ?  ?07/07/2018 Procedure  ? Successful ultrasound guided left thoracentesis yielding 2.0 L of pleural fluid ?  ?07/08/2018 Procedure  ? 1. Technically successful placement of left 14 French pigtail chest drain, placed to Pleur-evac water-seal. ?  ?07/08/2018 Procedure  ? 1. Technically successful five [place] double lumen power injectable PICC placement ?  ?07/09/2018 Imaging  ? Ct chest ?1. There is a moderate, loculated left hydropneumothorax with a small air component and moderate fluid component. The largest loculated component is located posteriorly. There is a pigtail drainage catheter about the lateral pleural  space. There is no obvious etiology, such as obvious mass or pleural disease. ?  ?2. There is a small right pleural effusion with associated atelectasis or consolidation and a subpleural consolidation of the superior segment right lower lobe (series 4, image 56), of uncertain significance, possibly infectious or inflammatory ?  ?07/10/2018 Imaging  ? Ct abdomen and pelvis: ?1. The bilateral ovaries are enlarged by heterogeneous appearing cystic lesions, measuring 5.3 x 4.2 cm on the right (series 4, image 72) and 4.5 x 3.2 cm on the left (series 4, image 75). Consider dedicated pelvic ultrasound and/or pelvic MRI to further evaluate for solid components given high suspicion for GYN primary malignancy. ?  ?2. No other evidence of mass and no lymphadenopathy in the abdomen or pelvis. ?  ?3. Trace ascites. There is some suggestion of omental and peritoneal nodularity (e.g. Series 4, image 49), concerning for peritoneal metastatic disease. ?   ?4. Loculated left-sided pleural effusion with left-sided pleural drainage catheter in position. Small right pleural effusion ?  ?07/13/2018 Surgery  ? OPERATION: ?1.  Left VATS (video-assisted thoracoscopic surgery) for drainage of loculated pleural effusion. ?2.  Talc pleurodesis for malignant pleural effusion. ?3.  Placement of PleurX catheter for management of malignant pleural effusion. ?4.  Placement of On-Q analgesia catheter system. ?   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 ?  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 ?  ?07/13/2018 Pathology Results  ? Pleura, peel, Left Pleural ?- FIBRO-FIBRINOUS PLEURITIS ?- NEGATIVE FOR MALIGNANCY ?  ?07/18/2018 Initial Diagnosis  ? Ovarian cancer, bilateral ([place]) ?  ?07/20/2018 Procedure  ? EGD  impression: ?Normal proximal esophagus and mid esophagus. ?Mild distal esophageal rings; dilation  not performed because of esophagitis. ?LA Grade C reflux esophagitis. ?Z-line regular, 30 cm from the incisors. ?5 cm hiatal hernia. ?Non-bleeding gastric ulcer with no stigmata of bleeding. ?Gastritis. ?Duodenal erosions without bleeding. ?Normal second portion of the duodenum. ?No specimens collected. ?  ?08/19/2018 Genetic Testing  ? Negative genetic testing on the common hereditary cancer panel.  The Common Hereditary Gene Panel offered by [facility] includes sequencing and/or deletion duplication testing of the following 48 genes: APC, ATM, AXIN2, BARD1, BMPR1A, BRCA1, BRCA2, BRIP1, CDH1, CDK4, CDKN2A (p14ARF), CDKN2A (p16INK4a), CHEK2, CTNNA1, DICER1, EPCAM (Deletion/duplication testing only), GREM1 (promoter region deletion/duplication testing only), KIT, MEN1, MLH1, MSH2, MSH3, MSH6, MUTYH, NBN, NF1, NHTL1, PALB2, PDGFRA, PMS2, POLD1, POLE, PTEN, RAD50, RAD51C, RAD51D, RNF43, SDHB, SDHC, SDHD, SMAD4, SMARCA4. STK11, TP53, TSC1, TSC2, and VHL.  The following genes were evaluated for sequence changes only: SDHA and HOXB13 c.251G>A variant only. The report date is August 19, 2018. ?  ?08/24/2018 - 03/15/2019 Chemotherapy  ? Patient is on Treatment Plan : OVARIAN Carboplatin (AUC 6) / Paclitaxel (175) q21d x 6 cycles  ? ?  ?  ?03/05/2021 -  Chemotherapy  ? Patient is on Treatment Plan : OVARIAN Carboplatin (AUC 6) / Paclitaxel (175) q21d x 6 cycles  ? ?  ?  ? ? ?CANCER STAGING: ? Cancer Staging  ?No matching staging information was found for the patient. ? ?INTERVAL HISTORY:  ?Ms. AMELIYA NICOTRA, a 51 y.o. female, returns for routine follow-up and consideration for next cycle of chemotherapy. Vivika was last seen on 04/16/2021. ? ?Due for cycle #4 of Carboplatin and Taxol today.  ? ?Overall, she tells me she has been feeling pretty well. She reports congestion over the past week from which she continues to have a dry cough at night. She reports urinary incontinence. She reports flushing for 1 day following treatments and  constipation for 1 week following treatment for which she takes Colace BID as well as lactulose every 3 hours. She continues to take 4-5 tablets of oxycodone daily. Her numbness in her fingers at night is stable. She continues to have trouble swallowing. She reports sharp abdominal pain while coughing.  ? ?Overall, she feels ready for next cycle of chemo today.  ? ? ?REVIEW OF SYSTEMS:  ?Review of Systems  ?Constitutional:  Negative for appetite change and fatigue.  ?HENT:   Positive for trouble swallowing.   ?Respiratory:  Positive for cough (dry) and shortness of breath.   ?Gastrointestinal:  Positive for abdominal pain (with cough) and constipation.  ?Genitourinary:  Positive for bladder incontinence.   ?Musculoskeletal:  Positive for arthralgias (6/10 all over).  ?Neurological:  Positive for headaches and numbness (fingers - stable).  ?All other systems reviewed and are negative. ? ?PAST MEDICAL/SURGICAL HISTORY:  ?Past Medical History:  ?Diagnosis Date  ? Anemia   ? Anxiety and depression   ? Arthritis of facet joints at multiple vertebral levels   ? L5-S1  ? Constipation   ? Dyslipidemia   ? Family history of breast cancer   ? Family history of uterine cancer   ? GERD (gastroesophageal reflux disease)   ? History of hiatal hernia   ? History of kidney stones   ? Insomnia   ? Irritable bowel syndrome   ? Migraine   ? Muscle tension headache   ? Neuropathy of finger   ? Ovarian carcinoma ([place])   ? ovarian  ? Plantar  fasciitis of right foot   ? Port-A-Cath in place 08/20/2018  ? ?Past Surgical History:  ?Procedure Laterality Date  ? ABDOMINAL HYSTERECTOMY    ? BIOPSY  10/27/2020  ? Procedure: BIOPSY;  Surgeon: Harvel Quale, MD;  [facility];  Service: Gastroenterology;;  ? CHOLECYSTECTOMY  2008  ? COLONOSCOPY N/A 08/13/2013  ? Procedure: COLONOSCOPY;  Surgeon: Rogene Houston, MD;  Location: [facility];  Service: Endoscopy;  Laterality: N/A;  230-moved to 145 Ann to notify pt  ?  COLONOSCOPY WITH PROPOFOL N/A 11/15/2020  ? Procedure: COLONOSCOPY WITH PROPOFOL;  Surgeon: Rogene Houston, MD;  [facility]: [facility];  Service: Endoscopy;  Laterality: N/A;  1:40  ? ESOPHAGEAL DILATION N/A 2/

## 2021-05-07 NOTE — Patient Instructions (Signed)
[facility]  Discharge Instructions: ?Thank you for choosing [facility] to provide your oncology and hematology care.  ?If you have a lab appointment with the [facility], please come in thru the Main Entrance and check in at the main information desk. ? ?Wear comfortable clothing and clothing appropriate for easy access to any Portacath or PICC line.  ? ?We strive to give you quality time with your provider. You may need to reschedule your appointment if you arrive late (15 or more minutes).  Arriving late affects you and other patients whose appointments are after yours.  Also, if you miss three or more appointments without notifying the office, you may be dismissed from the clinic at the provider?s discretion.    ?  ?For prescription refill requests, have your pharmacy contact our office and allow 72 hours for refills to be completed.   ? ?Today you received the following chemotherapy and/or immunotherapy agents Carbo/Taxol, and 2g of Magnesium sulfate, return as scheduled. ?  ?To help prevent nausea and vomiting after your treatment, we encourage you to take your nausea medication as directed. ? ?BELOW ARE SYMPTOMS THAT SHOULD BE REPORTED IMMEDIATELY: ?*FEVER GREATER THAN 100.4 F (38 ?C) OR HIGHER ?*CHILLS OR SWEATING ?*NAUSEA AND VOMITING THAT IS NOT CONTROLLED WITH YOUR NAUSEA MEDICATION ?*UNUSUAL SHORTNESS OF BREATH ?*UNUSUAL BRUISING OR BLEEDING ?*URINARY PROBLEMS (pain or burning when urinating, or frequent urination) ?*BOWEL PROBLEMS (unusual diarrhea, constipation, pain near the anus) ?TENDERNESS IN MOUTH AND THROAT WITH OR WITHOUT PRESENCE OF ULCERS (sore throat, sores in mouth, or a toothache) ?UNUSUAL RASH, SWELLING OR PAIN  ?UNUSUAL VAGINAL DISCHARGE OR ITCHING  ? ?Items with * indicate a potential emergency and should be followed up as soon as possible or go to the Emergency Department if any problems should occur. ? ?Please show the CHEMOTHERAPY ALERT CARD or  IMMUNOTHERAPY ALERT CARD at check-in to the Emergency Department and triage nurse. ? ?Should you have questions after your visit or need to cancel or reschedule your appointment, please contact [facility] [phone]  and follow the prompts.  Office hours are 8:00 a.m. to 4:30 p.m. Monday - Friday. Please note that voicemails left after 4:00 p.m. may not be returned until the following business day.  We are closed weekends and major holidays. You have access to a nurse at all times for urgent questions. Please call the main number to the clinic [phone] and follow the prompts. ? ?For any non-urgent questions, you may also contact your provider using MyChart. We now offer e-Visits for anyone 101 and older to request care online for non-urgent symptoms. For details visit [email or web address]. ?  ?Also download the MyChart app! Go to the app store, search "MyChart", open the app, select [facility], and log in with your MyChart username and password. ? ?Due to Covid, a mask is required upon entering the hospital/clinic. If you do not have a mask, one will be given to you upon arrival. For doctor visits, patients may have 1 support person aged 23 or older with them. For treatment visits, patients cannot have anyone with them due to current Covid guidelines and our immunocompromised population.  ?

## 2021-05-07 NOTE — Progress Notes (Signed)
Patient presents today for treatment. Patient and labs assessed by Dr. Delton Coombes, okay to proceed with treatment per Dr. Delton Coombes. Magnesium 1.5, received orders for 2g of magnesium sulfate. ?Patient tolerated chemotherapy with no complaints voiced. Side effects with management reviewed understanding verbalized. Port site clean and dry with no bruising or swelling noted at site. Good blood return noted before and after administration of chemotherapy. Band aid applied. Patient left in satisfactory condition with VSS and no s/s of distress noted.  ?

## 2021-05-07 NOTE — Patient Instructions (Signed)
[facility] at [facility] ?Discharge Instructions ? ?You were seen and examined today by Dr. Delton Coombes. ? ?Dr. Delton Coombes discussed your most recent lab work and CT scan which revealed improvement of the cancer. ? ?Proceed with treatment today. ? ?Follow-up as scheduled. ? ? ?Thank you for choosing [facility] at [facility] to provide your oncology and hematology care.  To afford each patient quality time with our provider, please arrive at least 15 minutes before your scheduled appointment time.  ? ?If you have a lab appointment with the [facility] please come in thru the Main Entrance and check in at the main information desk. ? ?You need to re-schedule your appointment should you arrive 10 or more minutes late.  We strive to give you quality time with our providers, and arriving late affects you and other patients whose appointments are after yours.  Also, if you no show three or more times for appointments you may be dismissed from the clinic at the providers discretion.     ?Again, thank you for choosing [facility].  Our hope is that these requests will decrease the amount of time that you wait before being seen by our physicians.       ?_____________________________________________________________ ? ?Should you have questions after your visit to [facility], please contact our office at [phone] and follow the prompts.  Our office hours are 8:00 a.m. and 4:30 p.m. Monday - Friday.  Please note that voicemails left after 4:00 p.m. may not be returned until the following business day.  We are closed weekends and major holidays.  You do have access to a nurse 24-7, just call the main number to the clinic [phone] and do not press any options, hold on the line and a nurse will answer the phone.   ? ?For prescription refill requests, have your pharmacy contact our office and allow 72 hours.   ? ?Due to Covid, you will need to  wear a mask upon entering the hospital. If you do not have a mask, a mask will be given to you at the Main Entrance upon arrival. For doctor visits, patients may have 1 support person age 78 or older with them. For treatment visits, patients can not have anyone with them due to social distancing guidelines and our immunocompromised population.  ? ? ? ?

## 2021-05-07 NOTE — Progress Notes (Signed)
Patient has been assessed, vital signs and labs have been reviewed by Dr. Delton Coombes. ANC, Creatinine, LFTs, and Platelets are within treatment parameters per Dr. Delton Coombes. The patient is good to proceed with treatment at this time. Patient to receive 2g IV Mag today during visit per Dr. Delton Coombes Primary RN and pharmacy aware. ? ?

## 2021-05-08 ENCOUNTER — Encounter (HOSPITAL_COMMUNITY): Payer: Self-pay

## 2021-05-09 ENCOUNTER — Other Ambulatory Visit (HOSPITAL_COMMUNITY): Payer: Self-pay | Admitting: Surgery

## 2021-05-09 DIAGNOSIS — K5903 Drug induced constipation: Secondary | ICD-10-CM

## 2021-05-09 DIAGNOSIS — C782 Secondary malignant neoplasm of pleura: Secondary | ICD-10-CM

## 2021-05-09 DIAGNOSIS — C563 Malignant neoplasm of bilateral ovaries: Secondary | ICD-10-CM

## 2021-05-09 MED ORDER — LINACLOTIDE 290 MCG PO CAPS: 290.0000 ug | ORAL_CAPSULE | Freq: Every day | ORAL | 6 refills | Status: DC

## 2021-05-09 NOTE — Progress Notes (Signed)
Notified Dr. Delton Coombes that the PA was not approved yet for the pt's Linzess 145 mcg capsules (take 2 daily).  [facility] was requesting that the prescription be changed to the 290 mcg capsules.  Per Dr. Delton Coombes, prescription for Linzess 290 mcg capsules 1 daily was sent to [facility]. ?

## 2021-05-17 ENCOUNTER — Encounter (HOSPITAL_COMMUNITY): Payer: Self-pay

## 2021-05-28 ENCOUNTER — Other Ambulatory Visit (HOSPITAL_COMMUNITY): Payer: Self-pay

## 2021-05-28 ENCOUNTER — Inpatient Hospital Stay (HOSPITAL_COMMUNITY): Payer: Medicare Other

## 2021-05-28 ENCOUNTER — Inpatient Hospital Stay (HOSPITAL_COMMUNITY): Payer: Medicare Other | Attending: Hematology | Admitting: Hematology

## 2021-05-28 VITALS — BP 101/63 | HR 89 | Temp 97.8°F | Resp 18

## 2021-05-28 DIAGNOSIS — G629 Polyneuropathy, unspecified: Secondary | ICD-10-CM | POA: Insufficient documentation

## 2021-05-28 DIAGNOSIS — Z9079 Acquired absence of other genital organ(s): Secondary | ICD-10-CM | POA: Diagnosis not present

## 2021-05-28 DIAGNOSIS — C563 Malignant neoplasm of bilateral ovaries: Secondary | ICD-10-CM | POA: Insufficient documentation

## 2021-05-28 DIAGNOSIS — K5900 Constipation, unspecified: Secondary | ICD-10-CM | POA: Diagnosis not present

## 2021-05-28 DIAGNOSIS — Z79899 Other long term (current) drug therapy: Secondary | ICD-10-CM | POA: Diagnosis not present

## 2021-05-28 DIAGNOSIS — R1012 Left upper quadrant pain: Secondary | ICD-10-CM | POA: Diagnosis not present

## 2021-05-28 DIAGNOSIS — Z90710 Acquired absence of both cervix and uterus: Secondary | ICD-10-CM | POA: Diagnosis not present

## 2021-05-28 DIAGNOSIS — Z5111 Encounter for antineoplastic chemotherapy: Secondary | ICD-10-CM | POA: Insufficient documentation

## 2021-05-28 DIAGNOSIS — E611 Iron deficiency: Secondary | ICD-10-CM | POA: Diagnosis present

## 2021-05-28 DIAGNOSIS — J910 Malignant pleural effusion: Secondary | ICD-10-CM | POA: Insufficient documentation

## 2021-05-28 DIAGNOSIS — Z90722 Acquired absence of ovaries, bilateral: Secondary | ICD-10-CM | POA: Diagnosis not present

## 2021-05-28 DIAGNOSIS — Z87891 Personal history of nicotine dependence: Secondary | ICD-10-CM | POA: Insufficient documentation

## 2021-05-28 DIAGNOSIS — Z95828 Presence of other vascular implants and grafts: Secondary | ICD-10-CM

## 2021-05-28 LAB — CBC WITH DIFFERENTIAL/PLATELET
Abs Immature Granulocytes: 0.01 10*3/uL (ref 0.00–0.07)
Basophils Absolute: 0 10*3/uL (ref 0.0–0.1)
Basophils Relative: 1 %
Eosinophils Absolute: 0 10*3/uL (ref 0.0–0.5)
Eosinophils Relative: 1 %
HCT: 32.3 % — ABNORMAL LOW (ref 36.0–46.0)
Hemoglobin: 11 g/dL — ABNORMAL LOW (ref 12.0–15.0)
Immature Granulocytes: 0 %
Lymphocytes Relative: 35 %
Lymphs Abs: 1.3 10*3/uL (ref 0.7–4.0)
MCH: 39.7 pg — ABNORMAL HIGH (ref 26.0–34.0)
MCHC: 34.1 g/dL (ref 30.0–36.0)
MCV: 116.6 fL — ABNORMAL HIGH (ref 80.0–100.0)
Monocytes Absolute: 0.5 10*3/uL (ref 0.1–1.0)
Monocytes Relative: 13 %
Neutro Abs: 1.9 10*3/uL (ref 1.7–7.7)
Neutrophils Relative %: 50 %
Platelets: 155 10*3/uL (ref 150–400)
RBC: 2.77 MIL/uL — ABNORMAL LOW (ref 3.87–5.11)
RDW: 17.7 % — ABNORMAL HIGH (ref 11.5–15.5)
WBC: 3.7 10*3/uL — ABNORMAL LOW (ref 4.0–10.5)
nRBC: 0 % (ref 0.0–0.2)

## 2021-05-28 LAB — COMPREHENSIVE METABOLIC PANEL
ALT: 22 U/L (ref 0–44)
AST: 23 U/L (ref 15–41)
Albumin: 3.8 g/dL (ref 3.5–5.0)
Alkaline Phosphatase: 123 U/L (ref 38–126)
Anion gap: 5 (ref 5–15)
BUN: 14 mg/dL (ref 6–20)
CO2: 27 mmol/L (ref 22–32)
Calcium: 9.1 mg/dL (ref 8.9–10.3)
Chloride: 106 mmol/L (ref 98–111)
Creatinine, Ser: 0.53 mg/dL (ref 0.44–1.00)
GFR, Estimated: >60 mL/min (ref >60)
Glucose, Bld: 112 mg/dL — ABNORMAL HIGH (ref 70–99)
Potassium: 3.7 mmol/L (ref 3.5–5.1)
Sodium: 138 mmol/L (ref 135–145)
Total Bilirubin: 0.5 mg/dL (ref 0.3–1.2)
Total Protein: 6.9 g/dL (ref 6.5–8.1)

## 2021-05-28 LAB — MAGNESIUM: Magnesium: 1.8 mg/dL (ref 1.7–2.4)

## 2021-05-28 MED ORDER — FAMOTIDINE IN NACL 20-0.9 MG/50ML-% IV SOLN
20.0000 mg | Freq: Once | INTRAVENOUS | Status: AC
  Administered 2021-05-28: 20 mg via INTRAVENOUS
  Filled 2021-05-28: qty 50

## 2021-05-28 MED ORDER — SODIUM CHLORIDE 0.9 % IV SOLN
661.0000 mg | Freq: Once | INTRAVENOUS | Status: AC
  Administered 2021-05-28: 660 mg via INTRAVENOUS
  Filled 2021-05-28: qty 66

## 2021-05-28 MED ORDER — SODIUM CHLORIDE 0.9% FLUSH
10.0000 mL | INTRAVENOUS | Status: DC | PRN
  Administered 2021-05-28: 10 mL

## 2021-05-28 MED ORDER — HEPARIN SOD (PORK) LOCK FLUSH 100 UNIT/ML IV SOLN
500.0000 [IU] | Freq: Once | INTRAVENOUS | Status: AC | PRN
  Administered 2021-05-28: 500 [IU]

## 2021-05-28 MED ORDER — SODIUM CHLORIDE 0.9 % IV SOLN: Freq: Once | INTRAVENOUS | Status: AC

## 2021-05-28 MED ORDER — LORAZEPAM 2 MG/ML IJ SOLN
0.5000 mg | Freq: Once | INTRAMUSCULAR | Status: AC
  Administered 2021-05-28: 0.5 mg via INTRAVENOUS
  Filled 2021-05-28: qty 1

## 2021-05-28 MED ORDER — PALONOSETRON HCL INJECTION 0.25 MG/5ML
0.2500 mg | Freq: Once | INTRAVENOUS | Status: AC
  Administered 2021-05-28: 0.25 mg via INTRAVENOUS
  Filled 2021-05-28: qty 5

## 2021-05-28 MED ORDER — OXYCODONE HCL 5 MG PO TABS
20.0000 mg | ORAL_TABLET | Freq: Once | ORAL | Status: AC
  Administered 2021-05-28: 20 mg via ORAL
  Filled 2021-05-28: qty 4

## 2021-05-28 MED ORDER — SODIUM CHLORIDE 0.9 % IV SOLN
10.0000 mg | Freq: Once | INTRAVENOUS | Status: AC
  Administered 2021-05-28: 10 mg via INTRAVENOUS
  Filled 2021-05-28: qty 1

## 2021-05-28 MED ORDER — OXYCODONE HCL 20 MG PO TABS: 1.0000 | ORAL_TABLET | ORAL | 0 refills | Status: DC | PRN

## 2021-05-28 MED ORDER — SODIUM CHLORIDE 0.9 % IV SOLN
150.0000 mg | Freq: Once | INTRAVENOUS | Status: AC
  Administered 2021-05-28: 150 mg via INTRAVENOUS
  Filled 2021-05-28: qty 5

## 2021-05-28 MED ORDER — DIPHENHYDRAMINE HCL 50 MG/ML IJ SOLN
50.0000 mg | Freq: Once | INTRAMUSCULAR | Status: AC
  Administered 2021-05-28: 50 mg via INTRAVENOUS
  Filled 2021-05-28: qty 1

## 2021-05-28 MED ORDER — SODIUM CHLORIDE 0.9 % IV SOLN
175.0000 mg/m2 | Freq: Once | INTRAVENOUS | Status: AC
  Administered 2021-05-28: 336 mg via INTRAVENOUS
  Filled 2021-05-28: qty 56

## 2021-05-28 NOTE — Patient Instructions (Signed)
[facility]  Discharge Instructions: ?Thank you for choosing [facility] to provide your oncology and hematology care.  ?If you have a lab appointment with the Cancer Center, please come in thru the Main Entrance and check in at the main information desk. ? ?Wear comfortable clothing and clothing appropriate for easy access to any Portacath or PICC line.  ? ?We strive to give you quality time with your provider. You may need to reschedule your appointment if you arrive late (15 or more minutes).  Arriving late affects you and other patients whose appointments are after yours.  Also, if you miss three or more appointments without notifying the office, you may be dismissed from the clinic at the provider?s discretion.    ?  ?For prescription refill requests, have your pharmacy contact our office and allow 72 hours for refills to be completed.   ? ?Today you received the following chemotherapy and/or immunotherapy agents Taxol/Carboplatin, return as scheduled. ?  ?To help prevent nausea and vomiting after your treatment, we encourage you to take your nausea medication as directed. ? ?BELOW ARE SYMPTOMS THAT SHOULD BE REPORTED IMMEDIATELY: ?*FEVER GREATER THAN 100.4 F (38 ?C) OR HIGHER ?*CHILLS OR SWEATING ?*NAUSEA AND VOMITING THAT IS NOT CONTROLLED WITH YOUR NAUSEA MEDICATION ?*UNUSUAL SHORTNESS OF BREATH ?*UNUSUAL BRUISING OR BLEEDING ?*URINARY PROBLEMS (pain or burning when urinating, or frequent urination) ?*BOWEL PROBLEMS (unusual diarrhea, constipation, pain near the anus) ?TENDERNESS IN MOUTH AND THROAT WITH OR WITHOUT PRESENCE OF ULCERS (sore throat, sores in mouth, or a toothache) ?UNUSUAL RASH, SWELLING OR PAIN  ?UNUSUAL VAGINAL DISCHARGE OR ITCHING  ? ?Items with * indicate a potential emergency and should be followed up as soon as possible or go to the Emergency Department if any problems should occur. ? ?Please show the CHEMOTHERAPY ALERT CARD or IMMUNOTHERAPY ALERT CARD at  check-in to the Emergency Department and triage nurse. ? ?Should you have questions after your visit or need to cancel or reschedule your appointment, please contact [facility] [phone]  and follow the prompts.  Office hours are 8:00 a.m. to 4:30 p.m. Monday - Friday. Please note that voicemails left after 4:00 p.m. may not be returned until the following business day.  We are closed weekends and major holidays. You have access to a nurse at all times for urgent questions. Please call the main number to the clinic [phone] and follow the prompts. ? ?For any non-urgent questions, you may also contact your provider using MyChart. We now offer e-Visits for anyone 18 and older to request care online for non-urgent symptoms. For details visit [email or web address]. ?  ?Also download the MyChart app! Go to the app store, search "MyChart", open the app, select [facility], and log in with your MyChart username and password. ? ?Due to Covid, a mask is required upon entering the hospital/clinic. If you do not have a mask, one will be given to you upon arrival. For doctor visits, patients may have 1 support person aged 18 or older with them. For treatment visits, patients cannot have anyone with them due to current Covid guidelines and our immunocompromised population.  ?

## 2021-05-28 NOTE — Progress Notes (Signed)
Patient has been examined by Dr. Katragadda, and vital signs and labs have been reviewed. ANC, Creatinine, LFTs, hemoglobin, and platelets are within treatment parameters per M.D. - pt may proceed with treatment.    °

## 2021-05-28 NOTE — Progress Notes (Signed)
? ?[facility] ?[address] ?[place], [place] [address] ? ? ?CLINIC:  ?Medical Oncology/Hematology ? ?PCP:  ?[name], MD ?[place] / [place] [address] ?[phone] ? ? ?REASON FOR VISIT:  ?Follow-up for high-grade serous ovarian cancer ? ?PRIOR THERAPY:  ?1. Carboplatin and paclitaxel x 7 cycles from 08/24/2018 to 03/15/2019. ?2. Laparoscopic TAH & BSO & omenectomy on 12/24/2018. ? ?NGS Results: not done ? ?CURRENT THERAPY: Carboplatin (AUC 6) / Paclitaxel (175) q21d x 6 cycles ? ?BRIEF ONCOLOGIC HISTORY:  ?Oncology History  ?Ovarian cancer, bilateral ([place])  ?07/07/2018 Pathology Results  ? PLEURAL FLUID, LEFT (SPECIMEN 1 OF 1, COLLECTED 07/07/18): ?- MALIGNANT CELLS CONSISTENT WITH ADENOCARCINOMA ?- SEE COMMENT ? ?Source ?Pleural Fluid, (Specimen 1 of 1, collected on 07/07/2018) ?Gross ?Specimen: Received is/are 1000cc of bloody red fluid with tissue. (TC:tc) ?Prepared: ?# Smears: 0 ?# Concentration Technique Slides (i.e. ThinPrep): 1 ?# Cell Block: 1 Conventional ?Additional Studies: Two Hematology slides labeled [id number] ?Comment ?The malignant cells are positive for cytokeratin 7, p53, WT-1, Pax-8, Moc31, ER (weak) and EMA but negative for cytokeratin 20, TTF-1, GATA-3, CDX-2 and D2-40. Overall, the phenotype is consistent with a gynecologic primary; clinical correlation recommended. ?  ?07/07/2018 Procedure  ? Successful ultrasound guided left thoracentesis yielding 2.0 L of pleural fluid ?  ?07/08/2018 Procedure  ? 1. Technically successful placement of left 14 French pigtail chest drain, placed to Pleur-evac water-seal. ?  ?07/08/2018 Procedure  ? 1. Technically successful five [place] double lumen power injectable PICC placement ?  ?07/09/2018 Imaging  ? Ct chest ?1. There is a moderate, loculated left hydropneumothorax with a small air component and moderate fluid component. The largest loculated component is located posteriorly. There is a pigtail drainage catheter about the lateral pleural  space. There is no obvious etiology, such as obvious mass or pleural disease. ?  ?2. There is a small right pleural effusion with associated atelectasis or consolidation and a subpleural consolidation of the superior segment right lower lobe (series 4, image 56), of uncertain significance, possibly infectious or inflammatory ?  ?07/10/2018 Imaging  ? Ct abdomen and pelvis: ?1. The bilateral ovaries are enlarged by heterogeneous appearing cystic lesions, measuring 5.3 x 4.2 cm on the right (series 4, image 72) and 4.5 x 3.2 cm on the left (series 4, image 75). Consider dedicated pelvic ultrasound and/or pelvic MRI to further evaluate for solid components given high suspicion for GYN primary malignancy. ?  ?2. No other evidence of mass and no lymphadenopathy in the abdomen or pelvis. ?  ?3. Trace ascites. There is some suggestion of omental and peritoneal nodularity (e.g. Series 4, image 49), concerning for peritoneal metastatic disease. ?   ?4. Loculated left-sided pleural effusion with left-sided pleural drainage catheter in position. Small right pleural effusion ?  ?07/13/2018 Surgery  ? OPERATION: ?1.  Left VATS (video-assisted thoracoscopic surgery) for drainage of loculated pleural effusion. ?2.  Talc pleurodesis for malignant pleural effusion. ?3.  Placement of PleurX catheter for management of malignant pleural effusion. ?4.  Placement of On-Q analgesia catheter system. ?   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 ?  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 ?  ?07/13/2018 Pathology Results  ? Pleura, peel, Left Pleural ?- FIBRO-FIBRINOUS PLEURITIS ?- NEGATIVE FOR MALIGNANCY ?  ?07/18/2018 Initial Diagnosis  ? Ovarian cancer, bilateral ([place]) ?  ?07/20/2018 Procedure  ? EGD  impression: ?Normal proximal esophagus and mid esophagus. ?Mild distal esophageal rings; dilation  not performed because of esophagitis. ?LA Grade C reflux esophagitis. ?Z-line regular, 30 cm from the incisors. ?5 cm hiatal hernia. ?Non-bleeding gastric ulcer with no stigmata of bleeding. ?Gastritis. ?Duodenal erosions without bleeding. ?Normal second portion of the duodenum. ?No specimens collected. ?  ?08/19/2018 Genetic Testing  ? Negative genetic testing on the common hereditary cancer panel.  The Common Hereditary Gene Panel offered by [facility] includes sequencing and/or deletion duplication testing of the following 48 genes: APC, ATM, AXIN2, BARD1, BMPR1A, BRCA1, BRCA2, BRIP1, CDH1, CDK4, CDKN2A (p14ARF), CDKN2A (p16INK4a), CHEK2, CTNNA1, DICER1, EPCAM (Deletion/duplication testing only), GREM1 (promoter region deletion/duplication testing only), KIT, MEN1, MLH1, MSH2, MSH3, MSH6, MUTYH, NBN, NF1, NHTL1, PALB2, PDGFRA, PMS2, POLD1, POLE, PTEN, RAD50, RAD51C, RAD51D, RNF43, SDHB, SDHC, SDHD, SMAD4, SMARCA4. STK11, TP53, TSC1, TSC2, and VHL.  The following genes were evaluated for sequence changes only: SDHA and HOXB13 c.251G>A variant only. The report date is August 19, 2018. ?  ?08/24/2018 - 03/15/2019 Chemotherapy  ? Patient is on Treatment Plan : OVARIAN Carboplatin (AUC 6) / Paclitaxel (175) q21d x 6 cycles  ? ?   ?03/05/2021 -  Chemotherapy  ? Patient is on Treatment Plan : OVARIAN Carboplatin (AUC 6) / Paclitaxel (175) q21d x 6 cycles  ? ?   ? ? ?CANCER STAGING: ? Cancer Staging  ?No matching staging information was found for the patient. ? ?INTERVAL HISTORY:  ?Michelle Holt, a 51 y.o. female, returns for routine follow-up and consideration for next cycle of chemotherapy. [name] was last seen on 05/07/2021. ? ?Due for cycle #5 of Carboplatin and Taxol today.  ? ?Overall, she tells me she has been feeling pretty well. She reports bone pains from her knees to her shins occurring during the day starting following her last treatment. She denies n/v/d/c. Her appetite is good. She is taking 500 mg Potassium BID.  She denies fevers and infections in the past 3 weeks.  ? ?Overall, she feels ready for next cycle of chemo today.  ? ? ?REVIEW OF SYSTEMS:  ?Review of Systems  ?Constitutional:  Negative for appetite change, fatigue and fever.  ?HENT:   Positive for trouble swallowing.   ?Respiratory:  Positive for cough and shortness of breath.   ?Gastrointestinal:  Negative for constipation, diarrhea, nausea and vomiting.  ?Musculoskeletal:  Positive for arthralgias (7/10).  ?Neurological:  Positive for headaches and numbness.  ?Psychiatric/Behavioral:  Positive for depression. The patient is nervous/anxious.   ?All other systems reviewed and are negative. ? ?PAST MEDICAL/SURGICAL HISTORY:  ?Past Medical History:  ?Diagnosis Date  ? Anemia   ? Anxiety and depression   ? Arthritis of facet joints at multiple vertebral levels   ? L5-S1  ? Constipation   ? Dyslipidemia   ? Family history of breast cancer   ? Family history of uterine cancer   ? GERD (gastroesophageal reflux disease)   ? History of hiatal hernia   ? History of kidney stones   ? Insomnia   ? Irritable bowel syndrome   ? Migraine   ? Muscle tension headache   ? Neuropathy of finger   ? Ovarian carcinoma ([place])   ? ovarian  ? Plantar fasciitis of right foot   ? Port-A-Cath in place 08/20/2018  ? ?Past Surgical History:  ?Procedure Laterality Date  ? ABDOMINAL HYSTERECTOMY    ? BIOPSY  10/27/2020  ? Procedure: BIOPSY;  Surgeon: Harvel Quale, MD;  [facility];  Service: Gastroenterology;;  ? CHOLECYSTECTOMY  2008  ? COLONOSCOPY N/A 08/13/2013  ? Procedure: COLONOSCOPY;  Surgeon: Rogene Houston, MD;  [facility];  Service: Endoscopy;  Laterality: N/A;  230-moved to 145 Ann to notify pt  ? COLONOSCOPY WITH PROPOFOL N/A 11/15/2020  ? Procedure: COLONOSCOPY WITH PROPOFOL;  Surgeon: Rogene Houston, MD;  [facility];  Service: Endoscopy;  Laterality: N/A;  1:40  ? ESOPHAGEAL DILATION N/A 02/28/2021  ? Procedure: ESOPHAGEAL  DILATION;  Surgeon: Rogene Houston, MD;  [facility];  Service: Endoscopy;  Laterality: N/A;  ? ESOPHAGOGASTRODUODENOSCOPY    ? ESOPHAGOGASTRODUODENOSCOPY (EGD) WITH PROPOFOL N/A 07/20/2018  ? Procedu

## 2021-05-28 NOTE — Progress Notes (Signed)
Orders received from Dr. Ludwig Lean to give patient 20 mg of Oxycodone PO x 1 dose now for break through pain.  ?

## 2021-05-28 NOTE — Patient Instructions (Signed)
[facility] ?Discharge Instructions ? ? ?You were seen and examined today by Dr. Katragadda. ? ?He reviewed your lab work which is normal/stable. ? ?We will proceed with your treatment today. ? ?Return as scheduled in 3 weeks.  ? ? ?Thank you for choosing [facility] to provide your oncology and hematology care.  To afford each patient quality time with our provider, please arrive at least 15 minutes before your scheduled appointment time.  ? ?If you have a lab appointment with the Cancer Center please come in thru the Main Entrance and check in at the main information desk. ? ?You need to re-schedule your appointment should you arrive 10 or more minutes late.  We strive to give you quality time with our providers, and arriving late affects you and other patients whose appointments are after yours.  Also, if you no show three or more times for appointments you may be dismissed from the clinic at the providers discretion.     ?Again, thank you for choosing [facility].  Our hope is that these requests will decrease the amount of time that you wait before being seen by our physicians.       ?_____________________________________________________________ ? ?Should you have questions after your visit to [facility], please contact our office at [phone] and follow the prompts.  Our office hours are 8:00 a.m. and 4:30 p.m. Monday - Friday.  Please note that voicemails left after 4:00 p.m. may not be returned until the following business day.  We are closed weekends and major holidays.  You do have access to a nurse 24-7, just call the main number to the clinic [phone] and do not press any options, hold on the line and a nurse will answer the phone.   ? ?For prescription refill requests, have your pharmacy contact our office and allow 72 hours.   ? ?Due to Covid, you will need to wear a mask upon entering the  hospital. If you do not have a mask, a mask will be given to you at the Main Entrance upon arrival. For doctor visits, patients may have 1 support person age 18 or older with them. For treatment visits, patients can not have anyone with them due to social distancing guidelines and our immunocompromised population.  ? ?   ?

## 2021-05-28 NOTE — Progress Notes (Signed)
Patient tolerated chemotherapy with no complaints voiced. Side effects with management reviewed understanding verbalized. Port site clean and dry with no bruising or swelling noted at site. Good blood return noted before and after administration of chemotherapy. Band aid applied. Patient left in satisfactory condition with VSS and no s/s of distress noted. 

## 2021-05-29 ENCOUNTER — Other Ambulatory Visit (HOSPITAL_COMMUNITY): Payer: Self-pay | Admitting: *Deleted

## 2021-05-29 ENCOUNTER — Ambulatory Visit: Payer: Medicare Other | Admitting: Urology

## 2021-05-29 ENCOUNTER — Encounter (HOSPITAL_COMMUNITY): Payer: Self-pay

## 2021-05-29 LAB — CA 125: Cancer Antigen (CA) 125: 24.5 U/mL (ref 0.0–38.1)

## 2021-05-29 MED ORDER — OXYCODONE HCL 30 MG PO TABA: 1.0000 | ORAL_TABLET | Freq: Four times a day (QID) | ORAL | 0 refills | Status: DC | PRN

## 2021-05-29 NOTE — Progress Notes (Deleted)
Assessment: 1. Urge incontinence      Plan: ***  Chief Complaint: No chief complaint on file.   History of Present Illness:  Michelle Holt is a 51 y.o. year old female who is seen in consultation from Christella Scheuermann, MD for evaluation of urinary incontinence.   Past Medical History:  Past Medical History:  Diagnosis Date   Anemia    Anxiety and depression    Arthritis of facet joints at multiple vertebral levels    L5-S1   Constipation    Dyslipidemia    Family history of breast cancer    Family history of uterine cancer    GERD (gastroesophageal reflux disease)    History of hiatal hernia    History of kidney stones    Insomnia    Irritable bowel syndrome    Migraine    Muscle tension headache    Neuropathy of finger    Ovarian carcinoma ([facility])    ovarian   Plantar fasciitis of right foot    Port-A-Cath in place 08/20/2018    Past Surgical History:  Past Surgical History:  Procedure Laterality Date   ABDOMINAL HYSTERECTOMY     BIOPSY  10/27/2020   Procedure: BIOPSY;  Surgeon: Harvel Quale, MD;  [facility];  Service: Gastroenterology;;   CHOLECYSTECTOMY  2008   COLONOSCOPY N/A 08/13/2013   Procedure: COLONOSCOPY;  Surgeon: Rogene Houston, MD;  [facility];  Service: Endoscopy;  Laterality: N/A;  230-moved to 145 Ann to notify pt   COLONOSCOPY WITH PROPOFOL N/A 11/15/2020   Procedure: COLONOSCOPY WITH PROPOFOL;  Surgeon: Rogene Houston, MD;  [facility];  Service: Endoscopy;  Laterality: N/A;  1:40   ESOPHAGEAL DILATION N/A 02/28/2021   Procedure: ESOPHAGEAL DILATION;  Surgeon: Rogene Houston, MD;  [facility];  Service: Endoscopy;  Laterality: N/A;   ESOPHAGOGASTRODUODENOSCOPY     ESOPHAGOGASTRODUODENOSCOPY (EGD) WITH PROPOFOL N/A 07/20/2018   Procedure: ESOPHAGOGASTRODUODENOSCOPY (EGD) WITH PROPOFOL;  Surgeon: Rogene Houston, MD;  [facility];  Service: Endoscopy;   Laterality: N/A;  Possible esophageal dilation.   ESOPHAGOGASTRODUODENOSCOPY (EGD) WITH PROPOFOL N/A 10/27/2020   Procedure: ESOPHAGOGASTRODUODENOSCOPY (EGD) WITH PROPOFOL;  Surgeon: Harvel Quale, MD;  [facility];  Service: Gastroenterology;  Laterality: N/A;  2:10, pt knows to arrive at 10:15   ESOPHAGOGASTRODUODENOSCOPY (EGD) WITH PROPOFOL N/A 02/28/2021   Procedure: ESOPHAGOGASTRODUODENOSCOPY (EGD) WITH PROPOFOL;  Surgeon: Rogene Houston, MD;  [facility];  Service: Endoscopy;  Laterality: N/A;  200 ASA 1   GIVENS CAPSULE STUDY N/A 11/24/2020   Procedure: GIVENS CAPSULE STUDY;  Surgeon: Rogene Houston, MD;  [facility];  Service: Endoscopy;  Laterality: N/A;  7:30   IR ANGIOGRAM SELECTIVE EACH ADDITIONAL VESSEL  08/01/2018   IR ANGIOGRAM VISCERAL SELECTIVE  08/01/2018   IR EMBO ART  VEN HEMORR LYMPH EXTRAV  INC GUIDE ROADMAPPING  08/01/2018   IR IMAGING GUIDED PORT INSERTION  08/20/2018   IR PERC PLEURAL DRAIN W/INDWELL CATH W/IMG GUIDE  07/08/2018   IR THORACENTESIS ASP PLEURAL SPACE W/IMG GUIDE  07/07/2018   IR US GUIDE VASC ACCESS RIGHT  08/01/2018   PLEURAL EFFUSION DRAINAGE Left 07/13/2018   Procedure: DRAINAGE OF LOCULATED PLEURAL EFFUSION;  Surgeon: Ivin Poot, MD;  Location: [place];  Service: Thoracic;  Laterality: Left;   POLYPECTOMY  11/15/2020   Procedure: POLYPECTOMY;  Surgeon: Rogene Houston, MD;  [facility];  Service: Endoscopy;;   REMOVAL OF PLEURAL DRAINAGE CATHETER Left 08/20/2018   Procedure: REMOVAL OF PLEURAL DRAINAGE CATHETER;  Surgeon: Ivin Poot, MD;  Location: [place] [facility];  Service: Thoracic;  Laterality: Left;   REMOVAL OF PLEURAL DRAINAGE CATHETER Left 08/20/2018   Procedure: REMOVAL OF PLEURAL DRAINAGE CATHETER;  Surgeon: Ivin Poot, MD;  Location: [place];  Service: Thoracic;  Laterality: Left;   TALC PLEURODESIS Left 07/13/2018   Procedure: Talc Pleuradesis;  Surgeon: Prescott Gum, Collier Salina, MD;  Location:  [facility];  Service: Thoracic;  Laterality: Left;   TOTAL HIP ARTHROPLASTY Left 06/05/2020   Procedure: LEFT TOTAL HIP ARTHROPLASTY ANTERIOR APPROACH;  Surgeon: Leandrew Koyanagi, MD;  Location: [facility];  Service: Orthopedics;  Laterality: Left;  3-C   TUBAL LIGATION Bilateral    UTERINE ABLATION     VIDEO ASSISTED THORACOSCOPY Left 07/13/2018   Procedure: VIDEO ASSISTED THORACOSCOPY;  Surgeon: Ivin Poot, MD;  Location: [place];  Service: Thoracic;  Laterality: Left;    Allergies:  Allergies  Allergen Reactions   Morphine And Related Itching   Nickel Itching   Nortriptyline Other (See Comments)    Significant weight gain   Topamax [Topiramate] Diarrhea and Nausea Only   Xanax [Alprazolam]     Can't wake up    Actifed Cold-Allergy [Chlorpheniramine-Phenyleph Er] Rash   Amoxicillin Rash    Did it involve swelling of the face/tongue/throat, SOB, or low BP? Unknown Did it involve sudden or severe rash/hives, skin peeling, or any reaction on the inside of your mouth or nose? Unknown Did you need to seek medical attention at a hospital or doctor's office? Unknown When did it last happen? teenager       If all above answers are "NO", may proceed with cephalosporin use.    Codeine Hives   Erythromycin Rash   Penicillins Rash    Did it involve swelling of the face/tongue/throat, SOB, or low BP? Unknown Did it involve sudden or severe rash/hives, skin peeling, or any reaction on the inside of your mouth or nose? Unknown Did you need to seek medical attention at a hospital or doctor's office? Unknown When did it last happen? teenager       If all above answers are "NO", may proceed with cephalosporin use.    Sudafed [Pseudoephedrine Hcl] Rash    Family History:  Family History  Problem Relation Age of Onset   Hypertension Mother    Obesity Mother    Diabetes Mother    Kidney disease Mother    Peripheral vascular disease Father    Atrial fibrillation Father    COPD Brother     Osteoporosis Brother    Crohn's disease Sister    Uterine cancer Sister 13       maternal half sister   Breast cancer Maternal Aunt 93   Colon cancer Neg Hx     Social History:  Social History   Tobacco Use   Smoking status: Former    Packs/day: 0.50    Years: 17.00    Pack years: 8.50    Types: Cigarettes    Quit date: 06/22/2018    Years since quitting: 2.9   Smokeless tobacco: Never  Vaping Use   Vaping Use: Never used  Substance Use Topics   Alcohol use: Not Currently    Alcohol/week: 1.0 standard drink    Types: 1 Glasses of wine per week   Drug use: No    Review of symptoms:  Constitutional:  Negative  for unexplained weight loss, night sweats, fever, chills ENT:  Negative for nose bleeds, sinus pain, painful swallowing CV:  Negative for chest pain, shortness of breath, exercise intolerance, palpitations, loss of consciousness Resp:  Negative for cough, wheezing, shortness of breath GI:  Negative for nausea, vomiting, diarrhea, bloody stools GU:  Positives noted in HPI; otherwise negative for gross hematuria, dysuria Neuro:  Negative for seizures, poor balance, limb weakness, slurred speech Psych:  Negative for lack of energy, depression, anxiety Endocrine:  Negative for polydipsia, polyuria, symptoms of hypoglycemia (dizziness, hunger, sweating) Hematologic:  Negative for anemia, purpura, petechia, prolonged or excessive bleeding, use of anticoagulants  Allergic:  Negative for difficulty breathing or choking as a result of exposure to anything; no shellfish allergy; no allergic response (rash/itch) to materials, foods  Physical exam: LMP  (LMP Unknown)  GENERAL APPEARANCE:  Well appearing, well developed, well nourished, NAD HEENT: Atraumatic, Normocephalic, oropharynx clear. NECK: Supple without lymphadenopathy or thyromegaly. LUNGS: Clear to auscultation bilaterally. HEART: Regular Rate and Rhythm without murmurs, gallops, or rubs. ABDOMEN: Soft, non-tender, No  Masses. EXTREMITIES: Moves all extremities well.  Without clubbing, cyanosis, or edema. NEUROLOGIC:  Alert and oriented x 3, normal gait, CN II-XII grossly intact.  MENTAL STATUS:  Appropriate. BACK:  Non-tender to palpation.  No CVAT SKIN:  Warm, dry and intact.    Results: U/A:

## 2021-05-30 ENCOUNTER — Encounter (HOSPITAL_COMMUNITY): Payer: Self-pay

## 2021-05-30 ENCOUNTER — Other Ambulatory Visit (HOSPITAL_COMMUNITY): Payer: Self-pay | Admitting: *Deleted

## 2021-05-30 ENCOUNTER — Other Ambulatory Visit (HOSPITAL_COMMUNITY): Payer: Self-pay | Admitting: Hematology

## 2021-05-30 MED ORDER — MAGIC MOUTHWASH W/LIDOCAINE: 5.0000 mL | Freq: Three times a day (TID) | ORAL | 6 refills | Status: DC | PRN

## 2021-05-30 MED ORDER — MAGIC MOUTHWASH W/LIDOCAINE: 5.0000 mL | Freq: Three times a day (TID) | ORAL | 0 refills | Status: DC | PRN

## 2021-06-08 ENCOUNTER — Encounter (HOSPITAL_COMMUNITY): Payer: Self-pay

## 2021-06-18 ENCOUNTER — Inpatient Hospital Stay (HOSPITAL_COMMUNITY): Payer: Medicare Other

## 2021-06-18 ENCOUNTER — Inpatient Hospital Stay (HOSPITAL_COMMUNITY): Payer: Medicare Other | Attending: Hematology

## 2021-06-18 ENCOUNTER — Inpatient Hospital Stay (HOSPITAL_BASED_OUTPATIENT_CLINIC_OR_DEPARTMENT_OTHER): Payer: Medicare Other | Admitting: Hematology

## 2021-06-18 VITALS — BP 111/62 | HR 92 | Temp 98.4°F | Resp 18

## 2021-06-18 VITALS — BP 119/70 | HR 100 | Temp 97.9°F | Resp 18 | Ht 64.0 in | Wt 190.0 lb

## 2021-06-18 DIAGNOSIS — Z9079 Acquired absence of other genital organ(s): Secondary | ICD-10-CM | POA: Insufficient documentation

## 2021-06-18 DIAGNOSIS — R1012 Left upper quadrant pain: Secondary | ICD-10-CM | POA: Diagnosis not present

## 2021-06-18 DIAGNOSIS — Z90721 Acquired absence of ovaries, unilateral: Secondary | ICD-10-CM | POA: Insufficient documentation

## 2021-06-18 DIAGNOSIS — C563 Malignant neoplasm of bilateral ovaries: Secondary | ICD-10-CM

## 2021-06-18 DIAGNOSIS — Z90710 Acquired absence of both cervix and uterus: Secondary | ICD-10-CM | POA: Diagnosis not present

## 2021-06-18 DIAGNOSIS — E611 Iron deficiency: Secondary | ICD-10-CM | POA: Insufficient documentation

## 2021-06-18 DIAGNOSIS — K5900 Constipation, unspecified: Secondary | ICD-10-CM | POA: Diagnosis not present

## 2021-06-18 DIAGNOSIS — G629 Polyneuropathy, unspecified: Secondary | ICD-10-CM | POA: Diagnosis not present

## 2021-06-18 DIAGNOSIS — Z5111 Encounter for antineoplastic chemotherapy: Secondary | ICD-10-CM | POA: Diagnosis present

## 2021-06-18 DIAGNOSIS — Z87891 Personal history of nicotine dependence: Secondary | ICD-10-CM | POA: Insufficient documentation

## 2021-06-18 DIAGNOSIS — Z95828 Presence of other vascular implants and grafts: Secondary | ICD-10-CM

## 2021-06-18 LAB — CBC WITH DIFFERENTIAL/PLATELET
Abs Immature Granulocytes: 0.01 10*3/uL (ref 0.00–0.07)
Basophils Absolute: 0 10*3/uL (ref 0.0–0.1)
Basophils Relative: 1 %
Eosinophils Absolute: 0 10*3/uL (ref 0.0–0.5)
Eosinophils Relative: 1 %
HCT: 31 % — ABNORMAL LOW (ref 36.0–46.0)
Hemoglobin: 10.3 g/dL — ABNORMAL LOW (ref 12.0–15.0)
Immature Granulocytes: 0 %
Lymphocytes Relative: 29 %
Lymphs Abs: 1.2 10*3/uL (ref 0.7–4.0)
MCH: 40.7 pg — ABNORMAL HIGH (ref 26.0–34.0)
MCHC: 33.2 g/dL (ref 30.0–36.0)
MCV: 122.5 fL — ABNORMAL HIGH (ref 80.0–100.0)
Monocytes Absolute: 0.5 10*3/uL (ref 0.1–1.0)
Monocytes Relative: 13 %
Neutro Abs: 2.4 10*3/uL (ref 1.7–7.7)
Neutrophils Relative %: 56 %
Platelets: 125 10*3/uL — ABNORMAL LOW (ref 150–400)
RBC: 2.53 MIL/uL — ABNORMAL LOW (ref 3.87–5.11)
RDW: 16.9 % — ABNORMAL HIGH (ref 11.5–15.5)
WBC: 4.2 10*3/uL (ref 4.0–10.5)
nRBC: 0 % (ref 0.0–0.2)

## 2021-06-18 LAB — COMPREHENSIVE METABOLIC PANEL
ALT: 20 U/L (ref 0–44)
AST: 26 U/L (ref 15–41)
Albumin: 3.7 g/dL (ref 3.5–5.0)
Alkaline Phosphatase: 106 U/L (ref 38–126)
Anion gap: 5 (ref 5–15)
BUN: 10 mg/dL (ref 6–20)
CO2: 28 mmol/L (ref 22–32)
Calcium: 9 mg/dL (ref 8.9–10.3)
Chloride: 105 mmol/L (ref 98–111)
Creatinine, Ser: 0.63 mg/dL (ref 0.44–1.00)
GFR, Estimated: >60 mL/min (ref >60)
Glucose, Bld: 112 mg/dL — ABNORMAL HIGH (ref 70–99)
Potassium: 3.8 mmol/L (ref 3.5–5.1)
Sodium: 138 mmol/L (ref 135–145)
Total Bilirubin: 0.8 mg/dL (ref 0.3–1.2)
Total Protein: 6.8 g/dL (ref 6.5–8.1)

## 2021-06-18 LAB — IRON AND TIBC
Iron: 124 ug/dL (ref 28–170)
Saturation Ratios: 35 % — ABNORMAL HIGH (ref 10.4–31.8)
TIBC: 358 ug/dL (ref 250–450)
UIBC: 234 ug/dL

## 2021-06-18 LAB — FERRITIN: Ferritin: 226 ng/mL (ref 11–307)

## 2021-06-18 LAB — MAGNESIUM: Magnesium: 1.5 mg/dL — ABNORMAL LOW (ref 1.7–2.4)

## 2021-06-18 MED ORDER — LORAZEPAM 2 MG/ML IJ SOLN
0.5000 mg | Freq: Once | INTRAMUSCULAR | Status: AC
  Administered 2021-06-18: 0.5 mg via INTRAVENOUS

## 2021-06-18 MED ORDER — MAGNESIUM SULFATE 2 GM/50ML IV SOLN
2.0000 g | Freq: Once | INTRAVENOUS | Status: AC
  Administered 2021-06-18: 2 g via INTRAVENOUS

## 2021-06-18 MED ORDER — FAMOTIDINE IN NACL 20-0.9 MG/50ML-% IV SOLN
20.0000 mg | Freq: Once | INTRAVENOUS | Status: AC
  Administered 2021-06-18: 20 mg via INTRAVENOUS

## 2021-06-18 MED ORDER — SODIUM CHLORIDE 0.9 % IV SOLN
175.0000 mg/m2 | Freq: Once | INTRAVENOUS | Status: AC
  Administered 2021-06-18: 336 mg via INTRAVENOUS
  Filled 2021-06-18: qty 56

## 2021-06-18 MED ORDER — DIPHENHYDRAMINE HCL 50 MG/ML IJ SOLN
50.0000 mg | Freq: Once | INTRAMUSCULAR | Status: AC
  Administered 2021-06-18: 50 mg via INTRAVENOUS

## 2021-06-18 MED ORDER — LORAZEPAM 2 MG/ML IJ SOLN
INTRAMUSCULAR | Status: AC
  Filled 2021-06-18: qty 1

## 2021-06-18 MED ORDER — FAMOTIDINE IN NACL 20-0.9 MG/50ML-% IV SOLN
INTRAVENOUS | Status: AC
  Filled 2021-06-18: qty 50

## 2021-06-18 MED ORDER — ALTEPLASE 2 MG IJ SOLR
2.0000 mg | Freq: Once | INTRAMUSCULAR | Status: AC
  Administered 2021-06-18: 2 mg

## 2021-06-18 MED ORDER — DIPHENHYDRAMINE HCL 50 MG/ML IJ SOLN
INTRAMUSCULAR | Status: AC
  Filled 2021-06-18: qty 1

## 2021-06-18 MED ORDER — MAGNESIUM SULFATE 2 GM/50ML IV SOLN
INTRAVENOUS | Status: AC
  Filled 2021-06-18: qty 50

## 2021-06-18 MED ORDER — PALONOSETRON HCL INJECTION 0.25 MG/5ML
INTRAVENOUS | Status: AC
  Filled 2021-06-18: qty 5

## 2021-06-18 MED ORDER — SODIUM CHLORIDE 0.9% FLUSH
10.0000 mL | INTRAVENOUS | Status: DC | PRN
  Administered 2021-06-18: 10 mL

## 2021-06-18 MED ORDER — SODIUM CHLORIDE 0.9 % IV SOLN
661.0000 mg | Freq: Once | INTRAVENOUS | Status: AC
  Administered 2021-06-18: 660 mg via INTRAVENOUS
  Filled 2021-06-18: qty 66

## 2021-06-18 MED ORDER — HEPARIN SOD (PORK) LOCK FLUSH 100 UNIT/ML IV SOLN
500.0000 [IU] | Freq: Once | INTRAVENOUS | Status: AC | PRN
  Administered 2021-06-18: 500 [IU]

## 2021-06-18 MED ORDER — PALONOSETRON HCL INJECTION 0.25 MG/5ML
0.2500 mg | Freq: Once | INTRAVENOUS | Status: AC
  Administered 2021-06-18: 0.25 mg via INTRAVENOUS

## 2021-06-18 MED ORDER — SODIUM CHLORIDE 0.9 % IV SOLN: Freq: Once | INTRAVENOUS | Status: AC

## 2021-06-18 MED ORDER — SODIUM CHLORIDE 0.9 % IV SOLN
150.0000 mg | Freq: Once | INTRAVENOUS | Status: AC
  Administered 2021-06-18: 150 mg via INTRAVENOUS
  Filled 2021-06-18: qty 150

## 2021-06-18 MED ORDER — SODIUM CHLORIDE 0.9 % IV SOLN
10.0000 mg | Freq: Once | INTRAVENOUS | Status: AC
  Administered 2021-06-18: 10 mg via INTRAVENOUS
  Filled 2021-06-18: qty 10

## 2021-06-18 NOTE — Progress Notes (Signed)
Pt presents today for Taxol and Carboplatin per provider's order. Labs and vital signs WNL for treatment today. Okay to proceed with treatment today per Dr.K. Pt's magnesium 1.5 today. Message received from A.Anderson-RN pt will receive 2g IV magnesium sulfate.  Taxol, Carboplatin, and 2g IV magnesium sulfate given today per MD orders. Tolerated infusion without adverse affects. Vital signs stable. No complaints at this time. Discharged from clinic ambulatory in stable condition. Alert and oriented x 3. F/U with [facility] as scheduled.

## 2021-06-18 NOTE — Progress Notes (Signed)
Michelle Holt, [place] [address]   CLINIC:  Medical Oncology/Hematology  PCP:  [name], MD [address] / [place] [address] [phone]   REASON FOR VISIT:  Follow-up for high-grade serous ovarian cancer  PRIOR THERAPY:  1. Carboplatin and paclitaxel x 7 cycles from 08/24/2018 to 03/15/2019. 2. Laparoscopic TAH & BSO & omenectomy on 12/24/2018.  NGS Results: not done  CURRENT THERAPY: Carboplatin (AUC 6) / Paclitaxel (175) q21d x 6 cycles  BRIEF ONCOLOGIC HISTORY:  Oncology History  Ovarian cancer, bilateral ([facility])  07/07/2018 Pathology Results   PLEURAL FLUID, LEFT (SPECIMEN 1 OF 1, COLLECTED 07/07/18): - MALIGNANT CELLS CONSISTENT WITH ADENOCARCINOMA - SEE COMMENT  Source Pleural Fluid, (Specimen 1 of 1, collected on 07/07/2018) Gross Specimen: Received is/are 1000cc of bloody red fluid with tissue. (TC:tc) Prepared: # Smears: 0 # Concentration Technique Slides (i.e. ThinPrep): 1 # Cell Block: 1 Conventional Additional Studies: Two Hematology slides labeled [id number] Comment The malignant cells are positive for cytokeratin 7, p53, WT-1, Pax-8, Moc31, ER (weak) and EMA but negative for cytokeratin 20, TTF-1, GATA-3, CDX-2 and D2-40. Overall, the phenotype is consistent with a gynecologic primary; clinical correlation recommended.   07/07/2018 Procedure   Successful ultrasound guided left thoracentesis yielding 2.0 L of pleural fluid   07/08/2018 Procedure   1. Technically successful placement of left 14 French pigtail chest drain, placed to Pleur-evac water-seal.   07/08/2018 Procedure   1. Technically successful five [place] double lumen power injectable PICC placement   07/09/2018 Imaging   Ct chest 1. There is a moderate, loculated left hydropneumothorax with a small air component and moderate fluid component. The largest loculated component is located posteriorly. There is a pigtail drainage catheter about the lateral pleural  space. There is no obvious etiology, such as obvious mass or pleural disease.   2. There is a small right pleural effusion with associated atelectasis or consolidation and a subpleural consolidation of the superior segment right lower lobe (series 4, image 56), of uncertain significance, possibly infectious or inflammatory   07/10/2018 Imaging   Ct abdomen and pelvis: 1. The bilateral ovaries are enlarged by heterogeneous appearing cystic lesions, measuring 5.3 x 4.2 cm on the right (series 4, image 72) and 4.5 x 3.2 cm on the left (series 4, image 75). Consider dedicated pelvic ultrasound and/or pelvic MRI to further evaluate for solid components given high suspicion for GYN primary malignancy.   2. No other evidence of mass and no lymphadenopathy in the abdomen or pelvis.   3. Trace ascites. There is some suggestion of omental and peritoneal nodularity (e.g. Series 4, image 66), concerning for peritoneal metastatic disease.    4. Loculated left-sided pleural effusion with left-sided pleural drainage catheter in position. Small right pleural effusion   07/13/2018 Surgery   OPERATION: 1.  Left VATS (video-assisted thoracoscopic surgery) for drainage of loculated pleural effusion. 2.  Talc pleurodesis for malignant pleural effusion. 3.  Placement of PleurX catheter for management of malignant pleural effusion. 4.  Placement of On-Q analgesia catheter system.    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   07/13/2018 Pathology Results   Pleura, peel, Left Pleural - FIBRO-FIBRINOUS PLEURITIS - NEGATIVE FOR MALIGNANCY   07/18/2018 Initial Diagnosis   Ovarian cancer, bilateral ([facility])   07/20/2018 Procedure   EGD  impression: Normal proximal esophagus and mid esophagus. Mild distal esophageal rings; dilation  not performed because of esophagitis. LA Grade C reflux esophagitis. Z-line regular, 30 cm from the incisors. 5 cm hiatal hernia. Non-bleeding gastric ulcer with no stigmata of bleeding. Gastritis. Duodenal erosions without bleeding. Normal second portion of the duodenum. No specimens collected.   08/19/2018 Genetic Testing   Negative genetic testing on the common hereditary cancer panel.  The Common Hereditary Gene Panel offered by [facility] includes sequencing and/or deletion duplication testing of the following 48 genes: APC, ATM, AXIN2, BARD1, BMPR1A, BRCA1, BRCA2, BRIP1, CDH1, CDK4, CDKN2A (p14ARF), CDKN2A (p16INK4a), CHEK2, CTNNA1, DICER1, EPCAM (Deletion/duplication testing only), GREM1 (promoter region deletion/duplication testing only), KIT, MEN1, MLH1, MSH2, MSH3, MSH6, MUTYH, NBN, NF1, NHTL1, PALB2, PDGFRA, PMS2, POLD1, POLE, PTEN, RAD50, RAD51C, RAD51D, RNF43, SDHB, SDHC, SDHD, SMAD4, SMARCA4. STK11, TP53, TSC1, TSC2, and VHL.  The following genes were evaluated for sequence changes only: SDHA and HOXB13 c.251G>A variant only. The report date is August 19, 2018.   08/24/2018 - 03/15/2019 Chemotherapy   Patient is on Treatment Plan : OVARIAN Carboplatin (AUC 6) / Paclitaxel (175) q21d x 6 cycles      03/05/2021 -  Chemotherapy   Patient is on Treatment Plan : OVARIAN Carboplatin (AUC 6) / Paclitaxel (175) q21d x 6 cycles        CANCER STAGING:  Cancer Staging  No matching staging information was found for the patient.  INTERVAL HISTORY:  Michelle Holt, a 51 y.o. female, returns for routine follow-up and consideration for next cycle of chemotherapy. Michelle Holt was last seen on 05/28/2021.  Due for cycle #6 of Carboplatin and Taxol today.   Overall, she tells me she has been feeling pretty well. She reports fatigue. She has intermittent lower back cramping which she reports "feels like menstrual cramps". She reports good appetite. She continues to have left upper quadrant abdominal  pain. She reports the numbness in her feet is intermittent and lasts for intervals of 3 minutes.   Overall, she feels ready for next cycle of chemo today.    REVIEW OF SYSTEMS:  Review of Systems  Constitutional:  Positive for fatigue. Negative for appetite change.  HENT:   Positive for trouble swallowing.   Respiratory:  Positive for cough and shortness of breath.   Cardiovascular:  Positive for chest pain.  Gastrointestinal:  Positive for abdominal pain (L side).  Genitourinary:  Positive for frequency.   Musculoskeletal:  Positive for arthralgias (8/10) and back pain.  Neurological:  Positive for dizziness, headaches and numbness (feet).  Psychiatric/Behavioral:  Positive for depression and sleep disturbance. The patient is nervous/anxious.   All other systems reviewed and are negative.  PAST MEDICAL/SURGICAL HISTORY:  Past Medical History:  Diagnosis Date   Anemia    Anxiety and depression    Arthritis of facet joints at multiple vertebral levels    L5-S1   Constipation    Dyslipidemia    Family history of breast cancer    Family history of uterine cancer    GERD (gastroesophageal reflux disease)    History of hiatal hernia    History of kidney stones    Insomnia    Irritable bowel syndrome    Migraine    Muscle tension headache    Neuropathy of finger    Ovarian carcinoma ([facility])    ovarian   Plantar fasciitis of right foot    Port-A-Cath in place 08/20/2018   Past Surgical History:  Procedure Laterality Date  ABDOMINAL HYSTERECTOMY     BIOPSY  10/27/2020   Procedure: BIOPSY;  Surgeon: Harvel Quale, MD;  [facility];  Service: Gastroenterology;;   CHOLECYSTECTOMY  2008   COLONOSCOPY N/A 08/13/2013   Procedure: COLONOSCOPY;  Surgeon: Rogene Houston, MD;  [facility];  Service: Endoscopy;  Laterality: N/A;  230-moved to 145 Ann to notify pt   COLONOSCOPY WITH PROPOFOL N/A 11/15/2020   Procedure: COLONOSCOPY WITH PROPOFOL;   Surgeon: Rogene Houston, MD;  [facility];  Service: Endoscopy;  Laterality: N/A;  1:40   ESOPHAGEAL DILATION N/A 02/28/2021   Procedure: ESOPHAGEAL DILATION;  Surgeon: Rogene Houston, MD;  [facility];  Service: Endoscopy;  Laterality: N/A;   ESOPHAGOGASTRODUODENOSCOPY     ESOPHAGOGASTRODUODENOSCOPY (EGD) WITH PROPOFOL N/A 07/20/2018   Procedure: ESOPHAGOGASTRODUODENOSCOPY (EGD) WITH PROPOFOL;  Surgeon: Rogene Houston, MD;  [facility];  Service: Endoscopy;  Laterality: N/A;  Possible esophageal dilation.   ESOPHAGOGASTRODUODENOSCOPY (EGD) WITH PROPOFOL N/A 10/27/2020   Procedure: ESOPHAGOGASTRODUODENOSCOPY (EGD) WITH PROPOFOL;  Surgeon: Harvel Quale, MD;  [facility];  Service: Gastroenterology;  Laterality: N/A;  2:10, pt knows to arrive at 10:15   ESOPHAGOGASTRODUODENOSCOPY (EGD) WITH PROPOFOL N/A 02/28/2021   Procedure: ESOPHAGOGASTRODUODENOSCOPY (EGD) WITH PROPOFOL;  Surgeon: Rogene Houston, MD;  [facility];  Service: Endoscopy;  Laterality: N/A;  200 ASA 1   GIVENS CAPSULE STUDY N/A 11/24/2020   Procedure: GIVENS CAPSULE STUDY;  Surgeon: Rogene Houston, MD;  [facility];  Service: Endoscopy;  Laterality: N/A;  7:30   IR ANGIOGRAM SELECTIVE EACH ADDITIONAL VESSEL  08/01/2018   IR ANGIOGRAM VISCERAL SELECTIVE  08/01/2018   IR EMBO ART  VEN HEMORR LYMPH EXTRAV  INC GUIDE ROADMAPPING  08/01/2018   IR IMAGING GUIDED PORT INSERTION  08/20/2018   IR PERC PLEURAL DRAIN W/INDWELL CATH W/IMG GUIDE  07/08/2018   IR THORACENTESIS ASP PLEURAL SPACE W/IMG GUIDE  07/07/2018   IR US GUIDE VASC ACCESS RIGHT  08/01/2018   PLEURAL EFFUSION DRAINAGE Left 07/13/2018   Procedure: DRAINAGE OF LOCULATED PLEURAL EFFUSION;  Surgeon: Ivin Poot, MD;  Location: [facility];  Service: Thoracic;  Laterality: Left;   POLYPECTOMY  11/15/2020   Procedure: POLYPECTOMY;  Surgeon: Rogene Houston, MD;  [facility];  Service:  Endoscopy;;   REMOVAL OF PLEURAL DRAINAGE CATHETER Left 08/20/2018   Procedure: REMOVAL OF PLEURAL DRAINAGE CATHETER;  Surgeon: Ivin Poot, MD;  Location: [facility];  Service: Thoracic;  Laterality: Left;   REMOVAL OF PLEURAL DRAINAGE CATHETER Left 08/20/2018   Procedure: REMOVAL OF PLEURAL DRAINAGE CATHETER;  Surgeon: Ivin Poot, MD;  Location: [facility];  Service: Thoracic;  Laterality: Left;   TALC PLEURODESIS Left 07/13/2018   Procedure: Talc Pleuradesis;  Surgeon: Prescott Gum, Collier Salina, MD;  Location: [facility];  Service: Thoracic;  Laterality: Left;   TOTAL HIP ARTHROPLASTY Left 06/05/2020   Procedure: LEFT TOTAL HIP ARTHROPLASTY ANTERIOR APPROACH;  Surgeon: Leandrew Koyanagi, MD;  Location: [facility];  Service: Orthopedics;  Laterality: Left;  3-C   TUBAL LIGATION Bilateral    UTERINE ABLATION     VIDEO ASSISTED THORACOSCOPY Left 07/13/2018   Procedure: VIDEO ASSISTED THORACOSCOPY;  Surgeon: Ivin Poot, MD;  Location: [facility];  Service: Thoracic;  Laterality: Left;    SOCIAL HISTORY:  Social History   Socioeconomic History   Marital status: Widowed    Spouse name: Not on file  Number of children: 2   Years of education: 2-College   Highest education level: Not on file  Occupational History    Employer: [employer]  Tobacco Use   Smoking status: Former    Packs/day: 0.50    Years: 17.00    Pack years: 8.50    Types: Cigarettes    Quit date: 06/22/2018    Years since quitting: 2.9   Smokeless tobacco: Never  Vaping Use   Vaping Use: Never used  Substance and Sexual Activity   Alcohol use: Not Currently    Alcohol/week: 1.0 standard drink    Types: 1 Glasses of wine per week   Drug use: No   Sexual activity: Not on file  Other Topics Concern   Not on file  Social History Narrative   Patient lives at home with her daughter.    Patient has 2 children.    Patient is widowed.    Patient is right handed.    Patient has her Associates degree.      Social Determinants of Health    Financial Resource Strain: Not on file  Food Insecurity: Not on file  Transportation Needs: Not on file  Physical Activity: Not on file  Stress: Not on file  Social Connections: Not on file  Intimate Partner Violence: Not on file    FAMILY HISTORY:  Family History  Problem Relation Age of Onset   Hypertension Mother    Obesity Mother    Diabetes Mother    Kidney disease Mother    Peripheral vascular disease Father    Atrial fibrillation Father    COPD Brother    Osteoporosis Brother    Crohn's disease Sister    Uterine cancer Sister 39       maternal half sister   Breast cancer Maternal Aunt 74   Colon cancer Neg Hx     CURRENT MEDICATIONS:  Current Outpatient Medications  Medication Sig Dispense Refill   albuterol (VENTOLIN HFA) 108 (90 Base) MCG/ACT inhaler Inhale 2 puffs into the lungs every 6 (six) hours as needed for wheezing or shortness of breath.     B Complex-C (B-COMPLEX WITH VITAMIN C) tablet Take 1 tablet by mouth daily.     benzonatate (TESSALON) 200 MG capsule Take by mouth 3 (three) times daily as needed.     brompheniramine-pseudoephedrine-DM 30-2-10 MG/5ML syrup SMARTSIG:1 teaspoon By Mouth Every 8 Hours PRN     Cholecalciferol (VITAMIN D) 50 MCG (2000 UT) CAPS Take 2,000 Units by mouth daily.     dexamethasone (DECADRON) 4 MG tablet Take 1 tablet (4 mg total) by mouth daily. Three days after each treatment 30 tablet 2   diazepam (VALIUM) 5 MG tablet TAKE ONE TABLET BY MOUTH AT BEDTIME 30 tablet 5   dicyclomine (BENTYL) 10 MG capsule Take 1 capsule (10 mg total) by mouth 3 (three) times daily as needed for spasms. 90 capsule 3   fluconazole (DIFLUCAN) 100 MG tablet Take 2 tablets by mouth on the first day and then 1 tablet by mouth daily. 6 tablet 0   gabapentin (NEURONTIN) 100 MG capsule TAKE TWO (2) CAPSULES BY MOUTH AT BEDTIME 60 capsule 2   lactulose (CHRONULAC) 10 GM/15ML solution Take 15 mLs (10 g total) by mouth as needed. 15 mls every 3 hours until  bowel movement achieved, then take daily. 236 mL 0   levofloxacin (LEVAQUIN) 750 MG tablet Take 1 tablet (750 mg total) by mouth daily. 7 tablet 0   linaclotide (LINZESS) 290  MCG CAPS capsule Take 1 capsule (290 mcg total) by mouth daily before breakfast. 30 capsule 6   magic mouthwash w/lidocaine SOLN Take 5 mLs by mouth 3 (three) times daily as needed for mouth pain (Swish and spit). 480 mL 6   Magnesium 500 MG TABS Take 500 mg by mouth in the morning.     methylPREDNISolone (MEDROL DOSEPAK) 4 MG TBPK tablet Take by mouth as directed. 21 tablet 0   metoCLOPramide (REGLAN) 10 MG tablet Take 1 tablet (10 mg total) by mouth 3 (three) times daily before meals. 90 tablet 1   omeprazole (PRILOSEC) 40 MG capsule Take 1 capsule (40 mg total) by mouth 2 (two) times daily before a meal. 60 capsule 5   oxyCODONE HCl 30 MG TABA Take 1 tablet by mouth every 6 (six) hours as needed. 116 tablet 0   sennosides-docusate sodium (SENOKOT-S) 8.6-50 MG tablet Take 1 tablet by mouth daily as needed for constipation.     SUMAtriptan (IMITREX) 100 MG tablet TAKE ONE TABLET BY MOUTH PRN UP to TWICE DAILY AS NEEDED. 9 tablet 5   tiZANidine (ZANAFLEX) 4 MG tablet Take 1 tablet (4 mg total) by mouth 3 (three) times daily as needed for muscle spasms. 60 tablet 0   triamcinolone cream (KENALOG) 0.1 % Apply 1 application topically 2 (two) times daily as needed (rash). 80 g 11   venlafaxine XR (EFFEXOR-XR) 150 MG 24 hr capsule TAKE ONE CAPSULE BY MOUTH DAILY WITH BREAKFAST 30 capsule 6   lidocaine-prilocaine (EMLA) cream Apply 1 application. topically as needed (pain). (Patient not taking: Reported on 06/18/2021)     ondansetron (ZOFRAN) 4 MG tablet Take 1 tablet (4 mg total) by mouth every 8 (eight) hours as needed for nausea or vomiting. (Patient not taking: Reported on 06/18/2021) 40 tablet 3   No current facility-administered medications for this visit.   Facility-Administered Medications Ordered in Other Visits  Medication  Dose Route Frequency Provider Last Rate Last Admin   0.9 %  sodium chloride infusion   Intravenous Continuous Derek Jack, MD   Stopped at 10/23/20 1547   sodium chloride flush (NS) 0.9 % injection 10 mL  10 mL Intravenous PRN Derek Jack, MD   10 mL at 10/23/20 1544    ALLERGIES:  Allergies  Allergen Reactions   Morphine And Related Itching   Nickel Itching   Nortriptyline Other (See Comments)    Significant weight gain   Topamax [Topiramate] Diarrhea and Nausea Only   Xanax [Alprazolam]     Can't wake up    Actifed Cold-Allergy [Chlorpheniramine-Phenyleph Er] Rash   Amoxicillin Rash    Did it involve swelling of the face/tongue/throat, SOB, or low BP? Unknown Did it involve sudden or severe rash/hives, skin peeling, or any reaction on the inside of your mouth or nose? Unknown Did you need to seek medical attention at a hospital or doctor's office? Unknown When did it last happen? teenager       If all above answers are "NO", may proceed with cephalosporin use.    Codeine Hives   Erythromycin Rash   Penicillins Rash    Did it involve swelling of the face/tongue/throat, SOB, or low BP? Unknown Did it involve sudden or severe rash/hives, skin peeling, or any reaction on the inside of your mouth or nose? Unknown Did you need to seek medical attention at a hospital or doctor's office? Unknown When did it last happen? teenager       If all above answers  are "NO", may proceed with cephalosporin use.    Sudafed [Pseudoephedrine Hcl] Rash    PHYSICAL EXAM:  Performance status (ECOG): 1 - Symptomatic but completely ambulatory  Vitals:   06/18/21 0825  BP: 119/70  Pulse: 100  Resp: 18  Temp: 97.9 F (36.6 C)  SpO2: 99%   Wt Readings from Last 3 Encounters:  06/18/21 190 lb (86.2 kg)  05/28/21 185 lb (83.9 kg)  05/07/21 189 lb 6.4 oz (85.9 kg)   Physical Exam Vitals reviewed.  Constitutional:      Appearance: Normal appearance. She is obese.   Cardiovascular:     Rate and Rhythm: Normal rate and regular rhythm.     Pulses: Normal pulses.     Heart sounds: Normal heart sounds.  Pulmonary:     Effort: Pulmonary effort is normal.     Breath sounds: Normal breath sounds.  Musculoskeletal:     Right lower leg: No edema.     Left lower leg: No edema.  Neurological:     General: No focal deficit present.     Mental Status: She is alert and oriented to person, place, and time.  Psychiatric:        Mood and Affect: Mood normal.        Behavior: Behavior normal.    LABORATORY DATA:  I have reviewed the labs as listed.     Latest Ref Rng & Units 06/18/2021    8:20 AM 05/28/2021    8:33 AM 05/07/2021    8:52 AM  CBC  WBC 4.0 - 10.5 K/uL 4.2   3.7   5.5    Hemoglobin 12.0 - 15.0 g/dL 10.3   11.0   10.7    Hematocrit 36.0 - 46.0 % 31.0   32.3   32.6    Platelets 150 - 400 K/uL 125   155   159        Latest Ref Rng & Units 06/18/2021    8:20 AM 05/28/2021    8:33 AM 05/07/2021    8:52 AM  CMP  Glucose 70 - 99 mg/dL 112   112   142    BUN 6 - 20 mg/dL '10   14   14    ' Creatinine 0.44 - 1.00 mg/dL 0.63   0.53   0.56    Sodium 135 - 145 mmol/L 138   138   139    Potassium 3.5 - 5.1 mmol/L 3.8   3.7   3.7    Chloride 98 - 111 mmol/L 105   106   105    CO2 22 - 32 mmol/L '28   27   27    ' Calcium 8.9 - 10.3 mg/dL 9.0   9.1   9.0    Total Protein 6.5 - 8.1 g/dL 6.8   6.9   6.9    Total Bilirubin 0.3 - 1.2 mg/dL 0.8   0.5   0.5    Alkaline Phos 38 - 126 U/L 106   123   108    AST 15 - 41 U/L '26   23   20    ' ALT 0 - 44 U/L '20   22   18      ' DIAGNOSTIC IMAGING:  I have independently reviewed the scans and discussed with the patient. No results found.   ASSESSMENT:  1.  Clinical stage IVa high-grade serous ovarian cancer, positive cytology of left pleural effusion: -4 cycles of carboplatin and paclitaxel from 08/24/2018 through  12/01/2018. -Robotic assisted laparoscopic TAH and BSO and omentectomy on 12/24/2018, pathology showing  high-grade serous carcinoma, PT3P NX. -Germline mutation testing was negative. -3 more cycles of adjuvant chemotherapy completed on 03/15/2019. -CTAP on 04/13/2019 showed no findings of active malignancy.  28% reduction in the volume of presumed chronic hematoma/chronic fluid collection splaying the upper margin of the spleen.  Large type III hiatal hernia. -CTAP on 10/13/2019 shows no findings of recurrence or metastatic disease. - Foundation 1 shows HRD+, LOH score>16%.  MSI-stable.  MYC amplification.  T p53 mutation. - We reviewed CT CAP from 02/21/2021 which showed progressive peritoneal metastasis compared to 12/06/2020 scan.  No bowel obstruction.  Small right pleural effusion with mild enlargement of an isolated left external iliac lymph node. - Reviewed EGD from 02/28/2021 which showed hiatal hernia and normal findings. - She reported epigastric and left upper quadrant pain worse in the last 1 week which is related to progression of her malignancy. - Cycle 1 of carboplatin and paclitaxel started on 03/05/2021.   PLAN:  1.  Clinical stage IVa high-grade serous ovarian cancer, positive cytology of left pleural effusion: - CTAP on 05/04/2021 showed improvement in her malignancy. - She complains of feeling more tired after last cycle.  She is also having suprapubic cramps on and off. - Reviewed labs today which showed normal LFTs and electrolytes.  CBC was grossly normal with mild thrombocytopenia. - She will proceed with cycle 6 today without any dose modifications. - I have recommended follow-up in 4 weeks with CT CAP with contrast and tumor marker. - We talked about maintenance therapy with PARPi as she had HRD positive on previous testing.  We will consider olaparib 300 mg twice daily.  We will talk in detail at next visit.  2.  Lower back/left upper quadrant pain: - Continue oxycodone 20 mg 4 to 5 tablets daily.  Pain is well controlled.   3.  Peripheral neuropathy: - Tingling in the third,  fourth and fifth digits of both hands is stable.  She reports intermittent numbness in the feet which is lasting about 3 minutes from last cycle 5.  We will closely monitor.  4.  Iron deficiency state: - Last Feraheme on 02/19/2021.  Hemoglobin is 10.3 today with ferritin 226 and percent saturation 35.  5.  Constipation: - Continue Linzess to 90 mcg daily which is helping.  6.  Hypomagnesemia: - Magnesium is 1.5.  Increase magnesium to 3 times daily.   Orders placed this encounter:  Orders Placed This Encounter  Procedures   CT Abdomen Pelvis [place], MD [place] [phone]   I, Thana Ates, am acting as a scribe for Dr. Derek Jack.  I, Derek Jack MD, have reviewed the above documentation for accuracy and completeness, and I agree with the above.

## 2021-06-18 NOTE — Patient Instructions (Addendum)
[place] at [facility] Discharge Instructions   You were seen and examined today by Dr. Delton Coombes.  He reviewed your lab work which is normal/stable.   We will proceed with your treatment today.   Increase your magnesium to 3 times a day.  We will see you back in 4 weeks - we will repeat labs and CT scan prior to next visit.      Thank you for choosing [place] at [facility] to provide your oncology and hematology care.  To afford each patient quality time with our provider, please arrive at least 15 minutes before your scheduled appointment time.   If you have a lab appointment with the [facility] please come in thru the Main Entrance and check in at the main information desk.  You need to re-schedule your appointment should you arrive 10 or more minutes late.  We strive to give you quality time with our providers, and arriving late affects you and other patients whose appointments are after yours.  Also, if you no show three or more times for appointments you may be dismissed from the clinic at the providers discretion.     Again, thank you for choosing [facility].  Our hope is that these requests will decrease the amount of time that you wait before being seen by our physicians.       _____________________________________________________________  Should you have questions after your visit to [facility], please contact our office at [phone] and follow the prompts.  Our office hours are 8:00 a.m. and 4:30 p.m. Monday - Friday.  Please note that voicemails left after 4:00 p.m. may not be returned until the following business day.  We are closed weekends and major holidays.  You do have access to a nurse 24-7, just call the main number to the clinic [phone] and do not press any options, hold on the line and a nurse will answer the phone.    For prescription refill requests, have your pharmacy  contact our office and allow 72 hours.    Due to Covid, you will need to wear a mask upon entering the hospital. If you do not have a mask, a mask will be given to you at the Main Entrance upon arrival. For doctor visits, patients may have 1 support person age 57 or older with them. For treatment visits, patients can not have anyone with them due to social distancing guidelines and our immunocompromised population.

## 2021-06-18 NOTE — Patient Instructions (Signed)
[facility]  Discharge Instructions: Thank you for choosing [facility] to provide your oncology and hematology care.  If you have a lab appointment with the Cancer Center, please come in thru the Main Entrance and check in at the main information desk.  Wear comfortable clothing and clothing appropriate for easy access to any Portacath or PICC line.   We strive to give you quality time with your provider. You may need to reschedule your appointment if you arrive late (15 or more minutes).  Arriving late affects you and other patients whose appointments are after yours.  Also, if you miss three or more appointments without notifying the office, you may be dismissed from the clinic at the provider's discretion.      For prescription refill requests, have your pharmacy contact our office and allow 72 hours for refills to be completed.    Today you received the following chemotherapy and/or immunotherapy agents Taxol and Carboplatin.   To help prevent nausea and vomiting after your treatment, we encourage you to take your nausea medication as directed.  BELOW ARE SYMPTOMS THAT SHOULD BE REPORTED IMMEDIATELY: . *FEVER GREATER THAN 100.4 F (38 C) OR HIGHER . *CHILLS OR SWEATING . *NAUSEA AND VOMITING THAT IS NOT CONTROLLED WITH YOUR NAUSEA MEDICATION . *UNUSUAL SHORTNESS OF BREATH . *UNUSUAL BRUISING OR BLEEDING . *URINARY PROBLEMS (pain or burning when urinating, or frequent urination) . *BOWEL PROBLEMS (unusual diarrhea, constipation, pain near the anus) . TENDERNESS IN MOUTH AND THROAT WITH OR WITHOUT PRESENCE OF ULCERS (sore throat, sores in mouth, or a toothache) . UNUSUAL RASH, SWELLING OR PAIN  . UNUSUAL VAGINAL DISCHARGE OR ITCHING   Items with * indicate a potential emergency and should be followed up as soon as possible or go to the Emergency Department if any problems should occur.  Please show the CHEMOTHERAPY ALERT CARD or IMMUNOTHERAPY ALERT CARD at  check-in to the Emergency Department and triage nurse.  Should you have questions after your visit or need to cancel or reschedule your appointment, please contact [facility] [phone]  and follow the prompts.  Office hours are 8:00 a.m. to 4:30 p.m. Monday - Friday. Please note that voicemails left after 4:00 p.m. may not be returned until the following business day.  We are closed weekends and major holidays. You have access to a nurse at all times for urgent questions. Please call the main number to the clinic [phone] and follow the prompts.  For any non-urgent questions, you may also contact your provider using MyChart. We now offer e-Visits for anyone 18 and older to request care online for non-urgent symptoms. For details visit [email or web address].   Also download the MyChart app! Go to the app store, search "MyChart", open the app, select [place], and log in with your MyChart username and password.  Due to Covid, a mask is required upon entering the hospital/clinic. If you do not have a mask, one will be given to you upon arrival. For doctor visits, patients may have 1 support person aged 18 or older with them. For treatment visits, patients cannot have anyone with them due to current Covid guidelines and our immunocompromised population.  

## 2021-06-18 NOTE — Progress Notes (Signed)
Patient has been examined by Dr. Delton Coombes, and vital signs and labs have been reviewed. ANC, Creatinine, LFTs, hemoglobin, and platelets are within treatment parameters per M.D. - pt may proceed with treatment.  We will also give magnesium 2 g IV for magnesium of 1.5.

## 2021-06-19 LAB — CA 125: Cancer Antigen (CA) 125: 15.1 U/mL (ref 0.0–38.1)

## 2021-06-21 ENCOUNTER — Ambulatory Visit: Payer: Medicare Other | Admitting: Orthopaedic Surgery

## 2021-06-22 ENCOUNTER — Inpatient Hospital Stay (HOSPITAL_COMMUNITY)
Admission: EM | Admit: 2021-06-22 | Discharge: 2021-06-26 | DRG: 177 | Disposition: A | Discharge status: Home / Self Care | Payer: Medicare Other | Attending: Family Medicine | Admitting: Family Medicine

## 2021-06-22 ENCOUNTER — Ambulatory Visit: Payer: Medicare Other | Admitting: Urology

## 2021-06-22 ENCOUNTER — Other Ambulatory Visit: Payer: Self-pay

## 2021-06-22 ENCOUNTER — Emergency Department (HOSPITAL_COMMUNITY): Payer: Medicare Other

## 2021-06-22 DIAGNOSIS — F419 Anxiety disorder, unspecified: Secondary | ICD-10-CM | POA: Diagnosis present

## 2021-06-22 DIAGNOSIS — Z825 Family history of asthma and other chronic lower respiratory diseases: Secondary | ICD-10-CM

## 2021-06-22 DIAGNOSIS — D72819 Decreased white blood cell count, unspecified: Secondary | ICD-10-CM

## 2021-06-22 DIAGNOSIS — Z8049 Family history of malignant neoplasm of other genital organs: Secondary | ICD-10-CM | POA: Diagnosis not present

## 2021-06-22 DIAGNOSIS — M545 Low back pain, unspecified: Secondary | ICD-10-CM

## 2021-06-22 DIAGNOSIS — K219 Gastro-esophageal reflux disease without esophagitis: Secondary | ICD-10-CM | POA: Diagnosis present

## 2021-06-22 DIAGNOSIS — D696 Thrombocytopenia, unspecified: Secondary | ICD-10-CM

## 2021-06-22 DIAGNOSIS — F32A Depression, unspecified: Secondary | ICD-10-CM | POA: Diagnosis present

## 2021-06-22 DIAGNOSIS — D539 Nutritional anemia, unspecified: Secondary | ICD-10-CM | POA: Diagnosis present

## 2021-06-22 DIAGNOSIS — Z6832 Body mass index (BMI) 32.0-32.9, adult: Secondary | ICD-10-CM | POA: Diagnosis not present

## 2021-06-22 DIAGNOSIS — C563 Malignant neoplasm of bilateral ovaries: Secondary | ICD-10-CM | POA: Diagnosis present

## 2021-06-22 DIAGNOSIS — R1012 Left upper quadrant pain: Secondary | ICD-10-CM | POA: Diagnosis present

## 2021-06-22 DIAGNOSIS — Z7189 Other specified counseling: Secondary | ICD-10-CM | POA: Diagnosis not present

## 2021-06-22 DIAGNOSIS — K589 Irritable bowel syndrome without diarrhea: Secondary | ICD-10-CM | POA: Diagnosis present

## 2021-06-22 DIAGNOSIS — J209 Acute bronchitis, unspecified: Secondary | ICD-10-CM | POA: Diagnosis present

## 2021-06-22 DIAGNOSIS — K449 Diaphragmatic hernia without obstruction or gangrene: Secondary | ICD-10-CM

## 2021-06-22 DIAGNOSIS — Z880 Allergy status to penicillin: Secondary | ICD-10-CM

## 2021-06-22 DIAGNOSIS — Z888 Allergy status to other drugs, medicaments and biological substances status: Secondary | ICD-10-CM

## 2021-06-22 DIAGNOSIS — Z833 Family history of diabetes mellitus: Secondary | ICD-10-CM

## 2021-06-22 DIAGNOSIS — D61810 Antineoplastic chemotherapy induced pancytopenia: Secondary | ICD-10-CM | POA: Diagnosis present

## 2021-06-22 DIAGNOSIS — G4700 Insomnia, unspecified: Secondary | ICD-10-CM | POA: Diagnosis present

## 2021-06-22 DIAGNOSIS — J9801 Acute bronchospasm: Secondary | ICD-10-CM

## 2021-06-22 DIAGNOSIS — G629 Polyneuropathy, unspecified: Secondary | ICD-10-CM | POA: Diagnosis present

## 2021-06-22 DIAGNOSIS — T451X5A Adverse effect of antineoplastic and immunosuppressive drugs, initial encounter: Secondary | ICD-10-CM | POA: Diagnosis present

## 2021-06-22 DIAGNOSIS — Z885 Allergy status to narcotic agent status: Secondary | ICD-10-CM

## 2021-06-22 DIAGNOSIS — R531 Weakness: Secondary | ICD-10-CM

## 2021-06-22 DIAGNOSIS — F411 Generalized anxiety disorder: Secondary | ICD-10-CM | POA: Diagnosis not present

## 2021-06-22 DIAGNOSIS — Z79899 Other long term (current) drug therapy: Secondary | ICD-10-CM

## 2021-06-22 DIAGNOSIS — Z803 Family history of malignant neoplasm of breast: Secondary | ICD-10-CM

## 2021-06-22 DIAGNOSIS — Z96642 Presence of left artificial hip joint: Secondary | ICD-10-CM | POA: Diagnosis present

## 2021-06-22 DIAGNOSIS — J189 Pneumonia, unspecified organism: Secondary | ICD-10-CM | POA: Diagnosis present

## 2021-06-22 DIAGNOSIS — E669 Obesity, unspecified: Secondary | ICD-10-CM

## 2021-06-22 DIAGNOSIS — J9601 Acute respiratory failure with hypoxia: Secondary | ICD-10-CM | POA: Diagnosis present

## 2021-06-22 DIAGNOSIS — J690 Pneumonitis due to inhalation of food and vomit: Secondary | ICD-10-CM | POA: Diagnosis present

## 2021-06-22 DIAGNOSIS — R7989 Other specified abnormal findings of blood chemistry: Secondary | ICD-10-CM | POA: Diagnosis present

## 2021-06-22 DIAGNOSIS — E785 Hyperlipidemia, unspecified: Secondary | ICD-10-CM | POA: Diagnosis present

## 2021-06-22 DIAGNOSIS — Z841 Family history of disorders of kidney and ureter: Secondary | ICD-10-CM

## 2021-06-22 DIAGNOSIS — Z515 Encounter for palliative care: Secondary | ICD-10-CM

## 2021-06-22 DIAGNOSIS — Z8249 Family history of ischemic heart disease and other diseases of the circulatory system: Secondary | ICD-10-CM

## 2021-06-22 DIAGNOSIS — Z87891 Personal history of nicotine dependence: Secondary | ICD-10-CM

## 2021-06-22 LAB — CBC WITH DIFFERENTIAL/PLATELET
Abs Immature Granulocytes: 0.01 10*3/uL (ref 0.00–0.07)
Basophils Absolute: 0 10*3/uL (ref 0.0–0.1)
Basophils Relative: 0 %
Eosinophils Absolute: 0 10*3/uL (ref 0.0–0.5)
Eosinophils Relative: 0 %
HCT: 33.2 % — ABNORMAL LOW (ref 36.0–46.0)
Hemoglobin: 11.3 g/dL — ABNORMAL LOW (ref 12.0–15.0)
Immature Granulocytes: 0 %
Lymphocytes Relative: 27 %
Lymphs Abs: 0.9 10*3/uL (ref 0.7–4.0)
MCH: 40.4 pg — ABNORMAL HIGH (ref 26.0–34.0)
MCHC: 34 g/dL (ref 30.0–36.0)
MCV: 118.6 fL — ABNORMAL HIGH (ref 80.0–100.0)
Monocytes Absolute: 0.1 10*3/uL (ref 0.1–1.0)
Monocytes Relative: 2 %
Neutro Abs: 2.5 10*3/uL (ref 1.7–7.7)
Neutrophils Relative %: 71 %
Platelets: 98 10*3/uL — ABNORMAL LOW (ref 150–400)
RBC: 2.8 MIL/uL — ABNORMAL LOW (ref 3.87–5.11)
RDW: 15.1 % (ref 11.5–15.5)
WBC: 3.5 10*3/uL — ABNORMAL LOW (ref 4.0–10.5)
nRBC: 0 % (ref 0.0–0.2)

## 2021-06-22 LAB — COMPREHENSIVE METABOLIC PANEL
ALT: 21 U/L (ref 0–44)
AST: 18 U/L (ref 15–41)
Albumin: 4 g/dL (ref 3.5–5.0)
Alkaline Phosphatase: 87 U/L (ref 38–126)
Anion gap: 6 (ref 5–15)
BUN: 14 mg/dL (ref 6–20)
CO2: 28 mmol/L (ref 22–32)
Calcium: 9.6 mg/dL (ref 8.9–10.3)
Chloride: 101 mmol/L (ref 98–111)
Creatinine, Ser: 0.59 mg/dL (ref 0.44–1.00)
GFR, Estimated: >60 mL/min (ref >60)
Glucose, Bld: 107 mg/dL — ABNORMAL HIGH (ref 70–99)
Potassium: 4.7 mmol/L (ref 3.5–5.1)
Sodium: 135 mmol/L (ref 135–145)
Total Bilirubin: 0.7 mg/dL (ref 0.3–1.2)
Total Protein: 7.5 g/dL (ref 6.5–8.1)

## 2021-06-22 LAB — TROPONIN I (HIGH SENSITIVITY)
Troponin I (High Sensitivity): 5 ng/L (ref <18)
Troponin I (High Sensitivity): 5 ng/L (ref <18)

## 2021-06-22 LAB — D-DIMER, QUANTITATIVE: D-Dimer, Quant: 0.79 ug/mL-FEU — ABNORMAL HIGH (ref 0.00–0.50)

## 2021-06-22 LAB — BRAIN NATRIURETIC PEPTIDE: B Natriuretic Peptide: 23 pg/mL (ref 0.0–100.0)

## 2021-06-22 MED ORDER — HYDROCODONE-ACETAMINOPHEN 5-325 MG PO TABS
1.0000 | ORAL_TABLET | Freq: Once | ORAL | Status: DC
  Filled 2021-06-22: qty 1

## 2021-06-22 MED ORDER — IOHEXOL 350 MG/ML SOLN
100.0000 mL | Freq: Once | INTRAVENOUS | Status: AC | PRN
  Administered 2021-06-22: 100 mL via INTRAVENOUS

## 2021-06-22 MED ORDER — HYDROMORPHONE HCL 1 MG/ML IJ SOLN
1.0000 mg | Freq: Once | INTRAMUSCULAR | Status: AC
  Administered 2021-06-22: 1 mg via INTRAVENOUS
  Filled 2021-06-22: qty 1

## 2021-06-22 MED ORDER — SODIUM CHLORIDE 0.9 % IV SOLN
500.0000 mg | Freq: Once | INTRAVENOUS | Status: AC
  Administered 2021-06-23: 500 mg via INTRAVENOUS
  Filled 2021-06-22: qty 5

## 2021-06-22 MED ORDER — SODIUM CHLORIDE 0.9 % IV SOLN
1.0000 g | Freq: Once | INTRAVENOUS | Status: AC
  Administered 2021-06-23: 1 g via INTRAVENOUS
  Filled 2021-06-22: qty 10

## 2021-06-22 MED ORDER — IPRATROPIUM-ALBUTEROL 0.5-2.5 (3) MG/3ML IN SOLN
3.0000 mL | Freq: Once | RESPIRATORY_TRACT | Status: AC
  Administered 2021-06-22: 3 mL via RESPIRATORY_TRACT
  Filled 2021-06-22: qty 3

## 2021-06-22 MED ORDER — METHYLPREDNISOLONE SODIUM SUCC 125 MG IJ SOLR
125.0000 mg | Freq: Once | INTRAMUSCULAR | Status: AC
  Administered 2021-06-22: 125 mg via INTRAVENOUS
  Filled 2021-06-22: qty 2

## 2021-06-22 MED ORDER — ONDANSETRON 8 MG PO TBDP
8.0000 mg | ORAL_TABLET | Freq: Once | ORAL | Status: AC
Start: 2021-06-22 — End: 2021-06-22
  Administered 2021-06-22: 8 mg via ORAL
  Filled 2021-06-22: qty 1

## 2021-06-22 NOTE — H&P (Incomplete)
History and Physical    Patient: Michelle Holt ZJI:[id number] DOB: 31-May-1970 DOA: 06/22/2021 DOS: the patient was seen and examined on 06/22/2021 PCP: [name], MD  Patient coming from: {Point_of_Origin:[address]}  Chief Complaint:  Chief Complaint  Patient presents with   Shortness of Breath   HPI: Michelle Holt is a 51 y.o. female with medical history significant of ***  Review of Systems: {ROS_Text:26778} Past Medical History:  Diagnosis Date   Anemia    Anxiety and depression    Arthritis of facet joints at multiple vertebral levels    L5-S1   Constipation    Dyslipidemia    Family history of breast cancer    Family history of uterine cancer    GERD (gastroesophageal reflux disease)    History of hiatal hernia    History of kidney stones    Insomnia    Irritable bowel syndrome    Migraine    Muscle tension headache    Neuropathy of finger    Ovarian carcinoma ([facility])    ovarian   Plantar fasciitis of right foot    Port-A-Cath in place 08/20/2018   Past Surgical History:  Procedure Laterality Date   ABDOMINAL HYSTERECTOMY     BIOPSY  10/27/2020   Procedure: BIOPSY;  Surgeon: Harvel Quale, MD;  [facility];  Service: Gastroenterology;;   CHOLECYSTECTOMY  2008   COLONOSCOPY N/A 08/13/2013   Procedure: COLONOSCOPY;  Surgeon: Rogene Houston, MD;  [facility];  Service: Endoscopy;  Laterality: N/A;  230-moved to 145 Ann to notify pt   COLONOSCOPY WITH PROPOFOL N/A 11/15/2020   Procedure: COLONOSCOPY WITH PROPOFOL;  Surgeon: Rogene Houston, MD;  [facility];  Service: Endoscopy;  Laterality: N/A;  1:40   ESOPHAGEAL DILATION N/A 02/28/2021   Procedure: ESOPHAGEAL DILATION;  Surgeon: Rogene Houston, MD;  [facility];  Service: Endoscopy;  Laterality: N/A;   ESOPHAGOGASTRODUODENOSCOPY     ESOPHAGOGASTRODUODENOSCOPY (EGD) WITH PROPOFOL N/A 07/20/2018   Procedure: ESOPHAGOGASTRODUODENOSCOPY (EGD) WITH PROPOFOL;   Surgeon: Rogene Houston, MD;  [facility];  Service: Endoscopy;  Laterality: N/A;  Possible esophageal dilation.   ESOPHAGOGASTRODUODENOSCOPY (EGD) WITH PROPOFOL N/A 10/27/2020   Procedure: ESOPHAGOGASTRODUODENOSCOPY (EGD) WITH PROPOFOL;  Surgeon: Harvel Quale, MD;  [facility];  Service: Gastroenterology;  Laterality: N/A;  2:10, pt knows to arrive at 10:15   ESOPHAGOGASTRODUODENOSCOPY (EGD) WITH PROPOFOL N/A 02/28/2021   Procedure: ESOPHAGOGASTRODUODENOSCOPY (EGD) WITH PROPOFOL;  Surgeon: Rogene Houston, MD;  [facility];  Service: Endoscopy;  Laterality: N/A;  200 ASA 1   GIVENS CAPSULE STUDY N/A 11/24/2020   Procedure: GIVENS CAPSULE STUDY;  Surgeon: Rogene Houston, MD;  [facility];  Service: Endoscopy;  Laterality: N/A;  7:30   IR ANGIOGRAM SELECTIVE EACH ADDITIONAL VESSEL  08/01/2018   IR ANGIOGRAM VISCERAL SELECTIVE  08/01/2018   IR EMBO ART  VEN HEMORR LYMPH EXTRAV  INC GUIDE ROADMAPPING  08/01/2018   IR IMAGING GUIDED PORT INSERTION  08/20/2018   IR PERC PLEURAL DRAIN W/INDWELL CATH W/IMG GUIDE  07/08/2018   IR THORACENTESIS ASP PLEURAL SPACE W/IMG GUIDE  07/07/2018   IR US GUIDE VASC ACCESS RIGHT  08/01/2018   PLEURAL EFFUSION DRAINAGE Left 07/13/2018   Procedure: DRAINAGE OF LOCULATED PLEURAL EFFUSION;  Surgeon: Ivin Poot, MD;  Location: [facility];  Service: Thoracic;  Laterality: Left;   POLYPECTOMY  11/15/2020   Procedure: POLYPECTOMY;  Surgeon:  Rogene Houston, MD;  [facility];  Service: Endoscopy;;   REMOVAL OF PLEURAL DRAINAGE CATHETER Left 08/20/2018   Procedure: REMOVAL OF PLEURAL DRAINAGE CATHETER;  Surgeon: Ivin Poot, MD;  Location: [place];  Service: Thoracic;  Laterality: Left;   REMOVAL OF PLEURAL DRAINAGE CATHETER Left 08/20/2018   Procedure: REMOVAL OF PLEURAL DRAINAGE CATHETER;  Surgeon: Ivin Poot, MD;  Location: [place];  Service: Thoracic;  Laterality: Left;   TALC PLEURODESIS Left  07/13/2018   Procedure: Talc Pleuradesis;  Surgeon: Prescott Gum, Collier Salina, MD;  Location: [place];  Service: Thoracic;  Laterality: Left;   TOTAL HIP ARTHROPLASTY Left 06/05/2020   Procedure: LEFT TOTAL HIP ARTHROPLASTY ANTERIOR APPROACH;  Surgeon: Leandrew Koyanagi, MD;  Location: [place];  Service: Orthopedics;  Laterality: Left;  3-C   TUBAL LIGATION Bilateral    UTERINE ABLATION     VIDEO ASSISTED THORACOSCOPY Left 07/13/2018   Procedure: VIDEO ASSISTED THORACOSCOPY;  Surgeon: Ivin Poot, MD;  Location: [facility];  Service: Thoracic;  Laterality: Left;   Social History:  reports that she quit smoking about 3 years ago. Her smoking use included cigarettes. She has a 8.50 pack-year smoking history. She has never used smokeless tobacco. She reports that she does not currently use alcohol after a past usage of about 1.0 standard drink of alcohol per week. She reports that she does not use drugs.  Allergies  Allergen Reactions   Morphine And Related Itching   Nickel Itching   Nortriptyline Other (See Comments)    Significant weight gain   Topamax [Topiramate] Diarrhea and Nausea Only   Xanax [Alprazolam]     Can't wake up    Actifed Cold-Allergy [Chlorpheniramine-Phenyleph Er] Rash   Amoxicillin Rash    Did it involve swelling of the face/tongue/throat, SOB, or low BP? Unknown Did it involve sudden or severe rash/hives, skin peeling, or any reaction on the inside of your mouth or nose? Unknown Did you need to seek medical attention at a hospital or doctor's office? Unknown When did it last happen? teenager       If all above answers are "NO", may proceed with cephalosporin use.    Codeine Hives   Erythromycin Rash   Penicillins Rash    Did it involve swelling of the face/tongue/throat, SOB, or low BP? Unknown Did it involve sudden or severe rash/hives, skin peeling, or any reaction on the inside of your mouth or nose? Unknown Did you need to seek medical attention at a hospital or doctor's office?  Unknown When did it last happen? teenager       If all above answers are "NO", may proceed with cephalosporin use.    Sudafed [Pseudoephedrine Hcl] Rash    Family History  Problem Relation Age of Onset   Hypertension Mother    Obesity Mother    Diabetes Mother    Kidney disease Mother    Peripheral vascular disease Father    Atrial fibrillation Father    COPD Brother    Osteoporosis Brother    Crohn's disease Sister    Uterine cancer Sister 31       maternal half sister   Breast cancer Maternal Aunt 76   Colon cancer Neg Hx     Prior to Admission medications   Medication Sig Start Date End Date Taking? Authorizing Provider  albuterol (VENTOLIN HFA) 108 (90 Base) MCG/ACT inhaler Inhale 2 puffs into the lungs every 6 (six) hours as needed for wheezing or  shortness of breath.    [provider]  B Complex-C (B-COMPLEX WITH VITAMIN C) tablet Take 1 tablet by mouth daily.    [provider]  benzonatate (TESSALON) 200 MG capsule Take by mouth 3 (three) times daily as needed. 05/02/21   [provider]  brompheniramine-pseudoephedrine-DM 30-2-10 MG/5ML syrup SMARTSIG:1 teaspoon By Mouth Every 8 Hours PRN 05/02/21   [provider]  Cholecalciferol (VITAMIN D) 50 MCG (2000 UT) CAPS Take 2,000 Units by mouth daily.    [provider]  dexamethasone (DECADRON) 4 MG tablet Take 1 tablet (4 mg total) by mouth daily. Three days after each treatment 03/05/21   Derek Jack, MD  diazepam (VALIUM) 5 MG tablet TAKE ONE TABLET BY MOUTH AT BEDTIME 04/30/21   Derek Jack, MD  dicyclomine (BENTYL) 10 MG capsule Take 1 capsule (10 mg total) by mouth 3 (three) times daily as needed for spasms. 03/05/21   Derek Jack, MD  fluconazole (DIFLUCAN) 100 MG tablet Take 2 tablets by mouth on the first day and then 1 tablet by mouth daily. 04/16/21   Derek Jack, MD  gabapentin (NEURONTIN) 100 MG capsule TAKE TWO (2) CAPSULES BY MOUTH AT  BEDTIME 01/29/21   Derek Jack, MD  lactulose (CHRONULAC) 10 GM/15ML solution Take 15 mLs (10 g total) by mouth as needed. 15 mls every 3 hours until bowel movement achieved, then take daily. 03/20/21   Derek Jack, MD  levofloxacin (LEVAQUIN) 750 MG tablet Take 1 tablet (750 mg total) by mouth daily. 04/23/21   Derek Jack, MD  lidocaine-prilocaine (EMLA) cream Apply 1 application. topically as needed (pain).    [provider]  linaclotide Rolan Lipa) 290 MCG CAPS capsule Take 1 capsule (290 mcg total) by mouth daily before breakfast. 05/09/21   Derek Jack, MD  magic mouthwash w/lidocaine SOLN Take 5 mLs by mouth 3 (three) times daily as needed for mouth pain (Swish and spit). 05/30/21   Derek Jack, MD  Magnesium 500 MG TABS Take 500 mg by mouth in the morning.    [provider]  methylPREDNISolone (MEDROL DOSEPAK) 4 MG TBPK tablet Take by mouth as directed. 03/07/21   Derek Jack, MD  metoCLOPramide (REGLAN) 10 MG tablet Take 1 tablet (10 mg total) by mouth 3 (three) times daily before meals. 02/28/21   Rogene Houston, MD  omeprazole (PRILOSEC) 40 MG capsule Take 1 capsule (40 mg total) by mouth 2 (two) times daily before a meal. 02/28/21   Rehman, Mechele Dawley, MD  ondansetron (ZOFRAN) 4 MG tablet Take 1 tablet (4 mg total) by mouth every 8 (eight) hours as needed for nausea or vomiting. 02/05/21   Derek Jack, MD  oxyCODONE HCl 30 MG TABA Take 1 tablet by mouth every 6 (six) hours as needed. 05/29/21   Derek Jack, MD  sennosides-docusate sodium (SENOKOT-S) 8.6-50 MG tablet Take 1 tablet by mouth daily as needed for constipation.    [provider]  SUMAtriptan (IMITREX) 100 MG tablet TAKE ONE TABLET BY MOUTH PRN UP to TWICE DAILY AS NEEDED. 08/12/18   Suzzanne Cloud, NP  tiZANidine (ZANAFLEX) 4 MG tablet Take 1 tablet (4 mg total) by mouth 3 (three) times daily as needed for muscle spasms. 01/10/21   Aundra Dubin, PA-C  triamcinolone cream (KENALOG) 0.1 % Apply 1 application topically 2 (two) times daily as needed (rash). 06/28/20   Derek Jack, MD  venlafaxine XR (EFFEXOR-XR) 150 MG 24 hr capsule TAKE ONE CAPSULE BY MOUTH DAILY WITH  BREAKFAST 01/22/21   Derek Jack, MD    Physical Exam: Vitals:   06/22/21 2029 06/22/21 2204 06/22/21 2206 06/22/21 2230  BP: 104/67   124/66  Pulse: 83  85 98  Resp: 20 18 (!) 24 19  Temp: 98.9 F (37.2 C)     TempSrc: Oral     SpO2: 95% 92% 92% 98%   *** Data Reviewed: {Tip this will not be part of the note when signed- Document your independent interpretation of telemetry tracing, EKG, lab, Radiology test or any other diagnostic tests. Add any new diagnostic test ordered today. (Optional):26781} {Results:26384}  Assessment and Plan: No notes have been filed under this hospital service. Service: Hospitalist     Advance Care Planning:   Code Status: Prior ***  Consults: ***  Family Communication: ***  Severity of Illness: {Observation/Inpatient:21159}  Author: Bernadette Hoit, DO 06/22/2021 11:59 PM  For on call review [email or web address].

## 2021-06-22 NOTE — ED Provider Notes (Signed)
[facility] EMERGENCY DEPARTMENT Provider Note   CSN: [id number] Arrival date & time: 06/22/21  2013     History  Chief Complaint  Patient presents with   Shortness of Breath    Michelle Holt is a 51 y.o. female.   Shortness of Breath   Patient with ovarian cancer on chemo last dose Monday, anemia, IBS, history of cigarette use quit in 2020 presents today due to shortness of breath.  Shortness of breath started yesterday, associated with "pain everywhere".  She is normally not on any supplemental oxygen, EMS was called to her house and when arrived found her to be hypoxic in the 80s and requiring 4 L supplemental oxygen to sat at 97%.  Patient denies any specific chest pain but does hurt all over her body, she denies any nausea or vomiting.  She has been coughing for the last few days but denies any fevers.  No history of previous PEs, and on anticoagulation.  Home Medications Prior to Admission medications   Medication Sig Start Date End Date Taking? Authorizing Provider  albuterol (VENTOLIN HFA) 108 (90 Base) MCG/ACT inhaler Inhale 2 puffs into the lungs every 6 (six) hours as needed for wheezing or shortness of breath.    [provider]  B Complex-C (B-COMPLEX WITH VITAMIN C) tablet Take 1 tablet by mouth daily.    [provider]  benzonatate (TESSALON) 200 MG capsule Take by mouth 3 (three) times daily as needed. 05/02/21   [provider]  brompheniramine-pseudoephedrine-DM 30-2-10 MG/5ML syrup SMARTSIG:1 teaspoon By Mouth Every 8 Hours PRN 05/02/21   [provider]  Cholecalciferol (VITAMIN D) 50 MCG (2000 UT) CAPS Take 2,000 Units by mouth daily.    [provider]  dexamethasone (DECADRON) 4 MG tablet Take 1 tablet (4 mg total) by mouth daily. Three days after each treatment 03/05/21   Derek Jack, MD  diazepam (VALIUM) 5 MG tablet TAKE ONE TABLET BY MOUTH AT BEDTIME 04/30/21   Derek Jack, MD  dicyclomine  (BENTYL) 10 MG capsule Take 1 capsule (10 mg total) by mouth 3 (three) times daily as needed for spasms. 03/05/21   Derek Jack, MD  fluconazole (DIFLUCAN) 100 MG tablet Take 2 tablets by mouth on the first day and then 1 tablet by mouth daily. 04/16/21   Derek Jack, MD  gabapentin (NEURONTIN) 100 MG capsule TAKE TWO (2) CAPSULES BY MOUTH AT BEDTIME 01/29/21   Derek Jack, MD  lactulose (CHRONULAC) 10 GM/15ML solution Take 15 mLs (10 g total) by mouth as needed. 15 mls every 3 hours until bowel movement achieved, then take daily. 03/20/21   Derek Jack, MD  levofloxacin (LEVAQUIN) 750 MG tablet Take 1 tablet (750 mg total) by mouth daily. 04/23/21   Derek Jack, MD  lidocaine-prilocaine (EMLA) cream Apply 1 application. topically as needed (pain).    [provider]  linaclotide Rolan Lipa) 290 MCG CAPS capsule Take 1 capsule (290 mcg total) by mouth daily before breakfast. 05/09/21   Derek Jack, MD  magic mouthwash w/lidocaine SOLN Take 5 mLs by mouth 3 (three) times daily as needed for mouth pain (Swish and spit). 05/30/21   Derek Jack, MD  Magnesium 500 MG TABS Take 500 mg by mouth in the morning.    [provider]  methylPREDNISolone (MEDROL DOSEPAK) 4 MG TBPK tablet Take by mouth as directed. 03/07/21   Derek Jack, MD  metoCLOPramide (REGLAN) 10 MG tablet Take 1 tablet (10 mg total) by mouth 3 (three) times daily  before meals. 02/28/21   Rogene Houston, MD  omeprazole (PRILOSEC) 40 MG capsule Take 1 capsule (40 mg total) by mouth 2 (two) times daily before a meal. 02/28/21   Rehman, Mechele Dawley, MD  ondansetron (ZOFRAN) 4 MG tablet Take 1 tablet (4 mg total) by mouth every 8 (eight) hours as needed for nausea or vomiting. 02/05/21   Derek Jack, MD  oxyCODONE HCl 30 MG TABA Take 1 tablet by mouth every 6 (six) hours as needed. 05/29/21   Derek Jack, MD  sennosides-docusate sodium (SENOKOT-S) 8.6-50 MG  tablet Take 1 tablet by mouth daily as needed for constipation.    [provider]  SUMAtriptan (IMITREX) 100 MG tablet TAKE ONE TABLET BY MOUTH PRN UP to TWICE DAILY AS NEEDED. 08/12/18   Suzzanne Cloud, NP  tiZANidine (ZANAFLEX) 4 MG tablet Take 1 tablet (4 mg total) by mouth 3 (three) times daily as needed for muscle spasms. 01/10/21   Aundra Dubin, PA-C  triamcinolone cream (KENALOG) 0.1 % Apply 1 application topically 2 (two) times daily as needed (rash). 06/28/20   Derek Jack, MD  venlafaxine XR (EFFEXOR-XR) 150 MG 24 hr capsule TAKE ONE CAPSULE BY MOUTH DAILY WITH BREAKFAST 01/22/21   Derek Jack, MD      Allergies    Morphine and related, Nickel, Nortriptyline, Topamax [topiramate], Xanax [alprazolam], Actifed cold-allergy [chlorpheniramine-phenyleph er], Amoxicillin, Codeine, Erythromycin, Penicillins, and Sudafed [pseudoephedrine hcl]    Review of Systems   Review of Systems  Respiratory:  Positive for shortness of breath.     Physical Exam Updated Vital Signs BP 124/66 (BP Location: Left Arm)   Pulse 98   Temp 98.9 F (37.2 C) (Oral)   Resp 19   LMP  (LMP Unknown)   SpO2 98%  Physical Exam Vitals and nursing note reviewed. Exam conducted with a chaperone present.  Constitutional:      Appearance: Normal appearance.  HENT:     Head: Normocephalic and atraumatic.  Eyes:     General: No scleral icterus.       Right eye: No discharge.        Left eye: No discharge.     Extraocular Movements: Extraocular movements intact.     Pupils: Pupils are equal, round, and reactive to light.  Cardiovascular:     Rate and Rhythm: Regular rhythm. Tachycardia present.     Pulses: Normal pulses.     Heart sounds: Normal heart sounds. No murmur heard.    No friction rub. No gallop.  Pulmonary:     Effort: Pulmonary effort is normal. Tachypnea present. No respiratory distress.     Breath sounds: Examination of the right-upper field reveals wheezing.  Examination of the left-upper field reveals wheezing. Examination of the right-middle field reveals wheezing. Examination of the left-middle field reveals wheezing. Examination of the right-lower field reveals wheezing. Examination of the left-lower field reveals decreased breath sounds. Decreased breath sounds and wheezing present.  Abdominal:     General: Abdomen is flat. Bowel sounds are normal. There is no distension.     Palpations: Abdomen is soft.     Tenderness: There is no abdominal tenderness.  Musculoskeletal:     Right lower leg: No edema.     Left lower leg: No edema.  Skin:    General: Skin is warm and dry.     Coloration: Skin is pale. Skin is not jaundiced.  Neurological:     Mental Status: She is alert. Mental status is at baseline.  Coordination: Coordination normal.     ED Results / Procedures / Treatments   Labs (all labs ordered are listed, but only abnormal results are displayed) Labs Reviewed  CBC WITH DIFFERENTIAL/PLATELET - Abnormal; Notable for the following components:      Result Value   WBC 3.5 (*)    RBC 2.80 (*)    Hemoglobin 11.3 (*)    HCT 33.2 (*)    MCV 118.6 (*)    MCH 40.4 (*)    Platelets 98 (*)    All other components within normal limits  COMPREHENSIVE METABOLIC PANEL - Abnormal; Notable for the following components:   Glucose, Bld 107 (*)    All other components within normal limits  D-DIMER, QUANTITATIVE - Abnormal; Notable for the following components:   D-Dimer, Quant 0.79 (*)    All other components within normal limits  BRAIN NATRIURETIC PEPTIDE  TROPONIN I (HIGH SENSITIVITY)  TROPONIN I (HIGH SENSITIVITY)    EKG None  Radiology CT Angio Chest PE W/Cm &/Or Wo Cm  Result Date: 06/22/2021 CLINICAL DATA:  Pulmonary embolism (PE) suspected, positive D-dimer. Pt is a cancer patient with last chemo tx on Monday. Pt also has cough; was placed on 4L by EMS, O2 was then 93%. BP 112/73, no meds given en route by EMS EXAM: CT  ANGIOGRAPHY CHEST WITH CONTRAST TECHNIQUE: Multidetector CT imaging of the chest was performed using the standard protocol during bolus administration of intravenous contrast. Multiplanar CT image reconstructions and MIPs were obtained to evaluate the vascular anatomy. RADIATION DOSE REDUCTION: This exam was performed according to the departmental dose-optimization program which includes automated exposure control, adjustment of the mA and/or kV according to patient size and/or use of iterative reconstruction technique. CONTRAST:  140m OMNIPAQUE IOHEXOL 350 MG/ML SOLN COMPARISON:  None Available. FINDINGS: Cardiovascular: Satisfactory opacification of the pulmonary arteries to the segmental level. No evidence of pulmonary embolism. The main pulmonary artery is normal in caliber. Normal heart size. No significant pericardial effusion. The thoracic aorta is normal in caliber. No atherosclerotic plaque of the thoracic aorta. No coronary artery calcifications. Mediastinum/Nodes: No enlarged mediastinal, hilar, or axillary lymph nodes. Thyroid gland, trachea, and esophagus demonstrate no significant findings. Large hiatal hernia containing the majority of the stomach. No associated bowel wall thickening or pneumatosis. No dilatation of the stomach. Lungs/Pleura: Biapical pleural/pulmonary scarring. Vague patchy peribronchovascular airspace opacity/centrilobular nodules. No pulmonary nodule. No pulmonary mass. No pleural effusion. No pneumothorax. Upper Abdomen: Vascular correlating noted along left upper abdomen. Status post cholecystectomy. Otherwise no acute abnormality. Musculoskeletal: No chest wall abnormality. No suspicious lytic or blastic osseous lesions. No acute displaced fracture. Review of the MIP images confirms the above findings. IMPRESSION: 1. No pulmonary embolus. 2. Vague patchy peribronchovascular airspace opacity/centrilobular nodules. Finding could represent infection/inflammation (such as  aspiration pneumonia). Recommend short-term 3 month CT follow-up to evaluate for stability. 3. Large hiatal hernia. No findings of associated obstruction. No findings suggest ischemia. Electronically Signed   By: MIven FinnM.D.   On: 06/22/2021 23:22   DG Chest Portable 1 View  Result Date: 06/22/2021 CLINICAL DATA:  Shortness of breath EXAM: PORTABLE CHEST 1 VIEW COMPARISON:  05/04/2021 FINDINGS: Right Port-A-Cath remains in place, unchanged. Large hiatal hernia. Heart and mediastinal contours are within normal limits. No focal opacities or effusions. No acute bony abnormality. IMPRESSION: No active cardiopulmonary disease. Large hiatal hernia. Electronically Signed   By: KRolm BaptiseM.D.   On: 06/22/2021 21:12    Procedures .Critical Care  Performed  by: Sherrill Raring, PA-C Authorized by: Sherrill Raring, PA-C   Critical care provider statement:    Critical care time (minutes):  30   Critical care start time:  06/22/2021 10:00 PM   Critical care end time:  06/22/2021 10:30 PM   Critical care time was exclusive of:  Separately billable procedures and treating other patients   Critical care was necessary to treat or prevent imminent or life-threatening deterioration of the following conditions:  Respiratory failure   Critical care was time spent personally by me on the following activities:  Development of treatment plan with patient or surrogate, discussions with consultants, evaluation of patient's response to treatment, examination of patient, ordering and review of laboratory studies, ordering and review of radiographic studies, ordering and performing treatments and interventions, pulse oximetry, re-evaluation of patient's condition and review of old charts   Care discussed with: admitting provider       Medications Ordered in ED Medications  cefTRIAXone (ROCEPHIN) 1 g in sodium chloride 0.9 % 100 mL IVPB (has no administration in time range)  azithromycin (ZITHROMAX) 500 mg in sodium  chloride 0.9 % 250 mL IVPB (has no administration in time range)  ipratropium-albuterol (DUONEB) 0.5-2.5 (3) MG/3ML nebulizer solution 3 mL (3 mLs Nebulization Given 06/22/21 2040)  methylPREDNISolone sodium succinate (SOLU-MEDROL) 125 mg/2 mL injection 125 mg (125 mg Intravenous Given 06/22/21 2201)  ipratropium-albuterol (DUONEB) 0.5-2.5 (3) MG/3ML nebulizer solution 3 mL (3 mLs Nebulization Given 06/22/21 2204)  ondansetron (ZOFRAN-ODT) disintegrating tablet 8 mg (8 mg Oral Given 06/22/21 2202)  HYDROmorphone (DILAUDID) injection 1 mg (1 mg Intravenous Given 06/22/21 2217)  iohexol (OMNIPAQUE) 350 MG/ML injection 100 mL (100 mLs Intravenous Contrast Given 06/22/21 2309)    ED Course/ Medical Decision Making/ A&P                           Medical Decision Making Amount and/or Complexity of Data Reviewed Labs: ordered. Radiology: ordered.  Risk Prescription drug management. Decision regarding hospitalization.   Patient presents due to shortness of breath.  Differential diagnosis i includes and is not limited to pneumonia, ACS, PE.  Care is complicated by patient's immunocompromise status.  She is undergoing chemotherapy for ovarian cancer.  At baseline she is not on any supplemental oxygen, today she presents by EMS needing 4 L.  In the room we are able to drop her down to 3 L, removal oxygen resulted in hypoxia to the 80s.  She is short of breath on exam, tachypneic.  There are diffuse wheezing, no history of COPD or asthma.  I did order DuoNebs, Solu-Medrol.  Patient was on cardiac monitoring, heart rate is elevated in the 90s but she is in sinus rhythm versus sinus tach for most of the time I am in the room with her.  Negative delta troponin.  BNP is within normal limits, no gross electrolyte derangement on CMP.  Dimer is elevated at 0.79.  Patient is leukopenic at 3.5, hemoglobin slightly low at 11.3 and thrombocytopenic at 98.  I ordered and viewed the EKG which shows sinus rhythm at 82.  I  ordered and viewed the chest x-ray which does not show any acute process.  CTA PE study ordered to evaluate for pulmonary embolism as etiology for hypoxia.  Reviewed the CTA PE study.  Patient has a pneumonia but no evidence of PE on exam.  I did review patient's allergy record, she has been given Ancef before without any history of  anaphylaxis allergic reaction.  We will proceed with Rocephin and azithromycin.    I will consult hospitalist for admission due to new hypoxia, pneumonia.        Final Clinical Impression(s) / ED Diagnoses Final diagnoses:  Acute respiratory failure with hypoxia ([place])  Community acquired pneumonia, unspecified laterality    Rx / DC Orders ED Discharge Orders     None         Sherrill Raring, Hershal Coria 07/04/21 4129    Luna Fuse, MD 07/13/21 1902

## 2021-06-22 NOTE — ED Triage Notes (Signed)
Pt arrives with [facility] from home c/o [facility] that started yesterday. Pt is a cancer patient with last chemo tx on Monday. Pt also has cough; was placed on 4L by EMS, O2 was then 93%. BP 112/73, no meds given en route by EMS.

## 2021-06-23 ENCOUNTER — Other Ambulatory Visit: Payer: Self-pay

## 2021-06-23 ENCOUNTER — Encounter (HOSPITAL_COMMUNITY): Payer: Self-pay | Admitting: Internal Medicine

## 2021-06-23 ENCOUNTER — Encounter (HOSPITAL_COMMUNITY): Payer: Self-pay

## 2021-06-23 DIAGNOSIS — D696 Thrombocytopenia, unspecified: Secondary | ICD-10-CM

## 2021-06-23 DIAGNOSIS — R531 Weakness: Secondary | ICD-10-CM

## 2021-06-23 DIAGNOSIS — J189 Pneumonia, unspecified organism: Secondary | ICD-10-CM | POA: Diagnosis not present

## 2021-06-23 DIAGNOSIS — C563 Malignant neoplasm of bilateral ovaries: Secondary | ICD-10-CM | POA: Diagnosis not present

## 2021-06-23 DIAGNOSIS — Z7189 Other specified counseling: Secondary | ICD-10-CM | POA: Diagnosis not present

## 2021-06-23 DIAGNOSIS — Z515 Encounter for palliative care: Secondary | ICD-10-CM | POA: Diagnosis not present

## 2021-06-23 DIAGNOSIS — J9801 Acute bronchospasm: Secondary | ICD-10-CM | POA: Diagnosis not present

## 2021-06-23 DIAGNOSIS — D72819 Decreased white blood cell count, unspecified: Secondary | ICD-10-CM | POA: Diagnosis not present

## 2021-06-23 DIAGNOSIS — R7989 Other specified abnormal findings of blood chemistry: Secondary | ICD-10-CM

## 2021-06-23 DIAGNOSIS — E669 Obesity, unspecified: Secondary | ICD-10-CM

## 2021-06-23 DIAGNOSIS — M545 Low back pain, unspecified: Secondary | ICD-10-CM

## 2021-06-23 DIAGNOSIS — K449 Diaphragmatic hernia without obstruction or gangrene: Secondary | ICD-10-CM

## 2021-06-23 DIAGNOSIS — J9601 Acute respiratory failure with hypoxia: Secondary | ICD-10-CM | POA: Diagnosis not present

## 2021-06-23 LAB — COMPREHENSIVE METABOLIC PANEL
ALT: 16 U/L (ref 0–44)
AST: 17 U/L (ref 15–41)
Albumin: 3.8 g/dL (ref 3.5–5.0)
Alkaline Phosphatase: 79 U/L (ref 38–126)
Anion gap: 13 (ref 5–15)
BUN: 19 mg/dL (ref 6–20)
CO2: 24 mmol/L (ref 22–32)
Calcium: 9.6 mg/dL (ref 8.9–10.3)
Chloride: 101 mmol/L (ref 98–111)
Creatinine, Ser: 0.63 mg/dL (ref 0.44–1.00)
GFR, Estimated: >60 mL/min (ref >60)
Glucose, Bld: 156 mg/dL — ABNORMAL HIGH (ref 70–99)
Potassium: 4 mmol/L (ref 3.5–5.1)
Sodium: 138 mmol/L (ref 135–145)
Total Bilirubin: 0.5 mg/dL (ref 0.3–1.2)
Total Protein: 7.1 g/dL (ref 6.5–8.1)

## 2021-06-23 LAB — EXPECTORATED SPUTUM ASSESSMENT W GRAM STAIN, RFLX TO RESP C

## 2021-06-23 LAB — APTT: aPTT: 25 seconds (ref 24–36)

## 2021-06-23 LAB — PROCALCITONIN: Procalcitonin: <0.1 ng/mL

## 2021-06-23 LAB — VITAMIN B12: Vitamin B-12: 291 pg/mL (ref 180–914)

## 2021-06-23 LAB — CBC
HCT: 30.5 % — ABNORMAL LOW (ref 36.0–46.0)
Hemoglobin: 9.9 g/dL — ABNORMAL LOW (ref 12.0–15.0)
MCH: 40.2 pg — ABNORMAL HIGH (ref 26.0–34.0)
MCHC: 32.5 g/dL (ref 30.0–36.0)
MCV: 124 fL — ABNORMAL HIGH (ref 80.0–100.0)
Platelets: 87 10*3/uL — ABNORMAL LOW (ref 150–400)
RBC: 2.46 MIL/uL — ABNORMAL LOW (ref 3.87–5.11)
RDW: 15.5 % (ref 11.5–15.5)
WBC: 3.5 10*3/uL — ABNORMAL LOW (ref 4.0–10.5)
nRBC: 0 % (ref 0.0–0.2)

## 2021-06-23 LAB — PHOSPHORUS: Phosphorus: 3 mg/dL (ref 2.5–4.6)

## 2021-06-23 LAB — FOLATE: Folate: 9.2 ng/mL (ref >5.9)

## 2021-06-23 LAB — STREP PNEUMONIAE URINARY ANTIGEN: Strep Pneumo Urinary Antigen: NEGATIVE

## 2021-06-23 LAB — MAGNESIUM: Magnesium: 1.6 mg/dL — ABNORMAL LOW (ref 1.7–2.4)

## 2021-06-23 MED ORDER — VENLAFAXINE HCL ER 150 MG PO CP24
150.0000 mg | ORAL_CAPSULE | Freq: Every day | ORAL | Status: DC
  Administered 2021-06-23 – 2021-06-26 (×4): 150 mg via ORAL
  Filled 2021-06-23 (×4): qty 1

## 2021-06-23 MED ORDER — VITAMIN D 25 MCG (1000 UNIT) PO TABS
2000.0000 [IU] | ORAL_TABLET | Freq: Every day | ORAL | Status: DC
  Administered 2021-06-24 – 2021-06-26 (×3): 2000 [IU] via ORAL
  Filled 2021-06-23 (×4): qty 2

## 2021-06-23 MED ORDER — IPRATROPIUM-ALBUTEROL 0.5-2.5 (3) MG/3ML IN SOLN: 3.0000 mL | RESPIRATORY_TRACT | Status: DC | PRN

## 2021-06-23 MED ORDER — SENNOSIDES-DOCUSATE SODIUM 8.6-50 MG PO TABS
1.0000 | ORAL_TABLET | Freq: Every day | ORAL | Status: DC | PRN
  Administered 2021-06-23 – 2021-06-24 (×2): 1 via ORAL
  Filled 2021-06-23 (×2): qty 1

## 2021-06-23 MED ORDER — SODIUM CHLORIDE 0.9% FLUSH
10.0000 mL | Freq: Two times a day (BID) | INTRAVENOUS | Status: DC
  Administered 2021-06-26: 10 mL

## 2021-06-23 MED ORDER — DOCUSATE SODIUM 100 MG PO CAPS
100.0000 mg | ORAL_CAPSULE | Freq: Once | ORAL | Status: AC
  Administered 2021-06-23: 100 mg via ORAL
  Filled 2021-06-23: qty 1

## 2021-06-23 MED ORDER — OXYCODONE HCL 5 MG PO TABS
20.0000 mg | ORAL_TABLET | ORAL | Status: DC | PRN
  Administered 2021-06-23 – 2021-06-26 (×12): 20 mg via ORAL
  Filled 2021-06-23 (×12): qty 4

## 2021-06-23 MED ORDER — DM-GUAIFENESIN ER 30-600 MG PO TB12
1.0000 | ORAL_TABLET | Freq: Two times a day (BID) | ORAL | Status: DC
  Administered 2021-06-23 – 2021-06-24 (×5): 1 via ORAL
  Filled 2021-06-23 (×5): qty 1

## 2021-06-23 MED ORDER — LORAZEPAM 0.5 MG PO TABS
0.5000 mg | ORAL_TABLET | Freq: Every evening | ORAL | Status: AC | PRN
  Administered 2021-06-23: 0.5 mg via ORAL
  Filled 2021-06-23: qty 1

## 2021-06-23 MED ORDER — ALBUTEROL SULFATE (2.5 MG/3ML) 0.083% IN NEBU
2.5000 mg | INHALATION_SOLUTION | Freq: Four times a day (QID) | RESPIRATORY_TRACT | Status: DC | PRN
Start: 2021-06-23 — End: 2021-06-26

## 2021-06-23 MED ORDER — IPRATROPIUM-ALBUTEROL 0.5-2.5 (3) MG/3ML IN SOLN
3.0000 mL | Freq: Four times a day (QID) | RESPIRATORY_TRACT | Status: DC
  Administered 2021-06-23 – 2021-06-26 (×12): 3 mL via RESPIRATORY_TRACT
  Filled 2021-06-23 (×12): qty 3

## 2021-06-23 MED ORDER — METHYLPREDNISOLONE SODIUM SUCC 40 MG IJ SOLR
40.0000 mg | Freq: Two times a day (BID) | INTRAMUSCULAR | Status: DC
  Administered 2021-06-23 – 2021-06-26 (×7): 40 mg via INTRAVENOUS
  Filled 2021-06-23 (×7): qty 1

## 2021-06-23 MED ORDER — ENOXAPARIN SODIUM 40 MG/0.4ML IJ SOSY
40.0000 mg | PREFILLED_SYRINGE | INTRAMUSCULAR | Status: DC
  Administered 2021-06-23 – 2021-06-25 (×3): 40 mg via SUBCUTANEOUS
  Filled 2021-06-23 (×3): qty 0.4

## 2021-06-23 MED ORDER — SODIUM CHLORIDE 0.9 % IV SOLN
1.0000 g | INTRAVENOUS | Status: DC
  Administered 2021-06-24 – 2021-06-25 (×2): 1 g via INTRAVENOUS
  Filled 2021-06-23 (×2): qty 10

## 2021-06-23 MED ORDER — ACETAMINOPHEN 325 MG PO TABS
650.0000 mg | ORAL_TABLET | Freq: Four times a day (QID) | ORAL | Status: DC | PRN
  Administered 2021-06-23 – 2021-06-25 (×2): 650 mg via ORAL
  Filled 2021-06-23 (×2): qty 2

## 2021-06-23 MED ORDER — ALBUTEROL SULFATE HFA 108 (90 BASE) MCG/ACT IN AERS
2.0000 | INHALATION_SPRAY | Freq: Four times a day (QID) | RESPIRATORY_TRACT | Status: DC | PRN
Start: 2021-06-23 — End: 2021-06-23

## 2021-06-23 MED ORDER — SODIUM CHLORIDE 0.9 % IV SOLN
500.0000 mg | INTRAVENOUS | Status: DC
  Administered 2021-06-24 – 2021-06-26 (×3): 500 mg via INTRAVENOUS
  Filled 2021-06-23 (×3): qty 5

## 2021-06-23 MED ORDER — B COMPLEX-C PO TABS
1.0000 | ORAL_TABLET | Freq: Every day | ORAL | Status: DC
  Administered 2021-06-23 – 2021-06-26 (×4): 1 via ORAL
  Filled 2021-06-23 (×4): qty 1

## 2021-06-23 MED ORDER — OXYCODONE HCL 5 MG PO TABS
30.0000 mg | ORAL_TABLET | Freq: Four times a day (QID) | ORAL | Status: DC | PRN
  Administered 2021-06-23 (×2): 30 mg via ORAL
  Filled 2021-06-23 (×2): qty 6

## 2021-06-23 MED ORDER — PHENOL 1.4 % MT LIQD
1.0000 | OROMUCOSAL | Status: DC | PRN
  Filled 2021-06-23: qty 177

## 2021-06-23 MED ORDER — SODIUM CHLORIDE 0.9% FLUSH
10.0000 mL | INTRAVENOUS | Status: DC | PRN
  Administered 2021-06-23: 10 mL

## 2021-06-23 MED ORDER — DIAZEPAM 5 MG PO TABS
5.0000 mg | ORAL_TABLET | Freq: Two times a day (BID) | ORAL | Status: DC | PRN
  Administered 2021-06-23 (×2): 5 mg via ORAL
  Filled 2021-06-23 (×2): qty 1

## 2021-06-23 MED ORDER — PANTOPRAZOLE SODIUM 40 MG PO TBEC
40.0000 mg | DELAYED_RELEASE_TABLET | Freq: Every day | ORAL | Status: DC
  Administered 2021-06-23 – 2021-06-26 (×4): 40 mg via ORAL
  Filled 2021-06-23 (×4): qty 1

## 2021-06-23 MED ORDER — HYDROMORPHONE HCL 1 MG/ML IJ SOLN
0.5000 mg | INTRAMUSCULAR | Status: DC | PRN
  Administered 2021-06-23 – 2021-06-26 (×13): 0.5 mg via INTRAVENOUS
  Filled 2021-06-23 (×13): qty 0.5

## 2021-06-23 MED ORDER — CHLORHEXIDINE GLUCONATE CLOTH 2 % EX PADS
6.0000 | MEDICATED_PAD | Freq: Every day | CUTANEOUS | Status: DC
  Administered 2021-06-24 – 2021-06-26 (×3): 6 via TOPICAL

## 2021-06-23 NOTE — TOC Initial Note (Signed)
Transition of Care [facility]) - Initial/Assessment Note    Patient Details  Name: Michelle Holt MRN: [id number] Date of Birth: 14-Dec-1970  Transition of Care [facility]) CM/SW Contact:    Leeroy Cha, RN Phone Number: 06/23/2021, 8:36 AM  Clinical Narrative:                  Transition of Care [facility]) Screening Note   Patient Details  Name: Michelle Holt Date of Birth: 06/06/1970   Transition of Care [facility]) CM/SW Contact:    Leeroy Cha, RN Phone Number: 06/23/2021, 8:36 AM    Transition of Care Department [facility]) has reviewed patient and no TOC needs have been identified at this time. We will continue to monitor patient advancement through interdisciplinary progression rounds. If new patient transition needs arise, please place a TOC consult.    Expected Discharge Plan: Home/Self Care Barriers to Discharge: No Barriers Identified   Patient Goals and CMS Choice   CMS Medicare.gov Compare Post Acute Care list provided to:: Patient    Expected Discharge Plan and Services Expected Discharge Plan: Home/Self Care   Discharge Planning Services: CM Consult   Living arrangements for the past 2 months: Single Family Home                                      Prior Living Arrangements/Services Living arrangements for the past 2 months: Single Family Home Lives with:: Self Patient language and need for interpreter reviewed:: Yes Do you feel safe going back to the place where you live?: Yes            Criminal Activity/Legal Involvement Pertinent to Current Situation/Hospitalization: No - Comment as needed  Activities of Daily Living Home Assistive Devices/Equipment: Eyeglasses ADL Screening (condition at time of admission) Patient's cognitive ability adequate to safely complete daily activities?: Yes Is the patient deaf or have difficulty hearing?: No Does the patient have difficulty seeing, even when wearing glasses/contacts?: No (uses reading  glasses) Does the patient have difficulty concentrating, remembering, or making decisions?: No Patient able to express need for assistance with ADLs?: Yes Does the patient have difficulty dressing or bathing?: No Independently performs ADLs?: Yes (appropriate for developmental age) Does the patient have difficulty walking or climbing stairs?: No Weakness of Legs: None Weakness of Arms/Hands: None  Permission Sought/Granted                  Emotional Assessment Appearance:: Appears stated age     Orientation: : Oriented to Situation, Oriented to  Time, Oriented to Place, Oriented to Self Alcohol / Substance Use: Not Applicable Psych Involvement: No (comment)  Admission diagnosis:  CAP (community acquired pneumonia) [J18.9] Acute respiratory failure with hypoxia ([place]) [J96.01] Community acquired pneumonia, unspecified laterality [J18.9] Patient Active Problem List   Diagnosis Date Noted   Leukopenia 06/23/2021   Thrombocytopenia ([place]) 06/23/2021   Elevated d-dimer 06/23/2021   Low back pain 06/23/2021   Hiatal hernia 06/23/2021   Obesity (BMI 30-39.9) 06/23/2021   Acute bronchospasm 06/23/2021   CAP (community acquired pneumonia) 06/22/2021   GERD (gastroesophageal reflux disease)    Melena 11/07/2020   Iron deficiency anemia 11/07/2020   Post-op pain 07/12/2020   Splenic lesion 07/12/2020   Status post total replacement of left hip 06/05/2020   Primary osteoarthritis of left hip 02/09/2020   Pain in right knee 02/09/2020   Palliative care patient 09/18/2018  Port-A-Cath in place 08/20/2018   Genetic testing 08/20/2018   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[place]) 08/17/2018   Splenic laceration 07/31/2018   Family history of uterine cancer    Family history of breast cancer    Macrocytic anemia 07/18/2018   Upper GI bleeding 07/18/2018   Ovarian cancer, bilateral ([place]) 07/18/2018   HCAP (healthcare-associated pneumonia) 07/18/2018   Pleural effusion  07/07/2018   Migraine 07/06/2018   Pleural effusion on left 07/06/2018   Acute respiratory failure with hypoxia ([place]) 07/06/2018   Tobacco abuse 07/06/2018   Generalized anxiety disorder 05/02/2014   Unspecified constipation 08/10/2013   Rectal bleeding 08/10/2013   Lumbosacral spondylosis without myelopathy 10/23/2011   Intractable migraine without aura 10/23/2011   PCP:  [name], MD Pharmacy:   [facility], [address] [place] [address] Phone: [phone] Fax: [phone]  [facility] [phone] - [place], [place] AT [place] [address] [place] [address] Phone: [phone] Fax: [phone]     Social Determinants of Health (SDOH) Interventions    Readmission Risk Interventions     No data to display

## 2021-06-23 NOTE — Progress Notes (Signed)
[facility]  PROGRESS NOTE  Michelle Holt JEH:[id number] DOB: [birth year] DOA: 06/22/2021 PCP: [name], MD   Brief HPI:   51 year old femalewith medical history significant of stage IVA high-grade serous ovarian cancer, low back/left upper quadrant pain, peripheral neuropathy, iron deficiency anemia who presents to the emergency department via EMS from home due to shortness of breath.  Patient had last dose of chemotherapy (carboplatin and paclitaxel) on Monday (6/5) after which she complained of some wheezes which worsened on Wednesday and presents with increased work of breathing by yesterday and EMS was activated.  On arrival of EMS team, patient was noted to be hypoxic in the 80s and supplemental oxygen at 4 LPM was provided with improved oxygenation to 97%.  She complained of several days of nonproductive cough and generalized body ache but denies chest pain, fevers,, headache, nausea, abdominal, vomiting.  CT angiography chest with contrast showed no pulmonary embolus, but showed vague patchy peribronchovascular airspace opacity/centrilobular nodules. Finding could represent infection/inflammation (such as aspiration pneumonia).   Subjective   Patient seen and examined, still has wheezing.   Assessment/Plan:    Acute hypoxemic respiratory failure -Presumed community-acquired pneumonia -CTA chest was suggestive of pneumonia -Patient started on ceftriaxone and Zithromax -We will follow blood culture -Strep pneumo urinary antigen is negative, urine Legionella is pending  Acute bronchitis -Continue Solu-Medrol, Mucinex, Zithromax -, DuoNeb every 6 hours  Leukopenia/thrombocytopenia -Likely side effect of chemotherapy -Follow CBC in a.m.  Macrocytic anemia MCV 118.6, vitamin B12 and folate levels will be checked   Elevated D-dimer CT angiography of chest ruled out PE   Ovarian cancer Patient continues to follow-up with Dr. Delton Coombes (heme/Onc) Last visit was on  6/5  Low back/LUQ pain Continue oxycodone as needed  GERD Continue Protonix  Hiatal hernia Stable  Obesity (BMI 32.61) Diet and lifestyle modification]   Medications     B-complex with vitamin C  1 tablet Oral Daily   Chlorhexidine Gluconate Cloth  6 each Topical Daily   cholecalciferol  2,000 Units Oral Daily   dextromethorphan-guaiFENesin  1 tablet Oral BID   ipratropium-albuterol  3 mL Nebulization Q6H   methylPREDNISolone (SOLU-MEDROL) injection  40 mg Intravenous Q12H   pantoprazole  40 mg Oral Daily   sodium chloride flush  10-40 mL Intracatheter Q12H   venlafaxine XR  150 mg Oral Q breakfast     Data Reviewed:   CBG:  No results for input(s): "GLUCAP" in the last 168 hours.  SpO2: 96 % O2 Flow Rate (L/min): 2 L/min FiO2 (%): 28 %    Vitals:   06/23/21 0925 06/23/21 1100 06/23/21 1318 06/23/21 1401  BP: 123/64  (!) 127/52   Pulse: 97  93   Resp: 18  19   Temp: 98.2 F (36.8 C)  99 F (37.2 C)   TempSrc: Oral  Oral   SpO2: 95% 98% 96% 96%  Weight:      Height:          Data Reviewed:  Basic Metabolic Panel: Recent Labs  Lab 06/18/21 0820 06/22/21 2041  NA 138 135  K 3.8 4.7  CL 105 101  CO2 28 28  GLUCOSE 112* 107*  BUN 10 14  CREATININE 0.63 0.59  CALCIUM 9.0 9.6  MG 1.5*  --     CBC: Recent Labs  Lab 06/18/21 0820 06/22/21 2041  WBC 4.2 3.5*  NEUTROABS 2.4 2.5  HGB 10.3* 11.3*  HCT 31.0* 33.2*  MCV 122.5* 118.6*  PLT 125*  98*    LFT Recent Labs  Lab 06/18/21 0820 06/22/21 2041  AST 26 18  ALT 20 21  ALKPHOS 106 87  BILITOT 0.8 0.7  PROT 6.8 7.5  ALBUMIN 3.7 4.0     Antibiotics: Anti-infectives (From admission, onward)    Start     Dose/Rate Route Frequency Ordered Stop   06/24/21 0200  cefTRIAXone (ROCEPHIN) 1 g in sodium chloride 0.9 % 100 mL IVPB        1 g 200 mL/hr over 30 Minutes Intravenous Every 24 hours 06/23/21 0310     06/24/21 0200  azithromycin (ZITHROMAX) 500 mg in sodium chloride 0.9 % 250  mL IVPB        500 mg 250 mL/hr over 60 Minutes Intravenous Every 24 hours 06/23/21 0310 06/28/21 0159   06/22/21 2330  cefTRIAXone (ROCEPHIN) 1 g in sodium chloride 0.9 % 100 mL IVPB        1 g 200 mL/hr over 30 Minutes Intravenous  Once 06/22/21 2326 06/23/21 0220   06/22/21 2330  azithromycin (ZITHROMAX) 500 mg in sodium chloride 0.9 % 250 mL IVPB        500 mg 250 mL/hr over 60 Minutes Intravenous  Once 06/22/21 2326 06/23/21 0315        DVT prophylaxis: Lovenox  Code Status: Full code  Family Communication: No family at bedside   CONSULTS    Objective    Physical Examination:   General-appears in no acute distress Heart-S1-S2, regular, no murmur auscultated Lungs-bilateral wheezing auscultated Abdomen-soft, nontender, no organomegaly Extremities-no edema in the lower extremities Neuro-alert, oriented x3, no focal deficit noted  Status is: Inpatient: [place]   [facility] If 7PM-7AM, please contact night-coverage at [email or web address], Office  [phone]   06/23/2021, 4:20 PM  LOS: 1 day

## 2021-06-23 NOTE — ED Notes (Signed)
Transfer consent signed. 

## 2021-06-23 NOTE — Consult Note (Signed)
Consultation Note Date: 06/23/2021   Patient Name: Michelle Holt  DOB: 12/10/1970  MRN: [id number]  Age / Sex: 51 y.o., female  PCP: [name], MD Referring Physician: Oswald Hillock, MD  Reason for Consultation: Establishing goals of care  HPI/Patient Profile: 51 y.o. female  admitted on 06/22/2021    Clinical Assessment and Goals of Care: 51 year old from [address], [place] transferred from [facility], her primary oncologist is Dr. Raliegh Ip, left limiting illness of stage IVa high-grade serous ovarian cancer, history of peripheral neuropathy iron deficiency anemia recently got chemotherapy admitted with the complaints of increased work of breathing and wheezing and was found to have low oxygen saturations.  CT scan with possible infection/inflammation, patient admitted to hospital medicine service and undergoing treatment for possible aspiration pneumonia.  Palliative medicine team consulted for symptom management and broad goals of care discussions. Patient is awake alert resting in bed.  She is a little tearful and feels overwhelmed and complains of headache and generalized pain. Palliative medicine is specialized medical care for people living with serious illness. It focuses on providing relief from the symptoms and stress of a serious illness. The goal is to improve quality of life for both the patient and the family. Goals of care: Broad aims of medical therapy in relation to the patient's values and preferences. Our aim is to provide medical care aimed at enabling patients to achieve the goals that matter most to them, given the circumstances of their particular medical situation and their constraints.  Medication history reviewed, appreciate bedside RN assistance.  Call placed and discussed with Alphia Moh as well, updated her to the best of her ability about scope of current  hospitalization. NEXT OF KIN  Sister   SUMMARY OF RECOMMENDATIONS    Full Code/Full Scope for now.  Monitor pain regimen.  Monitor pain and on pain symptom management regimen. Recommend outpatient palliative care towards the end of this hospitalization.  Code Status/Advance Care Planning: Full code   Symptom Management:     Palliative Prophylaxis:  Frequent Pain Assessment  Additional Recommendations (Limitations, Scope, Preferences): Full Scope Treatment  Psycho-social/Spiritual:  Desire for further Chaplaincy support:yes Additional Recommendations: Caregiving  Support/Resources  Prognosis:  Unable to determine  Discharge Planning: To Be Determined      Primary Diagnoses: Present on Admission:  CAP (community acquired pneumonia)  Ovarian cancer, bilateral ([place])   I have reviewed the medical record, interviewed the patient and family, and examined the patient. The following aspects are pertinent.  Past Medical History:  Diagnosis Date   Anemia    Anxiety and depression    Arthritis of facet joints at multiple vertebral levels    L5-S1   Constipation    Dyslipidemia    Family history of breast cancer    Family history of uterine cancer    GERD (gastroesophageal reflux disease)    History of hiatal hernia    History of kidney stones    Insomnia    Irritable bowel syndrome  Migraine    Muscle tension headache    Neuropathy of finger    Ovarian carcinoma (HCC)    ovarian   Plantar fasciitis of right foot    Port-A-Cath in place 08/20/2018   Social History   Socioeconomic History   Marital status: Widowed    Spouse name: Not on file   Number of children: 2   Years of education: 2-College   Highest education level: Not on file  Occupational History    Employer: [employer]  Tobacco Use   Smoking status: Former    Packs/day: 0.50    Years: 17.00    Total pack years: 8.50    Types: Cigarettes    Quit date: 06/22/2018    Years since quitting: 3.0    Smokeless tobacco: Never  Vaping Use   Vaping Use: Never used  Substance and Sexual Activity   Alcohol use: Not Currently    Alcohol/week: 1.0 standard drink of alcohol    Types: 1 Glasses of wine per week   Drug use: No   Sexual activity: Not on file  Other Topics Concern   Not on file  Social History Narrative   Patient lives at home with her daughter.    Patient has 2 children.    Patient is widowed.    Patient is right handed.    Patient has her Associates degree.      Social Determinants of Health   Financial Resource Strain: Low Risk  (07/22/2018)   Overall Financial Resource Strain (CARDIA)    Difficulty of Paying Living Expenses: Not hard at all  Food Insecurity: No Food Insecurity (07/22/2018)   Hunger Vital Sign    Worried About Running Out of Food in the Last Year: Never true    Ran Out of Food in the Last Year: Never true  Transportation Needs: No Transportation Needs (07/22/2018)   PRAPARE - Hydrologist (Medical): No    Lack of Transportation (Non-Medical): No  Physical Activity: Inactive (07/22/2018)   Exercise Vital Sign    Days of Exercise per Week: 0 days    Minutes of Exercise per Session: 0 min  Stress: Stress Concern Present (07/22/2018)   [place]    Feeling of Stress : Very much  Social Connections: Moderately Isolated (07/22/2018)   Social Connection and Isolation Panel [NHANES]    Frequency of Communication with Friends and Family: More than three times a week    Frequency of Social Gatherings with Friends and Family: More than three times a week    Attends Religious Services: 1 to 4 times per year    Active Member of [employer] or Organizations: No    Attends Archivist Meetings: Never    Marital Status: Widowed   Family History  Problem Relation Age of Onset   Hypertension Mother    Obesity Mother    Diabetes Mother    Kidney disease Mother     Peripheral vascular disease Father    Atrial fibrillation Father    COPD Brother    Osteoporosis Brother    Crohn's disease Sister    Uterine cancer Sister 53       maternal half sister   Breast cancer Maternal Aunt 48   Colon cancer Neg Hx    Scheduled Meds:  B-complex with vitamin C  1 tablet Oral Daily   Chlorhexidine Gluconate Cloth  6 each Topical Daily   cholecalciferol  2,000  Units Oral Daily   dextromethorphan-guaiFENesin  1 tablet Oral BID   ipratropium-albuterol  3 mL Nebulization Q6H   methylPREDNISolone (SOLU-MEDROL) injection  40 mg Intravenous Q12H   pantoprazole  40 mg Oral Daily   sodium chloride flush  10-40 mL Intracatheter Q12H   venlafaxine XR  150 mg Oral Q breakfast   Continuous Infusions:  [START ON 06/24/2021] azithromycin     [START ON 06/24/2021] cefTRIAXone (ROCEPHIN)  IV     PRN Meds:.acetaminophen, albuterol, diazepam, HYDROmorphone (DILAUDID) injection, ipratropium-albuterol, oxyCODONE, senna-docusate, sodium chloride flush Medications Prior to Admission:  Prior to Admission medications   Medication Sig Start Date End Date Taking? Authorizing Provider  albuterol (VENTOLIN HFA) 108 (90 Base) MCG/ACT inhaler Inhale 2 puffs into the lungs every 6 (six) hours as needed for wheezing or shortness of breath.   Yes [provider]  B Complex-C (B-COMPLEX WITH VITAMIN C) tablet Take 1 tablet by mouth daily.   Yes [provider]  dexamethasone (DECADRON) 4 MG tablet Take 1 tablet (4 mg total) by mouth daily. Three days after each treatment 03/05/21  Yes Derek Jack, MD  diazepam (VALIUM) 5 MG tablet TAKE ONE TABLET BY MOUTH AT BEDTIME Patient taking differently: Take 5 mg by mouth at bedtime. 04/30/21  Yes Derek Jack, MD  dicyclomine (BENTYL) 10 MG capsule Take 1 capsule (10 mg total) by mouth 3 (three) times daily as needed for spasms. 03/05/21  Yes Derek Jack, MD  gabapentin (NEURONTIN) 100 MG capsule TAKE TWO (2)  CAPSULES BY MOUTH AT BEDTIME Patient taking differently: Take 100 mg by mouth daily as needed (for nerve pain). 01/29/21  Yes Derek Jack, MD  lactulose (CHRONULAC) 10 GM/15ML solution Take 15 mLs (10 g total) by mouth as needed. 15 mls every 3 hours until bowel movement achieved, then take daily. 03/20/21  Yes Derek Jack, MD  lidocaine-prilocaine (EMLA) cream Apply 1 application. topically as needed (pain).   Yes [provider]  linaclotide (LINZESS) 290 MCG CAPS capsule Take 1 capsule (290 mcg total) by mouth daily before breakfast. Patient taking differently: Take 290 mcg by mouth daily as needed (if no BM after a week). 05/09/21  Yes Derek Jack, MD  magic mouthwash w/lidocaine SOLN Take 5 mLs by mouth 3 (three) times daily as needed for mouth pain (Swish and spit). 05/30/21  Yes Derek Jack, MD  Magnesium 500 MG TABS Take 500 mg by mouth in the morning.   Yes [provider]  omeprazole (PRILOSEC) 40 MG capsule Take 1 capsule (40 mg total) by mouth 2 (two) times daily before a meal. 02/28/21  Yes Rehman, Mechele Dawley, MD  ondansetron (ZOFRAN) 4 MG tablet Take 1 tablet (4 mg total) by mouth every 8 (eight) hours as needed for nausea or vomiting. 02/05/21  Yes Derek Jack, MD  oxyCODONE HCl 30 MG TABA Take 1 tablet by mouth every 6 (six) hours as needed. Patient taking differently: Take 1 tablet by mouth every 6 (six) hours as needed (for severe pain). 05/29/21  Yes Derek Jack, MD  sennosides-docusate sodium (SENOKOT-S) 8.6-50 MG tablet Take 1 tablet by mouth 2 (two) times daily.   Yes [provider]  SUMAtriptan (IMITREX) 100 MG tablet TAKE ONE TABLET BY MOUTH PRN UP to TWICE DAILY AS NEEDED. Patient taking differently: Take 100 mg by mouth 2 (two) times daily as needed for migraine. 08/12/18  Yes Suzzanne Cloud, NP  tiZANidine (ZANAFLEX) 4 MG tablet Take 1 tablet (4 mg total) by mouth 3 (  three) times daily as needed for  muscle spasms. Patient taking differently: Take 4 mg by mouth daily as needed for muscle spasms. 01/10/21  Yes Aundra Dubin, PA-C  triamcinolone cream (KENALOG) 0.1 % Apply 1 application topically 2 (two) times daily as needed (rash). 06/28/20  Yes Derek Jack, MD  venlafaxine XR (EFFEXOR-XR) 150 MG 24 hr capsule TAKE ONE CAPSULE BY MOUTH DAILY WITH BREAKFAST Patient taking differently: Take 150 mg by mouth daily with breakfast. 01/22/21  Yes Derek Jack, MD  Cholecalciferol (VITAMIN D) 50 MCG (2000 UT) CAPS Take 2,000 Units by mouth daily. Patient not taking: Reported on 06/23/2021    [provider]  levofloxacin (LEVAQUIN) 750 MG tablet Take 1 tablet (750 mg total) by mouth daily. Patient not taking: Reported on 06/23/2021 04/23/21   Derek Jack, MD   Allergies  Allergen Reactions   Morphine And Related Itching   Nickel Itching   Nortriptyline Other (See Comments)    Significant weight gain   Topamax [Topiramate] Diarrhea and Nausea Only   Xanax [Alprazolam]     Can't wake up    Actifed Cold-Allergy [Chlorpheniramine-Phenyleph Er] Rash    Red dye only   Amoxicillin Rash    Did it involve swelling of the face/tongue/throat, SOB, or low BP? Unknown Did it involve sudden or severe rash/hives, skin peeling, or any reaction on the inside of your mouth or nose? Unknown Did you need to seek medical attention at a hospital or doctor's office? Unknown When did it last happen? teenager       If all above answers are "NO", may proceed with cephalosporin use.    Codeine Hives   Erythromycin Rash   Penicillins Rash    Did it involve swelling of the face/tongue/throat, SOB, or low BP? Unknown Did it involve sudden or severe rash/hives, skin peeling, or any reaction on the inside of your mouth or nose? Unknown Did you need to seek medical attention at a hospital or doctor's office? Unknown When did it last happen? teenager       If all above answers are "NO",  may proceed with cephalosporin use.    Sudafed [Pseudoephedrine Hcl] Rash    Red dye only   Review of Systems Planes of headache and generalized discomfort Physical Exam Flat affect, feels overwhelmed Regular work of breathing Complains of a headache and generalized pain  Vital Signs: BP (!) 127/52 (BP Location: Left Arm)   Pulse 93   Temp 99 F (37.2 C) (Oral)   Resp 19   Ht '5\' 4"'$  (1.626 m)   Wt 85.6 kg   LMP  (LMP Unknown)   SpO2 96%   BMI 32.39 kg/m  Pain Scale: 0-10   Pain Score: Asleep  SpO2: SpO2: 96 % O2 Device:SpO2: 96 % O2 Flow Rate: .O2 Flow Rate (L/min): 2 L/min  IO: Intake/output summary:  Intake/Output Summary (Last 24 hours) at 06/23/2021 1500 Last data filed at 06/23/2021 1400 Gross per 24 hour  Intake 1080 ml  Output 600 ml  Net 480 ml    LBM: Last BM Date : 06/22/21 Baseline Weight: Weight: 85.6 kg Most recent weight: Weight: 85.6 kg     Palliative Assessment/Data:     Palliative performance scale 50%  Time In: 1400 Time Out: 1500 Time Total: 60 Greater than 50%  of this time was spent counseling and coordinating care related to the above assessment and plan.  Signed by: Loistine Chance, MD   Please contact Palliative Medicine Team phone  at [phone] for questions and concerns.  For individual provider: See Shea Evans

## 2021-06-23 NOTE — H&P (Signed)
History and Physical    Patient: Michelle Holt MHD:[id number] DOB: 11-Feb-1970 DOA: 06/22/2021 DOS: the patient was seen and examined on 06/23/2021 PCP: [name], MD  Patient coming from: Home  Chief Complaint:  Chief Complaint  Patient presents with   Shortness of Breath   HPI: Michelle Holt is a 51 y.o. female with medical history significant of stage IVA high-grade serous ovarian cancer, low back/left upper quadrant pain, peripheral neuropathy, iron deficiency anemia who presents to the emergency department via EMS from home due to shortness of breath.  Patient had last dose of chemotherapy (carboplatin and paclitaxel) on Monday (6/5) after which she complained of some wheezes which worsened on Wednesday and presents with increased work of breathing by yesterday and EMS was activated.  On arrival of EMS team, patient was noted to be hypoxic in the 80s and supplemental oxygen at 4 LPM was provided with improved oxygenation to 97%.  She complained of several days of nonproductive cough and generalized body ache but denies chest pain, fevers,, headache, nausea, abdominal, vomiting.  ED Course:  In the emergency department, she was intermittently tachypneic but other vital signs are within normal range.  O2 sat ranged within 93-98% on room air.  Work-up in the ED showed leukopenia, macrocytic anemia, thrombocytopenia, D-dimer 0.79, BMP was normal except for CBG of 106. CT angiography chest with contrast showed no pulmonary embolus, but showed vague patchy peribronchovascular airspace opacity/centrilobular nodules. Finding could represent infection/inflammation (such as aspiration pneumonia).  Patient was started on IV ceftriaxone and azithromycin, breathing treatment was provided, Dilaudid was given due to pain, IV Solu-Medrol 125 mg x 1 was given.  Hospitalist was asked to admit patient for further evaluation and management.  Review of Systems: Review of systems as noted in the HPI. All  other systems reviewed and are negative.   Past Medical History:  Diagnosis Date   Anemia    Anxiety and depression    Arthritis of facet joints at multiple vertebral levels    L5-S1   Constipation    Dyslipidemia    Family history of breast cancer    Family history of uterine cancer    GERD (gastroesophageal reflux disease)    History of hiatal hernia    History of kidney stones    Insomnia    Irritable bowel syndrome    Migraine    Muscle tension headache    Neuropathy of finger    Ovarian carcinoma (HCC)    ovarian   Plantar fasciitis of right foot    Port-A-Cath in place 08/20/2018   Past Surgical History:  Procedure Laterality Date   ABDOMINAL HYSTERECTOMY     BIOPSY  10/27/2020   Procedure: BIOPSY;  Surgeon: Harvel Quale, MD;  [facility];  Service: Gastroenterology;;   CHOLECYSTECTOMY  2008   COLONOSCOPY N/A 08/13/2013   Procedure: COLONOSCOPY;  Surgeon: Rogene Houston, MD;  [facility];  Service: Endoscopy;  Laterality: N/A;  230-moved to 145 Ann to notify pt   COLONOSCOPY WITH PROPOFOL N/A 11/15/2020   Procedure: COLONOSCOPY WITH PROPOFOL;  Surgeon: Rogene Houston, MD;  [facility];  Service: Endoscopy;  Laterality: N/A;  1:40   ESOPHAGEAL DILATION N/A 02/28/2021   Procedure: ESOPHAGEAL DILATION;  Surgeon: Rogene Houston, MD;  [facility];  Service: Endoscopy;  Laterality: N/A;   ESOPHAGOGASTRODUODENOSCOPY     ESOPHAGOGASTRODUODENOSCOPY (EGD) WITH PROPOFOL N/A 07/20/2018   Procedure: ESOPHAGOGASTRODUODENOSCOPY (EGD) WITH PROPOFOL;  Surgeon: Rogene Houston, MD;  [facility];  Service: Endoscopy;  Laterality: N/A;  Possible esophageal dilation.   ESOPHAGOGASTRODUODENOSCOPY (EGD) WITH PROPOFOL N/A 10/27/2020   Procedure: ESOPHAGOGASTRODUODENOSCOPY (EGD) WITH PROPOFOL;  Surgeon: Harvel Quale, MD;  [facility];  Service: Gastroenterology;  Laterality: N/A;  2:10, pt knows  to arrive at 10:15   ESOPHAGOGASTRODUODENOSCOPY (EGD) WITH PROPOFOL N/A 02/28/2021   Procedure: ESOPHAGOGASTRODUODENOSCOPY (EGD) WITH PROPOFOL;  Surgeon: Rogene Houston, MD;  [facility];  Service: Endoscopy;  Laterality: N/A;  200 ASA 1   GIVENS CAPSULE STUDY N/A 11/24/2020   Procedure: GIVENS CAPSULE STUDY;  Surgeon: Rogene Houston, MD;  [facility];  Service: Endoscopy;  Laterality: N/A;  7:30   IR ANGIOGRAM SELECTIVE EACH ADDITIONAL VESSEL  08/01/2018   IR ANGIOGRAM VISCERAL SELECTIVE  08/01/2018   IR EMBO ART  VEN HEMORR LYMPH EXTRAV  INC GUIDE ROADMAPPING  08/01/2018   IR IMAGING GUIDED PORT INSERTION  08/20/2018   IR PERC PLEURAL DRAIN W/INDWELL CATH W/IMG GUIDE  07/08/2018   IR THORACENTESIS ASP PLEURAL SPACE W/IMG GUIDE  07/07/2018   IR US GUIDE VASC ACCESS RIGHT  08/01/2018   PLEURAL EFFUSION DRAINAGE Left 07/13/2018   Procedure: DRAINAGE OF LOCULATED PLEURAL EFFUSION;  Surgeon: Ivin Poot, MD;  Location: [place];  Service: Thoracic;  Laterality: Left;   POLYPECTOMY  11/15/2020   Procedure: POLYPECTOMY;  Surgeon: Rogene Houston, MD;  [facility];  Service: Endoscopy;;   REMOVAL OF PLEURAL DRAINAGE CATHETER Left 08/20/2018   Procedure: REMOVAL OF PLEURAL DRAINAGE CATHETER;  Surgeon: Ivin Poot, MD;  Location: [place];  Service: Thoracic;  Laterality: Left;   REMOVAL OF PLEURAL DRAINAGE CATHETER Left 08/20/2018   Procedure: REMOVAL OF PLEURAL DRAINAGE CATHETER;  Surgeon: Ivin Poot, MD;  Location: [place];  Service: Thoracic;  Laterality: Left;   TALC PLEURODESIS Left 07/13/2018   Procedure: Talc Pleuradesis;  Surgeon: Prescott Gum, Collier Salina, MD;  Location: [place];  Service: Thoracic;  Laterality: Left;   TOTAL HIP ARTHROPLASTY Left 06/05/2020   Procedure: LEFT TOTAL HIP ARTHROPLASTY ANTERIOR APPROACH;  Surgeon: Leandrew Koyanagi, MD;  Location: [place];  Service: Orthopedics;  Laterality: Left;  3-C   TUBAL LIGATION Bilateral    UTERINE ABLATION     VIDEO  ASSISTED THORACOSCOPY Left 07/13/2018   Procedure: VIDEO ASSISTED THORACOSCOPY;  Surgeon: Ivin Poot, MD;  Location: [place];  Service: Thoracic;  Laterality: Left;    Social History:  reports that she quit smoking about 3 years ago. Her smoking use included cigarettes. She has a 8.50 pack-year smoking history. She has never used smokeless tobacco. She reports that she does not currently use alcohol after a past usage of about 1.0 standard drink of alcohol per week. She reports that she does not use drugs.   Allergies  Allergen Reactions   Morphine And Related Itching   Nickel Itching   Nortriptyline Other (See Comments)    Significant weight gain   Topamax [Topiramate] Diarrhea and Nausea Only   Xanax [Alprazolam]     Can't wake up    Actifed Cold-Allergy [Chlorpheniramine-Phenyleph Er] Rash   Amoxicillin Rash    Did it involve swelling of the face/tongue/throat, SOB, or low BP? Unknown Did it involve sudden or severe rash/hives, skin peeling, or any reaction on the inside of your mouth or nose? Unknown Did you need to seek medical attention at a hospital or doctor's office? Unknown  When did it last happen? teenager       If all above answers are "NO", may proceed with cephalosporin use.    Codeine Hives   Erythromycin Rash   Penicillins Rash    Did it involve swelling of the face/tongue/throat, SOB, or low BP? Unknown Did it involve sudden or severe rash/hives, skin peeling, or any reaction on the inside of your mouth or nose? Unknown Did you need to seek medical attention at a hospital or doctor's office? Unknown When did it last happen? teenager       If all above answers are "NO", may proceed with cephalosporin use.    Sudafed [Pseudoephedrine Hcl] Rash    Family History  Problem Relation Age of Onset   Hypertension Mother    Obesity Mother    Diabetes Mother    Kidney disease Mother    Peripheral vascular disease Father    Atrial fibrillation Father    COPD  Brother    Osteoporosis Brother    Crohn's disease Sister    Uterine cancer Sister 15       maternal half sister   Breast cancer Maternal Aunt 86   Colon cancer Neg Hx      Prior to Admission medications   Medication Sig Start Date End Date Taking? Authorizing Provider  albuterol (VENTOLIN HFA) 108 (90 Base) MCG/ACT inhaler Inhale 2 puffs into the lungs every 6 (six) hours as needed for wheezing or shortness of breath.    [provider]  B Complex-C (B-COMPLEX WITH VITAMIN C) tablet Take 1 tablet by mouth daily.    [provider]  benzonatate (TESSALON) 200 MG capsule Take by mouth 3 (three) times daily as needed. 05/02/21   [provider]  brompheniramine-pseudoephedrine-DM 30-2-10 MG/5ML syrup SMARTSIG:1 teaspoon By Mouth Every 8 Hours PRN 05/02/21   [provider]  Cholecalciferol (VITAMIN D) 50 MCG (2000 UT) CAPS Take 2,000 Units by mouth daily.    [provider]  dexamethasone (DECADRON) 4 MG tablet Take 1 tablet (4 mg total) by mouth daily. Three days after each treatment 03/05/21   Derek Jack, MD  diazepam (VALIUM) 5 MG tablet TAKE ONE TABLET BY MOUTH AT BEDTIME 04/30/21   Derek Jack, MD  dicyclomine (BENTYL) 10 MG capsule Take 1 capsule (10 mg total) by mouth 3 (three) times daily as needed for spasms. 03/05/21   Derek Jack, MD  fluconazole (DIFLUCAN) 100 MG tablet Take 2 tablets by mouth on the first day and then 1 tablet by mouth daily. 04/16/21   Derek Jack, MD  gabapentin (NEURONTIN) 100 MG capsule TAKE TWO (2) CAPSULES BY MOUTH AT BEDTIME 01/29/21   Derek Jack, MD  lactulose (CHRONULAC) 10 GM/15ML solution Take 15 mLs (10 g total) by mouth as needed. 15 mls every 3 hours until bowel movement achieved, then take daily. 03/20/21   Derek Jack, MD  levofloxacin (LEVAQUIN) 750 MG tablet Take 1 tablet (750 mg total) by mouth daily. 04/23/21   Derek Jack, MD  lidocaine-prilocaine  (EMLA) cream Apply 1 application. topically as needed (pain).    [provider]  linaclotide Rolan Lipa) 290 MCG CAPS capsule Take 1 capsule (290 mcg total) by mouth daily before breakfast. 05/09/21   Derek Jack, MD  magic mouthwash w/lidocaine SOLN Take 5 mLs by mouth 3 (three) times daily as needed for mouth pain (Swish and spit). 05/30/21   Derek Jack, MD  Magnesium 500 MG TABS Take 500 mg by mouth in the morning.  [provider]  methylPREDNISolone (MEDROL DOSEPAK) 4 MG TBPK tablet Take by mouth as directed. 03/07/21   Derek Jack, MD  metoCLOPramide (REGLAN) 10 MG tablet Take 1 tablet (10 mg total) by mouth 3 (three) times daily before meals. 02/28/21   Rogene Houston, MD  omeprazole (PRILOSEC) 40 MG capsule Take 1 capsule (40 mg total) by mouth 2 (two) times daily before a meal. 02/28/21   Rehman, Mechele Dawley, MD  ondansetron (ZOFRAN) 4 MG tablet Take 1 tablet (4 mg total) by mouth every 8 (eight) hours as needed for nausea or vomiting. 02/05/21   Derek Jack, MD  oxyCODONE HCl 30 MG TABA Take 1 tablet by mouth every 6 (six) hours as needed. 05/29/21   Derek Jack, MD  sennosides-docusate sodium (SENOKOT-S) 8.6-50 MG tablet Take 1 tablet by mouth daily as needed for constipation.    [provider]  SUMAtriptan (IMITREX) 100 MG tablet TAKE ONE TABLET BY MOUTH PRN UP to TWICE DAILY AS NEEDED. 08/12/18   Suzzanne Cloud, NP  tiZANidine (ZANAFLEX) 4 MG tablet Take 1 tablet (4 mg total) by mouth 3 (three) times daily as needed for muscle spasms. 01/10/21   Aundra Dubin, PA-C  triamcinolone cream (KENALOG) 0.1 % Apply 1 application topically 2 (two) times daily as needed (rash). 06/28/20   Derek Jack, MD  venlafaxine XR (EFFEXOR-XR) 150 MG 24 hr capsule TAKE ONE CAPSULE BY MOUTH DAILY WITH BREAKFAST 01/22/21   Derek Jack, MD    Physical Exam: BP 100/61 (BP Location: Left Arm)   Pulse 94   Temp 97.7 F (36.5 C)  (Oral)   Resp (!) 22   LMP  (LMP Unknown)   SpO2 94%   General: 51 y.o. year-old female ill appearing, but in no acute distress.  Alert and oriented x3. HEENT: NCAT, EOMI Neck: Supple, trachea medial Cardiovascular: Regular rate and rhythm with no rubs or gallops.  No thyromegaly or JVD noted.  No lower extremity edema. 2/4 pulses in all 4 extremities. Respiratory: Coarse breath sounds and diffuse wheezing on auscultation.   Abdomen: Soft, nontender nondistended with normal bowel sounds x4 quadrants. Muskuloskeletal: No cyanosis, clubbing or edema noted bilaterally Neuro: CN II-XII intact, strength 5/5 x 4, sensation, reflexes intact Skin: No ulcerative lesions noted or rashes.  Skin is warm and dry Psychiatry: Judgement and insight appear normal. Mood is appropriate for condition and setting          Labs on Admission:  Basic Metabolic Panel: Recent Labs  Lab 06/18/21 0820 06/22/21 2041  NA 138 135  K 3.8 4.7  CL 105 101  CO2 28 28  GLUCOSE 112* 107*  BUN 10 14  CREATININE 0.63 0.59  CALCIUM 9.0 9.6  MG 1.5*  --    Liver Function Tests: Recent Labs  Lab 06/18/21 0820 06/22/21 2041  AST 26 18  ALT 20 21  ALKPHOS 106 87  BILITOT 0.8 0.7  PROT 6.8 7.5  ALBUMIN 3.7 4.0   No results for input(s): "LIPASE", "AMYLASE" in the last 168 hours. No results for input(s): "AMMONIA" in the last 168 hours. CBC: Recent Labs  Lab 06/18/21 0820 06/22/21 2041  WBC 4.2 3.5*  NEUTROABS 2.4 2.5  HGB 10.3* 11.3*  HCT 31.0* 33.2*  MCV 122.5* 118.6*  PLT 125* 98*   Cardiac Enzymes: No results for input(s): "CKTOTAL", "CKMB", "CKMBINDEX", "TROPONINI" in the last 168 hours.  BNP (last 3 results) Recent Labs    06/22/21 2041  BNP 23.0  ProBNP (last 3 results) No results for input(s): "PROBNP" in the last 8760 hours.  CBG: No results for input(s): "GLUCAP" in the last 168 hours.  Radiological Exams on Admission: CT Angio Chest PE W/Cm &/Or Wo Cm  Result Date:  06/22/2021 CLINICAL DATA:  Pulmonary embolism (PE) suspected, positive D-dimer. Pt is a cancer patient with last chemo tx on Monday. Pt also has cough; was placed on 4L by EMS, O2 was then 93%. BP 112/73, no meds given en route by EMS EXAM: CT ANGIOGRAPHY CHEST WITH CONTRAST TECHNIQUE: Multidetector CT imaging of the chest was performed using the standard protocol during bolus administration of intravenous contrast. Multiplanar CT image reconstructions and MIPs were obtained to evaluate the vascular anatomy. RADIATION DOSE REDUCTION: This exam was performed according to the departmental dose-optimization program which includes automated exposure control, adjustment of the mA and/or kV according to patient size and/or use of iterative reconstruction technique. CONTRAST:  148m OMNIPAQUE IOHEXOL 350 MG/ML SOLN COMPARISON:  None Available. FINDINGS: Cardiovascular: Satisfactory opacification of the pulmonary arteries to the segmental level. No evidence of pulmonary embolism. The main pulmonary artery is normal in caliber. Normal heart size. No significant pericardial effusion. The thoracic aorta is normal in caliber. No atherosclerotic plaque of the thoracic aorta. No coronary artery calcifications. Mediastinum/Nodes: No enlarged mediastinal, hilar, or axillary lymph nodes. Thyroid gland, trachea, and esophagus demonstrate no significant findings. Large hiatal hernia containing the majority of the stomach. No associated bowel wall thickening or pneumatosis. No dilatation of the stomach. Lungs/Pleura: Biapical pleural/pulmonary scarring. Vague patchy peribronchovascular airspace opacity/centrilobular nodules. No pulmonary nodule. No pulmonary mass. No pleural effusion. No pneumothorax. Upper Abdomen: Vascular correlating noted along left upper abdomen. Status post cholecystectomy. Otherwise no acute abnormality. Musculoskeletal: No chest wall abnormality. No suspicious lytic or blastic osseous lesions. No acute displaced  fracture. Review of the MIP images confirms the above findings. IMPRESSION: 1. No pulmonary embolus. 2. Vague patchy peribronchovascular airspace opacity/centrilobular nodules. Finding could represent infection/inflammation (such as aspiration pneumonia). Recommend short-term 3 month CT follow-up to evaluate for stability. 3. Large hiatal hernia. No findings of associated obstruction. No findings suggest ischemia. Electronically Signed   By: MIven FinnM.D.   On: 06/22/2021 23:22   DG Chest Portable 1 View  Result Date: 06/22/2021 CLINICAL DATA:  Shortness of breath EXAM: PORTABLE CHEST 1 VIEW COMPARISON:  05/04/2021 FINDINGS: Right Port-A-Cath remains in place, unchanged. Large hiatal hernia. Heart and mediastinal contours are within normal limits. No focal opacities or effusions. No acute bony abnormality. IMPRESSION: No active cardiopulmonary disease. Large hiatal hernia. Electronically Signed   By: KRolm BaptiseM.D.   On: 06/22/2021 21:12    EKG: I independently viewed the EKG done and my findings are as followed: Normal sinus rhythm with rate of 87 bpm  Assessment/Plan Present on Admission:  CAP (community acquired pneumonia)  [place]  Principal Problem:   CAP (community acquired pneumonia) Active Problems:   Acute respiratory failure with hypoxia (HCC)   Macrocytic anemia   Ovarian cancer, bilateral (HCC)   Leukopenia   Thrombocytopenia (HCC)   Elevated d-dimer   Low back pain   Hiatal hernia   Obesity (BMI 30-39.9)  Acute respiratory failure with hypoxia secondary to presumed CAP POA CT angiography of chest was suggestive of pneumonia Patient was started on ceftriaxone and azithromycin, we shall continue same at this time with plan to de-escalate/discontinue based on blood culture, sputum culture, urine Legionella, strep pneumo and procalcitonin Continue Tylenol as needed  Continue Mucinex, incentive spirometry, flutter valve  Continue supplemental  oxygen to maintain O2 sat > 94% with plan to wean patient off oxygen as tolerated (patient does not use oxygen at baseline)  Diffuse wheezing possibly secondary to acute bronchospasm Continue duo nebs, Mucinex, Solu-Medrol, azithromycin. Continue Protonix to prevent steroid-induced ulcer Continue incentive spirometry and flutter valve Continue supplemental oxygen as described above  Leukopenia/thrombocytopenia possibly due to chemotherapy WBC 3.5, platelets 98 Continue to monitor WBC with morning labs  Macrocytic anemia MCV 118.6, vitamin B12 and folate levels will be checked  Elevated D-dimer CT angiography of chest ruled out PE  Ovarian cancer Patient continues to follow-up with Dr. Delton Coombes (heme/Onc) Last visit was on 6/5  Low back/LUQ pain Continue oxycodone as needed  GERD Continue Protonix  Hiatal hernia Stable  Obesity (BMI 32.61) Diet and lifestyle modification]  Goals of care: Palliative care will be consulted  DVT prophylaxis: SCDs  Code Status: Full code  Consults: None  Family Communication: None at bedside  Severity of Illness: The appropriate patient status for this patient is INPATIENT. Inpatient status is judged to be reasonable and necessary in order to provide the required intensity of service to ensure the patient's safety. The patient's presenting symptoms, physical exam findings, and initial radiographic and laboratory data in the context of their chronic comorbidities is felt to place them at high risk for further clinical deterioration. Furthermore, it is not anticipated that the patient will be medically stable for discharge from the hospital within 2 midnights of admission.   * I certify that at the point of admission it is my clinical judgment that the patient will require inpatient hospital care spanning beyond 2 midnights from the point of admission due to high intensity of service, high risk for further deterioration and high frequency of  surveillance required.*  Author: Bernadette Hoit, DO 06/23/2021 3:32 AM  For on call review [email or web address].

## 2021-06-23 NOTE — Plan of Care (Signed)
  Problem: Nutrition: Goal: Adequate nutrition will be maintained Outcome: Progressing   

## 2021-06-24 DIAGNOSIS — J9801 Acute bronchospasm: Secondary | ICD-10-CM | POA: Diagnosis not present

## 2021-06-24 DIAGNOSIS — Z515 Encounter for palliative care: Secondary | ICD-10-CM

## 2021-06-24 DIAGNOSIS — R531 Weakness: Secondary | ICD-10-CM

## 2021-06-24 DIAGNOSIS — F411 Generalized anxiety disorder: Secondary | ICD-10-CM

## 2021-06-24 DIAGNOSIS — D72819 Decreased white blood cell count, unspecified: Secondary | ICD-10-CM | POA: Diagnosis not present

## 2021-06-24 DIAGNOSIS — Z7189 Other specified counseling: Secondary | ICD-10-CM | POA: Diagnosis not present

## 2021-06-24 DIAGNOSIS — J9601 Acute respiratory failure with hypoxia: Secondary | ICD-10-CM | POA: Diagnosis not present

## 2021-06-24 DIAGNOSIS — J189 Pneumonia, unspecified organism: Secondary | ICD-10-CM | POA: Diagnosis not present

## 2021-06-24 LAB — CBC
HCT: 30.2 % — ABNORMAL LOW (ref 36.0–46.0)
Hemoglobin: 9.9 g/dL — ABNORMAL LOW (ref 12.0–15.0)
MCH: 40.4 pg — ABNORMAL HIGH (ref 26.0–34.0)
MCHC: 32.8 g/dL (ref 30.0–36.0)
MCV: 123.3 fL — ABNORMAL HIGH (ref 80.0–100.0)
Platelets: 82 10*3/uL — ABNORMAL LOW (ref 150–400)
RBC: 2.45 MIL/uL — ABNORMAL LOW (ref 3.87–5.11)
RDW: 14.7 % (ref 11.5–15.5)
WBC: 2.7 10*3/uL — ABNORMAL LOW (ref 4.0–10.5)
nRBC: 0 % (ref 0.0–0.2)

## 2021-06-24 LAB — COMPREHENSIVE METABOLIC PANEL
ALT: 17 U/L (ref 0–44)
AST: 18 U/L (ref 15–41)
Albumin: 3.8 g/dL (ref 3.5–5.0)
Alkaline Phosphatase: 77 U/L (ref 38–126)
Anion gap: 9 (ref 5–15)
BUN: 19 mg/dL (ref 6–20)
CO2: 24 mmol/L (ref 22–32)
Calcium: 9.3 mg/dL (ref 8.9–10.3)
Chloride: 106 mmol/L (ref 98–111)
Creatinine, Ser: 0.61 mg/dL (ref 0.44–1.00)
GFR, Estimated: >60 mL/min (ref >60)
Glucose, Bld: 208 mg/dL — ABNORMAL HIGH (ref 70–99)
Potassium: 4.5 mmol/L (ref 3.5–5.1)
Sodium: 139 mmol/L (ref 135–145)
Total Bilirubin: 0.5 mg/dL (ref 0.3–1.2)
Total Protein: 6.9 g/dL (ref 6.5–8.1)

## 2021-06-24 LAB — HIV ANTIBODY (ROUTINE TESTING W REFLEX): HIV Screen 4th Generation wRfx: NONREACTIVE

## 2021-06-24 MED ORDER — LORAZEPAM 2 MG/ML IJ SOLN
0.5000 mg | Freq: Four times a day (QID) | INTRAMUSCULAR | Status: DC | PRN
Start: 2021-06-24 — End: 2021-06-26
  Administered 2021-06-24 – 2021-06-26 (×6): 0.5 mg via INTRAVENOUS
  Filled 2021-06-24 (×6): qty 1

## 2021-06-24 NOTE — Progress Notes (Signed)
Daily Progress Note   Patient Name: Michelle Holt       Date: 06/24/2021 DOB: 11/30/1970  Age: 51 y.o. MRN#: [id number] Attending Physician: Oswald Hillock, MD Primary Care Physician: [name], MD Admit Date: 06/22/2021  Reason for Consultation/Follow-up: Establishing goals of care  Subjective:  Awake alert resting in bed, patient admits to uncontrolled anxiety and not being able to rest. She has not slept well for the past several nights.   Length of Stay: 2  Current Medications: Scheduled Meds:   B-complex with vitamin C  1 tablet Oral Daily   Chlorhexidine Gluconate Cloth  6 each Topical Daily   cholecalciferol  2,000 Units Oral Daily   dextromethorphan-guaiFENesin  1 tablet Oral BID   enoxaparin (LOVENOX) injection  40 mg Subcutaneous Q24H   ipratropium-albuterol  3 mL Nebulization Q6H   methylPREDNISolone (SOLU-MEDROL) injection  40 mg Intravenous Q12H   pantoprazole  40 mg Oral Daily   sodium chloride flush  10-40 mL Intracatheter Q12H   venlafaxine XR  150 mg Oral Q breakfast    Continuous Infusions:  azithromycin 500 mg (06/24/21 0218)   cefTRIAXone (ROCEPHIN)  IV 1 g (06/24/21 0133)    PRN Meds: acetaminophen, albuterol, HYDROmorphone (DILAUDID) injection, ipratropium-albuterol, LORazepam, oxyCODONE, phenol, senna-docusate, sodium chloride flush  Physical Exam         Awake alert  Resting in bed Appears anxious Regular work of breathing S 1 S 2 Has some LE edema No focal deficits.   Vital Signs: BP (!) 115/53 (BP Location: Left Arm)   Pulse 89   Temp 97.8 F (36.6 C) (Oral)   Resp 18   Ht '5\' 4"'$  (1.626 m)   Wt 85.6 kg   LMP  (LMP Unknown)   SpO2 97%   BMI 32.39 kg/m  SpO2: SpO2: 97 % O2 Device: O2 Device: Nasal Cannula O2 Flow Rate: O2 Flow Rate  (L/min): 2 L/min  Intake/output summary:  Intake/Output Summary (Last 24 hours) at 06/24/2021 1334 Last data filed at 06/24/2021 1000 Gross per 24 hour  Intake 1986.03 ml  Output 850 ml  Net 1136.03 ml   LBM: Last BM Date : 06/22/21 Baseline Weight: Weight: 85.6 kg Most recent weight: Weight: 85.6 kg       Palliative Assessment/Data:      Patient Active  Problem List   Diagnosis Date Noted   Leukopenia 06/23/2021   Thrombocytopenia ([place]) 06/23/2021   Elevated d-dimer 06/23/2021   Low back pain 06/23/2021   Hiatal hernia 06/23/2021   Obesity (BMI 30-39.9) 06/23/2021   Acute bronchospasm 06/23/2021   CAP (community acquired pneumonia) 06/22/2021   GERD (gastroesophageal reflux disease)    Melena 11/07/2020   Iron deficiency anemia 11/07/2020   Post-op pain 07/12/2020   Splenic lesion 07/12/2020   Status post total replacement of left hip 06/05/2020   Primary osteoarthritis of left hip 02/09/2020   Pain in right knee 02/09/2020   Palliative care patient 09/18/2018   Port-A-Cath in place 08/20/2018   Genetic testing 08/20/2018   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[place]) 08/17/2018   Splenic laceration 07/31/2018   Family history of uterine cancer    Family history of breast cancer    Macrocytic anemia 07/18/2018   Upper GI bleeding 07/18/2018   Ovarian cancer, bilateral ([place]) 07/18/2018   HCAP (healthcare-associated pneumonia) 07/18/2018   Pleural effusion 07/07/2018   Migraine 07/06/2018   Pleural effusion on left 07/06/2018   Acute respiratory failure with hypoxia ([facility]) 07/06/2018   Tobacco abuse 07/06/2018   Generalized anxiety disorder 05/02/2014   Unspecified constipation 08/10/2013   Rectal bleeding 08/10/2013   Lumbosacral spondylosis without myelopathy 10/23/2011   Intractable migraine without aura 10/23/2011    Palliative Care Assessment & Plan   Patient Profile:    Assessment:  51 year old femalewith medical history significant of  stage IVA high-grade serous ovarian cancer, low back/left upper quadrant pain, peripheral neuropathy, iron deficiency anemia who presents to the emergency department via EMS from home due to shortness of breath.  Patient had last dose of chemotherapy (carboplatin and paclitaxel) on Monday (6/5) after which she complained of some wheezes which worsened on Wednesday and presents with increased work of breathing by yesterday and EMS was activated.  On arrival of EMS team, patient was noted to be hypoxic in the 80s and supplemental oxygen at 4 LPM was provided with improved oxygenation to 97%.  She complained of several days of nonproductive cough and generalized body ache but denies chest pain, fevers,, headache, nausea, abdominal, vomiting.   CT angiography chest with contrast showed no pulmonary embolus, but showed vague patchy peribronchovascular airspace opacity/centrilobular nodules. Finding could represent infection/inflammation (such as aspiration pneumonia).   Recommendations/Plan: Remains admitted to hospital medicine service, on antibiotics for aspiration PNA. Discussed with patient about her current condition and her goals of care. Pain and non pain symptom management options discussed with patient, in particular, her anxiety management options were discussed. Offered her active listening and supportive care.    Goals of Care and Additional Recommendations: Limitations on Scope of Treatment: Full Scope Treatment  Code Status:    Code Status Orders  (From admission, onward)           Start     Ordered   06/23/21 0039  Full code  Continuous        06/23/21 0038           Code Status History     Date Active Date Inactive Code Status Order ID Comments User Context   06/05/2020 1607 06/06/2020 1706 Full Code [id number]  Leandrew Koyanagi, MD Inpatient   12/29/2018 0532 12/31/2018 0315 Full Code [id number]  Dorothyann Gibbs, NP Inpatient   07/31/2018 2026 08/04/2018 1933 Full Code [id number]   Jani Gravel, MD ED   07/18/2018 2247 07/21/2018 1847 Full Code [id number]  Truett Mainland, [place] Inpatient   07/06/2018 2208 07/16/2018 2116 Full Code [id number]  Ivor Costa, MD ED       Prognosis:  Unable to determine  Discharge Planning: To Be Determined  Care plan was discussed with  patient and bedside RN, appreciate her assistance.   Thank you for allowing the Palliative Medicine Team to assist in the care of this patient.   Time In:  1300 Time Out: 1340 Total Time 40 Prolonged Time Billed  no       Greater than 50%  of this time was spent counseling and coordinating care related to the above assessment and plan.  Loistine Chance, MD  Please contact Palliative Medicine Team phone at [phone] for questions and concerns.

## 2021-06-24 NOTE — Progress Notes (Signed)
24 hour chart audit completed 

## 2021-06-24 NOTE — Progress Notes (Signed)
[facility]  PROGRESS NOTE  TRUDY KORY NOI:[id number] DOB: [birth year] DOA: 06/22/2021 PCP: [name], MD   Brief HPI:   51 year old femalewith medical history significant of stage IVA high-grade serous ovarian cancer, low back/left upper quadrant pain, peripheral neuropathy, iron deficiency anemia who presents to the emergency department via EMS from home due to shortness of breath.  Patient had last dose of chemotherapy (carboplatin and paclitaxel) on Monday (6/5) after which she complained of some wheezes which worsened on Wednesday and presents with increased work of breathing by yesterday and EMS was activated.  On arrival of EMS team, patient was noted to be hypoxic in the 80s and supplemental oxygen at 4 LPM was provided with improved oxygenation to 97%.  She complained of several days of nonproductive cough and generalized body ache but denies chest pain, fevers,, headache, nausea, abdominal, vomiting.  CT angiography chest with contrast showed no pulmonary embolus, but showed vague patchy peribronchovascular airspace opacity/centrilobular nodules. Finding could represent infection/inflammation (such as aspiration pneumonia).   Subjective   Patient seen, breathing has improved.  Still has wheezing.   Assessment/Plan:    Acute hypoxemic respiratory failure -Presumed community-acquired pneumonia -CTA chest was suggestive of pneumonia -Patient started on ceftriaxone and Zithromax -We will follow blood culture -Strep pneumo urinary antigen is negative, urine Legionella is pending  Acute bronchitis -Improved -Continue Solu-Medrol, Mucinex, Zithromax -, DuoNeb every 6 hours  Leukopenia/thrombocytopenia -Likely side effect of chemotherapy -Follow CBC in a.m.  Macrocytic anemia MCV 118.6, vitamin B12 and folate levels will be checked   Elevated D-dimer CT angiography of chest ruled out PE   Ovarian cancer Patient continues to follow-up with Dr. Delton Coombes  (heme/Onc) Last visit was on 6/5  Low back/LUQ pain Continue oxycodone as needed  GERD Continue Protonix  Hiatal hernia Stable  Obesity (BMI 32.61) Diet and lifestyle modification]   Medications     B-complex with vitamin C  1 tablet Oral Daily   Chlorhexidine Gluconate Cloth  6 each Topical Daily   cholecalciferol  2,000 Units Oral Daily   dextromethorphan-guaiFENesin  1 tablet Oral BID   enoxaparin (LOVENOX) injection  40 mg Subcutaneous Q24H   ipratropium-albuterol  3 mL Nebulization Q6H   methylPREDNISolone (SOLU-MEDROL) injection  40 mg Intravenous Q12H   pantoprazole  40 mg Oral Daily   sodium chloride flush  10-40 mL Intracatheter Q12H   venlafaxine XR  150 mg Oral Q breakfast     Data Reviewed:   CBG:  No results for input(s): "GLUCAP" in the last 168 hours.  SpO2: 95 % O2 Flow Rate (L/min): 2 L/min FiO2 (%): 28 %    Vitals:   06/24/21 0222 06/24/21 0500 06/24/21 0722 06/24/21 0952  BP:  (!) 113/52  (!) 115/50  Pulse:  90  93  Resp:  18  20  Temp:  98.1 F (36.7 C)  98.1 F (36.7 C)  TempSrc:  Oral  Oral  SpO2: 94% 96% 96% 95%  Weight:      Height:          Data Reviewed:  Basic Metabolic Panel: Recent Labs  Lab 06/18/21 0820 06/22/21 2041 06/24/21 0330  NA 138 135 139  K 3.8 4.7 4.5  CL 105 101 106  CO2 '28 28 24  '$ GLUCOSE 112* 107* 208*  BUN '10 14 19  '$ CREATININE 0.63 0.59 0.61  CALCIUM 9.0 9.6 9.3  MG 1.5*  --   --     CBC: Recent Labs  Lab  06/18/21 0820 06/22/21 2041 06/24/21 0330  WBC 4.2 3.5* 2.7*  NEUTROABS 2.4 2.5  --   HGB 10.3* 11.3* 9.9*  HCT 31.0* 33.2* 30.2*  MCV 122.5* 118.6* 123.3*  PLT 125* 98* 82*    LFT Recent Labs  Lab 06/18/21 0820 06/22/21 2041 06/24/21 0330  AST '26 18 18  '$ ALT '20 21 17  '$ ALKPHOS 106 87 77  BILITOT 0.8 0.7 0.5  PROT 6.8 7.5 6.9  ALBUMIN 3.7 4.0 3.8     Antibiotics: Anti-infectives (From admission, onward)    Start     Dose/Rate Route Frequency Ordered Stop   06/24/21  0200  cefTRIAXone (ROCEPHIN) 1 g in sodium chloride 0.9 % 100 mL IVPB        1 g 200 mL/hr over 30 Minutes Intravenous Every 24 hours 06/23/21 0310     06/24/21 0200  azithromycin (ZITHROMAX) 500 mg in sodium chloride 0.9 % 250 mL IVPB        500 mg 250 mL/hr over 60 Minutes Intravenous Every 24 hours 06/23/21 0310 06/28/21 0159   06/22/21 2330  cefTRIAXone (ROCEPHIN) 1 g in sodium chloride 0.9 % 100 mL IVPB        1 g 200 mL/hr over 30 Minutes Intravenous  Once 06/22/21 2326 06/23/21 0220   06/22/21 2330  azithromycin (ZITHROMAX) 500 mg in sodium chloride 0.9 % 250 mL IVPB        500 mg 250 mL/hr over 60 Minutes Intravenous  Once 06/22/21 2326 06/23/21 0315        DVT prophylaxis: Lovenox  Code Status: Full code  Family Communication: No family at bedside   CONSULTS    Objective    Physical Examination:   General-appears in no acute distress Heart-S1-S2, regular, no murmur auscultated Lungs-bilateral wheezing auscultated Abdomen-soft, nontender, no organomegaly Extremities-no edema in the lower extremities Neuro-alert, oriented x3, no focal deficit noted  Status is: Inpatient: [place]   [facility] If 7PM-7AM, please contact night-coverage at [email or web address], Office  [phone]   06/24/2021, 12:50 PM  LOS: 2 days

## 2021-06-25 ENCOUNTER — Encounter (HOSPITAL_COMMUNITY): Payer: Self-pay

## 2021-06-25 DIAGNOSIS — Z7189 Other specified counseling: Secondary | ICD-10-CM | POA: Diagnosis not present

## 2021-06-25 DIAGNOSIS — Z515 Encounter for palliative care: Secondary | ICD-10-CM | POA: Diagnosis not present

## 2021-06-25 DIAGNOSIS — R531 Weakness: Secondary | ICD-10-CM | POA: Diagnosis not present

## 2021-06-25 DIAGNOSIS — J189 Pneumonia, unspecified organism: Secondary | ICD-10-CM | POA: Diagnosis not present

## 2021-06-25 DIAGNOSIS — J9601 Acute respiratory failure with hypoxia: Secondary | ICD-10-CM | POA: Diagnosis not present

## 2021-06-25 LAB — CBC
HCT: 26.3 % — ABNORMAL LOW (ref 36.0–46.0)
Hemoglobin: 8.7 g/dL — ABNORMAL LOW (ref 12.0–15.0)
MCH: 41 pg — ABNORMAL HIGH (ref 26.0–34.0)
MCHC: 33.1 g/dL (ref 30.0–36.0)
MCV: 124.1 fL — ABNORMAL HIGH (ref 80.0–100.0)
Platelets: 71 10*3/uL — ABNORMAL LOW (ref 150–400)
RBC: 2.12 MIL/uL — ABNORMAL LOW (ref 3.87–5.11)
RDW: 14.7 % (ref 11.5–15.5)
WBC: 3.3 10*3/uL — ABNORMAL LOW (ref 4.0–10.5)
nRBC: 0 % (ref 0.0–0.2)

## 2021-06-25 LAB — FOLATE: Folate: 4.9 ng/mL — ABNORMAL LOW (ref >5.9)

## 2021-06-25 LAB — VITAMIN B12: Vitamin B-12: 320 pg/mL (ref 180–914)

## 2021-06-25 MED ORDER — LINACLOTIDE 145 MCG PO CAPS
290.0000 ug | ORAL_CAPSULE | Freq: Every day | ORAL | Status: DC | PRN
Start: 2021-06-25 — End: 2021-06-26

## 2021-06-25 MED ORDER — GUAIFENESIN ER 600 MG PO TB12
1200.0000 mg | ORAL_TABLET | Freq: Two times a day (BID) | ORAL | Status: DC
  Administered 2021-06-25 – 2021-06-26 (×3): 1200 mg via ORAL
  Filled 2021-06-25 (×3): qty 2

## 2021-06-25 MED ORDER — LACTULOSE 10 GM/15ML PO SOLN
10.0000 g | ORAL | Status: DC
  Administered 2021-06-25 – 2021-06-26 (×8): 10 g via ORAL
  Filled 2021-06-25 (×8): qty 15

## 2021-06-25 MED ORDER — FOLIC ACID 1 MG PO TABS
1.0000 mg | ORAL_TABLET | Freq: Every day | ORAL | Status: DC
  Administered 2021-06-25 – 2021-06-26 (×2): 1 mg via ORAL
  Filled 2021-06-25 (×2): qty 1

## 2021-06-25 NOTE — Progress Notes (Signed)
Daily Progress Note   Patient Name: Michelle Holt       Date: 06/25/2021 DOB: 1971/01/07  Age: 51 y.o. MRN#: [id number] Attending Physician: Oswald Hillock, MD Primary Care Physician: [name], MD Admit Date: 06/22/2021  Reason for Consultation/Follow-up: Establishing goals of care  Subjective:  Awake alert resting in bed, less anxious, states that visiting the chaplain was helpful, she is also on IV Ativan PRN.     Length of Stay: 3  Current Medications: Scheduled Meds:   B-complex with vitamin C  1 tablet Oral Daily   Chlorhexidine Gluconate Cloth  6 each Topical Daily   cholecalciferol  2,000 Units Oral Daily   enoxaparin (LOVENOX) injection  40 mg Subcutaneous G50I   folic acid  1 mg Oral Daily   guaiFENesin  1,200 mg Oral BID   ipratropium-albuterol  3 mL Nebulization Q6H   methylPREDNISolone (SOLU-MEDROL) injection  40 mg Intravenous Q12H   pantoprazole  40 mg Oral Daily   sodium chloride flush  10-40 mL Intracatheter Q12H   venlafaxine XR  150 mg Oral Q breakfast    Continuous Infusions:  azithromycin 500 mg (06/25/21 0247)   cefTRIAXone (ROCEPHIN)  IV 1 g (06/25/21 0109)    PRN Meds: acetaminophen, albuterol, HYDROmorphone (DILAUDID) injection, ipratropium-albuterol, LORazepam, oxyCODONE, phenol, senna-docusate, sodium chloride flush  Physical Exam         Awake alert  Resting in bed Regular work of breathing S 1 S 2 Has some LE edema No focal deficits.   Vital Signs: BP 113/62 (BP Location: Left Arm)   Pulse 95   Temp 98.5 F (36.9 C) (Oral)   Resp 18   Ht '5\' 4"'$  (1.626 m)   Wt 85.6 kg   LMP  (LMP Unknown)   SpO2 97%   BMI 32.39 kg/m  SpO2: SpO2: 97 % O2 Device: O2 Device: Nasal Cannula O2 Flow Rate: O2 Flow Rate (L/min): 2 L/min  Intake/output  summary:  Intake/Output Summary (Last 24 hours) at 06/25/2021 1321 Last data filed at 06/25/2021 0912 Gross per 24 hour  Intake 1180 ml  Output 554 ml  Net 626 ml    LBM: Last BM Date : 06/22/21 Baseline Weight: Weight: 85.6 kg Most recent weight: Weight: 85.6 kg       Palliative Assessment/Data:  Patient Active Problem List   Diagnosis Date Noted   Palliative care by specialist    Goals of care, counseling/discussion    General weakness    Leukopenia 06/23/2021   Thrombocytopenia ([facility]CC) 06/23/2021   Elevated d-dimer 06/23/2021   Low back pain 06/23/2021   Hiatal hernia 06/23/2021   Obesity (BMI 30-39.9) 06/23/2021   Acute bronchospasm 06/23/2021   CAP (community acquired pneumonia) 06/22/2021   GERD (gastroesophageal reflux disease)    Melena 11/07/2020   Iron deficiency anemia 11/07/2020   Post-op pain 07/12/2020   Splenic lesion 07/12/2020   Status post total replacement of left hip 06/05/2020   Primary osteoarthritis of left hip 02/09/2020   Pain in right knee 02/09/2020   Palliative care patient 09/18/2018   Port-A-Cath in place 08/20/2018   Genetic testing 08/20/2018   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[place]) 08/17/2018   Splenic laceration 07/31/2018   Family history of uterine cancer    Family history of breast cancer    Macrocytic anemia 07/18/2018   Upper GI bleeding 07/18/2018   Ovarian cancer, bilateral ([place]) 07/18/2018   HCAP (healthcare-associated pneumonia) 07/18/2018   Pleural effusion 07/07/2018   Migraine 07/06/2018   Pleural effusion on left 07/06/2018   Acute respiratory failure with hypoxia ([facility]) 07/06/2018   Tobacco abuse 07/06/2018   Generalized anxiety disorder 05/02/2014   Unspecified constipation 08/10/2013   Rectal bleeding 08/10/2013   Lumbosacral spondylosis without myelopathy 10/23/2011   Intractable migraine without aura 10/23/2011    Palliative Care Assessment & Plan   Patient Profile:    Assessment:   51 year old femalewith medical history significant of stage IVA high-grade serous ovarian cancer, low back/left upper quadrant pain, peripheral neuropathy, iron deficiency anemia who presents to the emergency department via EMS from home due to shortness of breath.  Patient had last dose of chemotherapy (carboplatin and paclitaxel) on Monday (6/5) after which she complained of some wheezes which worsened on Wednesday and presents with increased work of breathing by yesterday and EMS was activated.  On arrival of EMS team, patient was noted to be hypoxic in the 80s and supplemental oxygen at 4 LPM was provided with improved oxygenation to 97%.  She complained of several days of nonproductive cough and generalized body ache but denies chest pain, fevers,, headache, nausea, abdominal, vomiting.   CT angiography chest with contrast showed no pulmonary embolus, but showed vague patchy peribronchovascular airspace opacity/centrilobular nodules. Finding could represent infection/inflammation (such as aspiration pneumonia).   Recommendations/Plan: Remains admitted to hospital medicine service, on antibiotics for aspiration PNA. Discussed with patient about her current condition and her goals of care. Pain and non pain symptom management options discussed with patient, in particular, her anxiety management options were discussed. Offered her active listening and supportive care.   Full code, full scope. Consider outpatient palliative. No further PMT specific recommendations at this time.   Goals of Care and Additional Recommendations: Limitations on Scope of Treatment: Full Scope Treatment  Code Status:    Code Status Orders  (From admission, onward)           Start     Ordered   06/23/21 0039  Full code  Continuous        06/23/21 0038           Code Status History     Date Active Date Inactive Code Status Order ID Comments User Context   06/05/2020 1607 06/06/2020 1706 Full Code [id number]   Leandrew Koyanagi, MD Inpatient  12/29/2018 0532 12/31/2018 0315 Full Code [id number]  Dorothyann Gibbs, NP Inpatient   07/31/2018 2026 08/04/2018 1933 Full Code [id number]  Jani Gravel, MD ED   07/18/2018 2247 07/21/2018 1847 Full Code [id number]  Truett Mainland, DO Inpatient   07/06/2018 2208 07/16/2018 2116 Full Code [id number]  Ivor Costa, MD ED       Prognosis:  Unable to determine  Discharge Planning: To Be Determined  Care plan was discussed with  patient    Thank you for allowing the Palliative Medicine Team to assist in the care of this patient.   Time In: 12 Time Out: 1225 Total Time 25 Prolonged Time Billed  no       Greater than 50%  of this time was spent counseling and coordinating care related to the above assessment and plan.  Loistine Chance, MD  Please contact Palliative Medicine Team phone at [phone] for questions and concerns.

## 2021-06-25 NOTE — Progress Notes (Signed)
[facility]  PROGRESS NOTE  Michelle Holt STM:[id number] DOB: 1970/10/20 DOA: 06/22/2021 PCP: [name], MD   Brief HPI:   51 year old femalewith medical history significant of stage IVA high-grade serous ovarian cancer, low back/left upper quadrant pain, peripheral neuropathy, iron deficiency anemia who presents to the emergency department via EMS from home due to shortness of breath.  Patient had last dose of chemotherapy (carboplatin and paclitaxel) on Monday (6/5) after which she complained of some wheezes which worsened on Wednesday and presents with increased work of breathing by yesterday and EMS was activated.  On arrival of EMS team, patient was noted to be hypoxic in the 80s and supplemental oxygen at 4 LPM was provided with improved oxygenation to 97%.  She complained of several days of nonproductive cough and generalized body ache but denies chest pain, fevers,, headache, nausea, abdominal, vomiting.  CT angiography chest with contrast showed no pulmonary embolus, but showed vague patchy peribronchovascular airspace opacity/centrilobular nodules. Finding could represent infection/inflammation (such as aspiration pneumonia).   Subjective   Patient coughing up green-colored phlegm.   Assessment/Plan:    Acute hypoxemic respiratory failure -Presumed community-acquired pneumonia -CTA chest was suggestive of pneumonia -Patient started on ceftriaxone and Zithromax -Blood cultures x2 is negative to date -Strep pneumo urinary antigen is negative, urine Legionella is negative -Procalcitonin less than 0.10 -I will discontinue ceftriaxone and continue with Zithromax for bronchitis  Acute bronchitis -Improved -Continue Solu-Medrol, Mucinex, Zithromax -, DuoNeb every 6 hours  Leukopenia/thrombocytopenia -Likely side effect of chemotherapy -Follow CBC in a.m.  Macrocytic anemia MCV 118.6,  -B12 320, folate 4.9 -Start supplementation with folic acid 1 mg daily    Elevated D-dimer CT angiography of chest ruled out PE   Ovarian cancer Patient continues to follow-up with Dr. Delton Coombes (heme/Onc) Last visit was on 6/5  Low back/LUQ pain Continue oxycodone as needed  GERD Continue Protonix  Hiatal hernia Stable  Obesity (BMI 32.61) Diet and lifestyle modification]   Medications     B-complex with vitamin C  1 tablet Oral Daily   Chlorhexidine Gluconate Cloth  6 each Topical Daily   cholecalciferol  2,000 Units Oral Daily   enoxaparin (LOVENOX) injection  40 mg Subcutaneous G92J   folic acid  1 mg Oral Daily   guaiFENesin  1,200 mg Oral BID   ipratropium-albuterol  3 mL Nebulization Q6H   lactulose  10 g Oral Q3H   methylPREDNISolone (SOLU-MEDROL) injection  40 mg Intravenous Q12H   pantoprazole  40 mg Oral Daily   sodium chloride flush  10-40 mL Intracatheter Q12H   venlafaxine XR  150 mg Oral Q breakfast     Data Reviewed:   CBG:  No results for input(s): "GLUCAP" in the last 168 hours.  SpO2: 91 % O2 Flow Rate (L/min): 2 L/min FiO2 (%): 28 %    Vitals:   06/24/21 2027 06/25/21 0138 06/25/21 0547 06/25/21 1324  BP: (!) 110/47  113/62 124/68  Pulse: 99  95 86  Resp: '18  18 12  '$ Temp: 97.8 F (36.6 C)  98.5 F (36.9 C) 98.6 F (37 C)  TempSrc: Oral  Oral Oral  SpO2: 93% 96% 97% 91%  Weight:      Height:          Data Reviewed:  Basic Metabolic Panel: Recent Labs  Lab 06/22/21 2041 06/23/21 1452 06/24/21 0330  NA 135 138 139  K 4.7 4.0 4.5  CL 101 101 106  CO2 28 24 24  GLUCOSE 107* 156* 208*  BUN '14 19 19  '$ CREATININE 0.59 0.63 0.61  CALCIUM 9.6 9.6 9.3  MG  --  1.6*  --   PHOS  --  3.0  --     CBC: Recent Labs  Lab 06/22/21 2041 06/23/21 1452 06/24/21 0330 06/25/21 0305  WBC 3.5* 3.5* 2.7* 3.3*  NEUTROABS 2.5  --   --   --   HGB 11.3* 9.9* 9.9* 8.7*  HCT 33.2* 30.5* 30.2* 26.3*  MCV 118.6* 124.0* 123.3* 124.1*  PLT 98* 87* 82* 71*    LFT Recent Labs  Lab 06/22/21 2041  06/23/21 1452 06/24/21 0330  AST '18 17 18  '$ ALT '21 16 17  '$ ALKPHOS 87 79 77  BILITOT 0.7 0.5 0.5  PROT 7.5 7.1 6.9  ALBUMIN 4.0 3.8 3.8     Antibiotics: Anti-infectives (From admission, onward)    Start     Dose/Rate Route Frequency Ordered Stop   06/24/21 0200  cefTRIAXone (ROCEPHIN) 1 g in sodium chloride 0.9 % 100 mL IVPB        1 g 200 mL/hr over 30 Minutes Intravenous Every 24 hours 06/23/21 0310     06/24/21 0200  azithromycin (ZITHROMAX) 500 mg in sodium chloride 0.9 % 250 mL IVPB        500 mg 250 mL/hr over 60 Minutes Intravenous Every 24 hours 06/23/21 0310 06/28/21 0159   06/22/21 2330  cefTRIAXone (ROCEPHIN) 1 g in sodium chloride 0.9 % 100 mL IVPB        1 g 200 mL/hr over 30 Minutes Intravenous  Once 06/22/21 2326 06/23/21 0220   06/22/21 2330  azithromycin (ZITHROMAX) 500 mg in sodium chloride 0.9 % 250 mL IVPB        500 mg 250 mL/hr over 60 Minutes Intravenous  Once 06/22/21 2326 06/23/21 0315        DVT prophylaxis: Lovenox  Code Status: Full code  Family Communication: No family at bedside   CONSULTS    Objective    Physical Examination:   General-appears in no acute distress Heart-S1-S2, regular, no murmur auscultated Lungs-bilateral wheezing auscultated Abdomen-soft, nontender, no organomegaly Extremities-no edema in the lower extremities Neuro-alert, oriented x3, no focal deficit noted  Status is: Inpatient: [place]   [facility] If 7PM-7AM, please contact night-coverage at [email or web address], Office  [phone]   06/25/2021, 2:57 PM  LOS: 3 days

## 2021-06-25 NOTE — TOC Progression Note (Signed)
Transition of Care [facility]) - Progression Note   Patient Details  Name: Michelle Holt MRN: [id number] Date of Birth: 05-05-1970  Transition of Care [facility]) CM/SW [place], LCSW Phone Number: 06/25/2021, 11:07 AM  Clinical Narrative: Outpatient palliative services are being recommended for the patient. CSW spoke with patient regarding referral. Patient requested she speak with her oncologist's office first before agreeing to palliative services at home. CSW encouraged patient to follow up with oncologist's office. TOC to follow.  Expected Discharge Plan: Home/Self Care Barriers to Discharge: No Barriers Identified  Expected Discharge Plan and Services Expected Discharge Plan: Home/Self Care Discharge Planning Services: CM Consult Living arrangements for the past 2 months: Single Family Home  Readmission Risk Interventions     No data to display

## 2021-06-25 NOTE — Progress Notes (Signed)
Chaplain engaged in an initial visit with Ellisha.  During this time,  Chaplain engaged in [place], providing Sangita the space to share what was on her heart and mind.  Aaradhya shared about significant points of transition, grief, and trauma that have occurred in her life since 2014.  She noted significant deaths she has experienced with her mom, her husband, and her father.  Regnia stated that four months after her husband passed, her dad transitioned. 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    Cassidy also experienced major transition in her work life and employment.  She had a strenuous job that required a lot of her but was very lucrative.  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  Lakie voiced that she finds something very special in being with someone through transition. Around the time of becoming certified, Lindsay hit and she found her plans halted.  Upon finally engaging in the work of being a CNA, she discovered she had cancer.   Yesha vocalized feeling like she hasn't been able to catch her breath.  There has been one major occurrence after another for [facility]. She described herself now as being "tired" while also displaying a lot of passion in her faith.  Lizvet declares that she is still standing because of God.  She voiced that people tell her that she is "strong" all the time but that really it's because God is strong.  Chaplain could see the ways that Alyzah's faith has been a source of strength for her.  She also shared about her son getting into a life-changing car accident and the ways God has given her peace that her son will be alright and continue to progress through it all.   Myrth is at a place now of wanting to find some joy and passion in her life again even though she feels so exhausted.  Chaplain suggested that Jaylin share her story  in the forms of voice notes or videos because Chaplain recognized the ways Joelle was brought joy by sharing about her testimony and faith.  Kaysie stated that she has always wanted to write a book about her experiences.  Chaplain encouraged her to lean into that still and that there are ways to tell her story without overexerting herself.  Chaplain offered to support her in this way as needed.   Remmy expressed that it was good to have someone to just listen.  She voiced needing to have that space to just talk.  Chaplain provided compassionate listening, presence, and prayer with [place].     06/25/21 1300  Clinical Encounter Type  Visited With Patient  Visit Type Initial;Spiritual support  Referral From Palliative care team  Consult/Referral To Chaplain  Spiritual Encounters  Spiritual Needs Grief support;Emotional;Prayer  Stress Factors  Patient Stress Factors Exhausted;Major life changes;Health changes

## 2021-06-26 ENCOUNTER — Other Ambulatory Visit (HOSPITAL_COMMUNITY): Payer: Self-pay | Admitting: Hematology

## 2021-06-26 ENCOUNTER — Inpatient Hospital Stay (HOSPITAL_COMMUNITY): Payer: Medicare Other

## 2021-06-26 ENCOUNTER — Other Ambulatory Visit (HOSPITAL_COMMUNITY): Payer: Self-pay | Admitting: *Deleted

## 2021-06-26 ENCOUNTER — Encounter (HOSPITAL_COMMUNITY): Payer: Self-pay

## 2021-06-26 DIAGNOSIS — J9601 Acute respiratory failure with hypoxia: Secondary | ICD-10-CM | POA: Diagnosis not present

## 2021-06-26 DIAGNOSIS — J189 Pneumonia, unspecified organism: Secondary | ICD-10-CM | POA: Diagnosis not present

## 2021-06-26 DIAGNOSIS — Z7189 Other specified counseling: Secondary | ICD-10-CM | POA: Diagnosis not present

## 2021-06-26 DIAGNOSIS — Z515 Encounter for palliative care: Secondary | ICD-10-CM | POA: Diagnosis not present

## 2021-06-26 DIAGNOSIS — R531 Weakness: Secondary | ICD-10-CM | POA: Diagnosis not present

## 2021-06-26 DIAGNOSIS — K449 Diaphragmatic hernia without obstruction or gangrene: Secondary | ICD-10-CM | POA: Diagnosis not present

## 2021-06-26 DIAGNOSIS — J9801 Acute bronchospasm: Secondary | ICD-10-CM | POA: Diagnosis not present

## 2021-06-26 DIAGNOSIS — C563 Malignant neoplasm of bilateral ovaries: Secondary | ICD-10-CM

## 2021-06-26 LAB — CULTURE, RESPIRATORY W GRAM STAIN: Culture: NORMAL

## 2021-06-26 LAB — LEGIONELLA PNEUMOPHILA SEROGP 1 UR AG: L. pneumophila Serogp 1 Ur Ag: NEGATIVE

## 2021-06-26 MED ORDER — PREDNISONE 10 MG PO TABS: ORAL_TABLET | ORAL | 0 refills | Status: DC

## 2021-06-26 MED ORDER — GUAIFENESIN ER 600 MG PO TB12: 600.0000 mg | ORAL_TABLET | Freq: Two times a day (BID) | ORAL | 0 refills | Status: DC

## 2021-06-26 MED ORDER — FOLIC ACID 1 MG PO TABS: 1.0000 mg | ORAL_TABLET | Freq: Every day | ORAL | 1 refills | Status: DC

## 2021-06-26 MED ORDER — HEPARIN SOD (PORK) LOCK FLUSH 100 UNIT/ML IV SOLN
500.0000 [IU] | INTRAVENOUS | Status: AC | PRN
  Administered 2021-06-26: 500 [IU]
  Filled 2021-06-26: qty 5

## 2021-06-26 NOTE — Progress Notes (Signed)
Discharge instructions given to patient and all questions were answered.  

## 2021-06-26 NOTE — Care Management Important Message (Signed)
Important Message  Patient Details IM Letter given to the Patient. Name: Michelle Holt MRN: [id number] Date of Birth: 1970-06-02   Medicare Important Message Given:  Yes     Kerin Salen 06/26/2021, 10:55 AM

## 2021-06-26 NOTE — Discharge Summary (Addendum)
Physician Discharge Summary   Patient: Michelle Holt MRN: [id number] DOB: 01/03/1971  Admit date:     06/22/2021  Discharge date: 06/26/21  Discharge Physician: Oswald Hillock   PCP: [name], MD   Recommendations at discharge:   Follow-up oncology as outpatient Patient to get home-based palliative care after discharge  Discharge Diagnoses: Principal Problem:   CAP (community acquired pneumonia) Active Problems:   Acute respiratory failure with hypoxia (HCC)   Macrocytic anemia   Ovarian cancer, bilateral (HCC)   Leukopenia   Thrombocytopenia (HCC)   Elevated d-dimer   Low back pain   Hiatal hernia   Obesity (BMI 30-39.9)   Acute bronchospasm   Palliative care by specialist   Goals of care, counseling/discussion   General weakness  Resolved Problems:   * No resolved hospital problems. [facility] Course:  51 year old femalewith medical history significant of stage IVA high-grade serous ovarian cancer, low back/left upper quadrant pain, peripheral neuropathy, iron deficiency anemia who presents to the emergency department via EMS from home due to shortness of breath.  Patient had last dose of chemotherapy (carboplatin and paclitaxel) on Monday (6/5) after which she complained of some wheezes which worsened on Wednesday and presents with increased work of breathing by yesterday and EMS was activated.  On arrival of EMS team, patient was noted to be hypoxic in the 80s and supplemental oxygen at 4 LPM was provided with improved oxygenation to 97%.  She complained of several days of nonproductive cough and generalized body ache but denies chest pain, fevers,, headache, nausea, abdominal, vomiting.   CT angiography chest with contrast showed no pulmonary embolus, but showed vague patchy peribronchovascular airspace opacity/centrilobular nodules. Finding could represent infection/inflammation (such as aspiration pneumonia).   Assessment and Plan:  Acute hypoxemic respiratory  failure -Resolved -Presumed community-acquired pneumonia -CTA chest was suggestive of pneumonia -Patient started on ceftriaxone and Zithromax -Blood cultures x2 is negative to date -Strep pneumo urinary antigen is negative, urine Legionella is negative -Procalcitonin less than 0.10 -Ceftriaxone was discontinued -Patient has received 5 days of antibiotic treatment -On ambulation O2 sats remained above 95% on room air.  Does not require oxygen    Acute bronchitis -Improved with Solu-Medrol, Mucinex, Zithromax -We will discharge home on prednisone taper and Mucinex -Will not prescribe any further antibiotics -Continue albuterol inhaler as needed    Pancytopenia -Likely side effect of chemotherapy, patient received last dose on last Monday -Follow oncology as outpatient   Macrocytic anemia MCV 118.6,  -B12 320, folate 4.9 -Started on folic acid supplementation   Elevated D-dimer CT angiography of chest ruled out PE   Ovarian cancer Patient continues to follow-up with Dr. Delton Coombes (heme/Onc) Last visit was on 6/5  Low back/LUQ pain Continue oxycodone as needed  GERD Continue Protonix  Hiatal hernia Stable  Obesity (BMI 32.61) Diet and lifestyle modification]           Consultants:  Procedures performed:  Disposition: Home Diet recommendation:  Discharge Diet Orders (From admission, onward)     Start     Ordered   06/26/21 0000  Diet - low sodium heart healthy        06/26/21 1141           Regular diet DISCHARGE MEDICATION: Allergies as of 06/26/2021       Reactions   Morphine And Related Itching   Nickel Itching   Nortriptyline Other (See Comments)   Significant weight gain   Topamax [topiramate] Diarrhea, Nausea Only  Xanax [alprazolam]    Can't wake up    Actifed Cold-allergy [chlorpheniramine-phenyleph Er] Rash   Red dye only   Amoxicillin Rash   Did it involve swelling of the face/tongue/throat, SOB, or low BP? Unknown Did it  involve sudden or severe rash/hives, skin peeling, or any reaction on the inside of your mouth or nose? Unknown Did you need to seek medical attention at a hospital or doctor's office? Unknown When did it last happen? teenager       If all above answers are "NO", may proceed with cephalosporin use.   Codeine Hives   Erythromycin Rash   Penicillins Rash   Did it involve swelling of the face/tongue/throat, SOB, or low BP? Unknown Did it involve sudden or severe rash/hives, skin peeling, or any reaction on the inside of your mouth or nose? Unknown Did you need to seek medical attention at a hospital or doctor's office? Unknown When did it last happen? teenager       If all above answers are "NO", may proceed with cephalosporin use.   Sudafed [pseudoephedrine Hcl] Rash   Red dye only        Medication List     STOP taking these medications    levofloxacin 750 MG tablet Commonly known as: Levaquin       TAKE these medications    albuterol 108 (90 Base) MCG/ACT inhaler Commonly known as: VENTOLIN HFA Inhale 2 puffs into the lungs every 6 (six) hours as needed for wheezing or shortness of breath.   B-complex with vitamin C tablet Take 1 tablet by mouth daily.   dexamethasone 4 MG tablet Commonly known as: DECADRON Take 1 tablet (4 mg total) by mouth daily. Three days after each treatment   diazepam 5 MG tablet Commonly known as: VALIUM TAKE ONE TABLET BY MOUTH AT BEDTIME What changed: when to take this   dicyclomine 10 MG capsule Commonly known as: BENTYL Take 1 capsule (10 mg total) by mouth 3 (three) times daily as needed for spasms.   gabapentin 100 MG capsule Commonly known as: NEURONTIN TAKE TWO (2) CAPSULES BY MOUTH AT BEDTIME What changed: See the new instructions.   guaiFENesin 600 MG 12 hr tablet Commonly known as: MUCINEX Take 1 tablet (600 mg total) by mouth 2 (two) times daily.   lactulose 10 GM/15ML solution Commonly known as: CHRONULAC Take 15 mLs  (10 g total) by mouth as needed. 15 mls every 3 hours until bowel movement achieved, then take daily.   lidocaine-prilocaine cream Commonly known as: EMLA Apply 1 application. topically as needed (pain).   linaclotide 290 MCG Caps capsule Commonly known as: LINZESS Take 1 capsule (290 mcg total) by mouth daily before breakfast. What changed:  when to take this reasons to take this   magic mouthwash w/lidocaine Soln Take 5 mLs by mouth 3 (three) times daily as needed for mouth pain (Swish and spit).   Magnesium 500 MG Tabs Take 500 mg by mouth in the morning.   omeprazole 40 MG capsule Commonly known as: PRILOSEC Take 1 capsule (40 mg total) by mouth 2 (two) times daily before a meal.   ondansetron 4 MG tablet Commonly known as: Zofran Take 1 tablet (4 mg total) by mouth every 8 (eight) hours as needed for nausea or vomiting.   oxyCODONE HCl 30 MG Taba Take 1 tablet by mouth every 6 (six) hours as needed. What changed: reasons to take this   predniSONE 10 MG tablet Commonly known as:  DELTASONE Prednisone 40 mg po daily x 1 day then Prednisone 30 mg po daily x 1 day then Prednisone 20 mg po daily x 1 day then Prednisone 10 mg daily x 1 day then stop...   sennosides-docusate sodium 8.6-50 MG tablet Commonly known as: SENOKOT-S Take 1 tablet by mouth 2 (two) times daily.   SUMAtriptan 100 MG tablet Commonly known as: IMITREX TAKE ONE TABLET BY MOUTH PRN UP to TWICE DAILY AS NEEDED. What changed:  how much to take how to take this when to take this reasons to take this additional instructions   tiZANidine 4 MG tablet Commonly known as: Zanaflex Take 1 tablet (4 mg total) by mouth 3 (three) times daily as needed for muscle spasms. What changed: when to take this   triamcinolone cream 0.1 % Commonly known as: KENALOG Apply 1 application topically 2 (two) times daily as needed (rash).   venlafaxine XR 150 MG 24 hr capsule Commonly known as: EFFEXOR-XR TAKE ONE  CAPSULE BY MOUTH DAILY WITH BREAKFAST What changed: See the new instructions.   Vitamin D 50 MCG (2000 UT) Caps Take 2,000 Units by mouth daily.        Discharge Exam: Filed Weights   06/23/21 1914  Weight: 85.6 kg   General-appears in no acute distress Heart-S1-S2, regular, no murmur auscultated Lungs-clear to auscultation bilaterally, no wheezing or crackles auscultated Abdomen-soft, nontender, no organomegaly Extremities-no edema in the lower extremities Neuro-alert, oriented x3, no focal deficit noted  Condition at discharge: good  The results of significant diagnostics from this hospitalization (including imaging, microbiology, ancillary and laboratory) are listed below for reference.   Imaging Studies: DG Chest Port 1V same Day  Result Date: 06/26/2021 CLINICAL DATA:  Reason for exam: dyspnea  Hx of ovarian carcinoma EXAM: PORTABLE CHEST - 1 VIEW SAME DAY COMPARISON:  06/22/2021 FINDINGS: Chronic coarse bronchovascular markings with no confluent infiltrate, nodule, or overt edema. Stable right IJ power injectable port catheter. Moderate hiatal hernia.  Heart size normal. No effusion. Visualized bones unremarkable. IMPRESSION: No acute cardiopulmonary disease.  Moderate hiatal hernia. Electronically Signed   By: Lucrezia Europe M.D.   On: 06/26/2021 08:18   CT Angio Chest PE W/Cm &/Or Wo Cm  Result Date: 06/22/2021 CLINICAL DATA:  Pulmonary embolism (PE) suspected, positive D-dimer. Pt is a cancer patient with last chemo tx on Monday. Pt also has cough; was placed on 4L by EMS, O2 was then 93%. BP 112/73, no meds given en route by EMS EXAM: CT ANGIOGRAPHY CHEST WITH CONTRAST TECHNIQUE: Multidetector CT imaging of the chest was performed using the standard protocol during bolus administration of intravenous contrast. Multiplanar CT image reconstructions and MIPs were obtained to evaluate the vascular anatomy. RADIATION DOSE REDUCTION: This exam was performed according to the  departmental dose-optimization program which includes automated exposure control, adjustment of the mA and/or kV according to patient size and/or use of iterative reconstruction technique. CONTRAST:  19m OMNIPAQUE IOHEXOL 350 MG/ML SOLN COMPARISON:  None Available. FINDINGS: Cardiovascular: Satisfactory opacification of the pulmonary arteries to the segmental level. No evidence of pulmonary embolism. The main pulmonary artery is normal in caliber. Normal heart size. No significant pericardial effusion. The thoracic aorta is normal in caliber. No atherosclerotic plaque of the thoracic aorta. No coronary artery calcifications. Mediastinum/Nodes: No enlarged mediastinal, hilar, or axillary lymph nodes. Thyroid gland, trachea, and esophagus demonstrate no significant findings. Large hiatal hernia containing the majority of the stomach. No associated bowel wall thickening or pneumatosis. No dilatation of  the stomach. Lungs/Pleura: Biapical pleural/pulmonary scarring. Vague patchy peribronchovascular airspace opacity/centrilobular nodules. No pulmonary nodule. No pulmonary mass. No pleural effusion. No pneumothorax. Upper Abdomen: Vascular correlating noted along left upper abdomen. Status post cholecystectomy. Otherwise no acute abnormality. Musculoskeletal: No chest wall abnormality. No suspicious lytic or blastic osseous lesions. No acute displaced fracture. Review of the MIP images confirms the above findings. IMPRESSION: 1. No pulmonary embolus. 2. Vague patchy peribronchovascular airspace opacity/centrilobular nodules. Finding could represent infection/inflammation (such as aspiration pneumonia). Recommend short-term 3 month CT follow-up to evaluate for stability. 3. Large hiatal hernia. No findings of associated obstruction. No findings suggest ischemia. Electronically Signed   By: Iven Finn M.D.   On: 06/22/2021 23:22   DG Chest Portable 1 View  Result Date: 06/22/2021 CLINICAL DATA:  Shortness of  breath EXAM: PORTABLE CHEST 1 VIEW COMPARISON:  05/04/2021 FINDINGS: Right Port-A-Cath remains in place, unchanged. Large hiatal hernia. Heart and mediastinal contours are within normal limits. No focal opacities or effusions. No acute bony abnormality. IMPRESSION: No active cardiopulmonary disease. Large hiatal hernia. Electronically Signed   By: Rolm Baptise M.D.   On: 06/22/2021 21:12    Microbiology: Results for orders placed or performed during the hospital encounter of 06/22/21  Blood culture (routine x 2)     Status: None (Preliminary result)   Collection Time: 06/23/21  1:18 AM   Specimen: Site Not Specified; Blood  Result Value Ref Range Status   Specimen Description   Final    SITE NOT SPECIFIED BOTTLES DRAWN AEROBIC AND ANAEROBIC   Special Requests Blood Culture adequate volume  Final   Culture   Final    NO GROWTH 3 DAYS Performed at [facility], [address] [place], [place] [address]    Report Status PENDING  Incomplete  Blood culture (routine x 2)     Status: None (Preliminary result)   Collection Time: 06/23/21  1:33 AM   Specimen: Right Antecubital; Blood  Result Value Ref Range Status   Specimen Description   Final    RIGHT ANTECUBITAL BOTTLES DRAWN AEROBIC AND ANAEROBIC   Special Requests Blood Culture adequate volume  Final   Culture   Final    NO GROWTH 3 DAYS Performed at [facility], [address], [place], [place] [address]    Report Status PENDING  Incomplete  Expectorated Sputum Assessment w Gram Stain, Rflx to Resp Cult     Status: None   Collection Time: 06/23/21  9:41 AM   Specimen: Expectorated Sputum  Result Value Ref Range Status   Specimen Description EXPECTORATED SPUTUM  Final   Special Requests NONE  Final   Sputum evaluation   Final    Sputum specimen not acceptable for testing.  Please recollect.   NOTIFIED THERESA RN ON 06/23/21 @ 1135 BY GOLSONM Performed at [facility], [place] [address], [place], [place] [address]     Report Status 06/23/2021 FINAL  Final  Expectorated Sputum Assessment w Gram Stain, Rflx to Resp Cult     Status: None   Collection Time: 06/23/21  5:45 PM   Specimen: Expectorated Sputum  Result Value Ref Range Status   Specimen Description EXPECTORATED SPUTUM  Final   Special Requests NONE  Final   Sputum evaluation   Final    THIS SPECIMEN IS ACCEPTABLE FOR SPUTUM CULTURE Performed at [facility], [place] [address], [place], [place] [address]    Report Status 06/23/2021 FINAL  Final  Culture, Respiratory w Gram Stain  Status: None (Preliminary result)   Collection Time: 06/23/21  5:45 PM  Result Value Ref Range Status   Specimen Description   Final    EXPECTORATED SPUTUM Performed at [place] [address], [place], [place] [address]    Special Requests   Final    NONE Reflexed from [id number] Performed at [facility], [place] [address] [place], [place] [address]    Gram Stain   Final    FEW WBC PRESENT, PREDOMINANTLY MONONUCLEAR FEW GRAM POSITIVE COCCI IN CLUSTERS FEW GRAM NEGATIVE RODS FEW GRAM POSITIVE RODS    Culture   Final    CULTURE REINCUBATED FOR BETTER GROWTH Performed at [facility], [place] [address] [place], [place] [address]    Report Status PENDING  Incomplete    Labs: CBC: Recent Labs  Lab 06/22/21 2041 06/23/21 1452 06/24/21 0330 06/25/21 0305  WBC 3.5* 3.5* 2.7* 3.3*  NEUTROABS 2.5  --   --   --   HGB 11.3* 9.9* 9.9* 8.7*  HCT 33.2* 30.5* 30.2* 26.3*  MCV 118.6* 124.0* 123.3* 124.1*  PLT 98* 87* 82* 71*   Basic Metabolic Panel: Recent Labs  Lab 06/22/21 2041 06/23/21 1452 06/24/21 0330  NA 135 138 139  K 4.7 4.0 4.5  CL 101 101 106  CO2 '28 24 24  '$ GLUCOSE 107* 156* 208*  BUN '14 19 19  '$ CREATININE 0.59 0.63 0.61  CALCIUM 9.6 9.6 9.3  MG  --  1.6*  --   PHOS  --  3.0  --    Liver Function Tests: Recent Labs  Lab 06/22/21 2041 06/23/21 1452 06/24/21 0330  AST '18 17  18  '$ ALT '21 16 17  '$ ALKPHOS 87 79 77  BILITOT 0.7 0.5 0.5  PROT 7.5 7.1 6.9  ALBUMIN 4.0 3.8 3.8   CBG: No results for input(s): "GLUCAP" in the last 168 hours.  Discharge time spent: greater than 30 minutes.  Signed: Oswald Hillock, MD [facility] 06/26/2021

## 2021-06-26 NOTE — Progress Notes (Signed)
Patient ambulated 32f on room air and her oxygen level remained above 95%.

## 2021-06-26 NOTE — TOC Transition Note (Signed)
Transition of Care [facility]) - CM/SW Discharge Note  Patient Details  Name: Michelle Holt MRN: [id number] Date of Birth: 24-Nov-1970  [facility]) CM/SW Contact:  Sherie Don, LCSW Phone Number: 06/26/2021, 12:23 PM  Clinical Narrative: CSW followed up with patient regarding OP palliative referral. CSW informed patient of OP agencies in [place]. Per patient, she would prefer to discuss whether she wants to receive palliative services or not when she sees her oncologist next month.  TOC signing off.  Final next level of care: Home/Self Care Barriers to Discharge: No Barriers Identified  Patient Goals and CMS Choice Patient states their goals for this hospitalization and ongoing recovery are:: Return home CMS Medicare.gov Compare Post Acute Care list provided to:: Patient Choice offered to / list presented to : NA  Discharge Plan and Services Discharge Planning Services: CM Consult    DME Arranged: N/A DME Agency: NA  Readmission Risk Interventions     No data to display

## 2021-06-26 NOTE — Progress Notes (Signed)
PT Cancellation Note  Patient Details Name: Michelle Holt MRN: [id number] DOB: 1970/01/31   Cancelled Treatment:    Reason Eval/Treat Not Completed: PT screened, no needs identified, will sign off    Doreatha Massed, PT Acute Rehabilitation  Office: [phone] Pager: [phone]

## 2021-06-26 NOTE — Progress Notes (Signed)
Daily Progress Note   Patient Name: Michelle Holt       Date: 06/26/2021 DOB: [birth year]  Age: 51 y.o. MRN#: [id number] Attending Physician: Oswald Hillock, MD Primary Care Physician: [name], MD Admit Date: 06/22/2021  Reason for Consultation/Follow-up: Establishing goals of care  Subjective:  Awake alert sitting in chair, finished breakfast, she is passing gas, hopes to have bowel movement today. Denies pain, just feels tired.     Length of Stay: 4  Current Medications: Scheduled Meds:   B-complex with vitamin C  1 tablet Oral Daily   Chlorhexidine Gluconate Cloth  6 each Topical Daily   cholecalciferol  2,000 Units Oral Daily   enoxaparin (LOVENOX) injection  40 mg Subcutaneous L27N   folic acid  1 mg Oral Daily   guaiFENesin  1,200 mg Oral BID   ipratropium-albuterol  3 mL Nebulization Q6H   lactulose  10 g Oral Q3H   methylPREDNISolone (SOLU-MEDROL) injection  40 mg Intravenous Q12H   pantoprazole  40 mg Oral Daily   sodium chloride flush  10-40 mL Intracatheter Q12H   venlafaxine XR  150 mg Oral Q breakfast    Continuous Infusions:  azithromycin 500 mg (06/26/21 0126)    PRN Meds: acetaminophen, albuterol, HYDROmorphone (DILAUDID) injection, ipratropium-albuterol, linaclotide, LORazepam, oxyCODONE, phenol, senna-docusate, sodium chloride flush  Physical Exam         Awake alert  Resting in chair.  Regular work of breathing S 1 S 2 Has some LE edema No focal deficits.   Vital Signs: BP 111/65 (BP Location: Left Arm)   Pulse 81   Temp 98.3 F (36.8 C) (Oral)   Resp 16   Ht '5\' 4"'$  (1.626 m)   Wt 85.6 kg   LMP  (LMP Unknown)   SpO2 98%   BMI 32.39 kg/m  SpO2: SpO2: 98 % O2 Device: O2 Device: Nasal Cannula O2 Flow Rate: O2 Flow Rate (L/min): 2  L/min  Intake/output summary:  Intake/Output Summary (Last 24 hours) at 06/26/2021 0951 Last data filed at 06/26/2021 0600 Gross per 24 hour  Intake 729.83 ml  Output --  Net 729.83 ml    LBM: Last BM Date : 06/22/21 Baseline Weight: Weight: 85.6 kg Most recent weight: Weight: 85.6 kg       Palliative Assessment/Data:  Patient Active Problem List   Diagnosis Date Noted   Palliative care by specialist    Goals of care, counseling/discussion    General weakness    Leukopenia 06/23/2021   Thrombocytopenia (HCC) 06/23/2021   Elevated d-dimer 06/23/2021   Low back pain 06/23/2021   Hiatal hernia 06/23/2021   Obesity (BMI 30-39.9) 06/23/2021   Acute bronchospasm 06/23/2021   CAP (community acquired pneumonia) 06/22/2021   GERD (gastroesophageal reflux disease)    Melena 11/07/2020   Iron deficiency anemia 11/07/2020   Post-op pain 07/12/2020   Splenic lesion 07/12/2020   Status post total replacement of left hip 06/05/2020   Primary osteoarthritis of left hip 02/09/2020   Pain in right knee 02/09/2020   Palliative care patient 09/18/2018   Port-A-Cath in place 08/20/2018   Genetic testing 08/20/2018   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[place]) 08/17/2018   Splenic laceration 07/31/2018   Family history of uterine cancer    Family history of breast cancer    Macrocytic anemia 07/18/2018   Upper GI bleeding 07/18/2018   Ovarian cancer, bilateral ([place]) 07/18/2018   HCAP (healthcare-associated pneumonia) 07/18/2018   Pleural effusion 07/07/2018   Migraine 07/06/2018   Pleural effusion on left 07/06/2018   Acute respiratory failure with hypoxia (HCC) 07/06/2018   Tobacco abuse 07/06/2018   Generalized anxiety disorder 05/02/2014   Unspecified constipation 08/10/2013   Rectal bleeding 08/10/2013   Lumbosacral spondylosis without myelopathy 10/23/2011   Intractable migraine without aura 10/23/2011    Palliative Care Assessment & Plan   Patient  Profile:    Assessment:  51 year old femalewith medical history significant of stage IVA high-grade serous ovarian cancer, low back/left upper quadrant pain, peripheral neuropathy, iron deficiency anemia who presents to the emergency department via EMS from home due to shortness of breath.  Patient had last dose of chemotherapy (carboplatin and paclitaxel) on Monday (6/5) after which she complained of some wheezes which worsened on Wednesday and presents with increased work of breathing by yesterday and EMS was activated.  On arrival of EMS team, patient was noted to be hypoxic in the 80s and supplemental oxygen at 4 LPM was provided with improved oxygenation to 97%.  She complained of several days of nonproductive cough and generalized body ache but denies chest pain, fevers,, headache, nausea, abdominal, vomiting.   CT angiography chest with contrast showed no pulmonary embolus, but showed vague patchy peribronchovascular airspace opacity/centrilobular nodules. Finding could represent infection/inflammation (such as aspiration pneumonia).   Recommendations/Plan: Remains admitted to hospital medicine service, on antibiotics for aspiration PNA. Discussed with patient about her current condition and her goals of care. Pain and non pain symptom management options discussed with patient, in particular, her anxiety management options were discussed. Offered her active listening and supportive care.   Full code, full scope. Consider outpatient palliative. No further PMT specific recommendations at this time. Patient's bowel regimen resumed, she is agreeable to home based palliative care.   Goals of Care and Additional Recommendations: Limitations on Scope of Treatment: Full Scope Treatment  Code Status:    Code Status Orders  (From admission, onward)           Start     Ordered   06/23/21 0039  Full code  Continuous        06/23/21 0038           Code Status History     Date Active  Date Inactive Code Status Order ID Comments User Context   06/05/2020 1607  06/06/2020 1706 Full Code [id number]  Leandrew Koyanagi, MD Inpatient   12/29/2018 0532 12/31/2018 0315 Full Code [id number]  Dorothyann Gibbs, NP Inpatient   07/31/2018 2026 08/04/2018 1933 Full Code [id number]  Jani Gravel, MD ED   07/18/2018 2247 07/21/2018 1847 Full Code [id number]  Truett Mainland, DO Inpatient   07/06/2018 2208 07/16/2018 2116 Full Code [id number]  Ivor Costa, MD ED       Prognosis:  Unable to determine  Discharge Planning: Home with palliative.   Care plan was discussed with  patient   And Dr Darrick Meigs.   Thank you for allowing the Palliative Medicine Team to assist in the care of this patient.   Time In: 9 Time Out: 9.25 Total Time 25 Prolonged Time Billed  no       Greater than 50%  of this time was spent counseling and coordinating care related to the above assessment and plan.  Loistine Chance, MD  Please contact Palliative Medicine Team phone at [phone] for questions and concerns.

## 2021-06-27 ENCOUNTER — Other Ambulatory Visit (HOSPITAL_COMMUNITY): Payer: Self-pay | Admitting: *Deleted

## 2021-06-27 MED ORDER — OXYCODONE HCL 30 MG PO TABS
30.0000 mg | ORAL_TABLET | Freq: Four times a day (QID) | ORAL | 0 refills | Status: DC | PRN
Start: 2021-06-27 — End: 2021-07-24

## 2021-06-28 LAB — CULTURE, BLOOD (ROUTINE X 2)
Culture: NO GROWTH
Culture: NO GROWTH
Special Requests: ADEQUATE
Special Requests: ADEQUATE

## 2021-07-06 ENCOUNTER — Ambulatory Visit: Payer: Medicare Other | Admitting: Urology

## 2021-07-11 ENCOUNTER — Inpatient Hospital Stay (HOSPITAL_COMMUNITY): Payer: Medicare Other

## 2021-07-11 ENCOUNTER — Encounter (HOSPITAL_COMMUNITY): Payer: Self-pay

## 2021-07-11 VITALS — BP 130/66 | HR 93 | Temp 97.9°F | Resp 18

## 2021-07-11 DIAGNOSIS — Z95828 Presence of other vascular implants and grafts: Secondary | ICD-10-CM

## 2021-07-11 DIAGNOSIS — C563 Malignant neoplasm of bilateral ovaries: Secondary | ICD-10-CM | POA: Diagnosis present

## 2021-07-11 DIAGNOSIS — Z90710 Acquired absence of both cervix and uterus: Secondary | ICD-10-CM | POA: Diagnosis not present

## 2021-07-11 DIAGNOSIS — Z5111 Encounter for antineoplastic chemotherapy: Secondary | ICD-10-CM | POA: Diagnosis present

## 2021-07-11 DIAGNOSIS — Z87891 Personal history of nicotine dependence: Secondary | ICD-10-CM | POA: Diagnosis not present

## 2021-07-11 DIAGNOSIS — E611 Iron deficiency: Secondary | ICD-10-CM | POA: Diagnosis present

## 2021-07-11 DIAGNOSIS — R1012 Left upper quadrant pain: Secondary | ICD-10-CM | POA: Diagnosis not present

## 2021-07-11 DIAGNOSIS — K5900 Constipation, unspecified: Secondary | ICD-10-CM | POA: Diagnosis not present

## 2021-07-11 DIAGNOSIS — G629 Polyneuropathy, unspecified: Secondary | ICD-10-CM | POA: Diagnosis not present

## 2021-07-11 DIAGNOSIS — Z90721 Acquired absence of ovaries, unilateral: Secondary | ICD-10-CM | POA: Diagnosis not present

## 2021-07-11 DIAGNOSIS — Z9079 Acquired absence of other genital organ(s): Secondary | ICD-10-CM | POA: Diagnosis not present

## 2021-07-11 LAB — COMPREHENSIVE METABOLIC PANEL
ALT: 24 U/L (ref 0–44)
AST: 24 U/L (ref 15–41)
Albumin: 3.4 g/dL — ABNORMAL LOW (ref 3.5–5.0)
Alkaline Phosphatase: 96 U/L (ref 38–126)
Anion gap: 11 (ref 5–15)
BUN: 8 mg/dL (ref 6–20)
CO2: 25 mmol/L (ref 22–32)
Calcium: 8.8 mg/dL — ABNORMAL LOW (ref 8.9–10.3)
Chloride: 99 mmol/L (ref 98–111)
Creatinine, Ser: 0.68 mg/dL (ref 0.44–1.00)
GFR, Estimated: >60 mL/min (ref >60)
Glucose, Bld: 149 mg/dL — ABNORMAL HIGH (ref 70–99)
Potassium: 3.7 mmol/L (ref 3.5–5.1)
Sodium: 135 mmol/L (ref 135–145)
Total Bilirubin: 0.4 mg/dL (ref 0.3–1.2)
Total Protein: 6.6 g/dL (ref 6.5–8.1)

## 2021-07-11 LAB — CBC WITH DIFFERENTIAL/PLATELET
Abs Immature Granulocytes: 0.02 10*3/uL (ref 0.00–0.07)
Basophils Absolute: 0 10*3/uL (ref 0.0–0.1)
Basophils Relative: 1 %
Eosinophils Absolute: 0 10*3/uL (ref 0.0–0.5)
Eosinophils Relative: 0 %
HCT: 28.1 % — ABNORMAL LOW (ref 36.0–46.0)
Hemoglobin: 9.3 g/dL — ABNORMAL LOW (ref 12.0–15.0)
Immature Granulocytes: 1 %
Lymphocytes Relative: 47 %
Lymphs Abs: 1.9 10*3/uL (ref 0.7–4.0)
MCH: 42.3 pg — ABNORMAL HIGH (ref 26.0–34.0)
MCHC: 33.1 g/dL (ref 30.0–36.0)
MCV: 127.7 fL — ABNORMAL HIGH (ref 80.0–100.0)
Monocytes Absolute: 0.4 10*3/uL (ref 0.1–1.0)
Monocytes Relative: 10 %
Neutro Abs: 1.7 10*3/uL (ref 1.7–7.7)
Neutrophils Relative %: 41 %
Platelets: 142 10*3/uL — ABNORMAL LOW (ref 150–400)
RBC: 2.2 MIL/uL — ABNORMAL LOW (ref 3.87–5.11)
RDW: 15.7 % — ABNORMAL HIGH (ref 11.5–15.5)
WBC: 4 10*3/uL (ref 4.0–10.5)
nRBC: 0.5 % — ABNORMAL HIGH (ref 0.0–0.2)

## 2021-07-11 LAB — MAGNESIUM: Magnesium: 1.6 mg/dL — ABNORMAL LOW (ref 1.7–2.4)

## 2021-07-11 MED ORDER — SODIUM CHLORIDE 0.9% FLUSH
10.0000 mL | Freq: Once | INTRAVENOUS | Status: AC
  Administered 2021-07-11: 10 mL via INTRAVENOUS

## 2021-07-11 MED ORDER — HEPARIN SOD (PORK) LOCK FLUSH 100 UNIT/ML IV SOLN
500.0000 [IU] | Freq: Once | INTRAVENOUS | Status: AC
  Administered 2021-07-11: 500 [IU] via INTRAVENOUS

## 2021-07-11 NOTE — Patient Instructions (Signed)
[facility]  Discharge Instructions: Thank you for choosing [facility] to provide your oncology and hematology care.  If you have a lab appointment with the [facility], please come in thru the Main Entrance and check in at the main information desk.  Wear comfortable clothing and clothing appropriate for easy access to any Portacath or PICC line.   We strive to give you quality time with your provider. You may need to reschedule your appointment if you arrive late (15 or more minutes).  Arriving late affects you and other patients whose appointments are after yours.  Also, if you miss three or more appointments without notifying the office, you may be dismissed from the clinic at the provider's discretion.      For prescription refill requests, have your pharmacy contact our office and allow 72 hours for refills to be completed.    Today you received the following port flush and lab draw. Return as scheduled.   To help prevent nausea and vomiting after your treatment, we encourage you to take your nausea medication as directed.  BELOW ARE SYMPTOMS THAT SHOULD BE REPORTED IMMEDIATELY: *FEVER GREATER THAN 100.4 F (38 C) OR HIGHER *CHILLS OR SWEATING *NAUSEA AND VOMITING THAT IS NOT CONTROLLED WITH YOUR NAUSEA MEDICATION *UNUSUAL SHORTNESS OF BREATH *UNUSUAL BRUISING OR BLEEDING *URINARY PROBLEMS (pain or burning when urinating, or frequent urination) *BOWEL PROBLEMS (unusual diarrhea, constipation, pain near the anus) TENDERNESS IN MOUTH AND THROAT WITH OR WITHOUT PRESENCE OF ULCERS (sore throat, sores in mouth, or a toothache) UNUSUAL RASH, SWELLING OR PAIN  UNUSUAL VAGINAL DISCHARGE OR ITCHING   Items with * indicate a potential emergency and should be followed up as soon as possible or go to the Emergency Department if any problems should occur.  Please show the CHEMOTHERAPY ALERT CARD or IMMUNOTHERAPY ALERT CARD at check-in to the Emergency Department and  triage nurse.  Should you have questions after your visit or need to cancel or reschedule your appointment, please contact [facility] [phone]  and follow the prompts.  Office hours are 8:00 a.m. to 4:30 p.m. Monday - Friday. Please note that voicemails left after 4:00 p.m. may not be returned until the following business day.  We are closed weekends and major holidays. You have access to a nurse at all times for urgent questions. Please call the main number to the clinic [phone] and follow the prompts.  For any non-urgent questions, you may also contact your provider using MyChart. We now offer e-Visits for anyone 54 and older to request care online for non-urgent symptoms. For details visit [email or web address].   Also download the MyChart app! Go to the app store, search "MyChart", open the app, select [facility], and log in with your MyChart username and password.  Masks are optional in the cancer centers. If you would like for your care team to wear a mask while they are taking care of you, please let them know. For doctor visits, patients may have with them one support person who is at least 52 years old. At this time, visitors are not allowed in the infusion area.

## 2021-07-11 NOTE — Progress Notes (Signed)
Port flushed with good blood return noted. No bruising or swelling at site. Bandaid applied and patient discharged in satisfactory condition. VVS stable with no signs or symptoms of distressed noted. 

## 2021-07-12 ENCOUNTER — Ambulatory Visit (HOSPITAL_COMMUNITY)
Admission: RE | Admit: 2021-07-12 | Discharge: 2021-07-12 | Disposition: A | Discharge status: Home / Self Care | Payer: Medicare Other | Source: Ambulatory Visit | Attending: Hematology | Admitting: Hematology

## 2021-07-12 DIAGNOSIS — C563 Malignant neoplasm of bilateral ovaries: Secondary | ICD-10-CM | POA: Diagnosis present

## 2021-07-12 LAB — CA 125: Cancer Antigen (CA) 125: 12.2 U/mL (ref 0.0–38.1)

## 2021-07-12 MED ORDER — HEPARIN SOD (PORK) LOCK FLUSH 100 UNIT/ML IV SOLN
INTRAVENOUS | Status: AC
  Filled 2021-07-12: qty 5

## 2021-07-12 MED ORDER — HEPARIN SOD (PORK) LOCK FLUSH 100 UNIT/ML IV SOLN
500.0000 [IU] | Freq: Once | INTRAVENOUS | Status: AC
  Administered 2021-07-12: 500 [IU] via INTRAVENOUS

## 2021-07-12 MED ORDER — SODIUM CHLORIDE (PF) 0.9 % IJ SOLN
INTRAMUSCULAR | Status: AC
  Filled 2021-07-12: qty 50

## 2021-07-12 MED ORDER — IOHEXOL 300 MG/ML  SOLN
100.0000 mL | Freq: Once | INTRAMUSCULAR | Status: AC | PRN
  Administered 2021-07-12: 100 mL via INTRAVENOUS

## 2021-07-18 ENCOUNTER — Inpatient Hospital Stay (HOSPITAL_COMMUNITY): Payer: Medicare Other | Attending: Hematology | Admitting: Hematology

## 2021-07-18 ENCOUNTER — Encounter (HOSPITAL_COMMUNITY): Payer: Self-pay | Admitting: Hematology

## 2021-07-18 VITALS — BP 131/82 | HR 90 | Temp 98.2°F | Resp 16 | Wt 190.0 lb

## 2021-07-18 DIAGNOSIS — M545 Low back pain, unspecified: Secondary | ICD-10-CM | POA: Insufficient documentation

## 2021-07-18 DIAGNOSIS — R1012 Left upper quadrant pain: Secondary | ICD-10-CM | POA: Diagnosis not present

## 2021-07-18 DIAGNOSIS — C786 Secondary malignant neoplasm of retroperitoneum and peritoneum: Secondary | ICD-10-CM | POA: Diagnosis present

## 2021-07-18 DIAGNOSIS — D539 Nutritional anemia, unspecified: Secondary | ICD-10-CM | POA: Diagnosis not present

## 2021-07-18 DIAGNOSIS — Z90710 Acquired absence of both cervix and uterus: Secondary | ICD-10-CM | POA: Insufficient documentation

## 2021-07-18 DIAGNOSIS — K5900 Constipation, unspecified: Secondary | ICD-10-CM | POA: Insufficient documentation

## 2021-07-18 DIAGNOSIS — C563 Malignant neoplasm of bilateral ovaries: Secondary | ICD-10-CM | POA: Diagnosis present

## 2021-07-18 DIAGNOSIS — Z9221 Personal history of antineoplastic chemotherapy: Secondary | ICD-10-CM | POA: Insufficient documentation

## 2021-07-18 DIAGNOSIS — Z90722 Acquired absence of ovaries, bilateral: Secondary | ICD-10-CM | POA: Insufficient documentation

## 2021-07-18 DIAGNOSIS — D649 Anemia, unspecified: Secondary | ICD-10-CM

## 2021-07-18 DIAGNOSIS — Z9079 Acquired absence of other genital organ(s): Secondary | ICD-10-CM | POA: Insufficient documentation

## 2021-07-18 DIAGNOSIS — Z452 Encounter for adjustment and management of vascular access device: Secondary | ICD-10-CM | POA: Diagnosis not present

## 2021-07-18 NOTE — Patient Instructions (Addendum)
[place] at [facility] Discharge Instructions  You were seen and examined today by Dr. Delton Coombes.  Dr. Delton Coombes discussed your most recent lab work and CT scan which revealed no cancer. This is great news.  Dr. Delton Coombes has discussed a pill (olaparib) to be taken twice daily with the goal of preventing future cancer recurrence. The most common side effect of the medication is fatigue.  Dr. Delton Coombes will see you again 4 weeks with labs one week prior.  Follow-up as scheduled.  Thank you for choosing [place] at [facility] to provide your oncology and hematology care.  To afford each patient quality time with our provider, please arrive at least 15 minutes before your scheduled appointment time.   If you have a lab appointment with the [facility] please come in thru the Main Entrance and check in at the main information desk.  You need to re-schedule your appointment should you arrive 10 or more minutes late.  We strive to give you quality time with our providers, and arriving late affects you and other patients whose appointments are after yours.  Also, if you no show three or more times for appointments you may be dismissed from the clinic at the providers discretion.     Again, thank you for choosing [facility].  Our hope is that these requests will decrease the amount of time that you wait before being seen by our physicians.       _____________________________________________________________  Should you have questions after your visit to [facility], please contact our office at [phone] and follow the prompts.  Our office hours are 8:00 a.m. and 4:30 p.m. Monday - Friday.  Please note that voicemails left after 4:00 p.m. may not be returned until the following business day.  We are closed weekends and major holidays.  You do have access to a nurse 24-7, just call the main number to the clinic  [phone] and do not press any options, hold on the line and a nurse will answer the phone.    For prescription refill requests, have your pharmacy contact our office and allow 72 hours.

## 2021-07-18 NOTE — Progress Notes (Signed)
Michelle Holt,  [id number]   CLINIC:  Medical Oncology/Hematology  PCP:  Michelle Chroman, MD [address] / [place] [address] [phone]   REASON FOR VISIT:  Follow-up for high-grade serous ovarian cancer  PRIOR THERAPY:  1. Carboplatin and paclitaxel x 7 cycles from 08/24/2018 to 03/15/2019. 2. Laparoscopic TAH & BSO & omenectomy on 12/24/2018. 3.  6 cycles of carboplatin and paclitaxel from 03/05/2021 to 06/18/2021  NGS Results: not done  CURRENT THERAPY: To initiate olaparib  BRIEF ONCOLOGIC HISTORY:  Oncology History  Ovarian cancer, bilateral ([place])  07/07/2018 Pathology Results   PLEURAL FLUID, LEFT (SPECIMEN 1 OF 1, COLLECTED 07/07/18): - MALIGNANT CELLS CONSISTENT WITH ADENOCARCINOMA - SEE COMMENT  Source Pleural Fluid, (Specimen 1 of 1, collected on 07/07/2018) Gross Specimen: Received is/are 1000cc of bloody red fluid with tissue. (TC:tc) Prepared: # Smears: 0 # Concentration Technique Slides (i.e. ThinPrep): 1 # Cell Block: 1 Conventional Additional Studies: Two Hematology slides labeled [id number] Comment The malignant cells are positive for cytokeratin 7, p53, WT-1, Pax-8, Moc31, ER (weak) and EMA but negative for cytokeratin 20, TTF-1, GATA-3, CDX-2 and D2-40. Overall, the phenotype is consistent with a gynecologic primary; clinical correlation recommended.   07/07/2018 Procedure   Successful ultrasound guided left thoracentesis yielding 2.0 L of pleural fluid   07/08/2018 Procedure   1. Technically successful placement of left 14 French pigtail chest drain, placed to Pleur-evac water-seal.   07/08/2018 Procedure   1. Technically successful five [place] double lumen power injectable PICC placement   07/09/2018 Imaging   Ct chest 1. There is a moderate, loculated left hydropneumothorax with a small air component and moderate fluid component. The largest loculated component is located posteriorly. There is a pigtail drainage  catheter about the lateral pleural space. There is no obvious etiology, such as obvious mass or pleural disease.   2. There is a small right pleural effusion with associated atelectasis or consolidation and a subpleural consolidation of the superior segment right lower lobe (series 4, image 56), of uncertain significance, possibly infectious or inflammatory   07/10/2018 Imaging   Ct abdomen and pelvis: 1. The bilateral ovaries are enlarged by heterogeneous appearing cystic lesions, measuring 5.3 x 4.2 cm on the right (series 4, image 72) and 4.5 x 3.2 cm on the left (series 4, image 75). Consider dedicated pelvic ultrasound and/or pelvic MRI to further evaluate for solid components given high suspicion for GYN primary malignancy.   2. No other evidence of mass and no lymphadenopathy in the abdomen or pelvis.   3. Trace ascites. There is some suggestion of omental and peritoneal nodularity (e.g. Series 4, image 35), concerning for peritoneal metastatic disease.    4. Loculated left-sided pleural effusion with left-sided pleural drainage catheter in position. Small right pleural effusion   07/13/2018 Surgery   OPERATION: 1.  Left VATS (video-assisted thoracoscopic surgery) for drainage of loculated pleural effusion. 2.  Talc pleurodesis for malignant pleural effusion. 3.  Placement of PleurX catheter for management of malignant pleural effusion. 4.  Placement of On-Q analgesia catheter system.    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   07/13/2018 Pathology Results   Pleura, peel, Left Pleural - FIBRO-FIBRINOUS PLEURITIS - NEGATIVE FOR MALIGNANCY   07/18/2018 Initial Diagnosis   Ovarian cancer, bilateral ([place])  07/20/2018 Procedure   EGD impression: Normal proximal esophagus and mid esophagus. Mild  distal esophageal rings; dilation not performed because of esophagitis. LA Grade C reflux esophagitis. Z-line regular, 30 cm from the incisors. 5 cm hiatal hernia. Non-bleeding gastric ulcer with no stigmata of bleeding. Gastritis. Duodenal erosions without bleeding. Normal second portion of the duodenum. No specimens collected.   08/19/2018 Genetic Testing   Negative genetic testing on the common hereditary cancer panel.  The Common Hereditary Gene Panel offered by [facility] includes sequencing and/or deletion duplication testing of the following 48 genes: APC, ATM, AXIN2, BARD1, BMPR1A, BRCA1, BRCA2, BRIP1, CDH1, CDK4, CDKN2A (p14ARF), CDKN2A (p16INK4a), CHEK2, CTNNA1, DICER1, EPCAM (Deletion/duplication testing only), GREM1 (promoter region deletion/duplication testing only), KIT, MEN1, MLH1, MSH2, MSH3, MSH6, MUTYH, NBN, NF1, NHTL1, PALB2, PDGFRA, PMS2, POLD1, POLE, PTEN, RAD50, RAD51C, RAD51D, RNF43, SDHB, SDHC, SDHD, SMAD4, SMARCA4. STK11, TP53, TSC1, TSC2, and VHL.  The following genes were evaluated for sequence changes only: SDHA and HOXB13 c.251G>A variant only. The report date is August 19, 2018.   08/24/2018 - 03/15/2019 Chemotherapy   Patient is on Treatment Plan : OVARIAN Carboplatin (AUC 6) / Paclitaxel (175) q21d x 6 cycles     03/05/2021 -  Chemotherapy   Patient is on Treatment Plan : OVARIAN Carboplatin (AUC 6) / Paclitaxel (175) q21d x 6 cycles       CANCER STAGING: Cancer Staging  No matching staging information was found for the patient.  INTERVAL HISTORY:  [name], a 51 y.o. female, returns for routine follow-up of her high-grade serous ovarian cancer. Michelle Holt was last seen on 06/18/2021.   Today she reports feeling well. She reports severe fatigue. She is taking folic acid.   REVIEW OF SYSTEMS:  Review of Systems  Constitutional:  Positive for fatigue. Negative for appetite change.  Respiratory:  Positive for shortness of breath.   Musculoskeletal:  Positive  for arthralgias (6/10 all over).  All other systems reviewed and are negative.   PAST MEDICAL/SURGICAL HISTORY:  Past Medical History:  Diagnosis Date   Anemia    Anxiety and depression    Arthritis of facet joints at multiple vertebral levels    L5-S1   Constipation    Dyslipidemia    Family history of breast cancer    Family history of uterine cancer    GERD (gastroesophageal reflux disease)    History of hiatal hernia    History of kidney stones    Insomnia    Irritable bowel syndrome    Migraine    Muscle tension headache    Neuropathy of finger    Ovarian carcinoma (HCC)    ovarian   Plantar fasciitis of right foot    Port-A-Cath in place 08/20/2018   Past Surgical History:  Procedure Laterality Date   ABDOMINAL HYSTERECTOMY     BIOPSY  10/27/2020   Procedure: BIOPSY;  Surgeon: Harvel Quale, MD;  [facility];  Service: Gastroenterology;;   CHOLECYSTECTOMY  2008   COLONOSCOPY N/A 08/13/2013   Procedure: COLONOSCOPY;  Surgeon: Rogene Houston, MD;  [facility];  Service: Endoscopy;  Laterality: N/A;  230-moved to 145 Ann to notify pt   COLONOSCOPY WITH PROPOFOL N/A 11/15/2020   Procedure: COLONOSCOPY WITH PROPOFOL;  Surgeon: Rogene Houston, MD;  [facility];  Service: Endoscopy;  Laterality: N/A;  1:40   ESOPHAGEAL DILATION N/A 02/28/2021   Procedure: ESOPHAGEAL DILATION;  Surgeon: Rogene Houston, MD;  [facility];  Service: Endoscopy;  Laterality: N/A;   ESOPHAGOGASTRODUODENOSCOPY     ESOPHAGOGASTRODUODENOSCOPY (EGD) WITH PROPOFOL N/A 07/20/2018   Procedure: ESOPHAGOGASTRODUODENOSCOPY (EGD) WITH PROPOFOL;  Surgeon: Rogene Houston, MD;  [facility];  Service: Endoscopy;  Laterality: N/A;  Possible esophageal dilation.   ESOPHAGOGASTRODUODENOSCOPY (EGD) WITH PROPOFOL N/A 10/27/2020   Procedure: ESOPHAGOGASTRODUODENOSCOPY (EGD) WITH PROPOFOL;  Surgeon: Harvel Quale, MD;  [facility];  Service: Gastroenterology;  Laterality: N/A;  2:10, pt knows to arrive at 10:15   ESOPHAGOGASTRODUODENOSCOPY (EGD) WITH PROPOFOL N/A 02/28/2021   Procedure: ESOPHAGOGASTRODUODENOSCOPY (EGD) WITH PROPOFOL;  Surgeon: Rogene Houston, MD;  [facility];  Service: Endoscopy;  Laterality: N/A;  200 ASA 1   GIVENS CAPSULE STUDY N/A 11/24/2020   Procedure: GIVENS CAPSULE STUDY;  Surgeon: Rogene Houston, MD;  [facility];  Service: Endoscopy;  Laterality: N/A;  7:30   IR ANGIOGRAM SELECTIVE EACH ADDITIONAL VESSEL  08/01/2018   IR ANGIOGRAM VISCERAL SELECTIVE  08/01/2018   IR EMBO ART  VEN HEMORR LYMPH EXTRAV  INC GUIDE ROADMAPPING  08/01/2018   IR IMAGING GUIDED PORT INSERTION  08/20/2018   IR PERC PLEURAL DRAIN W/INDWELL CATH W/IMG GUIDE  07/08/2018   IR THORACENTESIS ASP PLEURAL SPACE W/IMG GUIDE  07/07/2018   IR US GUIDE VASC ACCESS RIGHT  08/01/2018   PLEURAL EFFUSION DRAINAGE Left 07/13/2018   Procedure: DRAINAGE OF LOCULATED PLEURAL EFFUSION;  Surgeon: Ivin Poot, MD;  Location: [place];  Service: Thoracic;  Laterality: Left;   POLYPECTOMY  11/15/2020   Procedure: POLYPECTOMY;  Surgeon: Rogene Houston, MD;  [facility];  Service: Endoscopy;;   REMOVAL OF PLEURAL DRAINAGE CATHETER Left 08/20/2018   Procedure: REMOVAL OF PLEURAL DRAINAGE CATHETER;  Surgeon: Ivin Poot, MD;  Location: [place];  Service: Thoracic;  Laterality: Left;   REMOVAL OF PLEURAL DRAINAGE CATHETER Left 08/20/2018   Procedure: REMOVAL OF PLEURAL DRAINAGE CATHETER;  Surgeon: Ivin Poot, MD;  Location: [place];  Service: Thoracic;  Laterality: Left;   TALC PLEURODESIS Left 07/13/2018   Procedure: Talc Pleuradesis;  Surgeon: Prescott Gum, Collier Salina, MD;  Location: [place];  Service: Thoracic;  Laterality: Left;   TOTAL HIP ARTHROPLASTY Left 06/05/2020   Procedure: LEFT TOTAL HIP ARTHROPLASTY ANTERIOR APPROACH;  Surgeon: Leandrew Koyanagi, MD;  Location: [place];  Service: Orthopedics;  Laterality:  Left;  3-C   TUBAL LIGATION Bilateral    UTERINE ABLATION     VIDEO ASSISTED THORACOSCOPY Left 07/13/2018   Procedure: VIDEO ASSISTED THORACOSCOPY;  Surgeon: Ivin Poot, MD;  Location: [facility];  Service: Thoracic;  Laterality: Left;    SOCIAL HISTORY:  Social History   Socioeconomic History   Marital status: Widowed    Spouse name: Not on file   Number of children: 2   Years of education: 2-College   Highest education level: Not on file  Occupational History    Employer: [employer]  Tobacco Use   Smoking status: Former    Packs/day: 0.50    Years: 17.00    Total pack years: 8.50    Types: Cigarettes    Quit date: 06/22/2018    Years since quitting: 3.0   Smokeless tobacco: Never  Vaping Use   Vaping Use: Never used  Substance and Sexual Activity   Alcohol use: Not Currently    Alcohol/week: 1.0 standard drink of alcohol    Types: 1 Glasses of wine per week   Drug use:  No   Sexual activity: Not on file  Other Topics Concern   Not on file  Social History Narrative   Patient lives at home with her daughter.    Patient has 2 children.    Patient is widowed.    Patient is right handed.    Patient has her Associates degree.      Social Determinants of Health   Financial Resource Strain: Low Risk  (07/22/2018)   Overall Financial Resource Strain (CARDIA)    Difficulty of Paying Living Expenses: Not hard at all  Food Insecurity: No Food Insecurity (07/22/2018)   Hunger Vital Sign    Worried About Running Out of Food in the Last Year: Never true    Ran Out of Food in the Last Year: Never true  Transportation Needs: No Transportation Needs (07/22/2018)   PRAPARE - Hydrologist (Medical): No    Lack of Transportation (Non-Medical): No  Physical Activity: Inactive (07/22/2018)   Exercise Vital Sign    Days of Exercise per Week: 0 days    Minutes of Exercise per Session: 0 min  Stress: Stress Concern Present (07/22/2018)   [place]    Feeling of Stress : Very much  Social Connections: Moderately Isolated (07/22/2018)   Social Connection and Isolation Panel [NHANES]    Frequency of Communication with Friends and Family: More than three times a week    Frequency of Social Gatherings with Friends and Family: More than three times a week    Attends Religious Services: 1 to 4 times per year    Active Member of [employer] or Organizations: No    Attends Archivist Meetings: Never    Marital Status: Widowed  Intimate Partner Violence: Not At Risk (07/22/2018)   Humiliation, Afraid, Rape, and Kick questionnaire    Fear of Current or Ex-Partner: No    Emotionally Abused: No    Physically Abused: No    Sexually Abused: No    FAMILY HISTORY:  Family History  Problem Relation Age of Onset   Hypertension Mother    Obesity Mother    Diabetes Mother    Kidney disease Mother    Peripheral vascular disease Father    Atrial fibrillation Father    COPD Brother    Osteoporosis Brother    Crohn's disease Sister    Uterine cancer Sister 66       maternal half sister   Breast cancer Maternal Aunt 51   Colon cancer Neg Hx     CURRENT MEDICATIONS:  Current Outpatient Medications  Medication Sig Dispense Refill   albuterol (VENTOLIN HFA) 108 (90 Base) MCG/ACT inhaler Inhale 2 puffs into the lungs every 6 (six) hours as needed for wheezing or shortness of breath.     B Complex-C (B-COMPLEX WITH VITAMIN C) tablet Take 1 tablet by mouth daily.     Cholecalciferol (VITAMIN D) 50 MCG (2000 UT) CAPS Take 2,000 Units by mouth daily.     dexamethasone (DECADRON) 4 MG tablet Take 1 tablet (4 mg total) by mouth daily. Three days after each treatment 30 tablet 2   diazepam (VALIUM) 5 MG tablet TAKE ONE TABLET BY MOUTH AT BEDTIME (Patient taking differently: Take 5 mg by mouth at bedtime.) 30 tablet 5   dicyclomine (BENTYL) 10 MG capsule Take 1 capsule (10 mg total) by mouth 3  (three) times daily as needed for spasms. 90 capsule 3   folic  acid (FOLVITE) 1 MG tablet Take 1 tablet (1 mg total) by mouth daily. 30 tablet 1   gabapentin (NEURONTIN) 100 MG capsule TAKE TWO (2) CAPSULES BY MOUTH AT BEDTIME (Patient taking differently: Take 100 mg by mouth daily as needed (for nerve pain).) 60 capsule 2   guaiFENesin (MUCINEX) 600 MG 12 hr tablet Take 1 tablet (600 mg total) by mouth 2 (two) times daily. 10 tablet 0   lactulose (CHRONULAC) 10 GM/15ML solution TAKE ONE TABLESPOONFUL (15ML) EVERY 3 HOURS UNTIL BOWEL MOVEMENT ACHIEVED THEN TAKEN DAILY AS NEEDED 480 mL 6   lidocaine-prilocaine (EMLA) cream Apply 1 application. topically as needed (pain).     linaclotide (LINZESS) 290 MCG CAPS capsule Take 1 capsule (290 mcg total) by mouth daily before breakfast. (Patient taking differently: Take 290 mcg by mouth daily as needed (if no BM after a week).) 30 capsule 6   LORazepam (ATIVAN) 0.5 MG tablet Take 0.5 mg by mouth 3 (three) times daily as needed.     magic mouthwash w/lidocaine SOLN Take 5 mLs by mouth 3 (three) times daily as needed for mouth pain (Swish and spit). 480 mL 6   Magnesium 500 MG TABS Take 500 mg by mouth in the morning.     omeprazole (PRILOSEC) 40 MG capsule Take 1 capsule (40 mg total) by mouth 2 (two) times daily before a meal. 60 capsule 5   ondansetron (ZOFRAN) 4 MG tablet Take 1 tablet (4 mg total) by mouth every 8 (eight) hours as needed for nausea or vomiting. 40 tablet 3   oxycodone (ROXICODONE) 30 MG immediate release tablet Take 1 tablet (30 mg total) by mouth every 6 (six) hours as needed. 116 tablet 0   predniSONE (DELTASONE) 10 MG tablet Prednisone 40 mg po daily x 1 day then Prednisone 30 mg po daily x 1 day then Prednisone 20 mg po daily x 1 day then Prednisone 10 mg daily x 1 day then stop... 10 tablet 0   sennosides-docusate sodium (SENOKOT-S) 8.6-50 MG tablet Take 1 tablet by mouth 2 (two) times daily.     SUMAtriptan (IMITREX) 100 MG tablet  TAKE ONE TABLET BY MOUTH PRN UP to TWICE DAILY AS NEEDED. (Patient taking differently: Take 100 mg by mouth 2 (two) times daily as needed for migraine.) 9 tablet 5   tiZANidine (ZANAFLEX) 4 MG tablet Take 1 tablet (4 mg total) by mouth 3 (three) times daily as needed for muscle spasms. (Patient taking differently: Take 4 mg by mouth daily as needed for muscle spasms.) 60 tablet 0   triamcinolone cream (KENALOG) 0.1 % Apply 1 application topically 2 (two) times daily as needed (rash). 80 g 11   venlafaxine XR (EFFEXOR-XR) 150 MG 24 hr capsule TAKE ONE CAPSULE BY MOUTH DAILY WITH BREAKFAST (Patient taking differently: Take 150 mg by mouth daily with breakfast.) 30 capsule 6   No current facility-administered medications for this visit.   Facility-Administered Medications Ordered in Other Visits  Medication Dose Route Frequency Provider Last Rate Last Admin   0.9 %  sodium chloride infusion   Intravenous Continuous Derek Jack, MD   Stopped at 10/23/20 1547   sodium chloride flush (NS) 0.9 % injection 10 mL  10 mL Intravenous PRN Derek Jack, MD   10 mL at 10/23/20 1544    ALLERGIES:  Allergies  Allergen Reactions   Morphine And Related Itching   Nickel Itching   Nortriptyline Other (See Comments)    Significant weight gain   Topamax [Topiramate]  Diarrhea and Nausea Only   Xanax [Alprazolam]     Can't wake up    Actifed Cold-Allergy [Chlorpheniramine-Phenyleph Er] Rash    Red dye only   Amoxicillin Rash    Did it involve swelling of the face/tongue/throat, SOB, or low BP? Unknown Did it involve sudden or severe rash/hives, skin peeling, or any reaction on the inside of your mouth or nose? Unknown Did you need to seek medical attention at a hospital or doctor's office? Unknown When did it last happen? teenager       If all above answers are "NO", may proceed with cephalosporin use.    Codeine Hives   Erythromycin Rash   Penicillins Rash    Did it involve swelling of  the face/tongue/throat, SOB, or low BP? Unknown Did it involve sudden or severe rash/hives, skin peeling, or any reaction on the inside of your mouth or nose? Unknown Did you need to seek medical attention at a hospital or doctor's office? Unknown When did it last happen? teenager       If all above answers are "NO", may proceed with cephalosporin use.    Sudafed [Pseudoephedrine Hcl] Rash    Red dye only    PHYSICAL EXAM:  Performance status (ECOG): 1 - Symptomatic but completely ambulatory  There were no vitals filed for this visit. Wt Readings from Last 3 Encounters:  06/23/21 188 lb 11.4 oz (85.6 kg)  06/18/21 190 lb (86.2 kg)  05/28/21 185 lb (83.9 kg)   Physical Exam   LABORATORY DATA:  I have reviewed the labs as listed.     Latest Ref Rng & Units 07/11/2021    1:54 PM 06/25/2021    3:05 AM 06/24/2021    3:30 AM  CBC  WBC 4.0 - 10.5 K/uL 4.0  3.3  2.7   Hemoglobin 12.0 - 15.0 g/dL 9.3  8.7  9.9   Hematocrit 36.0 - 46.0 % 28.1  26.3  30.2   Platelets 150 - 400 K/uL 142  71  82       Latest Ref Rng & Units 07/11/2021    1:54 PM 06/24/2021    3:30 AM 06/23/2021    2:52 PM  CMP  Glucose 70 - 99 mg/dL 149  208  156   BUN 6 - 20 mg/dL '8  19  19   ' Creatinine 0.44 - 1.00 mg/dL 0.68  0.61  0.63   Sodium 135 - 145 mmol/L 135  139  138   Potassium 3.5 - 5.1 mmol/L 3.7  4.5  4.0   Chloride 98 - 111 mmol/L 99  106  101   CO2 22 - 32 mmol/L '25  24  24   ' Calcium 8.9 - 10.3 mg/dL 8.8  9.3  9.6   Total Protein 6.5 - 8.1 g/dL 6.6  6.9  7.1   Total Bilirubin 0.3 - 1.2 mg/dL 0.4  0.5  0.5   Alkaline Phos 38 - 126 U/L 96  77  79   AST 15 - 41 U/L '24  18  17   ' ALT 0 - 44 U/L '24  17  16     ' DIAGNOSTIC IMAGING:  I have independently reviewed the scans and discussed with the patient. CT CHEST ABDOMEN PELVIS W CONTRAST  Result Date: 07/13/2021 CLINICAL DATA:  Ovarian cancer, assess treatment response * Tracking Code: BO * EXAM: CT CHEST, ABDOMEN, AND PELVIS WITH CONTRAST TECHNIQUE:  Multidetector CT imaging of the chest, abdomen and pelvis was performed following  the standard protocol during bolus administration of intravenous contrast. RADIATION DOSE REDUCTION: This exam was performed according to the departmental dose-optimization program which includes automated exposure control, adjustment of the mA and/or kV according to patient size and/or use of iterative reconstruction technique. CONTRAST:  164m OMNIPAQUE IOHEXOL 300 MG/ML SOLN additional oral enteric contrast COMPARISON:  CT chest angiogram, 06/22/2021, CT abdomen pelvis, 05/05/1998 23, CT abdomen pelvis, 02/21/2021, CT chest abdomen pelvis, 12/06/2020 FINDINGS: CT CHEST FINDINGS Cardiovascular: Right chest port catheter. Normal heart size. No pericardial effusion. Mediastinum/Nodes: No enlarged mediastinal, hilar, or axillary lymph nodes. Large hiatal hernia with intrathoracic position of the gastric body and fundus. Thyroid gland, trachea, and esophagus demonstrate no significant findings. Lungs/Pleura: Diffuse bilateral bronchial wall thickening. Scattered centrilobular ground-glass nodules throughout the lungs are similar to prior examination, minimally fluctuant in some areas (series 6, image 36, 65, 88). Unchanged high attenuation pleural thickening of the paramedian left upper lobe (series 2, image 11). No pleural effusion or pneumothorax. Musculoskeletal: No chest wall mass or suspicious osseous lesions identified. CT ABDOMEN PELVIS FINDINGS Hepatobiliary: No focal liver abnormality is seen. Status post cholecystectomy. No biliary dilatation. Pancreas: Unremarkable. No pancreatic ductal dilatation or surrounding inflammatory changes. Spleen: Unchanged appearance of the spleen with a heterogeneous fluid attenuation lesion superiorly and coil material in the distal splenic artery. Adrenals/Urinary Tract: Adrenal glands are unremarkable. Kidneys are normal, without renal calculi, solid lesion, or hydronephrosis. Bladder is  unremarkable. Stomach/Bowel: Stomach is within normal limits. Appendix appears normal. No evidence of bowel wall thickening, distention, or inflammatory changes. Vascular/Lymphatic: Scattered aortic atherosclerosis. Coil material in the vicinity of the distal splenic artery. No enlarged abdominal or pelvic lymph nodes. Reproductive: Status post hysterectomy. Other: Fat containing bilateral inguinal hernias.  No ascites. Musculoskeletal: No acute osseous findings. Left hip total arthroplasty. IMPRESSION: 1. No evidence of recurrent or metastatic disease to the chest, abdomen, or pelvis. Previously seen peritoneal, omental, and nodal metastatic disease in the pelvis remains resolved. 2. Diffuse bilateral bronchial wall thickening with scattered centrilobular ground-glass nodules throughout the lungs are similar to prior examination, minimally fluctuant in some areas. Findings are consistent with ongoing atypical infection or inflammation, metastatic disease not characteristically seen with this appearance. 3. Large hiatal hernia with intrathoracic position of the gastric body and fundus. 4. Unchanged appearance of the spleen status post coil embolization. Aortic Atherosclerosis (ICD10-I70.0). Electronically Signed   By: ADelanna AhmadiM.D.   On: 07/13/2021 10:48   DG Chest Port 1V same Day  Result Date: 06/26/2021 CLINICAL DATA:  Reason for exam: dyspnea  Hx of ovarian carcinoma EXAM: PORTABLE CHEST - 1 VIEW SAME DAY COMPARISON:  06/22/2021 FINDINGS: Chronic coarse bronchovascular markings with no confluent infiltrate, nodule, or overt edema. Stable right IJ power injectable port catheter. Moderate hiatal hernia.  Heart size normal. No effusion. Visualized bones unremarkable. IMPRESSION: No acute cardiopulmonary disease.  Moderate hiatal hernia. Electronically Signed   By: DLucrezia EuropeM.D.   On: 06/26/2021 08:18   CT Angio Chest PE W/Cm &/Or Wo Cm  Result Date: 06/22/2021 CLINICAL DATA:  Pulmonary embolism (PE)  suspected, positive D-dimer. Pt is a cancer patient with last chemo tx on Monday. Pt also has cough; was placed on 4L by EMS, O2 was then 93%. BP 112/73, no meds given en route by EMS EXAM: CT ANGIOGRAPHY CHEST WITH CONTRAST TECHNIQUE: Multidetector CT imaging of the chest was performed using the standard protocol during bolus administration of intravenous contrast. Multiplanar CT image reconstructions and MIPs were obtained to  evaluate the vascular anatomy. RADIATION DOSE REDUCTION: This exam was performed according to the departmental dose-optimization program which includes automated exposure control, adjustment of the mA and/or kV according to patient size and/or use of iterative reconstruction technique. CONTRAST:  128m OMNIPAQUE IOHEXOL 350 MG/ML SOLN COMPARISON:  None Available. FINDINGS: Cardiovascular: Satisfactory opacification of the pulmonary arteries to the segmental level. No evidence of pulmonary embolism. The main pulmonary artery is normal in caliber. Normal heart size. No significant pericardial effusion. The thoracic aorta is normal in caliber. No atherosclerotic plaque of the thoracic aorta. No coronary artery calcifications. Mediastinum/Nodes: No enlarged mediastinal, hilar, or axillary lymph nodes. Thyroid gland, trachea, and esophagus demonstrate no significant findings. Large hiatal hernia containing the majority of the stomach. No associated bowel wall thickening or pneumatosis. No dilatation of the stomach. Lungs/Pleura: Biapical pleural/pulmonary scarring. Vague patchy peribronchovascular airspace opacity/centrilobular nodules. No pulmonary nodule. No pulmonary mass. No pleural effusion. No pneumothorax. Upper Abdomen: Vascular correlating noted along left upper abdomen. Status post cholecystectomy. Otherwise no acute abnormality. Musculoskeletal: No chest wall abnormality. No suspicious lytic or blastic osseous lesions. No acute displaced fracture. Review of the MIP images confirms the  above findings. IMPRESSION: 1. No pulmonary embolus. 2. Vague patchy peribronchovascular airspace opacity/centrilobular nodules. Finding could represent infection/inflammation (such as aspiration pneumonia). Recommend short-term 3 month CT follow-up to evaluate for stability. 3. Large hiatal hernia. No findings of associated obstruction. No findings suggest ischemia. Electronically Signed   By: MIven FinnM.D.   On: 06/22/2021 23:22   DG Chest Portable 1 View  Result Date: 06/22/2021 CLINICAL DATA:  Shortness of breath EXAM: PORTABLE CHEST 1 VIEW COMPARISON:  05/04/2021 FINDINGS: Right Port-A-Cath remains in place, unchanged. Large hiatal hernia. Heart and mediastinal contours are within normal limits. No focal opacities or effusions. No acute bony abnormality. IMPRESSION: No active cardiopulmonary disease. Large hiatal hernia. Electronically Signed   By: KRolm BaptiseM.D.   On: 06/22/2021 21:12     ASSESSMENT:  1.  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 -Robotic assisted laparoscopic TAH and BSO and omentectomy on 12/24/2018, pathology showing high-grade serous carcinoma, PT3P NX. -Germline mutation testing was negative. -3 more cycles of adjuvant chemotherapy completed on 03/15/2019. -CTAP on 04/13/2019 showed no findings of active malignancy.  28% reduction in the volume of presumed chronic hematoma/chronic fluid collection splaying the upper margin of the spleen.  Large type III hiatal hernia. -CTAP on 10/13/2019 shows no findings of recurrence or metastatic disease. - Foundation 1 shows HRD+, LOH score>16%.  MSI-stable.  MYC amplification.  T p53 mutation. - We reviewed CT CAP from 02/21/2021 which showed progressive peritoneal metastasis compared to 12/06/2020 scan.  No bowel obstruction.  Small right pleural effusion with mild enlargement of an isolated left  external iliac lymph node. - Reviewed EGD from 02/28/2021 which showed hiatal hernia and normal findings. - She reported epigastric and left upper quadrant pain worse in the last 1 week which is related to progression of her malignancy. - Cycle 1 of carboplatin and paclitaxel started on 03/05/2021.   PLAN:  1.  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  I have reviewed hospitalization records. - CT CAP (07/12/2021): No evidence of recurrence or metastatic disease in the chest, abdomen or pelvis.  Previously seen peritoneal, omental, nodal metastatic  disease is resolved. - She is complaining of severe fatigue.  It could be combination of recent hospitalization and anemia. - We talked about maintenance therapy with olaparib 300 mg twice daily.  She had HRD positive on previous NGS testing. - We will hold off on starting olaparib at this time due to severe fatigue. - We discussed the adverse effects including but not limited to worsening anemia, cytopenias, fatigue, increased serum creatinine, constipation/diarrhea/decreased appetite, skin rashes among others. - I will reevaluate her in 4 weeks and plan to start olaparib at that time.   2.  Lower back/left upper quadrant pain: - Continue oxycodone 20 mg 4 to 5 tablets daily.  Pain is well controlled.   3.  Macrocytic anemia: - Hemoglobin is 9.3 with MCV 841. - Recent folic acid was 4.9 with normal L24. - Continue folic acid supplements.  We will plan to repeat folic acid in 4 weeks.  4.  Iron deficiency state: - Last Feraheme on 02/19/2021. - Hemoglobin today is 9.3.  Will check anemia panel at next visit.  5.  Constipation: - Continue Linzess 290 mcg daily which is helping.  6.  Hypomagnesemia: - Continue magnesium 3 times daily.   Orders placed this encounter:  No orders of the defined types were  placed in this encounter.    Derek Jack, MD [place] [phone]   I, Thana Ates, am acting as a scribe for Dr. Derek Jack.  I, Derek Jack MD, have reviewed the above documentation for accuracy and completeness, and I agree with the above.   Marland Kitchen

## 2021-07-24 ENCOUNTER — Other Ambulatory Visit (HOSPITAL_COMMUNITY): Payer: Self-pay

## 2021-07-24 MED ORDER — OXYCODONE HCL 30 MG PO TABS: 30.0000 mg | ORAL_TABLET | Freq: Four times a day (QID) | ORAL | 0 refills | Status: DC | PRN

## 2021-08-01 ENCOUNTER — Ambulatory Visit (INDEPENDENT_AMBULATORY_CARE_PROVIDER_SITE_OTHER): Payer: Medicare Other

## 2021-08-01 ENCOUNTER — Ambulatory Visit (INDEPENDENT_AMBULATORY_CARE_PROVIDER_SITE_OTHER): Payer: Medicare Other | Admitting: Orthopaedic Surgery

## 2021-08-01 DIAGNOSIS — M25561 Pain in right knee: Secondary | ICD-10-CM

## 2021-08-01 DIAGNOSIS — Z96642 Presence of left artificial hip joint: Secondary | ICD-10-CM | POA: Diagnosis not present

## 2021-08-01 NOTE — Progress Notes (Signed)
Office Visit Note   Patient: Michelle Holt           Date of Birth: Feb 21, 1970           MRN: [id number] Visit Date: 08/01/2021              Requested by: [name], MD [place],  [place] [id number] PCP: [name], MD   Assessment & Plan: Visit Diagnoses:  1. Status post total replacement of left hip   2. Acute pain of right knee     Plan: Impression is right knee contusion.  Recommend symptomatic treatment.  Sounds like it is already getting better.  In regards to the left hip she has done well from the surgery and is about a year out.  Dental prophylaxis reinforced.  Recheck in another year with standing [facility] pelvis x-rays.  Follow-Up Instructions: No follow-ups on file.   Orders:  Orders Placed This Encounter  Procedures   XR Pelvis 1-2 Views   XR KNEE 3 VIEW RIGHT   No orders of the defined types were placed in this encounter.     Procedures: No procedures performed   Clinical Data: No additional findings.   Subjective: Chief Complaint  Patient presents with   Left Hip - Pain   Right Knee - Pain    HPI Michelle Holt is following up 1 year status post left total hip replacement doing well overall.  Had a recent fall onto the right knee with some bruising.  Unfortunately her ovarian cancer has returned.  Finished chemo recently.  Review of Systems  Constitutional: Negative.   HENT: Negative.    Eyes: Negative.   Respiratory: Negative.    Cardiovascular: Negative.   Endocrine: Negative.   Musculoskeletal: Negative.   Neurological: Negative.   Hematological: Negative.   Psychiatric/Behavioral: Negative.    All other systems reviewed and are negative.    Objective: Vital Signs: LMP  (LMP Unknown)   Physical Exam Vitals and nursing note reviewed.  Constitutional:      Appearance: She is well-developed.  Pulmonary:     Effort: Pulmonary effort is normal.  Skin:    General: Skin is warm.     Capillary Refill: Capillary refill takes less than  2 seconds.  Neurological:     Mental Status: She is alert and oriented to person, place, and time.  Psychiatric:        Behavior: Behavior normal.        Thought Content: Thought content normal.        Judgment: Judgment normal.     Ortho Exam Examination of the left hip shows fully healed surgical scar.  No evidence of infection.  Good range of motion and circumduction. Examination of right knee shows mild bruising and swelling.  Collaterals and cruciates are stable.  No joint line tenderness.  Slight tenderness to the posterior lateral region of the knee and down into the calf. Specialty Comments:  No specialty comments available.  Imaging: XR KNEE 3 VIEW RIGHT  Result Date: 08/01/2021 No acute or structural abnormalities.  Mild osteoarthritis with periarticular spurring.  XR Pelvis 1-2 Views  Result Date: 08/01/2021 Stable total hip replacement without complications    PMFS History: Patient Active Problem List   Diagnosis Date Noted   Palliative care by specialist    Goals of care, counseling/discussion    General weakness    Leukopenia 06/23/2021   Thrombocytopenia (HCC) 06/23/2021   Elevated d-dimer 06/23/2021   Low  back pain 06/23/2021   Hiatal hernia 06/23/2021   Obesity (BMI 30-39.9) 06/23/2021   Acute bronchospasm 06/23/2021   CAP (community acquired pneumonia) 06/22/2021   GERD (gastroesophageal reflux disease)    Melena 11/07/2020   Iron deficiency anemia 11/07/2020   Post-op pain 07/12/2020   Splenic lesion 07/12/2020   Status post total replacement of left hip 06/05/2020   Primary osteoarthritis of left hip 02/09/2020   Pain in right knee 02/09/2020   Palliative care patient 09/18/2018   Port-A-Cath in place 08/20/2018   Genetic testing 08/20/2018   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   Splenic laceration 07/31/2018   Family history of uterine cancer    Family history of breast cancer    Macrocytic anemia 07/18/2018    Upper GI bleeding 07/18/2018   Ovarian cancer, bilateral ([place]) 07/18/2018   HCAP (healthcare-associated pneumonia) 07/18/2018   Pleural effusion 07/07/2018   Migraine 07/06/2018   Pleural effusion on left 07/06/2018   Acute respiratory failure with hypoxia ([place]) 07/06/2018   Tobacco abuse 07/06/2018   Generalized anxiety disorder 05/02/2014   Unspecified constipation 08/10/2013   Rectal bleeding 08/10/2013   Lumbosacral spondylosis without myelopathy 10/23/2011   Intractable migraine without aura 10/23/2011   Past Medical History:  Diagnosis Date   Anemia    Anxiety and depression    Arthritis of facet joints at multiple vertebral levels    L5-S1   Constipation    Dyslipidemia    Family history of breast cancer    Family history of uterine cancer    GERD (gastroesophageal reflux disease)    History of hiatal hernia    History of kidney stones    Insomnia    Irritable bowel syndrome    Migraine    Muscle tension headache    Neuropathy of finger    Ovarian carcinoma (HCC)    ovarian   Plantar fasciitis of right foot    Port-A-Cath in place 08/20/2018    Family History  Problem Relation Age of Onset   Hypertension Mother    Obesity Mother    Diabetes Mother    Kidney disease Mother    Peripheral vascular disease Father    Atrial fibrillation Father    COPD Brother    Osteoporosis Brother    Crohn's disease Sister    Uterine cancer Sister 51       maternal half sister   Breast cancer Maternal Aunt 48   Colon cancer Neg Hx     Past Surgical History:  Procedure Laterality Date   ABDOMINAL HYSTERECTOMY     BIOPSY  10/27/2020   Procedure: BIOPSY;  Surgeon: Harvel Quale, MD;  [facility];  Service: Gastroenterology;;   CHOLECYSTECTOMY  2008   COLONOSCOPY N/A 08/13/2013   Procedure: COLONOSCOPY;  Surgeon: Rogene Houston, MD;  [facility];  Service: Endoscopy;  Laterality: N/A;  230-moved to 145 Ann to notify pt   COLONOSCOPY WITH  PROPOFOL N/A 11/15/2020   Procedure: COLONOSCOPY WITH PROPOFOL;  Surgeon: Rogene Houston, MD;  [facility];  Service: Endoscopy;  Laterality: N/A;  1:40   ESOPHAGEAL DILATION N/A 02/28/2021   Procedure: ESOPHAGEAL DILATION;  Surgeon: Rogene Houston, MD;  [facility];  Service: Endoscopy;  Laterality: N/A;   ESOPHAGOGASTRODUODENOSCOPY     ESOPHAGOGASTRODUODENOSCOPY (EGD) WITH PROPOFOL N/A 07/20/2018   Procedure: ESOPHAGOGASTRODUODENOSCOPY (EGD) WITH PROPOFOL;  Surgeon: Rogene Houston, MD;  [facility];  Service: Endoscopy;  Laterality: N/A;  Possible esophageal dilation.   ESOPHAGOGASTRODUODENOSCOPY (EGD) WITH PROPOFOL N/A 10/27/2020   Procedure: ESOPHAGOGASTRODUODENOSCOPY (EGD) WITH PROPOFOL;  Surgeon: Harvel Quale, MD;  [facility];  Service: Gastroenterology;  Laterality: N/A;  2:10, pt knows to arrive at 10:15   ESOPHAGOGASTRODUODENOSCOPY (EGD) WITH PROPOFOL N/A 02/28/2021   Procedure: ESOPHAGOGASTRODUODENOSCOPY (EGD) WITH PROPOFOL;  Surgeon: Rogene Houston, MD;  [facility];  Service: Endoscopy;  Laterality: N/A;  200 ASA 1   GIVENS CAPSULE STUDY N/A 11/24/2020   Procedure: GIVENS CAPSULE STUDY;  Surgeon: Rogene Houston, MD;  [facility];  Service: Endoscopy;  Laterality: N/A;  7:30   IR ANGIOGRAM SELECTIVE EACH ADDITIONAL VESSEL  08/01/2018   IR ANGIOGRAM VISCERAL SELECTIVE  08/01/2018   IR EMBO ART  VEN HEMORR LYMPH EXTRAV  INC GUIDE ROADMAPPING  08/01/2018   IR IMAGING GUIDED PORT INSERTION  08/20/2018   IR PERC PLEURAL DRAIN W/INDWELL CATH W/IMG GUIDE  07/08/2018   IR THORACENTESIS ASP PLEURAL SPACE W/IMG GUIDE  07/07/2018   IR US GUIDE VASC ACCESS RIGHT  08/01/2018   PLEURAL EFFUSION DRAINAGE Left 07/13/2018   Procedure: DRAINAGE OF LOCULATED PLEURAL EFFUSION;  Surgeon: Ivin Poot, MD;  Location: [place];  Service: Thoracic;  Laterality: Left;   POLYPECTOMY  11/15/2020   Procedure: POLYPECTOMY;   Surgeon: Rogene Houston, MD;  [facility];  Service: Endoscopy;;   REMOVAL OF PLEURAL DRAINAGE CATHETER Left 08/20/2018   Procedure: REMOVAL OF PLEURAL DRAINAGE CATHETER;  Surgeon: Ivin Poot, MD;  Location: [place];  Service: Thoracic;  Laterality: Left;   REMOVAL OF PLEURAL DRAINAGE CATHETER Left 08/20/2018   Procedure: REMOVAL OF PLEURAL DRAINAGE CATHETER;  Surgeon: Ivin Poot, MD;  Location: [place];  Service: Thoracic;  Laterality: Left;   TALC PLEURODESIS Left 07/13/2018   Procedure: Talc Pleuradesis;  Surgeon: Prescott Gum, Collier Salina, MD;  Location: [place];  Service: Thoracic;  Laterality: Left;   TOTAL HIP ARTHROPLASTY Left 06/05/2020   Procedure: LEFT TOTAL HIP ARTHROPLASTY ANTERIOR APPROACH;  Surgeon: Leandrew Koyanagi, MD;  Location: [place];  Service: Orthopedics;  Laterality: Left;  3-C   TUBAL LIGATION Bilateral    UTERINE ABLATION     VIDEO ASSISTED THORACOSCOPY Left 07/13/2018   Procedure: VIDEO ASSISTED THORACOSCOPY;  Surgeon: Prescott Gum, Collier Salina, MD;  Location: [facility];  Service: Thoracic;  Laterality: Left;   Social History   Occupational History    Employer: [employer]  Tobacco Use   Smoking status: Former    Packs/day: 0.50    Years: 17.00    Total pack years: 8.50    Types: Cigarettes    Quit date: 06/22/2018    Years since quitting: 3.1   Smokeless tobacco: Never  Vaping Use   Vaping Use: Never used  Substance and Sexual Activity   Alcohol use: Not Currently    Alcohol/week: 1.0 standard drink of alcohol    Types: 1 Glasses of wine per week   Drug use: No   Sexual activity: Not on file

## 2021-08-02 ENCOUNTER — Encounter: Payer: Self-pay | Admitting: Urology

## 2021-08-02 ENCOUNTER — Ambulatory Visit (INDEPENDENT_AMBULATORY_CARE_PROVIDER_SITE_OTHER): Payer: Medicare Other | Admitting: Urology

## 2021-08-02 VITALS — BP 108/68 | HR 97 | Ht 64.0 in | Wt 190.0 lb

## 2021-08-02 DIAGNOSIS — R3129 Other microscopic hematuria: Secondary | ICD-10-CM | POA: Diagnosis not present

## 2021-08-02 DIAGNOSIS — N3946 Mixed incontinence: Secondary | ICD-10-CM

## 2021-08-02 LAB — URINALYSIS, ROUTINE W REFLEX MICROSCOPIC
Bilirubin, UA: NEGATIVE
Glucose, UA: NEGATIVE
Ketones, UA: NEGATIVE
Leukocytes,UA: NEGATIVE
Nitrite, UA: NEGATIVE
Specific Gravity, UA: 1.015 (ref 1.005–1.030)
Urobilinogen, Ur: 1 mg/dL (ref 0.2–1.0)
pH, UA: 7.5 (ref 5.0–7.5)

## 2021-08-02 LAB — MICROSCOPIC EXAMINATION: Renal Epithel, UA: NONE SEEN /hpf

## 2021-08-02 LAB — BLADDER SCAN AMB NON-IMAGING: Scan Result: 0

## 2021-08-02 MED ORDER — MIRABEGRON ER 25 MG PO TB24: 25.0000 mg | ORAL_TABLET | Freq: Every day | ORAL | 0 refills | Status: DC

## 2021-08-02 NOTE — Progress Notes (Signed)
Assessment: 1. Mixed stress and urge urinary incontinence   2. Microscopic hematuria     Plan: Diagnosis and management of stress and urge incontinence discussed with the patient.  Options for management of stress incontinence including pelvic floor exercises, incontinence pessaries, and surgical management discussed.  Options for management of urge incontinence including avoidance of dietary irritants, behavioral modification, medical therapy, neuromodulation, and chemodenervation discussed.  Information provided to the patient. Bladder diet sheet given. Instructions on Kegel exercises provided. Discussed timed and double voiding during the daytime hours. Today I had a discussion with the patient regarding the findings of microscopic hematuria including the implications and differential diagnoses associated with it.  I also discussed recommendations for further evaluation including the rationale for upper tract imaging and cystoscopy.  I discussed the nature of these procedures including potential risk and complications.  The patient expressed an understanding of these issues. I reviewed the patient's recent CT scan from 07/12/2021. Will repeat U/A next visit. Return to office in 3-4 weeks   Chief Complaint:  Chief Complaint  Patient presents with   Urinary Incontinence    History of Present Illness:  Michelle Holt is a 51 y.o. year old female who is seen in consultation from Derek Jack, MD for evaluation of urinary incontinence.  She is status post a abdominal hysterectomy for treatment of ovarian cancer in 2020.  She has recently completed a course of chemotherapy as well.  She has had symptoms of stress incontinence for approximately 1 year.  She reports incontinence associated with coughing.  This does not occur on a daily basis.  She has also noted increased nocturia 5-6 times per night and urgency and urge incontinence at night.  She voids 1 time per day despite drinking  fluids throughout the day.  She is using pantiliners during the day.  No dysuria or gross hematuria.  No recent UTIs.  No prior treatment for incontinence.  She denies any fecal incontinence.  CT chest abdomen and pelvis from 07/12/2021 showed no renal masses, no renal calculi, no obstruction, and a normal-appearing bladder.  There was no evidence of recurrent or metastatic disease.  Past Medical History:  Past Medical History:  Diagnosis Date   Anemia    Anxiety and depression    Arthritis of facet joints at multiple vertebral levels    L5-S1   Constipation    Dyslipidemia    Family history of breast cancer    Family history of uterine cancer    GERD (gastroesophageal reflux disease)    History of hiatal hernia    History of kidney stones    Insomnia    Irritable bowel syndrome    Migraine    Muscle tension headache    Neuropathy of finger    Ovarian carcinoma (HCC)    ovarian   Plantar fasciitis of right foot    Port-A-Cath in place 08/20/2018    Past Surgical History:  Past Surgical History:  Procedure Laterality Date   ABDOMINAL HYSTERECTOMY     BIOPSY  10/27/2020   Procedure: BIOPSY;  Surgeon: Harvel Quale, MD;  [facility];  Service: Gastroenterology;;   CHOLECYSTECTOMY  2008   COLONOSCOPY N/A 08/13/2013   Procedure: COLONOSCOPY;  Surgeon: Rogene Houston, MD;  [facility];  Service: Endoscopy;  Laterality: N/A;  230-moved to 145 Ann to notify pt   COLONOSCOPY WITH PROPOFOL N/A 11/15/2020   Procedure: COLONOSCOPY WITH PROPOFOL;  Surgeon: Rogene Houston, MD;  [facility];  Service: Endoscopy;  Laterality: N/A;  1:40   ESOPHAGEAL DILATION N/A 02/28/2021   Procedure: ESOPHAGEAL DILATION;  Surgeon: Rogene Houston, MD;  [facility];  Service: Endoscopy;  Laterality: N/A;   ESOPHAGOGASTRODUODENOSCOPY     ESOPHAGOGASTRODUODENOSCOPY (EGD) WITH PROPOFOL N/A 07/20/2018   Procedure: ESOPHAGOGASTRODUODENOSCOPY (EGD) WITH  PROPOFOL;  Surgeon: Rogene Houston, MD;  [facility];  Service: Endoscopy;  Laterality: N/A;  Possible esophageal dilation.   ESOPHAGOGASTRODUODENOSCOPY (EGD) WITH PROPOFOL N/A 10/27/2020   Procedure: ESOPHAGOGASTRODUODENOSCOPY (EGD) WITH PROPOFOL;  Surgeon: Harvel Quale, MD;  [facility];  Service: Gastroenterology;  Laterality: N/A;  2:10, pt knows to arrive at 10:15   ESOPHAGOGASTRODUODENOSCOPY (EGD) WITH PROPOFOL N/A 02/28/2021   Procedure: ESOPHAGOGASTRODUODENOSCOPY (EGD) WITH PROPOFOL;  Surgeon: Rogene Houston, MD;  [facility];  Service: Endoscopy;  Laterality: N/A;  200 ASA 1   GIVENS CAPSULE STUDY N/A 11/24/2020   Procedure: GIVENS CAPSULE STUDY;  Surgeon: Rogene Houston, MD;  [facility];  Service: Endoscopy;  Laterality: N/A;  7:30   IR ANGIOGRAM SELECTIVE EACH ADDITIONAL VESSEL  08/01/2018   IR ANGIOGRAM VISCERAL SELECTIVE  08/01/2018   IR EMBO ART  VEN HEMORR LYMPH EXTRAV  INC GUIDE ROADMAPPING  08/01/2018   IR IMAGING GUIDED PORT INSERTION  08/20/2018   IR PERC PLEURAL DRAIN W/INDWELL CATH W/IMG GUIDE  07/08/2018   IR THORACENTESIS ASP PLEURAL SPACE W/IMG GUIDE  07/07/2018   IR US GUIDE VASC ACCESS RIGHT  08/01/2018   PLEURAL EFFUSION DRAINAGE Left 07/13/2018   Procedure: DRAINAGE OF LOCULATED PLEURAL EFFUSION;  Surgeon: Ivin Poot, MD;  Location: [place];  Service: Thoracic;  Laterality: Left;   POLYPECTOMY  11/15/2020   Procedure: POLYPECTOMY;  Surgeon: Rogene Houston, MD;  [facility];  Service: Endoscopy;;   REMOVAL OF PLEURAL DRAINAGE CATHETER Left 08/20/2018   Procedure: REMOVAL OF PLEURAL DRAINAGE CATHETER;  Surgeon: Ivin Poot, MD;  Location: [place];  Service: Thoracic;  Laterality: Left;   REMOVAL OF PLEURAL DRAINAGE CATHETER Left 08/20/2018   Procedure: REMOVAL OF PLEURAL DRAINAGE CATHETER;  Surgeon: Ivin Poot, MD;  Location: [place];  Service: Thoracic;  Laterality: Left;   TALC PLEURODESIS  Left 07/13/2018   Procedure: Talc Pleuradesis;  Surgeon: Prescott Gum, Collier Salina, MD;  Location: [place];  Service: Thoracic;  Laterality: Left;   TOTAL HIP ARTHROPLASTY Left 06/05/2020   Procedure: LEFT TOTAL HIP ARTHROPLASTY ANTERIOR APPROACH;  Surgeon: Leandrew Koyanagi, MD;  Location: [place];  Service: Orthopedics;  Laterality: Left;  3-C   TUBAL LIGATION Bilateral    UTERINE ABLATION     VIDEO ASSISTED THORACOSCOPY Left 07/13/2018   Procedure: VIDEO ASSISTED THORACOSCOPY;  Surgeon: Ivin Poot, MD;  Location: [place];  Service: Thoracic;  Laterality: Left;    Allergies:  Allergies  Allergen Reactions   Morphine And Related Itching   Nickel Itching   Nortriptyline Other (See Comments)    Significant weight gain   Topamax [Topiramate] Diarrhea and Nausea Only   Xanax [Alprazolam]     Can't wake up    Actifed Cold-Allergy [Chlorpheniramine-Phenyleph Er] Rash    Red dye only   Amoxicillin Rash    Did it involve swelling of the face/tongue/throat, SOB, or low BP? Unknown Did it involve sudden or severe rash/hives, skin peeling, or any reaction on the inside of your mouth or nose? Unknown Did you need to seek medical attention at  a hospital or doctor's office? Unknown When did it last happen? teenager       If all above answers are "NO", may proceed with cephalosporin use.    Codeine Hives   Erythromycin Rash   Penicillins Rash    Did it involve swelling of the face/tongue/throat, SOB, or low BP? Unknown Did it involve sudden or severe rash/hives, skin peeling, or any reaction on the inside of your mouth or nose? Unknown Did you need to seek medical attention at a hospital or doctor's office? Unknown When did it last happen? teenager       If all above answers are "NO", may proceed with cephalosporin use.    Sudafed [Pseudoephedrine Hcl] Rash    Red dye only    Family History:  Family History  Problem Relation Age of Onset   Hypertension Mother    Obesity Mother    Diabetes Mother     Kidney disease Mother    Peripheral vascular disease Father    Atrial fibrillation Father    COPD Brother    Osteoporosis Brother    Crohn's disease Sister    Uterine cancer Sister 30       maternal half sister   Breast cancer Maternal Aunt 62   Colon cancer Neg Hx     Social History:  Social History   Tobacco Use   Smoking status: Former    Packs/day: 0.50    Years: 17.00    Total pack years: 8.50    Types: Cigarettes    Quit date: 06/22/2018    Years since quitting: 3.1   Smokeless tobacco: Never  Vaping Use   Vaping Use: Never used  Substance Use Topics   Alcohol use: Not Currently    Alcohol/week: 1.0 standard drink of alcohol    Types: 1 Glasses of wine per week   Drug use: No    Review of symptoms:  Constitutional:  Negative for unexplained weight loss, night sweats, fever, chills ENT:  Negative for nose bleeds, sinus pain, painful swallowing CV:  Negative for chest pain, shortness of breath, exercise intolerance, palpitations, loss of consciousness Resp:  Negative for cough, wheezing, shortness of breath GI:  Negative for nausea, vomiting, diarrhea, bloody stools GU:  Positives noted in HPI; otherwise negative for gross hematuria, dysuria Neuro:  Negative for seizures, poor balance, limb weakness, slurred speech Psych:  Negative for lack of energy, depression, anxiety Endocrine:  Negative for polydipsia, polyuria, symptoms of hypoglycemia (dizziness, hunger, sweating) Hematologic:  Negative for anemia, purpura, petechia, prolonged or excessive bleeding, use of anticoagulants  Allergic:  Negative for difficulty breathing or choking as a result of exposure to anything; no shellfish allergy; no allergic response (rash/itch) to materials, foods  Physical exam: BP 108/68   Pulse 97   Ht '5\' 4"'$  (1.626 m)   Wt 190 lb (86.2 kg)   LMP  (LMP Unknown)   BMI 32.61 kg/m  GENERAL APPEARANCE:  Well appearing, well developed, well nourished, NAD HEENT: Atraumatic,  Normocephalic, oropharynx clear. NECK: Supple without lymphadenopathy or thyromegaly. LUNGS: Clear to auscultation bilaterally. HEART: Regular Rate and Rhythm without murmurs, gallops, or rubs. ABDOMEN: Soft, non-tender, No Masses. EXTREMITIES: Moves all extremities well.  Without clubbing, cyanosis, or edema. NEUROLOGIC:  Alert and oriented x 3, normal gait, CN II-XII grossly intact.  MENTAL STATUS:  Appropriate. BACK:  Non-tender to palpation.  No CVAT SKIN:  Warm, dry and intact.   GU: Urethra: Well supported; no leakage with cough or Valsalva in  supine position; no mass or discharge Vagina: No significant prolapse noted; good Kegel strength   Results: U/A: 3-10 RBC, 0-5 WBC, few bacteria  PVR = 0 ml

## 2021-08-02 NOTE — Progress Notes (Signed)
post void residual =0mL 

## 2021-08-06 ENCOUNTER — Other Ambulatory Visit (HOSPITAL_COMMUNITY): Payer: Self-pay

## 2021-08-06 ENCOUNTER — Other Ambulatory Visit: Payer: Self-pay

## 2021-08-06 DIAGNOSIS — C563 Malignant neoplasm of bilateral ovaries: Secondary | ICD-10-CM

## 2021-08-08 ENCOUNTER — Inpatient Hospital Stay (HOSPITAL_COMMUNITY): Payer: Medicare Other

## 2021-08-09 ENCOUNTER — Inpatient Hospital Stay (HOSPITAL_COMMUNITY): Payer: Medicare Other

## 2021-08-09 DIAGNOSIS — C563 Malignant neoplasm of bilateral ovaries: Secondary | ICD-10-CM

## 2021-08-09 DIAGNOSIS — Z452 Encounter for adjustment and management of vascular access device: Secondary | ICD-10-CM | POA: Diagnosis not present

## 2021-08-09 LAB — COMPREHENSIVE METABOLIC PANEL
ALT: 19 U/L (ref 0–44)
AST: 23 U/L (ref 15–41)
Albumin: 3.9 g/dL (ref 3.5–5.0)
Alkaline Phosphatase: 89 U/L (ref 38–126)
Anion gap: 7 (ref 5–15)
BUN: 8 mg/dL (ref 6–20)
CO2: 28 mmol/L (ref 22–32)
Calcium: 9.4 mg/dL (ref 8.9–10.3)
Chloride: 103 mmol/L (ref 98–111)
Creatinine, Ser: 0.57 mg/dL (ref 0.44–1.00)
GFR, Estimated: >60 mL/min (ref >60)
Glucose, Bld: 99 mg/dL (ref 70–99)
Potassium: 3.5 mmol/L (ref 3.5–5.1)
Sodium: 138 mmol/L (ref 135–145)
Total Bilirubin: 0.3 mg/dL (ref 0.3–1.2)
Total Protein: 6.8 g/dL (ref 6.5–8.1)

## 2021-08-09 LAB — CBC WITH DIFFERENTIAL/PLATELET
Abs Immature Granulocytes: 0.02 10*3/uL (ref 0.00–0.07)
Basophils Absolute: 0 10*3/uL (ref 0.0–0.1)
Basophils Relative: 1 %
Eosinophils Absolute: 0.3 10*3/uL (ref 0.0–0.5)
Eosinophils Relative: 4 %
HCT: 35.3 % — ABNORMAL LOW (ref 36.0–46.0)
Hemoglobin: 11.7 g/dL — ABNORMAL LOW (ref 12.0–15.0)
Immature Granulocytes: 0 %
Lymphocytes Relative: 42 %
Lymphs Abs: 2.7 10*3/uL (ref 0.7–4.0)
MCH: 40.5 pg — ABNORMAL HIGH (ref 26.0–34.0)
MCHC: 33.1 g/dL (ref 30.0–36.0)
MCV: 122.1 fL — ABNORMAL HIGH (ref 80.0–100.0)
Monocytes Absolute: 0.6 10*3/uL (ref 0.1–1.0)
Monocytes Relative: 10 %
Neutro Abs: 2.7 10*3/uL (ref 1.7–7.7)
Neutrophils Relative %: 43 %
Platelets: 310 10*3/uL (ref 150–400)
RBC: 2.89 MIL/uL — ABNORMAL LOW (ref 3.87–5.11)
RDW: 12.5 % (ref 11.5–15.5)
WBC: 6.3 10*3/uL (ref 4.0–10.5)
nRBC: 0 % (ref 0.0–0.2)

## 2021-08-09 LAB — MAGNESIUM: Magnesium: 1.5 mg/dL — ABNORMAL LOW (ref 1.7–2.4)

## 2021-08-09 MED ORDER — HEPARIN SOD (PORK) LOCK FLUSH 100 UNIT/ML IV SOLN
500.0000 [IU] | Freq: Once | INTRAVENOUS | Status: AC
  Administered 2021-08-09: 500 [IU] via INTRAVENOUS

## 2021-08-09 MED ORDER — SODIUM CHLORIDE 0.9% FLUSH
10.0000 mL | INTRAVENOUS | Status: DC | PRN
  Administered 2021-08-09: 10 mL via INTRAVENOUS

## 2021-08-09 NOTE — Progress Notes (Signed)
Patients port flushed without difficulty.  Good blood return noted with no bruising or swelling noted at site.  Band aid applied.  VSS with discharge and left in satisfactory condition with no s/s of distress noted.   

## 2021-08-09 NOTE — Patient Instructions (Signed)
[facility]  Discharge Instructions: Thank you for choosing [facility] to provide your oncology and hematology care.  If you have a lab appointment with the Cancer Center, please come in thru the Main Entrance and check in at the main information desk.  Wear comfortable clothing and clothing appropriate for easy access to any Portacath or PICC line.   We strive to give you quality time with your provider. You may need to reschedule your appointment if you arrive late (15 or more minutes).  Arriving late affects you and other patients whose appointments are after yours.  Also, if you miss three or more appointments without notifying the office, you may be dismissed from the clinic at the provider's discretion.      For prescription refill requests, have your pharmacy contact our office and allow 72 hours for refills to be completed.         To help prevent nausea and vomiting after your treatment, we encourage you to take your nausea medication as directed.  BELOW ARE SYMPTOMS THAT SHOULD BE REPORTED IMMEDIATELY: *FEVER GREATER THAN 100.4 F (38 C) OR HIGHER *CHILLS OR SWEATING *NAUSEA AND VOMITING THAT IS NOT CONTROLLED WITH YOUR NAUSEA MEDICATION *UNUSUAL SHORTNESS OF BREATH *UNUSUAL BRUISING OR BLEEDING *URINARY PROBLEMS (pain or burning when urinating, or frequent urination) *BOWEL PROBLEMS (unusual diarrhea, constipation, pain near the anus) TENDERNESS IN MOUTH AND THROAT WITH OR WITHOUT PRESENCE OF ULCERS (sore throat, sores in mouth, or a toothache) UNUSUAL RASH, SWELLING OR PAIN  UNUSUAL VAGINAL DISCHARGE OR ITCHING   Items with * indicate a potential emergency and should be followed up as soon as possible or go to the Emergency Department if any problems should occur.  Please show the CHEMOTHERAPY ALERT CARD or IMMUNOTHERAPY ALERT CARD at check-in to the Emergency Department and triage nurse.  Should you have questions after your visit or need to  cancel or reschedule your appointment, please contact  CANCER CENTER [phone]  and follow the prompts.  Office hours are 8:00 a.m. to 4:30 p.m. Monday - Friday. Please note that voicemails left after 4:00 p.m. may not be returned until the following business day.  We are closed weekends and major holidays. You have access to a nurse at all times for urgent questions. Please call the main number to the clinic [phone] and follow the prompts.  For any non-urgent questions, you may also contact your provider using MyChart. We now offer e-Visits for anyone 18 and older to request care online for non-urgent symptoms. For details visit [email or web address].   Also download the MyChart app! Go to the app store, search "MyChart", open the app, select [place], and log in with your MyChart username and password.  Masks are optional in the cancer centers. If you would like for your care team to wear a mask while they are taking care of you, please let them know. For doctor visits, patients may have with them one support person who is at least 52 years old. At this time, visitors are not allowed in the infusion area.  

## 2021-08-14 ENCOUNTER — Other Ambulatory Visit: Payer: Self-pay

## 2021-08-15 ENCOUNTER — Encounter: Payer: Self-pay | Admitting: Hematology

## 2021-08-15 ENCOUNTER — Other Ambulatory Visit: Payer: Self-pay | Admitting: *Deleted

## 2021-08-15 ENCOUNTER — Other Ambulatory Visit (HOSPITAL_COMMUNITY): Payer: Self-pay

## 2021-08-15 ENCOUNTER — Telehealth: Payer: Self-pay | Admitting: Pharmacy Technician

## 2021-08-15 ENCOUNTER — Inpatient Hospital Stay: Payer: Medicare Other | Attending: Hematology | Admitting: Hematology

## 2021-08-15 ENCOUNTER — Telehealth (HOSPITAL_COMMUNITY): Payer: Self-pay | Admitting: Pharmacist

## 2021-08-15 ENCOUNTER — Encounter (HOSPITAL_COMMUNITY): Payer: Self-pay | Admitting: Hematology

## 2021-08-15 VITALS — BP 109/74 | HR 99 | Temp 98.0°F | Resp 18 | Wt 184.5 lb

## 2021-08-15 DIAGNOSIS — D539 Nutritional anemia, unspecified: Secondary | ICD-10-CM | POA: Diagnosis not present

## 2021-08-15 DIAGNOSIS — Z9079 Acquired absence of other genital organ(s): Secondary | ICD-10-CM | POA: Diagnosis not present

## 2021-08-15 DIAGNOSIS — Z87891 Personal history of nicotine dependence: Secondary | ICD-10-CM | POA: Insufficient documentation

## 2021-08-15 DIAGNOSIS — Z79899 Other long term (current) drug therapy: Secondary | ICD-10-CM | POA: Insufficient documentation

## 2021-08-15 DIAGNOSIS — Z90722 Acquired absence of ovaries, bilateral: Secondary | ICD-10-CM | POA: Insufficient documentation

## 2021-08-15 DIAGNOSIS — R112 Nausea with vomiting, unspecified: Secondary | ICD-10-CM | POA: Insufficient documentation

## 2021-08-15 DIAGNOSIS — Z90710 Acquired absence of both cervix and uterus: Secondary | ICD-10-CM | POA: Diagnosis not present

## 2021-08-15 DIAGNOSIS — C563 Malignant neoplasm of bilateral ovaries: Secondary | ICD-10-CM | POA: Insufficient documentation

## 2021-08-15 DIAGNOSIS — K5900 Constipation, unspecified: Secondary | ICD-10-CM | POA: Insufficient documentation

## 2021-08-15 MED ORDER — MAGNESIUM OXIDE -MG SUPPLEMENT 400 (240 MG) MG PO TABS: 400.0000 mg | ORAL_TABLET | Freq: Two times a day (BID) | ORAL | 6 refills | Status: DC

## 2021-08-15 MED ORDER — OXYCODONE HCL 20 MG PO TABS
20.0000 mg | ORAL_TABLET | Freq: Four times a day (QID) | ORAL | 0 refills | Status: DC | PRN
Start: 2021-08-15 — End: 2021-09-03

## 2021-08-15 MED ORDER — OLAPARIB 150 MG PO TABS
300.0000 mg | ORAL_TABLET | Freq: Two times a day (BID) | ORAL | 2 refills | Status: DC
  Filled 2021-08-15 – 2021-08-20 (×2): qty 120, 30d supply, fill #0
  Filled 2021-09-07: qty 120, 30d supply, fill #1
  Filled 2021-10-15: qty 120, 30d supply, fill #2

## 2021-08-15 MED ORDER — OXYCODONE HCL 20 MG PO TABS: 30.0000 mg | ORAL_TABLET | Freq: Four times a day (QID) | ORAL | 0 refills | Status: DC | PRN

## 2021-08-15 NOTE — Patient Instructions (Addendum)
[place] at [facility] Discharge Instructions   You were seen and examined today by Dr. Delton Coombes.  He reviewed the results of your lab work which are normal/stable. Your magnesium was low at 1.4. We sent a prescription for magnesium for you to take twice a day.   We will plan to start you on Lynparza pills for treatment. Expect a call from [facility] to discuss this pill and arrange delivery.   Return as scheduled in 3 weeks.    Thank you for choosing [place] at [facility] to provide your oncology and hematology care.  To afford each patient quality time with our provider, please arrive at least 15 minutes before your scheduled appointment time.   If you have a lab appointment with the [facility] please come in thru the Main Entrance and check in at the main information desk.  You need to re-schedule your appointment should you arrive 10 or more minutes late.  We strive to give you quality time with our providers, and arriving late affects you and other patients whose appointments are after yours.  Also, if you no show three or more times for appointments you may be dismissed from the clinic at the providers discretion.     Again, thank you for choosing [facility].  Our hope is that these requests will decrease the amount of time that you wait before being seen by our physicians.       _____________________________________________________________  Should you have questions after your visit to [facility], please contact our office at [phone] and follow the prompts.  Our office hours are 8:00 a.m. and 4:30 p.m. Monday - Friday.  Please note that voicemails left after 4:00 p.m. may not be returned until the following business day.  We are closed weekends and major holidays.  You do have access to a nurse 24-7, just call the main number to the clinic [phone] and do not press any options, hold  on the line and a nurse will answer the phone.    For prescription refill requests, have your pharmacy contact our office and allow 72 hours.    Due to Covid, you will need to wear a mask upon entering the hospital. If you do not have a mask, a mask will be given to you at the Main Entrance upon arrival. For doctor visits, patients may have 1 support person age 79 or older with them. For treatment visits, patients can not have anyone with them due to social distancing guidelines and our immunocompromised population.

## 2021-08-15 NOTE — Telephone Encounter (Signed)
Oral Oncology Pharmacist Encounter  Received new prescription for Lynparza (olaparib) for the maintenance treatment of stage IVa high-grade serous ovarian cancer, HRD+, planned duration of 2 years.  CMP from 08/09/21 assessed, no relevant lab abnormalities. Prescription dose and frequency assessed.   Current medication list in Epic reviewed, no DDIs with olaparib identified.  Evaluated chart and no patient barriers to medication adherence identified.   Prescription has been e-scribed to the [facility] for benefits analysis and approval.  Oral Oncology Clinic will continue to follow for insurance authorization, copayment issues, initial counseling and start date.  Patient agreed to treatment on 08/15/21 per MD documentation.  Darl Pikes, PharmD, BCPS, BCOP, CPP Hematology/Oncology Clinical Pharmacist Practitioner [place]/DB/AP Oral [facility] [phone]  08/17/2021 12:26 PM

## 2021-08-15 NOTE — Telephone Encounter (Signed)
Oral Oncology Patient Advocate Encounter  Prior Authorization for Michelle Holt has been approved.    PA# [id number] Effective dates: 08/15/2021 through 12/31/20233  Patients co-pay is $0.    Michelle Holt, CPhT-Adv Pharmacy Patient [place] Patient Advocate Team Direct Number: [phone]  Fax: [phone]

## 2021-08-15 NOTE — Progress Notes (Signed)
[place], [place] [address]   CLINIC:  Medical Oncology/Hematology  PCP:  [name], MD [address] / [place] [address] [phone]   REASON FOR VISIT:  Follow-up for high-grade serous ovarian cancer  PRIOR THERAPY:  1. Carboplatin and paclitaxel x 7 cycles from 08/24/2018 to 03/15/2019. 2. Laparoscopic TAH & BSO & omenectomy on 12/24/2018. 3.  6 cycles of carboplatin and paclitaxel from 03/05/2021 to 06/18/2021  NGS Results: not done  CURRENT THERAPY: olaparib  BRIEF ONCOLOGIC HISTORY:  Oncology History  Ovarian cancer, bilateral ([place])  07/07/2018 Pathology Results   PLEURAL FLUID, LEFT (SPECIMEN 1 OF 1, COLLECTED 07/07/18): - MALIGNANT CELLS CONSISTENT WITH ADENOCARCINOMA - SEE COMMENT  Source Pleural Fluid, (Specimen 1 of 1, collected on 07/07/2018) Gross Specimen: Received is/are 1000cc of bloody red fluid with tissue. (TC:tc) Prepared: # Smears: 0 # Concentration Technique Slides (i.e. ThinPrep): 1 # Cell Block: 1 Conventional Additional Studies: Two Hematology slides labeled [id number] Comment The malignant cells are positive for cytokeratin 7, p53, WT-1, Pax-8, Moc31, ER (weak) and EMA but negative for cytokeratin 20, TTF-1, GATA-3, CDX-2 and D2-40. Overall, the phenotype is consistent with a gynecologic primary; clinical correlation recommended.   07/07/2018 Procedure   Successful ultrasound guided left thoracentesis yielding 2.0 L of pleural fluid   07/08/2018 Procedure   1. Technically successful placement of left 14 French pigtail chest drain, placed to Pleur-evac water-seal.   07/08/2018 Procedure   1. Technically successful five [place] double lumen power injectable PICC placement   07/09/2018 Imaging   Ct chest 1. There is a moderate, loculated left hydropneumothorax with a small air component and moderate fluid component. The largest loculated component is located posteriorly. There is a pigtail drainage catheter  about the lateral pleural space. There is no obvious etiology, such as obvious mass or pleural disease.   2. There is a small right pleural effusion with associated atelectasis or consolidation and a subpleural consolidation of the superior segment right lower lobe (series 4, image 56), of uncertain significance, possibly infectious or inflammatory   07/10/2018 Imaging   Ct abdomen and pelvis: 1. The bilateral ovaries are enlarged by heterogeneous appearing cystic lesions, measuring 5.3 x 4.2 cm on the right (series 4, image 72) and 4.5 x 3.2 cm on the left (series 4, image 75). Consider dedicated pelvic ultrasound and/or pelvic MRI to further evaluate for solid components given high suspicion for GYN primary malignancy.   2. No other evidence of mass and no lymphadenopathy in the abdomen or pelvis.   3. Trace ascites. There is some suggestion of omental and peritoneal nodularity (e.g. Series 4, image 28), concerning for peritoneal metastatic disease.    4. Loculated left-sided pleural effusion with left-sided pleural drainage catheter in position. Small right pleural effusion   07/13/2018 Surgery   OPERATION: 1.  Left VATS (video-assisted thoracoscopic surgery) for drainage of loculated pleural effusion. 2.  Talc pleurodesis for malignant pleural effusion. 3.  Placement of PleurX catheter for management of malignant pleural effusion. 4.  Placement of On-Q analgesia catheter system.    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   07/13/2018 Pathology Results   Pleura, peel, Left Pleural - FIBRO-FIBRINOUS PLEURITIS - NEGATIVE FOR MALIGNANCY   07/18/2018 Initial Diagnosis   Ovarian cancer, bilateral ([facility])   07/20/2018 Procedure  EGD impression: Normal proximal esophagus and mid esophagus. Mild distal  esophageal rings; dilation not performed because of esophagitis. LA Grade C reflux esophagitis. Z-line regular, 30 cm from the incisors. 5 cm hiatal hernia. Non-bleeding gastric ulcer with no stigmata of bleeding. Gastritis. Duodenal erosions without bleeding. Normal second portion of the duodenum. No specimens collected.   08/19/2018 Genetic Testing   Negative genetic testing on the common hereditary cancer panel.  The Common Hereditary Gene Panel offered by [facility] includes sequencing and/or deletion duplication testing of the following 48 genes: APC, ATM, AXIN2, BARD1, BMPR1A, BRCA1, BRCA2, BRIP1, CDH1, CDK4, CDKN2A (p14ARF), CDKN2A (p16INK4a), CHEK2, CTNNA1, DICER1, EPCAM (Deletion/duplication testing only), GREM1 (promoter region deletion/duplication testing only), KIT, MEN1, MLH1, MSH2, MSH3, MSH6, MUTYH, NBN, NF1, NHTL1, PALB2, PDGFRA, PMS2, POLD1, POLE, PTEN, RAD50, RAD51C, RAD51D, RNF43, SDHB, SDHC, SDHD, SMAD4, SMARCA4. STK11, TP53, TSC1, TSC2, and VHL.  The following genes were evaluated for sequence changes only: SDHA and HOXB13 c.251G>A variant only. The report date is August 19, 2018.   08/24/2018 - 03/15/2019 Chemotherapy   Patient is on Treatment Plan : OVARIAN Carboplatin (AUC 6) / Paclitaxel (175) q21d x 6 cycles     03/05/2021 -  Chemotherapy   Patient is on Treatment Plan : OVARIAN Carboplatin (AUC 6) / Paclitaxel (175) q21d x 6 cycles       CANCER STAGING: Cancer Staging  No matching staging information was found for the patient.  INTERVAL HISTORY:  [name], a 51 y.o. female, returns for routine follow-up of her high-grade serous ovarian cancer. [name] was last seen on 07/18/2021.   Today she reports feeling well. She reports hematuria. The tingling in her right hand occurring at night is stable.   REVIEW OF SYSTEMS:  Review of Systems  Constitutional:  Positive for appetite change. Negative for fatigue.  Respiratory:  Positive for shortness of breath.    Cardiovascular:  Positive for chest pain.  Gastrointestinal:  Positive for diarrhea, nausea and vomiting.  Genitourinary:  Positive for hematuria.   Musculoskeletal:  Positive for arthralgias (6/10).  Neurological:  Positive for dizziness and numbness (R hand).  All other systems reviewed and are negative.   PAST MEDICAL/SURGICAL HISTORY:  Past Medical History:  Diagnosis Date   Anemia    Anxiety and depression    Arthritis of facet joints at multiple vertebral levels    L5-S1   Constipation    Dyslipidemia    Family history of breast cancer    Family history of uterine cancer    GERD (gastroesophageal reflux disease)    History of hiatal hernia    History of kidney stones    Insomnia    Irritable bowel syndrome    Migraine    Muscle tension headache    Neuropathy of finger    Ovarian carcinoma ([facility])    ovarian   Plantar fasciitis of right foot    Port-A-Cath in place 08/20/2018   Past Surgical History:  Procedure Laterality Date   ABDOMINAL HYSTERECTOMY     BIOPSY  10/27/2020   Procedure: BIOPSY;  Surgeon: Harvel Quale, MD;  [facility];  Service: Gastroenterology;;   CHOLECYSTECTOMY  2008   COLONOSCOPY N/A 08/13/2013   Procedure: COLONOSCOPY;  Surgeon: Rogene Houston, MD;  [facility];  Service: Endoscopy;  Laterality: N/A;  230-moved to 145 Ann to notify pt   COLONOSCOPY WITH PROPOFOL N/A 11/15/2020   Procedure: COLONOSCOPY WITH PROPOFOL;  Surgeon: Rogene Houston, MD;  [facility];  Service: Endoscopy;  Laterality: N/A;  1:40   ESOPHAGEAL DILATION N/A 02/28/2021   Procedure: ESOPHAGEAL DILATION;  Surgeon: Rogene Houston, MD;  [facility];  Service: Endoscopy;  Laterality: N/A;   ESOPHAGOGASTRODUODENOSCOPY     ESOPHAGOGASTRODUODENOSCOPY (EGD) WITH PROPOFOL N/A 07/20/2018   Procedure: ESOPHAGOGASTRODUODENOSCOPY (EGD) WITH PROPOFOL;  Surgeon: Rogene Houston, MD;  [facility];  Service:  Endoscopy;  Laterality: N/A;  Possible esophageal dilation.   ESOPHAGOGASTRODUODENOSCOPY (EGD) WITH PROPOFOL N/A 10/27/2020   Procedure: ESOPHAGOGASTRODUODENOSCOPY (EGD) WITH PROPOFOL;  Surgeon: Harvel Quale, MD;  [facility];  Service: Gastroenterology;  Laterality: N/A;  2:10, pt knows to arrive at 10:15   ESOPHAGOGASTRODUODENOSCOPY (EGD) WITH PROPOFOL N/A 02/28/2021   Procedure: ESOPHAGOGASTRODUODENOSCOPY (EGD) WITH PROPOFOL;  Surgeon: Rogene Houston, MD;  [facility];  Service: Endoscopy;  Laterality: N/A;  200 ASA 1   GIVENS CAPSULE STUDY N/A 11/24/2020   Procedure: GIVENS CAPSULE STUDY;  Surgeon: Rogene Houston, MD;  [facility];  Service: Endoscopy;  Laterality: N/A;  7:30   IR ANGIOGRAM SELECTIVE EACH ADDITIONAL VESSEL  08/01/2018   IR ANGIOGRAM VISCERAL SELECTIVE  08/01/2018   IR EMBO ART  VEN HEMORR LYMPH EXTRAV  INC GUIDE ROADMAPPING  08/01/2018   IR IMAGING GUIDED PORT INSERTION  08/20/2018   IR PERC PLEURAL DRAIN W/INDWELL CATH W/IMG GUIDE  07/08/2018   IR THORACENTESIS ASP PLEURAL SPACE W/IMG GUIDE  07/07/2018   IR US GUIDE VASC ACCESS RIGHT  08/01/2018   PLEURAL EFFUSION DRAINAGE Left 07/13/2018   Procedure: DRAINAGE OF LOCULATED PLEURAL EFFUSION;  Surgeon: Ivin Poot, MD;  Location: [facility];  Service: Thoracic;  Laterality: Left;   POLYPECTOMY  11/15/2020   Procedure: POLYPECTOMY;  Surgeon: Rogene Houston, MD;  [facility];  Service: Endoscopy;;   REMOVAL OF PLEURAL DRAINAGE CATHETER Left 08/20/2018   Procedure: REMOVAL OF PLEURAL DRAINAGE CATHETER;  Surgeon: Ivin Poot, MD;  Location: [facility];  Service: Thoracic;  Laterality: Left;   REMOVAL OF PLEURAL DRAINAGE CATHETER Left 08/20/2018   Procedure: REMOVAL OF PLEURAL DRAINAGE CATHETER;  Surgeon: Ivin Poot, MD;  Location: [facility];  Service: Thoracic;  Laterality: Left;   TALC PLEURODESIS Left 07/13/2018   Procedure: Talc Pleuradesis;  Surgeon: Prescott Gum, Collier Salina, MD;   Location: [facility];  Service: Thoracic;  Laterality: Left;   TOTAL HIP ARTHROPLASTY Left 06/05/2020   Procedure: LEFT TOTAL HIP ARTHROPLASTY ANTERIOR APPROACH;  Surgeon: Leandrew Koyanagi, MD;  Location: [facility];  Service: Orthopedics;  Laterality: Left;  3-C   TUBAL LIGATION Bilateral    UTERINE ABLATION     VIDEO ASSISTED THORACOSCOPY Left 07/13/2018   Procedure: VIDEO ASSISTED THORACOSCOPY;  Surgeon: Ivin Poot, MD;  Location: [facility];  Service: Thoracic;  Laterality: Left;    SOCIAL HISTORY:  Social History   Socioeconomic History   Marital status: Widowed    Spouse name: Not on file   Number of children: 2   Years of education: 2-College   Highest education level: Not on file  Occupational History    Employer: [employer]  Tobacco Use   Smoking status: Former    Packs/day: 0.50    Years: 17.00    Total pack years: 8.50    Types: Cigarettes    Quit date: 06/22/2018    Years since quitting: 3.1   Smokeless tobacco: Never  Vaping Use   Vaping Use: Never used  Substance  and Sexual Activity   Alcohol use: Not Currently    Alcohol/week: 1.0 standard drink of alcohol    Types: 1 Glasses of wine per week   Drug use: No   Sexual activity: Not on file  Other Topics Concern   Not on file  Social History Narrative   Patient lives at home with her daughter.    Patient has 2 children.    Patient is widowed.    Patient is right handed.    Patient has her Associates degree.      Social Determinants of Health   Financial Resource Strain: Low Risk  (07/22/2018)   Overall Financial Resource Strain (CARDIA)    Difficulty of Paying Living Expenses: Not hard at all  Food Insecurity: No Food Insecurity (07/22/2018)   Hunger Vital Sign    Worried About Running Out of Food in the Last Year: Never true    Ran Out of Food in the Last Year: Never true  Transportation Needs: No Transportation Needs (07/22/2018)   PRAPARE - Hydrologist (Medical): No    Lack of  Transportation (Non-Medical): No  Physical Activity: Inactive (07/22/2018)   Exercise Vital Sign    Days of Exercise per Week: 0 days    Minutes of Exercise per Session: 0 min  Stress: Stress Concern Present (07/22/2018)   [place]    Feeling of Stress : Very much  Social Connections: Moderately Isolated (07/22/2018)   Social Connection and Isolation Panel [NHANES]    Frequency of Communication with Friends and Family: More than three times a week    Frequency of Social Gatherings with Friends and Family: More than three times a week    Attends Religious Services: 1 to 4 times per year    Active Member of [employer] or Organizations: No    Attends Archivist Meetings: Never    Marital Status: Widowed  Intimate Partner Violence: Not At Risk (07/22/2018)   Humiliation, Afraid, Rape, and Kick questionnaire    Fear of Current or Ex-Partner: No    Emotionally Abused: No    Physically Abused: No    Sexually Abused: No    FAMILY HISTORY:  Family History  Problem Relation Age of Onset   Hypertension Mother    Obesity Mother    Diabetes Mother    Kidney disease Mother    Peripheral vascular disease Father    Atrial fibrillation Father    COPD Brother    Osteoporosis Brother    Crohn's disease Sister    Uterine cancer Sister 56       maternal half sister   Breast cancer Maternal Aunt 48   Colon cancer Neg Hx     CURRENT MEDICATIONS:  Current Outpatient Medications  Medication Sig Dispense Refill   albuterol (VENTOLIN HFA) 108 (90 Base) MCG/ACT inhaler Inhale 2 puffs into the lungs every 6 (six) hours as needed for wheezing or shortness of breath.     B Complex-C (B-COMPLEX WITH VITAMIN C) tablet Take 1 tablet by mouth daily.     Cholecalciferol (VITAMIN D) 50 MCG (2000 UT) CAPS Take 2,000 Units by mouth daily.     dexamethasone (DECADRON) 4 MG tablet Take 1 tablet (4 mg total) by mouth daily. Three days after each  treatment 30 tablet 2   diazepam (VALIUM) 5 MG tablet TAKE ONE TABLET BY MOUTH AT BEDTIME (Patient taking differently: Take 5 mg by mouth at bedtime.)  30 tablet 5   dicyclomine (BENTYL) 10 MG capsule Take 1 capsule (10 mg total) by mouth 3 (three) times daily as needed for spasms. 90 capsule 3   folic acid (FOLVITE) 1 MG tablet Take 1 tablet (1 mg total) by mouth daily. 30 tablet 1   gabapentin (NEURONTIN) 100 MG capsule TAKE TWO (2) CAPSULES BY MOUTH AT BEDTIME (Patient taking differently: Take 100 mg by mouth daily as needed (for nerve pain).) 60 capsule 2   guaiFENesin (MUCINEX) 600 MG 12 hr tablet Take 1 tablet (600 mg total) by mouth 2 (two) times daily. 10 tablet 0   lactulose (CHRONULAC) 10 GM/15ML solution TAKE ONE TABLESPOONFUL (15ML) EVERY 3 HOURS UNTIL BOWEL MOVEMENT ACHIEVED THEN TAKEN DAILY AS NEEDED 480 mL 6   lidocaine-prilocaine (EMLA) cream Apply 1 application. topically as needed (pain).     linaclotide (LINZESS) 290 MCG CAPS capsule Take 1 capsule (290 mcg total) by mouth daily before breakfast. (Patient taking differently: Take 290 mcg by mouth daily as needed (if no BM after a week).) 30 capsule 6   LORazepam (ATIVAN) 0.5 MG tablet Take 0.5 mg by mouth 3 (three) times daily as needed.     magic mouthwash w/lidocaine SOLN Take 5 mLs by mouth 3 (three) times daily as needed for mouth pain (Swish and spit). 480 mL 6   Magnesium 500 MG TABS Take 500 mg by mouth in the morning.     mirabegron ER (MYRBETRIQ) 25 MG TB24 tablet Take 1 tablet (25 mg total) by mouth daily. 28 tablet 0   omeprazole (PRILOSEC) 40 MG capsule Take 1 capsule (40 mg total) by mouth 2 (two) times daily before a meal. 60 capsule 5   ondansetron (ZOFRAN) 4 MG tablet Take 1 tablet (4 mg total) by mouth every 8 (eight) hours as needed for nausea or vomiting. 40 tablet 3   oxycodone (ROXICODONE) 30 MG immediate release tablet Take 1 tablet (30 mg total) by mouth every 6 (six) hours as needed. 116 tablet 0   predniSONE  (DELTASONE) 10 MG tablet Prednisone 40 mg po daily x 1 day then Prednisone 30 mg po daily x 1 day then Prednisone 20 mg po daily x 1 day then Prednisone 10 mg daily x 1 day then stop... 10 tablet 0   sennosides-docusate sodium (SENOKOT-S) 8.6-50 MG tablet Take 1 tablet by mouth 2 (two) times daily.     SUMAtriptan (IMITREX) 100 MG tablet TAKE ONE TABLET BY MOUTH PRN UP to TWICE DAILY AS NEEDED. (Patient taking differently: Take 100 mg by mouth 2 (two) times daily as needed for migraine.) 9 tablet 5   tiZANidine (ZANAFLEX) 4 MG tablet Take 1 tablet (4 mg total) by mouth 3 (three) times daily as needed for muscle spasms. (Patient taking differently: Take 4 mg by mouth daily as needed for muscle spasms.) 60 tablet 0   triamcinolone cream (KENALOG) 0.1 % Apply 1 application topically 2 (two) times daily as needed (rash). 80 g 11   venlafaxine XR (EFFEXOR-XR) 150 MG 24 hr capsule TAKE ONE CAPSULE BY MOUTH DAILY WITH BREAKFAST (Patient taking differently: Take 150 mg by mouth daily with breakfast.) 30 capsule 6   No current facility-administered medications for this visit.   Facility-Administered Medications Ordered in Other Visits  Medication Dose Route Frequency Provider Last Rate Last Admin   0.9 %  sodium chloride infusion   Intravenous Continuous Derek Jack, MD   Stopped at 10/23/20 1547   sodium chloride flush (NS) 0.9 %  injection 10 mL  10 mL Intravenous PRN Derek Jack, MD   10 mL at 10/23/20 1544    ALLERGIES:  Allergies  Allergen Reactions   Morphine And Related Itching   Nickel Itching   Nortriptyline Other (See Comments)    Significant weight gain   Topamax [Topiramate] Diarrhea and Nausea Only   Xanax [Alprazolam]     Can't wake up    Actifed Cold-Allergy [Chlorpheniramine-Phenyleph Er] Rash    Red dye only   Amoxicillin Rash    Did it involve swelling of the face/tongue/throat, SOB, or low BP? Unknown Did it involve sudden or severe rash/hives, skin peeling, or  any reaction on the inside of your mouth or nose? Unknown Did you need to seek medical attention at a hospital or doctor's office? Unknown When did it last happen? teenager       If all above answers are "NO", may proceed with cephalosporin use.    Codeine Hives   Erythromycin Rash   Penicillins Rash    Did it involve swelling of the face/tongue/throat, SOB, or low BP? Unknown Did it involve sudden or severe rash/hives, skin peeling, or any reaction on the inside of your mouth or nose? Unknown Did you need to seek medical attention at a hospital or doctor's office? Unknown When did it last happen? teenager       If all above answers are "NO", may proceed with cephalosporin use.    Sudafed [Pseudoephedrine Hcl] Rash    Red dye only    PHYSICAL EXAM:  Performance status (ECOG): 1 - Symptomatic but completely ambulatory  There were no vitals filed for this visit. Wt Readings from Last 3 Encounters:  08/02/21 190 lb (86.2 kg)  07/18/21 190 lb (86.2 kg)  06/23/21 188 lb 11.4 oz (85.6 kg)   Physical Exam Vitals reviewed.  Constitutional:      Appearance: Normal appearance. She is obese.  Cardiovascular:     Rate and Rhythm: Normal rate and regular rhythm.     Pulses: Normal pulses.     Heart sounds: Normal heart sounds.  Pulmonary:     Effort: Pulmonary effort is normal.     Breath sounds: Normal breath sounds.  Neurological:     General: No focal deficit present.     Mental Status: She is alert and oriented to person, place, and time.  Psychiatric:        Mood and Affect: Mood normal.        Behavior: Behavior normal.      LABORATORY DATA:  I have reviewed the labs as listed.     Latest Ref Rng & Units 08/09/2021    3:11 PM 07/11/2021    1:54 PM 06/25/2021    3:05 AM  CBC  WBC 4.0 - 10.5 K/uL 6.3  4.0  3.3   Hemoglobin 12.0 - 15.0 g/dL 11.7  9.3  8.7   Hematocrit 36.0 - 46.0 % 35.3  28.1  26.3   Platelets 150 - 400 K/uL 310  142  71       Latest Ref Rng & Units  08/09/2021    3:11 PM 07/11/2021    1:54 PM 06/24/2021    3:30 AM  CMP  Glucose 70 - 99 mg/dL 99  149  208   BUN 6 - 20 mg/dL $Remove'8  8  19   'etCtWps$ Creatinine 0.44 - 1.00 mg/dL 0.57  0.68  0.61   Sodium 135 - 145 mmol/L 138  135  139  Potassium 3.5 - 5.1 mmol/L 3.5  3.7  4.5   Chloride 98 - 111 mmol/L 103  99  106   CO2 22 - 32 mmol/L $RemoveB'28  25  24   'wmwECagf$ Calcium 8.9 - 10.3 mg/dL 9.4  8.8  9.3   Total Protein 6.5 - 8.1 g/dL 6.8  6.6  6.9   Total Bilirubin 0.3 - 1.2 mg/dL 0.3  0.4  0.5   Alkaline Phos 38 - 126 U/L 89  96  77   AST 15 - 41 U/L $Remo'23  24  18   'OYwlD$ ALT 0 - 44 U/L $Remo'19  24  17     'hSfrJ$ DIAGNOSTIC IMAGING:  I have independently reviewed the scans and discussed with the patient. XR KNEE 3 VIEW RIGHT  Result Date: 08/01/2021 No acute or structural abnormalities.  Mild osteoarthritis with periarticular spurring.  XR Pelvis 1-2 Views  Result Date: 08/01/2021 Stable total hip replacement without complications    ASSESSMENT:  1.  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 -Robotic assisted laparoscopic TAH and BSO and omentectomy on 12/24/2018, pathology showing high-grade serous carcinoma, PT3P NX. -Germline mutation testing was negative. -3 more cycles of adjuvant chemotherapy completed on 03/15/2019. -CTAP on 04/13/2019 showed no findings of active malignancy.  28% reduction in the volume of presumed chronic hematoma/chronic fluid collection splaying the upper margin of the spleen.  Large type III hiatal hernia. -CTAP on 10/13/2019 shows no findings of recurrence or metastatic disease. - Foundation 1 shows HRD+, LOH score>16%.  MSI-stable.  MYC amplification.  T p53 mutation. - We reviewed CT CAP from 02/21/2021 which showed progressive peritoneal metastasis compared to 12/06/2020 scan.  No bowel obstruction.  Small right pleural effusion with mild enlargement of an isolated left  external iliac lymph node. - Reviewed EGD from 02/28/2021 which showed hiatal hernia and normal findings. - She reported epigastric and left upper quadrant pain worse in the last 1 week which is related to progression of her malignancy. - Cycle 1 of carboplatin and paclitaxel started on 03/05/2021.    PLAN:  1.  Clinical stage IVa high-grade serous ovarian cancer, positive cytology of left pleural effusion: -CT CAP (07/12/2021): No evidence of recurrence or metastatic disease in the chest, abdomen or pelvis.  Previously seen peritoneal, omental and nodal metastatic disease is resolved. - She has HRD positivity on NGS testing. - Reviewed labs today which shows normal LFTs and creatinine.  CBC was grossly normal.  Last CA125 was normal. - She reportedly had nausea and vomiting yesterday and is feeling weak today. - We talked about starting her on maintenance olaparib 300 mg twice daily until progression or intolerance.  We discussed side effects in detail including fatigue, cytopenias, diarrhea, skin rashes, increased serum creatinine among others.  She is agreeable to initiate maintenance. - We will send prescription to [facility] outpatient pharmacy.  I will see her back in 2 weeks after starting olaparib with repeat labs for toxicity assessment.   2.  Lower back/left upper quadrant pain: -She reports that she cannot tolerate oxycodone 30 mg due to drowsiness. - We will change to 20 mg every 6 hours as needed.   3.  Macrocytic anemia: -Continue folic acid daily.  Hemoglobin improved to 11.7.  4.  Iron deficiency state: -Hemoglobin today is 11.7.  Last Feraheme on 02/19/2021.  5.  Constipation: -She is no longer constipated.  Not requiring Linzess.  6.  Hypomagnesemia: -Magnesium is low at 1.5.  She is taking OTC supplements.  We will start her on magnesium prescription 400 mg twice daily.   Orders placed this encounter:  No orders of the defined types were placed in this  encounter.    Derek Jack, MD [place] [phone]   I, Thana Ates, am acting as a scribe for Dr. Derek Jack.  I, Derek Jack MD, have reviewed the above documentation for accuracy and completeness, and I agree with the above.

## 2021-08-15 NOTE — Telephone Encounter (Signed)
Oral Oncology Patient Advocate Encounter   Received notification that prior authorization for Michelle Holt is required.   PA submitted on 08/15/2021 Key [id number] Status is pending     Lady Deutscher, CPhT-Adv Pharmacy Patient Advocate Specialist [facility] Patient Advocate Team Direct Number: [phone]  Fax: [phone]

## 2021-08-20 ENCOUNTER — Other Ambulatory Visit (HOSPITAL_COMMUNITY): Payer: Self-pay

## 2021-08-20 NOTE — Telephone Encounter (Signed)
Oral Chemotherapy Pharmacist Encounter  [facility] will deliver medication to [name] on 08/21/21. She will get started when she has medication in hand.   Patient Education I spoke with patient for overview of new oral chemotherapy medication: Lynparza (olaparib) for the maintenance treatment of stage IVa high-grade serous ovarian cancer, HRD+, planned duration of 2 years.   Pt is doing well. Counseled patient on administration, dosing, side effects, monitoring, drug-food interactions, safe handling, storage, and disposal. Patient will take 2 tablets (300 mg total) by mouth 2 (two) times daily. Swallow whole. May take with food to decrease nausea and vomiting.  Side effects include but not limited to: nausea, diarrhea, fatigue, decrease wbc.   Nausea: Patient reports no real issues with nausea during her time on IV chemotherapy. She knows that taking her olaparib with food can decrease the risk of nausea. She has ondansetron at home to use as needed. Due to the moderate-high emetic risk  of olaparib, instructed her to begin taking the ondansetron 30-60 mins prior to olaparib doses is she is feeling nauseous.   Reviewed with patient importance of keeping a medication schedule and plan for any missed doses.  After discussion with patient no patient barriers to medication adherence identified.   [name] voiced understanding and appreciation. All questions answered. Medication handout provided.  Provided patient with [facility] phone number. Patient knows to call the office with questions or concerns. Oral [facility] will continue to follow.  Darl Pikes, PharmD, BCPS, BCOP, CPP Hematology/Oncology Clinical Pharmacist Practitioner [place]/[facility] [phone]  08/20/2021 9:53 AM

## 2021-08-24 ENCOUNTER — Other Ambulatory Visit (HOSPITAL_COMMUNITY): Payer: Self-pay | Admitting: Hematology

## 2021-08-24 DIAGNOSIS — R232 Flushing: Secondary | ICD-10-CM

## 2021-08-25 ENCOUNTER — Other Ambulatory Visit (HOSPITAL_COMMUNITY): Payer: Self-pay

## 2021-08-27 ENCOUNTER — Encounter: Payer: Self-pay | Admitting: Hematology

## 2021-08-27 ENCOUNTER — Encounter (HOSPITAL_COMMUNITY): Payer: Self-pay | Admitting: Hematology

## 2021-08-30 ENCOUNTER — Ambulatory Visit: Payer: Medicare Other | Admitting: Urology

## 2021-08-30 NOTE — Progress Notes (Deleted)
Assessment: 1. Mixed stress and urge urinary incontinence   2. Microscopic hematuria      Plan: Diagnosis and management of stress and urge incontinence discussed with the patient.  Options for management of stress incontinence including pelvic floor exercises, incontinence pessaries, and surgical management discussed.  Options for management of urge incontinence including avoidance of dietary irritants, behavioral modification, medical therapy, neuromodulation, and chemodenervation discussed.  Information provided to the patient. Bladder diet sheet given. Instructions on Kegel exercises provided. Discussed timed and double voiding during the daytime hours. Today I had a discussion with the patient regarding the findings of microscopic hematuria including the implications and differential diagnoses associated with it.  I also discussed recommendations for further evaluation including the rationale for upper tract imaging and cystoscopy.  I discussed the nature of these procedures including potential risk and complications.  The patient expressed an understanding of these issues. I reviewed the patient's recent CT scan from 07/12/2021. Will repeat U/A next visit. Return to office in 3-4 weeks   Chief Complaint:  No chief complaint on file.   History of Present Illness:  Michelle Holt is a 51 y.o. year old female who is seen for further evaluation of urinary incontinence and microscopic hematuria.   She is status post a abdominal hysterectomy for treatment of ovarian cancer in 2020.  She has recently completed a course of chemotherapy as well.  She has had symptoms of stress incontinence for approximately 1 year.  She reports incontinence associated with coughing.  This does not occur on a daily basis.  She has also noted increased nocturia 5-6 times per night and urgency and urge incontinence at night.  She voids 1 time per day despite drinking fluids throughout the day.  She is using  pantiliners during the day.  No dysuria or gross hematuria.  No recent UTIs.  No prior treatment for incontinence.  She denies any fecal incontinence. Pelvic exam demonstrated no significant prolapse and no obvious stress incontinence.  CT chest abdomen and pelvis from 07/12/2021 showed no renal masses, no renal calculi, no obstruction, and a normal-appearing bladder.  There was no evidence of recurrent or metastatic disease. Urinalysis from 7/23 showed 3-10 RBCs.  Past Medical History:  Past Medical History:  Diagnosis Date   Anemia    Anxiety and depression    Arthritis of facet joints at multiple vertebral levels    L5-S1   Constipation    Dyslipidemia    Family history of breast cancer    Family history of uterine cancer    GERD (gastroesophageal reflux disease)    History of hiatal hernia    History of kidney stones    Insomnia    Irritable bowel syndrome    Migraine    Muscle tension headache    Neuropathy of finger    Ovarian carcinoma (HCC)    ovarian   Plantar fasciitis of right foot    Port-A-Cath in place 08/20/2018    Past Surgical History:  Past Surgical History:  Procedure Laterality Date   ABDOMINAL HYSTERECTOMY     BIOPSY  10/27/2020   Procedure: BIOPSY;  Surgeon: Harvel Quale, MD;  [facility];  Service: Gastroenterology;;   CHOLECYSTECTOMY  2008   COLONOSCOPY N/A 08/13/2013   Procedure: COLONOSCOPY;  Surgeon: Rogene Houston, MD;  [facility];  Service: Endoscopy;  Laterality: N/A;  230-moved to 145 Ann to notify pt   COLONOSCOPY WITH PROPOFOL N/A 11/15/2020   Procedure:  COLONOSCOPY WITH PROPOFOL;  Surgeon: Rogene Houston, MD;  [facility];  Service: Endoscopy;  Laterality: N/A;  1:40   ESOPHAGEAL DILATION N/A 02/28/2021   Procedure: ESOPHAGEAL DILATION;  Surgeon: Rogene Houston, MD;  [facility];  Service: Endoscopy;  Laterality: N/A;   ESOPHAGOGASTRODUODENOSCOPY     ESOPHAGOGASTRODUODENOSCOPY  (EGD) WITH PROPOFOL N/A 07/20/2018   Procedure: ESOPHAGOGASTRODUODENOSCOPY (EGD) WITH PROPOFOL;  Surgeon: Rogene Houston, MD;  [facility];  Service: Endoscopy;  Laterality: N/A;  Possible esophageal dilation.   ESOPHAGOGASTRODUODENOSCOPY (EGD) WITH PROPOFOL N/A 10/27/2020   Procedure: ESOPHAGOGASTRODUODENOSCOPY (EGD) WITH PROPOFOL;  Surgeon: Harvel Quale, MD;  [facility];  Service: Gastroenterology;  Laterality: N/A;  2:10, pt knows to arrive at 10:15   ESOPHAGOGASTRODUODENOSCOPY (EGD) WITH PROPOFOL N/A 02/28/2021   Procedure: ESOPHAGOGASTRODUODENOSCOPY (EGD) WITH PROPOFOL;  Surgeon: Rogene Houston, MD;  [facility];  Service: Endoscopy;  Laterality: N/A;  200 ASA 1   GIVENS CAPSULE STUDY N/A 11/24/2020   Procedure: GIVENS CAPSULE STUDY;  Surgeon: Rogene Houston, MD;  [facility];  Service: Endoscopy;  Laterality: N/A;  7:30   IR ANGIOGRAM SELECTIVE EACH ADDITIONAL VESSEL  08/01/2018   IR ANGIOGRAM VISCERAL SELECTIVE  08/01/2018   IR EMBO ART  VEN HEMORR LYMPH EXTRAV  INC GUIDE ROADMAPPING  08/01/2018   IR IMAGING GUIDED PORT INSERTION  08/20/2018   IR PERC PLEURAL DRAIN W/INDWELL CATH W/IMG GUIDE  07/08/2018   IR THORACENTESIS ASP PLEURAL SPACE W/IMG GUIDE  07/07/2018   IR US GUIDE VASC ACCESS RIGHT  08/01/2018   PLEURAL EFFUSION DRAINAGE Left 07/13/2018   Procedure: DRAINAGE OF LOCULATED PLEURAL EFFUSION;  Surgeon: Ivin Poot, MD;  Location: [place];  Service: Thoracic;  Laterality: Left;   POLYPECTOMY  11/15/2020   Procedure: POLYPECTOMY;  Surgeon: Rogene Houston, MD;  [facility];  Service: Endoscopy;;   REMOVAL OF PLEURAL DRAINAGE CATHETER Left 08/20/2018   Procedure: REMOVAL OF PLEURAL DRAINAGE CATHETER;  Surgeon: Ivin Poot, MD;  Location: [place];  Service: Thoracic;  Laterality: Left;   REMOVAL OF PLEURAL DRAINAGE CATHETER Left 08/20/2018   Procedure: REMOVAL OF PLEURAL DRAINAGE CATHETER;  Surgeon: Ivin Poot, MD;  Location: [place];  Service: Thoracic;  Laterality: Left;   TALC PLEURODESIS Left 07/13/2018   Procedure: Talc Pleuradesis;  Surgeon: Prescott Gum, Collier Salina, MD;  Location: [place];  Service: Thoracic;  Laterality: Left;   TOTAL HIP ARTHROPLASTY Left 06/05/2020   Procedure: LEFT TOTAL HIP ARTHROPLASTY ANTERIOR APPROACH;  Surgeon: Leandrew Koyanagi, MD;  Location: [place];  Service: Orthopedics;  Laterality: Left;  3-C   TUBAL LIGATION Bilateral    UTERINE ABLATION     VIDEO ASSISTED THORACOSCOPY Left 07/13/2018   Procedure: VIDEO ASSISTED THORACOSCOPY;  Surgeon: Ivin Poot, MD;  Location: [place];  Service: Thoracic;  Laterality: Left;    Allergies:  Allergies  Allergen Reactions   Morphine And Related Itching   Nickel Itching   Nortriptyline Other (See Comments)    Significant weight gain   Topamax [Topiramate] Diarrhea and Nausea Only   Xanax [Alprazolam]     Can't wake up    Actifed Cold-Allergy [Chlorpheniramine-Phenyleph Er] Rash    Red dye only   Amoxicillin Rash    Did it involve swelling of the face/tongue/throat, SOB, or low BP? Unknown Did it involve sudden or severe rash/hives, skin peeling, or any reaction on the inside of your mouth  or nose? Unknown Did you need to seek medical attention at a hospital or doctor's office? Unknown When did it last happen? teenager       If all above answers are "NO", may proceed with cephalosporin use.    Codeine Hives   Erythromycin Rash   Penicillins Rash    Did it involve swelling of the face/tongue/throat, SOB, or low BP? Unknown Did it involve sudden or severe rash/hives, skin peeling, or any reaction on the inside of your mouth or nose? Unknown Did you need to seek medical attention at a hospital or doctor's office? Unknown When did it last happen? teenager       If all above answers are "NO", may proceed with cephalosporin use.    Sudafed [Pseudoephedrine Hcl] Rash    Red dye only    Family History:  Family History  Problem  Relation Age of Onset   Hypertension Mother    Obesity Mother    Diabetes Mother    Kidney disease Mother    Peripheral vascular disease Father    Atrial fibrillation Father    COPD Brother    Osteoporosis Brother    Crohn's disease Sister    Uterine cancer Sister 40       maternal half sister   Breast cancer Maternal Aunt 79   Colon cancer Neg Hx     Social History:  Social History   Tobacco Use   Smoking status: Former    Packs/day: 0.50    Years: 17.00    Total pack years: 8.50    Types: Cigarettes    Quit date: 06/22/2018    Years since quitting: 3.1   Smokeless tobacco: Never  Vaping Use   Vaping Use: Never used  Substance Use Topics   Alcohol use: Not Currently    Alcohol/week: 1.0 standard drink of alcohol    Types: 1 Glasses of wine per week   Drug use: No    ROS: Constitutional:  Negative for fever, chills, weight loss CV: Negative for chest pain, previous MI, hypertension Respiratory:  Negative for shortness of breath, wheezing, sleep apnea, frequent cough GI:  Negative for nausea, vomiting, bloody stool, GERD  Physical exam: LMP  (LMP Unknown)  GENERAL APPEARANCE:  Well appearing, well developed, well nourished, NAD HEENT:  Atraumatic, normocephalic, oropharynx clear NECK:  Supple without lymphadenopathy or thyromegaly ABDOMEN:  Soft, non-tender, no masses EXTREMITIES:  Moves all extremities well, without clubbing, cyanosis, or edema NEUROLOGIC:  Alert and oriented x 3, normal gait, CN II-XII grossly intact MENTAL STATUS:  appropriate BACK:  Non-tender to palpation, No CVAT SKIN:  Warm, dry, and intact  Results: U/A:

## 2021-09-03 ENCOUNTER — Other Ambulatory Visit (HOSPITAL_COMMUNITY): Payer: Self-pay

## 2021-09-03 ENCOUNTER — Inpatient Hospital Stay: Payer: Medicare Other

## 2021-09-03 ENCOUNTER — Other Ambulatory Visit: Payer: Self-pay | Admitting: *Deleted

## 2021-09-03 DIAGNOSIS — D649 Anemia, unspecified: Secondary | ICD-10-CM

## 2021-09-03 DIAGNOSIS — C563 Malignant neoplasm of bilateral ovaries: Secondary | ICD-10-CM | POA: Diagnosis not present

## 2021-09-03 LAB — CBC WITH DIFFERENTIAL/PLATELET
Abs Immature Granulocytes: 0.01 K/uL (ref 0.00–0.07)
Basophils Absolute: 0 K/uL (ref 0.0–0.1)
Basophils Relative: 1 %
Eosinophils Absolute: 0.2 K/uL (ref 0.0–0.5)
Eosinophils Relative: 4 %
HCT: 33.1 % — ABNORMAL LOW (ref 36.0–46.0)
Hemoglobin: 11 g/dL — ABNORMAL LOW (ref 12.0–15.0)
Immature Granulocytes: 0 %
Lymphocytes Relative: 34 %
Lymphs Abs: 1.9 K/uL (ref 0.7–4.0)
MCH: 39.7 pg — ABNORMAL HIGH (ref 26.0–34.0)
MCHC: 33.2 g/dL (ref 30.0–36.0)
MCV: 119.5 fL — ABNORMAL HIGH (ref 80.0–100.0)
Monocytes Absolute: 0.5 K/uL (ref 0.1–1.0)
Monocytes Relative: 9 %
Neutro Abs: 3 K/uL (ref 1.7–7.7)
Neutrophils Relative %: 52 %
Platelets: 272 K/uL (ref 150–400)
RBC: 2.77 MIL/uL — ABNORMAL LOW (ref 3.87–5.11)
RDW: 13.5 % (ref 11.5–15.5)
WBC: 5.6 K/uL (ref 4.0–10.5)
nRBC: 0.5 % — ABNORMAL HIGH (ref 0.0–0.2)

## 2021-09-03 LAB — COMPREHENSIVE METABOLIC PANEL
ALT: 15 U/L (ref 0–44)
AST: 18 U/L (ref 15–41)
Albumin: 3.6 g/dL (ref 3.5–5.0)
Alkaline Phosphatase: 80 U/L (ref 38–126)
Anion gap: 8 (ref 5–15)
BUN: 10 mg/dL (ref 6–20)
CO2: 27 mmol/L (ref 22–32)
Calcium: 8.9 mg/dL (ref 8.9–10.3)
Chloride: 104 mmol/L (ref 98–111)
Creatinine, Ser: 0.6 mg/dL (ref 0.44–1.00)
GFR, Estimated: >60 mL/min (ref >60)
Glucose, Bld: 107 mg/dL — ABNORMAL HIGH (ref 70–99)
Potassium: 3.7 mmol/L (ref 3.5–5.1)
Sodium: 139 mmol/L (ref 135–145)
Total Bilirubin: 0.3 mg/dL (ref 0.3–1.2)
Total Protein: 7 g/dL (ref 6.5–8.1)

## 2021-09-03 LAB — IRON AND TIBC
Iron: 79 ug/dL (ref 28–170)
Saturation Ratios: 18 % (ref 10.4–31.8)
TIBC: 446 ug/dL (ref 250–450)
UIBC: 367 ug/dL

## 2021-09-03 LAB — MAGNESIUM: Magnesium: 1.9 mg/dL (ref 1.7–2.4)

## 2021-09-03 LAB — FERRITIN: Ferritin: 26 ng/mL (ref 11–307)

## 2021-09-03 LAB — FOLATE: Folate: 19.6 ng/mL (ref >5.9)

## 2021-09-03 LAB — VITAMIN B12: Vitamin B-12: 1023 pg/mL — ABNORMAL HIGH (ref 180–914)

## 2021-09-03 MED ORDER — OXYCODONE HCL 20 MG PO TABS
20.0000 mg | ORAL_TABLET | ORAL | 0 refills | Status: DC | PRN
Start: 2021-09-03 — End: 2021-10-03

## 2021-09-03 MED ORDER — PROMETHAZINE HCL 25 MG PO TABS: 25.0000 mg | ORAL_TABLET | Freq: Four times a day (QID) | ORAL | 4 refills | Status: DC | PRN

## 2021-09-03 MED ORDER — HEPARIN SOD (PORK) LOCK FLUSH 100 UNIT/ML IV SOLN
500.0000 [IU] | Freq: Once | INTRAVENOUS | Status: AC
  Administered 2021-09-03: 500 [IU] via INTRAVENOUS

## 2021-09-03 MED ORDER — SODIUM CHLORIDE 0.9% FLUSH
10.0000 mL | INTRAVENOUS | Status: DC | PRN
  Administered 2021-09-03: 10 mL via INTRAVENOUS

## 2021-09-03 NOTE — Progress Notes (Signed)
Patients port flushed without difficulty.  Good blood return noted with no bruising or swelling noted at site.  Band aid applied.  VSS with discharge and left in satisfactory condition with no s/s of distress noted.   

## 2021-09-03 NOTE — Progress Notes (Signed)
phe 

## 2021-09-04 ENCOUNTER — Other Ambulatory Visit (HOSPITAL_COMMUNITY): Payer: Self-pay

## 2021-09-04 LAB — CA 125: Cancer Antigen (CA) 125: 9.9 U/mL (ref 0.0–38.1)

## 2021-09-05 ENCOUNTER — Other Ambulatory Visit: Payer: Medicare Other

## 2021-09-06 LAB — COPPER, SERUM: Copper: 144 ug/dL (ref 80–158)

## 2021-09-07 ENCOUNTER — Other Ambulatory Visit (HOSPITAL_COMMUNITY): Payer: Self-pay

## 2021-09-10 LAB — METHYLMALONIC ACID, SERUM: Methylmalonic Acid, Quantitative: 178 nmol/L (ref 0–378)

## 2021-09-11 ENCOUNTER — Ambulatory Visit: Payer: Medicare Other | Admitting: Urology

## 2021-09-11 ENCOUNTER — Ambulatory Visit: Payer: Medicare Other | Admitting: Physician Assistant

## 2021-09-11 NOTE — Progress Notes (Deleted)
Assessment: 1. Mixed stress and urge urinary incontinence   2. Microscopic hematuria     Plan: Diagnosis and management of stress and urge incontinence discussed with the patient.  Options for management of stress incontinence including pelvic floor exercises, incontinence pessaries, and surgical management discussed.  Options for management of urge incontinence including avoidance of dietary irritants, behavioral modification, medical therapy, neuromodulation, and chemodenervation discussed.  Information provided to the patient. Bladder diet sheet given. Instructions on Kegel exercises provided. Discussed timed and double voiding during the daytime hours. Today I had a discussion with the patient regarding the findings of microscopic hematuria including the implications and differential diagnoses associated with it.  I also discussed recommendations for further evaluation including the rationale for upper tract imaging and cystoscopy.  I discussed the nature of these procedures including potential risk and complications.  The patient expressed an understanding of these issues. I reviewed the patient's recent CT scan from 07/12/2021. Will repeat U/A next visit. Return to office in 3-4 weeks   Chief Complaint:  No chief complaint on file.   History of Present Illness:  Michelle Holt is a 51 y.o. year old female who is seen for further evaluation of urinary incontinence and microscopic hematuria.   She is status post a abdominal hysterectomy for treatment of ovarian cancer in 2020.  She has recently completed a course of chemotherapy as well.  She has had symptoms of stress incontinence for approximately 1 year.  She reports incontinence associated with coughing.  This does not occur on a daily basis.  She has also noted increased nocturia 5-6 times per night and urgency and urge incontinence at night.  She voids 1 time per day despite drinking fluids throughout the day.  She is using  pantiliners during the day.  No dysuria or gross hematuria.  No recent UTIs.  No prior treatment for incontinence.  She denies any fecal incontinence. Pelvic exam demonstrated no significant prolapse and no obvious stress incontinence. PVR = 0 ml  CT chest abdomen and pelvis from 07/12/2021 showed no renal masses, no renal calculi, no obstruction, and a normal-appearing bladder.  There was no evidence of recurrent or metastatic disease. Urinalysis from 7/23 showed 3-10 RBCs.  Portions of the above documentation were copied from a prior visit for review purposes only.   Past Medical History:  Past Medical History:  Diagnosis Date   Anemia    Anxiety and depression    Arthritis of facet joints at multiple vertebral levels    L5-S1   Constipation    Dyslipidemia    Family history of breast cancer    Family history of uterine cancer    GERD (gastroesophageal reflux disease)    History of hiatal hernia    History of kidney stones    Insomnia    Irritable bowel syndrome    Migraine    Muscle tension headache    Neuropathy of finger    Ovarian carcinoma (HCC)    ovarian   Plantar fasciitis of right foot    Port-A-Cath in place 08/20/2018    Past Surgical History:  Past Surgical History:  Procedure Laterality Date   ABDOMINAL HYSTERECTOMY     BIOPSY  10/27/2020   Procedure: BIOPSY;  Surgeon: Harvel Quale, MD;  [facility];  Service: Gastroenterology;;   CHOLECYSTECTOMY  2008   COLONOSCOPY N/A 08/13/2013   Procedure: COLONOSCOPY;  Surgeon: Rogene Houston, MD;  [facility];  Service: Endoscopy;  Laterality: N/A;  230-moved to 145 Ann to notify pt   COLONOSCOPY WITH PROPOFOL N/A 11/15/2020   Procedure: COLONOSCOPY WITH PROPOFOL;  Surgeon: Rogene Houston, MD;  [facility];  Service: Endoscopy;  Laterality: N/A;  1:40   ESOPHAGEAL DILATION N/A 02/28/2021   Procedure: ESOPHAGEAL DILATION;  Surgeon: Rogene Houston, MD;  [facility];  Service: Endoscopy;  Laterality: N/A;   ESOPHAGOGASTRODUODENOSCOPY     ESOPHAGOGASTRODUODENOSCOPY (EGD) WITH PROPOFOL N/A 07/20/2018   Procedure: ESOPHAGOGASTRODUODENOSCOPY (EGD) WITH PROPOFOL;  Surgeon: Rogene Houston, MD;  [facility];  Service: Endoscopy;  Laterality: N/A;  Possible esophageal dilation.   ESOPHAGOGASTRODUODENOSCOPY (EGD) WITH PROPOFOL N/A 10/27/2020   Procedure: ESOPHAGOGASTRODUODENOSCOPY (EGD) WITH PROPOFOL;  Surgeon: Harvel Quale, MD;  [facility];  Service: Gastroenterology;  Laterality: N/A;  2:10, pt knows to arrive at 10:15   ESOPHAGOGASTRODUODENOSCOPY (EGD) WITH PROPOFOL N/A 02/28/2021   Procedure: ESOPHAGOGASTRODUODENOSCOPY (EGD) WITH PROPOFOL;  Surgeon: Rogene Houston, MD;  [facility];  Service: Endoscopy;  Laterality: N/A;  200 ASA 1   GIVENS CAPSULE STUDY N/A 11/24/2020   Procedure: GIVENS CAPSULE STUDY;  Surgeon: Rogene Houston, MD;  [facility];  Service: Endoscopy;  Laterality: N/A;  7:30   IR ANGIOGRAM SELECTIVE EACH ADDITIONAL VESSEL  08/01/2018   IR ANGIOGRAM VISCERAL SELECTIVE  08/01/2018   IR EMBO ART  VEN HEMORR LYMPH EXTRAV  INC GUIDE ROADMAPPING  08/01/2018   IR IMAGING GUIDED PORT INSERTION  08/20/2018   IR PERC PLEURAL DRAIN W/INDWELL CATH W/IMG GUIDE  07/08/2018   IR THORACENTESIS ASP PLEURAL SPACE W/IMG GUIDE  07/07/2018   IR US GUIDE VASC ACCESS RIGHT  08/01/2018   PLEURAL EFFUSION DRAINAGE Left 07/13/2018   Procedure: DRAINAGE OF LOCULATED PLEURAL EFFUSION;  Surgeon: Ivin Poot, MD;  Location: [place];  Service: Thoracic;  Laterality: Left;   POLYPECTOMY  11/15/2020   Procedure: POLYPECTOMY;  Surgeon: Rogene Houston, MD;  [facility];  Service: Endoscopy;;   REMOVAL OF PLEURAL DRAINAGE CATHETER Left 08/20/2018   Procedure: REMOVAL OF PLEURAL DRAINAGE CATHETER;  Surgeon: Ivin Poot, MD;  Location: [place];  Service: Thoracic;  Laterality: Left;   REMOVAL OF  PLEURAL DRAINAGE CATHETER Left 08/20/2018   Procedure: REMOVAL OF PLEURAL DRAINAGE CATHETER;  Surgeon: Ivin Poot, MD;  Location: [place];  Service: Thoracic;  Laterality: Left;   TALC PLEURODESIS Left 07/13/2018   Procedure: Talc Pleuradesis;  Surgeon: Prescott Gum, Collier Salina, MD;  Location: [place];  Service: Thoracic;  Laterality: Left;   TOTAL HIP ARTHROPLASTY Left 06/05/2020   Procedure: LEFT TOTAL HIP ARTHROPLASTY ANTERIOR APPROACH;  Surgeon: Leandrew Koyanagi, MD;  Location: [place];  Service: Orthopedics;  Laterality: Left;  3-C   TUBAL LIGATION Bilateral    UTERINE ABLATION     VIDEO ASSISTED THORACOSCOPY Left 07/13/2018   Procedure: VIDEO ASSISTED THORACOSCOPY;  Surgeon: Ivin Poot, MD;  Location: [place];  Service: Thoracic;  Laterality: Left;    Allergies:  Allergies  Allergen Reactions   Morphine And Related Itching   Nickel Itching   Nortriptyline Other (See Comments)    Significant weight gain   Topamax [Topiramate] Diarrhea and Nausea Only   Xanax [Alprazolam]     Can't wake up    Actifed Cold-Allergy [Chlorpheniramine-Phenyleph Er] Rash    Red dye only   Amoxicillin Rash    Did it involve swelling of the face/tongue/throat, SOB, or low  BP? Unknown Did it involve sudden or severe rash/hives, skin peeling, or any reaction on the inside of your mouth or nose? Unknown Did you need to seek medical attention at a hospital or doctor's office? Unknown When did it last happen? teenager       If all above answers are "NO", may proceed with cephalosporin use.    Codeine Hives   Erythromycin Rash   Penicillins Rash    Did it involve swelling of the face/tongue/throat, SOB, or low BP? Unknown Did it involve sudden or severe rash/hives, skin peeling, or any reaction on the inside of your mouth or nose? Unknown Did you need to seek medical attention at a hospital or doctor's office? Unknown When did it last happen? teenager       If all above answers are "NO", may proceed with  cephalosporin use.    Sudafed [Pseudoephedrine Hcl] Rash    Red dye only    Family History:  Family History  Problem Relation Age of Onset   Hypertension Mother    Obesity Mother    Diabetes Mother    Kidney disease Mother    Peripheral vascular disease Father    Atrial fibrillation Father    COPD Brother    Osteoporosis Brother    Crohn's disease Sister    Uterine cancer Sister 27       maternal half sister   Breast cancer Maternal Aunt 101   Colon cancer Neg Hx     Social History:  Social History   Tobacco Use   Smoking status: Former    Packs/day: 0.50    Years: 17.00    Total pack years: 8.50    Types: Cigarettes    Quit date: 06/22/2018    Years since quitting: 3.2   Smokeless tobacco: Never  Vaping Use   Vaping Use: Never used  Substance Use Topics   Alcohol use: Not Currently    Alcohol/week: 1.0 standard drink of alcohol    Types: 1 Glasses of wine per week   Drug use: No    ROS: Constitutional:  Negative for fever, chills, weight loss CV: Negative for chest pain, previous MI, hypertension Respiratory:  Negative for shortness of breath, wheezing, sleep apnea, frequent cough GI:  Negative for nausea, vomiting, bloody stool, GERD  Physical exam: LMP  (LMP Unknown)  GENERAL APPEARANCE:  Well appearing, well developed, well nourished, NAD HEENT:  Atraumatic, normocephalic, oropharynx clear NECK:  Supple without lymphadenopathy or thyromegaly ABDOMEN:  Soft, non-tender, no masses EXTREMITIES:  Moves all extremities well, without clubbing, cyanosis, or edema NEUROLOGIC:  Alert and oriented x 3, normal gait, CN II-XII grossly intact MENTAL STATUS:  appropriate BACK:  Non-tender to palpation, No CVAT SKIN:  Warm, dry, and intact  Results: U/A:

## 2021-09-12 ENCOUNTER — Inpatient Hospital Stay (HOSPITAL_BASED_OUTPATIENT_CLINIC_OR_DEPARTMENT_OTHER): Payer: Medicare Other | Admitting: Hematology

## 2021-09-12 VITALS — BP 113/75 | HR 90 | Temp 98.2°F | Resp 16 | Ht 64.0 in | Wt 190.6 lb

## 2021-09-12 DIAGNOSIS — C563 Malignant neoplasm of bilateral ovaries: Secondary | ICD-10-CM

## 2021-09-12 MED ORDER — VENLAFAXINE HCL 37.5 MG PO TABS: ORAL_TABLET | ORAL | 0 refills | Status: DC

## 2021-09-12 NOTE — Patient Instructions (Signed)
[place] at [facility] Discharge Instructions   You were seen and examined today by Dr. Delton Coombes.  He reviewed your lab work which is normal/stable. Your iron is a little low again. Since you are complaining of fatigue, we will arrange for you to get 2 doses of IV iron.  Continue Lynparza as prescribed.   We will see you back in 4 weeks.   Thank you for choosing [facility] at [facility] to provide your oncology and hematology care.  To afford each patient quality time with our provider, please arrive at least 15 minutes before your scheduled appointment time.   If you have a lab appointment with the [facility] please come in thru the Main Entrance and check in at the main information desk.  You need to re-schedule your appointment should you arrive 10 or more minutes late.  We strive to give you quality time with our providers, and arriving late affects you and other patients whose appointments are after yours.  Also, if you no show three or more times for appointments you may be dismissed from the clinic at the providers discretion.     Again, thank you for choosing [facility].  Our hope is that these requests will decrease the amount of time that you wait before being seen by our physicians.       _____________________________________________________________  Should you have questions after your visit to [facility], please contact our office at [phone] and follow the prompts.  Our office hours are 8:00 a.m. and 4:30 p.m. Monday - Friday.  Please note that voicemails left after 4:00 p.m. may not be returned until the following business day.  We are closed weekends and major holidays.  You do have access to a nurse 24-7, just call the main number to the clinic [phone] and do not press any options, hold on the line and a nurse will answer the phone.    For prescription refill requests, have your  pharmacy contact our office and allow 72 hours.    Due to Covid, you will need to wear a mask upon entering the hospital. If you do not have a mask, a mask will be given to you at the Main Entrance upon arrival. For doctor visits, patients may have 1 support person age 53 or older with them. For treatment visits, patients can not have anyone with them due to social distancing guidelines and our immunocompromised population.

## 2021-09-12 NOTE — Progress Notes (Signed)
[place], [place] [address]   CLINIC:  Medical Oncology/Hematology  PCP:  [name], MD [address] / [place] [address] [phone]   REASON FOR VISIT:  Follow-up for high-grade serous ovarian cancer  PRIOR THERAPY:  1. Carboplatin and paclitaxel x 7 cycles from 08/24/2018 to 03/15/2019. 2. Laparoscopic TAH & BSO & omenectomy on 12/24/2018. 3.  6 cycles of carboplatin and paclitaxel from 03/05/2021 to 06/18/2021  NGS Results: not done  CURRENT THERAPY: olaparib  BRIEF ONCOLOGIC HISTORY:  Oncology History  Ovarian cancer, bilateral ([place])  07/07/2018 Pathology Results   PLEURAL FLUID, LEFT (SPECIMEN 1 OF 1, COLLECTED 07/07/18): - MALIGNANT CELLS CONSISTENT WITH ADENOCARCINOMA - SEE COMMENT  Source Pleural Fluid, (Specimen 1 of 1, collected on 07/07/2018) Gross Specimen: Received is/are 1000cc of bloody red fluid with tissue. (TC:tc) Prepared: # Smears: 0 # Concentration Technique Slides (i.e. ThinPrep): 1 # Cell Block: 1 Conventional Additional Studies: Two Hematology slides labeled [id number] Comment The malignant cells are positive for cytokeratin 7, p53, WT-1, Pax-8, Moc31, ER (weak) and EMA but negative for cytokeratin 20, TTF-1, GATA-3, CDX-2 and D2-40. Overall, the phenotype is consistent with a gynecologic primary; clinical correlation recommended.   07/07/2018 Procedure   Successful ultrasound guided left thoracentesis yielding 2.0 L of pleural fluid   07/08/2018 Procedure   1. Technically successful placement of left 14 French pigtail chest drain, placed to Pleur-evac water-seal.   07/08/2018 Procedure   1. Technically successful five [place] double lumen power injectable PICC placement   07/09/2018 Imaging   Ct chest 1. There is a moderate, loculated left hydropneumothorax with a small air component and moderate fluid component. The largest loculated component is located posteriorly. There is a pigtail drainage catheter  about the lateral pleural space. There is no obvious etiology, such as obvious mass or pleural disease.   2. There is a small right pleural effusion with associated atelectasis or consolidation and a subpleural consolidation of the superior segment right lower lobe (series 4, image 56), of uncertain significance, possibly infectious or inflammatory   07/10/2018 Imaging   Ct abdomen and pelvis: 1. The bilateral ovaries are enlarged by heterogeneous appearing cystic lesions, measuring 5.3 x 4.2 cm on the right (series 4, image 72) and 4.5 x 3.2 cm on the left (series 4, image 75). Consider dedicated pelvic ultrasound and/or pelvic MRI to further evaluate for solid components given high suspicion for GYN primary malignancy.   2. No other evidence of mass and no lymphadenopathy in the abdomen or pelvis.   3. Trace ascites. There is some suggestion of omental and peritoneal nodularity (e.g. Series 4, image 28), concerning for peritoneal metastatic disease.    4. Loculated left-sided pleural effusion with left-sided pleural drainage catheter in position. Small right pleural effusion   07/13/2018 Surgery   OPERATION: 1.  Left VATS (video-assisted thoracoscopic surgery) for drainage of loculated pleural effusion. 2.  Talc pleurodesis for malignant pleural effusion. 3.  Placement of PleurX catheter for management of malignant pleural effusion. 4.  Placement of On-Q analgesia catheter system.    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   07/13/2018 Pathology Results   Pleura, peel, Left Pleural - FIBRO-FIBRINOUS PLEURITIS - NEGATIVE FOR MALIGNANCY   07/18/2018 Initial Diagnosis   Ovarian cancer, bilateral ([facility])   07/20/2018 Procedure  EGD impression: Normal proximal esophagus and mid esophagus. Mild distal  esophageal rings; dilation not performed because of esophagitis. LA Grade C reflux esophagitis. Z-line regular, 30 cm from the incisors. 5 cm hiatal hernia. Non-bleeding gastric ulcer with no stigmata of bleeding. Gastritis. Duodenal erosions without bleeding. Normal second portion of the duodenum. No specimens collected.   08/19/2018 Genetic Testing   Negative genetic testing on the common hereditary cancer panel.  The Common Hereditary Gene Panel offered by [facility] includes sequencing and/or deletion duplication testing of the following 48 genes: APC, ATM, AXIN2, BARD1, BMPR1A, BRCA1, BRCA2, BRIP1, CDH1, CDK4, CDKN2A (p14ARF), CDKN2A (p16INK4a), CHEK2, CTNNA1, DICER1, EPCAM (Deletion/duplication testing only), GREM1 (promoter region deletion/duplication testing only), KIT, MEN1, MLH1, MSH2, MSH3, MSH6, MUTYH, NBN, NF1, NHTL1, PALB2, PDGFRA, PMS2, POLD1, POLE, PTEN, RAD50, RAD51C, RAD51D, RNF43, SDHB, SDHC, SDHD, SMAD4, SMARCA4. STK11, TP53, TSC1, TSC2, and VHL.  The following genes were evaluated for sequence changes only: SDHA and HOXB13 c.251G>A variant only. The report date is August 19, 2018.   08/24/2018 - 03/15/2019 Chemotherapy   Patient is on Treatment Plan : OVARIAN Carboplatin (AUC 6) / Paclitaxel (175) q21d x 6 cycles     03/05/2021 -  Chemotherapy   Patient is on Treatment Plan : OVARIAN Carboplatin (AUC 6) / Paclitaxel (175) q21d x 6 cycles       CANCER STAGING:  Cancer Staging  No matching staging information was found for the patient.  INTERVAL HISTORY:  Ms. AZANI BROGDON, a 51 y.o. female, seen for follow-up of high-grade serous ovarian carcinoma.  She started taking olaparib 300 mg twice daily on 08/22/2021.  She reported some nausea but denied any vomiting.  She is taking Zofran 30 minutes prior to taking olaparib.  She is using Phenergan after that for intermittent nausea.  She complains of severe fatigue.  REVIEW OF SYSTEMS:  Review of Systems  Constitutional:  Negative  for fatigue.  Respiratory:  Positive for shortness of breath.   Cardiovascular:  Negative for chest pain.  Gastrointestinal:  Positive for nausea. Negative for diarrhea and vomiting.  Genitourinary:  Negative for hematuria.   Musculoskeletal:  Negative for arthralgias.  Neurological:  Positive for headaches and numbness (R hand).  Psychiatric/Behavioral:  Positive for depression. The patient is nervous/anxious.   All other systems reviewed and are negative.   PAST MEDICAL/SURGICAL HISTORY:  Past Medical History:  Diagnosis Date   Anemia    Anxiety and depression    Arthritis of facet joints at multiple vertebral levels    L5-S1   Constipation    Dyslipidemia    Family history of breast cancer    Family history of uterine cancer    GERD (gastroesophageal reflux disease)    History of hiatal hernia    History of kidney stones    Insomnia    Irritable bowel syndrome    Migraine    Muscle tension headache    Neuropathy of finger    Ovarian carcinoma ([facility])    ovarian   Plantar fasciitis of right foot    Port-A-Cath in place 08/20/2018   Past Surgical History:  Procedure Laterality Date   ABDOMINAL HYSTERECTOMY     BIOPSY  10/27/2020   Procedure: BIOPSY;  Surgeon: Harvel Quale, MD;  [facility];  Service: Gastroenterology;;   CHOLECYSTECTOMY  2008   COLONOSCOPY N/A 08/13/2013   Procedure: COLONOSCOPY;  Surgeon: Rogene Houston, MD;  [facility];  Service: Endoscopy;  Laterality: N/A;  230-moved to  145 Ann to notify pt   COLONOSCOPY WITH PROPOFOL N/A 11/15/2020   Procedure: COLONOSCOPY WITH PROPOFOL;  Surgeon: Rogene Houston, MD;  [facility];  Service: Endoscopy;  Laterality: N/A;  1:40   ESOPHAGEAL DILATION N/A 02/28/2021   Procedure: ESOPHAGEAL DILATION;  Surgeon: Rogene Houston, MD;  [facility];  Service: Endoscopy;  Laterality: N/A;   ESOPHAGOGASTRODUODENOSCOPY     ESOPHAGOGASTRODUODENOSCOPY (EGD) WITH  PROPOFOL N/A 07/20/2018   Procedure: ESOPHAGOGASTRODUODENOSCOPY (EGD) WITH PROPOFOL;  Surgeon: Rogene Houston, MD;  [facility];  Service: Endoscopy;  Laterality: N/A;  Possible esophageal dilation.   ESOPHAGOGASTRODUODENOSCOPY (EGD) WITH PROPOFOL N/A 10/27/2020   Procedure: ESOPHAGOGASTRODUODENOSCOPY (EGD) WITH PROPOFOL;  Surgeon: Harvel Quale, MD;  [facility];  Service: Gastroenterology;  Laterality: N/A;  2:10, pt knows to arrive at 10:15   ESOPHAGOGASTRODUODENOSCOPY (EGD) WITH PROPOFOL N/A 02/28/2021   Procedure: ESOPHAGOGASTRODUODENOSCOPY (EGD) WITH PROPOFOL;  Surgeon: Rogene Houston, MD;  [facility];  Service: Endoscopy;  Laterality: N/A;  200 ASA 1   GIVENS CAPSULE STUDY N/A 11/24/2020   Procedure: GIVENS CAPSULE STUDY;  Surgeon: Rogene Houston, MD;  [facility];  Service: Endoscopy;  Laterality: N/A;  7:30   IR ANGIOGRAM SELECTIVE EACH ADDITIONAL VESSEL  08/01/2018   IR ANGIOGRAM VISCERAL SELECTIVE  08/01/2018   IR EMBO ART  VEN HEMORR LYMPH EXTRAV  INC GUIDE ROADMAPPING  08/01/2018   IR IMAGING GUIDED PORT INSERTION  08/20/2018   IR PERC PLEURAL DRAIN W/INDWELL CATH W/IMG GUIDE  07/08/2018   IR THORACENTESIS ASP PLEURAL SPACE W/IMG GUIDE  07/07/2018   IR US GUIDE VASC ACCESS RIGHT  08/01/2018   PLEURAL EFFUSION DRAINAGE Left 07/13/2018   Procedure: DRAINAGE OF LOCULATED PLEURAL EFFUSION;  Surgeon: Ivin Poot, MD;  Location: [facility];  Service: Thoracic;  Laterality: Left;   POLYPECTOMY  11/15/2020   Procedure: POLYPECTOMY;  Surgeon: Rogene Houston, MD;  [facility];  Service: Endoscopy;;   REMOVAL OF PLEURAL DRAINAGE CATHETER Left 08/20/2018   Procedure: REMOVAL OF PLEURAL DRAINAGE CATHETER;  Surgeon: Ivin Poot, MD;  Location: [facility];  Service: Thoracic;  Laterality: Left;   REMOVAL OF PLEURAL DRAINAGE CATHETER Left 08/20/2018   Procedure: REMOVAL OF PLEURAL DRAINAGE CATHETER;  Surgeon: Ivin Poot, MD;   Location: [facility];  Service: Thoracic;  Laterality: Left;   TALC PLEURODESIS Left 07/13/2018   Procedure: Talc Pleuradesis;  Surgeon: Prescott Gum, Collier Salina, MD;  Location: [facility];  Service: Thoracic;  Laterality: Left;   TOTAL HIP ARTHROPLASTY Left 06/05/2020   Procedure: LEFT TOTAL HIP ARTHROPLASTY ANTERIOR APPROACH;  Surgeon: Leandrew Koyanagi, MD;  Location: [facility];  Service: Orthopedics;  Laterality: Left;  3-C   TUBAL LIGATION Bilateral    UTERINE ABLATION     VIDEO ASSISTED THORACOSCOPY Left 07/13/2018   Procedure: VIDEO ASSISTED THORACOSCOPY;  Surgeon: Ivin Poot, MD;  Location: [facility];  Service: Thoracic;  Laterality: Left;    SOCIAL HISTORY:  Social History   Socioeconomic History   Marital status: Widowed    Spouse name: Not on file   Number of children: 2   Years of education: 2-College   Highest education level: Not on file  Occupational History    Employer: [employer]  Tobacco Use   Smoking status: Former    Packs/day: 0.50    Years: 17.00    Total pack years: 8.50    Types: Cigarettes  Quit date: 06/22/2018    Years since quitting: 3.2   Smokeless tobacco: Never  Vaping Use   Vaping Use: Never used  Substance and Sexual Activity   Alcohol use: Not Currently    Alcohol/week: 1.0 standard drink of alcohol    Types: 1 Glasses of wine per week   Drug use: No   Sexual activity: Not on file  Other Topics Concern   Not on file  Social History Narrative   Patient lives at home with her daughter.    Patient has 2 children.    Patient is widowed.    Patient is right handed.    Patient has her Associates degree.      Social Determinants of Health   Financial Resource Strain: Low Risk  (07/22/2018)   Overall Financial Resource Strain (CARDIA)    Difficulty of Paying Living Expenses: Not hard at all  Food Insecurity: No Food Insecurity (07/22/2018)   Hunger Vital Sign    Worried About Running Out of Food in the Last Year: Never true    Ran Out of Food in the Last Year: Never  true  Transportation Needs: No Transportation Needs (07/22/2018)   PRAPARE - Hydrologist (Medical): No    Lack of Transportation (Non-Medical): No  Physical Activity: Inactive (07/22/2018)   Exercise Vital Sign    Days of Exercise per Week: 0 days    Minutes of Exercise per Session: 0 min  Stress: Stress Concern Present (07/22/2018)   [place]    Feeling of Stress : Very much  Social Connections: Moderately Isolated (07/22/2018)   Social Connection and Isolation Panel [NHANES]    Frequency of Communication with Friends and Family: More than three times a week    Frequency of Social Gatherings with Friends and Family: More than three times a week    Attends Religious Services: 1 to 4 times per year    Active Member of [employer] or Organizations: No    Attends Archivist Meetings: Never    Marital Status: Widowed  Intimate Partner Violence: Not At Risk (07/22/2018)   Humiliation, Afraid, Rape, and Kick questionnaire    Fear of Current or Ex-Partner: No    Emotionally Abused: No    Physically Abused: No    Sexually Abused: No    FAMILY HISTORY:  Family History  Problem Relation Age of Onset   Hypertension Mother    Obesity Mother    Diabetes Mother    Kidney disease Mother    Peripheral vascular disease Father    Atrial fibrillation Father    COPD Brother    Osteoporosis Brother    Crohn's disease Sister    Uterine cancer Sister 63       maternal half sister   Breast cancer Maternal Aunt 33   Colon cancer Neg Hx     CURRENT MEDICATIONS:  Current Outpatient Medications  Medication Sig Dispense Refill   albuterol (VENTOLIN HFA) 108 (90 Base) MCG/ACT inhaler Inhale 2 puffs into the lungs every 6 (six) hours as needed for wheezing or shortness of breath.     B Complex-C (B-COMPLEX WITH VITAMIN C) tablet Take 1 tablet by mouth daily.     Cholecalciferol (VITAMIN D) 50 MCG (2000 UT)  CAPS Take 2,000 Units by mouth daily.     dexamethasone (DECADRON) 4 MG tablet Take 1 tablet (4 mg total) by mouth daily. Three days after each treatment 30  tablet 2   diazepam (VALIUM) 5 MG tablet TAKE ONE TABLET BY MOUTH AT BEDTIME (Patient taking differently: Take 5 mg by mouth at bedtime.) 30 tablet 5   dicyclomine (BENTYL) 10 MG capsule Take 1 capsule (10 mg total) by mouth 3 (three) times daily as needed for spasms. 90 capsule 3   folic acid (FOLVITE) 1 MG tablet Take 1 tablet (1 mg total) by mouth daily. 30 tablet 1   gabapentin (NEURONTIN) 100 MG capsule TAKE TWO (2) CAPSULES BY MOUTH AT BEDTIME (Patient taking differently: Take 100 mg by mouth daily as needed (for nerve pain).) 60 capsule 2   guaiFENesin (MUCINEX) 600 MG 12 hr tablet Take 1 tablet (600 mg total) by mouth 2 (two) times daily. 10 tablet 0   lactulose (CHRONULAC) 10 GM/15ML solution TAKE ONE TABLESPOONFUL (15ML) EVERY 3 HOURS UNTIL BOWEL MOVEMENT ACHIEVED THEN TAKEN DAILY AS NEEDED 480 mL 6   linaclotide (LINZESS) 290 MCG CAPS capsule Take 1 capsule (290 mcg total) by mouth daily before breakfast. (Patient taking differently: Take 290 mcg by mouth daily as needed (if no BM after a week).) 30 capsule 6   LORazepam (ATIVAN) 0.5 MG tablet Take 0.5 mg by mouth 3 (three) times daily as needed.     magic mouthwash w/lidocaine SOLN Take 5 mLs by mouth 3 (three) times daily as needed for mouth pain (Swish and spit). 480 mL 6   magnesium oxide (MAG-OX) 400 (240 Mg) MG tablet Take 1 tablet (400 mg total) by mouth 2 (two) times daily. 60 tablet 6   mirabegron ER (MYRBETRIQ) 25 MG TB24 tablet Take 1 tablet (25 mg total) by mouth daily. 28 tablet 0   olaparib (LYNPARZA) 150 MG tablet Take 2 tablets (300 mg total) by mouth 2 (two) times daily. Swallow whole. May take with food to decrease nausea and vomiting. 120 tablet 2   omeprazole (PRILOSEC) 40 MG capsule Take 1 capsule (40 mg total) by mouth 2 (two) times daily before a meal. 60 capsule 5    Oxycodone HCl 20 MG TABS Take 1 tablet (20 mg total) by mouth every 4 (four) hours as needed. 180 tablet 0   predniSONE (DELTASONE) 10 MG tablet Prednisone 40 mg po daily x 1 day then Prednisone 30 mg po daily x 1 day then Prednisone 20 mg po daily x 1 day then Prednisone 10 mg daily x 1 day then stop... 10 tablet 0   sennosides-docusate sodium (SENOKOT-S) 8.6-50 MG tablet Take 1 tablet by mouth 2 (two) times daily.     SUMAtriptan (IMITREX) 100 MG tablet TAKE ONE TABLET BY MOUTH PRN UP to TWICE DAILY AS NEEDED. (Patient taking differently: Take 100 mg by mouth 2 (two) times daily as needed for migraine.) 9 tablet 5   tiZANidine (ZANAFLEX) 4 MG tablet Take 1 tablet (4 mg total) by mouth 3 (three) times daily as needed for muscle spasms. (Patient taking differently: Take 4 mg by mouth daily as needed for muscle spasms.) 60 tablet 0   triamcinolone cream (KENALOG) 0.1 % Apply 1 application topically 2 (two) times daily as needed (rash). 80 g 11   venlafaxine (EFFEXOR) 37.5 MG tablet Take 2 tablets by mouth daily for 14 days; then take 1 tablet by mouth daily for 14 days; then take 1 tablet every other day until completed 50 tablet 0   lidocaine-prilocaine (EMLA) cream Apply 1 application. topically as needed (pain). (Patient not taking: Reported on 09/12/2021)     ondansetron (ZOFRAN) 4 MG  tablet Take 1 tablet (4 mg total) by mouth every 8 (eight) hours as needed for nausea or vomiting. (Patient not taking: Reported on 09/12/2021) 40 tablet 3   promethazine (PHENERGAN) 25 MG tablet Take 1 tablet (25 mg total) by mouth every 6 (six) hours as needed for nausea or vomiting. (Patient not taking: Reported on 09/12/2021) 30 tablet 4   No current facility-administered medications for this visit.   Facility-Administered Medications Ordered in Other Visits  Medication Dose Route Frequency Provider Last Rate Last Admin   0.9 %  sodium chloride infusion   Intravenous Continuous Doreatha Massed, MD   Stopped  at 10/23/20 1547   sodium chloride flush (NS) 0.9 % injection 10 mL  10 mL Intravenous PRN Doreatha Massed, MD   10 mL at 10/23/20 1544    ALLERGIES:  Allergies  Allergen Reactions   Morphine And Related Itching   Nickel Itching   Nortriptyline Other (See Comments)    Significant weight gain   Topamax [Topiramate] Diarrhea and Nausea Only   Xanax [Alprazolam]     Can't wake up    Actifed Cold-Allergy [Chlorpheniramine-Phenyleph Er] Rash    Red dye only   Amoxicillin Rash    Did it involve swelling of the face/tongue/throat, SOB, or low BP? Unknown Did it involve sudden or severe rash/hives, skin peeling, or any reaction on the inside of your mouth or nose? Unknown Did you need to seek medical attention at a hospital or doctor's office? Unknown When did it last happen? teenager       If all above answers are "NO", may proceed with cephalosporin use.    Codeine Hives   Erythromycin Rash   Penicillins Rash    Did it involve swelling of the face/tongue/throat, SOB, or low BP? Unknown Did it involve sudden or severe rash/hives, skin peeling, or any reaction on the inside of your mouth or nose? Unknown Did you need to seek medical attention at a hospital or doctor's office? Unknown When did it last happen? teenager       If all above answers are "NO", may proceed with cephalosporin use.    Sudafed [Pseudoephedrine Hcl] Rash    Red dye only    PHYSICAL EXAM:  Performance status (ECOG): 1 - Symptomatic but completely ambulatory  Vitals:   09/12/21 1145  BP: 113/75  Pulse: 90  Resp: 16  Temp: 98.2 F (36.8 C)  SpO2: 97%   Wt Readings from Last 3 Encounters:  09/12/21 190 lb 9.6 oz (86.5 kg)  08/15/21 184 lb 8 oz (83.7 kg)  08/02/21 190 lb (86.2 kg)   Physical Exam Vitals reviewed.  Constitutional:      Appearance: Normal appearance. She is obese.  Cardiovascular:     Rate and Rhythm: Normal rate and regular rhythm.     Pulses: Normal pulses.     Heart sounds:  Normal heart sounds.  Pulmonary:     Effort: Pulmonary effort is normal.     Breath sounds: Normal breath sounds.  Neurological:     General: No focal deficit present.     Mental Status: She is alert and oriented to person, place, and time.  Psychiatric:        Mood and Affect: Mood normal.        Behavior: Behavior normal.      LABORATORY DATA:  I have reviewed the labs as listed.     Latest Ref Rng & Units 09/03/2021    2:33 PM 08/09/2021  3:11 PM 07/11/2021    1:54 PM  CBC  WBC 4.0 - 10.5 K/uL 5.6  6.3  4.0   Hemoglobin 12.0 - 15.0 g/dL 11.0  11.7  9.3   Hematocrit 36.0 - 46.0 % 33.1  35.3  28.1   Platelets 150 - 400 K/uL 272  310  142       Latest Ref Rng & Units 09/03/2021    2:33 PM 08/09/2021    3:11 PM 07/11/2021    1:54 PM  CMP  Glucose 70 - 99 mg/dL 107  99  149   BUN 6 - 20 mg/dL $Remove'10  8  8   'mfuuNsw$ Creatinine 0.44 - 1.00 mg/dL 0.60  0.57  0.68   Sodium 135 - 145 mmol/L 139  138  135   Potassium 3.5 - 5.1 mmol/L 3.7  3.5  3.7   Chloride 98 - 111 mmol/L 104  103  99   CO2 22 - 32 mmol/L $RemoveB'27  28  25   'mwdyKvEj$ Calcium 8.9 - 10.3 mg/dL 8.9  9.4  8.8   Total Protein 6.5 - 8.1 g/dL 7.0  6.8  6.6   Total Bilirubin 0.3 - 1.2 mg/dL 0.3  0.3  0.4   Alkaline Phos 38 - 126 U/L 80  89  96   AST 15 - 41 U/L $Remo'18  23  24   'Pjqjo$ ALT 0 - 44 U/L $Remo'15  19  24     'SXIYA$ DIAGNOSTIC IMAGING:  I have independently reviewed the scans and discussed with the patient. No results found.   ASSESSMENT:  1.  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 -Robotic assisted laparoscopic TAH and BSO and omentectomy on 12/24/2018, pathology showing high-grade serous carcinoma, PT3P NX. -Germline mutation testing was negative. -3 more cycles of adjuvant chemotherapy completed on 03/15/2019. -CTAP on 04/13/2019 showed no findings of active malignancy.  28% reduction in the volume of presumed chronic  hematoma/chronic fluid collection splaying the upper margin of the spleen.  Large type III hiatal hernia. -CTAP on 10/13/2019 shows no findings of recurrence or metastatic disease. - Foundation 1 shows HRD+, LOH score>16%.  MSI-stable.  MYC amplification.  T p53 mutation. - We reviewed CT CAP from 02/21/2021 which showed progressive peritoneal metastasis compared to 12/06/2020 scan.  No bowel obstruction.  Small right pleural effusion with mild enlargement of an isolated left external iliac lymph node. - Reviewed EGD from 02/28/2021 which showed hiatal hernia and normal findings. - She reported epigastric and left upper quadrant pain worse in the last 1 week which is related to progression of her malignancy. - Cycle 1 of carboplatin and paclitaxel started on 03/05/2021.    PLAN:  1.  Clinical stage IVa high-grade serous ovarian cancer, positive cytology of left pleural effusion, HRD positive: - CT CAP (07/12/2021): No evidence of recurrence or metastatic disease in the chest, abdomen or pelvis.  Previously seen peritoneal, omental and nodal metastatic disease is resolved. - Olaparib 300 mg twice daily started on 08/22/2021. - She is taking Zofran 30 minutes prior to taking olaparib.  She has intermittent nausea for which she takes Phenergan as needed.  Denied any vomiting. - Reviewed labs today which showed normal LFTs and creatinine.  CBC shows normal white count and platelet count.  CA125 was 9.9. - Continue olaparib 300 mg twice daily.  RTC 4 weeks for follow-up with repeat labs.   2.  Lower  back/left upper quadrant pain: - Continue oxycodone 20 mg every 4-6 hours as needed.   3.  Macrocytic anemia: - Continue folic acid daily.  4.  Iron deficiency state: - Ferritin is 26 and percent saturation is 18.  Hemoglobin is 11.  She complains of severe fatigue.  Recommend Feraheme x2.  5.  Constipation: - Use Linzess as needed.  6.  Hypomagnesemia: - Continue magnesium twice daily.  Magnesium level  is normal today.   Orders placed this encounter:  Orders Placed This Encounter  Procedures   [place], MD [place] [phone]

## 2021-09-12 NOTE — Progress Notes (Signed)
Patient is taking Lynparza as prescribed.  She has not missed any doses and reports no side effects at this time.   

## 2021-09-13 ENCOUNTER — Other Ambulatory Visit (HOSPITAL_COMMUNITY): Payer: Self-pay

## 2021-09-20 ENCOUNTER — Other Ambulatory Visit (HOSPITAL_COMMUNITY): Payer: Self-pay

## 2021-09-20 ENCOUNTER — Inpatient Hospital Stay: Payer: Medicare Other | Attending: Hematology

## 2021-09-20 VITALS — BP 102/60 | HR 77 | Temp 97.0°F | Resp 20

## 2021-09-20 DIAGNOSIS — C563 Malignant neoplasm of bilateral ovaries: Secondary | ICD-10-CM | POA: Insufficient documentation

## 2021-09-20 DIAGNOSIS — D509 Iron deficiency anemia, unspecified: Secondary | ICD-10-CM | POA: Insufficient documentation

## 2021-09-20 DIAGNOSIS — Z95828 Presence of other vascular implants and grafts: Secondary | ICD-10-CM

## 2021-09-20 DIAGNOSIS — D649 Anemia, unspecified: Secondary | ICD-10-CM

## 2021-09-20 MED ORDER — LORATADINE 10 MG PO TABS
10.0000 mg | ORAL_TABLET | Freq: Once | ORAL | Status: AC
  Administered 2021-09-20: 10 mg via ORAL
  Filled 2021-09-20: qty 1

## 2021-09-20 MED ORDER — SODIUM CHLORIDE 0.9 % IV SOLN
510.0000 mg | Freq: Once | INTRAVENOUS | Status: AC
  Administered 2021-09-20: 510 mg via INTRAVENOUS
  Filled 2021-09-20: qty 510

## 2021-09-20 MED ORDER — SODIUM CHLORIDE 0.9% FLUSH: 3.0000 mL | Freq: Once | INTRAVENOUS | Status: DC | PRN

## 2021-09-20 MED ORDER — HEPARIN SOD (PORK) LOCK FLUSH 100 UNIT/ML IV SOLN
500.0000 [IU] | Freq: Once | INTRAVENOUS | Status: AC
  Administered 2021-09-20: 500 [IU] via INTRAVENOUS

## 2021-09-20 MED ORDER — FAMOTIDINE 20 MG PO TABS
20.0000 mg | ORAL_TABLET | Freq: Once | ORAL | Status: AC
  Administered 2021-09-20: 20 mg via ORAL
  Filled 2021-09-20: qty 1

## 2021-09-20 MED ORDER — SODIUM CHLORIDE 0.9 % IV SOLN: Freq: Once | INTRAVENOUS | Status: AC

## 2021-09-20 MED ORDER — SODIUM CHLORIDE 0.9% FLUSH
10.0000 mL | INTRAVENOUS | Status: DC | PRN
  Administered 2021-09-20: 10 mL via INTRAVENOUS

## 2021-09-20 NOTE — Progress Notes (Signed)
Patient presents today for iron infusion.  Patient is in satisfactory condition with no new complaints voiced.  Vital signs are stable.  We will proceed with infusion per provider orders.   Patient tolerated treatment well with no complaints voiced.  Patient left ambulatory in stable condition.  Vital signs stable at discharge.  Follow up as scheduled.    

## 2021-09-20 NOTE — Patient Instructions (Signed)
[facility]  Discharge Instructions: Thank you for choosing [facility] to provide your oncology and hematology care.  If you have a lab appointment with the Cancer Center, please come in thru the Main Entrance and check in at the main information desk.  Wear comfortable clothing and clothing appropriate for easy access to any Portacath or PICC line.   We strive to give you quality time with your provider. You may need to reschedule your appointment if you arrive late (15 or more minutes).  Arriving late affects you and other patients whose appointments are after yours.  Also, if you miss three or more appointments without notifying the office, you may be dismissed from the clinic at the provider's discretion.      For prescription refill requests, have your pharmacy contact our office and allow 72 hours for refills to be completed.    Ferumoxytol Injection What is this medication? FERUMOXYTOL (FER ue MOX i tol) treats low levels of iron in your body (iron deficiency anemia). Iron is a mineral that plays an important role in making red blood cells, which carry oxygen from your lungs to the rest of your body. This medicine may be used for other purposes; ask your health care provider or pharmacist if you have questions. COMMON BRAND NAME(S): Feraheme What should I tell my care team before I take this medication? They need to know if you have any of these conditions: Anemia not caused by low iron levels High levels of iron in the blood Magnetic resonance imaging (MRI) test scheduled An unusual or allergic reaction to iron, other medications, foods, dyes, or preservatives Pregnant or trying to get pregnant Breast-feeding How should I use this medication? This medication is for injection into a vein. It is given in a hospital or clinic setting. Talk to your care team the use of this medication in children. Special care may be needed. Overdosage: If you think  you have taken too much of this medicine contact a poison control center or emergency room at once. NOTE: This medicine is only for you. Do not share this medicine with others. What if I miss a dose? It is important not to miss your dose. Call your care team if you are unable to keep an appointment. What may interact with this medication? Other iron products This list may not describe all possible interactions. Give your health care provider a list of all the medicines, herbs, non-prescription drugs, or dietary supplements you use. Also tell them if you smoke, drink alcohol, or use illegal drugs. Some items may interact with your medicine. What should I watch for while using this medication? Visit your care team regularly. Tell your care team if your symptoms do not start to get better or if they get worse. You may need blood work done while you are taking this medication. You may need to follow a special diet. Talk to your care team. Foods that contain iron include: whole grains/cereals, dried fruits, beans, or peas, leafy green vegetables, and organ meats (liver, kidney). What side effects may I notice from receiving this medication? Side effects that you should report to your care team as soon as possible: Allergic reactions--skin rash, itching, hives, swelling of the face, lips, tongue, or throat Low blood pressure--dizziness, feeling faint or lightheaded, blurry vision Shortness of breath Side effects that usually do not require medical attention (report to your care team if they continue or are bothersome): Flushing Headache Joint pain Muscle   pain Nausea Pain, redness, or irritation at injection site This list may not describe all possible side effects. Call your doctor for medical advice about side effects. You may report side effects to FDA at 1-800-FDA-1088. Where should I keep my medication? This medication is given in a hospital or clinic and will not be stored at home. NOTE: This  sheet is a summary. It may not cover all possible information. If you have questions about this medicine, talk to your doctor, pharmacist, or health care provider.  2023 Elsevier/Gold Standard (2020-05-26 00:00:00)    To help prevent nausea and vomiting after your treatment, we encourage you to take your nausea medication as directed.  BELOW ARE SYMPTOMS THAT SHOULD BE REPORTED IMMEDIATELY: *FEVER GREATER THAN 100.4 F (38 C) OR HIGHER *CHILLS OR SWEATING *NAUSEA AND VOMITING THAT IS NOT CONTROLLED WITH YOUR NAUSEA MEDICATION *UNUSUAL SHORTNESS OF BREATH *UNUSUAL BRUISING OR BLEEDING *URINARY PROBLEMS (pain or burning when urinating, or frequent urination) *BOWEL PROBLEMS (unusual diarrhea, constipation, pain near the anus) TENDERNESS IN MOUTH AND THROAT WITH OR WITHOUT PRESENCE OF ULCERS (sore throat, sores in mouth, or a toothache) UNUSUAL RASH, SWELLING OR PAIN  UNUSUAL VAGINAL DISCHARGE OR ITCHING   Items with * indicate a potential emergency and should be followed up as soon as possible or go to the Emergency Department if any problems should occur.  Please show the CHEMOTHERAPY ALERT CARD or IMMUNOTHERAPY ALERT CARD at check-in to the Emergency Department and triage nurse.  Should you have questions after your visit or need to cancel or reschedule your appointment, please contact [facility] [phone]  and follow the prompts.  Office hours are 8:00 a.m. to 4:30 p.m. Monday - Friday. Please note that voicemails left after 4:00 p.m. may not be returned until the following business day.  We are closed weekends and major holidays. You have access to a nurse at all times for urgent questions. Please call the main number to the clinic [phone] and follow the prompts.  For any non-urgent questions, you may also contact your provider using MyChart. We now offer e-Visits for anyone 18 and older to request care online for non-urgent symptoms. For details visit  [email or web address].   Also download the MyChart app! Go to the app store, search "MyChart", open the app, select [facility], and log in with your MyChart username and password.  Masks are optional in the cancer centers. If you would like for your care team to wear a mask while they are taking care of you, please let them know. You may have one support person who is at least 52 years old accompany you for your appointments.  

## 2021-09-21 DIAGNOSIS — R5383 Other fatigue: Secondary | ICD-10-CM | POA: Diagnosis not present

## 2021-09-21 DIAGNOSIS — Z299 Encounter for prophylactic measures, unspecified: Secondary | ICD-10-CM | POA: Diagnosis not present

## 2021-09-21 DIAGNOSIS — B029 Zoster without complications: Secondary | ICD-10-CM | POA: Diagnosis not present

## 2021-09-26 ENCOUNTER — Encounter (HOSPITAL_COMMUNITY): Payer: Self-pay | Admitting: Psychiatry

## 2021-09-26 ENCOUNTER — Ambulatory Visit (INDEPENDENT_AMBULATORY_CARE_PROVIDER_SITE_OTHER): Payer: Medicare Other | Admitting: Psychiatry

## 2021-09-26 DIAGNOSIS — F321 Major depressive disorder, single episode, moderate: Secondary | ICD-10-CM
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
## 2021-09-27 ENCOUNTER — Inpatient Hospital Stay: Payer: Medicare Other

## 2021-09-27 VITALS — BP 105/63 | HR 88 | Temp 97.0°F | Resp 18

## 2021-09-27 DIAGNOSIS — C563 Malignant neoplasm of bilateral ovaries: Secondary | ICD-10-CM | POA: Diagnosis not present

## 2021-09-27 DIAGNOSIS — D509 Iron deficiency anemia, unspecified: Secondary | ICD-10-CM | POA: Diagnosis not present

## 2021-09-27 MED ORDER — FAMOTIDINE 20 MG PO TABS
20.0000 mg | ORAL_TABLET | Freq: Once | ORAL | Status: AC
  Administered 2021-09-27: 20 mg via ORAL
  Filled 2021-09-27: qty 1

## 2021-09-27 MED ORDER — LORATADINE 10 MG PO TABS
10.0000 mg | ORAL_TABLET | Freq: Once | ORAL | Status: AC
  Administered 2021-09-27: 10 mg via ORAL
  Filled 2021-09-27: qty 1

## 2021-09-27 MED ORDER — SODIUM CHLORIDE 0.9 % IV SOLN
510.0000 mg | Freq: Once | INTRAVENOUS | Status: AC
  Administered 2021-09-27: 510 mg via INTRAVENOUS
  Filled 2021-09-27: qty 510

## 2021-09-27 MED ORDER — SODIUM CHLORIDE 0.9 % IV SOLN: Freq: Once | INTRAVENOUS | Status: AC

## 2021-09-27 MED ORDER — SODIUM CHLORIDE 0.9% FLUSH
10.0000 mL | Freq: Once | INTRAVENOUS | Status: AC | PRN
  Administered 2021-09-27: 10 mL

## 2021-09-27 MED ORDER — HEPARIN SOD (PORK) LOCK FLUSH 100 UNIT/ML IV SOLN
500.0000 [IU] | Freq: Once | INTRAVENOUS | Status: AC | PRN
  Administered 2021-09-27: 500 [IU]

## 2021-09-27 NOTE — Patient Instructions (Signed)
[facility]  Discharge Instructions: Thank you for choosing [facility] to provide your oncology and hematology care.  If you have a lab appointment with the Cancer Center, please come in thru the Main Entrance and check in at the main information desk.  Wear comfortable clothing and clothing appropriate for easy access to any Portacath or PICC line.   We strive to give you quality time with your provider. You may need to reschedule your appointment if you arrive late (15 or more minutes).  Arriving late affects you and other patients whose appointments are after yours.  Also, if you miss three or more appointments without notifying the office, you may be dismissed from the clinic at the provider's discretion.      For prescription refill requests, have your pharmacy contact our office and allow 72 hours for refills to be completed.    Today you received the following chemotherapy and/or immunotherapy agents Feraheme.  Ferumoxytol Injection What is this medication? FERUMOXYTOL (FER ue MOX i tol) treats low levels of iron in your body (iron deficiency anemia). Iron is a mineral that plays an important role in making red blood cells, which carry oxygen from your lungs to the rest of your body. This medicine may be used for other purposes; ask your health care provider or pharmacist if you have questions. COMMON BRAND NAME(S): Feraheme What should I tell my care team before I take this medication? They need to know if you have any of these conditions: Anemia not caused by low iron levels High levels of iron in the blood Magnetic resonance imaging (MRI) test scheduled An unusual or allergic reaction to iron, other medications, foods, dyes, or preservatives Pregnant or trying to get pregnant Breast-feeding How should I use this medication? This medication is for injection into a vein. It is given in a hospital or clinic setting. Talk to your care team the use  of this medication in children. Special care may be needed. Overdosage: If you think you have taken too much of this medicine contact a poison control center or emergency room at once. NOTE: This medicine is only for you. Do not share this medicine with others. What if I miss a dose? It is important not to miss your dose. Call your care team if you are unable to keep an appointment. What may interact with this medication? Other iron products This list may not describe all possible interactions. Give your health care provider a list of all the medicines, herbs, non-prescription drugs, or dietary supplements you use. Also tell them if you smoke, drink alcohol, or use illegal drugs. Some items may interact with your medicine. What should I watch for while using this medication? Visit your care team regularly. Tell your care team if your symptoms do not start to get better or if they get worse. You may need blood work done while you are taking this medication. You may need to follow a special diet. Talk to your care team. Foods that contain iron include: whole grains/cereals, dried fruits, beans, or peas, leafy green vegetables, and organ meats (liver, kidney). What side effects may I notice from receiving this medication? Side effects that you should report to your care team as soon as possible: Allergic reactions--skin rash, itching, hives, swelling of the face, lips, tongue, or throat Low blood pressure--dizziness, feeling faint or lightheaded, blurry vision Shortness of breath Side effects that usually do not require medical attention (report to your care team   if they continue or are bothersome): Flushing Headache Joint pain Muscle pain Nausea Pain, redness, or irritation at injection site This list may not describe all possible side effects. Call your doctor for medical advice about side effects. You may report side effects to FDA at 1-800-FDA-1088. Where should I keep my medication? This  medication is given in a hospital or clinic and will not be stored at home. NOTE: This sheet is a summary. It may not cover all possible information. If you have questions about this medicine, talk to your doctor, pharmacist, or health care provider.  2023 Elsevier/Gold Standard (2020-05-26 00:00:00)       To help prevent nausea and vomiting after your treatment, we encourage you to take your nausea medication as directed.  BELOW ARE SYMPTOMS THAT SHOULD BE REPORTED IMMEDIATELY: *FEVER GREATER THAN 100.4 F (38 C) OR HIGHER *CHILLS OR SWEATING *NAUSEA AND VOMITING THAT IS NOT CONTROLLED WITH YOUR NAUSEA MEDICATION *UNUSUAL SHORTNESS OF BREATH *UNUSUAL BRUISING OR BLEEDING *URINARY PROBLEMS (pain or burning when urinating, or frequent urination) *BOWEL PROBLEMS (unusual diarrhea, constipation, pain near the anus) TENDERNESS IN MOUTH AND THROAT WITH OR WITHOUT PRESENCE OF ULCERS (sore throat, sores in mouth, or a toothache) UNUSUAL RASH, SWELLING OR PAIN  UNUSUAL VAGINAL DISCHARGE OR ITCHING   Items with * indicate a potential emergency and should be followed up as soon as possible or go to the Emergency Department if any problems should occur.  Please show the CHEMOTHERAPY ALERT CARD or IMMUNOTHERAPY ALERT CARD at check-in to the Emergency Department and triage nurse.  Should you have questions after your visit or need to cancel or reschedule your appointment, please contact [facility] [phone]  and follow the prompts.  Office hours are 8:00 a.m. to 4:30 p.m. Monday - Friday. Please note that voicemails left after 4:00 p.m. may not be returned until the following business day.  We are closed weekends and major holidays. You have access to a nurse at all times for urgent questions. Please call the main number to the clinic [phone] and follow the prompts.  For any non-urgent questions, you may also contact your provider using MyChart. We now offer e-Visits for  anyone 18 and older to request care online for non-urgent symptoms. For details visit [email or web address].   Also download the MyChart app! Go to the app store, search "MyChart", open the app, select [facility], and log in with your MyChart username and password.  Masks are optional in the cancer centers. If you would like for your care team to wear a mask while they are taking care of you, please let them know. You may have one support person who is at least 52 years old accompany you for your appointments.  

## 2021-09-27 NOTE — Progress Notes (Signed)
Patient presents today for Feraheme IV iron infusion. Vital signs are stable. Patient denies any changes since the last iron infusion. Patient denies any complaints today. MAR reviewed and updated.    Feraheme given today per MD orders. Tolerated infusion without adverse affects. Vital signs stable. No complaints at this time. Discharged from clinic ambulatory in stable condition. Alert and oriented x 3. F/U with [facility] as scheduled.

## 2021-10-03 ENCOUNTER — Other Ambulatory Visit: Payer: Self-pay | Admitting: *Deleted

## 2021-10-03 MED ORDER — OXYCODONE HCL 20 MG PO TABS: 20.0000 mg | ORAL_TABLET | ORAL | 0 refills | Status: DC | PRN

## 2021-10-05 ENCOUNTER — Telehealth: Payer: Self-pay

## 2021-10-05 ENCOUNTER — Other Ambulatory Visit: Payer: Self-pay | Admitting: Physician Assistant

## 2021-10-05 DIAGNOSIS — R232 Flushing: Secondary | ICD-10-CM

## 2021-10-05 DIAGNOSIS — G6289 Other specified polyneuropathies: Secondary | ICD-10-CM

## 2021-10-05 MED ORDER — GABAPENTIN 100 MG PO CAPS: 200.0000 mg | ORAL_CAPSULE | Freq: Every evening | ORAL | 2 refills | Status: DC | PRN

## 2021-10-05 NOTE — Telephone Encounter (Signed)
Patient notified of Gabapentin prescription and to call/mychart on Monday for improvements or worsening of hot flashes.  Verbalized understanding.

## 2021-10-05 NOTE — Progress Notes (Signed)
Gabapentin reordered to help with increased hot flashes while tapering off of venlafaxine.

## 2021-10-08 ENCOUNTER — Other Ambulatory Visit: Payer: Self-pay | Admitting: *Deleted

## 2021-10-08 ENCOUNTER — Other Ambulatory Visit (HOSPITAL_COMMUNITY): Payer: Self-pay

## 2021-10-08 DIAGNOSIS — R110 Nausea: Secondary | ICD-10-CM

## 2021-10-08 DIAGNOSIS — R42 Dizziness and giddiness: Secondary | ICD-10-CM

## 2021-10-08 NOTE — Progress Notes (Unsigned)
Michelle Holt. [address], [place] [address] Phone: [phone] Fax: Michelle Holt PROGRESS NOTE   [name] [id number] October 02, 1970 51 y.o.  Michelle Holt is managed by Dr. Delton Coombes for high-grade serous ovarian cancer, stage IV  Actively treated with chemotherapy/immunotherapy/hormonal therapy: YES  Current therapy: Olaparib  Last treated: Daily since 08/22/2021  Next scheduled appointment with provider: 10/17/2021  Subjective:  Chief Complaint: Nausea and dizziness  Michelle Holt is seen today due to multiple symptoms related to her cancer diagnosis and treatment.  NAUSEA: Patient has had nausea ever since she started her Lynparza, ranges from mild to severe.  She takes Phenergan 30 minutes before Lynparza and Zofran as needed for breakthrough nausea, with little relief.  She denies any vomiting.  Took Compazine in the past while receiving IV chemotherapy, and this worked well.  DIZZINESS: She reports onset of dizziness after restarting gabapentin.  Dizziness is described as "things spinning but slowly" and feeling off balance.  Although her symptoms are brought on by position changes and turning her head, she says that it feels different from prior episodes of vertigo.  HOT FLASHES: Patient has been weaning off of Effexor due to medication-related weight gain, but reports severe hot flashes and night sweats since discontinuing Effexor.  She tried gabapentin nightly, but without relief.   Review of Systems:  Review of Systems  Constitutional:  Positive for diaphoresis (hot flashes w/ night sweats). Negative for activity change, appetite change, chills, fatigue, fever and unexpected weight change.  HENT:  Negative for mouth sores, nosebleeds, sore throat and trouble swallowing.   Respiratory:  Positive for shortness of breath. Negative for cough.   Cardiovascular:  Negative for chest pain, palpitations and  leg swelling.  Gastrointestinal:  Positive for abdominal pain, nausea and vomiting ("dry heaves"). Negative for blood in stool, constipation and diarrhea.  Genitourinary:  Negative for dysuria and hematuria.  Musculoskeletal:  Positive for arthralgias.  Neurological:  Positive for dizziness, numbness and headaches. Negative for light-headedness.  Psychiatric/Behavioral:  Positive for dysphoric mood and sleep disturbance. The patient is nervous/anxious.      Past Medical History, Surgical history, Social history, and Family history were reviewed as documented elsewhere in chart, and were updated as appropriate.   Assessment & Plan:    1.  Nausea - Reports nausea ever since she started her Lynparza, ranges from mild to severe.  No vomiting. - Takes Phenergan 30 minutes before Lynparza and Zofran as needed for breakthrough nausea, with little relief. - Took Compazine in the past while receiving IV chemotherapy, and this worked well. - PLAN: Discontinue Zofran. - Prescription sent for Compazine to be taken twice daily 30 minutes before Lynparza. - Phenergan to be used as needed for breakthrough nausea. - We will discuss further at her appointment with Dr. Delton Coombes next week to see if any dose changes to her Lonie Peak are needed.  2.  Dizziness - Onset of dizziness after restarting gabapentin.  Dizziness is described as "things spinning but slowly" and feeling off balance.  Although her symptoms are brought on by position changes and turning her head, she says that it feels different from prior episodes of vertigo. - CBC today negative for anemia (Hgb 13.0) - Blood pressure 110/57, heart rate 94 - Patient refused Dix-Hallpike maneuver on exam - PLAN: Dizziness may be related to side effect of gabapentin or possible vertigo - Gabapentin discontinued.  If dizziness does not improve after 3 to  4 days without gabapentin, patient instructed to call our office and we will send prescription for  meclizine and/or referral to vestibular PT.  3.  Hypomagnesemia   - Magnesium today mildly low at 1.6 - Ran out of magnesium 1 week ago - PLAN: Refill prescription sent to pharmacy for magnesium supplement to be taken twice daily.  4.  Hot flashes - Weaning off of Effexor due to medication-related weight gain, but reports severe hot flashes and night sweats since discontinuing Effexor.  She tried gabapentin nightly, but without relief. - PLAN: Gabapentin discontinued due to side effects/intolerance (dizziness). - We will restart Effexor 75 mg daily  5.  Cancer related pain - Patient taking oxycodone 20 mg immediate release every 4 hours as needed for pain - Her 2 local pharmacies is out of this dose of oxycodone IR - PLAN: 30-day prescription sent for oxycodone IR 15 mg + oxycodone IR 5 mg, with instructions to take these together (total dose 20 mg) every 4 hours as needed for cancer related pain  6.  Transaminitis, mild - CMP today shows mildly elevated LFTs with AST 63 and ALT 81 - Differential diagnosis includes transaminitis related to olaparib - PLAN: Repeat CMP next week prior to visit with MD.  7.  High-grade serous ovarian cancer, stage IV - Primary oncologist is Dr. Delton Coombes - Received carboplatin and paclitaxel x 7 cycles from 08/24/2018 to 03/15/2019. - Underwent laparoscopic TAH & BSO & omenectomy on 12/24/2018. - Additional 6 cycles of carboplatin and paclitaxel from 03/05/2021 to 06/18/2021 - CT CAP (07/12/2021): No evidence of recurrence or metastatic disease in chest, abdomen, or pelvis.  Previously seen peritoneal, omental and nodal metastatic disease is resolved. - Currently on oral maintenance chemotherapy with olaparib Lonie Peak) 300 mg twice daily since 08/22/2021 - PLAN: Continue olaparib at current dose. - Follow-up next week with labs on 10/16/2021 and office visit with Dr. Delton Coombes on 10/17/2021.   Objective:   Physical Exam:  LMP  (LMP Unknown)  ECOG:  2  Physical Exam Vitals reviewed.  Constitutional:      Appearance: Normal appearance. She is obese.  Cardiovascular:     Rate and Rhythm: Normal rate and regular rhythm.     Pulses: Normal pulses.     Heart sounds: Normal heart sounds.  Pulmonary:     Effort: Pulmonary effort is normal.     Breath sounds: Normal breath sounds.  Neurological:     General: No focal deficit present.     Mental Status: She is alert and oriented to person, place, and time.  Psychiatric:        Mood and Affect: Mood normal.        Behavior: Behavior normal.     Lab Review:     Component Value Date/Time   NA 139 09/03/2021 1433   K 3.7 09/03/2021 1433   CL 104 09/03/2021 1433   CO2 27 09/03/2021 1433   GLUCOSE 107 (H) 09/03/2021 1433   BUN 10 09/03/2021 1433   CREATININE 0.60 09/03/2021 1433   CALCIUM 8.9 09/03/2021 1433   PROT 7.0 09/03/2021 1433   ALBUMIN 3.6 09/03/2021 1433   AST 18 09/03/2021 1433   ALT 15 09/03/2021 1433   ALKPHOS 80 09/03/2021 1433   BILITOT 0.3 09/03/2021 1433   GFRNONAA >60 09/03/2021 1433   GFRAA >60 10/07/2019 1038       Component Value Date/Time   WBC 5.6 09/03/2021 1433   RBC 2.77 (L) 09/03/2021 1433   HGB 11.0 (  L) 09/03/2021 1433   HCT 33.1 (L) 09/03/2021 1433   PLT 272 09/03/2021 1433   MCV 119.5 (H) 09/03/2021 1433   MCH 39.7 (H) 09/03/2021 1433   MCHC 33.2 09/03/2021 1433   RDW 13.5 09/03/2021 1433   LYMPHSABS 1.9 09/03/2021 1433   MONOABS 0.5 09/03/2021 1433   EOSABS 0.2 09/03/2021 1433   BASOSABS 0.0 09/03/2021 1433   -------------------------------  Imaging from last 24 hours (if applicable):  Radiology interpretation: No results found.    Wrap-Up:    All questions were answered. The patient knows to call the clinic with any problems, questions or concerns.  Medical decision making: Moderate  Time spent on visit: I spent 25 minutes counseling the patient face to face. The total time spent in the appointment was 40 minutes and more  than 50% was on counseling.   Harriett Rush, PA-C  10/10/2021 4:40 PM

## 2021-10-09 ENCOUNTER — Inpatient Hospital Stay: Payer: Medicare Other

## 2021-10-09 ENCOUNTER — Inpatient Hospital Stay (HOSPITAL_BASED_OUTPATIENT_CLINIC_OR_DEPARTMENT_OTHER): Payer: Medicare Other | Admitting: Physician Assistant

## 2021-10-09 ENCOUNTER — Encounter: Payer: Self-pay | Admitting: Urology

## 2021-10-09 ENCOUNTER — Other Ambulatory Visit (INDEPENDENT_AMBULATORY_CARE_PROVIDER_SITE_OTHER): Payer: Self-pay | Admitting: Internal Medicine

## 2021-10-09 ENCOUNTER — Other Ambulatory Visit: Payer: Self-pay | Admitting: *Deleted

## 2021-10-09 VITALS — BP 110/57 | HR 94 | Temp 97.5°F | Resp 18 | Ht 64.0 in | Wt 188.7 lb

## 2021-10-09 DIAGNOSIS — G893 Neoplasm related pain (acute) (chronic): Secondary | ICD-10-CM | POA: Diagnosis not present

## 2021-10-09 DIAGNOSIS — R110 Nausea: Secondary | ICD-10-CM | POA: Diagnosis not present

## 2021-10-09 DIAGNOSIS — T451X5A Adverse effect of antineoplastic and immunosuppressive drugs, initial encounter: Secondary | ICD-10-CM | POA: Diagnosis not present

## 2021-10-09 DIAGNOSIS — C563 Malignant neoplasm of bilateral ovaries: Secondary | ICD-10-CM | POA: Diagnosis not present

## 2021-10-09 DIAGNOSIS — R232 Flushing: Secondary | ICD-10-CM

## 2021-10-09 DIAGNOSIS — R42 Dizziness and giddiness: Secondary | ICD-10-CM

## 2021-10-09 DIAGNOSIS — D509 Iron deficiency anemia, unspecified: Secondary | ICD-10-CM | POA: Diagnosis not present

## 2021-10-09 LAB — CBC WITH DIFFERENTIAL/PLATELET
Abs Immature Granulocytes: 0.02 10*3/uL (ref 0.00–0.07)
Basophils Absolute: 0 10*3/uL (ref 0.0–0.1)
Basophils Relative: 1 %
Eosinophils Absolute: 0.2 10*3/uL (ref 0.0–0.5)
Eosinophils Relative: 3 %
HCT: 37.6 % (ref 36.0–46.0)
Hemoglobin: 13 g/dL (ref 12.0–15.0)
Immature Granulocytes: 0 %
Lymphocytes Relative: 31 %
Lymphs Abs: 1.6 10*3/uL (ref 0.7–4.0)
MCH: 40.1 pg — ABNORMAL HIGH (ref 26.0–34.0)
MCHC: 34.6 g/dL (ref 30.0–36.0)
MCV: 116 fL — ABNORMAL HIGH (ref 80.0–100.0)
Monocytes Absolute: 0.4 10*3/uL (ref 0.1–1.0)
Monocytes Relative: 8 %
Neutro Abs: 3 10*3/uL (ref 1.7–7.7)
Neutrophils Relative %: 57 %
Platelets: 217 10*3/uL (ref 150–400)
RBC: 3.24 MIL/uL — ABNORMAL LOW (ref 3.87–5.11)
RDW: 15.3 % (ref 11.5–15.5)
WBC: 5.1 10*3/uL (ref 4.0–10.5)
nRBC: 0.6 % — ABNORMAL HIGH (ref 0.0–0.2)

## 2021-10-09 LAB — COMPREHENSIVE METABOLIC PANEL
ALT: 81 U/L — ABNORMAL HIGH (ref 0–44)
AST: 63 U/L — ABNORMAL HIGH (ref 15–41)
Albumin: 3.7 g/dL (ref 3.5–5.0)
Alkaline Phosphatase: 115 U/L (ref 38–126)
Anion gap: 8 (ref 5–15)
BUN: 11 mg/dL (ref 6–20)
CO2: 28 mmol/L (ref 22–32)
Calcium: 9.6 mg/dL (ref 8.9–10.3)
Chloride: 103 mmol/L (ref 98–111)
Creatinine, Ser: 0.79 mg/dL (ref 0.44–1.00)
GFR, Estimated: >60 mL/min (ref >60)
Glucose, Bld: 113 mg/dL — ABNORMAL HIGH (ref 70–99)
Potassium: 4.3 mmol/L (ref 3.5–5.1)
Sodium: 139 mmol/L (ref 135–145)
Total Bilirubin: 0.6 mg/dL (ref 0.3–1.2)
Total Protein: 7.2 g/dL (ref 6.5–8.1)

## 2021-10-09 LAB — MAGNESIUM: Magnesium: 1.6 mg/dL — ABNORMAL LOW (ref 1.7–2.4)

## 2021-10-09 MED ORDER — VENLAFAXINE HCL 75 MG PO TABS: 75.0000 mg | ORAL_TABLET | Freq: Every day | ORAL | 3 refills | Status: DC

## 2021-10-09 MED ORDER — PROCHLORPERAZINE MALEATE 10 MG PO TABS: 10.0000 mg | ORAL_TABLET | Freq: Two times a day (BID) | ORAL | 3 refills | Status: DC | PRN

## 2021-10-09 MED ORDER — MAGNESIUM OXIDE -MG SUPPLEMENT 400 (240 MG) MG PO TABS: 400.0000 mg | ORAL_TABLET | Freq: Two times a day (BID) | ORAL | 6 refills | Status: DC

## 2021-10-09 MED ORDER — OXYCODONE HCL 20 MG PO TABS: 20.0000 mg | ORAL_TABLET | ORAL | 0 refills | Status: DC | PRN

## 2021-10-09 MED ORDER — OXYCODONE HCL 15 MG PO TABS: 15.0000 mg | ORAL_TABLET | ORAL | 0 refills | Status: DC | PRN

## 2021-10-09 MED ORDER — PROMETHAZINE HCL 25 MG PO TABS: 25.0000 mg | ORAL_TABLET | Freq: Four times a day (QID) | ORAL | 4 refills | Status: DC | PRN

## 2021-10-09 MED ORDER — OXYCODONE HCL 5 MG PO TABS: 5.0000 mg | ORAL_TABLET | ORAL | 0 refills | Status: DC | PRN

## 2021-10-09 NOTE — Patient Instructions (Signed)
[place] at [place] **   You were seen today by Tarri Abernethy PA-C for your symptom management visit.    HOT FLASHES We will STOP gabapentin. We will RESTART Effexor at 75 mg daily.  DIZZINESS This may be a side effect of the gabapentin or possibly vertigo related to your inner ear. If your dizziness does not get better over the next 4 to 5 days, please call our office and I will send a prescription for meclizine to help with the dizziness.  NAUSEA We will STOP your Zofran. We will send prescription for Compazine to be taken twice daily, 30 minutes before your Lynparza. You can use Phenergan as needed for breakthrough nausea. If you are still having severe nausea, please let us know and we will discuss other medication options and possible dose changes at your next visit with Dr. Delton Coombes.  PAIN MEDICATION Since your pharmacy does not have oxycodone 20 mg, we will send a prescription for oxycodone 15 mg + oxycodone 5 mg.  You should take these together, so that they still equal 20 mg total.  You can take them every 4 hours as needed for pain (just like you would your 20 mg dose of oxycodone).  LOW MAGNESIUM New prescription for magnesium has been sent to your pharmacy.  Continue to take this twice daily.   FOLLOW-UP APPOINTMENT: You are scheduled for repeat labs on 10/16/2021.  You are scheduled for follow-up visit with Dr. Delton Coombes on 10/17/2021.  Please contact our office if you have any additional needs before your next visit.  ** Thank you for trusting me with your healthcare!  I strive to provide all of my patients with quality care at each visit.  If you receive a survey for this visit, I would be so grateful to you for taking the time to provide feedback.  Thank you in advance!  ~ [facility]                   Dr. Derek Jack   &   Tarri Abernethy, PA-C   - - - - - - - - - - - - - - - - - -     Thank you for choosing [place] at [facility] to provide your oncology and hematology care.  To afford each patient quality time with our provider, please arrive at least 15 minutes before your scheduled appointment time.   If you have a lab appointment with the [place] please come in thru the Main Entrance and check in at the main information desk.  You need to re-schedule your appointment should you arrive 10 or more minutes late.  We strive to give you quality time with our providers, and arriving late affects you and other patients whose appointments are after yours.  Also, if you no show three or more times for appointments you may be dismissed from the clinic at the providers discretion.     Again, thank you for choosing [facility].  Our hope is that these requests will decrease the amount of time that you wait before being seen by our physicians.       _____________________________________________________________  Should you have questions after your visit to [facility], please contact our office at [phone] and follow the prompts.  Our office hours are 8:00 a.m. and 4:30 p.m. Monday - Friday.  Please note that voicemails left  after 4:00 p.m. may not be returned until the following business day.  We are closed weekends and major holidays.  You do have access to a nurse 24-7, just call the main number to the clinic [phone] and do not press any options, hold on the line and a nurse will answer the phone.    For prescription refill requests, have your pharmacy contact our office and allow 72 hours.

## 2021-10-10 ENCOUNTER — Encounter (HOSPITAL_COMMUNITY): Payer: Self-pay | Admitting: Hematology

## 2021-10-10 ENCOUNTER — Encounter: Payer: Self-pay | Admitting: Hematology

## 2021-10-15 ENCOUNTER — Other Ambulatory Visit (HOSPITAL_COMMUNITY): Payer: Self-pay

## 2021-10-16 ENCOUNTER — Inpatient Hospital Stay: Payer: Medicare Other | Attending: Hematology

## 2021-10-16 DIAGNOSIS — D509 Iron deficiency anemia, unspecified: Secondary | ICD-10-CM | POA: Insufficient documentation

## 2021-10-16 DIAGNOSIS — C563 Malignant neoplasm of bilateral ovaries: Secondary | ICD-10-CM | POA: Insufficient documentation

## 2021-10-16 DIAGNOSIS — Z79899 Other long term (current) drug therapy: Secondary | ICD-10-CM | POA: Insufficient documentation

## 2021-10-16 DIAGNOSIS — K5900 Constipation, unspecified: Secondary | ICD-10-CM | POA: Insufficient documentation

## 2021-10-16 DIAGNOSIS — Z87891 Personal history of nicotine dependence: Secondary | ICD-10-CM | POA: Diagnosis not present

## 2021-10-16 LAB — CBC WITH DIFFERENTIAL/PLATELET
Abs Immature Granulocytes: 0.01 10*3/uL (ref 0.00–0.07)
Basophils Absolute: 0 10*3/uL (ref 0.0–0.1)
Basophils Relative: 1 %
Eosinophils Absolute: 0.2 10*3/uL (ref 0.0–0.5)
Eosinophils Relative: 4 %
HCT: 32.4 % — ABNORMAL LOW (ref 36.0–46.0)
Hemoglobin: 11 g/dL — ABNORMAL LOW (ref 12.0–15.0)
Immature Granulocytes: 0 %
Lymphocytes Relative: 41 %
Lymphs Abs: 2.1 10*3/uL (ref 0.7–4.0)
MCH: 39.3 pg — ABNORMAL HIGH (ref 26.0–34.0)
MCHC: 34 g/dL (ref 30.0–36.0)
MCV: 115.7 fL — ABNORMAL HIGH (ref 80.0–100.0)
Monocytes Absolute: 0.4 10*3/uL (ref 0.1–1.0)
Monocytes Relative: 8 %
Neutro Abs: 2.5 10*3/uL (ref 1.7–7.7)
Neutrophils Relative %: 46 %
Platelets: 239 10*3/uL (ref 150–400)
RBC: 2.8 MIL/uL — ABNORMAL LOW (ref 3.87–5.11)
RDW: 16.1 % — ABNORMAL HIGH (ref 11.5–15.5)
WBC Morphology: REACTIVE
WBC: 5.2 10*3/uL (ref 4.0–10.5)
nRBC: 0.6 % — ABNORMAL HIGH (ref 0.0–0.2)

## 2021-10-16 LAB — COMPREHENSIVE METABOLIC PANEL
ALT: 40 U/L (ref 0–44)
AST: 24 U/L (ref 15–41)
Albumin: 3.6 g/dL (ref 3.5–5.0)
Alkaline Phosphatase: 126 U/L (ref 38–126)
Anion gap: 9 (ref 5–15)
BUN: 11 mg/dL (ref 6–20)
CO2: 25 mmol/L (ref 22–32)
Calcium: 8.9 mg/dL (ref 8.9–10.3)
Chloride: 103 mmol/L (ref 98–111)
Creatinine, Ser: 0.78 mg/dL (ref 0.44–1.00)
GFR, Estimated: >60 mL/min (ref >60)
Glucose, Bld: 138 mg/dL — ABNORMAL HIGH (ref 70–99)
Potassium: 3.7 mmol/L (ref 3.5–5.1)
Sodium: 137 mmol/L (ref 135–145)
Total Bilirubin: 0.7 mg/dL (ref 0.3–1.2)
Total Protein: 6.7 g/dL (ref 6.5–8.1)

## 2021-10-16 LAB — MAGNESIUM: Magnesium: 1.7 mg/dL (ref 1.7–2.4)

## 2021-10-16 MED ORDER — SODIUM CHLORIDE 0.9% FLUSH
10.0000 mL | Freq: Once | INTRAVENOUS | Status: AC
  Administered 2021-10-16: 10 mL via INTRAVENOUS

## 2021-10-16 MED ORDER — HEPARIN SOD (PORK) LOCK FLUSH 100 UNIT/ML IV SOLN
500.0000 [IU] | Freq: Once | INTRAVENOUS | Status: AC
  Administered 2021-10-16: 500 [IU] via INTRAVENOUS

## 2021-10-16 NOTE — Progress Notes (Signed)
Patients port flushed without difficulty.  Good blood return noted with no bruising or swelling noted at site.  Band aid applied.  VSS with discharge and left in satisfactory condition with no s/s of distress noted.   

## 2021-10-16 NOTE — Patient Instructions (Signed)
[place]  Discharge Instructions: Thank you for choosing [place] to provide your oncology and hematology care.  If you have a lab appointment with the [facility], please come in thru the Main Entrance and check in at the main information desk.  Wear comfortable clothing and clothing appropriate for easy access to any Portacath or PICC line.   We strive to give you quality time with your provider. You may need to reschedule your appointment if you arrive late (15 or more minutes).  Arriving late affects you and other patients whose appointments are after yours.  Also, if you miss three or more appointments without notifying the office, you may be dismissed from the clinic at the provider's discretion.      For prescription refill requests, have your pharmacy contact our office and allow 72 hours for refills to be completed.    Today you received the following chemotherapy and/or immunotherapy agents [place] flush lab      To help prevent nausea and vomiting after your treatment, we encourage you to take your nausea medication as directed.  BELOW ARE SYMPTOMS THAT SHOULD BE REPORTED IMMEDIATELY: *FEVER GREATER THAN 100.4 F (38 C) OR HIGHER *CHILLS OR SWEATING *NAUSEA AND VOMITING THAT IS NOT CONTROLLED WITH YOUR NAUSEA MEDICATION *UNUSUAL SHORTNESS OF BREATH *UNUSUAL BRUISING OR BLEEDING *URINARY PROBLEMS (pain or burning when urinating, or frequent urination) *BOWEL PROBLEMS (unusual diarrhea, constipation, pain near the anus) TENDERNESS IN MOUTH AND THROAT WITH OR WITHOUT PRESENCE OF ULCERS (sore throat, sores in mouth, or a toothache) UNUSUAL RASH, SWELLING OR PAIN  UNUSUAL VAGINAL DISCHARGE OR ITCHING   Items with * indicate a potential emergency and should be followed up as soon as possible or go to the Emergency Department if any problems should occur.  Please show the CHEMOTHERAPY ALERT CARD or IMMUNOTHERAPY ALERT CARD at check-in to the  Emergency Department and triage nurse.  Should you have questions after your visit or need to cancel or reschedule your appointment, please contact [facility] [phone]  and follow the prompts.  Office hours are 8:00 a.m. to 4:30 p.m. Monday - Friday. Please note that voicemails left after 4:00 p.m. may not be returned until the following business day.  We are closed weekends and major holidays. You have access to a nurse at all times for urgent questions. Please call the main number to the clinic [phone] and follow the prompts.  For any non-urgent questions, you may also contact your provider using MyChart. We now offer e-Visits for anyone 48 and older to request care online for non-urgent symptoms. For details visit [email or web address].   Also download the MyChart app! Go to the app store, search "MyChart", open the app, select , and log in with your MyChart username and password.  Masks are optional in the cancer centers. If you would like for your care team to wear a mask while they are taking care of you, please let them know. You may have one support person who is at least 52 years old accompany you for your appointments.

## 2021-10-17 ENCOUNTER — Other Ambulatory Visit: Payer: Medicare Other

## 2021-10-17 ENCOUNTER — Inpatient Hospital Stay (HOSPITAL_BASED_OUTPATIENT_CLINIC_OR_DEPARTMENT_OTHER): Payer: Medicare Other | Admitting: Hematology

## 2021-10-17 ENCOUNTER — Other Ambulatory Visit: Payer: Self-pay | Admitting: *Deleted

## 2021-10-17 VITALS — BP 108/59 | HR 88 | Temp 98.9°F | Resp 17 | Ht 64.0 in | Wt 189.0 lb

## 2021-10-17 DIAGNOSIS — C563 Malignant neoplasm of bilateral ovaries: Secondary | ICD-10-CM

## 2021-10-17 DIAGNOSIS — D649 Anemia, unspecified: Secondary | ICD-10-CM

## 2021-10-17 DIAGNOSIS — Z79899 Other long term (current) drug therapy: Secondary | ICD-10-CM | POA: Diagnosis not present

## 2021-10-17 DIAGNOSIS — D509 Iron deficiency anemia, unspecified: Secondary | ICD-10-CM | POA: Diagnosis not present

## 2021-10-17 DIAGNOSIS — Z87891 Personal history of nicotine dependence: Secondary | ICD-10-CM | POA: Diagnosis not present

## 2021-10-17 DIAGNOSIS — K5900 Constipation, unspecified: Secondary | ICD-10-CM | POA: Diagnosis not present

## 2021-10-17 NOTE — Patient Instructions (Addendum)
[place] at [facility] Discharge Instructions   You were seen and examined today by Dr. Delton Coombes.  He reviewed the lab work which is normal/stable. Your cancer tumor marker results are still pending.   Continue Lynparza as prescribed.   We will see you back in 6 weeks. We will repeat lab work prior to your next visit.    Thank you for choosing [place] at [facility] to provide your oncology and hematology care.  To afford each patient quality time with our provider, please arrive at least 15 minutes before your scheduled appointment time.   If you have a lab appointment with the [facility] please come in thru the Main Entrance and check in at the main information desk.  You need to re-schedule your appointment should you arrive 10 or more minutes late.  We strive to give you quality time with our providers, and arriving late affects you and other patients whose appointments are after yours.  Also, if you no show three or more times for appointments you may be dismissed from the clinic at the providers discretion.     Again, thank you for choosing [facility].  Our hope is that these requests will decrease the amount of time that you wait before being seen by our physicians.       _____________________________________________________________  Should you have questions after your visit to [facility], please contact our office at [phone] and follow the prompts.  Our office hours are 8:00 a.m. and 4:30 p.m. Monday - Friday.  Please note that voicemails left after 4:00 p.m. may not be returned until the following business day.  We are closed weekends and major holidays.  You do have access to a nurse 24-7, just call the main number to the clinic [phone] and do not press any options, hold on the line and a nurse will answer the phone.    For prescription refill requests, have your pharmacy contact our  office and allow 72 hours.    Due to Covid, you will need to wear a mask upon entering the hospital. If you do not have a mask, a mask will be given to you at the Main Entrance upon arrival. For doctor visits, patients may have 1 support person age 20 or older with them. For treatment visits, patients can not have anyone with them due to social distancing guidelines and our immunocompromised population.

## 2021-10-17 NOTE — Progress Notes (Signed)
Patient is taking Lynparza as prescribed.  She has not missed any doses and reports no side effects at this time.   

## 2021-10-17 NOTE — Progress Notes (Signed)
Michelle Holt, Michelle Holt [id number]   CLINIC:  Medical Oncology/Hematology  PCP:  Michelle Chroman, MD [address] / [place] [address] [phone]   REASON FOR VISIT:  Follow-up for high-grade serous ovarian cancer  PRIOR THERAPY:  1. Carboplatin and paclitaxel x 7 cycles from 08/24/2018 to 03/15/2019. 2. Laparoscopic TAH & BSO & omenectomy on 12/24/2018. 3.  6 cycles of carboplatin and paclitaxel from 03/05/2021 to 06/18/2021  NGS Results: not done  CURRENT THERAPY: olaparib  BRIEF ONCOLOGIC HISTORY:  Oncology History  Ovarian cancer, bilateral ([place])  07/07/2018 Pathology Results   PLEURAL FLUID, LEFT (SPECIMEN 1 OF 1, COLLECTED 07/07/18): - MALIGNANT CELLS CONSISTENT WITH ADENOCARCINOMA - SEE COMMENT  Source Pleural Fluid, (Specimen 1 of 1, collected on 07/07/2018) Gross Specimen: Received is/are 1000cc of bloody red fluid with tissue. (TC:tc) Prepared: # Smears: 0 # Concentration Technique Slides (i.e. ThinPrep): 1 # Cell Block: 1 Conventional Additional Studies: Two Hematology slides labeled [id number] Comment The malignant cells are positive for cytokeratin 7, p53, WT-1, Pax-8, Moc31, ER (weak) and EMA but negative for cytokeratin 20, TTF-1, GATA-3, CDX-2 and D2-40. Overall, the phenotype is consistent with a gynecologic primary; clinical correlation recommended.   07/07/2018 Procedure   Successful ultrasound guided left thoracentesis yielding 2.0 L of pleural fluid   07/08/2018 Procedure   1. Technically successful placement of left 14 French pigtail chest drain, placed to Pleur-evac water-seal.   07/08/2018 Procedure   1. Technically successful five [place] double lumen power injectable PICC placement   07/09/2018 Imaging   Ct chest 1. There is a moderate, loculated left hydropneumothorax with a small air component and moderate fluid component. The largest loculated component is located posteriorly. There is a pigtail drainage catheter  about the lateral pleural space. There is no obvious etiology, such as obvious mass or pleural disease.   2. There is a small right pleural effusion with associated atelectasis or consolidation and a subpleural consolidation of the superior segment right lower lobe (series 4, image 56), of uncertain significance, possibly infectious or inflammatory   07/10/2018 Imaging   Ct abdomen and pelvis: 1. The bilateral ovaries are enlarged by heterogeneous appearing cystic lesions, measuring 5.3 x 4.2 cm on the right (series 4, image 72) and 4.5 x 3.2 cm on the left (series 4, image 75). Consider dedicated pelvic ultrasound and/or pelvic MRI to further evaluate for solid components given high suspicion for GYN primary malignancy.   2. No other evidence of mass and no lymphadenopathy in the abdomen or pelvis.   3. Trace ascites. There is some suggestion of omental and peritoneal nodularity (e.g. Series 4, image 28), concerning for peritoneal metastatic disease.    4. Loculated left-sided pleural effusion with left-sided pleural drainage catheter in position. Small right pleural effusion   07/13/2018 Surgery   OPERATION: 1.  Left VATS (video-assisted thoracoscopic surgery) for drainage of loculated pleural effusion. 2.  Talc pleurodesis for malignant pleural effusion. 3.  Placement of PleurX catheter for management of malignant pleural effusion. 4.  Placement of On-Q analgesia catheter system.    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   07/13/2018 Pathology Results   Pleura, peel, Left Pleural - FIBRO-FIBRINOUS PLEURITIS - NEGATIVE FOR MALIGNANCY   07/18/2018 Initial Diagnosis   Ovarian cancer, bilateral ([facility])   07/20/2018 Procedure  EGD impression: Normal proximal esophagus and mid esophagus. Mild distal  esophageal rings; dilation not performed because of esophagitis. LA Grade C reflux esophagitis. Z-line regular, 30 cm from the incisors. 5 cm hiatal hernia. Non-bleeding gastric ulcer with no stigmata of bleeding. Gastritis. Duodenal erosions without bleeding. Normal second portion of the duodenum. No specimens collected.   08/19/2018 Genetic Testing   Negative genetic testing on the common hereditary cancer panel.  The Common Hereditary Gene Panel offered by [facility] includes sequencing and/or deletion duplication testing of the following 48 genes: APC, ATM, AXIN2, BARD1, BMPR1A, BRCA1, BRCA2, BRIP1, CDH1, CDK4, CDKN2A (p14ARF), CDKN2A (p16INK4a), CHEK2, CTNNA1, DICER1, EPCAM (Deletion/duplication testing only), GREM1 (promoter region deletion/duplication testing only), KIT, MEN1, MLH1, MSH2, MSH3, MSH6, MUTYH, NBN, NF1, NHTL1, PALB2, PDGFRA, PMS2, POLD1, POLE, PTEN, RAD50, RAD51C, RAD51D, RNF43, SDHB, SDHC, SDHD, SMAD4, SMARCA4. STK11, TP53, TSC1, TSC2, and VHL.  The following genes were evaluated for sequence changes only: SDHA and HOXB13 c.251G>A variant only. The report date is August 19, 2018.   08/24/2018 - 03/15/2019 Chemotherapy   Patient is on Treatment Plan : OVARIAN Carboplatin (AUC 6) / Paclitaxel (175) q21d x 6 cycles     03/05/2021 -  Chemotherapy   Patient is on Treatment Plan : OVARIAN Carboplatin (AUC 6) / Paclitaxel (175) q21d x 6 cycles       CANCER STAGING:  Cancer Staging  No matching staging information was found for the patient.  INTERVAL HISTORY:  Michelle Holt, a 51 y.o. female, seen for follow-up of high-grade serous ovarian carcinoma.  She is taking olaparib 300 mg twice daily which was started on 08/22/2021.  She reports that energy levels have improved after last Feraheme infusion.  She reports nausea about 30 to 45 minutes after taking olaparib.  She is taking Compazine which is helping.  REVIEW OF SYSTEMS:  Review of Systems  Eyes:  Positive for eye problems.   Respiratory:  Positive for shortness of breath.   Gastrointestinal:  Positive for nausea.  All other systems reviewed and are negative.   PAST MEDICAL/SURGICAL HISTORY:  Past Medical History:  Diagnosis Date   Anemia    Anxiety and depression    Arthritis of facet joints at multiple vertebral levels    L5-S1   Constipation    Dyslipidemia    Family history of breast cancer    Family history of uterine cancer    GERD (gastroesophageal reflux disease)    History of hiatal hernia    History of kidney stones    Insomnia    Irritable bowel syndrome    Migraine    Muscle tension headache    Neuropathy of finger    Ovarian carcinoma (HCC)    ovarian   Plantar fasciitis of right foot    Port-A-Cath in place 08/20/2018   Past Surgical History:  Procedure Laterality Date   ABDOMINAL HYSTERECTOMY     BIOPSY  10/27/2020   Procedure: BIOPSY;  Surgeon: Harvel Quale, MD;  [facility];  Service: Gastroenterology;;   CHOLECYSTECTOMY  2008   COLONOSCOPY N/A 08/13/2013   Procedure: COLONOSCOPY;  Surgeon: Rogene Houston, MD;  [facility];  Service: Endoscopy;  Laterality: N/A;  230-moved to 145 Ann to notify pt   COLONOSCOPY WITH PROPOFOL N/A 11/15/2020   Procedure: COLONOSCOPY WITH PROPOFOL;  Surgeon: Rogene Houston, MD;  [facility];  Service: Endoscopy;  Laterality: N/A;  1:40   ESOPHAGEAL DILATION N/A 02/28/2021   Procedure:  ESOPHAGEAL DILATION;  Surgeon: Malissa Hippo, MD;  [facility];  Service: Endoscopy;  Laterality: N/A;   ESOPHAGOGASTRODUODENOSCOPY     ESOPHAGOGASTRODUODENOSCOPY (EGD) WITH PROPOFOL N/A 07/20/2018   Procedure: ESOPHAGOGASTRODUODENOSCOPY (EGD) WITH PROPOFOL;  Surgeon: Malissa Hippo, MD;  [facility];  Service: Endoscopy;  Laterality: N/A;  Possible esophageal dilation.   ESOPHAGOGASTRODUODENOSCOPY (EGD) WITH PROPOFOL N/A 10/27/2020   Procedure: ESOPHAGOGASTRODUODENOSCOPY (EGD) WITH  PROPOFOL;  Surgeon: Dolores Frame, MD;  [facility];  Service: Gastroenterology;  Laterality: N/A;  2:10, pt knows to arrive at 10:15   ESOPHAGOGASTRODUODENOSCOPY (EGD) WITH PROPOFOL N/A 02/28/2021   Procedure: ESOPHAGOGASTRODUODENOSCOPY (EGD) WITH PROPOFOL;  Surgeon: Malissa Hippo, MD;  [facility];  Service: Endoscopy;  Laterality: N/A;  200 ASA 1   GIVENS CAPSULE STUDY N/A 11/24/2020   Procedure: GIVENS CAPSULE STUDY;  Surgeon: Malissa Hippo, MD;  [facility];  Service: Endoscopy;  Laterality: N/A;  7:30   IR ANGIOGRAM SELECTIVE EACH ADDITIONAL VESSEL  08/01/2018   IR ANGIOGRAM VISCERAL SELECTIVE  08/01/2018   IR EMBO ART  VEN HEMORR LYMPH EXTRAV  INC GUIDE ROADMAPPING  08/01/2018   IR IMAGING GUIDED PORT INSERTION  08/20/2018   IR PERC PLEURAL DRAIN W/INDWELL CATH W/IMG GUIDE  07/08/2018   IR THORACENTESIS ASP PLEURAL SPACE W/IMG GUIDE  07/07/2018   IR US GUIDE VASC ACCESS RIGHT  08/01/2018   PLEURAL EFFUSION DRAINAGE Left 07/13/2018   Procedure: DRAINAGE OF LOCULATED PLEURAL EFFUSION;  Surgeon: Kerin Perna, MD;  Location: [facility] OR;  Service: Thoracic;  Laterality: Left;   POLYPECTOMY  11/15/2020   Procedure: POLYPECTOMY;  Surgeon: Malissa Hippo, MD;  [facility];  Service: Endoscopy;;   REMOVAL OF PLEURAL DRAINAGE CATHETER Left 08/20/2018   Procedure: REMOVAL OF PLEURAL DRAINAGE CATHETER;  Surgeon: Kerin Perna, MD;  Location: [facility] OR;  Service: Thoracic;  Laterality: Left;   REMOVAL OF PLEURAL DRAINAGE CATHETER Left 08/20/2018   Procedure: REMOVAL OF PLEURAL DRAINAGE CATHETER;  Surgeon: Kerin Perna, MD;  Location: [facility] OR;  Service: Thoracic;  Laterality: Left;   TALC PLEURODESIS Left 07/13/2018   Procedure: Talc Pleuradesis;  Surgeon: Donata Clay, Theron Arista, MD;  Location: [facility] OR;  Service: Thoracic;  Laterality: Left;   TOTAL HIP ARTHROPLASTY Left 06/05/2020   Procedure: LEFT TOTAL HIP ARTHROPLASTY ANTERIOR APPROACH;  Surgeon: Tarry Kos, MD;  Location: [facility] OR;  Service: Orthopedics;  Laterality: Left;  3-C   TUBAL LIGATION Bilateral    UTERINE ABLATION     VIDEO ASSISTED THORACOSCOPY Left 07/13/2018   Procedure: VIDEO ASSISTED THORACOSCOPY;  Surgeon: Kerin Perna, MD;  Location: [facility] OR;  Service: Thoracic;  Laterality: Left;    SOCIAL HISTORY:  Social History   Socioeconomic History   Marital status: Widowed    Spouse name: Not on file   Number of children: 2   Years of education: 2-College   Highest education level: Not on file  Occupational History    Employer: [employer]  Tobacco Use   Smoking status: Former    Packs/day: 0.50    Years: 17.00    Total pack years: 8.50    Types: Cigarettes    Quit date: 06/22/2018    Years since quitting: 3.3   Smokeless tobacco: Never  Vaping Use   Vaping Use: Never used  Substance and Sexual Activity   Alcohol use: Not Currently    Alcohol/week: 1.0 standard drink of alcohol  Types: 1 Glasses of wine per week   Drug use: No   Sexual activity: Not Currently  Other Topics Concern   Not on file  Social History Narrative   Patient lives at home with her daughter.    Patient has 2 children.    Patient is widowed.    Patient is right handed.    Patient has her Associates degree.      Social Determinants of Health   Financial Resource Strain: Low Risk  (07/22/2018)   Overall Financial Resource Strain (CARDIA)    Difficulty of Paying Living Expenses: Not hard at all  Food Insecurity: No Food Insecurity (07/22/2018)   Hunger Vital Sign    Worried About Running Out of Food in the Last Year: Never true    Ran Out of Food in the Last Year: Never true  Transportation Needs: No Transportation Needs (07/22/2018)   PRAPARE - Hydrologist (Medical): No    Lack of Transportation (Non-Medical): No  Physical Activity: Inactive (07/22/2018)   Exercise Vital Sign    Days of Exercise per Week: 0 days    Minutes of Exercise per Session: 0 min   Stress: Stress Concern Present (07/22/2018)   [place]    Feeling of Stress : Very much  Social Connections: Moderately Isolated (07/22/2018)   Social Connection and Isolation Panel [NHANES]    Frequency of Communication with Friends and Family: More than three times a week    Frequency of Social Gatherings with Friends and Family: More than three times a week    Attends Religious Services: 1 to 4 times per year    Active Member of [employer] or Organizations: No    Attends Archivist Meetings: Never    Marital Status: Widowed  Intimate Partner Violence: Not At Risk (07/22/2018)   Humiliation, Afraid, Rape, and Kick questionnaire    Fear of Current or Ex-Partner: No    Emotionally Abused: No    Physically Abused: No    Sexually Abused: No    FAMILY HISTORY:  Family History  Problem Relation Age of Onset   Depression Mother    Hypertension Mother    Obesity Mother    Diabetes Mother    Kidney disease Mother    Peripheral vascular disease Father    Atrial fibrillation Father    Crohn's disease Sister    Uterine cancer Sister 68       maternal half sister   COPD Brother    Osteoporosis Brother    Breast cancer Maternal Aunt 58   Colon cancer Neg Hx     CURRENT MEDICATIONS:  Current Outpatient Medications  Medication Sig Dispense Refill   albuterol (VENTOLIN HFA) 108 (90 Base) MCG/ACT inhaler Inhale 2 puffs into the lungs every 6 (six) hours as needed for wheezing or shortness of breath.     B Complex-C (B-COMPLEX WITH VITAMIN C) tablet Take 1 tablet by mouth daily.     Cholecalciferol (VITAMIN D) 50 MCG (2000 UT) CAPS Take 2,000 Units by mouth daily.     dexamethasone (DECADRON) 4 MG tablet Take 1 tablet (4 mg total) by mouth daily. Three days after each treatment 30 tablet 2   diazepam (VALIUM) 5 MG tablet TAKE ONE TABLET BY MOUTH AT BEDTIME (Patient taking differently: Take 5 mg by mouth at bedtime.) 30  tablet 5   dicyclomine (BENTYL) 10 MG capsule Take 1 capsule (10 mg total) by  mouth 3 (three) times daily as needed for spasms. 90 capsule 3   folic acid (FOLVITE) 1 MG tablet Take 1 tablet (1 mg total) by mouth daily. 30 tablet 1   guaiFENesin (MUCINEX) 600 MG 12 hr tablet Take 1 tablet (600 mg total) by mouth 2 (two) times daily. 10 tablet 0   lactulose (CHRONULAC) 10 GM/15ML solution TAKE ONE TABLESPOONFUL (15ML) EVERY 3 HOURS UNTIL BOWEL MOVEMENT ACHIEVED THEN TAKEN DAILY AS NEEDED 480 mL 6   lidocaine-prilocaine (EMLA) cream Apply 1 application  topically as needed (pain).     linaclotide (LINZESS) 290 MCG CAPS capsule Take 1 capsule (290 mcg total) by mouth daily before breakfast. 30 capsule 6   LORazepam (ATIVAN) 0.5 MG tablet Take 0.5 mg by mouth 3 (three) times daily as needed.     magic mouthwash w/lidocaine SOLN Take 5 mLs by mouth 3 (three) times daily as needed for mouth pain (Swish and spit). 480 mL 6   magnesium oxide (MAG-OX) 400 (240 Mg) MG tablet Take 1 tablet (400 mg total) by mouth 2 (two) times daily. 60 tablet 6   mirabegron ER (MYRBETRIQ) 25 MG TB24 tablet Take 1 tablet (25 mg total) by mouth daily. 28 tablet 0   olaparib (LYNPARZA) 150 MG tablet Take 2 tablets (300 mg total) by mouth 2 (two) times daily. Swallow whole. May take with food to decrease nausea and vomiting. 120 tablet 2   omeprazole (PRILOSEC) 40 MG capsule TAKE ONE CAPSULE BY MOUTH TWICE DAILY BEFORE MEALS 60 capsule 5   oxyCODONE (OXY IR/ROXICODONE) 5 MG immediate release tablet Take 1 tablet (5 mg total) by mouth every 4 (four) hours as needed for severe pain. Take 15 mg oxycodone + 5 mg oxycodone together for total dose 20 mg. 180 tablet 0   oxyCODONE (ROXICODONE) 15 MG immediate release tablet Take 1 tablet (15 mg total) by mouth every 4 (four) hours as needed for pain. Take 15 mg oxycodone + 5 mg oxycodone together for total dose 20 mg. 180 tablet 0   predniSONE (DELTASONE) 10 MG tablet Prednisone 40 mg po  daily x 1 day then Prednisone 30 mg po daily x 1 day then Prednisone 20 mg po daily x 1 day then Prednisone 10 mg daily x 1 day then stop... 10 tablet 0   prochlorperazine (COMPAZINE) 10 MG tablet Take 1 tablet (10 mg total) by mouth 2 (two) times daily as needed for nausea or vomiting. Take 30 minutes before Lynparza twice daily. 60 tablet 3   promethazine (PHENERGAN) 25 MG tablet Take 1 tablet (25 mg total) by mouth every 6 (six) hours as needed for nausea or vomiting. 60 tablet 4   sennosides-docusate sodium (SENOKOT-S) 8.6-50 MG tablet Take 1 tablet by mouth 2 (two) times daily.     SUMAtriptan (IMITREX) 100 MG tablet TAKE ONE TABLET BY MOUTH PRN UP to TWICE DAILY AS NEEDED. (Patient taking differently: Take 100 mg by mouth 2 (two) times daily as needed for migraine.) 9 tablet 5   tiZANidine (ZANAFLEX) 4 MG tablet Take 1 tablet (4 mg total) by mouth 3 (three) times daily as needed for muscle spasms. 60 tablet 0   triamcinolone cream (KENALOG) 0.1 % Apply 1 application topically 2 (two) times daily as needed (rash). 80 g 11   valACYclovir (VALTREX) 1000 MG tablet Take 1,000 mg by mouth 3 (three) times daily.     venlafaxine (EFFEXOR) 75 MG tablet Take 1 tablet (75 mg total) by mouth daily. 30 tablet  3   No current facility-administered medications for this visit.   Facility-Administered Medications Ordered in Other Visits  Medication Dose Route Frequency Provider Last Rate Last Admin   0.9 %  sodium chloride infusion   Intravenous Continuous Doreatha Massed, MD   Stopped at 10/23/20 1547   sodium chloride flush (NS) 0.9 % injection 10 mL  10 mL Intravenous PRN Doreatha Massed, MD   10 mL at 10/23/20 1544    ALLERGIES:  Allergies  Allergen Reactions   Morphine And Related Itching   Nickel Itching   Nortriptyline Other (See Comments)    Significant weight gain   Topamax [Topiramate] Diarrhea and Nausea Only   Xanax [Alprazolam]     Can't wake up    Actifed Cold-Allergy  [Chlorpheniramine-Phenyleph Er] Rash    Red dye only   Amoxicillin Rash    Did it involve swelling of the face/tongue/throat, SOB, or low BP? Unknown Did it involve sudden or severe rash/hives, skin peeling, or any reaction on the inside of your mouth or nose? Unknown Did you need to seek medical attention at a hospital or doctor's office? Unknown When did it last happen? teenager       If all above answers are "NO", may proceed with cephalosporin use.    Codeine Hives   Erythromycin Rash   Penicillins Rash    Did it involve swelling of the face/tongue/throat, SOB, or low BP? Unknown Did it involve sudden or severe rash/hives, skin peeling, or any reaction on the inside of your mouth or nose? Unknown Did you need to seek medical attention at a hospital or doctor's office? Unknown When did it last happen? teenager       If all above answers are "NO", may proceed with cephalosporin use.    Sudafed [Pseudoephedrine Hcl] Rash    Red dye only    PHYSICAL EXAM:  Performance status (ECOG): 1 - Symptomatic but completely ambulatory  There were no vitals filed for this visit.  Wt Readings from Last 3 Encounters:  10/09/21 188 lb 11.2 oz (85.6 kg)  09/12/21 190 lb 9.6 oz (86.5 kg)  08/15/21 184 lb 8 oz (83.7 kg)   Physical Exam Vitals reviewed.  Constitutional:      Appearance: Normal appearance. She is obese.  Cardiovascular:     Rate and Rhythm: Normal rate and regular rhythm.     Pulses: Normal pulses.     Heart sounds: Normal heart sounds.  Pulmonary:     Effort: Pulmonary effort is normal.     Breath sounds: Normal breath sounds.  Neurological:     General: No focal deficit present.     Mental Status: She is alert and oriented to person, place, and time.  Psychiatric:        Mood and Affect: Mood normal.        Behavior: Behavior normal.     LABORATORY DATA:  I have reviewed the labs as listed.     Latest Ref Rng & Units 10/16/2021    1:12 PM 10/09/2021   10:14 AM  09/03/2021    2:33 PM  CBC  WBC 4.0 - 10.5 K/uL 5.2  5.1  5.6   Hemoglobin 12.0 - 15.0 g/dL 71.4  83.9  12.6   Hematocrit 36.0 - 46.0 % 32.4  37.6  33.1   Platelets 150 - 400 K/uL 239  217  272       Latest Ref Rng & Units 10/16/2021    1:12 PM 10/09/2021  10:14 AM 09/03/2021    2:33 PM  CMP  Glucose 70 - 99 mg/dL 138  113  107   BUN 6 - 20 mg/dL $Remove'11  11  10   'LlgFmyo$ Creatinine 0.44 - 1.00 mg/dL 0.78  0.79  0.60   Sodium 135 - 145 mmol/L 137  139  139   Potassium 3.5 - 5.1 mmol/L 3.7  4.3  3.7   Chloride 98 - 111 mmol/L 103  103  104   CO2 22 - 32 mmol/L $RemoveB'25  28  27   'oYboqApN$ Calcium 8.9 - 10.3 mg/dL 8.9  9.6  8.9   Total Protein 6.5 - 8.1 g/dL 6.7  7.2  7.0   Total Bilirubin 0.3 - 1.2 mg/dL 0.7  0.6  0.3   Alkaline Phos 38 - 126 U/L 126  115  80   AST 15 - 41 U/L 24  63  18   ALT 0 - 44 U/L 40  81  15     DIAGNOSTIC IMAGING:  I have independently reviewed the scans and discussed with the patient. No results found.   ASSESSMENT:  1.  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 -Robotic assisted laparoscopic TAH and BSO and omentectomy on 12/24/2018, pathology showing high-grade serous carcinoma, PT3P NX. -Germline mutation testing was negative. -3 more cycles of adjuvant chemotherapy completed on 03/15/2019. -CTAP on 04/13/2019 showed no findings of active malignancy.  28% reduction in the volume of presumed chronic hematoma/chronic fluid collection splaying the upper margin of the spleen.  Large type III hiatal hernia. -CTAP on 10/13/2019 shows no findings of recurrence or metastatic disease. - Foundation 1 shows HRD+, LOH score>16%.  MSI-stable.  MYC amplification.  T p53 mutation. - We reviewed CT CAP from 02/21/2021 which showed progressive peritoneal metastasis compared to 12/06/2020 scan.  No bowel obstruction.  Small right pleural effusion with mild enlargement of an  isolated left external iliac lymph node. - Reviewed EGD from 02/28/2021 which showed hiatal hernia and normal findings. - She reported epigastric and left upper quadrant pain worse in the last 1 week which is related to progression of her malignancy. - 6 cycles of carboplatin and paclitaxel from 03/13/2021 through 06/18/2021 - CT CAP (07/12/2021): No evidence of recurrence or metastatic disease. - Olaparib 300 mg twice daily started on 08/22/2021.    PLAN:  1.  Clinical stage IVa high-grade serous ovarian cancer, positive cytology of left pleural effusion, HRD positive: - CT CAP (07/12/2021): No evidence of recurrence or metastatic disease in the chest, abdomen or pelvis.  Previously seen peritoneal, omental, nodal metastatic disease is resolved. - She reports nausea 30 to 40 minutes after taking olaparib. - Continue Compazine 30 minutes prior to taking olaparib twice daily. - She was reportedly taking some weight loss sublingual medication which caused her nausea to worsen.  She is no longer taking it. - Reviewed her labs today which shows normal LFTs and CBC.  Anemia secondary to olaparib.  CA125 is pending from today.  Last 1 was 9.9. - Continue olaparib 300 mg twice daily.  RTC 6 weeks for follow-up with repeat tumor marker and labs.   2.  Lower back/left upper quadrant pain: - Continue oxycodone 20 mg every 4-6 hours as needed.  This has been stable.   3.  Macrocytic anemia: - Continue folic acid daily.  4.  Iron deficiency state: - Received Feraheme on 09/20/2021 and  09/27/2021.  Hemoglobin is 11, due to myelosuppression from olaparib.  We will plan to check ferritin and iron panel at next visit.  5.  Constipation: - Continue Linzess as needed.  6.  Hypomagnesemia: - Continue magnesium twice daily.  Magnesium is 1.7 today.   Orders placed this encounter:  No orders of the defined types were placed in this encounter.     Derek Jack, MD [place] [phone]

## 2021-10-18 DIAGNOSIS — Z299 Encounter for prophylactic measures, unspecified: Secondary | ICD-10-CM | POA: Diagnosis not present

## 2021-10-18 DIAGNOSIS — Z2821 Immunization not carried out because of patient refusal: Secondary | ICD-10-CM | POA: Diagnosis not present

## 2021-10-18 DIAGNOSIS — H1033 Unspecified acute conjunctivitis, bilateral: Secondary | ICD-10-CM | POA: Diagnosis not present

## 2021-10-18 DIAGNOSIS — H5713 Ocular pain, bilateral: Secondary | ICD-10-CM | POA: Diagnosis not present

## 2021-10-18 DIAGNOSIS — Z789 Other specified health status: Secondary | ICD-10-CM | POA: Diagnosis not present

## 2021-10-18 LAB — CA 125: Cancer Antigen (CA) 125: 6 U/mL (ref 0.0–38.1)

## 2021-10-29 ENCOUNTER — Other Ambulatory Visit (HOSPITAL_COMMUNITY): Payer: Self-pay | Admitting: Hematology

## 2021-10-29 ENCOUNTER — Ambulatory Visit (INDEPENDENT_AMBULATORY_CARE_PROVIDER_SITE_OTHER): Payer: Medicare Other | Admitting: Psychiatry

## 2021-10-29 ENCOUNTER — Encounter (HOSPITAL_COMMUNITY): Payer: Self-pay

## 2021-10-29 DIAGNOSIS — F321 Major depressive disorder, single episode, moderate: Secondary | ICD-10-CM | POA: Diagnosis not present

## 2021-10-29 NOTE — Progress Notes (Signed)
IN- PERSON  THERAPIST PROGRESS NOTE  Session Time: Monday 10/29/2021 11:10 AM  - 12:00 PM  Participation Level: Active  Behavioral Response: CasualAlertDepressed  Type of Therapy: Individual Therapy  Treatment Goals addressed: Reduce frequency, intensity, and duration of depression symptoms AEB by pt participating in pleasant activities/socialization 3-4 x per week for 60 days. Michelle WILL SCORE LESS THAN 10 ON THE PATIENT HEALTH QUESTIONNAIRE (PHQ-9) Holt WILL PRACTICE BEHAVIORAL ACTIVATION SKILLS  1-2 TIMES PER WEEK FOR THE NEXT 12 WEEKS   ProgressTowards Goals: Initial  Interventions: CBT and Supportive  Summary: Michelle Holt is a 51 y.o. female who is self -referred for services due to pt experiencing symptoms of depression. She was hospitlaized at [facility] at age 54 due to depression. Pt participated in outpatient therapy for about a year after husband died in 03/31/2016.  She states feeling as though she is just here and has no purpose.  She states getting up and doing the same thing every day.  Patient reports needing to talk to someone as her brain feels full and she cannot get things off her mind. Per patient's report this began about 3 months ago.  Her stressors include 20 year old son who resides with patient having a TBI, patient having ovarian cancer( currently in remission) husband dying in 03/31/16 due to lung cancer, mother dying in 31-Mar-2012, and father dying in March 31, 2017.  Patient also reports stress related to 67 year old daughter having behavioral health issues.  Patient last was seen about 4 weeks ago for the assessment appointment.  She continues to experience symptoms of depression as reflected in the PHQ 2 and 9.  She reports feeling a little better for about a week as she helped provide temporary care to 5 of her neighbor's children. patient states she felt purposeful while doing this.  She also reported starting to do small tasks around her home and says this was helpful.   She reports she did recently attend a wedding but did not really want to go.  She reports continued stress regarding her daughter as patient reports tension in the relationship.  She also reports unresolved grief and loss issues related to the loss of multiple family members. Patient reports never really having time to grieve the losses as she had to be strong for self and others.  She is pleased she is in remission regarding cancer but knows that it could come back at any time.  She accepts this but worries about her son should something happen to her.    Suicidal/Homicidal: Nowithout intent/plan  Therapist Response: Reviewed symptoms, administered PHQ 2 and 9 with C-S SRS, discussed results, discussed stressors, facilitated expression of thoughts and feelings, validated feelings, developed treatment plan, obtained patient's permission to electronically sign plan for patient, provided patient with copy of plan, discussed next steps for treatment, praised and reinforced patient's efforts to become more involved in activity  Plan: Return again in 2 weeks.  Diagnosis: Major depressive disorder, single episode, moderate ([place])  Collaboration of Care: Primary Care Provider AEB patient is working with PCP Dr. Woody Seller  Patient/Guardian was advised Release of Information must be obtained prior to any record release in order to collaborate their care with an outside provider. Patient/Guardian was advised if they have not already done so to contact the registration department to sign all necessary forms in order for [place] to release information regarding their care.   Consent: Patient/Guardian gives verbal consent for treatment and assignment of benefits for services provided during this  visit. Patient/Guardian expressed understanding and agreed to proceed.   Alonza Smoker, LCSW 10/29/2021

## 2021-10-29 NOTE — Plan of Care (Signed)
  Problem: Depression CCP depressed mood, poor motivation, grief and loss Goal: "I want to get all out, be able to talk about what is bringing me down" Outcome: Initial Goal: LTG: Reduce frequency, intensity, and duration of depression symptoms AEB by pt participating in pleasant activities/socialization 3-4 x per week for 60 days.  Outcome: Initial Goal: LTG: Michelle Holt WILL SCORE LESS THAN 10 ON THE PATIENT HEALTH QUESTIONNAIRE (PHQ-9) Outcome: Initial Goal: STG: Michelle Holt WILL PRACTICE BEHAVIORAL ACTIVATION SKILLS  1-2 TIMES PER WEEK FOR THE NEXT 12 WEEKS Outcome: Initial

## 2021-11-01 ENCOUNTER — Other Ambulatory Visit: Payer: Self-pay | Admitting: *Deleted

## 2021-11-01 ENCOUNTER — Other Ambulatory Visit (HOSPITAL_COMMUNITY): Payer: Self-pay

## 2021-11-01 ENCOUNTER — Encounter: Payer: Self-pay | Admitting: *Deleted

## 2021-11-01 MED ORDER — MAALOX MAX 400-400-40 MG/5ML PO SUSP: ORAL | 1 refills | Status: DC

## 2021-11-01 MED ORDER — FLUCONAZOLE 100 MG PO TABS: ORAL_TABLET | ORAL | 0 refills | Status: DC

## 2021-11-01 NOTE — Progress Notes (Signed)
Patient came by clinic and reports thrush.  She has tenderness to her tongue and roof of her mouth.  Patient does have white film over top of her tongue.  Orders received for magic mouth wash and diflucan.  Those are sent to her pharmacy.  Patient aware.

## 2021-11-06 ENCOUNTER — Other Ambulatory Visit (HOSPITAL_COMMUNITY): Payer: Self-pay

## 2021-11-06 ENCOUNTER — Other Ambulatory Visit: Payer: Self-pay | Admitting: *Deleted

## 2021-11-06 ENCOUNTER — Other Ambulatory Visit: Payer: Self-pay | Admitting: Hematology

## 2021-11-06 MED ORDER — OLAPARIB 150 MG PO TABS
300.0000 mg | ORAL_TABLET | Freq: Two times a day (BID) | ORAL | 2 refills | Status: DC
  Filled 2021-11-06: qty 120, 30d supply, fill #0
  Filled 2021-12-11: qty 120, 30d supply, fill #1
  Filled 2022-01-01: qty 120, 30d supply, fill #2

## 2021-11-06 NOTE — Telephone Encounter (Signed)
Refill for Lynparza approved.  Patient tolerating and is to continue therapy.

## 2021-11-07 ENCOUNTER — Other Ambulatory Visit (HOSPITAL_COMMUNITY): Payer: Self-pay

## 2021-11-07 MED ORDER — OXYCODONE HCL 20 MG PO TABS: 1.0000 | ORAL_TABLET | ORAL | 0 refills | Status: DC | PRN

## 2021-11-08 ENCOUNTER — Other Ambulatory Visit (HOSPITAL_COMMUNITY): Payer: Self-pay

## 2021-11-08 DIAGNOSIS — J45909 Unspecified asthma, uncomplicated: Secondary | ICD-10-CM | POA: Diagnosis not present

## 2021-11-08 DIAGNOSIS — R062 Wheezing: Secondary | ICD-10-CM | POA: Diagnosis not present

## 2021-11-08 DIAGNOSIS — Z299 Encounter for prophylactic measures, unspecified: Secondary | ICD-10-CM | POA: Diagnosis not present

## 2021-11-09 ENCOUNTER — Other Ambulatory Visit (HOSPITAL_COMMUNITY): Payer: Self-pay

## 2021-11-12 ENCOUNTER — Emergency Department (HOSPITAL_COMMUNITY): Payer: Medicare Other

## 2021-11-12 ENCOUNTER — Encounter (HOSPITAL_COMMUNITY): Payer: Self-pay

## 2021-11-12 ENCOUNTER — Other Ambulatory Visit: Payer: Self-pay

## 2021-11-12 ENCOUNTER — Emergency Department (HOSPITAL_COMMUNITY)
Admission: EM | Admit: 2021-11-12 | Discharge: 2021-11-12 | Disposition: A | Discharge status: Home / Self Care | Payer: Medicare Other | Attending: Emergency Medicine | Admitting: Emergency Medicine

## 2021-11-12 ENCOUNTER — Ambulatory Visit (HOSPITAL_COMMUNITY): Payer: Medicare Other | Admitting: Psychiatry

## 2021-11-12 DIAGNOSIS — R109 Unspecified abdominal pain: Secondary | ICD-10-CM | POA: Insufficient documentation

## 2021-11-12 DIAGNOSIS — Z8543 Personal history of malignant neoplasm of ovary: Secondary | ICD-10-CM | POA: Diagnosis not present

## 2021-11-12 DIAGNOSIS — R059 Cough, unspecified: Secondary | ICD-10-CM | POA: Insufficient documentation

## 2021-11-12 DIAGNOSIS — R079 Chest pain, unspecified: Secondary | ICD-10-CM | POA: Insufficient documentation

## 2021-11-12 DIAGNOSIS — R0602 Shortness of breath: Secondary | ICD-10-CM | POA: Diagnosis not present

## 2021-11-12 DIAGNOSIS — R531 Weakness: Secondary | ICD-10-CM | POA: Diagnosis not present

## 2021-11-12 DIAGNOSIS — M546 Pain in thoracic spine: Secondary | ICD-10-CM | POA: Insufficient documentation

## 2021-11-12 DIAGNOSIS — R5383 Other fatigue: Secondary | ICD-10-CM | POA: Insufficient documentation

## 2021-11-12 DIAGNOSIS — Z87891 Personal history of nicotine dependence: Secondary | ICD-10-CM | POA: Insufficient documentation

## 2021-11-12 DIAGNOSIS — K449 Diaphragmatic hernia without obstruction or gangrene: Secondary | ICD-10-CM | POA: Diagnosis not present

## 2021-11-12 DIAGNOSIS — M25512 Pain in left shoulder: Secondary | ICD-10-CM | POA: Diagnosis not present

## 2021-11-12 LAB — CBC WITH DIFFERENTIAL/PLATELET
Abs Immature Granulocytes: 0.17 10*3/uL — ABNORMAL HIGH (ref 0.00–0.07)
Basophils Absolute: 0 10*3/uL (ref 0.0–0.1)
Basophils Relative: 0 %
Eosinophils Absolute: 0 10*3/uL (ref 0.0–0.5)
Eosinophils Relative: 0 %
HCT: 32.9 % — ABNORMAL LOW (ref 36.0–46.0)
Hemoglobin: 11.3 g/dL — ABNORMAL LOW (ref 12.0–15.0)
Immature Granulocytes: 2 %
Lymphocytes Relative: 19 %
Lymphs Abs: 1.4 10*3/uL (ref 0.7–4.0)
MCH: 40.2 pg — ABNORMAL HIGH (ref 26.0–34.0)
MCHC: 34.3 g/dL (ref 30.0–36.0)
MCV: 117.1 fL — ABNORMAL HIGH (ref 80.0–100.0)
Monocytes Absolute: 0.4 10*3/uL (ref 0.1–1.0)
Monocytes Relative: 5 %
Neutro Abs: 5.2 10*3/uL (ref 1.7–7.7)
Neutrophils Relative %: 74 %
Platelets: 244 10*3/uL (ref 150–400)
RBC: 2.81 MIL/uL — ABNORMAL LOW (ref 3.87–5.11)
RDW: 17.5 % — ABNORMAL HIGH (ref 11.5–15.5)
WBC: 7.2 10*3/uL (ref 4.0–10.5)
nRBC: 1 % — ABNORMAL HIGH (ref 0.0–0.2)

## 2021-11-12 LAB — BLOOD GAS, VENOUS
Acid-Base Excess: 5.9 mmol/L — ABNORMAL HIGH (ref 0.0–2.0)
Bicarbonate: 31.7 mmol/L — ABNORMAL HIGH (ref 20.0–28.0)
Drawn by: 46492
O2 Saturation: 68.2 %
Patient temperature: 36.8
pCO2, Ven: 50 mmHg (ref 44–60)
pH, Ven: 7.41 (ref 7.25–7.43)
pO2, Ven: 39 mmHg (ref 32–45)

## 2021-11-12 LAB — COMPREHENSIVE METABOLIC PANEL
ALT: 50 U/L — ABNORMAL HIGH (ref 0–44)
AST: 34 U/L (ref 15–41)
Albumin: 3.6 g/dL (ref 3.5–5.0)
Alkaline Phosphatase: 105 U/L (ref 38–126)
Anion gap: 11 (ref 5–15)
BUN: 17 mg/dL (ref 6–20)
CO2: 22 mmol/L (ref 22–32)
Calcium: 9.3 mg/dL (ref 8.9–10.3)
Chloride: 103 mmol/L (ref 98–111)
Creatinine, Ser: 0.92 mg/dL (ref 0.44–1.00)
GFR, Estimated: >60 mL/min (ref >60)
Glucose, Bld: 168 mg/dL — ABNORMAL HIGH (ref 70–99)
Potassium: 5 mmol/L (ref 3.5–5.1)
Sodium: 136 mmol/L (ref 135–145)
Total Bilirubin: 1 mg/dL (ref 0.3–1.2)
Total Protein: 7 g/dL (ref 6.5–8.1)

## 2021-11-12 LAB — BRAIN NATRIURETIC PEPTIDE: B Natriuretic Peptide: 93 pg/mL (ref 0.0–100.0)

## 2021-11-12 LAB — TROPONIN I (HIGH SENSITIVITY): Troponin I (High Sensitivity): 3 ng/L (ref <18)

## 2021-11-12 LAB — MAGNESIUM: Magnesium: 1.8 mg/dL (ref 1.7–2.4)

## 2021-11-12 MED ORDER — OXYCODONE HCL 5 MG PO TABS
10.0000 mg | ORAL_TABLET | Freq: Once | ORAL | Status: AC
  Administered 2021-11-12: 10 mg via ORAL
  Filled 2021-11-12: qty 2

## 2021-11-12 MED ORDER — LACTATED RINGERS IV SOLN: INTRAVENOUS | Status: DC

## 2021-11-12 MED ORDER — IPRATROPIUM-ALBUTEROL 0.5-2.5 (3) MG/3ML IN SOLN
3.0000 mL | Freq: Once | RESPIRATORY_TRACT | Status: AC
  Administered 2021-11-12: 3 mL via RESPIRATORY_TRACT
  Filled 2021-11-12: qty 3

## 2021-11-12 MED ORDER — LORAZEPAM 0.5 MG PO TABS
0.5000 mg | ORAL_TABLET | Freq: Once | ORAL | Status: AC
  Administered 2021-11-12: 0.5 mg via ORAL
  Filled 2021-11-12: qty 1

## 2021-11-12 MED ORDER — IOHEXOL 350 MG/ML SOLN
100.0000 mL | Freq: Once | INTRAVENOUS | Status: AC | PRN
  Administered 2021-11-12: 75 mL via INTRAVENOUS

## 2021-11-12 NOTE — Telephone Encounter (Signed)
Please advise 

## 2021-11-12 NOTE — Discharge Instructions (Addendum)
Discuss the risks and benefits of your Lynparza with your oncologist.  Continue albuterol treatment at home as needed.  Continue your current steroid therapy.  Return to the emergency department at any time for any new or worsening symptoms of concern.

## 2021-11-12 NOTE — ED Triage Notes (Signed)
Pt presents to ED, states she was diagnosed with pneumonia last week. Pt states she has had increased SOB since.

## 2021-11-12 NOTE — ED Provider Notes (Signed)
[facility] EMERGENCY DEPARTMENT Provider Note   CSN: [id number] Arrival date & time: 11/12/21  1309     History  Chief Complaint  Patient presents with   Shortness of Breath    Michelle Holt is a 51 y.o. female.   Shortness of Breath Associated symptoms: abdominal pain, chest pain and cough   Patient presents for shortness of breath.  Medical history includes migraine headaches, anxiety, tobacco use, GI bleed, anemia, GERD, ovarian cancer.  Ovarian cancer was diagnosed 3 years ago.  Current cancer therapy is a olaparib.  She was recently diagnosed with pneumonia and started on Levaquin and prednisone.  She was prescribed 500 mg Levaquin daily.  She has been doubling this dose.  She was given a prescription for 4 days of 30 mg prednisone daily.  She has 1 more day of these therapies left.  Despite antibiotics and steroids, she has not had any improvement in symptoms.  She continues to have cough and severe shortness of breath.  Shortness of breath is worsened with exertion.  She has severe fatigue.  She denies any fevers but has had intermittent chills.  Patient has a smoking history but quit smoking at time of her cancer diagnosis 3 years ago.  She has utilized inhalers at home and feels like they have improved her symptoms mildly.  She endorses pain in areas of left shoulder and bilateral thoracic back.  She has also had worsened pain in her epigastrium that she attributes to coughing and her known hiatal hernia.  At baseline, she takes 20 mg oxycodone for management of chronic pain.     Home Medications Prior to Admission medications   Medication Sig Start Date End Date Taking? Authorizing Provider  albuterol (VENTOLIN HFA) 108 (90 Base) MCG/ACT inhaler Inhale 2 puffs into the lungs every 6 (six) hours as needed for wheezing or shortness of breath.   Yes [provider]  alum & mag hydroxide-simeth (MAALOX MAX) 400-400-40 MG/5ML suspension Swish and swallow 5 ml four times  daily. 11/01/21  Yes Dede Query T, PA-C  B Complex-C (B-COMPLEX WITH VITAMIN C) tablet Take 1 tablet by mouth daily.   Yes [provider]  Cholecalciferol (VITAMIN D) 50 MCG (2000 UT) CAPS Take 2,000 Units by mouth daily.   Yes [provider]  diazepam (VALIUM) 5 MG tablet TAKE ONE TABLET BY MOUTH AT BEDTIME 10/29/21  Yes Derek Jack, MD  dicyclomine (BENTYL) 10 MG capsule Take 1 capsule (10 mg total) by mouth 3 (three) times daily as needed for spasms. 03/05/21  Yes Derek Jack, MD  folic acid (FOLVITE) 1 MG tablet Take 1 tablet (1 mg total) by mouth daily. 06/27/21  Yes Oswald Hillock, MD  guaiFENesin (MUCINEX) 600 MG 12 hr tablet Take 1 tablet (600 mg total) by mouth 2 (two) times daily. 06/26/21  Yes Oswald Hillock, MD  levofloxacin (LEVAQUIN) 500 MG tablet Take 500 mg by mouth daily. Take twice daily per pt 11/08/21  Yes [provider]  lidocaine-prilocaine (EMLA) cream Apply 1 application  topically as needed (pain).   Yes [provider]  LORazepam (ATIVAN) 0.5 MG tablet Take 0.5 mg by mouth 3 (three) times daily as needed. 07/04/21  Yes [provider]  magnesium oxide (MAG-OX) 400 (240 Mg) MG tablet Take 1 tablet (400 mg total) by mouth 2 (two) times daily. 10/09/21  Yes Pennington, Rebekah M, PA-C  olaparib (LYNPARZA) 150 MG tablet Take 2 tablets (300 mg total) by mouth 2 (  two) times daily. Swallow whole. May take with food to decrease nausea and vomiting. 11/06/21  Yes Derek Jack, MD  omeprazole (PRILOSEC) 40 MG capsule TAKE ONE CAPSULE BY MOUTH TWICE DAILY BEFORE MEALS 10/09/21  Yes Harvel Quale, MD  Oxycodone HCl 20 MG TABS Take 1 tablet (20 mg total) by mouth every 4 (four) hours as needed. 11/07/21  Yes Derek Jack, MD  predniSONE (DELTASONE) 10 MG tablet Prednisone 40 mg po daily x 1 day then Prednisone 30 mg po daily x 1 day then Prednisone 20 mg po daily x 1 day then Prednisone 10 mg daily x 1  day then stop... 06/26/21  Yes Oswald Hillock, MD  prochlorperazine (COMPAZINE) 10 MG tablet Take 1 tablet (10 mg total) by mouth 2 (two) times daily as needed for nausea or vomiting. Take 30 minutes before Lynparza twice daily. 10/09/21  Yes Pennington, Rebekah M, PA-C  promethazine (PHENERGAN) 25 MG tablet Take 1 tablet (25 mg total) by mouth every 6 (six) hours as needed for nausea or vomiting. 10/09/21  Yes Pennington, Rebekah M, PA-C  sennosides-docusate sodium (SENOKOT-S) 8.6-50 MG tablet Take 1 tablet by mouth 2 (two) times daily.   Yes [provider]  SUMAtriptan (IMITREX) 100 MG tablet TAKE ONE TABLET BY MOUTH PRN UP to TWICE DAILY AS NEEDED. Patient taking differently: Take 100 mg by mouth 2 (two) times daily as needed for migraine. 08/12/18  Yes Suzzanne Cloud, NP  triamcinolone cream (KENALOG) 0.1 % Apply 1 application topically 2 (two) times daily as needed (rash). 06/28/20  Yes Derek Jack, MD  venlafaxine (EFFEXOR) 75 MG tablet Take 1 tablet (75 mg total) by mouth daily. 10/09/21  Yes Pennington, Rebekah M, PA-C  dexamethasone (DECADRON) 4 MG tablet Take 1 tablet (4 mg total) by mouth daily. Three days after each treatment Patient not taking: Reported on 11/12/2021 03/05/21   Derek Jack, MD  fluconazole (DIFLUCAN) 100 MG tablet Take 1 tablet by mouth daily for 7 days for thrush Patient not taking: Reported on 11/12/2021 11/01/21   Dede Query T, PA-C  lactulose (CHRONULAC) 10 GM/15ML solution TAKE ONE TABLESPOONFUL (15ML) EVERY 3 HOURS UNTIL BOWEL MOVEMENT ACHIEVED THEN TAKEN DAILY AS NEEDED Patient not taking: Reported on 11/12/2021 06/26/21   Derek Jack, MD  linaclotide Rolan Lipa) 290 MCG CAPS capsule Take 1 capsule (290 mcg total) by mouth daily before breakfast. Patient not taking: Reported on 11/12/2021 05/09/21   Derek Jack, MD  magic mouthwash w/lidocaine SOLN Take 5 mLs by mouth 3 (three) times daily as needed for mouth pain (Swish and  spit). Patient not taking: Reported on 11/12/2021 05/30/21   Derek Jack, MD  mirabegron ER (MYRBETRIQ) 25 MG TB24 tablet Take 1 tablet (25 mg total) by mouth daily. Patient not taking: Reported on 11/12/2021 08/02/21   Primus Bravo., MD  valACYclovir (VALTREX) 1000 MG tablet Take 1,000 mg by mouth 3 (three) times daily. Patient not taking: Reported on 11/12/2021 09/21/21   [provider]      Allergies    Morphine and related, Nickel, Nortriptyline, Topamax [topiramate], Xanax [alprazolam], Actifed cold-allergy [chlorpheniramine-phenyleph er], Amoxicillin, Codeine, Erythromycin, Penicillins, and Sudafed [pseudoephedrine hcl]    Review of Systems   Review of Systems  Constitutional:  Positive for activity change, appetite change, chills and fatigue.  Respiratory:  Positive for cough, chest tightness and shortness of breath.   Cardiovascular:  Positive for chest pain.  Gastrointestinal:  Positive for abdominal pain.  Musculoskeletal:  Positive for back pain.  Neurological:  Positive for weakness (Generalized).    Physical Exam Updated Vital Signs BP 113/60   Pulse 76   Temp 98.1 F (36.7 C) (Oral)   Resp 15   Ht '5\' 4"'$  (1.626 m)   Wt 83.9 kg   LMP  (LMP Unknown)   SpO2 90%   BMI 31.76 kg/m  Physical Exam Vitals and nursing note reviewed.  Constitutional:      General: She is not in acute distress.    Appearance: She is well-developed. She is ill-appearing. She is not toxic-appearing or diaphoretic.  HENT:     Head: Normocephalic and atraumatic.     Mouth/Throat:     Mouth: Mucous membranes are moist.     Pharynx: Oropharynx is clear.  Eyes:     Conjunctiva/sclera: Conjunctivae normal.  Cardiovascular:     Rate and Rhythm: Normal rate and regular rhythm.     Heart sounds: No murmur heard. Pulmonary:     Effort: Pulmonary effort is normal. No respiratory distress.     Breath sounds: Examination of the right-lower field reveals decreased breath  sounds. Examination of the left-lower field reveals decreased breath sounds. Decreased breath sounds and rhonchi present. No wheezing or rales.  Chest:     Chest wall: No tenderness.  Abdominal:     Palpations: Abdomen is soft.     Tenderness: There is no abdominal tenderness.  Musculoskeletal:        General: No swelling.     Cervical back: Normal range of motion and neck supple.     Right lower leg: No edema.     Left lower leg: No edema.  Skin:    General: Skin is warm and dry.     Capillary Refill: Capillary refill takes less than 2 seconds.     Coloration: Skin is not cyanotic or pale.  Neurological:     General: No focal deficit present.     Mental Status: She is alert and oriented to person, place, and time.  Psychiatric:        Mood and Affect: Mood normal.        Behavior: Behavior normal.     ED Results / Procedures / Treatments   Labs (all labs ordered are listed, but only abnormal results are displayed) Labs Reviewed  COMPREHENSIVE METABOLIC PANEL - Abnormal; Notable for the following components:      Result Value   Glucose, Bld 168 (*)    ALT 50 (*)    All other components within normal limits  BLOOD GAS, VENOUS - Abnormal; Notable for the following components:   Bicarbonate 31.7 (*)    Acid-Base Excess 5.9 (*)    All other components within normal limits  CBC WITH DIFFERENTIAL/PLATELET - Abnormal; Notable for the following components:   RBC 2.81 (*)    Hemoglobin 11.3 (*)    HCT 32.9 (*)    MCV 117.1 (*)    MCH 40.2 (*)    RDW 17.5 (*)    nRBC 1.0 (*)    Abs Immature Granulocytes 0.17 (*)    All other components within normal limits  BRAIN NATRIURETIC PEPTIDE  MAGNESIUM  CBC WITH DIFFERENTIAL/PLATELET  TROPONIN I (HIGH SENSITIVITY)    EKG EKG Interpretation  Date/Time:  Monday November 12 2021 13:31:05 EDT Ventricular Rate:  95 PR Interval:  124 QRS Duration: 86 QT Interval:  352 QTC Calculation: 442 R Axis:   104 Text Interpretation: Normal  sinus rhythm Rightward axis Confirmed by Doren Custard, Tanyika Barros ([id number])  on 11/12/2021 3:04:00 PM  Radiology CT Angio Chest PE W and/or Wo Contrast  Result Date: 11/12/2021 CLINICAL DATA:  High probability for pulmonary embolism. Shortness of breath. Pneumonia. History of ovarian cancer. EXAM: CT ANGIOGRAPHY CHEST WITH CONTRAST TECHNIQUE: Multidetector CT imaging of the chest was performed using the standard protocol during bolus administration of intravenous contrast. Multiplanar CT image reconstructions and MIPs were obtained to evaluate the vascular anatomy. RADIATION DOSE REDUCTION: This exam was performed according to the departmental dose-optimization program which includes automated exposure control, adjustment of the mA and/or kV according to patient size and/or use of iterative reconstruction technique. CONTRAST:  34m OMNIPAQUE IOHEXOL 350 MG/ML SOLN COMPARISON:  Chest x-ray same day. CT chest abdomen and pelvis 07/12/2021. FINDINGS: Cardiovascular: Satisfactory opacification of the pulmonary arteries to the segmental level. No evidence of pulmonary embolism. Normal heart size. No pericardial effusion. Right chest port catheter tip ends in the distal SVC. Mediastinum/Nodes: Large hiatal hernia with intrathoracic stomach is again seen. There are no enlarged lymph nodes. Visualized thyroid gland is within normal limits. Lungs/Pleura: High density pleural plaques in the left lung are unchanged. There is minimal atelectasis in the lower lobes bilaterally. The lungs are otherwise clear. There has been interval resolution of nodular densities in the left lower lobe likely related to resolved infection. Trachea and central airways are patent. No pleural effusion or pneumothorax. Upper Abdomen: Cholecystectomy clips are present. Surgical coils are seen near the tail of the pancreas. Musculoskeletal: Degenerative changes affect the spine. Review of the MIP images confirms the above findings. IMPRESSION: 1. No evidence for  pulmonary embolism. No acute cardiopulmonary process. 2. Interval resolution of nodular densities in the left lower lobe likely related to resolved infection. 3. Stable large hiatal hernia with intrathoracic stomach. Electronically Signed   By: ARonney AstersM.D.   On: 11/12/2021 17:10   DG Chest 2 View  Result Date: 11/12/2021 CLINICAL DATA:  Shortness of breath. EXAM: CHEST - 2 VIEW COMPARISON:  June 26, 2021. FINDINGS: The heart size and mediastinal contours are within normal limits. Both lungs are clear. Large hiatal hernia is noted. Right internal jugular Port-A-Cath is unchanged in position. The visualized skeletal structures are unremarkable. IMPRESSION: Large hiatal hernia.  No acute cardiopulmonary abnormality seen. Electronically Signed   By: JMarijo ConceptionM.D.   On: 11/12/2021 13:42    Procedures Procedures    Medications Ordered in ED Medications  lactated ringers infusion ( Intravenous New Bag/Given 11/12/21 1835)  ipratropium-albuterol (DUONEB) 0.5-2.5 (3) MG/3ML nebulizer solution 3 mL (3 mLs Nebulization Given 11/12/21 1531)  oxyCODONE (Oxy IR/ROXICODONE) immediate release tablet 10 mg (10 mg Oral Given 11/12/21 1617)  LORazepam (ATIVAN) tablet 0.5 mg (0.5 mg Oral Given 11/12/21 1617)  iohexol (OMNIPAQUE) 350 MG/ML injection 100 mL (75 mLs Intravenous Contrast Given 11/12/21 1652)    ED Course/ Medical Decision Making/ A&P                           Medical Decision Making Amount and/or Complexity of Data Reviewed Labs: ordered. Radiology: ordered.  Risk Prescription drug management.   This patient presents to the ED for concern of shortness of breath, this involves an extensive number of treatment options, and is a complaint that carries with it a high risk of complications and morbidity.  The differential diagnosis includes pneumonia, PE, viral illness, CHF, valvular abnormality, ACS, medication side effect, anxiety, anemia   Co morbidities that complicate the  patient  evaluation  migraine headaches, anxiety, tobacco use, GI bleed, anemia, GERD, ovarian cancer   Additional history obtained:  Additional history obtained from N/A External records from outside source obtained and reviewed including EMR   Lab Tests:  I Ordered, and personally interpreted labs.  The pertinent results include: Baseline macrocytic anemia, no leukocytosis, normal electrolytes, normal BNP, normal troponin  Imaging Studies ordered:  I ordered imaging studies including chest x-ray, CTA chest I independently visualized and interpreted imaging which showed no acute findings I agree with the radiologist interpretation   Cardiac Monitoring: / EKG:  The patient was maintained on a cardiac monitor.  I personally viewed and interpreted the cardiac monitored which showed an underlying rhythm of: Sinus rhythm   Problem List / ED Course / Critical interventions / Medication management  Patient presenting for worsening shortness of breath over the past 5 days.  Additional symptoms include cough, fatigue, generalized weakness, and back pain.  Symptoms have been worsening despite antibiotics and prednisone that were prescribed to her last Thursday.  On arrival in the ED, patient is mildly tachycardic and tachypneic.  Vital signs, including SPO2 on room air are otherwise normal.  Diagnostic work-up was initiated.  Patient was given home pain medication as well as her home dose of Ativan.  She was given DuoNeb.  Laboratory work-up shows reassuring results.  She has no new anemia, no electrolyte abnormalities, normal BNP, and normal troponin.  She underwent a CTA of her chest which did not show any evidence of PE, pneumonia, or pulmonary edema.  On reassessment, patient's vital signs have normalized.  Her breathing is unlabored.  She was informed of reassuring work-up results.  It does seem that she has some type of airway inflammation.  She is currently being treated for bronchitis with  steroids.  This could also be medication side effect from her olaparib.  She was advised to discuss this with her oncologist.  She was discharged in stable condition. I ordered medication including DuoNeb for shortness of breath; oxycodone for chronic pain; Ativan for anxiolysis Reevaluation of the patient after these medicines showed that the patient resolved I have reviewed the patients home medicines and have made adjustments as needed   Social Determinants of Health:  Has access to outpatient care         Final Clinical Impression(s) / ED Diagnoses Final diagnoses:  SOB (shortness of breath)    Rx / DC Orders ED Discharge Orders     None         Godfrey Pick, MD 11/12/21 1951

## 2021-11-19 ENCOUNTER — Other Ambulatory Visit (HOSPITAL_COMMUNITY): Payer: Self-pay

## 2021-11-19 DIAGNOSIS — J45909 Unspecified asthma, uncomplicated: Secondary | ICD-10-CM | POA: Diagnosis not present

## 2021-11-19 DIAGNOSIS — R5383 Other fatigue: Secondary | ICD-10-CM | POA: Diagnosis not present

## 2021-11-19 DIAGNOSIS — Z299 Encounter for prophylactic measures, unspecified: Secondary | ICD-10-CM | POA: Diagnosis not present

## 2021-11-21 ENCOUNTER — Inpatient Hospital Stay: Payer: Medicare Other | Attending: Hematology

## 2021-11-21 DIAGNOSIS — R200 Anesthesia of skin: Secondary | ICD-10-CM | POA: Diagnosis not present

## 2021-11-21 DIAGNOSIS — K5900 Constipation, unspecified: Secondary | ICD-10-CM | POA: Insufficient documentation

## 2021-11-21 DIAGNOSIS — C563 Malignant neoplasm of bilateral ovaries: Secondary | ICD-10-CM | POA: Diagnosis not present

## 2021-11-21 DIAGNOSIS — Z79899 Other long term (current) drug therapy: Secondary | ICD-10-CM | POA: Insufficient documentation

## 2021-11-21 DIAGNOSIS — D649 Anemia, unspecified: Secondary | ICD-10-CM

## 2021-11-21 DIAGNOSIS — D509 Iron deficiency anemia, unspecified: Secondary | ICD-10-CM | POA: Diagnosis not present

## 2021-11-21 DIAGNOSIS — Z87891 Personal history of nicotine dependence: Secondary | ICD-10-CM | POA: Diagnosis not present

## 2021-11-21 DIAGNOSIS — R1012 Left upper quadrant pain: Secondary | ICD-10-CM | POA: Diagnosis not present

## 2021-11-21 LAB — COMPREHENSIVE METABOLIC PANEL
ALT: 33 U/L (ref 0–44)
AST: 23 U/L (ref 15–41)
Albumin: 3.8 g/dL (ref 3.5–5.0)
Alkaline Phosphatase: 98 U/L (ref 38–126)
Anion gap: 8 (ref 5–15)
BUN: 16 mg/dL (ref 6–20)
CO2: 26 mmol/L (ref 22–32)
Calcium: 9.2 mg/dL (ref 8.9–10.3)
Chloride: 104 mmol/L (ref 98–111)
Creatinine, Ser: 0.75 mg/dL (ref 0.44–1.00)
GFR, Estimated: >60 mL/min (ref >60)
Glucose, Bld: 142 mg/dL — ABNORMAL HIGH (ref 70–99)
Potassium: 3.6 mmol/L (ref 3.5–5.1)
Sodium: 138 mmol/L (ref 135–145)
Total Bilirubin: 0.5 mg/dL (ref 0.3–1.2)
Total Protein: 6.9 g/dL (ref 6.5–8.1)

## 2021-11-21 LAB — CBC WITH DIFFERENTIAL/PLATELET
Abs Immature Granulocytes: 0.05 10*3/uL (ref 0.00–0.07)
Basophils Absolute: 0 10*3/uL (ref 0.0–0.1)
Basophils Relative: 0 %
Eosinophils Absolute: 0 10*3/uL (ref 0.0–0.5)
Eosinophils Relative: 0 %
HCT: 35.1 % — ABNORMAL LOW (ref 36.0–46.0)
Hemoglobin: 12 g/dL (ref 12.0–15.0)
Immature Granulocytes: 1 %
Lymphocytes Relative: 7 %
Lymphs Abs: 0.5 10*3/uL — ABNORMAL LOW (ref 0.7–4.0)
MCH: 40.8 pg — ABNORMAL HIGH (ref 26.0–34.0)
MCHC: 34.2 g/dL (ref 30.0–36.0)
MCV: 119.4 fL — ABNORMAL HIGH (ref 80.0–100.0)
Monocytes Absolute: 0.1 10*3/uL (ref 0.1–1.0)
Monocytes Relative: 1 %
Neutro Abs: 6.8 10*3/uL (ref 1.7–7.7)
Neutrophils Relative %: 91 %
Platelets: 306 10*3/uL (ref 150–400)
RBC: 2.94 MIL/uL — ABNORMAL LOW (ref 3.87–5.11)
RDW: 18.1 % — ABNORMAL HIGH (ref 11.5–15.5)
WBC: 7.5 10*3/uL (ref 4.0–10.5)
nRBC: 0.4 % — ABNORMAL HIGH (ref 0.0–0.2)

## 2021-11-21 LAB — FERRITIN: Ferritin: 233 ng/mL (ref 11–307)

## 2021-11-21 LAB — MAGNESIUM: Magnesium: 1.7 mg/dL (ref 1.7–2.4)

## 2021-11-21 LAB — IRON AND TIBC
Iron: 178 ug/dL — ABNORMAL HIGH (ref 28–170)
Saturation Ratios: 50 % — ABNORMAL HIGH (ref 10.4–31.8)
TIBC: 355 ug/dL (ref 250–450)
UIBC: 177 ug/dL

## 2021-11-21 MED ORDER — SODIUM CHLORIDE 0.9% FLUSH
10.0000 mL | Freq: Once | INTRAVENOUS | Status: AC
  Administered 2021-11-21: 10 mL via INTRAVENOUS

## 2021-11-21 MED ORDER — HEPARIN SOD (PORK) LOCK FLUSH 100 UNIT/ML IV SOLN
500.0000 [IU] | Freq: Once | INTRAVENOUS | Status: AC
  Administered 2021-11-21: 500 [IU] via INTRAVENOUS

## 2021-11-21 NOTE — Progress Notes (Signed)
Patients port flushed without difficulty.  Good blood return noted with no bruising or swelling noted at site.  Band aid applied.  VSS with discharge and left in satisfactory condition with no s/s of distress noted.   

## 2021-11-21 NOTE — Patient Instructions (Signed)
[facility]  Discharge Instructions: Thank you for choosing [facility] to provide your oncology and hematology care.  If you have a lab appointment with the [facility], please come in thru the Main Entrance and check in at the main information desk.  Wear comfortable clothing and clothing appropriate for easy access to any Portacath or PICC line.   We strive to give you quality time with your provider. You may need to reschedule your appointment if you arrive late (15 or more minutes).  Arriving late affects you and other patients whose appointments are after yours.  Also, if you miss three or more appointments without notifying the office, you may be dismissed from the clinic at the provider's discretion.      For prescription refill requests, have your pharmacy contact our office and allow 72 hours for refills to be completed.    Today you received the following chemotherapy and/or immunotherapy agents port flush labs      To help prevent nausea and vomiting after your treatment, we encourage you to take your nausea medication as directed.  BELOW ARE SYMPTOMS THAT SHOULD BE REPORTED IMMEDIATELY: *FEVER GREATER THAN 100.4 F (38 C) OR HIGHER *CHILLS OR SWEATING *NAUSEA AND VOMITING THAT IS NOT CONTROLLED WITH YOUR NAUSEA MEDICATION *UNUSUAL SHORTNESS OF BREATH *UNUSUAL BRUISING OR BLEEDING *URINARY PROBLEMS (pain or burning when urinating, or frequent urination) *BOWEL PROBLEMS (unusual diarrhea, constipation, pain near the anus) TENDERNESS IN MOUTH AND THROAT WITH OR WITHOUT PRESENCE OF ULCERS (sore throat, sores in mouth, or a toothache) UNUSUAL RASH, SWELLING OR PAIN  UNUSUAL VAGINAL DISCHARGE OR ITCHING   Items with * indicate a potential emergency and should be followed up as soon as possible or go to the Emergency Department if any problems should occur.  Please show the CHEMOTHERAPY ALERT CARD or IMMUNOTHERAPY ALERT CARD at check-in to the  Emergency Department and triage nurse.  Should you have questions after your visit or need to cancel or reschedule your appointment, please contact [facility] [phone]  and follow the prompts.  Office hours are 8:00 a.m. to 4:30 p.m. Monday - Friday. Please note that voicemails left after 4:00 p.m. may not be returned until the following business day.  We are closed weekends and major holidays. You have access to a nurse at all times for urgent questions. Please call the main number to the clinic [phone] and follow the prompts.  For any non-urgent questions, you may also contact your provider using MyChart. We now offer e-Visits for anyone 8 and older to request care online for non-urgent symptoms. For details visit [email or web address].   Also download the MyChart app! Go to the app store, search "MyChart", open the app, select [facility], and log in with your MyChart username and password.  Masks are optional in the cancer centers. If you would like for your care team to wear a mask while they are taking care of you, please let them know. You may have one support person who is at least 52 years old accompany you for your appointments.

## 2021-11-22 LAB — CA 125: Cancer Antigen (CA) 125: 6.2 U/mL (ref 0.0–38.1)

## 2021-11-26 ENCOUNTER — Ambulatory Visit (INDEPENDENT_AMBULATORY_CARE_PROVIDER_SITE_OTHER): Payer: Medicare Other | Admitting: Psychiatry

## 2021-11-26 DIAGNOSIS — R531 Weakness: Secondary | ICD-10-CM | POA: Diagnosis not present

## 2021-11-26 DIAGNOSIS — M549 Dorsalgia, unspecified: Secondary | ICD-10-CM | POA: Diagnosis not present

## 2021-11-26 DIAGNOSIS — F321 Major depressive disorder, single episode, moderate: Secondary | ICD-10-CM | POA: Diagnosis not present

## 2021-11-26 DIAGNOSIS — Z299 Encounter for prophylactic measures, unspecified: Secondary | ICD-10-CM | POA: Diagnosis not present

## 2021-11-26 NOTE — Progress Notes (Signed)
IN- PERSON  THERAPIST PROGRESS NOTE  Session Time: Monday 11/26/2021 11:10 AM - 11:54 AM   Participation Level: Active  Behavioral Response: CasualAlertDepressed  Type of Therapy: Individual Therapy  Treatment Goals addressed: Reduce frequency, intensity, and duration of depression symptoms AEB by pt participating in pleasant activities/socialization 3-4 x per week for 60 days. Michelle Holt WILL SCORE LESS THAN 10 ON THE PATIENT HEALTH QUESTIONNAIRE (PHQ-9) Michelle Holt WILL PRACTICE BEHAVIORAL ACTIVATION SKILLS  1-2 TIMES PER WEEK FOR THE NEXT 12 WEEKS   ProgressTowards Goals: progressing   Interventions: CBT and Supportive  Summary: Michelle Holt is a 51 y.o. female who is self -referred for services due to pt experiencing symptoms of depression. She was hospitlaized at [facility] at age 41 due to depression. Pt participated in outpatient therapy for about a year after husband died in 2016/03/31.  She states feeling as though she is just here and has no purpose.  She states getting up and doing the same thing every day.  Patient reports needing to talk to someone as her brain feels full and she cannot get things off her mind. Per patient's report this began about 3 months ago.  Her stressors include 39 year old son who resides with patient having a TBI, patient having ovarian cancer( currently in remission) husband dying in 31-Mar-2016 due to lung cancer, mother dying in March 31, 2012, and father dying in 03-31-2017.  Patient also reports stress related to 81 year old daughter having behavioral health issues.  Patient last was seen about 4 weeks ago. She reports increased symptoms of depression as reflected in the PHQ 2 and 9.  This mainly has been triggered by patient being sick with no pneumonia per her report.  She is feeling better today and reports trying to participate more in activities this past week. She reports enjoying recent trip to the zoo with her daughter and god children.  She also expresses relief about  having an assertive conversation with her friend/son's nurse regarding her concerns about nurse performing her job.  Patient reports setting clear expectations and says conversation went well.  Patient also reports feeling better as she has talked with her friend who agrees to take care of patient's son should something happen to patient.  Patient continues to express desire to have more purpose in her life.     Suicidal/Homicidal: Nowithout intent/plan  Therapist Response: Reviewed symptoms, administered PHQ 2 and 9. discussed results, discussed stressors, facilitated expression of thoughts and feelings, validated feelings, praised and reinforced patient's efforts to use assertiveness skills, discussed effects on mood/thoughts/behavior, assisted patient identify and examine her thoughts about providing care for her son as well as trying to participate in other activities, assisted patient examine her interactions with son's nurse, assisted patient identify realistic expectations of self, assisted patient identify ways to schedule time for self to pursue pleasurable activities congruent with her values, developed plan with patient to use planning forms to schedule activities for self outside the home 1-2 times per week.   Plan: Return again in 2 weeks.  Diagnosis: Major depressive disorder, single episode, moderate ([place])  Collaboration of Care: Primary Care Provider AEB patient is working with PCP Dr. Woody Seller  Patient/Guardian was advised Release of Information must be obtained prior to any record release in order to collaborate their care with an outside provider. Patient/Guardian was advised if they have not already done so to contact the registration department to sign all necessary forms in order for [place] to release information regarding their care.  Consent: Patient/Guardian gives verbal consent for treatment and assignment of benefits for services provided during this visit. Patient/Guardian  expressed understanding and agreed to proceed.   Alonza Smoker, LCSW 11/26/2021

## 2021-11-28 ENCOUNTER — Other Ambulatory Visit: Payer: Self-pay | Admitting: *Deleted

## 2021-11-28 ENCOUNTER — Inpatient Hospital Stay (HOSPITAL_BASED_OUTPATIENT_CLINIC_OR_DEPARTMENT_OTHER): Payer: Medicare Other | Admitting: Hematology

## 2021-11-28 VITALS — BP 116/90 | HR 98 | Temp 98.1°F | Resp 16 | Wt 190.7 lb

## 2021-11-28 DIAGNOSIS — K5900 Constipation, unspecified: Secondary | ICD-10-CM | POA: Diagnosis not present

## 2021-11-28 DIAGNOSIS — R200 Anesthesia of skin: Secondary | ICD-10-CM | POA: Diagnosis not present

## 2021-11-28 DIAGNOSIS — C563 Malignant neoplasm of bilateral ovaries: Secondary | ICD-10-CM | POA: Diagnosis not present

## 2021-11-28 DIAGNOSIS — D509 Iron deficiency anemia, unspecified: Secondary | ICD-10-CM | POA: Diagnosis not present

## 2021-11-28 DIAGNOSIS — R1012 Left upper quadrant pain: Secondary | ICD-10-CM | POA: Diagnosis not present

## 2021-11-28 DIAGNOSIS — Z87891 Personal history of nicotine dependence: Secondary | ICD-10-CM | POA: Diagnosis not present

## 2021-11-28 DIAGNOSIS — Z79899 Other long term (current) drug therapy: Secondary | ICD-10-CM | POA: Diagnosis not present

## 2021-11-28 MED ORDER — OXYCODONE HCL 20 MG PO TABS: 1.0000 | ORAL_TABLET | ORAL | 0 refills | Status: DC | PRN

## 2021-11-28 MED ORDER — FLUCONAZOLE 100 MG PO TABS: 100.0000 mg | ORAL_TABLET | Freq: Every day | ORAL | 0 refills | Status: DC

## 2021-11-28 NOTE — Progress Notes (Signed)
[facility] [place], [place] [address]   CLINIC:  Medical Oncology/Hematology  PCP:  [name], MD [address] / [place] [address] [phone]   REASON FOR VISIT:  Follow-up for high-grade serous ovarian cancer  PRIOR THERAPY:  1. Carboplatin and paclitaxel x 7 cycles from 08/24/2018 to 03/15/2019. 2. Laparoscopic TAH & BSO & omenectomy on 12/24/2018. 3.  6 cycles of carboplatin and paclitaxel from 03/05/2021 to 06/18/2021  NGS Results: not done  CURRENT THERAPY: olaparib  BRIEF ONCOLOGIC HISTORY:  Oncology History  Ovarian cancer, bilateral ([facility])  07/07/2018 Pathology Results   PLEURAL FLUID, LEFT (SPECIMEN 1 OF 1, COLLECTED 07/07/18): - MALIGNANT CELLS CONSISTENT WITH ADENOCARCINOMA - SEE COMMENT  Source Pleural Fluid, (Specimen 1 of 1, collected on 07/07/2018) Gross Specimen: Received is/are 1000cc of bloody red fluid with tissue. (TC:tc) Prepared: # Smears: 0 # Concentration Technique Slides (i.e. ThinPrep): 1 # Cell Block: 1 Conventional Additional Studies: Two Hematology slides labeled [id number] Comment The malignant cells are positive for cytokeratin 7, p53, WT-1, Pax-8, Moc31, ER (weak) and EMA but negative for cytokeratin 20, TTF-1, GATA-3, CDX-2 and D2-40. Overall, the phenotype is consistent with a gynecologic primary; clinical correlation recommended.   07/07/2018 Procedure   Successful ultrasound guided left thoracentesis yielding 2.0 L of pleural fluid   07/08/2018 Procedure   1. Technically successful placement of left 14 French pigtail chest drain, placed to Pleur-evac water-seal.   07/08/2018 Procedure   1. Technically successful five [place] double lumen power injectable PICC placement   07/09/2018 Imaging   Ct chest 1. There is a moderate, loculated left hydropneumothorax with a small air component and moderate fluid component. The largest loculated component is located posteriorly. There is a pigtail drainage catheter  about the lateral pleural space. There is no obvious etiology, such as obvious mass or pleural disease.   2. There is a small right pleural effusion with associated atelectasis or consolidation and a subpleural consolidation of the superior segment right lower lobe (series 4, image 56), of uncertain significance, possibly infectious or inflammatory   07/10/2018 Imaging   Ct abdomen and pelvis: 1. The bilateral ovaries are enlarged by heterogeneous appearing cystic lesions, measuring 5.3 x 4.2 cm on the right (series 4, image 72) and 4.5 x 3.2 cm on the left (series 4, image 75). Consider dedicated pelvic ultrasound and/or pelvic MRI to further evaluate for solid components given high suspicion for GYN primary malignancy.   2. No other evidence of mass and no lymphadenopathy in the abdomen or pelvis.   3. Trace ascites. There is some suggestion of omental and peritoneal nodularity (e.g. Series 4, image 28), concerning for peritoneal metastatic disease.    4. Loculated left-sided pleural effusion with left-sided pleural drainage catheter in position. Small right pleural effusion   07/13/2018 Surgery   OPERATION: 1.  Left VATS (video-assisted thoracoscopic surgery) for drainage of loculated pleural effusion. 2.  Talc pleurodesis for malignant pleural effusion. 3.  Placement of PleurX catheter for management of malignant pleural effusion. 4.  Placement of On-Q analgesia catheter system.    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   07/13/2018 Pathology Results   Pleura, peel, Left Pleural - FIBRO-FIBRINOUS PLEURITIS - NEGATIVE FOR MALIGNANCY   07/18/2018 Initial Diagnosis   Ovarian cancer, bilateral ([facility])   07/20/2018 Procedure  EGD impression: Normal proximal esophagus and mid esophagus. Mild distal  esophageal rings; dilation not performed because of esophagitis. LA Grade C reflux esophagitis. Z-line regular, 30 cm from the incisors. 5 cm hiatal hernia. Non-bleeding gastric ulcer with no stigmata of bleeding. Gastritis. Duodenal erosions without bleeding. Normal second portion of the duodenum. No specimens collected.   08/19/2018 Genetic Testing   Negative genetic testing on the common hereditary cancer panel.  The Common Hereditary Gene Panel offered by [facility] includes sequencing and/or deletion duplication testing of the following 48 genes: APC, ATM, AXIN2, BARD1, BMPR1A, BRCA1, BRCA2, BRIP1, CDH1, CDK4, CDKN2A (p14ARF), CDKN2A (p16INK4a), CHEK2, CTNNA1, DICER1, EPCAM (Deletion/duplication testing only), GREM1 (promoter region deletion/duplication testing only), KIT, MEN1, MLH1, MSH2, MSH3, MSH6, MUTYH, NBN, NF1, NHTL1, PALB2, PDGFRA, PMS2, POLD1, POLE, PTEN, RAD50, RAD51C, RAD51D, RNF43, SDHB, SDHC, SDHD, SMAD4, SMARCA4. STK11, TP53, TSC1, TSC2, and VHL.  The following genes were evaluated for sequence changes only: SDHA and HOXB13 c.251G>A variant only. The report date is August 19, 2018.   08/24/2018 - 03/15/2019 Chemotherapy   Patient is on Treatment Plan : OVARIAN Carboplatin (AUC 6) / Paclitaxel (175) q21d x 6 cycles     03/05/2021 -  Chemotherapy   Patient is on Treatment Plan : OVARIAN Carboplatin (AUC 6) / Paclitaxel (175) q21d x 6 cycles       CANCER STAGING:  Cancer Staging  No matching staging information was found for the patient.  INTERVAL HISTORY:  [name], a 51 y.o. female, seen for follow-up of high-grade serous ovarian cancer.  She is tolerating olaparib twice daily very well.  She reported having noticed small brown spots on the right leg which slowly fade.  She reported cramping in her hands during daytime and feet and legs at nighttime.  This has been happening for the last 1 month.  REVIEW OF SYSTEMS:  Review of Systems  Respiratory:  Positive for  cough and shortness of breath.   Gastrointestinal:  Positive for nausea.  Neurological:  Positive for numbness (Cramping in the hands and feet).  All other systems reviewed and are negative.   PAST MEDICAL/SURGICAL HISTORY:  Past Medical History:  Diagnosis Date   Anemia    Anxiety and depression    Arthritis of facet joints at multiple vertebral levels    L5-S1   Constipation    Dyslipidemia    Family history of breast cancer    Family history of uterine cancer    GERD (gastroesophageal reflux disease)    History of hiatal hernia    History of kidney stones    Insomnia    Irritable bowel syndrome    Migraine    Muscle tension headache    Neuropathy of finger    Ovarian carcinoma ([facility])    ovarian   Plantar fasciitis of right foot    Port-A-Cath in place 08/20/2018   Past Surgical History:  Procedure Laterality Date   ABDOMINAL HYSTERECTOMY     BIOPSY  10/27/2020   Procedure: BIOPSY;  Surgeon: Harvel Quale, MD;  [facility];  Service: Gastroenterology;;   CHOLECYSTECTOMY  2008   COLONOSCOPY N/A 08/13/2013   Procedure: COLONOSCOPY;  Surgeon: Rogene Houston, MD;  [facility];  Service: Endoscopy;  Laterality: N/A;  230-moved to 145 Ann to notify pt   COLONOSCOPY WITH PROPOFOL N/A 11/15/2020   Procedure: COLONOSCOPY WITH PROPOFOL;  Surgeon: Rogene Houston, MD;  [facility];  Service: Endoscopy;  Laterality: N/A;  1:40   ESOPHAGEAL DILATION N/A 02/28/2021   Procedure: ESOPHAGEAL DILATION;  Surgeon: Rogene Houston, MD;  [facility];  Service: Endoscopy;  Laterality: N/A;   ESOPHAGOGASTRODUODENOSCOPY     ESOPHAGOGASTRODUODENOSCOPY (EGD) WITH PROPOFOL N/A 07/20/2018   Procedure: ESOPHAGOGASTRODUODENOSCOPY (EGD) WITH PROPOFOL;  Surgeon: Rogene Houston, MD;  [facility];  Service: Endoscopy;  Laterality: N/A;  Possible esophageal dilation.   ESOPHAGOGASTRODUODENOSCOPY (EGD) WITH PROPOFOL N/A 10/27/2020    Procedure: ESOPHAGOGASTRODUODENOSCOPY (EGD) WITH PROPOFOL;  Surgeon: Harvel Quale, MD;  [facility];  Service: Gastroenterology;  Laterality: N/A;  2:10, pt knows to arrive at 10:15   ESOPHAGOGASTRODUODENOSCOPY (EGD) WITH PROPOFOL N/A 02/28/2021   Procedure: ESOPHAGOGASTRODUODENOSCOPY (EGD) WITH PROPOFOL;  Surgeon: Rogene Houston, MD;  [facility];  Service: Endoscopy;  Laterality: N/A;  200 ASA 1   GIVENS CAPSULE STUDY N/A 11/24/2020   Procedure: GIVENS CAPSULE STUDY;  Surgeon: Rogene Houston, MD;  [facility];  Service: Endoscopy;  Laterality: N/A;  7:30   IR ANGIOGRAM SELECTIVE EACH ADDITIONAL VESSEL  08/01/2018   IR ANGIOGRAM VISCERAL SELECTIVE  08/01/2018   IR EMBO ART  VEN HEMORR LYMPH EXTRAV  INC GUIDE ROADMAPPING  08/01/2018   IR IMAGING GUIDED PORT INSERTION  08/20/2018   IR PERC PLEURAL DRAIN W/INDWELL CATH W/IMG GUIDE  07/08/2018   IR THORACENTESIS ASP PLEURAL SPACE W/IMG GUIDE  07/07/2018   IR US GUIDE VASC ACCESS RIGHT  08/01/2018   PLEURAL EFFUSION DRAINAGE Left 07/13/2018   Procedure: DRAINAGE OF LOCULATED PLEURAL EFFUSION;  Surgeon: Ivin Poot, MD;  Location: [facility];  Service: Thoracic;  Laterality: Left;   POLYPECTOMY  11/15/2020   Procedure: POLYPECTOMY;  Surgeon: Rogene Houston, MD;  [facility];  Service: Endoscopy;;   REMOVAL OF PLEURAL DRAINAGE CATHETER Left 08/20/2018   Procedure: REMOVAL OF PLEURAL DRAINAGE CATHETER;  Surgeon: Ivin Poot, MD;  Location: [facility];  Service: Thoracic;  Laterality: Left;   REMOVAL OF PLEURAL DRAINAGE CATHETER Left 08/20/2018   Procedure: REMOVAL OF PLEURAL DRAINAGE CATHETER;  Surgeon: Ivin Poot, MD;  Location: [facility];  Service: Thoracic;  Laterality: Left;   TALC PLEURODESIS Left 07/13/2018   Procedure: Talc Pleuradesis;  Surgeon: Prescott Gum, Collier Salina, MD;  Location: [facility];  Service: Thoracic;  Laterality: Left;   TOTAL HIP ARTHROPLASTY Left 06/05/2020   Procedure: LEFT TOTAL HIP  ARTHROPLASTY ANTERIOR APPROACH;  Surgeon: Leandrew Koyanagi, MD;  Location: [facility];  Service: Orthopedics;  Laterality: Left;  3-C   TUBAL LIGATION Bilateral    UTERINE ABLATION     VIDEO ASSISTED THORACOSCOPY Left 07/13/2018   Procedure: VIDEO ASSISTED THORACOSCOPY;  Surgeon: Ivin Poot, MD;  Location: [facility];  Service: Thoracic;  Laterality: Left;    SOCIAL HISTORY:  Social History   Socioeconomic History   Marital status: Widowed    Spouse name: Not on file   Number of children: 2   Years of education: 2-College   Highest education level: Not on file  Occupational History    Employer: [employer]  Tobacco Use   Smoking status: Former    Packs/day: 0.50    Years: 17.00    Total pack years: 8.50    Types: Cigarettes    Quit date: 06/22/2018    Years since quitting: 3.4   Smokeless tobacco: Never  Vaping Use   Vaping Use: Never used  Substance and Sexual Activity   Alcohol use: Not Currently  Alcohol/week: 1.0 standard drink of alcohol    Types: 1 Glasses of wine per week   Drug use: No   Sexual activity: Not Currently  Other Topics Concern   Not on file  Social History Narrative   Patient lives at home with her daughter.    Patient has 2 children.    Patient is widowed.    Patient is right handed.    Patient has her Associates degree.      Social Determinants of Health   Financial Resource Strain: Low Risk  (07/22/2018)   Overall Financial Resource Strain (CARDIA)    Difficulty of Paying Living Expenses: Not hard at all  Food Insecurity: No Food Insecurity (07/22/2018)   Hunger Vital Sign    Worried About Running Out of Food in the Last Year: Never true    Ran Out of Food in the Last Year: Never true  Transportation Needs: No Transportation Needs (07/22/2018)   PRAPARE - Hydrologist (Medical): No    Lack of Transportation (Non-Medical): No  Physical Activity: Inactive (07/22/2018)   Exercise Vital Sign    Days of Exercise per Week: 0 days     Minutes of Exercise per Session: 0 min  Stress: Stress Concern Present (07/22/2018)   [place]    Feeling of Stress : Very much  Social Connections: Moderately Isolated (07/22/2018)   Social Connection and Isolation Panel [NHANES]    Frequency of Communication with Friends and Family: More than three times a week    Frequency of Social Gatherings with Friends and Family: More than three times a week    Attends Religious Services: 1 to 4 times per year    Active Member of [employer] or Organizations: No    Attends Archivist Meetings: Never    Marital Status: Widowed  Intimate Partner Violence: Not At Risk (07/22/2018)   Humiliation, Afraid, Rape, and Kick questionnaire    Fear of Current or Ex-Partner: No    Emotionally Abused: No    Physically Abused: No    Sexually Abused: No    FAMILY HISTORY:  Family History  Problem Relation Age of Onset   Depression Mother    Hypertension Mother    Obesity Mother    Diabetes Mother    Kidney disease Mother    Peripheral vascular disease Father    Atrial fibrillation Father    Crohn's disease Sister    Uterine cancer Sister 35       maternal half sister   COPD Brother    Osteoporosis Brother    Breast cancer Maternal Aunt 69   Colon cancer Neg Hx     CURRENT MEDICATIONS:  Current Outpatient Medications  Medication Sig Dispense Refill   albuterol (VENTOLIN HFA) 108 (90 Base) MCG/ACT inhaler Inhale 2 puffs into the lungs every 6 (six) hours as needed for wheezing or shortness of breath.     alum & mag hydroxide-simeth (MAALOX MAX) 400-400-40 MG/5ML suspension Swish and swallow 5 ml four times daily. 480 mL 1   B Complex-C (B-COMPLEX WITH VITAMIN C) tablet Take 1 tablet by mouth daily.     Cholecalciferol (VITAMIN D) 50 MCG (2000 UT) CAPS Take 2,000 Units by mouth daily.     dexamethasone (DECADRON) 4 MG tablet Take 1 tablet (4 mg total) by mouth daily. Three days  after each treatment 30 tablet 2   diazepam (VALIUM) 5 MG tablet TAKE ONE TABLET  BY MOUTH AT BEDTIME 30 tablet 5   dicyclomine (BENTYL) 10 MG capsule Take 1 capsule (10 mg total) by mouth 3 (three) times daily as needed for spasms. 90 capsule 3   fluconazole (DIFLUCAN) 100 MG tablet Take 1 tablet by mouth daily for 7 days for thrush 7 tablet 0   folic acid (FOLVITE) 1 MG tablet Take 1 tablet (1 mg total) by mouth daily. 30 tablet 1   guaiFENesin (MUCINEX) 600 MG 12 hr tablet Take 1 tablet (600 mg total) by mouth 2 (two) times daily. 10 tablet 0   lactulose (CHRONULAC) 10 GM/15ML solution TAKE ONE TABLESPOONFUL (15ML) EVERY 3 HOURS UNTIL BOWEL MOVEMENT ACHIEVED THEN TAKEN DAILY AS NEEDED 480 mL 6   levofloxacin (LEVAQUIN) 500 MG tablet Take 500 mg by mouth daily. Take twice daily per pt     lidocaine-prilocaine (EMLA) cream Apply 1 application  topically as needed (pain).     linaclotide (LINZESS) 290 MCG CAPS capsule Take 1 capsule (290 mcg total) by mouth daily before breakfast. 30 capsule 6   LORazepam (ATIVAN) 0.5 MG tablet Take 0.5 mg by mouth 3 (three) times daily as needed.     magic mouthwash w/lidocaine SOLN Take 5 mLs by mouth 3 (three) times daily as needed for mouth pain (Swish and spit). 480 mL 6   magnesium oxide (MAG-OX) 400 (240 Mg) MG tablet Take 1 tablet (400 mg total) by mouth 2 (two) times daily. 60 tablet 6   mirabegron ER (MYRBETRIQ) 25 MG TB24 tablet Take 1 tablet (25 mg total) by mouth daily. 28 tablet 0   olaparib (LYNPARZA) 150 MG tablet Take 2 tablets (300 mg total) by mouth 2 (two) times daily. Swallow whole. May take with food to decrease nausea and vomiting. 120 tablet 2   omeprazole (PRILOSEC) 40 MG capsule TAKE ONE CAPSULE BY MOUTH TWICE DAILY BEFORE MEALS 60 capsule 5   Oxycodone HCl 20 MG TABS Take 1 tablet (20 mg total) by mouth every 4 (four) hours as needed. 180 tablet 0   predniSONE (DELTASONE) 10 MG tablet Prednisone 40 mg po daily x 1 day then Prednisone 30 mg  po daily x 1 day then Prednisone 20 mg po daily x 1 day then Prednisone 10 mg daily x 1 day then stop... 10 tablet 0   prochlorperazine (COMPAZINE) 10 MG tablet Take 1 tablet (10 mg total) by mouth 2 (two) times daily as needed for nausea or vomiting. Take 30 minutes before Lynparza twice daily. 60 tablet 3   promethazine (PHENERGAN) 25 MG tablet Take 1 tablet (25 mg total) by mouth every 6 (six) hours as needed for nausea or vomiting. 60 tablet 4   sennosides-docusate sodium (SENOKOT-S) 8.6-50 MG tablet Take 1 tablet by mouth 2 (two) times daily.     SUMAtriptan (IMITREX) 100 MG tablet TAKE ONE TABLET BY MOUTH PRN UP to TWICE DAILY AS NEEDED. (Patient taking differently: Take 100 mg by mouth 2 (two) times daily as needed for migraine.) 9 tablet 5   triamcinolone cream (KENALOG) 0.1 % Apply 1 application topically 2 (two) times daily as needed (rash). 80 g 11   valACYclovir (VALTREX) 1000 MG tablet Take 1,000 mg by mouth 3 (three) times daily.     venlafaxine (EFFEXOR) 75 MG tablet Take 1 tablet (75 mg total) by mouth daily. 30 tablet 3   No current facility-administered medications for this visit.   Facility-Administered Medications Ordered in Other Visits  Medication Dose Route Frequency Provider Last Rate Last  Admin   0.9 %  sodium chloride infusion   Intravenous Continuous Derek Jack, MD   Stopped at 10/23/20 1547   sodium chloride flush (NS) 0.9 % injection 10 mL  10 mL Intravenous PRN Derek Jack, MD   10 mL at 10/23/20 1544    ALLERGIES:  Allergies  Allergen Reactions   Morphine And Related Itching   Nickel Itching   Nortriptyline Other (See Comments)    Significant weight gain   Topamax [Topiramate] Diarrhea and Nausea Only   Xanax [Alprazolam]     Can't wake up    Actifed Cold-Allergy [Chlorpheniramine-Phenyleph Er] Rash    Red dye only   Amoxicillin Rash    Did it involve swelling of the face/tongue/throat, SOB, or low BP? Unknown Did it involve sudden or  severe rash/hives, skin peeling, or any reaction on the inside of your mouth or nose? Unknown Did you need to seek medical attention at a hospital or doctor's office? Unknown When did it last happen? teenager       If all above answers are "NO", may proceed with cephalosporin use.    Codeine Hives   Erythromycin Rash   Penicillins Rash    Did it involve swelling of the face/tongue/throat, SOB, or low BP? Unknown Did it involve sudden or severe rash/hives, skin peeling, or any reaction on the inside of your mouth or nose? Unknown Did you need to seek medical attention at a hospital or doctor's office? Unknown When did it last happen? teenager       If all above answers are "NO", may proceed with cephalosporin use.    Sudafed [Pseudoephedrine Hcl] Rash    Red dye only    PHYSICAL EXAM:  Performance status (ECOG): 1 - Symptomatic but completely ambulatory  There were no vitals filed for this visit.  Wt Readings from Last 3 Encounters:  11/12/21 185 lb (83.9 kg)  10/17/21 189 lb (85.7 kg)  10/09/21 188 lb 11.2 oz (85.6 kg)   Physical Exam Vitals reviewed.  Constitutional:      Appearance: Normal appearance. She is obese.  Cardiovascular:     Rate and Rhythm: Normal rate and regular rhythm.     Pulses: Normal pulses.     Heart sounds: Normal heart sounds.  Pulmonary:     Effort: Pulmonary effort is normal.     Breath sounds: Normal breath sounds.  Neurological:     General: No focal deficit present.     Mental Status: She is alert and oriented to person, place, and time.  Psychiatric:        Mood and Affect: Mood normal.        Behavior: Behavior normal.     LABORATORY DATA:  I have reviewed the labs as listed.     Latest Ref Rng & Units 11/21/2021    2:16 PM 11/12/2021    5:44 PM 10/16/2021    1:12 PM  CBC  WBC 4.0 - 10.5 K/uL 7.5  7.2  5.2   Hemoglobin 12.0 - 15.0 g/dL 12.0  11.3  11.0   Hematocrit 36.0 - 46.0 % 35.1  32.9  32.4   Platelets 150 - 400 K/uL 306  244   239       Latest Ref Rng & Units 11/21/2021    2:16 PM 11/12/2021    3:15 PM 10/16/2021    1:12 PM  CMP  Glucose 70 - 99 mg/dL 142  168  138   BUN 6 - 20 mg/dL  _0 Creatinine 0.44 - 1.00 mg/dL 0.75  0.92  0.78   Sodium 135 - 145 mmol/L 138  136  137   Potassium 3.5 - 5.1 mmol/L 3.6  5.0  3.7   Chloride 98 - 111 mmol/L 104  103  103   CO2 22 - 32 mmol/L _1 Calcium 8.9 - 10.3 mg/dL 9.2  9.3  8.9   Total Protein 6.5 - 8.1 g/dL 6.9  7.0  6.7   Total Bilirubin 0.3 - 1.2 mg/dL 0.5  1.0  0.7   Alkaline Phos 38 - 126 U/L 98  105  126   AST 15 - 41 U/L 23  34  24   ALT 0 - 44 U/L 33  50  40     DIAGNOSTIC IMAGING:  I have independently reviewed the scans and discussed with the patient. CT Angio Chest PE W and/or Wo Contrast  Result Date: 11/12/2021 CLINICAL DATA:  High probability for pulmonary embolism. Shortness of breath. Pneumonia. History of ovarian cancer. EXAM: CT ANGIOGRAPHY CHEST WITH CONTRAST TECHNIQUE: Multidetector CT imaging of the chest was performed using the standard protocol during bolus administration of intravenous contrast. Multiplanar CT image reconstructions and MIPs were obtained to evaluate the vascular anatomy. RADIATION DOSE REDUCTION: This exam was performed according to the departmental dose-optimization program which includes automated exposure control, adjustment of the mA and/or kV according to patient size and/or use of iterative reconstruction technique. CONTRAST:  65m OMNIPAQUE IOHEXOL 350 MG/ML SOLN COMPARISON:  Chest x-ray same day. CT chest abdomen and pelvis 07/12/2021. FINDINGS: Cardiovascular: Satisfactory opacification of the pulmonary arteries to the segmental level. No evidence of pulmonary embolism. Normal heart size. No pericardial effusion. Right chest port catheter tip ends in the distal SVC. Mediastinum/Nodes: Large hiatal hernia with intrathoracic stomach is again seen. There are no enlarged lymph nodes. Visualized thyroid gland  is within normal limits. Lungs/Pleura: High density pleural plaques in the left lung are unchanged. There is minimal atelectasis in the lower lobes bilaterally. The lungs are otherwise clear. There has been interval resolution of nodular densities in the left lower lobe likely related to resolved infection. Trachea and central airways are patent. No pleural effusion or pneumothorax. Upper Abdomen: Cholecystectomy clips are present. Surgical coils are seen near the tail of the pancreas. Musculoskeletal: Degenerative changes affect the spine. Review of the MIP images confirms the above findings. IMPRESSION: 1. No evidence for pulmonary embolism. No acute cardiopulmonary process. 2. Interval resolution of nodular densities in the left lower lobe likely related to resolved infection. 3. Stable large hiatal hernia with intrathoracic stomach. Electronically Signed   By: ARonney AstersM.D.   On: 11/12/2021 17:10   DG Chest 2 View  Result Date: 11/12/2021 CLINICAL DATA:  Shortness of breath. EXAM: CHEST - 2 VIEW COMPARISON:  June 26, 2021. FINDINGS: The heart size and mediastinal contours are within normal limits. Both lungs are clear. Large hiatal hernia is noted. Right internal jugular Port-A-Cath is unchanged in position. The visualized skeletal structures are unremarkable. IMPRESSION: Large hiatal hernia.  No acute cardiopulmonary abnormality seen. Electronically Signed   By: JMarijo ConceptionM.D.   On: 11/12/2021 13:42     ASSESSMENT:  1.  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 -Robotic assisted laparoscopic TAH and BSO and omentectomy on 12/24/2018, pathology showing high-grade serous carcinoma,  PT3P NX. -Germline mutation testing was negative. -3 more cycles of adjuvant chemotherapy completed on 03/15/2019. -CTAP on 04/13/2019 showed no findings of active malignancy.  28%  reduction in the volume of presumed chronic hematoma/chronic fluid collection splaying the upper margin of the spleen.  Large type III hiatal hernia. -CTAP on 10/13/2019 shows no findings of recurrence or metastatic disease. - Foundation 1 shows HRD+, LOH score>16%.  MSI-stable.  MYC amplification.  T p53 mutation. - We reviewed CT CAP from 02/21/2021 which showed progressive peritoneal metastasis compared to 12/06/2020 scan.  No bowel obstruction.  Small right pleural effusion with mild enlargement of an isolated left external iliac lymph node. - Reviewed EGD from 02/28/2021 which showed hiatal hernia and normal findings. - She reported epigastric and left upper quadrant pain worse in the last 1 week which is related to progression of her malignancy. - 6 cycles of carboplatin and paclitaxel from 03/13/2021 through 06/18/2021 - CT CAP (07/12/2021): No evidence of recurrence or metastatic disease. - Olaparib 300 mg twice daily started on 08/22/2021.    PLAN:  1.  Clinical stage IVa high-grade serous ovarian cancer, positive cytology of left pleural effusion, HRD positive: - CT CAP (07/12/2021): No evidence of recurrence or metastatic disease in the chest, abdomen or pelvis.  Previously seen peritoneal/omental/nodal metastatic disease is resolved. - She is tolerating olaparib 300 mg twice daily very well. - Reviewed labs from 11/22/2021 which shows normal LFTs.  CBC was grossly normal with microcytosis and normal hemoglobin. - CA125 is 6.2. - Continue olaparib twice daily.  When she takes Diflucan, she will cut back olaparib to 150 mg twice daily. - Recommend RTC in 6 weeks.  Repeat CTAP with contrast and tumor marker prior to next visit.   2.  Lower back/left upper quadrant pain: - Continue oxycodone 20 mg every 4-6 hours as needed.  This has been stable.   3.  Numbness in the feet/cramping in the hands: - She reported cramping in the hands during daytime and in the feet and legs at nighttime. - I have  recommended her to start taking gabapentin 100 mg at bedtime.  4.  Iron deficiency state: - Last Feraheme was on 09/27/2021. - Hemoglobin improved to 12.  Ferritin is 233.  5.  Constipation: - Continue Linzess as needed.  6.  Hypomagnesemia: - Continue magnesium twice daily.  Magnesium is normal.   Orders placed this encounter:  No orders of the defined types were placed in this encounter.     Derek Jack, MD [place] [phone]

## 2021-11-28 NOTE — Patient Instructions (Addendum)
[place] at [facility] Discharge Instructions   You were seen and examined today by Dr. Delton Coombes.  He reviewed the results of your lab work which are normal/stable.   Start taking gabapentin 100 mg at bedtime to help with the cramping.   We sent a prescription for Diflucan to your pharmacy. While taking this, only take 1 Lynparza pill in the morning and 1 in the evening.   We will repeat a CT scan prior to your next visit.   Return as scheduled.    Thank you for choosing [place] at [facility] to provide your oncology and hematology care.  To afford each patient quality time with our provider, please arrive at least 15 minutes before your scheduled appointment time.   If you have a lab appointment with the [facility] please come in thru the Main Entrance and check in at the main information desk.  You need to re-schedule your appointment should you arrive 10 or more minutes late.  We strive to give you quality time with our providers, and arriving late affects you and other patients whose appointments are after yours.  Also, if you no show three or more times for appointments you may be dismissed from the clinic at the providers discretion.     Again, thank you for choosing [facility].  Our hope is that these requests will decrease the amount of time that you wait before being seen by our physicians.       _____________________________________________________________  Should you have questions after your visit to [facility], please contact our office at [phone] and follow the prompts.  Our office hours are 8:00 a.m. and 4:30 p.m. Monday - Friday.  Please note that voicemails left after 4:00 p.m. may not be returned until the following business day.  We are closed weekends and major holidays.  You do have access to a nurse 24-7, just call the main number to the clinic [phone] and do not press any  options, hold on the line and a nurse will answer the phone.    For prescription refill requests, have your pharmacy contact our office and allow 72 hours.    Due to Covid, you will need to wear a mask upon entering the hospital. If you do not have a mask, a mask will be given to you at the Main Entrance upon arrival. For doctor visits, patients may have 1 support person age 58 or older with them. For treatment visits, patients can not have anyone with them due to social distancing guidelines and our immunocompromised population.

## 2021-11-28 NOTE — Progress Notes (Signed)
Patient is taking [place] as prescribed.  She has not missed any doses and reports no side effects at this time.

## 2021-11-29 ENCOUNTER — Other Ambulatory Visit (HOSPITAL_COMMUNITY): Payer: Self-pay

## 2021-12-03 ENCOUNTER — Other Ambulatory Visit (HOSPITAL_COMMUNITY): Payer: Self-pay

## 2021-12-05 ENCOUNTER — Other Ambulatory Visit (HOSPITAL_COMMUNITY): Payer: Self-pay

## 2021-12-10 ENCOUNTER — Encounter (HOSPITAL_COMMUNITY): Payer: Self-pay

## 2021-12-10 ENCOUNTER — Ambulatory Visit (HOSPITAL_COMMUNITY): Payer: Medicare Other | Admitting: Psychiatry

## 2021-12-10 ENCOUNTER — Telehealth (HOSPITAL_COMMUNITY): Payer: Self-pay | Admitting: Psychiatry

## 2021-12-10 NOTE — Telephone Encounter (Signed)
Therapist called patient regarding scheduled appointment and received voicemail message.  Therapist left message requesting patient call office.

## 2021-12-11 ENCOUNTER — Other Ambulatory Visit (HOSPITAL_COMMUNITY): Payer: Self-pay

## 2021-12-11 ENCOUNTER — Telehealth (HOSPITAL_COMMUNITY): Payer: Self-pay

## 2021-12-11 NOTE — Telephone Encounter (Signed)
Pt called stating that she did cancel the appt due to having to take her son to a procedure in [place]. She said she did call and will be at next appt.

## 2021-12-24 ENCOUNTER — Ambulatory Visit (INDEPENDENT_AMBULATORY_CARE_PROVIDER_SITE_OTHER): Payer: Medicare Other | Admitting: Psychiatry

## 2021-12-24 DIAGNOSIS — F321 Major depressive disorder, single episode, moderate: Secondary | ICD-10-CM | POA: Diagnosis not present
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
## 2021-12-25 DIAGNOSIS — Z299 Encounter for prophylactic measures, unspecified: Secondary | ICD-10-CM | POA: Diagnosis not present

## 2021-12-25 DIAGNOSIS — M549 Dorsalgia, unspecified: Secondary | ICD-10-CM | POA: Diagnosis not present

## 2021-12-25 DIAGNOSIS — R5383 Other fatigue: Secondary | ICD-10-CM | POA: Diagnosis not present

## 2021-12-25 NOTE — Telephone Encounter (Signed)
I did not see anything in the literature to include the symptoms she is describing.   Please advise.

## 2021-12-26 ENCOUNTER — Telehealth: Payer: Self-pay

## 2021-12-26 NOTE — Telephone Encounter (Addendum)
Oral Oncology Patient Advocate Encounter   Received notification that prior authorization for Michelle Holt is due for renewal.   PA submitted on 12/26/21  Key [id number]   Status is pending     [name], [place] [facility] [place]  [phone] (phone) [phone] (fax) 12/26/2021 9:05 AM

## 2021-12-26 NOTE — Telephone Encounter (Signed)
Oral Oncology Patient Advocate Encounter  Prior Authorization for Michelle Holt has been approved.    PA# [id number]  Effective dates: 12.13.23 through 12.31.24    [name], [place] Patient Speculator  [phone] (phone) [phone] (fax) 12/26/2021 10:20 AM

## 2022-01-01 ENCOUNTER — Other Ambulatory Visit: Payer: Self-pay

## 2022-01-02 ENCOUNTER — Other Ambulatory Visit: Payer: Self-pay

## 2022-01-02 DIAGNOSIS — C563 Malignant neoplasm of bilateral ovaries: Secondary | ICD-10-CM

## 2022-01-03 DIAGNOSIS — J069 Acute upper respiratory infection, unspecified: Secondary | ICD-10-CM | POA: Diagnosis not present

## 2022-01-03 DIAGNOSIS — R5383 Other fatigue: Secondary | ICD-10-CM | POA: Diagnosis not present

## 2022-01-03 DIAGNOSIS — Z299 Encounter for prophylactic measures, unspecified: Secondary | ICD-10-CM | POA: Diagnosis not present

## 2022-01-04 ENCOUNTER — Ambulatory Visit (HOSPITAL_COMMUNITY)
Admission: RE | Admit: 2022-01-04 | Discharge: 2022-01-04 | Disposition: A | Discharge status: Home / Self Care | Payer: Medicare Other | Source: Ambulatory Visit | Attending: Hematology | Admitting: Hematology

## 2022-01-04 ENCOUNTER — Inpatient Hospital Stay: Payer: Medicare Other | Attending: Hematology

## 2022-01-04 ENCOUNTER — Other Ambulatory Visit: Payer: Self-pay | Admitting: Hematology

## 2022-01-04 DIAGNOSIS — K5900 Constipation, unspecified: Secondary | ICD-10-CM | POA: Diagnosis not present

## 2022-01-04 DIAGNOSIS — Z90722 Acquired absence of ovaries, bilateral: Secondary | ICD-10-CM | POA: Diagnosis not present

## 2022-01-04 DIAGNOSIS — Z79899 Other long term (current) drug therapy: Secondary | ICD-10-CM | POA: Insufficient documentation

## 2022-01-04 DIAGNOSIS — Z9221 Personal history of antineoplastic chemotherapy: Secondary | ICD-10-CM | POA: Insufficient documentation

## 2022-01-04 DIAGNOSIS — Z90710 Acquired absence of both cervix and uterus: Secondary | ICD-10-CM | POA: Insufficient documentation

## 2022-01-04 DIAGNOSIS — Z9079 Acquired absence of other genital organ(s): Secondary | ICD-10-CM | POA: Insufficient documentation

## 2022-01-04 DIAGNOSIS — C563 Malignant neoplasm of bilateral ovaries: Secondary | ICD-10-CM | POA: Insufficient documentation

## 2022-01-04 DIAGNOSIS — Z87891 Personal history of nicotine dependence: Secondary | ICD-10-CM | POA: Insufficient documentation

## 2022-01-04 DIAGNOSIS — R1012 Left upper quadrant pain: Secondary | ICD-10-CM | POA: Insufficient documentation

## 2022-01-04 DIAGNOSIS — D509 Iron deficiency anemia, unspecified: Secondary | ICD-10-CM | POA: Diagnosis not present

## 2022-01-04 LAB — CBC WITH DIFFERENTIAL/PLATELET
Abs Immature Granulocytes: 0 10*3/uL (ref 0.00–0.07)
Basophils Absolute: 0 10*3/uL (ref 0.0–0.1)
Basophils Relative: 0 %
Eosinophils Absolute: 0 10*3/uL (ref 0.0–0.5)
Eosinophils Relative: 0 %
HCT: 37.1 % (ref 36.0–46.0)
Hemoglobin: 12.8 g/dL (ref 12.0–15.0)
Lymphocytes Relative: 19 %
Lymphs Abs: 2.1 10*3/uL (ref 0.7–4.0)
MCH: 41.2 pg — ABNORMAL HIGH (ref 26.0–34.0)
MCHC: 34.5 g/dL (ref 30.0–36.0)
MCV: 119.3 fL — ABNORMAL HIGH (ref 80.0–100.0)
Monocytes Absolute: 0.8 10*3/uL (ref 0.1–1.0)
Monocytes Relative: 7 %
Neutro Abs: 8.1 10*3/uL — ABNORMAL HIGH (ref 1.7–7.7)
Neutrophils Relative %: 74 %
Platelets: 283 10*3/uL (ref 150–400)
RBC: 3.11 MIL/uL — ABNORMAL LOW (ref 3.87–5.11)
RDW: 14.4 % (ref 11.5–15.5)
WBC: 10.9 10*3/uL — ABNORMAL HIGH (ref 4.0–10.5)
nRBC: 0 % (ref 0.0–0.2)

## 2022-01-04 LAB — COMPREHENSIVE METABOLIC PANEL
ALT: 17 U/L (ref 0–44)
AST: 17 U/L (ref 15–41)
Albumin: 3.7 g/dL (ref 3.5–5.0)
Alkaline Phosphatase: 78 U/L (ref 38–126)
Anion gap: 7 (ref 5–15)
BUN: 19 mg/dL (ref 6–20)
CO2: 28 mmol/L (ref 22–32)
Calcium: 9.1 mg/dL (ref 8.9–10.3)
Chloride: 101 mmol/L (ref 98–111)
Creatinine, Ser: 0.73 mg/dL (ref 0.44–1.00)
GFR, Estimated: >60 mL/min (ref >60)
Glucose, Bld: 111 mg/dL — ABNORMAL HIGH (ref 70–99)
Potassium: 3.7 mmol/L (ref 3.5–5.1)
Sodium: 136 mmol/L (ref 135–145)
Total Bilirubin: 0.4 mg/dL (ref 0.3–1.2)
Total Protein: 6.8 g/dL (ref 6.5–8.1)

## 2022-01-04 LAB — MAGNESIUM: Magnesium: 1.8 mg/dL (ref 1.7–2.4)

## 2022-01-04 MED ORDER — HEPARIN SOD (PORK) LOCK FLUSH 100 UNIT/ML IV SOLN
500.0000 [IU] | Freq: Once | INTRAVENOUS | Status: AC
  Administered 2022-01-04: 500 [IU] via INTRAVENOUS

## 2022-01-04 MED ORDER — IOHEXOL 9 MG/ML PO SOLN
ORAL | Status: AC
  Filled 2022-01-04: qty 500

## 2022-01-04 MED ORDER — IOHEXOL 300 MG/ML  SOLN
100.0000 mL | Freq: Once | INTRAMUSCULAR | Status: AC | PRN
  Administered 2022-01-04: 100 mL via INTRAVENOUS

## 2022-01-04 MED ORDER — HEPARIN SOD (PORK) LOCK FLUSH 100 UNIT/ML IV SOLN
INTRAVENOUS | Status: AC
  Filled 2022-01-04: qty 5

## 2022-01-04 MED ORDER — SODIUM CHLORIDE 0.9% FLUSH
10.0000 mL | Freq: Once | INTRAVENOUS | Status: AC
  Administered 2022-01-04: 10 mL via INTRAVENOUS

## 2022-01-04 NOTE — Progress Notes (Signed)
Patients port flushed without difficulty.  Good blood return noted with no bruising or swelling noted at site.  Band aid applied.  VSS with discharge and left in satisfactory condition with no s/s of distress noted.   

## 2022-01-04 NOTE — Patient Instructions (Signed)
[facility]  Discharge Instructions: Thank you for choosing [facility] to provide your oncology and hematology care.  If you have a lab appointment with the Cancer Center, please come in thru the Main Entrance and check in at the main information desk.  Wear comfortable clothing and clothing appropriate for easy access to any Portacath or PICC line.   We strive to give you quality time with your provider. You may need to reschedule your appointment if you arrive late (15 or more minutes).  Arriving late affects you and other patients whose appointments are after yours.  Also, if you miss three or more appointments without notifying the office, you may be dismissed from the clinic at the provider's discretion.      For prescription refill requests, have your pharmacy contact our office and allow 72 hours for refills to be completed.    Today you received the following chemotherapy and/or immunotherapy agents [place] flush      To help prevent nausea and vomiting after your treatment, we encourage you to take your nausea medication as directed.  BELOW ARE SYMPTOMS THAT SHOULD BE REPORTED IMMEDIATELY: *FEVER GREATER THAN 100.4 F (38 C) OR HIGHER *CHILLS OR SWEATING *NAUSEA AND VOMITING THAT IS NOT CONTROLLED WITH YOUR NAUSEA MEDICATION *UNUSUAL SHORTNESS OF BREATH *UNUSUAL BRUISING OR BLEEDING *URINARY PROBLEMS (pain or burning when urinating, or frequent urination) *BOWEL PROBLEMS (unusual diarrhea, constipation, pain near the anus) TENDERNESS IN MOUTH AND THROAT WITH OR WITHOUT PRESENCE OF ULCERS (sore throat, sores in mouth, or a toothache) UNUSUAL RASH, SWELLING OR PAIN  UNUSUAL VAGINAL DISCHARGE OR ITCHING   Items with * indicate a potential emergency and should be followed up as soon as possible or go to the Emergency Department if any problems should occur.  Please show the CHEMOTHERAPY ALERT CARD or IMMUNOTHERAPY ALERT CARD at check-in to the  Emergency Department and triage nurse.  Should you have questions after your visit or need to cancel or reschedule your appointment, please contact [facility] [phone]  and follow the prompts.  Office hours are 8:00 a.m. to 4:30 p.m. Monday - Friday. Please note that voicemails left after 4:00 p.m. may not be returned until the following business day.  We are closed weekends and major holidays. You have access to a nurse at all times for urgent questions. Please call the main number to the clinic [phone] and follow the prompts.  For any non-urgent questions, you may also contact your provider using MyChart. We now offer e-Visits for anyone 18 and older to request care online for non-urgent symptoms. For details visit [email or web address].   Also download the MyChart app! Go to the app store, search "MyChart", open the app, select [facility], and log in with your MyChart username and password.  Masks are optional in the cancer centers. If you would like for your care team to wear a mask while they are taking care of you, please let them know. You may have one support person who is at least 52 years old accompany you for your appointments.  

## 2022-01-05 LAB — CA 125: Cancer Antigen (CA) 125: 5.4 U/mL (ref 0.0–38.1)

## 2022-01-08 ENCOUNTER — Other Ambulatory Visit: Payer: Self-pay

## 2022-01-09 ENCOUNTER — Inpatient Hospital Stay (HOSPITAL_BASED_OUTPATIENT_CLINIC_OR_DEPARTMENT_OTHER): Payer: Medicare Other | Admitting: Hematology

## 2022-01-09 VITALS — BP 115/76 | HR 98 | Temp 98.5°F | Resp 17 | Ht 64.0 in | Wt 191.9 lb

## 2022-01-09 DIAGNOSIS — C563 Malignant neoplasm of bilateral ovaries: Secondary | ICD-10-CM

## 2022-01-09 DIAGNOSIS — R1012 Left upper quadrant pain: Secondary | ICD-10-CM | POA: Diagnosis not present

## 2022-01-09 DIAGNOSIS — Z79899 Other long term (current) drug therapy: Secondary | ICD-10-CM | POA: Diagnosis not present

## 2022-01-09 DIAGNOSIS — K5900 Constipation, unspecified: Secondary | ICD-10-CM | POA: Diagnosis not present

## 2022-01-09 DIAGNOSIS — Z9221 Personal history of antineoplastic chemotherapy: Secondary | ICD-10-CM | POA: Diagnosis not present

## 2022-01-09 DIAGNOSIS — D509 Iron deficiency anemia, unspecified: Secondary | ICD-10-CM | POA: Diagnosis not present

## 2022-01-09 DIAGNOSIS — Z87891 Personal history of nicotine dependence: Secondary | ICD-10-CM | POA: Diagnosis not present

## 2022-01-09 NOTE — Progress Notes (Signed)
[place], [place] [address]   CLINIC:  Medical Oncology/Hematology  PCP:  [name], MD [address] / [place] [address] [phone]   REASON FOR VISIT:  Follow-up for high-grade serous ovarian cancer  PRIOR THERAPY:  1. Carboplatin and paclitaxel x 7 cycles from 08/24/2018 to 03/15/2019. 2. Laparoscopic TAH & BSO & omenectomy on 12/24/2018. 3.  6 cycles of carboplatin and paclitaxel from 03/05/2021 to 06/18/2021  NGS Results: not done  CURRENT THERAPY: olaparib  BRIEF ONCOLOGIC HISTORY:  Oncology History  Ovarian cancer, bilateral ([place])  07/07/2018 Pathology Results   PLEURAL FLUID, LEFT (SPECIMEN 1 OF 1, COLLECTED 07/07/18): - MALIGNANT CELLS CONSISTENT WITH ADENOCARCINOMA - SEE COMMENT  Source Pleural Fluid, (Specimen 1 of 1, collected on 07/07/2018) Gross Specimen: Received is/are 1000cc of bloody red fluid with tissue. (TC:tc) Prepared: # Smears: 0 # Concentration Technique Slides (i.e. ThinPrep): 1 # Cell Block: 1 Conventional Additional Studies: Two Hematology slides labeled [id number] Comment The malignant cells are positive for cytokeratin 7, p53, WT-1, Pax-8, Moc31, ER (weak) and EMA but negative for cytokeratin 20, TTF-1, GATA-3, CDX-2 and D2-40. Overall, the phenotype is consistent with a gynecologic primary; clinical correlation recommended.   07/07/2018 Procedure   Successful ultrasound guided left thoracentesis yielding 2.0 L of pleural fluid   07/08/2018 Procedure   1. Technically successful placement of left 14 French pigtail chest drain, placed to Pleur-evac water-seal.   07/08/2018 Procedure   1. Technically successful five [place] double lumen power injectable PICC placement   07/09/2018 Imaging   Ct chest 1. There is a moderate, loculated left hydropneumothorax with a small air component and moderate fluid component. The largest loculated component is located posteriorly. There is a pigtail drainage catheter  about the lateral pleural space. There is no obvious etiology, such as obvious mass or pleural disease.   2. There is a small right pleural effusion with associated atelectasis or consolidation and a subpleural consolidation of the superior segment right lower lobe (series 4, image 56), of uncertain significance, possibly infectious or inflammatory   07/10/2018 Imaging   Ct abdomen and pelvis: 1. The bilateral ovaries are enlarged by heterogeneous appearing cystic lesions, measuring 5.3 x 4.2 cm on the right (series 4, image 72) and 4.5 x 3.2 cm on the left (series 4, image 75). Consider dedicated pelvic ultrasound and/or pelvic MRI to further evaluate for solid components given high suspicion for GYN primary malignancy.   2. No other evidence of mass and no lymphadenopathy in the abdomen or pelvis.   3. Trace ascites. There is some suggestion of omental and peritoneal nodularity (e.g. Series 4, image 28), concerning for peritoneal metastatic disease.    4. Loculated left-sided pleural effusion with left-sided pleural drainage catheter in position. Small right pleural effusion   07/13/2018 Surgery   OPERATION: 1.  Left VATS (video-assisted thoracoscopic surgery) for drainage of loculated pleural effusion. 2.  Talc pleurodesis for malignant pleural effusion. 3.  Placement of PleurX catheter for management of malignant pleural effusion. 4.  Placement of On-Q analgesia catheter system.    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   07/13/2018 Pathology Results   Pleura, peel, Left Pleural - FIBRO-FIBRINOUS PLEURITIS - NEGATIVE FOR MALIGNANCY   07/18/2018 Initial Diagnosis   Ovarian cancer, bilateral ([facility])   07/20/2018 Procedure  EGD impression: Normal proximal esophagus and mid esophagus. Mild distal  esophageal rings; dilation not performed because of esophagitis. LA Grade C reflux esophagitis. Z-line regular, 30 cm from the incisors. 5 cm hiatal hernia. Non-bleeding gastric ulcer with no stigmata of bleeding. Gastritis. Duodenal erosions without bleeding. Normal second portion of the duodenum. No specimens collected.   08/19/2018 Genetic Testing   Negative genetic testing on the common hereditary cancer panel.  The Common Hereditary Gene Panel offered by [facility] includes sequencing and/or deletion duplication testing of the following 48 genes: APC, ATM, AXIN2, BARD1, BMPR1A, BRCA1, BRCA2, BRIP1, CDH1, CDK4, CDKN2A (p14ARF), CDKN2A (p16INK4a), CHEK2, CTNNA1, DICER1, EPCAM (Deletion/duplication testing only), GREM1 (promoter region deletion/duplication testing only), KIT, MEN1, MLH1, MSH2, MSH3, MSH6, MUTYH, NBN, NF1, NHTL1, PALB2, PDGFRA, PMS2, POLD1, POLE, PTEN, RAD50, RAD51C, RAD51D, RNF43, SDHB, SDHC, SDHD, SMAD4, SMARCA4. STK11, TP53, TSC1, TSC2, and VHL.  The following genes were evaluated for sequence changes only: SDHA and HOXB13 c.251G>A variant only. The report date is August 19, 2018.   08/24/2018 - 03/15/2019 Chemotherapy   Patient is on Treatment Plan : OVARIAN Carboplatin (AUC 6) / Paclitaxel (175) q21d x 6 cycles     03/05/2021 - 06/18/2021 Chemotherapy   Patient is on Treatment Plan : OVARIAN Carboplatin (AUC 6) / Paclitaxel (175) q21d x 6 cycles       CANCER STAGING:  Cancer Staging  No matching staging information was found for the patient.  INTERVAL HISTORY:  [name], a 51 y.o. female, seen for follow-up of high-grade serous ovarian carcinoma.  She is tolerating olaparib 300 mg twice daily very well.  She reports that occasionally foods get stuck in the retrosternal area.  She is currently taking gabapentin 200 mg at bedtime, but continues to have cramping in the hands and feet.  REVIEW OF SYSTEMS:  Review of Systems  Neurological:  Positive for numbness  (Cramping in the hands and feet).  All other systems reviewed and are negative.   PAST MEDICAL/SURGICAL HISTORY:  Past Medical History:  Diagnosis Date   Anemia    Anxiety and depression    Arthritis of facet joints at multiple vertebral levels    L5-S1   Constipation    Dyslipidemia    Family history of breast cancer    Family history of uterine cancer    GERD (gastroesophageal reflux disease)    History of hiatal hernia    History of kidney stones    Insomnia    Irritable bowel syndrome    Migraine    Muscle tension headache    Neuropathy of finger    Ovarian carcinoma (HCC)    ovarian   Plantar fasciitis of right foot    Port-A-Cath in place 08/20/2018   Past Surgical History:  Procedure Laterality Date   ABDOMINAL HYSTERECTOMY     BIOPSY  10/27/2020   Procedure: BIOPSY;  Surgeon: Harvel Quale, MD;  [facility];  Service: Gastroenterology;;   CHOLECYSTECTOMY  2008   COLONOSCOPY N/A 08/13/2013   Procedure: COLONOSCOPY;  Surgeon: Rogene Houston, MD;  [facility];  Service: Endoscopy;  Laterality: N/A;  230-moved to 145 Ann to notify pt   COLONOSCOPY WITH PROPOFOL N/A 11/15/2020   Procedure: COLONOSCOPY WITH PROPOFOL;  Surgeon: Rogene Houston, MD;  [facility];  Service: Endoscopy;  Laterality: N/A;  1:40   ESOPHAGEAL DILATION N/A 02/28/2021   Procedure: ESOPHAGEAL DILATION;  Surgeon: Rogene Houston, MD;  [facility];  Service: Endoscopy;  Laterality: N/A;   ESOPHAGOGASTRODUODENOSCOPY     ESOPHAGOGASTRODUODENOSCOPY (EGD) WITH PROPOFOL N/A 07/20/2018   Procedure: ESOPHAGOGASTRODUODENOSCOPY (EGD) WITH PROPOFOL;  Surgeon: Rogene Houston, MD;  [facility];  Service: Endoscopy;  Laterality: N/A;  Possible esophageal dilation.   ESOPHAGOGASTRODUODENOSCOPY (EGD) WITH PROPOFOL N/A 10/27/2020   Procedure: ESOPHAGOGASTRODUODENOSCOPY (EGD) WITH PROPOFOL;  Surgeon: Harvel Quale, MD;  [facility];  Service: Gastroenterology;  Laterality: N/A;  2:10, pt knows to arrive at 10:15   ESOPHAGOGASTRODUODENOSCOPY (EGD) WITH PROPOFOL N/A 02/28/2021   Procedure: ESOPHAGOGASTRODUODENOSCOPY (EGD) WITH PROPOFOL;  Surgeon: Rogene Houston, MD;  [facility];  Service: Endoscopy;  Laterality: N/A;  200 ASA 1   GIVENS CAPSULE STUDY N/A 11/24/2020   Procedure: GIVENS CAPSULE STUDY;  Surgeon: Rogene Houston, MD;  [facility];  Service: Endoscopy;  Laterality: N/A;  7:30   IR ANGIOGRAM SELECTIVE EACH ADDITIONAL VESSEL  08/01/2018   IR ANGIOGRAM VISCERAL SELECTIVE  08/01/2018   IR EMBO ART  VEN HEMORR LYMPH EXTRAV  INC GUIDE ROADMAPPING  08/01/2018   IR IMAGING GUIDED PORT INSERTION  08/20/2018   IR PERC PLEURAL DRAIN W/INDWELL CATH W/IMG GUIDE  07/08/2018   IR THORACENTESIS ASP PLEURAL SPACE W/IMG GUIDE  07/07/2018   IR US GUIDE VASC ACCESS RIGHT  08/01/2018   PLEURAL EFFUSION DRAINAGE Left 07/13/2018   Procedure: DRAINAGE OF LOCULATED PLEURAL EFFUSION;  Surgeon: Ivin Poot, MD;  Location: [facility];  Service: Thoracic;  Laterality: Left;   POLYPECTOMY  11/15/2020   Procedure: POLYPECTOMY;  Surgeon: Rogene Houston, MD;  [facility];  Service: Endoscopy;;   REMOVAL OF PLEURAL DRAINAGE CATHETER Left 08/20/2018   Procedure: REMOVAL OF PLEURAL DRAINAGE CATHETER;  Surgeon: Ivin Poot, MD;  Location: [facility];  Service: Thoracic;  Laterality: Left;   REMOVAL OF PLEURAL DRAINAGE CATHETER Left 08/20/2018   Procedure: REMOVAL OF PLEURAL DRAINAGE CATHETER;  Surgeon: Ivin Poot, MD;  Location: [facility];  Service: Thoracic;  Laterality: Left;   TALC PLEURODESIS Left 07/13/2018   Procedure: Talc Pleuradesis;  Surgeon: Prescott Gum, Collier Salina, MD;  Location: [facility];  Service: Thoracic;  Laterality: Left;   TOTAL HIP ARTHROPLASTY Left 06/05/2020   Procedure: LEFT TOTAL HIP ARTHROPLASTY ANTERIOR APPROACH;  Surgeon: Leandrew Koyanagi, MD;  Location: [facility];  Service: Orthopedics;  Laterality:  Left;  3-C   TUBAL LIGATION Bilateral    UTERINE ABLATION     VIDEO ASSISTED THORACOSCOPY Left 07/13/2018   Procedure: VIDEO ASSISTED THORACOSCOPY;  Surgeon: Ivin Poot, MD;  [facility];  Service: Thoracic;  Laterality: Left;    SOCIAL HISTORY:  Social History   Socioeconomic History   Marital status: Widowed    Spouse name: Not on file   Number of children: 2   Years of education: 2-College   Highest education level: Not on file  Occupational History    Employer: [employer]  Tobacco Use   Smoking status: Former    Packs/day: 0.50    Years: 17.00    Total pack years: 8.50    Types: Cigarettes    Quit date: 06/22/2018    Years since quitting: 3.5   Smokeless tobacco: Never  Vaping Use   Vaping Use: Never used  Substance and Sexual Activity   Alcohol use: Not Currently    Alcohol/week: 1.0 standard drink of alcohol    Types: 1 Glasses of wine per week   Drug use: No  Sexual activity: Not Currently  Other Topics Concern   Not on file  Social History Narrative   Patient lives at home with her daughter.    Patient has 2 children.    Patient is widowed.    Patient is right handed.    Patient has her Associates degree.      Social Determinants of Health   Financial Resource Strain: Low Risk  (07/22/2018)   Overall Financial Resource Strain (CARDIA)    Difficulty of Paying Living Expenses: Not hard at all  Food Insecurity: No Food Insecurity (07/22/2018)   Hunger Vital Sign    Worried About Running Out of Food in the Last Year: Never true    Ran Out of Food in the Last Year: Never true  Transportation Needs: No Transportation Needs (07/22/2018)   PRAPARE - Hydrologist (Medical): No    Lack of Transportation (Non-Medical): No  Physical Activity: Inactive (07/22/2018)   Exercise Vital Sign    Days of Exercise per Week: 0 days    Minutes of Exercise per Session: 0 min  Stress: Stress Concern Present (07/22/2018)   [place]    Feeling of Stress : Very much  Social Connections: Moderately Isolated (07/22/2018)   Social Connection and Isolation Panel [NHANES]    Frequency of Communication with Friends and Family: More than three times a week    Frequency of Social Gatherings with Friends and Family: More than three times a week    Attends Religious Services: 1 to 4 times per year    Active Member of [employer] or Organizations: No    Attends Archivist Meetings: Never    Marital Status: Widowed  Intimate Partner Violence: Not At Risk (07/22/2018)   Humiliation, Afraid, Rape, and Kick questionnaire    Fear of Current or Ex-Partner: No    Emotionally Abused: No    Physically Abused: No    Sexually Abused: No    FAMILY HISTORY:  Family History  Problem Relation Age of Onset   Depression Mother    Hypertension Mother    Obesity Mother    Diabetes Mother    Kidney disease Mother    Peripheral vascular disease Father    Atrial fibrillation Father    Crohn's disease Sister    Uterine cancer Sister 32       maternal half sister   COPD Brother    Osteoporosis Brother    Breast cancer Maternal Aunt 63   Colon cancer Neg Hx     CURRENT MEDICATIONS:  Current Outpatient Medications  Medication Sig Dispense Refill   albuterol (VENTOLIN HFA) 108 (90 Base) MCG/ACT inhaler Inhale 2 puffs into the lungs every 6 (six) hours as needed for wheezing or shortness of breath.     alum & mag hydroxide-simeth (MAALOX MAX) 400-400-40 MG/5ML suspension Swish and swallow 5 ml four times daily. 480 mL 1   B Complex-C (B-COMPLEX WITH VITAMIN C) tablet Take 1 tablet by mouth daily.     Cholecalciferol (VITAMIN D) 50 MCG (2000 UT) CAPS Take 2,000 Units by mouth daily.     dexamethasone (DECADRON) 4 MG tablet Take 1 tablet (4 mg total) by mouth daily. Three days after each treatment 30 tablet 2   diazepam (VALIUM) 5 MG tablet TAKE ONE TABLET BY MOUTH AT BEDTIME 30 tablet  5   dicyclomine (BENTYL) 10 MG capsule Take 1 capsule (10 mg total) by mouth 3 (  three) times daily as needed for spasms. 90 capsule 3   fluconazole (DIFLUCAN) 100 MG tablet Take 1 tablet (100 mg total) by mouth daily. Take 2 tablets by mouth on day 1 and then 1 tablet daily until complete 6 tablet 0   folic acid (FOLVITE) 1 MG tablet Take 1 tablet (1 mg total) by mouth daily. 30 tablet 1   gabapentin (NEURONTIN) 100 MG capsule Take 100 mg by mouth at bedtime.     guaiFENesin (MUCINEX) 600 MG 12 hr tablet Take 1 tablet (600 mg total) by mouth 2 (two) times daily. 10 tablet 0   lactulose (CHRONULAC) 10 GM/15ML solution TAKE ONE TABLESPOONFUL (15ML) EVERY 3 HOURS UNTIL BOWEL MOVEMENT ACHIEVED THEN TAKEN DAILY AS NEEDED 480 mL 6   levofloxacin (LEVAQUIN) 500 MG tablet Take 500 mg by mouth daily. Take twice daily per pt     lidocaine-prilocaine (EMLA) cream Apply 1 application  topically as needed (pain).     linaclotide (LINZESS) 290 MCG CAPS capsule Take 1 capsule (290 mcg total) by mouth daily before breakfast. 30 capsule 6   LORazepam (ATIVAN) 0.5 MG tablet Take 0.5 mg by mouth 3 (three) times daily as needed.     magic mouthwash w/lidocaine SOLN Take 5 mLs by mouth 3 (three) times daily as needed for mouth pain (Swish and spit). 480 mL 6   magnesium oxide (MAG-OX) 400 (240 Mg) MG tablet Take 1 tablet (400 mg total) by mouth 2 (two) times daily. 60 tablet 6   mirabegron ER (MYRBETRIQ) 25 MG TB24 tablet Take 1 tablet (25 mg total) by mouth daily. 28 tablet 0   olaparib (LYNPARZA) 150 MG tablet Take 2 tablets (300 mg total) by mouth 2 (two) times daily. Swallow whole. May take with food to decrease nausea and vomiting. 120 tablet 2   omeprazole (PRILOSEC) 40 MG capsule TAKE ONE CAPSULE BY MOUTH TWICE DAILY BEFORE MEALS 60 capsule 5   Oxycodone HCl 20 MG TABS TAKE ONE TABLET BY MOUTH EVERY 4 HOURS AS NEEDED 180 tablet 0   phentermine 15 MG capsule Take 15 mg by mouth daily.     predniSONE (DELTASONE)  10 MG tablet Prednisone 40 mg po daily x 1 day then Prednisone 30 mg po daily x 1 day then Prednisone 20 mg po daily x 1 day then Prednisone 10 mg daily x 1 day then stop... 10 tablet 0   prochlorperazine (COMPAZINE) 10 MG tablet Take 1 tablet (10 mg total) by mouth 2 (two) times daily as needed for nausea or vomiting. Take 30 minutes before Lynparza twice daily. 60 tablet 3   promethazine (PHENERGAN) 25 MG tablet Take 1 tablet (25 mg total) by mouth every 6 (six) hours as needed for nausea or vomiting. 60 tablet 4   sennosides-docusate sodium (SENOKOT-S) 8.6-50 MG tablet Take 1 tablet by mouth 2 (two) times daily.     SUMAtriptan (IMITREX) 100 MG tablet TAKE ONE TABLET BY MOUTH PRN UP to TWICE DAILY AS NEEDED. (Patient taking differently: Take 100 mg by mouth 2 (two) times daily as needed for migraine.) 9 tablet 5   triamcinolone cream (KENALOG) 0.1 % Apply 1 application topically 2 (two) times daily as needed (rash). 80 g 11   valACYclovir (VALTREX) 1000 MG tablet Take 1,000 mg by mouth 3 (three) times daily.     venlafaxine (EFFEXOR) 75 MG tablet Take 1 tablet (75 mg total) by mouth daily. 30 tablet 3   No current facility-administered medications for this visit.  Facility-Administered Medications Ordered in Other Visits  Medication Dose Route Frequency Provider Last Rate Last Admin   0.9 %  sodium chloride infusion   Intravenous Continuous Derek Jack, MD   Stopped at 10/23/20 1547   sodium chloride flush (NS) 0.9 % injection 10 mL  10 mL Intravenous PRN Derek Jack, MD   10 mL at 10/23/20 1544    ALLERGIES:  Allergies  Allergen Reactions   Morphine And Related Itching   Nickel Itching   Nortriptyline Other (See Comments)    Significant weight gain   Topamax [Topiramate] Diarrhea and Nausea Only   Xanax [Alprazolam]     Can't wake up    Actifed Cold-Allergy [Chlorpheniramine-Phenyleph Er] Rash    Red dye only   Amoxicillin Rash    Did it involve swelling of the  face/tongue/throat, SOB, or low BP? Unknown Did it involve sudden or severe rash/hives, skin peeling, or any reaction on the inside of your mouth or nose? Unknown Did you need to seek medical attention at a hospital or doctor's office? Unknown When did it last happen? teenager       If all above answers are "NO", may proceed with cephalosporin use.    Codeine Hives   Erythromycin Rash   Penicillins Rash    Did it involve swelling of the face/tongue/throat, SOB, or low BP? Unknown Did it involve sudden or severe rash/hives, skin peeling, or any reaction on the inside of your mouth or nose? Unknown Did you need to seek medical attention at a hospital or doctor's office? Unknown When did it last happen? teenager       If all above answers are "NO", may proceed with cephalosporin use.    Sudafed [Pseudoephedrine Hcl] Rash    Red dye only    PHYSICAL EXAM:  Performance status (ECOG): 1 - Symptomatic but completely ambulatory  Vitals:   01/09/22 1450  BP: 115/76  Pulse: 98  Resp: 17  Temp: 98.5 F (36.9 C)  SpO2: 98%    Wt Readings from Last 3 Encounters:  01/09/22 191 lb 14.4 oz (87 kg)  11/28/21 190 lb 11.2 oz (86.5 kg)  11/12/21 185 lb (83.9 kg)   Physical Exam Vitals reviewed.  Constitutional:      Appearance: Normal appearance. She is obese.  Cardiovascular:     Rate and Rhythm: Normal rate and regular rhythm.     Pulses: Normal pulses.     Heart sounds: Normal heart sounds.  Pulmonary:     Effort: Pulmonary effort is normal.     Breath sounds: Normal breath sounds.  Neurological:     General: No focal deficit present.     Mental Status: She is alert and oriented to person, place, and time.  Psychiatric:        Mood and Affect: Mood normal.        Behavior: Behavior normal.      LABORATORY DATA:  I have reviewed the labs as listed.     Latest Ref Rng & Units 01/04/2022   11:15 AM 11/21/2021    2:16 PM 11/12/2021    5:44 PM  CBC  WBC 4.0 - 10.5 K/uL 10.9   7.5  7.2   Hemoglobin 12.0 - 15.0 g/dL 12.8  12.0  11.3   Hematocrit 36.0 - 46.0 % 37.1  35.1  32.9   Platelets 150 - 400 K/uL 283  306  244       Latest Ref Rng & Units 01/04/2022   11:15  AM 11/21/2021    2:16 PM 11/12/2021    3:15 PM  CMP  Glucose 70 - 99 mg/dL 111  142  168   BUN 6 - 20 mg/dL _0 Creatinine 0.44 - 1.00 mg/dL 0.73  0.75  0.92   Sodium 135 - 145 mmol/L 136  138  136   Potassium 3.5 - 5.1 mmol/L 3.7  3.6  5.0   Chloride 98 - 111 mmol/L 101  104  103   CO2 22 - 32 mmol/L _1 Calcium 8.9 - 10.3 mg/dL 9.1  9.2  9.3   Total Protein 6.5 - 8.1 g/dL 6.8  6.9  7.0   Total Bilirubin 0.3 - 1.2 mg/dL 0.4  0.5  1.0   Alkaline Phos 38 - 126 U/L 78  98  105   AST 15 - 41 U/L 17  23  34   ALT 0 - 44 U/L 17  33  50     DIAGNOSTIC IMAGING:  I have independently reviewed the scans and discussed with the patient. CT Abdomen Pelvis W Contrast  Result Date: 01/05/2022 CLINICAL DATA:  Ovarian cancer, monitor. EXAM: CT ABDOMEN AND PELVIS WITH CONTRAST TECHNIQUE: Multidetector CT imaging of the abdomen and pelvis was performed using the standard protocol following bolus administration of intravenous contrast. RADIATION DOSE REDUCTION: This exam was performed according to the departmental dose-optimization program which includes automated exposure control, adjustment of the mA and/or kV according to patient size and/or use of iterative reconstruction technique. CONTRAST:  170m OMNIPAQUE IOHEXOL 300 MG/ML  SOLN COMPARISON:  Multiple priors including CT July 12, 2021. FINDINGS: Lower chest: Large hiatal which contains majority of the stomach and a portion of transverse colon. Hepatobiliary: No suspicious hepatic lesion. Gallbladder surgically absent. No biliary ductal dilation. Pancreas: No pancreatic ductal dilation or evidence of acute inflammation. Spleen: Similar appearance of the spleen including a heterogeneous fluid attenuation lesion measuring 6.9 cm and coil material  in the splenic artery. Adrenals/Urinary Tract: Bilateral adrenal glands appear normal. No hydronephrosis. Kidneys demonstrate symmetric enhancement. Urinary bladder is unremarkable for degree of distension. Stomach/Bowel: Radiopaque enteric contrast material traverses the ileocecal valve. No pathologic dilation of small or large bowel. The appendix and terminal ileum are within normal limits. No evidence of acute bowel inflammation. Vascular/Lymphatic: Aortic atherosclerosis. Normal caliber abdominal aorta. No pathologically enlarged abdominal or pelvic lymph nodes. Reproductive: Uterus is surgically absent. No new enhancing soft tissue nodularity along the vaginal cuff. No suspicious adnexal mass. Other: No significant abdominopelvic free fluid. No discrete peritoneal or omental nodularity. Musculoskeletal: Left total hip arthroplasty. No aggressive lytic or blastic lesion of bone. IMPRESSION: 1. Status post hysterectomy without evidence of local recurrence or abdominopelvic metastatic disease. 2. Large hiatal which contains majority of the stomach and a portion of transverse colon. 3. Similar appearance of the spleen including a heterogeneous fluid attenuation lesion measuring 6.9 cm and coil material in the splenic artery. 4.  Aortic Atherosclerosis (ICD10-I70.0). Electronically Signed   By: JDahlia BailiffM.D.   On: 01/05/2022 10:31     ASSESSMENT:  1.  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 -Robotic assisted laparoscopic TAH and BSO and omentectomy on 12/24/2018, pathology showing high-grade serous carcinoma, PT3P NX. -Germline mutation testing was negative. -3 more cycles of adjuvant chemotherapy completed on 03/15/2019. -CTAP on 04/13/2019 showed no findings of active  malignancy.  28% reduction in the volume of presumed chronic hematoma/chronic fluid collection splaying the  upper margin of the spleen.  Large type III hiatal hernia. -CTAP on 10/13/2019 shows no findings of recurrence or metastatic disease. - Foundation 1 shows HRD+, LOH score>16%.  MSI-stable.  MYC amplification.  T p53 mutation. - We reviewed CT CAP from 02/21/2021 which showed progressive peritoneal metastasis compared to 12/06/2020 scan.  No bowel obstruction.  Small right pleural effusion with mild enlargement of an isolated left external iliac lymph node. - Reviewed EGD from 02/28/2021 which showed hiatal hernia and normal findings. - She reported epigastric and left upper quadrant pain worse in the last 1 week which is related to progression of her malignancy. - 6 cycles of carboplatin and paclitaxel from 03/13/2021 through 06/18/2021 - CT CAP (07/12/2021): No evidence of recurrence or metastatic disease. - Olaparib 300 mg twice daily started on 08/22/2021.    PLAN:  1.  Clinical stage IVa high-grade serous ovarian cancer, positive cytology of left pleural effusion, HRD positive: - She is tolerating olaparib twice daily very well. - Reviewed CTAP (01/04/2022): No evidence of recurrence or metastatic disease.  Large hiatal hernia.  Similar appearance of previous splenic hemorrhage. - Ca1 25 was 5.4.  CBC and CMP were grossly normal. - She will follow-up with Dr. Jenetta Downer on 01/11/2022 regarding food getting stuck. - I have recommended continuing olaparib 300 mg twice daily.  RTC 6 weeks for follow-up with repeat labs and tumor marker.   2.  Lower back/left upper quadrant pain: - Continue oxycodone 20 mg every 4-6 hours as needed.  Pain is stable.   3.  Numbness in the feet/cramping in the hands: - At last visit she reported cramping in the hands during daytime and in the feet and legs at nighttime. - I have told her to start back on gabapentin at bedtime.  She is currently taking 200 mg at bedtime.  She cannot take gabapentin during daytime as it makes her "loopy". - Recommend increasing nighttime  dose of gabapentin in 100 mg increments.  If her dose goes to 600-900 mg and she still does not get relief, will consider Lyrica. - I am reluctant to consider Cymbalta as she is on venlafaxine which helps with her hot flashes.  4.  Iron deficiency state: - Last Feraheme on 09/27/2021. - Hemoglobin today is 12.8.  Will plan to repeat ferritin and iron panel in 6 weeks.  5.  Constipation: - Continue Linzess as needed.  6.  Hypomagnesemia: - Continue magnesium twice daily.  Magnesium is normal.   Orders placed this encounter:  Orders Placed This Encounter  Procedures   CBC with Differential/Platelet   Comprehensive metabolic panel   Magnesium   CA 125   Ferritin   Iron and TIBC       Derek Jack, MD [place] [phone]

## 2022-01-09 NOTE — Patient Instructions (Signed)
[facility] [facility]  Discharge Instructions  You were seen and examined today by Dr. Delton Coombes.  Dr. Delton Coombes discussed your most recent lab work and CT scan which revealed that everything looks good no signs of cancer.  Follow-up as scheduled in 6 weeks.    Thank you for choosing [facility] to provide your oncology and hematology care.   To afford each patient quality time with our provider, please arrive at least 15 minutes before your scheduled appointment time. You may need to reschedule your appointment if you arrive late (10 or more minutes). Arriving late affects you and other patients whose appointments are after yours.  Also, if you miss three or more appointments without notifying the office, you may be dismissed from the clinic at the provider's discretion.    Again, thank you for choosing [facility].  Our hope is that these requests will decrease the amount of time that you wait before being seen by our physicians.   If you have a lab appointment with the [facility] please come in thru the Main Entrance and check in at the main information desk.           _____________________________________________________________  Should you have questions after your visit to [facility], please contact our office at [phone] and follow the prompts.  Our office hours are 8:00 a.m. to 4:30 p.m. Monday - Thursday and 8:00 a.m. to 2:30 p.m. Friday.  Please note that voicemails left after 4:00 p.m. may not be returned until the following business day.  We are closed weekends and all major holidays.  You do have access to a nurse 24-7, just call the main number to the clinic [phone] and do not press any options, hold on the line and a nurse will answer the phone.    For prescription refill requests, have your pharmacy contact our office and allow 72 hours.    Masks are optional in the cancer centers. If you  would like for your care team to wear a mask while they are taking care of you, please let them know. You may have one support person who is at least 52 years old accompany you for your appointments.

## 2022-01-10 ENCOUNTER — Ambulatory Visit (INDEPENDENT_AMBULATORY_CARE_PROVIDER_SITE_OTHER): Payer: Medicare Other | Admitting: Psychiatry

## 2022-01-10 DIAGNOSIS — F321 Major depressive disorder, single episode, moderate: Secondary | ICD-10-CM
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
## 2022-01-11 ENCOUNTER — Other Ambulatory Visit: Payer: Self-pay

## 2022-01-11 ENCOUNTER — Other Ambulatory Visit (HOSPITAL_COMMUNITY): Payer: Self-pay

## 2022-01-11 DIAGNOSIS — R232 Flushing: Secondary | ICD-10-CM

## 2022-01-11 MED ORDER — VENLAFAXINE HCL 75 MG PO TABS: 75.0000 mg | ORAL_TABLET | Freq: Every day | ORAL | 3 refills | Status: DC

## 2022-01-24 ENCOUNTER — Ambulatory Visit (INDEPENDENT_AMBULATORY_CARE_PROVIDER_SITE_OTHER): Payer: 59 | Admitting: Psychiatry

## 2022-01-24 ENCOUNTER — Encounter (HOSPITAL_COMMUNITY): Payer: Self-pay | Admitting: Hematology

## 2022-01-24 DIAGNOSIS — F321 Major depressive disorder, single episode, moderate: Secondary | ICD-10-CM
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
## 2022-01-28 ENCOUNTER — Other Ambulatory Visit (HOSPITAL_COMMUNITY): Payer: Self-pay

## 2022-01-28 ENCOUNTER — Other Ambulatory Visit (HOSPITAL_COMMUNITY): Payer: Self-pay | Admitting: Hematology

## 2022-01-29 ENCOUNTER — Other Ambulatory Visit (HOSPITAL_COMMUNITY): Payer: Self-pay

## 2022-01-30 ENCOUNTER — Other Ambulatory Visit (HOSPITAL_COMMUNITY): Payer: Self-pay

## 2022-01-31 ENCOUNTER — Other Ambulatory Visit: Payer: Self-pay | Admitting: *Deleted

## 2022-01-31 MED ORDER — OXYCODONE HCL 20 MG PO TABS: 1.0000 | ORAL_TABLET | ORAL | 0 refills | Status: DC | PRN

## 2022-02-01 ENCOUNTER — Other Ambulatory Visit: Payer: Self-pay | Admitting: Hematology

## 2022-02-01 ENCOUNTER — Other Ambulatory Visit: Payer: Self-pay | Admitting: *Deleted

## 2022-02-01 ENCOUNTER — Other Ambulatory Visit (HOSPITAL_COMMUNITY): Payer: Self-pay

## 2022-02-01 MED ORDER — OLAPARIB 150 MG PO TABS
300.0000 mg | ORAL_TABLET | Freq: Two times a day (BID) | ORAL | 2 refills | Status: DC
  Filled 2022-02-01: qty 120, 30d supply, fill #0
  Filled 2022-03-05: qty 120, 30d supply, fill #1
  Filled 2022-04-02: qty 120, 30d supply, fill #2

## 2022-02-01 NOTE — Telephone Encounter (Signed)
[facility] refill approved.  Patient is tolerating and is to continue on therapy.

## 2022-02-04 DIAGNOSIS — T451X5A Adverse effect of antineoplastic and immunosuppressive drugs, initial encounter: Secondary | ICD-10-CM | POA: Diagnosis not present

## 2022-02-04 DIAGNOSIS — Z299 Encounter for prophylactic measures, unspecified: Secondary | ICD-10-CM | POA: Diagnosis not present

## 2022-02-04 DIAGNOSIS — D849 Immunodeficiency, unspecified: Secondary | ICD-10-CM | POA: Diagnosis not present

## 2022-02-04 DIAGNOSIS — G620 Drug-induced polyneuropathy: Secondary | ICD-10-CM | POA: Diagnosis not present

## 2022-02-06 ENCOUNTER — Other Ambulatory Visit: Payer: Self-pay

## 2022-02-11 ENCOUNTER — Encounter (INDEPENDENT_AMBULATORY_CARE_PROVIDER_SITE_OTHER): Payer: Self-pay | Admitting: Gastroenterology

## 2022-02-11 ENCOUNTER — Encounter (HOSPITAL_COMMUNITY): Payer: Self-pay | Admitting: Hematology

## 2022-02-11 ENCOUNTER — Ambulatory Visit (INDEPENDENT_AMBULATORY_CARE_PROVIDER_SITE_OTHER): Payer: 59 | Admitting: Gastroenterology

## 2022-02-11 VITALS — BP 106/60 | HR 98 | Temp 97.8°F | Ht 64.0 in | Wt 191.4 lb

## 2022-02-11 DIAGNOSIS — K219 Gastro-esophageal reflux disease without esophagitis: Secondary | ICD-10-CM | POA: Diagnosis not present

## 2022-02-11 DIAGNOSIS — K449 Diaphragmatic hernia without obstruction or gangrene: Secondary | ICD-10-CM

## 2022-02-11 DIAGNOSIS — R112 Nausea with vomiting, unspecified: Secondary | ICD-10-CM

## 2022-02-11 NOTE — Progress Notes (Signed)
Maylon Peppers, M.D. Gastroenterology & Hepatology [facility] [address] [place], [place] [address]  Primary Care Physician: [name], MD [place] [id number]  I will communicate my assessment and recommendations to the referring MD via EMR.  Problems: Recurrent dysphagia Nausea with vomiting. Iron deficiency anemia, possibly related to come around erosions  History of Present Illness: Michelle Holt is a 52 y.o. female with past medical history of iron deficiency anemia, GERD, IBS, ovarian carcinoma status post total abdominal hysterectomy and bilateral salpingo-oophorectomy and chemotherapy, hyperlipidemia, who presents for follow up of dysphagia and nausea.  The patient was last seen on 02/20/2021 (Dr. Laural Golden). At that time, the patient was advised to decrease omeprazole to 20 mg every day and to schedule an EGD.  Patient reports that she was supposed to have a hiatal hernia repair but she had to postpone it as her ovarian cancer recurred. She is currently on remission.  States in October she had pneumonia and had to receive Levaquin for management of the infection. Since then her dysphagia has been worse, as she has had frequent regurgitation.  She states it is especially difficult with solid food, not that much with liquids. States for the last 2 months  she has had recurrent nausea - occasional has to vomit after having a meal. She has not lost any weight. She reports having significant pain in the epigastric area after having a meal, followed by the episode of nausea. She takes Phenergan or Zofran as needed and lays down when she has nausea to avoid vomiting episodes.  Patient reports that she does not have heartburn  as she takes PPI BID.  She is taking styool softeners x2 daily and that makes her move her bowels adequately.  The patient denies having any nausea, vomiting, fever, chills, hematochezia, melena,  hematemesis, abdominal distention, abdominal pain, diarrhea, jaundice, pruritus or weight loss.  Most recent labs from 11/21/2021 showed iron of 178, saturation of 50%, ferritin 233, labs from 01/04/2022 showed a hemoglobin of 12.8.  Her most recent CT of the abdomen pelvis with IV contrast showed presence of large hiatal hernia containing majority of the stomach and a portion of the transverse colon.  Last EGD: 12/28/2021, Schatzki's ring at the GE junction dilated with a balloon up to 18 mm, 8 cm hiatal hernia, normal stomach and duodenum. Last Colonoscopy:11/15/2020, normal terminal ileum, 2 polyps were found in the ascending colon and cecum measuring 3 to 5 mm (tubular adenomas x 2), external hemorrhoids.  Recommended repeat colonoscopy in 5 years  Patient had a capsule endoscopy on 11/24/2020 which showed punctate AVM in the duodenum without any other abnormality.  Past Medical History: Past Medical History:  Diagnosis Date   Anemia    Anxiety and depression    Arthritis of facet joints at multiple vertebral levels    L5-S1   Constipation    Dyslipidemia    Family history of breast cancer    Family history of uterine cancer    GERD (gastroesophageal reflux disease)    History of hiatal hernia    History of kidney stones    Insomnia    Irritable bowel syndrome    Migraine    Muscle tension headache    Neuropathy of finger    Ovarian carcinoma (HCC)    ovarian   Plantar fasciitis of right foot    Port-A-Cath in place 08/20/2018    Past Surgical History: Past Surgical History:  Procedure Laterality Date   ABDOMINAL HYSTERECTOMY     BIOPSY  10/27/2020   Procedure: BIOPSY;  Surgeon: Harvel Quale, MD;  [facility];  Service: Gastroenterology;;   CHOLECYSTECTOMY  2008   COLONOSCOPY N/A 08/13/2013   Procedure: COLONOSCOPY;  Surgeon: Rogene Houston, MD;  [facility];  Service: Endoscopy;  Laterality: N/A;  230-moved to 145 Ann to notify pt    COLONOSCOPY WITH PROPOFOL N/A 11/15/2020   Procedure: COLONOSCOPY WITH PROPOFOL;  Surgeon: Rogene Houston, MD;  [facility];  Service: Endoscopy;  Laterality: N/A;  1:40   ESOPHAGEAL DILATION N/A 02/28/2021   Procedure: ESOPHAGEAL DILATION;  Surgeon: Rogene Houston, MD;  [facility];  Service: Endoscopy;  Laterality: N/A;   ESOPHAGOGASTRODUODENOSCOPY     ESOPHAGOGASTRODUODENOSCOPY (EGD) WITH PROPOFOL N/A 07/20/2018   Procedure: ESOPHAGOGASTRODUODENOSCOPY (EGD) WITH PROPOFOL;  Surgeon: Rogene Houston, MD;  [facility];  Service: Endoscopy;  Laterality: N/A;  Possible esophageal dilation.   ESOPHAGOGASTRODUODENOSCOPY (EGD) WITH PROPOFOL N/A 10/27/2020   Procedure: ESOPHAGOGASTRODUODENOSCOPY (EGD) WITH PROPOFOL;  Surgeon: Harvel Quale, MD;  [facility];  Service: Gastroenterology;  Laterality: N/A;  2:10, pt knows to arrive at 10:15   ESOPHAGOGASTRODUODENOSCOPY (EGD) WITH PROPOFOL N/A 02/28/2021   Procedure: ESOPHAGOGASTRODUODENOSCOPY (EGD) WITH PROPOFOL;  Surgeon: Rogene Houston, MD;  [facility];  Service: Endoscopy;  Laterality: N/A;  200 ASA 1   GIVENS CAPSULE STUDY N/A 11/24/2020   Procedure: GIVENS CAPSULE STUDY;  Surgeon: Rogene Houston, MD;  [facility];  Service: Endoscopy;  Laterality: N/A;  7:30   IR ANGIOGRAM SELECTIVE EACH ADDITIONAL VESSEL  08/01/2018   IR ANGIOGRAM VISCERAL SELECTIVE  08/01/2018   IR EMBO ART  VEN HEMORR LYMPH EXTRAV  INC GUIDE ROADMAPPING  08/01/2018   IR IMAGING GUIDED PORT INSERTION  08/20/2018   IR PERC PLEURAL DRAIN W/INDWELL CATH W/IMG GUIDE  07/08/2018   IR THORACENTESIS ASP PLEURAL SPACE W/IMG GUIDE  07/07/2018   IR US GUIDE VASC ACCESS RIGHT  08/01/2018   PLEURAL EFFUSION DRAINAGE Left 07/13/2018   Procedure: DRAINAGE OF LOCULATED PLEURAL EFFUSION;  Surgeon: Ivin Poot, MD;  Location: [facility];  Service: Thoracic;  Laterality: Left;   POLYPECTOMY  11/15/2020   Procedure:  POLYPECTOMY;  Surgeon: Rogene Houston, MD;  [facility];  Service: Endoscopy;;   REMOVAL OF PLEURAL DRAINAGE CATHETER Left 08/20/2018   Procedure: REMOVAL OF PLEURAL DRAINAGE CATHETER;  Surgeon: Ivin Poot, MD;  Location: [facility];  Service: Thoracic;  Laterality: Left;   REMOVAL OF PLEURAL DRAINAGE CATHETER Left 08/20/2018   Procedure: REMOVAL OF PLEURAL DRAINAGE CATHETER;  Surgeon: Ivin Poot, MD;  Location: [facility];  Service: Thoracic;  Laterality: Left;   TALC PLEURODESIS Left 07/13/2018   Procedure: Talc Pleuradesis;  Surgeon: Prescott Gum, Collier Salina, MD;  Location: [facility];  Service: Thoracic;  Laterality: Left;   TOTAL HIP ARTHROPLASTY Left 06/05/2020   Procedure: LEFT TOTAL HIP ARTHROPLASTY ANTERIOR APPROACH;  Surgeon: Leandrew Koyanagi, MD;  Location: [facility];  Service: Orthopedics;  Laterality: Left;  3-C   TUBAL LIGATION Bilateral    UTERINE ABLATION     VIDEO ASSISTED THORACOSCOPY Left 07/13/2018   Procedure: VIDEO ASSISTED THORACOSCOPY;  Surgeon: Ivin Poot, MD;  Location: [facility];  Service: Thoracic;  Laterality: Left;    Family History: Family History  Problem Relation Age of Onset   Depression Mother  Hypertension Mother    Obesity Mother    Diabetes Mother    Kidney disease Mother    Peripheral vascular disease Father    Atrial fibrillation Father    Crohn's disease Sister    Uterine cancer Sister 61       maternal half sister   COPD Brother    Osteoporosis Brother    Breast cancer Maternal Aunt 79   Colon cancer Neg Hx     Social History: Social History   Tobacco Use  Smoking Status Former   Packs/day: 0.50   Years: 17.00   Total pack years: 8.50   Types: Cigarettes   Quit date: 06/22/2018   Years since quitting: 3.6  Smokeless Tobacco Never   Social History   Substance and Sexual Activity  Alcohol Use Yes   Alcohol/week: 1.0 standard drink of alcohol   Types: 1 Glasses of wine per week   Comment: occasionally   Social History   Substance  and Sexual Activity  Drug Use No    Allergies: Allergies  Allergen Reactions   Morphine And Related Itching   Nickel Itching   Nortriptyline Other (See Comments)    Significant weight gain   Topamax [Topiramate] Diarrhea and Nausea Only   Xanax [Alprazolam]     Can't wake up    Actifed Cold-Allergy [Chlorpheniramine-Phenyleph Er] Rash    Red dye only   Amoxicillin Rash    Did it involve swelling of the face/tongue/throat, SOB, or low BP? Unknown Did it involve sudden or severe rash/hives, skin peeling, or any reaction on the inside of your mouth or nose? Unknown Did you need to seek medical attention at a hospital or doctor's office? Unknown When did it last happen? teenager       If all above answers are "NO", may proceed with cephalosporin use.    Codeine Hives   Erythromycin Rash   Penicillins Rash    Did it involve swelling of the face/tongue/throat, SOB, or low BP? Unknown Did it involve sudden or severe rash/hives, skin peeling, or any reaction on the inside of your mouth or nose? Unknown Did you need to seek medical attention at a hospital or doctor's office? Unknown When did it last happen? teenager       If all above answers are "NO", may proceed with cephalosporin use.    Sudafed [Pseudoephedrine Hcl] Rash    Red dye only    Medications: Current Outpatient Medications  Medication Sig Dispense Refill   albuterol (VENTOLIN HFA) 108 (90 Base) MCG/ACT inhaler Inhale 2 puffs into the lungs every 6 (six) hours as needed for wheezing or shortness of breath.     B Complex-C (B-COMPLEX WITH VITAMIN C) tablet Take 1 tablet by mouth daily.     Cholecalciferol (VITAMIN D) 50 MCG (2000 UT) CAPS Take 2,000 Units by mouth daily.     diazepam (VALIUM) 5 MG tablet TAKE ONE TABLET BY MOUTH AT BEDTIME 30 tablet 5   dicyclomine (BENTYL) 10 MG capsule Take 1 capsule (10 mg total) by mouth 3 (three) times daily as needed for spasms. 90 capsule 3   folic acid (FOLVITE) 1 MG tablet Take  1 tablet (1 mg total) by mouth daily. 30 tablet 1   gabapentin (NEURONTIN) 100 MG capsule Take 100 mg by mouth at bedtime.     lidocaine-prilocaine (EMLA) cream Apply 1 application  topically as needed (pain).     LORazepam (ATIVAN) 0.5 MG tablet Take 0.5 mg by mouth 3 (three)  times daily as needed.     magic mouthwash w/lidocaine SOLN Take 5 mLs by mouth 3 (three) times daily as needed for mouth pain (Swish and spit). 480 mL 6   magnesium oxide (MAG-OX) 400 (240 Mg) MG tablet Take 1 tablet (400 mg total) by mouth 2 (two) times daily. 60 tablet 6   olaparib (LYNPARZA) 150 MG tablet Take 2 tablets (300 mg total) by mouth 2 (two) times daily. Swallow whole. May take with food to decrease nausea and vomiting. 120 tablet 2   omeprazole (PRILOSEC) 40 MG capsule TAKE ONE CAPSULE BY MOUTH TWICE DAILY BEFORE MEALS 60 capsule 5   ondansetron (ZOFRAN) 4 MG tablet TAKE ONE TABLET BY MOUTH EVERY 8 HOURS AS NEEDED FOR NAUSEA OR VOMITING 40 tablet 3   Oxycodone HCl 20 MG TABS Take 1 tablet (20 mg total) by mouth every 4 (four) hours as needed. 180 tablet 0   phentermine 15 MG capsule Take 15 mg by mouth daily.     prochlorperazine (COMPAZINE) 10 MG tablet Take 1 tablet (10 mg total) by mouth 2 (two) times daily as needed for nausea or vomiting. Take 30 minutes before Lynparza twice daily. 60 tablet 3   promethazine (PHENERGAN) 25 MG tablet Take 1 tablet (25 mg total) by mouth every 6 (six) hours as needed for nausea or vomiting. 60 tablet 4   sennosides-docusate sodium (SENOKOT-S) 8.6-50 MG tablet Take 1 tablet by mouth 2 (two) times daily.     SUMAtriptan (IMITREX) 100 MG tablet TAKE ONE TABLET BY MOUTH PRN UP to TWICE DAILY AS NEEDED. (Patient taking differently: Take 100 mg by mouth 2 (two) times daily as needed for migraine.) 9 tablet 5   triamcinolone cream (KENALOG) 0.1 % Apply 1 application topically 2 (two) times daily as needed (rash). 80 g 11   venlafaxine (EFFEXOR) 75 MG tablet Take 1 tablet (75 mg  total) by mouth daily. 30 tablet 3   linaclotide (LINZESS) 290 MCG CAPS capsule Take 1 capsule (290 mcg total) by mouth daily before breakfast. (Patient not taking: Reported on 02/11/2022) 30 capsule 6   No current facility-administered medications for this visit.   Facility-Administered Medications Ordered in Other Visits  Medication Dose Route Frequency Provider Last Rate Last Admin   0.9 %  sodium chloride infusion   Intravenous Continuous Derek Jack, MD   Stopped at 10/23/20 1547   sodium chloride flush (NS) 0.9 % injection 10 mL  10 mL Intravenous PRN Derek Jack, MD   10 mL at 10/23/20 1544    Review of Systems: GENERAL: negative for malaise, night sweats HEENT: No changes in hearing or vision, no nose bleeds or other nasal problems. NECK: Negative for lumps, goiter, pain and significant neck swelling RESPIRATORY: Negative for cough, wheezing CARDIOVASCULAR: Negative for chest pain, leg swelling, palpitations, orthopnea GI: SEE HPI MUSCULOSKELETAL: Negative for joint pain or swelling, back pain, and muscle pain. SKIN: Negative for lesions, rash PSYCH: Negative for sleep disturbance, mood disorder and recent psychosocial stressors. HEMATOLOGY Negative for prolonged bleeding, bruising easily, and swollen nodes. ENDOCRINE: Negative for cold or heat intolerance, polyuria, polydipsia and goiter. NEURO: negative for tremor, gait imbalance, syncope and seizures. The remainder of the review of systems is noncontributory.   Physical Exam: BP 106/60 (BP Location: Left Arm, Patient Position: Sitting, Cuff Size: Large)   Pulse 98   Temp 97.8 F (36.6 C) (Oral)   Ht '5\' 4"'$  (1.626 m)   Wt 191 lb 6.4 oz (86.8 kg)  LMP  (LMP Unknown)   BMI 32.85 kg/m  GENERAL: The patient is AO x3, in no acute distress. HEENT: Head is normocephalic and atraumatic. EOMI are intact. Mouth is well hydrated and without lesions. NECK: Supple. No masses LUNGS: Clear to auscultation. No  presence of rhonchi/wheezing/rales. Adequate chest expansion HEART: RRR, normal s1 and s2. ABDOMEN: tender to palpation in the epigastric area, no guarding, no peritoneal signs, and nondistended. BS +. No masses. EXTREMITIES: Without any cyanosis, clubbing, rash, lesions or edema. NEUROLOGIC: AOx3, no focal motor deficit. SKIN: no jaundice, no rashes  Imaging/Labs: as above  I personally reviewed and interpreted the available labs, imaging and endoscopic files.  Impression and Plan: AYERIM Holt is a 52 y.o. female with past medical history of iron deficiency anemia, GERD, IBS, ovarian carcinoma status post total abdominal hysterectomy and bilateral salpingo-oophorectomy and chemotherapy, hyperlipidemia, who presents for follow up of dysphagia and nausea.  The patient has presented recurrent episodes dysphagia.  She has had endoscopic evaluations in the past that have shown presence of a large hiatal hernia which is likely leading to her current symptoms.  She is interested in undergoing an evaluation by a surgeon for this, which I consider adequate given the presence of large herniation with evidence of colon and gastric contents herniated into the hernia sac.  Will refer her for evaluation by thoracic surgery at [facility] as the patient prefers to be evaluated at this location.  She can continue taking Zofran and Phenergan as needed for her nausea, as well as continue to implement the lifestyle modifications she has done so far.  - Referral for evaluation by thoracic surgery at [facility] with Zofran and Phenergan as needed for nausea.  All questions were answered.      Maylon Peppers, MD Gastroenterology and Hepatology [facility]

## 2022-02-11 NOTE — Patient Instructions (Signed)
Referral for evaluation by thoracic surgery at [facility] with Zofran and Phenergan as needed for nausea.

## 2022-02-14 ENCOUNTER — Inpatient Hospital Stay: Payer: 59 | Attending: Hematology

## 2022-02-14 DIAGNOSIS — R1012 Left upper quadrant pain: Secondary | ICD-10-CM | POA: Diagnosis not present

## 2022-02-14 DIAGNOSIS — Z9221 Personal history of antineoplastic chemotherapy: Secondary | ICD-10-CM | POA: Insufficient documentation

## 2022-02-14 DIAGNOSIS — C563 Malignant neoplasm of bilateral ovaries: Secondary | ICD-10-CM | POA: Insufficient documentation

## 2022-02-14 DIAGNOSIS — Z87891 Personal history of nicotine dependence: Secondary | ICD-10-CM | POA: Diagnosis not present

## 2022-02-14 DIAGNOSIS — D509 Iron deficiency anemia, unspecified: Secondary | ICD-10-CM | POA: Insufficient documentation

## 2022-02-14 DIAGNOSIS — Z79899 Other long term (current) drug therapy: Secondary | ICD-10-CM | POA: Diagnosis not present

## 2022-02-14 DIAGNOSIS — K5900 Constipation, unspecified: Secondary | ICD-10-CM | POA: Insufficient documentation

## 2022-02-14 LAB — COMPREHENSIVE METABOLIC PANEL
ALT: 17 U/L (ref 0–44)
AST: 28 U/L (ref 15–41)
Albumin: 3.6 g/dL (ref 3.5–5.0)
Alkaline Phosphatase: 87 U/L (ref 38–126)
Anion gap: 9 (ref 5–15)
BUN: 15 mg/dL (ref 6–20)
CO2: 26 mmol/L (ref 22–32)
Calcium: 9.1 mg/dL (ref 8.9–10.3)
Chloride: 99 mmol/L (ref 98–111)
Creatinine, Ser: 0.72 mg/dL (ref 0.44–1.00)
GFR, Estimated: >60 mL/min (ref >60)
Glucose, Bld: 143 mg/dL — ABNORMAL HIGH (ref 70–99)
Potassium: 3.5 mmol/L (ref 3.5–5.1)
Sodium: 134 mmol/L — ABNORMAL LOW (ref 135–145)
Total Bilirubin: 0.3 mg/dL (ref 0.3–1.2)
Total Protein: 6.7 g/dL (ref 6.5–8.1)

## 2022-02-14 LAB — IRON AND TIBC
Iron: 119 ug/dL (ref 28–170)
Saturation Ratios: 34 % — ABNORMAL HIGH (ref 10.4–31.8)
TIBC: 355 ug/dL (ref 250–450)
UIBC: 236 ug/dL

## 2022-02-14 LAB — CBC WITH DIFFERENTIAL/PLATELET
Abs Immature Granulocytes: 0.03 10*3/uL (ref 0.00–0.07)
Basophils Absolute: 0 10*3/uL (ref 0.0–0.1)
Basophils Relative: 1 %
Eosinophils Absolute: 0.2 10*3/uL (ref 0.0–0.5)
Eosinophils Relative: 4 %
HCT: 38.1 % (ref 36.0–46.0)
Hemoglobin: 13 g/dL (ref 12.0–15.0)
Immature Granulocytes: 1 %
Lymphocytes Relative: 30 %
Lymphs Abs: 1.9 10*3/uL (ref 0.7–4.0)
MCH: 40.8 pg — ABNORMAL HIGH (ref 26.0–34.0)
MCHC: 34.1 g/dL (ref 30.0–36.0)
MCV: 119.4 fL — ABNORMAL HIGH (ref 80.0–100.0)
Monocytes Absolute: 0.4 10*3/uL (ref 0.1–1.0)
Monocytes Relative: 6 %
Neutro Abs: 4 10*3/uL (ref 1.7–7.7)
Neutrophils Relative %: 58 %
Platelets: 283 10*3/uL (ref 150–400)
RBC: 3.19 MIL/uL — ABNORMAL LOW (ref 3.87–5.11)
RDW: 13.6 % (ref 11.5–15.5)
WBC: 6.6 10*3/uL (ref 4.0–10.5)
nRBC: 0 % (ref 0.0–0.2)

## 2022-02-14 LAB — MAGNESIUM: Magnesium: 1.8 mg/dL (ref 1.7–2.4)

## 2022-02-14 LAB — FERRITIN: Ferritin: 135 ng/mL (ref 11–307)

## 2022-02-14 MED ORDER — SODIUM CHLORIDE 0.9% FLUSH
10.0000 mL | Freq: Once | INTRAVENOUS | Status: AC
  Administered 2022-02-14: 10 mL via INTRAVENOUS

## 2022-02-14 MED ORDER — HEPARIN SOD (PORK) LOCK FLUSH 100 UNIT/ML IV SOLN
500.0000 [IU] | Freq: Once | INTRAVENOUS | Status: AC
  Administered 2022-02-14: 500 [IU] via INTRAVENOUS

## 2022-02-14 NOTE — Progress Notes (Signed)
Patients port flushed without difficulty.  Good blood return noted with no bruising or swelling noted at site.  Band aid applied.  VSS with discharge and left in satisfactory condition with no s/s of distress noted.   

## 2022-02-16 LAB — CA 125: Cancer Antigen (CA) 125: 6 U/mL (ref 0.0–38.1)

## 2022-02-18 ENCOUNTER — Other Ambulatory Visit: Payer: Self-pay | Admitting: *Deleted

## 2022-02-18 MED ORDER — GABAPENTIN 100 MG PO CAPS: 100.0000 mg | ORAL_CAPSULE | Freq: Every day | ORAL | 0 refills | Status: DC

## 2022-02-20 ENCOUNTER — Ambulatory Visit (HOSPITAL_COMMUNITY): Payer: Medicare Other | Admitting: Psychiatry

## 2022-02-20 ENCOUNTER — Inpatient Hospital Stay (HOSPITAL_BASED_OUTPATIENT_CLINIC_OR_DEPARTMENT_OTHER): Payer: 59 | Admitting: Hematology

## 2022-02-20 DIAGNOSIS — D649 Anemia, unspecified: Secondary | ICD-10-CM | POA: Diagnosis not present

## 2022-02-20 DIAGNOSIS — C563 Malignant neoplasm of bilateral ovaries: Secondary | ICD-10-CM

## 2022-02-20 DIAGNOSIS — D509 Iron deficiency anemia, unspecified: Secondary | ICD-10-CM | POA: Diagnosis not present

## 2022-02-20 DIAGNOSIS — R1012 Left upper quadrant pain: Secondary | ICD-10-CM | POA: Diagnosis not present

## 2022-02-20 DIAGNOSIS — K5900 Constipation, unspecified: Secondary | ICD-10-CM | POA: Diagnosis not present

## 2022-02-20 DIAGNOSIS — Z79899 Other long term (current) drug therapy: Secondary | ICD-10-CM | POA: Diagnosis not present

## 2022-02-20 DIAGNOSIS — Z9221 Personal history of antineoplastic chemotherapy: Secondary | ICD-10-CM | POA: Diagnosis not present

## 2022-02-20 DIAGNOSIS — Z87891 Personal history of nicotine dependence: Secondary | ICD-10-CM | POA: Diagnosis not present

## 2022-02-20 NOTE — Progress Notes (Signed)
[place], [place] [address]   CLINIC:  Medical Oncology/Hematology  PCP:  [name], MD [address] / [place] [address] [phone]   REASON FOR VISIT:  Follow-up for high-grade serous ovarian cancer  PRIOR THERAPY:  1. Carboplatin and paclitaxel x 7 cycles from 08/24/2018 to 03/15/2019. 2. Laparoscopic TAH & BSO & omenectomy on 12/24/2018. 3.  6 cycles of carboplatin and paclitaxel from 03/05/2021 to 06/18/2021  NGS Results: not done  CURRENT THERAPY: olaparib  BRIEF ONCOLOGIC HISTORY:  Oncology History  Ovarian cancer, bilateral ([place])  07/07/2018 Pathology Results   PLEURAL FLUID, LEFT (SPECIMEN 1 OF 1, COLLECTED 07/07/18): - MALIGNANT CELLS CONSISTENT WITH ADENOCARCINOMA - SEE COMMENT  Source Pleural Fluid, (Specimen 1 of 1, collected on 07/07/2018) Gross Specimen: Received is/are 1000cc of bloody red fluid with tissue. (TC:tc) Prepared: # Smears: 0 # Concentration Technique Slides (i.e. ThinPrep): 1 # Cell Block: 1 Conventional Additional Studies: Two Hematology slides labeled [id number] Comment The malignant cells are positive for cytokeratin 7, p53, WT-1, Pax-8, Moc31, ER (weak) and EMA but negative for cytokeratin 20, TTF-1, GATA-3, CDX-2 and D2-40. Overall, the phenotype is consistent with a gynecologic primary; clinical correlation recommended.   07/07/2018 Procedure   Successful ultrasound guided left thoracentesis yielding 2.0 L of pleural fluid   07/08/2018 Procedure   1. Technically successful placement of left 14 French pigtail chest drain, placed to Pleur-evac water-seal.   07/08/2018 Procedure   1. Technically successful five [place] double lumen power injectable PICC placement   07/09/2018 Imaging   Ct chest 1. There is a moderate, loculated left hydropneumothorax with a small air component and moderate fluid component. The largest loculated component is located posteriorly. There is a pigtail drainage catheter  about the lateral pleural space. There is no obvious etiology, such as obvious mass or pleural disease.   2. There is a small right pleural effusion with associated atelectasis or consolidation and a subpleural consolidation of the superior segment right lower lobe (series 4, image 56), of uncertain significance, possibly infectious or inflammatory   07/10/2018 Imaging   Ct abdomen and pelvis: 1. The bilateral ovaries are enlarged by heterogeneous appearing cystic lesions, measuring 5.3 x 4.2 cm on the right (series 4, image 72) and 4.5 x 3.2 cm on the left (series 4, image 75). Consider dedicated pelvic ultrasound and/or pelvic MRI to further evaluate for solid components given high suspicion for GYN primary malignancy.   2. No other evidence of mass and no lymphadenopathy in the abdomen or pelvis.   3. Trace ascites. There is some suggestion of omental and peritoneal nodularity (e.g. Series 4, image 28), concerning for peritoneal metastatic disease.    4. Loculated left-sided pleural effusion with left-sided pleural drainage catheter in position. Small right pleural effusion   07/13/2018 Surgery   OPERATION: 1.  Left VATS (video-assisted thoracoscopic surgery) for drainage of loculated pleural effusion. 2.  Talc pleurodesis for malignant pleural effusion. 3.  Placement of PleurX catheter for management of malignant pleural effusion. 4.  Placement of On-Q analgesia catheter system.    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   07/13/2018 Pathology Results   Pleura, peel, Left Pleural - FIBRO-FIBRINOUS PLEURITIS - NEGATIVE FOR MALIGNANCY   07/18/2018 Initial Diagnosis   Ovarian cancer, bilateral ([facility])   07/20/2018 Procedure  EGD impression: Normal proximal esophagus and mid esophagus. Mild distal  esophageal rings; dilation not performed because of esophagitis. LA Grade C reflux esophagitis. Z-line regular, 30 cm from the incisors. 5 cm hiatal hernia. Non-bleeding gastric ulcer with no stigmata of bleeding. Gastritis. Duodenal erosions without bleeding. Normal second portion of the duodenum. No specimens collected.   08/19/2018 Genetic Testing   Negative genetic testing on the common hereditary cancer panel.  The Common Hereditary Gene Panel offered by [facility] includes sequencing and/or deletion duplication testing of the following 48 genes: APC, ATM, AXIN2, BARD1, BMPR1A, BRCA1, BRCA2, BRIP1, CDH1, CDK4, CDKN2A (p14ARF), CDKN2A (p16INK4a), CHEK2, CTNNA1, DICER1, EPCAM (Deletion/duplication testing only), GREM1 (promoter region deletion/duplication testing only), KIT, MEN1, MLH1, MSH2, MSH3, MSH6, MUTYH, NBN, NF1, NHTL1, PALB2, PDGFRA, PMS2, POLD1, POLE, PTEN, RAD50, RAD51C, RAD51D, RNF43, SDHB, SDHC, SDHD, SMAD4, SMARCA4. STK11, TP53, TSC1, TSC2, and VHL.  The following genes were evaluated for sequence changes only: SDHA and HOXB13 c.251G>A variant only. The report date is August 19, 2018.   08/24/2018 - 03/15/2019 Chemotherapy   Patient is on Treatment Plan : OVARIAN Carboplatin (AUC 6) / Paclitaxel (175) q21d x 6 cycles     03/05/2021 - 06/18/2021 Chemotherapy   Patient is on Treatment Plan : OVARIAN Carboplatin (AUC 6) / Paclitaxel (175) q21d x 6 cycles       CANCER STAGING:  Cancer Staging  No matching staging information was found for the patient.  INTERVAL HISTORY:  [name], a 52 y.o. female, seen for follow-up of high-grade serous ovarian carcinoma.  She is tolerating olaparib 300 mg twice daily very well. She was last seen by me on 01/09/22.  Today, she states that she is doing poorly and has been tired. Her appetite level is at 70%. Her energy level is at 70%. She has been nauseated with eating and often vomits after eating. She reports some LLQ abdominal cramping  intermittently for quite a while. The pain does not occur every day. Her pain waxes and wanes in severity, and she takes an Oxycodone if her pain is severe. She denies any bowel movement changes.  She tried to increase her Gabapentin at bedtime but felt too drowsy/loopy the next morning.  She states that she saw GI Dr. Maylon Peppers [place] on 02/11/22. He referred her to thoracic surgery at [facility] to further discuss her options for hernia repair. She is scheduled for this visit on 03/01/22.  She reports a productive cough only in the mornings.   REVIEW OF SYSTEMS:  Review of Systems  Constitutional:  Negative for chills, fatigue and fever.  HENT:   Positive for trouble swallowing. Negative for lump/mass, mouth sores, nosebleeds and sore throat.   Eyes:  Negative for eye problems.  Respiratory:  Positive for cough.   Cardiovascular:  Negative for chest pain, leg swelling and palpitations.  Gastrointestinal:  Positive for abdominal pain (cramps), nausea and vomiting. Negative for constipation and diarrhea.  Genitourinary:  Negative for bladder incontinence, difficulty urinating, dysuria, frequency, hematuria and nocturia.   Musculoskeletal:  Negative for arthralgias, back pain, flank pain, myalgias and neck pain.  Skin:  Negative for itching and rash.  Neurological:  Positive for headaches. Negative for dizziness and numbness.  Hematological:  Does not bruise/bleed easily.  Psychiatric/Behavioral:  Negative for depression, sleep disturbance and suicidal ideas. The patient is not nervous/anxious.   All other systems reviewed and are negative.   PAST MEDICAL/SURGICAL HISTORY:  Past Medical History:  Diagnosis Date   Anemia  Anxiety and depression    Arthritis of facet joints at multiple vertebral levels    L5-S1   Constipation    Dyslipidemia    Family history of breast cancer    Family history of uterine cancer    GERD (gastroesophageal reflux disease)    History of hiatal hernia     History of kidney stones    Insomnia    Irritable bowel syndrome    Migraine    Muscle tension headache    Neuropathy of finger    Ovarian carcinoma (HCC)    ovarian   Plantar fasciitis of right foot    Port-A-Cath in place 08/20/2018   Past Surgical History:  Procedure Laterality Date   ABDOMINAL HYSTERECTOMY     BIOPSY  10/27/2020   Procedure: BIOPSY;  Surgeon: Harvel Quale, MD;  [facility];  Service: Gastroenterology;;   CHOLECYSTECTOMY  2008   COLONOSCOPY N/A 08/13/2013   Procedure: COLONOSCOPY;  Surgeon: Rogene Houston, MD;  [facility];  Service: Endoscopy;  Laterality: N/A;  230-moved to 145 Ann to notify pt   COLONOSCOPY WITH PROPOFOL N/A 11/15/2020   Procedure: COLONOSCOPY WITH PROPOFOL;  Surgeon: Rogene Houston, MD;  [facility];  Service: Endoscopy;  Laterality: N/A;  1:40   ESOPHAGEAL DILATION N/A 02/28/2021   Procedure: ESOPHAGEAL DILATION;  Surgeon: Rogene Houston, MD;  [facility];  Service: Endoscopy;  Laterality: N/A;   ESOPHAGOGASTRODUODENOSCOPY     ESOPHAGOGASTRODUODENOSCOPY (EGD) WITH PROPOFOL N/A 07/20/2018   Procedure: ESOPHAGOGASTRODUODENOSCOPY (EGD) WITH PROPOFOL;  Surgeon: Rogene Houston, MD;  [facility];  Service: Endoscopy;  Laterality: N/A;  Possible esophageal dilation.   ESOPHAGOGASTRODUODENOSCOPY (EGD) WITH PROPOFOL N/A 10/27/2020   Procedure: ESOPHAGOGASTRODUODENOSCOPY (EGD) WITH PROPOFOL;  Surgeon: Harvel Quale, MD;  [facility];  Service: Gastroenterology;  Laterality: N/A;  2:10, pt knows to arrive at 10:15   ESOPHAGOGASTRODUODENOSCOPY (EGD) WITH PROPOFOL N/A 02/28/2021   Procedure: ESOPHAGOGASTRODUODENOSCOPY (EGD) WITH PROPOFOL;  Surgeon: Rogene Houston, MD;  [facility];  Service: Endoscopy;  Laterality: N/A;  200 ASA 1   GIVENS CAPSULE STUDY N/A 11/24/2020   Procedure: GIVENS CAPSULE STUDY;  Surgeon: Rogene Houston, MD;   [facility];  Service: Endoscopy;  Laterality: N/A;  7:30   IR ANGIOGRAM SELECTIVE EACH ADDITIONAL VESSEL  08/01/2018   IR ANGIOGRAM VISCERAL SELECTIVE  08/01/2018   IR EMBO ART  VEN HEMORR LYMPH EXTRAV  INC GUIDE ROADMAPPING  08/01/2018   IR IMAGING GUIDED PORT INSERTION  08/20/2018   IR PERC PLEURAL DRAIN W/INDWELL CATH W/IMG GUIDE  07/08/2018   IR THORACENTESIS ASP PLEURAL SPACE W/IMG GUIDE  07/07/2018   IR US GUIDE VASC ACCESS RIGHT  08/01/2018   PLEURAL EFFUSION DRAINAGE Left 07/13/2018   Procedure: DRAINAGE OF LOCULATED PLEURAL EFFUSION;  Surgeon: Ivin Poot, MD;  Location: [place];  Service: Thoracic;  Laterality: Left;   POLYPECTOMY  11/15/2020   Procedure: POLYPECTOMY;  Surgeon: Rogene Houston, MD;  [facility];  Service: Endoscopy;;   REMOVAL OF PLEURAL DRAINAGE CATHETER Left 08/20/2018   Procedure: REMOVAL OF PLEURAL DRAINAGE CATHETER;  Surgeon: Ivin Poot, MD;  Location: [place];  Service: Thoracic;  Laterality: Left;   REMOVAL OF PLEURAL DRAINAGE CATHETER Left 08/20/2018   Procedure: REMOVAL OF PLEURAL DRAINAGE CATHETER;  Surgeon: Ivin Poot, MD;  Location: [place];  Service: Thoracic;  Laterality: Left;   TALC PLEURODESIS  Left 07/13/2018   Procedure: Talc Pleuradesis;  Surgeon: Prescott Gum, Collier Salina, MD;  Location: [place];  Service: Thoracic;  Laterality: Left;   TOTAL HIP ARTHROPLASTY Left 06/05/2020   Procedure: LEFT TOTAL HIP ARTHROPLASTY ANTERIOR APPROACH;  Surgeon: Leandrew Koyanagi, MD;  Location: [place];  Service: Orthopedics;  Laterality: Left;  3-C   TUBAL LIGATION Bilateral    UTERINE ABLATION     VIDEO ASSISTED THORACOSCOPY Left 07/13/2018   Procedure: VIDEO ASSISTED THORACOSCOPY;  Surgeon: Ivin Poot, MD;  [facility];  Service: Thoracic;  Laterality: Left;    SOCIAL HISTORY:  Social History   Socioeconomic History   Marital status: Widowed    Spouse name: Not on file   Number of children: 2   Years of education: 2-College   Highest  education level: Not on file  Occupational History    Employer: [employer]  Tobacco Use   Smoking status: Former    Packs/day: 0.50    Years: 17.00    Total pack years: 8.50    Types: Cigarettes    Quit date: 06/22/2018    Years since quitting: 3.6   Smokeless tobacco: Never  Vaping Use   Vaping Use: Never used  Substance and Sexual Activity   Alcohol use: Yes    Alcohol/week: 1.0 standard drink of alcohol    Types: 1 Glasses of wine per week    Comment: occasionally   Drug use: No   Sexual activity: Not Currently  Other Topics Concern   Not on file  Social History Narrative   Patient lives at home with her daughter.    Patient has 2 children.    Patient is widowed.    Patient is right handed.    Patient has her Associates degree.      Social Determinants of Health   Financial Resource Strain: Low Risk  (07/22/2018)   Overall Financial Resource Strain (CARDIA)    Difficulty of Paying Living Expenses: Not hard at all  Food Insecurity: No Food Insecurity (07/22/2018)   Hunger Vital Sign    Worried About Running Out of Food in the Last Year: Never true    Ran Out of Food in the Last Year: Never true  Transportation Needs: No Transportation Needs (07/22/2018)   PRAPARE - Hydrologist (Medical): No    Lack of Transportation (Non-Medical): No  Physical Activity: Inactive (07/22/2018)   Exercise Vital Sign    Days of Exercise per Week: 0 days    Minutes of Exercise per Session: 0 min  Stress: Stress Concern Present (07/22/2018)   [place]    Feeling of Stress : Very much  Social Connections: Moderately Isolated (07/22/2018)   Social Connection and Isolation Panel [NHANES]    Frequency of Communication with Friends and Family: More than three times a week    Frequency of Social Gatherings with Friends and Family: More than three times a week    Attends Religious Services: 1 to 4 times per year     Active Member of [employer] or Organizations: No    Attends Archivist Meetings: Never    Marital Status: Widowed  Intimate Partner Violence: Not At Risk (07/22/2018)   Humiliation, Afraid, Rape, and Kick questionnaire    Fear of Current or Ex-Partner: No    Emotionally Abused: No    Physically Abused: No    Sexually Abused: No    FAMILY HISTORY:  Family  History  Problem Relation Age of Onset   Depression Mother    Hypertension Mother    Obesity Mother    Diabetes Mother    Kidney disease Mother    Peripheral vascular disease Father    Atrial fibrillation Father    Crohn's disease Sister    Uterine cancer Sister 22       maternal half sister   COPD Brother    Osteoporosis Brother    Breast cancer Maternal Aunt 93   Colon cancer Neg Hx     CURRENT MEDICATIONS:  Current Outpatient Medications  Medication Sig Dispense Refill   albuterol (VENTOLIN HFA) 108 (90 Base) MCG/ACT inhaler Inhale 2 puffs into the lungs every 6 (six) hours as needed for wheezing or shortness of breath.     B Complex-C (B-COMPLEX WITH VITAMIN C) tablet Take 1 tablet by mouth daily.     Cholecalciferol (VITAMIN D) 50 MCG (2000 UT) CAPS Take 2,000 Units by mouth daily.     diazepam (VALIUM) 5 MG tablet TAKE ONE TABLET BY MOUTH AT BEDTIME 30 tablet 5   dicyclomine (BENTYL) 10 MG capsule Take 1 capsule (10 mg total) by mouth 3 (three) times daily as needed for spasms. 90 capsule 3   folic acid (FOLVITE) 1 MG tablet Take 1 tablet (1 mg total) by mouth daily. 30 tablet 1   gabapentin (NEURONTIN) 100 MG capsule Take 1 capsule (100 mg total) by mouth at bedtime. 30 capsule 0   lidocaine-prilocaine (EMLA) cream Apply 1 application  topically as needed (pain).     linaclotide (LINZESS) 290 MCG CAPS capsule Take 1 capsule (290 mcg total) by mouth daily before breakfast. (Patient not taking: Reported on 02/11/2022) 30 capsule 6   LORazepam (ATIVAN) 0.5 MG tablet Take 0.5 mg by mouth 3 (three) times daily as  needed.     magic mouthwash w/lidocaine SOLN Take 5 mLs by mouth 3 (three) times daily as needed for mouth pain (Swish and spit). 480 mL 6   magnesium oxide (MAG-OX) 400 (240 Mg) MG tablet Take 1 tablet (400 mg total) by mouth 2 (two) times daily. 60 tablet 6   olaparib (LYNPARZA) 150 MG tablet Take 2 tablets (300 mg total) by mouth 2 (two) times daily. Swallow whole. May take with food to decrease nausea and vomiting. 120 tablet 2   omeprazole (PRILOSEC) 40 MG capsule TAKE ONE CAPSULE BY MOUTH TWICE DAILY BEFORE MEALS 60 capsule 5   ondansetron (ZOFRAN) 4 MG tablet TAKE ONE TABLET BY MOUTH EVERY 8 HOURS AS NEEDED FOR NAUSEA OR VOMITING 40 tablet 3   Oxycodone HCl 20 MG TABS Take 1 tablet (20 mg total) by mouth every 4 (four) hours as needed. 180 tablet 0   phentermine 15 MG capsule Take 15 mg by mouth daily.     prochlorperazine (COMPAZINE) 10 MG tablet Take 1 tablet (10 mg total) by mouth 2 (two) times daily as needed for nausea or vomiting. Take 30 minutes before Lynparza twice daily. 60 tablet 3   promethazine (PHENERGAN) 25 MG tablet Take 1 tablet (25 mg total) by mouth every 6 (six) hours as needed for nausea or vomiting. 60 tablet 4   sennosides-docusate sodium (SENOKOT-S) 8.6-50 MG tablet Take 1 tablet by mouth 2 (two) times daily.     SUMAtriptan (IMITREX) 100 MG tablet TAKE ONE TABLET BY MOUTH PRN UP to TWICE DAILY AS NEEDED. (Patient taking differently: Take 100 mg by mouth 2 (two) times daily as needed for migraine.) 9  tablet 5   triamcinolone cream (KENALOG) 0.1 % Apply 1 application topically 2 (two) times daily as needed (rash). 80 g 11   venlafaxine (EFFEXOR) 75 MG tablet Take 1 tablet (75 mg total) by mouth daily. 30 tablet 3   No current facility-administered medications for this visit.   Facility-Administered Medications Ordered in Other Visits  Medication Dose Route Frequency Provider Last Rate Last Admin   0.9 %  sodium chloride infusion   Intravenous Continuous Derek Jack, MD   Stopped at 10/23/20 1547   sodium chloride flush (NS) 0.9 % injection 10 mL  10 mL Intravenous PRN Derek Jack, MD   10 mL at 10/23/20 1544    ALLERGIES:  Allergies  Allergen Reactions   Morphine And Related Itching   Nickel Itching   Nortriptyline Other (See Comments)    Significant weight gain   Topamax [Topiramate] Diarrhea and Nausea Only   Xanax [Alprazolam]     Can't wake up    Actifed Cold-Allergy [Chlorpheniramine-Phenyleph Er] Rash    Red dye only   Amoxicillin Rash    Did it involve swelling of the face/tongue/throat, SOB, or low BP? Unknown Did it involve sudden or severe rash/hives, skin peeling, or any reaction on the inside of your mouth or nose? Unknown Did you need to seek medical attention at a hospital or doctor's office? Unknown When did it last happen? teenager       If all above answers are "NO", may proceed with cephalosporin use.    Codeine Hives   Erythromycin Rash   Penicillins Rash    Did it involve swelling of the face/tongue/throat, SOB, or low BP? Unknown Did it involve sudden or severe rash/hives, skin peeling, or any reaction on the inside of your mouth or nose? Unknown Did you need to seek medical attention at a hospital or doctor's office? Unknown When did it last happen? teenager       If all above answers are "NO", may proceed with cephalosporin use.    Sudafed [Pseudoephedrine Hcl] Rash    Red dye only    PHYSICAL EXAM:  Performance status (ECOG): 1 - Symptomatic but completely ambulatory  There were no vitals filed for this visit.   Wt Readings from Last 3 Encounters:  02/11/22 86.8 kg (191 lb 6.4 oz)  01/09/22 87 kg (191 lb 14.4 oz)  11/28/21 86.5 kg (190 lb 11.2 oz)   Physical Exam Vitals reviewed. Exam conducted with a chaperone present.  Constitutional:      Appearance: Normal appearance.  Cardiovascular:     Rate and Rhythm: Normal rate and regular rhythm.     Pulses: Normal pulses.     Heart  sounds: Normal heart sounds.  Pulmonary:     Effort: Pulmonary effort is normal.     Breath sounds: Normal breath sounds.  Abdominal:     Palpations: Abdomen is soft. There is no hepatomegaly, splenomegaly, mass or pulsatile mass.     Tenderness: There is abdominal tenderness in the left lower quadrant.  Lymphadenopathy:     Upper Body:     Right upper body: No supraclavicular, axillary or pectoral adenopathy.     Left upper body: No supraclavicular, axillary or pectoral adenopathy.  Neurological:     General: No focal deficit present.     Mental Status: She is alert and oriented to person, place, and time.  Psychiatric:        Mood and Affect: Mood normal.  Behavior: Behavior normal.      LABORATORY DATA:  I have reviewed the labs as listed.     Latest Ref Rng & Units 02/14/2022   12:29 PM 01/04/2022   11:15 AM 11/21/2021    2:16 PM  CBC  WBC 4.0 - 10.5 K/uL 6.6  10.9  7.5   Hemoglobin 12.0 - 15.0 g/dL 13.0  12.8  12.0   Hematocrit 36.0 - 46.0 % 38.1  37.1  35.1   Platelets 150 - 400 K/uL 283  283  306       Latest Ref Rng & Units 02/14/2022   12:29 PM 01/04/2022   11:15 AM 11/21/2021    2:16 PM  CMP  Glucose 70 - 99 mg/dL 143  111  142   BUN 6 - 20 mg/dL '15  19  16   '$ Creatinine 0.44 - 1.00 mg/dL 0.72  0.73  0.75   Sodium 135 - 145 mmol/L 134  136  138   Potassium 3.5 - 5.1 mmol/L 3.5  3.7  3.6   Chloride 98 - 111 mmol/L 99  101  104   CO2 22 - 32 mmol/L '26  28  26   '$ Calcium 8.9 - 10.3 mg/dL 9.1  9.1  9.2   Total Protein 6.5 - 8.1 g/dL 6.7  6.8  6.9   Total Bilirubin 0.3 - 1.2 mg/dL 0.3  0.4  0.5   Alkaline Phos 38 - 126 U/L 87  78  98   AST 15 - 41 U/L '28  17  23   '$ ALT 0 - 44 U/L 17  17  33     DIAGNOSTIC IMAGING:  I have independently reviewed the scans and discussed with the patient. No results found.   ASSESSMENT:  1.  Clinical stage IVa high-grade serous ovarian cancer, positive cytology of left pleural effusion: -4 cycles of carboplatin and  paclitaxel from 08/24/2018 through 12/01/2018. -Robotic assisted laparoscopic TAH and BSO and omentectomy on 12/24/2018, pathology showing high-grade serous carcinoma, PT3P NX. -Germline mutation testing was negative. -3 more cycles of adjuvant chemotherapy completed on 03/15/2019. -CTAP on 04/13/2019 showed no findings of active malignancy.  28% reduction in the volume of presumed chronic hematoma/chronic fluid collection splaying the upper margin of the spleen.  Large type III hiatal hernia. -CTAP on 10/13/2019 shows no findings of recurrence or metastatic disease. - Foundation 1 shows HRD+, LOH score>16%.  MSI-stable.  MYC amplification.  T p53 mutation. - We reviewed CT CAP from 02/21/2021 which showed progressive peritoneal metastasis compared to 12/06/2020 scan.  No bowel obstruction.  Small right pleural effusion with mild enlargement of an isolated left external iliac lymph node. - Reviewed EGD from 02/28/2021 which showed hiatal hernia and normal findings. - She reported epigastric and left upper quadrant pain worse in the last 1 week which is related to progression of her malignancy. - 6 cycles of carboplatin and paclitaxel from 03/13/2021 through 06/18/2021 - CT CAP (07/12/2021): No evidence of recurrence or metastatic disease. - Olaparib 300 mg twice daily started on 08/22/2021.    PLAN:  1.  Clinical stage IVa high-grade serous ovarian cancer, positive cytology of left pleural effusion, HRD positive: - CTAP on 01/04/2022: No evidence of recurrence or metastatic disease.  Large hiatal hernia.  Similar appearance of previous splenic hemorrhage. - She is tolerating olaparib very well. - Reviewed labs today which showed normal LFTs and CBC.  CA125 is 6.0. - She is seeing surgeon at [facility] for hiatal hernia surgery  next Friday. - She reports left lower quadrant cramping occasionally.  There is some tenderness in the left lower quadrant.  Will see her back in 6 weeks for follow-up with repeat CTAP and  tumor marker.   2.  Lower back/left upper quadrant pain: - Continue oxycodone 20 mg every 4-6 hours as needed.  Pain is stable.   3.  Numbness in the feet/cramping in the hands: - She could not tolerate more than 200 mg of gabapentin at bedtime.  More dose then that will make her loopy.  She still has some cramps in the hands.  If they are any worse, will consider Lyrica.  4.  Iron deficiency state: - Last Feraheme on 09/27/2021.  Ferritin is 135 and percent saturation 34. - Will repeat ferritin and iron panel at next visit.  Will consider refer him if ferritin level below 100.  5.  Constipation: - Continue stool softener twice daily.  Not requiring Linzess.  6.  Hypomagnesemia: - Continue magnesium twice daily.  Magnesium is normal.   Orders placed this encounter:  No orders of the defined types were placed in this encounter.   I,Alexis Herring,acting as a Education administrator for [employer], MD.,have documented all relevant documentation on the behalf of Derek Jack, MD,as directed by  Derek Jack, MD while in the presence of Derek Jack, MD.  I, Derek Jack MD, have reviewed the above documentation for accuracy and completeness, and I agree with the above.    Derek Jack, MD [place] [phone]

## 2022-02-20 NOTE — Progress Notes (Signed)
Patient is taking [place] ([place]) as prescribed.  She has not missed any doses and reports no side effects at this time.

## 2022-02-20 NOTE — Patient Instructions (Signed)
[place] at [facility] Discharge Instructions   You were seen and examined today by Dr. Delton Coombes.  He reviewed the results of your lab work which are normal/stable.   Continue Lynparza as prescribed.   We will see you back in 6 weeks. We will repeat lab work prior to this visit.    Thank you for choosing [place] at [facility] to provide your oncology and hematology care.  To afford each patient quality time with our provider, please arrive at least 15 minutes before your scheduled appointment time.   If you have a lab appointment with the [facility] please come in thru the Main Entrance and check in at the main information desk.  You need to re-schedule your appointment should you arrive 10 or more minutes late.  We strive to give you quality time with our providers, and arriving late affects you and other patients whose appointments are after yours.  Also, if you no show three or more times for appointments you may be dismissed from the clinic at the providers discretion.     Again, thank you for choosing [facility].  Our hope is that these requests will decrease the amount of time that you wait before being seen by our physicians.       _____________________________________________________________  Should you have questions after your visit to [facility], please contact our office at [phone] and follow the prompts.  Our office hours are 8:00 a.m. and 4:30 p.m. Monday - Friday.  Please note that voicemails left after 4:00 p.m. may not be returned until the following business day.  We are closed weekends and major holidays.  You do have access to a nurse 24-7, just call the main number to the clinic [phone] and do not press any options, hold on the line and a nurse will answer the phone.    For prescription refill requests, have your pharmacy contact our office and allow 72 hours.    Due to  Covid, you will need to wear a mask upon entering the hospital. If you do not have a mask, a mask will be given to you at the Main Entrance upon arrival. For doctor visits, patients may have 1 support person age 44 or older with them. For treatment visits, patients can not have anyone with them due to social distancing guidelines and our immunocompromised population.

## 2022-02-21 ENCOUNTER — Encounter (HOSPITAL_COMMUNITY): Payer: Self-pay | Admitting: Hematology

## 2022-02-25 ENCOUNTER — Ambulatory Visit (HOSPITAL_COMMUNITY): Payer: 59 | Admitting: Psychiatry

## 2022-02-25 ENCOUNTER — Telehealth (HOSPITAL_COMMUNITY): Payer: Self-pay | Admitting: Psychiatry

## 2022-02-25 NOTE — Telephone Encounter (Signed)
Therapist attempted to contact patient via phone regarding in office scheduled appointment and received voice mail message.  Therapist left message indicating attempt and requesting patient call office.

## 2022-02-28 ENCOUNTER — Encounter (INDEPENDENT_AMBULATORY_CARE_PROVIDER_SITE_OTHER): Payer: Self-pay | Admitting: Gastroenterology

## 2022-03-01 DIAGNOSIS — K449 Diaphragmatic hernia without obstruction or gangrene: Secondary | ICD-10-CM | POA: Diagnosis not present

## 2022-03-01 DIAGNOSIS — C563 Malignant neoplasm of bilateral ovaries: Secondary | ICD-10-CM | POA: Diagnosis not present

## 2022-03-01 DIAGNOSIS — Z87891 Personal history of nicotine dependence: Secondary | ICD-10-CM | POA: Diagnosis not present

## 2022-03-01 DIAGNOSIS — Z713 Dietary counseling and surveillance: Secondary | ICD-10-CM | POA: Diagnosis not present

## 2022-03-05 ENCOUNTER — Other Ambulatory Visit (HOSPITAL_COMMUNITY): Payer: Self-pay

## 2022-03-05 ENCOUNTER — Other Ambulatory Visit: Payer: Self-pay | Admitting: Hematology

## 2022-03-05 ENCOUNTER — Encounter (INDEPENDENT_AMBULATORY_CARE_PROVIDER_SITE_OTHER): Payer: Self-pay | Admitting: Gastroenterology

## 2022-03-05 ENCOUNTER — Telehealth (INDEPENDENT_AMBULATORY_CARE_PROVIDER_SITE_OTHER): Payer: Self-pay | Admitting: Gastroenterology

## 2022-03-05 DIAGNOSIS — K449 Diaphragmatic hernia without obstruction or gangrene: Secondary | ICD-10-CM

## 2022-03-05 MED ORDER — OXYCODONE HCL 20 MG PO TABS: 1.0000 | ORAL_TABLET | ORAL | 0 refills | Status: DC | PRN

## 2022-03-05 NOTE — Telephone Encounter (Signed)
Patient reached [place] to request EGD for preoperative purposes prior to her hiatal hernia repair.  Hi Michelle Holt/Michelle Holt,   Can you please schedule a EGD? Dx: hiatal hernia, preoperative. Room: 1  Thanks,  Maylon Peppers, MD Gastroenterology and Hepatology [facility]

## 2022-03-06 ENCOUNTER — Encounter: Payer: Self-pay | Admitting: *Deleted

## 2022-03-06 ENCOUNTER — Other Ambulatory Visit: Payer: Self-pay | Admitting: *Deleted

## 2022-03-06 ENCOUNTER — Telehealth: Payer: Self-pay | Admitting: *Deleted

## 2022-03-06 DIAGNOSIS — C563 Malignant neoplasm of bilateral ovaries: Secondary | ICD-10-CM

## 2022-03-06 NOTE — Telephone Encounter (Signed)
Pt has been scheduled for 03/29/22 at 2 pm. Instructions sent to pt via MyChart.

## 2022-03-06 NOTE — Telephone Encounter (Signed)
[employer] PA: APPROVED Authorization #: [id number] DOS: 03/29/22-06/27/22

## 2022-03-06 NOTE — Telephone Encounter (Signed)
Pt states she is no longer taking the phentermine.

## 2022-03-06 NOTE — Telephone Encounter (Signed)
Referral and notes sent to [facility]

## 2022-03-06 NOTE — Progress Notes (Signed)
CT AP changed to CT CAP per Dr. Tomie China request.

## 2022-03-07 ENCOUNTER — Other Ambulatory Visit (HOSPITAL_COMMUNITY): Payer: Self-pay

## 2022-03-07 NOTE — Addendum Note (Signed)
Addended by: Harvel Quale on: 03/07/2022 12:35 PM   Modules accepted: Orders

## 2022-03-07 NOTE — Telephone Encounter (Signed)
I received the most recent consult note from [place].  She is also requesting an upper GI series to be performed preoperatively.  Hi Mindy/Tammy,   Can you please schedule a upper GI series? Dx: Preoperative hiatal hernia repair evaluation.   Thanks,  Maylon Peppers, MD Gastroenterology and Hepatology [facility]enter[facility]

## 2022-03-11 ENCOUNTER — Ambulatory Visit (HOSPITAL_COMMUNITY)
Admission: RE | Admit: 2022-03-11 | Discharge: 2022-03-11 | Disposition: A | Discharge status: Home / Self Care | Payer: 59 | Source: Ambulatory Visit | Attending: Gastroenterology | Admitting: Gastroenterology

## 2022-03-11 ENCOUNTER — Ambulatory Visit (INDEPENDENT_AMBULATORY_CARE_PROVIDER_SITE_OTHER): Payer: 59 | Admitting: Psychiatry

## 2022-03-11 DIAGNOSIS — F321 Major depressive disorder, single episode, moderate: Secondary | ICD-10-CM | POA: Diagnosis not present

## 2022-03-11 DIAGNOSIS — K224 Dyskinesia of esophagus: Secondary | ICD-10-CM | POA: Diagnosis not present

## 2022-03-11 DIAGNOSIS — K449 Diaphragmatic hernia without obstruction or gangrene: Secondary | ICD-10-CM | POA: Diagnosis not present
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
## 2022-03-13 ENCOUNTER — Encounter (INDEPENDENT_AMBULATORY_CARE_PROVIDER_SITE_OTHER): Payer: Self-pay | Admitting: Gastroenterology

## 2022-03-25 ENCOUNTER — Telehealth: Payer: Self-pay | Admitting: *Deleted

## 2022-03-25 ENCOUNTER — Ambulatory Visit (HOSPITAL_COMMUNITY): Payer: 59 | Admitting: Psychiatry

## 2022-03-25 DIAGNOSIS — K449 Diaphragmatic hernia without obstruction or gangrene: Secondary | ICD-10-CM | POA: Diagnosis not present

## 2022-03-25 NOTE — Telephone Encounter (Signed)
Pt has been off the phentermine for a month. Endo made aware

## 2022-03-25 NOTE — Telephone Encounter (Signed)
-----   Message -----  From: Threasa Alpha, RN  Sent: 03/25/2022   1:22 PM EDT  To: Vicente Males, LPN   Magan is taking phentermine and this needs to be held 1 week prior to procedure.  She may have to be cancelled if she is still taking it .  thanks    [facility]

## 2022-03-27 ENCOUNTER — Inpatient Hospital Stay: Payer: 59 | Attending: Hematology

## 2022-03-27 ENCOUNTER — Other Ambulatory Visit: Payer: 59

## 2022-03-27 ENCOUNTER — Other Ambulatory Visit (HOSPITAL_COMMUNITY): Payer: Self-pay

## 2022-03-27 ENCOUNTER — Ambulatory Visit (HOSPITAL_COMMUNITY)
Admission: RE | Admit: 2022-03-27 | Discharge: 2022-03-27 | Disposition: A | Discharge status: Home / Self Care | Payer: 59 | Source: Ambulatory Visit | Attending: Hematology | Admitting: Hematology

## 2022-03-27 DIAGNOSIS — C786 Secondary malignant neoplasm of retroperitoneum and peritoneum: Secondary | ICD-10-CM | POA: Diagnosis not present

## 2022-03-27 DIAGNOSIS — K5900 Constipation, unspecified: Secondary | ICD-10-CM | POA: Diagnosis not present

## 2022-03-27 DIAGNOSIS — R1012 Left upper quadrant pain: Secondary | ICD-10-CM | POA: Insufficient documentation

## 2022-03-27 DIAGNOSIS — D509 Iron deficiency anemia, unspecified: Secondary | ICD-10-CM | POA: Diagnosis not present

## 2022-03-27 DIAGNOSIS — C563 Malignant neoplasm of bilateral ovaries: Secondary | ICD-10-CM | POA: Diagnosis not present

## 2022-03-27 DIAGNOSIS — D649 Anemia, unspecified: Secondary | ICD-10-CM

## 2022-03-27 DIAGNOSIS — Z90722 Acquired absence of ovaries, bilateral: Secondary | ICD-10-CM | POA: Diagnosis not present

## 2022-03-27 DIAGNOSIS — R918 Other nonspecific abnormal finding of lung field: Secondary | ICD-10-CM | POA: Diagnosis not present

## 2022-03-27 DIAGNOSIS — Z9079 Acquired absence of other genital organ(s): Secondary | ICD-10-CM | POA: Diagnosis not present

## 2022-03-27 DIAGNOSIS — M545 Low back pain, unspecified: Secondary | ICD-10-CM | POA: Diagnosis not present

## 2022-03-27 DIAGNOSIS — Z90710 Acquired absence of both cervix and uterus: Secondary | ICD-10-CM | POA: Insufficient documentation

## 2022-03-27 DIAGNOSIS — N2889 Other specified disorders of kidney and ureter: Secondary | ICD-10-CM | POA: Diagnosis not present

## 2022-03-27 LAB — COMPREHENSIVE METABOLIC PANEL
ALT: 19 U/L (ref 0–44)
AST: 23 U/L (ref 15–41)
Albumin: 3.9 g/dL (ref 3.5–5.0)
Alkaline Phosphatase: 76 U/L (ref 38–126)
Anion gap: 10 (ref 5–15)
BUN: 13 mg/dL (ref 6–20)
CO2: 25 mmol/L (ref 22–32)
Calcium: 8.8 mg/dL — ABNORMAL LOW (ref 8.9–10.3)
Chloride: 99 mmol/L (ref 98–111)
Creatinine, Ser: 0.72 mg/dL (ref 0.44–1.00)
GFR, Estimated: >60 mL/min (ref >60)
Glucose, Bld: 105 mg/dL — ABNORMAL HIGH (ref 70–99)
Potassium: 3.9 mmol/L (ref 3.5–5.1)
Sodium: 134 mmol/L — ABNORMAL LOW (ref 135–145)
Total Bilirubin: 0.5 mg/dL (ref 0.3–1.2)
Total Protein: 6.8 g/dL (ref 6.5–8.1)

## 2022-03-27 LAB — CBC WITH DIFFERENTIAL/PLATELET
Abs Immature Granulocytes: 0.03 10*3/uL (ref 0.00–0.07)
Basophils Absolute: 0.1 10*3/uL (ref 0.0–0.1)
Basophils Relative: 1 %
Eosinophils Absolute: 0.2 10*3/uL (ref 0.0–0.5)
Eosinophils Relative: 2 %
HCT: 36.2 % (ref 36.0–46.0)
Hemoglobin: 12.4 g/dL (ref 12.0–15.0)
Immature Granulocytes: 0 %
Lymphocytes Relative: 20 %
Lymphs Abs: 1.9 10*3/uL (ref 0.7–4.0)
MCH: 39.7 pg — ABNORMAL HIGH (ref 26.0–34.0)
MCHC: 34.3 g/dL (ref 30.0–36.0)
MCV: 116 fL — ABNORMAL HIGH (ref 80.0–100.0)
Monocytes Absolute: 0.5 10*3/uL (ref 0.1–1.0)
Monocytes Relative: 6 %
Neutro Abs: 6.9 10*3/uL (ref 1.7–7.7)
Neutrophils Relative %: 71 %
Platelets: 334 10*3/uL (ref 150–400)
RBC: 3.12 MIL/uL — ABNORMAL LOW (ref 3.87–5.11)
RDW: 14.4 % (ref 11.5–15.5)
WBC: 9.6 10*3/uL (ref 4.0–10.5)
nRBC: 0.3 % — ABNORMAL HIGH (ref 0.0–0.2)

## 2022-03-27 LAB — FERRITIN: Ferritin: 114 ng/mL (ref 11–307)

## 2022-03-27 LAB — IRON AND TIBC
Iron: 84 ug/dL (ref 28–170)
Saturation Ratios: 24 % (ref 10.4–31.8)
TIBC: 351 ug/dL (ref 250–450)
UIBC: 267 ug/dL

## 2022-03-27 LAB — MAGNESIUM: Magnesium: 1.8 mg/dL (ref 1.7–2.4)

## 2022-03-27 MED ORDER — HEPARIN SOD (PORK) LOCK FLUSH 100 UNIT/ML IV SOLN
INTRAVENOUS | Status: AC
  Administered 2022-03-27: 500 [IU] via INTRAVENOUS
  Filled 2022-03-27: qty 5

## 2022-03-27 MED ORDER — SODIUM CHLORIDE 0.9% FLUSH
10.0000 mL | INTRAVENOUS | Status: DC | PRN
  Administered 2022-03-27: 10 mL

## 2022-03-27 MED ORDER — IOHEXOL 9 MG/ML PO SOLN
ORAL | Status: AC
  Filled 2022-03-27: qty 500

## 2022-03-27 MED ORDER — IOHEXOL 300 MG/ML  SOLN
100.0000 mL | Freq: Once | INTRAMUSCULAR | Status: AC | PRN
  Administered 2022-03-27: 100 mL via INTRAVENOUS

## 2022-03-27 NOTE — Progress Notes (Signed)
Michelle Holt presented for Portacath access and flush and labs. Power port used for CT scan at [facility]. Port flushed easily without difficult and good blood return noted. Pt left accessed for CT scan.  Discharged from clinic ambulatory in stable condition. Alert and oriented x 3. F/U with [facility] as scheduled.

## 2022-03-29 ENCOUNTER — Other Ambulatory Visit: Payer: Self-pay

## 2022-03-29 ENCOUNTER — Ambulatory Visit (HOSPITAL_BASED_OUTPATIENT_CLINIC_OR_DEPARTMENT_OTHER): Payer: 59 | Admitting: Anesthesiology

## 2022-03-29 ENCOUNTER — Encounter (HOSPITAL_COMMUNITY)
Admission: RE | Disposition: A | Discharge status: Home / Self Care | Payer: Self-pay | Source: Ambulatory Visit | Attending: Gastroenterology

## 2022-03-29 ENCOUNTER — Ambulatory Visit (HOSPITAL_COMMUNITY): Payer: 59 | Admitting: Anesthesiology

## 2022-03-29 ENCOUNTER — Encounter (HOSPITAL_COMMUNITY): Payer: Self-pay | Admitting: Gastroenterology

## 2022-03-29 ENCOUNTER — Ambulatory Visit (HOSPITAL_COMMUNITY)
Admission: RE | Admit: 2022-03-29 | Discharge: 2022-03-29 | Disposition: A | Discharge status: Home / Self Care | Payer: 59 | Source: Ambulatory Visit | Attending: Gastroenterology | Admitting: Gastroenterology

## 2022-03-29 ENCOUNTER — Other Ambulatory Visit: Payer: Self-pay | Admitting: Hematology

## 2022-03-29 DIAGNOSIS — F418 Other specified anxiety disorders: Secondary | ICD-10-CM | POA: Diagnosis not present

## 2022-03-29 DIAGNOSIS — K589 Irritable bowel syndrome without diarrhea: Secondary | ICD-10-CM | POA: Insufficient documentation

## 2022-03-29 DIAGNOSIS — Z87891 Personal history of nicotine dependence: Secondary | ICD-10-CM | POA: Diagnosis not present

## 2022-03-29 DIAGNOSIS — E785 Hyperlipidemia, unspecified: Secondary | ICD-10-CM | POA: Insufficient documentation

## 2022-03-29 DIAGNOSIS — Z01818 Encounter for other preprocedural examination: Secondary | ICD-10-CM | POA: Insufficient documentation

## 2022-03-29 DIAGNOSIS — K259 Gastric ulcer, unspecified as acute or chronic, without hemorrhage or perforation: Secondary | ICD-10-CM | POA: Diagnosis not present

## 2022-03-29 DIAGNOSIS — K219 Gastro-esophageal reflux disease without esophagitis: Secondary | ICD-10-CM | POA: Diagnosis not present

## 2022-03-29 DIAGNOSIS — Z8543 Personal history of malignant neoplasm of ovary: Secondary | ICD-10-CM | POA: Insufficient documentation

## 2022-03-29 DIAGNOSIS — K449 Diaphragmatic hernia without obstruction or gangrene: Secondary | ICD-10-CM

## 2022-03-29 DIAGNOSIS — G709 Myoneural disorder, unspecified: Secondary | ICD-10-CM | POA: Insufficient documentation

## 2022-03-29 HISTORY — PX: ESOPHAGOGASTRODUODENOSCOPY (EGD) WITH PROPOFOL: SHX5813

## 2022-03-29 LAB — CA 125: Cancer Antigen (CA) 125: 6.1 U/mL (ref 0.0–38.1)

## 2022-03-29 SURGERY — ESOPHAGOGASTRODUODENOSCOPY (EGD) WITH PROPOFOL
Anesthesia: General

## 2022-03-29 MED ORDER — LIDOCAINE HCL (CARDIAC) PF 100 MG/5ML IV SOSY
PREFILLED_SYRINGE | INTRAVENOUS | Status: DC | PRN
  Administered 2022-03-29: 50 mg via INTRAVENOUS

## 2022-03-29 MED ORDER — LACTATED RINGERS IV SOLN: INTRAVENOUS | Status: DC

## 2022-03-29 MED ORDER — PROPOFOL 10 MG/ML IV BOLUS
INTRAVENOUS | Status: DC | PRN
  Administered 2022-03-29: 150 mg via INTRAVENOUS

## 2022-03-29 NOTE — Discharge Instructions (Signed)
You are being discharged to home.  Resume your previous diet.  Continue your present medications.  

## 2022-03-29 NOTE — H&P (Signed)
Michelle Holt is an 52 y.o. female.   Chief Complaint: Large hiatal hernia, preoperative HPI: 52 year old female with past medical history of anxiety, depression, hyperlipidemia, GERD, irritable bowel syndrome, ovarian carcinoma, coming for Large hiatal hernia, preoperative.  Patient has presented some episodes of dysphagia and has presented recurrent heartburn intermittently.  Currently undergoing evaluation for hiatal hernia repair.  Past Medical History:  Diagnosis Date   Anemia    Anxiety and depression    Arthritis of facet joints at multiple vertebral levels    L5-S1   Constipation    Dyslipidemia    Family history of breast cancer    Family history of uterine cancer    GERD (gastroesophageal reflux disease)    History of hiatal hernia    History of kidney stones    Insomnia    Irritable bowel syndrome    Migraine    Muscle tension headache    Neuropathy of finger    Ovarian carcinoma (HCC)    ovarian   Plantar fasciitis of right foot    Port-A-Cath in place 08/20/2018    Past Surgical History:  Procedure Laterality Date   ABDOMINAL HYSTERECTOMY     BIOPSY  10/27/2020   Procedure: BIOPSY;  Surgeon: Harvel Quale, MD;  [facility];  Service: Gastroenterology;;   CHOLECYSTECTOMY  2008   COLONOSCOPY N/A 08/13/2013   Procedure: COLONOSCOPY;  Surgeon: Rogene Houston, MD;  [facility];  Service: Endoscopy;  Laterality: N/A;  230-moved to 145 Ann to notify pt   COLONOSCOPY WITH PROPOFOL N/A 11/15/2020   Procedure: COLONOSCOPY WITH PROPOFOL;  Surgeon: Rogene Houston, MD;  [facility];  Service: Endoscopy;  Laterality: N/A;  1:40   ESOPHAGEAL DILATION N/A 02/28/2021   Procedure: ESOPHAGEAL DILATION;  Surgeon: Rogene Houston, MD;  [facility];  Service: Endoscopy;  Laterality: N/A;   ESOPHAGOGASTRODUODENOSCOPY     ESOPHAGOGASTRODUODENOSCOPY (EGD) WITH PROPOFOL N/A 07/20/2018   Procedure: ESOPHAGOGASTRODUODENOSCOPY  (EGD) WITH PROPOFOL;  Surgeon: Rogene Houston, MD;  [facility];  Service: Endoscopy;  Laterality: N/A;  Possible esophageal dilation.   ESOPHAGOGASTRODUODENOSCOPY (EGD) WITH PROPOFOL N/A 10/27/2020   Procedure: ESOPHAGOGASTRODUODENOSCOPY (EGD) WITH PROPOFOL;  Surgeon: Harvel Quale, MD;  [facility];  Service: Gastroenterology;  Laterality: N/A;  2:10, pt knows to arrive at 10:15   ESOPHAGOGASTRODUODENOSCOPY (EGD) WITH PROPOFOL N/A 02/28/2021   Procedure: ESOPHAGOGASTRODUODENOSCOPY (EGD) WITH PROPOFOL;  Surgeon: Rogene Houston, MD;  [facility];  Service: Endoscopy;  Laterality: N/A;  200 ASA 1   GIVENS CAPSULE STUDY N/A 11/24/2020   Procedure: GIVENS CAPSULE STUDY;  Surgeon: Rogene Houston, MD;  [facility];  Service: Endoscopy;  Laterality: N/A;  7:30   IR ANGIOGRAM SELECTIVE EACH ADDITIONAL VESSEL  08/01/2018   IR ANGIOGRAM VISCERAL SELECTIVE  08/01/2018   IR EMBO ART  VEN HEMORR LYMPH EXTRAV  INC GUIDE ROADMAPPING  08/01/2018   IR IMAGING GUIDED PORT INSERTION  08/20/2018   IR PERC PLEURAL DRAIN W/INDWELL CATH W/IMG GUIDE  07/08/2018   IR THORACENTESIS ASP PLEURAL SPACE W/IMG GUIDE  07/07/2018   IR US GUIDE VASC ACCESS RIGHT  08/01/2018   PLEURAL EFFUSION DRAINAGE Left 07/13/2018   Procedure: DRAINAGE OF LOCULATED PLEURAL EFFUSION;  Surgeon: Ivin Poot, MD;  Location: [place];  Service: Thoracic;  Laterality: Left;   POLYPECTOMY  11/15/2020   Procedure: POLYPECTOMY;  Surgeon: Rogene Houston, MD;  [facility];  Service: Endoscopy;;   REMOVAL OF PLEURAL DRAINAGE CATHETER Left 08/20/2018   Procedure: REMOVAL OF PLEURAL DRAINAGE CATHETER;  Surgeon: Ivin Poot, MD;  Location: [place];  Service: Thoracic;  Laterality: Left;   REMOVAL OF PLEURAL DRAINAGE CATHETER Left 08/20/2018   Procedure: REMOVAL OF PLEURAL DRAINAGE CATHETER;  Surgeon: Ivin Poot, MD;  Location: [place];  Service: Thoracic;  Laterality: Left;   TALC  PLEURODESIS Left 07/13/2018   Procedure: Talc Pleuradesis;  Surgeon: Prescott Gum, Collier Salina, MD;  Location: [place];  Service: Thoracic;  Laterality: Left;   TOTAL HIP ARTHROPLASTY Left 06/05/2020   Procedure: LEFT TOTAL HIP ARTHROPLASTY ANTERIOR APPROACH;  Surgeon: Leandrew Koyanagi, MD;  Location: [place];  Service: Orthopedics;  Laterality: Left;  3-C   TUBAL LIGATION Bilateral    UTERINE ABLATION     VIDEO ASSISTED THORACOSCOPY Left 07/13/2018   Procedure: VIDEO ASSISTED THORACOSCOPY;  Surgeon: Prescott Gum, Collier Salina, MD;  Location: [facility];  Service: Thoracic;  Laterality: Left;    Family History  Problem Relation Age of Onset   Depression Mother    Hypertension Mother    Obesity Mother    Diabetes Mother    Kidney disease Mother    Peripheral vascular disease Father    Atrial fibrillation Father    Crohn's disease Sister    Uterine cancer Sister 53       maternal half sister   COPD Brother    Osteoporosis Brother    Breast cancer Maternal Aunt 60   Colon cancer Neg Hx    Social History:  reports that she quit smoking about 3 years ago. Her smoking use included cigarettes. She has a 8.50 pack-year smoking history. She has never used smokeless tobacco. She reports current alcohol use of about 1.0 standard drink of alcohol per week. She reports that she does not use drugs.  Allergies:  Allergies  Allergen Reactions   Morphine And Related Itching   Nickel Itching   Nortriptyline Other (See Comments)    Significant weight gain   Topamax [Topiramate] Diarrhea and Nausea Only   Xanax [Alprazolam]     Can't wake up    Actifed Cold-Allergy [Chlorpheniramine-Phenyleph Er] Rash    Red dye only   Amoxicillin Rash    Did it involve swelling of the face/tongue/throat, SOB, or low BP? Unknown Did it involve sudden or severe rash/hives, skin peeling, or any reaction on the inside of your mouth or nose? Unknown Did you need to seek medical attention at a hospital or doctor's office? Unknown When did it last  happen? teenager       If all above answers are "NO", may proceed with cephalosporin use.    Codeine Hives   Erythromycin Rash   Penicillins Rash    Did it involve swelling of the face/tongue/throat, SOB, or low BP? Unknown Did it involve sudden or severe rash/hives, skin peeling, or any reaction on the inside of your mouth or nose? Unknown Did you need to seek medical attention at a hospital or doctor's office? Unknown When did it last happen? teenager       If all above answers are "NO", may proceed with cephalosporin use.    Sudafed [Pseudoephedrine Hcl] Rash    Red dye only    Medications Prior to Admission  Medication Sig Dispense Refill   albuterol (VENTOLIN HFA) 108 (90 Base) MCG/ACT inhaler Inhale 2 puffs into the lungs every 6 (six) hours as needed for wheezing or shortness of  breath.     B Complex-C (B-COMPLEX WITH VITAMIN C) tablet Take 1 tablet by mouth daily.     Bioflavonoid Products (VITAMIN C) CHEW Chew 1 tablet by mouth daily.     Cholecalciferol (VITAMIN D) 50 MCG (2000 UT) CAPS Take 2,000 Units by mouth daily.     Cyanocobalamin (B-12) 3000 MCG CAPS Take 3,000 mcg by mouth every other day.     diazepam (VALIUM) 5 MG tablet TAKE ONE TABLET BY MOUTH AT BEDTIME 30 tablet 5   docusate sodium (COLACE) 250 MG capsule Take 250 mg by mouth 2 (two) times daily.     folic acid (FOLVITE) A999333 MCG tablet Take 400 mcg by mouth daily.     gabapentin (NEURONTIN) 100 MG capsule Take 1 capsule (100 mg total) by mouth at bedtime. (Patient taking differently: Take 200 mg by mouth at bedtime.) 30 capsule 0   loratadine (CLARITIN) 10 MG tablet Take 10 mg by mouth daily as needed for allergies.     LORazepam (ATIVAN) 0.5 MG tablet Take 0.5 mg by mouth 3 (three) times daily as needed for anxiety.     magnesium oxide (MAG-OX) 400 (240 Mg) MG tablet Take 1 tablet (400 mg total) by mouth 2 (two) times daily. 60 tablet 6   olaparib (LYNPARZA) 150 MG tablet Take 2 tablets (300 mg total) by mouth  2 (two) times daily. Swallow whole. May take with food to decrease nausea and vomiting. 120 tablet 2   omeprazole (PRILOSEC) 40 MG capsule TAKE ONE CAPSULE BY MOUTH TWICE DAILY BEFORE MEALS 60 capsule 5   ondansetron (ZOFRAN) 4 MG tablet TAKE ONE TABLET BY MOUTH EVERY 8 HOURS AS NEEDED FOR NAUSEA OR VOMITING (Patient taking differently: Take 4 mg by mouth daily.) 40 tablet 3   Oxycodone HCl 20 MG TABS Take 1 tablet (20 mg total) by mouth every 4 (four) hours as needed. 180 tablet 0   prochlorperazine (COMPAZINE) 10 MG tablet Take 1 tablet (10 mg total) by mouth 2 (two) times daily as needed for nausea or vomiting. Take 30 minutes before Lynparza twice daily. 60 tablet 3   promethazine (PHENERGAN) 25 MG tablet Take 1 tablet (25 mg total) by mouth every 6 (six) hours as needed for nausea or vomiting. (Patient taking differently: Take 25 mg by mouth at bedtime.) 60 tablet 4   venlafaxine (EFFEXOR) 75 MG tablet Take 1 tablet (75 mg total) by mouth daily. 30 tablet 3   dicyclomine (BENTYL) 10 MG capsule Take 1 capsule (10 mg total) by mouth 3 (three) times daily as needed for spasms. (Patient not taking: Reported on 03/27/2022) 90 capsule 3   folic acid (FOLVITE) 1 MG tablet Take 1 tablet (1 mg total) by mouth daily. 30 tablet 1   linaclotide (LINZESS) 290 MCG CAPS capsule Take 1 capsule (290 mcg total) by mouth daily before breakfast. (Patient not taking: Reported on 03/27/2022) 30 capsule 6   magic mouthwash w/lidocaine SOLN Take 5 mLs by mouth 3 (three) times daily as needed for mouth pain (Swish and spit). (Patient not taking: Reported on 03/27/2022) 480 mL 6   SUMAtriptan (IMITREX) 100 MG tablet TAKE ONE TABLET BY MOUTH PRN UP to TWICE DAILY AS NEEDED. (Patient taking differently: Take 100 mg by mouth 2 (two) times daily as needed for migraine.) 9 tablet 5   triamcinolone cream (KENALOG) 0.1 % Apply 1 application topically 2 (two) times daily as needed (rash). (Patient not taking: Reported on 03/27/2022) 80  g 11  No results found for this or any previous visit (from the past 48 hour(s)). No results found.  Review of Systems  HENT:  Positive for trouble swallowing.   All other systems reviewed and are negative.   Blood pressure 129/67, temperature 98.5 F (36.9 C), temperature source Oral, resp. rate (!) 25, height 5\' 4"  (1.626 m), weight 85.7 kg, SpO2 93 %. Physical Exam  GENERAL: The patient is AO x3, in no acute distress. HEENT: Head is normocephalic and atraumatic. EOMI are intact. Mouth is well hydrated and without lesions. NECK: Supple. No masses LUNGS: Clear to auscultation. No presence of rhonchi/wheezing/rales. Adequate chest expansion HEART: RRR, normal s1 and s2. ABDOMEN: Soft, nontender, no guarding, no peritoneal signs, and nondistended. BS +. No masses. EXTREMITIES: Without any cyanosis, clubbing, rash, lesions or edema. NEUROLOGIC: AOx3, no focal motor deficit. SKIN: no jaundice, no rashes  Assessment/Plan  52 year old female with past medical history of anxiety, depression, hyperlipidemia, GERD, irritable bowel syndrome, ovarian carcinoma, coming for Large hiatal hernia, preoperative.  Will proceed with EGD. Harvel Quale, MD 03/29/2022, 12:50 PM

## 2022-03-29 NOTE — Transfer of Care (Signed)
Immediate Anesthesia Transfer of Care Note  Patient: Michelle Holt  Procedure(s) Performed: ESOPHAGOGASTRODUODENOSCOPY (EGD) WITH PROPOFOL  Patient Location: Endoscopy Unit  Anesthesia Type:General  Level of Consciousness: awake  Airway & Oxygen Therapy: Patient Spontanous Breathing  Post-op Assessment: Report given to RN and Post -op Vital signs reviewed and stable  Post vital signs: Reviewed and stable  Last Vitals:  Vitals Value Taken Time  BP    Temp    Pulse    Resp    SpO2      Last Pain:  Vitals:   03/29/22 1244  TempSrc: Oral  PainSc: 4       Patients Stated Pain Goal: 5 ([id number])  Complications: No notable events documented.

## 2022-03-29 NOTE — Op Note (Signed)
[facility] Patient Name: Michelle Holt Procedure Date: 03/29/2022 2:19 PM MRN: [id number] Date of Birth: 1970/12/30 Attending MD: Maylon Peppers , , [id number] CSN: [id number] Age: 52 Admit Type: Outpatient Procedure:                Upper GI endoscopy Indications:              Preoperative assessment Providers:                Maylon Peppers, Lurline Del, RN, Kristine L. Risa Grill, Technician Referring MD:              Medicines:                Monitored Anesthesia Care Complications:            No immediate complications. Estimated Blood Loss:     Estimated blood loss: none. Procedure:                Pre-Anesthesia Assessment:                           - Prior to the procedure, a History and Physical                            was performed, and patient medications, allergies                            and sensitivities were reviewed. The patient's                            tolerance of previous anesthesia was reviewed.                           - The risks and benefits of the procedure and the                            sedation options and risks were discussed with the                            patient. All questions were answered and informed                            consent was obtained.                           - ASA Grade Assessment: II - A patient with mild                            systemic disease.                           After obtaining informed consent, the endoscope was                            passed under direct vision. Throughout the  procedure, the patient's blood pressure, pulse, and                            oxygen saturations were monitored continuously. The                            [id number]) scope was introduced through the                            mouth, and advanced to the second part of duodenum.                            The upper GI endoscopy was accomplished without                             difficulty. The patient tolerated the procedure                            well. Scope In: 2:29:22 PM Scope Out: 2:32:05 PM Total Procedure Duration: 0 hours 2 minutes 43 seconds  Findings:      An 8 cm hiatal hernia with a few Cameron ulcers was found. This was a       paraesophageal hernia. The hiatal narrowing was 42 cm from the incisors.       The Z-line was 34 cm from the incisors.      The exam of the stomach was otherwise normal.      The examined duodenum was normal. Impression:               - 8 cm hiatal hernia with a few Cameron ulcers.                           - Normal examined duodenum.                           - No specimens collected. Moderate Sedation:      Per Anesthesia Care Recommendation:           - Discharge patient to home (ambulatory).                           - Resume previous diet.                           - Continue present medications. Procedure Code(s):        --- Professional ---                           [phone], Esophagogastroduodenoscopy, flexible,                            transoral; diagnostic, including collection of                            specimen(s) by brushing or washing, when performed                            (  separate procedure) Diagnosis Code(s):        --- Professional ---                           K44.9, Diaphragmatic hernia without obstruction or                            gangrene                           K25.9, Gastric ulcer, unspecified as acute or                            chronic, without hemorrhage or perforation                           Z01.818, Encounter for other preprocedural                            examination CPT copyright 2022 [employer]. All rights reserved. The codes documented in this report are preliminary and upon coder review may  be revised to meet current compliance requirements. Maylon Peppers, MD Maylon Peppers,  03/29/2022 2:37:03 PM This report has been  signed electronically. Number of Addenda: 0

## 2022-03-29 NOTE — Anesthesia Preprocedure Evaluation (Signed)
Anesthesia Evaluation  Patient identified by MRN, date of birth, ID band Patient awake    Reviewed: Allergy & Precautions, H&P , NPO status , Patient's Chart, lab work & pertinent test results, reviewed documented beta blocker date and time   Airway Mallampati: II  TM Distance: >3 FB Neck ROM: full    Dental no notable dental hx.    Pulmonary neg pulmonary ROS, pneumonia, former smoker   Pulmonary exam normal breath sounds clear to auscultation       Cardiovascular Exercise Tolerance: Good negative cardio ROS  Rhythm:regular Rate:Normal     Neuro/Psych  Headaches PSYCHIATRIC DISORDERS Anxiety Depression     Neuromuscular disease negative neurological ROS  negative psych ROS   GI/Hepatic negative GI ROS, Neg liver ROS, hiatal hernia,GERD  ,,  Endo/Other  negative endocrine ROS    Renal/GU negative Renal ROS  negative genitourinary   Musculoskeletal   Abdominal   Peds  Hematology negative hematology ROS (+) Blood dyscrasia, anemia   Anesthesia Other Findings   Reproductive/Obstetrics negative OB ROS                             Anesthesia Physical Anesthesia Plan  ASA: 2  Anesthesia Plan: General   Post-op Pain Management:    Induction:   PONV Risk Score and Plan: Propofol infusion  Airway Management Planned:   Additional Equipment:   Intra-op Plan:   Post-operative Plan:   Informed Consent: I have reviewed the patients History and Physical, chart, labs and discussed the procedure including the risks, benefits and alternatives for the proposed anesthesia with the patient or authorized representative who has indicated his/her understanding and acceptance.     Dental Advisory Given  Plan Discussed with: CRNA  Anesthesia Plan Comments:        Anesthesia Quick Evaluation

## 2022-03-30 NOTE — Anesthesia Postprocedure Evaluation (Signed)
Anesthesia Post Note  Patient: SHERISSA GRAVELY  Procedure(s) Performed: ESOPHAGOGASTRODUODENOSCOPY (EGD) WITH PROPOFOL  Patient location during evaluation: Phase II Anesthesia Type: General Level of consciousness: awake Pain management: pain level controlled Vital Signs Assessment: post-procedure vital signs reviewed and stable Respiratory status: spontaneous breathing and respiratory function stable Cardiovascular status: blood pressure returned to baseline and stable Postop Assessment: no headache and no apparent nausea or vomiting Anesthetic complications: no Comments: Late entry   No notable events documented.   Last Vitals:  Vitals:   03/29/22 1435 03/29/22 1439  BP: (!) 91/32 130/61  Pulse: 82 81  Resp: 20 15  Temp: 36.5 C   SpO2: 93% 93%    Last Pain:  Vitals:   03/29/22 1435  TempSrc: Oral  PainSc: 0-No pain                 Louann Sjogren

## 2022-04-01 ENCOUNTER — Encounter (INDEPENDENT_AMBULATORY_CARE_PROVIDER_SITE_OTHER): Payer: Self-pay | Admitting: *Deleted

## 2022-04-02 ENCOUNTER — Other Ambulatory Visit: Payer: Self-pay | Admitting: *Deleted

## 2022-04-02 ENCOUNTER — Other Ambulatory Visit (HOSPITAL_COMMUNITY): Payer: Self-pay

## 2022-04-02 MED ORDER — OXYCODONE HCL 20 MG PO TABS: 1.0000 | ORAL_TABLET | ORAL | 0 refills | Status: DC | PRN

## 2022-04-02 MED ORDER — GABAPENTIN 100 MG PO CAPS: 200.0000 mg | ORAL_CAPSULE | Freq: Every day | ORAL | 0 refills | Status: DC

## 2022-04-02 NOTE — Progress Notes (Signed)
[facility] [address], [place] [address]    Clinic Day:  04/03/2022  Referring physician: [name], MD  Patient Care Team: [name], MD as PCP - General (Internal Medicine) Derek Jack, MD as Medical Oncologist (Oncology)   ASSESSMENT & PLAN:   Assessment: 1.  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 -Robotic assisted laparoscopic TAH and BSO and omentectomy on 12/24/2018, pathology showing high-grade serous carcinoma, PT3P NX. -Germline mutation testing was negative. -3 more cycles of adjuvant chemotherapy completed on 03/15/2019. -CTAP on 04/13/2019 showed no findings of active malignancy.  28% reduction in the volume of presumed chronic hematoma/chronic fluid collection splaying the upper margin of the spleen.  Large type III hiatal hernia. -CTAP on 10/13/2019 shows no findings of recurrence or metastatic disease. - Foundation 1 shows HRD+, LOH score>16%.  MSI-stable.  MYC amplification.  T p53 mutation. - We reviewed CT CAP from 02/21/2021 which showed progressive peritoneal metastasis compared to 12/06/2020 scan.  No bowel obstruction.  Small right pleural effusion with mild enlargement of an isolated left external iliac lymph node. - Reviewed EGD from 02/28/2021 which showed hiatal hernia and normal findings. - She reported epigastric and left upper quadrant pain worse in the last 1 week which is related to progression of her malignancy. - 6 cycles of carboplatin and paclitaxel from 03/13/2021 through 06/18/2021 - CT CAP (07/12/2021): No evidence of recurrence or metastatic disease. - Olaparib 300 mg twice daily started on 08/22/2021.  Plan: 1.  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 - CT CAP (03/27/2022):  Images reviewed by me with the patient showed no evidence of metastatic disease.  Large hiatal hernia was seen.  Lower pole left kidney lesion is a complex cyst. - She had upper endoscopy on 03/29/2022 which showed hiatal hernia and Cameron ulcers. - I reviewed labs which showed normal LFTs and creatinine.  Electrolytes were normal.  CBC was grossly normal.  CA125 is 6.1. - Continue olaparib 300 mg twice daily.  RTC 2 months with repeat labs. - She is seeing her surgeon at [facility] on 04/26/2022 in preparation for her upcoming hiatal hernia surgery.   2.  Lower back/left upper quadrant pain: - Continue oxycodone 20 mg every 4-6 hours as needed.  Pain is stable. - She asked about if splenectomy would get rid of the pain.  She was getting of having spleen taken out during the time of her hiatal hernia surgery.  I have discussed that she needs to be vaccinated for encapsulated organisms at least 2 to 4 weeks prior to splenectomy.  She will talk to her surgeon and let us know.   3.  Numbness in the feet/cramping in the hands: - Continue gabapentin 200 mg at bedtime.  She could not tolerate higher doses.  4.  Iron deficiency state: - Last Feraheme on 09/27/2021.  Latest ferritin is 114 and percent saturation 24.  Hemoglobin is 12.4.  Will consider parenteral iron therapy if ferritin drops below 100.  5.  Constipation: - Continue stool softener twice daily.  Not requiring Linzess.  6.  Hypomagnesemia: - Continue magnesium twice daily.  Magnesium is normal.  Orders Placed This Encounter  Procedures   CBC with Differential/Platelet    Standing Status:   Future    Standing Expiration  Date:   04/03/2023    Order Specific Question:   Release to patient    Answer:   Immediate   Comprehensive metabolic panel    Standing Status:   Future    Standing Expiration Date:   04/03/2023    Order Specific Question:   Release to patient    Answer:   Immediate   CA 125    Standing Status:   Future    Standing  Expiration Date:   04/03/2023   Ferritin    Standing Status:   Future    Standing Expiration Date:   04/03/2023    Order Specific Question:   Release to patient    Answer:   Immediate   Iron and TIBC    Standing Status:   Future    Standing Expiration Date:   04/03/2023    Order Specific Question:   Release to patient    Answer:   Immediate      I,[name],acting as a scribe for Derek Jack, MD.,have documented all relevant documentation on the behalf of Derek Jack, MD,as directed by  Derek Jack, MD while in the presence of Derek Jack, MD.   I, Derek Jack MD, have reviewed the above documentation for accuracy and completeness, and I agree with the above.   Derek Jack, MD   3/20/20243:40 PM  CHIEF COMPLAINT:   Diagnosis: high-grade serous ovarian cancer    Cancer Staging  No matching staging information was found for the patient.   Prior Therapy: 1. Carboplatin and paclitaxel x 7 cycles from 08/24/2018 to 03/15/2019. 2. Laparoscopic TAH & BSO & omenectomy on 12/24/2018. 3.  6 cycles of carboplatin and paclitaxel from 03/05/2021 to 06/18/2021  Current Therapy:  olaparib    HISTORY OF PRESENT ILLNESS:   Oncology History  Ovarian cancer, bilateral ([place])  07/07/2018 Pathology Results   PLEURAL FLUID, LEFT (SPECIMEN 1 OF 1, COLLECTED 07/07/18): - MALIGNANT CELLS CONSISTENT WITH ADENOCARCINOMA - SEE COMMENT  Source Pleural Fluid, (Specimen 1 of 1, collected on 07/07/2018) Gross Specimen: Received is/are 1000cc of bloody red fluid with tissue. (TC:tc) Prepared: # Smears: 0 # Concentration Technique Slides (i.e. ThinPrep): 1 # Cell Block: 1 Conventional Additional Studies: Two Hematology slides labeled [id number] Comment The malignant cells are positive for cytokeratin 7, p53, WT-1, Pax-8, Moc31, ER (weak) and EMA but negative for cytokeratin 20, TTF-1, GATA-3, CDX-2 and D2-40. Overall, the phenotype is consistent with a  gynecologic primary; clinical correlation recommended.   07/07/2018 Procedure   Successful ultrasound guided left thoracentesis yielding 2.0 L of pleural fluid   07/08/2018 Procedure   1. Technically successful placement of left 14 French pigtail chest drain, placed to Pleur-evac water-seal.   07/08/2018 Procedure   1. Technically successful five [place] double lumen power injectable PICC placement   07/09/2018 Imaging   Ct chest 1. There is a moderate, loculated left hydropneumothorax with a small air component and moderate fluid component. The largest loculated component is located posteriorly. There is a pigtail drainage catheter about the lateral pleural space. There is no obvious etiology, such as obvious mass or pleural disease.   2. There is a small right pleural effusion with associated atelectasis or consolidation and a subpleural consolidation of the superior segment right lower lobe (series 4, image 56), of uncertain significance, possibly infectious or inflammatory   07/10/2018 Imaging   Ct abdomen and pelvis: 1. The bilateral ovaries are enlarged by heterogeneous appearing cystic lesions, measuring 5.3 x 4.2 cm on the right (series  4, image 72) and 4.5 x 3.2 cm on the left (series 4, image 75). Consider dedicated pelvic ultrasound and/or pelvic MRI to further evaluate for solid components given high suspicion for GYN primary malignancy.   2. No other evidence of mass and no lymphadenopathy in the abdomen or pelvis.   3. Trace ascites. There is some suggestion of omental and peritoneal nodularity (e.g. Series 4, image 40), concerning for peritoneal metastatic disease.    4. Loculated left-sided pleural effusion with left-sided pleural drainage catheter in position. Small right pleural effusion   07/13/2018 Surgery   OPERATION: 1.  Left VATS (video-assisted thoracoscopic surgery) for drainage of loculated pleural effusion. 2.  Talc pleurodesis for malignant pleural effusion. 3.   Placement of PleurX catheter for management of malignant pleural effusion. 4.  Placement of On-Q analgesia catheter system.    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   07/13/2018 Pathology Results   Pleura, peel, Left Pleural - FIBRO-FIBRINOUS PLEURITIS - NEGATIVE FOR MALIGNANCY   07/18/2018 Initial Diagnosis   Ovarian cancer, bilateral ([place])   07/20/2018 Procedure   EGD impression: Normal proximal esophagus and mid esophagus. Mild distal esophageal rings; dilation not performed because of esophagitis. LA Grade C reflux esophagitis. Z-line regular, 30 cm from the incisors. 5 cm hiatal hernia. Non-bleeding gastric ulcer with no stigmata of bleeding. Gastritis. Duodenal erosions without bleeding. Normal second portion of the duodenum. No specimens collected.   08/19/2018 Genetic Testing   Negative genetic testing on the common hereditary cancer panel.  The Common Hereditary Gene Panel offered by [facility] includes sequencing and/or deletion duplication testing of the following 48 genes: APC, ATM, AXIN2, BARD1, BMPR1A, BRCA1, BRCA2, BRIP1, CDH1, CDK4, CDKN2A (p14ARF), CDKN2A (p16INK4a), CHEK2, CTNNA1, DICER1, EPCAM (Deletion/duplication testing only), GREM1 (promoter region deletion/duplication testing only), KIT, MEN1, MLH1, MSH2, MSH3, MSH6, MUTYH, NBN, NF1, NHTL1, PALB2, PDGFRA, PMS2, POLD1, POLE, PTEN, RAD50, RAD51C, RAD51D, RNF43, SDHB, SDHC, SDHD, SMAD4, SMARCA4. STK11, TP53, TSC1, TSC2, and VHL.  The following genes were evaluated for sequence changes only: SDHA and HOXB13 c.251G>A variant only. The report date is August 19, 2018.   08/24/2018 - 03/15/2019 Chemotherapy   Patient is on Treatment Plan : OVARIAN Carboplatin (AUC 6) / Paclitaxel (175) q21d x 6 cycles     03/05/2021 - 06/18/2021 Chemotherapy    Patient is on Treatment Plan : OVARIAN Carboplatin (AUC 6) / Paclitaxel (175) q21d x 6 cycles        INTERVAL HISTORY:   Michelle Holt is a 52 y.o. female presenting to clinic today for follow up of high-grade serous ovarian cancer. She was last seen by me on 02/20/22.  Since her last visit, she underwent restaging CT C/A/P on 03/27/22 showing: no findings of metastatic disease.  Of note, she is followed by GI for dysphagia and a hiatal hernia. She underwent EGD on 03/29/22 with Dr. Montez Morita showing: 8 cm hiatal hernia with a few Cameron ulcers; normal duodenum.  Today, she states that she is doing well overall. Her appetite level is at 50%. Her energy level is at 40%. She reports that abdominal pain is controlled with current pain medication.   PAST MEDICAL HISTORY:   Past Medical History: Past Medical History:  Diagnosis Date   Anemia    Anxiety and depression    Arthritis of facet joints at multiple vertebral levels  L5-S1   Constipation    Dyslipidemia    Family history of breast cancer    Family history of uterine cancer    GERD (gastroesophageal reflux disease)    History of hiatal hernia    History of kidney stones    Insomnia    Irritable bowel syndrome    Migraine    Muscle tension headache    Neuropathy of finger    Ovarian carcinoma (H[facility])    ovarian   Plantar fasciitis of right foot    Port-A-Cath in place 08/20/2018    Surgical History: Past Surgical History:  Procedure Laterality Date   ABDOMINAL HYSTERECTOMY     BIOPSY  10/27/2020   Procedure: BIOPSY;  Surgeon: Harvel Quale, MD;  [facility];  Service: Gastroenterology;;   CHOLECYSTECTOMY  2008   COLONOSCOPY N/A 08/13/2013   Procedure: COLONOSCOPY;  Surgeon: Rogene Houston, MD;  [facility];  Service: Endoscopy;  Laterality: N/A;  230-moved to 145 Ann to notify pt   COLONOSCOPY WITH PROPOFOL N/A 11/15/2020   Procedure: COLONOSCOPY WITH PROPOFOL;  Surgeon: Rogene Houston, MD;  [facility];  Service: Endoscopy;  Laterality: N/A;  1:40   ESOPHAGEAL DILATION N/A 02/28/2021   Procedure: ESOPHAGEAL DILATION;  Surgeon: Rogene Houston, MD;  [facility];  Service: Endoscopy;  Laterality: N/A;   ESOPHAGOGASTRODUODENOSCOPY     ESOPHAGOGASTRODUODENOSCOPY (EGD) WITH PROPOFOL N/A 07/20/2018   Procedure: ESOPHAGOGASTRODUODENOSCOPY (EGD) WITH PROPOFOL;  Surgeon: Rogene Houston, MD;  [facility];  Service: Endoscopy;  Laterality: N/A;  Possible esophageal dilation.   ESOPHAGOGASTRODUODENOSCOPY (EGD) WITH PROPOFOL N/A 10/27/2020   Procedure: ESOPHAGOGASTRODUODENOSCOPY (EGD) WITH PROPOFOL;  Surgeon: Harvel Quale, MD;  [facility];  Service: Gastroenterology;  Laterality: N/A;  2:10, pt knows to arrive at 10:15   ESOPHAGOGASTRODUODENOSCOPY (EGD) WITH PROPOFOL N/A 02/28/2021   Procedure: ESOPHAGOGASTRODUODENOSCOPY (EGD) WITH PROPOFOL;  Surgeon: Rogene Houston, MD;  [facility];  Service: Endoscopy;  Laterality: N/A;  200 ASA 1   GIVENS CAPSULE STUDY N/A 11/24/2020   Procedure: GIVENS CAPSULE STUDY;  Surgeon: Rogene Houston, MD;  [facility];  Service: Endoscopy;  Laterality: N/A;  7:30   IR ANGIOGRAM SELECTIVE EACH ADDITIONAL VESSEL  08/01/2018   IR ANGIOGRAM VISCERAL SELECTIVE  08/01/2018   IR EMBO ART  VEN HEMORR LYMPH EXTRAV  INC GUIDE ROADMAPPING  08/01/2018   IR IMAGING GUIDED PORT INSERTION  08/20/2018   IR PERC PLEURAL DRAIN W/INDWELL CATH W/IMG GUIDE  07/08/2018   IR THORACENTESIS ASP PLEURAL SPACE W/IMG GUIDE  07/07/2018   IR US GUIDE VASC ACCESS RIGHT  08/01/2018   PLEURAL EFFUSION DRAINAGE Left 07/13/2018   Procedure: DRAINAGE OF LOCULATED PLEURAL EFFUSION;  Surgeon: Ivin Poot, MD;  Location: [facility];  Service: Thoracic;  Laterality: Left;   POLYPECTOMY  11/15/2020   Procedure: POLYPECTOMY;  Surgeon: Rogene Houston, MD;  [facility];  Service: Endoscopy;;    REMOVAL OF PLEURAL DRAINAGE CATHETER Left 08/20/2018   Procedure: REMOVAL OF PLEURAL DRAINAGE CATHETER;  Surgeon: Ivin Poot, MD;  Location: [facility];  Service: Thoracic;  Laterality: Left;   REMOVAL OF PLEURAL DRAINAGE CATHETER Left 08/20/2018   Procedure: REMOVAL OF PLEURAL DRAINAGE CATHETER;  Surgeon: Ivin Poot, MD;  Location: [facility];  Service: Thoracic;  Laterality: Left;   TALC PLEURODESIS Left 07/13/2018   Procedure: Talc Pleuradesis;  Surgeon: Prescott Gum, Collier Salina, MD;  Location: [facility];  Service: Thoracic;  Laterality: Left;   TOTAL HIP ARTHROPLASTY Left 06/05/2020   Procedure: LEFT TOTAL HIP ARTHROPLASTY ANTERIOR APPROACH;  Surgeon: Leandrew Koyanagi, MD;  Location: [facility];  Service: Orthopedics;  Laterality: Left;  3-C   TUBAL LIGATION Bilateral    UTERINE ABLATION     VIDEO ASSISTED THORACOSCOPY Left 07/13/2018   Procedure: VIDEO ASSISTED THORACOSCOPY;  Surgeon: Ivin Poot, MD;  Location: [facility];  Service: Thoracic;  Laterality: Left;    Social History: Social History   Socioeconomic History   Marital status: Widowed    Spouse name: Not on file   Number of children: 2   Years of education: 2-College   Highest education level: Not on file  Occupational History    Employer: [employer]  Tobacco Use   Smoking status: Former    Packs/day: 0.50    Years: 17.00    Additional pack years: 0.00    Total pack years: 8.50    Types: Cigarettes    Quit date: 06/22/2018    Years since quitting: 3.7   Smokeless tobacco: Never  Vaping Use   Vaping Use: Never used  Substance and Sexual Activity   Alcohol use: Yes    Alcohol/week: 1.0 standard drink of alcohol    Types: 1 Glasses of wine per week    Comment: occasionally   Drug use: No   Sexual activity: Not Currently  Other Topics Concern   Not on file  Social History Narrative   Patient lives at home with her daughter.    Patient has 2 children.    Patient is widowed.    Patient is right handed.    Patient has her Associates  degree.      Social Determinants of Health   Financial Resource Strain: Low Risk  (07/22/2018)   Overall Financial Resource Strain (CARDIA)    Difficulty of Paying Living Expenses: Not hard at all  Food Insecurity: No Food Insecurity (07/22/2018)   Hunger Vital Sign    Worried About Running Out of Food in the Last Year: Never true    Ran Out of Food in the Last Year: Never true  Transportation Needs: No Transportation Needs (07/22/2018)   PRAPARE - Hydrologist (Medical): No    Lack of Transportation (Non-Medical): No  Physical Activity: Inactive (07/22/2018)   Exercise Vital Sign    Days of Exercise per Week: 0 days    Minutes of Exercise per Session: 0 min  Stress: Stress Concern Present (07/22/2018)   [place]    Feeling of Stress : Very much  Social Connections: Moderately Isolated (07/22/2018)   Social Connection and Isolation Panel [NHANES]    Frequency of Communication with Friends and Family: More than three times a week    Frequency of Social Gatherings with Friends and Family: More than three times a week    Attends Religious Services: 1 to 4 times per year    Active Member of [employer] or Organizations: No    Attends Archivist Meetings: Never    Marital Status: Widowed  Intimate Partner Violence: Not At Risk (07/22/2018)   Humiliation, Afraid, Rape, and Kick questionnaire    Fear of Current or Ex-Partner: No    Emotionally Abused: No    Physically Abused: No    Sexually Abused: No    Family History: Family History  Problem Relation Age of Onset   Depression  Mother    Hypertension Mother    Obesity Mother    Diabetes Mother    Kidney disease Mother    Peripheral vascular disease Father    Atrial fibrillation Father    Crohn's disease Sister    Uterine cancer Sister 66       maternal half sister   COPD Brother    Osteoporosis Brother    Breast cancer Maternal Aunt 38    Colon cancer Neg Hx     Current Medications:  Current Outpatient Medications:    albuterol (VENTOLIN HFA) 108 (90 Base) MCG/ACT inhaler, Inhale 2 puffs into the lungs every 6 (six) hours as needed for wheezing or shortness of breath., Disp: , Rfl:    B Complex-C (B-COMPLEX WITH VITAMIN C) tablet, Take 1 tablet by mouth daily., Disp: , Rfl:    Bioflavonoid Products (VITAMIN C) CHEW, Chew 1 tablet by mouth daily., Disp: , Rfl:    Cholecalciferol (VITAMIN D) 50 MCG (2000 UT) CAPS, Take 2,000 Units by mouth daily., Disp: , Rfl:    Cyanocobalamin (B-12) 3000 MCG CAPS, Take 3,000 mcg by mouth every other day., Disp: , Rfl:    diazepam (VALIUM) 5 MG tablet, TAKE ONE TABLET BY MOUTH AT BEDTIME, Disp: 30 tablet, Rfl: 5   dicyclomine (BENTYL) 10 MG capsule, Take 1 capsule (10 mg total) by mouth 3 (three) times daily as needed for spasms., Disp: 90 capsule, Rfl: 3   docusate sodium (COLACE) 250 MG capsule, Take 250 mg by mouth 2 (two) times daily., Disp: , Rfl:    folic acid (FOLVITE) 1 MG tablet, Take 1 tablet (1 mg total) by mouth daily., Disp: 30 tablet, Rfl: 1   folic acid (FOLVITE) A999333 MCG tablet, Take 400 mcg by mouth daily., Disp: , Rfl:    gabapentin (NEURONTIN) 100 MG capsule, Take 2 capsules (200 mg total) by mouth at bedtime., Disp: 60 capsule, Rfl: 0   linaclotide (LINZESS) 290 MCG CAPS capsule, Take 1 capsule (290 mcg total) by mouth daily before breakfast., Disp: 30 capsule, Rfl: 6   loratadine (CLARITIN) 10 MG tablet, Take 10 mg by mouth daily as needed for allergies., Disp: , Rfl:    LORazepam (ATIVAN) 0.5 MG tablet, Take 0.5 mg by mouth 3 (three) times daily as needed for anxiety., Disp: , Rfl:    magic mouthwash w/lidocaine SOLN, Take 5 mLs by mouth 3 (three) times daily as needed for mouth pain (Swish and spit)., Disp: 480 mL, Rfl: 6   magnesium oxide (MAG-OX) 400 (240 Mg) MG tablet, Take 1 tablet (400 mg total) by mouth 2 (two) times daily., Disp: 60 tablet, Rfl: 6   olaparib  (LYNPARZA) 150 MG tablet, Take 2 tablets (300 mg total) by mouth 2 (two) times daily. Swallow whole. May take with food to decrease nausea and vomiting., Disp: 120 tablet, Rfl: 2   omeprazole (PRILOSEC) 40 MG capsule, TAKE ONE CAPSULE BY MOUTH TWICE DAILY BEFORE MEALS, Disp: 60 capsule, Rfl: 5   ondansetron (ZOFRAN) 4 MG tablet, TAKE ONE TABLET BY MOUTH EVERY 8 HOURS AS NEEDED FOR NAUSEA OR VOMITING (Patient taking differently: Take 4 mg by mouth daily.), Disp: 40 tablet, Rfl: 3   Oxycodone HCl 20 MG TABS, Take 1 tablet (20 mg total) by mouth every 4 (four) hours as needed., Disp: 180 tablet, Rfl: 0   prochlorperazine (COMPAZINE) 10 MG tablet, Take 1 tablet (10 mg total) by mouth 2 (two) times daily as needed for nausea or vomiting. Take 30 minutes before  Lynparza twice daily., Disp: 60 tablet, Rfl: 3   promethazine (PHENERGAN) 25 MG tablet, Take 1 tablet (25 mg total) by mouth every 6 (six) hours as needed for nausea or vomiting. (Patient taking differently: Take 25 mg by mouth at bedtime.), Disp: 60 tablet, Rfl: 4   SUMAtriptan (IMITREX) 100 MG tablet, TAKE ONE TABLET BY MOUTH PRN UP to TWICE DAILY AS NEEDED. (Patient taking differently: Take 100 mg by mouth 2 (two) times daily as needed for migraine.), Disp: 9 tablet, Rfl: 5   triamcinolone cream (KENALOG) 0.1 %, Apply 1 application topically 2 (two) times daily as needed (rash)., Disp: 80 g, Rfl: 11   venlafaxine (EFFEXOR) 75 MG tablet, Take 1 tablet (75 mg total) by mouth daily., Disp: 30 tablet, Rfl: 3 No current facility-administered medications for this visit.  Facility-Administered Medications Ordered in Other Visits:    0.9 %  sodium chloride infusion, , Intravenous, Continuous, Derek Jack, MD, Stopped at 10/23/20 1547   sodium chloride flush (NS) 0.9 % injection 10 mL, 10 mL, Intravenous, PRN, Derek Jack, MD, 10 mL at 10/23/20 1544   Allergies: Allergies  Allergen Reactions   Morphine And Related Itching   Nickel  Itching   Nortriptyline Other (See Comments)    Significant weight gain   Topamax [Topiramate] Diarrhea and Nausea Only   Xanax [Alprazolam]     Can't wake up    Actifed Cold-Allergy [Chlorpheniramine-Phenyleph Er] Rash    Red dye only   Amoxicillin Rash    Did it involve swelling of the face/tongue/throat, SOB, or low BP? Unknown Did it involve sudden or severe rash/hives, skin peeling, or any reaction on the inside of your mouth or nose? Unknown Did you need to seek medical attention at a hospital or doctor's office? Unknown When did it last happen? teenager       If all above answers are "NO", may proceed with cephalosporin use.    Codeine Hives   Erythromycin Rash   Penicillins Rash    Did it involve swelling of the face/tongue/throat, SOB, or low BP? Unknown Did it involve sudden or severe rash/hives, skin peeling, or any reaction on the inside of your mouth or nose? Unknown Did you need to seek medical attention at a hospital or doctor's office? Unknown When did it last happen? teenager       If all above answers are "NO", may proceed with cephalosporin use.    Sudafed [Pseudoephedrine Hcl] Rash    Red dye only    REVIEW OF SYSTEMS:   Review of Systems  Constitutional:  Negative for chills, fatigue and fever.  HENT:   Positive for trouble swallowing. Negative for lump/mass, mouth sores, nosebleeds and sore throat.   Eyes:  Negative for eye problems.  Respiratory:  Negative for cough and shortness of breath.   Cardiovascular:  Negative for chest pain, leg swelling and palpitations.  Gastrointestinal:  Positive for nausea. Negative for abdominal pain, constipation, diarrhea and vomiting.  Genitourinary:  Negative for bladder incontinence, difficulty urinating, dysuria, frequency, hematuria and nocturia.   Musculoskeletal:  Negative for arthralgias, back pain, flank pain, myalgias and neck pain.  Skin:  Negative for itching and rash.  Neurological:  Positive for numbness.  Negative for dizziness and headaches.  Hematological:  Does not bruise/bleed easily.  Psychiatric/Behavioral:  Positive for depression. Negative for sleep disturbance and suicidal ideas. The patient is nervous/anxious.   All other systems reviewed and are negative.    VITALS:   Blood pressure 110/64, pulse  83, temperature 98.3 F (36.8 C), temperature source Oral, resp. rate 17, SpO2 99 %.  Wt Readings from Last 3 Encounters:  03/29/22 189 lb (85.7 kg)  02/20/22 190 lb 3.2 oz (86.3 kg)  02/11/22 191 lb 6.4 oz (86.8 kg)    There is no height or weight on file to calculate BMI.  Performance status (ECOG): 1 - Symptomatic but completely ambulatory  PHYSICAL EXAM:   Physical Exam Vitals and nursing note reviewed. Exam conducted with a chaperone present.  Constitutional:      Appearance: Normal appearance.  Cardiovascular:     Rate and Rhythm: Normal rate and regular rhythm.     Pulses: Normal pulses.     Heart sounds: Normal heart sounds.  Pulmonary:     Effort: Pulmonary effort is normal.     Breath sounds: Normal breath sounds.  Abdominal:     Palpations: Abdomen is soft. There is no hepatomegaly, splenomegaly or mass.     Tenderness: There is no abdominal tenderness.  Musculoskeletal:     Right lower leg: No edema.     Left lower leg: No edema.  Lymphadenopathy:     Cervical: No cervical adenopathy.     Right cervical: No superficial, deep or posterior cervical adenopathy.    Left cervical: No superficial, deep or posterior cervical adenopathy.     Upper Body:     Right upper body: No supraclavicular or axillary adenopathy.     Left upper body: No supraclavicular or axillary adenopathy.  Neurological:     General: No focal deficit present.     Mental Status: She is alert and oriented to person, place, and time.  Psychiatric:        Mood and Affect: Mood normal.        Behavior: Behavior normal.     LABS:      Latest Ref Rng & Units 03/27/2022   10:04 AM  02/14/2022   12:29 PM 01/04/2022   11:15 AM  CBC  WBC 4.0 - 10.5 K/uL 9.6  6.6  10.9   Hemoglobin 12.0 - 15.0 g/dL 12.4  13.0  12.8   Hematocrit 36.0 - 46.0 % 36.2  38.1  37.1   Platelets 150 - 400 K/uL 334  283  283       Latest Ref Rng & Units 03/27/2022   10:04 AM 02/14/2022   12:29 PM 01/04/2022   11:15 AM  CMP  Glucose 70 - 99 mg/dL 105  143  111   BUN 6 - 20 mg/dL 13  15  19    Creatinine 0.44 - 1.00 mg/dL 0.72  0.72  0.73   Sodium 135 - 145 mmol/L 134  134  136   Potassium 3.5 - 5.1 mmol/L 3.9  3.5  3.7   Chloride 98 - 111 mmol/L 99  99  101   CO2 22 - 32 mmol/L 25  26  28    Calcium 8.9 - 10.3 mg/dL 8.8  9.1  9.1   Total Protein 6.5 - 8.1 g/dL 6.8  6.7  6.8   Total Bilirubin 0.3 - 1.2 mg/dL 0.5  0.3  0.4   Alkaline Phos 38 - 126 U/L 76  87  78   AST 15 - 41 U/L 23  28  17    ALT 0 - 44 U/L 19  17  17       No results found for: "CEA1", "CEA" / No results found for: "CEA1", "CEA" No results found for: "PSA1" No results  found for: "CAN199" Lab Results  Component Value Date   CAN125 6.1 03/27/2022    No results found for: "TOTALPROTELP", "ALBUMINELP", "A1GS", "A2GS", "BETS", "BETA2SER", "GAMS", "MSPIKE", "SPEI" Lab Results  Component Value Date   TIBC 351 03/27/2022   TIBC 355 02/14/2022   TIBC 355 11/21/2021   FERRITIN 114 03/27/2022   FERRITIN 135 02/14/2022   FERRITIN 233 11/21/2021   IRONPCTSAT 24 03/27/2022   IRONPCTSAT 34 (H) 02/14/2022   IRONPCTSAT 50 (H) 11/21/2021   Lab Results  Component Value Date   LDH 277 (H) 07/18/2018   LDH 193 (H) 07/07/2018     STUDIES:   CT CHEST ABDOMEN PELVIS W CONTRAST  Result Date: 03/28/2022 CLINICAL DATA:  Status post chemotherapy for ovarian cancer. Hysterectomy. Cholecystectomy. * Tracking Code: BO * EXAM: CT CHEST, ABDOMEN, AND PELVIS WITH CONTRAST TECHNIQUE: Multidetector CT imaging of the chest, abdomen and pelvis was performed following the standard protocol during bolus administration of intravenous contrast.  RADIATION DOSE REDUCTION: This exam was performed according to the departmental dose-optimization program which includes automated exposure control, adjustment of the mA and/or kV according to patient size and/or use of iterative reconstruction technique. CONTRAST:  111mL OMNIPAQUE IOHEXOL 300 MG/ML  SOLN COMPARISON:  01/04/2022 abdominopelvic CT.  CTA chest 11/12/2021. FINDINGS: CT CHEST FINDINGS Cardiovascular: Right Port-A-Cath tip high right atrium. Normal caliber of the aorta and branch vessels. Normal heart size, without pericardial effusion. Mediastinum/Nodes: No supraclavicular adenopathy. No mediastinal or hilar adenopathy. Large hiatal hernia, with nearly the entire stomach positioned in the chest. Organo-axial position. Lungs/Pleura: No pleural fluid. Right base volume loss secondary to the hiatal hernia. Left pleural calcifications superiorly and medially are likely related to prior talc pleurodesis. Musculoskeletal: No acute osseous abnormality. CT ABDOMEN PELVIS FINDINGS Hepatobiliary: Focal steatosis adjacent the falciform ligament. No suspicious liver lesion. Cholecystectomy, without biliary ductal dilatation. Pancreas: Normal, without mass or ductal dilatation. Spleen: Similar appearance of the spleen, with a cystic exophytic collection measuring 6.6 cm superiorly on 45/2. Speckled calcifications at the junction of this component/mass and the remainder of the spleen. Splenic artery embolization coils. Adrenals/Urinary Tract: Normal adrenal glands. Normal right kidney. Lower pole left renal 1.0 cm low-density lesion is similar in size to on the prior exam but measures greater than fluid density. No hydronephrosis. Degraded evaluation of the pelvis, secondary to beam hardening artifact from left hip arthroplasty. Grossly normal urinary bladder. Stomach/Bowel: Hiatal hernia as detailed above. A portion of the transverse colon is also positioned within the hernia sac, similar to December. No  obstruction. Normal terminal ileum and appendix. Normal small bowel. Vascular/Lymphatic: Aortic atherosclerosis. No abdominopelvic adenopathy. Reproductive: Hysterectomy.  No adnexal mass. Other: No significant free fluid. Small bilateral fat containing inguinal hernias. No evidence of omental or peritoneal disease. Musculoskeletal: Left hip arthroplasty. No acute osseous abnormality. Mild osteopenia IMPRESSION: 1. No findings of metastatic disease within the chest, abdomen, or pelvis. 2. Large hiatal hernia with the majority of the stomach and a small amount of the transverse colon positioned in the lower chest. Organo-axial gastric position/volvulus without obstruction. 3. Lower pole left renal lesion which measures greater than fluid density and 1.0 cm. This most likely represents a minimally complex cyst, given size stability back to 07/10/2020. Recommend attention on follow-up. 4. Similar appearance of the splenic/perisplenic complex collection, likely a hematoma. 5.  Aortic Atherosclerosis (ICD10-I70.0). Electronically Signed   By: Abigail Miyamoto M.D.   On: 03/28/2022 14:58   DG UGI W DOUBLE CM (HD BA)  Result Date:  03/11/2022 CLINICAL DATA:  Large hiatal hernia, preoperative evaluation, food sticking in distal esophagus and chest EXAM: UPPER GI SERIES WITHOUT KUB TECHNIQUE: Routine upper GI series was performed with thick barium with effervescent granules and thin barium. FLUOROSCOPY: Radiation Exposure Index (as provided by the fluoroscopic device): 40.6 mGy Kerma COMPARISON:  None Available. FINDINGS: Diffuse esophageal dysmotility. Incomplete clearance of barium by primary peristaltic waves with scattered secondary and tertiary waves as well as intermittent retrograde peristalsis. No obvious mass or stricture. Large hiatal hernia with over 50% of the stomach in the chest. The portion of the stomach in the chest is flipped organo-axial. Stomach distends without mass or ulceration. Contrast does not empty  from the hiatal hernia into the distal stomach with the patient supine but does empty with patient in RIGHT lateral decubitus and RAO positioning. Remainder of stomach normal appearance. Duodenal bulb and sweep normal appearance. Ligament of Treitz normal position with visualize jejunal loops normal appearance. Question embolization coils in LEFT upper quadrant. Surgical clips RIGHT upper quadrant likely reflects prior cholecystectomy. IMPRESSION: Esophageal dysmotility. Large hiatal hernia with more than 50% of stomach in the chest. No emptying of contrast from the large hiatal hernia with patient supine; emptying from the hiatal hernia is facilitated by RIGHT lateral decubitus and RAO positioning. Electronically Signed   By: Lavonia Dana M.D.   On: 03/11/2022 09:27

## 2022-04-03 ENCOUNTER — Inpatient Hospital Stay (HOSPITAL_BASED_OUTPATIENT_CLINIC_OR_DEPARTMENT_OTHER): Payer: 59 | Admitting: Hematology

## 2022-04-03 VITALS — BP 110/64 | HR 83 | Temp 98.3°F | Resp 17

## 2022-04-03 DIAGNOSIS — C786 Secondary malignant neoplasm of retroperitoneum and peritoneum: Secondary | ICD-10-CM | POA: Diagnosis not present

## 2022-04-03 DIAGNOSIS — D509 Iron deficiency anemia, unspecified: Secondary | ICD-10-CM | POA: Diagnosis not present

## 2022-04-03 DIAGNOSIS — R1012 Left upper quadrant pain: Secondary | ICD-10-CM | POA: Diagnosis not present

## 2022-04-03 DIAGNOSIS — C563 Malignant neoplasm of bilateral ovaries: Secondary | ICD-10-CM | POA: Diagnosis not present

## 2022-04-03 DIAGNOSIS — M545 Low back pain, unspecified: Secondary | ICD-10-CM | POA: Diagnosis not present

## 2022-04-03 DIAGNOSIS — D649 Anemia, unspecified: Secondary | ICD-10-CM

## 2022-04-03 DIAGNOSIS — K5900 Constipation, unspecified: Secondary | ICD-10-CM | POA: Diagnosis not present

## 2022-04-03 NOTE — Patient Instructions (Addendum)
[facility]  Discharge Instructions  You were seen and examined today by Dr. Delton Coombes.  Dr. Delton Coombes discussed your most recent lab work and CT scan which revealed that everything looks good cancer wise. It shows the hernia and the cyst on your kidney.   Follow-up as scheduled in 2 months.    Thank you for choosing [facility] to provide your oncology and hematology care.   To afford each patient quality time with our provider, please arrive at least 15 minutes before your scheduled appointment time. You may need to reschedule your appointment if you arrive late (10 or more minutes). Arriving late affects you and other patients whose appointments are after yours.  Also, if you miss three or more appointments without notifying the office, you may be dismissed from the clinic at the provider's discretion.    Again, thank you for choosing [facility].  Our hope is that these requests will decrease the amount of time that you wait before being seen by our physicians.   If you have a lab appointment with the [facility] please come in thru the Main Entrance and check in at the main information desk.           _____________________________________________________________  Should you have questions after your visit to [facility], please contact our office at [phone] and follow the prompts.  Our office hours are 8:00 a.m. to 4:30 p.m. Monday - Thursday and 8:00 a.m. to 2:30 p.m. Friday.  Please note that voicemails left after 4:00 p.m. may not be returned until the following business day.  We are closed weekends and all major holidays.  You do have access to a nurse 24-7, just call the main number to the clinic [phone] and do not press any options, hold on the line and a nurse will answer the phone.    For prescription refill requests, have your pharmacy contact our office and allow 72 hours.     Masks are optional in the cancer centers. If you would like for your care team to wear a mask while they are taking care of you, please let them know. You may have one support person who is at least 53 years old accompany you for your appointments.

## 2022-04-05 ENCOUNTER — Other Ambulatory Visit: Payer: Self-pay

## 2022-04-09 ENCOUNTER — Encounter (HOSPITAL_COMMUNITY): Payer: Self-pay | Admitting: Gastroenterology

## 2022-04-15 ENCOUNTER — Ambulatory Visit (HOSPITAL_COMMUNITY): Payer: 59 | Admitting: Psychiatry

## 2022-04-17 DIAGNOSIS — H6123 Impacted cerumen, bilateral: Secondary | ICD-10-CM | POA: Diagnosis not present

## 2022-04-17 DIAGNOSIS — J029 Acute pharyngitis, unspecified: Secondary | ICD-10-CM | POA: Diagnosis not present

## 2022-04-17 DIAGNOSIS — R221 Localized swelling, mass and lump, neck: Secondary | ICD-10-CM | POA: Diagnosis not present

## 2022-04-17 DIAGNOSIS — K122 Cellulitis and abscess of mouth: Secondary | ICD-10-CM | POA: Diagnosis not present

## 2022-04-20 DIAGNOSIS — S82831A Other fracture of upper and lower end of right fibula, initial encounter for closed fracture: Secondary | ICD-10-CM | POA: Diagnosis not present

## 2022-04-20 DIAGNOSIS — S92901A Unspecified fracture of right foot, initial encounter for closed fracture: Secondary | ICD-10-CM | POA: Diagnosis not present

## 2022-04-20 DIAGNOSIS — M7989 Other specified soft tissue disorders: Secondary | ICD-10-CM | POA: Diagnosis not present

## 2022-04-20 DIAGNOSIS — M25571 Pain in right ankle and joints of right foot: Secondary | ICD-10-CM | POA: Diagnosis not present

## 2022-04-20 DIAGNOSIS — W1830XA Fall on same level, unspecified, initial encounter: Secondary | ICD-10-CM | POA: Diagnosis not present

## 2022-04-20 DIAGNOSIS — S82434A Nondisplaced oblique fracture of shaft of right fibula, initial encounter for closed fracture: Secondary | ICD-10-CM | POA: Diagnosis not present

## 2022-04-20 DIAGNOSIS — L539 Erythematous condition, unspecified: Secondary | ICD-10-CM | POA: Diagnosis not present

## 2022-04-20 DIAGNOSIS — S8254XA Nondisplaced fracture of medial malleolus of right tibia, initial encounter for closed fracture: Secondary | ICD-10-CM | POA: Diagnosis not present

## 2022-04-22 DIAGNOSIS — R5383 Other fatigue: Secondary | ICD-10-CM | POA: Diagnosis not present

## 2022-04-22 DIAGNOSIS — M549 Dorsalgia, unspecified: Secondary | ICD-10-CM | POA: Diagnosis not present

## 2022-04-22 DIAGNOSIS — D649 Anemia, unspecified: Secondary | ICD-10-CM | POA: Diagnosis not present

## 2022-04-23 ENCOUNTER — Ambulatory Visit (INDEPENDENT_AMBULATORY_CARE_PROVIDER_SITE_OTHER): Payer: 59 | Admitting: Orthopaedic Surgery

## 2022-04-23 ENCOUNTER — Encounter: Payer: Self-pay | Admitting: Physician Assistant

## 2022-04-23 DIAGNOSIS — M25571 Pain in right ankle and joints of right foot: Secondary | ICD-10-CM | POA: Diagnosis not present

## 2022-04-23 MED ORDER — RIVAROXABAN 10 MG PO TABS: 10.0000 mg | ORAL_TABLET | Freq: Every day | ORAL | 0 refills | Status: DC

## 2022-04-23 NOTE — Progress Notes (Signed)
Office Visit Note   Patient: Michelle Holt           Date of Birth: [birth year]           MRN: [id number] Visit Date: 04/23/2022              Requested by: Ignatius Specking, MD [address] [place],  [place] [address] PCP: Ignatius Specking, MD   Assessment & Plan: Visit Diagnoses:  1. Pain in right ankle and joints of right foot     Plan: Impression is approximately 4 days status post right ankle fracture.  This fracture pattern should be amenable to nonoperative treatment.  Will place her in a cam boot nonweightbearing.  She will ice and elevate is much as possible.  Continue taking her chronic pain medication as needed.  We have discussed starting her on a baby aspirin for DVT prophylaxis however she is told us that her GI doctor prefers for not taking NSAIDs due to history of gastric ulcer.  We have also discussed that in less the surgery for hiatal hernia becomes urgent we would like for her to wait until she is bearing weight to the right lower extremity prior to undergoing this procedure.  Will have her follow-up with [place] in 2 weeks for repeat evaluation and three-view x-rays of the right ankle.  Call with concerns or questions.  Will place her on xarelto for DVT ppx due to h/o cancer.  Follow-Up Instructions: Return in about 2 weeks (around 05/07/2022).   Orders:  No orders of the defined types were placed in this encounter.  No orders of the defined types were placed in this encounter.     Procedures: No procedures performed   Clinical Data: No additional findings.   Subjective: Chief Complaint  Patient presents with   Right Ankle - Injury    DOI 04/19/2022    HPI patient is a pleasant 52 year old female who comes in today following an injury to her right ankle.  On 04/19/2022, she stepped off an embankment injuring her right ankle.  She was seen where x-rays showed a distal fibula fracture in addition to avulsion to the medial mall.  She was placed in a short leg splint.  She is  here today for follow-up.  She is having pain and swelling to the entire ankle.  She is on chronic oxycodone and is also taking Tylenol for pain.  She tells me she is unable to take any NSAIDs per her GI doctor and that she is meeting with a surgeon at [facility] to hopefully undergo what sounds like a Nissen fundoplication in the near future.  Review of Systems as detailed in HPI.  All others reviewed and are negative.   Objective: Vital Signs: LMP  (LMP Unknown)   Physical Exam well-developed well-nourished female no acute distress.  Alert and oriented x 3.  Ortho Exam right ankle exam reveals marked swelling and ecchymosis.  Tenderness throughout.  She is neurovascularly intact distally.  Specialty Comments:  No specialty comments available.  Imaging: No new imaging   PMFS History: Patient Active Problem List   Diagnosis Date Noted   Nausea with vomiting 02/11/2022   Mixed stress and urge urinary incontinence 08/02/2021   Microscopic hematuria 08/02/2021   Palliative care by specialist    Goals of care, counseling/discussion    General weakness    Leukopenia 06/23/2021   Thrombocytopenia 06/23/2021   Elevated d-dimer 06/23/2021   Low back pain 06/23/2021   Hiatal  hernia 06/23/2021   Obesity (BMI 30-39.9) 06/23/2021   Acute bronchospasm 06/23/2021   CAP (community acquired pneumonia) 06/22/2021   GERD (gastroesophageal reflux disease)    Iron deficiency anemia 11/07/2020   Post-op pain 07/12/2020   Splenic lesion 07/12/2020   Status post total replacement of left hip 06/05/2020   Primary osteoarthritis of left hip 02/09/2020   Pain in right knee 02/09/2020   Palliative care patient 09/18/2018   Port-A-Cath in place 08/20/2018   Genetic testing 08/20/2018   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   Splenic laceration 07/31/2018   Family history of uterine cancer    Family history of breast cancer    Macrocytic anemia 07/18/2018   Ovarian cancer,  bilateral 07/18/2018   HCAP (healthcare-associated pneumonia) 07/18/2018   Pleural effusion 07/07/2018   Migraine 07/06/2018   Pleural effusion on left 07/06/2018   Acute respiratory failure with hypoxia 07/06/2018   Tobacco abuse 07/06/2018   Generalized anxiety disorder 05/02/2014   Unspecified constipation 08/10/2013   Lumbosacral spondylosis without myelopathy 10/23/2011   Intractable migraine without aura 10/23/2011   Past Medical History:  Diagnosis Date   Anemia    Anxiety and depression    Arthritis of facet joints at multiple vertebral levels    L5-S1   Constipation    Dyslipidemia    Family history of breast cancer    Family history of uterine cancer    GERD (gastroesophageal reflux disease)    History of hiatal hernia    History of kidney stones    Insomnia    Irritable bowel syndrome    Migraine    Muscle tension headache    Neuropathy of finger    Ovarian carcinoma    ovarian   Plantar fasciitis of right foot    Port-A-Cath in place 08/20/2018    Family History  Problem Relation Age of Onset   Depression Mother    Hypertension Mother    Obesity Mother    Diabetes Mother    Kidney disease Mother    Peripheral vascular disease Father    Atrial fibrillation Father    Crohn's disease Sister    Uterine cancer Sister 23       maternal half sister   COPD Brother    Osteoporosis Brother    Breast cancer Maternal Aunt 35   Colon cancer Neg Hx     Past Surgical History:  Procedure Laterality Date   ABDOMINAL HYSTERECTOMY     BIOPSY  10/27/2020   Procedure: BIOPSY;  Surgeon: Dolores Frame, MD;  [facility];  Service: Gastroenterology;;   CHOLECYSTECTOMY  2008   COLONOSCOPY N/A 08/13/2013   Procedure: COLONOSCOPY;  Surgeon: Malissa Hippo, MD;  [facility];  Service: Endoscopy;  Laterality: N/A;  230-moved to 145 Ann to notify pt   COLONOSCOPY WITH PROPOFOL N/A 11/15/2020   Procedure: COLONOSCOPY WITH PROPOFOL;  Surgeon:  Malissa Hippo, MD;  [facility];  Service: Endoscopy;  Laterality: N/A;  1:40   ESOPHAGEAL DILATION N/A 02/28/2021   Procedure: ESOPHAGEAL DILATION;  Surgeon: Malissa Hippo, MD;  [facility];  Service: Endoscopy;  Laterality: N/A;   ESOPHAGOGASTRODUODENOSCOPY     ESOPHAGOGASTRODUODENOSCOPY (EGD) WITH PROPOFOL N/A 07/20/2018   Procedure: ESOPHAGOGASTRODUODENOSCOPY (EGD) WITH PROPOFOL;  Surgeon: Malissa Hippo, MD;  [facility];  Service: Endoscopy;  Laterality: N/A;  Possible esophageal dilation.   ESOPHAGOGASTRODUODENOSCOPY (EGD) WITH PROPOFOL N/A 10/27/2020   Procedure: ESOPHAGOGASTRODUODENOSCOPY (EGD)  WITH PROPOFOL;  Surgeon: Marguerita Merlesastaneda Mayorga, Reuel Boomaniel, MD;  [facility];  Service: Gastroenterology;  Laterality: N/A;  2:10, pt knows to arrive at 10:15   ESOPHAGOGASTRODUODENOSCOPY (EGD) WITH PROPOFOL N/A 02/28/2021   Procedure: ESOPHAGOGASTRODUODENOSCOPY (EGD) WITH PROPOFOL;  Surgeon: Malissa Hippoehman, Najeeb U, MD;  [facility];  Service: Endoscopy;  Laterality: N/A;  200 ASA 1   ESOPHAGOGASTRODUODENOSCOPY (EGD) WITH PROPOFOL N/A 03/29/2022   Procedure: ESOPHAGOGASTRODUODENOSCOPY (EGD) WITH PROPOFOL;  Surgeon: Dolores Frameastaneda Mayorga, Daniel, MD;  [facility];  Service: Gastroenterology;  Laterality: N/A;  2:00 pm, asa 1-2   GIVENS CAPSULE STUDY N/A 11/24/2020   Procedure: GIVENS CAPSULE STUDY;  Surgeon: Malissa Hippoehman, Najeeb U, MD;  [facility];  Service: Endoscopy;  Laterality: N/A;  7:30   IR ANGIOGRAM SELECTIVE EACH ADDITIONAL VESSEL  08/01/2018   IR ANGIOGRAM VISCERAL SELECTIVE  08/01/2018   IR EMBO ART  VEN HEMORR LYMPH EXTRAV  INC GUIDE ROADMAPPING  08/01/2018   IR IMAGING GUIDED PORT INSERTION  08/20/2018   IR PERC PLEURAL DRAIN W/INDWELL CATH W/IMG GUIDE  07/08/2018   IR THORACENTESIS ASP PLEURAL SPACE W/IMG GUIDE  07/07/2018   IR US GUIDE VASC ACCESS RIGHT  08/01/2018   PLEURAL EFFUSION DRAINAGE Left 07/13/2018   Procedure:  DRAINAGE OF LOCULATED PLEURAL EFFUSION;  Surgeon: Kerin PernaVan Trigt, Peter, MD;  [facility];  Service: Thoracic;  Laterality: Left;   POLYPECTOMY  11/15/2020   Procedure: POLYPECTOMY;  Surgeon: Malissa Hippoehman, Najeeb U, MD;  [facility];  Service: Endoscopy;;   REMOVAL OF PLEURAL DRAINAGE CATHETER Left 08/20/2018   Procedure: REMOVAL OF PLEURAL DRAINAGE CATHETER;  Surgeon: Kerin PernaVan Trigt, Peter, MD;  [facility];  Service: Thoracic;  Laterality: Left;   REMOVAL OF PLEURAL DRAINAGE CATHETER Left 08/20/2018   Procedure: REMOVAL OF PLEURAL DRAINAGE CATHETER;  Surgeon: Kerin PernaVan Trigt, Peter, MD;  [facility];  Service: Thoracic;  Laterality: Left;   TALC PLEURODESIS Left 07/13/2018   Procedure: Talc Pleuradesis;  Surgeon: Donata ClayVan Trigt, Theron AristaPeter, MD;  [facility];  Service: Thoracic;  Laterality: Left;   TOTAL HIP ARTHROPLASTY Left 06/05/2020   Procedure: LEFT TOTAL HIP ARTHROPLASTY ANTERIOR APPROACH;  Surgeon: Tarry KosXu, Tramar Brueckner M, MD;  [facility]: [facility];  Service: Orthopedics;  Laterality: Left;  3-C   TUBAL LIGATION Bilateral    UTERINE ABLATION     VIDEO ASSISTED THORACOSCOPY Left 07/13/2018   Procedure: VIDEO ASSISTED THORACOSCOPY;  Surgeon: Donata ClayVan Trigt, Theron AristaPeter, MD;  [facility];  Service: Thoracic;  Laterality: Left;   Social History   Occupational History    Employer: [employer]  Tobacco Use   Smoking status: Former    Packs/day: 0.50    Years: 17.00    Additional pack years: 0.00    Total pack years: 8.50    Types: Cigarettes    Quit date: 06/22/2018    Years since quitting: 3.8   Smokeless tobacco: Never  Vaping Use   Vaping Use: Never used  Substance and Sexual Activity   Alcohol use: Yes    Alcohol/week: 1.0 standard drink of alcohol    Types: 1 Glasses of wine per week    Comment: occasionally   Drug use: No   Sexual activity: Not Currently

## 2022-04-24 NOTE — Telephone Encounter (Signed)
Please advise 

## 2022-04-26 ENCOUNTER — Other Ambulatory Visit (INDEPENDENT_AMBULATORY_CARE_PROVIDER_SITE_OTHER): Payer: Self-pay | Admitting: Gastroenterology

## 2022-04-26 ENCOUNTER — Other Ambulatory Visit: Payer: Self-pay | Admitting: Hematology

## 2022-04-29 ENCOUNTER — Other Ambulatory Visit: Payer: Self-pay

## 2022-04-29 ENCOUNTER — Other Ambulatory Visit: Payer: Self-pay | Admitting: *Deleted

## 2022-04-29 DIAGNOSIS — T451X5A Adverse effect of antineoplastic and immunosuppressive drugs, initial encounter: Secondary | ICD-10-CM

## 2022-04-29 MED ORDER — PROCHLORPERAZINE MALEATE 10 MG PO TABS: 10.0000 mg | ORAL_TABLET | Freq: Two times a day (BID) | ORAL | 3 refills | Status: DC | PRN

## 2022-05-01 ENCOUNTER — Other Ambulatory Visit: Payer: Self-pay

## 2022-05-01 ENCOUNTER — Other Ambulatory Visit: Payer: Self-pay | Admitting: Hematology

## 2022-05-01 ENCOUNTER — Other Ambulatory Visit: Payer: Self-pay | Admitting: *Deleted

## 2022-05-01 ENCOUNTER — Other Ambulatory Visit (HOSPITAL_COMMUNITY): Payer: Self-pay

## 2022-05-01 MED ORDER — OXYCODONE HCL 20 MG PO TABS: 1.0000 | ORAL_TABLET | ORAL | 0 refills | Status: DC | PRN

## 2022-05-01 MED ORDER — GABAPENTIN 100 MG PO CAPS: ORAL_CAPSULE | ORAL | 0 refills | Status: DC

## 2022-05-01 MED ORDER — OLAPARIB 150 MG PO TABS
300.0000 mg | ORAL_TABLET | Freq: Two times a day (BID) | ORAL | 2 refills | Status: DC
  Filled 2022-05-01: qty 120, 30d supply, fill #0
  Filled 2022-06-04: qty 120, 30d supply, fill #1
  Filled 2022-06-28: qty 120, 30d supply, fill #2

## 2022-05-01 NOTE — Telephone Encounter (Signed)
[facility] refill approved. Patient is tolerating and is to continue therapy.

## 2022-05-05 NOTE — Progress Notes (Unsigned)
Office Visit Note   Patient: Michelle Holt           Date of Birth: 09/30/1970           MRN: [id number] Visit Date: 05/07/2022              Requested by: Ignatius Specking, MD [address] [place],  [place] [address] PCP: Ignatius Specking, MD   Assessment & Plan: Visit Diagnoses:  1. Pain in right ankle and joints of right foot     Plan: Impression is 2 weeks status post minimally displaced right lateral malleolus fracture.  We will continue with nonoperative treatment.  Continue nonweightbearing.  Xarelto was refilled for another 2 weeks.  She may begin general ankle range of motion exercises.  Recheck in 2 weeks with three-view x-rays of the right ankle.  I anticipate allowing weightbearing at that time and referral to outpatient PT for gait training.  Follow-Up Instructions: Return in about 2 weeks (around 05/21/2022).   Orders:  Orders Placed This Encounter  Procedures   XR Ankle Complete Right   Meds ordered this encounter  Medications   rivaroxaban (XARELTO) 10 MG TABS tablet    Sig: Take 1 tablet (10 mg total) by mouth daily.    Dispense:  14 tablet    Refill:  0      Procedures: No procedures performed   Clinical Data: No additional findings.   Subjective: Chief Complaint  Patient presents with   Right Ankle - Follow-up    HPI  Michelle Holt returns today 2 weeks status post right ankle fracture.  Overall feels better.  Review of Systems   Objective: Vital Signs: LMP  (LMP Unknown)   Physical Exam  Ortho Exam  Examination right ankle shows improvement in swelling.  No significant tenderness.  Specialty Comments:  No specialty comments available.  Imaging: XR Ankle Complete Right  Result Date: 05/07/2022 X-rays demonstrate stable appearance of minimally displaced lateral malleolus fracture.    PMFS History: Patient Active Problem List   Diagnosis Date Noted   Nausea with vomiting 02/11/2022   Mixed stress and urge urinary incontinence 08/02/2021    Microscopic hematuria 08/02/2021   Palliative care by specialist    Goals of care, counseling/discussion    General weakness    Leukopenia 06/23/2021   Thrombocytopenia 06/23/2021   Elevated d-dimer 06/23/2021   Low back pain 06/23/2021   Hiatal hernia 06/23/2021   Obesity (BMI 30-39.9) 06/23/2021   Acute bronchospasm 06/23/2021   CAP (community acquired pneumonia) 06/22/2021   GERD (gastroesophageal reflux disease)    Iron deficiency anemia 11/07/2020   Post-op pain 07/12/2020   Splenic lesion 07/12/2020   Status post total replacement of left hip 06/05/2020   Primary osteoarthritis of left hip 02/09/2020   Pain in right knee 02/09/2020   Palliative care patient 09/18/2018   Port-A-Cath in place 08/20/2018   Genetic testing 08/20/2018   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   Splenic laceration 07/31/2018   Family history of uterine cancer    Family history of breast cancer    Macrocytic anemia 07/18/2018   Ovarian cancer, bilateral 07/18/2018   HCAP (healthcare-associated pneumonia) 07/18/2018   Pleural effusion 07/07/2018   Migraine 07/06/2018   Pleural effusion on left 07/06/2018   Acute respiratory failure with hypoxia 07/06/2018   Tobacco abuse 07/06/2018   Generalized anxiety disorder 05/02/2014   Unspecified constipation 08/10/2013   Lumbosacral spondylosis without myelopathy 10/23/2011   Intractable migraine without aura  10/23/2011   Past Medical History:  Diagnosis Date   Anemia    Anxiety and depression    Arthritis of facet joints at multiple vertebral levels    L5-S1   Constipation    Dyslipidemia    Family history of breast cancer    Family history of uterine cancer    GERD (gastroesophageal reflux disease)    History of hiatal hernia    History of kidney stones    Insomnia    Irritable bowel syndrome    Migraine    Muscle tension headache    Neuropathy of finger    Ovarian carcinoma    ovarian   Plantar fasciitis of right  foot    Port-A-Cath in place 08/20/2018    Family History  Problem Relation Age of Onset   Depression Mother    Hypertension Mother    Obesity Mother    Diabetes Mother    Kidney disease Mother    Peripheral vascular disease Father    Atrial fibrillation Father    Crohn's disease Sister    Uterine cancer Sister 56       maternal half sister   COPD Brother    Osteoporosis Brother    Breast cancer Maternal Aunt 21   Colon cancer Neg Hx     Past Surgical History:  Procedure Laterality Date   ABDOMINAL HYSTERECTOMY     BIOPSY  10/27/2020   Procedure: BIOPSY;  Surgeon: Dolores Frame, MD;  [facility];  Service: Gastroenterology;;   CHOLECYSTECTOMY  2008   COLONOSCOPY N/A 08/13/2013   Procedure: COLONOSCOPY;  Surgeon: Malissa Hippo, MD;  [facility];  Service: Endoscopy;  Laterality: N/A;  230-moved to 145 Ann to notify pt   COLONOSCOPY WITH PROPOFOL N/A 11/15/2020   Procedure: COLONOSCOPY WITH PROPOFOL;  Surgeon: Malissa Hippo, MD;  [facility];  Service: Endoscopy;  Laterality: N/A;  1:40   ESOPHAGEAL DILATION N/A 02/28/2021   Procedure: ESOPHAGEAL DILATION;  Surgeon: Malissa Hippo, MD;  [facility];  Service: Endoscopy;  Laterality: N/A;   ESOPHAGOGASTRODUODENOSCOPY     ESOPHAGOGASTRODUODENOSCOPY (EGD) WITH PROPOFOL N/A 07/20/2018   Procedure: ESOPHAGOGASTRODUODENOSCOPY (EGD) WITH PROPOFOL;  Surgeon: Malissa Hippo, MD;  [facility];  Service: Endoscopy;  Laterality: N/A;  Possible esophageal dilation.   ESOPHAGOGASTRODUODENOSCOPY (EGD) WITH PROPOFOL N/A 10/27/2020   Procedure: ESOPHAGOGASTRODUODENOSCOPY (EGD) WITH PROPOFOL;  Surgeon: Dolores Frame, MD;  [facility];  Service: Gastroenterology;  Laterality: N/A;  2:10, pt knows to arrive at 10:15   ESOPHAGOGASTRODUODENOSCOPY (EGD) WITH PROPOFOL N/A 02/28/2021   Procedure: ESOPHAGOGASTRODUODENOSCOPY (EGD) WITH PROPOFOL;  Surgeon:  Malissa Hippo, MD;  [facility];  Service: Endoscopy;  Laterality: N/A;  200 ASA 1   ESOPHAGOGASTRODUODENOSCOPY (EGD) WITH PROPOFOL N/A 03/29/2022   Procedure: ESOPHAGOGASTRODUODENOSCOPY (EGD) WITH PROPOFOL;  Surgeon: Dolores Frame, MD;  [facility];  Service: Gastroenterology;  Laterality: N/A;  2:00 pm, asa 1-2   GIVENS CAPSULE STUDY N/A 11/24/2020   Procedure: GIVENS CAPSULE STUDY;  Surgeon: Malissa Hippo, MD;  [facility];  Service: Endoscopy;  Laterality: N/A;  7:30   IR ANGIOGRAM SELECTIVE EACH ADDITIONAL VESSEL  08/01/2018   IR ANGIOGRAM VISCERAL SELECTIVE  08/01/2018   IR EMBO ART  VEN HEMORR LYMPH EXTRAV  INC GUIDE ROADMAPPING  08/01/2018   IR IMAGING GUIDED PORT INSERTION  08/20/2018   IR PERC PLEURAL DRAIN W/INDWELL CATH W/IMG GUIDE  07/08/2018   IR THORACENTESIS ASP PLEURAL SPACE W/IMG GUIDE  07/07/2018   IR US GUIDE VASC ACCESS RIGHT  08/01/2018   PLEURAL EFFUSION DRAINAGE Left 07/13/2018   Procedure: DRAINAGE OF LOCULATED PLEURAL EFFUSION;  Surgeon: Kerin Perna, MD;  Location: [facility] OR;  Service: Thoracic;  Laterality: Left;   POLYPECTOMY  11/15/2020   Procedure: POLYPECTOMY;  Surgeon: Malissa Hippo, MD;  [facility];  Service: Endoscopy;;   REMOVAL OF PLEURAL DRAINAGE CATHETER Left 08/20/2018   Procedure: REMOVAL OF PLEURAL DRAINAGE CATHETER;  Surgeon: Kerin Perna, MD;  Location[facility] OR;  Service: Thoracic;  Laterality: Left;   REMOVAL OF PLEURAL DRAINAGE CATHETER Left 08/20/2018   Procedure: REMOVAL OF PLEURAL DRAINAGE CATHETER;  Surgeon: Kerin Perna, MD;  Location: [facility] OR;  Service: Thoracic;  Laterality: Left;   TALC PLEURODESIS Left 07/13/2018   Procedure: Talc Pleuradesis;  Surgeon: Donata Clay, Theron Arista, MD;  Location: [facility] OR;  Service: Thoracic;  Laterality: Left;   TOTAL HIP ARTHROPLASTY Left 06/05/2020   Procedure: LEFT TOTAL HIP ARTHROPLASTY ANTERIOR APPROACH;  Surgeon: Tarry Kos, MD;  Location: [facility];   Service: Orthopedics;  Laterality: Left;  3-C   TUBAL LIGATION Bilateral    UTERINE ABLATION     VIDEO ASSISTED THORACOSCOPY Left 07/13/2018   Procedure: VIDEO ASSISTED THORACOSCOPY;  Surgeon: Donata Clay, Theron Arista, MD;  Location: [facility] OR;  Service: Thoracic;  Laterality: Left;   Social History   Occupational History    Employer: [employer]  Tobacco Use   Smoking status: Former    Packs/day: 0.50    Years: 17.00    Additional pack years: 0.00    Total pack years: 8.50    Types: Cigarettes    Quit date: 06/22/2018    Years since quitting: 3.8   Smokeless tobacco: Never  Vaping Use   Vaping Use: Never used  Substance and Sexual Activity   Alcohol use: Yes    Alcohol/week: 1.0 standard drink of alcohol    Types: 1 Glasses of wine per week    Comment: occasionally   Drug use: No   Sexual activity: Not Currently

## 2022-05-06 ENCOUNTER — Other Ambulatory Visit: Payer: Self-pay

## 2022-05-07 ENCOUNTER — Encounter: Payer: Self-pay | Admitting: Orthopaedic Surgery

## 2022-05-07 ENCOUNTER — Ambulatory Visit (INDEPENDENT_AMBULATORY_CARE_PROVIDER_SITE_OTHER): Payer: 59 | Admitting: Orthopaedic Surgery

## 2022-05-07 ENCOUNTER — Other Ambulatory Visit (INDEPENDENT_AMBULATORY_CARE_PROVIDER_SITE_OTHER): Payer: 59

## 2022-05-07 ENCOUNTER — Encounter (HOSPITAL_COMMUNITY): Payer: Self-pay | Admitting: Hematology

## 2022-05-07 DIAGNOSIS — M25571 Pain in right ankle and joints of right foot: Secondary | ICD-10-CM | POA: Diagnosis not present

## 2022-05-07 MED ORDER — RIVAROXABAN 10 MG PO TABS: 10.0000 mg | ORAL_TABLET | Freq: Every day | ORAL | 0 refills | Status: DC

## 2022-05-21 ENCOUNTER — Ambulatory Visit (INDEPENDENT_AMBULATORY_CARE_PROVIDER_SITE_OTHER): Payer: 59 | Admitting: Physician Assistant

## 2022-05-21 ENCOUNTER — Encounter: Payer: Self-pay | Admitting: Physician Assistant

## 2022-05-21 ENCOUNTER — Other Ambulatory Visit (INDEPENDENT_AMBULATORY_CARE_PROVIDER_SITE_OTHER): Payer: 59

## 2022-05-21 DIAGNOSIS — M25571 Pain in right ankle and joints of right foot: Secondary | ICD-10-CM

## 2022-05-21 NOTE — Progress Notes (Signed)
Office Visit Note   Patient: Michelle Holt           Date of Birth: 15-Aug-1970           MRN: [id number] Visit Date: 05/21/2022              Requested by: Ignatius Specking, MD [address] [place],  [place] [address] PCP: Ignatius Specking, MD   Assessment & Plan: Visit Diagnoses:  1. Pain in right ankle and joints of right foot     Plan: Patient is a pleasant 52 year old female who comes in today 4 weeks status post right lateral malleolus ankle fracture.  She has been compliant nonweightbearing in a cam boot.  She is taking Xarelto for DVT prophylaxis.  She is taking narcotic pain medication that she is getting from her oncologist.  Overall, she feels she is doing better.  Examination of the right ankle reveals mild swelling.  Moderate tenderness to the fracture site.  Painless range of motion of the ankle.  Calf is soft nontender.  She is neurovascular intact distally.  At this point, she is clinically and radiographically improving.  Will let her weight-bear as tolerated in her cam boot over the next 2 weeks.  Continue to ice and elevate for pain and swelling.  I have sent in an outpatient referral for physical therapy.  She will follow-up with [place] in 2 weeks time for repeat evaluation and three-view x-rays of the right ankle.  Anticipate transitioning into an ASO at that point.  Follow-Up Instructions: Return in about 2 weeks (around 06/04/2022).   Orders:  Orders Placed This Encounter  Procedures   XR Ankle Complete Right   No orders of the defined types were placed in this encounter.     Procedures: No procedures performed   Clinical Data: No additional findings.   Subjective: Chief Complaint  Patient presents with   Right Ankle - Follow-up         Objective: Vital Signs: LMP  (LMP Unknown)       Specialty Comments:  No specialty comments available.  Imaging: XR Ankle Complete Right  Result Date: 05/21/2022 X-rays demonstrate consolidation of the fracture site     PMFS History: Patient Active Problem List   Diagnosis Date Noted   Nausea with vomiting 02/11/2022   Mixed stress and urge urinary incontinence 08/02/2021   Microscopic hematuria 08/02/2021   Palliative care by specialist    Goals of care, counseling/discussion    General weakness    Leukopenia 06/23/2021   Thrombocytopenia (HCC) 06/23/2021   Elevated d-dimer 06/23/2021   Low back pain 06/23/2021   Hiatal hernia 06/23/2021   Obesity (BMI 30-39.9) 06/23/2021   Acute bronchospasm 06/23/2021   CAP (community acquired pneumonia) 06/22/2021   GERD (gastroesophageal reflux disease)    Iron deficiency anemia 11/07/2020   Post-op pain 07/12/2020   Splenic lesion 07/12/2020   Status post total replacement of left hip 06/05/2020   Primary osteoarthritis of left hip 02/09/2020   Pain in right knee 02/09/2020   Palliative care patient 09/18/2018   Port-A-Cath in place 08/20/2018   Genetic testing 08/20/2018   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   Splenic laceration 07/31/2018   Family history of uterine cancer    Family history of breast cancer    Macrocytic anemia 07/18/2018   Ovarian cancer, bilateral (HCC) 07/18/2018   HCAP (healthcare-associated pneumonia) 07/18/2018   Pleural effusion 07/07/2018   Migraine 07/06/2018   Pleural  effusion on left 07/06/2018   Acute respiratory failure with hypoxia (HCC) 07/06/2018   Tobacco abuse 07/06/2018   Generalized anxiety disorder 05/02/2014   Unspecified constipation 08/10/2013   Lumbosacral spondylosis without myelopathy 10/23/2011   Intractable migraine without aura 10/23/2011   Past Medical History:  Diagnosis Date   Anemia    Anxiety and depression    Arthritis of facet joints at multiple vertebral levels    L5-S1   Constipation    Dyslipidemia    Family history of breast cancer    Family history of uterine cancer    GERD (gastroesophageal reflux disease)    History of hiatal hernia     History of kidney stones    Insomnia    Irritable bowel syndrome    Migraine    Muscle tension headache    Neuropathy of finger    Ovarian carcinoma (HCC)    ovarian   Plantar fasciitis of right foot    Port-A-Cath in place 08/20/2018    Family History  Problem Relation Age of Onset   Depression Mother    Hypertension Mother    Obesity Mother    Diabetes Mother    Kidney disease Mother    Peripheral vascular disease Father    Atrial fibrillation Father    Crohn's disease Sister    Uterine cancer Sister 20       maternal half sister   COPD Brother    Osteoporosis Brother    Breast cancer Maternal Aunt 22   Colon cancer Neg Hx     Past Surgical History:  Procedure Laterality Date   ABDOMINAL HYSTERECTOMY     BIOPSY  10/27/2020   Procedure: BIOPSY;  Surgeon: Dolores Frame, MD;  [facility];  Service: Gastroenterology;;   CHOLECYSTECTOMY  2008   COLONOSCOPY N/A 08/13/2013   Procedure: COLONOSCOPY;  Surgeon: Malissa Hippo, MD;  [facility];  Service: Endoscopy;  Laterality: N/A;  230-moved to 145 Ann to notify pt   COLONOSCOPY WITH PROPOFOL N/A 11/15/2020   Procedure: COLONOSCOPY WITH PROPOFOL;  Surgeon: Malissa Hippo, MD;  [facility];  Service: Endoscopy;  Laterality: N/A;  1:40   ESOPHAGEAL DILATION N/A 02/28/2021   Procedure: ESOPHAGEAL DILATION;  Surgeon: Malissa Hippo, MD;  [facility];  Service: Endoscopy;  Laterality: N/A;   ESOPHAGOGASTRODUODENOSCOPY     ESOPHAGOGASTRODUODENOSCOPY (EGD) WITH PROPOFOL N/A 07/20/2018   Procedure: ESOPHAGOGASTRODUODENOSCOPY (EGD) WITH PROPOFOL;  Surgeon: Malissa Hippo, MD;  [facility];  Service: Endoscopy;  Laterality: N/A;  Possible esophageal dilation.   ESOPHAGOGASTRODUODENOSCOPY (EGD) WITH PROPOFOL N/A 10/27/2020   Procedure: ESOPHAGOGASTRODUODENOSCOPY (EGD) WITH PROPOFOL;  Surgeon: Dolores Frame, MD;  [facility];  Service:  Gastroenterology;  Laterality: N/A;  2:10, pt knows to arrive at 10:15   ESOPHAGOGASTRODUODENOSCOPY (EGD) WITH PROPOFOL N/A 02/28/2021   Procedure: ESOPHAGOGASTRODUODENOSCOPY (EGD) WITH PROPOFOL;  Surgeon: Malissa Hippo, MD;  [facility];  Service: Endoscopy;  Laterality: N/A;  200 ASA 1   ESOPHAGOGASTRODUODENOSCOPY (EGD) WITH PROPOFOL N/A 03/29/2022   Procedure: ESOPHAGOGASTRODUODENOSCOPY (EGD) WITH PROPOFOL;  Surgeon: Dolores Frame, MD;  [facility];  Service: Gastroenterology;  Laterality: N/A;  2:00 pm, asa 1-2   GIVENS CAPSULE STUDY N/A 11/24/2020   Procedure: GIVENS CAPSULE STUDY;  Surgeon: Malissa Hippo, MD;  [facility];  Service: Endoscopy;  Laterality: N/A;  7:30   IR ANGIOGRAM SELECTIVE EACH ADDITIONAL VESSEL  08/01/2018  IR ANGIOGRAM VISCERAL SELECTIVE  08/01/2018   IR EMBO ART  VEN HEMORR LYMPH EXTRAV  INC GUIDE ROADMAPPING  08/01/2018   IR IMAGING GUIDED PORT INSERTION  08/20/2018   IR PERC PLEURAL DRAIN W/INDWELL CATH W/IMG GUIDE  07/08/2018   IR THORACENTESIS ASP PLEURAL SPACE W/IMG GUIDE  07/07/2018   IR US GUIDE VASC ACCESS RIGHT  08/01/2018   PLEURAL EFFUSION DRAINAGE Left 07/13/2018   Procedure: DRAINAGE OF LOCULATED PLEURAL EFFUSION;  Surgeon: Kerin Perna, MD;  Location: [facility] OR;  Service: Thoracic;  Laterality: Left;   POLYPECTOMY  11/15/2020   Procedure: POLYPECTOMY;  Surgeon: Malissa Hippo, MD;  [facility];  Service: Endoscopy;;   REMOVAL OF PLEURAL DRAINAGE CATHETER Left 08/20/2018   Procedure: REMOVAL OF PLEURAL DRAINAGE CATHETER;  Surgeon: Kerin Perna, MD;  Location: [facility] OR;  Service: Thoracic;  Laterality: Left;   REMOVAL OF PLEURAL DRAINAGE CATHETER Left 08/20/2018   Procedure: REMOVAL OF PLEURAL DRAINAGE CATHETER;  Surgeon: Kerin Perna, MD;  Location: [facility] OR;  Service: Thoracic;  Laterality: Left;   TALC PLEURODESIS Left 07/13/2018   Procedure: Talc Pleuradesis;  Surgeon: Donata Clay, Theron Arista, MD;   Location: [facility] OR;  Service: Thoracic;  Laterality: Left;   TOTAL HIP ARTHROPLASTY Left 06/05/2020   Procedure: LEFT TOTAL HIP ARTHROPLASTY ANTERIOR APPROACH;  Surgeon: Tarry Kos, MD;  Location: [facility] OR;  Service: Orthopedics;  Laterality: Left;  3-C   TUBAL LIGATION Bilateral    UTERINE ABLATION     VIDEO ASSISTED THORACOSCOPY Left 07/13/2018   Procedure: VIDEO ASSISTED THORACOSCOPY;  Surgeon: Donata Clay, Theron Arista, MD;  Location: [facility] OR;  Service: Thoracic;  Laterality: Left;   Social History   Occupational History    Employer: [employer]  Tobacco Use   Smoking status: Former    Packs/day: 0.50    Years: 17.00    Additional pack years: 0.00    Total pack years: 8.50    Types: Cigarettes    Quit date: 06/22/2018    Years since quitting: 3.9   Smokeless tobacco: Never  Vaping Use   Vaping Use: Never used  Substance and Sexual Activity   Alcohol use: Yes    Alcohol/week: 1.0 standard drink of alcohol    Types: 1 Glasses of wine per week    Comment: occasionally   Drug use: No   Sexual activity: Not Currently

## 2022-05-23 ENCOUNTER — Other Ambulatory Visit: Payer: Self-pay | Admitting: Hematology

## 2022-05-23 DIAGNOSIS — R232 Flushing: Secondary | ICD-10-CM

## 2022-05-24 ENCOUNTER — Other Ambulatory Visit: Payer: Self-pay

## 2022-05-24 MED ORDER — OXYCODONE HCL 20 MG PO TABS: 1.0000 | ORAL_TABLET | ORAL | 0 refills | Status: DC | PRN

## 2022-05-27 ENCOUNTER — Inpatient Hospital Stay: Payer: 59 | Attending: Hematology

## 2022-05-27 ENCOUNTER — Other Ambulatory Visit: Payer: Self-pay

## 2022-05-27 VITALS — BP 124/79 | HR 91 | Resp 18

## 2022-05-27 DIAGNOSIS — C786 Secondary malignant neoplasm of retroperitoneum and peritoneum: Secondary | ICD-10-CM | POA: Insufficient documentation

## 2022-05-27 DIAGNOSIS — Z87891 Personal history of nicotine dependence: Secondary | ICD-10-CM | POA: Insufficient documentation

## 2022-05-27 DIAGNOSIS — D649 Anemia, unspecified: Secondary | ICD-10-CM

## 2022-05-27 DIAGNOSIS — D509 Iron deficiency anemia, unspecified: Secondary | ICD-10-CM | POA: Insufficient documentation

## 2022-05-27 DIAGNOSIS — C563 Malignant neoplasm of bilateral ovaries: Secondary | ICD-10-CM | POA: Diagnosis not present

## 2022-05-27 DIAGNOSIS — Z90710 Acquired absence of both cervix and uterus: Secondary | ICD-10-CM | POA: Insufficient documentation

## 2022-05-27 DIAGNOSIS — Z7901 Long term (current) use of anticoagulants: Secondary | ICD-10-CM | POA: Insufficient documentation

## 2022-05-27 DIAGNOSIS — Z9221 Personal history of antineoplastic chemotherapy: Secondary | ICD-10-CM | POA: Diagnosis not present

## 2022-05-27 DIAGNOSIS — Z79899 Other long term (current) drug therapy: Secondary | ICD-10-CM | POA: Diagnosis not present

## 2022-05-27 DIAGNOSIS — Z95828 Presence of other vascular implants and grafts: Secondary | ICD-10-CM

## 2022-05-27 LAB — CBC WITH DIFFERENTIAL/PLATELET
Abs Immature Granulocytes: 0.02 10*3/uL (ref 0.00–0.07)
Basophils Absolute: 0 10*3/uL (ref 0.0–0.1)
Basophils Relative: 1 %
Eosinophils Absolute: 0.2 10*3/uL (ref 0.0–0.5)
Eosinophils Relative: 2 %
HCT: 37.5 % (ref 36.0–46.0)
Hemoglobin: 12.7 g/dL (ref 12.0–15.0)
Immature Granulocytes: 0 %
Lymphocytes Relative: 32 %
Lymphs Abs: 2 10*3/uL (ref 0.7–4.0)
MCH: 39.6 pg — ABNORMAL HIGH (ref 26.0–34.0)
MCHC: 33.9 g/dL (ref 30.0–36.0)
MCV: 116.8 fL — ABNORMAL HIGH (ref 80.0–100.0)
Monocytes Absolute: 0.4 10*3/uL (ref 0.1–1.0)
Monocytes Relative: 7 %
Neutro Abs: 3.8 10*3/uL (ref 1.7–7.7)
Neutrophils Relative %: 58 %
Platelets: 290 10*3/uL (ref 150–400)
RBC: 3.21 MIL/uL — ABNORMAL LOW (ref 3.87–5.11)
RDW: 13.4 % (ref 11.5–15.5)
WBC: 6.4 10*3/uL (ref 4.0–10.5)
nRBC: 0.5 % — ABNORMAL HIGH (ref 0.0–0.2)

## 2022-05-27 LAB — COMPREHENSIVE METABOLIC PANEL
ALT: 21 U/L (ref 0–44)
AST: 22 U/L (ref 15–41)
Albumin: 3.9 g/dL (ref 3.5–5.0)
Alkaline Phosphatase: 84 U/L (ref 38–126)
Anion gap: 10 (ref 5–15)
BUN: 9 mg/dL (ref 6–20)
CO2: 26 mmol/L (ref 22–32)
Calcium: 9.1 mg/dL (ref 8.9–10.3)
Chloride: 101 mmol/L (ref 98–111)
Creatinine, Ser: 0.66 mg/dL (ref 0.44–1.00)
GFR, Estimated: >60 mL/min (ref >60)
Glucose, Bld: 137 mg/dL — ABNORMAL HIGH (ref 70–99)
Potassium: 3.5 mmol/L (ref 3.5–5.1)
Sodium: 137 mmol/L (ref 135–145)
Total Bilirubin: 0.3 mg/dL (ref 0.3–1.2)
Total Protein: 7.2 g/dL (ref 6.5–8.1)

## 2022-05-27 LAB — IRON AND TIBC
Iron: 48 ug/dL (ref 28–170)
Saturation Ratios: 14 % (ref 10.4–31.8)
TIBC: 346 ug/dL (ref 250–450)
UIBC: 298 ug/dL

## 2022-05-27 LAB — FERRITIN: Ferritin: 67 ng/mL (ref 11–307)

## 2022-05-27 MED ORDER — SODIUM CHLORIDE 0.9% FLUSH
10.0000 mL | Freq: Once | INTRAVENOUS | Status: AC
  Administered 2022-05-27: 10 mL via INTRAVENOUS

## 2022-05-27 MED ORDER — HEPARIN SOD (PORK) LOCK FLUSH 100 UNIT/ML IV SOLN
500.0000 [IU] | Freq: Once | INTRAVENOUS | Status: AC
  Administered 2022-05-27: 500 [IU] via INTRAVENOUS

## 2022-05-27 NOTE — Patient Instructions (Signed)
[facility]  Discharge Instructions: Thank you for choosing [facility] to provide your oncology and hematology care.  If you have a lab appointment with the Cancer Center - please note that after April 8th, 2024, all labs will be drawn in the cancer center.  You do not have to check in or register with the main entrance as you have in the past but will complete your check-in in the cancer center.  Wear comfortable clothing and clothing appropriate for easy access to any Portacath or PICC line.   We strive to give you quality time with your provider. You may need to reschedule your appointment if you arrive late (15 or more minutes).  Arriving late affects you and other patients whose appointments are after yours.  Also, if you miss three or more appointments without notifying the office, you may be dismissed from the clinic at the provider's discretion.      For prescription refill requests, have your pharmacy contact our office and allow 72 hours for refills to be completed.    Today you received the following port flush with lab, return as scheduled.  To help prevent nausea and vomiting after your treatment, we encourage you to take your nausea medication as directed.  BELOW ARE SYMPTOMS THAT SHOULD BE REPORTED IMMEDIATELY: *FEVER GREATER THAN 100.4 F (38 C) OR HIGHER *CHILLS OR SWEATING *NAUSEA AND VOMITING THAT IS NOT CONTROLLED WITH YOUR NAUSEA MEDICATION *UNUSUAL SHORTNESS OF BREATH *UNUSUAL BRUISING OR BLEEDING *URINARY PROBLEMS (pain or burning when urinating, or frequent urination) *BOWEL PROBLEMS (unusual diarrhea, constipation, pain near the anus) TENDERNESS IN MOUTH AND THROAT WITH OR WITHOUT PRESENCE OF ULCERS (sore throat, sores in mouth, or a toothache) UNUSUAL RASH, SWELLING OR PAIN  UNUSUAL VAGINAL DISCHARGE OR ITCHING   Items with * indicate a potential emergency and should be followed up as soon as possible or go to the Emergency  Department if any problems should occur.  Please show the CHEMOTHERAPY ALERT CARD or IMMUNOTHERAPY ALERT CARD at check-in to the Emergency Department and triage nurse.  Should you have questions after your visit or need to cancel or reschedule your appointment, please contact [facility] [phone]  and follow the prompts.  Office hours are 8:00 a.m. to 4:30 p.m. Monday - Friday. Please note that voicemails left after 4:00 p.m. may not be returned until the following business day.  We are closed weekends and major holidays. You have access to a nurse at all times for urgent questions. Please call the main number to the clinic [phone] and follow the prompts.  For any non-urgent questions, you may also contact your provider using MyChart. We now offer e-Visits for anyone 18 and older to request care online for non-urgent symptoms. For details visit [email or web address].   Also download the MyChart app! Go to the app store, search "MyChart", open the app, select [facility], and log in with your MyChart username and password.   

## 2022-05-27 NOTE — Progress Notes (Signed)
Port flushed with good blood return noted. No bruising or swelling at site. Bandaid applied and patient discharged in satisfactory condition. VVS stable with no signs or symptoms of distressed noted. 

## 2022-05-28 ENCOUNTER — Other Ambulatory Visit (HOSPITAL_COMMUNITY): Payer: Self-pay

## 2022-05-28 DIAGNOSIS — M79671 Pain in right foot: Secondary | ICD-10-CM | POA: Diagnosis not present

## 2022-05-28 DIAGNOSIS — M25571 Pain in right ankle and joints of right foot: Secondary | ICD-10-CM | POA: Diagnosis not present

## 2022-05-29 LAB — CA 125: Cancer Antigen (CA) 125: 6.8 U/mL (ref 0.0–38.1)

## 2022-06-03 ENCOUNTER — Inpatient Hospital Stay (HOSPITAL_BASED_OUTPATIENT_CLINIC_OR_DEPARTMENT_OTHER): Payer: 59 | Admitting: Hematology

## 2022-06-03 ENCOUNTER — Other Ambulatory Visit (HOSPITAL_COMMUNITY): Payer: Self-pay

## 2022-06-03 VITALS — BP 108/66 | HR 86 | Temp 98.2°F | Resp 17

## 2022-06-03 DIAGNOSIS — D509 Iron deficiency anemia, unspecified: Secondary | ICD-10-CM | POA: Diagnosis not present

## 2022-06-03 DIAGNOSIS — Z79899 Other long term (current) drug therapy: Secondary | ICD-10-CM | POA: Diagnosis not present

## 2022-06-03 DIAGNOSIS — D649 Anemia, unspecified: Secondary | ICD-10-CM | POA: Diagnosis not present

## 2022-06-03 DIAGNOSIS — Z9221 Personal history of antineoplastic chemotherapy: Secondary | ICD-10-CM | POA: Diagnosis not present

## 2022-06-03 DIAGNOSIS — C563 Malignant neoplasm of bilateral ovaries: Secondary | ICD-10-CM

## 2022-06-03 DIAGNOSIS — C786 Secondary malignant neoplasm of retroperitoneum and peritoneum: Secondary | ICD-10-CM | POA: Diagnosis not present

## 2022-06-03 DIAGNOSIS — Z87891 Personal history of nicotine dependence: Secondary | ICD-10-CM | POA: Diagnosis not present

## 2022-06-03 DIAGNOSIS — Z7901 Long term (current) use of anticoagulants: Secondary | ICD-10-CM | POA: Diagnosis not present

## 2022-06-03 NOTE — Patient Instructions (Signed)
[facility]  Discharge Instructions  You were seen and examined today by Dr. Ellin Saba.  Dr. Ellin Saba discussed your most recent lab work and CT scan which revealed that everything looks good except your iron is low.  Dr. Ellin Saba is going to schedule you to have iron infusion. He is also going to schedule you for repeat CT of your Abdomen and Pelvis.  Follow-up as scheduled in 2 months.    Thank you for choosing  [facility] to provide your oncology and hematology care.   To afford each patient quality time with our provider, please arrive at least 15 minutes before your scheduled appointment time. You may need to reschedule your appointment if you arrive late (10 or more minutes). Arriving late affects you and other patients whose appointments are after yours.  Also, if you miss three or more appointments without notifying the office, you may be dismissed from the clinic at the provider's discretion.    Again, thank you for choosing [facility].  Our hope is that these requests will decrease the amount of time that you wait before being seen by our physicians.   If you have a lab appointment with the Cancer Center - please note that after April 8th, all labs will be drawn in the cancer center.  You do not have to check in or register with the main entrance as you have in the past but will complete your check-in at the cancer center.            _____________________________________________________________  Should you have questions after your visit to [facility], please contact our office at [phone] and follow the prompts.  Our office hours are 8:00 a.m. to 4:30 p.m. Monday - Thursday and 8:00 a.m. to 2:30 p.m. Friday.  Please note that voicemails left after 4:00 p.m. may not be returned until the following business day.  We are closed weekends and all major holidays.  You do have access to a nurse  24-7, just call the main number to the clinic [phone] and do not press any options, hold on the line and a nurse will answer the phone.    For prescription refill requests, have your pharmacy contact our office and allow 72 hours.    Masks are no longer required in the cancer centers. If you would like for your care team to wear a mask while they are taking care of you, please let them know. You may have one support person who is at least 53 years old accompany you for your appointments.

## 2022-06-03 NOTE — Progress Notes (Signed)
[facility] [address] [place] [address]    Clinic Day:  06/03/2022  Referring physician: Ignatius Specking, MD  Patient Care Team: Ignatius Specking, MD as PCP - General (Internal Medicine) Doreatha Massed, MD as Medical Oncologist (Oncology)   ASSESSMENT & PLAN:   Assessment: 1.  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 -Robotic assisted laparoscopic TAH and BSO and omentectomy on 12/24/2018, pathology showing high-grade serous carcinoma, PT3P NX. -Germline mutation testing was negative. -3 more cycles of adjuvant chemotherapy completed on 03/15/2019. -CTAP on 04/13/2019 showed no findings of active malignancy.  28% reduction in the volume of presumed chronic hematoma/chronic fluid collection splaying the upper margin of the spleen.  Large type III hiatal hernia. -CTAP on 10/13/2019 shows no findings of recurrence or metastatic disease. - Foundation 1 shows HRD+, LOH score>16%.  MSI-stable.  MYC amplification.  T p53 mutation. - We reviewed CT CAP from 02/21/2021 which showed progressive peritoneal metastasis compared to 12/06/2020 scan.  No bowel obstruction.  Small right pleural effusion with mild enlargement of an isolated left external iliac lymph node. - Reviewed EGD from 02/28/2021 which showed hiatal hernia and normal findings. - She reported epigastric and left upper quadrant pain worse in the last 1 week which is related to progression of her malignancy. - 6 cycles of carboplatin and paclitaxel from 03/13/2021 through 06/18/2021 - CT CAP (07/12/2021): No evidence of recurrence or metastatic disease. - Olaparib 300 mg twice daily started on 08/22/2021.    Plan: 1.  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 -  She is tolerating olaparib very well. - [place] and had [place] tibia and fibula fracture of the right leg. - Reviewed labs from 05/27/2022: Normal LFTs and creatinine.  CBC grossly normal.  CEA 125 is 6.8. - She is seeing her surgeon at [facility] for hiatal hernia on 06/14/2022. - Continue olaparib 300 mg twice daily. - RTC 2 months for follow-up with repeat CTAP with contrast and CA125 level.   2.  Lower back/left upper quadrant pain: - Continue oxycodone 20 mg every 4-6 hours as needed.   3.  Numbness in the feet/cramping in the hands: - Continue gabapentin 200 mg at bedtime.  4.  Iron deficiency state: - Last Feraheme in September 2023. - Ferritin decreased to 67 from 114.  She complains of worsening fatigue.  Recommend Feraheme x 2.  5.  Constipation: - Continue stool softener twice daily.  6.  Hypomagnesemia: - Continue magnesium twice daily.    Orders Placed This Encounter  Procedures   CT Abdomen Pelvis W Contrast    Standing Status:   Future    Standing Expiration Date:   06/03/2023    Order Specific Question:   If indicated for the ordered procedure, I authorize the administration of contrast media per Radiology protocol    Answer:   Yes    Order Specific Question:   Does the patient have a contrast media/X-ray dye allergy?    Answer:   No    Order Specific Question:   Is patient pregnant?    Answer:   No    Order Specific Question:   Preferred imaging location?    Answer:   [facility]    Order Specific Question:  Release to patient    Answer:   Immediate [1]    Order Specific Question:   If indicated for the ordered procedure, I authorize the administration of oral contrast media per Radiology protocol    Answer:   Yes   CBC with Differential/Platelet    Standing Status:   Future    Standing Expiration Date:   06/03/2023    Order Specific Question:   Release to patient    Answer:   Immediate   Comprehensive metabolic panel    Standing Status:    Future    Standing Expiration Date:   06/03/2023    Order Specific Question:   Release to patient    Answer:   Immediate   Magnesium    Standing Status:   Future    Standing Expiration Date:   06/03/2023    Order Specific Question:   Release to patient    Answer:   Immediate   Ferritin    Standing Status:   Future    Standing Expiration Date:   06/03/2023    Order Specific Question:   Release to patient    Answer:   Immediate   Iron and TIBC    Standing Status:   Future    Standing Expiration Date:   06/03/2023    Order Specific Question:   Release to patient    Answer:   Immediate   CA 125    Standing Status:   Future    Standing Expiration Date:   06/03/2023      I,[name],acting as a scribe for Doreatha Massed, MD.,have documented all relevant documentation on the behalf of Doreatha Massed, MD,as directed by  Doreatha Massed, MD while in the presence of Doreatha Massed, MD.   I, Doreatha Massed MD, have reviewed the above documentation for accuracy and completeness, and I agree with the above.   Doreatha Massed, MD   5/20/20244:43 PM  CHIEF COMPLAINT:   Diagnosis: high-grade serous ovarian cancer    Cancer Staging  No matching staging information was found for the patient.   Prior Therapy: 1. Carboplatin and paclitaxel x 7 cycles from 08/24/2018 to 03/15/2019. 2. Laparoscopic TAH & BSO & omenectomy on 12/24/2018. 3.  6 cycles of carboplatin and paclitaxel from 03/05/2021 to 06/18/2021  Current Therapy:  olaparib    HISTORY OF PRESENT ILLNESS:   Oncology History  Ovarian cancer, bilateral (HCC)  07/07/2018 Pathology Results   PLEURAL FLUID, LEFT (SPECIMEN 1 OF 1, COLLECTED 07/07/18): - MALIGNANT CELLS CONSISTENT WITH ADENOCARCINOMA - SEE COMMENT  Source Pleural Fluid, (Specimen 1 of 1, collected on 07/07/2018) Gross Specimen: Received is/are 1000cc of bloody red fluid with tissue. (TC:tc) Prepared: # Smears: 0 # Concentration  Technique Slides (i.e. ThinPrep): 1 # Cell Block: 1 Conventional Additional Studies: Two Hematology slides labeled [id number] Comment The malignant cells are positive for cytokeratin 7, p53, WT-1, Pax-8, Moc31, ER (weak) and EMA but negative for cytokeratin 20, TTF-1, GATA-3, CDX-2 and D2-40. Overall, the phenotype is consistent with a gynecologic primary; clinical correlation recommended.   07/07/2018 Procedure   Successful ultrasound guided left thoracentesis yielding 2.0 L of pleural fluid   07/08/2018 Procedure   1. Technically successful placement of left 14 French pigtail chest drain, placed to Pleur-evac water-seal.   07/08/2018 Procedure   1. Technically successful five [place] double lumen power injectable PICC placement   07/09/2018 Imaging   Ct chest 1. There is a moderate, loculated left hydropneumothorax with a small air component and moderate  fluid component. The largest loculated component is located posteriorly. There is a pigtail drainage catheter about the lateral pleural space. There is no obvious etiology, such as obvious mass or pleural disease.   2. There is a small right pleural effusion with associated atelectasis or consolidation and a subpleural consolidation of the superior segment right lower lobe (series 4, image 56), of uncertain significance, possibly infectious or inflammatory   07/10/2018 Imaging   Ct abdomen and pelvis: 1. The bilateral ovaries are enlarged by heterogeneous appearing cystic lesions, measuring 5.3 x 4.2 cm on the right (series 4, image 72) and 4.5 x 3.2 cm on the left (series 4, image 75). Consider dedicated pelvic ultrasound and/or pelvic MRI to further evaluate for solid components given high suspicion for GYN primary malignancy.   2. No other evidence of mass and no lymphadenopathy in the abdomen or pelvis.   3. Trace ascites. There is some suggestion of omental and peritoneal nodularity (e.g. Series 4, image 43), concerning for peritoneal  metastatic disease.    4. Loculated left-sided pleural effusion with left-sided pleural drainage catheter in position. Small right pleural effusion   07/13/2018 Surgery   OPERATION: 1.  Left VATS (video-assisted thoracoscopic surgery) for drainage of loculated pleural effusion. 2.  Talc pleurodesis for malignant pleural effusion. 3.  Placement of PleurX catheter for management of malignant pleural effusion. 4.  Placement of On-Q analgesia catheter system.    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   07/13/2018 Pathology Results   Pleura, peel, Left Pleural - FIBRO-FIBRINOUS PLEURITIS - NEGATIVE FOR MALIGNANCY   07/18/2018 Initial Diagnosis   Ovarian cancer, bilateral (HCC)   07/20/2018 Procedure   EGD impression: Normal proximal esophagus and mid esophagus. Mild distal esophageal rings; dilation not performed because of esophagitis. LA Grade C reflux esophagitis. Z-line regular, 30 cm from the incisors. 5 cm hiatal hernia. Non-bleeding gastric ulcer with no stigmata of bleeding. Gastritis. Duodenal erosions without bleeding. Normal second portion of the duodenum. No specimens collected.   08/19/2018 Genetic Testing   Negative genetic testing on the common hereditary cancer panel.  The Common Hereditary Gene Panel offered by [facility] includes sequencing and/or deletion duplication testing of the following 48 genes: APC, ATM, AXIN2, BARD1, BMPR1A, BRCA1, BRCA2, BRIP1, CDH1, CDK4, CDKN2A (p14ARF), CDKN2A (p16INK4a), CHEK2, CTNNA1, DICER1, EPCAM (Deletion/duplication testing only), GREM1 (promoter region deletion/duplication testing only), KIT, MEN1, MLH1, MSH2, MSH3, MSH6, MUTYH, NBN, NF1, NHTL1, PALB2, PDGFRA, PMS2, POLD1, POLE, PTEN, RAD50, RAD51C, RAD51D, RNF43, SDHB, SDHC, SDHD, SMAD4, SMARCA4.  STK11, TP53, TSC1, TSC2, and VHL.  The following genes were evaluated for sequence changes only: SDHA and HOXB13 c.251G>A variant only. The report date is August 19, 2018.   08/24/2018 - 03/15/2019 Chemotherapy   Patient is on Treatment Plan : OVARIAN Carboplatin (AUC 6) / Paclitaxel (175) q21d x 6 cycles     03/05/2021 - 06/18/2021 Chemotherapy   Patient is on Treatment Plan : OVARIAN Carboplatin (AUC 6) / Paclitaxel (175) q21d x 6 cycles        INTERVAL HISTORY:   Michelle Holt is a 52 y.o. Holt presenting to clinic today for follow up of high-grade serous ovarian cancer. She was last seen by me on 04/03/22.  Today, she states that she is doing well overall. Her appetite level is at 100%. Her energy level is at 90%.  PAST MEDICAL  HISTORY:   Past Medical History: Past Medical History:  Diagnosis Date   Anemia    Anxiety and depression    Arthritis of facet joints at multiple vertebral levels    L5-S1   Constipation    Dyslipidemia    Family history of breast cancer    Family history of uterine cancer    GERD (gastroesophageal reflux disease)    History of hiatal hernia    History of kidney stones    Insomnia    Irritable bowel syndrome    Migraine    Muscle tension headache    Neuropathy of finger    Ovarian carcinoma (HCC)    ovarian   Plantar fasciitis of right foot    Port-A-Cath in place 08/20/2018    Surgical History: Past Surgical History:  Procedure Laterality Date   ABDOMINAL HYSTERECTOMY     BIOPSY  10/27/2020   Procedure: BIOPSY;  Surgeon: Dolores Frame, MD;  [facility];  Service: Gastroenterology;;   CHOLECYSTECTOMY  2008   COLONOSCOPY N/A 08/13/2013   Procedure: COLONOSCOPY;  Surgeon: Malissa Hippo, MD;  [facility]: [facility];  Service: Endoscopy;  Laterality: N/A;  230-moved to 145 Ann to notify pt   COLONOSCOPY WITH PROPOFOL N/A 11/15/2020   Procedure: COLONOSCOPY WITH PROPOFOL;  Surgeon: Malissa Hippo, MD;  [facility];   Service: Endoscopy;  Laterality: N/A;  1:40   ESOPHAGEAL DILATION N/A 02/28/2021   Procedure: ESOPHAGEAL DILATION;  Surgeon: Malissa Hippo, MD;  [facility];  Service: Endoscopy;  Laterality: N/A;   ESOPHAGOGASTRODUODENOSCOPY     ESOPHAGOGASTRODUODENOSCOPY (EGD) WITH PROPOFOL N/A 07/20/2018   Procedure: ESOPHAGOGASTRODUODENOSCOPY (EGD) WITH PROPOFOL;  Surgeon: Malissa Hippo, MD;  [facility]: [facility];  Service: Endoscopy;  Laterality: N/A;  Possible esophageal dilation.   ESOPHAGOGASTRODUODENOSCOPY (EGD) WITH PROPOFOL N/A 10/27/2020   Procedure: ESOPHAGOGASTRODUODENOSCOPY (EGD) WITH PROPOFOL;  Surgeon: Dolores Frame, MD;  [facility];  Service: Gastroenterology;  Laterality: N/A;  2:10, pt knows to arrive at 10:15   ESOPHAGOGASTRODUODENOSCOPY (EGD) WITH PROPOFOL N/A 02/28/2021   Procedure: ESOPHAGOGASTRODUODENOSCOPY (EGD) WITH PROPOFOL;  Surgeon: Malissa Hippo, MD;  [facility];  Service: Endoscopy;  Laterality: N/A;  200 ASA 1   ESOPHAGOGASTRODUODENOSCOPY (EGD) WITH PROPOFOL N/A 03/29/2022   Procedure: ESOPHAGOGASTRODUODENOSCOPY (EGD) WITH PROPOFOL;  Surgeon: Dolores Frame, MD;  [facility];  Service: Gastroenterology;  Laterality: N/A;  2:00 pm, asa 1-2   GIVENS CAPSULE STUDY N/A 11/24/2020   Procedure: GIVENS CAPSULE STUDY;  Surgeon: Malissa Hippo, MD;  [facility];  Service: Endoscopy;  Laterality: N/A;  7:30   IR ANGIOGRAM SELECTIVE EACH ADDITIONAL VESSEL  08/01/2018   IR ANGIOGRAM VISCERAL SELECTIVE  08/01/2018   IR EMBO ART  VEN HEMORR LYMPH EXTRAV  INC GUIDE ROADMAPPING  08/01/2018   IR IMAGING GUIDED PORT INSERTION  08/20/2018   IR PERC PLEURAL DRAIN W/INDWELL CATH W/IMG GUIDE  07/08/2018   IR THORACENTESIS ASP PLEURAL SPACE W/IMG GUIDE  07/07/2018   IR US GUIDE VASC ACCESS RIGHT  08/01/2018   PLEURAL EFFUSION DRAINAGE Left 07/13/2018   Procedure: DRAINAGE OF LOCULATED PLEURAL EFFUSION;  Surgeon: Kerin Perna, MD;  Location: [facility] OR;  Service: Thoracic;  Laterality: Left;   POLYPECTOMY  11/15/2020   Procedure: POLYPECTOMY;  Surgeon: Malissa Hippo, MD;  [facility];  Service: Endoscopy;;   REMOVAL OF PLEURAL DRAINAGE CATHETER Left 08/20/2018   Procedure:  REMOVAL OF PLEURAL DRAINAGE CATHETER;  Surgeon: Kerin Perna, MD;  Location: [facility] OR;  Service: Thoracic;  Laterality: Left;   REMOVAL OF PLEURAL DRAINAGE CATHETER Left 08/20/2018   Procedure: REMOVAL OF PLEURAL DRAINAGE CATHETER;  Surgeon: Kerin Perna, MD;  Location: [facility] OR;  Service: Thoracic;  Laterality: Left;   TALC PLEURODESIS Left 07/13/2018   Procedure: Talc Pleuradesis;  Surgeon: Donata Clay, Theron Arista, MD;  Location: [facility] OR;  Service: Thoracic;  Laterality: Left;   TOTAL HIP ARTHROPLASTY Left 06/05/2020   Procedure: LEFT TOTAL HIP ARTHROPLASTY ANTERIOR APPROACH;  Surgeon: Tarry Kos, MD;  Location: [facility] OR;  Service: Orthopedics;  Laterality: Left;  3-C   TUBAL LIGATION Bilateral    UTERINE ABLATION     VIDEO ASSISTED THORACOSCOPY Left 07/13/2018   Procedure: VIDEO ASSISTED THORACOSCOPY;  Surgeon: Kerin Perna, MD;  Location: [facility] OR;  Service: Thoracic;  Laterality: Left;    Social History: Social History   Socioeconomic History   Marital status: Widowed    Spouse name: Not on file   Number of children: 2   Years of education: 2-College   Highest education level: Not on file  Occupational History    Employer: [employer]  Tobacco Use   Smoking status: Former    Packs/day: 0.50    Years: 17.00    Additional pack years: 0.00    Total pack years: 8.50    Types: Cigarettes    Quit date: 06/22/2018    Years since quitting: 3.9   Smokeless tobacco: Never  Vaping Use   Vaping Use: Never used  Substance and Sexual Activity   Alcohol use: Yes    Alcohol/week: 1.0 standard drink of alcohol    Types: 1 Glasses of wine per week    Comment: occasionally   Drug use: No   Sexual activity: Not Currently  Other Topics  Concern   Not on file  Social History Narrative   Patient lives at home with her daughter.    Patient has 2 children.    Patient is widowed.    Patient is right handed.    Patient has her Associates degree.      Social Determinants of Health   Financial Resource Strain: Low Risk  (07/22/2018)   Overall Financial Resource Strain (CARDIA)    Difficulty of Paying Living Expenses: Not hard at all  Food Insecurity: No Food Insecurity (07/22/2018)   Hunger Vital Sign    Worried About Running Out of Food in the Last Year: Never true    Ran Out of Food in the Last Year: Never true  Transportation Needs: No Transportation Needs (07/22/2018)   PRAPARE - Administrator, Civil Service (Medical): No    Lack of Transportation (Non-Medical): No  Physical Activity: Inactive (07/22/2018)   Exercise Vital Sign    Days of Exercise per Week: 0 days    Minutes of Exercise per Session: 0 min  Stress: Stress Concern Present (07/22/2018)   [employer] of Occupational Health - Occupational Stress Questionnaire    Feeling of Stress : Very much  Social Connections: Moderately Isolated (07/22/2018)   Social Connection and Isolation Panel [NHANES]    Frequency of Communication with Friends and Family: More than three times a week    Frequency of Social Gatherings with Friends and Family: More than three times a week    Attends Religious Services: 1 to 4 times per year    Active Member of Clubs or Organizations: No  Attends Banker Meetings: Never    Marital Status: Widowed  Intimate Partner Violence: Not At Risk (07/22/2018)   Humiliation, Afraid, Rape, and Kick questionnaire    Fear of Current or Ex-Partner: No    Emotionally Abused: No    Physically Abused: No    Sexually Abused: No    Family History: Family History  Problem Relation Age of Onset   Depression Mother    Hypertension Mother    Obesity Mother    Diabetes Mother    Kidney disease Mother    Peripheral vascular  disease Father    Atrial fibrillation Father    Crohn's disease Sister    Uterine cancer Sister 81       maternal half sister   COPD Brother    Osteoporosis Brother    Breast cancer Maternal Aunt 50   Colon cancer Neg Hx     Current Medications:  Current Outpatient Medications:    albuterol (VENTOLIN HFA) 108 (90 Base) MCG/ACT inhaler, Inhale 2 puffs into the lungs every 6 (six) hours as needed for wheezing or shortness of breath., Disp: , Rfl:    B Complex-C (B-COMPLEX WITH VITAMIN C) tablet, Take 1 tablet by mouth daily., Disp: , Rfl:    Bioflavonoid Products (VITAMIN C) CHEW, Chew 1 tablet by mouth daily., Disp: , Rfl:    Cholecalciferol (VITAMIN D) 50 MCG (2000 UT) CAPS, Take 2,000 Units by mouth daily., Disp: , Rfl:    Cyanocobalamin (B-12) 3000 MCG CAPS, Take 3,000 mcg by mouth every other day., Disp: , Rfl:    diazepam (VALIUM) 5 MG tablet, TAKE ONE TABLET BY MOUTH AT BEDTIME, Disp: 30 tablet, Rfl: 5   dicyclomine (BENTYL) 10 MG capsule, Take 1 capsule (10 mg total) by mouth 3 (three) times daily as needed for spasms., Disp: 90 capsule, Rfl: 3   docusate sodium (COLACE) 250 MG capsule, Take 250 mg by mouth 2 (two) times daily., Disp: , Rfl:    folic acid (FOLVITE) 1 MG tablet, Take 1 tablet (1 mg total) by mouth daily., Disp: 30 tablet, Rfl: 1   folic acid (FOLVITE) 400 MCG tablet, Take 400 mcg by mouth daily., Disp: , Rfl:    gabapentin (NEURONTIN) 100 MG capsule, TAKE TWO (2) CAPSULES BY MOUTH AT BEDTIME, Disp: 60 capsule, Rfl: 0   linaclotide (LINZESS) 290 MCG CAPS capsule, Take 1 capsule (290 mcg total) by mouth daily before breakfast., Disp: 30 capsule, Rfl: 6   loratadine (CLARITIN) 10 MG tablet, Take 10 mg by mouth daily as needed for allergies., Disp: , Rfl:    LORazepam (ATIVAN) 0.5 MG tablet, Take 0.5 mg by mouth 3 (three) times daily as needed for anxiety., Disp: , Rfl:    magic mouthwash w/lidocaine SOLN, Take 5 mLs by mouth 3 (three) times daily as needed for mouth  pain (Swish and spit)., Disp: 480 mL, Rfl: 6   magnesium oxide (MAG-OX) 400 (240 Mg) MG tablet, Take 1 tablet (400 mg total) by mouth 2 (two) times daily., Disp: 60 tablet, Rfl: 6   olaparib (LYNPARZA) 150 MG tablet, Take 2 tablets (300 mg total) by mouth 2 (two) times daily. Swallow whole. May take with food to decrease nausea and vomiting., Disp: 120 tablet, Rfl: 2   omeprazole (PRILOSEC) 40 MG capsule, TAKE ONE CAPSULE BY MOUTH TWICE DAILY BEFORE MEALS, Disp: 60 capsule, Rfl: 5   ondansetron (ZOFRAN) 4 MG tablet, TAKE ONE TABLET BY MOUTH EVERY 8 HOURS AS NEEDED FOR NAUSEA OR VOMITING,  Disp: 40 tablet, Rfl: 3   Oxycodone HCl 20 MG TABS, Take 1 tablet (20 mg total) by mouth every 4 (four) hours as needed., Disp: 180 tablet, Rfl: 0   prochlorperazine (COMPAZINE) 10 MG tablet, Take 1 tablet (10 mg total) by mouth 2 (two) times daily as needed for nausea or vomiting. Take 30 minutes before Lynparza twice daily., Disp: 60 tablet, Rfl: 3   promethazine (PHENERGAN) 25 MG tablet, Take 1 tablet (25 mg total) by mouth every 6 (six) hours as needed for nausea or vomiting. (Patient taking differently: Take 25 mg by mouth at bedtime.), Disp: 60 tablet, Rfl: 4   rivaroxaban (XARELTO) 10 MG TABS tablet, Take 1 tablet (10 mg total) by mouth daily., Disp: 14 tablet, Rfl: 0   SUMAtriptan (IMITREX) 100 MG tablet, TAKE ONE TABLET BY MOUTH PRN UP to TWICE DAILY AS NEEDED. (Patient taking differently: Take 100 mg by mouth 2 (two) times daily as needed for migraine.), Disp: 9 tablet, Rfl: 5   triamcinolone cream (KENALOG) 0.1 %, Apply 1 application topically 2 (two) times daily as needed (rash)., Disp: 80 g, Rfl: 11   venlafaxine (EFFEXOR) 75 MG tablet, TAKE ONE TABLET BY MOUTH DAILY, Disp: 30 tablet, Rfl: 3 No current facility-administered medications for this visit.  Facility-Administered Medications Ordered in Other Visits:    0.9 %  sodium chloride infusion, , Intravenous, Continuous, Doreatha Massed, MD, Stopped  at 10/23/20 1547   sodium chloride flush (NS) 0.9 % injection 10 mL, 10 mL, Intravenous, PRN, Doreatha Massed, MD, 10 mL at 10/23/20 1544   Allergies: Allergies  Allergen Reactions   Morphine And Codeine Itching   Nickel Itching   Nortriptyline Other (See Comments)    Significant weight gain   Topamax [Topiramate] Diarrhea and Nausea Only   Xanax [Alprazolam]     Can't wake up    Actifed Cold-Allergy [Chlorpheniramine-Phenyleph Er] Rash    Red dye only   Amoxicillin Rash    Did it involve swelling of the face/tongue/throat, SOB, or low BP? Unknown Did it involve sudden or severe rash/hives, skin peeling, or any reaction on the inside of your mouth or nose? Unknown Did you need to seek medical attention at a hospital or doctor's office? Unknown When did it last happen? teenager       If all above answers are "NO", may proceed with cephalosporin use.    Codeine Hives   Erythromycin Rash   Penicillins Rash    Did it involve swelling of the face/tongue/throat, SOB, or low BP? Unknown Did it involve sudden or severe rash/hives, skin peeling, or any reaction on the inside of your mouth or nose? Unknown Did you need to seek medical attention at a hospital or doctor's office? Unknown When did it last happen? teenager       If all above answers are "NO", may proceed with cephalosporin use.    Sudafed [Pseudoephedrine Hcl] Rash    Red dye only    REVIEW OF SYSTEMS:   Review of Systems  Constitutional:  Negative for chills, fatigue and fever.  HENT:   Positive for trouble swallowing. Negative for lump/mass, mouth sores, nosebleeds and sore throat.   Eyes:  Negative for eye problems.  Respiratory:  Negative for cough and shortness of breath.   Cardiovascular:  Negative for chest pain, leg swelling and palpitations.  Gastrointestinal:  Negative for abdominal pain, constipation, diarrhea, nausea and vomiting.  Genitourinary:  Negative for bladder incontinence, difficulty urinating,  dysuria, frequency, hematuria and nocturia.  Musculoskeletal:  Negative for arthralgias, back pain, flank pain, myalgias and neck pain.  Skin:  Negative for itching and rash.  Neurological:  Positive for numbness. Negative for dizziness and headaches.  Hematological:  Does not bruise/bleed easily.  Psychiatric/Behavioral:  Negative for depression, sleep disturbance and suicidal ideas. The patient is not nervous/anxious.   All other systems reviewed and are negative.    VITALS:   Blood pressure 108/66, pulse 86, temperature 98.2 F (36.8 C), resp. rate 17, SpO2 96 %.  Wt Readings from Last 3 Encounters:  03/29/22 189 lb (85.7 kg)  02/20/22 190 lb 3.2 oz (86.3 kg)  02/11/22 191 lb 6.4 oz (86.8 kg)    There is no height or weight on file to calculate BMI.  Performance status (ECOG): 1 - Symptomatic but completely ambulatory  PHYSICAL EXAM:   Physical Exam Vitals and nursing note reviewed. Exam conducted with a chaperone present.  Constitutional:      Appearance: Normal appearance.  Cardiovascular:     Rate and Rhythm: Normal rate and regular rhythm.     Pulses: Normal pulses.     Heart sounds: Normal heart sounds.  Pulmonary:     Effort: Pulmonary effort is normal.     Breath sounds: Normal breath sounds.  Abdominal:     Palpations: Abdomen is soft. There is no hepatomegaly, splenomegaly or mass.     Tenderness: There is no abdominal tenderness.  Musculoskeletal:     Right lower leg: No edema.     Left lower leg: No edema.  Lymphadenopathy:     Cervical: No cervical adenopathy.     Right cervical: No superficial, deep or posterior cervical adenopathy.    Left cervical: No superficial, deep or posterior cervical adenopathy.     Upper Body:     Right upper body: No supraclavicular or axillary adenopathy.     Left upper body: No supraclavicular or axillary adenopathy.  Neurological:     General: No focal deficit present.     Mental Status: She is alert and oriented to  person, place, and time.  Psychiatric:        Mood and Affect: Mood normal.        Behavior: Behavior normal.     LABS:      Latest Ref Rng & Units 05/27/2022    2:42 PM 03/27/2022   10:04 AM 02/14/2022   12:29 PM  CBC  WBC 4.0 - 10.5 K/uL 6.4  9.6  6.6   Hemoglobin 12.0 - 15.0 g/dL 19.1  47.8  29.5   Hematocrit 36.0 - 46.0 % 37.5  36.2  38.1   Platelets 150 - 400 K/uL 290  334  283       Latest Ref Rng & Units 05/27/2022    2:42 PM 03/27/2022   10:04 AM 02/14/2022   12:29 PM  CMP  Glucose 70 - 99 mg/dL 621  308  657   BUN 6 - 20 mg/dL 9  13  15    Creatinine 0.44 - 1.00 mg/dL 8.46  9.62  9.52   Sodium 135 - 145 mmol/L 137  134  134   Potassium 3.5 - 5.1 mmol/L 3.5  3.9  3.5   Chloride 98 - 111 mmol/L 101  99  99   CO2 22 - 32 mmol/L 26  25  26    Calcium 8.9 - 10.3 mg/dL 9.1  8.8  9.1   Total Protein 6.5 - 8.1 g/dL 7.2  6.8  6.7  Total Bilirubin 0.3 - 1.2 mg/dL 0.3  0.5  0.3   Alkaline Phos 38 - 126 U/L 84  76  87   AST 15 - 41 U/L 22  23  28    ALT 0 - 44 U/L 21  19  17       No results found for: "CEA1", "CEA" / No results found for: "CEA1", "CEA" No results found for: "PSA1" No results found for: "UJW119" Lab Results  Component Value Date   CAN125 6.8 05/27/2022    No results found for: "TOTALPROTELP", "ALBUMINELP", "A1GS", "A2GS", "BETS", "BETA2SER", "GAMS", "MSPIKE", "SPEI" Lab Results  Component Value Date   TIBC 346 05/27/2022   TIBC 351 03/27/2022   TIBC 355 02/14/2022   FERRITIN 67 05/27/2022   FERRITIN 114 03/27/2022   FERRITIN 135 02/14/2022   IRONPCTSAT 14 05/27/2022   IRONPCTSAT 24 03/27/2022   IRONPCTSAT 34 (H) 02/14/2022   Lab Results  Component Value Date   LDH 277 (H) 07/18/2018   LDH 193 (H) 07/07/2018     STUDIES:   XR Ankle Complete Right  Result Date: 05/21/2022 X-rays demonstrate consolidation of the fracture site  XR Ankle Complete Right  Result Date: 05/07/2022 X-rays demonstrate stable appearance of minimally displaced lateral  malleolus fracture.

## 2022-06-04 ENCOUNTER — Other Ambulatory Visit (HOSPITAL_COMMUNITY): Payer: Self-pay

## 2022-06-04 ENCOUNTER — Other Ambulatory Visit (INDEPENDENT_AMBULATORY_CARE_PROVIDER_SITE_OTHER): Payer: 59

## 2022-06-04 ENCOUNTER — Ambulatory Visit (INDEPENDENT_AMBULATORY_CARE_PROVIDER_SITE_OTHER): Payer: 59 | Admitting: Orthopaedic Surgery

## 2022-06-04 DIAGNOSIS — M79671 Pain in right foot: Secondary | ICD-10-CM | POA: Diagnosis not present

## 2022-06-04 DIAGNOSIS — M25571 Pain in right ankle and joints of right foot: Secondary | ICD-10-CM | POA: Diagnosis not present

## 2022-06-04 DIAGNOSIS — S8264XD Nondisplaced fracture of lateral malleolus of right fibula, subsequent encounter for closed fracture with routine healing: Secondary | ICD-10-CM | POA: Diagnosis not present

## 2022-06-04 NOTE — Progress Notes (Signed)
Office Visit Note   Patient: Michelle Holt           Date of Birth: 1970/07/12           MRN: [id number] Visit Date: 06/04/2022              Requested by: Ignatius Specking, MD [address] [place],  [place] [address] PCP: Ignatius Specking, MD   Assessment & Plan: Visit Diagnoses:  1. Pain in right ankle and joints of right foot     Plan: Impression is 8 weeks status post right nondisplaced lateral malleolus fracture.  We will transition her to an ASO brace.  She will continue to do PT for strengthening.  Recheck in 4 weeks with three-view x-rays of the right ankle.  Follow-Up Instructions: Return in about 4 weeks (around 07/02/2022).   Orders:  Orders Placed This Encounter  Procedures   XR Ankle Complete Right   No orders of the defined types were placed in this encounter.     Procedures: No procedures performed   Clinical Data: No additional findings.   Subjective: Chief Complaint  Patient presents with   Right Ankle - Follow-up    HPI Michelle Holt returns today for follow-up and distal fibula fracture.  She is doing well overall in a cam boot.  She has been doing physical therapy. Review of Systems   Objective: Vital Signs: LMP  (LMP Unknown)   Physical Exam  Ortho Exam Examination of the right ankle shows mild swelling.  No bony tenderness. Specialty Comments:  No specialty comments available.  Imaging: No results found.   PMFS History: Patient Active Problem List   Diagnosis Date Noted   Nausea with vomiting 02/11/2022   Mixed stress and urge urinary incontinence 08/02/2021   Microscopic hematuria 08/02/2021   Palliative care by specialist    Goals of care, counseling/discussion    General weakness    Leukopenia 06/23/2021   Thrombocytopenia (HCC) 06/23/2021   Elevated d-dimer 06/23/2021   Low back pain 06/23/2021   Hiatal hernia 06/23/2021   Obesity (BMI 30-39.9) 06/23/2021   Acute bronchospasm 06/23/2021   CAP (community acquired pneumonia) 06/22/2021    GERD (gastroesophageal reflux disease)    Iron deficiency anemia 11/07/2020   Post-op pain 07/12/2020   Splenic lesion 07/12/2020   Status post total replacement of left hip 06/05/2020   Primary osteoarthritis of left hip 02/09/2020   Pain in right knee 02/09/2020   Palliative care patient 09/18/2018   Port-A-Cath in place 08/20/2018   Genetic testing 08/20/2018   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   Splenic laceration 07/31/2018   Family history of uterine cancer    Family history of breast cancer    Macrocytic anemia 07/18/2018   Ovarian cancer, bilateral (HCC) 07/18/2018   HCAP (healthcare-associated pneumonia) 07/18/2018   Pleural effusion 07/07/2018   Migraine 07/06/2018   Pleural effusion on left 07/06/2018   Acute respiratory failure with hypoxia (HCC) 07/06/2018   Tobacco abuse 07/06/2018   Generalized anxiety disorder 05/02/2014   Unspecified constipation 08/10/2013   Lumbosacral spondylosis without myelopathy 10/23/2011   Intractable migraine without aura 10/23/2011   Past Medical History:  Diagnosis Date   Anemia    Anxiety and depression    Arthritis of facet joints at multiple vertebral levels    L5-S1   Constipation    Dyslipidemia    Family history of breast cancer    Family history of uterine cancer    GERD (  gastroesophageal reflux disease)    History of hiatal hernia    History of kidney stones    Insomnia    Irritable bowel syndrome    Migraine    Muscle tension headache    Neuropathy of finger    Ovarian carcinoma (HCC)    ovarian   Plantar fasciitis of right foot    Port-A-Cath in place 08/20/2018    Family History  Problem Relation Age of Onset   Depression Mother    Hypertension Mother    Obesity Mother    Diabetes Mother    Kidney disease Mother    Peripheral vascular disease Father    Atrial fibrillation Father    Crohn's disease Sister    Uterine cancer Sister 43       maternal half sister   COPD  Brother    Osteoporosis Brother    Breast cancer Maternal Aunt 72   Colon cancer Neg Hx     Past Surgical History:  Procedure Laterality Date   ABDOMINAL HYSTERECTOMY     BIOPSY  10/27/2020   Procedure: BIOPSY;  Surgeon: Dolores Frame, MD;  [facility];  Service: Gastroenterology;;   CHOLECYSTECTOMY  2008   COLONOSCOPY N/A 08/13/2013   Procedure: COLONOSCOPY;  Surgeon: Malissa Hippo, MD;  [facility]: [facility];  Service: Endoscopy;  Laterality: N/A;  230-moved to 145 Ann to notify pt   COLONOSCOPY WITH PROPOFOL N/A 11/15/2020   Procedure: COLONOSCOPY WITH PROPOFOL;  Surgeon: Malissa Hippo, MD;  [facility];  Service: Endoscopy;  Laterality: N/A;  1:40   ESOPHAGEAL DILATION N/A 02/28/2021   Procedure: ESOPHAGEAL DILATION;  Surgeon: Malissa Hippo, MD;  [facility];  Service: Endoscopy;  Laterality: N/A;   ESOPHAGOGASTRODUODENOSCOPY     ESOPHAGOGASTRODUODENOSCOPY (EGD) WITH PROPOFOL N/A 07/20/2018   Procedure: ESOPHAGOGASTRODUODENOSCOPY (EGD) WITH PROPOFOL;  Surgeon: Malissa Hippo, MD;  [facility];  Service: Endoscopy;  Laterality: N/A;  Possible esophageal dilation.   ESOPHAGOGASTRODUODENOSCOPY (EGD) WITH PROPOFOL N/A 10/27/2020   Procedure: ESOPHAGOGASTRODUODENOSCOPY (EGD) WITH PROPOFOL;  Surgeon: Dolores Frame, MD;  [facility];  Service: Gastroenterology;  Laterality: N/A;  2:10, pt knows to arrive at 10:15   ESOPHAGOGASTRODUODENOSCOPY (EGD) WITH PROPOFOL N/A 02/28/2021   Procedure: ESOPHAGOGASTRODUODENOSCOPY (EGD) WITH PROPOFOL;  Surgeon: Malissa Hippo, MD;  [facility];  Service: Endoscopy;  Laterality: N/A;  200 ASA 1   ESOPHAGOGASTRODUODENOSCOPY (EGD) WITH PROPOFOL N/A 03/29/2022   Procedure: ESOPHAGOGASTRODUODENOSCOPY (EGD) WITH PROPOFOL;  Surgeon: Dolores Frame, MD;  [facility];  Service: Gastroenterology;  Laterality: N/A;  2:00 pm, asa 1-2   GIVENS  CAPSULE STUDY N/A 11/24/2020   Procedure: GIVENS CAPSULE STUDY;  Surgeon: Malissa Hippo, MD;  [facility];  Service: Endoscopy;  Laterality: N/A;  7:30   IR ANGIOGRAM SELECTIVE EACH ADDITIONAL VESSEL  08/01/2018   IR ANGIOGRAM VISCERAL SELECTIVE  08/01/2018   IR EMBO ART  VEN HEMORR LYMPH EXTRAV  INC GUIDE ROADMAPPING  08/01/2018   IR IMAGING GUIDED PORT INSERTION  08/20/2018   IR PERC PLEURAL DRAIN W/INDWELL CATH W/IMG GUIDE  07/08/2018   IR THORACENTESIS ASP PLEURAL SPACE W/IMG GUIDE  07/07/2018   IR US GUIDE VASC ACCESS RIGHT  08/01/2018   PLEURAL EFFUSION DRAINAGE Left 07/13/2018   Procedure: DRAINAGE OF LOCULATED PLEURAL EFFUSION;  Surgeon: Kerin Perna, MD;  Location: [facility] OR;  Service: Thoracic;  Laterality: Left;   POLYPECTOMY  11/15/2020  Procedure: POLYPECTOMY;  Surgeon: Malissa Hippo, MD;  [facility];  Service: Endoscopy;;   REMOVAL OF PLEURAL DRAINAGE CATHETER Left 08/20/2018   Procedure: REMOVAL OF PLEURAL DRAINAGE CATHETER;  Surgeon: Kerin Perna, MD;  Location: [facility] OR;  Service: Thoracic;  Laterality: Left;   REMOVAL OF PLEURAL DRAINAGE CATHETER Left 08/20/2018   Procedure: REMOVAL OF PLEURAL DRAINAGE CATHETER;  Surgeon: Kerin Perna, MD;  Location: [facility] OR;  Service: Thoracic;  Laterality: Left;   TALC PLEURODESIS Left 07/13/2018   Procedure: Talc Pleuradesis;  Surgeon: Donata Clay, Theron Arista, MD;  Location: [facility] OR;  Service: Thoracic;  Laterality: Left;   TOTAL HIP ARTHROPLASTY Left 06/05/2020   Procedure: LEFT TOTAL HIP ARTHROPLASTY ANTERIOR APPROACH;  Surgeon: Tarry Kos, MD;  Location: [facility] OR;  Service: Orthopedics;  Laterality: Left;  3-C   TUBAL LIGATION Bilateral    UTERINE ABLATION     VIDEO ASSISTED THORACOSCOPY Left 07/13/2018   Procedure: VIDEO ASSISTED THORACOSCOPY;  Surgeon: Donata Clay, Theron Arista, MD;  Location: [facility] OR;  Service: Thoracic;  Laterality: Left;   Social History   Occupational History    Employer: [employer]  Tobacco Use   Smoking  status: Former    Packs/day: 0.50    Years: 17.00    Additional pack years: 0.00    Total pack years: 8.50    Types: Cigarettes    Quit date: 06/22/2018    Years since quitting: 3.9   Smokeless tobacco: Never  Vaping Use   Vaping Use: Never used  Substance and Sexual Activity   Alcohol use: Yes    Alcohol/week: 1.0 standard drink of alcohol    Types: 1 Glasses of wine per week    Comment: occasionally   Drug use: No   Sexual activity: Not Currently

## 2022-06-05 ENCOUNTER — Inpatient Hospital Stay: Payer: 59

## 2022-06-05 VITALS — BP 110/51 | HR 75 | Temp 97.3°F | Resp 18

## 2022-06-05 DIAGNOSIS — Z79899 Other long term (current) drug therapy: Secondary | ICD-10-CM | POA: Diagnosis not present

## 2022-06-05 DIAGNOSIS — C563 Malignant neoplasm of bilateral ovaries: Secondary | ICD-10-CM | POA: Diagnosis not present

## 2022-06-05 DIAGNOSIS — D649 Anemia, unspecified: Secondary | ICD-10-CM

## 2022-06-05 DIAGNOSIS — D509 Iron deficiency anemia, unspecified: Secondary | ICD-10-CM | POA: Diagnosis not present

## 2022-06-05 DIAGNOSIS — Z87891 Personal history of nicotine dependence: Secondary | ICD-10-CM | POA: Diagnosis not present

## 2022-06-05 DIAGNOSIS — Z7901 Long term (current) use of anticoagulants: Secondary | ICD-10-CM | POA: Diagnosis not present

## 2022-06-05 DIAGNOSIS — C786 Secondary malignant neoplasm of retroperitoneum and peritoneum: Secondary | ICD-10-CM | POA: Diagnosis not present

## 2022-06-05 DIAGNOSIS — Z9221 Personal history of antineoplastic chemotherapy: Secondary | ICD-10-CM | POA: Diagnosis not present

## 2022-06-05 MED ORDER — SODIUM CHLORIDE 0.9 % IV SOLN: INTRAVENOUS | Status: DC

## 2022-06-05 MED ORDER — CETIRIZINE HCL 10 MG PO TABS
10.0000 mg | ORAL_TABLET | Freq: Once | ORAL | Status: AC
  Administered 2022-06-05: 10 mg via ORAL
  Filled 2022-06-05: qty 1

## 2022-06-05 MED ORDER — SODIUM CHLORIDE 0.9% FLUSH
10.0000 mL | INTRAVENOUS | Status: DC | PRN
  Administered 2022-06-05: 10 mL

## 2022-06-05 MED ORDER — SODIUM CHLORIDE 0.9 % IV SOLN
510.0000 mg | Freq: Once | INTRAVENOUS | Status: AC
  Administered 2022-06-05: 510 mg via INTRAVENOUS
  Filled 2022-06-05: qty 510

## 2022-06-05 MED ORDER — LORATADINE 10 MG PO TABS: 10.0000 mg | ORAL_TABLET | Freq: Once | ORAL | Status: DC

## 2022-06-05 MED ORDER — HEPARIN SOD (PORK) LOCK FLUSH 100 UNIT/ML IV SOLN
500.0000 [IU] | Freq: Once | INTRAVENOUS | Status: AC
  Administered 2022-06-05: 500 [IU] via INTRAVENOUS

## 2022-06-05 MED ORDER — FAMOTIDINE 20 MG PO TABS
20.0000 mg | ORAL_TABLET | Freq: Once | ORAL | Status: AC
  Administered 2022-06-05: 20 mg via ORAL
  Filled 2022-06-05: qty 1

## 2022-06-05 NOTE — Progress Notes (Signed)
Patient presents today for iron infusion.  Patient is in satisfactory condition with no new complaints voiced.  Vital signs are stable.  We will proceed with infusion per provider orders.    Peripheral IV started with good blood return pre and post infusion.  Feraheme 510 mg  given today per MD orders. Tolerated infusion without adverse affects. Vital signs stable. No complaints at this time. Discharged from clinic ambulatory in stable condition. Alert and oriented x 3. F/U with [facility] as scheduled.

## 2022-06-12 ENCOUNTER — Inpatient Hospital Stay: Payer: 59

## 2022-06-14 DIAGNOSIS — K219 Gastro-esophageal reflux disease without esophagitis: Secondary | ICD-10-CM | POA: Diagnosis not present

## 2022-06-14 DIAGNOSIS — Z713 Dietary counseling and surveillance: Secondary | ICD-10-CM | POA: Diagnosis not present

## 2022-06-14 DIAGNOSIS — K2100 Gastro-esophageal reflux disease with esophagitis, without bleeding: Secondary | ICD-10-CM | POA: Diagnosis not present

## 2022-06-14 DIAGNOSIS — K449 Diaphragmatic hernia without obstruction or gangrene: Secondary | ICD-10-CM | POA: Diagnosis not present

## 2022-06-14 DIAGNOSIS — Z87891 Personal history of nicotine dependence: Secondary | ICD-10-CM | POA: Diagnosis not present

## 2022-06-17 DIAGNOSIS — M25571 Pain in right ankle and joints of right foot: Secondary | ICD-10-CM | POA: Diagnosis not present

## 2022-06-17 DIAGNOSIS — M79671 Pain in right foot: Secondary | ICD-10-CM | POA: Diagnosis not present

## 2022-06-19 ENCOUNTER — Other Ambulatory Visit: Payer: Self-pay

## 2022-06-19 DIAGNOSIS — M79671 Pain in right foot: Secondary | ICD-10-CM | POA: Diagnosis not present

## 2022-06-19 DIAGNOSIS — M25571 Pain in right ankle and joints of right foot: Secondary | ICD-10-CM | POA: Diagnosis not present

## 2022-06-19 DIAGNOSIS — R232 Flushing: Secondary | ICD-10-CM

## 2022-06-19 MED ORDER — VENLAFAXINE HCL 75 MG PO TABS
75.0000 mg | ORAL_TABLET | Freq: Every day | ORAL | 3 refills | Status: DC
Start: 2022-06-19 — End: 2022-11-28

## 2022-06-24 ENCOUNTER — Other Ambulatory Visit: Payer: Self-pay | Admitting: *Deleted

## 2022-06-24 ENCOUNTER — Inpatient Hospital Stay: Payer: 59 | Attending: Hematology

## 2022-06-24 VITALS — BP 133/58 | HR 77 | Temp 97.2°F | Resp 20

## 2022-06-24 DIAGNOSIS — Z79899 Other long term (current) drug therapy: Secondary | ICD-10-CM | POA: Diagnosis not present

## 2022-06-24 DIAGNOSIS — C786 Secondary malignant neoplasm of retroperitoneum and peritoneum: Secondary | ICD-10-CM | POA: Insufficient documentation

## 2022-06-24 DIAGNOSIS — G629 Polyneuropathy, unspecified: Secondary | ICD-10-CM | POA: Diagnosis not present

## 2022-06-24 DIAGNOSIS — K5900 Constipation, unspecified: Secondary | ICD-10-CM | POA: Insufficient documentation

## 2022-06-24 DIAGNOSIS — C563 Malignant neoplasm of bilateral ovaries: Secondary | ICD-10-CM | POA: Diagnosis not present

## 2022-06-24 DIAGNOSIS — D509 Iron deficiency anemia, unspecified: Secondary | ICD-10-CM | POA: Diagnosis not present

## 2022-06-24 DIAGNOSIS — D649 Anemia, unspecified: Secondary | ICD-10-CM

## 2022-06-24 MED ORDER — FAMOTIDINE 20 MG PO TABS
20.0000 mg | ORAL_TABLET | Freq: Once | ORAL | Status: AC
  Administered 2022-06-24: 20 mg via ORAL
  Filled 2022-06-24: qty 1

## 2022-06-24 MED ORDER — CETIRIZINE HCL 10 MG PO TABS
10.0000 mg | ORAL_TABLET | Freq: Once | ORAL | Status: AC
  Administered 2022-06-24: 10 mg via ORAL
  Filled 2022-06-24: qty 1

## 2022-06-24 MED ORDER — HEPARIN SOD (PORK) LOCK FLUSH 100 UNIT/ML IV SOLN
500.0000 [IU] | Freq: Once | INTRAVENOUS | Status: AC
  Administered 2022-06-24: 500 [IU] via INTRAVENOUS

## 2022-06-24 MED ORDER — OXYCODONE HCL 20 MG PO TABS: 1.0000 | ORAL_TABLET | ORAL | 0 refills | Status: DC | PRN

## 2022-06-24 MED ORDER — SODIUM CHLORIDE 0.9 % IV SOLN: INTRAVENOUS | Status: DC

## 2022-06-24 MED ORDER — SODIUM CHLORIDE 0.9 % IV SOLN
510.0000 mg | Freq: Once | INTRAVENOUS | Status: AC
  Administered 2022-06-24: 510 mg via INTRAVENOUS
  Filled 2022-06-24: qty 510

## 2022-06-24 MED ORDER — SODIUM CHLORIDE 0.9% FLUSH
10.0000 mL | INTRAVENOUS | Status: DC | PRN
  Administered 2022-06-24: 10 mL via INTRAVENOUS

## 2022-06-24 MED ORDER — LORATADINE 10 MG PO TABS: 10.0000 mg | ORAL_TABLET | Freq: Once | ORAL | Status: DC

## 2022-06-24 NOTE — Progress Notes (Signed)
Patient presents today for iron infusion. Patient is in satisfactory condition with no new complaints voiced.  Vital signs are stable.  We will proceed with infusion per provider orders.    Feraheme 510 mg given today per MD orders. Tolerated infusion without adverse affects. Vital signs stable. No complaints at this time. Discharged from clinic ambulatory in stable condition. Alert and oriented x 3. F/U with [facility] as scheduled.

## 2022-06-24 NOTE — Patient Instructions (Signed)
[facility]  Discharge Instructions: Thank you for choosing [facility] to provide your oncology and hematology care.  If you have a lab appointment with the Cancer Center - please note that after April 8th, 2024, all labs will be drawn in the cancer center.  You do not have to check in or register with the main entrance as you have in the past but will complete your check-in in the cancer center.  Wear comfortable clothing and clothing appropriate for easy access to any Portacath or PICC line.   We strive to give you quality time with your provider. You may need to reschedule your appointment if you arrive late (15 or more minutes).  Arriving late affects you and other patients whose appointments are after yours.  Also, if you miss three or more appointments without notifying the office, you may be dismissed from the clinic at the provider's discretion.      For prescription refill requests, have your pharmacy contact our office and allow 72 hours for refills to be completed.    Today you received Feraheme IV iron infusion.   .  BELOW ARE SYMPTOMS THAT SHOULD BE REPORTED IMMEDIATELY: *FEVER GREATER THAN 100.4 F (38 C) OR HIGHER *CHILLS OR SWEATING *NAUSEA AND VOMITING THAT IS NOT CONTROLLED WITH YOUR NAUSEA MEDICATION *UNUSUAL SHORTNESS OF BREATH *UNUSUAL BRUISING OR BLEEDING *URINARY PROBLEMS (pain or burning when urinating, or frequent urination) *BOWEL PROBLEMS (unusual diarrhea, constipation, pain near the anus) TENDERNESS IN MOUTH AND THROAT WITH OR WITHOUT PRESENCE OF ULCERS (sore throat, sores in mouth, or a toothache) UNUSUAL RASH, SWELLING OR PAIN  UNUSUAL VAGINAL DISCHARGE OR ITCHING   Items with * indicate a potential emergency and should be followed up as soon as possible or go to the Emergency Department if any problems should occur.  Please show the CHEMOTHERAPY ALERT CARD or IMMUNOTHERAPY ALERT CARD at check-in to the Emergency  Department and triage nurse.  Should you have questions after your visit or need to cancel or reschedule your appointment, please contact [facility] [phone]  and follow the prompts.  Office hours are 8:00 a.m. to 4:30 p.m. Monday - Friday. Please note that voicemails left after 4:00 p.m. may not be returned until the following business day.  We are closed weekends and major holidays. You have access to a nurse at all times for urgent questions. Please call the main number to the clinic [phone] and follow the prompts.  For any non-urgent questions, you may also contact your provider using MyChart. We now offer e-Visits for anyone 18 and older to request care online for non-urgent symptoms. For details visit [email or web address].   Also download the MyChart app! Go to the app store, search "MyChart", open the app, select [place], and log in with your MyChart username and password.   

## 2022-06-24 NOTE — Progress Notes (Signed)
Pharmacy has substituted cetirizine 10 mg orally x 1 as premedication for   Loratidine discontinued.  V.O. Dr Katragadda/Randell Teare, PharmD  

## 2022-06-25 ENCOUNTER — Other Ambulatory Visit: Payer: Self-pay | Admitting: *Deleted

## 2022-06-25 NOTE — Telephone Encounter (Signed)
From: [place] To: Office of Doreatha Massed, MD Sent: 06/24/2022 4:23 PM EDT Subject: Medication Renewal Request  Refills have been requested for the following medications:   Oxycodone HCl 20 MG TABS [Michelle Holt]  Preferred pharmacy: [facility] [phone] - [place], [place] - [address] AT [facility] [address] Delivery method: Pickup

## 2022-06-26 ENCOUNTER — Other Ambulatory Visit (HOSPITAL_COMMUNITY): Payer: Self-pay

## 2022-06-28 ENCOUNTER — Other Ambulatory Visit (HOSPITAL_COMMUNITY): Payer: Self-pay

## 2022-07-01 ENCOUNTER — Other Ambulatory Visit: Payer: Self-pay | Admitting: Hematology

## 2022-07-01 ENCOUNTER — Other Ambulatory Visit (HOSPITAL_COMMUNITY): Payer: Self-pay

## 2022-07-01 DIAGNOSIS — M79671 Pain in right foot: Secondary | ICD-10-CM | POA: Diagnosis not present

## 2022-07-01 DIAGNOSIS — M25571 Pain in right ankle and joints of right foot: Secondary | ICD-10-CM | POA: Diagnosis not present

## 2022-07-02 ENCOUNTER — Other Ambulatory Visit (INDEPENDENT_AMBULATORY_CARE_PROVIDER_SITE_OTHER): Payer: 59

## 2022-07-02 ENCOUNTER — Ambulatory Visit (INDEPENDENT_AMBULATORY_CARE_PROVIDER_SITE_OTHER): Payer: 59 | Admitting: Orthopaedic Surgery

## 2022-07-02 DIAGNOSIS — M25571 Pain in right ankle and joints of right foot: Secondary | ICD-10-CM

## 2022-07-02 NOTE — Progress Notes (Signed)
Office Visit Note   Patient: Michelle Holt           Date of Birth: [birth year]           MRN: [id number] Visit Date: 07/02/2022              Requested by: Michelle Specking, MD [address] [place],  [place] [address] PCP: Michelle Specking, MD   Assessment & Plan: Visit Diagnoses:  1. Pain in right ankle and joints of right foot     Plan: [name] is now 11 weeks status post right fibula fracture.  Date of injury was 04/19/2022.  She has improved quite a bit and continues to do physical therapy twice a week.  She wears an ASO during ambulation.  Everything is healing well from my perspective.  She can follow-up with me in a month with repeat three-view x-rays of the right ankle when she also follows up for her hip appointment.  Follow-Up Instructions: Return in about 1 month (around 08/01/2022) for follow up for ankle with hip appt.   Orders:  Orders Placed This Encounter  Procedures   XR Ankle Complete Right   No orders of the defined types were placed in this encounter.     Procedures: No procedures performed   Clinical Data: No additional findings.   Subjective: Chief Complaint  Patient presents with   Right Ankle - Fracture, Follow-up    HPI Patient returns today for follow-up for right ankle fracture. Review of Systems   Objective: Vital Signs: LMP  (LMP Unknown)   Physical Exam  Ortho Exam Examination of the right ankle shows minimal tenderness and swelling.  She has excellent range of motion without pain. Specialty Comments:  No specialty comments available.  Imaging: XR Ankle Complete Right  Result Date: 07/02/2022 X-rays show abundant callus formation and fracture consolidation.    PMFS History: Patient Active Problem List   Diagnosis Date Noted   Nausea with vomiting 02/11/2022   Mixed stress and urge urinary incontinence 08/02/2021   Microscopic hematuria 08/02/2021   Palliative care by specialist    Goals of care, counseling/discussion    General  weakness    Leukopenia 06/23/2021   Thrombocytopenia (HCC) 06/23/2021   Elevated d-dimer 06/23/2021   Low back pain 06/23/2021   Hiatal hernia 06/23/2021   Obesity (BMI 30-39.9) 06/23/2021   Acute bronchospasm 06/23/2021   CAP (community acquired pneumonia) 06/22/2021   GERD (gastroesophageal reflux disease)    Iron deficiency anemia 11/07/2020   Post-op pain 07/12/2020   Splenic lesion 07/12/2020   Status post total replacement of left hip 06/05/2020   Primary osteoarthritis of left hip 02/09/2020   Pain in right knee 02/09/2020   Palliative care patient 09/18/2018   Port-A-Cath in place 08/20/2018   Genetic testing 08/20/2018   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   Splenic laceration 07/31/2018   Family history of uterine cancer    Family history of breast cancer    Macrocytic anemia 07/18/2018   Ovarian cancer, bilateral (HCC) 07/18/2018   HCAP (healthcare-associated pneumonia) 07/18/2018   Pleural effusion 07/07/2018   Migraine 07/06/2018   Pleural effusion on left 07/06/2018   Acute respiratory failure with hypoxia (HCC) 07/06/2018   Tobacco abuse 07/06/2018   Generalized anxiety disorder 05/02/2014   Unspecified constipation 08/10/2013   Lumbosacral spondylosis without myelopathy 10/23/2011   Intractable migraine without aura 10/23/2011   Past Medical History:  Diagnosis Date   Anemia  Anxiety and depression    Arthritis of facet joints at multiple vertebral levels    L5-S1   Constipation    Dyslipidemia    Family history of breast cancer    Family history of uterine cancer    GERD (gastroesophageal reflux disease)    History of hiatal hernia    History of kidney stones    Insomnia    Irritable bowel syndrome    Migraine    Muscle tension headache    Neuropathy of finger    Ovarian carcinoma (HCC)    ovarian   Plantar fasciitis of right foot    Port-A-Cath in place 08/20/2018    Family History  Problem Relation Age of  Onset   Depression Mother    Hypertension Mother    Obesity Mother    Diabetes Mother    Kidney disease Mother    Peripheral vascular disease Father    Atrial fibrillation Father    Crohn's disease Sister    Uterine cancer Sister 73       maternal half sister   COPD Brother    Osteoporosis Brother    Breast cancer Maternal Aunt 61   Colon cancer Neg Hx     Past Surgical History:  Procedure Laterality Date   ABDOMINAL HYSTERECTOMY     BIOPSY  10/27/2020   Procedure: BIOPSY;  Surgeon: Dolores Frame, MD;  [facility];  Service: Gastroenterology;;   CHOLECYSTECTOMY  2008   COLONOSCOPY N/A 08/13/2013   Procedure: COLONOSCOPY;  Surgeon: Malissa Hippo, MD;  [facility];  Service: Endoscopy;  Laterality: N/A;  230-moved to 145 Ann to notify pt   COLONOSCOPY WITH PROPOFOL N/A 11/15/2020   Procedure: COLONOSCOPY WITH PROPOFOL;  Surgeon: Malissa Hippo, MD;  [facility];  Service: Endoscopy;  Laterality: N/A;  1:40   ESOPHAGEAL DILATION N/A 02/28/2021   Procedure: ESOPHAGEAL DILATION;  Surgeon: Malissa Hippo, MD;  [facility];  Service: Endoscopy;  Laterality: N/A;   ESOPHAGOGASTRODUODENOSCOPY     ESOPHAGOGASTRODUODENOSCOPY (EGD) WITH PROPOFOL N/A 07/20/2018   Procedure: ESOPHAGOGASTRODUODENOSCOPY (EGD) WITH PROPOFOL;  Surgeon: Malissa Hippo, MD;  [facility];  Service: Endoscopy;  Laterality: N/A;  Possible esophageal dilation.   ESOPHAGOGASTRODUODENOSCOPY (EGD) WITH PROPOFOL N/A 10/27/2020   Procedure: ESOPHAGOGASTRODUODENOSCOPY (EGD) WITH PROPOFOL;  Surgeon: Dolores Frame, MD;  [facility];  Service: Gastroenterology;  Laterality: N/A;  2:10, pt knows to arrive at 10:15   ESOPHAGOGASTRODUODENOSCOPY (EGD) WITH PROPOFOL N/A 02/28/2021   Procedure: ESOPHAGOGASTRODUODENOSCOPY (EGD) WITH PROPOFOL;  Surgeon: Malissa Hippo, MD;  [facility];  Service: Endoscopy;  Laterality: N/A;  200  ASA 1   ESOPHAGOGASTRODUODENOSCOPY (EGD) WITH PROPOFOL N/A 03/29/2022   Procedure: ESOPHAGOGASTRODUODENOSCOPY (EGD) WITH PROPOFOL;  Surgeon: Dolores Frame, MD;  [facility];  Service: Gastroenterology;  Laterality: N/A;  2:00 pm, asa 1-2   GIVENS CAPSULE STUDY N/A 11/24/2020   Procedure: GIVENS CAPSULE STUDY;  Surgeon: Malissa Hippo, MD;  [facility];  Service: Endoscopy;  Laterality: N/A;  7:30   IR ANGIOGRAM SELECTIVE EACH ADDITIONAL VESSEL  08/01/2018   IR ANGIOGRAM VISCERAL SELECTIVE  08/01/2018   IR EMBO ART  VEN HEMORR LYMPH EXTRAV  INC GUIDE ROADMAPPING  08/01/2018   IR IMAGING GUIDED PORT INSERTION  08/20/2018   IR PERC PLEURAL DRAIN W/INDWELL CATH W/IMG GUIDE  07/08/2018   IR THORACENTESIS ASP PLEURAL SPACE W/IMG GUIDE  07/07/2018  IR US GUIDE VASC ACCESS RIGHT  08/01/2018   PLEURAL EFFUSION DRAINAGE Left 07/13/2018   Procedure: DRAINAGE OF LOCULATED PLEURAL EFFUSION;  Surgeon: Kerin Perna, MD;  Location: [facility] OR;  Service: Thoracic;  Laterality: Left;   POLYPECTOMY  11/15/2020   Procedure: POLYPECTOMY;  Surgeon: Malissa Hippo, MD;  [facility];  Service: Endoscopy;;   REMOVAL OF PLEURAL DRAINAGE CATHETER Left 08/20/2018   Procedure: REMOVAL OF PLEURAL DRAINAGE CATHETER;  Surgeon: Kerin Perna, MD;  Location: [facility] OR;  Service: Thoracic;  Laterality: Left;   REMOVAL OF PLEURAL DRAINAGE CATHETER Left 08/20/2018   Procedure: REMOVAL OF PLEURAL DRAINAGE CATHETER;  Surgeon: Kerin Perna, MD;  Location: [facility] OR;  Service: Thoracic;  Laterality: Left;   TALC PLEURODESIS Left 07/13/2018   Procedure: Talc Pleuradesis;  Surgeon: Donata Clay, Theron Arista, MD;  Location: [facility] OR;  Service: Thoracic;  Laterality: Left;   TOTAL HIP ARTHROPLASTY Left 06/05/2020   Procedure: LEFT TOTAL HIP ARTHROPLASTY ANTERIOR APPROACH;  Surgeon: Tarry Kos, MD;  Location: [facility];  Service: Orthopedics;  Laterality: Left;  3-C   TUBAL LIGATION Bilateral    UTERINE ABLATION      VIDEO ASSISTED THORACOSCOPY Left 07/13/2018   Procedure: VIDEO ASSISTED THORACOSCOPY;  Surgeon: Donata Clay, Theron Arista, MD;  Location: [facility] OR;  Service: Thoracic;  Laterality: Left;   Social History   Occupational History    Employer: [employer]  Tobacco Use   Smoking status: Former    Packs/day: 0.50    Years: 17.00    Additional pack years: 0.00    Total pack years: 8.50    Types: Cigarettes    Quit date: 06/22/2018    Years since quitting: 4.0   Smokeless tobacco: Never  Vaping Use   Vaping Use: Never used  Substance and Sexual Activity   Alcohol use: Yes    Alcohol/week: 1.0 standard drink of alcohol    Types: 1 Glasses of wine per week    Comment: occasionally   Drug use: No   Sexual activity: Not Currently

## 2022-07-03 DIAGNOSIS — M79671 Pain in right foot: Secondary | ICD-10-CM | POA: Diagnosis not present

## 2022-07-03 DIAGNOSIS — M25571 Pain in right ankle and joints of right foot: Secondary | ICD-10-CM | POA: Diagnosis not present

## 2022-07-22 ENCOUNTER — Other Ambulatory Visit: Payer: Self-pay

## 2022-07-22 MED ORDER — OXYCODONE HCL 20 MG PO TABS: 1.0000 | ORAL_TABLET | ORAL | 0 refills | Status: DC | PRN

## 2022-07-23 ENCOUNTER — Other Ambulatory Visit (HOSPITAL_COMMUNITY): Payer: Self-pay

## 2022-07-25 ENCOUNTER — Other Ambulatory Visit (HOSPITAL_COMMUNITY): Payer: Self-pay

## 2022-07-26 ENCOUNTER — Other Ambulatory Visit (HOSPITAL_COMMUNITY): Payer: Self-pay

## 2022-07-26 ENCOUNTER — Other Ambulatory Visit: Payer: Self-pay | Admitting: Hematology

## 2022-07-26 ENCOUNTER — Other Ambulatory Visit: Payer: Self-pay

## 2022-07-26 MED ORDER — OLAPARIB 150 MG PO TABS
300.0000 mg | ORAL_TABLET | Freq: Two times a day (BID) | ORAL | 2 refills | Status: DC
  Filled 2022-07-26: qty 120, 30d supply, fill #0
  Filled 2022-08-21: qty 120, 30d supply, fill #1
  Filled 2022-09-20: qty 120, 30d supply, fill #2

## 2022-07-29 ENCOUNTER — Other Ambulatory Visit (HOSPITAL_COMMUNITY): Payer: Self-pay

## 2022-08-01 NOTE — Progress Notes (Deleted)
Office Visit Note   Patient: Michelle Holt           Date of Birth: 10-22-1970           MRN: [id number] Visit Date: 08/02/2022              Requested by: Ignatius Specking, MD [address] [place],  [place] [address] PCP: Ignatius Specking, MD   Assessment & Plan: Visit Diagnoses:  1. Status post total replacement of left hip     Plan: ***  Follow-Up Instructions: No follow-ups on file.   Orders:  No orders of the defined types were placed in this encounter.  No orders of the defined types were placed in this encounter.     Procedures: No procedures performed   Clinical Data: No additional findings.   Subjective: No chief complaint on file.   HPI  Review of Systems   Objective: Vital Signs: LMP  (LMP Unknown)   Physical Exam  Ortho Exam  Specialty Comments:  No specialty comments available.  Imaging: No results found.   PMFS History: Patient Active Problem List   Diagnosis Date Noted   Nausea with vomiting 02/11/2022   Mixed stress and urge urinary incontinence 08/02/2021   Microscopic hematuria 08/02/2021   Palliative care by specialist    Goals of care, counseling/discussion    General weakness    Leukopenia 06/23/2021   Thrombocytopenia (HCC) 06/23/2021   Elevated d-dimer 06/23/2021   Low back pain 06/23/2021   Hiatal hernia 06/23/2021   Obesity (BMI 30-39.9) 06/23/2021   Acute bronchospasm 06/23/2021   CAP (community acquired pneumonia) 06/22/2021   GERD (gastroesophageal reflux disease)    Iron deficiency anemia 11/07/2020   Post-op pain 07/12/2020   Splenic lesion 07/12/2020   Status post total replacement of left hip 06/05/2020   Primary osteoarthritis of left hip 02/09/2020   Pain in right knee 02/09/2020   Palliative care patient 09/18/2018   Port-A-Cath in place 08/20/2018   Genetic testing 08/20/2018   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   Splenic laceration 07/31/2018   Family history of uterine  cancer    Family history of breast cancer    Macrocytic anemia 07/18/2018   Ovarian cancer, bilateral (HCC) 07/18/2018   HCAP (healthcare-associated pneumonia) 07/18/2018   Pleural effusion 07/07/2018   Migraine 07/06/2018   Pleural effusion on left 07/06/2018   Acute respiratory failure with hypoxia (HCC) 07/06/2018   Tobacco abuse 07/06/2018   Generalized anxiety disorder 05/02/2014   Unspecified constipation 08/10/2013   Lumbosacral spondylosis without myelopathy 10/23/2011   Intractable migraine without aura 10/23/2011   Past Medical History:  Diagnosis Date   Anemia    Anxiety and depression    Arthritis of facet joints at multiple vertebral levels    L5-S1   Constipation    Dyslipidemia    Family history of breast cancer    Family history of uterine cancer    GERD (gastroesophageal reflux disease)    History of hiatal hernia    History of kidney stones    Insomnia    Irritable bowel syndrome    Migraine    Muscle tension headache    Neuropathy of finger    Ovarian carcinoma (HCC)    ovarian   Plantar fasciitis of right foot    Port-A-Cath in place 08/20/2018    Family History  Problem Relation Age of Onset   Depression Mother    Hypertension Mother    Obesity  Mother    Diabetes Mother    Kidney disease Mother    Peripheral vascular disease Father    Atrial fibrillation Father    Crohn's disease Sister    Uterine cancer Sister 59       maternal half sister   COPD Brother    Osteoporosis Brother    Breast cancer Maternal Aunt 17   Colon cancer Neg Hx     Past Surgical History:  Procedure Laterality Date   ABDOMINAL HYSTERECTOMY     BIOPSY  10/27/2020   Procedure: BIOPSY;  Surgeon: Dolores Frame, MD;  [facility];  Service: Gastroenterology;;   CHOLECYSTECTOMY  2008   COLONOSCOPY N/A 08/13/2013   Procedure: COLONOSCOPY;  Surgeon: Malissa Hippo, MD;  [facility];  Service: Endoscopy;  Laterality: N/A;  230-moved to  145 Ann to notify pt   COLONOSCOPY WITH PROPOFOL N/A 11/15/2020   Procedure: COLONOSCOPY WITH PROPOFOL;  Surgeon: Malissa Hippo, MD;  [facility];  Service: Endoscopy;  Laterality: N/A;  1:40   ESOPHAGEAL DILATION N/A 02/28/2021   Procedure: ESOPHAGEAL DILATION;  Surgeon: Malissa Hippo, MD;  [facility];  Service: Endoscopy;  Laterality: N/A;   ESOPHAGOGASTRODUODENOSCOPY     ESOPHAGOGASTRODUODENOSCOPY (EGD) WITH PROPOFOL N/A 07/20/2018   Procedure: ESOPHAGOGASTRODUODENOSCOPY (EGD) WITH PROPOFOL;  Surgeon: Malissa Hippo, MD;  [facility];  Service: Endoscopy;  Laterality: N/A;  Possible esophageal dilation.   ESOPHAGOGASTRODUODENOSCOPY (EGD) WITH PROPOFOL N/A 10/27/2020   Procedure: ESOPHAGOGASTRODUODENOSCOPY (EGD) WITH PROPOFOL;  Surgeon: Dolores Frame, MD;  [facility];  Service: Gastroenterology;  Laterality: N/A;  2:10, pt knows to arrive at 10:15   ESOPHAGOGASTRODUODENOSCOPY (EGD) WITH PROPOFOL N/A 02/28/2021   Procedure: ESOPHAGOGASTRODUODENOSCOPY (EGD) WITH PROPOFOL;  Surgeon: Malissa Hippo, MD;  [facility];  Service: Endoscopy;  Laterality: N/A;  200 ASA 1   ESOPHAGOGASTRODUODENOSCOPY (EGD) WITH PROPOFOL N/A 03/29/2022   Procedure: ESOPHAGOGASTRODUODENOSCOPY (EGD) WITH PROPOFOL;  Surgeon: Dolores Frame, MD;  [facility];  Service: Gastroenterology;  Laterality: N/A;  2:00 pm, asa 1-2   GIVENS CAPSULE STUDY N/A 11/24/2020   Procedure: GIVENS CAPSULE STUDY;  Surgeon: Malissa Hippo, MD;  [facility];  Service: Endoscopy;  Laterality: N/A;  7:30   IR ANGIOGRAM SELECTIVE EACH ADDITIONAL VESSEL  08/01/2018   IR ANGIOGRAM VISCERAL SELECTIVE  08/01/2018   IR EMBO ART  VEN HEMORR LYMPH EXTRAV  INC GUIDE ROADMAPPING  08/01/2018   IR IMAGING GUIDED PORT INSERTION  08/20/2018   IR PERC PLEURAL DRAIN W/INDWELL CATH W/IMG GUIDE  07/08/2018   IR THORACENTESIS ASP PLEURAL SPACE W/IMG GUIDE   07/07/2018   IR US GUIDE VASC ACCESS RIGHT  08/01/2018   PLEURAL EFFUSION DRAINAGE Left 07/13/2018   Procedure: DRAINAGE OF LOCULATED PLEURAL EFFUSION;  Surgeon: Kerin Perna, MD;  Location: [facility] OR;  Service: Thoracic;  Laterality: Left;   POLYPECTOMY  11/15/2020   Procedure: POLYPECTOMY;  Surgeon: Malissa Hippo, MD;  [facility];  Service: Endoscopy;;   REMOVAL OF PLEURAL DRAINAGE CATHETER Left 08/20/2018   Procedure: REMOVAL OF PLEURAL DRAINAGE CATHETER;  Surgeon: Kerin Perna, MD;  Location: [facility] OR;  Service: Thoracic;  Laterality: Left;   REMOVAL OF PLEURAL DRAINAGE CATHETER Left 08/20/2018   Procedure: REMOVAL OF PLEURAL DRAINAGE CATHETER;  Surgeon: Kerin Perna, MD;  Location: [facility] OR;  Service: Thoracic;  Laterality: Left;   TALC PLEURODESIS Left 07/13/2018  Procedure: Talc Pleuradesis;  Surgeon: Donata Clay, Theron Arista, MD;  Location: [facility];  Service: Thoracic;  Laterality: Left;   TOTAL HIP ARTHROPLASTY Left 06/05/2020   Procedure: LEFT TOTAL HIP ARTHROPLASTY ANTERIOR APPROACH;  Surgeon: Tarry Kos, MD;  Location: [facility] OR;  Service: Orthopedics;  Laterality: Left;  3-C   TUBAL LIGATION Bilateral    UTERINE ABLATION     VIDEO ASSISTED THORACOSCOPY Left 07/13/2018   Procedure: VIDEO ASSISTED THORACOSCOPY;  Surgeon: Donata Clay, Theron Arista, MD;  Location: [facility] OR;  Service: Thoracic;  Laterality: Left;   Social History   Occupational History    Employer: [employer]  Tobacco Use   Smoking status: Former    Current packs/day: 0.00    Average packs/day: 0.5 packs/day for 17.0 years (8.5 ttl pk-yrs)    Types: Cigarettes    Start date: 06/21/2001    Quit date: 06/22/2018    Years since quitting: 4.1   Smokeless tobacco: Never  Vaping Use   Vaping status: Never Used  Substance and Sexual Activity   Alcohol use: Yes    Alcohol/week: 1.0 standard drink of alcohol    Types: 1 Glasses of wine per week    Comment: occasionally   Drug use: No   Sexual activity: Not Currently

## 2022-08-02 ENCOUNTER — Ambulatory Visit: Payer: 59 | Admitting: Orthopaedic Surgery

## 2022-08-02 DIAGNOSIS — Z96642 Presence of left artificial hip joint: Secondary | ICD-10-CM

## 2022-08-05 ENCOUNTER — Encounter (HOSPITAL_COMMUNITY): Payer: Self-pay | Admitting: Radiology

## 2022-08-05 ENCOUNTER — Ambulatory Visit (HOSPITAL_COMMUNITY)
Admission: RE | Admit: 2022-08-05 | Discharge: 2022-08-05 | Disposition: A | Discharge status: Home / Self Care | Payer: 59 | Source: Ambulatory Visit | Attending: Hematology | Admitting: Hematology

## 2022-08-05 ENCOUNTER — Inpatient Hospital Stay: Payer: 59 | Attending: Hematology

## 2022-08-05 DIAGNOSIS — R1012 Left upper quadrant pain: Secondary | ICD-10-CM | POA: Diagnosis not present

## 2022-08-05 DIAGNOSIS — Z803 Family history of malignant neoplasm of breast: Secondary | ICD-10-CM | POA: Insufficient documentation

## 2022-08-05 DIAGNOSIS — D649 Anemia, unspecified: Secondary | ICD-10-CM

## 2022-08-05 DIAGNOSIS — Z90722 Acquired absence of ovaries, bilateral: Secondary | ICD-10-CM | POA: Insufficient documentation

## 2022-08-05 DIAGNOSIS — Z9221 Personal history of antineoplastic chemotherapy: Secondary | ICD-10-CM | POA: Diagnosis not present

## 2022-08-05 DIAGNOSIS — C563 Malignant neoplasm of bilateral ovaries: Secondary | ICD-10-CM | POA: Diagnosis not present

## 2022-08-05 DIAGNOSIS — Z87891 Personal history of nicotine dependence: Secondary | ICD-10-CM | POA: Diagnosis not present

## 2022-08-05 DIAGNOSIS — C786 Secondary malignant neoplasm of retroperitoneum and peritoneum: Secondary | ICD-10-CM | POA: Diagnosis not present

## 2022-08-05 DIAGNOSIS — K5900 Constipation, unspecified: Secondary | ICD-10-CM | POA: Insufficient documentation

## 2022-08-05 DIAGNOSIS — M545 Low back pain, unspecified: Secondary | ICD-10-CM | POA: Diagnosis not present

## 2022-08-05 DIAGNOSIS — D509 Iron deficiency anemia, unspecified: Secondary | ICD-10-CM | POA: Insufficient documentation

## 2022-08-05 DIAGNOSIS — K449 Diaphragmatic hernia without obstruction or gangrene: Secondary | ICD-10-CM | POA: Diagnosis not present

## 2022-08-05 DIAGNOSIS — Z8049 Family history of malignant neoplasm of other genital organs: Secondary | ICD-10-CM | POA: Diagnosis not present

## 2022-08-05 DIAGNOSIS — Z90710 Acquired absence of both cervix and uterus: Secondary | ICD-10-CM | POA: Diagnosis not present

## 2022-08-05 DIAGNOSIS — R200 Anesthesia of skin: Secondary | ICD-10-CM | POA: Insufficient documentation

## 2022-08-05 DIAGNOSIS — Z9079 Acquired absence of other genital organ(s): Secondary | ICD-10-CM | POA: Diagnosis not present

## 2022-08-05 DIAGNOSIS — K760 Fatty (change of) liver, not elsewhere classified: Secondary | ICD-10-CM | POA: Diagnosis not present

## 2022-08-05 LAB — COMPREHENSIVE METABOLIC PANEL
ALT: 20 U/L (ref 0–44)
AST: 28 U/L (ref 15–41)
Albumin: 3.7 g/dL (ref 3.5–5.0)
Alkaline Phosphatase: 99 U/L (ref 38–126)
Anion gap: 9 (ref 5–15)
BUN: 12 mg/dL (ref 6–20)
CO2: 25 mmol/L (ref 22–32)
Calcium: 8.8 mg/dL — ABNORMAL LOW (ref 8.9–10.3)
Chloride: 97 mmol/L — ABNORMAL LOW (ref 98–111)
Creatinine, Ser: 0.61 mg/dL (ref 0.44–1.00)
GFR, Estimated: >60 mL/min (ref >60)
Glucose, Bld: 116 mg/dL — ABNORMAL HIGH (ref 70–99)
Potassium: 3.7 mmol/L (ref 3.5–5.1)
Sodium: 131 mmol/L — ABNORMAL LOW (ref 135–145)
Total Bilirubin: 0.5 mg/dL (ref 0.3–1.2)
Total Protein: 6.8 g/dL (ref 6.5–8.1)

## 2022-08-05 LAB — IRON AND TIBC
Iron: 94 ug/dL (ref 28–170)
Saturation Ratios: 29 % (ref 10.4–31.8)
TIBC: 329 ug/dL (ref 250–450)
UIBC: 235 ug/dL

## 2022-08-05 LAB — CBC WITH DIFFERENTIAL/PLATELET
Abs Immature Granulocytes: 0.03 10*3/uL (ref 0.00–0.07)
Basophils Absolute: 0 10*3/uL (ref 0.0–0.1)
Basophils Relative: 0 %
Eosinophils Absolute: 0.2 10*3/uL (ref 0.0–0.5)
Eosinophils Relative: 3 %
HCT: 39.5 % (ref 36.0–46.0)
Hemoglobin: 13.7 g/dL (ref 12.0–15.0)
Immature Granulocytes: 0 %
Lymphocytes Relative: 27 %
Lymphs Abs: 1.9 10*3/uL (ref 0.7–4.0)
MCH: 39.8 pg — ABNORMAL HIGH (ref 26.0–34.0)
MCHC: 34.7 g/dL (ref 30.0–36.0)
MCV: 114.8 fL — ABNORMAL HIGH (ref 80.0–100.0)
Monocytes Absolute: 0.5 10*3/uL (ref 0.1–1.0)
Monocytes Relative: 7 %
Neutro Abs: 4.5 10*3/uL (ref 1.7–7.7)
Neutrophils Relative %: 63 %
Platelets: 255 10*3/uL (ref 150–400)
RBC: 3.44 MIL/uL — ABNORMAL LOW (ref 3.87–5.11)
RDW: 14.8 % (ref 11.5–15.5)
WBC: 7.2 10*3/uL (ref 4.0–10.5)
nRBC: 0.3 % — ABNORMAL HIGH (ref 0.0–0.2)

## 2022-08-05 LAB — MAGNESIUM: Magnesium: 1.7 mg/dL (ref 1.7–2.4)

## 2022-08-05 LAB — FERRITIN: Ferritin: 219 ng/mL (ref 11–307)

## 2022-08-05 MED ORDER — SODIUM CHLORIDE 0.9% FLUSH
10.0000 mL | Freq: Once | INTRAVENOUS | Status: AC
  Administered 2022-08-05: 10 mL via INTRAVENOUS

## 2022-08-05 MED ORDER — HEPARIN SOD (PORK) LOCK FLUSH 100 UNIT/ML IV SOLN: 500.0000 [IU] | Freq: Once | INTRAVENOUS | Status: DC

## 2022-08-05 MED ORDER — IOHEXOL 300 MG/ML  SOLN
100.0000 mL | Freq: Once | INTRAMUSCULAR | Status: AC | PRN
  Administered 2022-08-05: 100 mL via INTRAVENOUS

## 2022-08-05 NOTE — Patient Instructions (Signed)

## 2022-08-05 NOTE — Progress Notes (Signed)
Labs drawn for CT scan.  Patients port flushed without difficulty.  Good blood return noted with no bruising or swelling noted at site.  VSS with discharge and left in satisfactory condition with no s/s of distress noted.

## 2022-08-06 LAB — CA 125: Cancer Antigen (CA) 125: 9.7 U/mL (ref 0.0–38.1)

## 2022-08-08 DIAGNOSIS — Z01818 Encounter for other preprocedural examination: Secondary | ICD-10-CM | POA: Diagnosis not present

## 2022-08-08 DIAGNOSIS — C786 Secondary malignant neoplasm of retroperitoneum and peritoneum: Secondary | ICD-10-CM | POA: Diagnosis not present

## 2022-08-08 DIAGNOSIS — G43019 Migraine without aura, intractable, without status migrainosus: Secondary | ICD-10-CM | POA: Diagnosis not present

## 2022-08-08 DIAGNOSIS — D509 Iron deficiency anemia, unspecified: Secondary | ICD-10-CM | POA: Diagnosis not present

## 2022-08-08 DIAGNOSIS — Z79899 Other long term (current) drug therapy: Secondary | ICD-10-CM | POA: Diagnosis not present

## 2022-08-08 DIAGNOSIS — K219 Gastro-esophageal reflux disease without esophagitis: Secondary | ICD-10-CM | POA: Diagnosis not present

## 2022-08-08 DIAGNOSIS — R112 Nausea with vomiting, unspecified: Secondary | ICD-10-CM | POA: Diagnosis not present

## 2022-08-08 DIAGNOSIS — Z87891 Personal history of nicotine dependence: Secondary | ICD-10-CM | POA: Diagnosis not present

## 2022-08-08 DIAGNOSIS — K449 Diaphragmatic hernia without obstruction or gangrene: Secondary | ICD-10-CM | POA: Diagnosis not present

## 2022-08-08 DIAGNOSIS — C563 Malignant neoplasm of bilateral ovaries: Secondary | ICD-10-CM | POA: Diagnosis not present

## 2022-08-08 DIAGNOSIS — Z95828 Presence of other vascular implants and grafts: Secondary | ICD-10-CM | POA: Diagnosis not present

## 2022-08-11 NOTE — Progress Notes (Signed)
[facility] [address] [place] [address]    Clinic Day:  08/11/2022  Referring physician: Ignatius Specking, MD  Patient Care Team: Michelle Specking, MD as PCP - General (Internal Medicine) Michelle Massed, MD as Medical Oncologist (Oncology)   ASSESSMENT & PLAN:   Assessment: 1.  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 -Robotic assisted laparoscopic TAH and BSO and omentectomy on 12/24/2018, pathology showing high-grade serous carcinoma, PT3P NX. -Germline mutation testing was negative. -3 more cycles of adjuvant chemotherapy completed on 03/15/2019. -CTAP on 04/13/2019 showed no findings of active malignancy.  28% reduction in the volume of presumed chronic hematoma/chronic fluid collection splaying the upper margin of the spleen.  Large type III hiatal hernia. -CTAP on 10/13/2019 shows no findings of recurrence or metastatic disease. - Foundation 1 shows HRD+, LOH score>16%.  MSI-stable.  MYC amplification.  T p53 mutation. - We reviewed CT CAP from 02/21/2021 which showed progressive peritoneal metastasis compared to 12/06/2020 scan.  No bowel obstruction.  Small right pleural effusion with mild enlargement of an isolated left external iliac lymph node. - Reviewed EGD from 02/28/2021 which showed hiatal hernia and normal findings. - She reported epigastric and left upper quadrant pain worse in the last 1 week which is related to progression of her malignancy. - 6 cycles of carboplatin and paclitaxel from 03/13/2021 through 06/18/2021 - CT CAP (07/12/2021): No evidence of recurrence or metastatic disease. - Olaparib 300 mg twice daily started on 08/22/2021.    Plan: 1.  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 -  She is tolerating olaparib very well. - [place] and had sustained tibia and fibula fracture of the right leg. - Reviewed labs from 05/27/2022: Normal LFTs and creatinine.  CBC grossly normal.  CEA 125 is 6.8. - She is seeing her surgeon at [facility] for hiatal hernia on 06/14/2022. - Continue olaparib 300 mg twice daily. - RTC 2 months for follow-up with repeat CTAP with contrast and CA125 level.   2.  Lower back/left upper quadrant pain: - Continue oxycodone 20 mg every 4-6 hours as needed.   3.  Numbness in the feet/cramping in the hands: - Continue gabapentin 200 mg at bedtime.  4.  Iron deficiency state: - Last Feraheme in September 2023. - Ferritin decreased to 67 from 114.  She complains of worsening fatigue.  Recommend Feraheme x 2.  5.  Constipation: - Continue stool softener twice daily.  6.  Hypomagnesemia: - Continue magnesium twice daily.    No orders of the defined types were placed in this encounter.     [facility]le [facility] for Michelle Massed, MD.,have documented all relevant documentation on the behalf of Michelle Massed, MD,as directed by  Michelle Massed, MD while in the presence of Michelle Massed, MD.  ***   [place] [facility]   7/28/202411:42 PM  CHIEF COMPLAINT:   Diagnosis: high-grade serous ovarian cancer    Cancer Staging  No matching staging information was found for the patient.    Prior Therapy: 1. Carboplatin and paclitaxel x 7 cycles from 08/24/2018 to 03/15/2019. 2. Laparoscopic TAH & BSO & omenectomy on 12/24/2018. 3.  6 cycles of carboplatin and paclitaxel from 03/05/2021 to 06/18/2021  Current Therapy:  olaparib  HISTORY OF PRESENT ILLNESS:   Oncology History  Ovarian cancer, bilateral (HCC)  07/07/2018 Pathology Results   PLEURAL FLUID, LEFT (SPECIMEN 1 OF 1, COLLECTED 07/07/18): - MALIGNANT CELLS CONSISTENT WITH ADENOCARCINOMA - SEE COMMENT  Source Pleural Fluid, (Specimen 1 of 1,  collected on 07/07/2018) Gross Specimen: Received is/are 1000cc of bloody red fluid with tissue. (TC:tc) Prepared: # Smears: 0 # Concentration Technique Slides (i.e. ThinPrep): 1 # Cell Block: 1 Conventional Additional Studies: Two Hematology slides labeled [id number] Comment The malignant cells are positive for cytokeratin 7, p53, WT-1, Pax-8, Moc31, ER (weak) and EMA but negative for cytokeratin 20, TTF-1, GATA-3, CDX-2 and D2-40. Overall, the phenotype is consistent with a gynecologic primary; clinical correlation recommended.   07/07/2018 Procedure   Successful ultrasound guided left thoracentesis yielding 2.0 L of pleural fluid   07/08/2018 Procedure   1. Technically successful placement of left 14 French pigtail chest drain, placed to Pleur-evac water-seal.   07/08/2018 Procedure   1. Technically successful five [place] double lumen power injectable PICC placement   07/09/2018 Imaging   Ct chest 1. There is a moderate, loculated left hydropneumothorax with a small air component and moderate fluid component. The largest loculated component is located posteriorly. There is a pigtail drainage catheter about the lateral pleural space. There is no obvious etiology, such as obvious mass or pleural disease.   2. There is a small right pleural effusion with associated atelectasis or consolidation and a subpleural consolidation of the superior segment right lower lobe (series 4, image 56), of uncertain significance, possibly infectious or inflammatory   07/10/2018 Imaging   Ct abdomen and pelvis: 1. The bilateral ovaries are enlarged by heterogeneous appearing cystic lesions, measuring 5.3 x 4.2 cm on the right (series 4, image 72) and 4.5 x 3.2 cm on the left (series 4, image 75). Consider dedicated pelvic ultrasound and/or pelvic MRI to further evaluate for solid components given high suspicion for GYN primary malignancy.   2. No other evidence of mass and no lymphadenopathy in the abdomen or  pelvis.   3. Trace ascites. There is some suggestion of omental and peritoneal nodularity (e.g. Series 4, image 43), concerning for peritoneal metastatic disease.    4. Loculated left-sided pleural effusion with left-sided pleural drainage catheter in position. Small right pleural effusion   07/13/2018 Surgery   OPERATION: 1.  Left VATS (video-assisted thoracoscopic surgery) for drainage of loculated pleural effusion. 2.  Talc pleurodesis for malignant pleural effusion. 3.  Placement of PleurX catheter for management of malignant pleural effusion. 4.  Placement of On-Q analgesia catheter system.    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   07/13/2018 Pathology Results   Pleura, peel, Left Pleural - FIBRO-FIBRINOUS PLEURITIS - NEGATIVE FOR MALIGNANCY   07/18/2018 Initial Diagnosis   Ovarian cancer, bilateral (HCC)   07/20/2018 Procedure   EGD impression: Normal proximal esophagus and mid esophagus. Mild distal esophageal rings; dilation not performed because of esophagitis. LA Grade C reflux esophagitis. Z-line regular, 30 cm from the incisors. 5 cm hiatal hernia. Non-bleeding gastric ulcer with no stigmata of bleeding. Gastritis. Duodenal erosions without bleeding. Normal second portion of the duodenum. No specimens collected.   08/19/2018 Genetic Testing   Negative genetic testing on the common hereditary cancer panel.  The Common Hereditary Gene Panel offered by [facility] includes sequencing and/or deletion duplication testing  of the following 48 genes: APC, ATM, AXIN2, BARD1, BMPR1A, BRCA1, BRCA2, BRIP1, CDH1, CDK4, CDKN2A (p14ARF), CDKN2A (p16INK4a), CHEK2, CTNNA1, DICER1, EPCAM (Deletion/duplication testing only), GREM1 (promoter region deletion/duplication testing only), KIT, MEN1, MLH1, MSH2,  MSH3, MSH6, MUTYH, NBN, NF1, NHTL1, PALB2, PDGFRA, PMS2, POLD1, POLE, PTEN, RAD50, RAD51C, RAD51D, RNF43, SDHB, SDHC, SDHD, SMAD4, SMARCA4. STK11, TP53, TSC1, TSC2, and VHL.  The following genes were evaluated for sequence changes only: SDHA and HOXB13 c.251G>A variant only. The report date is August 19, 2018.   08/24/2018 - 03/15/2019 Chemotherapy   Patient is on Treatment Plan : OVARIAN Carboplatin (AUC 6) / Paclitaxel (175) q21d x 6 cycles     03/05/2021 - 06/18/2021 Chemotherapy   Patient is on Treatment Plan : OVARIAN Carboplatin (AUC 6) / Paclitaxel (175) q21d x 6 cycles        INTERVAL HISTORY:   Michelle [facility] is a 52 y.o. female presenting to clinic today for follow up of high-grade serous ovarian cancer. She was last seen by me on 06/03/22.  She had a CT A/P on 7/22 that found: ***. She has a laparoscopy scheduled with Dr. Michail Sermon on 08/22/22 for repair of a paraesophageal hernia.   Today, she states that she is doing well overall. Her appetite level is at ***%. Her energy level is at ***%.  PAST MEDICAL HISTORY:   Past Medical History: Past Medical History:  Diagnosis Date   Anemia    Anxiety and depression    Arthritis of facet joints at multiple vertebral levels    L5-S1   Constipation    Dyslipidemia    Family history of breast cancer    Family history of uterine cancer    GERD (gastroesophageal reflux disease)    History of hiatal hernia    History of kidney stones    Insomnia    Irritable bowel syndrome    Migraine    Muscle tension headache    Neuropathy of finger    Ovarian carcinoma (HCC)    ovarian   Plantar fasciitis of right foot    Port-A-Cath in place 08/20/2018    Surgical History: Past Surgical History:  Procedure Laterality Date   ABDOMINAL HYSTERECTOMY     BIOPSY  10/27/2020   Procedure: BIOPSY;  Surgeon: Dolores Frame, MD;  [facility];  Service: Gastroenterology;;   CHOLECYSTECTOMY  2008   COLONOSCOPY N/A 08/13/2013   Procedure:  COLONOSCOPY;  Surgeon: Malissa Hippo, MD;  [facility]: [facility];  Service: Endoscopy;  Laterality: N/A;  230-moved to 145 Ann to notify pt   COLONOSCOPY WITH PROPOFOL N/A 11/15/2020   Procedure: COLONOSCOPY WITH PROPOFOL;  Surgeon: Malissa Hippo, MD;  [facility]: [facility];  Service: Endoscopy;  Laterality: N/A;  1:40   ESOPHAGEAL DILATION N/A 02/28/2021   Procedure: ESOPHAGEAL DILATION;  Surgeon: Malissa Hippo, MD;  [facility];  Service: Endoscopy;  Laterality: N/A;   ESOPHAGOGASTRODUODENOSCOPY     ESOPHAGOGASTRODUODENOSCOPY (EGD) WITH PROPOFOL N/A 07/20/2018   Procedure: ESOPHAGOGASTRODUODENOSCOPY (EGD) WITH PROPOFOL;  Surgeon: Malissa Hippo, MD;  [facility];  Service: Endoscopy;  Laterality: N/A;  Possible esophageal dilation.   ESOPHAGOGASTRODUODENOSCOPY (EGD) WITH PROPOFOL N/A 10/27/2020   Procedure: ESOPHAGOGASTRODUODENOSCOPY (EGD) WITH PROPOFOL;  Surgeon: Dolores Frame, MD;  [facility]: [facility];  Service: Gastroenterology;  Laterality: N/A;  2:10, pt knows to arrive at 10:15   ESOPHAGOGASTRODUODENOSCOPY (EGD) WITH PROPOFOL N/A 02/28/2021   Procedure: ESOPHAGOGASTRODUODENOSCOPY (EGD) WITH PROPOFOL;  Surgeon: Lionel December  U, MD;  [facility];  Service: Endoscopy;  Laterality: N/A;  200 ASA 1   ESOPHAGOGASTRODUODENOSCOPY (EGD) WITH PROPOFOL N/A 03/29/2022   Procedure: ESOPHAGOGASTRODUODENOSCOPY (EGD) WITH PROPOFOL;  Surgeon: Dolores Frame, MD;  [facility];  Service: Gastroenterology;  Laterality: N/A;  2:00 pm, asa 1-2   GIVENS CAPSULE STUDY N/A 11/24/2020   Procedure: GIVENS CAPSULE STUDY;  Surgeon: Malissa Hippo, MD;  [facility]: [facility];  Service: Endoscopy;  Laterality: N/A;  7:30   IR ANGIOGRAM SELECTIVE EACH ADDITIONAL VESSEL  08/01/2018   IR ANGIOGRAM VISCERAL SELECTIVE  08/01/2018   IR EMBO ART  VEN HEMORR LYMPH EXTRAV  INC GUIDE ROADMAPPING  08/01/2018   IR IMAGING GUIDED PORT INSERTION   08/20/2018   IR PERC PLEURAL DRAIN W/INDWELL CATH W/IMG GUIDE  07/08/2018   IR THORACENTESIS ASP PLEURAL SPACE W/IMG GUIDE  07/07/2018   IR US GUIDE VASC ACCESS RIGHT  08/01/2018   PLEURAL EFFUSION DRAINAGE Left 07/13/2018   Procedure: DRAINAGE OF LOCULATED PLEURAL EFFUSION;  Surgeon: Kerin Perna, MD;  Location: [facility];  Service: Thoracic;  Laterality: Left;   POLYPECTOMY  11/15/2020   Procedure: POLYPECTOMY;  Surgeon: Malissa Hippo, MD;  [facility];  Service: Endoscopy;;   REMOVAL OF PLEURAL DRAINAGE CATHETER Left 08/20/2018   Procedure: REMOVAL OF PLEURAL DRAINAGE CATHETER;  Surgeon: Kerin Perna, MD;  Location[facility];  Service: Thoracic;  Laterality: Left;   REMOVAL OF PLEURAL DRAINAGE CATHETER Left 08/20/2018   Procedure: REMOVAL OF PLEURAL DRAINAGE CATHETER;  Surgeon: Kerin Perna, MD;  Location: [facility] OR;  Service: Thoracic;  Laterality: Left;   TALC PLEURODESIS Left 07/13/2018   Procedure: Talc Pleuradesis;  Surgeon: Donata Clay, Theron Arista, MD;  Location: [facility] OR;  Service: Thoracic;  Laterality: Left;   TOTAL HIP ARTHROPLASTY Left 06/05/2020   Procedure: LEFT TOTAL HIP ARTHROPLASTY ANTERIOR APPROACH;  Surgeon: Tarry Kos, MD;  Location: [facility] OR;  Service: Orthopedics;  Laterality: Left;  3-C   TUBAL LIGATION Bilateral    UTERINE ABLATION     VIDEO ASSISTED THORACOSCOPY Left 07/13/2018   Procedure: VIDEO ASSISTED THORACOSCOPY;  Surgeon: Kerin Perna, MD;  Location: [facility] OR;  Service: Thoracic;  Laterality: Left;    Social History: Social History   Socioeconomic History   Marital status: Widowed    Spouse name: Not on file   Number of children: 2   Years of education: 2-College   Highest education level: Not on file  Occupational History    Employer: [employer]  Tobacco Use   Smoking status: Former    Current packs/day: 0.00    Average packs/day: 0.5 packs/day for 17.0 years (8.5 ttl pk-yrs)    Types: Cigarettes    Start date: 06/21/2001    Quit date: 06/22/2018    Years  since quitting: 4.1   Smokeless tobacco: Never  Vaping Use   Vaping status: Never Used  Substance and Sexual Activity   Alcohol use: Yes    Alcohol/week: 1.0 standard drink of alcohol    Types: 1 Glasses of wine per week    Comment: occasionally   Drug use: No   Sexual activity: Not Currently  Other Topics Concern   Not on file  Social History Narrative   Patient lives at home with her daughter.    Patient has 2 children.    Patient is widowed.    Patient is right handed.    Patient has her Associates degree.  Social Determinants of Health   Financial Resource Strain: Low Risk  (07/22/2018)   Overall Financial Resource Strain (CARDIA)    Difficulty of Paying Living Expenses: Not hard at all  Food Insecurity: No Food Insecurity (08/08/2022)   Received from [facility]   Hunger Vital Sign    Worried About Running Out of Food in the Last Year: Never true    Ran Out of Food in the Last Year: Never true  Transportation Needs: No Transportation Needs (07/22/2018)   PRAPARE - Administrator, Civil Service (Medical): No    Lack of Transportation (Non-Medical): No  Physical Activity: Inactive (07/22/2018)   Exercise Vital Sign    Days of Exercise per Week: 0 days    Minutes of Exercise per Session: 0 min  Stress: Stress Concern Present (07/22/2018)   [employer] of Occupational Health - Occupational Stress Questionnaire    Feeling of Stress : Very much  Social Connections: Moderately Isolated (07/22/2018)   Social Connection and Isolation Panel [NHANES]    Frequency of Communication with Friends and Family: More than three times a week    Frequency of Social Gatherings with Friends and Family: More than three times a week    Attends Religious Services: 1 to 4 times per year    Active Member of [employer] or Organizations: No    Attends Banker Meetings: Never    Marital Status: Widowed  Intimate Partner Violence: Not At Risk (07/22/2018)    Humiliation, Afraid, Rape, and Kick questionnaire    Fear of Current or Ex-Partner: No    Emotionally Abused: No    Physically Abused: No    Sexually Abused: No    Family History: Family History  Problem Relation Age of Onset   Depression Mother    Hypertension Mother    Obesity Mother    Diabetes Mother    Kidney disease Mother    Peripheral vascular disease Father    Atrial fibrillation Father    Crohn's disease Sister    Uterine cancer Sister 76       maternal half sister   COPD Brother    Osteoporosis Brother    Breast cancer Maternal Aunt 50   Colon cancer Neg Hx     Current Medications:  Current Outpatient Medications:    albuterol (VENTOLIN HFA) 108 (90 Base) MCG/ACT inhaler, Inhale 2 puffs into the lungs every 6 (six) hours as needed for wheezing or shortness of breath., Disp: , Rfl:    B Complex-C (B-COMPLEX WITH VITAMIN C) tablet, Take 1 tablet by mouth daily., Disp: , Rfl:    Bioflavonoid Products (VITAMIN C) CHEW, Chew 1 tablet by mouth daily., Disp: , Rfl:    Cholecalciferol (VITAMIN D) 50 MCG (2000 UT) CAPS, Take 2,000 Units by mouth daily., Disp: , Rfl:    Cyanocobalamin (B-12) 3000 MCG CAPS, Take 3,000 mcg by mouth every other day., Disp: , Rfl:    diazepam (VALIUM) 5 MG tablet, TAKE ONE TABLET BY MOUTH AT BEDTIME, Disp: 30 tablet, Rfl: 5   dicyclomine (BENTYL) 10 MG capsule, Take 1 capsule (10 mg total) by mouth 3 (three) times daily as needed for spasms., Disp: 90 capsule, Rfl: 3   docusate sodium (COLACE) 250 MG capsule, Take 250 mg by mouth 2 (two) times daily., Disp: , Rfl:    folic acid (FOLVITE) 1 MG tablet, Take 1 tablet (1 mg total) by mouth daily., Disp: 30 tablet, Rfl: 1   folic acid (  FOLVITE) 400 MCG tablet, Take 400 mcg by mouth daily., Disp: , Rfl:    gabapentin (NEURONTIN) 100 MG capsule, TAKE TWO(2) CAPSULES BY MOUTH AT BEDTIME, Disp: 60 capsule, Rfl: 3   linaclotide (LINZESS) 290 MCG CAPS capsule, Take 1 capsule (290 mcg total) by mouth daily  before breakfast., Disp: 30 capsule, Rfl: 6   loratadine (CLARITIN) 10 MG tablet, Take 10 mg by mouth daily as needed for allergies., Disp: , Rfl:    LORazepam (ATIVAN) 0.5 MG tablet, Take 0.5 mg by mouth 3 (three) times daily as needed for anxiety., Disp: , Rfl:    magic mouthwash w/lidocaine SOLN, Take 5 mLs by mouth 3 (three) times daily as needed for mouth pain (Swish and spit)., Disp: 480 mL, Rfl: 6   magnesium oxide (MAG-OX) 400 (240 Mg) MG tablet, Take 1 tablet (400 mg total) by mouth 2 (two) times daily., Disp: 60 tablet, Rfl: 6   olaparib (LYNPARZA) 150 MG tablet, Take 2 tablets (300 mg total) by mouth 2 (two) times daily. Swallow whole. May take with food to decrease nausea and vomiting., Disp: 120 tablet, Rfl: 2   omeprazole (PRILOSEC) 40 MG capsule, TAKE ONE CAPSULE BY MOUTH TWICE DAILY BEFORE MEALS, Disp: 60 capsule, Rfl: 5   ondansetron (ZOFRAN) 4 MG tablet, TAKE ONE TABLET BY MOUTH EVERY 8 HOURS AS NEEDED FOR NAUSEA OR VOMITING, Disp: 40 tablet, Rfl: 3   Oxycodone HCl 20 MG TABS, Take 1 tablet (20 mg total) by mouth every 4 (four) hours as needed., Disp: 180 tablet, Rfl: 0   prochlorperazine (COMPAZINE) 10 MG tablet, Take 1 tablet (10 mg total) by mouth 2 (two) times daily as needed for nausea or vomiting. Take 30 minutes before Lynparza twice daily., Disp: 60 tablet, Rfl: 3   promethazine (PHENERGAN) 25 MG tablet, Take 1 tablet (25 mg total) by mouth every 6 (six) hours as needed for nausea or vomiting. (Patient taking differently: Take 25 mg by mouth at bedtime.), Disp: 60 tablet, Rfl: 4   rivaroxaban (XARELTO) 10 MG TABS tablet, Take 1 tablet (10 mg total) by mouth daily., Disp: 14 tablet, Rfl: 0   SUMAtriptan (IMITREX) 100 MG tablet, TAKE ONE TABLET BY MOUTH PRN UP to TWICE DAILY AS NEEDED. (Patient taking differently: Take 100 mg by mouth 2 (two) times daily as needed for migraine.), Disp: 9 tablet, Rfl: 5   triamcinolone cream (KENALOG) 0.1 %, Apply 1 application topically 2 (two)  times daily as needed (rash)., Disp: 80 g, Rfl: 11   venlafaxine (EFFEXOR) 75 MG tablet, Take 1 tablet (75 mg total) by mouth daily., Disp: 90 tablet, Rfl: 3 No current facility-administered medications for this visit.  Facility-Administered Medications Ordered in Other Visits:    0.9 %  sodium chloride infusion, , Intravenous, Continuous, Michelle Massed, MD, Stopped at 10/23/20 1547   sodium chloride flush (NS) 0.9 % injection 10 mL, 10 mL, Intravenous, PRN, Michelle Massed, MD, 10 mL at 10/23/20 1544   Allergies: Allergies  Allergen Reactions   Morphine And Codeine Itching   Nickel Itching   Nortriptyline Other (See Comments)    Significant weight gain   Topamax [Topiramate] Diarrhea and Nausea Only   Xanax [Alprazolam]     Can't wake up    Actifed Cold-Allergy [Chlorpheniramine-Phenyleph Er] Rash    Red dye only   Amoxicillin Rash    Did it involve swelling of the face/tongue/throat, SOB, or low BP? Unknown Did it involve sudden or severe rash/hives, skin peeling, or any reaction on  the inside of your mouth or nose? Unknown Did you need to seek medical attention at a hospital or doctor's office? Unknown When did it last happen? teenager       If all above answers are "NO", may proceed with cephalosporin use.    Codeine Hives   Erythromycin Rash   Penicillins Rash    Did it involve swelling of the face/tongue/throat, SOB, or low BP? Unknown Did it involve sudden or severe rash/hives, skin peeling, or any reaction on the inside of your mouth or nose? Unknown Did you need to seek medical attention at a hospital or doctor's office? Unknown When did it last happen? teenager       If all above answers are "NO", may proceed with cephalosporin use.    Sudafed [Pseudoephedrine Hcl] Rash    Red dye only    REVIEW OF SYSTEMS:   Review of Systems  Constitutional:  Negative for chills, fatigue and fever.  HENT:   Negative for lump/mass, mouth sores, nosebleeds, sore  throat and trouble swallowing.   Eyes:  Negative for eye problems.  Respiratory:  Negative for cough and shortness of breath.   Cardiovascular:  Negative for chest pain, leg swelling and palpitations.  Gastrointestinal:  Negative for abdominal pain, constipation, diarrhea, nausea and vomiting.  Genitourinary:  Negative for bladder incontinence, difficulty urinating, dysuria, frequency, hematuria and nocturia.   Musculoskeletal:  Negative for arthralgias, back pain, flank pain, myalgias and neck pain.  Skin:  Negative for itching and rash.  Neurological:  Negative for dizziness, headaches and numbness.  Hematological:  Does not bruise/bleed easily.  Psychiatric/Behavioral:  Negative for depression, sleep disturbance and suicidal ideas. The patient is not nervous/anxious.   All other systems reviewed and are negative.    VITALS:   There were no vitals taken for this visit.  Wt Readings from Last 3 Encounters:  03/29/22 189 lb (85.7 kg)  02/20/22 190 lb 3.2 oz (86.3 kg)  02/11/22 191 lb 6.4 oz (86.8 kg)    There is no height or weight on file to calculate BMI.  Performance status (ECOG): 1 - Symptomatic but completely ambulatory  PHYSICAL EXAM:   Physical Exam Vitals and nursing note reviewed. Exam conducted with a chaperone present.  Constitutional:      Appearance: Normal appearance.  Cardiovascular:     Rate and Rhythm: Normal rate and regular rhythm.     Pulses: Normal pulses.     Heart sounds: Normal heart sounds.  Pulmonary:     Effort: Pulmonary effort is normal.     Breath sounds: Normal breath sounds.  Abdominal:     Palpations: Abdomen is soft. There is no hepatomegaly, splenomegaly or mass.     Tenderness: There is no abdominal tenderness.  Musculoskeletal:     Right lower leg: No edema.     Left lower leg: No edema.  Lymphadenopathy:     Cervical: No cervical adenopathy.     Right cervical: No superficial, deep or posterior cervical adenopathy.    Left  cervical: No superficial, deep or posterior cervical adenopathy.     Upper Body:     Right upper body: No supraclavicular or axillary adenopathy.     Left upper body: No supraclavicular or axillary adenopathy.  Neurological:     General: No focal deficit present.     Mental Status: She is alert and oriented to person, place, and time.  Psychiatric:        Mood and Affect: Mood normal.  Behavior: Behavior normal.     LABS:      Latest Ref Rng & Units 08/05/2022    3:22 PM 05/27/2022    2:42 PM 03/27/2022   10:04 AM  CBC  WBC 4.0 - 10.5 K/uL 7.2  6.4  9.6   Hemoglobin 12.0 - 15.0 g/dL 84.1  32.4  40.1   Hematocrit 36.0 - 46.0 % 39.5  37.5  36.2   Platelets 150 - 400 K/uL 255  290  334       Latest Ref Rng & Units 08/05/2022    3:22 PM 05/27/2022    2:42 PM 03/27/2022   10:04 AM  CMP  Glucose 70 - 99 mg/dL 027  253  664   BUN 6 - 20 mg/dL 12  9  13    Creatinine 0.44 - 1.00 mg/dL 4.03  4.74  2.59   Sodium 135 - 145 mmol/L 131  137  134   Potassium 3.5 - 5.1 mmol/L 3.7  3.5  3.9   Chloride 98 - 111 mmol/L 97  101  99   CO2 22 - 32 mmol/L 25  26  25    Calcium 8.9 - 10.3 mg/dL 8.8  9.1  8.8   Total Protein 6.5 - 8.1 g/dL 6.8  7.2  6.8   Total Bilirubin 0.3 - 1.2 mg/dL 0.5  0.3  0.5   Alkaline Phos 38 - 126 U/L 99  84  76   AST 15 - 41 U/L 28  22  23    ALT 0 - 44 U/L 20  21  19       No results found for: "CEA1", "CEA" / No results found for: "CEA1", "CEA" No results found for: "PSA1" No results found for: "CAN199" Lab Results  Component Value Date   CAN125 9.7 08/05/2022    No results found for: "TOTALPROTELP", "ALBUMINELP", "A1GS", "A2GS", "BETS", "BETA2SER", "GAMS", "MSPIKE", "SPEI" Lab Results  Component Value Date   TIBC 329 08/05/2022   TIBC 346 05/27/2022   TIBC 351 03/27/2022   FERRITIN 219 08/05/2022   FERRITIN 67 05/27/2022   FERRITIN 114 03/27/2022   IRONPCTSAT 29 08/05/2022   IRONPCTSAT 14 05/27/2022   IRONPCTSAT 24 03/27/2022   Lab Results   Component Value Date   LDH 277 (H) 07/18/2018   LDH 193 (H) 07/07/2018     STUDIES:   No results found.

## 2022-08-12 ENCOUNTER — Encounter: Payer: Self-pay | Admitting: Hematology

## 2022-08-12 ENCOUNTER — Inpatient Hospital Stay (HOSPITAL_BASED_OUTPATIENT_CLINIC_OR_DEPARTMENT_OTHER): Payer: 59 | Admitting: Hematology

## 2022-08-12 VITALS — BP 128/82 | HR 94 | Temp 99.0°F | Resp 18 | Wt 180.0 lb

## 2022-08-12 DIAGNOSIS — M545 Low back pain, unspecified: Secondary | ICD-10-CM | POA: Diagnosis not present

## 2022-08-12 DIAGNOSIS — D649 Anemia, unspecified: Secondary | ICD-10-CM | POA: Diagnosis not present

## 2022-08-12 DIAGNOSIS — Z9221 Personal history of antineoplastic chemotherapy: Secondary | ICD-10-CM | POA: Diagnosis not present

## 2022-08-12 DIAGNOSIS — R1012 Left upper quadrant pain: Secondary | ICD-10-CM | POA: Diagnosis not present

## 2022-08-12 DIAGNOSIS — D509 Iron deficiency anemia, unspecified: Secondary | ICD-10-CM | POA: Diagnosis not present

## 2022-08-12 DIAGNOSIS — C563 Malignant neoplasm of bilateral ovaries: Secondary | ICD-10-CM

## 2022-08-12 DIAGNOSIS — R200 Anesthesia of skin: Secondary | ICD-10-CM | POA: Diagnosis not present

## 2022-08-12 DIAGNOSIS — Z87891 Personal history of nicotine dependence: Secondary | ICD-10-CM | POA: Diagnosis not present

## 2022-08-12 DIAGNOSIS — Z803 Family history of malignant neoplasm of breast: Secondary | ICD-10-CM | POA: Diagnosis not present

## 2022-08-12 DIAGNOSIS — C786 Secondary malignant neoplasm of retroperitoneum and peritoneum: Secondary | ICD-10-CM | POA: Diagnosis not present

## 2022-08-12 DIAGNOSIS — K5900 Constipation, unspecified: Secondary | ICD-10-CM | POA: Diagnosis not present

## 2022-08-12 NOTE — Patient Instructions (Addendum)
[facility] at [facility] Discharge Instructions   You were seen and examined today by Dr. Ellin Saba.  He reviewed the results of your lab work which are normal/stable.  He reviewed the results of your scan which is normal/stable.    Continue Lynparza as prescribed.   We will see you back in 3 months. We will repeat your lab work prior to your next visit.   Return as scheduled.      Thank you for choosing [facility] at [facility] to provide your oncology and hematology care.  To afford each patient quality time with our provider, please arrive at least 15 minutes before your scheduled appointment time.   If you have a lab appointment with the Cancer Center please come in thru the Main Entrance and check in at the main information desk.  You need to re-schedule your appointment should you arrive 10 or more minutes late.  We strive to give you quality time with our providers, and arriving late affects you and other patients whose appointments are after yours.  Also, if you no show three or more times for appointments you may be dismissed from the clinic at the providers discretion.     Again, thank you for choosing [facility].  Our hope is that these requests will decrease the amount of time that you wait before being seen by our physicians.       _____________________________________________________________  Should you have questions after your visit to [facility], please contact our office at [phone] and follow the prompts.  Our office hours are 8:00 a.m. and 4:30 p.m. Monday - Friday.  Please note that voicemails left after 4:00 p.m. may not be returned until the following business day.  We are closed weekends and major holidays.  You do have access to a nurse 24-7, just call the main number to the clinic [phone] and do not press any options, hold on the line and a nurse will answer the phone.    For  prescription refill requests, have your pharmacy contact our office and allow 72 hours.    Due to Covid, you will need to wear a mask upon entering the hospital. If you do not have a mask, a mask will be given to you at the Main Entrance upon arrival. For doctor visits, patients may have 1 support person age 73 or older with them. For treatment visits, patients can not have anyone with them due to social distancing guidelines and our immunocompromised population.

## 2022-08-12 NOTE — Progress Notes (Signed)
Patient is taking Lynparza as prescribed.  She has not missed any doses and reports no side effects at this time.   

## 2022-08-20 ENCOUNTER — Other Ambulatory Visit: Payer: Self-pay | Admitting: *Deleted

## 2022-08-20 DIAGNOSIS — R110 Nausea: Secondary | ICD-10-CM

## 2022-08-20 MED ORDER — ONDANSETRON HCL 4 MG PO TABS: 4.0000 mg | ORAL_TABLET | Freq: Three times a day (TID) | ORAL | 3 refills | Status: DC | PRN

## 2022-08-20 MED ORDER — OXYCODONE HCL 20 MG PO TABS: 1.0000 | ORAL_TABLET | ORAL | 0 refills | Status: DC | PRN

## 2022-08-20 MED ORDER — PROCHLORPERAZINE MALEATE 10 MG PO TABS
10.0000 mg | ORAL_TABLET | Freq: Two times a day (BID) | ORAL | 3 refills | Status: DC | PRN
Start: 2022-08-20 — End: 2022-12-15

## 2022-08-20 MED ORDER — PROMETHAZINE HCL 25 MG PO TABS
25.0000 mg | ORAL_TABLET | Freq: Four times a day (QID) | ORAL | 4 refills | Status: DC | PRN
Start: 2022-08-20 — End: 2022-12-15

## 2022-08-21 ENCOUNTER — Other Ambulatory Visit (HOSPITAL_COMMUNITY): Payer: Self-pay

## 2022-08-22 DIAGNOSIS — Z9221 Personal history of antineoplastic chemotherapy: Secondary | ICD-10-CM | POA: Diagnosis not present

## 2022-08-22 DIAGNOSIS — E871 Hypo-osmolality and hyponatremia: Secondary | ICD-10-CM | POA: Diagnosis not present

## 2022-08-22 DIAGNOSIS — K449 Diaphragmatic hernia without obstruction or gangrene: Secondary | ICD-10-CM | POA: Diagnosis not present

## 2022-08-22 DIAGNOSIS — Z9181 History of falling: Secondary | ICD-10-CM | POA: Diagnosis not present

## 2022-08-22 DIAGNOSIS — Z881 Allergy status to other antibiotic agents status: Secondary | ICD-10-CM | POA: Diagnosis not present

## 2022-08-22 DIAGNOSIS — C786 Secondary malignant neoplasm of retroperitoneum and peritoneum: Secondary | ICD-10-CM | POA: Diagnosis not present

## 2022-08-22 DIAGNOSIS — Z880 Allergy status to penicillin: Secondary | ICD-10-CM | POA: Diagnosis not present

## 2022-08-22 DIAGNOSIS — Z885 Allergy status to narcotic agent status: Secondary | ICD-10-CM | POA: Diagnosis not present

## 2022-08-22 DIAGNOSIS — Z79899 Other long term (current) drug therapy: Secondary | ICD-10-CM | POA: Diagnosis not present

## 2022-08-22 DIAGNOSIS — D509 Iron deficiency anemia, unspecified: Secondary | ICD-10-CM | POA: Diagnosis not present

## 2022-08-22 DIAGNOSIS — Z8781 Personal history of (healed) traumatic fracture: Secondary | ICD-10-CM | POA: Diagnosis not present

## 2022-08-22 DIAGNOSIS — Z91048 Other nonmedicinal substance allergy status: Secondary | ICD-10-CM | POA: Diagnosis not present

## 2022-08-22 DIAGNOSIS — K219 Gastro-esophageal reflux disease without esophagitis: Secondary | ICD-10-CM | POA: Diagnosis not present

## 2022-08-22 DIAGNOSIS — Z87891 Personal history of nicotine dependence: Secondary | ICD-10-CM | POA: Diagnosis not present

## 2022-08-22 DIAGNOSIS — Z9049 Acquired absence of other specified parts of digestive tract: Secondary | ICD-10-CM | POA: Diagnosis not present

## 2022-08-22 DIAGNOSIS — E861 Hypovolemia: Secondary | ICD-10-CM | POA: Diagnosis not present

## 2022-08-27 ENCOUNTER — Other Ambulatory Visit: Payer: Self-pay

## 2022-08-28 DIAGNOSIS — R5383 Other fatigue: Secondary | ICD-10-CM | POA: Diagnosis not present

## 2022-08-28 DIAGNOSIS — Z931 Gastrostomy status: Secondary | ICD-10-CM | POA: Diagnosis not present

## 2022-08-28 DIAGNOSIS — R52 Pain, unspecified: Secondary | ICD-10-CM | POA: Diagnosis not present

## 2022-08-28 DIAGNOSIS — Z09 Encounter for follow-up examination after completed treatment for conditions other than malignant neoplasm: Secondary | ICD-10-CM | POA: Diagnosis not present

## 2022-08-30 ENCOUNTER — Other Ambulatory Visit: Payer: Self-pay | Admitting: Oncology

## 2022-08-30 ENCOUNTER — Other Ambulatory Visit: Payer: Self-pay

## 2022-08-30 MED ORDER — GABAPENTIN 100 MG PO CAPS: 300.0000 mg | ORAL_CAPSULE | Freq: Once | ORAL | 0 refills | Status: DC

## 2022-09-02 ENCOUNTER — Other Ambulatory Visit: Payer: Self-pay

## 2022-09-02 MED ORDER — GABAPENTIN 100 MG PO CAPS: 300.0000 mg | ORAL_CAPSULE | Freq: Every day | ORAL | 3 refills | Status: DC

## 2022-09-05 ENCOUNTER — Other Ambulatory Visit: Payer: Self-pay

## 2022-09-05 MED ORDER — DIAZEPAM 5 MG PO TABS: 5.0000 mg | ORAL_TABLET | Freq: Every day | ORAL | 5 refills | Status: DC

## 2022-09-19 ENCOUNTER — Other Ambulatory Visit: Payer: Self-pay | Admitting: *Deleted

## 2022-09-19 MED ORDER — OXYCODONE HCL 20 MG PO TABS: 1.0000 | ORAL_TABLET | ORAL | 0 refills | Status: DC | PRN

## 2022-09-20 ENCOUNTER — Other Ambulatory Visit (HOSPITAL_COMMUNITY): Payer: Self-pay

## 2022-10-03 DIAGNOSIS — Z0000 Encounter for general adult medical examination without abnormal findings: Secondary | ICD-10-CM | POA: Diagnosis not present

## 2022-10-03 DIAGNOSIS — E7800 Pure hypercholesterolemia, unspecified: Secondary | ICD-10-CM | POA: Diagnosis not present

## 2022-10-03 DIAGNOSIS — R5383 Other fatigue: Secondary | ICD-10-CM | POA: Diagnosis not present

## 2022-10-03 DIAGNOSIS — Z79899 Other long term (current) drug therapy: Secondary | ICD-10-CM | POA: Diagnosis not present

## 2022-10-03 DIAGNOSIS — Z87891 Personal history of nicotine dependence: Secondary | ICD-10-CM | POA: Diagnosis not present

## 2022-10-03 DIAGNOSIS — Z7189 Other specified counseling: Secondary | ICD-10-CM | POA: Diagnosis not present

## 2022-10-03 DIAGNOSIS — Z299 Encounter for prophylactic measures, unspecified: Secondary | ICD-10-CM | POA: Diagnosis not present

## 2022-10-03 DIAGNOSIS — I10 Essential (primary) hypertension: Secondary | ICD-10-CM | POA: Diagnosis not present

## 2022-10-09 ENCOUNTER — Encounter (HOSPITAL_COMMUNITY): Payer: Self-pay | Admitting: Hematology

## 2022-10-18 ENCOUNTER — Other Ambulatory Visit: Payer: Self-pay

## 2022-10-18 MED ORDER — OXYCODONE HCL 20 MG PO TABS: 1.0000 | ORAL_TABLET | ORAL | 0 refills | Status: DC | PRN

## 2022-10-21 DIAGNOSIS — Z299 Encounter for prophylactic measures, unspecified: Secondary | ICD-10-CM | POA: Diagnosis not present

## 2022-10-21 DIAGNOSIS — Z2821 Immunization not carried out because of patient refusal: Secondary | ICD-10-CM | POA: Diagnosis not present

## 2022-10-21 DIAGNOSIS — R0683 Snoring: Secondary | ICD-10-CM | POA: Diagnosis not present

## 2022-10-23 ENCOUNTER — Other Ambulatory Visit: Payer: Self-pay

## 2022-10-25 ENCOUNTER — Other Ambulatory Visit: Payer: Self-pay

## 2022-10-28 ENCOUNTER — Other Ambulatory Visit (HOSPITAL_COMMUNITY): Payer: Self-pay

## 2022-10-28 ENCOUNTER — Other Ambulatory Visit: Payer: Self-pay

## 2022-10-28 ENCOUNTER — Other Ambulatory Visit: Payer: Self-pay | Admitting: *Deleted

## 2022-10-28 ENCOUNTER — Other Ambulatory Visit: Payer: Self-pay | Admitting: Hematology

## 2022-10-28 MED ORDER — OLAPARIB 150 MG PO TABS
300.0000 mg | ORAL_TABLET | Freq: Two times a day (BID) | ORAL | 2 refills | Status: DC
  Filled 2022-10-28: qty 120, 30d supply, fill #0
  Filled 2022-11-19: qty 120, 30d supply, fill #1

## 2022-10-28 NOTE — Progress Notes (Signed)
Specialty Pharmacy Refill Coordination Note  Michelle Holt is a 52 y.o. female contacted today regarding refills of specialty medication(s) Olaparib   Patient requested Delivery   Delivery date: 11/04/22   Verified address: [name]   [place] [address]   Medication will be filled on 11/01/22 pending refill request.

## 2022-10-28 NOTE — Telephone Encounter (Signed)
Lynparza prescription approved.  Patient is tolerating and is to continue therapy.

## 2022-10-28 NOTE — Progress Notes (Signed)
Specialty Pharmacy Ongoing Clinical Assessment Note  Michelle Holt is a 52 y.o. female who is being followed by the specialty pharmacy service for RxSp Oncology   Patient's specialty medication(s) reviewed today: Olaparib   Missed doses in the last 4 weeks: 0   Patient/Caregiver did not have any additional questions or concerns.   Therapeutic benefit summary: Patient is achieving benefit   Adverse events/side effects summary: No adverse events/side effects   Patient's therapy is appropriate to: Continue    Goals Addressed             This Visit's Progress    Slow Disease Progression       Patient is on track. Patient will maintain adherence. Per provider note from 08/12/22 Labs are normal and CA125 is 9.7 and stable.          Follow up:  6 months  Otto Herb Specialty Pharmacist

## 2022-11-12 ENCOUNTER — Inpatient Hospital Stay: Payer: 59 | Attending: Hematology

## 2022-11-12 DIAGNOSIS — C786 Secondary malignant neoplasm of retroperitoneum and peritoneum: Secondary | ICD-10-CM | POA: Diagnosis not present

## 2022-11-12 DIAGNOSIS — C563 Malignant neoplasm of bilateral ovaries: Secondary | ICD-10-CM | POA: Diagnosis present

## 2022-11-12 DIAGNOSIS — D509 Iron deficiency anemia, unspecified: Secondary | ICD-10-CM | POA: Diagnosis not present

## 2022-11-12 DIAGNOSIS — D649 Anemia, unspecified: Secondary | ICD-10-CM

## 2022-11-12 LAB — IRON AND TIBC
Iron: 49 ug/dL (ref 28–170)
Saturation Ratios: 17 % (ref 10.4–31.8)
TIBC: 288 ug/dL (ref 250–450)
UIBC: 239 ug/dL

## 2022-11-12 LAB — CBC WITH DIFFERENTIAL/PLATELET
Abs Immature Granulocytes: 0.05 10*3/uL (ref 0.00–0.07)
Basophils Absolute: 0.1 10*3/uL (ref 0.0–0.1)
Basophils Relative: 1 %
Eosinophils Absolute: 0.3 10*3/uL (ref 0.0–0.5)
Eosinophils Relative: 3 %
HCT: 38.6 % (ref 36.0–46.0)
Hemoglobin: 12.8 g/dL (ref 12.0–15.0)
Immature Granulocytes: 1 %
Lymphocytes Relative: 19 %
Lymphs Abs: 1.8 10*3/uL (ref 0.7–4.0)
MCH: 39.6 pg — ABNORMAL HIGH (ref 26.0–34.0)
MCHC: 33.2 g/dL (ref 30.0–36.0)
MCV: 119.5 fL — ABNORMAL HIGH (ref 80.0–100.0)
Monocytes Absolute: 0.7 10*3/uL (ref 0.1–1.0)
Monocytes Relative: 8 %
Neutro Abs: 6.6 10*3/uL (ref 1.7–7.7)
Neutrophils Relative %: 68 %
Platelets: 407 10*3/uL — ABNORMAL HIGH (ref 150–400)
RBC: 3.23 MIL/uL — ABNORMAL LOW (ref 3.87–5.11)
RDW: 14.5 % (ref 11.5–15.5)
WBC: 9.5 10*3/uL (ref 4.0–10.5)
nRBC: 0.2 % (ref 0.0–0.2)

## 2022-11-12 LAB — COMPREHENSIVE METABOLIC PANEL
ALT: 24 U/L (ref 0–44)
AST: 27 U/L (ref 15–41)
Albumin: 3.3 g/dL — ABNORMAL LOW (ref 3.5–5.0)
Alkaline Phosphatase: 197 U/L — ABNORMAL HIGH (ref 38–126)
Anion gap: 10 (ref 5–15)
BUN: 13 mg/dL (ref 6–20)
CO2: 25 mmol/L (ref 22–32)
Calcium: 8.7 mg/dL — ABNORMAL LOW (ref 8.9–10.3)
Chloride: 99 mmol/L (ref 98–111)
Creatinine, Ser: 0.66 mg/dL (ref 0.44–1.00)
GFR, Estimated: >60 mL/min (ref >60)
Glucose, Bld: 110 mg/dL — ABNORMAL HIGH (ref 70–99)
Potassium: 4.1 mmol/L (ref 3.5–5.1)
Sodium: 134 mmol/L — ABNORMAL LOW (ref 135–145)
Total Bilirubin: 0.3 mg/dL (ref 0.3–1.2)
Total Protein: 6.7 g/dL (ref 6.5–8.1)

## 2022-11-12 LAB — MAGNESIUM: Magnesium: 2 mg/dL (ref 1.7–2.4)

## 2022-11-12 LAB — FERRITIN: Ferritin: 206 ng/mL (ref 11–307)

## 2022-11-12 MED ORDER — HEPARIN SOD (PORK) LOCK FLUSH 100 UNIT/ML IV SOLN
500.0000 [IU] | Freq: Once | INTRAVENOUS | Status: AC
  Administered 2022-11-12: 500 [IU] via INTRAVENOUS

## 2022-11-12 MED ORDER — SODIUM CHLORIDE 0.9% FLUSH
10.0000 mL | INTRAVENOUS | Status: AC
  Administered 2022-11-12: 10 mL

## 2022-11-12 NOTE — Progress Notes (Signed)
Michelle Holt presented for Portacath access and flush with lab work.  Portacath located right chest wall accessed with  H 20 needle.  Good blood return present. Portacath flushed with 20ml NS and 500U/58ml Heparin and needle removed intact.  Procedure tolerated well and without incident. Vital signs stable. No complaints at this time. Discharged from clinic ambulatory in stable condition. Alert and oriented x 3. F/U with [facility] as scheduled.

## 2022-11-12 NOTE — Patient Instructions (Signed)
[facility]  Discharge Instructions: Thank you for choosing [facility] to provide your oncology and hematology care.  If you have a lab appointment with the Cancer Center - please note that after April 8th, 2024, all labs will be drawn in the cancer center.  You do not have to check in or register with the main entrance as you have in the past but will complete your check-in in the cancer center.  Wear comfortable clothing and clothing appropriate for easy access to any Portacath or PICC line.   We strive to give you quality time with your provider. You may need to reschedule your appointment if you arrive late (15 or more minutes).  Arriving late affects you and other patients whose appointments are after yours.  Also, if you miss three or more appointments without notifying the office, you may be dismissed from the clinic at the provider's discretion.      For prescription refill requests, have your pharmacy contact our office and allow 72 hours for refills to be completed.    Today you received the following: Port flush with lab work.       To help prevent nausea and vomiting after your treatment, we encourage you to take your nausea medication as directed.  BELOW ARE SYMPTOMS THAT SHOULD BE REPORTED IMMEDIATELY: *FEVER GREATER THAN 100.4 F (38 C) OR HIGHER *CHILLS OR SWEATING *NAUSEA AND VOMITING THAT IS NOT CONTROLLED WITH YOUR NAUSEA MEDICATION *UNUSUAL SHORTNESS OF BREATH *UNUSUAL BRUISING OR BLEEDING *URINARY PROBLEMS (pain or burning when urinating, or frequent urination) *BOWEL PROBLEMS (unusual diarrhea, constipation, pain near the anus) TENDERNESS IN MOUTH AND THROAT WITH OR WITHOUT PRESENCE OF ULCERS (sore throat, sores in mouth, or a toothache) UNUSUAL RASH, SWELLING OR PAIN  UNUSUAL VAGINAL DISCHARGE OR ITCHING   Items with * indicate a potential emergency and should be followed up as soon as possible or go to the Emergency Department if  any problems should occur.  Please show the CHEMOTHERAPY ALERT CARD or IMMUNOTHERAPY ALERT CARD at check-in to the Emergency Department and triage nurse.  Should you have questions after your visit or need to cancel or reschedule your appointment, please contact [facility] AT [facility] [phone]  and follow the prompts.  Office hours are 8:00 a.m. to 4:30 p.m. Monday - Friday. Please note that voicemails left after 4:00 p.m. may not be returned until the following business day.  We are closed weekends and major holidays. You have access to a nurse at all times for urgent questions. Please call the main number to the clinic [phone] and follow the prompts.  For any non-urgent questions, you may also contact your provider using MyChart. We now offer e-Visits for anyone 58 and older to request care online for non-urgent symptoms. For details visit [email or web address].   Also download the MyChart app! Go to the app store, search "MyChart", open the app, select Branch, and log in with your MyChart username and password.

## 2022-11-13 DIAGNOSIS — G4730 Sleep apnea, unspecified: Secondary | ICD-10-CM | POA: Diagnosis not present

## 2022-11-13 LAB — CA 125: Cancer Antigen (CA) 125: 151 U/mL — ABNORMAL HIGH (ref 0.0–38.1)

## 2022-11-15 ENCOUNTER — Encounter (HOSPITAL_COMMUNITY): Payer: Self-pay | Admitting: Hematology

## 2022-11-15 MED ORDER — OXYCODONE HCL 20 MG PO TABS: 1.0000 | ORAL_TABLET | ORAL | 0 refills | Status: DC | PRN

## 2022-11-19 ENCOUNTER — Encounter (HOSPITAL_COMMUNITY): Payer: Self-pay | Admitting: Hematology

## 2022-11-19 ENCOUNTER — Other Ambulatory Visit: Payer: Self-pay

## 2022-11-19 ENCOUNTER — Inpatient Hospital Stay: Payer: 59 | Attending: Hematology | Admitting: Hematology

## 2022-11-19 VITALS — BP 122/72 | HR 86 | Temp 98.2°F | Resp 18 | Wt 188.8 lb

## 2022-11-19 DIAGNOSIS — D508 Other iron deficiency anemias: Secondary | ICD-10-CM

## 2022-11-19 DIAGNOSIS — Z7901 Long term (current) use of anticoagulants: Secondary | ICD-10-CM | POA: Insufficient documentation

## 2022-11-19 DIAGNOSIS — E611 Iron deficiency: Secondary | ICD-10-CM | POA: Insufficient documentation

## 2022-11-19 DIAGNOSIS — C786 Secondary malignant neoplasm of retroperitoneum and peritoneum: Secondary | ICD-10-CM | POA: Diagnosis not present

## 2022-11-19 DIAGNOSIS — C563 Malignant neoplasm of bilateral ovaries: Secondary | ICD-10-CM | POA: Insufficient documentation

## 2022-11-19 DIAGNOSIS — R1012 Left upper quadrant pain: Secondary | ICD-10-CM | POA: Diagnosis not present

## 2022-11-19 DIAGNOSIS — Z87891 Personal history of nicotine dependence: Secondary | ICD-10-CM | POA: Insufficient documentation

## 2022-11-19 DIAGNOSIS — Z79899 Other long term (current) drug therapy: Secondary | ICD-10-CM | POA: Insufficient documentation

## 2022-11-19 NOTE — Progress Notes (Signed)
Patient is taking Lynparza as prescribed.  She has not missed any doses and reports no side effects at this time.   

## 2022-11-19 NOTE — Progress Notes (Signed)
[facility] [address] [place] [address]    Clinic Day:  11/19/22   Referring physician: Ignatius Specking, MD  Patient Care Team: Ignatius Specking, MD as PCP - General (Internal Medicine) Doreatha Massed, MD as Medical Oncologist (Oncology)   ASSESSMENT & PLAN:   Assessment: 1.  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 -Robotic assisted laparoscopic TAH and BSO and omentectomy on 12/24/2018, pathology showing high-grade serous carcinoma, PT3P NX. -Germline mutation testing was negative. -3 more cycles of adjuvant chemotherapy completed on 03/15/2019. -CTAP on 04/13/2019 showed no findings of active malignancy.  28% reduction in the volume of presumed chronic hematoma/chronic fluid collection splaying the upper margin of the spleen.  Large type III hiatal hernia. -CTAP on 10/13/2019 shows no findings of recurrence or metastatic disease. - Foundation 1 shows HRD+, LOH score>16%.  MSI-stable.  MYC amplification.  T p53 mutation. - We reviewed CT CAP from 02/21/2021 which showed progressive peritoneal metastasis compared to 12/06/2020 scan.  No bowel obstruction.  Small right pleural effusion with mild enlargement of an isolated left external iliac lymph node. - Reviewed EGD from 02/28/2021 which showed hiatal hernia and normal findings. - She reported epigastric and left upper quadrant pain worse in the last 1 week which is related to progression of her malignancy. - 6 cycles of carboplatin and paclitaxel from 03/13/2021 through 06/18/2021 - CT CAP (07/12/2021): No evidence of recurrence or metastatic disease. - Olaparib 300 mg twice daily started on 08/22/2021.    Plan: 1.  Clinical stage IVa high-grade serous ovarian cancer, positive cytology of left pleural effusion, HRD positive: - CTAP on 08/05/2022: No new or progressive findings.  Large  hiatal hernia. - She underwent hiatal hernia surgery on August 22, 2022 at [facility]. - She is having difficulty swallowing solid food since then. - She also reported left shoulder pain which started in the middle of August, predominantly at nighttime.  She uses Salonpas which helps. - Reviewed labs today: Normal LFTs and creatinine.  CBC grossly normal.  However CA125 Inc. is to 151 from 9.7. - Recommend restaging CT CAP with contrast.   2.  Lower back/left upper quadrant pain: - Continue oxycodone 20 mg every 4-6 hours as needed.   3.  Numbness in the feet/cramping in the hands: - Continue gabapentin 200 mg at bedtime.  4.  Iron deficiency state: - Last Feraheme on 06/24/2022.  Ferritin is 206 and hemoglobin normal at 12.8.  No indication for parenteral iron therapy.  5.  Constipation: - Continue stool softener twice daily.  Well-controlled.  6.  Hypomagnesemia: - Continue magnesium twice daily.  Magnesium is 2.0.    Orders Placed This Encounter  Procedures   CT CHEST ABDOMEN PELVIS W CONTRAST    Standing Status:   Future    Standing Expiration Date:   11/18/2023    Order Specific Question:   If indicated for the ordered procedure, I authorize the administration of contrast media per Radiology protocol    Answer:   Yes    Order Specific Question:   Does the patient have a contrast media/X-ray dye allergy?    Answer:   No    Order Specific Question:   Preferred imaging location?    Answer:   [facility]    Order Specific Question:   Release to patient    Answer:   Immediate  Order Specific Question:   If indicated for the ordered procedure, I authorize the administration of oral contrast media per Radiology protocol    Answer:   Yes   CBC with Differential    Standing Status:   Standing    Number of Occurrences:   10    Standing Expiration Date:   11/19/2023   Comprehensive metabolic panel    Standing Status:   Standing    Number of Occurrences:   10     Standing Expiration Date:   11/19/2023   CA 125    Standing Status:   Standing    Number of Occurrences:   10    Standing Expiration Date:   11/19/2023   Iron and TIBC ([facility] ONLY)    Standing Status:   Standing    Number of Occurrences:   10    Standing Expiration Date:   11/19/2023   Ferritin    Standing Status:   Standing    Number of Occurrences:   10    Standing Expiration Date:   11/19/2023      Michelle Holt,acting as a scribe for Doreatha Massed, MD.,have documented all relevant documentation on the behalf of Doreatha Massed, MD,as directed by  Doreatha Massed, MD while in the presence of Doreatha Massed, MD.  I, Doreatha Massed MD, have reviewed the above documentation for accuracy and completeness, and I agree with the above.     Doreatha Massed, MD   11/5/20245:53 PM  CHIEF COMPLAINT:   Diagnosis: high-grade serous ovarian cancer    Cancer Staging  No matching staging information was found for the patient.    Prior Therapy: 1. Carboplatin and paclitaxel x 7 cycles from 08/24/2018 to 03/15/2019. 2. Laparoscopic TAH & BSO & omenectomy on 12/24/2018. 3.  6 cycles of carboplatin and paclitaxel from 03/05/2021 to 06/18/2021  Current Therapy:  olaparib    HISTORY OF PRESENT ILLNESS:   Oncology History  Ovarian cancer, bilateral (HCC)  07/07/2018 Pathology Results   PLEURAL FLUID, LEFT (SPECIMEN 1 OF 1, COLLECTED 07/07/18): - MALIGNANT CELLS CONSISTENT WITH ADENOCARCINOMA - SEE COMMENT  Source Pleural Fluid, (Specimen 1 of 1, collected on 07/07/2018) Gross Specimen: Received is/are 1000cc of bloody red fluid with tissue. (TC:tc) Prepared: # Smears: 0 # Concentration Technique Slides (i.e. ThinPrep): 1 # Cell Block: 1 Conventional Additional Studies: Two Hematology slides labeled [id number] Comment The malignant cells are positive for cytokeratin 7, p53, WT-1, Pax-8, Moc31, ER (weak) and EMA but negative for  cytokeratin 20, TTF-1, GATA-3, CDX-2 and D2-40. Overall, the phenotype is consistent with a gynecologic primary; clinical correlation recommended.   07/07/2018 Procedure   Successful ultrasound guided left thoracentesis yielding 2.0 L of pleural fluid   07/08/2018 Procedure   1. Technically successful placement of left 14 French pigtail chest drain, placed to Pleur-evac water-seal.   07/08/2018 Procedure   1. Technically successful five [place] double lumen power injectable PICC placement   07/09/2018 Imaging   Ct chest 1. There is a moderate, loculated left hydropneumothorax with a small air component and moderate fluid component. The largest loculated component is located posteriorly. There is a pigtail drainage catheter about the lateral pleural space. There is no obvious etiology, such as obvious mass or pleural disease.   2. There is a small right pleural effusion with associated atelectasis or consolidation and a subpleural consolidation of the superior segment right lower lobe (series 4, image 56), of uncertain significance, possibly infectious or inflammatory   07/10/2018 Imaging  Ct abdomen and pelvis: 1. The bilateral ovaries are enlarged by heterogeneous appearing cystic lesions, measuring 5.3 x 4.2 cm on the right (series 4, image 72) and 4.5 x 3.2 cm on the left (series 4, image 75). Consider dedicated pelvic ultrasound and/or pelvic MRI to further evaluate for solid components given high suspicion for GYN primary malignancy.   2. No other evidence of mass and no lymphadenopathy in the abdomen or pelvis.   3. Trace ascites. There is some suggestion of omental and peritoneal nodularity (e.g. Series 4, image 43), concerning for peritoneal metastatic disease.    4. Loculated left-sided pleural effusion with left-sided pleural drainage catheter in position. Small right pleural effusion   07/13/2018 Surgery   OPERATION: 1.  Left VATS (video-assisted thoracoscopic surgery) for drainage  of loculated pleural effusion. 2.  Talc pleurodesis for malignant pleural effusion. 3.  Placement of PleurX catheter for management of malignant pleural effusion. 4.  Placement of On-Q analgesia catheter system.    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   07/13/2018 Pathology Results   Pleura, peel, Left Pleural - FIBRO-FIBRINOUS PLEURITIS - NEGATIVE FOR MALIGNANCY   07/18/2018 Initial Diagnosis   Ovarian cancer, bilateral (HCC)   07/20/2018 Procedure   EGD impression: Normal proximal esophagus and mid esophagus. Mild distal esophageal rings; dilation not performed because of esophagitis. LA Grade C reflux esophagitis. Z-line regular, 30 cm from the incisors. 5 cm hiatal hernia. Non-bleeding gastric ulcer with no stigmata of bleeding. Gastritis. Duodenal erosions without bleeding. Normal second portion of the duodenum. No specimens collected.   08/19/2018 Genetic Testing   Negative genetic testing on the common hereditary cancer panel.  The Common Hereditary Gene Panel offered by [facility] includes sequencing and/or deletion duplication testing of the following 48 genes: APC, ATM, AXIN2, BARD1, BMPR1A, BRCA1, BRCA2, BRIP1, CDH1, CDK4, CDKN2A (p14ARF), CDKN2A (p16INK4a), CHEK2, CTNNA1, DICER1, EPCAM (Deletion/duplication testing only), GREM1 (promoter region deletion/duplication testing only), KIT, MEN1, MLH1, MSH2, MSH3, MSH6, MUTYH, NBN, NF1, NHTL1, PALB2, PDGFRA, PMS2, POLD1, POLE, PTEN, RAD50, RAD51C, RAD51D, RNF43, SDHB, SDHC, SDHD, SMAD4, SMARCA4. STK11, TP53, TSC1, TSC2, and VHL.  The following genes were evaluated for sequence changes only: SDHA and HOXB13 c.251G>A variant only. The report date is August 19, 2018.   08/24/2018 - 03/15/2019 Chemotherapy   Patient is on Treatment Plan : OVARIAN  Carboplatin (AUC 6) / Paclitaxel (175) q21d x 6 cycles     03/05/2021 - 06/18/2021 Chemotherapy   Patient is on Treatment Plan : OVARIAN Carboplatin (AUC 6) / Paclitaxel (175) q21d x 6 cycles        INTERVAL HISTORY:   Michelle Holt is a 52 y.o. female presenting to clinic today for follow up of high-grade serous ovarian cancer. She was last seen by me on 08/12/22.  Since her last visit, she underwent surgical repair of paraesophageal hernia including fundoplasty with implantation of mesh on 08/22/22.   Today, she states that she is doing well overall. Her appetite level is at 85%. Her energy level is at 0%.  She c/o aching pain in left shoulder has worsened, particularly at night, beginning in the middle of August 2024. She currently uses lidocaine patches at night. She denies tenderness upon palpation or previous falls/injury to the area. She denies stopping olaparib for surgery. She is taking all medications as prescribed.   She c/o lower back  pain on exertion with associated DOE. She reports occasional nausea and vomiting. She states being easily full. She had 2 episodes of blood in the stools and has a history of hemorrhoids.   PAST MEDICAL HISTORY:   Past Medical History: Past Medical History:  Diagnosis Date   Anemia    Anxiety and depression    Arthritis of facet joints at multiple vertebral levels    L5-S1   Constipation    Dyslipidemia    Family history of breast cancer    Family history of uterine cancer    GERD (gastroesophageal reflux disease)    History of hiatal hernia    History of kidney stones    Insomnia    Irritable bowel syndrome    Migraine    Muscle tension headache    Neuropathy of finger    Ovarian carcinoma (HCC)    ovarian   Plantar fasciitis of right foot    Port-A-Cath in place 08/20/2018    Surgical History: Past Surgical History:  Procedure Laterality Date   ABDOMINAL HYSTERECTOMY     BIOPSY  10/27/2020   Procedure: BIOPSY;  Surgeon: Dolores Frame, MD;  [facility];  Service: Gastroenterology;;   CHOLECYSTECTOMY  2008   COLONOSCOPY N/A 08/13/2013   Procedure: COLONOSCOPY;  Surgeon: Malissa Hippo, MD;  [facility];  Service: Endoscopy;  Laterality: N/A;  230-moved to 145 Ann to notify pt   COLONOSCOPY WITH PROPOFOL N/A 11/15/2020   Procedure: COLONOSCOPY WITH PROPOFOL;  Surgeon: Malissa Hippo, MD;  [facility];  Service: Endoscopy;  Laterality: N/A;  1:40   ESOPHAGEAL DILATION N/A 02/28/2021   Procedure: ESOPHAGEAL DILATION;  Surgeon: Malissa Hippo, MD;  [facility];  Service: Endoscopy;  Laterality: N/A;   ESOPHAGOGASTRODUODENOSCOPY     ESOPHAGOGASTRODUODENOSCOPY (EGD) WITH PROPOFOL N/A 07/20/2018   Procedure: ESOPHAGOGASTRODUODENOSCOPY (EGD) WITH PROPOFOL;  Surgeon: Malissa Hippo, MD;  [facility];  Service: Endoscopy;  Laterality: N/A;  Possible esophageal dilation.   ESOPHAGOGASTRODUODENOSCOPY (EGD) WITH PROPOFOL N/A 10/27/2020   Procedure: ESOPHAGOGASTRODUODENOSCOPY (EGD) WITH PROPOFOL;  Surgeon: Dolores Frame, MD;  [facility];  Service: Gastroenterology;  Laterality: N/A;  2:10, pt knows to arrive at 10:15   ESOPHAGOGASTRODUODENOSCOPY (EGD) WITH PROPOFOL N/A 02/28/2021   Procedure: ESOPHAGOGASTRODUODENOSCOPY (EGD) WITH PROPOFOL;  Surgeon: Malissa Hippo, MD;  [facility];  Service: Endoscopy;  Laterality: N/A;  200 ASA 1   ESOPHAGOGASTRODUODENOSCOPY (EGD) WITH PROPOFOL N/A 03/29/2022   Procedure: ESOPHAGOGASTRODUODENOSCOPY (EGD) WITH PROPOFOL;  Surgeon: Dolores Frame, MD;  [facility];  Service: Gastroenterology;  Laterality: N/A;  2:00 pm, asa 1-2   GIVENS CAPSULE STUDY N/A 11/24/2020   Procedure: GIVENS CAPSULE STUDY;  Surgeon: Malissa Hippo, MD;  [facility];  Service: Endoscopy;  Laterality: N/A;  7:30   IR ANGIOGRAM SELECTIVE EACH ADDITIONAL VESSEL  08/01/2018   IR ANGIOGRAM  VISCERAL SELECTIVE  08/01/2018   IR EMBO ART  VEN HEMORR LYMPH EXTRAV  INC GUIDE ROADMAPPING  08/01/2018   IR IMAGING GUIDED PORT INSERTION  08/20/2018   IR PERC PLEURAL DRAIN W/INDWELL CATH W/IMG GUIDE  07/08/2018   IR THORACENTESIS ASP PLEURAL SPACE W/IMG GUIDE  07/07/2018   IR US GUIDE VASC ACCESS RIGHT  08/01/2018   PLEURAL EFFUSION DRAINAGE Left 07/13/2018   Procedure: DRAINAGE OF LOCULATED PLEURAL EFFUSION;  Surgeon: Kerin Perna, MD;  Location: [facility] OR;  Service: Thoracic;  Laterality:  Left;   POLYPECTOMY  11/15/2020   Procedure: POLYPECTOMY;  Surgeon: Malissa Hippo, MD;  [facility];  Service: Endoscopy;;   REMOVAL OF PLEURAL DRAINAGE CATHETER Left 08/20/2018   Procedure: REMOVAL OF PLEURAL DRAINAGE CATHETER;  Surgeon: Kerin Perna, MD;  Location[facility] OR;  Service: Thoracic;  Laterality: Left;   REMOVAL OF PLEURAL DRAINAGE CATHETER Left 08/20/2018   Procedure: REMOVAL OF PLEURAL DRAINAGE CATHETER;  Surgeon: Kerin Perna, MD;  Location: [facility] OR;  Service: Thoracic;  Laterality: Left;   TALC PLEURODESIS Left 07/13/2018   Procedure: Talc Pleuradesis;  Surgeon: Donata Clay, Theron Arista, MD;  Location: [facility] OR;  Service: Thoracic;  Laterality: Left;   TOTAL HIP ARTHROPLASTY Left 06/05/2020   Procedure: LEFT TOTAL HIP ARTHROPLASTY ANTERIOR APPROACH;  Surgeon: Tarry Kos, MD;  Location: [facility] OR;  Service: Orthopedics;  Laterality: Left;  3-C   TUBAL LIGATION Bilateral    UTERINE ABLATION     VIDEO ASSISTED THORACOSCOPY Left 07/13/2018   Procedure: VIDEO ASSISTED THORACOSCOPY;  Surgeon: Kerin Perna, MD;  Location: [facility] OR;  Service: Thoracic;  Laterality: Left;    Social History: Social History   Socioeconomic History   Marital status: Widowed    Spouse name: Not on file   Number of children: 2   Years of education: 2-College   Highest education level: Not on file  Occupational History    Employer: [employer]  Tobacco Use   Smoking status: Former    Current packs/day: 0.00     Average packs/day: 0.5 packs/day for 17.0 years (8.5 ttl pk-yrs)    Types: Cigarettes    Start date: 06/21/2001    Quit date: 06/22/2018    Years since quitting: 4.4   Smokeless tobacco: Never  Vaping Use   Vaping status: Never Used  Substance and Sexual Activity   Alcohol use: Yes    Alcohol/week: 1.0 standard drink of alcohol    Types: 1 Glasses of wine per week    Comment: occasionally   Drug use: No   Sexual activity: Not Currently  Other Topics Concern   Not on file  Social History Narrative   Patient lives at home with her daughter.    Patient has 2 children.    Patient is widowed.    Patient is right handed.    Patient has her Associates degree.      Social Determinants of Health   Financial Resource Strain: Low Risk  (08/26/2022)   Received from [facility]   Overall Financial Resource Strain (CARDIA)    Difficulty of Paying Living Expenses: Not hard at all  Food Insecurity: No Food Insecurity (08/26/2022)   Received from [facility]   Hunger Vital Sign    Worried About Running Out of Food in the Last Year: Never true    Ran Out of Food in the Last Year: Never true  Transportation Needs: No Transportation Needs (08/26/2022)   Received from [facility] - Transportation    In the past 12 months, has lack of transportation kept you from medical appointments or from getting medications?: No    Lack of Transportation (Non-Medical): No  Physical Activity: Inactive (07/22/2018)   Exercise Vital Sign    Days of Exercise per Week: 0 days    Minutes of Exercise per Session: 0 min  Stress: Stress Concern Present (07/22/2018)   [employer] of Occupational Health - Occupational Stress Questionnaire    Feeling of  Stress : Very much  Social Connections: Moderately Isolated (07/22/2018)   Social Connection and Isolation Panel [NHANES]    Frequency of Communication with Friends and Family: More than three times a week     Frequency of Social Gatherings with Friends and Family: More than three times a week    Attends Religious Services: 1 to 4 times per year    Active Member of [employer] or Organizations: No    Attends Banker Meetings: Never    Marital Status: Widowed  Intimate Partner Violence: Not At Risk (07/22/2018)   Humiliation, Afraid, Rape, and Kick questionnaire    Fear of Current or Ex-Partner: No    Emotionally Abused: No    Physically Abused: No    Sexually Abused: No    Family History: Family History  Problem Relation Age of Onset   Depression Mother    Hypertension Mother    Obesity Mother    Diabetes Mother    Kidney disease Mother    Peripheral vascular disease Father    Atrial fibrillation Father    Crohn's disease Sister    Uterine cancer Sister 48       maternal half sister   COPD Brother    Osteoporosis Brother    Breast cancer Maternal Aunt 50   Colon cancer Neg Hx     Current Medications:  Current Outpatient Medications:    albuterol (VENTOLIN HFA) 108 (90 Base) MCG/ACT inhaler, Inhale 2 puffs into the lungs every 6 (six) hours as needed for wheezing or shortness of breath., Disp: , Rfl:    B Complex-C (B-COMPLEX WITH VITAMIN C) tablet, Take 1 tablet by mouth daily., Disp: , Rfl:    Bioflavonoid Products (VITAMIN C) CHEW, Chew 1 tablet by mouth daily., Disp: , Rfl:    Cholecalciferol (VITAMIN D) 50 MCG (2000 UT) CAPS, Take 2,000 Units by mouth daily., Disp: , Rfl:    Cyanocobalamin (B-12) 3000 MCG CAPS, Take 3,000 mcg by mouth every other day., Disp: , Rfl:    diazepam (VALIUM) 5 MG tablet, Take 1 tablet (5 mg total) by mouth at bedtime., Disp: 30 tablet, Rfl: 5   dicyclomine (BENTYL) 10 MG capsule, Take 1 capsule (10 mg total) by mouth 3 (three) times daily as needed for spasms., Disp: 90 capsule, Rfl: 3   docusate sodium (COLACE) 250 MG capsule, Take 250 mg by mouth 2 (two) times daily., Disp: , Rfl:    folic acid (FOLVITE) 1 MG tablet, Take 1 tablet (1 mg  total) by mouth daily., Disp: 30 tablet, Rfl: 1   folic acid (FOLVITE) 400 MCG tablet, Take 400 mcg by mouth daily., Disp: , Rfl:    gabapentin (NEURONTIN) 100 MG capsule, Take 3 capsules (300 mg total) by mouth daily. TAKE THREE (3) CAPSULES BY MOUTH AT BEDTIME, Disp: 90 capsule, Rfl: 3   linaclotide (LINZESS) 290 MCG CAPS capsule, Take 1 capsule (290 mcg total) by mouth daily before breakfast., Disp: 30 capsule, Rfl: 6   loratadine (CLARITIN) 10 MG tablet, Take 10 mg by mouth daily as needed for allergies., Disp: , Rfl:    LORazepam (ATIVAN) 0.5 MG tablet, Take 0.5 mg by mouth 3 (three) times daily as needed for anxiety., Disp: , Rfl:    magic mouthwash w/lidocaine SOLN, Take 5 mLs by mouth 3 (three) times daily as needed for mouth pain (Swish and spit)., Disp: 480 mL, Rfl: 6   magnesium oxide (MAG-OX) 400 (240 Mg) MG tablet, Take 1 tablet (400 mg total)  by mouth 2 (two) times daily., Disp: 60 tablet, Rfl: 6   olaparib (LYNPARZA) 150 MG tablet, Take 2 tablets (300 mg total) by mouth 2 (two) times daily. Swallow whole. May take with food to decrease nausea and vomiting., Disp: 120 tablet, Rfl: 2   omeprazole (PRILOSEC) 40 MG capsule, TAKE ONE CAPSULE BY MOUTH TWICE DAILY BEFORE MEALS, Disp: 60 capsule, Rfl: 5   ondansetron (ZOFRAN) 4 MG tablet, Take 1 tablet (4 mg total) by mouth every 8 (eight) hours as needed for nausea or vomiting., Disp: 40 tablet, Rfl: 3   Oxycodone HCl 20 MG TABS, Take 1 tablet (20 mg total) by mouth every 4 (four) hours as needed., Disp: 180 tablet, Rfl: 0   prochlorperazine (COMPAZINE) 10 MG tablet, Take 1 tablet (10 mg total) by mouth 2 (two) times daily as needed for nausea or vomiting. Take 30 minutes before Lynparza twice daily., Disp: 60 tablet, Rfl: 3   promethazine (PHENERGAN) 25 MG tablet, Take 1 tablet (25 mg total) by mouth every 6 (six) hours as needed for nausea or vomiting., Disp: 60 tablet, Rfl: 4   rivaroxaban (XARELTO) 10 MG TABS tablet, Take 1 tablet (10 mg  total) by mouth daily., Disp: 14 tablet, Rfl: 0   rosuvastatin (CRESTOR) 5 MG tablet, Take 5 mg by mouth daily., Disp: , Rfl:    SUMAtriptan (IMITREX) 100 MG tablet, TAKE ONE TABLET BY MOUTH PRN UP to TWICE DAILY AS NEEDED. (Patient taking differently: Take 100 mg by mouth 2 (two) times daily as needed for migraine.), Disp: 9 tablet, Rfl: 5   triamcinolone cream (KENALOG) 0.1 %, Apply 1 application topically 2 (two) times daily as needed (rash)., Disp: 80 g, Rfl: 11   venlafaxine (EFFEXOR) 75 MG tablet, Take 1 tablet (75 mg total) by mouth daily., Disp: 90 tablet, Rfl: 3 No current facility-administered medications for this visit.  Facility-Administered Medications Ordered in Other Visits:    0.9 %  sodium chloride infusion, , Intravenous, Continuous, Doreatha Massed, MD, Stopped at 10/23/20 1547   sodium chloride flush (NS) 0.9 % injection 10 mL, 10 mL, Intravenous, PRN, Doreatha Massed, MD, 10 mL at 10/23/20 1544   Allergies: Allergies  Allergen Reactions   Morphine And Codeine Itching   Nickel Itching   Nortriptyline Other (See Comments)    Significant weight gain   Topamax [Topiramate] Diarrhea and Nausea Only   Xanax [Alprazolam]     Can't wake up    Actifed Cold-Allergy [Chlorpheniramine-Phenyleph Er] Rash    Red dye only   Amoxicillin Rash    Did it involve swelling of the face/tongue/throat, SOB, or low BP? Unknown Did it involve sudden or severe rash/hives, skin peeling, or any reaction on the inside of your mouth or nose? Unknown Did you need to seek medical attention at a hospital or doctor's office? Unknown When did it last happen? teenager       If all above answers are "NO", may proceed with cephalosporin use.    Codeine Hives   Erythromycin Rash   Penicillins Rash    Did it involve swelling of the face/tongue/throat, SOB, or low BP? Unknown Did it involve sudden or severe rash/hives, skin peeling, or any reaction on the inside of your mouth or nose?  Unknown Did you need to seek medical attention at a hospital or doctor's office? Unknown When did it last happen? teenager       If all above answers are "NO", may proceed with cephalosporin use.    Sudafed [  Pseudoephedrine Hcl] Rash    Red dye only    REVIEW OF SYSTEMS:   Review of Systems  Constitutional:  Negative for chills, fatigue and fever.  HENT:   Negative for lump/mass, mouth sores, nosebleeds, sore throat and trouble swallowing.   Eyes:  Negative for eye problems.  Respiratory:  Negative for cough and shortness of breath.   Cardiovascular:  Negative for chest pain, leg swelling and palpitations.  Gastrointestinal:  Positive for nausea and vomiting. Negative for abdominal pain, constipation and diarrhea.  Genitourinary:  Negative for bladder incontinence, difficulty urinating, dysuria, frequency, hematuria and nocturia.        +vaginal pain, 7/10 severity  Musculoskeletal:  Positive for arthralgias (left shoulder, 7/10 severity). Negative for back pain, flank pain, myalgias and neck pain.  Skin:  Negative for itching and rash.  Neurological:  Negative for dizziness, headaches and numbness.  Hematological:  Does not bruise/bleed easily.  Psychiatric/Behavioral:  Negative for depression, sleep disturbance and suicidal ideas. The patient is not nervous/anxious.   All other systems reviewed and are negative.    VITALS:   Blood pressure 122/72, pulse 86, temperature 98.2 F (36.8 C), temperature source Oral, resp. rate 18, weight 188 lb 12.8 oz (85.6 kg), SpO2 96%.  Wt Readings from Last 3 Encounters:  11/19/22 188 lb 12.8 oz (85.6 kg)  11/12/22 187 lb 9.6 oz (85.1 kg)  08/12/22 180 lb (81.6 kg)    Body mass index is 32.41 kg/m.  Performance status (ECOG): 1 - Symptomatic but completely ambulatory  PHYSICAL EXAM:   Physical Exam Vitals and nursing note reviewed. Exam conducted with a chaperone present.  Constitutional:      Appearance: Normal appearance.   Cardiovascular:     Rate and Rhythm: Normal rate and regular rhythm.     Pulses: Normal pulses.     Heart sounds: Normal heart sounds.  Pulmonary:     Effort: Pulmonary effort is normal.     Breath sounds: Normal breath sounds.  Abdominal:     Palpations: Abdomen is soft. There is no hepatomegaly, splenomegaly or mass.     Tenderness: There is abdominal tenderness in the left upper quadrant.     Comments: +LUQ pain  Musculoskeletal:     Right lower leg: No edema.     Left lower leg: No edema.  Lymphadenopathy:     Cervical: No cervical adenopathy.     Right cervical: No superficial, deep or posterior cervical adenopathy.    Left cervical: No superficial, deep or posterior cervical adenopathy.     Upper Body:     Right upper body: No supraclavicular or axillary adenopathy.     Left upper body: No supraclavicular or axillary adenopathy.  Neurological:     General: No focal deficit present.     Mental Status: She is alert and oriented to person, place, and time.  Psychiatric:        Mood and Affect: Mood normal.        Behavior: Behavior normal.     LABS:      Latest Ref Rng & Units 11/12/2022   10:56 AM 08/05/2022    3:22 PM 05/27/2022    2:42 PM  CBC  WBC 4.0 - 10.5 K/uL 9.5  7.2  6.4   Hemoglobin 12.0 - 15.0 g/dL 16.1  09.6  04.5   Hematocrit 36.0 - 46.0 % 38.6  39.5  37.5   Platelets 150 - 400 K/uL 407  255  290  Latest Ref Rng & Units 11/12/2022   10:56 AM 08/05/2022    3:22 PM 05/27/2022    2:42 PM  CMP  Glucose 70 - 99 mg/dL 540  981  191   BUN 6 - 20 mg/dL 13  12  9    Creatinine 0.44 - 1.00 mg/dL 4.78  2.95  6.21   Sodium 135 - 145 mmol/L 134  131  137   Potassium 3.5 - 5.1 mmol/L 4.1  3.7  3.5   Chloride 98 - 111 mmol/L 99  97  101   CO2 22 - 32 mmol/L 25  25  26    Calcium 8.9 - 10.3 mg/dL 8.7  8.8  9.1   Total Protein 6.5 - 8.1 g/dL 6.7  6.8  7.2   Total Bilirubin 0.3 - 1.2 mg/dL 0.3  0.5  0.3   Alkaline Phos 38 - 126 U/L 197  99  84   AST 15 - 41  U/L 27  28  22    ALT 0 - 44 U/L 24  20  21       No results found for: "CEA1", "CEA" / No results found for: "CEA1", "CEA" No results found for: "PSA1" No results found for: "CAN199" Lab Results  Component Value Date   CAN125 151.0 (H) 11/12/2022    No results found for: "TOTALPROTELP", "ALBUMINELP", "A1GS", "A2GS", "BETS", "BETA2SER", "GAMS", "MSPIKE", "SPEI" Lab Results  Component Value Date   TIBC 288 11/12/2022   TIBC 329 08/05/2022   TIBC 346 05/27/2022   FERRITIN 206 11/12/2022   FERRITIN 219 08/05/2022   FERRITIN 67 05/27/2022   IRONPCTSAT 17 11/12/2022   IRONPCTSAT 29 08/05/2022   IRONPCTSAT 14 05/27/2022   Lab Results  Component Value Date   LDH 277 (H) 07/18/2018   LDH 193 (H) 07/07/2018     STUDIES:   No results found.

## 2022-11-19 NOTE — Progress Notes (Signed)
Specialty Pharmacy Refill Coordination Note  Michelle Holt is a 52 y.o. female contacted today regarding refills of specialty medication(s) Olaparib   Patient requested Delivery   Delivery date: 11/27/22   Verified address: [name]   [place] [id number]   Medication will be filled on 11/26/22.

## 2022-11-19 NOTE — Patient Instructions (Addendum)
[facility] at [facility] Discharge Instructions   You were seen and examined today by Dr. Ellin Saba.  He reviewed the results of your lab work which is mostly normal. Your CA 125 is elevated at 151. We will obtain a CT scan to look into this.  We will see you back after the scan to review the results.   Return as scheduled.      Thank you for choosing [facility] at [facility] to provide your oncology and hematology care.  To afford each patient quality time with our provider, please arrive at least 15 minutes before your scheduled appointment time.   If you have a lab appointment with the Cancer Center please come in thru the Main Entrance and check in at the main information desk.  You need to re-schedule your appointment should you arrive 10 or more minutes late.  We strive to give you quality time with our providers, and arriving late affects you and other patients whose appointments are after yours.  Also, if you no show three or more times for appointments you may be dismissed from the clinic at the providers discretion.     Again, thank you for choosing [facility].  Our hope is that these requests will decrease the amount of time that you wait before being seen by our physicians.       _____________________________________________________________  Should you have questions after your visit to [facility], please contact our office at [phone] and follow the prompts.  Our office hours are 8:00 a.m. and 4:30 p.m. Monday - Friday.  Please note that voicemails left after 4:00 p.m. may not be returned until the following business day.  We are closed weekends and major holidays.  You do have access to a nurse 24-7, just call the main number to the clinic [phone] and do not press any options, hold on the line and a nurse will answer the phone.    For prescription refill requests, have your pharmacy contact  our office and allow 72 hours.    Due to Covid, you will need to wear a mask upon entering the hospital. If you do not have a mask, a mask will be given to you at the Main Entrance upon arrival. For doctor visits, patients may have 1 support person age 9 or older with them. For treatment visits, patients can not have anyone with them due to social distancing guidelines and our immunocompromised population.

## 2022-11-22 ENCOUNTER — Ambulatory Visit (HOSPITAL_COMMUNITY)
Admission: RE | Admit: 2022-11-22 | Discharge: 2022-11-22 | Disposition: A | Discharge status: Home / Self Care | Payer: 59 | Source: Ambulatory Visit | Attending: Hematology | Admitting: Hematology

## 2022-11-22 DIAGNOSIS — C563 Malignant neoplasm of bilateral ovaries: Secondary | ICD-10-CM | POA: Diagnosis present

## 2022-11-22 DIAGNOSIS — N289 Disorder of kidney and ureter, unspecified: Secondary | ICD-10-CM | POA: Diagnosis not present

## 2022-11-22 DIAGNOSIS — R188 Other ascites: Secondary | ICD-10-CM | POA: Diagnosis not present

## 2022-11-22 DIAGNOSIS — C786 Secondary malignant neoplasm of retroperitoneum and peritoneum: Secondary | ICD-10-CM | POA: Diagnosis not present

## 2022-11-22 MED ORDER — IOHEXOL 300 MG/ML  SOLN
100.0000 mL | Freq: Once | INTRAMUSCULAR | Status: AC | PRN
  Administered 2022-11-22: 100 mL via INTRAVENOUS

## 2022-11-22 MED ORDER — IOHEXOL 300 MG/ML  SOLN
30.0000 mL | Freq: Once | INTRAMUSCULAR | Status: AC | PRN
  Administered 2022-11-22: 30 mL via ORAL

## 2022-11-27 NOTE — Progress Notes (Signed)
[facility] [address] [place] [address]    Clinic Day:  11/27/22   Referring physician: Ignatius Specking, MD  Patient Care Team: Ignatius Specking, MD as PCP - General (Internal Medicine) Doreatha Massed, MD as Medical Oncologist (Oncology)   ASSESSMENT & PLAN:   Assessment: 1.  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 -Robotic assisted laparoscopic TAH and BSO and omentectomy on 12/24/2018, pathology showing high-grade serous carcinoma, PT3P NX. -Germline mutation testing was negative. -3 more cycles of adjuvant chemotherapy completed on 03/15/2019. -CTAP on 04/13/2019 showed no findings of active malignancy.  28% reduction in the volume of presumed chronic hematoma/chronic fluid collection splaying the upper margin of the spleen.  Large type III hiatal hernia. -CTAP on 10/13/2019 shows no findings of recurrence or metastatic disease. - Foundation 1 shows HRD+, LOH score>16%.  MSI-stable.  MYC amplification.  T p53 mutation. - We reviewed CT CAP from 02/21/2021 which showed progressive peritoneal metastasis compared to 12/06/2020 scan.  No bowel obstruction.  Small right pleural effusion with mild enlargement of an isolated left external iliac lymph node. - Reviewed EGD from 02/28/2021 which showed hiatal hernia and normal findings. - She reported epigastric and left upper quadrant pain worse in the last 1 week which is related to progression of her malignancy. - 6 cycles of carboplatin and paclitaxel from 03/13/2021 through 06/18/2021 - CT CAP (07/12/2021): No evidence of recurrence or metastatic disease. - Olaparib 300 mg twice daily started on 08/22/2021.    Plan: 1.  Clinical stage IVa high-grade serous ovarian cancer, positive cytology of left pleural effusion, HRD positive: - CTAP on 08/05/2022: No new or progressive findings.  Large  hiatal hernia. - She underwent hiatal hernia surgery on August 22, 2022 at [facility]. - She is having difficulty swallowing solid food since then. - She also reported left shoulder pain which started in the middle of August, predominantly at nighttime.  She uses Salonpas which helps. - Reviewed labs today: Normal LFTs and creatinine.  CBC grossly normal.  However CA125 Inc. is to 151 from 9.7. - Recommend restaging CT CAP with contrast.   2.  Lower back/left upper quadrant pain: - Continue oxycodone 20 mg every 4-6 hours as needed.   3.  Numbness in the feet/cramping in the hands: - Continue gabapentin 200 mg at bedtime.  4.  Iron deficiency state: - Last Feraheme on 06/24/2022.  Ferritin is 206 and hemoglobin normal at 12.8.  No indication for parenteral iron therapy.  5.  Constipation: - Continue stool softener twice daily.  Well-controlled.  6.  Hypomagnesemia: - Continue magnesium twice daily.  Magnesium is 2.0.    No orders of the defined types were placed in this encounter.     Alben Deeds Teague,acting as a Neurosurgeon for Doreatha Massed, MD.,have documented all relevant documentation on the behalf of Doreatha Massed, MD,as directed by  Doreatha Massed, MD while in the presence of Doreatha Massed, MD.  ***     [place] R Teague   11/13/20241:20 PM  CHIEF COMPLAINT:   Diagnosis: high-grade serous ovarian cancer    Cancer Staging  No matching staging information was found for the patient.    Prior Therapy: 1. Carboplatin and paclitaxel x 7 cycles from 08/24/2018 to 03/15/2019. 2. Laparoscopic TAH & BSO & omenectomy on 12/24/2018. 3.  6 cycles of carboplatin and paclitaxel from 03/05/2021 to  06/18/2021  Current Therapy:  olaparib    HISTORY OF PRESENT ILLNESS:   Oncology History  Ovarian cancer, bilateral (HCC)  07/07/2018 Pathology Results   PLEURAL FLUID, LEFT (SPECIMEN 1 OF 1, COLLECTED 07/07/18): - MALIGNANT CELLS CONSISTENT WITH  ADENOCARCINOMA - SEE COMMENT  Source Pleural Fluid, (Specimen 1 of 1, collected on 07/07/2018) Gross Specimen: Received is/are 1000cc of bloody red fluid with tissue. (TC:tc) Prepared: # Smears: 0 # Concentration Technique Slides (i.e. ThinPrep): 1 # Cell Block: 1 Conventional Additional Studies: Two Hematology slides labeled [id number] Comment The malignant cells are positive for cytokeratin 7, p53, WT-1, Pax-8, Moc31, ER (weak) and EMA but negative for cytokeratin 20, TTF-1, GATA-3, CDX-2 and D2-40. Overall, the phenotype is consistent with a gynecologic primary; clinical correlation recommended.   07/07/2018 Procedure   Successful ultrasound guided left thoracentesis yielding 2.0 L of pleural fluid   07/08/2018 Procedure   1. Technically successful placement of left 14 French pigtail chest drain, placed to Pleur-evac water-seal.   07/08/2018 Procedure   1. Technically successful five [place] double lumen power injectable PICC placement   07/09/2018 Imaging   Ct chest 1. There is a moderate, loculated left hydropneumothorax with a small air component and moderate fluid component. The largest loculated component is located posteriorly. There is a pigtail drainage catheter about the lateral pleural space. There is no obvious etiology, such as obvious mass or pleural disease.   2. There is a small right pleural effusion with associated atelectasis or consolidation and a subpleural consolidation of the superior segment right lower lobe (series 4, image 56), of uncertain significance, possibly infectious or inflammatory   07/10/2018 Imaging   Ct abdomen and pelvis: 1. The bilateral ovaries are enlarged by heterogeneous appearing cystic lesions, measuring 5.3 x 4.2 cm on the right (series 4, image 72) and 4.5 x 3.2 cm on the left (series 4, image 75). Consider dedicated pelvic ultrasound and/or pelvic MRI to further evaluate for solid components given high suspicion for GYN primary malignancy.    2. No other evidence of mass and no lymphadenopathy in the abdomen or pelvis.   3. Trace ascites. There is some suggestion of omental and peritoneal nodularity (e.g. Series 4, image 43), concerning for peritoneal metastatic disease.    4. Loculated left-sided pleural effusion with left-sided pleural drainage catheter in position. Small right pleural effusion   07/13/2018 Surgery   OPERATION: 1.  Left VATS (video-assisted thoracoscopic surgery) for drainage of loculated pleural effusion. 2.  Talc pleurodesis for malignant pleural effusion. 3.  Placement of PleurX catheter for management of malignant pleural effusion. 4.  Placement of On-Q analgesia catheter system.    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   07/13/2018 Pathology Results   Pleura, peel, Left Pleural - FIBRO-FIBRINOUS PLEURITIS - NEGATIVE FOR MALIGNANCY   07/18/2018 Initial Diagnosis   Ovarian cancer, bilateral (HCC)   07/20/2018 Procedure   EGD impression: Normal proximal esophagus and mid esophagus. Mild distal esophageal rings; dilation not performed because of esophagitis. LA Grade C reflux esophagitis. Z-line regular, 30 cm from the incisors. 5 cm hiatal hernia. Non-bleeding gastric ulcer with no stigmata of bleeding. Gastritis. Duodenal erosions without bleeding. Normal second portion of the duodenum. No specimens collected.   08/19/2018 Genetic Testing   Negative genetic testing on the common hereditary cancer panel.  The Common Hereditary Gene Panel  offered by [facility] includes sequencing and/or deletion duplication testing of the following 48 genes: APC, ATM, AXIN2, BARD1, BMPR1A, BRCA1, BRCA2, BRIP1, CDH1, CDK4, CDKN2A (p14ARF), CDKN2A (p16INK4a), CHEK2, CTNNA1, DICER1, EPCAM (Deletion/duplication testing only), GREM1  (promoter region deletion/duplication testing only), KIT, MEN1, MLH1, MSH2, MSH3, MSH6, MUTYH, NBN, NF1, NHTL1, PALB2, PDGFRA, PMS2, POLD1, POLE, PTEN, RAD50, RAD51C, RAD51D, RNF43, SDHB, SDHC, SDHD, SMAD4, SMARCA4. STK11, TP53, TSC1, TSC2, and VHL.  The following genes were evaluated for sequence changes only: SDHA and HOXB13 c.251G>A variant only. The report date is August 19, 2018.   08/24/2018 - 03/15/2019 Chemotherapy   Patient is on Treatment Plan : OVARIAN Carboplatin (AUC 6) / Paclitaxel (175) q21d x 6 cycles     03/05/2021 - 06/18/2021 Chemotherapy   Patient is on Treatment Plan : OVARIAN Carboplatin (AUC 6) / Paclitaxel (175) q21d x 6 cycles        INTERVAL HISTORY:   Jagger is a 52 y.o. female presenting to clinic today for follow up of high-grade serous ovarian cancer. She was last seen by me on 11/19/22.  Since her last visit, she underwent CT C/A/P on 11/22/22 that found: new peritoneal carcinomatosis with small volume ascites; no evidence of metastatic disease within the chest; similar appearance of the cystic exophytic splenic lesion with internal calcifications; and moderate-sized hiatal hernia.  Today, she states that she is doing well overall. Her appetite level is at ***%. Her energy level is at ***%.  PAST MEDICAL HISTORY:   Past Medical History: Past Medical History:  Diagnosis Date   Anemia    Anxiety and depression    Arthritis of facet joints at multiple vertebral levels    L5-S1   Constipation    Dyslipidemia    Family history of breast cancer    Family history of uterine cancer    GERD (gastroesophageal reflux disease)    History of hiatal hernia    History of kidney stones    Insomnia    Irritable bowel syndrome    Migraine    Muscle tension headache    Neuropathy of finger    Ovarian carcinoma (HCC)    ovarian   Plantar fasciitis of right foot    Port-A-Cath in place 08/20/2018    Surgical History: Past Surgical History:  Procedure Laterality Date    ABDOMINAL HYSTERECTOMY     BIOPSY  10/27/2020   Procedure: BIOPSY;  Surgeon: Dolores Frame, MD;  [facility];  Service: Gastroenterology;;   CHOLECYSTECTOMY  2008   COLONOSCOPY N/A 08/13/2013   Procedure: COLONOSCOPY;  Surgeon: Malissa Hippo, MD;  [facility];  Service: Endoscopy;  Laterality: N/A;  230-moved to 145 Ann to notify pt   COLONOSCOPY WITH PROPOFOL N/A 11/15/2020   Procedure: COLONOSCOPY WITH PROPOFOL;  Surgeon: Malissa Hippo, MD;  [facility];  Service: Endoscopy;  Laterality: N/A;  1:40   ESOPHAGEAL DILATION N/A 02/28/2021   Procedure: ESOPHAGEAL DILATION;  Surgeon: Malissa Hippo, MD;  [facility];  Service: Endoscopy;  Laterality: N/A;   ESOPHAGOGASTRODUODENOSCOPY     ESOPHAGOGASTRODUODENOSCOPY (EGD) WITH PROPOFOL N/A 07/20/2018   Procedure: ESOPHAGOGASTRODUODENOSCOPY (EGD) WITH PROPOFOL;  Surgeon: Malissa Hippo, MD;  [facility];  Service: Endoscopy;  Laterality: N/A;  Possible esophageal dilation.   ESOPHAGOGASTRODUODENOSCOPY (EGD) WITH PROPOFOL N/A 10/27/2020   Procedure: ESOPHAGOGASTRODUODENOSCOPY (EGD) WITH PROPOFOL;  Surgeon: Dolores Frame, MD;  [facility];  Service: Gastroenterology;  Laterality: N/A;  2:10, pt  knows to arrive at 10:15   ESOPHAGOGASTRODUODENOSCOPY (EGD) WITH PROPOFOL N/A 02/28/2021   Procedure: ESOPHAGOGASTRODUODENOSCOPY (EGD) WITH PROPOFOL;  Surgeon: Malissa Hippo, MD;  [facility];  Service: Endoscopy;  Laterality: N/A;  200 ASA 1   ESOPHAGOGASTRODUODENOSCOPY (EGD) WITH PROPOFOL N/A 03/29/2022   Procedure: ESOPHAGOGASTRODUODENOSCOPY (EGD) WITH PROPOFOL;  Surgeon: Dolores Frame, MD;  [facility];  Service: Gastroenterology;  Laterality: N/A;  2:00 pm, asa 1-2   GIVENS CAPSULE STUDY N/A 11/24/2020   Procedure: GIVENS CAPSULE STUDY;  Surgeon: Malissa Hippo, MD;  [facility];  Service: Endoscopy;   Laterality: N/A;  7:30   IR ANGIOGRAM SELECTIVE EACH ADDITIONAL VESSEL  08/01/2018   IR ANGIOGRAM VISCERAL SELECTIVE  08/01/2018   IR EMBO ART  VEN HEMORR LYMPH EXTRAV  INC GUIDE ROADMAPPING  08/01/2018   IR IMAGING GUIDED PORT INSERTION  08/20/2018   IR PERC PLEURAL DRAIN W/INDWELL CATH W/IMG GUIDE  07/08/2018   IR THORACENTESIS ASP PLEURAL SPACE W/IMG GUIDE  07/07/2018   IR US GUIDE VASC ACCESS RIGHT  08/01/2018   PLEURAL EFFUSION DRAINAGE Left 07/13/2018   Procedure: DRAINAGE OF LOCULATED PLEURAL EFFUSION;  Surgeon: Kerin Perna, MD;  Location: [facility] OR;  Service: Thoracic;  Laterality: Left;   POLYPECTOMY  11/15/2020   Procedure: POLYPECTOMY;  Surgeon: Malissa Hippo, MD;  [facility];  Service: Endoscopy;;   REMOVAL OF PLEURAL DRAINAGE CATHETER Left 08/20/2018   Procedure: REMOVAL OF PLEURAL DRAINAGE CATHETER;  Surgeon: Kerin Perna, MD;  Location: [facility] OR;  Service: Thoracic;  Laterality: Left;   REMOVAL OF PLEURAL DRAINAGE CATHETER Left 08/20/2018   Procedure: REMOVAL OF PLEURAL DRAINAGE CATHETER;  Surgeon: Kerin Perna, MD;  Location: [facility] OR;  Service: Thoracic;  Laterality: Left;   TALC PLEURODESIS Left 07/13/2018   Procedure: Talc Pleuradesis;  Surgeon: Donata Clay, Theron Arista, MD;  Location: [facility] OR;  Service: Thoracic;  Laterality: Left;   TOTAL HIP ARTHROPLASTY Left 06/05/2020   Procedure: LEFT TOTAL HIP ARTHROPLASTY ANTERIOR APPROACH;  Surgeon: Tarry Kos, MD;  Location: [facility] OR;  Service: Orthopedics;  Laterality: Left;  3-C   TUBAL LIGATION Bilateral    UTERINE ABLATION     VIDEO ASSISTED THORACOSCOPY Left 07/13/2018   Procedure: VIDEO ASSISTED THORACOSCOPY;  Surgeon: Kerin Perna, MD;  Location: [facility] OR;  Service: Thoracic;  Laterality: Left;    Social History: Social History   Socioeconomic History   Marital status: Widowed    Spouse name: Not on file   Number of children: 2   Years of education: 2-College   Highest education level: Not on file  Occupational History     Employer: [employer]  Tobacco Use   Smoking status: Former    Current packs/day: 0.00    Average packs/day: 0.5 packs/day for 17.0 years (8.5 ttl pk-yrs)    Types: Cigarettes    Start date: 06/21/2001    Quit date: 06/22/2018    Years since quitting: 4.4   Smokeless tobacco: Never  Vaping Use   Vaping status: Never Used  Substance and Sexual Activity   Alcohol use: Yes    Alcohol/week: 1.0 standard drink of alcohol    Types: 1 Glasses of wine per week    Comment: occasionally   Drug use: No   Sexual activity: Not Currently  Other Topics Concern   Not on file  Social History Narrative   Patient lives at home with her daughter.    Patient has 2  children.    Patient is widowed.    Patient is right handed.    Patient has her Associates degree.      Social Determinants of Health   Financial Resource Strain: Low Risk  (08/26/2022)   Received from [facility]   Overall Financial Resource Strain (CARDIA)    Difficulty of Paying Living Expenses: Not hard at all  Food Insecurity: No Food Insecurity (08/26/2022)   Received from [facility]   Hunger Vital Sign    Worried About Running Out of Food in the Last Year: Never true    Ran Out of Food in the Last Year: Never true  Transportation Needs: No Transportation Needs (08/26/2022)   Received from [facility] - Transportation    In the past 12 months, has lack of transportation kept you from medical appointments or from getting medications?: No    Lack of Transportation (Non-Medical): No  Physical Activity: Inactive (07/22/2018)   Exercise Vital Sign    Days of Exercise per Week: 0 days    Minutes of Exercise per Session: 0 min  Stress: Stress Concern Present (07/22/2018)   [employer] of Occupational Health - Occupational Stress Questionnaire    Feeling of Stress : Very much  Social Connections: Moderately Isolated (07/22/2018)   Social Connection and Isolation Panel  [NHANES]    Frequency of Communication with Friends and Family: More than three times a week    Frequency of Social Gatherings with Friends and Family: More than three times a week    Attends Religious Services: 1 to 4 times per year    Active Member of [employer] or Organizations: No    Attends Banker Meetings: Never    Marital Status: Widowed  Intimate Partner Violence: Not At Risk (07/22/2018)   Humiliation, Afraid, Rape, and Kick questionnaire    Fear of Current or Ex-Partner: No    Emotionally Abused: No    Physically Abused: No    Sexually Abused: No    Family History: Family History  Problem Relation Age of Onset   Depression Mother    Hypertension Mother    Obesity Mother    Diabetes Mother    Kidney disease Mother    Peripheral vascular disease Father    Atrial fibrillation Father    Crohn's disease Sister    Uterine cancer Sister 71       maternal half sister   COPD Brother    Osteoporosis Brother    Breast cancer Maternal Aunt 50   Colon cancer Neg Hx     Current Medications:  Current Outpatient Medications:    albuterol (VENTOLIN HFA) 108 (90 Base) MCG/ACT inhaler, Inhale 2 puffs into the lungs every 6 (six) hours as needed for wheezing or shortness of breath., Disp: , Rfl:    B Complex-C (B-COMPLEX WITH VITAMIN C) tablet, Take 1 tablet by mouth daily., Disp: , Rfl:    Bioflavonoid Products (VITAMIN C) CHEW, Chew 1 tablet by mouth daily., Disp: , Rfl:    Cholecalciferol (VITAMIN D) 50 MCG (2000 UT) CAPS, Take 2,000 Units by mouth daily., Disp: , Rfl:    Cyanocobalamin (B-12) 3000 MCG CAPS, Take 3,000 mcg by mouth every other day., Disp: , Rfl:    diazepam (VALIUM) 5 MG tablet, Take 1 tablet (5 mg total) by mouth at bedtime., Disp: 30 tablet, Rfl: 5   dicyclomine (BENTYL) 10 MG capsule, Take 1 capsule (10 mg total) by mouth  3 (three) times daily as needed for spasms., Disp: 90 capsule, Rfl: 3   docusate sodium (COLACE) 250 MG capsule, Take 250 mg by mouth  2 (two) times daily., Disp: , Rfl:    folic acid (FOLVITE) 1 MG tablet, Take 1 tablet (1 mg total) by mouth daily., Disp: 30 tablet, Rfl: 1   folic acid (FOLVITE) 400 MCG tablet, Take 400 mcg by mouth daily., Disp: , Rfl:    gabapentin (NEURONTIN) 100 MG capsule, Take 3 capsules (300 mg total) by mouth daily. TAKE THREE (3) CAPSULES BY MOUTH AT BEDTIME, Disp: 90 capsule, Rfl: 3   linaclotide (LINZESS) 290 MCG CAPS capsule, Take 1 capsule (290 mcg total) by mouth daily before breakfast., Disp: 30 capsule, Rfl: 6   loratadine (CLARITIN) 10 MG tablet, Take 10 mg by mouth daily as needed for allergies., Disp: , Rfl:    LORazepam (ATIVAN) 0.5 MG tablet, Take 0.5 mg by mouth 3 (three) times daily as needed for anxiety., Disp: , Rfl:    magic mouthwash w/lidocaine SOLN, Take 5 mLs by mouth 3 (three) times daily as needed for mouth pain (Swish and spit)., Disp: 480 mL, Rfl: 6   magnesium oxide (MAG-OX) 400 (240 Mg) MG tablet, Take 1 tablet (400 mg total) by mouth 2 (two) times daily., Disp: 60 tablet, Rfl: 6   olaparib (LYNPARZA) 150 MG tablet, Take 2 tablets (300 mg total) by mouth 2 (two) times daily. Swallow whole. May take with food to decrease nausea and vomiting., Disp: 120 tablet, Rfl: 2   omeprazole (PRILOSEC) 40 MG capsule, TAKE ONE CAPSULE BY MOUTH TWICE DAILY BEFORE MEALS, Disp: 60 capsule, Rfl: 5   ondansetron (ZOFRAN) 4 MG tablet, Take 1 tablet (4 mg total) by mouth every 8 (eight) hours as needed for nausea or vomiting., Disp: 40 tablet, Rfl: 3   Oxycodone HCl 20 MG TABS, Take 1 tablet (20 mg total) by mouth every 4 (four) hours as needed., Disp: 180 tablet, Rfl: 0   prochlorperazine (COMPAZINE) 10 MG tablet, Take 1 tablet (10 mg total) by mouth 2 (two) times daily as needed for nausea or vomiting. Take 30 minutes before Lynparza twice daily., Disp: 60 tablet, Rfl: 3   promethazine (PHENERGAN) 25 MG tablet, Take 1 tablet (25 mg total) by mouth every 6 (six) hours as needed for nausea or  vomiting., Disp: 60 tablet, Rfl: 4   rivaroxaban (XARELTO) 10 MG TABS tablet, Take 1 tablet (10 mg total) by mouth daily., Disp: 14 tablet, Rfl: 0   rosuvastatin (CRESTOR) 5 MG tablet, Take 5 mg by mouth daily., Disp: , Rfl:    SUMAtriptan (IMITREX) 100 MG tablet, TAKE ONE TABLET BY MOUTH PRN UP to TWICE DAILY AS NEEDED. (Patient taking differently: Take 100 mg by mouth 2 (two) times daily as needed for migraine.), Disp: 9 tablet, Rfl: 5   triamcinolone cream (KENALOG) 0.1 %, Apply 1 application topically 2 (two) times daily as needed (rash)., Disp: 80 g, Rfl: 11   venlafaxine (EFFEXOR) 75 MG tablet, Take 1 tablet (75 mg total) by mouth daily., Disp: 90 tablet, Rfl: 3 No current facility-administered medications for this visit.  Facility-Administered Medications Ordered in Other Visits:    0.9 %  sodium chloride infusion, , Intravenous, Continuous, Doreatha Massed, MD, Stopped at 10/23/20 1547   sodium chloride flush (NS) 0.9 % injection 10 mL, 10 mL, Intravenous, PRN, Doreatha Massed, MD, 10 mL at 10/23/20 1544   Allergies: Allergies  Allergen Reactions   Morphine And Codeine Itching  Nickel Itching   Nortriptyline Other (See Comments)    Significant weight gain   Topamax [Topiramate] Diarrhea and Nausea Only   Xanax [Alprazolam]     Can't wake up    Actifed Cold-Allergy [Chlorpheniramine-Phenyleph Er] Rash    Red dye only   Amoxicillin Rash    Did it involve swelling of the face/tongue/throat, SOB, or low BP? Unknown Did it involve sudden or severe rash/hives, skin peeling, or any reaction on the inside of your mouth or nose? Unknown Did you need to seek medical attention at a hospital or doctor's office? Unknown When did it last happen? teenager       If all above answers are "NO", may proceed with cephalosporin use.    Codeine Hives   Erythromycin Rash   Penicillins Rash    Did it involve swelling of the face/tongue/throat, SOB, or low BP? Unknown Did it involve  sudden or severe rash/hives, skin peeling, or any reaction on the inside of your mouth or nose? Unknown Did you need to seek medical attention at a hospital or doctor's office? Unknown When did it last happen? teenager       If all above answers are "NO", may proceed with cephalosporin use.    Sudafed [Pseudoephedrine Hcl] Rash    Red dye only    REVIEW OF SYSTEMS:   Review of Systems  Constitutional:  Negative for chills, fatigue and fever.  HENT:   Negative for lump/mass, mouth sores, nosebleeds, sore throat and trouble swallowing.   Eyes:  Negative for eye problems.  Respiratory:  Negative for cough and shortness of breath.   Cardiovascular:  Negative for chest pain, leg swelling and palpitations.  Gastrointestinal:  Negative for abdominal pain, constipation, diarrhea, nausea and vomiting.  Genitourinary:  Negative for bladder incontinence, difficulty urinating, dysuria, frequency, hematuria and nocturia.   Musculoskeletal:  Negative for arthralgias, back pain, flank pain, myalgias and neck pain.  Skin:  Negative for itching and rash.  Neurological:  Negative for dizziness, headaches and numbness.  Hematological:  Does not bruise/bleed easily.  Psychiatric/Behavioral:  Negative for depression, sleep disturbance and suicidal ideas. The patient is not nervous/anxious.   All other systems reviewed and are negative.    VITALS:   There were no vitals taken for this visit.  Wt Readings from Last 3 Encounters:  11/19/22 188 lb 12.8 oz (85.6 kg)  11/12/22 187 lb 9.6 oz (85.1 kg)  08/12/22 180 lb (81.6 kg)    There is no height or weight on file to calculate BMI.  Performance status (ECOG): 1 - Symptomatic but completely ambulatory  PHYSICAL EXAM:   Physical Exam Vitals and nursing note reviewed. Exam conducted with a chaperone present.  Constitutional:      Appearance: Normal appearance.  Cardiovascular:     Rate and Rhythm: Normal rate and regular rhythm.     Pulses: Normal  pulses.     Heart sounds: Normal heart sounds.  Pulmonary:     Effort: Pulmonary effort is normal.     Breath sounds: Normal breath sounds.  Abdominal:     Palpations: Abdomen is soft. There is no hepatomegaly, splenomegaly or mass.     Tenderness: There is no abdominal tenderness.  Musculoskeletal:     Right lower leg: No edema.     Left lower leg: No edema.  Lymphadenopathy:     Cervical: No cervical adenopathy.     Right cervical: No superficial, deep or posterior cervical adenopathy.    Left cervical:  No superficial, deep or posterior cervical adenopathy.     Upper Body:     Right upper body: No supraclavicular or axillary adenopathy.     Left upper body: No supraclavicular or axillary adenopathy.  Neurological:     General: No focal deficit present.     Mental Status: She is alert and oriented to person, place, and time.  Psychiatric:        Mood and Affect: Mood normal.        Behavior: Behavior normal.     LABS:      Latest Ref Rng & Units 11/12/2022   10:56 AM 08/05/2022    3:22 PM 05/27/2022    2:42 PM  CBC  WBC 4.0 - 10.5 K/uL 9.5  7.2  6.4   Hemoglobin 12.0 - 15.0 g/dL 96.2  95.2  84.1   Hematocrit 36.0 - 46.0 % 38.6  39.5  37.5   Platelets 150 - 400 K/uL 407  255  290       Latest Ref Rng & Units 11/12/2022   10:56 AM 08/05/2022    3:22 PM 05/27/2022    2:42 PM  CMP  Glucose 70 - 99 mg/dL 324  401  027   BUN 6 - 20 mg/dL 13  12  9    Creatinine 0.44 - 1.00 mg/dL 2.53  6.64  4.03   Sodium 135 - 145 mmol/L 134  131  137   Potassium 3.5 - 5.1 mmol/L 4.1  3.7  3.5   Chloride 98 - 111 mmol/L 99  97  101   CO2 22 - 32 mmol/L 25  25  26    Calcium 8.9 - 10.3 mg/dL 8.7  8.8  9.1   Total Protein 6.5 - 8.1 g/dL 6.7  6.8  7.2   Total Bilirubin 0.3 - 1.2 mg/dL 0.3  0.5  0.3   Alkaline Phos 38 - 126 U/L 197  99  84   AST 15 - 41 U/L 27  28  22    ALT 0 - 44 U/L 24  20  21       No results found for: "CEA1", "CEA" / No results found for: "CEA1", "CEA" No results  found for: "PSA1" No results found for: "CAN199" Lab Results  Component Value Date   CAN125 151.0 (H) 11/12/2022    No results found for: "TOTALPROTELP", "ALBUMINELP", "A1GS", "A2GS", "BETS", "BETA2SER", "GAMS", "MSPIKE", "SPEI" Lab Results  Component Value Date   TIBC 288 11/12/2022   TIBC 329 08/05/2022   TIBC 346 05/27/2022   FERRITIN 206 11/12/2022   FERRITIN 219 08/05/2022   FERRITIN 67 05/27/2022   IRONPCTSAT 17 11/12/2022   IRONPCTSAT 29 08/05/2022   IRONPCTSAT 14 05/27/2022   Lab Results  Component Value Date   LDH 277 (H) 07/18/2018   LDH 193 (H) 07/07/2018     STUDIES:   CT CHEST ABDOMEN PELVIS W CONTRAST  Result Date: 11/24/2022 CLINICAL DATA:  History of bilateral ovarian cancer, elevated CA 125 levels. * Tracking Code: BO * EXAM: CT CHEST, ABDOMEN, AND PELVIS WITH CONTRAST TECHNIQUE: Multidetector CT imaging of the chest, abdomen and pelvis was performed following the standard protocol during bolus administration of intravenous contrast. RADIATION DOSE REDUCTION: This exam was performed according to the departmental dose-optimization program which includes automated exposure control, adjustment of the mA and/or kV according to patient size and/or use of iterative reconstruction technique. CONTRAST:  OMNIPAQUE IOHEXOL 300 MG/ML  SOLN COMPARISON:  Multiple priors including CT August 05, 2022 and March 28, 2022. FINDINGS: CT CHEST FINDINGS Cardiovascular: Right chest Port-A-Cath with tip near the superior cavoatrial junction. Normal caliber thoracic aorta. No central pulmonary embolus on this nondedicated study. Normal size heart. No significant pericardial effusion/thickening. Mediastinum/Nodes: No suspicious thyroid nodule. No pathologically enlarged mediastinal, hilar or axillary lymph nodes. Moderate-sized hiatal hernia. Lungs/Pleura: No suspicious pulmonary nodules or masses. Left pleural calcifications likely related to prior talc pleurodesis. No pleural effusion.  No pneumothorax. Musculoskeletal: No aggressive lytic or blastic lesion of bone. Multilevel degenerative changes spine. CT ABDOMEN PELVIS FINDINGS Hepatobiliary: No suspicious hepatic lesion. Gallbladder surgically absent. Mild prominence of the biliary tree is similar prior and compatible with reservoir effect post cholecystectomy. Pancreas: No pancreatic ductal dilation or evidence of acute inflammation. Spleen: No splenomegaly. Similar appearance of the cystic exophytic splenic lesion with internal calcifications measuring 6.7 cm on image 43/2 previously 6.6 cm. Splenic artery embolization coils. Adrenals/Urinary Tract: Bilateral adrenal glands appear normal. No hydronephrosis. Kidneys demonstrate symmetric enhancement. Stable too small to accurately characterize left lower pole renal lesion on image 103/5 urinary bladder is unremarkable for degree of distension. Stomach/Bowel: Radiopaque enteric contrast material traverses distal loops of small bowel. No pathologic dilation of small or large bowel. Vascular/Lymphatic: Aortic atherosclerosis. Normal caliber abdominal aorta. Smooth IVC contours. The portal, splenic and superior mesenteric veins are patent. No pathologically enlarged abdominal or pelvic lymph nodes. Reproductive: Uterus is surgically absent without suspicious nodularity along the vaginal cuff. No new suspicious discrete adnexal mass. Other: Multifocal new peritoneal/omental implants with peritoneal thickening and small volume ascites for instance an omental implant in the right upper quadrant measures 2.6 cm on image 63/2. Musculoskeletal: No aggressive lytic or blastic lesion of bone. Multilevel degenerative changes spine. Left total hip arthroplasty. IMPRESSION: 1. New peritoneal carcinomatosis with small volume ascites. 2. No evidence of metastatic disease within the chest. 3. Similar appearance of the cystic exophytic splenic lesion with internal calcifications. 4. Moderate-sized hiatal hernia.  5.  Aortic Atherosclerosis (ICD10-I70.0). These results will be called to the ordering clinician or representative by the Radiologist Assistant, and communication documented in the PACS or [employer]. Electronically Signed   By: Maudry Mayhew M.D.   On: 11/24/2022 09:31

## 2022-11-28 ENCOUNTER — Inpatient Hospital Stay: Payer: 59 | Admitting: Hematology

## 2022-11-28 ENCOUNTER — Encounter: Payer: Self-pay | Admitting: Hematology

## 2022-11-28 VITALS — BP 119/64 | HR 85 | Temp 98.2°F | Resp 16 | Wt 180.8 lb

## 2022-11-28 DIAGNOSIS — C786 Secondary malignant neoplasm of retroperitoneum and peritoneum: Secondary | ICD-10-CM | POA: Diagnosis not present

## 2022-11-28 DIAGNOSIS — E611 Iron deficiency: Secondary | ICD-10-CM | POA: Diagnosis not present

## 2022-11-28 DIAGNOSIS — Z95828 Presence of other vascular implants and grafts: Secondary | ICD-10-CM | POA: Diagnosis not present

## 2022-11-28 DIAGNOSIS — Z79899 Other long term (current) drug therapy: Secondary | ICD-10-CM | POA: Diagnosis not present

## 2022-11-28 DIAGNOSIS — Z7901 Long term (current) use of anticoagulants: Secondary | ICD-10-CM | POA: Diagnosis not present

## 2022-11-28 DIAGNOSIS — C563 Malignant neoplasm of bilateral ovaries: Secondary | ICD-10-CM | POA: Diagnosis not present

## 2022-11-28 DIAGNOSIS — Z87891 Personal history of nicotine dependence: Secondary | ICD-10-CM | POA: Diagnosis not present

## 2022-11-28 DIAGNOSIS — R1012 Left upper quadrant pain: Secondary | ICD-10-CM | POA: Diagnosis not present

## 2022-11-28 NOTE — Patient Instructions (Addendum)
[facility] at [facility] Discharge Instructions   You were seen and examined today by Dr. Ellin Saba.  He reviewed the results of your CT scan. It is showing the cancer has returned in the peritoneum (lining of the abdomen). There is no evidence of cancer in any other organs in the chest, abdomen, or pelvis.   He discussed with you restarting treatment. You have declined any further treatment with chemotherapy at this time. Dr. Kirtland Bouchard discussed with you getting a biopsy of the cancer now to send special testing on the tissue to see if there are any non-chemo options to treat this cancer.   The biopsy, the results of the biopsy, and then the results of the special testing will take about 6 weeks altogether.   He also discussed with you a treatment option without the biopsy with an antibody called Avastin. This is an infusion that is given in the clinic every 3 weeks. He discussed with you doing the chemo drugs carboplatin and gemcitabine for 3 cycles and repeating a scan and then just doing maintenance Avastin every 3 weeks.   We will see you back after the biopsy to discuss the results.      Thank you for choosing [facility] at [facility] to provide your oncology and hematology care.  To afford each patient quality time with our provider, please arrive at least 15 minutes before your scheduled appointment time.   If you have a lab appointment with the Cancer Center please come in thru the Main Entrance and check in at the main information desk.  You need to re-schedule your appointment should you arrive 10 or more minutes late.  We strive to give you quality time with our providers, and arriving late affects you and other patients whose appointments are after yours.  Also, if you no show three or more times for appointments you may be dismissed from the clinic at the providers discretion.     Again, thank you for choosing [facility].   Our hope is that these requests will decrease the amount of time that you wait before being seen by our physicians.       _____________________________________________________________  Should you have questions after your visit to [facility], please contact our office at [phone] and follow the prompts.  Our office hours are 8:00 a.m. and 4:30 p.m. Monday - Friday.  Please note that voicemails left after 4:00 p.m. may not be returned until the following business day.  We are closed weekends and major holidays.  You do have access to a nurse 24-7, just call the main number to the clinic [phone] and do not press any options, hold on the line and a nurse will answer the phone.    For prescription refill requests, have your pharmacy contact our office and allow 72 hours.    Due to Covid, you will need to wear a mask upon entering the hospital. If you do not have a mask, a mask will be given to you at the Main Entrance upon arrival. For doctor visits, patients may have 1 support person age 99 or older with them. For treatment visits, patients can not have anyone with them due to social distancing guidelines and our immunocompromised population.

## 2022-11-28 NOTE — Progress Notes (Signed)
DISCONTINUE ON PATHWAY REGIMEN - Ovarian     A cycle is every 21 days:     Paclitaxel      Carboplatin   **Always confirm dose/schedule in your pharmacy ordering system**  REASON: Other Reason PRIOR TREATMENT: [id number]: Carboplatin AUC=6 + Paclitaxel 175 mg/m2 q21 Days; Stop Treatment After 6 Cycles If Complete Response, Otherwise Continue Treatment Until Progression or Unacceptable Toxicity TREATMENT RESPONSE: Unable to Evaluate  START ON PATHWAY REGIMEN - Ovarian     A cycle is every 21 days:     Gemcitabine      Carboplatin   **Always confirm dose/schedule in your pharmacy ordering system**  Patient Characteristics: Recurrent or Progressive Disease, Third Line, Platinum Sensitive and ? 6 Months Since Last Platinum Therapy BRCA Mutation Status: Absent Therapeutic Status: Recurrent or Progressive Disease Line of Therapy: Third Line  Intent of Therapy: Non-Curative / Palliative Intent, Discussed with Patient

## 2022-11-29 ENCOUNTER — Other Ambulatory Visit: Payer: Self-pay

## 2022-12-02 ENCOUNTER — Telehealth: Payer: Self-pay

## 2022-12-02 ENCOUNTER — Other Ambulatory Visit (HOSPITAL_COMMUNITY): Payer: Self-pay | Admitting: Internal Medicine

## 2022-12-02 ENCOUNTER — Other Ambulatory Visit: Payer: Self-pay | Admitting: *Deleted

## 2022-12-02 DIAGNOSIS — Z1231 Encounter for screening mammogram for malignant neoplasm of breast: Secondary | ICD-10-CM

## 2022-12-02 MED ORDER — FUROSEMIDE 20 MG PO TABS: 20.0000 mg | ORAL_TABLET | Freq: Every day | ORAL | 2 refills | Status: DC | PRN

## 2022-12-02 NOTE — Progress Notes (Signed)
Richarda Overlie, MD  Claudean Kinds CT guided omental biopsy.  See CT from 11/22/22, image 63, series 2.    [place]       Previous Messages    ----- Message ----- From: Claudean Kinds Sent: 11/28/2022  11:28 AM EST To: Caroleen Hamman, NT; Claudean Kinds; * Subject: Ct Biopsy                                      Procedure : CT biopsy  Reason; bx peritoneum - peritoneal carcinomatosis Dx: Ovarian cancer, bilateral (HCC) [C56.3 (ICD-10-CM)]    History: CT Chest abd pelv w/ , Ct abd pelv w/  Provider : Doreatha Massed, MD  Provider contact : Phone Icon [phone]

## 2022-12-02 NOTE — Patient Outreach (Signed)
Successful call to patient on today regarding preventative mammogram screening. Patient scheduled for December 25, 2022 at 1:00 p.m. at [facility].  Baruch Gouty [facility] Assistant [facility] Population Health [phone]

## 2022-12-05 DIAGNOSIS — G4730 Sleep apnea, unspecified: Secondary | ICD-10-CM | POA: Diagnosis not present

## 2022-12-05 DIAGNOSIS — Z299 Encounter for prophylactic measures, unspecified: Secondary | ICD-10-CM | POA: Diagnosis not present

## 2022-12-05 DIAGNOSIS — C786 Secondary malignant neoplasm of retroperitoneum and peritoneum: Secondary | ICD-10-CM | POA: Diagnosis not present

## 2022-12-05 DIAGNOSIS — I10 Essential (primary) hypertension: Secondary | ICD-10-CM | POA: Diagnosis not present

## 2022-12-09 ENCOUNTER — Other Ambulatory Visit: Payer: Self-pay | Admitting: *Deleted

## 2022-12-09 MED ORDER — OXYCODONE HCL 30 MG PO TABA: 30.0000 mg | ORAL_TABLET | ORAL | 0 refills | Status: DC | PRN

## 2022-12-09 NOTE — Telephone Encounter (Signed)
Please advise 

## 2022-12-13 ENCOUNTER — Other Ambulatory Visit (HOSPITAL_COMMUNITY): Payer: Self-pay

## 2022-12-15 ENCOUNTER — Other Ambulatory Visit: Payer: Self-pay

## 2022-12-15 ENCOUNTER — Observation Stay (HOSPITAL_COMMUNITY)
Admission: EM | Admit: 2022-12-15 | Discharge: 2022-12-16 | Discharge status: Against Medical Advice | Payer: 59 | Attending: Student | Admitting: Student

## 2022-12-15 ENCOUNTER — Emergency Department (HOSPITAL_COMMUNITY): Payer: 59

## 2022-12-15 ENCOUNTER — Encounter (HOSPITAL_COMMUNITY): Payer: Self-pay

## 2022-12-15 DIAGNOSIS — E785 Hyperlipidemia, unspecified: Secondary | ICD-10-CM | POA: Insufficient documentation

## 2022-12-15 DIAGNOSIS — Z5321 Procedure and treatment not carried out due to patient leaving prior to being seen by health care provider: Secondary | ICD-10-CM | POA: Insufficient documentation

## 2022-12-15 DIAGNOSIS — R109 Unspecified abdominal pain: Secondary | ICD-10-CM

## 2022-12-15 DIAGNOSIS — Z853 Personal history of malignant neoplasm of breast: Secondary | ICD-10-CM | POA: Diagnosis not present

## 2022-12-15 DIAGNOSIS — F17210 Nicotine dependence, cigarettes, uncomplicated: Secondary | ICD-10-CM | POA: Insufficient documentation

## 2022-12-15 DIAGNOSIS — Z96642 Presence of left artificial hip joint: Secondary | ICD-10-CM | POA: Diagnosis not present

## 2022-12-15 DIAGNOSIS — C569 Malignant neoplasm of unspecified ovary: Secondary | ICD-10-CM | POA: Diagnosis not present

## 2022-12-15 DIAGNOSIS — F419 Anxiety disorder, unspecified: Secondary | ICD-10-CM | POA: Insufficient documentation

## 2022-12-15 DIAGNOSIS — F32A Depression, unspecified: Secondary | ICD-10-CM | POA: Diagnosis not present

## 2022-12-15 DIAGNOSIS — R10811 Right upper quadrant abdominal tenderness: Secondary | ICD-10-CM | POA: Diagnosis present

## 2022-12-15 DIAGNOSIS — C786 Secondary malignant neoplasm of retroperitoneum and peritoneum: Secondary | ICD-10-CM | POA: Diagnosis not present

## 2022-12-15 DIAGNOSIS — R1013 Epigastric pain: Secondary | ICD-10-CM | POA: Diagnosis not present

## 2022-12-15 DIAGNOSIS — R188 Other ascites: Secondary | ICD-10-CM | POA: Diagnosis not present

## 2022-12-15 DIAGNOSIS — R1010 Upper abdominal pain, unspecified: Secondary | ICD-10-CM | POA: Diagnosis not present

## 2022-12-15 DIAGNOSIS — R10812 Left upper quadrant abdominal tenderness: Secondary | ICD-10-CM | POA: Insufficient documentation

## 2022-12-15 DIAGNOSIS — Z471 Aftercare following joint replacement surgery: Secondary | ICD-10-CM | POA: Diagnosis not present

## 2022-12-15 LAB — COMPREHENSIVE METABOLIC PANEL
ALT: 36 U/L (ref 0–44)
AST: 30 U/L (ref 15–41)
Albumin: 2.8 g/dL — ABNORMAL LOW (ref 3.5–5.0)
Alkaline Phosphatase: 265 U/L — ABNORMAL HIGH (ref 38–126)
Anion gap: 12 (ref 5–15)
BUN: 13 mg/dL (ref 6–20)
CO2: 27 mmol/L (ref 22–32)
Calcium: 9.1 mg/dL (ref 8.9–10.3)
Chloride: 97 mmol/L — ABNORMAL LOW (ref 98–111)
Creatinine, Ser: 0.63 mg/dL (ref 0.44–1.00)
GFR, Estimated: >60 mL/min (ref >60)
Glucose, Bld: 112 mg/dL — ABNORMAL HIGH (ref 70–99)
Potassium: 4.1 mmol/L (ref 3.5–5.1)
Sodium: 136 mmol/L (ref 135–145)
Total Bilirubin: 0.4 mg/dL (ref <1.2)
Total Protein: 6.6 g/dL (ref 6.5–8.1)

## 2022-12-15 LAB — CBC
HCT: 37.7 % (ref 36.0–46.0)
Hemoglobin: 12.6 g/dL (ref 12.0–15.0)
MCH: 38.1 pg — ABNORMAL HIGH (ref 26.0–34.0)
MCHC: 33.4 g/dL (ref 30.0–36.0)
MCV: 113.9 fL — ABNORMAL HIGH (ref 80.0–100.0)
Platelets: 492 10*3/uL — ABNORMAL HIGH (ref 150–400)
RBC: 3.31 MIL/uL — ABNORMAL LOW (ref 3.87–5.11)
RDW: 14.5 % (ref 11.5–15.5)
WBC: 9 10*3/uL (ref 4.0–10.5)
nRBC: 0 % (ref 0.0–0.2)

## 2022-12-15 LAB — URINALYSIS, ROUTINE W REFLEX MICROSCOPIC
Bilirubin Urine: NEGATIVE
Glucose, UA: NEGATIVE mg/dL
Ketones, ur: 5 mg/dL — AB
Leukocytes,Ua: NEGATIVE
Nitrite: NEGATIVE
Protein, ur: 30 mg/dL — AB
Specific Gravity, Urine: 1.031 — ABNORMAL HIGH (ref 1.005–1.030)
pH: 5 (ref 5.0–8.0)

## 2022-12-15 LAB — LIPASE, BLOOD: Lipase: 18 U/L (ref 11–51)

## 2022-12-15 MED ORDER — DIAZEPAM 5 MG PO TABS: 5.0000 mg | ORAL_TABLET | Freq: Every day | ORAL | Status: DC

## 2022-12-15 MED ORDER — FUROSEMIDE 20 MG PO TABS: 20.0000 mg | ORAL_TABLET | Freq: Every day | ORAL | Status: DC | PRN

## 2022-12-15 MED ORDER — IOHEXOL 350 MG/ML SOLN
125.0000 mL | Freq: Once | INTRAVENOUS | Status: AC | PRN
  Administered 2022-12-15: 125 mL via INTRAVENOUS

## 2022-12-15 MED ORDER — PANTOPRAZOLE SODIUM 40 MG PO TBEC: 40.0000 mg | DELAYED_RELEASE_TABLET | Freq: Every day | ORAL | Status: DC

## 2022-12-15 MED ORDER — HYDROMORPHONE HCL 2 MG/ML IJ SOLN: 4.0000 mg | Freq: Four times a day (QID) | INTRAMUSCULAR | Status: DC | PRN

## 2022-12-15 MED ORDER — ENOXAPARIN SODIUM 40 MG/0.4ML IJ SOSY: 40.0000 mg | PREFILLED_SYRINGE | INTRAMUSCULAR | Status: DC

## 2022-12-15 MED ORDER — FENTANYL CITRATE PF 50 MCG/ML IJ SOSY
25.0000 ug | PREFILLED_SYRINGE | Freq: Once | INTRAMUSCULAR | Status: AC
  Administered 2022-12-15: 25 ug via INTRAVENOUS
  Filled 2022-12-15: qty 1

## 2022-12-15 MED ORDER — HYDROMORPHONE HCL 1 MG/ML IJ SOLN
2.0000 mg | Freq: Once | INTRAMUSCULAR | Status: AC
  Administered 2022-12-15: 2 mg via INTRAVENOUS
  Filled 2022-12-15: qty 2

## 2022-12-15 MED ORDER — HYDROMORPHONE HCL 2 MG/ML IJ SOLN
4.0000 mg | Freq: Four times a day (QID) | INTRAMUSCULAR | Status: DC | PRN
Start: 2022-12-15 — End: 2022-12-15

## 2022-12-15 MED ORDER — VENLAFAXINE HCL ER 75 MG PO CP24
150.0000 mg | ORAL_CAPSULE | Freq: Every day | ORAL | Status: DC
  Administered 2022-12-15: 150 mg via ORAL
  Filled 2022-12-15 (×2): qty 2

## 2022-12-15 MED ORDER — GABAPENTIN 300 MG PO CAPS
300.0000 mg | ORAL_CAPSULE | Freq: Every day | ORAL | Status: DC
  Administered 2022-12-15: 300 mg via ORAL
  Filled 2022-12-15: qty 1

## 2022-12-15 MED ORDER — OXYCODONE HCL 5 MG PO TABS: 30.0000 mg | ORAL_TABLET | ORAL | Status: DC

## 2022-12-15 MED ORDER — OXYCODONE HCL 5 MG PO TABS
30.0000 mg | ORAL_TABLET | ORAL | Status: DC
  Administered 2022-12-15: 30 mg via ORAL
  Filled 2022-12-15: qty 6

## 2022-12-15 MED ORDER — ACETAMINOPHEN 650 MG RE SUPP: 650.0000 mg | Freq: Three times a day (TID) | RECTAL | Status: DC

## 2022-12-15 MED ORDER — OXYCODONE HCL 5 MG PO TABS: 15.0000 mg | ORAL_TABLET | ORAL | Status: DC | PRN

## 2022-12-15 MED ORDER — SODIUM CHLORIDE 0.9 % IV BOLUS
500.0000 mL | Freq: Once | INTRAVENOUS | Status: AC
  Administered 2022-12-15: 500 mL via INTRAVENOUS

## 2022-12-15 MED ORDER — ROSUVASTATIN CALCIUM 10 MG PO TABS: 5.0000 mg | ORAL_TABLET | Freq: Every day | ORAL | Status: DC

## 2022-12-15 MED ORDER — ACETAMINOPHEN 500 MG PO TABS: 1000.0000 mg | ORAL_TABLET | Freq: Three times a day (TID) | ORAL | Status: DC

## 2022-12-15 MED ORDER — OXYCODONE HCL 5 MG PO TABS: 30.0000 mg | ORAL_TABLET | ORAL | Status: DC | PRN

## 2022-12-15 NOTE — H&P (Signed)
History and Physical    Patient: Michelle Holt DOB: 1970-10-14 DOA: 12/15/2022 DOS: the patient was seen and examined on 12/15/2022 PCP: Ignatius Specking, MD  Patient coming from: Home  Chief Complaint:  Chief Complaint  Patient presents with   Abdominal Pain   HPI: Michelle Holt is a 52 y.o. female with medical history significant of recurrent ovarian cancer, splenic hematoma, hiatal hernia repair August 2024 who presents with abdominal pain.  Patient reports upper abdominal pain that began 4 days ago and has persisted since then.  She reports stretching/bandlike sensation in her upper abdomen.  She is unable to tell me alleviating or aggravating factors.  She denies any nausea, vomiting, chest pain, fevers, chills, diarrhea, urinary changes.  She reports having been diagnosed with ovarian cancer in 2020. She follows Dr. Ellin Saba with oncology. She underwent chemotherapy and had robotic assisted laparoscopic TAH and BSO and omentectomy on 12/24/2018.  She subsequently completed adjuvant chemotherapy.  In 2021, she entered remission.  In 2023, CT imaging showed progressive peritoneal metastasis along with mild enlargement of an isolated left external iliac lymph node.  Per oncology note, patient is to have biopsy of the peritoneal carcinomatosis to guide treatment.  ED course: Patient with stable vitals.  CBC stable.  CMP with hypoalbuminemia and mildly elevated alk phos, otherwise unremarkable.  Lipase normal.  Urinalysis does not show infection.  CT angio chest abdomen pelvis without evidence of thoracic or abdominal aortic dissection, does show 11 mm left common iliac lymph node and mildly increasing ascites along with increased peritoneal implants consistent with peritoneal carcinomatosis.  [facility] hospitalist asked to evaluate patient for admission given uncontrolled pain.   Review of Systems: As mentioned in the history of present illness. All other systems reviewed and are  negative. Past Medical History:  Diagnosis Date   Anemia    Anxiety and depression    Arthritis of facet joints at multiple vertebral levels    L5-S1   Constipation    Dyslipidemia    Family history of breast cancer    Family history of uterine cancer    GERD (gastroesophageal reflux disease)    History of hiatal hernia    History of kidney stones    Insomnia    Irritable bowel syndrome    Migraine    Muscle tension headache    Neuropathy of finger    Ovarian carcinoma (HCC)    ovarian   Plantar fasciitis of right foot    Port-A-Cath in place 08/20/2018   Past Surgical History:  Procedure Laterality Date   ABDOMINAL HYSTERECTOMY     BIOPSY  10/27/2020   Procedure: BIOPSY;  Surgeon: Dolores Frame, MD;  [facility];  Service: Gastroenterology;;   CHOLECYSTECTOMY  2008   COLONOSCOPY N/A 08/13/2013   Procedure: COLONOSCOPY;  Surgeon: Malissa Hippo, MD;  [facility];  Service: Endoscopy;  Laterality: N/A;  230-moved to 145 Ann to notify pt   COLONOSCOPY WITH PROPOFOL N/A 11/15/2020   Procedure: COLONOSCOPY WITH PROPOFOL;  Surgeon: Malissa Hippo, MD;  [facility];  Service: Endoscopy;  Laterality: N/A;  1:40   ESOPHAGEAL DILATION N/A 02/28/2021   Procedure: ESOPHAGEAL DILATION;  Surgeon: Malissa Hippo, MD;  [facility];  Service: Endoscopy;  Laterality: N/A;   ESOPHAGOGASTRODUODENOSCOPY     ESOPHAGOGASTRODUODENOSCOPY (EGD) WITH PROPOFOL N/A 07/20/2018   Procedure: ESOPHAGOGASTRODUODENOSCOPY (EGD) WITH PROPOFOL;  Surgeon: Malissa Hippo, MD;  [facility];  Service: Endoscopy;  Laterality: N/A;  Possible esophageal dilation.   ESOPHAGOGASTRODUODENOSCOPY (EGD) WITH PROPOFOL N/A 10/27/2020   Procedure: ESOPHAGOGASTRODUODENOSCOPY (EGD) WITH PROPOFOL;  Surgeon: Dolores Frame, MD;  [facility];  Service: Gastroenterology;  Laterality: N/A;  2:10, pt knows to arrive at 10:15    ESOPHAGOGASTRODUODENOSCOPY (EGD) WITH PROPOFOL N/A 02/28/2021   Procedure: ESOPHAGOGASTRODUODENOSCOPY (EGD) WITH PROPOFOL;  Surgeon: Malissa Hippo, MD;  [facility];  Service: Endoscopy;  Laterality: N/A;  200 ASA 1   ESOPHAGOGASTRODUODENOSCOPY (EGD) WITH PROPOFOL N/A 03/29/2022   Procedure: ESOPHAGOGASTRODUODENOSCOPY (EGD) WITH PROPOFOL;  Surgeon: Dolores Frame, MD;  [facility];  Service: Gastroenterology;  Laterality: N/A;  2:00 pm, asa 1-2   GIVENS CAPSULE STUDY N/A 11/24/2020   Procedure: GIVENS CAPSULE STUDY;  Surgeon: Malissa Hippo, MD;  [facility];  Service: Endoscopy;  Laterality: N/A;  7:30   IR ANGIOGRAM SELECTIVE EACH ADDITIONAL VESSEL  08/01/2018   IR ANGIOGRAM VISCERAL SELECTIVE  08/01/2018   IR EMBO ART  VEN HEMORR LYMPH EXTRAV  INC GUIDE ROADMAPPING  08/01/2018   IR IMAGING GUIDED PORT INSERTION  08/20/2018   IR PERC PLEURAL DRAIN W/INDWELL CATH W/IMG GUIDE  07/08/2018   IR THORACENTESIS ASP PLEURAL SPACE W/IMG GUIDE  07/07/2018   IR US GUIDE VASC ACCESS RIGHT  08/01/2018   PLEURAL EFFUSION DRAINAGE Left 07/13/2018   Procedure: DRAINAGE OF LOCULATED PLEURAL EFFUSION;  Surgeon: Kerin Perna, MD;  Location: [facility] OR;  Service: Thoracic;  Laterality: Left;   POLYPECTOMY  11/15/2020   Procedure: POLYPECTOMY;  Surgeon: Malissa Hippo, MD;  [facility];  Service: Endoscopy;;   REMOVAL OF PLEURAL DRAINAGE CATHETER Left 08/20/2018   Procedure: REMOVAL OF PLEURAL DRAINAGE CATHETER;  Surgeon: Kerin Perna, MD;  Location: [facility] OR;  Service: Thoracic;  Laterality: Left;   REMOVAL OF PLEURAL DRAINAGE CATHETER Left 08/20/2018   Procedure: REMOVAL OF PLEURAL DRAINAGE CATHETER;  Surgeon: Kerin Perna, MD;  Location: [facility] OR;  Service: Thoracic;  Laterality: Left;   TALC PLEURODESIS Left 07/13/2018   Procedure: Talc Pleuradesis;  Surgeon: Donata Clay, Theron Arista, MD;  Location: [facility] OR;  Service: Thoracic;  Laterality: Left;   TOTAL HIP  ARTHROPLASTY Left 06/05/2020   Procedure: LEFT TOTAL HIP ARTHROPLASTY ANTERIOR APPROACH;  Surgeon: Tarry Kos, MD;  Location: [facility] OR;  Service: Orthopedics;  Laterality: Left;  3-C   TUBAL LIGATION Bilateral    UTERINE ABLATION     VIDEO ASSISTED THORACOSCOPY Left 07/13/2018   Procedure: VIDEO ASSISTED THORACOSCOPY;  Surgeon: Kerin Perna, MD;  Location: [facility] OR;  Service: Thoracic;  Laterality: Left;   Social History:  reports that she has been smoking cigarettes. She started smoking about 21 years ago. She has a 8.5 pack-year smoking history. She has never used smokeless tobacco. She reports that she does not currently use alcohol after a past usage of about 1.0 standard drink of alcohol per week. She reports that she does not use drugs.  Allergies  Allergen Reactions   Morphine And Codeine Itching   Nickel Itching   Nortriptyline Other (See Comments)    Significant weight gain   Topamax [Topiramate] Diarrhea and Nausea Only   Xanax [Alprazolam] Other (See Comments)    Can't wake up    Actifed Cold-Allergy [Chlorpheniramine-Phenyleph Er] Rash    Red dye only   Amoxicillin Rash   Codeine Hives   Erythromycin Rash   Penicillins Rash  Sudafed [Pseudoephedrine Hcl] Rash    Red dye only    Family History  Problem Relation Age of Onset   Depression Mother    Hypertension Mother    Obesity Mother    Diabetes Mother    Kidney disease Mother    Peripheral vascular disease Father    Atrial fibrillation Father    Crohn's disease Sister    Uterine cancer Sister 49       maternal half sister   COPD Brother    Osteoporosis Brother    Breast cancer Maternal Aunt 67   Colon cancer Neg Hx     Prior to Admission medications   Medication Sig Start Date End Date Taking? Authorizing Provider  albuterol (VENTOLIN HFA) 108 (90 Base) MCG/ACT inhaler Inhale 2 puffs into the lungs every 6 (six) hours as needed for wheezing or shortness of breath.   Yes [provider]   Bioflavonoid Products (VITAMIN C) CHEW Chew 1 tablet by mouth daily.   Yes [provider]  Cholecalciferol (VITAMIN D) 50 MCG (2000 UT) CAPS Take 2,000 Units by mouth daily.   Yes [provider]  Cyanocobalamin (B-12) 3000 MCG CAPS Take 3,000 mcg by mouth every other day.   Yes [provider]  diazepam (VALIUM) 5 MG tablet Take 1 tablet (5 mg total) by mouth at bedtime. 09/05/22  Yes Doreatha Massed, MD  docusate sodium (COLACE) 250 MG capsule Take 250 mg by mouth 2 (two) times daily.   Yes [provider]  furosemide (LASIX) 20 MG tablet Take 1 tablet (20 mg total) by mouth daily as needed. Patient taking differently: Take 20 mg by mouth daily as needed for fluid or edema. 12/02/22  Yes Doreatha Massed, MD  gabapentin (NEURONTIN) 100 MG capsule Take 3 capsules (300 mg total) by mouth daily. TAKE THREE (3) CAPSULES BY MOUTH AT BEDTIME 09/02/22  Yes Doreatha Massed, MD  LORazepam (ATIVAN) 0.5 MG tablet Take 0.5 mg by mouth 3 (three) times daily as needed for anxiety. 07/04/21  Yes [provider]  magnesium oxide (MAG-OX) 400 (240 Mg) MG tablet Take 1 tablet (400 mg total) by mouth 2 (two) times daily. 10/09/21  Yes Pennington, Rebekah M, PA-C  omeprazole (PRILOSEC) 40 MG capsule TAKE ONE CAPSULE BY MOUTH TWICE DAILY BEFORE MEALS Patient taking differently: Take 40 mg by mouth daily. 04/29/22  Yes Dolores Frame, MD  ondansetron (ZOFRAN) 4 MG tablet Take 1 tablet (4 mg total) by mouth every 8 (eight) hours as needed for nausea or vomiting. 08/20/22  Yes Doreatha Massed, MD  oxycodone (ROXICODONE) 30 MG immediate release tablet Take 30 mg by mouth every 4 (four) hours as needed for pain. 12/10/22  Yes [provider]  rosuvastatin (CRESTOR) 5 MG tablet Take 5 mg by mouth daily. 10/05/22  Yes [provider]  venlafaxine XR (EFFEXOR-XR) 150 MG 24 hr capsule Take 150 mg by mouth daily. 10/03/22  Yes [provider]    Physical Exam: Vitals:   12/15/22 1600 12/15/22 1745 12/15/22 1920 12/15/22 2025  BP: 107/72 115/80 (!) 106/55   Pulse: 85 93 80   Resp:  (!) 25 14   Temp:      SpO2: 98% 97% 97%   Weight:    86.8 kg  Height:    5\' 4"  (1.626 m)   Physical Exam Constitutional:      Appearance: She is obese. She is not ill-appearing.  HENT:     Head: Normocephalic and atraumatic.  Mouth/Throat:     Mouth: Mucous membranes are moist.     Pharynx: Oropharynx is clear. No oropharyngeal exudate.  Eyes:     Extraocular Movements: Extraocular movements intact.     Pupils: Pupils are equal, round, and reactive to light.  Cardiovascular:     Rate and Rhythm: Normal rate and regular rhythm.     Heart sounds: Normal heart sounds. No murmur heard.    No friction rub. No gallop.  Pulmonary:     Effort: Pulmonary effort is normal.     Breath sounds: Normal breath sounds. No wheezing, rhonchi or rales.  Abdominal:     General: Abdomen is protuberant. Bowel sounds are normal.     Tenderness: There is abdominal tenderness in the right upper quadrant, epigastric area and left upper quadrant. There is no guarding or rebound.     Comments: Mildly distended abdomen, TTP in upper quadrants.   Musculoskeletal:        General: Normal range of motion.  Skin:    General: Skin is warm and dry.  Neurological:     General: No focal deficit present.     Mental Status: She is alert and oriented to person, place, and time.  Psychiatric:        Mood and Affect: Mood normal.        Behavior: Behavior normal.     Data Reviewed:  There are no new results to review at this time.  Assessment and Plan: No notes have been filed under this hospital service. Service: Hospitalist  Recurrent metastatic ovarian cancer with peritoneal carcinomatosis Abdominal pain Patient presenting with several days of abdominal pain primarily located in the upper abdomen.  Workup thus far is consistent with  progression of underlying cancer.  This is likely the etiology of her uncontrolled pain.  Her primary reason for admission is pain control.  She has been taking oxycodone 30 mg every 4 hours at home without relief of her pain.  Discussed with oncology, will start patient on Dilaudid along with home oxycodone for pain control.  In regards to her underlying cancer, she is scheduled to obtain a biopsy to guide treatment later this week.  She will then need to see Dr. Ellin Saba to determine next course of action.  Can also consider palliative care consult tomorrow morning given diagnosis. -Pain control as follows: Scheduled Tylenol every 8 hours, scheduled oxycodone 30 mg every 4 hours, IV Dilaudid 4 mg every 6 hours as needed for severe pain -Oncology consulted, appreciate assistance, Dr. Ellin Saba to see patient tomorrow -Consider palliative care consult tomorrow a.m.  GERD -Continue home PPI  Hyperlipidemia -Continue home Crestor  Depression and anxiety -Continue home venlafaxine -Holding home diazepam while providing IV pain control, can consider restarting tomorrow if needed    Advance Care Planning:   Code Status: Limited: Do not attempt resuscitation (DNR) -DNR-LIMITED -Do Not Intubate/DNI  Confirmed with patient at bedside.  Consults: oncology  Family Communication: updated daughter at bedside  Severity of Illness: The appropriate patient status for this patient is OBSERVATION. Observation status is judged to be reasonable and necessary in order to provide the required intensity of service to ensure the patient's safety. The patient's presenting symptoms, physical exam findings, and initial radiographic and laboratory data in the context of their medical condition is felt to place them at decreased risk for further clinical deterioration. Furthermore, it is anticipated that the patient will be medically stable for discharge from the hospital within 2 midnights of admission.  Portions  of this note were generated with Scientist, clinical (histocompatibility and immunogenetics). Dictation errors may occur despite best attempts at proofreading.  Author: Briscoe Burns, MD 12/15/2022 9:36 PM  For on call review [email or web address].

## 2022-12-15 NOTE — ED Provider Notes (Signed)
[place] EMERGENCY DEPARTMENT AT [facility] Provider Note   CSN: [id number] Arrival date & time: 12/15/22  1434     History  Chief Complaint  Patient presents with   Abdominal Pain    Michelle Holt is a 52 y.o. female.  With a past history of recurrent ovarian cancer, hiatal hernia repair August 2024), and splenic hematoma who presents to the ED for abdominal pain.  Epigastric abdominal pain began 4 days ago and has persisted since the onset.  Patient also reports a fullness sensation throughout her abdomen and shortness of breath.  Denies chest pain, fevers, chills, nausea, vomiting and changes in bowel habits.  Recently diagnosed recurrent ovarian cancer with metastases through the peritoneum.  She has not yet started her first round of chemotherapy.  She is followed by Dr.Katragadda with [facility] hematology/oncology.  No current anticoagulation   Abdominal Pain      Home Medications Prior to Admission medications   Medication Sig Start Date End Date Taking? Authorizing Provider  albuterol (VENTOLIN HFA) 108 (90 Base) MCG/ACT inhaler Inhale 2 puffs into the lungs every 6 (six) hours as needed for wheezing or shortness of breath.    [provider]  B Complex-C (B-COMPLEX WITH VITAMIN C) tablet Take 1 tablet by mouth daily.    [provider]  Bioflavonoid Products (VITAMIN C) CHEW Chew 1 tablet by mouth daily.    [provider]  Cholecalciferol (VITAMIN D) 50 MCG (2000 UT) CAPS Take 2,000 Units by mouth daily.    [provider]  Cyanocobalamin (B-12) 3000 MCG CAPS Take 3,000 mcg by mouth every other day.    [provider]  diazepam (VALIUM) 5 MG tablet Take 1 tablet (5 mg total) by mouth at bedtime. 09/05/22   Doreatha Massed, MD  dicyclomine (BENTYL) 10 MG capsule Take 1 capsule (10 mg total) by mouth 3 (three) times daily as needed for spasms. 03/05/21   Doreatha Massed, MD  docusate sodium (COLACE) 250 MG capsule  Take 250 mg by mouth 2 (two) times daily.    [provider]  folic acid (FOLVITE) 1 MG tablet Take 1 tablet (1 mg total) by mouth daily. 06/27/21   Meredeth Ide, MD  folic acid (FOLVITE) 400 MCG tablet Take 400 mcg by mouth daily.    [provider]  furosemide (LASIX) 20 MG tablet Take 1 tablet (20 mg total) by mouth daily as needed. 12/02/22   Doreatha Massed, MD  gabapentin (NEURONTIN) 100 MG capsule Take 3 capsules (300 mg total) by mouth daily. TAKE THREE (3) CAPSULES BY MOUTH AT BEDTIME 09/02/22   Doreatha Massed, MD  linaclotide [facility]) 290 MCG CAPS capsule Take 1 capsule (290 mcg total) by mouth daily before breakfast. 05/09/21   Doreatha Massed, MD  loratadine (CLARITIN) 10 MG tablet Take 10 mg by mouth daily as needed for allergies.    [provider]  LORazepam (ATIVAN) 0.5 MG tablet Take 0.5 mg by mouth 3 (three) times daily as needed for anxiety. 07/04/21   [provider]  magic mouthwash w/lidocaine SOLN Take 5 mLs by mouth 3 (three) times daily as needed for mouth pain (Swish and spit). 05/30/21   Doreatha Massed, MD  magnesium oxide (MAG-OX) 400 (240 Mg) MG tablet Take 1 tablet (400 mg total) by mouth 2 (two) times daily. 10/09/21   Carnella Guadalajara, PA-C  olaparib (LYNPARZA) 150 MG tablet Take 2 tablets (300 mg total) by mouth 2 (two) times daily. Swallow  whole. May take with food to decrease nausea and vomiting. 10/28/22   Doreatha Massed, MD  omeprazole (PRILOSEC) 40 MG capsule TAKE ONE CAPSULE BY MOUTH TWICE DAILY BEFORE MEALS 04/29/22   Marguerita Merles, Reuel Boom, MD  ondansetron (ZOFRAN) 4 MG tablet Take 1 tablet (4 mg total) by mouth every 8 (eight) hours as needed for nausea or vomiting. 08/20/22   Doreatha Massed, MD  oxyCODONE HCl 30 MG TABA Take 30 mg by mouth every 4 (four) hours as needed. 12/09/22   Cindie Crumbly, MD  prochlorperazine (COMPAZINE) 10 MG tablet Take 1 tablet (10 mg total) by mouth 2 (two)  times daily as needed for nausea or vomiting. Take 30 minutes before Lynparza twice daily. 08/20/22   Doreatha Massed, MD  promethazine (PHENERGAN) 25 MG tablet Take 1 tablet (25 mg total) by mouth every 6 (six) hours as needed for nausea or vomiting. 08/20/22   Doreatha Massed, MD  rivaroxaban (XARELTO) 10 MG TABS tablet Take 1 tablet (10 mg total) by mouth daily. 05/07/22   Tarry Kos, MD  rosuvastatin (CRESTOR) 5 MG tablet Take 5 mg by mouth daily. 10/05/22   [provider]  SUMAtriptan (IMITREX) 100 MG tablet TAKE ONE TABLET BY MOUTH PRN UP to TWICE DAILY AS NEEDED. Patient taking differently: Take 100 mg by mouth 2 (two) times daily as needed for migraine. 08/12/18   Glean Salvo, NP  triamcinolone cream (KENALOG) 0.1 % Apply 1 application topically 2 (two) times daily as needed (rash). 06/28/20   Doreatha Massed, MD  venlafaxine XR (EFFEXOR-XR) 150 MG 24 hr capsule Take 150 mg by mouth daily. 10/03/22   [provider]      Allergies    Morphine and codeine, Nickel, Nortriptyline, Topamax [topiramate], Xanax [alprazolam], Actifed cold-allergy [chlorpheniramine-phenyleph er], Amoxicillin, Codeine, Erythromycin, Penicillins, and Sudafed [pseudoephedrine hcl]    Review of Systems   Review of Systems  Gastrointestinal:  Positive for abdominal pain.    Physical Exam Updated Vital Signs BP 115/80   Pulse 93   Temp 97.6 F (36.4 C)   Resp (!) 25   Ht 5\' 4"  (1.626 m)   Wt 81.6 kg   LMP  (LMP Unknown)   SpO2 97%   BMI 30.90 kg/m  Physical Exam Vitals and nursing note reviewed.  HENT:     Head: Normocephalic and atraumatic.  Eyes:     Pupils: Pupils are equal, round, and reactive to light.  Cardiovascular:     Rate and Rhythm: Normal rate and regular rhythm.  Pulmonary:     Effort: Pulmonary effort is normal.     Breath sounds: Normal breath sounds.  Abdominal:     Palpations: Abdomen is soft.     Tenderness: There is abdominal tenderness.      Comments: Mild diffuse abdominal tenderness most prevalent in epigastric region without rebound rigidity or guarding  Skin:    General: Skin is warm and dry.  Neurological:     Mental Status: She is alert.  Psychiatric:        Mood and Affect: Mood normal.     ED Results / Procedures / Treatments   Labs (all labs ordered are listed, but only abnormal results are displayed) Labs Reviewed  COMPREHENSIVE METABOLIC PANEL - Abnormal; Notable for the following components:      Result Value   Chloride 97 (*)    Glucose, Bld 112 (*)    Albumin 2.8 (*)    Alkaline Phosphatase 265 (*)  All other components within normal limits  CBC - Abnormal; Notable for the following components:   RBC 3.31 (*)    MCV 113.9 (*)    MCH 38.1 (*)    Platelets 492 (*)    All other components within normal limits  URINALYSIS, ROUTINE W REFLEX MICROSCOPIC - Abnormal; Notable for the following components:   Color, Urine AMBER (*)    APPearance HAZY (*)    Specific Gravity, Urine 1.031 (*)    Hgb urine dipstick MODERATE (*)    Ketones, ur 5 (*)    Protein, ur 30 (*)    Bacteria, UA RARE (*)    All other components within normal limits  LIPASE, BLOOD    EKG None  Radiology CT Angio Chest/Abd/Pel for Dissection W and/or Wo Contrast  Result Date: 12/15/2022 CLINICAL DATA:  Upper abdominal pain for 1 week. History of ovarian cancer. EXAM: CT ANGIOGRAPHY CHEST, ABDOMEN AND PELVIS TECHNIQUE: Non-contrast CT of the chest was initially obtained. Multidetector CT imaging through the chest, abdomen and pelvis was performed using the standard protocol during bolus administration of intravenous contrast. Multiplanar reconstructed images and MIPs were obtained and reviewed to evaluate the vascular anatomy. RADIATION DOSE REDUCTION: This exam was performed according to the departmental dose-optimization program which includes automated exposure control, adjustment of the mA and/or kV according to patient size and/or  use of iterative reconstruction technique. CONTRAST:  OMNIPAQUE IOHEXOL 350 MG/ML SOLN COMPARISON:  November 22, 2022. FINDINGS: CTA CHEST FINDINGS Cardiovascular: Preferential opacification of the thoracic aorta. No evidence of thoracic aortic aneurysm or dissection. Normal heart size. No pericardial effusion. Mediastinum/Nodes: Small hiatal hernia. No adenopathy. Thyroid gland is unremarkable. Lungs/Pleura: No pneumothorax or pleural effusion is noted. Minimal subsegmental atelectasis is noted posteriorly in right upper lobe. Left lung is clear. Musculoskeletal: No chest wall abnormality. No acute or significant osseous findings. Review of the MIP images confirms the above findings. CTA ABDOMEN AND PELVIS FINDINGS VASCULAR Aorta: Normal caliber aorta without aneurysm, dissection, vasculitis or significant stenosis. Celiac: Patent without evidence of aneurysm, dissection, vasculitis or significant stenosis. SMA: Patent without evidence of aneurysm, dissection, vasculitis or significant stenosis. Renals: Both renal arteries are patent without evidence of aneurysm, dissection, vasculitis, fibromuscular dysplasia or significant stenosis. IMA: Patent without evidence of aneurysm, dissection, vasculitis or significant stenosis. Inflow: Patent without evidence of aneurysm, dissection, vasculitis or significant stenosis. Veins: No obvious venous abnormality within the limitations of this arterial phase study. Review of the MIP images confirms the above findings. NON-VASCULAR Hepatobiliary: No focal liver abnormality is seen. Status post cholecystectomy. No biliary dilatation. Pancreas: Unremarkable. No pancreatic ductal dilatation or surrounding inflammatory changes. Spleen: Stable appearance of cystic exophytic splenic lesion with internal calcifications. Adrenals/Urinary Tract: Adrenal glands are unremarkable. Kidneys are normal, without renal calculi, focal lesion, or hydronephrosis. Bladder is unremarkable.  Stomach/Bowel: Stomach is unremarkable. There is no evidence of bowel obstruction or inflammation. Lymphatic: 11 mm left common iliac lymph node is noted concerning for metastatic disease. Reproductive: Status post hysterectomy. Other: Mild ascites is noted which is increased compared to prior exam. Increased peritoneal implants are noted compared to prior exam consistent with peritoneal carcinomatosis. Musculoskeletal: Status post left total hip arthroplasty. No acute osseous abnormality is noted. Review of the MIP images confirms the above findings. IMPRESSION: No evidence of thoracic or abdominal aortic dissection or aneurysm. Small hiatal hernia. Stable appearance of cystic exophytic splenic lesion with internal calcifications of unknown etiology. 11 mm left common iliac lymph node is noted concerning  for metastatic disease. Mild ascites is noted which is increased compared to prior exam, with increased peritoneal implants noted diffusely consistent with peritoneal carcinomatosis. Electronically Signed   By: Lupita Raider M.D.   On: 12/15/2022 17:33    Procedures Procedures    Medications Ordered in ED Medications  sodium chloride 0.9 % bolus 500 mL (500 mLs Intravenous New Bag/Given 12/15/22 1602)  HYDROmorphone (DILAUDID) injection 2 mg (2 mg Intravenous Given 12/15/22 1603)  iohexol (OMNIPAQUE) 350 MG/ML injection 125 mL (125 mLs Intravenous Contrast Given 12/15/22 1629)  HYDROmorphone (DILAUDID) injection 2 mg (2 mg Intravenous Given 12/15/22 1729)    ED Course/ Medical Decision Making/ A&P Clinical Course as of 12/15/22 1808  Sun Dec 15, 2022  1804 Urine heavily contaminated.  Low suspicion for UTI but will send for culture.  No leukocytosis.  Metabolic panel notable for elevation in alk phos otherwise unremarkable.  CT abdomen pelvis showsFindings most consistent with known peritoneal carcinomatosis as well as an 11 mm left common iliac lymph node also concerning for metastatic disease.   No evidence of lung pathology.  Stable appearance of cystic lesion of her spleen.  Informed patient and her daughter at bedside of these findings.  Patient is still having a good deal of discomfort following 2 doses of 2 mg Dilaudid.  She will need admission for pain control.  Paged hospitalist [MP]    Clinical Course User Index [MP] Royanne Foots, DO                                 Medical Decision Making 52 year old female with history as above including recurrent metastatic ovarian cancer presenting for 4 days of abdominal pain and shortness of breath.  Has not yet started chemotherapy.  Hernia repair in August.  History of chronic splenic hematoma following a left chest tube back in 2020.  Initial vital signs notable for hypoxemia with oxygen saturation in the low 90s on room air.  Significant abdominal tenderness diffusely most prominent over the epigastric region on my exam.  Presentation most concerning for intra-abdominal infection, intra-abdominal hemorrhage in the setting of known splenic hematoma, complication from hernia repair and potential pulmonary embolism given hypoxemia and known metastatic ovarian cancer.  She is not anticoagulated.  Initiated supplemental oxygen with 3 L nasal cannula which improved her work of breathing.  She has been taking 30 mg oxycodone at home with no relief in her pain.  Will provide her with 2 mg IV Dilaudid and reevaluate  Will obtain laboratory workup including metabolic panel, CBC, lipase, UA as well as a CTA of the chest abdomen pelvis.  Admission likely.  Amount and/or Complexity of Data Reviewed Labs: ordered. Radiology: ordered.  Risk Prescription drug management. Decision regarding hospitalization.           Final Clinical Impression(s) / ED Diagnoses Final diagnoses:  Malignant neoplasm of ovary, unspecified laterality (HCC)  Peritoneal carcinomatosis (HCC)  Abdominal pain, unspecified abdominal location    Rx / DC  Orders ED Discharge Orders     None         Royanne Foots, DO 12/15/22 1808

## 2022-12-15 NOTE — ED Triage Notes (Signed)
Pt reports upper abd pain x 1 week.  Pt has hx of hiatal hernia surgery a few months ago, hematoma on her spleen from a laceration during a chest tube placement and recently found out she has stage 4 ovarian cancer for the second time since 2020.

## 2022-12-15 NOTE — ED Notes (Signed)
Oncology paged at this time. Misty Stanley

## 2022-12-15 NOTE — Hospital Course (Addendum)
Ovarian cancer recently recurred with mets Peritoneal caricinomatosis Dr. Ella Jubilee   4 days of abdominal pain Diffuse, mostly upper abdomen  High doses of oxycodone -pain still uncontrolled  Fluid overload from cancer? Hypoxic - low 90s   Initially diagnosed in 2020 Recurred  Biopsy per oncology to guide treatment  Oxycodone not touching pain Upper abdom pain began Wednesday  Oncology consulted Call palliative consult tomorrow AM Pain control -tylenol q8 -oxy q4 standing -dilaudid 4mg  q6 prn for severe pain

## 2022-12-16 ENCOUNTER — Other Ambulatory Visit: Payer: Self-pay | Admitting: *Deleted

## 2022-12-16 ENCOUNTER — Telehealth: Payer: Self-pay

## 2022-12-16 ENCOUNTER — Encounter: Payer: Self-pay | Admitting: Hematology

## 2022-12-16 DIAGNOSIS — R52 Pain, unspecified: Secondary | ICD-10-CM

## 2022-12-16 DIAGNOSIS — C786 Secondary malignant neoplasm of retroperitoneum and peritoneum: Secondary | ICD-10-CM | POA: Diagnosis not present

## 2022-12-16 MED ORDER — HYDROMORPHONE 1 MG/ML IV SOLN
INTRAVENOUS | Status: DC
  Administered 2022-12-16: 30 mg via INTRAVENOUS
  Filled 2022-12-16: qty 30

## 2022-12-16 MED ORDER — NALOXONE HCL 0.4 MG/ML IJ SOLN: 0.4000 mg | INTRAMUSCULAR | Status: DC | PRN

## 2022-12-16 MED ORDER — SODIUM CHLORIDE 0.9% FLUSH: 9.0000 mL | INTRAVENOUS | Status: DC | PRN

## 2022-12-16 MED ORDER — ONDANSETRON HCL 4 MG/2ML IJ SOLN: 4.0000 mg | Freq: Four times a day (QID) | INTRAMUSCULAR | Status: DC | PRN

## 2022-12-16 MED ORDER — OXYCODONE HCL ER 60 MG PO T12A: 60.0000 mg | EXTENDED_RELEASE_TABLET | Freq: Two times a day (BID) | ORAL | 0 refills | Status: DC

## 2022-12-16 MED ORDER — KETOROLAC TROMETHAMINE 30 MG/ML IJ SOLN: 30.0000 mg | Freq: Four times a day (QID) | INTRAMUSCULAR | Status: DC | PRN

## 2022-12-16 MED ORDER — DIPHENHYDRAMINE HCL 12.5 MG/5ML PO ELIX: 12.5000 mg | ORAL_SOLUTION | Freq: Four times a day (QID) | ORAL | Status: DC | PRN

## 2022-12-16 MED ORDER — DIPHENHYDRAMINE HCL 50 MG/ML IJ SOLN: 12.5000 mg | Freq: Four times a day (QID) | INTRAMUSCULAR | Status: DC | PRN

## 2022-12-16 MED ORDER — HYDROMORPHONE HCL 1 MG/ML IJ SOLN: 4.0000 mg | Freq: Four times a day (QID) | INTRAMUSCULAR | Status: DC | PRN

## 2022-12-16 NOTE — Progress Notes (Addendum)
Progress Note  Patient with history of ovarian cancer status post chemotherapy, robotic assisted laparoscopy TAH and BSO and omentectomy on 12/24/2018 with completion of adjuvant chemotherapy with remission in 2021, peritoneal metastasis on CT imaging 2023 who presents to the emergency department due to uncontrolled pain. CT angiography of chest abdomen and pelvis done in the ED showed peritoneal carcinomatosis. Patient takes oxycodone 30 mg every 4 hours as needed  Patient was treated with IV Dilaudid 2 mg x 2 and IV fentanyl at 25 mg were given in the ED.  Patient complained of uncontrolled pain on arrival to the floor. Patient was started on IV Dilaudid via PCA pump and IV Toradol 30 mg every 6 hours as needed.  We shall continue to monitor patient for better pain control and titrate as needed. Patient will be transitioned to oral dose equivalent prior to discharge and she will need to follow-up with pain clinic for better control of pain on discharge.   Total time:  53 minutes This includes time reviewing the chart including progress notes, labs, EKGs, taking medical decisions, ordering labs and documenting findings.

## 2022-12-16 NOTE — Final Progress Note (Signed)
RN called that patient left AMA   Patient has demonstrated the ability to understand her medical condition(s) which include pain from peritoneal carcinomatosis.  She has demonstrated the ability to appreciate how treatment for her pain will be beneficial.   Patient has also demonstrated the ability to understand and appreciate how refusal of treatement for recurrent metastatic ovarian cancer with peritoneal carcinomatosis could result in harm, repeat hospitalization, and possibly death.  Patient demonstrates the ability to reason through the risks and benefits of the proposed treatment.  Finally, she is able to clearly communicate her choice.

## 2022-12-16 NOTE — Progress Notes (Signed)
Patient arrived to the floor for pain control. She had stated that she had been hurting since Wednesday.  In the ER she was given a total of 4mg  of dilaudid and of fentanyl. She refused Tylenol when she got to the floor but took oxycodone 30mg  at 2200. Patient still stated he pain was 20 out of 10 and that she was not getting any relief. I spoke with the doctor about a PCA pump so that she could have better control over her pain. It was ordered. The patient was not happy with the dose that was ordered. I spoke with the doctor again about the patient's feelings about pain management. He ordered Toradol in addition to the PCA pump. She made her decision to go home. I asked her did she have something to take for pain at home and she stated she had oxy at home but she was having to take more than what is ordered to manage the pain. Port was flushed and deaccessed. AMA papers signed. Patient and daughter escourted outside.

## 2022-12-16 NOTE — Telephone Encounter (Signed)
Notified Patient of prior authorization approval of Oxycontin 60 mg Tablets. Medication is approved through 01/14/2024. Pharmacy will order medication. No other needs or concerns voiced at this time.

## 2022-12-17 ENCOUNTER — Other Ambulatory Visit: Payer: Self-pay

## 2022-12-18 ENCOUNTER — Other Ambulatory Visit: Payer: Self-pay | Admitting: Radiology

## 2022-12-18 ENCOUNTER — Other Ambulatory Visit: Payer: Self-pay

## 2022-12-18 ENCOUNTER — Other Ambulatory Visit: Payer: Self-pay | Admitting: *Deleted

## 2022-12-18 DIAGNOSIS — C569 Malignant neoplasm of unspecified ovary: Secondary | ICD-10-CM

## 2022-12-18 MED ORDER — LACTULOSE 20 GM/30ML PO SOLN: ORAL | 1 refills | Status: DC

## 2022-12-19 ENCOUNTER — Telehealth: Payer: Self-pay | Admitting: *Deleted

## 2022-12-19 ENCOUNTER — Other Ambulatory Visit: Payer: Self-pay | Admitting: *Deleted

## 2022-12-19 ENCOUNTER — Ambulatory Visit (HOSPITAL_COMMUNITY)
Admission: RE | Admit: 2022-12-19 | Discharge: 2022-12-19 | Disposition: A | Discharge status: Home / Self Care | Payer: 59 | Source: Ambulatory Visit | Attending: Hematology | Admitting: Hematology

## 2022-12-19 NOTE — Telephone Encounter (Signed)
Called to follow up on pain management.  Patient states that she has finally gotten some relief from the new regimen.  Was able to take a shower for first time in 4 days.

## 2022-12-20 ENCOUNTER — Other Ambulatory Visit: Payer: Self-pay

## 2022-12-23 ENCOUNTER — Other Ambulatory Visit (HOSPITAL_COMMUNITY): Payer: Self-pay

## 2022-12-23 ENCOUNTER — Other Ambulatory Visit (HOSPITAL_COMMUNITY): Payer: Self-pay | Admitting: Pharmacy Technician

## 2022-12-23 NOTE — Progress Notes (Signed)
Disenrolled patient no longer on the medication. Due to cancer came back.

## 2022-12-24 ENCOUNTER — Encounter (HOSPITAL_COMMUNITY): Payer: Self-pay

## 2022-12-24 ENCOUNTER — Other Ambulatory Visit: Payer: Self-pay

## 2022-12-24 ENCOUNTER — Inpatient Hospital Stay (HOSPITAL_COMMUNITY)
Admission: EM | Admit: 2022-12-24 | Discharge: 2022-12-30 | DRG: 755 | Disposition: A | Discharge status: Home / Self Care | Payer: 59 | Attending: Family Medicine | Admitting: Family Medicine

## 2022-12-24 ENCOUNTER — Emergency Department (HOSPITAL_COMMUNITY): Payer: 59

## 2022-12-24 DIAGNOSIS — Z515 Encounter for palliative care: Secondary | ICD-10-CM

## 2022-12-24 DIAGNOSIS — Z888 Allergy status to other drugs, medicaments and biological substances status: Secondary | ICD-10-CM | POA: Diagnosis not present

## 2022-12-24 DIAGNOSIS — Z8049 Family history of malignant neoplasm of other genital organs: Secondary | ICD-10-CM

## 2022-12-24 DIAGNOSIS — K449 Diaphragmatic hernia without obstruction or gangrene: Secondary | ICD-10-CM | POA: Diagnosis not present

## 2022-12-24 DIAGNOSIS — E785 Hyperlipidemia, unspecified: Secondary | ICD-10-CM | POA: Diagnosis present

## 2022-12-24 DIAGNOSIS — C786 Secondary malignant neoplasm of retroperitoneum and peritoneum: Secondary | ICD-10-CM | POA: Diagnosis present

## 2022-12-24 DIAGNOSIS — E222 Syndrome of inappropriate secretion of antidiuretic hormone: Secondary | ICD-10-CM | POA: Diagnosis not present

## 2022-12-24 DIAGNOSIS — Z90710 Acquired absence of both cervix and uterus: Secondary | ICD-10-CM

## 2022-12-24 DIAGNOSIS — Z8249 Family history of ischemic heart disease and other diseases of the circulatory system: Secondary | ICD-10-CM | POA: Diagnosis not present

## 2022-12-24 DIAGNOSIS — Z800 Family history of malignant neoplasm of digestive organs: Secondary | ICD-10-CM | POA: Diagnosis not present

## 2022-12-24 DIAGNOSIS — Z9109 Other allergy status, other than to drugs and biological substances: Secondary | ICD-10-CM | POA: Diagnosis not present

## 2022-12-24 DIAGNOSIS — Z881 Allergy status to other antibiotic agents status: Secondary | ICD-10-CM | POA: Diagnosis not present

## 2022-12-24 DIAGNOSIS — Z87442 Personal history of urinary calculi: Secondary | ICD-10-CM

## 2022-12-24 DIAGNOSIS — K219 Gastro-esophageal reflux disease without esophagitis: Secondary | ICD-10-CM | POA: Diagnosis present

## 2022-12-24 DIAGNOSIS — Z885 Allergy status to narcotic agent status: Secondary | ICD-10-CM | POA: Diagnosis not present

## 2022-12-24 DIAGNOSIS — Z833 Family history of diabetes mellitus: Secondary | ICD-10-CM | POA: Diagnosis not present

## 2022-12-24 DIAGNOSIS — Z8543 Personal history of malignant neoplasm of ovary: Secondary | ICD-10-CM

## 2022-12-24 DIAGNOSIS — Z9049 Acquired absence of other specified parts of digestive tract: Secondary | ICD-10-CM

## 2022-12-24 DIAGNOSIS — C569 Malignant neoplasm of unspecified ovary: Secondary | ICD-10-CM | POA: Diagnosis not present

## 2022-12-24 DIAGNOSIS — Z66 Do not resuscitate: Secondary | ICD-10-CM | POA: Diagnosis not present

## 2022-12-24 DIAGNOSIS — Z841 Family history of disorders of kidney and ureter: Secondary | ICD-10-CM

## 2022-12-24 DIAGNOSIS — Z803 Family history of malignant neoplasm of breast: Secondary | ICD-10-CM | POA: Diagnosis not present

## 2022-12-24 DIAGNOSIS — C563 Malignant neoplasm of bilateral ovaries: Principal | ICD-10-CM | POA: Diagnosis present

## 2022-12-24 DIAGNOSIS — G893 Neoplasm related pain (acute) (chronic): Secondary | ICD-10-CM | POA: Diagnosis present

## 2022-12-24 DIAGNOSIS — T40605A Adverse effect of unspecified narcotics, initial encounter: Secondary | ICD-10-CM | POA: Diagnosis present

## 2022-12-24 DIAGNOSIS — R1084 Generalized abdominal pain: Principal | ICD-10-CM | POA: Diagnosis present

## 2022-12-24 DIAGNOSIS — Z8489 Family history of other specified conditions: Secondary | ICD-10-CM | POA: Diagnosis not present

## 2022-12-24 DIAGNOSIS — F17210 Nicotine dependence, cigarettes, uncomplicated: Secondary | ICD-10-CM | POA: Diagnosis not present

## 2022-12-24 DIAGNOSIS — Z825 Family history of asthma and other chronic lower respiratory diseases: Secondary | ICD-10-CM

## 2022-12-24 DIAGNOSIS — R112 Nausea with vomiting, unspecified: Secondary | ICD-10-CM | POA: Diagnosis present

## 2022-12-24 DIAGNOSIS — Z6833 Body mass index (BMI) 33.0-33.9, adult: Secondary | ICD-10-CM

## 2022-12-24 DIAGNOSIS — F411 Generalized anxiety disorder: Secondary | ICD-10-CM | POA: Diagnosis not present

## 2022-12-24 DIAGNOSIS — Z818 Family history of other mental and behavioral disorders: Secondary | ICD-10-CM

## 2022-12-24 DIAGNOSIS — R180 Malignant ascites: Secondary | ICD-10-CM | POA: Diagnosis present

## 2022-12-24 DIAGNOSIS — K5903 Drug induced constipation: Secondary | ICD-10-CM | POA: Diagnosis not present

## 2022-12-24 DIAGNOSIS — Z79899 Other long term (current) drug therapy: Secondary | ICD-10-CM

## 2022-12-24 DIAGNOSIS — G4700 Insomnia, unspecified: Secondary | ICD-10-CM | POA: Diagnosis present

## 2022-12-24 DIAGNOSIS — Z8262 Family history of osteoporosis: Secondary | ICD-10-CM

## 2022-12-24 DIAGNOSIS — Z880 Allergy status to penicillin: Secondary | ICD-10-CM

## 2022-12-24 DIAGNOSIS — Z96642 Presence of left artificial hip joint: Secondary | ICD-10-CM | POA: Diagnosis present

## 2022-12-24 DIAGNOSIS — E669 Obesity, unspecified: Secondary | ICD-10-CM | POA: Diagnosis present

## 2022-12-24 DIAGNOSIS — T402X5A Adverse effect of other opioids, initial encounter: Secondary | ICD-10-CM | POA: Diagnosis not present

## 2022-12-24 LAB — URINALYSIS, ROUTINE W REFLEX MICROSCOPIC
Bacteria, UA: NONE SEEN
Bilirubin Urine: NEGATIVE
Glucose, UA: NEGATIVE mg/dL
Ketones, ur: NEGATIVE mg/dL
Leukocytes,Ua: NEGATIVE
Nitrite: NEGATIVE
Protein, ur: NEGATIVE mg/dL
Specific Gravity, Urine: 1.005 (ref 1.005–1.030)
pH: 5 (ref 5.0–8.0)

## 2022-12-24 LAB — CBC WITH DIFFERENTIAL/PLATELET
Abs Immature Granulocytes: 0.07 10*3/uL (ref 0.00–0.07)
Basophils Absolute: 0.1 10*3/uL (ref 0.0–0.1)
Basophils Relative: 0 %
Eosinophils Absolute: 0.1 10*3/uL (ref 0.0–0.5)
Eosinophils Relative: 1 %
HCT: 36.4 % (ref 36.0–46.0)
Hemoglobin: 12.5 g/dL (ref 12.0–15.0)
Immature Granulocytes: 1 %
Lymphocytes Relative: 10 %
Lymphs Abs: 1.1 10*3/uL (ref 0.7–4.0)
MCH: 37.5 pg — ABNORMAL HIGH (ref 26.0–34.0)
MCHC: 34.3 g/dL (ref 30.0–36.0)
MCV: 109.3 fL — ABNORMAL HIGH (ref 80.0–100.0)
Monocytes Absolute: 1 10*3/uL (ref 0.1–1.0)
Monocytes Relative: 9 %
Neutro Abs: 9 10*3/uL — ABNORMAL HIGH (ref 1.7–7.7)
Neutrophils Relative %: 79 %
Platelets: 494 10*3/uL — ABNORMAL HIGH (ref 150–400)
RBC: 3.33 MIL/uL — ABNORMAL LOW (ref 3.87–5.11)
RDW: 14.9 % (ref 11.5–15.5)
WBC: 11.2 10*3/uL — ABNORMAL HIGH (ref 4.0–10.5)
nRBC: 0 % (ref 0.0–0.2)

## 2022-12-24 LAB — COMPREHENSIVE METABOLIC PANEL
ALT: 40 U/L (ref 0–44)
AST: 43 U/L — ABNORMAL HIGH (ref 15–41)
Albumin: 2.7 g/dL — ABNORMAL LOW (ref 3.5–5.0)
Alkaline Phosphatase: 356 U/L — ABNORMAL HIGH (ref 38–126)
Anion gap: 14 (ref 5–15)
BUN: 22 mg/dL — ABNORMAL HIGH (ref 6–20)
CO2: 27 mmol/L (ref 22–32)
Calcium: 8.8 mg/dL — ABNORMAL LOW (ref 8.9–10.3)
Chloride: 88 mmol/L — ABNORMAL LOW (ref 98–111)
Creatinine, Ser: 0.93 mg/dL (ref 0.44–1.00)
GFR, Estimated: >60 mL/min (ref >60)
Glucose, Bld: 136 mg/dL — ABNORMAL HIGH (ref 70–99)
Potassium: 4.3 mmol/L (ref 3.5–5.1)
Sodium: 129 mmol/L — ABNORMAL LOW (ref 135–145)
Total Bilirubin: 0.4 mg/dL (ref <1.2)
Total Protein: 7.5 g/dL (ref 6.5–8.1)

## 2022-12-24 LAB — I-STAT CG4 LACTIC ACID, ED: Lactic Acid, Venous: 1.2 mmol/L (ref 0.5–1.9)

## 2022-12-24 LAB — LIPASE, BLOOD: Lipase: 23 U/L (ref 11–51)

## 2022-12-24 MED ORDER — HYDROMORPHONE 1 MG/ML IV SOLN: INTRAVENOUS | Status: DC

## 2022-12-24 MED ORDER — MAGNESIUM OXIDE -MG SUPPLEMENT 400 (240 MG) MG PO TABS
400.0000 mg | ORAL_TABLET | Freq: Two times a day (BID) | ORAL | Status: DC
  Administered 2022-12-24 – 2022-12-26 (×4): 400 mg via ORAL
  Filled 2022-12-24 (×4): qty 1

## 2022-12-24 MED ORDER — OXYCODONE HCL ER 15 MG PO T12A
60.0000 mg | EXTENDED_RELEASE_TABLET | Freq: Two times a day (BID) | ORAL | Status: DC
  Administered 2022-12-24 – 2022-12-28 (×8): 60 mg via ORAL
  Filled 2022-12-24 (×8): qty 4

## 2022-12-24 MED ORDER — HYDROMORPHONE 1 MG/ML IV SOLN
INTRAVENOUS | Status: DC
  Administered 2022-12-24: 30 mg via INTRAVENOUS
  Administered 2022-12-25: 8.5 mg via INTRAVENOUS
  Administered 2022-12-25: 3.5 mg via INTRAVENOUS
  Administered 2022-12-25: 4.5 mg via INTRAVENOUS
  Administered 2022-12-25 (×2): 1.5 mg via INTRAVENOUS
  Administered 2022-12-26: 30 mg via INTRAVENOUS
  Administered 2022-12-26: 4.5 mg via INTRAVENOUS
  Administered 2022-12-26: 1 mg via INTRAVENOUS
  Administered 2022-12-26: 3 mg via INTRAVENOUS
  Filled 2022-12-24 (×2): qty 30

## 2022-12-24 MED ORDER — HYDROMORPHONE HCL 2 MG/ML IJ SOLN
2.0000 mg | INTRAMUSCULAR | Status: DC | PRN
  Administered 2022-12-24: 2 mg via INTRAVENOUS
  Filled 2022-12-24: qty 1

## 2022-12-24 MED ORDER — SODIUM CHLORIDE 0.9 % IV BOLUS
1000.0000 mL | Freq: Once | INTRAVENOUS | Status: AC
  Administered 2022-12-24: 1000 mL via INTRAVENOUS

## 2022-12-24 MED ORDER — SODIUM CHLORIDE 0.9% FLUSH
9.0000 mL | INTRAVENOUS | Status: DC | PRN
Start: 2022-12-24 — End: 2022-12-26

## 2022-12-24 MED ORDER — DIPHENHYDRAMINE HCL 12.5 MG/5ML PO ELIX: 12.5000 mg | ORAL_SOLUTION | Freq: Four times a day (QID) | ORAL | Status: DC | PRN

## 2022-12-24 MED ORDER — ENOXAPARIN SODIUM 40 MG/0.4ML IJ SOSY
40.0000 mg | PREFILLED_SYRINGE | INTRAMUSCULAR | Status: DC
Start: 2022-12-24 — End: 2022-12-30
  Filled 2022-12-24 (×3): qty 0.4

## 2022-12-24 MED ORDER — NALOXONE HCL 0.4 MG/ML IJ SOLN
0.4000 mg | INTRAMUSCULAR | Status: DC | PRN
Start: 2022-12-24 — End: 2022-12-26

## 2022-12-24 MED ORDER — DOCUSATE SODIUM 50 MG PO CAPS: 250.0000 mg | ORAL_CAPSULE | Freq: Two times a day (BID) | ORAL | Status: DC

## 2022-12-24 MED ORDER — ACETAMINOPHEN 650 MG RE SUPP: 650.0000 mg | Freq: Four times a day (QID) | RECTAL | Status: DC | PRN

## 2022-12-24 MED ORDER — KETOROLAC TROMETHAMINE 30 MG/ML IJ SOLN
30.0000 mg | Freq: Four times a day (QID) | INTRAMUSCULAR | Status: AC
  Administered 2022-12-24 – 2022-12-29 (×20): 30 mg via INTRAVENOUS
  Filled 2022-12-24 (×20): qty 1

## 2022-12-24 MED ORDER — IOHEXOL 300 MG/ML  SOLN
100.0000 mL | Freq: Once | INTRAMUSCULAR | Status: AC | PRN
  Administered 2022-12-24: 100 mL via INTRAVENOUS

## 2022-12-24 MED ORDER — DIPHENHYDRAMINE HCL 50 MG/ML IJ SOLN
12.5000 mg | Freq: Four times a day (QID) | INTRAMUSCULAR | Status: DC | PRN
Start: 2022-12-24 — End: 2022-12-26

## 2022-12-24 MED ORDER — ONDANSETRON HCL 4 MG/2ML IJ SOLN
4.0000 mg | Freq: Four times a day (QID) | INTRAMUSCULAR | Status: DC | PRN
  Administered 2022-12-25: 4 mg via INTRAVENOUS
  Filled 2022-12-24: qty 2

## 2022-12-24 MED ORDER — ACETAMINOPHEN 325 MG PO TABS: 650.0000 mg | ORAL_TABLET | Freq: Four times a day (QID) | ORAL | Status: DC | PRN

## 2022-12-24 MED ORDER — HYDROMORPHONE HCL 2 MG/ML IJ SOLN
2.0000 mg | Freq: Once | INTRAMUSCULAR | Status: AC
  Administered 2022-12-24: 2 mg via INTRAVENOUS
  Filled 2022-12-24: qty 1

## 2022-12-24 MED ORDER — DOCUSATE SODIUM 100 MG PO CAPS
200.0000 mg | ORAL_CAPSULE | Freq: Two times a day (BID) | ORAL | Status: DC
  Administered 2022-12-24: 200 mg via ORAL
  Filled 2022-12-24: qty 2

## 2022-12-24 NOTE — ED Triage Notes (Signed)
Sent by oncology to be admitted. Patient reports abdominal pain x 2 weeks. Denies nausea and vomiting. Patient has been taking oxycodone at home, without relief. Patient has stage 4 ovarian cancer.

## 2022-12-24 NOTE — Assessment & Plan Note (Addendum)
Pt with uncontrolled abd pain due to metastatic ovarian cancer, admission requested for pain control: Continue home oxycontin 60mg  BID scheduled Starting toradol 30mg  IV Q6H scheduled Hold home short acting Oxycodone 30mg  Q4H PRN Using dilaudid PCA instead to try and titrate to pain relief: Pt far from opiate naive, will try initial settings of 2mg  load, 0.5mg  demand, 10 min lockout, and 3mg  / hr limit. Cont pulse ox Tele monitor And Cont ETCO2 monitoring Feel that this is the least risky, approach to very high dose opiates that this patient seems to be requiring for pain control. Pal care consult in epic for tomorrow for pain control and discuss GOC with metastatic ovarian CA Theraputic US paracentesis for large volume ascites.

## 2022-12-24 NOTE — Assessment & Plan Note (Signed)
Cont Effexor Holding Valium at least initially while on PCA opiates.

## 2022-12-24 NOTE — ED Notes (Signed)
ED TO INPATIENT HANDOFF REPORT  Name/Age/Gender Michelle Holt 52 y.o. female  Code Status    Code Status Orders  (From admission, onward)           Start     Ordered   12/24/22 2029  Do not attempt resuscitation (DNR)- Limited -Do Not Intubate (DNI)  Continuous       Question Answer Comment  If pulseless and not breathing No CPR or chest compressions.   In Pre-Arrest Conditions (Patient Is Breathing and Has A Pulse) Do not intubate. Provide all appropriate non-invasive medical interventions. Avoid ICU transfer unless indicated or required.   Consent: Discussion documented in EHR or advanced directives reviewed      12/24/22 2029           Code Status History     Date Active Date Inactive Code Status Order ID Comments User Context   12/15/2022 1909 12/16/2022 0802 Limited: Do not attempt resuscitation (DNR) -DNR-LIMITED -Do Not Intubate/DNI  [id number]  Briscoe Burns, MD ED   06/23/2021 0038 06/26/2021 1848 Full Code [id number]  Frankey Shown, DO ED   06/05/2020 1607 06/06/2020 1706 Full Code [id number]  Tarry Kos, MD Inpatient   12/29/2018 0532 12/31/2018 0315 Full Code [id number]  Doylene Bode, NP Inpatient   07/31/2018 2026 08/04/2018 1933 Full Code [id number]  Pearson Grippe, MD ED   07/18/2018 2247 07/21/2018 1847 Full Code [id number]  Levie Heritage, DO Inpatient   07/06/2018 2208 07/16/2018 2116 Full Code [id number]  Lorretta Harp, MD ED       Home/SNF/Other Home  Chief Complaint Cancer associated pain [G89.3]  Level of Care/Admitting Diagnosis ED Disposition     ED Disposition  Admit   Condition  --   Comment  Hospital Area: [facility] [[id number]]  Level of Care: Telemetry [5]  Admit to tele based on following criteria: Other see comments  Comments: PCA monitoring  May place patient in observation at [facility] or [facility] if equivalent level of care is available:: No  Covid Evaluation: Asymptomatic - no recent exposure (last 10 days)  testing not required  Diagnosis: Cancer associated pain [309085]  Admitting Physician: Hillary Bow [[id number]]  Attending Physician: Hillary Bow [phone]          Medical History Past Medical History:  Diagnosis Date   Anemia    Anxiety and depression    Arthritis of facet joints at multiple vertebral levels    L5-S1   Constipation    Dyslipidemia    Family history of breast cancer    Family history of uterine cancer    GERD (gastroesophageal reflux disease)    History of hiatal hernia    History of kidney stones    Insomnia    Irritable bowel syndrome    Migraine    Muscle tension headache    Neuropathy of finger    Ovarian carcinoma (HCC)    ovarian   Plantar fasciitis of right foot    Port-A-Cath in place 08/20/2018    Allergies Allergies  Allergen Reactions   Morphine And Codeine Itching   Nickel Itching   Nortriptyline Other (See Comments)    Significant weight gain   Topamax [Topiramate] Diarrhea and Nausea Only   Xanax [Alprazolam] Other (See Comments)    "Can't wake up"   Actifed Cold-Allergy [Chlorpheniramine-Phenyleph Er] Rash and Other (See Comments)    Red dye only   Amoxicillin Rash   Codeine Hives   Erythromycin  Rash   Penicillins Rash        Red Dye Rash   Sudafed [Pseudoephedrine Hcl] Rash and Other (See Comments)    Red dye only    IV Location/Drains/Wounds Patient Lines/Drains/Airways Status     Active Line/Drains/Airways     Name Placement date Placement time Site Days   Implanted Port 08/20/18 Right Chest 08/20/18  1335  Chest  1587            Labs/Imaging Results for orders placed or performed during the hospital encounter of 12/24/22 (from the past 48 hour(s))  CBC with Differential     Status: Abnormal   Collection Time: 12/24/22  2:31 PM  Result Value Ref Range   WBC 11.2 (H) 4.0 - 10.5 K/uL   RBC 3.33 (L) 3.87 - 5.11 MIL/uL   Hemoglobin 12.5 12.0 - 15.0 g/dL   HCT 16.1 09.6 - 04.5 %   MCV 109.3 (H) 80.0 -  100.0 fL   MCH 37.5 (H) 26.0 - 34.0 pg   MCHC 34.3 30.0 - 36.0 g/dL   RDW 40.9 81.1 - 91.4 %   Platelets 494 (H) 150 - 400 K/uL   nRBC 0.0 0.0 - 0.2 %   Neutrophils Relative % 79 %   Neutro Abs 9.0 (H) 1.7 - 7.7 K/uL   Lymphocytes Relative 10 %   Lymphs Abs 1.1 0.7 - 4.0 K/uL   Monocytes Relative 9 %   Monocytes Absolute 1.0 0.1 - 1.0 K/uL   Eosinophils Relative 1 %   Eosinophils Absolute 0.1 0.0 - 0.5 K/uL   Basophils Relative 0 %   Basophils Absolute 0.1 0.0 - 0.1 K/uL   Immature Granulocytes 1 %   Abs Immature Granulocytes 0.07 0.00 - 0.07 K/uL    Comment: Performed at [facility], [address] [place], [place] [address]  Comprehensive metabolic panel     Status: Abnormal   Collection Time: 12/24/22  2:31 PM  Result Value Ref Range   Sodium 129 (L) 135 - 145 mmol/L   Potassium 4.3 3.5 - 5.1 mmol/L   Chloride 88 (L) 98 - 111 mmol/L   CO2 27 22 - 32 mmol/L   Glucose, Bld 136 (H) 70 - 99 mg/dL    Comment: Glucose reference range applies only to samples taken after fasting for at least 8 hours.   BUN 22 (H) 6 - 20 mg/dL   Creatinine, Ser 6.21 0.44 - 1.00 mg/dL   Calcium 8.8 (L) 8.9 - 10.3 mg/dL   Total Protein 7.5 6.5 - 8.1 g/dL   Albumin 2.7 (L) 3.5 - 5.0 g/dL   AST 43 (H) 15 - 41 U/L   ALT 40 0 - 44 U/L   Alkaline Phosphatase 356 (H) 38 - 126 U/L   Total Bilirubin 0.4 <1.2 mg/dL   GFR, Estimated >30 >86 mL/min    Comment: (NOTE) Calculated using the CKD-EPI Creatinine Equation (2021)    Anion gap 14 5 - 15    Comment: Performed at [facility], [address] [place], [place] [address]  Lipase, blood     Status: None   Collection Time: 12/24/22  2:31 PM  Result Value Ref Range   Lipase 23 11 - 51 U/L    Comment: Performed at [facility], [address], [place], [place] [address]  I-Stat Lactic Acid     Status: None   Collection Time: 12/24/22  6:18 PM  Result Value Ref Range  Lactic Acid, Venous 1.2  0.5 - 1.9 mmol/L  Urinalysis, Routine w reflex microscopic -Urine, Clean Catch     Status: Abnormal   Collection Time: 12/24/22  8:03 PM  Result Value Ref Range   Color, Urine YELLOW YELLOW   APPearance CLEAR CLEAR   Specific Gravity, Urine 1.005 1.005 - 1.030   pH 5.0 5.0 - 8.0   Glucose, UA NEGATIVE NEGATIVE mg/dL   Hgb urine dipstick SMALL (A) NEGATIVE   Bilirubin Urine NEGATIVE NEGATIVE   Ketones, ur NEGATIVE NEGATIVE mg/dL   Protein, ur NEGATIVE NEGATIVE mg/dL   Nitrite NEGATIVE NEGATIVE   Leukocytes,Ua NEGATIVE NEGATIVE   RBC / HPF 6-10 0 - 5 RBC/hpf   WBC, UA 0-5 0 - 5 WBC/hpf   Bacteria, UA NONE SEEN NONE SEEN   Squamous Epithelial / HPF 11-20 0 - 5 /HPF   Mucus PRESENT     Comment: Performed at [facility], [address], [place], [place] [address]   *Note: Due to a large number of results and/or encounters for the requested time period, some results have not been displayed. A complete set of results can be found in Results Review.   CT ABDOMEN PELVIS W CONTRAST  Result Date: 12/24/2022 CLINICAL DATA:  Abdominal pain for 2 weeks, stage IV ovarian cancer EXAM: CT ABDOMEN AND PELVIS WITH CONTRAST TECHNIQUE: Multidetector CT imaging of the abdomen and pelvis was performed using the standard protocol following bolus administration of intravenous contrast. RADIATION DOSE REDUCTION: This exam was performed according to the departmental dose-optimization program which includes automated exposure control, adjustment of the mA and/or kV according to patient size and/or use of iterative reconstruction technique. CONTRAST:  OMNIPAQUE IOHEXOL 300 MG/ML  SOLN COMPARISON:  12/15/2022 FINDINGS: Lower chest: No acute pleural or parenchymal lung disease. Hepatobiliary: No focal liver abnormality is seen. Status post cholecystectomy. No biliary dilatation. Pancreas: Unremarkable. No pancreatic ductal dilatation or surrounding inflammatory changes. Spleen: Stable complex  and partially calcified splenic cyst. Spleen is stable. Adrenals/Urinary Tract: Adrenal glands are unremarkable. Kidneys are normal, without renal calculi, focal lesion, or hydronephrosis. Bladder is decompressed. Stomach/Bowel: No bowel obstruction or ileus.  Stable hiatal hernia. Vascular/Lymphatic: Stable aortic atherosclerosis. No other significant vascular finding. No discrete adenopathy within the abdomen or pelvis. Reproductive: Prior hysterectomy.  No adnexal mass. Other: Marked progression of the peritoneal carcinomatosis seen previously, with diffuse mesenteric nodules and increasing complex ascites throughout the abdomen and pelvis. No free intraperitoneal gas. No abdominal wall hernia. Musculoskeletal: Left hip arthroplasty. No acute or destructive bony abnormalities. Reconstructed images demonstrate no additional findings. IMPRESSION: 1. Worsening diffuse peritoneal carcinomatosis, with large volume complex ascites throughout the abdomen and pelvis increased since prior study. Findings are consistent with progressive metastatic ovarian cancer. 2. No bowel obstruction or ileus.  Stable hiatal hernia. 3.  Aortic Atherosclerosis (ICD10-I70.0). Electronically Signed   By: Sharlet Salina M.D.   On: 12/24/2022 19:14    Pending Labs Unresulted Labs (From admission, onward)     Start     Ordered   12/25/22 0500  CBC  Tomorrow morning,   R        12/24/22 2029   12/25/22 0500  Basic metabolic panel  Tomorrow morning,   R        12/24/22 2029   12/24/22 2029  HIV Antibody (routine testing w rflx)  (HIV Antibody (Routine testing w reflex) panel)  Once,   R        12/24/22 2029  Vitals/Pain Today's Vitals   12/24/22 1815 12/24/22 2041 12/24/22 2106 12/24/22 2126  BP: 110/64     Pulse: 90     Resp: 16     Temp:   98 F (36.7 C)   TempSrc:   Oral   SpO2: 97%     Weight:      Height:      PainSc:  5   8     Isolation Precautions No active  isolations  Medications Medications  oxyCODONE (OXYCONTIN) 12 hr tablet 60 mg (has no administration in time range)  naloxone (NARCAN) injection 0.4 mg (has no administration in time range)    And  sodium chloride flush (NS) 0.9 % injection 9 mL (has no administration in time range)  ondansetron (ZOFRAN) injection 4 mg (has no administration in time range)  diphenhydrAMINE (BENADRYL) injection 12.5 mg (has no administration in time range)    Or  diphenhydrAMINE (BENADRYL) 12.5 MG/5ML elixir 12.5 mg (has no administration in time range)  magnesium oxide (MAG-OX) tablet 400 mg (has no administration in time range)  ketorolac (TORADOL) 30 MG/ML injection 30 mg (30 mg Intravenous Given 12/24/22 2042)  enoxaparin (LOVENOX) injection 40 mg (has no administration in time range)  acetaminophen (TYLENOL) tablet 650 mg (has no administration in time range)    Or  acetaminophen (TYLENOL) suppository 650 mg (has no administration in time range)  HYDROmorphone (DILAUDID) injection 2 mg (2 mg Intravenous Given 12/24/22 2126)  HYDROmorphone (DILAUDID) 1 mg/mL PCA injection (has no administration in time range)  docusate sodium (COLACE) capsule 200 mg (has no administration in time range)  HYDROmorphone (DILAUDID) injection 2 mg (2 mg Intravenous Given 12/24/22 1805)  sodium chloride 0.9 % bolus 1,000 mL (1,000 mLs Intravenous New Bag/Given 12/24/22 1806)  iohexol (OMNIPAQUE) 300 MG/ML solution 100 mL (100 mLs Intravenous Contrast Given 12/24/22 1821)    Mobility walks

## 2022-12-24 NOTE — ED Provider Triage Note (Signed)
Emergency Medicine Provider Triage Evaluation Note  Michelle Holt , a 52 y.o. female  was evaluated in triage.  Pt complains of abd pain. Persistent ovarian cancer pain since Thanksgiving.  Unable to tolerates pain despite using her home pain medication.  Endorse nausea and vomiting and difficulty urinating and having constipation from opiate use.  Oncologist recommend hospital admission  Review of Systems  Positive: As above Negative: As above  Physical Exam  BP 126/66 (BP Location: Left Arm)   Pulse (!) 110   Temp 97.9 F (36.6 C) (Oral)   Resp 18   Ht 5\' 4"  (1.626 m)   Wt 89.4 kg   LMP  (LMP Unknown)   SpO2 95%   BMI 33.81 kg/m  Gen:   Awake, no distress   Resp:  Normal effort  MSK:   Moves extremities without difficulty  Other:    Medical Decision Making  Medically screening exam initiated at 2:15 PM.  Appropriate orders placed.  Michelle Holt was informed that the remainder of the evaluation will be completed by another provider, this initial triage assessment does not replace that evaluation, and the importance of remaining in the ED until their evaluation is complete.     Fayrene Helper, PA-C 12/24/22 [phone]

## 2022-12-24 NOTE — ED Provider Notes (Signed)
Emergency Department Provider Note   I have reviewed the triage vital signs and the nursing notes.   HISTORY  Chief Complaint Abdominal Pain   HPI Michelle Holt is a 52 y.o. female with past history reviewed below including ovarian cancer currently undergoing palliative chemotherapy presents with worsening abdominal pain since Thanksgiving.  Her pain symptoms have significantly worsened the past several days.  She had a brief admission on 12/1 for pain management and palliative care intervention.  She is followed by Dr. Ellin Saba in [place].  She left AGAINST MEDICAL ADVICE after her pain was not adequately controlled and staff were having difficulty getting the patient started on a PCA pump.  She continued her home medications, increased dosage from Dr. Ellin Saba, but continues to have severe pain.  No fevers.  Pain is mainly epigastric.  No chest discomfort or shortness of breath.  She was advised to present here for evaluation.   Past Medical History:  Diagnosis Date   Anemia    Anxiety and depression    Arthritis of facet joints at multiple vertebral levels    L5-S1   Constipation    Dyslipidemia    Family history of breast cancer    Family history of uterine cancer    GERD (gastroesophageal reflux disease)    History of hiatal hernia    History of kidney stones    Insomnia    Irritable bowel syndrome    Migraine    Muscle tension headache    Neuropathy of finger    Ovarian carcinoma (HCC)    ovarian   Plantar fasciitis of right foot    Port-A-Cath in place 08/20/2018    Review of Systems  Constitutional: No fever/chills Cardiovascular: Denies chest pain. Respiratory: Denies shortness of breath. Gastrointestinal: Positive abdominal pain. Positive nausea, no vomiting.   Genitourinary: Negative for dysuria. Musculoskeletal: Negative for back pain. Skin: Negative for rash. Neurological: Negative for headaches, focal weakness or  numbness.  ____________________________________________   PHYSICAL EXAM:  VITAL SIGNS: ED Triage Vitals  Encounter Vitals Group     BP 12/24/22 1353 126/66     Pulse Rate 12/24/22 1353 (!) 110     Resp 12/24/22 1353 18     Temp 12/24/22 1353 97.9 F (36.6 C)     Temp Source 12/24/22 1353 Oral     SpO2 12/24/22 1353 95 %     Weight 12/24/22 1353 197 lb (89.4 kg)     Height 12/24/22 1353 5\' 4"  (1.626 m)   Constitutional: Alert and oriented. Well appearing and in no acute distress. Eyes: Conjunctivae are normal.  Head: Atraumatic. Nose: No congestion/rhinnorhea. Mouth/Throat: Mucous membranes are moist.   Neck: No stridor. Cardiovascular: Normal rate, regular rhythm. Good peripheral circulation. Grossly normal heart sounds.   Respiratory: Normal respiratory effort.  No retractions. Lungs CTAB. Gastrointestinal: Soft with mild epigastric tenderness. Positive distention.  Musculoskeletal: No lower extremity tenderness nor edema. No gross deformities of extremities. Neurologic:  Normal speech and language.  Skin:  Skin is warm, dry and intact. No rash noted.  ____________________________________________   LABS (all labs ordered are listed, but only abnormal results are displayed)  Labs Reviewed  CBC WITH DIFFERENTIAL/PLATELET - Abnormal; Notable for the following components:      Result Value   WBC 11.2 (*)    RBC 3.33 (*)    MCV 109.3 (*)    MCH 37.5 (*)    Platelets 494 (*)    Neutro Abs 9.0 (*)  All other components within normal limits  COMPREHENSIVE METABOLIC PANEL - Abnormal; Notable for the following components:   Sodium 129 (*)    Chloride 88 (*)    Glucose, Bld 136 (*)    BUN 22 (*)    Calcium 8.8 (*)    Albumin 2.7 (*)    AST 43 (*)    Alkaline Phosphatase 356 (*)    All other components within normal limits  URINALYSIS, ROUTINE W REFLEX MICROSCOPIC - Abnormal; Notable for the following components:   Hgb urine dipstick SMALL (*)    All other  components within normal limits  LIPASE, BLOOD  HIV ANTIBODY (ROUTINE TESTING W REFLEX)  CBC  BASIC METABOLIC PANEL  I-STAT CG4 LACTIC ACID, ED  I-STAT CG4 LACTIC ACID, ED   ____________________________________________  RADIOLOGY  CT ABDOMEN PELVIS W CONTRAST  Result Date: 12/24/2022 CLINICAL DATA:  Abdominal pain for 2 weeks, stage IV ovarian cancer EXAM: CT ABDOMEN AND PELVIS WITH CONTRAST TECHNIQUE: Multidetector CT imaging of the abdomen and pelvis was performed using the standard protocol following bolus administration of intravenous contrast. RADIATION DOSE REDUCTION: This exam was performed according to the departmental dose-optimization program which includes automated exposure control, adjustment of the mA and/or kV according to patient size and/or use of iterative reconstruction technique. CONTRAST:  OMNIPAQUE IOHEXOL 300 MG/ML  SOLN COMPARISON:  12/15/2022 FINDINGS: Lower chest: No acute pleural or parenchymal lung disease. Hepatobiliary: No focal liver abnormality is seen. Status post cholecystectomy. No biliary dilatation. Pancreas: Unremarkable. No pancreatic ductal dilatation or surrounding inflammatory changes. Spleen: Stable complex and partially calcified splenic cyst. Spleen is stable. Adrenals/Urinary Tract: Adrenal glands are unremarkable. Kidneys are normal, without renal calculi, focal lesion, or hydronephrosis. Bladder is decompressed. Stomach/Bowel: No bowel obstruction or ileus.  Stable hiatal hernia. Vascular/Lymphatic: Stable aortic atherosclerosis. No other significant vascular finding. No discrete adenopathy within the abdomen or pelvis. Reproductive: Prior hysterectomy.  No adnexal mass. Other: Marked progression of the peritoneal carcinomatosis seen previously, with diffuse mesenteric nodules and increasing complex ascites throughout the abdomen and pelvis. No free intraperitoneal gas. No abdominal wall hernia. Musculoskeletal: Left hip arthroplasty. No acute or  destructive bony abnormalities. Reconstructed images demonstrate no additional findings. IMPRESSION: 1. Worsening diffuse peritoneal carcinomatosis, with large volume complex ascites throughout the abdomen and pelvis increased since prior study. Findings are consistent with progressive metastatic ovarian cancer. 2. No bowel obstruction or ileus.  Stable hiatal hernia. 3.  Aortic Atherosclerosis (ICD10-I70.0). Electronically Signed   By: Sharlet Salina M.D.   On: 12/24/2022 19:14    ____________________________________________   PROCEDURES  Procedure(s) performed:   Procedures  None ____________________________________________   INITIAL IMPRESSION / ASSESSMENT AND PLAN / ED COURSE  Pertinent labs & imaging results that were available during my care of the patient were reviewed by me and considered in my medical decision making (see chart for details).   This patient is Presenting for Evaluation of abdominal pain, which does require a range of treatment options, and is a complaint that involves a high risk of morbidity and mortality.  The Differential Diagnoses includes but is not exclusive to acute cholecystitis, intrathoracic causes for epigastric abdominal pain, gastritis, duodenitis, pancreatitis, small bowel or large bowel obstruction, abdominal aortic aneurysm, hernia, gastritis, etc.   Critical Interventions-    Medications  oxyCODONE (OXYCONTIN) 12 hr tablet 60 mg (has no administration in time range)  naloxone (NARCAN) injection 0.4 mg (has no administration in time range)    And  sodium chloride flush (NS)  0.9 % injection 9 mL (has no administration in time range)  ondansetron (ZOFRAN) injection 4 mg (has no administration in time range)  diphenhydrAMINE (BENADRYL) injection 12.5 mg (has no administration in time range)    Or  diphenhydrAMINE (BENADRYL) 12.5 MG/5ML elixir 12.5 mg (has no administration in time range)  magnesium oxide (MAG-OX) tablet 400 mg (400 mg Oral  Given 12/24/22 2303)  ketorolac (TORADOL) 30 MG/ML injection 30 mg (30 mg Intravenous Given 12/24/22 2042)  enoxaparin (LOVENOX) injection 40 mg (40 mg Subcutaneous Patient Refused/Not Given 12/24/22 2238)  acetaminophen (TYLENOL) tablet 650 mg (has no administration in time range)    Or  acetaminophen (TYLENOL) suppository 650 mg (has no administration in time range)  HYDROmorphone (DILAUDID) injection 2 mg (2 mg Intravenous Given 12/24/22 2126)  HYDROmorphone (DILAUDID) 1 mg/mL PCA injection (has no administration in time range)  docusate sodium (COLACE) capsule 200 mg (200 mg Oral Given 12/24/22 2303)  HYDROmorphone (DILAUDID) injection 2 mg (2 mg Intravenous Given 12/24/22 1805)  sodium chloride 0.9 % bolus 1,000 mL (1,000 mLs Intravenous New Bag/Given 12/24/22 1806)  iohexol (OMNIPAQUE) 300 MG/ML solution 100 mL (100 mLs Intravenous Contrast Given 12/24/22 1821)    Reassessment after intervention:  pain improved.    I did obtain Additional Historical Information from family at bedside.  I decided to review pertinent External Data, and in summary followed by Dr. Kirtland Bouchard at [facility]. Palliative chemo ordered but not yet started.   Clinical Laboratory Tests Ordered, included CBC with mild leukocytosis.  No anemia.  CMP without AKI.  Hyponatremia  Radiologic Tests Ordered, included CT abdomen/pelvis. I independently interpreted the images and agree with radiology interpretation.   Cardiac Monitor Tracing which shows NSR.    Social Determinants of Health Risk patient is a smoker.   Consult complete with [facility], Dr. Julian Reil. Plan for admit.   Medical Decision Making: Summary:  Patient presents emergency department with worsening abdominal discomfort.  She is distended, minimally tender abdomen in the epigastric region.  Doubt SBP.  Pain control attempts during her last admission were unsuccessful.  Pain medication increased at home but not adequately controlling her pain.  Plan to repeat  abdominal imaging here, follow labs, reassess after IV Dilaudid.  Reevaluation with update and discussion with patient. Pain improved. Plan for admit for PCA and palliative consultation in the AM.   Patient's presentation is most consistent with acute presentation with potential threat to life or bodily function.   Disposition: admit  ____________________________________________  FINAL CLINICAL IMPRESSION(S) / ED DIAGNOSES  Final diagnoses:  Generalized abdominal pain  Malignant neoplasm of ovary, unspecified laterality (HCC)    Note:  This document was prepared using Dragon voice recognition software and may include unintentional dictation errors.  Alona Bene, MD, [facility] Emergency Medicine    Camauri Fleece, Arlyss Repress, MD 12/24/22 [phone]

## 2022-12-24 NOTE — H&P (Signed)
History and Physical    Patient: Michelle Holt ZOX:[id number] DOB: 12-07-1970 DOA: 12/24/2022 DOS: the patient was seen and examined on 12/24/2022 PCP: Ignatius Specking, MD  Patient coming from: Home  Chief Complaint:  Chief Complaint  Patient presents with   Abdominal Pain   HPI: Michelle Holt is a 52 y.o. female with medical history significant of ovarian CA with metastatic dz to peritoneum.  Not yet on chemo.  Pt in to ED with c/o uncontrolled abd pain, symptoms ongoing since thanksgiving, seen earlier this month and admitted to [facility] 12/1 but left AMA after pain not adequately controlled, staff having issues getting PCA started.  No fevers, pain is diffuse, abdomen is distended.  Pt at home with increased dose of oxycontin this past week, but now in to ED with pain despite this.   Review of Systems: As mentioned in the history of present illness. All other systems reviewed and are negative. Past Medical History:  Diagnosis Date   Anemia    Anxiety and depression    Arthritis of facet joints at multiple vertebral levels    L5-S1   Constipation    Dyslipidemia    Family history of breast cancer    Family history of uterine cancer    GERD (gastroesophageal reflux disease)    History of hiatal hernia    History of kidney stones    Insomnia    Irritable bowel syndrome    Migraine    Muscle tension headache    Neuropathy of finger    Ovarian carcinoma (HCC)    ovarian   Plantar fasciitis of right foot    Port-A-Cath in place 08/20/2018   Past Surgical History:  Procedure Laterality Date   ABDOMINAL HYSTERECTOMY     BIOPSY  10/27/2020   Procedure: BIOPSY;  Surgeon: Dolores Frame, MD;  Location: [facility];  Service: Gastroenterology;;   CHOLECYSTECTOMY  2008   COLONOSCOPY N/A 08/13/2013   Procedure: COLONOSCOPY;  Surgeon: Malissa Hippo, MD;  Location: [facility];  Service: Endoscopy;  Laterality: N/A;  230-moved to 145 Ann to notify pt    COLONOSCOPY WITH PROPOFOL N/A 11/15/2020   Procedure: COLONOSCOPY WITH PROPOFOL;  Surgeon: Malissa Hippo, MD;  [facility]: [facility];  Service: Endoscopy;  Laterality: N/A;  1:40   ESOPHAGEAL DILATION N/A 02/28/2021   Procedure: ESOPHAGEAL DILATION;  Surgeon: Malissa Hippo, MD;  [facility];  Service: Endoscopy;  Laterality: N/A;   ESOPHAGOGASTRODUODENOSCOPY     ESOPHAGOGASTRODUODENOSCOPY (EGD) WITH PROPOFOL N/A 07/20/2018   Procedure: ESOPHAGOGASTRODUODENOSCOPY (EGD) WITH PROPOFOL;  Surgeon: Malissa Hippo, MD;  [facility];  Service: Endoscopy;  Laterality: N/A;  Possible esophageal dilation.   ESOPHAGOGASTRODUODENOSCOPY (EGD) WITH PROPOFOL N/A 10/27/2020   Procedure: ESOPHAGOGASTRODUODENOSCOPY (EGD) WITH PROPOFOL;  Surgeon: Dolores Frame, MD;  [facility];  Service: Gastroenterology;  Laterality: N/A;  2:10, pt knows to arrive at 10:15   ESOPHAGOGASTRODUODENOSCOPY (EGD) WITH PROPOFOL N/A 02/28/2021   Procedure: ESOPHAGOGASTRODUODENOSCOPY (EGD) WITH PROPOFOL;  Surgeon: Malissa Hippo, MD;  [facility];  Service: Endoscopy;  Laterality: N/A;  200 ASA 1   ESOPHAGOGASTRODUODENOSCOPY (EGD) WITH PROPOFOL N/A 03/29/2022   Procedure: ESOPHAGOGASTRODUODENOSCOPY (EGD) WITH PROPOFOL;  Surgeon: Dolores Frame, MD;  [facility];  Service: Gastroenterology;  Laterality: N/A;  2:00 pm, asa 1-2   GIVENS CAPSULE STUDY N/A 11/24/2020   Procedure: GIVENS CAPSULE STUDY;  Surgeon: Malissa Hippo, MD;  [facility];  Service: Endoscopy;  Laterality: N/A;  7:30   IR ANGIOGRAM SELECTIVE EACH ADDITIONAL VESSEL  08/01/2018   IR ANGIOGRAM VISCERAL SELECTIVE  08/01/2018   IR EMBO ART  VEN HEMORR LYMPH EXTRAV  INC GUIDE ROADMAPPING  08/01/2018   IR IMAGING GUIDED PORT INSERTION  08/20/2018   IR PERC PLEURAL DRAIN W/INDWELL CATH W/IMG GUIDE  07/08/2018   IR THORACENTESIS ASP PLEURAL SPACE W/IMG GUIDE  07/07/2018   IR US GUIDE  VASC ACCESS RIGHT  08/01/2018   PLEURAL EFFUSION DRAINAGE Left 07/13/2018   Procedure: DRAINAGE OF LOCULATED PLEURAL EFFUSION;  Surgeon: Kerin Perna, MD;  Location: [facility] OR;  Service: Thoracic;  Laterality: Left;   POLYPECTOMY  11/15/2020   Procedure: POLYPECTOMY;  Surgeon: Malissa Hippo, MD;  [facility];  Service: Endoscopy;;   REMOVAL OF PLEURAL DRAINAGE CATHETER Left 08/20/2018   Procedure: REMOVAL OF PLEURAL DRAINAGE CATHETER;  Surgeon: Kerin Perna, MD;  Location: [facility] OR;  Service: Thoracic;  Laterality: Left;   REMOVAL OF PLEURAL DRAINAGE CATHETER Left 08/20/2018   Procedure: REMOVAL OF PLEURAL DRAINAGE CATHETER;  Surgeon: Kerin Perna, MD;  Location: [facility] OR;  Service: Thoracic;  Laterality: Left;   TALC PLEURODESIS Left 07/13/2018   Procedure: Talc Pleuradesis;  Surgeon: Donata Clay, Theron Arista, MD;  Location: [facility] OR;  Service: Thoracic;  Laterality: Left;   TOTAL HIP ARTHROPLASTY Left 06/05/2020   Procedure: LEFT TOTAL HIP ARTHROPLASTY ANTERIOR APPROACH;  Surgeon: Tarry Kos, MD;  Location: [facility] OR;  Service: Orthopedics;  Laterality: Left;  3-C   TUBAL LIGATION Bilateral    UTERINE ABLATION     VIDEO ASSISTED THORACOSCOPY Left 07/13/2018   Procedure: VIDEO ASSISTED THORACOSCOPY;  Surgeon: Kerin Perna, MD;  Location: [facility] OR;  Service: Thoracic;  Laterality: Left;   Social History:  reports that she has been smoking cigarettes. She started smoking about 21 years ago. She has a 8.5 pack-year smoking history. She has never used smokeless tobacco. She reports that she does not currently use alcohol after a past usage of about 1.0 standard drink of alcohol per week. She reports that she does not use drugs.  Allergies  Allergen Reactions   Morphine And Codeine Itching   Nickel Itching   Nortriptyline Other (See Comments)    Significant weight gain   Topamax [Topiramate] Diarrhea and Nausea Only   Xanax [Alprazolam] Other (See Comments)    "Can't wake up"   Actifed  Cold-Allergy [Chlorpheniramine-Phenyleph Er] Rash and Other (See Comments)    Red dye only   Amoxicillin Rash   Codeine Hives   Erythromycin Rash   Penicillins Rash        Red Dye Rash   Sudafed [Pseudoephedrine Hcl] Rash and Other (See Comments)    Red dye only    Family History  Problem Relation Age of Onset   Depression Mother    Hypertension Mother    Obesity Mother    Diabetes Mother    Kidney disease Mother    Peripheral vascular disease Father    Atrial fibrillation Father    Crohn's disease Sister    Uterine cancer Sister 38       maternal half sister   COPD Brother    Osteoporosis Brother    Breast cancer Maternal Aunt 50   Colon cancer Neg Hx     Prior to Admission medications   Medication Sig Start Date End Date Taking? Authorizing Provider  albuterol (VENTOLIN HFA) 108 (90 Base)  MCG/ACT inhaler Inhale 2 puffs into the lungs every 6 (six) hours as needed for wheezing or shortness of breath.    [provider]  Bioflavonoid Products (VITAMIN C) CHEW Chew 1 tablet by mouth daily.    [provider]  Cyanocobalamin (B-12) 3000 MCG CAPS Take 3,000 mcg by mouth every other day.    [provider]  diazepam (VALIUM) 5 MG tablet Take 1 tablet (5 mg total) by mouth at bedtime. 09/05/22   Doreatha Massed, MD  furosemide (LASIX) 20 MG tablet Take 1 tablet (20 mg total) by mouth daily as needed. Patient taking differently: Take 20 mg by mouth daily as needed for fluid or edema. 12/02/22   Doreatha Massed, MD  gabapentin (NEURONTIN) 100 MG capsule Take 3 capsules (300 mg total) by mouth daily. TAKE THREE (3) CAPSULES BY MOUTH AT BEDTIME 09/02/22   Doreatha Massed, MD  Lactulose 20 GM/30ML SOLN Take 30 ML at bedtime.  If no bowel movement in 3-4 days take 30 ML every 3 hours until you have a bowel movement. 12/18/22   Doreatha Massed, MD  LORazepam (ATIVAN) 0.5 MG tablet Take 0.5 mg by mouth 3 (three) times daily as needed for  anxiety. 07/04/21   [provider]  magnesium oxide (MAG-OX) 400 (240 Mg) MG tablet Take 1 tablet (400 mg total) by mouth 2 (two) times daily. 10/09/21   Carnella Guadalajara, PA-C  omeprazole (PRILOSEC) 40 MG capsule TAKE ONE CAPSULE BY MOUTH TWICE DAILY BEFORE MEALS Patient taking differently: Take 40 mg by mouth daily. 04/29/22   Dolores Frame, MD  ondansetron (ZOFRAN) 4 MG tablet Take 1 tablet (4 mg total) by mouth every 8 (eight) hours as needed for nausea or vomiting. 08/20/22   Doreatha Massed, MD  oxyCODONE (OXYCONTIN) 60 MG 12 hr tablet Take 60 mg by mouth every 12 (twelve) hours. 12/16/22   Doreatha Massed, MD  oxycodone (ROXICODONE) 30 MG immediate release tablet Take 30 mg by mouth every 4 (four) hours as needed for pain. 12/10/22   [provider]  rosuvastatin (CRESTOR) 5 MG tablet Take 5 mg by mouth daily. 10/05/22   [provider]  venlafaxine XR (EFFEXOR-XR) 150 MG 24 hr capsule Take 150 mg by mouth daily. 10/03/22   [provider]    Physical Exam: Vitals:   12/24/22 1353 12/24/22 1712 12/24/22 1815  BP: 126/66 102/71 110/64  Pulse: (!) 110 (!) 102 90  Resp: 18 14 16   Temp: 97.9 F (36.6 C) 98.2 F (36.8 C)   TempSrc: Oral Oral   SpO2: 95% 96% 97%  Weight: 89.4 kg    Height: 5\' 4"  (1.626 m)     Constitutional: NAD Respiratory: clear to auscultation bilaterally, no wheezing, no crackles. Normal respiratory effort. No accessory muscle use.  Cardiovascular: Regular rate and rhythm, no murmurs / rubs / gallops. No extremity edema. 2+ pedal pulses. No carotid bruits.  Abdomen: Distended with ascites, TTP Neurologic: CN 2-12 grossly intact. Sensation intact, DTR normal. Strength 5/5 in all 4.  Psychiatric: Normal judgment and insight. Alert and oriented x 3. Normal mood.   Data Reviewed:    Labs on Admission: I have personally reviewed following labs and imaging studies  CBC: Recent Labs  Lab 12/24/22 1431  WBC  11.2*  NEUTROABS 9.0*  HGB 12.5  HCT 36.4  MCV 109.3*  PLT 494*   Basic Metabolic Panel: Recent Labs  Lab 12/24/22 1431  NA 129*  K 4.3  CL 88*  CO2 27  GLUCOSE 136*  BUN 22*  CREATININE 0.93  CALCIUM 8.8*   GFR: Estimated Creatinine Clearance: 75.8 mL/min (by C-G formula based on SCr of 0.93 mg/dL). Liver Function Tests: Recent Labs  Lab 12/24/22 1431  AST 43*  ALT 40  ALKPHOS 356*  BILITOT 0.4  PROT 7.5  ALBUMIN 2.7*   Recent Labs  Lab 12/24/22 1431  LIPASE 23   No results for input(s): "AMMONIA" in the last 168 hours. Coagulation Profile: No results for input(s): "INR", "PROTIME" in the last 168 hours. Cardiac Enzymes: No results for input(s): "CKTOTAL", "CKMB", "CKMBINDEX", "TROPONINI" in the last 168 hours. BNP (last 3 results) No results for input(s): "PROBNP" in the last 8760 hours. HbA1C: No results for input(s): "HGBA1C" in the last 72 hours. CBG: No results for input(s): "GLUCAP" in the last 168 hours. Lipid Profile: No results for input(s): "CHOL", "HDL", "LDLCALC", "TRIG", "CHOLHDL", "LDLDIRECT" in the last 72 hours. Thyroid Function Tests: No results for input(s): "TSH", "T4TOTAL", "FREET4", "T3FREE", "THYROIDAB" in the last 72 hours. Anemia Panel: No results for input(s): "VITAMINB12", "FOLATE", "FERRITIN", "TIBC", "IRON", "RETICCTPCT" in the last 72 hours. Urine analysis:    Component Value Date/Time   COLORURINE YELLOW 12/24/2022 2003   APPEARANCEUR CLEAR 12/24/2022 2003   APPEARANCEUR Clear 08/02/2021 1036   LABSPEC 1.005 12/24/2022 2003   PHURINE 5.0 12/24/2022 2003   GLUCOSEU NEGATIVE 12/24/2022 2003   HGBUR SMALL (A) 12/24/2022 2003   BILIRUBINUR NEGATIVE 12/24/2022 2003   BILIRUBINUR Negative 08/02/2021 1036   KETONESUR NEGATIVE 12/24/2022 2003   PROTEINUR NEGATIVE 12/24/2022 2003   NITRITE NEGATIVE 12/24/2022 2003   LEUKOCYTESUR NEGATIVE 12/24/2022 2003    Radiological Exams on Admission: CT ABDOMEN PELVIS W  CONTRAST  Result Date: 12/24/2022 CLINICAL DATA:  Abdominal pain for 2 weeks, stage IV ovarian cancer EXAM: CT ABDOMEN AND PELVIS WITH CONTRAST TECHNIQUE: Multidetector CT imaging of the abdomen and pelvis was performed using the standard protocol following bolus administration of intravenous contrast. RADIATION DOSE REDUCTION: This exam was performed according to the departmental dose-optimization program which includes automated exposure control, adjustment of the mA and/or kV according to patient size and/or use of iterative reconstruction technique. CONTRAST:  OMNIPAQUE IOHEXOL 300 MG/ML  SOLN COMPARISON:  12/15/2022 FINDINGS: Lower chest: No acute pleural or parenchymal lung disease. Hepatobiliary: No focal liver abnormality is seen. Status post cholecystectomy. No biliary dilatation. Pancreas: Unremarkable. No pancreatic ductal dilatation or surrounding inflammatory changes. Spleen: Stable complex and partially calcified splenic cyst. Spleen is stable. Adrenals/Urinary Tract: Adrenal glands are unremarkable. Kidneys are normal, without renal calculi, focal lesion, or hydronephrosis. Bladder is decompressed. Stomach/Bowel: No bowel obstruction or ileus.  Stable hiatal hernia. Vascular/Lymphatic: Stable aortic atherosclerosis. No other significant vascular finding. No discrete adenopathy within the abdomen or pelvis. Reproductive: Prior hysterectomy.  No adnexal mass. Other: Marked progression of the peritoneal carcinomatosis seen previously, with diffuse mesenteric nodules and increasing complex ascites throughout the abdomen and pelvis. No free intraperitoneal gas. No abdominal wall hernia. Musculoskeletal: Left hip arthroplasty. No acute or destructive bony abnormalities. Reconstructed images demonstrate no additional findings. IMPRESSION: 1. Worsening diffuse peritoneal carcinomatosis, with large volume complex ascites throughout the abdomen and pelvis increased since prior study. Findings are  consistent with progressive metastatic ovarian cancer. 2. No bowel obstruction or ileus.  Stable hiatal hernia. 3.  Aortic Atherosclerosis (ICD10-I70.0). Electronically Signed   By: Sharlet Salina M.D.   On: 12/24/2022 19:14    EKG: Independently reviewed.   Assessment and Plan: * Cancer  associated pain Pt with uncontrolled abd pain due to metastatic ovarian cancer, admission requested for pain control: Continue home oxycontin 60mg  BID scheduled Starting toradol 30mg  IV Q6H scheduled Hold home short acting Oxycodone 30mg  Q4H PRN Using dilaudid PCA instead to try and titrate to pain relief: Pt far from opiate naive, will try initial settings of 2mg  load, 0.5mg  demand, 10 min lockout, and 3mg  / hr limit. Cont pulse ox Tele monitor And Cont ETCO2 monitoring Feel that this is the least risky, approach to very high dose opiates that this patient seems to be requiring for pain control. Pal care consult in epic for tomorrow for pain control and discuss GOC with metastatic ovarian CA Theraputic US paracentesis for large volume ascites.  Ovarian cancer, bilateral (HCC) With metastatic dz to peritoneum causing significant abd pain.  Progressive on todays CT. Adding IP oncology consult to epic so they see she's being admitted.  Generalized anxiety disorder Cont Effexor Holding Valium at least initially while on PCA opiates.      Advance Care Planning:   Code Status: Limited: Do not attempt resuscitation (DNR) -DNR-LIMITED -Do Not Intubate/DNI  Confirmed with patient  Consults: IP consults to oncology and pal care put into epic  Family Communication: Family at bedside  Severity of Illness: The appropriate patient status for this patient is OBSERVATION. Observation status is judged to be reasonable and necessary in order to provide the required intensity of service to ensure the patient's safety. The patient's presenting symptoms, physical exam findings, and initial radiographic and  laboratory data in the context of their medical condition is felt to place them at decreased risk for further clinical deterioration. Furthermore, it is anticipated that the patient will be medically stable for discharge from the hospital within 2 midnights of admission.   Author: Hillary Bow., DO 12/24/2022 8:39 PM  For on call review [email or web address].

## 2022-12-24 NOTE — ED Notes (Addendum)
Pt alert and oriented with daughter at beside.Call bell and personal items within reach

## 2022-12-24 NOTE — Assessment & Plan Note (Addendum)
With metastatic dz to peritoneum causing significant abd pain.  Progressive on todays CT. Adding IP oncology consult to epic so they see she's being admitted.

## 2022-12-25 ENCOUNTER — Ambulatory Visit (HOSPITAL_COMMUNITY): Payer: 59

## 2022-12-25 ENCOUNTER — Inpatient Hospital Stay: Payer: 59

## 2022-12-25 ENCOUNTER — Observation Stay (HOSPITAL_COMMUNITY): Payer: 59

## 2022-12-25 ENCOUNTER — Inpatient Hospital Stay: Payer: 59 | Attending: Hematology

## 2022-12-25 DIAGNOSIS — Z9109 Other allergy status, other than to drugs and biological substances: Secondary | ICD-10-CM | POA: Diagnosis not present

## 2022-12-25 DIAGNOSIS — R180 Malignant ascites: Secondary | ICD-10-CM | POA: Diagnosis present

## 2022-12-25 DIAGNOSIS — G893 Neoplasm related pain (acute) (chronic): Secondary | ICD-10-CM | POA: Diagnosis present

## 2022-12-25 DIAGNOSIS — C563 Malignant neoplasm of bilateral ovaries: Secondary | ICD-10-CM

## 2022-12-25 DIAGNOSIS — Z515 Encounter for palliative care: Secondary | ICD-10-CM | POA: Diagnosis not present

## 2022-12-25 DIAGNOSIS — Z8543 Personal history of malignant neoplasm of ovary: Secondary | ICD-10-CM | POA: Diagnosis not present

## 2022-12-25 DIAGNOSIS — E222 Syndrome of inappropriate secretion of antidiuretic hormone: Secondary | ICD-10-CM | POA: Diagnosis present

## 2022-12-25 DIAGNOSIS — E669 Obesity, unspecified: Secondary | ICD-10-CM | POA: Diagnosis present

## 2022-12-25 DIAGNOSIS — R188 Other ascites: Secondary | ICD-10-CM | POA: Diagnosis not present

## 2022-12-25 DIAGNOSIS — C786 Secondary malignant neoplasm of retroperitoneum and peritoneum: Secondary | ICD-10-CM | POA: Diagnosis present

## 2022-12-25 DIAGNOSIS — Z880 Allergy status to penicillin: Secondary | ICD-10-CM | POA: Diagnosis not present

## 2022-12-25 DIAGNOSIS — Z66 Do not resuscitate: Secondary | ICD-10-CM | POA: Diagnosis present

## 2022-12-25 DIAGNOSIS — K5903 Drug induced constipation: Secondary | ICD-10-CM | POA: Diagnosis not present

## 2022-12-25 DIAGNOSIS — E785 Hyperlipidemia, unspecified: Secondary | ICD-10-CM | POA: Diagnosis present

## 2022-12-25 DIAGNOSIS — Z841 Family history of disorders of kidney and ureter: Secondary | ICD-10-CM | POA: Diagnosis not present

## 2022-12-25 DIAGNOSIS — R1084 Generalized abdominal pain: Secondary | ICD-10-CM | POA: Diagnosis present

## 2022-12-25 DIAGNOSIS — Z803 Family history of malignant neoplasm of breast: Secondary | ICD-10-CM | POA: Diagnosis not present

## 2022-12-25 DIAGNOSIS — T402X5A Adverse effect of other opioids, initial encounter: Secondary | ICD-10-CM | POA: Diagnosis not present

## 2022-12-25 DIAGNOSIS — Z95828 Presence of other vascular implants and grafts: Secondary | ICD-10-CM

## 2022-12-25 DIAGNOSIS — F17210 Nicotine dependence, cigarettes, uncomplicated: Secondary | ICD-10-CM | POA: Diagnosis present

## 2022-12-25 DIAGNOSIS — Z8489 Family history of other specified conditions: Secondary | ICD-10-CM | POA: Diagnosis not present

## 2022-12-25 DIAGNOSIS — Z800 Family history of malignant neoplasm of digestive organs: Secondary | ICD-10-CM | POA: Diagnosis not present

## 2022-12-25 DIAGNOSIS — Z881 Allergy status to other antibiotic agents status: Secondary | ICD-10-CM | POA: Diagnosis not present

## 2022-12-25 DIAGNOSIS — Z888 Allergy status to other drugs, medicaments and biological substances status: Secondary | ICD-10-CM | POA: Diagnosis not present

## 2022-12-25 DIAGNOSIS — Z885 Allergy status to narcotic agent status: Secondary | ICD-10-CM | POA: Diagnosis not present

## 2022-12-25 DIAGNOSIS — C569 Malignant neoplasm of unspecified ovary: Secondary | ICD-10-CM | POA: Diagnosis not present

## 2022-12-25 DIAGNOSIS — Z8249 Family history of ischemic heart disease and other diseases of the circulatory system: Secondary | ICD-10-CM | POA: Diagnosis not present

## 2022-12-25 DIAGNOSIS — F411 Generalized anxiety disorder: Secondary | ICD-10-CM | POA: Diagnosis present

## 2022-12-25 DIAGNOSIS — Z6833 Body mass index (BMI) 33.0-33.9, adult: Secondary | ICD-10-CM | POA: Diagnosis not present

## 2022-12-25 DIAGNOSIS — Z833 Family history of diabetes mellitus: Secondary | ICD-10-CM | POA: Diagnosis not present

## 2022-12-25 LAB — BASIC METABOLIC PANEL
Anion gap: 11 (ref 5–15)
BUN: 25 mg/dL — ABNORMAL HIGH (ref 6–20)
CO2: 28 mmol/L (ref 22–32)
Calcium: 8.5 mg/dL — ABNORMAL LOW (ref 8.9–10.3)
Chloride: 87 mmol/L — ABNORMAL LOW (ref 98–111)
Creatinine, Ser: 1.21 mg/dL — ABNORMAL HIGH (ref 0.44–1.00)
GFR, Estimated: 54 mL/min — ABNORMAL LOW (ref >60)
Glucose, Bld: 102 mg/dL — ABNORMAL HIGH (ref 70–99)
Potassium: 4 mmol/L (ref 3.5–5.1)
Sodium: 126 mmol/L — ABNORMAL LOW (ref 135–145)

## 2022-12-25 LAB — CBC
HCT: 33.4 % — ABNORMAL LOW (ref 36.0–46.0)
Hemoglobin: 11.2 g/dL — ABNORMAL LOW (ref 12.0–15.0)
MCH: 37.5 pg — ABNORMAL HIGH (ref 26.0–34.0)
MCHC: 33.5 g/dL (ref 30.0–36.0)
MCV: 111.7 fL — ABNORMAL HIGH (ref 80.0–100.0)
Platelets: 476 10*3/uL — ABNORMAL HIGH (ref 150–400)
RBC: 2.99 MIL/uL — ABNORMAL LOW (ref 3.87–5.11)
RDW: 15.1 % (ref 11.5–15.5)
WBC: 9.9 10*3/uL (ref 4.0–10.5)
nRBC: 0 % (ref 0.0–0.2)

## 2022-12-25 LAB — HIV ANTIBODY (ROUTINE TESTING W REFLEX): HIV Screen 4th Generation wRfx: NONREACTIVE

## 2022-12-25 MED ORDER — LIDOCAINE HCL 1 % IJ SOLN
INTRAMUSCULAR | Status: AC
  Filled 2022-12-25: qty 20

## 2022-12-25 MED ORDER — POLYETHYLENE GLYCOL 3350 17 G PO PACK
17.0000 g | PACK | Freq: Two times a day (BID) | ORAL | Status: DC
  Administered 2022-12-25 – 2022-12-26 (×4): 17 g via ORAL
  Filled 2022-12-25 (×8): qty 1

## 2022-12-25 NOTE — Plan of Care (Signed)
Pt arrived from ED alert and oriented x4. Pt's daughter accompanies her at bedside. POC was revied, pt verbalized understanding. PCA initiated for pain management. Safety precautions reviewed with pt and daughter, both verbalized understanding of plan. Call light within reach.   Problem: Education: Goal: Knowledge of General Education information will improve Description: Including pain rating scale, medication(s)/side effects and non-pharmacologic comfort measures Outcome: Progressing   Problem: Clinical Measurements: Goal: Ability to maintain clinical measurements within normal limits will improve Outcome: Progressing   Problem: Activity: Goal: Risk for activity intolerance will decrease Outcome: Progressing   Problem: Nutrition: Goal: Adequate nutrition will be maintained Outcome: Progressing   Problem: Coping: Goal: Level of anxiety will decrease Outcome: Progressing   Problem: Elimination: Goal: Will not experience complications related to bowel motility Outcome: Progressing   Problem: Pain Management: Goal: General experience of comfort will improve Outcome: Progressing   Problem: Safety: Goal: Ability to remain free from injury will improve Outcome: Progressing   Problem: Skin Integrity: Goal: Risk for impaired skin integrity will decrease Outcome: Progressing

## 2022-12-25 NOTE — Progress Notes (Signed)
PROGRESS NOTE    Michelle Holt  ONG:[id number] DOB: 1970-11-01 DOA: 12/24/2022 PCP: Ignatius Specking, MD    Brief Narrative:    Michelle Holt is a 52 y.o. female with medical history significant of ovarian CA with peritoneal metastasis not on chemo presented to hospital with uncontrolled abdominal pain since Thanksgiving.  Patient was admitted earlier this month at [facility] on 12/15/2022 but left AGAINST MEDICAL ADVICE after being not being adequately controlled and his staff having issues with PCA.  Patient denied any fever but complains of diffuse abdominal pain with distention.  Of note patient was given increased dose of OxyContin this past week.    Assessment and plan.  Cancer associated pain Patient is on oxycontin 60mg  BID scheduled at home.  Has been continued.  On Toradol 30 mg every 6 hours with short acting oxycodone 30 every 4 hrly.  On Dilaudid PCA pump with improved pain control today..  Palliative care consultation will with goals of care discussion.  Status post  paracentesis today.  Follows up with Dr. Ellin Saba oncology as outpatient and her next follow-up  coming Tuesday.  Had missed chemotherapy due to being in the hospital.   Ovarian cancer, bilateral (HCC) Peritoneal metastasis with significant abdominal pain.  Oncology has been notified regarding admission.    Generalized anxiety disorder Cont Effexor.  Valium has been on hold while on PCA opiates.     DVT prophylaxis: enoxaparin (LOVENOX) injection 40 mg Start: 12/24/22 2200   Code Status:     Code Status: Limited: Do not attempt resuscitation (DNR) -DNR-LIMITED -Do Not Intubate/DNI   Disposition: Home likely in 1 to 2 days  Status is: Observation The patient will require care spanning > 2 midnights and should be moved to inpatient because: Uncontrolled pain, on PCA pump, pending clinical improvement and pain control.   Family Communication: Spoke with the patient's daughter at bedside.  Consultants:   Oncology notified,  palliative care   Procedures:  PCA pump  Antimicrobials:  None  Anti-infectives (From admission, onward)    None        Subjective: Today, patient was seen and examined at bedside.  States that her pain in the abdomen has lessened with the PCA pump.  Has not had a bowel movement on MiraLAX.  Denies any shortness of breath chest pain.  Patient's daughter at bedside.  She had missed her chemotherapy due to being in the hospital.  Objective: Vitals:   12/25/22 0915 12/25/22 0950 12/25/22 0954 12/25/22 1001  BP: 102/63 104/74 106/70 103/67  Pulse:      Resp:      Temp:      TempSrc:      SpO2:      Weight:      Height:        Intake/Output Summary (Last 24 hours) at 12/25/2022 1046 Last data filed at 12/25/2022 0600 Gross per 24 hour  Intake 38.5 ml  Output 1 ml  Net 37.5 ml   Filed Weights   12/24/22 1353  Weight: 89.4 kg    Physical Examination: Body mass index is 33.81 kg/m.  General:  Average built, not in obvious distress HENT:   No scleral pallor or icterus noted. Oral mucosa is moist.  Chest:  Clear breath sounds.  No crackles or wheezes.  Right chest wall Port-A-Cath in place. CVS: S1 &S2 heard. No murmur.  Regular rate and rhythm. Abdomen: Soft, tenderness on palpation, mildly distended,.  Bowel sounds are heard.  Extremities: No cyanosis, clubbing or edema.  Peripheral pulses are palpable. Psych: Alert, awake and oriented, normal mood CNS:  No cranial nerve deficits.  Power equal in all extremities.   Skin: Warm and dry.  No rashes noted.  Data Reviewed:   CBC: Recent Labs  Lab 12/24/22 1431 12/25/22 0500  WBC 11.2* 9.9  NEUTROABS 9.0*  --   HGB 12.5 11.2*  HCT 36.4 33.4*  MCV 109.3* 111.7*  PLT 494* 476*    Basic Metabolic Panel: Recent Labs  Lab 12/24/22 1431 12/25/22 0500  NA 129* 126*  K 4.3 4.0  CL 88* 87*  CO2 27 28  GLUCOSE 136* 102*  BUN 22* 25*  CREATININE 0.93 1.21*  CALCIUM 8.8* 8.5*     Liver Function Tests: Recent Labs  Lab 12/24/22 1431  AST 43*  ALT 40  ALKPHOS 356*  BILITOT 0.4  PROT 7.5  ALBUMIN 2.7*     Radiology Studies: CT ABDOMEN PELVIS W CONTRAST  Result Date: 12/24/2022 CLINICAL DATA:  Abdominal pain for 2 weeks, stage IV ovarian cancer EXAM: CT ABDOMEN AND PELVIS WITH CONTRAST TECHNIQUE: Multidetector CT imaging of the abdomen and pelvis was performed using the standard protocol following bolus administration of intravenous contrast. RADIATION DOSE REDUCTION: This exam was performed according to the departmental dose-optimization program which includes automated exposure control, adjustment of the mA and/or kV according to patient size and/or use of iterative reconstruction technique. CONTRAST:  OMNIPAQUE IOHEXOL 300 MG/ML  SOLN COMPARISON:  12/15/2022 FINDINGS: Lower chest: No acute pleural or parenchymal lung disease. Hepatobiliary: No focal liver abnormality is seen. Status post cholecystectomy. No biliary dilatation. Pancreas: Unremarkable. No pancreatic ductal dilatation or surrounding inflammatory changes. Spleen: Stable complex and partially calcified splenic cyst. Spleen is stable. Adrenals/Urinary Tract: Adrenal glands are unremarkable. Kidneys are normal, without renal calculi, focal lesion, or hydronephrosis. Bladder is decompressed. Stomach/Bowel: No bowel obstruction or ileus.  Stable hiatal hernia. Vascular/Lymphatic: Stable aortic atherosclerosis. No other significant vascular finding. No discrete adenopathy within the abdomen or pelvis. Reproductive: Prior hysterectomy.  No adnexal mass. Other: Marked progression of the peritoneal carcinomatosis seen previously, with diffuse mesenteric nodules and increasing complex ascites throughout the abdomen and pelvis. No free intraperitoneal gas. No abdominal wall hernia. Musculoskeletal: Left hip arthroplasty. No acute or destructive bony abnormalities. Reconstructed images demonstrate no additional  findings. IMPRESSION: 1. Worsening diffuse peritoneal carcinomatosis, with large volume complex ascites throughout the abdomen and pelvis increased since prior study. Findings are consistent with progressive metastatic ovarian cancer. 2. No bowel obstruction or ileus.  Stable hiatal hernia. 3.  Aortic Atherosclerosis (ICD10-I70.0). Electronically Signed   By: Sharlet Salina M.D.   On: 12/24/2022 19:14      LOS: 0 days     Joycelyn Das, MD [facility] Available via Epic secure chat 7am-7pm After these hours, please refer to coverage provider listed on [email or web address] 12/25/2022, 10:46 AM

## 2022-12-25 NOTE — Procedures (Signed)
PROCEDURE SUMMARY:  Successful US guided paracentesis from right lower quadrant.  Yielded 2 L of clear  fluid.  No immediate complications.  Pt tolerated well.   Specimen not sent for labs.  EBL < 2 mL  Mickie Kay, NP 12/25/2022 11:55 AM

## 2022-12-25 NOTE — Consult Note (Signed)
Consultation Note Date: 12/25/2022   Patient Name: Michelle Holt  DOB: 24-Nov-1970  MRN: [id number]  Age / Sex: 52 y.o., female  PCP: Ignatius Specking, MD Referring Physician: Joycelyn Das, MD  Reason for Consultation: Establishing goals of care  HPI/Patient Profile: 52 y.o. female admitted on 12/24/2022  Michelle Holt is a 52 y.o. female with medical history significant of ovarian CA with peritoneal metastasis not on chemo presented to hospital with uncontrolled abdominal pain since Thanksgiving.  Patient was admitted earlier this month at [facility] on 12/15/2022 but left AGAINST MEDICAL ADVICE after her pain not being adequately controlled and staff reportedly having issues with PCA.  Patient denied any fever but complains of diffuse abdominal pain with distention.  Of note patient was given increased dose of OxyContin this past week 60mg  BID.   Clinical Assessment and Goals of Care: Palliative consult for pain management, known pain symptom management and goals of care discussions has been requested. Chart reviewed, patient seen and examined, discussed with daughter present at bedside as well as patient. Palliative medicine is specialized medical care for people living with serious illness. It focuses on providing relief from the symptoms and stress of a serious illness. The goal is to improve quality of life for both the patient and the family. Goals of care: Broad aims of medical therapy in relation to the patient's values and preferences. Our aim is to provide medical care aimed at enabling patients to achieve the goals that matter most to them, given the circumstances of their particular medical situation and their constraints.    NEXT OF KIN Daughter Verda Bowman who lives with the patient and is her primary caregiver.   SUMMARY OF RECOMMENDATIONS   Symptom management options discussed with the  patient.  She states that she has had increased pain decreasing mobility and essentially her health has been going downhill since Thanksgiving, since the past 2 weeks.  She reports adequate pain relief on current Dilaudid PCA settings.  She is on 0.5 mg every 10 minutes to be used on an as needed basis bolus PCA.  Also has availability for 1 mg loading dose.  Additionally, long-acting opioids OxyContin 60 mg every 12 hours by mouth is being continued as well.  To remain on PCA for another 24 hours, calculate total opioid requirements p.o. plus PCA in a.m. on 12-26-2022. We discussed about augmenting her bowel regimen as the patient has been having constipation. Overall, patient wishes to have addition of palliative services in the home setting.  She wishes to continue goals of care discussions regarding her overall health and her cancer treatment journey with her primary oncologist upon discharge. Thank you for the consult.  Code Status/Advance Care Planning: DNR   Symptom Management:  Bowel regimen: Discontinue Colace add MiraLAX twice daily and monitor.  Palliative Prophylaxis:  Frequent Pain Assessment  Additional Recommendations (Limitations, Scope, Preferences): Outpatient palliative care.   Psycho-social/Spiritual:  Desire for further Chaplaincy support:yes Additional Recommendations: Caregiving  Support/Resources  Prognosis:  Unable to  determine  Discharge Planning: Home with Palliative Services      Primary Diagnoses: Present on Admission:  Cancer associated pain  Ovarian cancer, bilateral (HCC)  Generalized anxiety disorder   I have reviewed the medical record, interviewed the patient and family, and examined the patient. The following aspects are pertinent.  Past Medical History:  Diagnosis Date   Anemia    Anxiety and depression    Arthritis of facet joints at multiple vertebral levels    L5-S1   Constipation    Dyslipidemia    Family history of breast cancer     Family history of uterine cancer    GERD (gastroesophageal reflux disease)    History of hiatal hernia    History of kidney stones    Insomnia    Irritable bowel syndrome    Migraine    Muscle tension headache    Neuropathy of finger    Ovarian carcinoma (HCC)    ovarian   Plantar fasciitis of right foot    Port-A-Cath in place 08/20/2018   Social History   Socioeconomic History   Marital status: Widowed    Spouse name: Not on file   Number of children: 2   Years of education: 2-College   Highest education level: Not on file  Occupational History    Employer: [employer]  Tobacco Use   Smoking status: Every Day    Current packs/day: 0.50    Average packs/day: 0.5 packs/day for 17.0 years (8.5 ttl pk-yrs)    Types: Cigarettes    Start date: 06/21/2001    Last attempt to quit: 06/22/2018   Smokeless tobacco: Never  Vaping Use   Vaping status: Never Used  Substance and Sexual Activity   Alcohol use: Not Currently    Alcohol/week: 1.0 standard drink of alcohol    Types: 1 Glasses of wine per week    Comment: occasionally   Drug use: No   Sexual activity: Not Currently  Other Topics Concern   Not on file  Social History Narrative   Patient lives at home with her daughter.    Patient has 2 children.    Patient is widowed.    Patient is right handed.    Patient has her Associates degree.      Social Determinants of Health   Financial Resource Strain: Low Risk  (08/26/2022)   Received from [facility]   Overall Financial Resource Strain (CARDIA)    Difficulty of Paying Living Expenses: Not hard at all  Food Insecurity: No Food Insecurity (12/24/2022)   Hunger Vital Sign    Worried About Running Out of Food in the Last Year: Never true    Ran Out of Food in the Last Year: Never true  Transportation Needs: No Transportation Needs (12/24/2022)   PRAPARE - Administrator, Civil Service (Medical): No    Lack of Transportation (Non-Medical): No   Physical Activity: Inactive (07/22/2018)   Exercise Vital Sign    Days of Exercise per Week: 0 days    Minutes of Exercise per Session: 0 min  Stress: Stress Concern Present (07/22/2018)   [employer] of Occupational Health - Occupational Stress Questionnaire    Feeling of Stress : Very much  Social Connections: Moderately Isolated (07/22/2018)   Social Connection and Isolation Panel [NHANES]    Frequency of Communication with Friends and Family: More than three times a week    Frequency of Social Gatherings with Friends and Family: More than three times  a week    Attends Religious Services: 1 to 4 times per year    Active Member of Clubs or Organizations: No    Attends Banker Meetings: Never    Marital Status: Widowed   Family History  Problem Relation Age of Onset   Depression Mother    Hypertension Mother    Obesity Mother    Diabetes Mother    Kidney disease Mother    Peripheral vascular disease Father    Atrial fibrillation Father    Crohn's disease Sister    Uterine cancer Sister 32       maternal half sister   COPD Brother    Osteoporosis Brother    Breast cancer Maternal Aunt 50   Colon cancer Neg Hx    Scheduled Meds:  enoxaparin (LOVENOX) injection  40 mg Subcutaneous Q24H   HYDROmorphone   Intravenous Q4H   ketorolac  30 mg Intravenous Q6H   magnesium oxide  400 mg Oral BID   oxyCODONE  60 mg Oral Q12H   polyethylene glycol  17 g Oral BID   Continuous Infusions: PRN Meds:.acetaminophen **OR** acetaminophen, diphenhydrAMINE **OR** diphenhydrAMINE, HYDROmorphone (DILAUDID) injection, naloxone **AND** sodium chloride flush, ondansetron (ZOFRAN) IV Medications Prior to Admission:  Prior to Admission medications   Medication Sig Start Date End Date Taking? Authorizing Provider  albuterol (VENTOLIN HFA) 108 (90 Base) MCG/ACT inhaler Inhale 2 puffs into the lungs every 6 (six) hours as needed for wheezing or shortness of breath.   Yes [provider]  Bioflavonoid Products (VITAMIN C) CHEW Chew 1 tablet by mouth daily.   Yes [provider]  Cholecalciferol (VITAMIN D3) 50 MCG (2000 UT) TABS Take 2,000 Units by mouth daily.   Yes [provider]  Cyanocobalamin (B-12) 3000 MCG CAPS Take 3,000 mcg by mouth daily.   Yes [provider]  diazepam (VALIUM) 5 MG tablet Take 1 tablet (5 mg total) by mouth at bedtime. Patient taking differently: Take 5 mg by mouth at bedtime as needed (for sleep). 09/05/22  Yes Doreatha Massed, MD  docusate sodium (COLACE) 100 MG capsule Take 200 mg by mouth in the morning and at bedtime.   Yes [provider]  furosemide (LASIX) 20 MG tablet Take 1 tablet (20 mg total) by mouth daily as needed. Patient taking differently: Take 20 mg by mouth in the morning. 12/02/22  Yes Doreatha Massed, MD  gabapentin (NEURONTIN) 100 MG capsule Take 3 capsules (300 mg total) by mouth daily. TAKE THREE (3) CAPSULES BY MOUTH AT BEDTIME Patient taking differently: Take 300 mg by mouth at bedtime. 09/02/22  Yes Doreatha Massed, MD  Lactulose 20 GM/30ML SOLN Take 30 ML at bedtime.  If no bowel movement in 3-4 days take 30 ML every 3 hours until you have a bowel movement. Patient taking differently: Take 30 mLs by mouth at bedtime as needed (for a bowel movement and if no movement after 3-4 days, increase to 30 ml's every 3 hours until a movement is achieved- HOLD FOR DIARRHEA). 12/18/22  Yes Doreatha Massed, MD  Lido-PE-Glycerin-Petrolatum (PREPARATION H RAPID RELIEF) 5-0.25-14.4-15 % CREA Apply 1 application  topically every 6 (six) hours as needed (for hemorrhoids).   Yes [provider]  LORazepam (ATIVAN) 0.5 MG tablet Take 0.5 mg by mouth 3 (three) times daily as needed for anxiety. 07/04/21  Yes [provider]  magnesium oxide (MAG-OX) 400 (240 Mg) MG tablet Take 1 tablet (400 mg total) by mouth 2 (two) times  daily. 10/09/21  Yes Pennington, Rebekah  M, PA-C  omeprazole (PRILOSEC) 40 MG capsule TAKE ONE CAPSULE BY MOUTH TWICE DAILY BEFORE MEALS Patient taking differently: Take 40 mg by mouth at bedtime. 04/29/22  Yes Dolores Frame, MD  ondansetron (ZOFRAN) 4 MG tablet Take 1 tablet (4 mg total) by mouth every 8 (eight) hours as needed for nausea or vomiting. 08/20/22  Yes Doreatha Massed, MD  oxyCODONE (OXYCONTIN) 60 MG 12 hr tablet Take 60 mg by mouth every 12 (twelve) hours. 12/16/22  Yes Doreatha Massed, MD  oxycodone (ROXICODONE) 30 MG immediate release tablet Take 30 mg by mouth every 4 (four) hours as needed for pain. 12/10/22  Yes [provider]  prochlorperazine (COMPAZINE) 10 MG tablet Take 10 mg by mouth 2 (two) times daily as needed for nausea or vomiting.   Yes [provider]  promethazine (PHENERGAN) 25 MG tablet Take 25 mg by mouth every 6 (six) hours as needed for nausea or vomiting.   Yes [provider]  rosuvastatin (CRESTOR) 5 MG tablet Take 5 mg by mouth at bedtime. 10/05/22  Yes [provider]  venlafaxine XR (EFFEXOR-XR) 150 MG 24 hr capsule Take 150 mg by mouth at bedtime. 10/03/22  Yes [provider]   Allergies  Allergen Reactions   Morphine And Codeine Itching   Nickel Itching   Nortriptyline Other (See Comments)    Significant weight gain   Topamax [Topiramate] Diarrhea and Nausea Only   Xanax [Alprazolam] Other (See Comments)    "Can't wake up"   Actifed Cold-Allergy [Chlorpheniramine-Phenyleph Er] Rash and Other (See Comments)    Red dye only   Amoxicillin Rash   Codeine Hives   Erythromycin Rash   Penicillins Rash        Red Dye Rash   Sudafed [Pseudoephedrine Hcl] Rash and Other (See Comments)    Red dye only   Review of Systems +abdominal pain + back pain.   Physical Exam Awake alert resting in bed Regular work of breathing Abdomen less distended after paracentesis today Trace lower extremity edema Has Port-A-Cath right chest  wall  Vital Signs: BP 103/67   Pulse 78   Temp 97.8 F (36.6 C) (Oral)   Resp 16   Ht 5\' 4"  (1.626 m)   Wt 89.4 kg   LMP  (LMP Unknown)   SpO2 96%   BMI 33.81 kg/m  Pain Scale: 0-10 POSS *See Group Information*: 1-Acceptable,Awake and alert Pain Score: 3    SpO2: SpO2: 96 % O2 Device:SpO2: 96 % O2 Flow Rate: .O2 Flow Rate (L/min): 3 L/min  IO: Intake/output summary:  Intake/Output Summary (Last 24 hours) at 12/25/2022 1108 Last data filed at 12/25/2022 0600 Gross per 24 hour  Intake 38.5 ml  Output 1 ml  Net 37.5 ml    LBM: Last BM Date : 12/24/22 Baseline Weight: Weight: 89.4 kg Most recent weight: Weight: 89.4 kg     Palliative Assessment/Data:   PPS 50%  Time In:  10 Time Out:  11 Time Total:  60 min.  Greater than 50%  of this time was spent counseling and coordinating care related to the above assessment and plan.  Signed by: Rosalin Hawking, MD   Please contact Palliative Medicine Team phone at [phone] for questions and concerns.  For individual provider: See Loretha Stapler

## 2022-12-26 ENCOUNTER — Other Ambulatory Visit: Payer: Self-pay

## 2022-12-26 DIAGNOSIS — G893 Neoplasm related pain (acute) (chronic): Secondary | ICD-10-CM | POA: Diagnosis not present

## 2022-12-26 LAB — CBC
HCT: 31 % — ABNORMAL LOW (ref 36.0–46.0)
Hemoglobin: 10.2 g/dL — ABNORMAL LOW (ref 12.0–15.0)
MCH: 37 pg — ABNORMAL HIGH (ref 26.0–34.0)
MCHC: 32.9 g/dL (ref 30.0–36.0)
MCV: 112.3 fL — ABNORMAL HIGH (ref 80.0–100.0)
Platelets: 482 10*3/uL — ABNORMAL HIGH (ref 150–400)
RBC: 2.76 MIL/uL — ABNORMAL LOW (ref 3.87–5.11)
RDW: 15.1 % (ref 11.5–15.5)
WBC: 9.2 10*3/uL (ref 4.0–10.5)
nRBC: 0 % (ref 0.0–0.2)

## 2022-12-26 LAB — BASIC METABOLIC PANEL
Anion gap: 11 (ref 5–15)
BUN: 30 mg/dL — ABNORMAL HIGH (ref 6–20)
CO2: 28 mmol/L (ref 22–32)
Calcium: 8 mg/dL — ABNORMAL LOW (ref 8.9–10.3)
Chloride: 91 mmol/L — ABNORMAL LOW (ref 98–111)
Creatinine, Ser: 1.06 mg/dL — ABNORMAL HIGH (ref 0.44–1.00)
GFR, Estimated: >60 mL/min (ref >60)
Glucose, Bld: 105 mg/dL — ABNORMAL HIGH (ref 70–99)
Potassium: 4.3 mmol/L (ref 3.5–5.1)
Sodium: 130 mmol/L — ABNORMAL LOW (ref 135–145)

## 2022-12-26 LAB — MAGNESIUM: Magnesium: 2.8 mg/dL — ABNORMAL HIGH (ref 1.7–2.4)

## 2022-12-26 MED ORDER — HYDROMORPHONE HCL 4 MG PO TABS
4.0000 mg | ORAL_TABLET | ORAL | Status: DC | PRN
  Administered 2022-12-26: 4 mg via ORAL
  Administered 2022-12-26: 6 mg via ORAL
  Administered 2022-12-26: 4 mg via ORAL
  Administered 2022-12-26 – 2022-12-28 (×7): 6 mg via ORAL
  Filled 2022-12-26: qty 2
  Filled 2022-12-26: qty 1
  Filled 2022-12-26 (×3): qty 2
  Filled 2022-12-26: qty 1
  Filled 2022-12-26 (×4): qty 2

## 2022-12-26 MED ORDER — BISACODYL 5 MG PO TBEC
10.0000 mg | DELAYED_RELEASE_TABLET | Freq: Every day | ORAL | Status: DC | PRN
  Administered 2022-12-26 – 2022-12-28 (×2): 10 mg via ORAL
  Filled 2022-12-26 (×2): qty 2

## 2022-12-26 MED ORDER — LACTULOSE 10 GM/15ML PO SOLN
30.0000 g | Freq: Every day | ORAL | Status: DC | PRN
  Filled 2022-12-26: qty 45

## 2022-12-26 MED ORDER — CHLORHEXIDINE GLUCONATE CLOTH 2 % EX PADS
6.0000 | MEDICATED_PAD | Freq: Every day | CUTANEOUS | Status: DC
  Administered 2022-12-26 – 2022-12-28 (×3): 6 via TOPICAL

## 2022-12-26 MED ORDER — HYDROMORPHONE HCL 2 MG/ML IJ SOLN
2.0000 mg | INTRAMUSCULAR | Status: DC | PRN
  Administered 2022-12-26 – 2022-12-28 (×8): 2 mg via INTRAVENOUS
  Filled 2022-12-26 (×8): qty 1

## 2022-12-26 NOTE — Progress Notes (Addendum)
PROGRESS NOTE    Michelle Holt  ZOX:[id number] DOB: [birth year] DOA: 12/24/2022 PCP: Ignatius Specking, MD    Brief Narrative:    Michelle Holt is a 52 y.o. female with medical history significant of ovarian CA with peritoneal metastasis not on chemo presented to hospital with uncontrolled abdominal pain since Thanksgiving.  Patient was admitted earlier this month at [facility] on 12/15/2022 but left AGAINST MEDICAL ADVICE after being not being adequately controlled and his staff having issues with PCA.  Patient denied any fever but complains of diffuse abdominal pain with distention.  Of note patient was given increased dose of OxyContin this past week.    Assessment and plan.  Cancer associated pain Patient is on oxycontin 60mg  BID scheduled at home.  Has been continued.  On Toradol 30 mg every 6 hours. Patient was on on Dilaudid PCA pump with improved pain control today and palliative care has discontinued PCA pump and transitioned to Dilaudid IV Q4 hourly and orally every 6 hours.  As well..  Palliative care on board and patient wished to find out if she was a hospice candidate.  Communicated with oncology will state that she might be a potential candidate.  Oncology to discuss with the patient as outpatient.  Patient underwent  paracentesis as well during hospitalization.  Follows up with Dr. Ellin Saba oncology as outpatient and her next follow-up  coming Tuesday.  Had missed chemotherapy due to being in the hospital.  On 3 L of oxygen by nasal cannula.  Urinalysis was negative.  Lipase was negative.   Ovarian cancer, bilateral with Malignant ascites Peritoneal metastasis with significant abdominal pain.  Oncology has been notified regarding admission.  Continue pain control. Status post paracentesis.  Generalized anxiety disorder Cont Effexor.  Valium has been on hold while on PCA opiates.  Mild hyponatremia.  Asymptomatic.  Closely monitor.  Sodium level of 130 today.  Grade I obesity.  Body mass index is 33.81 kg/m.  Would benefit from ongoing weight loss as outpatient.      DVT prophylaxis: enoxaparin (LOVENOX) injection 40 mg Start: 12/24/22 2200   Code Status:     Code Status: Limited: Do not attempt resuscitation (DNR) -DNR-LIMITED -Do Not Intubate/DNI   Disposition: Home likely in 1 to 2 days when pain is adequately controlled and okay with palliative care..  Status is: Inpatient The patient is inpatient because: pending adequate pain control, IV narcotics   Family Communication: Spoke with the patient's daughter at bedside.  Consultants:  Oncology notified,  palliative care   Procedures:  PCA pump  Antimicrobials:  None  Anti-infectives (From admission, onward)    None        Subjective: Today, patient was seen and examined at bedside.  States that her pain is much better controlled today.  Has been transition from PCA pump to IV oral Dilaudid with long-acting OxyContin.  Has not had a bowel movement.    Objective: Vitals:   12/26/22 0400 12/26/22 0520 12/26/22 0655 12/26/22 0826  BP:  (!) 108/48    Pulse:  77    Resp: 10 16 (!) 9 14  Temp:  98.1 F (36.7 C)    TempSrc:  Oral    SpO2: 95% 99% 96% 94%  Weight:      Height:        Intake/Output Summary (Last 24 hours) at 12/26/2022 1324 Last data filed at 12/25/2022 1756 Gross per 24 hour  Intake 3.5 ml  Output --  Net  3.5 ml   Filed Weights   12/24/22 1353  Weight: 89.4 kg    Physical Examination: Body mass index is 33.81 kg/m.  General: Obese built, not in obvious distress, on nasal cannula oxygen, HENT:   No scleral pallor or icterus noted. Oral mucosa is moist.  Chest:  Clear breath sounds.  Right chest wall Port-A-Cath in place. CVS: S1 &S2 heard. No murmur.  Regular rate and rhythm. Abdomen: Soft, mild tenderness on palpation especially on the lower side with mild distention. Extremities: No cyanosis, clubbing or edema.  Peripheral pulses are palpable. Psych:  Alert, awake and oriented, normal mood CNS:  No cranial nerve deficits.  Power equal in all extremities.   Skin: Warm and dry.  No rashes noted.  Data Reviewed:   CBC: Recent Labs  Lab 12/24/22 1431 12/25/22 0500 12/26/22 1011  WBC 11.2* 9.9 9.2  NEUTROABS 9.0*  --   --   HGB 12.5 11.2* 10.2*  HCT 36.4 33.4* 31.0*  MCV 109.3* 111.7* 112.3*  PLT 494* 476* 482*    Basic Metabolic Panel: Recent Labs  Lab 12/24/22 1431 12/25/22 0500 12/26/22 1011  NA 129* 126* 130*  K 4.3 4.0 4.3  CL 88* 87* 91*  CO2 27 28 28   GLUCOSE 136* 102* 105*  BUN 22* 25* 30*  CREATININE 0.93 1.21* 1.06*  CALCIUM 8.8* 8.5* 8.0*  MG  --   --  2.8*    Liver Function Tests: Recent Labs  Lab 12/24/22 1431  AST 43*  ALT 40  ALKPHOS 356*  BILITOT 0.4  PROT 7.5  ALBUMIN 2.7*     Radiology Studies: US Paracentesis Result Date: 12/25/2022 INDICATION: Patient with a history of ovarian cancer with recurrent ascites. Interventional radiology asked to perform a therapeutic paracentesis. EXAM: ULTRASOUND GUIDED PARACENTESIS MEDICATIONS: 1% lidocaine 10 mL COMPLICATIONS: None immediate. PROCEDURE: Informed written consent was obtained from the patient after a discussion of the risks, benefits and alternatives to treatment. A timeout was performed prior to the initiation of the procedure. Initial ultrasound scanning demonstrates a large amount of ascites within the right lower abdominal quadrant. The right lower abdomen was prepped and draped in the usual sterile fashion. 1% lidocaine was used for local anesthesia. Following this, a 19 gauge, 7-cm, Yueh catheter was introduced. An ultrasound image was saved for documentation purposes. The paracentesis was performed. The catheter was removed and a dressing was applied. The patient tolerated the procedure well without immediate post procedural complication. FINDINGS: A total of approximately 2 L of clear yellow fluid was removed. IMPRESSION: Successful  ultrasound-guided paracentesis yielding 2 liters of peritoneal fluid. Procedure performed by Alwyn Ren NP Electronically Signed   By: Gilmer Mor D.O.   On: 12/25/2022 12:20   CT ABDOMEN PELVIS W CONTRAST Result Date: 12/24/2022 CLINICAL DATA:  Abdominal pain for 2 weeks, stage IV ovarian cancer EXAM: CT ABDOMEN AND PELVIS WITH CONTRAST TECHNIQUE: Multidetector CT imaging of the abdomen and pelvis was performed using the standard protocol following bolus administration of intravenous contrast. RADIATION DOSE REDUCTION: This exam was performed according to the departmental dose-optimization program which includes automated exposure control, adjustment of the mA and/or kV according to patient size and/or use of iterative reconstruction technique. CONTRAST:  OMNIPAQUE IOHEXOL 300 MG/ML  SOLN COMPARISON:  12/15/2022 FINDINGS: Lower chest: No acute pleural or parenchymal lung disease. Hepatobiliary: No focal liver abnormality is seen. Status post cholecystectomy. No biliary dilatation. Pancreas: Unremarkable. No pancreatic ductal dilatation or surrounding inflammatory changes. Spleen: Stable complex  and partially calcified splenic cyst. Spleen is stable. Adrenals/Urinary Tract: Adrenal glands are unremarkable. Kidneys are normal, without renal calculi, focal lesion, or hydronephrosis. Bladder is decompressed. Stomach/Bowel: No bowel obstruction or ileus.  Stable hiatal hernia. Vascular/Lymphatic: Stable aortic atherosclerosis. No other significant vascular finding. No discrete adenopathy within the abdomen or pelvis. Reproductive: Prior hysterectomy.  No adnexal mass. Other: Marked progression of the peritoneal carcinomatosis seen previously, with diffuse mesenteric nodules and increasing complex ascites throughout the abdomen and pelvis. No free intraperitoneal gas. No abdominal wall hernia. Musculoskeletal: Left hip arthroplasty. No acute or destructive bony abnormalities. Reconstructed images  demonstrate no additional findings. IMPRESSION: 1. Worsening diffuse peritoneal carcinomatosis, with large volume complex ascites throughout the abdomen and pelvis increased since prior study. Findings are consistent with progressive metastatic ovarian cancer. 2. No bowel obstruction or ileus.  Stable hiatal hernia. 3.  Aortic Atherosclerosis (ICD10-I70.0). Electronically Signed   By: Sharlet Salina M.D.   On: 12/24/2022 19:14      LOS: 1 day     Joycelyn Das, MD [facility] Available via Epic secure chat 7am-7pm After these hours, please refer to coverage provider listed on [email or web address] 12/26/2022, 1:24 PM

## 2022-12-26 NOTE — Plan of Care (Signed)

## 2022-12-26 NOTE — Progress Notes (Signed)
Daily Progress Note   Patient Name: Michelle Holt       Date: 12/26/2022 DOB: [birth year]  Age: 52 y.o. MRN#: [id number] Attending Physician: Joycelyn Das, MD Primary Care Physician: Ignatius Specking, MD Admit Date: 12/24/2022  Reason for Consultation/Follow-up: Establishing goals of care, Non pain symptom management, and Pain control  Subjective: Awake alert resting in bed, daughter at bedside.  Rested well overnight.  Pain well-controlled on PCA.  Also on long-acting OxyContin.  Feels like she is passing gas but has not had a bowel movement.  Started on MiraLAX twice daily from 12-25-2022.  Length of Stay: 1  Current Medications: Scheduled Meds:   Chlorhexidine Gluconate Cloth  6 each Topical Daily   enoxaparin (LOVENOX) injection  40 mg Subcutaneous Q24H   ketorolac  30 mg Intravenous Q6H   magnesium oxide  400 mg Oral BID   oxyCODONE  60 mg Oral Q12H   polyethylene glycol  17 g Oral BID    Continuous Infusions:   PRN Meds: acetaminophen **OR** acetaminophen, HYDROmorphone (DILAUDID) injection, HYDROmorphone  Physical Exam         Awake alert No acute distress Abdomen mildly distended Regular work of breathing Trace edema No focal deficits  Vital Signs: BP (!) 108/48 (BP Location: Left Arm)   Pulse 77   Temp 98.1 F (36.7 C) (Oral)   Resp 14   Ht 5\' 4"  (1.626 m)   Wt 89.4 kg   LMP  (LMP Unknown)   SpO2 94%   BMI 33.81 kg/m  SpO2: SpO2: 94 % O2 Device: O2 Device: Nasal Cannula O2 Flow Rate: O2 Flow Rate (L/min): 3 L/min  Intake/output summary:  Intake/Output Summary (Last 24 hours) at 12/26/2022 1135 Last data filed at 12/25/2022 1756 Gross per 24 hour  Intake 3.5 ml  Output --  Net 3.5 ml   LBM: Last BM Date : 12/24/22 Baseline Weight: Weight: 89.4  kg Most recent weight: Weight: 89.4 kg       Palliative Assessment/Data: Palliative performance scale 50%.     Patient Active Problem List   Diagnosis Date Noted   Cancer associated pain 12/24/2022   Peritoneal carcinomatosis (HCC) 12/15/2022   Abdominal pain 12/15/2022   Malignant neoplasm of ovary (HCC) 12/15/2022   Nausea with vomiting 02/11/2022   Mixed stress and urge urinary  incontinence 08/02/2021   Microscopic hematuria 08/02/2021   Palliative care by specialist    Goals of care, counseling/discussion    General weakness    Leukopenia 06/23/2021   Thrombocytopenia (HCC) 06/23/2021   Elevated d-dimer 06/23/2021   Low back pain 06/23/2021   Hiatal hernia 06/23/2021   Obesity (BMI 30-39.9) 06/23/2021   Acute bronchospasm 06/23/2021   CAP (community acquired pneumonia) 06/22/2021   GERD (gastroesophageal reflux disease)    Iron deficiency anemia 11/07/2020   Post-op pain 07/12/2020   Splenic lesion 07/12/2020   Status post total replacement of left hip 06/05/2020   Primary osteoarthritis of left hip 02/09/2020   Pain in right knee 02/09/2020   Palliative care patient 09/18/2018   Port-A-Cath in place 08/20/2018   Genetic testing 08/20/2018   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   Splenic laceration 07/31/2018   Family history of uterine cancer    Family history of breast cancer    Macrocytic anemia 07/18/2018   Ovarian cancer, bilateral (HCC) 07/18/2018   HCAP (healthcare-associated pneumonia) 07/18/2018   Pleural effusion 07/07/2018   Migraine 07/06/2018   Pleural effusion on left 07/06/2018   Acute respiratory failure with hypoxia (HCC) 07/06/2018   Tobacco abuse 07/06/2018   Generalized anxiety disorder 05/02/2014   Constipation 08/10/2013   Lumbosacral spondylosis without myelopathy 10/23/2011   Intractable migraine without aura 10/23/2011    Palliative Care Assessment & Plan   Patient Profile:     Assessment: 52 year old lady patient of Dr. Ellin Saba, ovarian cancer with peritoneal metastases Uncontrolled abdominal pain for the past 2 weeks Declining functional status for the past 2 weeks Status post ultrasound-guided paracentesis and 2 L of clear fluid removed on 12-25-2022 Constipation Recommendations/Plan: Palliative consultation for pain   and non-- pain symptom management as well as broad goals of care discussions has been requested.  Patient initially placed on hydromorphone PCA for improved pain control. Discussed with patient about pain management.  She is on scheduled OxyContin 60 mg twice daily, discussed about opioid rotation from short acting oxycodone 30 mg every 4 hourly to oral hydromorphone 4-6 mg every 4 hours on an as-needed basis.  Continue current bowel regimen.  Monitor for relief from constipation.  At patient request, sent a secure chat to oncology colleague Dr. Ellin Saba informing him about the patient's current condition.  Additionally, patient wishes to go home with palliative services for now, but wishes to discuss further with her primary oncologist with regards to whether or not to proceed with hospice in the near future and whether or not to discontinue chemotherapy in the near future.  She values effective pain management and she values quality of life.  She has a disabled son at home.  She states she does not want to " give up too soon" but also wants to avoid suffering.  Has a CODE STATUS of DNR-limited. Plan: Continue scheduled OxyContin, continue current bowel regimen, monitor how the patient does coming off of the hydromorphone PCA pump, monitor how the patient does on oral hydromorphone.  If symptoms are managed, could recommend discharge within the next 48 hours or so.  PMT to follow.  Code Status:    Code Status Orders  (From admission, onward)           Start     Ordered   12/24/22 2029  Do not attempt resuscitation (DNR)- Limited -Do Not  Intubate (DNI)  Continuous       Question Answer Comment  If pulseless and  not breathing No CPR or chest compressions.   In Pre-Arrest Conditions (Patient Is Breathing and Has A Pulse) Do not intubate. Provide all appropriate non-invasive medical interventions. Avoid ICU transfer unless indicated or required.   Consent: Discussion documented in EHR or advanced directives reviewed      12/24/22 2029           Code Status History     Date Active Date Inactive Code Status Order ID Comments User Context   12/15/2022 1909 12/16/2022 0802 Limited: Do not attempt resuscitation (DNR) -DNR-LIMITED -Do Not Intubate/DNI  [id number]  Briscoe Burns, MD ED   06/23/2021 0038 06/26/2021 1848 Full Code [id number]  Frankey Shown, DO ED   06/05/2020 1607 06/06/2020 1706 Full Code [id number]  Tarry Kos, MD Inpatient   12/29/2018 0532 12/31/2018 0315 Full Code [id number]  Doylene Bode, NP Inpatient   07/31/2018 2026 08/04/2018 1933 Full Code [id number]  Pearson Grippe, MD ED   07/18/2018 2247 07/21/2018 1847 Full Code [id number]  Levie Heritage, DO Inpatient   07/06/2018 2208 07/16/2018 2116 Full Code [id number]  Lorretta Harp, MD ED       Prognosis:  Unable to determine  Discharge Planning: Home with Palliative Services  Care plan was discussed with patient and daughter. As per patient request, secure chat also sent to Dr. Ellin Saba patient's primary oncologist at [facility].  Thank you for allowing the Palliative Medicine Team to assist in the care of this patient.  Mod MDM Greater than 50%  of this time was spent counseling and coordinating care related to the above assessment and plan.  Rosalin Hawking, MD  Please contact Palliative Medicine Team phone at [phone] for questions and concerns.

## 2022-12-26 NOTE — Plan of Care (Signed)

## 2022-12-27 DIAGNOSIS — G893 Neoplasm related pain (acute) (chronic): Secondary | ICD-10-CM | POA: Diagnosis not present

## 2022-12-27 LAB — BASIC METABOLIC PANEL
Anion gap: 11 (ref 5–15)
BUN: 30 mg/dL — ABNORMAL HIGH (ref 6–20)
CO2: 26 mmol/L (ref 22–32)
Calcium: 8.1 mg/dL — ABNORMAL LOW (ref 8.9–10.3)
Chloride: 92 mmol/L — ABNORMAL LOW (ref 98–111)
Creatinine, Ser: 0.85 mg/dL (ref 0.44–1.00)
GFR, Estimated: >60 mL/min (ref >60)
Glucose, Bld: 105 mg/dL — ABNORMAL HIGH (ref 70–99)
Potassium: 4.1 mmol/L (ref 3.5–5.1)
Sodium: 129 mmol/L — ABNORMAL LOW (ref 135–145)

## 2022-12-27 MED ORDER — ONDANSETRON HCL 4 MG/2ML IJ SOLN
4.0000 mg | Freq: Four times a day (QID) | INTRAMUSCULAR | Status: DC | PRN
  Administered 2022-12-27 – 2022-12-30 (×9): 4 mg via INTRAVENOUS
  Filled 2022-12-27 (×9): qty 2

## 2022-12-27 NOTE — Plan of Care (Signed)

## 2022-12-27 NOTE — Progress Notes (Signed)
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 female with medical history significant of ovarian CA with peritoneal metastasis not on chemo presented to hospital with uncontrolled abdominal pain since Thanksgiving.  Patient was admitted earlier this month at [facility] on 12/15/2022 but left AGAINST MEDICAL ADVICE after being not being adequately controlled and his staff having issues with PCA.  Patient denied any fever but complains of diffuse abdominal pain with distention.  Of note patient was given increased dose of OxyContin this past week.    Assessment and plan.  Cancer associated pain Patient is on oxycontin 60mg  BID scheduled at home.  Has been continued.  On Toradol 30 mg every 6 hours. Patient was on on Dilaudid PCA pump with improved pain control  palliative care has discontinued PCA pump and transitioned to Dilaudid IV Q4 hourly and orally every 6hrly.  Did have 2 episodes of severe abdominal pain yesterday..Palliative care on board and will await recommendations on finalizing the opiate regimen on discharge.  Patient wished to discuss about palliative care/ hospice and  oncology to follow the patient as outpatient.  Patient underwent  paracentesis as well during hospitalization.  Follows up with Dr. Ellin Saba oncology as outpatient and her next follow-up  coming Tuesday.  Had missed chemotherapy due to being in the hospital.  Currently on room air.  Urinalysis was negative.  Lipase was negative.   Ovarian cancer, bilateral with Malignant ascites Peritoneal metastasis with significant abdominal pain.  Oncology aware of the patient in the hospital.  Palliative care adjusting pain medication.  Status post paracentesis.  Still complains of intermittent severe abdominal pain.    Generalized anxiety disorder Cont Effexor.  Valium has been on hold while on PCA opiates.  Mild  hyponatremia.  Asymptomatic.  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  Closely monitor.  Sodium level of 129 from 130 today.  Grade I obesity. Body mass index is 33.81 kg/m.  Would benefit from ongoing weight loss as outpatient.      DVT prophylaxis: enoxaparin (LOVENOX) injection 40 mg Start: 12/24/22 2200   Code Status:     Code Status: Limited: Do not attempt resuscitation (DNR) -DNR-LIMITED -Do Not Intubate/DNI   Disposition: Home likely in 1 to 2 days when pain is adequately controlled and okay with palliative care..  Status is: Inpatient The patient is inpatient because: pending adequate pain control, IV narcotics   Family Communication: Spoke with the patient's daughter at bedside on 12/26/2022  Consultants:  Oncology notified,  palliative care   Procedures:  PCA pump  Antimicrobials:  None  Anti-infectives (From admission, onward)    None        Subjective: Today, patient was seen and examined at bedside.  States that she has been having some thick stool from few times overnight and could not rest much and had sharp severe pain waking her up in the nighttime.  Denies increasing shortness of breath cough fevers chills or rigor.  Objective: Vitals:   12/26/22 2135 12/27/22 0610 12/27/22 0626 12/27/22 0628  BP: (!) 97/50 (!) 87/47 (!) 105/48 (!) 105/48  Pulse: 83 83 79 79  Resp: 18 18    Temp: 98.1 F (36.7 C) 98 F (36.7 C)    TempSrc: Oral Oral    SpO2: 98% 93%    Weight:      Height:  Intake/Output Summary (Last 24 hours) at 12/27/2022 1024 Last data filed at 12/27/2022 0000 Gross per 24 hour  Intake 360 ml  Output --  Net 360 ml   Filed Weights   12/24/22 1353  Weight: 89.4 kg    Physical Examination: Body mass index is 33.81 kg/m.   General: Obese built, not in obvious distress,  HENT:   No scleral pallor or icterus noted. Oral mucosa is moist.  Chest:  Clear breath sounds.  Right chest wall Port-A-Cath in place. CVS: S1 &S2  heard. No murmur.  Regular rate and rhythm. Abdomen: Soft, mild lower abdominal tenderness on palpation with mild distention  Extremities: No cyanosis, clubbing or edema.  Peripheral pulses are palpable. Psych: Alert, awake and oriented,  CNS:  No cranial nerve deficits.  Power equal in all extremities.   Skin: Warm and dry.  No rashes noted.  Data Reviewed:   CBC: Recent Labs  Lab 12/24/22 1431 12/25/22 0500 12/26/22 1011  WBC 11.2* 9.9 9.2  NEUTROABS 9.0*  --   --   HGB 12.5 11.2* 10.2*  HCT 36.4 33.4* 31.0*  MCV 109.3* 111.7* 112.3*  PLT 494* 476* 482*    Basic Metabolic Panel: Recent Labs  Lab 12/24/22 1431 12/25/22 0500 12/26/22 1011 12/27/22 0611  NA 129* 126* 130* 129*  K 4.3 4.0 4.3 4.1  CL 88* 87* 91* 92*  CO2 27 28 28 26   GLUCOSE 136* 102* 105* 105*  BUN 22* 25* 30* 30*  CREATININE 0.93 1.21* 1.06* 0.85  CALCIUM 8.8* 8.5* 8.0* 8.1*  MG  --   --  2.8*  --     Liver Function Tests: Recent Labs  Lab 12/24/22 1431  AST 43*  ALT 40  ALKPHOS 356*  BILITOT 0.4  PROT 7.5  ALBUMIN 2.7*     Radiology Studies: No results found.     LOS: 2 days     Joycelyn Das, MD [facility] Available via Epic secure chat 7am-7pm After these hours, please refer to coverage provider listed on [email or web address] 12/27/2022, 10:24 AM

## 2022-12-27 NOTE — Plan of Care (Signed)

## 2022-12-28 DIAGNOSIS — G893 Neoplasm related pain (acute) (chronic): Secondary | ICD-10-CM | POA: Diagnosis not present

## 2022-12-28 DIAGNOSIS — K5903 Drug induced constipation: Secondary | ICD-10-CM | POA: Diagnosis not present

## 2022-12-28 DIAGNOSIS — F411 Generalized anxiety disorder: Secondary | ICD-10-CM | POA: Diagnosis not present

## 2022-12-28 DIAGNOSIS — C563 Malignant neoplasm of bilateral ovaries: Secondary | ICD-10-CM | POA: Diagnosis not present

## 2022-12-28 DIAGNOSIS — C569 Malignant neoplasm of unspecified ovary: Secondary | ICD-10-CM | POA: Diagnosis not present

## 2022-12-28 MED ORDER — HYDROMORPHONE HCL 2 MG/ML IJ SOLN: 2.0000 mg | INTRAMUSCULAR | Status: DC | PRN

## 2022-12-28 MED ORDER — HYDROMORPHONE HCL 4 MG PO TABS
8.0000 mg | ORAL_TABLET | ORAL | Status: DC | PRN
  Administered 2022-12-28 – 2022-12-30 (×7): 8 mg via ORAL
  Filled 2022-12-28 (×9): qty 2

## 2022-12-28 MED ORDER — OXYCODONE HCL ER 40 MG PO T12A
60.0000 mg | EXTENDED_RELEASE_TABLET | Freq: Three times a day (TID) | ORAL | Status: DC
  Administered 2022-12-28 – 2022-12-30 (×6): 60 mg via ORAL
  Filled 2022-12-28 (×6): qty 1

## 2022-12-28 NOTE — Progress Notes (Signed)
PROGRESS NOTE    Michelle Holt  HQI:[id number] DOB: Oct 24, 1970 DOA: 12/24/2022 PCP: Ignatius Specking, MD    Brief Narrative:    Michelle Holt is a 52 y.o. female with medical history significant of ovarian CA with peritoneal metastasis not on chemo presented to hospital with uncontrolled abdominal pain since Thanksgiving.  Patient was admitted earlier this month at [facility] on 12/15/2022 but left AGAINST MEDICAL ADVICE after being not being adequately controlled and his staff having issues with PCA.  Patient denied any fever but complains of diffuse abdominal pain with distention.  Of note patient was given increased dose of OxyContin this past week.    Assessment and plan.  Cancer associated pain Patient is on oxycontin 60mg  BID scheduled at home.  Has been continued during hospitalization including IV Toradol.   Patient was on on Dilaudid PCA pump initially which has been transitioned to oral and IV Dilaudid.  Currently on IV every 4 hourly of Dilaudid but with inadequate control so we will change to every 3 hourly.  Continue oral Dilaudid as well.  Add K-pad.  Palliative care on board and will await recommendations on finalizing the opiate regimen on discharge.  Patient  underwent  paracentesis as well during hospitalization.  Follows up with Dr. Ellin Saba oncology as outpatient and her next follow-up  coming Tuesday.  Had missed chemotherapy due to being in the hospital.  Currently on room air.  Urinalysis was negative.  Lipase was negative.   Ovarian cancer, bilateral with Malignant ascites Peritoneal metastasis with significant abdominal pain.  Oncology aware of the patient in the hospital.  Palliative care adjusting pain medication.  Status post paracentesis.  Still complains of intermittent severe abdominal pain.  Will adjust the doses of IV Dilaudid today.  Add K-pad.  Generalized anxiety disorder Cont Effexor.  Valium has been on hold while on PCA opiates.  Mild hyponatremia.   Asymptomatic.  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  Closely monitor.  Sodium level of 129 from 130 today.  Grade I obesity. Body mass index is 33.81 kg/m.  Would benefit from ongoing weight loss as outpatient.      DVT prophylaxis: enoxaparin (LOVENOX) injection 40 mg Start: 12/24/22 2200   Code Status:     Code Status: Limited: Do not attempt resuscitation (DNR) -DNR-LIMITED -Do Not Intubate/DNI   Disposition: Home likely in 1 to 2 days when pain is adequately controlled and okay with palliative care..  Status is: Inpatient The patient is inpatient because: pending adequate pain control, IV narcotics   Family Communication: Spoke with the patient's daughter at bedside on 12/28/2022  Consultants:  Oncology notified,  palliative care   Procedures:  PCA pump  Antimicrobials:  None  Anti-infectives (From admission, onward)    None       Subjective: Today, patient was seen and examined at bedside.  Patient states that she has been having more pain today than yesterday and states that the medicines are not really helping her much.  Did have some few sticky bowel movements yesterday.  Denies any nausea vomiting fever.  Did not eat much yesterday.  Patient's daughter at bedside.  Objective: Vitals:   12/27/22 1422 12/27/22 2023 12/28/22 0608 12/28/22 1326  BP: (!) 88/53 (!) 97/49 (!) 96/44 (!) 96/53  Pulse: 86 88 79 81  Resp: 18 18 18    Temp: 98.2 F (36.8 C) 97.8 F (36.6 C) 98.4 F (36.9 C) 98 F (36.7 C)  TempSrc: Oral Oral Oral  Oral  SpO2: (!) 89% 90% 98% 91%  Weight:      Height:        Intake/Output Summary (Last 24 hours) at 12/28/2022 1446 Last data filed at 12/28/2022 0200 Gross per 24 hour  Intake 360 ml  Output 1 ml  Net 359 ml   Filed Weights   12/24/22 1353  Weight: 89.4 kg    Physical Examination: Body mass index is 33.81 kg/m.   General: Obese built, not in obvious distress, alert awake and Communicative.  Anxious HENT:   No scleral  pallor or icterus noted. Oral mucosa is moist.  Chest: Diminished breath sounds bilaterally.  Right chest wall Port-A-Cath in place. CVS: S1 &S2 heard. No murmur.  Regular rate and rhythm. Abdomen: Soft, mild lower abdominal tenderness on palpation with mild distention  Extremities: No cyanosis, clubbing or edema.  Peripheral pulses are palpable. Psych: Alert, awake and oriented, mildly anxious CNS:  No cranial nerve deficits.  Power equal in all extremities.   Skin: Warm and dry.  No rashes noted.  Data Reviewed:   CBC: Recent Labs  Lab 12/24/22 1431 12/25/22 0500 12/26/22 1011  WBC 11.2* 9.9 9.2  NEUTROABS 9.0*  --   --   HGB 12.5 11.2* 10.2*  HCT 36.4 33.4* 31.0*  MCV 109.3* 111.7* 112.3*  PLT 494* 476* 482*    Basic Metabolic Panel: Recent Labs  Lab 12/24/22 1431 12/25/22 0500 12/26/22 1011 12/27/22 0611  NA 129* 126* 130* 129*  K 4.3 4.0 4.3 4.1  CL 88* 87* 91* 92*  CO2 27 28 28 26   GLUCOSE 136* 102* 105* 105*  BUN 22* 25* 30* 30*  CREATININE 0.93 1.21* 1.06* 0.85  CALCIUM 8.8* 8.5* 8.0* 8.1*  MG  --   --  2.8*  --     Liver Function Tests: Recent Labs  Lab 12/24/22 1431  AST 43*  ALT 40  ALKPHOS 356*  BILITOT 0.4  PROT 7.5  ALBUMIN 2.7*     Radiology Studies: No results found.     LOS: 3 days     Joycelyn Das, MD [facility] Available via Epic secure chat 7am-7pm After these hours, please refer to coverage provider listed on [email or web address] 12/28/2022, 2:46 PM

## 2022-12-28 NOTE — Plan of Care (Signed)

## 2022-12-29 DIAGNOSIS — K5903 Drug induced constipation: Secondary | ICD-10-CM

## 2022-12-29 DIAGNOSIS — G893 Neoplasm related pain (acute) (chronic): Secondary | ICD-10-CM | POA: Diagnosis not present

## 2022-12-29 DIAGNOSIS — T402X5A Adverse effect of other opioids, initial encounter: Secondary | ICD-10-CM

## 2022-12-29 DIAGNOSIS — C563 Malignant neoplasm of bilateral ovaries: Secondary | ICD-10-CM | POA: Diagnosis not present

## 2022-12-29 DIAGNOSIS — C569 Malignant neoplasm of unspecified ovary: Secondary | ICD-10-CM | POA: Diagnosis not present

## 2022-12-29 MED ORDER — TRAZODONE HCL 50 MG PO TABS: 100.0000 mg | ORAL_TABLET | Freq: Every day | ORAL | Status: DC

## 2022-12-29 MED ORDER — SENNOSIDES-DOCUSATE SODIUM 8.6-50 MG PO TABS
2.0000 | ORAL_TABLET | Freq: Two times a day (BID) | ORAL | Status: DC
  Administered 2022-12-29: 2 via ORAL
  Filled 2022-12-29: qty 2

## 2022-12-29 MED ORDER — METHYLNALTREXONE BROMIDE 12 MG/0.6ML ~~LOC~~ SOLN
12.0000 mg | SUBCUTANEOUS | Status: DC
  Administered 2022-12-29: 12 mg via SUBCUTANEOUS
  Filled 2022-12-29: qty 0.6

## 2022-12-29 MED ORDER — DIAZEPAM 5 MG PO TABS
5.0000 mg | ORAL_TABLET | Freq: Every day | ORAL | Status: DC
  Administered 2022-12-29: 5 mg via ORAL
  Filled 2022-12-29: qty 1

## 2022-12-29 MED ORDER — DIAZEPAM 5 MG PO TABS: 10.0000 mg | ORAL_TABLET | Freq: Every day | ORAL | Status: DC

## 2022-12-29 NOTE — Progress Notes (Addendum)
PROGRESS NOTE    Michelle Holt  MVH:[id number] DOB: 03/25/1970 DOA: 12/24/2022 PCP: Ignatius Specking, MD    Brief Narrative:    Michelle Holt is a 52 y.o. female with medical history significant of ovarian CA with peritoneal metastasis not on chemo presented to hospital with uncontrolled abdominal pain since Thanksgiving.  Patient was admitted earlier this month at [facility] on 12/15/2022 but left AGAINST MEDICAL ADVICE after being not being adequately controlled and his staff having issues with PCA.  Patient denied any fever but complains of diffuse abdominal pain with distention.  Of note patient was given increased dose of OxyContin this past week.  Patient was then admitted hospital for adequate pain management.  Assessment and plan.  Tractable cancer associated pain Patient was on oxycontin 60mg  BID scheduled at home.  This has been changed to 3 times daily as per palliative care..  Patient was initially on Dilaudid pump which has been transitioned to IV and oral Dilaudid.  Dose of Dilaudid has increased today and did not require IV this morning.. She however does not feel too well with sluggishness and weakness.  K-pad seems to help as well, we will continue.  Palliative care on board  will await recommendations on finalizing the opiate regimen on discharge.  Patient  underwent  paracentesis as well during hospitalization.  Follows up with Dr. Ellin Saba oncology as outpatient and her next follow-up  coming Tuesday.  Had missed chemotherapy due to being in the hospital.  She strongly wishes to be able to get the next chemotherapy on Tuesday.  Currently on room air.     Ovarian cancer, bilateral with Malignant ascites Peritoneal metastasis with significant abdominal pain.  Oncology aware of the patient in the hospital.  Urinalysis was negative.  Lipase was negative.  Status post paracentesis.  Palliative care adjusting doses of narcotics.  Follow palliative care regarding recommendations on  discharge.    Generalized anxiety disorder Cont Effexor.  Valium has been initiated for sleep at nighttime as per palliative care.  Mild hyponatremia.  Asymptomatic.  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  Closely monitor.  Latest sodium level of 129   Grade I obesity. Body mass index is 33.81 kg/m.  Would benefit from ongoing weight loss as outpatient.      DVT prophylaxis: enoxaparin (LOVENOX) injection 40 mg Start: 12/24/22 2200   Code Status:     Code Status: Limited: Do not attempt resuscitation (DNR) -DNR-LIMITED -Do Not Intubate/DNI   Disposition: Home likely in 1 to 2 days when pain is adequately controlled and okay with palliative care..  Status is: Inpatient The patient is inpatient because: pending adequate pain control, IV narcotics   Family Communication: Spoke with the patient's daughter at bedside on 12/28/2022  Consultants:  Oncology notified,  palliative care   Procedures:  PCA pump  Antimicrobials:  None  Anti-infectives (From admission, onward)    None       Subjective: Today, patient was seen and examined at bedside.  Patient states that she has not had a bowel movement since day before and does not wish to be on a strong laxative but I have encouraged lactulose.  Still has some pain but did not require IV narcotics.  Palliative care care has adjusted doses of long-acting and oral Dilaudid.  States that she could not sleep the whole night and feels sleepy and tired this morning.   Objective: Vitals:   12/28/22 8413 12/28/22 1326 12/28/22 2019 12/29/22 0545  BP: (!) 96/44 (!) 96/53 (!) 108/52 (!) 94/57  Pulse: 79 81 86 86  Resp: 18  18 18   Temp: 98.4 F (36.9 C) 98 F (36.7 C) 97.8 F (36.6 C) 98 F (36.7 C)  TempSrc: Oral Oral Oral Oral  SpO2: 98% 91% 94% 93%  Weight:      Height:       No intake or output data in the 24 hours ending 12/29/22 1043  Filed Weights   12/24/22 1353  Weight: 89.4 kg    Physical Examination: Body  mass index is 33.81 kg/m.   General: Obese built, anxious, Communicative but intermittently sleepy, appears weak HENT:   No scleral pallor or icterus noted. Oral mucosa is moist.  Chest: Diminished breath sounds bilaterally.  Right chest wall Port-A-Cath in place. CVS: S1 &S2 heard. No murmur.  Regular rate and rhythm. Abdomen: Soft, diffuse  abdominal tenderness on palpation with mild distention, tenderness more over the epigastric region Extremities: No cyanosis, clubbing or edema.  Peripheral pulses are palpable. Psych: Alert, awake and oriented anxious, CNS:  No cranial nerve deficits.  Power equal in all extremities.   Skin: Warm and dry.  No rashes noted.  Data Reviewed:   CBC: Recent Labs  Lab 12/24/22 1431 12/25/22 0500 12/26/22 1011  WBC 11.2* 9.9 9.2  NEUTROABS 9.0*  --   --   HGB 12.5 11.2* 10.2*  HCT 36.4 33.4* 31.0*  MCV 109.3* 111.7* 112.3*  PLT 494* 476* 482*    Basic Metabolic Panel: Recent Labs  Lab 12/24/22 1431 12/25/22 0500 12/26/22 1011 12/27/22 0611  NA 129* 126* 130* 129*  K 4.3 4.0 4.3 4.1  CL 88* 87* 91* 92*  CO2 27 28 28 26   GLUCOSE 136* 102* 105* 105*  BUN 22* 25* 30* 30*  CREATININE 0.93 1.21* 1.06* 0.85  CALCIUM 8.8* 8.5* 8.0* 8.1*  MG  --   --  2.8*  --     Liver Function Tests: Recent Labs  Lab 12/24/22 1431  AST 43*  ALT 40  ALKPHOS 356*  BILITOT 0.4  PROT 7.5  ALBUMIN 2.7*     Radiology Studies: No results found.     LOS: 4 days     Joycelyn Das, MD [facility] Available via Epic secure chat 7am-7pm After these hours, please refer to coverage provider listed on [email or web address] 12/29/2022, 10:43 AM

## 2022-12-29 NOTE — Plan of Care (Signed)
  Problem: Education: Goal: Knowledge of General Education information will improve Description: Including pain rating scale, medication(s)/side effects and non-pharmacologic comfort measures Outcome: Progressing   Problem: Health Behavior/Discharge Planning: Goal: Ability to manage health-related needs will improve Outcome: Progressing   Problem: Clinical Measurements: Goal: Ability to maintain clinical measurements within normal limits will improve Outcome: Progressing Goal: Will remain free from infection Outcome: Progressing Goal: Diagnostic test results will improve Outcome: Progressing Goal: Respiratory complications will improve Outcome: Progressing Goal: Cardiovascular complication will be avoided Outcome: Progressing   Problem: Activity: Goal: Risk for activity intolerance will decrease Outcome: Progressing   Problem: Coping: Goal: Level of anxiety will decrease Outcome: Progressing   Problem: Elimination: Goal: Will not experience complications related to bowel motility Outcome: Progressing Goal: Will not experience complications related to urinary retention Outcome: Progressing   Problem: Pain Management: Goal: General experience of comfort will improve Outcome: Progressing   Problem: Safety: Goal: Ability to remain free from injury will improve Outcome: Progressing   Problem: Skin Integrity: Goal: Risk for impaired skin integrity will decrease Outcome: Progressing

## 2022-12-29 NOTE — Progress Notes (Signed)
Daily Progress Note   Patient Name: BAKER YAUCH       Date: 12/29/2022 DOB: July 21, 1970  Age: 52 y.o. MRN#: [id number] Attending Physician: Joycelyn Das, MD Primary Care Physician: Ignatius Specking, MD Admit Date: 12/24/2022  Reason for Consultation/Follow-up: Establishing goals of care, Non pain symptom management, and Pain control  Subjective:  I saw and examined [name] today. Much better with TID oxycodone XR modification yesterday. Still no BM. Has insomnia-reports she has been on valium for 20 years.  Length of Stay: 4  Current Medications: Scheduled Meds:   Chlorhexidine Gluconate Cloth  6 each Topical Daily   diazepam  5 mg Oral QHS   enoxaparin (LOVENOX) injection  40 mg Subcutaneous Q24H   methylnaltrexone  12 mg Subcutaneous Q48H   oxyCODONE  60 mg Oral Q8H    Continuous Infusions:   PRN Meds: acetaminophen **OR** acetaminophen, HYDROmorphone (DILAUDID) injection, HYDROmorphone, ondansetron (ZOFRAN) IV  Physical Exam         Awake alert No acute distress Abdomen mildly distended Regular work of breathing Trace edema No focal deficits  Vital Signs: BP (!) 94/57 (BP Location: Left Arm)   Pulse 86   Temp 98 F (36.7 C) (Oral)   Resp 18   Ht 5\' 4"  (1.626 m)   Wt 89.4 kg   LMP  (LMP Unknown)   SpO2 93%   BMI 33.81 kg/m  SpO2: SpO2: 93 % O2 Device: O2 Device: Room Air O2 Flow Rate: O2 Flow Rate (L/min): 3 L/min  Intake/output summary: No intake or output data in the 24 hours ending 12/29/22 1153  LBM: Last BM Date : 12/27/22 Baseline Weight: Weight: 89.4 kg Most recent weight: Weight: 89.4 kg       Palliative Assessment/Data: Palliative performance scale 50%.     Patient Active Problem List   Diagnosis Date Noted   Cancer associated  pain 12/24/2022   Peritoneal carcinomatosis (HCC) 12/15/2022   Abdominal pain 12/15/2022   Malignant neoplasm of ovary (HCC) 12/15/2022   Nausea with vomiting 02/11/2022   Mixed stress and urge urinary incontinence 08/02/2021   Microscopic hematuria 08/02/2021   Palliative care by specialist    Goals of care, counseling/discussion    General weakness    Leukopenia 06/23/2021   Thrombocytopenia (HCC) 06/23/2021   Elevated d-dimer 06/23/2021  Low back pain 06/23/2021   Hiatal hernia 06/23/2021   Obesity (BMI 30-39.9) 06/23/2021   Acute bronchospasm 06/23/2021   CAP (community acquired pneumonia) 06/22/2021   GERD (gastroesophageal reflux disease)    Iron deficiency anemia 11/07/2020   Post-op pain 07/12/2020   Splenic lesion 07/12/2020   Status post total replacement of left hip 06/05/2020   Primary osteoarthritis of left hip 02/09/2020   Pain in right knee 02/09/2020   Palliative care patient 09/18/2018   Port-A-Cath in place 08/20/2018   Genetic testing 08/20/2018   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   Splenic laceration 07/31/2018   Family history of uterine cancer    Family history of breast cancer    Macrocytic anemia 07/18/2018   Ovarian cancer, bilateral (HCC) 07/18/2018   HCAP (healthcare-associated pneumonia) 07/18/2018   Pleural effusion 07/07/2018   Migraine 07/06/2018   Pleural effusion on left 07/06/2018   Acute respiratory failure with hypoxia (HCC) 07/06/2018   Tobacco abuse 07/06/2018   Generalized anxiety disorder 05/02/2014   Constipation 08/10/2013   Lumbosacral spondylosis without myelopathy 10/23/2011   Intractable migraine without aura 10/23/2011    Palliative Care Assessment & Plan     Assessment: 52 year old lady patient of Dr. Ellin Saba, ovarian cancer with peritoneal metastases Uncontrolled abdominal pain for the past 2 weeks Declining functional status for the past 2 weeks Status post ultrasound-guided  paracentesis and 2 L of clear fluid removed on 12-25-2022 Constipation Recommendations/Plan: 12/14 Pain, cancer related: In the past 24 hours she has had 8 mg of IV Dilaudid, 30 mg of p.o. Dilaudid, and 60 mg twice daily of OxyContin.  This turns out to a total oral morphine equivalent of roughly 460 mg.  Will plan to increase her OxyContin to 3 times daily and increase oral breakthrough medication to Dilaudid 8 mg p.o. every 3 hours as needed with a backup of IV Dilaudid 2 mg to be given 45 minutes after oral medication of oral medication is insufficient to relieve pain.  I did discuss with her that I am concerned that this still may not control her pain and we may need to rotate opioids.  I recommended methadone as far as being the best option to consider at this point.  Will plan to get an EKG in order to check QTc in case we need to go this route. 12/15 EKG ok Doing better-maintain TID Oxycodone Constipation, opioid related: Has failed traditional laxatives. Will give Relistor today. Insomnia: start valium 5mg  at bedtime. Code Status:    Code Status Orders  (From admission, onward)           Start     Ordered   12/24/22 2029  Do not attempt resuscitation (DNR)- Limited -Do Not Intubate (DNI)  Continuous       Question Answer Comment  If pulseless and not breathing No CPR or chest compressions.   In Pre-Arrest Conditions (Patient Is Breathing and Has A Pulse) Do not intubate. Provide all appropriate non-invasive medical interventions. Avoid ICU transfer unless indicated or required.   Consent: Discussion documented in EHR or advanced directives reviewed      12/24/22 2029           Code Status History     Date Active Date Inactive Code Status Order ID Comments User Context   12/15/2022 1909 12/16/2022 0802 Limited: Do not attempt resuscitation (DNR) -DNR-LIMITED -Do Not Intubate/DNI  [id number]  Briscoe Burns, MD ED   06/23/2021 0038 06/26/2021 1848 Full Code  [id number]  Frankey Shown, DO ED   06/05/2020 1607 06/06/2020 1706 Full Code [id number]  Tarry Kos, MD Inpatient   12/29/2018 0532 12/31/2018 0315 Full Code [id number]  Doylene Bode, NP Inpatient   07/31/2018 2026 08/04/2018 1933 Full Code [id number]  Pearson Grippe, MD ED   07/18/2018 2247 07/21/2018 1847 Full Code [id number]  Levie Heritage, DO Inpatient   07/06/2018 2208 07/16/2018 2116 Full Code [id number]  Lorretta Harp, MD ED       Prognosis:  Unable to determine  Discharge Planning: Home with Palliative Services  Care plan was discussed with patient and daughter. As per patient request, secure chat also sent to Dr. Ellin Saba patient's primary oncologist at [facility].  Thank you for allowing the Palliative Medicine Team to assist in the care of this patient.  High MDM  Total time: 55 minutes  Greater than 50%  of this time was spent counseling and coordinating care related to the above assessment and plan.  Anderson Malta, DO  Please contact Palliative Medicine Team phone at [phone] for questions and concerns.

## 2022-12-29 NOTE — Plan of Care (Signed)

## 2022-12-29 NOTE — Progress Notes (Addendum)
Daily Progress Note   Patient Name: Michelle Holt       Date: 12/29/2022 DOB: 01/27/1970  Age: 53 y.o. MRN#: [id number] Attending Physician: Joycelyn Das, MD Primary Care Physician: Ignatius Specking, MD Admit Date: 12/24/2022  Reason for Consultation/Follow-up: Establishing goals of care, Non pain symptom management, and Pain control  Subjective:  I saw and examined Michelle Holt today.  Her daughter was at bedside as well.  We reviewed her clinical course overnight and she reports she had a "horrible" night with uncontrolled pain again.  We discussed her overall opioid usage and talked about options for pain management moving forward.  She expressed preference at this point for trying to increase current medications to see if pain can get under better control with higher doses.  I told her that I thought that it was reasonable to try this today, however, if this is not successful then we will need to consider opioid rotation to a different opioid such as methadone.  She reports that as an outpatient she was taking her OxyContin 3 times per day and this seemed to do much better than current twice daily dosing.  In checking her PDMP, it is difficult to determine if dosing was twice daily versus 3 times daily as she only had 1 prescription filled at the beginning of this month.  Reports she has been having regular bowel movements and that she had "horrible time" after using MiraLAX.  States she is going to refuse her MiraLAX moving forward.  Length of Stay: 4  Current Medications: Scheduled Meds:   Chlorhexidine Gluconate Cloth  6 each Topical Daily   enoxaparin (LOVENOX) injection  40 mg Subcutaneous Q24H   ketorolac  30 mg Intravenous Q6H   oxyCODONE  60 mg Oral Q8H   polyethylene glycol   17 g Oral BID    Continuous Infusions:   PRN Meds: acetaminophen **OR** acetaminophen, bisacodyl, HYDROmorphone (DILAUDID) injection, HYDROmorphone, lactulose, ondansetron (ZOFRAN) IV  Physical Exam         Awake alert No acute distress Abdomen mildly distended Regular work of breathing Trace edema No focal deficits  Vital Signs: BP (!) 94/57 (BP Location: Left Arm)   Pulse 86   Temp 98 F (36.7 C) (Oral)   Resp 18   Ht 5\' 4"  (1.626 m)  Wt 89.4 kg   LMP  (LMP Unknown)   SpO2 93%   BMI 33.81 kg/m  SpO2: SpO2: 93 % O2 Device: O2 Device: Room Air O2 Flow Rate: O2 Flow Rate (L/min): 3 L/min  Intake/output summary: No intake or output data in the 24 hours ending 12/29/22 0859  LBM: Last BM Date : 12/27/22 Baseline Weight: Weight: 89.4 kg Most recent weight: Weight: 89.4 kg       Palliative Assessment/Data: Palliative performance scale 50%.     Patient Active Problem List   Diagnosis Date Noted   Cancer associated pain 12/24/2022   Ovarian cancer, bilateral (HCC) 07/18/2018   Generalized anxiety disorder 05/02/2014   Peritoneal carcinomatosis (HCC) 12/15/2022   Abdominal pain 12/15/2022   Malignant neoplasm of ovary (HCC) 12/15/2022   Nausea with vomiting 02/11/2022   Mixed stress and urge urinary incontinence 08/02/2021   Microscopic hematuria 08/02/2021   Palliative care by specialist    Goals of care, counseling/discussion    General weakness    Leukopenia 06/23/2021   Thrombocytopenia (HCC) 06/23/2021   Elevated d-dimer 06/23/2021   Low back pain 06/23/2021   Hiatal hernia 06/23/2021   Obesity (BMI 30-39.9) 06/23/2021   Acute bronchospasm 06/23/2021   CAP (community acquired pneumonia) 06/22/2021   GERD (gastroesophageal reflux disease)    Iron deficiency anemia 11/07/2020   Post-op pain 07/12/2020   Splenic lesion 07/12/2020   Status post total replacement of left hip 06/05/2020   Primary osteoarthritis of left hip 02/09/2020   Pain in right  knee 02/09/2020   Palliative care patient 09/18/2018   Port-A-Cath in place 08/20/2018   Genetic testing 08/20/2018   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   Splenic laceration 07/31/2018   Family history of uterine cancer    Family history of breast cancer    Macrocytic anemia 07/18/2018   HCAP (healthcare-associated pneumonia) 07/18/2018   Pleural effusion 07/07/2018   Migraine 07/06/2018   Pleural effusion on left 07/06/2018   Acute respiratory failure with hypoxia (HCC) 07/06/2018   Tobacco abuse 07/06/2018   Constipation 08/10/2013   Lumbosacral spondylosis without myelopathy 10/23/2011   Intractable migraine without aura 10/23/2011    Palliative Care Assessment & Plan   Patient Profile:    Assessment: 52 year old lady patient of Dr. Ellin Saba, ovarian cancer with peritoneal metastases Uncontrolled abdominal pain for the past 2 weeks Declining functional status for the past 2 weeks Status post ultrasound-guided paracentesis and 2 L of clear fluid removed on 12-25-2022 Constipation Recommendations/Plan: Pain, cancer related: In the past 24 hours she has had 8 mg of IV Dilaudid, 30 mg of p.o. Dilaudid, and 60 mg twice daily of OxyContin.  This turns out to a total oral morphine equivalent of roughly 460 mg.  Will plan to increase her OxyContin to 3 times daily and increase oral breakthrough medication to Dilaudid 8 mg p.o. every 3 hours as needed with a backup of IV Dilaudid 2 mg to be given 45 minutes after oral medication of oral medication is insufficient to relieve pain.  I did discuss with her that I am concerned that this still may not control her pain and we may need to rotate opioids.  I recommended methadone as far as being the best option to consider at this point.  Will plan to get an EKG in order to check QTc in case we need to go this route. Constipation, opioid related: She needs to continue on a bowel regimen but reports she is going to  refuse any further MiraLAX.  Will rotate to senna S2 tabs twice daily.  She reports she takes this medication at home. Code Status:    Code Status Orders  (From admission, onward)           Start     Ordered   12/24/22 2029  Do not attempt resuscitation (DNR)- Limited -Do Not Intubate (DNI)  Continuous       Question Answer Comment  If pulseless and not breathing No CPR or chest compressions.   In Pre-Arrest Conditions (Patient Is Breathing and Has A Pulse) Do not intubate. Provide all appropriate non-invasive medical interventions. Avoid ICU transfer unless indicated or required.   Consent: Discussion documented in EHR or advanced directives reviewed      12/24/22 2029           Code Status History     Date Active Date Inactive Code Status Order ID Comments User Context   12/15/2022 1909 12/16/2022 0802 Limited: Do not attempt resuscitation (DNR) -DNR-LIMITED -Do Not Intubate/DNI  [id number]  Briscoe Burns, MD ED   06/23/2021 0038 06/26/2021 1848 Full Code [id number]  Frankey Shown, DO ED   06/05/2020 1607 06/06/2020 1706 Full Code [id number]  Tarry Kos, MD Inpatient   12/29/2018 0532 12/31/2018 0315 Full Code [id number]  Doylene Bode, NP Inpatient   07/31/2018 2026 08/04/2018 1933 Full Code [id number]  Pearson Grippe, MD ED   07/18/2018 2247 07/21/2018 1847 Full Code [id number]  Levie Heritage, DO Inpatient   07/06/2018 2208 07/16/2018 2116 Full Code [id number]  Lorretta Harp, MD ED       Prognosis:  Unable to determine  Discharge Planning: Home with Palliative Services  Care plan was discussed with patient and daughter. As per patient request, secure chat also sent to Dr. Ellin Saba patient's primary oncologist at [facility].  Thank you for allowing the Palliative Medicine Team to assist in the care of this patient.  High MDM  Total time: 55 minutes  Greater than 50%  of this time was spent counseling and coordinating care related to the above assessment and plan.  Romie Minus, MD  Please contact Palliative Medicine Team phone at [phone] for questions and concerns.

## 2022-12-30 ENCOUNTER — Other Ambulatory Visit (HOSPITAL_COMMUNITY): Payer: Self-pay

## 2022-12-30 DIAGNOSIS — G893 Neoplasm related pain (acute) (chronic): Secondary | ICD-10-CM | POA: Diagnosis not present

## 2022-12-30 DIAGNOSIS — F411 Generalized anxiety disorder: Secondary | ICD-10-CM | POA: Diagnosis not present

## 2022-12-30 DIAGNOSIS — C563 Malignant neoplasm of bilateral ovaries: Secondary | ICD-10-CM | POA: Diagnosis not present

## 2022-12-30 LAB — BASIC METABOLIC PANEL
Anion gap: 10 (ref 5–15)
BUN: 32 mg/dL — ABNORMAL HIGH (ref 6–20)
CO2: 27 mmol/L (ref 22–32)
Calcium: 8.8 mg/dL — ABNORMAL LOW (ref 8.9–10.3)
Chloride: 96 mmol/L — ABNORMAL LOW (ref 98–111)
Creatinine, Ser: 1.09 mg/dL — ABNORMAL HIGH (ref 0.44–1.00)
GFR, Estimated: >60 mL/min (ref >60)
Glucose, Bld: 100 mg/dL — ABNORMAL HIGH (ref 70–99)
Potassium: 4.6 mmol/L (ref 3.5–5.1)
Sodium: 133 mmol/L — ABNORMAL LOW (ref 135–145)

## 2022-12-30 LAB — CBC
HCT: 34.8 % — ABNORMAL LOW (ref 36.0–46.0)
Hemoglobin: 11.5 g/dL — ABNORMAL LOW (ref 12.0–15.0)
MCH: 36.2 pg — ABNORMAL HIGH (ref 26.0–34.0)
MCHC: 33 g/dL (ref 30.0–36.0)
MCV: 109.4 fL — ABNORMAL HIGH (ref 80.0–100.0)
Platelets: 669 10*3/uL — ABNORMAL HIGH (ref 150–400)
RBC: 3.18 MIL/uL — ABNORMAL LOW (ref 3.87–5.11)
RDW: 15.8 % — ABNORMAL HIGH (ref 11.5–15.5)
WBC: 9.7 10*3/uL (ref 4.0–10.5)
nRBC: 0 % (ref 0.0–0.2)

## 2022-12-30 MED ORDER — SENNOSIDES-DOCUSATE SODIUM 8.6-50 MG PO TABS
2.0000 | ORAL_TABLET | Freq: Once | ORAL | Status: AC
  Administered 2022-12-30: 2 via ORAL
  Filled 2022-12-30: qty 2

## 2022-12-30 MED ORDER — OXYCODONE ER 36 MG PO C12A
36.0000 mg | EXTENDED_RELEASE_CAPSULE | Freq: Four times a day (QID) | ORAL | 0 refills | Status: DC
  Filled 2022-12-30: qty 60, 15d supply, fill #0

## 2022-12-30 MED ORDER — HEPARIN SOD (PORK) LOCK FLUSH 100 UNIT/ML IV SOLN
500.0000 [IU] | INTRAVENOUS | Status: DC | PRN
  Filled 2022-12-30: qty 5

## 2022-12-30 MED ORDER — NALOXEGOL OXALATE 25 MG PO TABS: 25.0000 mg | ORAL_TABLET | Freq: Every day | ORAL | 0 refills | Status: DC

## 2022-12-30 MED ORDER — OXYCODONE ER 36 MG PO C12A: 36.0000 mg | EXTENDED_RELEASE_CAPSULE | Freq: Four times a day (QID) | ORAL | 0 refills | Status: DC

## 2022-12-30 MED ORDER — HYDROMORPHONE HCL 8 MG PO TABS: 8.0000 mg | ORAL_TABLET | ORAL | 0 refills | Status: DC | PRN

## 2022-12-30 MED ORDER — NALOXEGOL OXALATE 25 MG PO TABS
25.0000 mg | ORAL_TABLET | Freq: Every day | ORAL | 0 refills | Status: DC
  Filled 2022-12-30: qty 30, 30d supply, fill #0

## 2022-12-30 NOTE — Progress Notes (Signed)
Dr. Phillips Odor ordered Xtampza 36 mg 4x/day for pain in place of Oxycotin, because her insurance did not cover Oxycotin. I called her [facility] pharmacy and they did not carry Xtampza. Dr Phillips Odor called and told it would have to be ordered at [facility]. The order was not received and Dr. Sharl Ma was called and it was sent in.Discharge instructions gone over with Michelle Holt. Prescriptions called in and she was discharged via wheelchair to her daughter.

## 2022-12-30 NOTE — Discharge Summary (Signed)
Physician Discharge Summary   Patient: Michelle Holt MRN: [id number] DOB: [birth year]  Admit date:     12/24/2022  Discharge date: 12/30/22  Discharge Physician: Meredeth Ide   PCP: Ignatius Specking, MD   Recommendations at discharge:   Follow-up PCP in 1 week Follow-up oncology in 1 week  Discharge Diagnoses: Principal Problem:   Cancer associated pain Active Problems:   Ovarian cancer, bilateral (HCC)   Generalized anxiety disorder  Resolved Problems:   * No resolved hospital problems. *  Hospital Course:  52 y.o. female with medical history significant of ovarian CA with peritoneal metastasis not on chemo presented to hospital with uncontrolled abdominal pain since Thanksgiving.  Patient was admitted earlier this month at [facility] on 12/15/2022 but left AGAINST MEDICAL ADVICE after being not being adequately controlled and his staff having issues with PCA.  Patient denied any fever but complains of diffuse abdominal pain with distention.  Of note patient was given increased dose of OxyContin this past week.  Patient was then admitted hospital for adequate pain management.   Assessment and Plan:  Intractable cancer associated pain Patient was on oxycontin 60mg  BID scheduled at home.  This has been changed to 3 times daily as per palliative care..  Patient was initially on Dilaudid pump which has been transitioned to IV and oral Dilaudid.  .  Follows up with Dr. Ellin Saba oncology as outpatient and her next follow-up  coming Tuesday.  Had missed chemotherapy due to being in the hospital.  She strongly wishes to be able to get the next chemotherapy on Tuesday.  Currently on room air.   -Palliative care has made adjustment in her pain medications.  She will be discharged on gabapentin 300 mg 3 times daily, oxycodone ER 36 mg 4 times a day, diazepam 5 mg daily at bedtime, Dilaudid 8 mg every 4 hours as needed for breakthrough pain, Movantik as needed for constipation   Ovarian cancer,  bilateral with Malignant ascites Peritoneal metastasis with significant abdominal pain.  Oncology aware of the patient in the hospital.  Urinalysis was negative.  Lipase was negative.  Status post paracentesis.   -Pain medication adjusted as above   Generalized anxiety disorder  Valium has been initiated for sleep at nighttime as per palliative care.   Mild hyponatremia.  Asymptomatic.  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  Closely monitor.  Sodium has improved to 133   Grade I obesity. Body mass index is 33.81 kg/m.  Would benefit from ongoing weight loss as outpatient.        Consultants: Palliative care Procedures performed:  Disposition: Home Diet recommendation:  Discharge Diet Orders (From admission, onward)     Start     Ordered   12/30/22 0000  Diet - low sodium heart healthy        12/30/22 1620           Regular diet DISCHARGE MEDICATION: Allergies as of 12/30/2022       Reactions   Morphine And Codeine Itching   Nickel Itching   Nortriptyline Other (See Comments)   Significant weight gain   Topamax [topiramate] Diarrhea, Nausea Only   Xanax [alprazolam] Other (See Comments)   "Can't wake up"   Actifed Cold-allergy [chlorpheniramine-phenyleph Er] Rash, Other (See Comments)   Red dye only   Amoxicillin Rash   Codeine Hives   Erythromycin Rash   Penicillins Rash      Red Dye Rash   Sudafed [pseudoephedrine Hcl] Rash, Other (  See Comments)   Red dye only        Medication List     STOP taking these medications    Lactulose 20 GM/30ML Soln   LORazepam 0.5 MG tablet Commonly known as: ATIVAN   oxycodone 30 MG immediate release tablet Commonly known as: ROXICODONE   oxyCODONE 60 MG 12 hr tablet Commonly known as: OXYCONTIN   promethazine 25 MG tablet Commonly known as: PHENERGAN       TAKE these medications    albuterol 108 (90 Base) MCG/ACT inhaler Commonly known as: VENTOLIN HFA Inhale 2 puffs into the lungs every 6 (six) hours  as needed for wheezing or shortness of breath.   B-12 3000 MCG Caps Take 3,000 mcg by mouth daily.   diazepam 5 MG tablet Commonly known as: VALIUM Take 1 tablet (5 mg total) by mouth at bedtime. What changed:  when to take this reasons to take this   docusate sodium 100 MG capsule Commonly known as: COLACE Take 200 mg by mouth in the morning and at bedtime.   furosemide 20 MG tablet Commonly known as: LASIX Take 1 tablet (20 mg total) by mouth daily as needed. What changed: when to take this   gabapentin 100 MG capsule Commonly known as: NEURONTIN Take 3 capsules (300 mg total) by mouth daily. TAKE THREE (3) CAPSULES BY MOUTH AT BEDTIME What changed:  when to take this additional instructions   HYDROmorphone 8 MG tablet Commonly known as: DILAUDID Take 1 tablet (8 mg total) by mouth every 4 (four) hours as needed (Breakthrough Pain).   magnesium oxide 400 (240 Mg) MG tablet Commonly known as: MAG-OX Take 1 tablet (400 mg total) by mouth 2 (two) times daily.   naloxegol oxalate 25 MG Tabs tablet Commonly known as: Movantik Take 1 tablet (25 mg total) by mouth daily.   omeprazole 40 MG capsule Commonly known as: PRILOSEC TAKE ONE CAPSULE BY MOUTH TWICE DAILY BEFORE MEALS What changed: when to take this   ondansetron 4 MG tablet Commonly known as: ZOFRAN Take 1 tablet (4 mg total) by mouth every 8 (eight) hours as needed for nausea or vomiting.   oxyCODONE ER 36 MG C12a Take 1 capsule (36 mg total) by mouth 4 (four) times daily.   Preparation H Rapid Relief 5-0.25-14.4-15 % Crea Apply 1 application  topically every 6 (six) hours as needed (for hemorrhoids).   prochlorperazine 10 MG tablet Commonly known as: COMPAZINE Take 10 mg by mouth 2 (two) times daily as needed for nausea or vomiting.   rosuvastatin 5 MG tablet Commonly known as: CRESTOR Take 5 mg by mouth at bedtime.   venlafaxine XR 150 MG 24 hr capsule Commonly known as: EFFEXOR-XR Take 150 mg by  mouth at bedtime.   Vitamin C Chew Chew 1 tablet by mouth daily.   Vitamin D3 50 MCG (2000 UT) Tabs Take 2,000 Units by mouth daily.        Follow-up Information     Vyas, Dhruv B, MD Follow up in 1 week(s).   Specialty: Internal Medicine Contact information: [address] [place] [address] [phone]         Doreatha Massed, MD Follow up in 1 week(s).   Specialty: Hematology Contact information: [address] [place] [address] [phone]                Discharge Exam: Ceasar Mons Weights   12/24/22 1353  Weight: 89.4 kg   General-appears in no acute  distress Heart-S1-S2, regular, no murmur auscultated Lungs-clear to auscultation bilaterally, no wheezing or crackles auscultated Abdomen-soft, nontender, no organomegaly Extremities-no edema in the lower extremities Neuro-alert, oriented x3, no focal deficit noted  Condition at discharge: good  The results of significant diagnostics from this hospitalization (including imaging, microbiology, ancillary and laboratory) are listed below for reference.   Imaging Studies: US Paracentesis Result Date: 12/25/2022 INDICATION: Patient with a history of ovarian cancer with recurrent ascites. Interventional radiology asked to perform a therapeutic paracentesis. EXAM: ULTRASOUND GUIDED PARACENTESIS MEDICATIONS: 1% lidocaine 10 mL COMPLICATIONS: None immediate. PROCEDURE: Informed written consent was obtained from the patient after a discussion of the risks, benefits and alternatives to treatment. A timeout was performed prior to the initiation of the procedure. Initial ultrasound scanning demonstrates a large amount of ascites within the right lower abdominal quadrant. The right lower abdomen was prepped and draped in the usual sterile fashion. 1% lidocaine was used for local anesthesia. Following this, a 19 gauge, 7-cm, Yueh catheter was introduced. An ultrasound image was saved for documentation purposes. The  paracentesis was performed. The catheter was removed and a dressing was applied. The patient tolerated the procedure well without immediate post procedural complication. FINDINGS: A total of approximately 2 L of clear yellow fluid was removed. IMPRESSION: Successful ultrasound-guided paracentesis yielding 2 liters of peritoneal fluid. Procedure performed by Alwyn Ren NP Electronically Signed   By: Gilmer Mor D.O.   On: 12/25/2022 12:20   CT ABDOMEN PELVIS W CONTRAST Result Date: 12/24/2022 CLINICAL DATA:  Abdominal pain for 2 weeks, stage IV ovarian cancer EXAM: CT ABDOMEN AND PELVIS WITH CONTRAST TECHNIQUE: Multidetector CT imaging of the abdomen and pelvis was performed using the standard protocol following bolus administration of intravenous contrast. RADIATION DOSE REDUCTION: This exam was performed according to the departmental dose-optimization program which includes automated exposure control, adjustment of the mA and/or kV according to patient size and/or use of iterative reconstruction technique. CONTRAST:  OMNIPAQUE IOHEXOL 300 MG/ML  SOLN COMPARISON:  12/15/2022 FINDINGS: Lower chest: No acute pleural or parenchymal lung disease. Hepatobiliary: No focal liver abnormality is seen. Status post cholecystectomy. No biliary dilatation. Pancreas: Unremarkable. No pancreatic ductal dilatation or surrounding inflammatory changes. Spleen: Stable complex and partially calcified splenic cyst. Spleen is stable. Adrenals/Urinary Tract: Adrenal glands are unremarkable. Kidneys are normal, without renal calculi, focal lesion, or hydronephrosis. Bladder is decompressed. Stomach/Bowel: No bowel obstruction or ileus.  Stable hiatal hernia. Vascular/Lymphatic: Stable aortic atherosclerosis. No other significant vascular finding. No discrete adenopathy within the abdomen or pelvis. Reproductive: Prior hysterectomy.  No adnexal mass. Other: Marked progression of the peritoneal carcinomatosis seen  previously, with diffuse mesenteric nodules and increasing complex ascites throughout the abdomen and pelvis. No free intraperitoneal gas. No abdominal wall hernia. Musculoskeletal: Left hip arthroplasty. No acute or destructive bony abnormalities. Reconstructed images demonstrate no additional findings. IMPRESSION: 1. Worsening diffuse peritoneal carcinomatosis, with large volume complex ascites throughout the abdomen and pelvis increased since prior study. Findings are consistent with progressive metastatic ovarian cancer. 2. No bowel obstruction or ileus.  Stable hiatal hernia. 3.  Aortic Atherosclerosis (ICD10-I70.0). Electronically Signed   By: Sharlet Salina M.D.   On: 12/24/2022 19:14   CT Angio Chest/Abd/Pel for Dissection W and/or Wo Contrast Result Date: 12/15/2022 CLINICAL DATA:  Upper abdominal pain for 1 week. History of ovarian cancer. EXAM: CT ANGIOGRAPHY CHEST, ABDOMEN AND PELVIS TECHNIQUE: Non-contrast CT of the chest was initially obtained. Multidetector CT imaging through the chest, abdomen and pelvis was performed  using the standard protocol during bolus administration of intravenous contrast. Multiplanar reconstructed images and MIPs were obtained and reviewed to evaluate the vascular anatomy. RADIATION DOSE REDUCTION: This exam was performed according to the departmental dose-optimization program which includes automated exposure control, adjustment of the mA and/or kV according to patient size and/or use of iterative reconstruction technique. CONTRAST:  OMNIPAQUE IOHEXOL 350 MG/ML SOLN COMPARISON:  November 22, 2022. FINDINGS: CTA CHEST FINDINGS Cardiovascular: Preferential opacification of the thoracic aorta. No evidence of thoracic aortic aneurysm or dissection. Normal heart size. No pericardial effusion. Mediastinum/Nodes: Small hiatal hernia. No adenopathy. Thyroid gland is unremarkable. Lungs/Pleura: No pneumothorax or pleural effusion is noted. Minimal subsegmental atelectasis is  noted posteriorly in right upper lobe. Left lung is clear. Musculoskeletal: No chest wall abnormality. No acute or significant osseous findings. Review of the MIP images confirms the above findings. CTA ABDOMEN AND PELVIS FINDINGS VASCULAR Aorta: Normal caliber aorta without aneurysm, dissection, vasculitis or significant stenosis. Celiac: Patent without evidence of aneurysm, dissection, vasculitis or significant stenosis. SMA: Patent without evidence of aneurysm, dissection, vasculitis or significant stenosis. Renals: Both renal arteries are patent without evidence of aneurysm, dissection, vasculitis, fibromuscular dysplasia or significant stenosis. IMA: Patent without evidence of aneurysm, dissection, vasculitis or significant stenosis. Inflow: Patent without evidence of aneurysm, dissection, vasculitis or significant stenosis. Veins: No obvious venous abnormality within the limitations of this arterial phase study. Review of the MIP images confirms the above findings. NON-VASCULAR Hepatobiliary: No focal liver abnormality is seen. Status post cholecystectomy. No biliary dilatation. Pancreas: Unremarkable. No pancreatic ductal dilatation or surrounding inflammatory changes. Spleen: Stable appearance of cystic exophytic splenic lesion with internal calcifications. Adrenals/Urinary Tract: Adrenal glands are unremarkable. Kidneys are normal, without renal calculi, focal lesion, or hydronephrosis. Bladder is unremarkable. Stomach/Bowel: Stomach is unremarkable. There is no evidence of bowel obstruction or inflammation. Lymphatic: 11 mm left common iliac lymph node is noted concerning for metastatic disease. Reproductive: Status post hysterectomy. Other: Mild ascites is noted which is increased compared to prior exam. Increased peritoneal implants are noted compared to prior exam consistent with peritoneal carcinomatosis. Musculoskeletal: Status post left total hip arthroplasty. No acute osseous abnormality is noted.  Review of the MIP images confirms the above findings. IMPRESSION: No evidence of thoracic or abdominal aortic dissection or aneurysm. Small hiatal hernia. Stable appearance of cystic exophytic splenic lesion with internal calcifications of unknown etiology. 11 mm left common iliac lymph node is noted concerning for metastatic disease. Mild ascites is noted which is increased compared to prior exam, with increased peritoneal implants noted diffusely consistent with peritoneal carcinomatosis. Electronically Signed   By: Lupita Raider M.D.   On: 12/15/2022 17:33    Microbiology: Results for orders placed or performed in visit on 08/02/21  Microscopic Examination     Status: Abnormal   Collection Time: 08/02/21 10:36 AM   Urine  Result Value Ref Range Status   WBC, UA 0-5 0 - 5 /hpf Final   RBC, Urine 3-10 (A) 0 - 2 /hpf Final   Epithelial Cells (non renal) 0-10 0 - 10 /hpf Final   Renal Epithel, UA None seen None seen /hpf Final   Mucus, UA Present Not Estab. Final   Bacteria, UA Few None seen/Few Final   *Note: Due to a large number of results and/or encounters for the requested time period, some results have not been displayed. A complete set of results can be found in Results Review.    Labs: CBC: Recent Labs  Lab  12/24/22 1431 12/25/22 0500 12/26/22 1011 12/30/22 0557  WBC 11.2* 9.9 9.2 9.7  NEUTROABS 9.0*  --   --   --   HGB 12.5 11.2* 10.2* 11.5*  HCT 36.4 33.4* 31.0* 34.8*  MCV 109.3* 111.7* 112.3* 109.4*  PLT 494* 476* 482* 669*   Basic Metabolic Panel: Recent Labs  Lab 12/24/22 1431 12/25/22 0500 12/26/22 1011 12/27/22 0611 12/30/22 0557  NA 129* 126* 130* 129* 133*  K 4.3 4.0 4.3 4.1 4.6  CL 88* 87* 91* 92* 96*  CO2 27 28 28 26 27   GLUCOSE 136* 102* 105* 105* 100*  BUN 22* 25* 30* 30* 32*  CREATININE 0.93 1.21* 1.06* 0.85 1.09*  CALCIUM 8.8* 8.5* 8.0* 8.1* 8.8*  MG  --   --  2.8*  --   --    Liver Function Tests: Recent Labs  Lab 12/24/22 1431  AST 43*   ALT 40  ALKPHOS 356*  BILITOT 0.4  PROT 7.5  ALBUMIN 2.7*   CBG: No results for input(s): "GLUCAP" in the last 168 hours.  Discharge time spent: greater than 30 minutes.  Signed: Meredeth Ide, MD [facility] 12/30/2022

## 2022-12-30 NOTE — Plan of Care (Signed)
  Problem: Education: Goal: Knowledge of General Education information will improve Description: Including pain rating scale, medication(s)/side effects and non-pharmacologic comfort measures Outcome: Adequate for Discharge   Problem: Health Behavior/Discharge Planning: Goal: Ability to manage health-related needs will improve Outcome: Adequate for Discharge   Problem: Clinical Measurements: Goal: Ability to maintain clinical measurements within normal limits will improve Outcome: Adequate for Discharge Goal: Diagnostic test results will improve Outcome: Adequate for Discharge   Problem: Activity: Goal: Risk for activity intolerance will decrease Outcome: Adequate for Discharge   Problem: Nutrition: Goal: Adequate nutrition will be maintained Outcome: Adequate for Discharge   Problem: Coping: Goal: Level of anxiety will decrease Outcome: Adequate for Discharge   Problem: Pain Management: Goal: General experience of comfort will improve Outcome: Adequate for Discharge   Problem: Safety: Goal: Ability to remain free from injury will improve Outcome: Adequate for Discharge

## 2022-12-31 ENCOUNTER — Encounter: Payer: Self-pay | Admitting: Hematology

## 2023-01-01 ENCOUNTER — Inpatient Hospital Stay: Payer: 59 | Admitting: Hematology

## 2023-01-01 ENCOUNTER — Other Ambulatory Visit (HOSPITAL_COMMUNITY): Payer: Self-pay

## 2023-01-01 ENCOUNTER — Encounter (HOSPITAL_COMMUNITY): Payer: Self-pay | Admitting: Hematology

## 2023-01-01 ENCOUNTER — Ambulatory Visit: Payer: 59

## 2023-01-01 ENCOUNTER — Encounter: Payer: Self-pay | Admitting: Hematology

## 2023-01-01 ENCOUNTER — Inpatient Hospital Stay: Payer: 59

## 2023-01-01 ENCOUNTER — Other Ambulatory Visit: Payer: Self-pay | Admitting: *Deleted

## 2023-01-01 MED ORDER — ONDANSETRON 4 MG PO TBDP: 4.0000 mg | ORAL_TABLET | Freq: Three times a day (TID) | ORAL | 2 refills | Status: DC | PRN

## 2023-01-02 ENCOUNTER — Other Ambulatory Visit: Payer: Self-pay

## 2023-01-07 ENCOUNTER — Inpatient Hospital Stay: Payer: 59 | Attending: Hematology | Admitting: Hematology

## 2023-01-07 ENCOUNTER — Inpatient Hospital Stay: Payer: 59

## 2023-01-07 VITALS — BP 103/60 | HR 91 | Temp 98.1°F | Resp 20 | Wt 201.4 lb

## 2023-01-07 DIAGNOSIS — C786 Secondary malignant neoplasm of retroperitoneum and peritoneum: Secondary | ICD-10-CM | POA: Insufficient documentation

## 2023-01-07 DIAGNOSIS — C563 Malignant neoplasm of bilateral ovaries: Secondary | ICD-10-CM

## 2023-01-07 DIAGNOSIS — Z5111 Encounter for antineoplastic chemotherapy: Secondary | ICD-10-CM | POA: Insufficient documentation

## 2023-01-07 DIAGNOSIS — Z96642 Presence of left artificial hip joint: Secondary | ICD-10-CM | POA: Insufficient documentation

## 2023-01-07 DIAGNOSIS — K219 Gastro-esophageal reflux disease without esophagitis: Secondary | ICD-10-CM | POA: Insufficient documentation

## 2023-01-07 DIAGNOSIS — Z95828 Presence of other vascular implants and grafts: Secondary | ICD-10-CM

## 2023-01-07 DIAGNOSIS — F17210 Nicotine dependence, cigarettes, uncomplicated: Secondary | ICD-10-CM | POA: Insufficient documentation

## 2023-01-07 DIAGNOSIS — R1012 Left upper quadrant pain: Secondary | ICD-10-CM | POA: Insufficient documentation

## 2023-01-07 DIAGNOSIS — R180 Malignant ascites: Secondary | ICD-10-CM | POA: Insufficient documentation

## 2023-01-07 DIAGNOSIS — Z90710 Acquired absence of both cervix and uterus: Secondary | ICD-10-CM | POA: Insufficient documentation

## 2023-01-07 DIAGNOSIS — Z5112 Encounter for antineoplastic immunotherapy: Secondary | ICD-10-CM | POA: Insufficient documentation

## 2023-01-07 DIAGNOSIS — Z79899 Other long term (current) drug therapy: Secondary | ICD-10-CM | POA: Insufficient documentation

## 2023-01-07 LAB — CBC WITH DIFFERENTIAL/PLATELET
Abs Immature Granulocytes: 0.15 10*3/uL — ABNORMAL HIGH (ref 0.00–0.07)
Basophils Absolute: 0 10*3/uL (ref 0.0–0.1)
Basophils Relative: 0 %
Eosinophils Absolute: 0 10*3/uL (ref 0.0–0.5)
Eosinophils Relative: 0 %
HCT: 36.5 % (ref 36.0–46.0)
Hemoglobin: 12.1 g/dL (ref 12.0–15.0)
Immature Granulocytes: 1 %
Lymphocytes Relative: 5 %
Lymphs Abs: 0.8 10*3/uL (ref 0.7–4.0)
MCH: 34.8 pg — ABNORMAL HIGH (ref 26.0–34.0)
MCHC: 33.2 g/dL (ref 30.0–36.0)
MCV: 104.9 fL — ABNORMAL HIGH (ref 80.0–100.0)
Monocytes Absolute: 1 10*3/uL (ref 0.1–1.0)
Monocytes Relative: 6 %
Neutro Abs: 14.7 10*3/uL — ABNORMAL HIGH (ref 1.7–7.7)
Neutrophils Relative %: 88 %
Platelets: 532 10*3/uL — ABNORMAL HIGH (ref 150–400)
RBC: 3.48 MIL/uL — ABNORMAL LOW (ref 3.87–5.11)
RDW: 16.2 % — ABNORMAL HIGH (ref 11.5–15.5)
WBC: 16.7 10*3/uL — ABNORMAL HIGH (ref 4.0–10.5)
nRBC: 0 % (ref 0.0–0.2)

## 2023-01-07 LAB — MAGNESIUM: Magnesium: 2 mg/dL (ref 1.7–2.4)

## 2023-01-07 LAB — COMPREHENSIVE METABOLIC PANEL
ALT: 37 U/L (ref 0–44)
AST: 41 U/L (ref 15–41)
Albumin: 2 g/dL — ABNORMAL LOW (ref 3.5–5.0)
Alkaline Phosphatase: 468 U/L — ABNORMAL HIGH (ref 38–126)
Anion gap: 13 (ref 5–15)
BUN: 34 mg/dL — ABNORMAL HIGH (ref 6–20)
CO2: 25 mmol/L (ref 22–32)
Calcium: 8.6 mg/dL — ABNORMAL LOW (ref 8.9–10.3)
Chloride: 89 mmol/L — ABNORMAL LOW (ref 98–111)
Creatinine, Ser: 0.91 mg/dL (ref 0.44–1.00)
GFR, Estimated: >60 mL/min (ref >60)
Glucose, Bld: 128 mg/dL — ABNORMAL HIGH (ref 70–99)
Potassium: 4.7 mmol/L (ref 3.5–5.1)
Sodium: 127 mmol/L — ABNORMAL LOW (ref 135–145)
Total Bilirubin: 0.4 mg/dL (ref <1.2)
Total Protein: 6.7 g/dL (ref 6.5–8.1)

## 2023-01-07 LAB — TOTAL PROTEIN, URINE DIPSTICK: Protein, ur: 65 mg/dL — AB

## 2023-01-07 MED ORDER — SODIUM CHLORIDE 0.9% FLUSH
10.0000 mL | Freq: Once | INTRAVENOUS | Status: AC
Start: 2023-01-07 — End: 2023-01-07
  Administered 2023-01-07: 10 mL via INTRAVENOUS

## 2023-01-07 MED ORDER — SODIUM CHLORIDE 0.9% FLUSH
10.0000 mL | INTRAVENOUS | Status: DC | PRN
  Administered 2023-01-07: 10 mL

## 2023-01-07 MED ORDER — ALTEPLASE 2 MG IJ SOLR
2.0000 mg | Freq: Once | INTRAMUSCULAR | Status: AC
  Administered 2023-01-07: 2 mg
  Filled 2023-01-07: qty 2

## 2023-01-07 MED ORDER — SODIUM CHLORIDE 0.9 % IV SOLN: 10.0000 mg | Freq: Once | INTRAVENOUS | Status: DC

## 2023-01-07 MED ORDER — SODIUM CHLORIDE 0.9 % IV SOLN
150.0000 mg | Freq: Once | INTRAVENOUS | Status: AC
  Administered 2023-01-07: 150 mg via INTRAVENOUS
  Filled 2023-01-07: qty 5

## 2023-01-07 MED ORDER — SODIUM CHLORIDE 0.9 % IV SOLN
15.0000 mg/kg | Freq: Once | INTRAVENOUS | Status: AC
  Administered 2023-01-07: 1200 mg via INTRAVENOUS
  Filled 2023-01-07: qty 48

## 2023-01-07 MED ORDER — CARBOPLATIN CHEMO INJECTION 600 MG/60ML
468.4000 mg | Freq: Once | INTRAVENOUS | Status: AC
  Administered 2023-01-07: 470 mg via INTRAVENOUS
  Filled 2023-01-07: qty 47

## 2023-01-07 MED ORDER — PROCHLORPERAZINE MALEATE 10 MG PO TABS
10.0000 mg | ORAL_TABLET | Freq: Once | ORAL | Status: AC
Start: 2023-01-07 — End: 2023-01-07
  Administered 2023-01-07: 10 mg via ORAL
  Filled 2023-01-07: qty 1

## 2023-01-07 MED ORDER — CETIRIZINE HCL 10 MG/ML IV SOLN
10.0000 mg | Freq: Once | INTRAVENOUS | Status: AC
  Administered 2023-01-07: 10 mg via INTRAVENOUS
  Filled 2023-01-07: qty 1

## 2023-01-07 MED ORDER — DICYCLOMINE HCL 20 MG PO TABS: 20.0000 mg | ORAL_TABLET | Freq: Four times a day (QID) | ORAL | 1 refills | Status: DC | PRN

## 2023-01-07 MED ORDER — PALONOSETRON HCL INJECTION 0.25 MG/5ML
0.2500 mg | Freq: Once | INTRAVENOUS | Status: AC
  Administered 2023-01-07: 0.25 mg via INTRAVENOUS
  Filled 2023-01-07: qty 5

## 2023-01-07 MED ORDER — SODIUM CHLORIDE 0.9 % IV SOLN
INTRAVENOUS | Status: DC
Start: 2023-01-07 — End: 2023-01-07

## 2023-01-07 MED ORDER — SODIUM CHLORIDE 0.9 % IV SOLN
600.0000 mg/m2 | Freq: Once | INTRAVENOUS | Status: AC
  Administered 2023-01-07: 1140 mg via INTRAVENOUS
  Filled 2023-01-07: qty 29.98

## 2023-01-07 MED ORDER — DEXAMETHASONE SODIUM PHOSPHATE 10 MG/ML IJ SOLN
10.0000 mg | Freq: Once | INTRAMUSCULAR | Status: AC
Start: 2023-01-07 — End: 2023-01-07
  Administered 2023-01-07: 10 mg via INTRAVENOUS
  Filled 2023-01-07: qty 1

## 2023-01-07 MED ORDER — FAMOTIDINE IN NACL 20-0.9 MG/50ML-% IV SOLN
20.0000 mg | Freq: Once | INTRAVENOUS | Status: AC
  Administered 2023-01-07: 20 mg via INTRAVENOUS
  Filled 2023-01-07: qty 50

## 2023-01-07 MED ORDER — SODIUM CHLORIDE 0.9 % IV SOLN: INTRAVENOUS | Status: DC

## 2023-01-07 MED ORDER — ONDANSETRON 4 MG PO TBDP: 4.0000 mg | ORAL_TABLET | Freq: Once | ORAL | Status: DC

## 2023-01-07 MED ORDER — HEPARIN SOD (PORK) LOCK FLUSH 100 UNIT/ML IV SOLN
500.0000 [IU] | Freq: Once | INTRAVENOUS | Status: AC | PRN
  Administered 2023-01-07: 500 [IU]

## 2023-01-07 NOTE — Progress Notes (Signed)
Patient tolerated chemotherapy with no complaints voiced. Side effects with management reviewed understanding verbalized. Port site clean and dry with no bruising or swelling noted at site. Good blood return noted before and after administration of chemotherapy. Band aid applied. Patient left in satisfactory condition with VSS and no s/s of distress noted. 

## 2023-01-07 NOTE — Progress Notes (Signed)
Chemotherapy consent signed with education packet given.  All questions asked and answered.

## 2023-01-07 NOTE — Progress Notes (Signed)
Patient has been examined by Dr. Ellin Saba. Vital signs and labs have been reviewed by MD - ANC, Creatinine, LFTs, hemoglobin, and platelets are within treatment parameters per M.D. - pt may proceed with treatment.  Gemcitabine at 600 mg/m2 today. Primary RN and pharmacy notified.

## 2023-01-07 NOTE — Patient Instructions (Signed)
[facility] Chemotherapy Teaching   You will see the doctor regularly throughout treatment.  We will obtain blood work from you prior to every treatment and monitor your results to make sure it is safe to give your treatment. The doctor monitors your response to treatment by the way you are feeling, your blood work, and by obtaining scans periodically.  There will be wait times while you are here for treatment.  It will take about 30 minutes to 1 hour for your lab work to result.  Then there will be wait times while pharmacy mixes your medications.     Bevacizumab (Avastin)  About This Drug Bevacizumab is used to treat cancer. It is given in the vein (IV).  Possible Side Effects  Teary eyes   Runny/stuffy nose   Nosebleed   Changes in the way food and drinks taste   Headache   Back pain   Protein in your urine   Bleeding in your rectum   Dry skin   A red skin rash which can be peeling or scaling   High blood pressure  Note: Each of the side effects above was reported in 10% or greater of patients treated with bevacizumab-xxxx. Not all possible side effects are included above.  Warnings and Precautions   Perforation or fistula- an abnormal hole in your stomach, intestine, esophagus, or other organ, which can be life-threatening  Slow wound healing, which can be life-threatening  Abnormal bleeding which can be life-threatening - symptoms may be coughing up blood, throwing up blood (may look like coffee grounds), red or black tarry bowel movements, abnormally heavy menstrual flow, nosebleeds or any other unusual bleeding.  Blood clots and events such as stroke and heart attack. A blood clot in your leg may cause your leg to swell, appear red and warm, and/or cause pain. A blood clot in your lungs may cause trouble breathing, pain when breathing, and/or chest pain.  Severe high blood pressure  Changes in your central nervous system can happen. The central  nervous system is made up of your brain and spinal cord. You could feel extreme tiredness, agitation, confusion, hallucinations   This drug may interact with other medicines. Tell your doctor and pharmacist about all the medicines and dietary supplements (vitamins, minerals, herbs and others) that you are taking at this time. Also, check with your doctor or pharmacist before starting any new prescription or over-the-counter medicines, or dietary supplements to make sure that there are no interactions.  When to Call the Doctor Call your doctor or nurse if you have any of these symptoms and/or any new or unusual symptoms:   Fever of 100.4 F (38 C) or higher   Chills   Confusion and/or agitation   Hallucinations   Trouble understanding or speaking   Headache that does not go away   Nose bleed that doesn't stop bleeding after 10 -15 minutes   Feeling dizzy or lightheaded   Blurry vision or changes in your eyesight   Difficulty swallowing   Easy bleeding or bruising   Blood in your urine, vomit (bright red or coffee-ground) and/or stools ( bright red, or black/tarry)   Coughing up blood   Wheezing and/or trouble breathing   Chest pain or symptoms of a heart attack. Most heart attacks involve pain in the center of the chest that lasts more than a few minutes. The pain may go away and come back. It can feel like pressure, squeezing, fullness, or pain. Sometimes pain  is felt in one or both arms, the back, neck, jaw, or stomach. If any of these symptoms last 2 minutes, call 911.   Symptoms of a stroke such as sudden numbness or weakness of your face, arm, or leg, mostly on one side of your body; sudden confusion, trouble speaking or understanding; sudden trouble seeing in one or both eyes; sudden trouble walking, feeling dizzy, loss of balance or coordination; or sudden, bad headache with no known cause. If you have any of these symptoms for 2 minutes, call 911.   Numbness or lack of  strength to your arms, legs, face, or body   Nausea that stops you from eating or drinking and/or relieved by prescribed medicine   Throwing up   Pain in your abdomen that does not go away   Foamy or bubbly-looking urine   Signs of infusion reaction: fever or shaking chills, flushing, facial swelling, feeling dizzy, headache, trouble breathing, rash, itching, chest tightness, or chest pain.   Pain that does not go away or is not relieved by prescribed medicine   Your leg or arm is swollen, red, warm and/or painful   Swelling of arms, hands, legs and/or feet   Weight gain of 5 pounds in one week (fluid retention)   If you think you may be pregnant  Reproduction Warnings  Pregnancy warning: This drug can have harmful effects on the unborn baby. Women of child bearing potential should use effective methods of birth control during your cancer treatment and for 6 months after treatment. In women, changes in your ovaries may happen that may cause menstrual bleeding to become irregular or stop, do not assume you cannot get pregnant. Let your doctor know right away if you think you may be pregnant   Breastfeeding warning: Women should not breastfeed during treatment and for 6 months after treatment because this drug could enter the breast milk and cause harm to a breastfeeding baby.    Fertility warning: In women, this drug may affect your ability to have children in the future. Talk with your doctor or nurse if you plan to have children. Ask for information on egg banking.     SELF CARE ACTIVITIES WHILE RECEIVING CHEMOTHERAPY:  Hydration Increase your fluid intake 48 hours prior to treatment and drink at least 8 to 12 cups (64 ounces) of water/decaffeinated beverages per day after treatment. You can still have your cup of coffee or soda but these beverages do not count as part of your 8 to 12 cups that you need to drink daily. No alcohol intake.  Medications Continue taking your normal  prescription medication as prescribed.  If you start any new herbal or new supplements please let us know first to make sure it is safe.  Mouth Care Have teeth cleaned professionally before starting treatment. Keep dentures and partial plates clean. Use soft toothbrush and do not use mouthwashes that contain alcohol. Biotene is a good mouthwash that is available at most pharmacies or may be ordered by calling [phone]. Use warm salt water gargles (1 teaspoon salt per 1 quart warm water) before and after meals and at bedtime. If you need dental work, please let the doctor know before you go for your appointment so that we can coordinate the best possible time for you in regards to your chemo regimen. You need to also let your dentist know that you are actively taking chemo. We may need to do labs prior to your dental appointment.  Skin Care Always  use sunscreen that has not expired and with SPF (Sun Protection Factor) of 50 or higher. Wear hats to protect your head from the sun. Remember to use sunscreen on your hands, ears, face, & feet.  Use good moisturizing lotions such as udder cream, eucerin, or even Vaseline. Some chemotherapies can cause dry skin, color changes in your skin and nails.    Avoid long, hot showers or baths. Use gentle, fragrance-free soaps and laundry detergent. Use moisturizers, preferably creams or ointments rather than lotions because the thicker consistency is better at preventing skin dehydration. Apply the cream or ointment within 15 minutes of showering. Reapply moisturizer at night, and moisturize your hands every time after you wash them.  Hair Loss (if your doctor says your hair will fall out)  If your doctor says that your hair is likely to fall out, decide before you begin chemo whether you want to wear a wig. You may want to shop before treatment to match your hair color. Hats, turbans, and scarves can also camouflage hair loss, although some people prefer to  leave their heads uncovered. If you go bare-headed outdoors, be sure to use sunscreen on your scalp. Cut your hair short. It eases the inconvenience of shedding lots of hair, but it also can reduce the emotional impact of watching your hair fall out. Don't perm or color your hair during chemotherapy. Those chemical treatments are already damaging to hair and can enhance hair loss. Once your chemo treatments are done and your hair has grown back, it's OK to resume dyeing or perming hair.  With chemotherapy, hair loss is almost always temporary. But when it grows back, it may be a different color or texture. In older adults who still had hair color before chemotherapy, the new growth may be completely gray.  Often, new hair is very fine and soft.  Infection Prevention Please wash your hands for at least 30 seconds using warm soapy water. Handwashing is the #1 way to prevent the spread of germs. Stay away from sick people or people who are getting over a cold. If you develop respiratory systems such as green/yellow mucus production or productive cough or persistent cough let us know and we will see if you need an antibiotic. It is a good idea to keep a pair of gloves on when going into grocery stores/[employer] to decrease your risk of coming into contact with germs on the carts, etc. Carry alcohol hand gel with you at all times and use it frequently if out in public. If your temperature reaches 100.4 or higher please call the clinic and let us know.  If it is after hours or on the weekend please go to the ER if your temperature is over 100.5.  Please have your own personal thermometer at home to use.    Sex and bodily fluids If you are going to have sex, a condom must be used to protect the person that isn't taking chemotherapy. Chemo can decrease your libido (sex drive). For a few days after chemotherapy, chemotherapy can be excreted through your bodily fluids.  When using the toilet please close the lid and  flush the toilet twice.  Do this for a few day after you have had chemotherapy.   Effects of chemotherapy on your sex life Some changes are simple and won't last long. They won't affect your sex life permanently.  Sometimes you may feel: too tired not strong enough to be very active sick or sore  not in  the mood anxious or low Your anxiety might not seem related to sex. For example, you may be worried about the cancer and how your treatment is going. Or you may be worried about money, or about how you family are coping with your illness.  These things can cause stress, which can affect your interest in sex. It's important to talk to your partner about how you feel.  Remember - the changes to your sex life don't usually last long. There's usually no medical reason to stop having sex during chemo. The drugs won't have any long term physical effects on your performance or enjoyment of sex. Cancer can't be passed on to your partner during sex  Contraception It's important to use reliable contraception during treatment. Avoid getting pregnant while you or your partner are having chemotherapy. This is because the drugs may harm the baby. Sometimes chemotherapy drugs can leave a man or woman infertile.  This means you would not be able to have children in the future. You might want to talk to someone about permanent infertility. It can be very difficult to learn that you may no longer be able to have children. Some people find counselling helpful. There might be ways to preserve your fertility, although this is easier for men than for women. You may want to speak to a fertility expert. You can talk about sperm banking or harvesting your eggs. You can also ask about other fertility options, such as donor eggs. If you have or have had breast cancer, your doctor might advise you not to take the contraceptive pill. This is because the hormones in it might affect the cancer. It is not known for sure whether or not  chemotherapy drugs can be passed on through semen or secretions from the vagina. Because of this some doctors advise people to use a barrier method if you have sex during treatment. This applies to vaginal, anal or oral sex. Generally, doctors advise a barrier method only for the time you are actually having the treatment and for about a week after your treatment. Advice like this can be worrying, but this does not mean that you have to avoid being intimate with your partner. You can still have close contact with your partner and continue to enjoy sex.  Animals If you have cats or birds we just ask that you not change the litter or change the cage.  Please have someone else do this for you while you are on chemotherapy.   Food Safety During and After Cancer Treatment Food safety is important for people both during and after cancer treatment. Cancer and cancer treatments, such as chemotherapy, radiation therapy, and stem cell/bone marrow transplantation, often weaken the immune system. This makes it harder for your body to protect itself from foodborne illness, also called food poisoning. Foodborne illness is caused by eating food that contains harmful bacteria, parasites, or viruses.  Foods to avoid Some foods have a higher risk of becoming tainted with bacteria. These include: Unwashed fresh fruit and vegetables, especially leafy vegetables that can hide dirt and other contaminants Raw sprouts, such as alfalfa sprouts Raw or undercooked beef, especially ground beef, or other raw or undercooked meat and poultry Fatty, fried, or spicy foods immediately before or after treatment.  These can sit heavy on your stomach and make you feel nauseous. Raw or undercooked shellfish, such as oysters. Sushi and sashimi, which often contain raw fish.  Unpasteurized beverages, such as unpasteurized fruit juices, raw milk, raw yogurt,  or cider Undercooked eggs, such as soft boiled, over easy, and poached; raw,  unpasteurized eggs; or foods made with raw egg, such as homemade raw cookie dough and homemade mayonnaise  Simple steps for food safety  Shop smart. Do not buy food stored or displayed in an unclean area. Do not buy bruised or damaged fruits or vegetables. Do not buy cans that have cracks, dents, or bulges. Pick up foods that can spoil at the end of your shopping trip and store them in a cooler on the way home.  Prepare and clean up foods carefully. Rinse all fresh fruits and vegetables under running water, and dry them with a clean towel or paper towel. Clean the top of cans before opening them. After preparing food, wash your hands for 20 seconds with hot water and soap. Pay special attention to areas between fingers and under nails. Clean your utensils and dishes with hot water and soap. Disinfect your kitchen and cutting boards using 1 teaspoon of liquid, unscented bleach mixed into 1 quart of water.    Dispose of old food. Eat canned and packaged food before its expiration date (the "use by" or "best before" date). Consume refrigerated leftovers within 3 to 4 days. After that time, throw out the food. Even if the food does not smell or look spoiled, it still may be unsafe. Some bacteria, such as Listeria, can grow even on foods stored in the refrigerator if they are kept for too long.  Take precautions when eating out. At restaurants, avoid buffets and salad bars where food sits out for a long time and comes in contact with many people. Food can become contaminated when someone with a virus, often a norovirus, or another "bug" handles it. Put any leftover food in a "to-go" container yourself, rather than having the server do it. And, refrigerate leftovers as soon as you get home. Choose restaurants that are clean and that are willing to prepare your food as you order it cooked.   AT HOME MEDICATIONS:                                                                                                                                                                 Compazine/Prochlorperazine 10mg  tablet. Take 1 tablet every 6 hours as needed for nausea/vomiting. (This can make you sleepy)   EMLA cream. Apply a quarter size amount to port site 1 hour prior to chemo. Do not rub in. Cover with plastic wrap.    Diarrhea Sheet   If you are having loose stools/diarrhea, please purchase Imodium and begin taking as outlined:  At the first sign of poorly formed or loose stools you should begin taking Imodium (loperamide) 2 mg capsules.  Take two tablets (4mg ) followed by one tablet (  2mg ) every 2 hours - DO NOT EXCEED 8 tablets in 24 hours.  If it is bedtime and you are having loose stools, take 2 tablets at bedtime, then 2 tablets every 4 hours until morning.   Always call the Cancer Center if you are having loose stools/diarrhea that you can't get under control.  Loose stools/diarrhea leads to dehydration (loss of water) in your body.  We have other options of trying to get the loose stools/diarrhea to stop but you must let us know!   Constipation Sheet  Colace - 100 mg capsules - take 2 capsules daily.  If this doesn't help then you can increase to 2 capsules twice daily.  Please call if the above does not work for you. Do not go more than 2 days without a bowel movement.  It is very important that you do not become constipated.  It will make you feel sick to your stomach (nausea) and can cause abdominal pain and vomiting.  Nausea Sheet   Compazine/Prochlorperazine 10mg  tablet. Take 1 tablet every 6 hours as needed for nausea/vomiting (This can make you drowsy).  If you are having persistent nausea (nausea that does not stop) please call the Cancer Center and let us know the amount of nausea that you are experiencing.  If you begin to vomit, you need to call the Cancer Center and if it is the weekend and you have vomited more than one time and can't get it to stop-go to the Emergency  Room.  Persistent nausea/vomiting can lead to dehydration (loss of fluid in your body) and will make you feel very weak and unwell. Ice chips, sips of clear liquids, foods that are at room temperature, crackers, and toast tend to be better tolerated.   SYMPTOMS TO REPORT AS SOON AS POSSIBLE AFTER TREATMENT:  FEVER GREATER THAN 100.4 F  CHILLS WITH OR WITHOUT FEVER  NAUSEA AND VOMITING THAT IS NOT CONTROLLED WITH YOUR NAUSEA MEDICATION  UNUSUAL SHORTNESS OF BREATH  UNUSUAL BRUISING OR BLEEDING  TENDERNESS IN MOUTH AND THROAT WITH OR WITHOUT   PRESENCE OF ULCERS  URINARY PROBLEMS  BOWEL PROBLEMS  UNUSUAL RASH      Wear comfortable clothing and clothing appropriate for easy access to any Portacath or PICC line. Let us know if there is anything that we can do to make your therapy better!    What to do if you need assistance after hours or on the weekends: CALL [phone].  HOLD on the line, do not hang up.  You will hear multiple messages but at the end you will be connected with a nurse triage line.  They will contact the doctor if necessary.  Most of the time they will be able to assist you.  Do not call the hospital operator.      I have been informed and understand all of the instructions given to me and have received a copy. I have been instructed to call the clinic [phone] or my family physician as soon as possible for continued medical care, if indicated. I do not have any more questions at this time but understand that I may call the Cancer Center or the Patient Navigator at [phone] during office hours should I have questions or need assistance in obtaining follow-up care.        Carboplatin (Paraplatin, CBDCA)  About This Drug  Carboplatin is used to treat cancer. It is given in the vein (IV).  It will take 30 minutes to infuse.  Possible Side Effects   Bone marrow suppression. This is a decrease in the number of white blood cells, red blood  cells, and platelets. This may raise your risk of infection, make you tired and weak (fatigue), and raise your risk of bleeding.   Nausea and vomiting (throwing up)   Weakness   Changes in your liver function   Changes in your kidney function   Electrolyte changes   Pain  Note: Each of the side effects above was reported in 20% or greater of patients treated with carboplatin. Not all possible side effects are included above.   Warnings and Precautions   Severe bone marrow suppression   Allergic reactions, including anaphylaxis are rare but may happen in some patients. Signs of allergic reaction to this drug may be swelling of the face, feeling like your tongue or throat are swelling, trouble breathing, rash, itching, fever, chills, feeling dizzy, and/or feeling that your heart is beating in a fast or not normal way. If this happens, do not take another dose of this drug. You should get urgent medical treatment.   Severe nausea and vomiting   Effects on the nerves are called peripheral neuropathy. This risk is increased if you are over the age of 65 or if you have received other medicine with risk of peripheral neuropathy. You may feel numbness, tingling, or pain in your hands and feet. It may be hard for you to button your clothes, open jars, or walk as usual. The effect on the nerves may get worse with more doses of the drug. These effects get better in some people after the drug is stopped but it does not get better in all people.   Blurred vision, loss of vision or other changes in eyesight   Decreased hearing   - Skin and tissue irritation including redness, pain, warmth, or swelling at the IV site if the drug leaks out of the vein and into nearby tissue.   Severe changes in your kidney function, which can cause kidney failure   Severe changes in your liver function, which can cause liver failure  Note: Some of the side effects above are very rare. If you have concerns and/or  questions, please discuss them with your medical team.   Important Information   This drug may be present in the saliva, tears, sweat, urine, stool, vomit, semen, and vaginal secretions. Talk to your doctor and/or your nurse about the necessary precautions to take during this time.   Treating Side Effects   Manage tiredness by pacing your activities for the day.   Be sure to include periods of rest between energy-draining activities.   To decrease the risk of infection, wash your hands regularly.   Avoid close contact with people who have a cold, the flu, or other infections.   Take your temperature as your doctor or nurse tells you, and whenever you feel like you may have a fever.   To help decrease the risk of bleeding, use a soft toothbrush. Check with your nurse before using dental floss.   Be very careful when using knives or tools.   Use an electric shaver instead of a razor.   Drink plenty of fluids (a minimum of eight glasses per day is recommended).   If you throw up or have loose bowel movements, you should drink more fluids so that you do not become dehydrated (lack of water in the body from losing too much fluid).   To help with nausea and  vomiting, eat small, frequent meals instead of three large meals a day. Choose foods and drinks that are at room temperature. Ask your nurse or doctor about other helpful tips and medicine that is available to help stop or lessen these symptoms.   If you have numbness and tingling in your hands and feet, be careful when cooking, walking, and handling sharp objects and hot liquids.   Keeping your pain under control is important to your well-being. Please tell your doctor or nurse if you are experiencing pain.   Food and Drug Interactions   There are no known interactions of carboplatin with food.   This drug may interact with other medicines. Tell your doctor and pharmacist about all the prescription and over-the-counter  medicines and dietary supplements (vitamins, minerals, herbs and others) that you are taking at this time. Also, check with your doctor or pharmacist before starting any new prescription or over-the-counter medicines, or dietary supplements to make sure that there are no interactions.   When to Call the Doctor  Call your doctor or nurse if you have any of these symptoms and/or any new or unusual symptoms:   Fever of 100.4 F (38 C) or higher   Chills   Tiredness that interferes with your daily activities   Feeling dizzy or lightheaded   Easy bleeding or bruising   Nausea that stops you from eating or drinking and/or is not relieved by prescribed medicines   Throwing up   Blurred vision or other changes in eyesight   Decrease in hearing or ringing in the ear   Signs of allergic reaction: swelling of the face, feeling like your tongue or throat are swelling, trouble breathing, rash, itching, fever, chills, feeling dizzy, and/or feeling that your heart is beating in a fast or not normal way. If this happens, call 911 for emergency care.   Signs of possible liver problems: dark urine, pale bowel movements, bad stomach pain, feeling very tired and weak, unusual itching, or yellowing of the eyes or skin   Decreased urine, or very dark urine   Numbness, tingling, or pain in your hands and feet   Pain that does not go away or is not relieved by prescribed medicine   While you are getting this drug, please tell your nurse right away if you have any pain, redness, or swelling at the site of the IV infusion, or if you have any new onset of symptoms, or if you just feel "different" from before when the infusion was started.   Reproduction Warnings   Pregnancy warning: This drug may have harmful effects on the unborn baby. Women of child bearing potential should use effective methods of birth control during your cancer treatment. Let your doctor know right away if you think you may be  pregnant.   Breastfeeding warning: It is not known if this drug passes into breast milk. For this reason, women should not breastfeed during treatment because this drug could enter the breast milk and cause harm to a breastfeeding baby.   Fertility warning: Human fertility studies have not been done with this drug. Talk with your doctor or nurse if you plan to have children. Ask for information on sperm or egg banking.   SELF CARE ACTIVITIES WHILE RECEIVING CHEMOTHERAPY:  Hydration Increase your fluid intake 48 hours prior to treatment and drink at least 8 to 12 cups (64 ounces) of water/decaffeinated beverages per day after treatment. You can still have your cup of coffee or soda but  these beverages do not count as part of your 8 to 12 cups that you need to drink daily. No alcohol intake.  Medications Continue taking your normal prescription medication as prescribed.  If you start any new herbal or new supplements please let us know first to make sure it is safe.  Mouth Care Have teeth cleaned professionally before starting treatment. Keep dentures and partial plates clean. Use soft toothbrush and do not use mouthwashes that contain alcohol. Biotene is a good mouthwash that is available at most pharmacies or may be ordered by calling [phone]. Use warm salt water gargles (1 teaspoon salt per 1 quart warm water) before and after meals and at bedtime. If you need dental work, please let the doctor know before you go for your appointment so that we can coordinate the best possible time for you in regards to your chemo regimen. You need to also let your dentist know that you are actively taking chemo. We may need to do labs prior to your dental appointment.  Skin Care Always use sunscreen that has not expired and with SPF (Sun Protection Factor) of 50 or higher. Wear hats to protect your head from the sun. Remember to use sunscreen on your hands, ears, face, & feet.  Use good moisturizing  lotions such as udder cream, eucerin, or even Vaseline. Some chemotherapies can cause dry skin, color changes in your skin and nails.    Avoid long, hot showers or baths. Use gentle, fragrance-free soaps and laundry detergent. Use moisturizers, preferably creams or ointments rather than lotions because the thicker consistency is better at preventing skin dehydration. Apply the cream or ointment within 15 minutes of showering. Reapply moisturizer at night, and moisturize your hands every time after you wash them.  Hair Loss (if your doctor says your hair will fall out)  If your doctor says that your hair is likely to fall out, decide before you begin chemo whether you want to wear a wig. You may want to shop before treatment to match your hair color. Hats, turbans, and scarves can also camouflage hair loss, although some people prefer to leave their heads uncovered. If you go bare-headed outdoors, be sure to use sunscreen on your scalp. Cut your hair short. It eases the inconvenience of shedding lots of hair, but it also can reduce the emotional impact of watching your hair fall out. Don't perm or color your hair during chemotherapy. Those chemical treatments are already damaging to hair and can enhance hair loss. Once your chemo treatments are done and your hair has grown back, it's OK to resume dyeing or perming hair.  With chemotherapy, hair loss is almost always temporary. But when it grows back, it may be a different color or texture. In older adults who still had hair color before chemotherapy, the new growth may be completely gray.  Often, new hair is very fine and soft.  Infection Prevention Please wash your hands for at least 30 seconds using warm soapy water. Handwashing is the #1 way to prevent the spread of germs. Stay away from sick people or people who are getting over a cold. If you develop respiratory systems such as green/yellow mucus production or productive cough or persistent cough  let us know and we will see if you need an antibiotic. It is a good idea to keep a pair of gloves on when going into grocery stores/[employer] to decrease your risk of coming into contact with germs on the carts, etc. Carry  alcohol hand gel with you at all times and use it frequently if out in public. If your temperature reaches 100.4 or higher please call the clinic and let us know.  If it is after hours or on the weekend please go to the ER if your temperature is over 100.4.  Please have your own personal thermometer at home to use.    Sex and bodily fluids If you are going to have sex, a condom must be used to protect the person that isn't taking chemotherapy. Chemo can decrease your libido (sex drive). For a few days after chemotherapy, chemotherapy can be excreted through your bodily fluids.  When using the toilet please close the lid and flush the toilet twice.  Do this for a few day after you have had chemotherapy.   Effects of chemotherapy on your sex life Some changes are simple and won't last long. They won't affect your sex life permanently.  Sometimes you may feel: too tired not strong enough to be very active sick or sore  not in the mood anxious or low  Your anxiety might not seem related to sex. For example, you may be worried about the cancer and how your treatment is going. Or you may be worried about money, or about how you family are coping with your illness.  These things can cause stress, which can affect your interest in sex. It's important to talk to your partner about how you feel.  Remember - the changes to your sex life don't usually last long. There's usually no medical reason to stop having sex during chemo. The drugs won't have any long term physical effects on your performance or enjoyment of sex. Cancer can't be passed on to your partner during sex  Contraception It's important to use reliable contraception during treatment. Avoid getting pregnant while you or your  partner are having chemotherapy. This is because the drugs may harm the baby. Sometimes chemotherapy drugs can leave a man or woman infertile.  This means you would not be able to have children in the future. You might want to talk to someone about permanent infertility. It can be very difficult to learn that you may no longer be able to have children. Some people find counselling helpful. There might be ways to preserve your fertility, although this is easier for men than for women. You may want to speak to a fertility expert. You can talk about sperm banking or harvesting your eggs. You can also ask about other fertility options, such as donor eggs. If you have or have had breast cancer, your doctor might advise you not to take the contraceptive pill. This is because the hormones in it might affect the cancer. It is not known for sure whether or not chemotherapy drugs can be passed on through semen or secretions from the vagina. Because of this some doctors advise people to use a barrier method if you have sex during treatment. This applies to vaginal, anal or oral sex. Generally, doctors advise a barrier method only for the time you are actually having the treatment and for about a week after your treatment. Advice like this can be worrying, but this does not mean that you have to avoid being intimate with your partner. You can still have close contact with your partner and continue to enjoy sex.  Animals If you have cats or birds we just ask that you not change the litter or change the cage.  Please have someone else do  this for you while you are on chemotherapy.   Food Safety During and After Cancer Treatment Food safety is important for people both during and after cancer treatment. Cancer and cancer treatments, such as chemotherapy, radiation therapy, and stem cell/bone marrow transplantation, often weaken the immune system. This makes it harder for your body to protect itself from foodborne illness,  also called food poisoning. Foodborne illness is caused by eating food that contains harmful bacteria, parasites, or viruses.  Foods to avoid Some foods have a higher risk of becoming tainted with bacteria. These include: Unwashed fresh fruit and vegetables, especially leafy vegetables that can hide dirt and other contaminants Raw sprouts, such as alfalfa sprouts Raw or undercooked beef, especially ground beef, or other raw or undercooked meat and poultry Fatty, fried, or spicy foods immediately before or after treatment.  These can sit heavy on your stomach and make you feel nauseous. Raw or undercooked shellfish, such as oysters. Sushi and sashimi, which often contain raw fish.  Unpasteurized beverages, such as unpasteurized fruit juices, raw milk, raw yogurt, or cider Undercooked eggs, such as soft boiled, over easy, and poached; raw, unpasteurized eggs; or foods made with raw egg, such as homemade raw cookie dough and homemade mayonnaise  Simple steps for food safety  Shop smart. Do not buy food stored or displayed in an unclean area. Do not buy bruised or damaged fruits or vegetables. Do not buy cans that have cracks, dents, or bulges. Pick up foods that can spoil at the end of your shopping trip and store them in a cooler on the way home.  Prepare and clean up foods carefully. Rinse all fresh fruits and vegetables under running water, and dry them with a clean towel or paper towel. Clean the top of cans before opening them. After preparing food, wash your hands for 20 seconds with hot water and soap. Pay special attention to areas between fingers and under nails. Clean your utensils and dishes with hot water and soap. Disinfect your kitchen and cutting boards using 1 teaspoon of liquid, unscented bleach mixed into 1 quart of water.    Dispose of old food. Eat canned and packaged food before its expiration date (the "use by" or "best before" date). Consume refrigerated leftovers  within 3 to 4 days. After that time, throw out the food. Even if the food does not smell or look spoiled, it still may be unsafe. Some bacteria, such as Listeria, can grow even on foods stored in the refrigerator if they are kept for too long.  Take precautions when eating out. At restaurants, avoid buffets and salad bars where food sits out for a long time and comes in contact with many people. Food can become contaminated when someone with a virus, often a norovirus, or another "bug" handles it. Put any leftover food in a "to-go" container yourself, rather than having the server do it. And, refrigerate leftovers as soon as you get home. Choose restaurants that are clean and that are willing to prepare your food as you order it cooked.   AT HOME MEDICATIONS:  Compazine/Prochlorperazine 10mg  tablet. Take 1 tablet every 6 hours as needed for nausea/vomiting. (This can make you sleepy)   EMLA cream. Apply a quarter size amount to port site 1 hour prior to chemo. Do not rub in. Cover with plastic wrap.    Diarrhea Sheet   If you are having loose stools/diarrhea, please purchase Imodium and begin taking as outlined:  At the first sign of poorly formed or loose stools you should begin taking Imodium (loperamide) 2 mg capsules.  Take two tablets (4mg ) followed by one tablet (2mg ) every 2 hours - DO NOT EXCEED 8 tablets in 24 hours.  If it is bedtime and you are having loose stools, take 2 tablets at bedtime, then 2 tablets every 4 hours until morning.   Always call the Cancer Center if you are having loose stools/diarrhea that you can't get under control.  Loose stools/diarrhea leads to dehydration (loss of water) in your body.  We have other options of trying to get the loose stools/diarrhea to stop but you must let us know!   Constipation  Sheet  Colace - 100 mg capsules - take 2 capsules daily.  If this doesn't help then you can increase to 2 capsules twice daily.  Please call if the above does not work for you. Do not go more than 2 days without a bowel movement.  It is very important that you do not become constipated.  It will make you feel sick to your stomach (nausea) and can cause abdominal pain and vomiting.  Nausea Sheet   Compazine/Prochlorperazine 10mg  tablet. Take 1 tablet every 6 hours as needed for nausea/vomiting (This can make you drowsy).  If you are having persistent nausea (nausea that does not stop) please call the Cancer Center and let us know the amount of nausea that you are experiencing.  If you begin to vomit, you need to call the Cancer Center and if it is the weekend and you have vomited more than one time and can't get it to stop-go to the Emergency Room.  Persistent nausea/vomiting can lead to dehydration (loss of fluid in your body) and will make you feel very weak and unwell. Ice chips, sips of clear liquids, foods that are at room temperature, crackers, and toast tend to be better tolerated.   SYMPTOMS TO REPORT AS SOON AS POSSIBLE AFTER TREATMENT:  FEVER GREATER THAN 100.4 F  CHILLS WITH OR WITHOUT FEVER  NAUSEA AND VOMITING THAT IS NOT CONTROLLED WITH YOUR NAUSEA MEDICATION  UNUSUAL SHORTNESS OF BREATH  UNUSUAL BRUISING OR BLEEDING  TENDERNESS IN MOUTH AND THROAT WITH OR WITHOUT PRESENCE OF ULCERS  URINARY PROBLEMS  BOWEL PROBLEMS  UNUSUAL RASH      Wear comfortable clothing and clothing appropriate for easy access to any Portacath or PICC line. Let us know if there is anything that we can do to make your therapy better!    What to do if you need assistance after hours or on the weekends: CALL [phone].  HOLD on the line, do not hang up.  You will hear multiple messages but at the end you will be connected with a nurse triage line.  They will contact the doctor if necessary.   Most of the time they will be able to assist you.  Do not call the hospital operator.      I have been informed and understand all of the instructions given to me and have received a copy. I have been instructed to call the clinic [phone] or my family physician as soon as possible for continued medical care, if indicated. I do not have any more questions at this time but understand that I may call the Cancer Center or the Patient Navigator at [phone] during office hours should I have questions or need assistance in obtaining follow-up care.        Gemcitabine (Gemzar)  About This Drug Gemcitabine is used to treat cancer. It is given in the vein (IV).  It will take 30 minutes to infuse.  Possible Side Effects  Bone marrow suppression. This is a decrease in the number of white blood cells, red blood cells, and platelets. This may raise your risk of infection, make you tired and weak (fatigue), and raise your risk of bleeding.   Fever   Trouble breathing   Nausea and throwing up (vomiting)   Changes in your liver function   Increased protein in your urine, which can affect how your kidneys work   Blood in your urine   Rash   Swelling of your legs, ankles and/or feet  Note: Each of the side effects above was reported in 20% or greater of patients treated with Gemcitabine. Not all possible side effects are included above.  Warnings and Precautions  Severe bone marrow suppression   Inflammation (swelling) of the lungs and/or thickening of the lung tissues, which may be lifethreatening. You may have a dry cough or trouble breathing.   Changes in your kidney function, which can cause kidney failure   Changes in your liver function, which can cause liver failure and may be life-threatening   If you have received radiation treatments, your skin may become red and/or you may develop soreness of the mouth and throat after gemcitabine. This reaction is called  "recall." Your body is recalling, or remembering, that it had radiation therapy.   A syndrome where fluid from your veins can leak into your tissues and cause a decrease in your blood pressure and fluid to accumulate in your tissues and/or lungs.   A syndrome can occur that causes changes to kidney and liver function in combination with a decrease in red blood cells. Kidney failure may result which may be life-threatening.   Changes in your central nervous system can happen. The central nervous system is made up of your brain and spinal cord. You could feel extreme tiredness, agitation, confusion, hallucinations (see or hear things that are not there), have trouble understanding or speaking, loss of control of your bowels or bladder, eyesight changes, numbness or lack of strength to your arms, legs, face, or body, seizures or coma. If you start to have any of these symptoms let your doctor know right away.  Note: Some of the side effects above are very rare. If you have concerns and/or questions, please discuss them with your medical team.  Important Information  This drug may be present in the saliva, tears, sweat, urine, stool, vomit, semen, and vaginal secretions. Talk to your doctor and/or your nurse about the necessary precautions to take during this time.  Treating Side Effects  Manage tiredness by pacing your activities for the day.   Be sure to include periods of rest between energy-draining activities.   To decrease the risk of infection, wash your hands regularly.   Avoid close contact with people who have a cold, the flu, or other infections.   Take your temperature as your doctor or nurse tells you, and whenever you feel like you may have a  fever.   To help decrease bleeding, use a soft toothbrush. Check with your nurse before using dental floss.   Be very careful when using knives or tools.   Use an electric shaver instead of a razor.   Drink plenty of fluids (a minimum of  eight glasses per day is recommended).   If you throw up or have loose bowel movements, you should drink more fluids so that you do not become dehydrated (lack of water in the body from losing too much fluid).   To help with nausea and vomiting, eat small, frequent meals instead of three large meals a day. Choose foods and drinks that are at room temperature. Ask your nurse or doctor about other helpful tips and medicine that is available to help stop or lessen these symptoms.   If you get a rash do not put anything on it unless your doctor or nurse says you may. Keep the area around the rash clean and dry. Ask your doctor for medicine if your rash bothers you.   If you received radiation, and your skin becomes red or irritated again, or you develop soreness of the mouth and throat, follow the same care instructions you did during radiation treatment. Be sure to tell the nurse or doctor administering your chemotherapy about your skin changes.  Food and Drug Interactions  There are no known interactions of gemcitabine with food.   This drug may interact with other medicines. Tell your doctor and pharmacist about all the prescription and over-the-counter medicines and dietary supplements (vitamins, minerals, herbs and others) that you are taking at this time. Also, check with your doctor or pharmacist before starting any new prescription or over-the-counter medicines, or dietary supplements to make sure that there are no interactions.  When to Call the Doctor Call your doctor or nurse if you have any of these symptoms and/or any new or unusual symptoms:   Fever of 100.4 F (38 C) or higher   Chills   Tiredness that interferes with your daily activities   Feeling dizzy or lightheaded   Pain in your chest   Dry cough   Wheezing and/or trouble breathing   Confusion and/or agitation   Symptoms of a seizure such as confusion, blacking out, passing out, loss of hearing or vision, blurred  vision, unusual smells or tastes (such as burning rubber), trouble talking, tremors or shaking in parts or all of the body, repeated body movements, tense muscles that do not relax, and loss of control of urine and bowels. If you or your family member suspects you are having a seizure, call 911 right away.   Hallucinations   Trouble understanding or speaking   Blurry vision or changes in your eyesight   Numbness or lack of strength to your arms, legs, face, or body   Easy bleeding or bruising   Nausea that stops you from eating or drinking and/or is not relieved by prescribed medicines   Throwing up    Swelling of legs, ankles, or feet   Weight gain of 5 pounds in one week (fluid retention)   Blood in urine   Decreased urine or very dark urine   Foamy or bubbly-looking urine   A new rash/itching or a rash that is not relieved by prescribed medicines   Signs of possible liver problems: dark urine, pale bowel movements, bad stomach pain, feeling very tired and weak, unusual itching, or yellowing of the eyes or skin   If you think you may  be pregnant or may have impregnated your partner  Reproduction Warnings   Pregnancy warning: This drug can have harmful effects on the unborn baby. Women of childbearing potential should use effective methods of birth control during your cancer treatment and for 6 months after treatment. Men with female partners of childbearing potential should use effective methods of birth control during your cancer treatment and for 3 months after your cancer treatment. Let your doctor know right away if you think you may be pregnant or may have impregnated your partner.   Breastfeeding warning: Women should not breastfeed during treatment and for 1 week after treatment because this drug could enter the breast milk and cause harm to a breastfeeding baby.   Fertility warning: In men, this drug may affect your ability to have children in the future. Talk with  your doctor or nurse if you plan to have children. Ask for information on sperm banking.   SELF CARE ACTIVITIES WHILE RECEIVING CHEMOTHERAPY:  Hydration Increase your fluid intake and drink at least 8 to 12 cups (64 ounces) of water/decaffeinated beverages per day after treatment. You can still have your cup of coffee or soda but these beverages do not count as part of your 8 to 12 cups that you need to drink daily. No alcohol intake.  Medications Continue taking your normal prescription medication as prescribed.  If you start any new herbal or new supplements please let us know first to make sure it is safe.  Mouth Care Have teeth cleaned professionally before starting treatment. Keep dentures and partial plates clean. Use soft toothbrush and do not use mouthwashes that contain alcohol. Biotene is a good mouthwash that is available at most pharmacies or may be ordered by calling [phone]. Use warm salt water gargles (1 teaspoon salt per 1 quart warm water) before and after meals and at bedtime. If you need dental work, please let the doctor know before you go for your appointment so that we can coordinate the best possible time for you in regards to your chemo regimen. You need to also let your dentist know that you are actively taking chemo. We may need to do labs prior to your dental appointment.  Skin Care Always use sunscreen that has not expired and with SPF (Sun Protection Factor) of 50 or higher. Wear hats to protect your head from the sun. Remember to use sunscreen on your hands, ears, face, & feet.  Use good moisturizing lotions such as udder cream, eucerin, or even Vaseline. Some chemotherapies can cause dry skin, color changes in your skin and nails.    Avoid long, hot showers or baths. Use gentle, fragrance-free soaps and laundry detergent. Use moisturizers, preferably creams or ointments rather than lotions because the thicker consistency is better at preventing skin  dehydration. Apply the cream or ointment within 15 minutes of showering. Reapply moisturizer at night, and moisturize your hands every time after you wash them.  Hair Loss (if your doctor says your hair will fall out)  If your doctor says that your hair is likely to fall out, decide before you begin chemo whether you want to wear a wig. You may want to shop before treatment to match your hair color. Hats, turbans, and scarves can also camouflage hair loss, although some people prefer to leave their heads uncovered. If you go bare-headed outdoors, be sure to use sunscreen on your scalp. Cut your hair short. It eases the inconvenience of shedding lots of hair, but it also  can reduce the emotional impact of watching your hair fall out. Don't perm or color your hair during chemotherapy. Those chemical treatments are already damaging to hair and can enhance hair loss. Once your chemo treatments are done and your hair has grown back, it's OK to resume dyeing or perming hair.  With chemotherapy, hair loss is almost always temporary. But when it grows back, it may be a different color or texture. In older adults who still had hair color before chemotherapy, the new growth may be completely gray.  Often, new hair is very fine and soft.  Infection Prevention Please wash your hands for at least 30 seconds using warm soapy water. Handwashing is the #1 way to prevent the spread of germs. Stay away from sick people or people who are getting over a cold. If you develop respiratory systems such as green/yellow mucus production or productive cough or persistent cough let us know and we will see if you need an antibiotic. It is a good idea to keep a pair of gloves on when going into grocery stores/[employer] to decrease your risk of coming into contact with germs on the carts, etc. Carry alcohol hand gel with you at all times and use it frequently if out in public. If your temperature reaches 100.4 or higher please call the  clinic and let us know.  If it is after hours or on the weekend please go to the ER if your temperature is over 100.4.  Please have your own personal thermometer at home to use.    Sex and bodily fluids If you are going to have sex, a condom must be used to protect the person that isn't taking chemotherapy. Chemo can decrease your libido (sex drive). For a few days after chemotherapy, chemotherapy can be excreted through your bodily fluids.  When using the toilet please close the lid and flush the toilet twice.  Do this for a few day after you have had chemotherapy.   Effects of chemotherapy on your sex life Some changes are simple and won't last long. They won't affect your sex life permanently.  Sometimes you may feel: too tired not strong enough to be very active sick or sore  not in the mood anxious or low  Your anxiety might not seem related to sex. For example, you may be worried about the cancer and how your treatment is going. Or you may be worried about money, or about how you family are coping with your illness.  These things can cause stress, which can affect your interest in sex. It's important to talk to your partner about how you feel.  Remember - the changes to your sex life don't usually last long. There's usually no medical reason to stop having sex during chemo. The drugs won't have any long term physical effects on your performance or enjoyment of sex. Cancer can't be passed on to your partner during sex  Contraception It's important to use reliable contraception during treatment. Avoid getting pregnant while you or your partner are having chemotherapy. This is because the drugs may harm the baby. Sometimes chemotherapy drugs can leave a man or woman infertile.  This means you would not be able to have children in the future. You might want to talk to someone about permanent infertility. It can be very difficult to learn that you may no longer be able to have children. Some  people find counselling helpful. There might be ways to preserve your fertility, although this is  easier for men than for women. You may want to speak to a fertility expert. You can talk about sperm banking or harvesting your eggs. You can also ask about other fertility options, such as donor eggs. If you have or have had breast cancer, your doctor might advise you not to take the contraceptive pill. This is because the hormones in it might affect the cancer. It is not known for sure whether or not chemotherapy drugs can be passed on through semen or secretions from the vagina. Because of this some doctors advise people to use a barrier method if you have sex during treatment. This applies to vaginal, anal or oral sex. Generally, doctors advise a barrier method only for the time you are actually having the treatment and for about a week after your treatment. Advice like this can be worrying, but this does not mean that you have to avoid being intimate with your partner. You can still have close contact with your partner and continue to enjoy sex.  Animals If you have cats or birds we just ask that you not change the litter or change the cage.  Please have someone else do this for you while you are on chemotherapy.   Food Safety During and After Cancer Treatment Food safety is important for people both during and after cancer treatment. Cancer and cancer treatments, such as chemotherapy, radiation therapy, and stem cell/bone marrow transplantation, often weaken the immune system. This makes it harder for your body to protect itself from foodborne illness, also called food poisoning. Foodborne illness is caused by eating food that contains harmful bacteria, parasites, or viruses.  Foods to avoid Some foods have a higher risk of becoming tainted with bacteria. These include: Unwashed fresh fruit and vegetables, especially leafy vegetables that can hide dirt and other contaminants Raw sprouts, such as alfalfa  sprouts Raw or undercooked beef, especially ground beef, or other raw or undercooked meat and poultry Fatty, fried, or spicy foods immediately before or after treatment.  These can sit heavy on your stomach and make you feel nauseous. Raw or undercooked shellfish, such as oysters. Sushi and sashimi, which often contain raw fish.  Unpasteurized beverages, such as unpasteurized fruit juices, raw milk, raw yogurt, or cider Undercooked eggs, such as soft boiled, over easy, and poached; raw, unpasteurized eggs; or foods made with raw egg, such as homemade raw cookie dough and homemade mayonnaise  Simple steps for food safety  Shop smart. Do not buy food stored or displayed in an unclean area. Do not buy bruised or damaged fruits or vegetables. Do not buy cans that have cracks, dents, or bulges. Pick up foods that can spoil at the end of your shopping trip and store them in a cooler on the way home.  Prepare and clean up foods carefully. Rinse all fresh fruits and vegetables under running water, and dry them with a clean towel or paper towel. Clean the top of cans before opening them. After preparing food, wash your hands for 20 seconds with hot water and soap. Pay special attention to areas between fingers and under nails. Clean your utensils and dishes with hot water and soap. Disinfect your kitchen and cutting boards using 1 teaspoon of liquid, unscented bleach mixed into 1 quart of water.    Dispose of old food. Eat canned and packaged food before its expiration date (the "use by" or "best before" date). Consume refrigerated leftovers within 3 to 4 days. After that time, throw out the  food. Even if the food does not smell or look spoiled, it still may be unsafe. Some bacteria, such as Listeria, can grow even on foods stored in the refrigerator if they are kept for too long.  Take precautions when eating out. At restaurants, avoid buffets and salad bars where food sits out for a long time  and comes in contact with many people. Food can become contaminated when someone with a virus, often a norovirus, or another "bug" handles it. Put any leftover food in a "to-go" container yourself, rather than having the server do it. And, refrigerate leftovers as soon as you get home. Choose restaurants that are clean and that are willing to prepare your food as you order it cooked.   AT HOME MEDICATIONS:                                                                                                                                                                Compazine/Prochlorperazine 10mg  tablet. Take 1 tablet every 6 hours as needed for nausea/vomiting. (This can make you sleepy)   EMLA cream. Apply a quarter size amount to port site 1 hour prior to chemo. Do not rub in. Cover with plastic wrap.    Diarrhea Sheet   If you are having loose stools/diarrhea, please purchase Imodium and begin taking as outlined:  At the first sign of poorly formed or loose stools you should begin taking Imodium (loperamide) 2 mg capsules.  Take two tablets (4mg ) followed by one tablet (2mg ) every 2 hours - DO NOT EXCEED 8 tablets in 24 hours.  If it is bedtime and you are having loose stools, take 2 tablets at bedtime, then 2 tablets every 4 hours until morning.   Always call the Cancer Center if you are having loose stools/diarrhea that you can't get under control.  Loose stools/diarrhea leads to dehydration (loss of water) in your body.  We have other options of trying to get the loose stools/diarrhea to stop but you must let us know!   Constipation Sheet  Colace - 100 mg capsules - take 2 capsules daily.  If this doesn't help then you can increase to 2 capsules twice daily.  Please call if the above does not work for you. Do not go more than 2 days without a bowel movement.  It is very important that you do not become constipated.  It will make you feel sick to your stomach (nausea) and can cause  abdominal pain and vomiting.  Nausea Sheet   Compazine/Prochlorperazine 10mg  tablet. Take 1 tablet every 6 hours as needed for nausea/vomiting (This can make you drowsy).  If you are having persistent nausea (nausea that does not stop) please call the Cancer Center and let us know the amount of nausea  that you are experiencing.  If you begin to vomit, you need to call the Cancer Center and if it is the weekend and you have vomited more than one time and can't get it to stop-go to the Emergency Room.  Persistent nausea/vomiting can lead to dehydration (loss of fluid in your body) and will make you feel very weak and unwell. Ice chips, sips of clear liquids, foods that are at room temperature, crackers, and toast tend to be better tolerated.   SYMPTOMS TO REPORT AS SOON AS POSSIBLE AFTER TREATMENT:  FEVER GREATER THAN 100.4 F  CHILLS WITH OR WITHOUT FEVER  NAUSEA AND VOMITING THAT IS NOT CONTROLLED WITH YOUR NAUSEA MEDICATION  UNUSUAL SHORTNESS OF BREATH  UNUSUAL BRUISING OR BLEEDING  TENDERNESS IN MOUTH AND THROAT WITH OR WITHOUT PRESENCE OF ULCERS  URINARY PROBLEMS  BOWEL PROBLEMS  UNUSUAL RASH      Wear comfortable clothing and clothing appropriate for easy access to any Portacath or PICC line. Let us know if there is anything that we can do to make your therapy better!    What to do if you need assistance after hours or on the weekends: CALL [phone].  HOLD on the line, do not hang up.  You will hear multiple messages but at the end you will be connected with a nurse triage line.  They will contact the doctor if necessary.  Most of the time they will be able to assist you.  Do not call the hospital operator.      I have been informed and understand all of the instructions given to me and have received a copy. I have been instructed to call the clinic [phone] or my family physician as soon as possible for continued medical care, if indicated. I do not have any  more questions at this time but understand that I may call the Cancer Center or the Patient Navigator at [phone] during office hours should I have questions or need assistance in obtaining follow-up care.

## 2023-01-07 NOTE — Progress Notes (Signed)
49  [facility] [address], [place] [address]    Clinic Day:  01/07/23   Referring physician: Ignatius Specking, MD  Patient Care Team: Ignatius Specking, MD as PCP - General (Internal Medicine) Doreatha Massed, MD as Medical Oncologist (Oncology)   ASSESSMENT & PLAN:   Assessment: 1.  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 -Robotic assisted laparoscopic TAH and BSO and omentectomy on 12/24/2018, pathology showing high-grade serous carcinoma, PT3P NX. -Germline mutation testing was negative. -3 more cycles of adjuvant chemotherapy completed on 03/15/2019. -CTAP on 04/13/2019 showed no findings of active malignancy.  28% reduction in the volume of presumed chronic hematoma/chronic fluid collection splaying the upper margin of the spleen.  Large type III hiatal hernia. -CTAP on 10/13/2019 shows no findings of recurrence or metastatic disease. - Foundation 1 shows HRD+, LOH score>16%.  MSI-stable.  MYC amplification.  T p53 mutation. - We reviewed CT CAP from 02/21/2021 which showed progressive peritoneal metastasis compared to 12/06/2020 scan.  No bowel obstruction.  Small right pleural effusion with mild enlargement of an isolated left external iliac lymph node. - Reviewed EGD from 02/28/2021 which showed hiatal hernia and normal findings. - She reported epigastric and left upper quadrant pain worse in the last 1 week which is related to progression of her malignancy. - 6 cycles of carboplatin and paclitaxel from 03/13/2021 through 06/18/2021 - CT CAP (07/12/2021): No evidence of recurrence or metastatic disease. - Olaparib 300 mg twice daily from 08/22/2021 through 11/28/2022 with progression    Plan: 1.  Clinical stage IVa high-grade serous ovarian cancer, positive cytology of left pleural effusion, HRD positive: - She was recently  hospitalized for intractable pain.  I have reviewed hospitalization records. - She has developed abdominal distention from ascites.  Last paracentesis was on 02/24/2022 with 2 L fluid removed. - She has abdominal distention again with reaccumulation of fluid.  We will schedule paracentesis for Friday. - I would also recommend giving albumin 50 g IV at the time of paracentesis. - If she becomes uncomfortable in the next couple of days, she was told to go to the ER to have it drained. - Reviewed labs today: LFTs are fairly normal with low albumin and elevated alk phos.  Creatinine normal.  CBC was normal except elevated white count.  She does not have any signs of infection.  She is not on any steroids.  Elevated white count could be from malignancy. - She will proceed with cycle 1 day 1 of treatment with carboplatin, gemcitabine and bevacizumab.  Will dose reduce chemotherapy. - She also has hyponatremia, from decreased oral intake.  She is drinking 1-can of protein shake per day.  We will give her 250 mL of normal saline today. - RTC on day 8.   2.  Lower back/left upper quadrant pain: - Continue oxycodone 36 mg every 6 hours.  She is also taking Dilaudid 8 mg every 8 hours.  She usually takes half tablet of Dilaudid.  This is controlling her pain well.   3.  Numbness in the feet/cramping in the hands: - Continue gabapentin 200 mg at bedtime.  4.  Iron deficiency state: - Last Feraheme on 06/24/2022.  Hemoglobin is 12.1 today.  5.  Constipation: - Continue stool softeners daily as needed.  6.  Hypomagnesemia: - Continue magnesium twice daily.    Orders Placed This Encounter  Procedures   US Paracentesis    Albumin 50 g    Standing Status:   Future    Expiration Date:   01/07/2024    If therapeutic, is there a maximum amount of fluid to be removed?:   No    Are labs required for specimen collection?:   No    Is Albumin medication needed?:   Yes    A seperate Albumin medication order  is required.:   I understand I need to place a separate order for albumin    Reason for Exam (SYMPTOM  OR DIAGNOSIS REQUIRED):   malignant ascites    Preferred imaging location?:   [facility]   US Paracentesis    Albumin 50 g    Standing Status:   Future    Expiration Date:   01/07/2024    If therapeutic, is there a maximum amount of fluid to be removed?:   No    Are labs required for specimen collection?:   No    Is Albumin medication needed?:   Yes    A seperate Albumin medication order is required.:   I understand I need to place a separate order for albumin    Reason for Exam (SYMPTOM  OR DIAGNOSIS REQUIRED):   malignant ascites    Preferred imaging location?:   [facility]   US Paracentesis    Albumin 50 g    Standing Status:   Future    Expiration Date:   01/07/2024    If therapeutic, is there a maximum amount of fluid to be removed?:   No    Are labs required for specimen collection?:   No    Is Albumin medication needed?:   Yes    A seperate Albumin medication order is required.:   I understand I need to place a separate order for albumin    Reason for Exam (SYMPTOM  OR DIAGNOSIS REQUIRED):   malignant ascites    Preferred imaging location?:   [facility]   US Paracentesis    Albumin 50 g    Standing Status:   Future    Expiration Date:   01/07/2024    If therapeutic, is there a maximum amount of fluid to be removed?:   No    Are labs required for specimen collection?:   No    Is Albumin medication needed?:   Yes    A seperate Albumin medication order is required.:   I understand I need to place a separate order for albumin    Reason for Exam (SYMPTOM  OR DIAGNOSIS REQUIRED):   malignant ascites    Preferred imaging location?:   [facility]   US Paracentesis    Albumin 50g    Standing Status:   Future    Expiration Date:   01/07/2024    If therapeutic, is there a maximum amount of fluid to be removed?:   No    Are labs required for  specimen collection?:   No    Is Albumin medication needed?:   Yes    A seperate Albumin medication order is required.:   I understand I need to place a separate order for albumin    Reason for Exam (SYMPTOM  OR DIAGNOSIS REQUIRED):   malignant ascites    Preferred imaging location?:   [facility] R Teague,acting as a scribe for Doreatha Massed, MD.,have documented all relevant documentation on the behalf of  Doreatha Massed, MD,as directed by  Doreatha Massed, MD while in the presence of Doreatha Massed, MD.  I, Doreatha Massed MD, have reviewed the above documentation for accuracy and completeness, and I agree with the above.       Doreatha Massed, MD   12/24/202412:39 PM  CHIEF COMPLAINT:   Diagnosis: high-grade serous ovarian cancer    Cancer Staging  No matching staging information was found for the patient.    Prior Therapy: 1. Carboplatin and paclitaxel x 7 cycles from 08/24/2018 to 03/15/2019. 2. Laparoscopic TAH & BSO & omenectomy on 12/24/2018. 3.  6 cycles of carboplatin and paclitaxel from 03/05/2021 to 06/18/2021  Current Therapy:  olaparib    HISTORY OF PRESENT ILLNESS:   Oncology History  Ovarian cancer, bilateral (HCC)  07/07/2018 Pathology Results   PLEURAL FLUID, LEFT (SPECIMEN 1 OF 1, COLLECTED 07/07/18): - MALIGNANT CELLS CONSISTENT WITH ADENOCARCINOMA - SEE COMMENT  Source Pleural Fluid, (Specimen 1 of 1, collected on 07/07/2018) Gross Specimen: Received is/are 1000cc of bloody red fluid with tissue. (TC:tc) Prepared: # Smears: 0 # Concentration Technique Slides (i.e. ThinPrep): 1 # Cell Block: 1 Conventional Additional Studies: Two Hematology slides labeled [id number] Comment The malignant cells are positive for cytokeratin 7, p53, WT-1, Pax-8, Moc31, ER (weak) and EMA but negative for cytokeratin 20, TTF-1, GATA-3, CDX-2 and D2-40. Overall, the phenotype is consistent with a gynecologic primary; clinical  correlation recommended.   07/07/2018 Procedure   Successful ultrasound guided left thoracentesis yielding 2.0 L of pleural fluid   07/08/2018 Procedure   1. Technically successful placement of left 14 French pigtail chest drain, placed to Pleur-evac water-seal.   07/08/2018 Procedure   1. Technically successful five [place] double lumen power injectable PICC placement   07/09/2018 Imaging   Ct chest 1. There is a moderate, loculated left hydropneumothorax with a small air component and moderate fluid component. The largest loculated component is located posteriorly. There is a pigtail drainage catheter about the lateral pleural space. There is no obvious etiology, such as obvious mass or pleural disease.   2. There is a small right pleural effusion with associated atelectasis or consolidation and a subpleural consolidation of the superior segment right lower lobe (series 4, image 56), of uncertain significance, possibly infectious or inflammatory   07/10/2018 Imaging   Ct abdomen and pelvis: 1. The bilateral ovaries are enlarged by heterogeneous appearing cystic lesions, measuring 5.3 x 4.2 cm on the right (series 4, image 72) and 4.5 x 3.2 cm on the left (series 4, image 75). Consider dedicated pelvic ultrasound and/or pelvic MRI to further evaluate for solid components given high suspicion for GYN primary malignancy.   2. No other evidence of mass and no lymphadenopathy in the abdomen or pelvis.   3. Trace ascites. There is some suggestion of omental and peritoneal nodularity (e.g. Series 4, image 43), concerning for peritoneal metastatic disease.    4. Loculated left-sided pleural effusion with left-sided pleural drainage catheter in position. Small right pleural effusion   07/13/2018 Surgery   OPERATION: 1.  Left VATS (video-assisted thoracoscopic surgery) for drainage of loculated pleural effusion. 2.  Talc pleurodesis for malignant pleural effusion. 3.  Placement of PleurX catheter for  management of malignant pleural effusion. 4.  Placement of On-Q analgesia catheter system.    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  07/13/2018 Pathology Results   Pleura, peel, Left Pleural - FIBRO-FIBRINOUS PLEURITIS - NEGATIVE FOR MALIGNANCY   07/18/2018 Initial Diagnosis   Ovarian cancer, bilateral (HCC)   07/20/2018 Procedure   EGD impression: Normal proximal esophagus and mid esophagus. Mild distal esophageal rings; dilation not performed because of esophagitis. LA Grade C reflux esophagitis. Z-line regular, 30 cm from the incisors. 5 cm hiatal hernia. Non-bleeding gastric ulcer with no stigmata of bleeding. Gastritis. Duodenal erosions without bleeding. Normal second portion of the duodenum. No specimens collected.   08/19/2018 Genetic Testing   Negative genetic testing on the common hereditary cancer panel.  The Common Hereditary Gene Panel offered by [facility] includes sequencing and/or deletion duplication testing of the following 48 genes: APC, ATM, AXIN2, BARD1, BMPR1A, BRCA1, BRCA2, BRIP1, CDH1, CDK4, CDKN2A (p14ARF), CDKN2A (p16INK4a), CHEK2, CTNNA1, DICER1, EPCAM (Deletion/duplication testing only), GREM1 (promoter region deletion/duplication testing only), KIT, MEN1, MLH1, MSH2, MSH3, MSH6, MUTYH, NBN, NF1, NHTL1, PALB2, PDGFRA, PMS2, POLD1, POLE, PTEN, RAD50, RAD51C, RAD51D, RNF43, SDHB, SDHC, SDHD, SMAD4, SMARCA4. STK11, TP53, TSC1, TSC2, and VHL.  The following genes were evaluated for sequence changes only: SDHA and HOXB13 c.251G>A variant only. The report date is August 19, 2018.   08/24/2018 - 03/15/2019 Chemotherapy   Patient is on Treatment Plan : OVARIAN Carboplatin (AUC 6) / Paclitaxel (175) q21d x 6 cycles     03/05/2021 - 06/18/2021 Chemotherapy   Patient is on Treatment Plan :  OVARIAN Carboplatin (AUC 6) / Paclitaxel (175) q21d x 6 cycles     01/07/2023 -  Chemotherapy   Patient is on Treatment Plan : OVARIAN RECURRENT 3RD LINE Carboplatin D1 + Gemcitabine D1,8 (4/800) q21d        INTERVAL HISTORY:   Michelle Holt is a 52 y.o. female presenting to clinic today for follow up of high-grade serous ovarian cancer. She was last seen by me on 11/28/22.  Since her last visit, she was admitted to the hospital on 12/15/22 and 12/24/22 for generalized abdominal pain. She was discharged on gabapentin 300 mg 3 times daily, oxycodone ER 36 mg 4 times a day, diazepam 5 mg daily at bedtime, and Dilaudid 8 mg every 4 hours as needed for breakthrough pain.  Today, she states that she is doing well overall. Her appetite level is at 0%. Her energy level is at 0%. She is accompanied by her daughter. She reports she takes oxycodone as prescribed and Dilaudid doses in half, as the medication makes her drowsy and nauseated. She denies any fever. She notes occasional drenching night sweats. She is drinking 1 to 1.5 30 mg protein shakes. She is not eating any foods.   PAST MEDICAL HISTORY:   Past Medical History: Past Medical History:  Diagnosis Date   Anemia    Anxiety and depression    Arthritis of facet joints at multiple vertebral levels    L5-S1   Constipation    Dyslipidemia    Family history of breast cancer    Family history of uterine cancer    GERD (gastroesophageal reflux disease)    History of hiatal hernia    History of kidney stones    Insomnia    Irritable bowel syndrome    Migraine    Muscle tension headache    Neuropathy of finger    Ovarian carcinoma (HCC)    ovarian   Plantar fasciitis of right foot    Port-A-Cath in place 08/20/2018    Surgical History: Past Surgical History:  Procedure Laterality Date  ABDOMINAL HYSTERECTOMY     BIOPSY  10/27/2020   Procedure: BIOPSY;  Surgeon: Dolores Frame, MD;  [facility];  Service:  Gastroenterology;;   CHOLECYSTECTOMY  2008   COLONOSCOPY N/A 08/13/2013   Procedure: COLONOSCOPY;  Surgeon: Malissa Hippo, MD;  [facility];  Service: Endoscopy;  Laterality: N/A;  230-moved to 145 Ann to notify pt   COLONOSCOPY WITH PROPOFOL N/A 11/15/2020   Procedure: COLONOSCOPY WITH PROPOFOL;  Surgeon: Malissa Hippo, MD;  [facility];  Service: Endoscopy;  Laterality: N/A;  1:40   ESOPHAGEAL DILATION N/A 02/28/2021   Procedure: ESOPHAGEAL DILATION;  Surgeon: Malissa Hippo, MD;  [facility];  Service: Endoscopy;  Laterality: N/A;   ESOPHAGOGASTRODUODENOSCOPY     ESOPHAGOGASTRODUODENOSCOPY (EGD) WITH PROPOFOL N/A 07/20/2018   Procedure: ESOPHAGOGASTRODUODENOSCOPY (EGD) WITH PROPOFOL;  Surgeon: Malissa Hippo, MD;  [facility];  Service: Endoscopy;  Laterality: N/A;  Possible esophageal dilation.   ESOPHAGOGASTRODUODENOSCOPY (EGD) WITH PROPOFOL N/A 10/27/2020   Procedure: ESOPHAGOGASTRODUODENOSCOPY (EGD) WITH PROPOFOL;  Surgeon: Dolores Frame, MD;  [facility];  Service: Gastroenterology;  Laterality: N/A;  2:10, pt knows to arrive at 10:15   ESOPHAGOGASTRODUODENOSCOPY (EGD) WITH PROPOFOL N/A 02/28/2021   Procedure: ESOPHAGOGASTRODUODENOSCOPY (EGD) WITH PROPOFOL;  Surgeon: Malissa Hippo, MD;  [facility];  Service: Endoscopy;  Laterality: N/A;  200 ASA 1   ESOPHAGOGASTRODUODENOSCOPY (EGD) WITH PROPOFOL N/A 03/29/2022   Procedure: ESOPHAGOGASTRODUODENOSCOPY (EGD) WITH PROPOFOL;  Surgeon: Dolores Frame, MD;  [facility];  Service: Gastroenterology;  Laterality: N/A;  2:00 pm, asa 1-2   GIVENS CAPSULE STUDY N/A 11/24/2020   Procedure: GIVENS CAPSULE STUDY;  Surgeon: Malissa Hippo, MD;  [facility];  Service: Endoscopy;  Laterality: N/A;  7:30   IR ANGIOGRAM SELECTIVE EACH ADDITIONAL VESSEL  08/01/2018   IR ANGIOGRAM VISCERAL SELECTIVE  08/01/2018   IR EMBO ART  VEN HEMORR  LYMPH EXTRAV  INC GUIDE ROADMAPPING  08/01/2018   IR IMAGING GUIDED PORT INSERTION  08/20/2018   IR PERC PLEURAL DRAIN W/INDWELL CATH W/IMG GUIDE  07/08/2018   IR THORACENTESIS ASP PLEURAL SPACE W/IMG GUIDE  07/07/2018   IR US GUIDE VASC ACCESS RIGHT  08/01/2018   PLEURAL EFFUSION DRAINAGE Left 07/13/2018   Procedure: DRAINAGE OF LOCULATED PLEURAL EFFUSION;  Surgeon: Kerin Perna, MD;  Location: [facility] OR;  Service: Thoracic;  Laterality: Left;   POLYPECTOMY  11/15/2020   Procedure: POLYPECTOMY;  Surgeon: Malissa Hippo, MD;  [facility];  Service: Endoscopy;;   REMOVAL OF PLEURAL DRAINAGE CATHETER Left 08/20/2018   Procedure: REMOVAL OF PLEURAL DRAINAGE CATHETER;  Surgeon: Kerin Perna, MD;  Location: [facility] OR;  Service: Thoracic;  Laterality: Left;   REMOVAL OF PLEURAL DRAINAGE CATHETER Left 08/20/2018   Procedure: REMOVAL OF PLEURAL DRAINAGE CATHETER;  Surgeon: Kerin Perna, MD;  Location: [facility] OR;  Service: Thoracic;  Laterality: Left;   TALC PLEURODESIS Left 07/13/2018   Procedure: Talc Pleuradesis;  Surgeon: Donata Clay, Theron Arista, MD;  Location: [facility] OR;  Service: Thoracic;  Laterality: Left;   TOTAL HIP ARTHROPLASTY Left 06/05/2020   Procedure: LEFT TOTAL HIP ARTHROPLASTY ANTERIOR APPROACH;  Surgeon: Tarry Kos, MD;  Location: [facility] OR;  Service: Orthopedics;  Laterality: Left;  3-C   TUBAL LIGATION Bilateral    UTERINE ABLATION     VIDEO ASSISTED THORACOSCOPY Left 07/13/2018   Procedure: VIDEO ASSISTED THORACOSCOPY;  Surgeon: Donata Clay,  Theron Arista, MD;  Location: [facility] OR;  Service: Thoracic;  Laterality: Left;    Social History: Social History   Socioeconomic History   Marital status: Widowed    Spouse name: Not on file   Number of children: 2   Years of education: 2-College   Highest education level: Not on file  Occupational History    Employer: [employer]  Tobacco Use   Smoking status: Every Day    Current packs/day: 0.50    Average packs/day: 0.5 packs/day for 17.1 years (8.5 ttl  pk-yrs)    Types: Cigarettes    Start date: 06/21/2001    Last attempt to quit: 06/22/2018   Smokeless tobacco: Never  Vaping Use   Vaping status: Never Used  Substance and Sexual Activity   Alcohol use: Not Currently    Alcohol/week: 1.0 standard drink of alcohol    Types: 1 Glasses of wine per week    Comment: occasionally   Drug use: No   Sexual activity: Not Currently  Other Topics Concern   Not on file  Social History Narrative   Patient lives at home with her daughter.    Patient has 2 children.    Patient is widowed.    Patient is right handed.    Patient has her Associates degree.      Social Drivers of Corporate investment banker Strain: Low Risk  (08/26/2022)   Received from [facility]   Overall Financial Resource Strain (CARDIA)    Difficulty of Paying Living Expenses: Not hard at all  Food Insecurity: No Food Insecurity (12/24/2022)   Hunger Vital Sign    Worried About Running Out of Food in the Last Year: Never true    Ran Out of Food in the Last Year: Never true  Transportation Needs: No Transportation Needs (12/24/2022)   PRAPARE - Administrator, Civil Service (Medical): No    Lack of Transportation (Non-Medical): No  Physical Activity: Inactive (07/22/2018)   Exercise Vital Sign    Days of Exercise per Week: 0 days    Minutes of Exercise per Session: 0 min  Stress: Stress Concern Present (07/22/2018)   [employer] of Occupational Health - Occupational Stress Questionnaire    Feeling of Stress : Very much  Social Connections: Moderately Isolated (07/22/2018)   Social Connection and Isolation Panel [NHANES]    Frequency of Communication with Friends and Family: More than three times a week    Frequency of Social Gatherings with Friends and Family: More than three times a week    Attends Religious Services: 1 to 4 times per year    Active Member of [employer] or Organizations: No    Attends Banker Meetings: Never     Marital Status: Widowed  Intimate Partner Violence: Not At Risk (12/24/2022)   Humiliation, Afraid, Rape, and Kick questionnaire    Fear of Current or Ex-Partner: No    Emotionally Abused: No    Physically Abused: No    Sexually Abused: No    Family History: Family History  Problem Relation Age of Onset   Depression Mother    Hypertension Mother    Obesity Mother    Diabetes Mother    Kidney disease Mother    Peripheral vascular disease Father    Atrial fibrillation Father    Crohn's disease Sister    Uterine cancer Sister 58       maternal half sister   COPD Brother    Osteoporosis  Brother    Breast cancer Maternal Aunt 50   Colon cancer Neg Hx     Current Medications:  Current Outpatient Medications:    albuterol (VENTOLIN HFA) 108 (90 Base) MCG/ACT inhaler, Inhale 2 puffs into the lungs every 6 (six) hours as needed for wheezing or shortness of breath., Disp: , Rfl:    Bioflavonoid Products (VITAMIN C) CHEW, Chew 1 tablet by mouth daily., Disp: , Rfl:    Cholecalciferol (VITAMIN D3) 50 MCG (2000 UT) TABS, Take 2,000 Units by mouth daily., Disp: , Rfl:    Cyanocobalamin (B-12) 3000 MCG CAPS, Take 3,000 mcg by mouth daily., Disp: , Rfl:    dicyclomine (BENTYL) 20 MG tablet, Take 1 tablet (20 mg total) by mouth every 6 (six) hours as needed for spasms., Disp: 120 tablet, Rfl: 1   docusate sodium (COLACE) 100 MG capsule, Take 200 mg by mouth in the morning and at bedtime., Disp: , Rfl:    furosemide (LASIX) 20 MG tablet, Take 1 tablet (20 mg total) by mouth daily as needed. (Patient taking differently: Take 20 mg by mouth in the morning.), Disp: 30 tablet, Rfl: 2   gabapentin (NEURONTIN) 100 MG capsule, Take 3 capsules (300 mg total) by mouth daily. TAKE THREE (3) CAPSULES BY MOUTH AT BEDTIME (Patient taking differently: Take 300 mg by mouth at bedtime.), Disp: 90 capsule, Rfl: 3   HYDROmorphone (DILAUDID) 8 MG tablet, Take 1 tablet (8 mg total) by mouth every 4 (four) hours as  needed (Breakthrough Pain)., Disp: 60 tablet, Rfl: 0   Lido-PE-Glycerin-Petrolatum (PREPARATION H RAPID RELIEF) 5-0.25-14.4-15 % CREA, Apply 1 application  topically every 6 (six) hours as needed (for hemorrhoids)., Disp: , Rfl:    magnesium oxide (MAG-OX) 400 (240 Mg) MG tablet, Take 1 tablet (400 mg total) by mouth 2 (two) times daily., Disp: 60 tablet, Rfl: 6   naloxegol oxalate (MOVANTIK) 25 MG TABS tablet, Take 1 tablet (25 mg total) by mouth daily., Disp: 30 tablet, Rfl: 0   naloxegol oxalate (MOVANTIK) 25 MG TABS tablet, Take 1 tablet (25 mg total) by mouth daily., Disp: 30 tablet, Rfl: 0   omeprazole (PRILOSEC) 40 MG capsule, TAKE ONE CAPSULE BY MOUTH TWICE DAILY BEFORE MEALS (Patient taking differently: Take 40 mg by mouth at bedtime.), Disp: 60 capsule, Rfl: 5   oxyCODONE ER 36 MG C12A, Take 1 capsule (36 mg total) by mouth 4 (four) times daily., Disp: 60 capsule, Rfl: 0   rosuvastatin (CRESTOR) 5 MG tablet, Take 5 mg by mouth at bedtime., Disp: , Rfl:    venlafaxine XR (EFFEXOR-XR) 150 MG 24 hr capsule, Take 150 mg by mouth at bedtime., Disp: , Rfl:    diazepam (VALIUM) 5 MG tablet, Take 1 tablet (5 mg total) by mouth at bedtime. (Patient not taking: Reported on 01/07/2023), Disp: 30 tablet, Rfl: 5   ondansetron (ZOFRAN-ODT) 4 MG disintegrating tablet, Take 1 tablet (4 mg total) by mouth every 8 (eight) hours as needed for nausea or vomiting. (Patient not taking: Reported on 01/07/2023), Disp: 20 tablet, Rfl: 2   prochlorperazine (COMPAZINE) 10 MG tablet, Take 10 mg by mouth 2 (two) times daily as needed for nausea or vomiting. (Patient not taking: Reported on 01/07/2023), Disp: , Rfl:  No current facility-administered medications for this visit.  Facility-Administered Medications Ordered in Other Visits:    0.9 %  sodium chloride infusion, , Intravenous, Continuous, Doreatha Massed, MD, Stopped at 10/23/20 1547   0.9 %  sodium chloride infusion, , Intravenous, Continuous,  Doreatha Massed, MD, Last Rate: 10 mL/hr at 01/07/23 0945, New Bag at 01/07/23 0945   0.9 %  sodium chloride infusion, , Intravenous, Continuous, Doreatha Massed, MD, Last Rate: 250 mL/hr at 01/07/23 0948, New Bag at 01/07/23 0948   CARBOplatin (PARAPLATIN) 470 mg in sodium chloride 0.9 % 250 mL chemo infusion, 470 mg, Intravenous, Once, Doreatha Massed, MD   gemcitabine (GEMZAR) 1,140 mg in sodium chloride 0.9 % 250 mL chemo infusion, 600 mg/m2 (Order-Specific), Intravenous, Once, Doreatha Massed, MD, Last Rate: 560 mL/hr at 01/07/23 1210, 1,140 mg at 01/07/23 1210   heparin lock flush 100 unit/mL, 500 Units, Intracatheter, Once PRN, Doreatha Massed, MD   sodium chloride flush (NS) 0.9 % injection 10 mL, 10 mL, Intravenous, PRN, Doreatha Massed, MD, 10 mL at 10/23/20 1544   sodium chloride flush (NS) 0.9 % injection 10 mL, 10 mL, Intracatheter, PRN, Doreatha Massed, MD   Allergies: Allergies  Allergen Reactions   Morphine And Codeine Itching   Nickel Itching   Nortriptyline Other (See Comments)    Significant weight gain   Topamax [Topiramate] Diarrhea and Nausea Only   Xanax [Alprazolam] Other (See Comments)    "Can't wake up"   Actifed Cold-Allergy [Chlorpheniramine-Phenyleph Er] Rash and Other (See Comments)    Red dye only   Amoxicillin Rash   Codeine Hives   Erythromycin Rash   Penicillins Rash        Red Dye Rash   Sudafed [Pseudoephedrine Hcl] Rash and Other (See Comments)    Red dye only    REVIEW OF SYSTEMS:   Review of Systems  Constitutional:  Negative for chills, fatigue and fever.  HENT:   Negative for lump/mass, mouth sores, nosebleeds, sore throat and trouble swallowing.   Eyes:  Negative for eye problems.  Respiratory:  Positive for shortness of breath and wheezing. Negative for cough.   Cardiovascular:  Negative for chest pain, leg swelling and palpitations.  Gastrointestinal:  Positive for abdominal pain (on the bilateral sides, 8/10  severity), constipation, nausea and vomiting. Negative for diarrhea.  Genitourinary:  Negative for bladder incontinence, difficulty urinating, dysuria, frequency, hematuria and nocturia.        +thick urine  Musculoskeletal:  Positive for back pain. Negative for arthralgias, flank pain, myalgias and neck pain.  Skin:  Negative for itching and rash.  Neurological:  Positive for dizziness and numbness (in hands and feet). Negative for headaches.  Hematological:  Does not bruise/bleed easily.  Psychiatric/Behavioral:  Positive for sleep disturbance. Negative for depression and suicidal ideas. The patient is not nervous/anxious.   All other systems reviewed and are negative.    VITALS:   There were no vitals taken for this visit.  Wt Readings from Last 3 Encounters:  01/07/23 201 lb 6.4 oz (91.4 kg)  12/24/22 197 lb (89.4 kg)  12/15/22 191 lb 5.8 oz (86.8 kg)    There is no height or weight on file to calculate BMI.  Performance status (ECOG): 1 - Symptomatic but completely ambulatory  PHYSICAL EXAM:   Physical Exam Vitals and nursing note reviewed. Exam conducted with a chaperone present.  Constitutional:      Appearance: Normal appearance.  Cardiovascular:     Rate and Rhythm: Normal rate and regular rhythm.     Pulses: Normal pulses.     Heart sounds: Normal heart sounds.  Pulmonary:     Effort: Pulmonary effort is normal.     Breath sounds: Normal breath sounds.  Abdominal:  General: There is distension.     Palpations: Abdomen is soft. There is no hepatomegaly, splenomegaly or mass.     Tenderness: There is no abdominal tenderness.  Musculoskeletal:     Right lower leg: No edema.     Left lower leg: No edema.  Lymphadenopathy:     Cervical: No cervical adenopathy.     Right cervical: No superficial, deep or posterior cervical adenopathy.    Left cervical: No superficial, deep or posterior cervical adenopathy.     Upper Body:     Right upper body: No supraclavicular  or axillary adenopathy.     Left upper body: No supraclavicular or axillary adenopathy.  Neurological:     General: No focal deficit present.     Mental Status: She is alert and oriented to person, place, and time.  Psychiatric:        Mood and Affect: Mood normal.        Behavior: Behavior normal.     LABS:      Latest Ref Rng & Units 01/07/2023    8:24 AM 12/30/2022    5:57 AM 12/26/2022   10:11 AM  CBC  WBC 4.0 - 10.5 K/uL 16.7  9.7  9.2   Hemoglobin 12.0 - 15.0 g/dL 16.1  09.6  04.5   Hematocrit 36.0 - 46.0 % 36.5  34.8  31.0   Platelets 150 - 400 K/uL 532  669  482       Latest Ref Rng & Units 01/07/2023    8:24 AM 12/30/2022    5:57 AM 12/27/2022    6:11 AM  CMP  Glucose 70 - 99 mg/dL 409  811  914   BUN 6 - 20 mg/dL 34  32  30   Creatinine 0.44 - 1.00 mg/dL 7.82  9.56  2.13   Sodium 135 - 145 mmol/L 127  133  129   Potassium 3.5 - 5.1 mmol/L 4.7  4.6  4.1   Chloride 98 - 111 mmol/L 89  96  92   CO2 22 - 32 mmol/L 25  27  26    Calcium 8.9 - 10.3 mg/dL 8.6  8.8  8.1   Total Protein 6.5 - 8.1 g/dL 6.7     Total Bilirubin <1.2 mg/dL 0.4     Alkaline Phos 38 - 126 U/L 468     AST 15 - 41 U/L 41     ALT 0 - 44 U/L 37        No results found for: "CEA1", "CEA" / No results found for: "CEA1", "CEA" No results found for: "PSA1" No results found for: "CAN199" Lab Results  Component Value Date   CAN125 151.0 (H) 11/12/2022    No results found for: "TOTALPROTELP", "ALBUMINELP", "A1GS", "A2GS", "BETS", "BETA2SER", "GAMS", "MSPIKE", "SPEI" Lab Results  Component Value Date   TIBC 288 11/12/2022   TIBC 329 08/05/2022   TIBC 346 05/27/2022   FERRITIN 206 11/12/2022   FERRITIN 219 08/05/2022   FERRITIN 67 05/27/2022   IRONPCTSAT 17 11/12/2022   IRONPCTSAT 29 08/05/2022   IRONPCTSAT 14 05/27/2022   Lab Results  Component Value Date   LDH 277 (H) 07/18/2018   LDH 193 (H) 07/07/2018     STUDIES:   US Paracentesis Result Date: 12/25/2022 INDICATION:  Patient with a history of ovarian cancer with recurrent ascites. Interventional radiology asked to perform a therapeutic paracentesis. EXAM: ULTRASOUND GUIDED PARACENTESIS MEDICATIONS: 1% lidocaine 10 mL COMPLICATIONS: None immediate. PROCEDURE: Informed written consent was  obtained from the patient after a discussion of the risks, benefits and alternatives to treatment. A timeout was performed prior to the initiation of the procedure. Initial ultrasound scanning demonstrates a large amount of ascites within the right lower abdominal quadrant. The right lower abdomen was prepped and draped in the usual sterile fashion. 1% lidocaine was used for local anesthesia. Following this, a 19 gauge, 7-cm, Yueh catheter was introduced. An ultrasound image was saved for documentation purposes. The paracentesis was performed. The catheter was removed and a dressing was applied. The patient tolerated the procedure well without immediate post procedural complication. FINDINGS: A total of approximately 2 L of clear yellow fluid was removed. IMPRESSION: Successful ultrasound-guided paracentesis yielding 2 liters of peritoneal fluid. Procedure performed by Alwyn Ren NP Electronically Signed   By: Gilmer Mor D.O.   On: 12/25/2022 12:20   CT ABDOMEN PELVIS W CONTRAST Result Date: 12/24/2022 CLINICAL DATA:  Abdominal pain for 2 weeks, stage IV ovarian cancer EXAM: CT ABDOMEN AND PELVIS WITH CONTRAST TECHNIQUE: Multidetector CT imaging of the abdomen and pelvis was performed using the standard protocol following bolus administration of intravenous contrast. RADIATION DOSE REDUCTION: This exam was performed according to the departmental dose-optimization program which includes automated exposure control, adjustment of the mA and/or kV according to patient size and/or use of iterative reconstruction technique. CONTRAST:  OMNIPAQUE IOHEXOL 300 MG/ML  SOLN COMPARISON:  12/15/2022 FINDINGS: Lower chest: No acute pleural or  parenchymal lung disease. Hepatobiliary: No focal liver abnormality is seen. Status post cholecystectomy. No biliary dilatation. Pancreas: Unremarkable. No pancreatic ductal dilatation or surrounding inflammatory changes. Spleen: Stable complex and partially calcified splenic cyst. Spleen is stable. Adrenals/Urinary Tract: Adrenal glands are unremarkable. Kidneys are normal, without renal calculi, focal lesion, or hydronephrosis. Bladder is decompressed. Stomach/Bowel: No bowel obstruction or ileus.  Stable hiatal hernia. Vascular/Lymphatic: Stable aortic atherosclerosis. No other significant vascular finding. No discrete adenopathy within the abdomen or pelvis. Reproductive: Prior hysterectomy.  No adnexal mass. Other: Marked progression of the peritoneal carcinomatosis seen previously, with diffuse mesenteric nodules and increasing complex ascites throughout the abdomen and pelvis. No free intraperitoneal gas. No abdominal wall hernia. Musculoskeletal: Left hip arthroplasty. No acute or destructive bony abnormalities. Reconstructed images demonstrate no additional findings. IMPRESSION: 1. Worsening diffuse peritoneal carcinomatosis, with large volume complex ascites throughout the abdomen and pelvis increased since prior study. Findings are consistent with progressive metastatic ovarian cancer. 2. No bowel obstruction or ileus.  Stable hiatal hernia. 3.  Aortic Atherosclerosis (ICD10-I70.0). Electronically Signed   By: Sharlet Salina M.D.   On: 12/24/2022 19:14   CT Angio Chest/Abd/Pel for Dissection W and/or Wo Contrast Result Date: 12/15/2022 CLINICAL DATA:  Upper abdominal pain for 1 week. History of ovarian cancer. EXAM: CT ANGIOGRAPHY CHEST, ABDOMEN AND PELVIS TECHNIQUE: Non-contrast CT of the chest was initially obtained. Multidetector CT imaging through the chest, abdomen and pelvis was performed using the standard protocol during bolus administration of intravenous contrast. Multiplanar reconstructed  images and MIPs were obtained and reviewed to evaluate the vascular anatomy. RADIATION DOSE REDUCTION: This exam was performed according to the departmental dose-optimization program which includes automated exposure control, adjustment of the mA and/or kV according to patient size and/or use of iterative reconstruction technique. CONTRAST:  OMNIPAQUE IOHEXOL 350 MG/ML SOLN COMPARISON:  November 22, 2022. FINDINGS: CTA CHEST FINDINGS Cardiovascular: Preferential opacification of the thoracic aorta. No evidence of thoracic aortic aneurysm or dissection. Normal heart size. No pericardial effusion. Mediastinum/Nodes: Small hiatal hernia. No adenopathy.  Thyroid gland is unremarkable. Lungs/Pleura: No pneumothorax or pleural effusion is noted. Minimal subsegmental atelectasis is noted posteriorly in right upper lobe. Left lung is clear. Musculoskeletal: No chest wall abnormality. No acute or significant osseous findings. Review of the MIP images confirms the above findings. CTA ABDOMEN AND PELVIS FINDINGS VASCULAR Aorta: Normal caliber aorta without aneurysm, dissection, vasculitis or significant stenosis. Celiac: Patent without evidence of aneurysm, dissection, vasculitis or significant stenosis. SMA: Patent without evidence of aneurysm, dissection, vasculitis or significant stenosis. Renals: Both renal arteries are patent without evidence of aneurysm, dissection, vasculitis, fibromuscular dysplasia or significant stenosis. IMA: Patent without evidence of aneurysm, dissection, vasculitis or significant stenosis. Inflow: Patent without evidence of aneurysm, dissection, vasculitis or significant stenosis. Veins: No obvious venous abnormality within the limitations of this arterial phase study. Review of the MIP images confirms the above findings. NON-VASCULAR Hepatobiliary: No focal liver abnormality is seen. Status post cholecystectomy. No biliary dilatation. Pancreas: Unremarkable. No pancreatic ductal dilatation  or surrounding inflammatory changes. Spleen: Stable appearance of cystic exophytic splenic lesion with internal calcifications. Adrenals/Urinary Tract: Adrenal glands are unremarkable. Kidneys are normal, without renal calculi, focal lesion, or hydronephrosis. Bladder is unremarkable. Stomach/Bowel: Stomach is unremarkable. There is no evidence of bowel obstruction or inflammation. Lymphatic: 11 mm left common iliac lymph node is noted concerning for metastatic disease. Reproductive: Status post hysterectomy. Other: Mild ascites is noted which is increased compared to prior exam. Increased peritoneal implants are noted compared to prior exam consistent with peritoneal carcinomatosis. Musculoskeletal: Status post left total hip arthroplasty. No acute osseous abnormality is noted. Review of the MIP images confirms the above findings. IMPRESSION: No evidence of thoracic or abdominal aortic dissection or aneurysm. Small hiatal hernia. Stable appearance of cystic exophytic splenic lesion with internal calcifications of unknown etiology. 11 mm left common iliac lymph node is noted concerning for metastatic disease. Mild ascites is noted which is increased compared to prior exam, with increased peritoneal implants noted diffusely consistent with peritoneal carcinomatosis. Electronically Signed   By: Lupita Raider M.D.   On: 12/15/2022 17:33

## 2023-01-07 NOTE — Progress Notes (Signed)
Orders received to decrease dose of gemcitabine to 600 mg/m2.  Maintain dose of bevacizumab today at 1200 mg despite weight change >10%  Plan updated.  V.O. Dr Carilyn Goodpasture, PharmD

## 2023-01-07 NOTE — Progress Notes (Signed)
Pharmacist Chemotherapy Monitoring - Initial Assessment    Anticipated start date: 01/07/23   The following has been reviewed per standard work regarding the patient's treatment regimen: The patient's diagnosis, treatment plan and drug doses, and organ/hematologic function Lab orders and baseline tests specific to treatment regimen  The treatment plan start date, drug sequencing, and pre-medications Prior authorization status  Patient's documented medication list, including drug-drug interaction screen and prescriptions for anti-emetics and supportive care specific to the treatment regimen The drug concentrations, fluid compatibility, administration routes, and timing of the medications to be used The patient's access for treatment and lifetime cumulative dose history, if applicable  The patient's medication allergies and previous infusion related reactions, if applicable   Changes made to treatment plan:  pre-medications carboplatin dose >7 - adding famotidine and cetirizine to future doses.  Follow up needed:  N/A   Stephens Shire, [facility], 01/07/2023  9:49 AM

## 2023-01-07 NOTE — Patient Instructions (Signed)

## 2023-01-08 ENCOUNTER — Other Ambulatory Visit: Payer: Self-pay

## 2023-01-09 ENCOUNTER — Telehealth: Payer: Self-pay

## 2023-01-09 ENCOUNTER — Inpatient Hospital Stay: Payer: 59 | Admitting: Hematology

## 2023-01-09 ENCOUNTER — Inpatient Hospital Stay: Payer: 59

## 2023-01-09 DIAGNOSIS — Z95828 Presence of other vascular implants and grafts: Secondary | ICD-10-CM

## 2023-01-09 DIAGNOSIS — C563 Malignant neoplasm of bilateral ovaries: Secondary | ICD-10-CM

## 2023-01-09 NOTE — Telephone Encounter (Signed)
Chemotherapy 24 hour follow up.  No answer.  Message left to call the Cancer Center for any problems.

## 2023-01-10 ENCOUNTER — Ambulatory Visit (HOSPITAL_COMMUNITY): Admission: RE | Admit: 2023-01-10 | Payer: 59 | Source: Ambulatory Visit

## 2023-01-10 ENCOUNTER — Other Ambulatory Visit: Payer: Self-pay | Admitting: Hematology

## 2023-01-13 ENCOUNTER — Other Ambulatory Visit: Payer: Self-pay | Admitting: *Deleted

## 2023-01-13 ENCOUNTER — Encounter (HOSPITAL_COMMUNITY): Payer: Self-pay | Admitting: Hematology

## 2023-01-13 ENCOUNTER — Telehealth: Payer: Self-pay | Admitting: *Deleted

## 2023-01-13 ENCOUNTER — Encounter: Payer: Self-pay | Admitting: Hematology

## 2023-01-13 MED ORDER — HYDROMORPHONE HCL 8 MG PO TABS: 8.0000 mg | ORAL_TABLET | ORAL | 0 refills | Status: DC | PRN

## 2023-01-13 NOTE — Telephone Encounter (Signed)
Patient had missed last paracentesis due to not feeling well.  Called to see if she could get in today, however first available appointment is not until Friday, which is her next scheduled appointment.  Advised niece, Morrie Sheldon to take her to the ER if she develops any distress.  Verbalized understanding.

## 2023-01-14 ENCOUNTER — Inpatient Hospital Stay (HOSPITAL_BASED_OUTPATIENT_CLINIC_OR_DEPARTMENT_OTHER): Payer: 59 | Admitting: Hematology

## 2023-01-14 ENCOUNTER — Inpatient Hospital Stay: Payer: 59

## 2023-01-14 ENCOUNTER — Emergency Department (HOSPITAL_COMMUNITY): Payer: 59

## 2023-01-14 ENCOUNTER — Observation Stay (HOSPITAL_COMMUNITY)
Admission: EM | Admit: 2023-01-14 | Discharge: 2023-01-15 | Disposition: A | Discharge status: Home / Self Care | Payer: 59 | Source: Home / Self Care

## 2023-01-14 ENCOUNTER — Encounter (HOSPITAL_COMMUNITY): Payer: Self-pay

## 2023-01-14 DIAGNOSIS — Z79899 Other long term (current) drug therapy: Secondary | ICD-10-CM | POA: Insufficient documentation

## 2023-01-14 DIAGNOSIS — Z96642 Presence of left artificial hip joint: Secondary | ICD-10-CM | POA: Insufficient documentation

## 2023-01-14 DIAGNOSIS — G893 Neoplasm related pain (acute) (chronic): Secondary | ICD-10-CM | POA: Diagnosis present

## 2023-01-14 DIAGNOSIS — K219 Gastro-esophageal reflux disease without esophagitis: Secondary | ICD-10-CM | POA: Diagnosis present

## 2023-01-14 DIAGNOSIS — Z5112 Encounter for antineoplastic immunotherapy: Secondary | ICD-10-CM | POA: Diagnosis not present

## 2023-01-14 DIAGNOSIS — Z515 Encounter for palliative care: Secondary | ICD-10-CM

## 2023-01-14 DIAGNOSIS — Z5111 Encounter for antineoplastic chemotherapy: Secondary | ICD-10-CM | POA: Diagnosis not present

## 2023-01-14 DIAGNOSIS — R180 Malignant ascites: Secondary | ICD-10-CM | POA: Insufficient documentation

## 2023-01-14 DIAGNOSIS — R188 Other ascites: Secondary | ICD-10-CM | POA: Diagnosis not present

## 2023-01-14 DIAGNOSIS — F17210 Nicotine dependence, cigarettes, uncomplicated: Secondary | ICD-10-CM | POA: Insufficient documentation

## 2023-01-14 DIAGNOSIS — Z95828 Presence of other vascular implants and grafts: Secondary | ICD-10-CM

## 2023-01-14 DIAGNOSIS — C563 Malignant neoplasm of bilateral ovaries: Secondary | ICD-10-CM | POA: Insufficient documentation

## 2023-01-14 DIAGNOSIS — R1012 Left upper quadrant pain: Secondary | ICD-10-CM | POA: Diagnosis not present

## 2023-01-14 DIAGNOSIS — Z90710 Acquired absence of both cervix and uterus: Secondary | ICD-10-CM | POA: Diagnosis not present

## 2023-01-14 DIAGNOSIS — C786 Secondary malignant neoplasm of retroperitoneum and peritoneum: Secondary | ICD-10-CM | POA: Diagnosis not present

## 2023-01-14 LAB — COMPREHENSIVE METABOLIC PANEL
ALT: 158 U/L — ABNORMAL HIGH (ref 0–44)
ALT: 146 U/L — ABNORMAL HIGH (ref 0–44)
AST: 238 U/L — ABNORMAL HIGH (ref 15–41)
AST: 212 U/L — ABNORMAL HIGH (ref 15–41)
Albumin: 2.2 g/dL — ABNORMAL LOW (ref 3.5–5.0)
Albumin: 2 g/dL — ABNORMAL LOW (ref 3.5–5.0)
Alkaline Phosphatase: 1505 U/L — ABNORMAL HIGH (ref 38–126)
Alkaline Phosphatase: 1367 U/L — ABNORMAL HIGH (ref 38–126)
Anion gap: 12 (ref 5–15)
Anion gap: 9 (ref 5–15)
BUN: 20 mg/dL (ref 6–20)
BUN: 19 mg/dL (ref 6–20)
CO2: 24 mmol/L (ref 22–32)
CO2: 27 mmol/L (ref 22–32)
Calcium: 8.9 mg/dL (ref 8.9–10.3)
Calcium: 8.5 mg/dL — ABNORMAL LOW (ref 8.9–10.3)
Chloride: 92 mmol/L — ABNORMAL LOW (ref 98–111)
Chloride: 91 mmol/L — ABNORMAL LOW (ref 98–111)
Creatinine, Ser: 0.94 mg/dL (ref 0.44–1.00)
Creatinine, Ser: 0.79 mg/dL (ref 0.44–1.00)
GFR, Estimated: >60 mL/min (ref >60)
GFR, Estimated: >60 mL/min (ref >60)
Glucose, Bld: 131 mg/dL — ABNORMAL HIGH (ref 70–99)
Glucose, Bld: 130 mg/dL — ABNORMAL HIGH (ref 70–99)
Potassium: 4.1 mmol/L (ref 3.5–5.1)
Potassium: 4 mmol/L (ref 3.5–5.1)
Sodium: 128 mmol/L — ABNORMAL LOW (ref 135–145)
Sodium: 127 mmol/L — ABNORMAL LOW (ref 135–145)
Total Bilirubin: 0.7 mg/dL (ref 0.0–1.2)
Total Bilirubin: 0.7 mg/dL (ref 0.0–1.2)
Total Protein: 7.3 g/dL (ref 6.5–8.1)
Total Protein: 7 g/dL (ref 6.5–8.1)

## 2023-01-14 LAB — CBC WITH DIFFERENTIAL/PLATELET
Abs Immature Granulocytes: 0 10*3/uL (ref 0.00–0.07)
Abs Immature Granulocytes: 0.02 10*3/uL (ref 0.00–0.07)
Basophils Absolute: 0 10*3/uL (ref 0.0–0.1)
Basophils Absolute: 0 10*3/uL (ref 0.0–0.1)
Basophils Relative: 0 %
Basophils Relative: 0 %
Eosinophils Absolute: 0 10*3/uL (ref 0.0–0.5)
Eosinophils Absolute: 0 10*3/uL (ref 0.0–0.5)
Eosinophils Relative: 0 %
Eosinophils Relative: 0 %
HCT: 34.2 % — ABNORMAL LOW (ref 36.0–46.0)
HCT: 33.9 % — ABNORMAL LOW (ref 36.0–46.0)
Hemoglobin: 11.3 g/dL — ABNORMAL LOW (ref 12.0–15.0)
Hemoglobin: 10.8 g/dL — ABNORMAL LOW (ref 12.0–15.0)
Immature Granulocytes: 1 %
Lymphocytes Relative: 20 %
Lymphocytes Relative: 17 %
Lymphs Abs: 0.7 10*3/uL (ref 0.7–4.0)
Lymphs Abs: 0.6 10*3/uL — ABNORMAL LOW (ref 0.7–4.0)
MCH: 34.6 pg — ABNORMAL HIGH (ref 26.0–34.0)
MCH: 33.8 pg (ref 26.0–34.0)
MCHC: 33 g/dL (ref 30.0–36.0)
MCHC: 31.9 g/dL (ref 30.0–36.0)
MCV: 104.6 fL — ABNORMAL HIGH (ref 80.0–100.0)
MCV: 105.9 fL — ABNORMAL HIGH (ref 80.0–100.0)
Monocytes Absolute: 0.2 10*3/uL (ref 0.1–1.0)
Monocytes Absolute: 0.1 10*3/uL (ref 0.1–1.0)
Monocytes Relative: 5 %
Monocytes Relative: 4 %
Neutro Abs: 2.5 10*3/uL (ref 1.7–7.7)
Neutro Abs: 2.5 10*3/uL (ref 1.7–7.7)
Neutrophils Relative %: 75 %
Neutrophils Relative %: 78 %
Platelets: 119 10*3/uL — ABNORMAL LOW (ref 150–400)
Platelets: 122 10*3/uL — ABNORMAL LOW (ref 150–400)
RBC: 3.27 MIL/uL — ABNORMAL LOW (ref 3.87–5.11)
RBC: 3.2 MIL/uL — ABNORMAL LOW (ref 3.87–5.11)
RDW: 16.6 % — ABNORMAL HIGH (ref 11.5–15.5)
RDW: 16.7 % — ABNORMAL HIGH (ref 11.5–15.5)
WBC: 3.3 10*3/uL — ABNORMAL LOW (ref 4.0–10.5)
WBC: 3.2 10*3/uL — ABNORMAL LOW (ref 4.0–10.5)
nRBC: 0 % (ref 0.0–0.2)
nRBC: 0 % (ref 0.0–0.2)

## 2023-01-14 LAB — MAGNESIUM: Magnesium: 1.7 mg/dL (ref 1.7–2.4)

## 2023-01-14 MED ORDER — ONDANSETRON 4 MG PO TBDP
4.0000 mg | ORAL_TABLET | Freq: Three times a day (TID) | ORAL | Status: DC | PRN
  Administered 2023-01-15 (×2): 4 mg via ORAL
  Filled 2023-01-14 (×2): qty 1

## 2023-01-14 MED ORDER — GABAPENTIN 300 MG PO CAPS
300.0000 mg | ORAL_CAPSULE | Freq: Every day | ORAL | Status: DC
  Administered 2023-01-14: 300 mg via ORAL
  Filled 2023-01-14: qty 1

## 2023-01-14 MED ORDER — SODIUM CHLORIDE 0.9 % IV SOLN: 250.0000 mL | INTRAVENOUS | Status: DC | PRN

## 2023-01-14 MED ORDER — HYDROMORPHONE HCL 1 MG/ML IJ SOLN
1.0000 mg | Freq: Once | INTRAMUSCULAR | Status: AC
Start: 2023-01-14 — End: 2023-01-14
  Administered 2023-01-14: 1 mg via INTRAVENOUS
  Filled 2023-01-14: qty 1

## 2023-01-14 MED ORDER — OXYCODONE HCL ER 15 MG PO T12A: 40.0000 mg | EXTENDED_RELEASE_TABLET | Freq: Two times a day (BID) | ORAL | Status: DC

## 2023-01-14 MED ORDER — HYDROMORPHONE HCL 1 MG/ML IJ SOLN
2.0000 mg | INTRAMUSCULAR | Status: DC | PRN
  Administered 2023-01-15 (×2): 2 mg via INTRAVENOUS
  Filled 2023-01-14 (×2): qty 2

## 2023-01-14 MED ORDER — ALBUTEROL SULFATE (2.5 MG/3ML) 0.083% IN NEBU: 3.0000 mL | INHALATION_SOLUTION | Freq: Four times a day (QID) | RESPIRATORY_TRACT | Status: DC | PRN

## 2023-01-14 MED ORDER — ALTEPLASE 2 MG IJ SOLR
2.0000 mg | Freq: Once | INTRAMUSCULAR | Status: AC
Start: 2023-01-14 — End: 2023-01-14
  Administered 2023-01-14: 2 mg
  Filled 2023-01-14: qty 2

## 2023-01-14 MED ORDER — VENLAFAXINE HCL ER 37.5 MG PO CP24: 150.0000 mg | ORAL_CAPSULE | Freq: Every day | ORAL | Status: DC

## 2023-01-14 MED ORDER — SODIUM CHLORIDE FLUSH 0.9 % IV SOLN
10.0000 mL | Freq: Once | INTRAVENOUS | Status: AC
Start: 2023-01-14 — End: 2023-01-14
  Administered 2023-01-14: 10 mL via INTRAVENOUS
  Filled 2023-01-14: qty 10

## 2023-01-14 MED ORDER — DICYCLOMINE HCL 10 MG/ML IM SOLN
20.0000 mg | Freq: Once | INTRAMUSCULAR | Status: AC
  Administered 2023-01-14: 20 mg via INTRAMUSCULAR
  Filled 2023-01-14: qty 2

## 2023-01-14 MED ORDER — DOCUSATE SODIUM 100 MG PO CAPS
200.0000 mg | ORAL_CAPSULE | ORAL | Status: DC
  Administered 2023-01-14 – 2023-01-15 (×2): 200 mg via ORAL
  Filled 2023-01-14 (×2): qty 2

## 2023-01-14 MED ORDER — HYDROMORPHONE HCL 1 MG/ML IJ SOLN
1.0000 mg | Freq: Once | INTRAMUSCULAR | Status: AC
  Administered 2023-01-14: 1 mg via INTRAVENOUS
  Filled 2023-01-14: qty 1

## 2023-01-14 MED ORDER — DICYCLOMINE HCL 20 MG PO TABS: 20.0000 mg | ORAL_TABLET | Freq: Four times a day (QID) | ORAL | Status: DC | PRN

## 2023-01-14 MED ORDER — DIAZEPAM 5 MG PO TABS
5.0000 mg | ORAL_TABLET | Freq: Every day | ORAL | Status: DC
  Administered 2023-01-14: 5 mg via ORAL
  Filled 2023-01-14: qty 1

## 2023-01-14 MED ORDER — PHENYLEPHRINE-MINERAL OIL-PET 0.25-14-74.9 % RE OINT: 1.0000 | TOPICAL_OINTMENT | Freq: Two times a day (BID) | RECTAL | Status: DC | PRN

## 2023-01-14 MED ORDER — VITAMIN D 25 MCG (1000 UNIT) PO TABS: 2000.0000 [IU] | ORAL_TABLET | Freq: Every day | ORAL | Status: DC

## 2023-01-14 MED ORDER — SODIUM CHLORIDE 0.9% FLUSH
3.0000 mL | Freq: Two times a day (BID) | INTRAVENOUS | Status: DC
  Administered 2023-01-14 – 2023-01-15 (×2): 3 mL via INTRAVENOUS

## 2023-01-14 MED ORDER — MAGNESIUM OXIDE -MG SUPPLEMENT 400 (240 MG) MG PO TABS: 400.0000 mg | ORAL_TABLET | Freq: Two times a day (BID) | ORAL | Status: DC

## 2023-01-14 MED ORDER — ROSUVASTATIN CALCIUM 5 MG PO TABS: 5.0000 mg | ORAL_TABLET | Freq: Every day | ORAL | Status: DC

## 2023-01-14 MED ORDER — HYDROMORPHONE HCL 2 MG PO TABS
8.0000 mg | ORAL_TABLET | ORAL | Status: DC | PRN
  Administered 2023-01-14 – 2023-01-15 (×2): 8 mg via ORAL
  Filled 2023-01-14 (×2): qty 4

## 2023-01-14 MED ORDER — ONDANSETRON HCL 4 MG/2ML IJ SOLN
4.0000 mg | Freq: Once | INTRAMUSCULAR | Status: AC
Start: 2023-01-14 — End: 2023-01-14
  Administered 2023-01-14: 4 mg via INTRAVENOUS
  Filled 2023-01-14: qty 2

## 2023-01-14 MED ORDER — FUROSEMIDE 40 MG PO TABS
20.0000 mg | ORAL_TABLET | Freq: Every morning | ORAL | Status: DC
  Administered 2023-01-15: 20 mg via ORAL
  Filled 2023-01-14: qty 1

## 2023-01-14 MED ORDER — DICYCLOMINE HCL 10 MG PO CAPS
20.0000 mg | ORAL_CAPSULE | Freq: Four times a day (QID) | ORAL | Status: DC | PRN
  Administered 2023-01-14 – 2023-01-15 (×3): 20 mg via ORAL
  Filled 2023-01-14 (×3): qty 2

## 2023-01-14 MED ORDER — ONDANSETRON HCL 4 MG PO TABS: 4.0000 mg | ORAL_TABLET | Freq: Three times a day (TID) | ORAL | Status: DC | PRN

## 2023-01-14 MED ORDER — SODIUM CHLORIDE 0.9% FLUSH: 3.0000 mL | INTRAVENOUS | Status: DC | PRN

## 2023-01-14 MED ORDER — HYDROMORPHONE HCL 1 MG/ML IJ SOLN
2.0000 mg | Freq: Once | INTRAMUSCULAR | Status: AC
  Administered 2023-01-14: 2 mg via INTRAVENOUS
  Filled 2023-01-14: qty 2

## 2023-01-14 MED ORDER — OXYCODONE HCL ER 15 MG PO T12A
40.0000 mg | EXTENDED_RELEASE_TABLET | Freq: Three times a day (TID) | ORAL | Status: DC
  Administered 2023-01-14 – 2023-01-15 (×2): 40 mg via ORAL
  Filled 2023-01-14 (×2): qty 1

## 2023-01-14 MED ORDER — NALOXEGOL OXALATE 25 MG PO TABS
25.0000 mg | ORAL_TABLET | Freq: Every day | ORAL | Status: DC
Start: 2023-01-15 — End: 2023-01-15
  Administered 2023-01-15: 25 mg via ORAL
  Filled 2023-01-14 (×2): qty 1

## 2023-01-14 MED ORDER — PANTOPRAZOLE SODIUM 40 MG PO TBEC
80.0000 mg | DELAYED_RELEASE_TABLET | Freq: Every day | ORAL | Status: DC
  Administered 2023-01-14 – 2023-01-15 (×2): 80 mg via ORAL
  Filled 2023-01-14 (×2): qty 2

## 2023-01-14 MED ORDER — PREPARATION H RAPID RELIEF 5-0.25-14.4-15 % EX CREA: 1.0000 | TOPICAL_CREAM | Freq: Four times a day (QID) | CUTANEOUS | Status: DC | PRN

## 2023-01-14 MED ORDER — HEPARIN SODIUM (PORCINE) 5000 UNIT/ML IJ SOLN
5000.0000 [IU] | Freq: Three times a day (TID) | INTRAMUSCULAR | Status: DC
  Filled 2023-01-14: qty 1

## 2023-01-14 NOTE — Assessment & Plan Note (Signed)
 Patient denies active GERD  Plan Continue PPI

## 2023-01-14 NOTE — Assessment & Plan Note (Signed)
 Patient has seen palliative care in the past. She is aware of her prognosis.  Plan TOC consult re: in home care/hospice

## 2023-01-14 NOTE — Assessment & Plan Note (Signed)
 Patient with stage 4 serous ovarian cancer with mets now with massive ascites compromising respirations. Paracentesis cannot be performed at [facility] until Friday. Patient will be transferred to [facility] for paracentesis by IR.  Plan Transfer patient to [facility]  IR at [facility] notified - order is in for paracentesis.  DVT prophylaxis with SQ heparin  - no dosing after midnight

## 2023-01-14 NOTE — ED Triage Notes (Addendum)
Pt sent from cancer center for shortness of breath and fluid on her abdomen, pt states she has stage 4 ovarian cancer and was sent to ED by Dr. Kirtland Bouchard to have the fluid removed. Pt arrives with PAC to right chest accessed but states it was not flushing

## 2023-01-14 NOTE — H&P (Signed)
 History and Physical    Michelle Holt FMW:[id number] DOB: 07/27/1970 DOA: 01/14/2023  DOS: the patient was seen and examined on 01/14/2023  PCP: Rosamond Leta NOVAK, MD   Patient coming from: Clinic  I have personally briefly reviewed patient's old medical records in [facility]  Michelle Holt, a 52 y/o with multiple chronic medical problems has stage 4 serous ovarian cancer with peritoneal and pleural implants who was at the Cancer center for treatment. She was noted to have massive ascites that were causing pain and compromising respirations. She was referred by Dr. MARLA to [facility] for paracentesis.    ED Course: T 97.6  121/79  HR 96  RR 18. Patient seems relatively comfortable. She is aware of her diagnosis and prognosis. Lab: Na 127, Glucose 130, Alk phos 1,367  Alb 2.0, AST 212, ALT 146, CBC with Hgb 10,8, WBC 3.2. Abd U/S - ascites all 4 quadrants. TRH called to admit to [facility] for paracentensis 01/15/23  Review of Systems:  Review of Systems  Constitutional:  Positive for weight loss. Negative for chills and fever.  HENT: Negative.    Eyes: Negative.   Respiratory:  Positive for shortness of breath.   Cardiovascular:  Negative for chest pain, palpitations and leg swelling.  Gastrointestinal:  Positive for abdominal pain and heartburn.  Genitourinary: Negative.   Musculoskeletal:  Positive for back pain.  Skin: Negative.   Neurological: Negative.   Endo/Heme/Allergies:  Negative for polydipsia. Does not bruise/bleed easily.  Psychiatric/Behavioral: Negative.      Past Medical History:  Diagnosis Date   Anemia    Anxiety and depression    Arthritis of facet joints at multiple vertebral levels    L5-S1   Constipation    Dyslipidemia    Family history of breast cancer    Family history of uterine cancer    GERD (gastroesophageal reflux disease)    History of hiatal hernia    History of kidney stones    Insomnia    Irritable bowel syndrome    Migraine    Muscle tension  headache    Neuropathy of finger    Ovarian carcinoma (HCC)    ovarian   Plantar fasciitis of right foot    Port-A-Cath in place 08/20/2018    Past Surgical History:  Procedure Laterality Date   ABDOMINAL HYSTERECTOMY     BIOPSY  10/27/2020   Procedure: BIOPSY;  Surgeon: Eartha Angelia Sieving, MD;  [facility];  Service: Gastroenterology;;   CHOLECYSTECTOMY  2008   COLONOSCOPY N/A 08/13/2013   Procedure: COLONOSCOPY;  Surgeon: Claudis RAYMOND Rivet, MD;  [facility];  Service: Endoscopy;  Laterality: N/A;  230-moved to 145 Ann to notify pt   COLONOSCOPY WITH PROPOFOL  N/A 11/15/2020   Procedure: COLONOSCOPY WITH PROPOFOL ;  Surgeon: Rivet Claudis RAYMOND, MD;  [facility];  Service: Endoscopy;  Laterality: N/A;  1:40   ESOPHAGEAL DILATION N/A 02/28/2021   Procedure: ESOPHAGEAL DILATION;  Surgeon: Rivet Claudis RAYMOND, MD;  [facility];  Service: Endoscopy;  Laterality: N/A;   ESOPHAGOGASTRODUODENOSCOPY     ESOPHAGOGASTRODUODENOSCOPY (EGD) WITH PROPOFOL  N/A 07/20/2018   Procedure: ESOPHAGOGASTRODUODENOSCOPY (EGD) WITH PROPOFOL ;  Surgeon: Rivet Claudis RAYMOND, MD;  [facility];  Service: Endoscopy;  Laterality: N/A;  Possible esophageal dilation.   ESOPHAGOGASTRODUODENOSCOPY (EGD) WITH PROPOFOL  N/A 10/27/2020   Procedure: ESOPHAGOGASTRODUODENOSCOPY (EGD) WITH PROPOFOL ;  Surgeon: Eartha Angelia Sieving, MD;  [facility];  Service: Gastroenterology;  Laterality: N/A;  2:10, pt knows to arrive at 10:15   ESOPHAGOGASTRODUODENOSCOPY (EGD) WITH PROPOFOL  N/A 02/28/2021   Procedure: ESOPHAGOGASTRODUODENOSCOPY (EGD) WITH PROPOFOL ;  Surgeon: Golda Claudis PENNER, MD;  [facility];  Service: Endoscopy;  Laterality: N/A;  200 ASA 1   ESOPHAGOGASTRODUODENOSCOPY (EGD) WITH PROPOFOL  N/A 03/29/2022   Procedure: ESOPHAGOGASTRODUODENOSCOPY (EGD) WITH PROPOFOL ;  Surgeon: Eartha Angelia Sieving, MD;  [facility];  Service:  Gastroenterology;  Laterality: N/A;  2:00 pm, asa 1-2   GIVENS CAPSULE STUDY N/A 11/24/2020   Procedure: GIVENS CAPSULE STUDY;  Surgeon: Golda Claudis PENNER, MD;  [facility];  Service: Endoscopy;  Laterality: N/A;  7:30   IR ANGIOGRAM SELECTIVE EACH ADDITIONAL VESSEL  08/01/2018   IR ANGIOGRAM VISCERAL SELECTIVE  08/01/2018   IR EMBO ART  VEN HEMORR LYMPH EXTRAV  INC GUIDE ROADMAPPING  08/01/2018   IR IMAGING GUIDED PORT INSERTION  08/20/2018   IR PERC PLEURAL DRAIN W/INDWELL CATH W/IMG GUIDE  07/08/2018   IR THORACENTESIS ASP PLEURAL SPACE W/IMG GUIDE  07/07/2018   IR US  GUIDE VASC ACCESS RIGHT  08/01/2018   PLEURAL EFFUSION DRAINAGE Left 07/13/2018   Procedure: DRAINAGE OF LOCULATED PLEURAL EFFUSION;  Surgeon: Fleeta Hanford Coy, MD;  Location:  [facility] OR;  Service: Thoracic;  Laterality: Left;   POLYPECTOMY  11/15/2020   Procedure: POLYPECTOMY;  Surgeon: Golda Claudis PENNER, MD;  [facility];  Service: Endoscopy;;   REMOVAL OF PLEURAL DRAINAGE CATHETER Left 08/20/2018   Procedure: REMOVAL OF PLEURAL DRAINAGE CATHETER;  Surgeon: Fleeta Hanford Coy, MD;  Location: [facility] OR;  Service: Thoracic;  Laterality: Left;   REMOVAL OF PLEURAL DRAINAGE CATHETER Left 08/20/2018   Procedure: REMOVAL OF PLEURAL DRAINAGE CATHETER;  Surgeon: Fleeta Hanford Coy, MD;  Location: [facility] OR;  Service: Thoracic;  Laterality: Left;   TALC  PLEURODESIS Left 07/13/2018   Procedure: Talc  Pleuradesis;  Surgeon: Fleeta Hanford, Coy, MD;  Location: [facility] OR;  Service: Thoracic;  Laterality: Left;   TOTAL HIP ARTHROPLASTY Left 06/05/2020   Procedure: LEFT TOTAL HIP ARTHROPLASTY ANTERIOR APPROACH;  Surgeon: Jerri Kay HERO, MD;  Location: [facility] OR;  Service: Orthopedics;  Laterality: Left;  3-C   TUBAL LIGATION Bilateral    UTERINE ABLATION     VIDEO ASSISTED THORACOSCOPY Left 07/13/2018   Procedure: VIDEO ASSISTED THORACOSCOPY;  Surgeon: Fleeta Hanford Coy, MD;  Location: [facility] OR;  Service: Thoracic;  Laterality: Left;    Soc Hx: Marriage # 1 - 13  years, 2 children; Marriage # 2 18 years - widowed in 2018. Work - worked for [employer]. Lives independently.    reports that she has been smoking cigarettes. She started smoking about 21 years ago. She has a 8.5 pack-year smoking history. She has never used smokeless tobacco. She reports that she does not currently use alcohol after a past usage of about 1.0 standard drink of alcohol per week. She reports that she does not use drugs.  Allergies  Allergen Reactions   Morphine  And Codeine Itching   Nickel Itching   Nortriptyline  Other (See Comments)    Significant weight gain   Topamax  [Topiramate ] Diarrhea and Nausea Only   Xanax  [Alprazolam ] Other (See Comments)    Can't wake up   Actifed Cold-Allergy [Chlorpheniramine-Phenyleph Er] Rash and Other (See Comments)    Red dye only   Amoxicillin Rash   Codeine Hives   Erythromycin Rash   Penicillins Rash        Red Dye Rash   Sudafed [Pseudoephedrine Hcl] Rash and  Other (See Comments)    Red dye only    Family History  Problem Relation Age of Onset   Depression Mother    Hypertension Mother    Obesity Mother    Diabetes Mother    Kidney disease Mother    Peripheral vascular disease Father    Atrial fibrillation Father    Crohn's disease Sister    Uterine cancer Sister 15       maternal half sister   COPD Brother    Osteoporosis Brother    Breast cancer Maternal Aunt 61   Colon cancer Neg Hx     Prior to Admission medications   Medication Sig Start Date End Date Taking? Authorizing Provider  albuterol  (VENTOLIN  HFA) 108 (90 Base) MCG/ACT inhaler Inhale 2 puffs into the lungs every 6 (six) hours as needed for wheezing or shortness of breath.    [provider]  Bioflavonoid Products (VITAMIN C) CHEW Chew 1 tablet by mouth daily.    [provider]  Cholecalciferol  (VITAMIN D3) 50 MCG (2000 UT) TABS Take 2,000 Units by mouth daily.    [provider]  Cyanocobalamin (B-12) 3000 MCG CAPS  Take 3,000 mcg by mouth daily.    [provider]  diazepam  (VALIUM ) 5 MG tablet Take 1 tablet (5 mg total) by mouth at bedtime. 09/05/22   Rogers Hai, MD  dicyclomine  (BENTYL ) 20 MG tablet Take 1 tablet (20 mg total) by mouth every 6 (six) hours as needed for spasms. 01/07/23   Rogers Hai, MD  docusate sodium  (COLACE) 100 MG capsule Take 200 mg by mouth in the morning and at bedtime.    [provider]  furosemide  (LASIX ) 20 MG tablet Take 1 tablet (20 mg total) by mouth daily as needed. Patient taking differently: Take 20 mg by mouth in the morning. 12/02/22   Rogers Hai, MD  gabapentin  (NEURONTIN ) 100 MG capsule TAKE THREE CAPSULES BY MOUTH DAILY AT BEDTIME 01/13/23   Rogers Hai, MD  HYDROmorphone  (DILAUDID ) 8 MG tablet Take 1 tablet (8 mg total) by mouth every 4 (four) hours as needed (Breakthrough Pain). 01/13/23   Rogers Hai, MD  Lido-PE-Glycerin-Petrolatum (PREPARATION H RAPID RELIEF) 5-0.25-14.4-15 % CREA Apply 1 application  topically every 6 (six) hours as needed (for hemorrhoids).    [provider]  magnesium  oxide (MAG-OX) 400 (240 Mg) MG tablet Take 1 tablet (400 mg total) by mouth 2 (two) times daily. 10/09/21   Lamon Pleasant HERO, PA-C  naloxegol  oxalate (MOVANTIK ) 25 MG TABS tablet Take 1 tablet (25 mg total) by mouth daily. 12/30/22   Marvine Almarie CROME, DO  naloxegol  oxalate (MOVANTIK ) 25 MG TABS tablet Take 1 tablet (25 mg total) by mouth daily. 12/30/22     omeprazole  (PRILOSEC) 40 MG capsule TAKE ONE CAPSULE BY MOUTH TWICE DAILY BEFORE MEALS Patient taking differently: Take 40 mg by mouth at bedtime. 04/29/22   Eartha Angelia Sieving, MD  ondansetron  (ZOFRAN ) 4 MG tablet Take 4 mg by mouth every 8 (eight) hours as needed. 01/12/23   [provider]  ondansetron  (ZOFRAN -ODT) 4 MG disintegrating tablet Take 1 tablet (4 mg total) by mouth every 8 (eight) hours as needed for nausea or vomiting.  01/01/23   Rogers Hai, MD  oxyCODONE  ER 36 MG C12A Take 1 capsule (36 mg total) by mouth 4 (four) times daily. 12/30/22   Drusilla Sabas RAMAN, MD  prochlorperazine  (COMPAZINE ) 10 MG tablet Take 10 mg by mouth 2 (two) times daily as needed for  nausea or vomiting.    [provider]  rosuvastatin  (CRESTOR ) 5 MG tablet Take 5 mg by mouth at bedtime. 10/05/22   [provider]  venlafaxine  XR (EFFEXOR -XR) 150 MG 24 hr capsule Take 150 mg by mouth at bedtime. 10/03/22   [provider]    Physical Exam: Vitals:   01/14/23 1300 01/14/23 1400 01/14/23 1430 01/14/23 1700  BP:  (!) 120/95 121/79 (!) 130/93  Pulse:  96 96 95  Resp:  17 18 12   Temp:      TempSrc:      SpO2:  95% 96% 96%  Weight: 90.7 kg     Height: 5' 4 (1.626 m)       Physical Exam Vitals and nursing note reviewed.  Constitutional:      General: She is not in acute distress.    Appearance: She is normal weight. She is ill-appearing. She is not toxic-appearing or diaphoretic.  HENT:     Head: Normocephalic and atraumatic.  Eyes:     Extraocular Movements: Extraocular movements intact.     Pupils: Pupils are equal, round, and reactive to light.  Neck:     Vascular: No hepatojugular reflux.  Cardiovascular:     Rate and Rhythm: Normal rate and regular rhythm.     Pulses: Normal pulses.     Heart sounds: Normal heart sounds.  Pulmonary:     Effort: Pulmonary effort is normal. No tachypnea, accessory muscle usage or respiratory distress.     Breath sounds: Normal breath sounds. No decreased breath sounds, wheezing, rhonchi or rales.  Chest:     Chest wall: No mass or edema.  Abdominal:     Comments: Markedly distended, very firm, no fluid wave - tense. Minimal tenderness  Musculoskeletal:        General: Normal range of motion.  Skin:    General: Skin is warm and dry.  Neurological:     General: No focal deficit present.     Mental Status: She is alert and oriented to person, place, and  time.  Psychiatric:        Mood and Affect: Mood normal.      Labs on Admission: I have personally reviewed following labs and imaging studies  CBC: Recent Labs  Lab 01/14/23 1058 01/14/23 1420  WBC 3.3* 3.2*  NEUTROABS 2.5 2.5  HGB 11.3* 10.8*  HCT 34.2* 33.9*  MCV 104.6* 105.9*  PLT 119* 122*   Basic Metabolic Panel: Recent Labs  Lab 01/14/23 1058 01/14/23 1420  NA 128* 127*  K 4.1 4.0  CL 92* 91*  CO2 24 27  GLUCOSE 131* 130*  BUN 20 19  CREATININE 0.94 0.79  CALCIUM  8.9 8.5*  MG 1.7  --    GFR: Estimated Creatinine Clearance: 88.7 mL/min (by C-G formula based on SCr of 0.79 mg/dL). Liver Function Tests: Recent Labs  Lab 01/14/23 1058 01/14/23 1420  AST 238* 212*  ALT 158* 146*  ALKPHOS 1,505* 1,367*  BILITOT 0.7 0.7  PROT 7.3 7.0  ALBUMIN  2.2* 2.0*   No results for input(s): LIPASE, AMYLASE in the last 168 hours. No results for input(s): AMMONIA in the last 168 hours. Coagulation Profile: No results for input(s): INR, PROTIME in the last 168 hours. Cardiac Enzymes: No results for input(s): CKTOTAL, CKMB, CKMBINDEX, TROPONINI in the last 168 hours. BNP (last 3 results) No results for input(s): PROBNP in the last 8760 hours. HbA1C: No results for input(s): HGBA1C in the last 72 hours. CBG: No  results for input(s): GLUCAP in the last 168 hours. Lipid Profile: No results for input(s): CHOL, HDL, LDLCALC, TRIG, CHOLHDL, LDLDIRECT in the last 72 hours. Thyroid  Function Tests: No results for input(s): TSH, T4TOTAL, FREET4, T3FREE, THYROIDAB in the last 72 hours. Anemia Panel: No results for input(s): VITAMINB12, FOLATE, FERRITIN, TIBC, IRON, RETICCTPCT in the last 72 hours. Urine analysis:    Component Value Date/Time   COLORURINE YELLOW 12/24/2022 2003   APPEARANCEUR CLEAR 12/24/2022 2003   APPEARANCEUR Clear 08/02/2021 1036   LABSPEC 1.005 12/24/2022 2003   PHURINE 5.0 12/24/2022 2003    GLUCOSEU NEGATIVE 12/24/2022 2003   HGBUR SMALL (A) 12/24/2022 2003   BILIRUBINUR NEGATIVE 12/24/2022 2003   BILIRUBINUR Negative 08/02/2021 1036   KETONESUR NEGATIVE 12/24/2022 2003   PROTEINUR 65 (A) 01/07/2023 0824   NITRITE NEGATIVE 12/24/2022 2003   LEUKOCYTESUR NEGATIVE 12/24/2022 2003    Radiological Exams on Admission: I have personally reviewed images US  Abdomen Limited Result Date: 01/14/2023 CLINICAL DATA:  Evaluate for ascites EXAM: LIMITED ABDOMEN ULTRASOUND FOR ASCITES TECHNIQUE: Limited ultrasound survey for ascites was performed in all four abdominal quadrants. COMPARISON:  CT abdomen pelvis 12/24/2022 FINDINGS: Large volume ascites demonstrated throughout all 4 quadrants. IMPRESSION: Large volume ascites. Electronically Signed   By: Bard Moats M.D.   On: 01/14/2023 15:42    EKG: I have personally reviewed EKG: no EKG on chart  Assessment/Plan Principal Problem:   Ascites, malignant Active Problems:   Cancer associated pain   Ovarian cancer, bilateral (HCC)   Palliative care patient   GERD (gastroesophageal reflux disease)    Assessment and Plan: * Ascites, malignant Patient with stage 4 serous ovarian cancer with mets now with massive ascites compromising respirations. [facility]acentesis cannot be performed at [facility] until Friday. Patient will be transferred to [facility] for paracentesis by IR.  Plan Transfer patient to [facility]  IR at [facility] notified - order is in for paracentesis.  DVT prophylaxis with SQ heparin  - no dosing after midnight  Cancer associated pain Patient has been adequately controlled with Oxycodone  36 mg product 3-4 times a day plus dilaudid  for breakthrough pain.  Plan Oxycontin  40 mg q8  Dilaudid  for breakthru pain.   Palliative care patient Patient has seen palliative care in the past. She is aware of her prognosis.  Plan TOC consult re: in home care/hospice  Ovarian cancer, bilateral (HCC) Per Dr. MARLA at [facility]. She has had one round  of chemo. This is her third recurrence of ovarian CA and she is aware of her prognosis.  Plan Per Dr. MARLA as to next round of chemo  Will D/C all unnecessary medications  Discussed code status with patient - she is DNR/DNI  GERD (gastroesophageal reflux disease) Patient denies active GERD  Plan Continue PPI   Disposition - TOC consult re: home/hospice care    DVT prophylaxis: SQ Heparin  Code Status: DNR/DNI(Do NOT Intubate) Family Communication: friend at bedside  Disposition Plan: home when medically stable  Consults called: IR - Dr. Katha at [facility] agrees to perform paracentesis 01/15/23  Admission status: Observation, Med-Surg   Ozell Haggis, MD [facility] 01/14/2023, 5:31 PM

## 2023-01-14 NOTE — Assessment & Plan Note (Signed)
 Per Dr[facility] MARLA at [facility]. She has had one round of chemo. This is her third recurrence of ovarian CA and she is aware of her prognosis.  Plan Per Dr. MARLA as to next round of chemo  Will D/C all unnecessary medications  Discussed code status with patient - she is DNR/DNI

## 2023-01-14 NOTE — Progress Notes (Signed)
 50  [facility] [address], [place] [address]    Clinic Day:  01/14/23   Referring physician: Rosamond Leta NOVAK, MD  Patient Care Team: Rosamond Leta NOVAK, MD as PCP - General (Internal Medicine) Rogers Hai, MD as Medical Oncologist (Oncology)   ASSESSMENT & PLAN:   Assessment: 1.  Clinical stage IVa high-grade serous ovarian cancer, positive cytology of left pleural effusion: -4 cycles of carboplatin  and paclitaxel  from 08/24/2018 through 12/01/2018. -Robotic assisted laparoscopic TAH and BSO and omentectomy on 12/24/2018, pathology showing high-grade serous carcinoma, PT3P NX. -Germline mutation testing was negative. -3 more cycles of adjuvant chemotherapy completed on 03/15/2019. -CTAP on 04/13/2019 showed no findings of active malignancy.  28% reduction in the volume of presumed chronic hematoma/chronic fluid collection splaying the upper margin of the spleen.  Large type III hiatal hernia. -CTAP on 10/13/2019 shows no findings of recurrence or metastatic disease. - Foundation 1 shows HRD+, LOH score>16%.  MSI-stable.  MYC amplification.  T p53 mutation. - We reviewed CT CAP from 02/21/2021 which showed progressive peritoneal metastasis compared to 12/06/2020 scan.  No bowel obstruction.  Small right pleural effusion with mild enlargement of an isolated left external iliac lymph node. - Reviewed EGD from 02/28/2021 which showed hiatal hernia and normal findings. - She reported epigastric and left upper quadrant pain worse in the last 1 week which is related to progression of her malignancy. - 6 cycles of carboplatin  and paclitaxel  from 03/13/2021 through 06/18/2021 - CT CAP (07/12/2021): No evidence of recurrence or metastatic disease. - Olaparib  300 mg twice daily from 08/22/2021 through 11/28/2022 with progression - Cycle 1 carboplatin , gemcitabine  and bevacizumab  on 01/07/2023    Plan: 1.  Clinical stage IVa high-grade serous ovarian cancer, positive cytology of  left pleural effusion, HRD positive: - She received cycle 1 day 1 of carboplatin , gemcitabine  and bevacizumab  on 01/07/2023. - She missed appointment for paracentesis on 01/10/2023 as she was feeling very sick and vomiting on that day. - Today she is in distress from abdominal distention.  She also has swelling up to her thighs from pressure. - Labs today: AST and ALT are elevated 6 times above the upper limit of normal.  Mild leukopenia and thrombocytopenia was seen as expected. - I think she needs immediate paracentesis.  I have reached out to the ER physician who will coordinate the process. - She will come back on Thursday for her day 8 of chemotherapy.   2.  Lower back/left upper quadrant pain: - She is taking oxycodone  36 mg every 6 hours and Dilaudid  8 mg every 8 hours as needed.  She takes half tablet of Tylenol  occasionally.   3.  Numbness in the feet/cramping in the hands: - Continue gabapentin  200 mg at bedtime.  4.  Iron deficiency state: - Last Feraheme  on 06/24/2022.  Hemoglobin today is 11.3.  5.  Constipation: - Continue stool softeners daily as needed.  6.  Hypomagnesemia: - Magnesium  is normal today.  Continue magnesium  twice daily.    No orders of the defined types were placed in this encounter.     LILLETTE Verneta SAUNDERS Teague,acting as a neurosurgeon for Hai Rogers, MD.,have documented all relevant documentation on the behalf of Hai Rogers, MD,as directed by  Hai Rogers, MD while in the presence of Hai Rogers, MD.  I, Hai Rogers MD, have reviewed the above documentation for accuracy and completeness, and I agree with the above.        Hai Rogers, MD  12/31/20241:47 PM  CHIEF COMPLAINT:   Diagnosis: high-grade serous ovarian cancer    Cancer Staging  No matching staging information was found for the patient.    Prior Therapy: 1. Carboplatin  and paclitaxel  x 7 cycles from 08/24/2018 to 03/15/2019. 2.  Laparoscopic TAH & BSO & omenectomy on 12/24/2018. 3.  6 cycles of carboplatin  and paclitaxel  from 03/05/2021 to 06/18/2021  Current Therapy:  olaparib     HISTORY OF PRESENT ILLNESS:   Oncology History  Ovarian cancer, bilateral (HCC)  07/07/2018 Pathology Results   PLEURAL FLUID, LEFT (SPECIMEN 1 OF 1, COLLECTED 07/07/18): - MALIGNANT CELLS CONSISTENT WITH ADENOCARCINOMA - SEE COMMENT  Source Pleural Fluid, (Specimen 1 of 1, collected on 07/07/2018) Gross Specimen: Received is/are 1000cc of bloody red fluid with tissue. (TC:tc) Prepared: # Smears: 0 # Concentration Technique Slides (i.e. ThinPrep): 1 # Cell Block: 1 Conventional Additional Studies: Two Hematology slides labeled [id number] Comment The malignant cells are positive for cytokeratin 7, p53, WT-1, Pax-8, Moc31, ER (weak) and EMA but negative for cytokeratin 20, TTF-1, GATA-3, CDX-2 and D2-40. Overall, the phenotype is consistent with a gynecologic primary; clinical correlation recommended.   07/07/2018 Procedure   Successful ultrasound guided left thoracentesis yielding 2.0 L of pleural fluid   07/08/2018 Procedure   1. Technically successful placement of left 14 French pigtail chest drain, placed to Pleur-evac water -seal.   07/08/2018 Procedure   1. Technically successful five French double lumen power injectable PICC placement   07/09/2018 Imaging   Ct chest 1. There is a moderate, loculated left hydropneumothorax with a small air component and moderate fluid component. The largest loculated component is located posteriorly. There is a pigtail drainage catheter about the lateral pleural space. There is no obvious etiology, such as obvious mass or pleural disease.   2. There is a small right pleural effusion with associated atelectasis or consolidation and a subpleural consolidation of the superior segment right lower lobe (series 4, image 56), of uncertain significance, possibly infectious or inflammatory   07/10/2018 Imaging    Ct abdomen and pelvis: 1. The bilateral ovaries are enlarged by heterogeneous appearing cystic lesions, measuring 5.3 x 4.2 cm on the right (series 4, image 72) and 4.5 x 3.2 cm on the left (series 4, image 75). Consider dedicated pelvic ultrasound and/or pelvic MRI to further evaluate for solid components given high suspicion for GYN primary malignancy.   2. No other evidence of mass and no lymphadenopathy in the abdomen or pelvis.   3. Trace ascites. There is some suggestion of omental and peritoneal nodularity (e.g. Series 4, image 43), concerning for peritoneal metastatic disease.    4. Loculated left-sided pleural effusion with left-sided pleural drainage catheter in position. Small right pleural effusion   07/13/2018 Surgery   OPERATION: 1.  Left VATS (video-assisted thoracoscopic surgery) for drainage of loculated pleural effusion. 2.  Talc  pleurodesis for malignant pleural effusion. 3.  Placement of PleurX catheter for management of malignant pleural effusion. 4.  Placement of On-Q analgesia catheter system.    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   07/13/2018 Pathology Results   Pleura, peel, Left Pleural - FIBRO-FIBRINOUS PLEURITIS - NEGATIVE FOR MALIGNANCY   07/18/2018 Initial Diagnosis   Ovarian cancer, bilateral (HCC)   07/20/2018 Procedure   EGD impression: Normal proximal esophagus and mid esophagus. Mild distal esophageal rings; dilation not performed  because of esophagitis. LA Grade C reflux esophagitis. Z-line regular, 30 cm from the incisors. 5 cm hiatal hernia. Non-bleeding gastric ulcer with no stigmata of bleeding. Gastritis. Duodenal erosions without bleeding. Normal second portion of the duodenum. No specimens collected.   08/19/2018 Genetic Testing   Negative  genetic testing on the common hereditary cancer panel.  The Common Hereditary Gene Panel offered by [facility] includes sequencing and/or deletion duplication testing of the following 48 genes: APC, ATM, AXIN2, BARD1, BMPR1A, BRCA1, BRCA2, BRIP1, CDH1, CDK4, CDKN2A (p14ARF), CDKN2A (p16INK4a), CHEK2, CTNNA1, DICER1, EPCAM (Deletion/duplication testing only), GREM1 (promoter region deletion/duplication testing only), KIT, MEN1, MLH1, MSH2, MSH3, MSH6, MUTYH, NBN, NF1, NHTL1, PALB2, PDGFRA, PMS2, POLD1, POLE, PTEN, RAD50, RAD51C, RAD51D, RNF43, SDHB, SDHC, SDHD, SMAD4, SMARCA4. STK11, TP53, TSC1, TSC2, and VHL.  The following genes were evaluated for sequence changes only: SDHA and HOXB13 c.251G>A variant only. The report date is August 19, 2018.   08/24/2018 - 03/15/2019 Chemotherapy   Patient is on Treatment Plan : OVARIAN Carboplatin  (AUC 6) / Paclitaxel  (175) q21d x 6 cycles     03/05/2021 - 06/18/2021 Chemotherapy   Patient is on Treatment Plan : OVARIAN Carboplatin  (AUC 6) / Paclitaxel  (175) q21d x 6 cycles     01/07/2023 -  Chemotherapy   Patient is on Treatment Plan : OVARIAN RECURRENT 3RD LINE Carboplatin  D1 + Gemcitabine  D1,8 (4/800) q21d        INTERVAL HISTORY:   Pakou is a 52 y.o. female presenting to clinic today for follow up of high-grade serous ovarian cancer. She was last seen by me on 01/07/23.  Today, she states that she is doing well overall. Her appetite level is at 0%. Her energy level is at 0%. She is accompanied by her a family member.  She reports bilateral swelling and soreness on the upper legs. Her abdominal distention is painful. After her last visit, she slept for 4 days and after has had on and off nausea that has not abated. She has been drinking apple juice. She has been unable to eat anything for 4 weeks, as she will vomit any time she attempts to eat.   She notes back pain. She has not taken any Tylenol .   PAST MEDICAL HISTORY:   Past Medical History: Past Medical  History:  Diagnosis Date   Anemia    Anxiety and depression    Arthritis of facet joints at multiple vertebral levels    L5-S1   Constipation    Dyslipidemia    Family history of breast cancer    Family history of uterine cancer    GERD (gastroesophageal reflux disease)    History of hiatal hernia    History of kidney stones    Insomnia    Irritable bowel syndrome    Migraine    Muscle tension headache    Neuropathy of finger    Ovarian carcinoma (HCC)    ovarian   Plantar fasciitis of right foot    Port-A-Cath in place 08/20/2018    Surgical History: Past Surgical History:  Procedure Laterality Date   ABDOMINAL HYSTERECTOMY     BIOPSY  10/27/2020   Procedure: BIOPSY;  Surgeon: Eartha Angelia Sieving, MD;  [facility];  Service: Gastroenterology;;   CHOLECYSTECTOMY  2008   COLONOSCOPY N/A 08/13/2013   Procedure: COLONOSCOPY;  Surgeon: Claudis RAYMOND Rivet, MD;  [facility]: [facility];  Service: Endoscopy;  Laterality: N/A;  230-moved to 145 Ann to notify pt   COLONOSCOPY WITH PROPOFOL   N/A 11/15/2020   Procedure: COLONOSCOPY WITH PROPOFOL ;  Surgeon: Golda Claudis PENNER, MD;  [facility];  Service: Endoscopy;  Laterality: N/A;  1:40   ESOPHAGEAL DILATION N/A 02/28/2021   Procedure: ESOPHAGEAL DILATION;  Surgeon: Golda Claudis PENNER, MD;  [facility];  Service: Endoscopy;  Laterality: N/A;   ESOPHAGOGASTRODUODENOSCOPY     ESOPHAGOGASTRODUODENOSCOPY (EGD) WITH PROPOFOL  N/A 07/20/2018   Procedure: ESOPHAGOGASTRODUODENOSCOPY (EGD) WITH PROPOFOL ;  Surgeon: Golda Claudis PENNER, MD;  [facility];  Service: Endoscopy;  Laterality: N/A;  Possible esophageal dilation.   ESOPHAGOGASTRODUODENOSCOPY (EGD) WITH PROPOFOL  N/A 10/27/2020   Procedure: ESOPHAGOGASTRODUODENOSCOPY (EGD) WITH PROPOFOL ;  Surgeon: Eartha Angelia Sieving, MD;  [facility];  Service: Gastroenterology;  Laterality: N/A;  2:10, pt knows to arrive at 10:15    ESOPHAGOGASTRODUODENOSCOPY (EGD) WITH PROPOFOL  N/A 02/28/2021   Procedure: ESOPHAGOGASTRODUODENOSCOPY (EGD) WITH PROPOFOL ;  Surgeon: Golda Claudis PENNER, MD;  [facility];  Service: Endoscopy;  Laterality: N/A;  200 ASA 1   ESOPHAGOGASTRODUODENOSCOPY (EGD) WITH PROPOFOL  N/A 03/29/2022   Procedure: ESOPHAGOGASTRODUODENOSCOPY (EGD) WITH PROPOFOL ;  Surgeon: Eartha Angelia Sieving, MD;  [facility];  Service: Gastroenterology;  Laterality: N/A;  2:00 pm, asa 1-2   GIVENS CAPSULE STUDY N/A 11/24/2020   Procedure: GIVENS CAPSULE STUDY;  Surgeon: Golda Claudis PENNER, MD;  [facility];  Service: Endoscopy;  Laterality: N/A;  7:30   IR ANGIOGRAM SELECTIVE EACH ADDITIONAL VESSEL  08/01/2018   IR ANGIOGRAM VISCERAL SELECTIVE  08/01/2018   IR EMBO ART  VEN HEMORR LYMPH EXTRAV  INC GUIDE ROADMAPPING  08/01/2018   IR IMAGING GUIDED PORT INSERTION  08/20/2018   IR PERC PLEURAL DRAIN W/INDWELL CATH W/IMG GUIDE  07/08/2018   IR THORACENTESIS ASP PLEURAL SPACE W/IMG GUIDE  07/07/2018   IR US  GUIDE VASC ACCESS RIGHT  08/01/2018   PLEURAL EFFUSION DRAINAGE Left 07/13/2018   Procedure: DRAINAGE OF LOCULATED PLEURAL EFFUSION;  Surgeon: Fleeta Hanford Coy, MD;  Location: [facility] OR;  Service: Thoracic;  Laterality: Left;   POLYPECTOMY  11/15/2020   Procedure: POLYPECTOMY;  Surgeon: Golda Claudis PENNER, MD;  [facility];  Service: Endoscopy;;   REMOVAL OF PLEURAL DRAINAGE CATHETER Left 08/20/2018   Procedure: REMOVAL OF PLEURAL DRAINAGE CATHETER;  Surgeon: Fleeta Hanford Coy, MD;  Location: [facility] OR;  Service: Thoracic;  Laterality: Left;   REMOVAL OF PLEURAL DRAINAGE CATHETER Left 08/20/2018   Procedure: REMOVAL OF PLEURAL DRAINAGE CATHETER;  Surgeon: Fleeta Hanford Coy, MD;  Location: [facility] OR;  Service: Thoracic;  Laterality: Left;   TALC  PLEURODESIS Left 07/13/2018   Procedure: Talc  Pleuradesis;  Surgeon: Fleeta Hanford, Coy, MD;  Location: [facility] OR;  Service: Thoracic;  Laterality: Left;   TOTAL HIP  ARTHROPLASTY Left 06/05/2020   Procedure: LEFT TOTAL HIP ARTHROPLASTY ANTERIOR APPROACH;  Surgeon: Jerri Kay HERO, MD;  Location: [facility] OR;  Service: Orthopedics;  Laterality: Left;  3-C   TUBAL LIGATION Bilateral    UTERINE ABLATION     VIDEO ASSISTED THORACOSCOPY Left 07/13/2018   Procedure: VIDEO ASSISTED THORACOSCOPY;  Surgeon: Fleeta Hanford Coy, MD;  Location: [facility] OR;  Service: Thoracic;  Laterality: Left;    Social History: Social History   Socioeconomic History   Marital status: Widowed    Spouse name: Not on file   Number of children: 2   Years of education: 2-College   Highest education level: Not on file  Occupational History    Employer: [employer]  Tobacco Use   Smoking status:  Every Day    Current packs/day: 0.50    Average packs/day: 0.5 packs/day for 17.1 years (8.5 ttl pk-yrs)    Types: Cigarettes    Start date: 06/21/2001    Last attempt to quit: 06/22/2018   Smokeless tobacco: Never  Vaping Use   Vaping status: Never Used  Substance and Sexual Activity   Alcohol use: Not Currently    Alcohol/week: 1.0 standard drink of alcohol    Types: 1 Glasses of wine per week    Comment: occasionally   Drug use: No   Sexual activity: Not Currently  Other Topics Concern   Not on file  Social History Narrative   Patient lives at home with her daughter.    Patient has 2 children.    Patient is widowed.    Patient is right handed.    Patient has her Associates degree.      Social Drivers of Corporate Investment Banker Strain: Low Risk  (08/26/2022)   Received from [facility]   Overall Financial Resource Strain (CARDIA)    Difficulty of Paying Living Expenses: Not hard at all  Food Insecurity: No Food Insecurity (12/24/2022)   Hunger Vital Sign    Worried About Running Out of Food in the Last Year: Never true    Ran Out of Food in the Last Year: Never true  Transportation Needs: No Transportation Needs (12/24/2022)   PRAPARE - Scientist, Research (physical Sciences) (Medical): No    Lack of Transportation (Non-Medical): No  Physical Activity: Inactive (07/22/2018)   Exercise Vital Sign    Days of Exercise per Week: 0 days    Minutes of Exercise per Session: 0 min  Stress: Stress Concern Present (07/22/2018)   [employer] of Occupational Health - Occupational Stress Questionnaire    Feeling of Stress : Very much  Social Connections: Moderately Isolated (07/22/2018)   Social Connection and Isolation Panel [NHANES]    Frequency of Communication with Friends and Family: More than three times a week    Frequency of Social Gatherings with Friends and Family: More than three times a week    Attends Religious Services: 1 to 4 times per year    Active Member of [employer] or Organizations: No    Attends Banker Meetings: Never    Marital Status: Widowed  Intimate Partner Violence: Not At Risk (12/24/2022)   Humiliation, Afraid, Rape, and Kick questionnaire    Fear of Current or Ex-Partner: No    Emotionally Abused: No    Physically Abused: No    Sexually Abused: No    Family History: Family History  Problem Relation Age of Onset   Depression Mother    Hypertension Mother    Obesity Mother    Diabetes Mother    Kidney disease Mother    Peripheral vascular disease Father    Atrial fibrillation Father    Crohn's disease Sister    Uterine cancer Sister 44       maternal half sister   COPD Brother    Osteoporosis Brother    Breast cancer Maternal Aunt 50   Colon cancer Neg Hx     Current Medications: No current facility-administered medications for this visit.  Current Outpatient Medications:    albuterol  (VENTOLIN  HFA) 108 (90 Base) MCG/ACT inhaler, Inhale 2 puffs into the lungs every 6 (six) hours as needed for wheezing or shortness of breath., Disp: , Rfl:    Bioflavonoid Products (VITAMIN C) CHEW,  Chew 1 tablet by mouth daily., Disp: , Rfl:    Cholecalciferol  (VITAMIN D3) 50 MCG (2000 UT) TABS, Take 2,000 Units by  mouth daily., Disp: , Rfl:    Cyanocobalamin (B-12) 3000 MCG CAPS, Take 3,000 mcg by mouth daily., Disp: , Rfl:    diazepam  (VALIUM ) 5 MG tablet, Take 1 tablet (5 mg total) by mouth at bedtime., Disp: 30 tablet, Rfl: 5   dicyclomine  (BENTYL ) 20 MG tablet, Take 1 tablet (20 mg total) by mouth every 6 (six) hours as needed for spasms., Disp: 120 tablet, Rfl: 1   docusate sodium  (COLACE) 100 MG capsule, Take 200 mg by mouth in the morning and at bedtime., Disp: , Rfl:    furosemide  (LASIX ) 20 MG tablet, Take 1 tablet (20 mg total) by mouth daily as needed. (Patient taking differently: Take 20 mg by mouth in the morning.), Disp: 30 tablet, Rfl: 2   gabapentin  (NEURONTIN ) 100 MG capsule, TAKE THREE CAPSULES BY MOUTH DAILY AT BEDTIME, Disp: 90 capsule, Rfl: 3   HYDROmorphone  (DILAUDID ) 8 MG tablet, Take 1 tablet (8 mg total) by mouth every 4 (four) hours as needed (Breakthrough Pain)., Disp: 180 tablet, Rfl: 0   Lido-PE-Glycerin-Petrolatum (PREPARATION H RAPID RELIEF) 5-0.25-14.4-15 % CREA, Apply 1 application  topically every 6 (six) hours as needed (for hemorrhoids)., Disp: , Rfl:    magnesium  oxide (MAG-OX) 400 (240 Mg) MG tablet, Take 1 tablet (400 mg total) by mouth 2 (two) times daily., Disp: 60 tablet, Rfl: 6   naloxegol  oxalate (MOVANTIK ) 25 MG TABS tablet, Take 1 tablet (25 mg total) by mouth daily., Disp: 30 tablet, Rfl: 0   naloxegol  oxalate (MOVANTIK ) 25 MG TABS tablet, Take 1 tablet (25 mg total) by mouth daily., Disp: 30 tablet, Rfl: 0   omeprazole  (PRILOSEC) 40 MG capsule, TAKE ONE CAPSULE BY MOUTH TWICE DAILY BEFORE MEALS (Patient taking differently: Take 40 mg by mouth at bedtime.), Disp: 60 capsule, Rfl: 5   ondansetron  (ZOFRAN ) 4 MG tablet, Take 4 mg by mouth every 8 (eight) hours as needed., Disp: , Rfl:    ondansetron  (ZOFRAN -ODT) 4 MG disintegrating tablet, Take 1 tablet (4 mg total) by mouth every 8 (eight) hours as needed for nausea or vomiting., Disp: 20 tablet, Rfl: 2   oxyCODONE   ER 36 MG C12A, Take 1 capsule (36 mg total) by mouth 4 (four) times daily., Disp: 60 capsule, Rfl: 0   prochlorperazine  (COMPAZINE ) 10 MG tablet, Take 10 mg by mouth 2 (two) times daily as needed for nausea or vomiting., Disp: , Rfl:    rosuvastatin  (CRESTOR ) 5 MG tablet, Take 5 mg by mouth at bedtime., Disp: , Rfl:    venlafaxine  XR (EFFEXOR -XR) 150 MG 24 hr capsule, Take 150 mg by mouth at bedtime., Disp: , Rfl:   Facility-Administered Medications Ordered in Other Visits:    0.9 %  sodium chloride  infusion, , Intravenous, Continuous, Rogers Hai, MD, Stopped at 10/23/20 1547   dicyclomine  (BENTYL ) injection 20 mg, 20 mg, Intramuscular, Once, Beatty, Celeste A, PA-C   HYDROmorphone  (DILAUDID ) injection 1 mg, 1 mg, Intravenous, Once, Beatty, Celeste A, PA-C   sodium chloride  flush (NS) 0.9 % injection 10 mL, 10 mL, Intravenous, PRN, Shamecka Hocutt, MD, 10 mL at 10/23/20 1544   Allergies: Allergies  Allergen Reactions   Morphine  And Codeine Itching   Nickel Itching   Nortriptyline  Other (See Comments)    Significant weight gain   Topamax  [Topiramate ] Diarrhea and Nausea Only   Xanax  [Alprazolam ] Other (See Comments)  Can't wake up   Actifed Cold-Allergy [Chlorpheniramine-Phenyleph Er] Rash and Other (See Comments)    Red dye only   Amoxicillin Rash   Codeine Hives   Erythromycin Rash   Penicillins Rash        Red Dye Rash   Sudafed [Pseudoephedrine Hcl] Rash and Other (See Comments)    Red dye only    REVIEW OF SYSTEMS:   Review of Systems  Constitutional:  Positive for fatigue. Negative for chills and fever.  HENT:   Negative for lump/mass, mouth sores, nosebleeds, sore throat and trouble swallowing.   Eyes:  Negative for eye problems.  Respiratory:  Positive for shortness of breath. Negative for cough.   Cardiovascular:  Positive for leg swelling. Negative for chest pain and palpitations.  Gastrointestinal:  Positive for constipation, nausea and vomiting.  Negative for abdominal pain and diarrhea.  Genitourinary:  Negative for bladder incontinence, difficulty urinating, dysuria, frequency, hematuria and nocturia.   Musculoskeletal:  Positive for back pain (8/10 severity). Negative for arthralgias, flank pain, myalgias and neck pain.       +pain throughout the body, 8/10 severity  Skin:  Negative for itching and rash.  Neurological:  Positive for dizziness. Negative for headaches and numbness.  Hematological:  Does not bruise/bleed easily.  Psychiatric/Behavioral:  Positive for sleep disturbance. Negative for depression and suicidal ideas. The patient is not nervous/anxious.   All other systems reviewed and are negative.    VITALS:   There were no vitals taken for this visit.  Wt Readings from Last 3 Encounters:  01/14/23 200 lb (90.7 kg)  01/14/23 200 lb (90.7 kg)  01/07/23 201 lb 6.4 oz (91.4 kg)    There is no height or weight on file to calculate BMI.  Performance status (ECOG): 1 - Symptomatic but completely ambulatory  PHYSICAL EXAM:   Physical Exam Vitals and nursing note reviewed. Exam conducted with a chaperone present.  Constitutional:      Appearance: Normal appearance.  Cardiovascular:     Rate and Rhythm: Normal rate and regular rhythm.     Pulses: Normal pulses.     Heart sounds: Normal heart sounds.  Pulmonary:     Effort: Pulmonary effort is normal.     Breath sounds: Normal breath sounds.  Abdominal:     General: There is distension.     Palpations: Abdomen is soft. There is no hepatomegaly, splenomegaly or mass.     Tenderness: There is no abdominal tenderness.     Comments: Yense  Anasarca to upper thighs  Musculoskeletal:     Right lower leg: 2+ Edema present.     Left lower leg: 2+ Edema present.  Lymphadenopathy:     Cervical: No cervical adenopathy.     Right cervical: No superficial, deep or posterior cervical adenopathy.    Left cervical: No superficial, deep or posterior cervical adenopathy.      Upper Body:     Right upper body: No supraclavicular or axillary adenopathy.     Left upper body: No supraclavicular or axillary adenopathy.  Neurological:     General: No focal deficit present.     Mental Status: She is alert and oriented to person, place, and time.  Psychiatric:        Mood and Affect: Mood normal.        Behavior: Behavior normal.     LABS:      Latest Ref Rng & Units 01/14/2023   10:58 AM 01/07/2023    8:24 AM  12/30/2022    5:57 AM  CBC  WBC 4.0 - 10.5 K/uL 3.3  16.7  9.7   Hemoglobin 12.0 - 15.0 g/dL 88.6  87.8  88.4   Hematocrit 36.0 - 46.0 % 34.2  36.5  34.8   Platelets 150 - 400 K/uL 119  532  669       Latest Ref Rng & Units 01/14/2023   10:58 AM 01/07/2023    8:24 AM 12/30/2022    5:57 AM  CMP  Glucose 70 - 99 mg/dL 868  871  899   BUN 6 - 20 mg/dL 20  34  32   Creatinine 0.44 - 1.00 mg/dL 9.05  9.08  8.90   Sodium 135 - 145 mmol/L 128  127  133   Potassium 3.5 - 5.1 mmol/L 4.1  4.7  4.6   Chloride 98 - 111 mmol/L 92  89  96   CO2 22 - 32 mmol/L 24  25  27    Calcium  8.9 - 10.3 mg/dL 8.9  8.6  8.8   Total Protein 6.5 - 8.1 g/dL 7.3  6.7    Total Bilirubin 0.0 - 1.2 mg/dL 0.7  0.4    Alkaline Phos 38 - 126 U/L 1,505  468    AST 15 - 41 U/L 238  41    ALT 0 - 44 U/L 158  37       No results found for: CEA1, CEA / No results found for: CEA1, CEA No results found for: PSA1 No results found for: CAN199 Lab Results  Component Value Date   CAN125 151.0 (H) 11/12/2022    No results found for: STEPHANY CARLOTA BENSON MARKEL EARLA JOANNIE DOC, MSPIKE, SPEI Lab Results  Component Value Date   TIBC 288 11/12/2022   TIBC 329 08/05/2022   TIBC 346 05/27/2022   FERRITIN 206 11/12/2022   FERRITIN 219 08/05/2022   FERRITIN 67 05/27/2022   IRONPCTSAT 17 11/12/2022   IRONPCTSAT 29 08/05/2022   IRONPCTSAT 14 05/27/2022   Lab Results  Component Value Date   LDH 277 (H) 07/18/2018   LDH 193 (H)  07/07/2018     STUDIES:   US  Paracentesis Result Date: 12/25/2022 INDICATION: Patient with a history of ovarian cancer with recurrent ascites. Interventional radiology asked to perform a therapeutic paracentesis. EXAM: ULTRASOUND GUIDED PARACENTESIS MEDICATIONS: 1% lidocaine  10 mL COMPLICATIONS: None immediate. PROCEDURE: Informed written consent was obtained from the patient after a discussion of the risks, benefits and alternatives to treatment. A timeout was performed prior to the initiation of the procedure. Initial ultrasound scanning demonstrates a large amount of ascites within the right lower abdominal quadrant. The right lower abdomen was prepped and draped in the usual sterile fashion. 1% lidocaine  was used for local anesthesia. Following this, a 19 gauge, 7-cm, Yueh catheter was introduced. An ultrasound image was saved for documentation purposes. The paracentesis was performed. The catheter was removed and a dressing was applied. The patient tolerated the procedure well without immediate post procedural complication. FINDINGS: A total of approximately 2 L of clear yellow fluid was removed. IMPRESSION: Successful ultrasound-guided paracentesis yielding 2 liters of peritoneal fluid. Procedure performed by Warren Dais NP Electronically Signed   By: Ami Bellman D.O.   On: 12/25/2022 12:20   CT ABDOMEN PELVIS W CONTRAST Result Date: 12/24/2022 CLINICAL DATA:  Abdominal pain for 2 weeks, stage IV ovarian cancer EXAM: CT ABDOMEN AND PELVIS WITH CONTRAST TECHNIQUE: Multidetector CT imaging of the abdomen and pelvis was performed  using the standard protocol following bolus administration of intravenous contrast. RADIATION DOSE REDUCTION: This exam was performed according to the departmental dose-optimization program which includes automated exposure control, adjustment of the mA and/or kV according to patient size and/or use of iterative reconstruction technique. CONTRAST:  OMNIPAQUE   IOHEXOL  300 MG/ML  SOLN COMPARISON:  12/15/2022 FINDINGS: Lower chest: No acute pleural or parenchymal lung disease. Hepatobiliary: No focal liver abnormality is seen. Status post cholecystectomy. No biliary dilatation. Pancreas: Unremarkable. No pancreatic ductal dilatation or surrounding inflammatory changes. Spleen: Stable complex and partially calcified splenic cyst. Spleen is stable. Adrenals/Urinary Tract: Adrenal glands are unremarkable. Kidneys are normal, without renal calculi, focal lesion, or hydronephrosis. Bladder is decompressed. Stomach/Bowel: No bowel obstruction or ileus.  Stable hiatal hernia. Vascular/Lymphatic: Stable aortic atherosclerosis. No other significant vascular finding. No discrete adenopathy within the abdomen or pelvis. Reproductive: Prior hysterectomy.  No adnexal mass. Other: Marked progression of the peritoneal carcinomatosis seen previously, with diffuse mesenteric nodules and increasing complex ascites throughout the abdomen and pelvis. No free intraperitoneal gas. No abdominal wall hernia. Musculoskeletal: Left hip arthroplasty. No acute or destructive bony abnormalities. Reconstructed images demonstrate no additional findings. IMPRESSION: 1. Worsening diffuse peritoneal carcinomatosis, with large volume complex ascites throughout the abdomen and pelvis increased since prior study. Findings are consistent with progressive metastatic ovarian cancer. 2. No bowel obstruction or ileus.  Stable hiatal hernia. 3.  Aortic Atherosclerosis (ICD10-I70.0). Electronically Signed   By: Ozell Daring M.D.   On: 12/24/2022 19:14   CT Angio Chest/Abd/Pel for Dissection W and/or Wo Contrast Result Date: 12/15/2022 CLINICAL DATA:  Upper abdominal pain for 1 week. History of ovarian cancer. EXAM: CT ANGIOGRAPHY CHEST, ABDOMEN AND PELVIS TECHNIQUE: Non-contrast CT of the chest was initially obtained. Multidetector CT imaging through the chest, abdomen and pelvis was performed using the standard  protocol during bolus administration of intravenous contrast. Multiplanar reconstructed images and MIPs were obtained and reviewed to evaluate the vascular anatomy. RADIATION DOSE REDUCTION: This exam was performed according to the departmental dose-optimization program which includes automated exposure control, adjustment of the mA and/or kV according to patient size and/or use of iterative reconstruction technique. CONTRAST:  OMNIPAQUE  IOHEXOL  350 MG/ML SOLN COMPARISON:  November 22, 2022. FINDINGS: CTA CHEST FINDINGS Cardiovascular: Preferential opacification of the thoracic aorta. No evidence of thoracic aortic aneurysm or dissection. Normal heart size. No pericardial effusion. Mediastinum/Nodes: Small hiatal hernia. No adenopathy. Thyroid  gland is unremarkable. Lungs/Pleura: No pneumothorax or pleural effusion is noted. Minimal subsegmental atelectasis is noted posteriorly in right upper lobe. Left lung is clear. Musculoskeletal: No chest wall abnormality. No acute or significant osseous findings. Review of the MIP images confirms the above findings. CTA ABDOMEN AND PELVIS FINDINGS VASCULAR Aorta: Normal caliber aorta without aneurysm, dissection, vasculitis or significant stenosis. Celiac: Patent without evidence of aneurysm, dissection, vasculitis or significant stenosis. SMA: Patent without evidence of aneurysm, dissection, vasculitis or significant stenosis. Renals: Both renal arteries are patent without evidence of aneurysm, dissection, vasculitis, fibromuscular dysplasia or significant stenosis. IMA: Patent without evidence of aneurysm, dissection, vasculitis or significant stenosis. Inflow: Patent without evidence of aneurysm, dissection, vasculitis or significant stenosis. Veins: No obvious venous abnormality within the limitations of this arterial phase study. Review of the MIP images confirms the above findings. NON-VASCULAR Hepatobiliary: No focal liver abnormality is seen. Status post  cholecystectomy. No biliary dilatation. Pancreas: Unremarkable. No pancreatic ductal dilatation or surrounding inflammatory changes. Spleen: Stable appearance of cystic exophytic splenic lesion with internal calcifications. Adrenals/Urinary Tract: Adrenal glands  are unremarkable. Kidneys are normal, without renal calculi, focal lesion, or hydronephrosis. Bladder is unremarkable. Stomach/Bowel: Stomach is unremarkable. There is no evidence of bowel obstruction or inflammation. Lymphatic: 11 mm left common iliac lymph node is noted concerning for metastatic disease. Reproductive: Status post hysterectomy. Other: Mild ascites is noted which is increased compared to prior exam. Increased peritoneal implants are noted compared to prior exam consistent with peritoneal carcinomatosis. Musculoskeletal: Status post left total hip arthroplasty. No acute osseous abnormality is noted. Review of the MIP images confirms the above findings. IMPRESSION: No evidence of thoracic or abdominal aortic dissection or aneurysm. Small hiatal hernia. Stable appearance of cystic exophytic splenic lesion with internal calcifications of unknown etiology. 11 mm left common iliac lymph node is noted concerning for metastatic disease. Mild ascites is noted which is increased compared to prior exam, with increased peritoneal implants noted diffusely consistent with peritoneal carcinomatosis. Electronically Signed   By: Lynwood Landy Raddle M.D.   On: 12/15/2022 17:33

## 2023-01-14 NOTE — Subjective & Objective (Signed)
 Michelle Holt, a 52 y/o with multiple chronic medical problems has stage 4 serous ovarian cancer with peritoneal and pleural implants who was at the Cancer center for treatment. She was noted to have massive ascites that were causing pain and compromising respirations. She was referred by Dr. MARLA to [facility] for paracentesis.

## 2023-01-14 NOTE — ED Notes (Addendum)
Diane, RN from cancer center to ED to remove alteplase from patient's port, which was placed prior to patient's arrival to ED. Patient tolerated well, port has great blood return and is flushing without difficulty.

## 2023-01-14 NOTE — ED Notes (Signed)
 ED TO INPATIENT HANDOFF REPORT  ED Nurse Name and Phone #: Garnette DOROTHA RAMAN Name/Age/Gender Michelle Holt 52 y.o. female Room/Bed: [facility]18/AP[facility]18  Code Status   Code Status: Limited: Do not attempt resuscitation (DNR) -DNR-LIMITED -Do Not Intubate/DNI   Home/SNF/Other Home Patient oriented to: self, place, time, and situation Is this baseline? Yes   Triage Complete: Triage complete  Chief Complaint Ascites, malignant [R18.0]  Triage Note Pt sent from cancer center for shortness of breath and fluid on her abdomen, pt states she has stage 4 ovarian cancer and was sent to ED by Dr. MARLA to have the fluid removed. Pt arrives with PAC to right chest accessed but states it was not flushing   Allergies Allergies  Allergen Reactions   Morphine  And Codeine Itching   Nickel Itching   Nortriptyline  Other (See Comments)    Significant weight gain   Topamax  [Topiramate ] Diarrhea and Nausea Only   Xanax  [Alprazolam ] Other (See Comments)    Can't wake up   Actifed Cold-Allergy [Chlorpheniramine-Phenyleph Er] Rash and Other (See Comments)    Red dye only   Amoxicillin Rash   Codeine Hives   Erythromycin Rash   Penicillins Rash        Red Dye Rash   Sudafed [Pseudoephedrine Hcl] Rash and Other (See Comments)    Red dye only    Level of Care/Admitting Diagnosis ED Disposition     ED Disposition  Admit   Condition  --   Comment  Hospital Area: [facility] [[id number]]  Level of Care: Med-Surg [16]  May place patient in observation at [facility] or [facility] if equivalent level of care is available:: No  Covid Evaluation: Asymptomatic - no recent exposure (last 10 days) testing not required  Diagnosis: Ascites, malignant [785945]  Admitting Physician: HARLOW OZELL BRAVO [[id number]]  Attending Physician: HARLOW OZELL BRAVO [[id number]]          B Medical/Surgery History Past Medical History:  Diagnosis Date   Anemia    Anxiety and depression    Arthritis of facet  joints at multiple vertebral levels    L5-S1   Constipation    Dyslipidemia    Family history of breast cancer    Family history of uterine cancer    GERD (gastroesophageal reflux disease)    History of hiatal hernia    History of kidney stones    Insomnia    Irritable bowel syndrome    Migraine    Muscle tension headache    Neuropathy of finger    Ovarian carcinoma (HCC)    ovarian   Plantar fasciitis of right foot    Port-A-Cath in place 08/20/2018   Past Surgical History:  Procedure Laterality Date   ABDOMINAL HYSTERECTOMY     BIOPSY  10/27/2020   Procedure: BIOPSY;  Surgeon: Eartha Angelia Sieving, MD;  [facility];  Service: Gastroenterology;;   CHOLECYSTECTOMY  2008   COLONOSCOPY N/A 08/13/2013   Procedure: COLONOSCOPY;  Surgeon: Claudis RAYMOND Rivet, MD;  [facility];  Service: Endoscopy;  Laterality: N/A;  230-moved to 145 Ann to notify pt   COLONOSCOPY WITH PROPOFOL  N/A 11/15/2020   Procedure: COLONOSCOPY WITH PROPOFOL ;  Surgeon: Rivet Claudis RAYMOND, MD;  [facility];  Service: Endoscopy;  Laterality: N/A;  1:40   ESOPHAGEAL DILATION N/A 02/28/2021   Procedure: ESOPHAGEAL DILATION;  Surgeon: Rivet Claudis RAYMOND, MD;  [facility];  Service: Endoscopy;  Laterality: N/A;   ESOPHAGOGASTRODUODENOSCOPY  ESOPHAGOGASTRODUODENOSCOPY (EGD) WITH PROPOFOL  N/A 07/20/2018   Procedure: ESOPHAGOGASTRODUODENOSCOPY (EGD) WITH PROPOFOL ;  Surgeon: Golda Claudis PENNER, MD;  [facility];  Service: Endoscopy;  Laterality: N/A;  Possible esophageal dilation.   ESOPHAGOGASTRODUODENOSCOPY (EGD) WITH PROPOFOL  N/A 10/27/2020   Procedure: ESOPHAGOGASTRODUODENOSCOPY (EGD) WITH PROPOFOL ;  Surgeon: Eartha Angelia Sieving, MD;  [facility];  Service: Gastroenterology;  Laterality: N/A;  2:10, pt knows to arrive at 10:15   ESOPHAGOGASTRODUODENOSCOPY (EGD) WITH PROPOFOL  N/A 02/28/2021   Procedure: ESOPHAGOGASTRODUODENOSCOPY (EGD) WITH PROPOFOL ;   Surgeon: Golda Claudis PENNER, MD;  [facility];  Service: Endoscopy;  Laterality: N/A;  200 ASA 1   ESOPHAGOGASTRODUODENOSCOPY (EGD) WITH PROPOFOL  N/A 03/29/2022   Procedure: ESOPHAGOGASTRODUODENOSCOPY (EGD) WITH PROPOFOL ;  Surgeon: Eartha Angelia Sieving, MD;  [facility];  Service: Gastroenterology;  Laterality: N/A;  2:00 pm, asa 1-2   GIVENS CAPSULE STUDY N/A 11/24/2020   Procedure: GIVENS CAPSULE STUDY;  Surgeon: Golda Claudis PENNER, MD;  [facility];  Service: Endoscopy;  Laterality: N/A;  7:30   IR ANGIOGRAM SELECTIVE EACH ADDITIONAL VESSEL  08/01/2018   IR ANGIOGRAM VISCERAL SELECTIVE  08/01/2018   IR EMBO ART  VEN HEMORR LYMPH EXTRAV  INC GUIDE ROADMAPPING  08/01/2018   IR IMAGING GUIDED PORT INSERTION  08/20/2018   IR PERC PLEURAL DRAIN W/INDWELL CATH W/IMG GUIDE  07/08/2018   IR THORACENTESIS ASP PLEURAL SPACE W/IMG GUIDE  07/07/2018   IR US  GUIDE VASC ACCESS RIGHT  08/01/2018   PLEURAL EFFUSION DRAINAGE Left 07/13/2018   Procedure: DRAINAGE OF LOCULATED PLEURAL EFFUSION;  Surgeon: Fleeta Hanford Coy, MD;  Location: [facility] OR;  Service: Thoracic;  Laterality: Left;   POLYPECTOMY  11/15/2020   Procedure: POLYPECTOMY;  Surgeon: Golda Claudis PENNER, MD;  [facility];  Service: Endoscopy;;   REMOVAL OF PLEURAL DRAINAGE CATHETER Left 08/20/2018   Procedure: REMOVAL OF PLEURAL DRAINAGE CATHETER;  Surgeon: Fleeta Hanford Coy, MD;  Location: [facility] OR;  Service: Thoracic;  Laterality: Left;   REMOVAL OF PLEURAL DRAINAGE CATHETER Left 08/20/2018   Procedure: REMOVAL OF PLEURAL DRAINAGE CATHETER;  Surgeon: Fleeta Hanford Coy, MD;  Location: [facility] OR;  Service: Thoracic;  Laterality: Left;   TALC  PLEURODESIS Left 07/13/2018   Procedure: Talc  Pleuradesis;  Surgeon: Fleeta Hanford, Coy, MD;  Location: [facility] OR;  Service: Thoracic;  Laterality: Left;   TOTAL HIP ARTHROPLASTY Left 06/05/2020   Procedure: LEFT TOTAL HIP ARTHROPLASTY ANTERIOR APPROACH;  Surgeon: Jerri Kay HERO, MD;  Location: [facility]  OR;  Service: Orthopedics;  Laterality: Left;  3-C   TUBAL LIGATION Bilateral    UTERINE ABLATION     VIDEO ASSISTED THORACOSCOPY Left 07/13/2018   Procedure: VIDEO ASSISTED THORACOSCOPY;  Surgeon: Fleeta Hanford, Coy, MD;  Location: [facility] OR;  Service: Thoracic;  Laterality: Left;     A IV Location/Drains/Wounds Patient Lines/Drains/Airways Status     Active Line/Drains/Airways     Name Placement date Placement time Site Days   Implanted Port 08/20/18 Right Chest 08/20/18  1335  Chest  1608            Intake/Output Last 24 hours No intake or output data in the 24 hours ending 01/14/23 2118  Labs/Imaging Results for orders placed or performed during the hospital encounter of 01/14/23 (from the past 48 hours)  CBC with Differential     Status: Abnormal   Collection Time: 01/14/23  2:20 PM  Result Value Ref Range   WBC 3.2 (L) 4.0 - 10.5 K/uL  RBC 3.20 (L) 3.87 - 5.11 MIL/uL   Hemoglobin 10.8 (L) 12.0 - 15.0 g/dL   HCT 66.0 (L) 63.9 - 53.9 %   MCV 105.9 (H) 80.0 - 100.0 fL   MCH 33.8 26.0 - 34.0 pg   MCHC 31.9 30.0 - 36.0 g/dL   RDW 83.2 (H) 88.4 - 84.4 %   Platelets 122 (L) 150 - 400 K/uL   nRBC 0.0 0.0 - 0.2 %   Neutrophils Relative % 78 %   Neutro Abs 2.5 1.7 - 7.7 K/uL   Lymphocytes Relative 17 %   Lymphs Abs 0.6 (L) 0.7 - 4.0 K/uL   Monocytes Relative 4 %   Monocytes Absolute 0.1 0.1 - 1.0 K/uL   Eosinophils Relative 0 %   Eosinophils Absolute 0.0 0.0 - 0.5 K/uL   Basophils Relative 0 %   Basophils Absolute 0.0 0.0 - 0.1 K/uL   Immature Granulocytes 1 %   Abs Immature Granulocytes 0.02 0.00 - 0.07 K/uL    Comment: Performed at [facility], [address], [place], [place] [address]  Comprehensive metabolic panel     Status: Abnormal   Collection Time: 01/14/23  2:20 PM  Result Value Ref Range   Sodium 127 (L) 135 - 145 mmol/L   Potassium 4.0 3.5 - 5.1 mmol/L   Chloride 91 (L) 98 - 111 mmol/L   CO2 27 22 - 32 mmol/L   Glucose, Bld 130 (H) 70 - 99 mg/dL     Comment: Glucose reference range applies only to samples taken after fasting for at least 8 hours.   BUN 19 6 - 20 mg/dL   Creatinine, Ser 9.20 0.44 - 1.00 mg/dL   Calcium  8.5 (L) 8.9 - 10.3 mg/dL   Total Protein 7.0 6.5 - 8.1 g/dL   Albumin  2.0 (L) 3.5 - 5.0 g/dL   AST 787 (H) 15 - 41 U/L   ALT 146 (H) 0 - 44 U/L   Alkaline Phosphatase 1,367 (H) 38 - 126 U/L   Total Bilirubin 0.7 0.0 - 1.2 mg/dL   GFR, Estimated >39 >39 mL/min    Comment: (NOTE) Calculated using the CKD-EPI Creatinine Equation (2021)    Anion gap 9 5 - 15    Comment: Performed at [facility], [address] [place], [place] [address]   *Note: Due to a large number of results and/or encounters for the requested time period, some results have not been displayed. A complete set of results can be found in Results Review.   US  Abdomen Limited Result Date: 01/14/2023 CLINICAL DATA:  Evaluate for ascites EXAM: LIMITED ABDOMEN ULTRASOUND FOR ASCITES TECHNIQUE: Limited ultrasound survey for ascites was performed in all four abdominal quadrants. COMPARISON:  CT abdomen pelvis 12/24/2022 FINDINGS: Large volume ascites demonstrated throughout all 4 quadrants. IMPRESSION: Large volume ascites. Electronically Signed   By: Bard Moats M.D.   On: 01/14/2023 15:42    Pending Labs Unresulted Labs (From admission, onward)    None       Vitals/Pain Today's Vitals   01/14/23 2000 01/14/23 2030 01/14/23 2049 01/14/23 2106  BP: 120/85   123/78  Pulse: 96 95  100  Resp: 15 16  20   Temp:      TempSrc:      SpO2: 93% 95%  96%  Weight:      Height:      PainSc:   10-Worst pain ever     Isolation Precautions No active isolations  Medications Medications  HYDROmorphone  (DILAUDID ) tablet 8  mg (8 mg Oral Given 01/14/23 1900)  furosemide  (LASIX ) tablet 20 mg (has no administration in time range)  diazepam  (VALIUM ) tablet 5 mg (has no administration in time range)  docusate sodium  (COLACE) capsule 200 mg (has no administration  in time range)  naloxegol  oxalate (MOVANTIK ) tablet 25 mg (has no administration in time range)  pantoprazole  (PROTONIX ) EC tablet 80 mg (80 mg Oral Given 01/14/23 1732)  ondansetron  (ZOFRAN -ODT) disintegrating tablet 4 mg (has no administration in time range)  gabapentin  (NEURONTIN ) capsule 300 mg (has no administration in time range)  albuterol  (PROVENTIL ) (2.5 MG/3ML) 0.083% nebulizer solution 3 mL (has no administration in time range)  phenylephrine -shark liver oil-mineral oil-petrolatum (PREPARATION H) rectal ointment 1 Application (has no administration in time range)  oxyCODONE  (OXYCONTIN ) 12 hr tablet 40 mg (40 mg Oral Given 01/14/23 1733)  heparin  injection 5,000 Units (5,000 Units Subcutaneous Not Given 01/14/23 1734)  sodium chloride  flush (NS) 0.9 % injection 3 mL (has no administration in time range)  sodium chloride  flush (NS) 0.9 % injection 3 mL (has no administration in time range)  0.9 %  sodium chloride  infusion (has no administration in time range)  HYDROmorphone  (DILAUDID ) injection 2 mg (has no administration in time range)  dicyclomine  (BENTYL ) capsule 20 mg (20 mg Oral Given 01/14/23 2104)  HYDROmorphone  (DILAUDID ) injection 1 mg (1 mg Intravenous Given 01/14/23 1353)  dicyclomine  (BENTYL ) injection 20 mg (20 mg Intramuscular Given 01/14/23 1353)  HYDROmorphone  (DILAUDID ) injection 1 mg (1 mg Intravenous Given 01/14/23 1524)  HYDROmorphone  (DILAUDID ) injection 1 mg (1 mg Intravenous Given 01/14/23 1639)  ondansetron  (ZOFRAN ) injection 4 mg (4 mg Intravenous Given 01/14/23 1655)  HYDROmorphone  (DILAUDID ) injection 2 mg (2 mg Intravenous Given 01/14/23 2050)    Mobility walks     Focused Assessments See provider note.   R Recommendations: See Admitting Provider Note  Report given to:   Additional Notes: Pt A&Ox4, ambulatory.

## 2023-01-14 NOTE — Assessment & Plan Note (Signed)
 Patient has been adequately controlled with Oxycodone 36 mg product 3-4 times a day plus dilaudid for breakthrough pain.  Plan Oxycontin 40 mg q8  Dilaudid for breakthru pain.

## 2023-01-14 NOTE — ED Provider Notes (Signed)
 [place] EMERGENCY DEPARTMENT AT [facility] Provider Note   CSN: [id number] Arrival date & time: 01/14/23  1305     History  Chief Complaint  Patient presents with   Shortness of Breath    Michelle Holt is a 52 y.o. female.  She presents to the ER for evaluation of shortness of breath and abdominal distention related to her malignant ascites.  She has stage IV ovarian cancer.  She is treated by Dr. Rogers, she went in today to have her infusion but was uncomfortable with her abdominal distention she was told she needed to delay her infusion and get a paracentesis.  No available will just have this done here at [facility] today.  Dr. Katragadda recommended transfer to have paracentesis.  Patient is requesting her Dilaudid  and Bentyl  for her chronic pain   Shortness of Breath      Home Medications Prior to Admission medications   Medication Sig Start Date End Date Taking? Authorizing Provider  albuterol  (VENTOLIN  HFA) 108 (90 Base) MCG/ACT inhaler Inhale 2 puffs into the lungs every 6 (six) hours as needed for wheezing or shortness of breath.    [provider]  Bioflavonoid Products (VITAMIN C) CHEW Chew 1 tablet by mouth daily.    [provider]  Cholecalciferol  (VITAMIN D3) 50 MCG (2000 UT) TABS Take 2,000 Units by mouth daily.    [provider]  Cyanocobalamin (B-12) 3000 MCG CAPS Take 3,000 mcg by mouth daily.    [provider]  diazepam  (VALIUM ) 5 MG tablet Take 1 tablet (5 mg total) by mouth at bedtime. 09/05/22   Rogers Hai, MD  dicyclomine  (BENTYL ) 20 MG tablet Take 1 tablet (20 mg total) by mouth every 6 (six) hours as needed for spasms. 01/07/23   Rogers Hai, MD  docusate sodium  (COLACE) 100 MG capsule Take 200 mg by mouth in the morning and at bedtime.    [provider]  furosemide  (LASIX ) 20 MG tablet Take 1 tablet (20 mg total) by mouth daily as needed. Patient taking differently:  Take 20 mg by mouth in the morning. 12/02/22   Rogers Hai, MD  gabapentin  (NEURONTIN ) 100 MG capsule TAKE THREE CAPSULES BY MOUTH DAILY AT BEDTIME 01/13/23   Rogers Hai, MD  HYDROmorphone  (DILAUDID ) 8 MG tablet Take 1 tablet (8 mg total) by mouth every 4 (four) hours as needed (Breakthrough Pain). 01/13/23   Rogers Hai, MD  Lido-PE-Glycerin-Petrolatum (PREPARATION H RAPID RELIEF) 5-0.25-14.4-15 % CREA Apply 1 application  topically every 6 (six) hours as needed (for hemorrhoids).    [provider]  magnesium  oxide (MAG-OX) 400 (240 Mg) MG tablet Take 1 tablet (400 mg total) by mouth 2 (two) times daily. 10/09/21   Lamon Pleasant HERO, PA-C  naloxegol  oxalate (MOVANTIK ) 25 MG TABS tablet Take 1 tablet (25 mg total) by mouth daily. 12/30/22   Marvine Almarie CROME, DO  naloxegol  oxalate (MOVANTIK ) 25 MG TABS tablet Take 1 tablet (25 mg total) by mouth daily. 12/30/22     omeprazole  (PRILOSEC) 40 MG capsule TAKE ONE CAPSULE BY MOUTH TWICE DAILY BEFORE MEALS Patient taking differently: Take 40 mg by mouth at bedtime. 04/29/22   Eartha Angelia Sieving, MD  ondansetron  (ZOFRAN ) 4 MG tablet Take 4 mg by mouth every 8 (eight) hours as needed. 01/12/23   [provider]  ondansetron  (ZOFRAN -ODT) 4 MG disintegrating tablet Take 1 tablet (4 mg total) by mouth every 8 (eight) hours as needed for nausea or vomiting. 01/01/23  Rogers Hai, MD  oxyCODONE  ER 36 MG C12A Take 1 capsule (36 mg total) by mouth 4 (four) times daily. 12/30/22   Drusilla Sabas RAMAN, MD  prochlorperazine  (COMPAZINE ) 10 MG tablet Take 10 mg by mouth 2 (two) times daily as needed for nausea or vomiting.    [provider]  rosuvastatin  (CRESTOR ) 5 MG tablet Take 5 mg by mouth at bedtime. 10/05/22   [provider]  venlafaxine  XR (EFFEXOR -XR) 150 MG 24 hr capsule Take 150 mg by mouth at bedtime. 10/03/22   [provider]      Allergies    Morphine  and codeine,  Nickel, Nortriptyline , Topamax  [topiramate ], Xanax  [alprazolam ], Actifed cold-allergy [chlorpheniramine-phenyleph er], Amoxicillin, Codeine, Erythromycin, Penicillins, Red dye, and Sudafed [pseudoephedrine hcl]    Review of Systems   Review of Systems  Respiratory:  Positive for shortness of breath.     Physical Exam Updated Vital Signs BP 121/79   Pulse 96   Temp 97.6 F (36.4 C) (Oral)   Resp 18   Ht 5' 4 (1.626 m)   Wt 90.7 kg   LMP  (LMP Unknown)   SpO2 96%   BMI 34.33 kg/m  Physical Exam Vitals and nursing note reviewed.  Constitutional:      General: She is not in acute distress.    Appearance: She is well-developed.  HENT:     Head: Normocephalic and atraumatic.  Eyes:     Conjunctiva/sclera: Conjunctivae normal.  Cardiovascular:     Rate and Rhythm: Normal rate and regular rhythm.     Heart sounds: No murmur heard. Pulmonary:     Effort: Pulmonary effort is normal. No respiratory distress.     Breath sounds: Normal breath sounds.  Abdominal:     General: There is distension.     Palpations: Abdomen is soft.     Tenderness: There is no abdominal tenderness.     Comments: + fluid wave  Musculoskeletal:        General: No swelling.     Cervical back: Neck supple.  Skin:    General: Skin is warm and dry.     Capillary Refill: Capillary refill takes less than 2 seconds.  Neurological:     Mental Status: She is alert.  Psychiatric:        Mood and Affect: Mood normal.     ED Results / Procedures / Treatments   Labs (all labs ordered are listed, but only abnormal results are displayed) Labs Reviewed  CBC WITH DIFFERENTIAL/PLATELET - Abnormal; Notable for the following components:      Result Value   WBC 3.2 (*)    RBC 3.20 (*)    Hemoglobin 10.8 (*)    HCT 33.9 (*)    MCV 105.9 (*)    RDW 16.7 (*)    Platelets 122 (*)    Lymphs Abs 0.6 (*)    All other components within normal limits  COMPREHENSIVE METABOLIC PANEL - Abnormal; Notable for the  following components:   Sodium 127 (*)    Chloride 91 (*)    Glucose, Bld 130 (*)    Calcium  8.5 (*)    Albumin  2.0 (*)    AST 212 (*)    ALT 146 (*)    Alkaline Phosphatase 1,367 (*)    All other components within normal limits    EKG None  Radiology No results found.  Procedures Procedures    Medications Ordered in ED Medications  HYDROmorphone  (DILAUDID ) injection 1 mg (1 mg  Intravenous Given 01/14/23 1353)  dicyclomine  (BENTYL ) injection 20 mg (20 mg Intramuscular Given 01/14/23 1353)  HYDROmorphone  (DILAUDID ) injection 1 mg (1 mg Intravenous Given 01/14/23 1524)    ED Course/ Medical Decision Making/ A&P Clinical Course as of 01/14/23 1531  Tue Jan 14, 2023  1413 Patient here for paracentesis, have abdominal distention this causing her to be in pain and have increased shortness of breath.  This is secondary to malignant ascites from her ovarian cancer.  Sent here for paracentesis from Dr. Rogers, discussed with Dr. Rogers patient would likely need to be transferred for paracentesis.  I consulted with Dr. Luverne at [facility] who states they do not have capacity at this time and to reach out to [facility].  I discussed with Dr. Katha at [facility], unless patient could be immediately transferred or even come by private vehicle to radiology they will not be able to get it done today.  Suggested either ED paracentesis or admit to observation overnight and they will do it at [facility] in the morning.  Discussed with ED attending Dr. Neysa.  Is not feel comfortable doing this paracentesis at this time, feels it would be safer for patient to have this be done by radiology.  I am awaiting patient's labs and we will plan on discussing with hospitalist for observation at [facility] for procedure in the morning [CB]    Clinical Course User Index [CB] Suellen Sherran LABOR, PA-C                                 Medical Decision Making This patient presents to the ED for concern of  ascites, this involves an extensive number of treatment options, and is a complaint that carries with it a high risk of complications and morbidity.  The differential diagnosis includes malignant ascites, SBP, other   Co morbidities that complicate the patient evaluation :   stage IV ovarian cancer   Additional history obtained:  Additional history obtained from EMR External records from outside source obtained and reviewed including prior notes   Lab Tests:  I Ordered, and personally interpreted labs.  The pertinent results include: Sodium 127 which is around her baseline, LFTs elevated   Imaging Studies ordered:  I ordered imaging studies including send abdomen which shows a's I independently visualized and interpreted imaging within scope of identifying emergent findings  Radiology interpretation still pending    Consultations Obtained:  I requested consultation with the Interventional radiologist, Dr. Katha,  and discussed lab and imaging findings as well as pertinent plan - they recommend: admission to [facility] for IR procedure tomorrow. I consulted with the hospitalist Dr. Harlow for admission to [facility] so patient can have procedure tomorrow   Problem List / ED Course / Critical interventions / Medication management  Ligament ascites-patient missed her last paracentesis them has been short of breath due to the swelling and is uncomfortable, unfortunately no radiologist available here at [facility] until Friday so patient will be transferred to [facility] to have this procedure done tomorrow.  Given Dilaudid  which she takes at home for pain.  Baseline pancytopenia, baseline hyponatremia, elevated LFTs I ordered medication including dialudid  for chronic pain  Reevaluation of the patient after these medicines showed that the patient improved I have reviewed the patients home medicines and have made adjustments as needed       Amount and/or Complexity of Data  Reviewed Labs: ordered.  Risk Prescription drug management. Decision regarding hospitalization.           Final Clinical Impression(s) / ED Diagnoses Final diagnoses:  Malignant ascites    Rx / DC Orders ED Discharge Orders     None         Suellen Sherran LABOR, PA-C 01/14/23 1531    Neysa Caron PARAS, DO 01/14/23 1612

## 2023-01-15 ENCOUNTER — Other Ambulatory Visit: Payer: Self-pay

## 2023-01-15 ENCOUNTER — Observation Stay (HOSPITAL_COMMUNITY): Payer: 59

## 2023-01-15 DIAGNOSIS — Z5111 Encounter for antineoplastic chemotherapy: Secondary | ICD-10-CM | POA: Diagnosis not present

## 2023-01-15 DIAGNOSIS — C563 Malignant neoplasm of bilateral ovaries: Secondary | ICD-10-CM | POA: Insufficient documentation

## 2023-01-15 DIAGNOSIS — R180 Malignant ascites: Secondary | ICD-10-CM | POA: Diagnosis not present

## 2023-01-15 MED ORDER — LIDOCAINE HCL (PF) 1 % IJ SOLN
10.0000 mL | Freq: Once | INTRAMUSCULAR | Status: AC
  Administered 2023-01-15: 10 mL via INTRADERMAL

## 2023-01-15 MED ORDER — CHLORHEXIDINE GLUCONATE CLOTH 2 % EX PADS
6.0000 | MEDICATED_PAD | Freq: Every day | CUTANEOUS | Status: DC
  Administered 2023-01-15: 6 via TOPICAL

## 2023-01-15 MED ORDER — HEPARIN SOD (PORK) LOCK FLUSH 100 UNIT/ML IV SOLN
500.0000 [IU] | INTRAVENOUS | Status: AC | PRN
  Administered 2023-01-15: 500 [IU]

## 2023-01-15 NOTE — Procedures (Signed)
 PROCEDURE SUMMARY:  Successful US guided paracentesis from right abdomen.  Yielded 4.7 L of clear yellow fluid.  No immediate complications.  Pt tolerated well.   Specimen not sent for labs.  EBL < 2 mL  Mickie Kay, NP 01/15/2023 10:13 AM

## 2023-01-15 NOTE — Progress Notes (Signed)
 Pt arrived to the unit via [facility]. Pt alert and oriented x4. Able to walk from stretcher to bed. Pt oriented to room and call bell within reach. VSS. No distress noted. Admission notified about pt's arrival. Dr. Joneen Roach made aware.

## 2023-01-15 NOTE — Plan of Care (Signed)
  Problem: Education: Goal: Knowledge of General Education information will improve Description: Including pain rating scale, medication(s)/side effects and non-pharmacologic comfort measures Outcome: Progressing   Problem: Clinical Measurements: Goal: Ability to maintain clinical measurements within normal limits will improve Outcome: Progressing Goal: Will remain free from infection Outcome: Progressing Goal: Diagnostic test results will improve Outcome: Progressing Goal: Respiratory complications will improve Outcome: Progressing Goal: Cardiovascular complication will be avoided Outcome: Progressing   Problem: Activity: Goal: Risk for activity intolerance will decrease Outcome: Progressing   Problem: Nutrition: Goal: Adequate nutrition will be maintained Outcome: Progressing   Problem: Elimination: Goal: Will not experience complications related to bowel motility Outcome: Progressing Goal: Will not experience complications related to urinary retention Outcome: Progressing   Problem: Pain Management: Goal: General experience of comfort will improve Outcome: Progressing   Problem: Safety: Goal: Ability to remain free from injury will improve Outcome: Progressing   Problem: Skin Integrity: Goal: Risk for impaired skin integrity will decrease Outcome: Progressing

## 2023-01-15 NOTE — Discharge Summary (Signed)
 Physician Discharge Summary   Patient: Michelle Holt MRN: [id number] DOB: [birth year]  Admit date:     01/14/2023  Discharge date: 01/15/23  Discharge Physician: Bernardino KATHEE Come   PCP: Rosamond Leta KATHEE, MD   Recommendations at discharge:  Follow up with Dr. Katragadda as scheduled 1/14 Follow up at [facility] for repeat therapeutic paracentesis 01/17/2023. Note 4.7L paracentesis performed 01/15/23 as inpatient.    Discharge Diagnoses: Principal Problem:   Ascites, malignant Active Problems:   Cancer associated pain   Ovarian cancer, bilateral (HCC)   Palliative care patient   GERD (gastroesophageal reflux disease)  Hospital Course: Michelle Holt, a 53 y/o with multiple chronic medical problems has stage 4 serous ovarian cancer with peritoneal and pleural implants who was at the Cancer center for treatment. She was noted to have massive ascites that were causing pain and compromising respirations. She was referred by Dr. MARLA to [facility] for paracentesis.     ED Course: T 97.6  121/79  HR 96  RR 18. Patient seems relatively comfortable. She is aware of her diagnosis and prognosis. Lab: Na 127, Glucose 130, Alk phos 1,367  Alb 2.0, AST 212, ALT 146, CBC with Hgb 10,8, WBC 3.2. Abd U/S - ascites all 4 quadrants. TRH called to admit to [facility] for paracentensis 01/15/23  On 1/1, IR performed paracentesis of 4.7L clear yellow fluid without complications and improvement in patient's symptoms. She remains hemodynamically stable and amenable to discharge with plans to follow up with repeat U/S in 48 hours.  Assessment and Plan: * Ascites, malignant Patient with stage 4 serous ovarian cancer with mets now with massive ascites compromising respirations. Paracentesis cannot be performed at [facility] until Friday. Patient was transferred to [facility] for paracentesis by IR, performed without complication 1/1.     Cancer associated pain: Continue home Tx  Dilaudid  for breakthru pain.   Palliative care  patient Patient has seen palliative care in the past. She is aware of her prognosis.  Ovarian cancer, bilateral (HCC) Per Dr. MARLA at [facility]. She has had one round of chemo. This is her third recurrence of ovarian CA and she is aware of her prognosis.  Plan Per Dr. MARLA as to next round of chemo   GERD: Continue PPI  Consultants: IR Procedures performed: Paracentesis 01/15/23  Disposition: Home Diet recommendation: As tolerated DISCHARGE MEDICATION: Allergies as of 01/15/2023       Reactions   Morphine  And Codeine Itching   Nickel Itching   Nortriptyline  Other (See Comments)   Significant weight gain   Topamax  [topiramate ] Diarrhea, Nausea Only   Xanax  [alprazolam ] Other (See Comments)   Can't wake up   Actifed Cold-allergy [chlorpheniramine-phenyleph Er] Rash, Other (See Comments)   Red dye only   Amoxicillin Rash   Codeine Hives   Erythromycin Rash   Penicillins Rash      Red Dye Rash   Sudafed [pseudoephedrine Hcl] Rash, Other (See Comments)   Red dye only        Medication List     TAKE these medications    albuterol  108 (90 Base) MCG/ACT inhaler Commonly known as: VENTOLIN  HFA Inhale 2 puffs into the lungs every 6 (six) hours as needed for wheezing or shortness of breath.   diazepam  5 MG tablet Commonly known as: VALIUM  Take 1 tablet (5 mg total) by mouth at bedtime.   dicyclomine  20 MG tablet Commonly known as: BENTYL  Take 1 tablet (20 mg total) by mouth every 6 (six)  hours as needed for spasms.   gabapentin  100 MG capsule Commonly known as: NEURONTIN  TAKE THREE CAPSULES BY MOUTH DAILY AT BEDTIME What changed: See the new instructions.   HYDROmorphone  8 MG tablet Commonly known as: DILAUDID  Take 1 tablet (8 mg total) by mouth every 4 (four) hours as needed (Breakthrough Pain).   naloxegol  oxalate 25 MG Tabs tablet Commonly known as: Movantik  Take 1 tablet (25 mg total) by mouth daily. What changed: Another medication with the same name was  removed. Continue taking this medication, and follow the directions you see here.   ondansetron  4 MG disintegrating tablet Commonly known as: ZOFRAN -ODT Take 1 tablet (4 mg total) by mouth every 8 (eight) hours as needed for nausea or vomiting.   Xtampza  ER 36 MG C12a Generic drug: oxyCODONE  ER Take 1 capsule (36 mg total) by mouth 4 (four) times daily.        Follow-up Information     Vyas, Leta NOVAK, MD Follow up.   Specialty: Internal Medicine Contact information: [address] [place] [address] [phone]         Rogers Hai, MD Follow up.   Specialty: Hematology Contact information: [address] [place] [address] [phone]                Discharge Exam: Fredricka Weights   01/14/23 1300  Weight: 90.7 kg  BP (!) 116/92 (BP Location: Right Arm)   Pulse (!) 103   Temp 98 F (36.7 C) (Oral)   Resp 17   Ht 5' 4 (1.626 m)   Wt 90.7 kg   LMP  (LMP Unknown)   SpO2 98%   BMI 34.33 kg/m   Pleasant chronically ill-appearing female in no distress Nonlabored, clear RRR, no MRG.  Abdomen is softly distended without rebound or guarding, +BS  Condition at discharge: stable  The results of significant diagnostics from this hospitalization (including imaging, microbiology, ancillary and laboratory) are listed below for reference.   Imaging Studies: US  Paracentesis Result Date: 01/15/2023 INDICATION: Patient with a history of metastatic ovarian cancer with recurrent malignant ascites. Interventional radiology asked to perform a therapeutic paracentesis. EXAM: ULTRASOUND GUIDED PARACENTESIS MEDICATIONS: 1% lidocaine  10 mL COMPLICATIONS: None immediate. PROCEDURE: Informed written consent was obtained from the patient after a discussion of the risks, benefits and alternatives to treatment. A timeout was performed prior to the initiation of the procedure. Initial ultrasound scanning demonstrates a large amount of ascites within the right lower abdominal  quadrant. The right lower abdomen was prepped and draped in the usual sterile fashion. 1% lidocaine  was used for local anesthesia. Following this, a 19 gauge, 7-cm, Yueh catheter was introduced. An ultrasound image was saved for documentation purposes. The paracentesis was performed. After approximately 2 L of fluid removal the catheter stopped draining. Ultrasound imaging demonstrated an adequate pocket of residual fluid. The site was re-prepped with chlorhexidine  and a 10 cm Yueh was inserted using sterile technique. The paracentesis was performed. The catheter was removed and a dressing was applied. The patient tolerated the procedure well without immediate post procedural complication. FINDINGS: A total of approximately 4.7 L of clear yellow fluid was removed. IMPRESSION: Successful ultrasound-guided paracentesis yielding 4.7 liters of peritoneal fluid. Procedure performed by Warren Dais NP Electronically Signed   By: Juliene Balder M.D.   On: 01/15/2023 10:52   US  Abdomen Limited Result Date: 01/14/2023 CLINICAL DATA:  Evaluate for ascites EXAM: LIMITED ABDOMEN ULTRASOUND FOR ASCITES TECHNIQUE: Limited ultrasound survey for ascites was  performed in all four abdominal quadrants. COMPARISON:  CT abdomen pelvis 12/24/2022 FINDINGS: Large volume ascites demonstrated throughout all 4 quadrants. IMPRESSION: Large volume ascites. Electronically Signed   By: Bard Moats M.D.   On: 01/14/2023 15:42   US  Paracentesis Result Date: 12/25/2022 INDICATION: Patient with a history of ovarian cancer with recurrent ascites. Interventional radiology asked to perform a therapeutic paracentesis. EXAM: ULTRASOUND GUIDED PARACENTESIS MEDICATIONS: 1% lidocaine  10 mL COMPLICATIONS: None immediate. PROCEDURE: Informed written consent was obtained from the patient after a discussion of the risks, benefits and alternatives to treatment. A timeout was performed prior to the initiation of the procedure. Initial ultrasound scanning  demonstrates a large amount of ascites within the right lower abdominal quadrant. The right lower abdomen was prepped and draped in the usual sterile fashion. 1% lidocaine  was used for local anesthesia. Following this, a 19 gauge, 7-cm, Yueh catheter was introduced. An ultrasound image was saved for documentation purposes. The paracentesis was performed. The catheter was removed and a dressing was applied. The patient tolerated the procedure well without immediate post procedural complication. FINDINGS: A total of approximately 2 L of clear yellow fluid was removed. IMPRESSION: Successful ultrasound-guided paracentesis yielding 2 liters of peritoneal fluid. Procedure performed by Warren Dais NP Electronically Signed   By: Ami Bellman D.O.   On: 12/25/2022 12:20   CT ABDOMEN PELVIS W CONTRAST Result Date: 12/24/2022 CLINICAL DATA:  Abdominal pain for 2 weeks, stage IV ovarian cancer EXAM: CT ABDOMEN AND PELVIS WITH CONTRAST TECHNIQUE: Multidetector CT imaging of the abdomen and pelvis was performed using the standard protocol following bolus administration of intravenous contrast. RADIATION DOSE REDUCTION: This exam was performed according to the departmental dose-optimization program which includes automated exposure control, adjustment of the mA and/or kV according to patient size and/or use of iterative reconstruction technique. CONTRAST:  OMNIPAQUE  IOHEXOL  300 MG/ML  SOLN COMPARISON:  12/15/2022 FINDINGS: Lower chest: No acute pleural or parenchymal lung disease. Hepatobiliary: No focal liver abnormality is seen. Status post cholecystectomy. No biliary dilatation. Pancreas: Unremarkable. No pancreatic ductal dilatation or surrounding inflammatory changes. Spleen: Stable complex and partially calcified splenic cyst. Spleen is stable. Adrenals/Urinary Tract: Adrenal glands are unremarkable. Kidneys are normal, without renal calculi, focal lesion, or hydronephrosis. Bladder is decompressed.  Stomach/Bowel: No bowel obstruction or ileus.  Stable hiatal hernia. Vascular/Lymphatic: Stable aortic atherosclerosis. No other significant vascular finding. No discrete adenopathy within the abdomen or pelvis. Reproductive: Prior hysterectomy.  No adnexal mass. Other: Marked progression of the peritoneal carcinomatosis seen previously, with diffuse mesenteric nodules and increasing complex ascites throughout the abdomen and pelvis. No free intraperitoneal gas. No abdominal wall hernia. Musculoskeletal: Left hip arthroplasty. No acute or destructive bony abnormalities. Reconstructed images demonstrate no additional findings. IMPRESSION: 1. Worsening diffuse peritoneal carcinomatosis, with large volume complex ascites throughout the abdomen and pelvis increased since prior study. Findings are consistent with progressive metastatic ovarian cancer. 2. No bowel obstruction or ileus.  Stable hiatal hernia. 3.  Aortic Atherosclerosis (ICD10-I70.0). Electronically Signed   By: Ozell Daring M.D.   On: 12/24/2022 19:14    Microbiology: Results for orders placed or performed in visit on 08/02/21  Microscopic Examination     Status: Abnormal   Collection Time: 08/02/21 10:36 AM   Urine  Result Value Ref Range Status   WBC, UA 0-5 0 - 5 /hpf Final   RBC, Urine 3-10 (A) 0 - 2 /hpf Final   Epithelial Cells (non renal) 0-10 0 - 10 /hpf Final  Renal Epithel, UA None seen None seen /hpf Final   Mucus, UA Present Not Estab. Final   Bacteria, UA Few None seen/Few Final   *Note: Due to a large number of results and/or encounters for the requested time period, some results have not been displayed. A complete set of results can be found in Results Review.    Labs: CBC: Recent Labs  Lab 01/14/23 1058 01/14/23 1420  WBC 3.3* 3.2*  NEUTROABS 2.5 2.5  HGB 11.3* 10.8*  HCT 34.2* 33.9*  MCV 104.6* 105.9*  PLT 119* 122*   Basic Metabolic Panel: Recent Labs  Lab 01/14/23 1058 01/14/23 1420  NA 128* 127*   K 4.1 4.0  CL 92* 91*  CO2 24 27  GLUCOSE 131* 130*  BUN 20 19  CREATININE 0.94 0.79  CALCIUM  8.9 8.5*  MG 1.7  --    Liver Function Tests: Recent Labs  Lab 01/14/23 1058 01/14/23 1420  AST 238* 212*  ALT 158* 146*  ALKPHOS 1,505* 1,367*  BILITOT 0.7 0.7  PROT 7.3 7.0  ALBUMIN  2.2* 2.0*   CBG: No results for input(s): GLUCAP in the last 168 hours.  Discharge time spent: greater than 30 minutes.  Signed: Bernardino KATHEE Come, MD [facility] 01/15/2023

## 2023-01-15 NOTE — Plan of Care (Signed)
 Patient alert/oriented X4. Patient went down for paracentesis today and dilaudid  administered as needed for abdominal pain following procedure. IV team consult ordered for removal of chest port prior to discharge. VSS, and AVS discharge instructions explained to patient. Personal belongings are packed at bedside.  Problem: Education: Goal: Knowledge of General Education information will improve Description: Including pain rating scale, medication(s)/side effects and non-pharmacologic comfort measures Outcome: Adequate for Discharge   Problem: Health Behavior/Discharge Planning: Goal: Ability to manage health-related needs will improve Outcome: Adequate for Discharge   Problem: Clinical Measurements: Goal: Ability to maintain clinical measurements within normal limits will improve Outcome: Adequate for Discharge   Problem: Clinical Measurements: Goal: Will remain free from infection Outcome: Adequate for Discharge   Problem: Clinical Measurements: Goal: Diagnostic test results will improve Outcome: Adequate for Discharge   Problem: Clinical Measurements: Goal: Respiratory complications will improve Outcome: Adequate for Discharge   Problem: Clinical Measurements: Goal: Cardiovascular complication will be avoided Outcome: Adequate for Discharge   Problem: Activity: Goal: Risk for activity intolerance will decrease Outcome: Adequate for Discharge   Problem: Nutrition: Goal: Adequate nutrition will be maintained Outcome: Adequate for Discharge   Problem: Coping: Goal: Level of anxiety will decrease Outcome: Adequate for Discharge   Problem: Elimination: Goal: Will not experience complications related to bowel motility Outcome: Adequate for Discharge   Problem: Elimination: Goal: Will not experience complications related to urinary retention Outcome: Adequate for Discharge   Problem: Pain Management: Goal: General experience of comfort will improve Outcome: Adequate  for Discharge   Problem: Safety: Goal: Ability to remain free from injury will improve Outcome: Adequate for Discharge   Problem: Skin Integrity: Goal: Risk for impaired skin integrity will decrease Outcome: Adequate for Discharge

## 2023-01-15 NOTE — TOC Transition Note (Signed)
 Transition of Care [facility]) - Discharge Note   Patient Details  Name: Michelle Holt MRN: [id number] Date of Birth: October 21, 1970  [facility] CM/SW Contact:  Roxie KANDICE Stain, RN Phone Number: 01/15/2023, 12:48 PM   Clinical Narrative:    Patient stable for discharge.  No TOC needs at this time.    Final next level of care: Home/Self Care Barriers to Discharge: Barriers Resolved   Patient Goals and CMS Choice Patient states their goals for this hospitalization and ongoing recovery are:: return home          Discharge Placement               home        Discharge Plan and Services Additional resources added to the After Visit Summary for                                       Social Drivers of Health (SDOH) Interventions SDOH Screenings   Food Insecurity: No Food Insecurity (01/15/2023)  Housing: Low Risk  (01/15/2023)  Transportation Needs: No Transportation Needs (01/15/2023)  Utilities: Not At Risk (01/15/2023)  Depression (PHQ2-9): High Risk (03/11/2022)  Financial Resource Strain: Low Risk  (08/26/2022)   Received from [facility]  Physical Activity: Inactive (07/22/2018)  Social Connections: Moderately Isolated (07/22/2018)  Stress: Stress Concern Present (07/22/2018)  Tobacco Use: High Risk (01/14/2023)     Readmission Risk Interventions     No data to display

## 2023-01-16 ENCOUNTER — Inpatient Hospital Stay: Payer: 59

## 2023-01-16 ENCOUNTER — Inpatient Hospital Stay: Payer: 59 | Admitting: Hematology

## 2023-01-16 ENCOUNTER — Other Ambulatory Visit: Payer: Self-pay | Admitting: *Deleted

## 2023-01-16 ENCOUNTER — Ambulatory Visit: Payer: 59

## 2023-01-16 MED ORDER — MAGIC MOUTHWASH W/LIDOCAINE
5.0000 mL | Freq: Four times a day (QID) | ORAL | 1 refills | Status: DC | PRN
Start: 2023-01-16 — End: 2023-03-08

## 2023-01-16 MED ORDER — MAGIC MOUTHWASH W/LIDOCAINE: 5.0000 mL | Freq: Four times a day (QID) | ORAL | 1 refills | Status: DC | PRN

## 2023-01-16 NOTE — Telephone Encounter (Signed)
 Appropriate to send magic MW swish and swallow for this?

## 2023-01-17 ENCOUNTER — Encounter (HOSPITAL_COMMUNITY): Payer: Self-pay

## 2023-01-17 ENCOUNTER — Inpatient Hospital Stay: Payer: 59

## 2023-01-17 ENCOUNTER — Inpatient Hospital Stay: Payer: 59 | Attending: Hematology

## 2023-01-17 ENCOUNTER — Ambulatory Visit (HOSPITAL_COMMUNITY)
Admission: RE | Admit: 2023-01-17 | Discharge: 2023-01-17 | Disposition: A | Discharge status: Home / Self Care | Payer: 59 | Source: Ambulatory Visit | Attending: Hematology | Admitting: Hematology

## 2023-01-17 VITALS — BP 106/52 | HR 92 | Temp 97.5°F | Resp 18 | Wt 184.8 lb

## 2023-01-17 DIAGNOSIS — C799 Secondary malignant neoplasm of unspecified site: Secondary | ICD-10-CM | POA: Insufficient documentation

## 2023-01-17 DIAGNOSIS — Z5111 Encounter for antineoplastic chemotherapy: Secondary | ICD-10-CM | POA: Diagnosis not present

## 2023-01-17 DIAGNOSIS — C569 Malignant neoplasm of unspecified ovary: Secondary | ICD-10-CM | POA: Insufficient documentation

## 2023-01-17 DIAGNOSIS — C563 Malignant neoplasm of bilateral ovaries: Secondary | ICD-10-CM

## 2023-01-17 DIAGNOSIS — R180 Malignant ascites: Secondary | ICD-10-CM | POA: Insufficient documentation

## 2023-01-17 DIAGNOSIS — Z95828 Presence of other vascular implants and grafts: Secondary | ICD-10-CM

## 2023-01-17 DIAGNOSIS — R188 Other ascites: Secondary | ICD-10-CM | POA: Diagnosis not present

## 2023-01-17 LAB — CBC WITH DIFFERENTIAL/PLATELET
Abs Immature Granulocytes: 0.02 10*3/uL (ref 0.00–0.07)
Basophils Absolute: 0 10*3/uL (ref 0.0–0.1)
Basophils Relative: 0 %
Eosinophils Absolute: 0 10*3/uL (ref 0.0–0.5)
Eosinophils Relative: 0 %
HCT: 34.5 % — ABNORMAL LOW (ref 36.0–46.0)
Hemoglobin: 11.3 g/dL — ABNORMAL LOW (ref 12.0–15.0)
Immature Granulocytes: 1 %
Lymphocytes Relative: 14 %
Lymphs Abs: 0.3 10*3/uL — ABNORMAL LOW (ref 0.7–4.0)
MCH: 33.8 pg (ref 26.0–34.0)
MCHC: 32.8 g/dL (ref 30.0–36.0)
MCV: 103.3 fL — ABNORMAL HIGH (ref 80.0–100.0)
Monocytes Absolute: 0.4 10*3/uL (ref 0.1–1.0)
Monocytes Relative: 17 %
Neutro Abs: 1.7 10*3/uL (ref 1.7–7.7)
Neutrophils Relative %: 68 %
Platelets: 72 10*3/uL — ABNORMAL LOW (ref 150–400)
RBC: 3.34 MIL/uL — ABNORMAL LOW (ref 3.87–5.11)
RDW: 16.6 % — ABNORMAL HIGH (ref 11.5–15.5)
WBC: 2.4 10*3/uL — ABNORMAL LOW (ref 4.0–10.5)
nRBC: 2.9 % — ABNORMAL HIGH (ref 0.0–0.2)

## 2023-01-17 LAB — COMPREHENSIVE METABOLIC PANEL
ALT: 193 U/L — ABNORMAL HIGH (ref 0–44)
AST: 341 U/L — ABNORMAL HIGH (ref 15–41)
Albumin: 2.1 g/dL — ABNORMAL LOW (ref 3.5–5.0)
Alkaline Phosphatase: 1577 U/L — ABNORMAL HIGH (ref 38–126)
Anion gap: 12 (ref 5–15)
BUN: 17 mg/dL (ref 6–20)
CO2: 26 mmol/L (ref 22–32)
Calcium: 8.4 mg/dL — ABNORMAL LOW (ref 8.9–10.3)
Chloride: 89 mmol/L — ABNORMAL LOW (ref 98–111)
Creatinine, Ser: 0.73 mg/dL (ref 0.44–1.00)
GFR, Estimated: >60 mL/min (ref >60)
Glucose, Bld: 91 mg/dL (ref 70–99)
Potassium: 4.1 mmol/L (ref 3.5–5.1)
Sodium: 127 mmol/L — ABNORMAL LOW (ref 135–145)
Total Bilirubin: 1.2 mg/dL (ref 0.0–1.2)
Total Protein: 7 g/dL (ref 6.5–8.1)

## 2023-01-17 LAB — MAGNESIUM: Magnesium: 1.6 mg/dL — ABNORMAL LOW (ref 1.7–2.4)

## 2023-01-17 MED ORDER — HEPARIN SOD (PORK) LOCK FLUSH 100 UNIT/ML IV SOLN
500.0000 [IU] | Freq: Once | INTRAVENOUS | Status: AC | PRN
  Administered 2023-01-17: 500 [IU]

## 2023-01-17 MED ORDER — ALBUMIN HUMAN 25 % IV SOLN
50.0000 g | Freq: Once | INTRAVENOUS | Status: AC
  Administered 2023-01-17: 50 g via INTRAVENOUS

## 2023-01-17 MED ORDER — PROCHLORPERAZINE MALEATE 10 MG PO TABS
10.0000 mg | ORAL_TABLET | Freq: Once | ORAL | Status: AC
Start: 2023-01-17 — End: 2023-01-17
  Administered 2023-01-17: 10 mg via ORAL
  Filled 2023-01-17: qty 1

## 2023-01-17 MED ORDER — MAGNESIUM SULFATE 2 GM/50ML IV SOLN
2.0000 g | Freq: Once | INTRAVENOUS | Status: AC
  Administered 2023-01-17: 2 g via INTRAVENOUS
  Filled 2023-01-17: qty 50

## 2023-01-17 MED ORDER — SODIUM CHLORIDE 0.9% FLUSH
10.0000 mL | INTRAVENOUS | Status: DC | PRN
  Administered 2023-01-17: 10 mL

## 2023-01-17 MED ORDER — SODIUM CHLORIDE 0.9% FLUSH
10.0000 mL | INTRAVENOUS | Status: DC | PRN
  Administered 2023-01-17: 10 mL via INTRAVENOUS

## 2023-01-17 MED ORDER — GEMCITABINE HCL CHEMO INJECTION 1 GM/26.3ML
600.0000 mg/m2 | Freq: Once | INTRAVENOUS | Status: AC
  Administered 2023-01-17: 1140 mg via INTRAVENOUS
  Filled 2023-01-17: qty 29.98

## 2023-01-17 MED ORDER — ALBUMIN HUMAN 25 % IV SOLN
INTRAVENOUS | Status: AC
  Filled 2023-01-17: qty 200

## 2023-01-17 MED ORDER — SODIUM CHLORIDE 0.9 % IV SOLN: INTRAVENOUS | Status: DC

## 2023-01-17 MED ORDER — DEXAMETHASONE 4 MG PO TABS: 4.0000 mg | ORAL_TABLET | Freq: Every day | ORAL | 0 refills | Status: DC

## 2023-01-17 MED ORDER — OMEPRAZOLE 40 MG PO CPDR: 40.0000 mg | DELAYED_RELEASE_CAPSULE | Freq: Every day | ORAL | 0 refills | Status: DC

## 2023-01-17 NOTE — Procedures (Signed)
 PROCEDURE SUMMARY:  Successful image-guided paracentesis from the right lower abdomen.  Yielded 1.7 liters of clear yellow fluid.  No immediate complications.  EBL < 1 mL Patient tolerated well.   Specimen was not sent for labs.  Please see imaging section of Epic for full dictation.  Clotilda DELENA Hesselbach PA-C 01/17/2023 1:22 PM

## 2023-01-17 NOTE — Progress Notes (Signed)
 Patients port flushed without difficulty.  Good blood return noted with no bruising or swelling noted at site.  Patient remains accessed for treatment.

## 2023-01-17 NOTE — Patient Instructions (Signed)

## 2023-01-17 NOTE — Progress Notes (Signed)
 Patient presents today post Paracenthesis AST 341 ALT 193 Platelets 72 Mag 1.6. Patient states she is unable to keep anything down, is throwing up white foamlike substance. Dr. Rogers made aware of patient's labs and symptoms, Per Dr. Rogers patient okay for treatment. 2g of Magnesium  given per standing orders.  1520 Patient called out and states she was feeling flushed, Gemzar  stopped. Patient's face appears red. Vitals within normal limits. Normal saline bolus started. Dr. Katragadda called and made aware. States to not restart Gemzar  and once patient feels like she is no longer flush she is okay to go.

## 2023-01-17 NOTE — Progress Notes (Signed)
 Patient tolerated right sided paracentesis procedure and 50G of IV albumin  well today and 1.7 Liters of peritoneal ascites removed. Patient verbalized understanding of discharge instructions and left via wheelchair with her friend in no acute distress to go back to the cancer center for treatment this evening. Portacath intact and flushed after albumin  administrations with good blood return noted.

## 2023-01-17 NOTE — Progress Notes (Signed)
 Hypersensitivity Reaction note  Date of event: 01/17/23 Time of event: 1517 Generic name of drug involved: Gemcitabine  Gemzar  Name of provider notified of the hypersensitivity reaction: Dr. Katragadda Was agent that likely caused hypersensitivity reaction added to Allergies List within EMR? Yes Chain of events including reaction signs/symptoms, treatment administered, and outcome (e.g., drug resumed; drug discontinued; sent to Emergency Department; etc.) Patient called out and stated she was feeling flushed, Gemzar  stopped. Patient's face appears red. Vitals within normal limits. 50 mL left in bag Normal saline bolus started. Dr. Katragadda called and made aware. Per MD do not restart Gemzar  and once patient feels like she is no longer flush she is okay to go. After 30 minutes patient stated she felt better and was ready to go home, port flushed with good blood return and patient discharged in satisfactory condition.  Vernell DELENA Pepper, RN 01/17/2023 3:40 PM

## 2023-01-17 NOTE — Patient Instructions (Signed)
 [facility] - A DEPT OF [facility]  Discharge Instructions: Thank you for choosing [facility] to provide your oncology and hematology care.  If you have a lab appointment with the Cancer Center - please note that after April 8th, 2024, all labs will be drawn in the cancer center.  You do not have to check in or register with the main entrance as you have in the past but will complete your check-in in the cancer center.  Wear comfortable clothing and clothing appropriate for easy access to any Portacath or PICC line.   We strive to give you quality time with your provider. You may need to reschedule your appointment if you arrive late (15 or more minutes).  Arriving late affects you and other patients whose appointments are after yours.  Also, if you miss three or more appointments without notifying the office, you may be dismissed from the clinic at the provider's discretion.      For prescription refill requests, have your pharmacy contact our office and allow 72 hours for refills to be completed.    Today you received the following chemotherapy and/or immunotherapy agents Gemzar , return as scheduled.   To help prevent nausea and vomiting after your treatment, we encourage you to take your nausea medication as directed.  BELOW ARE SYMPTOMS THAT SHOULD BE REPORTED IMMEDIATELY: *FEVER GREATER THAN 100.4 F (38 C) OR HIGHER *CHILLS OR SWEATING *NAUSEA AND VOMITING THAT IS NOT CONTROLLED WITH YOUR NAUSEA MEDICATION *UNUSUAL SHORTNESS OF BREATH *UNUSUAL BRUISING OR BLEEDING *URINARY PROBLEMS (pain or burning when urinating, or frequent urination) *BOWEL PROBLEMS (unusual diarrhea, constipation, pain near the anus) TENDERNESS IN MOUTH AND THROAT WITH OR WITHOUT PRESENCE OF ULCERS (sore throat, sores in mouth, or a toothache) UNUSUAL RASH, SWELLING OR PAIN  UNUSUAL VAGINAL DISCHARGE OR ITCHING   Items with * indicate a potential emergency and  should be followed up as soon as possible or go to the Emergency Department if any problems should occur.  Please show the CHEMOTHERAPY ALERT CARD or IMMUNOTHERAPY ALERT CARD at check-in to the Emergency Department and triage nurse.  Should you have questions after your visit or need to cancel or reschedule your appointment, please contact [facility] [place] - A DEPT OF [facility] [phone]  and follow the prompts.  Office hours are 8:00 a.m. to 4:30 p.m. Monday - Friday. Please note that voicemails left after 4:00 p.m. may not be returned until the following business day.  We are closed weekends and major holidays. You have access to a nurse at all times for urgent questions. Please call the main number to the clinic [phone] and follow the prompts.  For any non-urgent questions, you may also contact your provider using MyChart. We now offer e-Visits for anyone 54 and older to request care online for non-urgent symptoms. For details visit [email or web address].   Also download the MyChart app! Go to the app store, search MyChart, open the app, select [place], and log in with your MyChart username and password.

## 2023-01-20 ENCOUNTER — Other Ambulatory Visit: Payer: Self-pay | Admitting: *Deleted

## 2023-01-20 MED ORDER — OXYCODONE HCL ER 60 MG PO T12A: 1.0000 | EXTENDED_RELEASE_TABLET | Freq: Two times a day (BID) | ORAL | 0 refills | Status: DC | PRN

## 2023-01-22 ENCOUNTER — Encounter (HOSPITAL_COMMUNITY): Payer: Self-pay

## 2023-01-22 ENCOUNTER — Other Ambulatory Visit: Payer: Self-pay | Admitting: *Deleted

## 2023-01-22 ENCOUNTER — Ambulatory Visit (HOSPITAL_COMMUNITY)
Admission: RE | Admit: 2023-01-22 | Discharge: 2023-01-22 | Disposition: A | Discharge status: Home / Self Care | Payer: 59 | Source: Ambulatory Visit | Attending: Hematology | Admitting: Hematology

## 2023-01-22 DIAGNOSIS — R180 Malignant ascites: Secondary | ICD-10-CM | POA: Insufficient documentation

## 2023-01-22 DIAGNOSIS — C569 Malignant neoplasm of unspecified ovary: Secondary | ICD-10-CM | POA: Diagnosis not present

## 2023-01-22 DIAGNOSIS — R188 Other ascites: Secondary | ICD-10-CM | POA: Diagnosis not present

## 2023-01-22 MED ORDER — ALBUMIN HUMAN 25 % IV SOLN
INTRAVENOUS | Status: AC
  Filled 2023-01-22: qty 100

## 2023-01-22 MED ORDER — ONDANSETRON 4 MG PO TBDP: 4.0000 mg | ORAL_TABLET | Freq: Three times a day (TID) | ORAL | 2 refills | Status: DC | PRN

## 2023-01-22 MED ORDER — ONDANSETRON HCL 4 MG/2ML IJ SOLN
8.0000 mg | Freq: Once | INTRAMUSCULAR | Status: AC
  Administered 2023-01-22: 8 mg via INTRAVENOUS
  Filled 2023-01-22 (×2): qty 4

## 2023-01-22 MED ORDER — ALBUMIN HUMAN 25 % IV SOLN
50.0000 g | Freq: Once | INTRAVENOUS | Status: AC
  Administered 2023-01-22: 50 g via INTRAVENOUS

## 2023-01-22 NOTE — Progress Notes (Signed)
 Patient arrived with nausea and vomiting today. Oncology MD gave order to give Zofran  8mg  IV push. At completion of paracentesis today patient verbalized relief of nausea from medication. Patient tolerated right sided paracentesis procedure and 50G of IV albumin  well and 600 mL of ascites removed. Patient verbalized understanding of discharge instructions and left via wheelchair with no acute distress noted.

## 2023-01-22 NOTE — Procedures (Signed)
 PROCEDURE SUMMARY:  Successful image-guided paracentesis from the right lower abdomen.  Yielded 600 milliliters of clear yellow fluid.  No immediate complications.  EBL < 1 mL Patient tolerated well.   Specimen was not sent for labs.  Of note, loculations were seen on today's US  examination.  Please see imaging section of Epic for full dictation.  Clotilda DELENA Hesselbach PA-C 01/22/2023 12:04 PM

## 2023-01-23 ENCOUNTER — Inpatient Hospital Stay: Payer: 59

## 2023-01-23 ENCOUNTER — Other Ambulatory Visit: Payer: 59

## 2023-01-23 ENCOUNTER — Inpatient Hospital Stay: Payer: 59 | Admitting: Hematology

## 2023-01-27 NOTE — Progress Notes (Incomplete)
 49  [facility] [address], [place] [address]    Clinic Day:  01/27/23   Referring physician: Ignatius Specking, MD  Patient Care Team: Ignatius Specking, MD as PCP - General (Internal Medicine) Doreatha Massed, MD as Medical

## 2023-01-28 ENCOUNTER — Inpatient Hospital Stay: Payer: 59 | Admitting: Hematology

## 2023-01-28 ENCOUNTER — Inpatient Hospital Stay: Payer: 59

## 2023-01-29 ENCOUNTER — Encounter: Payer: Self-pay | Admitting: Hematology

## 2023-01-29 ENCOUNTER — Other Ambulatory Visit: Payer: Self-pay

## 2023-01-29 ENCOUNTER — Encounter (HOSPITAL_COMMUNITY): Payer: Self-pay | Admitting: Hematology

## 2023-01-29 ENCOUNTER — Ambulatory Visit (HOSPITAL_COMMUNITY): Admission: RE | Admit: 2023-01-29 | Payer: 59 | Source: Ambulatory Visit

## 2023-01-30 ENCOUNTER — Inpatient Hospital Stay: Payer: 59

## 2023-01-30 ENCOUNTER — Telehealth: Payer: Self-pay | Admitting: *Deleted

## 2023-01-30 NOTE — Telephone Encounter (Signed)
Niece Morrie Sheldon called stating that patient has not eaten in several days.  She is unable to keep anything down and is extremely weak.  States that she has become incontinent of bowels over the last couple of days.  She has missed appointments for treatment over the past 2 weeks due to not feeling well enough to receive treatment.  Advised to go to the ER for evaluation.  Verbalized understanding.

## 2023-02-03 NOTE — Progress Notes (Incomplete)
49  [facility] [address], [place] [address]    Clinic Day:  02/03/23   Referring physician: Ignatius Specking, MD  Patient Care Team: Ignatius Specking, MD as PCP - General (Internal Medicine) Doreatha Massed, MD as Medical Oncologist (Oncology)   ASSESSMENT & PLAN:   Assessment: 1.  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 -Robotic assisted laparoscopic TAH and BSO and omentectomy on 12/24/2018, pathology showing high-grade serous carcinoma, PT3P NX. -Germline mutation testing was negative. -3 more cycles of adjuvant chemotherapy completed on 03/15/2019. -CTAP on 04/13/2019 showed no findings of active malignancy.  28% reduction in the volume of presumed chronic hematoma/chronic fluid collection splaying the upper margin of the spleen.  Large type III hiatal hernia. -CTAP on 10/13/2019 shows no findings of recurrence or metastatic disease. - Foundation 1 shows HRD+, LOH score>16%.  MSI-stable.  MYC amplification.  T p53 mutation. - We reviewed CT CAP from 02/21/2021 which showed progressive peritoneal metastasis compared to 12/06/2020 scan.  No bowel obstruction.  Small right pleural effusion with mild enlargement of an isolated left external iliac lymph node. - Reviewed EGD from 02/28/2021 which showed hiatal hernia and normal findings. - She reported epigastric and left upper quadrant pain worse in the last 1 week which is related to progression of her malignancy. - 6 cycles of carboplatin and paclitaxel from 03/13/2021 through 06/18/2021 - CT CAP (07/12/2021): No evidence of recurrence or metastatic disease. - Olaparib 300 mg twice daily from 08/22/2021 through 11/28/2022 with progression - Cycle 1 carboplatin, gemcitabine and bevacizumab on 01/07/2023    Plan: 1.  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 - She missed appointment for paracentesis on 01/10/2023 as she was feeling very sick and vomiting on that day. - Today she is in distress from abdominal distention.  She also has swelling up to her thighs from pressure. - Labs today: AST and ALT are elevated 6 times above the upper limit of normal.  Mild leukopenia and thrombocytopenia was seen as expected. - I think she needs immediate paracentesis.  I have reached out to the ER physician who will coordinate the process. - She will come back on Thursday for her day 8 of chemotherapy.   2.  Lower back/left upper quadrant pain: - She is taking oxycodone 36 mg every 6 hours and Dilaudid 8 mg every 8 hours as needed.  She takes half tablet of Tylenol occasionally.   3.  Numbness in the feet/cramping in the hands: - Continue gabapentin 200 mg at bedtime.  4.  Iron deficiency state: - Last Feraheme on 06/24/2022.  Hemoglobin today is 11.3.  5.  Constipation: - Continue stool softeners daily as needed.  6.  Hypomagnesemia: - Magnesium is normal today.  Continue magnesium twice daily.    No orders of the defined types were placed in this encounter.     Alben Deeds Teague,acting as a Neurosurgeon for Doreatha Massed, MD.,have documented all relevant documentation on the behalf of Doreatha Massed, MD,as directed by  Doreatha Massed, MD while in the presence of Doreatha Massed, MD.  ***     [place] R Teague   1/20/202510:00 AM  CHIEF COMPLAINT:   Diagnosis: high-grade serous ovarian cancer    Cancer Staging  No  matching staging information was found for the patient.    Prior Therapy: 1. Carboplatin and paclitaxel x 7 cycles from 08/24/2018 to 03/15/2019. 2. Laparoscopic TAH & BSO & omenectomy on 12/24/2018. 3.  6 cycles of carboplatin and paclitaxel from 03/05/2021 to 06/18/2021  Current Therapy:   olaparib    HISTORY OF PRESENT ILLNESS:   Oncology History  Ovarian cancer, bilateral (HCC)  07/07/2018 Pathology Results   PLEURAL FLUID, LEFT (SPECIMEN 1 OF 1, COLLECTED 07/07/18): - MALIGNANT CELLS CONSISTENT WITH ADENOCARCINOMA - SEE COMMENT  Source Pleural Fluid, (Specimen 1 of 1, collected on 07/07/2018) Gross Specimen: Received is/are 1000cc of bloody red fluid with tissue. (TC:tc) Prepared: # Smears: 0 # Concentration Technique Slides (i.e. ThinPrep): 1 # Cell Block: 1 Conventional Additional Studies: Two Hematology slides labeled [id number] Comment The malignant cells are positive for cytokeratin 7, p53, WT-1, Pax-8, Moc31, ER (weak) and EMA but negative for cytokeratin 20, TTF-1, GATA-3, CDX-2 and D2-40. Overall, the phenotype is consistent with a gynecologic primary; clinical correlation recommended.   07/07/2018 Procedure   Successful ultrasound guided left thoracentesis yielding 2.0 L of pleural fluid   07/08/2018 Procedure   1. Technically successful placement of left 14 French pigtail chest drain, placed to Pleur-evac water-seal.   07/08/2018 Procedure   1. Technically successful five [place] double lumen power injectable PICC placement   07/09/2018 Imaging   Ct chest 1. There is a moderate, loculated left hydropneumothorax with a small air component and moderate fluid component. The largest loculated component is located posteriorly. There is a pigtail drainage catheter about the lateral pleural space. There is no obvious etiology, such as obvious mass or pleural disease.   2. There is a small right pleural effusion with associated atelectasis or consolidation and a subpleural consolidation of the superior segment right lower lobe (series 4, image 56), of uncertain significance, possibly infectious or inflammatory   07/10/2018 Imaging   Ct abdomen and pelvis: 1. The bilateral ovaries are enlarged by heterogeneous appearing cystic lesions, measuring 5.3 x 4.2 cm on the  right (series 4, image 72) and 4.5 x 3.2 cm on the left (series 4, image 75). Consider dedicated pelvic ultrasound and/or pelvic MRI to further evaluate for solid components given high suspicion for GYN primary malignancy.   2. No other evidence of mass and no lymphadenopathy in the abdomen or pelvis.   3. Trace ascites. There is some suggestion of omental and peritoneal nodularity (e.g. Series 4, image 43), concerning for peritoneal metastatic disease.    4. Loculated left-sided pleural effusion with left-sided pleural drainage catheter in position. Small right pleural effusion   07/13/2018 Surgery   OPERATION: 1.  Left VATS (video-assisted thoracoscopic surgery) for drainage of loculated pleural effusion. 2.  Talc pleurodesis for malignant pleural effusion. 3.  Placement of PleurX catheter for management of malignant pleural effusion. 4.  Placement of On-Q analgesia catheter system.    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   07/13/2018 Pathology Results   Pleura, peel, Left Pleural - FIBRO-FIBRINOUS PLEURITIS - NEGATIVE FOR MALIGNANCY   07/18/2018 Initial Diagnosis   Ovarian cancer, bilateral (HCC)   07/20/2018 Procedure   EGD impression: Normal proximal esophagus and mid esophagus. Mild distal esophageal rings; dilation not performed because of esophagitis. LA Grade C reflux esophagitis. Z-line regular, 30 cm from the incisors. 5 cm hiatal hernia.  Non-bleeding gastric ulcer with no stigmata of bleeding. Gastritis. Duodenal erosions without bleeding. Normal second portion of the duodenum. No specimens collected.   08/19/2018 Genetic Testing   Negative genetic testing on the common hereditary cancer panel.  The Common Hereditary Gene Panel offered by [facility] includes sequencing and/or deletion  duplication testing of the following 48 genes: APC, ATM, AXIN2, BARD1, BMPR1A, BRCA1, BRCA2, BRIP1, CDH1, CDK4, CDKN2A (p14ARF), CDKN2A (p16INK4a), CHEK2, CTNNA1, DICER1, EPCAM (Deletion/duplication testing only), GREM1 (promoter region deletion/duplication testing only), KIT, MEN1, MLH1, MSH2, MSH3, MSH6, MUTYH, NBN, NF1, NHTL1, PALB2, PDGFRA, PMS2, POLD1, POLE, PTEN, RAD50, RAD51C, RAD51D, RNF43, SDHB, SDHC, SDHD, SMAD4, SMARCA4. STK11, TP53, TSC1, TSC2, and VHL.  The following genes were evaluated for sequence changes only: SDHA and HOXB13 c.251G>A variant only. The report date is August 19, 2018.   08/24/2018 - 03/15/2019 Chemotherapy   Patient is on Treatment Plan : OVARIAN Carboplatin (AUC 6) / Paclitaxel (175) q21d x 6 cycles     03/05/2021 - 06/18/2021 Chemotherapy   Patient is on Treatment Plan : OVARIAN Carboplatin (AUC 6) / Paclitaxel (175) q21d x 6 cycles     01/07/2023 -  Chemotherapy   Patient is on Treatment Plan : OVARIAN RECURRENT 3RD LINE Carboplatin D1 + Gemcitabine D1,8 (4/800) q21d        INTERVAL HISTORY:   Michelle Holt is a 53 y.o. female presenting to clinic today for follow up of high-grade serous ovarian cancer. She was last seen by me on 01/14/23.  Patient was admitted to the hospital on 01/15/23 for malignant ascites and underwent paracentesis with 4.7 L of fluid drained.   Today, she states that she is doing well overall. Her appetite level is at ***%. Her energy level is at ***%. She is accompanied by her a family member.   PAST MEDICAL HISTORY:   Past Medical History: Past Medical History:  Diagnosis Date   Anemia    Anxiety and depression    Arthritis of facet joints at multiple vertebral levels    L5-S1   Constipation    Dyslipidemia    Family history of breast cancer    Family history of uterine cancer    GERD (gastroesophageal reflux disease)    History of hiatal hernia    History of kidney stones    Insomnia    Irritable bowel syndrome    Migraine    Muscle  tension headache    Neuropathy of finger    Ovarian carcinoma (HCC)    ovarian   Plantar fasciitis of right foot    Port-A-Cath in place 08/20/2018    Surgical History: Past Surgical History:  Procedure Laterality Date   ABDOMINAL HYSTERECTOMY     BIOPSY  10/27/2020   Procedure: BIOPSY;  Surgeon: Dolores Frame, MD;  [facility];  Service: Gastroenterology;;   CHOLECYSTECTOMY  2008   COLONOSCOPY N/A 08/13/2013   Procedure: COLONOSCOPY;  Surgeon: Malissa Hippo, MD;  [facility];  Service: Endoscopy;  Laterality: N/A;  230-moved to 145 Ann to notify pt   COLONOSCOPY WITH PROPOFOL N/A 11/15/2020   Procedure: COLONOSCOPY WITH PROPOFOL;  Surgeon: Malissa Hippo, MD;  [facility];  Service: Endoscopy;  Laterality: N/A;  1:40   ESOPHAGEAL DILATION N/A 02/28/2021   Procedure: ESOPHAGEAL DILATION;  Surgeon: Malissa Hippo, MD;  [facility];  Service: Endoscopy;  Laterality: N/A;   ESOPHAGOGASTRODUODENOSCOPY     ESOPHAGOGASTRODUODENOSCOPY (EGD) WITH PROPOFOL N/A 07/20/2018   Procedure: ESOPHAGOGASTRODUODENOSCOPY (  EGD) WITH PROPOFOL;  Surgeon: Malissa Hippo, MD;  [facility];  Service: Endoscopy;  Laterality: N/A;  Possible esophageal dilation.   ESOPHAGOGASTRODUODENOSCOPY (EGD) WITH PROPOFOL N/A 10/27/2020   Procedure: ESOPHAGOGASTRODUODENOSCOPY (EGD) WITH PROPOFOL;  Surgeon: Dolores Frame, MD;  [facility];  Service: Gastroenterology;  Laterality: N/A;  2:10, pt knows to arrive at 10:15   ESOPHAGOGASTRODUODENOSCOPY (EGD) WITH PROPOFOL N/A 02/28/2021   Procedure: ESOPHAGOGASTRODUODENOSCOPY (EGD) WITH PROPOFOL;  Surgeon: Malissa Hippo, MD;  [facility];  Service: Endoscopy;  Laterality: N/A;  200 ASA 1   ESOPHAGOGASTRODUODENOSCOPY (EGD) WITH PROPOFOL N/A 03/29/2022   Procedure: ESOPHAGOGASTRODUODENOSCOPY (EGD) WITH PROPOFOL;  Surgeon: Dolores Frame, MD;  [facility];  Service: Gastroenterology;  Laterality: N/A;  2:00 pm, asa 1-2   GIVENS CAPSULE STUDY N/A 11/24/2020   Procedure: GIVENS CAPSULE STUDY;  Surgeon: Malissa Hippo, MD;  [facility];  Service: Endoscopy;  Laterality: N/A;  7:30   IR ANGIOGRAM SELECTIVE EACH ADDITIONAL VESSEL  08/01/2018   IR ANGIOGRAM VISCERAL SELECTIVE  08/01/2018   IR EMBO ART  VEN HEMORR LYMPH EXTRAV  INC GUIDE ROADMAPPING  08/01/2018   IR IMAGING GUIDED PORT INSERTION  08/20/2018   IR PERC PLEURAL DRAIN W/INDWELL CATH W/IMG GUIDE  07/08/2018   IR THORACENTESIS ASP PLEURAL SPACE W/IMG GUIDE  07/07/2018   IR US GUIDE VASC ACCESS RIGHT  08/01/2018   PLEURAL EFFUSION DRAINAGE Left 07/13/2018   Procedure: DRAINAGE OF LOCULATED PLEURAL EFFUSION;  Surgeon: Kerin Perna, MD;  Location: [facility] OR;  Service: Thoracic;  Laterality: Left;   POLYPECTOMY  11/15/2020   Procedure: POLYPECTOMY;  Surgeon: Malissa Hippo, MD;  [facility];  Service: Endoscopy;;   REMOVAL OF PLEURAL DRAINAGE CATHETER Left 08/20/2018   Procedure: REMOVAL OF PLEURAL DRAINAGE CATHETER;  Surgeon: Kerin Perna, MD;  Location: [facility] OR;  Service: Thoracic;  Laterality: Left;   REMOVAL OF PLEURAL DRAINAGE CATHETER Left 08/20/2018   Procedure: REMOVAL OF PLEURAL DRAINAGE CATHETER;  Surgeon: Kerin Perna, MD;  Location: [facility] OR;  Service: Thoracic;  Laterality: Left;   TALC PLEURODESIS Left 07/13/2018   Procedure: Talc Pleuradesis;  Surgeon: Donata Clay, Theron Arista, MD;  Location: [facility] OR;  Service: Thoracic;  Laterality: Left;   TOTAL HIP ARTHROPLASTY Left 06/05/2020   Procedure: LEFT TOTAL HIP ARTHROPLASTY ANTERIOR APPROACH;  Surgeon: Tarry Kos, MD;  Location: [facility] OR;  Service: Orthopedics;  Laterality: Left;  3-C   TUBAL LIGATION Bilateral    UTERINE ABLATION     VIDEO ASSISTED THORACOSCOPY Left 07/13/2018   Procedure: VIDEO ASSISTED THORACOSCOPY;  Surgeon: Kerin Perna, MD;  Location: [facility] OR;  Service: Thoracic;  Laterality: Left;    Social  History: Social History   Socioeconomic History   Marital status: Widowed    Spouse name: Not on file   Number of children: 2   Years of education: 2-College   Highest education level: Not on file  Occupational History    Employer: [employer]  Tobacco Use   Smoking status: Every Day    Current packs/day: 0.50    Average packs/day: 0.5 packs/day for 17.1 years (8.6 ttl pk-yrs)    Types: Cigarettes    Start date: 06/21/2001    Last attempt to quit: 06/22/2018   Smokeless tobacco: Never  Vaping Use   Vaping status: Never Used  Substance and Sexual Activity   Alcohol use: Not Currently    Alcohol/week: 1.0 standard drink  of alcohol    Types: 1 Glasses of wine per week    Comment: occasionally   Drug use: No   Sexual activity: Not Currently  Other Topics Concern   Not on file  Social History Narrative   Patient lives at home with her daughter.    Patient has 2 children.    Patient is widowed.    Patient is right handed.    Patient has her Associates degree.      Social Drivers of Corporate investment banker Strain: Low Risk  (08/26/2022)   Received from [facility]   Overall Financial Resource Strain (CARDIA)    Difficulty of Paying Living Expenses: Not hard at all  Food Insecurity: No Food Insecurity (01/15/2023)   Hunger Vital Sign    Worried About Running Out of Food in the Last Year: Never true    Ran Out of Food in the Last Year: Never true  Transportation Needs: No Transportation Needs (01/15/2023)   PRAPARE - Administrator, Civil Service (Medical): No    Lack of Transportation (Non-Medical): No  Physical Activity: Inactive (07/22/2018)   Exercise Vital Sign    Days of Exercise per Week: 0 days    Minutes of Exercise per Session: 0 min  Stress: Stress Concern Present (07/22/2018)   [employer] of Occupational Health - Occupational Stress Questionnaire    Feeling of Stress : Very much  Social Connections: Moderately Isolated (07/22/2018)    Social Connection and Isolation Panel [NHANES]    Frequency of Communication with Friends and Family: More than three times a week    Frequency of Social Gatherings with Friends and Family: More than three times a week    Attends Religious Services: 1 to 4 times per year    Active Member of [employer] or Organizations: No    Attends Banker Meetings: Never    Marital Status: Widowed  Intimate Partner Violence: Not At Risk (01/15/2023)   Humiliation, Afraid, Rape, and Kick questionnaire    Fear of Current or Ex-Partner: No    Emotionally Abused: No    Physically Abused: No    Sexually Abused: No    Family History: Family History  Problem Relation Age of Onset   Depression Mother    Hypertension Mother    Obesity Mother    Diabetes Mother    Kidney disease Mother    Peripheral vascular disease Father    Atrial fibrillation Father    Crohn's disease Sister    Uterine cancer Sister 60       maternal half sister   COPD Brother    Osteoporosis Brother    Breast cancer Maternal Aunt 50   Colon cancer Neg Hx     Current Medications:  Current Outpatient Medications:    albuterol (VENTOLIN HFA) 108 (90 Base) MCG/ACT inhaler, Inhale 2 puffs into the lungs every 6 (six) hours as needed for wheezing or shortness of breath., Disp: , Rfl:    dexamethasone (DECADRON) 4 MG tablet, Take 1 tablet (4 mg total) by mouth daily., Disp: 30 tablet, Rfl: 0   diazepam (VALIUM) 5 MG tablet, Take 1 tablet (5 mg total) by mouth at bedtime., Disp: 30 tablet, Rfl: 5   dicyclomine (BENTYL) 20 MG tablet, Take 1 tablet (20 mg total) by mouth every 6 (six) hours as needed for spasms., Disp: 120 tablet, Rfl: 1   gabapentin (NEURONTIN) 100 MG capsule, TAKE THREE CAPSULES BY MOUTH DAILY AT BEDTIME (  Patient taking differently: Take 300 mg by mouth 2 (two) times daily as needed (pain).), Disp: 90 capsule, Rfl: 3   HYDROmorphone (DILAUDID) 8 MG tablet, Take 1 tablet (8 mg total) by mouth every 4 (four) hours  as needed (Breakthrough Pain)., Disp: 180 tablet, Rfl: 0   magic mouthwash w/lidocaine SOLN, Take 5 mLs by mouth 4 (four) times daily as needed for mouth pain. Suspension contains equal amounts of Maalox Extra Strength, nystatin, diphenhydramine and lidocaine., Disp: 100 mL, Rfl: 1   naloxegol oxalate (MOVANTIK) 25 MG TABS tablet, Take 1 tablet (25 mg total) by mouth daily., Disp: 30 tablet, Rfl: 0   omeprazole (PRILOSEC) 40 MG capsule, Take 1 capsule (40 mg total) by mouth daily., Disp: 30 capsule, Rfl: 0   ondansetron (ZOFRAN-ODT) 4 MG disintegrating tablet, Take 1 tablet (4 mg total) by mouth every 8 (eight) hours as needed for nausea or vomiting., Disp: 90 tablet, Rfl: 2   oxyCODONE (OXYCONTIN) 60 MG 12 hr tablet, Take 1 tablet by mouth every 12 (twelve) hours as needed., Disp: 60 tablet, Rfl: 0   oxyCODONE ER 36 MG C12A, Take 1 capsule (36 mg total) by mouth 4 (four) times daily., Disp: 60 capsule, Rfl: 0 No current facility-administered medications for this visit.  Facility-Administered Medications Ordered in Other Visits:    0.9 %  sodium chloride infusion, , Intravenous, Continuous, Doreatha Massed, MD, Stopped at 10/23/20 1547   sodium chloride flush (NS) 0.9 % injection 10 mL, 10 mL, Intravenous, PRN, Doreatha Massed, MD, 10 mL at 10/23/20 1544   Allergies: Allergies  Allergen Reactions   Morphine And Codeine Itching   Nickel Itching   Nortriptyline Other (See Comments)    Significant weight gain   Topamax [Topiramate] Diarrhea and Nausea Only   Xanax [Alprazolam] Other (See Comments)    "Can't wake up"   Actifed Cold-Allergy [Chlorpheniramine-Phenyleph Er] Rash and Other (See Comments)    Red dye only   Amoxicillin Rash   Codeine Hives   Erythromycin Rash   Gemzar [Gemcitabine] Other (See Comments)    Feeling flushed, red in the face   Penicillins Rash        Red Dye Rash   Sudafed [Pseudoephedrine Hcl] Rash and Other (See Comments)    Red dye only    REVIEW  OF SYSTEMS:   Review of Systems  Constitutional:  Negative for chills, fatigue and fever.  HENT:   Negative for lump/mass, mouth sores, nosebleeds, sore throat and trouble swallowing.   Eyes:  Negative for eye problems.  Respiratory:  Negative for cough and shortness of breath.   Cardiovascular:  Negative for chest pain, leg swelling and palpitations.  Gastrointestinal:  Negative for abdominal pain, constipation, diarrhea, nausea and vomiting.  Genitourinary:  Negative for bladder incontinence, difficulty urinating, dysuria, frequency, hematuria and nocturia.   Musculoskeletal:  Negative for arthralgias, back pain, flank pain, myalgias and neck pain.  Skin:  Negative for itching and rash.  Neurological:  Negative for dizziness, headaches and numbness.  Hematological:  Does not bruise/bleed easily.  Psychiatric/Behavioral:  Negative for depression, sleep disturbance and suicidal ideas. The patient is not nervous/anxious.   All other systems reviewed and are negative.    VITALS:   There were no vitals taken for this visit.  Wt Readings from Last 3 Encounters:  01/17/23 184 lb 12.8 oz (83.8 kg)  01/14/23 200 lb (90.7 kg)  01/14/23 200 lb (90.7 kg)    There is no height or weight on file to  calculate BMI.  Performance status (ECOG): 1 - Symptomatic but completely ambulatory  PHYSICAL EXAM:   Physical Exam Vitals and nursing note reviewed. Exam conducted with a chaperone present.  Constitutional:      Appearance: Normal appearance.  Cardiovascular:     Rate and Rhythm: Normal rate and regular rhythm.     Pulses: Normal pulses.     Heart sounds: Normal heart sounds.  Pulmonary:     Effort: Pulmonary effort is normal.     Breath sounds: Normal breath sounds.  Abdominal:     Palpations: Abdomen is soft. There is no hepatomegaly, splenomegaly or mass.     Tenderness: There is no abdominal tenderness.  Musculoskeletal:     Right lower leg: No edema.     Left lower leg: No edema.   Lymphadenopathy:     Cervical: No cervical adenopathy.     Right cervical: No superficial, deep or posterior cervical adenopathy.    Left cervical: No superficial, deep or posterior cervical adenopathy.     Upper Body:     Right upper body: No supraclavicular or axillary adenopathy.     Left upper body: No supraclavicular or axillary adenopathy.  Neurological:     General: No focal deficit present.     Mental Status: She is alert and oriented to person, place, and time.  Psychiatric:        Mood and Affect: Mood normal.        Behavior: Behavior normal.     LABS:      Latest Ref Rng & Units 01/17/2023   12:36 PM 01/14/2023    2:20 PM 01/14/2023   10:58 AM  CBC  WBC 4.0 - 10.5 K/uL 2.4  3.2  3.3   Hemoglobin 12.0 - 15.0 g/dL 91.4  78.2  95.6   Hematocrit 36.0 - 46.0 % 34.5  33.9  34.2   Platelets 150 - 400 K/uL 72  122  119       Latest Ref Rng & Units 01/17/2023   12:36 PM 01/14/2023    2:20 PM 01/14/2023   10:58 AM  CMP  Glucose 70 - 99 mg/dL 91  213  086   BUN 6 - 20 mg/dL 17  19  20    Creatinine 0.44 - 1.00 mg/dL 5.78  4.69  6.29   Sodium 135 - 145 mmol/L 127  127  128   Potassium 3.5 - 5.1 mmol/L 4.1  4.0  4.1   Chloride 98 - 111 mmol/L 89  91  92   CO2 22 - 32 mmol/L 26  27  24    Calcium 8.9 - 10.3 mg/dL 8.4  8.5  8.9   Total Protein 6.5 - 8.1 g/dL 7.0  7.0  7.3   Total Bilirubin 0.0 - 1.2 mg/dL 1.2  0.7  0.7   Alkaline Phos 38 - 126 U/L 1,577  1,367  1,505   AST 15 - 41 U/L 341  212  238   ALT 0 - 44 U/L 193  146  158      No results found for: "CEA1", "CEA" / No results found for: "CEA1", "CEA" No results found for: "PSA1" No results found for: "BMW413" Lab Results  Component Value Date   CAN125 151.0 (H) 11/12/2022    No results found for: "TOTALPROTELP", "ALBUMINELP", "A1GS", "A2GS", "BETS", "BETA2SER", "GAMS", "MSPIKE", "SPEI" Lab Results  Component Value Date   TIBC 288 11/12/2022   TIBC 329 08/05/2022   TIBC 346 05/27/2022  FERRITIN 206  11/12/2022   FERRITIN 219 08/05/2022   FERRITIN 67 05/27/2022   IRONPCTSAT 17 11/12/2022   IRONPCTSAT 29 08/05/2022   IRONPCTSAT 14 05/27/2022   Lab Results  Component Value Date   LDH 277 (H) 07/18/2018   LDH 193 (H) 07/07/2018     STUDIES:   US Paracentesis Result Date: 01/22/2023 INDICATION: Patient with history of metastatic ovarian cancer, recurrent ascites. Request for therapeutic paracentesis. EXAM: ULTRASOUND GUIDED THERAPEUTIC PARACENTESIS MEDICATIONS: 10 mL 1% lidocaine COMPLICATIONS: None immediate. PROCEDURE: Informed written consent was obtained from the patient after a discussion of the risks, benefits and alternatives to treatment. A timeout was performed prior to the initiation of the procedure. Initial ultrasound scanning demonstrates a small amount of loculated ascites within the right lower abdominal quadrant. The largest pocket was selected for drainage and the right lower abdomen was prepped and draped in the usual sterile fashion. 1% lidocaine was used for local anesthesia. Following this, a 19 gauge, 7-cm, Yueh catheter was introduced. An ultrasound image was saved for documentation purposes. The paracentesis was performed. The catheter was removed and a dressing was applied. The patient tolerated the procedure well without immediate post procedural complication. Patient received post-procedure intravenous albumin; see nursing notes for details. FINDINGS: A total of approximately 600 mL of clear yellow fluid was removed. IMPRESSION: Successful ultrasound-guided paracentesis yielding 600 milliliters of peritoneal fluid. Performed by Lynnette Caffey, PA-C Electronically Signed   By: Marliss Coots M.D.   On: 01/22/2023 15:58   US Paracentesis Result Date: 01/17/2023 INDICATION: Patient with history of metastatic ovarian cancer, recurrent ascites. Request for therapeutic paracentesis. EXAM: ULTRASOUND GUIDED THERAPEUTIC PARACENTESIS MEDICATIONS: 8 mL 1% lidocaine.  COMPLICATIONS: None immediate. PROCEDURE: Informed written consent was obtained from the patient after a discussion of the risks, benefits and alternatives to treatment. A timeout was performed prior to the initiation of the procedure. Initial ultrasound scanning demonstrates a small amount of ascites within the right lower abdominal quadrant. The right lower abdomen was prepped and draped in the usual sterile fashion. 1% lidocaine was used for local anesthesia. Following this, a 19 gauge, 7-cm, Yueh catheter was introduced. An ultrasound image was saved for documentation purposes. The paracentesis was performed. The catheter was removed and a dressing was applied. The patient tolerated the procedure well without immediate post procedural complication. Patient received post-procedure intravenous albumin; see nursing notes for details. FINDINGS: A total of approximately 1.7 L of clear yellow fluid was removed. IMPRESSION: Successful ultrasound-guided paracentesis yielding 1.7 liters of peritoneal fluid. Performed by Lynnette Caffey, PA-C Electronically Signed   By: Marliss Coots M.D.   On: 01/17/2023 14:13   US Paracentesis Result Date: 01/15/2023 INDICATION: Patient with a history of metastatic ovarian cancer with recurrent malignant ascites. Interventional radiology asked to perform a therapeutic paracentesis. EXAM: ULTRASOUND GUIDED PARACENTESIS MEDICATIONS: 1% lidocaine 10 mL COMPLICATIONS: None immediate. PROCEDURE: Informed written consent was obtained from the patient after a discussion of the risks, benefits and alternatives to treatment. A timeout was performed prior to the initiation of the procedure. Initial ultrasound scanning demonstrates a large amount of ascites within the right lower abdominal quadrant. The right lower abdomen was prepped and draped in the usual sterile fashion. 1% lidocaine was used for local anesthesia. Following this, a 19 gauge, 7-cm, Yueh catheter was introduced. An  ultrasound image was saved for documentation purposes. The paracentesis was performed. After approximately 2 L of fluid removal the catheter stopped draining. Ultrasound imaging demonstrated an adequate pocket of residual  fluid. The site was re-prepped with chlorhexidine and a 10 cm Yueh was inserted using sterile technique. The paracentesis was performed. The catheter was removed and a dressing was applied. The patient tolerated the procedure well without immediate post procedural complication. FINDINGS: A total of approximately 4.7 L of clear yellow fluid was removed. IMPRESSION: Successful ultrasound-guided paracentesis yielding 4.7 liters of peritoneal fluid. Procedure performed by Alwyn Ren NP Electronically Signed   By: Richarda Overlie M.D.   On: 01/15/2023 10:52   US Abdomen Limited Result Date: 01/14/2023 CLINICAL DATA:  Evaluate for ascites EXAM: LIMITED ABDOMEN ULTRASOUND FOR ASCITES TECHNIQUE: Limited ultrasound survey for ascites was performed in all four abdominal quadrants. COMPARISON:  CT abdomen pelvis 12/24/2022 FINDINGS: Large volume ascites demonstrated throughout all 4 quadrants. IMPRESSION: Large volume ascites. Electronically Signed   By: Annia Belt M.D.   On: 01/14/2023 15:42

## 2023-02-04 ENCOUNTER — Inpatient Hospital Stay: Payer: 59

## 2023-02-04 ENCOUNTER — Inpatient Hospital Stay: Payer: 59 | Admitting: Hematology

## 2023-02-05 ENCOUNTER — Ambulatory Visit (HOSPITAL_COMMUNITY): Admission: RE | Admit: 2023-02-05 | Payer: 59 | Source: Ambulatory Visit

## 2023-02-06 ENCOUNTER — Other Ambulatory Visit: Payer: Self-pay

## 2023-02-07 NOTE — Telephone Encounter (Signed)
Called patient and advised her to go the the Emergency Department today and not to wait until Sunday or Monday afternoon to go. She is agreeable with plan and will call EMS to pick her up today.

## 2023-02-09 ENCOUNTER — Emergency Department (HOSPITAL_COMMUNITY): Payer: 59

## 2023-02-09 ENCOUNTER — Inpatient Hospital Stay (HOSPITAL_COMMUNITY)
Admission: EM | Admit: 2023-02-09 | Discharge: 2023-02-12 | DRG: 389 | Disposition: A | Discharge status: Home Health | Payer: 59 | Attending: Internal Medicine | Admitting: Internal Medicine

## 2023-02-09 ENCOUNTER — Encounter (HOSPITAL_COMMUNITY): Payer: Self-pay | Admitting: Emergency Medicine

## 2023-02-09 ENCOUNTER — Other Ambulatory Visit: Payer: Self-pay

## 2023-02-09 DIAGNOSIS — F32A Depression, unspecified: Secondary | ICD-10-CM | POA: Diagnosis present

## 2023-02-09 DIAGNOSIS — R0602 Shortness of breath: Secondary | ICD-10-CM | POA: Diagnosis not present

## 2023-02-09 DIAGNOSIS — K449 Diaphragmatic hernia without obstruction or gangrene: Secondary | ICD-10-CM | POA: Diagnosis present

## 2023-02-09 DIAGNOSIS — E46 Unspecified protein-calorie malnutrition: Secondary | ICD-10-CM | POA: Diagnosis not present

## 2023-02-09 DIAGNOSIS — R52 Pain, unspecified: Secondary | ICD-10-CM

## 2023-02-09 DIAGNOSIS — R1084 Generalized abdominal pain: Principal | ICD-10-CM

## 2023-02-09 DIAGNOSIS — Z9889 Other specified postprocedural states: Secondary | ICD-10-CM

## 2023-02-09 DIAGNOSIS — Z7189 Other specified counseling: Secondary | ICD-10-CM | POA: Diagnosis not present

## 2023-02-09 DIAGNOSIS — E871 Hypo-osmolality and hyponatremia: Secondary | ICD-10-CM | POA: Diagnosis present

## 2023-02-09 DIAGNOSIS — Z803 Family history of malignant neoplasm of breast: Secondary | ICD-10-CM

## 2023-02-09 DIAGNOSIS — K5669 Other partial intestinal obstruction: Principal | ICD-10-CM | POA: Diagnosis present

## 2023-02-09 DIAGNOSIS — Z841 Family history of disorders of kidney and ureter: Secondary | ICD-10-CM

## 2023-02-09 DIAGNOSIS — Z91048 Other nonmedicinal substance allergy status: Secondary | ICD-10-CM

## 2023-02-09 DIAGNOSIS — E860 Dehydration: Secondary | ICD-10-CM | POA: Diagnosis present

## 2023-02-09 DIAGNOSIS — Z8049 Family history of malignant neoplasm of other genital organs: Secondary | ICD-10-CM

## 2023-02-09 DIAGNOSIS — R112 Nausea with vomiting, unspecified: Secondary | ICD-10-CM | POA: Diagnosis present

## 2023-02-09 DIAGNOSIS — R531 Weakness: Secondary | ICD-10-CM | POA: Diagnosis not present

## 2023-02-09 DIAGNOSIS — Z8543 Personal history of malignant neoplasm of ovary: Secondary | ICD-10-CM

## 2023-02-09 DIAGNOSIS — C563 Malignant neoplasm of bilateral ovaries: Secondary | ICD-10-CM | POA: Diagnosis present

## 2023-02-09 DIAGNOSIS — Z881 Allergy status to other antibiotic agents status: Secondary | ICD-10-CM

## 2023-02-09 DIAGNOSIS — K589 Irritable bowel syndrome without diarrhea: Secondary | ICD-10-CM | POA: Diagnosis present

## 2023-02-09 DIAGNOSIS — Z880 Allergy status to penicillin: Secondary | ICD-10-CM

## 2023-02-09 DIAGNOSIS — G43909 Migraine, unspecified, not intractable, without status migrainosus: Secondary | ICD-10-CM | POA: Diagnosis present

## 2023-02-09 DIAGNOSIS — Z66 Do not resuscitate: Secondary | ICD-10-CM | POA: Diagnosis not present

## 2023-02-09 DIAGNOSIS — Z8249 Family history of ischemic heart disease and other diseases of the circulatory system: Secondary | ICD-10-CM | POA: Diagnosis not present

## 2023-02-09 DIAGNOSIS — C569 Malignant neoplasm of unspecified ovary: Secondary | ICD-10-CM | POA: Diagnosis present

## 2023-02-09 DIAGNOSIS — R000 Tachycardia, unspecified: Secondary | ICD-10-CM | POA: Diagnosis not present

## 2023-02-09 DIAGNOSIS — D6481 Anemia due to antineoplastic chemotherapy: Secondary | ICD-10-CM | POA: Diagnosis present

## 2023-02-09 DIAGNOSIS — R180 Malignant ascites: Secondary | ICD-10-CM | POA: Diagnosis present

## 2023-02-09 DIAGNOSIS — R627 Adult failure to thrive: Secondary | ICD-10-CM | POA: Diagnosis present

## 2023-02-09 DIAGNOSIS — Z6831 Body mass index (BMI) 31.0-31.9, adult: Secondary | ICD-10-CM

## 2023-02-09 DIAGNOSIS — Z8379 Family history of other diseases of the digestive system: Secondary | ICD-10-CM

## 2023-02-09 DIAGNOSIS — M47817 Spondylosis without myelopathy or radiculopathy, lumbosacral region: Secondary | ICD-10-CM | POA: Diagnosis present

## 2023-02-09 DIAGNOSIS — C786 Secondary malignant neoplasm of retroperitoneum and peritoneum: Secondary | ICD-10-CM | POA: Diagnosis present

## 2023-02-09 DIAGNOSIS — Z87442 Personal history of urinary calculi: Secondary | ICD-10-CM

## 2023-02-09 DIAGNOSIS — Z96642 Presence of left artificial hip joint: Secondary | ICD-10-CM | POA: Diagnosis present

## 2023-02-09 DIAGNOSIS — Z515 Encounter for palliative care: Secondary | ICD-10-CM | POA: Diagnosis not present

## 2023-02-09 DIAGNOSIS — K760 Fatty (change of) liver, not elsewhere classified: Secondary | ICD-10-CM | POA: Diagnosis present

## 2023-02-09 DIAGNOSIS — R1115 Cyclical vomiting syndrome unrelated to migraine: Secondary | ICD-10-CM | POA: Diagnosis present

## 2023-02-09 DIAGNOSIS — Z825 Family history of asthma and other chronic lower respiratory diseases: Secondary | ICD-10-CM

## 2023-02-09 DIAGNOSIS — E440 Moderate protein-calorie malnutrition: Secondary | ICD-10-CM | POA: Diagnosis present

## 2023-02-09 DIAGNOSIS — Z888 Allergy status to other drugs, medicaments and biological substances status: Secondary | ICD-10-CM

## 2023-02-09 DIAGNOSIS — R188 Other ascites: Secondary | ICD-10-CM | POA: Diagnosis not present

## 2023-02-09 DIAGNOSIS — Z452 Encounter for adjustment and management of vascular access device: Secondary | ICD-10-CM | POA: Diagnosis not present

## 2023-02-09 DIAGNOSIS — K56609 Unspecified intestinal obstruction, unspecified as to partial versus complete obstruction: Secondary | ICD-10-CM | POA: Diagnosis present

## 2023-02-09 DIAGNOSIS — D630 Anemia in neoplastic disease: Secondary | ICD-10-CM | POA: Diagnosis present

## 2023-02-09 DIAGNOSIS — Z79899 Other long term (current) drug therapy: Secondary | ICD-10-CM

## 2023-02-09 DIAGNOSIS — F17210 Nicotine dependence, cigarettes, uncomplicated: Secondary | ICD-10-CM | POA: Diagnosis present

## 2023-02-09 DIAGNOSIS — E8809 Other disorders of plasma-protein metabolism, not elsewhere classified: Secondary | ICD-10-CM | POA: Diagnosis present

## 2023-02-09 DIAGNOSIS — Z833 Family history of diabetes mellitus: Secondary | ICD-10-CM

## 2023-02-09 DIAGNOSIS — Z818 Family history of other mental and behavioral disorders: Secondary | ICD-10-CM

## 2023-02-09 DIAGNOSIS — G893 Neoplasm related pain (acute) (chronic): Secondary | ICD-10-CM | POA: Diagnosis present

## 2023-02-09 DIAGNOSIS — R109 Unspecified abdominal pain: Secondary | ICD-10-CM | POA: Diagnosis present

## 2023-02-09 DIAGNOSIS — Z9851 Tubal ligation status: Secondary | ICD-10-CM

## 2023-02-09 DIAGNOSIS — Z9102 Food additives allergy status: Secondary | ICD-10-CM

## 2023-02-09 DIAGNOSIS — M722 Plantar fascial fibromatosis: Secondary | ICD-10-CM | POA: Diagnosis present

## 2023-02-09 DIAGNOSIS — Z636 Dependent relative needing care at home: Secondary | ICD-10-CM

## 2023-02-09 DIAGNOSIS — Z90710 Acquired absence of both cervix and uterus: Secondary | ICD-10-CM

## 2023-02-09 DIAGNOSIS — F419 Anxiety disorder, unspecified: Secondary | ICD-10-CM | POA: Diagnosis present

## 2023-02-09 DIAGNOSIS — R7401 Elevation of levels of liver transaminase levels: Secondary | ICD-10-CM

## 2023-02-09 DIAGNOSIS — R54 Age-related physical debility: Secondary | ICD-10-CM | POA: Diagnosis present

## 2023-02-09 DIAGNOSIS — Z9049 Acquired absence of other specified parts of digestive tract: Secondary | ICD-10-CM

## 2023-02-09 DIAGNOSIS — E785 Hyperlipidemia, unspecified: Secondary | ICD-10-CM | POA: Diagnosis present

## 2023-02-09 DIAGNOSIS — Z8262 Family history of osteoporosis: Secondary | ICD-10-CM

## 2023-02-09 DIAGNOSIS — K219 Gastro-esophageal reflux disease without esophagitis: Secondary | ICD-10-CM | POA: Diagnosis present

## 2023-02-09 DIAGNOSIS — Z634 Disappearance and death of family member: Secondary | ICD-10-CM

## 2023-02-09 DIAGNOSIS — Z885 Allergy status to narcotic agent status: Secondary | ICD-10-CM

## 2023-02-09 LAB — URINALYSIS, ROUTINE W REFLEX MICROSCOPIC
Bilirubin Urine: NEGATIVE
Glucose, UA: NEGATIVE mg/dL
Hgb urine dipstick: NEGATIVE
Ketones, ur: NEGATIVE mg/dL
Leukocytes,Ua: NEGATIVE
Nitrite: NEGATIVE
Protein, ur: NEGATIVE mg/dL
Specific Gravity, Urine: >1.046 — ABNORMAL HIGH (ref 1.005–1.030)
pH: 6 (ref 5.0–8.0)

## 2023-02-09 LAB — CBC WITH DIFFERENTIAL/PLATELET
Abs Immature Granulocytes: 0.17 10*3/uL — ABNORMAL HIGH (ref 0.00–0.07)
Basophils Absolute: 0 10*3/uL (ref 0.0–0.1)
Basophils Relative: 0 %
Eosinophils Absolute: 0 10*3/uL (ref 0.0–0.5)
Eosinophils Relative: 0 %
HCT: 29.9 % — ABNORMAL LOW (ref 36.0–46.0)
Hemoglobin: 9.3 g/dL — ABNORMAL LOW (ref 12.0–15.0)
Immature Granulocytes: 2 %
Lymphocytes Relative: 20 %
Lymphs Abs: 1.9 10*3/uL (ref 0.7–4.0)
MCH: 36 pg — ABNORMAL HIGH (ref 26.0–34.0)
MCHC: 31.1 g/dL (ref 30.0–36.0)
MCV: 115.9 fL — ABNORMAL HIGH (ref 80.0–100.0)
Monocytes Absolute: 1.1 10*3/uL — ABNORMAL HIGH (ref 0.1–1.0)
Monocytes Relative: 12 %
Neutro Abs: 6.5 10*3/uL (ref 1.7–7.7)
Neutrophils Relative %: 66 %
Platelets: 214 10*3/uL (ref 150–400)
RBC: 2.58 MIL/uL — ABNORMAL LOW (ref 3.87–5.11)
RDW: 28.7 % — ABNORMAL HIGH (ref 11.5–15.5)
WBC: 9.7 10*3/uL (ref 4.0–10.5)
nRBC: 0 % (ref 0.0–0.2)

## 2023-02-09 LAB — COMPREHENSIVE METABOLIC PANEL
ALT: 49 U/L — ABNORMAL HIGH (ref 0–44)
AST: 65 U/L — ABNORMAL HIGH (ref 15–41)
Albumin: 1.9 g/dL — ABNORMAL LOW (ref 3.5–5.0)
Alkaline Phosphatase: 536 U/L — ABNORMAL HIGH (ref 38–126)
Anion gap: 14 (ref 5–15)
BUN: 12 mg/dL (ref 6–20)
CO2: 26 mmol/L (ref 22–32)
Calcium: 7.4 mg/dL — ABNORMAL LOW (ref 8.9–10.3)
Chloride: 94 mmol/L — ABNORMAL LOW (ref 98–111)
Creatinine, Ser: 0.79 mg/dL (ref 0.44–1.00)
GFR, Estimated: >60 mL/min (ref >60)
Glucose, Bld: 89 mg/dL (ref 70–99)
Potassium: 4 mmol/L (ref 3.5–5.1)
Sodium: 134 mmol/L — ABNORMAL LOW (ref 135–145)
Total Bilirubin: 1.8 mg/dL — ABNORMAL HIGH (ref 0.0–1.2)
Total Protein: 5.1 g/dL — ABNORMAL LOW (ref 6.5–8.1)

## 2023-02-09 LAB — LIPASE, BLOOD: Lipase: 16 U/L (ref 11–51)

## 2023-02-09 MED ORDER — HYDROMORPHONE HCL 1 MG/ML IJ SOLN
1.0000 mg | Freq: Once | INTRAMUSCULAR | Status: AC
  Administered 2023-02-09: 1 mg via INTRAVENOUS
  Filled 2023-02-09: qty 1

## 2023-02-09 MED ORDER — ONDANSETRON HCL 4 MG/2ML IJ SOLN
4.0000 mg | Freq: Once | INTRAMUSCULAR | Status: AC
  Administered 2023-02-09: 4 mg via INTRAVENOUS
  Filled 2023-02-09: qty 2

## 2023-02-09 MED ORDER — SODIUM CHLORIDE 0.9 % IV BOLUS
1000.0000 mL | Freq: Once | INTRAVENOUS | Status: AC
  Administered 2023-02-09: 1000 mL via INTRAVENOUS

## 2023-02-09 MED ORDER — IOHEXOL 300 MG/ML  SOLN
100.0000 mL | Freq: Once | INTRAMUSCULAR | Status: AC | PRN
  Administered 2023-02-09: 100 mL via INTRAVENOUS

## 2023-02-09 MED ORDER — HYDROMORPHONE HCL 1 MG/ML IJ SOLN
2.0000 mg | Freq: Once | INTRAMUSCULAR | Status: AC
Start: 2023-02-09 — End: 2023-02-09
  Administered 2023-02-09: 2 mg via INTRAVENOUS
  Filled 2023-02-09: qty 2

## 2023-02-09 NOTE — H&P (Signed)
History and Physical    Patient: Michelle Holt:[id number] DOB: Mar 26, 1970 DOA: 02/09/2023 DOS: the patient was seen and examined on 02/10/2023 PCP: Ignatius Specking, MD  Patient coming from: Home  Chief Complaint:  Chief Complaint  Patient presents with   Abdominal Pain   HPI: Michelle Holt is a 53 y.o. 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  Patient was recently admitted from 01/14/2023 to 01/15/2023 due to malignant ascites s/p paracentesis.  Patient has not had any chemotherapy since 01/07/2023 due to being very sick from having nausea, vomiting, poor appetite whereby she has not been eating, loss of control of bowels.  She felt so weak that she could barely make it to her bathroom from her bedroom, so she called her oncologist on Friday, 1/24 and was asked to go to the ED.  She presented to the ED due to worsening weakness.  ED Course:  In the emergency department, pulse is 102 bpm, BP 91/51, other vital signs are within normal range.  Workup in the ED shows hemoglobin 9.3, hematocrit 29.9, MCV 115.9.  BMP shows sodium 134, potassium 4.0, chloride 94, bicarb 26, 8 glucose 89, BUN 12, creatinine 0.79, calcium 8.9, AST 65, ALT 49, ALP 536, total bilirubin 1.8, lipase 16, urinalysis was normal CT abdomen and pelvis with contrast showed intermediate grade small bowel obstruction with transitional segment in the central abdomen, probably mid ileal in location due to a thickened segment.  Mild to moderate ascites, less than on 12/24/2022.  Peritoneal carcinomatosis findings without overt progression.  Increased moderate to severe hepatic steatosis. Patient was treated with Dilaudid and Zofran, IV hydration was provided. Hospitalist was asked to admit patient for further evaluation and management.   Review of  Systems: Review of systems as noted in the HPI. All other systems reviewed and are negative.   Past Medical History:  Diagnosis Date   Anemia    Anxiety and depression    Arthritis of facet joints at multiple vertebral levels    L5-S1   Constipation    Dyslipidemia    Family history of breast cancer    Family history of uterine cancer    GERD (gastroesophageal reflux disease)    History of hiatal hernia    History of kidney stones    Insomnia    Irritable bowel syndrome    Migraine    Muscle tension headache    Neuropathy of finger    Ovarian carcinoma (HCC)    ovarian   Plantar fasciitis of right foot    Port-A-Cath in place 08/20/2018   Past Surgical History:  Procedure Laterality Date   ABDOMINAL HYSTERECTOMY     BIOPSY  10/27/2020   Procedure: BIOPSY;  Surgeon: Dolores Frame, MD;  [facility];  Service: Gastroenterology;;   CHOLECYSTECTOMY  2008   COLONOSCOPY N/A 08/13/2013   Procedure: COLONOSCOPY;  Surgeon: Malissa Hippo, MD;  [facility]: [facility];  Service: Endoscopy;  Laterality: N/A;  230-moved to 145 Ann to notify pt   COLONOSCOPY WITH PROPOFOL N/A 11/15/2020   Procedure: COLONOSCOPY WITH PROPOFOL;  Surgeon: Malissa Hippo, MD;  [facility]: [facility];  Service: Endoscopy;  Laterality: N/A;  1:40   ESOPHAGEAL DILATION N/A 02/28/2021   Procedure: ESOPHAGEAL DILATION;  Surgeon: Malissa Hippo, MD;  [facility];  Service: Endoscopy;  Laterality: N/A;   ESOPHAGOGASTRODUODENOSCOPY     ESOPHAGOGASTRODUODENOSCOPY (EGD) WITH PROPOFOL N/A 07/20/2018   Procedure: ESOPHAGOGASTRODUODENOSCOPY (EGD) WITH PROPOFOL;  Surgeon: Malissa Hippo, MD;  [facility];  Service: Endoscopy;  Laterality: N/A;  Possible esophageal dilation.   ESOPHAGOGASTRODUODENOSCOPY (EGD) WITH PROPOFOL N/A 10/27/2020   Procedure: ESOPHAGOGASTRODUODENOSCOPY (EGD) WITH PROPOFOL;  Surgeon: Dolores Frame, MD;  [facility];  Service:  Gastroenterology;  Laterality: N/A;  2:10, pt knows to arrive at 10:15   ESOPHAGOGASTRODUODENOSCOPY (EGD) WITH PROPOFOL N/A 02/28/2021   Procedure: ESOPHAGOGASTRODUODENOSCOPY (EGD) WITH PROPOFOL;  Surgeon: Malissa Hippo, MD;  [facility];  Service: Endoscopy;  Laterality: N/A;  200 ASA 1   ESOPHAGOGASTRODUODENOSCOPY (EGD) WITH PROPOFOL N/A 03/29/2022   Procedure: ESOPHAGOGASTRODUODENOSCOPY (EGD) WITH PROPOFOL;  Surgeon: Dolores Frame, MD;  [facility];  Service: Gastroenterology;  Laterality: N/A;  2:00 pm, asa 1-2   GIVENS CAPSULE STUDY N/A 11/24/2020   Procedure: GIVENS CAPSULE STUDY;  Surgeon: Malissa Hippo, MD;  [facility];  Service: Endoscopy;  Laterality: N/A;  7:30   IR ANGIOGRAM SELECTIVE EACH ADDITIONAL VESSEL  08/01/2018   IR ANGIOGRAM VISCERAL SELECTIVE  08/01/2018   IR EMBO ART  VEN HEMORR LYMPH EXTRAV  INC GUIDE ROADMAPPING  08/01/2018   IR IMAGING GUIDED PORT INSERTION  08/20/2018   IR PERC PLEURAL DRAIN W/INDWELL CATH W/IMG GUIDE  07/08/2018   IR THORACENTESIS ASP PLEURAL SPACE W/IMG GUIDE  07/07/2018   IR US GUIDE VASC ACCESS RIGHT  08/01/2018   PLEURAL EFFUSION DRAINAGE Left 07/13/2018   Procedure: DRAINAGE OF LOCULATED PLEURAL EFFUSION;  Surgeon: Kerin Perna, MD;  Location: [facility] OR;  Service: Thoracic;  Laterality: Left;   POLYPECTOMY  11/15/2020   Procedure: POLYPECTOMY;  Surgeon: Malissa Hippo, MD;  [facility];  Service: Endoscopy;;   REMOVAL OF PLEURAL DRAINAGE CATHETER Left 08/20/2018   Procedure: REMOVAL OF PLEURAL DRAINAGE CATHETER;  Surgeon: Kerin Perna, MD;  Location[facility] OR;  Service: Thoracic;  Laterality: Left;   REMOVAL OF PLEURAL DRAINAGE CATHETER Left 08/20/2018   Procedure: REMOVAL OF PLEURAL DRAINAGE CATHETER;  Surgeon: Kerin Perna, MD;  Location: [facility] OR;  Service: Thoracic;  Laterality: Left;   TALC PLEURODESIS Left 07/13/2018   Procedure: Talc Pleuradesis;  Surgeon: Donata Clay, Theron Arista, MD;   Location: [facility] OR;  Service: Thoracic;  Laterality: Left;   TOTAL HIP ARTHROPLASTY Left 06/05/2020   Procedure: LEFT TOTAL HIP ARTHROPLASTY ANTERIOR APPROACH;  Surgeon: Tarry Kos, MD;  Location: [facility] OR;  Service: Orthopedics;  Laterality: Left;  3-C   TUBAL LIGATION Bilateral    UTERINE ABLATION     VIDEO ASSISTED THORACOSCOPY Left 07/13/2018   Procedure: VIDEO ASSISTED THORACOSCOPY;  Surgeon: Kerin Perna, MD;  Location: [facility] OR;  Service: Thoracic;  Laterality: Left;    Social History:  reports that she has been smoking cigarettes. She started smoking about 21 years ago. She has a 8.6 pack-year smoking history. She has never used smokeless tobacco. She reports that she does not currently use alcohol after a past usage of about 1.0 standard drink of alcohol per week. She reports that she does not use drugs.   Allergies  Allergen Reactions   Morphine And Codeine Itching   Nickel Itching   Nortriptyline Other (See Comments)    Significant weight gain   Topamax [Topiramate] Diarrhea and Nausea Only   Xanax [Alprazolam] Other (See Comments)    "  Can't wake up"   Actifed Cold-Allergy [Chlorpheniramine-Phenyleph Er] Rash and Other (See Comments)    Red dye only   Amoxicillin Rash   Codeine Hives   Erythromycin Rash   Gemzar [Gemcitabine] Other (See Comments)    Feeling flushed, red in the face   Penicillins Rash        Red Dye Rash   Sudafed [Pseudoephedrine Hcl] Rash and Other (See Comments)    Red dye only    Family History  Problem Relation Age of Onset   Depression Mother    Hypertension Mother    Obesity Mother    Diabetes Mother    Kidney disease Mother    Peripheral vascular disease Father    Atrial fibrillation Father    Crohn's disease Sister    Uterine cancer Sister 54       maternal half sister   COPD Brother    Osteoporosis Brother    Breast cancer Maternal Aunt 50   Colon cancer Neg Hx      Prior to Admission medications   Medication Sig Start Date End  Date Taking? Authorizing Provider  albuterol (VENTOLIN HFA) 108 (90 Base) MCG/ACT inhaler Inhale 2 puffs into the lungs every 6 (six) hours as needed for wheezing or shortness of breath.    [provider]  dexamethasone (DECADRON) 4 MG tablet Take 1 tablet (4 mg total) by mouth daily. 01/17/23   Doreatha Massed, MD  diazepam (VALIUM) 5 MG tablet Take 1 tablet (5 mg total) by mouth at bedtime. 09/05/22   Doreatha Massed, MD  dicyclomine (BENTYL) 20 MG tablet Take 1 tablet (20 mg total) by mouth every 6 (six) hours as needed for spasms. 01/07/23   Doreatha Massed, MD  gabapentin (NEURONTIN) 100 MG capsule TAKE THREE CAPSULES BY MOUTH DAILY AT BEDTIME Patient taking differently: Take 300 mg by mouth 2 (two) times daily as needed (pain). 01/13/23   Doreatha Massed, MD  HYDROmorphone (DILAUDID) 8 MG tablet Take 1 tablet (8 mg total) by mouth every 4 (four) hours as needed (Break[facility] Pain). 01/13/23   Doreatha Massed, MD  magic mouthwash w/lidocaine SOLN Take 5 mLs by mouth 4 (four) times daily as needed for mouth pain. Suspension contains equal amounts of Maalox Extra Strength, nystatin, diphenhydramine and lidocaine. 01/16/23   Doreatha Massed, MD  naloxegol oxalate (MOVANTIK) 25 MG TABS tablet Take 1 tablet (25 mg total) by mouth daily. 12/30/22   Edsel Petrin, DO  omeprazole (PRILOSEC) 40 MG capsule Take 1 capsule (40 mg total) by mouth daily. 01/17/23   Doreatha Massed, MD  ondansetron (ZOFRAN-ODT) 4 MG disintegrating tablet Take 1 tablet (4 mg total) by mouth every 8 (eight) hours as needed for nausea or vomiting. 01/22/23   Doreatha Massed, MD  oxyCODONE (OXYCONTIN) 60 MG 12 hr tablet Take 1 tablet by mouth every 12 (twelve) hours as needed. 01/20/23   Doreatha Massed, MD  oxyCODONE ER 36 MG C12A Take 1 capsule (36 mg total) by mouth 4 (four) times daily. 12/30/22   Meredeth Ide, MD    Physical Exam: BP (!) 106/59 (BP Location: Right Arm)    Pulse (!) 101   Temp 98.3 F (36.8 C) (Oral)   Resp 20   Ht 5\' 4"  (1.626 m)   Wt 76.3 kg   LMP  (LMP Unknown)   SpO2 99%   BMI 28.87 kg/m   General: 53 y.o. year-old female well developed well nourished in no acute distress.  Alert and oriented x3.  HEENT: NCAT, EOMI Neck: Supple, trachea medial Cardiovascular: Tachycardia.  Regular rate and rhythm with no rubs or gallops.  No thyromegaly or JVD noted.  No lower extremity edema. 2/4 pulses in all 4 extremities. Respiratory: Clear to auscultation with no wheezes or rales. Good inspiratory effort. Abdomen: Soft, tender to palpation.  Nondistended with normal bowel sounds x4 quadrants. Muskuloskeletal: No cyanosis, clubbing or edema noted bilaterally Neuro: CN II-XII intact, strength 5/5 x 4, sensation, reflexes intact Skin: No ulcerative lesions noted or rashes Psychiatry: Judgement and insight appear normal. Mood is appropriate for condition and setting          Labs on Admission:  Basic Metabolic Panel: Recent Labs  Lab 02/09/23 2137  NA 134*  K 4.0  CL 94*  CO2 26  GLUCOSE 89  BUN 12  CREATININE 0.79  CALCIUM 7.4*   Liver Function Tests: Recent Labs  Lab 02/09/23 2137  AST 65*  ALT 49*  ALKPHOS 536*  BILITOT 1.8*  PROT 5.1*  ALBUMIN 1.9*   Recent Labs  Lab 02/09/23 2137  LIPASE 16   No results for input(s): "AMMONIA" in the last 168 hours. CBC: Recent Labs  Lab 02/09/23 2137  WBC 9.7  NEUTROABS 6.5  HGB 9.3*  HCT 29.9*  MCV 115.9*  PLT 214   Cardiac Enzymes: No results for input(s): "CKTOTAL", "CKMB", "CKMBINDEX", "TROPONINI" in the last 168 hours.  BNP (last 3 results) No results for input(s): "BNP" in the last 8760 hours.  ProBNP (last 3 results) No results for input(s): "PROBNP" in the last 8760 hours.  CBG: No results for input(s): "GLUCAP" in the last 168 hours.  Radiological Exams on Admission: CT ABDOMEN PELVIS W CONTRAST Result Date: 02/09/2023 CLINICAL DATA:  Abdominal pain,  nausea, and vomiting x2 months. Patient has ovarian cancer with metastases. Ultrasound therapeutic paracentesis x3 this month, last on 01/22/2023 with 600 mL clear yellow fluid removed. EXAM: CT ABDOMEN AND PELVIS WITH CONTRAST TECHNIQUE: Multidetector CT imaging of the abdomen and pelvis was performed using the standard protocol following bolus administration of intravenous contrast. RADIATION DOSE REDUCTION: This exam was performed according to the departmental dose-optimization program which includes automated exposure control, adjustment of the mA and/or kV according to patient size and/or use of iterative reconstruction technique. CONTRAST:  OMNIPAQUE IOHEXOL 300 MG/ML  SOLN COMPARISON:  Numerous prior CTs. The 2 most recent are a CT abdomen pelvis with contrast 12/24/2022 and CTA chest, abdomen and pelvis from 12/15/2022. FINDINGS: Lower chest: An infusion catheter again noted terminating about the superior cavoatrial junction. The cardiac size is normal. There is a small hiatal hernia. Mild elevation right hemidiaphragm with lung bases clear. Hepatobiliary: Moderate to severe hepatic steatosis is increased in the interval. There is no mass enhancement. Gallbladder is absent as before with stable prominent common bile duct measuring 11 mm. Pancreas: Partially atrophic. No focal abnormality. No inflammation. Spleen: Stable 7 cm splenic cystic lesion with patchy calcifications. Lobulated appearance of the spleen. No splenomegaly. Embolization coils again noted at the splenic hilum. No new abnormality. Adrenals/Urinary Tract: Adrenal glands are unremarkable. Kidneys are normal, without renal calculi, focal lesion, or hydronephrosis. Bladder is unremarkable. Stomach/Bowel: Chronic thickened folds in the upper to mid stomach. Mild increase parent fluid distention. Increased dilatation in duodenum and proximal to mid small bowel up to 4.4 cm caliber. There are thickened folds in multiple dilated and nondilated  segments. There is a thickened segment in the central abdomen best seen on 2: 34-37 which is believed  to be the transitional segment causing intermediate grade obstruction. Decompressed segments are present downstream. This is probably mid ileal in location. An appendix is not seen. There is mild fecal stasis ascending and transverse colon. Rest of the large bowel is largely contracted. Vascular/Lymphatic: Aortic atherosclerosis. No enlarged abdominal or pelvic lymph nodes. Reproductive: Status post hysterectomy. No adnexal masses. Multiple pelvic phleboliths. Other: Mild-to-moderate free fluid, less than on 12/24/2022. There is enhancement of the peritoneal reflections consistent with peritoneal carcinomatosis, ranging from linear to nodular enhancement as before. No overt progression in this is seen. There are minimal layering presacral ascites as well. There is no free hemorrhage or free air.  No incarcerated hernia. Musculoskeletal: Left hip replacement. Grade 1 degenerative anterolisthesis L4-5 with advanced facet hypertrophy and acquired spinal stenosis. Osteopenia.  No regional bone metastasis is seen. IMPRESSION: 1. Intermediate grade small-bowel obstruction with transitional segment in the central abdomen, probably mid ileal in location, due to a thickened segment. No pneumatosis. 2. Mild-to-moderate ascites, less than on 12/24/2022. 3. Peritoneal carcinomatosis findings without overt progression. 4. Increased moderate to severe hepatic steatosis. 5. Stable 7 cm splenic cystic lesion with patchy calcifications. 6. Aortic atherosclerosis. 7. Small hiatal hernia. 8. Osteopenia and degenerative change. Aortic Atherosclerosis (ICD10-I70.0). Electronically Signed   By: Almira Bar M.D.   On: 02/09/2023 23:03   DG Chest Portable 1 View Result Date: 02/09/2023 CLINICAL DATA:  Shortness of breath with abdominal pain. EXAM: PORTABLE CHEST 1 VIEW COMPARISON:  Chest x-ray 11/12/2021 FINDINGS: Right chest port  catheter tip projects over the distal SVC. The heart size and mediastinal contours are within normal limits. Both lungs are clear. The visualized skeletal structures are unremarkable. IMPRESSION: No active disease. Electronically Signed   By: Darliss Cheney M.D.   On: 02/09/2023 21:58    EKG: I independently viewed the EKG done and my findings are as followed: EKG was not done in the ED  Assessment/Plan Present on Admission:  SBO (small bowel obstruction) (HCC)  Abdominal pain  Nausea with vomiting  GERD (gastroesophageal reflux disease)  Malignant neoplasm of ovary (HCC)  Principal Problem:   SBO (small bowel obstruction) (HCC) Active Problems:   GERD (gastroesophageal reflux disease)   Nausea with vomiting   Abdominal pain   Malignant neoplasm of ovary (HCC)   Hypoalbuminemia due to protein-calorie malnutrition (HCC)   Failure to thrive in adult  Small bowel obstruction possibly due to peritoneal carcinomatosis No indication for NG tube insertion at this time Continue NPO at this time with plan to advance diet as tolerated Continue IV hydration Continue IV Dilaudid p.r.n. for moderate/severe pain Continue Zofran p.r.n. for nausea/vomiting General Surgery (Dr. Lovell Sheehan) was consulted and there was no indication for any surgical intervention  Intractable abdominal pain This is possibly due to cancer associated pain Continue IV Dilaudid as needed  Nausea and vomiting Continue Zofran as needed  Transaminasemia AST 65, ALT 49, ALP 536 This is probably secondary to patient's increased moderate to severe hepatic steatosis  Hypoalbuminemia possible secondary to severe protein calorie malnutrition Failure to thrive in adult Albumin 1.9, consider protein supplement when patient resumes oral intake Dietitian will be consulted  Stage IV ovarian cancer Patient follows with Dr. Ellin Saba  GERD Continue Protonix  Goals of care Palliative care will be consulted  DVT  prophylaxis: Lovenox  Code Status: Full code  Family Communication: A friend at bedside (all questions answered to satisfaction)  Consults: Palliative care  Severity of Illness: The appropriate patient status for this patient  is INPATIENT. Inpatient status is judged to be reasonable and necessary in order to provide the required intensity of service to ensure the patient's safety. The patient's presenting symptoms, physical exam findings, and initial radiographic and laboratory data in the context of their chronic comorbidities is felt to place them at high risk for further clinical deterioration. Furthermore, it is not anticipated that the patient will be medically stable for discharge from the hospital within 2 midnights of admission.   * I certify that at the point of admission it is my clinical judgment that the patient will require inpatient hospital care spanning beyond 2 midnights from the point of admission due to high intensity of service, high risk for further deterioration and high frequency of surveillance required.*  Author: Frankey Shown, DO 02/10/2023 3:59 AM  For on call review [email or web address].

## 2023-02-09 NOTE — ED Provider Notes (Signed)
[place] EMERGENCY DEPARTMENT AT [facility] Provider Note   CSN: [id number] Arrival date & time: 02/09/23  2041     History  Chief Complaint  Patient presents with   Abdominal Pain    Michelle Holt is a 53 y.o. female.  Pt is a 53 yo female with pmhx significant for recurrent, metastatic ovarian carcinoma, migraines, gerd and depression.  Pt has not had any chemo since 12/24 because she's been so sick.  She was admitted from 12/31-1/1 for paracentesis.  Since she's been home, she has been miserable.  She's been having n/v.  She does not have control of her bowels.  She has eaten almost nothing.  She called her oncologist on Friday, 1/24 and was told to come to the ED then.  She tried to make it through the weekend, but was no longer able to walk across the room.         Home Medications Prior to Admission medications   Medication Sig Start Date End Date Taking? Authorizing Provider  albuterol (VENTOLIN HFA) 108 (90 Base) MCG/ACT inhaler Inhale 2 puffs into the lungs every 6 (six) hours as needed for wheezing or shortness of breath.    [provider]  dexamethasone (DECADRON) 4 MG tablet Take 1 tablet (4 mg total) by mouth daily. 01/17/23   Doreatha Massed, MD  diazepam (VALIUM) 5 MG tablet Take 1 tablet (5 mg total) by mouth at bedtime. 09/05/22   Doreatha Massed, MD  dicyclomine (BENTYL) 20 MG tablet Take 1 tablet (20 mg total) by mouth every 6 (six) hours as needed for spasms. 01/07/23   Doreatha Massed, MD  gabapentin (NEURONTIN) 100 MG capsule TAKE THREE CAPSULES BY MOUTH DAILY AT BEDTIME Patient taking differently: Take 300 mg by mouth 2 (two) times daily as needed (pain). 01/13/23   Doreatha Massed, MD  HYDROmorphone (DILAUDID) 8 MG tablet Take 1 tablet (8 mg total) by mouth every 4 (four) hours as needed (Breakthrough Pain). 01/13/23   Doreatha Massed, MD  magic mouthwash w/lidocaine SOLN Take 5 mLs by mouth 4 (four) times daily  as needed for mouth pain. Suspension contains equal amounts of Maalox Extra Strength, nystatin, diphenhydramine and lidocaine. 01/16/23   Doreatha Massed, MD  naloxegol oxalate (MOVANTIK) 25 MG TABS tablet Take 1 tablet (25 mg total) by mouth daily. 12/30/22   Edsel Petrin, DO  omeprazole (PRILOSEC) 40 MG capsule Take 1 capsule (40 mg total) by mouth daily. 01/17/23   Doreatha Massed, MD  ondansetron (ZOFRAN-ODT) 4 MG disintegrating tablet Take 1 tablet (4 mg total) by mouth every 8 (eight) hours as needed for nausea or vomiting. 01/22/23   Doreatha Massed, MD  oxyCODONE (OXYCONTIN) 60 MG 12 hr tablet Take 1 tablet by mouth every 12 (twelve) hours as needed. 01/20/23   Doreatha Massed, MD  oxyCODONE ER 36 MG C12A Take 1 capsule (36 mg total) by mouth 4 (four) times daily. 12/30/22   Meredeth Ide, MD      Allergies    Morphine and codeine, Nickel, Nortriptyline, Topamax [topiramate], Xanax [alprazolam], Actifed cold-allergy [chlorpheniramine-phenyleph er], Amoxicillin, Codeine, Erythromycin, Gemzar [gemcitabine], Penicillins, Red dye, and Sudafed [pseudoephedrine hcl]    Review of Systems   Review of Systems  Constitutional:  Positive for appetite change.  Gastrointestinal:  Positive for abdominal pain, nausea and vomiting.  All other systems reviewed and are negative.   Physical Exam Updated Vital Signs BP 90/63   Pulse 99   Temp 97.9 F (36.6 C) (  Oral)   Resp 18   Ht 5\' 4"  (1.626 m)   Wt 83 kg   LMP  (LMP Unknown)   SpO2 95%   BMI 31.41 kg/m  Physical Exam Vitals and nursing note reviewed.  Constitutional:      Appearance: She is well-developed. She is ill-appearing.  HENT:     Head: Normocephalic and atraumatic.     Mouth/Throat:     Mouth: Mucous membranes are moist.     Pharynx: Oropharynx is clear.  Eyes:     Extraocular Movements: Extraocular movements intact.     Pupils: Pupils are equal, round, and reactive to light.  Cardiovascular:     Rate  and Rhythm: Regular rhythm. Tachycardia present.     Heart sounds: Normal heart sounds.  Pulmonary:     Effort: Pulmonary effort is normal.     Breath sounds: Normal breath sounds.  Abdominal:     Tenderness: There is generalized abdominal tenderness.     Comments: Ascites noted  Skin:    General: Skin is warm.     Capillary Refill: Capillary refill takes less than 2 seconds.  Neurological:     General: No focal deficit present.     Mental Status: She is alert and oriented to person, place, and time.  Psychiatric:        Mood and Affect: Mood normal.        Behavior: Behavior normal.     ED Results / Procedures / Treatments   Labs (all labs ordered are listed, but only abnormal results are displayed) Labs Reviewed  CBC WITH DIFFERENTIAL/PLATELET - Abnormal; Notable for the following components:      Result Value   RBC 2.58 (*)    Hemoglobin 9.3 (*)    HCT 29.9 (*)    MCV 115.9 (*)    MCH 36.0 (*)    RDW 28.7 (*)    Monocytes Absolute 1.1 (*)    Abs Immature Granulocytes 0.17 (*)    All other components within normal limits  COMPREHENSIVE METABOLIC PANEL - Abnormal; Notable for the following components:   Sodium 134 (*)    Chloride 94 (*)    Calcium 7.4 (*)    Total Protein 5.1 (*)    Albumin 1.9 (*)    AST 65 (*)    ALT 49 (*)    Alkaline Phosphatase 536 (*)    Total Bilirubin 1.8 (*)    All other components within normal limits  LIPASE, BLOOD  URINALYSIS, ROUTINE W REFLEX MICROSCOPIC    EKG None  Radiology CT ABDOMEN PELVIS W CONTRAST Result Date: 02/09/2023 CLINICAL DATA:  Abdominal pain, nausea, and vomiting x2 months. Patient has ovarian cancer with metastases. Ultrasound therapeutic paracentesis x3 this month, last on 01/22/2023 with 600 mL clear yellow fluid removed. EXAM: CT ABDOMEN AND PELVIS WITH CONTRAST TECHNIQUE: Multidetector CT imaging of the abdomen and pelvis was performed using the standard protocol following bolus administration of intravenous  contrast. RADIATION DOSE REDUCTION: This exam was performed according to the departmental dose-optimization program which includes automated exposure control, adjustment of the mA and/or kV according to patient size and/or use of iterative reconstruction technique. CONTRAST:  OMNIPAQUE IOHEXOL 300 MG/ML  SOLN COMPARISON:  Numerous prior CTs. The 2 most recent are a CT abdomen pelvis with contrast 12/24/2022 and CTA chest, abdomen and pelvis from 12/15/2022. FINDINGS: Lower chest: An infusion catheter again noted terminating about the superior cavoatrial junction. The cardiac size is normal. There is a small  hiatal hernia. Mild elevation right hemidiaphragm with lung bases clear. Hepatobiliary: Moderate to severe hepatic steatosis is increased in the interval. There is no mass enhancement. Gallbladder is absent as before with stable prominent common bile duct measuring 11 mm. Pancreas: Partially atrophic. No focal abnormality. No inflammation. Spleen: Stable 7 cm splenic cystic lesion with patchy calcifications. Lobulated appearance of the spleen. No splenomegaly. Embolization coils again noted at the splenic hilum. No new abnormality. Adrenals/Urinary Tract: Adrenal glands are unremarkable. Kidneys are normal, without renal calculi, focal lesion, or hydronephrosis. Bladder is unremarkable. Stomach/Bowel: Chronic thickened folds in the upper to mid stomach. Mild increase parent fluid distention. Increased dilatation in duodenum and proximal to mid small bowel up to 4.4 cm caliber. There are thickened folds in multiple dilated and nondilated segments. There is a thickened segment in the central abdomen best seen on 2: 34-37 which is believed to be the transitional segment causing intermediate grade obstruction. Decompressed segments are present downstream. This is probably mid ileal in location. An appendix is not seen. There is mild fecal stasis ascending and transverse colon. Rest of the large bowel is largely  contracted. Vascular/Lymphatic: Aortic atherosclerosis. No enlarged abdominal or pelvic lymph nodes. Reproductive: Status post hysterectomy. No adnexal masses. Multiple pelvic phleboliths. Other: Mild-to-moderate free fluid, less than on 12/24/2022. There is enhancement of the peritoneal reflections consistent with peritoneal carcinomatosis, ranging from linear to nodular enhancement as before. No overt progression in this is seen. There are minimal layering presacral ascites as well. There is no free hemorrhage or free air.  No incarcerated hernia. Musculoskeletal: Left hip replacement. Grade 1 degenerative anterolisthesis L4-5 with advanced facet hypertrophy and acquired spinal stenosis. Osteopenia.  No regional bone metastasis is seen. IMPRESSION: 1. Intermediate grade small-bowel obstruction with transitional segment in the central abdomen, probably mid ileal in location, due to a thickened segment. No pneumatosis. 2. Mild-to-moderate ascites, less than on 12/24/2022. 3. Peritoneal carcinomatosis findings without overt progression. 4. Increased moderate to severe hepatic steatosis. 5. Stable 7 cm splenic cystic lesion with patchy calcifications. 6. Aortic atherosclerosis. 7. Small hiatal hernia. 8. Osteopenia and degenerative change. Aortic Atherosclerosis (ICD10-I70.0). Electronically Signed   By: Almira Bar M.D.   On: 02/09/2023 23:03   DG Chest Portable 1 View Result Date: 02/09/2023 CLINICAL DATA:  Shortness of breath with abdominal pain. EXAM: PORTABLE CHEST 1 VIEW COMPARISON:  Chest x-ray 11/12/2021 FINDINGS: Right chest port catheter tip projects over the distal SVC. The heart size and mediastinal contours are within normal limits. Both lungs are clear. The visualized skeletal structures are unremarkable. IMPRESSION: No active disease. Electronically Signed   By: Darliss Cheney M.D.   On: 02/09/2023 21:58    Procedures Procedures    Medications Ordered in ED Medications  sodium chloride 0.9  % bolus 1,000 mL (0 mLs Intravenous Stopped 02/09/23 2255)  ondansetron (ZOFRAN) injection 4 mg (4 mg Intravenous Given 02/09/23 2110)  HYDROmorphone (DILAUDID) injection 1 mg (1 mg Intravenous Given 02/09/23 2110)  HYDROmorphone (DILAUDID) injection 2 mg (2 mg Intravenous Given 02/09/23 2240)  iohexol (OMNIPAQUE) 300 MG/ML solution 100 mL (100 mLs Intravenous Contrast Given 02/09/23 2227)    ED Course/ Medical Decision Making/ A&P                                 Medical Decision Making Amount and/or Complexity of Data Reviewed Labs: ordered. Radiology: ordered.  Risk Prescription drug management. Decision regarding hospitalization.  This patient presents to the ED for concern of abd pain, n/v, this involves an extensive number of treatment options, and is a complaint that carries with it a high risk of complications and morbidity.  The differential diagnosis includes failure to thrive, electrolyte abn, dehydration, infection   Co morbidities that complicate the patient evaluation  ecurrent, metastatic ovarian carcinoma, migraines, gerd and depression   Additional history obtained:  Additional history obtained from epic chart review External records from outside source obtained and reviewed including EMS report   Lab Tests:  I Ordered, and personally interpreted labs.  The pertinent results include:  cbc with hgb 9.3 (hgb 11.3 on 1/3); cmp with chronically elevated LFTs, lip nl at 16   Imaging Studies ordered:  I ordered imaging studies including ct abd/pelvis, cxr  I independently visualized and interpreted imaging which showed  CXR: No active disease.  CT abd/pelvis: Intermediate grade small-bowel obstruction with transitional  segment in the central abdomen, probably mid ileal in location, due  to a thickened segment.  No pneumatosis.  2. Mild-to-moderate ascites, less than on 12/24/2022.  3. Peritoneal carcinomatosis findings without overt progression.  4.  Increased moderate to severe hepatic steatosis.  5. Stable 7 cm splenic cystic lesion with patchy calcifications.  6. Aortic atherosclerosis.  7. Small hiatal hernia.  8. Osteopenia and degenerative change.    Aortic Atherosclerosis (ICD10-I70.0).   I agree with the radiologist interpretation   Cardiac Monitoring:  The patient was maintained on a cardiac monitor.  I personally viewed and interpreted the cardiac monitored which showed an underlying rhythm of: st   Medicines ordered and prescription drug management:  I ordered medication including ivfs/zofran/dilaudid  for sx  Reevaluation of the patient after these medicines showed that the patient improved I have reviewed the patients home medicines and have made adjustments as needed   Test Considered:  ct   Critical Interventions:  Ivfs/pain control   Consultations Obtained:  I requested consultation with the surgeon (Dr. Lovell Sheehan),  and discussed lab and imaging findings as well as pertinent plan - he said there is not much they do with a sbo from peritoneal carcinomatosis.  He said NG is optional. Pt d/w Dr. Thomes Dinning (triad) for admission.   Problem List / ED Course:  Metastatic ovarian cancer and FTT +sbo:  pt will need admission for pain and nausea control.   Pt declined NG.   Reevaluation:  After the interventions noted above, I reevaluated the patient and found that they have :improved   Social Determinants of Health:  Lives at home   Dispostion:  After consideration of the diagnostic results and the patients response to treatment, I feel that the patent would benefit from admission.          Final Clinical Impression(s) / ED Diagnoses Final diagnoses:  Generalized abdominal pain  Intractable pain  Nausea and vomiting, unspecified vomiting type  Malignant neoplasm of ovary, unspecified laterality (HCC)  Malignant ascites  SBO (small bowel obstruction) (HCC)    Rx / DC Orders ED  Discharge Orders     None         Jacalyn Lefevre, MD 02/09/23 2335

## 2023-02-09 NOTE — ED Triage Notes (Signed)
Pt c/o abd pain and n/v x 2 months. Pt has ovarian cancer with mets.

## 2023-02-10 ENCOUNTER — Encounter (HOSPITAL_COMMUNITY): Payer: Self-pay | Admitting: Internal Medicine

## 2023-02-10 ENCOUNTER — Other Ambulatory Visit: Payer: Self-pay

## 2023-02-10 DIAGNOSIS — R180 Malignant ascites: Secondary | ICD-10-CM

## 2023-02-10 DIAGNOSIS — E46 Unspecified protein-calorie malnutrition: Secondary | ICD-10-CM | POA: Insufficient documentation

## 2023-02-10 DIAGNOSIS — Z7189 Other specified counseling: Secondary | ICD-10-CM | POA: Diagnosis not present

## 2023-02-10 DIAGNOSIS — R1084 Generalized abdominal pain: Secondary | ICD-10-CM

## 2023-02-10 DIAGNOSIS — C569 Malignant neoplasm of unspecified ovary: Secondary | ICD-10-CM

## 2023-02-10 DIAGNOSIS — Z515 Encounter for palliative care: Secondary | ICD-10-CM

## 2023-02-10 DIAGNOSIS — K56609 Unspecified intestinal obstruction, unspecified as to partial versus complete obstruction: Secondary | ICD-10-CM | POA: Diagnosis not present

## 2023-02-10 DIAGNOSIS — R112 Nausea with vomiting, unspecified: Secondary | ICD-10-CM

## 2023-02-10 DIAGNOSIS — R627 Adult failure to thrive: Secondary | ICD-10-CM | POA: Insufficient documentation

## 2023-02-10 LAB — COMPREHENSIVE METABOLIC PANEL
ALT: 47 U/L — ABNORMAL HIGH (ref 0–44)
AST: 63 U/L — ABNORMAL HIGH (ref 15–41)
Albumin: 1.9 g/dL — ABNORMAL LOW (ref 3.5–5.0)
Alkaline Phosphatase: 526 U/L — ABNORMAL HIGH (ref 38–126)
Anion gap: 10 (ref 5–15)
BUN: 10 mg/dL (ref 6–20)
CO2: 28 mmol/L (ref 22–32)
Calcium: 7.3 mg/dL — ABNORMAL LOW (ref 8.9–10.3)
Chloride: 96 mmol/L — ABNORMAL LOW (ref 98–111)
Creatinine, Ser: 0.73 mg/dL (ref 0.44–1.00)
GFR, Estimated: >60 mL/min (ref >60)
Glucose, Bld: 108 mg/dL — ABNORMAL HIGH (ref 70–99)
Potassium: 4.1 mmol/L (ref 3.5–5.1)
Sodium: 134 mmol/L — ABNORMAL LOW (ref 135–145)
Total Bilirubin: 1.5 mg/dL — ABNORMAL HIGH (ref 0.0–1.2)
Total Protein: 5 g/dL — ABNORMAL LOW (ref 6.5–8.1)

## 2023-02-10 LAB — PHOSPHORUS: Phosphorus: 2.9 mg/dL (ref 2.5–4.6)

## 2023-02-10 LAB — CBC
HCT: 27.1 % — ABNORMAL LOW (ref 36.0–46.0)
Hemoglobin: 8.4 g/dL — ABNORMAL LOW (ref 12.0–15.0)
MCH: 36.1 pg — ABNORMAL HIGH (ref 26.0–34.0)
MCHC: 31 g/dL (ref 30.0–36.0)
MCV: 116.3 fL — ABNORMAL HIGH (ref 80.0–100.0)
Platelets: 202 10*3/uL (ref 150–400)
RBC: 2.33 MIL/uL — ABNORMAL LOW (ref 3.87–5.11)
WBC: 9.5 10*3/uL (ref 4.0–10.5)
nRBC: 0.2 % (ref 0.0–0.2)

## 2023-02-10 LAB — MAGNESIUM: Magnesium: 1.1 mg/dL — ABNORMAL LOW (ref 1.7–2.4)

## 2023-02-10 MED ORDER — MAGNESIUM SULFATE 4 GM/100ML IV SOLN
4.0000 g | Freq: Once | INTRAVENOUS | Status: AC
  Administered 2023-02-10: 4 g via INTRAVENOUS
  Filled 2023-02-10: qty 100

## 2023-02-10 MED ORDER — HYDROMORPHONE HCL 8 MG PO TABS: 8.0000 mg | ORAL_TABLET | ORAL | 0 refills | Status: DC | PRN

## 2023-02-10 MED ORDER — ONDANSETRON HCL 4 MG PO TABS: 4.0000 mg | ORAL_TABLET | Freq: Four times a day (QID) | ORAL | Status: DC | PRN

## 2023-02-10 MED ORDER — PANTOPRAZOLE SODIUM 40 MG IV SOLR
40.0000 mg | INTRAVENOUS | Status: DC
  Administered 2023-02-10 – 2023-02-12 (×3): 40 mg via INTRAVENOUS
  Filled 2023-02-10 (×3): qty 10

## 2023-02-10 MED ORDER — CHLORHEXIDINE GLUCONATE CLOTH 2 % EX PADS
6.0000 | MEDICATED_PAD | Freq: Every day | CUTANEOUS | Status: DC
  Administered 2023-02-11 – 2023-02-12 (×2): 6 via TOPICAL

## 2023-02-10 MED ORDER — ONDANSETRON HCL 4 MG/2ML IJ SOLN
4.0000 mg | Freq: Four times a day (QID) | INTRAMUSCULAR | Status: DC | PRN
  Administered 2023-02-10 – 2023-02-12 (×9): 4 mg via INTRAVENOUS
  Filled 2023-02-10 (×9): qty 2

## 2023-02-10 MED ORDER — LACTATED RINGERS IV SOLN: INTRAVENOUS | Status: AC

## 2023-02-10 MED ORDER — ACETAMINOPHEN 650 MG RE SUPP
650.0000 mg | Freq: Four times a day (QID) | RECTAL | Status: DC | PRN
Start: 2023-02-10 — End: 2023-02-12

## 2023-02-10 MED ORDER — ENOXAPARIN SODIUM 40 MG/0.4ML IJ SOSY
40.0000 mg | PREFILLED_SYRINGE | INTRAMUSCULAR | Status: DC
Start: 2023-02-10 — End: 2023-02-12
  Filled 2023-02-10: qty 0.4

## 2023-02-10 MED ORDER — HYDROMORPHONE HCL 1 MG/ML IJ SOLN
0.5000 mg | INTRAMUSCULAR | Status: DC | PRN
  Administered 2023-02-10: 0.5 mg via INTRAVENOUS
  Filled 2023-02-10: qty 0.5

## 2023-02-10 MED ORDER — ORAL CARE MOUTH RINSE: 15.0000 mL | OROMUCOSAL | Status: DC | PRN

## 2023-02-10 MED ORDER — ACETAMINOPHEN 325 MG PO TABS: 650.0000 mg | ORAL_TABLET | Freq: Four times a day (QID) | ORAL | Status: DC | PRN

## 2023-02-10 MED ORDER — HYDROMORPHONE HCL 1 MG/ML IJ SOLN
0.5000 mg | Freq: Four times a day (QID) | INTRAMUSCULAR | Status: DC
  Administered 2023-02-10: 0.5 mg via INTRAVENOUS
  Filled 2023-02-10: qty 1

## 2023-02-10 MED ORDER — HYDROMORPHONE HCL 1 MG/ML IJ SOLN
1.0000 mg | INTRAMUSCULAR | Status: DC | PRN
  Administered 2023-02-10 – 2023-02-12 (×15): 1 mg via INTRAVENOUS
  Filled 2023-02-10 (×15): qty 1

## 2023-02-10 NOTE — Plan of Care (Signed)
  Problem: Acute Rehab OT Goals (only OT should resolve) Goal: Pt. Will Perform Grooming Flowsheets (Taken 02/10/2023 0940) Pt Will Perform Grooming:  with modified independence  standing Goal: Pt. Will Perform Upper Body Dressing Flowsheets (Taken 02/10/2023 0940) Pt Will Perform Upper Body Dressing: with modified independence Goal: Pt. Will Perform Lower Body Dressing Flowsheets (Taken 02/10/2023 0940) Pt Will Perform Lower Body Dressing: with modified independence Goal: Pt. Will Transfer To Toilet Flowsheets (Taken 02/10/2023 0940) Pt Will Transfer to Toilet:  with modified independence  ambulating Goal: Pt. Will Perform Toileting-Clothing Manipulation Flowsheets (Taken 02/10/2023 0940) Pt Will Perform Toileting - Clothing Manipulation and hygiene: with modified independence Goal: Pt/Caregiver Will Perform Home Exercise Program Flowsheets (Taken 02/10/2023 0940) Pt/caregiver will Perform Home Exercise Program:  Increased strength  Both right and left upper extremity  Independently  Andrea Ferrer OT, MOT

## 2023-02-10 NOTE — Evaluation (Signed)
Occupational Therapy Evaluation Patient Details Name: Michelle Holt MRN: [id number] DOB: 15-Oct-1970 Today's Date: 02/10/2023   History of Present Illness Michelle Holt is a 53 y.o. 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  Patient was recently admitted from 01/14/2023 to 01/15/2023 due to malignant ascites s/p paracentesis.  Patient has not had any chemotherapy since 01/07/2023 due to being very sick from having nausea, vomiting, poor appetite whereby she has not been eating, loss of control of bowels.  She felt so weak that she could barely make it to her bathroom from her bedroom, so she called her oncologist on Friday, 1/24 and was asked to go to the ED.  She presented to the ED due to worsening weakness. (per DO)   Clinical Impression   Pt agreeable to OT and PT co-evaluation. Pt assisted at baseline by nurse at home for bathing, dressing, and toileting. Pt demonstrated bed mobility with mod I level of assist and transfers/ambulation with CGA to min A. More CGA if using RW and more min A without RW due to pt stating she typically would seek physical support. Set up assist for lower body dressing today but it seems as though pt's level of assist fluctuates due to her fatigue. Pt was left in the chair with call bell within reach. Pt will benefit from continued OT in the hospital and recommended venue below to increase strength, balance, and endurance for safe ADL's.          If plan is discharge home, recommend the following: A little help with walking and/or transfers;A little help with bathing/dressing/bathroom;Assistance with cooking/housework;Assist for transportation;Help with stairs or ramp for entrance    Functional Status Assessment  Patient has had a recent decline in their functional status and  demonstrates the ability to make significant improvements in function in a reasonable and predictable amount of time.  Equipment Recommendations  None recommended by OT           Precautions / Restrictions Precautions Precautions: Fall Restrictions Weight Bearing Restrictions Per Provider Order: No      Mobility Bed Mobility Overal bed mobility: Modified Independent                  Transfers Overall transfer level: Needs assistance Equipment used: Rolling walker (2 wheels) Transfers: Sit to/from Stand, Bed to chair/wheelchair/BSC Sit to Stand: Contact guard assist     Step pivot transfers: Contact guard assist, Min assist     General transfer comment: CGA with RW; CGA to min A without RW      Balance Overall balance assessment: Needs assistance Sitting-balance support: No upper extremity supported, Feet supported Sitting balance-Leahy Scale: Good Sitting balance - Comments: seated in chair and at EOB   Standing balance support: No upper extremity supported, During functional activity Standing balance-Leahy Scale: Poor Standing balance comment: poor to fair without AD; fair to good with RW                           ADL either performed or assessed with clinical judgement   ADL Overall ADL's : Needs assistance/impaired     Grooming: Contact guard assist;Standing   Upper Body Bathing: Set up;Sitting;Minimal assistance   Lower Body Bathing: Set up;Sitting/lateral leans;Minimal assistance   Upper Body Dressing : Set up;Sitting;Minimal assistance  Lower Body Dressing: Set up;Sitting/lateral leans;Minimal assistance Lower Body Dressing Details (indicate cue type and reason): Able to doff and don sock seated at edge of chair. Toilet Transfer: Supervision/safety;Contact guard assist;Ambulation;Rolling walker (2 wheels) Toilet Transfer Details (indicate cue type and reason): Simulated via ambulation in the hall and transfer to chair. Toileting-  Clothing Manipulation and Hygiene: Set up;Minimal assistance;Sitting/lateral lean       Functional mobility during ADLs: Contact guard assist;Supervision/safety;Rolling walker (2 wheels) General ADL Comments: Ambulated over 100 feet in the hall with RW; seeking hand held support when ambulating without RW     Vision Baseline Vision/History: 1 Wears glasses Ability to See in Adequate Light: 1 Impaired Patient Visual Report: No change from baseline Vision Assessment?: Wears glasses for reading     Perception Perception: Not tested       Praxis Praxis: Not tested       Pertinent Vitals/Pain Pain Assessment Pain Assessment: Faces Faces Pain Scale: No hurt     Extremity/Trunk Assessment Upper Extremity Assessment Upper Extremity Assessment: Generalized weakness   Lower Extremity Assessment Lower Extremity Assessment: Defer to PT evaluation   Cervical / Trunk Assessment Cervical / Trunk Assessment: Normal   Communication Communication Communication: No apparent difficulties   Cognition Arousal: Alert Behavior During Therapy: WFL for tasks assessed/performed Overall Cognitive Status: Within Functional Limits for tasks assessed                                                        Home Living Family/patient expects to be discharged to:: Private residence Living Arrangements: Children Available Help at Discharge: Family;Other (Comment);Available 24 hours/day (Son's nurse also present) Type of Home: House Home Access: Ramped entrance     Home Layout: One level     Bathroom Shower/Tub: Producer, television/film/video: Handicapped height Bathroom Accessibility: Yes How Accessible: Accessible via wheelchair;Accessible via walker Home Equipment: BSC/3in1;Grab bars - toilet          Prior Functioning/Environment Prior Level of Function : Needs assist       Physical Assist : ADLs (physical)   ADLs (physical):  Bathing;Dressing;Toileting;IADLs Mobility Comments: [place] ambulation with RW; recently bed to [facility] mostly. ADLs Comments: Recently getting assist for bathing, dressing, and toileting. IADL assist as well.        OT Problem List: Decreased strength;Decreased activity tolerance;Impaired balance (sitting and/or standing)      OT Treatment/Interventions: Self-care/ADL training;Therapeutic exercise;Therapeutic activities;Patient/family education    OT Goals(Current goals can be found in the care plan section) Acute Rehab OT Goals Patient Stated Goal: return home OT Goal Formulation: With patient Time For Goal Achievement: 02/24/23 Potential to Achieve Goals: Good  OT Frequency: Min 2X/week    Co-evaluation PT/OT/SLP Co-Evaluation/Treatment: Yes Reason for Co-Treatment: To address functional/ADL transfers   OT goals addressed during session: ADL's and self-care                       End of Session Equipment Utilized During Treatment: Rolling walker (2 wheels)  Activity Tolerance: Patient tolerated treatment well Patient left: in chair;with call bell/phone within reach  OT Visit Diagnosis: Unsteadiness on feet (R26.81);Other abnormalities of gait and mobility (R26.89);Muscle weakness (generalized) (M62.81)                Time: 0981-1914 OT Time Calculation (  min): 15 min Charges:  OT General Charges $OT Visit: 1 Visit OT Evaluation $OT Eval Low Complexity: 1 Low  Miracle Criado OT, MOT   Danie Chandler 02/10/2023, 9:38 AM

## 2023-02-10 NOTE — Consult Note (Signed)
[facility] Consultation Oncology  Name: Michelle Holt      MRN: [id number]    Location: A202/A202-01  Date: 02/10/2023 Time:4:32 PM   REFERRING PHYSICIAN: Dr. Marisa Severin  REASON FOR CONSULT: Recurrent serous ovarian cancer   DIAGNOSIS: Partial small bowel obstruction  HISTORY OF PRESENT ILLNESS: Michelle Holt is a 53 year old female known to me from office visits.  She had recent recurrence of ovarian cancer and was started on carboplatin, gemcitabine and bevacizumab cycle 1 on 01/07/2023.  She came to the ER with abdominal pain on 02/09/2023.  CTAP showed thickened segment in the central abdomen which is believed to be the transitional segment causing intermediate grade obstruction, probably mid ileal on location.  She is being conservatively managed with n.p.o. and IV hydration.  She is also receiving Dilaudid for pain control.  She reports that she is feeling better as she had few bowel movements.  She is tolerating clear liquids very well.  Last chemo was on 01/17/2023.  PAST MEDICAL HISTORY:   Past Medical History:  Diagnosis Date   Anemia    Anxiety and depression    Arthritis of facet joints at multiple vertebral levels    L5-S1   Constipation    Dyslipidemia    Family history of breast cancer    Family history of uterine cancer    GERD (gastroesophageal reflux disease)    History of hiatal hernia    History of kidney stones    Insomnia    Irritable bowel syndrome    Migraine    Muscle tension headache    Neuropathy of finger    Ovarian carcinoma (HCC)    ovarian   Plantar fasciitis of right foot    Port-A-Cath in place 08/20/2018    ALLERGIES: Allergies  Allergen Reactions   Morphine And Codeine Itching   Nickel Itching   Nortriptyline Other (See Comments)    Significant weight gain   Topamax [Topiramate] Diarrhea and Nausea Only   Xanax [Alprazolam] Other (See Comments)    "Can't wake up"   Actifed Cold-Allergy [Chlorpheniramine-Phenyleph Er] Rash and Other  (See Comments)    Red dye only   Amoxicillin Rash   Codeine Hives   Erythromycin Rash   Gemzar [Gemcitabine] Other (See Comments)    Feeling flushed, red in the face   Penicillins Rash        Red Dye Rash   Sudafed [Pseudoephedrine Hcl] Rash and Other (See Comments)    Red dye only      MEDICATIONS: I have reviewed the patient's current medications.     PAST SURGICAL HISTORY Past Surgical History:  Procedure Laterality Date   ABDOMINAL HYSTERECTOMY     BIOPSY  10/27/2020   Procedure: BIOPSY;  Surgeon: Dolores Frame, MD;  [facility];  Service: Gastroenterology;;   CHOLECYSTECTOMY  2008   COLONOSCOPY N/A 08/13/2013   Procedure: COLONOSCOPY;  Surgeon: Malissa Hippo, MD;  [facility];  Service: Endoscopy;  Laterality: N/A;  230-moved to 145 Ann to notify pt   COLONOSCOPY WITH PROPOFOL N/A 11/15/2020   Procedure: COLONOSCOPY WITH PROPOFOL;  Surgeon: Malissa Hippo, MD;  [facility];  Service: Endoscopy;  Laterality: N/A;  1:40   ESOPHAGEAL DILATION N/A 02/28/2021   Procedure: ESOPHAGEAL DILATION;  Surgeon: Malissa Hippo, MD;  [facility];  Service: Endoscopy;  Laterality: N/A;   ESOPHAGOGASTRODUODENOSCOPY     ESOPHAGOGASTRODUODENOSCOPY (EGD) WITH PROPOFOL N/A 07/20/2018   Procedure: ESOPHAGOGASTRODUODENOSCOPY (EGD)  WITH PROPOFOL;  Surgeon: Malissa Hippo, MD;  [facility];  Service: Endoscopy;  Laterality: N/A;  Possible esophageal dilation.   ESOPHAGOGASTRODUODENOSCOPY (EGD) WITH PROPOFOL N/A 10/27/2020   Procedure: ESOPHAGOGASTRODUODENOSCOPY (EGD) WITH PROPOFOL;  Surgeon: Dolores Frame, MD;  [facility];  Service: Gastroenterology;  Laterality: N/A;  2:10, pt knows to arrive at 10:15   ESOPHAGOGASTRODUODENOSCOPY (EGD) WITH PROPOFOL N/A 02/28/2021   Procedure: ESOPHAGOGASTRODUODENOSCOPY (EGD) WITH PROPOFOL;  Surgeon: Malissa Hippo, MD;  [facility];  Service: Endoscopy;   Laterality: N/A;  200 ASA 1   ESOPHAGOGASTRODUODENOSCOPY (EGD) WITH PROPOFOL N/A 03/29/2022   Procedure: ESOPHAGOGASTRODUODENOSCOPY (EGD) WITH PROPOFOL;  Surgeon: Dolores Frame, MD;  [facility];  Service: Gastroenterology;  Laterality: N/A;  2:00 pm, asa 1-2   GIVENS CAPSULE STUDY N/A 11/24/2020   Procedure: GIVENS CAPSULE STUDY;  Surgeon: Malissa Hippo, MD;  [facility];  Service: Endoscopy;  Laterality: N/A;  7:30   IR ANGIOGRAM SELECTIVE EACH ADDITIONAL VESSEL  08/01/2018   IR ANGIOGRAM VISCERAL SELECTIVE  08/01/2018   IR EMBO ART  VEN HEMORR LYMPH EXTRAV  INC GUIDE ROADMAPPING  08/01/2018   IR IMAGING GUIDED PORT INSERTION  08/20/2018   IR PERC PLEURAL DRAIN W/INDWELL CATH W/IMG GUIDE  07/08/2018   IR THORACENTESIS ASP PLEURAL SPACE W/IMG GUIDE  07/07/2018   IR US GUIDE VASC ACCESS RIGHT  08/01/2018   PLEURAL EFFUSION DRAINAGE Left 07/13/2018   Procedure: DRAINAGE OF LOCULATED PLEURAL EFFUSION;  Surgeon: Kerin Perna, MD;  Location: [facility] OR;  Service: Thoracic;  Laterality: Left;   POLYPECTOMY  11/15/2020   Procedure: POLYPECTOMY;  Surgeon: Malissa Hippo, MD;  [facility];  Service: Endoscopy;;   REMOVAL OF PLEURAL DRAINAGE CATHETER Left 08/20/2018   Procedure: REMOVAL OF PLEURAL DRAINAGE CATHETER;  Surgeon: Kerin Perna, MD;  Location: [facility] OR;  Service: Thoracic;  Laterality: Left;   REMOVAL OF PLEURAL DRAINAGE CATHETER Left 08/20/2018   Procedure: REMOVAL OF PLEURAL DRAINAGE CATHETER;  Surgeon: Kerin Perna, MD;  Location: [facility] OR;  Service: Thoracic;  Laterality: Left;   TALC PLEURODESIS Left 07/13/2018   Procedure: Talc Pleuradesis;  Surgeon: Donata Clay, Theron Arista, MD;  Location: [facility] OR;  Service: Thoracic;  Laterality: Left;   TOTAL HIP ARTHROPLASTY Left 06/05/2020   Procedure: LEFT TOTAL HIP ARTHROPLASTY ANTERIOR APPROACH;  Surgeon: Tarry Kos, MD;  Location: [facility] OR;  Service: Orthopedics;  Laterality: Left;  3-C   TUBAL LIGATION Bilateral     UTERINE ABLATION     VIDEO ASSISTED THORACOSCOPY Left 07/13/2018   Procedure: VIDEO ASSISTED THORACOSCOPY;  Surgeon: Donata Clay, Theron Arista, MD;  Location: [facility] OR;  Service: Thoracic;  Laterality: Left;    FAMILY HISTORY: Family History  Problem Relation Age of Onset   Depression Mother    Hypertension Mother    Obesity Mother    Diabetes Mother    Kidney disease Mother    Peripheral vascular disease Father    Atrial fibrillation Father    Crohn's disease Sister    Uterine cancer Sister 26       maternal half sister   COPD Brother    Osteoporosis Brother    Breast cancer Maternal Aunt 50   Colon cancer Neg Hx     SOCIAL HISTORY:  reports that she has been smoking cigarettes. She started smoking about 21 years ago. She has a 8.6 pack-year smoking history. She has never used smokeless tobacco. She reports  that she does not currently use alcohol after a past usage of about 1.0 standard drink of alcohol per week. She reports that she does not use drugs.  PERFORMANCE STATUS: The patient's performance status is 1 - Symptomatic but completely ambulatory  PHYSICAL EXAM: Most Recent Vital Signs: Blood pressure (!) 87/48, pulse 87, temperature 97.9 F (36.6 C), temperature source Oral, resp. rate 18, height 5\' 4"  (1.626 m), weight 168 lb 3.4 oz (76.3 kg), SpO2 98%. BP (!) 87/48 (BP Location: Right Arm)   Pulse 87   Temp 97.9 F (36.6 C) (Oral)   Resp 18   Ht 5\' 4"  (1.626 m)   Wt 168 lb 3.4 oz (76.3 kg)   LMP  (LMP Unknown)   SpO2 98%   BMI 28.87 kg/m  General appearance: alert, cooperative, and appears stated age Lungs: clear to auscultation bilaterally Abdomen:  Soft, mild distention, nontender. Extremities:  No edema or cyanosis.  LABORATORY DATA:  Results for orders placed or performed during the hospital encounter of 02/09/23 (from the past 48 hours)  CBC with Differential     Status: Abnormal   Collection Time: 02/09/23  9:37 PM  Result Value Ref Range   WBC 9.7 4.0 - 10.5  K/uL   RBC 2.58 (L) 3.87 - 5.11 MIL/uL   Hemoglobin 9.3 (L) 12.0 - 15.0 g/dL   HCT 16.1 (L) 09.6 - 04.5 %   MCV 115.9 (H) 80.0 - 100.0 fL   MCH 36.0 (H) 26.0 - 34.0 pg   MCHC 31.1 30.0 - 36.0 g/dL   RDW 40.9 (H) 81.1 - 91.4 %   Platelets 214 150 - 400 K/uL   nRBC 0.0 0.0 - 0.2 %   Neutrophils Relative % 66 %   Neutro Abs 6.5 1.7 - 7.7 K/uL   Lymphocytes Relative 20 %   Lymphs Abs 1.9 0.7 - 4.0 K/uL   Monocytes Relative 12 %   Monocytes Absolute 1.1 (H) 0.1 - 1.0 K/uL   Eosinophils Relative 0 %   Eosinophils Absolute 0.0 0.0 - 0.5 K/uL   Basophils Relative 0 %   Basophils Absolute 0.0 0.0 - 0.1 K/uL   Immature Granulocytes 2 %   Abs Immature Granulocytes 0.17 (H) 0.00 - 0.07 K/uL    Comment: Performed at [facility], [address] [place], [place] [address]  Comprehensive metabolic panel     Status: Abnormal   Collection Time: 02/09/23  9:37 PM  Result Value Ref Range   Sodium 134 (L) 135 - 145 mmol/L   Potassium 4.0 3.5 - 5.1 mmol/L   Chloride 94 (L) 98 - 111 mmol/L   CO2 26 22 - 32 mmol/L   Glucose, Bld 89 70 - 99 mg/dL    Comment: Glucose reference range applies only to samples taken after fasting for at least 8 hours.   BUN 12 6 - 20 mg/dL   Creatinine, Ser 6.21 0.44 - 1.00 mg/dL   Calcium 7.4 (L) 8.9 - 10.3 mg/dL   Total Protein 5.1 (L) 6.5 - 8.1 g/dL   Albumin 1.9 (L) 3.5 - 5.0 g/dL   AST 65 (H) 15 - 41 U/L   ALT 49 (H) 0 - 44 U/L   Alkaline Phosphatase 536 (H) 38 - 126 U/L   Total Bilirubin 1.8 (H) 0.0 - 1.2 mg/dL   GFR, Estimated >30 >86 mL/min    Comment: (NOTE) Calculated using the CKD-EPI Creatinine Equation (2021)    Anion gap 14 5 - 15    Comment:  Performed at [facility], [address], [place], [place] [address]  Lipase, blood     Status: None   Collection Time: 02/09/23  9:37 PM  Result Value Ref Range   Lipase 16 11 - 51 U/L    Comment: Performed at [facility], [address] [place], [place] [address]  Urinalysis, Routine w reflex microscopic  -Urine, Clean Catch     Status: Abnormal   Collection Time: 02/09/23 11:33 PM  Result Value Ref Range   Color, Urine YELLOW YELLOW   APPearance CLEAR CLEAR   Specific Gravity, Urine >1.046 (H) 1.005 - 1.030   pH 6.0 5.0 - 8.0   Glucose, UA NEGATIVE NEGATIVE mg/dL   Hgb urine dipstick NEGATIVE NEGATIVE   Bilirubin Urine NEGATIVE NEGATIVE   Ketones, ur NEGATIVE NEGATIVE mg/dL   Protein, ur NEGATIVE NEGATIVE mg/dL   Nitrite NEGATIVE NEGATIVE   Leukocytes,Ua NEGATIVE NEGATIVE    Comment: Performed at [facility], [address] [place], [place] [address]  Comprehensive metabolic panel     Status: Abnormal   Collection Time: 02/10/23  4:16 AM  Result Value Ref Range   Sodium 134 (L) 135 - 145 mmol/L   Potassium 4.1 3.5 - 5.1 mmol/L   Chloride 96 (L) 98 - 111 mmol/L   CO2 28 22 - 32 mmol/L   Glucose, Bld 108 (H) 70 - 99 mg/dL    Comment: Glucose reference range applies only to samples taken after fasting for at least 8 hours.   BUN 10 6 - 20 mg/dL   Creatinine, Ser 2.13 0.44 - 1.00 mg/dL   Calcium 7.3 (L) 8.9 - 10.3 mg/dL   Total Protein 5.0 (L) 6.5 - 8.1 g/dL   Albumin 1.9 (L) 3.5 - 5.0 g/dL   AST 63 (H) 15 - 41 U/L   ALT 47 (H) 0 - 44 U/L   Alkaline Phosphatase 526 (H) 38 - 126 U/L   Total Bilirubin 1.5 (H) 0.0 - 1.2 mg/dL   GFR, Estimated >08 >65 mL/min    Comment: (NOTE) Calculated using the CKD-EPI Creatinine Equation (2021)    Anion gap 10 5 - 15    Comment: Performed at [facility], [address] [place], [place] [address]  CBC     Status: Abnormal   Collection Time: 02/10/23  4:16 AM  Result Value Ref Range   WBC 9.5 4.0 - 10.5 K/uL   RBC 2.33 (L) 3.87 - 5.11 MIL/uL   Hemoglobin 8.4 (L) 12.0 - 15.0 g/dL   HCT 62.9 (L) 52.8 - 41.3 %   MCV 116.3 (H) 80.0 - 100.0 fL   MCH 36.1 (H) 26.0 - 34.0 pg   MCHC 31.0 30.0 - 36.0 g/dL   RDW Not Measured 24.4 - 15.5 %   Platelets 202 150 - 400 K/uL   nRBC 0.2 0.0 - 0.2 %    Comment: Performed at [facility], [address], [place], [place] [address]  Magnesium     Status: Abnormal   Collection Time: 02/10/23  4:16 AM  Result Value Ref Range   Magnesium 1.1 (L) 1.7 - 2.4 mg/dL    Comment: Performed at [facility], [address] [place], [place] [address]  Phosphorus     Status: None   Collection Time: 02/10/23  4:16 AM  Result Value Ref Range   Phosphorus 2.9 2.5 - 4.6 mg/dL    Comment: Performed at [facility], [address], [place], [place] [address]   *Note: Due to  a large number of results and/or encounters for the requested time period, some results have not been displayed. A complete set of results can be found in Results Review.      RADIOGRAPHY: CT ABDOMEN PELVIS W CONTRAST Result Date: 02/09/2023 CLINICAL DATA:  Abdominal pain, nausea, and vomiting x2 months. Patient has ovarian cancer with metastases. Ultrasound therapeutic paracentesis x3 this month, last on 01/22/2023 with 600 mL clear yellow fluid removed. EXAM: CT ABDOMEN AND PELVIS WITH CONTRAST TECHNIQUE: Multidetector CT imaging of the abdomen and pelvis was performed using the standard protocol following bolus administration of intravenous contrast. RADIATION DOSE REDUCTION: This exam was performed according to the departmental dose-optimization program which includes automated exposure control, adjustment of the mA and/or kV according to patient size and/or use of iterative reconstruction technique. CONTRAST:  OMNIPAQUE IOHEXOL 300 MG/ML  SOLN COMPARISON:  Numerous prior CTs. The 2 most recent are a CT abdomen pelvis with contrast 12/24/2022 and CTA chest, abdomen and pelvis from 12/15/2022. FINDINGS: Lower chest: An infusion catheter again noted terminating about the superior cavoatrial junction. The cardiac size is normal. There is a small hiatal hernia. Mild elevation right hemidiaphragm with lung bases clear. Hepatobiliary: Moderate to severe hepatic steatosis is increased in the interval. There is no mass enhancement. Gallbladder is  absent as before with stable prominent common bile duct measuring 11 mm. Pancreas: Partially atrophic. No focal abnormality. No inflammation. Spleen: Stable 7 cm splenic cystic lesion with patchy calcifications. Lobulated appearance of the spleen. No splenomegaly. Embolization coils again noted at the splenic hilum. No new abnormality. Adrenals/Urinary Tract: Adrenal glands are unremarkable. Kidneys are normal, without renal calculi, focal lesion, or hydronephrosis. Bladder is unremarkable. Stomach/Bowel: Chronic thickened folds in the upper to mid stomach. Mild increase parent fluid distention. Increased dilatation in duodenum and proximal to mid small bowel up to 4.4 cm caliber. There are thickened folds in multiple dilated and nondilated segments. There is a thickened segment in the central abdomen best seen on 2: 34-37 which is believed to be the transitional segment causing intermediate grade obstruction. Decompressed segments are present downstream. This is probably mid ileal in location. An appendix is not seen. There is mild fecal stasis ascending and transverse colon. Rest of the large bowel is largely contracted. Vascular/Lymphatic: Aortic atherosclerosis. No enlarged abdominal or pelvic lymph nodes. Reproductive: Status post hysterectomy. No adnexal masses. Multiple pelvic phleboliths. Other: Mild-to-moderate free fluid, less than on 12/24/2022. There is enhancement of the peritoneal reflections consistent with peritoneal carcinomatosis, ranging from linear to nodular enhancement as before. No overt progression in this is seen. There are minimal layering presacral ascites as well. There is no free hemorrhage or free air.  No incarcerated hernia. Musculoskeletal: Left hip replacement. Grade 1 degenerative anterolisthesis L4-5 with advanced facet hypertrophy and acquired spinal stenosis. Osteopenia.  No regional bone metastasis is seen. IMPRESSION: 1. Intermediate grade small-bowel obstruction with  transitional segment in the central abdomen, probably mid ileal in location, due to a thickened segment. No pneumatosis. 2. Mild-to-moderate ascites, less than on 12/24/2022. 3. Peritoneal carcinomatosis findings without overt progression. 4. Increased moderate to severe hepatic steatosis. 5. Stable 7 cm splenic cystic lesion with patchy calcifications. 6. Aortic atherosclerosis. 7. Small hiatal hernia. 8. Osteopenia and degenerative change. Aortic Atherosclerosis (ICD10-I70.0). Electronically Signed   By: Almira Bar M.D.   On: 02/09/2023 23:03   DG Chest Portable 1 View Result Date: 02/09/2023 CLINICAL DATA:  Shortness of breath with abdominal pain. EXAM: PORTABLE CHEST 1 VIEW COMPARISON:  Chest x-ray 11/12/2021 FINDINGS: Right chest port catheter tip projects over the distal SVC. The heart size and mediastinal contours are within normal limits. Both lungs are clear. The visualized skeletal structures are unremarkable. IMPRESSION: No active disease. Electronically Signed   By: Darliss Cheney M.D.   On: 02/09/2023 21:58        ASSESSMENT and PLAN:  1.  Recurrent high-grade serous ovarian cancer: - Cycle 1 of carboplatin, gemcitabine and bevacizumab on 01/07/2023, day 8 treatment on 01/17/2023 - She had abdominal pain, nausea and vomiting at home. - CTAP (02/09/2023): Intermediate grade small bowel obstruction probably medial in location.  Mild to moderate ascites improved from prior scan.  Peritoneal carcinomatosis without progression.  Increased moderate to severe hepatic steatosis. - We discussed best supportive care in the form of hospice.  As she is feeling better, she would like to continue treatments. - We will plan to start cycle 2 early next week if she continues to do well. - She already talked with palliative care and referral was made to outpatient palliative care with [facility].  2.  Abdominal pain: - She has stopped taking OxyContin about 3 weeks ago due to severe nausea and vomiting. -  Continue Dilaudid 8 mg 1 to half tablet as needed.  She is requiring 3-4 times a day.  3.  Nausea/vomiting: - She did not have any nausea or vomiting during the hospitalization.  Continue to use Zofran p.o./sublingual every 6-8 hours as needed. - Will also consider using olanzapine 2.5-5 mg at bedtime daily.  All questions were answered. The patient knows to call the clinic with any problems, questions or concerns. We can certainly see the patient much sooner if necessary.   Doreatha Massed

## 2023-02-10 NOTE — Consult Note (Signed)
Consultation Note Date: 02/10/2023   Patient Name: Michelle Holt  DOB: Mar 21, 1970  MRN: [id number]  Age / Sex: 53 y.o., female  PCP: Ignatius Specking, MD Referring Physician: Shon Hale, MD  Reason for Consultation: Establishing goals of care  HPI/Patient Profile: 53 y.o. female  with past medical history of stage IV serous ovarian cancer with peritoneal and pleural implants, GERD, cancer associated pain, dyslipidemia, history of hiatal hernia, irritable bowel, anxiety/depression, admitted on 02/09/2023 with small bowel obstruction.   Clinical Assessment and Goals of Care: I have reviewed medical records including EPIC notes, labs and imaging, received report from RN, assessed the patient.  Michelle Holt, Michelle Holt, is lying quietly in bed.  She appears acutely ill and somewhat frail.  She greets me, making and mostly keeping eye contact.  She is alert and oriented x 3, able to make her needs known.  There is no family at bedside at this time.  We meet at the bedside to discuss diagnosis prognosis, GOC, EOL wishes, disposition and options.  I introduced Palliative Medicine as specialized medical care for people living with serious illness. It focuses on providing relief from the symptoms and stress of a serious illness. The goal is to improve quality of life for both the patient and the family.  We discussed a brief life review of the patient.  Michelle Holt shares that she is a widow.  Her husband battled cancer for 3 years before dying.  He had the services of [facility] at home hospice care.  Michelle Holt is a caregiver for her son aged 21 who has traumatic brain injury and lives in her home.  Michelle Holt has been living independently prior to the illness.  She has a daughter, Manders but her sister, Babette Relic, is her healthcare surrogate.  We then focused on their current illness.  Overall, Michelle Holt is very knowledgeable about her acute and chronic  health concerns.  We talked about her small bowel obstruction, possibly resolving.  We talked about the plan for clear liquids today.  Michelle Holt shares that she had complained of nausea and vomiting, being unable to eat, for a months.  She tells me that initially, she was not going to seek cancer treatment as she had 2 prior rounds, August 2020 and February 2023.  She shares that when Dr. Kirtland Bouchard told her she might have 4 months without chemo she decided to seek treatment.  She talks about the importance of quality of life.  The natural disease trajectory and expectations at EOL were discussed.  Advanced directives, concepts specific to code status, artifical feeding and hydration, and rehospitalization were considered and discussed.  Michelle Holt readily endorses DNR.  She states that her healthcare surrogate is aware of her choice and can stand by her wish.  Hospice and Palliative Care services outpatient were explained and offered.  We talked about the benefits of hospice and palliative care.  Michelle Holt states that she would like to be connected with [place] palliative/hospice services.  Provider choice offered.  TOC updated.  Discussed the  importance of continued conversation with family and the medical providers regarding overall plan of care and treatment options, ensuring decisions are within the context of the patient's values and GOCs. Questions and concerns were addressed.  The family was encouraged to call with questions or concerns.  PMT will continue to support holistically.  Conference with attending, bedside nursing staff, transition of care team related to patient condition, needs, goals of care, disposition.   HCPOA HCPOA -sister, Michelle Holt.  States that she has HCPOA paperwork.    SUMMARY OF RECOMMENDATIONS   At this point continue to treat the treatable but no CPR or intubation Time for outcomes. Follow-up with trusted oncologist, Dr. Ellin Saba for next steps. Outpatient palliative services  with [facility]    Code Status/Advance Care Planning: DNR  Symptom Management:  Her hospitalist/outpatient palliative services through [facility]  Palliative Prophylaxis:  Frequent Pain Assessment and Oral Care  Additional Recommendations (Limitations, Scope, Preferences): Continue to treat but no CPR or intubation  Psycho-social/Spiritual:  Desire for further Chaplaincy support:no Additional Recommendations: Caregiving  Support/Resources and Education on Hospice  Prognosis:  Unable to determine, based on outcomes.  Guarded at this point with cancer burden in the abdomen, anticipate further SBO.  6 months or less would be expected.  Discharge Planning: Anticipate home with home health if qualified/desired, outpatient palliative with [facility].      Primary Diagnoses: Present on Admission:  SBO (small bowel obstruction) (HCC)  Abdominal pain  Nausea with vomiting  GERD (gastroesophageal reflux disease)  Malignant neoplasm of ovary (HCC)   I have reviewed the medical record, interviewed the patient and family, and examined the patient. The following aspects are pertinent.  Past Medical History:  Diagnosis Date   Anemia    Anxiety and depression    Arthritis of facet joints at multiple vertebral levels    L5-S1   Constipation    Dyslipidemia    Family history of breast cancer    Family history of uterine cancer    GERD (gastroesophageal reflux disease)    History of hiatal hernia    History of kidney stones    Insomnia    Irritable bowel syndrome    Migraine    Muscle tension headache    Neuropathy of finger    Ovarian carcinoma (HCC)    ovarian   Plantar fasciitis of right foot    Port-A-Cath in place 08/20/2018   Social History   Socioeconomic History   Marital status: Widowed    Spouse name: Not on file   Number of children: 2   Years of education: 2-College   Highest education level: Not on file  Occupational History    Employer: [employer]  Tobacco Use    Smoking status: Every Day    Current packs/day: 0.50    Average packs/day: 0.5 packs/day for 17.2 years (8.6 ttl pk-yrs)    Types: Cigarettes    Start date: 06/21/2001    Last attempt to quit: 06/22/2018   Smokeless tobacco: Never  Vaping Use   Vaping status: Never Used  Substance and Sexual Activity   Alcohol use: Not Currently    Alcohol/week: 1.0 standard drink of alcohol    Types: 1 Glasses of wine per week    Comment: occasionally   Drug use: No   Sexual activity: Not Currently  Other Topics Concern   Not on file  Social History Narrative   Patient lives at home with her daughter.    Patient has 2 children.  Patient is widowed.    Patient is right handed.    Patient has her Associates degree.      Social Drivers of Corporate investment banker Strain: Low Risk  (08/26/2022)   Received from [facility]   Overall Financial Resource Strain (CARDIA)    Difficulty of Paying Living Expenses: Not hard at all  Food Insecurity: No Food Insecurity (02/10/2023)   Hunger Vital Sign    Worried About Running Out of Food in the Last Year: Never true    Ran Out of Food in the Last Year: Never true  Transportation Needs: No Transportation Needs (02/10/2023)   PRAPARE - Administrator, Civil Service (Medical): No    Lack of Transportation (Non-Medical): No  Physical Activity: Inactive (07/22/2018)   Exercise Vital Sign    Days of Exercise per Week: 0 days    Minutes of Exercise per Session: 0 min  Stress: Stress Concern Present (07/22/2018)   [employer] of Occupational Health - Occupational Stress Questionnaire    Feeling of Stress : Very much  Social Connections: Moderately Isolated (07/22/2018)   Social Connection and Isolation Panel [NHANES]    Frequency of Communication with Friends and Family: More than three times a week    Frequency of Social Gatherings with Friends and Family: More than three times a week    Attends Religious Services: 1 to 4  times per year    Active Member of [employer] or Organizations: No    Attends Banker Meetings: Never    Marital Status: Widowed   Family History  Problem Relation Age of Onset   Depression Mother    Hypertension Mother    Obesity Mother    Diabetes Mother    Kidney disease Mother    Peripheral vascular disease Father    Atrial fibrillation Father    Crohn's disease Sister    Uterine cancer Sister 49       maternal half sister   COPD Brother    Osteoporosis Brother    Breast cancer Maternal Aunt 50   Colon cancer Neg Hx    Scheduled Meds:  Chlorhexidine Gluconate Cloth  6 each Topical Daily   enoxaparin (LOVENOX) injection  40 mg Subcutaneous Q24H   pantoprazole (PROTONIX) IV  40 mg Intravenous Q24H   Continuous Infusions: PRN Meds:.acetaminophen **OR** acetaminophen, HYDROmorphone (DILAUDID) injection, ondansetron **OR** ondansetron (ZOFRAN) IV, mouth rinse Medications Prior to Admission:  Prior to Admission medications   Medication Sig Start Date End Date Taking? Authorizing Provider  albuterol (VENTOLIN HFA) 108 (90 Base) MCG/ACT inhaler Inhale 2 puffs into the lungs every 6 (six) hours as needed for wheezing or shortness of breath.   Yes [provider]  dexamethasone (DECADRON) 4 MG tablet Take 1 tablet (4 mg total) by mouth daily. 01/17/23  Yes Doreatha Massed, MD  diazepam (VALIUM) 5 MG tablet Take 1 tablet (5 mg total) by mouth at bedtime. 09/05/22  Yes Doreatha Massed, MD  dicyclomine (BENTYL) 20 MG tablet Take 1 tablet (20 mg total) by mouth every 6 (six) hours as needed for spasms. 01/07/23  Yes Doreatha Massed, MD  HYDROmorphone (DILAUDID) 8 MG tablet Take 1 tablet (8 mg total) by mouth every 4 (four) hours as needed ([facility]). 01/13/23  Yes Doreatha Massed, MD  LORazepam (ATIVAN) 0.5 MG tablet Take 0.5 mg by mouth 3 (three) times daily as needed. 02/04/23  Yes [provider]  magic mouthwash w/lidocaine SOLN Take  5  mLs by mouth 4 (four) times daily as needed for mouth pain. Suspension contains equal amounts of Maalox Extra Strength, nystatin, diphenhydramine and lidocaine. 01/16/23  Yes Doreatha Massed, MD  omeprazole (PRILOSEC) 40 MG capsule Take 1 capsule (40 mg total) by mouth daily. 01/17/23  Yes Doreatha Massed, MD  ondansetron (ZOFRAN-ODT) 4 MG disintegrating tablet Take 1 tablet (4 mg total) by mouth every 8 (eight) hours as needed for nausea or vomiting. 01/22/23  Yes Doreatha Massed, MD  gabapentin (NEURONTIN) 100 MG capsule TAKE THREE CAPSULES BY MOUTH DAILY AT BEDTIME Patient not taking: Reported on 02/10/2023 01/13/23   Doreatha Massed, MD  naloxegol oxalate (MOVANTIK) 25 MG TABS tablet Take 1 tablet (25 mg total) by mouth daily. Patient not taking: Reported on 02/10/2023 12/30/22   Edsel Petrin, DO  oxyCODONE (OXYCONTIN) 60 MG 12 hr tablet Take 1 tablet by mouth every 12 (twelve) hours as needed. Patient not taking: Reported on 02/10/2023 01/20/23   Doreatha Massed, MD  oxyCODONE ER 36 MG C12A Take 1 capsule (36 mg total) by mouth 4 (four) times daily. Patient not taking: Reported on 02/10/2023 12/30/22   Meredeth Ide, MD   Allergies  Allergen Reactions   Morphine And Codeine Itching   Nickel Itching   Nortriptyline Other (See Comments)    Significant weight gain   Topamax [Topiramate] Diarrhea and Nausea Only   Xanax [Alprazolam] Other (See Comments)    "Can't wake up"   Actifed Cold-Allergy [Chlorpheniramine-Phenyleph Er] Rash and Other (See Comments)    Red dye only   Amoxicillin Rash   Codeine Hives   Erythromycin Rash   Gemzar [Gemcitabine] Other (See Comments)    Feeling flushed, red in the face   Penicillins Rash        Red Dye Rash   Sudafed [Pseudoephedrine Hcl] Rash and Other (See Comments)    Red dye only   Review of Systems  Unable to perform ROS: Other    Physical Exam Vitals and nursing note reviewed.  Constitutional:      General: She  is not in acute distress.    Appearance: She is ill-appearing.  Cardiovascular:     Rate and Rhythm: Normal rate.  Pulmonary:     Effort: Pulmonary effort is normal. No respiratory distress.  Skin:    General: Skin is warm and dry.  Neurological:     Mental Status: She is alert and oriented to person, place, and time.  Psychiatric:        Mood and Affect: Mood normal.        Behavior: Behavior normal.     Vital Signs: BP (!) 107/52 (BP Location: Right Arm)   Pulse (!) 102   Temp 98.2 F (36.8 C) (Oral)   Resp 20   Ht 5\' 4"  (1.626 m)   Wt 76.3 kg   LMP  (LMP Unknown)   SpO2 100%   BMI 28.87 kg/m  Pain Scale: 0-10   Pain Score: 10-Worst pain ever   SpO2: SpO2: 100 % O2 Device:SpO2: 100 % O2 Flow Rate: .O2 Flow Rate (L/min): 2 L/min  IO: Intake/output summary:  Intake/Output Summary (Last 24 hours) at 02/10/2023 1235 Last data filed at 02/10/2023 4696 Gross per 24 hour  Intake 1100 ml  Output --  Net 1100 ml    LBM: Last BM Date : 02/09/23 Baseline Weight: Weight: 83 kg Most recent weight: Weight: 76.3 kg     Palliative Assessment/Data:     Time In: 1300 Time  Out: 1415 Time Total: 75 minutes Greater than 50%  of this time was spent counseling and coordinating care related to the above assessment and plan.  Signed by: Katheran Awe, NP   Please contact Palliative Medicine Team phone at [phone] for questions and concerns.  For individual provider: See Loretha Stapler

## 2023-02-10 NOTE — TOC Initial Note (Signed)
Transition of Care [facility]) - Initial/Assessment Note    Patient Details  Name: Michelle Holt MRN: [id number] Date of Birth: July 05, 1970  [facility] CM/SW Contact:    Beather Arbour Phone Number: 02/10/2023, 11:46 AM  Clinical Narrative:                 Patient is risk for readmission. Patient was admitted for small bowel obstruction. CSW spoke with pt at bedside. Palliative was also present in room. Pt agreeable to having palliative services in the home through [place]. CSW asked MD to order. CSW assessed pt afterwards and shared PT recommendation for HHPT and RW. Pt is agree with Delila Spence was notified and able to accept referral. Pt shared that she had OP PT through [facility] but HHPT was fine. PT also recommended a RW. Pt is agreeable to [facility] providing RW . Pt asked for a 3-N-1. CSW asked MD order the items above. Patient confirmed that she has support in the home; States that her daughter, and son nurse helps out with a lot. Pt shared that she has a tone of equipment / exercise bikes in the home that is from her sons TBI injury and that the entire house is handicap accessible. Pt daughter will provide transportation once pt is medically ready. TOC continue to follow.   Expected Discharge Plan: Home w Home Health Services Barriers to Discharge: Continued Medical Work up   Patient Goals and CMS Choice Patient states their goals for this hospitalization and ongoing recovery are:: return back home CMS Medicare.gov Compare Post Acute Care list provided to:: Patient Choice offered to / list presented to : Patient      Expected Discharge Plan and Services In-house Referral: Clinical Social Work, Hospice / Palliative Care Discharge Planning Services: CM Consult Post Acute Care Choice: Durable Medical Equipment, Home Health Living arrangements for the past 2 months: Single Family Home                 DME Arranged: Walker rolling, 3-N-1 DME Agency: [facility] Date DME  Agency Contacted: 02/10/23 Time DME Agency Contacted: 1110 Representative spoke with at DME Agency: Ian Malkin HH Arranged: PT HH Agency: [facility] Date [facility] Agency Contacted: 02/10/23 Time HH Agency Contacted: 1118 Representative spoke with at [facility] Agency: Kandee Keen  Prior Living Arrangements/Services Living arrangements for the past 2 months: Single Family Home Lives with:: Adult Children, Other (Comment) (Son Nurse) Patient language and need for interpreter reviewed:: Yes Do you feel safe going back to the place where you live?: Yes      Need for Family Participation in Patient Care: Yes (Comment) Care giver support system in place?: Yes (comment) Current home services: DME, Home PT, Other (comment) (Palliative Care) Criminal Activity/Legal Involvement Pertinent to Current Situation/Hospitalization: No - Comment as needed  Activities of Daily Living   ADL Screening (condition at time of admission) Independently performs ADLs?: No Does the patient have a NEW difficulty with bathing/dressing/toileting/self-feeding that is expected to last >3 days?: Yes (Initiates electronic notice to provider for possible OT consult) Does the patient have a NEW difficulty with getting in/out of bed, walking, or climbing stairs that is expected to last >3 days?: Yes (Initiates electronic notice to provider for possible PT consult) Does the patient have a NEW difficulty with communication that is expected to last >3 days?: No Is the patient deaf or have difficulty hearing?: No Does the patient have difficulty seeing, even when wearing glasses/contacts?: No Does the  patient have difficulty concentrating, remembering, or making decisions?: No  Permission Sought/Granted      Share Information with NAME: Delailah     Permission granted to share info w Relationship: Patient     Emotional Assessment Appearance:: Appears stated age   Affect (typically observed): Appropriate, Accepting Orientation: :  Oriented to Self, Oriented to Place, Oriented to  Time, Oriented to Situation Alcohol / Substance Use: Tobacco Use Psych Involvement: No (comment)  Admission diagnosis:  SBO (small bowel obstruction) (HCC) [K56.609] Generalized abdominal pain [R10.84] Malignant ascites [R18.0] Intractable pain [R52] Malignant neoplasm of ovary, unspecified laterality (HCC) [C56.9] Nausea and vomiting, unspecified vomiting type [R11.2] Patient Active Problem List   Diagnosis Date Noted   Hypoalbuminemia due to protein-calorie malnutrition (HCC) 02/10/2023   Failure to thrive in adult 02/10/2023   SBO (small bowel obstruction) (HCC) 02/09/2023   Ascites, malignant 01/14/2023   Cancer associated pain 12/24/2022   Peritoneal carcinomatosis (HCC) 12/15/2022   Abdominal pain 12/15/2022   Malignant neoplasm of ovary (HCC) 12/15/2022   Nausea with vomiting 02/11/2022   Mixed stress and urge urinary incontinence 08/02/2021   Microscopic hematuria 08/02/2021   Palliative care by specialist    Goals of care, counseling/discussion    General weakness    Leukopenia 06/23/2021   Thrombocytopenia (HCC) 06/23/2021   Elevated d-dimer 06/23/2021   Low back pain 06/23/2021   Hiatal hernia 06/23/2021   Obesity (BMI 30-39.9) 06/23/2021   Acute bronchospasm 06/23/2021   CAP (community acquired pneumonia) 06/22/2021   GERD (gastroesophageal reflux disease)    Iron deficiency anemia 11/07/2020   Post-op pain 07/12/2020   Splenic lesion 07/12/2020   Status post total replacement of left hip 06/05/2020   Primary osteoarthritis of left hip 02/09/2020   Pain in right knee 02/09/2020   Palliative care patient 09/18/2018   Port-A-Cath in place 08/20/2018   Genetic testing 08/20/2018   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   Splenic laceration 07/31/2018   Family history of uterine cancer    Family history of breast cancer    Macrocytic anemia 07/18/2018   Ovarian cancer, bilateral (HCC)  07/18/2018   HCAP (healthcare-associated pneumonia) 07/18/2018   Pleural effusion 07/07/2018   Migraine 07/06/2018   Pleural effusion on left 07/06/2018   Acute respiratory failure with hypoxia (HCC) 07/06/2018   Tobacco abuse 07/06/2018   Generalized anxiety disorder 05/02/2014   Constipation 08/10/2013   Lumbosacral spondylosis without myelopathy 10/23/2011   Intractable migraine without aura 10/23/2011   PCP:  Ignatius Specking, MD Pharmacy:   [facility] - [place], [place] - [address] [place] [address] Phone: [phone] Fax: [phone]     Social Drivers of Health (SDOH) Social History: SDOH Screenings   Food Insecurity: No Food Insecurity (02/10/2023)  Housing: Low Risk  (02/10/2023)  Transportation Needs: No Transportation Needs (02/10/2023)  Utilities: Not At Risk (02/10/2023)  Depression (PHQ2-9): High Risk (03/11/2022)  Financial Resource Strain: Low Risk  (08/26/2022)   Received from [facility]  Physical Activity: Inactive (07/22/2018)  Social Connections: Moderately Isolated (07/22/2018)  Stress: Stress Concern Present (07/22/2018)  Tobacco Use: High Risk (02/09/2023)   SDOH Interventions:     Readmission Risk Interventions    02/10/2023   11:05 AM  Readmission Risk Prevention Plan  Transportation Screening Complete  HRI or Home Care Consult Complete  Social Work Consult for Recovery Care Planning/Counseling Complete  Palliative Care Screening Complete  Medication Review Oceanographer)  Complete

## 2023-02-10 NOTE — Plan of Care (Signed)
  Problem: Education: Goal: Knowledge of General Education information will improve Description: Including pain rating scale, medication(s)/side effects and non-pharmacologic comfort measures Outcome: Progressing   Problem: Clinical Measurements: Goal: Respiratory complications will improve Outcome: Progressing Goal: Cardiovascular complication will be avoided Outcome: Progressing   Problem: Activity: Goal: Risk for activity intolerance will decrease Outcome: Progressing   Problem: Coping: Goal: Level of anxiety will decrease Outcome: Progressing   Problem: Pain Managment: Goal: General experience of comfort will improve and/or be controlled Outcome: Progressing   Problem: Safety: Goal: Ability to remain free from injury will improve Outcome: Progressing   Problem: Skin Integrity: Goal: Risk for impaired skin integrity will decrease Outcome: Progressing   Problem: Nutrition: Goal: Adequate nutrition will be maintained Outcome: Not Progressing

## 2023-02-10 NOTE — Progress Notes (Signed)
PROGRESS NOTE  Michelle Holt, is a 53 y.o. female, DOB - Dec 10, 1970, ZOX:[id number]  Admit date - 02/09/2023   Admitting Physician Frankey Shown, DO  Outpatient Primary MD for the patient is Vyas, Angelina Pih, MD  LOS - 1  Chief Complaint  Patient presents with   Abdominal Pain       Brief Narrative:  53 y.o. female with medical history significant of stage 4 serous ovarian cancer with peritoneal and pleural implants, GERD admitted with persistent emesis in setting of small bowel obstruction   -Assessment and Plan: 1) intractable emesis--- due to SBO in the setting of peritoneal carcinomatosis in a patient with stage IV ovarian cancer -- GI symptoms improving--had BM -Patient requesting to try clear liquid diet --Per general surgeon no indication for surgical intervention at this time -Continue as needed antiemetics and pain medications -Advance diet as tolerated -Continue IV fluids until oral intake is reliable -Oncology consult appreciated  2)Recurrent high-grade serous ovarian cancer stage IV with peritoneal carcinomatosis-- -patient sees Dr. Ellin Saba -started on carboplatin, gemcitabine and bevacizumab cycle 1 on 01/07/2023.  -Last chemo was on 01/17/2023.   3)GERD--continue Protonix  4)Social/Ethics--palliative care consult appreciated -Patient is a DNR/DNI  5) chronic anemia--- due to underlying malignancy and chemotherapy  -hemoglobin currently greater than 8 =-No bleeding concerns -Monitor closely and transfuse as clinically indicated  6) hyponatremia--- due to dehydration, IV fluids as ordered  7) hypomagnesemia--replace and recheck  Status is: Inpatient   Disposition: The patient is from: Home              Anticipated d/c is to: Home              Anticipated d/c date is: 2 days              Patient currently is not medically stable to d/c. Barriers: Not Clinically Stable-   Code Status :  -  Code Status: Limited: Do not attempt resuscitation (DNR)  -DNR-LIMITED -Do Not Intubate/DNI    Family Communication:    NA (patient is alert, awake and coherent)   DVT Prophylaxis  :   - SCDs  enoxaparin (LOVENOX) injection 40 mg Start: 02/10/23 1000 SCDs Start: 02/10/23 0321   Lab Results  Component Value Date   PLT 202 02/10/2023    Inpatient Medications  Scheduled Meds:  Chlorhexidine Gluconate Cloth  6 each Topical Daily   enoxaparin (LOVENOX) injection  40 mg Subcutaneous Q24H   pantoprazole (PROTONIX) IV  40 mg Intravenous Q24H   Continuous Infusions: PRN Meds:.acetaminophen **OR** acetaminophen, HYDROmorphone (DILAUDID) injection, ondansetron **OR** ondansetron (ZOFRAN) IV, mouth rinse   Anti-infectives (From admission, onward)    None         Subjective: Michelle Holt today has no fevers,   No chest pain,    Nausea improving -No further emesis Had BM - requesting to try liquid diet  Objective: Vitals:   02/09/23 2338 02/10/23 0015 02/10/23 0402 02/10/23 1330  BP: 100/62 (!) 106/59 (!) 107/52 (!) 87/48  Pulse: (!) 102 (!) 101 (!) 102 87  Resp: 18 20 20 18   Temp:  98.3 F (36.8 C) 98.2 F (36.8 C) 97.9 F (36.6 C)  TempSrc:  Oral Oral Oral  SpO2: 97% 99% 100% 98%  Weight:  76.3 kg    Height:  5\' 4"  (1.626 m)      Intake/Output Summary (Last 24 hours) at 02/10/2023 1833 Last data filed at 02/10/2023 8119 Gross per 24 hour  Intake 1100 ml  Output --  Net 1100 ml   Filed Weights   02/09/23 2046 02/10/23 0015  Weight: 83 kg 76.3 kg    Physical Exam  Gen:- Awake Alert,  in no apparent distress  HEENT:- [place].AT, No sclera icterus Neck-Supple Neck,No JVD,.  Lungs-  CTAB , fair symmetrical air movement CV- S1, S2 normal, regular , right subclavian Port-A-Cath in situ Abd-  +ve B.Sounds, Abd Soft, +ve abdominal distention, mild abdominal discomfort on palpation Extremity/Skin:- No  edema, pedal pulses present  Psych-affect is appropriate, oriented x3 Neuro-no new focal deficits, no tremors  Data  Reviewed: I have personally reviewed following labs and imaging studies  CBC: Recent Labs  Lab 02/09/23 2137 02/10/23 0416  WBC 9.7 9.5  NEUTROABS 6.5  --   HGB 9.3* 8.4*  HCT 29.9* 27.1*  MCV 115.9* 116.3*  PLT 214 202   Basic Metabolic Panel: Recent Labs  Lab 02/09/23 2137 02/10/23 0416  NA 134* 134*  K 4.0 4.1  CL 94* 96*  CO2 26 28  GLUCOSE 89 108*  BUN 12 10  CREATININE 0.79 0.73  CALCIUM 7.4* 7.3*  MG  --  1.1*  PHOS  --  2.9   GFR: Estimated Creatinine Clearance: 81.3 mL/min (by C-G formula based on SCr of 0.73 mg/dL). Liver Function Tests: Recent Labs  Lab 02/09/23 2137 02/10/23 0416  AST 65* 63*  ALT 49* 47*  ALKPHOS 536* 526*  BILITOT 1.8* 1.5*  PROT 5.1* 5.0*  ALBUMIN 1.9* 1.9*   Radiology Studies: CT ABDOMEN PELVIS W CONTRAST Result Date: 02/09/2023 CLINICAL DATA:  Abdominal pain, nausea, and vomiting x2 months. Patient has ovarian cancer with metastases. Ultrasound therapeutic paracentesis x3 this month, last on 01/22/2023 with 600 mL clear yellow fluid removed. EXAM: CT ABDOMEN AND PELVIS WITH CONTRAST TECHNIQUE: Multidetector CT imaging of the abdomen and pelvis was performed using the standard protocol following bolus administration of intravenous contrast. RADIATION DOSE REDUCTION: This exam was performed according to the departmental dose-optimization program which includes automated exposure control, adjustment of the mA and/or kV according to patient size and/or use of iterative reconstruction technique. CONTRAST:  OMNIPAQUE IOHEXOL 300 MG/ML  SOLN COMPARISON:  Numerous prior CTs. The 2 most recent are a CT abdomen pelvis with contrast 12/24/2022 and CTA chest, abdomen and pelvis from 12/15/2022. FINDINGS: Lower chest: An infusion catheter again noted terminating about the superior cavoatrial junction. The cardiac size is normal. There is a small hiatal hernia. Mild elevation right hemidiaphragm with lung bases clear. Hepatobiliary: Moderate to  severe hepatic steatosis is increased in the interval. There is no mass enhancement. Gallbladder is absent as before with stable prominent common bile duct measuring 11 mm. Pancreas: Partially atrophic. No focal abnormality. No inflammation. Spleen: Stable 7 cm splenic cystic lesion with patchy calcifications. Lobulated appearance of the spleen. No splenomegaly. Embolization coils again noted at the splenic hilum. No new abnormality. Adrenals/Urinary Tract: Adrenal glands are unremarkable. Kidneys are normal, without renal calculi, focal lesion, or hydronephrosis. Bladder is unremarkable. Stomach/Bowel: Chronic thickened folds in the upper to mid stomach. Mild increase parent fluid distention. Increased dilatation in duodenum and proximal to mid small bowel up to 4.4 cm caliber. There are thickened folds in multiple dilated and nondilated segments. There is a thickened segment in the central abdomen best seen on 2: 34-37 which is believed to be the transitional segment causing intermediate grade obstruction. Decompressed segments are present downstream. This is probably mid ileal in location. An appendix is not seen. There is mild fecal stasis  ascending and transverse colon. Rest of the large bowel is largely contracted. Vascular/Lymphatic: Aortic atherosclerosis. No enlarged abdominal or pelvic lymph nodes. Reproductive: Status post hysterectomy. No adnexal masses. Multiple pelvic phleboliths. Other: Mild-to-moderate free fluid, less than on 12/24/2022. There is enhancement of the peritoneal reflections consistent with peritoneal carcinomatosis, ranging from linear to nodular enhancement as before. No overt progression in this is seen. There are minimal layering presacral ascites as well. There is no free hemorrhage or free air.  No incarcerated hernia. Musculoskeletal: Left hip replacement. Grade 1 degenerative anterolisthesis L4-5 with advanced facet hypertrophy and acquired spinal stenosis. Osteopenia.  No  regional bone metastasis is seen. IMPRESSION: 1. Intermediate grade small-bowel obstruction with transitional segment in the central abdomen, probably mid ileal in location, due to a thickened segment. No pneumatosis. 2. Mild-to-moderate ascites, less than on 12/24/2022. 3. Peritoneal carcinomatosis findings without overt progression. 4. Increased moderate to severe hepatic steatosis. 5. Stable 7 cm splenic cystic lesion with patchy calcifications. 6. Aortic atherosclerosis. 7. Small hiatal hernia. 8. Osteopenia and degenerative change. Aortic Atherosclerosis (ICD10-I70.0). Electronically Signed   By: Almira Bar M.D.   On: 02/09/2023 23:03   DG Chest Portable 1 View Result Date: 02/09/2023 CLINICAL DATA:  Shortness of breath with abdominal pain. EXAM: PORTABLE CHEST 1 VIEW COMPARISON:  Chest x-ray 11/12/2021 FINDINGS: Right chest port catheter tip projects over the distal SVC. The heart size and mediastinal contours are within normal limits. Both lungs are clear. The visualized skeletal structures are unremarkable. IMPRESSION: No active disease. Electronically Signed   By: Darliss Cheney M.D.   On: 02/09/2023 21:58   Scheduled Meds:  Chlorhexidine Gluconate Cloth  6 each Topical Daily   enoxaparin (LOVENOX) injection  40 mg Subcutaneous Q24H   pantoprazole (PROTONIX) IV  40 mg Intravenous Q24H   Continuous Infusions:   LOS: 1 day   Shon Hale M.D on 02/10/2023 at 6:33 PM  Go to [email or web address] - for contact info  [facility] - Office  [phone]  If 7PM-7AM, please contact night-coverage [email or web address] 02/10/2023, 6:33 PM

## 2023-02-10 NOTE — TOC CM/SW Note (Incomplete)
The beneficiary is confined to one level of the home environment and there is no toilet on that level and will benefit from a 3-N-1 with the diagnosis of SBO (small bowel obstruction).

## 2023-02-10 NOTE — Evaluation (Signed)
Physical Therapy Evaluation Patient Details Name: Michelle Holt MRN: [id number] DOB: Jun 05, 1970 Today's Date: 02/10/2023  History of Present Illness  Michelle Holt is a 53 y.o. 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  Patient was recently admitted from 01/14/2023 to 01/15/2023 due to malignant ascites s/p paracentesis.  Patient has not had any chemotherapy since 01/07/2023 due to being very sick from having nausea, vomiting, poor appetite whereby she has not been eating, loss of control of bowels.  She felt so weak that she could barely make it to her bathroom from her bedroom, so she called her oncologist on Friday, 1/24 and was asked to go to the ED.  She presented to the ED due to worsening weakness.   Clinical Impression  Patient has to lean on nearby objects for support when taking steps without AD, safer using RW with good return for ambulating in room, hallway without loss of balance.  Patient limited mostly due to fatigue and tolerated sitting up in chair after therapy. Patient will benefit from continued skilled physical therapy in hospital and recommended venue below to increase strength, balance, endurance for safe ADLs and gait.         If plan is discharge home, recommend the following: A little help with walking and/or transfers;A little help with bathing/dressing/bathroom;Help with stairs or ramp for entrance;Assistance with cooking/housework   Can travel by private vehicle        Equipment Recommendations Rolling walker (2 wheels)  Recommendations for Other Services       Functional Status Assessment Patient has had a recent decline in their functional status and demonstrates the ability to make significant improvements in function in a reasonable and predictable amount of time.      Precautions / Restrictions Precautions Precautions: Fall Restrictions Weight Bearing Restrictions Per Provider Order: No      Mobility  Bed Mobility Overal bed mobility: Modified Independent                  Transfers Overall transfer level: Needs assistance Equipment used: Rolling walker (2 wheels), None Transfers: Sit to/from Stand, Bed to chair/wheelchair/BSC Sit to Stand: Contact guard assist   Step pivot transfers: Contact guard assist, Min assist       General transfer comment: labored movement having to lean on nearby objects for support without AD, safer using RW    Ambulation/Gait Ambulation/Gait assistance: Supervision Gait Distance (Feet): 85 Feet Assistive device: Rolling walker (2 wheels), None   Gait velocity: decreased     General Gait Details: can ambulate short distances without AD, safer using RW for ambulating in hallway with good carryover for use demonstrated without loss of balance  Stairs            Wheelchair Mobility     Tilt Bed    Modified Rankin (Stroke Patients Only)       Balance Overall balance assessment: Needs assistance Sitting-balance support: Feet supported, No upper extremity supported Sitting balance-Leahy Scale: Good Sitting balance - Comments: seated at EOB   Standing balance support: During functional activity, No upper extremity supported Standing balance-Leahy Scale: Fair Standing balance comment: fair/good using RW                             Pertinent Vitals/Pain Pain Assessment  Pain Assessment: No/denies pain    Home Living Family/patient expects to be discharged to:: Private residence Living Arrangements: Children Available Help at Discharge: Family;Other (Comment);Available 24 hours/day Type of Home: House Home Access: Ramped entrance       Home Layout: One level Home Equipment: BSC/3in1;Grab bars - toilet      Prior Function Prior Level of Function : Needs  assist;Driving       Physical Assist : ADLs (physical);Mobility (physical)   ADLs (physical): Bathing;Dressing;Toileting;IADLs Mobility Comments: house hold and short distanced community ambulator without AD, drives, recently limited mostly due to household distances without AD ADLs Comments: Recently getting assist for bathing, dressing, and toileting. IADL assist as well.     Extremity/Trunk Assessment   Upper Extremity Assessment Upper Extremity Assessment: Defer to OT evaluation    Lower Extremity Assessment Lower Extremity Assessment: Generalized weakness    Cervical / Trunk Assessment Cervical / Trunk Assessment: Normal  Communication   Communication Communication: No apparent difficulties  Cognition Arousal: Alert Behavior During Therapy: WFL for tasks assessed/performed Overall Cognitive Status: Within Functional Limits for tasks assessed                                          General Comments      Exercises     Assessment/Plan    PT Assessment Patient needs continued PT services  PT Problem List Decreased strength;Decreased activity tolerance;Decreased balance;Decreased mobility       PT Treatment Interventions DME instruction;Gait training;Stair training;Functional mobility training;Therapeutic activities;Therapeutic exercise;Balance training;Patient/family education    PT Goals (Current goals can be found in the Care Plan section)  Acute Rehab PT Goals Patient Stated Goal: return home with caregiver to assist PT Goal Formulation: With patient Time For Goal Achievement: 02/14/23 Potential to Achieve Goals: Good    Frequency Min 3X/week     Co-evaluation PT/OT/SLP Co-Evaluation/Treatment: Yes Reason for Co-Treatment: To address functional/ADL transfers PT goals addressed during session: Mobility/safety with mobility;Balance;Proper use of DME OT goals addressed during session: ADL's and self-care       AM-PAC PT "6 Clicks"  Mobility  Outcome Measure Help needed turning from your back to your side while in a flat bed without using bedrails?: None Help needed moving from lying on your back to sitting on the side of a flat bed without using bedrails?: None Help needed moving to and from a bed to a chair (including a wheelchair)?: A Little Help needed standing up from a chair using your arms (e.g., wheelchair or bedside chair)?: A Little Help needed to walk in hospital room?: A Little Help needed climbing 3-5 steps with a railing? : A Little 6 Click Score: 20    End of Session   Activity Tolerance: Patient tolerated treatment well;Patient limited by fatigue Patient left: in chair;with call bell/phone within reach Nurse Communication: Mobility status PT Visit Diagnosis: Unsteadiness on feet (R26.81);Other abnormalities of gait and mobility (R26.89);Muscle weakness (generalized) (M62.81)    Time: 1610-9604 PT Time Calculation (min) (ACUTE ONLY): 28 min   Charges:   PT Evaluation $PT Eval Moderate Complexity: 1 Mod PT Treatments $Therapeutic Activity: 23-37 mins PT General Charges $$ ACUTE PT VISIT: 1 Visit         12:01 PM, 02/10/23 Ocie Bob, MPT Physical Therapist with [facility] [phone] office [phone] mobile phone

## 2023-02-10 NOTE — Plan of Care (Signed)

## 2023-02-10 NOTE — Plan of Care (Signed)
  Problem: Acute Rehab PT Goals(only PT should resolve) Goal: Pt Will Go Supine/Side To Sit Outcome: Progressing Flowsheets (Taken 02/10/2023 1202) Pt will go Supine/Side to Sit: Independently Goal: Patient Will Transfer Sit To/From Stand Outcome: Progressing Flowsheets (Taken 02/10/2023 1202) Patient will transfer sit to/from stand: with modified independence Goal: Pt Will Transfer Bed To Chair/Chair To Bed Outcome: Progressing Flowsheets (Taken 02/10/2023 1202) Pt will Transfer Bed to Chair/Chair to Bed: with modified independence Goal: Pt Will Ambulate Outcome: Progressing Flowsheets (Taken 02/10/2023 1202) Pt will Ambulate:  > 125 feet  with modified independence  with rolling walker  with least restrictive assistive device   12:03 PM, 02/10/23 Ocie Bob, MPT Physical Therapist with [facility] [phone] office [phone] mobile phone

## 2023-02-11 ENCOUNTER — Inpatient Hospital Stay: Payer: 59

## 2023-02-11 DIAGNOSIS — Z515 Encounter for palliative care: Secondary | ICD-10-CM | POA: Diagnosis not present

## 2023-02-11 DIAGNOSIS — E440 Moderate protein-calorie malnutrition: Secondary | ICD-10-CM | POA: Insufficient documentation

## 2023-02-11 DIAGNOSIS — K56609 Unspecified intestinal obstruction, unspecified as to partial versus complete obstruction: Secondary | ICD-10-CM | POA: Diagnosis not present

## 2023-02-11 DIAGNOSIS — Z7189 Other specified counseling: Secondary | ICD-10-CM | POA: Diagnosis not present

## 2023-02-11 DIAGNOSIS — C569 Malignant neoplasm of unspecified ovary: Secondary | ICD-10-CM | POA: Diagnosis not present

## 2023-02-11 LAB — COMPREHENSIVE METABOLIC PANEL
ALT: 43 U/L (ref 0–44)
AST: 48 U/L — ABNORMAL HIGH (ref 15–41)
Albumin: 2 g/dL — ABNORMAL LOW (ref 3.5–5.0)
Alkaline Phosphatase: 549 U/L — ABNORMAL HIGH (ref 38–126)
Anion gap: 9 (ref 5–15)
BUN: 9 mg/dL (ref 6–20)
CO2: 28 mmol/L (ref 22–32)
Calcium: 7.4 mg/dL — ABNORMAL LOW (ref 8.9–10.3)
Chloride: 98 mmol/L (ref 98–111)
Creatinine, Ser: 0.66 mg/dL (ref 0.44–1.00)
GFR, Estimated: >60 mL/min (ref >60)
Glucose, Bld: 66 mg/dL — ABNORMAL LOW (ref 70–99)
Potassium: 3.5 mmol/L (ref 3.5–5.1)
Sodium: 135 mmol/L (ref 135–145)
Total Bilirubin: 1.5 mg/dL — ABNORMAL HIGH (ref 0.0–1.2)
Total Protein: 5.2 g/dL — ABNORMAL LOW (ref 6.5–8.1)

## 2023-02-11 LAB — GLUCOSE, CAPILLARY: Glucose-Capillary: 85 mg/dL (ref 70–99)

## 2023-02-11 LAB — CBC
HCT: 27.6 % — ABNORMAL LOW (ref 36.0–46.0)
Hemoglobin: 9 g/dL — ABNORMAL LOW (ref 12.0–15.0)
MCH: 36.9 pg — ABNORMAL HIGH (ref 26.0–34.0)
MCHC: 32.6 g/dL (ref 30.0–36.0)
MCV: 113.1 fL — ABNORMAL HIGH (ref 80.0–100.0)
Platelets: 208 10*3/uL (ref 150–400)
RBC: 2.44 MIL/uL — ABNORMAL LOW (ref 3.87–5.11)
WBC: 8 10*3/uL (ref 4.0–10.5)
nRBC: 0 % (ref 0.0–0.2)

## 2023-02-11 LAB — MAGNESIUM: Magnesium: 2.7 mg/dL — ABNORMAL HIGH (ref 1.7–2.4)

## 2023-02-11 MED ORDER — LACTATED RINGERS IV SOLN: INTRAVENOUS | Status: AC

## 2023-02-11 MED ORDER — BOOST / RESOURCE BREEZE PO LIQD CUSTOM
1.0000 | Freq: Three times a day (TID) | ORAL | Status: DC
  Administered 2023-02-11 (×2): 1 via ORAL

## 2023-02-11 MED ORDER — DIAZEPAM 5 MG PO TABS
5.0000 mg | ORAL_TABLET | Freq: Every day | ORAL | Status: DC
  Administered 2023-02-11: 5 mg via ORAL
  Filled 2023-02-11: qty 1

## 2023-02-11 MED ORDER — DICYCLOMINE HCL 10 MG PO CAPS
20.0000 mg | ORAL_CAPSULE | Freq: Three times a day (TID) | ORAL | Status: DC | PRN
  Administered 2023-02-11 (×2): 20 mg via ORAL
  Filled 2023-02-11 (×2): qty 2

## 2023-02-11 MED ORDER — ENSURE ENLIVE PO LIQD
237.0000 mL | Freq: Three times a day (TID) | ORAL | Status: DC
  Administered 2023-02-11 – 2023-02-12 (×3): 237 mL via ORAL

## 2023-02-11 MED ORDER — DIPHENHYDRAMINE-ZINC ACETATE 2-0.1 % EX CREA: TOPICAL_CREAM | Freq: Three times a day (TID) | CUTANEOUS | Status: DC | PRN

## 2023-02-11 NOTE — TOC Transition Note (Signed)
Transition of Care [facility]) - Discharge Note   Patient Details  Name: Michelle Holt MRN: [id number] Date of Birth: 02-22-1970  [facility]) CM/SW Contact:  Leitha Bleak, RN Phone Number: 02/11/2023, 11:54 AM   Clinical Narrative:   Patient discharging home. Patient has orders for Out Patient palliative, DME and Home health. SW discussed choices with patient. She is agreeable. Requested [facility] for Palliative. Referral sent in the hub. Kandee Keen with [facility] accepted the referral for home health.  Ian [facility]alk[facility] accepted the referral for DME. Orders places. Information added to AVS.    Final next level of care: Home w Home Health Services Barriers to Discharge: Barriers Resolved   Patient Goals and CMS Choice Patient states their goals for this hospitalization and ongoing recovery are:: return back home CMS Medicare.gov Compare Post Acute Care list provided to:: Patient Choice offered to / list presented to : Patient      Discharge Placement                    Patient and family notified of of transfer: 02/11/23  Discharge Plan and Services Additional resources added to the After Visit Summary for   In-house Referral: Clinical Social Work, Hospice / Palliative Care Discharge Planning Services: CM Consult Post Acute Care Choice: Durable Medical Equipment, Home Health          DME Arranged: Walker rolling, 3-N-1 DME Agency: [facility] Date DME Agency Contacted: 02/10/23 Time DME Agency Contacted: 1110 Representative spoke with at DME Agency: [facility] HH Arranged: PT HH Agency: [facility] Date [facility] Agency Contacted: 02/10/23 Time HH Agency Contacted: 1118 Representative spoke with at [facility] Agency: Kandee Keen  Social Drivers of Health (SDOH) Interventions SDOH Screenings   Food Insecurity: No Food Insecurity (02/10/2023)  Housing: Low Risk  (02/10/2023)  Transportation Needs: No Transportation Needs (02/10/2023)  Utilities: Not At Risk (02/10/2023)  Depression (PHQ2-9):  High Risk (03/11/2022)  Financial Resource Strain: Low Risk  (08/26/2022)   Received from [facility]  Physical Activity: Inactive (07/22/2018)  Social Connections: Moderately Isolated (07/22/2018)  Stress: Stress Concern Present (07/22/2018)  Tobacco Use: High Risk (02/10/2023)     Readmission Risk Interventions    02/10/2023   11:05 AM  Readmission Risk Prevention Plan  Transportation Screening Complete  HRI or Home Care Consult Complete  Social Work Consult for Recovery Care Planning/Counseling Complete  Palliative Care Screening Complete  Medication Review [employer]) Complete

## 2023-02-11 NOTE — Progress Notes (Signed)
PROGRESS NOTE  Michelle Holt, is a 53 y.o. female, DOB - 09-10-1970, WUJ:[id number]  Admit date - 02/09/2023   Admitting Physician Frankey Shown, DO  Outpatient Primary MD for the patient is Vyas, Angelina Pih, MD  LOS - 2  Chief Complaint  Patient presents with   Abdominal Pain       Brief Narrative:  53 y.o. female with medical history significant of stage 4 serous ovarian cancer with peritoneal and pleural implants, GERD admitted with persistent emesis in setting of small bowel obstruction   -Assessment and Plan: 1) intractable emesis--- due to SBO in the setting of peritoneal carcinomatosis in a patient with stage IV ovarian cancer 02/11/23 -- GI symptoms improving--had BMs -Tolerating clear liquid diet will advance to full liquids --Per general surgeon no indication for surgical intervention at this time -Continue as needed antiemetics and pain medications -Oncology consult appreciated --Possible discharge on 02/12/2023 after paracentesis  2)Recurrent high-grade serous ovarian cancer stage IV with peritoneal carcinomatosis-- -patient sees Dr. Ellin Saba -started on carboplatin, gemcitabine and bevacizumab cycle 1 on 01/07/2023.  -Last chemo was on 01/17/2023.  --Possible discharge on 02/12/2023 after paracentesis  3)GERD--continue Protonix  4)Social/Ethics--palliative care consult appreciated -Patient is a DNR/DNI  5) chronic anemia--- due to underlying malignancy and chemotherapy  -hemoglobin currently around 9 =-No bleeding concerns -Monitor closely and transfuse as clinically indicated  6) hyponatremia--- due to dehydration,  -Sodium normalized with hydration  7) hypomagnesemia--normalized with replacement  Status is: Inpatient   Disposition: The patient is from: Home              Anticipated d/c is to: [facility] discharge on 02/12/2023 after paracentesis              Anticipated d/c date is: 1 day              Patient currently is not medically stable to  d/c. Barriers: Not Clinically Stable-   Code Status :  -  Code Status: Limited: Do not attempt resuscitation (DNR) -DNR-LIMITED -Do Not Intubate/DNI    Family Communication:      (patient is alert, awake and coherent)  Discussed with Daughter at bedside  DVT Prophylaxis  :   - SCDs  enoxaparin (LOVENOX) injection 40 mg Start: 02/10/23 1000 SCDs Start: 02/10/23 0321   Lab Results  Component Value Date   PLT 208 02/11/2023    Inpatient Medications  Scheduled Meds:  Chlorhexidine Gluconate Cloth  6 each Topical Daily   enoxaparin (LOVENOX) injection  40 mg Subcutaneous Q24H   feeding supplement  237 mL Oral TID BM   pantoprazole (PROTONIX) IV  40 mg Intravenous Q24H   Continuous Infusions:  lactated ringers 150 mL/hr at 02/11/23 1449   PRN Meds:.acetaminophen **OR** acetaminophen, dicyclomine, HYDROmorphone (DILAUDID) injection, ondansetron **OR** ondansetron (ZOFRAN) IV, mouth rinse   Anti-infectives (From admission, onward)    None         Subjective: Michelle Holt today has no fevers,   No chest pain,    -Daughter at bedside -Having BMs -Abdominal pain persist -Tolerating liquids -Possible discharge on 02/12/2023 after paracentesis  Objective: Vitals:   02/10/23 1330 02/10/23 2100 02/11/23 0440 02/11/23 1315  BP: (!) 87/48 (!) 106/59 112/60 (!) 93/49  Pulse: 87 95 90 100  Resp: 18 16 18 18   Temp: 97.9 F (36.6 C) (!) 97.5 F (36.4 C) 97.9 F (36.6 C) 97.8 F (36.6 C)  TempSrc: Oral Axillary Axillary Oral  SpO2: 98% 97% 98% 96%  Weight:  Height:        Intake/Output Summary (Last 24 hours) at 02/11/2023 1822 Last data filed at 02/11/2023 9147 Gross per 24 hour  Intake 100.07 ml  Output --  Net 100.07 ml   Filed Weights   02/09/23 2046 02/10/23 0015  Weight: 83 kg 76.3 kg    Physical Exam  Gen:- Awake Alert,  in no apparent distress  HEENT:- [place].AT, No sclera icterus Neck-Supple Neck,No JVD,.  Lungs-  CTAB , fair symmetrical air  movement CV- S1, S2 normal, regular , right subclavian Port-A-Cath in situ Abd-  +ve B.Sounds, Abd Soft, +ve abdominal distention, mild abdominal discomfort on palpation Extremity/Skin:- No  edema, pedal pulses present  Psych-affect is appropriate, oriented x3 Neuro-no new focal deficits, no tremors  Data Reviewed: I have personally reviewed following labs and imaging studies  CBC: Recent Labs  Lab 02/09/23 2137 02/10/23 0416 02/11/23 0433  WBC 9.7 9.5 8.0  NEUTROABS 6.5  --   --   HGB 9.3* 8.4* 9.0*  HCT 29.9* 27.1* 27.6*  MCV 115.9* 116.3* 113.1*  PLT 214 202 208   Basic Metabolic Panel: Recent Labs  Lab 02/09/23 2137 02/10/23 0416 02/11/23 0433  NA 134* 134* 135  K 4.0 4.1 3.5  CL 94* 96* 98  CO2 26 28 28   GLUCOSE 89 108* 66*  BUN 12 10 9   CREATININE 0.79 0.73 0.66  CALCIUM 7.4* 7.3* 7.4*  MG  --  1.1* 2.7*  PHOS  --  2.9  --    GFR: Estimated Creatinine Clearance: 81.3 mL/min (by C-G formula based on SCr of 0.66 mg/dL). Liver Function Tests: Recent Labs  Lab 02/09/23 2137 02/10/23 0416 02/11/23 0433  AST 65* 63* 48*  ALT 49* 47* 43  ALKPHOS 536* 526* 549*  BILITOT 1.8* 1.5* 1.5*  PROT 5.1* 5.0* 5.2*  ALBUMIN 1.9* 1.9* 2.0*   Radiology Studies: CT ABDOMEN PELVIS W CONTRAST Result Date: 02/09/2023 CLINICAL DATA:  Abdominal pain, nausea, and vomiting x2 months. Patient has ovarian cancer with metastases. Ultrasound therapeutic paracentesis x3 this month, last on 01/22/2023 with 600 mL clear yellow fluid removed. EXAM: CT ABDOMEN AND PELVIS WITH CONTRAST TECHNIQUE: Multidetector CT imaging of the abdomen and pelvis was performed using the standard protocol following bolus administration of intravenous contrast. RADIATION DOSE REDUCTION: This exam was performed according to the departmental dose-optimization program which includes automated exposure control, adjustment of the mA and/or kV according to patient size and/or use of iterative reconstruction  technique. CONTRAST:  OMNIPAQUE IOHEXOL 300 MG/ML  SOLN COMPARISON:  Numerous prior CTs. The 2 most recent are a CT abdomen pelvis with contrast 12/24/2022 and CTA chest, abdomen and pelvis from 12/15/2022. FINDINGS: Lower chest: An infusion catheter again noted terminating about the superior cavoatrial junction. The cardiac size is normal. There is a small hiatal hernia. Mild elevation right hemidiaphragm with lung bases clear. Hepatobiliary: Moderate to severe hepatic steatosis is increased in the interval. There is no mass enhancement. Gallbladder is absent as before with stable prominent common bile duct measuring 11 mm. Pancreas: Partially atrophic. No focal abnormality. No inflammation. Spleen: Stable 7 cm splenic cystic lesion with patchy calcifications. Lobulated appearance of the spleen. No splenomegaly. Embolization coils again noted at the splenic hilum. No new abnormality. Adrenals/Urinary Tract: Adrenal glands are unremarkable. Kidneys are normal, without renal calculi, focal lesion, or hydronephrosis. Bladder is unremarkable. Stomach/Bowel: Chronic thickened folds in the upper to mid stomach. Mild increase parent fluid distention. Increased dilatation in duodenum and proximal to  mid small bowel up to 4.4 cm caliber. There are thickened folds in multiple dilated and nondilated segments. There is a thickened segment in the central abdomen best seen on 2: 34-37 which is believed to be the transitional segment causing intermediate grade obstruction. Decompressed segments are present downstream. This is probably mid ileal in location. An appendix is not seen. There is mild fecal stasis ascending and transverse colon. Rest of the large bowel is largely contracted. Vascular/Lymphatic: Aortic atherosclerosis. No enlarged abdominal or pelvic lymph nodes. Reproductive: Status post hysterectomy. No adnexal masses. Multiple pelvic phleboliths. Other: Mild-to-moderate free fluid, less than on 12/24/2022.  There is enhancement of the peritoneal reflections consistent with peritoneal carcinomatosis, ranging from linear to nodular enhancement as before. No overt progression in this is seen. There are minimal layering presacral ascites as well. There is no free hemorrhage or free air.  No incarcerated hernia. Musculoskeletal: Left hip replacement. Grade 1 degenerative anterolisthesis L4-5 with advanced facet hypertrophy and acquired spinal stenosis. Osteopenia.  No regional bone metastasis is seen. IMPRESSION: 1. Intermediate grade small-bowel obstruction with transitional segment in the central abdomen, probably mid ileal in location, due to a thickened segment. No pneumatosis. 2. Mild-to-moderate ascites, less than on 12/24/2022. 3. Peritoneal carcinomatosis findings without overt progression. 4. Increased moderate to severe hepatic steatosis. 5. Stable 7 cm splenic cystic lesion with patchy calcifications. 6. Aortic atherosclerosis. 7. Small hiatal hernia. 8. Osteopenia and degenerative change. Aortic Atherosclerosis (ICD10-I70.0). Electronically Signed   By: Almira Bar M.D.   On: 02/09/2023 23:03   DG Chest Portable 1 View Result Date: 02/09/2023 CLINICAL DATA:  Shortness of breath with abdominal pain. EXAM: PORTABLE CHEST 1 VIEW COMPARISON:  Chest x-ray 11/12/2021 FINDINGS: Right chest port catheter tip projects over the distal SVC. The heart size and mediastinal contours are within normal limits. Both lungs are clear. The visualized skeletal structures are unremarkable. IMPRESSION: No active disease. Electronically Signed   By: Darliss Cheney M.D.   On: 02/09/2023 21:58   Scheduled Meds:  Chlorhexidine Gluconate Cloth  6 each Topical Daily   enoxaparin (LOVENOX) injection  40 mg Subcutaneous Q24H   feeding supplement  237 mL Oral TID BM   pantoprazole (PROTONIX) IV  40 mg Intravenous Q24H   Continuous Infusions:  lactated ringers 150 mL/hr at 02/11/23 1449     LOS: 2 days   Shon Hale M.D  on 02/11/2023 at 6:22 PM  Go to [email or web address] - for contact info  [facility] - Office  [phone]  If 7PM-7AM, please contact night-coverage [email or web address] 02/11/2023, 6:22 PM

## 2023-02-11 NOTE — Progress Notes (Signed)
Initial Nutrition Assessment  DOCUMENTATION CODES:   Non-severe (moderate) malnutrition in context of chronic illness  INTERVENTION:   Change supplement to Ensure Enlive po TID, each supplement provides 350 kcal and 20 grams of protein.  NUTRITION DIAGNOSIS:   Moderate Malnutrition related to chronic illness (ovarian cancer) as evidenced by mild fat depletion, mild muscle depletion, percent weight loss (10% weight loss within 3 months).  GOAL:   Patient will meet greater than or equal to 90% of their needs  MONITOR:   PO intake, Supplement acceptance  REASON FOR ASSESSMENT:   Consult Assessment of nutrition requirement/status  ASSESSMENT:   53 yo female admitted with intractable emesis d/t SBO in the setting of peritoneal carcinomatosis and stage IV ovarian cancer. PMH includes HLD, IBS, GERD.  Patient reports that she has been tolerating clear liquids and is ready to eat solid food. She c/o being hungry. At home she drinks Premier Protein shakes to increase her protein intake. Diet advanced to soft today.    Weight history reviewed. She has had 10% weight loss within the past 3 months. Patient meets criteria for moderate malnutrition, given mild depletion of muscle and subcutaneous fat mass with 10% weight loss in the past 3 months.   Labs and medications reviewed.   Plans for d/c home with home health and outpatient palliative services to follow.   NUTRITION - FOCUSED PHYSICAL EXAM:  Flowsheet Row Most Recent Value  Orbital Region Mild depletion  Upper Arm Region No depletion  Thoracic and Lumbar Region Mild depletion  Buccal Region No depletion  Temple Region No depletion  Clavicle Bone Region Mild depletion  Clavicle and Acromion Bone Region No depletion  Scapular Bone Region No depletion  Dorsal Hand No depletion  Patellar Region No depletion  Anterior Thigh Region No depletion  Posterior Calf Region Mild depletion  Edema (RD Assessment) Mild  Hair Reviewed   Eyes Reviewed  Mouth Reviewed  Skin Reviewed  Nails Reviewed       Diet Order:   Diet Order             DIET SOFT Room service appropriate? Yes; Fluid consistency: Thin  Diet effective now                   EDUCATION NEEDS:   No education needs have been identified at this time  Skin:  Skin Assessment: Skin Integrity Issues: Skin Integrity Issues:: Stage II Stage II: L buttocks  Last BM:  1/26  Height:   Ht Readings from Last 1 Encounters:  02/10/23 5\' 4"  (1.626 m)    Weight:   Wt Readings from Last 1 Encounters:  02/10/23 76.3 kg    Ideal Body Weight:  54.5 kg  BMI:  Body mass index is 28.87 kg/m.  Estimated Nutritional Needs:   Kcal:  1900-2100  Protein:  90-110 gm  Fluid:  1.9-2.1 L   Gabriel Rainwater RD, LDN, CNSC Contact Inpatient RD using Secure Chat. If unavailable, use group chat "RD Inpatient" via Secure Chat in [employer].

## 2023-02-11 NOTE — Plan of Care (Signed)
Problem: Education: Goal: Knowledge of General Education information will improve Description: Including pain rating scale, medication(s)/side effects and non-pharmacologic comfort measures Outcome: Progressing   Problem: Activity: Goal: Risk for activity intolerance will decrease Outcome: Progressing   Problem: Nutrition: Goal: Adequate nutrition will be maintained Outcome: Progressing   Problem: Coping: Goal: Level of anxiety will decrease Outcome: Not Progressing

## 2023-02-11 NOTE — Care Management Important Message (Signed)
Important Message  Patient Details  Name: Michelle Holt MRN: [id number] Date of Birth: 03-May-1970   Important Message Given:  N/A - LOS <3 / Initial given by admissions     Corey Harold 02/11/2023, 12:21 PM

## 2023-02-11 NOTE — Plan of Care (Signed)
  Problem: Clinical Measurements: Goal: Respiratory complications will improve Outcome: Progressing Goal: Cardiovascular complication will be avoided Outcome: Progressing   Problem: Coping: Goal: Level of anxiety will decrease Outcome: Progressing   Problem: Elimination: Goal: Will not experience complications related to bowel motility Outcome: Progressing Goal: Will not experience complications related to urinary retention Outcome: Progressing   Problem: Safety: Goal: Ability to remain free from injury will improve Outcome: Progressing   Problem: Skin Integrity: Goal: Risk for impaired skin integrity will decrease Outcome: Progressing   Problem: Pain Managment: Goal: General experience of comfort will improve and/or be controlled Outcome: Not Progressing

## 2023-02-11 NOTE — Progress Notes (Signed)
Palliative: [name], Michelle, is lying quietly in bed.  She appears chronically ill.  She greets me, making and mostly keeping eye contact.  She is alert and oriented, able to make her needs known.  Her daughter, Michelle Holt, is present at bedside.  We talked about her symptom management needs.  At this time, Michelle Holt shares that antiemetic regimen is effective.  We also talk about her pain management needs.  Michelle Holt shares that she did not experience much pain with her first cancer, but has had increased pain this time.  She shares that she questions if this is related to her small bowel obstruction.  Sharing that as the obstruction is resolving, she is having less pain.  She denies need for adjustment of pain medicine regimen.  We review her meeting with her trusted oncologist, Dr. Ellin Saba.  Michelle Holt shares that as long as she is improving, and the side effects are not too great, she would continue chemotherapy as offered.  Michelle Holt shares that Dr. Kirtland Bouchard has recommended hospice care and she is open to hospice care in the future, understanding that she cannot take hospice while taking chemotherapy.  She is agreeable to start with outpatient palliative services, provider of choice [facility].  Conference with attending, bedside nursing staff, transition of care team related to patient condition, needs, goals of care, disposition.  Plan: Continue to treat the treatable but no CPR or intubation.  Home with home health/outpatient palliative services with [facility].        50 minutes  Lillia Carmel, NP Palliative medicine team Team phone [phone]

## 2023-02-12 ENCOUNTER — Inpatient Hospital Stay: Payer: 59

## 2023-02-12 ENCOUNTER — Inpatient Hospital Stay: Payer: 59 | Admitting: Hematology

## 2023-02-12 ENCOUNTER — Other Ambulatory Visit: Payer: Self-pay

## 2023-02-12 ENCOUNTER — Inpatient Hospital Stay (HOSPITAL_COMMUNITY): Payer: 59

## 2023-02-12 DIAGNOSIS — K56609 Unspecified intestinal obstruction, unspecified as to partial versus complete obstruction: Secondary | ICD-10-CM | POA: Diagnosis not present

## 2023-02-12 DIAGNOSIS — R627 Adult failure to thrive: Secondary | ICD-10-CM | POA: Diagnosis not present

## 2023-02-12 DIAGNOSIS — Z515 Encounter for palliative care: Secondary | ICD-10-CM | POA: Diagnosis not present

## 2023-02-12 DIAGNOSIS — Z7189 Other specified counseling: Secondary | ICD-10-CM | POA: Diagnosis not present

## 2023-02-12 LAB — RENAL FUNCTION PANEL
Albumin: 1.9 g/dL — ABNORMAL LOW (ref 3.5–5.0)
Anion gap: 10 (ref 5–15)
BUN: 10 mg/dL (ref 6–20)
CO2: 26 mmol/L (ref 22–32)
Calcium: 7.6 mg/dL — ABNORMAL LOW (ref 8.9–10.3)
Chloride: 98 mmol/L (ref 98–111)
Creatinine, Ser: 0.76 mg/dL (ref 0.44–1.00)
GFR, Estimated: >60 mL/min (ref >60)
Glucose, Bld: 77 mg/dL (ref 70–99)
Phosphorus: 2.7 mg/dL (ref 2.5–4.6)
Potassium: 3.8 mmol/L (ref 3.5–5.1)
Sodium: 134 mmol/L — ABNORMAL LOW (ref 135–145)

## 2023-02-12 MED ORDER — METOCLOPRAMIDE HCL 10 MG PO TABS: 10.0000 mg | ORAL_TABLET | Freq: Three times a day (TID) | ORAL | 0 refills | Status: DC | PRN

## 2023-02-12 MED ORDER — IOHEXOL 300 MG/ML  SOLN
100.0000 mL | Freq: Once | INTRAMUSCULAR | Status: AC | PRN
  Administered 2023-02-12: 100 mL via INTRAVENOUS

## 2023-02-12 MED ORDER — PANTOPRAZOLE SODIUM 40 MG PO TBEC: 40.0000 mg | DELAYED_RELEASE_TABLET | Freq: Every day | ORAL | 1 refills | Status: DC

## 2023-02-12 MED ORDER — ENSURE ENLIVE PO LIQD: 237.0000 mL | Freq: Three times a day (TID) | ORAL | Status: DC

## 2023-02-12 MED ORDER — NALOXEGOL OXALATE 25 MG PO TABS: 25.0000 mg | ORAL_TABLET | Freq: Every day | ORAL | 0 refills | Status: DC

## 2023-02-12 MED ORDER — ALBUMIN HUMAN 25 % IV SOLN: 50.0000 g | Freq: Four times a day (QID) | INTRAVENOUS | Status: AC

## 2023-02-12 NOTE — Progress Notes (Signed)
Palliative: [name], Michelle, is lying quietly in bed.  She appears acutely/chronically ill and somewhat frail.  She greets me, making and mostly keeping eye contact.  She is alert and oriented, able to make her needs known.  There is no family at bedside at this time.  Face-to-face discussion with bedside nursing staff related to patient condition, needs.  [name] and I talk about her acute health concerns including, but not limited to, ultrasound without clear pocket for paracentesis and latest CT abdomen results.  [name] continues to share her concern about recurrent bowel obstruction.  We talked about continuing cancer treatments as offered by trusted oncologist.  The plan is for [name] to DC home today.  She is agreeable for outpatient palliative services with [facility].  Conference with attending, bedside nursing staff, transition of care team related to patient condition, needs, goals of care, disposition.  Plan: Continue to treat the treatable but no CPR or intubation.  Discharging home today with outpatient palliative services through [facility].  Follow-up 2/4 with trusted oncologist Dr. Ellin Saba for further treatment as offered.          35 minutes Lillia Carmel, NP Palliative medicine team Team phone [phone]

## 2023-02-12 NOTE — Discharge Summary (Signed)
Physician Discharge Summary   Patient: Michelle Holt MRN: [id number] DOB: Jan 16, 1970  Admit date:     02/09/2023  Discharge date: 02/12/23  Discharge Physician: Vassie Loll   PCP: Ignatius Specking, MD   Recommendations at discharge:  Repeat basic metabolic panel to follow electrolytes and renal function Repeat CBC to follow hemoglobin trend/stability. Continue adjusting patient's analgesics and stool softener regimen. Make sure patient follow-up with oncology service as instructed Further goals of care discussion/advance care planning recommended; please fill out/discuss MOST form.   Discharge Diagnoses: Principal Problem:   SBO (small bowel obstruction) (HCC) Active Problems:   GERD (gastroesophageal reflux disease)   Nausea with vomiting   Abdominal pain   Malignant neoplasm of ovary (HCC)   Hypoalbuminemia due to protein-calorie malnutrition (HCC)   Failure to thrive in adult   Malnutrition of moderate degree  Brief Hospital admission course: As per H&P written by Dr. Thomes Dinning on 02/09/2023 Michelle Holt is a 53 y.o. 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  Patient was recently admitted from 01/14/2023 to 01/15/2023 due to malignant ascites s/p paracentesis.  Patient has not had any chemotherapy since 01/07/2023 due to being very sick from having nausea, vomiting, poor appetite whereby she has not been eating, loss of control of bowels.  She felt so weak that she could barely make it to her bathroom from her bedroom, so she called her oncologist on Friday, 1/24 and was asked to go to the ED.  She presented to the ED due to worsening weakness.   ED Course:  In the emergency department, pulse is 102 bpm, BP 91/51, other vital signs are within normal range.  Workup in the ED shows hemoglobin 9.3,  hematocrit 29.9, MCV 115.9.  BMP shows sodium 134, potassium 4.0, chloride 94, bicarb 26, 8 glucose 89, BUN 12, creatinine 0.79, calcium 8.9, AST 65, ALT 49, ALP 536, total bilirubin 1.8, lipase 16, urinalysis was normal CT abdomen and pelvis with contrast showed intermediate grade small bowel obstruction with transitional segment in the central abdomen, probably mid ileal in location due to a thickened segment.  Mild to moderate ascites, less than on 12/24/2022.  Peritoneal carcinomatosis findings without overt progression.  Increased moderate to severe hepatic steatosis. Patient was treated with Dilaudid and Zofran, IV hydration was provided. Hospitalist was asked to admit patient for further evaluation and management.   Assessment and Plan: 1) intractable emesis--- due to SBO in the setting of peritoneal carcinomatosis in a patient with stage IV ovarian cancer - GI symptoms has continue improving--had BMs -Tolerating soft diet at discharge. --Per general surgeon no indication for surgical intervention at this time. -Patient has been instructed to continue the use of stool softeners/bowel regimen and to maintain adequate hydration. -Continue as needed antiemetics and pain medications -Oncology consult appreciated   2)Recurrent high-grade serous ovarian cancer stage IV with peritoneal carcinomatosis-- -started on carboplatin, gemcitabine and bevacizumab cycle 1 on 01/07/2023.  -Last chemo was on 01/17/2023.  -Continue patient follow-up with oncology service.   3)GERD- -continue Protonix -Prescription provided at discharge   4)Social/Ethics- -palliative care consult appreciated -Patient is a DNR/DNI -Goals of care discussion, advance care planning and MOST form recommended as an outpatient.   5) chronic anemia--- due to underlying malignancy and chemotherapy  -hemoglobin currently around 9 =-No bleeding concerns -Monitor closely and  transfuse as clinically indicated   6)  hyponatremia--- due to dehydration,  -Sodium normalized with hydration   7) hypomagnesemia--normalized with replacement  Consultants: Oncology service and general surgery. Procedures performed: See below for x-ray reports. Disposition: Home Diet recommendation: Soft diet.  DISCHARGE MEDICATION: Allergies as of 02/12/2023       Reactions   Morphine And Codeine Itching   Nickel Itching   Nortriptyline Other (See Comments)   Significant weight gain   Topamax [topiramate] Diarrhea, Nausea Only   Xanax [alprazolam] Other (See Comments)   "Can't wake up"   Actifed Cold-allergy [chlorpheniramine-phenyleph Er] Rash, Other (See Comments)   Red dye only   Amoxicillin Rash   Codeine Hives   Erythromycin Rash   Gemzar [gemcitabine] Other (See Comments)   Feeling flushed, red in the face   Penicillins Rash      Red Dye Rash   Sudafed [pseudoephedrine Hcl] Rash, Other (See Comments)   Red dye only        Medication List     STOP taking these medications    dexamethasone 4 MG tablet Commonly known as: DECADRON   gabapentin 100 MG capsule Commonly known as: NEURONTIN   omeprazole 40 MG capsule Commonly known as: PRILOSEC   Xtampza ER 36 MG C12a Generic drug: oxyCODONE ER       TAKE these medications    albuterol 108 (90 Base) MCG/ACT inhaler Commonly known as: VENTOLIN HFA Inhale 2 puffs into the lungs every 6 (six) hours as needed for wheezing or shortness of breath.   diazepam 5 MG tablet Commonly known as: VALIUM Take 1 tablet (5 mg total) by mouth at bedtime.   dicyclomine 20 MG tablet Commonly known as: BENTYL Take 1 tablet (20 mg total) by mouth every 6 (six) hours as needed for spasms.   feeding supplement Liqd Take 237 mLs by mouth 3 (three) times daily between meals.   HYDROmorphone 8 MG tablet Commonly known as: DILAUDID Take 1 tablet (8 mg total) by mouth every 4 (four) hours as needed (Breakthrough Pain).   LORazepam 0.5 MG tablet Commonly  known as: ATIVAN Take 0.5 mg by mouth 3 (three) times daily as needed.   magic mouthwash w/lidocaine Soln Take 5 mLs by mouth 4 (four) times daily as needed for mouth pain. Suspension contains equal amounts of Maalox Extra Strength, nystatin, diphenhydramine and lidocaine.   metoCLOPramide 10 MG tablet Commonly known as: REGLAN Take 1 tablet (10 mg total) by mouth every 8 (eight) hours as needed for refractory nausea / vomiting.   naloxegol oxalate 25 MG Tabs tablet Commonly known as: Movantik Take 1 tablet (25 mg total) by mouth daily.   ondansetron 4 MG disintegrating tablet Commonly known as: ZOFRAN-ODT Take 1 tablet (4 mg total) by mouth every 8 (eight) hours as needed for nausea or vomiting.   oxyCODONE 60 MG 12 hr tablet Commonly known as: OXYCONTIN Take 1 tablet by mouth every 12 (twelve) hours as needed.   pantoprazole 40 MG tablet Commonly known as: Protonix Take 1 tablet (40 mg total) by mouth daily.               Durable Medical Equipment  (From admission, onward)           Start     Ordered   02/11/23 1058  For home use only DME 3 n 1  Once        02/11/23 1059   02/11/23 1058  For home use only DME Dan Humphreys  rolling  Once       Question Answer Comment  Walker: With 5 Inch Wheels   Patient needs a walker to treat with the following condition Weakness   Patient needs a walker to treat with the following condition Ovarian cancer (HCC)      02/11/23 1059              Discharge Care Instructions  (From admission, onward)           Start     Ordered   02/12/23 0000  Discharge wound care:       Comments: Keep area clean and dry; constant repositioning recommended.   02/12/23 1428            Follow-up Information     [facility] Follow up.   Specialty: Home Health Services Why: They will call to schedule your first home visit. Contact information: [address] [place] [address] [phone]          [facility] Follow up.   Why: Will deliver rolling walker and 3N1 Contact information: [address] [place] [address] [phone]         [facility] Follow up.   Contact information: Out Patient Palliative        Vyas, Dhruv B, MD. Schedule an appointment as soon as possible for a visit in 10 day(s).   Specialty: Internal Medicine Contact information: [address] [place] [address] [phone]                Discharge Exam: [facility] Weights   02/09/23 2046 02/10/23 0015  Weight: 83 kg 76.3 kg   General exam: Alert, awake, oriented x 3; still complaining of intermittent abd pain. Respiratory system: Clear to auscultation. Respiratory effort normal. Good sat on RA. Cardiovascular system:RRR. No murmurs, rubs, gallops. Gastrointestinal system: Abdomen is nondistended, soft and without guarding  Central nervous system: generally weak.No focal neurological deficits. Extremities: No C/C/E. Skin: No petechiae;right sided subclavian porta-A cath in place. Psychiatry: Flat affect appreciated.   Condition at discharge: stable  The results of significant diagnostics from this hospitalization (including imaging, microbiology, ancillary and laboratory) are listed below for reference.   Imaging Studies: CT ABDOMEN PELVIS W CONTRAST Result Date: 02/12/2023 CLINICAL DATA:  Stage IV ovarian cancer. New onset abdominal pain * Tracking Code: BO * EXAM: CT ABDOMEN AND PELVIS WITH CONTRAST TECHNIQUE: Multidetector CT imaging of the abdomen and pelvis was performed using the standard protocol following bolus administration of intravenous contrast. RADIATION DOSE REDUCTION: This exam was performed according to the departmental dose-optimization program which includes automated exposure control, adjustment of the mA and/or kV according to patient size and/or use of iterative reconstruction technique. CONTRAST:  OMNIPAQUE IOHEXOL 300 MG/ML  SOLN  COMPARISON:  Ultrasound same day 02/12/2023. CT 02/09/2023 and older. FINDINGS: Lower chest: Lung bases are grossly clear.  No pleural effusion. Hepatobiliary: Extensive fatty liver infiltration. Previous cholecystectomy. Patent portal vein. Pancreas: Moderate atrophy of the pancreas. Spleen: Lobular spleen. Anterior to the spleen is a 7 cm complex lesion with cystic areas and potential calcification, unchanged from previous. Embolization coils near the splenic hilum. Adrenals/Urinary Tract: Adrenal glands are preserved. No enhancing renal mass or collecting system dilatation. Bladder is underdistended. Stomach/Bowel: Large bowel is normal course and caliber. There is some scattered stool. Surgical changes along the base of the cecum. Stomach is nondilated. Overall small bowel has areas of small bowel stool appearance diffusely.  Some loops are mildly distended with diameter approaching 3.3 cm. Previously there were more air-fluid levels along the small bowel with more loops that are distended. Vascular/Lymphatic: Normal caliber aorta and IVC with scattered atherosclerotic calcifications. Scattered small nodes are again identified, unchanged from previous. Reproductive: Status post hysterectomy. No adnexal masses. Other: Anasarca. There is scattered ascites identified with diffuse areas of peritoneal and wall thickening along the course of the ascites. The level is similar to previous in the thickening is likely related to peritoneal spread of disease as per provided history. There is specific nodular areas of tissue as well seen adjacent to the distal stomach anteriorly, lesser curve and towards the porta hepatis. Again as on previous. Slight wall thickening along several loops of bowel again seen which is likely as well spread of disease. Musculoskeletal: Degenerative changes of the spine and pelvis. Streak artifact related to the patient's left hip arthroplasty. IMPRESSION: Again note is made of ascites with  extensive nodular wall thickening along the course of the ascites, perineum peritoneum and along the surface of bowel loops consistent with known metastatic disease of ovarian cancer, peritoneal carcinomatosis. The previous dilated loops of small bowel are decreasing today in size. Less air-fluid levels but more small bowel stool appearance. No free air. Stable numerous other findings in the short time interval. Electronically Signed   By: Karen Kays M.D.   On: 02/12/2023 13:08   [place] ASCITES (ABDOMEN LIMITED) Result Date: 02/12/2023 CLINICAL DATA:  Ovarian CA, peritoneal carcinomatosis, concern for recurrent large volume of ascites, abdominal discomfort EXAM: LIMITED ABDOMEN ULTRASOUND FOR ASCITES TECHNIQUE: Limited ultrasound survey for ascites was performed in all four abdominal quadrants. COMPARISON:  02/12/2023 CT FINDINGS: Survey of the abdominal 4 quadrants performed. Only a small volume of scattered abdominopelvic ascites some of which is septated and complex. No large volume of ascites demonstrated by ultrasound. Therapeutic paracentesis not performed. IMPRESSION: Small volume of abdominopelvic ascites. Electronically Signed   By: Judie Petit.  Shick M.D.   On: 02/12/2023 10:34   CT ABDOMEN PELVIS W CONTRAST Result Date: 02/09/2023 CLINICAL DATA:  Abdominal pain, nausea, and vomiting x2 months. Patient has ovarian cancer with metastases. Ultrasound therapeutic paracentesis x3 this month, last on 01/22/2023 with 600 mL clear yellow fluid removed. EXAM: CT ABDOMEN AND PELVIS WITH CONTRAST TECHNIQUE: Multidetector CT imaging of the abdomen and pelvis was performed using the standard protocol following bolus administration of intravenous contrast. RADIATION DOSE REDUCTION: This exam was performed according to the departmental dose-optimization program which includes automated exposure control, adjustment of the mA and/or kV according to patient size and/or use of iterative reconstruction technique. CONTRAST:   OMNIPAQUE IOHEXOL 300 MG/ML  SOLN COMPARISON:  Numerous prior CTs. The 2 most recent are a CT abdomen pelvis with contrast 12/24/2022 and CTA chest, abdomen and pelvis from 12/15/2022. FINDINGS: Lower chest: An infusion catheter again noted terminating about the superior cavoatrial junction. The cardiac size is normal. There is a small hiatal hernia. Mild elevation right hemidiaphragm with lung bases clear. Hepatobiliary: Moderate to severe hepatic steatosis is increased in the interval. There is no mass enhancement. Gallbladder is absent as before with stable prominent common bile duct measuring 11 mm. Pancreas: Partially atrophic. No focal abnormality. No inflammation. Spleen: Stable 7 cm splenic cystic lesion with patchy calcifications. Lobulated appearance of the spleen. No splenomegaly. Embolization coils again noted at the splenic hilum. No new abnormality. Adrenals/Urinary Tract: Adrenal glands are unremarkable. Kidneys are normal, without renal calculi, focal lesion, or hydronephrosis. Bladder is  unremarkable. Stomach/Bowel: Chronic thickened folds in the upper to mid stomach. Mild increase parent fluid distention. Increased dilatation in duodenum and proximal to mid small bowel up to 4.4 cm caliber. There are thickened folds in multiple dilated and nondilated segments. There is a thickened segment in the central abdomen best seen on 2: 34-37 which is believed to be the transitional segment causing intermediate grade obstruction. Decompressed segments are present downstream. This is probably mid ileal in location. An appendix is not seen. There is mild fecal stasis ascending and transverse colon. Rest of the large bowel is largely contracted. Vascular/Lymphatic: Aortic atherosclerosis. No enlarged abdominal or pelvic lymph nodes. Reproductive: Status post hysterectomy. No adnexal masses. Multiple pelvic phleboliths. Other: Mild-to-moderate free fluid, less than on 12/24/2022. There is enhancement of the  peritoneal reflections consistent with peritoneal carcinomatosis, ranging from linear to nodular enhancement as before. No overt progression in this is seen. There are minimal layering presacral ascites as well. There is no free hemorrhage or free air.  No incarcerated hernia. Musculoskeletal: Left hip replacement. Grade 1 degenerative anterolisthesis L4-5 with advanced facet hypertrophy and acquired spinal stenosis. Osteopenia.  No regional bone metastasis is seen. IMPRESSION: 1. Intermediate grade small-bowel obstruction with transitional segment in the central abdomen, probably mid ileal in location, due to a thickened segment. No pneumatosis. 2. Mild-to-moderate ascites, less than on 12/24/2022. 3. Peritoneal carcinomatosis findings without overt progression. 4. Increased moderate to severe hepatic steatosis. 5. Stable 7 cm splenic cystic lesion with patchy calcifications. 6. Aortic atherosclerosis. 7. Small hiatal hernia. 8. Osteopenia and degenerative change. Aortic Atherosclerosis (ICD10-I70.0). Electronically Signed   By: Almira Bar M.D.   On: 02/09/2023 23:03   DG Chest Portable 1 View Result Date: 02/09/2023 CLINICAL DATA:  Shortness of breath with abdominal pain. EXAM: PORTABLE CHEST 1 VIEW COMPARISON:  Chest x-ray 11/12/2021 FINDINGS: Right chest port catheter tip projects over the distal SVC. The heart size and mediastinal contours are within normal limits. Both lungs are clear. The visualized skeletal structures are unremarkable. IMPRESSION: No active disease. Electronically Signed   By: Darliss Cheney M.D.   On: 02/09/2023 21:58   US Paracentesis Result Date: 01/22/2023 INDICATION: Patient with history of metastatic ovarian cancer, recurrent ascites. Request for therapeutic paracentesis. EXAM: ULTRASOUND GUIDED THERAPEUTIC PARACENTESIS MEDICATIONS: 10 mL 1% lidocaine COMPLICATIONS: None immediate. PROCEDURE: Informed written consent was obtained from the patient after a discussion of the  risks, benefits and alternatives to treatment. A timeout was performed prior to the initiation of the procedure. Initial ultrasound scanning demonstrates a small amount of loculated ascites within the right lower abdominal quadrant. The largest pocket was selected for drainage and the right lower abdomen was prepped and draped in the usual sterile fashion. 1% lidocaine was used for local anesthesia. Following this, a 19 gauge, 7-cm, Yueh catheter was introduced. An ultrasound image was saved for documentation purposes. The paracentesis was performed. The catheter was removed and a dressing was applied. The patient tolerated the procedure well without immediate post procedural complication. Patient received post-procedure intravenous albumin; see nursing notes for details. FINDINGS: A total of approximately 600 mL of clear yellow fluid was removed. IMPRESSION: Successful ultrasound-guided paracentesis yielding 600 milliliters of peritoneal fluid. Performed by Lynnette Caffey, PA-C Electronically Signed   By: Marliss Coots M.D.   On: 01/22/2023 15:58   US Paracentesis Result Date: 01/17/2023 INDICATION: Patient with history of metastatic ovarian cancer, recurrent ascites. Request for therapeutic paracentesis. EXAM: ULTRASOUND GUIDED THERAPEUTIC PARACENTESIS MEDICATIONS: 8 mL 1% lidocaine. COMPLICATIONS:  None immediate. PROCEDURE: Informed written consent was obtained from the patient after a discussion of the risks, benefits and alternatives to treatment. A timeout was performed prior to the initiation of the procedure. Initial ultrasound scanning demonstrates a small amount of ascites within the right lower abdominal quadrant. The right lower abdomen was prepped and draped in the usual sterile fashion. 1% lidocaine was used for local anesthesia. Following this, a 19 gauge, 7-cm, Yueh catheter was introduced. An ultrasound image was saved for documentation purposes. The paracentesis was performed. The catheter was  removed and a dressing was applied. The patient tolerated the procedure well without immediate post procedural complication. Patient received post-procedure intravenous albumin; see nursing notes for details. FINDINGS: A total of approximately 1.7 L of clear yellow fluid was removed. IMPRESSION: Successful ultrasound-guided paracentesis yielding 1.7 liters of peritoneal fluid. Performed by Lynnette Caffey, PA-C Electronically Signed   By: Marliss Coots M.D.   On: 01/17/2023 14:13   US Paracentesis Result Date: 01/15/2023 INDICATION: Patient with a history of metastatic ovarian cancer with recurrent malignant ascites. Interventional radiology asked to perform a therapeutic paracentesis. EXAM: ULTRASOUND GUIDED PARACENTESIS MEDICATIONS: 1% lidocaine 10 mL COMPLICATIONS: None immediate. PROCEDURE: Informed written consent was obtained from the patient after a discussion of the risks, benefits and alternatives to treatment. A timeout was performed prior to the initiation of the procedure. Initial ultrasound scanning demonstrates a large amount of ascites within the right lower abdominal quadrant. The right lower abdomen was prepped and draped in the usual sterile fashion. 1% lidocaine was used for local anesthesia. Following this, a 19 gauge, 7-cm, Yueh catheter was introduced. An ultrasound image was saved for documentation purposes. The paracentesis was performed. After approximately 2 L of fluid removal the catheter stopped draining. Ultrasound imaging demonstrated an adequate pocket of residual fluid. The site was re-prepped with chlorhexidine and a 10 cm Yueh was inserted using sterile technique. The paracentesis was performed. The catheter was removed and a dressing was applied. The patient tolerated the procedure well without immediate post procedural complication. FINDINGS: A total of approximately 4.7 L of clear yellow fluid was removed. IMPRESSION: Successful ultrasound-guided paracentesis yielding 4.7  liters of peritoneal fluid. Procedure performed by Alwyn Ren NP Electronically Signed   By: Richarda Overlie M.D.   On: 01/15/2023 10:52   US Abdomen Limited Result Date: 01/14/2023 CLINICAL DATA:  Evaluate for ascites EXAM: LIMITED ABDOMEN ULTRASOUND FOR ASCITES TECHNIQUE: Limited ultrasound survey for ascites was performed in all four abdominal quadrants. COMPARISON:  CT abdomen pelvis 12/24/2022 FINDINGS: Large volume ascites demonstrated throughout all 4 quadrants. IMPRESSION: Large volume ascites. Electronically Signed   By: Annia Belt M.D.   On: 01/14/2023 15:42    Microbiology: Results for orders placed or performed in visit on 08/02/21  Microscopic Examination     Status: Abnormal   Collection Time: 08/02/21 10:36 AM   Urine  Result Value Ref Range Status   WBC, UA 0-5 0 - 5 /hpf Final   RBC, Urine 3-10 (A) 0 - 2 /hpf Final   Epithelial Cells (non renal) 0-10 0 - 10 /hpf Final   Renal Epithel, UA None seen None seen /hpf Final   Mucus, UA Present Not Estab. Final   Bacteria, UA Few None seen/Few Final   *Note: Due to a large number of results and/or encounters for the requested time period, some results have not been displayed. A complete set of results can be found in Results Review.    Labs: CBC: Recent  Labs  Lab 02/09/23 2137 02/10/23 0416 02/11/23 0433  WBC 9.7 9.5 8.0  NEUTROABS 6.5  --   --   HGB 9.3* 8.4* 9.0*  HCT 29.9* 27.1* 27.6*  MCV 115.9* 116.3* 113.1*  PLT 214 202 208   Basic Metabolic Panel: Recent Labs  Lab 02/09/23 2137 02/10/23 0416 02/11/23 0433 02/12/23 0435  NA 134* 134* 135 134*  K 4.0 4.1 3.5 3.8  CL 94* 96* 98 98  CO2 26 28 28 26   GLUCOSE 89 108* 66* 77  BUN 12 10 9 10   CREATININE 0.79 0.73 0.66 0.76  CALCIUM 7.4* 7.3* 7.4* 7.6*  MG  --  1.1* 2.7*  --   PHOS  --  2.9  --  2.7   Liver Function Tests: Recent Labs  Lab 02/09/23 2137 02/10/23 0416 02/11/23 0433 02/12/23 0435  AST 65* 63* 48*  --   ALT 49* 47* 43  --    ALKPHOS 536* 526* 549*  --   BILITOT 1.8* 1.5* 1.5*  --   PROT 5.1* 5.0* 5.2*  --   ALBUMIN 1.9* 1.9* 2.0* 1.9*   CBG: Recent Labs  Lab 02/11/23 0636  GLUCAP 85    Discharge time spent: greater than 30 minutes.  Signed: Vassie Loll, MD [facility] 02/12/2023

## 2023-02-12 NOTE — Progress Notes (Signed)
RN called due to patient complaining of abdominal pain.  Dilaudid and Protonix were given without any relief and patient continued to moan in pain.  CT abdomen and pelvis with contrast was ordered and pending. Please follow-up!

## 2023-02-15 ENCOUNTER — Encounter: Payer: Self-pay | Admitting: Hematology

## 2023-02-17 ENCOUNTER — Other Ambulatory Visit: Payer: Self-pay | Admitting: Hematology

## 2023-02-17 ENCOUNTER — Telehealth: Payer: Self-pay | Admitting: Dietician

## 2023-02-17 ENCOUNTER — Inpatient Hospital Stay: Payer: 59 | Admitting: Dietician

## 2023-02-17 NOTE — Telephone Encounter (Signed)
Patient identified on MST (malnutrition screening tool) report for weight loss.  Attempted to contact patient via telephone for nutrition assessment. Patient did not answer. Left VM with request for return call. Contact information provided.

## 2023-02-17 NOTE — Progress Notes (Signed)
 53  [facility] [address] [place] [address]    Clinic Day:  02/18/23   Referring physician: Rosamond Leta NOVAK, MD  Patient Care Team: Rosamond Leta NOVAK, MD as PCP - General (Internal Medicine) Rogers Hai, MD as Medical Oncologist (Oncology)   ASSESSMENT & PLAN:   Assessment: 1.  Clinical stage IVa high-grade serous ovarian cancer, positive cytology of left pleural effusion: -4 cycles of carboplatin  and paclitaxel  from 08/24/2018 through 12/01/2018. -Robotic assisted laparoscopic TAH and BSO and omentectomy on 12/24/2018, pathology showing high-grade serous carcinoma, PT3P NX. -Germline mutation testing was negative. -3 more cycles of adjuvant chemotherapy completed on 03/15/2019. -CTAP on 04/13/2019 showed no findings of active malignancy.  28% reduction in the volume of presumed chronic hematoma/chronic fluid collection splaying the upper margin of the spleen.  Large type III hiatal hernia. -CTAP on 10/13/2019 shows no findings of recurrence or metastatic disease. - Foundation 1 shows HRD+, LOH score>16%.  MSI-stable.  MYC amplification.  T p53 mutation. - We reviewed CT CAP from 02/21/2021 which showed progressive peritoneal metastasis compared to 12/06/2020 scan.  No bowel obstruction.  Small right pleural effusion with mild enlargement of an isolated left external iliac lymph node. - Reviewed EGD from 02/28/2021 which showed hiatal hernia and normal findings. - She reported epigastric and left upper quadrant pain worse in the last 1 week which is related to progression of her malignancy. - 6 cycles of carboplatin  and paclitaxel  from 03/13/2021 through 06/18/2021 - CT CAP (07/12/2021): No evidence of recurrence or metastatic disease. - Olaparib  300 mg twice daily from 08/22/2021 through 11/28/2022 with progression - Cycle 1 carboplatin , gemcitabine  and bevacizumab  on 01/07/2023    Plan: 1.  Clinical stage IVa high-grade serous ovarian cancer, positive cytology of  left pleural effusion, HRD positive: - She received cycle 1 of carboplatin , gemcitabine  and bevacizumab  on 01/07/2023 followed by day 8 treatment on 01/17/2023. - She was recently hospitalized with partial small bowel obstruction which improved with conservative measures. - Overall her clinical condition has slightly improved after first cycle of treatment.  We have discussed continuation of treatment versus best supportive care in the form of hospice.  Patient is requesting to continue treatments.  She has not vomited since discharge from the hospital. - Reviewed labs today: AST and ALT are mildly elevated at 65 and 46 respectively.  Total bilirubin is 1.4.  Creatinine is normal.  CBC is grossly normal with leukocytosis, likely reactive. - She may proceed with cycle 2-day 1 today.  Will obtain CA125 level today.   2.  Lower back/left upper quadrant pain: - Continue Dilaudid  8 mg tablet once in the morning and evening and half tablet in between as needed.  She is off of OxyContin .   3.  Numbness in the feet/cramping in the hands: - She is not taking gabapentin  at this time.  4.  Iron deficiency state: - Last Feraheme  was in June 2024.  Hemoglobin today is 10.8.  5.  Constipation: - She is not taking stool softeners at this time and has about 4-5 bowel movements per day, soft.  6.  Hypomagnesemia: - Magnesium  is normal today.  Continue magnesium  twice daily.  Addendum: Patient has received bevacizumab  followed by gemcitabine  which completed at 12:30 PM.  At 12:43 PM, carboplatin  was started.  7 minutes after infusion, she developed difficulty breathing and redness of the face.  She was given 125 mg of Solu-Medrol  and 25 mg of Benadryl .  Her breathing has improved  and her vitals returned to normal.  Likely reaction to carboplatin .  She will come back next week to receive day 8 of gemcitabine .  We have to look into alternatives for her cycle 3.    Orders Placed This Encounter  Procedures   DG  CV Line Injection    Standing Status:   Future    Expected Date:   02/25/2023    Expiration Date:   02/18/2024    Reason for Exam (SYMPTOM  OR DIAGNOSIS REQUIRED):   port a cath in place; no blood return    Preferred Imaging Location?:   [facility]    Is the patient pregnant?:   No   Total Protein, Urine dipstick    Standing Status:   Future    Expected Date:   03/11/2023    Expiration Date:   03/10/2024   Magnesium     Standing Status:   Future    Expected Date:   03/11/2023    Expiration Date:   03/10/2024   CBC with Differential    Standing Status:   Future    Expected Date:   03/11/2023    Expiration Date:   03/10/2024   Comprehensive metabolic panel    Standing Status:   Future    Expected Date:   03/11/2023    Expiration Date:   03/10/2024   Magnesium     Standing Status:   Future    Expected Date:   03/18/2023    Expiration Date:   03/17/2024   CBC with Differential    Standing Status:   Future    Expected Date:   03/18/2023    Expiration Date:   03/17/2024   Comprehensive metabolic panel    Standing Status:   Future    Expected Date:   03/18/2023    Expiration Date:   03/17/2024   CA 125    Standing Status:   Future    Expected Date:   03/11/2023    Expiration Date:   03/10/2024      Michelle Holt,acting as a scribe for Michelle Stands, MD.,have documented all relevant documentation on the behalf of Michelle Stands, MD,as directed by  Michelle Stands, MD while in the presence of Michelle Stands, MD.  I, Michelle Stands MD, have reviewed the above documentation for accuracy and completeness, and I agree with the above.      Michelle Stands, MD   2/4/20254:49 PM  CHIEF COMPLAINT:   Diagnosis: high-grade serous ovarian cancer    Cancer Staging  No matching staging information was found for the patient.    Prior Therapy: 1. Carboplatin  and paclitaxel  x 7 cycles from 08/24/2018 to 03/15/2019. 2. Laparoscopic TAH & BSO & omenectomy on  12/24/2018. 3.  6 cycles of carboplatin  and paclitaxel  from 03/05/2021 to 06/18/2021  Current Therapy:  olaparib     HISTORY OF PRESENT ILLNESS:   Oncology History  Ovarian cancer, bilateral (HCC)  07/07/2018 Pathology Results   PLEURAL FLUID, LEFT (SPECIMEN 1 OF 1, COLLECTED 07/07/18): - MALIGNANT CELLS CONSISTENT WITH ADENOCARCINOMA - SEE COMMENT  Source Pleural Fluid, (Specimen 1 of 1, collected on 07/07/2018) Gross Specimen: Received is/are 1000cc of bloody red fluid with tissue. (TC:tc) Prepared: # Smears: 0 # Concentration Technique Slides (i.e. ThinPrep): 1 # Cell Block: 1 Conventional Additional Studies: Two Hematology slides labeled [id number] Comment The malignant cells are positive for cytokeratin 7, p53, WT-1, Pax-8, Moc31, ER (weak) and EMA but negative for cytokeratin 20, TTF-1, GATA-3, CDX-2 and D2-40. Overall, the  phenotype is consistent with a gynecologic primary; clinical correlation recommended.   07/07/2018 Procedure   Successful ultrasound guided left thoracentesis yielding 2.0 L of pleural fluid   07/08/2018 Procedure   1. Technically successful placement of left 14 French pigtail chest drain, placed to Pleur-evac water -seal.   07/08/2018 Procedure   1. Technically successful five French double lumen power injectable PICC placement   07/09/2018 Imaging   Ct chest 1. There is a moderate, loculated left hydropneumothorax with a small air component and moderate fluid component. The largest loculated component is located posteriorly. There is a pigtail drainage catheter about the lateral pleural space. There is no obvious etiology, such as obvious mass or pleural disease.   2. There is a small right pleural effusion with associated atelectasis or consolidation and a subpleural consolidation of the superior segment right lower lobe (series 4, image 56), of uncertain significance, possibly infectious or inflammatory   07/10/2018 Imaging   Ct abdomen and pelvis: 1. The  bilateral ovaries are enlarged by heterogeneous appearing cystic lesions, measuring 5.3 x 4.2 cm on the right (series 4, image 72) and 4.5 x 3.2 cm on the left (series 4, image 75). Consider dedicated pelvic ultrasound and/or pelvic MRI to further evaluate for solid components given high suspicion for GYN primary malignancy.   2. No other evidence of mass and no lymphadenopathy in the abdomen or pelvis.   3. Trace ascites. There is some suggestion of omental and peritoneal nodularity (e.g. Series 4, image 43), concerning for peritoneal metastatic disease.    4. Loculated left-sided pleural effusion with left-sided pleural drainage catheter in position. Small right pleural effusion   07/13/2018 Surgery   OPERATION: 1.  Left VATS (video-assisted thoracoscopic surgery) for drainage of loculated pleural effusion. 2.  Talc  pleurodesis for malignant pleural effusion. 3.  Placement of PleurX catheter for management of malignant pleural effusion. 4.  Placement of On-Q analgesia catheter system.    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   07/13/2018 Pathology Results   Pleura, peel, Left Pleural - FIBRO-FIBRINOUS PLEURITIS - NEGATIVE FOR MALIGNANCY   07/18/2018 Initial Diagnosis   Ovarian cancer, bilateral (HCC)   07/20/2018 Procedure   EGD impression: Normal proximal esophagus and mid esophagus. Mild distal esophageal rings; dilation not performed because of esophagitis. LA Grade C reflux esophagitis. Z-line regular, 30 cm from the incisors. 5 cm hiatal hernia. Non-bleeding gastric ulcer with no stigmata of bleeding. Gastritis. Duodenal erosions without bleeding. Normal second portion of the duodenum. No specimens collected.   08/19/2018 Genetic Testing   Negative genetic testing on the common  hereditary cancer panel.  The Common Hereditary Gene Panel offered by [facility] includes sequencing and/or deletion duplication testing of the following 48 genes: APC, ATM, AXIN2, BARD1, BMPR1A, BRCA1, BRCA2, BRIP1, CDH1, CDK4, CDKN2A (p14ARF), CDKN2A (p16INK4a), CHEK2, CTNNA1, DICER1, EPCAM (Deletion/duplication testing only), GREM1 (promoter region deletion/duplication testing only), KIT, MEN1, MLH1, MSH2, MSH3, MSH6, MUTYH, NBN, NF1, NHTL1, PALB2, PDGFRA, PMS2, POLD1, POLE, PTEN, RAD50, RAD51C, RAD51D, RNF43, SDHB, SDHC, SDHD, SMAD4, SMARCA4. STK11, TP53, TSC1, TSC2, and VHL.  The following genes were evaluated for sequence changes only: SDHA and HOXB13 c.251G>A variant only. The report date is August 19, 2018.   08/24/2018 - 03/15/2019 Chemotherapy   Patient is on Treatment Plan : OVARIAN Carboplatin  (AUC 6) / Paclitaxel  (175) q21d x 6 cycles  03/05/2021 - 06/18/2021 Chemotherapy   Patient is on Treatment Plan : OVARIAN Carboplatin  (AUC 6) / Paclitaxel  (175) q21d x 6 cycles     01/07/2023 -  Chemotherapy   Patient is on Treatment Plan : OVARIAN RECURRENT 3RD LINE Carboplatin  D1 + Gemcitabine  D1,8 (4/800) q21d        INTERVAL HISTORY:   Michelle Holt is a 53 y.o. female presenting to clinic today for follow up of high-grade serous ovarian cancer. She was last seen by me on 01/14/23.  Since her last visit, she was admitted to the hospital form 02/10/23 to 02/12/23 for SBO. She was given dilaudid , Zofran , and IV hydration. Palliative care was consulted. She was also admitted on 01/14/23 for malignant ascites s/p paracentesis.   Her appetite level is at 75%. Her energy level is at 0%. She is accompanied by her a family member.   She reports painful bilateral leg swelling. She notes a normal appetite, though she has abdominal cramps and immediately has a BM. She has BM's 4-5 times a day with soft stools. She is not taking any stool softener. She is tolerating some solids and mostly soft foods. She feels as if  there is something stuck in her throat. She denies any vomiting since discharge at hospital or fever.   She states her hair is beginning to fall out. She reports patches of dry skin on the arms and right breast that began during her hospital admission. She denies any itchiness of the dry patches. She notes occasional numbness in the hands and feet in the morning, though she is not taking gabapentin . She is also not taking Magnesium . She is taking 1 pill of Dilaudid  in the morning and in the evening for pain. She feels slightly better since starting her first chemotherapy, though her energy levels are so low she is unable to do day-to-day activities without assistance. She denies any bleeding, including rectal bleeding and hematochezia.   She notes pain at her port site since discharge at hospital.  PAST MEDICAL HISTORY:   Past Medical History: Past Medical History:  Diagnosis Date   Anemia    Anxiety and depression    Arthritis of facet joints at multiple vertebral levels    L5-S1   Constipation    Dyslipidemia    Family history of breast cancer    Family history of uterine cancer    GERD (gastroesophageal reflux disease)    History of hiatal hernia    History of kidney stones    Insomnia    Irritable bowel syndrome    Migraine    Muscle tension headache    Neuropathy of finger    Ovarian carcinoma (HCC)    ovarian   Plantar fasciitis of right foot    Port-A-Cath in place 08/20/2018    Surgical History: Past Surgical History:  Procedure Laterality Date   ABDOMINAL HYSTERECTOMY     BIOPSY  10/27/2020   Procedure: BIOPSY;  Surgeon: Eartha Angelia Sieving, MD;  [facility];  Service: Gastroenterology;;   CHOLECYSTECTOMY  2008   COLONOSCOPY N/A 08/13/2013   Procedure: COLONOSCOPY;  Surgeon: Claudis RAYMOND Rivet, MD;  [facility]: [facility];  Service: Endoscopy;  Laterality: N/A;  230-moved to 145 Ann to notify pt   COLONOSCOPY WITH PROPOFOL  N/A 11/15/2020   Procedure:  COLONOSCOPY WITH PROPOFOL ;  Surgeon: Rivet Claudis RAYMOND, MD;  [facility]: [facility];  Service: Endoscopy;  Laterality: N/A;  1:40   ESOPHAGEAL DILATION N/A 02/28/2021   Procedure: ESOPHAGEAL  DILATION;  Surgeon: Golda Claudis PENNER, MD;  [facility]: [facility];  Service: Endoscopy;  Laterality: N/A;   ESOPHAGOGASTRODUODENOSCOPY     ESOPHAGOGASTRODUODENOSCOPY (EGD) WITH PROPOFOL  N/A 07/20/2018   Procedure: ESOPHAGOGASTRODUODENOSCOPY (EGD) WITH PROPOFOL ;  Surgeon: Golda Claudis PENNER, MD;  [facility];  Service: Endoscopy;  Laterality: N/A;  Possible esophageal dilation.   ESOPHAGOGASTRODUODENOSCOPY (EGD) WITH PROPOFOL  N/A 10/27/2020   Procedure: ESOPHAGOGASTRODUODENOSCOPY (EGD) WITH PROPOFOL ;  Surgeon: Eartha Angelia Sieving, MD;  [facility];  Service: Gastroenterology;  Laterality: N/A;  2:10, pt knows to arrive at 10:15   ESOPHAGOGASTRODUODENOSCOPY (EGD) WITH PROPOFOL  N/A 02/28/2021   Procedure: ESOPHAGOGASTRODUODENOSCOPY (EGD) WITH PROPOFOL ;  Surgeon: Golda Claudis PENNER, MD;  [facility];  Service: Endoscopy;  Laterality: N/A;  200 ASA 1   ESOPHAGOGASTRODUODENOSCOPY (EGD) WITH PROPOFOL  N/A 03/29/2022   Procedure: ESOPHAGOGASTRODUODENOSCOPY (EGD) WITH PROPOFOL ;  Surgeon: Eartha Angelia Sieving, MD;  [facility];  Service: Gastroenterology;  Laterality: N/A;  2:00 pm, asa 1-2   GIVENS CAPSULE STUDY N/A 11/24/2020   Procedure: GIVENS CAPSULE STUDY;  Surgeon: Golda Claudis PENNER, MD;  [facility];  Service: Endoscopy;  Laterality: N/A;  7:30   IR ANGIOGRAM SELECTIVE EACH ADDITIONAL VESSEL  08/01/2018   IR ANGIOGRAM VISCERAL SELECTIVE  08/01/2018   IR EMBO ART  VEN HEMORR LYMPH EXTRAV  INC GUIDE ROADMAPPING  08/01/2018   IR IMAGING GUIDED PORT INSERTION  08/20/2018   IR PERC PLEURAL DRAIN W/INDWELL CATH W/IMG GUIDE  07/08/2018   IR THORACENTESIS ASP PLEURAL SPACE W/IMG GUIDE  07/07/2018   IR US  GUIDE VASC ACCESS RIGHT  08/01/2018   PLEURAL EFFUSION  DRAINAGE Left 07/13/2018   Procedure: DRAINAGE OF LOCULATED PLEURAL EFFUSION;  Surgeon: Fleeta Hanford Coy, MD;  Location: [facility] OR;  Service: Thoracic;  Laterality: Left;   POLYPECTOMY  11/15/2020   Procedure: POLYPECTOMY;  Surgeon: Golda Claudis PENNER, MD;  [facility];  Service: Endoscopy;;   REMOVAL OF PLEURAL DRAINAGE CATHETER Left 08/20/2018   Procedure: REMOVAL OF PLEURAL DRAINAGE CATHETER;  Surgeon: Fleeta Hanford Coy, MD;  Location: [facility] OR;  Service: Thoracic;  Laterality: Left;   REMOVAL OF PLEURAL DRAINAGE CATHETER Left 08/20/2018   Procedure: REMOVAL OF PLEURAL DRAINAGE CATHETER;  Surgeon: Fleeta Hanford Coy, MD;  Location: [facility] OR;  Service: Thoracic;  Laterality: Left;   TALC  PLEURODESIS Left 07/13/2018   Procedure: Talc  Pleuradesis;  Surgeon: Fleeta Hanford, Coy, MD;  Location: [facility] OR;  Service: Thoracic;  Laterality: Left;   TOTAL HIP ARTHROPLASTY Left 06/05/2020   Procedure: LEFT TOTAL HIP ARTHROPLASTY ANTERIOR APPROACH;  Surgeon: Jerri Kay HERO, MD;  Location: [facility] OR;  Service: Orthopedics;  Laterality: Left;  3-C   TUBAL LIGATION Bilateral    UTERINE ABLATION     VIDEO ASSISTED THORACOSCOPY Left 07/13/2018   Procedure: VIDEO ASSISTED THORACOSCOPY;  Surgeon: Fleeta Hanford Coy, MD;  Location: [facility] OR;  Service: Thoracic;  Laterality: Left;    Social History: Social History   Socioeconomic History   Marital status: Widowed    Spouse name: Not on file   Number of children: 2   Years of education: 2-College   Highest education level: Not on file  Occupational History    Employer: [employer]  Tobacco Use   Smoking status: Every Day    Current packs/day: 0.50    Average packs/day: 0.5 packs/day for 17.2 years (8.6 ttl pk-yrs)    Types: Cigarettes    Start date: 06/21/2001    Last attempt to  quit: 06/22/2018   Smokeless tobacco: Never  Vaping Use   Vaping status: Never Used  Substance and Sexual Activity   Alcohol use: Not Currently    Alcohol/week: 1.0 standard drink of alcohol    Types: 1  Glasses of wine per week    Comment: occasionally   Drug use: No   Sexual activity: Not Currently  Other Topics Concern   Not on file  Social History Narrative   Patient lives at home with her daughter.    Patient has 2 children.    Patient is widowed.    Patient is right handed.    Patient has her Associates degree.      Social Drivers of Corporate Investment Banker Strain: Low Risk  (08/26/2022)   Received from [facility]   Overall Financial Resource Strain (CARDIA)    Difficulty of Paying Living Expenses: Not hard at all  Food Insecurity: No Food Insecurity (02/10/2023)   Hunger Vital Sign    Worried About Running Out of Food in the Last Year: Never true    Ran Out of Food in the Last Year: Never true  Transportation Needs: No Transportation Needs (02/10/2023)   PRAPARE - Administrator, Civil Service (Medical): No    Lack of Transportation (Non-Medical): No  Physical Activity: Inactive (07/22/2018)   Exercise Vital Sign    Days of Exercise per Week: 0 days    Minutes of Exercise per Session: 0 min  Stress: Stress Concern Present (07/22/2018)   [employer] of Occupational Health - Occupational Stress Questionnaire    Feeling of Stress : Very much  Social Connections: Moderately Isolated (07/22/2018)   Social Connection and Isolation Panel [NHANES]    Frequency of Communication with Friends and Family: More than three times a week    Frequency of Social Gatherings with Friends and Family: More than three times a week    Attends Religious Services: 1 to 4 times per year    Active Member of [employer] or Organizations: No    Attends Banker Meetings: Never    Marital Status: Widowed  Intimate Partner Violence: Not At Risk (02/10/2023)   Humiliation, Afraid, Rape, and Kick questionnaire    Fear of Current or Ex-Partner: No    Emotionally Abused: No    Physically Abused: No    Sexually Abused: No    Family History: Family History   Problem Relation Age of Onset   Depression Mother    Hypertension Mother    Obesity Mother    Diabetes Mother    Kidney disease Mother    Peripheral vascular disease Father    Atrial fibrillation Father    Crohn's disease Sister    Uterine cancer Sister 25       maternal half sister   COPD Brother    Osteoporosis Brother    Breast cancer Maternal Aunt 50   Colon cancer Neg Hx     Current Medications:  Current Outpatient Medications:    albuterol  (VENTOLIN  HFA) 108 (90 Base) MCG/ACT inhaler, Inhale 2 puffs into the lungs every 6 (six) hours as needed for wheezing or shortness of breath., Disp: , Rfl:    diazepam  (VALIUM ) 5 MG tablet, Take 1 tablet (5 mg total) by mouth at bedtime., Disp: 30 tablet, Rfl: 5   dicyclomine  (BENTYL ) 20 MG tablet, Take 1 tablet (20 mg total) by mouth every 6 (six) hours as needed for spasms., Disp: 120 tablet, Rfl: 1  feeding supplement (ENSURE ENLIVE / ENSURE PLUS) LIQD, Take 237 mLs by mouth 3 (three) times daily between meals., Disp: , Rfl:    HYDROmorphone  (DILAUDID ) 8 MG tablet, Take 1 tablet (8 mg total) by mouth every 4 (four) hours as needed (Breakthrough Pain)., Disp: 180 tablet, Rfl: 0   LORazepam  (ATIVAN ) 0.5 MG tablet, Take 0.5 mg by mouth 3 (three) times daily as needed., Disp: , Rfl:    magic mouthwash w/lidocaine  SOLN, Take 5 mLs by mouth 4 (four) times daily as needed for mouth pain. Suspension contains equal amounts of Maalox Extra Strength, nystatin, diphenhydramine  and lidocaine ., Disp: 100 mL, Rfl: 1   metoCLOPramide  (REGLAN ) 10 MG tablet, Take 1 tablet (10 mg total) by mouth every 8 (eight) hours as needed for refractory nausea / vomiting., Disp: 45 tablet, Rfl: 0   naloxegol  oxalate (MOVANTIK ) 25 MG TABS tablet, Take 1 tablet (25 mg total) by mouth daily., Disp: 30 tablet, Rfl: 0   ondansetron  (ZOFRAN ) 4 MG tablet, TAKE 1 TABLET BY MOUTH EVERY 8 HOURS AS NEEDED FOR NAUSEA AND VOMITING, Disp: 40 tablet, Rfl: 3   ondansetron  (ZOFRAN -ODT)  4 MG disintegrating tablet, Take 1 tablet (4 mg total) by mouth every 8 (eight) hours as needed for nausea or vomiting., Disp: 90 tablet, Rfl: 2   oxyCODONE  (OXYCONTIN ) 60 MG 12 hr tablet, Take 1 tablet by mouth every 12 (twelve) hours as needed., Disp: 60 tablet, Rfl: 0   pantoprazole  (PROTONIX ) 40 MG tablet, Take 1 tablet (40 mg total) by mouth daily., Disp: 30 tablet, Rfl: 1 No current facility-administered medications for this visit.  Facility-Administered Medications Ordered in Other Visits:    0.9 %  sodium chloride  infusion, , Intravenous, Continuous, Rogers Hai, MD, Stopped at 10/23/20 1547   0.9 %  sodium chloride  infusion, , Intravenous, Continuous, Rogers Hai, MD, Stopped at 02/18/23 1351   sodium chloride  flush (NS) 0.9 % injection 10 mL, 10 mL, Intravenous, PRN, Litha Lamartina, MD, 10 mL at 10/23/20 1544   sodium chloride  flush (NS) 0.9 % injection 10 mL, 10 mL, Intracatheter, PRN, Archimedes Harold, MD, 10 mL at 02/18/23 1355   Allergies: Allergies  Allergen Reactions   Carboplatin  Shortness Of Breath    SOB and flushing of the skin.   Morphine  And Codeine Itching   Nickel Itching   Nortriptyline  Other (See Comments)    Significant weight gain   Topamax  [Topiramate ] Diarrhea and Nausea Only   Xanax  [Alprazolam ] Other (See Comments)    Can't wake up   Actifed Cold-Allergy [Chlorpheniramine-Phenyleph Er] Rash and Other (See Comments)    Red dye only   Amoxicillin Rash   Codeine Hives   Erythromycin Rash   Gemzar  [Gemcitabine ] Other (See Comments)    Feeling flushed, red in the face   Penicillins Rash        Red Dye Rash   Sudafed [Pseudoephedrine Hcl] Rash and Other (See Comments)    Red dye only    REVIEW OF SYSTEMS:   Review of Systems  Constitutional:  Negative for chills, fatigue and fever.  HENT:   Negative for lump/mass, mouth sores, nosebleeds, sore throat and trouble swallowing.   Eyes:  Negative for eye problems.   Respiratory:  Negative for cough and shortness of breath.   Cardiovascular:  Negative for chest pain, leg swelling and palpitations.  Gastrointestinal:  Positive for abdominal pain (7/10 severity), diarrhea, nausea and vomiting. Negative for constipation.  Genitourinary:  Negative for bladder incontinence, difficulty urinating, dysuria, frequency, hematuria and  nocturia.   Musculoskeletal:  Negative for arthralgias, back pain, flank pain, myalgias and neck pain.  Skin:  Negative for itching and rash.  Neurological:  Negative for dizziness, headaches and numbness.  Hematological:  Does not bruise/bleed easily.  Psychiatric/Behavioral:  Negative for depression, sleep disturbance and suicidal ideas. The patient is not nervous/anxious.   All other systems reviewed and are negative.    VITALS:   There were no vitals taken for this visit.  Wt Readings from Last 3 Encounters:  02/18/23 172 lb 1.6 oz (78.1 kg)  02/10/23 168 lb 3.4 oz (76.3 kg)  01/17/23 184 lb 12.8 oz (83.8 kg)    There is no height or weight on file to calculate BMI.  Performance status (ECOG): 1 - Symptomatic but completely ambulatory  PHYSICAL EXAM:   Physical Exam Vitals and nursing note reviewed. Exam conducted with a chaperone present.  Constitutional:      Appearance: Normal appearance.  Cardiovascular:     Rate and Rhythm: Normal rate and regular rhythm.     Pulses: Normal pulses.     Heart sounds: Normal heart sounds.  Pulmonary:     Effort: Pulmonary effort is normal.     Breath sounds: Normal breath sounds.  Abdominal:     Palpations: Abdomen is soft. There is no hepatomegaly, splenomegaly or mass.     Tenderness: There is no abdominal tenderness.  Musculoskeletal:     Right lower leg: 2+ Edema (extending up to the thigh) present.     Left lower leg: 2+ Edema (extending up to the thigh) present.  Lymphadenopathy:     Cervical: No cervical adenopathy.     Right cervical: No superficial, deep or  posterior cervical adenopathy.    Left cervical: No superficial, deep or posterior cervical adenopathy.     Upper Body:     Right upper body: No supraclavicular or axillary adenopathy.     Left upper body: No supraclavicular or axillary adenopathy.  Neurological:     General: No focal deficit present.     Mental Status: She is alert and oriented to person, place, and time.  Psychiatric:        Mood and Affect: Mood normal.        Behavior: Behavior normal.     LABS:      Latest Ref Rng & Units 02/18/2023    9:01 AM 02/11/2023    4:33 AM 02/10/2023    4:16 AM  CBC  WBC 4.0 - 10.5 K/uL 12.0  8.0  9.5   Hemoglobin 12.0 - 15.0 g/dL 89.1  9.0  8.4   Hematocrit 36.0 - 46.0 % 33.9  27.6  27.1   Platelets 150 - 400 K/uL 218  208  202       Latest Ref Rng & Units 02/18/2023    9:01 AM 02/12/2023    4:35 AM 02/11/2023    4:33 AM  CMP  Glucose 70 - 99 mg/dL 81  77  66   BUN 6 - 20 mg/dL 15  10  9    Creatinine 0.44 - 1.00 mg/dL 9.21  9.23  9.33   Sodium 135 - 145 mmol/L 135  134  135   Potassium 3.5 - 5.1 mmol/L 4.3  3.8  3.5   Chloride 98 - 111 mmol/L 95  98  98   CO2 22 - 32 mmol/L 28  26  28    Calcium  8.9 - 10.3 mg/dL 8.3  7.6  7.4   Total Protein  6.5 - 8.1 g/dL 6.3   5.2   Total Bilirubin 0.0 - 1.2 mg/dL 1.4   1.5   Alkaline Phos 38 - 126 U/L 894   549   AST 15 - 41 U/L 65   48   ALT 0 - 44 U/L 46   43      No results found for: CEA1, CEA / No results found for: CEA1, CEA No results found for: PSA1 No results found for: CAN199 Lab Results  Component Value Date   CAN125 151.0 (H) 11/12/2022    No results found for: STEPHANY CARLOTA BENSON MARKEL EARLA JOANNIE DOC, MSPIKE, SPEI Lab Results  Component Value Date   TIBC 288 11/12/2022   TIBC 329 08/05/2022   TIBC 346 05/27/2022   FERRITIN 206 11/12/2022   FERRITIN 219 08/05/2022   FERRITIN 67 05/27/2022   IRONPCTSAT 17 11/12/2022   IRONPCTSAT 29 08/05/2022   IRONPCTSAT 14  05/27/2022   Lab Results  Component Value Date   LDH 277 (H) 07/18/2018   LDH 193 (H) 07/07/2018     STUDIES:   CT ABDOMEN PELVIS W CONTRAST Result Date: 02/12/2023 CLINICAL DATA:  Stage IV ovarian cancer. New onset abdominal pain * Tracking Code: BO * EXAM: CT ABDOMEN AND PELVIS WITH CONTRAST TECHNIQUE: Multidetector CT imaging of the abdomen and pelvis was performed using the standard protocol following bolus administration of intravenous contrast. RADIATION DOSE REDUCTION: This exam was performed according to the departmental dose-optimization program which includes automated exposure control, adjustment of the mA and/or kV according to patient size and/or use of iterative reconstruction technique. CONTRAST:  OMNIPAQUE  IOHEXOL  300 MG/ML  SOLN COMPARISON:  Ultrasound same day 02/12/2023. CT 02/09/2023 and older. FINDINGS: Lower chest: Lung bases are grossly clear.  No pleural effusion. Hepatobiliary: Extensive fatty liver infiltration. Previous cholecystectomy. Patent portal vein. Pancreas: Moderate atrophy of the pancreas. Spleen: Lobular spleen. Anterior to the spleen is a 7 cm complex lesion with cystic areas and potential calcification, unchanged from previous. Embolization coils near the splenic hilum. Adrenals/Urinary Tract: Adrenal glands are preserved. No enhancing renal mass or collecting system dilatation. Bladder is underdistended. Stomach/Bowel: Large bowel is normal course and caliber. There is some scattered stool. Surgical changes along the base of the cecum. Stomach is nondilated. Overall small bowel has areas of small bowel stool appearance diffusely. Some loops are mildly distended with diameter approaching 3.3 cm. Previously there were more air-fluid levels along the small bowel with more loops that are distended. Vascular/Lymphatic: Normal caliber aorta and IVC with scattered atherosclerotic calcifications. Scattered small nodes are again identified, unchanged from previous.  Reproductive: Status post hysterectomy. No adnexal masses. Other: Anasarca. There is scattered ascites identified with diffuse areas of peritoneal and wall thickening along the course of the ascites. The level is similar to previous in the thickening is likely related to peritoneal spread of disease as per provided history. There is specific nodular areas of tissue as well seen adjacent to the distal stomach anteriorly, lesser curve and towards the porta hepatis. Again as on previous. Slight wall thickening along several loops of bowel again seen which is likely as well spread of disease. Musculoskeletal: Degenerative changes of the spine and pelvis. Streak artifact related to the patient's left hip arthroplasty. IMPRESSION: Again note is made of ascites with extensive nodular wall thickening along the course of the ascites, perineum peritoneum and along the surface of bowel loops consistent with known metastatic disease of ovarian cancer, peritoneal carcinomatosis. The previous dilated loops  of small bowel are decreasing today in size. Less air-fluid levels but more small bowel stool appearance. No free air. Stable numerous other findings in the short time interval. Electronically Signed   By: Ranell Bring M.D.   On: 02/12/2023 13:08   US  ASCITES (ABDOMEN LIMITED) Result Date: 02/12/2023 CLINICAL DATA:  Ovarian CA, peritoneal carcinomatosis, concern for recurrent large volume of ascites, abdominal discomfort EXAM: LIMITED ABDOMEN ULTRASOUND FOR ASCITES TECHNIQUE: Limited ultrasound survey for ascites was performed in all four abdominal quadrants. COMPARISON:  02/12/2023 CT FINDINGS: Survey of the abdominal 4 quadrants performed. Only a small volume of scattered abdominopelvic ascites some of which is septated and complex. No large volume of ascites demonstrated by ultrasound. Therapeutic paracentesis not performed. IMPRESSION: Small volume of abdominopelvic ascites. Electronically Signed   By: CHRISTELLA.  Shick M.D.    On: 02/12/2023 10:34   CT ABDOMEN PELVIS W CONTRAST Result Date: 02/09/2023 CLINICAL DATA:  Abdominal pain, nausea, and vomiting x2 months. Patient has ovarian cancer with metastases. Ultrasound therapeutic paracentesis x3 this month, last on 01/22/2023 with 600 mL clear yellow fluid removed. EXAM: CT ABDOMEN AND PELVIS WITH CONTRAST TECHNIQUE: Multidetector CT imaging of the abdomen and pelvis was performed using the standard protocol following bolus administration of intravenous contrast. RADIATION DOSE REDUCTION: This exam was performed according to the departmental dose-optimization program which includes automated exposure control, adjustment of the mA and/or kV according to patient size and/or use of iterative reconstruction technique. CONTRAST:  100mL OMNIPAQUE  IOHEXOL  300 MG/ML  SOLN COMPARISON:  Numerous prior CTs. The 2 most recent are a CT abdomen pelvis with contrast 12/24/2022 and CTA chest, abdomen and pelvis from 12/15/2022. FINDINGS: Lower chest: An infusion catheter again noted terminating about the superior cavoatrial junction. The cardiac size is normal. There is a small hiatal hernia. Mild elevation right hemidiaphragm with lung bases clear. Hepatobiliary: Moderate to severe hepatic steatosis is increased in the interval. There is no mass enhancement. Gallbladder is absent as before with stable prominent common bile duct measuring 11 mm. Pancreas: Partially atrophic. No focal abnormality. No inflammation. Spleen: Stable 7 cm splenic cystic lesion with patchy calcifications. Lobulated appearance of the spleen. No splenomegaly. Embolization coils again noted at the splenic hilum. No new abnormality. Adrenals/Urinary Tract: Adrenal glands are unremarkable. Kidneys are normal, without renal calculi, focal lesion, or hydronephrosis. Bladder is unremarkable. Stomach/Bowel: Chronic thickened folds in the upper to mid stomach. Mild increase parent fluid distention. Increased dilatation in duodenum and  proximal to mid small bowel up to 4.4 cm caliber. There are thickened folds in multiple dilated and nondilated segments. There is a thickened segment in the central abdomen best seen on 2: 34-37 which is believed to be the transitional segment causing intermediate grade obstruction. Decompressed segments are present downstream. This is probably mid ileal in location. An appendix is not seen. There is mild fecal stasis ascending and transverse colon. Rest of the large bowel is largely contracted. Vascular/Lymphatic: Aortic atherosclerosis. No enlarged abdominal or pelvic lymph nodes. Reproductive: Status post hysterectomy. No adnexal masses. Multiple pelvic phleboliths. Other: Mild-to-moderate free fluid, less than on 12/24/2022. There is enhancement of the peritoneal reflections consistent with peritoneal carcinomatosis, ranging from linear to nodular enhancement as before. No overt progression in this is seen. There are minimal layering presacral ascites as well. There is no free hemorrhage or free air.  No incarcerated hernia. Musculoskeletal: Left hip replacement. Grade 1 degenerative anterolisthesis L4-5 with advanced facet hypertrophy and acquired spinal stenosis. Osteopenia.  No regional bone  metastasis is seen. IMPRESSION: 1. Intermediate grade small-bowel obstruction with transitional segment in the central abdomen, probably mid ileal in location, due to a thickened segment. No pneumatosis. 2. Mild-to-moderate ascites, less than on 12/24/2022. 3. Peritoneal carcinomatosis findings without overt progression. 4. Increased moderate to severe hepatic steatosis. 5. Stable 7 cm splenic cystic lesion with patchy calcifications. 6. Aortic atherosclerosis. 7. Small hiatal hernia. 8. Osteopenia and degenerative change. Aortic Atherosclerosis (ICD10-I70.0). Electronically Signed   By: Francis Quam M.D.   On: 02/09/2023 23:03   DG Chest Portable 1 View Result Date: 02/09/2023 CLINICAL DATA:  Shortness of breath  with abdominal pain. EXAM: PORTABLE CHEST 1 VIEW COMPARISON:  Chest x-ray 11/12/2021 FINDINGS: Right chest port catheter tip projects over the distal SVC. The heart size and mediastinal contours are within normal limits. Both lungs are clear. The visualized skeletal structures are unremarkable. IMPRESSION: No active disease. Electronically Signed   By: Greig Pique M.D.   On: 02/09/2023 21:58   US  Paracentesis Result Date: 01/22/2023 INDICATION: Patient with history of metastatic ovarian cancer, recurrent ascites. Request for therapeutic paracentesis. EXAM: ULTRASOUND GUIDED THERAPEUTIC PARACENTESIS MEDICATIONS: 10 mL 1% lidocaine  COMPLICATIONS: None immediate. PROCEDURE: Informed written consent was obtained from the patient after a discussion of the risks, benefits and alternatives to treatment. A timeout was performed prior to the initiation of the procedure. Initial ultrasound scanning demonstrates a small amount of loculated ascites within the right lower abdominal quadrant. The largest pocket was selected for drainage and the right lower abdomen was prepped and draped in the usual sterile fashion. 1% lidocaine  was used for local anesthesia. Following this, a 19 gauge, 7-cm, Yueh catheter was introduced. An ultrasound image was saved for documentation purposes. The paracentesis was performed. The catheter was removed and a dressing was applied. The patient tolerated the procedure well without immediate post procedural complication. Patient received post-procedure intravenous albumin ; see nursing notes for details. FINDINGS: A total of approximately 600 mL of clear yellow fluid was removed. IMPRESSION: Successful ultrasound-guided paracentesis yielding 600 milliliters of peritoneal fluid. Performed by Clotilda Hesselbach, PA-C Electronically Signed   By: Ester Sides M.D.   On: 01/22/2023 15:58

## 2023-02-18 ENCOUNTER — Inpatient Hospital Stay (HOSPITAL_BASED_OUTPATIENT_CLINIC_OR_DEPARTMENT_OTHER): Payer: 59 | Admitting: Hematology

## 2023-02-18 ENCOUNTER — Inpatient Hospital Stay: Payer: 59

## 2023-02-18 ENCOUNTER — Inpatient Hospital Stay: Payer: 59 | Attending: Hematology

## 2023-02-18 VITALS — BP 123/81 | HR 118 | Resp 22

## 2023-02-18 DIAGNOSIS — R252 Cramp and spasm: Secondary | ICD-10-CM | POA: Diagnosis not present

## 2023-02-18 DIAGNOSIS — Z90710 Acquired absence of both cervix and uterus: Secondary | ICD-10-CM | POA: Insufficient documentation

## 2023-02-18 DIAGNOSIS — K5900 Constipation, unspecified: Secondary | ICD-10-CM | POA: Diagnosis not present

## 2023-02-18 DIAGNOSIS — C563 Malignant neoplasm of bilateral ovaries: Secondary | ICD-10-CM | POA: Insufficient documentation

## 2023-02-18 DIAGNOSIS — Z95828 Presence of other vascular implants and grafts: Secondary | ICD-10-CM

## 2023-02-18 DIAGNOSIS — Z5111 Encounter for antineoplastic chemotherapy: Secondary | ICD-10-CM | POA: Insufficient documentation

## 2023-02-18 DIAGNOSIS — Z5112 Encounter for antineoplastic immunotherapy: Secondary | ICD-10-CM | POA: Insufficient documentation

## 2023-02-18 DIAGNOSIS — C786 Secondary malignant neoplasm of retroperitoneum and peritoneum: Secondary | ICD-10-CM | POA: Insufficient documentation

## 2023-02-18 DIAGNOSIS — Z79899 Other long term (current) drug therapy: Secondary | ICD-10-CM | POA: Diagnosis not present

## 2023-02-18 DIAGNOSIS — Z9079 Acquired absence of other genital organ(s): Secondary | ICD-10-CM | POA: Insufficient documentation

## 2023-02-18 DIAGNOSIS — R200 Anesthesia of skin: Secondary | ICD-10-CM | POA: Insufficient documentation

## 2023-02-18 DIAGNOSIS — Z90722 Acquired absence of ovaries, bilateral: Secondary | ICD-10-CM | POA: Diagnosis not present

## 2023-02-18 LAB — CBC WITH DIFFERENTIAL/PLATELET
Abs Immature Granulocytes: 0.12 10*3/uL — ABNORMAL HIGH (ref 0.00–0.07)
Basophils Absolute: 0.1 10*3/uL (ref 0.0–0.1)
Basophils Relative: 0 %
Eosinophils Absolute: 0.1 10*3/uL (ref 0.0–0.5)
Eosinophils Relative: 1 %
HCT: 33.9 % — ABNORMAL LOW (ref 36.0–46.0)
Hemoglobin: 10.8 g/dL — ABNORMAL LOW (ref 12.0–15.0)
Immature Granulocytes: 1 %
Lymphocytes Relative: 20 %
Lymphs Abs: 2.3 10*3/uL (ref 0.7–4.0)
MCH: 37.9 pg — ABNORMAL HIGH (ref 26.0–34.0)
MCHC: 31.9 g/dL (ref 30.0–36.0)
MCV: 118.9 fL — ABNORMAL HIGH (ref 80.0–100.0)
Monocytes Absolute: 1.3 10*3/uL — ABNORMAL HIGH (ref 0.1–1.0)
Monocytes Relative: 11 %
Neutro Abs: 8.1 10*3/uL — ABNORMAL HIGH (ref 1.7–7.7)
Neutrophils Relative %: 67 %
Platelets: 218 10*3/uL (ref 150–400)
RBC: 2.85 MIL/uL — ABNORMAL LOW (ref 3.87–5.11)
RDW: 25.2 % — ABNORMAL HIGH (ref 11.5–15.5)
Smear Review: ADEQUATE
WBC: 12 10*3/uL — ABNORMAL HIGH (ref 4.0–10.5)
nRBC: 0 % (ref 0.0–0.2)

## 2023-02-18 LAB — COMPREHENSIVE METABOLIC PANEL
ALT: 46 U/L — ABNORMAL HIGH (ref 0–44)
AST: 65 U/L — ABNORMAL HIGH (ref 15–41)
Albumin: 2.3 g/dL — ABNORMAL LOW (ref 3.5–5.0)
Alkaline Phosphatase: 894 U/L — ABNORMAL HIGH (ref 38–126)
Anion gap: 12 (ref 5–15)
BUN: 15 mg/dL (ref 6–20)
CO2: 28 mmol/L (ref 22–32)
Calcium: 8.3 mg/dL — ABNORMAL LOW (ref 8.9–10.3)
Chloride: 95 mmol/L — ABNORMAL LOW (ref 98–111)
Creatinine, Ser: 0.78 mg/dL (ref 0.44–1.00)
GFR, Estimated: >60 mL/min (ref >60)
Glucose, Bld: 81 mg/dL (ref 70–99)
Potassium: 4.3 mmol/L (ref 3.5–5.1)
Sodium: 135 mmol/L (ref 135–145)
Total Bilirubin: 1.4 mg/dL — ABNORMAL HIGH (ref 0.0–1.2)
Total Protein: 6.3 g/dL — ABNORMAL LOW (ref 6.5–8.1)

## 2023-02-18 LAB — SAMPLE TO BLOOD BANK

## 2023-02-18 LAB — MAGNESIUM: Magnesium: 1.3 mg/dL — ABNORMAL LOW (ref 1.7–2.4)

## 2023-02-18 MED ORDER — HEPARIN SOD (PORK) LOCK FLUSH 100 UNIT/ML IV SOLN
500.0000 [IU] | Freq: Once | INTRAVENOUS | Status: AC | PRN
  Administered 2023-02-18: 500 [IU]

## 2023-02-18 MED ORDER — ALTEPLASE 2 MG IJ SOLR
2.0000 mg | Freq: Once | INTRAMUSCULAR | Status: AC
  Administered 2023-02-18: 2 mg
  Filled 2023-02-18: qty 2

## 2023-02-18 MED ORDER — FAMOTIDINE IN NACL 20-0.9 MG/50ML-% IV SOLN
20.0000 mg | Freq: Once | INTRAVENOUS | Status: AC
Start: 2023-02-18 — End: 2023-02-18
  Administered 2023-02-18: 20 mg via INTRAVENOUS
  Filled 2023-02-18: qty 50

## 2023-02-18 MED ORDER — SODIUM CHLORIDE 0.9 % IV SOLN
520.0000 mg | Freq: Once | INTRAVENOUS | Status: AC
  Administered 2023-02-18: 520 mg via INTRAVENOUS
  Filled 2023-02-18: qty 52

## 2023-02-18 MED ORDER — SODIUM CHLORIDE FLUSH 0.9 % IV SOLN
10.0000 mL | Freq: Once | INTRAVENOUS | Status: AC
  Administered 2023-02-18: 10 mL via INTRAVENOUS
  Filled 2023-02-18: qty 10

## 2023-02-18 MED ORDER — SODIUM CHLORIDE 0.9 % IV SOLN: INTRAVENOUS | Status: DC

## 2023-02-18 MED ORDER — DEXAMETHASONE SODIUM PHOSPHATE 10 MG/ML IJ SOLN
10.0000 mg | Freq: Once | INTRAMUSCULAR | Status: AC
Start: 2023-02-18 — End: 2023-02-18
  Administered 2023-02-18: 10 mg via INTRAVENOUS
  Filled 2023-02-18: qty 1

## 2023-02-18 MED ORDER — CETIRIZINE HCL 10 MG/ML IV SOLN
10.0000 mg | Freq: Once | INTRAVENOUS | Status: AC
  Administered 2023-02-18: 10 mg via INTRAVENOUS
  Filled 2023-02-18: qty 1

## 2023-02-18 MED ORDER — SODIUM CHLORIDE 0.9 % IV SOLN
150.0000 mg | Freq: Once | INTRAVENOUS | Status: AC
  Administered 2023-02-18: 150 mg via INTRAVENOUS
  Filled 2023-02-18: qty 150

## 2023-02-18 MED ORDER — METHYLPREDNISOLONE SODIUM SUCC 125 MG IJ SOLR
125.0000 mg | Freq: Once | INTRAMUSCULAR | Status: AC | PRN
  Administered 2023-02-18: 125 mg via INTRAVENOUS

## 2023-02-18 MED ORDER — PALONOSETRON HCL INJECTION 0.25 MG/5ML
0.2500 mg | Freq: Once | INTRAVENOUS | Status: AC
  Administered 2023-02-18: 0.25 mg via INTRAVENOUS
  Filled 2023-02-18: qty 5

## 2023-02-18 MED ORDER — SODIUM CHLORIDE 0.9 % IV SOLN
600.0000 mg/m2 | Freq: Once | INTRAVENOUS | Status: AC
  Administered 2023-02-18: 1140 mg via INTRAVENOUS
  Filled 2023-02-18: qty 29.98

## 2023-02-18 MED ORDER — MAGNESIUM SULFATE 4 GM/100ML IV SOLN
4.0000 g | Freq: Once | INTRAVENOUS | Status: AC
  Administered 2023-02-18: 4 g via INTRAVENOUS
  Filled 2023-02-18: qty 100

## 2023-02-18 MED ORDER — SODIUM CHLORIDE 0.9% FLUSH
10.0000 mL | INTRAVENOUS | Status: DC | PRN
  Administered 2023-02-18: 10 mL

## 2023-02-18 MED ORDER — SODIUM CHLORIDE 0.9 % IV SOLN
15.0000 mg/kg | Freq: Once | INTRAVENOUS | Status: AC
  Administered 2023-02-18: 1200 mg via INTRAVENOUS
  Filled 2023-02-18: qty 48

## 2023-02-18 MED ORDER — DIPHENHYDRAMINE HCL 50 MG/ML IJ SOLN
50.0000 mg | Freq: Once | INTRAMUSCULAR | Status: AC | PRN
  Administered 2023-02-18: 25 mg via INTRAVENOUS

## 2023-02-18 NOTE — Patient Instructions (Signed)

## 2023-02-18 NOTE — Patient Instructions (Signed)
[facility] [place] - A DEPT OF [facility]  Discharge Instructions: Thank you for choosing [facility] to provide your oncology and hematology care.  If you have a lab appointment with the Cancer Center - please note that after April 8th, 2024, all labs will be drawn in the cancer center.  You do not have to check in or register with the main entrance as you have in the past but will complete your check-in in the cancer center.  Wear comfortable clothing and clothing appropriate for easy access to any Portacath or PICC line.   We strive to give you quality time with your provider. You may need to reschedule your appointment if you arrive late (15 or more minutes).  Arriving late affects you and other patients whose appointments are after yours.  Also, if you miss three or more appointments without notifying the office, you may be dismissed from the clinic at the provider's discretion.      For prescription refill requests, have your pharmacy contact our office and allow 72 hours for refills to be completed.    Today you received the following chemotherapy and/or immunotherapy agents MVASI/Gemzar/Carboplatin/Magnesium Sulfate      To help prevent nausea and vomiting after your treatment, we encourage you to take your nausea medication as directed.  BELOW ARE SYMPTOMS THAT SHOULD BE REPORTED IMMEDIATELY: *FEVER GREATER THAN 100.4 F (38 C) OR HIGHER *CHILLS OR SWEATING *NAUSEA AND VOMITING THAT IS NOT CONTROLLED WITH YOUR NAUSEA MEDICATION *UNUSUAL SHORTNESS OF BREATH *UNUSUAL BRUISING OR BLEEDING *URINARY PROBLEMS (pain or burning when urinating, or frequent urination) *BOWEL PROBLEMS (unusual diarrhea, constipation, pain near the anus) TENDERNESS IN MOUTH AND THROAT WITH OR WITHOUT PRESENCE OF ULCERS (sore throat, sores in mouth, or a toothache) UNUSUAL RASH, SWELLING OR PAIN  UNUSUAL VAGINAL DISCHARGE OR ITCHING   Items with * indicate a potential  emergency and should be followed up as soon as possible or go to the Emergency Department if any problems should occur.  Please show the CHEMOTHERAPY ALERT CARD or IMMUNOTHERAPY ALERT CARD at check-in to the Emergency Department and triage nurse.  Should you have questions after your visit or need to cancel or reschedule your appointment, please contact [facility] [place] - A DEPT OF [facility] Bridegroom [facility] [phone]  and follow the prompts.  Office hours are 8:00 a.m. to 4:30 p.m. Monday - Friday. Please note that voicemails left after 4:00 p.m. may not be returned until the following business day.  We are closed weekends and major holidays. You have access to a nurse at all times for urgent questions. Please call the main number to the clinic [phone] and follow the prompts.  For any non-urgent questions, you may also contact your provider using MyChart. We now offer e-Visits for anyone 10 and older to request care online for non-urgent symptoms. For details visit [email or web address].   Also download the MyChart app! Go to the app store, search "MyChart", open the app, select [place], and log in with your MyChart username and password.

## 2023-02-18 NOTE — Progress Notes (Signed)
Port flushes with no blood return noted, alteplase placed at 0914 per orders. Primary RN made aware.

## 2023-02-18 NOTE — Progress Notes (Signed)
 Patient presents today for MVASI /Gemzar /Carboplatin  per providers order.  Vital signs and labs reviewed by MD.  Message received from Isaiah Piety RN/Dr. Katragadda, patient okay for treatment.  Patient will also receive 4 grams IV magnesium  for a level of 1.3.  Hypersensitivity Reaction note  Date of event: 02/18/23 Time of event: 1250 Generic name of drug involved: Carboplatin  Name of provider notified of the hypersensitivity reaction: Katragadda Was agent that likely caused hypersensitivity reaction added to Allergies List within EMR? Carbopltain Chain of events including reaction signs/symptoms, treatment administered, and outcome (e.g., drug resumed; drug discontinued; sent to Emergency Department; etc.)  1250 pt called out , stated she cannot breath, pt was in bathroom at that time, family member came out to get a nurse. Carboplatin  and saline infusion stopped at this time. MD notified, 58 Dr. Katragadda at bedside, new saline infusion bolus administered. Vitals obtained. Non re breather mask administered. 1254 HR 128, Oxygen saturation is 81%, patient stating she cannot breath, 1254 Solumedrol given per orders, 1256, Benadryl  25 mg given per MD orders. 1256  oxygen sat. 98% on non re breather, 111/68, HR 115. Will monitor for now per MD.       Patient transitioned to nasal cannula and then to room air.  Per MD the rest of the Carboplatin  infusion was held.   Vital signs stable.  Instructed patient to go to the ED if she has chest pain,SOB,or any other life threatening symptoms.  Patient expressed understanding.  No questions at this time.  Discharge from clinic via wheelchair in stable condition.  Alert and oriented X 3.  Follow up with [facility] as scheduled.   Celena Creola Darner, RN 02/18/2023 3:39 PM

## 2023-02-18 NOTE — Progress Notes (Signed)
 1250 pt called out , stated she cannot breath, pt was in bathroom at that time, family member came out to get a nurse. Carboplatin  and saline infusion stopped at this time. MD notified, 2 Dr. Katragadda at bedside, new saline infusion bolus administered. Vitals obtained. Non re breather mask administered. 1254 HR 128, Oxygen saturation is 81%, patient stating she cannot breath, 1254 Solumedrol given per orders, 1256, Benadryl  25 mg given per MD orders. 1256  oxygen sat. 98% on non re breather, 111/68, HR 115. Will monitor for now per MD.

## 2023-02-19 ENCOUNTER — Inpatient Hospital Stay: Payer: 59

## 2023-02-19 ENCOUNTER — Telehealth: Payer: Self-pay | Admitting: *Deleted

## 2023-02-19 ENCOUNTER — Other Ambulatory Visit: Payer: Self-pay

## 2023-02-19 DIAGNOSIS — C7B04 Secondary carcinoid tumors of peritoneum: Secondary | ICD-10-CM | POA: Diagnosis not present

## 2023-02-19 DIAGNOSIS — C782 Secondary malignant neoplasm of pleura: Secondary | ICD-10-CM | POA: Diagnosis not present

## 2023-02-19 DIAGNOSIS — Z515 Encounter for palliative care: Secondary | ICD-10-CM | POA: Diagnosis not present

## 2023-02-19 LAB — CA 125: Cancer Antigen (CA) 125: 476 U/mL — ABNORMAL HIGH (ref 0.0–38.1)

## 2023-02-19 NOTE — Telephone Encounter (Signed)
 Patient reached out to confirm as to if [facility] was coming to the home today at 1:00.  Spoke to [place] with [facility], who confirmed appointment. Patient and niece Rosina made aware to e sign documents that were sent to them via e mail prior to visit.  Verbalized understanding.

## 2023-02-25 ENCOUNTER — Inpatient Hospital Stay: Payer: 59

## 2023-02-27 ENCOUNTER — Inpatient Hospital Stay: Payer: 59

## 2023-02-27 VITALS — BP 105/59 | HR 90 | Temp 96.7°F | Resp 18 | Wt 158.0 lb

## 2023-02-27 DIAGNOSIS — Z79899 Other long term (current) drug therapy: Secondary | ICD-10-CM | POA: Diagnosis not present

## 2023-02-27 DIAGNOSIS — Z5111 Encounter for antineoplastic chemotherapy: Secondary | ICD-10-CM | POA: Diagnosis not present

## 2023-02-27 DIAGNOSIS — K5900 Constipation, unspecified: Secondary | ICD-10-CM | POA: Diagnosis not present

## 2023-02-27 DIAGNOSIS — C563 Malignant neoplasm of bilateral ovaries: Secondary | ICD-10-CM

## 2023-02-27 DIAGNOSIS — R200 Anesthesia of skin: Secondary | ICD-10-CM | POA: Diagnosis not present

## 2023-02-27 DIAGNOSIS — C786 Secondary malignant neoplasm of retroperitoneum and peritoneum: Secondary | ICD-10-CM | POA: Diagnosis not present

## 2023-02-27 DIAGNOSIS — R252 Cramp and spasm: Secondary | ICD-10-CM | POA: Diagnosis not present

## 2023-02-27 DIAGNOSIS — Z95828 Presence of other vascular implants and grafts: Secondary | ICD-10-CM

## 2023-02-27 DIAGNOSIS — Z5112 Encounter for antineoplastic immunotherapy: Secondary | ICD-10-CM | POA: Diagnosis not present

## 2023-02-27 LAB — COMPREHENSIVE METABOLIC PANEL
ALT: 89 U/L — ABNORMAL HIGH (ref 0–44)
AST: 85 U/L — ABNORMAL HIGH (ref 15–41)
Albumin: 2.4 g/dL — ABNORMAL LOW (ref 3.5–5.0)
Alkaline Phosphatase: 744 U/L — ABNORMAL HIGH (ref 38–126)
Anion gap: 11 (ref 5–15)
BUN: 10 mg/dL (ref 6–20)
CO2: 27 mmol/L (ref 22–32)
Calcium: 8.7 mg/dL — ABNORMAL LOW (ref 8.9–10.3)
Chloride: 98 mmol/L (ref 98–111)
Creatinine, Ser: 0.69 mg/dL (ref 0.44–1.00)
GFR, Estimated: >60 mL/min (ref >60)
Glucose, Bld: 87 mg/dL (ref 70–99)
Potassium: 3.8 mmol/L (ref 3.5–5.1)
Sodium: 136 mmol/L (ref 135–145)
Total Bilirubin: 1 mg/dL (ref 0.0–1.2)
Total Protein: 6.7 g/dL (ref 6.5–8.1)

## 2023-02-27 LAB — CBC WITH DIFFERENTIAL/PLATELET
Abs Immature Granulocytes: 0.11 10*3/uL — ABNORMAL HIGH (ref 0.00–0.07)
Basophils Absolute: 0 10*3/uL (ref 0.0–0.1)
Basophils Relative: 0 %
Eosinophils Absolute: 0 10*3/uL (ref 0.0–0.5)
Eosinophils Relative: 0 %
HCT: 30.8 % — ABNORMAL LOW (ref 36.0–46.0)
Hemoglobin: 9.9 g/dL — ABNORMAL LOW (ref 12.0–15.0)
Immature Granulocytes: 2 %
Lymphocytes Relative: 35 %
Lymphs Abs: 2.4 10*3/uL (ref 0.7–4.0)
MCH: 39.1 pg — ABNORMAL HIGH (ref 26.0–34.0)
MCHC: 32.1 g/dL (ref 30.0–36.0)
MCV: 121.7 fL — ABNORMAL HIGH (ref 80.0–100.0)
Monocytes Absolute: 0.8 10*3/uL (ref 0.1–1.0)
Monocytes Relative: 12 %
Neutro Abs: 3.5 10*3/uL (ref 1.7–7.7)
Neutrophils Relative %: 51 %
Platelets: 86 10*3/uL — ABNORMAL LOW (ref 150–400)
RBC: 2.53 MIL/uL — ABNORMAL LOW (ref 3.87–5.11)
RDW: 21.2 % — ABNORMAL HIGH (ref 11.5–15.5)
Smear Review: DECREASED
WBC: 6.7 10*3/uL (ref 4.0–10.5)
nRBC: 3 % — ABNORMAL HIGH (ref 0.0–0.2)

## 2023-02-27 LAB — MAGNESIUM: Magnesium: 1.4 mg/dL — ABNORMAL LOW (ref 1.7–2.4)

## 2023-02-27 MED ORDER — FAMOTIDINE IN NACL 20-0.9 MG/50ML-% IV SOLN
20.0000 mg | Freq: Once | INTRAVENOUS | Status: AC
  Administered 2023-02-27: 20 mg via INTRAVENOUS
  Filled 2023-02-27: qty 50

## 2023-02-27 MED ORDER — MAGNESIUM SULFATE 4 GM/100ML IV SOLN
4.0000 g | Freq: Once | INTRAVENOUS | Status: AC
  Administered 2023-02-27: 4 g via INTRAVENOUS
  Filled 2023-02-27: qty 100

## 2023-02-27 MED ORDER — HEPARIN SOD (PORK) LOCK FLUSH 100 UNIT/ML IV SOLN
500.0000 [IU] | Freq: Once | INTRAVENOUS | Status: AC | PRN
Start: 2023-02-27 — End: 2023-02-27
  Administered 2023-02-27: 500 [IU]

## 2023-02-27 MED ORDER — CETIRIZINE HCL 10 MG/ML IV SOLN
10.0000 mg | Freq: Once | INTRAVENOUS | Status: AC
Start: 2023-02-27 — End: 2023-02-27
  Administered 2023-02-27: 10 mg via INTRAVENOUS
  Filled 2023-02-27: qty 1

## 2023-02-27 MED ORDER — SODIUM CHLORIDE 0.9 % IV SOLN: INTRAVENOUS | Status: DC

## 2023-02-27 MED ORDER — PROCHLORPERAZINE MALEATE 10 MG PO TABS
10.0000 mg | ORAL_TABLET | Freq: Once | ORAL | Status: AC
  Administered 2023-02-27: 10 mg via ORAL
  Filled 2023-02-27: qty 1

## 2023-02-27 MED ORDER — GEMCITABINE HCL CHEMO INJECTION 1 GM/26.3ML
600.0000 mg/m2 | Freq: Once | INTRAVENOUS | Status: AC
  Administered 2023-02-27: 1140 mg via INTRAVENOUS
  Filled 2023-02-27: qty 29.98

## 2023-02-27 NOTE — Patient Instructions (Signed)
[facility] [place] - A DEPT OF [facility]  Discharge Instructions: Thank you for choosing [facility] to provide your oncology and hematology care.  If you have a lab appointment with the Cancer Center - please note that after April 8th, 2024, all labs will be drawn in the cancer center.  You do not have to check in or register with the main entrance as you have in the past but will complete your check-in in the cancer center.  Wear comfortable clothing and clothing appropriate for easy access to any Portacath or PICC line.   We strive to give you quality time with your provider. You may need to reschedule your appointment if you arrive late (15 or more minutes).  Arriving late affects you and other patients whose appointments are after yours.  Also, if you miss three or more appointments without notifying the office, you may be dismissed from the clinic at the provider's discretion.      For prescription refill requests, have your pharmacy contact our office and allow 72 hours for refills to be completed.    Today you received the following chemotherapy and/or immunotherapy agents gemzar   To help prevent nausea and vomiting after your treatment, we encourage you to take your nausea medication as directed.  BELOW ARE SYMPTOMS THAT SHOULD BE REPORTED IMMEDIATELY: *FEVER GREATER THAN 100.4 F (38 C) OR HIGHER *CHILLS OR SWEATING *NAUSEA AND VOMITING THAT IS NOT CONTROLLED WITH YOUR NAUSEA MEDICATION *UNUSUAL SHORTNESS OF BREATH *UNUSUAL BRUISING OR BLEEDING *URINARY PROBLEMS (pain or burning when urinating, or frequent urination) *BOWEL PROBLEMS (unusual diarrhea, constipation, pain near the anus) TENDERNESS IN MOUTH AND THROAT WITH OR WITHOUT PRESENCE OF ULCERS (sore throat, sores in mouth, or a toothache) UNUSUAL RASH, SWELLING OR PAIN  UNUSUAL VAGINAL DISCHARGE OR ITCHING   Items with * indicate a potential emergency and should be followed up as  soon as possible or go to the Emergency Department if any problems should occur.  Please show the CHEMOTHERAPY ALERT CARD or IMMUNOTHERAPY ALERT CARD at check-in to the Emergency Department and triage nurse.  Should you have questions after your visit or need to cancel or reschedule your appointment, please contact [facility] [place] - A DEPT OF [facility] [phone]  and follow the prompts.  Office hours are 8:00 a.m. to 4:30 p.m. Monday - Friday. Please note that voicemails left after 4:00 p.m. may not be returned until the following business day.  We are closed weekends and major holidays. You have access to a nurse at all times for urgent questions. Please call the main number to the clinic [phone] and follow the prompts.  For any non-urgent questions, you may also contact your provider using MyChart. We now offer e-Visits for anyone 74 and older to request care online for non-urgent symptoms. For details visit [email or web address].   Also download the MyChart app! Go to the app store, search "MyChart", open the app, select , and log in with your MyChart username and password.

## 2023-02-27 NOTE — Progress Notes (Signed)
Add cetirizine 10 mg IV and famotidine 20 mg IVPB to premedication for Gemcitabine.  Ok to proceed with elevated LFT's and low platelets.  T.O. Dr Carilyn Goodpasture, PharmD

## 2023-02-27 NOTE — Progress Notes (Signed)
AST-85 ALT-89 PLT-86 per MD to proceed with treatment. Treatment team made aware. Mag 1.4 per Dr Ellin Saba standing orders to proceed with Mag 4g IVPB with treatment.  Patient tolerated chemotherapy with no complaints voiced.  Side effects with management reviewed with understanding verbalized.  Port site clean and dry with no bruising or swelling noted at site.  Good blood return noted before and after administration of chemotherapy.  Band aid applied.  Patient left in satisfactory condition with VSS and no s/s of distress noted. All follow ups as scheduled. Pt escorted via wheelchair to be driven home by her friend.  Summer Parthasarathy [employer]

## 2023-03-04 ENCOUNTER — Encounter: Payer: Self-pay | Admitting: Hematology

## 2023-03-04 ENCOUNTER — Telehealth: Payer: Self-pay | Admitting: *Deleted

## 2023-03-04 ENCOUNTER — Encounter (INDEPENDENT_AMBULATORY_CARE_PROVIDER_SITE_OTHER): Payer: Self-pay | Admitting: *Deleted

## 2023-03-04 ENCOUNTER — Encounter (HOSPITAL_COMMUNITY): Payer: Self-pay | Admitting: Hematology

## 2023-03-04 NOTE — Telephone Encounter (Signed)
Please advise as to if you want labs checked or ER?

## 2023-03-04 NOTE — Telephone Encounter (Signed)
Dr. Ellin Saba is recommending that patient report to ER for continued rectal bleeding with increased weakness.  Patient referred to a gastroenterology and has an appointment 3/13.  Explained that we need to find out why she is bleeding and where it is coming from.  Verbalized understanding.  Message was sent via [facility], however had not been read, therefore followed up by phone.

## 2023-03-05 ENCOUNTER — Other Ambulatory Visit: Payer: Self-pay

## 2023-03-07 ENCOUNTER — Inpatient Hospital Stay (HOSPITAL_COMMUNITY)
Admission: EM | Admit: 2023-03-07 | Discharge: 2023-03-10 | DRG: 377 | Disposition: A | Discharge status: Home / Self Care | Payer: 59 | Attending: Internal Medicine | Admitting: Internal Medicine

## 2023-03-07 ENCOUNTER — Emergency Department (HOSPITAL_COMMUNITY): Payer: 59

## 2023-03-07 ENCOUNTER — Other Ambulatory Visit: Payer: Self-pay

## 2023-03-07 DIAGNOSIS — K922 Gastrointestinal hemorrhage, unspecified: Secondary | ICD-10-CM | POA: Diagnosis present

## 2023-03-07 DIAGNOSIS — D509 Iron deficiency anemia, unspecified: Secondary | ICD-10-CM | POA: Diagnosis present

## 2023-03-07 DIAGNOSIS — Z9049 Acquired absence of other specified parts of digestive tract: Secondary | ICD-10-CM

## 2023-03-07 DIAGNOSIS — Z860100 Personal history of colon polyps, unspecified: Secondary | ICD-10-CM

## 2023-03-07 DIAGNOSIS — I82412 Acute embolism and thrombosis of left femoral vein: Secondary | ICD-10-CM | POA: Diagnosis present

## 2023-03-07 DIAGNOSIS — K219 Gastro-esophageal reflux disease without esophagitis: Secondary | ICD-10-CM | POA: Diagnosis present

## 2023-03-07 DIAGNOSIS — R109 Unspecified abdominal pain: Secondary | ICD-10-CM | POA: Diagnosis present

## 2023-03-07 DIAGNOSIS — K2970 Gastritis, unspecified, without bleeding: Secondary | ICD-10-CM | POA: Diagnosis present

## 2023-03-07 DIAGNOSIS — J910 Malignant pleural effusion: Secondary | ICD-10-CM | POA: Diagnosis present

## 2023-03-07 DIAGNOSIS — K269 Duodenal ulcer, unspecified as acute or chronic, without hemorrhage or perforation: Secondary | ICD-10-CM | POA: Diagnosis present

## 2023-03-07 DIAGNOSIS — C569 Malignant neoplasm of unspecified ovary: Principal | ICD-10-CM | POA: Diagnosis present

## 2023-03-07 DIAGNOSIS — K2211 Ulcer of esophagus with bleeding: Secondary | ICD-10-CM | POA: Diagnosis not present

## 2023-03-07 DIAGNOSIS — E43 Unspecified severe protein-calorie malnutrition: Secondary | ICD-10-CM | POA: Diagnosis present

## 2023-03-07 DIAGNOSIS — C563 Malignant neoplasm of bilateral ovaries: Secondary | ICD-10-CM | POA: Diagnosis present

## 2023-03-07 DIAGNOSIS — R000 Tachycardia, unspecified: Secondary | ICD-10-CM | POA: Diagnosis not present

## 2023-03-07 DIAGNOSIS — K5730 Diverticulosis of large intestine without perforation or abscess without bleeding: Secondary | ICD-10-CM | POA: Diagnosis present

## 2023-03-07 DIAGNOSIS — R180 Malignant ascites: Secondary | ICD-10-CM | POA: Diagnosis present

## 2023-03-07 DIAGNOSIS — K449 Diaphragmatic hernia without obstruction or gangrene: Secondary | ICD-10-CM | POA: Diagnosis present

## 2023-03-07 DIAGNOSIS — Z8049 Family history of malignant neoplasm of other genital organs: Secondary | ICD-10-CM

## 2023-03-07 DIAGNOSIS — R7989 Other specified abnormal findings of blood chemistry: Secondary | ICD-10-CM | POA: Diagnosis present

## 2023-03-07 DIAGNOSIS — K2210 Ulcer of esophagus without bleeding: Secondary | ICD-10-CM | POA: Diagnosis present

## 2023-03-07 DIAGNOSIS — K643 Fourth degree hemorrhoids: Secondary | ICD-10-CM | POA: Diagnosis present

## 2023-03-07 DIAGNOSIS — E876 Hypokalemia: Secondary | ICD-10-CM | POA: Diagnosis present

## 2023-03-07 DIAGNOSIS — K921 Melena: Principal | ICD-10-CM | POA: Diagnosis present

## 2023-03-07 DIAGNOSIS — E8809 Other disorders of plasma-protein metabolism, not elsewhere classified: Secondary | ICD-10-CM | POA: Diagnosis present

## 2023-03-07 DIAGNOSIS — Z888 Allergy status to other drugs, medicaments and biological substances status: Secondary | ICD-10-CM

## 2023-03-07 DIAGNOSIS — Z66 Do not resuscitate: Secondary | ICD-10-CM | POA: Diagnosis present

## 2023-03-07 DIAGNOSIS — J449 Chronic obstructive pulmonary disease, unspecified: Secondary | ICD-10-CM | POA: Diagnosis not present

## 2023-03-07 DIAGNOSIS — I82432 Acute embolism and thrombosis of left popliteal vein: Secondary | ICD-10-CM | POA: Diagnosis not present

## 2023-03-07 DIAGNOSIS — F17210 Nicotine dependence, cigarettes, uncomplicated: Secondary | ICD-10-CM | POA: Diagnosis present

## 2023-03-07 DIAGNOSIS — M79605 Pain in left leg: Secondary | ICD-10-CM | POA: Diagnosis not present

## 2023-03-07 DIAGNOSIS — I82402 Acute embolism and thrombosis of unspecified deep veins of left lower extremity: Secondary | ICD-10-CM | POA: Insufficient documentation

## 2023-03-07 DIAGNOSIS — R197 Diarrhea, unspecified: Secondary | ICD-10-CM

## 2023-03-07 DIAGNOSIS — K2971 Gastritis, unspecified, with bleeding: Secondary | ICD-10-CM | POA: Diagnosis not present

## 2023-03-07 DIAGNOSIS — Z885 Allergy status to narcotic agent status: Secondary | ICD-10-CM

## 2023-03-07 DIAGNOSIS — Z79899 Other long term (current) drug therapy: Secondary | ICD-10-CM | POA: Diagnosis not present

## 2023-03-07 DIAGNOSIS — Z96642 Presence of left artificial hip joint: Secondary | ICD-10-CM | POA: Diagnosis present

## 2023-03-07 DIAGNOSIS — Z8249 Family history of ischemic heart disease and other diseases of the circulatory system: Secondary | ICD-10-CM

## 2023-03-07 DIAGNOSIS — K760 Fatty (change of) liver, not elsewhere classified: Secondary | ICD-10-CM | POA: Diagnosis not present

## 2023-03-07 DIAGNOSIS — Z79891 Long term (current) use of opiate analgesic: Secondary | ICD-10-CM

## 2023-03-07 DIAGNOSIS — Z886 Allergy status to analgesic agent status: Secondary | ICD-10-CM

## 2023-03-07 DIAGNOSIS — E785 Hyperlipidemia, unspecified: Secondary | ICD-10-CM | POA: Diagnosis present

## 2023-03-07 DIAGNOSIS — Z833 Family history of diabetes mellitus: Secondary | ICD-10-CM

## 2023-03-07 DIAGNOSIS — Z803 Family history of malignant neoplasm of breast: Secondary | ICD-10-CM

## 2023-03-07 DIAGNOSIS — T451X5A Adverse effect of antineoplastic and immunosuppressive drugs, initial encounter: Secondary | ICD-10-CM | POA: Diagnosis present

## 2023-03-07 DIAGNOSIS — I82409 Acute embolism and thrombosis of unspecified deep veins of unspecified lower extremity: Secondary | ICD-10-CM | POA: Diagnosis present

## 2023-03-07 DIAGNOSIS — K3189 Other diseases of stomach and duodenum: Secondary | ICD-10-CM | POA: Diagnosis not present

## 2023-03-07 DIAGNOSIS — M7989 Other specified soft tissue disorders: Secondary | ICD-10-CM | POA: Diagnosis not present

## 2023-03-07 DIAGNOSIS — D696 Thrombocytopenia, unspecified: Secondary | ICD-10-CM | POA: Diagnosis present

## 2023-03-07 DIAGNOSIS — C786 Secondary malignant neoplasm of retroperitoneum and peritoneum: Secondary | ICD-10-CM | POA: Diagnosis present

## 2023-03-07 DIAGNOSIS — R188 Other ascites: Secondary | ICD-10-CM | POA: Diagnosis not present

## 2023-03-07 DIAGNOSIS — Z95828 Presence of other vascular implants and grafts: Secondary | ICD-10-CM

## 2023-03-07 DIAGNOSIS — Z880 Allergy status to penicillin: Secondary | ICD-10-CM

## 2023-03-07 LAB — CBC
HCT: 29.3 % — ABNORMAL LOW (ref 36.0–46.0)
Hemoglobin: 9.6 g/dL — ABNORMAL LOW (ref 12.0–15.0)
MCH: 41 pg — ABNORMAL HIGH (ref 26.0–34.0)
MCHC: 32.8 g/dL (ref 30.0–36.0)
MCV: 125.2 fL — ABNORMAL HIGH (ref 80.0–100.0)
Platelets: 63 10*3/uL — ABNORMAL LOW (ref 150–400)
RBC: 2.34 MIL/uL — ABNORMAL LOW (ref 3.87–5.11)
RDW: 20.6 % — ABNORMAL HIGH (ref 11.5–15.5)
WBC: 5.8 10*3/uL (ref 4.0–10.5)
nRBC: 2.1 % — ABNORMAL HIGH (ref 0.0–0.2)

## 2023-03-07 LAB — COMPREHENSIVE METABOLIC PANEL
ALT: 65 U/L — ABNORMAL HIGH (ref 0–44)
AST: 68 U/L — ABNORMAL HIGH (ref 15–41)
Albumin: 2.4 g/dL — ABNORMAL LOW (ref 3.5–5.0)
Alkaline Phosphatase: 401 U/L — ABNORMAL HIGH (ref 38–126)
Anion gap: 12 (ref 5–15)
BUN: 10 mg/dL (ref 6–20)
CO2: 26 mmol/L (ref 22–32)
Calcium: 8.5 mg/dL — ABNORMAL LOW (ref 8.9–10.3)
Chloride: 100 mmol/L (ref 98–111)
Creatinine, Ser: 0.62 mg/dL (ref 0.44–1.00)
GFR, Estimated: >60 mL/min (ref >60)
Glucose, Bld: 108 mg/dL — ABNORMAL HIGH (ref 70–99)
Potassium: 3.2 mmol/L — ABNORMAL LOW (ref 3.5–5.1)
Sodium: 138 mmol/L (ref 135–145)
Total Bilirubin: 1.1 mg/dL (ref 0.0–1.2)
Total Protein: 6.1 g/dL — ABNORMAL LOW (ref 6.5–8.1)

## 2023-03-07 LAB — POC OCCULT BLOOD, ED: Fecal Occult Blood: POSITIVE — AB

## 2023-03-07 LAB — TYPE AND SCREEN
ABO/RH(D): O POS
Antibody Screen: NEGATIVE

## 2023-03-07 MED ORDER — SODIUM CHLORIDE 0.9 % IV BOLUS
1000.0000 mL | Freq: Once | INTRAVENOUS | Status: AC
  Administered 2023-03-07: 1000 mL via INTRAVENOUS

## 2023-03-07 MED ORDER — ONDANSETRON HCL 4 MG/2ML IJ SOLN
4.0000 mg | Freq: Once | INTRAMUSCULAR | Status: AC
  Administered 2023-03-07: 4 mg via INTRAVENOUS
  Filled 2023-03-07: qty 2

## 2023-03-07 MED ORDER — HYDROMORPHONE HCL 1 MG/ML IJ SOLN: 1.0000 mg | INTRAMUSCULAR | Status: DC | PRN

## 2023-03-07 MED ORDER — HYDROMORPHONE HCL 1 MG/ML IJ SOLN
2.0000 mg | INTRAMUSCULAR | Status: DC | PRN
  Administered 2023-03-07 – 2023-03-08 (×4): 2 mg via INTRAVENOUS
  Filled 2023-03-07: qty 0.5
  Filled 2023-03-07: qty 1.5
  Filled 2023-03-07 (×3): qty 2

## 2023-03-07 MED ORDER — POTASSIUM CHLORIDE CRYS ER 20 MEQ PO TBCR
40.0000 meq | EXTENDED_RELEASE_TABLET | Freq: Once | ORAL | Status: AC
  Administered 2023-03-07: 40 meq via ORAL
  Filled 2023-03-07: qty 2

## 2023-03-07 MED ORDER — HYDROMORPHONE HCL 1 MG/ML IJ SOLN
3.0000 mg | Freq: Once | INTRAMUSCULAR | Status: AC
  Administered 2023-03-07: 3 mg via INTRAVENOUS
  Filled 2023-03-07: qty 3

## 2023-03-07 MED ORDER — DIAZEPAM 5 MG PO TABS
5.0000 mg | ORAL_TABLET | Freq: Every day | ORAL | Status: DC
  Administered 2023-03-07 – 2023-03-09 (×3): 5 mg via ORAL
  Filled 2023-03-07 (×3): qty 1

## 2023-03-07 MED ORDER — HYDROMORPHONE HCL 1 MG/ML IJ SOLN
1.0000 mg | Freq: Once | INTRAMUSCULAR | Status: AC
  Administered 2023-03-07: 1 mg via INTRAVENOUS
  Filled 2023-03-07: qty 1

## 2023-03-07 MED ORDER — HYDROMORPHONE HCL 1 MG/ML IJ SOLN: 0.5000 mg | INTRAMUSCULAR | Status: DC | PRN

## 2023-03-07 MED ORDER — ONDANSETRON 4 MG PO TBDP
4.0000 mg | ORAL_TABLET | Freq: Three times a day (TID) | ORAL | Status: DC | PRN
  Administered 2023-03-07 – 2023-03-08 (×2): 4 mg via ORAL
  Filled 2023-03-07 (×2): qty 1

## 2023-03-07 NOTE — H&P (Signed)
History and Physical    Patient: Michelle Holt:[id number] DOB: [birth year] DOA: 03/07/2023 DOS: the patient was seen and examined on 03/07/2023 PCP: Ignatius Specking, MD  Patient coming from: Home  Chief Complaint:  Chief Complaint  Patient presents with   GI Bleeding   HPI: Michelle Holt is a 53 y.o. 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  She presents for bright red blood per rectum and left leg pain.   Patient reports that she has had bloody stools on and off for the last 1 month but they have been getting worse and persistent every day over the last 1 week.  She reports the color of her stools are gray or white, though occasionally "golden."  She reports blood clots are mixed in.  She also endorses that her stools are very loose and watery and she feels that she is losing more than she is taking in.  She endorses weight loss.  She reports that her stools have been discolored for the last 4 months and she has scheduled outpatient appt with gastroenterology but the soonest appointment she could get was 3/13 so she was advised to come to the hospital instead. She also reports that she began having left leg pain yesterday and when discussing it with her home palliative care nurse was advised to come to the ED. Denies SOB or palpitations. On arrival to the ED patient is hemodynamically stable though intermittently tachycardic.  Diagnostics reveal left leg DVT.  Lab work reveals Hemoccult positive, microcytic anemia to 9.6, thrombocytopenia to 63.  CMP remarkable for elevated alk phos, elevated transaminases, and hypokalemia. At this time patient denies any fever, or vomiting but does endorse nausea.  She also reports persistent fatigue and abdominal pain.  She takes high dose opioids outpatient.   Ovarian Cancer IV diagnosed June 2020, was in remission for 18months. Has  since spread to peritoneum. Had allergic reaction to last carboplatin so it was held. Last treatment was Tuesday, next is scheduled for  2/25.     Review of Systems: As mentioned in the history of present illness. All other systems reviewed and are negative. Past Medical History:  Diagnosis Date   Anemia    Anxiety and depression    Arthritis of facet joints at multiple vertebral levels    L5-S1   Constipation    Dyslipidemia    Family history of breast cancer    Family history of uterine cancer    GERD (gastroesophageal reflux disease)    History of hiatal hernia    History of kidney stones    Insomnia    Irritable bowel syndrome    Migraine    Muscle tension headache    Neuropathy of finger    Ovarian carcinoma (HCC)    ovarian   Plantar fasciitis of right foot    Port-A-Cath in place 08/20/2018   Past Surgical History:  Procedure Laterality Date   ABDOMINAL HYSTERECTOMY     BIOPSY  10/27/2020   Procedure: BIOPSY;  Surgeon: Dolores Frame, MD;  [facility];  Service: Gastroenterology;;   CHOLECYSTECTOMY  2008   COLONOSCOPY N/A 08/13/2013   Procedure: COLONOSCOPY;  Surgeon: Malissa Hippo, MD;  [facility]: [facility];  Service: Endoscopy;  Laterality: N/A;  230-moved to 145 Ann to notify pt   COLONOSCOPY WITH PROPOFOL N/A 11/15/2020   Procedure: COLONOSCOPY WITH PROPOFOL;  Surgeon:  Malissa Hippo, MD;  [facility]: [facility];  Service: Endoscopy;  Laterality: N/A;  1:40   ESOPHAGEAL DILATION N/A 02/28/2021   Procedure: ESOPHAGEAL DILATION;  Surgeon: Malissa Hippo, MD;  [facility];  Service: Endoscopy;  Laterality: N/A;   ESOPHAGOGASTRODUODENOSCOPY     ESOPHAGOGASTRODUODENOSCOPY (EGD) WITH PROPOFOL N/A 07/20/2018   Procedure: ESOPHAGOGASTRODUODENOSCOPY (EGD) WITH PROPOFOL;  Surgeon: Malissa Hippo, MD;  [facility];  Service: Endoscopy;  Laterality: N/A;  Possible esophageal dilation.   ESOPHAGOGASTRODUODENOSCOPY (EGD)  WITH PROPOFOL N/A 10/27/2020   Procedure: ESOPHAGOGASTRODUODENOSCOPY (EGD) WITH PROPOFOL;  Surgeon: Dolores Frame, MD;  [facility];  Service: Gastroenterology;  Laterality: N/A;  2:10, pt knows to arrive at 10:15   ESOPHAGOGASTRODUODENOSCOPY (EGD) WITH PROPOFOL N/A 02/28/2021   Procedure: ESOPHAGOGASTRODUODENOSCOPY (EGD) WITH PROPOFOL;  Surgeon: Malissa Hippo, MD;  [facility];  Service: Endoscopy;  Laterality: N/A;  200 ASA 1   ESOPHAGOGASTRODUODENOSCOPY (EGD) WITH PROPOFOL N/A 03/29/2022   Procedure: ESOPHAGOGASTRODUODENOSCOPY (EGD) WITH PROPOFOL;  Surgeon: Dolores Frame, MD;  [facility];  Service: Gastroenterology;  Laterality: N/A;  2:00 pm, asa 1-2   GIVENS CAPSULE STUDY N/A 11/24/2020   Procedure: GIVENS CAPSULE STUDY;  Surgeon: Malissa Hippo, MD;  [facility];  Service: Endoscopy;  Laterality: N/A;  7:30   IR ANGIOGRAM SELECTIVE EACH ADDITIONAL VESSEL  08/01/2018   IR ANGIOGRAM VISCERAL SELECTIVE  08/01/2018   IR EMBO ART  VEN HEMORR LYMPH EXTRAV  INC GUIDE ROADMAPPING  08/01/2018   IR IMAGING GUIDED PORT INSERTION  08/20/2018   IR PERC PLEURAL DRAIN W/INDWELL CATH W/IMG GUIDE  07/08/2018   IR THORACENTESIS ASP PLEURAL SPACE W/IMG GUIDE  07/07/2018   IR US GUIDE VASC ACCESS RIGHT  08/01/2018   PLEURAL EFFUSION DRAINAGE Left 07/13/2018   Procedure: DRAINAGE OF LOCULATED PLEURAL EFFUSION;  Surgeon: Kerin Perna, MD;  Location: [facility] OR;  Service: Thoracic;  Laterality: Left;   POLYPECTOMY  11/15/2020   Procedure: POLYPECTOMY;  Surgeon: Malissa Hippo, MD;  [facility];  Service: Endoscopy;;   REMOVAL OF PLEURAL DRAINAGE CATHETER Left 08/20/2018   Procedure: REMOVAL OF PLEURAL DRAINAGE CATHETER;  Surgeon: Kerin Perna, MD;  Location[facility] OR;  Service: Thoracic;  Laterality: Left;   REMOVAL OF PLEURAL DRAINAGE CATHETER Left 08/20/2018   Procedure: REMOVAL OF PLEURAL DRAINAGE CATHETER;  Surgeon: Kerin Perna, MD;  Location[facility] OR;  Service: Thoracic;  Laterality: Left;   TALC PLEURODESIS Left 07/13/2018   Procedure: Talc Pleuradesis;  Surgeon: Donata Clay, Theron Arista, MD;  Location: [facility] OR;  Service: Thoracic;  Laterality: Left;   TOTAL HIP ARTHROPLASTY Left 06/05/2020   Procedure: LEFT TOTAL HIP ARTHROPLASTY ANTERIOR APPROACH;  Surgeon: Tarry Kos, MD;  Location: [facility] OR;  Service: Orthopedics;  Laterality: Left;  3-C   TUBAL LIGATION Bilateral    UTERINE ABLATION     VIDEO ASSISTED THORACOSCOPY Left 07/13/2018   Procedure: VIDEO ASSISTED THORACOSCOPY;  Surgeon: Kerin Perna, MD;  Location: [facility] OR;  Service: Thoracic;  Laterality: Left;   Social History:  reports that she has been smoking cigarettes. She started smoking about 21 years ago. She has a 8.6 pack-year smoking history. She has never used smokeless tobacco. She reports that she does not currently use alcohol after a past usage of about 1.0 standard drink of alcohol per week. She reports that she does not use drugs.  Allergies  Allergen Reactions  Carboplatin Shortness Of Breath    SOB and flushing of the skin.   Morphine And Codeine Itching   Nickel Itching   Nortriptyline Other (See Comments)    Significant weight gain   Topamax [Topiramate] Diarrhea and Nausea Only   Xanax [Alprazolam] Other (See Comments)    "Can't wake up"   Actifed Cold-Allergy [Chlorpheniramine-Phenyleph Er] Rash and Other (See Comments)    Red dye only   Amoxicillin Rash   Codeine Hives   Erythromycin Rash   Gemzar [Gemcitabine] Other (See Comments)    Feeling flushed, red in the face   Penicillins Rash        Red Dye Rash   Sudafed [Pseudoephedrine Hcl] Rash and Other (See Comments)    Red dye only    Family History  Problem Relation Age of Onset   Depression Mother    Hypertension Mother    Obesity Mother    Diabetes Mother    Kidney disease Mother    Peripheral vascular disease Father    Atrial fibrillation Father    Crohn's disease  Sister    Uterine cancer Sister 46       maternal half sister   COPD Brother    Osteoporosis Brother    Breast cancer Maternal Aunt 50   Colon cancer Neg Hx     Prior to Admission medications   Medication Sig Start Date End Date Taking? Authorizing Provider  albuterol (VENTOLIN HFA) 108 (90 Base) MCG/ACT inhaler Inhale 2 puffs into the lungs every 6 (six) hours as needed for wheezing or shortness of breath.    [provider]  diazepam (VALIUM) 5 MG tablet Take 1 tablet (5 mg total) by mouth at bedtime. 09/05/22   Doreatha Massed, MD  dicyclomine (BENTYL) 20 MG tablet Take 1 tablet (20 mg total) by mouth every 6 (six) hours as needed for spasms. 01/07/23   Doreatha Massed, MD  feeding supplement (ENSURE ENLIVE / ENSURE PLUS) LIQD Take 237 mLs by mouth 3 (three) times daily between meals. 02/12/23   Vassie Loll, MD  HYDROmorphone (DILAUDID) 8 MG tablet Take 1 tablet (8 mg total) by mouth every 4 (four) hours as needed ([facility]). 02/10/23   Doreatha Massed, MD  LORazepam (ATIVAN) 0.5 MG tablet Take 0.5 mg by mouth 3 (three) times daily as needed. 02/04/23   [provider]  magic mouthwash w/lidocaine SOLN Take 5 mLs by mouth 4 (four) times daily as needed for mouth pain. Suspension contains equal amounts of Maalox Extra Strength, nystatin, diphenhydramine and lidocaine. 01/16/23   Doreatha Massed, MD  metoCLOPramide (REGLAN) 10 MG tablet Take 1 tablet (10 mg total) by mouth every 8 (eight) hours as needed for refractory nausea / vomiting. 02/12/23 02/12/24  Vassie Loll, MD  naloxegol oxalate (MOVANTIK) 25 MG TABS tablet Take 1 tablet (25 mg total) by mouth daily. 02/12/23   Vassie Loll, MD  ondansetron (ZOFRAN) 4 MG tablet TAKE 1 TABLET BY MOUTH EVERY 8 HOURS AS NEEDED FOR NAUSEA AND VOMITING 02/17/23   Doreatha Massed, MD  ondansetron (ZOFRAN-ODT) 4 MG disintegrating tablet Take 1 tablet (4 mg total) by mouth every 8 (eight) hours as needed for  nausea or vomiting. 01/22/23   Doreatha Massed, MD  oxyCODONE (OXYCONTIN) 60 MG 12 hr tablet Take 1 tablet by mouth every 12 (twelve) hours as needed. 01/20/23   Doreatha Massed, MD  pantoprazole (PROTONIX) 40 MG tablet Take 1 tablet (40 mg total) by mouth daily. 02/12/23 02/12/24  Vassie Loll, MD  Physical Exam: Vitals:   03/07/23 1138 03/07/23 1336 03/07/23 1337  BP: (!) 106/46 108/67   Pulse: (!) 132    Resp: 16 16   Temp: 98.1 F (36.7 C)    TempSrc: Oral    SpO2: 100%  95%   Constitutional:  Normal appearance. Non toxic-appearing.  HENT: Head Normocephalic and atraumatic.  Mucous membranes are moist.  Eyes:  Extraocular intact. Conjunctivae normal. Pupils are equal, round, and reactive to light.  Cardiovascular: Rate and Rhythm: Normal rate and regular rhythm.  Pulmonary: Non labored, symmetric rise of chest wall.  Musculoskeletal:  Normal range of motion.  Skin: warm and dry. not jaundiced.  Neurological: No focal deficit present. alert. Oriented. Psychiatric: Mood and Affect congruent.   Data Reviewed:  Lab Results  Component Value Date   WBC 5.8 03/07/2023   HGB 9.6 (L) 03/07/2023   HCT 29.3 (L) 03/07/2023   MCV 125.2 (H) 03/07/2023   PLT 63 (L) 03/07/2023      Latest Ref Rng & Units 03/07/2023    1:01 PM 02/27/2023   11:59 AM 02/18/2023    9:01 AM  BMP  Glucose 70 - 99 mg/dL 454  87  81   BUN 6 - 20 mg/dL 10  10  15    Creatinine 0.44 - 1.00 mg/dL 0.98  1.19  1.47   Sodium 135 - 145 mmol/L 138  136  135   Potassium 3.5 - 5.1 mmol/L 3.2  3.8  4.3   Chloride 98 - 111 mmol/L 100  98  95   CO2 22 - 32 mmol/L 26  27  28    Calcium 8.9 - 10.3 mg/dL 8.5  8.7  8.3    US Venous Img Lower Unilateral Left CLINICAL DATA:  Left calf swelling  EXAM: LEFT LOWER EXTREMITY VENOUS DOPPLER ULTRASOUND  TECHNIQUE: Gray-scale sonography with graded compression, as well as color Doppler and duplex ultrasound were performed to evaluate the lower extremity deep venous  systems from the level of the common femoral vein and including the common femoral, femoral, profunda femoral, popliteal and calf veins including the posterior tibial, peroneal and gastrocnemius veins when visible. The superficial great saphenous vein was also interrogated. Spectral Doppler was utilized to evaluate flow at rest and with distal augmentation maneuvers in the common femoral, femoral and popliteal veins.  COMPARISON:  None Available.  FINDINGS: Contralateral Common Femoral Vein: Respiratory phasicity is normal and symmetric with the symptomatic side. No evidence of thrombus. Normal compressibility.  Common Femoral Vein: No evidence of thrombus. Normal compressibility, respiratory phasicity and response to augmentation.  Saphenofemoral Junction: No evidence of thrombus. Normal compressibility and flow on color Doppler imaging.  Profunda Femoral Vein: No evidence of thrombus. Normal compressibility and flow on color Doppler imaging.  Femoral Vein: Femoral vein is patent and unremarkable in the upper thigh. However, the vessel becomes noncompressible in the mid thigh. No evidence of color flow on color Doppler imaging. Findings are consistent with occlusive thrombus. Occlusive thrombus extends through the distal.  Popliteal Vein: Occlusive thrombus extends into the popliteal in pain.  Calf Veins: Occlusive thrombus extends into the calf veins.  Superficial Great Saphenous Vein: No evidence of thrombus. Normal compressibility.  Venous Reflux:  None.  Other Findings:  None.  IMPRESSION: Positive for acute occlusive DVT in the left lower extremity from the femoral vein in the mid thigh through the popliteal vein and into the proximal calf veins.  Electronically Signed   By: Isac Caddy.D.  On: 03/07/2023 13:30    Assessment and Plan: Left leg DVT - Patient is not a candidate for anticoagulation given thrombocytopenia and acute GI bleed -  Interventional radiology has been consulted for IVC filter placement.  Have discussed directly with interventional radiologist, Dr. Lowella Dandy, patient is pending transfer to [facility] to complete procedure.  Thrombocytopenia - Likely 2/2 to chemo - Acute worsening, baseline >200 one month ago. Now 63 - Hold anticoagulation. IVC as above - Follows with Onco Dr. Ellin Saba outpatient - Transfuse plt if <20 - Daily CBC  Bright red blood per rectum Lower GI bleed - Been ongoing for 1 month, acutely worse over the last 1 week. - Hemoglobin remains stable at 9.6 - Consult gastroenterology at [facility] stools - Pt endorses white and gray stools but also occasionally "golden" unclear if true acholic stool. - LFTs mildly elevated, bili WNL. -- GI consult as above  Microcytic Anemia - Hgb near baseline currently. Cont to trend - Transfuse if <7  Evaded transaminases - Likely secondary to cancer and chemo - Continue to trend CMP - Possible acholic stools as above.  Stage IV High grade serous ovarian cancer Peritoneal carcinomatosis - Follows outpatient with oncology, next chemotherapy scheduled for 2/25 -- Port-a-cath in place - On chronic opioids, cont home dose -- Onco Dr. Ellin Saba outpatient - Patient has home palliative and has discussed hospice in the past but currently prefers ongoing aggressive treatment:  Carboplatin and paclitaxel x 7 cycles from 08/24/2018 to 03/15/2019.  Laparoscopic TAH & BSO & omenectomy on 12/24/2018. 6 cycles of carboplatin and paclitaxel from 03/05/2021 to 06/18/2021  Hypokalemia - Replace as needed  Hypoalbuminemia - Suspect malnutrition given malignancy, poor p.o. intake, high GI output, and ongoing chemotherapy - Will consult dietitian    Advance Care Planning: Patient has an advanced directive. She reports, in the presence of her daughter, that she would like to be DO NOT RESUSCITATE. She is okay with intubation if in respiratory  failure and has meaningful hope of recovery.   Consults: Interventional Radiology. Please consult GI on arrival to [facility]  Family Communication: Discussed directly with patient and patient's daughter at bedside  Severity of Illness: The appropriate patient status for this patient is INPATIENT. Inpatient status is judged to be reasonable and necessary in order to provide the required intensity of service to ensure the patient's safety. The patient's presenting symptoms, physical exam findings, and initial radiographic and laboratory data in the context of their chronic comorbidities is felt to place them at high risk for further clinical deterioration. Furthermore, it is not anticipated that the patient will be medically stable for discharge from the hospital within 2 midnights of admission.   * I certify that at the point of admission it is my clinical judgment that the patient will require inpatient hospital care spanning beyond 2 midnights from the point of admission due to high intensity of service, high risk for further deterioration and high frequency of surveillance required.*  Author: Debarah Crape, DO 03/07/2023 2:30 PM  For on call review [email or web address].

## 2023-03-07 NOTE — ED Notes (Signed)
 Patient transported to ultrasound.

## 2023-03-07 NOTE — ED Provider Notes (Signed)
[place] EMERGENCY DEPARTMENT AT [facility] Provider Note   CSN: [id number] Arrival date & time: 03/07/23  1124     History  Chief Complaint  Patient presents with   GI Bleeding    Michelle Holt is a 53 y.o. female.  She is presenting with generalized abdominal pain that is been going on for months.  She has new diarrhea that she says is blood that been going on for a week.  She tells me it is gray or golden in color.  She also has had left leg swelling and pain for few days.  She is under the care of Dr. Ellin Saba for stage IV ovarian cancer.  She had to stop her chemotherapy, states she was allergic to the last dose.  Was recently admitted last month for an SBO.  CT showed mild to moderate ascites and peritoneal carcinomatosis.  Was treated nonoperatively.  For p.o. intake and very weak.  Denies any vaginal bleeding or discharge.  No other overt bleeding.  The history is provided by the patient.  Abdominal Pain Pain location:  Generalized Pain quality: aching   Pain severity:  Severe Onset quality:  Gradual Timing:  Constant Progression:  Worsening Chronicity:  Chronic Relieved by:  Nothing Associated symptoms: diarrhea, fatigue, hematochezia and nausea   Associated symptoms: no chest pain, no fever, no hematemesis, no hematuria, no shortness of breath and no vomiting        Home Medications Prior to Admission medications   Medication Sig Start Date End Date Taking? Authorizing Provider  albuterol (VENTOLIN HFA) 108 (90 Base) MCG/ACT inhaler Inhale 2 puffs into the lungs every 6 (six) hours as needed for wheezing or shortness of breath.    [provider]  diazepam (VALIUM) 5 MG tablet Take 1 tablet (5 mg total) by mouth at bedtime. 09/05/22   Doreatha Massed, MD  dicyclomine (BENTYL) 20 MG tablet Take 1 tablet (20 mg total) by mouth every 6 (six) hours as needed for spasms. 01/07/23   Doreatha Massed, MD  feeding supplement (ENSURE ENLIVE /  ENSURE PLUS) LIQD Take 237 mLs by mouth 3 (three) times daily between meals. 02/12/23   Vassie Loll, MD  HYDROmorphone (DILAUDID) 8 MG tablet Take 1 tablet (8 mg total) by mouth every 4 (four) hours as needed ([facility]). 02/10/23   Doreatha Massed, MD  LORazepam (ATIVAN) 0.5 MG tablet Take 0.5 mg by mouth 3 (three) times daily as needed. 02/04/23   [provider]  magic mouthwash w/lidocaine SOLN Take 5 mLs by mouth 4 (four) times daily as needed for mouth pain. Suspension contains equal amounts of Maalox Extra Strength, nystatin, diphenhydramine and lidocaine. 01/16/23   Doreatha Massed, MD  metoCLOPramide (REGLAN) 10 MG tablet Take 1 tablet (10 mg total) by mouth every 8 (eight) hours as needed for refractory nausea / vomiting. 02/12/23 02/12/24  Vassie Loll, MD  naloxegol oxalate (MOVANTIK) 25 MG TABS tablet Take 1 tablet (25 mg total) by mouth daily. 02/12/23   Vassie Loll, MD  ondansetron (ZOFRAN) 4 MG tablet TAKE 1 TABLET BY MOUTH EVERY 8 HOURS AS NEEDED FOR NAUSEA AND VOMITING 02/17/23   Doreatha Massed, MD  ondansetron (ZOFRAN-ODT) 4 MG disintegrating tablet Take 1 tablet (4 mg total) by mouth every 8 (eight) hours as needed for nausea or vomiting. 01/22/23   Doreatha Massed, MD  oxyCODONE (OXYCONTIN) 60 MG 12 hr tablet Take 1 tablet by mouth every 12 (twelve) hours as needed. 01/20/23   Doreatha Massed,  MD  pantoprazole (PROTONIX) 40 MG tablet Take 1 tablet (40 mg total) by mouth daily. 02/12/23 02/12/24  Vassie Loll, MD      Allergies    Carboplatin, Morphine and codeine, Nickel, Nortriptyline, Topamax [topiramate], Xanax [alprazolam], Actifed cold-allergy [chlorpheniramine-phenyleph er], Amoxicillin, Codeine, Erythromycin, Gemzar [gemcitabine], Penicillins, Red dye, and Sudafed [pseudoephedrine hcl]    Review of Systems   Review of Systems  Constitutional:  Positive for fatigue. Negative for fever.  Respiratory:  Negative for shortness of breath.    Cardiovascular:  Negative for chest pain.  Gastrointestinal:  Positive for abdominal pain, diarrhea, hematochezia and nausea. Negative for hematemesis and vomiting.  Genitourinary:  Negative for hematuria.    Physical Exam Updated Vital Signs BP (!) 106/46   Pulse (!) 132   Temp 98.1 F (36.7 C) (Oral)   Resp 16   LMP  (LMP Unknown)   SpO2 100%  Physical Exam Vitals and nursing note reviewed.  Constitutional:      General: She is not in acute distress.    Appearance: Normal appearance. She is well-developed.  HENT:     Head: Normocephalic and atraumatic.  Eyes:     Conjunctiva/sclera: Conjunctivae normal.  Cardiovascular:     Rate and Rhythm: Normal rate and regular rhythm.     Heart sounds: No murmur heard. Pulmonary:     Effort: Pulmonary effort is normal. No respiratory distress.     Breath sounds: Normal breath sounds.  Abdominal:     Palpations: Abdomen is soft.     Tenderness: There is abdominal tenderness. There is no guarding or rebound.  Musculoskeletal:        General: Tenderness (left leg) present.     Cervical back: Neck supple.     Right lower leg: No edema.     Left lower leg: No edema.  Skin:    General: Skin is warm and dry.     Capillary Refill: Capillary refill takes less than 2 seconds.  Neurological:     General: No focal deficit present.     Mental Status: She is alert.     Motor: No weakness.     ED Results / Procedures / Treatments   Labs (all labs ordered are listed, but only abnormal results are displayed) Labs Reviewed  COMPREHENSIVE METABOLIC PANEL - Abnormal; Notable for the following components:      Result Value   Potassium 3.2 (*)    Glucose, Bld 108 (*)    Calcium 8.5 (*)    Total Protein 6.1 (*)    Albumin 2.4 (*)    AST 68 (*)    ALT 65 (*)    Alkaline Phosphatase 401 (*)    All other components within normal limits  CBC - Abnormal; Notable for the following components:   RBC 2.34 (*)    Hemoglobin 9.6 (*)    HCT  29.3 (*)    MCV 125.2 (*)    MCH 41.0 (*)    RDW 20.6 (*)    Platelets 63 (*)    nRBC 2.1 (*)    All other components within normal limits  POC OCCULT BLOOD, ED - Abnormal; Notable for the following components:   Fecal Occult Blood Positive (*)    All other components within normal limits  GASTROINTESTINAL PANEL BY PCR, STOOL (REPLACES STOOL CULTURE)  PROTIME-INR  APTT  CBC  COMPREHENSIVE METABOLIC PANEL  CBC WITH DIFFERENTIAL/PLATELET  MAGNESIUM  PHOSPHORUS  TYPE AND SCREEN    EKG EKG Interpretation Date/Time:  Friday March 07 2023 13:34:54 EST Ventricular Rate:  97 PR Interval:  134 QRS Duration:  91 QT Interval:  345 QTC Calculation: 439 R Axis:   114  Text Interpretation: Sinus rhythm Right axis deviation improved ischemic changes from prior 10/23 Confirmed by Meridee Score [phone]) on 03/07/2023 6:47:58 PM  Radiology US Venous Img Lower Unilateral Left Result Date: 03/07/2023 CLINICAL DATA:  Left calf swelling EXAM: LEFT LOWER EXTREMITY VENOUS DOPPLER ULTRASOUND TECHNIQUE: Gray-scale sonography with graded compression, as well as color Doppler and duplex ultrasound were performed to evaluate the lower extremity deep venous systems from the level of the common femoral vein and including the common femoral, femoral, profunda femoral, popliteal and calf veins including the posterior tibial, peroneal and gastrocnemius veins when visible. The superficial great saphenous vein was also interrogated. Spectral Doppler was utilized to evaluate flow at rest and with distal augmentation maneuvers in the common femoral, femoral and popliteal veins. COMPARISON:  None Available. FINDINGS: Contralateral Common Femoral Vein: Respiratory phasicity is normal and symmetric with the symptomatic side. No evidence of thrombus. Normal compressibility. Common Femoral Vein: No evidence of thrombus. Normal compressibility, respiratory phasicity and response to augmentation. Saphenofemoral Junction:  No evidence of thrombus. Normal compressibility and flow on color Doppler imaging. Profunda Femoral Vein: No evidence of thrombus. Normal compressibility and flow on color Doppler imaging. Femoral Vein: Femoral vein is patent and unremarkable in the upper thigh. However, the vessel becomes noncompressible in the mid thigh. No evidence of color flow on color Doppler imaging. Findings are consistent with occlusive thrombus. Occlusive thrombus extends through the distal. Popliteal Vein: Occlusive thrombus extends into the popliteal in pain. Calf Veins: Occlusive thrombus extends into the calf veins. Superficial Great Saphenous Vein: No evidence of thrombus. Normal compressibility. Venous Reflux:  None. Other Findings:  None. IMPRESSION: Positive for acute occlusive DVT in the left lower extremity from the femoral vein in the mid thigh through the popliteal vein and into the proximal calf veins. Electronically Signed   By: Malachy Moan M.D.   On: 03/07/2023 13:30    Procedures Procedures    Medications Ordered in ED Medications  ondansetron (ZOFRAN-ODT) disintegrating tablet 4 mg (4 mg Oral Given 03/07/23 1823)  HYDROmorphone (DILAUDID) injection 1 mg (1 mg Intravenous Given 03/07/23 1310)  ondansetron (ZOFRAN) injection 4 mg (4 mg Intravenous Given 03/07/23 1310)  sodium chloride 0.9 % bolus 1,000 mL (0 mLs Intravenous Stopped 03/07/23 1411)  HYDROmorphone (DILAUDID) injection 3 mg (3 mg Intravenous Given 03/07/23 1554)  potassium chloride SA (KLOR-CON M) CR tablet 40 mEq (40 mEq Oral Given 03/07/23 1552)    ED Course/ Medical Decision Making/ A&P Clinical Course as of 03/07/23 1847  Fri Mar 07, 2023  1416 Since duplex positive for DVT.  She is also heme positive from below.  Labs are about baseline for her.  No drop in hemoglobin.  She said she follows with Dr. Levon Hedger from GI.  Feel she needs admission to the hospital because she is very complicated and is not straightforward to anticoagulate her  at this time.  Discussed with [facility] hospitalist Dr. Rennis Chris will see in consult [MB]    Clinical Course User Index [MB] Terrilee Files, MD                                 Medical Decision Making Amount and/or Complexity of Data Reviewed Labs: ordered.  Risk Prescription drug management. Decision regarding  hospitalization.   This patient complains of left leg pain, rectal bleeding, diarrhea, worsening chronic abdominal pain; this involves an extensive number of treatment Options and is a complaint that carries with it a high risk of complications and morbidity. The differential includes GI bleed, worsening malignancy, DVT, symptomatic anemia, metabolic derangement  I ordered, reviewed and interpreted labs, which included CBC with chronic low hemoglobin, newly low platelets, chemistries with low potassium elevated LFTs, fecal occult positive I ordered medication IV pain medicine nausea medication and reviewed PMP when indicated. I ordered imaging studies which included duplex left lower extremity  and I independently    visualized and interpreted imaging which showed acute DVT Additional history obtained from patient's family members Previous records obtained and reviewed in epic including recent oncology notes I consulted [facility] hospitalist Dr. Rennis Chris and discussed lab and imaging findings and discussed disposition.  Cardiac monitoring reviewed, sinus tachycardia Social determinants considered, tobacco use, depression Critical Interventions: None  After the interventions stated above, I reevaluated the patient and found patient to be tachycardic although otherwise fairly asymptomatic at this time Admission and further testing considered, she would benefit from mission the hospital for further GI investigation along with treatment of her acute DVT.         Final Clinical Impression(s) / ED Diagnoses Final diagnoses:  Malignant neoplasm of ovary, unspecified laterality (HCC)   Acute GI bleeding  Thrombocytopenia (HCC)  Acute deep vein thrombosis (DVT) of left lower extremity, unspecified vein (HCC)    Rx / DC Orders ED Discharge Orders     None         Terrilee Files, MD 03/07/23 1851

## 2023-03-07 NOTE — Consult Note (Signed)
Chief Complaint: Patient was seen in consultation today for lower extremity DVT  Referring Physician(s): Debarah Crape, DO  Supervising Physician: Richarda Overlie  Patient Status: [facility] ED; transferring to [facility]  History of Present Illness: Michelle Holt is a 53 y.o. 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 During initial workup in the ED she was noted to be hemodynamically stable, (+) FOBT, hgb 9.6 which is near baseline, plt 63 (previously ~200 for the last month).   She underwent left lower extremity US which showed:  Positive for acute occlusive DVT in the left lower extremity from the femoral vein in the mid thigh through the popliteal vein and into the proximal calf veins  IR has been consulted for IVC filter placement in the setting on GI bleeding which precludes anticoagulation for DVT treatment.   Patient seen in the ED with daughter at bedside, Ms. Madia reports a lot of abdominal pain and left leg pain, she is hoping for some pain medication soon. She reports intermittent bright red blood mixed in with stools which are gray or white, most stools are very loose and watery.  She is aware of her lab results and the request for IVC filter, she tells me that her son had an IVC filter placed so she is familiar with them. She would like sedation for the procedure and is aware that she will need to be transferred to [facility] for the procedure. She is agreeable to proceed.  Past Medical History:  Diagnosis Date   Anemia    Anxiety and depression    Arthritis of facet joints at multiple vertebral levels    L5-S1   Constipation    Dyslipidemia    Family history of breast cancer    Family history of uterine  cancer    GERD (gastroesophageal reflux disease)    History of hiatal hernia    History of kidney stones    Insomnia    Irritable bowel syndrome    Migraine    Muscle tension headache    Neuropathy of finger    Ovarian carcinoma (HCC)    ovarian   Plantar fasciitis of right foot    Port-A-Cath in place 08/20/2018    Past Surgical History:  Procedure Laterality Date   ABDOMINAL HYSTERECTOMY     BIOPSY  10/27/2020   Procedure: BIOPSY;  Surgeon: Dolores Frame, MD;  [facility];  Service: Gastroenterology;;   CHOLECYSTECTOMY  2008   COLONOSCOPY N/A 08/13/2013   Procedure: COLONOSCOPY;  Surgeon: Malissa Hippo, MD;  [facility];  Service: Endoscopy;  Laterality: N/A;  230-moved to 145 Ann to notify pt   COLONOSCOPY WITH PROPOFOL N/A 11/15/2020   Procedure: COLONOSCOPY WITH PROPOFOL;  Surgeon: Malissa Hippo, MD;  [facility];  Service: Endoscopy;  Laterality: N/A;  1:40   ESOPHAGEAL DILATION N/A 02/28/2021   Procedure: ESOPHAGEAL DILATION;  Surgeon: Malissa Hippo, MD;  [facility];  Service: Endoscopy;  Laterality: N/A;   ESOPHAGOGASTRODUODENOSCOPY     ESOPHAGOGASTRODUODENOSCOPY (EGD) WITH PROPOFOL N/A 07/20/2018   Procedure: ESOPHAGOGASTRODUODENOSCOPY (EGD) WITH PROPOFOL;  Surgeon: Malissa Hippo, MD;  [facility];  Service: Endoscopy;  Laterality: N/A;  Possible esophageal dilation.  ESOPHAGOGASTRODUODENOSCOPY (EGD) WITH PROPOFOL N/A 10/27/2020   Procedure: ESOPHAGOGASTRODUODENOSCOPY (EGD) WITH PROPOFOL;  Surgeon: Dolores Frame, MD;  [facility];  Service: Gastroenterology;  Laterality: N/A;  2:10, pt knows to arrive at 10:15   ESOPHAGOGASTRODUODENOSCOPY (EGD) WITH PROPOFOL N/A 02/28/2021   Procedure: ESOPHAGOGASTRODUODENOSCOPY (EGD) WITH PROPOFOL;  Surgeon: Malissa Hippo, MD;  [facility];  Service: Endoscopy;  Laterality: N/A;  200 ASA 1   ESOPHAGOGASTRODUODENOSCOPY (EGD)  WITH PROPOFOL N/A 03/29/2022   Procedure: ESOPHAGOGASTRODUODENOSCOPY (EGD) WITH PROPOFOL;  Surgeon: Dolores Frame, MD;  [facility];  Service: Gastroenterology;  Laterality: N/A;  2:00 pm, asa 1-2   GIVENS CAPSULE STUDY N/A 11/24/2020   Procedure: GIVENS CAPSULE STUDY;  Surgeon: Malissa Hippo, MD;  [facility];  Service: Endoscopy;  Laterality: N/A;  7:30   IR ANGIOGRAM SELECTIVE EACH ADDITIONAL VESSEL  08/01/2018   IR ANGIOGRAM VISCERAL SELECTIVE  08/01/2018   IR EMBO ART  VEN HEMORR LYMPH EXTRAV  INC GUIDE ROADMAPPING  08/01/2018   IR IMAGING GUIDED PORT INSERTION  08/20/2018   IR PERC PLEURAL DRAIN W/INDWELL CATH W/IMG GUIDE  07/08/2018   IR THORACENTESIS ASP PLEURAL SPACE W/IMG GUIDE  07/07/2018   IR US GUIDE VASC ACCESS RIGHT  08/01/2018   PLEURAL EFFUSION DRAINAGE Left 07/13/2018   Procedure: DRAINAGE OF LOCULATED PLEURAL EFFUSION;  Surgeon: Kerin Perna, MD;  Location: [facility] OR;  Service: Thoracic;  Laterality: Left;   POLYPECTOMY  11/15/2020   Procedure: POLYPECTOMY;  Surgeon: Malissa Hippo, MD;  [facility];  Service: Endoscopy;;   REMOVAL OF PLEURAL DRAINAGE CATHETER Left 08/20/2018   Procedure: REMOVAL OF PLEURAL DRAINAGE CATHETER;  Surgeon: Kerin Perna, MD;  Location[facility] OR;  Service: Thoracic;  Laterality: Left;   REMOVAL OF PLEURAL DRAINAGE CATHETER Left 08/20/2018   Procedure: REMOVAL OF PLEURAL DRAINAGE CATHETER;  Surgeon: Kerin Perna, MD;  Location: [facility] OR;  Service: Thoracic;  Laterality: Left;   TALC PLEURODESIS Left 07/13/2018   Procedure: Talc Pleuradesis;  Surgeon: Donata Clay, Theron Arista, MD;  Location: [facility] OR;  Service: Thoracic;  Laterality: Left;   TOTAL HIP ARTHROPLASTY Left 06/05/2020   Procedure: LEFT TOTAL HIP ARTHROPLASTY ANTERIOR APPROACH;  Surgeon: Tarry Kos, MD;  Location: [facility] OR;  Service: Orthopedics;  Laterality: Left;  3-C   TUBAL LIGATION Bilateral    UTERINE ABLATION     VIDEO ASSISTED THORACOSCOPY Left  07/13/2018   Procedure: VIDEO ASSISTED THORACOSCOPY;  Surgeon: Kerin Perna, MD;  Location[facility] OR;  Service: Thoracic;  Laterality: Left;    Allergies: Carboplatin, Morphine and codeine, Nickel, Nortriptyline, Topamax [topiramate], Xanax [alprazolam], Actifed cold-allergy [chlorpheniramine-phenyleph er], Amoxicillin, Codeine, Erythromycin, Gemzar [gemcitabine], Penicillins, Red dye, and Sudafed [pseudoephedrine hcl]  Medications: Prior to Admission medications   Medication Sig Start Date End Date Taking? Authorizing Provider  albuterol (VENTOLIN HFA) 108 (90 Base) MCG/ACT inhaler Inhale 2 puffs into the lungs every 6 (six) hours as needed for wheezing or shortness of breath.    [provider]  diazepam (VALIUM) 5 MG tablet Take 1 tablet (5 mg total) by mouth at bedtime. 09/05/22   Doreatha Massed, MD  dicyclomine (BENTYL) 20 MG tablet Take 1 tablet (20 mg total) by mouth every 6 (six) hours as needed for spasms. 01/07/23   Doreatha Massed, MD  feeding supplement (ENSURE ENLIVE / ENSURE PLUS) LIQD Take 237 mLs by mouth 3 (three) times daily between meals. 02/12/23   Vassie Loll, MD  HYDROmorphone (DILAUDID) 8 MG tablet Take 1 tablet (8 mg total) by mouth every 4 (four) hours as needed (Breakthrough Pain). 02/10/23   Doreatha Massed, MD  LORazepam (ATIVAN) 0.5 MG tablet Take 0.5 mg by mouth 3 (three) times daily as needed. 02/04/23   [provider]  magic mouthwash w/lidocaine SOLN Take 5 mLs by mouth 4 (four) times daily as needed for mouth pain. Suspension contains equal amounts of Maalox Extra Strength, nystatin, diphenhydramine and lidocaine. 01/16/23   Doreatha Massed, MD  metoCLOPramide (REGLAN) 10 MG tablet Take 1 tablet (10 mg total) by mouth every 8 (eight) hours as needed for refractory nausea / vomiting. 02/12/23 02/12/24  Vassie Loll, MD  naloxegol oxalate (MOVANTIK) 25 MG TABS tablet Take 1 tablet (25 mg total) by mouth daily. 02/12/23   Vassie Loll, MD  ondansetron (ZOFRAN) 4 MG tablet TAKE 1 TABLET BY MOUTH EVERY 8 HOURS AS NEEDED FOR NAUSEA AND VOMITING 02/17/23   Doreatha Massed, MD  ondansetron (ZOFRAN-ODT) 4 MG disintegrating tablet Take 1 tablet (4 mg total) by mouth every 8 (eight) hours as needed for nausea or vomiting. 01/22/23   Doreatha Massed, MD  oxyCODONE (OXYCONTIN) 60 MG 12 hr tablet Take 1 tablet by mouth every 12 (twelve) hours as needed. 01/20/23   Doreatha Massed, MD  pantoprazole (PROTONIX) 40 MG tablet Take 1 tablet (40 mg total) by mouth daily. 02/12/23 02/12/24  Vassie Loll, MD     Family History  Problem Relation Age of Onset   Depression Mother    Hypertension Mother    Obesity Mother    Diabetes Mother    Kidney disease Mother    Peripheral vascular disease Father    Atrial fibrillation Father    Crohn's disease Sister    Uterine cancer Sister 78       maternal half sister   COPD Brother    Osteoporosis Brother    Breast cancer Maternal Aunt 51   Colon cancer Neg Hx     Social History   Socioeconomic History   Marital status: Widowed    Spouse name: Not on file   Number of children: 2   Years of education: 2-College   Highest education level: Not on file  Occupational History    Employer: [employer]  Tobacco Use   Smoking status: Every Day    Current packs/day: 0.50    Average packs/day: 0.5 packs/day for 17.2 years (8.6 ttl pk-yrs)    Types: Cigarettes    Start date: 06/21/2001    Last attempt to quit: 06/22/2018   Smokeless tobacco: Never  Vaping Use   Vaping status: Never Used  Substance and Sexual Activity   Alcohol use: Not Currently    Alcohol/week: 1.0 standard drink of alcohol    Types: 1 Glasses of wine per week    Comment: occasionally   Drug use: No   Sexual activity: Not Currently  Other Topics Concern   Not on file  Social History Narrative   Patient lives at home with her daughter.    Patient has 2 children.    Patient is widowed.    Patient is right  handed.    Patient has her Associates degree.      Social Drivers of Corporate investment banker Strain: Low Risk  (08/26/2022)   Received from [facility]   Overall Financial Resource Strain (CARDIA)    Difficulty of Paying Living Expenses: Not hard at all  Food Insecurity: No Food Insecurity (  02/10/2023)   Hunger Vital Sign    Worried About Running Out of Food in the Last Year: Never true    Ran Out of Food in the Last Year: Never true  Transportation Needs: No Transportation Needs (02/10/2023)   PRAPARE - Administrator, Civil Service (Medical): No    Lack of Transportation (Non-Medical): No  Physical Activity: Inactive (07/22/2018)   Exercise Vital Sign    Days of Exercise per Week: 0 days    Minutes of Exercise per Session: 0 min  Stress: Stress Concern Present (07/22/2018)   [employer] of Occupational Health - Occupational Stress Questionnaire    Feeling of Stress : Very much  Social Connections: Moderately Isolated (07/22/2018)   Social Connection and Isolation Panel [NHANES]    Frequency of Communication with Friends and Family: More than three times a week    Frequency of Social Gatherings with Friends and Family: More than three times a week    Attends Religious Services: 1 to 4 times per year    Active Member of [employer] or Organizations: No    Attends Banker Meetings: Never    Marital Status: Widowed     Review of Systems: A 12 point ROS discussed and pertinent positives are indicated in the HPI above.  All other systems are negative.  Review of Systems  Constitutional:  Positive for appetite change and fatigue. Negative for fever.  Respiratory:  Negative for cough and shortness of breath.   Cardiovascular:  Negative for chest pain.  Gastrointestinal:  Positive for abdominal pain and blood in stool. Negative for nausea and vomiting.  Musculoskeletal:        (+) left leg pain/swelling   Neurological:  Negative for dizziness  and headaches.    Vital Signs: BP 108/67   Pulse (!) 132   Temp 98.1 F (36.7 C) (Oral)   Resp 16   LMP  (LMP Unknown)   SpO2 95%   Physical Exam Vitals reviewed.  Constitutional:      General: She is not in acute distress. HENT:     Head: Normocephalic.     Mouth/Throat:     Mouth: Mucous membranes are moist.     Pharynx: Oropharynx is clear. No oropharyngeal exudate or posterior oropharyngeal erythema.  Cardiovascular:     Rate and Rhythm: Regular rhythm. Tachycardia present.  Pulmonary:     Effort: Pulmonary effort is normal.     Breath sounds: Normal breath sounds.  Abdominal:     General: There is distension.     Palpations: Abdomen is soft.  Musculoskeletal:     Left lower leg: Edema (TTP behind knee) present.  Skin:    General: Skin is warm and dry.  Neurological:     Mental Status: She is alert and oriented to person, place, and time.  Psychiatric:        Mood and Affect: Mood normal.        Behavior: Behavior normal.        Thought Content: Thought content normal.        Judgment: Judgment normal.      MD Evaluation Airway: WNL Heart: WNL Abdomen: WNL Chest/ Lungs: WNL ASA  Classification: 3 Mallampati/Airway Score: Two   Imaging: US Venous Img Lower Unilateral Left Result Date: 03/07/2023 CLINICAL DATA:  Left calf swelling EXAM: LEFT LOWER EXTREMITY VENOUS DOPPLER ULTRASOUND TECHNIQUE: Gray-scale sonography with graded compression, as well as color Doppler and duplex ultrasound were performed to evaluate the lower  extremity deep venous systems from the level of the common femoral vein and including the common femoral, femoral, profunda femoral, popliteal and calf veins including the posterior tibial, peroneal and gastrocnemius veins when visible. The superficial great saphenous vein was also interrogated. Spectral Doppler was utilized to evaluate flow at rest and with distal augmentation maneuvers in the common femoral, femoral and popliteal veins.  COMPARISON:  None Available. FINDINGS: Contralateral Common Femoral Vein: Respiratory phasicity is normal and symmetric with the symptomatic side. No evidence of thrombus. Normal compressibility. Common Femoral Vein: No evidence of thrombus. Normal compressibility, respiratory phasicity and response to augmentation. Saphenofemoral Junction: No evidence of thrombus. Normal compressibility and flow on color Doppler imaging. Profunda Femoral Vein: No evidence of thrombus. Normal compressibility and flow on color Doppler imaging. Femoral Vein: Femoral vein is patent and unremarkable in the upper thigh. However, the vessel becomes noncompressible in the mid thigh. No evidence of color flow on color Doppler imaging. Findings are consistent with occlusive thrombus. Occlusive thrombus extends through the distal. Popliteal Vein: Occlusive thrombus extends into the popliteal in pain. Calf Veins: Occlusive thrombus extends into the calf veins. Superficial Great Saphenous Vein: No evidence of thrombus. Normal compressibility. Venous Reflux:  None. Other Findings:  None. IMPRESSION: Positive for acute occlusive DVT in the left lower extremity from the femoral vein in the mid thigh through the popliteal vein and into the proximal calf veins. Electronically Signed   By: Malachy Moan M.D.   On: 03/07/2023 13:30   CT ABDOMEN PELVIS W CONTRAST Result Date: 02/12/2023 CLINICAL DATA:  Stage IV ovarian cancer. New onset abdominal pain * Tracking Code: BO * EXAM: CT ABDOMEN AND PELVIS WITH CONTRAST TECHNIQUE: Multidetector CT imaging of the abdomen and pelvis was performed using the standard protocol following bolus administration of intravenous contrast. RADIATION DOSE REDUCTION: This exam was performed according to the departmental dose-optimization program which includes automated exposure control, adjustment of the mA and/or kV according to patient size and/or use of iterative reconstruction technique. CONTRAST:   OMNIPAQUE IOHEXOL 300 MG/ML  SOLN COMPARISON:  Ultrasound same day 02/12/2023. CT 02/09/2023 and older. FINDINGS: Lower chest: Lung bases are grossly clear.  No pleural effusion. Hepatobiliary: Extensive fatty liver infiltration. Previous cholecystectomy. Patent portal vein. Pancreas: Moderate atrophy of the pancreas. Spleen: Lobular spleen. Anterior to the spleen is a 7 cm complex lesion with cystic areas and potential calcification, unchanged from previous. Embolization coils near the splenic hilum. Adrenals/Urinary Tract: Adrenal glands are preserved. No enhancing renal mass or collecting system dilatation. Bladder is underdistended. Stomach/Bowel: Large bowel is normal course and caliber. There is some scattered stool. Surgical changes along the base of the cecum. Stomach is nondilated. Overall small bowel has areas of small bowel stool appearance diffusely. Some loops are mildly distended with diameter approaching 3.3 cm. Previously there were more air-fluid levels along the small bowel with more loops that are distended. Vascular/Lymphatic: Normal caliber aorta and IVC with scattered atherosclerotic calcifications. Scattered small nodes are again identified, unchanged from previous. Reproductive: Status post hysterectomy. No adnexal masses. Other: Anasarca. There is scattered ascites identified with diffuse areas of peritoneal and wall thickening along the course of the ascites. The level is similar to previous in the thickening is likely related to peritoneal spread of disease as per provided history. There is specific nodular areas of tissue as well seen adjacent to the distal stomach anteriorly, lesser curve and towards the porta hepatis. Again as on previous. Slight wall thickening along several loops of  bowel again seen which is likely as well spread of disease. Musculoskeletal: Degenerative changes of the spine and pelvis. Streak artifact related to the patient's left hip arthroplasty. IMPRESSION: Again  note is made of ascites with extensive nodular wall thickening along the course of the ascites, perineum peritoneum and along the surface of bowel loops consistent with known metastatic disease of ovarian cancer, peritoneal carcinomatosis. The previous dilated loops of small bowel are decreasing today in size. Less air-fluid levels but more small bowel stool appearance. No free air. Stable numerous other findings in the short time interval. Electronically Signed   By: Karen Kays M.D.   On: 02/12/2023 13:08   [place] ASCITES (ABDOMEN LIMITED) Result Date: 02/12/2023 CLINICAL DATA:  Ovarian CA, peritoneal carcinomatosis, concern for recurrent large volume of ascites, abdominal discomfort EXAM: LIMITED ABDOMEN ULTRASOUND FOR ASCITES TECHNIQUE: Limited ultrasound survey for ascites was performed in all four abdominal quadrants. COMPARISON:  02/12/2023 CT FINDINGS: Survey of the abdominal 4 quadrants performed. Only a small volume of scattered abdominopelvic ascites some of which is septated and complex. No large volume of ascites demonstrated by ultrasound. Therapeutic paracentesis not performed. IMPRESSION: Small volume of abdominopelvic ascites. Electronically Signed   By: Judie Petit.  Shick M.D.   On: 02/12/2023 10:34   CT ABDOMEN PELVIS W CONTRAST Result Date: 02/09/2023 CLINICAL DATA:  Abdominal pain, nausea, and vomiting x2 months. Patient has ovarian cancer with metastases. Ultrasound therapeutic paracentesis x3 this month, last on 01/22/2023 with 600 mL clear yellow fluid removed. EXAM: CT ABDOMEN AND PELVIS WITH CONTRAST TECHNIQUE: Multidetector CT imaging of the abdomen and pelvis was performed using the standard protocol following bolus administration of intravenous contrast. RADIATION DOSE REDUCTION: This exam was performed according to the departmental dose-optimization program which includes automated exposure control, adjustment of the mA and/or kV according to patient size and/or use of iterative  reconstruction technique. CONTRAST:  OMNIPAQUE IOHEXOL 300 MG/ML  SOLN COMPARISON:  Numerous prior CTs. The 2 most recent are a CT abdomen pelvis with contrast 12/24/2022 and CTA chest, abdomen and pelvis from 12/15/2022. FINDINGS: Lower chest: An infusion catheter again noted terminating about the superior cavoatrial junction. The cardiac size is normal. There is a small hiatal hernia. Mild elevation right hemidiaphragm with lung bases clear. Hepatobiliary: Moderate to severe hepatic steatosis is increased in the interval. There is no mass enhancement. Gallbladder is absent as before with stable prominent common bile duct measuring 11 mm. Pancreas: Partially atrophic. No focal abnormality. No inflammation. Spleen: Stable 7 cm splenic cystic lesion with patchy calcifications. Lobulated appearance of the spleen. No splenomegaly. Embolization coils again noted at the splenic hilum. No new abnormality. Adrenals/Urinary Tract: Adrenal glands are unremarkable. Kidneys are normal, without renal calculi, focal lesion, or hydronephrosis. Bladder is unremarkable. Stomach/Bowel: Chronic thickened folds in the upper to mid stomach. Mild increase parent fluid distention. Increased dilatation in duodenum and proximal to mid small bowel up to 4.4 cm caliber. There are thickened folds in multiple dilated and nondilated segments. There is a thickened segment in the central abdomen best seen on 2: 34-37 which is believed to be the transitional segment causing intermediate grade obstruction. Decompressed segments are present downstream. This is probably mid ileal in location. An appendix is not seen. There is mild fecal stasis ascending and transverse colon. Rest of the large bowel is largely contracted. Vascular/Lymphatic: Aortic atherosclerosis. No enlarged abdominal or pelvic lymph nodes. Reproductive: Status post hysterectomy. No adnexal masses. Multiple pelvic phleboliths. Other: Mild-to-moderate free fluid, less than on  12/24/2022. There is enhancement of the peritoneal reflections consistent with peritoneal carcinomatosis, ranging from linear to nodular enhancement as before. No overt progression in this is seen. There are minimal layering presacral ascites as well. There is no free hemorrhage or free air.  No incarcerated hernia. Musculoskeletal: Left hip replacement. Grade 1 degenerative anterolisthesis L4-5 with advanced facet hypertrophy and acquired spinal stenosis. Osteopenia.  No regional bone metastasis is seen. IMPRESSION: 1. Intermediate grade small-bowel obstruction with transitional segment in the central abdomen, probably mid ileal in location, due to a thickened segment. No pneumatosis. 2. Mild-to-moderate ascites, less than on 12/24/2022. 3. Peritoneal carcinomatosis findings without overt progression. 4. Increased moderate to severe hepatic steatosis. 5. Stable 7 cm splenic cystic lesion with patchy calcifications. 6. Aortic atherosclerosis. 7. Small hiatal hernia. 8. Osteopenia and degenerative change. Aortic Atherosclerosis (ICD10-I70.0). Electronically Signed   By: Almira Bar M.D.   On: 02/09/2023 23:03   DG Chest Portable 1 View Result Date: 02/09/2023 CLINICAL DATA:  Shortness of breath with abdominal pain. EXAM: PORTABLE CHEST 1 VIEW COMPARISON:  Chest x-ray 11/12/2021 FINDINGS: Right chest port catheter tip projects over the distal SVC. The heart size and mediastinal contours are within normal limits. Both lungs are clear. The visualized skeletal structures are unremarkable. IMPRESSION: No active disease. Electronically Signed   By: Darliss Cheney M.D.   On: 02/09/2023 21:58    Labs:  CBC: Recent Labs    02/11/23 0433 02/18/23 0901 02/27/23 1159 03/07/23 1301  WBC 8.0 12.0* 6.7 5.8  HGB 9.0* 10.8* 9.9* 9.6*  HCT 27.6* 33.9* 30.8* 29.3*  PLT 208 218 86* 63*    COAGS: No results for input(s): "INR", "APTT" in the last 8760 hours.  BMP: Recent Labs    02/12/23 0435 02/18/23 0901  02/27/23 1159 03/07/23 1301  NA 134* 135 136 138  K 3.8 4.3 3.8 3.2*  CL 98 95* 98 100  CO2 26 28 27 26   GLUCOSE 77 81 87 108*  BUN 10 15 10 10   CALCIUM 7.6* 8.3* 8.7* 8.5*  CREATININE 0.76 0.78 0.69 0.62  GFRNONAA >60 >60 >60 >60    LIVER FUNCTION TESTS: Recent Labs    02/11/23 0433 02/12/23 0435 02/18/23 0901 02/27/23 1159 03/07/23 1301  BILITOT 1.5*  --  1.4* 1.0 1.1  AST 48*  --  65* 85* 68*  ALT 43  --  46* 89* 65*  ALKPHOS 549*  --  894* 744* 401*  PROT 5.2*  --  6.3* 6.7 6.1*  ALBUMIN 2.0* 1.9* 2.3* 2.4* 2.4*    TUMOR MARKERS: No results for input(s): "AFPTM", "CEA", "CA199", "CHROMGRNA" in the last 8760 hours.  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 She was noted to have (+) FOBT and drop in plts, hgb remains at baseline. [place] LLE showed acute occlusive DVT from the femoral vein in the mid thigh through the popliteal vein and into the proximal calf veins. She has not been on anticoagulation. Due to current GI bleed, IR has been consulted for IVC filter placement.  Patient history and imaging reviewed by Dr. Lowella Dandy who approves procedure. Patient will need to be transferred to [facility] for procedure which is tentatively planned for AM 2/22 pending this transfer. She is aware she may return to [facility] after the procedure or stay at [facility] for the remainder of  her hospital stay depending on outcome of ongoing discussions with care team. She would like to be sedated for the procedure.  Creatinine 0.62, BUN 10 on today's labs.   Plan: - NPO at midnight on 2/22 for sedation, sips with meds - CBC/INR AM 2/22 - Patient to transfer to [facility] via [facility] for procedure, TBD at this time if she will remain at [facility] or return to [facility] post procedure. Ongoing discussions with primary care team at time of this note writing.  Risks and  benefits discussed with the patient including, but not limited to bleeding, infection, contrast induced renal failure, filter fracture or migration which can lead to emergency surgery or even death, strut penetration with damage or irritation to adjacent structures and caval thrombosis.  All of the patient's questions were answered, patient is agreeable to proceed.  Consent signed.  Thank you for this interesting consult.  I greatly enjoyed meeting DOMINIKA LOSEY and look forward to participating in their care.  A copy of this report was sent to the requesting provider on this date.  Electronically Signed: Villa Herb, PA-C 03/07/2023, 3:24 PM   I spent a total of 40 Minutes in face to face in clinical consultation, greater than 50% of which was counseling/coordinating care for lower extremity DVT.

## 2023-03-07 NOTE — ED Notes (Signed)
[facility] called to transport patient

## 2023-03-07 NOTE — ED Triage Notes (Addendum)
Pt reports bright red stool. Pt also reporting pain in her left calf and behind her knee. Denies blood thinner use. Pt has stage 4 ovarian cancer.

## 2023-03-08 ENCOUNTER — Inpatient Hospital Stay (HOSPITAL_COMMUNITY): Payer: 59

## 2023-03-08 DIAGNOSIS — K921 Melena: Secondary | ICD-10-CM | POA: Diagnosis not present

## 2023-03-08 DIAGNOSIS — I82402 Acute embolism and thrombosis of unspecified deep veins of left lower extremity: Secondary | ICD-10-CM | POA: Insufficient documentation

## 2023-03-08 DIAGNOSIS — D509 Iron deficiency anemia, unspecified: Secondary | ICD-10-CM

## 2023-03-08 DIAGNOSIS — K922 Gastrointestinal hemorrhage, unspecified: Secondary | ICD-10-CM | POA: Diagnosis not present

## 2023-03-08 DIAGNOSIS — R197 Diarrhea, unspecified: Secondary | ICD-10-CM

## 2023-03-08 HISTORY — PX: IR IVC FILTER PLMT / S&I /IMG GUID/MOD SED: IMG701

## 2023-03-08 LAB — GASTROINTESTINAL PANEL BY PCR, STOOL (REPLACES STOOL CULTURE)

## 2023-03-08 LAB — CBC WITH DIFFERENTIAL/PLATELET
Abs Immature Granulocytes: 0.05 10*3/uL (ref 0.00–0.07)
Basophils Absolute: 0 10*3/uL (ref 0.0–0.1)
Basophils Relative: 1 %
Eosinophils Absolute: 0.1 10*3/uL (ref 0.0–0.5)
Eosinophils Relative: 1 %
HCT: 24.7 % — ABNORMAL LOW (ref 36.0–46.0)
Hemoglobin: 8.1 g/dL — ABNORMAL LOW (ref 12.0–15.0)
Immature Granulocytes: 1 %
Lymphocytes Relative: 37 %
Lymphs Abs: 1.6 10*3/uL (ref 0.7–4.0)
MCH: 40.9 pg — ABNORMAL HIGH (ref 26.0–34.0)
MCHC: 32.8 g/dL (ref 30.0–36.0)
MCV: 124.7 fL — ABNORMAL HIGH (ref 80.0–100.0)
Monocytes Absolute: 0.7 10*3/uL (ref 0.1–1.0)
Monocytes Relative: 16 %
Neutro Abs: 2 10*3/uL (ref 1.7–7.7)
Neutrophils Relative %: 44 %
Platelets: 67 10*3/uL — ABNORMAL LOW (ref 150–400)
RBC: 1.98 MIL/uL — ABNORMAL LOW (ref 3.87–5.11)
RDW: 21 % — ABNORMAL HIGH (ref 11.5–15.5)
Smear Review: DECREASED
WBC: 4.4 10*3/uL (ref 4.0–10.5)
nRBC: 1.4 % — ABNORMAL HIGH (ref 0.0–0.2)

## 2023-03-08 LAB — BASIC METABOLIC PANEL
Anion gap: 11 (ref 5–15)
BUN: 9 mg/dL (ref 6–20)
CO2: 27 mmol/L (ref 22–32)
Calcium: 8.1 mg/dL — ABNORMAL LOW (ref 8.9–10.3)
Chloride: 99 mmol/L (ref 98–111)
Creatinine, Ser: 0.58 mg/dL (ref 0.44–1.00)
GFR, Estimated: >60 mL/min (ref >60)
Glucose, Bld: 78 mg/dL (ref 70–99)
Potassium: 3.8 mmol/L (ref 3.5–5.1)
Sodium: 137 mmol/L (ref 135–145)

## 2023-03-08 LAB — COMPREHENSIVE METABOLIC PANEL
ALT: 50 U/L — ABNORMAL HIGH (ref 0–44)
AST: 48 U/L — ABNORMAL HIGH (ref 15–41)
Albumin: 1.9 g/dL — ABNORMAL LOW (ref 3.5–5.0)
Alkaline Phosphatase: 303 U/L — ABNORMAL HIGH (ref 38–126)
Anion gap: 10 (ref 5–15)
BUN: 8 mg/dL (ref 6–20)
CO2: 27 mmol/L (ref 22–32)
Calcium: 8.2 mg/dL — ABNORMAL LOW (ref 8.9–10.3)
Chloride: 101 mmol/L (ref 98–111)
Creatinine, Ser: 0.49 mg/dL (ref 0.44–1.00)
GFR, Estimated: >60 mL/min (ref >60)
Glucose, Bld: 77 mg/dL (ref 70–99)
Potassium: 3.8 mmol/L (ref 3.5–5.1)
Sodium: 138 mmol/L (ref 135–145)
Total Bilirubin: 0.8 mg/dL (ref 0.0–1.2)
Total Protein: 5 g/dL — ABNORMAL LOW (ref 6.5–8.1)

## 2023-03-08 LAB — MAGNESIUM
Magnesium: 1.3 mg/dL — ABNORMAL LOW (ref 1.7–2.4)
Magnesium: 1.3 mg/dL — ABNORMAL LOW (ref 1.7–2.4)

## 2023-03-08 LAB — PREPARE RBC (CROSSMATCH)

## 2023-03-08 LAB — HEMOGLOBIN AND HEMATOCRIT, BLOOD
HCT: 19.8 % — ABNORMAL LOW (ref 36.0–46.0)
HCT: 27.2 % — ABNORMAL LOW (ref 36.0–46.0)
HCT: 29.7 % — ABNORMAL LOW (ref 36.0–46.0)
Hemoglobin: 6.4 g/dL — CL (ref 12.0–15.0)
Hemoglobin: 8.9 g/dL — ABNORMAL LOW (ref 12.0–15.0)
Hemoglobin: 9.9 g/dL — ABNORMAL LOW (ref 12.0–15.0)

## 2023-03-08 LAB — APTT: aPTT: 36 s (ref 24–36)

## 2023-03-08 LAB — C DIFFICILE QUICK SCREEN W PCR REFLEX
C Diff antigen: NEGATIVE
C Diff interpretation: NOT DETECTED
C Diff toxin: NEGATIVE

## 2023-03-08 LAB — PROTIME-INR
INR: 1.2 (ref 0.8–1.2)
INR: 1.1 (ref 0.8–1.2)
Prothrombin Time: 15.1 s (ref 11.4–15.2)
Prothrombin Time: 14.8 s (ref 11.4–15.2)

## 2023-03-08 LAB — PHOSPHORUS: Phosphorus: 3.4 mg/dL (ref 2.5–4.6)

## 2023-03-08 MED ORDER — NA SULFATE-K SULFATE-MG SULF 17.5-3.13-1.6 GM/177ML PO SOLN
0.5000 | Freq: Once | ORAL | Status: AC
  Administered 2023-03-08: 177 mL via ORAL
  Filled 2023-03-08: qty 1

## 2023-03-08 MED ORDER — SODIUM CHLORIDE 0.9% IV SOLUTION: Freq: Once | INTRAVENOUS | Status: AC

## 2023-03-08 MED ORDER — PANTOPRAZOLE SODIUM 40 MG IV SOLR
40.0000 mg | Freq: Two times a day (BID) | INTRAVENOUS | Status: DC
  Administered 2023-03-08 – 2023-03-09 (×3): 40 mg via INTRAVENOUS
  Filled 2023-03-08 (×3): qty 10

## 2023-03-08 MED ORDER — SODIUM CHLORIDE 0.9 % IV SOLN: INTRAVENOUS | Status: AC

## 2023-03-08 MED ORDER — MIDAZOLAM HCL 2 MG/2ML IJ SOLN
INTRAMUSCULAR | Status: AC | PRN
  Administered 2023-03-08: 1 mg via INTRAVENOUS

## 2023-03-08 MED ORDER — SODIUM CHLORIDE 0.9% FLUSH
3.0000 mL | Freq: Two times a day (BID) | INTRAVENOUS | Status: DC
  Administered 2023-03-08 – 2023-03-10 (×5): 10 mL via INTRAVENOUS

## 2023-03-08 MED ORDER — FENTANYL CITRATE (PF) 100 MCG/2ML IJ SOLN
INTRAMUSCULAR | Status: AC
  Filled 2023-03-08: qty 2

## 2023-03-08 MED ORDER — FENTANYL CITRATE (PF) 100 MCG/2ML IJ SOLN
INTRAMUSCULAR | Status: AC | PRN
  Administered 2023-03-08: 25 ug via INTRAVENOUS
  Administered 2023-03-08: 50 ug via INTRAVENOUS
  Administered 2023-03-08: 25 ug via INTRAVENOUS

## 2023-03-08 MED ORDER — SODIUM CHLORIDE 0.9 % IV SOLN
INTRAVENOUS | Status: AC | PRN
  Administered 2023-03-08: 10 mL/h via INTRAVENOUS

## 2023-03-08 MED ORDER — IOHEXOL 350 MG/ML SOLN
75.0000 mL | Freq: Once | INTRAVENOUS | Status: AC | PRN
  Administered 2023-03-08: 75 mL via INTRAVENOUS

## 2023-03-08 MED ORDER — HYDROMORPHONE HCL 2 MG PO TABS
8.0000 mg | ORAL_TABLET | ORAL | Status: DC | PRN
  Administered 2023-03-08 – 2023-03-10 (×12): 8 mg via ORAL
  Filled 2023-03-08 (×11): qty 4

## 2023-03-08 MED ORDER — IOHEXOL 300 MG/ML  SOLN
50.0000 mL | Freq: Once | INTRAMUSCULAR | Status: AC | PRN
  Administered 2023-03-08: 40 mL via INTRAVENOUS

## 2023-03-08 MED ORDER — LIDOCAINE HCL 1 % IJ SOLN
20.0000 mL | Freq: Once | INTRAMUSCULAR | Status: AC
  Administered 2023-03-08: 10 mL

## 2023-03-08 MED ORDER — LORAZEPAM 1 MG PO TABS: 0.5000 mg | ORAL_TABLET | Freq: Three times a day (TID) | ORAL | Status: DC | PRN

## 2023-03-08 MED ORDER — ONDANSETRON 4 MG PO TBDP
4.0000 mg | ORAL_TABLET | Freq: Three times a day (TID) | ORAL | Status: DC | PRN
  Administered 2023-03-08 – 2023-03-10 (×5): 4 mg via ORAL
  Filled 2023-03-08 (×6): qty 1

## 2023-03-08 MED ORDER — LIDOCAINE HCL 1 % IJ SOLN
INTRAMUSCULAR | Status: AC
  Filled 2023-03-08: qty 20

## 2023-03-08 MED ORDER — SODIUM CHLORIDE 0.9 % IV SOLN: INTRAVENOUS | Status: DC

## 2023-03-08 MED ORDER — NA SULFATE-K SULFATE-MG SULF 17.5-3.13-1.6 GM/177ML PO SOLN
0.5000 | Freq: Once | ORAL | Status: AC
  Administered 2023-03-09: 177 mL via ORAL

## 2023-03-08 MED ORDER — SODIUM CHLORIDE 0.9% FLUSH: 3.0000 mL | INTRAVENOUS | Status: DC | PRN

## 2023-03-08 MED ORDER — MAGNESIUM SULFATE 2 GM/50ML IV SOLN
2.0000 g | Freq: Once | INTRAVENOUS | Status: AC
Start: 2023-03-08 — End: 2023-03-08
  Administered 2023-03-08: 2 g via INTRAVENOUS
  Filled 2023-03-08: qty 50

## 2023-03-08 MED ORDER — MIDAZOLAM HCL 2 MG/2ML IJ SOLN
INTRAMUSCULAR | Status: AC
  Filled 2023-03-08: qty 2

## 2023-03-08 NOTE — H&P (View-Only) (Signed)
 Referring Provider: Dr. Stasia Cavalier Primary Care Physician:  Ignatius Specking, MD Primary Gastroenterologist:  Dr. Theo Dills  Reason for Consultation: GI bleed, anemia  HPI: Michelle Holt is a 53 y.o. female with a past medical history of arthritis, anxiety, depression, kidney stones, hyperlipidemia, migraine headaches, GERD, 8 cm hiatal hernia with Sheria Lang ulcers s/p robot-assisted laparoscopy/repair of paraesophageal hernia with fundoplasty 08/22/2022, SBO 01/2023, colon polyps, spleen laceration s/p coil embolization 2020, recurrent stage IV ovarian cancer with peritoneal and pleural implants malignant ascites s/p paracentesis x 4 followed by oncologist Dr. Ellin Saba. See surgical history below.  She presented to [facility] ED 03/07/2023 with generalized abdominal pain for the past few months and bloody diarrhea for the past week. She also noted having left leg pain and swelling for the past few days. was not required.  LLE Doppler was positive for DVT (femoral vein in the mid thigh through the popliteal vein and into the proximal calf veins) and she was transferred to [facility] for IVC filter.  A GI consult was requested for further evaluation regarding GI bleed.  Labs in the ED showed a WBC count of 5.8.  Hemoglobin 9.6 (Hg 9.9 on 02/27/2023).  Repeat hemoglobin 8.9.  Hematocrit 29.3.  Platelets 63.  K3.2.  BUN 10.  Creatinine 0.62.  Total bili 1.1.  Alk phos 4 1.  AST 68.  ALT 65.  FOBT positive.  Labs today: WBC 4.4.  Hemoglobin 8.1.  Platelets 67.  INR pending.  GI consult was requested for further evaluation regarding hematochezia.  She describes having intermittent dark red blood with loose bowel movements for 3 weeks which started 1 month ago and for the past week she passed a moderate amount of dark red blood with clots every time she passed a bowel movement.  She passed several moderate to large volume episodes of dark hematochezia with clots overnight with loose stool and  2 episodes of small-volume dark red blood this morning. She infrequently sees bright red blood on the toilet tissue which she attributes to having hemorrhoids.  Not on blood thinners.  No NSAID use.  She has a variable abdominal pain.  She has chronic LUQ pain since she was diagnosed with a splenic laceration s/p coil analyzation in 2020.  She describes having intermittent lower abdominal pain for the past 4 months and over the past week has experienced some upper abdominal pain.  No nausea.  She endorses having intermittent acid reflux and dysphagia for the past few weeks.  She describes food such as hamburger gets stuck to the throat/upper esophagus, she gags and vomits up the stuck food.  On Pantoprazole 40 mg daily at home.  Her most recent EGD was 03/29/2022 which showed an 8 cm hiatal hernia with a few Cameron ulcers without active bleeding.  EGD 02/28/2021 showed a nonobstructing Schatzki's ring which was dilated.  Her most recent colonoscopy was 11/15/2020 which identified 2 tubular adenomatous polyps removed from the colon and external hemorrhoids.  Prior CTAP 02/12/2023 showed a normal bowel without evidence of diverticulosis.  Prior CTAP with contrast 02/12/2023:  FINDINGS: Lower chest: Lung bases are grossly clear.  No pleural effusion.   Hepatobiliary: Extensive fatty liver infiltration. Previous cholecystectomy. Patent portal vein.   Pancreas: Moderate atrophy of the pancreas.   Spleen: Lobular spleen. Anterior to the spleen is a 7 cm complex lesion with cystic areas and potential calcification, unchanged from previous. Embolization coils near the splenic hilum.   Adrenals/Urinary Tract: Adrenal  glands are preserved. No enhancing renal mass or collecting system dilatation. Bladder is underdistended.   Stomach/Bowel: Large bowel is normal course and caliber. There is some scattered stool. Surgical changes along the base of the cecum. Stomach is nondilated. Overall small bowel has areas  of small bowel stool appearance diffusely. Some loops are mildly distended with diameter approaching 3.3 cm. Previously there were more air-fluid levels along the small bowel with more loops that are distended.   Vascular/Lymphatic: Normal caliber aorta and IVC with scattered atherosclerotic calcifications. Scattered small nodes are again identified, unchanged from previous.   Reproductive: Status post hysterectomy. No adnexal masses.   Other: Anasarca. There is scattered ascites identified with diffuse areas of peritoneal and wall thickening along the course of the ascites. The level is similar to previous in the thickening is likely related to peritoneal spread of disease as per provided history. There is specific nodular areas of tissue as well seen adjacent to the distal stomach anteriorly, lesser curve and towards the porta hepatis. Again as on previous. Slight wall thickening along several loops of bowel again seen which is likely as well spread of disease.   Musculoskeletal: Degenerative changes of the spine and pelvis. Streak artifact related to the patient's left hip arthroplasty.   IMPRESSION: Again note is made of ascites with extensive nodular wall thickening along the course of the ascites, perineum peritoneum and along the surface of bowel loops consistent with known metastatic disease of ovarian cancer, peritoneal carcinomatosis.   The previous dilated loops of small bowel are decreasing today in size. Less air-fluid levels but more small bowel stool appearance.   No free air.   Stable numerous other findings in the short time interval.   Paracentesis:  01/22/2023, 01/17/2023, 01/15/2023 and 12/25/2022   GI PROCEDURES:  EGD 03/29/2022: - 8 cm hiatal hernia with a few Cameron ulcers.  - Normal examined duodenum.  - No specimens collected.  EGD 02/28/2021: - Normal hypopharynx.  - Normal esophagus.  - Non-obstructing Schatzki ring. Dilated.  - 8 cm hiatal  hernia. - Normal stomach.  - Normal duodenal bulb and second portion of the duodenum.  - No specimens collected.  Colonoscopy 11/15/2020: - The examined portion of the ileum was normal.  - Two 3 to 5 mm tubular adenomatous polyps in the ascending colon and in the cecum, removed with a cold snare. Resected and retrieved.  - External hemorrhoids.  - Anal papilla(e) were hypertrophied. Impression:  Past Medical History:  Diagnosis Date   Anemia    Anxiety and depression    Arthritis of facet joints at multiple vertebral levels    L5-S1   Constipation    Dyslipidemia    Family history of breast cancer    Family history of uterine cancer    GERD (gastroesophageal reflux disease)    History of hiatal hernia    History of kidney stones    Insomnia    Irritable bowel syndrome    Migraine    Muscle tension headache    Neuropathy of finger    Ovarian carcinoma (HCC)    ovarian   Plantar fasciitis of right foot    Port-A-Cath in place 08/20/2018    Past Surgical History:  Procedure Laterality Date   ABDOMINAL HYSTERECTOMY     BIOPSY  10/27/2020   Procedure: BIOPSY;  Surgeon: Dolores Frame, MD;  [facility];  Service: Gastroenterology;;   CHOLECYSTECTOMY  2008   COLONOSCOPY N/A 08/13/2013   Procedure: COLONOSCOPY;  Surgeon: Malissa Hippo, MD;  [facility];  Service: Endoscopy;  Laterality: N/A;  230-moved to 145 Ann to notify pt   COLONOSCOPY WITH PROPOFOL N/A 11/15/2020   Procedure: COLONOSCOPY WITH PROPOFOL;  Surgeon: Malissa Hippo, MD;  [facility];  Service: Endoscopy;  Laterality: N/A;  1:40   ESOPHAGEAL DILATION N/A 02/28/2021   Procedure: ESOPHAGEAL DILATION;  Surgeon: Malissa Hippo, MD;  [facility];  Service: Endoscopy;  Laterality: N/A;   ESOPHAGOGASTRODUODENOSCOPY     ESOPHAGOGASTRODUODENOSCOPY (EGD) WITH PROPOFOL N/A 07/20/2018   Procedure: ESOPHAGOGASTRODUODENOSCOPY (EGD) WITH PROPOFOL;  Surgeon: Malissa Hippo, MD;  [facility];  Service: Endoscopy;  Laterality: N/A;  Possible esophageal dilation.   ESOPHAGOGASTRODUODENOSCOPY (EGD) WITH PROPOFOL N/A 10/27/2020   Procedure: ESOPHAGOGASTRODUODENOSCOPY (EGD) WITH PROPOFOL;  Surgeon: Dolores Frame, MD;  [facility];  Service: Gastroenterology;  Laterality: N/A;  2:10, pt knows to arrive at 10:15   ESOPHAGOGASTRODUODENOSCOPY (EGD) WITH PROPOFOL N/A 02/28/2021   Procedure: ESOPHAGOGASTRODUODENOSCOPY (EGD) WITH PROPOFOL;  Surgeon: Malissa Hippo, MD;  [facility];  Service: Endoscopy;  Laterality: N/A;  200 ASA 1   ESOPHAGOGASTRODUODENOSCOPY (EGD) WITH PROPOFOL N/A 03/29/2022   Procedure: ESOPHAGOGASTRODUODENOSCOPY (EGD) WITH PROPOFOL;  Surgeon: Dolores Frame, MD;  [facility];  Service: Gastroenterology;  Laterality: N/A;  2:00 pm, asa 1-2   GIVENS CAPSULE STUDY N/A 11/24/2020   Procedure: GIVENS CAPSULE STUDY;  Surgeon: Malissa Hippo, MD;  [facility];  Service: Endoscopy;  Laterality: N/A;  7:30   IR ANGIOGRAM SELECTIVE EACH ADDITIONAL VESSEL  08/01/2018   IR ANGIOGRAM VISCERAL SELECTIVE  08/01/2018   IR EMBO ART  VEN HEMORR LYMPH EXTRAV  INC GUIDE ROADMAPPING  08/01/2018   IR IMAGING GUIDED PORT INSERTION  08/20/2018   IR PERC PLEURAL DRAIN W/INDWELL CATH W/IMG GUIDE  07/08/2018   IR THORACENTESIS ASP PLEURAL SPACE W/IMG GUIDE  07/07/2018   IR US GUIDE VASC ACCESS RIGHT  08/01/2018   PLEURAL EFFUSION DRAINAGE Left 07/13/2018   Procedure: DRAINAGE OF LOCULATED PLEURAL EFFUSION;  Surgeon: Kerin Perna, MD;  Location: [facility] OR;  Service: Thoracic;  Laterality: Left;   POLYPECTOMY  11/15/2020   Procedure: POLYPECTOMY;  Surgeon: Malissa Hippo, MD;  [facility];  Service: Endoscopy;;   REMOVAL OF PLEURAL DRAINAGE CATHETER Left 08/20/2018   Procedure: REMOVAL OF PLEURAL DRAINAGE CATHETER;  Surgeon: Kerin Perna, MD;  Location: [facility] OR;  Service: Thoracic;   Laterality: Left;   REMOVAL OF PLEURAL DRAINAGE CATHETER Left 08/20/2018   Procedure: REMOVAL OF PLEURAL DRAINAGE CATHETER;  Surgeon: Kerin Perna, MD;  Location: [facility] OR;  Service: Thoracic;  Laterality: Left;   TALC PLEURODESIS Left 07/13/2018   Procedure: Talc Pleuradesis;  Surgeon: Donata Clay, Theron Arista, MD;  Location: [facility] OR;  Service: Thoracic;  Laterality: Left;   TOTAL HIP ARTHROPLASTY Left 06/05/2020   Procedure: LEFT TOTAL HIP ARTHROPLASTY ANTERIOR APPROACH;  Surgeon: Tarry Kos, MD;  Location: [facility] OR;  Service: Orthopedics;  Laterality: Left;  3-C   TUBAL LIGATION Bilateral    UTERINE ABLATION     VIDEO ASSISTED THORACOSCOPY Left 07/13/2018   Procedure: VIDEO ASSISTED THORACOSCOPY;  Surgeon: Kerin Perna, MD;  Location: [facility] OR;  Service: Thoracic;  Laterality: Left;    Prior to Admission medications   Medication Sig Start Date End Date Taking? Authorizing Provider  albuterol (VENTOLIN HFA) 108 (90 Base) MCG/ACT inhaler Inhale 2 puffs  into the lungs every 6 (six) hours as needed for wheezing or shortness of breath.   Yes [provider]  diazepam (VALIUM) 5 MG tablet Take 1 tablet (5 mg total) by mouth at bedtime. 09/05/22  Yes Doreatha Massed, MD  dicyclomine (BENTYL) 20 MG tablet Take 1 tablet (20 mg total) by mouth every 6 (six) hours as needed for spasms. 01/07/23  Yes Doreatha Massed, MD  HYDROmorphone (DILAUDID) 8 MG tablet Take 1 tablet (8 mg total) by mouth every 4 (four) hours as needed ([facility]). 02/10/23  Yes Doreatha Massed, MD  LORazepam (ATIVAN) 0.5 MG tablet Take 0.5 mg by mouth 3 (three) times daily as needed for anxiety. 02/04/23  Yes [provider]  ondansetron (ZOFRAN) 4 MG tablet TAKE 1 TABLET BY MOUTH EVERY 8 HOURS AS NEEDED FOR NAUSEA AND VOMITING 02/17/23  Yes Doreatha Massed, MD  ondansetron (ZOFRAN-ODT) 4 MG disintegrating tablet Take 1 tablet (4 mg total) by mouth every 8 (eight) hours as needed for nausea or vomiting.  01/22/23  Yes Doreatha Massed, MD  pantoprazole (PROTONIX) 40 MG tablet Take 1 tablet (40 mg total) by mouth daily. 02/12/23 02/12/24 Yes Vassie Loll, MD  magic mouthwash w/lidocaine SOLN Take 5 mLs by mouth 4 (four) times daily as needed for mouth pain. Suspension contains equal amounts of Maalox Extra Strength, nystatin, diphenhydramine and lidocaine. Patient not taking: Reported on 03/07/2023 01/16/23   Doreatha Massed, MD  metoCLOPramide (REGLAN) 10 MG tablet Take 1 tablet (10 mg total) by mouth every 8 (eight) hours as needed for refractory nausea / vomiting. Patient not taking: Reported on 03/07/2023 02/12/23 02/12/24  Vassie Loll, MD  naloxegol oxalate (MOVANTIK) 25 MG TABS tablet Take 1 tablet (25 mg total) by mouth daily. Patient not taking: Reported on 03/07/2023 02/12/23   Vassie Loll, MD  oxyCODONE (OXYCONTIN) 60 MG 12 hr tablet Take 1 tablet by mouth every 12 (twelve) hours as needed. Patient not taking: Reported on 03/07/2023 01/20/23   Doreatha Massed, MD    Current Facility-Administered Medications  Medication Dose Route Frequency Provider Last Rate Last Admin   diazepam (VALIUM) tablet 5 mg  5 mg Oral QHS Crosley, Debby, MD   5 mg at 03/07/23 2327   HYDROmorphone (DILAUDID) injection 2 mg  2 mg Intravenous Q3H PRN Gery Pray, MD   2 mg at 03/08/23 0640   ondansetron (ZOFRAN-ODT) disintegrating tablet 4 mg  4 mg Oral Q8H PRN Dezii, Gordy Councilman, DO   4 mg at 03/08/23 0238   pantoprazole (PROTONIX) injection 40 mg  40 mg Intravenous Q12H Noralee Stain, DO       sodium chloride flush (NS) 0.9 % injection 3-10 mL  3-10 mL Intravenous Q12H Noralee Stain, DO   10 mL at 03/08/23 0755   sodium chloride flush (NS) 0.9 % injection 3-10 mL  3-10 mL Intravenous PRN Noralee Stain, DO       Facility-Administered Medications Ordered in Other Encounters  Medication Dose Route Frequency Provider Last Rate Last Admin   sodium chloride flush (NS) 0.9 % injection 10 mL  10 mL  Intravenous PRN Doreatha Massed, MD   10 mL at 10/23/20 1544    Allergies as of 03/07/2023 - Review Complete 03/07/2023  Allergen Reaction Noted   Carboplatin Shortness Of Breath 02/18/2023   Morphine and codeine Itching 07/06/2018   Nickel Itching 06/05/2020   Nortriptyline Other (See Comments) 07/31/2017   Topamax [topiramate] Diarrhea and Nausea Only 07/31/2017   Xanax [alprazolam] Other (See Comments) 06/23/2019  Actifed cold-allergy [chlorpheniramine-phenyleph er] Rash and Other (See Comments) 11/11/2011   Amoxicillin Rash 11/11/2011   Codeine Hives 11/11/2011   Erythromycin Rash 11/11/2011   Gemzar [gemcitabine] Other (See Comments) 01/17/2023   Penicillins Rash 11/11/2011   Red dye Rash 12/24/2022   Sudafed [pseudoephedrine hcl] Rash and Other (See Comments) 11/11/2011    Family History  Problem Relation Age of Onset   Depression Mother    Hypertension Mother    Obesity Mother    Diabetes Mother    Kidney disease Mother    Peripheral vascular disease Father    Atrial fibrillation Father    Crohn's disease Sister    Uterine cancer Sister 46       maternal half sister   COPD Brother    Osteoporosis Brother    Breast cancer Maternal Aunt 60   Colon cancer Neg Hx     Social History   Socioeconomic History   Marital status: Widowed    Spouse name: Not on file   Number of children: 2   Years of education: 2-College   Highest education level: Not on file  Occupational History    Employer: [employer]  Tobacco Use   Smoking status: Every Day    Current packs/day: 0.50    Average packs/day: 0.5 packs/day for 17.2 years (8.6 ttl pk-yrs)    Types: Cigarettes    Start date: 06/21/2001    Last attempt to quit: 06/22/2018   Smokeless tobacco: Never  Vaping Use   Vaping status: Never Used  Substance and Sexual Activity   Alcohol use: Not Currently    Alcohol/week: 1.0 standard drink of alcohol    Types: 1 Glasses of wine per week    Comment: occasionally   Drug  use: No   Sexual activity: Not Currently  Other Topics Concern   Not on file  Social History Narrative   Patient lives at home with her daughter.    Patient has 2 children.    Patient is widowed.    Patient is right handed.    Patient has her Associates degree.      Social Drivers of Corporate investment banker Strain: Low Risk  (08/26/2022)   Received from [facility]   Overall Financial Resource Strain (CARDIA)    Difficulty of Paying Living Expenses: Not hard at all  Food Insecurity: No Food Insecurity (02/10/2023)   Hunger Vital Sign    Worried About Running Out of Food in the Last Year: Never true    Ran Out of Food in the Last Year: Never true  Transportation Needs: No Transportation Needs (02/10/2023)   PRAPARE - Administrator, Civil Service (Medical): No    Lack of Transportation (Non-Medical): No  Physical Activity: Inactive (07/22/2018)   Exercise Vital Sign    Days of Exercise per Week: 0 days    Minutes of Exercise per Session: 0 min  Stress: Stress Concern Present (07/22/2018)   [employer] of Occupational Health - Occupational Stress Questionnaire    Feeling of Stress : Very much  Social Connections: Moderately Isolated (07/22/2018)   Social Connection and Isolation Panel [NHANES]    Frequency of Communication with Friends and Family: More than three times a week    Frequency of Social Gatherings with Friends and Family: More than three times a week    Attends Religious Services: 1 to 4 times per year    Active Member of Clubs or Organizations: No    Attends  Club or Organization Meetings: Never    Marital Status: Widowed  Intimate Partner Violence: Not At Risk (02/10/2023)   Humiliation, Afraid, Rape, and Kick questionnaire    Fear of Current or Ex-Partner: No    Emotionally Abused: No    Physically Abused: No    Sexually Abused: No    Review of Systems: Gen: Denies fever, sweats or chills. No weight loss.  CV: Denies chest  pain, palpitations or edema. Resp: Denies cough, shortness of breath of hemoptysis.  GI: See HPI. GU : Denies urinary burning, blood in urine, increased urinary frequency or incontinence. MS: Denies joint pain, muscles aches or weakness. Derm: Denies rash, itchiness, skin lesions or unhealing ulcers. Psych: Denies depression, anxiety, memory loss or confusion. Heme: + Easy bruising. Neuro:  Denies headaches, dizziness or paresthesias. Endo:  Denies any problems with DM, thyroid or adrenal function.  Physical Exam: Vital signs in last 24 hours: Temp:  [98.1 F (36.7 C)-98.5 F (36.9 C)] 98.5 F (36.9 C) (02/21 2118) Pulse Rate:  [92-132] 108 (02/21 2118) Resp:  [11-20] 16 (02/21 2118) BP: (93-108)/(46-81) 94/59 (02/21 2118) SpO2:  [92 %-100 %] 94 % (02/21 2118)   General: Chronically ill fatigued appearing 53 year old female in no acute distress. Head:  Normocephalic and atraumatic. Eyes:  No scleral icterus. Conjunctiva pink. Ears:  Normal auditory acuity. Nose:  No deformity, discharge or lesions. Mouth:  Dentition intact. No ulcers or lesions.  Neck:  Supple. No lymphadenopathy or thyromegaly.  Lungs: Breath sounds clear throughout. No wheezes, rhonchi or crackles.  Heart: Regular rate and rhythm, no murmurs. Abdomen: Soft, nondistended.  Generalized tenderness throughout > LUQ without rebound or guarding.  Positive bowel sounds all 4 quadrants.  No bruit.  No palpable mass. Rectal: Deferred. Musculoskeletal:  Symmetrical without gross deformities.  Pulses:  Normal pulses noted. Extremities:  Without clubbing or edema. Neurologic:  Alert and  oriented x 4. No focal deficits.  Skin:  Intact without significant lesions or rashes. Psych:  Alert and cooperative. Normal mood and affect.  Intake/Output from previous day: No intake/output data recorded. Intake/Output this shift: No intake/output data recorded.  Lab Results: Recent Labs    03/07/23 1301 03/07/23 2335  03/08/23 0650  WBC 5.8  --  4.4  HGB 9.6* 8.9* 8.1*  HCT 29.3* 27.2* 24.7*  PLT 63*  --  67*   BMET Recent Labs    03/07/23 1301  NA 138  K 3.2*  CL 100  CO2 26  GLUCOSE 108*  BUN 10  CREATININE 0.62  CALCIUM 8.5*   LFT Recent Labs    03/07/23 1301  PROT 6.1*  ALBUMIN 2.4*  AST 68*  ALT 65*  ALKPHOS 401*  BILITOT 1.1   PT/INR No results for input(s): "LABPROT", "INR" in the last 72 hours. Hepatitis Panel No results for input(s): "HEPBSAG", "HCVAB", "HEPAIGM", "HEPBIGM" in the last 72 hours.    Studies/Results: US Venous Img Lower Unilateral Left Result Date: 03/07/2023 CLINICAL DATA:  Left calf swelling EXAM: LEFT LOWER EXTREMITY VENOUS DOPPLER ULTRASOUND TECHNIQUE: Gray-scale sonography with graded compression, as well as color Doppler and duplex ultrasound were performed to evaluate the lower extremity deep venous systems from the level of the common femoral vein and including the common femoral, femoral, profunda femoral, popliteal and calf veins including the posterior tibial, peroneal and gastrocnemius veins when visible. The superficial great saphenous vein was also interrogated. Spectral Doppler was utilized to evaluate flow at rest and with distal augmentation maneuvers in the common  femoral, femoral and popliteal veins. COMPARISON:  None Available. FINDINGS: Contralateral Common Femoral Vein: Respiratory phasicity is normal and symmetric with the symptomatic side. No evidence of thrombus. Normal compressibility. Common Femoral Vein: No evidence of thrombus. Normal compressibility, respiratory phasicity and response to augmentation. Saphenofemoral Junction: No evidence of thrombus. Normal compressibility and flow on color Doppler imaging. Profunda Femoral Vein: No evidence of thrombus. Normal compressibility and flow on color Doppler imaging. Femoral Vein: Femoral vein is patent and unremarkable in the upper thigh. However, the vessel becomes noncompressible in the mid  thigh. No evidence of color flow on color Doppler imaging. Findings are consistent with occlusive thrombus. Occlusive thrombus extends through the distal. Popliteal Vein: Occlusive thrombus extends into the popliteal in pain. Calf Veins: Occlusive thrombus extends into the calf veins. Superficial Great Saphenous Vein: No evidence of thrombus. Normal compressibility. Venous Reflux:  None. Other Findings:  None. IMPRESSION: Positive for acute occlusive DVT in the left lower extremity from the femoral vein in the mid thigh through the popliteal vein and into the proximal calf veins. Electronically Signed   By: Malachy Moan M.D.   On: 03/07/2023 13:30    IMPRESSION/PLAN:  53 year old female admitted with generalized abdominal pain, bloody diarrhea with dark red blood clots and LLE pain/swelling. Query ischemic colitis secondary to chemo. Admission Hg 9.6 -> 8.9 -> today Hg 8.1. INR Pending.  Hemodynamically stable.  -Abdominal/pelvic CTA now -Defer endoscopic recommendations to Dr. Leonides Schanz -Check H/H at noon -Clear liquid diet -IV fluids and pain management per the hospitalist  LLE DVT (femoral vein in the mid thigh through the popliteal vein and into the proximal calf veins) s/p IVC placement  Acute on chronic IDA, secondary to stage IV ovarian cancer and GI bleed. Admission Hg 9.6 -> 8.9 -> today Hg 8.1. INR Pending.  -Transfuse for hemoglobin < 7.5 or as needed if symptomatic -Check H&H at noon  Recurrent high-grade ovarian cancer stage IV with peritoneal carcinomatosis, malignant ascites s/p paracentesis x 4, malignant left pleural effusion, started on Carboplatin, Gemcitabine and Bevacizumab 01/07/2023, last cycle to be completed 03/15/2023. Followed by Dr. Ellin Saba. CTAP 02/12/2023 showed ascites with extensive nodular wall thickening along the course of the ascites, perineum peritoneum and along the surface of bowel loops consistent with known metastatic disease of ovarian cancer, peritoneal  carcinomatosis.  Recent hospitalization one month ago with SBO, resolved with supportive measures and surgical intervention was not required.  GERD, large hiatal hernia with Sheria Lang ulcers s/p robot-assisted laparoscopy/repair of paraesophageal hernia with fundoplasty 08/22/2022  -Continue Pantoprazole 40 mg daily  Elevated LFTs, likely due to chemo   History of colon polyps  Hypomagnesia -Magnesium replacement per the hospitalist    Arnaldo Natal  03/08/2023, 11:53 PM

## 2023-03-08 NOTE — Progress Notes (Signed)
 PROGRESS NOTE    Michelle Holt  VZD:[id number] DOB: [birth year] DOA: 03/07/2023 PCP: Ignatius Specking, MD     Brief Narrative:  Michelle Holt is a 53 y.o. 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  She presents for bright red blood per rectum and left leg pain.  Patient reports that she has had bloody stools on and off for the last 1 month but they have been getting worse and persistent every day over the last 1 week.  She reports the color of her stools are gray or white, though occasionally "golden."  She reports blood clots are mixed in.  She also endorses that her stools are very loose and watery and she feels that she is losing more than she is taking in.  She endorses weight loss.  She reports that her stools have been discolored for the last 4 months and she has scheduled outpatient appt with gastroenterology but the soonest appointment she could get was 3/13 so she was advised to come to the hospital instead.   She also reports that she began having left leg pain yesterday. Worked up revealed left leg DVT. FOBT was positive.  IR was consulted and patient transferred to [facility] for IVC filter placement by IR.  Ovarian Cancer IV diagnosed June 2020, was in remission for 18months. Has since spread to peritoneum. Had allergic reaction to last carboplatin so it was held. Last treatment was Tuesday, next is scheduled for  2/25.  New events last 24 hours / Subjective: Patient awaiting for IVC filter placement today.  Diarrhea seems to have slowed down.  Assessment & Plan:   Principal Problem:   GI bleed Active Problems:   Ovarian cancer, bilateral (HCC)   GERD (gastroesophageal reflux disease)   Port-A-Cath in place   Thrombocytopenia (HCC)   Peritoneal carcinomatosis (HCC)   Malignant neoplasm of ovary (HCC)   DVT (deep venous thrombosis) (HCC)   Left  leg DVT (HCC)   GI bleeding with thrombocytopenia -Hemoglobin 8.1 (baseline hemoglobin 9-10) -FOBT positive -GI consulted -IVF  -IV PPI -CTA abdomen pelvis ordered  Left leg DVT -Not a candidate for anticoagulation due to GI bleeding as well as thrombocytopenia -IR consulted for IVC filter placement  Diarrhea -GI PCR negative -C. 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 Ellin Saba -Chemotherapy schedule 2/25 -Continue home Dilaudid, Zofran  Hypomagnesemia -Replace   DVT prophylaxis: SCD   Code Status: DNR Family Communication: None at bedside Disposition Plan: Home Status is: Inpatient Remains inpatient appropriate because: IVC filter to be placed today.  Further GI workup    Antimicrobials:  Anti-infectives (From admission, onward)    None        Objective: Vitals:   03/08/23 1305 03/08/23 1310 03/08/23 1315 03/08/23 1320  BP: 108/67 (!) 85/60 (!) 88/78 (!) 95/53  Pulse: 86 86 91 88  Resp: 16 14 12 12   Temp:      TempSrc:      SpO2: 100% 100% 100% 100%    Intake/Output Summary (Last 24 hours) at 03/08/2023 1339 Last data filed at 03/07/2023 2115 Gross per 24 hour  Intake 240 ml  Output --  Net 240 ml   There were no vitals filed for this visit.  Examination:  General exam: Appears calm and comfortable  Respiratory  system: Clear to auscultation. Respiratory effort normal. No respiratory distress. No conversational dyspnea.  Cardiovascular system: S1 & S2 heard, RRR. No murmurs. No pedal edema. Gastrointestinal system: Abdomen is nondistended, soft  Central nervous system: Alert and oriented. No focal neurological deficits. Speech clear.  Extremities: Symmetric in appearance  Skin: No rashes, lesions or ulcers on exposed skin  Psychiatry: Judgement and insight appear normal. Mood & affect appropriate.    Data Reviewed: I have personally reviewed following labs and imaging studies  CBC: Recent Labs  Lab 03/07/23 1301 03/07/23 2335 03/08/23 0650  WBC 5.8  --  4.4  NEUTROABS  --   --  2.0  HGB 9.6* 8.9* 8.1*  HCT 29.3* 27.2* 24.7*  MCV 125.2*  --  124.7*  PLT 63*  --  67*   Basic Metabolic Panel: Recent Labs  Lab 03/07/23 1301 03/08/23 0903  NA 138 137  K 3.2* 3.8  CL 100 99  CO2 26 27  GLUCOSE 108* 78  BUN 10 9  CREATININE 0.62 0.58  CALCIUM 8.5* 8.1*  MG  --  1.3*   GFR: Estimated Creatinine Clearance: 79 mL/min (by C-G formula based on SCr of 0.58 mg/dL). Liver Function Tests: Recent Labs  Lab 03/07/23 1301  AST 68*  ALT 65*  ALKPHOS 401*  BILITOT 1.1  PROT 6.1*  ALBUMIN 2.4*   No results for input(s): "LIPASE", "AMYLASE" in the last 168 hours. No results for input(s): "AMMONIA" in the last 168 hours. Coagulation Profile: Recent Labs  Lab 03/08/23 0930  INR 1.1   Cardiac Enzymes: No results for input(s): "CKTOTAL", "CKMB", "CKMBINDEX", "TROPONINI" in the last 168 hours. BNP (last 3 results) No results for input(s): "PROBNP" in the last 8760 hours. HbA1C: No results for input(s): "HGBA1C" in the last 72 hours. CBG: No results for input(s): "GLUCAP" in the last 168 hours. Lipid Profile: No results for input(s): "CHOL", "HDL", "LDLCALC", "TRIG", "CHOLHDL", "LDLDIRECT" in the last 72 hours. Thyroid Function Tests: No results for input(s): "TSH", "T4TOTAL", "FREET4", "T3FREE", "THYROIDAB" in the last 72 hours. Anemia Panel: No results for input(s): "VITAMINB12", "FOLATE", "FERRITIN", "TIBC", "IRON", "RETICCTPCT" in the last 72 hours. Sepsis Labs: No results for input(s): "PROCALCITON", "LATICACIDVEN" in the last 168 hours.  Recent Results (from the past 240 hours)  Gastrointestinal Panel by PCR , Stool     Status: None   Collection Time: 03/07/23  1:01 PM   Specimen: Stool  Result Value Ref Range Status   Campylobacter species NOT DETECTED NOT  DETECTED Final   Plesimonas shigelloides NOT DETECTED NOT DETECTED Final   Salmonella species NOT DETECTED NOT DETECTED Final   Yersinia enterocolitica NOT DETECTED NOT DETECTED Final   Vibrio species NOT DETECTED NOT DETECTED Final   Vibrio cholerae NOT DETECTED NOT DETECTED Final   Enteroaggregative E coli (EAEC) NOT DETECTED NOT DETECTED Final   Enteropathogenic E coli (EPEC) NOT DETECTED NOT DETECTED Final   Enterotoxigenic E coli (ETEC) NOT DETECTED NOT DETECTED Final   Shiga like toxin producing E coli (STEC) NOT DETECTED NOT DETECTED Final   Shigella/Enteroinvasive E coli (EIEC) NOT DETECTED NOT DETECTED Final   Cryptosporidium NOT DETECTED NOT DETECTED Final   Cyclospora cayetanensis NOT DETECTED NOT DETECTED Final   Entamoeba histolytica NOT DETECTED NOT DETECTED Final   Giardia lamblia NOT DETECTED NOT DETECTED Final   Adenovirus F40/41 NOT DETECTED NOT DETECTED Final   Astrovirus NOT DETECTED NOT DETECTED Final   Norovirus GI/GII NOT DETECTED NOT DETECTED Final   Rotavirus A  NOT DETECTED NOT DETECTED Final   Sapovirus (I, II, IV, and V) NOT DETECTED NOT DETECTED Final    Comment: Performed at [facility], [address], [place], [place] [address]      Radiology Studies: US Venous Img Lower Unilateral Left Result Date: 03/07/2023 CLINICAL DATA:  Left calf swelling EXAM: LEFT LOWER EXTREMITY VENOUS DOPPLER ULTRASOUND TECHNIQUE: Gray-scale sonography with graded compression, as well as color Doppler and duplex ultrasound were performed to evaluate the lower extremity deep venous systems from the level of the common femoral vein and including the common femoral, femoral, profunda femoral, popliteal and calf veins including the posterior tibial, peroneal and gastrocnemius veins when visible. The superficial great saphenous vein was also interrogated. Spectral Doppler was utilized to evaluate flow at rest and with distal augmentation maneuvers in the common femoral, femoral  and popliteal veins. COMPARISON:  None Available. FINDINGS: Contralateral Common Femoral Vein: Respiratory phasicity is normal and symmetric with the symptomatic side. No evidence of thrombus. Normal compressibility. Common Femoral Vein: No evidence of thrombus. Normal compressibility, respiratory phasicity and response to augmentation. Saphenofemoral Junction: No evidence of thrombus. Normal compressibility and flow on color Doppler imaging. Profunda Femoral Vein: No evidence of thrombus. Normal compressibility and flow on color Doppler imaging. Femoral Vein: Femoral vein is patent and unremarkable in the upper thigh. However, the vessel becomes noncompressible in the mid thigh. No evidence of color flow on color Doppler imaging. Findings are consistent with occlusive thrombus. Occlusive thrombus extends through the distal. Popliteal Vein: Occlusive thrombus extends into the popliteal in pain. Calf Veins: Occlusive thrombus extends into the calf veins. Superficial Great Saphenous Vein: No evidence of thrombus. Normal compressibility. Venous Reflux:  None. Other Findings:  None. IMPRESSION: Positive for acute occlusive DVT in the left lower extremity from the femoral vein in the mid thigh through the popliteal vein and into the proximal calf veins. Electronically Signed   By: Malachy Moan M.D.   On: 03/07/2023 13:30      Scheduled Meds:  diazepam  5 mg Oral QHS   pantoprazole (PROTONIX) IV  40 mg Intravenous Q12H   sodium chloride flush  3-10 mL Intravenous Q12H   Continuous Infusions:  sodium chloride 100 mL/hr at 03/08/23 1225   sodium chloride 10 mL/hr (03/08/23 1300)     LOS: 1 day   Time spent: 40 minutes   Noralee Stain, DO [facility] 03/08/2023, 1:39 PM   Available via Epic secure chat 7am-7pm After these hours, please refer to coverage provider listed on [email or web address]

## 2023-03-08 NOTE — Consult Note (Signed)
 Referring Provider: Dr. Stasia Cavalier Primary Care Physician:  Ignatius Specking, MD Primary Gastroenterologist:  Dr. Theo Dills  Reason for Consultation: GI bleed, anemia  HPI: Michelle Holt is a 53 y.o. female with a past medical history of arthritis, anxiety, depression, kidney stones, hyperlipidemia, migraine headaches, GERD, 8 cm hiatal hernia with Sheria Lang ulcers s/p robot-assisted laparoscopy/repair of paraesophageal hernia with fundoplasty 08/22/2022, SBO 01/2023, colon polyps, spleen laceration s/p coil embolization 2020, recurrent stage IV ovarian cancer with peritoneal and pleural implants malignant ascites s/p paracentesis x 4 followed by oncologist Dr. Ellin Saba. See surgical history below.  She presented to [facility] ED 03/07/2023 with generalized abdominal pain for the past few months and bloody diarrhea for the past week. She also noted having left leg pain and swelling for the past few days. was not required.  LLE Doppler was positive for DVT (femoral vein in the mid thigh through the popliteal vein and into the proximal calf veins) and she was transferred to [facility] for IVC filter.  A GI consult was requested for further evaluation regarding GI bleed.  Labs in the ED showed a WBC count of 5.8.  Hemoglobin 9.6 (Hg 9.9 on 02/27/2023).  Repeat hemoglobin 8.9.  Hematocrit 29.3.  Platelets 63.  K3.2.  BUN 10.  Creatinine 0.62.  Total bili 1.1.  Alk phos 4 1.  AST 68.  ALT 65.  FOBT positive.  Labs today: WBC 4.4.  Hemoglobin 8.1.  Platelets 67.  INR pending.  GI consult was requested for further evaluation regarding hematochezia.  She describes having intermittent dark red blood with loose bowel movements for 3 weeks which started 1 month ago and for the past week she passed a moderate amount of dark red blood with clots every time she passed a bowel movement.  She passed several moderate to large volume episodes of dark hematochezia with clots overnight with loose stool and  2 episodes of small-volume dark red blood this morning. She infrequently sees bright red blood on the toilet tissue which she attributes to having hemorrhoids.  Not on blood thinners.  No NSAID use.  She has a variable abdominal pain.  She has chronic LUQ pain since she was diagnosed with a splenic laceration s/p coil analyzation in 2020.  She describes having intermittent lower abdominal pain for the past 4 months and over the past week has experienced some upper abdominal pain.  No nausea.  She endorses having intermittent acid reflux and dysphagia for the past few weeks.  She describes food such as hamburger gets stuck to the throat/upper esophagus, she gags and vomits up the stuck food.  On Pantoprazole 40 mg daily at home.  Her most recent EGD was 03/29/2022 which showed an 8 cm hiatal hernia with a few Cameron ulcers without active bleeding.  EGD 02/28/2021 showed a nonobstructing Schatzki's ring which was dilated.  Her most recent colonoscopy was 11/15/2020 which identified 2 tubular adenomatous polyps removed from the colon and external hemorrhoids.  Prior CTAP 02/12/2023 showed a normal bowel without evidence of diverticulosis.  Prior CTAP with contrast 02/12/2023:  FINDINGS: Lower chest: Lung bases are grossly clear.  No pleural effusion.   Hepatobiliary: Extensive fatty liver infiltration. Previous cholecystectomy. Patent portal vein.   Pancreas: Moderate atrophy of the pancreas.   Spleen: Lobular spleen. Anterior to the spleen is a 7 cm complex lesion with cystic areas and potential calcification, unchanged from previous. Embolization coils near the splenic hilum.   Adrenals/Urinary Tract: Adrenal  glands are preserved. No enhancing renal mass or collecting system dilatation. Bladder is underdistended.   Stomach/Bowel: Large bowel is normal course and caliber. There is some scattered stool. Surgical changes along the base of the cecum. Stomach is nondilated. Overall small bowel has areas  of small bowel stool appearance diffusely. Some loops are mildly distended with diameter approaching 3.3 cm. Previously there were more air-fluid levels along the small bowel with more loops that are distended.   Vascular/Lymphatic: Normal caliber aorta and IVC with scattered atherosclerotic calcifications. Scattered small nodes are again identified, unchanged from previous.   Reproductive: Status post hysterectomy. No adnexal masses.   Other: Anasarca. There is scattered ascites identified with diffuse areas of peritoneal and wall thickening along the course of the ascites. The level is similar to previous in the thickening is likely related to peritoneal spread of disease as per provided history. There is specific nodular areas of tissue as well seen adjacent to the distal stomach anteriorly, lesser curve and towards the porta hepatis. Again as on previous. Slight wall thickening along several loops of bowel again seen which is likely as well spread of disease.   Musculoskeletal: Degenerative changes of the spine and pelvis. Streak artifact related to the patient's left hip arthroplasty.   IMPRESSION: Again note is made of ascites with extensive nodular wall thickening along the course of the ascites, perineum peritoneum and along the surface of bowel loops consistent with known metastatic disease of ovarian cancer, peritoneal carcinomatosis.   The previous dilated loops of small bowel are decreasing today in size. Less air-fluid levels but more small bowel stool appearance.   No free air.   Stable numerous other findings in the short time interval.   Paracentesis:  01/22/2023, 01/17/2023, 01/15/2023 and 12/25/2022   GI PROCEDURES:  EGD 03/29/2022: - 8 cm hiatal hernia with a few Cameron ulcers.  - Normal examined duodenum.  - No specimens collected.  EGD 02/28/2021: - Normal hypopharynx.  - Normal esophagus.  - Non-obstructing Schatzki ring. Dilated.  - 8 cm hiatal  hernia. - Normal stomach.  - Normal duodenal bulb and second portion of the duodenum.  - No specimens collected.  Colonoscopy 11/15/2020: - The examined portion of the ileum was normal.  - Two 3 to 5 mm tubular adenomatous polyps in the ascending colon and in the cecum, removed with a cold snare. Resected and retrieved.  - External hemorrhoids.  - Anal papilla(e) were hypertrophied. Impression:  Past Medical History:  Diagnosis Date   Anemia    Anxiety and depression    Arthritis of facet joints at multiple vertebral levels    L5-S1   Constipation    Dyslipidemia    Family history of breast cancer    Family history of uterine cancer    GERD (gastroesophageal reflux disease)    History of hiatal hernia    History of kidney stones    Insomnia    Irritable bowel syndrome    Migraine    Muscle tension headache    Neuropathy of finger    Ovarian carcinoma (HCC)    ovarian   Plantar fasciitis of right foot    Port-A-Cath in place 08/20/2018    Past Surgical History:  Procedure Laterality Date   ABDOMINAL HYSTERECTOMY     BIOPSY  10/27/2020   Procedure: BIOPSY;  Surgeon: Dolores Frame, MD;  [facility];  Service: Gastroenterology;;   CHOLECYSTECTOMY  2008   COLONOSCOPY N/A 08/13/2013   Procedure: COLONOSCOPY;  Surgeon: Malissa Hippo, MD;  [facility];  Service: Endoscopy;  Laterality: N/A;  230-moved to 145 Ann to notify pt   COLONOSCOPY WITH PROPOFOL N/A 11/15/2020   Procedure: COLONOSCOPY WITH PROPOFOL;  Surgeon: Malissa Hippo, MD;  [facility];  Service: Endoscopy;  Laterality: N/A;  1:40   ESOPHAGEAL DILATION N/A 02/28/2021   Procedure: ESOPHAGEAL DILATION;  Surgeon: Malissa Hippo, MD;  [facility];  Service: Endoscopy;  Laterality: N/A;   ESOPHAGOGASTRODUODENOSCOPY     ESOPHAGOGASTRODUODENOSCOPY (EGD) WITH PROPOFOL N/A 07/20/2018   Procedure: ESOPHAGOGASTRODUODENOSCOPY (EGD) WITH PROPOFOL;  Surgeon: Malissa Hippo, MD;  [facility];  Service: Endoscopy;  Laterality: N/A;  Possible esophageal dilation.   ESOPHAGOGASTRODUODENOSCOPY (EGD) WITH PROPOFOL N/A 10/27/2020   Procedure: ESOPHAGOGASTRODUODENOSCOPY (EGD) WITH PROPOFOL;  Surgeon: Dolores Frame, MD;  [facility];  Service: Gastroenterology;  Laterality: N/A;  2:10, pt knows to arrive at 10:15   ESOPHAGOGASTRODUODENOSCOPY (EGD) WITH PROPOFOL N/A 02/28/2021   Procedure: ESOPHAGOGASTRODUODENOSCOPY (EGD) WITH PROPOFOL;  Surgeon: Malissa Hippo, MD;  [facility];  Service: Endoscopy;  Laterality: N/A;  200 ASA 1   ESOPHAGOGASTRODUODENOSCOPY (EGD) WITH PROPOFOL N/A 03/29/2022   Procedure: ESOPHAGOGASTRODUODENOSCOPY (EGD) WITH PROPOFOL;  Surgeon: Dolores Frame, MD;  [facility];  Service: Gastroenterology;  Laterality: N/A;  2:00 pm, asa 1-2   GIVENS CAPSULE STUDY N/A 11/24/2020   Procedure: GIVENS CAPSULE STUDY;  Surgeon: Malissa Hippo, MD;  [facility];  Service: Endoscopy;  Laterality: N/A;  7:30   IR ANGIOGRAM SELECTIVE EACH ADDITIONAL VESSEL  08/01/2018   IR ANGIOGRAM VISCERAL SELECTIVE  08/01/2018   IR EMBO ART  VEN HEMORR LYMPH EXTRAV  INC GUIDE ROADMAPPING  08/01/2018   IR IMAGING GUIDED PORT INSERTION  08/20/2018   IR PERC PLEURAL DRAIN W/INDWELL CATH W/IMG GUIDE  07/08/2018   IR THORACENTESIS ASP PLEURAL SPACE W/IMG GUIDE  07/07/2018   IR US GUIDE VASC ACCESS RIGHT  08/01/2018   PLEURAL EFFUSION DRAINAGE Left 07/13/2018   Procedure: DRAINAGE OF LOCULATED PLEURAL EFFUSION;  Surgeon: Kerin Perna, MD;  Location: [facility] OR;  Service: Thoracic;  Laterality: Left;   POLYPECTOMY  11/15/2020   Procedure: POLYPECTOMY;  Surgeon: Malissa Hippo, MD;  [facility];  Service: Endoscopy;;   REMOVAL OF PLEURAL DRAINAGE CATHETER Left 08/20/2018   Procedure: REMOVAL OF PLEURAL DRAINAGE CATHETER;  Surgeon: Kerin Perna, MD;  Location: [facility] OR;  Service: Thoracic;   Laterality: Left;   REMOVAL OF PLEURAL DRAINAGE CATHETER Left 08/20/2018   Procedure: REMOVAL OF PLEURAL DRAINAGE CATHETER;  Surgeon: Kerin Perna, MD;  Location: [facility] OR;  Service: Thoracic;  Laterality: Left;   TALC PLEURODESIS Left 07/13/2018   Procedure: Talc Pleuradesis;  Surgeon: Donata Clay, Theron Arista, MD;  Location: [facility] OR;  Service: Thoracic;  Laterality: Left;   TOTAL HIP ARTHROPLASTY Left 06/05/2020   Procedure: LEFT TOTAL HIP ARTHROPLASTY ANTERIOR APPROACH;  Surgeon: Tarry Kos, MD;  Location: [facility] OR;  Service: Orthopedics;  Laterality: Left;  3-C   TUBAL LIGATION Bilateral    UTERINE ABLATION     VIDEO ASSISTED THORACOSCOPY Left 07/13/2018   Procedure: VIDEO ASSISTED THORACOSCOPY;  Surgeon: Kerin Perna, MD;  Location: [facility] OR;  Service: Thoracic;  Laterality: Left;    Prior to Admission medications   Medication Sig Start Date End Date Taking? Authorizing Provider  albuterol (VENTOLIN HFA) 108 (90 Base) MCG/ACT inhaler Inhale 2 puffs  into the lungs every 6 (six) hours as needed for wheezing or shortness of breath.   Yes [provider]  diazepam (VALIUM) 5 MG tablet Take 1 tablet (5 mg total) by mouth at bedtime. 09/05/22  Yes Doreatha Massed, MD  dicyclomine (BENTYL) 20 MG tablet Take 1 tablet (20 mg total) by mouth every 6 (six) hours as needed for spasms. 01/07/23  Yes Doreatha Massed, MD  HYDROmorphone (DILAUDID) 8 MG tablet Take 1 tablet (8 mg total) by mouth every 4 (four) hours as needed ([facility]). 02/10/23  Yes Doreatha Massed, MD  LORazepam (ATIVAN) 0.5 MG tablet Take 0.5 mg by mouth 3 (three) times daily as needed for anxiety. 02/04/23  Yes [provider]  ondansetron (ZOFRAN) 4 MG tablet TAKE 1 TABLET BY MOUTH EVERY 8 HOURS AS NEEDED FOR NAUSEA AND VOMITING 02/17/23  Yes Doreatha Massed, MD  ondansetron (ZOFRAN-ODT) 4 MG disintegrating tablet Take 1 tablet (4 mg total) by mouth every 8 (eight) hours as needed for nausea or vomiting.  01/22/23  Yes Doreatha Massed, MD  pantoprazole (PROTONIX) 40 MG tablet Take 1 tablet (40 mg total) by mouth daily. 02/12/23 02/12/24 Yes Vassie Loll, MD  magic mouthwash w/lidocaine SOLN Take 5 mLs by mouth 4 (four) times daily as needed for mouth pain. Suspension contains equal amounts of Maalox Extra Strength, nystatin, diphenhydramine and lidocaine. Patient not taking: Reported on 03/07/2023 01/16/23   Doreatha Massed, MD  metoCLOPramide (REGLAN) 10 MG tablet Take 1 tablet (10 mg total) by mouth every 8 (eight) hours as needed for refractory nausea / vomiting. Patient not taking: Reported on 03/07/2023 02/12/23 02/12/24  Vassie Loll, MD  naloxegol oxalate (MOVANTIK) 25 MG TABS tablet Take 1 tablet (25 mg total) by mouth daily. Patient not taking: Reported on 03/07/2023 02/12/23   Vassie Loll, MD  oxyCODONE (OXYCONTIN) 60 MG 12 hr tablet Take 1 tablet by mouth every 12 (twelve) hours as needed. Patient not taking: Reported on 03/07/2023 01/20/23   Doreatha Massed, MD    Current Facility-Administered Medications  Medication Dose Route Frequency Provider Last Rate Last Admin   diazepam (VALIUM) tablet 5 mg  5 mg Oral QHS Crosley, Debby, MD   5 mg at 03/07/23 2327   HYDROmorphone (DILAUDID) injection 2 mg  2 mg Intravenous Q3H PRN Gery Pray, MD   2 mg at 03/08/23 0640   ondansetron (ZOFRAN-ODT) disintegrating tablet 4 mg  4 mg Oral Q8H PRN Dezii, Gordy Councilman, DO   4 mg at 03/08/23 0238   pantoprazole (PROTONIX) injection 40 mg  40 mg Intravenous Q12H Noralee Stain, DO       sodium chloride flush (NS) 0.9 % injection 3-10 mL  3-10 mL Intravenous Q12H Noralee Stain, DO   10 mL at 03/08/23 0755   sodium chloride flush (NS) 0.9 % injection 3-10 mL  3-10 mL Intravenous PRN Noralee Stain, DO       Facility-Administered Medications Ordered in Other Encounters  Medication Dose Route Frequency Provider Last Rate Last Admin   sodium chloride flush (NS) 0.9 % injection 10 mL  10 mL  Intravenous PRN Doreatha Massed, MD   10 mL at 10/23/20 1544    Allergies as of 03/07/2023 - Review Complete 03/07/2023  Allergen Reaction Noted   Carboplatin Shortness Of Breath 02/18/2023   Morphine and codeine Itching 07/06/2018   Nickel Itching 06/05/2020   Nortriptyline Other (See Comments) 07/31/2017   Topamax [topiramate] Diarrhea and Nausea Only 07/31/2017   Xanax [alprazolam] Other (See Comments) 06/23/2019  Actifed cold-allergy [chlorpheniramine-phenyleph er] Rash and Other (See Comments) 11/11/2011   Amoxicillin Rash 11/11/2011   Codeine Hives 11/11/2011   Erythromycin Rash 11/11/2011   Gemzar [gemcitabine] Other (See Comments) 01/17/2023   Penicillins Rash 11/11/2011   Red dye Rash 12/24/2022   Sudafed [pseudoephedrine hcl] Rash and Other (See Comments) 11/11/2011    Family History  Problem Relation Age of Onset   Depression Mother    Hypertension Mother    Obesity Mother    Diabetes Mother    Kidney disease Mother    Peripheral vascular disease Father    Atrial fibrillation Father    Crohn's disease Sister    Uterine cancer Sister 46       maternal half sister   COPD Brother    Osteoporosis Brother    Breast cancer Maternal Aunt 60   Colon cancer Neg Hx     Social History   Socioeconomic History   Marital status: Widowed    Spouse name: Not on file   Number of children: 2   Years of education: 2-College   Highest education level: Not on file  Occupational History    Employer: [employer]  Tobacco Use   Smoking status: Every Day    Current packs/day: 0.50    Average packs/day: 0.5 packs/day for 17.2 years (8.6 ttl pk-yrs)    Types: Cigarettes    Start date: 06/21/2001    Last attempt to quit: 06/22/2018   Smokeless tobacco: Never  Vaping Use   Vaping status: Never Used  Substance and Sexual Activity   Alcohol use: Not Currently    Alcohol/week: 1.0 standard drink of alcohol    Types: 1 Glasses of wine per week    Comment: occasionally   Drug  use: No   Sexual activity: Not Currently  Other Topics Concern   Not on file  Social History Narrative   Patient lives at home with her daughter.    Patient has 2 children.    Patient is widowed.    Patient is right handed.    Patient has her Associates degree.      Social Drivers of Corporate investment banker Strain: Low Risk  (08/26/2022)   Received from [facility]   Overall Financial Resource Strain (CARDIA)    Difficulty of Paying Living Expenses: Not hard at all  Food Insecurity: No Food Insecurity (02/10/2023)   Hunger Vital Sign    Worried About Running Out of Food in the Last Year: Never true    Ran Out of Food in the Last Year: Never true  Transportation Needs: No Transportation Needs (02/10/2023)   PRAPARE - Administrator, Civil Service (Medical): No    Lack of Transportation (Non-Medical): No  Physical Activity: Inactive (07/22/2018)   Exercise Vital Sign    Days of Exercise per Week: 0 days    Minutes of Exercise per Session: 0 min  Stress: Stress Concern Present (07/22/2018)   [employer] of Occupational Health - Occupational Stress Questionnaire    Feeling of Stress : Very much  Social Connections: Moderately Isolated (07/22/2018)   Social Connection and Isolation Panel [NHANES]    Frequency of Communication with Friends and Family: More than three times a week    Frequency of Social Gatherings with Friends and Family: More than three times a week    Attends Religious Services: 1 to 4 times per year    Active Member of Clubs or Organizations: No    Attends  Club or Organization Meetings: Never    Marital Status: Widowed  Intimate Partner Violence: Not At Risk (02/10/2023)   Humiliation, Afraid, Rape, and Kick questionnaire    Fear of Current or Ex-Partner: No    Emotionally Abused: No    Physically Abused: No    Sexually Abused: No    Review of Systems: Gen: Denies fever, sweats or chills. No weight loss.  CV: Denies chest  pain, palpitations or edema. Resp: Denies cough, shortness of breath of hemoptysis.  GI: See HPI. GU : Denies urinary burning, blood in urine, increased urinary frequency or incontinence. MS: Denies joint pain, muscles aches or weakness. Derm: Denies rash, itchiness, skin lesions or unhealing ulcers. Psych: Denies depression, anxiety, memory loss or confusion. Heme: + Easy bruising. Neuro:  Denies headaches, dizziness or paresthesias. Endo:  Denies any problems with DM, thyroid or adrenal function.  Physical Exam: Vital signs in last 24 hours: Temp:  [98.1 F (36.7 C)-98.5 F (36.9 C)] 98.5 F (36.9 C) (02/21 2118) Pulse Rate:  [92-132] 108 (02/21 2118) Resp:  [11-20] 16 (02/21 2118) BP: (93-108)/(46-81) 94/59 (02/21 2118) SpO2:  [92 %-100 %] 94 % (02/21 2118)   General: Chronically ill fatigued appearing 53 year old female in no acute distress. Head:  Normocephalic and atraumatic. Eyes:  No scleral icterus. Conjunctiva pink. Ears:  Normal auditory acuity. Nose:  No deformity, discharge or lesions. Mouth:  Dentition intact. No ulcers or lesions.  Neck:  Supple. No lymphadenopathy or thyromegaly.  Lungs: Breath sounds clear throughout. No wheezes, rhonchi or crackles.  Heart: Regular rate and rhythm, no murmurs. Abdomen: Soft, nondistended.  Generalized tenderness throughout > LUQ without rebound or guarding.  Positive bowel sounds all 4 quadrants.  No bruit.  No palpable mass. Rectal: Deferred. Musculoskeletal:  Symmetrical without gross deformities.  Pulses:  Normal pulses noted. Extremities:  Without clubbing or edema. Neurologic:  Alert and  oriented x 4. No focal deficits.  Skin:  Intact without significant lesions or rashes. Psych:  Alert and cooperative. Normal mood and affect.  Intake/Output from previous day: No intake/output data recorded. Intake/Output this shift: No intake/output data recorded.  Lab Results: Recent Labs    03/07/23 1301 03/07/23 2335  03/08/23 0650  WBC 5.8  --  4.4  HGB 9.6* 8.9* 8.1*  HCT 29.3* 27.2* 24.7*  PLT 63*  --  67*   BMET Recent Labs    03/07/23 1301  NA 138  K 3.2*  CL 100  CO2 26  GLUCOSE 108*  BUN 10  CREATININE 0.62  CALCIUM 8.5*   LFT Recent Labs    03/07/23 1301  PROT 6.1*  ALBUMIN 2.4*  AST 68*  ALT 65*  ALKPHOS 401*  BILITOT 1.1   PT/INR No results for input(s): "LABPROT", "INR" in the last 72 hours. Hepatitis Panel No results for input(s): "HEPBSAG", "HCVAB", "HEPAIGM", "HEPBIGM" in the last 72 hours.    Studies/Results: US Venous Img Lower Unilateral Left Result Date: 03/07/2023 CLINICAL DATA:  Left calf swelling EXAM: LEFT LOWER EXTREMITY VENOUS DOPPLER ULTRASOUND TECHNIQUE: Gray-scale sonography with graded compression, as well as color Doppler and duplex ultrasound were performed to evaluate the lower extremity deep venous systems from the level of the common femoral vein and including the common femoral, femoral, profunda femoral, popliteal and calf veins including the posterior tibial, peroneal and gastrocnemius veins when visible. The superficial great saphenous vein was also interrogated. Spectral Doppler was utilized to evaluate flow at rest and with distal augmentation maneuvers in the common  femoral, femoral and popliteal veins. COMPARISON:  None Available. FINDINGS: Contralateral Common Femoral Vein: Respiratory phasicity is normal and symmetric with the symptomatic side. No evidence of thrombus. Normal compressibility. Common Femoral Vein: No evidence of thrombus. Normal compressibility, respiratory phasicity and response to augmentation. Saphenofemoral Junction: No evidence of thrombus. Normal compressibility and flow on color Doppler imaging. Profunda Femoral Vein: No evidence of thrombus. Normal compressibility and flow on color Doppler imaging. Femoral Vein: Femoral vein is patent and unremarkable in the upper thigh. However, the vessel becomes noncompressible in the mid  thigh. No evidence of color flow on color Doppler imaging. Findings are consistent with occlusive thrombus. Occlusive thrombus extends through the distal. Popliteal Vein: Occlusive thrombus extends into the popliteal in pain. Calf Veins: Occlusive thrombus extends into the calf veins. Superficial Great Saphenous Vein: No evidence of thrombus. Normal compressibility. Venous Reflux:  None. Other Findings:  None. IMPRESSION: Positive for acute occlusive DVT in the left lower extremity from the femoral vein in the mid thigh through the popliteal vein and into the proximal calf veins. Electronically Signed   By: Malachy Moan M.D.   On: 03/07/2023 13:30    IMPRESSION/PLAN:  53 year old female admitted with generalized abdominal pain, bloody diarrhea with dark red blood clots and LLE pain/swelling. Query ischemic colitis secondary to chemo. Admission Hg 9.6 -> 8.9 -> today Hg 8.1. INR Pending.  Hemodynamically stable.  -Abdominal/pelvic CTA now -Defer endoscopic recommendations to Dr. Leonides Schanz -Check H/H at noon -Clear liquid diet -IV fluids and pain management per the hospitalist  LLE DVT (femoral vein in the mid thigh through the popliteal vein and into the proximal calf veins) s/p IVC placement  Acute on chronic IDA, secondary to stage IV ovarian cancer and GI bleed. Admission Hg 9.6 -> 8.9 -> today Hg 8.1. INR Pending.  -Transfuse for hemoglobin < 7.5 or as needed if symptomatic -Check H&H at noon  Recurrent high-grade ovarian cancer stage IV with peritoneal carcinomatosis, malignant ascites s/p paracentesis x 4, malignant left pleural effusion, started on Carboplatin, Gemcitabine and Bevacizumab 01/07/2023, last cycle to be completed 03/15/2023. Followed by Dr. Ellin Saba. CTAP 02/12/2023 showed ascites with extensive nodular wall thickening along the course of the ascites, perineum peritoneum and along the surface of bowel loops consistent with known metastatic disease of ovarian cancer, peritoneal  carcinomatosis.  Recent hospitalization one month ago with SBO, resolved with supportive measures and surgical intervention was not required.  GERD, large hiatal hernia with Sheria Lang ulcers s/p robot-assisted laparoscopy/repair of paraesophageal hernia with fundoplasty 08/22/2022  -Continue Pantoprazole 40 mg daily  Elevated LFTs, likely due to chemo   History of colon polyps  Hypomagnesia -Magnesium replacement per the hospitalist    Arnaldo Natal  03/08/2023, 11:53 PM

## 2023-03-08 NOTE — Plan of Care (Signed)
   Problem: Education: Goal: Knowledge of General Education information will improve Description Including pain rating scale, medication(s)/side effects and non-pharmacologic comfort measures Outcome: Progressing

## 2023-03-09 ENCOUNTER — Encounter (HOSPITAL_COMMUNITY)
Admission: EM | Disposition: A | Discharge status: Home / Self Care | Payer: Self-pay | Source: Home / Self Care | Attending: Internal Medicine

## 2023-03-09 ENCOUNTER — Inpatient Hospital Stay (HOSPITAL_COMMUNITY): Payer: 59 | Admitting: Anesthesiology

## 2023-03-09 ENCOUNTER — Other Ambulatory Visit: Payer: Self-pay

## 2023-03-09 DIAGNOSIS — K2970 Gastritis, unspecified, without bleeding: Secondary | ICD-10-CM | POA: Diagnosis not present

## 2023-03-09 DIAGNOSIS — K2210 Ulcer of esophagus without bleeding: Secondary | ICD-10-CM

## 2023-03-09 DIAGNOSIS — K922 Gastrointestinal hemorrhage, unspecified: Secondary | ICD-10-CM | POA: Diagnosis not present

## 2023-03-09 DIAGNOSIS — K643 Fourth degree hemorrhoids: Secondary | ICD-10-CM

## 2023-03-09 DIAGNOSIS — E43 Unspecified severe protein-calorie malnutrition: Secondary | ICD-10-CM | POA: Insufficient documentation

## 2023-03-09 DIAGNOSIS — K269 Duodenal ulcer, unspecified as acute or chronic, without hemorrhage or perforation: Secondary | ICD-10-CM | POA: Diagnosis not present

## 2023-03-09 DIAGNOSIS — K5730 Diverticulosis of large intestine without perforation or abscess without bleeding: Secondary | ICD-10-CM | POA: Diagnosis not present

## 2023-03-09 DIAGNOSIS — D509 Iron deficiency anemia, unspecified: Secondary | ICD-10-CM | POA: Diagnosis not present

## 2023-03-09 DIAGNOSIS — K921 Melena: Secondary | ICD-10-CM | POA: Diagnosis not present

## 2023-03-09 HISTORY — PX: ESOPHAGOGASTRODUODENOSCOPY (EGD) WITH PROPOFOL: SHX5813

## 2023-03-09 HISTORY — PX: BIOPSY: SHX5522

## 2023-03-09 HISTORY — PX: COLONOSCOPY WITH PROPOFOL: SHX5780

## 2023-03-09 LAB — TYPE AND SCREEN
ABO/RH(D): O POS
Antibody Screen: NEGATIVE
Unit division: 0

## 2023-03-09 LAB — COMPREHENSIVE METABOLIC PANEL
ALT: 43 U/L (ref 0–44)
AST: 52 U/L — ABNORMAL HIGH (ref 15–41)
Albumin: 2.4 g/dL — ABNORMAL LOW (ref 3.5–5.0)
Alkaline Phosphatase: 147 U/L — ABNORMAL HIGH (ref 38–126)
Anion gap: 6 (ref 5–15)
BUN: 14 mg/dL (ref 6–20)
CO2: 27 mmol/L (ref 22–32)
Calcium: 8.4 mg/dL — ABNORMAL LOW (ref 8.9–10.3)
Chloride: 109 mmol/L (ref 98–111)
Creatinine, Ser: 1.05 mg/dL — ABNORMAL HIGH (ref 0.44–1.00)
GFR, Estimated: >60 mL/min (ref >60)
Glucose, Bld: 84 mg/dL (ref 70–99)
Potassium: 4.2 mmol/L (ref 3.5–5.1)
Sodium: 142 mmol/L (ref 135–145)
Total Bilirubin: 0.9 mg/dL (ref 0.0–1.2)
Total Protein: 4.5 g/dL — ABNORMAL LOW (ref 6.5–8.1)

## 2023-03-09 LAB — BPAM RBC
Blood Product Expiration Date: 202503132359
ISSUE DATE / TIME: 202502221641
Unit Type and Rh: 5100

## 2023-03-09 LAB — CBC
HCT: 29.4 % — ABNORMAL LOW (ref 36.0–46.0)
Hemoglobin: 9.3 g/dL — ABNORMAL LOW (ref 12.0–15.0)
MCH: 31.6 pg (ref 26.0–34.0)
MCHC: 31.6 g/dL (ref 30.0–36.0)
MCV: 100 fL (ref 80.0–100.0)
Platelets: 176 10*3/uL (ref 150–400)
RBC: 2.94 MIL/uL — ABNORMAL LOW (ref 3.87–5.11)
RDW: 19.9 % — ABNORMAL HIGH (ref 11.5–15.5)
WBC: 9.8 10*3/uL (ref 4.0–10.5)
nRBC: 0 % (ref 0.0–0.2)

## 2023-03-09 LAB — MAGNESIUM: Magnesium: 2.4 mg/dL (ref 1.7–2.4)

## 2023-03-09 LAB — HEMOGLOBIN AND HEMATOCRIT, BLOOD
HCT: 29.7 % — ABNORMAL LOW (ref 36.0–46.0)
Hemoglobin: 9.8 g/dL — ABNORMAL LOW (ref 12.0–15.0)

## 2023-03-09 SURGERY — ESOPHAGOGASTRODUODENOSCOPY (EGD) WITH PROPOFOL
Anesthesia: Monitor Anesthesia Care

## 2023-03-09 MED ORDER — PROPOFOL 500 MG/50ML IV EMUL
INTRAVENOUS | Status: DC | PRN
  Administered 2023-03-09: 90 ug/kg/min via INTRAVENOUS
  Administered 2023-03-09: 150 ug/kg/min via INTRAVENOUS

## 2023-03-09 MED ORDER — SODIUM CHLORIDE 0.9 % IV SOLN: INTRAVENOUS | Status: DC

## 2023-03-09 MED ORDER — PHENYLEPHRINE HCL-NACL 20-0.9 MG/250ML-% IV SOLN
INTRAVENOUS | Status: DC | PRN
  Administered 2023-03-09: 80 ug via INTRAVENOUS
  Administered 2023-03-09 (×2): 160 ug via INTRAVENOUS
  Administered 2023-03-09 (×2): 80 ug via INTRAVENOUS
  Administered 2023-03-09: 30 ug/min via INTRAVENOUS

## 2023-03-09 MED ORDER — PANTOPRAZOLE SODIUM 40 MG PO TBEC
40.0000 mg | DELAYED_RELEASE_TABLET | Freq: Two times a day (BID) | ORAL | Status: DC
  Administered 2023-03-09 – 2023-03-10 (×2): 40 mg via ORAL
  Filled 2023-03-09 (×2): qty 1

## 2023-03-09 MED ORDER — PROCHLORPERAZINE EDISYLATE 10 MG/2ML IJ SOLN
10.0000 mg | Freq: Four times a day (QID) | INTRAMUSCULAR | Status: DC | PRN
  Administered 2023-03-09 – 2023-03-10 (×2): 10 mg via INTRAVENOUS
  Filled 2023-03-09 (×2): qty 2

## 2023-03-09 MED ORDER — PROPOFOL 10 MG/ML IV BOLUS
INTRAVENOUS | Status: DC | PRN
Start: 2023-03-09 — End: 2023-03-09
  Administered 2023-03-09 (×3): 20 mg via INTRAVENOUS

## 2023-03-09 SURGICAL SUPPLY — 23 items
BLOCK BITE 60FR ADLT L/F BLUE (MISCELLANEOUS) ×3 IMPLANT
ELECT REM PT RETURN 9FT ADLT (ELECTROSURGICAL) IMPLANT
ELECTRODE REM PT RTRN 9FT ADLT (ELECTROSURGICAL) IMPLANT
FLOOR PAD 36X40 (MISCELLANEOUS) ×2 IMPLANT
FORCEP RJ3 GP 1.8X160 W-NEEDLE (CUTTING FORCEPS) IMPLANT
FORCEPS BIOP RAD 4 LRG CAP 4 (CUTTING FORCEPS) IMPLANT
FORCEPS BIOP RJ4 240 W/NDL (CUTTING FORCEPS) IMPLANT
FORCEPS BXJMBJMB 240X2.8X (CUTTING FORCEPS) IMPLANT
INJECTOR/SNARE I SNARE (MISCELLANEOUS) IMPLANT
LUBRICANT JELLY 4.5OZ STERILE (MISCELLANEOUS) IMPLANT
MANIFOLD NEPTUNE II (INSTRUMENTS) IMPLANT
NDL SCLEROTHERAPY 25GX240 (NEEDLE) IMPLANT
NEEDLE SCLEROTHERAPY 25GX240 (NEEDLE) IMPLANT
PAD FLOOR 36X40 (MISCELLANEOUS) ×3 IMPLANT
PROBE APC STR FIRE (PROBE) IMPLANT
PROBE INJECTION GOLD 7FR (MISCELLANEOUS) IMPLANT
SNARE ROTATE MED OVAL 20MM (MISCELLANEOUS) IMPLANT
SNARE SHORT THROW 13M SML OVAL (MISCELLANEOUS) IMPLANT
SYR 50ML LL SCALE MARK (SYRINGE) IMPLANT
TRAP SPECIMEN MUCOUS 40CC (MISCELLANEOUS) IMPLANT
TUBING ENDO SMARTCAP PENTAX (MISCELLANEOUS) ×6 IMPLANT
TUBING IRRIGATION ENDOGATOR (MISCELLANEOUS) ×3 IMPLANT
WATER STERILE IRR 1000ML POUR (IV SOLUTION) IMPLANT

## 2023-03-09 NOTE — Op Note (Addendum)
 [facility] Patient Name: Michelle Holt Procedure Date : 03/09/2023 MRN: [id number] Attending MD: Particia Lather , , [id number] Date of Birth: 1970/04/30 CSN: [id number] Age: 53 Admit Type: Inpatient Procedure:                Upper GI endoscopy Indications:              Hematochezia, Anemia Providers:                Madelyn Brunner" Francine Graven, RN, Marja Kays, Technician Referring MD:             Hospitalist team Medicines:                Monitored Anesthesia Care Complications:            No immediate complications. Estimated Blood Loss:     Estimated blood loss was minimal. Procedure:                Pre-Anesthesia Assessment:                           - Prior to the procedure, a History and Physical                            was performed, and patient medications and                            allergies were reviewed. The patient's tolerance of                            previous anesthesia was also reviewed. The risks                            and benefits of the procedure and the sedation                            options and risks were discussed with the patient.                            All questions were answered, and informed consent                            was obtained. Prior Anticoagulants: The patient has                            taken no anticoagulant or antiplatelet agents. ASA                            Grade Assessment: III - A patient with severe                            systemic disease. After reviewing the risks and  benefits, the patient was deemed in satisfactory                            condition to undergo the procedure.                           After obtaining informed consent, the endoscope was                            passed under direct vision. Throughout the                            procedure, the patient's blood pressure, pulse, and                             oxygen saturations were monitored continuously. The                            [id number]-[id number] ([id number]) Olympus endoscope was introduced                            through the mouth, and advanced to the second part                            of duodenum. The upper GI endoscopy was                            accomplished without difficulty. The patient                            tolerated the procedure well. Scope In: Scope Out: Findings:      One cratered esophageal ulcer with no bleeding and no stigmata of recent       bleeding was found in the distal esophagus. The lesion was 10 mm in       largest dimension. Biopsies were taken with a cold forceps for histology.      Localized mild inflammation characterized by congestion (edema) and       erythema was found in the gastric body. Biopsies were taken with a cold       forceps for histology.      One non-bleeding cratered duodenal ulcer with a clean ulcer base       (Forrest Class III) was found in the duodenal bulb. The lesion was 14 mm       in largest dimension. Biopsies were carefully taken from the edges of       the ulcer with a cold forceps for histology. Impression:               - Esophageal ulcer with no bleeding and no stigmata                            of recent bleeding. Biopsied.                           - Gastritis. Biopsied.                           -  Non-bleeding duodenal ulcer with a clean ulcer                            base (Forrest Class III). Biopsied. Recommendation:           - Biopsies of the edges of the esophageal and                            duodenal ulcers were taken to rule out malignancy                            due to the patient's metastatic ovarian cancer.                            These ulcers could have contributed to her GI bleed.                           - Use a proton pump inhibitor PO BID for 12 weeks,                            then QD.                           - Avoid NSAID use.                            - Await pathology results.                           - Would consider repeat EGD in 2-3 months to assess                            for healing of esophageal ulcer.                           - Perform a colonoscopy today. Procedure Code(s):        --- Professional ---                           [id number], Esophagogastroduodenoscopy, flexible,                            transoral; with biopsy, single or multiple Diagnosis Code(s):        --- Professional ---                           K22.10, Ulcer of esophagus without bleeding                           K29.70, Gastritis, unspecified, without bleeding                           K26.9, Duodenal ulcer, unspecified as acute or                            chronic,  without hemorrhage or perforation                           K92.1, Melena (includes Hematochezia)                           D64.9, Anemia, unspecified CPT copyright 2022 American Medical Association. All rights reserved. The codes documented in this report are preliminary and upon coder review may  be revised to meet current compliance requirements. Dr Particia Lather "Alan Ripper" Leonides Schanz,  03/09/2023 12:35:00 PM Number of Addenda: 0

## 2023-03-09 NOTE — Op Note (Addendum)
 [facility] Patient Name: Michelle Holt Procedure Date : 03/09/2023 MRN: [id number] Attending MD: Particia Lather , , [id number] Date of Birth: 1970-12-14 CSN: [id number] Age: 53 Admit Type: Inpatient Procedure:                Colonoscopy Indications:              Hematochezia, Anemia Providers:                Madelyn Brunner" Francine Graven, RN, Marja Kays, Technician Referring MD:             Hospitalist team Medicines:                Monitored Anesthesia Care Complications:            No immediate complications. Estimated Blood Loss:     Estimated blood loss: none. Procedure:                Pre-Anesthesia Assessment:                           - Prior to the procedure, a History and Physical                            was performed, and patient medications and                            allergies were reviewed. The patient's tolerance of                            previous anesthesia was also reviewed. The risks                            and benefits of the procedure and the sedation                            options and risks were discussed with the patient.                            All questions were answered, and informed consent                            was obtained. Prior Anticoagulants: The patient has                            taken no anticoagulant or antiplatelet agents. ASA                            Grade Assessment: III - A patient with severe                            systemic disease. After reviewing the risks and  benefits, the patient was deemed in satisfactory                            condition to undergo the procedure.                           After obtaining informed consent, the colonoscope                            was passed under direct vision. Throughout the                            procedure, the patient's blood pressure, pulse, and                            oxygen  saturations were monitored continuously. The                            [id number]-HQ190L ([id number]) Olympus colonoscope was                            introduced through the anus and advanced to the the                            terminal ileum. The colonoscopy was performed                            without difficulty. The patient tolerated the                            procedure well. The quality of the bowel                            preparation was good. The terminal ileum, ileocecal                            valve, appendiceal orifice, and rectum were                            photographed. Scope In: 12:15:15 PM Scope Out: 12:33:08 PM Scope Withdrawal Time: 0 hours 12 minutes 14 seconds  Total Procedure Duration: 0 hours 17 minutes 53 seconds  Findings:      The terminal ileum appeared normal.      A few diverticula were found in the sigmoid colon.      Non-bleeding internal hemorrhoids were found during retroflexion. The       hemorrhoids were Grade IV (internal hemorrhoids that prolapse and cannot       be reduced manually). Impression:               - The examined portion of the ileum was normal.                           - Diverticulosis in the sigmoid colon.                           -  Non-bleeding internal hemorrhoids.                           - No specimens collected. Recommendation:           - Return patient to hospital ward for ongoing care.                           - It is suspected that the patient most likely had                            hematochezia and anemia related to (1) a                            diverticular bleed that has now resolved or (2)                            bleeding from her duodenal or esophageal ulcer.                           - No blood was noted in the colon. Patient had                            yellow stool present.                           - Continue to trend hemoglobin.                           - Patient can follow up with her GI  physician Dr.                            Earmon Phoenix.                           - The findings and recommendations were discussed                            with the patient. Procedure Code(s):        --- Professional ---                           [id number], Colonoscopy, flexible; diagnostic, including                            collection of specimen(s) by brushing or washing,                            when performed (separate procedure) Diagnosis Code(s):        --- Professional ---                           K64.3, Fourth degree hemorrhoids                           K92.1, Melena (includes Hematochezia)  D64.9, Anemia, unspecified                           K57.30, Diverticulosis of large intestine without                            perforation or abscess without bleeding CPT copyright 2022 American Medical Association. All rights reserved. The codes documented in this report are preliminary and upon coder review may  be revised to meet current compliance requirements. Dr Particia Lather [address] [place],  03/09/2023 12:46:58 PM Number of Addenda: 0

## 2023-03-09 NOTE — Progress Notes (Signed)
 OT Screen Note  Patient Details Name: Michelle Holt MRN: [id number] DOB: 12/17/1970   Cancelled Treatment:    Reason Eval/Treat Not Completed: OT screened, no needs identified, will sign off (Pt reports that she has been ambulatory in room independently and completed all ADLs earlier without challenge. OT signing off, reconsult if needed)  03/09/2023  AB, OTR/L  Acute Rehabilitation Services  Office: [phone]   Tristan Schroeder 03/09/2023, 3:17 PM

## 2023-03-09 NOTE — Interval H&P Note (Signed)
 History and Physical Interval Note:  03/09/2023 10:58 AM  Veith Park  has presented today for surgery, with the diagnosis of Anemia, hematochezia.  The various methods of treatment have been discussed with the patient and family. After consideration of risks, benefits and other options for treatment, the patient has consented to  Procedure(s): ESOPHAGOGASTRODUODENOSCOPY (EGD) WITH PROPOFOL (N/A) COLONOSCOPY WITH PROPOFOL (N/A) as a surgical intervention.  The patient's history has been reviewed, patient examined, no change in status, stable for surgery.  I have reviewed the patient's chart and labs.  Questions were answered to the patient's satisfaction.     Imogene Burn

## 2023-03-09 NOTE — Plan of Care (Signed)

## 2023-03-09 NOTE — Progress Notes (Signed)
 Initial Nutrition Assessment  DOCUMENTATION CODES:   Severe malnutrition in context of chronic illness  INTERVENTION:  Advance diet as medically applicable. After diet advancement Boost Plus po TID, each supplement provides 360 kcal and 14 grams of protein Multivitamin with minerals   NUTRITION DIAGNOSIS:   Severe Malnutrition related to chronic illness, cancer and cancer related treatments as evidenced by energy intake < or equal to 50% for > or equal to 1 month, percent weight loss.    GOAL:   Patient will meet greater than or equal to 90% of their needs    MONITOR:   Diet advancement  REASON FOR ASSESSMENT:   Consult Assessment of nutrition requirement/status  ASSESSMENT:   53 y.o. F, Presented to ED from home with complaints of generalized abdominal pain, new onset of diarrhea with blood for 1 week, and left leg pain. Admitted for GI bleed.  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  Review of EMR revealed: Declined oral intake, weight loss 8%  x 1 week, 14% x 1 month, 12% x 90 days. She reports the color of her stools are gray or white, though occasionally "golden."  She reports blood clots are mixed in.  She also endorses that her stools are very loose and watery and she feels that she is losing more than she is taking in.  Patient currently NPO pending EDG.  Suspect due to chronic illness, declined oral intake, diarrhea x 1 week, and reported weight loss, patient meets criteria for severe malnutrition.   Admit weight: 71.7 kg Weight history: 02/27/23 71.7 kg  02/18/23 78.1 kg  02/10/23 76.3 kg  01/17/23 83.8 kg  01/14/23 90.7 kg  01/14/23 90.7 kg  01/07/23 91.4 kg  12/24/22 89.4 kg  12/15/22 86.8 kg  11/28/22 82 kg    Average Meal Intake: NPO  Nutritionally Relevant Medications: Scheduled Meds:   diazepam  5 mg Oral QHS    Continuous Infusions:  PRN Meds:.HYDROmorphone, LORazepam, ondansetron, sodium chloride flush  Labs Reviewed    NUTRITION - FOCUSED PHYSICAL EXAM:  Deferred   Diet Order:   Diet Order             Diet NPO time specified  Diet effective now                   EDUCATION NEEDS:   Not appropriate for education at this time  Skin:  Skin Assessment: Reviewed RN Assessment  Last BM:  03/08/23 (type 6)  Height:   Ht Readings from Last 1 Encounters:  02/10/23 5\' 4"  (1.626 m)    Weight:   Wt Readings from Last 1 Encounters:  02/27/23 71.7 kg    Ideal Body Weight:     BMI:  There is no height or weight on file to calculate BMI.  Estimated Nutritional Needs:   Kcal:  1650-1910 kcal  Protein:  70-90 g  Fluid:  18ml/kca    Jamelle Haring RDN, LDN Clinical Dietitian   If unable to reach, please contact "[facility] Inpatient" secure chat group between 8 am-4 pm daily"

## 2023-03-09 NOTE — Anesthesia Postprocedure Evaluation (Signed)
 Anesthesia Post Note  Patient: Michelle Holt  Procedure(s) Performed: ESOPHAGOGASTRODUODENOSCOPY (EGD) WITH PROPOFOL COLONOSCOPY WITH PROPOFOL BIOPSY     Patient location during evaluation: PACU Anesthesia Type: MAC Level of consciousness: awake and alert, patient cooperative and oriented Pain management: pain level controlled Vital Signs Assessment: post-procedure vital signs reviewed and stable Respiratory status: nonlabored ventilation, spontaneous breathing and respiratory function stable Cardiovascular status: blood pressure returned to baseline and stable Postop Assessment: no apparent nausea or vomiting Anesthetic complications: no   No notable events documented.  Last Vitals:  Vitals:   03/09/23 1110 03/09/23 1241  BP: (!) 107/57 (!) 88/60  Pulse: 82 91  Resp: 10 (!) 23  Temp: 36.6 C 36.6 C  SpO2: 94% 98%    Last Pain:  Vitals:   03/09/23 1241  TempSrc:   PainSc: Asleep                 Ara Grandmaison,E. Deshonna Trnka

## 2023-03-09 NOTE — Transfer of Care (Signed)
 Immediate Anesthesia Transfer of Care Note  Patient: Michelle Holt  Procedure(s) Performed: ESOPHAGOGASTRODUODENOSCOPY (EGD) WITH PROPOFOL COLONOSCOPY WITH PROPOFOL BIOPSY  Patient Location: PACU  Anesthesia Type:MAC  Level of Consciousness: awake, alert , oriented, and patient cooperative  Airway & Oxygen Therapy: Patient Spontanous Breathing  Post-op Assessment: Report given to RN and Post -op Vital signs reviewed and stable  Post vital signs: Reviewed and stable  Last Vitals:  Vitals Value Taken Time  BP 88/60 03/09/23 1241  Temp    Pulse 89 03/09/23 1242  Resp 21 03/09/23 1242  SpO2 95 % 03/09/23 1242  Vitals shown include unfiled device data.  Last Pain:  Vitals:   03/09/23 1110  TempSrc: Temporal  PainSc: 8       Patients Stated Pain Goal: 2 (03/09/23 0300)  Complications: No notable events documented.

## 2023-03-09 NOTE — Progress Notes (Signed)
 PROGRESS NOTE    [name]  ZOX:[id number] DOB: March 09, 1970 DOA: 03/07/2023 PCP: Ignatius Specking, MD     Brief Narrative:  Michelle Holt is a 53 y.o. 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  She presents for bright red blood per rectum and left leg pain.  Patient reports that she has had bloody stools on and off for the last 1 month but they have been getting worse and persistent every day over the last 1 week.  She reports the color of her stools are gray or white, though occasionally "golden."  She reports blood clots are mixed in.  She also endorses that her stools are very loose and watery and she feels that she is losing more than she is taking in.  She endorses weight loss.  She reports that her stools have been discolored for the last 4 months and she has scheduled outpatient appt with gastroenterology but the soonest appointment she could get was 3/13 so she was advised to come to the hospital instead.   She also reports that she began having left leg pain yesterday. Worked up revealed left leg DVT. FOBT was positive.  IR was consulted and patient transferred to [facility] for IVC filter placement by IR.  Ovarian Cancer IV diagnosed June 2020, was in remission for 18months. Has since spread to peritoneum. Had allergic reaction to last carboplatin so it was held. Last treatment was Tuesday, next is scheduled for  2/25.  Patient underwent IVC filter placement 03/08/2023.  Due to GI bleed, GI was consulted.  New events last 24 hours / Subjective: Patient awaiting EGD and colonoscopy today  Assessment & Plan:   Principal Problem:   GI bleed Active Problems:   Ovarian cancer, bilateral (HCC)   GERD (gastroesophageal reflux disease)   Port-A-Cath in place   Thrombocytopenia (HCC)   Peritoneal carcinomatosis (HCC)   Malignant neoplasm of ovary  (HCC)   DVT (deep venous thrombosis) (HCC)   Left leg DVT (HCC)   Diarrhea   Protein-calorie malnutrition, severe   GI bleeding with thrombocytopenia -FOBT positive -Transfused 1 unit packed red blood cell 2/22 -GI consulted; status post EGD and colonoscopy 2/23 -EGD found esophageal ulcer with no bleeding, gastritis, nonbleeding duodenal ulcer -Colonoscopy found diverticulosis, internal hemorrhoids -GI bleeding thought to be secondary to diverticular versus duodenal/esophageal ulcer -PPI twice daily for 12 weeks, then once daily -Avoid NSAIDs -Consider repeat EGD in 2 to 3 months to assess healing of esophageal ulcer -Discussed with Dr. Leonides Schanz today.  Patient to follow-up with Dr. Levon Hedger  Left leg DVT -Not a candidate for anticoagulation due to GI bleeding as well as thrombocytopenia -Status post IVC filter placement 2/22  Diarrhea -GI PCR negative -C. difficile negative  Thrombocytopenia -In setting of chemotherapy -Continue to trend CBC  Stage IV high-grade serous ovarian cancer with peritoneal carcinomatosis -Follows with Dr. Ellin Saba -Chemotherapy schedule 2/25 -Continue home Dilaudid, Zofran     DVT prophylaxis: SCD   Code Status: DNR Family Communication: None at bedside Disposition Plan: Home Status is: Inpatient Remains inpatient appropriate because: Colonoscopy and EGD today  Antimicrobials:  Anti-infectives (From admission, onward)    None        Objective: Vitals:   03/09/23 1241 03/09/23 1245 03/09/23 1300 03/09/23 1315  BP: (!) 88/60 (!) 83/60 101/82 (!) 92/47  Pulse: 91 88 84 82  Resp: (!) 23 (!) 22  13 10  Temp: 97.8 F (36.6 C)   97.8 F (36.6 C)  TempSrc:      SpO2: 98% 95% 97% 95%  Height:        Intake/Output Summary (Last 24 hours) at 03/09/2023 1332 Last data filed at 03/09/2023 1234 Gross per 24 hour  Intake 1516.51 ml  Output --  Net 1516.51 ml   There were no vitals filed for this visit.  Examination:  General  exam: Appears calm and comfortable  Respiratory system: Clear to auscultation. Respiratory effort normal. No respiratory distress. No conversational dyspnea.  Cardiovascular system: S1 & S2 heard, RRR. No murmurs. No pedal edema. Gastrointestinal system: Abdomen is nondistended, soft  Central nervous system: Alert and oriented. No focal neurological deficits. Speech clear.  Extremities: Symmetric in appearance  Skin: No rashes, lesions or ulcers on exposed skin  Psychiatry: Judgement and insight appear normal. Mood & affect appropriate.   Data Reviewed: I have personally reviewed following labs and imaging studies  CBC: Recent Labs  Lab 03/07/23 1301 03/07/23 2335 03/08/23 0650 03/08/23 1425 03/08/23 2216 03/09/23 0300 03/09/23 0410  WBC 5.8  --  4.4  --   --  9.8  --   NEUTROABS  --   --  2.0  --   --   --   --   HGB 9.6*   < > 8.1* 6.4* 9.9* 9.3* 9.8*  HCT 29.3*   < > 24.7* 19.8* 29.7* 29.4* 29.7*  MCV 125.2*  --  124.7*  --   --  100.0  --   PLT 63*  --  67*  --   --  176  --    < > = values in this interval not displayed.   Basic Metabolic Panel: Recent Labs  Lab 03/07/23 1301 03/08/23 0903 03/09/23 0300  NA 138 137 142  K 3.2* 3.8 4.2  CL 100 99 109  CO2 26 27 27   GLUCOSE 108* 78 84  BUN 10 9 14   CREATININE 0.62 0.58 1.05*  CALCIUM 8.5* 8.1* 8.4*  MG  --  1.3* 2.4   GFR: Estimated Creatinine Clearance: 60.2 mL/min (A) (by C-G formula based on SCr of 1.05 mg/dL (H)). Liver Function Tests: Recent Labs  Lab 03/07/23 1301 03/09/23 0300  AST 68* 52*  ALT 65* 43  ALKPHOS 401* 147*  BILITOT 1.1 0.9  PROT 6.1* 4.5*  ALBUMIN 2.4* 2.4*   No results for input(s): "LIPASE", "AMYLASE" in the last 168 hours. No results for input(s): "AMMONIA" in the last 168 hours. Coagulation Profile: Recent Labs  Lab 03/08/23 0930  INR 1.1   Cardiac Enzymes: No results for input(s): "CKTOTAL", "CKMB", "CKMBINDEX", "TROPONINI" in the last 168 hours. BNP (last 3  results) No results for input(s): "PROBNP" in the last 8760 hours. HbA1C: No results for input(s): "HGBA1C" in the last 72 hours. CBG: No results for input(s): "GLUCAP" in the last 168 hours. Lipid Profile: No results for input(s): "CHOL", "HDL", "LDLCALC", "TRIG", "CHOLHDL", "LDLDIRECT" in the last 72 hours. Thyroid Function Tests: No results for input(s): "TSH", "T4TOTAL", "FREET4", "T3FREE", "THYROIDAB" in the last 72 hours. Anemia Panel: No results for input(s): "VITAMINB12", "FOLATE", "FERRITIN", "TIBC", "IRON", "RETICCTPCT" in the last 72 hours. Sepsis Labs: No results for input(s): "PROCALCITON", "LATICACIDVEN" in the last 168 hours.  Recent Results (from the past 240 hours)  Gastrointestinal Panel by PCR , Stool     Status: None   Collection Time: 03/07/23  1:01 PM   Specimen: Stool  Result  Value Ref Range Status   Campylobacter species NOT DETECTED NOT DETECTED Final   Plesimonas shigelloides NOT DETECTED NOT DETECTED Final   Salmonella species NOT DETECTED NOT DETECTED Final   Yersinia enterocolitica NOT DETECTED NOT DETECTED Final   Vibrio species NOT DETECTED NOT DETECTED Final   Vibrio cholerae NOT DETECTED NOT DETECTED Final   Enteroaggregative E coli (EAEC) NOT DETECTED NOT DETECTED Final   Enteropathogenic E coli (EPEC) NOT DETECTED NOT DETECTED Final   Enterotoxigenic E coli (ETEC) NOT DETECTED NOT DETECTED Final   Shiga like toxin producing E coli (STEC) NOT DETECTED NOT DETECTED Final   Shigella/Enteroinvasive E coli (EIEC) NOT DETECTED NOT DETECTED Final   Cryptosporidium NOT DETECTED NOT DETECTED Final   Cyclospora cayetanensis NOT DETECTED NOT DETECTED Final   Entamoeba histolytica NOT DETECTED NOT DETECTED Final   Giardia lamblia NOT DETECTED NOT DETECTED Final   Adenovirus F40/41 NOT DETECTED NOT DETECTED Final   Astrovirus NOT DETECTED NOT DETECTED Final   Norovirus GI/GII NOT DETECTED NOT DETECTED Final   Rotavirus A NOT DETECTED NOT DETECTED Final    Sapovirus (I, II, IV, and V) NOT DETECTED NOT DETECTED Final    Comment: Performed at [facility], [address] [place], [place] [address]  C Difficile Quick Screen w PCR reflex     Status: None   Collection Time: 03/08/23  6:22 PM   Specimen: STOOL  Result Value Ref Range Status   C Diff antigen NEGATIVE NEGATIVE Final   C Diff toxin NEGATIVE NEGATIVE Final   C Diff interpretation No C. difficile detected.  Final    Comment: Performed at [facility], [address] [place], [place] [address]      Radiology Studies: IR IVC FILTER PLMT / S&I Lenise Arena GUID/MOD SED Result Date: 03/08/2023 CLINICAL DATA:  Left lower extremity DVT. GI bleed, a relative contraindication to anticoagulation. Caval filtration requested. EXAM: INFERIOR VENACAVOGRAM IVC FILTER PLACEMENT UNDER FLUOROSCOPY FLUOROSCOPY: Radiation Exposure Index (as provided by the fluoroscopic device): 22 mGy air Kerma TECHNIQUE: Patency of the right IJ vein was confirmed with ultrasound with image documentation. An appropriate skin site was determined. Skin site was marked, prepped with chlorhexidine, and draped using maximum barrier technique. The region was infiltrated locally with 1% lidocaine. Intravenous Fentanyl and Versed 1mg  were administered by RN during a total moderate (conscious) sedation time of 11 minutes; the patient's level of consciousness and physiological / cardiorespiratory status were monitored continuously by radiology RN under my direct supervision. Under real-time ultrasound guidance, the right IJ vein was accessed with a 21 gauge micropuncture needle; the needle tip within the vein was confirmed with ultrasound image documentation. The needle was exchanged over a 018 guidewire for a transitional dilator, which allow advancement of the [facility] [place] wire into the IVC. A long 6 French vascular sheath was placed for inferior venacavography. This demonstrated no caval thrombus. Renal vein inflows were  evident. The Cook Celect IVC filter was advanced through the sheath and successfully deployed under fluoroscopy at the L2-3 level. Followup cavagram demonstrates stable filter position and no evident complication. The sheath was removed and hemostasis achieved at the site. No immediate complication. IMPRESSION: 1. Normal IVC. No thrombus or significant anatomic variation. 2. Technically successful infrarenal IVC filter placement. This is a retrievable model. PLAN: This IVC filter is potentially retrievable. The patient will be assessed for filter retrieval by Interventional Radiology in approximately 8-12 weeks. Further recommendations regarding filter retrieval, continued surveillance or declaration of device  permanence, will be made at that time. Electronically Signed   By: Corlis Leak M.D.   On: 03/08/2023 21:47   CT ANGIO GI BLEED Result Date: 03/08/2023 CLINICAL DATA:  GI bleed. Concern for gas distal bleeding. Same day IVC filter placed. Ovarian carcinoma. * Tracking Code: BO * EXAM: CTA ABDOMEN AND PELVIS WITHOUT AND WITH CONTRAST TECHNIQUE: Multidetector CT imaging of the abdomen and pelvis was performed using the standard protocol during bolus administration of intravenous contrast. Multiplanar reconstructed images and MIPs were obtained and reviewed to evaluate the vascular anatomy. RADIATION DOSE REDUCTION: This exam was performed according to the departmental dose-optimization program which includes automated exposure control, adjustment of the mA and/or kV according to patient size and/or use of iterative reconstruction technique. CONTRAST:  75mL OMNIPAQUE IOHEXOL 350 MG/ML SOLN, 75mL OMNIPAQUE IOHEXOL 350 MG/ML SOLN COMPARISON:  CT 02/12/2023 FINDINGS: VASCULAR Aorta: Normal caliber aorta without aneurysm, dissection, vasculitis or significant stenosis. Celiac: Patent without evidence of aneurysm, dissection, vasculitis or significant stenosis. SMA: Patent without evidence of aneurysm, dissection,  vasculitis or significant stenosis. Renals: Both renal arteries are patent without evidence of aneurysm, dissection, vasculitis, fibromuscular dysplasia or significant stenosis. IMA: Patent without evidence of aneurysm, dissection, vasculitis or significant stenosis. Inflow: Patent without evidence of aneurysm, dissection, vasculitis or significant stenosis. Veins: Infrarenal IVC filter noted. Review of the MIP images confirms the above findings. NON-VASCULAR Lower chest: Lung bases are clear. Hepatobiliary: Hepatic steatosis.  Postcholecystectomy. Pancreas: Pancreas is normal. No ductal dilatation. No pancreatic inflammation. 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 No interval change. Adrenals/urinary tract: Adrenal glands and kidneys are normal. The ureters and bladder normal. Stomach/Bowel: Small hiatal hernia. No intraluminal IV contrast to localize active gastrointestinal bleeding within the small bowel or colon. There are fluid collections within leaves of the mesentery. Several these collections have a thin thickened rim. For example in the LEFT lower quadrant along the pericolic gutter thickened peritoneal measures 3 mm image 46/4. This is not changed from comparison CT. A similar collection in the anterior pelvis on image 64/16. Vascular/Lymphatic: Abdominal aorta is normal caliber. No periportal or retroperitoneal adenopathy. No pelvic adenopathy. Reproductive: Post hysterectomy.  Adnexa unremarkable Other: Peritoneal fluid collections as above. Musculoskeletal: No aggressive osseous lesion. IMPRESSION: VASCULAR 1. No significant arterial vascular abnormality. 2. Infrarenal IVC filter noted. NON-VASCULAR 1. No evidence of active gastrointestinal bleeding within the small bowel or colon. 2. Again demonstrated peritoneal fluid collections in the abdomen pelvis with thickened peritoneal rim. Findings suggest peritoneal carcinomatosis versus peritonitis. Favor peritoneal  metastasis in patient with ovarian carcinoma. No interval change from CT 02/12/2023. 3. Hepatic steatosis Electronically Signed   By: Genevive Bi M.D.   On: 03/08/2023 15:23      Scheduled Meds:  [MAR Hold] diazepam  5 mg Oral QHS   [MAR Hold] pantoprazole (PROTONIX) IV  40 mg Intravenous Q12H   [MAR Hold] sodium chloride flush  3-10 mL Intravenous Q12H   Continuous Infusions:  sodium chloride 100 mL/hr at 03/09/23 1134   sodium chloride     sodium chloride       LOS: 2 days   Time spent: 35 minutes   Noralee Stain, DO [facility] 03/09/2023, 1:32 PM   Available via Epic secure chat 7am-7pm After these hours, please refer to coverage provider listed on [email or web address]

## 2023-03-09 NOTE — OR Nursing (Signed)
 Stool brown; floor RN to administer enema prior to procedure per Gastroenterologist. Alben Spittle [place]

## 2023-03-09 NOTE — Anesthesia Preprocedure Evaluation (Addendum)
 Anesthesia Evaluation  Patient identified by MRN, date of birth, ID band Patient awake    Reviewed: Allergy & Precautions, NPO status , Patient's Chart, lab work & pertinent test results  History of Anesthesia Complications Negative for: history of anesthetic complications  Airway Mallampati: II  TM Distance: >3 FB Neck ROM: Full    Dental  (+) Dental Advisory Given   Pulmonary sleep apnea (does not yet use CPAP) , COPD,  COPD inhaler, Current Smoker and Patient abstained from smoking.   breath sounds clear to auscultation       Cardiovascular (-) angina + DVT (IVC filter)   Rhythm:Regular Rate:Normal     Neuro/Psych  Headaches  Anxiety Depression       GI/Hepatic hiatal hernia,GERD  Medicated and Controlled,,Elevated LFTs GI bleed S/p [employer]   Endo/Other  negative endocrine ROS    Renal/GU negative Renal ROS     Musculoskeletal  (+) Arthritis ,    Abdominal   Peds  Hematology  (+) Blood dyscrasia (Hb 9.8, plt 176k), anemia   Anesthesia Other Findings   Reproductive/Obstetrics Ovarian cancer: chemo                             Anesthesia Physical Anesthesia Plan  ASA: 4  Anesthesia Plan: MAC   Post-op Pain Management: Minimal or no pain anticipated   Induction:   PONV Risk Score and Plan: 1 and Treatment may vary due to age or medical condition  Airway Management Planned: Natural Airway and Nasal Cannula  Additional Equipment: None  Intra-op Plan:   Post-operative Plan:   Informed Consent: I have reviewed the patients History and Physical, chart, labs and discussed the procedure including the risks, benefits and alternatives for the proposed anesthesia with the patient or authorized representative who has indicated his/her understanding and acceptance.   Patient has DNR.  Discussed DNR with patient and Suspend DNR.   Dental advisory given  Plan Discussed with: CRNA  and Surgeon  Anesthesia Plan Comments:         Anesthesia Quick Evaluation

## 2023-03-10 DIAGNOSIS — K922 Gastrointestinal hemorrhage, unspecified: Secondary | ICD-10-CM | POA: Diagnosis not present

## 2023-03-10 LAB — CBC
HCT: 30.9 % — ABNORMAL LOW (ref 36.0–46.0)
Hemoglobin: 10.2 g/dL — ABNORMAL LOW (ref 12.0–15.0)
MCH: 37.8 pg — ABNORMAL HIGH (ref 26.0–34.0)
MCHC: 33 g/dL (ref 30.0–36.0)
MCV: 114.4 fL — ABNORMAL HIGH (ref 80.0–100.0)
Platelets: 120 10*3/uL — ABNORMAL LOW (ref 150–400)
RBC: 2.7 MIL/uL — ABNORMAL LOW (ref 3.87–5.11)
RDW: 26.6 % — ABNORMAL HIGH (ref 11.5–15.5)
WBC: 5.4 10*3/uL (ref 4.0–10.5)
nRBC: 0.4 % — ABNORMAL HIGH (ref 0.0–0.2)

## 2023-03-10 LAB — BASIC METABOLIC PANEL
Anion gap: 6 (ref 5–15)
BUN: 5 mg/dL — ABNORMAL LOW (ref 6–20)
CO2: 26 mmol/L (ref 22–32)
Calcium: 7.6 mg/dL — ABNORMAL LOW (ref 8.9–10.3)
Chloride: 105 mmol/L (ref 98–111)
Creatinine, Ser: 0.64 mg/dL (ref 0.44–1.00)
GFR, Estimated: >60 mL/min (ref >60)
Glucose, Bld: 88 mg/dL (ref 70–99)
Potassium: 3.6 mmol/L (ref 3.5–5.1)
Sodium: 137 mmol/L (ref 135–145)

## 2023-03-10 MED ORDER — PANTOPRAZOLE SODIUM 40 MG PO TBEC: 40.0000 mg | DELAYED_RELEASE_TABLET | Freq: Two times a day (BID) | ORAL | 2 refills | Status: DC

## 2023-03-10 MED ORDER — CHLORHEXIDINE GLUCONATE CLOTH 2 % EX PADS
6.0000 | MEDICATED_PAD | Freq: Every day | CUTANEOUS | Status: DC
  Administered 2023-03-10: 6 via TOPICAL

## 2023-03-10 MED ORDER — HEPARIN SOD (PORK) LOCK FLUSH 100 UNIT/ML IV SOLN
500.0000 [IU] | INTRAVENOUS | Status: AC | PRN
  Administered 2023-03-10: 500 [IU]

## 2023-03-10 MED ORDER — OXYCODONE-ACETAMINOPHEN 5-325 MG PO TABS
1.0000 | ORAL_TABLET | ORAL | Status: DC | PRN
  Administered 2023-03-10: 1 via ORAL
  Filled 2023-03-10: qty 1

## 2023-03-10 NOTE — Progress Notes (Addendum)
   03/10/23 1137  TOC Brief Assessment  Insurance and Status Reviewed  Patient has primary care physician Yes  Home environment has been reviewed home  Prior level of function: independent  Prior/Current Home Services No current home services  Social Drivers of Health Review SDOH reviewed no interventions necessary  Readmission risk has been reviewed Yes  Transition of care needs no transition of care needs at this time    Pt stable for transition home today, Note pt is currently receiving cancer treatment has appointment for next treatment on 2/25. No HH or DME needs noted. Family to transport home.

## 2023-03-10 NOTE — Progress Notes (Signed)
 PT Cancellation Note  Patient Details Name: Michelle Holt MRN: [id number] DOB: [birth year]   Cancelled Treatment:    Reason Eval/Treat Not Completed: PT screened, no needs identified, will sign off (pt moving independently in room and denies need for therapy services. Pt educated for hall ambulation and repeated STS for strengtening. Will defer to mobility specialists per pt request)   Joshue Badal B Emran Molzahn 03/10/2023, 9:12 AM Merryl Hacker, PT Acute Rehabilitation Services Office: [phone]

## 2023-03-10 NOTE — Progress Notes (Signed)
 Pt requested percocet for pain score scale of 0-10.   Lawson Radar, RN

## 2023-03-10 NOTE — Progress Notes (Incomplete)
 49  Michelle Holt [address], [place] [address]    Clinic Day:  03/10/23   Referring physician: Ignatius Specking, MD  Patient Care Team: Ignatius Specking, MD as PCP - General (Internal Medicine) Doreatha Massed, MD as Medical Oncologist (Oncology)   ASSESSMENT & PLAN:   Assessment: 1.  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 -Robotic assisted laparoscopic TAH and BSO and omentectomy on 12/24/2018, pathology showing high-grade serous carcinoma, PT3P NX. -Germline mutation testing was negative. -3 more cycles of adjuvant chemotherapy completed on 03/15/2019. -CTAP on 04/13/2019 showed no findings of active malignancy.  28% reduction in the volume of presumed chronic hematoma/chronic fluid collection splaying the upper margin of the spleen.  Large type III hiatal hernia. -CTAP on 10/13/2019 shows no findings of recurrence or metastatic disease. - Foundation 1 shows HRD+, LOH score>16%.  MSI-stable.  MYC amplification.  T p53 mutation. - We reviewed CT CAP from 02/21/2021 which showed progressive peritoneal metastasis compared to 12/06/2020 scan.  No bowel obstruction.  Small right pleural effusion with mild enlargement of an isolated left external iliac lymph node. - Reviewed EGD from 02/28/2021 which showed hiatal hernia and normal findings. - She reported epigastric and left upper quadrant pain worse in the last 1 week which is related to progression of her malignancy. - 6 cycles of carboplatin and paclitaxel from 03/13/2021 through 06/18/2021 - CT CAP (07/12/2021): No evidence of recurrence or metastatic disease. - Olaparib 300 mg twice daily from 08/22/2021 through 11/28/2022 with progression - Cycle 1 carboplatin, gemcitabine and bevacizumab on 01/07/2023    Plan: 1.  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 - She was recently hospitalized with partial small bowel obstruction which improved with conservative measures. - Overall her clinical condition has slightly improved after first cycle of treatment.  We have discussed continuation of treatment versus best supportive care in the form of hospice.  Patient is requesting to continue treatments.  She has not vomited since discharge from the hospital. - Reviewed labs today: AST and ALT are mildly elevated at 65 and 46 respectively.  Total bilirubin is 1.4.  Creatinine is normal.  CBC is grossly normal with leukocytosis, likely reactive. - She may proceed with cycle 2-day 1 today.  Will obtain CA125 level today.   2.  Lower back/left upper quadrant pain: - Continue Dilaudid 8 mg tablet once in the morning and evening and half tablet in between as needed.  She is off of OxyContin.   3.  Numbness in the feet/cramping in the hands: - She is not taking gabapentin at this time.  4.  Iron deficiency state: - Last Feraheme was in June 2024.  Hemoglobin today is 10.8.  5.  Constipation: - She is not taking stool softeners at this time and has about 4-5 bowel movements per day, soft.  6.  Hypomagnesemia: - Magnesium is normal today.  Continue magnesium twice daily.  Addendum: Patient has received bevacizumab followed by gemcitabine which completed at 12:30 PM.  At 12:43 PM, carboplatin was started.  7 minutes after infusion, she developed difficulty breathing and redness of the face.  She was given 125 mg of Solu-Medrol and 25 mg of Benadryl.  Her breathing has improved  and her vitals returned to normal.  Likely reaction to carboplatin.  She will come back next week to receive day 8 of gemcitabine.  We have to look into alternatives for her cycle 3.    No orders of the defined types were placed in  this encounter.     Michelle Holt,acting as a Neurosurgeon for Doreatha Massed, MD.,have documented all relevant documentation on the behalf of Doreatha Massed, MD,as directed by  Doreatha Massed, MD while in the presence of Doreatha Massed, MD.  ***    Michelle Holt   2/24/20257:08 PM  CHIEF COMPLAINT:   Diagnosis: high-grade serous ovarian cancer    Cancer Staging  No matching staging information was found for the patient.    Prior Therapy: 1. Carboplatin and paclitaxel x 7 cycles from 08/24/2018 to 03/15/2019. 2. Laparoscopic TAH & BSO & omenectomy on 12/24/2018. 3.  6 cycles of carboplatin and paclitaxel from 03/05/2021 to 06/18/2021  Current Therapy:  olaparib    HISTORY OF PRESENT ILLNESS:   Oncology History  Ovarian cancer, bilateral (HCC)  07/07/2018 Pathology Results   PLEURAL FLUID, LEFT (SPECIMEN 1 OF 1, COLLECTED 07/07/18): - MALIGNANT CELLS CONSISTENT WITH ADENOCARCINOMA - SEE COMMENT  Source Pleural Fluid, (Specimen 1 of 1, collected on 07/07/2018) Gross Specimen: Received is/are 1000cc of bloody red fluid with tissue. (TC:tc) Prepared: # Smears: 0 # Concentration Technique Slides (i.e. ThinPrep): 1 # Cell Block: 1 Conventional Additional Studies: Two Hematology slides labeled [id number] Comment The malignant cells are positive for cytokeratin 7, p53, WT-1, Pax-8, Moc31, ER (weak) and EMA but negative for cytokeratin 20, TTF-1, GATA-3, CDX-2 and D2-40. Overall, the phenotype is consistent with a gynecologic primary; clinical correlation recommended.   07/07/2018 Procedure   Successful ultrasound guided left thoracentesis yielding 2.0 L of pleural fluid   07/08/2018 Procedure   1. Technically successful placement of left 14 French pigtail chest drain, placed to Pleur-evac water-seal.   07/08/2018 Procedure   1. Technically successful five [place] double lumen power injectable PICC placement   07/09/2018 Imaging   Ct chest 1. There is a  moderate, loculated left hydropneumothorax with a small air component and moderate fluid component. The largest loculated component is located posteriorly. There is a pigtail drainage catheter about the lateral pleural space. There is no obvious etiology, such as obvious mass or pleural disease.   2. There is a small right pleural effusion with associated atelectasis or consolidation and a subpleural consolidation of the superior segment right lower lobe (series 4, image 56), of uncertain significance, possibly infectious or inflammatory   07/10/2018 Imaging   Ct abdomen and pelvis: 1. The bilateral ovaries are enlarged by heterogeneous appearing cystic lesions, measuring 5.3 x 4.2 cm on the right (series 4, image 72) and 4.5 x 3.2 cm on the left (series 4, image 75). Consider dedicated pelvic ultrasound and/or pelvic MRI to further evaluate for solid components given high suspicion for GYN primary malignancy.   2. No other evidence of mass and no lymphadenopathy in the abdomen or pelvis.   3. Trace ascites. There is some suggestion of omental and peritoneal nodularity (e.g. Series 4, image 43), concerning for peritoneal metastatic disease.    4. Loculated left-sided pleural effusion with left-sided pleural drainage catheter in position. Small right pleural effusion   07/13/2018 Surgery   OPERATION: 1.  Left VATS (video-assisted thoracoscopic surgery) for drainage of loculated pleural effusion. 2.  Talc pleurodesis for malignant pleural effusion. 3.  Placement of PleurX catheter for  management of malignant pleural effusion. 4.  Placement of On-Q analgesia catheter system.    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   07/13/2018 Pathology Results   Pleura, peel, Left Pleural - FIBRO-FIBRINOUS PLEURITIS -  NEGATIVE FOR MALIGNANCY   07/18/2018 Initial Diagnosis   Ovarian cancer, bilateral (HCC)   07/20/2018 Procedure   EGD impression: Normal proximal esophagus and mid esophagus. Mild distal esophageal rings; dilation not performed because of esophagitis. LA Grade C reflux esophagitis. Z-line regular, 30 cm from the incisors. 5 cm hiatal hernia. Non-bleeding gastric ulcer with no stigmata of bleeding. Gastritis. Duodenal erosions without bleeding. Normal second portion of the duodenum. No specimens collected.   08/19/2018 Genetic Testing   Negative genetic testing on the common hereditary cancer panel.  The Common Hereditary Gene Panel offered by [facility] includes sequencing and/or deletion duplication testing of the following 48 genes: APC, ATM, AXIN2, BARD1, BMPR1A, BRCA1, BRCA2, BRIP1, CDH1, CDK4, CDKN2A (p14ARF), CDKN2A (p16INK4a), CHEK2, CTNNA1, DICER1, EPCAM (Deletion/duplication testing only), GREM1 (promoter region deletion/duplication testing only), KIT, MEN1, MLH1, MSH2, MSH3, MSH6, MUTYH, NBN, NF1, NHTL1, PALB2, PDGFRA, PMS2, POLD1, POLE, PTEN, RAD50, RAD51C, RAD51D, RNF43, SDHB, SDHC, SDHD, SMAD4, SMARCA4. STK11, TP53, TSC1, TSC2, and VHL.  The following genes were evaluated for sequence changes only: SDHA and HOXB13 c.251G>A variant only. The report date is August 19, 2018.   08/24/2018 - 03/15/2019 Chemotherapy   Patient is on Treatment Plan : OVARIAN Carboplatin (AUC 6) / Paclitaxel (175) q21d x 6 cycles     03/05/2021 - 06/18/2021 Chemotherapy   Patient is on Treatment Plan : OVARIAN Carboplatin (AUC 6) / Paclitaxel (175) q21d x 6 cycles     01/07/2023 -  Chemotherapy   Patient is on Treatment Plan : OVARIAN RECURRENT 3RD LINE Carboplatin D1 + Gemcitabine D1,8 (4/800) q21d        INTERVAL HISTORY:   Michelle Holt is a 53 y.o. female presenting to clinic today for follow up of high-grade serous ovarian cancer. She was last seen by me on 02/18/23.  Since her last visit, she was admitted to the  hospital from 03/07/23 to 03/10/23 for a GI bleed. Indya was also found to have a left leg DVT and had an IVC filter placement on 2/22. She underwent an EGD and colonoscopy on 2/23 and received 1 unit of PRBC's. EGD found esophageal ulcer with no bleeding, gastritis, nonbleeding duodenal ulcer. Colonoscopy found diverticulosis and internal hemorrhoids.  Her appetite level is at ***%. Her energy level is at ***%. She is accompanied by her a family member.   PAST MEDICAL HISTORY:   Past Medical History: Past Medical History:  Diagnosis Date   Anemia    Anxiety and depression    Arthritis of facet joints at multiple vertebral levels    L5-S1   Constipation    Dyslipidemia    Family history of breast cancer    Family history of uterine cancer    GERD (gastroesophageal reflux disease)    History of hiatal hernia    History of kidney stones    Insomnia    Irritable bowel syndrome    Migraine    Muscle tension headache    Neuropathy of finger    Ovarian carcinoma (HCC)    ovarian   Plantar fasciitis of right foot    Port-A-Cath in place 08/20/2018  Surgical History: Past Surgical History:  Procedure Laterality Date   ABDOMINAL HYSTERECTOMY     BIOPSY  10/27/2020   Procedure: BIOPSY;  Surgeon: Dolores Frame, MD;  [facility];  Service: Gastroenterology;;   CHOLECYSTECTOMY  2008   COLONOSCOPY N/A 08/13/2013   Procedure: COLONOSCOPY;  Surgeon: Malissa Hippo, MD;  [facility]: [facility];  Service: Endoscopy;  Laterality: N/A;  230-moved to 145 Ann to notify pt   COLONOSCOPY WITH PROPOFOL N/A 11/15/2020   Procedure: COLONOSCOPY WITH PROPOFOL;  Surgeon: Malissa Hippo, MD;  [facility];  Service: Endoscopy;  Laterality: N/A;  1:40   ESOPHAGEAL DILATION N/A 02/28/2021   Procedure: ESOPHAGEAL DILATION;  Surgeon: Malissa Hippo, MD;  [facility];  Service: Endoscopy;  Laterality: N/A;   ESOPHAGOGASTRODUODENOSCOPY      ESOPHAGOGASTRODUODENOSCOPY (EGD) WITH PROPOFOL N/A 07/20/2018   Procedure: ESOPHAGOGASTRODUODENOSCOPY (EGD) WITH PROPOFOL;  Surgeon: Malissa Hippo, MD;  [facility];  Service: Endoscopy;  Laterality: N/A;  Possible esophageal dilation.   ESOPHAGOGASTRODUODENOSCOPY (EGD) WITH PROPOFOL N/A 10/27/2020   Procedure: ESOPHAGOGASTRODUODENOSCOPY (EGD) WITH PROPOFOL;  Surgeon: Dolores Frame, MD;  [facility];  Service: Gastroenterology;  Laterality: N/A;  2:10, pt knows to arrive at 10:15   ESOPHAGOGASTRODUODENOSCOPY (EGD) WITH PROPOFOL N/A 02/28/2021   Procedure: ESOPHAGOGASTRODUODENOSCOPY (EGD) WITH PROPOFOL;  Surgeon: Malissa Hippo, MD;  [facility];  Service: Endoscopy;  Laterality: N/A;  200 ASA 1   ESOPHAGOGASTRODUODENOSCOPY (EGD) WITH PROPOFOL N/A 03/29/2022   Procedure: ESOPHAGOGASTRODUODENOSCOPY (EGD) WITH PROPOFOL;  Surgeon: Dolores Frame, MD;  [facility];  Service: Gastroenterology;  Laterality: N/A;  2:00 pm, asa 1-2   GIVENS CAPSULE STUDY N/A 11/24/2020   Procedure: GIVENS CAPSULE STUDY;  Surgeon: Malissa Hippo, MD;  [facility];  Service: Endoscopy;  Laterality: N/A;  7:30   IR ANGIOGRAM SELECTIVE EACH ADDITIONAL VESSEL  08/01/2018   IR ANGIOGRAM VISCERAL SELECTIVE  08/01/2018   IR EMBO ART  VEN HEMORR LYMPH EXTRAV  INC GUIDE ROADMAPPING  08/01/2018   IR IMAGING GUIDED PORT INSERTION  08/20/2018   IR IVC FILTER PLMT / S&I /IMG GUID/MOD SED  03/08/2023   IR PERC PLEURAL DRAIN W/INDWELL CATH W/IMG GUIDE  07/08/2018   IR THORACENTESIS ASP PLEURAL SPACE W/IMG GUIDE  07/07/2018   IR US GUIDE VASC ACCESS RIGHT  08/01/2018   PLEURAL EFFUSION DRAINAGE Left 07/13/2018   Procedure: DRAINAGE OF LOCULATED PLEURAL EFFUSION;  Surgeon: Kerin Perna, MD;  Location[facility];  Service: Thoracic;  Laterality: Left;   POLYPECTOMY  11/15/2020   Procedure: POLYPECTOMY;  Surgeon: Malissa Hippo, MD;  [facility];   Service: Endoscopy;;   REMOVAL OF PLEURAL DRAINAGE CATHETER Left 08/20/2018   Procedure: REMOVAL OF PLEURAL DRAINAGE CATHETER;  Surgeon: Kerin Perna, MD;  Location: [facility] OR;  Service: Thoracic;  Laterality: Left;   REMOVAL OF PLEURAL DRAINAGE CATHETER Left 08/20/2018   Procedure: REMOVAL OF PLEURAL DRAINAGE CATHETER;  Surgeon: Kerin Perna, MD;  [facility];  Service: Thoracic;  Laterality: Left;   TALC PLEURODESIS Left 07/13/2018   Procedure: Talc Pleuradesis;  Surgeon: Donata Clay, Theron Arista, MD;  Location: [facility] OR;  Service: Thoracic;  Laterality: Left;   TOTAL HIP ARTHROPLASTY Left 06/05/2020   Procedure: LEFT TOTAL HIP ARTHROPLASTY ANTERIOR APPROACH;  Surgeon: Tarry Kos, MD;  Location: [facility] OR;  Service: Orthopedics;  Laterality: Left;  3-C   TUBAL LIGATION Bilateral  UTERINE ABLATION     VIDEO ASSISTED THORACOSCOPY Left 07/13/2018   Procedure: VIDEO ASSISTED THORACOSCOPY;  Surgeon: Kerin Perna, MD;  Location: [facility] OR;  Service: Thoracic;  Laterality: Left;    Social History: Social History   Socioeconomic History   Marital status: Widowed    Spouse name: Not on file   Number of children: 2   Years of education: 2-College   Highest education level: Not on file  Occupational History    Employer: [employer]  Tobacco Use   Smoking status: Every Day    Current packs/day: 0.50    Average packs/day: 0.5 packs/day for 17.2 years (8.6 ttl pk-yrs)    Types: Cigarettes    Start date: 06/21/2001    Last attempt to quit: 06/22/2018   Smokeless tobacco: Never  Vaping Use   Vaping status: Never Used  Substance and Sexual Activity   Alcohol use: Not Currently    Alcohol/week: 1.0 standard drink of alcohol    Types: 1 Glasses of wine per week    Comment: occasionally   Drug use: No   Sexual activity: Not Currently  Other Topics Concern   Not on file  Social History Narrative   Patient lives at home with her daughter.    Patient has 2 children.    Patient is widowed.    Patient is  right handed.    Patient has her Associates degree.      Social Drivers of Corporate investment banker Strain: Low Risk  (08/26/2022)   Received from [facility]   Overall Financial Resource Strain (CARDIA)    Difficulty of Paying Living Expenses: Not hard at all  Food Insecurity: No Food Insecurity (02/10/2023)   Hunger Vital Sign    Worried About Running Out of Food in the Last Year: Never true    Ran Out of Food in the Last Year: Never true  Transportation Needs: No Transportation Needs (02/10/2023)   PRAPARE - Administrator, Civil Service (Medical): No    Lack of Transportation (Non-Medical): No  Physical Activity: Inactive (07/22/2018)   Exercise Vital Sign    Days of Exercise per Week: 0 days    Minutes of Exercise per Session: 0 min  Stress: Stress Concern Present (07/22/2018)   [employer] of Occupational Health - Occupational Stress Questionnaire    Feeling of Stress : Very much  Social Connections: Moderately Isolated (07/22/2018)   Social Connection and Isolation Panel [NHANES]    Frequency of Communication with Friends and Family: More than three times a week    Frequency of Social Gatherings with Friends and Family: More than three times a week    Attends Religious Services: 1 to 4 times per year    Active Member of [employer] or Organizations: No    Attends Banker Meetings: Never    Marital Status: Widowed  Intimate Partner Violence: Not At Risk (02/10/2023)   Humiliation, Afraid, Rape, and Kick questionnaire    Fear of Current or Ex-Partner: No    Emotionally Abused: No    Physically Abused: No    Sexually Abused: No    Family History: Family History  Problem Relation Age of Onset   Depression Mother    Hypertension Mother    Obesity Mother    Diabetes Mother    Kidney disease Mother    Peripheral vascular disease Father    Atrial fibrillation Father    Crohn's disease Sister    Uterine  cancer Sister 46        maternal half sister   COPD Brother    Osteoporosis Brother    Breast cancer Maternal Aunt 70   Colon cancer Neg Hx     Current Medications:  Current Outpatient Medications:    albuterol (VENTOLIN HFA) 108 (90 Base) MCG/ACT inhaler, Inhale 2 puffs into the lungs every 6 (six) hours as needed for wheezing or shortness of breath., Disp: , Rfl:    diazepam (VALIUM) 5 MG tablet, Take 1 tablet (5 mg total) by mouth at bedtime., Disp: 30 tablet, Rfl: 5   dicyclomine (BENTYL) 20 MG tablet, Take 1 tablet (20 mg total) by mouth every 6 (six) hours as needed for spasms., Disp: 120 tablet, Rfl: 1   HYDROmorphone (DILAUDID) 8 MG tablet, Take 1 tablet (8 mg total) by mouth every 4 (four) hours as needed (Breakthrough Pain)., Disp: 180 tablet, Rfl: 0   LORazepam (ATIVAN) 0.5 MG tablet, Take 0.5 mg by mouth 3 (three) times daily as needed for anxiety., Disp: , Rfl:    ondansetron (ZOFRAN-ODT) 4 MG disintegrating tablet, Take 1 tablet (4 mg total) by mouth every 8 (eight) hours as needed for nausea or vomiting., Disp: 90 tablet, Rfl: 2   pantoprazole (PROTONIX) 40 MG tablet, Take 1 tablet (40 mg total) by mouth 2 (two) times daily before a meal., Disp: 60 tablet, Rfl: 2 No current facility-administered medications for this visit.  Facility-Administered Medications Ordered in Other Visits:    sodium chloride flush (NS) 0.9 % injection 10 mL, 10 mL, Intravenous, PRN, Doreatha Massed, MD, 10 mL at 10/23/20 1544   Allergies: Allergies  Allergen Reactions   Carboplatin Shortness Of Breath    SOB and flushing of the skin.   Morphine And Codeine Itching   Nickel Itching   Nortriptyline Other (See Comments)    Significant weight gain   Topamax [Topiramate] Diarrhea and Nausea Only   Xanax [Alprazolam] Other (See Comments)    "Can't wake up"   Actifed Cold-Allergy [Chlorpheniramine-Phenyleph Er] Rash and Other (See Comments)    Red dye only   Amoxicillin Rash   Codeine Hives   Erythromycin Rash    Gemzar [Gemcitabine] Other (See Comments)    Feeling flushed, red in the face   Penicillins Rash        Red Dye Rash   Sudafed [Pseudoephedrine Hcl] Rash and Other (See Comments)    Red dye only    REVIEW OF SYSTEMS:   Review of Systems  Constitutional:  Negative for chills, fatigue and fever.  HENT:   Negative for lump/mass, mouth sores, nosebleeds, sore throat and trouble swallowing.   Eyes:  Negative for eye problems.  Respiratory:  Negative for cough and shortness of breath.   Cardiovascular:  Negative for chest pain, leg swelling and palpitations.  Gastrointestinal:  Negative for abdominal pain, constipation, diarrhea, nausea and vomiting.  Genitourinary:  Negative for bladder incontinence, difficulty urinating, dysuria, frequency, hematuria and nocturia.   Musculoskeletal:  Negative for arthralgias, back pain, flank pain, myalgias and neck pain.  Skin:  Negative for itching and rash.  Neurological:  Negative for dizziness, headaches and numbness.  Hematological:  Does not bruise/bleed easily.  Psychiatric/Behavioral:  Negative for depression, sleep disturbance and suicidal ideas. The patient is not nervous/anxious.   All other systems reviewed and are negative.    VITALS:   There were no vitals taken for this visit.  Wt Readings from Last 3 Encounters:  02/27/23 158 lb (71.7 kg)  02/18/23 172 lb 1.6 oz (78.1 kg)  02/10/23 168 lb 3.4 oz (76.3 kg)    There is no height or weight on file to calculate BMI.  Performance status (ECOG): 1 - Symptomatic but completely ambulatory  PHYSICAL EXAM:   Physical Exam Vitals and nursing note reviewed. Exam conducted with a chaperone present.  Constitutional:      Appearance: Normal appearance.  Cardiovascular:     Rate and Rhythm: Normal rate and regular rhythm.     Pulses: Normal pulses.     Heart sounds: Normal heart sounds.  Pulmonary:     Effort: Pulmonary effort is normal.     Breath sounds: Normal breath sounds.   Abdominal:     Palpations: Abdomen is soft. There is no hepatomegaly, splenomegaly or mass.     Tenderness: There is no abdominal tenderness.  Musculoskeletal:     Right lower leg: No edema.     Left lower leg: No edema.  Lymphadenopathy:     Cervical: No cervical adenopathy.     Right cervical: No superficial, deep or posterior cervical adenopathy.    Left cervical: No superficial, deep or posterior cervical adenopathy.     Upper Body:     Right upper body: No supraclavicular or axillary adenopathy.     Left upper body: No supraclavicular or axillary adenopathy.  Neurological:     General: No focal deficit present.     Mental Status: She is alert and oriented to person, place, and time.  Psychiatric:        Mood and Affect: Mood normal.        Behavior: Behavior normal.     LABS:      Latest Ref Rng & Units 03/10/2023    5:25 AM 03/09/2023    4:10 AM 03/09/2023    3:00 AM  CBC  WBC 4.0 - 10.5 K/uL 5.4   9.8   Hemoglobin 12.0 - 15.0 g/dL 40.9  9.8  9.3   Hematocrit 36.0 - 46.0 % 30.9  29.7  29.4   Platelets 150 - 400 K/uL 120   176       Latest Ref Rng & Units 03/10/2023    3:46 AM 03/09/2023    3:00 AM 03/08/2023    9:03 AM  CMP  Glucose 70 - 99 mg/dL 88  84  78   BUN 6 - 20 mg/dL 5  14  9    Creatinine 0.44 - 1.00 mg/dL 8.11  9.14  7.82   Sodium 135 - 145 mmol/L 137  142  137   Potassium 3.5 - 5.1 mmol/L 3.6  4.2  3.8   Chloride 98 - 111 mmol/L 105  109  99   CO2 22 - 32 mmol/L 26  27  27    Calcium 8.9 - 10.3 mg/dL 7.6  8.4  8.1   Total Protein 6.5 - 8.1 g/dL  4.5    Total Bilirubin 0.0 - 1.2 mg/dL  0.9    Alkaline Phos 38 - 126 U/L  147    AST 15 - 41 U/L  52    ALT 0 - 44 U/L  43       No results found for: "CEA1", "CEA" / No results found for: "CEA1", "CEA" No results found for: "PSA1" No results found for: "CAN199" Lab Results  Component Value Date   NFA213 476.0 (H) 02/18/2023    No results found for: "TOTALPROTELP", "ALBUMINELP", "A1GS", "A2GS",  "BETS", "BETA2SER", "GAMS", "MSPIKE", "SPEI" Lab Results  Component Value Date   TIBC 288 11/12/2022   TIBC 329 08/05/2022   TIBC 346 05/27/2022   FERRITIN 206 11/12/2022   FERRITIN 219 08/05/2022   FERRITIN 67 05/27/2022   IRONPCTSAT 17 11/12/2022   IRONPCTSAT 29 08/05/2022   IRONPCTSAT 14 05/27/2022   Lab Results  Component Value Date   LDH 277 (H) 07/18/2018   LDH 193 (H) 07/07/2018     STUDIES:   IR IVC FILTER PLMT / S&I Lenise Arena GUID/MOD SED Result Date: 03/08/2023 CLINICAL DATA:  Left lower extremity DVT. GI bleed, a relative contraindication to anticoagulation. Caval filtration requested. EXAM: INFERIOR VENACAVOGRAM IVC FILTER PLACEMENT UNDER FLUOROSCOPY FLUOROSCOPY: Radiation Exposure Index (as provided by the fluoroscopic device): 22 mGy air Kerma TECHNIQUE: Patency of the right IJ vein was confirmed with ultrasound with image documentation. An appropriate skin site was determined. Skin site was marked, prepped with chlorhexidine, and draped using maximum barrier technique. The region was infiltrated locally with 1% lidocaine. Intravenous Fentanyl and Versed 1mg  were administered by RN during a total moderate (conscious) sedation time of 11 minutes; the patient's level of consciousness and physiological / cardiorespiratory status were monitored continuously by radiology RN under my direct supervision. Under real-time ultrasound guidance, the right IJ vein was accessed with a 21 gauge micropuncture needle; the needle tip within the vein was confirmed with ultrasound image documentation. The needle was exchanged over a 018 guidewire for a transitional dilator, which allow advancement of the [facility] wire into the IVC. A long 6 French vascular sheath was placed for inferior venacavography. This demonstrated no caval thrombus. Renal vein inflows were evident. The [employer] Celect IVC filter was advanced through the sheath and successfully deployed under fluoroscopy at the L2-3 level. Followup  cavagram demonstrates stable filter position and no evident complication. The sheath was removed and hemostasis achieved at the site. No immediate complication. IMPRESSION: 1. Normal IVC. No thrombus or significant anatomic variation. 2. Technically successful infrarenal IVC filter placement. This is a retrievable model. PLAN: This IVC filter is potentially retrievable. The patient will be assessed for filter retrieval by Interventional Radiology in approximately 8-12 weeks. Further recommendations regarding filter retrieval, continued surveillance or declaration of device permanence, will be made at that time. Electronically Signed   By: Corlis Leak M.D.   On: 03/08/2023 21:47   CT ANGIO GI BLEED Result Date: 03/08/2023 CLINICAL DATA:  GI bleed. Concern for gas distal bleeding. Same day IVC filter placed. Ovarian carcinoma. * Tracking Code: BO * EXAM: CTA ABDOMEN AND PELVIS WITHOUT AND WITH CONTRAST TECHNIQUE: Multidetector CT imaging of the abdomen and pelvis was performed using the standard protocol during bolus administration of intravenous contrast. Multiplanar reconstructed images and MIPs were obtained and reviewed to evaluate the vascular anatomy. RADIATION DOSE REDUCTION: This exam was performed according to the departmental dose-optimization program which includes automated exposure control, adjustment of the mA and/or kV according to patient size and/or use of iterative reconstruction technique. CONTRAST:  75mL OMNIPAQUE IOHEXOL 350 MG/ML SOLN, 75mL OMNIPAQUE IOHEXOL 350 MG/ML SOLN COMPARISON:  CT 02/12/2023 FINDINGS: VASCULAR Aorta: Normal caliber aorta without aneurysm, dissection, vasculitis or significant stenosis. Celiac: Patent without evidence of aneurysm, dissection, vasculitis or significant stenosis. SMA: Patent without evidence of aneurysm, dissection, vasculitis or significant stenosis. Renals: Both renal arteries are patent without evidence of aneurysm, dissection, vasculitis, fibromuscular  dysplasia or significant stenosis. IMA: Patent without evidence of aneurysm, dissection, vasculitis or significant stenosis. Inflow: Patent without evidence of aneurysm, dissection, vasculitis or significant stenosis. Veins:  Infrarenal IVC filter noted. Review of the MIP images confirms the above findings. NON-VASCULAR Lower chest: Lung bases are clear. Hepatobiliary: Hepatic steatosis.  Postcholecystectomy. Pancreas: Pancreas is normal. No ductal dilatation. No pancreatic inflammation. 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 No interval change. Adrenals/urinary tract: Adrenal glands and kidneys are normal. The ureters and bladder normal. Stomach/Bowel: Small hiatal hernia. No intraluminal IV contrast to localize active gastrointestinal bleeding within the small bowel or colon. There are fluid collections within leaves of the mesentery. Several these collections have a thin thickened rim. For example in the LEFT lower quadrant along the pericolic gutter thickened peritoneal measures 3 mm image 46/4. This is not changed from comparison CT. A similar collection in the anterior pelvis on image 64/16. Vascular/Lymphatic: Abdominal aorta is normal caliber. No periportal or retroperitoneal adenopathy. No pelvic adenopathy. Reproductive: Post hysterectomy.  Adnexa unremarkable Other: Peritoneal fluid collections as above. Musculoskeletal: No aggressive osseous lesion. IMPRESSION: VASCULAR 1. No significant arterial vascular abnormality. 2. Infrarenal IVC filter noted. NON-VASCULAR 1. No evidence of active gastrointestinal bleeding within the small bowel or colon. 2. Again demonstrated peritoneal fluid collections in the abdomen pelvis with thickened peritoneal rim. Findings suggest peritoneal carcinomatosis versus peritonitis. Favor peritoneal metastasis in patient with ovarian carcinoma. No interval change from CT 02/12/2023. 3. Hepatic steatosis Electronically Signed   By: Genevive Bi M.D.   On: 03/08/2023 15:23   US Venous Img Lower Unilateral Left Result Date: 03/07/2023 CLINICAL DATA:  Left calf swelling EXAM: LEFT LOWER EXTREMITY VENOUS DOPPLER ULTRASOUND TECHNIQUE: Gray-scale sonography with graded compression, as well as color Doppler and duplex ultrasound were performed to evaluate the lower extremity deep venous systems from the level of the common femoral vein and including the common femoral, femoral, profunda femoral, popliteal and calf veins including the posterior tibial, peroneal and gastrocnemius veins when visible. The superficial great saphenous vein was also interrogated. Spectral Doppler was utilized to evaluate flow at rest and with distal augmentation maneuvers in the common femoral, femoral and popliteal veins. COMPARISON:  None Available. FINDINGS: Contralateral Common Femoral Vein: Respiratory phasicity is normal and symmetric with the symptomatic side. No evidence of thrombus. Normal compressibility. Common Femoral Vein: No evidence of thrombus. Normal compressibility, respiratory phasicity and response to augmentation. Saphenofemoral Junction: No evidence of thrombus. Normal compressibility and flow on color Doppler imaging. Profunda Femoral Vein: No evidence of thrombus. Normal compressibility and flow on color Doppler imaging. Femoral Vein: Femoral vein is patent and unremarkable in the upper thigh. However, the vessel becomes noncompressible in the mid thigh. No evidence of color flow on color Doppler imaging. Findings are consistent with occlusive thrombus. Occlusive thrombus extends through the distal. Popliteal Vein: Occlusive thrombus extends into the popliteal in pain. Calf Veins: Occlusive thrombus extends into the calf veins. Superficial Great Saphenous Vein: No evidence of thrombus. Normal compressibility. Venous Reflux:  None. Other Findings:  None. IMPRESSION: Positive for acute occlusive DVT in the left lower extremity from the femoral vein in  the mid thigh through the popliteal vein and into the proximal calf veins. Electronically Signed   By: Malachy Moan M.D.   On: 03/07/2023 13:30   CT ABDOMEN PELVIS W CONTRAST Result Date: 02/12/2023 CLINICAL DATA:  Stage IV ovarian cancer. New onset abdominal pain * Tracking Code: BO * EXAM: CT ABDOMEN AND PELVIS WITH CONTRAST TECHNIQUE: Multidetector CT imaging of the abdomen and pelvis was performed using the standard protocol following bolus administration of intravenous contrast. RADIATION DOSE REDUCTION: This exam  was performed according to the departmental dose-optimization program which includes automated exposure control, adjustment of the mA and/or kV according to patient size and/or use of iterative reconstruction technique. CONTRAST:  OMNIPAQUE IOHEXOL 300 MG/ML  SOLN COMPARISON:  Ultrasound same day 02/12/2023. CT 02/09/2023 and older. FINDINGS: Lower chest: Lung bases are grossly clear.  No pleural effusion. Hepatobiliary: Extensive fatty liver infiltration. Previous cholecystectomy. Patent portal vein. Pancreas: Moderate atrophy of the pancreas. Spleen: Lobular spleen. Anterior to the spleen is a 7 cm complex lesion with cystic areas and potential calcification, unchanged from previous. Embolization coils near the splenic hilum. Adrenals/Urinary Tract: Adrenal glands are preserved. No enhancing renal mass or collecting system dilatation. Bladder is underdistended. Stomach/Bowel: Large bowel is normal course and caliber. There is some scattered stool. Surgical changes along the base of the cecum. Stomach is nondilated. Overall small bowel has areas of small bowel stool appearance diffusely. Some loops are mildly distended with diameter approaching 3.3 cm. Previously there were more air-fluid levels along the small bowel with more loops that are distended. Vascular/Lymphatic: Normal caliber aorta and IVC with scattered atherosclerotic calcifications. Scattered small nodes are again  identified, unchanged from previous. Reproductive: Status post hysterectomy. No adnexal masses. Other: Anasarca. There is scattered ascites identified with diffuse areas of peritoneal and wall thickening along the course of the ascites. The level is similar to previous in the thickening is likely related to peritoneal spread of disease as per provided history. There is specific nodular areas of tissue as well seen adjacent to the distal stomach anteriorly, lesser curve and towards the porta hepatis. Again as on previous. Slight wall thickening along several loops of bowel again seen which is likely as well spread of disease. Musculoskeletal: Degenerative changes of the spine and pelvis. Streak artifact related to the patient's left hip arthroplasty. IMPRESSION: Again note is made of ascites with extensive nodular wall thickening along the course of the ascites, perineum peritoneum and along the surface of bowel loops consistent with known metastatic disease of ovarian cancer, peritoneal carcinomatosis. The previous dilated loops of small bowel are decreasing today in size. Less air-fluid levels but more small bowel stool appearance. No free air. Stable numerous other findings in the short time interval. Electronically Signed   By: Karen Kays M.D.   On: 02/12/2023 13:08   [place] ASCITES (ABDOMEN LIMITED) Result Date: 02/12/2023 CLINICAL DATA:  Ovarian CA, peritoneal carcinomatosis, concern for recurrent large volume of ascites, abdominal discomfort EXAM: LIMITED ABDOMEN ULTRASOUND FOR ASCITES TECHNIQUE: Limited ultrasound survey for ascites was performed in all four abdominal quadrants. COMPARISON:  02/12/2023 CT FINDINGS: Survey of the abdominal 4 quadrants performed. Only a small volume of scattered abdominopelvic ascites some of which is septated and complex. No large volume of ascites demonstrated by ultrasound. Therapeutic paracentesis not performed. IMPRESSION: Small volume of abdominopelvic ascites.  Electronically Signed   By: Judie Petit.  Shick M.D.   On: 02/12/2023 10:34   CT ABDOMEN PELVIS W CONTRAST Result Date: 02/09/2023 CLINICAL DATA:  Abdominal pain, nausea, and vomiting x2 months. Patient has ovarian cancer with metastases. Ultrasound therapeutic paracentesis x3 this month, last on 01/22/2023 with 600 mL clear yellow fluid removed. EXAM: CT ABDOMEN AND PELVIS WITH CONTRAST TECHNIQUE: Multidetector CT imaging of the abdomen and pelvis was performed using the standard protocol following bolus administration of intravenous contrast. RADIATION DOSE REDUCTION: This exam was performed according to the departmental dose-optimization program which includes automated exposure control, adjustment of the mA and/or kV according to patient size and/or  use of iterative reconstruction technique. CONTRAST:  OMNIPAQUE IOHEXOL 300 MG/ML  SOLN COMPARISON:  Numerous prior CTs. The 2 most recent are a CT abdomen pelvis with contrast 12/24/2022 and CTA chest, abdomen and pelvis from 12/15/2022. FINDINGS: Lower chest: An infusion catheter again noted terminating about the superior cavoatrial junction. The cardiac size is normal. There is a small hiatal hernia. Mild elevation right hemidiaphragm with lung bases clear. Hepatobiliary: Moderate to severe hepatic steatosis is increased in the interval. There is no mass enhancement. Gallbladder is absent as before with stable prominent common bile duct measuring 11 mm. Pancreas: Partially atrophic. No focal abnormality. No inflammation. Spleen: Stable 7 cm splenic cystic lesion with patchy calcifications. Lobulated appearance of the spleen. No splenomegaly. Embolization coils again noted at the splenic hilum. No new abnormality. Adrenals/Urinary Tract: Adrenal glands are unremarkable. Kidneys are normal, without renal calculi, focal lesion, or hydronephrosis. Bladder is unremarkable. Stomach/Bowel: Chronic thickened folds in the upper to mid stomach. Mild increase parent fluid  distention. Increased dilatation in duodenum and proximal to mid small bowel up to 4.4 cm caliber. There are thickened folds in multiple dilated and nondilated segments. There is a thickened segment in the central abdomen best seen on 2: 34-37 which is believed to be the transitional segment causing intermediate grade obstruction. Decompressed segments are present downstream. This is probably mid ileal in location. An appendix is not seen. There is mild fecal stasis ascending and transverse colon. Rest of the large bowel is largely contracted. Vascular/Lymphatic: Aortic atherosclerosis. No enlarged abdominal or pelvic lymph nodes. Reproductive: Status post hysterectomy. No adnexal masses. Multiple pelvic phleboliths. Other: Mild-to-moderate free fluid, less than on 12/24/2022. There is enhancement of the peritoneal reflections consistent with peritoneal carcinomatosis, ranging from linear to nodular enhancement as before. No overt progression in this is seen. There are minimal layering presacral ascites as well. There is no free hemorrhage or free air.  No incarcerated hernia. Musculoskeletal: Left hip replacement. Grade 1 degenerative anterolisthesis L4-5 with advanced facet hypertrophy and acquired spinal stenosis. Osteopenia.  No regional bone metastasis is seen. IMPRESSION: 1. Intermediate grade small-bowel obstruction with transitional segment in the central abdomen, probably mid ileal in location, due to a thickened segment. No pneumatosis. 2. Mild-to-moderate ascites, less than on 12/24/2022. 3. Peritoneal carcinomatosis findings without overt progression. 4. Increased moderate to severe hepatic steatosis. 5. Stable 7 cm splenic cystic lesion with patchy calcifications. 6. Aortic atherosclerosis. 7. Small hiatal hernia. 8. Osteopenia and degenerative change. Aortic Atherosclerosis (ICD10-I70.0). Electronically Signed   By: Almira Bar M.D.   On: 02/09/2023 23:03   DG Chest Portable 1 View Result Date:  02/09/2023 CLINICAL DATA:  Shortness of breath with abdominal pain. EXAM: PORTABLE CHEST 1 VIEW COMPARISON:  Chest x-ray 11/12/2021 FINDINGS: Right chest port catheter tip projects over the distal SVC. The heart size and mediastinal contours are within normal limits. Both lungs are clear. The visualized skeletal structures are unremarkable. IMPRESSION: No active disease. Electronically Signed   By: Darliss Cheney M.D.   On: 02/09/2023 21:58

## 2023-03-10 NOTE — Plan of Care (Signed)

## 2023-03-10 NOTE — Care Management Important Message (Signed)
 Important Message  Patient Details  Name: Michelle Holt MRN: [id number] Date of Birth: Jun 10, 1970   Important Message Given:  Yes - Medicare IM     Renie Ora 03/10/2023, 10:50 AM

## 2023-03-10 NOTE — Discharge Summary (Signed)
 Physician Discharge Summary  [name]:[id number] DOB: 25-Oct-1970 DOA: 03/07/2023  PCP: Ignatius Specking, MD  Admit date: 03/07/2023 Discharge date: 03/10/2023  Admitted From: Home Disposition:  Home  Recommendations for Outpatient Follow-up:  Follow up with PCP in 1 week Follow up with Dr. Levon Hedger Follow up with Dr. Ellin Saba Follow-up with IR in 8 to 12 weeks for further recommendation regarding IVC retrieval  Discharge Condition: Stable CODE STATUS: DNR Diet recommendation: Regular diet  Brief/Interim Summary: Michelle Holt is a 53 y.o. 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  She presents for bright red blood per rectum and left leg pain.  Patient reports that she has had bloody stools on and off for the last 1 month but they have been getting worse and persistent every day over the last 1 week.  She reports the color of her stools are gray or white, though occasionally "golden."  She reports blood clots are mixed in.  She also endorses that her stools are very loose and watery and she feels that she is losing more than she is taking in.  She endorses weight loss.  She reports that her stools have been discolored for the last 4 months and she has scheduled outpatient appt with gastroenterology but the soonest appointment she could get was 3/13 so she was advised to come to the hospital instead.    She also reports that she began having left leg pain yesterday. Worked up revealed left leg DVT. FOBT was positive.  IR was consulted and patient transferred to [facility] for IVC filter placement by IR.   Ovarian Cancer IV diagnosed June 2020, was in remission for 18months. Has since spread to peritoneum. Had allergic reaction to last carboplatin so it was held. Last treatment was Tuesday, next is scheduled for  2/25.   Patient underwent IVC filter  placement 03/08/2023.  Due to GI bleed, GI was consulted.  Patient underwent EGD and colonoscopy 2/23.  Report as below.  Hemoglobin remained stable after receiving 1 unit packed red blood cells on 2/22.  Discharge Diagnoses:   Principal Problem:   GI bleed Active Problems:   Ovarian cancer, bilateral (HCC)   GERD (gastroesophageal reflux disease)   Port-A-Cath in place   Thrombocytopenia (HCC)   Peritoneal carcinomatosis (HCC)   Malignant neoplasm of ovary (HCC)   DVT (deep venous thrombosis) (HCC)   Left leg DVT (HCC)   Diarrhea   Protein-calorie malnutrition, severe    GI bleeding with thrombocytopenia -FOBT positive -Transfused 1 unit packed red blood cell 2/22 -GI consulted; status post EGD and colonoscopy 2/23 -EGD found esophageal ulcer with no bleeding, gastritis, nonbleeding duodenal ulcer -Colonoscopy found diverticulosis, internal hemorrhoids -GI bleeding thought to be secondary to diverticular versus duodenal/esophageal ulcer -PPI twice daily for 12 weeks, then once daily -Avoid NSAIDs -Consider repeat EGD in 2 to 3 months to assess healing of esophageal ulcer -Patient to follow-up with Dr. Levon Hedger -Hemoglobin stable this morning 10.2   Left leg DVT -Not a candidate for anticoagulation due to GI bleeding as well as thrombocytopenia -Status post IVC filter placement 2/22   Diarrhea -GI PCR negative -C. difficile negative   Thrombocytopenia -In setting of chemotherapy -Continue to trend CBC   Stage IV high-grade serous ovarian cancer with peritoneal carcinomatosis -Follows with Dr. Ellin Saba -Chemotherapy schedule 2/25 -Continue home Dilaudid, Zofran     Discharge Instructions  Discharge  Instructions     Call MD for:  difficulty breathing, headache or visual disturbances   Complete by: As directed    Call MD for:  extreme fatigue   Complete by: As directed    Call MD for:  persistant dizziness or light-headedness   Complete by: As directed     Call MD for:  persistant nausea and vomiting   Complete by: As directed    Call MD for:  severe uncontrolled pain   Complete by: As directed    Call MD for:  temperature >100.4   Complete by: As directed    Diet general   Complete by: As directed    Discharge instructions   Complete by: As directed    You were cared for by a hospitalist during your hospital stay. If you have any questions about your discharge medications or the care you received while you were in the hospital after you are discharged, you can call the unit and ask to speak with the hospitalist on call if the hospitalist that took care of you is not available. Once you are discharged, your primary care physician will handle any further medical issues. Please note that NO REFILLS for any discharge medications will be authorized once you are discharged, as it is imperative that you return to your primary care physician (or establish a relationship with a primary care physician if you do not have one) for your aftercare needs so that they can reassess your need for medications and monitor your lab values.   Increase activity slowly   Complete by: As directed       Allergies as of 03/10/2023       Reactions   Carboplatin Shortness Of Breath   SOB and flushing of the skin.   Morphine And Codeine Itching   Nickel Itching   Nortriptyline Other (See Comments)   Significant weight gain   Topamax [topiramate] Diarrhea, Nausea Only   Xanax [alprazolam] Other (See Comments)   "Can't wake up"   Actifed Cold-allergy [chlorpheniramine-phenyleph Er] Rash, Other (See Comments)   Red dye only   Amoxicillin Rash   Codeine Hives   Erythromycin Rash   Gemzar [gemcitabine] Other (See Comments)   Feeling flushed, red in the face   Penicillins Rash      Red Dye Rash   Sudafed [pseudoephedrine Hcl] Rash, Other (See Comments)   Red dye only        Medication List     TAKE these medications    albuterol 108 (90 Base) MCG/ACT  inhaler Commonly known as: VENTOLIN HFA Inhale 2 puffs into the lungs every 6 (six) hours as needed for wheezing or shortness of breath.   diazepam 5 MG tablet Commonly known as: VALIUM Take 1 tablet (5 mg total) by mouth at bedtime.   dicyclomine 20 MG tablet Commonly known as: BENTYL Take 1 tablet (20 mg total) by mouth every 6 (six) hours as needed for spasms.   HYDROmorphone 8 MG tablet Commonly known as: DILAUDID Take 1 tablet (8 mg total) by mouth every 4 (four) hours as needed ([facility] Pain).   LORazepam 0.5 MG tablet Commonly known as: ATIVAN Take 0.5 mg by mouth 3 (three) times daily as needed for anxiety.   ondansetron 4 MG disintegrating tablet Commonly known as: ZOFRAN-ODT Take 1 tablet (4 mg total) by mouth every 8 (eight) hours as needed for nausea or vomiting.   pantoprazole 40 MG tablet Commonly known as: PROTONIX Take 1 tablet (40 mg  total) by mouth 2 (two) times daily before a meal. What changed: when to take this        Follow-up Information     Vyas, Dhruv B, MD Follow up.   Specialty: Internal Medicine Contact information: [address] [place] [address] [phone]         Dolores Frame, MD Follow up.   Specialty: Gastroenterology Contact information: [address] [place] [address] [phone]         Doreatha Massed, MD Follow up.   Specialty: Hematology Contact information: [address] [place] [address] [phone]         [facility] INTERVENTIONAL RADIOLOGY Follow up.   Specialty: Radiology Why: Follow-up with IR in 8 to 12 weeks for further recommendation regarding IVC retrieval Contact information: [address] [place] [address] [phone]               Allergies  Allergen Reactions   Carboplatin Shortness Of Breath    SOB and flushing of the skin.   Morphine And Codeine Itching   Nickel Itching   Nortriptyline  Other (See Comments)    Significant weight gain   Topamax [Topiramate] Diarrhea and Nausea Only   Xanax [Alprazolam] Other (See Comments)    "Can't wake up"   Actifed Cold-Allergy [Chlorpheniramine-Phenyleph Er] Rash and Other (See Comments)    Red dye only   Amoxicillin Rash   Codeine Hives   Erythromycin Rash   Gemzar [Gemcitabine] Other (See Comments)    Feeling flushed, red in the face   Penicillins Rash        Red Dye Rash   Sudafed [Pseudoephedrine Hcl] Rash and Other (See Comments)    Red dye only    Consultations: GI IR    Procedures/Studies: IR IVC FILTER PLMT / S&I Lenise Arena GUID/MOD SED Result Date: 03/08/2023 CLINICAL DATA:  Left lower extremity DVT. GI bleed, a relative contraindication to anticoagulation. Caval filtration requested. EXAM: INFERIOR VENACAVOGRAM IVC FILTER PLACEMENT UNDER FLUOROSCOPY FLUOROSCOPY: Radiation Exposure Index (as provided by the fluoroscopic device): 22 mGy air Kerma TECHNIQUE: Patency of the right IJ vein was confirmed with ultrasound with image documentation. An appropriate skin site was determined. Skin site was marked, prepped with chlorhexidine, and draped using maximum barrier technique. The region was infiltrated locally with 1% lidocaine. Intravenous Fentanyl and Versed 1mg  were administered by RN during a total moderate (conscious) sedation time of 11 minutes; the patient's level of consciousness and physiological / cardiorespiratory status were monitored continuously by radiology RN under my direct supervision. Under real-time ultrasound guidance, the right IJ vein was accessed with a 21 gauge micropuncture needle; the needle tip within the vein was confirmed with ultrasound image documentation. The needle was exchanged over a 018 guidewire for a transitional dilator, which allow advancement of the [facility] wire into the IVC. A long 6 French vascular sheath was placed for inferior venacavography. This demonstrated no caval thrombus. Renal  vein inflows were evident. The Cook Celect IVC filter was advanced through the sheath and successfully deployed under fluoroscopy at the L2-3 level. Followup cavagram demonstrates stable filter position and no evident complication. The sheath was removed and hemostasis achieved at the site. No immediate complication. IMPRESSION: 1. Normal IVC. No thrombus or significant anatomic variation. 2. Technically successful infrarenal IVC filter placement. This is a retrievable model. PLAN: This IVC filter is potentially retrievable. The patient will be assessed for filter retrieval by Interventional  Radiology in approximately 8-12 weeks. Further recommendations regarding filter retrieval, continued surveillance or declaration of device permanence, will be made at that time. Electronically Signed   By: Corlis Leak M.D.   On: 03/08/2023 21:47   CT ANGIO GI BLEED Result Date: 03/08/2023 CLINICAL DATA:  GI bleed. Concern for gas distal bleeding. Same day IVC filter placed. Ovarian carcinoma. * Tracking Code: BO * EXAM: CTA ABDOMEN AND PELVIS WITHOUT AND WITH CONTRAST TECHNIQUE: Multidetector CT imaging of the abdomen and pelvis was performed using the standard protocol during bolus administration of intravenous contrast. Multiplanar reconstructed images and MIPs were obtained and reviewed to evaluate the vascular anatomy. RADIATION DOSE REDUCTION: This exam was performed according to the departmental dose-optimization program which includes automated exposure control, adjustment of the mA and/or kV according to patient size and/or use of iterative reconstruction technique. CONTRAST:  75mL OMNIPAQUE IOHEXOL 350 MG/ML SOLN, 75mL OMNIPAQUE IOHEXOL 350 MG/ML SOLN COMPARISON:  CT 02/12/2023 FINDINGS: VASCULAR Aorta: Normal caliber aorta without aneurysm, dissection, vasculitis or significant stenosis. Celiac: Patent without evidence of aneurysm, dissection, vasculitis or significant stenosis. SMA: Patent without evidence of  aneurysm, dissection, vasculitis or significant stenosis. Renals: Both renal arteries are patent without evidence of aneurysm, dissection, vasculitis, fibromuscular dysplasia or significant stenosis. IMA: Patent without evidence of aneurysm, dissection, vasculitis or significant stenosis. Inflow: Patent without evidence of aneurysm, dissection, vasculitis or significant stenosis. Veins: Infrarenal IVC filter noted. Review of the MIP images confirms the above findings. NON-VASCULAR Lower chest: Lung bases are clear. Hepatobiliary: Hepatic steatosis.  Postcholecystectomy. Pancreas: Pancreas is normal. No ductal dilatation. No pancreatic inflammation. 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 No interval change. Adrenals/urinary tract: Adrenal glands and kidneys are normal. The ureters and bladder normal. Stomach/Bowel: Small hiatal hernia. No intraluminal IV contrast to localize active gastrointestinal bleeding within the small bowel or colon. There are fluid collections within leaves of the mesentery. Several these collections have a thin thickened rim. For example in the LEFT lower quadrant along the pericolic gutter thickened peritoneal measures 3 mm image 46/4. This is not changed from comparison CT. A similar collection in the anterior pelvis on image 64/16. Vascular/Lymphatic: Abdominal aorta is normal caliber. No periportal or retroperitoneal adenopathy. No pelvic adenopathy. Reproductive: Post hysterectomy.  Adnexa unremarkable Other: Peritoneal fluid collections as above. Musculoskeletal: No aggressive osseous lesion. IMPRESSION: VASCULAR 1. No significant arterial vascular abnormality. 2. Infrarenal IVC filter noted. NON-VASCULAR 1. No evidence of active gastrointestinal bleeding within the small bowel or colon. 2. Again demonstrated peritoneal fluid collections in the abdomen pelvis with thickened peritoneal rim. Findings suggest peritoneal carcinomatosis versus peritonitis.  Favor peritoneal metastasis in patient with ovarian carcinoma. No interval change from CT 02/12/2023. 3. Hepatic steatosis Electronically Signed   By: Genevive Bi M.D.   On: 03/08/2023 15:23   US Venous Img Lower Unilateral Left Result Date: 03/07/2023 CLINICAL DATA:  Left calf swelling EXAM: LEFT LOWER EXTREMITY VENOUS DOPPLER ULTRASOUND TECHNIQUE: Gray-scale sonography with graded compression, as well as color Doppler and duplex ultrasound were performed to evaluate the lower extremity deep venous systems from the level of the common femoral vein and including the common femoral, femoral, profunda femoral, popliteal and calf veins including the posterior tibial, peroneal and gastrocnemius veins when visible. The superficial great saphenous vein was also interrogated. Spectral Doppler was utilized to evaluate flow at rest and with distal augmentation maneuvers in the common femoral, femoral and popliteal veins. COMPARISON:  None Available. FINDINGS: Contralateral Common Femoral Vein: Respiratory phasicity  is normal and symmetric with the symptomatic side. No evidence of thrombus. Normal compressibility. Common Femoral Vein: No evidence of thrombus. Normal compressibility, respiratory phasicity and response to augmentation. Saphenofemoral Junction: No evidence of thrombus. Normal compressibility and flow on color Doppler imaging. Profunda Femoral Vein: No evidence of thrombus. Normal compressibility and flow on color Doppler imaging. Femoral Vein: Femoral vein is patent and unremarkable in the upper thigh. However, the vessel becomes noncompressible in the mid thigh. No evidence of color flow on color Doppler imaging. Findings are consistent with occlusive thrombus. Occlusive thrombus extends through the distal. Popliteal Vein: Occlusive thrombus extends into the popliteal in pain. Calf Veins: Occlusive thrombus extends into the calf veins. Superficial Great Saphenous Vein: No evidence of thrombus. Normal  compressibility. Venous Reflux:  None. Other Findings:  None. IMPRESSION: Positive for acute occlusive DVT in the left lower extremity from the femoral vein in the mid thigh through the popliteal vein and into the proximal calf veins. Electronically Signed   By: Malachy Moan M.D.   On: 03/07/2023 13:30   CT ABDOMEN PELVIS W CONTRAST Result Date: 02/12/2023 CLINICAL DATA:  Stage IV ovarian cancer. New onset abdominal pain * Tracking Code: BO * EXAM: CT ABDOMEN AND PELVIS WITH CONTRAST TECHNIQUE: Multidetector CT imaging of the abdomen and pelvis was performed using the standard protocol following bolus administration of intravenous contrast. RADIATION DOSE REDUCTION: This exam was performed according to the departmental dose-optimization program which includes automated exposure control, adjustment of the mA and/or kV according to patient size and/or use of iterative reconstruction technique. CONTRAST:  OMNIPAQUE IOHEXOL 300 MG/ML  SOLN COMPARISON:  Ultrasound same day 02/12/2023. CT 02/09/2023 and older. FINDINGS: Lower chest: Lung bases are grossly clear.  No pleural effusion. Hepatobiliary: Extensive fatty liver infiltration. Previous cholecystectomy. Patent portal vein. Pancreas: Moderate atrophy of the pancreas. Spleen: Lobular spleen. Anterior to the spleen is a 7 cm complex lesion with cystic areas and potential calcification, unchanged from previous. Embolization coils near the splenic hilum. Adrenals/Urinary Tract: Adrenal glands are preserved. No enhancing renal mass or collecting system dilatation. Bladder is underdistended. Stomach/Bowel: Large bowel is normal course and caliber. There is some scattered stool. Surgical changes along the base of the cecum. Stomach is nondilated. Overall small bowel has areas of small bowel stool appearance diffusely. Some loops are mildly distended with diameter approaching 3.3 cm. Previously there were more air-fluid levels along the small bowel with more  loops that are distended. Vascular/Lymphatic: Normal caliber aorta and IVC with scattered atherosclerotic calcifications. Scattered small nodes are again identified, unchanged from previous. Reproductive: Status post hysterectomy. No adnexal masses. Other: Anasarca. There is scattered ascites identified with diffuse areas of peritoneal and wall thickening along the course of the ascites. The level is similar to previous in the thickening is likely related to peritoneal spread of disease as per provided history. There is specific nodular areas of tissue as well seen adjacent to the distal stomach anteriorly, lesser curve and towards the porta hepatis. Again as on previous. Slight wall thickening along several loops of bowel again seen which is likely as well spread of disease. Musculoskeletal: Degenerative changes of the spine and pelvis. Streak artifact related to the patient's left hip arthroplasty. IMPRESSION: Again note is made of ascites with extensive nodular wall thickening along the course of the ascites, perineum peritoneum and along the surface of bowel loops consistent with known metastatic disease of ovarian cancer, peritoneal carcinomatosis. The previous dilated loops of small bowel are decreasing today  in size. Less air-fluid levels but more small bowel stool appearance. No free air. Stable numerous other findings in the short time interval. Electronically Signed   By: Karen Kays M.D.   On: 02/12/2023 13:08   [place] ASCITES (ABDOMEN LIMITED) Result Date: 02/12/2023 CLINICAL DATA:  Ovarian CA, peritoneal carcinomatosis, concern for recurrent large volume of ascites, abdominal discomfort EXAM: LIMITED ABDOMEN ULTRASOUND FOR ASCITES TECHNIQUE: Limited ultrasound survey for ascites was performed in all four abdominal quadrants. COMPARISON:  02/12/2023 CT FINDINGS: Survey of the abdominal 4 quadrants performed. Only a small volume of scattered abdominopelvic ascites some of which is septated and complex. No  large volume of ascites demonstrated by ultrasound. Therapeutic paracentesis not performed. IMPRESSION: Small volume of abdominopelvic ascites. Electronically Signed   By: Judie Petit.  Shick M.D.   On: 02/12/2023 10:34   CT ABDOMEN PELVIS W CONTRAST Result Date: 02/09/2023 CLINICAL DATA:  Abdominal pain, nausea, and vomiting x2 months. Patient has ovarian cancer with metastases. Ultrasound therapeutic paracentesis x3 this month, last on 01/22/2023 with 600 mL clear yellow fluid removed. EXAM: CT ABDOMEN AND PELVIS WITH CONTRAST TECHNIQUE: Multidetector CT imaging of the abdomen and pelvis was performed using the standard protocol following bolus administration of intravenous contrast. RADIATION DOSE REDUCTION: This exam was performed according to the departmental dose-optimization program which includes automated exposure control, adjustment of the mA and/or kV according to patient size and/or use of iterative reconstruction technique. CONTRAST:  OMNIPAQUE IOHEXOL 300 MG/ML  SOLN COMPARISON:  Numerous prior CTs. The 2 most recent are a CT abdomen pelvis with contrast 12/24/2022 and CTA chest, abdomen and pelvis from 12/15/2022. FINDINGS: Lower chest: An infusion catheter again noted terminating about the superior cavoatrial junction. The cardiac size is normal. There is a small hiatal hernia. Mild elevation right hemidiaphragm with lung bases clear. Hepatobiliary: Moderate to severe hepatic steatosis is increased in the interval. There is no mass enhancement. Gallbladder is absent as before with stable prominent common bile duct measuring 11 mm. Pancreas: Partially atrophic. No focal abnormality. No inflammation. Spleen: Stable 7 cm splenic cystic lesion with patchy calcifications. Lobulated appearance of the spleen. No splenomegaly. Embolization coils again noted at the splenic hilum. No new abnormality. Adrenals/Urinary Tract: Adrenal glands are unremarkable. Kidneys are normal, without renal calculi, focal  lesion, or hydronephrosis. Bladder is unremarkable. Stomach/Bowel: Chronic thickened folds in the upper to mid stomach. Mild increase parent fluid distention. Increased dilatation in duodenum and proximal to mid small bowel up to 4.4 cm caliber. There are thickened folds in multiple dilated and nondilated segments. There is a thickened segment in the central abdomen best seen on 2: 34-37 which is believed to be the transitional segment causing intermediate grade obstruction. Decompressed segments are present downstream. This is probably mid ileal in location. An appendix is not seen. There is mild fecal stasis ascending and transverse colon. Rest of the large bowel is largely contracted. Vascular/Lymphatic: Aortic atherosclerosis. No enlarged abdominal or pelvic lymph nodes. Reproductive: Status post hysterectomy. No adnexal masses. Multiple pelvic phleboliths. Other: Mild-to-moderate free fluid, less than on 12/24/2022. There is enhancement of the peritoneal reflections consistent with peritoneal carcinomatosis, ranging from linear to nodular enhancement as before. No overt progression in this is seen. There are minimal layering presacral ascites as well. There is no free hemorrhage or free air.  No incarcerated hernia. Musculoskeletal: Left hip replacement. Grade 1 degenerative anterolisthesis L4-5 with advanced facet hypertrophy and acquired spinal stenosis. Osteopenia.  No regional bone metastasis is seen. IMPRESSION: 1. Intermediate  grade small-bowel obstruction with transitional segment in the central abdomen, probably mid ileal in location, due to a thickened segment. No pneumatosis. 2. Mild-to-moderate ascites, less than on 12/24/2022. 3. Peritoneal carcinomatosis findings without overt progression. 4. Increased moderate to severe hepatic steatosis. 5. Stable 7 cm splenic cystic lesion with patchy calcifications. 6. Aortic atherosclerosis. 7. Small hiatal hernia. 8. Osteopenia and degenerative change. Aortic  Atherosclerosis (ICD10-I70.0). Electronically Signed   By: Almira Bar M.D.   On: 02/09/2023 23:03   DG Chest Portable 1 View Result Date: 02/09/2023 CLINICAL DATA:  Shortness of breath with abdominal pain. EXAM: PORTABLE CHEST 1 VIEW COMPARISON:  Chest x-ray 11/12/2021 FINDINGS: Right chest port catheter tip projects over the distal SVC. The heart size and mediastinal contours are within normal limits. Both lungs are clear. The visualized skeletal structures are unremarkable. IMPRESSION: No active disease. Electronically Signed   By: Darliss Cheney M.D.   On: 02/09/2023 21:58       Discharge Exam: Vitals:   03/10/23 0355 03/10/23 0808  BP: (!) 88/55 (!) 95/51  Pulse: 89 100  Resp: 16 20  Temp: 97.6 F (36.4 C) 98.6 F (37 C)  SpO2: 96% 95%    General: Pt is alert, awake, not in acute distress Cardiovascular:  S1/S2 +, no edema Respiratory: CTA bilaterally, no wheezing, no rhonchi, no respiratory distress, no conversational dyspnea  Abdominal: Soft, NT, ND, bowel sounds + Extremities: no edema, no cyanosis Psych: Normal mood and affect, stable judgement and insight     The results of significant diagnostics from this hospitalization (including imaging, microbiology, ancillary and laboratory) are listed below for reference.     Microbiology: Recent Results (from the past 240 hours)  Gastrointestinal Panel by PCR , Stool     Status: None   Collection Time: 03/07/23  1:01 PM   Specimen: Stool  Result Value Ref Range Status   Campylobacter species NOT DETECTED NOT DETECTED Final   Plesimonas shigelloides NOT DETECTED NOT DETECTED Final   Salmonella species NOT DETECTED NOT DETECTED Final   Yersinia enterocolitica NOT DETECTED NOT DETECTED Final   Vibrio species NOT DETECTED NOT DETECTED Final   Vibrio cholerae NOT DETECTED NOT DETECTED Final   Enteroaggregative E coli (EAEC) NOT DETECTED NOT DETECTED Final   Enteropathogenic E coli (EPEC) NOT DETECTED NOT DETECTED Final    Enterotoxigenic E coli (ETEC) NOT DETECTED NOT DETECTED Final   Shiga like toxin producing E coli (STEC) NOT DETECTED NOT DETECTED Final   Shigella/Enteroinvasive E coli (EIEC) NOT DETECTED NOT DETECTED Final   Cryptosporidium NOT DETECTED NOT DETECTED Final   Cyclospora cayetanensis NOT DETECTED NOT DETECTED Final   Entamoeba histolytica NOT DETECTED NOT DETECTED Final   Giardia lamblia NOT DETECTED NOT DETECTED Final   Adenovirus F40/41 NOT DETECTED NOT DETECTED Final   Astrovirus NOT DETECTED NOT DETECTED Final   Norovirus GI/GII NOT DETECTED NOT DETECTED Final   Rotavirus A NOT DETECTED NOT DETECTED Final   Sapovirus (I, II, IV, and V) NOT DETECTED NOT DETECTED Final    Comment: Performed at [facility], [address] [place], [place] [address]  C Difficile Quick Screen w PCR reflex     Status: None   Collection Time: 03/08/23  6:22 PM   Specimen: STOOL  Result Value Ref Range Status   C Diff antigen NEGATIVE NEGATIVE Final   C Diff toxin NEGATIVE NEGATIVE Final   C Diff interpretation No C. difficile detected.  Final    Comment: Performed at  [facility], [address] [place], [place] [address]     Labs: BNP (last 3 results) No results for input(s): "BNP" in the last 8760 hours. Basic Metabolic Panel: Recent Labs  Lab 03/07/23 1301 03/08/23 0650 03/08/23 0903 03/09/23 0300 03/10/23 0346  NA 138 138 137 142 137  K 3.2* 3.8 3.8 4.2 3.6  CL 100 101 99 109 105  CO2 26 27 27 27 26   GLUCOSE 108* 77 78 84 88  BUN 10 8 9 14  5*  CREATININE 0.62 0.49 0.58 1.05* 0.64  CALCIUM 8.5* 8.2* 8.1* 8.4* 7.6*  MG  --  1.3* 1.3* 2.4  --   PHOS  --  3.4  --   --   --    Liver Function Tests: Recent Labs  Lab 03/07/23 1301 03/08/23 0650 03/09/23 0300  AST 68* 48* 52*  ALT 65* 50* 43  ALKPHOS 401* 303* 147*  BILITOT 1.1 0.8 0.9  PROT 6.1* 5.0* 4.5*  ALBUMIN 2.4* 1.9* 2.4*   No results for input(s): "LIPASE", "AMYLASE" in the last 168 hours. No results for  input(s): "AMMONIA" in the last 168 hours. CBC: Recent Labs  Lab 03/07/23 1301 03/07/23 2335 03/08/23 0650 03/08/23 1425 03/08/23 2216 03/09/23 0300 03/09/23 0410 03/10/23 0525  WBC 5.8  --  4.4  --   --  9.8  --  5.4  NEUTROABS  --   --  2.0  --   --   --   --   --   HGB 9.6*   < > 8.1* 6.4* 9.9* 9.3* 9.8* 10.2*  HCT 29.3*   < > 24.7* 19.8* 29.7* 29.4* 29.7* 30.9*  MCV 125.2*  --  124.7*  --   --  100.0  --  114.4*  PLT 63*  --  67*  --   --  176  --  120*   < > = values in this interval not displayed.   Cardiac Enzymes: No results for input(s): "CKTOTAL", "CKMB", "CKMBINDEX", "TROPONINI" in the last 168 hours. BNP: Invalid input(s): "POCBNP" CBG: No results for input(s): "GLUCAP" in the last 168 hours. D-Dimer No results for input(s): "DDIMER" in the last 72 hours. Hgb A1c No results for input(s): "HGBA1C" in the last 72 hours. Lipid Profile No results for input(s): "CHOL", "HDL", "LDLCALC", "TRIG", "CHOLHDL", "LDLDIRECT" in the last 72 hours. Thyroid function studies No results for input(s): "TSH", "T4TOTAL", "T3FREE", "THYROIDAB" in the last 72 hours.  Invalid input(s): "FREET3" Anemia work up No results for input(s): "VITAMINB12", "FOLATE", "FERRITIN", "TIBC", "IRON", "RETICCTPCT" in the last 72 hours. Urinalysis    Component Value Date/Time   COLORURINE YELLOW 02/09/2023 2333   APPEARANCEUR CLEAR 02/09/2023 2333   APPEARANCEUR Clear 08/02/2021 1036   LABSPEC >1.046 (H) 02/09/2023 2333   PHURINE 6.0 02/09/2023 2333   GLUCOSEU NEGATIVE 02/09/2023 2333   HGBUR NEGATIVE 02/09/2023 2333   BILIRUBINUR NEGATIVE 02/09/2023 2333   BILIRUBINUR Negative 08/02/2021 1036   KETONESUR NEGATIVE 02/09/2023 2333   PROTEINUR NEGATIVE 02/09/2023 2333   NITRITE NEGATIVE 02/09/2023 2333   LEUKOCYTESUR NEGATIVE 02/09/2023 2333   Sepsis Labs Recent Labs  Lab 03/07/23 1301 03/08/23 0650 03/09/23 0300 03/10/23 0525  WBC 5.8 4.4 9.8 5.4   Microbiology Recent Results (from  the past 240 hours)  Gastrointestinal Panel by PCR , Stool     Status: None   Collection Time: 03/07/23  1:01 PM   Specimen: Stool  Result Value Ref Range Status   Campylobacter species NOT DETECTED  NOT DETECTED Final   Plesimonas shigelloides NOT DETECTED NOT DETECTED Final   Salmonella species NOT DETECTED NOT DETECTED Final   Yersinia enterocolitica NOT DETECTED NOT DETECTED Final   Vibrio species NOT DETECTED NOT DETECTED Final   Vibrio cholerae NOT DETECTED NOT DETECTED Final   Enteroaggregative E coli (EAEC) NOT DETECTED NOT DETECTED Final   Enteropathogenic E coli (EPEC) NOT DETECTED NOT DETECTED Final   Enterotoxigenic E coli (ETEC) NOT DETECTED NOT DETECTED Final   Shiga like toxin producing E coli (STEC) NOT DETECTED NOT DETECTED Final   Shigella/Enteroinvasive E coli (EIEC) NOT DETECTED NOT DETECTED Final   Cryptosporidium NOT DETECTED NOT DETECTED Final   Cyclospora cayetanensis NOT DETECTED NOT DETECTED Final   Entamoeba histolytica NOT DETECTED NOT DETECTED Final   Giardia lamblia NOT DETECTED NOT DETECTED Final   Adenovirus F40/41 NOT DETECTED NOT DETECTED Final   Astrovirus NOT DETECTED NOT DETECTED Final   Norovirus GI/GII NOT DETECTED NOT DETECTED Final   Rotavirus A NOT DETECTED NOT DETECTED Final   Sapovirus (I, II, IV, and V) NOT DETECTED NOT DETECTED Final    Comment: Performed at [facility], [address] [place], [place] [address]  C Difficile Quick Screen w PCR reflex     Status: None   Collection Time: 03/08/23  6:22 PM   Specimen: STOOL  Result Value Ref Range Status   C Diff antigen NEGATIVE NEGATIVE Final   C Diff toxin NEGATIVE NEGATIVE Final   C Diff interpretation No C. difficile detected.  Final    Comment: Performed at [facility], [address], [place], [place] [address]     Patient was seen and examined on the day of discharge and was found to be in stable condition. Time coordinating discharge: 35 minutes including  assessment and coordination of care, as well as examination of the patient.   SIGNED:  Noralee Stain, DO [facility] 03/10/2023, 11:05 AM

## 2023-03-10 NOTE — Progress Notes (Signed)
 D/C tele. Went over AVS with pt and all questions were addressed.   Lavenia Atlas, RN

## 2023-03-11 ENCOUNTER — Inpatient Hospital Stay: Payer: 59

## 2023-03-11 ENCOUNTER — Other Ambulatory Visit: Payer: 59

## 2023-03-11 ENCOUNTER — Encounter (HOSPITAL_COMMUNITY): Payer: Self-pay | Admitting: Internal Medicine

## 2023-03-11 ENCOUNTER — Inpatient Hospital Stay: Payer: 59 | Admitting: Hematology

## 2023-03-17 NOTE — Progress Notes (Incomplete)
 49  [facility] [address] [place] [address]    Clinic Day:  03/17/23   Referring physician: Ignatius Specking, MD  Patient Care Team: Ignatius Specking, MD as PCP - General (Internal Medicine) Doreatha Massed, MD as Medical Oncologist (Oncology)   ASSESSMENT & PLAN:   Assessment: 1.  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 -Robotic assisted laparoscopic TAH and BSO and omentectomy on 12/24/2018, pathology showing high-grade serous carcinoma, PT3P NX. -Germline mutation testing was negative. -3 more cycles of adjuvant chemotherapy completed on 03/15/2019. -CTAP on 04/13/2019 showed no findings of active malignancy.  28% reduction in the volume of presumed chronic hematoma/chronic fluid collection splaying the upper margin of the spleen.  Large type III hiatal hernia. -CTAP on 10/13/2019 shows no findings of recurrence or metastatic disease. - Foundation 1 shows HRD+, LOH score>16%.  MSI-stable.  MYC amplification.  T p53 mutation. - We reviewed CT CAP from 02/21/2021 which showed progressive peritoneal metastasis compared to 12/06/2020 scan.  No bowel obstruction.  Small right pleural effusion with mild enlargement of an isolated left external iliac lymph node. - Reviewed EGD from 02/28/2021 which showed hiatal hernia and normal findings. - She reported epigastric and left upper quadrant pain worse in the last 1 week which is related to progression of her malignancy. - 6 cycles of carboplatin and paclitaxel from 03/13/2021 through 06/18/2021 - CT CAP (07/12/2021): No evidence of recurrence or metastatic disease. - Olaparib 300 mg twice daily from 08/22/2021 through 11/28/2022 with progression - Cycle 1 carboplatin, gemcitabine and bevacizumab on 01/07/2023    Plan: 1.  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 - She was recently hospitalized with partial small bowel obstruction which improved with conservative measures. - Overall her clinical condition has slightly improved after first cycle of treatment.  We have discussed continuation of treatment versus best supportive care in the form of hospice.  Patient is requesting to continue treatments.  She has not vomited since discharge from the hospital. - Reviewed labs today: AST and ALT are mildly elevated at 65 and 46 respectively.  Total bilirubin is 1.4.  Creatinine is normal.  CBC is grossly normal with leukocytosis, likely reactive. - She may proceed with cycle 2-day 1 today.  Will obtain CA125 level today.   2.  Lower back/left upper quadrant pain: - Continue Dilaudid 8 mg tablet once in the morning and evening and half tablet in between as needed.  She is off of OxyContin.   3.  Numbness in the feet/cramping in the hands: - She is not taking gabapentin at this time.  4.  Iron deficiency state: - Last Feraheme was in June 2024.  Hemoglobin today is 10.8.  5.  Constipation: - She is not taking stool softeners at this time and has about 4-5 bowel movements per day, soft.  6.  Hypomagnesemia: - Magnesium is normal today.  Continue magnesium twice daily.  Addendum: Patient has received bevacizumab followed by gemcitabine which completed at 12:30 PM.  At 12:43 PM, carboplatin was started.  7 minutes after infusion, she developed difficulty breathing and redness of the face.  She was given 125 mg of Solu-Medrol and 25 mg of Benadryl.  Her breathing has improved  and her vitals returned to normal.  Likely reaction to carboplatin.  She will come back next week to receive day 8 of gemcitabine.  We have to look into alternatives for her cycle 3.    No orders of the defined types were placed in  this encounter.    Alben Deeds Teague,acting as a Neurosurgeon for Doreatha Massed, MD.,have documented all relevant documentation on the behalf of Doreatha Massed, MD,as directed by  Doreatha Massed, MD while in the presence of Doreatha Massed, MD.  ***     [place] R Teague   3/3/20259:30 AM  CHIEF COMPLAINT:   Diagnosis: high-grade serous ovarian cancer    Cancer Staging  No matching staging information was found for the patient.    Prior Therapy: 1. Carboplatin and paclitaxel x 7 cycles from 08/24/2018 to 03/15/2019. 2. Laparoscopic TAH & BSO & omenectomy on 12/24/2018. 3.  6 cycles of carboplatin and paclitaxel from 03/05/2021 to 06/18/2021  Current Therapy:  olaparib    HISTORY OF PRESENT ILLNESS:   Oncology History  Ovarian cancer, bilateral (HCC)  07/07/2018 Pathology Results   PLEURAL FLUID, LEFT (SPECIMEN 1 OF 1, COLLECTED 07/07/18): - MALIGNANT CELLS CONSISTENT WITH ADENOCARCINOMA - SEE COMMENT  Source Pleural Fluid, (Specimen 1 of 1, collected on 07/07/2018) Gross Specimen: Received is/are 1000cc of bloody red fluid with tissue. (TC:tc) Prepared: # Smears: 0 # Concentration Technique Slides (i.e. ThinPrep): 1 # Cell Block: 1 Conventional Additional Studies: Two Hematology slides labeled [id number] Comment The malignant cells are positive for cytokeratin 7, p53, WT-1, Pax-8, Moc31, ER (weak) and EMA but negative for cytokeratin 20, TTF-1, GATA-3, CDX-2 and D2-40. Overall, the phenotype is consistent with a gynecologic primary; clinical correlation recommended.   07/07/2018 Procedure   Successful ultrasound guided left thoracentesis yielding 2.0 L of pleural fluid   07/08/2018 Procedure   1. Technically successful placement of left 14 French pigtail chest drain, placed to Pleur-evac water-seal.   07/08/2018 Procedure   1. Technically successful five [place] double lumen power injectable PICC placement   07/09/2018 Imaging   Ct chest 1. There is a  moderate, loculated left hydropneumothorax with a small air component and moderate fluid component. The largest loculated component is located posteriorly. There is a pigtail drainage catheter about the lateral pleural space. There is no obvious etiology, such as obvious mass or pleural disease.   2. There is a small right pleural effusion with associated atelectasis or consolidation and a subpleural consolidation of the superior segment right lower lobe (series 4, image 56), of uncertain significance, possibly infectious or inflammatory   07/10/2018 Imaging   Ct abdomen and pelvis: 1. The bilateral ovaries are enlarged by heterogeneous appearing cystic lesions, measuring 5.3 x 4.2 cm on the right (series 4, image 72) and 4.5 x 3.2 cm on the left (series 4, image 75). Consider dedicated pelvic ultrasound and/or pelvic MRI to further evaluate for solid components given high suspicion for GYN primary malignancy.   2. No other evidence of mass and no lymphadenopathy in the abdomen or pelvis.   3. Trace ascites. There is some suggestion of omental and peritoneal nodularity (e.g. Series 4, image 43), concerning for peritoneal metastatic disease.    4. Loculated left-sided pleural effusion with left-sided pleural drainage catheter in position. Small right pleural effusion   07/13/2018 Surgery   OPERATION: 1.  Left VATS (video-assisted thoracoscopic surgery) for drainage of loculated pleural effusion. 2.  Talc pleurodesis for malignant pleural effusion. 3.  Placement of PleurX catheter for  management of malignant pleural effusion. 4.  Placement of On-Q analgesia catheter system.    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   07/13/2018 Pathology Results   Pleura, peel, Left Pleural - FIBRO-FIBRINOUS PLEURITIS -  NEGATIVE FOR MALIGNANCY   07/18/2018 Initial Diagnosis   Ovarian cancer, bilateral (HCC)   07/20/2018 Procedure   EGD impression: Normal proximal esophagus and mid esophagus. Mild distal esophageal rings; dilation not performed because of esophagitis. LA Grade C reflux esophagitis. Z-line regular, 30 cm from the incisors. 5 cm hiatal hernia. Non-bleeding gastric ulcer with no stigmata of bleeding. Gastritis. Duodenal erosions without bleeding. Normal second portion of the duodenum. No specimens collected.   08/19/2018 Genetic Testing   Negative genetic testing on the common hereditary cancer panel.  The Common Hereditary Gene Panel offered by [facility] includes sequencing and/or deletion duplication testing of the following 48 genes: APC, ATM, AXIN2, BARD1, BMPR1A, BRCA1, BRCA2, BRIP1, CDH1, CDK4, CDKN2A (p14ARF), CDKN2A (p16INK4a), CHEK2, CTNNA1, DICER1, EPCAM (Deletion/duplication testing only), GREM1 (promoter region deletion/duplication testing only), KIT, MEN1, MLH1, MSH2, MSH3, MSH6, MUTYH, NBN, NF1, NHTL1, PALB2, PDGFRA, PMS2, POLD1, POLE, PTEN, RAD50, RAD51C, RAD51D, RNF43, SDHB, SDHC, SDHD, SMAD4, SMARCA4. STK11, TP53, TSC1, TSC2, and VHL.  The following genes were evaluated for sequence changes only: SDHA and HOXB13 c.251G>A variant only. The report date is August 19, 2018.   08/24/2018 - 03/15/2019 Chemotherapy   Patient is on Treatment Plan : OVARIAN Carboplatin (AUC 6) / Paclitaxel (175) q21d x 6 cycles     03/05/2021 - 06/18/2021 Chemotherapy   Patient is on Treatment Plan : OVARIAN Carboplatin (AUC 6) / Paclitaxel (175) q21d x 6 cycles     01/07/2023 -  Chemotherapy   Patient is on Treatment Plan : OVARIAN RECURRENT 3RD LINE Carboplatin D1 + Gemcitabine D1,8 (4/800) q21d        INTERVAL HISTORY:   Michelle Holt is a 53 y.o. female presenting to clinic today for follow up of high-grade serous ovarian cancer. She was last seen by me on 02/18/23.  Since her last visit, she was admitted to the  hospital from 03/07/23 to 03/10/23 for a GI bleed. She was given 1 unit of PRBC's on 2/22 and  underwent an EGD and colonoscopy on 2/23 that found diverticulosis and internal hemorrhoids. Byron was also noted to have a left leg DVT and had an IVC filter placed on 2/22.   Her appetite level is at ***%. Her energy level is at ***%. She is accompanied by her family member.   PAST MEDICAL HISTORY:   Past Medical History: Past Medical History:  Diagnosis Date   Anemia    Anxiety and depression    Arthritis of facet joints at multiple vertebral levels    L5-S1   Constipation    Dyslipidemia    Family history of breast cancer    Family history of uterine cancer    GERD (gastroesophageal reflux disease)    History of hiatal hernia    History of kidney stones    Insomnia    Irritable bowel syndrome    Migraine    Muscle tension headache    Neuropathy of finger    Ovarian carcinoma (HCC)    ovarian   Plantar fasciitis of right foot    Port-A-Cath in place 08/20/2018    Surgical History: Past Surgical History:  Procedure  Laterality Date   ABDOMINAL HYSTERECTOMY     BIOPSY  10/27/2020   Procedure: BIOPSY;  Surgeon: Dolores Frame, MD;  Location: [facility];  Service: Gastroenterology;;   BIOPSY  03/09/2023   Procedure: BIOPSY;  Surgeon: Imogene Burn, MD;  Location: [facility] ENDOSCOPY;  Service: Gastroenterology;;   CHOLECYSTECTOMY  2008   COLONOSCOPY N/A 08/13/2013   Procedure: COLONOSCOPY;  Surgeon: Malissa Hippo, MD;  Location: [facility];  Service: Endoscopy;  Laterality: N/A;  230-moved to 145 Ann to notify pt   COLONOSCOPY WITH PROPOFOL N/A 11/15/2020   Procedure: COLONOSCOPY WITH PROPOFOL;  Surgeon: Malissa Hippo, MD;  Location: [facility];  Service: Endoscopy;  Laterality: N/A;  1:40   COLONOSCOPY WITH PROPOFOL N/A 03/09/2023   Procedure: COLONOSCOPY WITH PROPOFOL;  Surgeon: Imogene Burn, MD;  Location: [facility] ENDOSCOPY;  Service: Gastroenterology;  Laterality:  N/A;   ESOPHAGEAL DILATION N/A 02/28/2021   Procedure: ESOPHAGEAL DILATION;  Surgeon: Malissa Hippo, MD;  [facility]: [facility];  Service: Endoscopy;  Laterality: N/A;   ESOPHAGOGASTRODUODENOSCOPY     ESOPHAGOGASTRODUODENOSCOPY (EGD) WITH PROPOFOL N/A 07/20/2018   Procedure: ESOPHAGOGASTRODUODENOSCOPY (EGD) WITH PROPOFOL;  Surgeon: Malissa Hippo, MD;  [facility]: [facility];  Service: Endoscopy;  Laterality: N/A;  Possible esophageal dilation.   ESOPHAGOGASTRODUODENOSCOPY (EGD) WITH PROPOFOL N/A 10/27/2020   Procedure: ESOPHAGOGASTRODUODENOSCOPY (EGD) WITH PROPOFOL;  Surgeon: Dolores Frame, MD;  [facility];  Service: Gastroenterology;  Laterality: N/A;  2:10, pt knows to arrive at 10:15   ESOPHAGOGASTRODUODENOSCOPY (EGD) WITH PROPOFOL N/A 02/28/2021   Procedure: ESOPHAGOGASTRODUODENOSCOPY (EGD) WITH PROPOFOL;  Surgeon: Malissa Hippo, MD;  [facility];  Service: Endoscopy;  Laterality: N/A;  200 ASA 1   ESOPHAGOGASTRODUODENOSCOPY (EGD) WITH PROPOFOL N/A 03/29/2022   Procedure: ESOPHAGOGASTRODUODENOSCOPY (EGD) WITH PROPOFOL;  Surgeon: Dolores Frame, MD;  [facility];  Service: Gastroenterology;  Laterality: N/A;  2:00 pm, asa 1-2   ESOPHAGOGASTRODUODENOSCOPY (EGD) WITH PROPOFOL N/A 03/09/2023   Procedure: ESOPHAGOGASTRODUODENOSCOPY (EGD) WITH PROPOFOL;  Surgeon: Imogene Burn, MD;  [facility];  Service: Gastroenterology;  Laterality: N/A;   GIVENS CAPSULE STUDY N/A 11/24/2020   Procedure: GIVENS CAPSULE STUDY;  Surgeon: Malissa Hippo, MD;  [facility]: [facility];  Service: Endoscopy;  Laterality: N/A;  7:30   IR ANGIOGRAM SELECTIVE EACH ADDITIONAL VESSEL  08/01/2018   IR ANGIOGRAM VISCERAL SELECTIVE  08/01/2018   IR EMBO ART  VEN HEMORR LYMPH EXTRAV  INC GUIDE ROADMAPPING  08/01/2018   IR IMAGING GUIDED PORT INSERTION  08/20/2018   IR IVC FILTER PLMT / S&I /IMG GUID/MOD SED  03/08/2023   IR PERC PLEURAL DRAIN W/INDWELL  CATH W/IMG GUIDE  07/08/2018   IR THORACENTESIS ASP PLEURAL SPACE W/IMG GUIDE  07/07/2018   IR US GUIDE VASC ACCESS RIGHT  08/01/2018   PLEURAL EFFUSION DRAINAGE Left 07/13/2018   Procedure: DRAINAGE OF LOCULATED PLEURAL EFFUSION;  Surgeon: Kerin Perna, MD;  Location: [facility] OR;  Service: Thoracic;  Laterality: Left;   POLYPECTOMY  11/15/2020   Procedure: POLYPECTOMY;  Surgeon: Malissa Hippo, MD;  [facility];  Service: Endoscopy;;   REMOVAL OF PLEURAL DRAINAGE CATHETER Left 08/20/2018   Procedure: REMOVAL OF PLEURAL DRAINAGE CATHETER;  Surgeon: Kerin Perna, MD;  Location: [facility] OR;  Service: Thoracic;  Laterality: Left;   REMOVAL OF PLEURAL DRAINAGE CATHETER Left 08/20/2018   Procedure: REMOVAL OF PLEURAL DRAINAGE CATHETER;  Surgeon: Kerin Perna, MD;  Location: [facility] OR;  Service: Thoracic;  Laterality: Left;   TALC PLEURODESIS Left 07/13/2018   Procedure: Talc Pleuradesis;  Surgeon: Donata Clay, Theron Arista, MD;  Location: [facility] OR;  Service: Thoracic;  Laterality: Left;   TOTAL HIP ARTHROPLASTY Left 06/05/2020   Procedure: LEFT TOTAL HIP ARTHROPLASTY ANTERIOR APPROACH;  Surgeon: Tarry Kos, MD;  Location: [facility] OR;  Service: Orthopedics;  Laterality: Left;  3-C   TUBAL LIGATION Bilateral    UTERINE ABLATION     VIDEO ASSISTED THORACOSCOPY Left 07/13/2018   Procedure: VIDEO ASSISTED THORACOSCOPY;  Surgeon: Kerin Perna, MD;  Location: [facility] OR;  Service: Thoracic;  Laterality: Left;    Social History: Social History   Socioeconomic History   Marital status: Widowed    Spouse name: Not on file   Number of children: 2   Years of education: 2-College   Highest education level: Not on file  Occupational History    Employer: [employer]  Tobacco Use   Smoking status: Every Day    Current packs/day: 0.50    Average packs/day: 0.5 packs/day for 17.3 years (8.6 ttl pk-yrs)    Types: Cigarettes    Start date: 06/21/2001    Last attempt to quit: 06/22/2018   Smokeless tobacco: Never  Vaping Use    Vaping status: Never Used  Substance and Sexual Activity   Alcohol use: Not Currently    Alcohol/week: 1.0 standard drink of alcohol    Types: 1 Glasses of wine per week    Comment: occasionally   Drug use: No   Sexual activity: Not Currently  Other Topics Concern   Not on file  Social History Narrative   Patient lives at home with her daughter.    Patient has 2 children.    Patient is widowed.    Patient is right handed.    Patient has her Associates degree.      Social Drivers of Corporate investment banker Strain: Low Risk  (08/26/2022)   Received from [facility]   Overall Financial Resource Strain (CARDIA)    Difficulty of Paying Living Expenses: Not hard at all  Food Insecurity: No Food Insecurity (02/10/2023)   Hunger Vital Sign    Worried About Running Out of Food in the Last Year: Never true    Ran Out of Food in the Last Year: Never true  Transportation Needs: No Transportation Needs (02/10/2023)   PRAPARE - Administrator, Civil Service (Medical): No    Lack of Transportation (Non-Medical): No  Physical Activity: Inactive (07/22/2018)   Exercise Vital Sign    Days of Exercise per Week: 0 days    Minutes of Exercise per Session: 0 min  Stress: Stress Concern Present (07/22/2018)   [employer] of Occupational Health - Occupational Stress Questionnaire    Feeling of Stress : Very much  Social Connections: Moderately Isolated (07/22/2018)   Social Connection and Isolation Panel [NHANES]    Frequency of Communication with Friends and Family: More than three times a week    Frequency of Social Gatherings with Friends and Family: More than three times a week    Attends Religious Services: 1 to 4 times per year    Active Member of [employer] or Organizations: No    Attends Banker Meetings: Never    Marital Status: Widowed  Intimate Partner Violence: Not At Risk (02/10/2023)   Humiliation, Afraid, Rape, and Kick questionnaire     Fear of Current or Ex-Partner: No  Emotionally Abused: No    Physically Abused: No    Sexually Abused: No    Family History: Family History  Problem Relation Age of Onset   Depression Mother    Hypertension Mother    Obesity Mother    Diabetes Mother    Kidney disease Mother    Peripheral vascular disease Father    Atrial fibrillation Father    Crohn's disease Sister    Uterine cancer Sister 30       maternal half sister   COPD Brother    Osteoporosis Brother    Breast cancer Maternal Aunt 50   Colon cancer Neg Hx     Current Medications:  Current Outpatient Medications:    albuterol (VENTOLIN HFA) 108 (90 Base) MCG/ACT inhaler, Inhale 2 puffs into the lungs every 6 (six) hours as needed for wheezing or shortness of breath., Disp: , Rfl:    diazepam (VALIUM) 5 MG tablet, Take 1 tablet (5 mg total) by mouth at bedtime., Disp: 30 tablet, Rfl: 5   dicyclomine (BENTYL) 20 MG tablet, Take 1 tablet (20 mg total) by mouth every 6 (six) hours as needed for spasms., Disp: 120 tablet, Rfl: 1   HYDROmorphone (DILAUDID) 8 MG tablet, Take 1 tablet (8 mg total) by mouth every 4 (four) hours as needed (Breakthrough Pain)., Disp: 180 tablet, Rfl: 0   LORazepam (ATIVAN) 0.5 MG tablet, Take 0.5 mg by mouth 3 (three) times daily as needed for anxiety., Disp: , Rfl:    ondansetron (ZOFRAN-ODT) 4 MG disintegrating tablet, Take 1 tablet (4 mg total) by mouth every 8 (eight) hours as needed for nausea or vomiting., Disp: 90 tablet, Rfl: 2   pantoprazole (PROTONIX) 40 MG tablet, Take 1 tablet (40 mg total) by mouth 2 (two) times daily before a meal., Disp: 60 tablet, Rfl: 2 No current facility-administered medications for this visit.  Facility-Administered Medications Ordered in Other Visits:    sodium chloride flush (NS) 0.9 % injection 10 mL, 10 mL, Intravenous, PRN, Doreatha Massed, MD, 10 mL at 10/23/20 1544   Allergies: Allergies  Allergen Reactions   Carboplatin Shortness Of Breath     SOB and flushing of the skin.   Morphine And Codeine Itching   Nickel Itching   Nortriptyline Other (See Comments)    Significant weight gain   Topamax [Topiramate] Diarrhea and Nausea Only   Xanax [Alprazolam] Other (See Comments)    "Can't wake up"   Actifed Cold-Allergy [Chlorpheniramine-Phenyleph Er] Rash and Other (See Comments)    Red dye only   Amoxicillin Rash   Codeine Hives   Erythromycin Rash   Gemzar [Gemcitabine] Other (See Comments)    Feeling flushed, red in the face   Penicillins Rash        Red Dye Rash   Sudafed [Pseudoephedrine Hcl] Rash and Other (See Comments)    Red dye only    REVIEW OF SYSTEMS:   Review of Systems  Constitutional:  Negative for chills, fatigue and fever.  HENT:   Negative for lump/mass, mouth sores, nosebleeds, sore throat and trouble swallowing.   Eyes:  Negative for eye problems.  Respiratory:  Negative for cough and shortness of breath.   Cardiovascular:  Negative for chest pain, leg swelling and palpitations.  Gastrointestinal:  Negative for abdominal pain, constipation, diarrhea, nausea and vomiting.  Genitourinary:  Negative for bladder incontinence, difficulty urinating, dysuria, frequency, hematuria and nocturia.   Musculoskeletal:  Negative for arthralgias, back pain, flank pain, myalgias and neck pain.  Skin:  Negative for itching and rash.  Neurological:  Negative for dizziness, headaches and numbness.  Hematological:  Does not bruise/bleed easily.  Psychiatric/Behavioral:  Negative for depression, sleep disturbance and suicidal ideas. The patient is not nervous/anxious.   All other systems reviewed and are negative.    VITALS:   There were no vitals taken for this visit.  Wt Readings from Last 3 Encounters:  02/27/23 158 lb (71.7 kg)  02/18/23 172 lb 1.6 oz (78.1 kg)  02/10/23 168 lb 3.4 oz (76.3 kg)    There is no height or weight on file to calculate BMI.  Performance status (ECOG): 1 - Symptomatic but  completely ambulatory  PHYSICAL EXAM:   Physical Exam Vitals and nursing note reviewed. Exam conducted with a chaperone present.  Constitutional:      Appearance: Normal appearance.  Cardiovascular:     Rate and Rhythm: Normal rate and regular rhythm.     Pulses: Normal pulses.     Heart sounds: Normal heart sounds.  Pulmonary:     Effort: Pulmonary effort is normal.     Breath sounds: Normal breath sounds.  Abdominal:     Palpations: Abdomen is soft. There is no hepatomegaly, splenomegaly or mass.     Tenderness: There is no abdominal tenderness.  Musculoskeletal:     Right lower leg: No edema.     Left lower leg: No edema.  Lymphadenopathy:     Cervical: No cervical adenopathy.     Right cervical: No superficial, deep or posterior cervical adenopathy.    Left cervical: No superficial, deep or posterior cervical adenopathy.     Upper Body:     Right upper body: No supraclavicular or axillary adenopathy.     Left upper body: No supraclavicular or axillary adenopathy.  Neurological:     General: No focal deficit present.     Mental Status: She is alert and oriented to person, place, and time.  Psychiatric:        Mood and Affect: Mood normal.        Behavior: Behavior normal.     LABS:      Latest Ref Rng & Units 03/10/2023    5:25 AM 03/09/2023    4:10 AM 03/09/2023    3:00 AM  CBC  WBC 4.0 - 10.5 K/uL 5.4   9.8   Hemoglobin 12.0 - 15.0 g/dL 16.1  9.8  9.3   Hematocrit 36.0 - 46.0 % 30.9  29.7  29.4   Platelets 150 - 400 K/uL 120   176       Latest Ref Rng & Units 03/10/2023    3:46 AM 03/09/2023    3:00 AM 03/08/2023    9:03 AM  CMP  Glucose 70 - 99 mg/dL 88  84  78   BUN 6 - 20 mg/dL 5  14  9    Creatinine 0.44 - 1.00 mg/dL 0.96  0.45  4.09   Sodium 135 - 145 mmol/L 137  142  137   Potassium 3.5 - 5.1 mmol/L 3.6  4.2  3.8   Chloride 98 - 111 mmol/L 105  109  99   CO2 22 - 32 mmol/L 26  27  27    Calcium 8.9 - 10.3 mg/dL 7.6  8.4  8.1   Total Protein 6.5 - 8.1  g/dL  4.5    Total Bilirubin 0.0 - 1.2 mg/dL  0.9    Alkaline Phos 38 - 126 U/L  147    AST  15 - 41 U/L  52    ALT 0 - 44 U/L  43       No results found for: "CEA1", "CEA" / No results found for: "CEA1", "CEA" No results found for: "PSA1" No results found for: "CAN199" Lab Results  Component Value Date   CAN125 476.0 (H) 02/18/2023    No results found for: "TOTALPROTELP", "ALBUMINELP", "A1GS", "A2GS", "BETS", "BETA2SER", "GAMS", "MSPIKE", "SPEI" Lab Results  Component Value Date   TIBC 288 11/12/2022   TIBC 329 08/05/2022   TIBC 346 05/27/2022   FERRITIN 206 11/12/2022   FERRITIN 219 08/05/2022   FERRITIN 67 05/27/2022   IRONPCTSAT 17 11/12/2022   IRONPCTSAT 29 08/05/2022   IRONPCTSAT 14 05/27/2022   Lab Results  Component Value Date   LDH 277 (H) 07/18/2018   LDH 193 (H) 07/07/2018     STUDIES:   IR IVC FILTER PLMT / S&I Lenise [facility] GUID/MOD SED Result Date: 03/08/2023 CLINICAL DATA:  Left lower extremity DVT. GI bleed, a relative contraindication to anticoagulation. Caval filtration requested. EXAM: INFERIOR VENACAVOGRAM IVC FILTER PLACEMENT UNDER FLUOROSCOPY FLUOROSCOPY: Radiation Exposure Index (as provided by the fluoroscopic device): 22 mGy air Kerma TECHNIQUE: Patency of the right IJ vein was confirmed with ultrasound with image documentation. An appropriate skin site was determined. Skin site was marked, prepped with chlorhexidine, and draped using maximum barrier technique. The region was infiltrated locally with 1% lidocaine. Intravenous Fentanyl and Versed 1mg  were administered by RN during a total moderate (conscious) sedation time of 11 minutes; the patient's level of consciousness and physiological / cardiorespiratory status were monitored continuously by radiology RN under my direct supervision. Under real-time ultrasound guidance, the right IJ vein was accessed with a 21 gauge micropuncture needle; the needle tip within the vein was confirmed with ultrasound  image documentation. The needle was exchanged over a 018 guidewire for a transitional dilator, which allow advancement of the [facility] wire into the IVC. A long 6 French vascular sheath was placed for inferior venacavography. This demonstrated no caval thrombus. Renal vein inflows were evident. The Cook Celect IVC filter was advanced through the sheath and successfully deployed under fluoroscopy at the L2-3 level. Followup cavagram demonstrates stable filter position and no evident complication. The sheath was removed and hemostasis achieved at the site. No immediate complication. IMPRESSION: 1. Normal IVC. No thrombus or significant anatomic variation. 2. Technically successful infrarenal IVC filter placement. This is a retrievable model. PLAN: This IVC filter is potentially retrievable. The patient will be assessed for filter retrieval by Interventional Radiology in approximately 8-12 weeks. Further recommendations regarding filter retrieval, continued surveillance or declaration of device permanence, will be made at that time. Electronically Signed   By: Corlis Leak M.D.   On: 03/08/2023 21:47   CT ANGIO GI BLEED Result Date: 03/08/2023 CLINICAL DATA:  GI bleed. Concern for gas distal bleeding. Same day IVC filter placed. Ovarian carcinoma. * Tracking Code: BO * EXAM: CTA ABDOMEN AND PELVIS WITHOUT AND WITH CONTRAST TECHNIQUE: Multidetector CT imaging of the abdomen and pelvis was performed using the standard protocol during bolus administration of intravenous contrast. Multiplanar reconstructed images and MIPs were obtained and reviewed to evaluate the vascular anatomy. RADIATION DOSE REDUCTION: This exam was performed according to the departmental dose-optimization program which includes automated exposure control, adjustment of the mA and/or kV according to patient size and/or use of iterative reconstruction technique. CONTRAST:  75mL OMNIPAQUE IOHEXOL 350 MG/ML SOLN, 75mL OMNIPAQUE IOHEXOL 350 MG/ML SOLN  COMPARISON:  CT  02/12/2023 FINDINGS: VASCULAR Aorta: Normal caliber aorta without aneurysm, dissection, vasculitis or significant stenosis. Celiac: Patent without evidence of aneurysm, dissection, vasculitis or significant stenosis. SMA: Patent without evidence of aneurysm, dissection, vasculitis or significant stenosis. Renals: Both renal arteries are patent without evidence of aneurysm, dissection, vasculitis, fibromuscular dysplasia or significant stenosis. IMA: Patent without evidence of aneurysm, dissection, vasculitis or significant stenosis. Inflow: Patent without evidence of aneurysm, dissection, vasculitis or significant stenosis. Veins: Infrarenal IVC filter noted. Review of the MIP images confirms the above findings. NON-VASCULAR Lower chest: Lung bases are clear. Hepatobiliary: Hepatic steatosis.  Postcholecystectomy. Pancreas: Pancreas is normal. No ductal dilatation. No pancreatic inflammation. 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 No interval change. Adrenals/urinary tract: Adrenal glands and kidneys are normal. The ureters and bladder normal. Stomach/Bowel: Small hiatal hernia. No intraluminal IV contrast to localize active gastrointestinal bleeding within the small bowel or colon. There are fluid collections within leaves of the mesentery. Several these collections have a thin thickened rim. For example in the LEFT lower quadrant along the pericolic gutter thickened peritoneal measures 3 mm image 46/4. This is not changed from comparison CT. A similar collection in the anterior pelvis on image 64/16. Vascular/Lymphatic: Abdominal aorta is normal caliber. No periportal or retroperitoneal adenopathy. No pelvic adenopathy. Reproductive: Post hysterectomy.  Adnexa unremarkable Other: Peritoneal fluid collections as above. Musculoskeletal: No aggressive osseous lesion. IMPRESSION: VASCULAR 1. No significant arterial vascular abnormality. 2. Infrarenal IVC filter noted.  NON-VASCULAR 1. No evidence of active gastrointestinal bleeding within the small bowel or colon. 2. Again demonstrated peritoneal fluid collections in the abdomen pelvis with thickened peritoneal rim. Findings suggest peritoneal carcinomatosis versus peritonitis. Favor peritoneal metastasis in patient with ovarian carcinoma. No interval change from CT 02/12/2023. 3. Hepatic steatosis Electronically Signed   By: Genevive Bi M.D.   On: 03/08/2023 15:23   US Venous Img Lower Unilateral Left Result Date: 03/07/2023 CLINICAL DATA:  Left calf swelling EXAM: LEFT LOWER EXTREMITY VENOUS DOPPLER ULTRASOUND TECHNIQUE: Gray-scale sonography with graded compression, as well as color Doppler and duplex ultrasound were performed to evaluate the lower extremity deep venous systems from the level of the common femoral vein and including the common femoral, femoral, profunda femoral, popliteal and calf veins including the posterior tibial, peroneal and gastrocnemius veins when visible. The superficial great saphenous vein was also interrogated. Spectral Doppler was utilized to evaluate flow at rest and with distal augmentation maneuvers in the common femoral, femoral and popliteal veins. COMPARISON:  None Available. FINDINGS: Contralateral Common Femoral Vein: Respiratory phasicity is normal and symmetric with the symptomatic side. No evidence of thrombus. Normal compressibility. Common Femoral Vein: No evidence of thrombus. Normal compressibility, respiratory phasicity and response to augmentation. Saphenofemoral Junction: No evidence of thrombus. Normal compressibility and flow on color Doppler imaging. Profunda Femoral Vein: No evidence of thrombus. Normal compressibility and flow on color Doppler imaging. Femoral Vein: Femoral vein is patent and unremarkable in the upper thigh. However, the vessel becomes noncompressible in the mid thigh. No evidence of color flow on color Doppler imaging. Findings are consistent with  occlusive thrombus. Occlusive thrombus extends through the distal. Popliteal Vein: Occlusive thrombus extends into the popliteal in pain. Calf Veins: Occlusive thrombus extends into the calf veins. Superficial Great Saphenous Vein: No evidence of thrombus. Normal compressibility. Venous Reflux:  None. Other Findings:  None. IMPRESSION: Positive for acute occlusive DVT in the left lower extremity from the femoral vein in the mid thigh through the popliteal vein and into the  proximal calf veins. Electronically Signed   By: Malachy Moan M.D.   On: 03/07/2023 13:30

## 2023-03-18 ENCOUNTER — Inpatient Hospital Stay: Payer: 59 | Admitting: Hematology

## 2023-03-18 ENCOUNTER — Inpatient Hospital Stay: Payer: 59

## 2023-03-18 ENCOUNTER — Inpatient Hospital Stay: Payer: 59 | Attending: Hematology

## 2023-03-18 ENCOUNTER — Other Ambulatory Visit: Payer: 59

## 2023-03-19 NOTE — Progress Notes (Incomplete)
 49  [facility] [address], [place] [address]    Clinic Day:  03/19/23   Referring physician: Ignatius Specking, MD  Patient Care Team: Ignatius Specking, MD as PCP - General (Internal Medicine) Doreatha Massed, MD as Medical Oncologist (Oncology)   ASSESSMENT & PLAN:   Assessment: 1.  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 -Robotic assisted laparoscopic TAH and BSO and omentectomy on 12/24/2018, pathology showing high-grade serous carcinoma, PT3P NX. -Germline mutation testing was negative. -3 more cycles of adjuvant chemotherapy completed on 03/15/2019. -CTAP on 04/13/2019 showed no findings of active malignancy.  28% reduction in the volume of presumed chronic hematoma/chronic fluid collection splaying the upper margin of the spleen.  Large type III hiatal hernia. -CTAP on 10/13/2019 shows no findings of recurrence or metastatic disease. - Foundation 1 shows HRD+, LOH score>16%.  MSI-stable.  MYC amplification.  T p53 mutation. - We reviewed CT CAP from 02/21/2021 which showed progressive peritoneal metastasis compared to 12/06/2020 scan.  No bowel obstruction.  Small right pleural effusion with mild enlargement of an isolated left external iliac lymph node. - Reviewed EGD from 02/28/2021 which showed hiatal hernia and normal findings. - She reported epigastric and left upper quadrant pain worse in the last 1 week which is related to progression of her malignancy. - 6 cycles of carboplatin and paclitaxel from 03/13/2021 through 06/18/2021 - CT CAP (07/12/2021): No evidence of recurrence or metastatic disease. - Olaparib 300 mg twice daily from 08/22/2021 through 11/28/2022 with progression - Cycle 1 carboplatin, gemcitabine and bevacizumab on 01/07/2023    Plan: 1.  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 - She was recently hospitalized with partial small bowel obstruction which improved with conservative measures. - Overall her clinical condition has slightly improved after first cycle of treatment.  We have discussed continuation of treatment versus best supportive care in the form of hospice.  Patient is requesting to continue treatments.  She has not vomited since discharge from the hospital. - Reviewed labs today: AST and ALT are mildly elevated at 65 and 46 respectively.  Total bilirubin is 1.4.  Creatinine is normal.  CBC is grossly normal with leukocytosis, likely reactive. - She may proceed with cycle 2-day 1 today.  Will obtain CA125 level today.   2.  Lower back/left upper quadrant pain: - Continue Dilaudid 8 mg tablet once in the morning and evening and half tablet in between as needed.  She is off of OxyContin.   3.  Numbness in the feet/cramping in the hands: - She is not taking gabapentin at this time.  4.  Iron deficiency state: - Last Feraheme was in June 2024.  Hemoglobin today is 10.8.  5.  Constipation: - She is not taking stool softeners at this time and has about 4-5 bowel movements per day, soft.  6.  Hypomagnesemia: - Magnesium is normal today.  Continue magnesium twice daily.  Addendum: Patient has received bevacizumab followed by gemcitabine which completed at 12:30 PM.  At 12:43 PM, carboplatin was started.  7 minutes after infusion, she developed difficulty breathing and redness of the face.  She was given 125 mg of Solu-Medrol and 25 mg of Benadryl.  Her breathing has improved  and her vitals returned to normal.  Likely reaction to carboplatin.  She will come back next week to receive day 8 of gemcitabine.  We have to look into alternatives for her cycle 3.    No orders of the defined types were placed in  this encounter.     Alben Deeds Teague,acting as a Neurosurgeon for Doreatha Massed, MD.,have documented all relevant documentation on the behalf of Doreatha Massed, MD,as directed by  Doreatha Massed, MD while in the presence of Doreatha Massed, MD.  ***    [place] R Teague   3/5/20253:31 PM  CHIEF COMPLAINT:   Diagnosis: high-grade serous ovarian cancer    Cancer Staging  No matching staging information was found for the patient.    Prior Therapy: 1. Carboplatin and paclitaxel x 7 cycles from 08/24/2018 to 03/15/2019. 2. Laparoscopic TAH & BSO & omenectomy on 12/24/2018. 3.  6 cycles of carboplatin and paclitaxel from 03/05/2021 to 06/18/2021  Current Therapy:  olaparib    HISTORY OF PRESENT ILLNESS:   Oncology History  Ovarian cancer, bilateral (HCC)  07/07/2018 Pathology Results   PLEURAL FLUID, LEFT (SPECIMEN 1 OF 1, COLLECTED 07/07/18): - MALIGNANT CELLS CONSISTENT WITH ADENOCARCINOMA - SEE COMMENT  Source Pleural Fluid, (Specimen 1 of 1, collected on 07/07/2018) Gross Specimen: Received is/are 1000cc of bloody red fluid with tissue. (TC:tc) Prepared: # Smears: 0 # Concentration Technique Slides (i.e. ThinPrep): 1 # Cell Block: 1 Conventional Additional Studies: Two Hematology slides labeled [id number] Comment The malignant cells are positive for cytokeratin 7, p53, WT-1, Pax-8, Moc31, ER (weak) and EMA but negative for cytokeratin 20, TTF-1, GATA-3, CDX-2 and D2-40. Overall, the phenotype is consistent with a gynecologic primary; clinical correlation recommended.   07/07/2018 Procedure   Successful ultrasound guided left thoracentesis yielding 2.0 L of pleural fluid   07/08/2018 Procedure   1. Technically successful placement of left 14 French pigtail chest drain, placed to Pleur-evac water-seal.   07/08/2018 Procedure   1. Technically successful five [place] double lumen power injectable PICC placement   07/09/2018 Imaging   Ct chest 1. There is a  moderate, loculated left hydropneumothorax with a small air component and moderate fluid component. The largest loculated component is located posteriorly. There is a pigtail drainage catheter about the lateral pleural space. There is no obvious etiology, such as obvious mass or pleural disease.   2. There is a small right pleural effusion with associated atelectasis or consolidation and a subpleural consolidation of the superior segment right lower lobe (series 4, image 56), of uncertain significance, possibly infectious or inflammatory   07/10/2018 Imaging   Ct abdomen and pelvis: 1. The bilateral ovaries are enlarged by heterogeneous appearing cystic lesions, measuring 5.3 x 4.2 cm on the right (series 4, image 72) and 4.5 x 3.2 cm on the left (series 4, image 75). Consider dedicated pelvic ultrasound and/or pelvic MRI to further evaluate for solid components given high suspicion for GYN primary malignancy.   2. No other evidence of mass and no lymphadenopathy in the abdomen or pelvis.   3. Trace ascites. There is some suggestion of omental and peritoneal nodularity (e.g. Series 4, image 43), concerning for peritoneal metastatic disease.    4. Loculated left-sided pleural effusion with left-sided pleural drainage catheter in position. Small right pleural effusion   07/13/2018 Surgery   OPERATION: 1.  Left VATS (video-assisted thoracoscopic surgery) for drainage of loculated pleural effusion. 2.  Talc pleurodesis for malignant pleural effusion. 3.  Placement of PleurX catheter for  management of malignant pleural effusion. 4.  Placement of On-Q analgesia catheter system.    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   07/13/2018 Pathology Results   Pleura, peel, Left Pleural - FIBRO-FIBRINOUS PLEURITIS -  NEGATIVE FOR MALIGNANCY   07/18/2018 Initial Diagnosis   Ovarian cancer, bilateral (HCC)   07/20/2018 Procedure   EGD impression: Normal proximal esophagus and mid esophagus. Mild distal esophageal rings; dilation not performed because of esophagitis. LA Grade C reflux esophagitis. Z-line regular, 30 cm from the incisors. 5 cm hiatal hernia. Non-bleeding gastric ulcer with no stigmata of bleeding. Gastritis. Duodenal erosions without bleeding. Normal second portion of the duodenum. No specimens collected.   08/19/2018 Genetic Testing   Negative genetic testing on the common hereditary cancer panel.  The Common Hereditary Gene Panel offered by [facility] includes sequencing and/or deletion duplication testing of the following 48 genes: APC, ATM, AXIN2, BARD1, BMPR1A, BRCA1, BRCA2, BRIP1, CDH1, CDK4, CDKN2A (p14ARF), CDKN2A (p16INK4a), CHEK2, CTNNA1, DICER1, EPCAM (Deletion/duplication testing only), GREM1 (promoter region deletion/duplication testing only), KIT, MEN1, MLH1, MSH2, MSH3, MSH6, MUTYH, NBN, NF1, NHTL1, PALB2, PDGFRA, PMS2, POLD1, POLE, PTEN, RAD50, RAD51C, RAD51D, RNF43, SDHB, SDHC, SDHD, SMAD4, SMARCA4. STK11, TP53, TSC1, TSC2, and VHL.  The following genes were evaluated for sequence changes only: SDHA and HOXB13 c.251G>A variant only. The report date is August 19, 2018.   08/24/2018 - 03/15/2019 Chemotherapy   Patient is on Treatment Plan : OVARIAN Carboplatin (AUC 6) / Paclitaxel (175) q21d x 6 cycles     03/05/2021 - 06/18/2021 Chemotherapy   Patient is on Treatment Plan : OVARIAN Carboplatin (AUC 6) / Paclitaxel (175) q21d x 6 cycles     01/07/2023 -  Chemotherapy   Patient is on Treatment Plan : OVARIAN RECURRENT 3RD LINE Carboplatin D1 + Gemcitabine D1,8 (4/800) q21d        INTERVAL HISTORY:   Michelle Holt is a 53 y.o. female presenting to clinic today for follow up of high-grade serous ovarian cancer. She was last seen by me on 02/18/23.  Since her last visit, she was admitted to the  hospital from 03/07/23 to 03/10/23 for a GI bleed. Taneisha had 1 unit of PRBC's transfused on 2/22. She was also found to have a left leg DVT and had an IVC filter placed by IR at [facility] on 2/22 after transfer. She underwent an EGD with colonoscopy on 2/23 which found diverticulosis, internal hemorrhoids, nonbleeding esophageal ulcer, and nonbleeding duodenal ulcer. Paisely was put on PPI.   Her appetite level is at ***%. Her energy level is at ***%. She is accompanied by her a family member.   PAST MEDICAL HISTORY:   Past Medical History: Past Medical History:  Diagnosis Date   Anemia    Anxiety and depression    Arthritis of facet joints at multiple vertebral levels    L5-S1   Constipation    Dyslipidemia    Family history of breast cancer    Family history of uterine cancer    GERD (gastroesophageal reflux disease)    History of hiatal hernia    History of kidney stones    Insomnia    Irritable bowel syndrome    Migraine    Muscle tension headache    Neuropathy of finger    Ovarian carcinoma (HCC)    ovarian   Plantar fasciitis of right  foot    Port-A-Cath in place 08/20/2018    Surgical History: Past Surgical History:  Procedure Laterality Date   ABDOMINAL HYSTERECTOMY     BIOPSY  10/27/2020   Procedure: BIOPSY;  Surgeon: Dolores Frame, MD;  [facility]: [facility];  Service: Gastroenterology;;   BIOPSY  03/09/2023   Procedure: BIOPSY;  Surgeon: Imogene Burn, MD;  Location: [facility] ENDOSCOPY;  Service: Gastroenterology;;   CHOLECYSTECTOMY  2008   COLONOSCOPY N/A 08/13/2013   Procedure: COLONOSCOPY;  Surgeon: Malissa Hippo, MD;  Location: [facility];  Service: Endoscopy;  Laterality: N/A;  230-moved to 145 Ann to notify pt   COLONOSCOPY WITH PROPOFOL N/A 11/15/2020   Procedure: COLONOSCOPY WITH PROPOFOL;  Surgeon: Malissa Hippo, MD;  [facility]: [facility];  Service: Endoscopy;  Laterality: N/A;  1:40   COLONOSCOPY WITH PROPOFOL N/A 03/09/2023   Procedure:  COLONOSCOPY WITH PROPOFOL;  Surgeon: Imogene Burn, MD;  Location: [facility] ENDOSCOPY;  Service: Gastroenterology;  Laterality: N/A;   ESOPHAGEAL DILATION N/A 02/28/2021   Procedure: ESOPHAGEAL DILATION;  Surgeon: Malissa Hippo, MD;  [facility]: [facility];  Service: Endoscopy;  Laterality: N/A;   ESOPHAGOGASTRODUODENOSCOPY     ESOPHAGOGASTRODUODENOSCOPY (EGD) WITH PROPOFOL N/A 07/20/2018   Procedure: ESOPHAGOGASTRODUODENOSCOPY (EGD) WITH PROPOFOL;  Surgeon: Malissa Hippo, MD;  [facility];  Service: Endoscopy;  Laterality: N/A;  Possible esophageal dilation.   ESOPHAGOGASTRODUODENOSCOPY (EGD) WITH PROPOFOL N/A 10/27/2020   Procedure: ESOPHAGOGASTRODUODENOSCOPY (EGD) WITH PROPOFOL;  Surgeon: Dolores Frame, MD;  [facility]: [facility];  Service: Gastroenterology;  Laterality: N/A;  2:10, pt knows to arrive at 10:15   ESOPHAGOGASTRODUODENOSCOPY (EGD) WITH PROPOFOL N/A 02/28/2021   Procedure: ESOPHAGOGASTRODUODENOSCOPY (EGD) WITH PROPOFOL;  Surgeon: Malissa Hippo, MD;  [facility];  Service: Endoscopy;  Laterality: N/A;  200 ASA 1   ESOPHAGOGASTRODUODENOSCOPY (EGD) WITH PROPOFOL N/A 03/29/2022   Procedure: ESOPHAGOGASTRODUODENOSCOPY (EGD) WITH PROPOFOL;  Surgeon: Dolores Frame, MD;  [facility];  Service: Gastroenterology;  Laterality: N/A;  2:00 pm, asa 1-2   ESOPHAGOGASTRODUODENOSCOPY (EGD) WITH PROPOFOL N/A 03/09/2023   Procedure: ESOPHAGOGASTRODUODENOSCOPY (EGD) WITH PROPOFOL;  Surgeon: Imogene Burn, MD;  Location: [facility] ENDOSCOPY;  Service: Gastroenterology;  Laterality: N/A;   GIVENS CAPSULE STUDY N/A 11/24/2020   Procedure: GIVENS CAPSULE STUDY;  Surgeon: Malissa Hippo, MD;  [facility];  Service: Endoscopy;  Laterality: N/A;  7:30   IR ANGIOGRAM SELECTIVE EACH ADDITIONAL VESSEL  08/01/2018   IR ANGIOGRAM VISCERAL SELECTIVE  08/01/2018   IR EMBO ART  VEN HEMORR LYMPH EXTRAV  INC GUIDE ROADMAPPING  08/01/2018   IR IMAGING  GUIDED PORT INSERTION  08/20/2018   IR IVC FILTER PLMT / S&I /IMG GUID/MOD SED  03/08/2023   IR PERC PLEURAL DRAIN W/INDWELL CATH W/IMG GUIDE  07/08/2018   IR THORACENTESIS ASP PLEURAL SPACE W/IMG GUIDE  07/07/2018   IR US GUIDE VASC ACCESS RIGHT  08/01/2018   PLEURAL EFFUSION DRAINAGE Left 07/13/2018   Procedure: DRAINAGE OF LOCULATED PLEURAL EFFUSION;  Surgeon: Kerin Perna, MD;  Location: [facility] OR;  Service: Thoracic;  Laterality: Left;   POLYPECTOMY  11/15/2020   Procedure: POLYPECTOMY;  Surgeon: Malissa Hippo, MD;  [facility];  Service: Endoscopy;;   REMOVAL OF PLEURAL DRAINAGE CATHETER Left 08/20/2018   Procedure: REMOVAL OF PLEURAL DRAINAGE CATHETER;  Surgeon: Kerin Perna, MD;  Location: [facility] OR;  Service: Thoracic;  Laterality: Left;   REMOVAL OF PLEURAL DRAINAGE  CATHETER Left 08/20/2018   Procedure: REMOVAL OF PLEURAL DRAINAGE CATHETER;  Surgeon: Kerin Perna, MD;  Location: [facility] OR;  Service: Thoracic;  Laterality: Left;   TALC PLEURODESIS Left 07/13/2018   Procedure: Talc Pleuradesis;  Surgeon: Donata Clay, Theron Arista, MD;  Location: [facility] OR;  Service: Thoracic;  Laterality: Left;   TOTAL HIP ARTHROPLASTY Left 06/05/2020   Procedure: LEFT TOTAL HIP ARTHROPLASTY ANTERIOR APPROACH;  Surgeon: Tarry Kos, MD;  Location: [facility] OR;  Service: Orthopedics;  Laterality: Left;  3-C   TUBAL LIGATION Bilateral    UTERINE ABLATION     VIDEO ASSISTED THORACOSCOPY Left 07/13/2018   Procedure: VIDEO ASSISTED THORACOSCOPY;  Surgeon: Kerin Perna, MD;  Location[facility] OR;  Service: Thoracic;  Laterality: Left;    Social History: Social History   Socioeconomic History   Marital status: Widowed    Spouse name: Not on file   Number of children: 2   Years of education: 2-College   Highest education level: Not on file  Occupational History    Employer: [employer]  Tobacco Use   Smoking status: Every Day    Current packs/day: 0.50    Average packs/day: 0.5 packs/day for 17.3 years (8.6 ttl pk-yrs)     Types: Cigarettes    Start date: 06/21/2001    Last attempt to quit: 06/22/2018   Smokeless tobacco: Never  Vaping Use   Vaping status: Never Used  Substance and Sexual Activity   Alcohol use: Not Currently    Alcohol/week: 1.0 standard drink of alcohol    Types: 1 Glasses of wine per week    Comment: occasionally   Drug use: No   Sexual activity: Not Currently  Other Topics Concern   Not on file  Social History Narrative   Patient lives at home with her daughter.    Patient has 2 children.    Patient is widowed.    Patient is right handed.    Patient has her Associates degree.      Social Drivers of Corporate investment banker Strain: Low Risk  (08/26/2022)   Received from [facility]   Overall Financial Resource Strain (CARDIA)    Difficulty of Paying Living Expenses: Not hard at all  Food Insecurity: No Food Insecurity (02/10/2023)   Hunger Vital Sign    Worried About Running Out of Food in the Last Year: Never true    Ran Out of Food in the Last Year: Never true  Transportation Needs: No Transportation Needs (02/10/2023)   PRAPARE - Administrator, Civil Service (Medical): No    Lack of Transportation (Non-Medical): No  Physical Activity: Inactive (07/22/2018)   Exercise Vital Sign    Days of Exercise per Week: 0 days    Minutes of Exercise per Session: 0 min  Stress: Stress Concern Present (07/22/2018)   [employer] of Occupational Health - Occupational Stress Questionnaire    Feeling of Stress : Very much  Social Connections: Moderately Isolated (07/22/2018)   Social Connection and Isolation Panel [NHANES]    Frequency of Communication with Friends and Family: More than three times a week    Frequency of Social Gatherings with Friends and Family: More than three times a week    Attends Religious Services: 1 to 4 times per year    Active Member of [employer] or Organizations: No    Attends Banker Meetings: Never    Marital  Status: Widowed  Intimate Partner Violence: Not At Risk (  02/10/2023)   Humiliation, Afraid, Rape, and Kick questionnaire    Fear of Current or Ex-Partner: No    Emotionally Abused: No    Physically Abused: No    Sexually Abused: No    Family History: Family History  Problem Relation Age of Onset   Depression Mother    Hypertension Mother    Obesity Mother    Diabetes Mother    Kidney disease Mother    Peripheral vascular disease Father    Atrial fibrillation Father    Crohn's disease Sister    Uterine cancer Sister 67       maternal half sister   COPD Brother    Osteoporosis Brother    Breast cancer Maternal Aunt 50   Colon cancer Neg Hx     Current Medications:  Current Outpatient Medications:    albuterol (VENTOLIN HFA) 108 (90 Base) MCG/ACT inhaler, Inhale 2 puffs into the lungs every 6 (six) hours as needed for wheezing or shortness of breath., Disp: , Rfl:    diazepam (VALIUM) 5 MG tablet, Take 1 tablet (5 mg total) by mouth at bedtime., Disp: 30 tablet, Rfl: 5   dicyclomine (BENTYL) 20 MG tablet, Take 1 tablet (20 mg total) by mouth every 6 (six) hours as needed for spasms., Disp: 120 tablet, Rfl: 1   HYDROmorphone (DILAUDID) 8 MG tablet, Take 1 tablet (8 mg total) by mouth every 4 (four) hours as needed (Breakthrough Pain)., Disp: 180 tablet, Rfl: 0   LORazepam (ATIVAN) 0.5 MG tablet, Take 0.5 mg by mouth 3 (three) times daily as needed for anxiety., Disp: , Rfl:    ondansetron (ZOFRAN-ODT) 4 MG disintegrating tablet, Take 1 tablet (4 mg total) by mouth every 8 (eight) hours as needed for nausea or vomiting., Disp: 90 tablet, Rfl: 2   pantoprazole (PROTONIX) 40 MG tablet, Take 1 tablet (40 mg total) by mouth 2 (two) times daily before a meal., Disp: 60 tablet, Rfl: 2 No current facility-administered medications for this visit.  Facility-Administered Medications Ordered in Other Visits:    sodium chloride flush (NS) 0.9 % injection 10 mL, 10 mL, Intravenous, PRN,  Doreatha Massed, MD, 10 mL at 10/23/20 1544   Allergies: Allergies  Allergen Reactions   Carboplatin Shortness Of Breath    SOB and flushing of the skin.   Morphine And Codeine Itching   Nickel Itching   Nortriptyline Other (See Comments)    Significant weight gain   Topamax [Topiramate] Diarrhea and Nausea Only   Xanax [Alprazolam] Other (See Comments)    "Can't wake up"   Actifed Cold-Allergy [Chlorpheniramine-Phenyleph Er] Rash and Other (See Comments)    Red dye only   Amoxicillin Rash   Codeine Hives   Erythromycin Rash   Gemzar [Gemcitabine] Other (See Comments)    Feeling flushed, red in the face   Penicillins Rash        Red Dye Rash   Sudafed [Pseudoephedrine Hcl] Rash and Other (See Comments)    Red dye only    REVIEW OF SYSTEMS:   Review of Systems  Constitutional:  Negative for chills, fatigue and fever.  HENT:   Negative for lump/mass, mouth sores, nosebleeds, sore throat and trouble swallowing.   Eyes:  Negative for eye problems.  Respiratory:  Negative for cough and shortness of breath.   Cardiovascular:  Negative for chest pain, leg swelling and palpitations.  Gastrointestinal:  Negative for abdominal pain, constipation, diarrhea, nausea and vomiting.  Genitourinary:  Negative for bladder incontinence, difficulty urinating,  dysuria, frequency, hematuria and nocturia.   Musculoskeletal:  Negative for arthralgias, back pain, flank pain, myalgias and neck pain.  Skin:  Negative for itching and rash.  Neurological:  Negative for dizziness, headaches and numbness.  Hematological:  Does not bruise/bleed easily.  Psychiatric/Behavioral:  Negative for depression, sleep disturbance and suicidal ideas. The patient is not nervous/anxious.   All other systems reviewed and are negative.    VITALS:   There were no vitals taken for this visit.  Wt Readings from Last 3 Encounters:  02/27/23 158 lb (71.7 kg)  02/18/23 172 lb 1.6 oz (78.1 kg)  02/10/23 168 lb  3.4 oz (76.3 kg)    There is no height or weight on file to calculate BMI.  Performance status (ECOG): 1 - Symptomatic but completely ambulatory  PHYSICAL EXAM:   Physical Exam Vitals and nursing note reviewed. Exam conducted with a chaperone present.  Constitutional:      Appearance: Normal appearance.  Cardiovascular:     Rate and Rhythm: Normal rate and regular rhythm.     Pulses: Normal pulses.     Heart sounds: Normal heart sounds.  Pulmonary:     Effort: Pulmonary effort is normal.     Breath sounds: Normal breath sounds.  Abdominal:     Palpations: Abdomen is soft. There is no hepatomegaly, splenomegaly or mass.     Tenderness: There is no abdominal tenderness.  Musculoskeletal:     Right lower leg: No edema.     Left lower leg: No edema.  Lymphadenopathy:     Cervical: No cervical adenopathy.     Right cervical: No superficial, deep or posterior cervical adenopathy.    Left cervical: No superficial, deep or posterior cervical adenopathy.     Upper Body:     Right upper body: No supraclavicular or axillary adenopathy.     Left upper body: No supraclavicular or axillary adenopathy.  Neurological:     General: No focal deficit present.     Mental Status: She is alert and oriented to person, place, and time.  Psychiatric:        Mood and Affect: Mood normal.        Behavior: Behavior normal.     LABS:      Latest Ref Rng & Units 03/10/2023    5:25 AM 03/09/2023    4:10 AM 03/09/2023    3:00 AM  CBC  WBC 4.0 - 10.5 K/uL 5.4   9.8   Hemoglobin 12.0 - 15.0 g/dL 78.2  9.8  9.3   Hematocrit 36.0 - 46.0 % 30.9  29.7  29.4   Platelets 150 - 400 K/uL 120   176       Latest Ref Rng & Units 03/10/2023    3:46 AM 03/09/2023    3:00 AM 03/08/2023    9:03 AM  CMP  Glucose 70 - 99 mg/dL 88  84  78   BUN 6 - 20 mg/dL 5  14  9    Creatinine 0.44 - 1.00 mg/dL 9.56  2.13  0.86   Sodium 135 - 145 mmol/L 137  142  137   Potassium 3.5 - 5.1 mmol/L 3.6  4.2  3.8   Chloride 98  - 111 mmol/L 105  109  99   CO2 22 - 32 mmol/L 26  27  27    Calcium 8.9 - 10.3 mg/dL 7.6  8.4  8.1   Total Protein 6.5 - 8.1 g/dL  4.5    Total Bilirubin  0.0 - 1.2 mg/dL  0.9    Alkaline Phos 38 - 126 U/L  147    AST 15 - 41 U/L  52    ALT 0 - 44 U/L  43       No results found for: "CEA1", "CEA" / No results found for: "CEA1", "CEA" No results found for: "PSA1" No results found for: "MVH846" Lab Results  Component Value Date   CAN125 476.0 (H) 02/18/2023    No results found for: "TOTALPROTELP", "ALBUMINELP", "A1GS", "A2GS", "BETS", "BETA2SER", "GAMS", "MSPIKE", "SPEI" Lab Results  Component Value Date   TIBC 288 11/12/2022   TIBC 329 08/05/2022   TIBC 346 05/27/2022   FERRITIN 206 11/12/2022   FERRITIN 219 08/05/2022   FERRITIN 67 05/27/2022   IRONPCTSAT 17 11/12/2022   IRONPCTSAT 29 08/05/2022   IRONPCTSAT 14 05/27/2022   Lab Results  Component Value Date   LDH 277 (H) 07/18/2018   LDH 193 (H) 07/07/2018     STUDIES:   IR IVC FILTER PLMT / S&I Lenise Arena GUID/MOD SED Result Date: 03/08/2023 CLINICAL DATA:  Left lower extremity DVT. GI bleed, a relative contraindication to anticoagulation. Caval filtration requested. EXAM: INFERIOR VENACAVOGRAM IVC FILTER PLACEMENT UNDER FLUOROSCOPY FLUOROSCOPY: Radiation Exposure Index (as provided by the fluoroscopic device): 22 mGy air Kerma TECHNIQUE: Patency of the right IJ vein was confirmed with ultrasound with image documentation. An appropriate skin site was determined. Skin site was marked, prepped with chlorhexidine, and draped using maximum barrier technique. The region was infiltrated locally with 1% lidocaine. Intravenous Fentanyl and Versed 1mg  were administered by RN during a total moderate (conscious) sedation time of 11 minutes; the patient's level of consciousness and physiological / cardiorespiratory status were monitored continuously by radiology RN under my direct supervision. Under real-time ultrasound guidance, the  right IJ vein was accessed with a 21 gauge micropuncture needle; the needle tip within the vein was confirmed with ultrasound image documentation. The needle was exchanged over a 018 guidewire for a transitional dilator, which allow advancement of the [facility] wire into the IVC. A long 6 French vascular sheath was placed for inferior venacavography. This demonstrated no caval thrombus. Renal vein inflows were evident. The Cook Celect IVC filter was advanced through the sheath and successfully deployed under fluoroscopy at the L2-3 level. Followup cavagram demonstrates stable filter position and no evident complication. The sheath was removed and hemostasis achieved at the site. No immediate complication. IMPRESSION: 1. Normal IVC. No thrombus or significant anatomic variation. 2. Technically successful infrarenal IVC filter placement. This is a retrievable model. PLAN: This IVC filter is potentially retrievable. The patient will be assessed for filter retrieval by Interventional Radiology in approximately 8-12 weeks. Further recommendations regarding filter retrieval, continued surveillance or declaration of device permanence, will be made at that time. Electronically Signed   By: Corlis Leak M.D.   On: 03/08/2023 21:47   CT ANGIO GI BLEED Result Date: 03/08/2023 CLINICAL DATA:  GI bleed. Concern for gas distal bleeding. Same day IVC filter placed. Ovarian carcinoma. * Tracking Code: BO * EXAM: CTA ABDOMEN AND PELVIS WITHOUT AND WITH CONTRAST TECHNIQUE: Multidetector CT imaging of the abdomen and pelvis was performed using the standard protocol during bolus administration of intravenous contrast. Multiplanar reconstructed images and MIPs were obtained and reviewed to evaluate the vascular anatomy. RADIATION DOSE REDUCTION: This exam was performed according to the departmental dose-optimization program which includes automated exposure control, adjustment of the mA and/or kV according to patient size and/or use of  iterative reconstruction technique. CONTRAST:  75mL OMNIPAQUE IOHEXOL 350 MG/ML SOLN, 75mL OMNIPAQUE IOHEXOL 350 MG/ML SOLN COMPARISON:  CT 02/12/2023 FINDINGS: VASCULAR Aorta: Normal caliber aorta without aneurysm, dissection, vasculitis or significant stenosis. Celiac: Patent without evidence of aneurysm, dissection, vasculitis or significant stenosis. SMA: Patent without evidence of aneurysm, dissection, vasculitis or significant stenosis. Renals: Both renal arteries are patent without evidence of aneurysm, dissection, vasculitis, fibromuscular dysplasia or significant stenosis. IMA: Patent without evidence of aneurysm, dissection, vasculitis or significant stenosis. Inflow: Patent without evidence of aneurysm, dissection, vasculitis or significant stenosis. Veins: Infrarenal IVC filter noted. Review of the MIP images confirms the above findings. NON-VASCULAR Lower chest: Lung bases are clear. Hepatobiliary: Hepatic steatosis.  Postcholecystectomy. Pancreas: Pancreas is normal. No ductal dilatation. No pancreatic inflammation. 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 No interval change. Adrenals/urinary tract: Adrenal glands and kidneys are normal. The ureters and bladder normal. Stomach/Bowel: Small hiatal hernia. No intraluminal IV contrast to localize active gastrointestinal bleeding within the small bowel or colon. There are fluid collections within leaves of the mesentery. Several these collections have a thin thickened rim. For example in the LEFT lower quadrant along the pericolic gutter thickened peritoneal measures 3 mm image 46/4. This is not changed from comparison CT. A similar collection in the anterior pelvis on image 64/16. Vascular/Lymphatic: Abdominal aorta is normal caliber. No periportal or retroperitoneal adenopathy. No pelvic adenopathy. Reproductive: Post hysterectomy.  Adnexa unremarkable Other: Peritoneal fluid collections as above. Musculoskeletal: No  aggressive osseous lesion. IMPRESSION: VASCULAR 1. No significant arterial vascular abnormality. 2. Infrarenal IVC filter noted. NON-VASCULAR 1. No evidence of active gastrointestinal bleeding within the small bowel or colon. 2. Again demonstrated peritoneal fluid collections in the abdomen pelvis with thickened peritoneal rim. Findings suggest peritoneal carcinomatosis versus peritonitis. Favor peritoneal metastasis in patient with ovarian carcinoma. No interval change from CT 02/12/2023. 3. Hepatic steatosis Electronically Signed   By: Genevive Bi M.D.   On: 03/08/2023 15:23   US Venous Img Lower Unilateral Left Result Date: 03/07/2023 CLINICAL DATA:  Left calf swelling EXAM: LEFT LOWER EXTREMITY VENOUS DOPPLER ULTRASOUND TECHNIQUE: Gray-scale sonography with graded compression, as well as color Doppler and duplex ultrasound were performed to evaluate the lower extremity deep venous systems from the level of the common femoral vein and including the common femoral, femoral, profunda femoral, popliteal and calf veins including the posterior tibial, peroneal and gastrocnemius veins when visible. The superficial great saphenous vein was also interrogated. Spectral Doppler was utilized to evaluate flow at rest and with distal augmentation maneuvers in the common femoral, femoral and popliteal veins. COMPARISON:  None Available. FINDINGS: Contralateral Common Femoral Vein: Respiratory phasicity is normal and symmetric with the symptomatic side. No evidence of thrombus. Normal compressibility. Common Femoral Vein: No evidence of thrombus. Normal compressibility, respiratory phasicity and response to augmentation. Saphenofemoral Junction: No evidence of thrombus. Normal compressibility and flow on color Doppler imaging. Profunda Femoral Vein: No evidence of thrombus. Normal compressibility and flow on color Doppler imaging. Femoral Vein: Femoral vein is patent and unremarkable in the upper thigh. However, the  vessel becomes noncompressible in the mid thigh. No evidence of color flow on color Doppler imaging. Findings are consistent with occlusive thrombus. Occlusive thrombus extends through the distal. Popliteal Vein: Occlusive thrombus extends into the popliteal in pain. Calf Veins: Occlusive thrombus extends into the calf veins. Superficial Great Saphenous Vein: No evidence of thrombus. Normal compressibility. Venous Reflux:  None. Other Findings:  None. IMPRESSION: Positive for acute occlusive DVT  in the left lower extremity from the femoral vein in the mid thigh through the popliteal vein and into the proximal calf veins. Electronically Signed   By: Malachy Moan M.D.   On: 03/07/2023 13:30

## 2023-03-20 ENCOUNTER — Inpatient Hospital Stay

## 2023-03-20 ENCOUNTER — Inpatient Hospital Stay: Attending: Hematology

## 2023-03-20 ENCOUNTER — Inpatient Hospital Stay: Admitting: Hematology

## 2023-03-21 ENCOUNTER — Other Ambulatory Visit: Payer: Self-pay

## 2023-03-25 ENCOUNTER — Inpatient Hospital Stay: Payer: 59

## 2023-03-25 NOTE — Progress Notes (Signed)
 [facility] [address] [place] [address]    Clinic Day:  03/26/2023  Referring physician: Ignatius Specking, MD  Patient Care Team: Ignatius Specking, MD as PCP - General (Internal Medicine) Doreatha Massed, MD as Medical Oncologist (Oncology)   ASSESSMENT & PLAN:   Assessment: 1.  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 -Robotic assisted laparoscopic TAH and BSO and omentectomy on 12/24/2018, pathology showing high-grade serous carcinoma, PT3P NX. -Germline mutation testing was negative. -3 more cycles of adjuvant chemotherapy completed on 03/15/2019. -CTAP on 04/13/2019 showed no findings of active malignancy.  28% reduction in the volume of presumed chronic hematoma/chronic fluid collection splaying the upper margin of the spleen.  Large type III hiatal hernia. -CTAP on 10/13/2019 shows no findings of recurrence or metastatic disease. - Foundation 1 shows HRD+, LOH score>16%.  MSI-stable.  MYC amplification.  T p53 mutation. - We reviewed CT CAP from 02/21/2021 which showed progressive peritoneal metastasis compared to 12/06/2020 scan.  No bowel obstruction.  Small right pleural effusion with mild enlargement of an isolated left external iliac lymph node. - Reviewed EGD from 02/28/2021 which showed hiatal hernia and normal findings. - She reported epigastric and left upper quadrant pain worse in the last 1 week which is related to progression of her malignancy. - 6 cycles of carboplatin and paclitaxel from 03/13/2021 through 06/18/2021 - CT CAP (07/12/2021): No evidence of recurrence or metastatic disease. - Olaparib 300 mg twice daily from 08/22/2021 through 11/28/2022 with progression - Cycle 1 carboplatin, gemcitabine and bevacizumab on 01/07/2023    Plan: 1.  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 [facility] admission from 03/07/2023 through 03/10/2023 with BRBPR and left leg pain.  She was diagnosed with left leg DVT.  She underwent IVC filter placement on 03/08/2023.  She had EGD and colonoscopy on 03/09/2023.  EGD found esophageal ulcer with no bleeding, gastritis, nonbleeding duodenal ulcer.  Colonoscopy found diverticulosis and internal hemorrhoids.  She received 1 unit PRBC.  She was started on PPI twice daily for 12 weeks. - Reviewed labs today: Mildly elevated LFTs and low albumin.  CBC grossly normal. - She reports feeling very weak and has occasional epigastric pain. - I reviewed her recent CT scan which did not show any progression.  However she had reaction to carboplatin.  Hence I have recommended switching of therapy.  We also discussed best supportive care in the form of hospice.  She wants to give 1 last trial with chemotherapy.  I will switch her to Doxil 40 mg/m every 28 days with Avastin every 2 weeks.  We discussed side effects in detail.  Previous echo in 2017 was normal. - Today she is hypotensive.  Will give her 1 L of fluids without lites.  She will proceed with cycle 1 of Doxil today.  RTC 2 weeks for follow-up.   2.  Lower back/left upper quadrant pain: - She is taking Dilaudid 8 mg every 8 hours.  However she feels very nauseous. - I will switch her to oxycodone 20 mg every 4-6 hours as needed.  She will stop taking Dilaudid.   3.  Left leg DVT (diagnosis 03/06/2022): - Left leg ultrasound showed acute DVT in the left leg from femoral vein in the  mid thigh through popliteal vein into the proximal calf veins. - She had IVC filter placement on 03/08/2023.  We are holding off on anticoagulation at this time.  4.  Iron deficiency state: - Hemoglobin improved to 13.2 today.  No indication for parenteral iron therapy.  5.  Constipation: - Continue stool  softeners.  6.  Hypomagnesemia: - She is taking magnesium once daily.  She will increase magnesium to twice daily as her magnesium is low.      Orders Placed This Encounter  Procedures   Consent Attestation for Oncology Treatment    The patient is informed of risks, benefits, side-effects of the prescribed oncology treatment. Potential short term and long term side effects and response rates discussed. After a long discussion, the patient made informed decision to proceed.:   Yes   Urinalysis, dipstick only    Standing Status:   Future    Number of Occurrences:   1    Expected Date:   03/27/2023    Expiration Date:   03/26/2024   CBC with Differential    Standing Status:   Future    Expected Date:   04/10/2023    Expiration Date:   04/09/2024   Comprehensive metabolic panel    Standing Status:   Future    Expected Date:   04/10/2023    Expiration Date:   04/09/2024   Magnesium    Standing Status:   Future    Expected Date:   04/10/2023    Expiration Date:   04/09/2024   Magnesium    Standing Status:   Future    Expected Date:   04/24/2023    Expiration Date:   04/23/2024   CBC with Differential    Standing Status:   Future    Expected Date:   04/24/2023    Expiration Date:   04/23/2024   Comprehensive metabolic panel    Standing Status:   Future    Expected Date:   04/24/2023    Expiration Date:   04/23/2024   CBC with Differential    Standing Status:   Future    Expected Date:   05/08/2023    Expiration Date:   05/07/2024   Comprehensive metabolic panel    Standing Status:   Future    Expected Date:   05/08/2023    Expiration Date:   05/07/2024   Magnesium    Standing Status:   Future    Expected Date:   05/08/2023    Expiration Date:   05/07/2024   Magnesium    Standing Status:   Future    Expected Date:   05/22/2023    Expiration Date:   05/21/2024   CBC with Differential    Standing Status:   Future    Expected Date:   05/22/2023    Expiration Date:   05/21/2024   Comprehensive  metabolic panel    Standing Status:   Future    Expected Date:   05/22/2023    Expiration Date:   05/21/2024   Urinalysis, dipstick only    Standing Status:   Future    Expected Date:   05/22/2023    Expiration Date:   05/21/2024   CBC with Differential    Standing Status:   Future    Expected Date:   06/05/2023    Expiration Date:   06/04/2024   Comprehensive metabolic panel    Standing Status:   Future    Expected Date:   06/05/2023  Expiration Date:   06/04/2024   Magnesium    Standing Status:   Future    Expected Date:   06/05/2023    Expiration Date:   06/04/2024   Magnesium    Standing Status:   Future    Expected Date:   06/19/2023    Expiration Date:   06/18/2024   CBC with Differential    Standing Status:   Future    Expected Date:   06/19/2023    Expiration Date:   06/18/2024   Comprehensive metabolic panel    Standing Status:   Future    Expected Date:   06/19/2023    Expiration Date:   06/18/2024   CBC with Differential    Standing Status:   Future    Expected Date:   07/03/2023    Expiration Date:   07/02/2024   Comprehensive metabolic panel    Standing Status:   Future    Expected Date:   07/03/2023    Expiration Date:   07/02/2024   Magnesium    Standing Status:   Future    Expected Date:   07/03/2023    Expiration Date:   07/02/2024   [facility] PHYSICIAN COMMUNICATION 1    UA Protein, dipstick required prior to every 3rd cycle bevacizumab   PHYSICIAN COMMUNICATION ORDER    A baseline Echo/ Muga should be obtained prior to initiation of Anthracycline Chemotherapy      I,[name],acting as a scribe for Doreatha Massed, MD.,have documented all relevant documentation on the behalf of Doreatha Massed, MD,as directed by  Doreatha Massed, MD while in the presence of Doreatha Massed, MD.   I, Doreatha Massed MD, have reviewed the above documentation for accuracy and completeness, and I agree with the above.   Doreatha Massed, MD   3/12/20255:39  PM  CHIEF COMPLAINT:   Diagnosis: high-grade serous ovarian cancer    Cancer Staging  No matching staging information was found for the patient.    Prior Therapy: 1. Carboplatin and paclitaxel x 7 cycles from 08/24/2018 to 03/15/2019. 2. Laparoscopic TAH & BSO & omenectomy on 12/24/2018. 3.  6 cycles of carboplatin and paclitaxel from 03/05/2021 to 06/18/2021 4. Olaparib, 08/22/21 - 11/28/22  Current Therapy:  carboplatin, gemcitabine and bevacizumab    HISTORY OF PRESENT ILLNESS:   Oncology History  Ovarian cancer, bilateral (HCC)  07/07/2018 Pathology Results   PLEURAL FLUID, LEFT (SPECIMEN 1 OF 1, COLLECTED 07/07/18): - MALIGNANT CELLS CONSISTENT WITH ADENOCARCINOMA - SEE COMMENT  Source Pleural Fluid, (Specimen 1 of 1, collected on 07/07/2018) Gross Specimen: Received is/are 1000cc of bloody red fluid with tissue. (TC:tc) Prepared: # Smears: 0 # Concentration Technique Slides (i.e. ThinPrep): 1 # Cell Block: 1 Conventional Additional Studies: Two Hematology slides labeled [id number] Comment The malignant cells are positive for cytokeratin 7, p53, WT-1, Pax-8, Moc31, ER (weak) and EMA but negative for cytokeratin 20, TTF-1, GATA-3, CDX-2 and D2-40. Overall, the phenotype is consistent with a gynecologic primary; clinical correlation recommended.   07/07/2018 Procedure   Successful ultrasound guided left thoracentesis yielding 2.0 L of pleural fluid   07/08/2018 Procedure   1. Technically successful placement of left 14 French pigtail chest drain, placed to Pleur-evac water-seal.   07/08/2018 Procedure   1. Technically successful five [place] double lumen power injectable PICC placement   07/09/2018 Imaging   Ct chest 1. There is a moderate, loculated left hydropneumothorax with a small air component and moderate fluid component. The largest loculated component is located posteriorly. There  is a pigtail drainage catheter about the lateral pleural space. There is no obvious  etiology, such as obvious mass or pleural disease.   2. There is a small right pleural effusion with associated atelectasis or consolidation and a subpleural consolidation of the superior segment right lower lobe (series 4, image 56), of uncertain significance, possibly infectious or inflammatory   07/10/2018 Imaging   Ct abdomen and pelvis: 1. The bilateral ovaries are enlarged by heterogeneous appearing cystic lesions, measuring 5.3 x 4.2 cm on the right (series 4, image 72) and 4.5 x 3.2 cm on the left (series 4, image 75). Consider dedicated pelvic ultrasound and/or pelvic MRI to further evaluate for solid components given high suspicion for GYN primary malignancy.   2. No other evidence of mass and no lymphadenopathy in the abdomen or pelvis.   3. Trace ascites. There is some suggestion of omental and peritoneal nodularity (e.g. Series 4, image 43), concerning for peritoneal metastatic disease.    4. Loculated left-sided pleural effusion with left-sided pleural drainage catheter in position. Small right pleural effusion   07/13/2018 Surgery   OPERATION: 1.  Left VATS (video-assisted thoracoscopic surgery) for drainage of loculated pleural effusion. 2.  Talc pleurodesis for malignant pleural effusion. 3.  Placement of PleurX catheter for management of malignant pleural effusion. 4.  Placement of On-Q analgesia catheter system.    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   07/13/2018 Pathology Results   Pleura, peel, Left Pleural - FIBRO-FIBRINOUS PLEURITIS - NEGATIVE FOR MALIGNANCY   07/18/2018 Initial Diagnosis   Ovarian cancer, bilateral (HCC)   07/20/2018 Procedure   EGD impression: Normal proximal esophagus and mid esophagus. Mild distal esophageal rings; dilation not performed because of  esophagitis. LA Grade C reflux esophagitis. Z-line regular, 30 cm from the incisors. 5 cm hiatal hernia. Non-bleeding gastric ulcer with no stigmata of bleeding. Gastritis. Duodenal erosions without bleeding. Normal second portion of the duodenum. No specimens collected.   08/19/2018 Genetic Testing   Negative genetic testing on the common hereditary cancer panel.  The Common Hereditary Gene Panel offered by [facility] includes sequencing and/or deletion duplication testing of the following 48 genes: APC, ATM, AXIN2, BARD1, BMPR1A, BRCA1, BRCA2, BRIP1, CDH1, CDK4, CDKN2A (p14ARF), CDKN2A (p16INK4a), CHEK2, CTNNA1, DICER1, EPCAM (Deletion/duplication testing only), GREM1 (promoter region deletion/duplication testing only), KIT, MEN1, MLH1, MSH2, MSH3, MSH6, MUTYH, NBN, NF1, NHTL1, PALB2, PDGFRA, PMS2, POLD1, POLE, PTEN, RAD50, RAD51C, RAD51D, RNF43, SDHB, SDHC, SDHD, SMAD4, SMARCA4. STK11, TP53, TSC1, TSC2, and VHL.  The following genes were evaluated for sequence changes only: SDHA and HOXB13 c.251G>A variant only. The report date is August 19, 2018.   08/24/2018 - 03/15/2019 Chemotherapy   Patient is on Treatment Plan : OVARIAN Carboplatin (AUC 6) / Paclitaxel (175) q21d x 6 cycles     03/05/2021 - 06/18/2021 Chemotherapy   Patient is on Treatment Plan : OVARIAN Carboplatin (AUC 6) / Paclitaxel (175) q21d x 6 cycles     01/07/2023 - 02/27/2023 Chemotherapy   Patient is on Treatment Plan : OVARIAN RECURRENT 3RD LINE Carboplatin D1 + Gemcitabine D1,8 (4/800) q21d     03/26/2023 -  Chemotherapy   Patient is on Treatment Plan : OVARIAN Liposomal Doxorubicin + Bevacizumab q28d        INTERVAL HISTORY:   Michelle Holt is a 53 y.o. female presenting to clinic today for follow  up of high-grade serous ovarian cancer. She was last seen by me on 02/18/23.  Since her last visit, she presented to the ED on 03/07/23 with rectal bleeding/hematochezia and left leg pain. She was found to have DVT of LLE from femoral vein  through popliteal vein.  Today, she states that she is doing well overall. Her appetite level is at 10%. Her energy level is at 10%.  PAST MEDICAL HISTORY:   Past Medical History: Past Medical History:  Diagnosis Date   Anemia    Anxiety and depression    Arthritis of facet joints at multiple vertebral levels    L5-S1   Constipation    Dyslipidemia    Family history of breast cancer    Family history of uterine cancer    GERD (gastroesophageal reflux disease)    History of hiatal hernia    History of kidney stones    Insomnia    Irritable bowel syndrome    Migraine    Muscle tension headache    Neuropathy of finger    Ovarian carcinoma (HCC)    ovarian   Plantar fasciitis of right foot    Port-A-Cath in place 08/20/2018    Surgical History: Past Surgical History:  Procedure Laterality Date   ABDOMINAL HYSTERECTOMY     BIOPSY  10/27/2020   Procedure: BIOPSY;  Surgeon: Dolores Frame, MD;  [facility];  Service: Gastroenterology;;   BIOPSY  03/09/2023   Procedure: BIOPSY;  Surgeon: Imogene Burn, MD;  [facility];  Service: Gastroenterology;;   CHOLECYSTECTOMY  2008   COLONOSCOPY N/A 08/13/2013   Procedure: COLONOSCOPY;  Surgeon: Malissa Hippo, MD;  Location: [facility];  Service: Endoscopy;  Laterality: N/A;  230-moved to 145 Ann to notify pt   COLONOSCOPY WITH PROPOFOL N/A 11/15/2020   Procedure: COLONOSCOPY WITH PROPOFOL;  Surgeon: Malissa Hippo, MD;  Location: [facility];  Service: Endoscopy;  Laterality: N/A;  1:40   COLONOSCOPY WITH PROPOFOL N/A 03/09/2023   Procedure: COLONOSCOPY WITH PROPOFOL;  Surgeon: Imogene Burn, MD;  Location: [facility] ENDOSCOPY;  Service: Gastroenterology;  Laterality: N/A;   ESOPHAGEAL DILATION N/A 02/28/2021   Procedure: ESOPHAGEAL DILATION;  Surgeon: Malissa Hippo, MD;  Location: [facility];  Service: Endoscopy;  Laterality: N/A;   ESOPHAGOGASTRODUODENOSCOPY     ESOPHAGOGASTRODUODENOSCOPY (EGD)  WITH PROPOFOL N/A 07/20/2018   Procedure: ESOPHAGOGASTRODUODENOSCOPY (EGD) WITH PROPOFOL;  Surgeon: Malissa Hippo, MD;  [facility]: [facility];  Service: Endoscopy;  Laterality: N/A;  Possible esophageal dilation.   ESOPHAGOGASTRODUODENOSCOPY (EGD) WITH PROPOFOL N/A 10/27/2020   Procedure: ESOPHAGOGASTRODUODENOSCOPY (EGD) WITH PROPOFOL;  Surgeon: Dolores Frame, MD;  [facility]: [facility];  Service: Gastroenterology;  Laterality: N/A;  2:10, pt knows to arrive at 10:15   ESOPHAGOGASTRODUODENOSCOPY (EGD) WITH PROPOFOL N/A 02/28/2021   Procedure: ESOPHAGOGASTRODUODENOSCOPY (EGD) WITH PROPOFOL;  Surgeon: Malissa Hippo, MD;  [facility];  Service: Endoscopy;  Laterality: N/A;  200 ASA 1   ESOPHAGOGASTRODUODENOSCOPY (EGD) WITH PROPOFOL N/A 03/29/2022   Procedure: ESOPHAGOGASTRODUODENOSCOPY (EGD) WITH PROPOFOL;  Surgeon: Dolores Frame, MD;  [facility];  Service: Gastroenterology;  Laterality: N/A;  2:00 pm, asa 1-2   ESOPHAGOGASTRODUODENOSCOPY (EGD) WITH PROPOFOL N/A 03/09/2023   Procedure: ESOPHAGOGASTRODUODENOSCOPY (EGD) WITH PROPOFOL;  Surgeon: Imogene Burn, MD;  Location: [facility] ENDOSCOPY;  Service: Gastroenterology;  Laterality: N/A;   GIVENS CAPSULE STUDY N/A 11/24/2020   Procedure: GIVENS CAPSULE STUDY;  Surgeon: Malissa Hippo, MD;  Location: [facility];  Service: Endoscopy;  Laterality: N/A;  7:30   IR ANGIOGRAM SELECTIVE EACH ADDITIONAL VESSEL  08/01/2018   IR ANGIOGRAM VISCERAL SELECTIVE  08/01/2018   IR EMBO ART  VEN HEMORR LYMPH EXTRAV  INC GUIDE ROADMAPPING  08/01/2018   IR IMAGING GUIDED PORT INSERTION  08/20/2018   IR IVC FILTER PLMT / S&I /IMG GUID/MOD SED  03/08/2023   IR PERC PLEURAL DRAIN W/INDWELL CATH W/IMG GUIDE  07/08/2018   IR THORACENTESIS ASP PLEURAL SPACE W/IMG GUIDE  07/07/2018   IR US GUIDE VASC ACCESS RIGHT  08/01/2018   PLEURAL EFFUSION DRAINAGE Left 07/13/2018   Procedure: DRAINAGE OF LOCULATED PLEURAL EFFUSION;  Surgeon:  Kerin Perna, MD;  Location: [facility] OR;  Service: Thoracic;  Laterality: Left;   POLYPECTOMY  11/15/2020   Procedure: POLYPECTOMY;  Surgeon: Malissa Hippo, MD;  Location: [facility];  Service: Endoscopy;;   REMOVAL OF PLEURAL DRAINAGE CATHETER Left 08/20/2018   Procedure: REMOVAL OF PLEURAL DRAINAGE CATHETER;  Surgeon: Kerin Perna, MD;  Location: [facility] OR;  Service: Thoracic;  Laterality: Left;   REMOVAL OF PLEURAL DRAINAGE CATHETER Left 08/20/2018   Procedure: REMOVAL OF PLEURAL DRAINAGE CATHETER;  Surgeon: Kerin Perna, MD;  Location: [facility] OR;  Service: Thoracic;  Laterality: Left;   TALC PLEURODESIS Left 07/13/2018   Procedure: Talc Pleuradesis;  Surgeon: Donata Clay, Theron Arista, MD;  Location: [facility] OR;  Service: Thoracic;  Laterality: Left;   TOTAL HIP ARTHROPLASTY Left 06/05/2020   Procedure: LEFT TOTAL HIP ARTHROPLASTY ANTERIOR APPROACH;  Surgeon: Tarry Kos, MD;  Location: [facility] OR;  Service: Orthopedics;  Laterality: Left;  3-C   TUBAL LIGATION Bilateral    UTERINE ABLATION     VIDEO ASSISTED THORACOSCOPY Left 07/13/2018   Procedure: VIDEO ASSISTED THORACOSCOPY;  Surgeon: Kerin Perna, MD;  Location: [facility] OR;  Service: Thoracic;  Laterality: Left;    Social History: Social History   Socioeconomic History   Marital status: Widowed    Spouse name: Not on file   Number of children: 2   Years of education: 2-College   Highest education level: Not on file  Occupational History    Employer: [employer]  Tobacco Use   Smoking status: Every Day    Current packs/day: 0.50    Average packs/day: 0.5 packs/day for 17.3 years (8.6 ttl pk-yrs)    Types: Cigarettes    Start date: 06/21/2001    Last attempt to quit: 06/22/2018   Smokeless tobacco: Never  Vaping Use   Vaping status: Never Used  Substance and Sexual Activity   Alcohol use: Not Currently    Alcohol/week: 1.0 standard drink of alcohol    Types: 1 Glasses of wine per week    Comment: occasionally   Drug use: No   Sexual activity:  Not Currently  Other Topics Concern   Not on file  Social History Narrative   Patient lives at home with her daughter.    Patient has 2 children.    Patient is widowed.    Patient is right handed.    Patient has her Associates degree.      Social Drivers of Corporate investment banker Strain: Low Risk  (08/26/2022)   Received from [facility]   Overall Financial Resource Strain (CARDIA)    Difficulty of Paying Living Expenses: Not hard at all  Food Insecurity: No Food Insecurity (02/10/2023)   Hunger Vital Sign    Worried About Running Out of Food  in the Last Year: Never true    Ran Out of Food in the Last Year: Never true  Transportation Needs: No Transportation Needs (02/10/2023)   PRAPARE - Administrator, Civil Service (Medical): No    Lack of Transportation (Non-Medical): No  Physical Activity: Inactive (07/22/2018)   Exercise Vital Sign    Days of Exercise per Week: 0 days    Minutes of Exercise per Session: 0 min  Stress: Stress Concern Present (07/22/2018)   [employer] of Occupational Health - Occupational Stress Questionnaire    Feeling of Stress : Very much  Social Connections: Moderately Isolated (07/22/2018)   Social Connection and Isolation Panel [NHANES]    Frequency of Communication with Friends and Family: More than three times a week    Frequency of Social Gatherings with Friends and Family: More than three times a week    Attends Religious Services: 1 to 4 times per year    Active Member of [employer] or Organizations: No    Attends Banker Meetings: Never    Marital Status: Widowed  Intimate Partner Violence: Not At Risk (02/10/2023)   Humiliation, Afraid, Rape, and Kick questionnaire    Fear of Current or Ex-Partner: No    Emotionally Abused: No    Physically Abused: No    Sexually Abused: No    Family History: Family History  Problem Relation Age of Onset   Depression Mother    Hypertension Mother    Obesity  Mother    Diabetes Mother    Kidney disease Mother    Peripheral vascular disease Father    Atrial fibrillation Father    Crohn's disease Sister    Uterine cancer Sister 41       maternal half sister   COPD Brother    Osteoporosis Brother    Breast cancer Maternal Aunt 50   Colon cancer Neg Hx     Current Medications:  Current Outpatient Medications:    albuterol (VENTOLIN HFA) 108 (90 Base) MCG/ACT inhaler, Inhale 2 puffs into the lungs every 6 (six) hours as needed for wheezing or shortness of breath., Disp: , Rfl:    diazepam (VALIUM) 5 MG tablet, Take 1 tablet (5 mg total) by mouth at bedtime., Disp: 30 tablet, Rfl: 5   dicyclomine (BENTYL) 20 MG tablet, Take 1 tablet (20 mg total) by mouth every 6 (six) hours as needed for spasms., Disp: 120 tablet, Rfl: 1   HYDROmorphone (DILAUDID) 8 MG tablet, Take 1 tablet (8 mg total) by mouth every 4 (four) hours as needed (Breakthrough Pain)., Disp: 180 tablet, Rfl: 0   LORazepam (ATIVAN) 0.5 MG tablet, Take 0.5 mg by mouth 3 (three) times daily as needed for anxiety., Disp: , Rfl:    ondansetron (ZOFRAN-ODT) 4 MG disintegrating tablet, Take 1 tablet (4 mg total) by mouth every 8 (eight) hours as needed for nausea or vomiting., Disp: 90 tablet, Rfl: 2   Oxycodone HCl 20 MG TABS, Take 1 tablet (20 mg total) by mouth every 4 (four) hours., Disp: 180 tablet, Rfl: 0   pantoprazole (PROTONIX) 40 MG tablet, Take 1 tablet (40 mg total) by mouth 2 (two) times daily before a meal., Disp: 60 tablet, Rfl: 2 No current facility-administered medications for this visit.  Facility-Administered Medications Ordered in Other Visits:    0.9 %  sodium chloride infusion, , Intravenous, Continuous, Doreatha Massed, MD, Stopped at 03/26/23 1332   dextrose 5 % solution, , Intravenous, Continuous, Doreatha Massed, MD, Stopped  at 03/26/23 1250   ondansetron (ZOFRAN) injection 8 mg, 8 mg, Intravenous, Once, Doreatha Massed, MD   sodium chloride flush  (NS) 0.9 % injection 10 mL, 10 mL, Intravenous, PRN, Doreatha Massed, MD, 10 mL at 10/23/20 1544   sodium chloride flush (NS) 0.9 % injection 10 mL, 10 mL, Intracatheter, PRN, Doreatha Massed, MD, 10 mL at 03/26/23 1331   Allergies: Allergies  Allergen Reactions   Carboplatin Shortness Of Breath    SOB and flushing of the skin.   Morphine And Codeine Itching   Nickel Itching   Nortriptyline Other (See Comments)    Significant weight gain   Topamax [Topiramate] Diarrhea and Nausea Only   Xanax [Alprazolam] Other (See Comments)    "Can't wake up"   Actifed Cold-Allergy [Chlorpheniramine-Phenyleph Er] Rash and Other (See Comments)    Red dye only   Amoxicillin Rash   Codeine Hives   Erythromycin Rash   Gemzar [Gemcitabine] Other (See Comments)    Feeling flushed, red in the face   Penicillins Rash        Red Dye Rash   Sudafed [Pseudoephedrine Hcl] Rash and Other (See Comments)    Red dye only    REVIEW OF SYSTEMS:   Review of Systems  Constitutional:  Negative for chills, fatigue and fever.  HENT:   Negative for lump/mass, mouth sores, nosebleeds, sore throat and trouble swallowing.   Eyes:  Negative for eye problems.  Respiratory:  Positive for shortness of breath. Negative for cough.   Cardiovascular:  Negative for chest pain, leg swelling and palpitations.  Gastrointestinal:  Positive for diarrhea, nausea and vomiting. Negative for abdominal pain and constipation.  Genitourinary:  Negative for bladder incontinence, difficulty urinating, dysuria, frequency, hematuria and nocturia.   Musculoskeletal:  Negative for arthralgias, back pain, flank pain, myalgias and neck pain.  Skin:  Negative for itching and rash.  Neurological:  Positive for dizziness. Negative for headaches and numbness.  Hematological:  Does not bruise/bleed easily.  Psychiatric/Behavioral:  Negative for depression, sleep disturbance and suicidal ideas. The patient is not nervous/anxious.   All  other systems reviewed and are negative.    VITALS:   There were no vitals taken for this visit.  Wt Readings from Last 3 Encounters:  03/26/23 157 lb 6.5 oz (71.4 kg)  02/27/23 158 lb (71.7 kg)  02/18/23 172 lb 1.6 oz (78.1 kg)    There is no height or weight on file to calculate BMI.  Performance status (ECOG): 2 - Symptomatic, <50% confined to bed  PHYSICAL EXAM:   Physical Exam Vitals and nursing note reviewed. Exam conducted with a chaperone present.  Constitutional:      Appearance: Normal appearance.  Cardiovascular:     Rate and Rhythm: Normal rate and regular rhythm.     Pulses: Normal pulses.     Heart sounds: Normal heart sounds.  Pulmonary:     Effort: Pulmonary effort is normal.     Breath sounds: Normal breath sounds.  Abdominal:     Palpations: Abdomen is soft. There is no hepatomegaly, splenomegaly or mass.     Tenderness: There is no abdominal tenderness.  Musculoskeletal:     Right lower leg: No edema.     Left lower leg: No edema.  Lymphadenopathy:     Cervical: No cervical adenopathy.     Right cervical: No superficial, deep or posterior cervical adenopathy.    Left cervical: No superficial, deep or posterior cervical adenopathy.     Upper Body:  Right upper body: No supraclavicular or axillary adenopathy.     Left upper body: No supraclavicular or axillary adenopathy.  Neurological:     General: No focal deficit present.     Mental Status: She is alert and oriented to person, place, and time.  Psychiatric:        Mood and Affect: Mood normal.        Behavior: Behavior normal.     LABS:   CBC     Component Value Date/Time   WBC 9.5 03/26/2023 0754   RBC 3.46 (L) 03/26/2023 0754   HGB 13.2 03/26/2023 0754   HCT 39.3 03/26/2023 0754   PLT 150 03/26/2023 0754   MCV 113.6 (H) 03/26/2023 0754   MCH 38.2 (H) 03/26/2023 0754   MCHC 33.6 03/26/2023 0754   RDW 21.4 (H) 03/26/2023 0754   LYMPHSABS 3.3 03/26/2023 0754   MONOABS 1.2 (H)  03/26/2023 0754   EOSABS 0.1 03/26/2023 0754   BASOSABS 0.1 03/26/2023 0754    CMP      Component Value Date/Time   NA 137 03/26/2023 0754   K 3.4 (L) 03/26/2023 0754   CL 97 (L) 03/26/2023 0754   CO2 26 03/26/2023 0754   GLUCOSE 109 (H) 03/26/2023 0754   BUN 8 03/26/2023 0754   CREATININE 0.66 03/26/2023 0754   CALCIUM 8.5 (L) 03/26/2023 0754   PROT 6.1 (L) 03/26/2023 0754   ALBUMIN 2.2 (L) 03/26/2023 0754   AST 79 (H) 03/26/2023 0754   ALT 33 03/26/2023 0754   ALKPHOS 459 (H) 03/26/2023 0754   BILITOT 1.3 (H) 03/26/2023 0754   GFRNONAA >60 03/26/2023 0754   GFRAA >60 10/07/2019 1038     No results found for: "CEA1", "CEA" / No results found for: "CEA1", "CEA" No results found for: "PSA1" No results found for: "CAN199" Lab Results  Component Value Date   CAN125 476.0 (H) 02/18/2023    No results found for: "TOTALPROTELP", "ALBUMINELP", "A1GS", "A2GS", "BETS", "BETA2SER", "GAMS", "MSPIKE", "SPEI" Lab Results  Component Value Date   TIBC NOT CALCULATED 03/26/2023   TIBC 288 11/12/2022   TIBC 329 08/05/2022   FERRITIN 899 (H) 03/26/2023   FERRITIN 206 11/12/2022   FERRITIN 219 08/05/2022   IRONPCTSAT NOT CALCULATED 03/26/2023   IRONPCTSAT 17 11/12/2022   IRONPCTSAT 29 08/05/2022   Lab Results  Component Value Date   LDH 277 (H) 07/18/2018   LDH 193 (H) 07/07/2018     STUDIES:   IR IVC FILTER PLMT / S&I Lenise Arena GUID/MOD SED Result Date: 03/08/2023 CLINICAL DATA:  Left lower extremity DVT. GI bleed, a relative contraindication to anticoagulation. Caval filtration requested. EXAM: INFERIOR VENACAVOGRAM IVC FILTER PLACEMENT UNDER FLUOROSCOPY FLUOROSCOPY: Radiation Exposure Index (as provided by the fluoroscopic device): 22 mGy air Kerma TECHNIQUE: Patency of the right IJ vein was confirmed with ultrasound with image documentation. An appropriate skin site was determined. Skin site was marked, prepped with chlorhexidine, and draped using maximum barrier technique. The  region was infiltrated locally with 1% lidocaine. Intravenous Fentanyl and Versed 1mg  were administered by RN during a total moderate (conscious) sedation time of 11 minutes; the patient's level of consciousness and physiological / cardiorespiratory status were monitored continuously by radiology RN under my direct supervision. Under real-time ultrasound guidance, the right IJ vein was accessed with a 21 gauge micropuncture needle; the needle tip within the vein was confirmed with ultrasound image documentation. The needle was exchanged over a 018 guidewire for a transitional dilator, which allow advancement of  the [place] wire into the IVC. A long 6 French vascular sheath was placed for inferior venacavography. This demonstrated no caval thrombus. Renal vein inflows were evident. The Cook Celect IVC filter was advanced through the sheath and successfully deployed under fluoroscopy at the L2-3 level. Followup cavagram demonstrates stable filter position and no evident complication. The sheath was removed and hemostasis achieved at the site. No immediate complication. IMPRESSION: 1. Normal IVC. No thrombus or significant anatomic variation. 2. Technically successful infrarenal IVC filter placement. This is a retrievable model. PLAN: This IVC filter is potentially retrievable. The patient will be assessed for filter retrieval by Interventional Radiology in approximately 8-12 weeks. Further recommendations regarding filter retrieval, continued surveillance or declaration of device permanence, will be made at that time. Electronically Signed   By: Corlis Leak M.D.   On: 03/08/2023 21:47   CT ANGIO GI BLEED Result Date: 03/08/2023 CLINICAL DATA:  GI bleed. Concern for gas distal bleeding. Same day IVC filter placed. Ovarian carcinoma. * Tracking Code: BO * EXAM: CTA ABDOMEN AND PELVIS WITHOUT AND WITH CONTRAST TECHNIQUE: Multidetector CT imaging of the abdomen and pelvis was performed using the standard protocol  during bolus administration of intravenous contrast. Multiplanar reconstructed images and MIPs were obtained and reviewed to evaluate the vascular anatomy. RADIATION DOSE REDUCTION: This exam was performed according to the departmental dose-optimization program which includes automated exposure control, adjustment of the mA and/or kV according to patient size and/or use of iterative reconstruction technique. CONTRAST:  75mL OMNIPAQUE IOHEXOL 350 MG/ML SOLN, 75mL OMNIPAQUE IOHEXOL 350 MG/ML SOLN COMPARISON:  CT 02/12/2023 FINDINGS: VASCULAR Aorta: Normal caliber aorta without aneurysm, dissection, vasculitis or significant stenosis. Celiac: Patent without evidence of aneurysm, dissection, vasculitis or significant stenosis. SMA: Patent without evidence of aneurysm, dissection, vasculitis or significant stenosis. Renals: Both renal arteries are patent without evidence of aneurysm, dissection, vasculitis, fibromuscular dysplasia or significant stenosis. IMA: Patent without evidence of aneurysm, dissection, vasculitis or significant stenosis. Inflow: Patent without evidence of aneurysm, dissection, vasculitis or significant stenosis. Veins: Infrarenal IVC filter noted. Review of the MIP images confirms the above findings. NON-VASCULAR Lower chest: Lung bases are clear. Hepatobiliary: Hepatic steatosis.  Postcholecystectomy. Pancreas: Pancreas is normal. No ductal dilatation. No pancreatic inflammation. 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 No interval change. Adrenals/urinary tract: Adrenal glands and kidneys are normal. The ureters and bladder normal. Stomach/Bowel: Small hiatal hernia. No intraluminal IV contrast to localize active gastrointestinal bleeding within the small bowel or colon. There are fluid collections within leaves of the mesentery. Several these collections have a thin thickened rim. For example in the LEFT lower quadrant along the pericolic gutter thickened  peritoneal measures 3 mm image 46/4. This is not changed from comparison CT. A similar collection in the anterior pelvis on image 64/16. Vascular/Lymphatic: Abdominal aorta is normal caliber. No periportal or retroperitoneal adenopathy. No pelvic adenopathy. Reproductive: Post hysterectomy.  Adnexa unremarkable Other: Peritoneal fluid collections as above. Musculoskeletal: No aggressive osseous lesion. IMPRESSION: VASCULAR 1. No significant arterial vascular abnormality. 2. Infrarenal IVC filter noted. NON-VASCULAR 1. No evidence of active gastrointestinal bleeding within the small bowel or colon. 2. Again demonstrated peritoneal fluid collections in the abdomen pelvis with thickened peritoneal rim. Findings suggest peritoneal carcinomatosis versus peritonitis. Favor peritoneal metastasis in patient with ovarian carcinoma. No interval change from CT 02/12/2023. 3. Hepatic steatosis Electronically Signed   By: Genevive Bi M.D.   On: 03/08/2023 15:23   US Venous Img Lower Unilateral Left Result Date: 03/07/2023  CLINICAL DATA:  Left calf swelling EXAM: LEFT LOWER EXTREMITY VENOUS DOPPLER ULTRASOUND TECHNIQUE: Gray-scale sonography with graded compression, as well as color Doppler and duplex ultrasound were performed to evaluate the lower extremity deep venous systems from the level of the common femoral vein and including the common femoral, femoral, profunda femoral, popliteal and calf veins including the posterior tibial, peroneal and gastrocnemius veins when visible. The superficial great saphenous vein was also interrogated. Spectral Doppler was utilized to evaluate flow at rest and with distal augmentation maneuvers in the common femoral, femoral and popliteal veins. COMPARISON:  None Available. FINDINGS: Contralateral Common Femoral Vein: Respiratory phasicity is normal and symmetric with the symptomatic side. No evidence of thrombus. Normal compressibility. Common Femoral Vein: No evidence of thrombus.  Normal compressibility, respiratory phasicity and response to augmentation. Saphenofemoral Junction: No evidence of thrombus. Normal compressibility and flow on color Doppler imaging. Profunda Femoral Vein: No evidence of thrombus. Normal compressibility and flow on color Doppler imaging. Femoral Vein: Femoral vein is patent and unremarkable in the upper thigh. However, the vessel becomes noncompressible in the mid thigh. No evidence of color flow on color Doppler imaging. Findings are consistent with occlusive thrombus. Occlusive thrombus extends through the distal. Popliteal Vein: Occlusive thrombus extends into the popliteal in pain. Calf Veins: Occlusive thrombus extends into the calf veins. Superficial Great Saphenous Vein: No evidence of thrombus. Normal compressibility. Venous Reflux:  None. Other Findings:  None. IMPRESSION: Positive for acute occlusive DVT in the left lower extremity from the femoral vein in the mid thigh through the popliteal vein and into the proximal calf veins. Electronically Signed   By: Malachy Moan M.D.   On: 03/07/2023 13:30

## 2023-03-26 ENCOUNTER — Inpatient Hospital Stay: Attending: Hematology

## 2023-03-26 ENCOUNTER — Other Ambulatory Visit: Payer: Self-pay | Admitting: *Deleted

## 2023-03-26 ENCOUNTER — Inpatient Hospital Stay

## 2023-03-26 ENCOUNTER — Inpatient Hospital Stay (HOSPITAL_BASED_OUTPATIENT_CLINIC_OR_DEPARTMENT_OTHER): Admitting: Hematology

## 2023-03-26 VITALS — BP 110/72 | HR 99 | Temp 97.9°F | Resp 18 | Wt 157.4 lb

## 2023-03-26 DIAGNOSIS — E876 Hypokalemia: Secondary | ICD-10-CM

## 2023-03-26 DIAGNOSIS — C786 Secondary malignant neoplasm of retroperitoneum and peritoneum: Secondary | ICD-10-CM | POA: Insufficient documentation

## 2023-03-26 DIAGNOSIS — F17210 Nicotine dependence, cigarettes, uncomplicated: Secondary | ICD-10-CM | POA: Diagnosis not present

## 2023-03-26 DIAGNOSIS — D508 Other iron deficiency anemias: Secondary | ICD-10-CM

## 2023-03-26 DIAGNOSIS — Z5112 Encounter for antineoplastic immunotherapy: Secondary | ICD-10-CM | POA: Insufficient documentation

## 2023-03-26 DIAGNOSIS — Z7901 Long term (current) use of anticoagulants: Secondary | ICD-10-CM | POA: Diagnosis not present

## 2023-03-26 DIAGNOSIS — Z95828 Presence of other vascular implants and grafts: Secondary | ICD-10-CM

## 2023-03-26 DIAGNOSIS — I959 Hypotension, unspecified: Secondary | ICD-10-CM

## 2023-03-26 DIAGNOSIS — Z86718 Personal history of other venous thrombosis and embolism: Secondary | ICD-10-CM | POA: Insufficient documentation

## 2023-03-26 DIAGNOSIS — C563 Malignant neoplasm of bilateral ovaries: Secondary | ICD-10-CM

## 2023-03-26 DIAGNOSIS — Z90710 Acquired absence of both cervix and uterus: Secondary | ICD-10-CM | POA: Diagnosis not present

## 2023-03-26 DIAGNOSIS — Z5111 Encounter for antineoplastic chemotherapy: Secondary | ICD-10-CM | POA: Insufficient documentation

## 2023-03-26 DIAGNOSIS — E860 Dehydration: Secondary | ICD-10-CM

## 2023-03-26 DIAGNOSIS — R112 Nausea with vomiting, unspecified: Secondary | ICD-10-CM

## 2023-03-26 DIAGNOSIS — Z79899 Other long term (current) drug therapy: Secondary | ICD-10-CM | POA: Insufficient documentation

## 2023-03-26 LAB — COMPREHENSIVE METABOLIC PANEL WITH GFR
ALT: 33 U/L (ref 0–44)
AST: 79 U/L — ABNORMAL HIGH (ref 15–41)
Albumin: 2.2 g/dL — ABNORMAL LOW (ref 3.5–5.0)
Alkaline Phosphatase: 459 U/L — ABNORMAL HIGH (ref 38–126)
Anion gap: 14 (ref 5–15)
BUN: 8 mg/dL (ref 6–20)
CO2: 26 mmol/L (ref 22–32)
Calcium: 8.5 mg/dL — ABNORMAL LOW (ref 8.9–10.3)
Chloride: 97 mmol/L — ABNORMAL LOW (ref 98–111)
Creatinine, Ser: 0.66 mg/dL (ref 0.44–1.00)
GFR, Estimated: >60 mL/min (ref >60)
Glucose, Bld: 109 mg/dL — ABNORMAL HIGH (ref 70–99)
Potassium: 3.4 mmol/L — ABNORMAL LOW (ref 3.5–5.1)
Sodium: 137 mmol/L (ref 135–145)
Total Bilirubin: 1.3 mg/dL — ABNORMAL HIGH (ref 0.0–1.2)
Total Protein: 6.1 g/dL — ABNORMAL LOW (ref 6.5–8.1)

## 2023-03-26 LAB — URINALYSIS, DIPSTICK ONLY
Glucose, UA: NEGATIVE mg/dL
Hgb urine dipstick: NEGATIVE
Ketones, ur: NEGATIVE mg/dL
Leukocytes,Ua: NEGATIVE
Nitrite: NEGATIVE
Protein, ur: 30 mg/dL — AB
Specific Gravity, Urine: 1.02 (ref 1.005–1.030)
pH: 5 (ref 5.0–8.0)

## 2023-03-26 LAB — CBC WITH DIFFERENTIAL/PLATELET
Abs Immature Granulocytes: 0.04 10*3/uL (ref 0.00–0.07)
Basophils Absolute: 0.1 10*3/uL (ref 0.0–0.1)
Basophils Relative: 1 %
Eosinophils Absolute: 0.1 10*3/uL (ref 0.0–0.5)
Eosinophils Relative: 1 %
HCT: 39.3 % (ref 36.0–46.0)
Hemoglobin: 13.2 g/dL (ref 12.0–15.0)
Immature Granulocytes: 0 %
Lymphocytes Relative: 35 %
Lymphs Abs: 3.3 10*3/uL (ref 0.7–4.0)
MCH: 38.2 pg — ABNORMAL HIGH (ref 26.0–34.0)
MCHC: 33.6 g/dL (ref 30.0–36.0)
MCV: 113.6 fL — ABNORMAL HIGH (ref 80.0–100.0)
Monocytes Absolute: 1.2 10*3/uL — ABNORMAL HIGH (ref 0.1–1.0)
Monocytes Relative: 13 %
Neutro Abs: 4.7 10*3/uL (ref 1.7–7.7)
Neutrophils Relative %: 50 %
Platelets: 150 10*3/uL (ref 150–400)
RBC: 3.46 MIL/uL — ABNORMAL LOW (ref 3.87–5.11)
RDW: 21.4 % — ABNORMAL HIGH (ref 11.5–15.5)
WBC: 9.5 10*3/uL (ref 4.0–10.5)
nRBC: 0 % (ref 0.0–0.2)

## 2023-03-26 LAB — FERRITIN: Ferritin: 899 ng/mL — ABNORMAL HIGH (ref 11–307)

## 2023-03-26 LAB — IRON AND TIBC: Iron: 81 ug/dL (ref 28–170)

## 2023-03-26 LAB — MAGNESIUM: Magnesium: 1.4 mg/dL — ABNORMAL LOW (ref 1.7–2.4)

## 2023-03-26 MED ORDER — HEPARIN SOD (PORK) LOCK FLUSH 100 UNIT/ML IV SOLN
500.0000 [IU] | Freq: Once | INTRAVENOUS | Status: AC | PRN
  Administered 2023-03-26: 500 [IU]

## 2023-03-26 MED ORDER — SODIUM CHLORIDE 0.9% FLUSH
10.0000 mL | INTRAVENOUS | Status: DC | PRN
  Administered 2023-03-26: 10 mL

## 2023-03-26 MED ORDER — ONDANSETRON HCL 4 MG/2ML IJ SOLN
8.0000 mg | Freq: Once | INTRAMUSCULAR | Status: DC
Start: 2023-03-26 — End: 2023-04-02
  Filled 2023-03-26: qty 4

## 2023-03-26 MED ORDER — BEVACIZUMAB-AWWB CHEMO INJECTION 400 MG/16ML
10.0000 mg/kg | Freq: Once | INTRAVENOUS | Status: AC
  Administered 2023-03-26: 700 mg via INTRAVENOUS
  Filled 2023-03-26: qty 16

## 2023-03-26 MED ORDER — MAGNESIUM SULFATE 2 GM/50ML IV SOLN
2.0000 g | Freq: Once | INTRAVENOUS | Status: AC
  Administered 2023-03-26: 2 g via INTRAVENOUS
  Filled 2023-03-26: qty 50

## 2023-03-26 MED ORDER — PALONOSETRON HCL INJECTION 0.25 MG/5ML
0.2500 mg | Freq: Once | INTRAVENOUS | Status: AC
  Administered 2023-03-26: 0.25 mg via INTRAVENOUS
  Filled 2023-03-26: qty 5

## 2023-03-26 MED ORDER — OXYCODONE HCL 20 MG PO TABS: 20.0000 mg | ORAL_TABLET | ORAL | 0 refills | Status: DC

## 2023-03-26 MED ORDER — DOXORUBICIN HCL LIPOSOMAL CHEMO INJECTION 2 MG/ML
40.0000 mg/m2 | Freq: Once | INTRAVENOUS | Status: AC
  Administered 2023-03-26: 80 mg via INTRAVENOUS
  Filled 2023-03-26: qty 40

## 2023-03-26 MED ORDER — DEXTROSE 5 % IV SOLN: INTRAVENOUS | Status: DC

## 2023-03-26 MED ORDER — OXYCODONE HCL 5 MG PO TABS
20.0000 mg | ORAL_TABLET | Freq: Once | ORAL | Status: AC
  Administered 2023-03-26: 20 mg via ORAL
  Filled 2023-03-26: qty 4

## 2023-03-26 MED ORDER — POTASSIUM CHLORIDE CRYS ER 20 MEQ PO TBCR
20.0000 meq | EXTENDED_RELEASE_TABLET | Freq: Once | ORAL | Status: AC
  Administered 2023-03-26: 20 meq via ORAL
  Filled 2023-03-26: qty 1

## 2023-03-26 MED ORDER — SODIUM CHLORIDE 0.9 % IV SOLN: INTRAVENOUS | Status: DC

## 2023-03-26 MED ORDER — DEXAMETHASONE SODIUM PHOSPHATE 10 MG/ML IJ SOLN
10.0000 mg | Freq: Once | INTRAMUSCULAR | Status: AC
  Administered 2023-03-26: 10 mg via INTRAVENOUS
  Filled 2023-03-26: qty 1

## 2023-03-26 MED ORDER — POTASSIUM CHLORIDE IN NACL 20-0.9 MEQ/L-% IV SOLN
Freq: Once | INTRAVENOUS | Status: AC
  Filled 2023-03-26: qty 1000

## 2023-03-26 NOTE — Patient Instructions (Signed)
 [facility] - A DEPT OF [facility]  Discharge Instructions: Thank you for choosing [facility] to provide your oncology and hematology care.  If you have a lab appointment with the Cancer Center - please note that after April 8th, 2024, all labs will be drawn in the cancer center.  You do not have to check in or register with the main entrance as you have in the past but will complete your check-in in the cancer center.  Wear comfortable clothing and clothing appropriate for easy access to any Portacath or PICC line.   We strive to give you quality time with your provider. You may need to reschedule your appointment if you arrive late (15 or more minutes).  Arriving late affects you and other patients whose appointments are after yours.  Also, if you miss three or more appointments without notifying the office, you may be dismissed from the clinic at the provider's discretion.      For prescription refill requests, have your pharmacy contact our office and allow 72 hours for refills to be completed.    Today you received the following chemotherapy and/or immunotherapy agents MVASI/Doxil.  Bevacizumab Injection What is this medication? BEVACIZUMAB (be va SIZ yoo mab) treats some types of cancer. It works by blocking a protein that causes cancer cells to grow and multiply. This helps to slow or stop the spread of cancer cells. It is a monoclonal antibody. This medicine may be used for other purposes; ask your health care provider or pharmacist if you have questions. COMMON BRAND NAME(S): Alymsys, Avastin, MVASI, [place] Lao What should I tell my care team before I take this medication? They need to know if you have any of these conditions: Blood clots Coughing up blood Having or recent surgery Heart failure High blood pressure History of a connection between 2 or more body parts that do not usually connect (fistula) History of a tear in  your stomach or intestines Protein in your urine An unusual or allergic reaction to bevacizumab, other medications, foods, dyes, or preservatives Pregnant or trying to get pregnant Breast-feeding How should I use this medication? This medication is injected into a vein. It is given by your care team in a hospital or clinic setting. Talk to your care team the use of this medication in children. Special care may be needed. Overdosage: If you think you have taken too much of this medicine contact a poison control center or emergency room at once. NOTE: This medicine is only for you. Do not share this medicine with others. What if I miss a dose? Keep appointments for follow-up doses. It is important not to miss your dose. Call your care team if you are unable to keep an appointment. What may interact with this medication? Interactions are not expected. This list may not describe all possible interactions. Give your health care provider a list of all the medicines, herbs, non-prescription drugs, or dietary supplements you use. Also tell them if you smoke, drink alcohol, or use illegal drugs. Some items may interact with your medicine. What should I watch for while using this medication? Your condition will be monitored carefully while you are receiving this medication. You may need blood work while taking this medication. This medication may make you feel generally unwell. This is not uncommon as chemotherapy can affect healthy cells as well as cancer cells. Report any side effects. Continue your course of treatment even though you feel  ill unless your care team tells you to stop. This medication may increase your risk to bruise or bleed. Call your care team if you notice any unusual bleeding. Before having surgery, talk to your care team to make sure it is ok. This medication can increase the risk of poor healing of your surgical site or wound. You will need to stop this medication for 28 days before  surgery. After surgery, wait at least 28 days before restarting this medication. Make sure the surgical site or wound is healed enough before restarting this medication. Talk to your care team if questions. Talk to your care team if you may be pregnant. Serious birth defects can occur if you take this medication during pregnancy and for 6 months after the last dose. Contraception is recommended while taking this medication and for 6 months after the last dose. Your care team can help you find the option that works for you. Do not breastfeed while taking this medication and for 6 months after the last dose. This medication can cause infertility. Talk to your care team if you are concerned about your fertility. What side effects may I notice from receiving this medication? Side effects that you should report to your care team as soon as possible: Allergic reactions--skin rash, itching, hives, swelling of the face, lips, tongue, or throat Bleeding--bloody or black, tar-like stools, vomiting blood or Mickayla Trouten material that looks like coffee grounds, red or dark Tafari Humiston urine, small red or purple spots on skin, unusual bruising or bleeding Blood clot--pain, swelling, or warmth in the leg, shortness of breath, chest pain Heart attack--pain or tightness in the chest, shoulders, arms, or jaw, nausea, shortness of breath, cold or clammy skin, feeling faint or lightheaded Heart failure--shortness of breath, swelling of the ankles, feet, or hands, sudden weight gain, unusual weakness or fatigue Increase in blood pressure Infection--fever, chills, cough, sore throat, wounds that don't heal, pain or trouble when passing urine, general feeling of discomfort or being unwell Infusion reactions--chest pain, shortness of breath or trouble breathing, feeling faint or lightheaded Kidney injury--decrease in the amount of urine, swelling of the ankles, hands, or feet Stomach pain that is severe, does not go away, or gets  worse Stroke--sudden numbness or weakness of the face, arm, or leg, trouble speaking, confusion, trouble walking, loss of balance or coordination, dizziness, severe headache, change in vision Sudden and severe headache, confusion, change in vision, seizures, which may be signs of posterior reversible encephalopathy syndrome (PRES) Side effects that usually do not require medical attention (report to your care team if they continue or are bothersome): Back pain Change in taste Diarrhea Dry skin Increased tears Nosebleed This list may not describe all possible side effects. Call your doctor for medical advice about side effects. You may report side effects to FDA at 1-800-FDA-1088. Where should I keep my medication? This medication is given in a hospital or clinic. It will not be stored at home. NOTE: This sheet is a summary. It may not cover all possible information. If you have questions about this medicine, talk to your doctor, pharmacist, or health care provider.  2024 Elsevier/Gold Standard (2021-05-18 00:00:00)   Doxorubicin Liposomal Injection What is this medication? DOXORUBICIN LIPOSOMAL (dox oh ROO bi sin LIP oh som al) treats some types of cancer. It works by slowing down the growth of cancer cells. This medicine may be used for other purposes; ask your health care provider or pharmacist if you have questions. COMMON BRAND NAME(S): Doxil, Lipodox  What should I tell my care team before I take this medication? They need to know if you have any of these conditions: Blood disorder Heart disease Infection especially a viral infection, such as chickenpox, cold sores, herpes Liver disease Recent or ongoing radiation An unusual or allergic reaction to doxorubicin, soybeans, other medications, foods, dyes, or preservatives If you or your partner are pregnant or trying to get pregnant Breast-feeding How should I use this medication? This medication is injected into a vein. It is  given by your care team in a hospital or clinic setting. Talk to your care team about the use of this medication in children. Special care may be needed. Overdosage: If you think you have taken too much of this medicine contact a poison control center or emergency room at once. NOTE: This medicine is only for you. Do not share this medicine with others. What if I miss a dose? Keep appointments for follow-up doses. It is important not to miss your dose. Call your care team if you are unable to keep an appointment. What may interact with this medication? Do not take this medication with any of the following: Zidovudine This medication may also interact with the following: Medications to increase blood counts, such as filgrastim, pegfilgrastim, sargramostim Vaccines This list may not describe all possible interactions. Give your health care provider a list of all the medicines, herbs, non-prescription drugs, or dietary supplements you use. Also tell them if you smoke, drink alcohol, or use illegal drugs. Some items may interact with your medicine. What should I watch for while using this medication? Your condition will be monitored carefully while you are receiving this medication. You may need blood work while taking this medication. This medication may make you feel generally unwell. This is not uncommon as chemotherapy can affect healthy cells as well as cancer cells. Report any side effects. Continue your course of treatment even though you feel ill unless your care team tells you to stop. Your urine may turn orange-red for a few days after your dose. This is not blood. If your urine is dark or Tai Skelly, call your care team. In some cases, you may be given additional medications to help with side effects. Follow all directions for their use. Talk to your care team about your risk of cancer. You may be more at risk for certain types of cancers if you take this medication. Talk to your care team if  you or your partner may be pregnant. Serious birth defects can occur if you take this medication during pregnancy and for 6 months after the last dose. Contraception is recommended while taking this medication and for 6 months after the last dose. Your care team can help you find the option that works for you. If your partner can get pregnant, use a condom while taking this medication and for 6 months after the last dose. Do not breastfeed while taking this medication. This medication may cause infertility. Talk to your care team if you are concerned about your fertility. What side effects may I notice from receiving this medication? Side effects that you should report to your care team as soon as possible: Allergic reactions--skin rash, itching, hives, swelling of the face, lips, tongue, or throat Heart failure--shortness of breath, swelling of the ankles, feet, or hands, sudden weight gain, unusual weakness or fatigue Infection--fever, chills, cough, sore throat, wounds that don't heal, pain or trouble when passing urine, general feeling of discomfort or being unwell Infusion reactions--chest pain,  shortness of breath or trouble breathing, feeling faint or lightheaded Low red blood cell level--unusual weakness or fatigue, dizziness, headache, trouble breathing Redness, swelling, and blistering of the skin over hands and feet Unusual bruising or bleeding Side effects that usually do not require medical attention (report to your care team if they continue or are bothersome): Constipation Diarrhea Loss of appetite Nausea Pain, redness, or swelling with sores inside the mouth or throat Red urine Unusual weakness or fatigue This list may not describe all possible side effects. Call your doctor for medical advice about side effects. You may report side effects to FDA at 1-800-FDA-1088. Where should I keep my medication? This medication is given in a hospital or clinic. It will not be stored at  home. NOTE: This sheet is a summary. It may not cover all possible information. If you have questions about this medicine, talk to your doctor, pharmacist, or health care provider.  2024 Elsevier/Gold Standard (2022-04-04 00:00:00)       To help prevent nausea and vomiting after your treatment, we encourage you to take your nausea medication as directed.  BELOW ARE SYMPTOMS THAT SHOULD BE REPORTED IMMEDIATELY: *FEVER GREATER THAN 100.4 F (38 C) OR HIGHER *CHILLS OR SWEATING *NAUSEA AND VOMITING THAT IS NOT CONTROLLED WITH YOUR NAUSEA MEDICATION *UNUSUAL SHORTNESS OF BREATH *UNUSUAL BRUISING OR BLEEDING *URINARY PROBLEMS (pain or burning when urinating, or frequent urination) *BOWEL PROBLEMS (unusual diarrhea, constipation, pain near the anus) TENDERNESS IN MOUTH AND THROAT WITH OR WITHOUT PRESENCE OF ULCERS (sore throat, sores in mouth, or a toothache) UNUSUAL RASH, SWELLING OR PAIN  UNUSUAL VAGINAL DISCHARGE OR ITCHING   Items with * indicate a potential emergency and should be followed up as soon as possible or go to the Emergency Department if any problems should occur.  Please show the CHEMOTHERAPY ALERT CARD or IMMUNOTHERAPY ALERT CARD at check-in to the Emergency Department and triage nurse.  Should you have questions after your visit or need to cancel or reschedule your appointment, please contact [facility] [place] - A DEPT OF [facility]igh[facility] [phone]  and follow the prompts.  Office hours are 8:00 a.m. to 4:30 p.m. Monday - Friday. Please note that voicemails left after 4:00 p.m. may not be returned until the following business day.  We are closed weekends and major holidays. You have access to a nurse at all times for urgent questions. Please call the main number to the clinic [phone] and follow the prompts.  For any non-urgent questions, you may also contact your provider using MyChart. We now offer e-Visits for anyone 31 and older to request care  online for non-urgent symptoms. For details visit [email or web address].   Also download the MyChart app! Go to the app store, search "MyChart", open the app, select [facility], and log in with your MyChart username and password.

## 2023-03-26 NOTE — Patient Instructions (Addendum)
 [facility] at [facility] Discharge Instructions   You were seen and examined today by Dr. Ellin Saba.  He reviewed the results of your lab work which are normal/stable.   We will your treatment today. Dr. Kirtland Bouchard discussed changing treatment to a medication called Doxil. It is given once a month. He will continue Avastin as well. This antibody would aid in keeping the fluid down. This is given every 2 weeks. He also discussed with you that if you don't feel like you can tolerate treatments, hospice is a reasonable option at this point.   Return as scheduled.    Thank you for choosing [facility] at [facility] to provide your oncology and hematology care.  To afford each patient quality time with our provider, please arrive at least 15 minutes before your scheduled appointment time.   If you have a lab appointment with the Cancer Center please come in thru the Main Entrance and check in at the main information desk.  You need to re-schedule your appointment should you arrive 10 or more minutes late.  We strive to give you quality time with our providers, and arriving late affects you and other patients whose appointments are after yours.  Also, if you no show three or more times for appointments you may be dismissed from the clinic at the providers discretion.     Again, thank you for choosing [facility].  Our hope is that these requests will decrease the amount of time that you wait before being seen by our physicians.       _____________________________________________________________  Should you have questions after your visit to [facility], please contact our office at [phone] and follow the prompts.  Our office hours are 8:00 a.m. and 4:30 p.m. Monday - Friday.  Please note that voicemails left after 4:00 p.m. may not be returned until the following business day.  We are closed weekends and major holidays.  You do  have access to a nurse 24-7, just call the main number to the clinic [phone] and do not press any options, hold on the line and a nurse will answer the phone.    For prescription refill requests, have your pharmacy contact our office and allow 72 hours.    Due to Covid, you will need to wear a mask upon entering the hospital. If you do not have a mask, a mask will be given to you at the Main Entrance upon arrival. For doctor visits, patients may have 1 support person age 44 or older with them. For treatment visits, patients can not have anyone with them due to social distancing guidelines and our immunocompromised population.

## 2023-03-26 NOTE — Progress Notes (Signed)
 We will start house IVF prior to office visit per Dr. Ellin Saba due to hypotension.   Patient presents today for chemotherapy infusion. Treatment is being changed to Doxil/MVASI today.  Patient c/o nausea/vomiting this morning.  Husband states this is not new for her.  Vital signs are stable.  Labs reviewed by Dr. Ellin Saba during the office visit and all labs are within treatment parameters.  Magnesium today is 1.4 and potassium is 3.4.  We will give magnesium sulfate 4 grams IV x one dose today and Klor Con 20 mEq PO x one dose today per standing orders by Dr. Ellin Saba.  We will proceed with treatment per MD orders.   Urine protein is 30.  Baseline ECHO also done on 07/24/2015.    Abdominal pain 10/10 per patient.  Oxycodone 20 mg PO x one dose given to patient per Dr. Ellin Saba.   Patient tolerated treatment well with no complaints voiced.  Patient left via wheelchair in stable condition.  Vital signs stable at discharge.  Follow up as scheduled.

## 2023-03-26 NOTE — Progress Notes (Signed)
 DISCONTINUE ON PATHWAY REGIMEN - Ovarian     A cycle is every 21 days:     Gemcitabine      Carboplatin   **Always confirm dose/schedule in your pharmacy ordering system**  PRIOR TREATMENT: [id number]: Carboplatin AUC=4 D1 + Gemcitabine 800 mg/m2 D1, 8 q21 Days; Re-evaluate Every 3 Cycles, Treat until Complete Response, Unacceptable Toxicity, or Disease Progression    Patient Characteristics: Recurrent or Progressive Disease, Third Line, Platinum Resistant or < 6 Months Since Last Platinum Therapy, Low, Medium, or Unknown Folate Receptor Alpha Expression OR  Prior Mirvetuximab OR Not a Candidate for Mirvetuximab BRCA Mutation Status: Absent Therapeutic Status: Recurrent or Progressive Disease Line of Therapy: Third Line Folate Receptor Alpha (FR?) Expression: Low Mirvetuximab Candidacy: Not a Candidate for Mirvetuximab OR Prior Mirvetuximab

## 2023-03-26 NOTE — Progress Notes (Signed)
 START ON PATHWAY REGIMEN - Ovarian     A cycle is every 28 days:     Bevacizumab-xxxx      Liposomal doxorubicin   **Always confirm dose/schedule in your pharmacy ordering system**  Patient Characteristics: Recurrent or Progressive Disease, Third Line, Platinum Resistant or < 6 Months Since Last Platinum Therapy, Low, Medium, or Unknown Folate Receptor Alpha Expression OR  Prior Mirvetuximab OR Not a Candidate for Mirvetuximab, HER2 Expression  Equivocal/Negative/Unknown (IHC ? 2+) BRCA Mutation Status: Absent Therapeutic Status: Recurrent or Progressive Disease Line of Therapy: Third Line Folate Receptor Alpha (FR?) Expression: Low Mirvetuximab Candidacy: Not a Candidate for Mirvetuximab OR Prior Mirvetuximab HER2 Expression Status by IHC: Negative (IHC 0, 1+) Intent of Therapy: Non-Curative / Palliative Intent, Discussed with Patient

## 2023-03-26 NOTE — Progress Notes (Signed)
 Patient has been examined by Dr. Ellin Saba. Vital signs and labs have been reviewed by MD - ANC, Creatinine, LFTs, hemoglobin, and platelets are within treatment parameters per M.D. - pt may proceed with treatment.  Primary RN and pharmacy notified.

## 2023-03-27 ENCOUNTER — Other Ambulatory Visit: Payer: Self-pay

## 2023-03-27 ENCOUNTER — Inpatient Hospital Stay

## 2023-03-27 ENCOUNTER — Encounter (INDEPENDENT_AMBULATORY_CARE_PROVIDER_SITE_OTHER): Payer: 59 | Admitting: Gastroenterology

## 2023-03-27 LAB — CA 125: Cancer Antigen (CA) 125: 193 U/mL — ABNORMAL HIGH (ref 0.0–38.1)

## 2023-03-27 LAB — SURGICAL PATHOLOGY

## 2023-03-27 NOTE — Progress Notes (Signed)
 24 hour follow up- Called to speak to pt, complaints of headache, abd pain. She has taken some tylenol, she is eating , LBM yesterday. No other issues. Patient seems in good spirits.

## 2023-03-28 ENCOUNTER — Encounter: Payer: Self-pay | Admitting: Internal Medicine

## 2023-04-02 ENCOUNTER — Encounter: Payer: Self-pay | Admitting: Hematology

## 2023-04-02 ENCOUNTER — Encounter (HOSPITAL_COMMUNITY): Payer: Self-pay | Admitting: Hematology

## 2023-04-02 ENCOUNTER — Inpatient Hospital Stay

## 2023-04-02 NOTE — Telephone Encounter (Signed)
 FYI

## 2023-04-03 ENCOUNTER — Inpatient Hospital Stay

## 2023-04-03 ENCOUNTER — Other Ambulatory Visit: Payer: Self-pay | Admitting: *Deleted

## 2023-04-03 ENCOUNTER — Encounter: Payer: Self-pay | Admitting: Hematology

## 2023-04-03 ENCOUNTER — Telehealth: Payer: Self-pay | Admitting: *Deleted

## 2023-04-03 VITALS — BP 90/65 | HR 95 | Temp 98.7°F | Resp 19

## 2023-04-03 DIAGNOSIS — C786 Secondary malignant neoplasm of retroperitoneum and peritoneum: Secondary | ICD-10-CM | POA: Diagnosis not present

## 2023-04-03 DIAGNOSIS — F17210 Nicotine dependence, cigarettes, uncomplicated: Secondary | ICD-10-CM | POA: Diagnosis not present

## 2023-04-03 DIAGNOSIS — Z86718 Personal history of other venous thrombosis and embolism: Secondary | ICD-10-CM | POA: Diagnosis not present

## 2023-04-03 DIAGNOSIS — R319 Hematuria, unspecified: Secondary | ICD-10-CM

## 2023-04-03 DIAGNOSIS — R17 Unspecified jaundice: Secondary | ICD-10-CM

## 2023-04-03 DIAGNOSIS — R112 Nausea with vomiting, unspecified: Secondary | ICD-10-CM

## 2023-04-03 DIAGNOSIS — C563 Malignant neoplasm of bilateral ovaries: Secondary | ICD-10-CM

## 2023-04-03 DIAGNOSIS — Z5112 Encounter for antineoplastic immunotherapy: Secondary | ICD-10-CM | POA: Diagnosis not present

## 2023-04-03 DIAGNOSIS — E860 Dehydration: Secondary | ICD-10-CM

## 2023-04-03 DIAGNOSIS — Z7901 Long term (current) use of anticoagulants: Secondary | ICD-10-CM | POA: Diagnosis not present

## 2023-04-03 DIAGNOSIS — Z5111 Encounter for antineoplastic chemotherapy: Secondary | ICD-10-CM | POA: Diagnosis not present

## 2023-04-03 DIAGNOSIS — Z79899 Other long term (current) drug therapy: Secondary | ICD-10-CM | POA: Diagnosis not present

## 2023-04-03 LAB — URINALYSIS, DIPSTICK ONLY
Bacteria, UA: NONE SEEN
Glucose, UA: NEGATIVE mg/dL
Hgb urine dipstick: NEGATIVE
Ketones, ur: NEGATIVE mg/dL
Leukocytes,Ua: NEGATIVE
Nitrite: NEGATIVE
Protein, ur: 100 mg/dL — AB
Specific Gravity, Urine: 1.027 (ref 1.005–1.030)
pH: 6 (ref 5.0–8.0)

## 2023-04-03 LAB — COMPREHENSIVE METABOLIC PANEL
ALT: 42 U/L (ref 0–44)
AST: 95 U/L — ABNORMAL HIGH (ref 15–41)
Albumin: 1.9 g/dL — ABNORMAL LOW (ref 3.5–5.0)
Alkaline Phosphatase: 337 U/L — ABNORMAL HIGH (ref 38–126)
Anion gap: 15 (ref 5–15)
BUN: 13 mg/dL (ref 6–20)
CO2: 23 mmol/L (ref 22–32)
Calcium: 8.3 mg/dL — ABNORMAL LOW (ref 8.9–10.3)
Chloride: 89 mmol/L — ABNORMAL LOW (ref 98–111)
Creatinine, Ser: 0.77 mg/dL (ref 0.44–1.00)
GFR, Estimated: >60 mL/min (ref >60)
Glucose, Bld: 117 mg/dL — ABNORMAL HIGH (ref 70–99)
Potassium: 4 mmol/L (ref 3.5–5.1)
Sodium: 127 mmol/L — ABNORMAL LOW (ref 135–145)
Total Bilirubin: 4.2 mg/dL — ABNORMAL HIGH (ref 0.0–1.2)
Total Protein: 6.6 g/dL (ref 6.5–8.1)

## 2023-04-03 LAB — CBC WITH DIFFERENTIAL/PLATELET
Abs Immature Granulocytes: 0 10*3/uL (ref 0.00–0.07)
Basophils Absolute: 0 10*3/uL (ref 0.0–0.1)
Basophils Relative: 0 %
Eosinophils Absolute: 0 10*3/uL (ref 0.0–0.5)
Eosinophils Relative: 0 %
HCT: 33.2 % — ABNORMAL LOW (ref 36.0–46.0)
Hemoglobin: 11.2 g/dL — ABNORMAL LOW (ref 12.0–15.0)
Lymphocytes Relative: 5 %
Lymphs Abs: 0.8 10*3/uL (ref 0.7–4.0)
MCH: 37.6 pg — ABNORMAL HIGH (ref 26.0–34.0)
MCHC: 33.7 g/dL (ref 30.0–36.0)
MCV: 111.4 fL — ABNORMAL HIGH (ref 80.0–100.0)
Monocytes Absolute: 0.2 10*3/uL (ref 0.1–1.0)
Monocytes Relative: 1 %
Neutro Abs: 14.9 10*3/uL — ABNORMAL HIGH (ref 1.7–7.7)
Neutrophils Relative %: 94 %
Platelets: 245 10*3/uL (ref 150–400)
RBC: 2.98 MIL/uL — ABNORMAL LOW (ref 3.87–5.11)
RDW: 19.8 % — ABNORMAL HIGH (ref 11.5–15.5)
Smear Review: ADEQUATE
WBC: 15.8 10*3/uL — ABNORMAL HIGH (ref 4.0–10.5)
nRBC: 0 % (ref 0.0–0.2)

## 2023-04-03 LAB — BILIRUBIN, DIRECT: Bilirubin, Direct: 2.4 mg/dL — ABNORMAL HIGH (ref 0.0–0.2)

## 2023-04-03 LAB — MAGNESIUM: Magnesium: 1.6 mg/dL — ABNORMAL LOW (ref 1.7–2.4)

## 2023-04-03 MED ORDER — SODIUM CHLORIDE 0.9% FLUSH
3.0000 mL | Freq: Once | INTRAVENOUS | Status: DC | PRN
Start: 2023-04-03 — End: 2023-04-03

## 2023-04-03 MED ORDER — SODIUM CHLORIDE 0.9 % IV SOLN: Freq: Once | INTRAVENOUS | Status: DC | PRN

## 2023-04-03 MED ORDER — HEPARIN SOD (PORK) LOCK FLUSH 100 UNIT/ML IV SOLN
500.0000 [IU] | Freq: Once | INTRAVENOUS | Status: AC | PRN
  Administered 2023-04-03: 500 [IU]

## 2023-04-03 MED ORDER — ALTEPLASE 2 MG IJ SOLR: 2.0000 mg | Freq: Once | INTRAMUSCULAR | Status: DC | PRN

## 2023-04-03 MED ORDER — ALBUTEROL SULFATE (2.5 MG/3ML) 0.083% IN NEBU
2.5000 mg | INHALATION_SOLUTION | Freq: Once | RESPIRATORY_TRACT | Status: DC | PRN
Start: 2023-04-03 — End: 2023-04-03

## 2023-04-03 MED ORDER — FAMOTIDINE IN NACL 20-0.9 MG/50ML-% IV SOLN: 20.0000 mg | Freq: Once | INTRAVENOUS | Status: DC | PRN

## 2023-04-03 MED ORDER — METHYLPREDNISOLONE SODIUM SUCC 125 MG IJ SOLR: 125.0000 mg | Freq: Once | INTRAMUSCULAR | Status: DC | PRN

## 2023-04-03 MED ORDER — FAMOTIDINE 20 MG PO TABS: 20.0000 mg | ORAL_TABLET | Freq: Once | ORAL | Status: DC

## 2023-04-03 MED ORDER — MAGNESIUM SULFATE 2 GM/50ML IV SOLN
2.0000 g | Freq: Once | INTRAVENOUS | Status: AC
  Administered 2023-04-03: 2 g via INTRAVENOUS
  Filled 2023-04-03: qty 50

## 2023-04-03 MED ORDER — POTASSIUM CHLORIDE IN NACL 20-0.9 MEQ/L-% IV SOLN
Freq: Once | INTRAVENOUS | Status: AC
  Filled 2023-04-03: qty 1000

## 2023-04-03 MED ORDER — DIPHENHYDRAMINE HCL 50 MG/ML IJ SOLN: 50.0000 mg | Freq: Once | INTRAMUSCULAR | Status: DC | PRN

## 2023-04-03 MED ORDER — EPINEPHRINE 0.3 MG/0.3ML IJ SOAJ: 0.3000 mg | Freq: Once | INTRAMUSCULAR | Status: DC | PRN

## 2023-04-03 MED ORDER — SODIUM CHLORIDE 0.9% FLUSH
10.0000 mL | Freq: Once | INTRAVENOUS | Status: DC | PRN
Start: 2023-04-03 — End: 2023-04-03

## 2023-04-03 MED ORDER — SODIUM CHLORIDE 0.9 % IV SOLN: 510.0000 mg | Freq: Once | INTRAVENOUS | Status: DC

## 2023-04-03 MED ORDER — SODIUM CHLORIDE 0.9% FLUSH
10.0000 mL | Freq: Once | INTRAVENOUS | Status: AC
  Administered 2023-04-03: 10 mL via INTRAVENOUS

## 2023-04-03 MED ORDER — ONDANSETRON HCL 4 MG/2ML IJ SOLN
8.0000 mg | Freq: Once | INTRAMUSCULAR | Status: AC
  Administered 2023-04-03: 8 mg via INTRAVENOUS
  Filled 2023-04-03: qty 4

## 2023-04-03 MED ORDER — LORATADINE 10 MG PO TABS: 10.0000 mg | ORAL_TABLET | Freq: Once | ORAL | Status: DC

## 2023-04-03 MED ORDER — LORAZEPAM 2 MG/ML IJ SOLN
0.5000 mg | Freq: Once | INTRAMUSCULAR | Status: AC
  Administered 2023-04-03: 0.5 mg via INTRAVENOUS
  Filled 2023-04-03: qty 1

## 2023-04-03 NOTE — Telephone Encounter (Signed)
 Patient called to advise that she has been unable to eat or drink much, associated with nausea and vomiting.  States that urine is red as well with no other associated symptoms.  Per Dr. Ellin Saba, will bring in for labs and fluids.  Patient aware.

## 2023-04-03 NOTE — Progress Notes (Signed)
 After zofran 8mg  IV given at 1442, pt states at 1600 that she still feels very nauseous and her stomach aches. MD made aware. Per MD to give pt Ativan 0.5mg  IV at this time.pt updated and agrees to plan. Per MD pt may be discharge after ativan is given and the  potassium/magnesium fluids finish. See MAR for details. Vitals stable. RN reviewed medication list with patient and reviewed over the importance of rotating PRN emetics medications to help relieve her nausea. Pt verbalize understanding and all questions answered at this time.  Pt left in stable condition and escorted out via wheelchair to be driven home by friend.   Michelle Holt [employer]

## 2023-04-08 ENCOUNTER — Other Ambulatory Visit: Payer: Self-pay

## 2023-04-08 NOTE — Progress Notes (Incomplete)
 [facility] [address], [place] [address]    Clinic Day:  04/08/2023  Referring physician: Ignatius Specking, MD  Patient Care Team: Ignatius Specking, MD as PCP - General (Internal Medicine) Doreatha Massed, MD as Medical Oncologist (Oncology)   ASSESSMENT & PLAN:   Assessment: 1.  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 -Robotic assisted laparoscopic TAH and BSO and omentectomy on 12/24/2018, pathology showing high-grade serous carcinoma, PT3P NX. -Germline mutation testing was negative. -3 more cycles of adjuvant chemotherapy completed on 03/15/2019. -CTAP on 04/13/2019 showed no findings of active malignancy.  28% reduction in the volume of presumed chronic hematoma/chronic fluid collection splaying the upper margin of the spleen.  Large type III hiatal hernia. -CTAP on 10/13/2019 shows no findings of recurrence or metastatic disease. - Foundation 1 shows HRD+, LOH score>16%.  MSI-stable.  MYC amplification.  T p53 mutation. - We reviewed CT CAP from 02/21/2021 which showed progressive peritoneal metastasis compared to 12/06/2020 scan.  No bowel obstruction.  Small right pleural effusion with mild enlargement of an isolated left external iliac lymph node. - Reviewed EGD from 02/28/2021 which showed hiatal hernia and normal findings. - She reported epigastric and left upper quadrant pain worse in the last 1 week which is related to progression of her malignancy. - 6 cycles of carboplatin and paclitaxel from 03/13/2021 through 06/18/2021 - CT CAP (07/12/2021): No evidence of recurrence or metastatic disease. - Olaparib 300 mg twice daily from 08/22/2021 through 11/28/2022 with progression - Cycle 1 carboplatin, gemcitabine and bevacizumab on 01/07/2023    Plan: 1.  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 [facility] admission from 03/07/2023 through 03/10/2023 with BRBPR and left leg pain.  She was diagnosed with left leg DVT.  She underwent IVC filter placement on 03/08/2023.  She had EGD and colonoscopy on 03/09/2023.  EGD found esophageal ulcer with no bleeding, gastritis, nonbleeding duodenal ulcer.  Colonoscopy found diverticulosis and internal hemorrhoids.  She received 1 unit PRBC.  She was started on PPI twice daily for 12 weeks. - Reviewed labs today: Mildly elevated LFTs and low albumin.  CBC grossly normal. - She reports feeling very weak and has occasional epigastric pain. - I reviewed her recent CT scan which did not show any progression.  However she had reaction to carboplatin.  Hence I have recommended switching of therapy.  We also discussed best supportive care in the form of hospice.  She wants to give 1 last trial with chemotherapy.  I will switch her to Doxil 40 mg/m every 28 days with Avastin every 2 weeks.  We discussed side effects in detail.  Previous echo in 2017 was normal. - Today she is hypotensive.  Will give her 1 L of fluids without lites.  She will proceed with cycle 1 of Doxil today.  RTC 2 weeks for follow-up.   2.  Lower back/left upper quadrant pain: - She is taking Dilaudid 8 mg every 8 hours.  However she feels very nauseous. - I will switch her to oxycodone 20 mg every 4-6 hours as needed.  She will stop taking Dilaudid.   3.  Left leg DVT (diagnosis 03/06/2022): - Left leg ultrasound showed acute DVT in the left leg from femoral vein in the  mid thigh through popliteal vein into the proximal calf veins. - She had IVC filter placement on 03/08/2023.  We are holding off on anticoagulation at this time.  4.  Iron deficiency state: - Hemoglobin improved to 13.2 today.  No indication for parenteral iron therapy.  5.  Constipation: - Continue stool  softeners.  6.  Hypomagnesemia: - She is taking magnesium once daily.  She will increase magnesium to twice daily as her magnesium is low.      No orders of the defined types were placed in this encounter.     Michelle Holt,acting as a Neurosurgeon for Doreatha Massed, MD.,have documented all relevant documentation on the behalf of Doreatha Massed, MD,as directed by  Doreatha Massed, MD while in the presence of Doreatha Massed, MD.  ***   Michelle Holt   3/25/20259:01 AM  CHIEF COMPLAINT:   Diagnosis: high-grade serous ovarian cancer    Cancer Staging  No matching staging information was found for the patient.    Prior Therapy: 1. Carboplatin and paclitaxel x 7 cycles from 08/24/2018 to 03/15/2019. 2. Laparoscopic TAH & BSO & omenectomy on 12/24/2018. 3.  6 cycles of carboplatin and paclitaxel from 03/05/2021 to 06/18/2021 4. Olaparib, 08/22/21 - 11/28/22  Current Therapy:  carboplatin, gemcitabine and bevacizumab    HISTORY OF PRESENT ILLNESS:   Oncology History  Ovarian cancer, bilateral (HCC)  07/07/2018 Pathology Results   PLEURAL FLUID, LEFT (SPECIMEN 1 OF 1, COLLECTED 07/07/18): - MALIGNANT CELLS CONSISTENT WITH ADENOCARCINOMA - SEE COMMENT  Source Pleural Fluid, (Specimen 1 of 1, collected on 07/07/2018) Gross Specimen: Received is/are 1000cc of bloody red fluid with tissue. (TC:tc) Prepared: # Smears: 0 # Concentration Technique Slides (i.e. ThinPrep): 1 # Cell Block: 1 Conventional Additional Studies: Two Hematology slides labeled [id number] Comment The malignant cells are positive for cytokeratin 7, p53, WT-1, Pax-8, Moc31, ER (weak) and EMA but negative for cytokeratin 20, TTF-1, GATA-3, CDX-2 and D2-40. Overall, the phenotype is consistent with a gynecologic primary; clinical correlation recommended.   07/07/2018 Procedure   Successful ultrasound guided left thoracentesis yielding 2.0 L of pleural fluid   07/08/2018 Procedure   1. Technically  successful placement of left 14 French pigtail chest drain, placed to Pleur-evac water-seal.   07/08/2018 Procedure   1. Technically successful five [place] double lumen power injectable PICC placement   07/09/2018 Imaging   Ct chest 1. There is a moderate, loculated left hydropneumothorax with a small air component and moderate fluid component. The largest loculated component is located posteriorly. There is a pigtail drainage catheter about the lateral pleural space. There is no obvious etiology, such as obvious mass or pleural disease.   2. There is a small right pleural effusion with associated atelectasis or consolidation and a subpleural consolidation of the superior segment right lower lobe (series 4, image 56), of uncertain significance, possibly infectious or inflammatory   07/10/2018 Imaging   Ct abdomen and pelvis: 1. The bilateral ovaries are enlarged by heterogeneous appearing cystic lesions, measuring 5.3 x 4.2 cm on the right (series 4, image 72) and 4.5 x 3.2 cm on the left (series 4, image 75). Consider dedicated pelvic ultrasound and/or pelvic MRI to further evaluate for solid components given high suspicion for GYN primary malignancy.   2. No other evidence of mass and no lymphadenopathy in the abdomen or pelvis.   3. Trace ascites. There is some suggestion of omental and peritoneal nodularity (e.g. Series 4, image 43), concerning for peritoneal metastatic disease.  4. Loculated left-sided pleural effusion with left-sided pleural drainage catheter in position. Small right pleural effusion   07/13/2018 Surgery   OPERATION: 1.  Left VATS (video-assisted thoracoscopic surgery) for drainage of loculated pleural effusion. 2.  Talc pleurodesis for malignant pleural effusion. 3.  Placement of PleurX catheter for management of malignant pleural effusion. 4.  Placement of On-Q analgesia catheter system.    PREOPERATIVE DIAGNOSIS:  Large malignant left pleural effusion, probable  adenocarcinoma of the ovary by cytology.   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   07/13/2018 Pathology Results   Pleura, peel, Left Pleural - FIBRO-FIBRINOUS PLEURITIS - NEGATIVE FOR MALIGNANCY   07/18/2018 Initial Diagnosis   Ovarian cancer, bilateral (HCC)   07/20/2018 Procedure   EGD impression: Normal proximal esophagus and mid esophagus. Mild distal esophageal rings; dilation not performed because of esophagitis. LA Grade C reflux esophagitis. Z-line regular, 30 cm from the incisors. 5 cm hiatal hernia. Non-bleeding gastric ulcer with no stigmata of bleeding. Gastritis. Duodenal erosions without bleeding. Normal second portion of the duodenum. No specimens collected.   08/19/2018 Genetic Testing   Negative genetic testing on the common hereditary cancer panel.  The Common Hereditary Gene Panel offered by [facility] includes sequencing and/or deletion duplication testing of the following 48 genes: APC, ATM, AXIN2, BARD1, BMPR1A, BRCA1, BRCA2, BRIP1, CDH1, CDK4, CDKN2A (p14ARF), CDKN2A (p16INK4a), CHEK2, CTNNA1, DICER1, EPCAM (Deletion/duplication testing only), GREM1 (promoter region deletion/duplication testing only), KIT, MEN1, MLH1, MSH2, MSH3, MSH6, MUTYH, NBN, NF1, NHTL1, PALB2, PDGFRA, PMS2, POLD1, POLE, PTEN, RAD50, RAD51C, RAD51D, RNF43, SDHB, SDHC, SDHD, SMAD4, SMARCA4. STK11, TP53, TSC1, TSC2, and VHL.  The following genes were evaluated for sequence changes only: SDHA and HOXB13 c.251G>A variant only. The report date is August 19, 2018.   08/24/2018 - 03/15/2019 Chemotherapy   Patient is on Treatment Plan : OVARIAN Carboplatin (AUC 6) / Paclitaxel (175) q21d x 6 cycles     03/05/2021 - 06/18/2021 Chemotherapy   Patient is on Treatment Plan : OVARIAN Carboplatin (AUC 6) / Paclitaxel (175) q21d x 6 cycles     01/07/2023 - 02/27/2023 Chemotherapy   Patient is on Treatment Plan : OVARIAN RECURRENT 3RD LINE  Carboplatin D1 + Gemcitabine D1,8 (4/800) q21d     03/26/2023 -  Chemotherapy   Patient is on Treatment Plan : OVARIAN Liposomal Doxorubicin + Bevacizumab q28d        INTERVAL HISTORY:   Michelle Holt is a 53 y.o. female presenting to clinic today for follow up of high-grade serous ovarian cancer. She was last seen by me on 03/26/23.  Today, she states that she is doing well overall. Her appetite level is at ***%. Her energy level is at ***%.  PAST MEDICAL HISTORY:   Past Medical History: Past Medical History:  Diagnosis Date   Anemia    Anxiety and depression    Arthritis of facet joints at multiple vertebral levels    L5-S1   Constipation    Dyslipidemia    Family history of breast cancer    Family history of uterine cancer    GERD (gastroesophageal reflux disease)    History of hiatal hernia    History of kidney stones    Insomnia    Irritable bowel syndrome    Migraine    Muscle tension headache    Neuropathy of finger    Ovarian carcinoma (HCC)    ovarian   Plantar fasciitis of right foot    Port-A-Cath in  place 08/20/2018    Surgical History: Past Surgical History:  Procedure Laterality Date   ABDOMINAL HYSTERECTOMY     BIOPSY  10/27/2020   Procedure: BIOPSY;  Surgeon: Dolores Frame, MD;  [facility];  Service: Gastroenterology;;   BIOPSY  03/09/2023   Procedure: BIOPSY;  Surgeon: Imogene Burn, MD;  Location: [facility] ENDOSCOPY;  Service: Gastroenterology;;   CHOLECYSTECTOMY  2008   COLONOSCOPY N/A 08/13/2013   Procedure: COLONOSCOPY;  Surgeon: Malissa Hippo, MD;  [facility]: [facility];  Service: Endoscopy;  Laterality: N/A;  230-moved to 145 Ann to notify pt   COLONOSCOPY WITH PROPOFOL N/A 11/15/2020   Procedure: COLONOSCOPY WITH PROPOFOL;  Surgeon: Malissa Hippo, MD;  [facility]: [facility];  Service: Endoscopy;  Laterality: N/A;  1:40   COLONOSCOPY WITH PROPOFOL N/A 03/09/2023   Procedure: COLONOSCOPY WITH PROPOFOL;  Surgeon: Imogene Burn,  MD;  Location: [facility] ENDOSCOPY;  Service: Gastroenterology;  Laterality: N/A;   ESOPHAGEAL DILATION N/A 02/28/2021   Procedure: ESOPHAGEAL DILATION;  Surgeon: Malissa Hippo, MD;  [facility]: [facility];  Service: Endoscopy;  Laterality: N/A;   ESOPHAGOGASTRODUODENOSCOPY     ESOPHAGOGASTRODUODENOSCOPY (EGD) WITH PROPOFOL N/A 07/20/2018   Procedure: ESOPHAGOGASTRODUODENOSCOPY (EGD) WITH PROPOFOL;  Surgeon: Malissa Hippo, MD;  [facility];  Service: Endoscopy;  Laterality: N/A;  Possible esophageal dilation.   ESOPHAGOGASTRODUODENOSCOPY (EGD) WITH PROPOFOL N/A 10/27/2020   Procedure: ESOPHAGOGASTRODUODENOSCOPY (EGD) WITH PROPOFOL;  Surgeon: Dolores Frame, MD;  [facility];  Service: Gastroenterology;  Laterality: N/A;  2:10, pt knows to arrive at 10:15   ESOPHAGOGASTRODUODENOSCOPY (EGD) WITH PROPOFOL N/A 02/28/2021   Procedure: ESOPHAGOGASTRODUODENOSCOPY (EGD) WITH PROPOFOL;  Surgeon: Malissa Hippo, MD;  [facility];  Service: Endoscopy;  Laterality: N/A;  200 ASA 1   ESOPHAGOGASTRODUODENOSCOPY (EGD) WITH PROPOFOL N/A 03/29/2022   Procedure: ESOPHAGOGASTRODUODENOSCOPY (EGD) WITH PROPOFOL;  Surgeon: Dolores Frame, MD;  [facility];  Service: Gastroenterology;  Laterality: N/A;  2:00 pm, asa 1-2   ESOPHAGOGASTRODUODENOSCOPY (EGD) WITH PROPOFOL N/A 03/09/2023   Procedure: ESOPHAGOGASTRODUODENOSCOPY (EGD) WITH PROPOFOL;  Surgeon: Imogene Burn, MD;  Location: [facility] ENDOSCOPY;  Service: Gastroenterology;  Laterality: N/A;   GIVENS CAPSULE STUDY N/A 11/24/2020   Procedure: GIVENS CAPSULE STUDY;  Surgeon: Malissa Hippo, MD;  [facility];  Service: Endoscopy;  Laterality: N/A;  7:30   IR ANGIOGRAM SELECTIVE EACH ADDITIONAL VESSEL  08/01/2018   IR ANGIOGRAM VISCERAL SELECTIVE  08/01/2018   IR EMBO ART  VEN HEMORR LYMPH EXTRAV  INC GUIDE ROADMAPPING  08/01/2018   IR IMAGING GUIDED PORT INSERTION  08/20/2018   IR IVC FILTER PLMT /  S&I /IMG GUID/MOD SED  03/08/2023   IR PERC PLEURAL DRAIN W/INDWELL CATH W/IMG GUIDE  07/08/2018   IR THORACENTESIS ASP PLEURAL SPACE W/IMG GUIDE  07/07/2018   IR US GUIDE VASC ACCESS RIGHT  08/01/2018   PLEURAL EFFUSION DRAINAGE Left 07/13/2018   Procedure: DRAINAGE OF LOCULATED PLEURAL EFFUSION;  Surgeon: Kerin Perna, MD;  Location: [facility] OR;  Service: Thoracic;  Laterality: Left;   POLYPECTOMY  11/15/2020   Procedure: POLYPECTOMY;  Surgeon: Malissa Hippo, MD;  [facility];  Service: Endoscopy;;   REMOVAL OF PLEURAL DRAINAGE CATHETER Left 08/20/2018   Procedure: REMOVAL OF PLEURAL DRAINAGE CATHETER;  Surgeon: Kerin Perna, MD;  Location[facility] OR;  Service: Thoracic;  Laterality: Left;   REMOVAL OF PLEURAL DRAINAGE CATHETER Left 08/20/2018   Procedure:  REMOVAL OF PLEURAL DRAINAGE CATHETER;  Surgeon: Kerin Perna, MD;  Location: [facility] OR;  Service: Thoracic;  Laterality: Left;   TALC PLEURODESIS Left 07/13/2018   Procedure: Talc Pleuradesis;  Surgeon: Donata Clay, Theron Arista, MD;  Location: [facility] OR;  Service: Thoracic;  Laterality: Left;   TOTAL HIP ARTHROPLASTY Left 06/05/2020   Procedure: LEFT TOTAL HIP ARTHROPLASTY ANTERIOR APPROACH;  Surgeon: Tarry Kos, MD;  Location: [facility] OR;  Service: Orthopedics;  Laterality: Left;  3-C   TUBAL LIGATION Bilateral    UTERINE ABLATION     VIDEO ASSISTED THORACOSCOPY Left 07/13/2018   Procedure: VIDEO ASSISTED THORACOSCOPY;  Surgeon: Kerin Perna, MD;  Location: [facility] OR;  Service: Thoracic;  Laterality: Left;    Social History: Social History   Socioeconomic History   Marital status: Widowed    Spouse name: Not on file   Number of children: 2   Years of education: 2-College   Highest education level: Not on file  Occupational History    Employer: [employer]  Tobacco Use   Smoking status: Every Day    Current packs/day: 0.50    Average packs/day: 0.5 packs/day for 17.3 years (8.7 ttl pk-yrs)    Types: Cigarettes    Start date: 06/21/2001    Last  attempt to quit: 06/22/2018   Smokeless tobacco: Never  Vaping Use   Vaping status: Never Used  Substance and Sexual Activity   Alcohol use: Not Currently    Alcohol/week: 1.0 standard drink of alcohol    Types: 1 Glasses of wine per week    Comment: occasionally   Drug use: No   Sexual activity: Not Currently  Other Topics Concern   Not on file  Social History Narrative   Patient lives at home with her daughter.    Patient has 2 children.    Patient is widowed.    Patient is right handed.    Patient has her Associates degree.      Social Drivers of Corporate investment banker Strain: Low Risk  (08/26/2022)   Received from [facility]   Overall Financial Resource Strain (CARDIA)    Difficulty of Paying Living Expenses: Not hard at all  Food Insecurity: No Food Insecurity (02/10/2023)   Hunger Vital Sign    Worried About Running Out of Food in the Last Year: Never true    Ran Out of Food in the Last Year: Never true  Transportation Needs: No Transportation Needs (02/10/2023)   PRAPARE - Administrator, Civil Service (Medical): No    Lack of Transportation (Non-Medical): No  Physical Activity: Inactive (07/22/2018)   Exercise Vital Sign    Days of Exercise per Week: 0 days    Minutes of Exercise per Session: 0 min  Stress: Stress Concern Present (07/22/2018)   [employer] of Occupational Health - Occupational Stress Questionnaire    Feeling of Stress : Very much  Social Connections: Moderately Isolated (07/22/2018)   Social Connection and Isolation Panel [NHANES]    Frequency of Communication with Friends and Family: More than three times a week    Frequency of Social Gatherings with Friends and Family: More than three times a week    Attends Religious Services: 1 to 4 times per year    Active Member of [employer] or Organizations: No    Attends Banker Meetings: Never    Marital Status: Widowed  Intimate Partner Violence: Not At Risk  (02/10/2023)   Humiliation, Afraid, Rape,  and Kick questionnaire    Fear of Current or Ex-Partner: No    Emotionally Abused: No    Physically Abused: No    Sexually Abused: No    Family History: Family History  Problem Relation Age of Onset   Depression Mother    Hypertension Mother    Obesity Mother    Diabetes Mother    Kidney disease Mother    Peripheral vascular disease Father    Atrial fibrillation Father    Crohn's disease Sister    Uterine cancer Sister 72       maternal half sister   COPD Brother    Osteoporosis Brother    Breast cancer Maternal Aunt 50   Colon cancer Neg Hx     Current Medications:  Current Outpatient Medications:    albuterol (VENTOLIN HFA) 108 (90 Base) MCG/ACT inhaler, Inhale 2 puffs into the lungs every 6 (six) hours as needed for wheezing or shortness of breath., Disp: , Rfl:    diazepam (VALIUM) 5 MG tablet, Take 1 tablet (5 mg total) by mouth at bedtime., Disp: 30 tablet, Rfl: 5   dicyclomine (BENTYL) 20 MG tablet, Take 1 tablet (20 mg total) by mouth every 6 (six) hours as needed for spasms., Disp: 120 tablet, Rfl: 1   HYDROmorphone (DILAUDID) 8 MG tablet, Take 1 tablet (8 mg total) by mouth every 4 (four) hours as needed (Breakthrough Pain)., Disp: 180 tablet, Rfl: 0   LORazepam (ATIVAN) 0.5 MG tablet, Take 0.5 mg by mouth 3 (three) times daily as needed for anxiety., Disp: , Rfl:    ondansetron (ZOFRAN-ODT) 4 MG disintegrating tablet, Take 1 tablet (4 mg total) by mouth every 8 (eight) hours as needed for nausea or vomiting., Disp: 90 tablet, Rfl: 2   Oxycodone HCl 20 MG TABS, Take 1 tablet (20 mg total) by mouth every 4 (four) hours., Disp: 180 tablet, Rfl: 0   pantoprazole (PROTONIX) 40 MG tablet, Take 1 tablet (40 mg total) by mouth 2 (two) times daily before a meal., Disp: 60 tablet, Rfl: 2 No current facility-administered medications for this visit.  Facility-Administered Medications Ordered in Other Visits:    sodium chloride flush  (NS) 0.9 % injection 10 mL, 10 mL, Intravenous, PRN, Doreatha Massed, MD, 10 mL at 10/23/20 1544   Allergies: Allergies  Allergen Reactions   Carboplatin Shortness Of Breath    SOB and flushing of the skin.   Morphine And Codeine Itching   Nickel Itching   Nortriptyline Other (See Comments)    Significant weight gain   Topamax [Topiramate] Diarrhea and Nausea Only   Xanax [Alprazolam] Other (See Comments)    "Can't wake up"   Actifed Cold-Allergy [Chlorpheniramine-Phenyleph Er] Rash and Other (See Comments)    Red dye only   Amoxicillin Rash   Codeine Hives   Erythromycin Rash   Gemzar [Gemcitabine] Other (See Comments)    Feeling flushed, red in the face   Penicillins Rash        Red Dye Rash   Sudafed [Pseudoephedrine Hcl] Rash and Other (See Comments)    Red dye only    REVIEW OF SYSTEMS:   Review of Systems  Constitutional:  Negative for chills, fatigue and fever.  HENT:   Negative for lump/mass, mouth sores, nosebleeds, sore throat and trouble swallowing.   Eyes:  Negative for eye problems.  Respiratory:  Negative for cough and shortness of breath.   Cardiovascular:  Negative for chest pain, leg swelling and palpitations.  Gastrointestinal:  Negative for abdominal pain, constipation, diarrhea, nausea and vomiting.  Genitourinary:  Negative for bladder incontinence, difficulty urinating, dysuria, frequency, hematuria and nocturia.   Musculoskeletal:  Negative for arthralgias, back pain, flank pain, myalgias and neck pain.  Skin:  Negative for itching and rash.  Neurological:  Negative for dizziness, headaches and numbness.  Hematological:  Does not bruise/bleed easily.  Psychiatric/Behavioral:  Negative for depression, sleep disturbance and suicidal ideas. The patient is not nervous/anxious.   All other systems reviewed and are negative.    VITALS:   There were no vitals taken for this visit.  Wt Readings from Last 3 Encounters:  03/26/23 157 lb 6.5 oz  (71.4 kg)  02/27/23 158 lb (71.7 kg)  02/18/23 172 lb 1.6 oz (78.1 kg)    There is no height or weight on file to calculate BMI.  Performance status (ECOG): 2 - Symptomatic, <50% confined to bed  PHYSICAL EXAM:   Physical Exam Vitals and nursing note reviewed. Exam conducted with a chaperone present.  Constitutional:      Appearance: Normal appearance.  Cardiovascular:     Rate and Rhythm: Normal rate and regular rhythm.     Pulses: Normal pulses.     Heart sounds: Normal heart sounds.  Pulmonary:     Effort: Pulmonary effort is normal.     Breath sounds: Normal breath sounds.  Abdominal:     Palpations: Abdomen is soft. There is no hepatomegaly, splenomegaly or mass.     Tenderness: There is no abdominal tenderness.  Musculoskeletal:     Right lower leg: No edema.     Left lower leg: No edema.  Lymphadenopathy:     Cervical: No cervical adenopathy.     Right cervical: No superficial, deep or posterior cervical adenopathy.    Left cervical: No superficial, deep or posterior cervical adenopathy.     Upper Body:     Right upper body: No supraclavicular or axillary adenopathy.     Left upper body: No supraclavicular or axillary adenopathy.  Neurological:     General: No focal deficit present.     Mental Status: She is alert and oriented to person, place, and time.  Psychiatric:        Mood and Affect: Mood normal.        Behavior: Behavior normal.     LABS:   CBC     Component Value Date/Time   WBC 15.8 (H) 04/03/2023 1318   RBC 2.98 (L) 04/03/2023 1318   HGB 11.2 (L) 04/03/2023 1318   HCT 33.2 (L) 04/03/2023 1318   PLT 245 04/03/2023 1318   MCV 111.4 (H) 04/03/2023 1318   MCH 37.6 (H) 04/03/2023 1318   MCHC 33.7 04/03/2023 1318   RDW 19.8 (H) 04/03/2023 1318   LYMPHSABS 0.8 04/03/2023 1318   MONOABS 0.2 04/03/2023 1318   EOSABS 0.0 04/03/2023 1318   BASOSABS 0.0 04/03/2023 1318    CMP      Component Value Date/Time   NA 127 (L) 04/03/2023 1318   K  4.0 04/03/2023 1318   CL 89 (L) 04/03/2023 1318   CO2 23 04/03/2023 1318   GLUCOSE 117 (H) 04/03/2023 1318   BUN 13 04/03/2023 1318   CREATININE 0.77 04/03/2023 1318   CALCIUM 8.3 (L) 04/03/2023 1318   PROT 6.6 04/03/2023 1318   ALBUMIN 1.9 (L) 04/03/2023 1318   AST 95 (H) 04/03/2023 1318   ALT 42 04/03/2023 1318   ALKPHOS 337 (H) 04/03/2023 1318   BILITOT 4.2 (H)  04/03/2023 1318   GFRNONAA >60 04/03/2023 1318   GFRAA >60 10/07/2019 1038     No results found for: "CEA1", "CEA" / No results found for: "CEA1", "CEA" No results found for: "PSA1" No results found for: "CAN199" Lab Results  Component Value Date   CAN125 193.0 (H) 03/26/2023    No results found for: "TOTALPROTELP", "ALBUMINELP", "A1GS", "A2GS", "BETS", "BETA2SER", "GAMS", "MSPIKE", "SPEI" Lab Results  Component Value Date   TIBC NOT CALCULATED 03/26/2023   TIBC 288 11/12/2022   TIBC 329 08/05/2022   FERRITIN 899 (H) 03/26/2023   FERRITIN 206 11/12/2022   FERRITIN 219 08/05/2022   IRONPCTSAT NOT CALCULATED 03/26/2023   IRONPCTSAT 17 11/12/2022   IRONPCTSAT 29 08/05/2022   Lab Results  Component Value Date   LDH 277 (H) 07/18/2018   LDH 193 (H) 07/07/2018     STUDIES:   No results found.

## 2023-04-09 ENCOUNTER — Inpatient Hospital Stay

## 2023-04-09 ENCOUNTER — Inpatient Hospital Stay: Admitting: Hematology

## 2023-04-22 ENCOUNTER — Other Ambulatory Visit: Payer: Self-pay

## 2023-04-23 ENCOUNTER — Inpatient Hospital Stay

## 2023-04-23 ENCOUNTER — Telehealth: Payer: Self-pay | Admitting: *Deleted

## 2023-04-23 ENCOUNTER — Inpatient Hospital Stay: Admitting: Hematology

## 2023-04-23 NOTE — Telephone Encounter (Signed)
 Spoke to niece, Morrie Sheldon regarding appointments for today, as patient did not show. Has not been out of the bed for 2 weeks and niece is having to give her a bed bath. For last 2 days has had to use pull ups. Urine has foul odor and burning when she urinates. Not eating or drinking well. Trying to encourage her to call 911 for her to go to hospital. Discussed in home hospice, due to her declining status and inability to come to office for treatments.  She will address with patient and call us with decision.

## 2023-04-24 ENCOUNTER — Telehealth: Payer: Self-pay | Admitting: *Deleted

## 2023-04-24 ENCOUNTER — Other Ambulatory Visit: Payer: Self-pay | Admitting: *Deleted

## 2023-04-24 MED ORDER — OXYCODONE HCL 20 MG PO TABS: 20.0000 mg | ORAL_TABLET | ORAL | 0 refills | Status: DC

## 2023-04-24 MED ORDER — HYDROMORPHONE HCL 8 MG PO TABS: 8.0000 mg | ORAL_TABLET | ORAL | 0 refills | Status: DC | PRN

## 2023-04-24 NOTE — Telephone Encounter (Signed)
 Opened in error

## 2023-04-25 ENCOUNTER — Emergency Department (HOSPITAL_COMMUNITY)

## 2023-04-25 ENCOUNTER — Inpatient Hospital Stay (HOSPITAL_COMMUNITY)
Admission: EM | Admit: 2023-04-25 | Discharge: 2023-05-15 | DRG: 871 | Disposition: E | Attending: Family Medicine | Admitting: Family Medicine

## 2023-04-25 ENCOUNTER — Encounter (HOSPITAL_COMMUNITY): Payer: Self-pay

## 2023-04-25 ENCOUNTER — Other Ambulatory Visit: Payer: Self-pay

## 2023-04-25 DIAGNOSIS — R6889 Other general symptoms and signs: Secondary | ICD-10-CM | POA: Diagnosis not present

## 2023-04-25 DIAGNOSIS — Z833 Family history of diabetes mellitus: Secondary | ICD-10-CM

## 2023-04-25 DIAGNOSIS — E871 Hypo-osmolality and hyponatremia: Secondary | ICD-10-CM | POA: Diagnosis present

## 2023-04-25 DIAGNOSIS — D696 Thrombocytopenia, unspecified: Secondary | ICD-10-CM | POA: Diagnosis present

## 2023-04-25 DIAGNOSIS — E861 Hypovolemia: Secondary | ICD-10-CM | POA: Diagnosis present

## 2023-04-25 DIAGNOSIS — Z96642 Presence of left artificial hip joint: Secondary | ICD-10-CM | POA: Diagnosis present

## 2023-04-25 DIAGNOSIS — I499 Cardiac arrhythmia, unspecified: Secondary | ICD-10-CM | POA: Diagnosis not present

## 2023-04-25 DIAGNOSIS — R531 Weakness: Secondary | ICD-10-CM | POA: Diagnosis not present

## 2023-04-25 DIAGNOSIS — N390 Urinary tract infection, site not specified: Secondary | ICD-10-CM

## 2023-04-25 DIAGNOSIS — Z8249 Family history of ischemic heart disease and other diseases of the circulatory system: Secondary | ICD-10-CM

## 2023-04-25 DIAGNOSIS — Z66 Do not resuscitate: Secondary | ICD-10-CM | POA: Diagnosis present

## 2023-04-25 DIAGNOSIS — Z9049 Acquired absence of other specified parts of digestive tract: Secondary | ICD-10-CM

## 2023-04-25 DIAGNOSIS — Z6824 Body mass index (BMI) 24.0-24.9, adult: Secondary | ICD-10-CM | POA: Diagnosis not present

## 2023-04-25 DIAGNOSIS — R579 Shock, unspecified: Secondary | ICD-10-CM | POA: Diagnosis not present

## 2023-04-25 DIAGNOSIS — Z9221 Personal history of antineoplastic chemotherapy: Secondary | ICD-10-CM | POA: Diagnosis not present

## 2023-04-25 DIAGNOSIS — N3000 Acute cystitis without hematuria: Secondary | ICD-10-CM | POA: Diagnosis not present

## 2023-04-25 DIAGNOSIS — R0609 Other forms of dyspnea: Secondary | ICD-10-CM | POA: Diagnosis not present

## 2023-04-25 DIAGNOSIS — E785 Hyperlipidemia, unspecified: Secondary | ICD-10-CM | POA: Diagnosis not present

## 2023-04-25 DIAGNOSIS — E43 Unspecified severe protein-calorie malnutrition: Secondary | ICD-10-CM | POA: Diagnosis present

## 2023-04-25 DIAGNOSIS — Z95828 Presence of other vascular implants and grafts: Secondary | ICD-10-CM

## 2023-04-25 DIAGNOSIS — Z841 Family history of disorders of kidney and ureter: Secondary | ICD-10-CM

## 2023-04-25 DIAGNOSIS — Z880 Allergy status to penicillin: Secondary | ICD-10-CM

## 2023-04-25 DIAGNOSIS — A419 Sepsis, unspecified organism: Principal | ICD-10-CM | POA: Diagnosis present

## 2023-04-25 DIAGNOSIS — D630 Anemia in neoplastic disease: Secondary | ICD-10-CM | POA: Diagnosis not present

## 2023-04-25 DIAGNOSIS — R231 Pallor: Secondary | ICD-10-CM | POA: Diagnosis not present

## 2023-04-25 DIAGNOSIS — Z888 Allergy status to other drugs, medicaments and biological substances status: Secondary | ICD-10-CM

## 2023-04-25 DIAGNOSIS — F419 Anxiety disorder, unspecified: Secondary | ICD-10-CM | POA: Diagnosis present

## 2023-04-25 DIAGNOSIS — Z825 Family history of asthma and other chronic lower respiratory diseases: Secondary | ICD-10-CM

## 2023-04-25 DIAGNOSIS — Z8719 Personal history of other diseases of the digestive system: Secondary | ICD-10-CM

## 2023-04-25 DIAGNOSIS — R300 Dysuria: Secondary | ICD-10-CM | POA: Diagnosis present

## 2023-04-25 DIAGNOSIS — C563 Malignant neoplasm of bilateral ovaries: Secondary | ICD-10-CM | POA: Diagnosis present

## 2023-04-25 DIAGNOSIS — Z8262 Family history of osteoporosis: Secondary | ICD-10-CM

## 2023-04-25 DIAGNOSIS — Z87442 Personal history of urinary calculi: Secondary | ICD-10-CM

## 2023-04-25 DIAGNOSIS — Z79899 Other long term (current) drug therapy: Secondary | ICD-10-CM

## 2023-04-25 DIAGNOSIS — Z90710 Acquired absence of both cervix and uterus: Secondary | ICD-10-CM

## 2023-04-25 DIAGNOSIS — Z515 Encounter for palliative care: Secondary | ICD-10-CM | POA: Diagnosis not present

## 2023-04-25 DIAGNOSIS — Z803 Family history of malignant neoplasm of breast: Secondary | ICD-10-CM

## 2023-04-25 DIAGNOSIS — K219 Gastro-esophageal reflux disease without esophagitis: Secondary | ICD-10-CM | POA: Diagnosis not present

## 2023-04-25 DIAGNOSIS — Z818 Family history of other mental and behavioral disorders: Secondary | ICD-10-CM

## 2023-04-25 DIAGNOSIS — E876 Hypokalemia: Secondary | ICD-10-CM | POA: Diagnosis not present

## 2023-04-25 DIAGNOSIS — E860 Dehydration: Secondary | ICD-10-CM | POA: Diagnosis not present

## 2023-04-25 DIAGNOSIS — R112 Nausea with vomiting, unspecified: Secondary | ICD-10-CM | POA: Diagnosis present

## 2023-04-25 DIAGNOSIS — I9589 Other hypotension: Secondary | ICD-10-CM | POA: Diagnosis not present

## 2023-04-25 DIAGNOSIS — R5381 Other malaise: Secondary | ICD-10-CM | POA: Diagnosis present

## 2023-04-25 DIAGNOSIS — Z86718 Personal history of other venous thrombosis and embolism: Secondary | ICD-10-CM

## 2023-04-25 DIAGNOSIS — C786 Secondary malignant neoplasm of retroperitoneum and peritoneum: Secondary | ICD-10-CM | POA: Diagnosis present

## 2023-04-25 DIAGNOSIS — R1114 Bilious vomiting: Secondary | ICD-10-CM

## 2023-04-25 DIAGNOSIS — R17 Unspecified jaundice: Secondary | ICD-10-CM | POA: Diagnosis not present

## 2023-04-25 DIAGNOSIS — I959 Hypotension, unspecified: Secondary | ICD-10-CM | POA: Diagnosis present

## 2023-04-25 DIAGNOSIS — R6521 Severe sepsis with septic shock: Secondary | ICD-10-CM | POA: Diagnosis not present

## 2023-04-25 DIAGNOSIS — I825Y2 Chronic embolism and thrombosis of unspecified deep veins of left proximal lower extremity: Secondary | ICD-10-CM | POA: Diagnosis not present

## 2023-04-25 DIAGNOSIS — Z743 Need for continuous supervision: Secondary | ICD-10-CM | POA: Diagnosis not present

## 2023-04-25 DIAGNOSIS — F17210 Nicotine dependence, cigarettes, uncomplicated: Secondary | ICD-10-CM | POA: Diagnosis present

## 2023-04-25 DIAGNOSIS — F32A Depression, unspecified: Secondary | ICD-10-CM | POA: Diagnosis present

## 2023-04-25 DIAGNOSIS — D849 Immunodeficiency, unspecified: Secondary | ICD-10-CM | POA: Diagnosis not present

## 2023-04-25 DIAGNOSIS — I82402 Acute embolism and thrombosis of unspecified deep veins of left lower extremity: Secondary | ICD-10-CM | POA: Diagnosis present

## 2023-04-25 DIAGNOSIS — Z8049 Family history of malignant neoplasm of other genital organs: Secondary | ICD-10-CM

## 2023-04-25 DIAGNOSIS — Z91048 Other nonmedicinal substance allergy status: Secondary | ICD-10-CM

## 2023-04-25 DIAGNOSIS — Z885 Allergy status to narcotic agent status: Secondary | ICD-10-CM

## 2023-04-25 LAB — COMPREHENSIVE METABOLIC PANEL WITH GFR
ALT: 30 U/L (ref 0–44)
AST: 61 U/L — ABNORMAL HIGH (ref 15–41)
Albumin: 2.1 g/dL — ABNORMAL LOW (ref 3.5–5.0)
Alkaline Phosphatase: 520 U/L — ABNORMAL HIGH (ref 38–126)
Anion gap: 15 (ref 5–15)
BUN: 12 mg/dL (ref 6–20)
CO2: 26 mmol/L (ref 22–32)
Calcium: 8.6 mg/dL — ABNORMAL LOW (ref 8.9–10.3)
Chloride: 87 mmol/L — ABNORMAL LOW (ref 98–111)
Creatinine, Ser: 0.47 mg/dL (ref 0.44–1.00)
GFR, Estimated: >60 mL/min (ref >60)
Glucose, Bld: 95 mg/dL (ref 70–99)
Potassium: 3.8 mmol/L (ref 3.5–5.1)
Sodium: 128 mmol/L — ABNORMAL LOW (ref 135–145)
Total Bilirubin: 3 mg/dL — ABNORMAL HIGH (ref 0.0–1.2)
Total Protein: 6.2 g/dL — ABNORMAL LOW (ref 6.5–8.1)

## 2023-04-25 LAB — URINALYSIS, ROUTINE W REFLEX MICROSCOPIC
Bacteria, UA: NONE SEEN
Glucose, UA: NEGATIVE mg/dL
Ketones, ur: 5 mg/dL — AB
Nitrite: NEGATIVE
Protein, ur: >=300 mg/dL — AB
Specific Gravity, Urine: 1.023 (ref 1.005–1.030)
WBC, UA: >50 WBC/hpf (ref 0–5)
pH: 5 (ref 5.0–8.0)

## 2023-04-25 LAB — CBC WITH DIFFERENTIAL/PLATELET
Abs Immature Granulocytes: 0.27 10*3/uL — ABNORMAL HIGH (ref 0.00–0.07)
Basophils Absolute: 0.1 10*3/uL (ref 0.0–0.1)
Basophils Relative: 1 %
Eosinophils Absolute: 0 10*3/uL (ref 0.0–0.5)
Eosinophils Relative: 0 %
HCT: 33.7 % — ABNORMAL LOW (ref 36.0–46.0)
Hemoglobin: 11 g/dL — ABNORMAL LOW (ref 12.0–15.0)
Immature Granulocytes: 2 %
Lymphocytes Relative: 16 %
Lymphs Abs: 2 10*3/uL (ref 0.7–4.0)
MCH: 38.3 pg — ABNORMAL HIGH (ref 26.0–34.0)
MCHC: 32.6 g/dL (ref 30.0–36.0)
MCV: 117.4 fL — ABNORMAL HIGH (ref 80.0–100.0)
Monocytes Absolute: 1.8 10*3/uL — ABNORMAL HIGH (ref 0.1–1.0)
Monocytes Relative: 15 %
Neutro Abs: 7.8 10*3/uL — ABNORMAL HIGH (ref 1.7–7.7)
Neutrophils Relative %: 66 %
Platelets: 217 10*3/uL (ref 150–400)
RBC: 2.87 MIL/uL — ABNORMAL LOW (ref 3.87–5.11)
RDW: 20.6 % — ABNORMAL HIGH (ref 11.5–15.5)
WBC: 11.9 10*3/uL — ABNORMAL HIGH (ref 4.0–10.5)
nRBC: 0.3 % — ABNORMAL HIGH (ref 0.0–0.2)

## 2023-04-25 LAB — LIPASE, BLOOD: Lipase: 21 U/L (ref 11–51)

## 2023-04-25 LAB — LACTIC ACID, PLASMA
Lactic Acid, Venous: 1.6 mmol/L (ref 0.5–1.9)
Lactic Acid, Venous: 1.3 mmol/L (ref 0.5–1.9)

## 2023-04-25 LAB — MAGNESIUM: Magnesium: 1.6 mg/dL — ABNORMAL LOW (ref 1.7–2.4)

## 2023-04-25 MED ORDER — ONDANSETRON HCL 4 MG/2ML IJ SOLN
4.0000 mg | Freq: Once | INTRAMUSCULAR | Status: AC
  Administered 2023-04-25: 4 mg via INTRAVENOUS
  Filled 2023-04-25: qty 2

## 2023-04-25 MED ORDER — HEPARIN SODIUM (PORCINE) 5000 UNIT/ML IJ SOLN
5000.0000 [IU] | Freq: Two times a day (BID) | INTRAMUSCULAR | Status: DC
  Administered 2023-04-25 – 2023-04-26 (×2): 5000 [IU] via SUBCUTANEOUS
  Filled 2023-04-25 (×2): qty 1

## 2023-04-25 MED ORDER — ONDANSETRON HCL 4 MG PO TABS
4.0000 mg | ORAL_TABLET | Freq: Four times a day (QID) | ORAL | Status: DC | PRN
  Administered 2023-04-26: 4 mg via ORAL
  Filled 2023-04-25: qty 1

## 2023-04-25 MED ORDER — ACETAMINOPHEN 650 MG RE SUPP: 650.0000 mg | Freq: Four times a day (QID) | RECTAL | Status: DC | PRN

## 2023-04-25 MED ORDER — DIAZEPAM 5 MG PO TABS
5.0000 mg | ORAL_TABLET | Freq: Every evening | ORAL | Status: DC | PRN
  Administered 2023-04-26: 5 mg via ORAL
  Filled 2023-04-25: qty 1

## 2023-04-25 MED ORDER — SODIUM CHLORIDE 0.9 % IV BOLUS
1000.0000 mL | Freq: Once | INTRAVENOUS | Status: AC
  Administered 2023-04-25: 1000 mL via INTRAVENOUS

## 2023-04-25 MED ORDER — CHLORHEXIDINE GLUCONATE CLOTH 2 % EX PADS
6.0000 | MEDICATED_PAD | Freq: Every day | CUTANEOUS | Status: DC
  Administered 2023-04-25 – 2023-04-27 (×3): 6 via TOPICAL

## 2023-04-25 MED ORDER — OXYCODONE-ACETAMINOPHEN 5-325 MG PO TABS
1.0000 | ORAL_TABLET | Freq: Once | ORAL | Status: AC
  Administered 2023-04-25: 1 via ORAL
  Filled 2023-04-25: qty 1

## 2023-04-25 MED ORDER — LACTATED RINGERS IV SOLN: INTRAVENOUS | Status: DC

## 2023-04-25 MED ORDER — OXYCODONE HCL 5 MG PO TABS
20.0000 mg | ORAL_TABLET | ORAL | Status: DC
  Administered 2023-04-25 – 2023-04-27 (×9): 20 mg via ORAL
  Filled 2023-04-25 (×9): qty 4

## 2023-04-25 MED ORDER — MIDODRINE HCL 5 MG PO TABS
5.0000 mg | ORAL_TABLET | Freq: Two times a day (BID) | ORAL | Status: DC
  Administered 2023-04-25 – 2023-04-26 (×2): 5 mg via ORAL
  Filled 2023-04-25 (×2): qty 1

## 2023-04-25 MED ORDER — DIAZEPAM 5 MG PO TABS: 5.0000 mg | ORAL_TABLET | Freq: Every day | ORAL | Status: DC

## 2023-04-25 MED ORDER — SODIUM CHLORIDE 0.9 % IV SOLN
2.0000 g | Freq: Once | INTRAVENOUS | Status: AC
  Administered 2023-04-25: 2 g via INTRAVENOUS
  Filled 2023-04-25: qty 20

## 2023-04-25 MED ORDER — SODIUM CHLORIDE 0.9 % IV BOLUS
2000.0000 mL | Freq: Once | INTRAVENOUS | Status: AC
  Administered 2023-04-25: 2000 mL via INTRAVENOUS

## 2023-04-25 MED ORDER — HYDROCORTISONE SOD SUC (PF) 100 MG IJ SOLR
50.0000 mg | Freq: Every day | INTRAMUSCULAR | Status: AC
  Administered 2023-04-25 – 2023-04-26 (×2): 50 mg via INTRAVENOUS
  Filled 2023-04-25 (×2): qty 2

## 2023-04-25 MED ORDER — ACETAMINOPHEN 325 MG PO TABS: 650.0000 mg | ORAL_TABLET | Freq: Four times a day (QID) | ORAL | Status: DC | PRN

## 2023-04-25 MED ORDER — ONDANSETRON HCL 4 MG/2ML IJ SOLN: 4.0000 mg | Freq: Four times a day (QID) | INTRAMUSCULAR | Status: DC | PRN

## 2023-04-25 MED ORDER — SODIUM CHLORIDE 0.9 % IV SOLN
2.0000 g | INTRAVENOUS | Status: DC
  Administered 2023-04-26 – 2023-04-27 (×2): 2 g via INTRAVENOUS
  Filled 2023-04-25 (×2): qty 20

## 2023-04-25 MED ORDER — MAGNESIUM SULFATE IN D5W 1-5 GM/100ML-% IV SOLN
1.0000 g | Freq: Once | INTRAVENOUS | Status: AC
  Administered 2023-04-25: 1 g via INTRAVENOUS
  Filled 2023-04-25: qty 100

## 2023-04-25 NOTE — H&P (Signed)
 History and Physical    Patient: Michelle Holt ZOX:[id number] DOB: 10/27/1970 DOA: 04/25/2023 DOS: the patient was seen and examined on 04/25/2023 PCP: Ignatius Specking, MD  Patient coming from: Home Chief complaint: Chief Complaint  Patient presents with   Hypotension   HPI:  Michelle Holt is a 53 y.o. 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  currently on chemotherapy infusion, recently hospitalized for small bowel obstruction ,hematochezia s/p EGD found esophageal ulcer with no bleeding, gastritis, nonbleeding duodenal ulcer  and Colonoscopy found diverticulosis, internal hemorrhoids   and left leg DVT  sp [facility] filter at [facility] no [facility] due to thrombocytopenia and GIB, with allergies to carboplatin, morphine and codeine, Nickel, nortriptyline, Topamax, Xanax, chlorpheniramine and phenylephrine cold medicine, amoxicillin, codeine, erythromycin, gemcitabine, penicillin, red dyes, Sudafed presenting today>> with nausea vomiting, generalized malaise, and also noted malodorous urine.  No reports of chest pain or shortness of breath fevers chills.   ED Course: Pt in ed at bedside  is A/O. >>ED evaluation thus far shows:  -Vitals overview since arrival show tachycardia and hypotension  - CMP shows hyponatremia of 128 chloride 87 normal kidney function normal anion gap normal bicarb magnesium ordered and pending.  LFTs show AST of 61 which is improved, ALT of 30, total bili of 3.0, alkaline phosphatase of 520. -Lactic acid of 1.6. -CBC shows leukocytosis of 11.9 hemoglobin of 11, MCV of 117.4 RDW of 20.6 and normal platelets. -Abnormal urinalysis with turbid urine small bilirubin small hemoglobin small leukocytes more than 50 WBCs with 0-5 RBCs. -lactic acid repeat ordered and pending.  -Chest xray negative for any acute process.  >>While in the ED patient received the following: Medications  sodium chloride 0.9 % bolus 2,000  mL (2,000 mLs Intravenous New Bag/Given 04/25/23 1743)  ondansetron (ZOFRAN) injection 4 mg (4 mg Intravenous Given 04/25/23 1743)  sodium chloride 0.9 % bolus 1,000 mL (1,000 mLs Intravenous New Bag/Given 04/25/23 1928)  cefTRIAXone (ROCEPHIN) 2 g in sodium chloride 0.9 % 100 mL IVPB (2 g Intravenous New Bag/Given 04/25/23 1930)  oxyCODONE-acetaminophen (PERCOCET/ROXICET) 5-325 MG per tablet 1 tablet (1 tablet Oral Given 04/25/23 2001)   Review of Systems  Constitutional:  Positive for malaise/fatigue.  Gastrointestinal:  Positive for nausea and vomiting. Negative for abdominal pain.  Genitourinary:  Positive for dysuria.  Neurological:  Positive for weakness.   Past Medical History:  Diagnosis Date   Anemia    Anxiety and depression    Arthritis of facet joints at multiple vertebral levels    L5-S1   Constipation    Dyslipidemia    Family history of breast cancer    Family history of uterine cancer    GERD (gastroesophageal reflux disease)    History of hiatal hernia    History of kidney stones    Insomnia    Irritable bowel syndrome    Migraine    Muscle tension headache    Neuropathy of finger    Ovarian carcinoma (HCC)    ovarian   Plantar fasciitis of right foot    Port-A-Cath in place 08/20/2018   Past Surgical History:  Procedure Laterality Date   ABDOMINAL HYSTERECTOMY     BIOPSY  10/27/2020   Procedure: BIOPSY;  Surgeon: Dolores Frame, MD;  [facility];  Service: Gastroenterology;;   BIOPSY  03/09/2023   Procedure: BIOPSY;  Surgeon: Imogene Burn, MD;  Location: [facility] ENDOSCOPY;  Service:  Gastroenterology;;   CHOLECYSTECTOMY  2008   COLONOSCOPY N/A 08/13/2013   Procedure: COLONOSCOPY;  Surgeon: Malissa Hippo, MD;  [facility];  Service: Endoscopy;  Laterality: N/A;  230-moved to 145 Ann to notify pt   COLONOSCOPY WITH PROPOFOL N/A 11/15/2020   Procedure: COLONOSCOPY WITH PROPOFOL;  Surgeon: Malissa Hippo, MD;  [facility];  Service: Endoscopy;  Laterality: N/A;  1:40   COLONOSCOPY WITH PROPOFOL N/A 03/09/2023   Procedure: COLONOSCOPY WITH PROPOFOL;  Surgeon: Imogene Burn, MD;  Location: [facility] ENDOSCOPY;  Service: Gastroenterology;  Laterality: N/A;   ESOPHAGEAL DILATION N/A 02/28/2021   Procedure: ESOPHAGEAL DILATION;  Surgeon: Malissa Hippo, MD;  [facility];  Service: Endoscopy;  Laterality: N/A;   ESOPHAGOGASTRODUODENOSCOPY     ESOPHAGOGASTRODUODENOSCOPY (EGD) WITH PROPOFOL N/A 07/20/2018   Procedure: ESOPHAGOGASTRODUODENOSCOPY (EGD) WITH PROPOFOL;  Surgeon: Malissa Hippo, MD;  [facility];  Service: Endoscopy;  Laterality: N/A;  Possible esophageal dilation.   ESOPHAGOGASTRODUODENOSCOPY (EGD) WITH PROPOFOL N/A 10/27/2020   Procedure: ESOPHAGOGASTRODUODENOSCOPY (EGD) WITH PROPOFOL;  Surgeon: Dolores Frame, MD;  [facility];  Service: Gastroenterology;  Laterality: N/A;  2:10, pt knows to arrive at 10:15   ESOPHAGOGASTRODUODENOSCOPY (EGD) WITH PROPOFOL N/A 02/28/2021   Procedure: ESOPHAGOGASTRODUODENOSCOPY (EGD) WITH PROPOFOL;  Surgeon: Malissa Hippo, MD;  [facility];  Service: Endoscopy;  Laterality: N/A;  200 ASA 1   ESOPHAGOGASTRODUODENOSCOPY (EGD) WITH PROPOFOL N/A 03/29/2022   Procedure: ESOPHAGOGASTRODUODENOSCOPY (EGD) WITH PROPOFOL;  Surgeon: Dolores Frame, MD;  [facility];  Service: Gastroenterology;  Laterality: N/A;  2:00 pm, asa 1-2   ESOPHAGOGASTRODUODENOSCOPY (EGD) WITH PROPOFOL N/A 03/09/2023   Procedure: ESOPHAGOGASTRODUODENOSCOPY (EGD) WITH PROPOFOL;  Surgeon: Imogene Burn, MD;  Location: [facility] ENDOSCOPY;  Service: Gastroenterology;  Laterality: N/A;   GIVENS CAPSULE STUDY N/A 11/24/2020   Procedure: GIVENS CAPSULE STUDY;  Surgeon: Malissa Hippo, MD;  [facility];  Service: Endoscopy;  Laterality: N/A;  7:30   IR ANGIOGRAM SELECTIVE EACH ADDITIONAL VESSEL  08/01/2018   IR ANGIOGRAM VISCERAL  SELECTIVE  08/01/2018   IR EMBO ART  VEN HEMORR LYMPH EXTRAV  INC GUIDE ROADMAPPING  08/01/2018   IR IMAGING GUIDED PORT INSERTION  08/20/2018   IR IVC FILTER PLMT / S&I /IMG GUID/MOD SED  03/08/2023   IR PERC PLEURAL DRAIN W/INDWELL CATH W/IMG GUIDE  07/08/2018   IR THORACENTESIS ASP PLEURAL SPACE W/IMG GUIDE  07/07/2018   IR US GUIDE VASC ACCESS RIGHT  08/01/2018   PLEURAL EFFUSION DRAINAGE Left 07/13/2018   Procedure: DRAINAGE OF LOCULATED PLEURAL EFFUSION;  Surgeon: Kerin Perna, MD;  Location: [facility] OR;  Service: Thoracic;  Laterality: Left;   POLYPECTOMY  11/15/2020   Procedure: POLYPECTOMY;  Surgeon: Malissa Hippo, MD;  [facility];  Service: Endoscopy;;   REMOVAL OF PLEURAL DRAINAGE CATHETER Left 08/20/2018   Procedure: REMOVAL OF PLEURAL DRAINAGE CATHETER;  Surgeon: Kerin Perna, MD;  Location: [facility] OR;  Service: Thoracic;  Laterality: Left;   REMOVAL OF PLEURAL DRAINAGE CATHETER Left 08/20/2018   Procedure: REMOVAL OF PLEURAL DRAINAGE CATHETER;  Surgeon: Kerin Perna, MD;  Location: [facility] OR;  Service: Thoracic;  Laterality: Left;   TALC PLEURODESIS Left 07/13/2018   Procedure: Talc Pleuradesis;  Surgeon: Donata Clay, Theron Arista, MD;  Location: [facility] OR;  Service: Thoracic;  Laterality: Left;   TOTAL HIP ARTHROPLASTY Left 06/05/2020   Procedure: LEFT TOTAL HIP ARTHROPLASTY ANTERIOR APPROACH;  Surgeon: Tarry Kos, MD;  Location: [facility] OR;  Service: Orthopedics;  Laterality: Left;  3-C   TUBAL LIGATION Bilateral    UTERINE ABLATION     VIDEO ASSISTED THORACOSCOPY Left 07/13/2018   Procedure: VIDEO ASSISTED THORACOSCOPY;  Surgeon: Kerin Perna, MD;  Location: [facility] OR;  Service: Thoracic;  Laterality: Left;    reports that she has been smoking cigarettes. She started smoking about 21 years ago. She has a 8.7 pack-year smoking history. She has never used smokeless tobacco. She reports that she does not currently use alcohol after a past usage of about 1.0 standard drink of alcohol per week.  She reports that she does not use drugs. Allergies  Allergen Reactions   Carboplatin Shortness Of Breath    SOB and flushing of the skin.   Morphine And Codeine Itching   Nickel Itching   Nortriptyline Other (See Comments)    Significant weight gain   Topamax [Topiramate] Diarrhea and Nausea Only   Xanax [Alprazolam] Other (See Comments)    "Can't wake up"   Actifed Cold-Allergy [Chlorpheniramine-Phenyleph Er] Rash and Other (See Comments)    Red dye only   Amoxicillin Rash   Codeine Hives   Erythromycin Rash   Gemzar [Gemcitabine] Other (See Comments)    Feeling flushed, red in the face   Penicillins Rash        Red Dye Rash   Sudafed [Pseudoephedrine Hcl] Rash and Other (See Comments)    Red dye only   Family History  Problem Relation Age of Onset   Depression Mother    Hypertension Mother    Obesity Mother    Diabetes Mother    Kidney disease Mother    Peripheral vascular disease Father    Atrial fibrillation Father    Crohn's disease Sister    Uterine cancer Sister 44       maternal half sister   COPD Brother    Osteoporosis Brother    Breast cancer Maternal Aunt 50   Colon cancer Neg Hx    Prior to Admission medications   Medication Sig Start Date End Date Taking? Authorizing Provider  diazepam (VALIUM) 5 MG tablet Take 1 tablet (5 mg total) by mouth at bedtime. 09/05/22  Yes Doreatha Massed, MD  dicyclomine (BENTYL) 20 MG tablet Take 1 tablet (20 mg total) by mouth every 6 (six) hours as needed for spasms. 01/07/23  Yes Doreatha Massed, MD  ondansetron (ZOFRAN-ODT) 4 MG disintegrating tablet Take 1 tablet (4 mg total) by mouth every 8 (eight) hours as needed for nausea or vomiting. 01/22/23  Yes Doreatha Massed, MD  Oxycodone HCl 20 MG TABS Take 1 tablet (20 mg total) by mouth every 4 (four) hours. 04/24/23  Yes Doreatha Massed, MD  pantoprazole (PROTONIX) 40 MG tablet Take 1 tablet (40 mg total) by mouth 2 (two) times daily before a meal.  03/10/23 06/02/23 Yes Noralee Stain, DO                                                                                 Vitals:   04/25/23 1714 04/25/23 2045 04/25/23 2100 04/25/23 2235  BP: 97/78 (!) 76/40 (!) 81/64 (!) 83/42  Pulse: (!) 103 89 89 96  Resp: 14   16  Temp: 98.7 F (37.1 C)   (!) 97.3 F (36.3 C)  TempSrc: Oral   Oral  SpO2: 91% 98% 98% 95%  Weight:    64.6 kg  Height:    5\' 4"  (1.626 m)   Physical Exam Vitals and nursing note reviewed.  Constitutional:      General: She is not in acute distress.    Appearance: She is ill-appearing.  HENT:     Head: Normocephalic and atraumatic.     Right Ear: Hearing normal.     Left Ear: Hearing normal.     Nose: Nose normal. No nasal deformity.     Mouth/Throat:     Lips: Pink.     Tongue: No lesions.  Eyes:     Extraocular Movements: Extraocular movements intact.  Cardiovascular:     Rate and Rhythm: Normal rate and regular rhythm.     Heart sounds: Normal heart sounds.  Pulmonary:     Effort: Pulmonary effort is normal.     Breath sounds: Normal breath sounds.  Abdominal:     General: Bowel sounds are normal. There is no distension.     Palpations: Abdomen is soft. There is no mass.     Tenderness: There is no abdominal tenderness.  Musculoskeletal:     Right lower leg: No edema.     Left lower leg: No edema.  Skin:    General: Skin is warm.  Neurological:     General: No focal deficit present.     Mental Status: She is alert and oriented to person, place, and time.     Cranial Nerves: Cranial nerves 2-12 are intact.  Psychiatric:        Attention and Perception: Attention normal.        Mood and Affect: Mood normal.        Speech: Speech normal.        Behavior: Behavior normal. Behavior is cooperative.     Labs on Admission: I have personally reviewed following labs and imaging studies CBC: Recent Labs  Lab 04/25/23 1752  WBC 11.9*  NEUTROABS 7.8*  HGB 11.0*  HCT 33.7*  MCV 117.4*  PLT 217    Basic Metabolic Panel: Recent Labs  Lab 04/25/23 1752  NA 128*  K 3.8  CL 87*  CO2 26  GLUCOSE 95  BUN 12  CREATININE 0.47  CALCIUM 8.6*  MG 1.6*   GFR: Estimated Creatinine Clearance: 70.2 mL/min (by C-G formula based on SCr of 0.47 mg/dL). Liver Function Tests: Recent Labs  Lab 04/25/23 1752  AST 61*  ALT 30  ALKPHOS 520*  BILITOT 3.0*  PROT 6.2*  ALBUMIN 2.1*   Recent Labs  Lab 04/25/23 2124  LIPASE 21   No results for input(s): "AMMONIA" in the last 168 hours. Coagulation Profile: No results for input(s): "INR", "PROTIME" in the last 168 hours. Cardiac Enzymes: No results for input(s): "CKTOTAL", "CKMB", "CKMBINDEX", "TROPONINI" in the last 168 hours. BNP (last 3 results) No results for input(s): "PROBNP" in the last 8760 hours. HbA1C: No results for input(s): "HGBA1C" in the last 72 hours. CBG: No results for input(s): "GLUCAP" in the last 168 hours. Lipid Profile: No results for input(s): "CHOL", "HDL", "LDLCALC", "TRIG", "CHOLHDL", "LDLDIRECT" in the last 72 hours. Thyroid Function Tests: No results for input(s): "TSH", "T4TOTAL", "FREET4", "T3FREE", "THYROIDAB" in the last 72 hours. Anemia Panel: No results for input(s): "VITAMINB12", "FOLATE", "FERRITIN", "TIBC", "IRON", "  RETICCTPCT" in the last 72 hours. Urine analysis:    Component Value Date/Time   COLORURINE YELLOW 04/25/2023 1746   APPEARANCEUR TURBID (A) 04/25/2023 1746   APPEARANCEUR Clear 08/02/2021 1036   LABSPEC 1.023 04/25/2023 1746   PHURINE 5.0 04/25/2023 1746   GLUCOSEU NEGATIVE 04/25/2023 1746   HGBUR SMALL (A) 04/25/2023 1746   BILIRUBINUR SMALL (A) 04/25/2023 1746   BILIRUBINUR Negative 08/02/2021 1036   KETONESUR 5 (A) 04/25/2023 1746   PROTEINUR >=300 (A) 04/25/2023 1746   NITRITE NEGATIVE 04/25/2023 1746   LEUKOCYTESUR SMALL (A) 04/25/2023 1746   Radiological Exams on Admission: DG Chest Port 1 View Result Date: 04/25/2023 CLINICAL DATA:  Weakness.  History of  ovarian cancer. EXAM: PORTABLE CHEST 1 VIEW COMPARISON:  Chest radiograph dated 02/09/2023. FINDINGS: The heart size and mediastinal contours are within normal limits. Stable right chest Port-A-Cath tip overlies the lower SVC. No focal consolidation, pleural effusion, or pneumothorax. No acute osseous abnormality identified. IMPRESSION: No acute cardiopulmonary findings. Electronically Signed   By: Hart Robinsons M.D.   On: 04/25/2023 18:51   Data Reviewed: Relevant notes from primary care and specialist visits, past discharge summaries as available in EHR, including Care Everywhere. Prior diagnostic testing as pertinent to current admission diagnoses, Updated medications and problem lists for reconciliation ED course, including vitals, labs, imaging, treatment and response to treatment,Triage notes, nursing and pharmacy notes and ED provider's notes Notable results as noted in HPI.Discussed case with EDMD/ ED APP/ or Specialty MD on call and as needed.  Assessment & Plan  >>N/V : -2/2 chemo infusion. -admit to tele. -cont with MIVF LR at 100.  -replace electrolytes.  -antiemetics/ IV PPI  and Prn ativan.  -EKG : today shows sinus tach at 104 right axis deviation and low voltage in the precordial leads, nonspecific T wave changes and inferior lateral leads.  QTc 421.  >>UTI: Cont with rocephin and follow culture.  Cont with IVF rehydration.   >>Hypotension: 2/2 to volume loss and dehydration and also pain meds. Will start low dose midodrine when pt can take po. Cont LR at 100cc x 24 hours.   >>Ovarian cancer: PRN pain meds.   >>DVT: Heparin 5000 q12h.   >>Anemia  Stable , monitor for any bleeding or symptoms of.    >>Thrombocytopenia: Resolved will start DVT prophylaxis.    DVT prophylaxis:  Compression socks.   Consults:  None Advance Care Planning:    Code Status: Do not attempt resuscitation (DNR) PRE-ARREST INTERVENTIONS DESIRED   Family Communication:  None   Disposition Plan:  Home  Severity of Illness: The appropriate patient status for this patient is OBSERVATION. Observation status is judged to be reasonable and necessary in order to provide the required intensity of service to ensure the patient's safety. The patient's presenting symptoms, physical exam findings, and initial radiographic and laboratory data in the context of their medical condition is felt to place them at decreased risk for further clinical deterioration. Furthermore, it is anticipated that the patient will be medically stable for discharge from the hospital within 2 midnights of admission.   Unresulted Labs (From admission, onward)     Start     Ordered   04/26/23 0500  Comprehensive metabolic panel  Tomorrow morning,   R        04/25/23 2114   04/26/23 0500  CBC  Tomorrow morning,   R        04/25/23 2114   04/25/23 2153  MRSA Next Gen by PCR,  Nasal  Once,   R        04/25/23 2152            Orders Placed This Encounter  Procedures   Blood culture (routine x 2)   MRSA Next Gen by PCR, Nasal   DG Chest Port 1 View   CBC with Differential   Comprehensive metabolic panel   Urinalysis, Routine w reflex microscopic -Urine, Clean Catch   Lactic acid, plasma   Magnesium   Lipase, blood   Comprehensive metabolic panel   CBC   Diet clear liquid Room service appropriate? Yes; Fluid consistency: Thin   Catherize if unable to void   Strict intake and output   Cardiac Monitoring Continuous x 48 hours Indications for use: Other; Other indications for use: Hypotension.   Vital signs   Notify physician (specify)   Mobility Protocol: No Restrictions RN to initiate protocols based on patient's level of care   Refer to Sidebar Report Refer to ICU, Med-Surg, Progressive, and Step-Down Mobility Protocol Sidebars   Initiate Adult Central Line Maintenance and Catheter Protocol for patients with central line (CVC, PICC, Port, Hemodialysis, Trialysis)   Daily weights   Intake  and Output   Do not place and if present remove PureWick   Initiate Oral Care Protocol   Initiate Carrier Fluid Protocol   RN may order General Admission PRN Orders utilizing "General Admission PRN medications" (through manage orders) for the following patient needs: allergy symptoms (Claritin), cold sores (Carmex), cough (Robitussin DM), eye irritation (Liquifilm Tears), hemorrhoids (Tucks), indigestion (Maalox), minor skin irritation (Hydrocortisone Cream), muscle pain Romeo Apple Gay), nose irritation (saline nasal spray) and sore throat (Chloraseptic spray).   Refer to Sidebar Report - CHG cloths Sidebar   Patient Education: - [facility] Daily CHG Bathing   Place [name] hose   Do not attempt resuscitation (DNR) Pre-Arrest Interventions Desired   Consult to hospitalist   Pulse oximetry check with vital signs   Oxygen therapy Mode or (Route): Nasal cannula; Liters Per Minute: 2; Keep O2 saturation between: greater than 92 %   EKG 12-Lead   EKG 12-Lead   Insert saline lock   Place in observation (patient's expected length of stay will be less than 2 midnights)   Aspiration precautions   Fall precautions    Author: Gertha Calkin, MD 04/25/2023 11:00 PM >>Please note for any concern,or critical results after hours  please contact the [facility] hospitalist MD floor coverage provider from 7 PM- 7 AM. For on call review [email or web address], username [email or web address] and PW: your phone number<<

## 2023-04-25 NOTE — Care Management Obs Status (Signed)
 MEDICARE OBSERVATION STATUS NOTIFICATION   Patient Details  Name: Michelle Holt MRN: [id number] Date of Birth: [birth year]   Medicare Observation Status Notification Given:  Yes    Barron Alvine, RN 04/25/2023, 9:27 PM

## 2023-04-25 NOTE — TOC Initial Note (Signed)
 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   Pt has been encouraged to enroll in home hospice.   Expected Discharge Plan: Home/Self Care (Home c/family support.) Barriers to Discharge: Continued Medical Work up   Patient Goals and CMS Choice Patient states their goals for this hospitalization and ongoing recovery are:: To return home.          Expected Discharge Plan and Services In-house Referral: Clinical Social Work Discharge Planning Services: CM Consult   Living arrangements for the past 2 months: Single Family Home                                      Prior Living Arrangements/Services Living arrangements for the past 2 months: Single Family Home Lives with:: Self Patient language and need for interpreter reviewed:: Yes Do you feel safe going back to the place where you live?: Yes      Need for Family Participation in Patient Care: Yes (Comment) Care giver support system in place?: Yes (comment)   Criminal Activity/Legal Involvement Pertinent to Current Situation/Hospitalization: No - Comment as needed  Activities of Daily Living      Permission Sought/Granted Permission sought to share information with : Facility Industrial/product designer granted to share information with : Yes, Verbal Permission Granted              Emotional Assessment   Attitude/Demeanor/Rapport: Engaged Affect (typically observed):  Appropriate Orientation: : Oriented to Self, Oriented to Place, Oriented to  Time, Oriented to Situation Alcohol / Substance Use: Not Applicable Psych Involvement: No (comment)  Admission diagnosis:  Dehydration [E86.0] Acute cystitis without hematuria [N30.00] Nausea and vomiting [R11.2] Patient Active Problem List   Diagnosis Date Noted   Nausea and vomiting 04/25/2023   Protein-calorie malnutrition, severe 03/09/2023   Left leg DVT (HCC) 03/08/2023   Diarrhea 03/08/2023   DVT (deep venous thrombosis) (HCC) 03/07/2023   Malnutrition of moderate degree 02/11/2023   Hypoalbuminemia due to protein-calorie malnutrition (HCC) 02/10/2023   Failure to thrive in adult 02/10/2023   SBO (small bowel obstruction) (HCC) 02/09/2023   Ascites, malignant 01/14/2023   Cancer associated pain 12/24/2022   Peritoneal carcinomatosis (HCC) 12/15/2022   Abdominal pain 12/15/2022   Malignant neoplasm of ovary (HCC) 12/15/2022   Nausea with vomiting 02/11/2022   Mixed stress and urge urinary incontinence 08/02/2021   Microscopic hematuria 08/02/2021   Palliative care by specialist    Goals of care, counseling/discussion    General weakness    Leukopenia 06/23/2021   Thrombocytopenia (HCC) 06/23/2021   Elevated d-dimer 06/23/2021   Low back pain 06/23/2021   Hiatal hernia 06/23/2021   Obesity (BMI 30-39.9) 06/23/2021   Acute bronchospasm 06/23/2021   CAP (community acquired pneumonia) 06/22/2021   GERD (gastroesophageal reflux disease)    Iron deficiency anemia 11/07/2020   Post-op pain 07/12/2020   Splenic lesion 07/12/2020  Status post total replacement of left hip 06/05/2020   Primary osteoarthritis of left hip 02/09/2020   Pain in right knee 02/09/2020   Palliative care patient 09/18/2018   Port-A-Cath in place 08/20/2018   Genetic testing 08/20/2018   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   Splenic laceration 07/31/2018   Family history of uterine cancer     Family history of breast cancer    Macrocytic anemia 07/18/2018   GI bleed 07/18/2018   Ovarian cancer, bilateral (HCC) 07/18/2018   HCAP (healthcare-associated pneumonia) 07/18/2018   Pleural effusion 07/07/2018   Migraine 07/06/2018   Pleural effusion on left 07/06/2018   Acute respiratory failure with hypoxia (HCC) 07/06/2018   Tobacco abuse 07/06/2018   Generalized anxiety disorder 05/02/2014   Constipation 08/10/2013   Lumbosacral spondylosis without myelopathy 10/23/2011   Intractable migraine without aura 10/23/2011   PCP:  Ignatius Specking, MD Pharmacy:   [facility] - [place], [place] - [address] [place] [address] Phone: [phone] Fax: [phone]     Social Drivers of Health (SDOH) Social History: SDOH Screenings   Food Insecurity: No Food Insecurity (02/10/2023)  Housing: Low Risk  (02/10/2023)  Transportation Needs: No Transportation Needs (02/10/2023)  Utilities: Not At Risk (02/10/2023)  Depression (PHQ2-9): High Risk (03/11/2022)  Financial Resource Strain: Low Risk  (08/26/2022)   Received from [facility]  Physical Activity: Inactive (07/22/2018)  Social Connections: Moderately Isolated (07/22/2018)  Stress: Stress Concern Present (07/22/2018)  Tobacco Use: High Risk (04/25/2023)   SDOH Interventions:     Readmission Risk Interventions    02/10/2023   11:05 AM  Readmission Risk Prevention Plan  Transportation Screening Complete  HRI or Home Care Consult Complete  Social Work Consult for Recovery Care Planning/Counseling Complete  Palliative Care Screening Complete  Medication Review Oceanographer) Complete

## 2023-04-25 NOTE — ED Triage Notes (Signed)
 Pt BIB [facility] from home due to weakness and hypotension. Pt currently going through tx for ovarian cancer and had tx 15 dqays ago and stated that she has been sick since. Pt also stated that she has a new odor coming from her urine and believes she may have a UTI due to dehydration

## 2023-04-25 NOTE — ED Notes (Signed)
 ED TO INPATIENT HANDOFF REPORT  ED Nurse Name and Phone #: Taheerah Guldin  S Name/Age/Gender Michelle [place] 53 y.o. female Room/Bed: APA07/APA07  Code Status   Code Status: Full Code  Home/SNF/Other Home Patient oriented to: self, place, time, and situation Is this baseline? Yes   Triage Complete: Triage complete  Chief Complaint Nausea and vomiting [R11.2]  Triage Note Pt BIB [facility] from home due to weakness and hypotension. Pt currently going through tx for ovarian cancer and had tx 15 dqays ago and stated that she has been sick since. Pt also stated that she has a new odor coming from her urine and believes she may have a UTI due to dehydration   Allergies Allergies  Allergen Reactions   Carboplatin Shortness Of Breath    SOB and flushing of the skin.   Morphine And Codeine Itching   Nickel Itching   Nortriptyline Other (See Comments)    Significant weight gain   Topamax [Topiramate] Diarrhea and Nausea Only   Xanax [Alprazolam] Other (See Comments)    "Can't wake up"   Actifed Cold-Allergy [Chlorpheniramine-Phenyleph Er] Rash and Other (See Comments)    Red dye only   Amoxicillin Rash   Codeine Hives   Erythromycin Rash   Gemzar [Gemcitabine] Other (See Comments)    Feeling flushed, red in the face   Penicillins Rash        Red Dye Rash   Sudafed [Pseudoephedrine Hcl] Rash and Other (See Comments)    Red dye only    Level of Care/Admitting Diagnosis ED Disposition     ED Disposition  Admit   Condition  --   Comment  Hospital Area: [facility] [[id number]]  Level of Care: Stepdown [14]  Covid Evaluation: Asymptomatic - no recent exposure (last 10 days) testing not required  Diagnosis: Nausea and vomiting [744752]  Admitting Physician: Darrold Junker  Attending Physician: Darrold Junker          B Medical/Surgery History Past Medical History:  Diagnosis Date   Anemia    Anxiety and depression    Arthritis of facet joints at  multiple vertebral levels    L5-S1   Constipation    Dyslipidemia    Family history of breast cancer    Family history of uterine cancer    GERD (gastroesophageal reflux disease)    History of hiatal hernia    History of kidney stones    Insomnia    Irritable bowel syndrome    Migraine    Muscle tension headache    Neuropathy of finger    Ovarian carcinoma (HCC)    ovarian   Plantar fasciitis of right foot    Port-A-Cath in place 08/20/2018   Past Surgical History:  Procedure Laterality Date   ABDOMINAL HYSTERECTOMY     BIOPSY  10/27/2020   Procedure: BIOPSY;  Surgeon: Dolores Frame, MD;  [facility];  Service: Gastroenterology;;   BIOPSY  03/09/2023   Procedure: BIOPSY;  Surgeon: Imogene Burn, MD;  Location: [facility] ENDOSCOPY;  Service: Gastroenterology;;   CHOLECYSTECTOMY  2008   COLONOSCOPY N/A 08/13/2013   Procedure: COLONOSCOPY;  Surgeon: Malissa Hippo, MD;  [facility];  Service: Endoscopy;  Laterality: N/A;  230-moved to 145 Ann to notify pt   COLONOSCOPY WITH PROPOFOL N/A 11/15/2020   Procedure: COLONOSCOPY WITH PROPOFOL;  Surgeon: Malissa Hippo, MD;  [facility];  Service: Endoscopy;  Laterality: N/A;  1:40  COLONOSCOPY WITH PROPOFOL N/A 03/09/2023   Procedure: COLONOSCOPY WITH PROPOFOL;  Surgeon: Imogene Burn, MD;  Location: [facility] ENDOSCOPY;  Service: Gastroenterology;  Laterality: N/A;   ESOPHAGEAL DILATION N/A 02/28/2021   Procedure: ESOPHAGEAL DILATION;  Surgeon: Malissa Hippo, MD;  [facility];  Service: Endoscopy;  Laterality: N/A;   ESOPHAGOGASTRODUODENOSCOPY     ESOPHAGOGASTRODUODENOSCOPY (EGD) WITH PROPOFOL N/A 07/20/2018   Procedure: ESOPHAGOGASTRODUODENOSCOPY (EGD) WITH PROPOFOL;  Surgeon: Malissa Hippo, MD;  [facility];  Service: Endoscopy;  Laterality: N/A;  Possible esophageal dilation.   ESOPHAGOGASTRODUODENOSCOPY (EGD) WITH PROPOFOL N/A 10/27/2020   Procedure:  ESOPHAGOGASTRODUODENOSCOPY (EGD) WITH PROPOFOL;  Surgeon: Dolores Frame, MD;  [facility];  Service: Gastroenterology;  Laterality: N/A;  2:10, pt knows to arrive at 10:15   ESOPHAGOGASTRODUODENOSCOPY (EGD) WITH PROPOFOL N/A 02/28/2021   Procedure: ESOPHAGOGASTRODUODENOSCOPY (EGD) WITH PROPOFOL;  Surgeon: Malissa Hippo, MD;  [facility];  Service: Endoscopy;  Laterality: N/A;  200 ASA 1   ESOPHAGOGASTRODUODENOSCOPY (EGD) WITH PROPOFOL N/A 03/29/2022   Procedure: ESOPHAGOGASTRODUODENOSCOPY (EGD) WITH PROPOFOL;  Surgeon: Dolores Frame, MD;  [facility];  Service: Gastroenterology;  Laterality: N/A;  2:00 pm, asa 1-2   ESOPHAGOGASTRODUODENOSCOPY (EGD) WITH PROPOFOL N/A 03/09/2023   Procedure: ESOPHAGOGASTRODUODENOSCOPY (EGD) WITH PROPOFOL;  Surgeon: Imogene Burn, MD;  Location: [facility] ENDOSCOPY;  Service: Gastroenterology;  Laterality: N/A;   GIVENS CAPSULE STUDY N/A 11/24/2020   Procedure: GIVENS CAPSULE STUDY;  Surgeon: Malissa Hippo, MD;  [facility];  Service: Endoscopy;  Laterality: N/A;  7:30   IR ANGIOGRAM SELECTIVE EACH ADDITIONAL VESSEL  08/01/2018   IR ANGIOGRAM VISCERAL SELECTIVE  08/01/2018   IR EMBO ART  VEN HEMORR LYMPH EXTRAV  INC GUIDE ROADMAPPING  08/01/2018   IR IMAGING GUIDED PORT INSERTION  08/20/2018   IR IVC FILTER PLMT / S&I /IMG GUID/MOD SED  03/08/2023   IR PERC PLEURAL DRAIN W/INDWELL CATH W/IMG GUIDE  07/08/2018   IR THORACENTESIS ASP PLEURAL SPACE W/IMG GUIDE  07/07/2018   IR US GUIDE VASC ACCESS RIGHT  08/01/2018   PLEURAL EFFUSION DRAINAGE Left 07/13/2018   Procedure: DRAINAGE OF LOCULATED PLEURAL EFFUSION;  Surgeon: Kerin Perna, MD;  Location: [facility] OR;  Service: Thoracic;  Laterality: Left;   POLYPECTOMY  11/15/2020   Procedure: POLYPECTOMY;  Surgeon: Malissa Hippo, MD;  [facility];  Service: Endoscopy;;   REMOVAL OF PLEURAL DRAINAGE CATHETER Left 08/20/2018   Procedure: REMOVAL OF PLEURAL  DRAINAGE CATHETER;  Surgeon: Kerin Perna, MD;  Location: [facility] OR;  Service: Thoracic;  Laterality: Left;   REMOVAL OF PLEURAL DRAINAGE CATHETER Left 08/20/2018   Procedure: REMOVAL OF PLEURAL DRAINAGE CATHETER;  Surgeon: Kerin Perna, MD;  Location: [facility] OR;  Service: Thoracic;  Laterality: Left;   TALC PLEURODESIS Left 07/13/2018   Procedure: Talc Pleuradesis;  Surgeon: Donata Clay, Theron Arista, MD;  Location: [facility] OR;  Service: Thoracic;  Laterality: Left;   TOTAL HIP ARTHROPLASTY Left 06/05/2020   Procedure: LEFT TOTAL HIP ARTHROPLASTY ANTERIOR APPROACH;  Surgeon: Tarry Kos, MD;  Location: [facility] OR;  Service: Orthopedics;  Laterality: Left;  3-C   TUBAL LIGATION Bilateral    UTERINE ABLATION     VIDEO ASSISTED THORACOSCOPY Left 07/13/2018   Procedure: VIDEO ASSISTED THORACOSCOPY;  Surgeon: Kerin Perna, MD;  Location: [facility] OR;  Service: Thoracic;  Laterality: Left;     A IV Location/Drains/Wounds Patient Lines/Drains/Airways Status     Active  Line/Drains/Airways     Name Placement date Placement time Site Days   Implanted Port 08/20/18 Right Chest 08/20/18  1335  Chest  1709   Implanted Port 04/25/23 Right Chest Double Power 04/25/23  1722  Chest  less than 1   Pressure Injury 02/10/23 Buttocks Left Stage 2 -  Partial thickness loss of dermis presenting as a shallow open injury with a red, pink wound bed without slough. 02/10/23  0342  -- 74            Intake/Output Last 24 hours No intake or output data in the 24 hours ending 04/25/23 2132  Labs/Imaging Results for orders placed or performed during the hospital encounter of 04/25/23 (from the past 48 hours)  Urinalysis, Routine w reflex microscopic -Urine, Clean Catch     Status: Abnormal   Collection Time: 04/25/23  5:46 PM  Result Value Ref Range   Color, Urine YELLOW YELLOW   APPearance TURBID (A) CLEAR   Specific Gravity, Urine 1.023 1.005 - 1.030   pH 5.0 5.0 - 8.0   Glucose, UA NEGATIVE NEGATIVE mg/dL   Hgb urine  dipstick SMALL (A) NEGATIVE   Bilirubin Urine SMALL (A) NEGATIVE   Ketones, ur 5 (A) NEGATIVE mg/dL   Protein, ur >=536 (A) NEGATIVE mg/dL   Nitrite NEGATIVE NEGATIVE   Leukocytes,Ua SMALL (A) NEGATIVE   RBC / HPF 0-5 0 - 5 RBC/hpf   WBC, UA >50 0 - 5 WBC/hpf   Bacteria, UA NONE SEEN NONE SEEN   Squamous Epithelial / HPF 0-5 0 - 5 /HPF   WBC Clumps PRESENT    Mucus PRESENT     Comment: Performed at [place] [facility], [address], [place], [place] [address]  CBC with Differential     Status: Abnormal   Collection Time: 04/25/23  5:52 PM  Result Value Ref Range   WBC 11.9 (H) 4.0 - 10.5 K/uL   RBC 2.87 (L) 3.87 - 5.11 MIL/uL   Hemoglobin 11.0 (L) 12.0 - 15.0 g/dL   HCT 47.4 (L) 25.9 - 56.3 %   MCV 117.4 (H) 80.0 - 100.0 fL   MCH 38.3 (H) 26.0 - 34.0 pg   MCHC 32.6 30.0 - 36.0 g/dL   RDW 87.5 (H) 64.3 - 32.9 %   Platelets 217 150 - 400 K/uL   nRBC 0.3 (H) 0.0 - 0.2 %   Neutrophils Relative % 66 %   Neutro Abs 7.8 (H) 1.7 - 7.7 K/uL   Lymphocytes Relative 16 %   Lymphs Abs 2.0 0.7 - 4.0 K/uL   Monocytes Relative 15 %   Monocytes Absolute 1.8 (H) 0.1 - 1.0 K/uL   Eosinophils Relative 0 %   Eosinophils Absolute 0.0 0.0 - 0.5 K/uL   Basophils Relative 1 %   Basophils Absolute 0.1 0.0 - 0.1 K/uL   Immature Granulocytes 2 %   Abs Immature Granulocytes 0.27 (H) 0.00 - 0.07 K/uL    Comment: Performed at [facility], [address], [place], [place] [address]  Comprehensive metabolic panel     Status: Abnormal   Collection Time: 04/25/23  5:52 PM  Result Value Ref Range   Sodium 128 (L) 135 - 145 mmol/L   Potassium 3.8 3.5 - 5.1 mmol/L   Chloride 87 (L) 98 - 111 mmol/L   CO2 26 22 - 32 mmol/L   Glucose, Bld 95 70 - 99 mg/dL    Comment: Glucose reference range applies only to samples taken after fasting for at least  8 hours.   BUN 12 6 - 20 mg/dL   Creatinine, Ser 8.29 0.44 - 1.00 mg/dL   Calcium 8.6 (L) 8.9 - 10.3 mg/dL   Total Protein 6.2 (L) 6.5 - 8.1 g/dL   Albumin 2.1 (L) 3.5  - 5.0 g/dL   AST 61 (H) 15 - 41 U/L   ALT 30 0 - 44 U/L   Alkaline Phosphatase 520 (H) 38 - 126 U/L   Total Bilirubin 3.0 (H) 0.0 - 1.2 mg/dL   GFR, Estimated >56 >21 mL/min    Comment: (NOTE) Calculated using the CKD-EPI Creatinine Equation (2021)    Anion gap 15 5 - 15    Comment: Performed at [facility], [address] [place], [place] [address]  Magnesium     Status: Abnormal   Collection Time: 04/25/23  5:52 PM  Result Value Ref Range   Magnesium 1.6 (L) 1.7 - 2.4 mg/dL    Comment: Performed at [facility], [address] [place], [place] [address]  Blood culture (routine x 2)     Status: None (Preliminary result)   Collection Time: 04/25/23  7:40 PM   Specimen: Vein; Blood  Result Value Ref Range   Specimen Description BLOOD LEFT ANTECUBITAL    Special Requests      AEROBIC BOTTLE ONLY Blood Culture adequate volume Performed at [facility], [address], [place], [place] [address]    Culture PENDING    Report Status PENDING   Blood culture (routine x 2)     Status: None (Preliminary result)   Collection Time: 04/25/23  7:40 PM   Specimen: Vein; Blood  Result Value Ref Range   Specimen Description BLOOD RIGHT ANTECUBITAL    Special Requests      BOTTLES DRAWN AEROBIC AND ANAEROBIC Blood Culture adequate volume Performed at [facility], [address], [place], [place] [address]    Culture PENDING    Report Status PENDING   Lactic acid, plasma     Status: None   Collection Time: 04/25/23  7:40 PM  Result Value Ref Range   Lactic Acid, Venous 1.6 0.5 - 1.9 mmol/L    Comment: Performed at [facility], [address], [place], [place] [address]   *Note: Due to a large number of results and/or encounters for the requested time period, some results have not been displayed. A complete set of results can be found in Results Review.   DG Chest Port 1 View Result Date: 04/25/2023 CLINICAL DATA:  Weakness.  History of ovarian cancer. EXAM: PORTABLE CHEST 1 VIEW  COMPARISON:  Chest radiograph dated 02/09/2023. FINDINGS: The heart size and mediastinal contours are within normal limits. Stable right chest Port-A-Cath tip overlies the lower SVC. No focal consolidation, pleural effusion, or pneumothorax. No acute osseous abnormality identified. IMPRESSION: No acute cardiopulmonary findings. Electronically Signed   By: Hart Robinsons M.D.   On: 04/25/2023 18:51    Pending Labs Unresulted Labs (From admission, onward)     Start     Ordered   04/26/23 0500  Comprehensive metabolic panel  Tomorrow morning,   R        04/25/23 2114   04/26/23 0500  CBC  Tomorrow morning,   R        04/25/23 2114   04/25/23 2103  Lipase, blood  Add-on,   AD        04/25/23 2102   04/25/23 1914  Lactic acid, plasma  (Lactic Acid)  Now then every 2 hours,  R      04/25/23 1915            Vitals/Pain Today's Vitals   04/25/23 1714 04/25/23 2001 04/25/23 2045 04/25/23 2100  BP: 97/78  (!) 76/40 (!) 81/64  Pulse: (!) 103  89 89  Resp: 14     Temp: 98.7 F (37.1 C)     TempSrc: Oral     SpO2: 91%  98% 98%  PainSc: 0-No pain 8       Isolation Precautions No active isolations  Medications Medications  oxyCODONE (Oxy IR/ROXICODONE) immediate release tablet 20 mg (has no administration in time range)  cefTRIAXone (ROCEPHIN) 2 g in sodium chloride 0.9 % 100 mL IVPB (has no administration in time range)  diazepam (VALIUM) tablet 5 mg (has no administration in time range)  lactated ringers infusion (has no administration in time range)  acetaminophen (TYLENOL) tablet 650 mg (has no administration in time range)    Or  acetaminophen (TYLENOL) suppository 650 mg (has no administration in time range)  ondansetron (ZOFRAN) tablet 4 mg (has no administration in time range)    Or  ondansetron (ZOFRAN) injection 4 mg (has no administration in time range)  sodium chloride 0.9 % bolus 2,000 mL (2,000 mLs Intravenous New Bag/Given 04/25/23 1743)  ondansetron (ZOFRAN)  injection 4 mg (4 mg Intravenous Given 04/25/23 1743)  sodium chloride 0.9 % bolus 1,000 mL (1,000 mLs Intravenous New Bag/Given 04/25/23 1928)  cefTRIAXone (ROCEPHIN) 2 g in sodium chloride 0.9 % 100 mL IVPB (2 g Intravenous New Bag/Given 04/25/23 1930)  oxyCODONE-acetaminophen (PERCOCET/ROXICET) 5-325 MG per tablet 1 tablet (1 tablet Oral Given 04/25/23 2001)    Mobility non-ambulatory     Focused Assessments Cardiac Assessment Handoff:    Does the Patient currently have chest pain? No    R Recommendations: See Admitting Provider Note  Report given to:   Additional Notes:

## 2023-04-25 NOTE — Hospital Course (Addendum)
 Michelle [place] was admitted to the hospital with the working diagnosis of intractable nausea and vomiting.   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 About 234 hrs after chemotherapy, 2 weeks prior to admission, she had worsening nausea and vomiting, not able to tolerate po, intermittent diarrhea. Very weak and deconditioned.   On her initial physical examination her blood pressure was 97/78, HR 103, RR 14 and 02 saturation 98%,  Lungs with no wheezing or rales, heart with S1 and S2 present and regular, abdomen with no distention and non tender, no  lower extremity edema.   Na 128, K 3,8 Cl 87, bicarbonate 26, glucose 95, bun 12, cr 0,47  Mg 1,6  ALK 520, AST 61, ALT 30  Lactic acid 1,6 and 1,3 Wbc 11.9 hgb 11,0 plt 217  Urine analysis SG 1,023, protein > 300, small leukocytes and small hgb, > 50 wbc, 0-5 rbc   Chest radiograph with hypoinflation, no cardiomegaly with no infiltrates or effusions, port in place in the internal jugular vein.   EKG 104 bpm, normal axis, normal intervals, qtc 421, sinus rhythm with no significant ST segment changes, negative T wave lead III and aVF.   04/13 patient with persistent hypotension, required vasopressors, (peripheral line)

## 2023-04-25 NOTE — ED Notes (Signed)
 Pt becoming agitated due to monitor dinging . Due to low blood pressure readings. Explained to pt. We are unable to turn off the monitor or lower the volume. Pt stated" well I'm not going to lay here listing to all that dinging, I am in last stages of cancer and am Dying" This NT made pt. RN aware

## 2023-04-26 ENCOUNTER — Observation Stay (HOSPITAL_COMMUNITY)

## 2023-04-26 ENCOUNTER — Other Ambulatory Visit (HOSPITAL_COMMUNITY): Payer: Self-pay | Admitting: *Deleted

## 2023-04-26 DIAGNOSIS — E860 Dehydration: Secondary | ICD-10-CM | POA: Diagnosis present

## 2023-04-26 DIAGNOSIS — Z66 Do not resuscitate: Secondary | ICD-10-CM | POA: Diagnosis present

## 2023-04-26 DIAGNOSIS — N390 Urinary tract infection, site not specified: Secondary | ICD-10-CM

## 2023-04-26 DIAGNOSIS — R0609 Other forms of dyspnea: Secondary | ICD-10-CM

## 2023-04-26 DIAGNOSIS — R0602 Shortness of breath: Secondary | ICD-10-CM | POA: Diagnosis not present

## 2023-04-26 DIAGNOSIS — N3000 Acute cystitis without hematuria: Secondary | ICD-10-CM | POA: Diagnosis present

## 2023-04-26 DIAGNOSIS — E871 Hypo-osmolality and hyponatremia: Secondary | ICD-10-CM | POA: Diagnosis present

## 2023-04-26 DIAGNOSIS — E861 Hypovolemia: Secondary | ICD-10-CM | POA: Diagnosis present

## 2023-04-26 DIAGNOSIS — R579 Shock, unspecified: Secondary | ICD-10-CM | POA: Diagnosis present

## 2023-04-26 DIAGNOSIS — I9589 Other hypotension: Secondary | ICD-10-CM | POA: Diagnosis not present

## 2023-04-26 DIAGNOSIS — Z86718 Personal history of other venous thrombosis and embolism: Secondary | ICD-10-CM | POA: Diagnosis not present

## 2023-04-26 DIAGNOSIS — D849 Immunodeficiency, unspecified: Secondary | ICD-10-CM | POA: Diagnosis present

## 2023-04-26 DIAGNOSIS — K219 Gastro-esophageal reflux disease without esophagitis: Secondary | ICD-10-CM | POA: Diagnosis present

## 2023-04-26 DIAGNOSIS — Z95828 Presence of other vascular implants and grafts: Secondary | ICD-10-CM | POA: Diagnosis not present

## 2023-04-26 DIAGNOSIS — Z8249 Family history of ischemic heart disease and other diseases of the circulatory system: Secondary | ICD-10-CM | POA: Diagnosis not present

## 2023-04-26 DIAGNOSIS — E876 Hypokalemia: Secondary | ICD-10-CM | POA: Diagnosis present

## 2023-04-26 DIAGNOSIS — C563 Malignant neoplasm of bilateral ovaries: Secondary | ICD-10-CM

## 2023-04-26 DIAGNOSIS — D696 Thrombocytopenia, unspecified: Secondary | ICD-10-CM | POA: Diagnosis present

## 2023-04-26 DIAGNOSIS — I509 Heart failure, unspecified: Secondary | ICD-10-CM | POA: Diagnosis not present

## 2023-04-26 DIAGNOSIS — I825Y2 Chronic embolism and thrombosis of unspecified deep veins of left proximal lower extremity: Secondary | ICD-10-CM

## 2023-04-26 DIAGNOSIS — I959 Hypotension, unspecified: Secondary | ICD-10-CM | POA: Diagnosis not present

## 2023-04-26 DIAGNOSIS — D630 Anemia in neoplastic disease: Secondary | ICD-10-CM | POA: Diagnosis present

## 2023-04-26 DIAGNOSIS — Z6824 Body mass index (BMI) 24.0-24.9, adult: Secondary | ICD-10-CM | POA: Diagnosis not present

## 2023-04-26 DIAGNOSIS — R6521 Severe sepsis with septic shock: Secondary | ICD-10-CM | POA: Diagnosis present

## 2023-04-26 DIAGNOSIS — F32A Depression, unspecified: Secondary | ICD-10-CM | POA: Diagnosis present

## 2023-04-26 DIAGNOSIS — C786 Secondary malignant neoplasm of retroperitoneum and peritoneum: Secondary | ICD-10-CM | POA: Diagnosis present

## 2023-04-26 DIAGNOSIS — E43 Unspecified severe protein-calorie malnutrition: Secondary | ICD-10-CM | POA: Diagnosis present

## 2023-04-26 DIAGNOSIS — Z9221 Personal history of antineoplastic chemotherapy: Secondary | ICD-10-CM | POA: Diagnosis not present

## 2023-04-26 DIAGNOSIS — E785 Hyperlipidemia, unspecified: Secondary | ICD-10-CM | POA: Diagnosis present

## 2023-04-26 DIAGNOSIS — Z515 Encounter for palliative care: Secondary | ICD-10-CM | POA: Diagnosis not present

## 2023-04-26 DIAGNOSIS — A419 Sepsis, unspecified organism: Secondary | ICD-10-CM | POA: Diagnosis present

## 2023-04-26 DIAGNOSIS — R0989 Other specified symptoms and signs involving the circulatory and respiratory systems: Secondary | ICD-10-CM | POA: Diagnosis not present

## 2023-04-26 LAB — COMPREHENSIVE METABOLIC PANEL WITH GFR
ALT: 27 U/L (ref 0–44)
AST: 51 U/L — ABNORMAL HIGH (ref 15–41)
Albumin: 1.6 g/dL — ABNORMAL LOW (ref 3.5–5.0)
Alkaline Phosphatase: 427 U/L — ABNORMAL HIGH (ref 38–126)
Anion gap: 9 (ref 5–15)
BUN: 10 mg/dL (ref 6–20)
CO2: 24 mmol/L (ref 22–32)
Calcium: 7.7 mg/dL — ABNORMAL LOW (ref 8.9–10.3)
Chloride: 97 mmol/L — ABNORMAL LOW (ref 98–111)
Creatinine, Ser: 0.42 mg/dL — ABNORMAL LOW (ref 0.44–1.00)
GFR, Estimated: >60 mL/min (ref >60)
Glucose, Bld: 138 mg/dL — ABNORMAL HIGH (ref 70–99)
Potassium: 3.1 mmol/L — ABNORMAL LOW (ref 3.5–5.1)
Sodium: 130 mmol/L — ABNORMAL LOW (ref 135–145)
Total Bilirubin: 2.1 mg/dL — ABNORMAL HIGH (ref 0.0–1.2)
Total Protein: 5.3 g/dL — ABNORMAL LOW (ref 6.5–8.1)

## 2023-04-26 LAB — CBC
HCT: 27.6 % — ABNORMAL LOW (ref 36.0–46.0)
Hemoglobin: 9.1 g/dL — ABNORMAL LOW (ref 12.0–15.0)
MCH: 38.6 pg — ABNORMAL HIGH (ref 26.0–34.0)
MCHC: 33 g/dL (ref 30.0–36.0)
MCV: 116.9 fL — ABNORMAL HIGH (ref 80.0–100.0)
Platelets: 219 10*3/uL (ref 150–400)
RBC: 2.36 MIL/uL — ABNORMAL LOW (ref 3.87–5.11)
RDW: 20.4 % — ABNORMAL HIGH (ref 11.5–15.5)
WBC: 11.5 10*3/uL — ABNORMAL HIGH (ref 4.0–10.5)
nRBC: 0.3 % — ABNORMAL HIGH (ref 0.0–0.2)

## 2023-04-26 LAB — ECHOCARDIOGRAM COMPLETE
Area-P 1/2: 5.02 cm2
Height: 64 in
S' Lateral: 2.8 cm
Weight: 2278.67 [oz_av]

## 2023-04-26 LAB — CORTISOL: Cortisol, Plasma: 86.8 ug/dL

## 2023-04-26 LAB — MRSA NEXT GEN BY PCR, NASAL: MRSA by PCR Next Gen: NOT DETECTED

## 2023-04-26 MED ORDER — PROMETHAZINE HCL 25 MG/ML IJ SOLN
INTRAMUSCULAR | Status: AC
  Filled 2023-04-26: qty 1

## 2023-04-26 MED ORDER — DEXTROSE-SODIUM CHLORIDE 5-0.9 % IV SOLN
INTRAVENOUS | Status: AC
Start: 2023-04-26 — End: 2023-04-27

## 2023-04-26 MED ORDER — POTASSIUM CHLORIDE 10 MEQ/100ML IV SOLN
10.0000 meq | INTRAVENOUS | Status: AC
  Administered 2023-04-26 (×4): 10 meq via INTRAVENOUS
  Filled 2023-04-26 (×3): qty 100

## 2023-04-26 MED ORDER — NOREPINEPHRINE 4 MG/250ML-% IV SOLN
0.0000 ug/min | INTRAVENOUS | Status: DC
  Administered 2023-04-26: 2 ug/min via INTRAVENOUS
  Administered 2023-04-27: 9 ug/min via INTRAVENOUS
  Administered 2023-04-27: 4 ug/min via INTRAVENOUS
  Administered 2023-04-27: 28 ug/min via INTRAVENOUS
  Filled 2023-04-26 (×4): qty 250

## 2023-04-26 MED ORDER — SODIUM CHLORIDE 0.9 % IV SOLN
12.5000 mg | Freq: Four times a day (QID) | INTRAVENOUS | Status: DC | PRN
  Administered 2023-04-26 – 2023-04-27 (×2): 12.5 mg via INTRAVENOUS
  Filled 2023-04-26 (×2): qty 0.5

## 2023-04-26 MED ORDER — ENOXAPARIN SODIUM 40 MG/0.4ML IJ SOSY
40.0000 mg | PREFILLED_SYRINGE | Freq: Every day | INTRAMUSCULAR | Status: DC
  Administered 2023-04-26 – 2023-04-27 (×2): 40 mg via SUBCUTANEOUS
  Filled 2023-04-26 (×2): qty 0.4

## 2023-04-26 MED ORDER — SODIUM CHLORIDE 0.9 % IV SOLN: INTRAVENOUS | Status: DC

## 2023-04-26 MED ORDER — MIDODRINE HCL 5 MG PO TABS
10.0000 mg | ORAL_TABLET | Freq: Three times a day (TID) | ORAL | Status: DC
  Administered 2023-04-26 – 2023-04-27 (×3): 10 mg via ORAL
  Filled 2023-04-26 (×3): qty 2

## 2023-04-26 MED ORDER — MAGNESIUM SULFATE 4 GM/100ML IV SOLN
4.0000 g | Freq: Once | INTRAVENOUS | Status: AC
Start: 2023-04-26 — End: 2023-04-26
  Administered 2023-04-26: 4 g via INTRAVENOUS
  Filled 2023-04-26: qty 100

## 2023-04-26 MED ORDER — POTASSIUM CHLORIDE CRYS ER 20 MEQ PO TBCR
40.0000 meq | EXTENDED_RELEASE_TABLET | ORAL | Status: DC
  Administered 2023-04-26: 40 meq via ORAL
  Filled 2023-04-26 (×2): qty 2

## 2023-04-26 MED ORDER — BOOST / RESOURCE BREEZE PO LIQD CUSTOM
1.0000 | Freq: Three times a day (TID) | ORAL | Status: DC
  Administered 2023-04-26 (×2): 1 via ORAL

## 2023-04-26 NOTE — Plan of Care (Signed)

## 2023-04-26 NOTE — Assessment & Plan Note (Signed)
 Continue pantoprazole.

## 2023-04-26 NOTE — Assessment & Plan Note (Addendum)
 Patient continue on 5 mcg/ min norepinephrine infusion.  Echocardiogram with preserved LV systolic function, EF 55 to 60%, RV systolic function preserved, no significant valvular disease. LA and RA with normal size, no pericardial effusion.  Admission random cortisol 86.8, will recheck this morning, for now hold on stress dose steroids.    Continue IV fluids with D5 NS at 75  ml per hr.  Increase midodrine to 15 mg po tid, in order to wean off norepinephrine, will change target to MAP 60 or greater and systolic BP 90 or greater.  Continue antibiotic therapy for urinary tract infection.

## 2023-04-26 NOTE — Assessment & Plan Note (Addendum)
 Advance disease on chemotherapy She has medical immunosuppression No clinical signs of ascites.   Plan to continue supportive medical care and follow up as outpatient.  Patient is DNR    Anemia of chronic disease, malignancy related.  Hgb is 9,1, thrombocytopenia has resolved.  Follow up cell count.

## 2023-04-26 NOTE — Progress Notes (Addendum)
 Progress Note   Patient: Michelle Holt ZOX:[id number] DOB: [birth year] DOA: 04/25/2023     0 DOS: the patient was seen and examined on 04/26/2023   Brief hospital course: Michelle Holt was admitted to the hospital with the working diagnosis of intractable nausea and vomiting.   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 About 234 hrs after chemotherapy, 2 weeks prior to admission, she had worsening nausea and vomiting, not able to tolerate po, intermittent diarrhea. Very weak and deconditioned.   On her initial physical examination her blood pressure was 97/78, HR 103, RR 14 and 02 saturation 98%,  Lungs with no wheezing or rales, heart with S1 and S2 present and regular, abdomen with no distention and non tender, no  lower extremity edema.   Na 128, K 3,8 Cl 87, bicarbonate 26, glucose 95, bun 12, cr 0,47  Mg 1,6  ALK 520, AST 61, ALT 30  Lactic acid 1,6 and 1,3 Wbc 11.9 hgb 11,0 plt 217  Urine analysis SG 1,023, protein > 300, small leukocytes and small hgb, > 50 wbc, 0-5 rbc   Chest radiograph with hypoinflation, no cardiomegaly with no infiltrates or effusions, port in place in the internal jugular vein.   EKG 104 bpm, normal axis, normal intervals, qtc 421, sinus rhythm with no significant ST segment changes, negative T wave lead III and aVF.    Assessment and Plan: * Hypotension Likely due to hypovolemia, she has been placed on peripheral norepinephrine to maintain MAP of 65 mmHg.   Plan to continue IV fluids with D5 NS at 75  ml per hr.  Increase midodrine to 10 mg po tid, in order to wean off norepinephrine, ( 3mcg/min)  Check echocardiogram.  Continue antibiotic therapy for urinary tract infection.   Urinary tract infection Positive pyuria, qualifies for septic shock, present on admission.   Plan to continue  antibiotic therapy with IV ceftriaxone.  Follow up cultures, cell count and temperature curve.  Wean off norepinephrine as tolerated to MAP of 65 mmHg.   Ovarian cancer, bilateral (HCC) Advance disease on chemotherapy She has medical immunosuppression No clinical signs of ascites.   Plan to continue supportive medical care and follow up as outpatient.  Patient is DNR    Anemia of chronic disease, malignancy related.  Hgb is 9,1, thrombocytopenia has resolved.  Follow up cell count.    Hypokalemia Hypomagnesemia, hyponatremia   Renal function with serum cr at 0,42 with K at 3,1 and serum bicarbonate at 24  Na 139 Mg 1,6   Plan to change IV fluids with isotonic saline at 75 ml per hr Add 40 meq Kcl x 2 doses and 4 g mag IV sulfate  Follow up renal function and electrolytes in am.  Avoid hypotension and nephrotoxic medications   GERD (gastroesophageal reflux disease) Continue pantoprazole.   Left leg DVT (HCC) Patient with history of DVT, currently not on anticoagulation    Subjective: Patient with improvement in nausea and vomiting, continue very weak and deconditioned, her blood pressure continue to be low.   Physical Exam: Vitals:   04/26/23 0630 04/26/23 0737 04/26/23 0800 04/26/23 0828  BP: 101/69  112/65 (!) 81/61  Pulse: 86  83 99  Resp: 15  18 14   Temp:  97.7 F (36.5 C)    TempSrc:  Oral    SpO2: 98%  96% 95%  Weight:      Height:       Neurology awake and alert, deconditioned and ill looking appearing ENT with positive pallor with no icterus Cardiovascular with S1 and S2 present and regular with no gallops, rubs or murmurs No JVD Respiratory with no rales or wheezing, no rhonchi  Abdomen with mild tender to palpation with no distention and with no ascites.  Lower extremity with mild non pitting edema, [name] hose in place.   Data Reviewed:    Family Communication: no family at the bedside   Disposition: Status is: Observation The patient will  require care spanning > 2 midnights and should be moved to inpatient because: Vasopressors   Planned Discharge Destination: Home    Patient critically will on vasopressors., critical care time 45 minutes   Author: Albertus Alt, MD 04/26/2023 9:24 AM  For on call review [email or web address].

## 2023-04-26 NOTE — ED Provider Notes (Signed)
 [facility] Provider Note   CSN: [id number] Arrival date & time: 04/25/23  1657     History  Chief Complaint  Patient presents with   Hypotension    Michelle Holt is a 53 y.o. female.  Patient with ovarian cancer.  Patient received an infusion 15 days ago and ever since then she has been throwing up and laying in bed and feeling weak  The history is provided by the patient and medical records. No language interpreter was used.  Weakness Severity:  Moderate Onset quality:  Sudden Timing:  Constant Progression:  Worsening Chronicity:  Recurrent Context: not alcohol use   Relieved by:  None tried Associated symptoms: no abdominal pain, no chest pain, no cough, no diarrhea, no frequency, no headaches and no seizures        Home Medications Prior to Admission medications   Medication Sig Start Date End Date Taking? Authorizing Provider  diazepam (VALIUM) 5 MG tablet Take 1 tablet (5 mg total) by mouth at bedtime. 09/05/22  Yes Katragadda, Sreedhar, MD  dicyclomine (BENTYL) 20 MG tablet Take 1 tablet (20 mg total) by mouth every 6 (six) hours as needed for spasms. 01/07/23  Yes Katragadda, Sreedhar, MD  ondansetron (ZOFRAN-ODT) 4 MG disintegrating tablet Take 1 tablet (4 mg total) by mouth every 8 (eight) hours as needed for nausea or vomiting. 01/22/23  Yes Katragadda, Sreedhar, MD  Oxycodone HCl 20 MG TABS Take 1 tablet (20 mg total) by mouth every 4 (four) hours. 04/24/23  Yes Paulett Boros, MD  pantoprazole (PROTONIX) 40 MG tablet Take 1 tablet (40 mg total) by mouth 2 (two) times daily before a meal. 03/10/23 06/02/23 Yes Daren Eck, DO      Allergies    Carboplatin, Morphine and codeine, Nickel, Nortriptyline, Topamax [topiramate], Xanax [alprazolam], Actifed cold-allergy [chlorpheniramine-phenyleph er], Amoxicillin, Codeine, Erythromycin, Gemzar [gemcitabine], Penicillins, Red dye, and Sudafed [pseudoephedrine hcl]    Review of Systems    Review of Systems  Constitutional:  Negative for appetite change and fatigue.  HENT:  Negative for congestion, ear discharge and sinus pressure.   Eyes:  Negative for discharge.  Respiratory:  Negative for cough.   Cardiovascular:  Negative for chest pain.  Gastrointestinal:  Negative for abdominal pain and diarrhea.  Genitourinary:  Negative for frequency and hematuria.  Musculoskeletal:  Negative for back pain.  Skin:  Negative for rash.  Neurological:  Positive for weakness. Negative for seizures and headaches.  Psychiatric/Behavioral:  Negative for hallucinations.     Physical Exam Updated Vital Signs BP 135/68   Pulse 72   Temp 97.7 F (36.5 C) (Oral)   Resp 11   Ht 5\' 4"  (1.626 m)   Wt 64.6 kg   LMP  (LMP Unknown)   SpO2 97%   BMI 24.45 kg/m  Physical Exam Vitals and nursing note reviewed.  Constitutional:      Appearance: She is well-developed.  HENT:     Head: Normocephalic.     Nose: Nose normal.  Eyes:     General: No scleral icterus.    Conjunctiva/sclera: Conjunctivae normal.  Neck:     Thyroid: No thyromegaly.  Cardiovascular:     Rate and Rhythm: Normal rate and regular rhythm.     Heart sounds: No murmur heard.    No friction rub. No gallop.  Pulmonary:     Breath sounds: No stridor. No wheezing or rales.  Chest:     Chest wall: No tenderness.  Abdominal:  General: There is no distension.     Tenderness: There is no abdominal tenderness. There is no rebound.  Musculoskeletal:        General: Normal range of motion.     Cervical back: Neck supple.  Lymphadenopathy:     Cervical: No cervical adenopathy.  Skin:    Findings: No erythema or rash.  Neurological:     Mental Status: She is alert and oriented to person, place, and time.     Motor: No abnormal muscle tone.     Coordination: Coordination normal.  Psychiatric:        Behavior: Behavior normal.     ED Results / Procedures / Treatments   Labs (all labs ordered are listed, but  only abnormal results are displayed) Labs Reviewed  CBC WITH DIFFERENTIAL/PLATELET - Abnormal; Notable for the following components:      Result Value   WBC 11.9 (*)    RBC 2.87 (*)    Hemoglobin 11.0 (*)    HCT 33.7 (*)    MCV 117.4 (*)    MCH 38.3 (*)    RDW 20.6 (*)    nRBC 0.3 (*)    Neutro Abs 7.8 (*)    Monocytes Absolute 1.8 (*)    Abs Immature Granulocytes 0.27 (*)    All other components within normal limits  COMPREHENSIVE METABOLIC PANEL WITH GFR - Abnormal; Notable for the following components:   Sodium 128 (*)    Chloride 87 (*)    Calcium 8.6 (*)    Total Protein 6.2 (*)    Albumin 2.1 (*)    AST 61 (*)    Alkaline Phosphatase 520 (*)    Total Bilirubin 3.0 (*)    All other components within normal limits  URINALYSIS, ROUTINE W REFLEX MICROSCOPIC - Abnormal; Notable for the following components:   APPearance TURBID (*)    Hgb urine dipstick SMALL (*)    Bilirubin Urine SMALL (*)    Ketones, ur 5 (*)    Protein, ur >=300 (*)    Leukocytes,Ua SMALL (*)    All other components within normal limits  MAGNESIUM - Abnormal; Notable for the following components:   Magnesium 1.6 (*)    All other components within normal limits  COMPREHENSIVE METABOLIC PANEL WITH GFR - Abnormal; Notable for the following components:   Sodium 130 (*)    Potassium 3.1 (*)    Chloride 97 (*)    Glucose, Bld 138 (*)    Creatinine, Ser 0.42 (*)    Calcium 7.7 (*)    Total Protein 5.3 (*)    Albumin 1.6 (*)    AST 51 (*)    Alkaline Phosphatase 427 (*)    Total Bilirubin 2.1 (*)    All other components within normal limits  CBC - Abnormal; Notable for the following components:   WBC 11.5 (*)    RBC 2.36 (*)    Hemoglobin 9.1 (*)    HCT 27.6 (*)    MCV 116.9 (*)    MCH 38.6 (*)    RDW 20.4 (*)    nRBC 0.3 (*)    All other components within normal limits  CULTURE, BLOOD (ROUTINE X 2)  CULTURE, BLOOD (ROUTINE X 2)  MRSA NEXT GEN BY PCR, NASAL  LACTIC ACID, PLASMA  LACTIC  ACID, PLASMA  LIPASE, BLOOD  CORTISOL    EKG EKG Interpretation Date/Time:  Friday April 25 2023 17:07:26 EDT Ventricular Rate:  104 PR Interval:  139 QRS  Duration:  80 QT Interval:  320 QTC Calculation: 421 R Axis:   119  Text Interpretation: Sinus tachycardia Right axis deviation Low voltage, precordial leads Confirmed by Kelsey Patricia [phone]) on 04/26/2023 10:57:45 AM  Radiology DG Chest Port 1 View Result Date: 04/25/2023 CLINICAL DATA:  Weakness.  History of ovarian cancer. EXAM: PORTABLE CHEST 1 VIEW COMPARISON:  Chest radiograph dated 02/09/2023. FINDINGS: The heart size and mediastinal contours are within normal limits. Stable right chest Port-A-Cath tip overlies the lower SVC. No focal consolidation, pleural effusion, or pneumothorax. No acute osseous abnormality identified. IMPRESSION: No acute cardiopulmonary findings. Electronically Signed   By: Mannie Seek M.D.   On: 04/25/2023 18:51    Procedures Procedures    Medications Ordered in ED Medications  oxyCODONE (Oxy IR/ROXICODONE) immediate release tablet 20 mg (20 mg Oral Given 04/26/23 0828)  cefTRIAXone (ROCEPHIN) 2 g in sodium chloride 0.9 % 100 mL IVPB (has no administration in time range)  diazepam (VALIUM) tablet 5 mg (5 mg Oral Given 04/26/23 0142)  acetaminophen (TYLENOL) tablet 650 mg (has no administration in time range)    Or  acetaminophen (TYLENOL) suppository 650 mg (has no administration in time range)  ondansetron (ZOFRAN) tablet 4 mg (has no administration in time range)    Or  ondansetron (ZOFRAN) injection 4 mg (has no administration in time range)  Chlorhexidine Gluconate Cloth 2 % PADS 6 each (6 each Topical Given 04/26/23 0829)  norepinephrine (LEVOPHED) 4mg  in (0.016 mg/mL) premix infusion (3 mcg/min Intravenous Infusion Verify 04/26/23 0409)  feeding supplement (BOOST / RESOURCE BREEZE) liquid 1 Container (1 Container Oral Patient Refused/Not Given 04/26/23 0829)  midodrine  (PROAMATINE) tablet 10 mg (10 mg Oral Given 04/26/23 1042)  potassium chloride SA (KLOR-CON M) CR tablet 40 mEq (40 mEq Oral Given 04/26/23 1042)  magnesium sulfate IVPB 4 g 100 mL (4 g Intravenous New Bag/Given 04/26/23 1049)  dextrose 5 %-0.9 % sodium chloride infusion ( Intravenous New Bag/Given 04/26/23 1046)  enoxaparin (LOVENOX) injection 40 mg (has no administration in time range)  sodium chloride 0.9 % bolus 2,000 mL (0 mLs Intravenous Stopped 04/25/23 2134)  ondansetron (ZOFRAN) injection 4 mg (4 mg Intravenous Given 04/25/23 1743)  sodium chloride 0.9 % bolus 1,000 mL (0 mLs Intravenous Stopped 04/25/23 2134)  cefTRIAXone (ROCEPHIN) 2 g in sodium chloride 0.9 % 100 mL IVPB (0 g Intravenous Stopped 04/25/23 2134)  oxyCODONE-acetaminophen (PERCOCET/ROXICET) 5-325 MG per tablet 1 tablet (1 tablet Oral Given 04/25/23 2001)  magnesium sulfate IVPB 1 g 100 mL (0 g Intravenous Stopped 04/26/23 0028)  hydrocortisone sodium succinate (SOLU-CORTEF) 100 MG injection 50 mg (50 mg Intravenous Given 04/26/23 4696)    ED Course/ Medical Decision Making/ A&P  CRITICAL CARE Performed by: Cheyenne Cotta Total critical care time: 50 minutes Critical care time was exclusive of separately billable procedures and treating other patients. Critical care was necessary to treat or prevent imminent or life-threatening deterioration. Critical care was time spent personally by me on the following activities: development of treatment plan with patient and/or surrogate as well as nursing, discussions with consultants, evaluation of patient's response to treatment, examination of patient, obtaining history from patient or surrogate, ordering and performing treatments and interventions, ordering and review of laboratory studies, ordering and review of radiographic studies, pulse oximetry and re-evaluation of patient's condition.  Medical Decision Making Amount and/or Complexity of Data  Reviewed Labs: ordered. Radiology: ordered. ECG/medicine tests: ordered.  Risk Prescription drug management. Decision regarding hospitalization.   Patient with dehydration, UTI, hypotension.  She is admitted to medicine        Final Clinical Impression(s) / ED Diagnoses Final diagnoses:  Acute cystitis without hematuria  Dehydration    Rx / DC Orders ED Discharge Orders     None         Cheyenne Cotta, MD 04/26/23 1155

## 2023-04-26 NOTE — Assessment & Plan Note (Signed)
 Patient with history of DVT, currently not on anticoagulation

## 2023-04-26 NOTE — Assessment & Plan Note (Addendum)
 Positive pyuria, qualifies for septic shock, present on admission.   Plan to continue antibiotic therapy with IV ceftriaxone.  Follow up cultures, cell count and temperature curve.  Wean off norepinephrine as tolerated to MAP of 65 mmHg.

## 2023-04-26 NOTE — Progress Notes (Signed)
*  PRELIMINARY RESULTS* Echocardiogram 2D Echocardiogram has been performed.  Michelle Holt 04/26/2023, 12:31 PM

## 2023-04-26 NOTE — Plan of Care (Signed)
  Problem: Education: Goal: Knowledge of General Education information will improve Description: Including pain rating scale, medication(s)/side effects and non-pharmacologic comfort measures Outcome: Progressing   Problem: Health Behavior/Discharge Planning: Goal: Ability to manage health-related needs will improve Outcome: Progressing   Problem: Clinical Measurements: Goal: Ability to maintain clinical measurements within normal limits will improve Outcome: Progressing Goal: Diagnostic test results will improve Outcome: Progressing Goal: Respiratory complications will improve Outcome: Progressing Goal: Cardiovascular complication will be avoided Outcome: Progressing   Problem: Coping: Goal: Level of anxiety will decrease Outcome: Progressing   Problem: Elimination: Goal: Will not experience complications related to bowel motility Outcome: Progressing Goal: Will not experience complications related to urinary retention Outcome: Progressing   Problem: Pain Managment: Goal: General experience of comfort will improve and/or be controlled Outcome: Progressing   Problem: Safety: Goal: Ability to remain free from injury will improve Outcome: Progressing   Problem: Skin Integrity: Goal: Risk for impaired skin integrity will decrease Outcome: Progressing   Problem: Clinical Measurements: Goal: Will remain free from infection Outcome: Not Progressing   Problem: Activity: Goal: Risk for activity intolerance will decrease Outcome: Not Progressing   Problem: Nutrition: Goal: Adequate nutrition will be maintained Outcome: Not Progressing

## 2023-04-26 NOTE — Assessment & Plan Note (Addendum)
 Hypomagnesemia, hyponatremia   Renal function with serum cr at 0,42 with K at 3,1 and serum bicarbonate at 24  Na 139 Mg 1,6   Plan to change IV fluids with isotonic saline at 75 ml per hr Add 40 meq Kcl x 2 doses and 4 g mag IV sulfate  Follow up renal function and electrolytes in am.  Avoid hypotension and nephrotoxic medications

## 2023-04-26 NOTE — Progress Notes (Signed)
*  PRELIMINARY RESULTS* Echocardiogram 2D Echocardiogram has been performed.  Bernis Brisker 04/26/2023, 12:31 PM

## 2023-04-27 ENCOUNTER — Inpatient Hospital Stay (HOSPITAL_COMMUNITY)

## 2023-04-27 DIAGNOSIS — C563 Malignant neoplasm of bilateral ovaries: Secondary | ICD-10-CM | POA: Diagnosis not present

## 2023-04-27 DIAGNOSIS — I9589 Other hypotension: Secondary | ICD-10-CM | POA: Diagnosis not present

## 2023-04-27 DIAGNOSIS — N390 Urinary tract infection, site not specified: Secondary | ICD-10-CM | POA: Diagnosis not present

## 2023-04-27 DIAGNOSIS — E876 Hypokalemia: Secondary | ICD-10-CM | POA: Diagnosis not present

## 2023-04-27 LAB — CORTISOL: Cortisol, Plasma: 19.7 ug/dL

## 2023-04-27 LAB — BASIC METABOLIC PANEL WITH GFR
Anion gap: 10 (ref 5–15)
BUN: 10 mg/dL (ref 6–20)
CO2: 19 mmol/L — ABNORMAL LOW (ref 22–32)
Calcium: 7.9 mg/dL — ABNORMAL LOW (ref 8.9–10.3)
Chloride: 101 mmol/L (ref 98–111)
Creatinine, Ser: 0.61 mg/dL (ref 0.44–1.00)
GFR, Estimated: >60 mL/min (ref >60)
Glucose, Bld: 168 mg/dL — ABNORMAL HIGH (ref 70–99)
Potassium: 4.5 mmol/L (ref 3.5–5.1)
Sodium: 130 mmol/L — ABNORMAL LOW (ref 135–145)

## 2023-04-27 LAB — CBC
HCT: 27.7 % — ABNORMAL LOW (ref 36.0–46.0)
Hemoglobin: 9.1 g/dL — ABNORMAL LOW (ref 12.0–15.0)
MCH: 39.2 pg — ABNORMAL HIGH (ref 26.0–34.0)
MCHC: 32.9 g/dL (ref 30.0–36.0)
MCV: 119.4 fL — ABNORMAL HIGH (ref 80.0–100.0)
Platelets: 239 10*3/uL (ref 150–400)
RBC: 2.32 MIL/uL — ABNORMAL LOW (ref 3.87–5.11)
RDW: 19.9 % — ABNORMAL HIGH (ref 11.5–15.5)
WBC: 15.2 10*3/uL — ABNORMAL HIGH (ref 4.0–10.5)
nRBC: 0.4 % — ABNORMAL HIGH (ref 0.0–0.2)

## 2023-04-27 LAB — MAGNESIUM: Magnesium: 2.3 mg/dL (ref 1.7–2.4)

## 2023-04-27 MED ORDER — SODIUM CHLORIDE 0.9 % IV SOLN: INTRAVENOUS | Status: DC

## 2023-04-27 MED ORDER — VANCOMYCIN HCL 1500 MG/300ML IV SOLN
1500.0000 mg | Freq: Once | INTRAVENOUS | Status: DC
Start: 2023-04-28 — End: 2023-04-28

## 2023-04-27 MED ORDER — PHENYLEPHRINE HCL-NACL 20-0.9 MG/250ML-% IV SOLN
0.0000 ug/min | INTRAVENOUS | Status: DC
  Administered 2023-04-27: 20 ug/min via INTRAVENOUS
  Filled 2023-04-27: qty 250

## 2023-04-27 MED ORDER — VANCOMYCIN HCL 750 MG/150ML IV SOLN: 750.0000 mg | Freq: Two times a day (BID) | INTRAVENOUS | Status: DC

## 2023-04-27 MED ORDER — SODIUM CHLORIDE 0.9 % IV SOLN: 2.0000 g | Freq: Three times a day (TID) | INTRAVENOUS | Status: DC

## 2023-04-27 MED ORDER — SODIUM CHLORIDE 0.9 % IV BOLUS
500.0000 mL | Freq: Once | INTRAVENOUS | Status: AC
  Administered 2023-04-27: 500 mL via INTRAVENOUS

## 2023-04-27 MED ORDER — HYDROMORPHONE HCL 1 MG/ML IJ SOLN
1.0000 mg | INTRAMUSCULAR | Status: DC | PRN
  Administered 2023-04-27 (×2): 1 mg via INTRAVENOUS
  Filled 2023-04-27 (×2): qty 1

## 2023-04-27 MED ORDER — VASOPRESSIN 20 UNITS/100 ML INFUSION FOR SHOCK
0.0000 [IU]/min | INTRAVENOUS | Status: DC
  Administered 2023-04-27: 0.03 [IU]/min via INTRAVENOUS
  Filled 2023-04-27: qty 100

## 2023-04-27 MED ORDER — ONDANSETRON HCL 4 MG/2ML IJ SOLN
4.0000 mg | Freq: Four times a day (QID) | INTRAMUSCULAR | Status: DC | PRN
  Administered 2023-04-27 (×2): 4 mg via INTRAVENOUS
  Filled 2023-04-27 (×2): qty 2

## 2023-04-27 MED ORDER — HYDROCORTISONE SOD SUC (PF) 100 MG IJ SOLR
INTRAMUSCULAR | Status: AC
Start: 2023-04-27 — End: 2023-04-27
  Administered 2023-04-27: 100 mg via INTRAVENOUS
  Filled 2023-04-27: qty 2

## 2023-04-27 MED ORDER — SODIUM CHLORIDE 0.9 % IV BOLUS
500.0000 mL | Freq: Once | INTRAVENOUS | Status: AC
Start: 2023-04-27 — End: 2023-04-27
  Administered 2023-04-27: 500 mL via INTRAVENOUS

## 2023-04-27 MED ORDER — MIDODRINE HCL 5 MG PO TABS
15.0000 mg | ORAL_TABLET | Freq: Three times a day (TID) | ORAL | Status: DC
  Administered 2023-04-27: 15 mg via ORAL
  Filled 2023-04-27: qty 3

## 2023-04-27 MED ORDER — HYDROCORTISONE SOD SUC (PF) 100 MG IJ SOLR: 100.0000 mg | Freq: Three times a day (TID) | INTRAMUSCULAR | Status: DC

## 2023-04-27 NOTE — Plan of Care (Signed)

## 2023-04-27 NOTE — Progress Notes (Signed)
 eLink Physician-Brief Progress Note Patient Name: Michelle Holt DOB: 11-23-1970 MRN: [id number]   Date of Service  04/27/2023  HPI/Events of Note  Called by hospitalist to discuss case. Worsening shock of unclear etiology. Is on 30 mic levophed and vasopressin. BP on monitor reading SBP 50. However she appears awake and is placing her nasal cannula on. Bedside had ordered fluids already. Last labs this AM   eICU Interventions  Would get art line for accurate measurement  HR is in 140s Escalate antibiotics to vanc and cefepime and start stress steroids Check labs including lactate  DNR status noted and would like to get imaging to see what we are dealing with but need to stabilize first MD going to bedside to evaluate     Intervention Category Major Interventions: Hypotension - evaluation and management  Luiz Sakai 04/27/2023, 11:38 PM

## 2023-04-27 NOTE — Plan of Care (Signed)

## 2023-04-27 NOTE — Progress Notes (Addendum)
 Progress Note   Patient: Michelle Holt WUJ:[id number] DOB: [birth year] DOA: 04/25/2023     1 DOS: the patient was seen and examined on 04/27/2023   Brief hospital course: Mrs. Plude was admitted to the hospital with the working diagnosis of intractable nausea and vomiting.   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 About 234 hrs after chemotherapy, 2 weeks prior to admission, she had worsening nausea and vomiting, not able to tolerate po, intermittent diarrhea. Very weak and deconditioned.   On her initial physical examination her blood pressure was 97/78, HR 103, RR 14 and 02 saturation 98%,  Lungs with no wheezing or rales, heart with S1 and S2 present and regular, abdomen with no distention and non tender, no  lower extremity edema.   Na 128, K 3,8 Cl 87, bicarbonate 26, glucose 95, bun 12, cr 0,47  Mg 1,6  ALK 520, AST 61, ALT 30  Lactic acid 1,6 and 1,3 Wbc 11.9 hgb 11,0 plt 217  Urine analysis SG 1,023, protein > 300, small leukocytes and small hgb, > 50 wbc, 0-5 rbc   Chest radiograph with hypoinflation, no cardiomegaly with no infiltrates or effusions, port in place in the internal jugular vein.   EKG 104 bpm, normal axis, normal intervals, qtc 421, sinus rhythm with no significant ST segment changes, negative T wave lead III and aVF.   04/13 patient with persistent hypotension, required vasopressors, (peripheral line)   Assessment and Plan: * Hypotension Patient continue on 5 mcg/ min norepinephrine infusion.  Echocardiogram with preserved LV systolic function, EF 55 to 60%, RV systolic function preserved, no significant valvular disease. LA and RA with normal size, no pericardial effusion.  Admission random cortisol 86.8, will recheck this morning, for now hold on stress dose steroids.    Continue IV fluids  with D5 NS at 75  ml per hr.  Increase midodrine to 15 mg po tid, in order to wean off norepinephrine, will change target to MAP 60 or greater and systolic BP 90 or greater.  Continue antibiotic therapy for urinary tract infection.   Urinary tract infection Positive pyuria, qualifies for septic shock, present on admission.   Plan to continue antibiotic therapy with IV ceftriaxone.  Cell count is 15.2  Blood cultures with no growth.   Ovarian cancer, bilateral (HCC) Advance disease on chemotherapy She has medical immunosuppression No clinical signs of ascites.   Plan to continue supportive medical care and follow up as outpatient.  Patient is DNR    Anemia of chronic disease, malignancy related.  Hgb is 9,1, thrombocytopenia has resolved.  Follow up cell count.    Hypokalemia Hypomagnesemia, hyponatremia   Follow up renal function with serum cr at 0,61 with K at 4,5 and serum bicarbonate at 19  Na 130 and Mg 2.3   Plan to continue IV fluids with isotonic saline and follow up renal function and electrolytes in am.   GERD (gastroesophageal reflux disease) Continue pantoprazole.   Protein-calorie malnutrition, severe Continue with nutritional supplements  Left leg DVT (HCC) Patient with history of DVT, currently not on anticoagulation    Subjective: Patient continue very weak and deconditioned, no chest pain or dyspnea, poor oral intake   Physical Exam: Vitals:   04/27/23 0615 04/27/23 0630 04/27/23 0700 04/27/23 0730  BP: 96/71 91/63 92/66  (!) 93/51  Pulse: 84 86 89 90  Resp: 11 11 10 13   Temp:   97.8 F (36.6 C)   TempSrc:   Oral   SpO2: 98% 98% 97% 97%  Weight:      Height:       Neurology awake and alert, deconditioned and ill looking appearing  ENT with mild pallor with no icterus Cardiovascular with S1 and S2 present and regular with no gallops, rubs or murmurs Respiratory with no rales or wheezing, no rhonchi  Abdomen with no distention  No lower  extremity edema  Data Reviewed:  Family Communication: no family at the bedside   Disposition: Status is: Inpatient Remains inpatient appropriate because: IV vasopressors.   Planned Discharge Destination: Home    Patient critically ill vasopressor dependent, critical care time 45 minutes.  Author: Albertus Alt, MD 04/27/2023 10:38 AM  For on call review [email or web address].

## 2023-04-27 NOTE — Progress Notes (Signed)
 Pharmacy Antibiotic Note  Michelle Holt is a 53 y.o. female admitted on 04/25/2023 with intractable N/V and concerns for sepsis.  Pharmacy has been consulted for cefepime/vancomycin dosing.  -Ceftriaxone 2g IV x3 days for UTI -WBC increase from 11 > 15 (on steroids), afebrile, sCr 0.62 (bl ~0.4) -Blood cultures collected  Plan: -Cefepime 2g IV every 8 hours -Vancomycin 1500mg  IV x1 -Vancomycin 750mg  IV every 12 hours (AUC 492, Vd 0.72, IBW, sCr 0.8) -Monitor renal function -Follow up signs of clinical improvement, LOT, de-escalation of antibiotics   Height: 5\' 4"  (162.6 cm) Weight: 67.5 kg (148 lb 13 oz) IBW/kg (Calculated) : 54.7  Temp (24hrs), Avg:97.9 F (36.6 C), Min:97.7 F (36.5 C), Max:98.2 F (36.8 C)  Recent Labs  Lab 04/25/23 1752 04/25/23 1940 04/25/23 2124 04/26/23 0446 04/27/23 0345  WBC 11.9*  --   --  11.5* 15.2*  CREATININE 0.47  --   --  0.42* 0.61  LATICACIDVEN  --  1.6 1.3  --   --     Estimated Creatinine Clearance: 76.8 mL/min (by C-G formula based on SCr of 0.61 mg/dL).    Antimicrobials this admission: Ceftriaxone 4/11 >> 4/13 Cefepime 4/14 >>  Vancomycin 4/14 >>  Microbiology results: 4/11 BCx: ngtd 4/11 MRSA PCR: neg  Thank you for allowing pharmacy to be a part of this patient's care.  Young Hensen, PharmD. Clinical Pharmacist 04/27/2023 11:38 PM

## 2023-04-27 NOTE — Progress Notes (Signed)
 Spoke to MD Arrien regarding pt tachycardia/ Remains tachy 130s-140s despite antiemetic and IV pain control. ST, EKG completed around 1345. Remains hypotensive on levo, unable to take po medication due to nausea. Pt reports no pain now just general malaise and cramping. Plan for IV steroid medication and IV fluid resuscitation.

## 2023-04-27 NOTE — Assessment & Plan Note (Signed)
 Continue with nutritional supplements.

## 2023-04-28 DIAGNOSIS — R579 Shock, unspecified: Secondary | ICD-10-CM | POA: Diagnosis not present

## 2023-04-28 MED ORDER — MORPHINE 100MG IN NS 100ML (1MG/ML) PREMIX INFUSION: 1.0000 mg/h | INTRAVENOUS | Status: DC

## 2023-04-28 MED ORDER — MORPHINE 100MG IN NS 100ML (1MG/ML) PREMIX INFUSION
INTRAVENOUS | Status: AC
Start: 2023-04-28 — End: 2023-04-28
  Administered 2023-04-28: 1 mg/h via INTRAVENOUS
  Filled 2023-04-28: qty 100

## 2023-04-30 LAB — CULTURE, BLOOD (ROUTINE X 2)
Culture: NO GROWTH
Culture: NO GROWTH
Special Requests: ADEQUATE
Special Requests: ADEQUATE

## 2023-05-01 ENCOUNTER — Ambulatory Visit (INDEPENDENT_AMBULATORY_CARE_PROVIDER_SITE_OTHER): Admitting: Gastroenterology

## 2023-05-07 ENCOUNTER — Inpatient Hospital Stay

## 2023-05-07 ENCOUNTER — Inpatient Hospital Stay: Admitting: Hematology

## 2023-05-15 NOTE — Death Summary Note (Signed)
 DEATH SUMMARY   Patient Details  Name: Michelle Holt MRN: [id number] DOB: 1970-05-21 NWG:NFAO, Angelina Pih, MD Admission/Discharge Information   Admit Date:  05/23/2023  Date of Death: Date of Death: 05-26-23  Time of Death: Time of Death: 06/02/2036  Length of Stay: 2   Principle Cause of death: cardiac arrest  Hospital Diagnoses: Principal Problem:   Hypotension Active Problems:   Urinary tract infection   Ovarian cancer, bilateral (HCC)   Hypokalemia   GERD (gastroesophageal reflux disease)   Protein-calorie malnutrition, severe   Left leg DVT (HCC)   Shock [facility] [place])   Hospital Course: Mrs. Pamintuan was admitted to the hospital with the working diagnosis of intractable nausea and vomiting.    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 About 234 hrs after chemotherapy, 2 weeks prior to admission, she had worsening nausea and vomiting, not able to tolerate po, intermittent diarrhea. Very weak and deconditioned.   On her initial physical examination her blood pressure was 97/78, HR 103, RR 14 and 02 saturation 98%,  Lungs with no wheezing or rales, heart with S1 and S2 present and regular, abdomen with no distention and non tender, no  lower extremity edema.    Na 128, K 3,8 Cl 87, bicarbonate 26, glucose 95, bun 12, cr 0,47  Mg 1,6  ALK 520, AST 61, ALT 30  Lactic acid 1,6 and 1,3 Wbc 11.9 hgb 11,0 plt 217  Urine analysis SG 1,023, protein > 300, small leukocytes and small hgb, > 50 wbc, 0-5 rbc    Chest radiograph with hypoinflation, no cardiomegaly with no infiltrates or effusions, port in place in the internal jugular vein.    EKG 104 bpm, normal axis, normal intervals, qtc 421, sinus rhythm with no significant ST segment changes, negative T wave lead III and aVF.    04/13 patient with persistent hypotension,  required vasopressors, (peripheral line)    AT APPROXIMATELY 19:00 ON 4/13 PTS BP CONTINUED TO DROP W/ POOR RESPONSE TO IVF AND LEVO. SO VASOPRESSIN WAS ADDED. AFTER SOME TIME PRESSURE CONTINUED TO DROP. CCM WAS CONSULTED. PTS ABX WERE BROADENED TO VANC/ZOSYN, NEO WAS ADDED FOR PRESSURE SUPPORT AND CXR AND LABS ORDERED. FAMILY WAS CONTACTED TO INFORM THEM OF PTS DETERIORATING CONDITION. EMPATHY WAS PROVIDED AND A DISCUSSION ABOUT PTS DETERIOATING CONDITION AND PLANS FOR REMOVING AGGRESSIVE MEASURES IN PLACE OF COMFORT MEASURES WAS INITIATED. FAMILY DETERMINED TO MOVE TO COMFORT MEASURES TO ALLOW PT TO PASS PEACEFULLY. TIME OF PASSING 00:38 ON 05/26/23.      Assessment and Plan: * Hypotension Patient continue on 5 mcg/ min norepinephrine infusion.  Echocardiogram with preserved LV systolic function, EF 55 to 60%, RV systolic function preserved, no significant valvular disease. LA and RA with normal size, no pericardial effusion.  Admission random cortisol 86.8, will recheck this morning, for now hold on stress dose steroids.     Continue IV fluids with D5 NS at 75  ml per hr.  Increase midodrine to 15 mg po tid, in order to wean off norepinephrine, will change target to MAP 60 or greater and systolic BP 90 or greater.  Continue antibiotic therapy for urinary tract infection.    Urinary tract infection Positive pyuria, qualifies for septic shock, present on admission.    Plan to continue antibiotic therapy with IV ceftriaxone.  Cell count is 15.2  Blood cultures with  no growth.    Ovarian cancer, bilateral (HCC) Advance disease on chemotherapy She has medical immunosuppression No clinical signs of ascites.    Plan to continue supportive medical care and follow up as outpatient.  Patient is DNR    Anemia of chronic disease, malignancy related.  Hgb is 9,1, thrombocytopenia has resolved.  Follow up cell count.      Hypokalemia Hypomagnesemia, hyponatremia    Follow up renal function  with serum cr at 0,61 with K at 4,5 and serum bicarbonate at 19  Na 130 and Mg 2.3    Plan to continue IV fluids with isotonic saline and follow up renal function and electrolytes in am.    GERD (gastroesophageal reflux disease) Continue pantoprazole.    Protein-calorie malnutrition, severe Continue with nutritional supplements   Left leg DVT (HCC) Patient with history of DVT, currently not on anticoagulation      Time spent: 75 minutes  Signed: Otilia Bloch, MD 04/29/2023

## 2023-05-15 NOTE — Progress Notes (Signed)
 Pt's BP dropping to as low as 70s systolic, HR up to 130s, titrated Levo up but HR keeps creeping up. MD informed. Midodrine dose and initial bolus of 500ml as ordered. Did helped a little bit but still has BP dropping afterwards. Vasopressin started. Still not touching patient's BP, informed MD and referred to PCCM. Still having BP readings of 50-60s systolic, HR 150s. Given additional bolus as ordered. Neo gtt started. Patient started become anxious and agitated. Tried revoke DNR status. MD at bedside. RT tried inserting A-line to no avail, weak to no pulses. Placed o2 for comfort despite having 100% sats. Called daughter to update and she came to hospital quickly. MD discussed code status with the daughter and they decided to transition pt to full comfort care. Morphine gtt started. Around 1610R, patient passed. MD and thus RN at bedside to confirm. Assisted family members at bedside.

## 2023-05-15 DEATH — deceased

## 2023-05-21 ENCOUNTER — Inpatient Hospital Stay

## 2023-05-21 ENCOUNTER — Inpatient Hospital Stay: Admitting: Hematology

## 2023-06-04 ENCOUNTER — Inpatient Hospital Stay
